# Patient Record
Sex: Male | Born: 1955
Health system: Southern US, Community
[De-identification: ages and names within clinical notes are randomized; demographics above are authoritative.]

## PROBLEM LIST (undated history)

## (undated) ENCOUNTER — Emergency Department (HOSPITAL_BASED_OUTPATIENT_CLINIC_OR_DEPARTMENT_OTHER): Admission: EM | Payer: HMO | Source: Home / Self Care

## (undated) DIAGNOSIS — F419 Anxiety disorder, unspecified: Secondary | ICD-10-CM

## (undated) DIAGNOSIS — I2510 Atherosclerotic heart disease of native coronary artery without angina pectoris: Secondary | ICD-10-CM

## (undated) DIAGNOSIS — E1110 Type 2 diabetes mellitus with ketoacidosis without coma: Secondary | ICD-10-CM

## (undated) DIAGNOSIS — K5900 Constipation, unspecified: Secondary | ICD-10-CM

## (undated) DIAGNOSIS — E119 Type 2 diabetes mellitus without complications: Secondary | ICD-10-CM

## (undated) DIAGNOSIS — H35039 Hypertensive retinopathy, unspecified eye: Secondary | ICD-10-CM

## (undated) DIAGNOSIS — L409 Psoriasis, unspecified: Secondary | ICD-10-CM

## (undated) DIAGNOSIS — E785 Hyperlipidemia, unspecified: Secondary | ICD-10-CM

## (undated) DIAGNOSIS — M1990 Unspecified osteoarthritis, unspecified site: Secondary | ICD-10-CM

## (undated) DIAGNOSIS — F32A Depression, unspecified: Secondary | ICD-10-CM

## (undated) DIAGNOSIS — I509 Heart failure, unspecified: Secondary | ICD-10-CM

## (undated) DIAGNOSIS — E11319 Type 2 diabetes mellitus with unspecified diabetic retinopathy without macular edema: Secondary | ICD-10-CM

## (undated) DIAGNOSIS — H269 Unspecified cataract: Secondary | ICD-10-CM

## (undated) DIAGNOSIS — F329 Major depressive disorder, single episode, unspecified: Secondary | ICD-10-CM

## (undated) DIAGNOSIS — I10 Essential (primary) hypertension: Secondary | ICD-10-CM

## (undated) DIAGNOSIS — I7100 Dissection of unspecified site of aorta: Secondary | ICD-10-CM

## (undated) HISTORY — DX: Type 2 diabetes mellitus with unspecified diabetic retinopathy without macular edema: E11.319

## (undated) HISTORY — DX: Hyperlipidemia, unspecified: E78.5

## (undated) HISTORY — DX: Unspecified osteoarthritis, unspecified site: M19.90

## (undated) HISTORY — DX: Dissection of unspecified site of aorta: I71.00

## (undated) HISTORY — DX: Type 2 diabetes mellitus without complications: E11.9

## (undated) HISTORY — DX: Unspecified cataract: H26.9

## (undated) HISTORY — DX: Type 2 diabetes mellitus with ketoacidosis without coma: E11.10

## (undated) HISTORY — PX: COLONOSCOPY: SHX174

## (undated) HISTORY — DX: Constipation, unspecified: K59.00

## (undated) HISTORY — DX: Atherosclerotic heart disease of native coronary artery without angina pectoris: I25.10

## (undated) HISTORY — DX: Essential (primary) hypertension: I10

## (undated) HISTORY — DX: Hypertensive retinopathy, unspecified eye: H35.039

## (undated) HISTORY — DX: Psoriasis, unspecified: L40.9

## (undated) HISTORY — DX: Depression, unspecified: F32.A

## (undated) HISTORY — DX: Anxiety disorder, unspecified: F41.9

## (undated) HISTORY — PX: ANKLE SURGERY: SHX546

## (undated) HISTORY — DX: Heart failure, unspecified: I50.9

---
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
## 1997-08-12 ENCOUNTER — Other Ambulatory Visit: Admission: RE | Admit: 1997-08-12 | Discharge: 1997-08-12 | Payer: Self-pay | Admitting: Gastroenterology

## 1998-10-03 ENCOUNTER — Ambulatory Visit (HOSPITAL_COMMUNITY): Admission: RE | Admit: 1998-10-03 | Discharge: 1998-10-03 | Payer: Self-pay | Admitting: General Surgery

## 2001-09-25 ENCOUNTER — Inpatient Hospital Stay (HOSPITAL_COMMUNITY): Admission: RE | Admit: 2001-09-25 | Discharge: 2001-10-05 | Payer: Self-pay | Admitting: Cardiology

## 2001-09-25 HISTORY — PX: CARDIAC CATHETERIZATION: SHX172

## 2001-09-26 ENCOUNTER — Encounter: Payer: Self-pay | Admitting: Cardiothoracic Surgery

## 2001-09-30 ENCOUNTER — Encounter: Payer: Self-pay | Admitting: Cardiothoracic Surgery

## 2001-09-30 HISTORY — PX: CORONARY ARTERY BYPASS GRAFT: SHX141

## 2001-10-01 ENCOUNTER — Encounter: Payer: Self-pay | Admitting: Thoracic Surgery (Cardiothoracic Vascular Surgery)

## 2001-10-02 ENCOUNTER — Encounter: Payer: Self-pay | Admitting: Cardiothoracic Surgery

## 2001-10-03 ENCOUNTER — Encounter: Payer: Self-pay | Admitting: Cardiothoracic Surgery

## 2001-11-03 ENCOUNTER — Encounter (HOSPITAL_COMMUNITY): Admission: RE | Admit: 2001-11-03 | Discharge: 2001-12-13 | Payer: Self-pay | Admitting: Cardiology

## 2004-09-24 ENCOUNTER — Emergency Department (HOSPITAL_COMMUNITY): Admission: EM | Admit: 2004-09-24 | Discharge: 2004-09-24 | Payer: Self-pay | Admitting: *Deleted

## 2007-03-04 ENCOUNTER — Emergency Department (HOSPITAL_COMMUNITY): Admission: EM | Admit: 2007-03-04 | Discharge: 2007-03-04 | Payer: Self-pay | Admitting: Emergency Medicine

## 2007-03-05 ENCOUNTER — Emergency Department (HOSPITAL_COMMUNITY): Admission: EM | Admit: 2007-03-05 | Discharge: 2007-03-05 | Payer: Self-pay | Admitting: Emergency Medicine

## 2007-03-06 ENCOUNTER — Emergency Department (HOSPITAL_COMMUNITY): Admission: EM | Admit: 2007-03-06 | Discharge: 2007-03-06 | Payer: Self-pay | Admitting: Emergency Medicine

## 2010-02-01 ENCOUNTER — Encounter (HOSPITAL_COMMUNITY): Payer: Self-pay

## 2010-02-01 ENCOUNTER — Encounter (HOSPITAL_COMMUNITY): Payer: PRIVATE HEALTH INSURANCE

## 2010-02-01 ENCOUNTER — Ambulatory Visit (HOSPITAL_COMMUNITY)
Admission: RE | Admit: 2010-02-01 | Discharge: 2010-02-01 | Disposition: A | Discharge status: Home / Self Care | Payer: PRIVATE HEALTH INSURANCE | Source: Ambulatory Visit | Attending: Surgery | Admitting: Surgery

## 2010-02-01 DIAGNOSIS — K4090 Unilateral inguinal hernia, without obstruction or gangrene, not specified as recurrent: Secondary | ICD-10-CM | POA: Insufficient documentation

## 2010-02-01 LAB — COMPREHENSIVE METABOLIC PANEL WITH GFR
ALT: 14 U/L (ref 0–53)
AST: 16 U/L (ref 0–37)
Albumin: 4.3 g/dL (ref 3.5–5.2)
Alkaline Phosphatase: 50 U/L (ref 39–117)
BUN: 11 mg/dL (ref 6–23)
CO2: 28 meq/L (ref 19–32)
Calcium: 9.6 mg/dL (ref 8.4–10.5)
Chloride: 95 meq/L — ABNORMAL LOW (ref 96–112)
Creatinine, Ser: 0.72 mg/dL (ref 0.4–1.5)
GFR calc non Af Amer: >60 mL/min
Glucose, Bld: 325 mg/dL — ABNORMAL HIGH (ref 70–99)
Potassium: 4.6 meq/L (ref 3.5–5.1)
Sodium: 133 meq/L — ABNORMAL LOW (ref 135–145)
Total Bilirubin: 1.2 mg/dL (ref 0.3–1.2)
Total Protein: 7.3 g/dL (ref 6.0–8.3)

## 2010-02-01 LAB — SURGICAL PCR SCREEN

## 2010-02-03 LAB — MRSA CULTURE: Culture: NO GROWTH

## 2010-02-09 ENCOUNTER — Ambulatory Visit (HOSPITAL_COMMUNITY)
Admission: RE | Admit: 2010-02-09 | Discharge: 2010-02-09 | Disposition: A | Discharge status: Home / Self Care | Payer: PRIVATE HEALTH INSURANCE | Source: Ambulatory Visit | Attending: Surgery | Admitting: Surgery

## 2010-02-09 DIAGNOSIS — E119 Type 2 diabetes mellitus without complications: Secondary | ICD-10-CM | POA: Insufficient documentation

## 2010-02-09 DIAGNOSIS — K4020 Bilateral inguinal hernia, without obstruction or gangrene, not specified as recurrent: Secondary | ICD-10-CM | POA: Insufficient documentation

## 2010-02-09 DIAGNOSIS — I2510 Atherosclerotic heart disease of native coronary artery without angina pectoris: Secondary | ICD-10-CM | POA: Insufficient documentation

## 2010-02-09 DIAGNOSIS — Z951 Presence of aortocoronary bypass graft: Secondary | ICD-10-CM | POA: Insufficient documentation

## 2010-02-09 DIAGNOSIS — I10 Essential (primary) hypertension: Secondary | ICD-10-CM | POA: Insufficient documentation

## 2010-02-09 HISTORY — PX: LAPAROSCOPIC INGUINAL HERNIA REPAIR: SUR788

## 2010-02-15 NOTE — Op Note (Signed)
NAMEHIEU, HERMS NO.:  [id number]  MEDICAL RECORD NO.:  [id number]           PATIENT TYPE:  O  LOCATION:  DAYL                         FACILITY:  [facility]  PHYSICIAN:  Abigail Miyamoto, M.D. DATE OF BIRTH:  [birth year]  DATE OF PROCEDURE:  02/09/2010 DATE OF DISCHARGE:                              OPERATIVE REPORT   PREOPERATIVE DIAGNOSIS:  Right inguinal hernia.  POSTOPERATIVE DIAGNOSIS:  Bilateral inguinal hernias.  PROCEDURE:  Laparoscopic bilateral inguinal hernia repair with mesh.  SURGEON:  Abigail Miyamoto, MD  ANESTHESIA:  General and 0.5% Marcaine.  ESTIMATED BLOOD LOSS:  Minimal.  FINDINGS:  The patient found to have bilateral direct inguinal hernias.  PROCEDURE IN DETAIL:  The patient brought to the operative room, identified as Allayne Butcher.  He was placed on the operating table and general anesthesia was induced.  His abdomen was prepped and draped in usual sterile fashion.  Using a #15 blade, a small vertical incision was made below the umbilicus.  This was carried down through the fascia. I then opened the fascia just to the right of midline with a scalpel. The rectus muscle was identified and elevated.  The dissecting balloon was then passed underneath the rectus muscle made towards the pelvis.  I then insufflated dissecting balloon, dissecting out the preperitoneal space under direct vision.  Good dissection with the dissecting balloon appeared to be achieved.  The dissecting balloon was then removed and insufflation with carbon dioxide was begun.  I then placed two 5 mm ports in the patient's lower midline under direct vision.  I first turned my attention towards the right inguinal floor.  The patient was found to have a small direct hernia defect.  There was minimal peritoneum going into the testicular cord.  I was able to reduce all the peritoneum and identify the cord well.  I was also able to easily identify Cooper ligament.   Before placing the mesh inside to evaluate his left inguinal floor and after dissecting out the area, I was able to identify a direct left inguinal area as well.  I evaluated cord structures and found no evidence of indirect hernia.  I could identify the peritoneum which I made sure was reduced from the cord.  Also I was able to identify Cooper ligament as well.  Next, I brought 2 separate 6 x 6-inch pieces of Ultrapro Prolene mesh on the field.  I placed the first piece through the umbilical port and then placed in onlay fashion on the left inguinal floor.  I then tacked it with the 3 Securestrap absorbable tacker to Cooper ligament up the medial abdominal wall and out laterally.  I then brought the second piece of mesh on to the field and cut it appropriate size.  I placed it through the umbilical port and then opened in onlay fashion on the right inguinal floor.  I then tacked it to Cooper's ligament with Securestrap tacker beneath abdominal wall and out laterally as well.  Good coverage of both inguinal areas and hernia defects appeared to be achieved.  Hemostasis also appeared to be achieved.  I removed the ports at the  midline and watched the preperitoneal space collapse appropriately.  I then removed the port of the umbilicus.  The fascia of umbilicus was then closed with figure-of- eight 0 Vicryl suture.  All incisions were then anesthetized with 0.5% Marcaine.  I performed ilioinguinal nerve blocks bilaterally with Marcaine as well.  All skin incisions were closed with 4-0 Monocryl sutures.  Steri-Strips and Band-Aids were applied.  The patient tolerated the procedure well.  All counts were correct at the end of procedure.  The patient was then extubated in operating room and taken in stable condition to recovery room.     Abigail Miyamoto, M.D.     DB/MEDQ  D:  02/09/2010  T:  02/09/2010  Job:  [id number]  Electronically Signed by Abigail Miyamoto M.D. on 02/15/2010  07:29:31 PM

## 2010-05-19 NOTE — Discharge Summary (Signed)
NAME:  [name] [name], [name] [name] NO.:  [id number]   MEDICAL RECORD NO.:  [id number]                   PATIENT TYPE:  INP   LOCATION:  2008                                 FACILITY:  [facility]   PHYSICIAN:  Gwenith Daily. Tyrone Sage, M.D.            DATE OF BIRTH:  [birth year]   DATE OF ADMISSION:  09/25/2001  DATE OF DISCHARGE:  10/05/2001                                 DISCHARGE SUMMARY   ADMISSION DIAGNOSIS:  Unstable angina.   PAST MEDICAL HISTORY:  1. Poorly controlled diabetes mellitus type 2.  2. Hyperlipidemia.   PAST SURGICAL HISTORY:  Ankle surgery x 2.   ALLERGIES:  No known drug allergies.   DISCHARGE DIAGNOSES:  Unstable angina with three-vessel coronary artery  disease, status post coronary artery bypass graft.   HISTORY OF PRESENT ILLNESS:  The patient is a 55 year old Caucasian  man. He  was  referred to Dr. Swaziland for a several week history of symptoms of  shortness of breath with exertion and chest tightness. He was evaluated in  Dr. Elvis Coil office on September 10, 2001, and his recommendation was to  proceed with a Cardiolite stress test. This was performed on September 25, 2001. He developed chest pain, ST changes and hypokinesia of the anterior  septum was evident. Dr. Elvis Coil recommendation was to admit to [facility] for cardiac catheterization.   HOSPITAL COURSE:  On September 25, 2001, the patient was admitted to  [facility] hospital under the care of Dr. Peter Swaziland. He underwent a  cardiac catheterization with the following results:  1. Severe complex three-vessel coronary artery disease.  2. Normal LV function.  As his lesions were not amenable to PTCA, a CVTS consult was obtained. He  was  evaluated later in that day by Dr. Sheliah Plane. After examining  this patient and review of the records including catheterization films, Dr.  Tyrone Sage recommended coronary artery bypass graft for treatment for this  gentleman. The procedure risks and benefits were discussed with the patient  and he agreed to proceed. Of  note prior to Dr. Dennie Maizes evaluation, the  patient had received three doses of Plavix.   On September 28, 2001, Doppler studies were performed which revealed no  significant carotid artery disease. His upper extremity  arterial evaluation  was within normal limits and his ABIs were noted to be greater than 1.0  bilaterally.   On September 30, 2001, the patient underwent an uncomplicated coronary  artery bypass grafting x 4 with Dr. Sheliah Plane. The grafts placed  during the procedure were  left internal mammary artery to the left anterior  descending artery, the right internal mammary artery to the right coronary  artery, saphenous vein graft to the diagonal artery, the radial artery was  grafted to the circumflex artery. Conduit bypasses included the left radial  artery and the  right lower extremity saphenous  vein.   The patient tolerated this procedure reasonably well and was transferred in  stable condition to the SICU. He remained hemodynamically stable from  surgery and his postoperative course has been uneventful.   Postoperatively his diabetes mellitus, his admitting hemoglobin A1C 10.7. A  Glucomander protocol was used in the immediate postoperative period. Sliding  scale insulin and Amaryl were restarted after postoperative day #1. His  postoperative CBGs were in the 150s to 100 range. Metformin was restarted on  October 03, 2001, postoperative day #3.  His volume overload responded to  diuretics.   On the morning of October 04, 2001, postoperative day #4, the patient reports  feeling very well. His vital signs were stable. His heart is in normal sinus  rhythm. His lungs are clear. He is tolerating a diabetic diet. His bowel and  bladder functions were within normal limits for him. His incisions are  healing well. He is ambulating in the hall with minimal  assistance. His pain  control was adequate. It is anticipated that the patient will be ready for  discharge home tomorrow, October 05, 2001.   LABORATORY DATA:  Recent laboratory studies on October 04, 2001:  WBCs 5.1,  hemoglobin 8.3, hematocrit 24.5, platelets 174. On October 03, 2001:  Sodium  134, potassium 3.7, chloride 94, CO2 34, BUN 8, creatinine 0.7, glucose 160.   CONDITION ON DISCHARGE:  Improved.   DISCHARGE MEDICATIONS:  1. Tylox 1 to  2 tablets q.4-6h. p.r.n. pain.  2. Toprol XL 25 mg p.o. q.d.  3. Lasix 40 mg p.o. q.d.  x 5 days.  4. Potassium chloride  20 mEq p.o. q.d. x 5 days.  5. Metformin 500 mg p.o. b.i.d.  6. Folic acid 1 mg p.o. q.d.  He has been instructed to resume the following home medications:  1. Aspirin 325 mg p.o. q.d.  2. Lipitor 20 mg p.o. q.d.  3. Amaryl 4 mg p.o. q.d.   DISCHARGE INSTRUCTIONS:  Activity, he has been asked to refrain from any  driving or any heavy lifting, pushing or pulling. He has been instructed to  continue his breathing exercises and daily walking. His diet should be a  diabetic diet. Wound care, he may shower with mild soap and water. If the  incisions become red, hot, swollen, draining or if  he has a fever greater  than 101 degrees  Farenheit, he is to call Dr. Dennie Maizes office.  Special  instructions, he is reminded to continue to check his sugars daily and  record.   FOLLOW UP:  1. Dr. Swaziland would like to see him in his office in approximately two weeks     and have a chest x-ray at that time. He has been asked to call     [facility] at [phone] to arrange this appointment.  2. He will have and appointment to see Dr. Tyrone Sage at the CVTS office in     approximately three weeks. The office will call with a date and time for     that appointment. He has been asked to bring his chest x-ray with him to     that     appointment. 3. He has also been recommended to follow up with Dr. Almedia Balls within one      month, specifically for followup of his diabetes.     Toribio Harbour, R.N.                  Gwenith Daily. Tyrone Sage,  M.D.    CTK/MEDQ  D:  10/04/2001  T:  10/08/2001  Job:  [id number]   cc:   Sam Kelly   Peter M. Swaziland, M.D.  [address]  [place], [place] [address]  Fax: [phone]

## 2010-05-19 NOTE — Op Note (Signed)
NAME:  WELCOME, FULTS NO.:  [id number]   MEDICAL RECORD NO.:  [id number]                   PATIENT TYPE:  INP   LOCATION:  2310                                 FACILITY:  [facility]   PHYSICIAN:  Gwenith Daily. Tyrone Sage, M.D.            DATE OF BIRTH:  02/10/1955   DATE OF PROCEDURE:  09/30/2001  DATE OF DISCHARGE:                                 OPERATIVE REPORT   PREOPERATIVE DIAGNOSIS:  Unstable angina with three-vessel coronary artery  disease.   POSTOPERATIVE DIAGNOSIS:  Unstable angina with three-vessel coronary artery  disease.   PROCEDURE:  Coronary artery bypass grafting x4 with the left internal  mammary to the left anterior descending coronary artery, right internal  mammary as a pedicle graft to the distal right coronary artery, left radial  artery graft to the circumflex coronary artery, reversed saphenous vein  graft to the first diagonal coronary artery.   SURGEON:  Gwenith Daily. Tyrone Sage, M.D.   ASSISTANT:  Levin Erp. Steward, P.A.   BRIEF HISTORY:  The patient is a 55 year old male with poorly-controlled  diabetes, who presented with unstable anginal symptoms.  He underwent  cardiac catheterization by Peter M. Swaziland, M.D., which demonstrated  significant three-vessel coronary artery disease including total occlusion  of the LAD, diffusely diseased right coronary artery with collateral filling  to the LAD, a high-grade stenosis of the first diagonal, a small second  diagonal with diffuse disease, and a circumflex with 60% proximal disease.  Overall ventricular function was preserved.  Because of the patient's  significant three-vessel coronary artery disease, coronary artery bypass  grafting was recommended.   DESCRIPTION OF PROCEDURE:  With Swan-Ganz and arterial line monitors in  place, the patient underwent general endotracheal anesthesia.  Preoperatively Allen's test had been checked bilaterally.  I decided to use  the patient's left  radial artery.  This was harvested, taking care to avoid  any neural injury.  The radial artery was of excellent quality and caliber.  The small perforating branches were clipped and divided.  The vessel was  hydrostatically dilated with heparinized saline and removed.  The incision  was closed with several interrupted sutures in the fascia, running 2-0  Vicryl on the subcutaneous tissue, and a 4-0 subcuticular stitch in the skin  edges.  A segment of vein was harvested from the right lower extremity and  was of good quality and caliber.  A median sternotomy was preformed.  Both  the left and the right internal mammary arteries were dissected down as  pedicle grafts and had excellent free flow.  The pericardium was opened.  The patient was systemically heparinized.  The ascending aorta and the right  atrium were cannulated and the aortic root vent cardioplegia needle was  introduced into the ascending aorta.  The patient was placed on  cardiopulmonary bypass 2.4 L/min. per sq. m, sites of anastomosis were  selected and dissected  out of the epicardium.  The patient's body  temperature was cooled to 30 degrees, the aortic crossclamp was applied, and  500 cc of cold blood potassium cardioplegia was administered with rapid  diastolic arrest of the heart.  Myocardial septal temperature was monitored  throughout the crossclamp period.  Attention was turned first to the  circumflex coronary artery, which was opened and was at least 1.5 mm in  size.  Using a running 8-0 Prolene, the radial artery was anastomosed to the  circumflex coronary artery.  Attention was then turned to the first diagonal  coronary artery, which was opened and was 1.2-1.3 mm in size.  Using a  running 7-0 Prolene, distal anastomosis was performed.  Additional cold  blood cardioplegia was administered down the vein graft.  Attention was then  turned to the distal right coronary artery, which was slightly thickened  just at  the takeoff of the posterior descending.  The coronary artery was  opened.  Using a running 8-0 Prolene suture, the right internal mammary as a  pedicle graft was anastomosed to the distal right coronary artery.  Fascia  was tacked to the epicardium.  Attention was then turned to the left  anterior descending coronary artery, which was opened in the midportion.  Using a running 8-0 Prolene, the left internal mammary artery was  anastomosed to the left anterior descending coronary artery.  With release  of the Edwards bulldog on the mammary artery, there was appropriate rise in  myocardial septal temperature and the bulldog was removed from the right  mammary artery with prompt filling of the inferior surface of the heart.  The aortic crossclamp was removed.  Total crossclamp time was 62 minutes.  The patient required electrical defibrillation and returned to a sinus  rhythm.  The partial occlusion clamp was placed on the ascending aorta.  Two  punch aortotomies were performed and each of the two vein grafts anastomosed  to the ascending aorta.  Air was evacuated from the grafts.  The partial  occlusion clamp was removed.  Sites of anastomosis were inspected and were  free of bleeding.  The patient was then ventilated, weaned from  cardiopulmonary bypass without difficulty, and remained hemodynamically  stable.  He was decannulated in the usual fashion.  Protamine sulfate was  administered with the operative field hemostatic.  Two atrial and two  ventricular pacing wires were applied, and graft markers were applied.  A  left pleural tube and two mediastinal tubes left in place.  Sternum was  closed with #6 stainless steel wire.  The fascia closed with interrupted 0  Vicryl, running 3-0 Vicryl in the subcutaneous tissue, 4-0 subcuticular  stitch in the skin edges.  Dry dressings were applied, sponge and needle count was reported as correct at the completion of the procedure.  The  patient  tolerated the procedure without obvious complication and was  transferred to the surgical intensive care unit for further postoperative  care.                                               Gwenith Daily Tyrone Sage, M.D.    Tyson Babinski  D:  09/30/2001  T:  10/01/2001  Job:  [id number]   cc:   Peter M. Swaziland, M.D.  [address]  [place], [place] [address]  Fax: [phone]  Dr. Tresa Endo, [facility]

## 2010-05-19 NOTE — Cardiovascular Report (Signed)
NAME:  [name] [name], [name] [name] NO.:  [id number]   MEDICAL RECORD NO.:  [id number]                   PATIENT TYPE:  INP   LOCATION:  2034                                 FACILITY:  [facility]   PHYSICIAN:  Peter M. Swaziland, M.D.               DATE OF BIRTH:  1955/01/22   DATE OF PROCEDURE:  DATE OF DISCHARGE:                              CARDIAC CATHETERIZATION   INDICATIONS FOR PROCEDURE:  The patient is a 55 year old white male with  history of diabetes mellitus, hypercholesterolemia, persistent symptoms of  angina pectoris.  A stress echocardiography evaluation is positive for  anterior septal ischemia.   ACCESS:  Access will be the right femoral artery using standard Seldinger  technique.   EQUIPMENT:  6 [place], 4 cm Triton left Judkins'  catheter.  6 French pigtail  catheter, 6 French arterial sheath.   MEDICATIONS:  Local anesthesia, 1% Xylocaine, Versed 2 mg intravenously.   CONTRAST:  150 cc Omnipaque.   HEMODYNAMIC DATA:  Aortic pressure is 121/82 with mean of 101.  Left  ventricular pressure was 125 with STP at 15 mmHg.   ANGIOGRAPHIC DATA:  The left coronary artery rises and distributes normally.  The left main coronary artery has minor irregularities distally,  approximately 10%.   The left anterior descending artery rises and distributes normally.  There  is moderate calcification proximally.  There is a 70% ostial left anterior  descending stenosis followed by aneurysmal segment.  This is then followed  by 99% subtotal occlusion in the left anterior descending past the first  diagonal.  There is a 50% stenosis in the mid left anterior descending at  the takeoff of the second diagonal.  The distal left anterior descending  does not fill well with TIMI grade I flow.  The first diagonal has a 70%  stenosis at its takeoff.  The second diagonal has a 50 to 60% stenosis at  the takeoff.   The left circumflex coronary artery gives rise to a single  large marginal  branch.  There is a 50% stenosis in the mid left circumflex coronary artery.   The right coronary artery rises and distributes normally.  It is a dominant  vessel.  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  There is a focal 90% stenosis in the mid vessel followed by long  segmental 80% stenosis in the mid vessel.  The right coronary does supply  excellent collateral flow to the left anterior descending.   A left ventricular angiography was performed in the RAO view.  This  demonstrates normal left ventricular size and contractility with normal  systolic function.  The ejection fraction is estimated at 70%.  There is no  mitral regurgitation or prolapse.    FINAL INTERPRETATION:  1. Severe three vessel obstructive atherosclerotic coronary artery disease.  2. Normal left ventricular function.   PLAN:  Would recommend coronary artery bypass grafting.                                               Peter M. Swaziland, M.D.    PMJ/MEDQ  D:  09/25/2001  T:  09/29/2001  Job:  [id number]   cc:   Rolanda Jay Tyrone Sage, M.D.  [address]  [place]  [place] [address]  Fax: [phone]

## 2010-05-19 NOTE — H&P (Signed)
NAME:  [name] [name], [name] [name] NO.:  [id number]   MEDICAL RECORD NO.:  [id number]                   PATIENT TYPE:  OIB   LOCATION:  2770                                 FACILITY:  [facility]   PHYSICIAN:  Peter M. Swaziland, M.D.               DATE OF BIRTH:  09-03-1955   DATE OF ADMISSION:  10/03/1998  DATE OF DISCHARGE:  10/03/1998                                HISTORY & PHYSICAL   HISTORY OF PRESENT ILLNESS:  The patient is a 55 year old white male seen  for evaluation of chest discomfort.  He noticed over the last 4 weeks that  he has had symptoms of shortness of breath on exertion and sometimes with  fairly minimal exertion, also associated with symptoms of chest tightness.  He has noticed this intermittently in the past when he was on a back packing  trip and carrying a lot of weight.  He had some chest discomfort.  He denied  any radiation of symptoms, it is relieved with rest.  He has had no symptoms  at rest.  He denies diaphoresis, nausea or vomiting.  He does have a history  of hypercholesterolemia, diabetes mellitus type II and a family history of  heart disease.  The patient was seen and evaluated with a stress  echocardiogram study.  He was able to walk 8 minutes and 30 seconds under  Bruce protocol, but did develop typical angina symptoms.  He had ECG changes  of inferolateral ischemia with 1.5 mm of ST segment depression.  His  echocardiographic images demonstrated development of hypokinesis of the  anterior septum consistent with ischemia.  For this reason the patient was  recommended to under cardiac catheterization.   PAST MEDICAL HISTORY:  Significant for severe hypercholesterolemia.  He also  has type II diabetes mellitus that has been poorly controlled.  He has had  previous ankle surgery times 2.   ALLERGIES:  NO KNOWN DRUG ALLERGIES.   CURRENT MEDICATIONS:  Lipitor 20 mg per day, Lexapro 10 mg q.d., Aspirin 81  mg q.d., Metformin  apparently 1500 mg b.i.d.  The patient is unclear about  his dose.   SOCIAL HISTORY:  The patient works as an Art gallery manager for [employer], English as a foreign language.  He quit smoking 16 years ago.  He drinks 2-3 glasses of wine per  week.  He is divorced and has 4 children.   FAMILY HISTORY:  Father is age 53 and has had previous angioplasty at age  86, also has a history of hypertension, mother is age 70 in good health.  He  has 4 siblings in good health.   REVIEW OF SYSTEMS:  Otherwise unremarkable.  The patient does complain of  feeling fatigued a lot.  All other review of systems are negative.   PHYSICAL EXAMINATION:  GENERAL:  The patient is a young white male in no  apparent distress.  VITAL SIGNS:  Weight 220, blood pressure 124/88, pulse 76 and regular.  HEENT:  Pupils are equal, round, reactive to light and accommodation.  Extraocular movements are intact.  Oropharynx is clear.  NECK:  Supple without jugular venous distention, adenopathy, thyromegaly or  bruits.  LUNGS:  Clear to auscultation and percussion.  CARDIAC:  Reveals regular rate and rhythm.  Normal S1 and S2 without  gallops, murmurs, rubs or clicks.  ABDOMEN:  Soft and nontender.  He has no hepatosplenography masses or  bruits.  Femoral and pedal pulses are 2+ and symmetric.  He has no edema.  NEUROLOGIC:  Nonfocal.   LABORATORY DATA:  Resting ECG shows normal sinus rhythm with normal ECG.  Chest x-ray showed no active disease.  Recent lipid panel showed a  cholesterol of 165, triglycerides of 180, HDL 40, LDL 89, thyroid functions  were normal.  Hemoglobin A1C was 10.7%.   IMPRESSION:  1. Angina pectoris with abnormal stress echocardiogram study suggesting     anteroseptal ischemia.  2. Diabetes mellitus type II.  3. Hypercholesterolemia.  4. Family history of coronary disease.   PLAN:  The patient will be admitted for cardiac catheterization with further  therapy pending these results.                                                Peter M. Swaziland, M.D.    PMJ/MEDQ  D:  09/23/2001  T:  09/25/2001  Job:  [id number]   cc:   Almedia Balls

## 2010-09-25 LAB — BASIC METABOLIC PANEL WITH GFR
Chloride: 102
GFR calc non Af Amer: >60
Glucose, Bld: 218 — ABNORMAL HIGH
Potassium: 3.5
Sodium: 136

## 2010-09-25 LAB — BASIC METABOLIC PANEL
BUN: 4 — ABNORMAL LOW
CO2: 26
Calcium: 8.9
Creatinine, Ser: 0.7
GFR calc Af Amer: >60

## 2010-09-25 LAB — DIFFERENTIAL
Basophils Absolute: 0
Basophils Relative: 0
Eosinophils Absolute: 0
Eosinophils Relative: 1
Lymphocytes Relative: 12
Lymphs Abs: 1
Monocytes Absolute: 0.8
Monocytes Relative: 10
Neutro Abs: 6.5
Neutrophils Relative %: 77

## 2010-09-25 LAB — CBC
HCT: 39.6
Hemoglobin: 13.7
MCHC: 34.7
MCV: 95.6
Platelets: 275
RBC: 4.14 — ABNORMAL LOW
RDW: 13.8
WBC: 8.4

## 2010-11-01 DIAGNOSIS — M720 Palmar fascial fibromatosis [Dupuytren]: Secondary | ICD-10-CM | POA: Insufficient documentation

## 2010-11-01 DIAGNOSIS — M24549 Contracture, unspecified hand: Secondary | ICD-10-CM | POA: Insufficient documentation

## 2011-02-27 DIAGNOSIS — E113299 Type 2 diabetes mellitus with mild nonproliferative diabetic retinopathy without macular edema, unspecified eye: Secondary | ICD-10-CM | POA: Insufficient documentation

## 2011-02-27 DIAGNOSIS — E11319 Type 2 diabetes mellitus with unspecified diabetic retinopathy without macular edema: Secondary | ICD-10-CM | POA: Insufficient documentation

## 2012-05-19 DIAGNOSIS — Z8679 Personal history of other diseases of the circulatory system: Secondary | ICD-10-CM | POA: Insufficient documentation

## 2012-05-19 DIAGNOSIS — Z951 Presence of aortocoronary bypass graft: Secondary | ICD-10-CM | POA: Insufficient documentation

## 2013-01-16 ENCOUNTER — Encounter (INDEPENDENT_AMBULATORY_CARE_PROVIDER_SITE_OTHER): Payer: Self-pay

## 2013-01-20 ENCOUNTER — Ambulatory Visit (INDEPENDENT_AMBULATORY_CARE_PROVIDER_SITE_OTHER): Payer: 59 | Admitting: General Surgery

## 2013-01-20 ENCOUNTER — Encounter (INDEPENDENT_AMBULATORY_CARE_PROVIDER_SITE_OTHER): Payer: Self-pay

## 2013-01-20 ENCOUNTER — Encounter (INDEPENDENT_AMBULATORY_CARE_PROVIDER_SITE_OTHER): Payer: Self-pay | Admitting: General Surgery

## 2013-01-20 VITALS — BP 118/76 | HR 71 | Temp 98.6°F | Resp 16 | Ht 74.0 in | Wt 202.4 lb

## 2013-01-20 DIAGNOSIS — R1031 Right lower quadrant pain: Secondary | ICD-10-CM

## 2013-01-20 NOTE — Progress Notes (Signed)
The patient comes in today complaining of right groin pain that seems to get worse as the day goes on. It also seems to get worse when he exerts himself or does any significant lifting.  The patient is status post bilateral laparoscopic inguinal hernia repairs by quantity of the surgeons in our group. The patient was not adverse to seeing a surgeon however an appointment was not given for that surgeon.  On examination today the patient does not have a recurrent hernia. He has some discomfort in the right lower quadrant in that area but no palpable hernia. I believe this represents likely musculoskeletal strain and can be treated mostly with rest and over-the-counter pain medication.  If her return visit as necessary he would most likely be important for him to see his primary surgeon.

## 2013-01-23 ENCOUNTER — Other Ambulatory Visit: Payer: Self-pay | Admitting: Orthopedic Surgery

## 2013-03-06 ENCOUNTER — Encounter (HOSPITAL_BASED_OUTPATIENT_CLINIC_OR_DEPARTMENT_OTHER): Payer: Self-pay | Admitting: *Deleted

## 2013-03-09 ENCOUNTER — Encounter (HOSPITAL_BASED_OUTPATIENT_CLINIC_OR_DEPARTMENT_OTHER): Payer: Self-pay | Admitting: *Deleted

## 2013-03-09 NOTE — Progress Notes (Signed)
To come in for bmet-will ck on ekg-had cabg 2003-released dr jordon several yr ago-no heart problems since

## 2013-03-11 ENCOUNTER — Encounter (HOSPITAL_BASED_OUTPATIENT_CLINIC_OR_DEPARTMENT_OTHER)
Admission: RE | Admit: 2013-03-11 | Discharge: 2013-03-11 | Disposition: A | Discharge status: Home / Self Care | Payer: 59 | Source: Ambulatory Visit | Attending: Orthopedic Surgery | Admitting: Orthopedic Surgery

## 2013-03-11 LAB — BASIC METABOLIC PANEL
BUN: 13 mg/dL (ref 6–23)
CO2: 22 mEq/L (ref 19–32)
Calcium: 9.2 mg/dL (ref 8.4–10.5)
Chloride: 94 mEq/L — ABNORMAL LOW (ref 96–112)
Creatinine, Ser: 0.55 mg/dL (ref 0.50–1.35)
Glucose, Bld: 493 mg/dL — ABNORMAL HIGH (ref 70–99)
POTASSIUM: 4.9 meq/L (ref 3.7–5.3)
Sodium: 132 mEq/L — ABNORMAL LOW (ref 137–147)

## 2013-03-12 ENCOUNTER — Encounter (HOSPITAL_BASED_OUTPATIENT_CLINIC_OR_DEPARTMENT_OTHER): Payer: Self-pay | Admitting: Anesthesiology

## 2013-03-12 ENCOUNTER — Ambulatory Visit (HOSPITAL_BASED_OUTPATIENT_CLINIC_OR_DEPARTMENT_OTHER): Payer: 59 | Admitting: Anesthesiology

## 2013-03-12 ENCOUNTER — Ambulatory Visit (HOSPITAL_BASED_OUTPATIENT_CLINIC_OR_DEPARTMENT_OTHER)
Admission: RE | Admit: 2013-03-12 | Discharge: 2013-03-12 | Disposition: A | Discharge status: Home / Self Care | Payer: 59 | Source: Ambulatory Visit | Attending: Orthopedic Surgery | Admitting: Orthopedic Surgery

## 2013-03-12 ENCOUNTER — Encounter (HOSPITAL_BASED_OUTPATIENT_CLINIC_OR_DEPARTMENT_OTHER)
Admission: RE | Disposition: A | Discharge status: Home / Self Care | Payer: Self-pay | Source: Ambulatory Visit | Attending: Orthopedic Surgery

## 2013-03-12 ENCOUNTER — Encounter (HOSPITAL_BASED_OUTPATIENT_CLINIC_OR_DEPARTMENT_OTHER): Payer: 59 | Admitting: Anesthesiology

## 2013-03-12 DIAGNOSIS — I10 Essential (primary) hypertension: Secondary | ICD-10-CM | POA: Insufficient documentation

## 2013-03-12 DIAGNOSIS — M6740 Ganglion, unspecified site: Secondary | ICD-10-CM | POA: Insufficient documentation

## 2013-03-12 DIAGNOSIS — E785 Hyperlipidemia, unspecified: Secondary | ICD-10-CM | POA: Insufficient documentation

## 2013-03-12 DIAGNOSIS — I2510 Atherosclerotic heart disease of native coronary artery without angina pectoris: Secondary | ICD-10-CM | POA: Insufficient documentation

## 2013-03-12 DIAGNOSIS — E119 Type 2 diabetes mellitus without complications: Secondary | ICD-10-CM | POA: Insufficient documentation

## 2013-03-12 DIAGNOSIS — Z951 Presence of aortocoronary bypass graft: Secondary | ICD-10-CM | POA: Insufficient documentation

## 2013-03-12 DIAGNOSIS — Z794 Long term (current) use of insulin: Secondary | ICD-10-CM | POA: Insufficient documentation

## 2013-03-12 DIAGNOSIS — Z7982 Long term (current) use of aspirin: Secondary | ICD-10-CM | POA: Insufficient documentation

## 2013-03-12 HISTORY — PX: MASS EXCISION: SHX2000

## 2013-03-12 LAB — GLUCOSE, CAPILLARY
GLUCOSE-CAPILLARY: 359 mg/dL — AB (ref 70–99)
GLUCOSE-CAPILLARY: 123 mg/dL — AB (ref 70–99)
Glucose-Capillary: 309 mg/dL — ABNORMAL HIGH (ref 70–99)
Glucose-Capillary: 299 mg/dL — ABNORMAL HIGH (ref 70–99)

## 2013-03-12 LAB — POCT HEMOGLOBIN-HEMACUE: HEMOGLOBIN: 17.8 g/dL — AB (ref 13.0–17.0)

## 2013-03-12 SURGERY — EXCISION MASS
Anesthesia: Monitor Anesthesia Care | Site: Finger | Laterality: Left

## 2013-03-12 MED ORDER — CEFAZOLIN SODIUM-DEXTROSE 2-3 GM-% IV SOLR: 2.0000 g | INTRAVENOUS | Status: DC

## 2013-03-12 MED ORDER — INSULIN ASPART 100 UNIT/ML ~~LOC~~ SOLN
SUBCUTANEOUS | Status: AC
  Filled 2013-03-12: qty 1

## 2013-03-12 MED ORDER — PROMETHAZINE HCL 25 MG/ML IJ SOLN: 6.2500 mg | INTRAMUSCULAR | Status: DC | PRN

## 2013-03-12 MED ORDER — INSULIN ASPART 100 UNIT/ML ~~LOC~~ SOLN
26.0000 [IU] | Freq: Once | SUBCUTANEOUS | Status: AC
  Administered 2013-03-12: 26 [IU] via SUBCUTANEOUS

## 2013-03-12 MED ORDER — CEFAZOLIN SODIUM-DEXTROSE 2-3 GM-% IV SOLR
INTRAVENOUS | Status: AC
  Filled 2013-03-12: qty 50

## 2013-03-12 MED ORDER — FENTANYL CITRATE 0.05 MG/ML IJ SOLN: 50.0000 ug | INTRAMUSCULAR | Status: DC | PRN

## 2013-03-12 MED ORDER — CHLORHEXIDINE GLUCONATE 4 % EX LIQD: 60.0000 mL | Freq: Once | CUTANEOUS | Status: DC

## 2013-03-12 MED ORDER — MIDAZOLAM HCL 5 MG/5ML IJ SOLN
INTRAMUSCULAR | Status: DC | PRN
  Administered 2013-03-12: 2 mg via INTRAVENOUS

## 2013-03-12 MED ORDER — OXYCODONE HCL 5 MG/5ML PO SOLN: 5.0000 mg | Freq: Once | ORAL | Status: DC | PRN

## 2013-03-12 MED ORDER — LACTATED RINGERS IV SOLN
INTRAVENOUS | Status: DC
  Administered 2013-03-12 (×2): via INTRAVENOUS

## 2013-03-12 MED ORDER — CEFAZOLIN SODIUM-DEXTROSE 2-3 GM-% IV SOLR
INTRAVENOUS | Status: DC | PRN
  Administered 2013-03-12: 2 g via INTRAVENOUS

## 2013-03-12 MED ORDER — HYDROMORPHONE HCL PF 1 MG/ML IJ SOLN: 0.2500 mg | INTRAMUSCULAR | Status: DC | PRN

## 2013-03-12 MED ORDER — OXYCODONE HCL 5 MG PO TABS: 5.0000 mg | ORAL_TABLET | Freq: Once | ORAL | Status: DC | PRN

## 2013-03-12 MED ORDER — FENTANYL CITRATE 0.05 MG/ML IJ SOLN
INTRAMUSCULAR | Status: DC | PRN
  Administered 2013-03-12 (×2): 25 ug via INTRAVENOUS

## 2013-03-12 MED ORDER — BUPIVACAINE HCL (PF) 0.25 % IJ SOLN
INTRAMUSCULAR | Status: AC
  Filled 2013-03-12: qty 30

## 2013-03-12 MED ORDER — FENTANYL CITRATE 0.05 MG/ML IJ SOLN
INTRAMUSCULAR | Status: AC
Start: 2013-03-12 — End: 2013-03-12
  Filled 2013-03-12: qty 4

## 2013-03-12 MED ORDER — HYDROCODONE-ACETAMINOPHEN 5-325 MG PO TABS: ORAL_TABLET | ORAL | Status: DC

## 2013-03-12 MED ORDER — MIDAZOLAM HCL 2 MG/2ML IJ SOLN
INTRAMUSCULAR | Status: AC
  Filled 2013-03-12: qty 2

## 2013-03-12 MED ORDER — MIDAZOLAM HCL 2 MG/2ML IJ SOLN: 1.0000 mg | INTRAMUSCULAR | Status: DC | PRN

## 2013-03-12 MED ORDER — ONDANSETRON HCL 4 MG/2ML IJ SOLN
INTRAMUSCULAR | Status: DC | PRN
  Administered 2013-03-12: 4 mg via INTRAVENOUS

## 2013-03-12 MED ORDER — SULFAMETHOXAZOLE-TRIMETHOPRIM 800-160 MG PO TABS: 1.0000 | ORAL_TABLET | Freq: Two times a day (BID) | ORAL | Status: DC

## 2013-03-12 MED ORDER — BUPIVACAINE HCL (PF) 0.25 % IJ SOLN
INTRAMUSCULAR | Status: DC | PRN
  Administered 2013-03-12: 10 mL

## 2013-03-12 MED ORDER — PROPOFOL INFUSION 10 MG/ML OPTIME
INTRAVENOUS | Status: DC | PRN
  Administered 2013-03-12: 25 ug/kg/min via INTRAVENOUS

## 2013-03-12 SURGICAL SUPPLY — 56 items
APL SKNCLS STERI-STRIP NONHPOA (GAUZE/BANDAGES/DRESSINGS)
BANDAGE COBAN STERILE 2 (GAUZE/BANDAGES/DRESSINGS) IMPLANT
BANDAGE ELASTIC 3 VELCRO ST LF (GAUZE/BANDAGES/DRESSINGS) IMPLANT
BANDAGE GAUZE STRT 1 STR LF (GAUZE/BANDAGES/DRESSINGS) IMPLANT
BENZOIN TINCTURE PRP APPL 2/3 (GAUZE/BANDAGES/DRESSINGS) IMPLANT
BLADE MINI RND TIP GREEN BEAV (BLADE) IMPLANT
BLADE SURG 15 STRL LF DISP TIS (BLADE) ×2 IMPLANT
BLADE SURG 15 STRL SS (BLADE) ×4
BNDG CMPR 9X4 STRL LF SNTH (GAUZE/BANDAGES/DRESSINGS)
BNDG CMPR MD 5X2 ELC HKLP STRL (GAUZE/BANDAGES/DRESSINGS)
BNDG COHESIVE 1X5 TAN STRL LF (GAUZE/BANDAGES/DRESSINGS) ×1 IMPLANT
BNDG CONFORM 2 STRL LF (GAUZE/BANDAGES/DRESSINGS) IMPLANT
BNDG ELASTIC 2 VLCR STRL LF (GAUZE/BANDAGES/DRESSINGS) IMPLANT
BNDG ESMARK 4X9 LF (GAUZE/BANDAGES/DRESSINGS) IMPLANT
BNDG GAUZE ELAST 4 BULKY (GAUZE/BANDAGES/DRESSINGS) IMPLANT
BNDG PLASTER X FAST 3X3 WHT LF (CAST SUPPLIES) IMPLANT
BNDG PLSTR 9X3 FST ST WHT (CAST SUPPLIES)
CHLORAPREP W/TINT 26ML (MISCELLANEOUS) ×2 IMPLANT
CORDS BIPOLAR (ELECTRODE) ×2 IMPLANT
COVER MAYO STAND STRL (DRAPES) ×2 IMPLANT
COVER TABLE BACK 60X90 (DRAPES) ×2 IMPLANT
CUFF TOURNIQUET SINGLE 18IN (TOURNIQUET CUFF) ×2 IMPLANT
DRAPE EXTREMITY T 121X128X90 (DRAPE) ×2 IMPLANT
DRAPE SURG 17X23 STRL (DRAPES) ×2 IMPLANT
GAUZE XEROFORM 1X8 LF (GAUZE/BANDAGES/DRESSINGS) ×2 IMPLANT
GLOVE BIO SURGEON STRL SZ7.5 (GLOVE) ×2 IMPLANT
GLOVE BIOGEL PI IND STRL 7.0 (GLOVE) IMPLANT
GLOVE BIOGEL PI IND STRL 8 (GLOVE) ×1 IMPLANT
GLOVE BIOGEL PI INDICATOR 7.0 (GLOVE) ×1
GLOVE BIOGEL PI INDICATOR 8 (GLOVE) ×1
GLOVE ECLIPSE 7.0 STRL STRAW (GLOVE) ×1 IMPLANT
GOWN STRL REUS W/ TWL LRG LVL3 (GOWN DISPOSABLE) ×1 IMPLANT
GOWN STRL REUS W/TWL LRG LVL3 (GOWN DISPOSABLE) ×2
GOWN STRL REUS W/TWL XL LVL3 (GOWN DISPOSABLE) ×2 IMPLANT
NDL HYPO 25X1 1.5 SAFETY (NEEDLE) ×1 IMPLANT
NEEDLE HYPO 25X1 1.5 SAFETY (NEEDLE) ×2 IMPLANT
NS IRRIG 1000ML POUR BTL (IV SOLUTION) ×2 IMPLANT
PACK BASIN DAY SURGERY FS (CUSTOM PROCEDURE TRAY) ×2 IMPLANT
PAD CAST 3X4 CTTN HI CHSV (CAST SUPPLIES) IMPLANT
PAD CAST 4YDX4 CTTN HI CHSV (CAST SUPPLIES) IMPLANT
PADDING CAST ABS 4INX4YD NS (CAST SUPPLIES) ×1
PADDING CAST ABS COTTON 4X4 ST (CAST SUPPLIES) ×1 IMPLANT
PADDING CAST COTTON 3X4 STRL (CAST SUPPLIES)
PADDING CAST COTTON 4X4 STRL (CAST SUPPLIES)
SPLINT FINGER 5/8X3.25 (SOFTGOODS) IMPLANT
SPLINT FINGER FOAM 3 9119 05 (SOFTGOODS) ×2
SPONGE GAUZE 4X4 12PLY (GAUZE/BANDAGES/DRESSINGS) ×2 IMPLANT
STOCKINETTE 4X48 STRL (DRAPES) ×2 IMPLANT
SUT ETHILON 3 0 PS 1 (SUTURE) IMPLANT
SUT ETHILON 4 0 PS 2 18 (SUTURE) ×2 IMPLANT
SUT ETHILON 5 0 P 3 18 (SUTURE)
SUT NYLON ETHILON 5-0 P-3 1X18 (SUTURE) IMPLANT
SYR BULB 3OZ (MISCELLANEOUS) ×2 IMPLANT
SYR CONTROL 10ML LL (SYRINGE) ×2 IMPLANT
TOWEL OR 17X24 6PK STRL BLUE (TOWEL DISPOSABLE) ×4 IMPLANT
UNDERPAD 30X30 INCONTINENT (UNDERPADS AND DIAPERS) ×2 IMPLANT

## 2013-03-12 NOTE — Brief Op Note (Signed)
03/12/2013  9:31 AM  PATIENT:  [name] [name]  58 y.o. male  PRE-OPERATIVE DIAGNOSIS:  LEFT INDEX MUCOID CYST AND DISTAL INTERPHALANGEAL ARTHROSIS  POST-OPERATIVE DIAGNOSIS:  LEFT INDEX MUCOID CYST AND DISTAL INTERPHALANGEAL ARTHROSIS  PROCEDURE:  Procedure(s): LEFT INDEX EXCISION MASS AND DEBRIDMENT DISTAL INTERPHALANGEAL JOINT (Left)  SURGEON:  Surgeon(s) and Role:    * Tennis Must, MD - Primary  PHYSICIAN ASSISTANT:   ASSISTANTS: none   ANESTHESIA:   Bier block  EBL:  Total I/O In: 1000 [I.V.:1000] Out: -   BLOOD ADMINISTERED:none  DRAINS: none   LOCAL MEDICATIONS USED:  MARCAINE     SPECIMEN:  Source of Specimen:  left index finger  DISPOSITION OF SPECIMEN:  PATHOLOGY  COUNTS:  YES  TOURNIQUET:   Total Tourniquet Time Documented: Forearm (Left) - 32 minutes Total: Forearm (Left) - 32 minutes   DICTATION: .Other Dictation: Dictation Number [id number]  PLAN OF CARE: Discharge to home after PACU  PATIENT DISPOSITION:  PACU - hemodynamically stable.

## 2013-03-12 NOTE — H&P (Signed)
[name] [name] is an 58 y.o. male.   Chief Complaint: left index finger mucoid cyst HPI: 58 yo rhd male with cyst on dorsum of left index finger.  He has drained this multiple times on his own with continued recurrence.  He wishes to have it removed.  Past Medical History  Diagnosis Date  . Diabetes mellitus without complication   . Hyperlipidemia   . Coronary artery disease   . Psoriasis   . Aortic dissection   . Hypertension     off meds    Past Surgical History  Procedure Laterality Date  . Ankle surgery      right  . Coronary artery bypass graft  09/30/2001    CABG x 4  . Cardiac catheterization  09/25/2001  . Laparoscopic inguinal hernia repair Bilateral 02/09/2010  . Colonoscopy      Family History  Problem Relation Age of Onset  . Cancer Paternal Grandmother     Colon Cancer   Social History:  reports that he has never smoked. He does not have any smokeless tobacco history on file. He reports that he drinks about 1.0 ounces of alcohol per week. He reports that he does not use illicit drugs.  Allergies: No Known Allergies  Medications Prior to Admission  Medication Sig Dispense Refill  . aspirin 325 MG EC tablet Take 325 mg by mouth daily.      Marland Kitchen atorvastatin (LIPITOR) 40 MG tablet Take 40 mg by mouth daily.      . insulin aspart (NOVOLOG) 100 UNIT/ML injection Inject into the skin 3 (three) times daily before meals.      . insulin glargine (LANTUS) 100 UNIT/ML injection Inject 64 Units into the skin at bedtime.       . metFORMIN (GLUCOPHAGE) 1000 MG tablet Take 1,000 mg by mouth 2 (two) times daily with a meal.      . glucose blood test strip 1 each by Other route as needed for other. Use as instructed      . Insulin Pen Needle (BD PEN NEEDLE NANO U/F) 32G X 4 MM MISC by Does not apply route.        Results for orders placed during the hospital encounter of 03/12/13 (from the past 48 hour(s))  BASIC METABOLIC PANEL     Status: Abnormal   Collection Time   03/11/13  1:30 PM      Result Value Ref Range   Sodium 132 (*) 137 - 147 mEq/L   Potassium 4.9  3.7 - 5.3 mEq/L   Chloride 94 (*) 96 - 112 mEq/L   CO2 22  19 - 32 mEq/L   Glucose, Bld 493 (*) 70 - 99 mg/dL   BUN 13  6 - 23 mg/dL   Creatinine, Ser 0.55  0.50 - 1.35 mg/dL   Calcium 9.2  8.4 - 10.5 mg/dL   GFR calc non Af Amer >90  >90 mL/min   GFR calc Af Amer >90  >90 mL/min   Comment: (NOTE)     The eGFR has been calculated using the CKD EPI equation.     This calculation has not been validated in all clinical situations.     eGFR's persistently <90 mL/min signify possible Chronic Kidney     Disease.  GLUCOSE, CAPILLARY     Status: Abnormal   Collection Time    03/12/13  7:13 AM      Result Value Ref Range   Glucose-Capillary 359 (*) 70 - 99 mg/dL  POCT HEMOGLOBIN-HEMACUE     Status: Abnormal   Collection Time    03/12/13  7:48 AM      Result Value Ref Range   Hemoglobin 17.8 (*) 13.0 - 17.0 g/dL    No results found.   A comprehensive review of systems was negative.  Blood pressure 120/83, pulse 79, temperature 98.3 F (36.8 C), temperature source Oral, resp. rate 20, height 6' 2"  (1.88 m), weight 199 lb (90.266 kg), SpO2 97.00%.  General appearance: alert, cooperative and appears stated age Head: Normocephalic, without obvious abnormality, atraumatic Neck: supple, symmetrical, trachea midline Resp: clear to auscultation bilaterally Cardio: regular rate and rhythm GI: non tender Extremities: intact sensation and capillary refill all digits.  +epl/fpl/io.  cyst on dorsum left index finger.   Pulses: 2+ and symmetric Skin: Skin color, texture, turgor normal. No rashes or lesions Neurologic: Grossly normal Incision/Wound: none  Assessment/Plan Left index finger mucoid cyst.  Non operative and operative treatment options were discussed with the patient and patient wishes to proceed with operative treatment. Risks, benefits, and alternatives of surgery were discussed and  the patient agrees with the plan of care.   Brianca Fortenberry R 03/12/2013, 8:28 AM

## 2013-03-12 NOTE — Op Note (Signed)
[name], [name] NO.:  [id number]  MEDICAL RECORD NO.:  [id number]  LOCATION:                                 FACILITY:  PHYSICIAN:  Leanora Cover, MD             DATE OF BIRTH:  DATE OF PROCEDURE:  03/12/2013 DATE OF DISCHARGE:                              OPERATIVE REPORT   PREOPERATIVE DIAGNOSIS:  Left index finger mucoid cyst and distal interphalangeal joint arthrosis.  POSTOPERATIVE DIAGNOSIS:  Left index finger mucoid cyst and distal interphalangeal joint arthrosis.  PROCEDURE:  Left index finger excision of mass and debridement distal interphalangeal joint.  SURGEON:  Leanora Cover, MD  ASSISTANT:  None.  ANESTHESIA:  Bier block with sedation.  IV FLUIDS:  Per anesthesia flow sheet.  ESTIMATED BLOOD LOSS:  Minimal.  COMPLICATIONS:  None.  SPECIMENS:  Left index finger mucoid cyst to Pathology.  TOURNIQUET TIME:  32 minutes.  DISPOSITION:  Stable to PACU.  INDICATIONS:  [name] [name] is a 58 year old, right-hand dominant male, who has had a cyst on the dorsum of his left index finger.  That was bothersome to him.  He had popped it multiple times with his insulin needles.  He states most recently he had popped last week when a box fell on it.  Risks, benefits, and alternatives of the surgery were discussed including the risk of blood loss, infection, damage to nerves, vessels, tendons, ligaments, bone; failure of surgery; need for additional surgery, complications with wound healing, continued pain, and recurrence of the cyst.  He voiced understanding of these risks and elected to proceed.  OPERATIVE COURSE:  After being identified preoperatively by myself, the patient and I agreed upon procedure and site of procedure.  Surgical site was marked.  Risks, benefits, and alternatives of surgery were reviewed and he wished to proceed.  Surgical consent had been signed. He was given IV Ancef as preoperative antibiotic prophylaxis.  He  was transferred to the operating room, placed on the operating table in supine position with left upper extremity on arm board.  Bier block anesthesia was induced by anesthesiologist.  Left upper extremity was prepped and draped in normal sterile orthopedic fashion.  A surgical pause was performed between surgeons, anesthesia, operating staff, and all were in agreement as to the patient, procedure, and site of procedure.  The tourniquet had been inflated for the Bier block.  A hockey stick shaped incision was made at the DIP joint of the left index finger.  It was carried into subcutaneous tissues by spreading technique.  Bipolar electrocautery was used to obtain hemostasis.  The cyst was easily identified.  It had eroded through the skin.  The tissue was friable.  It was excised in its entirety and sent to Pathology for examination.  There appeared to be some inflammation and thickening of the skin in the area as well.  The wound was irrigated.  The extensor tendon was elevated and the DIP joint accessed.  The synovectomy rongeurs were used to debride the DIP joint.  It did not feel any significant osteophyte formation.  The wounds were copiously irrigated with sterile saline.  They were then closed  with 4-0 nylon in a horizontal mattress fashion.  The defect from the cyst was able to be closed primarily as well.  The wound was dressed with sterile Xeroform, 4x4s, and wrapped with a Coban dressing lightly including an AlumaFoam splint.  Digital block was performed with 10 mL of 0.25% plain Marcaine to aid in postoperative analgesia.  The operative drapes were broken down.  The tourniquet was deflated at 32 minutes.  Fingertips were pink with brisk capillary refill after deflation of tourniquet.  The patient was transferred back to stretcher and taken to PACU in stable condition. I will see him back in the office in 1 week for postoperative followup. I will give him Norco 5/325, 1 to 2  p.o. q.6 hours p.r.n. pain, dispensed #30; and Bactrim DS 1 p.o. b.i.d. x7 days as antibiotic coverage due to the recent drainage of the cyst.     Leanora Cover, MD     KK/MEDQ  D:  03/12/2013  T:  03/12/2013  Job:  [id number]

## 2013-03-12 NOTE — Anesthesia Procedure Notes (Signed)
Anesthesia Regional Block:  Bier block (IV Regional)  Pre-Anesthetic Checklist: ,, timeout performed, Correct Patient, Correct Site, Correct Procedure,, site marked,,, surgical consent,, at surgeon's request  Laterality: Left  Prep: alcohol swabs       Needles:  Injection technique: Single-shot      Needle Gauge: 20 and 20 G    Additional Needles: Bier block (IV Regional) Narrative:  Resident/CRNA: Toula Moos, CRNA  Additional Notes: 0.5% Lidocaine 31ml  Infused slowly without complication.  Patient tolerated procedure well.

## 2013-03-12 NOTE — Op Note (Signed)
[id number] 

## 2013-03-12 NOTE — Discharge Instructions (Addendum)
[facility] Instructions °Hand Surgery ° °Wound Care: °Keep your hand elevated above the level of your heart.  Do not allow it to dangle by your side.  Keep the dressing dry and do not remove it unless your doctor advises you to do so.  He will usually change it at the time of your post-op visit.  Moving your fingers is advised to stimulate circulation but will depend on the site of your surgery.  If you have a splint applied, your doctor will advise you regarding movement. ° °Activity: °Do not drive or operate machinery today.  Rest today and then you may return to your normal activity and work as indicated by your physician. ° °Diet:  °Drink liquids today or eat a light diet.  You may resume a regular diet tomorrow.   ° °General expectations: °Pain for two to three days. °Fingers may become slightly swollen. ° °Call your doctor if any of the following occur: °Severe pain not relieved by pain medication. °Elevated temperature. °Dressing soaked with blood. °Inability to move fingers. °White or bluish color to fingers. ° ° °Post Anesthesia Home Care Instructions ° °Activity: °Get plenty of rest for the remainder of the day. A responsible adult should stay with you for 24 hours following the procedure.  °For the next 24 hours, DO NOT: °-Drive a car °-Operate machinery °-Drink alcoholic beverages °-Take any medication unless instructed by your physician °-Make any legal decisions or sign important papers. ° °Meals: °Start with liquid foods such as gelatin or soup. Progress to regular foods as tolerated. Avoid greasy, spicy, heavy foods. If nausea and/or vomiting occur, drink only clear liquids until the nausea and/or vomiting subsides. Call your physician if vomiting continues. ° °Special Instructions/Symptoms: °Your throat may feel dry or sore from the anesthesia or the breathing tube placed in your throat during surgery. If this causes discomfort, gargle with warm salt water. The discomfort should disappear within 24  hours. ° °

## 2013-03-12 NOTE — Transfer of Care (Signed)
Immediate Anesthesia Transfer of Care Note  Patient: [name] [name]  Procedure(s) Performed: Procedure(s): LEFT INDEX EXCISION MASS AND DEBRIDMENT DISTAL INTERPHALANGEAL JOINT (Left)  Patient Location: PACU  Anesthesia Type:Regional  Level of Consciousness: awake, alert , oriented and patient cooperative  Airway & Oxygen Therapy: Patient Spontanous Breathing and Patient connected to face mask oxygen  Post-op Assessment: Report given to PACU RN  Post vital signs: Reviewed and stable  Complications: No apparent anesthesia complications

## 2013-03-12 NOTE — Anesthesia Preprocedure Evaluation (Addendum)
Anesthesia Evaluation  Patient identified by MRN, date of birth, ID band Patient awake    Reviewed: Allergy & Precautions, H&P , NPO status , Patient's Chart, lab work & pertinent test results  History of Anesthesia Complications Negative for: history of anesthetic complications  Airway Mallampati: II TM Distance: >3 FB Neck ROM: Full    Dental  (+) Teeth Intact, Dental Advisory Given   Pulmonary neg pulmonary ROS,    Pulmonary exam normal       Cardiovascular hypertension, + CAD and + Peripheral Vascular Disease     Neuro/Psych negative neurological ROS  negative psych ROS   GI/Hepatic negative GI ROS, Neg liver ROS,   Endo/Other  diabetes  Renal/GU negative Renal ROS     Musculoskeletal   Abdominal   Peds  Hematology   Anesthesia Other Findings   Reproductive/Obstetrics                          Anesthesia Physical Anesthesia Plan  ASA: III  Anesthesia Plan: MAC and Bier Block   Post-op Pain Management:    Induction: Intravenous  Airway Management Planned: Simple Face Mask  Additional Equipment:   Intra-op Plan:   Post-operative Plan:   Informed Consent: I have reviewed the patients History and Physical, chart, labs and discussed the procedure including the risks, benefits and alternatives for the proposed anesthesia with the patient or authorized representative who has indicated his/her understanding and acceptance.   Dental advisory given and Consent reviewed with POA  Plan Discussed with: CRNA, Anesthesiologist and Surgeon  Anesthesia Plan Comments:        Anesthesia Quick Evaluation

## 2013-03-12 NOTE — Anesthesia Postprocedure Evaluation (Signed)
Anesthesia Post Note  Patient: [name] [name]  Procedure(s) Performed: Procedure(s) (LRB): LEFT INDEX EXCISION MASS AND DEBRIDMENT DISTAL INTERPHALANGEAL JOINT (Left)  Anesthesia type: MAC  Patient location: PACU  Post pain: Pain level controlled  Post assessment: Patient's Cardiovascular Status Stable  Last Vitals:  Filed Vitals:   03/12/13 1015  BP: 105/68  Pulse: 68  Temp:   Resp: 16    Post vital signs: Reviewed and stable  Level of consciousness: sedated  Complications: No apparent anesthesia complications

## 2013-03-16 ENCOUNTER — Encounter (HOSPITAL_BASED_OUTPATIENT_CLINIC_OR_DEPARTMENT_OTHER): Payer: Self-pay | Admitting: Orthopedic Surgery

## 2015-03-15 DIAGNOSIS — N529 Male erectile dysfunction, unspecified: Secondary | ICD-10-CM | POA: Insufficient documentation

## 2015-06-09 DIAGNOSIS — N486 Induration penis plastica: Secondary | ICD-10-CM | POA: Insufficient documentation

## 2016-03-07 DIAGNOSIS — R42 Dizziness and giddiness: Secondary | ICD-10-CM | POA: Insufficient documentation

## 2017-07-12 DIAGNOSIS — M79645 Pain in left finger(s): Secondary | ICD-10-CM | POA: Insufficient documentation

## 2017-07-12 DIAGNOSIS — G5622 Lesion of ulnar nerve, left upper limb: Secondary | ICD-10-CM | POA: Insufficient documentation

## 2017-07-12 HISTORY — DX: Lesion of ulnar nerve, left upper limb: G56.22

## 2018-03-10 ENCOUNTER — Encounter: Payer: Self-pay | Admitting: Internal Medicine

## 2018-04-24 DIAGNOSIS — M7651 Patellar tendinitis, right knee: Secondary | ICD-10-CM | POA: Insufficient documentation

## 2018-05-15 DIAGNOSIS — G8929 Other chronic pain: Secondary | ICD-10-CM | POA: Insufficient documentation

## 2018-06-09 ENCOUNTER — Other Ambulatory Visit: Payer: Self-pay

## 2018-06-09 ENCOUNTER — Encounter (HOSPITAL_COMMUNITY): Payer: Self-pay | Admitting: Emergency Medicine

## 2018-06-09 ENCOUNTER — Emergency Department (HOSPITAL_COMMUNITY)
Admission: EM | Admit: 2018-06-09 | Discharge: 2018-06-09 | Disposition: A | Discharge status: Home / Self Care | Payer: Medicare HMO | Attending: Emergency Medicine | Admitting: Emergency Medicine

## 2018-06-09 DIAGNOSIS — Z5321 Procedure and treatment not carried out due to patient leaving prior to being seen by health care provider: Secondary | ICD-10-CM | POA: Diagnosis not present

## 2018-06-09 DIAGNOSIS — M25561 Pain in right knee: Secondary | ICD-10-CM | POA: Diagnosis present

## 2018-06-09 DIAGNOSIS — R112 Nausea with vomiting, unspecified: Secondary | ICD-10-CM | POA: Diagnosis not present

## 2018-06-09 LAB — COMPREHENSIVE METABOLIC PANEL
ALT: 14 U/L (ref 0–44)
AST: 13 U/L — ABNORMAL LOW (ref 15–41)
Albumin: 3.6 g/dL (ref 3.5–5.0)
Alkaline Phosphatase: 54 U/L (ref 38–126)
Anion gap: 14 (ref 5–15)
BUN: 14 mg/dL (ref 8–23)
CO2: 19 mmol/L — ABNORMAL LOW (ref 22–32)
Calcium: 8.6 mg/dL — ABNORMAL LOW (ref 8.9–10.3)
Chloride: 101 mmol/L (ref 98–111)
Creatinine, Ser: 1.25 mg/dL — ABNORMAL HIGH (ref 0.61–1.24)
GFR calc Af Amer: >60 mL/min (ref >60)
GFR calc non Af Amer: >60 mL/min (ref >60)
Glucose, Bld: 460 mg/dL — ABNORMAL HIGH (ref 70–99)
Potassium: 4.3 mmol/L (ref 3.5–5.1)
Sodium: 134 mmol/L — ABNORMAL LOW (ref 135–145)
Total Bilirubin: 1.6 mg/dL — ABNORMAL HIGH (ref 0.3–1.2)
Total Protein: 6 g/dL — ABNORMAL LOW (ref 6.5–8.1)

## 2018-06-09 LAB — CBC WITH DIFFERENTIAL/PLATELET
Abs Immature Granulocytes: 0.02 10*3/uL (ref 0.00–0.07)
Basophils Absolute: 0 10*3/uL (ref 0.0–0.1)
Basophils Relative: 1 %
Eosinophils Absolute: 0 10*3/uL (ref 0.0–0.5)
Eosinophils Relative: 1 %
HCT: 40.5 % (ref 39.0–52.0)
Hemoglobin: 13.8 g/dL (ref 13.0–17.0)
Immature Granulocytes: 0 %
Lymphocytes Relative: 26 %
Lymphs Abs: 1.3 10*3/uL (ref 0.7–4.0)
MCH: 32.4 pg (ref 26.0–34.0)
MCHC: 34.1 g/dL (ref 30.0–36.0)
MCV: 95.1 fL (ref 80.0–100.0)
Monocytes Absolute: 0.5 10*3/uL (ref 0.1–1.0)
Monocytes Relative: 10 %
Neutro Abs: 2.9 10*3/uL (ref 1.7–7.7)
Neutrophils Relative %: 62 %
Platelets: 173 10*3/uL (ref 150–400)
RBC: 4.26 MIL/uL (ref 4.22–5.81)
RDW: 12.5 % (ref 11.5–15.5)
WBC: 4.7 10*3/uL (ref 4.0–10.5)
nRBC: 0 % (ref 0.0–0.2)

## 2018-06-09 LAB — LIPASE, BLOOD: Lipase: 20 U/L (ref 11–51)

## 2018-06-09 NOTE — ED Notes (Addendum)
Pt seems to have left without being taken to the back, called name multiple times with no reply

## 2018-06-09 NOTE — ED Triage Notes (Signed)
Pt in with R knee pain x few days, states pain has gotten worse recently. Denies any injury to area, has some swelling but no redness

## 2018-06-09 NOTE — ED Provider Notes (Signed)
63 year old male seen in triage with complaints of knee pain nausea and vomiting.  Patient notes he has had weight loss secondary to inability to tolerate p.o.  He denies any abdominal pain.  Sent by his primary care for further work-up.   Patient will need further work-up beyond [facility] care.   Okey Regal, PA-C 06/09/18 1631    Julianne Rice, MD 06/24/18 [phone]

## 2018-06-10 DIAGNOSIS — F32A Depression, unspecified: Secondary | ICD-10-CM | POA: Insufficient documentation

## 2018-06-10 DIAGNOSIS — R1111 Vomiting without nausea: Secondary | ICD-10-CM

## 2018-06-10 DIAGNOSIS — R1110 Vomiting, unspecified: Secondary | ICD-10-CM | POA: Insufficient documentation

## 2018-06-10 DIAGNOSIS — E1110 Type 2 diabetes mellitus with ketoacidosis without coma: Secondary | ICD-10-CM | POA: Insufficient documentation

## 2018-06-10 HISTORY — DX: Vomiting without nausea: R11.11

## 2018-08-11 ENCOUNTER — Other Ambulatory Visit: Payer: Self-pay

## 2018-08-13 ENCOUNTER — Ambulatory Visit: Payer: Medicare HMO | Admitting: Internal Medicine

## 2018-08-13 NOTE — Progress Notes (Deleted)
Name: [name] [name]  MRN/ DOB: [id number], 1955/12/24   Age/ Sex: 63 y.o., male    PCP: Chesley Noon, MD   Reason for Endocrinology Evaluation: Type 2 Diabetes Mellitus     Date of Initial Endocrinology Visit: 08/13/2018     PATIENT IDENTIFIER: [name] [name] is a 63 y.o. male with a past medical history of Psoriasis, S/P CABG and Aortic aneurysm repair, and T2DM. The patient presented for initial endocrinology clinic visit on 08/13/2018 for consultative assistance with his diabetes management.    HPI: Mr. Begay was    Diagnosed with T2DM *** Prior Medications tried/Intolerance: *** Currently checking blood sugars *** x / day,  before breakfast and ***.  Hypoglycemia episodes : ***               Symptoms: ***                 Frequency: ***/  Hemoglobin A1c has ranged from *** in ***, peaking at *** in ***. Patient required assistance for hypoglycemia:  Patient has required hospitalization within the last 1 year from hyper or hypoglycemia:   In terms of diet, the patient ***   HOME DIABETES REGIMEN: Lantus  Novolog  Metformin 1000 mg BID    Statin: yes ACE-I/ARB: no Prior Diabetic Education: {Yes/No:11203}   METER DOWNLOAD SUMMARY: Date range evaluated: *** Fingerstick Blood Glucose Tests = *** Average Number Tests/Day = *** Overall Mean FS Glucose = *** Standard Deviation = ***  BG Ranges: Low = *** High = ***   Hypoglycemic Events/30 Days: BG < 50 = *** Episodes of symptomatic severe hypoglycemia = ***   DIABETIC COMPLICATIONS: Microvascular complications:   Non-proliferative diabetic retinopathy  Denies: ***  Last eye exam: Completed   Macrovascular complications:   CAD  Denies: PVD, CVA   PAST HISTORY: Past Medical History:  Past Medical History:  Diagnosis Date  . Aortic dissection ([place])   . Coronary artery disease   . Diabetes mellitus without complication ([place])   . Hyperlipidemia   . Hypertension    off meds  .  Psoriasis     Past Surgical History:  Past Surgical History:  Procedure Laterality Date  . ANKLE SURGERY     right  . CARDIAC CATHETERIZATION  09/25/2001  . COLONOSCOPY    . CORONARY ARTERY BYPASS GRAFT  09/30/2001   CABG x 4  . LAPAROSCOPIC INGUINAL HERNIA REPAIR Bilateral 02/09/2010  . MASS EXCISION Left 03/12/2013   Procedure: LEFT INDEX EXCISION MASS AND DEBRIDMENT DISTAL INTERPHALANGEAL JOINT;  Surgeon: Tennis Must, MD;  Location: [facility];  Service: Orthopedics;  Laterality: Left;      Social History:  reports that he has never smoked. He has never used smokeless tobacco. He reports current alcohol use of about 2.0 - 3.0 standard drinks of alcohol per week. He reports that he does not use drugs. Family History:  Family History  Problem Relation Age of Onset  . Cancer Paternal Grandmother        Colon Cancer      HOME MEDICATIONS: Allergies as of 08/13/2018   No Known Allergies     Medication List       Accurate as of August 13, 2018  7:13 AM. If you have any questions, ask your nurse or doctor.        aspirin 325 MG EC tablet Take 325 mg by mouth daily.   atorvastatin 40 MG tablet Commonly known as: LIPITOR  Take 40 mg by mouth daily.   BD Pen Needle Nano U/F 32G X 4 MM Misc Generic drug: Insulin Pen Needle by Does not apply route.   glucose blood test strip 1 each by Other route as needed for other. Use as instructed   HYDROcodone-acetaminophen 5-325 MG tablet Commonly known as: Norco 1-2 tabs po q6 hours prn pain   Lantus 100 UNIT/ML injection Generic drug: insulin glargine Inject 64 Units into the skin at bedtime.   metFORMIN 1000 MG tablet Commonly known as: GLUCOPHAGE Take 1,000 mg by mouth 2 (two) times daily with a meal.   NovoLOG 100 UNIT/ML injection Generic drug: insulin aspart Inject into the skin 3 (three) times daily before meals.   sulfamethoxazole-trimethoprim 800-160 MG tablet Commonly known as: Bactrim DS Take  1 tablet by mouth 2 (two) times daily.        ALLERGIES: No Known Allergies   REVIEW OF SYSTEMS: A comprehensive ROS was conducted with the patient and is negative except as per HPI and below:  ROS    OBJECTIVE:   VITAL SIGNS: There were no vitals taken for this visit.   PHYSICAL EXAM:  General: Pt appears well and is in NAD  Hydration: Well-hydrated with moist mucous membranes and good skin turgor  HEENT: Head: Unremarkable with good dentition. Oropharynx clear without exudate.  Eyes: External eye exam normal without stare, lid lag or exophthalmos.  EOM intact.  PERRL.  Neck: General: Supple without adenopathy or carotid bruits. Thyroid: Thyroid size normal.  No goiter or nodules appreciated. No thyroid bruit.  Lungs: Clear with good BS bilat with no rales, rhonchi, or wheezes  Heart: RRR with normal S1 and S2 and no gallops; no murmurs; no rub  Abdomen: Normoactive bowel sounds, soft, nontender, without masses or organomegaly palpable  Extremities:  Lower extremities - No pretibial edema. No lesions.  Skin: Normal texture and temperature to palpation. No rash noted. No Acanthosis nigricans/skin tags. No lipohypertrophy.  Neuro: MS is good with appropriate affect, pt is alert and Ox3    DM foot exam:    DATA REVIEWED:  No results found for: HGBA1C Lab Results  Component Value Date   CREATININE 1.25 (H) 06/09/2018     ASSESSMENT / PLAN / RECOMMENDATIONS:   1) Type 2 Diabetes Mellitus, ***controlled, With Retinopathic and Macrovascular complications - Most recent A1c of *** %. Goal A1c < 7.0 %.    Plan: GENERAL:  ***  MEDICATIONS:  ***  EDUCATION / INSTRUCTIONS:  BG monitoring instructions: Patient is instructed to check his blood sugars *** times a day, ***.  Call [facility] if: BG persistently < 70 or > 300. . I reviewed the Rule of 15 for the treatment of hypoglycemia in detail with the patient. Literature supplied.   2) Diabetic  complications:   Eye: Does *** have known diabetic retinopathy.   Neuro/ Feet: Does *** have known diabetic peripheral neuropathy.  Renal: Patient does *** have known baseline CKD. He is *** on an ACEI/ARB at present.Check urine albumin/creatinine ratio yearly starting at time of diagnosis. If albuminuria is positive, treatment is geared toward better glucose, blood pressure control and use of ACE inhibitors or ARBs. Monitor electrolytes and creatinine once to twice yearly.   3) Lipids: Patient is *** on a statin.    4) Hypertension: ***  at goal of < 140/90 mmHg.       Signed electronically by: Mack Guise, MD  [facility]  [facility] [address], [place], [place] [address] Phone: [phone] FAX: [phone]   CC: Chesley Noon, MD [place] [address] Phone: [phone]  Fax: [phone]    Return to Endocrinology clinic as below: Future Appointments  Date Time Provider [place]  08/13/2018  9:30 AM Vedika Dumlao, Melanie Crazier, MD [facility] None

## 2018-08-19 ENCOUNTER — Other Ambulatory Visit: Payer: Self-pay

## 2018-08-20 ENCOUNTER — Ambulatory Visit: Payer: Medicare HMO | Admitting: Internal Medicine

## 2018-08-20 NOTE — Progress Notes (Deleted)
Name: [name] [name]  MRN/ DOB: [id number], November 26, 1955   Age/ Sex: 63 y.o., male    PCP: Chesley Noon, MD   Reason for Endocrinology Evaluation: Type 2 Diabetes Mellitus     Date of Initial Endocrinology Visit: 08/20/2018     PATIENT IDENTIFIER: [name] is a 63 y.o. male with a past medical history of Psoriasis, S/P CABG and Aortic aneurysm repair, and T2DM. The patient presented for initial endocrinology clinic visit on 08/20/2018 for consultative assistance with his diabetes management.    HPI: Mr. Eisenhardt was    Diagnosed with T2DM in 2009 Prior Medications tried/Intolerance: *** Currently checking blood sugars *** x / day,  before breakfast and ***.  Hypoglycemia episodes : ***               Symptoms: ***                 Frequency: ***/  Hemoglobin A1c has ranged from *** in ***, peaking at *** in ***. Patient required assistance for hypoglycemia:  Patient has required hospitalization within the last 1 year from hyper or hypoglycemia:   In terms of diet, the patient ***   HOME DIABETES REGIMEN: Lantus  Novolog  Metformin 1000 mg BID    Statin: yes ACE-I/ARB: no Prior Diabetic Education: {Yes/No:11203}   METER DOWNLOAD SUMMARY: Date range evaluated: *** Fingerstick Blood Glucose Tests = *** Average Number Tests/Day = *** Overall Mean FS Glucose = *** Standard Deviation = ***  BG Ranges: Low = *** High = ***   Hypoglycemic Events/30 Days: BG < 50 = *** Episodes of symptomatic severe hypoglycemia = ***   DIABETIC COMPLICATIONS: Microvascular complications:   Non-proliferative diabetic retinopathy  Denies: ***  Last eye exam: Completed   Macrovascular complications:   CAD  Denies: PVD, CVA   PAST HISTORY: Past Medical History:  Past Medical History:  Diagnosis Date  . Aortic dissection ([place])   . Coronary artery disease   . Diabetes mellitus without complication ([place])   . Hyperlipidemia   . Hypertension    off meds   . Psoriasis    Past Surgical History:  Past Surgical History:  Procedure Laterality Date  . ANKLE SURGERY     right  . CARDIAC CATHETERIZATION  09/25/2001  . COLONOSCOPY    . CORONARY ARTERY BYPASS GRAFT  09/30/2001   CABG x 4  . LAPAROSCOPIC INGUINAL HERNIA REPAIR Bilateral 02/09/2010  . MASS EXCISION Left 03/12/2013   Procedure: LEFT INDEX EXCISION MASS AND DEBRIDMENT DISTAL INTERPHALANGEAL JOINT;  Surgeon: Tennis Must, MD;  Location: [facility];  Service: Orthopedics;  Laterality: Left;      Social History:  reports that he has never smoked. He has never used smokeless tobacco. He reports current alcohol use of about 2.0 - 3.0 standard drinks of alcohol per week. He reports that he does not use drugs. Family History:  Family History  Problem Relation Age of Onset  . Cancer Paternal Grandmother        Colon Cancer     HOME MEDICATIONS: Allergies as of 08/20/2018   No Known Allergies     Medication List       Accurate as of August 20, 2018 11:47 AM. If you have any questions, ask your nurse or doctor.        aspirin 325 MG EC tablet Take 325 mg by mouth daily.   atorvastatin 40 MG tablet Commonly known as: LIPITOR Take 40  mg by mouth daily.   BD Pen Needle Nano U/F 32G X 4 MM Misc Generic drug: Insulin Pen Needle by Does not apply route.   FLUoxetine 20 MG tablet Commonly known as: PROZAC Take by mouth.   gabapentin 100 MG capsule Commonly known as: NEURONTIN Take by mouth.   glucose blood test strip 1 each by Other route as needed for other. Use as instructed   HYDROcodone-acetaminophen 5-325 MG tablet Commonly known as: Norco 1-2 tabs po q6 hours prn pain   Lantus 100 UNIT/ML injection Generic drug: insulin glargine Inject 64 Units into the skin at bedtime.   meclizine 25 MG tablet Commonly known as: ANTIVERT Take by mouth.   metFORMIN 1000 MG tablet Commonly known as: GLUCOPHAGE Take 1,000 mg by mouth 2 (two) times daily with a  meal.   NovoLOG 100 UNIT/ML injection Generic drug: insulin aspart Inject into the skin 3 (three) times daily before meals.   sulfamethoxazole-trimethoprim 800-160 MG tablet Commonly known as: Bactrim DS Take 1 tablet by mouth 2 (two) times daily.        ALLERGIES: No Known Allergies   REVIEW OF SYSTEMS: A comprehensive ROS was conducted with the patient and is negative except as per HPI and below:  ROS    OBJECTIVE:   VITAL SIGNS: There were no vitals taken for this visit.   PHYSICAL EXAM:  General: Pt appears well and is in NAD  Hydration: Well-hydrated with moist mucous membranes and good skin turgor  HEENT: Head: Unremarkable with good dentition. Oropharynx clear without exudate.  Eyes: External eye exam normal without stare, lid lag or exophthalmos.  EOM intact.  PERRL.  Neck: General: Supple without adenopathy or carotid bruits. Thyroid: Thyroid size normal.  No goiter or nodules appreciated. No thyroid bruit.  Lungs: Clear with good BS bilat with no rales, rhonchi, or wheezes  Heart: RRR with normal S1 and S2 and no gallops; no murmurs; no rub  Abdomen: Normoactive bowel sounds, soft, nontender, without masses or organomegaly palpable  Extremities:  Lower extremities - No pretibial edema. No lesions.  Skin: Normal texture and temperature to palpation. No rash noted. No Acanthosis nigricans/skin tags. No lipohypertrophy.  Neuro: MS is good with appropriate affect, pt is alert and Ox3    DM foot exam:    DATA REVIEWED:  No results found for: HGBA1C Lab Results  Component Value Date   CREATININE 1.25 (H) 06/09/2018     ASSESSMENT / PLAN / RECOMMENDATIONS:   1) Type 2 Diabetes Mellitus, ***controlled, With Retinopathic and Macrovascular complications - Most recent A1c of *** %. Goal A1c < 7.0 %.    Plan: GENERAL:  ***  MEDICATIONS:  ***  EDUCATION / INSTRUCTIONS:  BG monitoring instructions: Patient is instructed to check his blood sugars ***  times a day, ***.  Call [facility] if: BG persistently < 70 or > 300. . I reviewed the Rule of 15 for the treatment of hypoglycemia in detail with the patient. Literature supplied.   2) Diabetic complications:   Eye: Does *** have known diabetic retinopathy.   Neuro/ Feet: Does *** have known diabetic peripheral neuropathy.  Renal: Patient does *** have known baseline CKD. He is *** on an ACEI/ARB at present.Check urine albumin/creatinine ratio yearly starting at time of diagnosis. If albuminuria is positive, treatment is geared toward better glucose, blood pressure control and use of ACE inhibitors or ARBs. Monitor electrolytes and creatinine once to twice yearly.   3) Lipids: Patient is ***  on a statin.    4) Hypertension: ***  at goal of < 140/90 mmHg.       Signed electronically by: Mack Guise, MD  [facility]  [facility] [address] [place], [place] [address] Phone: [phone] FAX: [phone]   CC: Chesley Noon, MD [place] [address] Phone: [phone]  Fax: [phone]    Return to Endocrinology clinic as below: Future Appointments  Date Time Provider [place]  08/20/2018  1:00 PM Vearl Allbaugh, Melanie Crazier, MD [id number] None

## 2019-01-05 ENCOUNTER — Other Ambulatory Visit: Payer: Self-pay

## 2019-01-06 ENCOUNTER — Ambulatory Visit (INDEPENDENT_AMBULATORY_CARE_PROVIDER_SITE_OTHER): Payer: HMO | Admitting: Internal Medicine

## 2019-01-06 ENCOUNTER — Encounter: Payer: Self-pay | Admitting: Internal Medicine

## 2019-01-06 VITALS — BP 130/78 | HR 79 | Temp 97.6°F | Ht 74.0 in | Wt 162.4 lb

## 2019-01-06 DIAGNOSIS — E1169 Type 2 diabetes mellitus with other specified complication: Secondary | ICD-10-CM | POA: Diagnosis not present

## 2019-01-06 DIAGNOSIS — E785 Hyperlipidemia, unspecified: Secondary | ICD-10-CM | POA: Diagnosis not present

## 2019-01-06 DIAGNOSIS — Z1211 Encounter for screening for malignant neoplasm of colon: Secondary | ICD-10-CM

## 2019-01-06 DIAGNOSIS — F339 Major depressive disorder, recurrent, unspecified: Secondary | ICD-10-CM | POA: Diagnosis not present

## 2019-01-06 DIAGNOSIS — E119 Type 2 diabetes mellitus without complications: Secondary | ICD-10-CM

## 2019-01-06 DIAGNOSIS — E1165 Type 2 diabetes mellitus with hyperglycemia: Secondary | ICD-10-CM | POA: Diagnosis not present

## 2019-01-06 LAB — HEMOGLOBIN A1C: Hgb A1c MFr Bld: 15.1 % — ABNORMAL HIGH (ref 4.6–6.5)

## 2019-01-06 LAB — CBC WITH DIFFERENTIAL/PLATELET
Basophils Absolute: 0 10*3/uL (ref 0.0–0.1)
Basophils Relative: 0.6 % (ref 0.0–3.0)
Eosinophils Absolute: 0.1 10*3/uL (ref 0.0–0.7)
Eosinophils Relative: 1.1 % (ref 0.0–5.0)
HCT: 42.6 % (ref 39.0–52.0)
Hemoglobin: 14.1 g/dL (ref 13.0–17.0)
Lymphocytes Relative: 25 % (ref 12.0–46.0)
Lymphs Abs: 1.4 10*3/uL (ref 0.7–4.0)
MCHC: 33.2 g/dL (ref 30.0–36.0)
MCV: 97.6 fl (ref 78.0–100.0)
Monocytes Absolute: 0.5 10*3/uL (ref 0.1–1.0)
Monocytes Relative: 8.5 % (ref 3.0–12.0)
Neutro Abs: 3.6 10*3/uL (ref 1.4–7.7)
Neutrophils Relative %: 64.8 % (ref 43.0–77.0)
Platelets: 166 10*3/uL (ref 150.0–400.0)
RBC: 4.37 Mil/uL (ref 4.22–5.81)
RDW: 13.7 % (ref 11.5–15.5)
WBC: 5.5 10*3/uL (ref 4.0–10.5)

## 2019-01-06 LAB — COMPREHENSIVE METABOLIC PANEL
ALT: 14 U/L (ref 0–53)
AST: 12 U/L (ref 0–37)
Albumin: 4.3 g/dL (ref 3.5–5.2)
Alkaline Phosphatase: 52 U/L (ref 39–117)
BUN: 10 mg/dL (ref 6–23)
CO2: 23 mEq/L (ref 19–32)
Calcium: 9.2 mg/dL (ref 8.4–10.5)
Chloride: 97 mEq/L (ref 96–112)
Creatinine, Ser: 0.64 mg/dL (ref 0.40–1.50)
GFR: 125.15 mL/min (ref >60.00)
Glucose, Bld: 317 mg/dL — ABNORMAL HIGH (ref 70–99)
Potassium: 4.4 mEq/L (ref 3.5–5.1)
Sodium: 134 mEq/L — ABNORMAL LOW (ref 135–145)
Total Bilirubin: 0.9 mg/dL (ref 0.2–1.2)
Total Protein: 6.3 g/dL (ref 6.0–8.3)

## 2019-01-06 LAB — LIPID PANEL
Cholesterol: 129 mg/dL (ref 0–200)
HDL: 43.1 mg/dL (ref >39.00)
LDL Cholesterol: 53 mg/dL (ref 0–99)
NonHDL: 85.72
Total CHOL/HDL Ratio: 3
Triglycerides: 166 mg/dL — ABNORMAL HIGH (ref 0.0–149.0)
VLDL: 33.2 mg/dL (ref 0.0–40.0)

## 2019-01-06 LAB — MICROALBUMIN / CREATININE URINE RATIO
Creatinine,U: 44.5 mg/dL
Microalb Creat Ratio: 77.2 mg/g — ABNORMAL HIGH (ref 0.0–30.0)
Microalb, Ur: 34.4 mg/dL — ABNORMAL HIGH (ref 0.0–1.9)

## 2019-01-06 LAB — VITAMIN D 25 HYDROXY (VIT D DEFICIENCY, FRACTURES): VITD: 21.71 ng/mL — ABNORMAL LOW (ref 30.00–100.00)

## 2019-01-06 LAB — TSH: TSH: 1.7 u[IU]/mL (ref 0.35–4.50)

## 2019-01-06 LAB — VITAMIN B12: Vitamin B-12: 507 pg/mL (ref 211–911)

## 2019-01-06 NOTE — Progress Notes (Signed)
New Patient Office Visit     This visit occurred during the SARS-CoV-2 public health emergency.  Safety protocols were in place, including screening questions prior to the visit, additional usage of staff PPE, and extensive cleaning of exam room while observing appropriate contact time as indicated for disinfecting solutions.    CC/Reason for Visit: Establish care, discuss chronic medical conditions Previous PCP: Dr. Anastasia Pall Last Visit: Approximately 6 months ago  HPI: [name] [name] is a 64 y.o. male who is coming in today for the above mentioned reasons. Past Medical History is significant for: Uncontrolled type 2 diabetes who stopped taking all medications in August due to financial reasons.  He was on Lantus, Metformin and NovoLog.  He has a history of coronary artery disease he had a CABG from his description what sounds like a thoracic aortic aneurysm repair same time.  He has not had follow-up with thoracic surgery since.  He has not seen his cardiologist either.  His surgeon was Dr. Servando Snare and his cardiologist was Dr. Martinique.  He is here to establish care as he has recently changed insurances.  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  His sons live with him.  He smokes cigars once to twice a week, drinks alcohol occasionally about every week will have a glass of whiskey, has no allergies, his family history is significant for maternal and paternal grandmother with cancer of unknown origin.  He was hospitalized in June with DKA.  He has been having decreased appetite, decreased sleep, has been urinating quite frequently and has lost a lot of weight.  The only medication that he is currently taking is THC Gummies.  He is showing interest in an insulin pump.   Past Medical/Surgical History: Past Medical History:  Diagnosis Date  . Aortic dissection ([place])   . Coronary artery disease   . Diabetes mellitus without complication ([place])   .  Hyperlipidemia   . Hypertension    off meds  . Psoriasis     Past Surgical History:  Procedure Laterality Date  . ANKLE SURGERY     right  . CARDIAC CATHETERIZATION  09/25/2001  . COLONOSCOPY    . CORONARY ARTERY BYPASS GRAFT  09/30/2001   CABG x 4  . LAPAROSCOPIC INGUINAL HERNIA REPAIR Bilateral 02/09/2010  . MASS EXCISION Left 03/12/2013   Procedure: LEFT INDEX EXCISION MASS AND DEBRIDMENT DISTAL INTERPHALANGEAL JOINT;  Surgeon: Tennis Must, MD;  Location: [facility];  Service: Orthopedics;  Laterality: Left;    Social History:  reports that he has never smoked. He has never used smokeless tobacco. He reports current alcohol use of about 2.0 - 3.0 standard drinks of alcohol per week. He reports that he does not use drugs.  Allergies: No Known Allergies  Family History:  Family History  Problem Relation Age of Onset  . Cancer Paternal Grandmother        Colon Cancer     Current Outpatient Medications:  .  Ascorbic Acid (VITAMIN C) 100 MG tablet, Take 100 mg by mouth daily., Disp: , Rfl:  .  aspirin 325 MG EC tablet, Take 325 mg by mouth daily., Disp: , Rfl:  .  atorvastatin (LIPITOR) 40 MG tablet, Take 40 mg by mouth daily., Disp: , Rfl:  .  cholecalciferol (VITAMIN D3) 25 MCG (1000 UT) tablet, Take 1,000 Units by mouth daily., Disp: , Rfl:  .  FLUoxetine (PROZAC) 20 MG  tablet, Take by mouth., Disp: , Rfl:  .  glucose blood test strip, 1 each by Other route as needed for other. Use as instructed, Disp: , Rfl:  .  HYDROcodone-acetaminophen (NORCO) 5-325 MG per tablet, 1-2 tabs po q6 hours prn pain, Disp: 30 tablet, Rfl: 0 .  Insulin Pen Needle (BD PEN NEEDLE NANO U/F) 32G X 4 MM MISC, by Does not apply route., Disp: , Rfl:  .  glipiZIDE (GLUCOTROL) 10 MG tablet, Take 10 mg by mouth daily., Disp: , Rfl:  .  insulin aspart (NOVOLOG) 100 UNIT/ML injection, Inject into the skin 3 (three) times daily before meals., Disp: , Rfl:  .  insulin glargine (LANTUS) 100  UNIT/ML injection, Inject 64 Units into the skin at bedtime. , Disp: , Rfl:  .  metFORMIN (GLUCOPHAGE) 1000 MG tablet, Take 1,000 mg by mouth 2 (two) times daily with a meal., Disp: , Rfl:   Review of Systems:  Constitutional: Positive for diaphoresis, appetite change and fatigue.  HEENT: Denies photophobia, eye pain, redness, hearing loss, ear pain, congestion, sore throat, rhinorrhea, sneezing, mouth sores, trouble swallowing, neck pain, neck stiffness and tinnitus.   Respiratory: Denies SOB, DOE, cough, chest tightness,  and wheezing.   Cardiovascular: Denies chest pain, palpitations and leg swelling.  Gastrointestinal: Denies nausea, vomiting, abdominal pain, diarrhea, constipation, blood in stool and abdominal distention.  Genitourinary: Denies dysuria, urgency, frequency, hematuria, flank pain and difficulty urinating.  Endocrine: Denies: hot or cold intolerance, sweats, changes in hair or nails, polyuria, polydipsia. Musculoskeletal: Denies back pain, joint swelling, arthralgias and gait problem.  Skin: Denies pallor, rash and wound.  Neurological: Denies dizziness, seizures, syncope, weakness, light-headedness, numbness and headaches.  Hematological: Denies adenopathy. Easy bruising, personal or family bleeding history  Psychiatric/Behavioral: Denies suicidal ideation, mood changes, confusion, nervousness, sleep disturbance and agitation    Physical Exam: Vitals:   01/06/19 1338  BP: 130/78  Pulse: 79  Temp: 97.6 F (36.4 C)  TempSrc: Temporal  SpO2: 97%  Weight: 162 lb 6.4 oz (73.7 kg)  Height: 6\' 2"  (1.88 m)   Body mass index is 20.85 kg/m.  Constitutional: NAD, calm, comfortable, pale Eyes: PERRL, lids and conjunctivae normal, wears corrective lenses ENMT: Mucous membranes are moist.  Respiratory: clear to auscultation bilaterally, no wheezing, no crackles. Normal respiratory effort. No accessory muscle use.  Cardiovascular: Regular rate and rhythm, no murmurs / rubs  / gallops. No extremity edema. Skin: no rashes, lesions, ulcers. No induration, poor skin turgor Neurologic: Grossly intact and nonfocal Psychiatric: Normal judgment and insight. Alert and oriented x 3. Normal mood.    Impression and Plan:  Uncontrolled type 2 diabetes mellitus with hyperglycemia ([place])  -Fingerstick A1c did not read on machine today, leading me to believe it is very high. -Will order stat CBC, CMP, TSH, vitamin B12, A1c. -Have provided samples of Basaglar and Humalog, he will do 10 units of Basaglar for now and Humalog on a prescribed sliding scale. -He will follow up with me in 6 weeks with thrice daily CBGs to further adjust insulin. -We will also place endocrinology referral as he is showing interest in insulin pump, once he has an appointment with them I will defer further diabetic management to endocrine.  Encounter for screening colonoscopy  - Plan: Ambulatory referral to Gastroenterology  Depression, recurrent ([place])  - Plan: TSH, Vitamin B12, VITAMIN D 25 Hydroxy (Vit-D Deficiency, Fractures) -   Office Visit from 01/06/2019 in [place] at [facility] Total Score  16     -  Very high PHQ-9 score today.  Check TSH, vitamin D, vitamin B12 to rule out further organic disease. -Suspect very uncontrolled diabetes may also be playing a role. -Follow-up in 6 weeks if PHQ-9 remains elevated, consider treating for depression with medications and CBT sessions.  Hyperlipidemia associated with type 2 diabetes mellitus ([place])  - Plan: Lipid panel -Goal LDL is less than 70.    Patient Instructions  -Nice seeing you today!!  -Lab work today; will notify you once results are available.  -Check CBG fasting in am, before lunch and before dinner and bring numbers into your next visit.  -Start Basaglar 10 units at bedtime and humalog with your sliding scale (please bring your sliding scale into next visit).  -Referral to endocrinology has been  placed.  -Schedule follow up in 6 weeks.   Diabetes Mellitus and Nutrition, Adult When you have diabetes (diabetes mellitus), it is very important to have healthy eating habits because your blood sugar (glucose) levels are greatly affected by what you eat and drink. Eating healthy foods in the appropriate amounts, at about the same times every day, can help you:  Control your blood glucose.  Lower your risk of heart disease.  Improve your blood pressure.  Reach or maintain a healthy weight. Every person with diabetes is different, and each person has different needs for a meal plan. Your health care provider may recommend that you work with a diet and nutrition specialist (dietitian) to make a meal plan that is best for you. Your meal plan may vary depending on factors such as:  The calories you need.  The medicines you take.  Your weight.  Your blood glucose, blood pressure, and cholesterol levels.  Your activity level.  Other health conditions you have, such as heart or kidney disease. How do carbohydrates affect me? Carbohydrates, also called carbs, affect your blood glucose level more than any other type of food. Eating carbs naturally raises the amount of glucose in your blood. Carb counting is a method for keeping track of how many carbs you eat. Counting carbs is important to keep your blood glucose at a healthy level, especially if you use insulin or take certain oral diabetes medicines. It is important to know how many carbs you can safely have in each meal. This is different for every person. Your dietitian can help you calculate how many carbs you should have at each meal and for each snack. Foods that contain carbs include:  Bread, cereal, rice, pasta, and crackers.  Potatoes and corn.  Peas, beans, and lentils.  Milk and yogurt.  Fruit and juice.  Desserts, such as cakes, cookies, ice cream, and candy. How does alcohol affect me? Alcohol can cause a sudden  decrease in blood glucose (hypoglycemia), especially if you use insulin or take certain oral diabetes medicines. Hypoglycemia can be a life-threatening condition. Symptoms of hypoglycemia (sleepiness, dizziness, and confusion) are similar to symptoms of having too much alcohol. If your health care provider says that alcohol is safe for you, follow these guidelines:  Limit alcohol intake to no more than 1 drink per day for nonpregnant women and 2 drinks per day for men. One drink equals 12 oz of beer, 5 oz of wine, or 1 oz of hard liquor.  Do not drink on an empty stomach.  Keep yourself hydrated with water, diet soda, or unsweetened iced tea.  Keep in mind that regular soda, juice, and other mixers may contain a lot of sugar and must be  counted as carbs. What are tips for following this plan?  Reading food labels  Start by checking the serving size on the "Nutrition Facts" label of packaged foods and drinks. The amount of calories, carbs, fats, and other nutrients listed on the label is based on one serving of the item. Many items contain more than one serving per package.  Check the total grams (g) of carbs in one serving. You can calculate the number of servings of carbs in one serving by dividing the total carbs by 15. For example, if a food has 30 g of total carbs, it would be equal to 2 servings of carbs.  Check the number of grams (g) of saturated and trans fats in one serving. Choose foods that have low or no amount of these fats.  Check the number of milligrams (mg) of salt (sodium) in one serving. Most people should limit total sodium intake to less than 2,300 mg per day.  Always check the nutrition information of foods labeled as "low-fat" or "nonfat". These foods may be higher in added sugar or refined carbs and should be avoided.  Talk to your dietitian to identify your daily goals for nutrients listed on the label. Shopping  Avoid buying canned, premade, or processed foods.  These foods tend to be high in fat, sodium, and added sugar.  Shop around the outside edge of the grocery store. This includes fresh fruits and vegetables, bulk grains, fresh meats, and fresh dairy. Cooking  Use low-heat cooking methods, such as baking, instead of high-heat cooking methods like deep frying.  Cook using healthy oils, such as olive, canola, or sunflower oil.  Avoid cooking with butter, cream, or high-fat meats. Meal planning  Eat meals and snacks regularly, preferably at the same times every day. Avoid going long periods of time without eating.  Eat foods high in fiber, such as fresh fruits, vegetables, beans, and whole grains. Talk to your dietitian about how many servings of carbs you can eat at each meal.  Eat 4-6 ounces (oz) of lean protein each day, such as lean meat, chicken, fish, eggs, or tofu. One oz of lean protein is equal to: ? 1 oz of meat, chicken, or fish. ? 1 egg. ?  cup of tofu.  Eat some foods each day that contain healthy fats, such as avocado, nuts, seeds, and fish. Lifestyle  Check your blood glucose regularly.  Exercise regularly as told by your health care provider. This may include: ? 150 minutes of moderate-intensity or vigorous-intensity exercise each week. This could be brisk walking, biking, or water aerobics. ? Stretching and doing strength exercises, such as yoga or weightlifting, at least 2 times a week.  Take medicines as told by your health care provider.  Do not use any products that contain nicotine or tobacco, such as cigarettes and e-cigarettes. If you need help quitting, ask your health care provider.  Work with a Social worker or diabetes educator to identify strategies to manage stress and any emotional and social challenges. Questions to ask a health care provider  Do I need to meet with a diabetes educator?  Do I need to meet with a dietitian?  What number can I call if I have questions?  When are the best times to check  my blood glucose? Where to find more information:  American Diabetes Association: diabetes.org  Academy of Nutrition and Dietetics: www.eatright.[employer] of Diabetes and Digestive and Kidney Diseases (NIH): [employer] Summary  A healthy meal plan  will help you control your blood glucose and maintain a healthy lifestyle.  Working with a diet and nutrition specialist (dietitian) can help you make a meal plan that is best for you.  Keep in mind that carbohydrates (carbs) and alcohol have immediate effects on your blood glucose levels. It is important to count carbs and to use alcohol carefully. This information is not intended to replace advice given to you by your health care provider. Make sure you discuss any questions you have with your health care provider. Document Revised: 11/30/2016 Document Reviewed: 01/23/2016 Elsevier Patient Education  2020 [place], MD [facility]

## 2019-01-06 NOTE — Patient Instructions (Signed)
-Nice seeing you today!!  -Lab work today; will notify you once results are available.  -Check CBG fasting in am, before lunch and before dinner and bring numbers into your next visit.  -Start Basaglar 10 units at bedtime and humalog with your sliding scale (please bring your sliding scale into next visit).  -Referral to endocrinology has been placed.  -Schedule follow up in 6 weeks.   Diabetes Mellitus and Nutrition, Adult When you have diabetes (diabetes mellitus), it is very important to have healthy eating habits because your blood sugar (glucose) levels are greatly affected by what you eat and drink. Eating healthy foods in the appropriate amounts, at about the same times every day, can help you:  Control your blood glucose.  Lower your risk of heart disease.  Improve your blood pressure.  Reach or maintain a healthy weight. Every person with diabetes is different, and each person has different needs for a meal plan. Your health care provider may recommend that you work with a diet and nutrition specialist (dietitian) to make a meal plan that is best for you. Your meal plan may vary depending on factors such as:  The calories you need.  The medicines you take.  Your weight.  Your blood glucose, blood pressure, and cholesterol levels.  Your activity level.  Other health conditions you have, such as heart or kidney disease. How do carbohydrates affect me? Carbohydrates, also called carbs, affect your blood glucose level more than any other type of food. Eating carbs naturally raises the amount of glucose in your blood. Carb counting is a method for keeping track of how many carbs you eat. Counting carbs is important to keep your blood glucose at a healthy level, especially if you use insulin or take certain oral diabetes medicines. It is important to know how many carbs you can safely have in each meal. This is different for every person. Your dietitian can help you calculate  how many carbs you should have at each meal and for each snack. Foods that contain carbs include:  Bread, cereal, rice, pasta, and crackers.  Potatoes and corn.  Peas, beans, and lentils.  Milk and yogurt.  Fruit and juice.  Desserts, such as cakes, cookies, ice cream, and candy. How does alcohol affect me? Alcohol can cause a sudden decrease in blood glucose (hypoglycemia), especially if you use insulin or take certain oral diabetes medicines. Hypoglycemia can be a life-threatening condition. Symptoms of hypoglycemia (sleepiness, dizziness, and confusion) are similar to symptoms of having too much alcohol. If your health care provider says that alcohol is safe for you, follow these guidelines:  Limit alcohol intake to no more than 1 drink per day for nonpregnant women and 2 drinks per day for men. One drink equals 12 oz of beer, 5 oz of wine, or 1 oz of hard liquor.  Do not drink on an empty stomach.  Keep yourself hydrated with water, diet soda, or unsweetened iced tea.  Keep in mind that regular soda, juice, and other mixers may contain a lot of sugar and must be counted as carbs. What are tips for following this plan?  Reading food labels  Start by checking the serving size on the "Nutrition Facts" label of packaged foods and drinks. The amount of calories, carbs, fats, and other nutrients listed on the label is based on one serving of the item. Many items contain more than one serving per package.  Check the total grams (g) of carbs in one serving.  You can calculate the number of servings of carbs in one serving by dividing the total carbs by 15. For example, if a food has 30 g of total carbs, it would be equal to 2 servings of carbs.  Check the number of grams (g) of saturated and trans fats in one serving. Choose foods that have low or no amount of these fats.  Check the number of milligrams (mg) of salt (sodium) in one serving. Most people should limit total sodium intake  to less than 2,300 mg per day.  Always check the nutrition information of foods labeled as "low-fat" or "nonfat". These foods may be higher in added sugar or refined carbs and should be avoided.  Talk to your dietitian to identify your daily goals for nutrients listed on the label. Shopping  Avoid buying canned, premade, or processed foods. These foods tend to be high in fat, sodium, and added sugar.  Shop around the outside edge of the grocery store. This includes fresh fruits and vegetables, bulk grains, fresh meats, and fresh dairy. Cooking  Use low-heat cooking methods, such as baking, instead of high-heat cooking methods like deep frying.  Cook using healthy oils, such as olive, canola, or sunflower oil.  Avoid cooking with butter, cream, or high-fat meats. Meal planning  Eat meals and snacks regularly, preferably at the same times every day. Avoid going long periods of time without eating.  Eat foods high in fiber, such as fresh fruits, vegetables, beans, and whole grains. Talk to your dietitian about how many servings of carbs you can eat at each meal.  Eat 4-6 ounces (oz) of lean protein each day, such as lean meat, chicken, fish, eggs, or tofu. One oz of lean protein is equal to: ? 1 oz of meat, chicken, or fish. ? 1 egg. ?  cup of tofu.  Eat some foods each day that contain healthy fats, such as avocado, nuts, seeds, and fish. Lifestyle  Check your blood glucose regularly.  Exercise regularly as told by your health care provider. This may include: ? 150 minutes of moderate-intensity or vigorous-intensity exercise each week. This could be brisk walking, biking, or water aerobics. ? Stretching and doing strength exercises, such as yoga or weightlifting, at least 2 times a week.  Take medicines as told by your health care provider.  Do not use any products that contain nicotine or tobacco, such as cigarettes and e-cigarettes. If you need help quitting, ask your health  care provider.  Work with a Social worker or diabetes educator to identify strategies to manage stress and any emotional and social challenges. Questions to ask a health care provider  Do I need to meet with a diabetes educator?  Do I need to meet with a dietitian?  What number can I call if I have questions?  When are the best times to check my blood glucose? Where to find more information:  American Diabetes Association: diabetes.org  Academy of Nutrition and Dietetics: www.eatright.[employer] of Diabetes and Digestive and Kidney Diseases (NIH): [employer] Summary  A healthy meal plan will help you control your blood glucose and maintain a healthy lifestyle.  Working with a diet and nutrition specialist (dietitian) can help you make a meal plan that is best for you.  Keep in mind that carbohydrates (carbs) and alcohol have immediate effects on your blood glucose levels. It is important to count carbs and to use alcohol carefully. This information is not intended to replace advice given to you  by your health care provider. Make sure you discuss any questions you have with your health care provider. Document Revised: 11/30/2016 Document Reviewed: 01/23/2016 Elsevier Patient Education  2020 [employer].

## 2019-01-07 ENCOUNTER — Encounter: Payer: Self-pay | Admitting: Internal Medicine

## 2019-01-07 ENCOUNTER — Other Ambulatory Visit: Payer: Self-pay | Admitting: Internal Medicine

## 2019-01-07 DIAGNOSIS — R809 Proteinuria, unspecified: Secondary | ICD-10-CM | POA: Insufficient documentation

## 2019-01-07 DIAGNOSIS — E559 Vitamin D deficiency, unspecified: Secondary | ICD-10-CM

## 2019-01-07 DIAGNOSIS — E1165 Type 2 diabetes mellitus with hyperglycemia: Secondary | ICD-10-CM | POA: Insufficient documentation

## 2019-01-07 MED ORDER — LISINOPRIL 5 MG PO TABS
5.0000 mg | ORAL_TABLET | Freq: Every day | ORAL | 1 refills | Status: DC
Start: 2019-01-07 — End: 2019-08-04

## 2019-01-07 MED ORDER — VITAMIN D (ERGOCALCIFEROL) 1.25 MG (50000 UNIT) PO CAPS: 50000.0000 [IU] | ORAL_CAPSULE | ORAL | 0 refills | Status: DC

## 2019-01-14 ENCOUNTER — Telehealth (INDEPENDENT_AMBULATORY_CARE_PROVIDER_SITE_OTHER): Payer: HMO | Admitting: Internal Medicine

## 2019-01-14 ENCOUNTER — Telehealth: Payer: Self-pay | Admitting: *Deleted

## 2019-01-14 ENCOUNTER — Other Ambulatory Visit: Payer: Self-pay

## 2019-01-14 DIAGNOSIS — E1165 Type 2 diabetes mellitus with hyperglycemia: Secondary | ICD-10-CM

## 2019-01-14 DIAGNOSIS — E1040 Type 1 diabetes mellitus with diabetic neuropathy, unspecified: Secondary | ICD-10-CM

## 2019-01-14 DIAGNOSIS — E559 Vitamin D deficiency, unspecified: Secondary | ICD-10-CM

## 2019-01-14 DIAGNOSIS — R809 Proteinuria, unspecified: Secondary | ICD-10-CM

## 2019-01-14 DIAGNOSIS — G8929 Other chronic pain: Secondary | ICD-10-CM | POA: Diagnosis not present

## 2019-01-14 DIAGNOSIS — M25561 Pain in right knee: Secondary | ICD-10-CM | POA: Diagnosis not present

## 2019-01-14 MED ORDER — DROPLET PEN NEEDLES 32G X 4 MM MISC: 5 refills | Status: DC

## 2019-01-14 MED ORDER — INSULIN LISPRO (1 UNIT DIAL) 100 UNIT/ML (KWIKPEN): 16.0000 [IU] | PEN_INJECTOR | Freq: Three times a day (TID) | SUBCUTANEOUS | 5 refills | Status: DC

## 2019-01-14 MED ORDER — INSULIN GLARGINE 100 UNIT/ML SOLOSTAR PEN: 10.0000 [IU] | PEN_INJECTOR | Freq: Every day | SUBCUTANEOUS | 5 refills | Status: DC

## 2019-01-14 MED ORDER — FREESTYLE LIBRE 14 DAY SENSOR MISC: 1.0000 | Freq: Every day | 5 refills | Status: DC

## 2019-01-14 MED ORDER — GABAPENTIN 300 MG PO CAPS: 300.0000 mg | ORAL_CAPSULE | Freq: Every day | ORAL | 1 refills | Status: DC

## 2019-01-14 NOTE — Telephone Encounter (Signed)
Spoke with patient and scheduled appointment. Copied from [place] [phone]. Topic: Appointment Scheduling - Scheduling Inquiry for Clinic >> Jan 13, 2019  6:05 PM Alease Frame wrote: Reason for CRM: patient is trying to sch an appt for [id number]. Please advise

## 2019-01-14 NOTE — Progress Notes (Signed)
Virtual Visit via Telephone Note  I connected with [name] [name] on 01/14/19 at  2:30 PM EST by telephone and verified that I am speaking with the correct person using two identifiers.   I discussed the limitations, risks, security and privacy concerns of performing an evaluation and management service by telephone and the availability of in person appointments. I also discussed with the patient that there may be a patient responsible charge related to this service. The patient expressed understanding and agreed to proceed.  Location patient: home Location provider: work office Participants present for the call: patient, provider Patient did not have a visit in the prior 7 days to address this/these issue(s).   History of Present Illness:  This visit has been scheduled for several reasons:  1.  We have attempted to reach him several times regarding the results of his recent lab work.  He was found to have microalbuminuria with uncontrolled diabetes, significant vitamin D deficiency.  2.  He needs "sensor patches" for his freestyle libre prescription.  3.  He would like to discuss medication management of his right knee pain.  He has had this thoroughly worked up in the past year through the [facility].  He had an MRI in May 2020 that showed "degenerative changes to the posterior horn of the lateral meniscus without visible tear.  Mild quadriceps tendinopathy with recurrent impingement of intra-articular fat, tricompartmental chondromalacia.  He visited an orthopedics out of town and it was their opinion that there was nothing to do.  He states he received a cortisone injection which was of no help.  He has been unable to sleep or walk because of the pain.   Observations/Objective: Patient sounds cheerful and well on the phone. I do not appreciate any increased work of breathing. Speech and thought processing are grossly intact. Patient reported vitals: None  reported   Current Outpatient Medications:  .  Ascorbic Acid (VITAMIN C) 100 MG tablet, Take 100 mg by mouth daily., Disp: , Rfl:  .  aspirin 325 MG EC tablet, Take 325 mg by mouth daily., Disp: , Rfl:  .  atorvastatin (LIPITOR) 40 MG tablet, Take 40 mg by mouth daily., Disp: , Rfl:  .  cholecalciferol (VITAMIN D3) 25 MCG (1000 UT) tablet, Take 1,000 Units by mouth daily., Disp: , Rfl:  .  Continuous Blood Gluc Sensor (FREESTYLE LIBRE 14 DAY SENSOR) MISC, 1 each by Does not apply route daily., Disp: 2 each, Rfl: 5 .  FLUoxetine (PROZAC) 20 MG tablet, Take by mouth., Disp: , Rfl:  .  gabapentin (NEURONTIN) 300 MG capsule, Take 1 capsule (300 mg total) by mouth at bedtime., Disp: 90 capsule, Rfl: 1 .  glipiZIDE (GLUCOTROL) 10 MG tablet, Take 10 mg by mouth daily., Disp: , Rfl:  .  glucose blood test strip, 1 each by Other route as needed for other. Use as instructed, Disp: , Rfl:  .  HYDROcodone-acetaminophen (NORCO) 5-325 MG per tablet, 1-2 tabs po q6 hours prn pain, Disp: 30 tablet, Rfl: 0 .  insulin aspart (NOVOLOG) 100 UNIT/ML injection, Inject into the skin 3 (three) times daily before meals., Disp: , Rfl:  .  Insulin Glargine (LANTUS) 100 UNIT/ML Solostar Pen, Inject 10 Units into the skin daily., Disp: 15 mL, Rfl: 5 .  insulin lispro (HUMALOG) 100 UNIT/ML KwikPen, Inject 0.16 mLs (16 Units total) into the skin 3 (three) times daily., Disp: 15 mL, Rfl: 5 .  Insulin Pen Needle (DROPLET PEN NEEDLES)  32G X 4 MM MISC, Use daily for glucose control, Disp: 100 each, Rfl: 5 .  lisinopril (ZESTRIL) 5 MG tablet, Take 1 tablet (5 mg total) by mouth daily., Disp: 90 tablet, Rfl: 1 .  metFORMIN (GLUCOPHAGE) 1000 MG tablet, Take 1,000 mg by mouth 2 (two) times daily with a meal., Disp: , Rfl:  .  Vitamin D, Ergocalciferol, (DRISDOL) 1.25 MG (50000 UT) CAPS capsule, Take 1 capsule (50,000 Units total) by mouth every 7 (seven) days for 12 doses., Disp: 12 capsule, Rfl: 0  Review of  Systems:  Constitutional: Denies fever, chills, diaphoresis, appetite change and fatigue.  HEENT: Denies photophobia, eye pain, redness, hearing loss, ear pain, congestion, sore throat, rhinorrhea, sneezing, mouth sores, trouble swallowing, neck pain, neck stiffness and tinnitus.   Respiratory: Denies SOB, DOE, cough, chest tightness,  and wheezing.   Cardiovascular: Denies chest pain, palpitations and leg swelling.  Gastrointestinal: Denies nausea, vomiting, abdominal pain, diarrhea, constipation, blood in stool and abdominal distention.  Genitourinary: Denies dysuria, urgency, frequency, hematuria, flank pain and difficulty urinating.  Endocrine: Denies: hot or cold intolerance, sweats, changes in hair or nails, polyuria, polydipsia. Musculoskeletal: Denies myalgias, back pain. Skin: Denies pallor, rash and wound.  Neurological: Denies dizziness, seizures, syncope, weakness, light-headedness, numbness and headaches.  Hematological: Denies adenopathy. Easy bruising, personal or family bleeding history  Psychiatric/Behavioral: Denies suicidal ideation, mood changes, confusion, nervousness, sleep disturbance and agitation   Assessment and Plan:  Uncontrolled type 2 diabetes mellitus with hyperglycemia ([name]) -Send prescriptions for Lantus and Humalog. -Referral to endocrinology was placed on prior visit.  Microalbuminuria -Start lisinopril 5 mg for renal protection.  Vitamin D deficiency -Has been started on weekly high-dose supplementation.  Chronic pain of right knee  - Plan: AMB referral to orthopedics for second opinion. -Try gabapentin as part of his issue may be neuropathy, this may also help with insomnia.  Type 1 diabetes mellitus with diabetic neuropathy, unspecified ([name]) - Plan: gabapentin (NEURONTIN) 300 MG capsule    I discussed the assessment and treatment plan with the patient. The patient was provided an opportunity to ask questions and all were answered. The patient  agreed with the plan and demonstrated an understanding of the instructions.   The patient was advised to call back or seek an in-person evaluation if the symptoms worsen or if the condition fails to improve as anticipated.  I provided 28 minutes of non-face-to-face time during this encounter.   Lelon Frohlich, MD [facility]

## 2019-01-16 ENCOUNTER — Telehealth: Payer: Self-pay | Admitting: Internal Medicine

## 2019-01-16 MED ORDER — NOVOLOG FLEXPEN 100 UNIT/ML ~~LOC~~ SOPN: 16.0000 [IU] | PEN_INJECTOR | Freq: Three times a day (TID) | SUBCUTANEOUS | 3 refills | Status: DC

## 2019-01-16 NOTE — Telephone Encounter (Signed)
Ok to switch 

## 2019-01-16 NOTE — Telephone Encounter (Signed)
Insurance called.  insulin lispro (HUMALOG) 100 UNIT/ML KwikPen  Is not covered under the formulary and is more expensive.  Pt is asking for something else to be called in.  Per insurance Novalog is covered, if that is an equal substitute.   Pt asking for call back at [phone]  He is out of medication.

## 2019-01-16 NOTE — Telephone Encounter (Signed)
Spoke to pt and advised that the message will be routed. Pt also advised that it may be Monday before we could get this filled. Pt verbalized understanding.

## 2019-01-20 NOTE — Telephone Encounter (Signed)
Done

## 2019-01-21 ENCOUNTER — Encounter: Payer: Self-pay | Admitting: Internal Medicine

## 2019-01-27 ENCOUNTER — Other Ambulatory Visit: Payer: Self-pay | Admitting: *Deleted

## 2019-01-27 NOTE — Patient Outreach (Signed)
  [facility]) Care Management Chronic Special Needs Program    01/27/2019  Name: [name] [name], DOB: July 07, 1955  MRN: [id number]   [name] is enrolled in a chronic special needs plan for Diabetes. Reached [name] via mobile number, explained purpose of call, to complete initial telephone assessment. He requests the assessment be completed at another time.  Plan: With client's agreement, initial telephone assessment scheduled for 01/29/19 at 9:30 am.  Kelli Churn RN, CCM, [place] Management Coordinator [place] Management [phone]

## 2019-01-29 ENCOUNTER — Other Ambulatory Visit: Payer: Self-pay | Admitting: *Deleted

## 2019-01-29 ENCOUNTER — Encounter: Payer: Self-pay | Admitting: *Deleted

## 2019-01-29 DIAGNOSIS — E1165 Type 2 diabetes mellitus with hyperglycemia: Secondary | ICD-10-CM

## 2019-01-29 DIAGNOSIS — R809 Proteinuria, unspecified: Secondary | ICD-10-CM

## 2019-01-29 NOTE — Patient Outreach (Signed)
[facility]) Care Management Chronic Special Needs Program  01/29/2019  Name: [name] [name] DOB: Apr 22, 1955  MRN: [id number]  [name] [name] is enrolled in a chronic special needs plan for Diabetes. Chronic Care Management Coordinator telephoned client to review health risk assessment and to develop individualized care plan.  Introduced the chronic care management program, importance of client participation, and taking their care plan to all provider appointments and inpatient facilities.  Reviewed the transition of care process and possible referral to community care management.  Subjective: [name] [name] states he is depressed and discouraged with the poor state of his health, and feels overwhelmed with management of his chronic health issues, specifically his diabetes and his chronic right knee pain. He says he has much difficulty sleeping primarily due to his knee pain and reports the knee pain sometimes interferes with his ability to drive. He says he is taking his Gabapentin as directed. He says he was able to sleep well last night because he took a Mirtazapine that was prescribed by his former primary care provider and he is asking if this is OK to continue to do.  He says he has the [employer] flash glucose monitoring system but says he does not know how to use it.  He says he does not have a glucometer so he says he "just guesses" at how much mealtime insulin to give himself. He says he carries glucose tablets with him at all times. While completing the medication review, he indicated he did not know which pen was his long acting vs the mealtime insulin. After teaching him the difference, he stated he had been using the Basaglar at mealtime and the novolog at nighttime. He is expressing the wish "to meet with someone in person that can help me with my diabetes".  He also says he has never seen an endocrinologist but was told by his provider that a referral was  made.He says he has not been contacted about the initial endocrinology appointment.   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 who  is a court reporter. He says they have not seen each other in person in a long while but they talk on the phone daily.  Goals Addressed            This Visit's Progress     Patient Stated   . "I'm fearful of dying and depressed because of my poor health" (pt-stated)       Discussed patient's concerns. Reviewed health plans benefits that include this RNCM to provider health education and support with chronic disease self management , licensed clinical social worker (LCSW) for counseling and with client's agreement placed referral to LCSW, pharmacist to help with medication costs/concerns when they arise- referral placed to pharmacist for client's questions about resuming Mirtazapine and suggested gabapentin dose to treat knee pain Discussed strategies to address chronic knee pain (discuss increasing dosage of Gabapentin with provider) and glucose management       Other   . " I can't sleep , I took a mirtazapine last night and it really helped, is that OK to keep doing?"       Discussed purpose of Mirtazapine and asked him to consult with his provider about resuming the medication.    . Client understands the importance of follow-up with providers by attending scheduled visits      . Client will  report improved coping with in the next 3 months       Provided client with opportunity to discuss feelings and concerns and emotional support provided Discussed coping strategies he has used in the past and depression causes and treatment  Referred client to [place] Management licensed clinical social worker for depression assessment and counseling    . Client will report no worsening of symptoms related to heart disease within the next 3 months       Assessed current cardiac  status Mailed Emmi education "Heart Disease In Diabetes" to client's home address    . Client will verbalize knowledge of diabetes self-management as evidenced by Hgb A1C <7 or as defined by provider.   Not on track    Most recent Hgb A1C = 15.1% on 01/06/19 Discussed diabetes self management actions:  Reviewed the difference between basal and rapid acting insulin pens and requested client write the type of insulin on the pen  Glucose monitoring per provider recommendations via [place]- with client's permission emailed client video and written instructions on how to use the [employer] flash glucose monitoring system  Stressed importance of monitoring glucose especially when taking insulin  Assessed how client treats hypoglycemia  Consider SGLT2 in light of client's CAD and albuminuria  Discussed health plan's diabetes self management education benefit with client; with client's agreement will ask  provider for referral for diabetes self management education  Contact provider about status of endocrinology referral    . HEMOGLOBIN A1C < 7.0       Hgb A1C= 15.1 on 01/06/19    . Maintain timely refills of diabetic medication as prescribed within the year .       Client states he takes medications as prescribed and review of medication dispense report indicates all prescribed medications were recently filled    . COMPLETED: Obtain annual  Lipid Profile, LDL-C   On track    Lipid Profile completed on 01/06/19, LDL= 53    . Obtain Annual Eye (retinal)  Exam       . Obtain Annual Foot Exam      . Obtain annual screen for micro albuminuria (urine) , nephropathy (kidney problems)   On track    Urine microalbumin was completed on 01/06/19, positive for protein so Lisinopril started on 01/06/19 Discussed test results and mailed Emmi education "Urine Albumin Test" and "Diabetic Nephropathy" to client's home address    . Obtain Hemoglobin A1C at least 2 times per year   On track    11/12/18  Hgb A1C= 14.8% 01/06/19  Hgb A1C= 15.1%    . Visit Primary Care Provider or Endocrinologist at least 2 times per year    On track    Client has completed appointment with primary care provider on 01/06/19 and 11/12/18      Assessment: Client is not meeting diabetes self-management goal of hemoglobin A1C of <7% with most recent reading of 15.1% on 01/06/19. Client has good understanding of:  COVID-19 cause, symptoms, precautions (social distancing, stay at home order, hand washing, wear face covering when unable to maintain or ensure 6 foot social distancing), and symptoms requiring provider notification.  Plan:   Send successful outreach letter with a copy of their individualized care plan to client Send individual care plan to provider  Send educational material on heart disease and diabetes, urine albumin test and nephropathy Chronic care management coordination will outreach in:  1 week to assess client's comfort with using [employer]  refer client to:  Social Work  for depression assessment and counseling                                   Pharmacy for medication recommendations.  Kelli Churn RN, CCM, [place] Management Coordinator [facility] Care Management [phone]

## 2019-01-30 ENCOUNTER — Telehealth: Payer: Self-pay | Admitting: Internal Medicine

## 2019-01-30 ENCOUNTER — Other Ambulatory Visit: Payer: Self-pay | Admitting: *Deleted

## 2019-01-30 ENCOUNTER — Telehealth: Payer: Self-pay | Admitting: Pharmacist

## 2019-01-30 DIAGNOSIS — E1165 Type 2 diabetes mellitus with hyperglycemia: Secondary | ICD-10-CM

## 2019-01-30 NOTE — Telephone Encounter (Signed)
Yes

## 2019-01-30 NOTE — Telephone Encounter (Signed)
Referral placed.

## 2019-01-30 NOTE — Patient Outreach (Addendum)
[facility]) Care Management  01/30/2019  [name] [name] August 15, 1955 [id number]   Patient was called regarding medication assistance and management. Unfortunately, he did not answer the phone. HIPAA compliant message was left on his voicemail.  Referral also mentioned Mirtazapine at bedtime for insomnia. Some increased serotonergic activity may be seen with Mirtazapine and Fluoxetine in combination but it is unclear if this would be clinically significant.   Plan: Send patient an unsuccessful outreach letter. Call patient back in 7-10 business days.  Elayne Guerin, PharmD, [place] Clinical Pharmacist [phone]

## 2019-01-30 NOTE — Telephone Encounter (Signed)
Okay to refer? 

## 2019-01-30 NOTE — Patient Outreach (Signed)
  [facility]) Care Management Chronic Special Needs Program    01/30/2019  Name: [name] [name], DOB: [birth year]  MRN: [id number]   [name] is enrolled in a chronic special needs plan for Diabetes. Received call from Dr. Jerilee Hoh' office advising this RNCM a referral has been made for client to receive diabetes self management education at [facility]. Asked caller to thank Dr. Jerilee Hoh for the referral.  Kelli Churn RN, CCM, [place] Management Coordinator [facility] Management [phone]

## 2019-01-30 NOTE — Addendum Note (Signed)
Addended by: Elayne Guerin on: 01/30/2019 03:41 PM   Modules accepted: Orders

## 2019-01-30 NOTE — Patient Outreach (Signed)
  [place] [facility]) Care Management Chronic Special Needs Program    01/30/2019  Name: [name] [name], DOB: July 03, 1955  MRN: [id number]   Mr. Emmanuel Busler is enrolled in a chronic special needs plan for Diabetes. Left message with Dr. Jerilee Hoh ' office requesting referral for client to [place] [facility] for diabetes self management education.  Kelli Churn RN, CCM, [place] Management Coordinator [facility] Care Management [phone]

## 2019-01-30 NOTE — Telephone Encounter (Signed)
Ralph Dowdy is a case worker with [facility] and is also a diabetes educator. Marcie Bal is requesting a referral for diabetes care.  Patient is agreeing to this and insurance will cover referral. Thanks   Janet's # [phone]

## 2019-02-02 ENCOUNTER — Telehealth: Payer: Self-pay | Admitting: Internal Medicine

## 2019-02-02 NOTE — Telephone Encounter (Signed)
Pt would like for Dr. Jerilee Hoh call in a prescription for the 14 day Libre Meter for his Blood Gluc. [facility]: [facility] [address] Pt can be reached at [phone] if needed

## 2019-02-03 ENCOUNTER — Other Ambulatory Visit: Payer: Self-pay | Admitting: Pharmacist

## 2019-02-03 MED ORDER — FREESTYLE LIBRE 14 DAY SENSOR MISC: 1.0000 | Freq: Every day | 5 refills | Status: DC

## 2019-02-03 MED ORDER — FREESTYLE LIBRE 14 DAY READER DEVI: 1.0000 | Freq: Every day | 0 refills | Status: DC

## 2019-02-03 NOTE — Patient Outreach (Signed)
. [name] [name]) Care Management  [name] [name]   02/03/2019  [name] [name] [birth year] [id number]  Reason for referral: Medication Review  Referral source: [employer] Care Manager with [name] Current insurance: [employer]  PMHx includes but not limited to:   Type 2 diabetes, vitamin D deficiency, depression and CAD.   Outreach:  Successful telephone call with patient.  HIPAA identifiers verified.   Subjective:  Patient left a message on [facility] CNSP Nurse's voicemail stating that he had a question about a medication interaction. Since I had reached out to him last week, she requested that I call him back today.  Objective: The ASCVD Risk score [name] [name]., et al., 2013) failed to calculate for the following reasons:   The valid total cholesterol range is 130 to 320 mg/dL  Lab Results  Component Value Date   CREATININE 0.64 01/06/2019   CREATININE 1.25 (H) 06/09/2018   CREATININE 0.55 03/11/2013    Lab Results  Component Value Date   HGBA1C 15.1 (H) 01/06/2019    Lipid Panel     Component Value Date/Time   CHOL 129 01/06/2019 1446   TRIG 166.0 (H) 01/06/2019 1446   HDL 43.10 01/06/2019 1446   CHOLHDL 3 01/06/2019 1446   VLDL 33.2 01/06/2019 1446   LDLCALC 53 01/06/2019 1446    BP Readings from Last 3 Encounters:  01/06/19 130/78  03/12/13 115/76  01/20/13 118/76    No Known Allergies  Medications Reviewed Today    Reviewed by [name] [name], [place] (Pharmacist) on 02/03/19 at 1544  Med List Status: <None>  Medication Order Taking? Sig Documenting Provider Last Dose Status Informant  Ascorbic Acid (VITAMIN C) 100 MG tablet [id number] Yes Take 100 mg by mouth daily. [provider] Taking Active   aspirin 325 MG EC tablet [id number] Yes Take 325 mg by mouth daily. [provider] Taking Active   atorvastatin (LIPITOR) 40 MG tablet [id number] Yes Take 40 mg by mouth daily. [provider]  Taking Active   cholecalciferol (VITAMIN D3) 25 MCG (1000 UT) tablet [id number] Yes Take 1,000 Units by mouth daily. [provider] Taking Active            Med Note [name] [name], [place] Jan 29, 2019  1:46 PM) Currently taking 5000 IU dose- see other Vit D med order  Continuous Blood Gluc Receiver (FREESTYLE LIBRE 14 DAY READER) [place] [id number] Yes 1 each by Does not apply route daily. [name] [name], [name] Halsted, MD Taking Active   Continuous Blood Gluc Sensor (FREESTYLE LIBRE [place]) [place] [id number] Yes 1 each by Does not apply route daily. [name] [name], [name] Halsted, MD Taking Active   gabapentin (NEURONTIN) 300 MG capsule [id number] Yes Take 1 capsule (300 mg total) by mouth at bedtime. [name] [name], [name] Halsted, MD Taking Active   insulin aspart (NOVOLOG FLEXPEN) 100 UNIT/ML FlexPen [id number] Yes Inject 16 Units into the skin 3 (three) times daily with meals.  Patient taking differently: Inject 10 Units into the skin 3 (three) times daily with meals. With sliding scale: CBG<200 no additional novolog CBG 201-250 4 units CBG 251-300 6 units CBG 300-350 8 units CBG 151-400 10 units   [name] [name], [name] Halsted, MD Taking Active Self  insulin glargine (LANTUS) 100 UNIT/ML injection [id number] Yes Inject 10 Units into the skin daily. [provider] Taking Active   Insulin Pen Needle (DROPLET PEN NEEDLES) 32G X 4 MM MISC  [id number] Yes Use daily for glucose control [name] [name], [name] Halsted, MD Taking Active   lisinopril (ZESTRIL) 5 MG tablet [id number] Yes Take 1 tablet (5 mg total) by mouth daily. [name] [name], [name] Halsted, MD Taking Active   metFORMIN (GLUCOPHAGE) 1000 MG tablet [id number] Yes Take 1,000 mg by mouth 2 (two) times daily with a meal. [provider] Taking Active   PARoxetine (PAXIL) 20 MG tablet [id number] Yes Take 20 mg by mouth daily. [provider] Taking Active           Assessment: Drugs sorted by  system:  Neurologic/Psychologic: Mirtazapine, Paroxetine, Gabapentin,   Cardiovascular:  Aspirin, Atorvastatin  Endocrine: Novolog, Lantus, Metformin,   Vitamins/Minerals/Supplements: Cholecalciferol,   Medication Review Findings:   Patient said he wanted to know if he could take both paroxetine and mirtazapine at the same time. He said he spoke to his local Retail Pharmacist who told him he could take them both at bedtime.  When taken together, mirtazapine and paroxetine can cause increased serotonergic effects like nausea, vomiting, headache, dry mouth, nervousness, agitation, and altered sexual experience. Patient has been taking them together without issue so he was told to watch out for any new symptoms and report them to his physician.  Patient said Mirtazapine 30mg  was prescribed by [name] [name] over the summer and he stopped taking it because he was taking Oxycodone at the time and did not like the way he felt when he was taking both.   Now that he is not taking any pain relievers, the mirtazapine helps him sleep.  HgA1c- >15% Patient admitted to mixing up Basaglar and Humalog previously but reported totally understanding his current therapy since he is back on what he was on before, Lantus and Novolog.      Medication Assistance Findings:  No medication assistance needs identified  Patient said he did not want to explore medication assistance at this time. He was agreeable to looking at things in a few months.   Plan: . Will follow-up in 2 months.  [name] [name] Nurse is working with patient on his diabetes.  [name] [name], PharmD, [place] Clinical Pharmacist [phone]

## 2019-02-03 NOTE — Telephone Encounter (Signed)
Refill sent.

## 2019-02-05 ENCOUNTER — Other Ambulatory Visit: Payer: Self-pay | Admitting: *Deleted

## 2019-02-05 ENCOUNTER — Ambulatory Visit: Payer: Self-pay | Admitting: *Deleted

## 2019-02-05 DIAGNOSIS — I2510 Atherosclerotic heart disease of native coronary artery without angina pectoris: Secondary | ICD-10-CM | POA: Insufficient documentation

## 2019-02-05 DIAGNOSIS — I10 Essential (primary) hypertension: Secondary | ICD-10-CM | POA: Insufficient documentation

## 2019-02-05 DIAGNOSIS — E785 Hyperlipidemia, unspecified: Secondary | ICD-10-CM | POA: Insufficient documentation

## 2019-02-05 DIAGNOSIS — I7100 Dissection of unspecified site of aorta: Secondary | ICD-10-CM | POA: Insufficient documentation

## 2019-02-05 DIAGNOSIS — L409 Psoriasis, unspecified: Secondary | ICD-10-CM | POA: Insufficient documentation

## 2019-02-05 NOTE — Patient Outreach (Signed)
  [place] [facility]) Care Management Chronic Special Needs Program    02/05/2019  Name: [name] [name], DOB: 29-Nov-1955  MRN: [id number]   [name] [name] is enrolled in a chronic special needs plan for Diabetes. Attempted to reach client via contact number for scheduled follow up telephone assessment, no answer. HIPAA compliant voice message left requesting return call.  Review of electronic medical record indicates [name] [name] is scheduled for diabetes self management education on 02/23/19 and that a referral was made by primary care provider on 01/14/19 to [facility] related to client's chronic right knee pain. Also noted in record review that at this RNCM's request, after client left a message for this [facility] requesting return call,  [place] Management pharmacist Denyse Amass spoke with client on 01/30/19 to address his questions/concerns regarding medication interactions.  Plan: If no return call from client, will attempt another outreach within one week.  Kelli Churn RN, CCM, [place] Management Coordinator [facility] Care Management [phone]

## 2019-02-06 ENCOUNTER — Other Ambulatory Visit: Payer: Self-pay | Admitting: *Deleted

## 2019-02-06 NOTE — Patient Outreach (Addendum)
[place] [facility]) Care Management Chronic Special Needs Program  02/06/2019  Name: [name] [name] DOB: [birth year]  MRN: [id number]  [name] [name] is enrolled in a chronic special needs plan for Diabetes. Reviewed and updated care plan. Successful 1 week follow up outreach to assess client's competence and confidence in using the Freestyle Libre 14 day flash glucose monitoring system.   Subjective: [name] [name] says he watched the instructional video this RNCM sent him last week and he is now using the [facility] and really likes it. He says he wants to use his iPhone 10 as the reader but has been unsuccessful getting the app to work properly. He denies any episode of hypoglycemia. He says he has an appointment for diabetes self management education on 2/22. He says he has been referred to a gastroenterologist for a colonocopy but doesn't know the date of the first one as it must have been at least 10 years ago in order for his insurance to cover it.   He reports he will receive his first Covid 19 vaccination on 02/08/19.   Goals Addressed            This Visit's Progress   . " I need to know the date of my last colonoscopy because it has to have been at least 10 years ago or my insurance won't pay for another one"       Reviewed Care Everywhere and advised client he had his colonoscopy in 2010; advised him to call Dr Fayrene Fearing (previous primary care provider) office to get the exact date.     . Client will verbalize knowledge of diabetes self-management as evidenced by Hgb A1C <7 or as defined by provider.       Most recent Hgb A1C = 15.1% on 01/06/19 Discussed the following diabetes self management actions:  Eat Healthy  Check feet daily  Visit provider every 3-6 months as directed  Hbg A1C level every 3-6 months.-Hgb A1C= 15.1 on 01/06/19  Eye Exam yearly Secured referral from primary care provider for diabetes self management education and support- his  appointment is 02/23/19 at 11:00 am Assessed client's competence and confidence in using the [facility] 14 day flash glucose monitoring system after watching the instructional video e-mailed to him on 01/29/19 Provided him with [employer] phone number so he can successfully download the [place] 14 Day app to his iPhone 10 so that he is able to use his smart phone as his reader     . HEMOGLOBIN A1C < 7.0       Discussed the following diabetes self management actions:  Eat Healthy  Check feet daily  Visit provider every 3-6 months as directed  Hbg A1C level every 3-6 months.-Hgb A1C= 15.1 on 01/06/19  Eye Exam yearly Secured referral from primary care provider for diabetes self management education and support- his appointment is 02/23/19 at 11:00 am Assessed client's competence and confidence in using the [facility] 14 day flash glucose monitoring system after watching the instructional video e-mailed to him on 01/29/19 Provided him with [employer] phone number so he can successfully download the [place] 14 Day app to his iPhone 10 so that he is able to use his smart phone as his reader    . Obtain Annual Eye (retinal)  Exam    On track    Diabetic eye exam was completed on 03/10/18- client states he was referred to retinal specialist due to diabetic retinopathy but he  was never able to follow up due to Covid pandemic    . Obtain Annual Foot Exam   On track    Diabetic foot exam was completed on 02/17/18    . Visit Primary Care Provider or Endocrinologist at least 2 times per year        Client has completed appointments with primary care provider on 01/06/19 and 11/12/18       Assessment: Client using Freestyle Libre to assist with diabetes self management and will receive in person education later this month.  Plan:   Chronic care management coordinator will outreach in:  3 Months   Kelli Churn RN, CCM, [place] Management Coordinator [place] Management [phone]      .

## 2019-02-08 ENCOUNTER — Ambulatory Visit: Payer: HMO | Attending: Internal Medicine

## 2019-02-08 DIAGNOSIS — Z23 Encounter for immunization: Secondary | ICD-10-CM | POA: Insufficient documentation

## 2019-02-08 NOTE — Progress Notes (Signed)
   Covid-19 Vaccination Clinic  Name:  [name] [name]    MRN: [id number] DOB: 01/21/1955  02/08/2019  [name] was observed post Covid-19 immunization for 15 minutes without incidence. He was provided with Vaccine Information Sheet and instruction to access the V-Safe system.   [name] was instructed to call 911 with any severe reactions post vaccine: Marland Kitchen Difficulty breathing  . Swelling of your face and throat  . A fast heartbeat  . A bad rash all over your body  . Dizziness and weakness    Immunizations Administered    Name Date Dose VIS Date Route   Pfizer COVID-19 Vaccine 02/08/2019  3:50 PM 0.3 mL 12/12/2018 Intramuscular   Manufacturer: [place]   Lot: [id number]   [place]: [id number]

## 2019-02-10 ENCOUNTER — Encounter: Payer: Self-pay | Admitting: Gastroenterology

## 2019-02-10 ENCOUNTER — Ambulatory Visit: Payer: Self-pay | Admitting: Pharmacist

## 2019-02-11 ENCOUNTER — Other Ambulatory Visit: Payer: Self-pay | Admitting: *Deleted

## 2019-02-11 NOTE — Patient Outreach (Addendum)
  [place] [facility]) Care Management Chronic Special Needs Program   02/11/2019  Name: Anay Loporto, DOB: Apr 26, 1955  MRN: [id number]  The client was discussed in today's interdisciplinary care team meeting.  The following issues were discussed:  Client's needs, Changes in health status, Key risk triggers/risk stratification, Care Plan, Coordination of care and Issues/barriers to care  Participants present:  Thea Silversmith, MSN, RN, CCM  Melissa Sandlin RN,BSN,CCM, CDCES  Kelli Churn, RN, CCM, CDCES Quinn Plowman RN, BSN, CCM  Maryella Shivers, MD  Bary Castilla, RN, BSN, MS, CCM Coralie Carpen, MD [place] Team: Babs Bertin, RN; Teresa Pelton, RN, BSN  Recommendations: Consider referral to endocrinologist, and adding SGLT2 inhibitor to diabetes treatment regimen.   Plan: In basket message sent to primary care provider regarding interdisciplinary care team meeting recommendations.  Follow-up: RNCM will follow up with client per [place] tier level.  Kelli Churn RN, CCM, [place] Management Coordinator [facility] Care Management [phone]

## 2019-02-18 ENCOUNTER — Telehealth: Payer: Self-pay | Admitting: *Deleted

## 2019-02-18 ENCOUNTER — Other Ambulatory Visit: Payer: Self-pay | Admitting: *Deleted

## 2019-02-18 NOTE — Patient Outreach (Signed)
[place] [facility]) Care Management  02/18/2019  Temitope Fekete 04/07/1955 [id number]   CSW received call back from patient. CSW reviewed referral and completed depression screening (scored 2 on PHQ2 & 3 on PHQ9). Patient declined counseling at this time as he states that he "currently has too many doctors appointments to keep up with" but "might would be open to it later down the road." CSW provided supportive listening as patient spoke about the pain in his knee and how he has started to take Mirtazepine 30mg  at night to help him sleep and found that to be very helpful. CSW informed patient that not only does it help with sleep but it is also an antidepressant. Patient states that may be why he has been feeling better lately. Patient states that he is also taking Paxil 20mg  daily and states that he plans to ask Dr. Jerilee Hoh (PCP) to see if she would be able to call in a refill prescription for the Mirtazepine/Remeron to his pharmacy ([facility] on [address]). CSW  close case as patient has declined need for follow-up and states that he will reach back out if he decides to pursue counseling.    Raynaldo Opitz, LCSW [facility]  Clinical Social Worker cell #: [phone]

## 2019-02-18 NOTE — Patient Outreach (Signed)
[place] [facility]) Care Management  02/18/2019  Alvi [place] 1955-03-17 [id number]   CSW made an initial attempt to try and contact patient today to perform phone assessment, as well as assess and assist with social needs and services, without success. A HIPPA compliant message was left for patient on voicemail (ph#: [phone]). CSW is currently awaiting a return call & will make a second outreach attempt within the next 3-4 days, if CSW does not receive a return call from patient in the meantime.    Raynaldo Opitz, LCSW [facility]  Clinical Social Worker cell #: [phone]

## 2019-02-23 ENCOUNTER — Ambulatory Visit: Payer: Self-pay | Admitting: *Deleted

## 2019-02-23 ENCOUNTER — Ambulatory Visit: Payer: HMO | Admitting: Registered"

## 2019-02-24 ENCOUNTER — Ambulatory Visit: Payer: HMO

## 2019-02-24 ENCOUNTER — Telehealth: Payer: Self-pay | Admitting: Internal Medicine

## 2019-02-24 MED ORDER — MIRTAZAPINE 30 MG PO TBDP: 30.0000 mg | ORAL_TABLET | Freq: Every day | ORAL | 3 refills | Status: DC

## 2019-02-24 NOTE — Telephone Encounter (Signed)
Historical medication  Last filled by Elayne Guerin, [facility] Last office visit with Dr Jerilee Hoh 01-14-19  Okay to fill?

## 2019-02-24 NOTE — Telephone Encounter (Signed)
pt  requesting a refill  mirtazapine (REMERON SOL-TAB) 30 MG disintegrating tablet [facility] [address] [place], [place]  Phone: [phone] Fax: [phone] pt contact number  [phone]

## 2019-02-25 MED ORDER — MIRTAZAPINE 30 MG PO TBDP: 30.0000 mg | ORAL_TABLET | Freq: Every day | ORAL | 1 refills | Status: DC

## 2019-02-25 NOTE — Telephone Encounter (Signed)
Spoke with patient and refill sent.  Patient is aware that he will need an appointment for further refills.  Patient will call back to schedule.

## 2019-03-02 DIAGNOSIS — D126 Benign neoplasm of colon, unspecified: Secondary | ICD-10-CM

## 2019-03-02 HISTORY — DX: Benign neoplasm of colon, unspecified: D12.6

## 2019-03-04 ENCOUNTER — Ambulatory Visit: Payer: HMO | Attending: Internal Medicine

## 2019-03-04 ENCOUNTER — Ambulatory Visit: Payer: HMO

## 2019-03-04 DIAGNOSIS — Z23 Encounter for immunization: Secondary | ICD-10-CM

## 2019-03-04 NOTE — Progress Notes (Signed)
   Covid-19 Vaccination Clinic  Name:  [name] [name]    MRN: [id number] DOB: 23-Feb-1955  03/04/2019  [name] was observed post Covid-19 immunization for 15 minutes without incident. He was provided with Vaccine Information Sheet and instruction to access the V-Safe system.   [name] was instructed to call 911 with any severe reactions post vaccine: Marland Kitchen Difficulty breathing  . Swelling of face and throat  . A fast heartbeat  . A bad rash all over body  . Dizziness and weakness   Immunizations Administered    Name Date Dose VIS Date Route   Pfizer COVID-19 Vaccine 03/04/2019  3:15 PM 0.3 mL 12/12/2018 Intramuscular   Manufacturer: [place]   Lot: [id number]   [place]: [id number]

## 2019-03-16 ENCOUNTER — Other Ambulatory Visit: Payer: Self-pay

## 2019-03-16 ENCOUNTER — Ambulatory Visit (AMBULATORY_SURGERY_CENTER): Payer: Self-pay | Admitting: *Deleted

## 2019-03-16 ENCOUNTER — Telehealth: Payer: Self-pay | Admitting: Internal Medicine

## 2019-03-16 VITALS — Temp 97.1°F | Ht 74.0 in | Wt 162.0 lb

## 2019-03-16 DIAGNOSIS — Z1211 Encounter for screening for malignant neoplasm of colon: Secondary | ICD-10-CM

## 2019-03-16 MED ORDER — PLENVU 140 G PO SOLR: 1.0000 | ORAL | 0 refills | Status: DC

## 2019-03-16 NOTE — Telephone Encounter (Signed)
Mary from house call stated that the pt's diabetes is not well controled, he has plus four glucose in his urine and blood sugar is high. Pt states he is depressed but his PHQ9 is 13. He has sucidal thoughts at times. She did put in a referral for behavioral health but that is more of a case worker.   [name] can be reached at [phone]  Pt can be reached at [phone]

## 2019-03-16 NOTE — Progress Notes (Signed)
Pt has completed COVID vaccines 03-02-2019 per pt- no pre screen covid test needed      No egg or soy allergy known to patient  No issues with past sedation with any surgeries  or procedures, no intubation problems  No diet pills per patient No home 02 use per patient  No blood thinners per patient  Pt states issues with constipation - he has a BM EOD and are hard- he eats 3 prunes a day - not helping - 2 day Plenvu  No A fib or A flutter  EMMI video sent to pt's e mail   Due to the COVID-19 pandemic we are asking patients to follow these guidelines. Please only bring one care partner. Please be aware that your care partner may wait in the car in the parking lot or if they feel like they will be too hot to wait in the car, they may wait in the lobby on the 4th floor. All care partners are required to wear a mask the entire time (we do not have any that we can provide them), they need to practice social distancing, and we will do a Covid check for all patient's and care partners when you arrive. Also we will check their temperature and your temperature. If the care partner waits in their car they need to stay in the parking lot the entire time and we will call them on their cell phone when the patient is ready for discharge so they can bring the car to the front of the building. Also all patient's will need to wear a mask into building.  Plenvu sample [id number] exp 04/2019 as pt has HTA

## 2019-03-17 NOTE — Telephone Encounter (Signed)
FYI  Spoke with patient and he explained that a medicare nurse came to his home.  He is having a lot of knee pain.  He does not have any plans to hurt or harm himself.  He "promised his son that he would not do that".  Information given to the patient about  Behavior health, [facility], and [facility].  Patient was interested in [facility] at  [address] [place], [place] [address].  Patient agreed to give them a call.  Patient wants to keep his appointment with Dr Jerilee Hoh, but will call back to cancel if instructed to do otherwise by behavioral health.

## 2019-03-18 ENCOUNTER — Other Ambulatory Visit: Payer: Self-pay

## 2019-03-18 ENCOUNTER — Encounter: Payer: Self-pay | Admitting: Registered"

## 2019-03-18 ENCOUNTER — Encounter: Payer: HMO | Attending: Internal Medicine | Admitting: Registered"

## 2019-03-18 DIAGNOSIS — E1165 Type 2 diabetes mellitus with hyperglycemia: Secondary | ICD-10-CM | POA: Diagnosis not present
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
## 2019-03-18 NOTE — Patient Instructions (Signed)
Emergen-C Electro Mix for sugar free hydration.  Add heavy whipping cream to berries.  Set an appointment with endocrinologist to assess insulin dosage.  Add some more calories with full fat dressing, butter, oils, full fat dairy.  Visit with your mental health professional  Look into SNAP food assistance program, and the food delivery service with the [facility] program.  Keep up the great work! You are already well on track!

## 2019-03-19 ENCOUNTER — Other Ambulatory Visit: Payer: Self-pay

## 2019-03-19 NOTE — Patient Outreach (Signed)
  [facility]) Care Management Chronic Special Needs Program    03/19/2019  Name: [name], DOB: [birth year]  MRN: [id number]   [name] is enrolled in a chronic special needs plan for Diabetes. Telephone call to client regarding HTA referral. Unable to reach. HIPAA compliant voice message left with call back phone number and return call request.   PLAN: RNCM will attempt 2nd telephone call to patient within 3 business days.   Quinn Plowman RN,BSN,CCM [place] [facility] Management [phone]

## 2019-03-20 ENCOUNTER — Encounter: Payer: Self-pay | Admitting: Internal Medicine

## 2019-03-20 ENCOUNTER — Ambulatory Visit: Payer: HMO | Admitting: Internal Medicine

## 2019-03-20 ENCOUNTER — Ambulatory Visit (INDEPENDENT_AMBULATORY_CARE_PROVIDER_SITE_OTHER): Payer: HMO | Admitting: Internal Medicine

## 2019-03-20 VITALS — BP 130/80 | HR 74 | Temp 98.1°F | Wt 165.1 lb

## 2019-03-20 DIAGNOSIS — M7651 Patellar tendinitis, right knee: Secondary | ICD-10-CM | POA: Diagnosis not present

## 2019-03-20 DIAGNOSIS — E1165 Type 2 diabetes mellitus with hyperglycemia: Secondary | ICD-10-CM

## 2019-03-20 NOTE — Progress Notes (Signed)
Established Patient Office Visit     This visit occurred during the SARS-CoV-2 public health emergency.  Safety protocols were in place, including screening questions prior to the visit, additional usage of staff PPE, and extensive cleaning of exam room while observing appropriate contact time as indicated for disinfecting solutions.    CC/Reason for Visit: Continued right knee pain  HPI: Yandriel Behm is a 64 y.o. male who is coming in today for the above mentioned reasons.  He continues to have pain of his right knee.  He had an MRI in May 2020 that showed degenerative changes to the posterior horn of the lateral meniscus without visible tear, mild quadriceps tendinopathy with recurrent impingement of intra-articular fat, tricompartmental chondromalacia.  He says he visited an orthopedist in [place] who did a cortisone shot that did provide some relief but were short lasting, he also recommended "gel injections" to the knee but his insurance did not cover that.  He states this is significantly impacts his quality of life.  We did send an orthopedist referral for him back in December that he apparently voluntarily canceled.  He is not taking short and long-acting insulin for his diabetes.  He saw a nutritionist and got some good information.  He is concerned as he is not gaining weight.  He states his sugars have mostly been in the low 200s.  Past Medical/Surgical History: Past Medical History:  Diagnosis Date  . Anxiety   . Aortic dissection ([place])   . Arthritis   . Constipation    pt states goes EOD- hard stools   . Coronary artery disease   . Depression   . Diabetes mellitus without complication ([place])   . DKA (diabetic ketoacidoses) ([place])   . Hyperlipidemia   . Hypertension    off meds  . Psoriasis     Past Surgical History:  Procedure Laterality Date  . ANKLE SURGERY     right  . CARDIAC CATHETERIZATION  09/25/2001  . COLONOSCOPY     ~10 yr ago- normal  per  pt   . CORONARY ARTERY BYPASS GRAFT  09/30/2001   CABG x 4  . LAPAROSCOPIC INGUINAL HERNIA REPAIR Bilateral 02/09/2010  . MASS EXCISION Left 03/12/2013   Procedure: LEFT INDEX EXCISION MASS AND DEBRIDMENT DISTAL INTERPHALANGEAL JOINT;  Surgeon: Tennis Must, MD;  Location: [facility];  Service: Orthopedics;  Laterality: Left;    Social History:  reports that he has been smoking cigars. He has never used smokeless tobacco. He reports current alcohol use. He reports that he does not use drugs.  Allergies: No Known Allergies  Family History:  Family History  Problem Relation Age of Onset  . Lymphoma Father   . Cancer Paternal Grandmother        Colon Cancer  . Colon cancer Paternal Grandmother   . Colon polyps Neg Hx   . Esophageal cancer Neg Hx   . Rectal cancer Neg Hx   . Stomach cancer Neg Hx      Current Outpatient Medications:  .  Ascorbic Acid (VITAMIN C) 100 MG tablet, Take 100 mg by mouth daily., Disp: , Rfl:  .  aspirin 325 MG EC tablet, Take 325 mg by mouth daily., Disp: , Rfl:  .  atorvastatin (LIPITOR) 40 MG tablet, Take 40 mg by mouth daily., Disp: , Rfl:  .  B Complex Vitamins (B COMPLEX-B12 PO), Take by mouth daily., Disp: , Rfl:  .  cholecalciferol (VITAMIN D3) 25  MCG (1000 UT) tablet, Take 1,000 Units by mouth daily., Disp: , Rfl:  .  Continuous Blood Gluc Receiver (FREESTYLE LIBRE 14 DAY READER) DEVI, 1 each by Does not apply route daily., Disp: 1 each, Rfl: 0 .  Continuous Blood Gluc Sensor (FREESTYLE LIBRE 14 DAY SENSOR) MISC, 1 each by Does not apply route daily., Disp: 2 each, Rfl: 5 .  ELDERBERRY PO, Take 1,250 mg by mouth daily., Disp: , Rfl:  .  gabapentin (NEURONTIN) 300 MG capsule, Take 1 capsule (300 mg total) by mouth at bedtime., Disp: 90 capsule, Rfl: 1 .  insulin aspart (NOVOLOG FLEXPEN) 100 UNIT/ML FlexPen, Inject 16 Units into the skin 3 (three) times daily with meals. (Patient taking differently: Inject 10 Units into the skin 3 (three)  times daily with meals. With sliding scale: CBG<200 no additional novolog CBG 201-250 4 units CBG 251-300 6 units CBG 300-350 8 units CBG 151-400 10 units), Disp: 15 mL, Rfl: 3 .  insulin glargine (LANTUS) 100 UNIT/ML injection, Inject 12 Units into the skin daily. Pt uses at bedtime, Disp: , Rfl:  .  Insulin Pen Needle (DROPLET PEN NEEDLES) 32G X 4 MM MISC, Use daily for glucose control, Disp: 100 each, Rfl: 5 .  lisinopril (ZESTRIL) 5 MG tablet, Take 1 tablet (5 mg total) by mouth daily., Disp: 90 tablet, Rfl: 1 .  metFORMIN (GLUCOPHAGE) 1000 MG tablet, Take 1,000 mg by mouth 2 (two) times daily with a meal., Disp: , Rfl:  .  mirtazapine (REMERON SOL-TAB) 30 MG disintegrating tablet, Take 1 tablet (30 mg total) by mouth at bedtime., Disp: 30 tablet, Rfl: 1 .  Multiple Vitamin (MULTIVITAMIN ADULT PO), Take by mouth daily., Disp: , Rfl:  .  Omega 3-6-9 Fatty Acids (OMEGA 3-6-9 PO), Take by mouth daily., Disp: , Rfl:  .  PARoxetine (PAXIL) 20 MG tablet, Take 20 mg by mouth daily., Disp: , Rfl:   Review of Systems:  Constitutional: Denies fever, chills, diaphoresis, appetite change and fatigue.  HEENT: Denies photophobia, eye pain, redness, hearing loss, ear pain, congestion, sore throat, rhinorrhea, sneezing, mouth sores, trouble swallowing, neck pain, neck stiffness and tinnitus.   Respiratory: Denies SOB, DOE, cough, chest tightness,  and wheezing.   Cardiovascular: Denies chest pain, palpitations and leg swelling.  Gastrointestinal: Denies nausea, vomiting, abdominal pain, diarrhea, constipation, blood in stool and abdominal distention.  Genitourinary: Denies dysuria, urgency, frequency, hematuria, flank pain and difficulty urinating.  Endocrine: Denies: hot or cold intolerance, sweats, changes in hair or nails, polyuria, polydipsia. Musculoskeletal: Denies myalgias, back pain. Skin: Denies pallor, rash and wound.  Neurological: Denies dizziness, seizures, syncope, weakness, light-headedness,  numbness and headaches.  Hematological: Denies adenopathy. Easy bruising, personal or family bleeding history  Psychiatric/Behavioral: Denies suicidal ideation, mood changes, confusion, nervousness, sleep disturbance and agitation    Physical Exam: Vitals:   03/20/19 1346  BP: 130/80  Pulse: 74  Temp: 98.1 F (36.7 C)  TempSrc: Temporal  SpO2: 96%  Weight: 165 lb 1.6 oz (74.9 kg)    Body mass index is 21.2 kg/m.   Constitutional: NAD, calm, comfortable Eyes: PERRL, lids and conjunctivae normal ENMT: Mucous membranes are moist.  Respiratory: clear to auscultation bilaterally, no wheezing, no crackles. Normal respiratory effort. No accessory muscle use.  Cardiovascular: Regular rate and rhythm, no murmurs / rubs / gallops. No extremity edema.  Impression and Plan:  Uncontrolled type 2 diabetes mellitus with hyperglycemia ([place])  - Plan: Ambulatory referral to Endocrinology -A1c in January was 15.1. -He was  started on short and long-acting insulin. -He prefers to be managed by endocrinologist, referral placed today.  Patellar tendonitis of right knee  - Plan: Ambulatory referral to Orthopedic Surgery for second opinion. -NSAIDs and icing advised in the interim.    Patient Instructions  -Nice seeing you today!!  -Referrals to orthopedics and endocrinology have been sent today.  -Schedule a 3 month follow up.     Lelon Frohlich, MD [facility]

## 2019-03-20 NOTE — Patient Instructions (Signed)
-  Nice seeing you today!!  -Referrals to orthopedics and endocrinology have been sent today.  -Schedule a 3 month follow up.

## 2019-03-21 ENCOUNTER — Other Ambulatory Visit: Payer: Self-pay | Admitting: Internal Medicine

## 2019-03-21 DIAGNOSIS — E559 Vitamin D deficiency, unspecified: Secondary | ICD-10-CM

## 2019-03-23 ENCOUNTER — Other Ambulatory Visit: Payer: Self-pay | Admitting: *Deleted

## 2019-03-23 ENCOUNTER — Other Ambulatory Visit: Payer: Self-pay

## 2019-03-23 NOTE — Patient Outreach (Signed)
[facility]) Care Management  03/23/2019  [name] [name] 05-11-1955 [id number]   CSW had received a referral from [place], [place] for community resources (lack of adequate food and safe drinking water, unable to pay for utilities, and unable to pay for prescriptions). CSW made an initial attempt to try and contact patient today to perform phone assessment, as well as assess and assist with social needs and services, without success. A HIPPA compliant message was left for patient on voicemail (ph#: [phone]). CSW is currently awaiting a return call. CSW will have CMA mail unsuccessful outreach letter & make a second outreach attempt within the next 3-4 days, if CSW does not receive a return call from patient in the meantime.    Raynaldo Opitz, LCSW [facility]  Clinical Social Worker cell #: [phone]

## 2019-03-23 NOTE — Patient Outreach (Signed)
  [facility]) Care Management Chronic Special Needs Program    03/23/2019  Name: [name] [name], DOB: [birth year]  MRN: [id number]   [name] is enrolled in a chronic special needs plan for Diabetes.  Telephone call to client regarding HTA referral. Unable to reach. HIPAA compliant voice message left with call back phone number and return call request.   PLAN: RNCM will attempt 2nd telephone call to patient within 3 business days.   Quinn Plowman RN,BSN,CCM [facility] Care Management [phone]

## 2019-03-25 ENCOUNTER — Encounter: Payer: Self-pay | Admitting: Gastroenterology

## 2019-03-26 ENCOUNTER — Ambulatory Visit (INDEPENDENT_AMBULATORY_CARE_PROVIDER_SITE_OTHER): Payer: HMO | Admitting: Orthopaedic Surgery

## 2019-03-26 ENCOUNTER — Ambulatory Visit (INDEPENDENT_AMBULATORY_CARE_PROVIDER_SITE_OTHER): Payer: HMO

## 2019-03-26 ENCOUNTER — Telehealth: Payer: Self-pay

## 2019-03-26 ENCOUNTER — Other Ambulatory Visit: Payer: Self-pay

## 2019-03-26 ENCOUNTER — Encounter: Payer: Self-pay | Admitting: Orthopaedic Surgery

## 2019-03-26 ENCOUNTER — Ambulatory Visit: Payer: Self-pay

## 2019-03-26 VITALS — Ht 74.0 in | Wt 156.0 lb

## 2019-03-26 DIAGNOSIS — M25561 Pain in right knee: Secondary | ICD-10-CM

## 2019-03-26 DIAGNOSIS — G8929 Other chronic pain: Secondary | ICD-10-CM | POA: Diagnosis not present

## 2019-03-26 NOTE — Telephone Encounter (Signed)
Submitted for VOB for Monovisc-Right knee

## 2019-03-26 NOTE — Telephone Encounter (Signed)
Approved for Monovisc-Right knee Dr. Frederik Pear and Bill 20% copay/coinsurance-Once OOP has been met it is covered @ 100% No prior auth required

## 2019-03-26 NOTE — Telephone Encounter (Signed)
Pt called back and has an appt on 4/6

## 2019-03-26 NOTE — Progress Notes (Addendum)
Office Visit Note   Patient: [name] [name]           Date of Birth: 1955/03/09           MRN: [id number] Visit Date: 03/26/2019              Requested by: [name] [name], [name] Halsted, MD [place],  [place] [address] PCP: [name] [name], [name] Halsted, MD   Assessment & Plan: Visit Diagnoses:  1. Chronic pain of right knee     Plan: Impression is chronic right knee pain.  He had an MRI last year and tried imaging which showed chondromalacia and distal quadriceps tendinopathy.  I believe that the same processes are still present.  He does not have an effusion.  He did not have any prolonged relief from prior cortisone injection.  Physical therapy has not given him much relief.  We will submit his insurance for viscosupplementation injection.  I recommend over-the-counter knee brace as well as Voltaren gel to try.  We will see him back to administer Synvisc injection. This patient is diagnosed with osteoarthritis of the knee(s).    Radiographs show evidence of joint space narrowing, osteophytes, subchondral sclerosis and/or subchondral cysts.  This patient has knee pain which interferes with functional and activities of daily living.    This patient has experienced inadequate response, adverse effects and/or intolerance with conservative treatments such as acetaminophen, NSAIDS, topical creams, physical therapy or regular exercise, knee bracing and/or weight loss.   This patient has experienced inadequate response or has a contraindication to intra articular steroid injections for at least 3 months.   This patient is not scheduled to have a total knee replacement within 6 months of starting treatment with viscosupplementation.  Follow-Up Instructions: Return if symptoms worsen or fail to improve.   Orders:  Orders Placed This Encounter  Procedures  . XR KNEE 3 VIEW RIGHT   No orders of the defined types were placed in this encounter.     Procedures: No  procedures performed   Clinical Data: No additional findings.   Subjective: Chief Complaint  Patient presents with  . Right Knee - Pain    [name] [name] is a pleasant 64 year old retired [employer] research officer, government who comes in for evaluation of chronic right knee pain for 1 year.  He denies any injuries around the time.  He has a constant 8 out of 10 pain that is worse with walking.  He denies any mechanical symptoms.  He is not taking any pain medications.  Denies any numbness or tingling.  He is a diabetic with his most recent A1c of greater than 15.  Denies any instability.  He received a cortisone injection last year which helped for about a day.   Review of Systems  Constitutional: Negative.   All other systems reviewed and are negative.    Objective: Vital Signs: Ht 6\' 2"  (1.88 m)   Wt 156 lb (70.8 kg)   BMI 20.03 kg/m   Physical Exam Vitals and nursing note reviewed.  Constitutional:      Appearance: He is well-developed.  HENT:     Head: Normocephalic and atraumatic.  Eyes:     Pupils: Pupils are equal, round, and reactive to light.  Pulmonary:     Effort: Pulmonary effort is normal.  Abdominal:     Palpations: Abdomen is soft.  Musculoskeletal:        General: Normal range of motion.     Cervical back: Neck supple.  Skin:    General: Skin is warm.  Neurological:     Mental Status: He is alert and oriented to person, place, and time.  Psychiatric:        Behavior: Behavior normal.        Thought Content: Thought content normal.        Judgment: Judgment normal.     Ortho Exam Right knee shows no joint effusion.  Full range of motion without any apparent pain.  Collaterals and cruciates are stable.  No patellofemoral crepitus. Specialty Comments:  No specialty comments available.  Imaging: XR KNEE 3 VIEW RIGHT  Result Date: 03/26/2019 Mild chondromalacia.  Small ossification in distal quadriceps tendon.    PMFS History: Patient Active Problem List    Diagnosis Date Noted  . Aortic dissection ([place]) 02/05/2019  . Coronary artery disease 02/05/2019  . Hyperlipidemia 02/05/2019  . Hypertension 02/05/2019  . Psoriasis 02/05/2019  . Vitamin D deficiency 01/07/2019  . Uncontrolled type 2 diabetes mellitus with hyperglycemia ([place]) 01/07/2019  . Microalbuminuria 01/07/2019  . Depression 06/10/2018  . DKA (diabetic ketoacidoses) ([place]) 06/10/2018  . Non-intractable vomiting 06/10/2018  . Chronic pain of right knee 05/15/2018  . Patellar tendonitis of right knee 04/24/2018  . Ulnar neuropathy of left upper extremity 07/12/2017  . Pain in finger of left hand 07/12/2017  . Vertigo 03/07/2016  . Peyronie's disease 06/09/2015  . Erectile dysfunction 03/15/2015  . Right groin pain 01/20/2013  . S/P aortic aneurysm repair 05/19/2012  . S/P CABG (coronary artery bypass graft) 05/19/2012  . Non-proliferative diabetic retinopathy ([place]) 02/27/2011  . Contracture of hand joint 11/01/2010  . Dupuytren's contracture of both hands 11/01/2010   Past Medical History:  Diagnosis Date  . Anxiety   . Aortic dissection ([place])   . Arthritis   . Constipation    pt states goes EOD- hard stools   . Coronary artery disease   . Depression   . Diabetes mellitus without complication ([place])   . DKA (diabetic ketoacidoses) ([place])   . Hyperlipidemia   . Hypertension    off meds  . Psoriasis     Family History  Problem Relation Age of Onset  . Lymphoma Father   . Cancer Paternal Grandmother        Colon Cancer  . Colon cancer Paternal Grandmother   . Colon polyps Neg Hx   . Esophageal cancer Neg Hx   . Rectal cancer Neg Hx   . Stomach cancer Neg Hx     Past Surgical History:  Procedure Laterality Date  . ANKLE SURGERY     right  . CARDIAC CATHETERIZATION  09/25/2001  . COLONOSCOPY     ~10 yr ago- normal  per pt   . CORONARY ARTERY BYPASS GRAFT  09/30/2001   CABG x 4  . LAPAROSCOPIC INGUINAL HERNIA REPAIR Bilateral 02/09/2010  . MASS EXCISION Left  03/12/2013   Procedure: LEFT INDEX EXCISION MASS AND DEBRIDMENT DISTAL INTERPHALANGEAL JOINT;  Surgeon: Tennis Must, MD;  Location: [facility];  Service: Orthopedics;  Laterality: Left;   Social History   Occupational History  . Not on file  Tobacco Use  . Smoking status: Current Some Day Smoker    Types: Cigars  . Smokeless tobacco: Never Used  Substance and Sexual Activity  . Alcohol use: Yes    Comment: daily beer or scotch   . Drug use: No  . Sexual activity: Not on file

## 2019-03-26 NOTE — Telephone Encounter (Signed)
LVM for pt to call back.

## 2019-03-26 NOTE — Telephone Encounter (Signed)
Please submit for right knee synvisc injection. Dr. Erlinda Hong.

## 2019-03-26 NOTE — Telephone Encounter (Signed)
New start, ok to schedule at next available

## 2019-03-27 ENCOUNTER — Ambulatory Visit: Payer: Self-pay | Admitting: *Deleted

## 2019-03-30 ENCOUNTER — Other Ambulatory Visit: Payer: Self-pay

## 2019-03-30 ENCOUNTER — Ambulatory Visit (AMBULATORY_SURGERY_CENTER): Payer: HMO | Admitting: Gastroenterology

## 2019-03-30 ENCOUNTER — Encounter: Payer: Self-pay | Admitting: Gastroenterology

## 2019-03-30 VITALS — BP 99/65 | HR 63 | Temp 96.0°F | Resp 12 | Ht 74.0 in | Wt 162.0 lb

## 2019-03-30 DIAGNOSIS — D122 Benign neoplasm of ascending colon: Secondary | ICD-10-CM | POA: Diagnosis not present

## 2019-03-30 DIAGNOSIS — D124 Benign neoplasm of descending colon: Secondary | ICD-10-CM | POA: Diagnosis not present

## 2019-03-30 DIAGNOSIS — Z800 Family history of malignant neoplasm of digestive organs: Secondary | ICD-10-CM | POA: Diagnosis not present

## 2019-03-30 DIAGNOSIS — D123 Benign neoplasm of transverse colon: Secondary | ICD-10-CM

## 2019-03-30 DIAGNOSIS — Z1211 Encounter for screening for malignant neoplasm of colon: Secondary | ICD-10-CM | POA: Diagnosis not present

## 2019-03-30 DIAGNOSIS — D125 Benign neoplasm of sigmoid colon: Secondary | ICD-10-CM | POA: Diagnosis not present

## 2019-03-30 MED ORDER — INSULIN REGULAR HUMAN 100 UNIT/ML IJ SOLN: 5.0000 [IU] | Freq: Once | INTRAMUSCULAR | 0 refills | Status: DC

## 2019-03-30 MED ORDER — SODIUM CHLORIDE 0.9 % IV SOLN: 500.0000 mL | INTRAVENOUS | Status: DC

## 2019-03-30 MED ORDER — INSULIN REGULAR HUMAN 100 UNIT/ML IJ SOLN
5.0000 [IU] | Freq: Once | INTRAMUSCULAR | Status: AC
  Administered 2019-03-30: 5 [IU] via SUBCUTANEOUS

## 2019-03-30 NOTE — Progress Notes (Signed)
Called to room to assist during endoscopic procedure.  Patient ID and intended procedure confirmed with present staff. Received instructions for my participation in the procedure from the performing physician.  

## 2019-03-30 NOTE — Patient Instructions (Addendum)
No NSAIDS (Non-Steroidal anti-inflammatory drugs) for 3 weeks.  (These include, aspirin, aspirin-containing products, ibuprofen, advil, motrin, naproxen, aleve, goody powders, etc), Tylenol is ok to use for mild pain or fevers.  Handouts given for polyps and hemorrhoids.  YOU HAD AN ENDOSCOPIC PROCEDURE TODAY AT [facility]:   Refer to the procedure report that was given to you for any specific questions about what was found during the examination.  If the procedure report does not answer your questions, please call your gastroenterologist to clarify.  If you requested that your care partner not be given the details of your procedure findings, then the procedure report has been included in a sealed envelope for you to review at your convenience later.  YOU SHOULD EXPECT: Some feelings of bloating in the abdomen. Passage of more gas than usual.  Walking can help get rid of the air that was put into your GI tract during the procedure and reduce the bloating. If you had a lower endoscopy (such as a colonoscopy or flexible sigmoidoscopy) you may notice spotting of blood in your stool or on the toilet paper. If you underwent a bowel prep for your procedure, you may not have a normal bowel movement for a few days.  Please Note:  You might notice some irritation and congestion in your nose or some drainage.  This is from the oxygen used during your procedure.  There is no need for concern and it should clear up in a day or so.  SYMPTOMS TO REPORT IMMEDIATELY:   Following lower endoscopy (colonoscopy or flexible sigmoidoscopy):  Excessive amounts of blood in the stool  Significant tenderness or worsening of abdominal pains  Swelling of the abdomen that is new, acute  Fever of 100F or higher  For urgent or emergent issues, a gastroenterologist can be reached at any hour by calling [phone]. Do not use MyChart messaging for urgent concerns.   DIET:  We do recommend a small meal at  first, but then you may proceed to your regular diet.  Drink plenty of fluids but you should avoid alcoholic beverages for 24 hours.  ACTIVITY:  You should plan to take it easy for the rest of today and you should NOT DRIVE or use heavy machinery until tomorrow (because of the sedation medicines used during the test).    FOLLOW UP: Our staff will call the number listed on your records 48-72 hours following your procedure to check on you and address any questions or concerns that you may have regarding the information given to you following your procedure. If we do not reach you, we will leave a message.  We will attempt to reach you two times.  During this call, we will ask if you have developed any symptoms of COVID 19. If you develop any symptoms (ie: fever, flu-like symptoms, shortness of breath, cough etc.) before then, please call [phone].  If you test positive for Covid 19 in the 2 weeks post procedure, please call and report this information to [place].    If any biopsies were taken you will be contacted by phone or by letter within the next 1-3 weeks.  Please call us at [phone] if you have not heard about the biopsies in 3 weeks.    SIGNATURES/CONFIDENTIALITY: You and/or your care partner have signed paperwork which will be entered into your electronic medical record.  These signatures attest to the fact that that the information above on your After Visit Summary has been  reviewed and is understood.  Full responsibility of the confidentiality of this discharge information lies with you and/or your care-partner. 

## 2019-03-30 NOTE — Progress Notes (Signed)
To PACU, VSS. Report to Rn.tb 

## 2019-03-30 NOTE — Progress Notes (Signed)
5units of novolog given IV for [employer].tb

## 2019-03-30 NOTE — Op Note (Signed)
[facility] Patient Name: [name] [name] Procedure Date: 03/30/2019 9:03 AM MRN: [id number] Endoscopist: Ladene Artist , MD Age: 64 Referring MD:  Date of Birth: Apr 26, 1955 Gender: Male Account #: [id number] Procedure:                Colonoscopy Indications:              Screening for colorectal malignant neoplasm Medicines:                Monitored Anesthesia Care Procedure:                Pre-Anesthesia Assessment:                           - Prior to the procedure, a History and Physical                            was performed, and patient medications and                            allergies were reviewed. The patient's tolerance of                            previous anesthesia was also reviewed. The risks                            and benefits of the procedure and the sedation                            options and risks were discussed with the patient.                            All questions were answered, and informed consent                            was obtained. Prior Anticoagulants: The patient has                            taken no previous anticoagulant or antiplatelet                            agents. ASA Grade Assessment: II - A patient with                            mild systemic disease. After reviewing the risks                            and benefits, the patient was deemed in                            satisfactory condition to undergo the procedure.                           After obtaining informed consent, the colonoscope  was passed under direct vision. Throughout the                            procedure, the patient's blood pressure, pulse, and                            oxygen saturations were monitored continuously. The                            Colonoscope was introduced through the anus and                            advanced to the the cecum, identified by                            appendiceal orifice and  ileocecal valve. The                            ileocecal valve, appendiceal orifice, and rectum                            were photographed. The quality of the bowel                            preparation was good. The patient tolerated the                            procedure well. The colonoscopy was somewhat                            difficult due to limited endoscopic visualization                            due to diffuse, recurrent spasm Scope In: 9:09:13 AM Scope Out: 9:38:49 AM Scope Withdrawal Time: 0 hours 26 minutes 7 seconds  Total Procedure Duration: 0 hours 29 minutes 36 seconds  Findings:                 The perianal and digital rectal examinations were                            normal.                           Four sessile polyps were found in the descending                            colon (1) and transverse colon (3). The polyps were                            12 to 17 mm in size. These polyps were removed with                            a hot snare. Resection and retrieval were complete.  Four sessile polyps were found in the sigmoid colon                            (2), transverse colon (1) and ascending colon (1).                            The polyps were 7 to 8 mm in size. These polyps                            were removed with a cold snare. Resection and                            retrieval were complete.                           A 18 mm polyp was found in the sigmoid colon. The                            polyp was pedunculated. The polyp was removed with                            a hot snare. Resection and retrieval were complete.                           Internal hemorrhoids were found during                            retroflexion. The hemorrhoids were medium-sized and                            Grade I (internal hemorrhoids that do not prolapse).                           The exam was otherwise without abnormality on                             direct and retroflexion views. Complications:            No immediate complications. Estimated blood loss:                            None. Estimated Blood Loss:     Estimated blood loss: none. Impression:               - Four 12 to 17 mm polyps in the descending colon                            and in the transverse colon, removed with a hot                            snare. Resected and retrieved.                           - Four 7 to 8 mm  polyps in the sigmoid colon, in                            the transverse colon and in the ascending colon,                            removed with a cold snare. Resected and retrieved.                           - One 18 mm polyp in the sigmoid colon, removed                            with a hot snare. Resected and retrieved.                           - Internal hemorrhoids.                           - The examination was otherwise normal on direct                            and retroflexion views. Recommendation:           - Repeat colonoscopy, likely in 2 years, for                            surveillance based on pathology results.                           - Patient has a contact number available for                            emergencies. The signs and symptoms of potential                            delayed complications were discussed with the                            patient. Return to normal activities tomorrow.                            Written discharge instructions were provided to the                            patient.                           - Resume previous diet.                           - Continue present medications.                           - Await pathology results.                           -  No aspirin, ibuprofen, naproxen, or other                            non-steroidal anti-inflammatory drugs for 3 weeks                            after polyp removal. Ladene Artist, MD 03/30/2019 9:47:46 AM This  report has been signed electronically.

## 2019-03-30 NOTE — Progress Notes (Signed)
I have reviewed the patient's medical history in detail and updated the computerized patient record. Temp LC V/s CW

## 2019-04-01 ENCOUNTER — Telehealth: Payer: Self-pay | Admitting: *Deleted

## 2019-04-01 ENCOUNTER — Encounter: Payer: Self-pay | Admitting: Gastroenterology

## 2019-04-01 NOTE — Telephone Encounter (Signed)
Message left

## 2019-04-07 ENCOUNTER — Ambulatory Visit (INDEPENDENT_AMBULATORY_CARE_PROVIDER_SITE_OTHER): Payer: HMO | Admitting: Orthopaedic Surgery

## 2019-04-07 ENCOUNTER — Other Ambulatory Visit: Payer: Self-pay

## 2019-04-07 ENCOUNTER — Encounter: Payer: Self-pay | Admitting: Orthopaedic Surgery

## 2019-04-07 ENCOUNTER — Other Ambulatory Visit: Payer: Self-pay | Admitting: Pharmacist

## 2019-04-07 ENCOUNTER — Ambulatory Visit: Payer: Self-pay | Admitting: Pharmacist

## 2019-04-07 DIAGNOSIS — M1711 Unilateral primary osteoarthritis, right knee: Secondary | ICD-10-CM | POA: Diagnosis not present

## 2019-04-07 MED ORDER — LIDOCAINE HCL 1 % IJ SOLN
2.0000 mL | INTRAMUSCULAR | Status: AC | PRN
  Administered 2019-04-07: 2 mL

## 2019-04-07 MED ORDER — HYALURONAN 88 MG/4ML IX SOSY
88.0000 mg | PREFILLED_SYRINGE | INTRA_ARTICULAR | Status: AC | PRN
  Administered 2019-04-07: 88 mg via INTRA_ARTICULAR

## 2019-04-07 MED ORDER — BUPIVACAINE HCL 0.25 % IJ SOLN
2.0000 mL | INTRAMUSCULAR | Status: AC | PRN
  Administered 2019-04-07: 2 mL via INTRA_ARTICULAR

## 2019-04-07 NOTE — Progress Notes (Signed)
   Procedure Note  Patient: [name] [name]             Date of Birth: 1955-10-11           MRN: [id number]             Visit Date: 04/07/2019  Procedures: Visit Diagnoses:  1. Unilateral primary osteoarthritis, right knee     Large Joint Inj: R knee on 04/07/2019 2:38 PM Indications: pain Details: 22 G needle, anterolateral approach Medications: 2 mL lidocaine 1 %; 2 mL bupivacaine 0.25 %; 88 mg Hyaluronan 88 MG/4ML

## 2019-04-07 NOTE — Patient Outreach (Signed)
[facility])  [place]    [facility] pharmacy case will be closed as our team is transitioning from the [place] Management Department into the [facility] Quality Department and will no longer be using [facility] for documentation purposes.     Patient was called today for follow up. He did not answer the phone. HIPAA compliant message was left on his voicemail.  He did not have any medication needs. Today's call was just a follow up.    Case will be closed. Will gladly speak with the patient when he calls.  Elayne Guerin, PharmD, [place] Clinical Pharmacist [phone]

## 2019-04-08 ENCOUNTER — Telehealth: Payer: Self-pay | Admitting: Radiology

## 2019-04-08 ENCOUNTER — Other Ambulatory Visit: Payer: Self-pay

## 2019-04-08 ENCOUNTER — Other Ambulatory Visit: Payer: Self-pay | Admitting: Physician Assistant

## 2019-04-08 MED ORDER — TRAMADOL HCL 50 MG PO TABS: 50.0000 mg | ORAL_TABLET | Freq: Two times a day (BID) | ORAL | 0 refills | Status: DC | PRN

## 2019-04-08 NOTE — Telephone Encounter (Signed)
Sent in tramadol

## 2019-04-08 NOTE — Patient Outreach (Signed)
[facility]) Care Management Chronic Special Needs Program  04/08/2019  Name: Ezikiel Outerbridge DOB: December 23, 1955  MRN: [id number]  Mr. Shlomy Palacio is enrolled in a chronic special needs plan for Diabetes. Reviewed and updated care plan.  Subjective: Telephone call to client for assessment follow up. HIPAA verified. Client states, " my knee is hurting worse than ever."  States he is not taking any prescribed or over the counter pain medications.Client reports he is quite depressed often because of the ongoing knee pain he has. Client states, " it is effecting my quality of life." Provided client opportunity to discuss feelings and concerns and provided emotional support. Client denies having previous follow up with behavioral health.   Advised client to return call to assigned [facility] care management social worker, Raynaldo Opitz who can assist with depression counseling. Contact phone number provided to client.   Client reports he has recently started seeing an orthopedic doctor for his knee pain and had an injection done 2 days ago. He states he was not given anything for pain. RNCM advised client to call orthopedic office to discuss ongoing pain symptoms and option for pain relief.  Advised client to continue using ice and elevation as home management options.  Client verbalized understanding. Client states he continues to use the Free style libre to check his blood sugars. He states he did not check his blood sugar this morning.  He reports his fasting blood sugars normaly range from 220 - 230.  Client reports seeing a nutritionist recently. He states, "this was a waste of my time. The nutritionist had an intern with her which wasn't very helpful to me." Client states he wants to learn how to eat healthier regarding his diabetes and also gain some of his weight back.  He reports his current height and weight is 6 ft 2 in and 156 lbs.   RNCM discussed and offered referral to [employer] health coach for additional nutritional counseling and support. Client verbally agreed.Client reports he has difficulty at times obtaining food. He denies receiving any food resources. RNCM will request assigned social worker attempt call to client to assist with food resources. Contact phone number for social worker provided to client.  Client states he is not sleeping much. He reports the medication he takes, mirtazapine helps him fall asleep but not stay asleep. RNCM advised client to contact his provider regarding his ongoing sleep concerns.  Client reports he can afford his medications at this time. Advised client to contact this assigned RNCM if he has difficulty obtaining his medications.    Goals Addressed            This Visit's Progress   . " I can't sleep , I took a mirtazapine last night and it really helped, is that OK to keep doing?"   On track    Advised client to follow up with his doctor for ongoing sleep disturbance.     . COMPLETED: " I need to know the date of my last colonoscopy because it has to have been at least 10 years ago or my insurance won't pay for another one"       Client had colonoscopy on 03/30/19    . "I'm fearful of dying and depressed because of my poor health" (pt-stated)   On track    Patients concerns discussed.   Provided client contact information for assigned social worker Raynaldo Opitz, LCSW cell #: [phone].  Client advised to return call  from Education officer, museum.  Advised client to contact orthopedic specialist regarding ongoing knee pain.  Advised client to apply ice and use elevation for home management of knee pain.  RN case manager will send client education article:  Coping with a health condition: What you can do.      . Client understands the importance of follow-up with providers by attending scheduled visits   On track    Last primary care provider visit 03/20/19, 01/14/19 Continue to maintain and keep follow up visits with  your providers.     . Client will report improved coping with in the next 3 months   No change    Advised client to return call to [facility] social worker Claiborne Billings [employer] cell #: [phone] for follow up regarding depression symptoms/ concerns.  RN case manager will send client education article: Coping with a health condition: What you can do    . Client will report no worsening of symptoms related to heart disease within the next 3 months   On track    Continue to take your medications as prescribed.  Continue to follow up with your doctor as recommended.  Contact your doctor if you have questions or concerns.  RN case manager will send client education article: Heart disease in diabetics.     . Client will verbalize knowledge of diabetes self-management as evidenced by Hgb A1C <7 or as defined by provider.       Most recent Hgb  Client advised to continue to monitor his blood sugars daily utilizing his Free style libre device.  Reinforced diabetes self management actions:  Glucose monitoring per provider recommendation  Visit provider every 3-6 months as directed  Hbg A1C level every 3-6 months.  Carbohydrate controlled meal planning  Taking diabetes medication as prescribed by provider  Physical activity      . HEMOGLOBIN A1C < 7.0       Re-discussed the following diabetes self management actions:  Eat Healthy  Check feet daily  Visit provider every 3-6 months as directed  Hbg A1C level every 3-6 months.-Hgb A1C= 15.1 on 01/06/19  Eye Exam yearly Referred client to [employer] health coach for diabetic nutrition education and support.      . Maintain timely refills of diabetic medication as prescribed within the year .   On track    Review of medical record indicates client maintains timely refills of diabetic medications.  Continue to take medications as prescribed.  Contact your doctor if you have questions. Call your assigned RN case manager [phone]  If you have difficulty obtaining medications.     . Obtain annual  Lipid Profile, LDL-C   On track    Lipid Profile completed on 01/06/19, LDL= 53    . Obtain Annual Eye (retinal)  Exam    On track    Plan to schedule your eye exam yearly    . Obtain Annual Foot Exam   On track    Diabetic foot exam was completed on Diabetes foot care - Check feet daily at home (look for skin color changes, cuts, sores or cracks in the skin, swelling of feet or ankles, ingrown or fungal toenails, corn or calluses). Report these findings to your doctor - Wash feet with soap and water, dry feet well especially between toes - Moisturize your feet but not between the toes - Always wear shoes that protect your whole feet.      . Obtain annual screen for micro albuminuria (  urine) , nephropathy (kidney problems)   On track    Urine microalbumin was completed on 01/06/19, Continue to obtain yearly physicals/ follow up visits and lab checks as recommended by your doctor.      . Obtain Hemoglobin A1C at least 2 times per year       01/06/19  Hgb A1C= 15.1% Continue to to keep your follow up appointments with your provider and have lab work completed as recommended.     . Visit Primary Care Provider or Endocrinologist at least 2 times per year        Client has completed appointments with primary care provider on 01/06/19 03/20/19 Continue to see your primary care provider as recommended.        Plan:  Send successful outreach letter with a copy of their individualized care plan, Send individual care plan to provider and Send educational material  Chronic care management coordinator will outreach in:  3 Months  RNCM will refer client to [employer] health coach.    Quinn Plowman RN,BSN,CCM Chronic Care Management Coordinator [facility] [phone]    .

## 2019-04-08 NOTE — Telephone Encounter (Signed)
Patient called states that he is having a lot of pain since his gel injection yesterday.  He states that he would like something for the pain so he can eat and sleep. Uses [place]

## 2019-04-09 NOTE — Telephone Encounter (Signed)
Called patient no answer LMOM with details. Rx sent into pharm.

## 2019-04-10 ENCOUNTER — Telehealth: Payer: Self-pay | Admitting: Internal Medicine

## 2019-04-10 NOTE — Telephone Encounter (Signed)
[name] [name] from [facility] is requesting pt most recent A1C, last physical, and microalbumin.  Phone: [phone]

## 2019-04-17 ENCOUNTER — Telehealth: Payer: Self-pay | Admitting: *Deleted

## 2019-04-17 NOTE — Patient Outreach (Signed)
[place]) Care Management  04/17/2019  [name] [name] [birth year] [id number]   CSW made a second attempt reach patient to follow-up on referral made by [facility] RNCM, Davina for food assistance & counseling resources but patient did not answer. Patient's phone went straight to voicemail but CSW was able to leave a HIPPA compliant voicemail (ph#: [phone]) and had CMA mail out unsuccessful outreach letter along with [place] [facility] [place] list of counseling options. CSW will make 3rd attempt to reach patient in 4 days. CSW will make a third outreach attempt in 10 days, if CSW does not receive a return call from patient in the meantime.      Raynaldo Opitz, LCSW [facility]  Clinical Social Worker cell #: [phone]

## 2019-04-20 ENCOUNTER — Ambulatory Visit: Payer: HMO | Admitting: Registered"

## 2019-04-22 ENCOUNTER — Other Ambulatory Visit: Payer: Self-pay | Admitting: *Deleted

## 2019-04-23 NOTE — Patient Outreach (Addendum)
[place] [facility]) Care Management  04/23/2019  [name] [name] [birth year] [id number]   CSW made a third & final attempt reach patient to follow-up on referral made by [facility] RNCM, Davina for food resources & counseling resources but patient did not answer. CSW left HIPPA compliant voicemail and previously had CMA mail out unsuccessful outreach letter along with resources for [name], [place]. CSW will close case if no response in 10 days.    Raynaldo Opitz, LCSW [facility]  Clinical Social Worker cell #: [phone]

## 2019-05-03 ENCOUNTER — Other Ambulatory Visit: Payer: Self-pay | Admitting: Physician Assistant

## 2019-05-04 ENCOUNTER — Other Ambulatory Visit: Payer: Self-pay | Admitting: *Deleted

## 2019-05-11 ENCOUNTER — Encounter: Payer: Self-pay | Admitting: *Deleted

## 2019-05-11 ENCOUNTER — Other Ambulatory Visit: Payer: Self-pay | Admitting: *Deleted

## 2019-05-11 NOTE — Patient Outreach (Signed)
[facility]) Care Management  05/11/2019  [name] [name] 14-Jun-1955 [id number]   CSW made an initial attempt to try and contact patient today to perform the initial phone assessment, as well as assess and assist with social work needs and services, without success.  A HIPAA compliant message was left for patient on voicemail.  CSW is currently awaiting a return call.  CSW will make a second outreach attempt within the next 3-4 business days, if a return call is not received from patient in the meantime.  CSW will also mail an Outreach Letter to patient's home requesting that patient contact CSW if patient is interested in receiving social work services through [place] with Scientist, clinical (histocompatibility and immunogenetics).  Referral received to provide counseling and supportive services for symptoms of depression, a list of community agencies and resources that assist with finances and bill payment and a list of community agencies and resources providing food assistance.    [name], BSW, MSW, LCSW  Licensed Education officer, environmental Health System  Mailing [place] [address] [place], [place] [address] Physical Address[address], [place], [place] [address] Toll Free Main # [phone] Fax # [phone] Cell # [phone]  Office # [phone] [email or web address]

## 2019-05-14 ENCOUNTER — Other Ambulatory Visit: Payer: Self-pay | Admitting: *Deleted

## 2019-05-14 NOTE — Patient Outreach (Signed)
[facility] [facility]) Care Management  05/14/2019  [name] [name] 1955/11/18 [id number]   CSW made a second attempt to try and contact patient today to perform phone assessment, as well as assess and assist with social work needs and services, without success.  A HIPAA compliant message was left for patient on voicemail.  CSW continues to await a return call.  CSW will make a third and final outreach attempt within the next 3-4 business days, if a return call is not received from patient in the meantime.  CSW will then proceed with case closure if a return call is not received from patient with a total of 10 business days, as required number of phone attempts will have been made and outreach letter mailed.   [name], BSW, MSW, LCSW  Licensed Education officer, environmental Health System  Mailing [place] [address] [place], [place] [address] Physical Address-[address], [place], [place] [address] Toll Free Main # [phone] Fax # [phone] Cell # [phone]  Office # [phone] [email or web address]

## 2019-05-20 ENCOUNTER — Ambulatory Visit: Payer: HMO

## 2019-05-20 ENCOUNTER — Other Ambulatory Visit: Payer: Self-pay | Admitting: *Deleted

## 2019-05-20 NOTE — Patient Outreach (Signed)
[facility]) Care Management  05/20/2019  [name] [name] [birth year] [id number]    CSW made a third and final attempt to try and contact patient today to perform the initial phone assessment, as well as assess and assist with social work needs and services, without success.  A HIPAA compliant message was left for patient on voicemail.  CSW continues to await a return call.  CSW will proceed with case closure in three business days, if a return call is not received in the meantime, as required number of phone attempts have been made and an outreach letter was mailed to patient's home allowing 10 business days for a response.  [name], BSW, MSW, LCSW  Licensed Education officer, environmental Health System  Mailing [place] [address] [place], [place] [address] Physical Address[address], [place],  [address] Toll Free Main # [phone] Fax # [phone] Cell # [phone]  Office # [phone] [email or web address]

## 2019-06-04 ENCOUNTER — Ambulatory Visit: Payer: HMO | Admitting: *Deleted

## 2019-06-17 ENCOUNTER — Encounter: Payer: Self-pay | Admitting: *Deleted

## 2019-06-17 ENCOUNTER — Other Ambulatory Visit: Payer: Self-pay | Admitting: *Deleted

## 2019-06-17 NOTE — Patient Outreach (Signed)
[name] [name] [facility]) Care Management  06/17/2019  [name] [name] 05-07-1955 [id number]   CSW made a fourth and final attempt to try and contact patient today to perform the initial phone assessment, as well as assess and assist with social work needs and services; however, patient was always unavailable at the time of CSW's calls.  CSW has left HIPAA compliant messages on voicemail for patient, while awaiting a return call.  CSW will proceed with case closure, as required number of phone attempts were made and Unsuccessful Outreach Letter mailed to patient's home, allowing a total of 4 weeks for patient to respond if patient was interested in receiving social work services and resources through [place] with Scientist, clinical (histocompatibility and immunogenetics).  CSW will notify patient's Primary Care Physician, Dr. Domingo Mend of CSW's plans to close patient's case, as well as send a Physician Case Closure Letter.  CSW will also mail a Case Closure Letter to patient's home.  Last, CSW will route this note to Quinn Plowman, [facility] and individual placing the referral.  [name], BSW, MSW, [place]  Licensed Clinical Social Worker  [place]  Mailing [place] [place], [place] [address] Physical Address-[address], [place],  [address] Toll Free Main # [phone] Fax # [phone] Cell # [phone]  Office # [phone] [email or web address]

## 2019-06-24 ENCOUNTER — Other Ambulatory Visit: Payer: Self-pay

## 2019-06-25 ENCOUNTER — Ambulatory Visit: Payer: HMO | Admitting: Internal Medicine

## 2019-07-02 ENCOUNTER — Other Ambulatory Visit: Payer: Self-pay

## 2019-07-02 NOTE — Patient Outreach (Signed)
  [facility]) Care Management Chronic Special Needs Program    07/02/2019  Name: [name] [name], DOB: September 23, 1955  MRN: [id number]   [name] is enrolled in a chronic special needs plan for Diabetes. Telephone call to client for CSNP assessment follow up. Unable to reach client. HIPAA compliant voice message left with call back phone number.   PLAN;  RNCM will attempt 2nd telephone call to client in 1 week.   Quinn Plowman RN,BSN,CCM [facility] [phone]

## 2019-07-03 ENCOUNTER — Other Ambulatory Visit: Payer: Self-pay

## 2019-07-03 NOTE — Patient Outreach (Signed)
  [facility]) Care Management Chronic Special Needs Program    07/03/2019  Name: [name] [name], DOB: [birth year]  MRN: [id number]   [name] is enrolled in a chronic special needs plan for Diabetes. Second telephone call to client for CSNP assessment follow up. Unable to reach. HIPAA compliant voice message left with call back phone number.   PLAN; RNCM will attempt 3rd telephone call to client in 1 week.   Quinn Plowman RN,BSN,CCM [facility] [phone]

## 2019-07-07 ENCOUNTER — Other Ambulatory Visit: Payer: Self-pay

## 2019-07-07 NOTE — Patient Outreach (Signed)
[place] [facility]) Care Management Chronic Special Needs Program  07/07/2019  Name: [name] [name] DOB: 1955-03-07  MRN: [id number]  Mr. Mcclain Shall is enrolled in a chronic special needs plan for Diabetes. Telephone call to client regarding CSNP assessment follow up. Client report he is doing about the same. He states his number 1 issues is his knee pain. Client states he saw the orthopedic doctor and had an injection in his right knee. He reports the injection has not helped. RN case manager advised client to contact the orthopedic office to schedule a follow up appointment to discuss ongoing symptoms. Client reports he is using a cane now when ambulating. Client reports he is aware of his previous Hgb A1c of 15.1.  He states he is currently on a 65 gram carb diet at each meal as advised by his doctor to gain weight. He reports being on a sliding scale with his insulin to adjust for the carbs.   .    Goals Addressed              This Visit's Progress   .  COMPLETED: " I can't sleep , I took a mirtazapine last night and it really helped, is that OK to keep doing?"        Client reports he is sleeping much better with his current medication mirtazapine.     Marland Kitchen  "I'm fearful of dying and depressed because of my poor health" (pt-stated)   Not on track     Patient allowed to discuss his concerns of knee pain and wanting to gain weight.  Continue to adhere to your doctor advised diet for increase weight gain.  Contact your doctor for ongoing questions/ concerns.  Follow up with your doctor as recommended.  Contact your assigned RN case manager if you would like to have follow up with social worker in the future.  Continue to take your prescribed medication as recommended.  Contact your orthopedic doctor to schedule a return visit to discuss ongoing knee pain after receiving injection.         .  Client understands the importance of follow-up with providers by attending  scheduled visits   On track     Last primary care provider visit  03/20/19, 06/02/19 Continue to maintain and keep follow up visits with your providers.     .  Client will report having no falls within 3 months.   On track     Continue to use cane assistive device as recommended by your doctor.  In home safety: Use plenty of lighting, keep walkways clear of clutter and remove tripping hazards such as cords or throw rugs.   Outside safety:  Slow down and look ahead, avoid problem areas that have uneven or slippery pavement, watch out for slippery flooring, especially polished surfaces, wear proper non rubber soled shoes.  Always carry a cell phone or medical alert device with your.      .  Client will report improved coping with in the next 3 months   No change     Client declined need for social work follow up at this time.  Client reports knee pain is major concern. Contact your orthopedic doctor to schedule a return follow up visit for reassessment after recent injection.  Discuss ongoing knee pain concerns at your next primary care provider visit.     .  Client will report increase weight gain in 3 months.   On track  Client reports his current weight is 158 lbs up from 145 lbs. Continue to adhere to carbohydrate diet as recommended by your doctor.  Contact your RN case manager if referral to health coach for dietary follow up is needed in the future.     .  COMPLETED: Client will report no worsening of symptoms related to heart disease within the next 3 months      .  Client will verbalize knowledge of diabetes self-management as evidenced by Hgb A1C <7 or as defined by provider.        Most recent Hgb A1c on 01/06/19 was 15.1% Continue to adhere to diabetes self management actions:  Glucose monitoring per provider recommendation ( using free style libre)  Visit provider every 3-6 months as directed  Hbg A1C level every 3-6 months.  Carbohydrate controlled meal planning  Taking  diabetes medication as prescribed by provider  Physical activity  Contact your doctor if you have questions      .  HEMOGLOBIN A1C < 7        Continue to adhere to diabetes self management actions:  Glucose monitoring per provider recommendation ( using free style libre)  Visit provider every 3-6 months as directed  Hbg A1C level every 3-6 months.  Carbohydrate controlled meal planning  Taking diabetes medication as prescribed by provider (adhere to sliding scale as prescribed by your doctor )  Physical activity  Contact your doctor if you have questions Discuss with your doctor possible need for referral to endocrinologist.       .  Maintain timely refills of diabetic medication as prescribed within the year .   On track     Client reports he maintains timely refills of his diabetic medication Call your assigned RN case manager [phone] If you have difficulty obtaining medications.  Continue to take your medications as prescribed and contact your doctor if you have questions or concerns.     .  COMPLETED: Obtain annual  Lipid Profile, LDL-C        Annual Lipid Profile completed on 01/06/19, LDL= 53    .  Obtain Annual Eye (retinal)  Exam    Not on track     Unable to determine last documented eye exam Contact your [employer] concierge to request a list of participating plan providers and to discuss information regarding your coverage benefit for an annual eye exam.     .  Obtain Annual Foot Exam   Not on track     Unable to determine your last annual foot exam.  Your doctor should check your bare feet at each visit.  It is important that your doctor check your feet regularly. Discuss this with your doctor at your next visit.   Diabetes foot care - Check feet daily at home (look for skin color changes, cuts, sores or cracks in the skin, swelling of feet or ankles, ingrown or fungal toenails, corn or calluses). Report these findings to your doctor - Wash feet  with soap and water, dry feet well especially between toes - Moisturize your feet but not between the toes - Always wear shoes that protect your whole feet.      .  COMPLETED: Obtain annual screen for micro albuminuria (urine) , nephropathy (kidney problems)        Your last documented Micro Albuminuria was completed on 01/06/19     .  Obtain Hemoglobin A1C at least 2 times per year   On track  01/06/19  Hgb A1C= 15.1% Have your Hgb A1c checked every 6 months if you are at goal or every 3 months if you are not at your Hgb A1c goal.       .  Visit Primary Care Provider or Endocrinologist at least 2 times per year    On track     Last primary care provider visit  03/20/19, 06/02/19 Continue to maintain and keep follow up visits with your providers.        Plan: RNCM will send client and primary care provider updated individualized care plan  Chronic care management coordinator will outreach in:  3 Months    Quinn Plowman RN,BSN,CCM Chronic Care Management Coordinator [facility] [phone]    .

## 2019-07-08 ENCOUNTER — Ambulatory Visit: Payer: Self-pay

## 2019-08-03 ENCOUNTER — Other Ambulatory Visit: Payer: Self-pay | Admitting: Internal Medicine

## 2019-08-03 DIAGNOSIS — R809 Proteinuria, unspecified: Secondary | ICD-10-CM

## 2019-08-03 DIAGNOSIS — E1165 Type 2 diabetes mellitus with hyperglycemia: Secondary | ICD-10-CM

## 2019-08-04 DIAGNOSIS — M25561 Pain in right knee: Secondary | ICD-10-CM | POA: Diagnosis not present

## 2019-08-05 ENCOUNTER — Other Ambulatory Visit: Payer: Self-pay

## 2019-08-05 ENCOUNTER — Telehealth (INDEPENDENT_AMBULATORY_CARE_PROVIDER_SITE_OTHER): Payer: HMO | Admitting: Internal Medicine

## 2019-08-05 DIAGNOSIS — I10 Essential (primary) hypertension: Secondary | ICD-10-CM | POA: Diagnosis not present

## 2019-08-05 DIAGNOSIS — E1165 Type 2 diabetes mellitus with hyperglycemia: Secondary | ICD-10-CM

## 2019-08-05 DIAGNOSIS — E785 Hyperlipidemia, unspecified: Secondary | ICD-10-CM | POA: Diagnosis not present

## 2019-08-05 DIAGNOSIS — M25561 Pain in right knee: Secondary | ICD-10-CM | POA: Diagnosis not present

## 2019-08-05 DIAGNOSIS — G8929 Other chronic pain: Secondary | ICD-10-CM | POA: Diagnosis not present

## 2019-08-05 NOTE — Progress Notes (Signed)
Virtual Visit via Telephone Note  I connected with [name] [name] on 08/05/19 at  3:30 PM EDT by telephone and verified that I am speaking with the correct person using two identifiers.   I discussed the limitations, risks, security and privacy concerns of performing an evaluation and management service by telephone and the availability of in person appointments. I also discussed with the patient that there may be a patient responsible charge related to this service. The patient expressed understanding and agreed to proceed.  Location patient: home Location provider: work office Participants present for the call: patient, provider Patient did not have a visit in the prior 7 days to address this/these issue(s).   History of Present Illness:  He has scheduled this visit to update me on his recent medical treatment for his knee.  His right knee continues to be a big issue for him.  Impedes his sleep, he is not able to exercise, and has decreased his appetite.  He has seen several orthopedics this past year.  He has an MRI coming up soon.  He failed cortisone and viscous knee injections.  He had an A1c of 15.1 back in January.  Have not seen him in office since.  He is on an insulin regimen of long-acting and short-acting.  He still complains of not being able to gain weight.  He states his CBGs are improved, they are around 180 on average.  He wanted my opinion on whether it was okay to hold his medical care until he gets his knee taken care of.  "I want to cancel everything for now that does not pertain to my knee until I can get that under control".   Observations/Objective: Patient sounds cheerful and well on the phone. I do not appreciate any increased work of breathing. Speech and thought processing are grossly intact. Patient reported vitals: none reported   Current Outpatient Medications:  .  Ascorbic Acid (VITAMIN C) 100 MG tablet, Take 100 mg by mouth daily., Disp: , Rfl:    .  aspirin 325 MG EC tablet, Take 325 mg by mouth daily., Disp: , Rfl:  .  atorvastatin (LIPITOR) 40 MG tablet, Take 40 mg by mouth daily., Disp: , Rfl:  .  B Complex Vitamins (B COMPLEX-B12 PO), Take by mouth daily., Disp: , Rfl:  .  cholecalciferol (VITAMIN D3) 25 MCG (1000 UT) tablet, Take 1,000 Units by mouth daily., Disp: , Rfl:  .  Continuous Blood Gluc Receiver (FREESTYLE LIBRE 14 DAY READER) DEVI, 1 each by Does not apply route daily., Disp: 1 each, Rfl: 0 .  Continuous Blood Gluc Sensor (FREESTYLE LIBRE 14 DAY SENSOR) MISC, 1 each by Does not apply route daily., Disp: 2 each, Rfl: 5 .  ELDERBERRY PO, Take 1,250 mg by mouth daily., Disp: , Rfl:  .  gabapentin (NEURONTIN) 300 MG capsule, Take 1 capsule (300 mg total) by mouth at bedtime., Disp: 90 capsule, Rfl: 1 .  insulin aspart (NOVOLOG FLEXPEN) 100 UNIT/ML FlexPen, Inject 16 Units into the skin 3 (three) times daily with meals. (Patient taking differently: Inject 10 Units into the skin 3 (three) times daily with meals. With sliding scale: CBG<200 no additional novolog CBG 201-250 4 units CBG 251-300 6 units CBG 300-350 8 units CBG 151-400 10 units), Disp: 15 mL, Rfl: 3 .  insulin glargine (LANTUS) 100 UNIT/ML injection, Inject 12 Units into the skin daily. Pt uses at bedtime, Disp: , Rfl:  .  Insulin Pen Needle (DROPLET  PEN NEEDLES) 32G X 4 MM MISC, Use daily for glucose control, Disp: 100 each, Rfl: 5 .  lisinopril (ZESTRIL) 5 MG tablet, TAKE 1 TABLET BY MOUTH DAILY, Disp: 90 tablet, Rfl: 1 .  metFORMIN (GLUCOPHAGE) 1000 MG tablet, Take 1,000 mg by mouth 2 (two) times daily with a meal., Disp: , Rfl:  .  mirtazapine (REMERON SOL-TAB) 30 MG disintegrating tablet, Take 1 tablet (30 mg total) by mouth at bedtime., Disp: 30 tablet, Rfl: 1 .  Multiple Vitamin (MULTIVITAMIN ADULT PO), Take by mouth daily., Disp: , Rfl:  .  Omega 3-6-9 Fatty Acids (OMEGA 3-6-9 PO), Take by mouth daily., Disp: , Rfl:  .  PARoxetine (PAXIL) 20 MG tablet, Take 20  mg by mouth daily., Disp: , Rfl:  .  traMADol (ULTRAM) 50 MG tablet, Take 1 tablet (50 mg total) by mouth 2 (two) times daily as needed., Disp: 30 tablet, Rfl: 0  Review of Systems:  Constitutional: Denies fever, chills, diaphoresis.Marland Kitchen  HEENT: Denies photophobia, eye pain, redness, hearing loss, ear pain, congestion, sore throat, rhinorrhea, sneezing, mouth sores, trouble swallowing, neck pain, neck stiffness and tinnitus.   Respiratory: Denies SOB, DOE, cough, chest tightness,  and wheezing.   Cardiovascular: Denies chest pain, palpitations and leg swelling.  Gastrointestinal: Denies nausea, vomiting, abdominal pain, diarrhea, constipation, blood in stool and abdominal distention.  Genitourinary: Denies dysuria, urgency, frequency, hematuria, flank pain and difficulty urinating.  Endocrine: Denies: hot or cold intolerance, sweats, changes in hair or nails. Musculoskeletal: Denies myalgias, back pain, joint swelling, arthralgias and gait problem.  Skin: Denies pallor, rash and wound.  Neurological: Denies dizziness, seizures, syncope, weakness, light-headedness, numbness and headaches.  Hematological: Denies adenopathy. Easy bruising, personal or family bleeding history  Psychiatric/Behavioral: Denies suicidal ideation, mood changes, confusion, nervousness, sleep disturbance and agitation   Assessment and Plan:  Hyperlipidemia, unspecified hyperlipidemia type  Uncontrolled type 2 diabetes mellitus with hyperglycemia ([place])  Essential hypertension  Chronic pain of right knee  -I have told him that I do not necessarily agree with his decision to not manage his diabetes for now.  I am concerned about short and long-term complications of very uncontrolled diabetes. -Nonetheless, I will remain available to him as needed.    I discussed the assessment and treatment plan with the patient. The patient was provided an opportunity to ask questions and all were answered. The patient agreed with  the plan and demonstrated an understanding of the instructions.   The patient was advised to call back or seek an in-person evaluation if the symptoms worsen or if the condition fails to improve as anticipated.  I provided 22 minutes of non-face-to-face time during this encounter.   Lelon Frohlich, MD [facility]

## 2019-08-27 DIAGNOSIS — M25561 Pain in right knee: Secondary | ICD-10-CM | POA: Diagnosis not present

## 2019-09-04 DIAGNOSIS — M25561 Pain in right knee: Secondary | ICD-10-CM | POA: Diagnosis not present

## 2019-09-28 ENCOUNTER — Other Ambulatory Visit: Payer: Self-pay

## 2019-09-28 NOTE — Patient Outreach (Signed)
  [place] [facility]) Care Management Chronic Special Needs Program    09/28/2019  Name: Christon Parada, DOB: 1955-12-18  MRN: [id number]   Mr. Lucas Winograd is enrolled in a chronic special needs plan for Diabetes .  Telephone call to client for CSNP assessment follow up. Unable to reach. HIPAA compliant voice message left with call back phone number and return call request.   PLAN; RNCM will attempt 2nd telephone call to client in 2 weeks.   Quinn Plowman RN,BSN,CCM Chronic Care Management Coordinator [place] [facility] [phone]                     .

## 2019-09-30 ENCOUNTER — Other Ambulatory Visit: Payer: Self-pay

## 2019-09-30 NOTE — Patient Outreach (Signed)
°  [place] [facility]) Care Management Chronic Special Needs Program    09/30/2019  Name: [name] [name], DOB: 06-03-1955  MRN: [id number]   [name] is enrolled in a chronic special needs plan for Diabetes. Second telephone call to client for CSNP assessment follow up. Unable to reach. HIPAA compliant voice message left with call back phone number and return call request.   PLAN;RNCM will attempt 3rd telephone call to client in 2 weeks. Bonne Dolores RN,BSN,CCM [place] [facility] [phone]

## 2019-10-01 ENCOUNTER — Ambulatory Visit: Payer: Self-pay

## 2019-10-02 ENCOUNTER — Other Ambulatory Visit: Payer: Self-pay

## 2019-10-02 NOTE — Patient Outreach (Signed)
[place] [facility]) Care Management Chronic Special Needs Program  10/02/2019  Name: [name] [name] DOB: April 17, 1955  MRN: [id number]  [name] is enrolled in a chronic special needs plan for Diabetes.  Telephone call to client for CSNP assessment follow up. Unable to reach. HIPAA compliant voice message left with call back phone number and return call request.  Reviewed and updated care plan based on available data.    Goals Addressed              This Visit's Progress   .  "I'm fearful of dying and depressed because of my poor health" (pt-stated)   On track     Continue to follow recommendations below:  Patient allowed to discuss his concerns of knee pain and wanting to gain weight.  Continue to adhere to your doctor advised diet for increase weight gain.  Contact your doctor for ongoing questions/ concerns.  Follow up with your doctor as recommended.  Contact your assigned RN case manager if you would like to have follow up with social worker in the future.  Continue to take your prescribed medication as recommended.  Contact your orthopedic doctor to schedule a return visit to discuss ongoing knee pain after receiving injection.         .  COMPLETED: Client understands the importance of follow-up with providers by attending scheduled visits   On track     Last primary care provider visit  03/20/19, 06/02/19, and 08/05/19 (video visit)      .  Client will report having no falls within 3 months.   On track     Continue to use cane assistive device as recommended by your doctor.  Adhere to In home safety guidelines: Use plenty of lighting, keep walkways clear of clutter and remove tripping hazards such as cords or throw rugs.   Adhere to outside safety guidelines:  Slow down and look ahead, avoid problem areas that have uneven or slippery pavement, watch out for slippery flooring, especially polished surfaces, wear proper non rubber soled shoes.  Always  carry a cell phone or medical alert device with your.      .  Client will report improved coping with in the next 3 months   On track     Unable to discuss with client.  RN case manager will send client education article: Helping you cope.  Follow up with your orthopedic doctor as recommended for ongoing knee pain     .  Client will report increase weight gain in 3 months.   On track     Unable to discuss with client.  Eat more frequently.  Choose nutrient -rich foods RN case manager will send client education article: Minimize weight loss. Contact your RN case manager if referral to health coach for dietary follow up is needed in the future.     .  Client will verbalize knowledge of diabetes self-management as evidenced by Hgb A1C <7 or as defined by provider.   On track     Your last documented Hgb A1c was 15.1%  Review [employer] calendar sent in the mail for Diabetes Action plan.  Continue to adhere to diabetes self management actions:  Glucose monitoring per provider recommendation ( using free style libre)  Visit provider every 3-6 months as directed  Hbg A1C level every 3-6 months.  Carbohydrate controlled meal planning  Taking diabetes medication as prescribed by provider  Physical activity  Contact your doctor if  you have questions      .  HEMOGLOBIN A1C < 7        Your last documented Hgb A1c was 15.1 on 01/06/19. Have your Hgb A1c checked every 6 months if you are at goal or every 3 months if you are not at goal.  Plan to eat low carbohydrated and low salt meals, watch portion sizes and avoid sugar sweetened drinks.      .  Maintain timely refills of diabetic medication as prescribed within the year .   On track     Review of electronic medical record medication dispense report indicates client maintains timely refills of diabetic medication.  Take your medication as prescribed.  Follow up with your doctor as recommended.  Call your assigned RN case  manager [phone] If you have difficulty obtaining medications.       .  Obtain Annual Eye (retinal)  Exam    On track     Unable to determine your last documented eye exam Regular eye exams can help catch eye damage that can happen with diabetes. RN case manager will send client education article: Diabetic retinopathy Plan to schedule and/ or have a dilated eye exam every year.     .  Obtain Annual Foot Exam   On track     Unable to determine your last annual foot exam.  An Annual foot examination is important to identify high risk foot conditions.  When foot problems are found and treated early it can prevent serious complications. Discuss having an annual foot exam with your doctor.  Continue Diabetes foot care - Check feet daily at home (look for skin color changes, cuts, sores or cracks in the skin, swelling of feet or ankles, ingrown or fungal toenails, corn or calluses). Report these findings to your doctor - Wash feet with soap and water, dry feet well especially between toes - Moisturize your feet but not between the toes - Always wear shoes that protect your whole feet.       .  Obtain Hemoglobin A1C at least 2 times per year   On track     01/06/19  Hgb A1C= 15.1% Hgb A1c is an important tool for managing your diabetes. It is important to have your Hgb A1c checked regularly. Please discuss this with your doctor at your next visit.       .  COMPLETED: Visit Primary Care Provider or Endocrinologist at least 2 times per year    On track     Last primary care provider visit  03/20/19, 06/02/19, 08/05/19        Plan:  Send successful outreach letter with a copy of their individualized care plan, Send individual care plan to provider and Send educational material  Chronic care management coordinator will outreach in:  3 Months    Quinn Plowman RN,BSN,CCM [place] [facility] [phone]    .

## 2019-10-05 ENCOUNTER — Ambulatory Visit: Payer: Self-pay

## 2019-10-05 DIAGNOSIS — S83289A Other tear of lateral meniscus, current injury, unspecified knee, initial encounter: Secondary | ICD-10-CM | POA: Insufficient documentation

## 2019-10-07 ENCOUNTER — Other Ambulatory Visit: Payer: Self-pay

## 2019-10-07 ENCOUNTER — Other Ambulatory Visit: Payer: Self-pay | Admitting: Internal Medicine

## 2019-10-07 NOTE — Patient Outreach (Signed)
[place] [facility]) Care Management Chronic Special Needs Program  10/07/2019  Name: [name] [name] DOB: 18-Oct-1955  MRN: [id number]  [name] is enrolled in a chronic special needs plan for Diabetes. Telephone call to client for CSNP assessment follow up. HIPAA verified.  Client reports he continues to have right knee pain that effects his sleep and overall quality of life. Client states he was scheduled to see a new orthopedic doctor but was unable to keep two of the appointments because the pain of his knee impacted his ability to drive on the scheduled appointment days. Client states his son lives with him and can assist as needed.   Client reports having a MRI but unable to obtain results.  RNCM advised client to follow up with his primary care provider regarding his MRI results. Client verbalized understanding. Client reports having 2 falls without severe injury in the last 3 months. Client reports having anxiety/ depression symptoms. RNCM discussed and offered referral to social worker for follow up. Client verbally agreed. Client reports he runs of of his insulin monthly and states he does not take his blood pressure medication insulin because he is unable to open the bottle. RNCM discussed and offered referral to pharmacist for medication management. Client verbally agreed. RNCM discussed option of using pill box or medication bubble packets that can be arranged by his pharmacist. Advised client to discuss this further with the pharmacist once contacted. Client verbalized understanding.    Goals Addressed              This Visit's Progress   .  "I'm fearful of dying and depressed because of my poor health" (pt-stated)   Not on track     RN case manager will refer client to social worker. Discuss anxiety/ depression symptoms with your provider.  RN case manager will forward client's depression screening results to provider.  Continue to take prescribed  medication. Discuss medication effectiveness with your provider. .         .  Client will report having no falls within 3 months.   Not on track     Client reports 2 falls within the past 3 months.  RN advised client to access assistance from family member when showering Consider installing grab bars in bathroom in shower and toilet area Continue to use cane assistive device as recommended by your doctor.  Continue to adhere to below safety guidelines:  Use plenty of lighting, keep walkways clear of clutter and remove tripping hazards such as cords or throw rugs.   Slow down and look ahead, avoid problem areas that have uneven or slippery pavement, watch out for slippery flooring, especially polished surfaces, wear proper non rubber soled shoes.  Always carry a cell phone or medical alert device with your.      .  Client will report improved coping with in the next 3 months   No change     Client reports being overwhelmed with health condition (Knee pain) Client advised to reschedule appointment with new orthopedic doctor Client advised to contact his primary care provider regarding MRI results  RN case manager will refer client to social worker for anxiety/depression/ sleep  management.      .  Client will report increase weight gain in 3 months.   Not on track     Client reports ongoing weight loss. Eat more frequently.  Choose nutrient -rich foods Contact your RN case manager if referral to health coach for dietary  follow up is needed in the future.  RN case manager will refer client to social worker  Discuss concerns regarding weight lost with your provider.     .  Client will verbalize knowledge of diabetes self-management as evidenced by Hgb A1C <7 or as defined by provider.   Not on track     Your last documented Hgb A1c was 15.1%  Client reports having difficulty obtaining his insulin. RN case manager will refer client to pharmacist for medication assistance.  Discussed  diabetes self management actions:  Glucose monitoring per provider recommendation ( using free style libre)  Visit provider every 3-6 months as directed  Hbg A1C level every 3-6 months.  Carbohydrate controlled meal planning  Taking diabetes medication as prescribed by provider  Physical activity  Contact your doctor if you have questions      .  HEMOGLOBIN A1C < 7        Your last documented Hgb A1c was 15.1 on 01/06/19.   Continue to have your Hgb A1c checked every 6 months if you are at goal or every 3 months if you are not at goal. Continue to check blood sugars daily before eating with a goal of 80-130.  You can also check 1 1/2 hours after eating with goal of 180 or less.  Plan to eat low carbohydrate and low salt meals, watch portion sizes and avoid sugar sweetened drinks      .  Maintain timely refills of diabetic medication as prescribed within the year .   Not on track     RN case manager will refer client to pharmacist for medication assistance/ management Contact your primary care provider to discuss concerns/ questions about your medications.     .  Obtain Annual Eye (retinal)  Exam    Not on track     Unable to determine your last documented eye exam Diabetes can affect your vision.  Plan to have a dilated eye exam every year.       .  Obtain Annual Foot Exam   Not on track     Unable to determine your last annual foot exam.  It is important that your doctor check your feet regularly. Discuss this with your doctor at your next visit.  RN case manager will send client education article: Foot care for Diabetes       .  Obtain Hemoglobin A1C at least 2 times per year (pt-stated)   Not on track     Your last documented Hgb A1c was 15.1 on 01/06/19. Have your Hgb A1c checked every 6 months if you are at goal or every 3 months if you are not at goal. Check blood sugars daily before eating with a goal of 80-130.  You can also check 1 1/2 hours after eating with goal  of 180 or less.  Plan to eat low carbohydrate and low salt meals, watch portion sizes and avoid sugar sweetened drinks.          .  patient describes a reduction in the level of anxiety/ depression symptoms experienced within 3 months.        RN case manager encouraged client to talk about his feelings RN case manager will send client education article: Tips for how the help your mood and When you have depression and another health problem RN case manager will refer client to social worker.  RN case manager will send completed anxiety/ depression screening to client's primary care provider.  Plan:  Send letter with individualized care plan to primary care provider and client.   Chronic care management coordinator will outreach in:  3 Months  Will refer to:  Social Work and Pharmacy   Quinn Plowman RN,BSN,CCM [place] [facility] [phone]     .

## 2019-10-08 ENCOUNTER — Other Ambulatory Visit: Payer: Self-pay | Admitting: Internal Medicine

## 2019-10-09 NOTE — Patient Outreach (Signed)
Received a Pharmacy referral for a [employer] patient. The Primary Care Physician is using [facility] services.   I have sent an email referral to: Anne Ng DeSantiago  with [facility].

## 2019-10-14 ENCOUNTER — Telehealth: Payer: HMO | Admitting: Internal Medicine

## 2019-10-14 ENCOUNTER — Other Ambulatory Visit: Payer: Self-pay | Admitting: *Deleted

## 2019-10-14 ENCOUNTER — Encounter: Payer: Self-pay | Admitting: *Deleted

## 2019-10-14 NOTE — Patient Outreach (Signed)
[facility] [facility]) Care Management  10/14/2019  [name] [name] [birth year] [id number]   CSW made an initial attempt to try and contact patient today to perform the initial phone assessment, as well as assess and assist with social work needs and services, without success.  A HIPAA compliant message was left for patient on voicemail.  CSW is currently awaiting a return call.  CSW will make a second outreach attempt within the next 3-4 business days, if a return call is not received from patient in the meantime.  CSW will also mail a Patient Unsuccessful Outreach Letter to patient's home, requesting that he contact CSW directly if he is interested in receiving social work services through [place] with [facility].  [name], BSW, MSW, LCSW  Licensed Education officer, environmental Health System  Mailing [place] [address] [place], [place] [address] Physical Address[address], [place], [place] [address] Toll Free Main # [phone] Fax # [phone] Cell # [phone]  [name]

## 2019-10-19 ENCOUNTER — Other Ambulatory Visit: Payer: Self-pay | Admitting: *Deleted

## 2019-10-19 NOTE — Patient Outreach (Signed)
[place] [facility]) Care Management  10/19/2019  [name] [name] 12-01-1955 [id number]   CSW made a second attempt to try and contact patient today to perform the initial phone assessment, as well as assess and assist with social work needs and services, without success.  A HIPAA compliant message was left for patient on voicemail.  CSW continues to await a return call.  CSW will make a third outreach attempt within the next 3-4 business days, if a return call is not received from patient in the meantime.    [name], BSW, MSW, LCSW  Licensed Education officer, environmental Health System  Mailing [place] [address], [place], [place] [address] Physical Address[address] [place], [place] [address] Toll Free Main # [phone] Fax # [phone] Cell # [phone]  [email or web address]

## 2019-10-23 ENCOUNTER — Other Ambulatory Visit: Payer: Self-pay | Admitting: *Deleted

## 2019-10-23 ENCOUNTER — Encounter: Payer: Self-pay | Admitting: *Deleted

## 2019-10-23 DIAGNOSIS — E0865 Diabetes mellitus due to underlying condition with hyperglycemia: Secondary | ICD-10-CM

## 2019-10-23 DIAGNOSIS — E1165 Type 2 diabetes mellitus with hyperglycemia: Secondary | ICD-10-CM

## 2019-10-23 NOTE — Patient Outreach (Signed)
[facility]) Care Management  10/23/2019  Sandro Burgo [birth year] [id number]  CSW was able to make initial contact with patient today to perform phone assessment, as well as assess and assist with social work needs and services.  CSW introduced self, explained role and types of services provided through [facility] ([facility]).  CSW further explained to patient that CSW works with patient's Chronic Special Needs Plan Coordinator, also with [facility], Quinn Plowman.  CSW then explained the reason for the call, indicating that Mrs. Nyoka Cowden thought that patient would benefit from social work services and resources to assist with providing counseling and supportive services for symptoms of Anxiety, Depression and Suicidal Thoughts.  CSW obtained two HIPAA compliant identifiers from patient, which included patient's name and date of birth.  CSW first explained to patient that he scored a 6 on his PHQ-2 Depression Screening, and a 27 on his PHQ-9 Depression Screening, and that the results were sent to his Primary Care Physician, Dr. Lelon Frohlich, as these numbers were very concerning to [place].  Patient stated, "I don't doubt that, I have been having thoughts that I would be better off dead, because no one can to tell me why I am experiencing such excruciating pain in my right knee and leg".  Patient went on to explain that his chronic knee and leg pain began roughly a year and a half ago, effecting his overall quality of life, "no longer wanting to live if it means living in pain".  Patient further reported that the pain is effecting his sleep pattern, ability to perform activities of daily living, exercise routine, diet, relationships, etc.   Patient indicated that he has experienced 2 falls, with injury, within the last 3 months, because when he stands up he becomes dizzy and disoriented and sometimes his knee just "gives out".   Patient stated that the pain is debilitating, but that he is unable to take the pain medication prescribed to him, Tramadol 50 MG, PO, Twice Daily, PRN, because it makes him feel bad.  CSW inquired as to whether or not patient has reported the side effects to Dr. Deniece Ree so that she can recommend a different type of Opioid to try and relieve the pain, for which patient denied.  Patient then stated, "I have been trying to get an appointment with Dr. Deniece Ree, but her first available appointment is not until mid-January, I just can't wait that long".  Patient admitted that he needs to have labs drawn, prescriptions renewed, and get his A1C under control.    Patient further stated, "I contacted [facility] services to try to get established with a new primary care physician, but the number they provided is either incorrect or is no longer in service".  CSW inquired about the new referral, requesting that patient provide CSW with the name of the new PCP, wanting to verify the contact information, but patient could not recall the name of the physician or the practice.  Patient stated, "I have tried calling the number, several times a day, for the past three weeks, but the phone just continues to ring busy, with no answer".  Patient indicated that he is "extremely frustrated, unable to maneuver or navigate through the [place] or receive adequate care".  Patient verbalized that he has been to 2 sports medicine physicians and 3 orthopedic specialists, but that no one can locate the source of his pain.  Patient stated that he  has tried Cortizone shots, gel caps under his knee, physical therapy, etc., but that "nothing even remotely touches the pain".  Patient indicated that he was last seen by an orthopedic specialist in August of 2021, "walking out of the appointment, never to return", because he did not feel that his condition was being taken seriously or that he was being treated to his standard of  care/satisfaction.  Patient reported that he has been "dealing with a lot of inconsistencies", especially when it comes to his prescriptions for long-acting and short-acting insulin.  Patient verbalized that he is having to go two weeks without one or the other, receiving his prescription for short-acting insulin at the beginning of the month, but not receiving his long-acting insulin until the end of the month.  As a result, patient admitted that he is unable to get his Diabetes under control, having an A1C of 15.1, just 2 weeks ago.    Patient reported being overwhelmed with all of his current health conditions, "feeling like no one cares enough to take the time to find out what is wrong, or help resolve medical concerns".  Patient verbalized that he does not wish to die, but feels as though death is immanently inevitable.  Patient admitted that his 4 children are the only thing deterring him from ending his life.  CSW is aware that patient has a history of Depression and Anxiety, and has been prescribed Remeron 30 MG, PO, Daily (for which he is not taking), and Paxil 20 MG, PO, Daily.  Patient stated, "I would not be experiencing symptoms of Anxiety and Depression, or have thoughts of suicide, if I could just get my pain under control".  Patient denied having homicidal or suicidal thoughts at present, nor is patient a victim of domestic violence.  Patient indicated that he is not able to perform activities of daily living independently, or drive himself to and from his physician appointments, due to the excruciating pain in his right knee and leg.  Patient reported that his girlfriend has been assisting him around the house, and that this elderly mother has been transporting him to all of his physician appointments.  CSW reminded patient that he has a transportation benefit through [employer], providing him with the contact information, encouraging him to go ahead and contact them to get established  in their system.  CSW agreed to arrange a month's supply of Mom's Meals for patient, courtesy of [facility], as patient has received the prepared meals in the past and finds them to be very beneficial, especially while he is unable to bare weight on his right knee and leg.  CSW agreed to refer patient to a pharmacist with [facility], as well as converse with Mrs. Green about helping patient better navigate through the [place], with regards to establishing care with new providers.  Patient stated, "I need a new primary care physician, a retina specialist, an endocrinologist and an orthopedic specialist, for starters".  CSW voiced understanding, agreeing to forward this information to Dr. Deniece Ree and Mrs. Green, as well as place an order for a pharmacist.  Patient was agreeable to this plan, and most appreciative of CSW taking the time to provide attentive listening, empathy, counseling, supportive services and referrals to various specialists.  CSW will follow-up with patient again next week, on Friday, October 30, 2019, around 9:00 AM, to ensure that all of the above named services are in place.   [name], BSW, MSW, [employer]  Licensed Clinical Social Worker  [place]  Mailing [place] [address] [place], [place] [address] Physical Address-[address] [place], [place] [address] Toll Free Main # [phone] Fax # [phone] Cell # [phone]  [email or web address]

## 2019-10-30 ENCOUNTER — Other Ambulatory Visit: Payer: Self-pay | Admitting: *Deleted

## 2019-10-30 ENCOUNTER — Telehealth: Payer: HMO | Admitting: Internal Medicine

## 2019-10-30 NOTE — Patient Outreach (Signed)
[facility]) Care Management  10/30/2019  [name] [name] [birth year] [id number]   CSW made an attempt to try and contact patient today to follow-up regarding social work services and resources; however, patient was unavailable at the time of CSW's call.  CSW left a HIPAA compliant message on voicemail for patient, as is currently awaiting a return call.  CSW will make a second outreach attempt within the next 3-4 business days, if a return call is not received from patient in the meantime.  [name], BSW, MSW, LCSW  Licensed Education officer, [facility]  Mailing [place] [address] [place], [place] [address] Physical Address[address], [place], [place] [address] Toll Free Main # [phone] Fax # [phone] Cell # [phone]  [email or web address]

## 2019-11-04 ENCOUNTER — Encounter: Payer: Self-pay | Admitting: *Deleted

## 2019-11-04 ENCOUNTER — Other Ambulatory Visit: Payer: Self-pay | Admitting: *Deleted

## 2019-11-04 NOTE — Patient Outreach (Signed)
[name]) Care Management  11/04/2019  [name] [name] 05-05-1955 [id number]  CSW was able to make contact with patient today to follow-up regarding social work services and resources, as well as to confirm that patient has a follow-up appointment with his Primary Care Physician, Dr. Lelon Frohlich, scheduled for next week, on Wednesday, November 11, 2019, at 2:30pm.  Patient confirmed the appointment date, indicating that he must arrive to the appointment at 2:15pm.  During this appointment, CSW encouraged patient to address the following concerns with Dr. Deniece Ree, providing him with the following bullet points:  (1)  Daily Constipation - only able to move bowels once or twice per week, despite taking over-the-counter stool softeners. (2)  Prescription Refill - long acting insulin and short-acting insulin. (3)  Revisit Prescription Pain Medications - chronic knee and leg pain, effecting sleep pattern, ability to perform activities of daily living, exercise routine, diet     and po intake and relationships. (4)  Referral to Orthopedic Specialist and/or Sports Medicine Physician - identify source of knee and leg pain, recommend physical and/or occupational        therapies, and prescription medications to address pain management.   (5)  Referral or Recommendations for Retina Specialist. (6)  Referral or Recommendations for Endocrinologist.  Patient agreed to take down all of the above information, hoping to be able to address all of his concerns with Dr. Deniece Ree during next weeks appointment.  CSW explained to patient that CSW will also be forwarding this note, as well as today's conversation with patient, to Dr. Deniece Ree for her information.  CSW further explained to patient that CSW placed a referral to pharmacy on Friday, October 23, 2019; however, it does not appear that a pharmacist has made contact with patient, as of yet.  CSW agreed to follow-up on this  request.  CSW was able to confirm that patient received his two weeks supply of Mom's Meals, courtesy of [facility], in addition to [place] placing a referral for patient to Meals on Wheels, through [employer] [place].  Patient continues to deny homicidal and/or suicidal ideations, admitting that he said that "in hopes of finally being able to get someone's attention".  Patient also denies the need for counseling and supportive services, through [place], or a provider within the community.  CSW explained to patient that Quinn Plowman, [place] Coordinator, also with [facility], will continue to follow him to provide disease management services, and is scheduled to follow-up with him again on Monday, December 21, 2019, around 10:00am.  CSW will perform a case closure on patient, as all goals of treatment have been met from social work standpoint and no additional social work needs have been identified at this time.  CSW will notify Mrs. Green of CSW's plans to close patient's case.    CSW will fax an update to Dr. Deniece Ree, to ensure that she is aware of CSW's involvement with patient's plan of care, as well as route a Physician Case Closure Letter.  CSW was able to confirm that patient has the correct contact information for CSW, encouraging patient to contact CSW directly if he changes his mind about wanting to receive counseling services, or if additional social work needs arise in the near future.  Patient voiced understanding and was agreeable to this plan, appreciative of CSW's assistance.  [name], BSW, MSW, [employer]  Licensed Pensions consultant  Care Management [facility]  Mailing Address-[address], [place], [place] [address] Physical Address-[address] [place], [place] [address] Toll Free Main # [phone] Fax # [phone] Cell # [phone]   [email or web address]

## 2019-11-05 ENCOUNTER — Telehealth: Payer: Self-pay | Admitting: *Deleted

## 2019-11-05 ENCOUNTER — Ambulatory Visit: Payer: Self-pay | Admitting: *Deleted

## 2019-11-05 ENCOUNTER — Telehealth: Payer: Self-pay | Admitting: Internal Medicine

## 2019-11-05 DIAGNOSIS — N529 Male erectile dysfunction, unspecified: Secondary | ICD-10-CM

## 2019-11-05 DIAGNOSIS — N486 Induration penis plastica: Secondary | ICD-10-CM

## 2019-11-05 DIAGNOSIS — I10 Essential (primary) hypertension: Secondary | ICD-10-CM

## 2019-11-05 DIAGNOSIS — E785 Hyperlipidemia, unspecified: Secondary | ICD-10-CM

## 2019-11-05 DIAGNOSIS — E1165 Type 2 diabetes mellitus with hyperglycemia: Secondary | ICD-10-CM

## 2019-11-05 NOTE — Progress Notes (Signed)
°  Chronic Care Management   Outreach Note  11/05/2019 Name: [name] [name] MRN: [id number] DOB: Apr 27, 1955  Referred by: Isaac Bliss, Rayford Halsted, MD Reason for referral : No chief complaint on file.   An unsuccessful telephone outreach was attempted today. The patient was referred to the pharmacist for assistance with care management and care coordination.   Follow Up Plan:   Carley Perdue [employer] Scheduler

## 2019-11-05 NOTE — Telephone Encounter (Signed)
-----   Message from Viona Gilmore, [facility] sent at 11/05/2019 10:32 AM EDT ----- Regarding: CCM referral Good morning!  So sorry for all of the requests but I was contacted by social work today to see if I could get this patient scheduled with me. Would you be able to put in a CCM referral for him?  Thank you so much, I really appreciate it!  Best, Maddie

## 2019-11-06 ENCOUNTER — Telehealth: Payer: Self-pay | Admitting: Internal Medicine

## 2019-11-06 NOTE — Progress Notes (Signed)
  Chronic Care Management   Note  11/06/2019 Name: [name] [name] MRN: [id number] DOB: 05-Sep-1955  [name] [name] is a 64 y.o. year old male who is a primary care patient of Isaac Bliss, Rayford Halsted, MD. I reached out to Brooke Pace by phone today in response to a referral sent by [name] [name], Isaac Bliss, Rayford Halsted, MD.   [name] [name] was given information about Chronic Care Management services today including:  1. CCM service includes personalized support from designated clinical staff supervised by his physician, including individualized plan of care and coordination with other care providers 2. 24/7 contact phone numbers for assistance for urgent and routine care needs. 3. Service will only be billed when office clinical staff spend 20 minutes or more in a month to coordinate care. 4. Only one practitioner may furnish and bill the service in a calendar month. 5. The patient may stop CCM services at any time (effective at the end of the month) by phone call to the office staff.   Patient agreed to services and verbal consent obtained.   Follow up plan:   Carley Perdue [employer] Scheduler

## 2019-11-11 ENCOUNTER — Other Ambulatory Visit: Payer: Self-pay

## 2019-11-11 ENCOUNTER — Encounter: Payer: Self-pay | Admitting: Internal Medicine

## 2019-11-11 ENCOUNTER — Ambulatory Visit (INDEPENDENT_AMBULATORY_CARE_PROVIDER_SITE_OTHER): Payer: HMO | Admitting: Internal Medicine

## 2019-11-11 VITALS — BP 120/70 | HR 84 | Temp 98.8°F | Wt 150.9 lb

## 2019-11-11 DIAGNOSIS — I2510 Atherosclerotic heart disease of native coronary artery without angina pectoris: Secondary | ICD-10-CM

## 2019-11-11 DIAGNOSIS — G8929 Other chronic pain: Secondary | ICD-10-CM | POA: Diagnosis not present

## 2019-11-11 DIAGNOSIS — M25561 Pain in right knee: Secondary | ICD-10-CM

## 2019-11-11 DIAGNOSIS — E1165 Type 2 diabetes mellitus with hyperglycemia: Secondary | ICD-10-CM | POA: Diagnosis not present

## 2019-11-11 DIAGNOSIS — I10 Essential (primary) hypertension: Secondary | ICD-10-CM

## 2019-11-11 DIAGNOSIS — Z9889 Other specified postprocedural states: Secondary | ICD-10-CM | POA: Diagnosis not present

## 2019-11-11 DIAGNOSIS — R296 Repeated falls: Secondary | ICD-10-CM | POA: Diagnosis not present

## 2019-11-11 DIAGNOSIS — Z8679 Personal history of other diseases of the circulatory system: Secondary | ICD-10-CM | POA: Diagnosis not present

## 2019-11-11 LAB — POCT GLYCOSYLATED HEMOGLOBIN (HGB A1C): Hemoglobin A1C: 14.3 % — AB (ref 4.0–5.6)

## 2019-11-11 NOTE — Patient Instructions (Signed)
-  Nice seeing you today!!  -Schedule follow up for your physical in 3 months. Please come in fasting that day.  -Referral to endocrinology has been placed today.

## 2019-11-11 NOTE — Progress Notes (Signed)
Established Patient Office Visit     This visit occurred during the SARS-CoV-2 public health emergency.  Safety protocols were in place, including screening questions prior to the visit, additional usage of staff PPE, and extensive cleaning of exam room while observing appropriate contact time as indicated for disinfecting solutions.    CC/Reason for Visit: Discuss chronic conditions  HPI: [name] [name] is a 64 y.o. male who is coming in today for the above mentioned reasons. Past Medical History is significant for: I have not seen him in over a year.  He has a complex medical history significant for very much uncontrolled diabetes, known medication noncompliance, hypertension, hyperlipidemia, aortic dissection status post repair, major depressive disorder, chronic pain syndrome particularly with issues in the right knee.  He has continued to lose weight and this bothers him.  He has been unhappy with his orthopedic care.  He says he has been seen orthopedics and had an MRI over the summer however none of these records are apparent to me today.  I question his diabetic compliance.  His A1c today is again elevated at 14.3.  He has had 5 falls in the last year.  He has not had routine medical care in years.  I have had a long and frank discussion with him today.  Mainly discussing his uncontrolled diabetes, his lack of follow-up and lack of medication compliance.  He seems more willing to resume routine care.  He seems to have an excuse for everything.  Example: The dietitian he was referred to did not see him and instead sent a "intern", the orthopedist thought that he was shopping for opioids and did not pay attention to his knee pain, he states that he had an MRI of his right knee and received a bill for it but when the orthopedist went to review it with him he was told that he never had the MRI.   Past Medical/Surgical History: Past Medical History:  Diagnosis Date   Anxiety     Aortic dissection (HCC)    Arthritis    Constipation    pt states goes EOD- hard stools    Coronary artery disease    Depression    Diabetes mellitus without complication ([place])    DKA (diabetic ketoacidoses)    Hyperlipidemia    Hypertension    off meds   Psoriasis     Past Surgical History:  Procedure Laterality Date   ANKLE SURGERY     right   CARDIAC CATHETERIZATION  09/25/2001   COLONOSCOPY     ~10 yr ago- normal  per pt    CORONARY ARTERY BYPASS GRAFT  09/30/2001   CABG x 4   LAPAROSCOPIC INGUINAL HERNIA REPAIR Bilateral 02/09/2010   MASS EXCISION Left 03/12/2013   Procedure: LEFT INDEX EXCISION MASS AND DEBRIDMENT DISTAL INTERPHALANGEAL JOINT;  Surgeon: Tennis Must, MD;  Location: [facility];  Service: Orthopedics;  Laterality: Left;    Social History:  reports that he has been smoking cigars. He has never used smokeless tobacco. He reports current alcohol use. He reports that he does not use drugs.  Allergies: No Known Allergies  Family History:  Family History  Problem Relation Age of Onset   Lymphoma Father    Cancer Paternal Grandmother        Colon Cancer   Colon cancer Paternal Grandmother    Colon polyps Neg Hx    Esophageal cancer Neg Hx    Rectal cancer Neg  Hx    Stomach cancer Neg Hx      Current Outpatient Medications:    aspirin 325 MG EC tablet, Take 325 mg by mouth daily., Disp: , Rfl:    atorvastatin (LIPITOR) 40 MG tablet, Take 40 mg by mouth daily., Disp: , Rfl:    B Complex Vitamins (B COMPLEX-B12 PO), Take by mouth daily., Disp: , Rfl:    Continuous Blood Gluc Receiver (FREESTYLE LIBRE 14 DAY READER) DEVI, 1 each by Does not apply route daily., Disp: 1 each, Rfl: 0   Continuous Blood Gluc Sensor (FREESTYLE LIBRE 14 DAY SENSOR) MISC, 1 each by Does not apply route daily., Disp: 2 each, Rfl: 5   Insulin Aspart FlexPen 100 UNIT/ML SOPN, INJECT 16 UNITS INTO THE SKIN THREE TIMES A DAY WITH MEALS, Disp:  15 mL, Rfl: 0   insulin glargine (LANTUS) 100 UNIT/ML injection, Inject 12 Units into the skin daily. Pt uses at bedtime, Disp: , Rfl:    Insulin Pen Needle (DROPLET PEN NEEDLES) 32G X 4 MM MISC, Use daily for glucose control, Disp: 100 each, Rfl: 5   lisinopril (ZESTRIL) 5 MG tablet, TAKE 1 TABLET BY MOUTH DAILY, Disp: 90 tablet, Rfl: 1   PARoxetine (PAXIL) 20 MG tablet, Take 20 mg by mouth daily., Disp: , Rfl:    traMADol (ULTRAM) 50 MG tablet, Take 1 tablet (50 mg total) by mouth 2 (two) times daily as needed., Disp: 30 tablet, Rfl: 0   cholecalciferol (VITAMIN D3) 25 MCG (1000 UT) tablet, Take 1,000 Units by mouth daily. (Patient not taking: Reported on 10/07/2019), Disp: , Rfl:    ELDERBERRY PO, Take 1,250 mg by mouth daily. (Patient not taking: Reported on 10/07/2019), Disp: , Rfl:    gabapentin (NEURONTIN) 300 MG capsule, Take 1 capsule (300 mg total) by mouth at bedtime. (Patient not taking: Reported on 10/07/2019), Disp: 90 capsule, Rfl: 1   metFORMIN (GLUCOPHAGE) 1000 MG tablet, Take 1,000 mg by mouth 2 (two) times daily with a meal. (Patient not taking: Reported on 10/07/2019), Disp: , Rfl:    mirtazapine (REMERON SOL-TAB) 30 MG disintegrating tablet, Take 1 tablet (30 mg total) by mouth at bedtime. (Patient not taking: Reported on 10/07/2019), Disp: 30 tablet, Rfl: 1   Omega 3-6-9 Fatty Acids (OMEGA 3-6-9 PO), Take by mouth daily. (Patient not taking: Reported on 10/07/2019), Disp: , Rfl:   Review of Systems:  Constitutional: Denies fever, chills, diaphoresis, appetite change. HEENT: Denies photophobia, eye pain, redness, hearing loss, ear pain, congestion, sore throat, rhinorrhea, sneezing, mouth sores, trouble swallowing, neck pain, neck stiffness and tinnitus.   Respiratory: Denies SOB, DOE, cough, chest tightness,  and wheezing.   Cardiovascular: Denies chest pain, palpitations and leg swelling.  Gastrointestinal: Denies nausea, vomiting, abdominal pain, diarrhea, constipation,  blood in stool and abdominal distention.  Genitourinary: Denies dysuria, urgency, frequency, hematuria, flank pain and difficulty urinating.  Endocrine: Denies: hot or cold intolerance, sweats, changes in hair or nails, polyuria, polydipsia. Musculoskeletal: Denies myalgias, back pain, joint swelling, arthralgias and gait problem.  Skin: Denies pallor, rash and wound.  Neurological: Denies dizziness, seizures, syncope, weakness, light-headedness and headaches.  Hematological: Denies adenopathy. Easy bruising, personal or family bleeding history  Psychiatric/Behavioral: Denies suicidal ideation, mood changes, confusion, nervousness, sleep disturbance and agitation    Physical Exam: Vitals:   11/11/19 1434  BP: 120/70  Pulse: 84  Temp: 98.8 F (37.1 C)  TempSrc: Oral  SpO2: 97%  Weight: 150 lb 14.4 oz (68.4 kg)    Body  mass index is 19.37 kg/m.   Constitutional: NAD, calm, comfortable Eyes: PERRL, lids and conjunctivae normal, wears corrective lenses ENMT: Mucous membranes are dry.  Respiratory: clear to auscultation bilaterally, no wheezing, no crackles. Normal respiratory effort. No accessory muscle use.  Cardiovascular: Regular rate and rhythm, no murmurs / rubs / gallops. No extremity edema.   Neurologic: Grossly intact and nonfocal.  Psychiatric: Normal judgment and insight. Alert and oriented x 3. Normal mood.    Impression and Plan:  Uncontrolled type 2 diabetes mellitus with hyperglycemia ([place])  -Very uncontrolled with an A1c of 14.3 today. -I believe it is time to refer him to endocrinology.  He has been advised to at least take Lantus 13 units at bedtime plus NovoLog and sliding scale.  He states he has been taking this but his A1c would say differently.  Frequent falls -Strongly suspect diabetic neuropathy contributing to falls, also need to check his thyroid, vitamin B12 levels. -He will return for physical for labs.  Can also consider referral to physical  therapy.  Coronary artery disease involving native coronary artery of native heart without angina pectoris -Should resume cardiology care.  Primary hypertension -Blood pressure is well controlled.  S/P aortic aneurysm repair -Need to check follow-up scheduled with vascular surgery.  Chronic pain of right knee -Nothing for me to do here, he will need follow-up with Ortho.   Patient Instructions  -Nice seeing you today!!  -Schedule follow up for your physical in 3 months. Please come in fasting that day.  -Referral to endocrinology has been placed today.     Lelon Frohlich, MD [facility]

## 2019-11-19 ENCOUNTER — Telehealth: Payer: Self-pay | Admitting: Internal Medicine

## 2019-11-19 NOTE — Telephone Encounter (Signed)
Noted.  Dr Jerilee Hoh is aware.

## 2019-11-19 NOTE — Telephone Encounter (Signed)
[name] [name] from [facility] Team Advance call and stated Pt P H Q-9 result is 5 and he is not suicide and is taken his Paxil ask prescribe, maybe related to his chronic pain . She is requesting a referral to a pain clinic. This is [name] [name] phone # [phone] if you need to call her.

## 2019-11-28 ENCOUNTER — Encounter (HOSPITAL_COMMUNITY): Payer: Self-pay | Admitting: Emergency Medicine

## 2019-11-28 ENCOUNTER — Ambulatory Visit (HOSPITAL_COMMUNITY)
Admission: EM | Admit: 2019-11-28 | Discharge: 2019-11-28 | Disposition: A | Discharge status: Home / Self Care | Payer: HMO | Attending: Psychiatry | Admitting: Psychiatry

## 2019-11-28 ENCOUNTER — Other Ambulatory Visit: Payer: Self-pay

## 2019-11-28 DIAGNOSIS — Z79899 Other long term (current) drug therapy: Secondary | ICD-10-CM | POA: Diagnosis not present

## 2019-11-28 DIAGNOSIS — F322 Major depressive disorder, single episode, severe without psychotic features: Secondary | ICD-10-CM | POA: Diagnosis not present

## 2019-11-28 DIAGNOSIS — F332 Major depressive disorder, recurrent severe without psychotic features: Secondary | ICD-10-CM

## 2019-11-28 DIAGNOSIS — M25561 Pain in right knee: Secondary | ICD-10-CM | POA: Insufficient documentation

## 2019-11-28 DIAGNOSIS — R45851 Suicidal ideations: Secondary | ICD-10-CM | POA: Insufficient documentation

## 2019-11-28 LAB — GLUCOSE, CAPILLARY: Glucose-Capillary: 386 mg/dL — ABNORMAL HIGH (ref 70–99)

## 2019-11-28 NOTE — BH Assessment (Addendum)
Comprehensive Clinical Assessment (CCA) Note  11/28/2019 [name] [name] [id number]  [name] [name] is a 64 year old male who presents to [facility] with thoughts of suicide and a plan to shoot himself. Pt states he told his nurse aide he was having suicidal thoughts and she called the police for a wellness check. Pt was then transported to [facility] by [employer]. Pt reports having access to 3 shotguns that are currently located in his home ("upstairs in the closet"). Pt reports these thoughts were triggered due to losing his retirement accounts to his Music therapist, he lost his fiance because "she didn't want to be with anyone who didn't have money", and chronic knee (right) pain. After further exploration, pt reports his fiance left him "a year ago" and he lost his retirement account "two and a half years ago".   Pt reports he was told he would be getting a new "knee doctor" to help with his chronic knee pain. Pt reports this plan did not follow through as he had hoped and this caused him to be extremely "disappointed in the healthcare system". Pt states he lives in constant pain and suffering.  Pt currently shares a residence with his son [name] [name]: [phone]) who pt reports "he avoids me like the plague". Pt reports poor sleep patterns (2-3 hours of sleep) and poor appetite (pt unable to prepare his own meals). He told this Probation officer that he has lost 45lbs over the last month. Pt shared that he has a nurse aide that comes to his home to assist him with his ADLs. Pt denies HI/AVH. He also denies past/current alcohol/drug use. Pt did not appear to be responding to internal stimuli while speaking with this Probation officer.  Collateral information was obtained from pt's son [name] [name]: [phone]) and pt's sister [name] [name]: [phone]). Pt's son is currently out of the state; however, plans to get an early flight to return back home tonight (11/28/19). He reported that 6 months ago pt told him if the knee surgery  didn't take away the pain he would consider harming himself. Pt's sister reports she will go to pt's home and remove the weapons.   Chief Complaint:  Chief Complaint  Patient presents with  . Suicidal   Visit Diagnosis: Major Depressive Disorder, recurrent, severe    CCA Screening, Triage and Referral (STR)  Patient Reported Information How did you hear about [place]? Other (Comment) ([employer])  Referral name: No data recorded Referral phone number: No data recorded  Whom do you see for routine medical problems? No data recorded Practice/Facility Name: No data recorded Practice/Facility Phone Number: No data recorded Name of Contact: No data recorded Contact Number: No data recorded Contact Fax Number: No data recorded Prescriber Name: No data recorded Prescriber Address (if known): No data recorded  What Is the Reason for Your Visit/Call Today? No data recorded How Long Has This Been Causing You Problems? > than 6 months  What Do You Feel Would Help You the Most Today? Assessment Only   Have You Recently Been in Any Inpatient Treatment (Hospital/Detox/Crisis Center/28-Day Program)? No  Name/Location of Program/Hospital:No data recorded How Long Were You There? No data recorded When Were You Discharged? No data recorded  Have You Ever Received Services From [facility] Before? No  Who Do You See at [facility]? No data recorded  Have You Recently Had Any Thoughts About Hurting Yourself? Yes  Are You Planning to Commit Suicide/Harm Yourself At This time? Yes   Have you Recently Had Thoughts About Hurting  Someone [name] [name]? No  Explanation: No data recorded  Have You Used Any Alcohol or Drugs in the Past 24 Hours? No  How Long Ago Did You Use Drugs or Alcohol? No data recorded What Did You Use and How Much? No data recorded  Do You Currently Have a Therapist/Psychiatrist? No  Name of Therapist/Psychiatrist: No data recorded  Have You Been Recently Discharged From Any  Office Practice or Programs? No  Explanation of Discharge From Practice/Program: No data recorded    CCA Screening Triage Referral Assessment Type of Contact: Face-to-Face  Is this Initial or Reassessment? No data recorded Date Telepsych consult ordered in [facility]:  No data recorded Time Telepsych consult ordered in [facility]:  No data recorded  Patient Reported Information Reviewed? Yes  Patient Left Without Being Seen? No data recorded Reason for Not Completing Assessment: No data recorded  Collateral Involvement: No data recorded  Does Patient Have a [place]? No data recorded Name and Contact of Legal Guardian: No data recorded If Minor and Not Living with Parent(s), Who has Custody? No data recorded Is CPS involved or ever been involved? Never  Is APS involved or ever been involved? Never   Patient Determined To Be At Risk for Harm To Self or Others Based on Review of Patient Reported Information or Presenting Complaint? No  Method: No data recorded Availability of Means: No data recorded Intent: No data recorded Notification Required: No data recorded Additional Information for Danger to Others Potential: No data recorded Additional Comments for Danger to Others Potential: No data recorded Are There Guns or Other Weapons in Your Home? No data recorded Types of Guns/Weapons: No data recorded Are These Weapons Safely Secured?                            No data recorded Who Could Verify You Are Able To Have These Secured: No data recorded Do You Have any Outstanding Charges, Pending Court Dates, Parole/Probation? No data recorded Contacted To Inform of Risk of Harm To Self or Others: No data recorded  Location of Assessment: [facility]   Does Patient Present under Involuntary Commitment? No  IVC Papers Initial File Date: No data recorded  [place] of Residence: [place]   Patient Currently Receiving the Following Services: Not Receiving  Services   Determination of Need: Emergent (2 hours)   Options For Referral: Outpatient Therapy     CCA Biopsychosocial Intake/Chief Complaint:  Suicidal  Current Symptoms/Problems: Suicidal thoughts   Patient Reported Schizophrenia/Schizoaffective Diagnosis in Past: No   Strengths: No data recorded Preferences: No data recorded Abilities: No data recorded  Type of Services Patient Feels are Needed: No data recorded  Initial Clinical Notes/Concerns: No data recorded  Mental Health Symptoms Depression:  Change in energy/activity;Irritability;Sleep (too much or little);Weight gain/loss;Hopelessness;Worthlessness;Increase/decrease in appetite;Difficulty Concentrating;Fatigue   Duration of Depressive symptoms: Greater than two weeks   Mania:  N/A   Anxiety:   N/A   Psychosis:  None   Duration of Psychotic symptoms: No data recorded  Trauma:  N/A   Obsessions:  N/A   Compulsions:  N/A   Inattention:  N/A   Hyperactivity/Impulsivity:  N/A   Oppositional/Defiant Behaviors:  N/A   Emotional Irregularity:  N/A   Other Mood/Personality Symptoms:  None    Mental Status Exam Appearance and self-care  Stature:  Tall   Weight:  Thin   Clothing:  Age-appropriate   Grooming:  Normal  Cosmetic use:  None   Posture/gait:  Stooped;Slumped   Motor activity:  Slowed (Pt walks with a walker)   Sensorium  Attention:  Normal   Concentration:  Normal   Orientation:  X5   Recall/memory:  Normal   Affect and Mood  Affect:  Flat   Mood:  Depressed   Relating  Eye contact:  Fleeting   Facial expression:  Depressed   Attitude toward examiner:  Cooperative   Thought and Language  Speech flow: Clear and Coherent   Thought content:  Appropriate to Mood and Circumstances   Preoccupation:  None   Hallucinations:  None   Organization:  No data recorded  [employer] of Knowledge:  Fair   Intelligence:  Average   Abstraction:   Normal   Judgement:  Poor   Reality Testing:  Adequate   Insight:  Poor   Decision Making:  Vacilates   Social Functioning  Social Maturity:  Isolates   Social Judgement:  No data recorded  Stress  Stressors:  Family conflict;Financial   Coping Ability:  Exhausted   Skill Deficits:  None   Supports:  Family     Religion:    Leisure/Recreation:    Exercise/Diet: Exercise/Diet Have You Gained or Lost A Significant Amount of Weight in the Past Six Months?: Yes-Lost Number of Pounds Lost?: 45 (loss 45lbs in 1 month) Do You Have Any Trouble Sleeping?: Yes Explanation of Sleeping Difficulties: Getting 2-3 hrs of sleep   CCA Employment/Education Employment/Work Situation:    Education:     CCA Family/Childhood History Family and Relationship History: Family history Marital status: Single Does patient have children?: Yes How many children?: 4 How is patient's relationship with their children?: 2 sons & 2 daughters - Pt reports having a strained relationship with his 2 sons and his 2 daughters he talks to regularly.  Childhood History:     Child/Adolescent Assessment: N/A     CCA Substance Use Alcohol/Drug Use: Alcohol / Drug Use Pain Medications: See MAR Prescriptions: See MAR Over the Counter: See MAR History of alcohol / drug use?: No history of alcohol / drug abuse     Recommendations for Services/Supports/Treatments: Recommendations for Services/Supports/Treatments Recommendations For Services/Supports/Treatments: Individual Therapy  DSM5 Diagnoses: Patient Active Problem List   Diagnosis Date Noted  . Aortic dissection ([place]) 02/05/2019  . Coronary artery disease 02/05/2019  . Hyperlipidemia 02/05/2019  . Hypertension 02/05/2019  . Psoriasis 02/05/2019  . Vitamin D deficiency 01/07/2019  . Uncontrolled type 2 diabetes mellitus with hyperglycemia ([place]) 01/07/2019  . Microalbuminuria 01/07/2019  . Depression 06/10/2018  . DKA (diabetic  ketoacidoses) 06/10/2018  . Non-intractable vomiting 06/10/2018  . Chronic pain of right knee 05/15/2018  . Patellar tendonitis of right knee 04/24/2018  . Ulnar neuropathy of left upper extremity 07/12/2017  . Pain in finger of left hand 07/12/2017  . Vertigo 03/07/2016  . Peyronie's disease 06/09/2015  . Erectile dysfunction 03/15/2015  . Right groin pain 01/20/2013  . S/P aortic aneurysm repair 05/19/2012  . S/P CABG (coronary artery bypass graft) 05/19/2012  . Non-proliferative diabetic retinopathy ([place]) 02/27/2011  . Contracture of hand joint 11/01/2010  . Dupuytren's contracture of both hands 11/01/2010    Allyne Gee, Counselor
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
## 2019-11-28 NOTE — Discharge Instructions (Addendum)

## 2019-11-28 NOTE — ED Provider Notes (Signed)
Behavioral Health Urgent Care Medical Screening Exam  Patient Name: [name] [name] MRN: [id number] Date of Evaluation: 11/28/19 Chief Complaint: Chief Complaint/Presenting Problem: Suicidal Diagnosis:  Final diagnoses:  Current severe episode of major depressive disorder without psychotic features without prior episode ([place])    History of Present illness: [name] [name] is a 64 y.o. male.  Patient presents voluntarily to [facility] behavioral health urgent care for walk-in assessment.  Patient reports he telephoned his nurse concierge today who telephoned police to bring him into urgent care for assessment.  Patient endorses recent stressors include right knee pain x2 years, patient reports right knee pain times approximately 2 years, patient reports loss of 45 pounds over 2 years.  Patient reports decreased appetite.  X2 years.  Patient reports this weekend he has been alone in home as his son is out of town currently.  Patient reports he also has no confidence in his primary care physician as they "do not follow through with suggestions."  Patient reports he is currently awaiting primary care to assist him with delivery of meals as well as with alternative pain clinic.  Patient reports frustration with failure to  return phone calls to primary care provider's office.  Patient assessed by nurse practitioner.  Patient alert and oriented, answers appropriately.  Patient pleasant cooperative during assessment.  Patient endorses suicidal thoughts "off and on for a while."  Patient denies suicidal thoughts currently.  Patient denies any plan or intent to harm self.  Patient denies any history of suicide attempts, denies any history of self-harm behaviors.  Patient contracts verbally for safety with this Probation officer.  Patient denies homicidal ideations.  Patient denies both auditory and visual hallucinations.  There is no evidence of delusional thought content and no indication that the  patient is responding to internal stimuli.  Patient denies symptoms of paranoia.  Patient reports he does not currently have outpatient psychiatric providers.  Patient reports he did see outpatient counseling, briefly, after a divorce 21 years ago.  Patient agrees with plan to follow-up with outpatient psychiatry and counseling.  Patient is currently prescribed Paxil 20 mg daily by primary care provider, patient reports compliance with this medication.  Patient has been diagnosed with depression in the past.  Patient resides in [place] with his adult son.  Patient agrees to allow sister to remove weapons from the home.  Patient is retired x 1.5 years.  Patient denies alcohol and substance use.  Patient gives verbal consent to speak with sister, [name] phone number [phone].  Spoke with patient's sister who has removed all weapons from patient's home today.  Patient sister reports she lives 1 mile from patient and speaks to him approximately twice per week.  Patient sister reports patient's adult son, of the home, is currently out of town and was due back Monday but has updated his travel arrangements to return home by 11 PM tonight.  Per patient's sister patient also speaks with his mother every day typically but she is currently out of town as well.   Safety planning completed with patient's sister.  All weapons removed from patient's home. Patient sister reports that she will pick up patient and have him remain with her in her home until patient's son can pick him up from her home later.  Psychiatric Specialty Exam  Presentation  General Appearance:Appropriate for Environment;Casual  Eye Contact:Fair  Speech:Clear and Coherent;Normal Rate  Speech Volume:Normal  Handedness:Right   Mood and Affect  Mood:Depressed  Affect:Appropriate;Congruent  Thought Process  Thought Processes:Coherent;Goal Directed  Descriptions of Associations:Intact  Orientation:Full (Time, Place and  Person)  Thought Content:Logical;WDL  Hallucinations:None  Ideas of Reference:None  Suicidal Thoughts:Yes, Passive Without Intent;Without Plan  Homicidal Thoughts:No   Sensorium  Memory:Immediate Good;Recent Good;Remote Good  Judgment:Good  Insight:Good   Executive Functions  Concentration:Good  Attention Span:Good  [place]  Language:Good   Psychomotor Activity  Psychomotor Activity:Normal   Assets  Assets:Communication Skills;Desire for Improvement;Financial Resources/Insurance;Housing;Intimacy;Leisure Time;Resilience;Social Support   Sleep  Sleep:Fair  Number of hours: No data recorded  Physical Exam: Physical Exam Vitals and nursing note reviewed.  Constitutional:      Appearance: He is well-developed.  HENT:     Head: Normocephalic.     Nose: Nose normal.  Cardiovascular:     Rate and Rhythm: Normal rate.  Pulmonary:     Effort: Pulmonary effort is normal.  Neurological:     Mental Status: He is alert and oriented to person, place, and time.  Psychiatric:        Attention and Perception: Attention and perception normal.        Mood and Affect: Affect normal. Mood is depressed.        Speech: Speech normal.        Behavior: Behavior normal. Behavior is cooperative.        Thought Content: Thought content normal.        Cognition and Memory: Cognition and memory normal.        Judgment: Judgment normal.    Review of Systems  Constitutional: Negative.   HENT: Negative.   Eyes: Negative.   Respiratory: Negative.   Cardiovascular: Negative.   Gastrointestinal: Negative.   Genitourinary: Negative.   Musculoskeletal: Negative.   Skin: Negative.   Neurological: Negative.   Endo/Heme/Allergies: Negative.   Psychiatric/Behavioral: Positive for depression.   Blood pressure 111/63, pulse 74, temperature 98 F (36.7 C), temperature source Oral, resp. rate 18, height 6\' 2"  (1.88 m), weight 147 lb (66.7 kg), SpO2  99 %. Body mass index is 18.87 kg/m.  Musculoskeletal: Strength & Muscle Tone: decreased Gait & Station: unsteady Patient leans: N/A   [place] MSE Discharge Disposition for Follow up and Recommendations: Based on my evaluation the patient does not appear to have an emergency medical condition and can be discharged with resources and follow up care in outpatient services for Medication Management and Individual Therapy  Patient reviewed with Dr. Hampton Abbot.   Emmaline Kluver, FNP 11/28/2019, 4:44 PM

## 2019-11-28 NOTE — ED Notes (Signed)
Pt belongings in locker #29. 

## 2019-11-29 ENCOUNTER — Other Ambulatory Visit: Payer: Self-pay

## 2019-11-29 ENCOUNTER — Emergency Department (HOSPITAL_COMMUNITY)
Admission: EM | Admit: 2019-11-29 | Discharge: 2019-11-29 | Disposition: A | Discharge status: Home / Self Care | Payer: HMO | Attending: Emergency Medicine | Admitting: Emergency Medicine

## 2019-11-29 ENCOUNTER — Encounter (HOSPITAL_COMMUNITY): Payer: Self-pay | Admitting: Emergency Medicine

## 2019-11-29 DIAGNOSIS — Z951 Presence of aortocoronary bypass graft: Secondary | ICD-10-CM | POA: Diagnosis not present

## 2019-11-29 DIAGNOSIS — R1310 Dysphagia, unspecified: Secondary | ICD-10-CM | POA: Diagnosis present

## 2019-11-29 DIAGNOSIS — Z79899 Other long term (current) drug therapy: Secondary | ICD-10-CM | POA: Insufficient documentation

## 2019-11-29 DIAGNOSIS — F17290 Nicotine dependence, other tobacco product, uncomplicated: Secondary | ICD-10-CM | POA: Diagnosis not present

## 2019-11-29 DIAGNOSIS — I2510 Atherosclerotic heart disease of native coronary artery without angina pectoris: Secondary | ICD-10-CM | POA: Insufficient documentation

## 2019-11-29 DIAGNOSIS — I119 Hypertensive heart disease without heart failure: Secondary | ICD-10-CM | POA: Insufficient documentation

## 2019-11-29 DIAGNOSIS — J029 Acute pharyngitis, unspecified: Secondary | ICD-10-CM | POA: Insufficient documentation

## 2019-11-29 DIAGNOSIS — R1312 Dysphagia, oropharyngeal phase: Secondary | ICD-10-CM | POA: Diagnosis not present

## 2019-11-29 DIAGNOSIS — H9202 Otalgia, left ear: Secondary | ICD-10-CM | POA: Diagnosis not present

## 2019-11-29 DIAGNOSIS — Z794 Long term (current) use of insulin: Secondary | ICD-10-CM | POA: Insufficient documentation

## 2019-11-29 DIAGNOSIS — E119 Type 2 diabetes mellitus without complications: Secondary | ICD-10-CM | POA: Diagnosis not present

## 2019-11-29 MED ORDER — DEXAMETHASONE 4 MG PO TABS
4.0000 mg | ORAL_TABLET | Freq: Once | ORAL | Status: AC
  Administered 2019-11-29: 4 mg via ORAL
  Filled 2019-11-29: qty 1

## 2019-11-29 NOTE — ED Triage Notes (Signed)
C/o L ear pain and painful to swallow since yesterday.  Taking ibuprofen.

## 2019-11-29 NOTE — Discharge Instructions (Signed)
Please follow-up with your primary care provider for ongoing evaluation and management of your sore throat and left-sided ear discomfort.  Your physical exam today was reassuring.  Do not feel as though imaging or lab work is warranted.  Please continue take your temperature regularly and take Tylenol or ibuprofen as needed for fever control.  You were treated with a single dose of Decadron here in the ED.  I would continue with your NSAIDs as that has had improvement of your symptoms at home.  Return to the ED or seek immediate medical attention should you experience any new or worsening symptoms.

## 2019-11-29 NOTE — ED Provider Notes (Signed)
[facility] EMERGENCY DEPARTMENT Provider Note   CSN: [id number] Arrival date & time: 11/29/19  1455     History Chief Complaint  Patient presents with   Otalgia   Sore Throat    [name] [name] is a 64 y.o. male with PMH of IDT2DM, HLD, and HTN who presents the ED with complaints of left ear pain and dysphagia.  On my examination, patient reports that for the past 48 hours she has had mild left-sided neck swelling and pain with swallowing.  He states that eating tolerating secretions is mildly difficult for him.  Patient has been managing his symptoms with 100 mg ibuprofen with some relief.  However, this morning he felt as though the ibuprofen did not improve his discomfort and dysphagia.  He denies any drooling or trismus.  No fevers or chills.  No obvious dental source.  He also is endorsing left-sided ear discomfort.  He states that it feels to be in the middle ear and denies any tenderness on external ear.  Patient denies any meningismus, cough, headache, dizziness, hearing change, ear discharge, facial swelling, or other symptoms.  HPI     Past Medical History:  Diagnosis Date   Anxiety    Aortic dissection (HCC)    Arthritis    Constipation    pt states goes EOD- hard stools    Coronary artery disease    Depression    Diabetes mellitus without complication ([place])    DKA (diabetic ketoacidoses)    Hyperlipidemia    Hypertension    off meds   Psoriasis     Patient Active Problem List   Diagnosis Date Noted   Aortic dissection ([place]) 02/05/2019   Coronary artery disease 02/05/2019   Hyperlipidemia 02/05/2019   Hypertension 02/05/2019   Psoriasis 02/05/2019   Vitamin D deficiency 01/07/2019   Uncontrolled type 2 diabetes mellitus with hyperglycemia ([place]) 01/07/2019   Microalbuminuria 01/07/2019   Depression 06/10/2018   DKA (diabetic ketoacidoses) 06/10/2018   Non-intractable vomiting 06/10/2018   Chronic pain of  right knee 05/15/2018   Patellar tendonitis of right knee 04/24/2018   Ulnar neuropathy of left upper extremity 07/12/2017   Pain in finger of left hand 07/12/2017   Vertigo 03/07/2016   Peyronie's disease 06/09/2015   Erectile dysfunction 03/15/2015   Right groin pain 01/20/2013   S/P aortic aneurysm repair 05/19/2012   S/P CABG (coronary artery bypass graft) 05/19/2012   Non-proliferative diabetic retinopathy ([place]) 02/27/2011   Contracture of hand joint 11/01/2010   Dupuytren's contracture of both hands 11/01/2010    Past Surgical History:  Procedure Laterality Date   ANKLE SURGERY     right   CARDIAC CATHETERIZATION  09/25/2001   COLONOSCOPY     ~10 yr ago- normal  per pt    CORONARY ARTERY BYPASS GRAFT  09/30/2001   CABG x 4   LAPAROSCOPIC INGUINAL HERNIA REPAIR Bilateral 02/09/2010   MASS EXCISION Left 03/12/2013   Procedure: LEFT INDEX EXCISION MASS AND DEBRIDMENT DISTAL INTERPHALANGEAL JOINT;  Surgeon: Tennis Must, MD;  Location: [place];  Service: Orthopedics;  Laterality: Left;       Family History  Problem Relation Age of Onset   Lymphoma Father    Cancer Paternal Grandmother        Colon Cancer   Colon cancer Paternal Grandmother    Colon polyps Neg Hx    Esophageal cancer Neg Hx    Rectal cancer Neg Hx    Stomach cancer  Neg Hx     Social History   Tobacco Use   Smoking status: Current Some Day Smoker    Types: Cigars   Smokeless tobacco: Never Used  Vaping Use   Vaping Use: Never used  Substance Use Topics   Alcohol use: Yes    Comment: daily beer or scotch    Drug use: No    Home Medications Prior to Admission medications   Medication Sig Start Date End Date Taking? Authorizing Provider  aspirin 325 MG EC tablet Take 325 mg by mouth daily.    [provider]  atorvastatin (LIPITOR) 40 MG tablet Take 40 mg by mouth daily.    [provider]  B Complex Vitamins (B COMPLEX-B12 PO)  Take by mouth daily.    [provider]  cholecalciferol (VITAMIN D3) 25 MCG (1000 UT) tablet Take 1,000 Units by mouth daily. Patient not taking: Reported on 10/07/2019    [provider]  Continuous Blood Gluc Receiver (FREESTYLE LIBRE 14 DAY READER) DEVI 1 each by Does not apply route daily. 02/03/19   Isaac Bliss, Rayford Halsted, MD  Continuous Blood Gluc Sensor (FREESTYLE LIBRE 14 DAY SENSOR) MISC 1 each by Does not apply route daily. 02/03/19   Isaac Bliss, Rayford Halsted, MD  gabapentin (NEURONTIN) 300 MG capsule Take 1 capsule (300 mg total) by mouth at bedtime. Patient not taking: Reported on 10/07/2019 01/14/19   Isaac Bliss, Rayford Halsted, MD  Insulin Aspart FlexPen 100 UNIT/ML SOPN INJECT 16 UNITS INTO THE SKIN THREE TIMES A DAY WITH MEALS 10/08/19   Isaac Bliss, Rayford Halsted, MD  insulin glargine (LANTUS) 100 UNIT/ML injection Inject 12 Units into the skin daily. Pt uses at bedtime    [provider]  Insulin Pen Needle (DROPLET PEN NEEDLES) 32G X 4 MM MISC Use daily for glucose control 01/14/19   Isaac Bliss, Rayford Halsted, MD  lisinopril (ZESTRIL) 5 MG tablet TAKE 1 TABLET BY MOUTH DAILY 08/04/19   Isaac Bliss, Rayford Halsted, MD  metFORMIN (GLUCOPHAGE) 1000 MG tablet Take 1,000 mg by mouth 2 (two) times daily with a meal. Patient not taking: Reported on 10/07/2019    [provider]  PARoxetine (PAXIL) 20 MG tablet Take 20 mg by mouth daily.    [provider]  traMADol (ULTRAM) 50 MG tablet Take 1 tablet (50 mg total) by mouth 2 (two) times daily as needed. 04/08/19   Aundra Dubin, PA-C    Allergies    Patient has no known allergies.  Review of Systems   Review of Systems  All other systems reviewed and are negative.   Physical Exam Updated Vital Signs BP 96/66 (BP Location: Left Arm)    Pulse 76    Temp 98.1 F (36.7 C) (Oral)    Resp 18    SpO2 98%   Physical Exam Vitals and nursing note reviewed. Exam conducted with a chaperone  present.  Constitutional:      General: He is not in acute distress.    Appearance: Normal appearance. He is not toxic-appearing.  HENT:     Head: Normocephalic and atraumatic.     Comments: No facial swelling.    Right Ear: Tympanic membrane, ear canal and external ear normal. There is no impacted cerumen.     Left Ear: Tympanic membrane and ear canal normal. There is no impacted cerumen.     Ears:     Comments: Left ear appears normal.  TM visualized.  No bulging or erythema.  Mouth/Throat:     Comments: Patent oropharynx.  Tolerating secretions.  No trismus.  No uvular deviation.  No exudates noted.  Moderate dental disease diffusely.  No floor of mouth swelling, soft.  No obvious masses or asymmetries noted. Eyes:     General: No scleral icterus.    Conjunctiva/sclera: Conjunctivae normal.  Neck:     Comments: No meningismus.  Adenopathy noted. Cardiovascular:     Rate and Rhythm: Normal rate and regular rhythm.     Pulses: Normal pulses.     Heart sounds: Normal heart sounds.  Pulmonary:     Effort: Pulmonary effort is normal. No respiratory distress.     Breath sounds: Normal breath sounds.  Musculoskeletal:     Cervical back: Normal range of motion and neck supple. No rigidity.  Lymphadenopathy:     Cervical: Cervical adenopathy present.  Skin:    General: Skin is dry.  Neurological:     Mental Status: He is alert.     GCS: GCS eye subscore is 4. GCS verbal subscore is 5. GCS motor subscore is 6.  Psychiatric:        Mood and Affect: Mood normal.        Behavior: Behavior normal.        Thought Content: Thought content normal.     ED Results / Procedures / Treatments   Labs (all labs ordered are listed, but only abnormal results are displayed) Labs Reviewed - No data to display  EKG None  Radiology No results found.  Procedures Procedures (including critical care time)  Medications Ordered in ED Medications  dexamethasone (DECADRON) tablet 4 mg (4 mg  Oral Given 11/29/19 1809)    ED Course  I have reviewed the triage vital signs and the nursing notes.  Pertinent labs & imaging results that were available during my care of the patient were reviewed by me and considered in my medical decision making (see chart for details).    MDM Rules/Calculators/A&P                          I suspect patient will benefit from a single dose of Decadron here in the ED to help with his sore throat and inflammation symptoms.  Patient was super eager to leave to go home and eat.  He denies any inability to eat or drink.  No history of liver disease.  Lower suspicion for esophageal varices.  He also denies any significant history of reflux disease otherwise concerning for esophageal strictures.  He was able to take the oral medication here in the ED.  Patient able to speak in complete sentences without difficulty swallowing or trouble handling secretions.  No stridor, wheezing, tripoding, grey pseudomembrane, trismus, uvular deviation, sublingual/submandibular/submental swelling or induration, nuchal rigidity, or neck pain.  Low suspicion for deep tissue infection such as PTA or RPA, epiglottitis, vertebral dissection, meningitis, or other emergent pathology.    Discussed conservative therapy such as warm tea with honey, Chloraseptic spray, throat lozenges, and salt-water gargles.  Encouraged NSAIDs and emphasized importance of oral hydration and antipyretics as needed for fever control.    Patient will follow-up with primary care provider for ongoing evaluation and management.  Strict ED return precautions discussed.  Patient voices understanding and is agreeable to the plan.   Final Clinical Impression(s) / ED Diagnoses Final diagnoses:  Oropharyngeal dysphagia  Discomfort of left ear    Rx / DC Orders ED Discharge Orders  None       Reita Chard 11/29/19 1813    Noemi Chapel, MD 11/30/19 [phone]

## 2019-11-29 NOTE — ED Provider Notes (Signed)
  Face-to-face evaluation   History: He presents for evaluation of painful left ear and difficulty swallowing since yesterday.  He denies fever, chills or vomiting.  Physical exam: Alert elderly male with normal phonation.  No trismus.  Oropharynx appears normal.  No tonsillar hypertrophy or exudate.  Left pinna and tragus appear normal.  No pain with traction on the left pinna.  No respiratory distress.  Medical screening examination/treatment/procedure(s) were conducted as a shared visit with non-physician practitioner(s) and myself.  I personally evaluated the patient during the encounter   Daleen Bo, MD 11/30/19 1032

## 2019-11-30 ENCOUNTER — Telehealth: Payer: Self-pay | Admitting: Internal Medicine

## 2019-11-30 NOTE — Telephone Encounter (Signed)
Pt is calling in stating that he went to the ER over the weekend and they gave him a pill that helped him swallow and he was told if he call his PCP they would be able to give him that pill.  Pt was very upset that he was not able to get the pill today due to Dr. Jerilee Hoh not being in the office and no available appointments today.  We got disconnected so I gave the pt a call back he he stated that he hung up because he is not able to get that pill called in to the pharmacy without being seen.

## 2019-12-01 ENCOUNTER — Telehealth: Payer: HMO | Admitting: Family Medicine

## 2019-12-01 DIAGNOSIS — J029 Acute pharyngitis, unspecified: Secondary | ICD-10-CM

## 2019-12-01 DIAGNOSIS — H9209 Otalgia, unspecified ear: Secondary | ICD-10-CM

## 2019-12-01 DIAGNOSIS — R1310 Dysphagia, unspecified: Secondary | ICD-10-CM

## 2019-12-01 NOTE — Telephone Encounter (Signed)
Patient has scheduled a virtual visit with Dr Maudie Mercury 12/01/19.

## 2019-12-01 NOTE — Telephone Encounter (Signed)
I do not see documentation of any meds given in ED. But he would need an OV if he would like to discuss a new prescription.

## 2019-12-01 NOTE — Progress Notes (Signed)
Virtual Visit via Telephone Note  I connected with Brooke Pace on 12/01/19 at  5:20 PM EST by telephone and verified that I am speaking with the correct person using two identifiers.   I discussed the limitations, risks, security and privacy concerns of performing an evaluation and management service by telephone and the availability of in person appointments. I also discussed with the patient that there may be a patient responsible charge related to this service. The patient expressed understanding and agreed to proceed.  Location patient: home, [place] Location provider: work or home office Participants present for the call: patient, provider, son Patient did not have a visit with me in the prior 7 days to address this/these issue(s).   History of Present Illness:  Acute telemedicine visit for : -Onset: 2 days ago  -Symptoms include: sore throat, L ear pain, "throat is swollen closed", inability to eat - reports is unable to swallow -son reports has not eaten anything in 2 days and is having difficulty swallowing his saliva -they went to the ER the day this started, but report it was a very minimal exam, no testing was done and he was given a steroid. Reports felt immediate relief with steroid, but then symptoms rapidly returned -Denies: fevers, cough, CP, SOB -Pertinent past medical history:depression - recently off suicide watch per son    Observations/Objective: Patient sounds frustrated on the phone I do not appreciate any SOB. Speech and thought processing are grossly intact. Patient reported vitals:  Assessment and Plan:  Dysphagia, unspecified type  Sore throat  Otalgia, unspecified laterality  -we discussed possible serious and likely etiologies, options for evaluation and workup, limitations of telemedicine visit vs in person visit, treatment, treatment risks and precautions. Pt prefers to treat via telemedicine empirically rather than in person at this moment.   Concerning symptoms of dysphagia to the point that he has not been able to eat in 2 days and son reports difficulty swallowing even his own saliva.  Advised he definitely needs another more thorough in person evaluation and discussed options and advised to see evaluation today.  He likely needs at minimum Covid, strep testing, thorough exam and possibly imaging, possible transport for admission pending findings.  They are very frustrated that he was not able to get in with his PCP office.  Suggested possibly [facility] ER versus an urgent care away from the hospital vs other that may be able to spend more time doing work-up and exam.  They refuse to go back to the ER at [facility]. They were not able to connect over video.  Recommended that they seek in person care tonight.  Son was in agreement and is considering options.  We will not charge for this visit.  I discussed the assessment and treatment plan with the patient. The patient was provided an opportunity to ask questions and all were answered. The patient agreed with the plan and demonstrated an understanding of the instructions.   I spent 18 minutes on this encounter.   Lucretia Kern, DO

## 2019-12-02 ENCOUNTER — Other Ambulatory Visit: Payer: Self-pay

## 2019-12-02 DIAGNOSIS — Z03818 Encounter for observation for suspected exposure to other biological agents ruled out: Secondary | ICD-10-CM | POA: Diagnosis not present

## 2019-12-02 DIAGNOSIS — Z20822 Contact with and (suspected) exposure to covid-19: Secondary | ICD-10-CM | POA: Diagnosis not present

## 2019-12-03 ENCOUNTER — Encounter (HOSPITAL_COMMUNITY): Payer: Self-pay | Admitting: Emergency Medicine

## 2019-12-03 ENCOUNTER — Telehealth: Payer: Self-pay | Admitting: Internal Medicine

## 2019-12-03 ENCOUNTER — Emergency Department (HOSPITAL_COMMUNITY): Payer: HMO

## 2019-12-03 ENCOUNTER — Emergency Department (HOSPITAL_COMMUNITY)
Admission: EM | Admit: 2019-12-03 | Discharge: 2019-12-09 | Disposition: A | Discharge status: Home / Self Care | Payer: HMO | Attending: Emergency Medicine | Admitting: Emergency Medicine

## 2019-12-03 ENCOUNTER — Telehealth: Payer: Self-pay

## 2019-12-03 ENCOUNTER — Other Ambulatory Visit: Payer: Self-pay

## 2019-12-03 ENCOUNTER — Ambulatory Visit (INDEPENDENT_AMBULATORY_CARE_PROVIDER_SITE_OTHER): Payer: HMO | Admitting: Internal Medicine

## 2019-12-03 ENCOUNTER — Encounter: Payer: Self-pay | Admitting: Internal Medicine

## 2019-12-03 VITALS — BP 112/60 | HR 74 | Temp 98.2°F | Ht 74.0 in | Wt 146.7 lb

## 2019-12-03 DIAGNOSIS — Z79899 Other long term (current) drug therapy: Secondary | ICD-10-CM | POA: Diagnosis not present

## 2019-12-03 DIAGNOSIS — J029 Acute pharyngitis, unspecified: Secondary | ICD-10-CM

## 2019-12-03 DIAGNOSIS — I2510 Atherosclerotic heart disease of native coronary artery without angina pectoris: Secondary | ICD-10-CM | POA: Diagnosis not present

## 2019-12-03 DIAGNOSIS — Z794 Long term (current) use of insulin: Secondary | ICD-10-CM | POA: Diagnosis not present

## 2019-12-03 DIAGNOSIS — B9789 Other viral agents as the cause of diseases classified elsewhere: Secondary | ICD-10-CM | POA: Insufficient documentation

## 2019-12-03 DIAGNOSIS — T1491XA Suicide attempt, initial encounter: Secondary | ICD-10-CM

## 2019-12-03 DIAGNOSIS — Z951 Presence of aortocoronary bypass graft: Secondary | ICD-10-CM | POA: Insufficient documentation

## 2019-12-03 DIAGNOSIS — R45851 Suicidal ideations: Secondary | ICD-10-CM | POA: Diagnosis not present

## 2019-12-03 DIAGNOSIS — F329 Major depressive disorder, single episode, unspecified: Secondary | ICD-10-CM | POA: Insufficient documentation

## 2019-12-03 DIAGNOSIS — F17290 Nicotine dependence, other tobacco product, uncomplicated: Secondary | ICD-10-CM | POA: Diagnosis not present

## 2019-12-03 DIAGNOSIS — J028 Acute pharyngitis due to other specified organisms: Secondary | ICD-10-CM | POA: Insufficient documentation

## 2019-12-03 DIAGNOSIS — F332 Major depressive disorder, recurrent severe without psychotic features: Secondary | ICD-10-CM | POA: Diagnosis not present

## 2019-12-03 DIAGNOSIS — H66002 Acute suppurative otitis media without spontaneous rupture of ear drum, left ear: Secondary | ICD-10-CM

## 2019-12-03 DIAGNOSIS — I10 Essential (primary) hypertension: Secondary | ICD-10-CM | POA: Diagnosis not present

## 2019-12-03 DIAGNOSIS — E1110 Type 2 diabetes mellitus with ketoacidosis without coma: Secondary | ICD-10-CM | POA: Diagnosis not present

## 2019-12-03 DIAGNOSIS — E1165 Type 2 diabetes mellitus with hyperglycemia: Secondary | ICD-10-CM

## 2019-12-03 DIAGNOSIS — I509 Heart failure, unspecified: Secondary | ICD-10-CM | POA: Diagnosis not present

## 2019-12-03 DIAGNOSIS — Z20822 Contact with and (suspected) exposure to covid-19: Secondary | ICD-10-CM | POA: Diagnosis not present

## 2019-12-03 DIAGNOSIS — R1310 Dysphagia, unspecified: Secondary | ICD-10-CM | POA: Diagnosis not present

## 2019-12-03 DIAGNOSIS — I4892 Unspecified atrial flutter: Secondary | ICD-10-CM | POA: Diagnosis not present

## 2019-12-03 DIAGNOSIS — Z7982 Long term (current) use of aspirin: Secondary | ICD-10-CM | POA: Diagnosis not present

## 2019-12-03 DIAGNOSIS — H9202 Otalgia, left ear: Secondary | ICD-10-CM | POA: Diagnosis present

## 2019-12-03 LAB — CBC WITH DIFFERENTIAL/PLATELET
Abs Immature Granulocytes: 0.01 10*3/uL (ref 0.00–0.07)
Basophils Absolute: 0 10*3/uL (ref 0.0–0.1)
Basophils Relative: 0 %
Eosinophils Absolute: 0 10*3/uL (ref 0.0–0.5)
Eosinophils Relative: 1 %
HCT: 40.6 % (ref 39.0–52.0)
Hemoglobin: 13.7 g/dL (ref 13.0–17.0)
Immature Granulocytes: 0 %
Lymphocytes Relative: 23 %
Lymphs Abs: 1.1 10*3/uL (ref 0.7–4.0)
MCH: 32.1 pg (ref 26.0–34.0)
MCHC: 33.7 g/dL (ref 30.0–36.0)
MCV: 95.1 fL (ref 80.0–100.0)
Monocytes Absolute: 0.6 10*3/uL (ref 0.1–1.0)
Monocytes Relative: 12 %
Neutro Abs: 3 10*3/uL (ref 1.7–7.7)
Neutrophils Relative %: 64 %
Platelets: 202 10*3/uL (ref 150–400)
RBC: 4.27 MIL/uL (ref 4.22–5.81)
RDW: 13.1 % (ref 11.5–15.5)
WBC: 4.8 10*3/uL (ref 4.0–10.5)
nRBC: 0 % (ref 0.0–0.2)

## 2019-12-03 LAB — COMPREHENSIVE METABOLIC PANEL
ALT: 10 U/L (ref 0–44)
AST: 14 U/L — ABNORMAL LOW (ref 15–41)
Albumin: 3.7 g/dL (ref 3.5–5.0)
Alkaline Phosphatase: 78 U/L (ref 38–126)
Anion gap: 14 (ref 5–15)
BUN: 15 mg/dL (ref 8–23)
CO2: 27 mmol/L (ref 22–32)
Calcium: 9.5 mg/dL (ref 8.9–10.3)
Chloride: 89 mmol/L — ABNORMAL LOW (ref 98–111)
Creatinine, Ser: 0.74 mg/dL (ref 0.61–1.24)
GFR, Estimated: >60 mL/min (ref >60)
Glucose, Bld: 503 mg/dL (ref 70–99)
Potassium: 4.9 mmol/L (ref 3.5–5.1)
Sodium: 130 mmol/L — ABNORMAL LOW (ref 135–145)
Total Bilirubin: 1.1 mg/dL (ref 0.3–1.2)
Total Protein: 6.4 g/dL — ABNORMAL LOW (ref 6.5–8.1)

## 2019-12-03 LAB — I-STAT VENOUS BLOOD GAS, ED
Acid-Base Excess: 3 mmol/L — ABNORMAL HIGH (ref 0.0–2.0)
Bicarbonate: 28.7 mmol/L — ABNORMAL HIGH (ref 20.0–28.0)
Calcium, Ion: 1.19 mmol/L (ref 1.15–1.40)
HCT: 42 % (ref 39.0–52.0)
Hemoglobin: 14.3 g/dL (ref 13.0–17.0)
O2 Saturation: 31 %
Potassium: 4.9 mmol/L (ref 3.5–5.1)
Sodium: 130 mmol/L — ABNORMAL LOW (ref 135–145)
TCO2: 30 mmol/L (ref 22–32)
pCO2, Ven: 46 mmHg (ref 44.0–60.0)
pH, Ven: 7.403 (ref 7.250–7.430)
pO2, Ven: 20 mmHg — CL (ref 32.0–45.0)

## 2019-12-03 LAB — RESP PANEL BY RT-PCR (FLU A&B, COVID) ARPGX2
Influenza A by PCR: NEGATIVE
Influenza B by PCR: NEGATIVE
SARS Coronavirus 2 by RT PCR: NEGATIVE

## 2019-12-03 LAB — RAPID URINE DRUG SCREEN, HOSP PERFORMED
Amphetamines: NOT DETECTED
Barbiturates: NOT DETECTED
Benzodiazepines: NOT DETECTED
Cocaine: NOT DETECTED
Opiates: NOT DETECTED
Tetrahydrocannabinol: NOT DETECTED

## 2019-12-03 LAB — ACETAMINOPHEN LEVEL: Acetaminophen (Tylenol), Serum: <10 ug/mL — ABNORMAL LOW (ref 10–30)

## 2019-12-03 LAB — CBG MONITORING, ED
Glucose-Capillary: 400 mg/dL — ABNORMAL HIGH (ref 70–99)
Glucose-Capillary: 352 mg/dL — ABNORMAL HIGH (ref 70–99)
Glucose-Capillary: 313 mg/dL — ABNORMAL HIGH (ref 70–99)

## 2019-12-03 LAB — GROUP A STREP BY PCR: Group A Strep by PCR: NOT DETECTED

## 2019-12-03 LAB — ETHANOL: Alcohol, Ethyl (B): <10 mg/dL (ref <10)

## 2019-12-03 LAB — SALICYLATE LEVEL: Salicylate Lvl: <7 mg/dL — ABNORMAL LOW (ref 7.0–30.0)

## 2019-12-03 MED ORDER — VITAMIN D 25 MCG (1000 UNIT) PO TABS
5000.0000 [IU] | ORAL_TABLET | Freq: Every day | ORAL | Status: DC
  Administered 2019-12-04 – 2019-12-09 (×6): 5000 [IU] via ORAL
  Filled 2019-12-03 (×6): qty 5

## 2019-12-03 MED ORDER — SODIUM CHLORIDE 0.9 % IV BOLUS
1000.0000 mL | Freq: Once | INTRAVENOUS | Status: AC
  Administered 2019-12-03: 1000 mL via INTRAVENOUS

## 2019-12-03 MED ORDER — APIXABAN 5 MG PO TABS
5.0000 mg | ORAL_TABLET | Freq: Two times a day (BID) | ORAL | Status: DC
  Administered 2019-12-04 – 2019-12-06 (×6): 5 mg via ORAL
  Filled 2019-12-03 (×6): qty 1

## 2019-12-03 MED ORDER — IOHEXOL 300 MG/ML  SOLN
75.0000 mL | Freq: Once | INTRAMUSCULAR | Status: AC | PRN
  Administered 2019-12-03: 75 mL via INTRAVENOUS

## 2019-12-03 MED ORDER — INSULIN ASPART FLEXPEN 100 UNIT/ML ~~LOC~~ SOPN: 10.0000 [IU] | PEN_INJECTOR | SUBCUTANEOUS | Status: DC

## 2019-12-03 MED ORDER — INSULIN ASPART 100 UNIT/ML ~~LOC~~ SOLN
5.0000 [IU] | Freq: Once | SUBCUTANEOUS | Status: AC
  Administered 2019-12-03: 5 [IU] via INTRAVENOUS

## 2019-12-03 MED ORDER — LISINOPRIL 2.5 MG PO TABS
5.0000 mg | ORAL_TABLET | Freq: Every day | ORAL | Status: DC
  Administered 2019-12-04 – 2019-12-09 (×6): 5 mg via ORAL
  Filled 2019-12-03 (×3): qty 2
  Filled 2019-12-03: qty 1
  Filled 2019-12-03 (×2): qty 2

## 2019-12-03 MED ORDER — INSULIN ASPART 100 UNIT/ML ~~LOC~~ SOLN
0.0000 [IU] | Freq: Every day | SUBCUTANEOUS | Status: DC
  Administered 2019-12-04 – 2019-12-06 (×3): 3 [IU] via SUBCUTANEOUS
  Administered 2019-12-08: 2 [IU] via SUBCUTANEOUS

## 2019-12-03 MED ORDER — ATORVASTATIN CALCIUM 40 MG PO TABS
40.0000 mg | ORAL_TABLET | Freq: Every day | ORAL | Status: DC
  Administered 2019-12-04 – 2019-12-09 (×5): 40 mg via ORAL
  Filled 2019-12-03 (×5): qty 1

## 2019-12-03 MED ORDER — PAROXETINE HCL 20 MG PO TABS
20.0000 mg | ORAL_TABLET | Freq: Every day | ORAL | Status: DC
  Administered 2019-12-04 – 2019-12-09 (×6): 20 mg via ORAL
  Filled 2019-12-03 (×7): qty 1

## 2019-12-03 MED ORDER — AMOXICILLIN-POT CLAVULANATE 875-125 MG PO TABS
1.0000 | ORAL_TABLET | Freq: Two times a day (BID) | ORAL | Status: DC
  Administered 2019-12-03 – 2019-12-09 (×12): 1 via ORAL
  Filled 2019-12-03 (×12): qty 1

## 2019-12-03 MED ORDER — APIXABAN 5 MG PO TABS: 5.0000 mg | ORAL_TABLET | Freq: Two times a day (BID) | ORAL | 0 refills | Status: DC

## 2019-12-03 MED ORDER — INSULIN ASPART 100 UNIT/ML ~~LOC~~ SOLN
0.0000 [IU] | Freq: Three times a day (TID) | SUBCUTANEOUS | Status: DC
  Administered 2019-12-04: 11 [IU] via SUBCUTANEOUS
  Administered 2019-12-04: 15 [IU] via SUBCUTANEOUS
  Administered 2019-12-05 (×2): 20 [IU] via SUBCUTANEOUS
  Administered 2019-12-06 (×2): 7 [IU] via SUBCUTANEOUS
  Administered 2019-12-07: 11 [IU] via SUBCUTANEOUS
  Administered 2019-12-07: 20 [IU] via SUBCUTANEOUS
  Administered 2019-12-08: 7 [IU] via SUBCUTANEOUS
  Administered 2019-12-08: 4 [IU] via SUBCUTANEOUS
  Administered 2019-12-08: 7 [IU] via SUBCUTANEOUS
  Administered 2019-12-09: 3 [IU] via SUBCUTANEOUS
  Administered 2019-12-09: 4 [IU] via SUBCUTANEOUS
  Administered 2019-12-09: 7 [IU] via SUBCUTANEOUS

## 2019-12-03 MED ORDER — ASPIRIN EC 325 MG PO TBEC
325.0000 mg | DELAYED_RELEASE_TABLET | Freq: Every day | ORAL | Status: DC
  Filled 2019-12-03: qty 1

## 2019-12-03 MED ORDER — INSULIN ASPART 100 UNIT/ML IV SOLN
10.0000 [IU] | Freq: Once | INTRAVENOUS | Status: AC
  Administered 2019-12-03: 10 [IU] via INTRAVENOUS

## 2019-12-03 MED ORDER — INSULIN GLARGINE 100 UNIT/ML ~~LOC~~ SOLN
13.0000 [IU] | Freq: Every day | SUBCUTANEOUS | Status: DC
  Filled 2019-12-03 (×2): qty 0.13

## 2019-12-03 MED ORDER — SODIUM CHLORIDE 0.9 % IV BOLUS
500.0000 mL | Freq: Once | INTRAVENOUS | Status: AC
  Administered 2019-12-03: 500 mL via INTRAVENOUS

## 2019-12-03 NOTE — Telephone Encounter (Signed)
Patient son called and stated that pt said he was willing to go to the hospital only if his son drops him. Son stated that he will be taking the patient shortly and is requesting a call back from the nurse, please advise. CB is [phone]

## 2019-12-03 NOTE — Telephone Encounter (Signed)
Patient has arrived at the ED.

## 2019-12-03 NOTE — Progress Notes (Signed)
ANTICOAGULATION CONSULT NOTE - Initial Consult  Pharmacy Consult for Apixaban Indication: atrial fibrillation  No Known Allergies  Patient Measurements: Height: 6\' 2"  (188 cm) Weight: 63.5 kg (140 lb) IBW/kg (Calculated) : 82.2 Heparin Dosing Weight:   Vital Signs: Temp: 98 F (36.7 C) (12/02 2040) Temp Source: Oral (12/02 2040) BP: 143/73 (12/02 2040) Pulse Rate: 70 (12/02 2040)  Labs: Recent Labs    12/03/19 1620 12/03/19 1626  HGB 13.7 14.3  HCT 40.6 42.0  PLT 202  --   CREATININE 0.74  --     Estimated Creatinine Clearance: 81.6 mL/min (by C-G formula based on SCr of 0.74 mg/dL).   Medical History: Past Medical History:  Diagnosis Date  . Anxiety   . Aortic dissection ([place])   . Arthritis   . Constipation    pt states goes EOD- hard stools   . Coronary artery disease   . Depression   . Diabetes mellitus without complication ([place])   . DKA (diabetic ketoacidoses)   . Hyperlipidemia   . Hypertension    off meds  . Psoriasis     Medications:  Scheduled:  . amoxicillin-clavulanate  1 tablet Oral Q12H  . apixaban  5 mg Oral BID    Assessment: Patient is a 67 yom that presents to the ED with complaints of SI and pharyngitis. The patient was also found to be in Afib when evaluated. Pharmacy has been asked to dose Apixaban in this patient.   Goal of Therapy:  Monitor platelets by anticoagulation protocol: Yes   Plan:  - Start Apixaban 5mg  PO BID  - Monitor patient for s/s of bleeding - Patient educated   Duanne Limerick PharmD. BCPS  12/03/2019,9:53 PM

## 2019-12-03 NOTE — ED Provider Notes (Signed)
[name] Provider Note   CSN: [id number] Arrival date & time: 12/03/19  1148     History Chief Complaint  Patient presents with  . Otalgia  . Sore Throat    [name] is a 64 y.o. male who presents to the ED with CC of otalgia and sore throat x 1.5 weeks however per triage report "Went to PCP today and was dx with otitis media and pharyngitis. Pt sent here by PCP, has not taken any medications today. Pt states SI over the weekend but no SI or HI over the last couple of days."  States he is here for "IV antibiotics" for his ear and throat infection.  He states that his PCP called the police because his infection was so severe and that it was life-threatening and that he needed to come to the ED right away.  He does mention he has been disabled for about 2 years and has gone through a lot and "anyone would want to end their life with all of this going on."  Denies any specific plan to myself, does not mention having a gun loaded to his head which he told his PCP earlier today.  Additional information obtained by patient's PCP [name] who reports that patient was there today for a follow-up visit from an ED visit on Sunday.  It does appear that patient had initially called the suicide hotline the day before his ED visit and was sent to behavioral health urgent care however he was discharged ultimately.  He then woke up Sunday with an acutely sore throat, left ear pain, inability to swallow.  He was there today to follow-up with this however did mention to PCP that yesterday and this morning he had a "loaded and cocked gun" to his head however could not pull the trigger.  Was also concern for patient's physical health as it appears that he has lost 8 pounds since November 19, is a known noncompliant diabetic.  PCP wanted patient to come to the ED for further evaluation including concern for DKA and deep space infection of his neck as he  is having to swallow his own saliva.  She was also concerned about him mentally and therefore placed IVC paperwork that was sent to the magistrate.    The history is provided by the patient and medical records (PCP).       Past Medical History:  Diagnosis Date  . Anxiety   . Aortic dissection ([place])   . Arthritis   . Constipation    pt states goes EOD- hard stools   . Coronary artery disease   . Depression   . Diabetes mellitus without complication ([place])   . DKA (diabetic ketoacidoses)   . Hyperlipidemia   . Hypertension    off meds  . Psoriasis     Patient Active Problem List   Diagnosis Date Noted  . Aortic dissection ([place]) 02/05/2019  . Coronary artery disease 02/05/2019  . Hyperlipidemia 02/05/2019  . Hypertension 02/05/2019  . Psoriasis 02/05/2019  . Vitamin D deficiency 01/07/2019  . Uncontrolled type 2 diabetes mellitus with hyperglycemia ([place]) 01/07/2019  . Microalbuminuria 01/07/2019  . Depression 06/10/2018  . DKA (diabetic ketoacidoses) 06/10/2018  . Non-intractable vomiting 06/10/2018  . Chronic pain of right knee 05/15/2018  . Patellar tendonitis of right knee 04/24/2018  . Ulnar neuropathy of left upper extremity 07/12/2017  . Pain in finger of left hand 07/12/2017  . Vertigo 03/07/2016  .  Peyronie's disease 06/09/2015  . Erectile dysfunction 03/15/2015  . Right groin pain 01/20/2013  . S/P aortic aneurysm repair 05/19/2012  . S/P CABG (coronary artery bypass graft) 05/19/2012  . Non-proliferative diabetic retinopathy ([place]) 02/27/2011  . Contracture of hand joint 11/01/2010  . Dupuytren's contracture of both hands 11/01/2010    Past Surgical History:  Procedure Laterality Date  . ANKLE SURGERY     right  . CARDIAC CATHETERIZATION  09/25/2001  . COLONOSCOPY     ~10 yr ago- normal  per pt   . CORONARY ARTERY BYPASS GRAFT  09/30/2001   CABG x 4  . LAPAROSCOPIC INGUINAL HERNIA REPAIR Bilateral 02/09/2010  . MASS EXCISION Left 03/12/2013   Procedure:  LEFT INDEX EXCISION MASS AND DEBRIDMENT DISTAL INTERPHALANGEAL JOINT;  Surgeon: Tennis Must, MD;  Location: [facility];  Service: Orthopedics;  Laterality: Left;       Family History  Problem Relation Age of Onset  . Lymphoma Father   . Cancer Paternal Grandmother        Colon Cancer  . Colon cancer Paternal Grandmother   . Colon polyps Neg Hx   . Esophageal cancer Neg Hx   . Rectal cancer Neg Hx   . Stomach cancer Neg Hx     Social History   Tobacco Use  . Smoking status: Current Some Day Smoker    Types: Cigars  . Smokeless tobacco: Never Used  Vaping Use  . Vaping Use: Never used  Substance Use Topics  . Alcohol use: Yes    Comment: daily beer or scotch   . Drug use: No    Home Medications Prior to Admission medications   Medication Sig Start Date End Date Taking? Authorizing Provider  aspirin 325 MG EC tablet Take 325 mg by mouth daily.   Yes [provider]  atorvastatin (LIPITOR) 40 MG tablet Take 40 mg by mouth daily.   Yes [provider]  B Complex Vitamins (B COMPLEX-B12 PO) Take 1 tablet by mouth daily.    Yes [provider]  Cholecalciferol (VITAMIN D) 125 MCG (5000 UT) CAPS Take 5,000 Units by mouth daily.    Yes [provider]  Insulin Aspart FlexPen 100 UNIT/ML SOPN INJECT 16 UNITS INTO THE SKIN THREE TIMES A DAY WITH MEALS Patient taking differently: Inject 10-20 Units into the skin See admin instructions. Injects10-20 units into the skin three times daily per sliding scale. 10/08/19  Yes [name], Rayford Halsted, MD  insulin glargine (LANTUS) 100 UNIT/ML injection Inject 13 Units into the skin at bedtime.    Yes [provider]  lisinopril (ZESTRIL) 5 MG tablet TAKE 1 TABLET BY MOUTH DAILY Patient taking differently: Take 5 mg by mouth daily.  08/04/19  Yes [name], Rayford Halsted, MD  PARoxetine (PAXIL) 20 MG tablet Take 20 mg by mouth daily.   Yes [provider]  Continuous Blood  Gluc Receiver (FREESTYLE LIBRE 14 DAY READER) DEVI 1 each by Does not apply route daily. 02/03/19   [name], Rayford Halsted, MD  Continuous Blood Gluc Sensor (FREESTYLE LIBRE 14 DAY SENSOR) MISC 1 each by Does not apply route daily. 02/03/19   [name], Rayford Halsted, MD  gabapentin (NEURONTIN) 300 MG capsule Take 1 capsule (300 mg total) by mouth at bedtime. Patient not taking: Reported on 12/03/2019 01/14/19   [name], Rayford Halsted, MD  Insulin Pen Needle (DROPLET PEN NEEDLES) 32G X 4 MM MISC Use daily for glucose control 01/14/19  [name], Rayford Halsted, MD  traMADol (ULTRAM) 50 MG tablet Take 1 tablet (50 mg total) by mouth 2 (two) times daily as needed. Patient not taking: Reported on 12/03/2019 04/08/19   Aundra Dubin, PA-C    Allergies    Patient has no known allergies.  Review of Systems   Review of Systems  Constitutional: Negative for chills and fever.  HENT: Positive for ear pain, sore throat and trouble swallowing. Negative for voice change.   Respiratory: Negative for cough.   Psychiatric/Behavioral: Positive for suicidal ideas. Negative for hallucinations.  All other systems reviewed and are negative.   Physical Exam Updated Vital Signs BP 125/83   Pulse 74   Temp 97.9 F (36.6 C) (Oral)   Resp 16   Ht 6\' 2"  (1.88 m)   Wt 63.5 kg   SpO2 100%   BMI 17.97 kg/m   Physical Exam Vitals and nursing note reviewed.  Constitutional:      Appearance: He is not ill-appearing.     Comments: Cachectic appearing male  HENT:     Head: Normocephalic and atraumatic.     Right Ear: Tympanic membrane normal.     Left Ear: Tympanic membrane is erythematous.     Mouth/Throat:     Mouth: Mucous membranes are dry.     Pharynx: Uvula midline. Pharyngeal swelling and posterior oropharyngeal erythema present.     Tonsils: No tonsillar exudate.  Eyes:     Conjunctiva/sclera: Conjunctivae normal.  Cardiovascular:     Rate and Rhythm: Normal rate and regular rhythm.      Heart sounds: Normal heart sounds.  Pulmonary:     Effort: Pulmonary effort is normal.     Breath sounds: Normal breath sounds. No wheezing, rhonchi or rales.  Abdominal:     Palpations: Abdomen is soft.     Tenderness: There is no abdominal tenderness. There is no guarding or rebound.  Musculoskeletal:     Cervical back: Normal range of motion and neck supple.  Lymphadenopathy:     Cervical: Cervical adenopathy present.  Skin:    General: Skin is warm and dry.  Neurological:     Mental Status: He is alert.     ED Results / Procedures / Treatments   Labs (all labs ordered are listed, but only abnormal results are displayed) Labs Reviewed  COMPREHENSIVE METABOLIC PANEL - Abnormal; Notable for the following components:      Result Value   Sodium 130 (*)    Chloride 89 (*)    Glucose, Bld 503 (*)    Total Protein 6.4 (*)    AST 14 (*)    All other components within normal limits  SALICYLATE LEVEL - Abnormal; Notable for the following components:   Salicylate Lvl <1.0 (*)    All other components within normal limits  ACETAMINOPHEN LEVEL - Abnormal; Notable for the following components:   Acetaminophen (Tylenol), Serum <10 (*)    All other components within normal limits  CBG MONITORING, ED - Abnormal; Notable for the following components:   Glucose-Capillary 400 (*)    All other components within normal limits  I-STAT VENOUS BLOOD GAS, ED - Abnormal; Notable for the following components:   pO2, Ven 20.0 (*)    Bicarbonate 28.7 (*)    Acid-Base Excess 3.0 (*)    Sodium 130 (*)    All other components within normal limits  CBG MONITORING, ED - Abnormal; Notable for the following components:   Glucose-Capillary 352 (*)  All other components within normal limits  CBG MONITORING, ED - Abnormal; Notable for the following components:   Glucose-Capillary 313 (*)    All other components within normal limits  RESP PANEL BY RT-PCR (FLU A&B, COVID) ARPGX2  GROUP A STREP BY PCR    ETHANOL  RAPID URINE DRUG SCREEN, HOSP PERFORMED  CBC WITH DIFFERENTIAL/PLATELET    EKG EKG Interpretation  Date/Time:  Thursday December 03 2019 16:28:49 EST Ventricular Rate:  60 PR Interval:    QRS Duration: 114 QT Interval:  494 QTC Calculation: 494 R Axis:   61 Text Interpretation: Atrial fibrillation Prolonged QT Abnormal ECG Confirmed by Davonna Belling [phone]) on 12/03/2019 4:36:53 PM   Radiology CT Soft Tissue Neck W Contrast  Result Date: 12/03/2019 CLINICAL DATA:  Sore throat difficulty swallowing. Rule out deep space infection. EXAM: CT NECK WITH CONTRAST TECHNIQUE: Multidetector CT imaging of the neck was performed using the standard protocol following the bolus administration of intravenous contrast. CONTRAST:  33mL OMNIPAQUE IOHEXOL 300 MG/ML  SOLN COMPARISON:  None. FINDINGS: Pharynx and larynx: Normal. No mass or swelling. Salivary glands: No inflammation, mass, or stone. Thyroid: Negative Lymph nodes: No enlarged lymph nodes in the neck. Vascular: Normal vascular enhancement. Atherosclerotic disease of the carotid bifurcation bilaterally. Atherosclerotic calcification aortic arch. Limited intracranial: Negative Visualized orbits: Not included Mastoids and visualized paranasal sinuses: Paranasal sinuses clear. Mastoid clear. Skeleton: Mild cervical spondylosis. Foraminal narrowing bilaterally C5-6 and C6-7 due to spurring. Median sternotomy wires. Upper chest: Lung apices clear bilaterally. Other: None IMPRESSION: No pharyngeal edema. Negative for pharyngeal abscess. Normal airway. Negative for mass or adenopathy in the neck. Electronically Signed   By: Franchot Gallo M.D.   On: 12/03/2019 19:23    Procedures Procedures (including critical care time)  Medications Ordered in ED Medications  amoxicillin-clavulanate (AUGMENTIN) 875-125 MG per tablet 1 tablet (1 tablet Oral Given 12/03/19 2105)  apixaban (ELIQUIS) tablet 5 mg (has no administration in time range)  sodium  chloride 0.9 % bolus 1,000 mL (0 mLs Intravenous Stopped 12/03/19 1800)  insulin aspart (novoLOG) injection 10 Units (10 Units Intravenous Given 12/03/19 1825)  iohexol (OMNIPAQUE) 300 MG/ML solution 75 mL (75 mLs Intravenous Contrast Given 12/03/19 1838)  sodium chloride 0.9 % bolus 500 mL (0 mLs Intravenous Stopped 12/03/19 2106)  insulin aspart (novoLOG) injection 5 Units (5 Units Intravenous Given 12/03/19 2002)    ED Course  I have reviewed the triage vital signs and the nursing notes.  Pertinent labs & imaging results that were available during my care of the patient were reviewed by me and considered in my medical decision making (see chart for details).  Clinical Course as of Dec 02 2149  Thu Dec 03, 2019  1708 Glucose(!!): 503 [MV]  1713 WBC: 4.8 [MV]  1751 Group A Strep by PCR: NOT DETECTED [MV]    Clinical Course User Index [MV] Eustaquio Maize, PA-C   MDM Rules/Calculators/A&P     CHA2DS2-VASc Score: 65                     64 year old male presents to the ED today with chief complaint of otalgia and sore throat however does appear that he was seen at PCPs office today and admitted to having a loaded and cocked gun to his forehead last night and earlier this morning and was therefore IVCd by PCP.  Patient believes that he is here for IV antibiotics for his ear infection and sore throat.  I had spoken to  his PCP directly who did send him to the ED for both medical and psychiatric purposes.  She was firstly concerned that appears the patient has lost 8 pounds in approximately 2 to 3 weeks.  She reports that he is a known noncompliant diabetic and there is concern for DKA.  She also reports that he reported inability to swallow his own saliva and she was worried about a deep space infection of his neck.  While patient was at PCPs today he did admit to being suicidal with a plan and therefore IVC paperwork was taken out.  We have called over to the magistrate's office who are unable to see  prior IVC is filled out today, they have recommended we fill out another IVC in the ED today.  We will plan to do this.  On [place] to the ED vitals are stable.  Patient is afebrile, nontachycardic nontachypneic.  Blood pressure normotensive at 125/83 and satting 100% on room air.  On exam patient does have an erythematous TM on the left, no perforation appreciated.  He also has posterior oropharyngeal erythema and edema, no exudate.  Uvula is midline.  Patient states he has severe difficulty with swallowing his own saliva however he is able to do this, no drooling today, no hot potato voice.  Does appear patient was seen in the ED last week for a sore throat however I do not see that he was tested for strep, will plan to do this at this time.  Given concern for inability to swallow his own spit we will also plan for CT of the soft tissue of his neck.  We will also provide fluids at this time as patient does appear dry, check CBG, check BG to assess for DKA.  Once he is medically cleared he will be evaluated by TTS.   CBC without leukocytosis. Hgb stable at 13.7 CMP with critical value of glucose 503. Bicarb normal at 27. Sodium 130 however with glucose correction within normal limits. Chloride 87. No gap.  VBG with normal pH and no signs concerning for DKA Strep negative COVID and flu negative  EKG obtained to assess for qtc prolongation incase pt needed to receive benzos; does appear like he may be in a fib or aflutter. Will obtain repeat EKG.   CT scan negative for any abnormality. Suspect viral pharyngitis and AOM. Will start on Augmentin.   Repeat EKG with questionable a flutter; attending physician Dr. Alvino Chapel evaluated EKG and agrees. CHADS 2 VASC of 4. Will start on oral anticoagulation. Pharmacy to dose.   Pt is medically cleared at this time. Will place in purple pending TTS consult. He is IVC'd.   Final Clinical Impression(s) / ED Diagnoses Final diagnoses:  Non-recurrent acute  suppurative otitis media of left ear without spontaneous rupture of tympanic membrane  Viral pharyngitis  Suicidal ideation  Atrial flutter, unspecified type ([place])  Type 2 diabetes mellitus with hyperglycemia, with long-term current use of insulin [facility])    Rx / DC Orders ED Discharge Orders    None       Eustaquio Maize, PA-C 12/03/19 2154    Davonna Belling, MD 12/04/19 [phone]

## 2019-12-03 NOTE — Telephone Encounter (Signed)
Pt.s IVC paperwork completed by physician and faxed to magistrate [phone] for completion.

## 2019-12-03 NOTE — Progress Notes (Addendum)
Established Patient Office Visit     This visit occurred during the SARS-CoV-2 public health emergency.  Safety protocols were in place, including screening questions prior to the visit, additional usage of staff PPE, and extensive cleaning of exam room while observing appropriate contact time as indicated for disinfecting solutions.    CC/Reason for Visit: Severe weakness, continued falling, inability to swallow, suicidal  HPI: [name] [name] is a 64 y.o. male who is coming in today for the above mentioned reasons. Past Medical History is significant for:  very much uncontrolled diabetes, known medication noncompliance, hypertension, hyperlipidemia, aortic dissection status post repair, major depressive disorder, chronic pain syndrome particularly with issues in the right knee.   On Saturday he spent the entire day at behavioral health Hospital after he called the suicide line with active suicidal thoughts.  The Police Department came and transported him to the hospital.  It was ultimately decided to discharge him into the care of his sister who lived less than a mile away from his house after she had agreed to remove his guns.  He tells me that his sister never came and picked him up.  On Sunday morning he woke up with severe left ear pain and difficulty swallowing.  He ended up in the emergency department.  He tells me that they did nothing other than have a look in his ear.  They gave him a Decadron injection which helped and discharged him home.  His symptoms have persisted.  He has lost an additional 6 pounds since his visit on November 10.  He is unable to swallow anything.  He has been drinking 1 boost a day.  He continues to have active suicidal thoughts.  He tells me that yesterday and this morning he held a loaded and cocked gun to his head but was unable to pull the trigger.  He looks a lot weaker, paler than he did even 2 weeks ago.  He is almost falling walking down the  hallway.  Just going from the chair to the exam table in his room was with extreme difficulty and almost total assistance.  Past Medical/Surgical History: Past Medical History:  Diagnosis Date  . Anxiety   . Aortic dissection ([place])   . Arthritis   . Constipation    pt states goes EOD- hard stools   . Coronary artery disease   . Depression   . Diabetes mellitus without complication ([place])   . DKA (diabetic ketoacidoses)   . Hyperlipidemia   . Hypertension    off meds  . Psoriasis     Past Surgical History:  Procedure Laterality Date  . ANKLE SURGERY     right  . CARDIAC CATHETERIZATION  09/25/2001  . COLONOSCOPY     ~10 yr ago- normal  per pt   . CORONARY ARTERY BYPASS GRAFT  09/30/2001   CABG x 4  . LAPAROSCOPIC INGUINAL HERNIA REPAIR Bilateral 02/09/2010  . MASS EXCISION Left 03/12/2013   Procedure: LEFT INDEX EXCISION MASS AND DEBRIDMENT DISTAL INTERPHALANGEAL JOINT;  Surgeon: Tennis Must, MD;  Location: [facility];  Service: Orthopedics;  Laterality: Left;    Social History:  reports that he has been smoking cigars. He has never used smokeless tobacco. He reports current alcohol use. He reports that he does not use drugs.  Allergies: No Known Allergies  Family History:  Family History  Problem Relation Age of Onset  . Lymphoma Father   . Cancer Paternal Grandmother  Colon Cancer  . Colon cancer Paternal Grandmother   . Colon polyps Neg Hx   . Esophageal cancer Neg Hx   . Rectal cancer Neg Hx   . Stomach cancer Neg Hx      Current Outpatient Medications:  .  aspirin 325 MG EC tablet, Take 325 mg by mouth daily., Disp: , Rfl:  .  atorvastatin (LIPITOR) 40 MG tablet, Take 40 mg by mouth daily., Disp: , Rfl:  .  B Complex Vitamins (B COMPLEX-B12 PO), Take by mouth daily., Disp: , Rfl:  .  cholecalciferol (VITAMIN D3) 25 MCG (1000 UT) tablet, Take 1,000 Units by mouth daily. , Disp: , Rfl:  .  Continuous Blood Gluc Receiver (FREESTYLE LIBRE 14  DAY READER) DEVI, 1 each by Does not apply route daily., Disp: 1 each, Rfl: 0 .  Continuous Blood Gluc Sensor (FREESTYLE LIBRE 14 DAY SENSOR) MISC, 1 each by Does not apply route daily., Disp: 2 each, Rfl: 5 .  Insulin Aspart FlexPen 100 UNIT/ML SOPN, INJECT 16 UNITS INTO THE SKIN THREE TIMES A DAY WITH MEALS, Disp: 15 mL, Rfl: 0 .  insulin glargine (LANTUS) 100 UNIT/ML injection, Inject 12 Units into the skin daily. Pt uses at bedtime, Disp: , Rfl:  .  Insulin Pen Needle (DROPLET PEN NEEDLES) 32G X 4 MM MISC, Use daily for glucose control, Disp: 100 each, Rfl: 5 .  lisinopril (ZESTRIL) 5 MG tablet, TAKE 1 TABLET BY MOUTH DAILY, Disp: 90 tablet, Rfl: 1 .  metFORMIN (GLUCOPHAGE) 1000 MG tablet, Take 1,000 mg by mouth 2 (two) times daily with a meal. , Disp: , Rfl:  .  PARoxetine (PAXIL) 20 MG tablet, Take 20 mg by mouth daily., Disp: , Rfl:  .  traMADol (ULTRAM) 50 MG tablet, Take 1 tablet (50 mg total) by mouth 2 (two) times daily as needed., Disp: 30 tablet, Rfl: 0 .  gabapentin (NEURONTIN) 300 MG capsule, Take 1 capsule (300 mg total) by mouth at bedtime. (Patient not taking: Reported on 10/07/2019), Disp: 90 capsule, Rfl: 1  Review of Systems:  Constitutional: Denies fever, chills, diaphoresis. HEENT: Denies photophobia, eye pain, redness, hearing loss,  rhinorrhea, sneezing, mouth sores, neck stiffness and tinnitus.   Respiratory: Denies SOB, DOE, cough, chest tightness,  and wheezing.   Cardiovascular: Denies chest pain, palpitations and leg swelling.  Gastrointestinal: Denies nausea, vomiting, abdominal pain, diarrhea, constipation, blood in stool and abdominal distention.  Genitourinary: Denies dysuria, urgency, frequency, hematuria, flank pain and difficulty urinating.  Endocrine: Denies: hot or cold intolerance, sweats, changes in hair or nails, polyuria, polydipsia. Musculoskeletal: Denies myalgias, back pain, joint swelling. Skin: Denies, rash and wound.  Neurological: Denies  dizziness, seizures, syncope and headaches.  Hematological: Denies adenopathy. Easy bruising, personal or family bleeding history  Psychiatric/Behavioral: Positive for suicidal ideation, mood changes, confusion, nervousness, sleep disturbance and agitation    Physical Exam: Vitals:   12/03/19 0810  BP: 112/60  Pulse: 74  Temp: 98.2 F (36.8 C)  TempSrc: Oral  SpO2: 98%  Weight: 146 lb 11.2 oz (66.5 kg)  Height: 6\' 2"  (1.88 m)    Body mass index is 18.84 kg/m.   Constitutional: Pale, gait and balance observed and unsteady, unkempt Eyes: PERRL, lids and conjunctivae are pale ENMT: Mucous membranes are dry. Posterior pharynx clear of any exudate but very erythematous.  He appears to have soft palate and uvular swelling.  Tympanic membrane is pearly white, no erythema or bulging on the right, left is significantly erythematous with an  air-fluid level, I cannot exclude a tympanic membrane perforation. Respiratory: clear to auscultation bilaterally, no wheezing, no crackles. Normal respiratory effort. No accessory muscle use.  Cardiovascular: Regular rate and rhythm, no murmurs / rubs / gallops. No extremity edema.  Psychiatric: Normal judgment and insight. Alert and oriented x 3. Normal mood.  Active suicidal ideation   Impression and Plan:  Acute suppurative otitis media of left ear without spontaneous rupture of tympanic membrane, recurrence not specified  Acute pharyngitis, unspecified etiology  Suicidal behavior with attempted self-injury ([place])  Uncontrolled type 2 diabetes mellitus with hyperglycemia [facility])  -Mr. Reser is in need of acute medical and psychiatric care beyond what can be provided in the outpatient setting. -He appears to have on exam an acute left suppurative otitis media and possibly an acute pharyngitis.   He appears extremely dehydrated with acute failure to thrive.  He is a known uncontrolled, noncompliant diabetic. -I believe he needs a soft tissue x-ray  of his neck given his severe difficulty swallowing. -He will also need fluids and antibiotic therapy. -I am extremely concerned about his active suicidal ideation.  I can see an acute behavioral health assessment last Saturday where supposedly guns were supposed to be removed from his house, however he tells me that he held a loaded gun to his head this morning and yesterday.  He will also need psychiatric assessment. -EMS services have been contacted for transport to the hospital. -He is adamant about leaving home today.  We have had to have a clinical staff member in the room with him at all times to prevent this from happening. We have deployed the Police Department. -IVC paperwork has been filled and faxed to the [facility].  Time spent: 55 minutes with patient for exam, counseling, contacting emergent services and discussing case with his son [name] [name] with patient's consent.     Lelon Frohlich, MD [facility] at [facility]

## 2019-12-03 NOTE — Patient Outreach (Signed)
°  [facility]) Care Management Chronic Special Needs Program    12/03/2019  Name: Cletis Clack, DOB: July 26, 1955  MRN: [id number]   [facility] care management team has assumed care and services for this member. Case Closed by [facility] care management.   Quinn Plowman RN,BSN,CCM [facility] [phone]

## 2019-12-03 NOTE — ED Triage Notes (Signed)
Patient arrives to ED with c/o left ear pain and throat pain x1 week. Went to PCP today and was dx with otitis media and pharyngitis. Pt sent here by PCP, has not taken any medications today. Pt states SI over the weekend but no SI or HI over the last couple of days.

## 2019-12-04 LAB — CBG MONITORING, ED
Glucose-Capillary: 335 mg/dL — ABNORMAL HIGH (ref 70–99)
Glucose-Capillary: 266 mg/dL — ABNORMAL HIGH (ref 70–99)
Glucose-Capillary: 333 mg/dL — ABNORMAL HIGH (ref 70–99)
Glucose-Capillary: 320 mg/dL — ABNORMAL HIGH (ref 70–99)

## 2019-12-04 MED ORDER — ASPIRIN EC 81 MG PO TBEC
81.0000 mg | DELAYED_RELEASE_TABLET | Freq: Every day | ORAL | Status: DC
  Administered 2019-12-04 – 2019-12-09 (×6): 81 mg via ORAL
  Filled 2019-12-04 (×6): qty 1

## 2019-12-04 MED ORDER — INSULIN GLARGINE 100 UNIT/ML ~~LOC~~ SOLN
13.0000 [IU] | Freq: Every day | SUBCUTANEOUS | Status: DC
  Administered 2019-12-04 – 2019-12-09 (×6): 13 [IU] via SUBCUTANEOUS
  Filled 2019-12-04 (×6): qty 0.13

## 2019-12-04 NOTE — ED Notes (Signed)
TTS in progress 

## 2019-12-04 NOTE — BH Assessment (Signed)
Comprehensive Clinical Assessment (CCA) Screening, Triage and Referral Note  12/04/2019 [name] [name] [id number]    [name] [name] is a 64 year old male who presents to [facility] via [employer] currently under IVC by doctor. Patient states that a few days ago he made comments about being suicidal and wanting to end his own life after becoming frustrated and not receiving proper help from his health care providers in regards to his concerns about his mental health, states he feels he needs a counselor because he feels he has no control over his life. Pt states his oldest son and him stay together but son has control over finances as well as other things. Pt currently denies SI, HI, AVH and SIB but admits to the SI thoughts a few days ago.Per pt chart pt also has muliple health issues and also came to the ED for, "States he is here for "IV antibiotics" for his ear and throat infection and  deep space infection of his neck as he is having to swallow his own saliva.  And per MD patient, "It does appear that patient had initially called the suicide hotline the day before his ED visit and was sent to behavioral health urgent care however he was discharged ultimately.  He then woke up Sunday with an acutely sore throat, left ear pain, inability to swallow.  He was there today to follow-up with this however did mention to PCP that yesterday and this morning he had a "loaded and cocked gun" to his head however could not pull the trigger" . Per pt chart he was seem at [facility] 3 days ago by TTS and provider for SI thoughts and had possession of guns in his home. Per TTS note 11/28/19, "Pt states he told his nurse aide he was having suicidal thoughts and she called the police for a wellness check. Pt was then transported to [facility] by [employer]. Pt reports having access to 3 shotguns that are currently located in his home ("upstairs in the closet"). Pt reports these thoughts were triggered due to losing his retirement accounts to  his Music therapist, he lost his fiance because "she didn't want to be with anyone who didn't have money", and chronic knee (right) pain. After further exploration, pt reports his fiance left him "a year ago" and he lost his retirement account "two and a half years ago".  During current assessment pt does not mention any issues with finances but states he does struggle with caring for self. Pt currently shares a residence with his son [name] [name]: [phone]) who pt reports "he avoids me like the plague". Pt reports poor sleep patterns (2-3 hours of sleep) and poor appetite (pt unable to prepare his own meals). Patient states that he would like therapy at this time and open to additional treatment    Per IVC: Pt was seen at PCP's office this morning and admitted to having a loaded and cocked gun to his head yesterday and this morning howevere was unable to pull the trigger. Pt attempted to leave the PCP's office however [employer] was called as patient was deemed a threat to himself with need for immediate psychiatric  Evaluation. It does appear he was seen at [facility] last week where there mentions of guns and patient was instructed to remove all guns from his home however does not seem to have done this.      Diagnosis:  Major Depressive Disorder, recurrent, severe  Disposition: Lindon Romp, PMHNP, recommends pt for  inpatient treatment, social work to seek gero-psych placement. Chief Complaint:  Chief Complaint  Patient presents with  . Otalgia  . Sore Throat   Visit Diagnosis: under IVC  Patient Reported Information How did you hear about [place]? Other (Comment) ([employer])   Referral name: [employer]  Referral phone number: No data recorded Whom do you see for routine medical problems? No data recorded  Practice/Facility Name: No data recorded  Practice/Facility Phone Number: No data recorded  Name of Contact: No data recorded  Contact Number: No data recorded  Contact Fax Number: No  data recorded  Prescriber Name: No data recorded  Prescriber Address (if known): No data recorded What Is the Reason for Your Visit/Call Today? No data recorded How Long Has This Been Causing You Problems? > than 6 months  Have You Recently Been in Any Inpatient Treatment (Hospital/Detox/Crisis Center/28-Day Program)? No   Name/Location of Program/Hospital:No data recorded  How Long Were You There? No data recorded  When Were You Discharged? No data recorded Have You Ever Received Services From [facility] Before? No   Who Do You See at [facility]? No data recorded Have You Recently Had Any Thoughts About Hurting Yourself? Yes   Are You Planning to Commit Suicide/Harm Yourself At This time?  Yes  Have you Recently Had Thoughts About Hurting Someone [place]? No   Explanation: No data recorded Have You Used Any Alcohol or Drugs in the Past 24 Hours? No   How Long Ago Did You Use Drugs or Alcohol?  No data recorded  What Did You Use and How Much? No data recorded What Do You Feel Would Help You the Most Today? Assessment Only  Do You Currently Have a Therapist/Psychiatrist? No   Name of Therapist/Psychiatrist: No data recorded  Have You Been Recently Discharged From Any Office Practice or Programs? No   Explanation of Discharge From Practice/Program:  No data recorded    CCA Screening Triage Referral Assessment Type of Contact: Tele-Assessment  Is this Initial or Reassessment? Initial  Date Telepsych consult ordered in [facility]:  12/04/19  Time Telepsych consult ordered in [facility]:  No data recorded Patient Reported Information Reviewed? Yes   Patient Left Without Being Seen? No data recorded  Reason for Not Completing Assessment: No data recorded Collateral Involvement: No data recorded Does Patient Have a Roby? No data recorded  Name and Contact of Legal Guardian:  No data recorded If Minor and Not Living with Parent(s), Who has Custody? No data  recorded Is CPS involved or ever been involved? Never  Is APS involved or ever been involved? Never  Patient Determined To Be At Risk for Harm To Self or Others Based on Review of Patient Reported Information or Presenting Complaint? No   Method: No data recorded  Availability of Means: No data recorded  Intent: No data recorded  Notification Required: No data recorded  Additional Information for Danger to Others Potential:  No data recorded  Additional Comments for Danger to Others Potential:  No data recorded  Are There Guns or Other Weapons in Your Home?  No data recorded   Types of Guns/Weapons: No data recorded   Are These Weapons Safely Secured?                              No data recorded   Who Could Verify You Are Able To Have These Secured:    No data recorded  Do You Have any Outstanding Charges, Pending Court Dates, Parole/Probation? No data recorded Contacted To Inform of Risk of Harm To Self or Others: No data recorded Location of Assessment: [facility]  Does Patient Present under Involuntary Commitment? No   IVC Papers Initial File Date: Yes  [place] of Residence: [place]  Patient Currently Receiving the Following Services: Not Receiving Services   Determination of Need: Emergent (2 hours)   Options For Referral: Inpatient Treatment  Donato Heinz, [place]

## 2019-12-04 NOTE — ED Notes (Signed)
CBG 333.

## 2019-12-04 NOTE — ED Notes (Signed)
Pt awake and pleasant at this time.  

## 2019-12-04 NOTE — ED Provider Notes (Signed)
Emergency Medicine Observation Re-evaluation Note  [name] [name] is a 64 y.o. male, seen on rounds today.  Pt initially presented to the ED for complaints of Otalgia and Sore Throat Currently, the patient is eating lunch.  Physical Exam  BP 139/83   Pulse 83   Temp 98.1 F (36.7 C) (Oral)   Resp 18   Ht 6\' 2"  (1.88 m)   Wt 63.5 kg   SpO2 97%   BMI 17.97 kg/m  Physical Exam General: alert, non toxic, thin Lungs: respirations even and unlaboted Psych: clam, appropriate   ED Course / MDM  EKG:EKG Interpretation  Date/Time:  Thursday December 03 2019 20:47:21 EST Ventricular Rate:  61 PR Interval:    QRS Duration: 92 QT Interval:  488 QTC Calculation: 491 R Axis:   65 Text Interpretation: Atrial fibrillation Nonspecific T wave abnormality Prolonged QT Abnormal ECG When compared with ECG of EARLIER SAME DATE No significant change was found Confirmed by Delora Fuel ([id number]) on 12/04/2019 12:15:22 AM  Clinical Course as of Dec 04 1255  Thu Dec 03, 2019  1708 Glucose(!!): 503 [MV]  1713 WBC: 4.8 [MV]  1751 Group A Strep by PCR: NOT DETECTED [MV]    Clinical Course User Index [MV] Eustaquio Maize, PA-C   I have reviewed the labs performed to date as well as medications administered while in observation.  Recent changes in the last 24 hours include intermittent compliance with medications/insulin.  Plan  Current plan is for admit to geriatric psych. Continue on antibiotics. Reports improvement in ear and throat pain.  Patient is under full IVC at this time. Discussed starting Eliquis for a-flutter, states he has never been diagnosed with a-flutter to his knowledge. Review of medications, currently has ASA and Eliquis ordered. RN has discussed with pharmacy who recommends decreasing to 81mg  ASA.    Tacy Learn, PA-C 12/04/19 1257    Carmin Muskrat, MD 12/04/19 [phone]

## 2019-12-04 NOTE — ED Notes (Signed)
Pt notified son would like to speak to him. Pt states he will call son later. Pt currently resting.

## 2019-12-04 NOTE — ED Notes (Signed)
Meal tray delivered, pt eating 

## 2019-12-04 NOTE — Progress Notes (Signed)
Per Lindon Romp, PMHNP patient meets inpatient gero/psych criteria.  Referrals sent to the following facilities.  [facility]  [facility]  [facility]  [facility]  [facility]    CSW to follow up.  [place] Disposition [phone] (cell)

## 2019-12-05 LAB — CBG MONITORING, ED
Glucose-Capillary: 247 mg/dL — ABNORMAL HIGH (ref 70–99)
Glucose-Capillary: 402 mg/dL — ABNORMAL HIGH (ref 70–99)
Glucose-Capillary: 389 mg/dL — ABNORMAL HIGH (ref 70–99)
Glucose-Capillary: 265 mg/dL — ABNORMAL HIGH (ref 70–99)

## 2019-12-05 MED ORDER — ACETAMINOPHEN 325 MG PO TABS
650.0000 mg | ORAL_TABLET | Freq: Once | ORAL | Status: AC | PRN
  Administered 2019-12-05: 650 mg via ORAL
  Filled 2019-12-05: qty 2

## 2019-12-05 NOTE — ED Provider Notes (Signed)
Emergency Medicine Observation Re-evaluation Note  Shine Norfleet Capers is a 64 y.o. male, seen on rounds today.  Pt initially presented to the ED for complaints of Otalgia and Sore Throat Currently, the patient is awaiting inpatient psych treatment.  Physical Exam  BP 119/78   Pulse 70   Temp 98.6 F (37 C) (Oral)   Resp 16   Ht 1.88 m (6\' 2" )   Wt 63.5 kg   SpO2 99%   BMI 17.97 kg/m  Physical Exam General: Alert and oriented x4. Cardiac: Regular Lungs: Clear Psych: Cooperative  ED Course / MDM  EKG:EKG Interpretation  Date/Time:  Thursday December 03 2019 20:47:21 EST Ventricular Rate:  61 PR Interval:    QRS Duration: 92 QT Interval:  488 QTC Calculation: 491 R Axis:   65 Text Interpretation: Atrial fibrillation Nonspecific T wave abnormality Prolonged QT Abnormal ECG When compared with ECG of EARLIER SAME DATE No significant change was found Confirmed by Delora Fuel ([id number]) on 12/04/2019 12:15:22 AM  Clinical Course as of Dec 05 1226  Thu Dec 03, 2019  1708 Glucose(!!): 503 [MV]  1713 WBC: 4.8 [MV]  1751 Group A Strep by PCR: NOT DETECTED [MV]    Clinical Course User Index [MV] Eustaquio Maize, PA-C   I have reviewed the labs performed to date as well as medications administered while in observation.  Recent changes in the last 24 hours include patient requesting to use his home insulin sliding scale.  This will be ordered..  Plan  Current plan is for placement by behavioral health. Patient is under full IVC at this time.   Lacretia Leigh, MD 12/05/19 1229

## 2019-12-05 NOTE — ED Notes (Signed)
Son, [name] updated, would like an update once placement is established.

## 2019-12-05 NOTE — ED Notes (Signed)
Dr. Antony Haste notified of 402 CBG. Pt verbalized understanding of importance of insulin coverage. Meal coverage administered.

## 2019-12-05 NOTE — ED Notes (Signed)
This RN attempted to give pt meal coverage dose of insulin prior to pt eating breakfast. Pt states, " you all have not been controlling my diabetes as well as I would at home and I'm afraid that if you give me the insulin it will drop my sugar and you wont do anything". This RN explained that the amount of insulin is warranted for your last sugar check and is important to control your sugar. Pt still adamantly refused.

## 2019-12-06 LAB — CBC WITH DIFFERENTIAL/PLATELET
Abs Immature Granulocytes: 0.03 10*3/uL (ref 0.00–0.07)
Basophils Absolute: 0 10*3/uL (ref 0.0–0.1)
Basophils Relative: 1 %
Eosinophils Absolute: 0.1 10*3/uL (ref 0.0–0.5)
Eosinophils Relative: 3 %
HCT: 36 % — ABNORMAL LOW (ref 39.0–52.0)
Hemoglobin: 12.2 g/dL — ABNORMAL LOW (ref 13.0–17.0)
Immature Granulocytes: 1 %
Lymphocytes Relative: 34 %
Lymphs Abs: 1.7 10*3/uL (ref 0.7–4.0)
MCH: 32.6 pg (ref 26.0–34.0)
MCHC: 33.9 g/dL (ref 30.0–36.0)
MCV: 96.3 fL (ref 80.0–100.0)
Monocytes Absolute: 0.5 10*3/uL (ref 0.1–1.0)
Monocytes Relative: 9 %
Neutro Abs: 2.6 10*3/uL (ref 1.7–7.7)
Neutrophils Relative %: 52 %
Platelets: 183 10*3/uL (ref 150–400)
RBC: 3.74 MIL/uL — ABNORMAL LOW (ref 4.22–5.81)
RDW: 13.3 % (ref 11.5–15.5)
WBC: 4.9 10*3/uL (ref 4.0–10.5)
nRBC: 0 % (ref 0.0–0.2)

## 2019-12-06 LAB — COMPREHENSIVE METABOLIC PANEL
ALT: 10 U/L (ref 0–44)
AST: 17 U/L (ref 15–41)
Albumin: 2.8 g/dL — ABNORMAL LOW (ref 3.5–5.0)
Alkaline Phosphatase: 43 U/L (ref 38–126)
Anion gap: 7 (ref 5–15)
BUN: 16 mg/dL (ref 8–23)
CO2: 28 mmol/L (ref 22–32)
Calcium: 9 mg/dL (ref 8.9–10.3)
Chloride: 98 mmol/L (ref 98–111)
Creatinine, Ser: 0.6 mg/dL — ABNORMAL LOW (ref 0.61–1.24)
GFR, Estimated: >60 mL/min (ref >60)
Glucose, Bld: 232 mg/dL — ABNORMAL HIGH (ref 70–99)
Potassium: 4 mmol/L (ref 3.5–5.1)
Sodium: 133 mmol/L — ABNORMAL LOW (ref 135–145)
Total Bilirubin: 0.6 mg/dL (ref 0.3–1.2)
Total Protein: 5.3 g/dL — ABNORMAL LOW (ref 6.5–8.1)

## 2019-12-06 LAB — CBG MONITORING, ED
Glucose-Capillary: 241 mg/dL — ABNORMAL HIGH (ref 70–99)
Glucose-Capillary: 213 mg/dL — ABNORMAL HIGH (ref 70–99)
Glucose-Capillary: 119 mg/dL — ABNORMAL HIGH (ref 70–99)
Glucose-Capillary: 264 mg/dL — ABNORMAL HIGH (ref 70–99)

## 2019-12-06 MED ORDER — ACETAMINOPHEN 325 MG PO TABS
650.0000 mg | ORAL_TABLET | Freq: Once | ORAL | Status: AC
  Administered 2019-12-06: 650 mg via ORAL
  Filled 2019-12-06: qty 2

## 2019-12-06 NOTE — ED Notes (Signed)
Pt ambulated in purple zone, no assist needed, no difficulty walking.

## 2019-12-06 NOTE — ED Notes (Signed)
Breakfast Ordered 

## 2019-12-06 NOTE — ED Notes (Addendum)
Pt finished with TTS. Pt to nursing station to call son.

## 2019-12-06 NOTE — BH Assessment (Signed)
Patient was seen for re-assessment.  Patient denies SI/HI/Psychosis and is requesting discharge.  Patient states that he has been in a hospital bed for a week now and he states that he feels like it is causing more harm for him than good.  Patient states that he would like to be at home with his family preparing for the holidays.  He states, "I know I am depressed and I want to get some help, but this is making things worse for me.  Patient states, "I was depressed and called for help because I was alone and my son was gout of town. "I felt like I could kill myself because I was having a hard time trying to get the help I needed, but I never told anyone I put a loaded gun to my head.  What I said was that I could see why people would kill themselves trying to get help for themselves because of trying to navigate the system."  TTS asked for permission to contact patient's son for his thought on patient being discharged.  Patient allowed TTS to contact his son Khairi Garman [phone].  Son states that patient has experienced a rough two years and he has been increasingly depressed.  He states that his father told him 2 months ago that he was not able to get his knee fix that he would just kill himself.  Son states that he is not sure what to think about this situation.  He states that his father has never attempted suicide in the past and he states that he has removed all the guns from his home.  However, he states that there would be nothing to stop his father from going to buy another gun if he was intent on killing himself.He states that he wants to believe that his father would never do that and he does believe that being around his family would help his father and that him not having contact with them is probably making things worse.  However, he states that he does not feel secure in saying that it would be better for his father to come home without getting help for him first.  TTS staffed case with Oneida Alar, NP, who feels like patient still needs geriatric psych placement.

## 2019-12-06 NOTE — ED Notes (Signed)
Patient refusing cbg and insulin.

## 2019-12-06 NOTE — ED Notes (Signed)
Pt denies SI/HI, denies hallucinations. Pt eating breakfast. States that he normally wears hearing aids but doesn't have them with him. Pt calm and cooperative.

## 2019-12-06 NOTE — ED Notes (Signed)
Pt given clean clothes and in shower. Bed linens changed.

## 2019-12-07 ENCOUNTER — Emergency Department (HOSPITAL_COMMUNITY): Payer: HMO

## 2019-12-07 DIAGNOSIS — J029 Acute pharyngitis, unspecified: Secondary | ICD-10-CM | POA: Diagnosis not present

## 2019-12-07 DIAGNOSIS — I509 Heart failure, unspecified: Secondary | ICD-10-CM | POA: Diagnosis not present

## 2019-12-07 LAB — URINALYSIS, ROUTINE W REFLEX MICROSCOPIC
Bilirubin Urine: NEGATIVE
Glucose, UA: >=500 mg/dL — AB
Hgb urine dipstick: NEGATIVE
Ketones, ur: NEGATIVE mg/dL
Leukocytes,Ua: NEGATIVE
Nitrite: NEGATIVE
Protein, ur: 30 mg/dL — AB
Specific Gravity, Urine: 1.014 (ref 1.005–1.030)
pH: 5 (ref 5.0–8.0)

## 2019-12-07 LAB — CBG MONITORING, ED
Glucose-Capillary: 253 mg/dL — ABNORMAL HIGH (ref 70–99)
Glucose-Capillary: 402 mg/dL — ABNORMAL HIGH (ref 70–99)
Glucose-Capillary: 337 mg/dL — ABNORMAL HIGH (ref 70–99)
Glucose-Capillary: 274 mg/dL — ABNORMAL HIGH (ref 70–99)
Glucose-Capillary: 156 mg/dL — ABNORMAL HIGH (ref 70–99)

## 2019-12-07 LAB — RESP PANEL BY RT-PCR (FLU A&B, COVID) ARPGX2
Influenza A by PCR: NEGATIVE
Influenza B by PCR: NEGATIVE
SARS Coronavirus 2 by RT PCR: NEGATIVE

## 2019-12-07 MED ORDER — APIXABAN 5 MG PO TABS
5.0000 mg | ORAL_TABLET | Freq: Two times a day (BID) | ORAL | Status: DC
  Administered 2019-12-07 – 2019-12-09 (×4): 5 mg via ORAL
  Filled 2019-12-07 (×4): qty 1

## 2019-12-07 MED ORDER — TRAZODONE HCL 100 MG PO TABS
100.0000 mg | ORAL_TABLET | Freq: Every day | ORAL | Status: DC
  Administered 2019-12-07 – 2019-12-08 (×3): 100 mg via ORAL
  Filled 2019-12-07 (×2): qty 1
  Filled 2019-12-07: qty 2

## 2019-12-07 NOTE — ED Notes (Signed)
Pt speaking with his son via telephone.  Pt's son Merrily Pew) updated on pt's status.

## 2019-12-07 NOTE — ED Notes (Signed)
Lunch Tray Ordered @ 1039. 

## 2019-12-07 NOTE — ED Notes (Signed)
Patient became agitated with nurse after he was told that he was not getting placed tonight at [facility]. Verbal  deescalation was used. Patient is now in bed and trying to sleep.

## 2019-12-07 NOTE — Discharge Planning (Signed)
RNCM received call from pt son regarding disposition plan for his dad.  Son states he was told pt was being transferred to [place] on yesterday and have not heard back regarding alternate plan.  RNCM advised to call back after AM rounds as new plan may be in place and we will find out more at that time.

## 2019-12-07 NOTE — Progress Notes (Signed)
Inpatient Diabetes Program Recommendations  AACE/ADA: New Consensus Statement on Inpatient Glycemic Control   Target Ranges:  Prepandial:   less than 140 mg/dL      Peak postprandial:   less than 180 mg/dL (1-2 hours)      Critically ill patients:  140 - 180 mg/dL   Results for [name] [name], [name] [name] (MRN [id number]) as of 12/07/2019 14:02  Ref. Range 12/06/2019 07:59 12/06/2019 12:10 12/06/2019 16:33 12/06/2019 22:32 12/07/2019 08:56 12/07/2019 12:40  Glucose-Capillary Latest Ref Range: 70 - 99 mg/dL 241 (H) 213 (H) 119 (H) 264 (H) 253 (H) 402 (H)   Review of Glycemic Control  Diabetes history: DM2 Outpatient Diabetes medications: Lantus 13 units QHS Current orders for Inpatient glycemic control: Lantus 13 units daily, Novolog 0-20 units TID with meals, Novolog 0-5 units QHS  Inpatient Diabetes Program Recommendations:    Insulin: Please consider increasing Lantus to 16 units daily.  NOTE: Glucose 253 mg/dl this morning at 8:56 am and no Novolog or Lantus given this morning. Glucose up to 402 mg/dl at 12:40 today and Novolog 20 units given at 12:52. Sent communication to K. Cobb, RN to ask that RN be sure patient receives Lantus today (not charted against on MAR).   Thanks, Barnie Alderman, RN, MSN, CDE Diabetes Coordinator Inpatient Diabetes Program [phone] (Team Pager from 8am to 5pm)

## 2019-12-07 NOTE — ED Notes (Signed)
Dinner Trays Ordered @ [phone].

## 2019-12-07 NOTE — Progress Notes (Signed)
CSW left a voice message with pt's son, Kable Haywood ([phone]) requesting a return call. Pt's RN informed this Probation officer that pt's son would like to speak with a Education officer, museum.   Audree Camel, LCSW, [place] Social Worker II Disposition CSW [phone]

## 2019-12-07 NOTE — Progress Notes (Signed)
CSW received a call from patients son, [name] [name], [phone], inquiring about updates.  CSW advised son that patient will be accepted to [facility]) once his sugar levels drop into the 200's.  [facility] has received all the additional information requested. CSW advised son he would follow up with him tonight if he was officially accepted.  If not someone would follow up in the A.M. or he could call for updates.  [place] Disposition [phone] (cell)

## 2019-12-07 NOTE — Progress Notes (Signed)
CSW received a call from [facility] stating they are considering this patient for placement.  To accept him, his Blood Sugars will have to be in the 200's.  Notifying ED nurse.  [place] Disposition [phone] (cell)

## 2019-12-07 NOTE — ED Notes (Signed)
Pt ate 100% of his dinner tray.  Pt resting at this time watching television

## 2019-12-07 NOTE — Progress Notes (Signed)
CSW received phone call from [place] in admissions at [facility]. They are reviewing pt and request a Covid test, chest x-ray, uranalysis, and EKG. Santiago Glad at [facility] ED notified. Pt's IVC paperwork (front and back) should be faxed to [place] at [phone].   Audree Camel, LCSW, [place] Social Worker II Disposition CSW [phone]

## 2019-12-07 NOTE — ED Notes (Signed)
Spoke to behavioral health and they are still looking for placement for patient.

## 2019-12-07 NOTE — Progress Notes (Signed)
CSW faxed updated glucose levels to [facility] admissions for review.  Csw to follow up with admission on acceptance.  [place] Disposition [phone] (cell)

## 2019-12-08 LAB — CBG MONITORING, ED
Glucose-Capillary: 222 mg/dL — ABNORMAL HIGH (ref 70–99)
Glucose-Capillary: 201 mg/dL — ABNORMAL HIGH (ref 70–99)
Glucose-Capillary: 170 mg/dL — ABNORMAL HIGH (ref 70–99)
Glucose-Capillary: 212 mg/dL — ABNORMAL HIGH (ref 70–99)

## 2019-12-08 MED ORDER — POLYETHYLENE GLYCOL 3350 17 G PO PACK
17.0000 g | PACK | Freq: Every day | ORAL | Status: DC
  Administered 2019-12-08 – 2019-12-09 (×2): 17 g via ORAL
  Filled 2019-12-08 (×2): qty 1

## 2019-12-08 NOTE — ED Provider Notes (Signed)
Emergency Medicine Observation Re-evaluation Note  [name] [name] is a 64 y.o. male, seen on rounds today.  Pt initially presented to the ED for complaints of Otalgia and Sore Throat Currently, the patient is awaiting Psych placement.  Pt upset about lack of bed placement   Physical Exam  BP 111/73 (BP Location: Right Arm)   Pulse (!) 53   Temp 97.6 F (36.4 C) (Oral)   Resp 16   Ht 6\' 2"  (1.88 m)   Wt 63.5 kg   SpO2 99%   BMI 17.97 kg/m  Physical Exam General: wdwn Cardiac:  Lungs:  Psych:   ED Course / MDM  EKG:EKG Interpretation  Date/Time:  Thursday December 03 2019 20:47:21 EST Ventricular Rate:  61 PR Interval:    QRS Duration: 92 QT Interval:  488 QTC Calculation: 491 R Axis:   65 Text Interpretation: Atrial fibrillation Nonspecific T wave abnormality Prolonged QT Abnormal ECG When compared with ECG of EARLIER SAME DATE No significant change was found Confirmed by Delora Fuel ([id number]) on 12/04/2019 12:15:22 AM  Clinical Course as of Dec 08 1407  Thu Dec 03, 2019  1708 Glucose(!!): 503 [MV]  1713 WBC: 4.8 [MV]  1751 Group A Strep by PCR: NOT DETECTED [MV]    Clinical Course User Index [MV] Eustaquio Maize, PA-C   I have reviewed the labs performed to date as well as medications administered while in observation.  Recent changes in the last 24 hours include   Plan  Current plan is for geriatric psych  Patient  under full IVC at this time.   Fransico Meadow, [place] 12/08/19 1412    Noemi Chapel, MD 12/09/19 [phone]

## 2019-12-08 NOTE — ED Notes (Signed)
Lunch Tray Ordered @ 1036. 

## 2019-12-08 NOTE — Progress Notes (Addendum)
CSW spoke with pt's son [name] [name] [phone]). He is concerned that his father has been in the ED for several days and has not been accepted at a gero-psych hospital. He states that he has talked to a lawyer about his concerns. CSW shared that [facility] is continuing to review pt and will likely accept pt today if his glucose levels stay in the 200s.   Mary in admissions at [facility] stated that they are reviewing pt's glucose levels for a 24 hour period and that this period will end "around lunch time today".   Pt's son was notified.   Disposition will continue to follow.   Audree Camel, MSW, LCSW, LCAS Clinical Social Worker II Disposition CSW [phone]    UPDATE 105pm: CSW called [name] in admissions at [facility] to verify that she had received the fax sent with pt's updated blood glucose levels. [name] stated that, due to an unforseen issue, there would be no discharges today and thus, no male beds available. She assured this Probation officer that [facility] will continue to review pt and requested that this writer call back tomorrow.

## 2019-12-08 NOTE — Progress Notes (Signed)
CSW received phone call from [facility] in admissions at [place]. She requests that pt's blood glucose be checked at dinner and before pt goes to sleep. She is hopeful that pt will be accepted to [facility] tomorrow. [name] also stated that she will know about tomorrow's discharges at 430pm today. 2nd shift disposition CSW will be asked to call at 5pm, if his scheduled allows to inquire about tomorrow's bed availability.   CSW has re-faxed pt's referral information to the following hospitals to review: [place], [place], [place] Worker II Disposition CSW [phone]

## 2019-12-08 NOTE — Progress Notes (Signed)
CSW attempted to call [facility] Admissions, [phone] and received no answer.  After multiple attempts CSW spoke with someone who stated [name] had left for the evening and that there was no one working admissions for the rest of the evening.  CSW to follow up in the morning (12/08/2019).  [place] Disposition [phone] (cell)

## 2019-12-09 DIAGNOSIS — F339 Major depressive disorder, recurrent, unspecified: Secondary | ICD-10-CM | POA: Insufficient documentation

## 2019-12-09 DIAGNOSIS — F331 Major depressive disorder, recurrent, moderate: Secondary | ICD-10-CM | POA: Insufficient documentation

## 2019-12-09 LAB — CBG MONITORING, ED
Glucose-Capillary: 147 mg/dL — ABNORMAL HIGH (ref 70–99)
Glucose-Capillary: 202 mg/dL — ABNORMAL HIGH (ref 70–99)
Glucose-Capillary: 167 mg/dL — ABNORMAL HIGH (ref 70–99)

## 2019-12-09 NOTE — ED Notes (Signed)
Attempted to call [employer] for transport. [place] communications stated that I needed to call [facility] at [phone]. Called [facility] and got recording for [facility] transport but not a love person. Left message.

## 2019-12-09 NOTE — Progress Notes (Signed)
Pt accepted to [facility], bed 414     Dr. Jerene Dilling is the accepting/attending provider.    Call report to [phone]   Joe @ [facility] ED notified.     Pt is under IVC and will be transported by law enforcement  Pt is scheduled to arrive at [facility] as soon as transportation can be arranged.    Audree Camel, MSW, LCSW, [place] Clinical Social Worker II Disposition CSW [phone]

## 2019-12-09 NOTE — Progress Notes (Signed)
CSW spoke with [name] [name] at [place]. Pt is accepted to their facility pending a discharge today. She states that acceptance information will likely be given before 2pm today.   Pt's son [name] updated.    Audree Camel, MSW, LCSW, [place] Clinical Social Worker II Disposition CSW [phone]

## 2019-12-09 NOTE — ED Provider Notes (Signed)
This is a 64 year old male currently under IVC with current plan for geriatric psych admission.  Patient was seen on rounds this morning.  At this time he is resting comfortably sleeping with the lights off.  Nurse aide mentioned that he was calm and cooperative overnight.  BP 101/68 (BP Location: Right Arm)   Pulse (!) 58   Temp 98.8 F (37.1 C) (Oral)   Resp 16   Ht 6\' 2"  (1.88 m)   Wt 63.5 kg   SpO2 96%   BMI 17.97 kg/m   Results for orders placed or performed during the hospital encounter of 12/03/19  Resp Panel by RT-PCR (Flu A&B, Covid) Nasopharyngeal Swab   Specimen: Nasopharyngeal Swab; Nasopharyngeal(NP) swabs in vial transport medium  Result Value Ref Range   SARS Coronavirus 2 by RT PCR NEGATIVE NEGATIVE   Influenza A by PCR NEGATIVE NEGATIVE   Influenza B by PCR NEGATIVE NEGATIVE  Group A Strep by PCR   Specimen: Nasopharyngeal Swab; Sterile Swab  Result Value Ref Range   Group A Strep by PCR NOT DETECTED NOT DETECTED  Resp Panel by RT-PCR (Flu A&B, Covid) Nasopharyngeal Swab   Specimen: Nasopharyngeal Swab; Nasopharyngeal(NP) swabs in vial transport medium  Result Value Ref Range   SARS Coronavirus 2 by RT PCR NEGATIVE NEGATIVE   Influenza A by PCR NEGATIVE NEGATIVE   Influenza B by PCR NEGATIVE NEGATIVE  Comprehensive metabolic panel  Result Value Ref Range   Sodium 130 (L) 135 - 145 mmol/L   Potassium 4.9 3.5 - 5.1 mmol/L   Chloride 89 (L) 98 - 111 mmol/L   CO2 27 22 - 32 mmol/L   Glucose, Bld 503 (HH) 70 - 99 mg/dL   BUN 15 8 - 23 mg/dL   Creatinine, Ser 0.74 0.61 - 1.24 mg/dL   Calcium 9.5 8.9 - 10.3 mg/dL   Total Protein 6.4 (L) 6.5 - 8.1 g/dL   Albumin 3.7 3.5 - 5.0 g/dL   AST 14 (L) 15 - 41 U/L   ALT 10 0 - 44 U/L   Alkaline Phosphatase 78 38 - 126 U/L   Total Bilirubin 1.1 0.3 - 1.2 mg/dL   GFR, Estimated >60 >60 mL/min   Anion gap 14 5 - 15  Ethanol  Result Value Ref Range   Alcohol, Ethyl (B) <10 <10 mg/dL  Urine rapid drug screen (hosp  performed)  Result Value Ref Range   Opiates NONE DETECTED NONE DETECTED   Cocaine NONE DETECTED NONE DETECTED   Benzodiazepines NONE DETECTED NONE DETECTED   Amphetamines NONE DETECTED NONE DETECTED   Tetrahydrocannabinol NONE DETECTED NONE DETECTED   Barbiturates NONE DETECTED NONE DETECTED  CBC with Diff  Result Value Ref Range   WBC 4.8 4.0 - 10.5 K/uL   RBC 4.27 4.22 - 5.81 MIL/uL   Hemoglobin 13.7 13.0 - 17.0 g/dL   HCT 40.6 39 - 52 %   MCV 95.1 80.0 - 100.0 fL   MCH 32.1 26.0 - 34.0 pg   MCHC 33.7 30.0 - 36.0 g/dL   RDW 13.1 11.5 - 15.5 %   Platelets 202 150 - 400 K/uL   nRBC 0.0 0.0 - 0.2 %   Neutrophils Relative % 64 %   Neutro Abs 3.0 1.7 - 7.7 K/uL   Lymphocytes Relative 23 %   Lymphs Abs 1.1 0.7 - 4.0 K/uL   Monocytes Relative 12 %   Monocytes Absolute 0.6 0.1 - 1.0 K/uL   Eosinophils Relative 1 %   Eosinophils Absolute  0.0 0.0 - 0.5 K/uL   Basophils Relative 0 %   Basophils Absolute 0.0 0.0 - 0.1 K/uL   Immature Granulocytes 0 %   Abs Immature Granulocytes 0.01 1.60 - 7.37 K/uL  Salicylate level  Result Value Ref Range   Salicylate Lvl <1.0 (L) 7.0 - 30.0 mg/dL  Acetaminophen level  Result Value Ref Range   Acetaminophen (Tylenol), Serum <10 (L) 10 - 30 ug/mL  CBC with Differential  Result Value Ref Range   WBC 4.9 4.0 - 10.5 K/uL   RBC 3.74 (L) 4.22 - 5.81 MIL/uL   Hemoglobin 12.2 (L) 13.0 - 17.0 g/dL   HCT 36.0 (L) 39 - 52 %   MCV 96.3 80.0 - 100.0 fL   MCH 32.6 26.0 - 34.0 pg   MCHC 33.9 30.0 - 36.0 g/dL   RDW 13.3 11.5 - 15.5 %   Platelets 183 150 - 400 K/uL   nRBC 0.0 0.0 - 0.2 %   Neutrophils Relative % 52 %   Neutro Abs 2.6 1.7 - 7.7 K/uL   Lymphocytes Relative 34 %   Lymphs Abs 1.7 0.7 - 4.0 K/uL   Monocytes Relative 9 %   Monocytes Absolute 0.5 0.1 - 1.0 K/uL   Eosinophils Relative 3 %   Eosinophils Absolute 0.1 0.0 - 0.5 K/uL   Basophils Relative 1 %   Basophils Absolute 0.0 0.0 - 0.1 K/uL   Immature Granulocytes 1 %   Abs Immature  Granulocytes 0.03 0.00 - 0.07 K/uL  Comprehensive metabolic panel  Result Value Ref Range   Sodium 133 (L) 135 - 145 mmol/L   Potassium 4.0 3.5 - 5.1 mmol/L   Chloride 98 98 - 111 mmol/L   CO2 28 22 - 32 mmol/L   Glucose, Bld 232 (H) 70 - 99 mg/dL   BUN 16 8 - 23 mg/dL   Creatinine, Ser 0.60 (L) 0.61 - 1.24 mg/dL   Calcium 9.0 8.9 - 10.3 mg/dL   Total Protein 5.3 (L) 6.5 - 8.1 g/dL   Albumin 2.8 (L) 3.5 - 5.0 g/dL   AST 17 15 - 41 U/L   ALT 10 0 - 44 U/L   Alkaline Phosphatase 43 38 - 126 U/L   Total Bilirubin 0.6 0.3 - 1.2 mg/dL   GFR, Estimated >60 >60 mL/min   Anion gap 7 5 - 15  Urinalysis, Routine w reflex microscopic Nasopharyngeal Swab  Result Value Ref Range   Color, Urine YELLOW YELLOW   APPearance CLEAR CLEAR   Specific Gravity, Urine 1.014 1.005 - 1.030   pH 5.0 5.0 - 8.0   Glucose, UA >=500 (A) NEGATIVE mg/dL   Hgb urine dipstick NEGATIVE NEGATIVE   Bilirubin Urine NEGATIVE NEGATIVE   Ketones, ur NEGATIVE NEGATIVE mg/dL   Protein, ur 30 (A) NEGATIVE mg/dL   Nitrite NEGATIVE NEGATIVE   Leukocytes,Ua NEGATIVE NEGATIVE   RBC / HPF 0-5 0 - 5 RBC/hpf   WBC, UA 0-5 0 - 5 WBC/hpf   Bacteria, UA RARE (A) NONE SEEN   Mucus PRESENT    Hyaline Casts, UA PRESENT   CBG monitoring, ED  Result Value Ref Range   Glucose-Capillary 400 (H) 70 - 99 mg/dL  I-Stat venous blood gas, [facility] ED)  Result Value Ref Range   pH, Ven 7.403 7.25 - 7.43   pCO2, Ven 46.0 44 - 60 mmHg   pO2, Ven 20.0 (LL) 32 - 45 mmHg   Bicarbonate 28.7 (H) 20.0 - 28.0 mmol/L   TCO2 30 22 -  32 mmol/L   O2 Saturation 31.0 %   Acid-Base Excess 3.0 (H) 0.0 - 2.0 mmol/L   Sodium 130 (L) 135 - 145 mmol/L   Potassium 4.9 3.5 - 5.1 mmol/L   Calcium, Ion 1.19 1.15 - 1.40 mmol/L   HCT 42.0 39 - 52 %   Hemoglobin 14.3 13.0 - 17.0 g/dL   Sample type VENOUS    Comment NOTIFIED PHYSICIAN   POC CBG, ED  Result Value Ref Range   Glucose-Capillary 352 (H) 70 - 99 mg/dL  CBG monitoring, ED  Result Value Ref Range    Glucose-Capillary 313 (H) 70 - 99 mg/dL  CBG monitoring, ED  Result Value Ref Range   Glucose-Capillary 335 (H) 70 - 99 mg/dL  CBG monitoring, ED  Result Value Ref Range   Glucose-Capillary 266 (H) 70 - 99 mg/dL  CBG monitoring, ED  Result Value Ref Range   Glucose-Capillary 333 (H) 70 - 99 mg/dL  CBG monitoring, ED  Result Value Ref Range   Glucose-Capillary 320 (H) 70 - 99 mg/dL  CBG monitoring, ED  Result Value Ref Range   Glucose-Capillary 247 (H) 70 - 99 mg/dL  CBG monitoring, ED  Result Value Ref Range   Glucose-Capillary 402 (H) 70 - 99 mg/dL  CBG monitoring, ED  Result Value Ref Range   Glucose-Capillary 389 (H) 70 - 99 mg/dL  CBG monitoring, ED  Result Value Ref Range   Glucose-Capillary 265 (H) 70 - 99 mg/dL  CBG monitoring, ED  Result Value Ref Range   Glucose-Capillary 241 (H) 70 - 99 mg/dL  CBG monitoring, ED  Result Value Ref Range   Glucose-Capillary 213 (H) 70 - 99 mg/dL  CBG monitoring, ED  Result Value Ref Range   Glucose-Capillary 119 (H) 70 - 99 mg/dL  CBG monitoring, ED  Result Value Ref Range   Glucose-Capillary 264 (H) 70 - 99 mg/dL   Comment 1 Notify RN   CBG monitoring, ED  Result Value Ref Range   Glucose-Capillary 253 (H) 70 - 99 mg/dL  CBG monitoring, ED  Result Value Ref Range   Glucose-Capillary 402 (H) 70 - 99 mg/dL  CBG monitoring, ED  Result Value Ref Range   Glucose-Capillary 337 (H) 70 - 99 mg/dL  CBG monitoring, ED  Result Value Ref Range   Glucose-Capillary 274 (H) 70 - 99 mg/dL  CBG monitoring, ED  Result Value Ref Range   Glucose-Capillary 156 (H) 70 - 99 mg/dL  CBG monitoring, ED  Result Value Ref Range   Glucose-Capillary 222 (H) 70 - 99 mg/dL  CBG monitoring, ED  Result Value Ref Range   Glucose-Capillary 201 (H) 70 - 99 mg/dL  CBG monitoring, ED  Result Value Ref Range   Glucose-Capillary 170 (H) 70 - 99 mg/dL  CBG monitoring, ED  Result Value Ref Range   Glucose-Capillary 212 (H) 70 - 99 mg/dL  CBG  monitoring, ED  Result Value Ref Range   Glucose-Capillary 147 (H) 70 - 99 mg/dL   DG Chest 2 View  Result Date: 12/07/2019 CLINICAL DATA:  Sore throat EXAM: CHEST - 2 VIEW COMPARISON:  02/01/2010 FINDINGS: Postop CABG. Heart size normal. Vascularity normal. Negative for heart failure. Lungs well aerated and clear.  No infiltrate or effusion. IMPRESSION: No active cardiopulmonary disease. Electronically Signed   By: Franchot Gallo M.D.   On: 12/07/2019 10:15   CT Soft Tissue Neck W Contrast  Result Date: 12/03/2019 CLINICAL DATA:  Sore throat difficulty swallowing. Rule out deep space  infection. EXAM: CT NECK WITH CONTRAST TECHNIQUE: Multidetector CT imaging of the neck was performed using the standard protocol following the bolus administration of intravenous contrast. CONTRAST:  52mL OMNIPAQUE IOHEXOL 300 MG/ML  SOLN COMPARISON:  None. FINDINGS: Pharynx and larynx: Normal. No mass or swelling. Salivary glands: No inflammation, mass, or stone. Thyroid: Negative Lymph nodes: No enlarged lymph nodes in the neck. Vascular: Normal vascular enhancement. Atherosclerotic disease of the carotid bifurcation bilaterally. Atherosclerotic calcification aortic arch. Limited intracranial: Negative Visualized orbits: Not included Mastoids and visualized paranasal sinuses: Paranasal sinuses clear. Mastoid clear. Skeleton: Mild cervical spondylosis. Foraminal narrowing bilaterally C5-6 and C6-7 due to spurring. Median sternotomy wires. Upper chest: Lung apices clear bilaterally. Other: None IMPRESSION: No pharyngeal edema. Negative for pharyngeal abscess. Normal airway. Negative for mass or adenopathy in the neck. Electronically Signed   By: Franchot Gallo M.D.   On: 12/03/2019 19:23      Domenic Moras, PA-C 12/09/19 0940    Maudie Flakes, MD 12/09/19 [phone]

## 2019-12-09 NOTE — ED Notes (Signed)
Pt was eating a hot meal when [facility] arrived for transport. Pt and [facility]  Were ready to go. No temp taken. Pt was A&O x 3, ambulatory and felt normal to touch ion last set of vitals. Pt's hearing aids were also located in pants pockets and placed back in Pt's hearing aid case. All was done in front of Pt who also signed for his property.

## 2019-12-09 NOTE — ED Notes (Signed)
When Pt is awaoken she comes to the doorway, shouts cries and then goes back to bed. Presently she is resting

## 2019-12-09 NOTE — ED Notes (Signed)
Breakfast ordered 

## 2019-12-10 ENCOUNTER — Telehealth (HOSPITAL_COMMUNITY): Payer: Self-pay

## 2019-12-10 DIAGNOSIS — F332 Major depressive disorder, recurrent severe without psychotic features: Secondary | ICD-10-CM | POA: Diagnosis not present

## 2019-12-10 DIAGNOSIS — E1165 Type 2 diabetes mellitus with hyperglycemia: Secondary | ICD-10-CM | POA: Diagnosis not present

## 2019-12-10 DIAGNOSIS — I10 Essential (primary) hypertension: Secondary | ICD-10-CM | POA: Diagnosis not present

## 2019-12-10 DIAGNOSIS — I25810 Atherosclerosis of coronary artery bypass graft(s) without angina pectoris: Secondary | ICD-10-CM | POA: Diagnosis not present

## 2019-12-10 DIAGNOSIS — E782 Mixed hyperlipidemia: Secondary | ICD-10-CM | POA: Diagnosis not present

## 2019-12-10 NOTE — Telephone Encounter (Signed)
Reached out to patient to see if he would like to schedule ED f/u. No answer left voicemail.

## 2019-12-11 DIAGNOSIS — F332 Major depressive disorder, recurrent severe without psychotic features: Secondary | ICD-10-CM | POA: Diagnosis not present

## 2019-12-12 DIAGNOSIS — I25810 Atherosclerosis of coronary artery bypass graft(s) without angina pectoris: Secondary | ICD-10-CM | POA: Diagnosis not present

## 2019-12-12 DIAGNOSIS — F332 Major depressive disorder, recurrent severe without psychotic features: Secondary | ICD-10-CM | POA: Diagnosis not present

## 2019-12-12 DIAGNOSIS — I10 Essential (primary) hypertension: Secondary | ICD-10-CM | POA: Diagnosis not present

## 2019-12-12 DIAGNOSIS — E1165 Type 2 diabetes mellitus with hyperglycemia: Secondary | ICD-10-CM | POA: Diagnosis not present

## 2019-12-12 DIAGNOSIS — E782 Mixed hyperlipidemia: Secondary | ICD-10-CM | POA: Diagnosis not present

## 2019-12-13 DIAGNOSIS — F332 Major depressive disorder, recurrent severe without psychotic features: Secondary | ICD-10-CM | POA: Diagnosis not present

## 2019-12-14 DIAGNOSIS — M25561 Pain in right knee: Secondary | ICD-10-CM | POA: Diagnosis not present

## 2019-12-14 DIAGNOSIS — E1169 Type 2 diabetes mellitus with other specified complication: Secondary | ICD-10-CM | POA: Diagnosis not present

## 2019-12-14 DIAGNOSIS — Z9889 Other specified postprocedural states: Secondary | ICD-10-CM | POA: Diagnosis not present

## 2019-12-14 DIAGNOSIS — F332 Major depressive disorder, recurrent severe without psychotic features: Secondary | ICD-10-CM | POA: Diagnosis not present

## 2019-12-14 DIAGNOSIS — I25810 Atherosclerosis of coronary artery bypass graft(s) without angina pectoris: Secondary | ICD-10-CM | POA: Diagnosis not present

## 2019-12-15 DIAGNOSIS — F332 Major depressive disorder, recurrent severe without psychotic features: Secondary | ICD-10-CM | POA: Diagnosis not present

## 2019-12-17 ENCOUNTER — Ambulatory Visit: Payer: HMO | Admitting: Endocrinology

## 2019-12-21 ENCOUNTER — Ambulatory Visit: Payer: Self-pay

## 2019-12-21 ENCOUNTER — Telehealth: Payer: Self-pay | Admitting: Pharmacist

## 2019-12-21 NOTE — Chronic Care Management (AMB) (Signed)
Appointment was canceled.

## 2019-12-22 ENCOUNTER — Telehealth: Payer: HMO

## 2019-12-22 ENCOUNTER — Telehealth: Payer: Self-pay | Admitting: Internal Medicine

## 2019-12-22 NOTE — Progress Notes (Signed)
°  Chronic Care Management   Outreach Note  12/22/2019 Name: Avion Kutzer MRN: [id number] DOB: [birth year]  Referred by: Isaac Bliss, Rayford Halsted, MD Reason for referral : No chief complaint on file.   An unsuccessful telephone outreach was attempted today. The patient was referred to the pharmacist for assistance with care management and care coordination.   Follow Up Plan:   Carley Perdue [employer] Scheduler

## 2019-12-28 ENCOUNTER — Telehealth: Payer: Self-pay | Admitting: Internal Medicine

## 2019-12-28 NOTE — Progress Notes (Signed)
  Chronic Care Management   Note  12/28/2019 Name: [name] [name] MRN: [id number] DOB: 05-31-1955  [name] [name] is a 64 y.o. year old male who is a primary care patient of Philip Aspen, Limmie Patricia, MD. I reached out to [name] by phone today in response to a referral sent by [name]'s PCP, Philip Aspen, Limmie Patricia, MD.   [name] was given information about Chronic Care Management services today including:  1. CCM service includes personalized support from designated clinical staff supervised by his physician, including individualized plan of care and coordination with other care providers 2. 24/7 contact phone numbers for assistance for urgent and routine care needs. 3. Service will only be billed when office clinical staff spend 20 minutes or more in a month to coordinate care. 4. Only one practitioner may furnish and bill the service in a calendar month. 5. The patient may stop CCM services at any time (effective at the end of the month) by phone call to the office staff.   Patient agreed to services and verbal consent obtained.   Follow up plan:   Carley Perdue [employer] Scheduler

## 2020-01-05 ENCOUNTER — Telehealth: Payer: Self-pay | Admitting: Internal Medicine

## 2020-01-05 NOTE — Telephone Encounter (Signed)
Yes, I will see him.

## 2020-01-05 NOTE — Telephone Encounter (Signed)
Left message for patient to call for appointment

## 2020-01-05 NOTE — Telephone Encounter (Signed)
Ok with me 

## 2020-01-05 NOTE — Telephone Encounter (Signed)
   Patient requesting TOC appointment, patient feels he would better suited with male provider

## 2020-01-13 ENCOUNTER — Encounter: Payer: Self-pay | Admitting: Internal Medicine

## 2020-01-13 ENCOUNTER — Other Ambulatory Visit: Payer: Self-pay

## 2020-01-13 ENCOUNTER — Ambulatory Visit (INDEPENDENT_AMBULATORY_CARE_PROVIDER_SITE_OTHER): Payer: HMO | Admitting: Internal Medicine

## 2020-01-13 VITALS — BP 118/76 | HR 76 | Temp 98.3°F | Resp 16 | Ht 74.0 in | Wt 162.0 lb

## 2020-01-13 DIAGNOSIS — E113299 Type 2 diabetes mellitus with mild nonproliferative diabetic retinopathy without macular edema, unspecified eye: Secondary | ICD-10-CM | POA: Diagnosis not present

## 2020-01-13 DIAGNOSIS — I25812 Atherosclerosis of bypass graft of coronary artery of transplanted heart without angina pectoris: Secondary | ICD-10-CM | POA: Insufficient documentation

## 2020-01-13 DIAGNOSIS — Z23 Encounter for immunization: Secondary | ICD-10-CM

## 2020-01-13 DIAGNOSIS — I480 Paroxysmal atrial fibrillation: Secondary | ICD-10-CM | POA: Diagnosis not present

## 2020-01-13 DIAGNOSIS — I739 Peripheral vascular disease, unspecified: Secondary | ICD-10-CM | POA: Diagnosis not present

## 2020-01-13 DIAGNOSIS — N400 Enlarged prostate without lower urinary tract symptoms: Secondary | ICD-10-CM | POA: Insufficient documentation

## 2020-01-13 DIAGNOSIS — F3341 Major depressive disorder, recurrent, in partial remission: Secondary | ICD-10-CM | POA: Diagnosis not present

## 2020-01-13 DIAGNOSIS — E1165 Type 2 diabetes mellitus with hyperglycemia: Secondary | ICD-10-CM

## 2020-01-13 DIAGNOSIS — E559 Vitamin D deficiency, unspecified: Secondary | ICD-10-CM | POA: Diagnosis not present

## 2020-01-13 DIAGNOSIS — E785 Hyperlipidemia, unspecified: Secondary | ICD-10-CM | POA: Diagnosis not present

## 2020-01-13 DIAGNOSIS — D539 Nutritional anemia, unspecified: Secondary | ICD-10-CM

## 2020-01-13 DIAGNOSIS — I10 Essential (primary) hypertension: Secondary | ICD-10-CM | POA: Diagnosis not present

## 2020-01-13 DIAGNOSIS — Z0001 Encounter for general adult medical examination with abnormal findings: Secondary | ICD-10-CM | POA: Insufficient documentation

## 2020-01-13 DIAGNOSIS — D649 Anemia, unspecified: Secondary | ICD-10-CM | POA: Insufficient documentation

## 2020-01-13 MED ORDER — MIRTAZAPINE 15 MG PO TABS: 15.0000 mg | ORAL_TABLET | Freq: Every day | ORAL | 1 refills | Status: DC

## 2020-01-13 MED ORDER — PAROXETINE HCL 20 MG PO TABS: 20.0000 mg | ORAL_TABLET | Freq: Every day | ORAL | 1 refills | Status: DC

## 2020-01-13 MED ORDER — TRAZODONE HCL 50 MG PO TABS: 50.0000 mg | ORAL_TABLET | Freq: Every day | ORAL | 1 refills | Status: DC

## 2020-01-13 MED ORDER — ATORVASTATIN CALCIUM 40 MG PO TABS: 40.0000 mg | ORAL_TABLET | Freq: Every day | ORAL | 1 refills | Status: DC

## 2020-01-13 MED ORDER — APIXABAN 5 MG PO TABS
5.0000 mg | ORAL_TABLET | Freq: Two times a day (BID) | ORAL | 0 refills | Status: DC
Start: 2020-01-13 — End: 2020-03-16

## 2020-01-13 NOTE — Patient Instructions (Signed)

## 2020-01-13 NOTE — Progress Notes (Signed)
Subjective:  Patient ID: [name] [name], male    DOB: [birth year]  Age: 65 y.o. MRN: [id number]  CC: Annual Exam, Anemia, Diabetes, Hyperlipidemia, Hypertension, Depression, and Atrial Fibrillation  This visit occurred during the SARS-CoV-2 public health emergency.  Safety protocols were in place, including screening questions prior to the visit, additional usage of staff PPE, and extensive cleaning of exam room while observing appropriate contact time as indicated for disinfecting solutions.   NEW TO ME  HPI [name] [name] presents for a CPX.  He was recently admitted and treated for DKA.  He feels like he intermittently gets dehydrated.  He was also found to have an episode of atrial fibrillation and was anemic.  He complains of weight loss and polys.  He denies any recent episodes of chest pain, palpitations, near-syncope, syncope, dizziness, or lightheadedness.  He is being treated for psychotic depression and tells me he is doing well on the current doses of paroxetine, mirtazapine, and trazodone.  He request refills on these.  He has a history of coronary artery disease but does not recall the last time he has seen a cardiologist.  He denies diaphoresis, chest pain, or dyspnea on exertion.  Outpatient Medications Prior to Visit  Medication Sig Dispense Refill  . B Complex Vitamins (B COMPLEX-B12 PO) Take 1 tablet by mouth daily.     . Cholecalciferol (VITAMIN D) 125 MCG (5000 UT) CAPS Take 5,000 Units by mouth daily.     . Continuous Blood Gluc Receiver (FREESTYLE LIBRE 14 DAY READER) DEVI 1 each by Does not apply route daily. 1 each 0  . Continuous Blood Gluc Sensor (FREESTYLE LIBRE 14 DAY SENSOR) MISC 1 each by Does not apply route daily. 2 each 5  . Insulin Aspart FlexPen 100 UNIT/ML SOPN INJECT 16 UNITS INTO THE SKIN THREE TIMES A DAY WITH MEALS (Patient taking differently: Inject 10-20 Units into the skin See admin instructions. Injects10-20 units into the skin three times  daily per sliding scale.) 15 mL 0  . insulin glargine (LANTUS) 100 UNIT/ML injection Inject 13 Units into the skin at bedtime.     . Insulin Pen Needle (DROPLET PEN NEEDLES) 32G X 4 MM MISC Use daily for glucose control 100 each 5  . lisinopril (ZESTRIL) 5 MG tablet TAKE 1 TABLET BY MOUTH DAILY (Patient taking differently: Take 5 mg by mouth daily.) 90 tablet 1  . aspirin 325 MG EC tablet Take 325 mg by mouth daily.    Marland Kitchen atorvastatin (LIPITOR) 40 MG tablet Take 40 mg by mouth daily.    Marland Kitchen gabapentin (NEURONTIN) 300 MG capsule Take 1 capsule (300 mg total) by mouth at bedtime. 90 capsule 1  . PARoxetine (PAXIL) 20 MG tablet Take 20 mg by mouth daily.    . traMADol (ULTRAM) 50 MG tablet Take 1 tablet (50 mg total) by mouth 2 (two) times daily as needed. 30 tablet 0  . apixaban (ELIQUIS) 5 MG TABS tablet Take 1 tablet (5 mg total) by mouth 2 (two) times daily. 60 tablet 0   No facility-administered medications prior to visit.    ROS Review of Systems  Constitutional: Positive for appetite change, fatigue and unexpected weight change (wt loss). Negative for chills, diaphoresis and fever.  HENT: Negative.   Eyes: Negative.   Respiratory: Negative.  Negative for cough, chest tightness, shortness of breath and wheezing.   Cardiovascular: Positive for leg swelling. Negative for chest pain and palpitations.  Gastrointestinal: Negative for abdominal pain, constipation, diarrhea,  nausea and vomiting.  Endocrine: Positive for polyuria. Negative for cold intolerance, heat intolerance, polydipsia and polyphagia.  Genitourinary: Negative.  Negative for difficulty urinating, hematuria, testicular pain and urgency.  Musculoskeletal: Negative for arthralgias, myalgias and neck pain.  Skin: Negative.  Negative for color change and pallor.  Neurological: Negative.  Negative for dizziness, weakness and light-headedness.  Hematological: Negative for adenopathy. Does not bruise/bleed easily.   Psychiatric/Behavioral: Positive for confusion, decreased concentration and dysphoric mood. Negative for agitation, behavioral problems, self-injury, sleep disturbance and suicidal ideas. The patient is not nervous/anxious and is not hyperactive.     Objective:  BP 118/76   Pulse 76   Temp 98.3 F (36.8 C) (Oral)   Resp 16   Ht 6\' 2"  (1.88 m)   Wt 162 lb (73.5 kg)   SpO2 96%   BMI 20.80 kg/m   BP Readings from Last 3 Encounters:  01/13/20 118/76  12/09/19 111/71  12/03/19 112/60    Wt Readings from Last 3 Encounters:  01/13/20 162 lb (73.5 kg)  12/03/19 140 lb (63.5 kg)  12/03/19 146 lb 11.2 oz (66.5 kg)    Physical Exam Vitals reviewed.  Constitutional:      Appearance: He is ill-appearing.  HENT:     Nose: Nose normal.     Mouth/Throat:     Mouth: Mucous membranes are moist.  Eyes:     General: No scleral icterus.    Conjunctiva/sclera: Conjunctivae normal.  Cardiovascular:     Rate and Rhythm: Normal rate and regular rhythm.     Pulses: Normal pulses.     Heart sounds: No murmur heard.   Pulmonary:     Effort: Pulmonary effort is normal.     Breath sounds: No stridor. No wheezing, rhonchi or rales.  Abdominal:     General: Abdomen is flat. Bowel sounds are normal. There is no distension.     Palpations: Abdomen is soft. There is no hepatomegaly, splenomegaly or mass.     Tenderness: There is no abdominal tenderness.  Genitourinary:    Pubic Area: No rash.      Penis: Normal.      Testes: Normal.     Epididymis:     Right: Normal.     Left: Normal.     Prostate: Enlarged (1+ smooth symm BPH). Not tender and no nodules present.     Rectum: Normal. Guaiac result negative. No mass, tenderness, anal fissure, external hemorrhoid or internal hemorrhoid. Normal anal tone.  Musculoskeletal:        General: No swelling, tenderness or deformity. Normal range of motion.     Cervical back: Neck supple.     Right lower leg: Edema (trace pitting) present.      Left lower leg: Edema (trace pitting) present.  Skin:    General: Skin is warm and dry.     Findings: No rash.  Neurological:     General: No focal deficit present.     Mental Status: He is alert and oriented to person, place, and time. Mental status is at baseline.  Psychiatric:        Attention and Perception: He is inattentive.        Mood and Affect: Mood is depressed. Mood is not anxious or elated. Affect is flat.        Speech: Speech is delayed and tangential. Speech is not rapid and pressured or slurred.        Behavior: Behavior is slowed and withdrawn.  Thought Content: Thought content normal. Thought content is not paranoid or delusional. Thought content does not include homicidal or suicidal ideation.     Lab Results  Component Value Date   WBC 4.9 12/06/2019   HGB 12.2 (L) 12/06/2019   HCT 36.0 (L) 12/06/2019   PLT 183 12/06/2019   GLUCOSE 232 (H) 12/06/2019   CHOL 129 01/06/2019   TRIG 166.0 (H) 01/06/2019   HDL 43.10 01/06/2019   LDLCALC 53 01/06/2019   ALT 10 12/06/2019   AST 17 12/06/2019   NA 133 (L) 12/06/2019   K 4.0 12/06/2019   CL 98 12/06/2019   CREATININE 0.60 (L) 12/06/2019   BUN 16 12/06/2019   CO2 28 12/06/2019   TSH 1.70 01/06/2019   HGBA1C 14.3 (A) 11/11/2019   MICROALBUR 34.4 (H) 01/06/2019    CT Soft Tissue Neck W Contrast  Result Date: 12/03/2019 CLINICAL DATA:  Sore throat difficulty swallowing. Rule out deep space infection. EXAM: CT NECK WITH CONTRAST TECHNIQUE: Multidetector CT imaging of the neck was performed using the standard protocol following the bolus administration of intravenous contrast. CONTRAST:  50mL OMNIPAQUE IOHEXOL 300 MG/ML  SOLN COMPARISON:  None. FINDINGS: Pharynx and larynx: Normal. No mass or swelling. Salivary glands: No inflammation, mass, or stone. Thyroid: Negative Lymph nodes: No enlarged lymph nodes in the neck. Vascular: Normal vascular enhancement. Atherosclerotic disease of the carotid bifurcation  bilaterally. Atherosclerotic calcification aortic arch. Limited intracranial: Negative Visualized orbits: Not included Mastoids and visualized paranasal sinuses: Paranasal sinuses clear. Mastoid clear. Skeleton: Mild cervical spondylosis. Foraminal narrowing bilaterally C5-6 and C6-7 due to spurring. Median sternotomy wires. Upper chest: Lung apices clear bilaterally. Other: None IMPRESSION: No pharyngeal edema. Negative for pharyngeal abscess. Normal airway. Negative for mass or adenopathy in the neck. Electronically Signed   By: Franchot Gallo M.D.   On: 12/03/2019 19:23    Assessment & Plan:   Javeyon was seen today for annual exam, anemia, diabetes, hyperlipidemia, hypertension, depression and atrial fibrillation.  Diagnoses and all orders for this visit:  Deficiency anemia- I will recheck his H&H and will monitor him for vitamin deficiencies. -     CBC with Differential/Platelet; Future -     Vitamin B12; Future -     Iron; Future -     Folate; Future -     Ferritin; Future -     Vitamin B1; Future -     Reticulocytes; Future -     Reticulocytes -     Vitamin B1 -     Ferritin -     Folate -     Iron -     Vitamin B12 -     CBC with Differential/Platelet  Primary hypertension- His blood pressure is adequately well controlled.  I will monitor for secondary causes and endorgan damage. -     CBC with Differential/Platelet; Future -     Basic metabolic panel; Future -     Urinalysis, Routine w reflex microscopic; Future -     TSH; Future -     TSH -     Urinalysis, Routine w reflex microscopic -     Basic metabolic panel -     CBC with Differential/Platelet  Non-proliferative diabetic retinopathy ([place])- He is due for an eye exam.  Uncontrolled type 2 diabetes mellitus with hyperglycemia ([place])- His recent A1c was 14.3%.  I recommended that he improve his compliance with basal/bolus insulin.  I have asked him to follow-up with endocrinology and diabetic education. -  Microalbumin / creatinine urine ratio; Future -     Urinalysis, Routine w reflex microscopic; Future -     Ambulatory referral to Ophthalmology -     Ambulatory referral to Endocrinology -     Amb Referral to Nutrition and Diabetic E -     Consult to [facility] [place] Care Management -     Urinalysis, Routine w reflex microscopic -     Microalbumin / creatinine urine ratio -     HM Diabetes Foot Exam  Vitamin D deficiency -     VITAMIN D 25 Hydroxy (Vit-D Deficiency, Fractures); Future -     VITAMIN D 25 Hydroxy (Vit-D Deficiency, Fractures)  Encounter for general adult medical examination with abnormal findings- Exam completed, labs reviewed, vaccines reviewed and updated, cancer screenings addressed, patient education was given.  Hyperlipidemia LDL goal <70- He has achieved his LDL goal is doing well on the statin. -     Cancel: Lipid panel; Future -     Cancel: Hepatic function panel; Future -     TSH; Future -     TSH -     atorvastatin (LIPITOR) 40 MG tablet; Take 1 tablet (40 mg total) by mouth daily.  Benign prostatic hyperplasia without lower urinary tract symptoms- He has no symptoms of obstructive uropathy.  I will screen him for prostate cancer. -     Urinalysis, Routine w reflex microscopic; Future -     PSA; Future -     PSA -     Urinalysis, Routine w reflex microscopic  Claudication of both lower extremities ([place])- I have asked him to undergo ABIs as soon as possible. -     VAS [place] ABI WITH/WO TBI; Future  Coronary artery disease involving bypass graft of transplanted heart without angina pectoris- He is due for a follow-up with cardiology. -     Ambulatory referral to Cardiology  Paroxysmal atrial fibrillation (HCC)-he is maintaining sinus rhythm.  Will continue anticoagulation with the DOAC. -     apixaban (ELIQUIS) 5 MG TABS tablet; Take 1 tablet (5 mg total) by mouth 2 (two) times daily.  Recurrent major depressive disorder, in partial remission ([place])- He tells me he is  doing well on the current regimen.  At his request I will continue the current doses. -     PARoxetine (PAXIL) 20 MG tablet; Take 1 tablet (20 mg total) by mouth daily. -     traZODone (DESYREL) 50 MG tablet; Take 1 tablet (50 mg total) by mouth at bedtime. -     mirtazapine (REMERON) 15 MG tablet; Take 1 tablet (15 mg total) by mouth at bedtime.  Other orders -     Pneumococcal polysaccharide vaccine 23-valent greater than or equal to 2yo subcutaneous/IM   I have discontinued Cobie L. Ritter's aspirin, gabapentin, and traMADol. I have also changed his atorvastatin and PARoxetine. Additionally, I am having him start on traZODone and mirtazapine. Lastly, I am having him maintain his Vitamin D, Droplet Pen Needles, insulin glargine, FreeStyle Libre 14 Day Reader, FreeStyle Libre 14 Day Sensor, B Complex Vitamins (B COMPLEX-B12 PO), lisinopril, Insulin Aspart FlexPen, and apixaban.  Meds ordered this encounter  Medications  . atorvastatin (LIPITOR) 40 MG tablet    Sig: Take 1 tablet (40 mg total) by mouth daily.    Dispense:  90 tablet    Refill:  1  . PARoxetine (PAXIL) 20 MG tablet    Sig: Take 1 tablet (20 mg total) by mouth daily.  Dispense:  90 tablet    Refill:  1  . traZODone (DESYREL) 50 MG tablet    Sig: Take 1 tablet (50 mg total) by mouth at bedtime.    Dispense:  90 tablet    Refill:  1  . apixaban (ELIQUIS) 5 MG TABS tablet    Sig: Take 1 tablet (5 mg total) by mouth 2 (two) times daily.    Dispense:  180 tablet    Refill:  0  . mirtazapine (REMERON) 15 MG tablet    Sig: Take 1 tablet (15 mg total) by mouth at bedtime.    Dispense:  90 tablet    Refill:  1   In addition to time spent on CPE, I spent 50 minutes in preparing to see the patient by review of recent labs, imaging and procedures, obtaining and reviewing separately obtained history, communicating with the patient and family or caregiver, ordering medications, tests or procedures, and documenting clinical  information in the EHR including the differential Dx, treatment, and any further evaluation and other management of 1. Deficiency anemia 2. Primary hypertension 3. Non-proliferative diabetic retinopathy ([place]) 4. Uncontrolled type 2 diabetes mellitus with hyperglycemia ([place]) 5. Vitamin D deficiency 6. Hyperlipidemia LDL goal <70 7. Benign prostatic hyperplasia without lower urinary tract symptoms 8. Claudication of both lower extremities ([place]) 9. Coronary artery disease involving bypass graft of transplanted heart without angina pectoris 10. Paroxysmal atrial fibrillation (HCC) 11. Recurrent major depressive disorder, in partial remission ([place])     Follow-up: Return in about 3 months (around 04/12/2020).  [name], MD

## 2020-01-14 ENCOUNTER — Telehealth: Payer: Self-pay | Admitting: Internal Medicine

## 2020-01-14 NOTE — Telephone Encounter (Signed)
Estill Bamberg at [facility] calling, states they got a new order for case management for the patient and he is already enrolled. They have been trying to contact him but he is hard to reach and when they do talk to him he does not give them much information. They are going to continue to try to reach out, and were just wondering if there was anything else specific they needed to be doing.  [phone]

## 2020-01-14 NOTE — Telephone Encounter (Signed)
Noted  

## 2020-01-14 NOTE — Telephone Encounter (Signed)
Patient was at the office yesterday and was sent to the lab. He left without getting his labs drawn because his daughter was in a car wreck and he left. Can you please reorder the labs. He is coming in on 01/15/20 to have them drawn.

## 2020-01-15 ENCOUNTER — Other Ambulatory Visit: Payer: HMO

## 2020-01-19 NOTE — Telephone Encounter (Signed)
Patient said he would go over to the [place] location once the weather clears up. He was also wondering if something could be placed to check his liver. He said his old PCP told him he was in liver failure.

## 2020-01-19 NOTE — Telephone Encounter (Signed)
Ask him to come back to get the labs done

## 2020-01-20 ENCOUNTER — Encounter: Payer: Self-pay | Admitting: Internal Medicine

## 2020-01-27 ENCOUNTER — Other Ambulatory Visit: Payer: Self-pay | Admitting: Internal Medicine

## 2020-01-27 ENCOUNTER — Telehealth: Payer: Self-pay | Admitting: Internal Medicine

## 2020-01-27 DIAGNOSIS — L089 Local infection of the skin and subcutaneous tissue, unspecified: Secondary | ICD-10-CM | POA: Insufficient documentation

## 2020-01-27 DIAGNOSIS — E10628 Type 1 diabetes mellitus with other skin complications: Secondary | ICD-10-CM | POA: Insufficient documentation

## 2020-01-27 DIAGNOSIS — E1042 Type 1 diabetes mellitus with diabetic polyneuropathy: Secondary | ICD-10-CM

## 2020-01-27 MED ORDER — FREESTYLE LIBRE 14 DAY SENSOR MISC: 1.0000 | Freq: Every day | 5 refills | Status: DC

## 2020-01-27 MED ORDER — INSULIN ASPART FLEXPEN 100 UNIT/ML ~~LOC~~ SOPN: 10.0000 [IU] | PEN_INJECTOR | SUBCUTANEOUS | 1 refills | Status: DC

## 2020-01-27 MED ORDER — DROPLET PEN NEEDLES 32G X 4 MM MISC: 5 refills | Status: DC

## 2020-01-27 MED ORDER — FREESTYLE LIBRE 14 DAY READER DEVI: 1.0000 | Freq: Every day | 5 refills | Status: DC

## 2020-01-27 MED ORDER — INSULIN GLARGINE 100 UNIT/ML ~~LOC~~ SOLN: 50.0000 [IU] | Freq: Every day | SUBCUTANEOUS | 3 refills | Status: DC

## 2020-01-27 NOTE — Telephone Encounter (Signed)
    Patient requesting refill for Continuous Blood Gluc Sensor (FREESTYLE LIBRE 14 DAY SENSOR) MISC  Insulin Aspart FlexPen 100 UNIT/ML SOPN  insulin glargine (LANTUS) 100 UNIT/ML injection  Insulin Pen Needle (DROPLET PEN NEEDLES) 32G X 4 MM MISC

## 2020-01-28 ENCOUNTER — Encounter: Payer: Self-pay | Admitting: Internal Medicine

## 2020-01-29 ENCOUNTER — Ambulatory Visit: Payer: HMO | Admitting: Registered"

## 2020-02-08 ENCOUNTER — Encounter: Payer: Self-pay | Admitting: Internal Medicine

## 2020-02-15 ENCOUNTER — Encounter: Payer: Self-pay | Admitting: Pharmacist

## 2020-02-15 NOTE — Chronic Care Management (AMB) (Signed)
ERROR

## 2020-02-16 ENCOUNTER — Telehealth: Payer: HMO

## 2020-02-24 ENCOUNTER — Telehealth: Payer: Self-pay | Admitting: Internal Medicine

## 2020-02-24 NOTE — Telephone Encounter (Signed)
Patient called and said that he did an EKG this morning and it said that he was in A fib and then he did a few more throughout the day and said that they were normal. Please advise

## 2020-02-25 ENCOUNTER — Encounter (HOSPITAL_COMMUNITY): Payer: HMO

## 2020-02-26 NOTE — Telephone Encounter (Signed)
Pt has been informed and made a lab appt to have labs drawn.

## 2020-02-26 NOTE — Telephone Encounter (Signed)
This is not new for him Ask him to come in and do the labs that were ordered last Dec

## 2020-02-29 ENCOUNTER — Other Ambulatory Visit: Payer: HMO

## 2020-03-10 ENCOUNTER — Other Ambulatory Visit: Payer: Self-pay

## 2020-03-14 ENCOUNTER — Ambulatory Visit: Payer: HMO | Admitting: Endocrinology

## 2020-03-16 ENCOUNTER — Ambulatory Visit (INDEPENDENT_AMBULATORY_CARE_PROVIDER_SITE_OTHER): Payer: HMO | Admitting: Internal Medicine

## 2020-03-16 ENCOUNTER — Encounter: Payer: Self-pay | Admitting: Internal Medicine

## 2020-03-16 ENCOUNTER — Other Ambulatory Visit: Payer: Self-pay

## 2020-03-16 VITALS — BP 108/70 | HR 80 | Temp 98.5°F | Ht 74.0 in | Wt 155.0 lb

## 2020-03-16 DIAGNOSIS — D539 Nutritional anemia, unspecified: Secondary | ICD-10-CM

## 2020-03-16 DIAGNOSIS — B079 Viral wart, unspecified: Secondary | ICD-10-CM | POA: Diagnosis not present

## 2020-03-16 DIAGNOSIS — I480 Paroxysmal atrial fibrillation: Secondary | ICD-10-CM | POA: Insufficient documentation

## 2020-03-16 DIAGNOSIS — E785 Hyperlipidemia, unspecified: Secondary | ICD-10-CM | POA: Diagnosis not present

## 2020-03-16 DIAGNOSIS — E1165 Type 2 diabetes mellitus with hyperglycemia: Secondary | ICD-10-CM | POA: Diagnosis not present

## 2020-03-16 DIAGNOSIS — E1042 Type 1 diabetes mellitus with diabetic polyneuropathy: Secondary | ICD-10-CM

## 2020-03-16 DIAGNOSIS — N401 Enlarged prostate with lower urinary tract symptoms: Secondary | ICD-10-CM

## 2020-03-16 DIAGNOSIS — N138 Other obstructive and reflux uropathy: Secondary | ICD-10-CM

## 2020-03-16 DIAGNOSIS — R809 Proteinuria, unspecified: Secondary | ICD-10-CM | POA: Diagnosis not present

## 2020-03-16 LAB — HEPATIC FUNCTION PANEL
ALT: 26 U/L (ref 0–53)
AST: 22 U/L (ref 0–37)
Albumin: 3.9 g/dL (ref 3.5–5.2)
Alkaline Phosphatase: 57 U/L (ref 39–117)
Bilirubin, Direct: 0.1 mg/dL (ref 0.0–0.3)
Total Bilirubin: 0.6 mg/dL (ref 0.2–1.2)
Total Protein: 6.4 g/dL (ref 6.0–8.3)

## 2020-03-16 LAB — CBC WITH DIFFERENTIAL/PLATELET
Basophils Absolute: 0 10*3/uL (ref 0.0–0.1)
Basophils Relative: 0.8 % (ref 0.0–3.0)
Eosinophils Absolute: 0.1 10*3/uL (ref 0.0–0.7)
Eosinophils Relative: 1.6 % (ref 0.0–5.0)
HCT: 39.9 % (ref 39.0–52.0)
Hemoglobin: 13.6 g/dL (ref 13.0–17.0)
Lymphocytes Relative: 27.8 % (ref 12.0–46.0)
Lymphs Abs: 1.3 10*3/uL (ref 0.7–4.0)
MCHC: 34.2 g/dL (ref 30.0–36.0)
MCV: 94.7 fl (ref 78.0–100.0)
Monocytes Absolute: 0.5 10*3/uL (ref 0.1–1.0)
Monocytes Relative: 10.7 % (ref 3.0–12.0)
Neutro Abs: 2.8 10*3/uL (ref 1.4–7.7)
Neutrophils Relative %: 59.1 % (ref 43.0–77.0)
Platelets: 167 10*3/uL (ref 150.0–400.0)
RBC: 4.21 Mil/uL — ABNORMAL LOW (ref 4.22–5.81)
RDW: 13.5 % (ref 11.5–15.5)
WBC: 4.7 10*3/uL (ref 4.0–10.5)

## 2020-03-16 LAB — BASIC METABOLIC PANEL
BUN: 17 mg/dL (ref 6–23)
CO2: 30 mEq/L (ref 19–32)
Calcium: 9.1 mg/dL (ref 8.4–10.5)
Chloride: 96 mEq/L (ref 96–112)
Creatinine, Ser: 0.71 mg/dL (ref 0.40–1.50)
GFR: 95.2 mL/min (ref >60.00)
Glucose, Bld: 391 mg/dL — ABNORMAL HIGH (ref 70–99)
Potassium: 4.2 mEq/L (ref 3.5–5.1)
Sodium: 132 mEq/L — ABNORMAL LOW (ref 135–145)

## 2020-03-16 LAB — MICROALBUMIN / CREATININE URINE RATIO
Creatinine,U: 38.9 mg/dL
Microalb Creat Ratio: 104.8 mg/g — ABNORMAL HIGH (ref 0.0–30.0)
Microalb, Ur: 40.8 mg/dL — ABNORMAL HIGH (ref 0.0–1.9)

## 2020-03-16 LAB — LIPID PANEL
Cholesterol: 123 mg/dL (ref 0–200)
HDL: 38.6 mg/dL — ABNORMAL LOW (ref >39.00)
NonHDL: 84.37
Total CHOL/HDL Ratio: 3
Triglycerides: 222 mg/dL — ABNORMAL HIGH (ref 0.0–149.0)
VLDL: 44.4 mg/dL — ABNORMAL HIGH (ref 0.0–40.0)

## 2020-03-16 LAB — FERRITIN: Ferritin: 498.4 ng/mL — ABNORMAL HIGH (ref 22.0–322.0)

## 2020-03-16 LAB — URINALYSIS, ROUTINE W REFLEX MICROSCOPIC
Bilirubin Urine: NEGATIVE
Hgb urine dipstick: NEGATIVE
Ketones, ur: 15 — AB
Leukocytes,Ua: NEGATIVE
Nitrite: NEGATIVE
Specific Gravity, Urine: 1.01 (ref 1.000–1.030)
Total Protein, Urine: 30 — AB
Urine Glucose: >=1000 — AB
Urobilinogen, UA: 0.2 (ref 0.0–1.0)
pH: 5.5 (ref 5.0–8.0)

## 2020-03-16 LAB — VITAMIN B12: Vitamin B-12: 576 pg/mL (ref 211–911)

## 2020-03-16 LAB — FOLATE: Folate: >23.6 ng/mL (ref >5.9)

## 2020-03-16 LAB — IRON: Iron: 83 ug/dL (ref 42–165)

## 2020-03-16 LAB — HEMOGLOBIN A1C: Hgb A1c MFr Bld: 14.3 % — ABNORMAL HIGH (ref 4.6–6.5)

## 2020-03-16 LAB — LDL CHOLESTEROL, DIRECT: Direct LDL: 37 mg/dL

## 2020-03-16 LAB — TSH: TSH: 2.8 u[IU]/mL (ref 0.35–4.50)

## 2020-03-16 MED ORDER — APIXABAN 5 MG PO TABS: 5.0000 mg | ORAL_TABLET | Freq: Two times a day (BID) | ORAL | 0 refills | Status: DC

## 2020-03-16 NOTE — Progress Notes (Signed)
Subjective:  Patient ID: [name] [name], male    DOB: [birth year]  Age: 65 y.o. MRN: [id number]  CC: Anemia and Diabetes  This visit occurred during the SARS-CoV-2 public health emergency.  Safety protocols were in place, including screening questions prior to the visit, additional usage of staff PPE, and extensive cleaning of exam room while observing appropriate contact time as indicated for disinfecting solutions.    HPI [name] presents for f/up - He tells me that his heart monitor watch is telling him that he is in and out of Afib. He has rare episodes of dizziness and orthostatic lighteheadedness. He denies palpitations or near syncope. He has a wart on his RIF that wants to have treated.  Outpatient Medications Prior to Visit  Medication Sig Dispense Refill  . atorvastatin (LIPITOR) 40 MG tablet Take 1 tablet (40 mg total) by mouth daily. 90 tablet 1  . B Complex Vitamins (B COMPLEX-B12 PO) Take 1 tablet by mouth daily.     . Cholecalciferol (VITAMIN D) 125 MCG (5000 UT) CAPS Take 5,000 Units by mouth daily.     . Continuous Blood Gluc Receiver (FREESTYLE LIBRE 14 DAY READER) DEVI 1 each by Does not apply route daily. 2 each 5  . Continuous Blood Gluc Sensor (FREESTYLE LIBRE 14 DAY SENSOR) MISC 1 each by Does not apply route daily. 2 each 5  . Insulin Aspart FlexPen 100 UNIT/ML SOPN Inject 10-20 Units into the skin See admin instructions. Injects10-20 units into the skin three times daily per sliding scale. 9 mL 1  . insulin glargine (LANTUS) 100 UNIT/ML injection Inject 0.5 mLs (50 Units total) into the skin at bedtime. 10 mL 3  . Insulin Pen Needle (DROPLET PEN NEEDLES) 32G X 4 MM MISC Use daily for glucose control 100 each 5  . lisinopril (ZESTRIL) 5 MG tablet TAKE 1 TABLET BY MOUTH DAILY (Patient taking differently: Take 5 mg by mouth daily.) 90 tablet 1  . mirtazapine (REMERON) 15 MG tablet Take 1 tablet (15 mg total) by mouth at bedtime. 90 tablet 1  .  PARoxetine (PAXIL) 20 MG tablet Take 1 tablet (20 mg total) by mouth daily. 90 tablet 1  . traZODone (DESYREL) 50 MG tablet Take 1 tablet (50 mg total) by mouth at bedtime. 90 tablet 1  . apixaban (ELIQUIS) 5 MG TABS tablet Take 1 tablet (5 mg total) by mouth 2 (two) times daily. 180 tablet 0   No facility-administered medications prior to visit.    ROS Review of Systems  Constitutional: Positive for unexpected weight change (wt loss). Negative for appetite change, chills, diaphoresis and fatigue.  HENT: Negative.  Negative for sore throat and trouble swallowing.   Eyes: Negative.  Negative for visual disturbance.  Respiratory: Negative for cough, chest tightness, shortness of breath and wheezing.   Cardiovascular: Negative for chest pain, palpitations and leg swelling.  Gastrointestinal: Positive for constipation. Negative for abdominal pain, blood in stool, diarrhea, nausea and vomiting.  Endocrine: Positive for polydipsia, polyphagia and polyuria.  Genitourinary: Negative.  Negative for difficulty urinating, hematuria, scrotal swelling, testicular pain and urgency.  Musculoskeletal: Positive for gait problem. Negative for arthralgias and myalgias.  Skin: Negative.  Negative for color change and pallor.  Neurological: Positive for dizziness and light-headedness. Negative for weakness and numbness.  Hematological: Negative for adenopathy. Does not bruise/bleed easily.  Psychiatric/Behavioral: Positive for confusion. Negative for sleep disturbance. The patient is not nervous/anxious.     Objective:  BP 108/70  Pulse 80   Temp 98.5 F (36.9 C) (Oral)   Ht 6\' 2"  (1.88 m)   Wt 155 lb (70.3 kg)   SpO2 93%   BMI 19.90 kg/m   BP Readings from Last 3 Encounters:  03/16/20 108/70  01/13/20 118/76  12/09/19 111/71    Wt Readings from Last 3 Encounters:  03/16/20 155 lb (70.3 kg)  01/13/20 162 lb (73.5 kg)  12/03/19 140 lb (63.5 kg)    Physical Exam Constitutional:       Appearance: He is cachectic. He is ill-appearing (disheveled).  HENT:     Nose: Nose normal.     Mouth/Throat:     Mouth: Mucous membranes are moist.  Eyes:     General: No scleral icterus.    Conjunctiva/sclera: Conjunctivae normal.  Cardiovascular:     Rate and Rhythm: Normal rate and regular rhythm.     Heart sounds: No murmur heard.   Pulmonary:     Effort: Pulmonary effort is normal.     Breath sounds: No stridor. No wheezing, rhonchi or rales.  Abdominal:     General: Abdomen is flat.     Palpations: There is no mass.     Tenderness: There is no abdominal tenderness. There is no guarding.     Hernia: There is no hernia in the left inguinal area or right inguinal area.  Genitourinary:    Pubic Area: No rash.      Penis: Normal and circumcised.      Testes: Normal.     Epididymis:     Right: Normal. Not inflamed or enlarged.     Left: Not inflamed or enlarged.     Prostate: Enlarged. Not tender and no nodules present.     Rectum: Normal. Guaiac result negative. No mass, tenderness, anal fissure, external hemorrhoid or internal hemorrhoid. Normal anal tone.  Musculoskeletal:        General: Normal range of motion.     Cervical back: Neck supple.     Right lower leg: No edema.     Left lower leg: No edema.     Comments: 3 mm verrucous lesion on the dorsum of RIF over DIP joint  Lymphadenopathy:     Cervical: No cervical adenopathy.     Lower Body: No right inguinal adenopathy. No left inguinal adenopathy.  Skin:    General: Skin is warm and dry.     Coloration: Skin is not pale.  Neurological:     General: No focal deficit present.     Mental Status: He is alert.  Psychiatric:        Mood and Affect: Mood normal.        Behavior: Behavior normal. Behavior is cooperative.     Lab Results  Component Value Date   WBC 4.7 03/16/2020   HGB 13.6 03/16/2020   HCT 39.9 03/16/2020   PLT 167.0 03/16/2020   GLUCOSE 391 (H) 03/16/2020   CHOL 123 03/16/2020   TRIG  222.0 (H) 03/16/2020   HDL 38.60 (L) 03/16/2020   LDLDIRECT 37.0 03/16/2020   LDLCALC 53 01/06/2019   ALT 26 03/16/2020   AST 22 03/16/2020   NA 132 (L) 03/16/2020   K 4.2 03/16/2020   CL 96 03/16/2020   CREATININE 0.71 03/16/2020   BUN 17 03/16/2020   CO2 30 03/16/2020   TSH 2.80 03/16/2020   HGBA1C 14.3 (H) 03/16/2020   MICROALBUR 40.8 (H) 03/16/2020    CT Soft Tissue Neck W Contrast  Result Date:  12/03/2019 CLINICAL DATA:  Sore throat difficulty swallowing. Rule out deep space infection. EXAM: CT NECK WITH CONTRAST TECHNIQUE: Multidetector CT imaging of the neck was performed using the standard protocol following the bolus administration of intravenous contrast. CONTRAST:  58mL OMNIPAQUE IOHEXOL 300 MG/ML  SOLN COMPARISON:  None. FINDINGS: Pharynx and larynx: Normal. No mass or swelling. Salivary glands: No inflammation, mass, or stone. Thyroid: Negative Lymph nodes: No enlarged lymph nodes in the neck. Vascular: Normal vascular enhancement. Atherosclerotic disease of the carotid bifurcation bilaterally. Atherosclerotic calcification aortic arch. Limited intracranial: Negative Visualized orbits: Not included Mastoids and visualized paranasal sinuses: Paranasal sinuses clear. Mastoid clear. Skeleton: Mild cervical spondylosis. Foraminal narrowing bilaterally C5-6 and C6-7 due to spurring. Median sternotomy wires. Upper chest: Lung apices clear bilaterally. Other: None IMPRESSION: No pharyngeal edema. Negative for pharyngeal abscess. Normal airway. Negative for mass or adenopathy in the neck. Electronically Signed   By: Franchot Gallo M.D.   On: 12/03/2019 19:23   Verbal informed consent obtained. Liquid nitrogen was applied for 10-12 seconds to the skin lesion and the expected blistering or scabbing reaction explained. Patient reminded to expect hypopigmented scars from the procedure. He tolerated this well. Return if lesions fail to fully resolve.  Assessment & Plan:   Buryl was seen  today for anemia and diabetes.  Diagnoses and all orders for this visit:  Type 1 diabetes mellitus with diabetic polyneuropathy ([place]) -     Basic metabolic panel; Future -     Urinalysis, Routine w reflex microscopic; Future -     Hemoglobin A1c; Future -     Microalbumin / creatinine urine ratio; Future -     Microalbumin / creatinine urine ratio -     Hemoglobin A1c -     Urinalysis, Routine w reflex microscopic -     Basic metabolic panel -     Ambulatory referral to Ophthalmology -     Ambulatory referral to Endocrinology  Uncontrolled type 2 diabetes mellitus with hyperglycemia ([place])- His blood sugar is not well controlled. He agreed to be more compliant with the insulin regimen. -     Basic metabolic panel; Future -     Hemoglobin A1c; Future -     Microalbumin / creatinine urine ratio; Future -     Microalbumin / creatinine urine ratio -     Hemoglobin A1c -     Basic metabolic panel -     Ambulatory referral to Ophthalmology -     Ambulatory referral to Endocrinology  Deficiency anemia- His H/H are normal now. -     CBC with Differential/Platelet; Future -     Iron; Future -     Vitamin B12; Future -     Folate; Future -     Ferritin; Future -     Reticulocytes; Future -     Vitamin B1; Future -     Zinc; Future -     Zinc -     Vitamin B1 -     Reticulocytes -     Ferritin -     Folate -     Vitamin B12 -     Iron -     CBC with Differential/Platelet  Microalbuminuria- Will cont the ACEI. -     Urinalysis, Routine w reflex microscopic; Future -     Microalbumin / creatinine urine ratio; Future -     Microalbumin / creatinine urine ratio -     Urinalysis, Routine w reflex microscopic  Hyperlipidemia LDL goal <70- LDL goal achieved. Doing well on the statin -     Lipid panel; Future -     TSH; Future -     Hepatic function panel; Future -     Hepatic function panel -     TSH -     Lipid panel  BPH with obstruction/lower urinary tract symptoms- I will  check a PSA to screen for ca. -     Cancel: PSA; Future -     PSA; Future -     PSA  PAF (paroxysmal atrial fibrillation) ([place]) -     Ambulatory referral to Cardiology -     apixaban (ELIQUIS) 5 MG TABS tablet; Take 1 tablet (5 mg total) by mouth 2 (two) times daily.  Viral wart on finger  Paroxysmal atrial fibrillation ([facility])  Other orders -     LDL cholesterol, direct   I am having Holton L. Rayo maintain his Vitamin D, B Complex Vitamins (B COMPLEX-B12 PO), lisinopril, atorvastatin, PARoxetine, traZODone, mirtazapine, FreeStyle Libre 14 Day Reader, FreeStyle Libre 14 Day Sensor, Insulin Aspart FlexPen, Droplet Pen Needles, insulin glargine, and apixaban.  Meds ordered this encounter  Medications  . apixaban (ELIQUIS) 5 MG TABS tablet    Sig: Take 1 tablet (5 mg total) by mouth 2 (two) times daily.    Dispense:  180 tablet    Refill:  0     Follow-up: Return in about 4 weeks (around 04/13/2020).  [name], MD

## 2020-03-16 NOTE — Patient Instructions (Signed)
[employer] medicine (25th ed., pp. [phone]). [place], [place]: Elsevier.">  Anemia  Anemia is a condition in which there is not enough red blood cells or hemoglobin in the blood. Hemoglobin is a substance in red blood cells that carries oxygen. When you do not have enough red blood cells or hemoglobin (are anemic), your body cannot get enough oxygen and your organs may not work properly. As a result, you may feel very tired or have other problems. What are the causes? Common causes of anemia include:  Excessive bleeding. Anemia can be caused by excessive bleeding inside or outside the body, including bleeding from the intestines or from heavy menstrual periods in females.  Poor nutrition.  Long-lasting (chronic) kidney, thyroid, and liver disease.  Bone marrow disorders, spleen problems, and blood disorders.  Cancer and treatments for cancer.  HIV (human immunodeficiency virus) and AIDS (acquired immunodeficiency syndrome).  Infections, medicines, and autoimmune disorders that destroy red blood cells. What are the signs or symptoms? Symptoms of this condition include:  Minor weakness.  Dizziness.  Headache, or difficulties concentrating and sleeping.  Heartbeats that feel irregular or faster than normal (palpitations).  Shortness of breath, especially with exercise.  Pale skin, lips, and nails, or cold hands and feet.  Indigestion and nausea. Symptoms may occur suddenly or develop slowly. If your anemia is mild, you may not have symptoms. How is this diagnosed? This condition is diagnosed based on blood tests, your medical history, and a physical exam. In some cases, a test may be needed in which cells are removed from the soft tissue inside of a bone and looked at under a microscope (bone marrow biopsy). Your health care provider may also check your stool (feces) for blood and may do additional testing to look for the cause of your bleeding. Other tests may  include:  Imaging tests, such as a CT scan or MRI.  A procedure to see inside your esophagus and stomach (endoscopy).  A procedure to see inside your colon and rectum (colonoscopy). How is this treated? Treatment for this condition depends on the cause. If you continue to lose a lot of blood, you may need to be treated at a hospital. Treatment may include:  Taking supplements of iron, vitamin Q68, or folic acid.  Taking a hormone medicine (erythropoietin) that can help to stimulate red blood cell growth.  Having a blood transfusion. This may be needed if you lose a lot of blood.  Making changes to your diet.  Having surgery to remove your spleen. Follow these instructions at home:  Take over-the-counter and prescription medicines only as told by your health care provider.  Take supplements only as told by your health care provider.  Follow any diet instructions that you were given by your health care provider.  Keep all follow-up visits as told by your health care provider. This is important. Contact a health care provider if:  You develop new bleeding anywhere in the body. Get help right away if:  You are very weak.  You are short of breath.  You have pain in your abdomen or chest.  You are dizzy or feel faint.  You have trouble concentrating.  You have bloody stools, black stools, or tarry stools.  You vomit repeatedly or you vomit up blood. These symptoms may represent a serious problem that is an emergency. Do not wait to see if the symptoms will go away. Get medical help right away. Call your local emergency services (911 in the [place] Do not  drive yourself to the hospital. Summary  Anemia is a condition in which you do not have enough red blood cells or enough of a substance in your red blood cells that carries oxygen (hemoglobin).  Symptoms may occur suddenly or develop slowly.  If your anemia is mild, you may not have symptoms.  This condition is  diagnosed with blood tests, a medical history, and a physical exam. Other tests may be needed.  Treatment for this condition depends on the cause of the anemia. This information is not intended to replace advice given to you by your health care provider. Make sure you discuss any questions you have with your health care provider. Document Revised: 11/25/2018 Document Reviewed: 11/25/2018 Elsevier Patient Education  2021 Elsevier Inc.  

## 2020-03-18 ENCOUNTER — Encounter: Payer: Self-pay | Admitting: Internal Medicine

## 2020-03-18 LAB — PSA: PSA: 0.24 ng/mL (ref 0.10–4.00)

## 2020-03-20 NOTE — Progress Notes (Deleted)
Cardiology Office Note:   Date:  03/20/2020  NAME:  [name] [name]    MRN: [id number] DOB:  1955-05-31   PCP:  Janith Lima, MD  Cardiologist:  No primary care provider on file.  Electrophysiologist:  None   Referring MD: Janith Lima, MD   No chief complaint on file. ***  History of Present Illness:   [name] [name] is a 65 y.o. male with a hx of CAD s/p CABG, HTN, HLD, DM who is being seen today for the evaluation of tachycardia at the request of Janith Lima, MD.  Problem List 1. CAD  -CABG x 3 (09/30/2001 [facility]) -LIMA-LAD, RIMA-DCA, L radial-LCX, SVG-D1 2. HLD -T chol 123, HDL 39, LDL 37, TG 222 3. Type 1 DM 4. HTN  Past Medical History: Past Medical History:  Diagnosis Date  . Anxiety   . Aortic dissection ([place])   . Arthritis   . Constipation    pt states goes EOD- hard stools   . Coronary artery disease   . Depression   . Diabetes mellitus without complication ([place])   . DKA (diabetic ketoacidoses)   . Hyperlipidemia   . Hypertension    off meds  . Psoriasis     Past Surgical History: Past Surgical History:  Procedure Laterality Date  . ANKLE SURGERY     right  . CARDIAC CATHETERIZATION  09/25/2001  . COLONOSCOPY     ~10 yr ago- normal  per pt   . CORONARY ARTERY BYPASS GRAFT  09/30/2001   CABG x 4  . LAPAROSCOPIC INGUINAL HERNIA REPAIR Bilateral 02/09/2010  . MASS EXCISION Left 03/12/2013   Procedure: LEFT INDEX EXCISION MASS AND DEBRIDMENT DISTAL INTERPHALANGEAL JOINT;  Surgeon: Tennis Must, MD;  Location: [facility];  Service: Orthopedics;  Laterality: Left;    Current Medications: No outpatient medications have been marked as taking for the 03/21/20 encounter (Appointment) with O'Neal, Cassie Freer, MD.     Allergies:    Patient has no known allergies.   Social History: Social History   Socioeconomic History  . Marital status: Divorced    Spouse name: Not on file  . Number of children: 4  . Years of  education: 50  . Highest education level: 12th grade  Occupational History  . Occupation: Retired  Tobacco Use  . Smoking status: Current Some Day Smoker    Types: Cigars  . Smokeless tobacco: Never Used  Vaping Use  . Vaping Use: Never used  Substance and Sexual Activity  . Alcohol use: Yes    Comment: daily beer or scotch   . Drug use: No  . Sexual activity: Yes  Other Topics Concern  . Not on file  Social History Narrative   Lives with his adult son [name].   Has 4 children, 2 sons- both live in [place], 2 daughters one lives in [place], [place] and youngest daughter attends [employer].    Fiance lives in  [place], [place]   Social Determinants of Health   Financial Resource Strain: Unknown  . Difficulty of Paying Living Expenses: Patient refused  Food Insecurity: No Food Insecurity  . Worried About Charity fundraiser in the Last Year: Never true  . Ran Out of Food in the Last Year: Never true  Transportation Needs: No Transportation Needs  . Lack of Transportation (Medical): No  . Lack of Transportation (Non-Medical): No  Physical Activity: Inactive  . Days of Exercise per Week: 0 days  .  Minutes of Exercise per Session: 0 min  Stress: Stress Concern Present  . Feeling of Stress : Very much  Social Connections: Socially Isolated  . Frequency of Communication with Friends and Family: More than three times a week  . Frequency of Social Gatherings with Friends and Family: More than three times a week  . Attends Religious Services: Never  . Active Member of Clubs or Organizations: No  . Attends Archivist Meetings: Never  . Marital Status: Divorced     Family History: The patient's ***family history includes Cancer in his paternal grandmother; Colon cancer in his paternal grandmother; Lymphoma in his father. There is no history of Colon polyps, Esophageal cancer, Rectal cancer, or Stomach cancer.  ROS:   All other ROS reviewed and negative.  Pertinent positives noted in the HPI.     EKGs/Labs/Other Studies Reviewed:   The following studies were personally reviewed by me today:  EKG:  EKG is *** ordered today.  The ekg ordered today demonstrates ***, and was personally reviewed by me.   Recent Labs: 03/16/2020: ALT 26; BUN 17; Creatinine, Ser 0.71; Hemoglobin 13.6; Platelets 167.0; Potassium 4.2; Sodium 132; TSH 2.80   Recent Lipid Panel    Component Value Date/Time   CHOL 123 03/16/2020 0958   TRIG 222.0 (H) 03/16/2020 0958   HDL 38.60 (L) 03/16/2020 0958   CHOLHDL 3 03/16/2020 0958   VLDL 44.4 (H) 03/16/2020 0958   LDLCALC 53 01/06/2019 1446   LDLDIRECT 37.0 03/16/2020 0958    Physical Exam:   VS:  There were no vitals taken for this visit.   Wt Readings from Last 3 Encounters:  03/16/20 155 lb (70.3 kg)  01/13/20 162 lb (73.5 kg)  12/03/19 140 lb (63.5 kg)    General: Well nourished, well developed, in no acute distress Head: Atraumatic, normal size  Eyes: PEERLA, EOMI  Neck: Supple, no JVD Endocrine: No thryomegaly Cardiac: Normal S1, S2; RRR; no murmurs, rubs, or gallops Lungs: Clear to auscultation bilaterally, no wheezing, rhonchi or rales  Abd: Soft, nontender, no hepatomegaly  Ext: No edema, pulses 2+ Musculoskeletal: No deformities, BUE and BLE strength normal and equal Skin: Warm and dry, no rashes   Neuro: Alert and oriented to person, place, time, and situation, CNII-XII grossly intact, no focal deficits  Psych: Normal mood and affect   ASSESSMENT:   [name] [name] is a 65 y.o. male who presents for the following: No diagnosis found.  PLAN:   There are no diagnoses linked to this encounter.  Disposition: No follow-ups on file.  Medication Adjustments/Labs and Tests Ordered: Current medicines are reviewed at length with the patient today.  Concerns regarding medicines are outlined above.  No orders of the defined types were placed in this encounter.  No orders of the defined types  were placed in this encounter.   There are no Patient Instructions on file for this visit.   Time Spent with Patient: I have spent a total of *** minutes with patient reviewing hospital notes, telemetry, EKGs, labs and examining the patient as well as establishing an assessment and plan that was discussed with the patient.  > 50% of time was spent in direct patient care.  Signed, Addison Naegeli. Audie Box, MD, [facility]  [address], [place], [place] [address] [phone]  03/20/2020 3:17 PM

## 2020-03-21 ENCOUNTER — Ambulatory Visit: Payer: HMO | Admitting: Cardiovascular Disease

## 2020-03-21 DIAGNOSIS — R000 Tachycardia, unspecified: Secondary | ICD-10-CM

## 2020-03-21 DIAGNOSIS — I10 Essential (primary) hypertension: Secondary | ICD-10-CM

## 2020-03-21 DIAGNOSIS — I25810 Atherosclerosis of coronary artery bypass graft(s) without angina pectoris: Secondary | ICD-10-CM

## 2020-03-21 DIAGNOSIS — R002 Palpitations: Secondary | ICD-10-CM

## 2020-03-21 DIAGNOSIS — E782 Mixed hyperlipidemia: Secondary | ICD-10-CM

## 2020-03-23 LAB — RETICULOCYTES
ABS Retic: 71400 cells/uL (ref 25000–9000)
Retic Ct Pct: 1.7 %

## 2020-03-23 LAB — ZINC: Zinc: 91 ug/dL (ref 60–130)

## 2020-03-23 LAB — VITAMIN B1: Vitamin B1 (Thiamine): 50 nmol/L — ABNORMAL HIGH (ref 8–30)

## 2020-03-31 ENCOUNTER — Telehealth: Payer: Self-pay | Admitting: Internal Medicine

## 2020-03-31 NOTE — Telephone Encounter (Signed)
He should see any eye doc asap

## 2020-03-31 NOTE — Telephone Encounter (Signed)
Patient requesting referral to Ophthalmology for weeping eye and eye pain for several weeks

## 2020-03-31 NOTE — Telephone Encounter (Signed)
That referral is for a diabetic eye exam. If patient is having a problem will need new referral with that diagnosis.

## 2020-03-31 NOTE — Telephone Encounter (Signed)
Dr. Ronnald Ramp please advise. Is an OV needed to discuss?

## 2020-04-01 NOTE — Telephone Encounter (Signed)
Is another referral needed or does he need to reach out the the specialist he recently seen for DM exam?

## 2020-04-08 ENCOUNTER — Ambulatory Visit (HOSPITAL_COMMUNITY): Payer: HMO | Attending: Internal Medicine

## 2020-04-12 ENCOUNTER — Other Ambulatory Visit: Payer: Self-pay | Admitting: Internal Medicine

## 2020-04-12 DIAGNOSIS — E1042 Type 1 diabetes mellitus with diabetic polyneuropathy: Secondary | ICD-10-CM

## 2020-04-12 MED ORDER — INSULIN ASPART FLEXPEN 100 UNIT/ML ~~LOC~~ SOPN: 10.0000 [IU] | PEN_INJECTOR | SUBCUTANEOUS | 1 refills | Status: DC

## 2020-04-18 DIAGNOSIS — H903 Sensorineural hearing loss, bilateral: Secondary | ICD-10-CM | POA: Diagnosis not present

## 2020-04-22 ENCOUNTER — Inpatient Hospital Stay (HOSPITAL_COMMUNITY): Admission: RE | Admit: 2020-04-22 | Payer: HMO | Source: Ambulatory Visit

## 2020-04-27 ENCOUNTER — Ambulatory Visit (HOSPITAL_COMMUNITY): Payer: HMO

## 2020-05-05 ENCOUNTER — Other Ambulatory Visit: Payer: Self-pay

## 2020-05-05 ENCOUNTER — Ambulatory Visit (HOSPITAL_COMMUNITY)
Admission: RE | Admit: 2020-05-05 | Discharge: 2020-05-05 | Disposition: A | Discharge status: Home / Self Care | Payer: HMO | Source: Ambulatory Visit | Attending: Internal Medicine | Admitting: Internal Medicine

## 2020-05-05 DIAGNOSIS — I739 Peripheral vascular disease, unspecified: Secondary | ICD-10-CM | POA: Diagnosis not present

## 2020-05-09 ENCOUNTER — Ambulatory Visit: Payer: HMO | Admitting: Endocrinology

## 2020-05-16 ENCOUNTER — Encounter (HOSPITAL_BASED_OUTPATIENT_CLINIC_OR_DEPARTMENT_OTHER): Payer: Self-pay | Admitting: Emergency Medicine

## 2020-05-16 ENCOUNTER — Emergency Department (HOSPITAL_BASED_OUTPATIENT_CLINIC_OR_DEPARTMENT_OTHER): Payer: HMO | Admitting: Radiology

## 2020-05-16 ENCOUNTER — Inpatient Hospital Stay (HOSPITAL_BASED_OUTPATIENT_CLINIC_OR_DEPARTMENT_OTHER)
Admission: EM | Admit: 2020-05-16 | Discharge: 2020-05-24 | DRG: 205 | Disposition: A | Discharge status: Skilled Nursing Facility | Payer: HMO | Attending: Internal Medicine | Admitting: Internal Medicine

## 2020-05-16 ENCOUNTER — Ambulatory Visit: Payer: HMO | Admitting: Internal Medicine

## 2020-05-16 ENCOUNTER — Other Ambulatory Visit: Payer: Self-pay

## 2020-05-16 ENCOUNTER — Emergency Department (HOSPITAL_BASED_OUTPATIENT_CLINIC_OR_DEPARTMENT_OTHER): Payer: HMO

## 2020-05-16 DIAGNOSIS — Z043 Encounter for examination and observation following other accident: Secondary | ICD-10-CM | POA: Diagnosis not present

## 2020-05-16 DIAGNOSIS — M6281 Muscle weakness (generalized): Secondary | ICD-10-CM | POA: Diagnosis not present

## 2020-05-16 DIAGNOSIS — S42102D Fracture of unspecified part of scapula, left shoulder, subsequent encounter for fracture with routine healing: Secondary | ICD-10-CM | POA: Diagnosis not present

## 2020-05-16 DIAGNOSIS — R509 Fever, unspecified: Secondary | ICD-10-CM | POA: Diagnosis not present

## 2020-05-16 DIAGNOSIS — R0902 Hypoxemia: Principal | ICD-10-CM | POA: Diagnosis present

## 2020-05-16 DIAGNOSIS — D649 Anemia, unspecified: Secondary | ICD-10-CM | POA: Diagnosis not present

## 2020-05-16 DIAGNOSIS — Z794 Long term (current) use of insulin: Secondary | ICD-10-CM

## 2020-05-16 DIAGNOSIS — I10 Essential (primary) hypertension: Secondary | ICD-10-CM | POA: Diagnosis not present

## 2020-05-16 DIAGNOSIS — E785 Hyperlipidemia, unspecified: Secondary | ICD-10-CM | POA: Diagnosis not present

## 2020-05-16 DIAGNOSIS — J90 Pleural effusion, not elsewhere classified: Secondary | ICD-10-CM | POA: Diagnosis not present

## 2020-05-16 DIAGNOSIS — Z7901 Long term (current) use of anticoagulants: Secondary | ICD-10-CM | POA: Diagnosis not present

## 2020-05-16 DIAGNOSIS — S42102A Fracture of unspecified part of scapula, left shoulder, initial encounter for closed fracture: Secondary | ICD-10-CM | POA: Diagnosis present

## 2020-05-16 DIAGNOSIS — I480 Paroxysmal atrial fibrillation: Secondary | ICD-10-CM | POA: Diagnosis present

## 2020-05-16 DIAGNOSIS — S42132A Displaced fracture of coracoid process, left shoulder, initial encounter for closed fracture: Secondary | ICD-10-CM | POA: Diagnosis not present

## 2020-05-16 DIAGNOSIS — R739 Hyperglycemia, unspecified: Secondary | ICD-10-CM | POA: Diagnosis not present

## 2020-05-16 DIAGNOSIS — R609 Edema, unspecified: Secondary | ICD-10-CM | POA: Diagnosis not present

## 2020-05-16 DIAGNOSIS — M7989 Other specified soft tissue disorders: Secondary | ICD-10-CM | POA: Diagnosis not present

## 2020-05-16 DIAGNOSIS — S42122A Displaced fracture of acromial process, left shoulder, initial encounter for closed fracture: Secondary | ICD-10-CM | POA: Diagnosis present

## 2020-05-16 DIAGNOSIS — I428 Other cardiomyopathies: Secondary | ICD-10-CM | POA: Diagnosis present

## 2020-05-16 DIAGNOSIS — M47814 Spondylosis without myelopathy or radiculopathy, thoracic region: Secondary | ICD-10-CM | POA: Diagnosis not present

## 2020-05-16 DIAGNOSIS — I2510 Atherosclerotic heart disease of native coronary artery without angina pectoris: Secondary | ICD-10-CM | POA: Diagnosis present

## 2020-05-16 DIAGNOSIS — E559 Vitamin D deficiency, unspecified: Secondary | ICD-10-CM | POA: Diagnosis not present

## 2020-05-16 DIAGNOSIS — J690 Pneumonitis due to inhalation of food and vomit: Secondary | ICD-10-CM | POA: Diagnosis not present

## 2020-05-16 DIAGNOSIS — Z743 Need for continuous supervision: Secondary | ICD-10-CM | POA: Diagnosis not present

## 2020-05-16 DIAGNOSIS — T1490XA Injury, unspecified, initial encounter: Secondary | ICD-10-CM

## 2020-05-16 DIAGNOSIS — Z951 Presence of aortocoronary bypass graft: Secondary | ICD-10-CM

## 2020-05-16 DIAGNOSIS — R0602 Shortness of breath: Secondary | ICD-10-CM | POA: Diagnosis not present

## 2020-05-16 DIAGNOSIS — Z79899 Other long term (current) drug therapy: Secondary | ICD-10-CM

## 2020-05-16 DIAGNOSIS — W109XXA Fall (on) (from) unspecified stairs and steps, initial encounter: Secondary | ICD-10-CM | POA: Diagnosis present

## 2020-05-16 DIAGNOSIS — S42112A Displaced fracture of body of scapula, left shoulder, initial encounter for closed fracture: Secondary | ICD-10-CM | POA: Diagnosis not present

## 2020-05-16 DIAGNOSIS — Z9889 Other specified postprocedural states: Secondary | ICD-10-CM

## 2020-05-16 DIAGNOSIS — F17290 Nicotine dependence, other tobacco product, uncomplicated: Secondary | ICD-10-CM | POA: Diagnosis present

## 2020-05-16 DIAGNOSIS — L89152 Pressure ulcer of sacral region, stage 2: Secondary | ICD-10-CM | POA: Diagnosis not present

## 2020-05-16 DIAGNOSIS — F419 Anxiety disorder, unspecified: Secondary | ICD-10-CM | POA: Diagnosis present

## 2020-05-16 DIAGNOSIS — Y92009 Unspecified place in unspecified non-institutional (private) residence as the place of occurrence of the external cause: Secondary | ICD-10-CM

## 2020-05-16 DIAGNOSIS — J918 Pleural effusion in other conditions classified elsewhere: Secondary | ICD-10-CM | POA: Diagnosis not present

## 2020-05-16 DIAGNOSIS — R41841 Cognitive communication deficit: Secondary | ICD-10-CM | POA: Diagnosis not present

## 2020-05-16 DIAGNOSIS — R531 Weakness: Secondary | ICD-10-CM | POA: Diagnosis not present

## 2020-05-16 DIAGNOSIS — W108XXA Fall (on) (from) other stairs and steps, initial encounter: Secondary | ICD-10-CM | POA: Diagnosis present

## 2020-05-16 DIAGNOSIS — F32A Depression, unspecified: Secondary | ICD-10-CM | POA: Diagnosis present

## 2020-05-16 DIAGNOSIS — M25552 Pain in left hip: Secondary | ICD-10-CM | POA: Diagnosis not present

## 2020-05-16 DIAGNOSIS — R2689 Other abnormalities of gait and mobility: Secondary | ICD-10-CM | POA: Diagnosis not present

## 2020-05-16 DIAGNOSIS — R5381 Other malaise: Secondary | ICD-10-CM | POA: Diagnosis not present

## 2020-05-16 DIAGNOSIS — L8990 Pressure ulcer of unspecified site, unspecified stage: Secondary | ICD-10-CM | POA: Insufficient documentation

## 2020-05-16 DIAGNOSIS — R2681 Unsteadiness on feet: Secondary | ICD-10-CM | POA: Diagnosis not present

## 2020-05-16 DIAGNOSIS — M47812 Spondylosis without myelopathy or radiculopathy, cervical region: Secondary | ICD-10-CM | POA: Diagnosis not present

## 2020-05-16 DIAGNOSIS — I499 Cardiac arrhythmia, unspecified: Secondary | ICD-10-CM | POA: Diagnosis not present

## 2020-05-16 DIAGNOSIS — Z20822 Contact with and (suspected) exposure to covid-19: Secondary | ICD-10-CM | POA: Diagnosis present

## 2020-05-16 DIAGNOSIS — I4891 Unspecified atrial fibrillation: Secondary | ICD-10-CM | POA: Diagnosis not present

## 2020-05-16 DIAGNOSIS — E1165 Type 2 diabetes mellitus with hyperglycemia: Secondary | ICD-10-CM | POA: Diagnosis present

## 2020-05-16 DIAGNOSIS — S42136A Nondisplaced fracture of coracoid process, unspecified shoulder, initial encounter for closed fracture: Secondary | ICD-10-CM | POA: Diagnosis not present

## 2020-05-16 LAB — RESP PANEL BY RT-PCR (FLU A&B, COVID) ARPGX2
Influenza A by PCR: NEGATIVE
Influenza B by PCR: NEGATIVE
SARS Coronavirus 2 by RT PCR: NEGATIVE

## 2020-05-16 LAB — TYPE AND SCREEN
ABO/RH(D): O POS
Antibody Screen: NEGATIVE

## 2020-05-16 LAB — RETICULOCYTES
Immature Retic Fract: 12.7 % (ref 2.3–15.9)
RBC.: 2.97 MIL/uL — ABNORMAL LOW (ref 4.22–5.81)
Retic Count, Absolute: 82 10*3/uL (ref 19.0–186.0)
Retic Ct Pct: 2.8 % (ref 0.4–3.1)

## 2020-05-16 LAB — BASIC METABOLIC PANEL
Anion gap: 6 (ref 5–15)
BUN: 12 mg/dL (ref 8–23)
CO2: 27 mmol/L (ref 22–32)
Calcium: 7.9 mg/dL — ABNORMAL LOW (ref 8.9–10.3)
Chloride: 101 mmol/L (ref 98–111)
Creatinine, Ser: 0.55 mg/dL — ABNORMAL LOW (ref 0.61–1.24)
GFR, Estimated: >60 mL/min (ref >60)
Glucose, Bld: 407 mg/dL — ABNORMAL HIGH (ref 70–99)
Potassium: 4.2 mmol/L (ref 3.5–5.1)
Sodium: 134 mmol/L — ABNORMAL LOW (ref 135–145)

## 2020-05-16 LAB — IRON AND TIBC
Iron: 33 ug/dL — ABNORMAL LOW (ref 45–182)
Saturation Ratios: 12 % — ABNORMAL LOW (ref 17.9–39.5)
TIBC: 273 ug/dL (ref 250–450)
UIBC: 240 ug/dL

## 2020-05-16 LAB — HEMOGLOBIN A1C
Hgb A1c MFr Bld: 12.7 % — ABNORMAL HIGH (ref 4.8–5.6)
Mean Plasma Glucose: 317.79 mg/dL

## 2020-05-16 LAB — FERRITIN: Ferritin: 418 ng/mL — ABNORMAL HIGH (ref 24–336)

## 2020-05-16 LAB — CBC
HCT: 31.7 % — ABNORMAL LOW (ref 39.0–52.0)
Hemoglobin: 10.3 g/dL — ABNORMAL LOW (ref 13.0–17.0)
MCH: 32.2 pg (ref 26.0–34.0)
MCHC: 32.5 g/dL (ref 30.0–36.0)
MCV: 99.1 fL (ref 80.0–100.0)
Platelets: 152 10*3/uL (ref 150–400)
RBC: 3.2 MIL/uL — ABNORMAL LOW (ref 4.22–5.81)
RDW: 13.2 % (ref 11.5–15.5)
WBC: 7.3 10*3/uL (ref 4.0–10.5)
nRBC: 0 % (ref 0.0–0.2)

## 2020-05-16 LAB — GLUCOSE, CAPILLARY: Glucose-Capillary: 369 mg/dL — ABNORMAL HIGH (ref 70–99)

## 2020-05-16 LAB — VITAMIN B12: Vitamin B-12: 337 pg/mL (ref 180–914)

## 2020-05-16 LAB — HIV ANTIBODY (ROUTINE TESTING W REFLEX): HIV Screen 4th Generation wRfx: NONREACTIVE

## 2020-05-16 LAB — TSH: TSH: 2.845 u[IU]/mL (ref 0.350–4.500)

## 2020-05-16 LAB — FOLATE: Folate: 26 ng/mL (ref >5.9)

## 2020-05-16 LAB — T4, FREE: Free T4: 1.23 ng/dL — ABNORMAL HIGH (ref 0.61–1.12)

## 2020-05-16 MED ORDER — MORPHINE SULFATE (PF) 2 MG/ML IV SOLN
2.0000 mg | INTRAVENOUS | Status: DC | PRN
  Administered 2020-05-16 – 2020-05-17 (×4): 2 mg via INTRAVENOUS
  Filled 2020-05-16 (×4): qty 1

## 2020-05-16 MED ORDER — MORPHINE SULFATE (PF) 4 MG/ML IV SOLN
4.0000 mg | Freq: Once | INTRAVENOUS | Status: AC
  Administered 2020-05-16: 4 mg via INTRAVENOUS
  Filled 2020-05-16: qty 1

## 2020-05-16 MED ORDER — ACETAMINOPHEN 500 MG PO TABS
1000.0000 mg | ORAL_TABLET | Freq: Once | ORAL | Status: AC
  Administered 2020-05-16: 1000 mg via ORAL
  Filled 2020-05-16: qty 2

## 2020-05-16 MED ORDER — INSULIN ASPART 100 UNIT/ML IJ SOLN
0.0000 [IU] | Freq: Every day | INTRAMUSCULAR | Status: DC
  Administered 2020-05-16: 5 [IU] via SUBCUTANEOUS
  Administered 2020-05-20: 2 [IU] via SUBCUTANEOUS

## 2020-05-16 MED ORDER — PAROXETINE HCL 20 MG PO TABS
20.0000 mg | ORAL_TABLET | Freq: Every day | ORAL | Status: DC
  Administered 2020-05-17 – 2020-05-24 (×8): 20 mg via ORAL
  Filled 2020-05-16 (×9): qty 1

## 2020-05-16 MED ORDER — SODIUM CHLORIDE 0.9% FLUSH
3.0000 mL | INTRAVENOUS | Status: DC | PRN
  Administered 2020-05-16: 3 mL via INTRAVENOUS

## 2020-05-16 MED ORDER — MIRTAZAPINE 15 MG PO TABS
15.0000 mg | ORAL_TABLET | Freq: Every day | ORAL | Status: DC
  Administered 2020-05-16 – 2020-05-23 (×8): 15 mg via ORAL
  Filled 2020-05-16 (×9): qty 1

## 2020-05-16 MED ORDER — PANTOPRAZOLE SODIUM 40 MG IV SOLR
40.0000 mg | INTRAVENOUS | Status: DC
  Administered 2020-05-16 – 2020-05-17 (×2): 40 mg via INTRAVENOUS
  Filled 2020-05-16 (×3): qty 40

## 2020-05-16 MED ORDER — INSULIN ASPART 100 UNIT/ML IJ SOLN
0.0000 [IU] | Freq: Three times a day (TID) | INTRAMUSCULAR | Status: DC
  Administered 2020-05-17 (×2): 5 [IU] via SUBCUTANEOUS
  Administered 2020-05-17: 11 [IU] via SUBCUTANEOUS
  Administered 2020-05-18 (×2): 2 [IU] via SUBCUTANEOUS
  Administered 2020-05-18: 3 [IU] via SUBCUTANEOUS
  Administered 2020-05-19: 2 [IU] via SUBCUTANEOUS
  Administered 2020-05-19 – 2020-05-21 (×4): 3 [IU] via SUBCUTANEOUS
  Administered 2020-05-21: 5 [IU] via SUBCUTANEOUS
  Administered 2020-05-22: 2 [IU] via SUBCUTANEOUS
  Administered 2020-05-23 – 2020-05-24 (×4): 3 [IU] via SUBCUTANEOUS

## 2020-05-16 MED ORDER — MORPHINE SULFATE (PF) 2 MG/ML IV SOLN
2.0000 mg | Freq: Once | INTRAVENOUS | Status: AC
Start: 2020-05-16 — End: 2020-05-16
  Administered 2020-05-16: 2 mg via INTRAVENOUS
  Filled 2020-05-16: qty 1

## 2020-05-16 MED ORDER — HYDRALAZINE HCL 20 MG/ML IJ SOLN: 10.0000 mg | Freq: Four times a day (QID) | INTRAMUSCULAR | Status: DC | PRN

## 2020-05-16 MED ORDER — ATORVASTATIN CALCIUM 40 MG PO TABS
40.0000 mg | ORAL_TABLET | Freq: Every day | ORAL | Status: DC
  Administered 2020-05-17 – 2020-05-24 (×8): 40 mg via ORAL
  Filled 2020-05-16 (×9): qty 1

## 2020-05-16 MED ORDER — TRAZODONE HCL 50 MG PO TABS
25.0000 mg | ORAL_TABLET | Freq: Every day | ORAL | Status: DC
  Administered 2020-05-16 – 2020-05-23 (×8): 25 mg via ORAL
  Filled 2020-05-16 (×4): qty 1
  Filled 2020-05-16: qty 0.5
  Filled 2020-05-16 (×4): qty 1

## 2020-05-16 MED ORDER — SODIUM CHLORIDE 0.9% FLUSH
3.0000 mL | Freq: Two times a day (BID) | INTRAVENOUS | Status: DC
  Administered 2020-05-16 – 2020-05-21 (×8): 3 mL via INTRAVENOUS

## 2020-05-16 MED ORDER — SODIUM CHLORIDE 0.9 % IV SOLN
250.0000 mL | INTRAVENOUS | Status: DC | PRN
  Administered 2020-05-24: 250 mL via INTRAVENOUS

## 2020-05-16 NOTE — ED Notes (Signed)
Pt transferred to [facility] via [facility] at this time

## 2020-05-16 NOTE — Progress Notes (Signed)
[facility] to [facility] transfer:  Patient with h/o HTN; HLD; DM; CAD; and afib on Eliquis presenting after a fall.  He appears to need medical admission for borderline pleural effusion with borderline hypoxia.  He had a fall down the stairs at home - missed his footing at the top of the stairs.  Came in complaining of shoulder pain, also hit posterior head.  Has a scapular fracture, likely non-surgical.  Also with B pleural effusions, unaware of prior h/o.  Spoke with trauma service - Dr. Grandville Silos thinks these are unrelated to the trauma and not blood.  Recommend medical admission for evaluation of pleural effusions.  Dr. Lorin Mercy will consult upon arrival, recommends arm sling.  Patient is not hypoxic and without respiratory complaints.  Will accept in observation on telemetry for now, can go to either [facility] or [facility] at this time.     Carlyon Shadow, M.D.

## 2020-05-16 NOTE — ED Triage Notes (Addendum)
Patient arrives to ED with c/o of fall this morning. Pt states he fell down 17 wooden stairs inside his home. Pt had a mechanical fall by missing a step causing him to fall head first. Pt has pain to left shoulder, neck, and head. Pt states he had LOC for a brief period after falling. Pt is taking Eliquis for Afib.

## 2020-05-16 NOTE — Progress Notes (Addendum)
Reviewed xrays and CT chest after call about his left closed scapula fracture. Should do well with non op management. Will see pt tomorrow once he is transferred to [facility] Trauma Service. Sling for left arm.  My cell [phone]

## 2020-05-16 NOTE — ED Provider Notes (Signed)
[facility] EMERGENCY DEPT Provider Note   CSN: [id number] Arrival date & time: 05/16/20  5638     History Chief Complaint  Patient presents with  . Fall    [name] [name] is a 65 y.o. male w/ hx of diabetes, A Fib on Eliquis, presenting to ED with a fall down stairs at home.  Occurred this morning.  He reports he missed his footing and fell down approximately 17 wooden steps.  He cried out for help when he reached a landing, and there were painters available on scene were able to come in and immediately call 911.  He denies loss of consciousness.  He thinks maybe he struck his head.  He reports he is having pain mostly in his left shoulder, as well as left knee.  Denies HA, chest pain, numbness or weakness of extremities.  HPI     Past Medical History:  Diagnosis Date  . Anxiety   . Aortic dissection ([place])   . Arthritis   . Constipation    pt states goes EOD- hard stools   . Coronary artery disease   . Depression   . Diabetes mellitus without complication ([place])   . DKA (diabetic ketoacidoses)   . Hyperlipidemia   . Hypertension    off meds  . Psoriasis     Patient Active Problem List   Diagnosis Date Noted  . Fall (on) (from) other stairs and steps, initial encounter 05/16/2020  . BPH with obstruction/lower urinary tract symptoms 03/16/2020  . PAF (paroxysmal atrial fibrillation) ([place]) 03/16/2020  . Viral wart on finger 03/16/2020  . Type 1 diabetes mellitus with diabetic polyneuropathy ([place]) 01/27/2020  . Deficiency anemia 01/13/2020  . Encounter for general adult medical examination with abnormal findings 01/13/2020  . Hyperlipidemia LDL goal <70 01/13/2020  . Benign prostatic hyperplasia without lower urinary tract symptoms 01/13/2020  . Claudication of both lower extremities ([place]) 01/13/2020  . Coronary artery disease involving bypass graft of transplanted heart without angina pectoris 01/13/2020  . Paroxysmal atrial fibrillation ([place])  01/13/2020  . Recurrent major depressive disorder, in partial remission ([place]) 01/13/2020  . Aortic dissection ([place]) 02/05/2019  . Coronary artery disease 02/05/2019  . Hyperlipidemia 02/05/2019  . Hypertension 02/05/2019  . Psoriasis 02/05/2019  . Vitamin D deficiency 01/07/2019  . Uncontrolled type 2 diabetes mellitus with hyperglycemia ([place]) 01/07/2019  . Microalbuminuria 01/07/2019  . Depression 06/10/2018  . DKA (diabetic ketoacidoses) 06/10/2018  . Non-intractable vomiting 06/10/2018  . Patellar tendonitis of right knee 04/24/2018  . Ulnar neuropathy of left upper extremity 07/12/2017  . Peyronie's disease 06/09/2015  . Erectile dysfunction 03/15/2015  . S/P aortic aneurysm repair 05/19/2012  . S/P CABG (coronary artery bypass graft) 05/19/2012  . Non-proliferative diabetic retinopathy ([place]) 02/27/2011  . Contracture of hand joint 11/01/2010  . Dupuytren's contracture of both hands 11/01/2010    Past Surgical History:  Procedure Laterality Date  . ANKLE SURGERY     right  . CARDIAC CATHETERIZATION  09/25/2001  . COLONOSCOPY     ~10 yr ago- normal  per pt   . CORONARY ARTERY BYPASS GRAFT  09/30/2001   CABG x 4  . LAPAROSCOPIC INGUINAL HERNIA REPAIR Bilateral 02/09/2010  . MASS EXCISION Left 03/12/2013   Procedure: LEFT INDEX EXCISION MASS AND DEBRIDMENT DISTAL INTERPHALANGEAL JOINT;  Surgeon: Tennis Must, MD;  Location: [facility];  Service: Orthopedics;  Laterality: Left;       Family History  Problem Relation Age of Onset  .  Lymphoma Father   . Cancer Paternal Grandmother        Colon Cancer  . Colon cancer Paternal Grandmother   . Colon polyps Neg Hx   . Esophageal cancer Neg Hx   . Rectal cancer Neg Hx   . Stomach cancer Neg Hx     Social History   Tobacco Use  . Smoking status: Current Some Day Smoker    Types: Cigars  . Smokeless tobacco: Never Used  Vaping Use  . Vaping Use: Never used  Substance Use Topics  . Alcohol use: Yes     Comment: daily beer or scotch   . Drug use: No    Home Medications Prior to Admission medications   Medication Sig Start Date End Date Taking? Authorizing Provider  apixaban (ELIQUIS) 5 MG TABS tablet Take 1 tablet (5 mg total) by mouth 2 (two) times daily. 03/16/20   Janith Lima, MD  atorvastatin (LIPITOR) 40 MG tablet Take 1 tablet (40 mg total) by mouth daily. 01/13/20   Janith Lima, MD  B Complex Vitamins (B COMPLEX-B12 PO) Take 1 tablet by mouth daily.     [provider]  Cholecalciferol (VITAMIN D) 125 MCG (5000 UT) CAPS Take 5,000 Units by mouth daily.     [provider]  Continuous Blood Gluc Receiver (FREESTYLE LIBRE 14 DAY READER) DEVI 1 each by Does not apply route daily. 01/27/20   Janith Lima, MD  Continuous Blood Gluc Sensor (FREESTYLE LIBRE 14 DAY SENSOR) MISC 1 each by Does not apply route daily. 01/27/20   Janith Lima, MD  Insulin Aspart FlexPen 100 UNIT/ML SOPN Inject 10-20 Units into the skin See admin instructions. Injects10-20 units into the skin three times daily per sliding scale. 04/12/20   Janith Lima, MD  insulin glargine (LANTUS) 100 UNIT/ML injection Inject 0.5 mLs (50 Units total) into the skin at bedtime. 01/27/20   Janith Lima, MD  Insulin Pen Needle (DROPLET PEN NEEDLES) 32G X 4 MM MISC Use daily for glucose control 01/27/20   Janith Lima, MD  lisinopril (ZESTRIL) 5 MG tablet TAKE 1 TABLET BY MOUTH DAILY Patient taking differently: Take 5 mg by mouth daily. 08/04/19   Isaac Bliss, Rayford Halsted, MD  mirtazapine (REMERON) 15 MG tablet Take 1 tablet (15 mg total) by mouth at bedtime. 01/13/20   Janith Lima, MD  PARoxetine (PAXIL) 20 MG tablet Take 1 tablet (20 mg total) by mouth daily. 01/13/20   Janith Lima, MD  traZODone (DESYREL) 50 MG tablet Take 1 tablet (50 mg total) by mouth at bedtime. 01/13/20   Janith Lima, MD    Allergies    Patient has no known allergies.  Review of Systems   Review of Systems   Constitutional: Negative for chills and fever.  HENT: Negative for ear pain and sore throat.   Eyes: Negative for pain and visual disturbance.  Respiratory: Negative for cough and shortness of breath.   Cardiovascular: Negative for chest pain and palpitations.  Gastrointestinal: Negative for abdominal pain and vomiting.  Genitourinary: Negative for dysuria and hematuria.  Musculoskeletal: Positive for arthralgias, myalgias and neck pain.  Skin: Negative for color change and rash.  Neurological: Negative for syncope, light-headedness, numbness and headaches.  All other systems reviewed and are negative.   Physical Exam Updated Vital Signs BP 126/73   Pulse (!) 59   Temp 98.3 F (36.8 C) (Oral)   Resp 20   Ht 6\' 2"  (  1.88 m)   Wt 79.4 kg   SpO2 95%   BMI 22.47 kg/m   Physical Exam Constitutional:      General: He is not in acute distress. HENT:     Head: Normocephalic.     Comments: Small 1 cm laceration to occiput, bleeding controlled Eyes:     Conjunctiva/sclera: Conjunctivae normal.     Pupils: Pupils are equal, round, and reactive to light.  Neck:     Comments: Chronic kyphosis of the spine.  No isolated cervical spinal midline tenderness.  Mild thoracic spinal and paraspinal tenderness with an abrasion near left paraspinal region near scapula Cardiovascular:     Rate and Rhythm: Normal rate. Rhythm irregular.     Pulses: Normal pulses.     Comments: HR normal Pulmonary:     Effort: Pulmonary effort is normal. No respiratory distress.     Comments: 90% at rest on room air, diminished BS in lung bases Abdominal:     General: There is no distension.     Tenderness: There is no abdominal tenderness.  Musculoskeletal:     Comments: Left posterior rib tenderness left scapular tenderness. No clavicle tenderness or deformities. Tenderness of the left humeral head. Isolated ttp of left fibula head, on visible deformity of left knee Full range of motion at the bilateral  hips with minimal pain or tenderness, no pelvic instability.Bilateral ankles with full range of motion, no isolated tenderness of the lateral or medial posterior malleoli.  No isolated tenderness of the midfoot or at the base the fifth metatarsal. No other bony deformities or tenderness noted including the bilateral hands, wrists, forearms, elbows, right shoulder.   Skin:    General: Skin is warm and dry.  Neurological:     General: No focal deficit present.     Mental Status: He is alert. Mental status is at baseline.     ED Results / Procedures / Treatments   Labs (all labs ordered are listed, but only abnormal results are displayed) Labs Reviewed  BASIC METABOLIC PANEL - Abnormal; Notable for the following components:      Result Value   Sodium 134 (*)    Glucose, Bld 407 (*)    Creatinine, Ser 0.55 (*)    Calcium 7.9 (*)    All other components within normal limits  CBC - Abnormal; Notable for the following components:   RBC 3.20 (*)    Hemoglobin 10.3 (*)    HCT 31.7 (*)    All other components within normal limits  RESP PANEL BY RT-PCR (FLU A&B, COVID) [id number]    EKG EKG Interpretation  Date/Time:  Monday May 16 2020 10:03:52 EDT Ventricular Rate:  68 PR Interval:    QRS Duration: 89 QT Interval:  459 QTC Calculation: 489 R Axis:   53 Text Interpretation: Atrial fibrillation  No STEMI Reconfirmed by Octaviano Glow [id number]) on 05/16/2020 10:22:33 AM   Radiology DG Pelvis 1-2 Views  Result Date: 05/16/2020 CLINICAL DATA:  Left hip pain after fall today. EXAM: PELVIS - 1-2 VIEW COMPARISON:  None. FINDINGS: There is no evidence of pelvic fracture or diastasis. No pelvic bone lesions are seen. IMPRESSION: Negative. Electronically Signed   By: Marijo Conception M.D.   On: 05/16/2020 11:30   CT Head Wo Contrast  Result Date: 05/16/2020 CLINICAL DATA:  Fall down stairs EXAM: CT HEAD WITHOUT CONTRAST TECHNIQUE: Contiguous axial images were obtained from the base of the  skull through the vertex without intravenous contrast. COMPARISON:  None. FINDINGS: Brain: No acute intracranial abnormality. Specifically, no hemorrhage, hydrocephalus, mass lesion, acute infarction, or significant intracranial injury. Vascular: No hyperdense vessel or unexpected calcification. Skull: No acute calvarial abnormality. Sinuses/Orbits: No acute findings Other: None IMPRESSION: No acute intracranial abnormality. Electronically Signed   By: Rolm Baptise M.D.   On: 05/16/2020 11:26   CT Chest Wo Contrast  Result Date: 05/16/2020 CLINICAL DATA:  Left chest and scapular pain after fall down stairs EXAM: CT CHEST WITHOUT CONTRAST TECHNIQUE: Multidetector CT imaging of the chest was performed following the standard protocol without IV contrast. COMPARISON:  Chest x-ray 12/07/2019 FINDINGS: Cardiovascular: Heart size is normal. No pericardial effusion. Thoracic aorta is nonaneurysmal. There are atherosclerotic calcifications of the aorta and coronary arteries. Patient is status post CABG. Mediastinum/Nodes: No axillary, mediastinal, or hilar lymphadenopathy evident on non contrasted exam. Thyroid, trachea, and esophagus appear within normal limits. Lungs/Pleura: Moderate bilateral pleural effusions with associated compressive atelectasis. No pneumothorax. Upper Abdomen: No acute abnormality. Musculoskeletal: Acute mildly comminuted and relatively nondisplaced fracture of the left scapular spine. Acute fracture of the superior border of the left coracoid process which is superiorly displaced by approximately 11 mm (series 6, image 168). Acromioclavicular and glenohumeral joint alignment appears maintained without dislocation. No acute rib fracture. Thoracic vertebral body heights and alignment are maintained without evidence of fracture. Soft tissue edema and small amount of hemorrhage at the fracture sites. No well-defined hematoma. IMPRESSION: 1. Acute fractures of the left scapula including a mildly  comminuted and relatively nondisplaced fracture of the left scapular spine. Acute fracture of the superior border of the coracoid process which is superiorly displaced by approximately 11 mm. 2. Negative for rib fracture. 3. Moderate bilateral pleural effusions with associated compressive atelectasis. No pneumothorax. Aortic Atherosclerosis (ICD10-I70.0). Electronically Signed   By: Davina Poke D.O.   On: 05/16/2020 11:59   CT Cervical Spine Wo Contrast  Result Date: 05/16/2020 CLINICAL DATA:  Fall down stairs. EXAM: CT CERVICAL SPINE WITHOUT CONTRAST TECHNIQUE: Multidetector CT imaging of the cervical spine was performed without intravenous contrast. Multiplanar CT image reconstructions were also generated. COMPARISON:  None. FINDINGS: Alignment: Normal Skull base and vertebrae: No acute fracture. No primary bone lesion or focal pathologic process. Soft tissues and spinal canal: No prevertebral fluid or swelling. No visible canal hematoma. Disc levels: Degenerative disc disease at C5-6 and C6-7 with disc space narrowing and spurring. Bilateral degenerative facet disease, left greater than right. Upper chest: Large bilateral pleural effusions partially imaged. Other: None IMPRESSION: Cervical spondylosis.  No acute bony abnormality. Large bilateral pleural effusions. Electronically Signed   By: Rolm Baptise M.D.   On: 05/16/2020 11:27   CT T-SPINE NO CHARGE  Result Date: 05/16/2020 CLINICAL DATA:  Fall down stairs. EXAM: CT THORACIC SPINE WITHOUT CONTRAST TECHNIQUE: Multidetector CT images of the thoracic were obtained using the standard protocol without intravenous contrast. COMPARISON:  None. FINDINGS: Alignment: Normal. Vertebrae: No definite acute fracture is identified. Curvilinear lucency in the left T3 transverse process appears partially corticated and is favored to reflect a vascular channel rather than nondisplaced fracture with less conspicuous suspected vascular channels and/or artifact seen  in other transverse processes including on the right at T3. Paraspinal and other soft tissues: No acute abnormality identified in the paraspinal soft tissues. Intrathoracic contents reported separately. Disc levels: Mild thoracic spondylosis without evidence of a high-grade stenosis. IMPRESSION: No acute osseous abnormality identified in the thoracic spine. Electronically Signed   By: Logan Bores M.D.   On: 05/16/2020  11:40   DG Shoulder Left  Result Date: 05/16/2020 CLINICAL DATA:  Fall down stairs.  Left shoulder pain. EXAM: LEFT SHOULDER - 2+ VIEW COMPARISON:  None. FINDINGS: There is a fracture through the left scapula at the base of the acromion, minimal displacement. Degenerative changes in the [facility] joint. Visualized humerus unremarkable. No dislocation. IMPRESSION: Scapular fracture at the base of the left acromion. Electronically Signed   By: Rolm Baptise M.D.   On: 05/16/2020 11:25   DG Knee Complete 4 Views Left  Result Date: 05/16/2020 CLINICAL DATA:  Left knee swelling after fall today. EXAM: LEFT KNEE - COMPLETE 4+ VIEW COMPARISON:  None. FINDINGS: No evidence of fracture, dislocation, or joint effusion. No evidence of arthropathy or other focal bone abnormality. Soft tissues are unremarkable. IMPRESSION: Negative. Electronically Signed   By: Marijo Conception M.D.   On: 05/16/2020 11:29   DG Humerus Left  Result Date: 05/16/2020 CLINICAL DATA:  Left shoulder pain after fall down stairs today. EXAM: LEFT HUMERUS - 2+ VIEW COMPARISON:  None. FINDINGS: Mildly displaced fracture is seen involving the left acromion. Visualized humerus is unremarkable. IMPRESSION: Mildly displaced left acromial fracture. Electronically Signed   By: Marijo Conception M.D.   On: 05/16/2020 11:28    Procedures Procedures   Medications Ordered in ED Medications  acetaminophen (TYLENOL) tablet 1,000 mg (1,000 mg Oral Given 05/16/20 1138)  morphine 2 MG/ML injection 2 mg (2 mg Intravenous Given 05/16/20 1136)   morphine 4 MG/ML injection 4 mg (4 mg Intravenous Given 05/16/20 1529)    ED Course  I have reviewed the triage vital signs and the nursing notes.  Pertinent labs & imaging results that were available during my care of the patient were reviewed by me and considered in my medical decision making (see chart for details).  65 year old on Eliquis presenting to emergency department with a mechanical fall down multiple stairs at home.  This was a significant traumatic mechanism.  He has very small laceration back of the head is bleeding is controlled, which will be too small for sutures or staples.  He has no headache.  Based on his age and his physical exam, I have ordered a CT scan of the head, cervical spine, chest, and thoracic spine.  I have also ordered x-rays of the left humerus and shoulder, left knee, and pelvis for trauma evaluation.  Tylenol ordered for pain.  Although he has no neck tenderness or midline cervical tenderness, he does have a distracting injury with left shoulder pain.  I've instructed his nurse to apply cervical collar pending imaging.  Low suspicion overall for pelvic or open book fracture. EKG reviewed showing the patient is in chronic A. fib, rate controlled.  Prior medical records reviewed -dg chest from Dec 2021 in the ED showing no pleural effusion.  This appears to be a new process.  Admission labs ordered and reviewed, notable for hyperglycemia BS 407 with normal anion gap, doubt DKA or HHS.  Hgb 10.3.  Ca mildly low at 7.9.  Covid/flu screening negative.  IV morphine and tylenol ordered for pain  CT scans reviewed.  No ICH or acute spinal fx noted.  CT chest demonstrating moderate bilateral pleural effusions, no evident rib fx, and left corocoid process fx with minimally displaced L scapular spine fx.  Patient is neurovascularly intact on exam.  Case discussed with Dr Lorin Mercy from orthopedics by phone, who will review imaging, but has no immediate recommendations  aside from shoulder mobilization.  Non-operative management recommended.  He is okay with patient transfer to [facility] or [facility] hospital.   Clinical Course as of 05/16/20 1737  Mon May 16, 2020  1022 Please note, EKG initially erroneously dictated and signed as "V6 STEMI," but was meant to read "No STEMI."  I have submitted a change request for ECG. [MT]  57 I spoke to [place] ([place]), with Dr Grandville Silos from trauma surgery present, regarding the pleural effusions. They do not feel his effusions are likely to be related to the patient's isolated traumatic injury of the scapula, and they have no further treatment recommendations regarding the patient's noted injuries.  I am concerned that the patient continues to demonstrate some borderline hypoxia with an O2 saturation of 90% on room air, and that these effusions appear to be new and sizable from prior x-ray in December 2021.  It is not clear whether this may be a new malignancy or another process.  I therefore discussed with the patient and medical admission and have spoken to Dr Lorin Mercy, the hospitalist, who accepted pt for observation admission to [facility] or [facility], whenever a bed becomes available. [MT]    Clinical Course User Index [MT] Wyvonnia Dusky, MD    Final Clinical Impression(s) / ED Diagnoses Final diagnoses:  Trauma  Closed fracture of left scapula, unspecified part of scapula, initial encounter  Closed displaced fracture of coracoid process of left shoulder, initial encounter  Hyperglycemia    Rx / DC Orders ED Discharge Orders    None       Wyvonnia Dusky, MD 05/16/20 [phone]

## 2020-05-16 NOTE — Progress Notes (Signed)
Patient arrived on the unit from [place]; [facility] admitting notified.

## 2020-05-16 NOTE — H&P (Signed)
History and Physical    [name] [name] [id number] DOB: 1955/10/30 DOA: 05/16/2020  PCP: Janith Lima, MD    Patient coming from:  [facility]   Chief Complaint:  Hypoxia    HPI: [name] [name] is a 65 y.o. male seen in [facility] as direct admit from [place], pleural effusion with hypoxia.  Patient had a fall down the stairs this morning and comes in today with shoulder pain with a scapular fracture, patient also hit his head but no loss of consciousness.  Case was discussed with Dr. Rodell Perna, orthopedic MD who recommended medical admission, nonoperative management of the left closed scapular fracture, with a sling.  Orthopedics is planning to see patient tomorrow. Pt states that he was on his way to open the door and he missed the first step. No LOC, no dizziness, no palpitation. He does not have cardiologist and does not know about his pleural effusion.   Pt has past medical history of A. fib on Eliquis, hypertension, diabetes mellitus type 2, coronary artery disease, hyperlipidemia, tobacco abuse.  ED Course:  Vitals:   05/16/20 1700 05/16/20 1730 05/16/20 1745 05/16/20 1841  BP: 129/72 116/68  118/71  Pulse: 62 (!) 59 62 62  Resp: 14  20 17   Temp:    98.8 F (37.1 C)  TempSrc:    Oral  SpO2: (!) 87%  91% 94%  Weight:      Height:      ED course [facility], patient received morphine for pain control and Tylenol.  CMP today shows mild hyponatremia with a sodium of 134, glucose of 4 7, creatinine of 0.55, CBC showed a hemoglobin of 10.3, negative COVID.  CT chest shows the nondisplaced left scapular fracture please see full report below, head CT negative for any acute findings.  Review of Systems:  Review of Systems  Musculoskeletal: Positive for falls and joint pain.  All other systems reviewed and are negative.    Past Medical History:  Diagnosis Date  . Anxiety   . Aortic dissection ([place])   . Arthritis   . Constipation    pt states  goes EOD- hard stools   . Coronary artery disease   . Depression   . Diabetes mellitus without complication ([place])   . DKA (diabetic ketoacidoses)   . Hyperlipidemia   . Hypertension    off meds  . Psoriasis     Past Surgical History:  Procedure Laterality Date  . ANKLE SURGERY     right  . CARDIAC CATHETERIZATION  09/25/2001  . COLONOSCOPY     ~10 yr ago- normal  per pt   . CORONARY ARTERY BYPASS GRAFT  09/30/2001   CABG x 4  . LAPAROSCOPIC INGUINAL HERNIA REPAIR Bilateral 02/09/2010  . MASS EXCISION Left 03/12/2013   Procedure: LEFT INDEX EXCISION MASS AND DEBRIDMENT DISTAL INTERPHALANGEAL JOINT;  Surgeon: Tennis Must, MD;  Location: [facility];  Service: Orthopedics;  Laterality: Left;     reports that he has been smoking cigars. He has never used smokeless tobacco. He reports current alcohol use. He reports that he does not use drugs.  No Known Allergies  Family History  Problem Relation Age of Onset  . Lymphoma Father   . Cancer Paternal Grandmother        Colon Cancer  . Colon cancer Paternal Grandmother   . Colon polyps Neg Hx   . Esophageal cancer Neg Hx   . Rectal cancer Neg  Hx   . Stomach cancer Neg Hx     Prior to Admission medications   Medication Sig Start Date End Date Taking? Authorizing Provider  apixaban (ELIQUIS) 5 MG TABS tablet Take 1 tablet (5 mg total) by mouth 2 (two) times daily. 03/16/20   Janith Lima, MD  atorvastatin (LIPITOR) 40 MG tablet Take 1 tablet (40 mg total) by mouth daily. 01/13/20   Janith Lima, MD  B Complex Vitamins (B COMPLEX-B12 PO) Take 1 tablet by mouth daily.     [provider]  Cholecalciferol (VITAMIN D) 125 MCG (5000 UT) CAPS Take 5,000 Units by mouth daily.     [provider]  Continuous Blood Gluc Receiver (FREESTYLE LIBRE 14 DAY READER) DEVI 1 each by Does not apply route daily. 01/27/20   Janith Lima, MD  Continuous Blood Gluc Sensor (FREESTYLE LIBRE 14 DAY SENSOR) MISC 1 each  by Does not apply route daily. 01/27/20   Janith Lima, MD  Insulin Aspart FlexPen 100 UNIT/ML SOPN Inject 10-20 Units into the skin See admin instructions. Injects10-20 units into the skin three times daily per sliding scale. 04/12/20   Janith Lima, MD  insulin glargine (LANTUS) 100 UNIT/ML injection Inject 0.5 mLs (50 Units total) into the skin at bedtime. 01/27/20   Janith Lima, MD  Insulin Pen Needle (DROPLET PEN NEEDLES) 32G X 4 MM MISC Use daily for glucose control 01/27/20   Janith Lima, MD  lisinopril (ZESTRIL) 5 MG tablet TAKE 1 TABLET BY MOUTH DAILY Patient taking differently: Take 5 mg by mouth daily. 08/04/19   Isaac Bliss, Rayford Halsted, MD  mirtazapine (REMERON) 15 MG tablet Take 1 tablet (15 mg total) by mouth at bedtime. 01/13/20   Janith Lima, MD  PARoxetine (PAXIL) 20 MG tablet Take 1 tablet (20 mg total) by mouth daily. 01/13/20   Janith Lima, MD  traZODone (DESYREL) 50 MG tablet Take 1 tablet (50 mg total) by mouth at bedtime. 01/13/20   Janith Lima, MD    Physical Exam: Vitals:   05/16/20 1700 05/16/20 1730 05/16/20 1745 05/16/20 1841  BP: 129/72 116/68  118/71  Pulse: 62 (!) 59 62 62  Resp: 14  20 17   Temp:    98.8 F (37.1 C)  TempSrc:    Oral  SpO2: (!) 87%  91% 94%  Weight:      Height:       Physical Exam Vitals and nursing note reviewed.  Constitutional:      General: He is not in acute distress.    Appearance: Normal appearance. He is not ill-appearing, toxic-appearing or diaphoretic.  HENT:     Head: Normocephalic and atraumatic.     Right Ear: External ear normal.     Left Ear: External ear normal.     Nose: Nose normal.     Mouth/Throat:     Mouth: Mucous membranes are moist.  Eyes:     Extraocular Movements: Extraocular movements intact.     Pupils: Pupils are equal, round, and reactive to light.  Neck:     Vascular: No carotid bruit.  Cardiovascular:     Rate and Rhythm: Normal rate and regular rhythm.     Pulses: Normal  pulses.     Heart sounds: Normal heart sounds.  Pulmonary:     Effort: Pulmonary effort is normal.     Breath sounds: Normal breath sounds.  Abdominal:     General: Bowel sounds are normal.  There is no distension.     Palpations: Abdomen is soft.     Tenderness: There is no abdominal tenderness. There is no guarding.  Musculoskeletal:     Right lower leg: Edema present.     Left lower leg: Edema present.  Skin:    General: Skin is warm.  Neurological:     General: No focal deficit present.     Mental Status: He is alert and oriented to person, place, and time.  Psychiatric:        Mood and Affect: Mood normal.        Behavior: Behavior normal.      Labs on Admission: I have personally reviewed following labs and imaging studies No results for input(s): CKTOTAL, CKMB, TROPONINI in the last 72 hours. Lab Results  Component Value Date   WBC 7.3 05/16/2020   HGB 10.3 (L) 05/16/2020   HCT 31.7 (L) 05/16/2020   MCV 99.1 05/16/2020   PLT 152 05/16/2020    Recent Labs  Lab 05/16/20 1256  NA 134*  K 4.2  CL 101  CO2 27  BUN 12  CREATININE 0.55*  CALCIUM 7.9*  GLUCOSE 407*   Lab Results  Component Value Date   CHOL 123 03/16/2020   HDL 38.60 (L) 03/16/2020   LDLCALC 53 01/06/2019   TRIG 222.0 (H) 03/16/2020   No results found for: DDIMER Invalid input(s): POCBNP  Urinalysis    Component Value Date/Time   COLORURINE YELLOW 03/16/2020 [place] 03/16/2020 0958   LABSPEC 1.010 03/16/2020 0958   PHURINE 5.5 03/16/2020 0958   GLUCOSEU >=1000 (A) 03/16/2020 0958   HGBUR NEGATIVE 03/16/2020 0958   BILIRUBINUR NEGATIVE 03/16/2020 0958   KETONESUR 15 (A) 03/16/2020 0958   PROTEINUR 30 (A) 12/07/2019 0945   UROBILINOGEN 0.2 03/16/2020 0958   NITRITE NEGATIVE 03/16/2020 0958   LEUKOCYTESUR NEGATIVE 03/16/2020 0958    COVID-19 Labs No results for input(s): DDIMER, FERRITIN, LDH, CRP in the last 72 hours. Lab Results  Component Value Date    SARSCOV2NAA NEGATIVE 05/16/2020   [place] NEGATIVE 12/07/2019   [place] NEGATIVE 12/03/2019    Radiological Exams on Admission: DG Pelvis 1-2 Views  Result Date: 05/16/2020 CLINICAL DATA:  Left hip pain after fall today. EXAM: PELVIS - 1-2 VIEW COMPARISON:  None. FINDINGS: There is no evidence of pelvic fracture or diastasis. No pelvic bone lesions are seen. IMPRESSION: Negative. Electronically Signed   By: Marijo Conception M.D.   On: 05/16/2020 11:30   CT Head Wo Contrast  Result Date: 05/16/2020 CLINICAL DATA:  Fall down stairs EXAM: CT HEAD WITHOUT CONTRAST TECHNIQUE: Contiguous axial images were obtained from the base of the skull through the vertex without intravenous contrast. COMPARISON:  None. FINDINGS: Brain: No acute intracranial abnormality. Specifically, no hemorrhage, hydrocephalus, mass lesion, acute infarction, or significant intracranial injury. Vascular: No hyperdense vessel or unexpected calcification. Skull: No acute calvarial abnormality. Sinuses/Orbits: No acute findings Other: None IMPRESSION: No acute intracranial abnormality. Electronically Signed   By: Rolm Baptise M.D.   On: 05/16/2020 11:26   CT Chest Wo Contrast  Result Date: 05/16/2020 CLINICAL DATA:  Left chest and scapular pain after fall down stairs EXAM: CT CHEST WITHOUT CONTRAST TECHNIQUE: Multidetector CT imaging of the chest was performed following the standard protocol without IV contrast. COMPARISON:  Chest x-ray 12/07/2019 FINDINGS: Cardiovascular: Heart size is normal. No pericardial effusion. Thoracic aorta is nonaneurysmal. There are atherosclerotic calcifications of the aorta and coronary arteries. Patient is status  post CABG. Mediastinum/Nodes: No axillary, mediastinal, or hilar lymphadenopathy evident on non contrasted exam. Thyroid, trachea, and esophagus appear within normal limits. Lungs/Pleura: Moderate bilateral pleural effusions with associated compressive atelectasis. No pneumothorax. Upper  Abdomen: No acute abnormality. Musculoskeletal: Acute mildly comminuted and relatively nondisplaced fracture of the left scapular spine. Acute fracture of the superior border of the left coracoid process which is superiorly displaced by approximately 11 mm (series 6, image 168). Acromioclavicular and glenohumeral joint alignment appears maintained without dislocation. No acute rib fracture. Thoracic vertebral body heights and alignment are maintained without evidence of fracture. Soft tissue edema and small amount of hemorrhage at the fracture sites. No well-defined hematoma. IMPRESSION: 1. Acute fractures of the left scapula including a mildly comminuted and relatively nondisplaced fracture of the left scapular spine. Acute fracture of the superior border of the coracoid process which is superiorly displaced by approximately 11 mm. 2. Negative for rib fracture. 3. Moderate bilateral pleural effusions with associated compressive atelectasis. No pneumothorax. Aortic Atherosclerosis (ICD10-I70.0). Electronically Signed   By: Davina Poke D.O.   On: 05/16/2020 11:59   CT Cervical Spine Wo Contrast  Result Date: 05/16/2020 CLINICAL DATA:  Fall down stairs. EXAM: CT CERVICAL SPINE WITHOUT CONTRAST TECHNIQUE: Multidetector CT imaging of the cervical spine was performed without intravenous contrast. Multiplanar CT image reconstructions were also generated. COMPARISON:  None. FINDINGS: Alignment: Normal Skull base and vertebrae: No acute fracture. No primary bone lesion or focal pathologic process. Soft tissues and spinal canal: No prevertebral fluid or swelling. No visible canal hematoma. Disc levels: Degenerative disc disease at C5-6 and C6-7 with disc space narrowing and spurring. Bilateral degenerative facet disease, left greater than right. Upper chest: Large bilateral pleural effusions partially imaged. Other: None IMPRESSION: Cervical spondylosis.  No acute bony abnormality. Large bilateral pleural effusions.  Electronically Signed   By: Rolm Baptise M.D.   On: 05/16/2020 11:27   CT T-SPINE NO CHARGE  Result Date: 05/16/2020 CLINICAL DATA:  Fall down stairs. EXAM: CT THORACIC SPINE WITHOUT CONTRAST TECHNIQUE: Multidetector CT images of the thoracic were obtained using the standard protocol without intravenous contrast. COMPARISON:  None. FINDINGS: Alignment: Normal. Vertebrae: No definite acute fracture is identified. Curvilinear lucency in the left T3 transverse process appears partially corticated and is favored to reflect a vascular channel rather than nondisplaced fracture with less conspicuous suspected vascular channels and/or artifact seen in other transverse processes including on the right at T3. Paraspinal and other soft tissues: No acute abnormality identified in the paraspinal soft tissues. Intrathoracic contents reported separately. Disc levels: Mild thoracic spondylosis without evidence of a high-grade stenosis. IMPRESSION: No acute osseous abnormality identified in the thoracic spine. Electronically Signed   By: Logan Bores M.D.   On: 05/16/2020 11:40   DG Shoulder Left  Result Date: 05/16/2020 CLINICAL DATA:  Fall down stairs.  Left shoulder pain. EXAM: LEFT SHOULDER - 2+ VIEW COMPARISON:  None. FINDINGS: There is a fracture through the left scapula at the base of the acromion, minimal displacement. Degenerative changes in the [facility] joint. Visualized humerus unremarkable. No dislocation. IMPRESSION: Scapular fracture at the base of the left acromion. Electronically Signed   By: Rolm Baptise M.D.   On: 05/16/2020 11:25   DG Knee Complete 4 Views Left  Result Date: 05/16/2020 CLINICAL DATA:  Left knee swelling after fall today. EXAM: LEFT KNEE - COMPLETE 4+ VIEW COMPARISON:  None. FINDINGS: No evidence of fracture, dislocation, or joint effusion. No evidence of arthropathy or other focal bone abnormality.  Soft tissues are unremarkable. IMPRESSION: Negative. Electronically Signed   By: Marijo Conception M.D.   On: 05/16/2020 11:29   DG Humerus Left  Result Date: 05/16/2020 CLINICAL DATA:  Left shoulder pain after fall down stairs today. EXAM: LEFT HUMERUS - 2+ VIEW COMPARISON:  None. FINDINGS: Mildly displaced fracture is seen involving the left acromion. Visualized humerus is unremarkable. IMPRESSION: Mildly displaced left acromial fracture. Electronically Signed   By: Marijo Conception M.D.   On: 05/16/2020 11:28    EKG: Independently reviewed.  Atrial fibrillation at the rate of 68, QTC of 49.  Echocardiogram: None   Assessment/Plan Principal Problem:   Hypoxia Active Problems:   Uncontrolled type 2 diabetes mellitus with hyperglycemia (HCC)   Coronary artery disease   Hypertension   Anemia   PAF (paroxysmal atrial fibrillation) ([place])   Fall (on) (from) other stairs and steps, initial encounter   Closed left scapular fracture   Hypoxia: Supplemental oxygen. 2 d echo. PRN lasix if BP allows. No h/o chf we will obtain echo and le venous doppler.  DM II: SSI and glycemic protocol. Home regimen of insulin is held.  CAD: Pleural effusion/ le edema/ we will obtain 2 d echo and we will continue asa and hold eliquis.   HTN; Blood pressure 118/71, pulse 62, temperature 98.8 F (37.1 C), temperature source Oral, resp. rate 17, height 6\' 2"  (1.88 m), weight 79.4 kg, SpO2 94 %. bp is low  we will hold and monitor for orthostatic hypotension.    Anemia: Anemia panel/ stool card/ iv ppi.  PAF: We will hold eliquis if pt needs thoracentesis. Ct chest to quantify pleural effusion.   Fall: PT and fal/ aspiration precaution.  Closed left scapular fracture: Sling and prn pain meds.  Pleural effusion: Pt has pleural effusion with hypoxia and we will determine plan with repeat imaging if not thoracentesis then we will resume the eliquis tomorrow.  DVT prophylaxis:  SCDs  Code Status:  Full code  Family Communication:  NICHOLES, HIBLER (Son)  [phone]  [facility])  Disposition Plan:  To be determined  Consults called:  Orthopedic: Dr. Rodell Perna  Admission status: Inpatient   Para Skeans MD [facility] [phone] How to contact the [facility] Attending or Consulting provider [place] or covering provider during after hours [place], for this patient.    1. Check the care team in [facility] and look for a) attending/consulting [place] provider listed and b) the [facility] team listed 2. Log into [email or web address] and use [place]'s universal password to access. If you do not have the password, please contact the hospital operator. 3. Locate the [facility] provider you are looking for under [facility] and page to a number that you can be directly reached. 4. If you still have difficulty reaching the provider, please page the [facility] (Director on Call) for the Hospitalists listed on amion for assistance. [email or web address] Password [email or web address] 05/16/2020, 7:00 PM

## 2020-05-17 ENCOUNTER — Inpatient Hospital Stay (HOSPITAL_COMMUNITY): Payer: HMO

## 2020-05-17 DIAGNOSIS — S42102A Fracture of unspecified part of scapula, left shoulder, initial encounter for closed fracture: Secondary | ICD-10-CM

## 2020-05-17 DIAGNOSIS — R0602 Shortness of breath: Secondary | ICD-10-CM | POA: Diagnosis not present

## 2020-05-17 DIAGNOSIS — I480 Paroxysmal atrial fibrillation: Secondary | ICD-10-CM

## 2020-05-17 DIAGNOSIS — R609 Edema, unspecified: Secondary | ICD-10-CM | POA: Diagnosis not present

## 2020-05-17 DIAGNOSIS — E1165 Type 2 diabetes mellitus with hyperglycemia: Secondary | ICD-10-CM

## 2020-05-17 DIAGNOSIS — I10 Essential (primary) hypertension: Secondary | ICD-10-CM

## 2020-05-17 DIAGNOSIS — I2510 Atherosclerotic heart disease of native coronary artery without angina pectoris: Secondary | ICD-10-CM

## 2020-05-17 DIAGNOSIS — W108XXA Fall (on) (from) other stairs and steps, initial encounter: Secondary | ICD-10-CM

## 2020-05-17 DIAGNOSIS — S42132A Displaced fracture of coracoid process, left shoulder, initial encounter for closed fracture: Secondary | ICD-10-CM

## 2020-05-17 LAB — CBC
HCT: 29 % — ABNORMAL LOW (ref 39.0–52.0)
Hemoglobin: 9.4 g/dL — ABNORMAL LOW (ref 13.0–17.0)
MCH: 32.4 pg (ref 26.0–34.0)
MCHC: 32.4 g/dL (ref 30.0–36.0)
MCV: 100 fL (ref 80.0–100.0)
Platelets: 156 10*3/uL (ref 150–400)
RBC: 2.9 MIL/uL — ABNORMAL LOW (ref 4.22–5.81)
RDW: 13.1 % (ref 11.5–15.5)
WBC: 5.1 10*3/uL (ref 4.0–10.5)
nRBC: 0 % (ref 0.0–0.2)

## 2020-05-17 LAB — COMPREHENSIVE METABOLIC PANEL
ALT: 12 U/L (ref 0–44)
AST: 16 U/L (ref 15–41)
Albumin: 2.7 g/dL — ABNORMAL LOW (ref 3.5–5.0)
Alkaline Phosphatase: 45 U/L (ref 38–126)
Anion gap: 9 (ref 5–15)
BUN: 13 mg/dL (ref 8–23)
CO2: 25 mmol/L (ref 22–32)
Calcium: 8.4 mg/dL — ABNORMAL LOW (ref 8.9–10.3)
Chloride: 100 mmol/L (ref 98–111)
Creatinine, Ser: 0.71 mg/dL (ref 0.61–1.24)
GFR, Estimated: >60 mL/min (ref >60)
Glucose, Bld: 267 mg/dL — ABNORMAL HIGH (ref 70–99)
Potassium: 4.7 mmol/L (ref 3.5–5.1)
Sodium: 134 mmol/L — ABNORMAL LOW (ref 135–145)
Total Bilirubin: 1.1 mg/dL (ref 0.3–1.2)
Total Protein: 4.6 g/dL — ABNORMAL LOW (ref 6.5–8.1)

## 2020-05-17 LAB — GLUCOSE, CAPILLARY
Glucose-Capillary: 312 mg/dL — ABNORMAL HIGH (ref 70–99)
Glucose-Capillary: 240 mg/dL — ABNORMAL HIGH (ref 70–99)
Glucose-Capillary: 231 mg/dL — ABNORMAL HIGH (ref 70–99)
Glucose-Capillary: 189 mg/dL — ABNORMAL HIGH (ref 70–99)

## 2020-05-17 LAB — ECHOCARDIOGRAM COMPLETE BUBBLE STUDY: S' Lateral: 2.4 cm

## 2020-05-17 LAB — ABO/RH: ABO/RH(D): O POS

## 2020-05-17 MED ORDER — INSULIN ASPART 100 UNIT/ML IJ SOLN
5.0000 [IU] | Freq: Three times a day (TID) | INTRAMUSCULAR | Status: DC
  Administered 2020-05-17 – 2020-05-24 (×16): 5 [IU] via SUBCUTANEOUS

## 2020-05-17 MED ORDER — LIVING WELL WITH DIABETES BOOK
Freq: Once | Status: AC
  Filled 2020-05-17: qty 1

## 2020-05-17 MED ORDER — FUROSEMIDE 10 MG/ML IJ SOLN
40.0000 mg | Freq: Once | INTRAMUSCULAR | Status: AC
  Administered 2020-05-17: 40 mg via INTRAVENOUS
  Filled 2020-05-17: qty 4

## 2020-05-17 MED ORDER — INSULIN GLARGINE 100 UNIT/ML ~~LOC~~ SOLN
20.0000 [IU] | Freq: Every day | SUBCUTANEOUS | Status: DC
  Administered 2020-05-17 – 2020-05-24 (×8): 20 [IU] via SUBCUTANEOUS
  Filled 2020-05-17 (×8): qty 0.2

## 2020-05-17 NOTE — Progress Notes (Signed)
Inpatient Diabetes Program Recommendations  AACE/ADA: New Consensus Statement on Inpatient Glycemic Control (2015)  Target Ranges:  Prepandial:   less than 140 mg/dL      Peak postprandial:   less than 180 mg/dL (1-2 hours)      Critically ill patients:  140 - 180 mg/dL   Lab Results  Component Value Date   GLUCAP 312 (H) 05/17/2020   HGBA1C 12.7 (H) 05/16/2020    Review of Glycemic Control Results for [name] [name], [name] [name] (MRN [id number]) as of 05/17/2020 09:27  Ref. Range 05/16/2020 21:48 05/17/2020 08:55  Glucose-Capillary Latest Ref Range: 70 - 99 mg/dL 369 (H) 312 (H)   Diabetes history: DM2 Outpatient Diabetes medications: Lantus 50 units + Novolog 10-20 units tid meal coverage Current orders for Inpatient glycemic control: Novolog 0-15 units tid correction + 0-5 units hs  Inpatient Diabetes Program Recommendations:   -Add Lantus 25 units daily (50% home basal insulin dose) -Add Novolog 5 units tid meal coverage if eats 50% Secure chat sent to Dr. Bonner Puna.  A1c on 03/16/20 @ PCP office was 14.3 and currently 12.7 so has improved significantly over the past 2 months.  Thank you, Nani Gasser. Haig Gerardo, RN, MSN, CDE  Diabetes Coordinator Inpatient Glycemic Control Team Team Pager [phone] (8am-5pm) 05/17/2020 9:34 AM

## 2020-05-17 NOTE — Progress Notes (Signed)
PROGRESS NOTE  Tyvan Hockley  [id number] DOB: [birth year] DOA: 05/16/2020 PCP: Janith Lima, MD  Outpatient Specialists: Awaiting appointments to establish care with cardiology (Dr. Audie Box) and endocrinology, (Dr. Loanne Drilling)   Brief Narrative: Dionysus Vanderhoek is a 66 y.o. male with a history of atrial fibrillation on eliquis, uncontrolled IDT2DM, CAD s/p CABG x4 in 2003 who presented to the [facility] ED 5/16 after falling down approximately 17 stairs at home. After extensive radiological evaluation, he was diagnosed with left scapular fracture and coracoid fracture as well as newly discovered bilateral pleural effusions. He was admitted to [facility] after phone consultation with trauma service, and orthopedics (Dr. Lorin Mercy) has recommended nonoperative management. Work up is underway for bilateral pleural effusions. Eliquis is held due to acutely decreasing hemoglobin and effusion.   Assessment & Plan: Principal Problem:   Hypoxia Active Problems:   Uncontrolled type 2 diabetes mellitus with hyperglycemia (HCC)   Coronary artery disease   Hypertension   Anemia   PAF (paroxysmal atrial fibrillation) ([place])   Fall (on) (from) other stairs and steps, initial encounter   Closed left scapular fracture  Bilateral pleural effusions: Symptomatic with dyspnea, though not persistently hypoxic.  - Check echocardiogram (at risk for ICM due to CAD s/p CABG and continued poor glycemic control as well as due to NICM w/AFib) - Hold anticoagulation - Depending on work up, would consider diuresis and/or diagnostic and therapeutic thoracentesis   Left scapular and coracoid fractures:  - Sling recommended by Dr. Lorin Mercy who requests follow up in 2 weeks.  - PT consult pending.  CAD s/p 4v CABG 2003, HTN: No anginal symptoms.  - Continue statin, holding lisinopril, hold ASA/eliquis. - Has cardiology appointment scheduled 5/26 with Dr. Audie Box.   PAF: Currently in atrial fibrillation with controlled  ventricular rate. TSH wnl. - Hold eliquis - Not on rate control agents at this time  Lower extremity swelling:  - LE venous U/S - Echo as above  Acute anemia: Hgb down to 9.4 from prior putative baseline of 13. No external bleeding noted.  - Anemia panel, hold eliquis. On IV PPI   Poorly controlled IDT2DM with hyperglycemia: HbA1c 12.7% as pt reports stopping lantus due to nighttime low blood sugars.  - Restart lantus at modest dose (20u) to improve control, continue SSI  Moderate LLE PAD: Left ABI 0.88, right 1.09 earlier this month.  - Statin, anticoagulation after discharge.  Depression:  - Continue SSRI, trazodone, remeron  DVT prophylaxis: SCDs Code Status: Full Family Communication: None at bedside Disposition Plan:  Status is: Inpatient  Remains inpatient appropriate because:Ongoing diagnostic testing needed not appropriate for outpatient work up   Dispo: The patient is from: Home              Anticipated d/c is to: Home - I let PCP know patient would miss appointment with him tomorrow morning.              Patient currently is not medically stable to d/c.   Difficult to place patient No  Consultants:   None  Procedures:   None  Antimicrobials:  None   Subjective: Feels intermittently short of breath. No chest pain, Mild orthopnea, leg swelling bilaterally. Notes wiping red after constipated BMs but no other bleeding. Intermittent lightheadedness and palpitations also noted.   Objective: Vitals:   05/16/20 1700 05/16/20 1730 05/16/20 1745 05/16/20 1841  BP: 129/72 116/68  118/71  Pulse: 62 (!) 59 62 62  Resp: 14  20 17  Temp:    98.8 F (37.1 C)  TempSrc:    Oral  SpO2: (!) 87%  91% 94%  Weight:      Height:        Intake/Output Summary (Last 24 hours) at 05/17/2020 0908 Last data filed at 05/16/2020 2200 Gross per 24 hour  Intake 240 ml  Output --  Net 240 ml   Filed Weights   05/16/20 1003  Weight: 79.4 kg   Gen: 65 y.o. male in no  distress Pulm: Non-labored breathing room air, diminished bilaterally  CV: Irreg with rate in 70's. No murmur, rub, or gallop. No JVD, 1+ symmetric LE edema. GI: Abdomen soft, non-tender, non-distended, with normoactive bowel sounds. No organomegaly or masses felt. Ext: Warm. LUE with restricted ROM due to pain. Distally neurovascularly intact. Skin: No rashes, lesions or ulcers Neuro: Alert and oriented. No focal neurological deficits. Psych: Judgement and insight appear normal. Mood & affect appropriate.   Data Reviewed: I have personally reviewed following labs and imaging studies  CBC: Recent Labs  Lab 05/16/20 1256 05/17/20 0457  WBC 7.3 5.1  HGB 10.3* 9.4*  HCT 31.7* 29.0*  MCV 99.1 100.0  PLT 152 814   Basic Metabolic Panel: Recent Labs  Lab 05/16/20 1256 05/17/20 0457  NA 134* 134*  K 4.2 4.7  CL 101 100  CO2 27 25  GLUCOSE 407* 267*  BUN 12 13  CREATININE 0.55* 0.71  CALCIUM 7.9* 8.4*   GFR: Estimated Creatinine Clearance: 100.6 mL/min (by C-G formula based on SCr of 0.71 mg/dL). Liver Function Tests: Recent Labs  Lab 05/17/20 0457  AST 16  ALT 12  ALKPHOS 45  BILITOT 1.1  PROT 4.6*  ALBUMIN 2.7*   No results for input(s): LIPASE, AMYLASE in the last 168 hours. No results for input(s): AMMONIA in the last 168 hours. Coagulation Profile: No results for input(s): INR, PROTIME in the last 168 hours. Cardiac Enzymes: No results for input(s): CKTOTAL, CKMB, CKMBINDEX, TROPONINI in the last 168 hours. BNP (last 3 results) No results for input(s): PROBNP in the last 8760 hours. HbA1C: Recent Labs    05/16/20 2028  HGBA1C 12.7*   CBG: Recent Labs  Lab 05/16/20 2148 05/17/20 0855  GLUCAP 369* 312*   Lipid Profile: No results for input(s): CHOL, HDL, LDLCALC, TRIG, CHOLHDL, LDLDIRECT in the last 72 hours. Thyroid Function Tests: Recent Labs    05/16/20 2028  TSH 2.845  FREET4 1.23*   Anemia Panel: Recent Labs    05/16/20 2028   VITAMINB12 337  FOLATE 26.0  FERRITIN 418*  TIBC 273  IRON 33*  RETICCTPCT 2.8   Urine analysis:    Component Value Date/Time   COLORURINE YELLOW 03/16/2020 0958   APPEARANCEUR CLEAR 03/16/2020 0958   LABSPEC 1.010 03/16/2020 0958   PHURINE 5.5 03/16/2020 0958   GLUCOSEU >=1000 (A) 03/16/2020 0958   HGBUR NEGATIVE 03/16/2020 0958   BILIRUBINUR NEGATIVE 03/16/2020 0958   KETONESUR 15 (A) 03/16/2020 0958   PROTEINUR 30 (A) 12/07/2019 0945   UROBILINOGEN 0.2 03/16/2020 0958   NITRITE NEGATIVE 03/16/2020 0958   LEUKOCYTESUR NEGATIVE 03/16/2020 0958   Recent Results (from the past 240 hour(s))  Resp Panel by RT-PCR (Flu A&B, Covid) Nasopharyngeal Swab     Status: None   Collection Time: 05/16/20  1:06 PM   Specimen: Nasopharyngeal Swab; Nasopharyngeal(NP) swabs in vial transport medium  Result Value Ref Range Status   SARS Coronavirus 2 by RT PCR NEGATIVE NEGATIVE Final    Comment: (  NOTE) SARS-CoV-2 target nucleic acids are NOT DETECTED.  The SARS-CoV-2 RNA is generally detectable in upper respiratory specimens during the acute phase of infection. The lowest concentration of SARS-CoV-2 viral copies this assay can detect is 138 copies/mL. A negative result does not preclude SARS-Cov-2 infection and should not be used as the sole basis for treatment or other patient management decisions. A negative result may occur with  improper specimen collection/handling, submission of specimen other than nasopharyngeal swab, presence of viral mutation(s) within the areas targeted by this assay, and inadequate number of viral copies(<138 copies/mL). A negative result must be combined with clinical observations, patient history, and epidemiological information. The expected result is Negative.  Fact Sheet for Patients:  EntrepreneurPulse.com.au  Fact Sheet for Healthcare Providers:  IncredibleEmployment.be  This test is no t yet approved or cleared  by the [place] FDA and  has been authorized for detection and/or diagnosis of SARS-CoV-2 by FDA under an Emergency Use Authorization (EUA). This EUA will remain  in effect (meaning this test can be used) for the duration of the COVID-19 declaration under Section 564(b)(1) of the Act, 21 U.S.C.section 360bbb-3(b)(1), unless the authorization is terminated  or revoked sooner.       Influenza A by PCR NEGATIVE NEGATIVE Final   Influenza B by PCR NEGATIVE NEGATIVE Final    Comment: (NOTE) The Xpert Xpress SARS-CoV-2/FLU/RSV plus assay is intended as an aid in the diagnosis of influenza from Nasopharyngeal swab specimens and should not be used as a sole basis for treatment. Nasal washings and aspirates are unacceptable for Xpert Xpress SARS-CoV-2/FLU/RSV testing.  Fact Sheet for Patients: EntrepreneurPulse.com.au  Fact Sheet for Healthcare Providers: IncredibleEmployment.be  This test is not yet approved or cleared by the [place] FDA and has been authorized for detection and/or diagnosis of SARS-CoV-2 by FDA under an Emergency Use Authorization (EUA). This EUA will remain in effect (meaning this test can be used) for the duration of the COVID-19 declaration under Section 564(b)(1) of the Act, 21 U.S.C. section 360bbb-3(b)(1), unless the authorization is terminated or revoked.  Performed at [facility], [address], [place], [place] [address]       Radiology Studies: DG Pelvis 1-2 Views  Result Date: 05/16/2020 CLINICAL DATA:  Left hip pain after fall today. EXAM: PELVIS - 1-2 VIEW COMPARISON:  None. FINDINGS: There is no evidence of pelvic fracture or diastasis. No pelvic bone lesions are seen. IMPRESSION: Negative. Electronically Signed   By: Marijo Conception M.D.   On: 05/16/2020 11:30   CT Head Wo Contrast  Result Date: 05/16/2020 CLINICAL DATA:  Fall down stairs EXAM: CT HEAD WITHOUT CONTRAST TECHNIQUE:  Contiguous axial images were obtained from the base of the skull through the vertex without intravenous contrast. COMPARISON:  None. FINDINGS: Brain: No acute intracranial abnormality. Specifically, no hemorrhage, hydrocephalus, mass lesion, acute infarction, or significant intracranial injury. Vascular: No hyperdense vessel or unexpected calcification. Skull: No acute calvarial abnormality. Sinuses/Orbits: No acute findings Other: None IMPRESSION: No acute intracranial abnormality. Electronically Signed   By: Rolm Baptise M.D.   On: 05/16/2020 11:26   CT Chest Wo Contrast  Result Date: 05/16/2020 CLINICAL DATA:  Left chest and scapular pain after fall down stairs EXAM: CT CHEST WITHOUT CONTRAST TECHNIQUE: Multidetector CT imaging of the chest was performed following the standard protocol without IV contrast. COMPARISON:  Chest x-ray 12/07/2019 FINDINGS: Cardiovascular: Heart size is normal. No pericardial effusion. Thoracic aorta is nonaneurysmal. There are atherosclerotic calcifications of the aorta  and coronary arteries. Patient is status post CABG. Mediastinum/Nodes: No axillary, mediastinal, or hilar lymphadenopathy evident on non contrasted exam. Thyroid, trachea, and esophagus appear within normal limits. Lungs/Pleura: Moderate bilateral pleural effusions with associated compressive atelectasis. No pneumothorax. Upper Abdomen: No acute abnormality. Musculoskeletal: Acute mildly comminuted and relatively nondisplaced fracture of the left scapular spine. Acute fracture of the superior border of the left coracoid process which is superiorly displaced by approximately 11 mm (series 6, image 168). Acromioclavicular and glenohumeral joint alignment appears maintained without dislocation. No acute rib fracture. Thoracic vertebral body heights and alignment are maintained without evidence of fracture. Soft tissue edema and small amount of hemorrhage at the fracture sites. No well-defined hematoma. IMPRESSION: 1.  Acute fractures of the left scapula including a mildly comminuted and relatively nondisplaced fracture of the left scapular spine. Acute fracture of the superior border of the coracoid process which is superiorly displaced by approximately 11 mm. 2. Negative for rib fracture. 3. Moderate bilateral pleural effusions with associated compressive atelectasis. No pneumothorax. Aortic Atherosclerosis (ICD10-I70.0). Electronically Signed   By: Davina Poke D.O.   On: 05/16/2020 11:59   CT Cervical Spine Wo Contrast  Result Date: 05/16/2020 CLINICAL DATA:  Fall down stairs. EXAM: CT CERVICAL SPINE WITHOUT CONTRAST TECHNIQUE: Multidetector CT imaging of the cervical spine was performed without intravenous contrast. Multiplanar CT image reconstructions were also generated. COMPARISON:  None. FINDINGS: Alignment: Normal Skull base and vertebrae: No acute fracture. No primary bone lesion or focal pathologic process. Soft tissues and spinal canal: No prevertebral fluid or swelling. No visible canal hematoma. Disc levels: Degenerative disc disease at C5-6 and C6-7 with disc space narrowing and spurring. Bilateral degenerative facet disease, left greater than right. Upper chest: Large bilateral pleural effusions partially imaged. Other: None IMPRESSION: Cervical spondylosis.  No acute bony abnormality. Large bilateral pleural effusions. Electronically Signed   By: Rolm Baptise M.D.   On: 05/16/2020 11:27   CT T-SPINE NO CHARGE  Result Date: 05/16/2020 CLINICAL DATA:  Fall down stairs. EXAM: CT THORACIC SPINE WITHOUT CONTRAST TECHNIQUE: Multidetector CT images of the thoracic were obtained using the standard protocol without intravenous contrast. COMPARISON:  None. FINDINGS: Alignment: Normal. Vertebrae: No definite acute fracture is identified. Curvilinear lucency in the left T3 transverse process appears partially corticated and is favored to reflect a vascular channel rather than nondisplaced fracture with less  conspicuous suspected vascular channels and/or artifact seen in other transverse processes including on the right at T3. Paraspinal and other soft tissues: No acute abnormality identified in the paraspinal soft tissues. Intrathoracic contents reported separately. Disc levels: Mild thoracic spondylosis without evidence of a high-grade stenosis. IMPRESSION: No acute osseous abnormality identified in the thoracic spine. Electronically Signed   By: Logan Bores M.D.   On: 05/16/2020 11:40   DG Shoulder Left  Result Date: 05/16/2020 CLINICAL DATA:  Fall down stairs.  Left shoulder pain. EXAM: LEFT SHOULDER - 2+ VIEW COMPARISON:  None. FINDINGS: There is a fracture through the left scapula at the base of the acromion, minimal displacement. Degenerative changes in the [facility] joint. Visualized humerus unremarkable. No dislocation. IMPRESSION: Scapular fracture at the base of the left acromion. Electronically Signed   By: Rolm Baptise M.D.   On: 05/16/2020 11:25   DG Knee Complete 4 Views Left  Result Date: 05/16/2020 CLINICAL DATA:  Left knee swelling after fall today. EXAM: LEFT KNEE - COMPLETE 4+ VIEW COMPARISON:  None. FINDINGS: No evidence of fracture, dislocation, or joint effusion. No evidence of  arthropathy or other focal bone abnormality. Soft tissues are unremarkable. IMPRESSION: Negative. Electronically Signed   By: Marijo Conception M.D.   On: 05/16/2020 11:29   DG Humerus Left  Result Date: 05/16/2020 CLINICAL DATA:  Left shoulder pain after fall down stairs today. EXAM: LEFT HUMERUS - 2+ VIEW COMPARISON:  None. FINDINGS: Mildly displaced fracture is seen involving the left acromion. Visualized humerus is unremarkable. IMPRESSION: Mildly displaced left acromial fracture. Electronically Signed   By: Marijo Conception M.D.   On: 05/16/2020 11:28    Scheduled Meds: . atorvastatin  40 mg Oral Daily  . insulin aspart  0-15 Units Subcutaneous TID WC  . insulin aspart  0-5 Units Subcutaneous QHS  .  mirtazapine  15 mg Oral QHS  . pantoprazole (PROTONIX) IV  40 mg Intravenous Q24H  . PARoxetine  20 mg Oral Daily  . sodium chloride flush  3 mL Intravenous Q12H  . traZODone  25 mg Oral QHS   Continuous Infusions: . sodium chloride       LOS: 1 day   Time spent: 35 minutes.  Patrecia Pour, MD [facility] [email or web address] 05/17/2020, 9:08 AM

## 2020-05-17 NOTE — Progress Notes (Signed)
Lower extremity venous bilateral study completed.   Please see CV Proc for preliminary results.   Jadira Nierman, RDMS, RVT  

## 2020-05-17 NOTE — Evaluation (Signed)
Physical Therapy Evaluation Patient Details Name: [name] [name] MRN: [id number] DOB: 07-06-1955 Today's Date: 05/17/2020   History of Present Illness  [name] [name] is a 65 y.o. male seen for pleural effusion with hypoxia, as well as a fall resulting in L closed scapular fx, to be managed with a sling, per Ortho;  has a past medical history of Anxiety, Aortic dissection (HCC), Arthritis, Constipation, Coronary artery disease, Depression, Diabetes mellitus without complication ([place]), DKA (diabetic ketoacidoses), Hyperlipidemia, Hypertension, and Psoriasis.   Clinical Impression  Pt admitted with above diagnosis. Lives at home with adult son, who helps him out with medications; Home is a two story house; pt has stayed on main level for a few years due to knee pain making stairs difficult (1/2 bth downstairs); Typically at least  houshold ambulator without the need for an assistive device; Presents to PT with bil knee pain and L shoulder pain making bed mobility and transfers very painful, decr functional mobility, decr ADLs; Ordered OT per protocol for addressing ADLs;  Pt currently with functional limitations due to the deficits listed below (see PT Problem List). Pt will benefit from skilled PT to increase their independence and safety with mobility to allow discharge to the venue listed below.    Lots of difficulty moving today; He will need to make considerable gains in mobility and ADLs to be able to dc home; He indicated he would very much prefer going home to going to SNF for post-acute rehab; If he does not progress well, will need to consider     Follow Up Recommendations Home health PT;Other (comment) (if slow progress, we must consider SNF)    Equipment Recommendations  Cane;Wheelchair (measurements PT)    Recommendations for Other Services OT consult (ordered per protocol)     Precautions / Restrictions Precautions Precautions: Fall Required Braces or Orthoses: Sling  (Sling LUE ordered, however pt very anxious about the sling being painful, and refused to try it) Restrictions Other Position/Activity Restrictions: No weight bearing status in order set; Will proceed as NWB LUE unless otherwise ordered      Mobility  Bed Mobility Overal bed mobility: Needs Assistance Bed Mobility: Supine to Sit;Sit to Supine     Supine to sit: Mod assist Sit to supine: Min assist   General bed mobility comments: Opted to get up on R side in the hopes that it would elicit less pain; Mod assist with support given at R side to elevate trunk to sit; min assist to help LEs into bed    Transfers Overall transfer level: Needs assistance Equipment used: 2 person hand held assist Transfers: Sit to/from Stand Sit to Stand: Mod assist         General transfer comment: Heavy mod assist to power up, with assistance given at gait belt and RUE; elevated bed  Ambulation/Gait Ambulation/Gait assistance: Min assist;+2 physical assistance Gait Distance (Feet):  (sidesteps toward [facility]) Assistive device: 2 person hand held assist Gait Pattern/deviations: Patent attorney    Modified Rankin (Stroke Patients Only)       Balance Overall balance assessment: Needs assistance   Sitting balance-Leahy Scale: Poor Sitting balance - Comments: Tended to drift into posterior lean; gave downward pressure at knees as a coutner balance, and to encourage anterior weight shift Postural control: Posterior lean   Standing balance-Leahy Scale: Poor  Pertinent Vitals/Pain Pain Assessment: 0-10 Pain Score: 9  Pain Location: L shoulder, both anterior and posterior aspects, with any movement Pain Descriptors / Indicators: Aching;Grimacing;Guarding Pain Intervention(s): Monitored during session;RN gave pain meds during session    Home Living Family/patient expects to be discharged to:: Private  residence Living Arrangements: Children;Other (Comment) (Tells me his adult son lives with him) Available Help at Discharge: Family;Available PRN/intermittently;Other (Comment) (tells me his son assists him with managing meds) Type of Home: House Home Access: Stairs to enter Entrance Stairs-Rails: Right;Left Entrance Stairs-Number of Steps: 4-5 Home Layout: Two level;Able to live on main level with bedroom/bathroom;1/2 bath on main level;Other (Comment) (Has not gone upstairs in 3 years due to knee pain) Home Equipment: Cane - single point      Prior Function Level of Independence: Needs assistance   Gait / Transfers Assistance Needed: Typically without an assistive device; household ambulator; R knee pain has precluded stairs for 3 years  ADL's / Homemaking Assistance Needed: Has not accessed full bathroom in 3 years; sponge bathes on main level of house  Comments: Reports his son organizes his pills/meds     Hand Dominance   Dominant Hand: Right    Extremity/Trunk Assessment   Upper Extremity Assessment Upper Extremity Assessment: Defer to OT evaluation;RUE deficits/detail;LUE deficits/detail LUE Deficits / Details: Significant shoulder pain with any motion LUE; Hesitates to flex/extend elbow; noted he wore a gardening-type glove L hand, stating taht his hands are often "very cold"; Declined trying the sling when offered LUE: Unable to fully assess due to pain    Lower Extremity Assessment Lower Extremity Assessment: Generalized weakness;RLE deficits/detail;LLE deficits/detail RLE Deficits / Details: Chronic knee pain which has limited pt's ability to go up and down stairs for the past 3 years; decr strength, with need for assist to push up to fully standing LLE Deficits / Details: L knee had been his "good knee" until the fall leading to admission       Communication   Communication: No difficulties  Cognition Arousal/Alertness: Awake/alert Behavior During Therapy: Flat  affect Overall Cognitive Status: No family/caregiver present to determine baseline cognitive functioning                                 General Comments: Not fully forthcoming with information, but doesn't seem to get frustrated with detailed questions      General Comments General comments (skin integrity, edema, etc.): O2 sats 90-92% on room air    Exercises     Assessment/Plan    PT Assessment Patient needs continued PT services  PT Problem List Decreased strength;Decreased range of motion;Decreased activity tolerance;Decreased balance;Decreased mobility;Decreased coordination;Decreased cognition;Decreased knowledge of use of DME;Decreased safety awareness;Decreased knowledge of precautions       PT Treatment Interventions DME instruction;Gait training;Stair training;Functional mobility training;Therapeutic activities;Therapeutic exercise;Balance training;Patient/family education    PT Goals (Current goals can be found in the Care Plan section)  Acute Rehab PT Goals Patient Stated Goal: wants to get hone PT Goal Formulation: With patient Time For Goal Achievement: 05/31/20 Potential to Achieve Goals: Good    Frequency Min 3X/week   Barriers to discharge Decreased caregiver support Will need to be modified independent to be able to go home    Co-evaluation               AM-PAC PT "6 Clicks" Mobility  Outcome Measure Help needed turning from your back to your side while in  a flat bed without using bedrails?: A Little Help needed moving from lying on your back to sitting on the side of a flat bed without using bedrails?: A Little Help needed moving to and from a bed to a chair (including a wheelchair)?: A Lot Help needed standing up from a chair using your arms (e.g., wheelchair or bedside chair)?: A Lot Help needed to walk in hospital room?: A Lot Help needed climbing 3-5 steps with a railing? : A Lot 6 Click Score: 14    End of Session Equipment  Utilized During Treatment: Gait belt;Other (comment) (declined sling) Activity Tolerance: Patient limited by pain;Patient limited by fatigue Patient left: in bed;with call bell/phone within reach;with bed alarm set Nurse Communication: Mobility status PT Visit Diagnosis: Unsteadiness on feet (R26.81);Other abnormalities of gait and mobility (R26.89);History of falling (Z91.81);Pain Pain - Right/Left: Left Pain - part of body: Shoulder    Time: 2694-8546 PT Time Calculation (min) (ACUTE ONLY): 40 min   Charges:   PT Evaluation $PT Eval Moderate Complexity: 1 Mod PT Treatments $Therapeutic Activity: 23-37 mins        Roney Marion, PT  Acute Rehabilitation Services Pager [phone] Office [phone]   [place] 05/17/2020, 4:11 PM

## 2020-05-17 NOTE — Consult Note (Signed)
Reason for Consult: Left closed scapular fracture Referring Physician: Bonner Puna MD  [name] [name] is an 65 y.o. male.  HPI: 65 year old male with atrial fibrillation on chronic Eliquis also diabetes on insulin fell down the stairs at his house approximately 17 steps.  Patient was seen at [facility] emergency room where CT scan showed pleural effusion as well as left scapular fracture.  He denies numbness or tingling in his hand he states he has problems with his hands being cold at night and wear gloves at night when he sleeps due to hand coldness.  He denies any change in sensation the upper extremity since the fall and scapular fracture.  Patient's had previous harvesting of the radial artery for problems with aortic dissection with healed volar scar from wrist to elbow.  Past problems with depression, coronary artery disease, aortic dissection requiring surgery and repair, hypertension.  Past Medical History:  Diagnosis Date  . Anxiety   . Aortic dissection ([place])   . Arthritis   . Constipation    pt states goes EOD- hard stools   . Coronary artery disease   . Depression   . Diabetes mellitus without complication ([place])   . DKA (diabetic ketoacidoses)   . Hyperlipidemia   . Hypertension    off meds  . Psoriasis     Past Surgical History:  Procedure Laterality Date  . ANKLE SURGERY     right  . CARDIAC CATHETERIZATION  09/25/2001  . COLONOSCOPY     ~10 yr ago- normal  per pt   . CORONARY ARTERY BYPASS GRAFT  09/30/2001   CABG x 4  . LAPAROSCOPIC INGUINAL HERNIA REPAIR Bilateral 02/09/2010  . MASS EXCISION Left 03/12/2013   Procedure: LEFT INDEX EXCISION MASS AND DEBRIDMENT DISTAL INTERPHALANGEAL JOINT;  Surgeon: Tennis Must, MD;  Location: [facility];  Service: Orthopedics;  Laterality: Left;    Family History  Problem Relation Age of Onset  . Lymphoma Father   . Cancer Paternal Grandmother        Colon Cancer  . Colon cancer Paternal Grandmother   .  Colon polyps Neg Hx   . Esophageal cancer Neg Hx   . Rectal cancer Neg Hx   . Stomach cancer Neg Hx     Social History:  reports that he has been smoking cigars. He has never used smokeless tobacco. He reports current alcohol use. He reports that he does not use drugs.  Allergies: No Known Allergies  Medications: I have reviewed the patient's current medications.  Results for orders placed or performed during the hospital encounter of 05/16/20 (from the past 48 hour(s))  Basic metabolic panel     Status: Abnormal   Collection Time: 05/16/20 12:56 PM  Result Value Ref Range   Sodium 134 (L) 135 - 145 mmol/L   Potassium 4.2 3.5 - 5.1 mmol/L   Chloride 101 98 - 111 mmol/L   CO2 27 22 - 32 mmol/L   Glucose, Bld 407 (H) 70 - 99 mg/dL    Comment: Glucose reference range applies only to samples taken after fasting for at least 8 hours.   BUN 12 8 - 23 mg/dL   Creatinine, Ser 0.55 (L) 0.61 - 1.24 mg/dL   Calcium 7.9 (L) 8.9 - 10.3 mg/dL   GFR, Estimated >60 >60 mL/min    Comment: (NOTE) Calculated using the CKD-EPI Creatinine Equation (2021)    Anion gap 6 5 - 15    Comment: Performed at [facility], [place], [place] [address]  CBC     Status: Abnormal   Collection Time: 05/16/20 12:56 PM  Result Value Ref Range   WBC 7.3 4.0 - 10.5 K/uL   RBC 3.20 (L) 4.22 - 5.81 MIL/uL   Hemoglobin 10.3 (L) 13.0 - 17.0 g/dL   HCT 31.7 (L) 39.0 - 52.0 %   MCV 99.1 80.0 - 100.0 fL   MCH 32.2 26.0 - 34.0 pg   MCHC 32.5 30.0 - 36.0 g/dL   RDW 13.2 11.5 - 15.5 %   Platelets 152 150 - 400 K/uL   nRBC 0.0 0.0 - 0.2 %    Comment: Performed at [facility], [address] [place], [place] [address]  Resp Panel by RT-PCR (Flu A&B, Covid) Nasopharyngeal Swab     Status: None   Collection Time: 05/16/20  1:06 PM   Specimen: Nasopharyngeal Swab; Nasopharyngeal(NP) swabs in vial transport medium  Result Value Ref Range   SARS Coronavirus 2 by RT  PCR NEGATIVE NEGATIVE    Comment: (NOTE) SARS-CoV-2 target nucleic acids are NOT DETECTED.  The SARS-CoV-2 RNA is generally detectable in upper respiratory specimens during the acute phase of infection. The lowest concentration of SARS-CoV-2 viral copies this assay can detect is 138 copies/mL. A negative result does not preclude SARS-Cov-2 infection and should not be used as the sole basis for treatment or other patient management decisions. A negative result may occur with  improper specimen collection/handling, submission of specimen other than nasopharyngeal swab, presence of viral mutation(s) within the areas targeted by this assay, and inadequate number of viral copies(<138 copies/mL). A negative result must be combined with clinical observations, patient history, and epidemiological information. The expected result is Negative.  Fact Sheet for Patients:  EntrepreneurPulse.com.au  Fact Sheet for Healthcare Providers:  IncredibleEmployment.be  This test is no t yet approved or cleared by the [place] FDA and  has been authorized for detection and/or diagnosis of SARS-CoV-2 by FDA under an Emergency Use Authorization (EUA). This EUA will remain  in effect (meaning this test can be used) for the duration of the COVID-19 declaration under Section 564(b)(1) of the Act, 21 U.S.C.section 360bbb-3(b)(1), unless the authorization is terminated  or revoked sooner.       Influenza A by PCR NEGATIVE NEGATIVE   Influenza B by PCR NEGATIVE NEGATIVE    Comment: (NOTE) The Xpert Xpress SARS-CoV-2/FLU/RSV plus assay is intended as an aid in the diagnosis of influenza from Nasopharyngeal swab specimens and should not be used as a sole basis for treatment. Nasal washings and aspirates are unacceptable for Xpert Xpress SARS-CoV-2/FLU/RSV testing.  Fact Sheet for Patients: EntrepreneurPulse.com.au  Fact Sheet for Healthcare  Providers: IncredibleEmployment.be  This test is not yet approved or cleared by the [place] FDA and has been authorized for detection and/or diagnosis of SARS-CoV-2 by FDA under an Emergency Use Authorization (EUA). This EUA will remain in effect (meaning this test can be used) for the duration of the COVID-19 declaration under Section 564(b)(1) of the Act, 21 U.S.C. section 360bbb-3(b)(1), unless the authorization is terminated or revoked.  Performed at [facility], [address] [place], [place] [address]   HIV Antibody (routine testing w rflx)     Status: None   Collection Time: 05/16/20  8:28 PM  Result Value Ref Range   HIV Screen 4th Generation wRfx Non Reactive Non Reactive    Comment: Performed at [facility], [address],  [place] [address]  Hemoglobin A1c     Status: Abnormal   Collection Time: 05/16/20  8:28 PM  Result Value Ref Range   Hgb A1c MFr Bld 12.7 (H) 4.8 - 5.6 %    Comment: (NOTE) Pre diabetes:          5.7%-6.4%  Diabetes:              >6.4%  Glycemic control for   <7.0% adults with diabetes    Mean Plasma Glucose 317.79 mg/dL    Comment: Performed at [facility] [address], [place], [place] [address]  Vitamin B12     Status: None   Collection Time: 05/16/20  8:28 PM  Result Value Ref Range   Vitamin B-12 337 180 - 914 pg/mL    Comment: (NOTE) This assay is not validated for testing neonatal or myeloproliferative syndrome specimens for Vitamin B12 levels. Performed at [facility], [place] [address] [place], [place] [address]   Folate     Status: None   Collection Time: 05/16/20  8:28 PM  Result Value Ref Range   Folate 26.0 >5.9 ng/mL    Comment: Performed at [facility], [place] [address], [place], [place] [address]  Iron and TIBC     Status: Abnormal   Collection Time: 05/16/20  8:28 PM  Result Value Ref Range   Iron 33 (L) 45 - 182 ug/dL   TIBC 273 250 -  450 ug/dL   Saturation Ratios 12 (L) 17.9 - 39.5 %   UIBC 240 ug/dL    Comment: Performed at [facility] [address], [place], [place] [address]  Ferritin     Status: Abnormal   Collection Time: 05/16/20  8:28 PM  Result Value Ref Range   Ferritin 418 (H) 24 - 336 ng/mL    Comment: Performed at [facility], [place] [address] [place], [place] [address]  Reticulocytes     Status: Abnormal   Collection Time: 05/16/20  8:28 PM  Result Value Ref Range   Retic Ct Pct 2.8 0.4 - 3.1 %   RBC. 2.97 (L) 4.22 - 5.81 MIL/uL   Retic Count, Absolute 82.0 19.0 - 186.0 K/uL   Immature Retic Fract 12.7 2.3 - 15.9 %    Comment: Performed at [facility] [address] [place], [place] [address]  TSH     Status: None   Collection Time: 05/16/20  8:28 PM  Result Value Ref Range   TSH 2.845 0.350 - 4.500 uIU/mL    Comment: Performed by a 3rd Generation assay with a functional sensitivity of <=0.01 uIU/mL. Performed at [facility], [place] [address] [place], [place] [address]   T4, free     Status: Abnormal   Collection Time: 05/16/20  8:28 PM  Result Value Ref Range   Free T4 1.23 (H) 0.61 - 1.12 ng/dL    Comment: (NOTE) Biotin ingestion may interfere with free T4 tests. If the results are inconsistent with the TSH level, previous test results, or the clinical presentation, then consider biotin interference. If needed, order repeat testing after stopping biotin. Performed at [facility], [place] [address] [place], [place] [address]   Type and screen     Status: None   Collection Time: 05/16/20  8:28 PM  Result Value Ref Range   ABO/RH(D) O POS    Antibody Screen NEG    Sample Expiration  05/19/2020,2359 Performed at [facility] [address], [place], [place] [address]   Glucose, capillary     Status: Abnormal   Collection Time: 05/16/20  9:48 PM  Result Value Ref Range   Glucose-Capillary 369 (H) 70 - 99 mg/dL    Comment: Glucose reference  range applies only to samples taken after fasting for at least 8 hours.  Comprehensive metabolic panel     Status: Abnormal   Collection Time: 05/17/20  4:57 AM  Result Value Ref Range   Sodium 134 (L) 135 - 145 mmol/L   Potassium 4.7 3.5 - 5.1 mmol/L   Chloride 100 98 - 111 mmol/L   CO2 25 22 - 32 mmol/L   Glucose, Bld 267 (H) 70 - 99 mg/dL    Comment: Glucose reference range applies only to samples taken after fasting for at least 8 hours.   BUN 13 8 - 23 mg/dL   Creatinine, Ser 0.71 0.61 - 1.24 mg/dL   Calcium 8.4 (L) 8.9 - 10.3 mg/dL   Total Protein 4.6 (L) 6.5 - 8.1 g/dL   Albumin 2.7 (L) 3.5 - 5.0 g/dL   AST 16 15 - 41 U/L   ALT 12 0 - 44 U/L   Alkaline Phosphatase 45 38 - 126 U/L   Total Bilirubin 1.1 0.3 - 1.2 mg/dL   GFR, Estimated >60 >60 mL/min    Comment: (NOTE) Calculated using the CKD-EPI Creatinine Equation (2021)    Anion gap 9 5 - 15    Comment: Performed at [facility], [place] [address] [place], [place] [address]  CBC     Status: Abnormal   Collection Time: 05/17/20  4:57 AM  Result Value Ref Range   WBC 5.1 4.0 - 10.5 K/uL   RBC 2.90 (L) 4.22 - 5.81 MIL/uL   Hemoglobin 9.4 (L) 13.0 - 17.0 g/dL   HCT 29.0 (L) 39.0 - 52.0 %   MCV 100.0 80.0 - 100.0 fL   MCH 32.4 26.0 - 34.0 pg   MCHC 32.4 30.0 - 36.0 g/dL   RDW 13.1 11.5 - 15.5 %   Platelets 156 150 - 400 K/uL   nRBC 0.0 0.0 - 0.2 %    Comment: Performed at [facility], [place] [address], [place], [place] [address]  Glucose, capillary     Status: Abnormal   Collection Time: 05/17/20  8:55 AM  Result Value Ref Range   Glucose-Capillary 312 (H) 70 - 99 mg/dL    Comment: Glucose reference range applies only to samples taken after fasting for at least 8 hours.    DG Pelvis 1-2 Views  Result Date: 05/16/2020 CLINICAL DATA:  Left hip pain after fall today. EXAM: PELVIS - 1-2 VIEW COMPARISON:  None. FINDINGS: There is no evidence of pelvic fracture or diastasis. No pelvic bone lesions are seen.  IMPRESSION: Negative. Electronically Signed   By: Marijo Conception M.D.   On: 05/16/2020 11:30   CT Head Wo Contrast  Result Date: 05/16/2020 CLINICAL DATA:  Fall down stairs EXAM: CT HEAD WITHOUT CONTRAST TECHNIQUE: Contiguous axial images were obtained from the base of the skull through the vertex without intravenous contrast. COMPARISON:  None. FINDINGS: Brain: No acute intracranial abnormality. Specifically, no hemorrhage, hydrocephalus, mass lesion, acute infarction, or significant intracranial injury. Vascular: No hyperdense vessel or unexpected calcification. Skull: No acute calvarial abnormality. Sinuses/Orbits: No acute findings Other: None IMPRESSION: No acute intracranial abnormality. Electronically Signed   By: Rolm Baptise M.D.  On: 05/16/2020 11:26   CT Chest Wo Contrast  Result Date: 05/16/2020 CLINICAL DATA:  Left chest and scapular pain after fall down stairs EXAM: CT CHEST WITHOUT CONTRAST TECHNIQUE: Multidetector CT imaging of the chest was performed following the standard protocol without IV contrast. COMPARISON:  Chest x-ray 12/07/2019 FINDINGS: Cardiovascular: Heart size is normal. No pericardial effusion. Thoracic aorta is nonaneurysmal. There are atherosclerotic calcifications of the aorta and coronary arteries. Patient is status post CABG. Mediastinum/Nodes: No axillary, mediastinal, or hilar lymphadenopathy evident on non contrasted exam. Thyroid, trachea, and esophagus appear within normal limits. Lungs/Pleura: Moderate bilateral pleural effusions with associated compressive atelectasis. No pneumothorax. Upper Abdomen: No acute abnormality. Musculoskeletal: Acute mildly comminuted and relatively nondisplaced fracture of the left scapular spine. Acute fracture of the superior border of the left coracoid process which is superiorly displaced by approximately 11 mm (series 6, image 168). Acromioclavicular and glenohumeral joint alignment appears maintained without dislocation. No  acute rib fracture. Thoracic vertebral body heights and alignment are maintained without evidence of fracture. Soft tissue edema and small amount of hemorrhage at the fracture sites. No well-defined hematoma. IMPRESSION: 1. Acute fractures of the left scapula including a mildly comminuted and relatively nondisplaced fracture of the left scapular spine. Acute fracture of the superior border of the coracoid process which is superiorly displaced by approximately 11 mm. 2. Negative for rib fracture. 3. Moderate bilateral pleural effusions with associated compressive atelectasis. No pneumothorax. Aortic Atherosclerosis (ICD10-I70.0). Electronically Signed   By: Duanne Guess D.O.   On: 05/16/2020 11:59   CT Cervical Spine Wo Contrast  Result Date: 05/16/2020 CLINICAL DATA:  Fall down stairs. EXAM: CT CERVICAL SPINE WITHOUT CONTRAST TECHNIQUE: Multidetector CT imaging of the cervical spine was performed without intravenous contrast. Multiplanar CT image reconstructions were also generated. COMPARISON:  None. FINDINGS: Alignment: Normal Skull base and vertebrae: No acute fracture. No primary bone lesion or focal pathologic process. Soft tissues and spinal canal: No prevertebral fluid or swelling. No visible canal hematoma. Disc levels: Degenerative disc disease at C5-6 and C6-7 with disc space narrowing and spurring. Bilateral degenerative facet disease, left greater than right. Upper chest: Large bilateral pleural effusions partially imaged. Other: None IMPRESSION: Cervical spondylosis.  No acute bony abnormality. Large bilateral pleural effusions. Electronically Signed   By: Charlett Nose M.D.   On: 05/16/2020 11:27   CT T-SPINE NO CHARGE  Result Date: 05/16/2020 CLINICAL DATA:  Fall down stairs. EXAM: CT THORACIC SPINE WITHOUT CONTRAST TECHNIQUE: Multidetector CT images of the thoracic were obtained using the standard protocol without intravenous contrast. COMPARISON:  None. FINDINGS: Alignment: Normal.  Vertebrae: No definite acute fracture is identified. Curvilinear lucency in the left T3 transverse process appears partially corticated and is favored to reflect a vascular channel rather than nondisplaced fracture with less conspicuous suspected vascular channels and/or artifact seen in other transverse processes including on the right at T3. Paraspinal and other soft tissues: No acute abnormality identified in the paraspinal soft tissues. Intrathoracic contents reported separately. Disc levels: Mild thoracic spondylosis without evidence of a high-grade stenosis. IMPRESSION: No acute osseous abnormality identified in the thoracic spine. Electronically Signed   By: [facility] Ache M.D.   On: 05/16/2020 11:40   DG Shoulder Left  Result Date: 05/16/2020 CLINICAL DATA:  Fall down stairs.  Left shoulder pain. EXAM: LEFT SHOULDER - 2+ VIEW COMPARISON:  None. FINDINGS: There is a fracture through the left scapula at the base of the acromion, minimal displacement. Degenerative changes in the [facility] joint. Visualized  humerus unremarkable. No dislocation. IMPRESSION: Scapular fracture at the base of the left acromion. Electronically Signed   By: Rolm Baptise M.D.   On: 05/16/2020 11:25   DG Knee Complete 4 Views Left  Result Date: 05/16/2020 CLINICAL DATA:  Left knee swelling after fall today. EXAM: LEFT KNEE - COMPLETE 4+ VIEW COMPARISON:  None. FINDINGS: No evidence of fracture, dislocation, or joint effusion. No evidence of arthropathy or other focal bone abnormality. Soft tissues are unremarkable. IMPRESSION: Negative. Electronically Signed   By: Marijo Conception M.D.   On: 05/16/2020 11:29   DG Humerus Left  Result Date: 05/16/2020 CLINICAL DATA:  Left shoulder pain after fall down stairs today. EXAM: LEFT HUMERUS - 2+ VIEW COMPARISON:  None. FINDINGS: Mildly displaced fracture is seen involving the left acromion. Visualized humerus is unremarkable. IMPRESSION: Mildly displaced left acromial fracture. Electronically  Signed   By: Marijo Conception M.D.   On: 05/16/2020 11:28    ROS all other systems noncontributory to HPI. Blood pressure 118/71, pulse 62, temperature 98.8 F (37.1 C), temperature source Oral, resp. rate 17, height 6\' 2"  (1.88 m), weight 79.4 kg, SpO2 94 %. Physical Exam Constitutional:      Appearance: Normal appearance.  HENT:     Head: Normocephalic.     Right Ear: External ear normal.     Left Ear: External ear normal.     Nose: Nose normal.  Eyes:     Extraocular Movements: Extraocular movements intact.  Cardiovascular:     Comments: irreg Pulmonary:     Effort: Pulmonary effort is normal. No respiratory distress.     Breath sounds: No stridor.  Musculoskeletal:     Cervical back: Normal range of motion.  Neurological:     Mental Status: He is alert.   Sensation of the left forearm and hand is intact he can flex and extend his elbow.  Well-healed scar following the radial artery from wrist almost to the elbow.  Assessment/Plan: Left closed scapular fracture base of the acromion also coracoid fracture with some displacement.  We discussed use of the sling.  Patient will not require surgery and should get satisfactory healing with conservative treatment.  He can follow-up with me in 2 weeks in the office.  Marybelle Killings 05/17/2020, 8:58 AM

## 2020-05-17 NOTE — Discharge Instructions (Signed)
Use sling left arm when up . May remove when sitting or not active. See Dr. Lorin Mercy in about 2 wks

## 2020-05-17 NOTE — Progress Notes (Signed)
  Echocardiogram 2D Echocardiogram has been performed.  [name] [name] 05/17/2020, 3:46 PM

## 2020-05-18 ENCOUNTER — Ambulatory Visit: Payer: HMO | Admitting: Internal Medicine

## 2020-05-18 DIAGNOSIS — R739 Hyperglycemia, unspecified: Secondary | ICD-10-CM

## 2020-05-18 LAB — CBC
HCT: 25.8 % — ABNORMAL LOW (ref 39.0–52.0)
Hemoglobin: 8.9 g/dL — ABNORMAL LOW (ref 13.0–17.0)
MCH: 33 pg (ref 26.0–34.0)
MCHC: 34.5 g/dL (ref 30.0–36.0)
MCV: 95.6 fL (ref 80.0–100.0)
Platelets: 150 10*3/uL (ref 150–400)
RBC: 2.7 MIL/uL — ABNORMAL LOW (ref 4.22–5.81)
RDW: 13.1 % (ref 11.5–15.5)
WBC: 5.8 10*3/uL (ref 4.0–10.5)
nRBC: 0 % (ref 0.0–0.2)

## 2020-05-18 LAB — BASIC METABOLIC PANEL
Anion gap: 5 (ref 5–15)
BUN: 20 mg/dL (ref 8–23)
CO2: 28 mmol/L (ref 22–32)
Calcium: 8.2 mg/dL — ABNORMAL LOW (ref 8.9–10.3)
Chloride: 100 mmol/L (ref 98–111)
Creatinine, Ser: 0.77 mg/dL (ref 0.61–1.24)
GFR, Estimated: >60 mL/min (ref >60)
Glucose, Bld: 117 mg/dL — ABNORMAL HIGH (ref 70–99)
Potassium: 4 mmol/L (ref 3.5–5.1)
Sodium: 133 mmol/L — ABNORMAL LOW (ref 135–145)

## 2020-05-18 LAB — GLUCOSE, CAPILLARY
Glucose-Capillary: 155 mg/dL — ABNORMAL HIGH (ref 70–99)
Glucose-Capillary: 123 mg/dL — ABNORMAL HIGH (ref 70–99)
Glucose-Capillary: 125 mg/dL — ABNORMAL HIGH (ref 70–99)
Glucose-Capillary: 137 mg/dL — ABNORMAL HIGH (ref 70–99)

## 2020-05-18 LAB — HEPATIC FUNCTION PANEL
ALT: 12 U/L (ref 0–44)
AST: 14 U/L — ABNORMAL LOW (ref 15–41)
Albumin: 2.6 g/dL — ABNORMAL LOW (ref 3.5–5.0)
Alkaline Phosphatase: 42 U/L (ref 38–126)
Bilirubin, Direct: 0.2 mg/dL (ref 0.0–0.2)
Indirect Bilirubin: 0.5 mg/dL (ref 0.3–0.9)
Total Bilirubin: 0.7 mg/dL (ref 0.3–1.2)
Total Protein: 4.7 g/dL — ABNORMAL LOW (ref 6.5–8.1)

## 2020-05-18 MED ORDER — POLYETHYLENE GLYCOL 3350 17 G PO PACK: 17.0000 g | PACK | Freq: Two times a day (BID) | ORAL | Status: DC

## 2020-05-18 MED ORDER — POLYETHYLENE GLYCOL 3350 17 G PO PACK
17.0000 g | PACK | Freq: Two times a day (BID) | ORAL | Status: AC
  Administered 2020-05-19: 17 g via ORAL
  Filled 2020-05-18 (×2): qty 1

## 2020-05-18 MED ORDER — HYDROCODONE-ACETAMINOPHEN 5-325 MG PO TABS
1.0000 | ORAL_TABLET | Freq: Four times a day (QID) | ORAL | Status: DC | PRN
Start: 2020-05-18 — End: 2020-05-25
  Administered 2020-05-18 – 2020-05-23 (×8): 2 via ORAL
  Filled 2020-05-18 (×8): qty 2

## 2020-05-18 MED ORDER — PANTOPRAZOLE SODIUM 40 MG PO TBEC
40.0000 mg | DELAYED_RELEASE_TABLET | Freq: Every day | ORAL | Status: DC
  Administered 2020-05-18 – 2020-05-24 (×7): 40 mg via ORAL
  Filled 2020-05-18 (×7): qty 1

## 2020-05-18 NOTE — Plan of Care (Signed)
  Problem: Education: Goal: Knowledge of General Education information will improve Description: Including pain rating scale, medication(s)/side effects and non-pharmacologic comfort measures Outcome: Progressing   Problem: Activity: Goal: Risk for activity intolerance will decrease Outcome: Progressing   Problem: Nutrition: Goal: Adequate nutrition will be maintained Outcome: Progressing   

## 2020-05-18 NOTE — Progress Notes (Signed)
OT Cancellation Note  Patient Details Name: [name] [name] MRN: [id number] DOB: [birth year]   Cancelled Treatment:    Reason Eval/Treat Not Completed: Pain limiting ability to participate. Spoke with nursing, plan to reattempt, time with pain meds.  Tyrone Schimke, OT Acute Rehabilitation Services Pager: [phone] Office: [phone]  05/18/2020, 9:10 AM

## 2020-05-18 NOTE — Plan of Care (Signed)
  Problem: Education: Goal: Knowledge of General Education information will improve Description: Including pain rating scale, medication(s)/side effects and non-pharmacologic comfort measures Outcome: Progressing   Problem: Health Behavior/Discharge Planning: Goal: Ability to manage health-related needs will improve Outcome: Progressing   Problem: Clinical Measurements: Goal: Ability to maintain clinical measurements within normal limits will improve Outcome: Progressing Goal: Will remain free from infection Outcome: Progressing Goal: Diagnostic test results will improve Outcome: Progressing Goal: Respiratory complications will improve Outcome: Progressing Goal: Cardiovascular complication will be avoided Outcome: Progressing   Problem: Elimination: Goal: Will not experience complications related to bowel motility Outcome: Progressing Goal: Will not experience complications related to urinary retention Outcome: Progressing   Problem: Pain Managment: Goal: General experience of comfort will improve Outcome: Progressing   Problem: Safety: Goal: Ability to remain free from injury will improve Outcome: Progressing   Problem: Skin Integrity: Goal: Risk for impaired skin integrity will decrease Outcome: Progressing   

## 2020-05-18 NOTE — Progress Notes (Signed)
PT Cancellation Note  Patient Details Name: [name] [name] MRN: [id number] DOB: 03-27-1955   Cancelled Treatment:      Pt's dinner just arrived.  He did work with OT earlier today.  Will f/u as able. Abran Richard, PT Acute Rehab Services Pager [phone] [facility] 05/18/2020, 5:14 PM

## 2020-05-18 NOTE — Progress Notes (Signed)
[facility] PROGRESS NOTE    Progress Note  [name] [name]  MBW:[id number] DOB: 11-Feb-1955 DOA: 05/16/2020 PCP: Etta Grandchild, MD     Brief Narrative:   [name] [name] is an 65 y.o. male past medical history of atrial fibrillation on Eliquis, uncontrolled insulin-dependent diabetes mellitus type 2, CAD status post CABG times 01/2001 came into the ED after falling approximately 17 stairs after extensive radiologic evaluation he was diagnosed with a left scapular fracture and coracoid fracture.  He was initially admitted to the trauma service and Dr. Ophelia Charter recommended nonoperative management.  Work-up is underway for bilateral pleural effusion Eliquis was held due to decreasing hemoglobin and effusions.  Assessment/Plan:   Bilateral pleural effusion: Symptomatic but no hypoxia. Holding anticoagulation. 2D echo was done that showed an EF of 55% no wall motion abnormalities diastolic parameters are indeterminate.  Moderate pleural effusion on the left no significant valvular abnormality. He satting greater 94% on room air. Will perform thoracocentesis as there has been a significant drop in hemoglobin from 13-9 concerned about hemothorax. He relates his pain is controlled. Will start on MiraLAX p.o. twice daily.  Left scapular and coracoid fracture: Orthopedic surgery recommended conservative management with a sling follow-up with him in 2 weeks. Therapy evaluated the patient and recommended home health PT.  CAD with a history of CABG in 2003: No anginal symptoms continue statins holding ACE inhibitor aspirin and Eliquis. Cardiology appointment has been scheduled for 05/26/2020.  Paroxysmal atrial fibrillation: Rate control holding Eliquis.  Lower extremity swelling: DVT.  Normocytic anemia: There was a drop in hemoglobin from 13-9.5 in the setting of Eliquis and trauma. Eliquis has been on hold. Currently on Protonix.  Poorly controlled diabetes mellitus type  2: With an A1c of 12 he reported he stopped his Lantus due to nighttime low blood glucose. He was restarted on lower dose of Lantus with sliding scale continue current regimen.   DVT prophylaxis: scd Family Communication:mother Status is: Inpatient  Remains inpatient appropriate because:Hemodynamically unstable   Dispo: The patient is from: Home              Anticipated d/c is to: Home              Patient currently is not medically stable to d/c.   Difficult to place patient No        Code Status:     Code Status Orders  (From admission, onward)         Start     Ordered   05/16/20 1931  Full code  Continuous        05/16/20 1933        Code Status History    This patient has a current code status but no historical code status.   Advance Care Planning Activity        IV Access:    Peripheral IV   Procedures and diagnostic studies:   ECHOCARDIOGRAM COMPLETE BUBBLE STUDY  Result Date: 05/17/2020    ECHOCARDIOGRAM REPORT   Patient Name:   [name] [name] Date of Exam: 05/17/2020 Medical Rec #:  [id number]            Height:       74.0 in Accession #:    [id number]           Weight:       175.0 lb Date of Birth:  [birth year]             BSA:  2.054 m Patient Age:    1 years             BP:           107/72 mmHg Patient Gender: M                    HR:           72 bpm. Exam Location:  Inpatient Procedure: 2D Echo, Cardiac Doppler, Color Doppler and Saline Contrast Bubble            Study Indications:    R06.02 SOB  History:        Patient no has prior history of Echocardiogram examinations.                 CAD, Signs/Symptoms:Shortness of Breath; Risk                 Factors:Hypertension, Dyslipidemia and Diabetes.  Sonographer:    Bernadene Person RDCS Referring Phys: [place]  Sonographer Comments: Pt unable to turn on left side. IMPRESSIONS  1. Left ventricular ejection fraction, by estimation, is 55 to 60%. The left ventricle has normal  function. The left ventricle has no regional wall motion abnormalities. Left ventricular diastolic parameters are indeterminate.  2. Right ventricular systolic function is low normal. The right ventricular size is normal. There is mildly elevated pulmonary artery systolic pressure.  3. Moderate pleural effusion in the left lateral region.  4. The mitral valve is grossly normal. No evidence of mitral valve regurgitation.  5. The aortic valve is tricuspid. There is moderate thickening of the aortic valve. Aortic valve regurgitation is not visualized.  6. There is borderline dilatation of the aortic root, measuring 38 mm. There is borderline dilatation of the ascending aorta, measuring 39 mm. These are within normal limits for age and BSA.  7. Agitated saline contrast bubble study was negative, with no evidence of any interatrial shunt. Comparison(s): No prior Echocardiogram. FINDINGS  Left Ventricle: Left ventricular ejection fraction, by estimation, is 55 to 60%. The left ventricle has normal function. The left ventricle has no regional wall motion abnormalities. The left ventricular internal cavity size was normal in size. There is  no concentric left ventricular hypertrophy of the basal-septal segment. Left ventricular diastolic parameters are indeterminate. Right Ventricle: The right ventricular size is normal. No increase in right ventricular wall thickness. Right ventricular systolic function is low normal. There is mildly elevated pulmonary artery systolic pressure. The tricuspid regurgitant velocity is 3.13 m/s, and with an assumed right atrial pressure of 3 mmHg, the estimated right ventricular systolic pressure is 86.7 mmHg. Left Atrium: Left atrial size was normal in size. Right Atrium: Right atrial size was normal in size. Pericardium: There is no evidence of pericardial effusion. Mitral Valve: The mitral valve is grossly normal. No evidence of mitral valve regurgitation. Tricuspid Valve: The tricuspid  valve is not well visualized. Tricuspid valve regurgitation is not demonstrated. Aortic Valve: The aortic valve is tricuspid. There is moderate thickening of the aortic valve. Aortic valve regurgitation is not visualized. Pulmonic Valve: Discrepancy betweeen color and Spectral Doppler. The pulmonic valve was normal in structure. Pulmonic valve regurgitation is not visualized. No evidence of pulmonic stenosis. Aorta: Aortic dilatation noted. There is borderline dilatation of the aortic root, measuring 38 mm. There is borderline dilatation of the ascending aorta, measuring 39 mm. IAS/Shunts: The atrial septum is grossly normal. Agitated saline contrast was given intravenously to evaluate for intracardiac shunting.  Agitated saline contrast bubble study was negative, with no evidence of any interatrial shunt. Additional Comments: There is a moderate pleural effusion in the left lateral region.  LEFT VENTRICLE PLAX 2D LVIDd:         4.20 cm LVIDs:         2.40 cm LV PW:         2.50 cm LV IVS:        1.10 cm LVOT diam:     2.10 cm LV SV:         56 LV SV Index:   27 LVOT Area:     3.46 cm  RIGHT VENTRICLE TAPSE (M-mode): 1.0 cm LEFT ATRIUM           Index       RIGHT ATRIUM           Index LA diam:      4.00 cm 1.95 cm/m  RA Area:     13.20 cm LA Vol (A2C): 35.5 ml 17.29 ml/m RA Volume:   36.20 ml  17.63 ml/m LA Vol (A4C): 39.8 ml 19.38 ml/m  AORTIC VALVE LVOT Vmax:   67.20 cm/s LVOT Vmean:  55.700 cm/s LVOT VTI:    0.163 m  AORTA Ao Root diam: 3.80 cm Ao Asc diam:  3.90 cm TRICUSPID VALVE TR Peak grad:   39.2 mmHg TR Vmax:        313.00 cm/s  SHUNTS Systemic VTI:  0.16 m Systemic Diam: 2.10 cm Rudean Haskell MD Electronically signed by Rudean Haskell MD Signature Date/Time: 05/17/2020/4:21:06 PM    Final    VAS [place] LOWER EXTREMITY VENOUS (DVT)  Result Date: 05/17/2020  Lower Venous DVT Study Patient Name:  MARTAVION BEECK  Date of Exam:   05/17/2020 Medical Rec #: [id number]             Accession  #:    [id number] Date of Birth: 1955-01-17              Patient Gender: M Patient Age:   65Y Exam Location:  [facility] Procedure:      VAS [place] LOWER EXTREMITY VENOUS (DVT) Referring Phys: [id number] EKTA V PATEL --------------------------------------------------------------------------------  Indications: Edema.  Anticoagulation: Recently d/c Eliquis. Comparison Study: No prior venous studies. Performing Technologist: Darlin Coco RDMS,RVT  Examination Guidelines: A complete evaluation includes B-mode imaging, spectral Doppler, color Doppler, and power Doppler as needed of all accessible portions of each vessel. Bilateral testing is considered an integral part of a complete examination. Limited examinations for reoccurring indications may be performed as noted. The reflux portion of the exam is performed with the patient in reverse Trendelenburg.  +---------+---------------+---------+-----------+----------+--------------+ RIGHT    CompressibilityPhasicitySpontaneityPropertiesThrombus Aging +---------+---------------+---------+-----------+----------+--------------+ CFV      Full           Yes      Yes                                 +---------+---------------+---------+-----------+----------+--------------+ SFJ      Full                                                        +---------+---------------+---------+-----------+----------+--------------+ FV Prox  Full                                                        +---------+---------------+---------+-----------+----------+--------------+  FV Mid   Full                                                        +---------+---------------+---------+-----------+----------+--------------+ FV DistalFull                                                        +---------+---------------+---------+-----------+----------+--------------+ PFV      Full                                                         +---------+---------------+---------+-----------+----------+--------------+ POP      Full           Yes      Yes                                 +---------+---------------+---------+-----------+----------+--------------+ PTV      Full                                                        +---------+---------------+---------+-----------+----------+--------------+ PERO     Full                                                        +---------+---------------+---------+-----------+----------+--------------+   +---------+---------------+---------+-----------+----------+--------------+ LEFT     CompressibilityPhasicitySpontaneityPropertiesThrombus Aging +---------+---------------+---------+-----------+----------+--------------+ CFV      Full           Yes      Yes                                 +---------+---------------+---------+-----------+----------+--------------+ SFJ      Full                                                        +---------+---------------+---------+-----------+----------+--------------+ FV Prox  Full                                                        +---------+---------------+---------+-----------+----------+--------------+ FV Mid   Full                                                        +---------+---------------+---------+-----------+----------+--------------+  FV DistalFull                                                        +---------+---------------+---------+-----------+----------+--------------+ PFV      Full                                                        +---------+---------------+---------+-----------+----------+--------------+ POP      Full           Yes      Yes                                 +---------+---------------+---------+-----------+----------+--------------+ PTV      Full                                                         +---------+---------------+---------+-----------+----------+--------------+ PERO     Full                                                        +---------+---------------+---------+-----------+----------+--------------+     Summary: RIGHT: - There is no evidence of deep vein thrombosis in the lower extremity.  - No cystic structure found in the popliteal fossa.  LEFT: - There is no evidence of deep vein thrombosis in the lower extremity.  - No cystic structure found in the popliteal fossa.  *See table(s) above for measurements and observations. Electronically signed by Deitra Mayo MD on 05/17/2020 at 1:07:23 PM.    Final      Medical Consultants:    None.   Subjective:    Brooke Pace no complaints he relates his pain is under control with current medications.  Objective:    Vitals:   05/17/20 0900 05/17/20 1645 05/17/20 2028 05/18/20 0502  BP: 107/72 116/65 114/60 104/65  Pulse: 66 60 (!) 59 (!) 59  Resp: 18 18 18 18   Temp: 99.8 F (37.7 C) (!) 100.8 F (38.2 C) (!) 101.1 F (38.4 C) 99.8 F (37.7 C)  TempSrc: Oral Oral Oral Oral  SpO2: 96% 92% 92% 94%  Weight:      Height:       SpO2: 94 %   Intake/Output Summary (Last 24 hours) at 05/18/2020 1202 Last data filed at 05/17/2020 2200 Gross per 24 hour  Intake 360 ml  Output 350 ml  Net 10 ml   Filed Weights   05/16/20 1003  Weight: 79.4 kg    Exam: General exam: In no acute distress. Respiratory system: Good air movement and clear to auscultation. Cardiovascular system: S1 & S2 heard, RRR. No JVD. Gastrointestinal system: Abdomen is nondistended, soft and nontender.  Extremities: No pedal edema. Skin: No rashes, lesions or ulcers Psychiatry: Judgement and insight appear normal. Mood & affect appropriate.  Data Reviewed:    Labs: Basic Metabolic Panel: Recent Labs  Lab 05/16/20 1256 05/17/20 0457 05/18/20 0405  NA 134* 134* 133*  K 4.2 4.7 4.0  CL 101 100 100  CO2 27 25 28    GLUCOSE 407* 267* 117*  BUN 12 13 20   CREATININE 0.55* 0.71 0.77  CALCIUM 7.9* 8.4* 8.2*   GFR Estimated Creatinine Clearance: 100.6 mL/min (by C-G formula based on SCr of 0.77 mg/dL). Liver Function Tests: Recent Labs  Lab 05/17/20 0457  AST 16  ALT 12  ALKPHOS 45  BILITOT 1.1  PROT 4.6*  ALBUMIN 2.7*   No results for input(s): LIPASE, AMYLASE in the last 168 hours. No results for input(s): AMMONIA in the last 168 hours. Coagulation profile No results for input(s): INR, PROTIME in the last 168 hours. COVID-19 Labs  Recent Labs    05/16/20 04-26-2026  FERRITIN 418*    Lab Results  Component Value Date   SARSCOV2NAA NEGATIVE 05/16/2020   SARSCOV2NAA NEGATIVE 12/07/2019   Muse NEGATIVE 12/03/2019    CBC: Recent Labs  Lab 05/16/20 1256 05/17/20 0457 05/18/20 0405  WBC 7.3 5.1 5.8  HGB 10.3* 9.4* 8.9*  HCT 31.7* 29.0* 25.8*  MCV 99.1 100.0 95.6  PLT 152 156 150   Cardiac Enzymes: No results for input(s): CKTOTAL, CKMB, CKMBINDEX, TROPONINI in the last 168 hours. BNP (last 3 results) No results for input(s): PROBNP in the last 8760 hours. CBG: Recent Labs  Lab 05/17/20 0855 05/17/20 1129 05/17/20 1643 05/17/20 2025 05/18/20 0653  GLUCAP 312* 240* 231* 189* 155*   D-Dimer: No results for input(s): DDIMER in the last 72 hours. Hgb A1c: Recent Labs    05/16/20 April 26, 2026  HGBA1C 12.7*   Lipid Profile: No results for input(s): CHOL, HDL, LDLCALC, TRIG, CHOLHDL, LDLDIRECT in the last 72 hours. Thyroid function studies: Recent Labs    05/16/20 Apr 26, 2026  TSH 2.845   Anemia work up: Recent Labs    05/16/20 26-Apr-2026  VITAMINB12 337  FOLATE 26.0  FERRITIN 418*  TIBC 273  IRON 33*  RETICCTPCT 2.8   Sepsis Labs: Recent Labs  Lab 05/16/20 1256 05/17/20 0457 05/18/20 0405  WBC 7.3 5.1 5.8   Microbiology Recent Results (from the past 240 hour(s))  Resp Panel by RT-PCR (Flu A&B, Covid) Nasopharyngeal Swab     Status: None   Collection Time:  05/16/20  1:06 PM   Specimen: Nasopharyngeal Swab; Nasopharyngeal(NP) swabs in vial transport medium  Result Value Ref Range Status   SARS Coronavirus 2 by RT PCR NEGATIVE NEGATIVE Final    Comment: (NOTE) SARS-CoV-2 target nucleic acids are NOT DETECTED.  The SARS-CoV-2 RNA is generally detectable in upper respiratory specimens during the acute phase of infection. The lowest concentration of SARS-CoV-2 viral copies this assay can detect is 138 copies/mL. A negative result does not preclude SARS-Cov-2 infection and should not be used as the sole basis for treatment or other patient management decisions. A negative result may occur with  improper specimen collection/handling, submission of specimen other than nasopharyngeal swab, presence of viral mutation(s) within the areas targeted by this assay, and inadequate number of viral copies(<138 copies/mL). A negative result must be combined with clinical observations, patient history, and epidemiological information. The expected result is Negative.  Fact Sheet for Patients:  EntrepreneurPulse.com.au  Fact Sheet for Healthcare Providers:  IncredibleEmployment.be  This test is no t yet approved or cleared by the [place] FDA and  has been authorized for detection and/or diagnosis of SARS-CoV-2  by FDA under an Emergency Use Authorization (EUA). This EUA will remain  in effect (meaning this test can be used) for the duration of the COVID-19 declaration under Section 564(b)(1) of the Act, 21 U.S.C.section 360bbb-3(b)(1), unless the authorization is terminated  or revoked sooner.       Influenza A by PCR NEGATIVE NEGATIVE Final   Influenza B by PCR NEGATIVE NEGATIVE Final    Comment: (NOTE) The Xpert Xpress SARS-CoV-2/FLU/RSV plus assay is intended as an aid in the diagnosis of influenza from Nasopharyngeal swab specimens and should not be used as a sole basis for treatment. Nasal washings  and aspirates are unacceptable for Xpert Xpress SARS-CoV-2/FLU/RSV testing.  Fact Sheet for Patients: EntrepreneurPulse.com.au  Fact Sheet for Healthcare Providers: IncredibleEmployment.be  This test is not yet approved or cleared by the [place] FDA and has been authorized for detection and/or diagnosis of SARS-CoV-2 by FDA under an Emergency Use Authorization (EUA). This EUA will remain in effect (meaning this test can be used) for the duration of the COVID-19 declaration under Section 564(b)(1) of the Act, 21 U.S.C. section 360bbb-3(b)(1), unless the authorization is terminated or revoked.  Performed at [facility], [address] [place], [place] [address]      Medications:   . atorvastatin  40 mg Oral Daily  . insulin aspart  0-15 Units Subcutaneous TID WC  . insulin aspart  0-5 Units Subcutaneous QHS  . insulin aspart  5 Units Subcutaneous TID WC  . insulin glargine  20 Units Subcutaneous Daily  . mirtazapine  15 mg Oral QHS  . pantoprazole  40 mg Oral Daily  . PARoxetine  20 mg Oral Daily  . polyethylene glycol  17 g Oral BID  . sodium chloride flush  3 mL Intravenous Q12H  . traZODone  25 mg Oral QHS   Continuous Infusions: . sodium chloride        LOS: 2 days   [facility]  05/18/2020, 12:02 PM

## 2020-05-18 NOTE — Evaluation (Addendum)
Occupational Therapy Evaluation Patient Details Name: [name] [name] MRN: [id number] DOB: [birth year] Today's Date: 05/18/2020    History of Present Illness [name] [name] is a 65 y.o. male seen for pleural effusion with hypoxia, as well as a fall resulting in L closed scapular fx, to be managed with a sling, per Ortho   Clinical Impression   Pt admitted with the above diagnoses and presents with below problem list. Pt will benefit from continued acute OT to address the below listed deficits and maximize independence with basic ADLs prior to d/c to venue below. Unclear PLOF but it sounds like he was struggling with ADLs at home, mother reports he uses a cane, sleeps in recliner and sponge bathes on ground floor, upstairs to shower 1-2x a week. Of his having to go up and down a full flight of stairs to access shower his mother reports "I think that's how he fell." Pt currently needs +2 assist for bed mobility and to pivot to/from recliner. He adamantly refuses SNF for rehab "I'll get better faster going home." After asking how he plans to toilet at home he quickly states "I'll just get up and go." Advised pt that he is at a high risks for a fall at home. He will have no physical assist at home and has decreased insight into deficits. Plan to try w/c level transfers next session to try to come up with a safer plan for functional transfers at home.      Follow Up Recommendations  SNF;Supervision/Assistance - 24 hour;Other (comment) (pt adamantly refusing SNF for rehab. recommend maximizing [facility] services)    Equipment Recommendations  Drop arm 3 in 1 bedside commode;Wheelchair (measurements OT);Wheelchair cushion (measurements OT)    Recommendations for Other Services       Precautions / Restrictions Precautions Precautions: Fall Required Braces or Orthoses: Sling (pt did better with sling loosened a bit and wearing waist strap. Immediately asking to take off sling once in the  recliner) Restrictions Other Position/Activity Restrictions: No weight bearing status in order set; Will proceed as NWB LUE unless otherwise ordered      Mobility Bed Mobility Overal bed mobility: Needs Assistance Bed Mobility: Supine to Sit     Supine to sit: Mod assist;+2 for safety/equipment;Max assist;HOB elevated     General bed mobility comments: Multimodal cueing to sequence and initiate. Physical assist to advance L foot off EOB. Assist to power up trunk and pivot hips to full EOB position    Transfers Overall transfer level: Needs assistance Equipment used: 2 person hand held assist Transfers: Sit to/from [facility] Sit to Stand: Mod assist;+2 safety/equipment Stand pivot transfers: Mod assist;+2 physical assistance;+2 safety/equipment       General transfer comment: assist given utilizing gait belt and second person on pt's right side. From elevated EOB to recliner boosted up with one pillow. Assist to steady while taking pivotal steps. Pt prematurely initiating sitting down "I gotta sit" with physical assist needed to advance hips and control descent into recliner.    Balance Overall balance assessment: Needs assistance   Sitting balance-Leahy Scale: Poor Sitting balance - Comments: posterior lean. min A for static sitting balance Postural control: Posterior lean Standing balance support: Single extremity supported Standing balance-Leahy Scale: Poor Standing balance comment: mod A +2 for safety in static standing                           ADL either performed or  assessed with clinical judgement   ADL Overall ADL's : Needs assistance/impaired Eating/Feeding: Set up;Sitting Eating/Feeding Details (indicate cue type and reason): able to sit with no back support in recliner and feed self with set up provided Grooming: Sitting;Maximal assistance   Upper Body Bathing: Maximal assistance;Sitting   Lower Body Bathing: Moderate  assistance;+2 for physical assistance;+2 for safety/equipment;Sit to/from stand   Upper Body Dressing : Maximal assistance;Sitting   Lower Body Dressing: Moderate assistance;+2 for physical assistance;+2 for safety/equipment;Sit to/from stand   Toilet Transfer: Minimal assistance;Moderate assistance;+2 for physical assistance;+2 for safety/equipment;Stand-pivot;BSC   Toileting- Clothing Manipulation and Hygiene: Maximal assistance;Sitting/lateral lean;Sit to/from stand         General ADL Comments: Pt currently needs +2 for bed mobility, min A for static sitting EOB, +2 for SPT. Cognition, weakness, and pain are some of his limiting factors.     Vision         Perception     Praxis      Pertinent Vitals/Pain Pain Assessment: Faces Faces Pain Scale: Hurts whole lot Pain Location: L shoulder, both anterior and posterior aspects, with any movement Pain Descriptors / Indicators: Aching;Grimacing;Guarding;Moaning Pain Intervention(s): Premedicated before session;Monitored during session;Limited activity within patient's tolerance;Repositioned     Hand Dominance Right   Extremity/Trunk Assessment Upper Extremity Assessment Upper Extremity Assessment: Generalized weakness;LUE deficits/detail LUE Deficits / Details: grimaces/moans with any movement especially in unsupported sitting. Pt wearing a leather glove on left hand because his hands get cold per his report "I don't have the other glove with me". Loosened sling shoulder strap and utilized waist strap this session in order for pt to agree to wear sling. LUE: Unable to fully assess due to pain LUE Sensation:  (pt r/o cold hands at baseline, wears leather gloves)   Lower Extremity Assessment Lower Extremity Assessment: Defer to PT evaluation       Communication Communication Communication: HOH;Other (comment) (often needs infomation/questions repeated)   Cognition Arousal/Alertness: Awake/alert Behavior During Therapy:  Flat affect (annoyed/mildly agitated) Overall Cognitive Status: Impaired/Different from baseline Area of Impairment: Attention;Memory;Following commands;Safety/judgement;Awareness;Problem solving                   Current Attention Level: Focused;Sustained Memory: Decreased short-term memory;Decreased recall of precautions Following Commands: Follows one step commands with increased time Safety/Judgement: Decreased awareness of safety;Decreased awareness of deficits Awareness: Intellectual Problem Solving: Slow processing;Decreased initiation;Difficulty sequencing;Requires verbal cues;Requires tactile cues General Comments: multi modal cueing needed at times. Pt reports he feels he will be fine to go home even with no  one present to physically assist. Unclear baseline cognition.   General Comments  Pt's mother present throughout session. Discussed d/c plan, potential options to support his plan to d/c home. Mother is unable to physically assist due to cardiac issues.    Exercises     Shoulder Instructions      Home Living Family/patient expects to be discharged to:: Private residence Living Arrangements: Children;Other (Comment) Available Help at Discharge: Other (Comment) (per pt's mother pt's son is not there very often) Type of Home: House Home Access: Stairs to enter [employer] of Steps: 4-5 Entrance Stairs-Rails: Right;Left Home Layout: Two level;1/2 bath on main level;Bed/bath upstairs;Other (Comment) (shower and clothes are upstairs)     Bathroom Shower/Tub: Other (comment) (showers upstairrs 1-2x per week otherwise sponge bathes)         Home Equipment: Cane - single point          Prior Functioning/Environment Level of Independence: Needs assistance  Gait / Transfers Assistance Needed: uses cane per mother's report; struggles with stairs and balance at home. I suspect he has been struggling with basic ADLs as well. Pt sleeps in a recliner on the  main floor. ADL's / Homemaking Assistance Needed: Per mother's report his shower is on the second level he goes up there to shower 1-2x week otherwise sponge bathes. Mother: "I think that's how he fell."            OT Problem List:        OT Treatment/Interventions: Self-care/ADL training;DME and/or AE instruction;Therapeutic activities;Cognitive remediation/compensation;Patient/family education;Balance training    OT Goals(Current goals can be found in the care plan section) Acute Rehab OT Goals Patient Stated Goal: home! OT Goal Formulation: With patient/family Time For Goal Achievement: 06/01/20 Potential to Achieve Goals: Good ADL Goals Pt Will Perform Upper Body Dressing: sitting;with min guard assist;with set-up Pt Will Perform Lower Body Dressing: sitting/lateral leans;with min assist Pt Will Transfer to Toilet: with min guard assist;squat pivot transfer;with transfer board;bedside commode Pt Will Perform Toileting - Clothing Manipulation and hygiene: with set-up;with min guard assist;sitting/lateral leans Additional ADL Goal #1: Pt and caregiver will be independent with donning/doffing LUE sling and sling wearing schedule.  OT Frequency: Min 3X/week   Barriers to D/C: Decreased caregiver support;Inaccessible home environment  no physical assist available at d/c. It will mostly be his mom coming in help, son is not around much.       Co-evaluation              AM-PAC OT "6 Clicks" Daily Activity     Outcome Measure Help from another person eating meals?: A Little Help from another person taking care of personal grooming?: A Lot Help from another person toileting, which includes using toliet, bedpan, or urinal?: Total Help from another person bathing (including washing, rinsing, drying)?: Total Help from another person to put on and taking off regular upper body clothing?: A Lot Help from another person to put on and taking off regular lower body clothing?: Total 6  Click Score: 10   End of Session Equipment Utilized During Treatment: Other (comment);Gait belt (sling) Nurse Communication: Other (comment);Mobility status;Precautions (RN nd NT in room for bed mobility and trasnfer to recliner)  Activity Tolerance: Patient limited by fatigue;Patient limited by pain Patient left: in chair;with call bell/phone within reach;with chair alarm set  OT Visit Diagnosis: Unsteadiness on feet (R26.81);Other abnormalities of gait and mobility (R26.89);Muscle weakness (generalized) (M62.81);History of falling (Z91.81);Other symptoms and signs involving cognitive function;Cognitive communication deficit (R41.841);Pain Symptoms and signs involving cognitive functions:  (hypoxia? unclear cognitive baseline)                Time: 1937-9024 OT Time Calculation (min): 46 min Charges:  OT General Charges $OT Visit: 1 Visit OT Evaluation $OT Eval Moderate Complexity: 1 Mod OT Treatments $Self Care/Home Management : 23-37 mins  Tyrone Schimke, OT Acute Rehabilitation Services Pager: [phone] Office: [place], [place] 05/18/2020, 1:54 PM

## 2020-05-19 ENCOUNTER — Inpatient Hospital Stay (HOSPITAL_COMMUNITY): Payer: HMO

## 2020-05-19 HISTORY — PX: IR THORACENTESIS ASP PLEURAL SPACE W/IMG GUIDE: IMG5380

## 2020-05-19 LAB — URINALYSIS, ROUTINE W REFLEX MICROSCOPIC
Bilirubin Urine: NEGATIVE
Glucose, UA: 50 mg/dL — AB
Hgb urine dipstick: NEGATIVE
Ketones, ur: 5 mg/dL — AB
Leukocytes,Ua: NEGATIVE
Nitrite: NEGATIVE
Protein, ur: NEGATIVE mg/dL
Specific Gravity, Urine: 1.015 (ref 1.005–1.030)
pH: 5 (ref 5.0–8.0)

## 2020-05-19 LAB — GLUCOSE, PLEURAL OR PERITONEAL FLUID: Glucose, Fluid: 112 mg/dL

## 2020-05-19 LAB — GRAM STAIN

## 2020-05-19 LAB — BODY FLUID CELL COUNT WITH DIFFERENTIAL
Eos, Fluid: 0 %
Lymphs, Fluid: 66 %
Monocyte-Macrophage-Serous Fluid: 33 % — ABNORMAL LOW (ref 50–90)
Neutrophil Count, Fluid: 1 % (ref 0–25)
Total Nucleated Cell Count, Fluid: 482 cu mm (ref 0–1000)

## 2020-05-19 LAB — CBC
HCT: 25.6 % — ABNORMAL LOW (ref 39.0–52.0)
Hemoglobin: 8.7 g/dL — ABNORMAL LOW (ref 13.0–17.0)
MCH: 32.7 pg (ref 26.0–34.0)
MCHC: 34 g/dL (ref 30.0–36.0)
MCV: 96.2 fL (ref 80.0–100.0)
Platelets: 168 10*3/uL (ref 150–400)
RBC: 2.66 MIL/uL — ABNORMAL LOW (ref 4.22–5.81)
RDW: 12.9 % (ref 11.5–15.5)
WBC: 5.9 10*3/uL (ref 4.0–10.5)
nRBC: 0 % (ref 0.0–0.2)

## 2020-05-19 LAB — GLUCOSE, CAPILLARY
Glucose-Capillary: 120 mg/dL — ABNORMAL HIGH (ref 70–99)
Glucose-Capillary: 144 mg/dL — ABNORMAL HIGH (ref 70–99)
Glucose-Capillary: 199 mg/dL — ABNORMAL HIGH (ref 70–99)
Glucose-Capillary: 183 mg/dL — ABNORMAL HIGH (ref 70–99)

## 2020-05-19 LAB — PROTEIN, PLEURAL OR PERITONEAL FLUID: Total protein, fluid: <3 g/dL

## 2020-05-19 MED ORDER — LIDOCAINE HCL (PF) 1 % IJ SOLN
INTRAMUSCULAR | Status: AC
  Filled 2020-05-19: qty 30

## 2020-05-19 MED ORDER — LIDOCAINE HCL (PF) 1 % IJ SOLN
INTRAMUSCULAR | Status: DC | PRN
  Administered 2020-05-19: 30 mL

## 2020-05-19 MED ORDER — ACETAMINOPHEN 325 MG PO TABS
650.0000 mg | ORAL_TABLET | Freq: Four times a day (QID) | ORAL | Status: DC | PRN
  Administered 2020-05-19 – 2020-05-21 (×2): 650 mg via ORAL
  Filled 2020-05-19 (×2): qty 2

## 2020-05-19 NOTE — Plan of Care (Signed)
  Problem: Health Behavior/Discharge Planning: Goal: Ability to manage health-related needs will improve Outcome: Progressing   Problem: Clinical Measurements: Goal: Respiratory complications will improve Outcome: Progressing   Problem: Clinical Measurements: Goal: Cardiovascular complication will be avoided Outcome: Progressing   Problem: Activity: Goal: Risk for activity intolerance will decrease Outcome: Progressing   Problem: Elimination: Goal: Will not experience complications related to bowel motility Outcome: Progressing Goal: Will not experience complications related to urinary retention Outcome: Progressing   Problem: Pain Managment: Goal: General experience of comfort will improve Outcome: Progressing   Problem: Safety: Goal: Ability to remain free from injury will improve Outcome: Progressing   Problem: Skin Integrity: Goal: Risk for impaired skin integrity will decrease Outcome: Progressing

## 2020-05-19 NOTE — Plan of Care (Signed)
  Problem: Clinical Measurements: Goal: Respiratory complications will improve Outcome: Progressing   

## 2020-05-19 NOTE — TOC CAGE-AID Note (Signed)
Transition of Care [facility]) - CAGE-AID Screening   Patient Details  Name: [name] [name] MRN: [id number] Date of Birth: 1955-12-10  Clinical Narrative:  Patient denies current alcohol or drug use, no need for resources at this time.  CAGE-AID Screening:    Have You Ever Felt You Ought to Cut Down on Your Drinking or Drug Use?: No Have People Annoyed You By Critizing Your Drinking Or Drug Use?: No Have You Felt Bad Or Guilty About Your Drinking Or Drug Use?: No Have You Ever Had a Drink or Used Drugs First Thing In The Morning to Steady Your Nerves or to Get Rid of a Hangover?: No CAGE-AID Score: 0  Substance Abuse Education Offered: No

## 2020-05-19 NOTE — Progress Notes (Addendum)
PT Cancellation Note  Patient Details Name: [name] [name] MRN: [id number] DOB: [birth year]   Cancelled Treatment:    Reason Eval/Treat Not Completed: Patient at procedure or test/unavailable. Will try in PM time permitting  Attempted to see. Pt refused stating tired and in lots of pain following procedure  Lyanne Co, DPT Acute Rehabilitation Services [phone] 12:48 PM  Lyanne Co, DPT Acute Rehabilitation Services [phone]   [name] [name] 05/19/2020, 10:07 AM

## 2020-05-19 NOTE — Procedures (Signed)
Ultrasound-guided diagnostic and therapeutic left thoracentesis performed yielding 1.3 liters of yellow fluid. No immediate complications. Follow-up chest x-ray pending.The fluid was sent to the lab for preordered studies. EBL none.

## 2020-05-19 NOTE — Progress Notes (Signed)
[facility] PROGRESS NOTE    Progress Note  Crusoe Oatley  [id number] DOB: [birth year] DOA: 05/16/2020 PCP: Janith Lima, MD     Brief Narrative:   Sophat Dubow is an 65 y.o. male past medical history of atrial fibrillation on Eliquis, uncontrolled insulin-dependent diabetes mellitus type 2, CAD status post CABG times 01/2001 came into the ED after falling approximately 17 stairs after extensive radiologic evaluation he was diagnosed with a left scapular fracture and coracoid fracture.  He was initially admitted to the trauma service and Dr. Lorin Mercy recommended nonoperative management.  Work-up is underway for bilateral pleural effusion Eliquis was held due to decreasing hemoglobin and effusions.  Assessment/Plan:   Bilateral pleural effusion: Continue to hold anticoagulation 2D echo showed EF of EF of 55%. Due to his dropping hemoglobin and being on anticoagulation thoracocentesis were performed the yield 1.3 L of fluid bites culture has been sent. Post procedure chest x-ray shows no pneumothorax.  Left scapular and coracoid fracture: Orthopedic surgery recommended conservative management with a sling follow-up with him in 2 weeks. Therapy evaluated the patient and recommended home health PT. Multiple bruises in his back.  CAD with a history of CABG in 2003: No anginal symptoms continue statins holding ACE inhibitor aspirin and Eliquis. Cardiology appointment has been scheduled for 05/26/2020.  Paroxysmal atrial fibrillation: Rate control holding Eliquis.  Lower extremity swelling: DVT.  Normocytic anemia: There was a drop in hemoglobin from 13-9.5 in the setting of Eliquis and trauma. Eliquis has been on hold. He has not had a bowel movement also on MiraLAX p.o. twice daily he relates no signs of overt bleeding. His back is bruised, his thoracocentesis did not show any blood. Currently on Protonix.  Poorly controlled diabetes mellitus type 2: With an  A1c of 12 he reported he stopped his Lantus due to nighttime low blood glucose. He was restarted on lower dose of Lantus with sliding scale continue current regimen.   DVT prophylaxis: scd Family Communication:mother Status is: Inpatient  Remains inpatient appropriate because:Hemodynamically unstable   Dispo: The patient is from: Home              Anticipated d/c is to: Home              Patient currently is not medically stable to d/c.   Difficult to place patient No        Code Status:     Code Status Orders  (From admission, onward)         Start     Ordered   05/16/20 1931  Full code  Continuous        05/16/20 1933        Code Status History    This patient has a current code status but no historical code status.   Advance Care Planning Activity        IV Access:    Peripheral IV   Procedures and diagnostic studies:   DG Chest 1 View  Result Date: 05/19/2020 CLINICAL DATA:  Status post left-sided thoracentesis. EXAM: CHEST  1 VIEW COMPARISON:  Chest CT 05/16/2020 FINDINGS: Interval evacuation of the left pleural effusion. No postprocedural pneumothorax. Suspect persistent layering right pleural effusion with hazy opacity of the right hemithorax. IMPRESSION: Interval evacuation of left pleural effusion without postprocedural pneumothorax. Electronically Signed   By: Marijo Sanes M.D.   On: 05/19/2020 10:40   ECHOCARDIOGRAM COMPLETE BUBBLE STUDY  Result Date: 05/17/2020    ECHOCARDIOGRAM REPORT  Patient Name:   KHALI ALBANESE Date of Exam: 05/17/2020 Medical Rec #:  [id number]            Height:       74.0 in Accession #:    [id number]           Weight:       175.0 lb Date of Birth:  Feb 06, 1955             BSA:          2.054 m Patient Age:    36 years             BP:           107/72 mmHg Patient Gender: M                    HR:           72 bpm. Exam Location:  Inpatient Procedure: 2D Echo, Cardiac Doppler, Color Doppler and Saline Contrast Bubble             Study Indications:    R06.02 SOB  History:        Patient no has prior history of Echocardiogram examinations.                 CAD, Signs/Symptoms:Shortness of Breath; Risk                 Factors:Hypertension, Dyslipidemia and Diabetes.  Sonographer:    Bernadene Person RDCS Referring Phys: [place]  Sonographer Comments: Pt unable to turn on left side. IMPRESSIONS  1. Left ventricular ejection fraction, by estimation, is 55 to 60%. The left ventricle has normal function. The left ventricle has no regional wall motion abnormalities. Left ventricular diastolic parameters are indeterminate.  2. Right ventricular systolic function is low normal. The right ventricular size is normal. There is mildly elevated pulmonary artery systolic pressure.  3. Moderate pleural effusion in the left lateral region.  4. The mitral valve is grossly normal. No evidence of mitral valve regurgitation.  5. The aortic valve is tricuspid. There is moderate thickening of the aortic valve. Aortic valve regurgitation is not visualized.  6. There is borderline dilatation of the aortic root, measuring 38 mm. There is borderline dilatation of the ascending aorta, measuring 39 mm. These are within normal limits for age and BSA.  7. Agitated saline contrast bubble study was negative, with no evidence of any interatrial shunt. Comparison(s): No prior Echocardiogram. FINDINGS  Left Ventricle: Left ventricular ejection fraction, by estimation, is 55 to 60%. The left ventricle has normal function. The left ventricle has no regional wall motion abnormalities. The left ventricular internal cavity size was normal in size. There is  no concentric left ventricular hypertrophy of the basal-septal segment. Left ventricular diastolic parameters are indeterminate. Right Ventricle: The right ventricular size is normal. No increase in right ventricular wall thickness. Right ventricular systolic function is low normal. There is mildly elevated  pulmonary artery systolic pressure. The tricuspid regurgitant velocity is 3.13 m/s, and with an assumed right atrial pressure of 3 mmHg, the estimated right ventricular systolic pressure is 35.5 mmHg. Left Atrium: Left atrial size was normal in size. Right Atrium: Right atrial size was normal in size. Pericardium: There is no evidence of pericardial effusion. Mitral Valve: The mitral valve is grossly normal. No evidence of mitral valve regurgitation. Tricuspid Valve: The tricuspid valve is not well visualized. Tricuspid valve regurgitation is not demonstrated. Aortic Valve: The  aortic valve is tricuspid. There is moderate thickening of the aortic valve. Aortic valve regurgitation is not visualized. Pulmonic Valve: Discrepancy betweeen color and Spectral Doppler. The pulmonic valve was normal in structure. Pulmonic valve regurgitation is not visualized. No evidence of pulmonic stenosis. Aorta: Aortic dilatation noted. There is borderline dilatation of the aortic root, measuring 38 mm. There is borderline dilatation of the ascending aorta, measuring 39 mm. IAS/Shunts: The atrial septum is grossly normal. Agitated saline contrast was given intravenously to evaluate for intracardiac shunting. Agitated saline contrast bubble study was negative, with no evidence of any interatrial shunt. Additional Comments: There is a moderate pleural effusion in the left lateral region.  LEFT VENTRICLE PLAX 2D LVIDd:         4.20 cm LVIDs:         2.40 cm LV PW:         2.50 cm LV IVS:        1.10 cm LVOT diam:     2.10 cm LV SV:         56 LV SV Index:   27 LVOT Area:     3.46 cm  RIGHT VENTRICLE TAPSE (M-mode): 1.0 cm LEFT ATRIUM           Index       RIGHT ATRIUM           Index LA diam:      4.00 cm 1.95 cm/m  RA Area:     13.20 cm LA Vol (A2C): 35.5 ml 17.29 ml/m RA Volume:   36.20 ml  17.63 ml/m LA Vol (A4C): 39.8 ml 19.38 ml/m  AORTIC VALVE LVOT Vmax:   67.20 cm/s LVOT Vmean:  55.700 cm/s LVOT VTI:    0.163 m  AORTA Ao Root  diam: 3.80 cm Ao Asc diam:  3.90 cm TRICUSPID VALVE TR Peak grad:   39.2 mmHg TR Vmax:        313.00 cm/s  SHUNTS Systemic VTI:  0.16 m Systemic Diam: 2.10 cm Rudean Haskell MD Electronically signed by Rudean Haskell MD Signature Date/Time: 05/17/2020/4:21:06 PM    Final      Medical Consultants:    None.   Subjective:    Laithan Loeffel relates his pain is controlled.  Objective:    Vitals:   05/18/20 1645 05/18/20 2130 05/19/20 0419 05/19/20 0420  BP: 115/64 127/68 121/73   Pulse: 60 61 66   Resp: 17 20 19    Temp: (!) 100.4 F (38 C) 99.8 F (37.7 C) 99.1 F (37.3 C)   TempSrc: Oral Oral Oral   SpO2: 93% 90% (!) 89% 95%  Weight:      Height:       SpO2: 95 % O2 Flow Rate (L/min): 2 L/min   Intake/Output Summary (Last 24 hours) at 05/19/2020 1051 Last data filed at 05/19/2020 0900 Gross per 24 hour  Intake 1191 ml  Output 2100 ml  Net -909 ml   Filed Weights   05/16/20 1003  Weight: 79.4 kg    Exam: General exam: In no acute distress. Respiratory system: Good air movement and clear to auscultation. Cardiovascular system: S1 & S2 heard, RRR. No JVD. Gastrointestinal system: Abdomen is nondistended, soft and nontender.  Extremities: No pedal edema. Skin: Bruises throughout his back. Psychiatry: Judgement and insight appear normal. Mood & affect appropriate.  Data Reviewed:    Labs: Basic Metabolic Panel: Recent Labs  Lab 05/16/20 1256 05/17/20 0457 05/18/20 0405  NA 134* 134* 133*  K 4.2 4.7 4.0  CL 101 100 100  CO2 27 25 28   GLUCOSE 407* 267* 117*  BUN 12 13 20   CREATININE 0.55* 0.71 0.77  CALCIUM 7.9* 8.4* 8.2*   GFR Estimated Creatinine Clearance: 100.6 mL/min (by C-G formula based on SCr of 0.77 mg/dL). Liver Function Tests: Recent Labs  Lab 05/17/20 0457 05/18/20 1405  AST 16 14*  ALT 12 12  ALKPHOS 45 42  BILITOT 1.1 0.7  PROT 4.6* 4.7*  ALBUMIN 2.7* 2.6*   No results for input(s): LIPASE, AMYLASE in the last  168 hours. No results for input(s): AMMONIA in the last 168 hours. Coagulation profile No results for input(s): INR, PROTIME in the last 168 hours. COVID-19 Labs  Recent Labs    05/16/20 04/10/26  FERRITIN 418*    Lab Results  Component Value Date   SARSCOV2NAA NEGATIVE 05/16/2020   SARSCOV2NAA NEGATIVE 12/07/2019   Shorter NEGATIVE 12/03/2019    CBC: Recent Labs  Lab 05/16/20 1256 05/17/20 0457 05/18/20 0405  WBC 7.3 5.1 5.8  HGB 10.3* 9.4* 8.9*  HCT 31.7* 29.0* 25.8*  MCV 99.1 100.0 95.6  PLT 152 156 150   Cardiac Enzymes: No results for input(s): CKTOTAL, CKMB, CKMBINDEX, TROPONINI in the last 168 hours. BNP (last 3 results) No results for input(s): PROBNP in the last 8760 hours. CBG: Recent Labs  Lab 05/18/20 0653 05/18/20 1215 05/18/20 1634 05/18/20 04/09/2117 05/19/20 0632  GLUCAP 155* 123* 125* 137* 120*   D-Dimer: No results for input(s): DDIMER in the last 72 hours. Hgb A1c: Recent Labs    05/16/20 04-10-2026  HGBA1C 12.7*   Lipid Profile: No results for input(s): CHOL, HDL, LDLCALC, TRIG, CHOLHDL, LDLDIRECT in the last 72 hours. Thyroid function studies: Recent Labs    05/16/20 04-10-2026  TSH 2.845   Anemia work up: Recent Labs    05/16/20 2026/04/10  VITAMINB12 337  FOLATE 26.0  FERRITIN 418*  TIBC 273  IRON 33*  RETICCTPCT 2.8   Sepsis Labs: Recent Labs  Lab 05/16/20 1256 05/17/20 0457 05/18/20 0405  WBC 7.3 5.1 5.8   Microbiology Recent Results (from the past 240 hour(s))  Resp Panel by RT-PCR (Flu A&B, Covid) Nasopharyngeal Swab     Status: None   Collection Time: 05/16/20  1:06 PM   Specimen: Nasopharyngeal Swab; Nasopharyngeal(NP) swabs in vial transport medium  Result Value Ref Range Status   SARS Coronavirus 2 by RT PCR NEGATIVE NEGATIVE Final    Comment: (NOTE) SARS-CoV-2 target nucleic acids are NOT DETECTED.  The SARS-CoV-2 RNA is generally detectable in upper respiratory specimens during the acute phase of infection. The  lowest concentration of SARS-CoV-2 viral copies this assay can detect is 138 copies/mL. A negative result does not preclude SARS-Cov-2 infection and should not be used as the sole basis for treatment or other patient management decisions. A negative result may occur with  improper specimen collection/handling, submission of specimen other than nasopharyngeal swab, presence of viral mutation(s) within the areas targeted by this assay, and inadequate number of viral copies(<138 copies/mL). A negative result must be combined with clinical observations, patient history, and epidemiological information. The expected result is Negative.  Fact Sheet for Patients:  EntrepreneurPulse.com.au  Fact Sheet for Healthcare Providers:  IncredibleEmployment.be  This test is no t yet approved or cleared by the [place] FDA and  has been authorized for detection and/or diagnosis of SARS-CoV-2 by FDA under an Emergency Use Authorization (EUA). This EUA will remain  in effect (meaning this  test can be used) for the duration of the COVID-19 declaration under Section 564(b)(1) of the Act, 21 U.S.C.section 360bbb-3(b)(1), unless the authorization is terminated  or revoked sooner.       Influenza A by PCR NEGATIVE NEGATIVE Final   Influenza B by PCR NEGATIVE NEGATIVE Final    Comment: (NOTE) The Xpert Xpress SARS-CoV-2/FLU/RSV plus assay is intended as an aid in the diagnosis of influenza from Nasopharyngeal swab specimens and should not be used as a sole basis for treatment. Nasal washings and aspirates are unacceptable for Xpert Xpress SARS-CoV-2/FLU/RSV testing.  Fact Sheet for Patients: EntrepreneurPulse.com.au  Fact Sheet for Healthcare Providers: IncredibleEmployment.be  This test is not yet approved or cleared by the [place] FDA and has been authorized for detection and/or diagnosis of SARS-CoV-2 by FDA under  an Emergency Use Authorization (EUA). This EUA will remain in effect (meaning this test can be used) for the duration of the COVID-19 declaration under Section 564(b)(1) of the Act, 21 U.S.C. section 360bbb-3(b)(1), unless the authorization is terminated or revoked.  Performed at [facility], [address] [place], [place] [address]      Medications:   . atorvastatin  40 mg Oral Daily  . insulin aspart  0-15 Units Subcutaneous TID WC  . insulin aspart  0-5 Units Subcutaneous QHS  . insulin aspart  5 Units Subcutaneous TID WC  . insulin glargine  20 Units Subcutaneous Daily  . lidocaine (PF)      . mirtazapine  15 mg Oral QHS  . pantoprazole  40 mg Oral Daily  . PARoxetine  20 mg Oral Daily  . polyethylene glycol  17 g Oral BID  . sodium chloride flush  3 mL Intravenous Q12H  . traZODone  25 mg Oral QHS   Continuous Infusions: . sodium chloride        LOS: 3 days   Charlynne Cousins  [facility]  05/19/2020, 10:51 AM

## 2020-05-19 NOTE — Progress Notes (Signed)
Successfully wean off from oxygen.No sob noted,roo air saturation above 90%.

## 2020-05-20 ENCOUNTER — Inpatient Hospital Stay (HOSPITAL_COMMUNITY): Payer: HMO

## 2020-05-20 LAB — PATHOLOGIST SMEAR REVIEW

## 2020-05-20 LAB — GLUCOSE, CAPILLARY
Glucose-Capillary: 96 mg/dL (ref 70–99)
Glucose-Capillary: 192 mg/dL — ABNORMAL HIGH (ref 70–99)
Glucose-Capillary: 174 mg/dL — ABNORMAL HIGH (ref 70–99)
Glucose-Capillary: 233 mg/dL — ABNORMAL HIGH (ref 70–99)

## 2020-05-20 MED ORDER — SODIUM CHLORIDE 0.9 % IV SOLN
1.5000 g | Freq: Four times a day (QID) | INTRAVENOUS | Status: DC
  Administered 2020-05-20 – 2020-05-24 (×18): 1.5 g via INTRAVENOUS
  Filled 2020-05-20 (×8): qty 4
  Filled 2020-05-20: qty 1.5
  Filled 2020-05-20 (×10): qty 4

## 2020-05-20 MED ORDER — OXYCODONE HCL 5 MG PO TABS
10.0000 mg | ORAL_TABLET | ORAL | Status: AC | PRN
  Administered 2020-05-20 – 2020-05-22 (×4): 10 mg via ORAL
  Filled 2020-05-20 (×4): qty 2

## 2020-05-20 NOTE — Progress Notes (Signed)
Occupational Therapy Treatment Patient Details Name: [name] [name] MRN: [id number] DOB: [birth year] Today's Date: 05/20/2020    History of present illness [name] [name] is a 65 y.o. male seen for pleural effusion with hypoxia, as well as a fall resulting in L closed scapular fx, to be managed with a sling, per Ortho   OT comments  Pt making slow progress towards acute OT goals. Requires +2 physical assist to stand and take pivotal steps. Pt found to be incontinent of urine, gown soaked. Pt unaware he was wet and was perplexed but unconcerned "I can just leave my briefs on. It's fine." Lengthy discussion with pt while sitting EOB attempting transfers to help him realize he does not have a viable plan for d/c home. Spoke with son(POA)  on the phone who confirms the pt will have little to no help at home and also recognizes going home is not a safe plan for pt. At this point, question pt's capacity to make an informed decision. Encouraged pt to talk to his father about ST SNF.    Follow Up Recommendations  SNF    Equipment Recommendations  Other (comment) (defer to next venue.)    Recommendations for Other Services      Precautions / Restrictions Precautions Precautions: Fall Required Braces or Orthoses: Sling Restrictions Weight Bearing Restrictions: Yes LUE Weight Bearing: Non weight bearing       Mobility Bed Mobility Overal bed mobility: Needs Assistance Bed Mobility: Supine to Sit     Supine to sit: Mod assist;+2 for safety/equipment;Max assist;HOB elevated     General bed mobility comments: Multimodal cues for initiation and sequencing movements. Physicall assist to pwer up trunk and pivot hips to full EOB position.    Transfers Overall transfer level: Needs assistance Equipment used: 2 person hand held assist Transfers: Sit to/from [facility] Sit to Stand: Mod assist;+2 safety/equipment Stand pivot transfers: Mod assist;+2 physical  assistance;+2 safety/equipment       General transfer comment: Cues for intitation and sequencing. Required significantly elevated bed height and +2 assist. Attempted to sit prematurely and max cueing to wait until safe to sit.    Balance Overall balance assessment: Needs assistance   Sitting balance-Leahy Scale: Poor Sitting balance - Comments: RUE used to support self in sitting                                   ADL either performed or assessed with clinical judgement   ADL Overall ADL's : Needs assistance/impaired                         Toilet Transfer: Minimal assistance;Moderate assistance;+2 for physical assistance;+2 for safety/equipment;BSC Toilet Transfer Details (indicate cue type and reason): pivotal steps from very elevated EOB to recliner boosted with pillow. Pt initating sitting prematurely Toileting- Clothing Manipulation and Hygiene: Maximal assistance;Sitting/lateral lean;Sit to/from stand;+2 for safety/equipment         General ADL Comments: Impaired cognition, decreased awareness, problem solving,insight into current deficits, weakness, decreased activity tolerance continue to impact safety and ability to complete ADLs.     Vision       Perception     Praxis      Cognition Arousal/Alertness: Awake/alert Behavior During Therapy: Flat affect Overall Cognitive Status: Impaired/Different from baseline Area of Impairment: Attention;Memory;Following commands;Safety/judgement;Awareness;Problem solving  Current Attention Level: Sustained Memory: Decreased short-term memory;Decreased recall of precautions Following Commands: Follows one step commands with increased time Safety/Judgement: Decreased awareness of safety;Decreased awareness of deficits Awareness: Intellectual Problem Solving: Slow processing;Decreased initiation;Difficulty sequencing;Requires verbal cues;Requires tactile cues General Comments: Pt  found to be soaked in urine at start of session but completely unaware. Pt seemed perplexed but unconcerned. He stated he could just keep wearing his briefs, discusses with pt why that was not a viable option. Pt continues to insist he can walk once he gets home in direct contrast to the reality of his current situation. Attempted to talk this through while trying sit<>stand transfer. Pt stating multiple times, "But I could walk on the day I was injured, why can't I walk now?" Each time pt reoriented to situation and acute deficts including onset of pnemonia per chart review.        Exercises     Shoulder Instructions       General Comments spoke with son, [name] [facility]) after session and encouraged his to discuss rehab at SNF with pt. Son is  very concerned about pt going home as the pt will have no assist.    Pertinent Vitals/ Pain       Pain Assessment: Faces Faces Pain Scale: Hurts little more Pain Location: L shoulder/scapula, L knee Pain Descriptors / Indicators: Grimacing;Guarding;Constant Pain Intervention(s): Premedicated before session;Monitored during session;Limited activity within patient's tolerance;Repositioned  Home Living Family/patient expects to be discharged to:: Private residence Living Arrangements: Children;Other (Comment) Available Help at Discharge: Other (Comment) (no assist for basic ADLs available at home)                                    Prior Functioning/Environment              Frequency  Min 3X/week        Progress Toward Goals  OT Goals(current goals can now be found in the care plan section)  Progress towards OT goals: Progressing toward goals (slow progression)  Acute Rehab OT Goals Patient Stated Goal: pt: home! son agreeable to SNF OT Goal Formulation: With patient/family Time For Goal Achievement: 06/01/20 Potential to Achieve Goals: Good ADL Goals Pt Will Perform Upper Body Dressing: sitting;with min guard  assist;with set-up Pt Will Perform Lower Body Dressing: sitting/lateral leans;with min assist Pt Will Transfer to Toilet: with min guard assist;squat pivot transfer;with transfer board;bedside commode Pt Will Perform Toileting - Clothing Manipulation and hygiene: with set-up;with min guard assist;sitting/lateral leans Additional ADL Goal #1: Pt and caregiver will be independent with donning/doffing LUE sling and sling wearing schedule.  Plan Discharge plan remains appropriate    Co-evaluation                 AM-PAC OT "6 Clicks" Daily Activity     Outcome Measure   Help from another person eating meals?: A Little Help from another person taking care of personal grooming?: A Lot Help from another person toileting, which includes using toliet, bedpan, or urinal?: Total Help from another person bathing (including washing, rinsing, drying)?: Total Help from another person to put on and taking off regular upper body clothing?: A Lot Help from another person to put on and taking off regular lower body clothing?: Total 6 Click Score: 10    End of Session Equipment Utilized During Treatment: Gait belt;Other (comment) (sling)  OT Visit Diagnosis: Unsteadiness on feet (R26.81);Other abnormalities  of gait and mobility (R26.89);Muscle weakness (generalized) (M62.81);History of falling (Z91.81);Other symptoms and signs involving cognitive function;Cognitive communication deficit (R41.841);Pain   Activity Tolerance Patient limited by fatigue   Patient Left in chair;with call bell/phone within reach;with chair alarm set   Nurse Communication Other (comment) (timed therapy with pain med)        Time: 0347-4259 OT Time Calculation (min): 54 min  Charges: OT General Charges $OT Visit: 1 Visit OT Treatments $Self Care/Home Management : 8-22 mins $Therapeutic Activity: 8-22 mins  Tyrone Schimke, OT Acute Rehabilitation Services Pager: [phone] Office:  [place], [place] 05/20/2020, 11:17 AM

## 2020-05-20 NOTE — Care Management Important Message (Signed)
Important Message  Patient Details  Name: [name] [name] MRN: [id number] Date of Birth: 12/31/1955   Medicare Important Message Given:  Yes - Important Message mailed due to current National Emergency   Verbal consent obtained due to current National Emergency  Relationship to patient: Mother Contact Name: Mickel Baas Call Date: 05/20/20  Time: 1046 Phone: [phone] Outcome: Spoke with contact Important Message mailed to: Patient address on file    [name] 05/20/2020, 10:48 AM

## 2020-05-20 NOTE — Progress Notes (Signed)
Discussed non-op management of left scapula fx's with pt again. He can follow up with me in 1 to 2 wks in my office.

## 2020-05-20 NOTE — Progress Notes (Signed)
Physical Therapy Treatment Patient Details Name: [name] [name] MRN: [id number] DOB: [birth year] Today's Date: 05/20/2020    History of Present Illness [name] [name] is a 65 y.o. male seen for pleural effusion with hypoxia, as well as a fall resulting in L closed scapular fx, to be managed with a sling, per Ortho    PT Comments    Pt premedicated as requested, however reluctant to work with therapy. With encouragement and education on PNA improvement with upright posture and activity. Pt ultimately agrees to work with therapy as he is hopeful for discharge home tomorrow. When covers removed pt found to be saturated in urine and reports he is unaware. With getting ready to clean up pt reports this is his last pair of underware so he will have to leave them on. Pt then requires modAx2 for coming to the Eo[facility]. PT/OT provided multiple attempts at standing from bed positioned at chair height, initially with no assist and progressing to total A and still unable to achieve upright. Pt requires maximal elevation of bed and modAx2. Pt able to take pivotal steps to chair and march in place but unsafe to move away from recliner. Educated pt on need for PTAR transfer home if he is going home as he is not able to mobilize enough for car transfer home. OT has spoken with son and son will come and speak to pt about need for rehab at [facility] before coming home. Although PT will continue to follow pt acutely, he will not be able to be rehabbed in Acute Care setting to level for safe return home in the short term given increased pain and decreased strength.       Follow Up Recommendations  SNF;Supervision for mobility/OOB     Equipment Recommendations  Wheelchair (measurements PT);Wheelchair cushion (measurements PT)       Precautions / Restrictions Precautions Precautions: Fall Required Braces or Orthoses: Sling Restrictions Weight Bearing Restrictions: Yes LUE Weight Bearing: Non weight bearing     Mobility  Bed Mobility Overal bed mobility: Needs Assistance Bed Mobility: Supine to Sit     Supine to sit: Mod assist;+2 for safety/equipment;Max assist;HOB elevated     General bed mobility comments: Multimodal cues for initiation and sequencing movements. Physicall assist to pwer up trunk and pivot hips to full EOB position.    Transfers Overall transfer level: Needs assistance Equipment used: 2 person hand held assist Transfers: Sit to/from [facility] Sit to Stand: Mod assist;+2 safety/equipment Stand pivot transfers: Mod assist;+2 physical assistance;+2 safety/equipment       General transfer comment: Cues for intitation and sequencing. Required significantly elevated bed height and +2 assist. Attempted to sit prematurely and max cueing to wait until safe to sit.  Ambulation/Gait             General Gait Details: only able to march in place safely       Balance Overall balance assessment: Needs assistance   Sitting balance-Leahy Scale: Poor Sitting balance - Comments: RUE used to support self in sitting   Standing balance support: Bilateral upper extremity supported Standing balance-Leahy Scale: Poor Standing balance comment: modAx2 for safety                            Cognition Arousal/Alertness: Awake/alert Behavior During Therapy: Flat affect Overall Cognitive Status: Impaired/Different from baseline Area of Impairment: Attention;Memory;Following commands;Safety/judgement;Awareness;Problem solving  Current Attention Level: Sustained Memory: Decreased short-term memory;Decreased recall of precautions Following Commands: Follows one step commands with increased time Safety/Judgement: Decreased awareness of safety;Decreased awareness of deficits Awareness: Intellectual Problem Solving: Slow processing;Decreased initiation;Difficulty sequencing;Requires verbal cues;Requires tactile cues General  Comments: Pt found to be soaked in urine at start of session but completely unaware. Pt seemed perplexed but unconcerned. He stated he could just keep wearing his briefs, discusses with pt why that was not a viable option. Pt continues to insist he can walk once he gets home in direct contrast to the reality of his current situation. Attempted to talk this through while trying sit<>stand transfer. Pt stating multiple times, "But I could walk on the day I was injured, why can't I walk now?" Each time pt reoriented to situation and acute deficts including onset of pnemonia per chart review.      Exercises General Exercises - Lower Extremity Hip Flexion/Marching: AROM;Both;10 reps;Standing    General Comments General comments (skin integrity, edema, etc.): Pt with very poor insight into his current deficits and safety.      Pertinent Vitals/Pain Pain Assessment: Faces Faces Pain Scale: Hurts little more Pain Location: L shoulder/scapula, L knee Pain Descriptors / Indicators: Grimacing;Guarding;Constant Pain Intervention(s): Limited activity within patient's tolerance;Monitored during session;Repositioned;Premedicated before session    Home Living Family/patient expects to be discharged to:: Private residence Living Arrangements: Children;Other (Comment) Available Help at Discharge: Other (Comment) (no assist for basic ADLs available at home)                Prior Function            PT Goals (current goals can now be found in the care plan section) Acute Rehab PT Goals Patient Stated Goal: pt: home! son agreeable to SNF PT Goal Formulation: With patient Time For Goal Achievement: 05/31/20 Potential to Achieve Goals: Fair Progress towards PT goals: Not progressing toward goals - comment (pt strength has decreased over weak)    Frequency    Min 2X/week      PT Plan Discharge plan needs to be updated;Frequency needs to be updated    Co-evaluation PT/OT/SLP  Co-Evaluation/Treatment: Yes Reason for Co-Treatment: For patient/therapist safety PT goals addressed during session: Mobility/safety with mobility;Balance;Strengthening/ROM        AM-PAC PT "6 Clicks" Mobility   Outcome Measure  Help needed turning from your back to your side while in a flat bed without using bedrails?: A Little Help needed moving from lying on your back to sitting on the side of a flat bed without using bedrails?: A Lot Help needed moving to and from a bed to a chair (including a wheelchair)?: A Lot Help needed standing up from a chair using your arms (e.g., wheelchair or bedside chair)?: A Lot Help needed to walk in hospital room?: A Lot Help needed climbing 3-5 steps with a railing? : Total 6 Click Score: 12    End of Session Equipment Utilized During Treatment: Gait belt;Other (comment) (utilized sling for transfer request removal in sitting) Activity Tolerance: Patient limited by pain;Patient limited by fatigue Patient left: in bed;with call bell/phone within reach;with bed alarm set Nurse Communication: Mobility status;Need for lift equipment;Other (comment) (need for lift pad) PT Visit Diagnosis: Unsteadiness on feet (R26.81);Other abnormalities of gait and mobility (R26.89);Repeated falls (R29.6);Muscle weakness (generalized) (M62.81);History of falling (Z91.81);Difficulty in walking, not elsewhere classified (R26.2);Pain Pain - Right/Left: Left Pain - part of body: Shoulder     Time: 0240-9735 PT Time Calculation (min) (ACUTE  ONLY): 47 min  Charges:  $Therapeutic Activity: 23-37 mins                     Analese Sovine B. Migdalia Dk PT, DPT Acute Rehabilitation Services Pager [phone] Office [phone]    [place] 05/20/2020, 11:59 AM

## 2020-05-20 NOTE — TOC Initial Note (Addendum)
Transition of Care [facility]) - Initial/Assessment Note    Patient Details  Name: [name] [name] MRN: [id number] Date of Birth: 24-Dec-1955  [facility] CM/SW Contact:    Verdell Carmine, RN Phone Number: 05/20/2020, 11:15 AM  Clinical Narrative:                 Patient in with fall on blood thinners scalpular fracture. He lives with his 38 ear old mother. Has a ground floor bedroom but has to climb steps to get to bathroom. PT recommending SNF, patient refusing.  Will need DME and Full HH services.  [place] to son Merrily Pew. He is unable to be with hte patient 75 /7. His grandmother is unable to help the patient. The patient is requiring two people to assist him getting up at this time. Went over scenarios of SNF, [place] services, if patient changes his mind, how PCP is involved and social work. SO [name] will come in to day and talk to his father about snf, as he feels it will be unsafe without supervision at home.  1300: received a call from [place], patient son that he has agreed to SNF placement. [name] would like to make sure he is in the decision making process, and to be called if his dad is changing his mind or disputing SNF, but he states he does agree to SNF placement. Notified team so the process can move forward. He also stated he wants [place] to reiterate it is only for a short stay as a bridge back home. Patient is vaccinated for COVID and boosted with Pfizer vaccine Expected Discharge Plan: Skilled Nursing Facility Barriers to Discharge: Continued Medical Work up   Patient Goals and CMS Choice        Expected Discharge Plan and Services Expected Discharge Plan: [place] In-house Referral: Clinical Social Work Discharge Planning Services: CM Consult   Living arrangements for the past 2 months: [place]                                      Prior Living Arrangements/Services Living arrangements for the past 2 months: Single Family  Home Lives with:: Parents Patient language and need for interpreter reviewed:: Yes        Need for Family Participation in Patient Care: Yes (Comment) Care giver support system in place?: Yes (comment)   Criminal Activity/Legal Involvement Pertinent to Current Situation/Hospitalization: No - Comment as needed  Activities of Daily Living Home Assistive Devices/Equipment: Cane (specify quad or straight) ADL Screening (condition at time of admission) Patient's cognitive ability adequate to safely complete daily activities?: Yes Is the patient deaf or have difficulty hearing?: Yes Does the patient have difficulty seeing, even when wearing glasses/contacts?: No Does the patient have difficulty concentrating, remembering, or making decisions?: No Patient able to express need for assistance with ADLs?: Yes Does the patient have difficulty dressing or bathing?: Yes Independently performs ADLs?: Yes (appropriate for developmental age) Does the patient have difficulty walking or climbing stairs?: No Weakness of Legs: None Weakness of Arms/Hands: Left  Permission Sought/Granted                  Emotional Assessment       Orientation: : Oriented to Place,Oriented to Self Alcohol / Substance Use: Not Applicable Psych Involvement: No (comment)  Admission diagnosis:  Trauma [T14.90XA] Hyperglycemia [R73.9] Closed displaced fracture of  coracoid process of left shoulder, initial encounter [S42.132A] Fall (on) (from) other stairs and steps, initial encounter [W10.8XXA] Closed fracture of left scapula, unspecified part of scapula, initial encounter [S42.102A] Hypoxia [R09.02] Patient Active Problem List   Diagnosis Date Noted  . Fall (on) (from) other stairs and steps, initial encounter 05/16/2020  . Closed left scapular fracture 05/16/2020  . Hypoxia 05/16/2020  . BPH with obstruction/lower urinary tract symptoms 03/16/2020  . PAF (paroxysmal atrial fibrillation) ([place]) 03/16/2020  .  Viral wart on finger 03/16/2020  . Type 1 diabetes mellitus with diabetic polyneuropathy ([place]) 01/27/2020  . Anemia 01/13/2020  . Encounter for general adult medical examination with abnormal findings 01/13/2020  . Hyperlipidemia LDL goal <70 01/13/2020  . Benign prostatic hyperplasia without lower urinary tract symptoms 01/13/2020  . Claudication of both lower extremities ([place]) 01/13/2020  . Coronary artery disease involving bypass graft of transplanted heart without angina pectoris 01/13/2020  . Paroxysmal atrial fibrillation ([place]) 01/13/2020  . Recurrent major depressive disorder, in partial remission ([place]) 01/13/2020  . Aortic dissection ([place]) 02/05/2019  . Coronary artery disease 02/05/2019  . Hyperlipidemia 02/05/2019  . Hypertension 02/05/2019  . Psoriasis 02/05/2019  . Vitamin D deficiency 01/07/2019  . Uncontrolled type 2 diabetes mellitus with hyperglycemia ([place]) 01/07/2019  . Microalbuminuria 01/07/2019  . Depression 06/10/2018  . DKA (diabetic ketoacidoses) 06/10/2018  . Non-intractable vomiting 06/10/2018  . Patellar tendonitis of right knee 04/24/2018  . Ulnar neuropathy of left upper extremity 07/12/2017  . Peyronie's disease 06/09/2015  . Erectile dysfunction 03/15/2015  . S/P aortic aneurysm repair 05/19/2012  . S/P CABG (coronary artery bypass graft) 05/19/2012  . Non-proliferative diabetic retinopathy ([place]) 02/27/2011  . Contracture of hand joint 11/01/2010  . Dupuytren's contracture of both hands 11/01/2010   PCP:  Janith Lima, MD Pharmacy:   [facility] [address], [place] [address] Phone: [phone] Fax: [phone]     Social Determinants of Health (SDOH) Interventions    Readmission Risk Interventions No flowsheet data found.

## 2020-05-20 NOTE — Progress Notes (Signed)
[facility] PROGRESS NOTE    Progress Note  [name] [name]  [id number] DOB: Aug 25, 1955 DOA: 05/16/2020 PCP: Janith Lima, MD     Brief Narrative:   [name] [name] is an 65 y.o. male past medical history of atrial fibrillation on Eliquis, uncontrolled insulin-dependent diabetes mellitus type 2, CAD status post CABG times 01/2001 came into the ED after falling approximately 17 stairs after extensive radiologic evaluation he was diagnosed with a left scapular fracture and coracoid fracture.  He was initially admitted to the trauma service and Dr. Lorin Mercy recommended nonoperative management.  Work-up is underway for bilateral pleural effusion Eliquis was held due to decreasing hemoglobin and effusions.  Assessment/Plan:   Bilateral pleural effusion: Continue to hold anticoagulation 2D echo showed EF of EF of 55%. Due to his dropping hemoglobin and being on anticoagulation thoracocentesis were performed the yield 1.3 L that showed no signs of bleeding or infection.  Possible aspiration pneumonia: He started developing fever chest x-ray was done that showed new lower lobe bilateral infiltrates he was started on IV Unasyn continue to monitor fever curve. Monitor for an additional 24 hours if remains stable can be changed to Augmentin and discharged home.  Left scapular and coracoid fracture: Orthopedic surgery recommended conservative management with a sling follow-up with him in 2 weeks. Therapy evaluated the patient and recommended home health PT. Multiple bruises in his back.  CAD with a history of CABG in 2003: No anginal symptoms continue statins holding ACE inhibitor aspirin and Eliquis. Cardiology appointment has been scheduled for 05/26/2020.  Paroxysmal atrial fibrillation: Rate control holding Eliquis.  Lower extremity swelling: DVT.  Normocytic anemia: There was a drop in hemoglobin from 13-9.5 in the setting of Eliquis and trauma. Eliquis has been on  hold. He has not had any bloody bowel movement.  His back is all bruised. Thoracocentesis did not yield a hemothorax, CT of the chest on admission did not show any rib fractures. Continue Protonix his hemoglobin has remained stable for the last 48 hours  Poorly controlled diabetes mellitus type 2: With an A1c of 12 he reported he stopped his Lantus due to nighttime low blood glucose. He was restarted on lower dose of Lantus with sliding scale continue current regimen.   DVT prophylaxis: scd Family Communication:mother Status is: Inpatient  Remains inpatient appropriate because:Hemodynamically unstable   Dispo: The patient is from: Home              Anticipated d/c is to: Home              Patient currently is not medically stable to d/c.   Difficult to place patient No        Code Status:     Code Status Orders  (From admission, onward)         Start     Ordered   05/16/20 1931  Full code  Continuous        05/16/20 1933        Code Status History    This patient has a current code status but no historical code status.   Advance Care Planning Activity        IV Access:    Peripheral IV   Procedures and diagnostic studies:   DG Chest 1 View  Result Date: 05/19/2020 CLINICAL DATA:  Status post left-sided thoracentesis. EXAM: CHEST  1 VIEW COMPARISON:  Chest CT 05/16/2020 FINDINGS: Interval evacuation of the left pleural effusion. No postprocedural pneumothorax. Suspect persistent layering  right pleural effusion with hazy opacity of the right hemithorax. IMPRESSION: Interval evacuation of left pleural effusion without postprocedural pneumothorax. Electronically Signed   By: Marijo Sanes M.D.   On: 05/19/2020 10:40   DG Chest Port 1 View  Result Date: 05/20/2020 CLINICAL DATA:  Fever, fall EXAM: PORTABLE CHEST 1 VIEW COMPARISON:  05/19/2020 FINDINGS: Pulmonary insufflation is stable. Moderate bibasilar pulmonary infiltrate has developed, infection versus  aspiration. Small right pleural effusion is suspected. No pneumothorax. Coronary artery bypass grafting has been performed. Cardiac size within normal limits. Pulmonary vascularity is normal. No acute bone abnormality. IMPRESSION: Interval development of moderate bibasilar pulmonary infiltrate, infection versus aspiration. Associated small right parapneumonic effusion. Electronically Signed   By: Fidela Salisbury MD   On: 05/20/2020 06:58   IR THORACENTESIS ASP PLEURAL SPACE W/IMG GUIDE  Result Date: 05/19/2020 INDICATION: Patient with history of atrial fibrillation on Eliquis, diabetes, coronary artery disease with prior CABG, recent fall at home with left scapular fracture and coracoid fracture, bilateral pleural effusions; request received for diagnostic and therapeutic left thoracentesis. EXAM: ULTRASOUND GUIDED DIAGNOSTIC AND THERAPEUTIC LEFT  THORACENTESIS MEDICATIONS: 1% lidocaine to skin/SQ tissue COMPLICATIONS: None immediate. PROCEDURE: An ultrasound guided thoracentesis was thoroughly discussed with the patient and questions answered. The benefits, risks, alternatives and complications were also discussed. The patient understands and wishes to proceed with the procedure. Written consent was obtained. Ultrasound was performed to localize and mark an adequate pocket of fluid in the left chest. The area was then prepped and draped in the normal sterile fashion. 1% Lidocaine was used for local anesthesia. Under ultrasound guidance a 6 Fr Safe-T-Centesis catheter was introduced. Thoracentesis was performed. The catheter was removed and a dressing applied. FINDINGS: A total of approximately 1.3 liters of yellow fluid was removed. Samples were sent to the laboratory as requested by the clinical team. IMPRESSION: Successful ultrasound guided diagnostic and therapeutic left thoracentesis yielding 1.3 liters of pleural fluid. Read by: Rowe Robert, PA-C Electronically Signed   By: Corrie Mckusick D.O.   On:  05/19/2020 10:05     Medical Consultants:    None.   Subjective:    [name] [name] relates his pain is controlled has not had a bowel movement.  Objective:    Vitals:   05/19/20 2128 05/20/20 0000 05/20/20 0458 05/20/20 0900  BP: 129/74  125/75 120/71  Pulse: 66  60 (!) 53  Resp: 18  17 18   Temp: (!) 101.2 F (38.4 C) 99.5 F (37.5 C) 99 F (37.2 C) 98.9 F (37.2 C)  TempSrc: Oral Oral Oral Oral  SpO2: 93%  100% 95%  Weight: 79.4 kg     Height:       SpO2: 95 % O2 Flow Rate (L/min): 2 L/min   Intake/Output Summary (Last 24 hours) at 05/20/2020 0913 Last data filed at 05/20/2020 0902 Gross per 24 hour  Intake 720 ml  Output 2050 ml  Net -1330 ml   Filed Weights   05/16/20 1003 05/19/20 2128  Weight: 79.4 kg 79.4 kg    Exam: General exam: In no acute distress. Respiratory system: Good air movement and clear to auscultation. Cardiovascular system: S1 & S2 heard, RRR. No JVD. Gastrointestinal system: Abdomen is nondistended, soft and nontender.  Extremities: No pedal edema. Skin: No rashes, lesions or ulcers Psychiatry: Judgement and insight appear normal. Mood & affect appropriate.  Data Reviewed:    Labs: Basic Metabolic Panel: Recent Labs  Lab 05/16/20 1256 05/17/20 0457 05/18/20 0405  NA 134*  134* 133*  K 4.2 4.7 4.0  CL 101 100 100  CO2 27 25 28   GLUCOSE 407* 267* 117*  BUN 12 13 20   CREATININE 0.55* 0.71 0.77  CALCIUM 7.9* 8.4* 8.2*   GFR Estimated Creatinine Clearance: 100.6 mL/min (by C-G formula based on SCr of 0.77 mg/dL). Liver Function Tests: Recent Labs  Lab 05/17/20 0457 05/18/20 1405  AST 16 14*  ALT 12 12  ALKPHOS 45 42  BILITOT 1.1 0.7  PROT 4.6* 4.7*  ALBUMIN 2.7* 2.6*   No results for input(s): LIPASE, AMYLASE in the last 168 hours. No results for input(s): AMMONIA in the last 168 hours. Coagulation profile No results for input(s): INR, PROTIME in the last 168 hours. COVID-19 Labs  No results for  input(s): DDIMER, FERRITIN, LDH, CRP in the last 72 hours.  Lab Results  Component Value Date   SARSCOV2NAA NEGATIVE 05/16/2020   SARSCOV2NAA NEGATIVE 12/07/2019   [place] NEGATIVE 12/03/2019    CBC: Recent Labs  Lab 05/16/20 1256 05/17/20 0457 05/18/20 0405 05/19/20 1108  WBC 7.3 5.1 5.8 5.9  HGB 10.3* 9.4* 8.9* 8.7*  HCT 31.7* 29.0* 25.8* 25.6*  MCV 99.1 100.0 95.6 96.2  PLT 152 156 150 168   Cardiac Enzymes: No results for input(s): CKTOTAL, CKMB, CKMBINDEX, TROPONINI in the last 168 hours. BNP (last 3 results) No results for input(s): PROBNP in the last 8760 hours. CBG: Recent Labs  Lab 05/19/20 0632 05/19/20 1154 05/19/20 1649 05/19/20 2129 05/20/20 0639  GLUCAP 120* 144* 199* 183* 96   D-Dimer: No results for input(s): DDIMER in the last 72 hours. Hgb A1c: No results for input(s): HGBA1C in the last 72 hours. Lipid Profile: No results for input(s): CHOL, HDL, LDLCALC, TRIG, CHOLHDL, LDLDIRECT in the last 72 hours. Thyroid function studies: No results for input(s): TSH, T4TOTAL, T3FREE, THYROIDAB in the last 72 hours.  Invalid input(s): FREET3 Anemia work up: No results for input(s): VITAMINB12, FOLATE, FERRITIN, TIBC, IRON, RETICCTPCT in the last 72 hours. Sepsis Labs: Recent Labs  Lab 05/16/20 1256 05/17/20 0457 05/18/20 0405 05/19/20 1108  WBC 7.3 5.1 5.8 5.9   Microbiology Recent Results (from the past 240 hour(s))  Resp Panel by RT-PCR (Flu A&B, Covid) Nasopharyngeal Swab     Status: None   Collection Time: 05/16/20  1:06 PM   Specimen: Nasopharyngeal Swab; Nasopharyngeal(NP) swabs in vial transport medium  Result Value Ref Range Status   SARS Coronavirus 2 by RT PCR NEGATIVE NEGATIVE Final    Comment: (NOTE) SARS-CoV-2 target nucleic acids are NOT DETECTED.  The SARS-CoV-2 RNA is generally detectable in upper respiratory specimens during the acute phase of infection. The lowest concentration of SARS-CoV-2 viral copies this assay can  detect is 138 copies/mL. A negative result does not preclude SARS-Cov-2 infection and should not be used as the sole basis for treatment or other patient management decisions. A negative result may occur with  improper specimen collection/handling, submission of specimen other than nasopharyngeal swab, presence of viral mutation(s) within the areas targeted by this assay, and inadequate number of viral copies(<138 copies/mL). A negative result must be combined with clinical observations, patient history, and epidemiological information. The expected result is Negative.  Fact Sheet for Patients:  EntrepreneurPulse.com.au  Fact Sheet for Healthcare Providers:  IncredibleEmployment.be  This test is no t yet approved or cleared by the [place] FDA and  has been authorized for detection and/or diagnosis of SARS-CoV-2 by FDA under an Emergency Use Authorization (EUA). This EUA will remain  in effect (meaning this test can be used) for the duration of the COVID-19 declaration under Section 564(b)(1) of the Act, 21 U.S.C.section 360bbb-3(b)(1), unless the authorization is terminated  or revoked sooner.       Influenza A by PCR NEGATIVE NEGATIVE Final   Influenza B by PCR NEGATIVE NEGATIVE Final    Comment: (NOTE) The Xpert Xpress SARS-CoV-2/FLU/RSV plus assay is intended as an aid in the diagnosis of influenza from Nasopharyngeal swab specimens and should not be used as a sole basis for treatment. Nasal washings and aspirates are unacceptable for Xpert Xpress SARS-CoV-2/FLU/RSV testing.  Fact Sheet for Patients: EntrepreneurPulse.com.au  Fact Sheet for Healthcare Providers: IncredibleEmployment.be  This test is not yet approved or cleared by the [place] FDA and has been authorized for detection and/or diagnosis of SARS-CoV-2 by FDA under an Emergency Use Authorization (EUA). This EUA will remain in  effect (meaning this test can be used) for the duration of the COVID-19 declaration under Section 564(b)(1) of the Act, 21 U.S.C. section 360bbb-3(b)(1), unless the authorization is terminated or revoked.  Performed at [facility], [address] [place], [place] [address]   Culture, body fluid w Gram Stain-bottle     Status: None (Preliminary result)   Collection Time: 05/19/20  9:38 AM   Specimen: Pleura  Result Value Ref Range Status   Specimen Description PLEURAL FLUID  Final   Special Requests LEFT LUNG  Final   Culture   Final    NO GROWTH < 24 HOURS Performed at [facility], [place] [address] [place], [place] [address]    Report Status PENDING  Incomplete  Gram stain     Status: None   Collection Time: 05/19/20  9:38 AM   Specimen: Pleura  Result Value Ref Range Status   Specimen Description PLEURAL FLUID  Final   Special Requests LEFT LUNG  Final   Gram Stain   Final    WBC PRESENT, PREDOMINANTLY MONONUCLEAR NO ORGANISMS SEEN CYTOSPIN SMEAR Performed at [facility], [place] [address] [place], [place] [address]    Report Status 05/19/2020 FINAL  Final  Culture, blood (Routine X 2) w Reflex to ID Panel     Status: None (Preliminary result)   Collection Time: 05/19/20  4:49 PM   Specimen: BLOOD RIGHT ARM  Result Value Ref Range Status   Specimen Description BLOOD RIGHT ARM  Final   Special Requests   Final    BOTTLES DRAWN AEROBIC ONLY Blood Culture adequate volume   Culture   Final    NO GROWTH < 12 HOURS Performed at [facility], [place] [address] [place], [place] [address]    Report Status PENDING  Incomplete  Culture, blood (Routine X 2) w Reflex to ID Panel     Status: None (Preliminary result)   Collection Time: 05/19/20  4:50 PM   Specimen: BLOOD  Result Value Ref Range Status   Specimen Description BLOOD SITE NOT SPECIFIED  Final   Special Requests   Final    BOTTLES DRAWN AEROBIC ONLY Blood Culture adequate volume    Culture   Final    NO GROWTH < 12 HOURS Performed at [facility], [place] [address], [place], [place] [address]    Report Status PENDING  Incomplete     Medications:   . atorvastatin  40 mg Oral Daily  . insulin aspart  0-15 Units Subcutaneous TID WC  . insulin aspart  0-5 Units Subcutaneous  QHS  . insulin aspart  5 Units Subcutaneous TID WC  . insulin glargine  20 Units Subcutaneous Daily  . mirtazapine  15 mg Oral QHS  . pantoprazole  40 mg Oral Daily  . PARoxetine  20 mg Oral Daily  . polyethylene glycol  17 g Oral BID  . sodium chloride flush  3 mL Intravenous Q12H  . traZODone  25 mg Oral QHS   Continuous Infusions: . sodium chloride    . ampicillin-sulbactam (UNASYN) IV        LOS: 4 days   [facility]  05/20/2020, 9:13 AM

## 2020-05-21 LAB — GLUCOSE, CAPILLARY
Glucose-Capillary: 113 mg/dL — ABNORMAL HIGH (ref 70–99)
Glucose-Capillary: 151 mg/dL — ABNORMAL HIGH (ref 70–99)
Glucose-Capillary: 220 mg/dL — ABNORMAL HIGH (ref 70–99)
Glucose-Capillary: 150 mg/dL — ABNORMAL HIGH (ref 70–99)

## 2020-05-21 NOTE — Plan of Care (Signed)
  Problem: Activity: Goal: Risk for activity intolerance will decrease Outcome: Not Progressing   

## 2020-05-21 NOTE — Progress Notes (Signed)
[facility] PROGRESS NOTE    Progress Note  [name] [name]  [id number] DOB: Mar 13, 1955 DOA: 05/16/2020 PCP: Janith Lima, MD     Brief Narrative:   [name] [name] is an 65 y.o. male past medical history of atrial fibrillation on Eliquis, uncontrolled insulin-dependent diabetes mellitus type 2, CAD status post CABG times 01/2001 came into the ED after falling approximately 17 stairs after extensive radiologic evaluation he was diagnosed with a left scapular fracture and coracoid fracture.  He was initially admitted to the trauma service and Dr. Lorin Mercy recommended nonoperative management.  Work-up is underway for bilateral pleural effusion Eliquis was held due to decreasing hemoglobin and effusions.  Assessment/Plan:   Bilateral pleural effusion: Continue to hold anticoagulation 2D echo showed EF of EF of 55%. Due to his dropping hemoglobin and being on anticoagulation thoracocentesis were performed the yield 1.3 L that showed no signs of bleeding or infection.  Possible aspiration pneumonia: Continue IV Unasyn he has defervesced. Physical therapy evaluated the patient recommended skilled nursing facility.  Left scapular and coracoid fracture: Orthopedic surgery recommended conservative management with a sling follow-up with him in 2 weeks. Therapy evaluated the patient and recommended home health PT. Multiple bruises in his back.  CAD with a history of CABG in 2003: No anginal symptoms continue statins holding ACE inhibitor aspirin and Eliquis. Cardiology appointment has been scheduled for 05/26/2020.  Paroxysmal atrial fibrillation: Rate control holding Eliquis.  Lower extremity swelling: DVT.  Normocytic anemia: There was a drop in hemoglobin from 13-9.5 in the setting of Eliquis and trauma. Eliquis has been on hold. He has not had any bloody bowel movement.  His back is all bruised. Thoracocentesis did not yield a hemothorax, CT of the chest on  admission did not show any rib fractures. Continue Protonix his hemoglobin has remained stable for the last 72 hours  Poorly controlled diabetes mellitus type 2: With an A1c of 12 he reported he stopped his Lantus due to nighttime low blood glucose. He was restarted on lower dose of Lantus with sliding scale continue current regimen.   DVT prophylaxis: scd Family Communication:mother Status is: Inpatient  Remains inpatient appropriate because:Hemodynamically unstable   Dispo: The patient is from: Home              Anticipated d/c is to: Home              Patient currently is not medically stable to d/c.   Difficult to place patient No        Code Status:     Code Status Orders  (From admission, onward)         Start     Ordered   05/16/20 1931  Full code  Continuous        05/16/20 1933        Code Status History    This patient has a current code status but no historical code status.   Advance Care Planning Activity        IV Access:    Peripheral IV   Procedures and diagnostic studies:   DG Chest 1 View  Result Date: 05/19/2020 CLINICAL DATA:  Status post left-sided thoracentesis. EXAM: CHEST  1 VIEW COMPARISON:  Chest CT 05/16/2020 FINDINGS: Interval evacuation of the left pleural effusion. No postprocedural pneumothorax. Suspect persistent layering right pleural effusion with hazy opacity of the right hemithorax. IMPRESSION: Interval evacuation of left pleural effusion without postprocedural pneumothorax. Electronically Signed   By: Marijo Sanes  M.D.   On: 05/19/2020 10:40   DG Chest Port 1 View  Result Date: 05/20/2020 CLINICAL DATA:  Fever, fall EXAM: PORTABLE CHEST 1 VIEW COMPARISON:  05/19/2020 FINDINGS: Pulmonary insufflation is stable. Moderate bibasilar pulmonary infiltrate has developed, infection versus aspiration. Small right pleural effusion is suspected. No pneumothorax. Coronary artery bypass grafting has been performed. Cardiac size  within normal limits. Pulmonary vascularity is normal. No acute bone abnormality. IMPRESSION: Interval development of moderate bibasilar pulmonary infiltrate, infection versus aspiration. Associated small right parapneumonic effusion. Electronically Signed   By: Fidela Salisbury MD   On: 05/20/2020 06:58   IR THORACENTESIS ASP PLEURAL SPACE W/IMG GUIDE  Result Date: 05/19/2020 INDICATION: Patient with history of atrial fibrillation on Eliquis, diabetes, coronary artery disease with prior CABG, recent fall at home with left scapular fracture and coracoid fracture, bilateral pleural effusions; request received for diagnostic and therapeutic left thoracentesis. EXAM: ULTRASOUND GUIDED DIAGNOSTIC AND THERAPEUTIC LEFT  THORACENTESIS MEDICATIONS: 1% lidocaine to skin/SQ tissue COMPLICATIONS: None immediate. PROCEDURE: An ultrasound guided thoracentesis was thoroughly discussed with the patient and questions answered. The benefits, risks, alternatives and complications were also discussed. The patient understands and wishes to proceed with the procedure. Written consent was obtained. Ultrasound was performed to localize and mark an adequate pocket of fluid in the left chest. The area was then prepped and draped in the normal sterile fashion. 1% Lidocaine was used for local anesthesia. Under ultrasound guidance a 6 Fr Safe-T-Centesis catheter was introduced. Thoracentesis was performed. The catheter was removed and a dressing applied. FINDINGS: A total of approximately 1.3 liters of yellow fluid was removed. Samples were sent to the laboratory as requested by the clinical team. IMPRESSION: Successful ultrasound guided diagnostic and therapeutic left thoracentesis yielding 1.3 liters of pleural fluid. Read by: Rowe Robert, PA-C Electronically Signed   By: Corrie Mckusick D.O.   On: 05/19/2020 10:05     Medical Consultants:    None.   Subjective:    Tedrick Raina relates his breathing is better than  yesterday.  Objective:    Vitals:   05/20/20 0900 05/20/20 1633 05/20/20 2144 05/21/20 0430  BP: 120/71 115/74 112/68 108/68  Pulse: (!) 53 60 60 61  Resp: 18 18 14 14   Temp: 98.9 F (37.2 C) 98.4 F (36.9 C) 98.5 F (36.9 C) 99.6 F (37.6 C)  TempSrc: Oral Oral Oral Oral  SpO2: 95% 96% 95% 94%  Weight:      Height:       SpO2: 94 % O2 Flow Rate (L/min): 2 L/min   Intake/Output Summary (Last 24 hours) at 05/21/2020 0926 Last data filed at 05/20/2020 2200 Gross per 24 hour  Intake 318 ml  Output 200 ml  Net 118 ml   Filed Weights   05/16/20 1003 05/19/20 2128  Weight: 79.4 kg 79.4 kg    Exam: General exam: In no acute distress. Respiratory system: Good air movement and clear to auscultation. Cardiovascular system: S1 & S2 heard, RRR. No JVD. Gastrointestinal system: Abdomen is nondistended, soft and nontender.  Extremities: No pedal edema. Skin: No rashes, lesions or ulcers Psychiatry: Judgement and insight appear normal. Mood & affect appropriate.  Data Reviewed:    Labs: Basic Metabolic Panel: Recent Labs  Lab 05/16/20 1256 05/17/20 0457 05/18/20 0405  NA 134* 134* 133*  K 4.2 4.7 4.0  CL 101 100 100  CO2 27 25 28   GLUCOSE 407* 267* 117*  BUN 12 13 20   CREATININE 0.55* 0.71 0.77  CALCIUM  7.9* 8.4* 8.2*   GFR Estimated Creatinine Clearance: 100.6 mL/min (by C-G formula based on SCr of 0.77 mg/dL). Liver Function Tests: Recent Labs  Lab 05/17/20 0457 05/18/20 1405  AST 16 14*  ALT 12 12  ALKPHOS 45 42  BILITOT 1.1 0.7  PROT 4.6* 4.7*  ALBUMIN 2.7* 2.6*   No results for input(s): LIPASE, AMYLASE in the last 168 hours. No results for input(s): AMMONIA in the last 168 hours. Coagulation profile No results for input(s): INR, PROTIME in the last 168 hours. COVID-19 Labs  No results for input(s): DDIMER, FERRITIN, LDH, CRP in the last 72 hours.  Lab Results  Component Value Date   SARSCOV2NAA NEGATIVE 05/16/2020   SARSCOV2NAA NEGATIVE  12/07/2019   [place] NEGATIVE 12/03/2019    CBC: Recent Labs  Lab 05/16/20 1256 05/17/20 0457 05/18/20 0405 05/19/20 1108  WBC 7.3 5.1 5.8 5.9  HGB 10.3* 9.4* 8.9* 8.7*  HCT 31.7* 29.0* 25.8* 25.6*  MCV 99.1 100.0 95.6 96.2  PLT 152 156 150 168   Cardiac Enzymes: No results for input(s): CKTOTAL, CKMB, CKMBINDEX, TROPONINI in the last 168 hours. BNP (last 3 results) No results for input(s): PROBNP in the last 8760 hours. CBG: Recent Labs  Lab 05/20/20 0639 05/20/20 1145 05/20/20 1632 05/20/20 2233 05/21/20 0705  GLUCAP 96 192* 174* 233* 113*   D-Dimer: No results for input(s): DDIMER in the last 72 hours. Hgb A1c: No results for input(s): HGBA1C in the last 72 hours. Lipid Profile: No results for input(s): CHOL, HDL, LDLCALC, TRIG, CHOLHDL, LDLDIRECT in the last 72 hours. Thyroid function studies: No results for input(s): TSH, T4TOTAL, T3FREE, THYROIDAB in the last 72 hours.  Invalid input(s): FREET3 Anemia work up: No results for input(s): VITAMINB12, FOLATE, FERRITIN, TIBC, IRON, RETICCTPCT in the last 72 hours. Sepsis Labs: Recent Labs  Lab 05/16/20 1256 05/17/20 0457 05/18/20 0405 05/19/20 1108  WBC 7.3 5.1 5.8 5.9   Microbiology Recent Results (from the past 240 hour(s))  Resp Panel by RT-PCR (Flu A&B, Covid) Nasopharyngeal Swab     Status: None   Collection Time: 05/16/20  1:06 PM   Specimen: Nasopharyngeal Swab; Nasopharyngeal(NP) swabs in vial transport medium  Result Value Ref Range Status   SARS Coronavirus 2 by RT PCR NEGATIVE NEGATIVE Final    Comment: (NOTE) SARS-CoV-2 target nucleic acids are NOT DETECTED.  The SARS-CoV-2 RNA is generally detectable in upper respiratory specimens during the acute phase of infection. The lowest concentration of SARS-CoV-2 viral copies this assay can detect is 138 copies/mL. A negative result does not preclude SARS-Cov-2 infection and should not be used as the sole basis for treatment or other patient  management decisions. A negative result may occur with  improper specimen collection/handling, submission of specimen other than nasopharyngeal swab, presence of viral mutation(s) within the areas targeted by this assay, and inadequate number of viral copies(<138 copies/mL). A negative result must be combined with clinical observations, patient history, and epidemiological information. The expected result is Negative.  Fact Sheet for Patients:  EntrepreneurPulse.com.au  Fact Sheet for Healthcare Providers:  IncredibleEmployment.be  This test is no t yet approved or cleared by the [place] FDA and  has been authorized for detection and/or diagnosis of SARS-CoV-2 by FDA under an Emergency Use Authorization (EUA). This EUA will remain  in effect (meaning this test can be used) for the duration of the COVID-19 declaration under Section 564(b)(1) of the Act, 21 U.S.C.section 360bbb-3(b)(1), unless the authorization is terminated  or revoked sooner.  Influenza A by PCR NEGATIVE NEGATIVE Final   Influenza B by PCR NEGATIVE NEGATIVE Final    Comment: (NOTE) The Xpert Xpress SARS-CoV-2/FLU/RSV plus assay is intended as an aid in the diagnosis of influenza from Nasopharyngeal swab specimens and should not be used as a sole basis for treatment. Nasal washings and aspirates are unacceptable for Xpert Xpress SARS-CoV-2/FLU/RSV testing.  Fact Sheet for Patients: EntrepreneurPulse.com.au  Fact Sheet for Healthcare Providers: IncredibleEmployment.be  This test is not yet approved or cleared by the [place] FDA and has been authorized for detection and/or diagnosis of SARS-CoV-2 by FDA under an Emergency Use Authorization (EUA). This EUA will remain in effect (meaning this test can be used) for the duration of the COVID-19 declaration under Section 564(b)(1) of the Act, 21 U.S.C. section 360bbb-3(b)(1),  unless the authorization is terminated or revoked.  Performed at [facility], [address] [place], [place] [address]   Culture, body fluid w Gram Stain-bottle     Status: None (Preliminary result)   Collection Time: 05/19/20  9:38 AM   Specimen: Pleura  Result Value Ref Range Status   Specimen Description PLEURAL FLUID  Final   Special Requests LEFT LUNG  Final   Culture   Final    NO GROWTH < 24 HOURS Performed at [facility], [place] [address] [place], [place] [address]    Report Status PENDING  Incomplete  Gram stain     Status: None   Collection Time: 05/19/20  9:38 AM   Specimen: Pleura  Result Value Ref Range Status   Specimen Description PLEURAL FLUID  Final   Special Requests LEFT LUNG  Final   Gram Stain   Final    WBC PRESENT, PREDOMINANTLY MONONUCLEAR NO ORGANISMS SEEN CYTOSPIN SMEAR Performed at [facility], [place] [address] [place], [place] [address]    Report Status 05/19/2020 FINAL  Final  Culture, blood (Routine X 2) w Reflex to ID Panel     Status: None (Preliminary result)   Collection Time: 05/19/20  4:49 PM   Specimen: BLOOD RIGHT ARM  Result Value Ref Range Status   Specimen Description BLOOD RIGHT ARM  Final   Special Requests   Final    BOTTLES DRAWN AEROBIC ONLY Blood Culture adequate volume   Culture   Final    NO GROWTH < 12 HOURS Performed at [facility], [place] [address], [place], [place] [address]    Report Status PENDING  Incomplete  Culture, blood (Routine X 2) w Reflex to ID Panel     Status: None (Preliminary result)   Collection Time: 05/19/20  4:50 PM   Specimen: BLOOD  Result Value Ref Range Status   Specimen Description BLOOD SITE NOT SPECIFIED  Final   Special Requests   Final    BOTTLES DRAWN AEROBIC ONLY Blood Culture adequate volume   Culture   Final    NO GROWTH < 12 HOURS Performed at [facility], [place] [address] [place], [place] [address]    Report Status PENDING   Incomplete     Medications:   . atorvastatin  40 mg Oral Daily  . insulin aspart  0-15 Units Subcutaneous TID WC  . insulin aspart  0-5 Units Subcutaneous QHS  . insulin aspart  5 Units Subcutaneous TID WC  . insulin glargine  20 Units Subcutaneous Daily  . mirtazapine  15 mg Oral QHS  . pantoprazole  40 mg Oral Daily  . PARoxetine  20 mg Oral Daily  . sodium chloride flush  3 mL Intravenous Q12H  . traZODone  25 mg Oral QHS   Continuous Infusions: . sodium chloride    . ampicillin-sulbactam (UNASYN) IV 1.5 g (05/21/20 0431)      LOS: 5 days   Charlynne Cousins  [facility]  05/21/2020, 9:26 AM

## 2020-05-22 LAB — GLUCOSE, CAPILLARY
Glucose-Capillary: 104 mg/dL — ABNORMAL HIGH (ref 70–99)
Glucose-Capillary: 149 mg/dL — ABNORMAL HIGH (ref 70–99)
Glucose-Capillary: 91 mg/dL (ref 70–99)
Glucose-Capillary: 77 mg/dL (ref 70–99)
Glucose-Capillary: 92 mg/dL (ref 70–99)

## 2020-05-22 NOTE — Progress Notes (Signed)
Called into his room by lab tech,he has been refusing lab works for several days now.Explained the needs to have blood drawn from him,patient said'' no one will stick to me anymore ,I am going to be discharged tomorrow''right R.N. explained the importance of his lab work especially on his INR level but patient still refusing.

## 2020-05-22 NOTE — TOC Progression Note (Signed)
Transition of Care [facility]) - Progression Note    Patient Details  Name: [name] [name] MRN: [id number] Date of Birth: 10-Dec-1955  Transition of Care [facility]) CM/SW [place], [place] Phone Number: 05/22/2020, 11:07 AM  Clinical Narrative:   CSW completed referral and faxed out for bed offers for patient. CSW to follow.    Expected Discharge Plan: [place] Barriers to Discharge: Continued Medical Work up  Expected Discharge Plan and Services Expected Discharge Plan: [place] In-house Referral: Clinical Social Work Discharge Planning Services: CM Consult   Living arrangements for the past 2 months: Single Family Home                                       Social Determinants of Health (SDOH) Interventions    Readmission Risk Interventions No flowsheet data found.

## 2020-05-22 NOTE — NC FL2 (Signed)
[place] MEDICAID FL2 LEVEL OF CARE SCREENING TOOL     IDENTIFICATION  Patient Name: [name] [name] Birthdate: 07/05/1955 Sex: male Admission Date (Current Location): 05/16/2020  [facility] and [place] Number:  Herbalist and Address:  [facility], [place] [address] [place], [place] [address]      Provider Number: [id number]  Attending Physician Name and Address:  Charlynne Cousins, MD  Relative Name and Phone Number:       Current Level of Care: Hospital Recommended Level of Care: [place] Prior Approval Number:    Date Approved/Denied:   PASRR Number: [id number]  Discharge Plan: SNF    Current Diagnoses: Patient Active Problem List   Diagnosis Date Noted  . Fall (on) (from) other stairs and steps, initial encounter 05/16/2020  . Closed left scapular fracture 05/16/2020  . Hypoxia 05/16/2020  . BPH with obstruction/lower urinary tract symptoms 03/16/2020  . PAF (paroxysmal atrial fibrillation) ([place]) 03/16/2020  . Viral wart on finger 03/16/2020  . Type 1 diabetes mellitus with diabetic polyneuropathy ([place]) 01/27/2020  . Anemia 01/13/2020  . Encounter for general adult medical examination with abnormal findings 01/13/2020  . Hyperlipidemia LDL goal <70 01/13/2020  . Benign prostatic hyperplasia without lower urinary tract symptoms 01/13/2020  . Claudication of both lower extremities ([place]) 01/13/2020  . Coronary artery disease involving bypass graft of transplanted heart without angina pectoris 01/13/2020  . Paroxysmal atrial fibrillation ([place]) 01/13/2020  . Recurrent major depressive disorder, in partial remission ([place]) 01/13/2020  . Aortic dissection ([place]) 02/05/2019  . Coronary artery disease 02/05/2019  . Hyperlipidemia 02/05/2019  . Hypertension 02/05/2019  . Psoriasis 02/05/2019  . Vitamin D deficiency 01/07/2019  . Uncontrolled type 2 diabetes mellitus with hyperglycemia ([place]) 01/07/2019  .  Microalbuminuria 01/07/2019  . Depression 06/10/2018  . DKA (diabetic ketoacidoses) 06/10/2018  . Non-intractable vomiting 06/10/2018  . Patellar tendonitis of right knee 04/24/2018  . Ulnar neuropathy of left upper extremity 07/12/2017  . Peyronie's disease 06/09/2015  . Erectile dysfunction 03/15/2015  . S/P aortic aneurysm repair 05/19/2012  . S/P CABG (coronary artery bypass graft) 05/19/2012  . Non-proliferative diabetic retinopathy ([place]) 02/27/2011  . Contracture of hand joint 11/01/2010  . Dupuytren's contracture of both hands 11/01/2010    Orientation RESPIRATION BLADDER Height & Weight     Self,Time,Situation,Place  Normal Incontinent Weight: 175 lb 0.1 oz (79.4 kg) Height:  6\' 2"  (188 cm)  BEHAVIORAL SYMPTOMS/MOOD NEUROLOGICAL BOWEL NUTRITION STATUS      Incontinent Diet (carb modified, heart healthy)  AMBULATORY STATUS COMMUNICATION OF NEEDS Skin   Extensive Assist Verbally Normal                       Personal Care Assistance Level of Assistance  Bathing,Feeding,Dressing Bathing Assistance: Maximum assistance Feeding assistance: Limited assistance Dressing Assistance: Maximum assistance     Functional Limitations Info  Sight,Hearing Sight Info: Impaired Hearing Info: Impaired      SPECIAL CARE FACTORS FREQUENCY  PT (By licensed PT),OT (By licensed OT)     PT Frequency: 5x/wk OT Frequency: 5x/wk            Contractures Contractures Info: Not present    Additional Factors Info  Code Status,Allergies,Psychotropic,Insulin Sliding Scale Code Status Info: Full Allergies Info: NKA Psychotropic Info: Remeron 15mg  daily at bed Insulin Sliding Scale Info: see DC summary       Current Medications (05/22/2020):  This is the current hospital active medication list  Current Facility-Administered Medications  Medication Dose Route Frequency Provider Last Rate Last Admin  . 0.9 %  sodium chloride infusion  250 mL Intravenous PRN Para Skeans, MD      .  acetaminophen (TYLENOL) tablet 650 mg  650 mg Oral Q6H PRN Chotiner, Yevonne Aline, MD   650 mg at 05/21/20 2219  . ampicillin-sulbactam (UNASYN) 1.5 g in sodium chloride 0.9 % 100 mL IVPB  1.5 g Intravenous Q6H Charlynne Cousins, MD 200 mL/hr at 05/22/20 1106 1.5 g at 05/22/20 1106  . atorvastatin (LIPITOR) tablet 40 mg  40 mg Oral Daily Para Skeans, MD   40 mg at 05/22/20 1104  . hydrALAZINE (APRESOLINE) injection 10 mg  10 mg Intravenous Q6H PRN Para Skeans, MD      . HYDROcodone-acetaminophen (NORCO/VICODIN) 5-325 MG per tablet 1-2 tablet  1-2 tablet Oral Q6H PRN Charlynne Cousins, MD   2 tablet at 05/22/20 0700  . insulin aspart (novoLOG) injection 0-15 Units  0-15 Units Subcutaneous TID WC Para Skeans, MD   5 Units at 05/21/20 1724  . insulin aspart (novoLOG) injection 0-5 Units  0-5 Units Subcutaneous QHS Para Skeans, MD   2 Units at 05/20/20 2327  . insulin aspart (novoLOG) injection 5 Units  5 Units Subcutaneous TID WC Patrecia Pour, MD   5 Units at 05/21/20 1724  . insulin glargine (LANTUS) injection 20 Units  20 Units Subcutaneous Daily Patrecia Pour, MD   20 Units at 05/22/20 1104  . lidocaine (PF) (XYLOCAINE) 1 % injection    PRN Allred, Darrell K, PA-C   30 mL at 05/19/20 0936  . mirtazapine (REMERON) tablet 15 mg  15 mg Oral QHS Para Skeans, MD   15 mg at 05/21/20 2225  . oxyCODONE (Oxy IR/ROXICODONE) immediate release tablet 10 mg  10 mg Oral Q4H PRN Charlynne Cousins, MD   10 mg at 05/22/20 0527  . pantoprazole (PROTONIX) EC tablet 40 mg  40 mg Oral Daily Pham, Minh Q, RPH-CPP   40 mg at 05/22/20 1104  . PARoxetine (PAXIL) tablet 20 mg  20 mg Oral Daily Para Skeans, MD   20 mg at 05/22/20 1104  . sodium chloride flush (NS) 0.9 % injection 3 mL  3 mL Intravenous Q12H Para Skeans, MD   3 mL at 05/21/20 2229  . sodium chloride flush (NS) 0.9 % injection 3 mL  3 mL Intravenous PRN Para Skeans, MD   3 mL at 05/16/20 2141  . traZODone (DESYREL) tablet 25 mg  25 mg Oral  QHS Para Skeans, MD   25 mg at 05/21/20 2225     Discharge Medications: Please see discharge summary for a list of discharge medications.  Relevant Imaging Results:  Relevant Lab Results:   Additional Information SS#: [id number]  Geralynn Ochs, LCSW

## 2020-05-22 NOTE — Progress Notes (Signed)
Patient refused a.m lab works.

## 2020-05-22 NOTE — Plan of Care (Signed)
  Problem: Clinical Measurements: Goal: Respiratory complications will improve Outcome: Adequate for Discharge   

## 2020-05-22 NOTE — Progress Notes (Signed)
Patient refused  To checked his bottom and refused getting out from the bed.

## 2020-05-22 NOTE — Progress Notes (Signed)
[facility] PROGRESS NOTE    Progress Note  Jalik Gellatly  WJX:[id number] DOB: 1955/06/05 DOA: 05/16/2020 PCP: Janith Lima, MD     Brief Narrative:   Tarl Cephas is an 65 y.o. male past medical history of atrial fibrillation on Eliquis, uncontrolled insulin-dependent diabetes mellitus type 2, CAD status post CABG times 01/2001 came into the ED after falling approximately 17 stairs after extensive radiologic evaluation he was diagnosed with a left scapular fracture and coracoid fracture.  He was initially admitted to the trauma service and Dr. Lorin Mercy recommended nonoperative management.  Work-up is underway for bilateral pleural effusion Eliquis was held due to decreasing hemoglobin and effusions.  Assessment/Plan:   Bilateral pleural effusion: Continue to hold anticoagulation 2D echo showed EF of EF of 55%. Due to his dropping hemoglobin and being on anticoagulation thoracocentesis were performed the yield 1.3 L that showed no signs of bleeding or infection. He has refused labs for the last 2 days have explained to him the risk and benefits of doing this as we do not know where his hemoglobin is. He relates that he is not having any more blood drawn's while he is in the hospital.  I have told him we cannot resume Eliquis.  He relates that he understands the the risk and he will have his PCP check a CBC, so we will start Eliquis at that time once his PCP has a hemoglobin.  Possible aspiration pneumonia: Continue IV Unasyn he has defervesced. Physical therapy evaluated the patient recommended skilled nursing facility.  Left scapular and coracoid fracture: Orthopedic surgery recommended conservative management with a sling follow-up with him in 2 weeks. Therapy evaluated the patient and recommended home health PT. Multiple bruises in his back.  CAD with a history of CABG in 2003: No anginal symptoms continue statins holding ACE inhibitor aspirin and  Eliquis. Cardiology appointment has been scheduled for 05/26/2020.  Paroxysmal atrial fibrillation: Rate controlled, will continue to hold Eliquis.  To follow-up with PCP as an outpatient check a CBC if hemoglobin is stable can resume Eliquis. He understands the risk and benefits of refusing blood draws and not being on Eliquis.  Lower extremity swelling: DVT.  Normocytic anemia: There was a drop in hemoglobin from 13-9.5 in the setting of Eliquis and trauma. Eliquis has been on hold. He has not had any bloody bowel movement.  His back is all bruised. Thoracocentesis did not yield a hemothorax, CT of the chest on admission did not show any rib fractures. Continue Protonix his hemoglobin has remained stable for the last 72 hours  Poorly controlled diabetes mellitus type 2: With an A1c of 12 he reported he stopped his Lantus due to nighttime low blood glucose. He was restarted on lower dose of Lantus with sliding scale continue current regimen.   DVT prophylaxis: scd Family Communication:mother Status is: Inpatient  Remains inpatient appropriate because:Hemodynamically unstable   Dispo: The patient is from: Home              Anticipated d/c is to: Home              Patient currently is not medically stable to d/c.   Difficult to place patient No        Code Status:     Code Status Orders  (From admission, onward)         Start     Ordered   05/16/20 1931  Full code  Continuous  05/16/20 1933        Code Status History    This patient has a current code status but no historical code status.   Advance Care Planning Activity        IV Access:    Peripheral IV   Procedures and diagnostic studies:   No results found.   Medical Consultants:    None.   Subjective:    Brooke Pace he relates he is not having his blood drawn again.  We will have it as an outpatient when he sees his PCP.  Objective:    Vitals:   05/21/20 1624  05/21/20 2052 05/22/20 0520 05/22/20 0855  BP: 127/74 (!) 125/50 119/72 108/66  Pulse: 61 67 61 60  Resp: 18 16 19 18   Temp: 99.7 F (37.6 C) 100.2 F (37.9 C) 99 F (37.2 C) 99.1 F (37.3 C)  TempSrc: Oral Oral Oral Oral  SpO2: 94% 95% 93% 93%  Weight:      Height:       SpO2: 93 % O2 Flow Rate (L/min): 2 L/min   Intake/Output Summary (Last 24 hours) at 05/22/2020 0941 Last data filed at 05/22/2020 0600 Gross per 24 hour  Intake 879.86 ml  Output 2150 ml  Net -1270.14 ml   Filed Weights   05/16/20 1003 05/19/20 2128  Weight: 79.4 kg 79.4 kg    Exam: General exam: In no acute distress. Respiratory system: Good air movement and clear to auscultation. Cardiovascular system: S1 & S2 heard, RRR. No JVD. Gastrointestinal system: Abdomen is nondistended, soft and nontender.  Extremities: No pedal edema. Skin: No rashes, lesions or ulcers Psychiatry: Judgement and insight appear normal. Mood & affect appropriate.  Data Reviewed:    Labs: Basic Metabolic Panel: Recent Labs  Lab 05/16/20 1256 05/17/20 0457 05/18/20 0405  NA 134* 134* 133*  K 4.2 4.7 4.0  CL 101 100 100  CO2 27 25 28   GLUCOSE 407* 267* 117*  BUN 12 13 20   CREATININE 0.55* 0.71 0.77  CALCIUM 7.9* 8.4* 8.2*   GFR Estimated Creatinine Clearance: 100.6 mL/min (by C-G formula based on SCr of 0.77 mg/dL). Liver Function Tests: Recent Labs  Lab 05/17/20 0457 05/18/20 1405  AST 16 14*  ALT 12 12  ALKPHOS 45 42  BILITOT 1.1 0.7  PROT 4.6* 4.7*  ALBUMIN 2.7* 2.6*   No results for input(s): LIPASE, AMYLASE in the last 168 hours. No results for input(s): AMMONIA in the last 168 hours. Coagulation profile No results for input(s): INR, PROTIME in the last 168 hours. COVID-19 Labs  No results for input(s): DDIMER, FERRITIN, LDH, CRP in the last 72 hours.  Lab Results  Component Value Date   SARSCOV2NAA NEGATIVE 05/16/2020   SARSCOV2NAA NEGATIVE 12/07/2019   [place] NEGATIVE 12/03/2019     CBC: Recent Labs  Lab 05/16/20 1256 05/17/20 0457 05/18/20 0405 05/19/20 1108  WBC 7.3 5.1 5.8 5.9  HGB 10.3* 9.4* 8.9* 8.7*  HCT 31.7* 29.0* 25.8* 25.6*  MCV 99.1 100.0 95.6 96.2  PLT 152 156 150 168   Cardiac Enzymes: No results for input(s): CKTOTAL, CKMB, CKMBINDEX, TROPONINI in the last 168 hours. BNP (last 3 results) No results for input(s): PROBNP in the last 8760 hours. CBG: Recent Labs  Lab 05/21/20 0705 05/21/20 1138 05/21/20 1625 05/21/20 2053 05/22/20 0654  GLUCAP 113* 151* 220* 150* 104*   D-Dimer: No results for input(s): DDIMER in the last 72 hours. Hgb A1c: No results for input(s): HGBA1C in the  last 72 hours. Lipid Profile: No results for input(s): CHOL, HDL, LDLCALC, TRIG, CHOLHDL, LDLDIRECT in the last 72 hours. Thyroid function studies: No results for input(s): TSH, T4TOTAL, T3FREE, THYROIDAB in the last 72 hours.  Invalid input(s): FREET3 Anemia work up: No results for input(s): VITAMINB12, FOLATE, FERRITIN, TIBC, IRON, RETICCTPCT in the last 72 hours. Sepsis Labs: Recent Labs  Lab 05/16/20 1256 05/17/20 0457 05/18/20 0405 05/19/20 1108  WBC 7.3 5.1 5.8 5.9   Microbiology Recent Results (from the past 240 hour(s))  Resp Panel by RT-PCR (Flu A&B, Covid) Nasopharyngeal Swab     Status: None   Collection Time: 05/16/20  1:06 PM   Specimen: Nasopharyngeal Swab; Nasopharyngeal(NP) swabs in vial transport medium  Result Value Ref Range Status   SARS Coronavirus 2 by RT PCR NEGATIVE NEGATIVE Final    Comment: (NOTE) SARS-CoV-2 target nucleic acids are NOT DETECTED.  The SARS-CoV-2 RNA is generally detectable in upper respiratory specimens during the acute phase of infection. The lowest concentration of SARS-CoV-2 viral copies this assay can detect is 138 copies/mL. A negative result does not preclude SARS-Cov-2 infection and should not be used as the sole basis for treatment or other patient management decisions. A negative result may  occur with  improper specimen collection/handling, submission of specimen other than nasopharyngeal swab, presence of viral mutation(s) within the areas targeted by this assay, and inadequate number of viral copies(<138 copies/mL). A negative result must be combined with clinical observations, patient history, and epidemiological information. The expected result is Negative.  Fact Sheet for Patients:  EntrepreneurPulse.com.au  Fact Sheet for Healthcare Providers:  IncredibleEmployment.be  This test is no t yet approved or cleared by the [place] FDA and  has been authorized for detection and/or diagnosis of SARS-CoV-2 by FDA under an Emergency Use Authorization (EUA). This EUA will remain  in effect (meaning this test can be used) for the duration of the COVID-19 declaration under Section 564(b)(1) of the Act, 21 U.S.C.section 360bbb-3(b)(1), unless the authorization is terminated  or revoked sooner.       Influenza A by PCR NEGATIVE NEGATIVE Final   Influenza B by PCR NEGATIVE NEGATIVE Final    Comment: (NOTE) The Xpert Xpress SARS-CoV-2/FLU/RSV plus assay is intended as an aid in the diagnosis of influenza from Nasopharyngeal swab specimens and should not be used as a sole basis for treatment. Nasal washings and aspirates are unacceptable for Xpert Xpress SARS-CoV-2/FLU/RSV testing.  Fact Sheet for Patients: EntrepreneurPulse.com.au  Fact Sheet for Healthcare Providers: IncredibleEmployment.be  This test is not yet approved or cleared by the [place] FDA and has been authorized for detection and/or diagnosis of SARS-CoV-2 by FDA under an Emergency Use Authorization (EUA). This EUA will remain in effect (meaning this test can be used) for the duration of the COVID-19 declaration under Section 564(b)(1) of the Act, 21 U.S.C. section 360bbb-3(b)(1), unless the authorization is terminated  or revoked.  Performed at [facility], [address] [place], [place] [address]   Culture, body fluid w Gram Stain-bottle     Status: None (Preliminary result)   Collection Time: 05/19/20  9:38 AM   Specimen: Pleura  Result Value Ref Range Status   Specimen Description PLEURAL FLUID  Final   Special Requests LEFT LUNG  Final   Culture   Final    NO GROWTH 3 DAYS Performed at [facility], [address] [place], [place] [address]    Report Status PENDING  Incomplete  Gram stain  Status: None   Collection Time: 05/19/20  9:38 AM   Specimen: Pleura  Result Value Ref Range Status   Specimen Description PLEURAL FLUID  Final   Special Requests LEFT LUNG  Final   Gram Stain   Final    WBC PRESENT, PREDOMINANTLY MONONUCLEAR NO ORGANISMS SEEN CYTOSPIN SMEAR Performed at [facility], [address] [place], [place] [address]    Report Status 05/19/2020 FINAL  Final  Culture, blood (Routine X 2) w Reflex to ID Panel     Status: None (Preliminary result)   Collection Time: 05/19/20  4:49 PM   Specimen: BLOOD RIGHT ARM  Result Value Ref Range Status   Specimen Description BLOOD RIGHT ARM  Final   Special Requests   Final    BOTTLES DRAWN AEROBIC ONLY Blood Culture adequate volume   Culture   Final    NO GROWTH 3 DAYS Performed at [facility], [place] [address] [place], [place] [address]    Report Status PENDING  Incomplete  Culture, blood (Routine X 2) w Reflex to ID Panel     Status: None (Preliminary result)   Collection Time: 05/19/20  4:50 PM   Specimen: BLOOD  Result Value Ref Range Status   Specimen Description BLOOD SITE NOT SPECIFIED  Final   Special Requests   Final    BOTTLES DRAWN AEROBIC ONLY Blood Culture adequate volume   Culture   Final    NO GROWTH 3 DAYS Performed at [name], [address] [place], [place] [address]    Report Status PENDING  Incomplete     Medications:   . atorvastatin  40 mg  Oral Daily  . insulin aspart  0-15 Units Subcutaneous TID WC  . insulin aspart  0-5 Units Subcutaneous QHS  . insulin aspart  5 Units Subcutaneous TID WC  . insulin glargine  20 Units Subcutaneous Daily  . mirtazapine  15 mg Oral QHS  . pantoprazole  40 mg Oral Daily  . PARoxetine  20 mg Oral Daily  . sodium chloride flush  3 mL Intravenous Q12H  . traZODone  25 mg Oral QHS   Continuous Infusions: . sodium chloride    . ampicillin-sulbactam (UNASYN) IV 1.5 g (05/22/20 0530)      LOS: 6 days   Charlynne Cousins  [facility]  05/22/2020, 9:41 AM

## 2020-05-23 DIAGNOSIS — J90 Pleural effusion, not elsewhere classified: Secondary | ICD-10-CM

## 2020-05-23 LAB — GLUCOSE, CAPILLARY
Glucose-Capillary: 187 mg/dL — ABNORMAL HIGH (ref 70–99)
Glucose-Capillary: 168 mg/dL — ABNORMAL HIGH (ref 70–99)
Glucose-Capillary: 106 mg/dL — ABNORMAL HIGH (ref 70–99)
Glucose-Capillary: 109 mg/dL — ABNORMAL HIGH (ref 70–99)

## 2020-05-23 MED ORDER — AMOXICILLIN-POT CLAVULANATE 875-125 MG PO TABS: 1.0000 | ORAL_TABLET | Freq: Two times a day (BID) | ORAL | 0 refills | Status: AC

## 2020-05-23 MED ORDER — HYDROCODONE-ACETAMINOPHEN 5-325 MG PO TABS: 1.0000 | ORAL_TABLET | Freq: Four times a day (QID) | ORAL | 0 refills | Status: AC | PRN

## 2020-05-23 NOTE — Progress Notes (Signed)
Occupational Therapy Treatment Patient Details Name: [name] [name] MRN: [id number] DOB: 27-Feb-1955 Today's Date: 05/23/2020    History of present illness [name] [name] is a 65 y.o. male seen for pleural effusion with hypoxia, as well as a fall resulting in L closed scapular fx, to be managed with a sling, per Ortho   OT comments  Pt making steady progress towards OT goals this session. Session conducted in conjunction with PT to maximize pts activity tolerance. Pt continues to be most limited by pain and generalized deconditioning but able to progress OOB to recliner with less assist in comparison to previous session. Pt continues to require total A +2 for bed mobility to exit to EOB to pts R side. MOD A +2 to pivot to recliner with hand held assist. Donned sling with MAX A from EOB and reinforced WB restrictions. Pt would continue to benefit from skilled occupational therapy while admitted and after d/c to address the below listed limitations in order to improve overall functional mobility and facilitate independence with BADL participation. DC plan remains appropriate, will follow acutely per POC.    Follow Up Recommendations  SNF    Equipment Recommendations  Other (comment) (defer to next venue of care)    Recommendations for Other Services      Precautions / Restrictions Precautions Precautions: Fall Required Braces or Orthoses: Sling Restrictions Weight Bearing Restrictions: Yes LUE Weight Bearing: Non weight bearing Other Position/Activity Restrictions: No weight bearing status in order set; Will proceed as NWB LUE unless otherwise ordered       Mobility Bed Mobility Overal bed mobility: Needs Assistance Bed Mobility: Supine to Sit     Supine to sit: HOB elevated;Total assist;+2 for physical assistance     General bed mobility comments: increased cuing to initiate movement of LE off bed, increased hip pain necessitated movement of LE together, decreased  ability to use L back/shoulder for support due to pain necessitated 3 people to come to EoB safely    Transfers Overall transfer level: Needs assistance Equipment used: 2 person hand held assist Transfers: Sit to/from [facility] Sit to Stand: Mod assist;+2 safety/equipment Stand pivot transfers: Mod assist;+2 physical assistance;+2 safety/equipment       General transfer comment: modAx2 and power up from elevated bed position, pt able to perform 2x sit<>stand on second attempt requests to get to chair    Balance Overall balance assessment: Needs assistance Sitting-balance support: Single extremity supported Sitting balance-Leahy Scale: Poor Sitting balance - Comments: RUE used to support self in sitting   Standing balance support: Bilateral upper extremity supported Standing balance-Leahy Scale: Poor Standing balance comment: modAx2 for safety                           ADL either performed or assessed with clinical judgement   ADL Overall ADL's : Needs assistance/impaired                 Upper Body Dressing : Maximal assistance;Sitting Upper Body Dressing Details (indicate cue type and reason): to don sling     Toilet Transfer: Moderate assistance;+2 for physical assistance;+2 for safety/equipment Toilet Transfer Details (indicate cue type and reason): simualted via functional mobility from EOB >recliner with MOD A +2         Functional mobility during ADLs: Moderate assistance;+2 for physical assistance (stand pivot only) General ADL Comments: pt continues to present with increased pain, decreased strength, and decreased activity tolerance. pt  was able to progress OOB to recliner this session with less physical assist     Vision Baseline Vision/History: Wears glasses Wears Glasses: At all times Additional Comments: unsure of baseline visual deficits but pt reports" I just can't see very well"   Perception     Praxis       Cognition Arousal/Alertness: Awake/alert Behavior During Therapy: Flat affect Overall Cognitive Status: Impaired/Different from baseline Area of Impairment: Attention;Memory;Following commands;Safety/judgement;Awareness;Problem solving                   Current Attention Level: Sustained Memory: Decreased short-term memory;Decreased recall of precautions Following Commands: Follows one step commands with increased time Safety/Judgement: Decreased awareness of safety;Decreased awareness of deficits Awareness: Intellectual Problem Solving: Slow processing;Decreased initiation;Difficulty sequencing;Requires verbal cues;Requires tactile cues General Comments: continues to be slow in response and processing commands, requiring maximal cuing        Exercises Other Exercises Other Exercises: pt pulling >750 mL on IS during session   Shoulder Instructions       General Comments donned LUE in sling once pt sitting EOB, elevated LUE on pillow at end of session, pts fmaily member enter at end of session    Pertinent Vitals/ Pain       Pain Assessment: Faces Faces Pain Scale: Hurts whole lot Pain Location: L shoulder/scapula, L knee Pain Descriptors / Indicators: Grimacing;Guarding;Constant Pain Intervention(s): Limited activity within patient's tolerance;Monitored during session;Repositioned  Home Living                                          Prior Functioning/Environment              Frequency  Min 3X/week        Progress Toward Goals  OT Goals(current goals can now be found in the care plan section)  Progress towards OT goals: Progressing toward goals  Acute Rehab OT Goals Patient Stated Goal: to go to rehab OT Goal Formulation: With patient Time For Goal Achievement: 06/01/20 Potential to Achieve Goals: Good  Plan Discharge plan remains appropriate;Frequency remains appropriate    Co-evaluation    PT/OT/SLP Co-Evaluation/Treatment:  Yes Reason for Co-Treatment: For patient/therapist safety;To address functional/ADL transfers PT goals addressed during session: Mobility/safety with mobility OT goals addressed during session: ADL's and self-care      AM-PAC OT "6 Clicks" Daily Activity     Outcome Measure   Help from another person eating meals?: A Little Help from another person taking care of personal grooming?: A Little Help from another person toileting, which includes using toliet, bedpan, or urinal?: A Lot Help from another person bathing (including washing, rinsing, drying)?: A Lot Help from another person to put on and taking off regular upper body clothing?: Total Help from another person to put on and taking off regular lower body clothing?: Total 6 Click Score: 12    End of Session Equipment Utilized During Treatment: Gait belt;Other (comment) (sling LUE)  OT Visit Diagnosis: Unsteadiness on feet (R26.81);Other abnormalities of gait and mobility (R26.89);Muscle weakness (generalized) (M62.81);History of falling (Z91.81);Other symptoms and signs involving cognitive function;Cognitive communication deficit (R41.841);Pain Pain - Right/Left: Left Pain - part of body: Arm;Knee   Activity Tolerance Patient tolerated treatment well   Patient Left in chair;with call bell/phone within reach;with chair alarm set   Nurse Communication Mobility status;Other (comment) (+2 back to bed)  Time: 9450-3888 OT Time Calculation (min): 25 min  Charges: OT General Charges $OT Visit: 1 Visit OT Treatments $Self Care/Home Management : 8-22 mins  Harley Alto., COTA/L Acute Rehabilitation Services [place] 05/23/2020, 11:12 AM

## 2020-05-23 NOTE — Progress Notes (Signed)
Physical Therapy Treatment Patient Details Name: [name] [name] MRN: [id number] DOB: [birth year] Today's Date: 05/23/2020    History of Present Illness [name] [name] is a 65 y.o. male seen for pleural effusion with hypoxia, as well as a fall resulting in L closed scapular fx, to be managed with a sling, per Ortho    PT Comments    Pt much more participatory today, despite reporting 8/10 pain in his shoulder and L hip. Pt continues to require +2 assist due to this pain. Pt is total Ax3 to get to EoB for safety and modAx2 for sit<>stand and stepping to recliner. D/c plans remain appropriate at this time. PT will continue to follow acutely.    Follow Up Recommendations  SNF;Supervision for mobility/OOB     Equipment Recommendations  Wheelchair (measurements PT);Wheelchair cushion (measurements PT)       Precautions / Restrictions Precautions Precautions: Fall Required Braces or Orthoses: Sling Restrictions Weight Bearing Restrictions: Yes LUE Weight Bearing: Non weight bearing Other Position/Activity Restrictions: No weight bearing status in order set; Will proceed as NWB LUE unless otherwise ordered    Mobility  Bed Mobility Overal bed mobility: Needs Assistance Bed Mobility: Supine to Sit     Supine to sit: HOB elevated;Total assist;+2 for physical assistance     General bed mobility comments: increased cuing to initiate movement of LE off bed, increased hip pain necessitated movement of LE together, decreased ability to use L back/shoulder for support due to pain necessitated 3 people to come to EoB safely    Transfers Overall transfer level: Needs assistance Equipment used: 2 person hand held assist Transfers: Sit to/from [facility] Sit to Stand: Mod assist;+2 safety/equipment         General transfer comment: modAx2 and power up from elevated bed position, pt able to perform 2x sit<>stand on second attempt requests to get to  chair  Ambulation/Gait Ambulation/Gait assistance: Mod assist;+2 physical assistance Gait Distance (Feet): 2 Feet Assistive device: 2 person hand held assist Gait Pattern/deviations: Shuffle     General Gait Details: pt able to take pivotal steps 2 feet from bed to recliner         Balance Overall balance assessment: Needs assistance   Sitting balance-Leahy Scale: Poor Sitting balance - Comments: RUE used to support self in sitting   Standing balance support: Bilateral upper extremity supported Standing balance-Leahy Scale: Poor Standing balance comment: modAx2 for safety                            Cognition Arousal/Alertness: Awake/alert Behavior During Therapy: Flat affect Overall Cognitive Status: Impaired/Different from baseline Area of Impairment: Attention;Memory;Following commands;Safety/judgement;Awareness;Problem solving                   Current Attention Level: Sustained Memory: Decreased short-term memory;Decreased recall of precautions Following Commands: Follows one step commands with increased time Safety/Judgement: Decreased awareness of safety;Decreased awareness of deficits Awareness: Intellectual Problem Solving: Slow processing;Decreased initiation;Difficulty sequencing;Requires verbal cues;Requires tactile cues General Comments: continues to be slow in response and processing commands, requiring maximal cuing      Exercises  sit<>standx2    General Comments General comments (skin integrity, edema, etc.): Pt continues to have poor understanding of his medical dx when instructed that sitting up in chair would be beneficial in helping to clear his PNA      Pertinent Vitals/Pain Pain Assessment: 0-10 Faces Pain Scale: Hurts whole lot Pain Location: L  shoulder/scapula, L knee Pain Descriptors / Indicators: Grimacing;Guarding;Constant Pain Intervention(s): Limited activity within patient's tolerance;Monitored during  session;Repositioned           PT Goals (current goals can now be found in the care plan section) Acute Rehab PT Goals PT Goal Formulation: With patient Time For Goal Achievement: 05/31/20 Potential to Achieve Goals: Fair Progress towards PT goals: Progressing toward goals    Frequency    Min 2X/week      PT Plan Current plan remains appropriate    Co-evaluation PT/OT/SLP Co-Evaluation/Treatment: Yes Reason for Co-Treatment: For patient/therapist safety;To address functional/ADL transfers PT goals addressed during session: Mobility/safety with mobility        AM-PAC PT "6 Clicks" Mobility   Outcome Measure  Help needed turning from your back to your side while in a flat bed without using bedrails?: A Little Help needed moving from lying on your back to sitting on the side of a flat bed without using bedrails?: A Lot Help needed moving to and from a bed to a chair (including a wheelchair)?: A Lot Help needed standing up from a chair using your arms (e.g., wheelchair or bedside chair)?: A Lot Help needed to walk in hospital room?: A Lot Help needed climbing 3-5 steps with a railing? : Total 6 Click Score: 12    End of Session Equipment Utilized During Treatment: Gait belt;Other (comment) (utilized sling for transfer) Activity Tolerance: Patient limited by pain;Patient limited by fatigue Patient left: in bed;with call bell/phone within reach;with bed alarm set Nurse Communication: Mobility status;Need for lift equipment;Other (comment) (need for lift pad) PT Visit Diagnosis: Unsteadiness on feet (R26.81);Other abnormalities of gait and mobility (R26.89);Repeated falls (R29.6);Muscle weakness (generalized) (M62.81);History of falling (Z91.81);Difficulty in walking, not elsewhere classified (R26.2);Pain Pain - Right/Left: Left Pain - part of body: Shoulder     Time: 3903-0092 PT Time Calculation (min) (ACUTE ONLY): 26 min  Charges:  $Therapeutic Activity: 8-22  mins                     Taran Hable B. Migdalia Dk PT, DPT Acute Rehabilitation Services Pager [phone] Office [phone]    [place] 05/23/2020, 10:47 AM

## 2020-05-23 NOTE — TOC Progression Note (Signed)
Transition of Care [facility]) - Progression Note    Patient Details  Name: [name] [name] MRN: [id number] Date of Birth: Jan 16, 1955  [facility]) CM/SW Contact  [name], LCSW Phone Number: 05/23/2020, 5:57 PM  Clinical Narrative:  CSW talked at the bedside with patient and his mother,[name] regarding SNF choice. SNF choice [facility] Call made to [facility] regarding patient and per Star, admissions director, they can take patient. Call made to [employer] and insurance auth request made. CSW will continue to follow and provide SW intervention services as needed through discharge.    Expected Discharge Plan: [place] Barriers to Discharge: Continued Medical Work up  Expected Discharge Plan and Services Expected Discharge Plan: [place] In-house Referral: Clinical Social Work Discharge Planning Services: CM Consult   Living arrangements for the past 2 months: Single Family Home Expected Discharge Date: 05/23/20                                   Social Determinants of Health (SDOH) Interventions  No SDOH interventions requested or needed at this time.  Readmission Risk Interventions No flowsheet data found.

## 2020-05-24 DIAGNOSIS — D649 Anemia, unspecified: Secondary | ICD-10-CM | POA: Diagnosis not present

## 2020-05-24 DIAGNOSIS — R5381 Other malaise: Secondary | ICD-10-CM | POA: Diagnosis not present

## 2020-05-24 DIAGNOSIS — F329 Major depressive disorder, single episode, unspecified: Secondary | ICD-10-CM | POA: Diagnosis not present

## 2020-05-24 DIAGNOSIS — E118 Type 2 diabetes mellitus with unspecified complications: Secondary | ICD-10-CM | POA: Diagnosis not present

## 2020-05-24 DIAGNOSIS — I10 Essential (primary) hypertension: Secondary | ICD-10-CM | POA: Diagnosis not present

## 2020-05-24 DIAGNOSIS — K5909 Other constipation: Secondary | ICD-10-CM | POA: Diagnosis not present

## 2020-05-24 DIAGNOSIS — S42109A Fracture of unspecified part of scapula, unspecified shoulder, initial encounter for closed fracture: Secondary | ICD-10-CM | POA: Diagnosis not present

## 2020-05-24 DIAGNOSIS — F411 Generalized anxiety disorder: Secondary | ICD-10-CM | POA: Diagnosis not present

## 2020-05-24 DIAGNOSIS — L8990 Pressure ulcer of unspecified site, unspecified stage: Secondary | ICD-10-CM | POA: Insufficient documentation

## 2020-05-24 DIAGNOSIS — E1165 Type 2 diabetes mellitus with hyperglycemia: Secondary | ICD-10-CM | POA: Diagnosis not present

## 2020-05-24 DIAGNOSIS — D6489 Other specified anemias: Secondary | ICD-10-CM | POA: Diagnosis not present

## 2020-05-24 DIAGNOSIS — E871 Hypo-osmolality and hyponatremia: Secondary | ICD-10-CM | POA: Diagnosis not present

## 2020-05-24 DIAGNOSIS — Z794 Long term (current) use of insulin: Secondary | ICD-10-CM | POA: Diagnosis not present

## 2020-05-24 DIAGNOSIS — J690 Pneumonitis due to inhalation of food and vomit: Secondary | ICD-10-CM | POA: Diagnosis not present

## 2020-05-24 DIAGNOSIS — R531 Weakness: Secondary | ICD-10-CM | POA: Diagnosis not present

## 2020-05-24 DIAGNOSIS — R2681 Unsteadiness on feet: Secondary | ICD-10-CM | POA: Diagnosis not present

## 2020-05-24 DIAGNOSIS — I4891 Unspecified atrial fibrillation: Secondary | ICD-10-CM | POA: Diagnosis not present

## 2020-05-24 DIAGNOSIS — S42132A Displaced fracture of coracoid process, left shoulder, initial encounter for closed fracture: Secondary | ICD-10-CM | POA: Diagnosis not present

## 2020-05-24 DIAGNOSIS — F5102 Adjustment insomnia: Secondary | ICD-10-CM | POA: Diagnosis not present

## 2020-05-24 DIAGNOSIS — S42102D Fracture of unspecified part of scapula, left shoulder, subsequent encounter for fracture with routine healing: Secondary | ICD-10-CM | POA: Diagnosis not present

## 2020-05-24 DIAGNOSIS — Z9181 History of falling: Secondary | ICD-10-CM | POA: Diagnosis not present

## 2020-05-24 DIAGNOSIS — E785 Hyperlipidemia, unspecified: Secondary | ICD-10-CM | POA: Diagnosis not present

## 2020-05-24 DIAGNOSIS — M25522 Pain in left elbow: Secondary | ICD-10-CM | POA: Diagnosis not present

## 2020-05-24 DIAGNOSIS — F17200 Nicotine dependence, unspecified, uncomplicated: Secondary | ICD-10-CM | POA: Diagnosis not present

## 2020-05-24 DIAGNOSIS — S42102A Fracture of unspecified part of scapula, left shoulder, initial encounter for closed fracture: Secondary | ICD-10-CM | POA: Diagnosis not present

## 2020-05-24 DIAGNOSIS — I480 Paroxysmal atrial fibrillation: Secondary | ICD-10-CM | POA: Diagnosis not present

## 2020-05-24 DIAGNOSIS — M6281 Muscle weakness (generalized): Secondary | ICD-10-CM | POA: Diagnosis not present

## 2020-05-24 DIAGNOSIS — Z743 Need for continuous supervision: Secondary | ICD-10-CM | POA: Diagnosis not present

## 2020-05-24 DIAGNOSIS — M1990 Unspecified osteoarthritis, unspecified site: Secondary | ICD-10-CM | POA: Diagnosis not present

## 2020-05-24 DIAGNOSIS — R2689 Other abnormalities of gait and mobility: Secondary | ICD-10-CM | POA: Diagnosis not present

## 2020-05-24 DIAGNOSIS — J90 Pleural effusion, not elsewhere classified: Secondary | ICD-10-CM | POA: Diagnosis not present

## 2020-05-24 DIAGNOSIS — R41841 Cognitive communication deficit: Secondary | ICD-10-CM | POA: Diagnosis not present

## 2020-05-24 DIAGNOSIS — R739 Hyperglycemia, unspecified: Secondary | ICD-10-CM | POA: Diagnosis not present

## 2020-05-24 DIAGNOSIS — L89150 Pressure ulcer of sacral region, unstageable: Secondary | ICD-10-CM | POA: Diagnosis not present

## 2020-05-24 DIAGNOSIS — R0902 Hypoxemia: Secondary | ICD-10-CM | POA: Diagnosis not present

## 2020-05-24 DIAGNOSIS — N401 Enlarged prostate with lower urinary tract symptoms: Secondary | ICD-10-CM | POA: Diagnosis not present

## 2020-05-24 DIAGNOSIS — I499 Cardiac arrhythmia, unspecified: Secondary | ICD-10-CM | POA: Diagnosis not present

## 2020-05-24 DIAGNOSIS — M898X1 Other specified disorders of bone, shoulder: Secondary | ICD-10-CM | POA: Diagnosis not present

## 2020-05-24 DIAGNOSIS — I2510 Atherosclerotic heart disease of native coronary artery without angina pectoris: Secondary | ICD-10-CM | POA: Diagnosis not present

## 2020-05-24 DIAGNOSIS — M76891 Other specified enthesopathies of right lower limb, excluding foot: Secondary | ICD-10-CM | POA: Diagnosis not present

## 2020-05-24 DIAGNOSIS — R262 Difficulty in walking, not elsewhere classified: Secondary | ICD-10-CM | POA: Diagnosis not present

## 2020-05-24 DIAGNOSIS — E1042 Type 1 diabetes mellitus with diabetic polyneuropathy: Secondary | ICD-10-CM | POA: Diagnosis not present

## 2020-05-24 DIAGNOSIS — F330 Major depressive disorder, recurrent, mild: Secondary | ICD-10-CM | POA: Diagnosis not present

## 2020-05-24 DIAGNOSIS — S42109D Fracture of unspecified part of scapula, unspecified shoulder, subsequent encounter for fracture with routine healing: Secondary | ICD-10-CM | POA: Diagnosis not present

## 2020-05-24 DIAGNOSIS — L89152 Pressure ulcer of sacral region, stage 2: Secondary | ICD-10-CM | POA: Diagnosis not present

## 2020-05-24 DIAGNOSIS — F338 Other recurrent depressive disorders: Secondary | ICD-10-CM | POA: Diagnosis not present

## 2020-05-24 LAB — CULTURE, BODY FLUID W GRAM STAIN -BOTTLE: Culture: NO GROWTH

## 2020-05-24 LAB — RESP PANEL BY RT-PCR (FLU A&B, COVID) ARPGX2
Influenza A by PCR: NEGATIVE
Influenza B by PCR: NEGATIVE
SARS Coronavirus 2 by RT PCR: NEGATIVE

## 2020-05-24 LAB — CULTURE, BLOOD (ROUTINE X 2)
Culture: NO GROWTH
Culture: NO GROWTH
Special Requests: ADEQUATE
Special Requests: ADEQUATE

## 2020-05-24 LAB — GLUCOSE, CAPILLARY
Glucose-Capillary: 92 mg/dL (ref 70–99)
Glucose-Capillary: 165 mg/dL — ABNORMAL HIGH (ref 70–99)
Glucose-Capillary: 187 mg/dL — ABNORMAL HIGH (ref 70–99)

## 2020-05-24 NOTE — TOC Transition Note (Addendum)
Transition of Care [facility]) - CM/SW Discharge Note *Discharged to [facility] and [place] *Number for Report: [phone]   Patient Details  Name: Kenya Shiraishi MRN: [id number] Date of Birth: July 05, 1955  Transition of Care [facility]) CM/SW Contact:  Sable Feil, LCSW Phone Number: 05/24/2020, 3:08 PM   Clinical: Patient medically stable for discharge and going to [facility] [place] rehab. Patient's mother-Jewel Sheppard Coil [phone]) contacted and updated. CSW also asked admissions director Star to contact Ms. Alexander, as she had questions. Discharge clinicals transmitted to facility and nurse provided with information to call report.     6:30 pm: Patient's mother contacted and informed that transport was here at the hospital to transport her son to [place] [facility]     Final next level of care: [facility] - Room [phone]) Barriers to Discharge: Barriers Resolved   Patient Goals and CMS Choice Patient states their goals for this hospitalization and ongoing recovery are:: Patient agreeable to ST rehab before returning home CMS Medicare.gov Compare Post Acute Care list provided to:: Patient Represenative (must comment) Choice offered to / list presented to : Patient  Discharge Placement PASRR number recieved: 05/22/20            Patient chooses bed at: [facility] Patient to be transferred to facility by: Non-emergency ambulance transport Name of family member notified: Mother - Fay Records - [phone] Patient and family notified of of transfer: 05/24/20  Discharge Plan and Services In-house Referral: Clinical Social Work Discharge Planning Services: CM Consult                                Social Determinants of Health (SDOH) Interventions  No SDOH interventions requested or needed at discharge.   Readmission Risk Interventions No flowsheet data found.

## 2020-05-24 NOTE — Progress Notes (Signed)
Report given to Tyler Pita LPN at [facility]

## 2020-05-24 NOTE — Care Management Important Message (Signed)
Important Message  Patient Details  Name: [name] [name] MRN: [id number] Date of Birth: 1955-04-14   Medicare Important Message Given:  Yes     [name] 05/24/2020, 1:50 PM

## 2020-05-24 NOTE — Discharge Summary (Addendum)
Physician Discharge Summary  [name] [name] YJE:[id number] DOB: 1955-03-19 DOA: 05/16/2020  PCP: Janith Lima, MD  Admit date: 05/16/2020 Discharge date: 05/24/2020  Admitted From: Home Disposition:  SNF  Recommendations for Outpatient Follow-up:  1. Follow up with PCP in 1-2 weeks 2. Please obtain BMP/CBC in one week 3. Follow-up with cardiology on 05/26/2020 check a hemoglobin if his hemoglobin is stable can resume Eliquis patient had been refusing labs in house.   Home Health:No Equipment/Devices:None  Discharge Condition:Stable CODE STATUS:Full Diet recommendation: Heart Healthy   Brief/Interim Summary: 65 y.o. male past medical history of atrial fibrillation on Eliquis, uncontrolled insulin-dependent diabetes mellitus type 2, CAD status post CABG times 01/2001 came into the ED after falling approximately 17 stairs after extensive radiologic evaluation he was diagnosed with a left scapular fracture and coracoid fracture. He was initially admitted to the trauma service and Dr. Lorin Mercy recommended nonoperative management. Work-up is underway for bilateral pleural effusion Eliquis was held due to decreasing hemoglobin and effusions.stay  Discharge Diagnoses:  Principal Problem:   Hypoxia Active Problems:   Uncontrolled type 2 diabetes mellitus with hyperglycemia (HCC)   Coronary artery disease   Hypertension   Anemia   PAF (paroxysmal atrial fibrillation) ([place])   Fall (on) (from) other stairs and steps, initial encounter   Closed left scapular fracture   Pressure injury of skin  Left scapular and coracoid fracture: Secondary to mechanical fall orthopedic surgery was consulted recommended conservative management with a sling. Physical therapy evaluated the patient and recommended skilled nursing facility.  Bilateral pleural effusion: Anticoagulation was held, 2D echo was done that showed an EF of 55%. There was mild drop in his hemoglobin thoracentesis was performed as  it was a concern of hemothorax and thoracocentesis you have 1.3 L of yellow fluid no signs of bleeding. Over the last several days here in the hospital he refused certain medications especially his Eliquis, he also refused labs we did explain to him the risk and benefits in doing that and he understands. Has capacity to make his own medical decisions. He still refusing labs as he does not want them drawn anymore and here in house, he relates he will have them drawn with his PCP as an outpatient. His Eliquis was discontinued until can be rechecked this could be done as an outpatient.  Possible aspiration pneumonia: Chest x-ray was done that shows an infiltrate he was coughing with eating he was started on IV Unasyn and completed 5-day in-house he will continue Augmentin as an outpatient.  CAD with a history of CABG in 2003: No anginal symptoms, initially statin and ACE inhibitor were held, but they will be resumed as an outpatient. He was continue with aspirin. He has an appointment with cardiology as an outpatient scheduled on 05/26/2020. It is to note that his Eliquis was held as the patient was refusing labs in house and we did not know where his hemoglobin was either trending up or down, the risk of refusing labs to the patient were explained and he seems to understand the risk and benefits of doing so. His Eliquis was held and can be resumed as an outpatient once his hemoglobin is rechecked.  Paroxysmal atrial fibrillation: Rate controlled follow-up with cardiology as an outpatient.  Lower extremity swelling: Lower extremity Doppler was negative for DVT.  Normocytic anemia: There was a drop in hemoglobin from 13-9 in the setting of Eliquis and trauma. His Eliquis was held on admission he had no bloody bowel movements or  signs of overt bleeding. A chest x-ray showed pleural effusion thoracocentesis was done the yield 1.3 L of yellow fluid no microscopic blood on Lasix. CT of the chest on  admission showed no acute fractures. We will follow-up as an outpatient with PCP.  Poorly controlled diabetes mellitus type 2 : with an A1c of 12, he reports he stopped his Lantus at night due to low blood glucose, in the hospital he was On long-acting insulin plus sliding scale and his blood glucose remained relatively stable.  Discharge Instructions  Discharge Instructions    Diet - low sodium heart healthy   Complete by: As directed    Increase activity slowly   Complete by: As directed      Allergies as of 05/24/2020   No Known Allergies     Medication List    STOP taking these medications   apixaban 5 MG Tabs tablet Commonly known as: ELIQUIS     TAKE these medications   amoxicillin-clavulanate 875-125 MG tablet Commonly known as: Augmentin Take 1 tablet by mouth 2 (two) times daily for 2 days.   aspirin EC 325 MG tablet Take 325 mg by mouth daily.   atorvastatin 40 MG tablet Commonly known as: LIPITOR Take 1 tablet (40 mg total) by mouth daily.   B COMPLEX-B12 PO Take 1 tablet by mouth daily.   Droplet Pen Needles 32G X 4 MM Misc Generic drug: Insulin Pen Needle Use daily for glucose control   FreeStyle Libre 14 Day Reader Kerrin Mo 1 each by Does not apply route daily.   FreeStyle Libre 14 Day Sensor Misc 1 each by Does not apply route daily.   HYDROcodone-acetaminophen 5-325 MG tablet Commonly known as: NORCO/VICODIN Take 1-2 tablets by mouth every 6 (six) hours as needed for up to 3 days for moderate pain.   Insulin Aspart FlexPen 100 UNIT/ML Sopn Inject 10-20 Units into the skin See admin instructions. Injects10-20 units into the skin three times daily per sliding scale. What changed:   how much to take  additional instructions   insulin glargine 100 UNIT/ML injection Commonly known as: LANTUS Inject 0.5 mLs (50 Units total) into the skin at bedtime.   lisinopril 5 MG tablet Commonly known as: ZESTRIL TAKE 1 TABLET BY MOUTH DAILY    mirtazapine 15 MG tablet Commonly known as: Remeron Take 1 tablet (15 mg total) by mouth at bedtime.   PARoxetine 20 MG tablet Commonly known as: PAXIL Take 1 tablet (20 mg total) by mouth daily.   traZODone 50 MG tablet Commonly known as: DESYREL Take 1 tablet (50 mg total) by mouth at bedtime.   Vitamin D 125 MCG (5000 UT) Caps Take 5,000 Units by mouth daily.            Durable Medical Equipment  (From admission, onward)         Start     Ordered   05/20/20 0944  For home use only DME 3 n 1  Once        05/20/20 0944          Follow-up Information    Marybelle Killings, MD Follow up in 1 week(s).   Specialty: Orthopedic Surgery Contact information: [address] [place] [address] [phone]              No Known Allergies  Consultations:  Orthopedic surgery   Procedures/Studies: DG Chest 1 View  Result Date: 05/19/2020 CLINICAL DATA:  Status post left-sided thoracentesis. EXAM: CHEST  1  VIEW COMPARISON:  Chest CT 05/16/2020 FINDINGS: Interval evacuation of the left pleural effusion. No postprocedural pneumothorax. Suspect persistent layering right pleural effusion with hazy opacity of the right hemithorax. IMPRESSION: Interval evacuation of left pleural effusion without postprocedural pneumothorax. Electronically Signed   By: Marijo Sanes M.D.   On: 05/19/2020 10:40   DG Pelvis 1-2 Views  Result Date: 05/16/2020 CLINICAL DATA:  Left hip pain after fall today. EXAM: PELVIS - 1-2 VIEW COMPARISON:  None. FINDINGS: There is no evidence of pelvic fracture or diastasis. No pelvic bone lesions are seen. IMPRESSION: Negative. Electronically Signed   By: Marijo Conception M.D.   On: 05/16/2020 11:30   CT Head Wo Contrast  Result Date: 05/16/2020 CLINICAL DATA:  Fall down stairs EXAM: CT HEAD WITHOUT CONTRAST TECHNIQUE: Contiguous axial images were obtained from the base of the skull through the vertex without intravenous contrast. COMPARISON:  None.  FINDINGS: Brain: No acute intracranial abnormality. Specifically, no hemorrhage, hydrocephalus, mass lesion, acute infarction, or significant intracranial injury. Vascular: No hyperdense vessel or unexpected calcification. Skull: No acute calvarial abnormality. Sinuses/Orbits: No acute findings Other: None IMPRESSION: No acute intracranial abnormality. Electronically Signed   By: Rolm Baptise M.D.   On: 05/16/2020 11:26   CT Chest Wo Contrast  Result Date: 05/16/2020 CLINICAL DATA:  Left chest and scapular pain after fall down stairs EXAM: CT CHEST WITHOUT CONTRAST TECHNIQUE: Multidetector CT imaging of the chest was performed following the standard protocol without IV contrast. COMPARISON:  Chest x-ray 12/07/2019 FINDINGS: Cardiovascular: Heart size is normal. No pericardial effusion. Thoracic aorta is nonaneurysmal. There are atherosclerotic calcifications of the aorta and coronary arteries. Patient is status post CABG. Mediastinum/Nodes: No axillary, mediastinal, or hilar lymphadenopathy evident on non contrasted exam. Thyroid, trachea, and esophagus appear within normal limits. Lungs/Pleura: Moderate bilateral pleural effusions with associated compressive atelectasis. No pneumothorax. Upper Abdomen: No acute abnormality. Musculoskeletal: Acute mildly comminuted and relatively nondisplaced fracture of the left scapular spine. Acute fracture of the superior border of the left coracoid process which is superiorly displaced by approximately 11 mm (series 6, image 168). Acromioclavicular and glenohumeral joint alignment appears maintained without dislocation. No acute rib fracture. Thoracic vertebral body heights and alignment are maintained without evidence of fracture. Soft tissue edema and small amount of hemorrhage at the fracture sites. No well-defined hematoma. IMPRESSION: 1. Acute fractures of the left scapula including a mildly comminuted and relatively nondisplaced fracture of the left scapular spine.  Acute fracture of the superior border of the coracoid process which is superiorly displaced by approximately 11 mm. 2. Negative for rib fracture. 3. Moderate bilateral pleural effusions with associated compressive atelectasis. No pneumothorax. Aortic Atherosclerosis (ICD10-I70.0). Electronically Signed   By: Davina Poke D.O.   On: 05/16/2020 11:59   CT Cervical Spine Wo Contrast  Result Date: 05/16/2020 CLINICAL DATA:  Fall down stairs. EXAM: CT CERVICAL SPINE WITHOUT CONTRAST TECHNIQUE: Multidetector CT imaging of the cervical spine was performed without intravenous contrast. Multiplanar CT image reconstructions were also generated. COMPARISON:  None. FINDINGS: Alignment: Normal Skull base and vertebrae: No acute fracture. No primary bone lesion or focal pathologic process. Soft tissues and spinal canal: No prevertebral fluid or swelling. No visible canal hematoma. Disc levels: Degenerative disc disease at C5-6 and C6-7 with disc space narrowing and spurring. Bilateral degenerative facet disease, left greater than right. Upper chest: Large bilateral pleural effusions partially imaged. Other: None IMPRESSION: Cervical spondylosis.  No acute bony abnormality. Large bilateral pleural effusions. Electronically Signed  By: Rolm Baptise M.D.   On: 05/16/2020 11:27   CT T-SPINE NO CHARGE  Result Date: 05/16/2020 CLINICAL DATA:  Fall down stairs. EXAM: CT THORACIC SPINE WITHOUT CONTRAST TECHNIQUE: Multidetector CT images of the thoracic were obtained using the standard protocol without intravenous contrast. COMPARISON:  None. FINDINGS: Alignment: Normal. Vertebrae: No definite acute fracture is identified. Curvilinear lucency in the left T3 transverse process appears partially corticated and is favored to reflect a vascular channel rather than nondisplaced fracture with less conspicuous suspected vascular channels and/or artifact seen in other transverse processes including on the right at T3. Paraspinal and  other soft tissues: No acute abnormality identified in the paraspinal soft tissues. Intrathoracic contents reported separately. Disc levels: Mild thoracic spondylosis without evidence of a high-grade stenosis. IMPRESSION: No acute osseous abnormality identified in the thoracic spine. Electronically Signed   By: Logan Bores M.D.   On: 05/16/2020 11:40   DG Chest Port 1 View  Result Date: 05/20/2020 CLINICAL DATA:  Fever, fall EXAM: PORTABLE CHEST 1 VIEW COMPARISON:  05/19/2020 FINDINGS: Pulmonary insufflation is stable. Moderate bibasilar pulmonary infiltrate has developed, infection versus aspiration. Small right pleural effusion is suspected. No pneumothorax. Coronary artery bypass grafting has been performed. Cardiac size within normal limits. Pulmonary vascularity is normal. No acute bone abnormality. IMPRESSION: Interval development of moderate bibasilar pulmonary infiltrate, infection versus aspiration. Associated small right parapneumonic effusion. Electronically Signed   By: Fidela Salisbury MD   On: 05/20/2020 06:58   DG Shoulder Left  Result Date: 05/16/2020 CLINICAL DATA:  Fall down stairs.  Left shoulder pain. EXAM: LEFT SHOULDER - 2+ VIEW COMPARISON:  None. FINDINGS: There is a fracture through the left scapula at the base of the acromion, minimal displacement. Degenerative changes in the [facility] joint. Visualized humerus unremarkable. No dislocation. IMPRESSION: Scapular fracture at the base of the left acromion. Electronically Signed   By: Rolm Baptise M.D.   On: 05/16/2020 11:25   DG Knee Complete 4 Views Left  Result Date: 05/16/2020 CLINICAL DATA:  Left knee swelling after fall today. EXAM: LEFT KNEE - COMPLETE 4+ VIEW COMPARISON:  None. FINDINGS: No evidence of fracture, dislocation, or joint effusion. No evidence of arthropathy or other focal bone abnormality. Soft tissues are unremarkable. IMPRESSION: Negative. Electronically Signed   By: Marijo Conception M.D.   On: 05/16/2020 11:29   DG  Humerus Left  Result Date: 05/16/2020 CLINICAL DATA:  Left shoulder pain after fall down stairs today. EXAM: LEFT HUMERUS - 2+ VIEW COMPARISON:  None. FINDINGS: Mildly displaced fracture is seen involving the left acromion. Visualized humerus is unremarkable. IMPRESSION: Mildly displaced left acromial fracture. Electronically Signed   By: Marijo Conception M.D.   On: 05/16/2020 11:28   VAS [place] ABI WITH/WO TBI  Result Date: 05/05/2020  LOWER EXTREMITY DOPPLER STUDY Patient Name:  [name] [name]  Date of Exam:   05/05/2020 Medical Rec #: [id number]             Accession #:    [id number] Date of Birth: [birth year]              Patient Gender: M Patient Age:   65Y Exam Location:  [facility] Procedure:      VAS [place] ABI WITH/WO TBI Referring Phys: [place] --------------------------------------------------------------------------------  Indications: Claudication. High Risk Factors: Hypertension, hyperlipidemia, Diabetes, current smoker,                    coronary  artery disease.  Vascular Interventions: History of CABG (with saphenous vein and left radial                         artery). Performing Technologist: Delorise Shiner RVT  Examination Guidelines: A complete evaluation includes at minimum, Doppler waveform signals and systolic blood pressure reading at the level of bilateral brachial, anterior tibial, and posterior tibial arteries, when vessel segments are accessible. Bilateral testing is considered an integral part of a complete examination. Photoelectric Plethysmograph (PPG) waveforms and toe systolic pressure readings are included as required and additional duplex testing as needed. Limited examinations for reoccurring indications may be performed as noted.  ABI Findings: +---------+------------------+-----+----------+--------+ Right    Rt Pressure (mmHg)IndexWaveform  Comment  +---------+------------------+-----+----------+--------+ Brachial 149                                        +---------+------------------+-----+----------+--------+ ATA      139               0.93                    +---------+------------------+-----+----------+--------+ PTA      162               1.09 triphasic          +---------+------------------+-----+----------+--------+ DP                              monophasic         +---------+------------------+-----+----------+--------+ Great Toe97                0.65                    +---------+------------------+-----+----------+--------+ +---------+------------------+-----+----------+-------+ Left     Lt Pressure (mmHg)IndexWaveform  Comment +---------+------------------+-----+----------+-------+ Brachial 143                                      +---------+------------------+-----+----------+-------+ ATA      110               0.74                   +---------+------------------+-----+----------+-------+ PTA      131               0.88 monophasic        +---------+------------------+-----+----------+-------+ DP                              monophasic        +---------+------------------+-----+----------+-------+ Great Toe66                0.44                   +---------+------------------+-----+----------+-------+ +-------+-----------+-----------+------------+------------+ ABI/TBIToday's ABIToday's TBIPrevious ABIPrevious TBI +-------+-----------+-----------+------------+------------+ Right  1.09       0.65                                +-------+-----------+-----------+------------+------------+ Left   0.88       0.44                                +-------+-----------+-----------+------------+------------+  Summary: Right: Resting right ankle-brachial index is within normal range. No evidence of significant right lower extremity arterial disease. The right toe-brachial index is abnormal. RT great toe pressure = 97 mmHg. Left: Resting left ankle-brachial index indicates  moderate left lower extremity arterial disease. The left toe-brachial index is abnormal. LT Great toe pressure = 66 mmHg.  *See table(s) above for measurements and observations.  Electronically signed by Ruta Hinds MD on 05/05/2020 at 11:40:21 AM.    Final    ECHOCARDIOGRAM COMPLETE BUBBLE STUDY  Result Date: 05/17/2020    ECHOCARDIOGRAM REPORT   Patient Name:   [name] [name] Date of Exam: 05/17/2020 Medical Rec #:  [id number]            Height:       74.0 in Accession #:    [id number]           Weight:       175.0 lb Date of Birth:  25-Jun-1955             BSA:          2.054 m Patient Age:    63 years             BP:           107/72 mmHg Patient Gender: M                    HR:           72 bpm. Exam Location:  Inpatient Procedure: 2D Echo, Cardiac Doppler, Color Doppler and Saline Contrast Bubble            Study Indications:    R06.02 SOB  History:        Patient no has prior history of Echocardiogram examinations.                 CAD, Signs/Symptoms:Shortness of Breath; Risk                 Factors:Hypertension, Dyslipidemia and Diabetes.  Sonographer:    Bernadene Person RDCS Referring Phys: [place]  Sonographer Comments: Pt unable to turn on left side. IMPRESSIONS  1. Left ventricular ejection fraction, by estimation, is 55 to 60%. The left ventricle has normal function. The left ventricle has no regional wall motion abnormalities. Left ventricular diastolic parameters are indeterminate.  2. Right ventricular systolic function is low normal. The right ventricular size is normal. There is mildly elevated pulmonary artery systolic pressure.  3. Moderate pleural effusion in the left lateral region.  4. The mitral valve is grossly normal. No evidence of mitral valve regurgitation.  5. The aortic valve is tricuspid. There is moderate thickening of the aortic valve. Aortic valve regurgitation is not visualized.  6. There is borderline dilatation of the aortic root, measuring 38 mm. There is  borderline dilatation of the ascending aorta, measuring 39 mm. These are within normal limits for age and BSA.  7. Agitated saline contrast bubble study was negative, with no evidence of any interatrial shunt. Comparison(s): No prior Echocardiogram. FINDINGS  Left Ventricle: Left ventricular ejection fraction, by estimation, is 55 to 60%. The left ventricle has normal function. The left ventricle has no regional wall motion abnormalities. The left ventricular internal cavity size was normal in size. There is  no concentric left ventricular hypertrophy of the basal-septal segment. Left ventricular diastolic parameters are indeterminate. Right Ventricle: The right ventricular size is normal. No increase in right ventricular wall thickness.  Right ventricular systolic function is low normal. There is mildly elevated pulmonary artery systolic pressure. The tricuspid regurgitant velocity is 3.13 m/s, and with an assumed right atrial pressure of 3 mmHg, the estimated right ventricular systolic pressure is 87.5 mmHg. Left Atrium: Left atrial size was normal in size. Right Atrium: Right atrial size was normal in size. Pericardium: There is no evidence of pericardial effusion. Mitral Valve: The mitral valve is grossly normal. No evidence of mitral valve regurgitation. Tricuspid Valve: The tricuspid valve is not well visualized. Tricuspid valve regurgitation is not demonstrated. Aortic Valve: The aortic valve is tricuspid. There is moderate thickening of the aortic valve. Aortic valve regurgitation is not visualized. Pulmonic Valve: Discrepancy betweeen color and Spectral Doppler. The pulmonic valve was normal in structure. Pulmonic valve regurgitation is not visualized. No evidence of pulmonic stenosis. Aorta: Aortic dilatation noted. There is borderline dilatation of the aortic root, measuring 38 mm. There is borderline dilatation of the ascending aorta, measuring 39 mm. IAS/Shunts: The atrial septum is grossly normal.  Agitated saline contrast was given intravenously to evaluate for intracardiac shunting. Agitated saline contrast bubble study was negative, with no evidence of any interatrial shunt. Additional Comments: There is a moderate pleural effusion in the left lateral region.  LEFT VENTRICLE PLAX 2D LVIDd:         4.20 cm LVIDs:         2.40 cm LV PW:         2.50 cm LV IVS:        1.10 cm LVOT diam:     2.10 cm LV SV:         56 LV SV Index:   27 LVOT Area:     3.46 cm  RIGHT VENTRICLE TAPSE (M-mode): 1.0 cm LEFT ATRIUM           Index       RIGHT ATRIUM           Index LA diam:      4.00 cm 1.95 cm/m  RA Area:     13.20 cm LA Vol (A2C): 35.5 ml 17.29 ml/m RA Volume:   36.20 ml  17.63 ml/m LA Vol (A4C): 39.8 ml 19.38 ml/m  AORTIC VALVE LVOT Vmax:   67.20 cm/s LVOT Vmean:  55.700 cm/s LVOT VTI:    0.163 m  AORTA Ao Root diam: 3.80 cm Ao Asc diam:  3.90 cm TRICUSPID VALVE TR Peak grad:   39.2 mmHg TR Vmax:        313.00 cm/s  SHUNTS Systemic VTI:  0.16 m Systemic Diam: 2.10 cm Rudean Haskell MD Electronically signed by Rudean Haskell MD Signature Date/Time: 05/17/2020/4:21:06 PM    Final    VAS [place] LOWER EXTREMITY VENOUS (DVT)  Result Date: 05/17/2020  Lower Venous DVT Study Patient Name:  [name] [name]  Date of Exam:   05/17/2020 Medical Rec #: [id number]             Accession #:    [id number] Date of Birth: 1955-05-12              Patient Gender: M Patient Age:   65Y Exam Location:  [facility] Procedure:      VAS [place] LOWER EXTREMITY VENOUS (DVT) Referring Phys: [id number] EKTA V PATEL --------------------------------------------------------------------------------  Indications: Edema.  Anticoagulation: Recently d/c Eliquis. Comparison Study: No prior venous studies. Performing Technologist: Darlin Coco RDMS,RVT  Examination Guidelines: A complete evaluation includes B-mode imaging, spectral Doppler, color Doppler, and power Doppler  as needed of all accessible portions of each vessel. Bilateral  testing is considered an integral part of a complete examination. Limited examinations for reoccurring indications may be performed as noted. The reflux portion of the exam is performed with the patient in reverse Trendelenburg.  +---------+---------------+---------+-----------+----------+--------------+ RIGHT    CompressibilityPhasicitySpontaneityPropertiesThrombus Aging +---------+---------------+---------+-----------+----------+--------------+ CFV      Full           Yes      Yes                                 +---------+---------------+---------+-----------+----------+--------------+ SFJ      Full                                                        +---------+---------------+---------+-----------+----------+--------------+ FV Prox  Full                                                        +---------+---------------+---------+-----------+----------+--------------+ FV Mid   Full                                                        +---------+---------------+---------+-----------+----------+--------------+ FV DistalFull                                                        +---------+---------------+---------+-----------+----------+--------------+ PFV      Full                                                        +---------+---------------+---------+-----------+----------+--------------+ POP      Full           Yes      Yes                                 +---------+---------------+---------+-----------+----------+--------------+ PTV      Full                                                        +---------+---------------+---------+-----------+----------+--------------+ PERO     Full                                                        +---------+---------------+---------+-----------+----------+--------------+   +---------+---------------+---------+-----------+----------+--------------+  LEFT      CompressibilityPhasicitySpontaneityPropertiesThrombus Aging +---------+---------------+---------+-----------+----------+--------------+ CFV      Full           Yes      Yes                                 +---------+---------------+---------+-----------+----------+--------------+ SFJ      Full                                                        +---------+---------------+---------+-----------+----------+--------------+ FV Prox  Full                                                        +---------+---------------+---------+-----------+----------+--------------+ FV Mid   Full                                                        +---------+---------------+---------+-----------+----------+--------------+ FV DistalFull                                                        +---------+---------------+---------+-----------+----------+--------------+ PFV      Full                                                        +---------+---------------+---------+-----------+----------+--------------+ POP      Full           Yes      Yes                                 +---------+---------------+---------+-----------+----------+--------------+ PTV      Full                                                        +---------+---------------+---------+-----------+----------+--------------+ PERO     Full                                                        +---------+---------------+---------+-----------+----------+--------------+     Summary: RIGHT: - There is no evidence of deep vein thrombosis in the lower extremity.  - No cystic structure found in the popliteal fossa.  LEFT: - There is no evidence of deep vein thrombosis in the lower extremity.  - No  cystic structure found in the popliteal fossa.  *See table(s) above for measurements and observations. Electronically signed by Deitra Mayo MD on 05/17/2020 at 1:07:23 PM.    Final    IR THORACENTESIS  ASP PLEURAL SPACE W/IMG GUIDE  Result Date: 05/19/2020 INDICATION: Patient with history of atrial fibrillation on Eliquis, diabetes, coronary artery disease with prior CABG, recent fall at home with left scapular fracture and coracoid fracture, bilateral pleural effusions; request received for diagnostic and therapeutic left thoracentesis. EXAM: ULTRASOUND GUIDED DIAGNOSTIC AND THERAPEUTIC LEFT  THORACENTESIS MEDICATIONS: 1% lidocaine to skin/SQ tissue COMPLICATIONS: None immediate. PROCEDURE: An ultrasound guided thoracentesis was thoroughly discussed with the patient and questions answered. The benefits, risks, alternatives and complications were also discussed. The patient understands and wishes to proceed with the procedure. Written consent was obtained. Ultrasound was performed to localize and mark an adequate pocket of fluid in the left chest. The area was then prepped and draped in the normal sterile fashion. 1% Lidocaine was used for local anesthesia. Under ultrasound guidance a 6 Fr Safe-T-Centesis catheter was introduced. Thoracentesis was performed. The catheter was removed and a dressing applied. FINDINGS: A total of approximately 1.3 liters of yellow fluid was removed. Samples were sent to the laboratory as requested by the clinical team. IMPRESSION: Successful ultrasound guided diagnostic and therapeutic left thoracentesis yielding 1.3 liters of pleural fluid. Read by: Rowe Robert, PA-C Electronically Signed   By: Corrie Mckusick D.O.   On: 05/19/2020 10:05      Subjective: No complaints  Discharge Exam: Vitals:   05/24/20 0421 05/24/20 0921  BP: 121/73 118/66  Pulse: (!) 59 61  Resp: 19 20  Temp: 99 F (37.2 C) 98.8 F (37.1 C)  SpO2: 93% 96%   Vitals:   05/23/20 1729 05/23/20 2058 05/24/20 0421 05/24/20 0921  BP: 136/78 111/73 121/73 118/66  Pulse: 64 61 (!) 59 61  Resp: 17 17 19 20   Temp: 98.8 F (37.1 C) 99.5 F (37.5 C) 99 F (37.2 C) 98.8 F (37.1 C)  TempSrc: Oral  Oral Oral Oral  SpO2: 95% 95% 93% 96%  Weight:  79.4 kg    Height:        General: Pt is alert, awake, not in acute distress Cardiovascular: RRR, S1/S2 +, no rubs, no gallops Respiratory: CTA bilaterally, no wheezing, no rhonchi Abdominal: Soft, NT, ND, bowel sounds + Extremities: no edema, no cyanosis    The results of significant diagnostics from this hospitalization (including imaging, microbiology, ancillary and laboratory) are listed below for reference.     Microbiology: Recent Results (from the past 240 hour(s))  Resp Panel by RT-PCR (Flu A&B, Covid) Nasopharyngeal Swab     Status: None   Collection Time: 05/16/20  1:06 PM   Specimen: Nasopharyngeal Swab; Nasopharyngeal(NP) swabs in vial transport medium  Result Value Ref Range Status   SARS Coronavirus 2 by RT PCR NEGATIVE NEGATIVE Final    Comment: (NOTE) SARS-CoV-2 target nucleic acids are NOT DETECTED.  The SARS-CoV-2 RNA is generally detectable in upper respiratory specimens during the acute phase of infection. The lowest concentration of SARS-CoV-2 viral copies this assay can detect is 138 copies/mL. A negative result does not preclude SARS-Cov-2 infection and should not be used as the sole basis for treatment or other patient management decisions. A negative result may occur with  improper specimen collection/handling, submission of specimen other than nasopharyngeal swab, presence of viral mutation(s) within the areas targeted by this assay, and inadequate number of viral copies(<138 copies/mL).  A negative result must be combined with clinical observations, patient history, and epidemiological information. The expected result is Negative.  Fact Sheet for Patients:  EntrepreneurPulse.com.au  Fact Sheet for Healthcare Providers:  IncredibleEmployment.be  This test is no t yet approved or cleared by the [place] FDA and  has been authorized for detection and/or  diagnosis of SARS-CoV-2 by FDA under an Emergency Use Authorization (EUA). This EUA will remain  in effect (meaning this test can be used) for the duration of the COVID-19 declaration under Section 564(b)(1) of the Act, 21 U.S.C.section 360bbb-3(b)(1), unless the authorization is terminated  or revoked sooner.       Influenza A by PCR NEGATIVE NEGATIVE Final   Influenza B by PCR NEGATIVE NEGATIVE Final    Comment: (NOTE) The Xpert Xpress SARS-CoV-2/FLU/RSV plus assay is intended as an aid in the diagnosis of influenza from Nasopharyngeal swab specimens and should not be used as a sole basis for treatment. Nasal washings and aspirates are unacceptable for Xpert Xpress SARS-CoV-2/FLU/RSV testing.  Fact Sheet for Patients: EntrepreneurPulse.com.au  Fact Sheet for Healthcare Providers: IncredibleEmployment.be  This test is not yet approved or cleared by the [place] FDA and has been authorized for detection and/or diagnosis of SARS-CoV-2 by FDA under an Emergency Use Authorization (EUA). This EUA will remain in effect (meaning this test can be used) for the duration of the COVID-19 declaration under Section 564(b)(1) of the Act, 21 U.S.C. section 360bbb-3(b)(1), unless the authorization is terminated or revoked.  Performed at [facility], [address] [place], [place] [address]   Culture, body fluid w Gram Stain-bottle     Status: None   Collection Time: 05/19/20  9:38 AM   Specimen: Pleura  Result Value Ref Range Status   Specimen Description PLEURAL FLUID  Final   Special Requests LEFT LUNG  Final   Culture   Final    NO GROWTH 5 DAYS Performed at [facility], [address], [place], [place] [address]    Report Status 05/24/2020 FINAL  Final  Gram stain     Status: None   Collection Time: 05/19/20  9:38 AM   Specimen: Pleura  Result Value Ref Range Status   Specimen Description PLEURAL FLUID  Final    Special Requests LEFT LUNG  Final   Gram Stain   Final    WBC PRESENT, PREDOMINANTLY MONONUCLEAR NO ORGANISMS SEEN CYTOSPIN SMEAR Performed at [facility], [place] [address], [place], [place] [address]    Report Status 05/19/2020 FINAL  Final  Culture, blood (Routine X 2) w Reflex to ID Panel     Status: None   Collection Time: 05/19/20  4:49 PM   Specimen: BLOOD RIGHT ARM  Result Value Ref Range Status   Specimen Description BLOOD RIGHT ARM  Final   Special Requests   Final    BOTTLES DRAWN AEROBIC ONLY Blood Culture adequate volume   Culture   Final    NO GROWTH 5 DAYS Performed at [facility], [place] [address] [place], [place] [address]    Report Status 05/24/2020 FINAL  Final  Culture, blood (Routine X 2) w Reflex to ID Panel     Status: None   Collection Time: 05/19/20  4:50 PM   Specimen: BLOOD  Result Value Ref Range Status   Specimen Description BLOOD SITE NOT SPECIFIED  Final   Special Requests   Final    BOTTLES DRAWN AEROBIC ONLY Blood Culture adequate volume  Culture   Final    NO GROWTH 5 DAYS Performed at [facility], [place] [address], [place], [place] [address]    Report Status 05/24/2020 FINAL  Final     Labs: BNP (last 3 results) No results for input(s): BNP in the last 8760 hours. Basic Metabolic Panel: Recent Labs  Lab 05/18/20 0405  NA 133*  K 4.0  CL 100  CO2 28  GLUCOSE 117*  BUN 20  CREATININE 0.77  CALCIUM 8.2*   Liver Function Tests: Recent Labs  Lab 05/18/20 1405  AST 14*  ALT 12  ALKPHOS 42  BILITOT 0.7  PROT 4.7*  ALBUMIN 2.6*   No results for input(s): LIPASE, AMYLASE in the last 168 hours. No results for input(s): AMMONIA in the last 168 hours. CBC: Recent Labs  Lab 05/18/20 0405 05/19/20 1108  WBC 5.8 5.9  HGB 8.9* 8.7*  HCT 25.8* 25.6*  MCV 95.6 96.2  PLT 150 168   Cardiac Enzymes: No results for input(s): CKTOTAL, CKMB, CKMBINDEX, TROPONINI in the last 168 hours. BNP: Invalid input(s):  POCBNP CBG: Recent Labs  Lab 05/23/20 0700 05/23/20 1137 05/23/20 1729 05/23/20 2059 05/24/20 0636  GLUCAP 187* 168* 106* 109* 92   D-Dimer No results for input(s): DDIMER in the last 72 hours. Hgb A1c No results for input(s): HGBA1C in the last 72 hours. Lipid Profile No results for input(s): CHOL, HDL, LDLCALC, TRIG, CHOLHDL, LDLDIRECT in the last 72 hours. Thyroid function studies No results for input(s): TSH, T4TOTAL, T3FREE, THYROIDAB in the last 72 hours.  Invalid input(s): FREET3 Anemia work up No results for input(s): VITAMINB12, FOLATE, FERRITIN, TIBC, IRON, RETICCTPCT in the last 72 hours. Urinalysis    Component Value Date/Time   COLORURINE YELLOW 05/19/2020 1957   APPEARANCEUR CLEAR 05/19/2020 1957   LABSPEC 1.015 05/19/2020 1957   PHURINE 5.0 05/19/2020 1957   GLUCOSEU 50 (A) 05/19/2020 1957   GLUCOSEU >=1000 (A) 03/16/2020 0958   HGBUR NEGATIVE 05/19/2020 1957   BILIRUBINUR NEGATIVE 05/19/2020 1957   KETONESUR 5 (A) 05/19/2020 1957   PROTEINUR NEGATIVE 05/19/2020 1957   UROBILINOGEN 0.2 03/16/2020 0958   NITRITE NEGATIVE 05/19/2020 1957   LEUKOCYTESUR NEGATIVE 05/19/2020 1957   Sepsis Labs Invalid input(s): PROCALCITONIN,  WBC,  LACTICIDVEN Microbiology Recent Results (from the past 240 hour(s))  Resp Panel by RT-PCR (Flu A&B, Covid) Nasopharyngeal Swab     Status: None   Collection Time: 05/16/20  1:06 PM   Specimen: Nasopharyngeal Swab; Nasopharyngeal(NP) swabs in vial transport medium  Result Value Ref Range Status   SARS Coronavirus 2 by RT PCR NEGATIVE NEGATIVE Final    Comment: (NOTE) SARS-CoV-2 target nucleic acids are NOT DETECTED.  The SARS-CoV-2 RNA is generally detectable in upper respiratory specimens during the acute phase of infection. The lowest concentration of SARS-CoV-2 viral copies this assay can detect is 138 copies/mL. A negative result does not preclude SARS-Cov-2 infection and should not be used as the sole basis for  treatment or other patient management decisions. A negative result may occur with  improper specimen collection/handling, submission of specimen other than nasopharyngeal swab, presence of viral mutation(s) within the areas targeted by this assay, and inadequate number of viral copies(<138 copies/mL). A negative result must be combined with clinical observations, patient history, and epidemiological information. The expected result is Negative.  Fact Sheet for Patients:  EntrepreneurPulse.com.au  Fact Sheet for Healthcare Providers:  IncredibleEmployment.be  This test is no t yet approved or cleared by the [place]  FDA and  has been authorized for detection and/or diagnosis of SARS-CoV-2 by FDA under an Emergency Use Authorization (EUA). This EUA will remain  in effect (meaning this test can be used) for the duration of the COVID-19 declaration under Section 564(b)(1) of the Act, 21 U.S.C.section 360bbb-3(b)(1), unless the authorization is terminated  or revoked sooner.       Influenza A by PCR NEGATIVE NEGATIVE Final   Influenza B by PCR NEGATIVE NEGATIVE Final    Comment: (NOTE) The Xpert Xpress SARS-CoV-2/FLU/RSV plus assay is intended as an aid in the diagnosis of influenza from Nasopharyngeal swab specimens and should not be used as a sole basis for treatment. Nasal washings and aspirates are unacceptable for Xpert Xpress SARS-CoV-2/FLU/RSV testing.  Fact Sheet for Patients: EntrepreneurPulse.com.au  Fact Sheet for Healthcare Providers: IncredibleEmployment.be  This test is not yet approved or cleared by the [place] FDA and has been authorized for detection and/or diagnosis of SARS-CoV-2 by FDA under an Emergency Use Authorization (EUA). This EUA will remain in effect (meaning this test can be used) for the duration of the COVID-19 declaration under Section 564(b)(1) of the Act, 21  U.S.C. section 360bbb-3(b)(1), unless the authorization is terminated or revoked.  Performed at [facility], [address], [place], [place] [address]   Culture, body fluid w Gram Stain-bottle     Status: None   Collection Time: 05/19/20  9:38 AM   Specimen: Pleura  Result Value Ref Range Status   Specimen Description PLEURAL FLUID  Final   Special Requests LEFT LUNG  Final   Culture   Final    NO GROWTH 5 DAYS Performed at [facility], [address] [place], [place] [address]    Report Status 05/24/2020 FINAL  Final  Gram stain     Status: None   Collection Time: 05/19/20  9:38 AM   Specimen: Pleura  Result Value Ref Range Status   Specimen Description PLEURAL FLUID  Final   Special Requests LEFT LUNG  Final   Gram Stain   Final    WBC PRESENT, PREDOMINANTLY MONONUCLEAR NO ORGANISMS SEEN CYTOSPIN SMEAR Performed at [facility], [place] [address] [place], [place] [address]    Report Status 05/19/2020 FINAL  Final  Culture, blood (Routine X 2) w Reflex to ID Panel     Status: None   Collection Time: 05/19/20  4:49 PM   Specimen: BLOOD RIGHT ARM  Result Value Ref Range Status   Specimen Description BLOOD RIGHT ARM  Final   Special Requests   Final    BOTTLES DRAWN AEROBIC ONLY Blood Culture adequate volume   Culture   Final    NO GROWTH 5 DAYS Performed at [facility], [place] [address], [place], [place] [address]    Report Status 05/24/2020 FINAL  Final  Culture, blood (Routine X 2) w Reflex to ID Panel     Status: None   Collection Time: 05/19/20  4:50 PM   Specimen: BLOOD  Result Value Ref Range Status   Specimen Description BLOOD SITE NOT SPECIFIED  Final   Special Requests   Final    BOTTLES DRAWN AEROBIC ONLY Blood Culture adequate volume   Culture   Final    NO GROWTH 5 DAYS Performed at [facility], [place] [address] [place], [place] [address]    Report Status 05/24/2020 FINAL  Final     Time coordinating  discharge: Over 30 minutes  SIGNED:  Charlynne Cousins, MD  [facility] 05/24/2020, 10:37 AM Pager   If 7PM-7AM, please contact night-coverage [email or web address] Password [email or web address]

## 2020-05-24 NOTE — Progress Notes (Signed)
[facility] PROGRESS NOTE    Progress Note  [name] [name]  DVV:[id number] DOB: 10/19/1955 DOA: 05/16/2020 PCP: Janith Lima, MD     Brief Narrative:   [name] [name] is an 65 y.o. male past medical history of atrial fibrillation on Eliquis, uncontrolled insulin-dependent diabetes mellitus type 2, CAD status post CABG times 01/2001 came into the ED after falling approximately 17 stairs after extensive radiologic evaluation he was diagnosed with a left scapular fracture and coracoid fracture.  He was initially admitted to the trauma service and Dr. Lorin Mercy recommended nonoperative management.  Work-up is underway for bilateral pleural effusion Eliquis was held due to decreasing hemoglobin and effusions.  Assessment/Plan:   Bilateral pleural effusion: His anticoagulation was held 2D echo was done that showed an EF of 55%. Due to his drop in his hemoglobin thoracocentesis was performed as there was concern of hemothorax, thoracocentesis yielded 1.3 L of yellow fluid. Has refused labs in the last days risks and benefits have been explained to him and we have explained that we do not know whether his hemoglobin is dropping or getting better he still refused labs.  He He relates he does not want labs anymore. His Eliquis was discontinued.  Possible aspiration pneumonia: He was started on IV Unasyn, will complete a total of 5 days he has defervesced. Physical therapy evaluated the patient and recommended skilled nursing facility.  Left scapular and coracoid fracture: Orthopedic surgery was consulted recommended conservative management with a sling. Physical therapy evaluated the patient and recommended skilled nursing facility.  CAD with a history of CABG in 2003: No anginal symptoms initially statins and ACE inhibitor were held these were resumed. He was continued on aspirin. He has a cardiology appointment scheduled for 05/26/2020. His Eliquis was held as he is refusing  lab draws and we do not know where his hemoglobin is at risk and benefits were explained to the patient.  Paroxysmal atrial fibrillation: Rate controlled follow-up with cardiology as an outpatient. He relates he is not going to have his labs drawn anymore he will have these as an outpatient the risk and benefits were explained. We will hold his Eliquis and restart Eliquis as an outpatient with cardiology once his hemoglobin has been checked.  Lower extremity swelling: Lower extremity Doppler was negative for DVT.  Normocytic anemia: There was a drop in his hemoglobin from 13-9 in the setting of Eliquis and trauma. His Eliquis was held, there were no bloody bowel movements or signs of overt bleeding in house. He is back is significantly bruised. He had a pleural effusion on chest x-ray thoracocentesis was done that yield no frank hemothorax. CT scan of the chest on admission did not show any fractures. He was started on Protonix.  Poorly controlled diabetes mellitus type 2: With an A1c of 12, he reports he stopped his Lantus to his at night due to his low blood glucose. In the hospital he was started on low-dose long-acting insulin plus sliding scale and his blood glucoses remain fairly controlled. RN Pressure Injury Documentation: Pressure Injury 05/23/20 Sacrum Stage 2 -  Partial thickness loss of dermis presenting as a shallow open injury with a red, pink wound bed without slough. (Active)  05/23/20 1000  Location: Sacrum  Location Orientation:   Staging: Stage 2 -  Partial thickness loss of dermis presenting as a shallow open injury with a red, pink wound bed without slough.  Wound Description (Comments):   Present on Admission:  DVT prophylaxis: lovenox Family Communication: Mother Status is: Inpatient  Remains inpatient appropriate because:Hemodynamically unstable   Dispo: The patient is from: Home              Anticipated d/c is to: Home              Patient  currently is not medically stable to d/c.   Difficult to place patient No  Code Status:     Code Status Orders  (From admission, onward)         Start     Ordered   05/16/20 1931  Full code  Continuous        05/16/20 1933        Code Status History    This patient has a current code status but no historical code status.   Advance Care Planning Activity        IV Access:    Peripheral IV   Procedures and diagnostic studies:   No results found.   Medical Consultants:    None.   Subjective:    [name] [name] relates no new complaints.  Objective:    Vitals:   05/23/20 1729 05/23/20 2058 05/24/20 0421 05/24/20 0921  BP: 136/78 111/73 121/73 118/66  Pulse: 64 61 (!) 59 61  Resp: 17 17 19 20   Temp: 98.8 F (37.1 C) 99.5 F (37.5 C) 99 F (37.2 C) 98.8 F (37.1 C)  TempSrc: Oral Oral Oral Oral  SpO2: 95% 95% 93% 96%  Weight:  79.4 kg    Height:       SpO2: 96 % O2 Flow Rate (L/min): 2 L/min   Intake/Output Summary (Last 24 hours) at 05/24/2020 1020 Last data filed at 05/24/2020 0900 Gross per 24 hour  Intake 1420 ml  Output 600 ml  Net 820 ml   Filed Weights   05/16/20 1003 05/19/20 2128 05/23/20 2058  Weight: 79.4 kg 79.4 kg 79.4 kg    Exam: General exam: In no acute distress. Respiratory system: Good air movement and clear to auscultation. Cardiovascular system: S1 & S2 heard, RRR. No JVD. Gastrointestinal system: Abdomen is nondistended, soft and nontender.  Extremities: No pedal edema. Skin: No rashes, lesions or ulcers   Data Reviewed:    Labs: Basic Metabolic Panel: Recent Labs  Lab 05/18/20 0405  NA 133*  K 4.0  CL 100  CO2 28  GLUCOSE 117*  BUN 20  CREATININE 0.77  CALCIUM 8.2*   GFR Estimated Creatinine Clearance: 100.6 mL/min (by C-G formula based on SCr of 0.77 mg/dL). Liver Function Tests: Recent Labs  Lab 05/18/20 1405  AST 14*  ALT 12  ALKPHOS 42  BILITOT 0.7  PROT 4.7*  ALBUMIN 2.6*    No results for input(s): LIPASE, AMYLASE in the last 168 hours. No results for input(s): AMMONIA in the last 168 hours. Coagulation profile No results for input(s): INR, PROTIME in the last 168 hours. COVID-19 Labs  No results for input(s): DDIMER, FERRITIN, LDH, CRP in the last 72 hours.  Lab Results  Component Value Date   SARSCOV2NAA NEGATIVE 05/16/2020   SARSCOV2NAA NEGATIVE 12/07/2019   [place] NEGATIVE 12/03/2019    CBC: Recent Labs  Lab 05/18/20 0405 05/19/20 1108  WBC 5.8 5.9  HGB 8.9* 8.7*  HCT 25.8* 25.6*  MCV 95.6 96.2  PLT 150 168   Cardiac Enzymes: No results for input(s): CKTOTAL, CKMB, CKMBINDEX, TROPONINI in the last 168 hours. BNP (last 3 results) No results for input(s): PROBNP in  the last 8760 hours. CBG: Recent Labs  Lab 05/23/20 0700 05/23/20 1137 05/23/20 1729 05/23/20 2059 05/24/20 0636  GLUCAP 187* 168* 106* 109* 92   D-Dimer: No results for input(s): DDIMER in the last 72 hours. Hgb A1c: No results for input(s): HGBA1C in the last 72 hours. Lipid Profile: No results for input(s): CHOL, HDL, LDLCALC, TRIG, CHOLHDL, LDLDIRECT in the last 72 hours. Thyroid function studies: No results for input(s): TSH, T4TOTAL, T3FREE, THYROIDAB in the last 72 hours.  Invalid input(s): FREET3 Anemia work up: No results for input(s): VITAMINB12, FOLATE, FERRITIN, TIBC, IRON, RETICCTPCT in the last 72 hours. Sepsis Labs: Recent Labs  Lab 05/18/20 0405 05/19/20 1108  WBC 5.8 5.9   Microbiology Recent Results (from the past 240 hour(s))  Resp Panel by RT-PCR (Flu A&B, Covid) Nasopharyngeal Swab     Status: None   Collection Time: 05/16/20  1:06 PM   Specimen: Nasopharyngeal Swab; Nasopharyngeal(NP) swabs in vial transport medium  Result Value Ref Range Status   SARS Coronavirus 2 by RT PCR NEGATIVE NEGATIVE Final    Comment: (NOTE) SARS-CoV-2 target nucleic acids are NOT DETECTED.  The SARS-CoV-2 RNA is generally detectable in upper  respiratory specimens during the acute phase of infection. The lowest concentration of SARS-CoV-2 viral copies this assay can detect is 138 copies/mL. A negative result does not preclude SARS-Cov-2 infection and should not be used as the sole basis for treatment or other patient management decisions. A negative result may occur with  improper specimen collection/handling, submission of specimen other than nasopharyngeal swab, presence of viral mutation(s) within the areas targeted by this assay, and inadequate number of viral copies(<138 copies/mL). A negative result must be combined with clinical observations, patient history, and epidemiological information. The expected result is Negative.  Fact Sheet for Patients:  EntrepreneurPulse.com.au  Fact Sheet for Healthcare Providers:  IncredibleEmployment.be  This test is no t yet approved or cleared by the [place] FDA and  has been authorized for detection and/or diagnosis of SARS-CoV-2 by FDA under an Emergency Use Authorization (EUA). This EUA will remain  in effect (meaning this test can be used) for the duration of the COVID-19 declaration under Section 564(b)(1) of the Act, 21 U.S.C.section 360bbb-3(b)(1), unless the authorization is terminated  or revoked sooner.       Influenza A by PCR NEGATIVE NEGATIVE Final   Influenza B by PCR NEGATIVE NEGATIVE Final    Comment: (NOTE) The Xpert Xpress SARS-CoV-2/FLU/RSV plus assay is intended as an aid in the diagnosis of influenza from Nasopharyngeal swab specimens and should not be used as a sole basis for treatment. Nasal washings and aspirates are unacceptable for Xpert Xpress SARS-CoV-2/FLU/RSV testing.  Fact Sheet for Patients: EntrepreneurPulse.com.au  Fact Sheet for Healthcare Providers: IncredibleEmployment.be  This test is not yet approved or cleared by the [place] FDA and has been  authorized for detection and/or diagnosis of SARS-CoV-2 by FDA under an Emergency Use Authorization (EUA). This EUA will remain in effect (meaning this test can be used) for the duration of the COVID-19 declaration under Section 564(b)(1) of the Act, 21 U.S.C. section 360bbb-3(b)(1), unless the authorization is terminated or revoked.  Performed at [facility], [address] [place], [place] [address]   Culture, body fluid w Gram Stain-bottle     Status: None   Collection Time: 05/19/20  9:38 AM   Specimen: Pleura  Result Value Ref Range Status   Specimen Description PLEURAL FLUID  Final   Special Requests LEFT  LUNG  Final   Culture   Final    NO GROWTH 5 DAYS Performed at [facility], [place] [address] [place], [place] [address]    Report Status 05/24/2020 FINAL  Final  Gram stain     Status: None   Collection Time: 05/19/20  9:38 AM   Specimen: Pleura  Result Value Ref Range Status   Specimen Description PLEURAL FLUID  Final   Special Requests LEFT LUNG  Final   Gram Stain   Final    WBC PRESENT, PREDOMINANTLY MONONUCLEAR NO ORGANISMS SEEN CYTOSPIN SMEAR Performed at [facility], [place] [address], [place], [place] [address]    Report Status 05/19/2020 FINAL  Final  Culture, blood (Routine X 2) w Reflex to ID Panel     Status: None   Collection Time: 05/19/20  4:49 PM   Specimen: BLOOD RIGHT ARM  Result Value Ref Range Status   Specimen Description BLOOD RIGHT ARM  Final   Special Requests   Final    BOTTLES DRAWN AEROBIC ONLY Blood Culture adequate volume   Culture   Final    NO GROWTH 5 DAYS Performed at [facility], [place] [address] [place], [place] [address]    Report Status 05/24/2020 FINAL  Final  Culture, blood (Routine X 2) w Reflex to ID Panel     Status: None   Collection Time: 05/19/20  4:50 PM   Specimen: BLOOD  Result Value Ref Range Status   Specimen Description BLOOD SITE NOT SPECIFIED  Final   Special Requests    Final    BOTTLES DRAWN AEROBIC ONLY Blood Culture adequate volume   Culture   Final    NO GROWTH 5 DAYS Performed at [facility], [place] [address] [place], [place] [address]    Report Status 05/24/2020 FINAL  Final     Medications:   . atorvastatin  40 mg Oral Daily  . insulin aspart  0-15 Units Subcutaneous TID WC  . insulin aspart  0-5 Units Subcutaneous QHS  . insulin aspart  5 Units Subcutaneous TID WC  . insulin glargine  20 Units Subcutaneous Daily  . mirtazapine  15 mg Oral QHS  . pantoprazole  40 mg Oral Daily  . PARoxetine  20 mg Oral Daily  . sodium chloride flush  3 mL Intravenous Q12H  . traZODone  25 mg Oral QHS   Continuous Infusions: . sodium chloride 250 mL (05/24/20 0416)  . ampicillin-sulbactam (UNASYN) IV 1.5 g (05/24/20 1006)      LOS: 8 days   Charlynne Cousins  [facility]  05/24/2020, 10:20 AM

## 2020-05-24 NOTE — Progress Notes (Signed)
Ok to add stop date of 5d to Unasyn per Dr. Aileen Fass.  Onnie Boer, PharmD, BCIDP, AAHIVP, CPP Infectious Disease Pharmacist 05/24/2020 9:57 AM

## 2020-05-25 ENCOUNTER — Telehealth: Payer: Self-pay

## 2020-05-25 ENCOUNTER — Ambulatory Visit: Payer: HMO | Admitting: Endocrinology

## 2020-05-25 ENCOUNTER — Ambulatory Visit: Payer: HMO | Admitting: Cardiovascular Disease

## 2020-05-25 ENCOUNTER — Encounter: Payer: Self-pay | Admitting: Cardiovascular Disease

## 2020-05-25 NOTE — Progress Notes (Deleted)
Cardiology Office Note:   Date:  05/25/2020  NAME:  [name] [name]    MRN: [id number] DOB:  1955/09/27   PCP:  [name] Lima, MD  Cardiologist:  None  Electrophysiologist:  None   Referring MD: [name] Lima, MD   No chief complaint on file. ***  History of Present Illness:   [name] [name] is a 65 y.o. male with a hx of CAD s/p CABG, DM, HLD, persistent Afib who is being seen today for the evaluation of persistent Afib at the request of [name] Lima, MD. Recent admission 5/16-5/20 for fall suffering L scapular fracture and coracoid fracture. During that stay he had an aspiration PNA and thoracentesis for a pleural effusion. HGB dropped after the procedure and he refused further testing. His eliquis was stopped.   Problem List 1. CAD s/p CABG -CABG 09/30/2001 -[name]-LAD, RIMA-RCA, Radial graft-LCX, SVG-D1 2. DM -A1c 14.3 3. HLD -T chol 123, HDL 39, LDL 37, TG 222 4. HTN 5. Persistent Atrial fibrillation  Past Medical History: Past Medical History:  Diagnosis Date  . Anxiety   . Aortic dissection ([facility])   . Arthritis   . Constipation    pt states goes EOD- hard stools   . Coronary artery disease   . Depression   . Diabetes mellitus without complication ([facility])   . DKA (diabetic ketoacidoses)   . Hyperlipidemia   . Hypertension    off meds  . Psoriasis     Past Surgical History: Past Surgical History:  Procedure Laterality Date  . ANKLE SURGERY     right  . CARDIAC CATHETERIZATION  09/25/2001  . COLONOSCOPY     ~10 yr ago- normal  per pt   . CORONARY ARTERY BYPASS GRAFT  09/30/2001   CABG x 4  . IR THORACENTESIS ASP PLEURAL SPACE W/IMG GUIDE  05/19/2020  . LAPAROSCOPIC INGUINAL HERNIA REPAIR Bilateral 02/09/2010  . MASS EXCISION Left 03/12/2013   Procedure: LEFT INDEX EXCISION MASS AND DEBRIDMENT DISTAL INTERPHALANGEAL JOINT;  Surgeon: Tennis Must, MD;  Location: [facility];  Service: Orthopedics;  Laterality: Left;    Current  Medications: No outpatient medications have been marked as taking for the 05/26/20 encounter (Appointment) with O'Neal, [name] Freer, MD.     Allergies:    Patient has no known allergies.   Social History: Social History   Socioeconomic History  . Marital status: Divorced    Spouse name: Not on file  . Number of children: 4  . Years of education: 15  . Highest education level: 12th grade  Occupational History  . Occupation: Retired  Tobacco Use  . Smoking status: Current Some Day Smoker    Types: Cigars  . Smokeless tobacco: Never Used  Vaping Use  . Vaping Use: Never used  Substance and Sexual Activity  . Alcohol use: Yes    Comment: daily beer or scotch   . Drug use: No  . Sexual activity: Yes  Other Topics Concern  . Not on file  Social History Narrative   Lives with his adult son [name] [name].   Has 4 children, 2 sons- both live in [place], 2 daughters one lives in [place], [place] and youngest daughter attends [employer].    Fiance lives in  [place], [place]   Social Determinants of Health   Financial Resource Strain: Unknown  . Difficulty of Paying Living Expenses: Patient refused  Food Insecurity: No Food Insecurity  . Worried About Charity fundraiser in  the Last Year: Never true  . Ran Out of Food in the Last Year: Never true  Transportation Needs: No Transportation Needs  . Lack of Transportation (Medical): No  . Lack of Transportation (Non-Medical): No  Physical Activity: Inactive  . Days of Exercise per Week: 0 days  . Minutes of Exercise per Session: 0 min  Stress: Stress Concern Present  . Feeling of Stress : Very much  Social Connections: Socially Isolated  . Frequency of Communication with Friends and Family: More than three times a week  . Frequency of Social Gatherings with Friends and Family: More than three times a week  . Attends Religious Services: Never  . Active Member of Clubs or Organizations: No  . Attends Theatre manager Meetings: Never  . Marital Status: Divorced     Family History: The patient's ***family history includes Cancer in his paternal grandmother; Colon cancer in his paternal grandmother; Lymphoma in his father. There is no history of Colon polyps, Esophageal cancer, Rectal cancer, or Stomach cancer.  ROS:   All other ROS reviewed and negative. Pertinent positives noted in the HPI.     EKGs/Labs/Other Studies Reviewed:   The following studies were personally reviewed by me today:  EKG:  EKG is *** ordered today.  The ekg ordered today demonstrates ***, and was personally reviewed by me.   TTE 05/17/2020 1. Left ventricular ejection fraction, by estimation, is 55 to 60%. The  left ventricle has normal function. The left ventricle has no regional  wall motion abnormalities. Left ventricular diastolic parameters are  indeterminate.  2. Right ventricular systolic function is low normal. The right  ventricular size is normal. There is mildly elevated pulmonary artery  systolic pressure.  3. Moderate pleural effusion in the left lateral region.  4. The mitral valve is grossly normal. No evidence of mitral valve  regurgitation.  5. The aortic valve is tricuspid. There is moderate thickening of the  aortic valve. Aortic valve regurgitation is not visualized.  6. There is borderline dilatation of the aortic root, measuring 38 mm.  There is borderline dilatation of the ascending aorta, measuring 39 mm.  These are within normal limits for age and BSA.  7. Agitated saline contrast bubble study was negative, with no evidence  of any interatrial shunt.   Recent Labs: 05/16/2020: TSH 2.845 05/18/2020: ALT 12; BUN 20; Creatinine, Ser 0.77; Potassium 4.0; Sodium 133 05/19/2020: Hemoglobin 8.7; Platelets 168   Recent Lipid Panel    Component Value Date/Time   CHOL 123 03/16/2020 0958   TRIG 222.0 (H) 03/16/2020 0958   HDL 38.60 (L) 03/16/2020 0958   CHOLHDL 3 03/16/2020 0958    VLDL 44.4 (H) 03/16/2020 0958   LDLCALC 53 01/06/2019 1446   LDLDIRECT 37.0 03/16/2020 0958    Physical Exam:   VS:  There were no vitals taken for this visit.   Wt Readings from Last 3 Encounters:  05/23/20 175 lb 0.7 oz (79.4 kg)  03/16/20 155 lb (70.3 kg)  01/13/20 162 lb (73.5 kg)    General: Well nourished, well developed, in no acute distress Head: Atraumatic, normal size  Eyes: PEERLA, EOMI  Neck: Supple, no JVD Endocrine: No thryomegaly Cardiac: Normal S1, S2; RRR; no murmurs, rubs, or gallops Lungs: Clear to auscultation bilaterally, no wheezing, rhonchi or rales  Abd: Soft, nontender, no hepatomegaly  Ext: No edema, pulses 2+ Musculoskeletal: No deformities, BUE and BLE strength normal and equal Skin: Warm and dry, no rashes   Neuro:  Alert and oriented to person, place, time, and situation, CNII-XII grossly intact, no focal deficits  Psych: Normal mood and affect   ASSESSMENT:   [name] [name] is a 64 y.o. male who presents for the following: No diagnosis found.  PLAN:   There are no diagnoses linked to this encounter.  Disposition: No follow-ups on file.  Medication Adjustments/Labs and Tests Ordered: Current medicines are reviewed at length with the patient today.  Concerns regarding medicines are outlined above.  No orders of the defined types were placed in this encounter.  No orders of the defined types were placed in this encounter.   There are no Patient Instructions on file for this visit.   Time Spent with Patient: I have spent a total of *** minutes with patient reviewing hospital notes, telemetry, EKGs, labs and examining the patient as well as establishing an assessment and plan that was discussed with the patient.  > 50% of time was spent in direct patient care.  Signed, Addison Naegeli. Audie Box, MD, [facility]  [address] [place], [place] [address] [phone]  05/25/2020 7:50 PM

## 2020-05-25 NOTE — Telephone Encounter (Signed)
Transition Care Management Unsuccessful Follow-up Telephone Call  Date of discharge and from where:  05/24/2020 from [facility]  Attempts:  1st Attempt  Reason for unsuccessful TCM follow-up call:  Unable to leave message  Nurse Note: Attempted to contact patient to discuss transition of care from inpatient admission.  Patient did not answer the phone.  Unable to leave voicemail as it was not set up.  Will attempt to call again or follow up.

## 2020-05-26 ENCOUNTER — Ambulatory Visit: Payer: HMO | Admitting: Cardiovascular Disease

## 2020-05-26 DIAGNOSIS — S42109A Fracture of unspecified part of scapula, unspecified shoulder, initial encounter for closed fracture: Secondary | ICD-10-CM | POA: Diagnosis not present

## 2020-05-26 DIAGNOSIS — R262 Difficulty in walking, not elsewhere classified: Secondary | ICD-10-CM | POA: Diagnosis not present

## 2020-05-26 DIAGNOSIS — F329 Major depressive disorder, single episode, unspecified: Secondary | ICD-10-CM | POA: Diagnosis not present

## 2020-05-26 DIAGNOSIS — F338 Other recurrent depressive disorders: Secondary | ICD-10-CM | POA: Diagnosis not present

## 2020-05-26 DIAGNOSIS — F17200 Nicotine dependence, unspecified, uncomplicated: Secondary | ICD-10-CM | POA: Diagnosis not present

## 2020-05-26 DIAGNOSIS — R2681 Unsteadiness on feet: Secondary | ICD-10-CM | POA: Diagnosis not present

## 2020-05-26 DIAGNOSIS — Z9181 History of falling: Secondary | ICD-10-CM | POA: Diagnosis not present

## 2020-05-26 DIAGNOSIS — M6281 Muscle weakness (generalized): Secondary | ICD-10-CM | POA: Diagnosis not present

## 2020-05-26 DIAGNOSIS — J690 Pneumonitis due to inhalation of food and vomit: Secondary | ICD-10-CM | POA: Diagnosis not present

## 2020-05-26 DIAGNOSIS — I2510 Atherosclerotic heart disease of native coronary artery without angina pectoris: Secondary | ICD-10-CM | POA: Diagnosis not present

## 2020-05-26 DIAGNOSIS — M1990 Unspecified osteoarthritis, unspecified site: Secondary | ICD-10-CM | POA: Diagnosis not present

## 2020-05-26 DIAGNOSIS — N401 Enlarged prostate with lower urinary tract symptoms: Secondary | ICD-10-CM | POA: Diagnosis not present

## 2020-05-26 DIAGNOSIS — Z794 Long term (current) use of insulin: Secondary | ICD-10-CM | POA: Diagnosis not present

## 2020-05-26 DIAGNOSIS — E871 Hypo-osmolality and hyponatremia: Secondary | ICD-10-CM | POA: Diagnosis not present

## 2020-05-26 DIAGNOSIS — I4891 Unspecified atrial fibrillation: Secondary | ICD-10-CM | POA: Diagnosis not present

## 2020-05-26 DIAGNOSIS — I480 Paroxysmal atrial fibrillation: Secondary | ICD-10-CM | POA: Diagnosis not present

## 2020-05-26 DIAGNOSIS — D6489 Other specified anemias: Secondary | ICD-10-CM | POA: Diagnosis not present

## 2020-05-26 DIAGNOSIS — F411 Generalized anxiety disorder: Secondary | ICD-10-CM | POA: Diagnosis not present

## 2020-05-26 DIAGNOSIS — D649 Anemia, unspecified: Secondary | ICD-10-CM | POA: Diagnosis not present

## 2020-05-26 DIAGNOSIS — E1042 Type 1 diabetes mellitus with diabetic polyneuropathy: Secondary | ICD-10-CM | POA: Diagnosis not present

## 2020-05-26 DIAGNOSIS — S42102D Fracture of unspecified part of scapula, left shoulder, subsequent encounter for fracture with routine healing: Secondary | ICD-10-CM | POA: Diagnosis not present

## 2020-05-26 DIAGNOSIS — K5909 Other constipation: Secondary | ICD-10-CM | POA: Diagnosis not present

## 2020-05-26 DIAGNOSIS — E118 Type 2 diabetes mellitus with unspecified complications: Secondary | ICD-10-CM | POA: Diagnosis not present

## 2020-05-26 DIAGNOSIS — M76891 Other specified enthesopathies of right lower limb, excluding foot: Secondary | ICD-10-CM | POA: Diagnosis not present

## 2020-05-27 ENCOUNTER — Telehealth: Payer: Self-pay | Admitting: Cardiovascular Disease

## 2020-05-27 NOTE — Telephone Encounter (Signed)
Returned call to [facility] she states that she was unable to provide transportation to his recent appt. I have scheduled an appt 06-01-20.

## 2020-05-27 NOTE — Telephone Encounter (Signed)
New message:     Irene Shipper calling from [place] this patient he had a pt with Dr. Audie Box yesterday the rehab center could not get this patient to the apt. However the patient has a urgent referral and also need a refill as well. Please advise . Please call back [phone] ext [phone].

## 2020-05-30 DIAGNOSIS — Z9181 History of falling: Secondary | ICD-10-CM | POA: Diagnosis not present

## 2020-05-30 DIAGNOSIS — E1042 Type 1 diabetes mellitus with diabetic polyneuropathy: Secondary | ICD-10-CM | POA: Diagnosis not present

## 2020-05-30 DIAGNOSIS — M1990 Unspecified osteoarthritis, unspecified site: Secondary | ICD-10-CM | POA: Diagnosis not present

## 2020-05-30 DIAGNOSIS — F329 Major depressive disorder, single episode, unspecified: Secondary | ICD-10-CM | POA: Diagnosis not present

## 2020-05-30 DIAGNOSIS — N401 Enlarged prostate with lower urinary tract symptoms: Secondary | ICD-10-CM | POA: Diagnosis not present

## 2020-05-30 DIAGNOSIS — I4891 Unspecified atrial fibrillation: Secondary | ICD-10-CM | POA: Diagnosis not present

## 2020-05-30 DIAGNOSIS — R2681 Unsteadiness on feet: Secondary | ICD-10-CM | POA: Diagnosis not present

## 2020-05-30 DIAGNOSIS — F411 Generalized anxiety disorder: Secondary | ICD-10-CM | POA: Diagnosis not present

## 2020-05-30 DIAGNOSIS — S42102D Fracture of unspecified part of scapula, left shoulder, subsequent encounter for fracture with routine healing: Secondary | ICD-10-CM | POA: Diagnosis not present

## 2020-05-30 DIAGNOSIS — I2510 Atherosclerotic heart disease of native coronary artery without angina pectoris: Secondary | ICD-10-CM | POA: Diagnosis not present

## 2020-05-30 DIAGNOSIS — D649 Anemia, unspecified: Secondary | ICD-10-CM | POA: Diagnosis not present

## 2020-05-30 DIAGNOSIS — M6281 Muscle weakness (generalized): Secondary | ICD-10-CM | POA: Diagnosis not present

## 2020-05-30 NOTE — Progress Notes (Deleted)
Cardiology Clinic Note   Patient Name: [name] [name] Date of Encounter: 05/31/2020  Primary Care Provider:  Janith Lima, MD Primary Cardiologist:  Evalina Field, MD  Patient Profile    [name] [name] 65 year old male who presents to the clinic today for follow-up evaluation of his hypertension and paroxysmal atrial fibrillation  Past Medical History    Past Medical History:  Diagnosis Date  . Anxiety   . Arthritis   . Constipation    pt states goes EOD- hard stools   . Coronary artery disease   . Depression   . Diabetes mellitus without complication ([place])   . DKA (diabetic ketoacidoses)   . Hyperlipidemia   . Hypertension    off meds  . Psoriasis    Past Surgical History:  Procedure Laterality Date  . ANKLE SURGERY     right  . CARDIAC CATHETERIZATION  09/25/2001  . COLONOSCOPY     ~10 yr ago- normal  per pt   . CORONARY ARTERY BYPASS GRAFT  09/30/2001   CABG x 4  . IR THORACENTESIS ASP PLEURAL SPACE W/IMG GUIDE  05/19/2020  . LAPAROSCOPIC INGUINAL HERNIA REPAIR Bilateral 02/09/2010  . MASS EXCISION Left 03/12/2013   Procedure: LEFT INDEX EXCISION MASS AND DEBRIDMENT DISTAL INTERPHALANGEAL JOINT;  Surgeon: Tennis Must, MD;  Location: [facility];  Service: Orthopedics;  Laterality: Left;    Allergies  No Known Allergies  History of Present Illness    [name] [name] is a PMH of coronary artery disease status post CABG x1 in 2003, hypertension, anemia, paroxysmal atrial fibrillation on apixaban, type 2 diabetes, left scapular fracture, and hypoxemia.  He was recently admitted to the hospital on 05/16/2020 and discharged on 05/24/2020.  He presented to the emergency department after falling down approximately 17 stairs.  His x-rays diagnosed left scapular fracture and coracoid fracture.  Nonoperative management was recommended.  His Eliquis was held due to decreasing hemoglobin and effusions.  Echocardiogram showed an EF of 55%.  He  underwent thoracentesis due to concern for hemothorax.  1.3 L of yellow fluid were removed with no signs of bleeding.  Of note during his hospitalization he refused medications such as Eliquis.  He reported he was able to make his own decisions.  He also refused blood draws.  He reported he would have his blood drawn at his PCP office.  He received IV antibiotics for possible aspiration pneumonia.  He was noted to have lower extremity swelling and his lower extremity Dopplers were negative for DVT.  He presents to the clinic today for follow-up evaluation states***  Today he denies chest pain, shortness of breath, lower extremity edema, fatigue, palpitations, melena, hematuria, hemoptysis, diaphoresis, weakness, presyncope, syncope, orthopnea, and PND.   Home Medications    Prior to Admission medications   Medication Sig Start Date End Date Taking? Authorizing Provider  aspirin EC 325 MG tablet Take 325 mg by mouth daily.    [provider]  atorvastatin (LIPITOR) 40 MG tablet Take 1 tablet (40 mg total) by mouth daily. 01/13/20   Janith Lima, MD  B Complex Vitamins (B COMPLEX-B12 PO) Take 1 tablet by mouth daily.     [provider]  Cholecalciferol (VITAMIN D) 125 MCG (5000 UT) CAPS Take 5,000 Units by mouth daily.     [provider]  Continuous Blood Gluc Receiver (FREESTYLE LIBRE 14 DAY READER) DEVI 1 each by Does not apply route daily. 01/27/20   Ronnald Ramp,  Arvid Right, MD  Continuous Blood Gluc Sensor (FREESTYLE LIBRE 14 DAY SENSOR) MISC 1 each by Does not apply route daily. 01/27/20   Janith Lima, MD  Insulin Aspart FlexPen 100 UNIT/ML SOPN Inject 10-20 Units into the skin See admin instructions. Injects10-20 units into the skin three times daily per sliding scale. Patient taking differently: Inject 0-10 Units into the skin See admin instructions. Injects 0-10 units into the skin three times daily per sliding scale. 04/12/20   Janith Lima, MD  insulin glargine  (LANTUS) 100 UNIT/ML injection Inject 0.5 mLs (50 Units total) into the skin at bedtime. Patient not taking: Reported on 05/17/2020 01/27/20   Janith Lima, MD  Insulin Pen Needle (DROPLET PEN NEEDLES) 32G X 4 MM MISC Use daily for glucose control 01/27/20   Janith Lima, MD  lisinopril (ZESTRIL) 5 MG tablet TAKE 1 TABLET BY MOUTH DAILY Patient taking differently: Take 5 mg by mouth daily. 08/04/19   Isaac Bliss, Rayford Halsted, MD  mirtazapine (REMERON) 15 MG tablet Take 1 tablet (15 mg total) by mouth at bedtime. 01/13/20   Janith Lima, MD  PARoxetine (PAXIL) 20 MG tablet Take 1 tablet (20 mg total) by mouth daily. 01/13/20   Janith Lima, MD  traZODone (DESYREL) 50 MG tablet Take 1 tablet (50 mg total) by mouth at bedtime. 01/13/20   Janith Lima, MD    Family History    Family History  Problem Relation Age of Onset  . Lymphoma Father   . Cancer Paternal Grandmother        Colon Cancer  . Colon cancer Paternal Grandmother   . Colon polyps Neg Hx   . Esophageal cancer Neg Hx   . Rectal cancer Neg Hx   . Stomach cancer Neg Hx    He indicated that his mother is alive. He indicated that his father is alive. He indicated that the status of his paternal grandmother is unknown. He indicated that the status of his neg hx is unknown.  Social History    Social History   Socioeconomic History  . Marital status: Divorced    Spouse name: Not on file  . Number of children: 4  . Years of education: 60  . Highest education level: 12th grade  Occupational History  . Occupation: Retired  Tobacco Use  . Smoking status: Current Some Day Smoker    Types: Cigars  . Smokeless tobacco: Never Used  Vaping Use  . Vaping Use: Never used  Substance and Sexual Activity  . Alcohol use: Yes    Comment: daily beer or scotch   . Drug use: No  . Sexual activity: Yes  Other Topics Concern  . Not on file  Social History Narrative   Lives with his adult son [name].   Has 4 children, 2 sons-  both live in [place], 2 daughters one lives in [place], [place] and youngest daughter attends [employer].    Fiance lives in  [place], [place]   Social Determinants of Health   Financial Resource Strain: Unknown  . Difficulty of Paying Living Expenses: Patient refused  Food Insecurity: No Food Insecurity  . Worried About Charity fundraiser in the Last Year: Never true  . Ran Out of Food in the Last Year: Never true  Transportation Needs: No Transportation Needs  . Lack of Transportation (Medical): No  . Lack of Transportation (Non-Medical): No  Physical Activity: Inactive  . Days of Exercise per Week: 0  days  . Minutes of Exercise per Session: 0 min  Stress: Stress Concern Present  . Feeling of Stress : Very much  Social Connections: Socially Isolated  . Frequency of Communication with Friends and Family: More than three times a week  . Frequency of Social Gatherings with Friends and Family: More than three times a week  . Attends Religious Services: Never  . Active Member of Clubs or Organizations: No  . Attends Archivist Meetings: Never  . Marital Status: Divorced  Human resources officer Violence: Not At Risk  . Fear of Current or Ex-Partner: No  . Emotionally Abused: No  . Physically Abused: No  . Sexually Abused: No     Review of Systems    General:  No chills, fever, night sweats or weight changes.  Cardiovascular:  No chest pain, dyspnea on exertion, edema, orthopnea, palpitations, paroxysmal nocturnal dyspnea. Dermatological: No rash, lesions/masses Respiratory: No cough, dyspnea Urologic: No hematuria, dysuria Abdominal:   No nausea, vomiting, diarrhea, bright red blood per rectum, melena, or hematemesis Neurologic:  No visual changes, wkns, changes in mental status. All other systems reviewed and are otherwise negative except as noted above.  Physical Exam    VS:  There were no vitals taken for this visit. , BMI There is no height or weight  on file to calculate BMI. GEN: Well nourished, well developed, in no acute distress. HEENT: normal. Neck: Supple, no JVD, carotid bruits, or masses. Cardiac: RRR, no murmurs, rubs, or gallops. No clubbing, cyanosis, edema.  Radials/DP/PT 2+ and equal bilaterally.  Respiratory:  Respirations regular and unlabored, clear to auscultation bilaterally. GI: Soft, nontender, nondistended, BS + x 4. MS: no deformity or atrophy. Skin: warm and dry, no rash. Neuro:  Strength and sensation are intact. Psych: Normal affect.  Accessory Clinical Findings    Recent Labs: 05/16/2020: TSH 2.845 05/18/2020: ALT 12; BUN 20; Creatinine, Ser 0.77; Potassium 4.0; Sodium 133 05/19/2020: Hemoglobin 8.7; Platelets 168   Recent Lipid Panel    Component Value Date/Time   CHOL 123 03/16/2020 0958   TRIG 222.0 (H) 03/16/2020 0958   HDL 38.60 (L) 03/16/2020 0958   CHOLHDL 3 03/16/2020 0958   VLDL 44.4 (H) 03/16/2020 0958   LDLCALC 53 01/06/2019 1446   LDLDIRECT 37.0 03/16/2020 0958    ECG personally reviewed by me today- *** - No acute changes  Echocardiogram 05/17/2020 IMPRESSIONS    1. Left ventricular ejection fraction, by estimation, is 55 to 60%. The  left ventricle has normal function. The left ventricle has no regional  wall motion abnormalities. Left ventricular diastolic parameters are  indeterminate.  2. Right ventricular systolic function is low normal. The right  ventricular size is normal. There is mildly elevated pulmonary artery  systolic pressure.  3. Moderate pleural effusion in the left lateral region.  4. The mitral valve is grossly normal. No evidence of mitral valve  regurgitation.  5. The aortic valve is tricuspid. There is moderate thickening of the  aortic valve. Aortic valve regurgitation is not visualized.  6. There is borderline dilatation of the aortic root, measuring 38 mm.  There is borderline dilatation of the ascending aorta, measuring 39 mm.  These are within  normal limits for age and BSA.  7. Agitated saline contrast bubble study was negative, with no evidence  of any interatrial shunt.   Comparison(s): No prior Echocardiogram.  Assessment & Plan   1.  Coronary artery disease-no chest pain today.  No recent episodes of chest arm  or neck discomfort.  Underwent CABG x1 in 2003. Continue aspirin, Heart healthy low-sodium diet-salty 6 given Increase physical activity as tolerated  Essential hypertension-BP today***.  Well-controlled at home. Continue lisinopril Heart healthy low-sodium diet-salty 6 given Increase physical activity as tolerated  Paroxysmal atrial fibrillation- heart rate today***.  No recent episodes of accelerated regular heartbeat.  Refused apixaban in the hospital after traumatic fall.  Not felt to be a good candidate for anticoagulation. Continue aspirin Heart healthy low-sodium diet-salty 6 given Increase physical activity as tolerated  Hyperlipidemia-on statin therapy Continue atorvastatin Follows with PCP  Disposition: Follow-up with Dr. Audie Box in 3-4 months.   Jossie Ng. Pamla Pangle NP-C    05/31/2020, 8:00 AM [facility] [address] Office [phone] Fax [phone]  Notice: This dictation was prepared with Dragon dictation along with smaller phrase technology. Any transcriptional errors that result from this process are unintentional and may not be corrected upon review.  I spent***minutes examining this patient, reviewing medications, and using patient centered shared decision making involving her cardiac care.  Prior to her visit I spent greater than 20 minutes reviewing her past medical history,  medications, and prior cardiac tests.

## 2020-05-31 DIAGNOSIS — S42109D Fracture of unspecified part of scapula, unspecified shoulder, subsequent encounter for fracture with routine healing: Secondary | ICD-10-CM | POA: Diagnosis not present

## 2020-05-31 DIAGNOSIS — D6489 Other specified anemias: Secondary | ICD-10-CM | POA: Diagnosis not present

## 2020-05-31 DIAGNOSIS — E871 Hypo-osmolality and hyponatremia: Secondary | ICD-10-CM | POA: Diagnosis not present

## 2020-05-31 DIAGNOSIS — E118 Type 2 diabetes mellitus with unspecified complications: Secondary | ICD-10-CM | POA: Diagnosis not present

## 2020-05-31 DIAGNOSIS — I480 Paroxysmal atrial fibrillation: Secondary | ICD-10-CM | POA: Diagnosis not present

## 2020-05-31 DIAGNOSIS — I10 Essential (primary) hypertension: Secondary | ICD-10-CM | POA: Diagnosis not present

## 2020-06-01 ENCOUNTER — Ambulatory Visit: Payer: HMO | Admitting: General Practice

## 2020-06-02 DIAGNOSIS — N401 Enlarged prostate with lower urinary tract symptoms: Secondary | ICD-10-CM | POA: Diagnosis not present

## 2020-06-02 DIAGNOSIS — R2681 Unsteadiness on feet: Secondary | ICD-10-CM | POA: Diagnosis not present

## 2020-06-02 DIAGNOSIS — M1990 Unspecified osteoarthritis, unspecified site: Secondary | ICD-10-CM | POA: Diagnosis not present

## 2020-06-02 DIAGNOSIS — D649 Anemia, unspecified: Secondary | ICD-10-CM | POA: Diagnosis not present

## 2020-06-02 DIAGNOSIS — F411 Generalized anxiety disorder: Secondary | ICD-10-CM | POA: Diagnosis not present

## 2020-06-02 DIAGNOSIS — I4891 Unspecified atrial fibrillation: Secondary | ICD-10-CM | POA: Diagnosis not present

## 2020-06-02 DIAGNOSIS — Z9181 History of falling: Secondary | ICD-10-CM | POA: Diagnosis not present

## 2020-06-02 DIAGNOSIS — M6281 Muscle weakness (generalized): Secondary | ICD-10-CM | POA: Diagnosis not present

## 2020-06-02 DIAGNOSIS — F329 Major depressive disorder, single episode, unspecified: Secondary | ICD-10-CM | POA: Diagnosis not present

## 2020-06-02 DIAGNOSIS — S42102D Fracture of unspecified part of scapula, left shoulder, subsequent encounter for fracture with routine healing: Secondary | ICD-10-CM | POA: Diagnosis not present

## 2020-06-02 DIAGNOSIS — E1042 Type 1 diabetes mellitus with diabetic polyneuropathy: Secondary | ICD-10-CM | POA: Diagnosis not present

## 2020-06-02 DIAGNOSIS — I2510 Atherosclerotic heart disease of native coronary artery without angina pectoris: Secondary | ICD-10-CM | POA: Diagnosis not present

## 2020-06-06 DIAGNOSIS — M1990 Unspecified osteoarthritis, unspecified site: Secondary | ICD-10-CM | POA: Diagnosis not present

## 2020-06-06 DIAGNOSIS — S42102D Fracture of unspecified part of scapula, left shoulder, subsequent encounter for fracture with routine healing: Secondary | ICD-10-CM | POA: Diagnosis not present

## 2020-06-06 DIAGNOSIS — R2681 Unsteadiness on feet: Secondary | ICD-10-CM | POA: Diagnosis not present

## 2020-06-06 DIAGNOSIS — I4891 Unspecified atrial fibrillation: Secondary | ICD-10-CM | POA: Diagnosis not present

## 2020-06-06 DIAGNOSIS — F411 Generalized anxiety disorder: Secondary | ICD-10-CM | POA: Diagnosis not present

## 2020-06-06 DIAGNOSIS — I2510 Atherosclerotic heart disease of native coronary artery without angina pectoris: Secondary | ICD-10-CM | POA: Diagnosis not present

## 2020-06-06 DIAGNOSIS — Z9181 History of falling: Secondary | ICD-10-CM | POA: Diagnosis not present

## 2020-06-06 DIAGNOSIS — D649 Anemia, unspecified: Secondary | ICD-10-CM | POA: Diagnosis not present

## 2020-06-06 DIAGNOSIS — M6281 Muscle weakness (generalized): Secondary | ICD-10-CM | POA: Diagnosis not present

## 2020-06-06 DIAGNOSIS — E1042 Type 1 diabetes mellitus with diabetic polyneuropathy: Secondary | ICD-10-CM | POA: Diagnosis not present

## 2020-06-06 DIAGNOSIS — N401 Enlarged prostate with lower urinary tract symptoms: Secondary | ICD-10-CM | POA: Diagnosis not present

## 2020-06-06 DIAGNOSIS — F329 Major depressive disorder, single episode, unspecified: Secondary | ICD-10-CM | POA: Diagnosis not present

## 2020-06-07 ENCOUNTER — Ambulatory Visit: Payer: HMO | Admitting: General Practice

## 2020-06-07 DIAGNOSIS — L89150 Pressure ulcer of sacral region, unstageable: Secondary | ICD-10-CM | POA: Diagnosis not present

## 2020-06-08 ENCOUNTER — Ambulatory Visit (INDEPENDENT_AMBULATORY_CARE_PROVIDER_SITE_OTHER): Payer: HMO | Admitting: Orthopaedic Surgery

## 2020-06-08 ENCOUNTER — Encounter: Payer: Self-pay | Admitting: Orthopaedic Surgery

## 2020-06-08 ENCOUNTER — Ambulatory Visit: Payer: Self-pay

## 2020-06-08 VITALS — BP 128/67 | HR 70 | Ht 74.0 in | Wt 175.0 lb

## 2020-06-08 DIAGNOSIS — M898X1 Other specified disorders of bone, shoulder: Secondary | ICD-10-CM

## 2020-06-08 DIAGNOSIS — S42112D Displaced fracture of body of scapula, left shoulder, subsequent encounter for fracture with routine healing: Secondary | ICD-10-CM

## 2020-06-08 DIAGNOSIS — M25522 Pain in left elbow: Secondary | ICD-10-CM

## 2020-06-08 NOTE — Progress Notes (Signed)
Office Visit Note   Patient: [name] [name]           Date of Birth: 01/28/1955           MRN: [id number] Visit Date: 06/08/2020              Requested by: Janith Lima, MD [address] [place],  [place] [address] PCP: Janith Lima, MD   Assessment & Plan: Visit Diagnoses:  1. Pain of left scapula   2. Pain in left elbow     Plan: Continue sling he can remove it to work gentle range of motion exercises and stop at the age of pain.  We discussed she should have progressive healing and will recheck him in 1 month at that point we may consider formal physical therapy.  Follow-Up Instructions: No follow-ups on file.   Orders:  Orders Placed This Encounter  Procedures   XR Scapula Left   XR Elbow 2 Views Left   No orders of the defined types were placed in this encounter.     Procedures: No procedures performed   Clinical Data: No additional findings.   Subjective: Chief Complaint  Patient presents with   left scapula fracture    DOI 05/16/2020    HPI patient injured himself 3 to 4 weeks ago when he fell down the stairs on 05/16/2020 with left shoulder pain.  He has had pain if he puts weight on his left upper extremity some pain that radiates to the elbow with particular motion.  He states his entire left arm is better as long as he rested on a pillow.  Review of Systems positive for type 2 diabetes with hyperglycemia ulnar neuropathy.  Positive history of depression and PAT.   Objective: Vital Signs: BP 128/67   Pulse 70   Ht _0  (1.88 m)   Wt 175 lb (79.4 kg)   BMI 22.47 kg/m   Physical Exam Constitutional:      Appearance: He is well-developed.  HENT:     Head: Normocephalic and atraumatic.     Right Ear: External ear normal.     Left Ear: External ear normal.  Eyes:     Pupils: Pupils are equal, round, and reactive to light.  Neck:     Thyroid: No thyromegaly.     Trachea: No tracheal deviation.  Cardiovascular:     Rate and  Rhythm: Normal rate.  Pulmonary:     Effort: Pulmonary effort is normal.     Breath sounds: No wheezing.  Abdominal:     General: Bowel sounds are normal.     Palpations: Abdomen is soft.  Musculoskeletal:     Cervical back: Neck supple.  Skin:    General: Skin is warm and dry.     Capillary Refill: Capillary refill takes less than 2 seconds.  Neurological:     Mental Status: He is alert and oriented to person, place, and time.  Psychiatric:        Behavior: Behavior normal.        Thought Content: Thought content normal.        Judgment: Judgment normal.    Ortho Exam patient has for percent range of motion of his shoulder with minimal discomfort after those are met he has increased pain anterior posterior in the shoulder.  Specialty Comments:  No specialty comments available.  Imaging: No results found.   PMFS History: Patient Active Problem List   Diagnosis Date Noted  Pressure injury of skin 05/24/2020   Fall (on) (from) other stairs and steps, initial encounter 05/16/2020   Closed left scapular fracture 05/16/2020   Hypoxia 05/16/2020   BPH with obstruction/lower urinary tract symptoms 03/16/2020   PAF (paroxysmal atrial fibrillation) ([place]) 03/16/2020   Viral wart on finger 03/16/2020   Type 1 diabetes mellitus with diabetic polyneuropathy ([place]) 01/27/2020   Anemia 01/13/2020   Encounter for general adult medical examination with abnormal findings 01/13/2020   Hyperlipidemia LDL goal <70 01/13/2020   Benign prostatic hyperplasia without lower urinary tract symptoms 01/13/2020   Claudication of both lower extremities ([place]) 01/13/2020   Coronary artery disease involving bypass graft of transplanted heart without angina pectoris 01/13/2020   Paroxysmal atrial fibrillation ([place]) 01/13/2020   Recurrent major depressive disorder, in partial remission ([place]) 01/13/2020   Aortic dissection ([place]) 02/05/2019   Coronary artery disease 02/05/2019   Hyperlipidemia 02/05/2019    Hypertension 02/05/2019   Psoriasis 02/05/2019   Vitamin D deficiency 01/07/2019   Uncontrolled type 2 diabetes mellitus with hyperglycemia ([place]) 01/07/2019   Microalbuminuria 01/07/2019   Depression 06/10/2018   DKA (diabetic ketoacidoses) 06/10/2018   Non-intractable vomiting 06/10/2018   Patellar tendonitis of right knee 04/24/2018   Ulnar neuropathy of left upper extremity 07/12/2017   Peyronie's disease 06/09/2015   Erectile dysfunction 03/15/2015   S/P aortic aneurysm repair 05/19/2012   S/P CABG (coronary artery bypass graft) 05/19/2012   Non-proliferative diabetic retinopathy ([place]) 02/27/2011   Contracture of hand joint 11/01/2010   Dupuytren's contracture of both hands 11/01/2010   Past Medical History:  Diagnosis Date   Anxiety    Arthritis    Constipation    pt states goes EOD- hard stools    Coronary artery disease    Depression    Diabetes mellitus without complication ([place])    DKA (diabetic ketoacidoses)    Hyperlipidemia    Hypertension    off meds   Psoriasis     Family History  Problem Relation Age of Onset   Lymphoma Father    Cancer Paternal Grandmother        Colon Cancer   Colon cancer Paternal Grandmother    Colon polyps Neg Hx    Esophageal cancer Neg Hx    Rectal cancer Neg Hx    Stomach cancer Neg Hx     Past Surgical History:  Procedure Laterality Date   ANKLE SURGERY     right   CARDIAC CATHETERIZATION  09/25/2001   COLONOSCOPY     ~10 yr ago- normal  per pt    CORONARY ARTERY BYPASS GRAFT  09/30/2001   CABG x 4   IR THORACENTESIS ASP PLEURAL SPACE W/IMG GUIDE  05/19/2020   LAPAROSCOPIC INGUINAL HERNIA REPAIR Bilateral 02/09/2010   MASS EXCISION Left 03/12/2013   Procedure: LEFT INDEX EXCISION MASS AND DEBRIDMENT DISTAL INTERPHALANGEAL JOINT;  Surgeon: Tennis Must, MD;  Location: [facility];  Service: Orthopedics;  Laterality: Left;   Social History   Occupational History   Occupation: Retired  Tobacco Use    Smoking status: Current Some Day Smoker    Types: Cigars   Smokeless tobacco: Never Used  Scientific laboratory technician Use: Never used  Substance and Sexual Activity   Alcohol use: Yes    Comment: daily beer or scotch    Drug use: No   Sexual activity: Yes

## 2020-06-09 DIAGNOSIS — I10 Essential (primary) hypertension: Secondary | ICD-10-CM | POA: Diagnosis not present

## 2020-06-09 DIAGNOSIS — E871 Hypo-osmolality and hyponatremia: Secondary | ICD-10-CM | POA: Diagnosis not present

## 2020-06-09 DIAGNOSIS — J690 Pneumonitis due to inhalation of food and vomit: Secondary | ICD-10-CM | POA: Diagnosis not present

## 2020-06-09 DIAGNOSIS — E118 Type 2 diabetes mellitus with unspecified complications: Secondary | ICD-10-CM | POA: Diagnosis not present

## 2020-06-09 DIAGNOSIS — F338 Other recurrent depressive disorders: Secondary | ICD-10-CM | POA: Diagnosis not present

## 2020-06-09 DIAGNOSIS — S42109D Fracture of unspecified part of scapula, unspecified shoulder, subsequent encounter for fracture with routine healing: Secondary | ICD-10-CM | POA: Diagnosis not present

## 2020-06-09 DIAGNOSIS — I480 Paroxysmal atrial fibrillation: Secondary | ICD-10-CM | POA: Diagnosis not present

## 2020-06-09 DIAGNOSIS — L89152 Pressure ulcer of sacral region, stage 2: Secondary | ICD-10-CM | POA: Diagnosis not present

## 2020-06-09 DIAGNOSIS — D6489 Other specified anemias: Secondary | ICD-10-CM | POA: Diagnosis not present

## 2020-06-11 DIAGNOSIS — K5904 Chronic idiopathic constipation: Secondary | ICD-10-CM | POA: Diagnosis not present

## 2020-06-11 DIAGNOSIS — L89150 Pressure ulcer of sacral region, unstageable: Secondary | ICD-10-CM | POA: Diagnosis not present

## 2020-06-11 DIAGNOSIS — D649 Anemia, unspecified: Secondary | ICD-10-CM | POA: Diagnosis not present

## 2020-06-11 DIAGNOSIS — I10 Essential (primary) hypertension: Secondary | ICD-10-CM | POA: Diagnosis not present

## 2020-06-11 DIAGNOSIS — L89154 Pressure ulcer of sacral region, stage 4: Secondary | ICD-10-CM | POA: Diagnosis not present

## 2020-06-11 DIAGNOSIS — L409 Psoriasis, unspecified: Secondary | ICD-10-CM | POA: Diagnosis not present

## 2020-06-11 DIAGNOSIS — N401 Enlarged prostate with lower urinary tract symptoms: Secondary | ICD-10-CM | POA: Diagnosis not present

## 2020-06-11 DIAGNOSIS — E1142 Type 2 diabetes mellitus with diabetic polyneuropathy: Secondary | ICD-10-CM | POA: Diagnosis not present

## 2020-06-11 DIAGNOSIS — F3341 Major depressive disorder, recurrent, in partial remission: Secondary | ICD-10-CM | POA: Diagnosis not present

## 2020-06-11 DIAGNOSIS — G8929 Other chronic pain: Secondary | ICD-10-CM | POA: Diagnosis not present

## 2020-06-11 DIAGNOSIS — S42102D Fracture of unspecified part of scapula, left shoulder, subsequent encounter for fracture with routine healing: Secondary | ICD-10-CM | POA: Diagnosis not present

## 2020-06-11 DIAGNOSIS — E785 Hyperlipidemia, unspecified: Secondary | ICD-10-CM | POA: Diagnosis not present

## 2020-06-11 DIAGNOSIS — N138 Other obstructive and reflux uropathy: Secondary | ICD-10-CM | POA: Diagnosis not present

## 2020-06-11 DIAGNOSIS — Z79891 Long term (current) use of opiate analgesic: Secondary | ICD-10-CM | POA: Diagnosis not present

## 2020-06-11 DIAGNOSIS — Z7982 Long term (current) use of aspirin: Secondary | ICD-10-CM | POA: Diagnosis not present

## 2020-06-11 DIAGNOSIS — F419 Anxiety disorder, unspecified: Secondary | ICD-10-CM | POA: Diagnosis not present

## 2020-06-11 DIAGNOSIS — Z794 Long term (current) use of insulin: Secondary | ICD-10-CM | POA: Diagnosis not present

## 2020-06-11 DIAGNOSIS — M1990 Unspecified osteoarthritis, unspecified site: Secondary | ICD-10-CM | POA: Diagnosis not present

## 2020-06-11 DIAGNOSIS — I480 Paroxysmal atrial fibrillation: Secondary | ICD-10-CM | POA: Diagnosis not present

## 2020-06-13 DIAGNOSIS — S42109A Fracture of unspecified part of scapula, unspecified shoulder, initial encounter for closed fracture: Secondary | ICD-10-CM | POA: Insufficient documentation

## 2020-06-13 DIAGNOSIS — S42102D Fracture of unspecified part of scapula, left shoulder, subsequent encounter for fracture with routine healing: Secondary | ICD-10-CM | POA: Diagnosis not present

## 2020-06-13 DIAGNOSIS — L89150 Pressure ulcer of sacral region, unstageable: Secondary | ICD-10-CM | POA: Diagnosis not present

## 2020-06-16 DIAGNOSIS — L409 Psoriasis, unspecified: Secondary | ICD-10-CM | POA: Diagnosis not present

## 2020-06-16 DIAGNOSIS — I10 Essential (primary) hypertension: Secondary | ICD-10-CM | POA: Diagnosis not present

## 2020-06-16 DIAGNOSIS — I480 Paroxysmal atrial fibrillation: Secondary | ICD-10-CM | POA: Diagnosis not present

## 2020-06-16 DIAGNOSIS — S42102D Fracture of unspecified part of scapula, left shoulder, subsequent encounter for fracture with routine healing: Secondary | ICD-10-CM | POA: Diagnosis not present

## 2020-06-16 DIAGNOSIS — M1990 Unspecified osteoarthritis, unspecified site: Secondary | ICD-10-CM | POA: Diagnosis not present

## 2020-06-16 DIAGNOSIS — L89154 Pressure ulcer of sacral region, stage 4: Secondary | ICD-10-CM | POA: Diagnosis not present

## 2020-06-16 DIAGNOSIS — Z794 Long term (current) use of insulin: Secondary | ICD-10-CM | POA: Diagnosis not present

## 2020-06-16 DIAGNOSIS — Z79891 Long term (current) use of opiate analgesic: Secondary | ICD-10-CM | POA: Diagnosis not present

## 2020-06-16 DIAGNOSIS — K5904 Chronic idiopathic constipation: Secondary | ICD-10-CM | POA: Diagnosis not present

## 2020-06-16 DIAGNOSIS — L89150 Pressure ulcer of sacral region, unstageable: Secondary | ICD-10-CM | POA: Diagnosis not present

## 2020-06-16 DIAGNOSIS — F3341 Major depressive disorder, recurrent, in partial remission: Secondary | ICD-10-CM | POA: Diagnosis not present

## 2020-06-16 DIAGNOSIS — D649 Anemia, unspecified: Secondary | ICD-10-CM | POA: Diagnosis not present

## 2020-06-16 DIAGNOSIS — N138 Other obstructive and reflux uropathy: Secondary | ICD-10-CM | POA: Diagnosis not present

## 2020-06-16 DIAGNOSIS — Z7982 Long term (current) use of aspirin: Secondary | ICD-10-CM | POA: Diagnosis not present

## 2020-06-16 DIAGNOSIS — E1142 Type 2 diabetes mellitus with diabetic polyneuropathy: Secondary | ICD-10-CM | POA: Diagnosis not present

## 2020-06-16 DIAGNOSIS — F419 Anxiety disorder, unspecified: Secondary | ICD-10-CM | POA: Diagnosis not present

## 2020-06-16 DIAGNOSIS — G8929 Other chronic pain: Secondary | ICD-10-CM | POA: Diagnosis not present

## 2020-06-16 DIAGNOSIS — E785 Hyperlipidemia, unspecified: Secondary | ICD-10-CM | POA: Diagnosis not present

## 2020-06-16 DIAGNOSIS — N401 Enlarged prostate with lower urinary tract symptoms: Secondary | ICD-10-CM | POA: Diagnosis not present

## 2020-06-17 ENCOUNTER — Telehealth: Payer: Self-pay | Admitting: Internal Medicine

## 2020-06-17 NOTE — Telephone Encounter (Signed)
Florentina Jenny- [facility] NP has called stating the pt has been requesting medication to help with his insomnia, pease advise.  Florentina Jenny: [phone]

## 2020-06-17 NOTE — Telephone Encounter (Signed)
Noted  

## 2020-06-17 NOTE — Telephone Encounter (Signed)
I have had a conversation with PCP in regard.  PCP stated that [facility] can prescribe if they deem in necessary. However if pt would like for PCP to prescribe the medication the pt would need an OV to discuss.  I have attempted to contact Florentina Jenny with [facility] but she does not have a VM set up at the number listed below.

## 2020-06-17 NOTE — Telephone Encounter (Signed)
Kayla called back from [facility]  Relayed message from Dr. Jacqualine Mau stated that they would prescribe the patient something for insomnia

## 2020-06-18 DIAGNOSIS — Z79891 Long term (current) use of opiate analgesic: Secondary | ICD-10-CM | POA: Diagnosis not present

## 2020-06-18 DIAGNOSIS — L89150 Pressure ulcer of sacral region, unstageable: Secondary | ICD-10-CM | POA: Diagnosis not present

## 2020-06-18 DIAGNOSIS — I480 Paroxysmal atrial fibrillation: Secondary | ICD-10-CM | POA: Diagnosis not present

## 2020-06-18 DIAGNOSIS — K5904 Chronic idiopathic constipation: Secondary | ICD-10-CM | POA: Diagnosis not present

## 2020-06-18 DIAGNOSIS — F419 Anxiety disorder, unspecified: Secondary | ICD-10-CM | POA: Diagnosis not present

## 2020-06-18 DIAGNOSIS — L89154 Pressure ulcer of sacral region, stage 4: Secondary | ICD-10-CM | POA: Diagnosis not present

## 2020-06-18 DIAGNOSIS — Z794 Long term (current) use of insulin: Secondary | ICD-10-CM | POA: Diagnosis not present

## 2020-06-18 DIAGNOSIS — L409 Psoriasis, unspecified: Secondary | ICD-10-CM | POA: Diagnosis not present

## 2020-06-18 DIAGNOSIS — E1142 Type 2 diabetes mellitus with diabetic polyneuropathy: Secondary | ICD-10-CM | POA: Diagnosis not present

## 2020-06-18 DIAGNOSIS — N138 Other obstructive and reflux uropathy: Secondary | ICD-10-CM | POA: Diagnosis not present

## 2020-06-18 DIAGNOSIS — E785 Hyperlipidemia, unspecified: Secondary | ICD-10-CM | POA: Diagnosis not present

## 2020-06-18 DIAGNOSIS — S42102D Fracture of unspecified part of scapula, left shoulder, subsequent encounter for fracture with routine healing: Secondary | ICD-10-CM | POA: Diagnosis not present

## 2020-06-18 DIAGNOSIS — G8929 Other chronic pain: Secondary | ICD-10-CM | POA: Diagnosis not present

## 2020-06-18 DIAGNOSIS — N401 Enlarged prostate with lower urinary tract symptoms: Secondary | ICD-10-CM | POA: Diagnosis not present

## 2020-06-18 DIAGNOSIS — M1990 Unspecified osteoarthritis, unspecified site: Secondary | ICD-10-CM | POA: Diagnosis not present

## 2020-06-18 DIAGNOSIS — Z7982 Long term (current) use of aspirin: Secondary | ICD-10-CM | POA: Diagnosis not present

## 2020-06-18 DIAGNOSIS — F3341 Major depressive disorder, recurrent, in partial remission: Secondary | ICD-10-CM | POA: Diagnosis not present

## 2020-06-18 DIAGNOSIS — D649 Anemia, unspecified: Secondary | ICD-10-CM | POA: Diagnosis not present

## 2020-06-18 DIAGNOSIS — I10 Essential (primary) hypertension: Secondary | ICD-10-CM | POA: Diagnosis not present

## 2020-06-20 ENCOUNTER — Telehealth: Payer: Self-pay | Admitting: Internal Medicine

## 2020-06-20 NOTE — Telephone Encounter (Signed)
Heather with [facility] called and said that the patient is wanting something to help with sleep. She also said that the patient told her that he is depressed. Please advise   Phone: [phone]

## 2020-06-20 NOTE — Telephone Encounter (Signed)
Error

## 2020-06-21 NOTE — Telephone Encounter (Signed)
Please return call to patient. Patient advised appointment needed in order to possibly be prescribed sleep and pain medication

## 2020-06-21 NOTE — Telephone Encounter (Addendum)
Pt has been advised that he needed an OV. He has been informed that [facility] has been ok'd by PCP to prescribe something to him. He expressed understanding. I have provided him the CB# for Florentina Jenny at [facility] via [place].

## 2020-06-21 NOTE — Telephone Encounter (Signed)
Called Heather informing her that pt would need an OV with PCP via VM.

## 2020-06-22 DIAGNOSIS — L89150 Pressure ulcer of sacral region, unstageable: Secondary | ICD-10-CM | POA: Diagnosis not present

## 2020-06-22 DIAGNOSIS — S42102D Fracture of unspecified part of scapula, left shoulder, subsequent encounter for fracture with routine healing: Secondary | ICD-10-CM | POA: Diagnosis not present

## 2020-06-30 ENCOUNTER — Other Ambulatory Visit: Payer: Self-pay | Admitting: Internal Medicine

## 2020-06-30 ENCOUNTER — Telehealth: Payer: Self-pay | Admitting: Internal Medicine

## 2020-06-30 DIAGNOSIS — E10621 Type 1 diabetes mellitus with foot ulcer: Secondary | ICD-10-CM | POA: Insufficient documentation

## 2020-06-30 NOTE — Telephone Encounter (Signed)
Florentina Jenny a NP with [facility] wants to report a Wound found on the left heel of the foot, wound care nurse was not there today but they have been coming out as well.  Pt Mother wants to know if we can put in a referral for podiatry and for pain management.  Please advise.    Contact- [phone], mother Fay Records

## 2020-06-30 NOTE — Progress Notes (Unsigned)
Diabetic foot

## 2020-07-04 DIAGNOSIS — G8929 Other chronic pain: Secondary | ICD-10-CM | POA: Diagnosis not present

## 2020-07-04 DIAGNOSIS — K5904 Chronic idiopathic constipation: Secondary | ICD-10-CM | POA: Diagnosis not present

## 2020-07-04 DIAGNOSIS — I10 Essential (primary) hypertension: Secondary | ICD-10-CM | POA: Diagnosis not present

## 2020-07-04 DIAGNOSIS — L89154 Pressure ulcer of sacral region, stage 4: Secondary | ICD-10-CM | POA: Diagnosis not present

## 2020-07-04 DIAGNOSIS — F3341 Major depressive disorder, recurrent, in partial remission: Secondary | ICD-10-CM | POA: Diagnosis not present

## 2020-07-04 DIAGNOSIS — E1142 Type 2 diabetes mellitus with diabetic polyneuropathy: Secondary | ICD-10-CM | POA: Diagnosis not present

## 2020-07-04 DIAGNOSIS — L409 Psoriasis, unspecified: Secondary | ICD-10-CM | POA: Diagnosis not present

## 2020-07-04 DIAGNOSIS — I480 Paroxysmal atrial fibrillation: Secondary | ICD-10-CM | POA: Diagnosis not present

## 2020-07-04 DIAGNOSIS — D649 Anemia, unspecified: Secondary | ICD-10-CM | POA: Diagnosis not present

## 2020-07-04 DIAGNOSIS — F419 Anxiety disorder, unspecified: Secondary | ICD-10-CM | POA: Diagnosis not present

## 2020-07-04 DIAGNOSIS — M1990 Unspecified osteoarthritis, unspecified site: Secondary | ICD-10-CM | POA: Diagnosis not present

## 2020-07-04 DIAGNOSIS — N138 Other obstructive and reflux uropathy: Secondary | ICD-10-CM | POA: Diagnosis not present

## 2020-07-04 DIAGNOSIS — S42102D Fracture of unspecified part of scapula, left shoulder, subsequent encounter for fracture with routine healing: Secondary | ICD-10-CM | POA: Diagnosis not present

## 2020-07-04 DIAGNOSIS — Z7982 Long term (current) use of aspirin: Secondary | ICD-10-CM | POA: Diagnosis not present

## 2020-07-04 DIAGNOSIS — E785 Hyperlipidemia, unspecified: Secondary | ICD-10-CM | POA: Diagnosis not present

## 2020-07-04 DIAGNOSIS — Z79891 Long term (current) use of opiate analgesic: Secondary | ICD-10-CM | POA: Diagnosis not present

## 2020-07-04 DIAGNOSIS — N401 Enlarged prostate with lower urinary tract symptoms: Secondary | ICD-10-CM | POA: Diagnosis not present

## 2020-07-04 DIAGNOSIS — Z794 Long term (current) use of insulin: Secondary | ICD-10-CM | POA: Diagnosis not present

## 2020-07-04 DIAGNOSIS — L89150 Pressure ulcer of sacral region, unstageable: Secondary | ICD-10-CM | POA: Diagnosis not present

## 2020-07-05 ENCOUNTER — Ambulatory Visit (INDEPENDENT_AMBULATORY_CARE_PROVIDER_SITE_OTHER): Payer: HMO | Admitting: Internal Medicine

## 2020-07-05 ENCOUNTER — Encounter: Payer: Self-pay | Admitting: Internal Medicine

## 2020-07-05 ENCOUNTER — Other Ambulatory Visit: Payer: Self-pay

## 2020-07-05 VITALS — BP 132/90 | HR 68 | Temp 98.3°F | Resp 18 | Ht 74.0 in | Wt 150.8 lb

## 2020-07-05 DIAGNOSIS — L089 Local infection of the skin and subcutaneous tissue, unspecified: Secondary | ICD-10-CM | POA: Diagnosis not present

## 2020-07-05 DIAGNOSIS — I739 Peripheral vascular disease, unspecified: Secondary | ICD-10-CM | POA: Diagnosis not present

## 2020-07-05 DIAGNOSIS — E10621 Type 1 diabetes mellitus with foot ulcer: Secondary | ICD-10-CM

## 2020-07-05 DIAGNOSIS — L03119 Cellulitis of unspecified part of limb: Secondary | ICD-10-CM

## 2020-07-05 DIAGNOSIS — L97529 Non-pressure chronic ulcer of other part of left foot with unspecified severity: Secondary | ICD-10-CM

## 2020-07-05 DIAGNOSIS — E10628 Type 1 diabetes mellitus with other skin complications: Secondary | ICD-10-CM

## 2020-07-05 MED ORDER — CEPHALEXIN 500 MG PO CAPS: 500.0000 mg | ORAL_CAPSULE | Freq: Three times a day (TID) | ORAL | 0 refills | Status: AC

## 2020-07-05 NOTE — Progress Notes (Signed)
   Subjective:   Patient ID: [name] [name], male    DOB: 1955-01-05, 65 y.o.   MRN: [id number]  HPI The patient is a 65 YO man coming in for several foot wounds with surrounding redness. Is a type 1 diabetic and monitoring sugars and taking mostly mealtime insulin lately. Not always taking lantus at night time sometimes taking 4 to 6 units when he does take it. Some sugars close to 350 but also having some low sugars. They are up and down lately and harder for him to predict. He has wounds on the feet some of which are new and some have been there. He is seeing wound clinic and they felt he had new redness and would need antibiotics. Mother is present and helps to provide history.   Review of Systems  Constitutional: Negative.   HENT: Negative.    Eyes: Negative.   Respiratory:  Negative for cough, chest tightness and shortness of breath.   Cardiovascular:  Negative for chest pain, palpitations and leg swelling.  Gastrointestinal:  Negative for abdominal distention, abdominal pain, constipation, diarrhea, nausea and vomiting.  Musculoskeletal: Negative.   Skin:  Positive for color change, rash and wound. Negative for pallor.  Neurological: Negative.   Psychiatric/Behavioral: Negative.     Objective:  Physical Exam Constitutional:      Appearance: He is well-developed.  HENT:     Head: Normocephalic and atraumatic.  Cardiovascular:     Rate and Rhythm: Normal rate and regular rhythm.  Pulmonary:     Effort: Pulmonary effort is normal. No respiratory distress.     Breath sounds: Normal breath sounds. No wheezing or rales.  Abdominal:     General: Bowel sounds are normal. There is no distension.     Palpations: Abdomen is soft.     Tenderness: There is no abdominal tenderness. There is no rebound.  Musculoskeletal:     Cervical back: Normal range of motion.  Skin:    General: Skin is warm and dry.     Comments: Wounds on the left heel (4-5 cm circular skin and fat breakdown  not to layer of muscle or tendon) with surrounding erythema, wound on the left midfoot with surrounding erythema mild oval about 3-4 cm by 1-2 cm) only skin breakdown). Right foot with wound on the foot without much surrounding erythema and skin breakdown. Limited sensation on the feet, poor pedal pulses but good capillary refill bilaterally, toes are cooler compared to midfoot  Neurological:     Mental Status: He is alert and oriented to person, place, and time.     Coordination: Coordination normal.    Vitals:   07/05/20 0834  BP: 132/90  Pulse: 68  Resp: 18  Temp: 98.3 F (36.8 C)  TempSrc: Oral  SpO2: 97%  Weight: 150 lb 12.8 oz (68.4 kg)  Height: 6\' 2"  (1.88 m)    This visit occurred during the SARS-CoV-2 public health emergency.  Safety protocols were in place, including screening questions prior to the visit, additional usage of staff PPE, and extensive cleaning of exam room while observing appropriate contact time as indicated for disinfecting solutions.   Assessment & Plan:

## 2020-07-05 NOTE — Patient Instructions (Signed)
We have sent in keflex for the wounds. Take 1 pill 3 times a day for 10 days.  Come back in about a week so we can check this.  We will get you in with the foot doctor.

## 2020-07-06 ENCOUNTER — Encounter: Payer: Self-pay | Admitting: Orthopaedic Surgery

## 2020-07-06 ENCOUNTER — Ambulatory Visit: Payer: Self-pay

## 2020-07-06 ENCOUNTER — Ambulatory Visit: Payer: HMO | Admitting: Orthopaedic Surgery

## 2020-07-06 VITALS — Ht 74.0 in | Wt 150.0 lb

## 2020-07-06 DIAGNOSIS — M898X1 Other specified disorders of bone, shoulder: Secondary | ICD-10-CM

## 2020-07-06 NOTE — Progress Notes (Signed)
Office Visit Note   Patient: [name] [name]           Date of Birth: [birth year]           MRN: [id number] Visit Date: 07/06/2020              Requested by: Janith Lima, MD [address] [place],  [place] [address] PCP: Janith Lima, MD   Assessment & Plan: Visit Diagnoses:  1. Pain of left scapula     Plan: X-rays show left scapular fracture with callus formation.  Coracoid process remains displaced 1 to 2 cm.  Callus formation is noted in scapular spine.  Interval healing noted.  We will make an appoint with Dr. Sharol Given in a few weeks for his chronic foot ulcers.  He can follow-up with me for his scapular fracture in 2 months.  Follow-Up Instructions: Return in about 2 months (around 09/06/2020).   Orders:  Orders Placed This Encounter  Procedures   XR Scapula Left   No orders of the defined types were placed in this encounter.     Procedures: No procedures performed   Clinical Data: No additional findings.   Subjective: Chief Complaint  Patient presents with   Left Shoulder - Fracture, Follow-up    Fall 05/16/2020    HPI 65 year old male follow-up comminuted left scapular fracture.  He is out of his sling states he can get his arm up over his head is able to demonstrate it.  He states he has pain that continues but not as severe.  He has some discomfort in the neck when he sits for long period time points over the scapular region.  No numbness or tingling in his fingers.  Patient is a type 1 diabetic and has not been as active walking less and use the walker and has bilateral diabetic ulcers on his feet.  He has been gradually walking a little bit more.  And states every week his shoulder is moving a little bit better.  Review of Systems updated unchanged.   Objective: Vital Signs: Ht 6\' 2"  (1.88 m)   Wt 150 lb (68 kg)   BMI 19.26 kg/m   Physical Exam Constitutional:      Appearance: He is well-developed.  HENT:     Head: Normocephalic and  atraumatic.     Right Ear: External ear normal.     Left Ear: External ear normal.  Eyes:     Pupils: Pupils are equal, round, and reactive to light.  Neck:     Thyroid: No thyromegaly.     Trachea: No tracheal deviation.  Cardiovascular:     Rate and Rhythm: Normal rate.  Pulmonary:     Effort: Pulmonary effort is normal.     Breath sounds: No wheezing.  Abdominal:     General: Bowel sounds are normal.     Palpations: Abdomen is soft.  Musculoskeletal:     Cervical back: Neck supple.  Skin:    General: Skin is warm and dry.     Capillary Refill: Capillary refill takes less than 2 seconds.  Neurological:     Mental Status: He is alert and oriented to person, place, and time.  Psychiatric:        Behavior: Behavior normal.        Thought Content: Thought content normal.        Judgment: Judgment normal.    Ortho Exam patient has improved ability to reach his arm up over his  head.  Still has some tenderness over the coracoid.  Wrist pulses normal sensation hand is normal.  Specialty Comments:  No specialty comments available.  Imaging: No results found.   PMFS History: Patient Active Problem List   Diagnosis Date Noted   Cellulitis 07/07/2020   Diabetic ulcer of left foot associated with type 1 diabetes mellitus ([place]) 06/30/2020   Scapula fracture 06/13/2020   Pressure injury of skin 05/24/2020   Fall (on) (from) other stairs and steps, initial encounter 05/16/2020   Closed left scapular fracture 05/16/2020   Hypoxia 05/16/2020   BPH with obstruction/lower urinary tract symptoms 03/16/2020   PAF (paroxysmal atrial fibrillation) ([place]) 03/16/2020   Viral wart on finger 03/16/2020   Type 1 diabetes mellitus with diabetic foot infection ([place]) 01/27/2020   Anemia 01/13/2020   Encounter for general adult medical examination with abnormal findings 01/13/2020   Hyperlipidemia LDL goal <70 01/13/2020   Benign prostatic hyperplasia without lower urinary tract symptoms  01/13/2020   PVD (peripheral vascular disease) ([place] [facility] [place]) 01/13/2020   Coronary artery disease involving bypass graft of transplanted heart without angina pectoris 01/13/2020   Paroxysmal atrial fibrillation ([place]) 01/13/2020   Recurrent major depressive disorder, in partial remission ([place]) 01/13/2020   Aortic dissection ([place]) 02/05/2019   Coronary artery disease 02/05/2019   Hyperlipidemia 02/05/2019   Hypertension 02/05/2019   Psoriasis 02/05/2019   Vitamin D deficiency 01/07/2019   Uncontrolled type 2 diabetes mellitus with hyperglycemia ([place]) 01/07/2019   Microalbuminuria 01/07/2019   Depression 06/10/2018   DKA (diabetic ketoacidoses) 06/10/2018   Non-intractable vomiting 06/10/2018   Patellar tendonitis of right knee 04/24/2018   Ulnar neuropathy of left upper extremity 07/12/2017   Peyronie's disease 06/09/2015   Erectile dysfunction 03/15/2015   S/P aortic aneurysm repair 05/19/2012   S/P CABG (coronary artery bypass graft) 05/19/2012   Non-proliferative diabetic retinopathy ([place]) 02/27/2011   Contracture of hand joint 11/01/2010   Dupuytren's contracture of both hands 11/01/2010   Past Medical History:  Diagnosis Date   Anxiety    Arthritis    Constipation    pt states goes EOD- hard stools    Coronary artery disease    Depression    Diabetes mellitus without complication ([place])    DKA (diabetic ketoacidoses)    Hyperlipidemia    Hypertension    off meds   Psoriasis     Family History  Problem Relation Age of Onset   Lymphoma Father    Cancer Paternal Grandmother        Colon Cancer   Colon cancer Paternal Grandmother    Colon polyps Neg Hx    Esophageal cancer Neg Hx    Rectal cancer Neg Hx    Stomach cancer Neg Hx     Past Surgical History:  Procedure Laterality Date   ANKLE SURGERY     right   CARDIAC CATHETERIZATION  09/25/2001   COLONOSCOPY     ~10 yr ago- normal  per pt    CORONARY ARTERY BYPASS GRAFT  09/30/2001   CABG x 4   IR THORACENTESIS ASP  PLEURAL SPACE W/IMG GUIDE  05/19/2020   LAPAROSCOPIC INGUINAL HERNIA REPAIR Bilateral 02/09/2010   MASS EXCISION Left 03/12/2013   Procedure: LEFT INDEX EXCISION MASS AND DEBRIDMENT DISTAL INTERPHALANGEAL JOINT;  Surgeon: Tennis Must, MD;  Location: [facility];  Service: Orthopedics;  Laterality: Left;   Social History   Occupational History   Occupation: Retired  Tobacco Use   Smoking status: Some Days  Pack years: 0.00    Types: Cigars   Smokeless tobacco: Never  Vaping Use   Vaping Use: Never used  Substance and Sexual Activity   Alcohol use: Yes    Comment: daily beer or scotch    Drug use: No   Sexual activity: Yes

## 2020-07-07 ENCOUNTER — Other Ambulatory Visit: Payer: Self-pay

## 2020-07-07 ENCOUNTER — Encounter (HOSPITAL_BASED_OUTPATIENT_CLINIC_OR_DEPARTMENT_OTHER): Payer: HMO | Attending: Internal Medicine | Admitting: Internal Medicine

## 2020-07-07 DIAGNOSIS — E1051 Type 1 diabetes mellitus with diabetic peripheral angiopathy without gangrene: Secondary | ICD-10-CM | POA: Diagnosis not present

## 2020-07-07 DIAGNOSIS — E1042 Type 1 diabetes mellitus with diabetic polyneuropathy: Secondary | ICD-10-CM | POA: Insufficient documentation

## 2020-07-07 DIAGNOSIS — I10 Essential (primary) hypertension: Secondary | ICD-10-CM | POA: Diagnosis not present

## 2020-07-07 DIAGNOSIS — L0390 Cellulitis, unspecified: Secondary | ICD-10-CM | POA: Insufficient documentation

## 2020-07-07 DIAGNOSIS — L89622 Pressure ulcer of left heel, stage 2: Secondary | ICD-10-CM | POA: Insufficient documentation

## 2020-07-07 DIAGNOSIS — L97522 Non-pressure chronic ulcer of other part of left foot with fat layer exposed: Secondary | ICD-10-CM | POA: Diagnosis not present

## 2020-07-07 DIAGNOSIS — L89623 Pressure ulcer of left heel, stage 3: Secondary | ICD-10-CM | POA: Diagnosis not present

## 2020-07-07 DIAGNOSIS — R531 Weakness: Secondary | ICD-10-CM | POA: Diagnosis not present

## 2020-07-07 DIAGNOSIS — L97529 Non-pressure chronic ulcer of other part of left foot with unspecified severity: Secondary | ICD-10-CM | POA: Diagnosis present

## 2020-07-07 DIAGNOSIS — L97511 Non-pressure chronic ulcer of other part of right foot limited to breakdown of skin: Secondary | ICD-10-CM | POA: Insufficient documentation

## 2020-07-07 DIAGNOSIS — L97528 Non-pressure chronic ulcer of other part of left foot with other specified severity: Secondary | ICD-10-CM | POA: Insufficient documentation

## 2020-07-07 DIAGNOSIS — Z7901 Long term (current) use of anticoagulants: Secondary | ICD-10-CM | POA: Diagnosis not present

## 2020-07-07 DIAGNOSIS — E10621 Type 1 diabetes mellitus with foot ulcer: Secondary | ICD-10-CM | POA: Diagnosis not present

## 2020-07-07 DIAGNOSIS — E11621 Type 2 diabetes mellitus with foot ulcer: Secondary | ICD-10-CM | POA: Diagnosis not present

## 2020-07-07 DIAGNOSIS — I4891 Unspecified atrial fibrillation: Secondary | ICD-10-CM | POA: Insufficient documentation

## 2020-07-07 DIAGNOSIS — L97512 Non-pressure chronic ulcer of other part of right foot with fat layer exposed: Secondary | ICD-10-CM | POA: Diagnosis not present

## 2020-07-07 DIAGNOSIS — Z951 Presence of aortocoronary bypass graft: Secondary | ICD-10-CM | POA: Diagnosis not present

## 2020-07-07 DIAGNOSIS — I2510 Atherosclerotic heart disease of native coronary artery without angina pectoris: Secondary | ICD-10-CM | POA: Diagnosis not present

## 2020-07-07 DIAGNOSIS — E785 Hyperlipidemia, unspecified: Secondary | ICD-10-CM | POA: Insufficient documentation

## 2020-07-07 NOTE — Assessment & Plan Note (Signed)
Review of recent blood flow study with moderate limitation of blood flow left foot at the level of the ankle and with both feet toe index was abnormal. I do have concerns about healing of these wounds on his feet and offered dedicated arterial blood flow assessment but patient and mother decline today.

## 2020-07-07 NOTE — Progress Notes (Signed)
NISSIM, FLEISCHER ([id number]) Visit Report for 07/07/2020 Chief Complaint Document Details Patient Name: Date of Service: [name] [name] [facility] THA N L. 07/07/2020 7:30 A M Medical Record Number: [id number] Patient Account Number: [id number] Date of Birth/Sex: Treating RN: [birth year] (65 y.o. Male) [name] Primary Care Provider: [name] Other Clinician: Referring Provider: Treating Provider/Extender: [name] in Treatment: 0 Information Obtained from: Patient Chief Complaint 07/07/2020; patient is in clinic for today for review of pressure ulcers on his coccyx and left heel and diabetic foot ulcers bilaterally. Electronic Signature(s) Signed: 07/07/2020 8:35:52 PM By: Linton Ham MD Entered By: Linton Ham on 07/07/2020 43:15:40 -------------------------------------------------------------------------------- Debridement Details Patient Name: Date of Service: [name] [name] [facility] THA N L. 07/07/2020 7:30 A M Medical Record Number: [id number] Patient Account Number: [id number] Date of Birth/Sex: Treating RN: 03-24-1955 (65 y.o. Male) [name] Primary Care Provider: [name] Other Clinician: Referring Provider: Treating Provider/Extender: [name] in Treatment: 0 Debridement Performed for Assessment: Wound #1 Left Calcaneus Performed By: Physician Ricard Dillon., MD Debridement Type: Debridement Level of Consciousness (Pre-procedure): Awake and Alert Pre-procedure Verification/Time Out Yes - 09:10 Taken: Start Time: 09:10 Pain Control: Lidocaine 4% T opical Solution T Area Debrided (L x W): otal 4 (cm) x 4 (cm) = 16 (cm) Tissue and other material debrided: Viable, Non-Viable, Callus, Slough, Subcutaneous, Skin: Dermis , Skin: Epidermis, Fibrin/Exudate, Slough Level: Skin/Subcutaneous Tissue Debridement Description: Excisional Instrument: Curette Bleeding: Minimum Hemostasis Achieved: Pressure End Time:  09:13 Procedural Pain: 0 Post Procedural Pain: 0 Response to Treatment: Procedure was tolerated well Level of Consciousness (Post- Awake and Alert procedure): Post Debridement Measurements of Total Wound Length: (cm) 4 Stage: Category/Stage III Width: (cm) 4 Depth: (cm) 0.2 Volume: (cm) 2.513 Character of Wound/Ulcer Post Debridement: Improved Post Procedure Diagnosis Same as Pre-procedure Electronic Signature(s) Signed: 07/07/2020 6:24:11 PM By: [name] Signed: 07/07/2020 8:35:52 PM By: Linton Ham MD Entered By: Linton Ham on 07/07/2020 09:28:52 -------------------------------------------------------------------------------- Debridement Details Patient Name: Date of Service: [name] [name] [facility] THA N L. 07/07/2020 7:30 A M Medical Record Number: [id number] Patient Account Number: [id number] Date of Birth/Sex: Treating RN: [birth year] (65 y.o. Male) [name] Primary Care Provider: [name] Other Clinician: Referring Provider: Treating Provider/Extender: [name] in Treatment: 0 Debridement Performed for Assessment: Wound #2 Left,Lateral Foot Performed By: Physician Ricard Dillon., MD Debridement Type: Debridement Severity of Tissue Pre Debridement: Fat layer exposed Level of Consciousness (Pre-procedure): Awake and Alert Pre-procedure Verification/Time Out Yes - 09:10 Taken: Start Time: 09:10 Pain Control: Lidocaine 4% T opical Solution T Area Debrided (L x W): otal 2 (cm) x 1 (cm) = 2 (cm) Tissue and other material debrided: Viable, Non-Viable, Callus, Slough, Subcutaneous, Skin: Dermis , Skin: Epidermis, Fibrin/Exudate, Slough Level: Skin/Subcutaneous Tissue Debridement Description: Excisional Instrument: Curette Bleeding: Minimum Hemostasis Achieved: Pressure End Time: 09:13 Procedural Pain: 0 Post Procedural Pain: 0 Response to Treatment: Procedure was tolerated well Level of Consciousness (Post- Awake and  Alert procedure): Post Debridement Measurements of Total Wound Length: (cm) 1.5 Width: (cm) 0.6 Depth: (cm) 0.2 Volume: (cm) 0.141 Character of Wound/Ulcer Post Debridement: Improved Severity of Tissue Post Debridement: Fat layer exposed Post Procedure Diagnosis Same as Pre-procedure Electronic Signature(s) Signed: 07/07/2020 6:24:11 PM By: [name] Signed: 07/07/2020 8:35:52 PM By: Linton Ham MD Entered By: Linton Ham on 07/07/2020 09:29:01 -------------------------------------------------------------------------------- HPI Details Patient Name: Date of Service: [name] [name] [facility] THA N L. 07/07/2020 7:30 A M Medical Record  Number: [id number] Patient Account Number: [id number] Date of Birth/Sex: Treating RN: 1955/06/16 (65 y.o. Male) [name] Primary Care Provider: [name] Other Clinician: Referring Provider: Treating Provider/Extender: [name] in Treatment: 0 History of Present Illness HPI Description: ADMISSION 07/07/2020 This is a 65 year old man who is a type II diabetic. I have reviewed this in epic. It appeared that his diabetes was diagnosed in 2009 at which time he was on metformin. He was followed for a period of time by an endocrinologist with [facility] to always labeled him as a type II diabetic. Recently in epic there is been a switch to type 1 diabetes. If there were defining antibody test done I do not see them and I think the totality of the evidence would suggest that he is a type II diabetic. His recent problem started in May when he fell down 17 stairs. He suffered a scapular fracture he was hospitalized at [facility] for 9 days and then discharged to [facility]. His mother who is present said that most of these wounds seem to develop surrounding the discharge from hospital or the stay at [facility]. He has an area on his lower sacrum which appears currently to be a stage II but per their description this is actually improved  quite a bit. He has an area on the left lateral foot at roughly the base of the fifth metatarsal, the left Achilles heel and the right fifth metatarsal head laterally they have been using topical antibiotics. Seen by primary care yesterday and placed on Keflex The patient had arterial studies on 05/05/2020. I wondered whether this had something to do with wounds on his feet at that time but I have not been able to determine this. At that time his ABI on the right was 1.09 with a TBI of 0.65 on the left 0.88 with a TBI of 0.44. Waveforms were triphasic on the right monophasic on the left. He likely has significant PAD on the left Past medical history includes type 2 diabetes poorly controlled last hemoglobin A1c at 12.7 in May, hypertension, hyperlipidemia, atrial fib on Eliquis, coronary artery disease status post CABG, left scapular fracture in May of this year, AAA repair. Bilateral leg weakness ABIs were not done in our clinic as they were just done in [facility] this [facility] Electronic Signature(s) Signed: 07/07/2020 8:35:52 PM By: Linton Ham MD Entered By: Linton Ham on 07/07/2020 09:36:00 -------------------------------------------------------------------------------- Physical Exam Details Patient Name: Date of Service: [name] [name] [facility] THA N L. 07/07/2020 7:30 A M Medical Record Number: [id number] Patient Account Number: [id number] Date of Birth/Sex: Treating RN: October 20, 1955 (65 y.o. Male) [name] Primary Care Provider: [name] Other Clinician: Referring Provider: Treating Provider/Extender: [name] in Treatment: 0 Constitutional Sitting or standing Blood Pressure is within target range for patient.. Pulse regular and within target range for patient.Marland Kitchen Respirations regular, non-labored and within target range.. Temperature is normal and within the target range for the patient.Marland Kitchen Appears in no distress. Respiratory work of breathing  is normal. Cardiovascular Pedal pulses palpable at the dorsalis pedis and less so at the posterior tibial although they are certainly not vibrant.. Neurological He did well with the monofilament but very poorly with vibration sense. Psychiatric Patient appears depressed today.. Notes Wound exam; On the left foot he has an area at the base of the fifth metatarsal which required debridement with a #5 curette fairly brisk bleeding controlled with direct pressure. Fairly large  area on the Achilles left heel also required debridement subcutaneous tissue to remove a completely nonviable surface. Neither 1 of these areas goes to bone and they do not appear to be infected On the right foot at the lateral part of the fifth metatarsal he has a dime size shaped wound. There is some debris on the surface but not as bad as on the left. I did not debride this. Sacral area stage I wound surrounded by erythema suggesting nonblanchable pressure stage I Electronic Signature(s) Signed: 07/07/2020 8:35:52 PM By: Linton Ham MD Entered By: Linton Ham on 07/07/2020 09:35:29 -------------------------------------------------------------------------------- Physician Orders Details Patient Name: Date of Service: [name] [name] [facility] THA N L. 07/07/2020 7:30 A M Medical Record Number: [id number] Patient Account Number: [id number] Date of Birth/Sex: Treating RN: 1955/01/14 (65 y.o. Male) [name] Primary Care Provider: [name] Other Clinician: Referring Provider: Treating Provider/Extender: [name] in Treatment: 0 Verbal / Phone Orders: No Diagnosis Coding Follow-up Appointments ppointment in 1 week. - Dr. Dellia Nims Return A Bathing/ Shower/ Hygiene May shower and wash wound with soap and water. - with dressing changes only. Off-Loading Wedge shoe to: - wound center to provide heel sandals glpohed for both feet. wear while walking. Turn and reposition every 2  hours Other: - float heels while resting in bed or chair. Use a pillow or rolled sheet under legs to offload the heels. Home Health New wound care orders this week; continue Home Health for wound care. May utilize formulary equivalent dressing for wound treatment orders unless otherwise specified. - [facility] home health twice a week. Wound Treatment Wound #1 - Calcaneus Wound Laterality: Left Cleanser: Soap and Water (Home Health) 3 x Per Week/30 Days Discharge Instructions: May shower and wash wound with dial antibacterial soap and water prior to dressing change. Cleanser: Wound Cleanser (Home Health) 3 x Per Week/30 Days Discharge Instructions: Cleanse the wound with wound cleanser prior to applying a clean dressing using gauze sponges, not tissue or cotton balls. Prim Dressing: Iodosorb Gel 10 (gm) Tube (Home Health) 3 x Per Week/30 Days ary Discharge Instructions: ****IODOFLEX OR IODOSORB****Apply to wound bed as instructed Secondary Dressing: Woven Gauze Sponge, Non-Sterile 4x4 in (Home Health) 3 x Per Week/30 Days Discharge Instructions: Apply over primary dressing as directed. Secondary Dressing: ALLEVYN Heel 4 1/2in x 5 1/2in / 10.5cm x 13.5cm (Home Health) 3 x Per Week/30 Days Discharge Instructions: Apply over primary dressing as directed. Secured With: [employer], 4.5x3.1 (in/yd) (Home Health) 3 x Per Week/30 Days Discharge Instructions: Secure with Kerlix as directed. Secured With: 4M Medipore H Soft Cloth Surgical T 4 x 2 (in/yd) (Home Health) 3 x Per Week/30 Days ape Discharge Instructions: Secure dressing with tape as directed. Wound #2 - Foot Wound Laterality: Left, Lateral Cleanser: Soap and Water (Home Health) 3 x Per Week/30 Days Discharge Instructions: May shower and wash wound with dial antibacterial soap and water prior to dressing change. Cleanser: Wound Cleanser (Home Health) 3 x Per Week/30 Days Discharge Instructions: Cleanse the wound with wound cleanser  prior to applying a clean dressing using gauze sponges, not tissue or cotton balls. Prim Dressing: Iodosorb Gel 10 (gm) Tube (Home Health) 3 x Per Week/30 Days ary Discharge Instructions: ****IODOFLEX OR IODOSORB****Apply to wound bed as instructed Secondary Dressing: Woven Gauze Sponge, Non-Sterile 4x4 in (Home Health) 3 x Per Week/30 Days Discharge Instructions: Apply over primary dressing as directed. Secured With: [employer], 4.5x3.1 (in/yd) (Home Health) 3 x Per Week/30  Days Discharge Instructions: Secure with Kerlix as directed. Secured With: 105M Medipore H Soft Cloth Surgical T 4 x 2 (in/yd) (Home Health) 3 x Per Week/30 Days ape Discharge Instructions: Secure dressing with tape as directed. Wound #3 - Foot Wound Laterality: Right, Lateral Cleanser: Soap and Water (Home Health) 3 x Per Week/30 Days Discharge Instructions: May shower and wash wound with dial antibacterial soap and water prior to dressing change. Cleanser: Wound Cleanser (Home Health) 3 x Per Week/30 Days Discharge Instructions: Cleanse the wound with wound cleanser prior to applying a clean dressing using gauze sponges, not tissue or cotton balls. Prim Dressing: Iodosorb Gel 10 (gm) Tube (Home Health) 3 x Per Week/30 Days ary Discharge Instructions: ****IODOFLEX OR IODOSORB****Apply to wound bed as instructed Secondary Dressing: Woven Gauze Sponge, Non-Sterile 4x4 in (Home Health) 3 x Per Week/30 Days Discharge Instructions: Apply over primary dressing as directed. Secured With: [employer], 4.5x3.1 (in/yd) (Home Health) 3 x Per Week/30 Days Discharge Instructions: Secure with Kerlix as directed. Secured With: 105M Medipore H Soft Cloth Surgical T 4 x 2 (in/yd) (Home Health) 3 x Per Week/30 Days ape Discharge Instructions: Secure dressing with tape as directed. Wound #4 - Sacrum Cleanser: Soap and Water (Home Health) 3 x Per Week/30 Days Discharge Instructions: May shower and wash wound with dial  antibacterial soap and water prior to dressing change. Peri-Wound Care: Skin Prep (Home Health) 3 x Per Week/30 Days Discharge Instructions: Use skin prep as directed Prim Dressing: KerraCel Ag Gelling Fiber Dressing, 2x2 in (silver alginate) (Home Health) 3 x Per Week/30 Days ary Discharge Instructions: Apply silver alginate to wound bed as instructed Secondary Dressing: Zetuvit Plus Silicone Border Dressing 4x4 (in/in) ([place]) 3 x Per Week/30 Days Discharge Instructions: Apply silicone border over primary dressing as directed. Electronic Signature(s) Signed: 07/07/2020 6:24:11 PM By: [name] Signed: 07/07/2020 8:35:52 PM By: Linton Ham MD Entered By: [name] on 07/07/2020 09:20:38 -------------------------------------------------------------------------------- Problem List Details Patient Name: Date of Service: [name] [name] [facility] THA N L. 07/07/2020 7:30 A M Medical Record Number: [id number] Patient Account Number: [id number] Date of Birth/Sex: Treating RN: 1955-04-05 (65 y.o. Male) Rolin Barry, [place] Primary Care Provider: [name] Other Clinician: Referring Provider: Treating Provider/Extender: [name] in Treatment: 0 Active Problems ICD-10 Encounter Code Description Active Date MDM Diagnosis E11.621 Type 2 diabetes mellitus with foot ulcer 07/07/2020 No Yes L97.528 Non-pressure chronic ulcer of other part of left foot with other specified 07/07/2020 No Yes severity L97.511 Non-pressure chronic ulcer of other part of right foot limited to breakdown of 07/07/2020 No Yes skin L89.622 Pressure ulcer of left heel, stage 2 07/07/2020 No Yes L89.152 Pressure ulcer of sacral region, stage 2 07/07/2020 No Yes E11.42 Type 2 diabetes mellitus with diabetic polyneuropathy 07/07/2020 No Yes Inactive Problems Resolved Problems Electronic Signature(s) Signed: 07/07/2020 8:35:52 PM By: Linton Ham MD Entered By: Linton Ham on 07/07/2020  09:28:33 -------------------------------------------------------------------------------- Progress Note Details Patient Name: Date of Service: [name] [name] [facility] THA N L. 07/07/2020 7:30 A M Medical Record Number: [id number] Patient Account Number: [id number] Date of Birth/Sex: Treating RN: 1955/10/31 (65 y.o. Male) [name] Primary Care Provider: [name] Other Clinician: Referring Provider: Treating Provider/Extender: [name] in Treatment: 0 Subjective Chief Complaint Information obtained from Patient 07/07/2020; patient is in clinic for today for review of pressure ulcers on his coccyx and left heel and diabetic foot ulcers bilaterally. History of Present Illness (HPI) ADMISSION 07/07/2020 This is a  65 year old man who is a type II diabetic. I have reviewed this in epic. It appeared that his diabetes was diagnosed in 2009 at which time he was on metformin. He was followed for a period of time by an endocrinologist with [facility] to always labeled him as a type II diabetic. Recently in epic there is been a switch to type 1 diabetes. If there were defining antibody test done I do not see them and I think the totality of the evidence would suggest that he is a type II diabetic. His recent problem started in May when he fell down 17 stairs. He suffered a scapular fracture he was hospitalized at [facility] for 9 days and then discharged to [facility]. His mother who is present said that most of these wounds seem to develop surrounding the discharge from hospital or the stay at [facility]. He has an area on his lower sacrum which appears currently to be a stage II but per their description this is actually improved quite a bit. He has an area on the left lateral foot at roughly the base of the fifth metatarsal, the left Achilles heel and the right fifth metatarsal head laterally they have been using topical antibiotics. Seen by primary care yesterday and placed on  Keflex The patient had arterial studies on 05/05/2020. I wondered whether this had something to do with wounds on his feet at that time but I have not been able to determine this. At that time his ABI on the right was 1.09 with a TBI of 0.65 on the left 0.88 with a TBI of 0.44. Waveforms were triphasic on the right monophasic on the left. He likely has significant PAD on the left Past medical history includes type 2 diabetes poorly controlled last hemoglobin A1c at 12.7 in May, hypertension, hyperlipidemia, atrial fib on Eliquis, coronary artery disease status post CABG, left scapular fracture in May of this year, AAA repair. Bilateral leg weakness ABIs were not done in our clinic as they were just done in May of this [facility] at [facility] Patient History Information obtained from Patient. Allergies No Known Allergies Family History Cancer - Paternal Grandparents,Father, Heart Disease - Father, Hypertension - Father, No family history of Diabetes, Hereditary Spherocytosis, Kidney Disease, Lung Disease, Seizures, Stroke, Thyroid Problems, Tuberculosis. Social History Current some day smoker - Landscape architect, Marital Status - Divorced, Alcohol Use - Rarely, Drug Use - No History, Caffeine Use - Daily. Medical History Cardiovascular Patient has history of Coronary Artery Disease, Hypertension Endocrine Patient has history of Type II Diabetes Musculoskeletal Patient has history of Osteoarthritis Neurologic Patient has history of Neuropathy Patient is treated with Insulin. Blood sugar is tested. Review of Systems (ROS) Eyes Complains or has symptoms of Glasses / Contacts. Ear/Nose/Mouth/Throat Denies complaints or symptoms of Chronic sinus problems or rhinitis. Respiratory Denies complaints or symptoms of Chronic or frequent coughs, Shortness of Breath. Gastrointestinal Denies complaints or symptoms of Frequent diarrhea, Nausea, Vomiting. Genitourinary Denies complaints or symptoms of  Frequent urination. Integumentary (Skin) Complains or has symptoms of Wounds. Psychiatric Denies complaints or symptoms of Claustrophobia, Suicidal. Objective Constitutional Sitting or standing Blood Pressure is within target range for patient.. Pulse regular and within target range for patient.Marland Kitchen Respirations regular, non-labored and within target range.. Temperature is normal and within the target range for the patient.Marland Kitchen Appears in no distress. Vitals Time Taken: 8:00 AM, Height: 74 in, Source: Stated, Weight: 151 lbs, Source: Stated, BMI: 19.4, Temperature: 98.4 F, Pulse: 71 bpm, Respiratory Rate:  18 breaths/min, Blood Pressure: 126/81 mmHg. Respiratory work of breathing is normal. Cardiovascular Pedal pulses palpable at the dorsalis pedis and less so at the posterior tibial although they are certainly not vibrant.. Neurological He did well with the monofilament but very poorly with vibration sense. Psychiatric Patient appears depressed today.. General Notes: Wound exam; oo On the left foot he has an area at the base of the fifth metatarsal which required debridement with a #5 curette fairly brisk bleeding controlled with direct pressure. Fairly large area on the Achilles left heel also required debridement subcutaneous tissue to remove a completely nonviable surface. Neither 1 of these areas goes to bone and they do not appear to be infected oo On the right foot at the lateral part of the fifth metatarsal he has a dime size shaped wound. There is some debris on the surface but not as bad as on the left. I did not debride this. oo Sacral area stage I wound surrounded by erythema suggesting nonblanchable pressure stage I Integumentary (Hair, Skin) Wound #1 status is Open. Original cause of wound was Pressure Injury. The date acquired was: 05/23/2020. The wound is located on the Left Calcaneus. The wound measures 4cm length x 4cm width x 0.2cm depth; 12.566cm^2 area and 2.513cm^3  volume. There is Fat Layer (Subcutaneous Tissue) exposed. There is no tunneling or undermining noted. There is a medium amount of serosanguineous drainage noted. The wound margin is distinct with the outline attached to the wound base. There is medium (34-66%) red granulation within the wound bed. There is a medium (34-66%) amount of necrotic tissue within the wound bed including Eschar and Adherent Slough. Wound #2 status is Open. Original cause of wound was Gradually Appeared. The date acquired was: 06/21/2020. The wound is located on the Left,Lateral Foot. The wound measures 1.5cm length x 0.6cm width x 0.1cm depth; 0.707cm^2 area and 0.071cm^3 volume. There is Fat Layer (Subcutaneous Tissue) exposed. There is no tunneling or undermining noted. There is a medium amount of serosanguineous drainage noted. The wound margin is distinct with the outline attached to the wound base. There is medium (34-66%) red granulation within the wound bed. There is a medium (34-66%) amount of necrotic tissue within the wound bed including Eschar. Wound #3 status is Open. Original cause of wound was Gradually Appeared. The date acquired was: 06/21/2020. The wound is located on the Right,Lateral Foot. The wound measures 1.5cm length x 1.7cm width x 0.2cm depth; 2.003cm^2 area and 0.401cm^3 volume. There is Fat Layer (Subcutaneous Tissue) exposed. There is no tunneling or undermining noted. There is a medium amount of serosanguineous drainage noted. The wound margin is distinct with the outline attached to the wound base. There is large (67-100%) red granulation within the wound bed. There is a small (1-33%) amount of necrotic tissue within the wound bed. General Notes: Calloused Periwound Wound #4 status is Open. Original cause of wound was Pressure Injury. The date acquired was: 05/23/2020. The wound is located on the Sacrum. The wound measures 0.3cm length x 0.3cm width x 0.4cm depth; 0.071cm^2 area and 0.028cm^3  volume. There is Fat Layer (Subcutaneous Tissue) exposed. There is no tunneling or undermining noted. There is a medium amount of serosanguineous drainage noted. The wound margin is distinct with the outline attached to the wound base. There is large (67-100%) red granulation within the wound bed. There is no necrotic tissue within the wound bed. Assessment Active Problems ICD-10 Type 2 diabetes mellitus with foot ulcer Non-pressure chronic ulcer of  other part of left foot with other specified severity Non-pressure chronic ulcer of other part of right foot limited to breakdown of skin Pressure ulcer of left heel, stage 2 Pressure ulcer of sacral region, stage 2 Type 2 diabetes mellitus with diabetic polyneuropathy Procedures Wound #1 Pre-procedure diagnosis of Wound #1 is a Pressure Ulcer located on the Left Calcaneus . There was a Excisional Skin/Subcutaneous Tissue Debridement with a total area of 16 sq cm performed by Ricard Dillon., MD. With the following instrument(s): Curette to remove Viable and Non-Viable tissue/material. Material removed includes Callus, Subcutaneous Tissue, Slough, Skin: Dermis, Skin: Epidermis, and Fibrin/Exudate after achieving pain control using Lidocaine 4% T opical Solution. A time out was conducted at 09:10, prior to the start of the procedure. A Minimum amount of bleeding was controlled with Pressure. The procedure was tolerated well with a pain level of 0 throughout and a pain level of 0 following the procedure. Post Debridement Measurements: 4cm length x 4cm width x 0.2cm depth; 2.513cm^3 volume. Post debridement Stage noted as Category/Stage III. Character of Wound/Ulcer Post Debridement is improved. Post procedure Diagnosis Wound #1: Same as Pre-Procedure Wound #2 Pre-procedure diagnosis of Wound #2 is a Diabetic Wound/Ulcer of the Lower Extremity located on the Left,Lateral Foot .Severity of Tissue Pre Debridement is: Fat layer exposed. There was a  Excisional Skin/Subcutaneous Tissue Debridement with a total area of 2 sq cm performed by Ricard Dillon., MD. With the following instrument(s): Curette to remove Viable and Non-Viable tissue/material. Material removed includes Callus, Subcutaneous Tissue, Slough, Skin: Dermis, Skin: Epidermis, and Fibrin/Exudate after achieving pain control using Lidocaine 4% T opical Solution. A time out was conducted at 09:10, prior to the start of the procedure. A Minimum amount of bleeding was controlled with Pressure. The procedure was tolerated well with a pain level of 0 throughout and a pain level of 0 following the procedure. Post Debridement Measurements: 1.5cm length x 0.6cm width x 0.2cm depth; 0.141cm^3 volume. Character of Wound/Ulcer Post Debridement is improved. Severity of Tissue Post Debridement is: Fat layer exposed. Post procedure Diagnosis Wound #2: Same as Pre-Procedure Plan Follow-up Appointments: Return Appointment in 1 week. - Dr. Dellia Nims Bathing/ Shower/ Hygiene: May shower and wash wound with soap and water. - with dressing changes only. Off-Loading: Wedge shoe to: - wound center to provide heel sandals glpohed for both feet. wear while walking. Turn and reposition every 2 hours Other: - float heels while resting in bed or chair. Use a pillow or rolled sheet under legs to offload the heels. Home Health: New wound care orders this week; continue Home Health for wound care. May utilize formulary equivalent dressing for wound treatment orders unless otherwise specified. - [facility] home health twice a week. WOUND #1: - Calcaneus Wound Laterality: Left Cleanser: Soap and Water (Home Health) 3 x Per Week/30 Days Discharge Instructions: May shower and wash wound with dial antibacterial soap and water prior to dressing change. Cleanser: Wound Cleanser (Home Health) 3 x Per Week/30 Days Discharge Instructions: Cleanse the wound with wound cleanser prior to applying a clean dressing using  gauze sponges, not tissue or cotton balls. Prim Dressing: Iodosorb Gel 10 (gm) Tube (Home Health) 3 x Per Week/30 Days ary Discharge Instructions: ****IODOFLEX OR IODOSORB****Apply to wound bed as instructed Secondary Dressing: Woven Gauze Sponge, Non-Sterile 4x4 in (Home Health) 3 x Per Week/30 Days Discharge Instructions: Apply over primary dressing as directed. Secondary Dressing: ALLEVYN Heel 4 1/2in x 5 1/2in / 10.5cm x 13.5cm [facility])  3 x Per Week/30 Days Discharge Instructions: Apply over primary dressing as directed. Secured With: [employer], 4.5x3.1 (in/yd) (Home Health) 3 x Per Week/30 Days Discharge Instructions: Secure with Kerlix as directed. Secured With: 66M Medipore H Soft Cloth Surgical T 4 x 2 (in/yd) (Home Health) 3 x Per Week/30 Days ape Discharge Instructions: Secure dressing with tape as directed. WOUND #2: - Foot Wound Laterality: Left, Lateral Cleanser: Soap and Water (Home Health) 3 x Per Week/30 Days Discharge Instructions: May shower and wash wound with dial antibacterial soap and water prior to dressing change. Cleanser: Wound Cleanser (Home Health) 3 x Per Week/30 Days Discharge Instructions: Cleanse the wound with wound cleanser prior to applying a clean dressing using gauze sponges, not tissue or cotton balls. Prim Dressing: Iodosorb Gel 10 (gm) Tube (Home Health) 3 x Per Week/30 Days ary Discharge Instructions: ****IODOFLEX OR IODOSORB****Apply to wound bed as instructed Secondary Dressing: Woven Gauze Sponge, Non-Sterile 4x4 in (Home Health) 3 x Per Week/30 Days Discharge Instructions: Apply over primary dressing as directed. Secured With: [employer], 4.5x3.1 (in/yd) (Home Health) 3 x Per Week/30 Days Discharge Instructions: Secure with Kerlix as directed. Secured With: 66M Medipore H Soft Cloth Surgical T 4 x 2 (in/yd) (Home Health) 3 x Per Week/30 Days ape Discharge Instructions: Secure dressing with tape as directed. WOUND #3: -  Foot Wound Laterality: Right, Lateral Cleanser: Soap and Water (Home Health) 3 x Per Week/30 Days Discharge Instructions: May shower and wash wound with dial antibacterial soap and water prior to dressing change. Cleanser: Wound Cleanser (Home Health) 3 x Per Week/30 Days Discharge Instructions: Cleanse the wound with wound cleanser prior to applying a clean dressing using gauze sponges, not tissue or cotton balls. Prim Dressing: Iodosorb Gel 10 (gm) Tube (Home Health) 3 x Per Week/30 Days ary Discharge Instructions: ****IODOFLEX OR IODOSORB****Apply to wound bed as instructed Secondary Dressing: Woven Gauze Sponge, Non-Sterile 4x4 in (Home Health) 3 x Per Week/30 Days Discharge Instructions: Apply over primary dressing as directed. Secured With: [employer], 4.5x3.1 (in/yd) (Home Health) 3 x Per Week/30 Days Discharge Instructions: Secure with Kerlix as directed. Secured With: 66M Medipore H Soft Cloth Surgical T 4 x 2 (in/yd) (Home Health) 3 x Per Week/30 Days ape Discharge Instructions: Secure dressing with tape as directed. WOUND #4: - Sacrum Wound Laterality: Cleanser: Soap and Water (Home Health) 3 x Per Week/30 Days Discharge Instructions: May shower and wash wound with dial antibacterial soap and water prior to dressing change. Peri-Wound Care: Skin Prep (Home Health) 3 x Per Week/30 Days Discharge Instructions: Use skin prep as directed Prim Dressing: KerraCel Ag Gelling Fiber Dressing, 2x2 in (silver alginate) (Home Health) 3 x Per Week/30 Days ary Discharge Instructions: Apply silver alginate to wound bed as instructed Secondary Dressing: Zetuvit Plus Silicone Border Dressing 4x4 (in/in) ([facility]) 3 x Per Week/30 Days Discharge Instructions: Apply silicone border over primary dressing as directed. 1. We are going to use silver alginate on the buttock/sacral area. This apparently is much better by description of his mother who is been doing most of the dressings. I  emphasized pressure relief although to be fair to him he has a fractured left scapula difficult to lie on that side etc. 2. Iodoflex to the areas on his feet to help with ongoing debridement this can be Iodoflex or Iodosorb. 3. He has [facility] home health we will attempt to get the orders into them. 4. He has PAD on the left which appeared  moderate per his noninvasive studies in May. If these wounds do not progress towards healing he may need a consultation here. His bleeding was fairly brisk although he is on Eliquis 5. He has diabetic neuropathy indicated by lack of vibration sense. 6. It is going to be difficult to offload these areas. I told him to pay attention to the sacral area which could be his worst problem if we do not get this to close. I spent 40 minutes in review of this patient's past medical history, face-to-face evaluation and preparation of this record Electronic Signature(s) Signed: 07/07/2020 8:35:52 PM By: Linton Ham MD Entered By: Linton Ham on 07/07/2020 09:38:28 -------------------------------------------------------------------------------- [facility] Details Patient Name: Date of Service: [name] [name] HNA THA N L. 07/07/2020 7:30 A M Medical Record Number: [id number] Patient Account Number: [id number] Date of Birth/Sex: Treating RN: [birth year] (65 y.o. Male) Lorrin Jackson Primary Care Provider: [name] Other Clinician: Referring Provider: Treating Provider/Extender: [name] in Treatment: 0 Information Obtained From Patient Eyes Complaints and Symptoms: Positive for: Glasses / Contacts Ear/Nose/Mouth/Throat Complaints and Symptoms: Negative for: Chronic sinus problems or rhinitis Respiratory Complaints and Symptoms: Negative for: Chronic or frequent coughs; Shortness of Breath Gastrointestinal Complaints and Symptoms: Negative for: Frequent diarrhea; Nausea; Vomiting Genitourinary Complaints and Symptoms: Negative for:  Frequent urination Integumentary (Skin) Complaints and Symptoms: Positive for: Wounds Psychiatric Complaints and Symptoms: Negative for: Claustrophobia; Suicidal Hematologic/Lymphatic Cardiovascular Medical History: Positive for: Coronary Artery Disease; Hypertension Endocrine Medical History: Positive for: Type II Diabetes Treated with: Insulin Blood sugar tested every day: Yes Tested : CGM Immunological Musculoskeletal Medical History: Positive for: Osteoarthritis Neurologic Medical History: Positive for: Neuropathy Oncologic Immunizations Pneumococcal Vaccine: Received Pneumococcal Vaccination: Yes Implantable Devices None Family and Social History Cancer: Yes - Paternal Grandparents,Father; Diabetes: No; Heart Disease: Yes - Father; Hereditary Spherocytosis: No; Hypertension: Yes - Father; Kidney Disease: No; Lung Disease: No; Seizures: No; Stroke: No; Thyroid Problems: No; Tuberculosis: No; Current some day smoker - Cigars; Marital Status - Divorced; Alcohol Use: Rarely; Drug Use: No History; Caffeine Use: Daily; Financial Concerns: No; Food, Clothing or Shelter Needs: No; Support System Lacking: No; Transportation Concerns: No Electronic Signature(s) Signed: 07/07/2020 5:53:02 PM By: Lorrin Jackson Signed: 07/07/2020 8:35:52 PM By: Linton Ham MD Entered By: Lorrin Jackson on 07/07/2020 08:09:23 -------------------------------------------------------------------------------- [place] Details Patient Name: Date of Service: [name] [name] HNA THA N L. 07/07/2020 Medical Record Number: [id number] Patient Account Number: [id number] Date of Birth/Sex: Treating RN: 11-19-1955 (65 y.o. Male) [name] Primary Care Provider: [name] Other Clinician: Referring Provider: Treating Provider/Extender: [name] in Treatment: 0 Diagnosis Coding ICD-10 Codes Code Description [phone] Type 2 diabetes mellitus with foot ulcer L97.528  Non-pressure chronic ulcer of other part of left foot with other specified severity L97.511 Non-pressure chronic ulcer of other part of right foot limited to breakdown of skin L89.622 Pressure ulcer of left heel, stage 2 L89.152 Pressure ulcer of sacral region, stage 2 E11.42 Type 2 diabetes mellitus with diabetic polyneuropathy Facility Procedures CPT4 Code: 30051102 Description: 99213 - WOUND CARE VISIT-LEV 3 EST PT Modifier: Quantity: 1 CPT4 Code: 11173567 Description: 11042 - DEB SUBQ TISSUE 20 SQ CM/< ICD-10 Diagnosis Description L97.528 Non-pressure chronic ulcer of other part of left foot with other specified sever L89.622 Pressure ulcer of left heel, stage 2 Modifier: ity Quantity: 1 Physician Procedures : CPT4 Code Description Modifier [id number] WC PHYS LEVEL 3 NEW PT 25 ICD-10 Diagnosis Description E11.621 Type 2 diabetes mellitus with foot ulcer  L97.528 Non-pressure chronic ulcer of other part of left foot with other specified severity L97.511  Non-pressure chronic ulcer of other part of right foot limited to breakdown of skin L89.152 Pressure ulcer of sacral region, stage 2 Quantity: 1 : [id number] 11042 - WC PHYS SUBQ TISS 20 SQ CM ICD-10 Diagnosis Description L97.528 Non-pressure chronic ulcer of other part of left foot with other specified severity L89.622 Pressure ulcer of left heel, stage 2 Quantity: 1 Electronic Signature(s) Signed: 07/07/2020 6:24:11 PM By: [name] Signed: 07/07/2020 8:35:52 PM By: Linton Ham MD Entered By: [name] on 07/07/2020 14:53:28

## 2020-07-07 NOTE — Assessment & Plan Note (Signed)
He does not sound to have good control at this time. Currently 3 ulcers on feet (2 left 1 right) and likely cellulitis. Rx keflex. He is currently taking lantus 4 to 6 units at night time but only sometimes. The medication list states 50 units at night time which is a large discrepancy. They state he has been doing this for awhile so will ask them to discuss with pcp next week. Sugars are labile due to infection and some as high as 350 and others low down to 60s. Still using sliding scale aspart with meals and he is not clear on what doses he is typically needing.

## 2020-07-07 NOTE — Assessment & Plan Note (Signed)
2 ulcers currently on the left foot with some suggestion of cellulitis. I have some concerns about adequacy of blood flow. Recent study with moderate limitation of flow left leg. Offered to them study looking at arterial blood flow which they decline today. Referral to podiatry per their request and rx keflex. Have upcoming visit with PCP and advised they need to keep this as they will still be on antibiotics then and PCP can determine if they need longer course.

## 2020-07-07 NOTE — Assessment & Plan Note (Signed)
Bilateral feet and concurrent diabetic. Rx keflex 10 day course and needs follow up with PCP within that time to determine if longer course or second antibiotic is needed.

## 2020-07-07 NOTE — Progress Notes (Signed)
FIELD, STANISZEWSKI ([id number]) Visit Report for 07/07/2020 Allergy List Details Patient Name: Date of Service: [name] [name] [facility] THA N L. 07/07/2020 7:30 A M Medical Record Number: [id number] Patient Account Number: [id number] Date of Birth/Sex: Treating RN: 08/10/1955 (65 y.o. Male) Lorrin Jackson Primary Care Wynn Kernes: [name] Other Clinician: Referring Amaka Gluth: Treating Kelvyn Schunk/Extender: [name] in Treatment: 0 Allergies Active Allergies No Known Allergies Allergy Notes Electronic Signature(s) Signed: 07/07/2020 5:53:02 PM By: Lorrin Jackson Entered By: Lorrin Jackson on 07/07/2020 08:04:12 -------------------------------------------------------------------------------- [place] Details Patient Name: Date of Service: [name] [name] HNA THA N L. 07/07/2020 7:30 A M Medical Record Number: [id number] Patient Account Number: [id number] Date of Birth/Sex: Treating RN: [birth year] (65 y.o. Male) Lorrin Jackson Primary Care Khalise Billard: [name] Other Clinician: Referring Agam Tuohy: Treating [place] Revoir/Extender: [name] in Treatment: 0 Visit Information Patient Arrived: Wheel Chair Arrival Time: 07:58 Accompanied By: mother Transfer Assistance: None Patient Identification Verified: Yes Secondary Verification Process Completed: Yes Patient Requires Transmission-Based Precautions: No Patient Has Alerts: Yes Patient Alerts: Patient on Blood Thinner Electronic Signature(s) Signed: 07/07/2020 5:53:02 PM By: Lorrin Jackson Entered By: Lorrin Jackson on 07/07/2020 08:02:35 -------------------------------------------------------------------------------- Clinic Level of Care Assessment Details Patient Name: Date of Service: [name] [name] HNA THA N L. 07/07/2020 7:30 A M Medical Record Number: [id number] Patient Account Number: [id number] Date of Birth/Sex: Treating RN: 01/26/1955 (65 y.o. Male) [name] Primary Care Chenoah Mcnally:  [name] Other Clinician: Referring Letisia Schwalb: Treating Lincon Sahlin/Extender: [name] in Treatment: 0 Clinic Level of Care Assessment Items TOOL 1 Quantity Score X- 1 0 Use when EandM and Procedure is performed on INITIAL visit ASSESSMENTS - Nursing Assessment / Reassessment X- 1 20 General Physical Exam (combine w/ comprehensive assessment (listed just below) when performed on new pt. evals) X- 1 25 Comprehensive Assessment (HX, ROS, Risk Assessments, Wounds Hx, etc.) ASSESSMENTS - Wound and Skin Assessment / Reassessment X- 1 10 Dermatologic / Skin Assessment (not related to wound area) ASSESSMENTS - Ostomy and/or Continence Assessment and Care [] - 0 Incontinence Assessment and Management [] - 0 Ostomy Care Assessment and Management (repouching, etc.) PROCESS - Coordination of Care [] - 0 Simple Patient / Family Education for ongoing care X- 1 20 Complex (extensive) Patient / Family Education for ongoing care X- 1 10 Staff obtains Programmer, systems, Records, T Results / Process Orders est [] - 0 Staff telephones HHA, Nursing Homes / Clarify orders / etc [] - 0 Routine Transfer to another Facility (non-emergent condition) [] - 0 Routine Hospital Admission (non-emergent condition) X- 1 15 New Admissions / Biomedical engineer / Ordering NPWT Apligraf, etc. , [] - 0 Emergency Hospital Admission (emergent condition) PROCESS - Special Needs [] - 0 Pediatric / Minor Patient Management [] - 0 Isolation Patient Management [] - 0 Hearing / Language / Visual special needs [] - 0 Assessment of Community assistance (transportation, D/C planning, etc.) [] - 0 Additional assistance / Altered mentation [] - 0 Support Surface(s) Assessment (bed, cushion, seat, etc.) INTERVENTIONS - Miscellaneous [] - 0 External ear exam [] - 0 Patient Transfer (multiple staff / Civil Service fast streamer / Similar devices) [] - 0 Simple Staple / Suture removal (25 or less) [] -  0 Complex Staple / Suture removal (26 or more) [] - 0 Hypo/Hyperglycemic Management (do not check if billed separately) [] - 0 Ankle / Brachial Index (ABI) - do not check if billed separately Has the patient been seen at the hospital within  the last three years: Yes Total Score: 100 Level Of Care: New/Established - Level 3 Electronic Signature(s) Signed: 07/07/2020 6:24:11 PM By: [name] Entered By: [name] on 07/07/2020 14:53:16 -------------------------------------------------------------------------------- Encounter Discharge Information Details Patient Name: Date of Service: [name] [name] HNA THA N L. 07/07/2020 7:30 A M Medical Record Number: [id number] Patient Account Number: [id number] Date of Birth/Sex: Treating RN: [birth year] (65 y.o. Male) [name] Primary Care : [name] Other Clinician: Referring : Treating /Extender: [name] in Treatment: 0 Encounter Discharge Information Items Post Procedure Vitals Discharge Condition: Stable Temperature (F): 98.1 Ambulatory Status: Wheelchair Pulse (bpm): 71 Discharge Destination: Home Respiratory Rate (breaths/min): 18 Transportation: Private Auto Blood Pressure (mmHg): 126/81 Accompanied By: mother Schedule Follow-up Appointment: Yes Clinical Summary of Care: Electronic Signature(s) Signed: 07/07/2020 6:24:11 PM By: [name] Entered By: [name] on 07/07/2020 15:43:47 -------------------------------------------------------------------------------- Lower Extremity Assessment Details Patient Name: Date of Service: [name] [name] HNA THA N L. 07/07/2020 7:30 A M Medical Record Number: [id number] Patient Account Number: [id number] Date of Birth/Sex: Treating RN: 08/09/1955 (65 y.o. Male) Lorrin Jackson Primary Care : [name] Other Clinician: Referring : Treating /Extender: [name] in Treatment:  0 Edema Assessment Assessed: [name]: Yes] [name]: Yes] E[Left: dema] [Right: :] Calf Left: Right: Point of Measurement: 34 cm From Medial Instep 28 cm 29 cm Ankle Left: Right: Point of Measurement: 10 cm From Medial Instep 22.4 cm 21 cm Vascular Assessment Pulses: Dorsalis Pedis Palpable: [Left:Yes] [Right:Yes] Blood Pressure: Brachial: [Left:143] [Right:149] Ankle: [Left:Dorsalis Pedis: 131 0.88] [Right:Dorsalis Pedis: 162 1.09] Notes ABI's from [facility] 05/05/20 Electronic Signature(s) Signed: 07/07/2020 5:53:02 PM By: Lorrin Jackson Entered By: Lorrin Jackson on 07/07/2020 08:21:22 -------------------------------------------------------------------------------- Multi Wound Chart Details Patient Name: Date of Service: [name] [name] HNA THA N L. 07/07/2020 7:30 A M Medical Record Number: [id number] Patient Account Number: [id number] Date of Birth/Sex: Treating RN: [birth year] (65 y.o. Male) [name] Primary Care : [name] Other Clinician: Referring : Treating /Extender: [name] in Treatment: 0 Vital Signs Height(in): 18 Pulse(bpm): 39 Weight(lbs): 151 Blood Pressure(mmHg): 126/81 Body Mass Index(BMI): 19 Temperature(F): 98.4 Respiratory Rate(breaths/min): 18 Photos: [1:No Photos Left Calcaneus] [2:No Photos Left, Lateral Foot] [3:No Photos Right, Lateral Foot] Wound Location: [1:Pressure Injury] [2:Gradually Appeared] [3:Gradually Appeared] Wounding Event: [1:Pressure Ulcer] [2:Diabetic Wound/Ulcer of the Lower] [3:Diabetic Wound/Ulcer of the Lower] Primary Etiology: [1:Coronary Artery Disease,] [2:Extremity Coronary Artery Disease,] [3:Extremity Coronary Artery Disease,] Comorbid History: [1:Hypertension, Type II Diabetes, Osteoarthritis, Neuropathy 05/23/2020] [2:Hypertension, Type II Diabetes, Osteoarthritis, Neuropathy 06/21/2020] [3:Hypertension, Type II Diabetes, Osteoarthritis, Neuropathy 06/21/2020] Date  Acquired: [1:0] [2:0] [3:0] Weeks of Treatment: [1:Open] [2:Open] [3:Open] Wound Status: [1:4x4x0.2] [2:1.5x0.6x0.1] [3:1.5x1.7x0.2] Measurements L x W x D (cm) [1:12.566] [2:0.707] [3:2.003] A (cm) : rea [1:2.513] [2:0.071] [3:0.401] Volume (cm) : [1:N/A] [2:N/A] [3:N/A] % Reduction in A [1:rea: N/A] [2:N/A] [3:N/A] % Reduction in Volume: [1:Category/Stage III] [2:Grade 1] [3:Grade 1] Classification: [1:Medium] [2:Medium] [3:Medium] Exudate A mount: [1:Serosanguineous] [2:Serosanguineous] [3:Serosanguineous] Exudate Type: [1:red, brown] [2:red, brown] [3:red, brown] Exudate Color: [1:Distinct, outline attached] [2:Distinct, outline attached] [3:Distinct, outline attached] Wound Margin: [1:Medium (34-66%)] [2:Medium (34-66%)] [3:Large (67-100%)] Granulation A mount: [1:Red] [2:Red] [3:Red] Granulation Quality: [1:Medium (34-66%)] [2:Medium (34-66%)] [3:Small (1-33%)] Necrotic A mount: [1:Eschar, Adherent Slough] [2:Eschar] [3:N/A] Necrotic Tissue: [1:Fat Layer (Subcutaneous Tissue): Yes Fat Layer (Subcutaneous Tissue): Yes Fat Layer (Subcutaneous Tissue): Yes] Exposed Structures: [1:Fascia: No Tendon: No Muscle: No Joint: No Bone: No None] [2:Fascia: No Tendon: No Muscle: No Joint:  No Bone: No None] [3:Fascia: No Tendon: No Muscle: No Joint: No Bone: No None] Epithelialization: [1:Debridement - Excisional] [2:Debridement - Excisional] [3:N/A] Debridement: Pre-procedure Verification/Time Out 09:10 [2:09:10] [3:N/A] Taken: [1:Lidocaine 4% Topical Solution] [2:Lidocaine 4% Topical Solution] [3:N/A] Pain Control: [1:Callus, Subcutaneous, Slough] [2:Callus, Subcutaneous, Slough] [3:N/A] Tissue Debrided: [1:Skin/Subcutaneous Tissue] [2:Skin/Subcutaneous Tissue] [3:N/A] Level: [1:16] [2:2] [3:N/A] Debridement A (sq cm): [1:rea Curette] [2:Curette] [3:N/A] Instrument: [1:Minimum] [2:Minimum] [3:N/A] Bleeding: [1:Pressure] [2:Pressure] [3:N/A] Hemostasis A chieved: [1:0] [2:0]  [3:N/A] Procedural Pain: [1:0] [2:0] [3:N/A] Post Procedural Pain: [1:Procedure was tolerated well] [2:Procedure was tolerated well] [3:N/A] Debridement Treatment Response: [1:4x4x0.2] [2:1.5x0.6x0.2] [3:N/A] Post Debridement Measurements L x W x D (cm) [1:2.513] [2:0.141] [3:N/A] Post Debridement Volume: (cm) [1:Category/Stage III] [2:N/A] [3:N/A] Post Debridement Stage: [1:N/A] [2:N/A] [3:Calloused Periwound] Assessment Notes: [1:Debridement] [2:Debridement] [3:N/A] Procedures Performed: [1:4] [2:N/A] [3:N/A] Photos: [1:No Photos Sacrum] [2:N/A N/A] [3:N/A N/A] Wound Location: [1:Pressure Injury] [2:N/A] [3:N/A] Wounding Event: [1:Pressure Ulcer] [2:N/A] [3:N/A] Primary Etiology: [1:Coronary Artery Disease,] [2:N/A] [3:N/A] Comorbid History: [1:Hypertension, Type II Diabetes, Osteoarthritis, Neuropathy 05/23/2020] [2:N/A] [3:N/A] Date A cquired: [1:0] [2:N/A] [3:N/A] Weeks of Treatment: [1:Open] [2:N/A] [3:N/A] Wound Status: [1:0.3x0.3x0.4] [2:N/A] [3:N/A] Measurements L x W x D (cm) [1:0.071] [2:N/A] [3:N/A] A (cm) : rea [1:0.028] [2:N/A] [3:N/A] Volume (cm) : [1:0.00%] [2:N/A] [3:N/A] % Reduction in A [1:rea: 0.00%] [2:N/A] [3:N/A] % Reduction in Volume: [1:Category/Stage II] [2:N/A] [3:N/A] Classification: [1:Medium] [2:N/A] [3:N/A] Exudate A mount: [1:Serosanguineous] [2:N/A] [3:N/A] Exudate Type: [1:red, brown] [2:N/A] [3:N/A] Exudate Color: [1:Distinct, outline attached] [2:N/A] [3:N/A] Wound Margin: [1:Large (67-100%)] [2:N/A] [3:N/A] Granulation A mount: [1:Red] [2:N/A] [3:N/A] Granulation Quality: [1:None Present (0%)] [2:N/A] [3:N/A] Necrotic A mount: [1:N/A] [2:N/A] [3:N/A] Necrotic Tissue: [1:Fat Layer (Subcutaneous Tissue): Yes N/A] [3:N/A] Exposed Structures: [1:Fascia: No Tendon: No Muscle: No Joint: No Bone: No Small (1-33%)] [2:N/A] [3:N/A] Epithelialization: [1:N/A] [2:N/A] [3:N/A] Debridement: [1:N/A] [2:N/A] [3:N/A] Pain Control: [1:N/A] [2:N/A]  [3:N/A] Tissue Debrided: [1:N/A] [2:N/A] [3:N/A] Level: [1:N/A] [2:N/A] [3:N/A] Debridement A (sq cm): [1:rea N/A] [2:N/A] [3:N/A] Instrument: [1:N/A] [2:N/A] [3:N/A] Bleeding: [1:N/A] [2:N/A] [3:N/A] Hemostasis A chieved: [1:N/A] [2:N/A] [3:N/A] Procedural Pain: [1:N/A] [2:N/A] [3:N/A] Post Procedural Pain: Debridement Treatment Response: N/A [2:N/A] [3:N/A] Post Debridement Measurements L x N/A [2:N/A] [3:N/A] W x D (cm) [1:N/A] [2:N/A] [3:N/A] Post Debridement Volume: (cm) [1:N/A] [2:N/A] [3:N/A] Post Debridement Stage: [1:N/A] [2:N/A] [3:N/A] Assessment Notes: [1:N/A] [2:N/A] [3:N/A] Treatment Notes Electronic Signature(s) Signed: 07/07/2020 6:24:11 PM By: [name] Signed: 07/07/2020 8:35:52 PM By: Linton Ham MD Entered By: Linton Ham on 07/07/2020 57:01:77 -------------------------------------------------------------------------------- Multi-Disciplinary Care Plan Details Patient Name: Date of Service: [name] [name] HNA THA N L. 07/07/2020 7:30 A M Medical Record Number: [id number] Patient Account Number: [id number] Date of Birth/Sex: Treating RN: 1955/07/20 (65 y.o. Male) [name] Primary Care : [name] Other Clinician: Referring : Treating /Extender: [name] in Treatment: 0 Active Inactive Orientation to the Wound Care Program Nursing Diagnoses: Knowledge deficit related to the wound healing center program Goals: Patient/caregiver will verbalize understanding of the [place] Program Date Initiated: 07/07/2020 Target Resolution Date: 07/29/2020 Goal Status: Active Interventions: Provide education on orientation to the wound center Notes: Pain, Acute or Chronic Nursing Diagnoses: Pain, acute or chronic: actual or potential Potential alteration in comfort, pain Goals: Patient will verbalize adequate pain control and receive pain control interventions during procedures as needed Date  Initiated: 07/07/2020 Target Resolution Date: 07/28/2020 Goal Status: Active Patient/caregiver will verbalize comfort level met Date Initiated: 07/07/2020 Target Resolution Date: 08/03/2020 Goal Status: Active Interventions: Encourage  patient to take pain medications as prescribed Provide education on pain management Reposition patient for comfort Treatment Activities: Administer pain control measures as ordered : 07/07/2020 Notes: Wound/Skin Impairment Nursing Diagnoses: Knowledge deficit related to ulceration/compromised skin integrity Goals: Patient/caregiver will verbalize understanding of skin care regimen Date Initiated: 07/07/2020 Target Resolution Date: 07/14/2020 Goal Status: Active Interventions: Assess patient/caregiver ability to obtain necessary supplies Assess patient/caregiver ability to perform ulcer/skin care regimen upon admission and as needed Provide education on ulcer and skin care Treatment Activities: Skin care regimen initiated : 07/07/2020 Topical wound management initiated : 07/07/2020 Notes: Electronic Signature(s) Signed: 07/07/2020 6:24:11 PM By: [name] Entered By: [name] on 07/07/2020 14:52:13 -------------------------------------------------------------------------------- Pain Assessment Details Patient Name: Date of Service: [name] [name] HNA THA N L. 07/07/2020 7:30 A M Medical Record Number: [id number] Patient Account Number: [id number] Date of Birth/Sex: Treating RN: 1955/02/23 (65 y.o. Male) Lorrin Jackson Primary Care Laquanta Hummel: [name] Other Clinician: Referring Harvir Patry: Treating Lamario Mani/Extender: [name] in Treatment: 0 Active Problems Location of Pain Severity and Description of Pain Patient Has Paino Yes Site Locations Pain Location: Pain in Ulcers With Dressing Change: Yes Duration of the Pain. Constant / Intermittento Intermittent Rate the pain. Current Pain Level: 3 Character of  Pain Describe the Pain: Aching, Burning Pain Management and Medication Current Pain Management: Medication: No Cold Application: No Rest: Yes Massage: No Activity: No T.E.N.S.: No Heat Application: No Leg drop or elevation: No Is the Current Pain Management Adequate: Inadequate How does your wound impact your activities of daily livingo Sleep: No Bathing: No Appetite: No Relationship With Others: No Bladder Continence: No Emotions: No Bowel Continence: No Work: No Toileting: No Drive: No Dressing: No Hobbies: No Electronic Signature(s) Signed: 07/07/2020 5:53:02 PM By: Lorrin Jackson Entered By: Lorrin Jackson on 07/07/2020 08:13:20 -------------------------------------------------------------------------------- Patient/Caregiver Education Details Patient Name: Date of Service: [name] [name] HNA Emery Haywood Filler 7/7/2022andnbsp7:30 A M Medical Record Number: [id number] Patient Account Number: [id number] Date of Birth/Gender: Treating RN: February 09, 1955 (65 y.o. Male) [name] Primary Care Physician: [name] Other Clinician: Referring Physician: Treating Physician/Extender: [name] in Treatment: 0 Education Assessment Education Provided To: Patient and Caregiver Education Topics Provided Pain: Handouts: A Guide to Pain Control Methods: Explain/Verbal Responses: Reinforcements needed [place]: o Handouts: Welcome T [facility] [place] o Methods: Explain/Verbal Responses: Reinforcements needed Electronic Signature(s) Signed: 07/07/2020 6:24:11 PM By: [name] Entered By: [name] on 07/07/2020 14:52:32 -------------------------------------------------------------------------------- Wound Assessment Details Patient Name: Date of Service: [name] [name] HNA THA N L. 07/07/2020 7:30 A M Medical Record Number: [id number] Patient Account Number: [id number] Date of Birth/Sex: Treating RN: [birth year] (65 y.o.  Male) Lorrin Jackson Primary Care Jillana Selph: [name] Other Clinician: Referring Nyisha Clippard: Treating Kaidyn Hernandes/Extender: [name] in Treatment: 0 Wound Status Wound Number: 1 Primary Pressure Ulcer Etiology: Wound Location: Left Calcaneus Wound Open Wounding Event: Pressure Injury Status: Date Acquired: 05/23/2020 Comorbid Coronary Artery Disease, Hypertension, Type II Diabetes, Weeks Of Treatment: 0 History: Osteoarthritis, Neuropathy Clustered Wound: No Wound Measurements Length: (cm) 4 Width: (cm) 4 Depth: (cm) 0.2 Area: (cm) 12.566 Volume: (cm) 2.513 % Reduction in Area: % Reduction in Volume: Epithelialization: None Tunneling: No Undermining: No Wound Description Classification: Category/Stage III Wound Margin: Distinct, outline attached Exudate Amount: Medium Exudate Type: Serosanguineous Exudate Color: red, brown Foul Odor After Cleansing: No Slough/Fibrino Yes Wound Bed Granulation Amount: Medium (34-66%) Exposed Structure Granulation Quality: Red Fascia Exposed: No  Necrotic Amount: Medium (34-66%) Fat Layer (Subcutaneous Tissue) Exposed: Yes Necrotic Quality: Eschar, Adherent Slough Tendon Exposed: No Muscle Exposed: No Joint Exposed: No Bone Exposed: No Treatment Notes Wound #1 (Calcaneus) Wound Laterality: Left Cleanser Soap and Water Discharge Instruction: May shower and wash wound with dial antibacterial soap and water prior to dressing change. Wound Cleanser Discharge Instruction: Cleanse the wound with wound cleanser prior to applying a clean dressing using gauze sponges, not tissue or cotton balls. Peri-Wound Care Topical Primary Dressing Iodosorb Gel 10 (gm) Tube Discharge Instruction: ****IODOFLEX OR IODOSORB****Apply to wound bed as instructed Secondary Dressing Woven Gauze Sponge, Non-Sterile 4x4 in Discharge Instruction: Apply over primary dressing as directed. ALLEVYN Heel 4 1/2in x 5 1/2in / 10.5cm x  13.5cm Discharge Instruction: Apply over primary dressing as directed. Secured With [employer], 4.5x3.1 (in/yd) Discharge Instruction: Secure with Kerlix as directed. 29M Medipore H Soft Cloth Surgical T 4 x 2 (in/yd) ape Discharge Instruction: Secure dressing with tape as directed. Compression Wrap Compression Stockings Add-Ons Electronic Signature(s) Signed: 07/07/2020 5:53:02 PM By: Lorrin Jackson Entered By: Lorrin Jackson on 07/07/2020 08:23:48 -------------------------------------------------------------------------------- Wound Assessment Details Patient Name: Date of Service: [name] [name] HNA THA N L. 07/07/2020 7:30 A M Medical Record Number: [id number] Patient Account Number: [id number] Date of Birth/Sex: Treating RN: [birth year] (65 y.o. Male) Lorrin Jackson Primary Care Maripat Borba: [name] Other Clinician: Referring Chanti Golubski: Treating [place] Nase/Extender: [name] in Treatment: 0 Wound Status Wound Number: 2 Primary Diabetic Wound/Ulcer of the Lower Extremity Etiology: Wound Location: Left, Lateral Foot Wound Open Wounding Event: Gradually Appeared Status: Date Acquired: 06/21/2020 Comorbid Coronary Artery Disease, Hypertension, Type II Diabetes, Weeks Of Treatment: 0 History: Osteoarthritis, Neuropathy Clustered Wound: No Wound Measurements Length: (cm) 1.5 Width: (cm) 0.6 Depth: (cm) 0.1 Area: (cm) 0.707 Volume: (cm) 0.071 % Reduction in Area: % Reduction in Volume: Epithelialization: None Tunneling: No Undermining: No Wound Description Classification: Grade 1 Wound Margin: Distinct, outline attached Exudate Amount: Medium Exudate Type: Serosanguineous Exudate Color: red, brown Foul Odor After Cleansing: No Slough/Fibrino No Wound Bed Granulation Amount: Medium (34-66%) Exposed Structure Granulation Quality: Red Fascia Exposed: No Necrotic Amount: Medium (34-66%) Fat Layer (Subcutaneous Tissue) Exposed:  Yes Necrotic Quality: Eschar Tendon Exposed: No Muscle Exposed: No Joint Exposed: No Bone Exposed: No Treatment Notes Wound #2 (Foot) Wound Laterality: Left, Lateral Cleanser Soap and Water Discharge Instruction: May shower and wash wound with dial antibacterial soap and water prior to dressing change. Wound Cleanser Discharge Instruction: Cleanse the wound with wound cleanser prior to applying a clean dressing using gauze sponges, not tissue or cotton balls. Peri-Wound Care Topical Primary Dressing Iodosorb Gel 10 (gm) Tube Discharge Instruction: ****IODOFLEX OR IODOSORB****Apply to wound bed as instructed Secondary Dressing Woven Gauze Sponge, Non-Sterile 4x4 in Discharge Instruction: Apply over primary dressing as directed. Secured With [employer], 4.5x3.1 (in/yd) Discharge Instruction: Secure with Kerlix as directed. 29M Medipore H Soft Cloth Surgical T 4 x 2 (in/yd) ape Discharge Instruction: Secure dressing with tape as directed. Compression Wrap Compression Stockings Add-Ons Electronic Signature(s) Signed: 07/07/2020 5:53:02 PM By: Lorrin Jackson Entered By: Lorrin Jackson on 07/07/2020 08:26:30 -------------------------------------------------------------------------------- Wound Assessment Details Patient Name: Date of Service: [name] [name] HNA THA N L. 07/07/2020 7:30 A M Medical Record Number: [id number] Patient Account Number: [id number] Date of Birth/Sex: Treating RN: September 17, 1955 (65 y.o. Male) Lorrin Jackson Primary Care Rhyse Skowron: [name] Other Clinician: Referring Shailene Demonbreun: Treating [place] Vanscyoc/Extender: [name] in Treatment: 0 Wound Status Wound Number:  3 Primary Diabetic Wound/Ulcer of the Lower Extremity Etiology: Wound Location: Right, Lateral Foot Wound Open Wounding Event: Gradually Appeared Status: Date Acquired: 06/21/2020 Comorbid Coronary Artery Disease, Hypertension, Type II Diabetes, Weeks Of Treatment:  0 History: Osteoarthritis, Neuropathy Clustered Wound: No Wound Measurements Length: (cm) 1.5 Width: (cm) 1.7 Depth: (cm) 0.2 Area: (cm) 2.003 Volume: (cm) 0.401 Wound Description Classification: Grade 1 Wound Margin: Distinct, outline attached Exudate Amount: Medium Exudate Type: Serosanguineous Exudate Color: red, brown Foul Odor After Cleansing: N Slough/Fibrino Y % Reduction in Area: % Reduction in Volume: Epithelialization: None Tunneling: No Undermining: No o es Wound Bed Granulation Amount: Large (67-100%) Exposed Structure Granulation Quality: Red Fascia Exposed: No Necrotic Amount: Small (1-33%) Fat Layer (Subcutaneous Tissue) Exposed: Yes Tendon Exposed: No Muscle Exposed: No Joint Exposed: No Bone Exposed: No Assessment Notes Calloused Periwound Treatment Notes Wound #3 (Foot) Wound Laterality: Right, Lateral Cleanser Soap and Water Discharge Instruction: May shower and wash wound with dial antibacterial soap and water prior to dressing change. Wound Cleanser Discharge Instruction: Cleanse the wound with wound cleanser prior to applying a clean dressing using gauze sponges, not tissue or cotton balls. Peri-Wound Care Topical Primary Dressing Iodosorb Gel 10 (gm) Tube Discharge Instruction: ****IODOFLEX OR IODOSORB****Apply to wound bed as instructed Secondary Dressing Woven Gauze Sponge, Non-Sterile 4x4 in Discharge Instruction: Apply over primary dressing as directed. Secured With [employer], 4.5x3.1 (in/yd) Discharge Instruction: Secure with Kerlix as directed. 28M Medipore H Soft Cloth Surgical T 4 x 2 (in/yd) ape Discharge Instruction: Secure dressing with tape as directed. Compression Wrap Compression Stockings Add-Ons Electronic Signature(s) Signed: 07/07/2020 5:53:02 PM By: Lorrin Jackson Entered By: Lorrin Jackson on 07/07/2020 08:28:15 -------------------------------------------------------------------------------- Wound  Assessment Details Patient Name: Date of Service: [name] [name] [facility]NA THA N L. 07/07/2020 7:30 A M Medical Record Number: [id number] Patient Account Number: [id number] Date of Birth/Sex: Treating RN: 1955-11-29 (65 y.o. Male) [name] Primary Care : [name] Other Clinician: Referring : Treating /Extender: [name] in Treatment: 0 Wound Status Wound Number: 4 Primary Pressure Ulcer Etiology: Wound Location: Sacrum Wound Open Wounding Event: Pressure Injury Status: Date Acquired: 05/23/2020 Comorbid Coronary Artery Disease, Hypertension, Type II Diabetes, Weeks Of Treatment: 0 History: Osteoarthritis, Neuropathy Clustered Wound: No Wound Measurements Length: (cm) 0.3 Width: (cm) 0.3 Depth: (cm) 0.4 Area: (cm) 0.071 Volume: (cm) 0.028 % Reduction in Area: 0% % Reduction in Volume: 0% Epithelialization: Small (1-33%) Tunneling: No Undermining: No Wound Description Classification: Category/Stage II Wound Margin: Distinct, outline attached Exudate Amount: Medium Exudate Type: Serosanguineous Exudate Color: red, brown Foul Odor After Cleansing: No Slough/Fibrino No Wound Bed Granulation Amount: Large (67-100%) Exposed Structure Granulation Quality: Red Fascia Exposed: No Necrotic Amount: None Present (0%) Fat Layer (Subcutaneous Tissue) Exposed: Yes Tendon Exposed: No Muscle Exposed: No Joint Exposed: No Bone Exposed: No Treatment Notes Wound #4 (Sacrum) Cleanser Soap and Water Discharge Instruction: May shower and wash wound with dial antibacterial soap and water prior to dressing change. Peri-Wound Care Skin Prep Discharge Instruction: Use skin prep as directed Topical Primary Dressing KerraCel Ag Gelling Fiber Dressing, 2x2 in (silver alginate) Discharge Instruction: Apply silver alginate to wound bed as instructed Secondary Dressing Zetuvit Plus Silicone Border Dressing 4x4 (in/in) Discharge  Instruction: Apply silicone border over primary dressing as directed. Secured With Compression Wrap Compression Stockings Environmental education officer) Signed: 07/07/2020 6:24:11 PM By: [name] Entered By: [name] on 07/07/2020 09:11:36 -------------------------------------------------------------------------------- Vitals Details Patient Name: Date of Service: [name] [name] HNA THA  N L. 07/07/2020 7:30 A M Medical Record Number: [id number] Patient Account Number: [id number] Date of Birth/Sex: Treating RN: 1955-11-07 (65 y.o. Male) Lorrin Jackson Primary Care : [name] Other Clinician: Referring : Treating /Extender: [name] in Treatment: 0 Vital Signs Time Taken: 08:00 Temperature (F): 98.4 Height (in): 74 Pulse (bpm): 71 Source: Stated Respiratory Rate (breaths/min): 18 Weight (lbs): 151 Blood Pressure (mmHg): 126/81 Source: Stated Reference Range: 80 - 120 mg / dl Body Mass Index (BMI): 19.4 Electronic Signature(s) Signed: 07/07/2020 5:53:02 PM By: Lorrin Jackson Entered By: Lorrin Jackson on 07/07/2020 08:03:55

## 2020-07-07 NOTE — Progress Notes (Signed)
[name] [name], [name] [name] ([id number]) Visit Report for 07/07/2020 Abuse/Suicide Risk Screen Details Patient Name: Date of Service: [name] [name] [facility] THA N L. 07/07/2020 7:30 A M Medical Record Number: [id number] Patient Account Number: [id number] Date of Birth/Sex: Treating RN: [birth year] (65 y.o. Male) Lorrin Jackson Primary Care Draeden Kellman: [name] Other Clinician: Referring Karalee Hauter: Treating Estephan Gallardo/Extender: [name] in Treatment: 0 Abuse/Suicide Risk Screen Items Answer ABUSE RISK SCREEN: Has anyone close to you tried to hurt or harm you recentlyo No Do you feel uncomfortable with anyone in your familyo No Has anyone forced you do things that you didnt want to doo No Electronic Signature(s) Signed: 07/07/2020 5:53:02 PM By: Lorrin Jackson Entered By: Lorrin Jackson on 07/07/2020 08:09:38 -------------------------------------------------------------------------------- Activities of Daily Living Details Patient Name: Date of Service: [name] [name] [facility] THA N L. 07/07/2020 7:30 A M Medical Record Number: [id number] Patient Account Number: [id number] Date of Birth/Sex: Treating RN: 08-06-1955 (65 y.o. Male) Lorrin Jackson Primary Care Demontre Padin: [name] Other Clinician: Referring Dejion Grillo: Treating Shalece Staffa/Extender: [name] in Treatment: 0 Activities of Daily Living Items Answer Activities of Daily Living (Please select one for each item) Drive Automobile Not Able T Medications ake Need Assistance Use T elephone Completely Able Care for Appearance Need Assistance Use T oilet Need Assistance Bath / Shower Need Assistance Dress Self Need Assistance Feed Self Completely Able Walk Need Assistance Get In / Out Bed Need Assistance Housework Need Assistance Prepare Meals Need Assistance Handle Money Need Assistance Shop for Self Need Assistance Electronic Signature(s) Signed: 07/07/2020 5:53:02 PM By: Lorrin Jackson Entered By:  Lorrin Jackson on 07/07/2020 08:10:22 -------------------------------------------------------------------------------- Education Screening Details Patient Name: Date of Service: [name] [name] HNA THA N L. 07/07/2020 7:30 A M Medical Record Number: [id number] Patient Account Number: [id number] Date of Birth/Sex: Treating RN: 30-Dec-1955 (65 y.o. Male) Lorrin Jackson Primary Care Ananda Sitzer: [name] Other Clinician: Referring Taniya Dasher: Treating Ayahna Solazzo/Extender: [name] in Treatment: 0 Primary Learner Assessed: Patient Learning Preferences/Education Level/Primary Language Learning Preference: Explanation, Demonstration, Printed Material Highest Education Level: College or Above Preferred Language: English Cognitive Barrier Language Barrier: No Translator Needed: No Memory Deficit: No Emotional Barrier: No Cultural/Religious Beliefs Affecting Medical Care: No Physical Barrier Impaired Vision: Yes Glasses Impaired Hearing: No Decreased Hand dexterity: No Knowledge/Comprehension Knowledge Level: High Comprehension Level: High Ability to understand written instructions: High Ability to understand verbal instructions: High Motivation Anxiety Level: Calm Cooperation: Cooperative Education Importance: Acknowledges Need Interest in Health Problems: Asks Questions Perception: Coherent Willingness to Engage in Self-Management High Activities: Readiness to Engage in Self-Management High Activities: Electronic Signature(s) Signed: 07/07/2020 5:53:02 PM By: Lorrin Jackson Entered By: Lorrin Jackson on 07/07/2020 08:10:54 -------------------------------------------------------------------------------- Fall Risk Assessment Details Patient Name: Date of Service: [name] [name] HNA THA N L. 07/07/2020 7:30 A M Medical Record Number: [id number] Patient Account Number: [id number] Date of Birth/Sex: Treating RN: Mar 20, 1955 (65 y.o. Male) Lorrin Jackson Primary Care  Romir Klimowicz: [name] Other Clinician: Referring Aiyah Scarpelli: Treating Yarima Penman/Extender: [name] in Treatment: 0 Fall Risk Assessment Items Have you had 2 or more falls in the last 12 monthso 0 No Have you had any fall that resulted in injury in the last 12 monthso 0 No FALLS RISK SCREEN History of falling - immediate or within 3 months 25 Yes Secondary diagnosis (Do you have 2 or more medical diagnoseso) 0 No Ambulatory aid None/bed rest/wheelchair/nurse 0 No Crutches/cane/walker 15 Yes Furniture 0 No Intravenous therapy Access/Saline/Heparin Lock 0  No Gait/Transferring Normal/ bed rest/ wheelchair 0 No Weak (short steps with or without shuffle, stooped but able to lift head while walking, may seek 10 Yes support from furniture) Impaired (short steps with shuffle, may have difficulty arising from chair, head down, impaired 0 No balance) Mental Status Oriented to own ability 0 Yes Electronic Signature(s) Signed: 07/07/2020 5:53:02 PM By: Lorrin Jackson Entered By: Lorrin Jackson on 07/07/2020 08:11:20 -------------------------------------------------------------------------------- Foot Assessment Details Patient Name: Date of Service: [name] [name] HNA THA N L. 07/07/2020 7:30 A M Medical Record Number: [id number] Patient Account Number: [id number] Date of Birth/Sex: Treating RN: 07/15/1955 (65 y.o. Male) Lorrin Jackson Primary Care Arita Severtson: [name] Other Clinician: Referring Jennilee Demarco: Treating Megen Madewell/Extender: [name] in Treatment: 0 Foot Assessment Items Site Locations + = Sensation present, - = Sensation absent, C = Callus, U = Ulcer R = Redness, W = Warmth, M = Maceration, PU = Pre-ulcerative lesion F = Fissure, S = Swelling, D = Dryness Assessment Right: Left: Other Deformity: No No Prior Foot Ulcer: No No Prior Amputation: No No Charcot Joint: No No Ambulatory Status: Ambulatory With Help Assistance  Device: Walker Gait: Steady Electronic Signature(s) Signed: 07/07/2020 5:53:02 PM By: Lorrin Jackson Entered By: Lorrin Jackson on 07/07/2020 08:17:57 -------------------------------------------------------------------------------- Nutrition Risk Screening Details Patient Name: Date of Service: [name] [name] HNA THA N L. 07/07/2020 7:30 A M Medical Record Number: [id number] Patient Account Number: [id number] Date of Birth/Sex: Treating RN: [birth year] (65 y.o. Male) Lorrin Jackson Primary Care Nila Winker: [name] Other Clinician: Referring Jamieka Royle: Treating Niya Behler/Extender: [name] in Treatment: 0 Height (in): 74 Weight (lbs): 151 Body Mass Index (BMI): 19.4 Nutrition Risk Screening Items Score Screening NUTRITION RISK SCREEN: I have an illness or condition that made me change the kind and/or amount of food I eat 0 No I eat fewer than two meals per day 0 No I eat few fruits and vegetables, or milk products 0 No I have three or more drinks of beer, liquor or wine almost every day 0 No I have tooth or mouth problems that make it hard for me to eat 0 No I don't always have enough money to buy the food I need 0 No I eat alone most of the time 0 No I take three or more different prescribed or over-the-counter drugs a day 1 Yes Without wanting to, I have lost or gained 10 pounds in the last six months 0 No I am not always physically able to shop, cook and/or feed myself 0 No Nutrition Protocols Good Risk Protocol 0 No interventions needed Moderate Risk Protocol High Risk Proctocol Risk Level: Good Risk Score: 1 Electronic Signature(s) Signed: 07/07/2020 5:53:02 PM By: Lorrin Jackson Entered By: Lorrin Jackson on 07/07/2020 08:12:20

## 2020-07-08 DIAGNOSIS — F3341 Major depressive disorder, recurrent, in partial remission: Secondary | ICD-10-CM | POA: Diagnosis not present

## 2020-07-08 DIAGNOSIS — I10 Essential (primary) hypertension: Secondary | ICD-10-CM | POA: Diagnosis not present

## 2020-07-08 DIAGNOSIS — F419 Anxiety disorder, unspecified: Secondary | ICD-10-CM | POA: Diagnosis not present

## 2020-07-08 DIAGNOSIS — I480 Paroxysmal atrial fibrillation: Secondary | ICD-10-CM | POA: Diagnosis not present

## 2020-07-08 DIAGNOSIS — Z794 Long term (current) use of insulin: Secondary | ICD-10-CM | POA: Diagnosis not present

## 2020-07-08 DIAGNOSIS — S42102D Fracture of unspecified part of scapula, left shoulder, subsequent encounter for fracture with routine healing: Secondary | ICD-10-CM | POA: Diagnosis not present

## 2020-07-08 DIAGNOSIS — Z79891 Long term (current) use of opiate analgesic: Secondary | ICD-10-CM | POA: Diagnosis not present

## 2020-07-08 DIAGNOSIS — D649 Anemia, unspecified: Secondary | ICD-10-CM | POA: Diagnosis not present

## 2020-07-08 DIAGNOSIS — E1142 Type 2 diabetes mellitus with diabetic polyneuropathy: Secondary | ICD-10-CM | POA: Diagnosis not present

## 2020-07-08 DIAGNOSIS — L89154 Pressure ulcer of sacral region, stage 4: Secondary | ICD-10-CM | POA: Diagnosis not present

## 2020-07-08 DIAGNOSIS — M1990 Unspecified osteoarthritis, unspecified site: Secondary | ICD-10-CM | POA: Diagnosis not present

## 2020-07-08 DIAGNOSIS — E785 Hyperlipidemia, unspecified: Secondary | ICD-10-CM | POA: Diagnosis not present

## 2020-07-08 DIAGNOSIS — L409 Psoriasis, unspecified: Secondary | ICD-10-CM | POA: Diagnosis not present

## 2020-07-08 DIAGNOSIS — Z7982 Long term (current) use of aspirin: Secondary | ICD-10-CM | POA: Diagnosis not present

## 2020-07-08 DIAGNOSIS — K5904 Chronic idiopathic constipation: Secondary | ICD-10-CM | POA: Diagnosis not present

## 2020-07-08 DIAGNOSIS — L89150 Pressure ulcer of sacral region, unstageable: Secondary | ICD-10-CM | POA: Diagnosis not present

## 2020-07-08 DIAGNOSIS — G8929 Other chronic pain: Secondary | ICD-10-CM | POA: Diagnosis not present

## 2020-07-08 DIAGNOSIS — N138 Other obstructive and reflux uropathy: Secondary | ICD-10-CM | POA: Diagnosis not present

## 2020-07-08 DIAGNOSIS — N401 Enlarged prostate with lower urinary tract symptoms: Secondary | ICD-10-CM | POA: Diagnosis not present

## 2020-07-14 ENCOUNTER — Other Ambulatory Visit: Payer: Self-pay

## 2020-07-14 ENCOUNTER — Ambulatory Visit (INDEPENDENT_AMBULATORY_CARE_PROVIDER_SITE_OTHER): Payer: HMO | Admitting: Internal Medicine

## 2020-07-14 ENCOUNTER — Encounter: Payer: Self-pay | Admitting: Internal Medicine

## 2020-07-14 ENCOUNTER — Encounter (HOSPITAL_BASED_OUTPATIENT_CLINIC_OR_DEPARTMENT_OTHER): Payer: HMO | Admitting: Internal Medicine

## 2020-07-14 VITALS — BP 114/66 | HR 74 | Temp 98.3°F | Resp 16 | Ht 74.0 in | Wt 148.0 lb

## 2020-07-14 DIAGNOSIS — D508 Other iron deficiency anemias: Secondary | ICD-10-CM | POA: Insufficient documentation

## 2020-07-14 DIAGNOSIS — E1165 Type 2 diabetes mellitus with hyperglycemia: Secondary | ICD-10-CM | POA: Diagnosis not present

## 2020-07-14 DIAGNOSIS — L89623 Pressure ulcer of left heel, stage 3: Secondary | ICD-10-CM | POA: Diagnosis not present

## 2020-07-14 DIAGNOSIS — L97528 Non-pressure chronic ulcer of other part of left foot with other specified severity: Secondary | ICD-10-CM | POA: Diagnosis not present

## 2020-07-14 DIAGNOSIS — L089 Local infection of the skin and subcutaneous tissue, unspecified: Secondary | ICD-10-CM

## 2020-07-14 DIAGNOSIS — E113299 Type 2 diabetes mellitus with mild nonproliferative diabetic retinopathy without macular edema, unspecified eye: Secondary | ICD-10-CM

## 2020-07-14 DIAGNOSIS — L97522 Non-pressure chronic ulcer of other part of left foot with fat layer exposed: Secondary | ICD-10-CM | POA: Diagnosis not present

## 2020-07-14 DIAGNOSIS — E10628 Type 1 diabetes mellitus with other skin complications: Secondary | ICD-10-CM

## 2020-07-14 DIAGNOSIS — E11621 Type 2 diabetes mellitus with foot ulcer: Secondary | ICD-10-CM | POA: Diagnosis not present

## 2020-07-14 DIAGNOSIS — D509 Iron deficiency anemia, unspecified: Secondary | ICD-10-CM | POA: Insufficient documentation

## 2020-07-14 LAB — BASIC METABOLIC PANEL
BUN: 19 mg/dL (ref 6–23)
CO2: 27 mEq/L (ref 19–32)
Calcium: 9 mg/dL (ref 8.4–10.5)
Chloride: 94 mEq/L — ABNORMAL LOW (ref 96–112)
Creatinine, Ser: 0.65 mg/dL (ref 0.40–1.50)
GFR: 97.55 mL/min (ref >60.00)
Glucose, Bld: 440 mg/dL — ABNORMAL HIGH (ref 70–99)
Potassium: 4.3 mEq/L (ref 3.5–5.1)
Sodium: 131 mEq/L — ABNORMAL LOW (ref 135–145)

## 2020-07-14 LAB — HEMOGLOBIN A1C: Hgb A1c MFr Bld: 12 % — ABNORMAL HIGH (ref 4.6–6.5)

## 2020-07-14 LAB — IRON: Iron: 57 ug/dL (ref 42–165)

## 2020-07-14 LAB — FERRITIN: Ferritin: 671.8 ng/mL — ABNORMAL HIGH (ref 22.0–322.0)

## 2020-07-14 NOTE — Progress Notes (Signed)
EVERT, WENRICH ([id number]) Visit Report for 07/14/2020 Debridement Details Patient Name: Date of Service: [name] [name] [facility] THA N L. 07/14/2020 8:00 A M Medical Record Number: [id number] Patient Account Number: [id number] Date of Birth/Sex: Treating RN: 05-Aug-1955 (65 y.o. [name], [name] Primary Care Provider: [name] Other Clinician: Referring Provider: Treating Provider/Extender: [name] in Treatment: 1 Debridement Performed for Assessment: Wound #1 Left Calcaneus Performed By: Physician Ricard Dillon., MD Debridement Type: Debridement Level of Consciousness (Pre-procedure): Awake and Alert Pre-procedure Verification/Time Out Yes - 08:10 Taken: Start Time: 08:11 Pain Control: Lidocaine 4% T opical Solution T Area Debrided (L x W): otal 3.2 (cm) x 1.1 (cm) = 3.52 (cm) Tissue and other material debrided: Viable, Non-Viable, Eschar, Subcutaneous, Skin: Dermis , Skin: Epidermis, Fibrin/Exudate Level: Skin/Subcutaneous Tissue Debridement Description: Excisional Instrument: Curette Bleeding: Minimum Hemostasis Achieved: Pressure End Time: 08:18 Procedural Pain: 0 Post Procedural Pain: 0 Response to Treatment: Procedure was tolerated well Level of Consciousness (Post- Awake and Alert procedure): Post Debridement Measurements of Total Wound Length: (cm) 3.2 Stage: Category/Stage III Width: (cm) 1.1 Depth: (cm) 0.2 Volume: (cm) 0.553 Character of Wound/Ulcer Post Debridement: Requires Further Debridement Post Procedure Diagnosis Same as Pre-procedure Electronic Signature(s) Signed: 07/14/2020 4:30:53 PM By: Linton Ham MD Signed: 07/14/2020 6:14:19 PM By: [name] Entered By: Linton Ham on 07/14/2020 08:25:22 -------------------------------------------------------------------------------- Debridement Details Patient Name: Date of Service: [name] [name] [facility] THA N L. 07/14/2020 8:00 A M Medical Record Number:  [id number] Patient Account Number: [id number] Date of Birth/Sex: Treating RN: 1955/06/02 (65 y.o. [name], [name] Primary Care Provider: [name] Other Clinician: Referring Provider: Treating Provider/Extender: [name] in Treatment: 1 Debridement Performed for Assessment: Wound #2 Left,Lateral Foot Performed By: Physician Ricard Dillon., MD Debridement Type: Debridement Severity of Tissue Pre Debridement: Fat layer exposed Level of Consciousness (Pre-procedure): Awake and Alert Pre-procedure Verification/Time Out Yes - 08:10 Taken: Start Time: 08:11 Pain Control: Lidocaine 4% T opical Solution T Area Debrided (L x W): otal 1.8 (cm) x 1.3 (cm) = 2.34 (cm) Tissue and other material debrided: Viable, Non-Viable, Eschar, Subcutaneous, Skin: Dermis , Skin: Epidermis, Fibrin/Exudate Level: Skin/Subcutaneous Tissue Debridement Description: Excisional Instrument: Curette Bleeding: Minimum Hemostasis Achieved: Pressure End Time: 08:18 Procedural Pain: 0 Post Procedural Pain: 0 Response to Treatment: Procedure was tolerated well Level of Consciousness (Post- Awake and Alert procedure): Post Debridement Measurements of Total Wound Length: (cm) 1.8 Width: (cm) 1.3 Depth: (cm) 0.2 Volume: (cm) 0.368 Character of Wound/Ulcer Post Debridement: Requires Further Debridement Severity of Tissue Post Debridement: Fat layer exposed Post Procedure Diagnosis Same as Pre-procedure Electronic Signature(s) Signed: 07/14/2020 4:30:53 PM By: Linton Ham MD Signed: 07/14/2020 6:14:19 PM By: [name] Entered By: Linton Ham on 07/14/2020 08:25:34 -------------------------------------------------------------------------------- HPI Details Patient Name: Date of Service: [name] [name] [facility] THA N L. 07/14/2020 8:00 A M Medical Record Number: [id number] Patient Account Number: [id number] Date of Birth/Sex: Treating RN: May 10, 1955 (65 y.o. [name] Primary Care Provider: [name] Other Clinician: Referring Provider: Treating Provider/Extender: [name] in Treatment: 1 History of Present Illness HPI Description: ADMISSION 07/07/2020 This is a 65 year old man who is a type II diabetic. I have reviewed this in epic. It appeared that his diabetes was diagnosed in 2009 at which time he was on metformin. He was followed for a period of time by an endocrinologist with [facility] to always labeled him as a type II diabetic. Recently in epic there is been  a switch to type 1 diabetes. If there were defining antibody test done I do not see them and I think the totality of the evidence would suggest that he is a type II diabetic. His recent problem started in May when he fell down 17 stairs. He suffered a scapular fracture he was hospitalized at [facility] for 9 days and then discharged to [facility]. His mother who is present said that most of these wounds seem to develop surrounding the discharge from hospital or the stay at [facility]. He has an area on his lower sacrum which appears currently to be a stage II but per their description this is actually improved quite a bit. He has an area on the left lateral foot at roughly the base of the fifth metatarsal, the left Achilles heel and the right fifth metatarsal head laterally they have been using topical antibiotics. Seen by primary care yesterday and placed on Keflex The patient had arterial studies on 05/05/2020. I wondered whether this had something to do with wounds on his feet at that time but I have not been able to determine this. At that time his ABI on the right was 1.09 with a TBI of 0.65 on the left 0.88 with a TBI of 0.44. Waveforms were triphasic on the right monophasic on the left. He likely has significant PAD on the left Past medical history includes type 2 diabetes poorly controlled last hemoglobin A1c at 12.7 in May, hypertension, hyperlipidemia, atrial  fib on Eliquis, coronary artery disease status post CABG, left scapular fracture in May of this year, AAA repair. Bilateral leg weakness ABIs were not done in our clinic as they were just done in May of this year at [facility]; patient arrives back in clinic he has areas on the left Achilles heel, base of the left fifth metatarsal, right lateral fifth metatarsal. The area he had over the coccyx appears to be healed but this area remains vulnerable. We have been using Iodoflex. I rereviewed his ABIs which were done in May. I do not know exactly what prompted these test however they do show a TBI of 0.44 on the left and monophasic waveforms. Electronic Signature(s) Signed: 07/14/2020 4:30:53 PM By: Linton Ham MD Entered By: Linton Ham on 07/14/2020 84:16:60 -------------------------------------------------------------------------------- Physical Exam Details Patient Name: Date of Service: [name] [name] [facility] THA N L. 07/14/2020 8:00 A M Medical Record Number: [id number] Patient Account Number: [id number] Date of Birth/Sex: Treating RN: 1955-11-15 (65 y.o. [name] Primary Care Provider: [name] Other Clinician: Referring Provider: Treating Provider/Extender: [name] in Treatment: 1 Constitutional Patient is hypotensive. However appears well. Pulse regular and within target range for patient.Marland Kitchen Respirations regular, non-labored and within target range.. Temperature is normal and within the target range for the patient.Marland Kitchen Appears in no distress. Cardiovascular dorsalis pedis pulses palpable but reduced. Notes Wound exam On the left foot he has an area at the base of the fifth metatarsal which was debrided with a #5 curette as well as the Achilles left heel. Both of these have eschar around the wound and on top of the wound surface. Subcutaneous tissue was removed hemostasis with silver nitrate On the right foot lateral part of the fifth  metatarsal this also required debridement. This appears to be doing better. Electronic Signature(s) Signed: 07/14/2020 4:30:53 PM By: Linton Ham MD Entered By: Linton Ham on 07/14/2020 08:29:57 -------------------------------------------------------------------------------- Physician Orders Details Patient Name: Date of Service: [name] [name] [facility]  THA N L. 07/14/2020 8:00 A M Medical Record Number: [id number] Patient Account Number: [id number] Date of Birth/Sex: Treating RN: Jan 14, 1955 (65 y.o. [name], [name] Primary Care Provider: [name] Other Clinician: Referring Provider: Treating Provider/Extender: [name] in Treatment: 1 Verbal / Phone Orders: No Diagnosis Coding ICD-10 Coding Code Description E11.621 Type 2 diabetes mellitus with foot ulcer L97.528 Non-pressure chronic ulcer of other part of left foot with other specified severity L97.511 Non-pressure chronic ulcer of other part of right foot limited to breakdown of skin L89.622 Pressure ulcer of left heel, stage 2 L89.152 Pressure ulcer of sacral region, stage 2 E11.42 Type 2 diabetes mellitus with diabetic polyneuropathy Follow-up Appointments ppointment in 1 week. - Dr. Dellia Nims Return A Bathing/ Shower/ Hygiene May shower and wash wound with soap and water. - with dressing changes only. Off-Loading Turn and reposition every 2 hours Other: - float heels while resting in bed or chair. Use a pillow or rolled sheet under legs to offload the heels. Home Health No change in wound care orders this week; continue Home Health for wound care. May utilize formulary equivalent dressing for wound treatment orders unless otherwise specified. - [facility] home health once a week and family weekly. wound center weekly. Wound Treatment Wound #1 - Calcaneus Wound Laterality: Left Cleanser: Soap and Water (Home Health) 3 x Per Week/30 Days Discharge Instructions: May shower and wash wound with dial  antibacterial soap and water prior to dressing change. Cleanser: Wound Cleanser (Home Health) 3 x Per Week/30 Days Discharge Instructions: Cleanse the wound with wound cleanser prior to applying a clean dressing using gauze sponges, not tissue or cotton balls. Prim Dressing: Iodosorb Gel 10 (gm) Tube (Home Health) 3 x Per Week/30 Days ary Discharge Instructions: ****IODOFLEX OR IODOSORB****Apply to wound bed as instructed Secondary Dressing: Woven Gauze Sponge, Non-Sterile 4x4 in (Home Health) 3 x Per Week/30 Days Discharge Instructions: Apply over primary dressing as directed. Secondary Dressing: ALLEVYN Heel 4 1/2in x 5 1/2in / 10.5cm x 13.5cm (Home Health) 3 x Per Week/30 Days Discharge Instructions: Apply over primary dressing as directed. Secured With: [employer], 4.5x3.1 (in/yd) (Home Health) 3 x Per Week/30 Days Discharge Instructions: Secure with Kerlix as directed. Secured With: 21M Medipore H Soft Cloth Surgical T 4 x 2 (in/yd) (Home Health) 3 x Per Week/30 Days ape Discharge Instructions: Secure dressing with tape as directed. Wound #2 - Foot Wound Laterality: Left, Lateral Cleanser: Soap and Water (Home Health) 3 x Per Week/30 Days Discharge Instructions: May shower and wash wound with dial antibacterial soap and water prior to dressing change. Cleanser: Wound Cleanser (Home Health) 3 x Per Week/30 Days Discharge Instructions: Cleanse the wound with wound cleanser prior to applying a clean dressing using gauze sponges, not tissue or cotton balls. Prim Dressing: Iodosorb Gel 10 (gm) Tube (Home Health) 3 x Per Week/30 Days ary Discharge Instructions: ****IODOFLEX OR IODOSORB****Apply to wound bed as instructed Secondary Dressing: Woven Gauze Sponge, Non-Sterile 4x4 in (Home Health) 3 x Per Week/30 Days Discharge Instructions: Apply over primary dressing as directed. Secured With: [employer], 4.5x3.1 (in/yd) (Home Health) 3 x Per Week/30 Days Discharge  Instructions: Secure with Kerlix as directed. Secured With: 21M Medipore H Soft Cloth Surgical T 4 x 2 (in/yd) (Home Health) 3 x Per Week/30 Days ape Discharge Instructions: Secure dressing with tape as directed. Wound #3 - Foot Wound Laterality: Right, Lateral Cleanser: Soap and Water (Home Health) 3 x Per Week/30 Days Discharge Instructions: May  shower and wash wound with dial antibacterial soap and water prior to dressing change. Cleanser: Wound Cleanser (Home Health) 3 x Per Week/30 Days Discharge Instructions: Cleanse the wound with wound cleanser prior to applying a clean dressing using gauze sponges, not tissue or cotton balls. Prim Dressing: Iodosorb Gel 10 (gm) Tube (Home Health) 3 x Per Week/30 Days ary Discharge Instructions: ****IODOFLEX OR IODOSORB****Apply to wound bed as instructed Secondary Dressing: Woven Gauze Sponge, Non-Sterile 4x4 in (Home Health) 3 x Per Week/30 Days Discharge Instructions: Apply over primary dressing as directed. Secured With: [employer], 4.5x3.1 (in/yd) (Home Health) 3 x Per Week/30 Days Discharge Instructions: Secure with Kerlix as directed. Secured With: 89M Medipore H Soft Cloth Surgical T 4 x 2 (in/yd) (Home Health) 3 x Per Week/30 Days ape Discharge Instructions: Secure dressing with tape as directed. Consults Vascular - Vascular consult to [facility]in and Vascular related to abnormal ABIs. Electronic Signature(s) Signed: 07/14/2020 4:30:53 PM By: Linton Ham MD Signed: 07/14/2020 6:14:19 PM By: [name] Entered By: [name] on 07/14/2020 08:20:23 Prescription 07/14/2020 -------------------------------------------------------------------------------- Jasper Riling MD Patient Name: Provider: May 10, 1955 [id number] Date of Birth: NPI#: Jerilynn Mages [id number] Sex: DEA #: [id number] Phone #: License #: [place] Patient Address: [address], [address] [place], [place] [address] [place],  [address] [phone] Allergies No Known Allergies Provider's Orders Vascular - Vascular consult to Vein and Vascular related to abnormal ABIs. Hand Signature: Date(s): Electronic Signature(s) Signed: 07/14/2020 4:30:53 PM By: Linton Ham MD Signed: 07/14/2020 6:14:19 PM By: [name] Entered By: [name] on 07/14/2020 08:20:23 -------------------------------------------------------------------------------- Problem List Details Patient Name: Date of Service: [name] [name] [facility] THA N L. 07/14/2020 8:00 A M Medical Record Number: [id number] Patient Account Number: [id number] Date of Birth/Sex: Treating RN: 11-May-1955 (65 y.o. [name], [name] Primary Care Provider: [name] Other Clinician: Referring Provider: Treating Provider/Extender: [name] in Treatment: 1 Active Problems ICD-10 Encounter Code Description Active Date MDM Diagnosis E11.621 Type 2 diabetes mellitus with foot ulcer 07/07/2020 No Yes L97.528 Non-pressure chronic ulcer of other part of left foot with other specified 07/07/2020 No Yes severity L97.511 Non-pressure chronic ulcer of other part of right foot limited to breakdown of 07/07/2020 No Yes skin L89.622 Pressure ulcer of left heel, stage 2 07/07/2020 No Yes E11.42 Type 2 diabetes mellitus with diabetic polyneuropathy 07/07/2020 No Yes Inactive Problems ICD-10 Code Description Active Date Inactive Date L89.152 Pressure ulcer of sacral region, stage 2 07/07/2020 07/07/2020 Resolved Problems Electronic Signature(s) Signed: 07/14/2020 4:30:53 PM By: Linton Ham MD Entered By: Linton Ham on 07/14/2020 08:25:02 -------------------------------------------------------------------------------- Progress Note Details Patient Name: Date of Service: [name] [name] [facility] THA N L. 07/14/2020 8:00 A M Medical Record Number: [id number] Patient Account Number: [id number] Date  of Birth/Sex: Treating RN: [birth year] (65 y.o. [name] Primary Care Provider: [name] Other Clinician: Referring Provider: Treating Provider/Extender: [name] in Treatment: 1 Subjective History of Present Illness (HPI) ADMISSION 07/07/2020 This is a 65 year old man who is a type II diabetic. I have reviewed this in epic. It appeared that his diabetes was diagnosed in 2009 at which time he was on metformin. He was followed for a period of time by an endocrinologist with [facility] to always labeled him as a type II diabetic. Recently in epic there is been a switch to type 1 diabetes. If there were defining antibody test  done I do not see them and I think the totality of the evidence would suggest that he is a type II diabetic. His recent problem started in May when he fell down 17 stairs. He suffered a scapular fracture he was hospitalized at [facility] for 9 days and then discharged to [facility]. His mother who is present said that most of these wounds seem to develop surrounding the discharge from hospital or the stay at [facility]. He has an area on his lower sacrum which appears currently to be a stage II but per their description this is actually improved quite a bit. He has an area on the left lateral foot at roughly the base of the fifth metatarsal, the left Achilles heel and the right fifth metatarsal head laterally they have been using topical antibiotics. Seen by primary care yesterday and placed on Keflex The patient had arterial studies on 05/05/2020. I wondered whether this had something to do with wounds on his feet at that time but I have not been able to determine this. At that time his ABI on the right was 1.09 with a TBI of 0.65 on the left 0.88 with a TBI of 0.44. Waveforms were triphasic on the right monophasic on the left. He likely has significant PAD on the left Past medical history includes type 2 diabetes poorly controlled last hemoglobin  A1c at 12.7 in May, hypertension, hyperlipidemia, atrial fib on Eliquis, coronary artery disease status post CABG, left scapular fracture in May of this year, AAA repair. Bilateral leg weakness ABIs were not done in our clinic as they were just done in May of this year at vein and vascular 7/14; patient arrives back in clinic he has areas on the left Achilles heel, base of the left fifth metatarsal, right lateral fifth metatarsal. The area he had over the coccyx appears to be healed but this area remains vulnerable. We have been using Iodoflex. I rereviewed his ABIs which were done in May. I do not know exactly what prompted these test however they do show a TBI of 0.44 on the left and monophasic waveforms. Objective Constitutional Patient is hypotensive. However appears well. Pulse regular and within target range for patient.Marland Kitchen Respirations regular, non-labored and within target range.. Temperature is normal and within the target range for the patient.Marland Kitchen Appears in no distress. Vitals Time Taken: 7:54 AM, Height: 74 in, Weight: 151 lbs, BMI: 19.4, Temperature: 98.4 F, Pulse: 68 bpm, Respiratory Rate: 16 breaths/min, Blood Pressure: 92/61 mmHg, Capillary Blood Glucose: 206 mg/dl. General Notes: glucose per pt report Cardiovascular dorsalis pedis pulses palpable but reduced. General Notes: Wound exam oo On the left foot he has an area at the base of the fifth metatarsal which was debrided with a #5 curette as well as the Achilles left heel. Both of these have eschar around the wound and on top of the wound surface. Subcutaneous tissue was removed hemostasis with silver nitrate oo On the right foot lateral part of the fifth metatarsal this also required debridement. This appears to be doing better. Integumentary (Hair, Skin) Wound #1 status is Open. Original cause of wound was Pressure Injury. The date acquired was: 05/23/2020. The wound has been in treatment 1 weeks. The wound is located on the  Left Calcaneus. The wound measures 3.2cm length x 1.1cm width x 0.2cm depth; 2.765cm^2 area and 0.553cm^3 volume. There is Fat Layer (Subcutaneous Tissue) exposed. There is no tunneling noted, however, there is undermining starting at 12:00 and ending at 2:00  with a maximum distance of 0.3cm. There is a small amount of serosanguineous drainage noted. The wound margin is distinct with the outline attached to the wound base. There is small (1-33%) red granulation within the wound bed. There is a large (67-100%) amount of necrotic tissue within the wound bed including Eschar and Adherent Slough. Wound #2 status is Open. Original cause of wound was Gradually Appeared. The date acquired was: 06/21/2020. The wound has been in treatment 1 weeks. The wound is located on the Left,Lateral Foot. The wound measures 1.8cm length x 1.3cm width x 0.2cm depth; 1.838cm^2 area and 0.368cm^3 volume. There is Fat Layer (Subcutaneous Tissue) exposed. There is no tunneling or undermining noted. There is a small amount of serosanguineous drainage noted. The wound margin is distinct with the outline attached to the wound base. There is no granulation within the wound bed. There is a large (67-100%) amount of necrotic tissue within the wound bed including Eschar. Wound #3 status is Open. Original cause of wound was Gradually Appeared. The date acquired was: 06/21/2020. The wound has been in treatment 1 weeks. The wound is located on the Right,Lateral Foot. The wound measures 1.4cm length x 1.1cm width x 0.2cm depth; 1.21cm^2 area and 0.242cm^3 volume. There is Fat Layer (Subcutaneous Tissue) exposed. There is no tunneling noted, however, there is undermining starting at 6:00 and ending at 12:00 with a maximum distance of 0.4cm. There is a medium amount of purulent drainage noted. The wound margin is distinct with the outline attached to the wound base. There is large (67-100%) red granulation within the wound bed. There is a  small (1-33%) amount of necrotic tissue within the wound bed. Wound #4 status is Healed - Epithelialized. Original cause of wound was Pressure Injury. The date acquired was: 05/23/2020. The wound has been in treatment 1 weeks. The wound is located on the Sacrum. The wound measures 0cm length x 0cm width x 0cm depth; 0cm^2 area and 0cm^3 volume. 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 There was a Excisional Skin/Subcutaneous Tissue Debridement with a total area of 3.52 sq cm performed by Ricard Dillon., MD. With the following instrument(s): Curette to remove Viable and Non-Viable tissue/material. Material removed includes Eschar, Subcutaneous Tissue, Skin: Dermis, Skin: Epidermis, and Fibrin/Exudate after achieving pain control using Lidocaine 4% Topical Solution. A time out was conducted at 08:10, prior to the start of the procedure. A Minimum amount of bleeding was controlled with Pressure. The procedure was tolerated well with a pain level of 0 throughout and a pain level of 0 following the procedure. Post Debridement Measurements: 3.2cm length x 1.1cm width x 0.2cm depth; 0.553cm^3 volume. Post debridement Stage noted as Category/Stage III. Character of Wound/Ulcer Post Debridement requires further debridement. Post procedure Diagnosis Wound #1: Same as Pre-Procedure Wound #2 Pre-procedure diagnosis of Wound #2 is a Diabetic Wound/Ulcer of the Lower Extremity located on the Left,Lateral Foot .Severity of Tissue Pre Debridement is: Fat layer exposed. There was a Excisional Skin/Subcutaneous  Tissue Debridement with a total area of 2.34 sq cm performed by Ricard Dillon., MD. With the following instrument(s): Curette to remove Viable  and Non-Viable tissue/material. Material removed includes Eschar, Subcutaneous Tissue, Skin: Dermis, Skin: Epidermis, and Fibrin/Exudate after achieving pain control using Lidocaine 4% Topical Solution. A time out was conducted at 08:10, prior to the start of the procedure. A Minimum amount of bleeding was controlled with Pressure. The procedure was tolerated well with a pain level of 0 throughout and a pain level of 0 following the procedure. Post Debridement Measurements: 1.8cm length x 1.3cm width x 0.2cm depth; 0.368cm^3 volume. Character of Wound/Ulcer Post Debridement requires further debridement. Severity of Tissue Post Debridement is: Fat layer exposed. Post procedure Diagnosis Wound #2: Same as Pre-Procedure Plan Follow-up Appointments: Return Appointment in 1 week. - Dr. Dellia Nims Bathing/ Shower/ Hygiene: May shower and wash wound with soap and water. - with dressing changes only. Off-Loading: Turn and reposition every 2 hours Other: - float heels while resting in bed or chair. Use a pillow or rolled sheet under legs to offload the heels. Home Health: No change in wound care orders this week; continue Home Health for wound care. May utilize formulary equivalent dressing for wound treatment orders unless otherwise specified. - [facility] home health once a week and family weekly. wound center weekly. Consults ordered were: Vascular - Vascular consult to Vein and Vascular related to abnormal ABIs. WOUND #1: - Calcaneus Wound Laterality: Left Cleanser: Soap and Water (Home Health) 3 x Per Week/30 Days Discharge Instructions: May shower and wash wound with dial antibacterial soap and water prior to dressing change. Cleanser: Wound Cleanser (Home Health) 3 x Per Week/30 Days Discharge Instructions: Cleanse the wound with wound cleanser prior to  applying a clean dressing using gauze sponges, not tissue or cotton balls. Prim Dressing: Iodosorb Gel 10 (gm) Tube (Home Health) 3 x Per Week/30 Days ary Discharge Instructions: ****IODOFLEX OR IODOSORB****Apply to wound bed as instructed Secondary Dressing: Woven Gauze Sponge, Non-Sterile 4x4 in (Home Health) 3 x Per Week/30 Days Discharge Instructions: Apply over primary dressing as directed. Secondary Dressing: ALLEVYN Heel 4 1/2in x 5 1/2in / 10.5cm x 13.5cm (Home Health) 3 x Per Week/30 Days Discharge Instructions: Apply over primary dressing as directed. Secured With: [employer], 4.5x3.1 (in/yd) (Home Health) 3 x Per Week/30 Days Discharge Instructions: Secure with Kerlix as directed. Secured With: 54M Medipore H Soft Cloth Surgical T 4 x 2 (in/yd) (Home Health) 3 x Per Week/30 Days ape Discharge Instructions: Secure dressing with tape as directed. WOUND #2: - Foot Wound Laterality: Left, Lateral Cleanser: Soap and Water (Home Health) 3 x Per Week/30 Days Discharge Instructions: May shower and wash wound with dial antibacterial soap and water prior to dressing change. Cleanser: Wound Cleanser (Home Health) 3 x Per Week/30 Days Discharge Instructions: Cleanse the wound with wound cleanser prior to applying a clean dressing using gauze sponges, not tissue or cotton balls. Prim Dressing: Iodosorb Gel 10 (gm) Tube (Home Health) 3 x Per Week/30 Days ary Discharge Instructions: ****IODOFLEX OR IODOSORB****Apply to wound bed as instructed Secondary Dressing: Woven Gauze Sponge, Non-Sterile 4x4 in (Home Health) 3 x Per Week/30 Days Discharge Instructions: Apply over primary dressing as directed. Secured With: [employer], 4.5x3.1 (in/yd) (Home Health) 3 x Per Week/30 Days Discharge Instructions: Secure with Kerlix as directed. Secured With: 54M Medipore H Soft Cloth Surgical T 4 x 2 (in/yd) (Home Health) 3 x Per Week/30 Days ape Discharge Instructions: Secure dressing with  tape as directed. WOUND #3: - Foot Wound Laterality: Right, Lateral Cleanser: Soap and Water (Home Health) 3 x Per Week/30 Days Discharge  Instructions: May shower and wash wound with dial antibacterial soap and water prior to dressing change. Cleanser: Wound Cleanser (Home Health) 3 x Per Week/30 Days Discharge Instructions: Cleanse the wound with wound cleanser prior to applying a clean dressing using gauze sponges, not tissue or cotton balls. Prim Dressing: Iodosorb Gel 10 (gm) Tube (Home Health) 3 x Per Week/30 Days ary Discharge Instructions: ****IODOFLEX OR IODOSORB****Apply to wound bed as instructed Secondary Dressing: Woven Gauze Sponge, Non-Sterile 4x4 in (Home Health) 3 x Per Week/30 Days Discharge Instructions: Apply over primary dressing as directed. Secured With: [employer], 4.5x3.1 (in/yd) (Home Health) 3 x Per Week/30 Days Discharge Instructions: Secure with Kerlix as directed. Secured With: 59M Medipore H Soft Cloth Surgical T 4 x 2 (in/yd) (Home Health) 3 x Per Week/30 Days ape Discharge Instructions: Secure dressing with tape as directed. 1. Continue with Iodoflex which can be changed every 72 hours. 2. He has home health coming once ideally his family will change this once 3. I think we need to have vein and vascular look at the left leg I think he may need an angiogram here 4. No evidence of infection Electronic Signature(s) Signed: 07/14/2020 4:30:53 PM By: Linton Ham MD Entered By: Linton Ham on 07/14/2020 08:30:43 -------------------------------------------------------------------------------- SuperBill Details Patient Name: Date of Service: [name] [name] HNA THA N L. 07/14/2020 Medical Record Number: [id number] Patient Account Number: [id number] Date of Birth/Sex: Treating RN: 18-Sep-1955 (65 y.o. [name], [name] Primary Care Provider: [name] Other Clinician: Referring Provider: Treating Provider/Extender: [name] in Treatment: 1 Diagnosis Coding ICD-10 Codes Code Description [phone] Type 2 diabetes mellitus with foot ulcer L97.528 Non-pressure chronic ulcer of other part of left foot with other specified severity L97.511 Non-pressure chronic ulcer of other part of right foot limited to breakdown of skin L89.622 Pressure ulcer of left heel, stage 2 E11.42 Type 2 diabetes mellitus with diabetic polyneuropathy Facility Procedures CPT4 Code: [id number] Description: 12878 - DEB SUBQ TISSUE 20 SQ CM/< ICD-10 Diagnosis Description L89.622 Pressure ulcer of left heel, stage 2 L97.528 Non-pressure chronic ulcer of other part of left foot with other specified sev L97.511 Non-pressure chronic ulcer of other  part of right foot limited to breakdown of Modifier: erity skin Quantity: 1 Physician Procedures : CPT4 Code Description Modifier [id number] 11042 - WC PHYS SUBQ TISS 20 SQ CM ICD-10 Diagnosis Description L89.622 Pressure ulcer of left heel, stage 2 L97.528 Non-pressure chronic ulcer of other part of left foot with other specified severity L97.511  Non-pressure chronic ulcer of other part of right foot limited to breakdown of skin Quantity: 1 Electronic Signature(s) Signed: 07/14/2020 4:30:53 PM By: Linton Ham MD Entered By: Linton Ham on 07/14/2020 08:31:03

## 2020-07-14 NOTE — Progress Notes (Addendum)
[name], [name] ([id number]) Visit Report for 07/14/2020 Arrival Information Details Patient Name: Date of Service: [name] [name] [facility] TH[facility] N L. 07/14/2020 8:00 A M Medical Record Number: [id number] Patient Account Number: [id number] Date of Birth/Sex: Treating RN: 01-06-1955 (65 y.o. [name] Primary Care Rolla Kedzierski: [name] Other Clinician: Referring Talitha Dicarlo: Treating Steele Ledonne/Extender: [name] in Treatment: 1 Visit Information History Since Last Visit Added or deleted any medications: No Patient Arrived: Wheel Chair Any new allergies or adverse reactions: No Arrival Time: 07:53 Had a fall or experienced change in No Accompanied By: family member activities of daily living that may affect Transfer Assistance: None risk of falls: Patient Identification Verified: Yes Signs or symptoms of abuse/neglect since last visito No Secondary Verification Process Completed: Yes Hospitalized since last visit: No Patient Requires Transmission-Based Precautions: No Implantable device outside of the clinic excluding No Patient Has Alerts: Yes cellular tissue based products placed in the center Patient Alerts: Patient on Blood Thinner since last visit: Has Dressing in Place as Prescribed: Yes Has Footwear/Offloading in Place as Prescribed: No Left: Wedge Shoe Right: Wedge Shoe Pain Present Now: No Notes Unable to wear offloading shoes trips over the shoes. Electronic Signature(s) Signed: 07/14/2020 6:01:10 PM By: Levan Hurst RN, BSN Entered By: Levan Hurst on 07/14/2020 08:01:55 -------------------------------------------------------------------------------- Encounter Discharge Information Details Patient Name: Date of Service: [name] [name] [facility] THA N L. 07/14/2020 8:00 A M Medical Record Number: [id number] Patient Account Number: [id number] Date of Birth/Sex: Treating RN: 1955-05-17 (65 y.o. [name] Primary Care Loma [place]: [name]  Other Clinician: Referring Zaineb Nowaczyk: Treating Ginevra Tacker/Extender: [name] in Treatment: 1 Encounter Discharge Information Items Post Procedure Vitals Discharge Condition: Stable Temperature (F): 98.4 Ambulatory Status: Ambulatory Pulse (bpm): 68 Discharge Destination: Home Respiratory Rate (breaths/min): 16 Transportation: Private Auto Blood Pressure (mmHg): 92/61 Accompanied By: alone Schedule Follow-up Appointment: Yes Clinical Summary of Care: Patient Declined Electronic Signature(s) Signed: 07/14/2020 6:01:10 PM By: Levan Hurst RN, BSN Entered By: Levan Hurst on 07/14/2020 09:49:40 -------------------------------------------------------------------------------- Lower Extremity Assessment Details Patient Name: Date of Service: [name] [name] [facility] N L. 07/14/2020 8:00 A M Medical Record Number: [id number] Patient Account Number: [id number] Date of Birth/Sex: Treating RN: [birth year] (65 y.o. [name] Primary Care Barry Faircloth: [name] Other Clinician: Referring Jefferey Lippmann: Treating Katrinna Travieso/Extender: [name] in Treatment: 1 Edema Assessment Assessed: [name]: Yes] [name]: Yes] Edema: [Left: No] [Right: No] Calf Left: Right: Point of Measurement: 34 cm From Medial Instep 29.5 cm 29.5 cm Ankle Left: Right: Point of Measurement: 10 cm From Medial Instep 21 cm 21 cm Vascular Assessment Pulses: Dorsalis Pedis Palpable: [Left:Yes] [Right:Yes] Electronic Signature(s) Signed: 07/14/2020 6:01:10 PM By: Levan Hurst RN, BSN Entered By: Levan Hurst on 07/14/2020 08:02:23 -------------------------------------------------------------------------------- Multi Wound Chart Details Patient Name: Date of Service: [name] [name] [facility] N L. 07/14/2020 8:00 A M Medical Record Number: [id number] Patient Account Number: [id number] Date of Birth/Sex: Treating RN: [birth year] (65 y.o. [name] Primary Care  Janelys Glassner: [name] Other Clinician: Referring Aishah Teffeteller: Treating Jori Frerichs/Extender: [name] in Treatment: 1 Vital Signs Height(in): 74 Capillary Blood Glucose(mg/dl): 206 Weight(lbs): 151 Pulse(bpm): 95 Body Mass Index(BMI): 19 Blood Pressure(mmHg): 92/61 Temperature(F): 98.4 Respiratory Rate(breaths/min): 16 Photos: [1:No Photos Left Calcaneus] [2:No Photos Left, Lateral Foot] [3:No Photos Right, Lateral Foot] Wound Location: [1:Pressure Injury] [2:Gradually Appeared] [3:Gradually Appeared] Wounding Event: [1:Pressure Ulcer] [2:Diabetic Wound/Ulcer of the Lower] [3:Diabetic Wound/Ulcer of the Lower] Primary Etiology: [1:Coronary  Artery Disease,] [2:Extremity Coronary Artery Disease,] [3:Extremity Coronary Artery Disease,] Comorbid History: [1:Hypertension, Type II Diabetes, Osteoarthritis, Neuropathy 05/23/2020] [2:Hypertension, Type II Diabetes, Osteoarthritis, Neuropathy 06/21/2020] [3:Hypertension, Type II Diabetes, Osteoarthritis, Neuropathy 06/21/2020] Date Acquired: [1:1] [2:1] [3:1] Weeks of Treatment: [1:Open] [2:Open] [3:Open] Wound Status: [1:3.2x1.1x0.2] [2:1.8x1.3x0.2] [3:1.4x1.1x0.2] Measurements L x W x D (cm) [1:2.765] [2:1.838] [3:1.21] A (cm) : rea [1:0.553] [2:0.368] [3:0.242] Volume (cm) : [1:78.00%] [2:-160.00%] [3:39.60%] % Reduction in A rea: [1:78.00%] [2:-418.30%] [3:39.70%] % Reduction in Volume: [1:12] [3:6] Starting Position 1 (o'clock): [1:2] [3:12] Ending Position 1 (o'clock): [1:0.3] [3:0.4] Maximum Distance 1 (cm): [1:Yes] [2:No] [3:Yes] Undermining: [1:Category/Stage III] [2:Grade 1] [3:Grade 1] Classification: [1:Small] [2:Small] [3:Medium] Exudate A mount: [1:Serosanguineous] [2:Serosanguineous] [3:Purulent] Exudate Type: [1:red, brown] [2:red, brown] [3:yellow, brown, green] Exudate Color: [1:Distinct, outline attached] [2:Distinct, outline attached] [3:Distinct, outline attached] Wound Margin: [1:Small  (1-33%)] [2:None Present (0%)] [3:Large (67-100%)] Granulation A mount: [1:Red] [2:N/A] [3:Red] Granulation Quality: [1:Large (67-100%)] [2:Large (67-100%)] [3:Small (1-33%)] Necrotic A mount: [1:Eschar, Adherent Slough] [2:Eschar] [3:N/A] Necrotic Tissue: [1:Fat Layer (Subcutaneous Tissue): Yes Fat Layer (Subcutaneous Tissue): Yes Fat Layer (Subcutaneous Tissue): Yes] Exposed Structures: [1:Fascia: No Tendon: No Muscle: No Joint: No Bone: No None] [2:Fascia: No Tendon: No Muscle: No Joint: No Bone: No None] [3:Fascia: No Tendon: No Muscle: No Joint: No Bone: No None] Epithelialization: [1:Debridement - Excisional] [2:Debridement - Excisional] [3:N/A] Debridement: Pre-procedure Verification/Time Out 08:10 [2:08:10] [3:N/A] Taken: [1:Lidocaine 4% Topical Solution] [2:Lidocaine 4% Topical Solution] [3:N/A] Pain Control: [1:Necrotic/Eschar, Subcutaneous] [2:Necrotic/Eschar, Subcutaneous] [3:N/A] Tissue Debrided: [1:Skin/Subcutaneous Tissue] [2:Skin/Subcutaneous Tissue] [3:N/A] Level: [1:3.52] [2:2.34] [3:N/A] Debridement A (sq cm): [1:rea Curette] [2:Curette] [3:N/A] Instrument: [1:Minimum] [2:Minimum] [3:N/A] Bleeding: [1:Pressure] [2:Pressure] [3:N/A] Hemostasis A chieved: [1:0] [2:0] [3:N/A] Procedural Pain: [1:0] [2:0] [3:N/A] Post Procedural Pain: [1:Procedure was tolerated well] [2:Procedure was tolerated well] [3:N/A] Debridement Treatment Response: [1:3.2x1.1x0.2] [2:1.8x1.3x0.2] [3:N/A] Post Debridement Measurements L x W x D (cm) [1:0.553] [2:0.368] [3:N/A] Post Debridement Volume: (cm) [1:Category/Stage III] [2:N/A] [3:N/A] Post Debridement Stage: [1:Debridement] [2:Debridement] [3:N/A] Wound Number: 4 N/A N/A Photos: No Photos N/A N/A Sacrum N/A N/A Wound Location: Pressure Injury N/A N/A Wounding Event: Pressure Ulcer N/A N/A Primary Etiology: N/A N/A N/A Comorbid History: 05/23/2020 N/A N/A Date A cquired: 1 N/A N/A Weeks of Treatment: Healed - Epithelialized N/A  N/A Wound Status: 0x0x0 N/A N/A Measurements L x W x D (cm) 0 N/A N/A A (cm) : rea 0 N/A N/A Volume (cm) : 100.00% N/A N/A % Reduction in A rea: 100.00% N/A N/A % Reduction in Volume: N/A N/A N/A Undermining: Category/Stage II N/A N/A Classification: N/A N/A N/A Exudate A mount: N/A N/A N/A Exudate Type: N/A N/A N/A Exudate Color: N/A N/A N/A Wound Margin: N/A N/A N/A Granulation A mount: N/A N/A N/A Granulation Quality: N/A N/A N/A Necrotic A mount: N/A N/A N/A Necrotic Tissue: N/A N/A N/A Exposed Structures: N/A N/A N/A Epithelialization: N/A N/A N/A Debridement: N/A N/A N/A Pain Control: N/A N/A N/A Tissue Debrided: N/A N/A N/A Level: N/A N/A N/A Debridement A (sq cm): rea N/A N/A N/A Instrument: N/A N/A N/A Bleeding: N/A N/A N/A Hemostasis A chieved: N/A N/A N/A Procedural Pain: N/A N/A N/A Post Procedural Pain: N/A N/A N/A Debridement Treatment Response: N/A N/A N/A Post Debridement Measurements L x W x D (cm) N/A N/A N/A Post Debridement Volume: (cm) N/A N/A N/A Post Debridement Stage: N/A N/A N/A Procedures Performed: Treatment Notes Electronic Signature(s) Signed: 07/14/2020 4:30:53 PM By: Linton Ham MD Signed: 07/14/2020 6:14:19 PM By: [name] Entered By: Dellia Nims,  Michael on 07/14/2020 08:25:12 -------------------------------------------------------------------------------- Multi-Disciplinary Care Plan Details Patient Name: Date of Service: [name] [name] [facility] THA N L. 07/14/2020 8:00 A M Medical Record Number: [id number] Patient Account Number: [id number] Date of Birth/Sex: Treating RN: Jan 04, 1955 (65 y.o. [name], [name] Primary Care Zimir Kittleson: [name] Other Clinician: Referring Janara Klett: Treating Antonina Deziel/Extender: [name] in Treatment: 1 Active Inactive Pain, Acute or Chronic Nursing Diagnoses: Pain, acute or chronic: actual or potential Potential alteration in comfort,  pain Goals: Patient will verbalize adequate pain control and receive pain control interventions during procedures as needed Date Initiated: 07/07/2020 Target Resolution Date: 07/28/2020 Goal Status: Active Patient/caregiver will verbalize comfort level met Date Initiated: 07/07/2020 Target Resolution Date: 08/03/2020 Goal Status: Active Interventions: Encourage patient to take pain medications as prescribed Provide education on pain management Reposition patient for comfort Treatment Activities: Administer pain control measures as ordered : 07/07/2020 Notes: Wound/Skin Impairment Nursing Diagnoses: Knowledge deficit related to ulceration/compromised skin integrity Goals: Patient/caregiver will verbalize understanding of skin care regimen Date Initiated: 07/07/2020 Target Resolution Date: 07/14/2020 Goal Status: Active Interventions: Assess patient/caregiver ability to obtain necessary supplies Assess patient/caregiver ability to perform ulcer/skin care regimen upon admission and as needed Provide education on ulcer and skin care Treatment Activities: Skin care regimen initiated : 07/07/2020 Topical wound management initiated : 07/07/2020 Notes: Electronic Signature(s) Signed: 07/14/2020 6:14:19 PM By: [name] Entered By: [name] on 07/14/2020 08:08:09 -------------------------------------------------------------------------------- Pain Assessment Details Patient Name: Date of Service: [name] [name] [facility] N L. 07/14/2020 8:00 A M Medical Record Number: [id number] Patient Account Number: [id number] Date of Birth/Sex: Treating RN: [birth year] (65 y.o. [name] Primary Care Havannah Streat: [name] Other Clinician: Referring Maybelle Depaoli: Treating Infiniti Hoefling/Extender: [name] in Treatment: 1 Active Problems Location of Pain Severity and Description of Pain Patient Has Paino No Site Locations Rate the pain. Current Pain Level: 0 Pain Management  and Medication Current Pain Management: Medication: No Cold Application: No Rest: No Massage: No Activity: No T.E.N.S.: No Heat Application: No Leg drop or elevation: No Is the Current Pain Management Adequate: Adequate How does your wound impact your activities of daily livingo Sleep: No Bathing: No Appetite: No Relationship With Others: No Bladder Continence: No Emotions: No Bowel Continence: No Work: No Toileting: No Drive: No Dressing: No Hobbies: No Electronic Signature(s) Signed: 07/14/2020 6:01:10 PM By: Levan Hurst RN, BSN Entered By: Levan Hurst on 07/14/2020 08:01:24 -------------------------------------------------------------------------------- Patient/Caregiver Education Details Patient Name: Date of Service: [name] [name] [facility] THA N L. 7/14/2022andnbsp8:00 A M Medical Record Number: [id number] Patient Account Number: [id number] Date of Birth/Gender: Treating RN: 1955/12/11 (65 y.o. [name] Primary Care Physician: [name] Other Clinician: Referring Physician: Treating Physician/Extender: [name] in Treatment: 1 Education Assessment Education Provided To: Patient and Caregiver Education Topics Provided Wound/Skin Impairment: Handouts: Skin Care Do's and Dont's Methods: Explain/Verbal Responses: Reinforcements needed Electronic Signature(s) Signed: 07/14/2020 6:14:19 PM By: [name] Entered By: [name] on 07/14/2020 08:08:19 -------------------------------------------------------------------------------- Wound Assessment Details Patient Name: Date of Service: [name] [name] [facility] TH[facility] N L. 07/14/2020 8:00 A M Medical Record Number: [id number] Patient Account Number: [id number] Date of Birth/Sex: Treating RN: Dec 03, 1955 (65 y.o. [name] Primary Care Trueman Worlds: [name] Other Clinician: Referring Giann Obara: Treating Quantae Martel/Extender: [name] in Treatment:  1 Wound Status Wound Number: 1 Primary Pressure Ulcer Etiology: Wound Location: Left Calcaneus Wound Open Wounding Event: Pressure Injury Status: Date Acquired: 05/23/2020 Comorbid Coronary Artery Disease,  Hypertension, Type II Diabetes, Weeks Of Treatment: 1 History: Osteoarthritis, Neuropathy Clustered Wound: No Photos Photo Uploaded By: Sandre Kitty on 07/15/2020 11:57:09 Wound Measurements Length: (cm) 3.2 Width: (cm) 1.1 Depth: (cm) 0.2 Area: (cm) 2.765 Volume: (cm) 0.553 % Reduction in Area: 78% % Reduction in Volume: 78% Epithelialization: None Tunneling: No Undermining: Yes Starting Position (o'clock): 12 Ending Position (o'clock): 2 Maximum Distance: (cm) 0.3 Wound Description Classification: Category/Stage III Wound Margin: Distinct, outline attached Exudate Amount: Small Exudate Type: Serosanguineous Exudate Color: red, brown Foul Odor After Cleansing: No Slough/Fibrino Yes Wound Bed Granulation Amount: Small (1-33%) Exposed Structure Granulation Quality: Red Fascia Exposed: No Necrotic Amount: Large (67-100%) Fat Layer (Subcutaneous Tissue) Exposed: Yes Necrotic Quality: Eschar, Adherent Slough Tendon Exposed: No Muscle Exposed: No Joint Exposed: No Bone Exposed: No Treatment Notes Wound #1 (Calcaneus) Wound Laterality: Left Cleanser Soap and Water Discharge Instruction: May shower and wash wound with dial antibacterial soap and water prior to dressing change. Wound Cleanser Discharge Instruction: Cleanse the wound with wound cleanser prior to applying a clean dressing using gauze sponges, not tissue or cotton balls. Peri-Wound Care Topical Primary Dressing Iodosorb Gel 10 (gm) Tube Discharge Instruction: ****IODOFLEX OR IODOSORB****Apply to wound bed as instructed Secondary Dressing Woven Gauze Sponge, Non-Sterile 4x4 in Discharge Instruction: Apply over primary dressing as directed. ALLEVYN Heel 4 1/2in x 5 1/2in / 10.5cm x  13.5cm Discharge Instruction: Apply over primary dressing as directed. Secured With [employer], 4.5x3.1 (in/yd) Discharge Instruction: Secure with Kerlix as directed. 25M Medipore H Soft Cloth Surgical T 4 x 2 (in/yd) ape Discharge Instruction: Secure dressing with tape as directed. Compression Wrap Compression Stockings Add-Ons Electronic Signature(s) Signed: 07/14/2020 6:01:10 PM By: Levan Hurst RN, BSN Entered By: Levan Hurst on 07/14/2020 08:04:51 -------------------------------------------------------------------------------- Wound Assessment Details Patient Name: Date of Service: [name] [name] [facility] N L. 07/14/2020 8:00 A M Medical Record Number: [id number] Patient Account Number: [id number] Date of Birth/Sex: Treating RN: 1955/03/14 (65 y.o. [name] Primary Care Daionna Crossland: [name] Other Clinician: Referring Johonna Binette: Treating Carrigan Delafuente/Extender: [name] in Treatment: 1 Wound Status Wound Number: 2 Primary Diabetic Wound/Ulcer of the Lower Extremity Etiology: Wound Location: Left, Lateral Foot Wound Open Wounding Event: Gradually Appeared Status: Date Acquired: 06/21/2020 Comorbid Coronary Artery Disease, Hypertension, Type II Diabetes, Weeks Of Treatment: 1 History: Osteoarthritis, Neuropathy Clustered Wound: No Photos Photo Uploaded By: Sandre Kitty on 07/15/2020 11:57:10 Wound Measurements Length: (cm) 1.8 Width: (cm) 1.3 Depth: (cm) 0.2 Area: (cm) 1.838 Volume: (cm) 0.368 % Reduction in Area: -160% % Reduction in Volume: -418.3% Epithelialization: None Tunneling: No Undermining: No Wound Description Classification: Grade 1 Wound Margin: Distinct, outline attached Exudate Amount: Small Exudate Type: Serosanguineous Exudate Color: red, brown Foul Odor After Cleansing: No Slough/Fibrino No Wound Bed Granulation Amount: None Present (0%) Exposed Structure Necrotic Amount: Large  (67-100%) Fascia Exposed: No Necrotic Quality: Eschar Fat Layer (Subcutaneous Tissue) Exposed: Yes Tendon Exposed: No Muscle Exposed: No Joint Exposed: No Bone Exposed: No Treatment Notes Wound #2 (Foot) Wound Laterality: Left, Lateral Cleanser Soap and Water Discharge Instruction: May shower and wash wound with dial antibacterial soap and water prior to dressing change. Wound Cleanser Discharge Instruction: Cleanse the wound with wound cleanser prior to applying a clean dressing using gauze sponges, not tissue or cotton balls. Peri-Wound Care Topical Primary Dressing Iodosorb Gel 10 (gm) Tube Discharge Instruction: ****IODOFLEX OR IODOSORB****Apply to wound bed as instructed Secondary Dressing Woven Gauze Sponge, Non-Sterile 4x4 in Discharge Instruction: Apply over primary  dressing as directed. Secured With [employer], 4.5x3.1 (in/yd) Discharge Instruction: Secure with Kerlix as directed. 39M Medipore H Soft Cloth Surgical T 4 x 2 (in/yd) ape Discharge Instruction: Secure dressing with tape as directed. Compression Wrap Compression Stockings Add-Ons Electronic Signature(s) Signed: 07/14/2020 6:01:10 PM By: Levan Hurst RN, BSN Entered By: Levan Hurst on 07/14/2020 08:05:29 -------------------------------------------------------------------------------- Wound Assessment Details Patient Name: Date of Service: [name] [name] [facility] TH[facility] N L. 07/14/2020 8:00 A M Medical Record Number: [id number] Patient Account Number: [id number] Date of Birth/Sex: Treating RN: 1955/12/08 (65 y.o. [name] Primary Care Patrick Salemi: [name] Other Clinician: Referring Ankit Degregorio: Treating Julane Crock/Extender: [name] in Treatment: 1 Wound Status Wound Number: 3 Primary Diabetic Wound/Ulcer of the Lower Extremity Etiology: Wound Location: Right, Lateral Foot Wound Open Wounding Event: Gradually Appeared Status: Date Acquired: 06/21/2020 Comorbid  Coronary Artery Disease, Hypertension, Type II Diabetes, Weeks Of Treatment: 1 History: Osteoarthritis, Neuropathy Clustered Wound: No Photos Wound Measurements Length: (cm) 1.4 Width: (cm) 1.1 Depth: (cm) 0.2 Area: (cm) 1.21 Volume: (cm) 0.242 % Reduction in Area: 39.6% % Reduction in Volume: 39.7% Epithelialization: None Tunneling: No Undermining: Yes Starting Position (o'clock): 6 Ending Position (o'clock): 12 Maximum Distance: (cm) 0.4 Wound Description Classification: Grade 1 Wound Margin: Distinct, outline attached Exudate Amount: Medium Exudate Type: Purulent Exudate Color: yellow, brown, green Foul Odor After Cleansing: No Slough/Fibrino Yes Wound Bed Granulation Amount: Large (67-100%) Exposed Structure Granulation Quality: Red Fascia Exposed: No Necrotic Amount: Small (1-33%) Fat Layer (Subcutaneous Tissue) Exposed: Yes Tendon Exposed: No Muscle Exposed: No Joint Exposed: No Bone Exposed: No Treatment Notes Wound #3 (Foot) Wound Laterality: Right, Lateral Cleanser Soap and Water Discharge Instruction: May shower and wash wound with dial antibacterial soap and water prior to dressing change. Wound Cleanser Discharge Instruction: Cleanse the wound with wound cleanser prior to applying a clean dressing using gauze sponges, not tissue or cotton balls. Peri-Wound Care Topical Primary Dressing Iodosorb Gel 10 (gm) Tube Discharge Instruction: ****IODOFLEX OR IODOSORB****Apply to wound bed as instructed Secondary Dressing Woven Gauze Sponge, Non-Sterile 4x4 in Discharge Instruction: Apply over primary dressing as directed. Secured With [employer], 4.5x3.1 (in/yd) Discharge Instruction: Secure with Kerlix as directed. 39M Medipore H Soft Cloth Surgical T 4 x 2 (in/yd) ape Discharge Instruction: Secure dressing with tape as directed. Compression Wrap Compression Stockings Add-Ons Electronic Signature(s) Signed: 07/18/2020 3:49:49 PM By: Sandre Kitty Signed: 07/18/2020 5:04:52 PM By: Levan Hurst RN, BSN Previous Signature: 07/14/2020 6:01:10 PM Version By: Levan Hurst RN, BSN Entered By: Sandre Kitty on 07/15/2020 11:59:34 -------------------------------------------------------------------------------- Wound Assessment Details Patient Name: Date of Service: [name] [name] [facility] TH[facility] N L. 07/14/2020 8:00 A M Medical Record Number: [id number] Patient Account Number: [id number] Date of Birth/Sex: Treating RN: 1955-09-03 (65 y.o. [name] Primary Care Betul Brisky: [name] Other Clinician: Referring Francois Elk: Treating Khalila Buechner/Extender: [name] in Treatment: 1 Wound Status Wound Number: 4 Primary Etiology: Pressure Ulcer Wound Location: Sacrum Wound Status: Healed - Epithelialized Wounding Event: Pressure Injury Date Acquired: 05/23/2020 Weeks Of Treatment: 1 Clustered Wound: No Photos Photo Uploaded By: Sandre Kitty on 07/15/2020 12:04:18 Wound Measurements Length: (cm) Width: (cm) Depth: (cm) Area: (cm) Volume: (cm) 0 % Reduction in Area: 100% 0 % Reduction in Volume: 100% 0 0 0 Wound Description Classification: Category/Stage II Treatment Notes Wound #4 (Sacrum) Cleanser Peri-Wound Care Topical Primary Dressing Secondary Dressing Secured With Compression Wrap Compression Stockings Add-Ons Electronic Signature(s) Signed: 07/14/2020 6:01:10 PM By: Levan Hurst  RN, BSN Entered By: Levan Hurst on 07/14/2020 08:05:42 -------------------------------------------------------------------------------- [place] Details Patient Name: Date of Service: [name] [name] [facility] TH[facility] N L. 07/14/2020 8:00 A M Medical Record Number: [id number] Patient Account Number: [id number] Date of Birth/Sex: Treating RN: 07/25/1955 (65 y.o. [name] Primary Care Palak Tercero: [name] Other Clinician: Referring Libbey Duce: Treating Mahamud Metts/Extender: [name] in Treatment: 1 Vital Signs Time Taken: 07:54 Temperature (F): 98.4 Height (in): 74 Pulse (bpm): 68 Weight (lbs): 151 Respiratory Rate (breaths/min): 16 Body Mass Index (BMI): 19.4 Blood Pressure (mmHg): 92/61 Capillary Blood Glucose (mg/dl): 206 Reference Range: 80 - 120 mg / dl Notes glucose per pt report Electronic Signature(s) Signed: 07/14/2020 6:01:10 PM By: Levan Hurst RN, BSN Entered By: Levan Hurst on 07/14/2020 07:54:49

## 2020-07-14 NOTE — Patient Instructions (Signed)
Iron Deficiency Anemia, Adult Iron deficiency anemia is a condition in which the concentration of red blood cells or hemoglobin in the blood is below normal because of too little iron. Hemoglobin is a substance in red blood cells that carries oxygen to the body's tissues. When the concentration of red blood cells or hemoglobin is too low, not enough oxygen reaches these tissues. Iron deficiency anemia is usually long-lasting, and it develops over time. It may or may not cause symptoms. It is a common type of anemia. What are the causes? This condition may be caused by: Not enough iron in the diet. Abnormal absorption in the gut. Increased need for iron because of pregnancy or heavy menstrual periods, for females. Cancers of the gastrointestinal system, such as colon cancer. Blood loss caused by bleeding in the intestine. This may be from a gastrointestinal condition like Crohn's disease. Frequent blood draws, such as from blood donation. What increases the risk? The following factors may make you more likely to develop this condition: Being pregnant. Being a teenage girl going through a growth spurt. What are the signs or symptoms? Symptoms of this condition may include: Pale skin, lips, and nail beds. Weakness, dizziness, and getting tired easily. Headache. Shortness of breath when moving or exercising. Cold hands and feet. Fast or irregular heartbeat. Irritability or rapid breathing. These are more common in severe anemia. Mild anemia may not cause any symptoms. How is this diagnosed? This condition is diagnosed based on: Your medical history. A physical exam. Blood tests. You may have additional tests to find the underlying cause of your anemia, such as: Testing for blood in the stool (fecal occult blood test). A procedure to see inside your colon and rectum (colonoscopy). A procedure to see inside your esophagus and stomach (endoscopy). A test in which cells are removed from  bone marrow (bone marrow aspiration) or fluid is removed from the bone marrow to be examined. This is rarely needed. How is this treated? This condition is treated by correcting the cause of your iron deficiency. Treatment may involve: Adding iron-rich foods to your diet. Taking iron supplements. If you are pregnant or breastfeeding, you may need to take extra iron because your normal diet usually does not provide the amount of iron that you need. Increasing vitamin C intake. Vitamin C helps your body absorb iron. Your health care provider may recommend that you take iron supplements along with a glass of orange juice or a vitamin C supplement. Medicines to make heavy menstrual flow lighter. Surgery. You may need repeat blood tests to determine whether treatment is working. If the treatment does not seem to be working, you may need more tests. Follow these instructions at home: Medicines Take over-the-counter and prescription medicines only as told by your health care provider. This includes iron supplements and vitamins. For the best iron absorption, you should take iron supplements when your stomach is empty. If you cannot tolerate them on an empty stomach, you may need to take them with food. Do not drink milk or take antacids at the same time as your iron supplements. Milk and antacids may interfere with iron absorption. Iron supplements may turn stool (feces) a darker color and it may appear black. If you cannot tolerate taking iron supplements by mouth, talk with your health care provider about taking them through an IV or through an injection into a muscle. Eating and drinking  Talk with your health care provider before changing your diet. He or she may recommend   that you eat foods that contain a lot of iron, such as: Liver. Low-fat (lean) beef. Breads and cereals that have iron added to them (are fortified). Eggs. Dried fruit. Dark green, leafy vegetables. To help your body use the  iron from iron-rich foods, eat those foods at the same time as fresh fruits and vegetables that are high in vitamin C. Foods that are high in vitamin C include: Oranges. Peppers. Tomatoes. Mangoes. Drink enough fluid to keep your urine pale yellow. Managing constipation If you are taking an iron supplement, it may cause constipation. To prevent or treat constipation, you may need to: Take over-the-counter or prescription medicines. Eat foods that are high in fiber, such as beans, whole grains, and fresh fruits and vegetables. Limit foods that are high in fat and processed sugars, such as fried or sweet foods. General instructions Return to your normal activities as told by your health care provider. Ask your health care provider what activities are safe for you. Practice good hygiene. Anemia can make you more prone to illness and infection. Keep all follow-up visits as told by your health care provider. This is important. Contact a health care provider if you: Feel nauseous or you vomit. Feel weak. Have unexplained sweating. Develop symptoms of constipation, such as: Having fewer than three bowel movements a week. Straining to have a bowel movement. Having stools that are hard, dry, or larger than normal. Feeling full or bloated. Pain in the lower abdomen. Not feeling relief after having a bowel movement. Get help right away if you: Faint. If this happens, do not drive yourself to the hospital. Have chest pain. Have shortness of breath that: Is severe. Gets worse with physical activity. Have an irregular or rapid heartbeat. Become light-headed when getting up from a sitting or lying down position. These symptoms may represent a serious problem that is an emergency. Do not wait to see if the symptoms will go away. Get medical help right away. Call your local emergency services (911 in the [place] Do not drive yourself to the hospital. Summary Iron deficiency anemia is a condition in  which the concentration of red blood cells or hemoglobin in the blood is below normal because of too little iron. This condition is treated by correcting the cause of your iron deficiency. Take over-the-counter and prescription medicines only as told by your health care provider. This includes iron supplements and vitamins. To help your body use the iron from iron-rich foods, eat those foods at the same time as fresh fruits and vegetables that are high in vitamin C. Get help right away if you have shortness of breath that gets worse with physical activity. This information is not intended to replace advice given to you by your health care provider. Make sure you discuss any questions you have with your health care provider. Document Revised: 08/26/2018 Document Reviewed: 08/26/2018 [employer] Patient Education  2022 [employer]  

## 2020-07-14 NOTE — Progress Notes (Signed)
Subjective:  Patient ID: [name] [name], male    DOB: 1955/10/02  Age: 65 y.o. MRN: [id number]  CC: Anemia and Diabetes  This visit occurred during the SARS-CoV-2 public health emergency.  Safety protocols were in place, including screening questions prior to the visit, additional usage of staff PPE, and extensive cleaning of exam room while observing appropriate contact time as indicated for disinfecting solutions.    HPI [name] [name] presents for f/up -   He has not been monitoring his blood sugar or using his insulin very consistently.  He is with his sister today.  She says he lives alone.  He is being treated for ulcers on his upper and lower extremity.  Outpatient Medications Prior to Visit  Medication Sig Dispense Refill   aspirin EC 325 MG tablet Take 325 mg by mouth daily.     atorvastatin (LIPITOR) 40 MG tablet Take 1 tablet (40 mg total) by mouth daily. 90 tablet 1   B Complex Vitamins (B COMPLEX-B12 PO) Take 1 tablet by mouth daily.      cephALEXin (KEFLEX) 500 MG capsule Take 1 capsule (500 mg total) by mouth 3 (three) times daily for 10 days. 30 capsule 0   Cholecalciferol (VITAMIN D) 125 MCG (5000 UT) CAPS Take 5,000 Units by mouth daily.      Continuous Blood Gluc Receiver (FREESTYLE LIBRE 14 DAY READER) DEVI 1 each by Does not apply route daily. 2 each 5   Continuous Blood Gluc Sensor (FREESTYLE LIBRE 14 DAY SENSOR) MISC 1 each by Does not apply route daily. 2 each 5   Insulin Aspart FlexPen 100 UNIT/ML SOPN Inject 10-20 Units into the skin See admin instructions. Injects10-20 units into the skin three times daily per sliding scale. (Patient taking differently: Inject 0-10 Units into the skin See admin instructions. Injects 0-10 units into the skin three times daily per sliding scale.) 9 mL 1   insulin glargine (LANTUS) 100 UNIT/ML injection Inject 0.5 mLs (50 Units total) into the skin at bedtime. 10 mL 3   Insulin Pen Needle (DROPLET PEN NEEDLES) 32G X 4 MM  MISC Use daily for glucose control 100 each 5   lisinopril (ZESTRIL) 5 MG tablet TAKE 1 TABLET BY MOUTH DAILY (Patient taking differently: Take 5 mg by mouth daily.) 90 tablet 1   mirtazapine (REMERON) 15 MG tablet Take 1 tablet (15 mg total) by mouth at bedtime. 90 tablet 1   PARoxetine (PAXIL) 20 MG tablet Take 1 tablet (20 mg total) by mouth daily. 90 tablet 1   SANTYL ointment Apply 1 application topically daily.     traZODone (DESYREL) 50 MG tablet Take 1 tablet (50 mg total) by mouth at bedtime. 90 tablet 1   No facility-administered medications prior to visit.    ROS Review of Systems  Constitutional:  Positive for unexpected weight change (wt loss).  HENT: Negative.    Respiratory:  Negative for cough, shortness of breath and wheezing.   Cardiovascular:  Negative for chest pain, palpitations and leg swelling.  Gastrointestinal:  Negative for abdominal pain, blood in stool, diarrhea, nausea and vomiting.  Endocrine: Positive for polydipsia and polyuria. Negative for polyphagia.  Genitourinary:  Positive for frequency. Negative for difficulty urinating and dysuria.  Musculoskeletal:  Negative for back pain and myalgias.  Skin:  Positive for pallor and wound.  Neurological: Negative.  Negative for dizziness.  Hematological:  Negative for adenopathy. Does not bruise/bleed easily.  Psychiatric/Behavioral: Negative.     Objective:  BP 114/66 (BP Location: Right Arm, Patient Position: Sitting, Cuff Size: Large)   Pulse 74   Temp 98.3 F (36.8 C) (Oral)   Resp 16   Ht 6\' 2"  (1.88 m)   Wt 148 lb (67.1 kg)   SpO2 96%   BMI 19.00 kg/m   BP Readings from Last 3 Encounters:  07/14/20 114/66  07/05/20 132/90  06/08/20 128/67    Wt Readings from Last 3 Encounters:  07/14/20 148 lb (67.1 kg)  07/06/20 150 lb (68 kg)  07/05/20 150 lb 12.8 oz (68.4 kg)    Physical Exam Vitals reviewed.  Constitutional:      Appearance: He is ill-appearing (thin, frail, pale, in a  wheelchair).  HENT:     Mouth/Throat:     Mouth: Mucous membranes are moist.  Eyes:     Conjunctiva/sclera: Conjunctivae normal.  Cardiovascular:     Rate and Rhythm: Normal rate and regular rhythm.     Heart sounds: No murmur heard. Pulmonary:     Effort: Pulmonary effort is normal.     Breath sounds: No stridor. No wheezing, rhonchi or rales.  Abdominal:     General: Abdomen is flat.     Palpations: There is no mass.     Tenderness: There is no abdominal tenderness.  Musculoskeletal:        General: Normal range of motion.     Cervical back: Neck supple.     Right lower leg: No edema.     Left lower leg: No edema.  Skin:    General: Skin is warm.     Coloration: Skin is pale.  Neurological:     General: No focal deficit present.     Mental Status: He is alert.  Psychiatric:        Mood and Affect: Mood normal.        Behavior: Behavior normal.    Lab Results  Component Value Date   WBC 6.7 07/14/2020   HGB 12.7 (L) 07/14/2020   HCT 37.7 (L) 07/14/2020   PLT 244.0 07/14/2020   GLUCOSE 440 (H) 07/14/2020   CHOL 123 03/16/2020   TRIG 222.0 (H) 03/16/2020   HDL 38.60 (L) 03/16/2020   LDLDIRECT 37.0 03/16/2020   LDLCALC 53 01/06/2019   ALT 12 05/18/2020   AST 14 (L) 05/18/2020   NA 131 (L) 07/14/2020   K 4.3 07/14/2020   CL 94 (L) 07/14/2020   CREATININE 0.65 07/14/2020   BUN 19 07/14/2020   CO2 27 07/14/2020   TSH 2.845 05/16/2020   PSA 0.24 03/16/2020   HGBA1C 12.0 (H) 07/14/2020   MICROALBUR 40.8 (H) 03/16/2020    DG Pelvis 1-2 Views  Result Date: 05/16/2020 CLINICAL DATA:  Left hip pain after fall today. EXAM: PELVIS - 1-2 VIEW COMPARISON:  None. FINDINGS: There is no evidence of pelvic fracture or diastasis. No pelvic bone lesions are seen. IMPRESSION: Negative. Electronically Signed   By: Marijo Conception M.D.   On: 05/16/2020 11:30   CT Head Wo Contrast  Result Date: 05/16/2020 CLINICAL DATA:  Fall down stairs EXAM: CT HEAD WITHOUT CONTRAST  TECHNIQUE: Contiguous axial images were obtained from the base of the skull through the vertex without intravenous contrast. COMPARISON:  None. FINDINGS: Brain: No acute intracranial abnormality. Specifically, no hemorrhage, hydrocephalus, mass lesion, acute infarction, or significant intracranial injury. Vascular: No hyperdense vessel or unexpected calcification. Skull: No acute calvarial abnormality. Sinuses/Orbits: No acute findings Other: None IMPRESSION: No acute intracranial abnormality. Electronically Signed  By: Rolm Baptise M.D.   On: 05/16/2020 11:26   CT Chest Wo Contrast  Result Date: 05/16/2020 CLINICAL DATA:  Left chest and scapular pain after fall down stairs EXAM: CT CHEST WITHOUT CONTRAST TECHNIQUE: Multidetector CT imaging of the chest was performed following the standard protocol without IV contrast. COMPARISON:  Chest x-ray 12/07/2019 FINDINGS: Cardiovascular: Heart size is normal. No pericardial effusion. Thoracic aorta is nonaneurysmal. There are atherosclerotic calcifications of the aorta and coronary arteries. Patient is status post CABG. Mediastinum/Nodes: No axillary, mediastinal, or hilar lymphadenopathy evident on non contrasted exam. Thyroid, trachea, and esophagus appear within normal limits. Lungs/Pleura: Moderate bilateral pleural effusions with associated compressive atelectasis. No pneumothorax. Upper Abdomen: No acute abnormality. Musculoskeletal: Acute mildly comminuted and relatively nondisplaced fracture of the left scapular spine. Acute fracture of the superior border of the left coracoid process which is superiorly displaced by approximately 11 mm (series 6, image 168). Acromioclavicular and glenohumeral joint alignment appears maintained without dislocation. No acute rib fracture. Thoracic vertebral body heights and alignment are maintained without evidence of fracture. Soft tissue edema and small amount of hemorrhage at the fracture sites. No well-defined hematoma.  IMPRESSION: 1. Acute fractures of the left scapula including a mildly comminuted and relatively nondisplaced fracture of the left scapular spine. Acute fracture of the superior border of the coracoid process which is superiorly displaced by approximately 11 mm. 2. Negative for rib fracture. 3. Moderate bilateral pleural effusions with associated compressive atelectasis. No pneumothorax. Aortic Atherosclerosis (ICD10-I70.0). Electronically Signed   By: Davina Poke D.O.   On: 05/16/2020 11:59   CT Cervical Spine Wo Contrast  Result Date: 05/16/2020 CLINICAL DATA:  Fall down stairs. EXAM: CT CERVICAL SPINE WITHOUT CONTRAST TECHNIQUE: Multidetector CT imaging of the cervical spine was performed without intravenous contrast. Multiplanar CT image reconstructions were also generated. COMPARISON:  None. FINDINGS: Alignment: Normal Skull base and vertebrae: No acute fracture. No primary bone lesion or focal pathologic process. Soft tissues and spinal canal: No prevertebral fluid or swelling. No visible canal hematoma. Disc levels: Degenerative disc disease at C5-6 and C6-7 with disc space narrowing and spurring. Bilateral degenerative facet disease, left greater than right. Upper chest: Large bilateral pleural effusions partially imaged. Other: None IMPRESSION: Cervical spondylosis.  No acute bony abnormality. Large bilateral pleural effusions. Electronically Signed   By: Rolm Baptise M.D.   On: 05/16/2020 11:27   CT T-SPINE NO CHARGE  Result Date: 05/16/2020 CLINICAL DATA:  Fall down stairs. EXAM: CT THORACIC SPINE WITHOUT CONTRAST TECHNIQUE: Multidetector CT images of the thoracic were obtained using the standard protocol without intravenous contrast. COMPARISON:  None. FINDINGS: Alignment: Normal. Vertebrae: No definite acute fracture is identified. Curvilinear lucency in the left T3 transverse process appears partially corticated and is favored to reflect a vascular channel rather than nondisplaced fracture  with less conspicuous suspected vascular channels and/or artifact seen in other transverse processes including on the right at T3. Paraspinal and other soft tissues: No acute abnormality identified in the paraspinal soft tissues. Intrathoracic contents reported separately. Disc levels: Mild thoracic spondylosis without evidence of a high-grade stenosis. IMPRESSION: No acute osseous abnormality identified in the thoracic spine. Electronically Signed   By: Logan Bores M.D.   On: 05/16/2020 11:40   DG Shoulder Left  Result Date: 05/16/2020 CLINICAL DATA:  Fall down stairs.  Left shoulder pain. EXAM: LEFT SHOULDER - 2+ VIEW COMPARISON:  None. FINDINGS: There is a fracture through the left scapula at the base of the acromion, minimal displacement.  Degenerative changes in the [facility] joint. Visualized humerus unremarkable. No dislocation. IMPRESSION: Scapular fracture at the base of the left acromion. Electronically Signed   By: Rolm Baptise M.D.   On: 05/16/2020 11:25   DG Knee Complete 4 Views Left  Result Date: 05/16/2020 CLINICAL DATA:  Left knee swelling after fall today. EXAM: LEFT KNEE - COMPLETE 4+ VIEW COMPARISON:  None. FINDINGS: No evidence of fracture, dislocation, or joint effusion. No evidence of arthropathy or other focal bone abnormality. Soft tissues are unremarkable. IMPRESSION: Negative. Electronically Signed   By: Marijo Conception M.D.   On: 05/16/2020 11:29   DG Humerus Left  Result Date: 05/16/2020 CLINICAL DATA:  Left shoulder pain after fall down stairs today. EXAM: LEFT HUMERUS - 2+ VIEW COMPARISON:  None. FINDINGS: Mildly displaced fracture is seen involving the left acromion. Visualized humerus is unremarkable. IMPRESSION: Mildly displaced left acromial fracture. Electronically Signed   By: Marijo Conception M.D.   On: 05/16/2020 11:28   ECHOCARDIOGRAM COMPLETE BUBBLE STUDY  Result Date: 05/17/2020    ECHOCARDIOGRAM REPORT   Patient Name:   BENNEY SOMMERVILLE Date of Exam: 05/17/2020  Medical Rec #:  [id number]            Height:       74.0 in Accession #:    [id number]           Weight:       175.0 lb Date of Birth:  21-Mar-1955             BSA:          2.054 m Patient Age:    68 years             BP:           107/72 mmHg Patient Gender: M                    HR:           72 bpm. Exam Location:  Inpatient Procedure: 2D Echo, Cardiac Doppler, Color Doppler and Saline Contrast Bubble            Study Indications:    R06.02 SOB  History:        Patient no has prior history of Echocardiogram examinations.                 CAD, Signs/Symptoms:Shortness of Breath; Risk                 Factors:Hypertension, Dyslipidemia and Diabetes.  Sonographer:    Bernadene Person RDCS Referring Phys: [place]  Sonographer Comments: Pt unable to turn on left side. IMPRESSIONS  1. Left ventricular ejection fraction, by estimation, is 55 to 60%. The left ventricle has normal function. The left ventricle has no regional wall motion abnormalities. Left ventricular diastolic parameters are indeterminate.  2. Right ventricular systolic function is low normal. The right ventricular size is normal. There is mildly elevated pulmonary artery systolic pressure.  3. Moderate pleural effusion in the left lateral region.  4. The mitral valve is grossly normal. No evidence of mitral valve regurgitation.  5. The aortic valve is tricuspid. There is moderate thickening of the aortic valve. Aortic valve regurgitation is not visualized.  6. There is borderline dilatation of the aortic root, measuring 38 mm. There is borderline dilatation of the ascending aorta, measuring 39 mm. These are within normal limits for age and BSA.  7. Agitated saline contrast bubble  study was negative, with no evidence of any interatrial shunt. Comparison(s): No prior Echocardiogram. FINDINGS  Left Ventricle: Left ventricular ejection fraction, by estimation, is 55 to 60%. The left ventricle has normal function. The left ventricle has no regional wall  motion abnormalities. The left ventricular internal cavity size was normal in size. There is  no concentric left ventricular hypertrophy of the basal-septal segment. Left ventricular diastolic parameters are indeterminate. Right Ventricle: The right ventricular size is normal. No increase in right ventricular wall thickness. Right ventricular systolic function is low normal. There is mildly elevated pulmonary artery systolic pressure. The tricuspid regurgitant velocity is 3.13 m/s, and with an assumed right atrial pressure of 3 mmHg, the estimated right ventricular systolic pressure is 93.8 mmHg. Left Atrium: Left atrial size was normal in size. Right Atrium: Right atrial size was normal in size. Pericardium: There is no evidence of pericardial effusion. Mitral Valve: The mitral valve is grossly normal. No evidence of mitral valve regurgitation. Tricuspid Valve: The tricuspid valve is not well visualized. Tricuspid valve regurgitation is not demonstrated. Aortic Valve: The aortic valve is tricuspid. There is moderate thickening of the aortic valve. Aortic valve regurgitation is not visualized. Pulmonic Valve: Discrepancy betweeen color and Spectral Doppler. The pulmonic valve was normal in structure. Pulmonic valve regurgitation is not visualized. No evidence of pulmonic stenosis. Aorta: Aortic dilatation noted. There is borderline dilatation of the aortic root, measuring 38 mm. There is borderline dilatation of the ascending aorta, measuring 39 mm. IAS/Shunts: The atrial septum is grossly normal. Agitated saline contrast was given intravenously to evaluate for intracardiac shunting. Agitated saline contrast bubble study was negative, with no evidence of any interatrial shunt. Additional Comments: There is a moderate pleural effusion in the left lateral region.  LEFT VENTRICLE PLAX 2D LVIDd:         4.20 cm LVIDs:         2.40 cm LV PW:         2.50 cm LV IVS:        1.10 cm LVOT diam:     2.10 cm LV SV:         56  LV SV Index:   27 LVOT Area:     3.46 cm  RIGHT VENTRICLE TAPSE (M-mode): 1.0 cm LEFT ATRIUM           Index       RIGHT ATRIUM           Index LA diam:      4.00 cm 1.95 cm/m  RA Area:     13.20 cm LA Vol (A2C): 35.5 ml 17.29 ml/m RA Volume:   36.20 ml  17.63 ml/m LA Vol (A4C): 39.8 ml 19.38 ml/m  AORTIC VALVE LVOT Vmax:   67.20 cm/s LVOT Vmean:  55.700 cm/s LVOT VTI:    0.163 m  AORTA Ao Root diam: 3.80 cm Ao Asc diam:  3.90 cm TRICUSPID VALVE TR Peak grad:   39.2 mmHg TR Vmax:        313.00 cm/s  SHUNTS Systemic VTI:  0.16 m Systemic Diam: 2.10 cm Rudean Haskell MD Electronically signed by Rudean Haskell MD Signature Date/Time: 05/17/2020/4:21:06 PM    Final    VAS [place] LOWER EXTREMITY VENOUS (DVT)  Result Date: 05/17/2020  Lower Venous DVT Study Patient Name:  JERAULD BOSTWICK  Date of Exam:   05/17/2020 Medical Rec #: [id number]             Accession #:  [id number] Date of Birth: October 10, 1955              Patient Gender: M Patient Age:   65Y Exam Location:  [facility] Procedure:      VAS [place] LOWER EXTREMITY VENOUS (DVT) Referring Phys: [id number] EKTA V PATEL --------------------------------------------------------------------------------  Indications: Edema.  Anticoagulation: Recently d/c Eliquis. Comparison Study: No prior venous studies. Performing Technologist: Darlin Coco RDMS,RVT  Examination Guidelines: A complete evaluation includes B-mode imaging, spectral Doppler, color Doppler, and power Doppler as needed of all accessible portions of each vessel. Bilateral testing is considered an integral part of a complete examination. Limited examinations for reoccurring indications may be performed as noted. The reflux portion of the exam is performed with the patient in reverse Trendelenburg.  +---------+---------------+---------+-----------+----------+--------------+ RIGHT    CompressibilityPhasicitySpontaneityPropertiesThrombus Aging  +---------+---------------+---------+-----------+----------+--------------+ CFV      Full           Yes      Yes                                 +---------+---------------+---------+-----------+----------+--------------+ SFJ      Full                                                        +---------+---------------+---------+-----------+----------+--------------+ FV Prox  Full                                                        +---------+---------------+---------+-----------+----------+--------------+ FV Mid   Full                                                        +---------+---------------+---------+-----------+----------+--------------+ FV DistalFull                                                        +---------+---------------+---------+-----------+----------+--------------+ PFV      Full                                                        +---------+---------------+---------+-----------+----------+--------------+ POP      Full           Yes      Yes                                 +---------+---------------+---------+-----------+----------+--------------+ PTV      Full                                                        +---------+---------------+---------+-----------+----------+--------------+  PERO     Full                                                        +---------+---------------+---------+-----------+----------+--------------+   +---------+---------------+---------+-----------+----------+--------------+ LEFT     CompressibilityPhasicitySpontaneityPropertiesThrombus Aging +---------+---------------+---------+-----------+----------+--------------+ CFV      Full           Yes      Yes                                 +---------+---------------+---------+-----------+----------+--------------+ SFJ      Full                                                         +---------+---------------+---------+-----------+----------+--------------+ FV Prox  Full                                                        +---------+---------------+---------+-----------+----------+--------------+ FV Mid   Full                                                        +---------+---------------+---------+-----------+----------+--------------+ FV DistalFull                                                        +---------+---------------+---------+-----------+----------+--------------+ PFV      Full                                                        +---------+---------------+---------+-----------+----------+--------------+ POP      Full           Yes      Yes                                 +---------+---------------+---------+-----------+----------+--------------+ PTV      Full                                                        +---------+---------------+---------+-----------+----------+--------------+ PERO     Full                                                        +---------+---------------+---------+-----------+----------+--------------+  Summary: RIGHT: - There is no evidence of deep vein thrombosis in the lower extremity.  - No cystic structure found in the popliteal fossa.  LEFT: - There is no evidence of deep vein thrombosis in the lower extremity.  - No cystic structure found in the popliteal fossa.  *See table(s) above for measurements and observations. Electronically signed by Deitra Mayo MD on 05/17/2020 at 1:07:23 PM.    Final     Assessment & Plan:   [name] [name] was seen today for anemia and diabetes.  Diagnoses and all orders for this visit:  Iron deficiency anemia secondary to inadequate dietary iron intake- His H&H have improved but he still mildly anemic.  His iron level is normal.  His ferritin level is high so he likely has the anemia of chronic disease. -     CBC with Differential/Platelet;  Future -     Iron; Future -     Ferritin; Future -     Ferritin -     Iron -     CBC with Differential/Platelet  Type 1 diabetes mellitus with diabetic foot infection ([place]) -     Basic metabolic panel; Future -     Hemoglobin A1c; Future -     Ambulatory referral to Ophthalmology -     Hemoglobin A1c -     Basic metabolic panel  Non-proliferative diabetic retinopathy (HCC) -     Basic metabolic panel; Future -     Hemoglobin A1c; Future -     Ambulatory referral to Ophthalmology -     Hemoglobin A1c -     Basic metabolic panel  Uncontrolled type 2 diabetes mellitus with hyperglycemia ([place])- His A1c remains too high.  I recommended that he be more compliant with his insulin regimen. -     Basic metabolic panel; Future -     Hemoglobin A1c; Future -     Ambulatory referral to Ophthalmology -     Hemoglobin A1c -     Basic metabolic panel  I am having [name] [name] maintain his Vitamin D, B Complex Vitamins (B COMPLEX-B12 PO), lisinopril, atorvastatin, PARoxetine, traZODone, mirtazapine, FreeStyle Libre 14 Day Reader, FreeStyle Libre 14 Day Sensor, Droplet Pen Needles, insulin glargine, Insulin Aspart FlexPen, aspirin EC, Santyl, and cephALEXin.  No orders of the defined types were placed in this encounter.    Follow-up: Return in about 3 months (around 10/14/2020).  [name], MD

## 2020-07-15 ENCOUNTER — Ambulatory Visit (INDEPENDENT_AMBULATORY_CARE_PROVIDER_SITE_OTHER): Payer: HMO | Admitting: Endocrinology

## 2020-07-15 ENCOUNTER — Encounter: Payer: Self-pay | Admitting: Endocrinology

## 2020-07-15 VITALS — BP 112/78 | HR 83 | Ht 74.0 in | Wt 148.0 lb

## 2020-07-15 DIAGNOSIS — L089 Local infection of the skin and subcutaneous tissue, unspecified: Secondary | ICD-10-CM

## 2020-07-15 DIAGNOSIS — E10628 Type 1 diabetes mellitus with other skin complications: Secondary | ICD-10-CM

## 2020-07-15 DIAGNOSIS — E1042 Type 1 diabetes mellitus with diabetic polyneuropathy: Secondary | ICD-10-CM

## 2020-07-15 LAB — CBC WITH DIFFERENTIAL/PLATELET
Basophils Absolute: 0 10*3/uL (ref 0.0–0.1)
Basophils Relative: 0.7 % (ref 0.0–3.0)
Eosinophils Absolute: 0 10*3/uL (ref 0.0–0.7)
Eosinophils Relative: 0.7 % (ref 0.0–5.0)
HCT: 37.7 % — ABNORMAL LOW (ref 39.0–52.0)
Hemoglobin: 12.7 g/dL — ABNORMAL LOW (ref 13.0–17.0)
Lymphocytes Relative: 19.7 % (ref 12.0–46.0)
Lymphs Abs: 1.3 10*3/uL (ref 0.7–4.0)
MCHC: 33.7 g/dL (ref 30.0–36.0)
MCV: 92 fl (ref 78.0–100.0)
Monocytes Absolute: 0.4 10*3/uL (ref 0.1–1.0)
Monocytes Relative: 6.1 % (ref 3.0–12.0)
Neutro Abs: 4.9 10*3/uL (ref 1.4–7.7)
Neutrophils Relative %: 72.8 % (ref 43.0–77.0)
Platelets: 244 10*3/uL (ref 150.0–400.0)
RBC: 4.09 Mil/uL — ABNORMAL LOW (ref 4.22–5.81)
RDW: 15.8 % — ABNORMAL HIGH (ref 11.5–15.5)
WBC: 6.7 10*3/uL (ref 4.0–10.5)

## 2020-07-15 MED ORDER — FREESTYLE LIBRE 14 DAY SENSOR MISC: 1.0000 | 3 refills | Status: DC

## 2020-07-15 MED ORDER — LANTUS SOLOSTAR 100 UNIT/ML ~~LOC~~ SOPN: 15.0000 [IU] | PEN_INJECTOR | SUBCUTANEOUS | 99 refills | Status: DC

## 2020-07-15 MED ORDER — INSULIN ASPART FLEXPEN 100 UNIT/ML ~~LOC~~ SOPN: 8.0000 [IU] | PEN_INJECTOR | Freq: Two times a day (BID) | SUBCUTANEOUS | 3 refills | Status: DC

## 2020-07-15 NOTE — Patient Instructions (Addendum)
good diet and exercise significantly improve the control of your diabetes.  please let me know if you wish to be referred to a dietician.  high blood sugar is very risky to your health.  you should see an eye doctor and dentist every year.  It is very important to get all recommended vaccinations.  Controlling your blood pressure and cholesterol drastically reduces the damage diabetes does to your body.  Those who smoke should quit.  Please discuss these with your doctor.  check your blood sugar twice a day.  vary the time of day when you check, between before the 3 meals, and at bedtime.  also check if you have symptoms of your blood sugar being too high or too low.  please keep a record of the readings and bring it to your next appointment here (or you can bring the meter itself).  You can write it on any piece of paper.  please call us sooner if your blood sugar goes below 70, or if most of your readings are over 200. We will need to take this complex situation in stages I have sent a prescription to your pharmacy, for the continuous glucose monitor sensors. For now, please change the 2 insulins to the numbers listed below. Please come back for a follow-up appointment in 2 weeks.  Please see the educator the same day, to consider a pump.

## 2020-07-15 NOTE — Progress Notes (Signed)
Subjective:    Patient ID: [name] [name], male    DOB: [birth year], 65 y.o.   MRN: [id number]  HPI Sister provides some hx, due to pt's poor health; pt is referred by Dr Ronnald Ramp, for diabetes.  Pt states DM was dx'ed in 5701; it is complicated by CAD, PN, PAD, foot ulcer, and DR; he has been on insulin since 2013; pt says his diet and exercise are limited by health problems; he has never had pancreatitis, pancreatic surgery, severe hypoglycemia or DKA.  He has lost a few lbs--unintentional.  He says Lantus is 10 units QHS, and Novolog (he takes PRN SS--averages 6-8 units 3 times a day (just before each meal)).  He does not use continuous glucose monitor.  He has hypoglycemia once every few mos.  He says cbg varies from 90-300.  It is in general higher as the day goes on. Pt says he misses the lunch Novolog approx QOD.  Past Medical History:  Diagnosis Date   Anxiety    Arthritis    Constipation    pt states goes EOD- hard stools    Coronary artery disease    Depression    Diabetes mellitus without complication ([place])    DKA (diabetic ketoacidoses)    Hyperlipidemia    Hypertension    off meds   Psoriasis     Past Surgical History:  Procedure Laterality Date   ANKLE SURGERY     right   CARDIAC CATHETERIZATION  09/25/2001   COLONOSCOPY     ~10 yr ago- normal  per pt    CORONARY ARTERY BYPASS GRAFT  09/30/2001   CABG x 4   IR THORACENTESIS ASP PLEURAL SPACE W/IMG GUIDE  05/19/2020   LAPAROSCOPIC INGUINAL HERNIA REPAIR Bilateral 02/09/2010   MASS EXCISION Left 03/12/2013   Procedure: LEFT INDEX EXCISION MASS AND DEBRIDMENT DISTAL INTERPHALANGEAL JOINT;  Surgeon: Tennis Must, MD;  Location: [facility];  Service: Orthopedics;  Laterality: Left;    Social History   Socioeconomic History   Marital status: Divorced    Spouse name: Not on file   Number of children: 4   Years of education: 12   Highest education level: 12th grade  Occupational History   Occupation:  Retired  Tobacco Use   Smoking status: Some Days    Types: Cigars   Smokeless tobacco: Never  Vaping Use   Vaping Use: Never used  Substance and Sexual Activity   Alcohol use: Yes    Comment: daily beer or scotch    Drug use: No   Sexual activity: Yes  Other Topics Concern   Not on file  Social History Narrative   Lives with his adult son [name].   Has 4 children, 2 sons- both live in [place], 2 daughters one lives in [place], [place] and youngest daughter attends [employer].    Fiance lives in  [place], [place]   Social Determinants of Health   Financial Resource Strain: Unknown   Difficulty of Paying Living Expenses: Patient refused  Food Insecurity: No Food Insecurity   Worried About Charity fundraiser in the Last Year: Never true   Ran Out of Food in the Last Year: Never true  Transportation Needs: No Transportation Needs   Lack of Transportation (Medical): No   Lack of Transportation (Non-Medical): No  Physical Activity: Inactive   Days of Exercise per Week: 0 days   Minutes of Exercise per Session: 0 min  Stress: Stress Concern Present  Feeling of Stress : Very much  Social Connections: Socially Isolated   Frequency of Communication with Friends and Family: More than three times a week   Frequency of Social Gatherings with Friends and Family: More than three times a week   Attends Religious Services: Never   Marine scientist or Organizations: No   Attends Music therapist: Never   Marital Status: Divorced  Human resources officer Violence: Not At Risk   Fear of Current or Ex-Partner: No   Emotionally Abused: No   Physically Abused: No   Sexually Abused: No    Current Outpatient Medications on File Prior to Visit  Medication Sig Dispense Refill   aspirin EC 325 MG tablet Take 325 mg by mouth daily.     atorvastatin (LIPITOR) 40 MG tablet Take 1 tablet (40 mg total) by mouth daily. 90 tablet 1   B Complex Vitamins (B COMPLEX-B12 PO)  Take 1 tablet by mouth daily.      Cholecalciferol (VITAMIN D) 125 MCG (5000 UT) CAPS Take 5,000 Units by mouth daily.      Continuous Blood Gluc Receiver (FREESTYLE LIBRE 14 DAY READER) DEVI 1 each by Does not apply route daily. 2 each 5   Insulin Pen Needle (DROPLET PEN NEEDLES) 32G X 4 MM MISC Use daily for glucose control 100 each 5   lisinopril (ZESTRIL) 5 MG tablet TAKE 1 TABLET BY MOUTH DAILY (Patient taking differently: Take 5 mg by mouth daily.) 90 tablet 1   mirtazapine (REMERON) 15 MG tablet Take 1 tablet (15 mg total) by mouth at bedtime. 90 tablet 1   PARoxetine (PAXIL) 20 MG tablet Take 1 tablet (20 mg total) by mouth daily. 90 tablet 1   SANTYL ointment Apply 1 application topically daily.     traZODone (DESYREL) 50 MG tablet Take 1 tablet (50 mg total) by mouth at bedtime. 90 tablet 1   No current facility-administered medications on file prior to visit.    No Known Allergies  Family History  Problem Relation Age of Onset   Lymphoma Father    Cancer Paternal Grandmother        Colon Cancer   Colon cancer Paternal Grandmother    Colon polyps Neg Hx    Esophageal cancer Neg Hx    Rectal cancer Neg Hx    Stomach cancer Neg Hx    Diabetes Mellitus I Neg Hx     BP 112/78 (BP Location: Right Arm, Patient Position: Sitting, Cuff Size: Normal)   Pulse 83   Ht 6\' 2"  (1.88 m)   Wt 148 lb (67.1 kg)   SpO2 95%   BMI 19.00 kg/m    Review of Systems denies sob, n/v, memory loss.      Objective:   Physical Exam VITAL SIGNS:  See vs page GENERAL: no distress Feet: both are bandaged  Lab Results  Component Value Date   CREATININE 0.65 07/14/2020   BUN 19 07/14/2020   NA 131 (L) 07/14/2020   K 4.3 07/14/2020   CL 94 (L) 07/14/2020   CO2 27 07/14/2020   Lab Results  Component Value Date   HGBA1C 12.0 (H) 07/14/2020       Assessment & Plan:  Insulin-requiring type 2 DM: uncontrolled.  As he is missing doses, we'll reduce frequency of insulin injections.    Lean body habitus: he is at risk for evolving type 1.  Patient Instructions  good diet and exercise significantly improve the control of your diabetes.  please let me know if you wish to be referred to a dietician.  high blood sugar is very risky to your health.  you should see an eye doctor and dentist every year.  It is very important to get all recommended vaccinations.  Controlling your blood pressure and cholesterol drastically reduces the damage diabetes does to your body.  Those who smoke should quit.  Please discuss these with your doctor.  check your blood sugar twice a day.  vary the time of day when you check, between before the 3 meals, and at bedtime.  also check if you have symptoms of your blood sugar being too high or too low.  please keep a record of the readings and bring it to your next appointment here (or you can bring the meter itself).  You can write it on any piece of paper.  please call us sooner if your blood sugar goes below 70, or if most of your readings are over 200. We will need to take this complex situation in stages I have sent a prescription to your pharmacy, for the continuous glucose monitor sensors. For now, please change the 2 insulins to the numbers listed below. Please come back for a follow-up appointment in 2 weeks.  Please see the educator the same day, to consider a pump.

## 2020-07-18 ENCOUNTER — Ambulatory Visit: Payer: HMO | Admitting: Orthopedic Surgery

## 2020-07-21 ENCOUNTER — Encounter (HOSPITAL_BASED_OUTPATIENT_CLINIC_OR_DEPARTMENT_OTHER): Payer: HMO | Admitting: Internal Medicine

## 2020-07-21 ENCOUNTER — Other Ambulatory Visit: Payer: Self-pay

## 2020-07-21 DIAGNOSIS — L97528 Non-pressure chronic ulcer of other part of left foot with other specified severity: Secondary | ICD-10-CM | POA: Diagnosis not present

## 2020-07-21 DIAGNOSIS — L97422 Non-pressure chronic ulcer of left heel and midfoot with fat layer exposed: Secondary | ICD-10-CM | POA: Diagnosis not present

## 2020-07-21 DIAGNOSIS — L97512 Non-pressure chronic ulcer of other part of right foot with fat layer exposed: Secondary | ICD-10-CM | POA: Diagnosis not present

## 2020-07-21 NOTE — Progress Notes (Signed)
[name] [name] ([id number]) Visit Report for 07/21/2020 HPI Details Patient Name: Date of Service: [name] [name] [facility] THA N L. 07/21/2020 8:00 A M Medical Record Number: [id number] Patient Account Number: [id number] Date of Birth/Sex: Treating RN: 1955-03-08 (65 y.o. [name] [name] Primary Care Provider: Scarlette [name] Other Clinician: Referring Provider: Treating Provider/Extender: [name] in Treatment: 2 History of Present Illness HPI Description: ADMISSION 07/07/2020 This is a 65 year old man who is a type II diabetic. I have reviewed this in epic. It appeared that his diabetes was diagnosed in 2009 at which time he was on metformin. He was followed for a period of time by an endocrinologist with [facility] to always labeled him as a type II diabetic. Recently in epic there is been a switch to type 1 diabetes. If there were defining antibody test done I do not see them and I think the totality of the evidence would suggest that he is a type II diabetic. His recent problem started in May when he fell down 17 stairs. He suffered a scapular fracture he was hospitalized at [facility] for 9 days and then discharged to [facility]. His mother who is present said that most of these wounds seem to develop surrounding the discharge from hospital or the stay at [facility]. He has an area on his lower sacrum which appears currently to be a stage II but per their description this is actually improved quite a bit. He has an area on the left lateral foot at roughly the base of the fifth metatarsal, the left Achilles heel and the right fifth metatarsal head laterally they have been using topical antibiotics. Seen by primary care yesterday and placed on Keflex The patient had arterial studies on 05/05/2020. I wondered whether this had something to do with wounds on his feet at that time but I have not been able to determine this. At that time his ABI on the right was 1.09 with a TBI of  0.65 on the left 0.88 with a TBI of 0.44. Waveforms were triphasic on the right monophasic on the left. He likely has significant PAD on the left Past medical history includes type 2 diabetes poorly controlled last hemoglobin A1c at 12.7 in May, hypertension, hyperlipidemia, atrial fib on Eliquis, coronary artery disease status post CABG, left scapular fracture in May of this year, AAA repair. Bilateral leg weakness ABIs were not done in our clinic as they were just done in May of this year at [facility]; patient arrives back in clinic he has areas on the left Achilles heel, base of the left fifth metatarsal, right lateral fifth metatarsal. The area he had over the coccyx appears to be healed but this area remains vulnerable. We have been using Iodoflex. I rereviewed his ABIs which were done in May. I do not know exactly what prompted these test however they do show a TBI of 0.44 on the left and monophasic waveforms. 7/21; the patient's wounds are about the same. Certainly no healing and the nurses noted some green drainage on the right fifth metatarsal head, left Achilles a little larger. We have been using silver alginate really without any improvement. We did get an appointment with vascular surgery however its that it did the end of August and they seem to want to repeat the noninvasive test that they have already done in May. I am not sure what the issue here is. We advised the patient's sister to call and point to  the idea that they have already had noninvasive studies. The real question I have is does this patient need an angiogram. He is a diabetic and it is possible that the ABIs are falsely elevated because of medial calcification. At least on the left his TBI was abnormal Electronic Signature(s) Signed: 07/21/2020 5:24:41 PM By: Linton Ham MD Entered By: Linton Ham on 07/21/2020  09:09:50 -------------------------------------------------------------------------------- Physical Exam Details Patient Name: Date of Service: [name] [name] [facility] THA N L. 07/21/2020 8:00 A M Medical Record Number: [id number] Patient Account Number: [id number] Date of Birth/Sex: Treating RN: 08/19/1955 (65 y.o. [name] [name] Primary Care Provider: Scarlette [name] Other Clinician: Referring Provider: Treating Provider/Extender: [name] in Treatment: 2 Constitutional Sitting or standing Blood Pressure is within target range for patient.. Pulse regular and within target range for patient.Marland Kitchen Respirations regular, non-labored and within target range.. Temperature is normal and within the target range for the patient.Marland Kitchen Appears in no distress. Notes Wound exam; on the left at the base of the fifth metatarsal head and necrotic circumference some debris on the surface no evidence of infection He also has the area on the Achilles which is more superficial some debris on the surface that I did not remove today On the right lateral foot at the lateral aspect of the fifth MTP this is clean I saw no evidence of infection in spite of the green drainage Electronic Signature(s) Signed: 07/21/2020 5:24:41 PM By: Linton Ham MD Entered By: Linton Ham on 07/21/2020 09:11:20 -------------------------------------------------------------------------------- Physician Orders Details Patient Name: Date of Service: [name] [name] [facility] THA N L. 07/21/2020 8:00 A M Medical Record Number: [id number] Patient Account Number: [id number] Date of Birth/Sex: Treating RN: January 07, 1955 (65 y.o. [name] [name], [name] [name] Primary Care Provider: Scarlette [name] Other Clinician: Referring Provider: Treating Provider/Extender: [name] in Treatment: 2 Verbal / Phone Orders: No Diagnosis Coding ICD-10 Coding Code Description E11.621 Type 2 diabetes mellitus with foot ulcer L97.528  Non-pressure chronic ulcer of other part of left foot with other specified severity L97.511 Non-pressure chronic ulcer of other part of right foot limited to breakdown of skin L89.622 Pressure ulcer of left heel, stage 2 E11.42 Type 2 diabetes mellitus with diabetic polyneuropathy Follow-up Appointments Return appointment in 1 month. - Dr. Dellia Nims 08/18/2020 ppointment in: - See Margarita Grizzle 08/08/2020 Return A Other: - [facility] V[facility] appt 08/29/2020. Bathing/ Shower/ Hygiene May shower and wash wound with soap and water. - with dressing changes only. Off-Loading Turn and reposition every 2 hours Other: - float heels while resting in bed or chair. Use a pillow or rolled sheet under legs to offload the heels. Home Health No change in wound care orders this week; continue Home Health for wound care. May utilize formulary equivalent dressing for wound treatment orders unless otherwise specified. - [facility] home health twice a week and family once a week. Wound Treatment Wound #1 - Calcaneus Wound Laterality: Left Cleanser: Soap and Water (Home Health) 3 x Per Week/30 Days Discharge Instructions: May shower and wash wound with dial antibacterial soap and water prior to dressing change. Cleanser: Wound Cleanser (Home Health) 3 x Per Week/30 Days Discharge Instructions: Cleanse the wound with wound cleanser prior to applying a clean dressing using gauze sponges, not tissue or cotton balls. Prim Dressing: Hydrofera Blue Classic Foam, 4x4 in (Home Health) 3 x Per Week/30 Days ary Discharge Instructions: Moisten with saline prior to applying to wound bed Secondary Dressing: Woven Gauze Sponge, Non-Sterile 4x4 in (Home  Health) 3 x Per Week/30 Days Discharge Instructions: Apply over primary dressing as directed. Secondary Dressing: ALLEVYN Heel 4 1/2in x 5 1/2in / 10.5cm x 13.5cm (Home Health) 3 x Per Week/30 Days Discharge Instructions: Apply over primary dressing as directed. Secured With: [employer], 4.5x3.1 (in/yd) (Home Health) 3 x Per Week/30 Days Discharge Instructions: Secure with Kerlix as directed. Secured With: 45M Medipore H Soft Cloth Surgical T 4 x 2 (in/yd) (Home Health) 3 x Per Week/30 Days ape Discharge Instructions: Secure dressing with tape as directed. Wound #2 - Foot Wound Laterality: Left, Lateral Cleanser: Soap and Water (Home Health) 3 x Per Week/30 Days Discharge Instructions: May shower and wash wound with dial antibacterial soap and water prior to dressing change. Cleanser: Wound Cleanser (Home Health) 3 x Per Week/30 Days Discharge Instructions: Cleanse the wound with wound cleanser prior to applying a clean dressing using gauze sponges, not tissue or cotton balls. Prim Dressing: Hydrofera Blue Classic Foam, 4x4 in (Home Health) 3 x Per Week/30 Days ary Discharge Instructions: Moisten with saline prior to applying to wound bed Secondary Dressing: Woven Gauze Sponge, Non-Sterile 4x4 in (Home Health) 3 x Per Week/30 Days Discharge Instructions: Apply over primary dressing as directed. Secured With: [employer], 4.5x3.1 (in/yd) (Home Health) 3 x Per Week/30 Days Discharge Instructions: Secure with Kerlix as directed. Secured With: 45M Medipore H Soft Cloth Surgical T 4 x 2 (in/yd) (Home Health) 3 x Per Week/30 Days ape Discharge Instructions: Secure dressing with tape as directed. Wound #3 - Foot Wound Laterality: Right, Lateral Cleanser: Soap and Water (Home Health) 3 x Per Week/30 Days Discharge Instructions: May shower and wash wound with dial antibacterial soap and water prior to dressing change. Cleanser: Wound Cleanser (Home Health) 3 x Per Week/30 Days Discharge Instructions: Cleanse the wound with wound cleanser prior to applying a clean dressing using gauze sponges, not tissue or cotton balls. Prim Dressing: Hydrofera Blue Classic Foam, 4x4 in (Home Health) 3 x Per Week/30 Days ary Discharge Instructions: Moisten with saline prior to  applying to wound bed Secondary Dressing: Woven Gauze Sponge, Non-Sterile 4x4 in (Home Health) 3 x Per Week/30 Days Discharge Instructions: Apply over primary dressing as directed. Secured With: [employer], 4.5x3.1 (in/yd) (Home Health) 3 x Per Week/30 Days Discharge Instructions: Secure with Kerlix as directed. Secured With: 45M Medipore H Soft Cloth Surgical T 4 x 2 (in/yd) (Home Health) 3 x Per Week/30 Days ape Discharge Instructions: Secure dressing with tape as directed. Electronic Signature(s) Signed: 07/21/2020 5:24:41 PM By: Linton Ham MD Signed: 07/21/2020 6:31:47 PM By: [name] Entered By: [name] on 07/21/2020 09:06:15 -------------------------------------------------------------------------------- Problem List Details Patient Name: Date of Service: [name] [name] [facility] THA N L. 07/21/2020 8:00 A M Medical Record Number: [id number] Patient Account Number: [id number] Date of Birth/Sex: Treating RN: 1955/09/29 (65 y.o. [name] [name], [name] Primary Care Provider: Scarlette [name] Other Clinician: Referring Provider: Treating Provider/Extender: [name] in Treatment: 2 Active Problems ICD-10 Encounter Code Description Active Date MDM Diagnosis E11.621 Type 2 diabetes mellitus with foot ulcer 07/07/2020 No Yes L97.528 Non-pressure chronic ulcer of other part of left foot with other specified 07/07/2020 No Yes severity L97.511 Non-pressure chronic ulcer of other part of right foot limited to breakdown of 07/07/2020 No Yes skin L89.622 Pressure ulcer of left heel, stage 2 07/07/2020 No Yes E11.42 Type 2 diabetes mellitus with diabetic polyneuropathy 07/07/2020 No Yes Inactive Problems ICD-10 Code Description Active Date Inactive Date L89.152  Pressure ulcer of sacral region, stage 2 07/07/2020 07/07/2020 Resolved Problems Electronic Signature(s) Signed: 07/21/2020 5:24:41 PM By: Linton Ham MD Entered By: Linton Ham on 07/21/2020  09:06:01 -------------------------------------------------------------------------------- Progress Note Details Patient Name: Date of Service: [name] [name] [facility]NA THA N L. 07/21/2020 8:00 A M Medical Record Number: [id number] Patient Account Number: [id number] Date of Birth/Sex: Treating RN: 17-Oct-1955 (65 y.o. [name] [name] Primary Care Provider: Scarlette [name] Other Clinician: Referring Provider: Treating Provider/Extender: [name] in Treatment: 2 Subjective History of Present Illness (HPI) ADMISSION 07/07/2020 This is a 65 year old man who is a type II diabetic. I have reviewed this in epic. It appeared that his diabetes was diagnosed in 2009 at which time he was on metformin. He was followed for a period of time by an endocrinologist with [facility] to always labeled him as a type II diabetic. Recently in epic there is been a switch to type 1 diabetes. If there were defining antibody test done I do not see them and I think the totality of the evidence would suggest that he is a type II diabetic. His recent problem started in May when he fell down 17 stairs. He suffered a scapular fracture he was hospitalized at [facility] for 9 days and then discharged to [facility]. His mother who is present said that most of these wounds seem to develop surrounding the discharge from hospital or the stay at [facility]. He has an area on his lower sacrum which appears currently to be a stage II but per their description this is actually improved quite a bit. He has an area on the left lateral foot at roughly the base of the fifth metatarsal, the left Achilles heel and the right fifth metatarsal head laterally they have been using topical antibiotics. Seen by primary care yesterday and placed on Keflex The patient had arterial studies on 05/05/2020. I wondered whether this had something to do with wounds on his feet at that time but I have not been able to determine this. At that time his  ABI on the right was 1.09 with a TBI of 0.65 on the left 0.88 with a TBI of 0.44. Waveforms were triphasic on the right monophasic on the left. He likely has significant PAD on the left Past medical history includes type 2 diabetes poorly controlled last hemoglobin A1c at 12.7 in May, hypertension, hyperlipidemia, atrial fib on Eliquis, coronary artery disease status post CABG, left scapular fracture in May of this year, AAA repair. Bilateral leg weakness ABIs were not done in our clinic as they were just done in May of this year at vein and vascular 7/14; patient arrives back in clinic he has areas on the left Achilles heel, base of the left fifth metatarsal, right lateral fifth metatarsal. The area he had over the coccyx appears to be healed but this area remains vulnerable. We have been using Iodoflex. I rereviewed his ABIs which were done in May. I do not know exactly what prompted these test however they do show a TBI of 0.44 on the left and monophasic waveforms. 7/21; the patient's wounds are about the same. Certainly no healing and the nurses noted some green drainage on the right fifth metatarsal head, left Achilles a little larger. We have been using silver alginate really without any improvement. We did get an appointment with vascular surgery however its that it did the end of August and they seem to want to repeat the noninvasive test that they have  already done in May. I am not sure what the issue here is. We advised the patient's sister to call and point to the idea that they have already had noninvasive studies. The real question I have is does this patient need an angiogram. He is a diabetic and it is possible that the ABIs are falsely elevated because of medial calcification. At least on the left his TBI was abnormal Objective Constitutional Sitting or standing Blood Pressure is within target range for patient.. Pulse regular and within target range for patient.Marland Kitchen Respirations  regular, non-labored and within target range.. Temperature is normal and within the target range for the patient.Marland Kitchen Appears in no distress. Vitals Time Taken: 8:17 AM, Height: 74 in, Source: Stated, Weight: 151 lbs, Source: Stated, BMI: 19.4, Temperature: 98.4 F, Pulse: 62 bpm, Respiratory Rate: 18 breaths/min, Blood Pressure: 135/81 mmHg, Capillary Blood Glucose: 351 mg/dl. General Notes: glucose per pt report this am General Notes: Wound exam; on the left at the base of the fifth metatarsal head and necrotic circumference some debris on the surface no evidence of infection He also has the area on the Achilles which is more superficial some debris on the surface that I did not remove today oo On the right lateral foot at the lateral aspect of the fifth MTP this is clean I saw no evidence of infection in spite of the green drainage Integumentary (Hair, Skin) Wound #1 status is Open. Original cause of wound was Pressure Injury. The date acquired was: 05/23/2020. The wound has been in treatment 2 weeks. The wound is located on the Left Calcaneus. The wound measures 3.1cm length x 2cm width x 0.1cm depth; 4.869cm^2 area and 0.487cm^3 volume. There is Fat Layer (Subcutaneous Tissue) exposed. There is no tunneling or undermining noted. There is a medium amount of serosanguineous drainage noted. The wound margin is flat and intact. There is medium (34-66%) red, pink granulation within the wound bed. There is a medium (34-66%) amount of necrotic tissue within the wound bed including Adherent Slough. Wound #2 status is Open. Original cause of wound was Gradually Appeared. The date acquired was: 06/21/2020. The wound has been in treatment 2 weeks. The wound is located on the Left,Lateral Foot. The wound measures 1.4cm length x 1.3cm width x 0.1cm depth; 1.429cm^2 area and 0.143cm^3 volume. There is Fat Layer (Subcutaneous Tissue) exposed. There is no tunneling or undermining noted. There is a small amount of  serosanguineous drainage noted. The wound margin is distinct with the outline attached to the wound base. There is small (1-33%) red granulation within the wound bed. There is a large (67-100%) amount of necrotic tissue within the wound bed including Eschar. Wound #3 status is Open. Original cause of wound was Gradually Appeared. The date acquired was: 06/21/2020. The wound has been in treatment 2 weeks. The wound is located on the Right,Lateral Foot. The wound measures 1.2cm length x 1cm width x 0.1cm depth; 0.942cm^2 area and 0.094cm^3 volume. There is Fat Layer (Subcutaneous Tissue) exposed. There is no tunneling or undermining noted. There is a medium amount of purulent drainage noted. The wound margin is distinct with the outline attached to the wound base. There is large (67-100%) red granulation within the wound bed. There is a small (1-33%) amount of necrotic tissue within the wound bed including Adherent Slough. General Notes: bright green drainage Assessment Active Problems ICD-10 Type 2 diabetes mellitus with foot ulcer Non-pressure chronic ulcer of other part of left foot with other specified severity Non-pressure chronic  ulcer of other part of right foot limited to breakdown of skin Pressure ulcer of left heel, stage 2 Type 2 diabetes mellitus with diabetic polyneuropathy Plan Follow-up Appointments: Return appointment in 1 month. - Dr. Dellia Nims 08/18/2020 Return Appointment in: - See Margarita Grizzle 08/08/2020 Other: - [facility] and Vascular appt 08/29/2020. Bathing/ Shower/ Hygiene: May shower and wash wound with soap and water. - with dressing changes only. Off-Loading: Turn and reposition every 2 hours Other: - float heels while resting in bed or chair. Use a pillow or rolled sheet under legs to offload the heels. Home Health: No change in wound care orders this week; continue Home Health for wound care. May utilize formulary equivalent dressing for wound treatment orders unless otherwise  specified. - [facility] home health twice a week and family once a week. WOUND #1: - Calcaneus Wound Laterality: Left Cleanser: Soap and Water (Home Health) 3 x Per Week/30 Days Discharge Instructions: May shower and wash wound with dial antibacterial soap and water prior to dressing change. Cleanser: Wound Cleanser (Home Health) 3 x Per Week/30 Days Discharge Instructions: Cleanse the wound with wound cleanser prior to applying a clean dressing using gauze sponges, not tissue or cotton balls. Prim Dressing: Hydrofera Blue Classic Foam, 4x4 in (Home Health) 3 x Per Week/30 Days ary Discharge Instructions: Moisten with saline prior to applying to wound bed Secondary Dressing: Woven Gauze Sponge, Non-Sterile 4x4 in (Home Health) 3 x Per Week/30 Days Discharge Instructions: Apply over primary dressing as directed. Secondary Dressing: ALLEVYN Heel 4 1/2in x 5 1/2in / 10.5cm x 13.5cm (Home Health) 3 x Per Week/30 Days Discharge Instructions: Apply over primary dressing as directed. Secured With: [employer], 4.5x3.1 (in/yd) (Home Health) 3 x Per Week/30 Days Discharge Instructions: Secure with Kerlix as directed. Secured With: 41M Medipore H Soft Cloth Surgical T 4 x 2 (in/yd) (Home Health) 3 x Per Week/30 Days ape Discharge Instructions: Secure dressing with tape as directed. WOUND #2: - Foot Wound Laterality: Left, Lateral Cleanser: Soap and Water (Home Health) 3 x Per Week/30 Days Discharge Instructions: May shower and wash wound with dial antibacterial soap and water prior to dressing change. Cleanser: Wound Cleanser (Home Health) 3 x Per Week/30 Days Discharge Instructions: Cleanse the wound with wound cleanser prior to applying a clean dressing using gauze sponges, not tissue or cotton balls. Prim Dressing: Hydrofera Blue Classic Foam, 4x4 in (Home Health) 3 x Per Week/30 Days ary Discharge Instructions: Moisten with saline prior to applying to wound bed Secondary Dressing: Woven  Gauze Sponge, Non-Sterile 4x4 in (Home Health) 3 x Per Week/30 Days Discharge Instructions: Apply over primary dressing as directed. Secured With: [employer], 4.5x3.1 (in/yd) (Home Health) 3 x Per Week/30 Days Discharge Instructions: Secure with Kerlix as directed. Secured With: 41M Medipore H Soft Cloth Surgical T 4 x 2 (in/yd) (Home Health) 3 x Per Week/30 Days ape Discharge Instructions: Secure dressing with tape as directed. WOUND #3: - Foot Wound Laterality: Right, Lateral Cleanser: Soap and Water (Home Health) 3 x Per Week/30 Days Discharge Instructions: May shower and wash wound with dial antibacterial soap and water prior to dressing change. Cleanser: Wound Cleanser (Home Health) 3 x Per Week/30 Days Discharge Instructions: Cleanse the wound with wound cleanser prior to applying a clean dressing using gauze sponges, not tissue or cotton balls. Prim Dressing: Hydrofera Blue Classic Foam, 4x4 in (Home Health) 3 x Per Week/30 Days ary Discharge Instructions: Moisten with saline prior to applying to wound bed Secondary Dressing:  Woven Gauze Sponge, Non-Sterile 4x4 in (Home Health) 3 x Per Week/30 Days Discharge Instructions: Apply over primary dressing as directed. Secured With: [employer], 4.5x3.1 (in/yd) (Home Health) 3 x Per Week/30 Days Discharge Instructions: Secure with Kerlix as directed. Secured With: 55M Medipore H Soft Cloth Surgical T 4 x 2 (in/yd) (Home Health) 3 x Per Week/30 Days ape Discharge Instructions: Secure dressing with tape as directed. 1. I change the dressing to Hydrofera Blue 2. Asked his sister to call [facility] and make certain that he needs more noninvasive studies. What I really asking is does he need an angiogram 3. He has had 2 falls. He is now using his walker. I emphasized the importance of this I am not exactly sure why Electronic Signature(s) Signed: 07/21/2020 5:24:41 PM By: Linton Ham MD Entered By: Linton Ham on 07/21/2020  09:12:17 -------------------------------------------------------------------------------- SuperBill Details Patient Name: Date of Service: [name] [name] HNA THA N L. 07/21/2020 Medical Record Number: [id number] Patient Account Number: [id number] Date of Birth/Sex: Treating RN: Jun 04, 1955 (65 y.o. [name] [name], [name] Primary Care Provider: Scarlette [name] Other Clinician: Referring Provider: Treating Provider/Extender: [name] in Treatment: 2 Diagnosis Coding ICD-10 Codes Code Description [phone] Type 2 diabetes mellitus with foot ulcer L97.528 Non-pressure chronic ulcer of other part of left foot with other specified severity L97.511 Non-pressure chronic ulcer of other part of right foot limited to breakdown of skin L89.622 Pressure ulcer of left heel, stage 2 E11.42 Type 2 diabetes mellitus with diabetic polyneuropathy Facility Procedures CPT4 Code: [id number] Description: [phone] - WOUND CARE VISIT-LEV 5 EST PT Modifier: Quantity: 1 Physician Procedures : CPT4 Code Description Modifier [id number] - WC PHYS LEVEL 3 - EST PT ICD-10 Diagnosis Description E11.621 Type 2 diabetes mellitus with foot ulcer L97.528 Non-pressure chronic ulcer of other part of left foot with other specified severity L97.511  Non-pressure chronic ulcer of other part of right foot limited to breakdown of skin L89.622 Pressure ulcer of left heel, stage 2 Quantity: 1 Electronic Signature(s) Signed: 07/21/2020 5:24:41 PM By: Linton Ham MD Signed: 07/21/2020 6:31:47 PM By: [name] Entered By: [name] on 07/21/2020 09:24:18

## 2020-07-21 NOTE — Progress Notes (Signed)
[name] [name], [place] ([id number]) Visit Report for 07/21/2020 Fall Risk Assessment Details Patient Name: Date of Service: [name] [facility] THA N L. 07/21/2020 8:00 A M Medical Record Number: [id number] Patient Account Number: [id number] Date of Birth/Sex: Treating RN: [birth year] (65 y.o. [name], [name] Primary Care Penni Penado: [name] Other Clinician: Referring Shanai Lartigue: Treating Mandel Seiden/Extender: [name] in Treatment: 2 Fall Risk Assessment Items Have you had 2 or more falls in the last 12 monthso 0 Yes Have you had any fall that resulted in injury in the last 12 monthso 0 No FALLS RISK SCREEN History of falling - immediate or within 3 months 25 Yes Secondary diagnosis (Do you have 2 or more medical diagnoseso) 0 No Ambulatory aid None/bed rest/wheelchair/nurse 0 Yes Crutches/cane/walker 15 Yes Furniture 0 No Intravenous therapy Access/Saline/Heparin Lock 0 No Gait/Transferring Normal/ bed rest/ wheelchair 0 No Weak (short steps with or without shuffle, stooped but able to lift head while walking, may seek 10 Yes support from furniture) Impaired (short steps with shuffle, may have difficulty arising from chair, head down, impaired 0 No balance) Mental Status Oriented to own ability 0 Yes Electronic Signature(s) Signed: 07/21/2020 6:31:47 PM By: [name] Entered By: [name] on 07/21/2020 08:55:21

## 2020-07-22 DIAGNOSIS — S42102D Fracture of unspecified part of scapula, left shoulder, subsequent encounter for fracture with routine healing: Secondary | ICD-10-CM | POA: Diagnosis not present

## 2020-07-22 DIAGNOSIS — L89150 Pressure ulcer of sacral region, unstageable: Secondary | ICD-10-CM | POA: Diagnosis not present

## 2020-07-23 DIAGNOSIS — L89154 Pressure ulcer of sacral region, stage 4: Secondary | ICD-10-CM | POA: Diagnosis not present

## 2020-07-23 DIAGNOSIS — E11621 Type 2 diabetes mellitus with foot ulcer: Secondary | ICD-10-CM | POA: Diagnosis not present

## 2020-07-23 DIAGNOSIS — L97519 Non-pressure chronic ulcer of other part of right foot with unspecified severity: Secondary | ICD-10-CM | POA: Diagnosis not present

## 2020-07-23 DIAGNOSIS — L97529 Non-pressure chronic ulcer of other part of left foot with unspecified severity: Secondary | ICD-10-CM | POA: Diagnosis not present

## 2020-07-23 DIAGNOSIS — L97429 Non-pressure chronic ulcer of left heel and midfoot with unspecified severity: Secondary | ICD-10-CM | POA: Diagnosis not present

## 2020-07-25 NOTE — Progress Notes (Signed)
SELIG, WAMPOLE ([id number]) Visit Report for 07/21/2020 Arrival Information Details Patient Name: Date of Service: [name] [name] [facility] THA N L. 07/21/2020 8:00 A M Medical Record Number: [id number] Patient Account Number: [id number] Date of Birth/Sex: Treating RN: Sep 10, 1955 (65 y.o. [name] Primary Care Maraya Gwilliam: [name] Other Clinician: Referring Denney Shein: Treating Nethan Caudillo/Extender: [name] in Treatment: 2 Visit Information History Since Last Visit Added or deleted any medications: No Patient Arrived: Wheel Chair Any new allergies or adverse reactions: No Arrival Time: 08:16 Had a fall or experienced change in Yes Accompanied By: sister activities of daily living that may affect Transfer Assistance: None risk of falls: Patient Identification Verified: Yes Signs or symptoms of abuse/neglect since last visito No Secondary Verification Process Completed: Yes Hospitalized since last visit: No Patient Requires Transmission-Based Precautions: No Implantable device outside of the clinic excluding No Patient Has Alerts: Yes cellular tissue based products placed in the center Patient Alerts: Patient on Blood Thinner since last visit: Has Dressing in Place as Prescribed: Yes Pain Present Now: Yes Electronic Signature(s) Signed: 07/21/2020 6:07:52 PM By: Baruch Gouty RN, BSN Entered By: Baruch Gouty on 07/21/2020 08:17:16 -------------------------------------------------------------------------------- Clinic Level of Care Assessment Details Patient Name: Date of Service: [name] [name] THA N L. 07/21/2020 8:00 A M Medical Record Number: [id number] Patient Account Number: [id number] Date of Birth/Sex: Treating RN: 01-11-1955 (65 y.o. [name], [name] Primary Care Lundyn Coste: [name] Other Clinician: Referring Jerl Munyan: Treating Statia Burdick/Extender: [name] in Treatment: 2 Clinic Level of Care Assessment  Items TOOL 4 Quantity Score X- 1 0 Use when only an EandM is performed on FOLLOW-UP visit ASSESSMENTS - Nursing Assessment / Reassessment X- 1 10 Reassessment of Co-morbidities (includes updates in patient status) X- 1 5 Reassessment of Adherence to Treatment Plan ASSESSMENTS - Wound and Skin A ssessment / Reassessment _0  - 0 Simple Wound Assessment / Reassessment - one wound X- 3 5 Complex Wound Assessment / Reassessment - multiple wounds X- 1 10 Dermatologic / Skin Assessment (not related to wound area) ASSESSMENTS - Focused Assessment X- 2 5 Circumferential Edema Measurements - multi extremities X- 1 10 Nutritional Assessment / Counseling / Intervention _1  - 0 Lower Extremity Assessment (monofilament, tuning fork, pulses) _2  - 0 Peripheral Arterial Disease Assessment (using hand held doppler) ASSESSMENTS - Ostomy and/or Continence Assessment and Care _3  - 0 Incontinence Assessment and Management _4  - 0 Ostomy Care Assessment and Management (repouching, etc.) PROCESS - Coordination of Care _5  - 0 Simple Patient / Family Education for ongoing care X- 1 20 Complex (extensive) Patient / Family Education for ongoing care X- 1 10 Staff obtains Programmer, systems, Records, T Results / Process Orders est X- 1 10 Staff telephones HHA, Nursing Homes / Clarify orders / etc _6  - 0 Routine Transfer to another Facility (non-emergent condition) _7  - 0 Routine Hospital Admission (non-emergent condition) _8  - 0 New Admissions / Biomedical engineer / Ordering NPWT Apligraf, etc. , _9  - 0 Emergency Hospital Admission (emergent condition) _10  - 0 Simple Discharge Coordination X- 1 15 Complex (extensive) Discharge Coordination PROCESS - Special Needs _11  - 0 Pediatric / Minor Patient Management _12  - 0 Isolation Patient Management _13  - 0 Hearing / Language / Visual special needs _14  - 0 Assessment of Community assistance (transportation, D/C planning, etc.) _15  - 0 Additional  assistance / Altered mentation _16  - 0 Support Surface(s) Assessment (bed, cushion, seat, etc.) INTERVENTIONS - Wound Cleansing / Measurement _17  - 0 Simple Wound Cleansing -  one wound X- 3 5 Complex Wound Cleansing - multiple wounds X- 1 5 Wound Imaging (photographs - any number of wounds) _0  - 0 Wound Tracing (instead of photographs) _1  - 0 Simple Wound Measurement - one wound X- 3 5 Complex Wound Measurement - multiple wounds INTERVENTIONS - Wound Dressings _2  - 0 Small Wound Dressing one or multiple wounds X- 2 15 Medium Wound Dressing one or multiple wounds _3  - 0 Large Wound Dressing one or multiple wounds <ATFTDDUKGURKYHCW>_2<\/BJSEGBTDVVOHYWVP>_7  - 0 Application of Medications - topical <TGGYIRSWNIOEVOJJ>_0<\/KXFGHWEXHBZJIRCV>_8  - 0 Application of Medications - injection INTERVENTIONS - Miscellaneous _6  - 0 External ear exam _7  - 0 Specimen Collection (cultures, biopsies, blood, body fluids, etc.) _8  - 0 Specimen(s) / Culture(s) sent or taken to Lab for analysis _9  - 0 Patient Transfer (multiple staff / Civil Service fast streamer / Similar devices) _10  - 0 Simple Staple / Suture removal (25 or less) _11  - 0 Complex Staple / Suture removal (26 or more) _12  - 0 Hypo / Hyperglycemic Management (close monitor of Blood Glucose) _13  - 0 Ankle / Brachial Index (ABI) - do not check if billed separately X- 1 5 Vital Signs Has the patient been seen at the hospital within the last three years: Yes Total Score: 185 Level Of Care: New/Established - Level 5 Electronic Signature(s) Signed: 07/21/2020 6:31:47 PM By: [name] Entered By: [name] on 07/21/2020 09:24:12 -------------------------------------------------------------------------------- Encounter Discharge Information Details Patient Name: Date of Service: [name] THA N L. 07/21/2020 8:00 A M Medical Record Number: [id number] Patient Account Number: [id number] Date of Birth/Sex: Treating RN: 11-Aug-1955 (65 y.o. [name] Primary Care Brevan Luberto: [name] Other Clinician: Referring  Chaos Carlile: Treating Ladona Rosten/Extender: [name] in Treatment: 2 Encounter Discharge Information Items Discharge Condition: Stable Ambulatory Status: Wheelchair Discharge Destination: Home Transportation: Private Auto Accompanied By: sister Schedule Follow-up Appointment: Yes Clinical Summary of Care: Patient Declined Electronic Signature(s) Signed: 07/25/2020 4:44:07 PM By: Levan Hurst RN, BSN Entered By: Levan Hurst on 07/21/2020 13:24:15 -------------------------------------------------------------------------------- Lower Extremity Assessment Details Patient Name: Date of Service: [name] THA N L. 07/21/2020 8:00 A M Medical Record Number: [id number] Patient Account Number: [id number] Date of Birth/Sex: Treating RN: June 22, 1955 (65 y.o. [name] Primary Care Treasure Ochs: [name] Other Clinician: Referring Collier Monica: Treating Nikka Hakimian/Extender: [name] in Treatment: 2 Edema Assessment Assessed: [name]: No] [name]: No] Edema: [Left: No] [Right: No] Calf Left: Right: Point of Measurement: 34 cm From Medial Instep 31 cm 30 cm Ankle Left: Right: Point of Measurement: 10 cm From Medial Instep 23.1 cm 22.5 cm Vascular Assessment Pulses: Dorsalis Pedis Palpable: [Left:No] [Right:No] Electronic Signature(s) Signed: 07/21/2020 6:07:52 PM By: Baruch Gouty RN, BSN Entered By: Baruch Gouty on 07/21/2020 08:28:03 -------------------------------------------------------------------------------- Multi Wound Chart Details Patient Name: Date of Service: [name] THA N L. 07/21/2020 8:00 A M Medical Record Number: [id number] Patient Account Number: [id number] Date of Birth/Sex: Treating RN: Sep 03, 1955 (65 y.o. [name] Primary Care Daziah Hesler: [name] Other Clinician: Referring Skyeler Scalese: Treating Yomaira Solar/Extender: [name] in Treatment: 2 Vital  Signs Height(in): 74 Capillary Blood Glucose(mg/dl): 351 Weight(lbs): 151 Pulse(bpm): 44 Body Mass Index(BMI): 61 Blood Pressure(mmHg): 135/81 Temperature(F): 98.4 Respiratory Rate(breaths/min): 18 Photos: [1:No Photos Left Calcaneus] [2:No Photos Left, Lateral Foot] [3:No Photos Right, Lateral Foot] Wound Location: [1:Pressure Injury] [2:Gradually Appeared] [3:Gradually Appeared] Wounding Event: [1:Pressure Ulcer] [2:Diabetic Wound/Ulcer of the Lower] [3:Diabetic Wound/Ulcer of the Lower] Primary Etiology: [1:Coronary Artery Disease,] [2:Extremity Coronary  Artery Disease,] [3:Extremity Coronary Artery Disease,] Comorbid History: [1:Hypertension, Type II Diabetes, Osteoarthritis, Neuropathy 05/23/2020] [2:Hypertension, Type II Diabetes, Osteoarthritis, Neuropathy 06/21/2020] [3:Hypertension, Type II Diabetes, Osteoarthritis, Neuropathy 06/21/2020] Date Acquired: [1:2] [2:2] [3:2] Weeks of Treatment: [1:Open] [2:Open] [3:Open] Wound Status: [1:3.1x2x0.1] [2:1.4x1.3x0.1] [3:1.2x1x0.1] Measurements L x W x D (cm) [1:4.869] [2:1.429] [3:0.942] A (cm) : rea [1:0.487] [2:0.143] [3:0.094] Volume (cm) : [1:61.30%] [2:-102.10%] [3:53.00%] % Reduction in A rea: [1:80.60%] [2:-101.40%] [3:76.60%] % Reduction in Volume: [1:Category/Stage III] [2:Grade 1] [3:Grade 1] Classification: [1:Medium] [2:Small] [3:Medium] Exudate A mount: [1:Serosanguineous] [2:Serosanguineous] [3:Purulent] Exudate Type: [1:red, brown] [2:red, brown] [3:yellow, brown, green] Exudate Color: [1:Flat and Intact] [2:Distinct, outline attached] [3:Distinct, outline attached] Wound Margin: [1:Medium (34-66%)] [2:Small (1-33%)] [3:Large (67-100%)] Granulation A mount: [1:Red, Pink] [2:Red] [3:Red] Granulation Quality: [1:Medium (34-66%)] [2:Large (67-100%)] [3:Small (1-33%)] Necrotic A mount: [1:Adherent Slough] [2:Eschar] [3:Adherent Slough] Necrotic Tissue: [1:Fat Layer (Subcutaneous Tissue): Yes Fat Layer (Subcutaneous  Tissue): Yes Fat Layer (Subcutaneous Tissue): Yes] Exposed Structures: [1:Fascia: No Tendon: No Muscle: No Joint: No Bone: No None] [2:Fascia: No Tendon: No Muscle: No Joint: No Bone: No None] [3:Fascia: No Tendon: No Muscle: No Joint: No Bone: No None] Epithelialization: [1:N/A] [2:N/A] [3:bright green drainage] Treatment Notes Electronic Signature(s) Signed: 07/21/2020 5:24:41 PM By: Linton Ham MD Signed: 07/21/2020 6:31:47 PM By: [name] Entered By: Linton Ham on 07/21/2020 09:06:13 -------------------------------------------------------------------------------- Multi-Disciplinary Care Plan Details Patient Name: Date of Service: [name] THA N L. 07/21/2020 8:00 A M Medical Record Number: [id number] Patient Account Number: [id number] Date of Birth/Sex: Treating RN: 30-Dec-1955 (65 y.o. [name], [name] Primary Care Aireonna Bauer: [name] Other Clinician: Referring Maurene Hollin: Treating Tyshan Enderle/Extender: [name] in Treatment: 2 Active Inactive Pain, Acute or Chronic Nursing Diagnoses: Pain, acute or chronic: actual or potential Potential alteration in comfort, pain Goals: Patient will verbalize adequate pain control and receive pain control interventions during procedures as needed Date Initiated: 07/07/2020 Target Resolution Date: 08/25/2020 Goal Status: Active Patient/caregiver will verbalize comfort level met Date Initiated: 07/07/2020 Target Resolution Date: 08/18/2020 Goal Status: Active Interventions: Encourage patient to take pain medications as prescribed Provide education on pain management Reposition patient for comfort Treatment Activities: Administer pain control measures as ordered : 07/07/2020 Notes: Wound/Skin Impairment Nursing Diagnoses: Knowledge deficit related to ulceration/compromised skin integrity Goals: Patient/caregiver will verbalize understanding of skin care regimen Date Initiated: 07/07/2020 Target  Resolution Date: 08/18/2020 Goal Status: Active Interventions: Assess patient/caregiver ability to obtain necessary supplies Assess patient/caregiver ability to perform ulcer/skin care regimen upon admission and as needed Provide education on ulcer and skin care Treatment Activities: Skin care regimen initiated : 07/07/2020 Topical wound management initiated : 07/07/2020 Notes: Electronic Signature(s) Signed: 07/21/2020 6:31:47 PM By: [name] Entered By: [name] on 07/21/2020 08:06:13 -------------------------------------------------------------------------------- Pain Assessment Details Patient Name: Date of Service: [name] [name] THA N L. 07/21/2020 8:00 A M Medical Record Number: [id number] Patient Account Number: [id number] Date of Birth/Sex: Treating RN: Apr 11, 1955 (65 y.o. [name] Primary Care Jahmier Willadsen: [name] Other Clinician: Referring Rasool Rommel: Treating Gareld Obrecht/Extender: [name] in Treatment: 2 Active Problems Location of Pain Severity and Description of Pain Patient Has Paino Yes Site Locations Pain Location: Generalized Pain, Pain in Ulcers With Dressing Change: No Duration of the Pain. Constant / Intermittento Constant Rate the pain. Current Pain Level: 6 Least Pain Level: 4 Character of Pain Describe the Pain: Aching Pain Management and Medication Current Pain Management: Medication: Yes Other: reposition Is the Current Pain Management Adequate:  Adequate How does your wound impact your activities of daily livingo Sleep: Yes Bathing: No Appetite: No Relationship With Others: No Bladder Continence: No Emotions: Yes Bowel Continence: No Work: No Toileting: No Drive: No Dressing: No Hobbies: No Electronic Signature(s) Signed: 07/21/2020 6:07:52 PM By: Baruch Gouty RN, BSN Entered By: Baruch Gouty on 07/21/2020  08:21:04 -------------------------------------------------------------------------------- Patient/Caregiver Education Details Patient Name: Date of Service: [name] [name] THA N L. 7/21/2022andnbsp8:00 A M Medical Record Number: [id number] Patient Account Number: [id number] Date of Birth/Gender: Treating RN: Sep 20, 1955 (65 y.o. [name] Primary Care Physician: [name] Other Clinician: Referring Physician: Treating Physician/Extender: [name] in Treatment: 2 Education Assessment Education Provided To: Patient Education Topics Provided Wound/Skin Impairment: Handouts: Skin Care Do's and Dont's Methods: Explain/Verbal Responses: Reinforcements needed Electronic Signature(s) Signed: 07/21/2020 6:31:47 PM By: [name] Entered By: [name] on 07/21/2020 08:06:23 -------------------------------------------------------------------------------- Wound Assessment Details Patient Name: Date of Service: [name] [name] THA N L. 07/21/2020 8:00 A M Medical Record Number: [id number] Patient Account Number: [id number] Date of Birth/Sex: Treating RN: 22-Dec-1955 (65 y.o. [name] Primary Care Jarquavious Fentress: [name] Other Clinician: Referring Asha Grumbine: Treating Teagan Ozawa/Extender: [name] in Treatment: 2 Wound Status Wound Number: 1 Primary Pressure Ulcer Etiology: Wound Location: Left Calcaneus Wound Open Wounding Event: Pressure Injury Status: Date Acquired: 05/23/2020 Comorbid Coronary Artery Disease, Hypertension, Type II Diabetes, Weeks Of Treatment: 2 History: Osteoarthritis, Neuropathy Clustered Wound: No Photos Photo Uploaded By: Donavan Burnet on 07/22/2020 08:57:50 Wound Measurements Length: (cm) 3.1 Width: (cm) 2 Depth: (cm) 0.1 Area: (cm) 4.869 Volume: (cm) 0.487 % Reduction in Area: 61.3% % Reduction in Volume: 80.6% Epithelialization: None Tunneling: No Undermining:  No Wound Description Classification: Category/Stage III Wound Margin: Flat and Intact Exudate Amount: Medium Exudate Type: Serosanguineous Exudate Color: red, brown Foul Odor After Cleansing: No Slough/Fibrino Yes Wound Bed Granulation Amount: Medium (34-66%) Exposed Structure Granulation Quality: Red, Pink Fascia Exposed: No Necrotic Amount: Medium (34-66%) Fat Layer (Subcutaneous Tissue) Exposed: Yes Necrotic Quality: Adherent Slough Tendon Exposed: No Muscle Exposed: No Joint Exposed: No Bone Exposed: No Treatment Notes Wound #1 (Calcaneus) Wound Laterality: Left Cleanser Soap and Water Discharge Instruction: May shower and wash wound with dial antibacterial soap and water prior to dressing change. Wound Cleanser Discharge Instruction: Cleanse the wound with wound cleanser prior to applying a clean dressing using gauze sponges, not tissue or cotton balls. Peri-Wound Care Topical Primary Dressing Hydrofera Blue Classic Foam, 4x4 in Discharge Instruction: Moisten with saline prior to applying to wound bed Secondary Dressing Woven Gauze Sponge, Non-Sterile 4x4 in Discharge Instruction: Apply over primary dressing as directed. ALLEVYN Heel 4 1/2in x 5 1/2in / 10.5cm x 13.5cm Discharge Instruction: Apply over primary dressing as directed. Secured With [employer], 4.5x3.1 (in/yd) Discharge Instruction: Secure with Kerlix as directed. 33M Medipore H Soft Cloth Surgical T 4 x 2 (in/yd) ape Discharge Instruction: Secure dressing with tape as directed. Compression Wrap Compression Stockings Add-Ons Electronic Signature(s) Signed: 07/21/2020 6:07:52 PM By: Baruch Gouty RN, BSN Entered By: Baruch Gouty on 07/21/2020 08:34:03 -------------------------------------------------------------------------------- Wound Assessment Details Patient Name: Date of Service: [name] [name] THA N L. 07/21/2020 8:00 A M Medical Record Number: [id number] Patient Account Number:  [id number] Date of Birth/Sex: Treating RN: [birth year] (65 y.o. [name] Primary Care Nelvin Tomb: [name] Other Clinician: Referring Epimenio Schetter: Treating Jencarlo Bonadonna/Extender: [name] in Treatment: 2 Wound Status Wound Number: 2 Primary Diabetic Wound/Ulcer of the Lower  Extremity Etiology: Wound Location: Left, Lateral Foot Wound Open Wounding Event: Gradually Appeared Status: Date Acquired: 06/21/2020 Comorbid Coronary Artery Disease, Hypertension, Type II Diabetes, Weeks Of Treatment: 2 History: Osteoarthritis, Neuropathy Clustered Wound: No Photos Photo Uploaded By: Donavan Burnet on 07/22/2020 08:57:51 Wound Measurements Length: (cm) 1.4 Width: (cm) 1.3 Depth: (cm) 0.1 Area: (cm) 1.429 Volume: (cm) 0.143 % Reduction in Area: -102.1% % Reduction in Volume: -101.4% Epithelialization: None Tunneling: No Undermining: No Wound Description Classification: Grade 1 Wound Margin: Distinct, outline attached Exudate Amount: Small Exudate Type: Serosanguineous Exudate Color: red, brown Foul Odor After Cleansing: No Slough/Fibrino No Wound Bed Granulation Amount: Small (1-33%) Exposed Structure Granulation Quality: Red Fascia Exposed: No Necrotic Amount: Large (67-100%) Fat Layer (Subcutaneous Tissue) Exposed: Yes Necrotic Quality: Eschar Tendon Exposed: No Muscle Exposed: No Joint Exposed: No Bone Exposed: No Treatment Notes Wound #2 (Foot) Wound Laterality: Left, Lateral Cleanser Soap and Water Discharge Instruction: May shower and wash wound with dial antibacterial soap and water prior to dressing change. Wound Cleanser Discharge Instruction: Cleanse the wound with wound cleanser prior to applying a clean dressing using gauze sponges, not tissue or cotton balls. Peri-Wound Care Topical Primary Dressing Hydrofera Blue Classic Foam, 4x4 in Discharge Instruction: Moisten with saline prior to applying to wound bed Secondary  Dressing Woven Gauze Sponge, Non-Sterile 4x4 in Discharge Instruction: Apply over primary dressing as directed. Secured With [employer], 4.5x3.1 (in/yd) Discharge Instruction: Secure with Kerlix as directed. 78M Medipore H Soft Cloth Surgical T 4 x 2 (in/yd) ape Discharge Instruction: Secure dressing with tape as directed. Compression Wrap Compression Stockings Add-Ons Electronic Signature(s) Signed: 07/21/2020 6:07:52 PM By: Baruch Gouty RN, BSN Entered By: Baruch Gouty on 07/21/2020 08:34:20 -------------------------------------------------------------------------------- Wound Assessment Details Patient Name: Date of Service: [name] [name] THA N L. 07/21/2020 8:00 A M Medical Record Number: [id number] Patient Account Number: [id number] Date of Birth/Sex: Treating RN: [birth year] (65 y.o. [name] Primary Care Brentlee Sciara: [name] Other Clinician: Referring Dakiyah Heinke: Treating Ayra Hodgdon/Extender: [name] in Treatment: 2 Wound Status Wound Number: 3 Primary Diabetic Wound/Ulcer of the Lower Extremity Etiology: Wound Location: Right, Lateral Foot Wound Open Wounding Event: Gradually Appeared Status: Date Acquired: 06/21/2020 Comorbid Coronary Artery Disease, Hypertension, Type II Diabetes, Weeks Of Treatment: 2 History: Osteoarthritis, Neuropathy Clustered Wound: No Photos Photo Uploaded By: Donavan Burnet on 07/22/2020 08:56:34 Wound Measurements Length: (cm) 1.2 Width: (cm) 1 Depth: (cm) 0.1 Area: (cm) 0.942 Volume: (cm) 0.094 % Reduction in Area: 53% % Reduction in Volume: 76.6% Epithelialization: None Tunneling: No Undermining: No Wound Description Classification: Grade 1 Wound Margin: Distinct, outline attached Exudate Amount: Medium Exudate Type: Purulent Exudate Color: yellow, brown, green Foul Odor After Cleansing: No Slough/Fibrino Yes Wound Bed Granulation Amount: Large (67-100%) Exposed  Structure Granulation Quality: Red Fascia Exposed: No Necrotic Amount: Small (1-33%) Fat Layer (Subcutaneous Tissue) Exposed: Yes Necrotic Quality: Adherent Slough Tendon Exposed: No Muscle Exposed: No Joint Exposed: No Bone Exposed: No Assessment Notes bright green drainage Treatment Notes Wound #3 (Foot) Wound Laterality: Right, Lateral Cleanser Soap and Water Discharge Instruction: May shower and wash wound with dial antibacterial soap and water prior to dressing change. Wound Cleanser Discharge Instruction: Cleanse the wound with wound cleanser prior to applying a clean dressing using gauze sponges, not tissue or cotton balls. Peri-Wound Care Topical Primary Dressing Hydrofera Blue Classic Foam, 4x4 in Discharge Instruction: Moisten with saline prior to applying to wound bed Secondary Dressing Woven Gauze Sponge, Non-Sterile 4x4 in Discharge Instruction: Apply  over primary dressing as directed. Secured With [employer], 4.5x3.1 (in/yd) Discharge Instruction: Secure with Kerlix as directed. 62M Medipore H Soft Cloth Surgical T 4 x 2 (in/yd) ape Discharge Instruction: Secure dressing with tape as directed. Compression Wrap Compression Stockings Add-Ons Electronic Signature(s) Signed: 07/21/2020 6:07:52 PM By: Baruch Gouty RN, BSN Entered By: Baruch Gouty on 07/21/2020 08:35:11 -------------------------------------------------------------------------------- Vitals Details Patient Name: Date of Service: [name] [name] [facility] THA N L. 07/21/2020 8:00 A M Medical Record Number: [id number] Patient Account Number: [id number] Date of Birth/Sex: Treating RN: [birth year] (65 y.o. [name] Primary Care Josely Moffat: [name] Other Clinician: Referring Charleigh Correnti: Treating Artisha Capri/Extender: [name] in Treatment: 2 Vital Signs Time Taken: 08:17 Temperature (F): 98.4 Height (in): 74 Pulse (bpm): 62 Source: Stated Respiratory  Rate (breaths/min): 18 Weight (lbs): 151 Blood Pressure (mmHg): 135/81 Source: Stated Capillary Blood Glucose (mg/dl): 351 Body Mass Index (BMI): 19.4 Reference Range: 80 - 120 mg / dl Notes glucose per pt report this am Electronic Signature(s) Signed: 07/21/2020 6:07:52 PM By: Baruch Gouty RN, BSN Entered By: Baruch Gouty on 07/21/2020 09:64:38

## 2020-08-01 DIAGNOSIS — E785 Hyperlipidemia, unspecified: Secondary | ICD-10-CM | POA: Diagnosis not present

## 2020-08-01 DIAGNOSIS — Z7982 Long term (current) use of aspirin: Secondary | ICD-10-CM | POA: Diagnosis not present

## 2020-08-01 DIAGNOSIS — F3341 Major depressive disorder, recurrent, in partial remission: Secondary | ICD-10-CM | POA: Diagnosis not present

## 2020-08-01 DIAGNOSIS — Z794 Long term (current) use of insulin: Secondary | ICD-10-CM | POA: Diagnosis not present

## 2020-08-01 DIAGNOSIS — L89154 Pressure ulcer of sacral region, stage 4: Secondary | ICD-10-CM | POA: Diagnosis not present

## 2020-08-01 DIAGNOSIS — L409 Psoriasis, unspecified: Secondary | ICD-10-CM | POA: Diagnosis not present

## 2020-08-01 DIAGNOSIS — N138 Other obstructive and reflux uropathy: Secondary | ICD-10-CM | POA: Diagnosis not present

## 2020-08-01 DIAGNOSIS — F419 Anxiety disorder, unspecified: Secondary | ICD-10-CM | POA: Diagnosis not present

## 2020-08-01 DIAGNOSIS — N401 Enlarged prostate with lower urinary tract symptoms: Secondary | ICD-10-CM | POA: Diagnosis not present

## 2020-08-01 DIAGNOSIS — I10 Essential (primary) hypertension: Secondary | ICD-10-CM | POA: Diagnosis not present

## 2020-08-01 DIAGNOSIS — M1990 Unspecified osteoarthritis, unspecified site: Secondary | ICD-10-CM | POA: Diagnosis not present

## 2020-08-01 DIAGNOSIS — E1142 Type 2 diabetes mellitus with diabetic polyneuropathy: Secondary | ICD-10-CM | POA: Diagnosis not present

## 2020-08-01 DIAGNOSIS — Z79891 Long term (current) use of opiate analgesic: Secondary | ICD-10-CM | POA: Diagnosis not present

## 2020-08-01 DIAGNOSIS — D649 Anemia, unspecified: Secondary | ICD-10-CM | POA: Diagnosis not present

## 2020-08-01 DIAGNOSIS — I480 Paroxysmal atrial fibrillation: Secondary | ICD-10-CM | POA: Diagnosis not present

## 2020-08-01 DIAGNOSIS — K5904 Chronic idiopathic constipation: Secondary | ICD-10-CM | POA: Diagnosis not present

## 2020-08-01 DIAGNOSIS — G8929 Other chronic pain: Secondary | ICD-10-CM | POA: Diagnosis not present

## 2020-08-01 DIAGNOSIS — L89150 Pressure ulcer of sacral region, unstageable: Secondary | ICD-10-CM | POA: Diagnosis not present

## 2020-08-01 DIAGNOSIS — S42102D Fracture of unspecified part of scapula, left shoulder, subsequent encounter for fracture with routine healing: Secondary | ICD-10-CM | POA: Diagnosis not present

## 2020-08-05 DIAGNOSIS — I480 Paroxysmal atrial fibrillation: Secondary | ICD-10-CM | POA: Diagnosis not present

## 2020-08-05 DIAGNOSIS — F3341 Major depressive disorder, recurrent, in partial remission: Secondary | ICD-10-CM | POA: Diagnosis not present

## 2020-08-05 DIAGNOSIS — G8929 Other chronic pain: Secondary | ICD-10-CM | POA: Diagnosis not present

## 2020-08-05 DIAGNOSIS — L89150 Pressure ulcer of sacral region, unstageable: Secondary | ICD-10-CM | POA: Diagnosis not present

## 2020-08-05 DIAGNOSIS — L89154 Pressure ulcer of sacral region, stage 4: Secondary | ICD-10-CM | POA: Diagnosis not present

## 2020-08-05 DIAGNOSIS — K5904 Chronic idiopathic constipation: Secondary | ICD-10-CM | POA: Diagnosis not present

## 2020-08-05 DIAGNOSIS — L409 Psoriasis, unspecified: Secondary | ICD-10-CM | POA: Diagnosis not present

## 2020-08-05 DIAGNOSIS — N401 Enlarged prostate with lower urinary tract symptoms: Secondary | ICD-10-CM | POA: Diagnosis not present

## 2020-08-05 DIAGNOSIS — F419 Anxiety disorder, unspecified: Secondary | ICD-10-CM | POA: Diagnosis not present

## 2020-08-05 DIAGNOSIS — Z794 Long term (current) use of insulin: Secondary | ICD-10-CM | POA: Diagnosis not present

## 2020-08-05 DIAGNOSIS — I10 Essential (primary) hypertension: Secondary | ICD-10-CM | POA: Diagnosis not present

## 2020-08-05 DIAGNOSIS — N138 Other obstructive and reflux uropathy: Secondary | ICD-10-CM | POA: Diagnosis not present

## 2020-08-05 DIAGNOSIS — E1142 Type 2 diabetes mellitus with diabetic polyneuropathy: Secondary | ICD-10-CM | POA: Diagnosis not present

## 2020-08-05 DIAGNOSIS — D649 Anemia, unspecified: Secondary | ICD-10-CM | POA: Diagnosis not present

## 2020-08-05 DIAGNOSIS — M1990 Unspecified osteoarthritis, unspecified site: Secondary | ICD-10-CM | POA: Diagnosis not present

## 2020-08-05 DIAGNOSIS — Z7982 Long term (current) use of aspirin: Secondary | ICD-10-CM | POA: Diagnosis not present

## 2020-08-05 DIAGNOSIS — E785 Hyperlipidemia, unspecified: Secondary | ICD-10-CM | POA: Diagnosis not present

## 2020-08-05 DIAGNOSIS — S42102D Fracture of unspecified part of scapula, left shoulder, subsequent encounter for fracture with routine healing: Secondary | ICD-10-CM | POA: Diagnosis not present

## 2020-08-05 DIAGNOSIS — Z79891 Long term (current) use of opiate analgesic: Secondary | ICD-10-CM | POA: Diagnosis not present

## 2020-08-08 ENCOUNTER — Encounter (HOSPITAL_BASED_OUTPATIENT_CLINIC_OR_DEPARTMENT_OTHER): Payer: HMO | Attending: Physician Assistant | Admitting: Physician Assistant

## 2020-08-08 ENCOUNTER — Other Ambulatory Visit: Payer: Self-pay

## 2020-08-08 DIAGNOSIS — L97511 Non-pressure chronic ulcer of other part of right foot limited to breakdown of skin: Secondary | ICD-10-CM | POA: Diagnosis not present

## 2020-08-08 DIAGNOSIS — L97521 Non-pressure chronic ulcer of other part of left foot limited to breakdown of skin: Secondary | ICD-10-CM | POA: Diagnosis not present

## 2020-08-08 DIAGNOSIS — L97528 Non-pressure chronic ulcer of other part of left foot with other specified severity: Secondary | ICD-10-CM | POA: Insufficient documentation

## 2020-08-08 DIAGNOSIS — E11621 Type 2 diabetes mellitus with foot ulcer: Secondary | ICD-10-CM | POA: Insufficient documentation

## 2020-08-08 DIAGNOSIS — L89623 Pressure ulcer of left heel, stage 3: Secondary | ICD-10-CM | POA: Diagnosis not present

## 2020-08-08 DIAGNOSIS — L97512 Non-pressure chronic ulcer of other part of right foot with fat layer exposed: Secondary | ICD-10-CM | POA: Diagnosis not present

## 2020-08-08 DIAGNOSIS — L89622 Pressure ulcer of left heel, stage 2: Secondary | ICD-10-CM | POA: Insufficient documentation

## 2020-08-08 DIAGNOSIS — E1142 Type 2 diabetes mellitus with diabetic polyneuropathy: Secondary | ICD-10-CM | POA: Diagnosis not present

## 2020-08-08 LAB — GLUCOSE, CAPILLARY: Glucose-Capillary: 459 mg/dL — ABNORMAL HIGH (ref 70–99)

## 2020-08-08 NOTE — Progress Notes (Addendum)
KIEGAN, MANGINO ([id number]) Visit Report for 08/08/2020 Chief Complaint Document Details Patient Name: Date of Service: [name] [name] [facility] THA N L. 08/08/2020 8:00 A M Medical Record Number: [id number] Patient Account Number: [id number] Date of Birth/Sex: Treating RN: [birth year] (65 y.o. Marcheta Grammes Primary Care Provider: [name] Other Clinician: Referring Provider: Treating Provider/Extender: Gigi Gin in Treatment: 4 Information Obtained from: Patient Chief Complaint 07/07/2020; patient is in clinic for today for review of pressure ulcers on his coccyx and left heel and diabetic foot ulcers bilaterally. Electronic Signature(s) Signed: 08/08/2020 8:38:44 AM By: Worthy Keeler PA-C Entered By: Worthy Keeler on 08/08/2020 08:38:43 -------------------------------------------------------------------------------- Debridement Details Patient Name: Date of Service: [name] [name] [facility] THA N L. 08/08/2020 8:00 A M Medical Record Number: [id number] Patient Account Number: [id number] Date of Birth/Sex: Treating RN: July 24, 1955 (65 y.o. Marcheta Grammes Primary Care Provider: [name] Other Clinician: Referring Provider: Treating Provider/Extender: Gigi Gin in Treatment: 4 Debridement Performed for Assessment: Wound #1 Left Calcaneus Performed By: Physician Worthy Keeler, PA Debridement Type: Chemical/Enzymatic/Mechanical Agent Used: Santyl Level of Consciousness (Pre-procedure): Awake and Alert Pre-procedure Verification/Time Out Yes - 08:57 Taken: Start Time: 08:58 Bleeding: None End Time: 09:00 Response to Treatment: Procedure was tolerated well Level of Consciousness (Post- Awake and Alert procedure): Post Debridement Measurements of Total Wound Length: (cm) 3.4 Stage: Category/Stage III Width: (cm) 2.2 Depth: (cm) 0.1 Volume: (cm) 0.587 Character of Wound/Ulcer Post Debridement: Stable Post Procedure  Diagnosis Same as Pre-procedure Electronic Signature(s) Signed: 08/08/2020 5:53:47 PM By: Lorrin Jackson Signed: 08/08/2020 6:00:42 PM By: Worthy Keeler PA-C Entered By: Lorrin Jackson on 08/08/2020 09:03:01 -------------------------------------------------------------------------------- Debridement Details Patient Name: Date of Service: [name] [name] [facility] THA N L. 08/08/2020 8:00 A M Medical Record Number: [id number] Patient Account Number: [id number] Date of Birth/Sex: Treating RN: [birth year] (65 y.o. Marcheta Grammes Primary Care Provider: [name] Other Clinician: Referring Provider: Treating Provider/Extender: Gigi Gin in Treatment: 4 Debridement Performed for Assessment: Wound #2 Left,Lateral Foot Performed By: Physician Worthy Keeler, PA Debridement Type: Chemical/Enzymatic/Mechanical Agent Used: Santyl Severity of Tissue Pre Debridement: Fat layer exposed Level of Consciousness (Pre-procedure): Awake and Alert Pre-procedure Verification/Time Out Yes - 08:57 Taken: Start Time: 09:00 Bleeding: None End Time: 09:02 Response to Treatment: Procedure was tolerated well Level of Consciousness (Post- Awake and Alert procedure): Post Debridement Measurements of Total Wound Length: (cm) 1.3 Width: (cm) 1.2 Depth: (cm) 0.1 Volume: (cm) 0.123 Character of Wound/Ulcer Post Debridement: Stable Severity of Tissue Post Debridement: Limited to breakdown of skin Post Procedure Diagnosis Same as Pre-procedure Electronic Signature(s) Signed: 08/08/2020 5:53:47 PM By: Lorrin Jackson Signed: 08/08/2020 6:00:42 PM By: Worthy Keeler PA-C Entered By: Lorrin Jackson on 08/08/2020 09:03:41 -------------------------------------------------------------------------------- Debridement Details Patient Name: Date of Service: [name] [name] [facility] THA N L. 08/08/2020 8:00 A M Medical Record Number: [id number] Patient Account Number: [id number] Date of Birth/Sex: Treating  RN: [birth year] (65 y.o. Marcheta Grammes Primary Care Provider: [name] Other Clinician: Referring Provider: Treating Provider/Extender: Gigi Gin in Treatment: 4 Debridement Performed for Assessment: Wound #3 Right,Lateral Foot Performed By: Physician Worthy Keeler, PA Debridement Type: Chemical/Enzymatic/Mechanical Agent Used: Santyl Severity of Tissue Pre Debridement: Fat layer exposed Level of Consciousness (Pre-procedure): Awake and Alert Pre-procedure Verification/Time Out Yes - 08:57 Taken: Start Time: 09:02 Bleeding: None End Time: 09:04 Response to Treatment: Procedure was tolerated well Level of Consciousness (Post-  Awake and Alert procedure): Post Debridement Measurements of Total Wound Length: (cm) 1.3 Width: (cm) 1.2 Depth: (cm) 0.1 Volume: (cm) 0.123 Character of Wound/Ulcer Post Debridement: Stable Severity of Tissue Post Debridement: Fat layer exposed Post Procedure Diagnosis Same as Pre-procedure Electronic Signature(s) Signed: 08/08/2020 5:53:47 PM By: Lorrin Jackson Signed: 08/08/2020 6:00:42 PM By: Worthy Keeler PA-C Entered By: Lorrin Jackson on 08/08/2020 09:04:14 -------------------------------------------------------------------------------- HPI Details Patient Name: Date of Service: [name] [name] [facility] THA N L. 08/08/2020 8:00 A M Medical Record Number: [id number] Patient Account Number: [id number] Date of Birth/Sex: Treating RN: 10-22-1955 (65 y.o. Marcheta Grammes Primary Care Provider: [name] Other Clinician: Referring Provider: Treating Provider/Extender: Gigi Gin in Treatment: 4 History of Present Illness HPI Description: ADMISSION 07/07/2020 This is a 65 year old man who is a type II diabetic. I have reviewed this in epic. It appeared that his diabetes was diagnosed in 2009 at which time he was on metformin. He was followed for a period of time by an endocrinologist with [facility] to  always labeled him as a type II diabetic. Recently in epic there is been a switch to type 1 diabetes. If there were defining antibody test done I do not see them and I think the totality of the evidence would suggest that he is a type II diabetic. His recent problem started in May when he fell down 17 stairs. He suffered a scapular fracture he was hospitalized at [facility] for 9 days and then discharged to [facility]. His mother who is present said that most of these wounds seem to develop surrounding the discharge from hospital or the stay at [facility]. He has an area on his lower sacrum which appears currently to be a stage II but per their description this is actually improved quite a bit. He has an area on the left lateral foot at roughly the base of the fifth metatarsal, the left Achilles heel and the right fifth metatarsal head laterally they have been using topical antibiotics. Seen by primary care yesterday and placed on Keflex The patient had arterial studies on 05/05/2020. I wondered whether this had something to do with wounds on his feet at that time but I have not been able to determine this. At that time his ABI on the right was 1.09 with a TBI of 0.65 on the left 0.88 with a TBI of 0.44. Waveforms were triphasic on the right monophasic on the left. He likely has significant PAD on the left Past medical history includes type 2 diabetes poorly controlled last hemoglobin A1c at 12.7 in May, hypertension, hyperlipidemia, atrial fib on Eliquis, coronary artery disease status post CABG, left scapular fracture in May of this year, AAA repair. Bilateral leg weakness ABIs were not done in our clinic as they were just done in May of this year at [facility]14; patient arrives back in clinic he has areas on the left Achilles heel, base of the left fifth metatarsal, right lateral fifth metatarsal. The area he had over the coccyx appears to be healed but this area remains vulnerable. We have  been using Iodoflex. I rereviewed his ABIs which were done in May. I do not know exactly what prompted these test however they do show a TBI of 0.44 on the left and monophasic waveforms. 7/21; the patient's wounds are about the same. Certainly no healing and the nurses noted some green drainage on the right fifth metatarsal head, left Achilles a little larger. We  have been using silver alginate really without any improvement. We did get an appointment with vascular surgery however its that it did the end of August and they seem to want to repeat the noninvasive test that they have already done in May. I am not sure what the issue here is. We advised the patient's sister to call and point to the idea that they have already had noninvasive studies. The real question I have is does this patient need an angiogram. He is a diabetic and it is possible that the ABIs are falsely elevated because of medial calcification. At least on the left his TBI was abnormal 08/08/2020 upon evaluation today patient appears to be doing about the same in regard to his wounds. He still waiting the appointment with vascular. This is something that hopefully should be undertaken shortly. The 29th of this month is when he is actually scheduled although they have put in a call to move this up if at all possible. Fortunately there does not appear to be any signs of active infection at this time. No fevers, chills, nausea, vomiting, or diarrhea. Electronic Signature(s) Signed: 08/08/2020 9:02:12 AM By: Worthy Keeler PA-C Entered By: Worthy Keeler on 08/08/2020 09:02:12 -------------------------------------------------------------------------------- Physical Exam Details Patient Name: Date of Service: [name] [name] [facility] THA N L. 08/08/2020 8:00 A M Medical Record Number: [id number] Patient Account Number: [id number] Date of Birth/Sex: Treating RN: [birth year] (65 y.o. Marcheta Grammes Primary Care Provider: [name] Other  Clinician: Referring Provider: Treating Provider/Extender: Gigi Gin in Treatment: 4 Constitutional Well-nourished and well-hydrated in no acute distress. Respiratory normal breathing without difficulty. Psychiatric this patient is able to make decisions and demonstrates good insight into disease process. Alert and Oriented x 3. pleasant and cooperative. Notes Upon inspection patient's wound bed actually showed signs of good granulation epithelization at this point. There does not appear to be any evidence of active infection which is great news. With that being said there is a significant amount of necrotic tissue I do think Santyl could be helpful in helping to loosen up some of this dead tissue. I Am going to send in a prescription today. Electronic Signature(s) Signed: 08/08/2020 9:02:43 AM By: Worthy Keeler PA-C Entered By: Worthy Keeler on 08/08/2020 09:02:43 -------------------------------------------------------------------------------- Physician Orders Details Patient Name: Date of Service: [name] [name] [facility] THA N L. 08/08/2020 8:00 A M Medical Record Number: [id number] Patient Account Number: [id number] Date of Birth/Sex: Treating RN: [birth year] (65 y.o. Marcheta Grammes Primary Care Provider: [name] Other Clinician: Referring Provider: Treating Provider/Extender: Gigi Gin in Treatment: 4 Verbal / Phone Orders: No Diagnosis Coding ICD-10 Coding Code Description [id number] Type 2 diabetes mellitus with foot ulcer L97.528 Non-pressure chronic ulcer of other part of left foot with other specified severity L97.511 Non-pressure chronic ulcer of other part of right foot limited to breakdown of skin L89.622 Pressure ulcer of left heel, stage 2 E11.42 Type 2 diabetes mellitus with diabetic polyneuropathy Follow-up Appointments Return appointment in 1 month. - Dr. Dellia Nims Other: - [facility] appt 08/29/2020. Bathing/  Shower/ Hygiene May shower and wash wound with soap and water. - with dressing changes only. Off-Loading Turn and reposition every 2 hours Other: - float heels while resting in bed or chair. Use a pillow or rolled sheet under legs to offload the heels. Home Health New wound care orders this week; continue Home Health for wound care. May utilize formulary equivalent dressing  for wound treatment orders unless otherwise specified. - Santyl Ointment added Other Home Health Orders/Instructions: - [facility] home health twice a week and family once a week. Wound Treatment Wound #1 - Calcaneus Wound Laterality: Left Cleanser: Soap and Water (Home Health) 3 x Per Week/30 Days Discharge Instructions: May shower and wash wound with dial antibacterial soap and water prior to dressing change. Cleanser: Wound Cleanser (Home Health) 3 x Per Week/30 Days Discharge Instructions: Cleanse the wound with wound cleanser prior to applying a clean dressing using gauze sponges, not tissue or cotton balls. Prim Dressing: Hydrofera Blue Classic Foam, 4x4 in (Home Health) 3 x Per Week/30 Days ary Discharge Instructions: Moisten with saline prior to applying to wound bed Prim Dressing: Santyl Ointment ([place]) 3 x Per Week/30 Days ary Discharge Instructions: Apply nickel thick amount to wound bed as instructed Secondary Dressing: Woven Gauze Sponge, Non-Sterile 4x4 in (Home Health) 3 x Per Week/30 Days Discharge Instructions: Apply over primary dressing as directed. Secondary Dressing: ALLEVYN Heel 4 1/2in x 5 1/2in / 10.5cm x 13.5cm (Home Health) 3 x Per Week/30 Days Discharge Instructions: Apply over primary dressing as directed. Secured With: [employer], 4.5x3.1 (in/yd) (Home Health) 3 x Per Week/30 Days Discharge Instructions: Secure with Kerlix as directed. Secured With: 28M Medipore H Soft Cloth Surgical T 4 x 2 (in/yd) (Home Health) 3 x Per Week/30 Days ape Discharge Instructions: Secure dressing  with tape as directed. Wound #2 - Foot Wound Laterality: Left, Lateral Cleanser: Soap and Water (Home Health) 3 x Per Week/30 Days Discharge Instructions: May shower and wash wound with dial antibacterial soap and water prior to dressing change. Cleanser: Wound Cleanser (Home Health) 3 x Per Week/30 Days Discharge Instructions: Cleanse the wound with wound cleanser prior to applying a clean dressing using gauze sponges, not tissue or cotton balls. Prim Dressing: Hydrofera Blue Classic Foam, 4x4 in (Home Health) 3 x Per Week/30 Days ary Discharge Instructions: Moisten with saline prior to applying to wound bed Prim Dressing: Santyl Ointment ([place]) 3 x Per Week/30 Days ary Discharge Instructions: Apply nickel thick amount to wound bed as instructed Secondary Dressing: Woven Gauze Sponge, Non-Sterile 4x4 in (Home Health) 3 x Per Week/30 Days Discharge Instructions: Apply over primary dressing as directed. Secured With: [employer], 4.5x3.1 (in/yd) (Home Health) 3 x Per Week/30 Days Discharge Instructions: Secure with Kerlix as directed. Secured With: 28M Medipore H Soft Cloth Surgical T 4 x 2 (in/yd) (Home Health) 3 x Per Week/30 Days ape Discharge Instructions: Secure dressing with tape as directed. Wound #3 - Foot Wound Laterality: Right, Lateral Cleanser: Soap and Water (Home Health) 3 x Per Week/30 Days Discharge Instructions: May shower and wash wound with dial antibacterial soap and water prior to dressing change. Cleanser: Wound Cleanser (Home Health) 3 x Per Week/30 Days Discharge Instructions: Cleanse the wound with wound cleanser prior to applying a clean dressing using gauze sponges, not tissue or cotton balls. Prim Dressing: Hydrofera Blue Classic Foam, 4x4 in (Home Health) 3 x Per Week/30 Days ary Discharge Instructions: Moisten with saline prior to applying to wound bed Prim Dressing: Santyl Ointment ([place]) 3 x Per Week/30 Days ary Discharge Instructions:  Apply nickel thick amount to wound bed as instructed Secondary Dressing: Woven Gauze Sponge, Non-Sterile 4x4 in (Home Health) 3 x Per Week/30 Days Discharge Instructions: Apply over primary dressing as directed. Secured With: [employer], 4.5x3.1 (in/yd) (Home Health) 3 x Per Week/30 Days Discharge Instructions: Secure with Kerlix  as directed. Secured With: 62M Medipore H Soft Cloth Surgical T 4 x 2 (in/yd) (Home Health) 3 x Per Week/30 Days ape Discharge Instructions: Secure dressing with tape as directed. Patient Medications llergies: No Known Allergies A Notifications Medication Indication Start End 08/08/2020 Santyl DOSE topical 250 unit/gram ointment - ointment topical Apply nickel thick daily to the wound bed and then cover with a dressing as directed in clinic x 30 days Electronic Signature(s) Signed: 08/08/2020 9:05:34 AM By: Worthy Keeler PA-C Entered By: Worthy Keeler on 08/08/2020 09:05:33 -------------------------------------------------------------------------------- Problem List Details Patient Name: Date of Service: [name] [name] [facility] THA N L. 08/08/2020 8:00 A M Medical Record Number: [id number] Patient Account Number: [id number] Date of Birth/Sex: Treating RN: Oct 07, 1955 (65 y.o. Marcheta Grammes Primary Care Provider: [name] Other Clinician: Referring Provider: Treating Provider/Extender: Gigi Gin in Treatment: 4 Active Problems ICD-10 Encounter Code Description Active Date MDM Diagnosis E11.621 Type 2 diabetes mellitus with foot ulcer 07/07/2020 No Yes L97.528 Non-pressure chronic ulcer of other part of left foot with other specified 07/07/2020 No Yes severity L97.511 Non-pressure chronic ulcer of other part of right foot limited to breakdown of 07/07/2020 No Yes skin L89.622 Pressure ulcer of left heel, stage 2 07/07/2020 No Yes E11.42 Type 2 diabetes mellitus with diabetic polyneuropathy 07/07/2020 No Yes Inactive  Problems ICD-10 Code Description Active Date Inactive Date L89.152 Pressure ulcer of sacral region, stage 2 07/07/2020 07/07/2020 Resolved Problems Electronic Signature(s) Signed: 08/08/2020 8:38:38 AM By: Worthy Keeler PA-C Previous Signature: 08/08/2020 8:01:49 AM Version By: Lorrin Jackson Entered By: Worthy Keeler on 08/08/2020 08:38:38 -------------------------------------------------------------------------------- Progress Note Details Patient Name: Date of Service: [name] [name] [facility] THA N L. 08/08/2020 8:00 A M Medical Record Number: [id number] Patient Account Number: [id number] Date of Birth/Sex: Treating RN: Jun 22, 1955 (65 y.o. Marcheta Grammes Primary Care Provider: [name] Other Clinician: Referring Provider: Treating Provider/Extender: Gigi Gin in Treatment: 4 Subjective Chief Complaint Information obtained from Patient 07/07/2020; patient is in clinic for today for review of pressure ulcers on his coccyx and left heel and diabetic foot ulcers bilaterally. History of Present Illness (HPI) ADMISSION 07/07/2020 This is a 65 year old man who is a type II diabetic. I have reviewed this in epic. It appeared that his diabetes was diagnosed in 2009 at which time he was on metformin. He was followed for a period of time by an endocrinologist with [facility] to always labeled him as a type II diabetic. Recently in epic there is been a switch to type 1 diabetes. If there were defining antibody test done I do not see them and I think the totality of the evidence would suggest that he is a type II diabetic. His recent problem started in May when he fell down 17 stairs. He suffered a scapular fracture he was hospitalized at [facility] for 9 days and then discharged to [facility]. His mother who is present said that most of these wounds seem to develop surrounding the discharge from hospital or the stay at [facility]. He has an area on his lower sacrum which appears  currently to be a stage II but per their description this is actually improved quite a bit. He has an area on the left lateral foot at roughly the base of the fifth metatarsal, the left Achilles heel and the right fifth metatarsal head laterally they have been using topical antibiotics. Seen by primary care yesterday and placed on Keflex The  patient had arterial studies on 05/05/2020. I wondered whether this had something to do with wounds on his feet at that time but I have not been able to determine this. At that time his ABI on the right was 1.09 with a TBI of 0.65 on the left 0.88 with a TBI of 0.44. Waveforms were triphasic on the right monophasic on the left. He likely has significant PAD on the left Past medical history includes type 2 diabetes poorly controlled last hemoglobin A1c at 12.7 in May, hypertension, hyperlipidemia, atrial fib on Eliquis, coronary artery disease status post CABG, left scapular fracture in May of this year, AAA repair. Bilateral leg weakness ABIs were not done in our clinic as they were just done in May of this year at vein and vascular 7/14; patient arrives back in clinic he has areas on the left Achilles heel, base of the left fifth metatarsal, right lateral fifth metatarsal. The area he had over the coccyx appears to be healed but this area remains vulnerable. We have been using Iodoflex. I rereviewed his ABIs which were done in May. I do not know exactly what prompted these test however they do show a TBI of 0.44 on the left and monophasic waveforms. 7/21; the patient's wounds are about the same. Certainly no healing and the nurses noted some green drainage on the right fifth metatarsal head, left Achilles a little larger. We have been using silver alginate really without any improvement. We did get an appointment with vascular surgery however its that it did the end of August and they seem to want to repeat the noninvasive test that they have already done in May.  I am not sure what the issue here is. We advised the patient's sister to call and point to the idea that they have already had noninvasive studies. The real question I have is does this patient need an angiogram. He is a diabetic and it is possible that the ABIs are falsely elevated because of medial calcification. At least on the left his TBI was abnormal 08/08/2020 upon evaluation today patient appears to be doing about the same in regard to his wounds. He still waiting the appointment with vascular. This is something that hopefully should be undertaken shortly. The 29th of this month is when he is actually scheduled although they have put in a call to move this up if at all possible. Fortunately there does not appear to be any signs of active infection at this time. No fevers, chills, nausea, vomiting, or diarrhea. Objective Constitutional Well-nourished and well-hydrated in no acute distress. Vitals Time Taken: 8:08 AM, Height: 74 in, Source: Stated, Weight: 151 lbs, Source: Stated, BMI: 19.4, Temperature: 98.4 F, Pulse: 60 bpm, Respiratory Rate: 18 breaths/min, Blood Pressure: 80/50 mmHg, Capillary Blood Glucose: 459 mg/dl. General Notes: has not checked blood sugar in 2 days, BP left arm 89/60. Pt states he feels very weak this morning. glucose checked in clinic Respiratory normal breathing without difficulty. Psychiatric this patient is able to make decisions and demonstrates good insight into disease process. Alert and Oriented x 3. pleasant and cooperative. General Notes: Upon inspection patient's wound bed actually showed signs of good granulation epithelization at this point. There does not appear to be any evidence of active infection which is great news. With that being said there is a significant amount of necrotic tissue I do think Santyl could be helpful in helping to loosen up some of this dead tissue. I Am going to send in  a prescription today. Integumentary (Hair, Skin) Wound  #1 status is Open. Original cause of wound was Pressure Injury. The date acquired was: 05/23/2020. The wound has been in treatment 4 weeks. The wound is located on the Left Calcaneus. The wound measures 3.4cm length x 2.2cm width x 0.1cm depth; 5.875cm^2 area and 0.587cm^3 volume. There is Fat Layer (Subcutaneous Tissue) exposed. There is no tunneling or undermining noted. There is a medium amount of serosanguineous drainage noted. The wound margin is flat and intact. There is small (1-33%) pink granulation within the wound bed. There is a large (67-100%) amount of necrotic tissue within the wound bed including Adherent Slough. Wound #2 status is Open. Original cause of wound was Gradually Appeared. The date acquired was: 06/21/2020. The wound has been in treatment 4 weeks. The wound is located on the Left,Lateral Foot. The wound measures 1.3cm length x 1.2cm width x 0.1cm depth; 1.225cm^2 area and 0.123cm^3 volume. There is Fat Layer (Subcutaneous Tissue) exposed. There is no tunneling or undermining noted. There is a medium amount of purulent drainage noted. The wound margin is distinct with the outline attached to the wound base. There is small (1-33%) red granulation within the wound bed. There is a large (67-100%) amount of necrotic tissue within the wound bed including Adherent Slough. Wound #3 status is Open. Original cause of wound was Gradually Appeared. The date acquired was: 06/21/2020. The wound has been in treatment 4 weeks. The wound is located on the Right,Lateral Foot. The wound measures 1.3cm length x 1.2cm width x 0.1cm depth; 1.225cm^2 area and 0.123cm^3 volume. There is Fat Layer (Subcutaneous Tissue) exposed. There is no tunneling or undermining noted. There is a medium amount of purulent drainage noted. The wound margin is distinct with the outline attached to the wound base. There is small (1-33%) red granulation within the wound bed. There is a large (67-100%) amount of necrotic  tissue within the wound bed including Adherent Slough. 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 There was a Chemical/Enzymatic/Mechanical debridement performed by Worthy Keeler, PA.Marland Kitchen Agent used was [employer]. A time out was conducted at 08:57, prior to the start of the procedure. There was no bleeding. The procedure was tolerated well. Post Debridement Measurements: 3.4cm length x 2.2cm width x 0.1cm depth; 0.587cm^3 volume. Post debridement Stage noted as Category/Stage III. Character of Wound/Ulcer Post Debridement is stable. Post procedure Diagnosis Wound #1: Same as Pre-Procedure Wound #2 Pre-procedure diagnosis of Wound #2 is a Diabetic Wound/Ulcer of the Lower Extremity located on the Left,Lateral Foot .Severity of Tissue Pre Debridement is: Fat layer exposed. There was a Chemical/Enzymatic/Mechanical debridement performed by Worthy Keeler, PA.Marland Kitchen Agent used was [employer]. A time out was conducted at 08:57, prior to the start of the procedure. There was no bleeding. The procedure was tolerated well. Post Debridement Measurements: 1.3cm length x 1.2cm width x 0.1cm depth; 0.123cm^3 volume. Character of Wound/Ulcer Post Debridement is stable. Severity of Tissue Post Debridement is: Limited to breakdown of skin. Post procedure Diagnosis Wound #2: Same as Pre-Procedure Wound #3 Pre-procedure diagnosis of Wound #3 is a Diabetic Wound/Ulcer of the Lower Extremity located  on the Right,Lateral Foot .Severity of Tissue Pre Debridement is: Fat layer exposed. There was a Chemical/Enzymatic/Mechanical  debridement performed by Worthy Keeler, PA.Marland Kitchen Agent used was [employer]. A time out was conducted at 08:57, prior to the start of the procedure. There was no bleeding. The procedure was tolerated well. Post Debridement Measurements: 1.3cm length x 1.2cm width x 0.1cm depth; 0.123cm^3 volume. Character of Wound/Ulcer Post Debridement is stable. Severity of Tissue Post Debridement is: Fat layer exposed. Post procedure Diagnosis Wound #3: Same as Pre-Procedure Plan Follow-up Appointments: Return appointment in 1 month. - Dr. Dellia Nims Other: - [facility] appt 08/29/2020. Bathing/ Shower/ Hygiene: May shower and wash wound with soap and water. - with dressing changes only. Off-Loading: Turn and reposition every 2 hours Other: - float heels while resting in bed or chair. Use a pillow or rolled sheet under legs to offload the heels. Home Health: New wound care orders this week; continue Home Health for wound care. May utilize formulary equivalent dressing for wound treatment orders unless otherwise specified. - Santyl Ointment added Other Home Health Orders/Instructions: - [facility] home health twice a week and family once a week. The following medication(s) was prescribed: Santyl topical 250 unit/gram ointment ointment topical Apply nickel thick daily to the wound bed and then cover with a dressing as directed in clinic x 30 days starting 08/08/2020 WOUND #1: - Calcaneus Wound Laterality: Left Cleanser: Soap and Water ([place]) 3 x Per Week/30 Days Discharge Instructions: May shower and wash wound with dial antibacterial soap and water prior to dressing change. Cleanser: Wound Cleanser (Home Health) 3 x Per Week/30 Days Discharge Instructions: Cleanse the wound with wound cleanser prior to applying a clean dressing using gauze sponges, not tissue or cotton balls. Prim Dressing: Hydrofera Blue Classic Foam, 4x4 in (Home Health) 3 x Per Week/30 Days ary Discharge Instructions: Moisten with  saline prior to applying to wound bed Prim Dressing: Santyl Ointment ([place]) 3 x Per Week/30 Days ary Discharge Instructions: Apply nickel thick amount to wound bed as instructed Secondary Dressing: Woven Gauze Sponge, Non-Sterile 4x4 in (Home Health) 3 x Per Week/30 Days Discharge Instructions: Apply over primary dressing as directed. Secondary Dressing: ALLEVYN Heel 4 1/2in x 5 1/2in / 10.5cm x 13.5cm (Home Health) 3 x Per Week/30 Days Discharge Instructions: Apply over primary dressing as directed. Secured With: [employer], 4.5x3.1 (in/yd) (Home Health) 3 x Per Week/30 Days Discharge Instructions: Secure with Kerlix as directed. Secured With: 61M Medipore H Soft Cloth Surgical T 4 x 2 (in/yd) (Home Health) 3 x Per Week/30 Days ape Discharge Instructions: Secure dressing with tape as directed. WOUND #2: - Foot Wound Laterality: Left, Lateral Cleanser: Soap and Water (Home Health) 3 x Per Week/30 Days Discharge Instructions: May shower and wash wound with dial antibacterial soap and water prior to dressing change. Cleanser: Wound Cleanser (Home Health) 3 x Per Week/30 Days Discharge Instructions: Cleanse the wound with wound cleanser prior to applying a clean dressing using gauze sponges, not tissue or cotton balls. Prim Dressing: Hydrofera Blue Classic Foam, 4x4 in (Home Health) 3 x Per Week/30 Days ary Discharge Instructions: Moisten with saline prior to applying to wound bed Prim Dressing: Santyl Ointment ([place]) 3 x Per Week/30 Days ary Discharge Instructions: Apply nickel thick amount to wound bed as instructed Secondary Dressing: Woven Gauze Sponge, Non-Sterile 4x4 in (Home Health) 3 x Per Week/30 Days Discharge Instructions: Apply over primary dressing as directed. Secured With: [employer], 4.5x3.1 (  in/yd) ([place]) 3 x Per Week/30 Days Discharge Instructions: Secure with Kerlix as directed. Secured With: 30M Medipore H Soft Cloth Surgical T 4 x 2  (in/yd) (Home Health) 3 x Per Week/30 Days ape Discharge Instructions: Secure dressing with tape as directed. WOUND #3: - Foot Wound Laterality: Right, Lateral Cleanser: Soap and Water (Home Health) 3 x Per Week/30 Days Discharge Instructions: May shower and wash wound with dial antibacterial soap and water prior to dressing change. Cleanser: Wound Cleanser (Home Health) 3 x Per Week/30 Days Discharge Instructions: Cleanse the wound with wound cleanser prior to applying a clean dressing using gauze sponges, not tissue or cotton balls. Prim Dressing: Hydrofera Blue Classic Foam, 4x4 in (Home Health) 3 x Per Week/30 Days ary Discharge Instructions: Moisten with saline prior to applying to wound bed Prim Dressing: Santyl Ointment ([place]) 3 x Per Week/30 Days ary Discharge Instructions: Apply nickel thick amount to wound bed as instructed Secondary Dressing: Woven Gauze Sponge, Non-Sterile 4x4 in (Home Health) 3 x Per Week/30 Days Discharge Instructions: Apply over primary dressing as directed. Secured With: [employer], 4.5x3.1 (in/yd) (Home Health) 3 x Per Week/30 Days Discharge Instructions: Secure with Kerlix as directed. Secured With: 30M Medipore H Soft Cloth Surgical T 4 x 2 (in/yd) (Home Health) 3 x Per Week/30 Days ape Discharge Instructions: Secure dressing with tape as directed. 1. Based on what I am seeing currently I would recommend that we continue with the [facility] I think this is probably still the best way to go. The patient and his sister are in agreement with the plan. 2. I am also can recommend that we have the patient go ahead and continue to monitor for any signs of worsening or infection. Obviously right now the biggest issue I see is that the patient is going to require some debridement but again we need to ensure that his arterial flow is sufficient to be able to support this. I do not want to prematurely proceed with aggressive debridement only to  find out that his arterial function is not sufficient to support this. Patient and his sister voiced understanding. 3. I will however go ahead and send in a prescription for Santyl for the patient today to help out. We will see patient back for reevaluation in 1 week here in the clinic. If anything worsens or changes patient will contact our office for additional recommendations. Electronic Signature(s) Signed: 08/08/2020 9:06:13 AM By: Worthy Keeler PA-C Entered By: Worthy Keeler on 08/08/2020 09:06:13 -------------------------------------------------------------------------------- SuperBill Details Patient Name: Date of Service: [name] [name] HNA THA N L. 08/08/2020 Medical Record Number: [id number] Patient Account Number: [id number] Date of Birth/Sex: Treating RN: Mar 08, 1955 (65 y.o. Marcheta Grammes Primary Care Provider: [name] Other Clinician: Referring Provider: Treating Provider/Extender: Gigi Gin in Treatment: 4 Diagnosis Coding ICD-10 Codes Code Description [phone] Type 2 diabetes mellitus with foot ulcer L97.528 Non-pressure chronic ulcer of other part of left foot with other specified severity L97.511 Non-pressure chronic ulcer of other part of right foot limited to breakdown of skin L89.622 Pressure ulcer of left heel, stage 2 E11.42 Type 2 diabetes mellitus with diabetic polyneuropathy Facility Procedures CPT4 Code: [id number] Description: [phone] - DEBRIDE W/O ANES NON SELECT ICD-10 Diagnosis Description L97.528 Non-pressure chronic ulcer of other part of left foot with other specified seve L97.511 Non-pressure chronic ulcer of other part of right foot limited to breakdown of Modifier: rity skin Quantity: 1 CPT4 Code: [id number] Description:  [id number] - DEBRIDE W/O ANES NON SELECT ICD-10 Diagnosis Description L97.528 Non-pressure chronic ulcer of other part of left foot with other specified seve L97.511 Non-pressure chronic ulcer of other part  of right foot limited to breakdown of Modifier: rity skin Quantity: 1 CPT4 Code: [id number] Description: [id number] - DEBRIDE W/O ANES NON SELECT ICD-10 Diagnosis Description L97.528 Non-pressure chronic ulcer of other part of left foot with other specified seve L97.511 Non-pressure chronic ulcer of other part of right foot limited to breakdown of Modifier: rity skin Quantity: 1 Physician Procedures : CPT4 Code Description Modifier [id number] - WC PHYS LEVEL 4 - EST PT 25 ICD-10 Diagnosis Description E11.621 Type 2 diabetes mellitus with foot ulcer L97.528 Non-pressure chronic ulcer of other part of left foot with other specified severity L97.511  Non-pressure chronic ulcer of other part of right foot limited to breakdown of skin L89.622 Pressure ulcer of left heel, stage 2 Quantity: 1 Electronic Signature(s) Signed: 08/08/2020 9:06:29 AM By: Worthy Keeler PA-C Entered By: Worthy Keeler on 08/08/2020 09:06:29

## 2020-08-08 NOTE — Progress Notes (Addendum)
[name] [name], [name] [name] ([id number]) Visit Report for 08/08/2020 Arrival Information Details Patient Name: Date of Service: [name] [name] [name] [name] THA N L. 08/08/2020 8:00 A M Medical Record Number: [id number] Patient Account Number: [id number] Date of Birth/Sex: Treating RN: 1955/10/03 (65 y.o. [name] [name] Primary Care [name] [name]: [name] [name] Other Clinician: Referring Etheleen Valtierra: Treating Evey Mcmahan/Extender: [name] [name] in Treatment: 4 Visit Information History Since Last Visit Added or deleted any medications: No Patient Arrived: Wheel Chair Any new allergies or adverse reactions: No Arrival Time: 08:06 Had a fall or experienced change in No Accompanied By: mother activities of daily living that may affect Transfer Assistance: None risk of falls: Patient Identification Verified: Yes Signs or symptoms of abuse/neglect since last visito No Secondary Verification Process Completed: Yes Hospitalized since last visit: No Patient Requires Transmission-Based Precautions: No Implantable device outside of the clinic excluding No Patient Has Alerts: Yes cellular tissue based products placed in the [name] Patient Alerts: Patient on Blood Thinner since last visit: Has Dressing in Place as Prescribed: Yes Pain Present Now: No Electronic Signature(s) Signed: 08/09/2020 5:39:27 PM By: Baruch Gouty RN, BSN Entered By: Baruch Gouty on 08/08/2020 08:07:59 -------------------------------------------------------------------------------- Encounter Discharge Information Details Patient Name: Date of Service: [name] [name] HNA THA N L. 08/08/2020 8:00 A M Medical Record Number: [id number] Patient Account Number: [id number] Date of Birth/Sex: Treating RN: [birth year] (65 y.o. [name] [name] Primary Care Nekhi Liwanag: [name] [name] Other Clinician: Referring Paityn Balsam: Treating Kayln Garceau/Extender: [name] [name] in Treatment: 4 Encounter Discharge Information Items  Post Procedure Vitals Discharge Condition: Stable Temperature (F): 98.4 Ambulatory Status: Wheelchair Pulse (bpm): 60 Discharge Destination: Home Respiratory Rate (breaths/min): 18 Transportation: Private Auto Blood Pressure (mmHg): 89/60 Accompanied By: mother Schedule Follow-up Appointment: Yes Clinical Summary of Care: Patient Declined Electronic Signature(s) Signed: 08/09/2020 5:39:27 PM By: Baruch Gouty RN, BSN Entered By: Baruch Gouty on 08/08/2020 09:21:46 -------------------------------------------------------------------------------- Lower Extremity Assessment Details Patient Name: Date of Service: [name] [name] HNA THA N L. 08/08/2020 8:00 A M Medical Record Number: [id number] Patient Account Number: [id number] Date of Birth/Sex: Treating RN: 04-20-1955 (65 y.o. [name] [name] Primary Care Emmagrace Runkel: [name] [name] Other Clinician: Referring Pennie Vanblarcom: Treating [place] Hanlon/Extender: [name] [name] in Treatment: 4 Edema Assessment Assessed: [name]: No] [name]: No] Edema: [Left: No] [Right: No] Calf Left: Right: Point of Measurement: 34 cm From Medial Instep 31 cm 30 cm Ankle Left: Right: Point of Measurement: 10 cm From Medial Instep 23.1 cm 22.5 cm Vascular Assessment Pulses: Dorsalis Pedis Palpable: [Left:Yes] [Right:Yes] Electronic Signature(s) Signed: 08/09/2020 5:39:27 PM By: Baruch Gouty RN, BSN Entered By: Baruch Gouty on 08/08/2020 08:21:02 -------------------------------------------------------------------------------- Multi-Disciplinary Care Plan Details Patient Name: Date of Service: [name] [name] HNA THA N L. 08/08/2020 8:00 A M Medical Record Number: [id number] Patient Account Number: [id number] Date of Birth/Sex: Treating RN: 1955-02-08 (65 y.o. Marcheta Grammes Primary Care Laykin Rainone: [name] [name] Other Clinician: Referring Haru Anspaugh: Treating Trystin Hargrove/Extender: [name] [name] in Treatment: 4 Active  Inactive Pain, Acute or Chronic Nursing Diagnoses: Pain, acute or chronic: actual or potential Potential alteration in comfort, pain Goals: Patient will verbalize adequate pain control and receive pain control interventions during procedures as needed Date Initiated: 07/07/2020 Target Resolution Date: 08/25/2020 Goal Status: Active Patient/caregiver will verbalize comfort level met Date Initiated: 07/07/2020 Target Resolution Date: 08/18/2020 Goal Status: Active Interventions: Encourage patient to take pain medications as prescribed Provide education on pain management Reposition patient for comfort Treatment Activities: Administer pain  control measures as ordered : 07/07/2020 Notes: Wound/Skin Impairment Nursing Diagnoses: Knowledge deficit related to ulceration/compromised skin integrity Goals: Patient/caregiver will verbalize understanding of skin care regimen Date Initiated: 07/07/2020 Target Resolution Date: 08/18/2020 Goal Status: Active Interventions: Assess patient/caregiver ability to obtain necessary supplies Assess patient/caregiver ability to perform ulcer/skin care regimen upon admission and as needed Provide education on ulcer and skin care Treatment Activities: Skin care regimen initiated : 07/07/2020 Topical wound management initiated : 07/07/2020 Notes: Electronic Signature(s) Signed: 08/08/2020 8:02:04 AM By: Lorrin [place] Entered By: Lorrin [place] on 08/08/2020 08:02:04 -------------------------------------------------------------------------------- Pain Assessment Details Patient Name: Date of Service: [name] [name] HNA THA N L. 08/08/2020 8:00 A M Medical Record Number: [id number] Patient Account Number: [id number] Date of Birth/Sex: Treating RN: 03/19/1955 (65 y.o. [name] [name] Primary Care Theodoro Koval: [name] [name] Other Clinician: Referring Lashika Erker: Treating Mohan Erven/Extender: [name] [name] in Treatment: 4 Active Problems Location  of Pain Severity and Description of Pain Patient Has Paino No Site Locations Rate the pain. Current Pain Level: 0 Pain Management and Medication Current Pain Management: Electronic Signature(s) Signed: 08/09/2020 5:39:27 PM By: Baruch Gouty RN, BSN Entered By: Baruch Gouty on 08/08/2020 08:20:31 -------------------------------------------------------------------------------- Patient/Caregiver Education Details Patient Name: Date of Service: [name] [name] HNA THA N L. 8/8/2022andnbsp8:00 A M Medical Record Number: [id number] Patient Account Number: [id number] Date of Birth/Gender: Treating RN: [birth year] (65 y.o. Marcheta Grammes Primary Care Physician: [name] [name] Other Clinician: Referring Physician: Treating Physician/Extender: [name] [name] in Treatment: 4 Education Assessment Education Provided To: Patient Education Topics Provided Wound/Skin Impairment: Methods: Explain/Verbal, Printed Responses: State content correctly Electronic Signature(s) Signed: 08/08/2020 5:53:47 PM By: Lorrin [place] Entered By: Lorrin [place] on 08/08/2020 08:03:15 -------------------------------------------------------------------------------- Wound Assessment Details Patient Name: Date of Service: [name] [name] HNA THA N L. 08/08/2020 8:00 A M Medical Record Number: [id number] Patient Account Number: [id number] Date of Birth/Sex: Treating RN: 1955/06/22 (65 y.o. [name], [name] Primary Care Wilburta Milbourn: [name] [name] Other Clinician: Referring Yee Joss: Treating Alexiah Koroma/Extender: [name] [name] in Treatment: 4 Wound Status Wound Number: 1 Primary Pressure Ulcer Etiology: Wound Location: Left Calcaneus Wound Open Wounding Event: Pressure Injury Status: Date Acquired: 05/23/2020 Comorbid Coronary Artery Disease, Hypertension, Type II Diabetes, Weeks Of Treatment: 4 History: Osteoarthritis, Neuropathy Clustered Wound: No Photos Wound  Measurements Length: (cm) 3.4 Width: (cm) 2.2 Depth: (cm) 0.1 Area: (cm) 5.875 Volume: (cm) 0.587 % Reduction in Area: 53.2% % Reduction in Volume: 76.6% Epithelialization: Small (1-33%) Tunneling: No Undermining: No Wound Description Classification: Category/Stage III Wound Margin: Flat and Intact Exudate Amount: Medium Exudate Type: Serosanguineous Exudate Color: red, brown Foul Odor After Cleansing: No Slough/Fibrino Yes Wound Bed Granulation Amount: Small (1-33%) Exposed Structure Granulation Quality: Pink Fascia Exposed: No Necrotic Amount: Large (67-100%) Fat Layer (Subcutaneous Tissue) Exposed: Yes Necrotic Quality: Adherent Slough Tendon Exposed: No Muscle Exposed: No Joint Exposed: No Bone Exposed: No Treatment Notes Wound #1 (Calcaneus) Wound Laterality: Left Cleanser Soap and Water Discharge Instruction: May shower and wash wound with dial antibacterial soap and water prior to dressing change. Wound Cleanser Discharge Instruction: Cleanse the wound with wound cleanser prior to applying a clean dressing using gauze sponges, not tissue or cotton balls. Peri-Wound Care Topical Primary Dressing Hydrofera Blue Classic Foam, 4x4 in Discharge Instruction: Moisten with saline prior to applying to wound bed Santyl Ointment Discharge Instruction: Apply nickel thick amount to wound bed as instructed Secondary Dressing Woven Gauze Sponge, Non-Sterile 4x4 in Discharge Instruction: Apply over primary  dressing as directed. ALLEVYN Heel 4 1/2in x 5 1/2in / 10.5cm x 13.5cm Discharge Instruction: Apply over primary dressing as directed. Secured With [employer], 4.5x3.1 (in/yd) Discharge Instruction: Secure with Kerlix as directed. 16M Medipore H Soft Cloth Surgical T 4 x 2 (in/yd) ape Discharge Instruction: Secure dressing with tape as directed. Compression Wrap Compression Stockings Add-Ons Electronic Signature(s) Signed: 08/08/2020 5:35:12 PM By: [name] Signed: 08/09/2020 5:39:27 PM By: Baruch Gouty RN, BSN Entered By: Baruch Gouty on 08/08/2020 25:42:70 -------------------------------------------------------------------------------- Wound Assessment Details Patient Name: Date of Service: [name] [name] HNA THA N L. 08/08/2020 8:00 A M Medical Record Number: [id number] Patient Account Number: [id number] Date of Birth/Sex: Treating RN: 04/28/1955 (66 y.o. [name], [name] Primary Care Mindie Rawdon: [name] [name] Other Clinician: Referring Tobey Schmelzle: Treating Athol Bolds/Extender: [name] [name] in Treatment: 4 Wound Status Wound Number: 2 Primary Diabetic Wound/Ulcer of the Lower Extremity Etiology: Wound Location: Left, Lateral Foot Wound Open Wounding Event: Gradually Appeared Status: Date Acquired: 06/21/2020 Comorbid Coronary Artery Disease, Hypertension, Type II Diabetes, Weeks Of Treatment: 4 History: Osteoarthritis, Neuropathy Clustered Wound: No Photos Wound Measurements Length: (cm) 1.3 Width: (cm) 1.2 Depth: (cm) 0.1 Area: (cm) 1.225 Volume: (cm) 0.123 % Reduction in Area: -73.3% % Reduction in Volume: -73.2% Epithelialization: None Tunneling: No Undermining: No Wound Description Classification: Grade 1 Wound Margin: Distinct, outline attached Exudate Amount: Medium Exudate Type: Purulent Exudate Color: yellow, brown, green Foul Odor After Cleansing: No Slough/Fibrino No Wound Bed Granulation Amount: Small (1-33%) Exposed Structure Granulation Quality: Red Fascia Exposed: No Necrotic Amount: Large (67-100%) Fat Layer (Subcutaneous Tissue) Exposed: Yes Necrotic Quality: Adherent Slough Tendon Exposed: No Muscle Exposed: No Joint Exposed: No Bone Exposed: No Treatment Notes Wound #2 (Foot) Wound Laterality: Left, Lateral Cleanser Soap and Water Discharge Instruction: May shower and wash wound with dial antibacterial soap and water prior to dressing change. Wound  Cleanser Discharge Instruction: Cleanse the wound with wound cleanser prior to applying a clean dressing using gauze sponges, not tissue or cotton balls. Peri-Wound Care Topical Primary Dressing Hydrofera Blue Classic Foam, 4x4 in Discharge Instruction: Moisten with saline prior to applying to wound bed Santyl Ointment Discharge Instruction: Apply nickel thick amount to wound bed as instructed Secondary Dressing Woven Gauze Sponge, Non-Sterile 4x4 in Discharge Instruction: Apply over primary dressing as directed. Secured With [employer], 4.5x3.1 (in/yd) Discharge Instruction: Secure with Kerlix as directed. 16M Medipore H Soft Cloth Surgical T 4 x 2 (in/yd) ape Discharge Instruction: Secure dressing with tape as directed. Compression Wrap Compression Stockings Add-Ons Electronic Signature(s) Signed: 08/08/2020 5:35:12 PM By: [name] Signed: 08/09/2020 5:39:27 PM By: Baruch Gouty RN, BSN Entered By: Baruch Gouty on 08/08/2020 08:21:55 -------------------------------------------------------------------------------- Wound Assessment Details Patient Name: Date of Service: [name] [name] HNA THA N L. 08/08/2020 8:00 A M Medical Record Number: [id number] Patient Account Number: [id number] Date of Birth/Sex: Treating RN: August 17, 1955 (65 y.o. [name], [name] Primary Care Ameka Krigbaum: [name] [name] Other Clinician: Referring Domnick Chervenak: Treating Torryn Fiske/Extender: [name] [name] in Treatment: 4 Wound Status Wound Number: 3 Primary Diabetic Wound/Ulcer of the Lower Extremity Etiology: Wound Location: Right, Lateral Foot Wound Open Wounding Event: Gradually Appeared Status: Date Acquired: 06/21/2020 Comorbid Coronary Artery Disease, Hypertension, Type II Diabetes, Weeks Of Treatment: 4 History: Osteoarthritis, Neuropathy Clustered Wound: No Photos Wound Measurements Length: (cm) 1.3 Width: (cm) 1.2 Depth: (cm) 0.1 Area: (cm) 1.225 Volume: (cm)  0.123 % Reduction in Area: 38.8% % Reduction in Volume: 69.3% Epithelialization: Small (  1-33%) Tunneling: No Undermining: No Wound Description Classification: Grade 2 Wound Margin: Distinct, outline attached Exudate Amount: Medium Exudate Type: Purulent Exudate Color: yellow, brown, green Foul Odor After Cleansing: No Slough/Fibrino Yes Wound Bed Granulation Amount: Small (1-33%) Exposed Structure Granulation Quality: Red Fascia Exposed: No Necrotic Amount: Large (67-100%) Fat Layer (Subcutaneous Tissue) Exposed: Yes Necrotic Quality: Adherent Slough Tendon Exposed: No Muscle Exposed: No Joint Exposed: No Bone Exposed: No Treatment Notes Wound #3 (Foot) Wound Laterality: Right, Lateral Cleanser Soap and Water Discharge Instruction: May shower and wash wound with dial antibacterial soap and water prior to dressing change. Wound Cleanser Discharge Instruction: Cleanse the wound with wound cleanser prior to applying a clean dressing using gauze sponges, not tissue or cotton balls. Peri-Wound Care Topical Primary Dressing Hydrofera Blue Classic Foam, 4x4 in Discharge Instruction: Moisten with saline prior to applying to wound bed Santyl Ointment Discharge Instruction: Apply nickel thick amount to wound bed as instructed Secondary Dressing Woven Gauze Sponge, Non-Sterile 4x4 in Discharge Instruction: Apply over primary dressing as directed. Secured With [employer], 4.5x3.1 (in/yd) Discharge Instruction: Secure with Kerlix as directed. 32M Medipore H Soft Cloth Surgical T 4 x 2 (in/yd) ape Discharge Instruction: Secure dressing with tape as directed. Compression Wrap Compression Stockings Add-Ons Electronic Signature(s) Signed: 08/08/2020 5:35:12 PM By: [name] Signed: 08/09/2020 5:39:27 PM By: Baruch Gouty RN, BSN Entered By: Baruch Gouty on 08/08/2020 08:22:20 -------------------------------------------------------------------------------- Vitals  Details Patient Name: Date of Service: [name] [name] HNA THA N L. 08/08/2020 8:00 A M Medical Record Number: [id number] Patient Account Number: [id number] Date of Birth/Sex: Treating RN: [birth year] (65 y.o. [name] [name] Primary Care Darcy Cordner: [name] [name] Other Clinician: Referring Jaydah Stahle: Treating Martino Tompson/Extender: [name] [name] in Treatment: 4 Vital Signs Time Taken: 08:08 Temperature (F): 98.4 Height (in): 74 Pulse (bpm): 60 Source: Stated Respiratory Rate (breaths/min): 18 Weight (lbs): 151 Blood Pressure (mmHg): 80/50 Source: Stated Capillary Blood Glucose (mg/dl): 459 Body Mass Index (BMI): 19.4 Reference Range: 80 - 120 mg / dl Notes has not checked blood sugar in 2 days, BP left arm 89/60. Pt states he feels very weak this morning. glucose checked in clinic Electronic Signature(s) Signed: 08/09/2020 5:39:27 PM By: Baruch Gouty RN, BSN Entered By: Baruch Gouty on 08/08/2020 75:10:25

## 2020-08-09 ENCOUNTER — Telehealth: Payer: Self-pay | Admitting: Internal Medicine

## 2020-08-09 DIAGNOSIS — I10 Essential (primary) hypertension: Secondary | ICD-10-CM | POA: Diagnosis not present

## 2020-08-09 DIAGNOSIS — F3341 Major depressive disorder, recurrent, in partial remission: Secondary | ICD-10-CM | POA: Diagnosis not present

## 2020-08-09 DIAGNOSIS — L89150 Pressure ulcer of sacral region, unstageable: Secondary | ICD-10-CM | POA: Diagnosis not present

## 2020-08-09 DIAGNOSIS — Z79891 Long term (current) use of opiate analgesic: Secondary | ICD-10-CM | POA: Diagnosis not present

## 2020-08-09 DIAGNOSIS — L97429 Non-pressure chronic ulcer of left heel and midfoot with unspecified severity: Secondary | ICD-10-CM | POA: Diagnosis not present

## 2020-08-09 DIAGNOSIS — N401 Enlarged prostate with lower urinary tract symptoms: Secondary | ICD-10-CM | POA: Diagnosis not present

## 2020-08-09 DIAGNOSIS — F419 Anxiety disorder, unspecified: Secondary | ICD-10-CM | POA: Diagnosis not present

## 2020-08-09 DIAGNOSIS — L97529 Non-pressure chronic ulcer of other part of left foot with unspecified severity: Secondary | ICD-10-CM | POA: Diagnosis not present

## 2020-08-09 DIAGNOSIS — G8929 Other chronic pain: Secondary | ICD-10-CM | POA: Diagnosis not present

## 2020-08-09 DIAGNOSIS — L97519 Non-pressure chronic ulcer of other part of right foot with unspecified severity: Secondary | ICD-10-CM | POA: Diagnosis not present

## 2020-08-09 DIAGNOSIS — K5904 Chronic idiopathic constipation: Secondary | ICD-10-CM | POA: Diagnosis not present

## 2020-08-09 DIAGNOSIS — E1142 Type 2 diabetes mellitus with diabetic polyneuropathy: Secondary | ICD-10-CM | POA: Diagnosis not present

## 2020-08-09 DIAGNOSIS — I480 Paroxysmal atrial fibrillation: Secondary | ICD-10-CM | POA: Diagnosis not present

## 2020-08-09 DIAGNOSIS — L409 Psoriasis, unspecified: Secondary | ICD-10-CM | POA: Diagnosis not present

## 2020-08-09 DIAGNOSIS — L89154 Pressure ulcer of sacral region, stage 4: Secondary | ICD-10-CM | POA: Diagnosis not present

## 2020-08-09 DIAGNOSIS — M1990 Unspecified osteoarthritis, unspecified site: Secondary | ICD-10-CM | POA: Diagnosis not present

## 2020-08-09 DIAGNOSIS — D649 Anemia, unspecified: Secondary | ICD-10-CM | POA: Diagnosis not present

## 2020-08-09 DIAGNOSIS — S42102D Fracture of unspecified part of scapula, left shoulder, subsequent encounter for fracture with routine healing: Secondary | ICD-10-CM | POA: Diagnosis not present

## 2020-08-09 DIAGNOSIS — Z7982 Long term (current) use of aspirin: Secondary | ICD-10-CM | POA: Diagnosis not present

## 2020-08-09 DIAGNOSIS — Z794 Long term (current) use of insulin: Secondary | ICD-10-CM | POA: Diagnosis not present

## 2020-08-09 DIAGNOSIS — E785 Hyperlipidemia, unspecified: Secondary | ICD-10-CM | POA: Diagnosis not present

## 2020-08-09 DIAGNOSIS — N138 Other obstructive and reflux uropathy: Secondary | ICD-10-CM | POA: Diagnosis not present

## 2020-08-09 NOTE — Chronic Care Management (AMB) (Signed)
  Chronic Care Management   Outreach Note  08/09/2020 Name: [name] [name] MRN: [id number] DOB: 09-13-1955  Referred by: Janith Lima, MD Reason for referral : No chief complaint on file.   An unsuccessful telephone outreach was attempted today. The patient was referred to the pharmacist for assistance with care management and care coordination.   Follow Up Plan:   Lauretta Grill Upstream Scheduler

## 2020-08-10 DIAGNOSIS — I480 Paroxysmal atrial fibrillation: Secondary | ICD-10-CM | POA: Diagnosis not present

## 2020-08-10 DIAGNOSIS — D649 Anemia, unspecified: Secondary | ICD-10-CM | POA: Diagnosis not present

## 2020-08-10 DIAGNOSIS — M1990 Unspecified osteoarthritis, unspecified site: Secondary | ICD-10-CM | POA: Diagnosis not present

## 2020-08-10 DIAGNOSIS — F419 Anxiety disorder, unspecified: Secondary | ICD-10-CM | POA: Diagnosis not present

## 2020-08-10 DIAGNOSIS — Z7901 Long term (current) use of anticoagulants: Secondary | ICD-10-CM | POA: Diagnosis not present

## 2020-08-10 DIAGNOSIS — E1142 Type 2 diabetes mellitus with diabetic polyneuropathy: Secondary | ICD-10-CM | POA: Diagnosis not present

## 2020-08-10 DIAGNOSIS — N401 Enlarged prostate with lower urinary tract symptoms: Secondary | ICD-10-CM | POA: Diagnosis not present

## 2020-08-10 DIAGNOSIS — N138 Other obstructive and reflux uropathy: Secondary | ICD-10-CM | POA: Diagnosis not present

## 2020-08-10 DIAGNOSIS — E11621 Type 2 diabetes mellitus with foot ulcer: Secondary | ICD-10-CM | POA: Diagnosis not present

## 2020-08-10 DIAGNOSIS — Z7982 Long term (current) use of aspirin: Secondary | ICD-10-CM | POA: Diagnosis not present

## 2020-08-10 DIAGNOSIS — S42132D Displaced fracture of coracoid process, left shoulder, subsequent encounter for fracture with routine healing: Secondary | ICD-10-CM | POA: Diagnosis not present

## 2020-08-10 DIAGNOSIS — E785 Hyperlipidemia, unspecified: Secondary | ICD-10-CM | POA: Diagnosis not present

## 2020-08-10 DIAGNOSIS — G8929 Other chronic pain: Secondary | ICD-10-CM | POA: Diagnosis not present

## 2020-08-10 DIAGNOSIS — L97523 Non-pressure chronic ulcer of other part of left foot with necrosis of muscle: Secondary | ICD-10-CM | POA: Diagnosis not present

## 2020-08-10 DIAGNOSIS — M720 Palmar fascial fibromatosis [Dupuytren]: Secondary | ICD-10-CM | POA: Diagnosis not present

## 2020-08-10 DIAGNOSIS — I10 Essential (primary) hypertension: Secondary | ICD-10-CM | POA: Diagnosis not present

## 2020-08-10 DIAGNOSIS — L97511 Non-pressure chronic ulcer of other part of right foot limited to breakdown of skin: Secondary | ICD-10-CM | POA: Diagnosis not present

## 2020-08-10 DIAGNOSIS — E1151 Type 2 diabetes mellitus with diabetic peripheral angiopathy without gangrene: Secondary | ICD-10-CM | POA: Diagnosis not present

## 2020-08-10 DIAGNOSIS — F3341 Major depressive disorder, recurrent, in partial remission: Secondary | ICD-10-CM | POA: Diagnosis not present

## 2020-08-10 DIAGNOSIS — L97423 Non-pressure chronic ulcer of left heel and midfoot with necrosis of muscle: Secondary | ICD-10-CM | POA: Diagnosis not present

## 2020-08-10 DIAGNOSIS — I2510 Atherosclerotic heart disease of native coronary artery without angina pectoris: Secondary | ICD-10-CM | POA: Diagnosis not present

## 2020-08-10 DIAGNOSIS — L409 Psoriasis, unspecified: Secondary | ICD-10-CM | POA: Diagnosis not present

## 2020-08-10 DIAGNOSIS — Z794 Long term (current) use of insulin: Secondary | ICD-10-CM | POA: Diagnosis not present

## 2020-08-10 DIAGNOSIS — K5904 Chronic idiopathic constipation: Secondary | ICD-10-CM | POA: Diagnosis not present

## 2020-08-10 DIAGNOSIS — M47812 Spondylosis without myelopathy or radiculopathy, cervical region: Secondary | ICD-10-CM | POA: Diagnosis not present

## 2020-08-16 ENCOUNTER — Ambulatory Visit (INDEPENDENT_AMBULATORY_CARE_PROVIDER_SITE_OTHER): Payer: HMO | Admitting: Endocrinology

## 2020-08-16 ENCOUNTER — Encounter: Payer: HMO | Attending: Endocrinology | Admitting: Nutrition

## 2020-08-16 ENCOUNTER — Other Ambulatory Visit: Payer: Self-pay

## 2020-08-16 VITALS — BP 100/50 | HR 60 | Ht 74.0 in | Wt 155.8 lb

## 2020-08-16 DIAGNOSIS — E1142 Type 2 diabetes mellitus with diabetic polyneuropathy: Secondary | ICD-10-CM | POA: Diagnosis not present

## 2020-08-16 DIAGNOSIS — E1165 Type 2 diabetes mellitus with hyperglycemia: Secondary | ICD-10-CM

## 2020-08-16 DIAGNOSIS — E1042 Type 1 diabetes mellitus with diabetic polyneuropathy: Secondary | ICD-10-CM

## 2020-08-16 DIAGNOSIS — E10628 Type 1 diabetes mellitus with other skin complications: Secondary | ICD-10-CM

## 2020-08-16 DIAGNOSIS — L089 Local infection of the skin and subcutaneous tissue, unspecified: Secondary | ICD-10-CM | POA: Insufficient documentation

## 2020-08-16 LAB — POCT GLYCOSYLATED HEMOGLOBIN (HGB A1C): Hemoglobin A1C: 14.9 % — AB (ref 4.0–5.6)

## 2020-08-16 MED ORDER — LANTUS SOLOSTAR 100 UNIT/ML ~~LOC~~ SOPN: 20.0000 [IU] | PEN_INJECTOR | SUBCUTANEOUS | 99 refills | Status: DC

## 2020-08-16 MED ORDER — DEXCOM G6 RECEIVER DEVI: 1.0000 | Freq: Once | 1 refills | Status: AC

## 2020-08-16 MED ORDER — DEXCOM G6 TRANSMITTER MISC: 1.0000 | Freq: Once | 1 refills | Status: DC

## 2020-08-16 MED ORDER — OMNIPOD 5 DEXG7G6 INTRO GEN 5 KIT: 1.0000 | PACK | 3 refills | Status: DC

## 2020-08-16 MED ORDER — DEXCOM G6 TRANSMITTER MISC: 1.0000 | Freq: Once | 1 refills | Status: AC

## 2020-08-16 MED ORDER — DEXCOM G6 RECEIVER DEVI: 1.0000 | Freq: Once | 1 refills | Status: DC

## 2020-08-16 MED ORDER — DEXCOM G6 SENSOR MISC: 1.0000 | 3 refills | Status: DC

## 2020-08-16 NOTE — Patient Instructions (Addendum)
check your blood sugar twice a day.  vary the time of day when you check, between before the 3 meals, and at bedtime.  also check if you have symptoms of your blood sugar being too high or too low.  please keep a record of the readings and bring it to your next appointment here (or you can bring the meter itself).  You can write it on any piece of paper.  please call us sooner if your blood sugar goes below 70, or if most of your readings are over 200. We will need to take this complex situation in stages.   I have sent a prescription to your pharmacy, for the pumps and different continuous glucose monitor.   Fpor now, please increase the Lantus to 20 units each morning Please continue the same Novolog.  Please come back for a follow-up appointment in 6 weeks.

## 2020-08-16 NOTE — Progress Notes (Signed)
Subjective:    Patient ID: [name] [name], male    DOB: 1955/02/27, 65 y.o.   MRN: [id number]  HPI Pt returns for f/u of diabetes mellitus: DM type: Insulin-requiring type 2 (due to lean body habitus: he is at risk for evolving type 1) Dx'ed: [id number] Complications: GP, CAD, PN, PAD, foot ulcer, and DR Therapy: insulin since 2013 DKA: never Severe hypoglycemia: never Pancreatitis: never Pancreatic imaging: normal on 2020 CT SDOH: Sister provides some hx, due to pt's poor health; Other: He does not use continuous glucose monitor; he stopped multiple daily injections, due to missing doses.   Interval history:  Pt says he occasionally misses the insulin shots. He broke CGM reader.  no cbg record, but states cbg varies from 280-400.  There is no trend throughout the day.  He will start pump rx soon.   Past Medical History:  Diagnosis Date   Anxiety    Arthritis    Constipation    pt states goes EOD- hard stools    Coronary artery disease    Depression    Diabetes mellitus without complication ([place])    DKA (diabetic ketoacidoses)    Hyperlipidemia    Hypertension    off meds   Psoriasis     Past Surgical History:  Procedure Laterality Date   ANKLE SURGERY     right   CARDIAC CATHETERIZATION  09/25/2001   COLONOSCOPY     ~10 yr ago- normal  per pt    CORONARY ARTERY BYPASS GRAFT  09/30/2001   CABG x 4   IR THORACENTESIS ASP PLEURAL SPACE W/IMG GUIDE  05/19/2020   LAPAROSCOPIC INGUINAL HERNIA REPAIR Bilateral 02/09/2010   MASS EXCISION Left 03/12/2013   Procedure: LEFT INDEX EXCISION MASS AND DEBRIDMENT DISTAL INTERPHALANGEAL JOINT;  Surgeon: Tennis Must, MD;  Location: [facility];  Service: Orthopedics;  Laterality: Left;    Social History   Socioeconomic History   Marital status: Divorced    Spouse name: Not on file   Number of children: 4   Years of education: 12   Highest education level: 12th grade  Occupational History   Occupation: Retired   Tobacco Use   Smoking status: Some Days    Types: Cigars   Smokeless tobacco: Never  Vaping Use   Vaping Use: Never used  Substance and Sexual Activity   Alcohol use: Yes    Comment: daily beer or scotch    Drug use: No   Sexual activity: Yes  Other Topics Concern   Not on file  Social History Narrative   Lives with his adult son [name].   Has 4 children, 2 sons- both live in [place], 2 daughters one lives in [place], [place] and youngest daughter attends [employer].    Fiance lives in  [place], [place]   Social Determinants of Health   Financial Resource Strain: Unknown   Difficulty of Paying Living Expenses: Patient refused  Food Insecurity: No Food Insecurity   Worried About Charity fundraiser in the Last Year: Never true   Ran Out of Food in the Last Year: Never true  Transportation Needs: No Transportation Needs   Lack of Transportation (Medical): No   Lack of Transportation (Non-Medical): No  Physical Activity: Inactive   Days of Exercise per Week: 0 days   Minutes of Exercise per Session: 0 min  Stress: Stress Concern Present   Feeling of Stress : Very much  Social Connections: Socially Isolated   Frequency  of Communication with Friends and Family: More than three times a week   Frequency of Social Gatherings with Friends and Family: More than three times a week   Attends Religious Services: Never   Marine scientist or Organizations: No   Attends Music therapist: Never   Marital Status: Divorced  Human resources officer Violence: Not At Risk   Fear of Current or Ex-Partner: No   Emotionally Abused: No   Physically Abused: No   Sexually Abused: No    Current Outpatient Medications on File Prior to Visit  Medication Sig Dispense Refill   aspirin EC 325 MG tablet Take 325 mg by mouth daily.     atorvastatin (LIPITOR) 40 MG tablet Take 1 tablet (40 mg total) by mouth daily. 90 tablet 1   B Complex Vitamins (B COMPLEX-B12 PO) Take 1  tablet by mouth daily.      Cholecalciferol (VITAMIN D) 125 MCG (5000 UT) CAPS Take 5,000 Units by mouth daily.      Insulin Aspart FlexPen 100 UNIT/ML SOPN Inject 8 Units into the skin 2 (two) times daily with a meal. And pen needles 3/day 15 mL 3   Insulin Pen Needle (DROPLET PEN NEEDLES) 32G X 4 MM MISC Use daily for glucose control 100 each 5   lisinopril (ZESTRIL) 5 MG tablet TAKE 1 TABLET BY MOUTH DAILY (Patient taking differently: Take 5 mg by mouth daily.) 90 tablet 1   mirtazapine (REMERON) 15 MG tablet Take 1 tablet (15 mg total) by mouth at bedtime. 90 tablet 1   PARoxetine (PAXIL) 20 MG tablet Take 1 tablet (20 mg total) by mouth daily. 90 tablet 1   SANTYL ointment Apply 1 application topically daily.     traZODone (DESYREL) 50 MG tablet Take 1 tablet (50 mg total) by mouth at bedtime. 90 tablet 1   No current facility-administered medications on file prior to visit.    No Known Allergies  Family History  Problem Relation Age of Onset   Lymphoma Father    Cancer Paternal Grandmother        Colon Cancer   Colon cancer Paternal Grandmother    Colon polyps Neg Hx    Esophageal cancer Neg Hx    Rectal cancer Neg Hx    Stomach cancer Neg Hx    Diabetes Mellitus I Neg Hx     BP (!) 100/50 (BP Location: Right Arm, Patient Position: Sitting, Cuff Size: Normal)   Pulse 60   Ht '6\' 2"'$  (1.88 m)   Wt 155 lb 12.8 oz (70.7 kg)   SpO2 97%   BMI 20.00 kg/m   Review of Systems He denies hypoglycemia    Objective:   Physical Exam VITAL SIGNS:  See vs page GENERAL: no distress Feet: both are bandaged.     Lab Results  Component Value Date   HGBA1C 14.9 (A) 08/16/2020      Assessment & Plan:  Insulin-requiring type 2 DM: uncontrolled.  Pending starting pump rx, we'll continue multiple daily injections.    Patient Instructions  check your blood sugar twice a day.  vary the time of day when you check, between before the 3 meals, and at bedtime.  also check if you have  symptoms of your blood sugar being too high or too low.  please keep a record of the readings and bring it to your next appointment here (or you can bring the meter itself).  You can write it on any piece of paper.  please call us sooner if your blood sugar goes below 70, or if most of your readings are over 200. We will need to take this complex situation in stages.   I have sent a prescription to your pharmacy, for the pumps and different continuous glucose monitor.   Fpor now, please increase the Lantus to 20 units each morning Please continue the same Novolog.  Please come back for a follow-up appointment in 6 weeks.

## 2020-08-17 ENCOUNTER — Other Ambulatory Visit: Payer: Self-pay | Admitting: *Deleted

## 2020-08-17 ENCOUNTER — Telehealth: Payer: Self-pay | Admitting: Pharmacy Technician

## 2020-08-17 DIAGNOSIS — I10 Essential (primary) hypertension: Secondary | ICD-10-CM | POA: Diagnosis not present

## 2020-08-17 DIAGNOSIS — M47812 Spondylosis without myelopathy or radiculopathy, cervical region: Secondary | ICD-10-CM | POA: Diagnosis not present

## 2020-08-17 DIAGNOSIS — M1990 Unspecified osteoarthritis, unspecified site: Secondary | ICD-10-CM | POA: Diagnosis not present

## 2020-08-17 DIAGNOSIS — L97423 Non-pressure chronic ulcer of left heel and midfoot with necrosis of muscle: Secondary | ICD-10-CM | POA: Diagnosis not present

## 2020-08-17 DIAGNOSIS — E1151 Type 2 diabetes mellitus with diabetic peripheral angiopathy without gangrene: Secondary | ICD-10-CM | POA: Diagnosis not present

## 2020-08-17 DIAGNOSIS — F3341 Major depressive disorder, recurrent, in partial remission: Secondary | ICD-10-CM | POA: Diagnosis not present

## 2020-08-17 DIAGNOSIS — I480 Paroxysmal atrial fibrillation: Secondary | ICD-10-CM | POA: Diagnosis not present

## 2020-08-17 DIAGNOSIS — E785 Hyperlipidemia, unspecified: Secondary | ICD-10-CM | POA: Diagnosis not present

## 2020-08-17 DIAGNOSIS — Z7982 Long term (current) use of aspirin: Secondary | ICD-10-CM | POA: Diagnosis not present

## 2020-08-17 DIAGNOSIS — M720 Palmar fascial fibromatosis [Dupuytren]: Secondary | ICD-10-CM | POA: Diagnosis not present

## 2020-08-17 DIAGNOSIS — L409 Psoriasis, unspecified: Secondary | ICD-10-CM | POA: Diagnosis not present

## 2020-08-17 DIAGNOSIS — Z7901 Long term (current) use of anticoagulants: Secondary | ICD-10-CM | POA: Diagnosis not present

## 2020-08-17 DIAGNOSIS — K5904 Chronic idiopathic constipation: Secondary | ICD-10-CM | POA: Diagnosis not present

## 2020-08-17 DIAGNOSIS — Z794 Long term (current) use of insulin: Secondary | ICD-10-CM | POA: Diagnosis not present

## 2020-08-17 DIAGNOSIS — L97511 Non-pressure chronic ulcer of other part of right foot limited to breakdown of skin: Secondary | ICD-10-CM | POA: Diagnosis not present

## 2020-08-17 DIAGNOSIS — N401 Enlarged prostate with lower urinary tract symptoms: Secondary | ICD-10-CM | POA: Diagnosis not present

## 2020-08-17 DIAGNOSIS — I2510 Atherosclerotic heart disease of native coronary artery without angina pectoris: Secondary | ICD-10-CM | POA: Diagnosis not present

## 2020-08-17 DIAGNOSIS — S42132D Displaced fracture of coracoid process, left shoulder, subsequent encounter for fracture with routine healing: Secondary | ICD-10-CM | POA: Diagnosis not present

## 2020-08-17 DIAGNOSIS — E11621 Type 2 diabetes mellitus with foot ulcer: Secondary | ICD-10-CM | POA: Diagnosis not present

## 2020-08-17 DIAGNOSIS — N138 Other obstructive and reflux uropathy: Secondary | ICD-10-CM | POA: Diagnosis not present

## 2020-08-17 DIAGNOSIS — G8929 Other chronic pain: Secondary | ICD-10-CM | POA: Diagnosis not present

## 2020-08-17 DIAGNOSIS — D649 Anemia, unspecified: Secondary | ICD-10-CM | POA: Diagnosis not present

## 2020-08-17 DIAGNOSIS — F419 Anxiety disorder, unspecified: Secondary | ICD-10-CM | POA: Diagnosis not present

## 2020-08-17 DIAGNOSIS — L97523 Non-pressure chronic ulcer of other part of left foot with necrosis of muscle: Secondary | ICD-10-CM | POA: Diagnosis not present

## 2020-08-17 DIAGNOSIS — I739 Peripheral vascular disease, unspecified: Secondary | ICD-10-CM

## 2020-08-17 DIAGNOSIS — E1142 Type 2 diabetes mellitus with diabetic polyneuropathy: Secondary | ICD-10-CM | POA: Diagnosis not present

## 2020-08-17 NOTE — Patient Instructions (Addendum)
Go to web site and learn about the OmniPOd insulin pump works Call if questions Call when insulin pump and CGM come in.

## 2020-08-17 NOTE — Telephone Encounter (Addendum)
Patient Advocate Encounter   Received notification from [facility] that prior authorization for [place] is required.   PA submitted on 08/17/2020 [place] [id number] Status is DENIED under PART D  AVAILABLE WITHOUT PA UNDER PART [facility] will continue to follow   Ronney Asters, CPhT Patient Advocate [facility] Phone: [phone] Fax:  [phone]

## 2020-08-17 NOTE — Progress Notes (Signed)
Patient is here with his wife to learn about insulin pumps.  We discussed how insulin pumps work and he was shown the 3 models.  He chose the OmniPod 5 system.  He reported that he wants to start this as soon as possible.   Also discussed CGM and how this works with his pump, and the need to start this before beginning the pump.  He does not adjust his meal time insulin dose for meal size and high blood sugars. He does not know how to carb count, and we discussed what carbs are, and he was shown 15 gram portions sizes of what carbs he typically eats.  We also discussed how to read a label,  Handouts were given on 15 gram carb portions, how to read a label, and fast food menues with carb portions.  Stressed the need to learn this, as it will make for a more accurate bolus dose amount.

## 2020-08-18 ENCOUNTER — Other Ambulatory Visit: Payer: Self-pay

## 2020-08-18 ENCOUNTER — Encounter (HOSPITAL_BASED_OUTPATIENT_CLINIC_OR_DEPARTMENT_OTHER): Payer: HMO | Admitting: Internal Medicine

## 2020-08-18 DIAGNOSIS — L97512 Non-pressure chronic ulcer of other part of right foot with fat layer exposed: Secondary | ICD-10-CM | POA: Diagnosis not present

## 2020-08-18 DIAGNOSIS — E11621 Type 2 diabetes mellitus with foot ulcer: Secondary | ICD-10-CM | POA: Diagnosis not present

## 2020-08-18 DIAGNOSIS — L89622 Pressure ulcer of left heel, stage 2: Secondary | ICD-10-CM | POA: Diagnosis not present

## 2020-08-18 DIAGNOSIS — L97422 Non-pressure chronic ulcer of left heel and midfoot with fat layer exposed: Secondary | ICD-10-CM | POA: Diagnosis not present

## 2020-08-18 NOTE — Progress Notes (Signed)
PURNELL, DAIGLE ([id number]) Visit Report for 08/18/2020 Arrival Information Details Patient Name: Date of Service: [name] [name] [facility] THA N L. 08/18/2020 8:00 A M Medical Record Number: [id number] Patient Account Number: [id number] Date of Birth/Sex: Treating RN: 12/11/1955 (65 y.o. [name] Primary Care Oneil Behney: [name] Other Clinician: Referring Cove Haydon: Treating Pa Tennant/Extender: [name] in Treatment: 6 Visit Information History Since Last Visit Added or deleted any medications: No Patient Arrived: Ambulatory Any new allergies or adverse reactions: No Arrival Time: 07:53 Had a fall or experienced change in No Accompanied By: family member activities of daily living that may affect Transfer Assistance: None risk of falls: Patient Identification Verified: Yes Signs or symptoms of abuse/neglect since last visito No Secondary Verification Process Completed: Yes Hospitalized since last visit: No Patient Requires Transmission-Based Precautions: No Implantable device outside of the clinic excluding No Patient Has Alerts: Yes cellular tissue based products placed in the center Patient Alerts: Patient on Blood Thinner since last visit: Has Dressing in Place as Prescribed: Yes Pain Present Now: No Electronic Signature(s) Signed: 08/18/2020 6:20:17 PM By: Levan Hurst RN, BSN Entered By: Levan Hurst on 08/18/2020 07:54:13 -------------------------------------------------------------------------------- Encounter Discharge Information Details Patient Name: Date of Service: [name] [name] [facility] N L. 08/18/2020 8:00 A M Medical Record Number: [id number] Patient Account Number: [id number] Date of Birth/Sex: Treating RN: October 12, 1955 (65 y.o. [name] Primary Care Jesselle Laflamme: [name] Other Clinician: Referring Nikiah Goin: Treating Nariyah Osias/Extender: [name] in Treatment: 6 Encounter Discharge Information  Items Post Procedure Vitals Discharge Condition: Stable Temperature (F): 98.3 Ambulatory Status: Wheelchair Pulse (bpm): 69 Discharge Destination: Home Respiratory Rate (breaths/min): 16 Transportation: Private Auto Blood Pressure (mmHg): 151/78 Accompanied By: family member Schedule Follow-up Appointment: Yes Clinical Summary of Care: Patient Declined Electronic Signature(s) Signed: 08/18/2020 6:20:17 PM By: Levan Hurst RN, BSN Entered By: Levan Hurst on 08/18/2020 08:40:53 -------------------------------------------------------------------------------- Lower Extremity Assessment Details Patient Name: Date of Service: [name] [name] [facility] L. 08/18/2020 8:00 A M Medical Record Number: [id number] Patient Account Number: [id number] Date of Birth/Sex: Treating RN: Apr 10, 1955 (65 y.o. [name] Primary Care Teion Ballin: [name] Other Clinician: Referring Jakai Onofre: Treating Jung Ingerson/Extender: [name] in Treatment: 6 Edema Assessment Assessed: [name]: No] [name]: No] Edema: [Left: No] [Right: No] Calf Left: Right: Point of Measurement: 34 cm From Medial Instep 33 cm 31.5 cm Ankle Left: Right: Point of Measurement: 10 cm From Medial Instep 23 cm 22 cm Vascular Assessment Pulses: Dorsalis Pedis Palpable: [Left:Yes] [Right:Yes] Electronic Signature(s) Signed: 08/18/2020 6:20:17 PM By: Levan Hurst RN, BSN Entered By: Levan Hurst on 08/18/2020 08:02:25 -------------------------------------------------------------------------------- Multi Wound Chart Details Patient Name: Date of Service: [name] [name] [facility] L. 08/18/2020 8:00 A M Medical Record Number: [id number] Patient Account Number: [id number] Date of Birth/Sex: Treating RN: 08-10-1955 (65 y.o. [name] Primary Care Jamye Balicki: [name] Other Clinician: Referring Ericson Nafziger: Treating Sharlett Lienemann/Extender: [name] in Treatment: 6 Vital  Signs Height(in): 25 Capillary Blood Glucose(mg/dl): 316 Weight(lbs): 151 Pulse(bpm): 73 Body Mass Index(BMI): 25 Blood Pressure(mmHg): 151/78 Temperature(F): 98.3 Respiratory Rate(breaths/min): 16 Photos: [1:No Photos Left Calcaneus] [2:No Photos Left, Lateral Foot] [3:No Photos Right, Lateral Foot] Wound Location: [1:Pressure Injury] [2:Gradually Appeared] [3:Gradually Appeared] Wounding Event: [1:Pressure Ulcer] [2:Diabetic Wound/Ulcer of the Lower] [3:Diabetic Wound/Ulcer of the Lower] Primary Etiology: [1:Coronary Artery Disease,] [2:Extremity Coronary Artery Disease,] [3:Extremity Coronary Artery Disease,] Comorbid History: [1:Hypertension, Type II Diabetes, Osteoarthritis, Neuropathy 05/23/2020] [2:Hypertension, Type II Diabetes,  Osteoarthritis, Neuropathy 06/21/2020] [3:Hypertension, Type II Diabetes, Osteoarthritis, Neuropathy 06/21/2020] Date Acquired: [1:6] [2:6] [3:6] Weeks of Treatment: [1:Open] [2:Open] [3:Open] Wound Status: [1:3.7x2.3x0.1] [2:1.1x1x0.1] [3:1.1x1.2x0.1] Measurements L x W x D (cm) [1:6.684] [2:0.864] [3:1.037] A (cm) : rea [1:0.668] [2:0.086] [3:0.104] Volume (cm) : [1:46.80%] [2:-22.20%] [3:48.20%] % Reduction in A rea: [1:73.40%] [2:-21.10%] [3:74.10%] % Reduction in Volume: [1:Category/Stage III] [2:Grade 1] [3:Grade 2] Classification: [1:Medium] [2:Medium] [3:Medium] Exudate A mount: [1:Serosanguineous] [2:Serosanguineous] [3:Serosanguineous] Exudate Type: [1:red, brown] [2:red, brown] [3:red, brown] Exudate Color: [1:Flat and Intact] [2:Distinct, outline attached] [3:Distinct, outline attached] Wound Margin: [1:Small (1-33%)] [2:Medium (34-66%)] [3:Small (1-33%)] Granulation A mount: [1:Pink] [2:Red] [3:Red, Pink] Granulation Quality: [1:Large (67-100%)] [2:Medium (34-66%)] [3:Large (67-100%)] Necrotic A mount: [1:Fat Layer (Subcutaneous Tissue): Yes Fat Layer (Subcutaneous Tissue): Yes Fat Layer (Subcutaneous Tissue): Yes] Exposed  Structures: [1:Fascia: No Tendon: No Muscle: No Joint: No Bone: No Small (1-33%)] [2:Fascia: No Tendon: No Muscle: No Joint: No Bone: No None] [3:Fascia: No Tendon: No Muscle: No Joint: No Bone: No Small (1-33%)] Epithelialization: [1:Debridement - Excisional] [2:N/A] [3:Debridement - Excisional] Debridement: Pre-procedure Verification/Time Out 08:15 [2:N/A] [3:08:15] Taken: [1:Subcutaneous, Slough] [2:N/A] [3:Subcutaneous, Slough] Tissue Debrided: [1:Skin/Subcutaneous Tissue] [2:N/A] [3:Skin/Subcutaneous Tissue] Level: [1:8.51] [2:N/A] [3:1.32] Debridement A (sq cm): [1:rea Curette] [2:N/A] [3:Curette] Instrument: [1:Minimum] [2:N/A] [3:Minimum] Bleeding: [1:Pressure] [2:N/A] [3:Pressure] Hemostasis A chieved: [1:0] [2:N/A] [3:0] Procedural Pain: [1:0] [2:N/A] [3:0] Post Procedural Pain: [1:Procedure was tolerated well] [2:N/A] [3:Procedure was tolerated well] Debridement Treatment Response: [1:3.7x2.3x0.1] [2:N/A] [3:1.1x1.2x0.1] Post Debridement Measurements L x W x D (cm) [1:0.668] [2:N/A] [3:0.104] Post Debridement Volume: (cm) [1:Category/Stage III] [2:N/A] [3:N/A] Post Debridement Stage: [1:Debridement] [2:N/A] [3:Debridement] Treatment Notes Electronic Signature(s) Signed: 08/18/2020 4:52:40 PM By: Linton Ham MD Signed: 08/18/2020 6:11:30 PM By: [name] Entered By: Linton Ham on 08/18/2020 08:34:54 -------------------------------------------------------------------------------- Multi-Disciplinary Care Plan Details Patient Name: Date of Service: [name] [name] [facility]. 08/18/2020 8:00 A M Medical Record Number: [id number] Patient Account Number: [id number] Date of Birth/Sex: Treating RN: 1955/04/24 (65 y.o. [name] Primary Care Denzil Mceachron: [name] Other Clinician: Referring Deborh Pense: Treating Ziyad Dyar/Extender: [name] in Treatment: 6 Active Inactive Pain, Acute or Chronic Nursing Diagnoses: Pain, acute or chronic:  actual or potential Potential alteration in comfort, pain Goals: Patient will verbalize adequate pain control and receive pain control interventions during procedures as needed Date Initiated: 07/07/2020 Target Resolution Date: 08/25/2020 Goal Status: Active Patient/caregiver will verbalize comfort level met Date Initiated: 07/07/2020 Target Resolution Date: 08/18/2020 Goal Status: Active Interventions: Encourage patient to take pain medications as prescribed Provide education on pain management Reposition patient for comfort Treatment Activities: Administer pain control measures as ordered : 07/07/2020 Notes: Wound/Skin Impairment Nursing Diagnoses: Knowledge deficit related to ulceration/compromised skin integrity Goals: Patient/caregiver will verbalize understanding of skin care regimen Date Initiated: 07/07/2020 Target Resolution Date: 08/18/2020 Goal Status: Active Interventions: Assess patient/caregiver ability to obtain necessary supplies Assess patient/caregiver ability to perform ulcer/skin care regimen upon admission and as needed Provide education on ulcer and skin care Treatment Activities: Skin care regimen initiated : 07/07/2020 Topical wound management initiated : 07/07/2020 Notes: Electronic Signature(s) Signed: 08/18/2020 6:20:17 PM By: Levan Hurst RN, BSN Entered By: Levan Hurst on 08/18/2020 08:04:47 -------------------------------------------------------------------------------- Pain Assessment Details Patient Name: Date of Service: [name] [name] [facility] L. 08/18/2020 8:00 A M Medical Record Number: [id number] Patient Account Number: [id number] Date of Birth/Sex: Treating RN: 03-Aug-1955 (65 y.o. [name] Primary Care Aloni Chuang: [name] Other Clinician: Referring Jiyah Torpey: Treating Ayham Word/Extender: Westly Pam  Weeks in Treatment: 6 Active Problems Location of Pain Severity and Description of Pain Patient Has Paino No Site  Locations Pain Management and Medication Current Pain Management: Electronic Signature(s) Signed: 08/18/2020 6:20:17 PM By: Levan Hurst RN, BSN Entered By: Levan Hurst on 08/18/2020 07:54:37 -------------------------------------------------------------------------------- Patient/Caregiver Education Details Patient Name: Date of Service: [name] [name] [facility] TH[facility] N L. 8/18/2022andnbsp8:00 A M Medical Record Number: [id number] Patient Account Number: [id number] Date of Birth/Gender: Treating RN: 1955/02/19 (65 y.o. [name] Primary Care Physician: [name] Other Clinician: Referring Physician: Treating Physician/Extender: [name] in Treatment: 6 Education Assessment Education Provided To: Patient Education Topics Provided Wound/Skin Impairment: Methods: Explain/Verbal Responses: State content correctly Electronic Signature(s) Signed: 08/18/2020 6:20:17 PM By: Levan Hurst RN, BSN Entered By: Levan Hurst on 08/18/2020 08:05:00 -------------------------------------------------------------------------------- Wound Assessment Details Patient Name: Date of Service: [name] [name] [facility] L. 08/18/2020 8:00 A M Medical Record Number: [id number] Patient Account Number: [id number] Date of Birth/Sex: Treating RN: 1955/05/07 (65 y.o. [name] Primary Care Zayde Stroupe: [name] Other Clinician: Referring Reid Nawrot: Treating Jahari Billy/Extender: [name] in Treatment: 6 Wound Status Wound Number: 1 Primary Pressure Ulcer Etiology: Wound Location: Left Calcaneus Wound Open Wounding Event: Pressure Injury Status: Date Acquired: 05/23/2020 Comorbid Coronary Artery Disease, Hypertension, Type II Diabetes, Weeks Of Treatment: 6 History: Osteoarthritis, Neuropathy Clustered Wound: No Wound Measurements Length: (cm) 3.7 Width: (cm) 2.3 Depth: (cm) 0.1 Area: (cm) 6.684 Volume: (cm) 0.668 % Reduction in  Area: 46.8% % Reduction in Volume: 73.4% Epithelialization: Small (1-33%) Tunneling: No Undermining: No Wound Description Classification: Category/Stage III Wound Margin: Flat and Intact Exudate Amount: Medium Exudate Type: Serosanguineous Exudate Color: red, brown Foul Odor After Cleansing: No Slough/Fibrino Yes Wound Bed Granulation Amount: Small (1-33%) Exposed Structure Granulation Quality: Pink Fascia Exposed: No Necrotic Amount: Large (67-100%) Fat Layer (Subcutaneous Tissue) Exposed: Yes Necrotic Quality: Adherent Slough Tendon Exposed: No Muscle Exposed: No Joint Exposed: No Bone Exposed: No Treatment Notes Wound #1 (Calcaneus) Wound Laterality: Left Cleanser Soap and Water Discharge Instruction: May shower and wash wound with dial antibacterial soap and water prior to dressing change. Wound Cleanser Discharge Instruction: Cleanse the wound with wound cleanser prior to applying a clean dressing using gauze sponges, not tissue or cotton balls. Peri-Wound Care Topical Primary Dressing Hydrofera Blue Classic Foam, 4x4 in Discharge Instruction: Moisten with saline prior to applying to wound bed Santyl Ointment Discharge Instruction: Apply nickel thick amount to wound bed as instructed Secondary Dressing Woven Gauze Sponge, Non-Sterile 4x4 in Discharge Instruction: Apply over primary dressing as directed. ALLEVYN Heel 4 1/2in x 5 1/2in / 10.5cm x 13.5cm Discharge Instruction: Apply over primary dressing as directed. Secured With [employer], 4.5x3.1 (in/yd) Discharge Instruction: Secure with Kerlix as directed. 39M Medipore H Soft Cloth Surgical T 4 x 2 (in/yd) ape Discharge Instruction: Secure dressing with tape as directed. Compression Wrap Compression Stockings Add-Ons Electronic Signature(s) Signed: 08/18/2020 6:20:17 PM By: Levan Hurst RN, BSN Signed: 08/18/2020 6:20:17 PM By: Levan Hurst RN, BSN Entered By: Levan Hurst on 08/18/2020  08:03:08 -------------------------------------------------------------------------------- Wound Assessment Details Patient Name: Date of Service: [name] [name] [facility] N L. 08/18/2020 8:00 A M Medical Record Number: [id number] Patient Account Number: [id number] Date of Birth/Sex: Treating RN: May 26, 1955 (65 y.o. [name] Primary Care Ernie Kasler: [name] Other Clinician: Referring Dakota Vanwart: Treating Jeanelle Dake/Extender: [name] in Treatment: 6 Wound Status Wound Number: 2 Primary Diabetic Wound/Ulcer of the Lower Extremity Etiology:  Wound Location: Left, Lateral Foot Wound Open Wounding Event: Gradually Appeared Status: Date Acquired: 06/21/2020 Comorbid Coronary Artery Disease, Hypertension, Type II Diabetes, Weeks Of Treatment: 6 History: Osteoarthritis, Neuropathy Clustered Wound: No Wound Measurements Length: (cm) 1.1 Width: (cm) 1 Depth: (cm) 0.1 Area: (cm) 0.864 Volume: (cm) 0.086 % Reduction in Area: -22.2% % Reduction in Volume: -21.1% Epithelialization: None Tunneling: No Undermining: No Wound Description Classification: Grade 1 Wound Margin: Distinct, outline attached Exudate Amount: Medium Exudate Type: Serosanguineous Exudate Color: red, brown Foul Odor After Cleansing: No Slough/Fibrino Yes Wound Bed Granulation Amount: Medium (34-66%) Exposed Structure Granulation Quality: Red Fascia Exposed: No Necrotic Amount: Medium (34-66%) Fat Layer (Subcutaneous Tissue) Exposed: Yes Necrotic Quality: Adherent Slough Tendon Exposed: No Muscle Exposed: No Joint Exposed: No Bone Exposed: No Treatment Notes Wound #2 (Foot) Wound Laterality: Left, Lateral Cleanser Soap and Water Discharge Instruction: May shower and wash wound with dial antibacterial soap and water prior to dressing change. Wound Cleanser Discharge Instruction: Cleanse the wound with wound cleanser prior to applying a clean dressing using gauze sponges,  not tissue or cotton balls. Peri-Wound Care Topical Primary Dressing Hydrofera Blue Classic Foam, 4x4 in Discharge Instruction: Moisten with saline prior to applying to wound bed Santyl Ointment Discharge Instruction: Apply nickel thick amount to wound bed as instructed Secondary Dressing Woven Gauze Sponge, Non-Sterile 4x4 in Discharge Instruction: Apply over primary dressing as directed. Secured With [employer], 4.5x3.1 (in/yd) Discharge Instruction: Secure with Kerlix as directed. 45M Medipore H Soft Cloth Surgical T 4 x 2 (in/yd) ape Discharge Instruction: Secure dressing with tape as directed. Compression Wrap Compression Stockings Add-Ons Electronic Signature(s) Signed: 08/18/2020 6:20:17 PM By: Levan Hurst RN, BSN Entered By: Levan Hurst on 08/18/2020 08:03:35 -------------------------------------------------------------------------------- Wound Assessment Details Patient Name: Date of Service: [name] [name] [facility] N L. 08/18/2020 8:00 A M Medical Record Number: [id number] Patient Account Number: [id number] Date of Birth/Sex: Treating RN: [birth year] (65 y.o. [name] Primary Care Miquel Lamson: [name] Other Clinician: Referring Susan Bleich: Treating Keriana Sarsfield/Extender: [name] in Treatment: 6 Wound Status Wound Number: 3 Primary Diabetic Wound/Ulcer of the Lower Extremity Etiology: Wound Location: Right, Lateral Foot Wound Open Wounding Event: Gradually Appeared Status: Date Acquired: 06/21/2020 Comorbid Coronary Artery Disease, Hypertension, Type II Diabetes, Weeks Of Treatment: 6 History: Osteoarthritis, Neuropathy Clustered Wound: No Wound Measurements Length: (cm) 1.1 Width: (cm) 1.2 Depth: (cm) 0.1 Area: (cm) 1.037 Volume: (cm) 0.104 % Reduction in Area: 48.2% % Reduction in Volume: 74.1% Epithelialization: Small (1-33%) Tunneling: No Undermining: No Wound Description Classification: Grade 2 Wound  Margin: Distinct, outline attached Exudate Amount: Medium Exudate Type: Serosanguineous Exudate Color: red, brown Foul Odor After Cleansing: No Slough/Fibrino Yes Wound Bed Granulation Amount: Small (1-33%) Exposed Structure Granulation Quality: Red, Pink Fascia Exposed: No Necrotic Amount: Large (67-100%) Fat Layer (Subcutaneous Tissue) Exposed: Yes Necrotic Quality: Adherent Slough Tendon Exposed: No Muscle Exposed: No Joint Exposed: No Bone Exposed: No Treatment Notes Wound #3 (Foot) Wound Laterality: Right, Lateral Cleanser Soap and Water Discharge Instruction: May shower and wash wound with dial antibacterial soap and water prior to dressing change. Wound Cleanser Discharge Instruction: Cleanse the wound with wound cleanser prior to applying a clean dressing using gauze sponges, not tissue or cotton balls. Peri-Wound Care Topical Primary Dressing Hydrofera Blue Classic Foam, 4x4 in Discharge Instruction: Moisten with saline prior to applying to wound bed Santyl Ointment Discharge Instruction: Apply nickel thick amount to wound bed as instructed Secondary Dressing Woven Gauze Sponge, Non-Sterile 4x4 in  Discharge Instruction: Apply over primary dressing as directed. Secured With [employer], 4.5x3.1 (in/yd) Discharge Instruction: Secure with Kerlix as directed. 39M Medipore H Soft Cloth Surgical T 4 x 2 (in/yd) ape Discharge Instruction: Secure dressing with tape as directed. Compression Wrap Compression Stockings Add-Ons Electronic Signature(s) Signed: 08/18/2020 6:20:17 PM By: Levan Hurst RN, BSN Entered By: Levan Hurst on 08/18/2020 08:04:08 -------------------------------------------------------------------------------- [place] Details Patient Name: Date of Service: [name] [name] [facility] TH[facility] N L. 08/18/2020 8:00 A M Medical Record Number: [id number] Patient Account Number: [id number] Date of Birth/Sex: Treating RN: 1955/07/25 (65 y.o. [name] Primary Care Laurene Melendrez: [name] Other Clinician: Referring Kenidee Cregan: Treating Shilpa Bushee/Extender: [name] in Treatment: 6 Vital Signs Time Taken: 07:54 Temperature (F): 98.3 Height (in): 74 Pulse (bpm): 69 Weight (lbs): 151 Respiratory Rate (breaths/min): 16 Body Mass Index (BMI): 19.4 Blood Pressure (mmHg): 151/78 Capillary Blood Glucose (mg/dl): 316 Reference Range: 80 - 120 mg / dl Notes glucose per pt report Electronic Signature(s) Signed: 08/18/2020 6:20:17 PM By: Levan Hurst RN, BSN Entered By: Levan Hurst on 08/18/2020 07:54:32

## 2020-08-18 NOTE — Progress Notes (Signed)
BRITON, TIFFIN ([id number]) Visit Report for 08/18/2020 Debridement Details Patient Name: Date of Service: [name] [facility] N L. 08/18/2020 8:00 A M Medical Record Number: [id number] Patient Account Number: [id number] Date of Birth/Sex: Treating RN: 1955/04/07 (65 y.o. [name] Primary Care Provider: [name] Other Clinician: Referring Provider: Treating Provider/Extender: [name] in Treatment: 6 Debridement Performed for Assessment: Wound #1 Left Calcaneus Performed By: Physician Ricard Dillon., MD Debridement Type: Debridement Level of Consciousness (Pre-procedure): Awake and Alert Pre-procedure Verification/Time Out Yes - 08:15 Taken: Start Time: 08:15 T Area Debrided (L x W): otal 3.7 (cm) x 2.3 (cm) = 8.51 (cm) Tissue and other material debrided: Viable, Non-Viable, Slough, Subcutaneous, Slough Level: Skin/Subcutaneous Tissue Debridement Description: Excisional Instrument: Curette Bleeding: Minimum Hemostasis Achieved: Pressure End Time: 08:16 Procedural Pain: 0 Post Procedural Pain: 0 Response to Treatment: Procedure was tolerated well Level of Consciousness (Post- Awake and Alert procedure): Post Debridement Measurements of Total Wound Length: (cm) 3.7 Stage: Category/Stage III Width: (cm) 2.3 Depth: (cm) 0.1 Volume: (cm) 0.668 Character of Wound/Ulcer Post Debridement: Requires Further Debridement Post Procedure Diagnosis Same as Pre-procedure Electronic Signature(s) Signed: 08/18/2020 4:52:40 PM By: Linton Ham MD Signed: 08/18/2020 6:11:30 PM By: [name] Entered By: Linton Ham on 08/18/2020 08:35:02 -------------------------------------------------------------------------------- Debridement Details Patient Name: Date of Service: [name] [facility] N L. 08/18/2020 8:00 A M Medical Record Number: [id number] Patient Account Number: [id number] Date of Birth/Sex: Treating RN: 1955-11-29 (65 y.o. [name], [name] Primary Care Provider: [name] Other Clinician: Referring Provider: Treating Provider/Extender: [name] in Treatment: 6 Debridement Performed for Assessment: Wound #3 Right,Lateral Foot Performed By: Physician Ricard Dillon., MD Debridement Type: Debridement Severity of Tissue Pre Debridement: Fat layer exposed Level of Consciousness (Pre-procedure): Awake and Alert Pre-procedure Verification/Time Out Yes - 08:15 Taken: Start Time: 08:15 T Area Debrided (L x W): otal 1.1 (cm) x 1.2 (cm) = 1.32 (cm) Tissue and other material debrided: Viable, Non-Viable, Slough, Subcutaneous, Slough Level: Skin/Subcutaneous Tissue Debridement Description: Excisional Instrument: Curette Bleeding: Minimum Hemostasis Achieved: Pressure End Time: 08:16 Procedural Pain: 0 Post Procedural Pain: 0 Response to Treatment: Procedure was tolerated well Level of Consciousness (Post- Awake and Alert procedure): Post Debridement Measurements of Total Wound Length: (cm) 1.1 Width: (cm) 1.2 Depth: (cm) 0.1 Volume: (cm) 0.104 Character of Wound/Ulcer Post Debridement: Requires Further Debridement Severity of Tissue Post Debridement: Fat layer exposed Post Procedure Diagnosis Same as Pre-procedure Electronic Signature(s) Signed: 08/18/2020 4:52:40 PM By: Linton Ham MD Signed: 08/18/2020 6:11:30 PM By: [name] Entered By: Linton Ham on 08/18/2020 08:35:11 -------------------------------------------------------------------------------- HPI Details Patient Name: Date of Service: [name] [facility]. 08/18/2020 8:00 A M Medical Record Number: [id number] Patient Account Number: [id number] Date of Birth/Sex: Treating RN: 1955-01-17 (65 y.o. [name] Primary Care Provider: [name] Other Clinician: Referring Provider: Treating Provider/Extender: [name] in Treatment: 6 History of Present  Illness HPI Description: ADMISSION 07/07/2020 This is a 65 year old man who is a type II diabetic. I have reviewed this in epic. It appeared that his diabetes was diagnosed in 2009 at which time he was on metformin. He was followed for a period of time by an endocrinologist with [facility] to always labeled him as a type II diabetic. Recently in epic there is been a switch to type 1 diabetes. If there were defining antibody test done I do not see them and I think the totality of  the evidence would suggest that he is a type II diabetic. His recent problem started in May when he fell down 17 stairs. He suffered a scapular fracture he was hospitalized at [facility] for 9 days and then discharged to [facility]. His mother who is present said that most of these wounds seem to develop surrounding the discharge from hospital or the stay at [facility]. He has an area on his lower sacrum which appears currently to be a stage II but per their description this is actually improved quite a bit. He has an area on the left lateral foot at roughly the base of the fifth metatarsal, the left Achilles heel and the right fifth metatarsal head laterally they have been using topical antibiotics. Seen by primary care yesterday and placed on Keflex The patient had arterial studies on 05/05/2020. I wondered whether this had something to do with wounds on his feet at that time but I have not been able to determine this. At that time his ABI on the right was 1.09 with a TBI of 0.65 on the left 0.88 with a TBI of 0.44. Waveforms were triphasic on the right monophasic on the left. He likely has significant PAD on the left Past medical history includes type 2 diabetes poorly controlled last hemoglobin A1c at 12.7 in May, hypertension, hyperlipidemia, atrial fib on Eliquis, coronary artery disease status post CABG, left scapular fracture in May of this year, AAA repair. Bilateral leg weakness ABIs were not done in our clinic as they were  just done in May of this [facility]; patient arrives back in clinic he has areas on the left Achilles heel, base of the left fifth metatarsal, right lateral fifth metatarsal. The area he had over the coccyx appears to be healed but this area remains vulnerable. We have been using Iodoflex. I rereviewed his ABIs which were done in May. I do not know exactly what prompted these test however they do show a TBI of 0.44 on the left and monophasic waveforms. 7/21; the patient's wounds are about the same. Certainly no healing and the nurses noted some green drainage on the right fifth metatarsal head, left Achilles a little larger. We have been using silver alginate really without any improvement. We did get an appointment with vascular surgery however its that it did the end of August and they seem to want to repeat the noninvasive test that they have already done in May. I am not sure what the issue here is. We advised the patient's sister to call and point to the idea that they have already had noninvasive studies. The real question I have is does this patient need an angiogram. He is a diabetic and it is possible that the ABIs are falsely elevated because of medial calcification. At least on the left his TBI was abnormal 08/08/2020 upon evaluation today patient appears to be doing about the same in regard to his wounds. He still waiting the appointment with vascular. This is something that hopefully should be undertaken shortly. The 29th of this month is when he is actually scheduled although they have put in a call to move this up if at all possible. Fortunately there does not appear to be any signs of active infection at this time. No fevers, chills, nausea, vomiting, or diarrhea. 8/18; appointment with Dr. Trula Slade on 8/29. The patient has wounds on the left Achilles heel, left lateral foot and the right lateral fifth metatarsal head. We have  been using Santyl under Hydrofera Blue.  The surface of especially the left heel looks better to me than what I remember Electronic Signature(s) Signed: 08/18/2020 4:52:40 PM By: Linton Ham MD Entered By: Linton Ham on 08/18/2020 08:36:14 -------------------------------------------------------------------------------- Physical Exam Details Patient Name: Date of Service: [name] [facility] N L. 08/18/2020 8:00 A M Medical Record Number: [id number] Patient Account Number: [id number] Date of Birth/Sex: Treating RN: 08-05-1955 (65 y.o. [name] Primary Care Provider: [name] Other Clinician: Referring Provider: Treating Provider/Extender: [name] in Treatment: 6 Constitutional Patient is hypertensive.. Pulse regular and within target range for patient.Marland Kitchen Respirations regular, non-labored and within target range.. Temperature is normal and within the target range for the patient.Marland Kitchen Appears in no distress. Notes Wound exam; the left Achilles heel has 100% covered with tightly adherent necrotic material although there is certainly more visible granulation than what I remember. I used a #5 curette to remove this this was a subcutaneous debridement hemostasis with a pressure dressing The left lateral foot does not require debridement it seems smaller and more epithelialized On the right lateral fifth metatarsal head again 100% covered in a very tight adherent surface. I used a #5 curette to remove this hemostasis with silver nitrate and pressure Electronic Signature(s) Signed: 08/18/2020 4:52:40 PM By: Linton Ham MD Entered By: Linton Ham on 08/18/2020 08:38:59 -------------------------------------------------------------------------------- Physician Orders Details Patient Name: Date of Service: [name] [facility] N L. 08/18/2020 8:00 A M Medical Record Number: [id number] Patient Account Number: [id number] Date of Birth/Sex: Treating RN: [birth year] (65 y.o. [name] Primary Care Provider: [name] Other Clinician: Referring Provider: Treating Provider/Extender: [name] in Treatment: 6 Verbal / Phone Orders: No Diagnosis Coding ICD-10 Coding Code Description E11.621 Type 2 diabetes mellitus with foot ulcer L97.528 Non-pressure chronic ulcer of other part of left foot with other specified severity L97.511 Non-pressure chronic ulcer of other part of right foot limited to breakdown of skin L89.622 Pressure ulcer of left heel, stage 2 E11.42 Type 2 diabetes mellitus with diabetic polyneuropathy Follow-up Appointments ppointment in 2 weeks. - with Dr. Dellia Nims Return A Bathing/ Shower/ Hygiene May shower and wash wound with soap and water. - with dressing changes only. Off-Loading Turn and reposition every 2 hours Other: - float heels while resting in bed or chair. Use a pillow or rolled sheet under legs to offload the heels. Home Health No change in wound care orders this week; continue Home Health for wound care. May utilize formulary equivalent dressing for wound treatment orders unless otherwise specified. Other Home Health Orders/Instructions: - [facility] home health twice a week and family once a week. Wound Treatment Wound #1 - Calcaneus Wound Laterality: Left Cleanser: Soap and Water (Home Health) 3 x Per Week/30 Days Discharge Instructions: May shower and wash wound with dial antibacterial soap and water prior to dressing change. Cleanser: Wound Cleanser (Home Health) 3 x Per Week/30 Days Discharge Instructions: Cleanse the wound with wound cleanser prior to applying a clean dressing using gauze sponges, not tissue or cotton balls. Prim Dressing: Hydrofera Blue Classic Foam, 4x4 in (Home Health) 3 x Per Week/30 Days ary Discharge Instructions: Moisten with saline prior to applying to wound bed Prim Dressing: Santyl Ointment ([place]) 3 x Per Week/30 Days ary Discharge Instructions: Apply nickel  thick amount to wound bed as instructed Secondary Dressing: Woven Gauze Sponge, Non-Sterile 4x4 in (Home Health) 3 x Per Week/30 Days Discharge Instructions: Apply over  primary dressing as directed. Secondary Dressing: ALLEVYN Heel 4 1/2in x 5 1/2in / 10.5cm x 13.5cm (Home Health) 3 x Per Week/30 Days Discharge Instructions: Apply over primary dressing as directed. Secured With: [employer], 4.5x3.1 (in/yd) (Home Health) 3 x Per Week/30 Days Discharge Instructions: Secure with Kerlix as directed. Secured With: 40M Medipore H Soft Cloth Surgical T 4 x 2 (in/yd) (Home Health) 3 x Per Week/30 Days ape Discharge Instructions: Secure dressing with tape as directed. Wound #2 - Foot Wound Laterality: Left, Lateral Cleanser: Soap and Water (Home Health) 3 x Per Week/30 Days Discharge Instructions: May shower and wash wound with dial antibacterial soap and water prior to dressing change. Cleanser: Wound Cleanser (Home Health) 3 x Per Week/30 Days Discharge Instructions: Cleanse the wound with wound cleanser prior to applying a clean dressing using gauze sponges, not tissue or cotton balls. Prim Dressing: Hydrofera Blue Classic Foam, 4x4 in (Home Health) 3 x Per Week/30 Days ary Discharge Instructions: Moisten with saline prior to applying to wound bed Prim Dressing: Santyl Ointment ([place]) 3 x Per Week/30 Days ary Discharge Instructions: Apply nickel thick amount to wound bed as instructed Secondary Dressing: Woven Gauze Sponge, Non-Sterile 4x4 in (Home Health) 3 x Per Week/30 Days Discharge Instructions: Apply over primary dressing as directed. Secured With: [employer], 4.5x3.1 (in/yd) (Home Health) 3 x Per Week/30 Days Discharge Instructions: Secure with Kerlix as directed. Secured With: 40M Medipore H Soft Cloth Surgical T 4 x 2 (in/yd) (Home Health) 3 x Per Week/30 Days ape Discharge Instructions: Secure dressing with tape as directed. Wound #3 - Foot Wound Laterality:  Right, Lateral Cleanser: Soap and Water (Home Health) 3 x Per Week/30 Days Discharge Instructions: May shower and wash wound with dial antibacterial soap and water prior to dressing change. Cleanser: Wound Cleanser (Home Health) 3 x Per Week/30 Days Discharge Instructions: Cleanse the wound with wound cleanser prior to applying a clean dressing using gauze sponges, not tissue or cotton balls. Prim Dressing: Hydrofera Blue Classic Foam, 4x4 in (Home Health) 3 x Per Week/30 Days ary Discharge Instructions: Moisten with saline prior to applying to wound bed Prim Dressing: Santyl Ointment ([place]) 3 x Per Week/30 Days ary Discharge Instructions: Apply nickel thick amount to wound bed as instructed Secondary Dressing: Woven Gauze Sponge, Non-Sterile 4x4 in (Home Health) 3 x Per Week/30 Days Discharge Instructions: Apply over primary dressing as directed. Secured With: [employer], 4.5x3.1 (in/yd) (Home Health) 3 x Per Week/30 Days Discharge Instructions: Secure with Kerlix as directed. Secured With: 40M Medipore H Soft Cloth Surgical T 4 x 2 (in/yd) (Home Health) 3 x Per Week/30 Days ape Discharge Instructions: Secure dressing with tape as directed. Electronic Signature(s) Signed: 08/18/2020 4:52:40 PM By: Linton Ham MD Signed: 08/18/2020 6:20:17 PM By: Levan Hurst RN, BSN Entered By: Levan Hurst on 08/18/2020 08:22:20 -------------------------------------------------------------------------------- Problem List Details Patient Name: Date of Service: [name] [facility] N L. 08/18/2020 8:00 A M Medical Record Number: [id number] Patient Account Number: [id number] Date of Birth/Sex: Treating RN: 1955/02/15 (65 y.o. [name] Primary Care Provider: [name] Other Clinician: Referring Provider: Treating Provider/Extender: [name] in Treatment: 6 Active Problems ICD-10 Encounter Code Description Active Date MDM Diagnosis E11.621  Type 2 diabetes mellitus with foot ulcer 07/07/2020 No Yes L97.528 Non-pressure chronic ulcer of other part of left foot with other specified 07/07/2020 No Yes severity L97.511 Non-pressure chronic ulcer of other part of right foot limited to  breakdown of 07/07/2020 No Yes skin L89.622 Pressure ulcer of left heel, stage 2 07/07/2020 No Yes E11.42 Type 2 diabetes mellitus with diabetic polyneuropathy 07/07/2020 No Yes Inactive Problems ICD-10 Code Description Active Date Inactive Date L89.152 Pressure ulcer of sacral region, stage 2 07/07/2020 07/07/2020 Resolved Problems Electronic Signature(s) Signed: 08/18/2020 4:52:40 PM By: Linton Ham MD Entered By: Linton Ham on 08/18/2020 08:34:30 -------------------------------------------------------------------------------- Progress Note Details Patient Name: Date of Service: [name] [facility] N L. 08/18/2020 8:00 A M Medical Record Number: [id number] Patient Account Number: [id number] Date of Birth/Sex: Treating RN: 18-Aug-1955 (65 y.o. [name] Primary Care Provider: [name] Other Clinician: Referring Provider: Treating Provider/Extender: [name] in Treatment: 6 Subjective History of Present Illness (HPI) ADMISSION 07/07/2020 This is a 65 year old man who is a type II diabetic. I have reviewed this in epic. It appeared that his diabetes was diagnosed in 2009 at which time he was on metformin. He was followed for a period of time by an endocrinologist with [facility] to always labeled him as a type II diabetic. Recently in epic there is been a switch to type 1 diabetes. If there were defining antibody test done I do not see them and I think the totality of the evidence would suggest that he is a type II diabetic. His recent problem started in May when he fell down 17 stairs. He suffered a scapular fracture he was hospitalized at [facility] for 9 days and then discharged to [facility] His mother who is present  said that most of these wounds seem to develop surrounding the discharge from hospital or the stay at [facility]. He has an area on his lower sacrum which appears currently to be a stage II but per their description this is actually improved quite a bit. He has an area on the left lateral foot at roughly the base of the fifth metatarsal, the left Achilles heel and the right fifth metatarsal head laterally they have been using topical antibiotics. Seen by primary care yesterday and placed on Keflex The patient had arterial studies on 05/05/2020. I wondered whether this had something to do with wounds on his feet at that time but I have not been able to determine this. At that time his ABI on the right was 1.09 with a TBI of 0.65 on the left 0.88 with a TBI of 0.44. Waveforms were triphasic on the right monophasic on the left. He likely has significant PAD on the left Past medical history includes type 2 diabetes poorly controlled last hemoglobin A1c at 12.7 in May, hypertension, hyperlipidemia, atrial fib on Eliquis, coronary artery disease status post CABG, left scapular fracture in May of this year, AAA repair. Bilateral leg weakness ABIs were not done in our clinic as they were just done in May of this year at [facility] 7/14; patient arrives back in clinic he has areas on the left Achilles heel, base of the left fifth metatarsal, right lateral fifth metatarsal. The area he had over the coccyx appears to be healed but this area remains vulnerable. We have been using Iodoflex. I rereviewed his ABIs which were done in May. I do not know exactly what prompted these test however they do show a TBI of 0.44 on the left and monophasic waveforms. 7/21; the patient's wounds are about the same. Certainly no healing and the nurses noted some green drainage on the right fifth metatarsal head, left Achilles a little larger. We have been  using silver alginate really without any improvement. We did get an  appointment with vascular surgery however its that it did the end of August and they seem to want to repeat the noninvasive test that they have already done in May. I am not sure what the issue here is. We advised the patient's sister to call and point to the idea that they have already had noninvasive studies. The real question I have is does this patient need an angiogram. He is a diabetic and it is possible that the ABIs are falsely elevated because of medial calcification. At least on the left his TBI was abnormal 08/08/2020 upon evaluation today patient appears to be doing about the same in regard to his wounds. He still waiting the appointment with vascular. This is something that hopefully should be undertaken shortly. The 29th of this month is when he is actually scheduled although they have put in a call to move this up if at all possible. Fortunately there does not appear to be any signs of active infection at this time. No fevers, chills, nausea, vomiting, or diarrhea. 8/18; appointment with Dr. Trula Slade on 8/29. The patient has wounds on the left Achilles heel, left lateral foot and the right lateral fifth metatarsal head. We have been using Santyl under Hydrofera Blue. The surface of especially the left heel looks better to me than what I remember Objective Constitutional Patient is hypertensive.. Pulse regular and within target range for patient.Marland Kitchen Respirations regular, non-labored and within target range.. Temperature is normal and within the target range for the patient.Marland Kitchen Appears in no distress. Vitals Time Taken: 7:54 AM, Height: 74 in, Weight: 151 lbs, BMI: 19.4, Temperature: 98.3 F, Pulse: 69 bpm, Respiratory Rate: 16 breaths/min, Blood Pressure: 151/78 mmHg, Capillary Blood Glucose: 316 mg/dl. General Notes: glucose per pt report General Notes: Wound exam; the left Achilles heel has 100% covered with tightly adherent necrotic material although there is certainly more visible  granulation than what I remember. I used a #5 curette to remove this this was a subcutaneous debridement hemostasis with a pressure dressing oo The left lateral foot does not require debridement it seems smaller and more epithelialized oo On the right lateral fifth metatarsal head again 100% covered in a very tight adherent surface. I used a #5 curette to remove this hemostasis with silver nitrate and pressure Integumentary (Hair, Skin) Wound #1 status is Open. Original cause of wound was Pressure Injury. The date acquired was: 05/23/2020. The wound has been in treatment 6 weeks. The wound is located on the Left Calcaneus. The wound measures 3.7cm length x 2.3cm width x 0.1cm depth; 6.684cm^2 area and 0.668cm^3 volume. There is Fat Layer (Subcutaneous Tissue) exposed. There is no tunneling or undermining noted. There is a medium amount of serosanguineous drainage noted. The wound margin is flat and intact. There is small (1-33%) pink granulation within the wound bed. There is a large (67-100%) amount of necrotic tissue within the wound bed including Adherent Slough. Wound #2 status is Open. Original cause of wound was Gradually Appeared. The date acquired was: 06/21/2020. The wound has been in treatment 6 weeks. The wound is located on the Left,Lateral Foot. The wound measures 1.1cm length x 1cm width x 0.1cm depth; 0.864cm^2 area and 0.086cm^3 volume. There is Fat Layer (Subcutaneous Tissue) exposed. There is no tunneling or undermining noted. There is a medium amount of serosanguineous drainage noted. The wound margin is distinct with the outline attached to the wound base. There is medium (  34-66%) red granulation within the wound bed. There is a medium (34-66%) amount of necrotic tissue within the wound bed including Adherent Slough. Wound #3 status is Open. Original cause of wound was Gradually Appeared. The date acquired was: 06/21/2020. The wound has been in treatment 6 weeks. The wound is  located on the Right,Lateral Foot. The wound measures 1.1cm length x 1.2cm width x 0.1cm depth; 1.037cm^2 area and 0.104cm^3 volume. There is Fat Layer (Subcutaneous Tissue) exposed. There is no tunneling or undermining noted. There is a medium amount of serosanguineous drainage noted. The wound margin is distinct with the outline attached to the wound base. There is small (1-33%) red, pink granulation within the wound bed. There is a large (67- 100%) amount of necrotic tissue within the wound bed including Adherent Slough. 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 There was a Excisional Skin/Subcutaneous Tissue Debridement with a total area of 8.51 sq cm performed by Ricard Dillon., MD. With the following instrument(s): Curette to remove Viable and Non-Viable tissue/material. Material removed includes Subcutaneous Tissue and Slough and. No specimens were taken. A time out was conducted at 08:15, prior to the start of the procedure. A Minimum amount of bleeding was controlled with Pressure. The procedure was tolerated well with a pain level of 0 throughout and a pain level of 0 following the procedure. Post Debridement Measurements: 3.7cm length x 2.3cm width x 0.1cm depth; 0.668cm^3 volume. Post debridement Stage noted as Category/Stage III. Character of Wound/Ulcer Post Debridement requires further debridement. Post procedure Diagnosis Wound #1: Same as Pre-Procedure Wound #3 Pre-procedure diagnosis of Wound #3 is a Diabetic Wound/Ulcer of the Lower Extremity located on the Right,Lateral Foot  .Severity of Tissue Pre Debridement is: Fat layer exposed. There was a Excisional Skin/Subcutaneous Tissue Debridement with a total area of 1.32 sq cm performed by Ricard Dillon., MD. With the following instrument(s): Curette to remove Viable and Non-Viable tissue/material. Material removed includes Subcutaneous Tissue and Slough and. No specimens were taken. A time out was conducted at 08:15, prior to the start of the procedure. A Minimum amount of bleeding was controlled with Pressure. The procedure was tolerated well with a pain level of 0 throughout and a pain level of 0 following the procedure. Post Debridement Measurements: 1.1cm length x 1.2cm width x 0.1cm depth; 0.104cm^3 volume. Character of Wound/Ulcer Post Debridement requires further debridement. Severity of Tissue Post Debridement is: Fat layer exposed. Post procedure Diagnosis Wound #3: Same as Pre-Procedure Plan Follow-up Appointments: Return Appointment in 2 weeks. - with Dr. Dellia Nims Bathing/ Shower/ Hygiene: May shower and wash wound with soap and water. - with dressing changes only. Off-Loading: Turn and reposition every 2 hours Other: - float heels while resting in bed or chair. Use a pillow or rolled sheet under legs to offload the heels. Home Health: No change in wound care orders this week; continue Home Health for wound care. May utilize formulary equivalent dressing for wound treatment orders unless otherwise specified. Other Home Health Orders/Instructions: - [facility] home health twice a week and family once a week. WOUND #1: - Calcaneus Wound Laterality: Left Cleanser: Soap and Water (  Home Health) 3 x Per Week/30 Days Discharge Instructions: May shower and wash wound with dial antibacterial soap and water prior to dressing change. Cleanser: Wound Cleanser (Home Health) 3 x Per Week/30 Days Discharge Instructions: Cleanse the wound with wound cleanser prior to applying a clean dressing using gauze sponges, not  tissue or cotton balls. Prim Dressing: Hydrofera Blue Classic Foam, 4x4 in (Home Health) 3 x Per Week/30 Days ary Discharge Instructions: Moisten with saline prior to applying to wound bed Prim Dressing: Santyl Ointment ([place]) 3 x Per Week/30 Days ary Discharge Instructions: Apply nickel thick amount to wound bed as instructed Secondary Dressing: Woven Gauze Sponge, Non-Sterile 4x4 in (Home Health) 3 x Per Week/30 Days Discharge Instructions: Apply over primary dressing as directed. Secondary Dressing: ALLEVYN Heel 4 1/2in x 5 1/2in / 10.5cm x 13.5cm (Home Health) 3 x Per Week/30 Days Discharge Instructions: Apply over primary dressing as directed. Secured With: [employer], 4.5x3.1 (in/yd) (Home Health) 3 x Per Week/30 Days Discharge Instructions: Secure with Kerlix as directed. Secured With: 46M Medipore H Soft Cloth Surgical T 4 x 2 (in/yd) (Home Health) 3 x Per Week/30 Days ape Discharge Instructions: Secure dressing with tape as directed. WOUND #2: - Foot Wound Laterality: Left, Lateral Cleanser: Soap and Water (Home Health) 3 x Per Week/30 Days Discharge Instructions: May shower and wash wound with dial antibacterial soap and water prior to dressing change. Cleanser: Wound Cleanser (Home Health) 3 x Per Week/30 Days Discharge Instructions: Cleanse the wound with wound cleanser prior to applying a clean dressing using gauze sponges, not tissue or cotton balls. Prim Dressing: Hydrofera Blue Classic Foam, 4x4 in (Home Health) 3 x Per Week/30 Days ary Discharge Instructions: Moisten with saline prior to applying to wound bed Prim Dressing: Santyl Ointment ([place]) 3 x Per Week/30 Days ary Discharge Instructions: Apply nickel thick amount to wound bed as instructed Secondary Dressing: Woven Gauze Sponge, Non-Sterile 4x4 in (Home Health) 3 x Per Week/30 Days Discharge Instructions: Apply over primary dressing as directed. Secured With: [employer], 4.5x3.1  (in/yd) (Home Health) 3 x Per Week/30 Days Discharge Instructions: Secure with Kerlix as directed. Secured With: 46M Medipore H Soft Cloth Surgical T 4 x 2 (in/yd) (Home Health) 3 x Per Week/30 Days ape Discharge Instructions: Secure dressing with tape as directed. WOUND #3: - Foot Wound Laterality: Right, Lateral Cleanser: Soap and Water (Home Health) 3 x Per Week/30 Days Discharge Instructions: May shower and wash wound with dial antibacterial soap and water prior to dressing change. Cleanser: Wound Cleanser (Home Health) 3 x Per Week/30 Days Discharge Instructions: Cleanse the wound with wound cleanser prior to applying a clean dressing using gauze sponges, not tissue or cotton balls. Prim Dressing: Hydrofera Blue Classic Foam, 4x4 in (Home Health) 3 x Per Week/30 Days ary Discharge Instructions: Moisten with saline prior to applying to wound bed Prim Dressing: Santyl Ointment ([place]) 3 x Per Week/30 Days ary Discharge Instructions: Apply nickel thick amount to wound bed as instructed Secondary Dressing: Woven Gauze Sponge, Non-Sterile 4x4 in (Home Health) 3 x Per Week/30 Days Discharge Instructions: Apply over primary dressing as directed. Secured With: [employer], 4.5x3.1 (in/yd) (Home Health) 3 x Per Week/30 Days Discharge Instructions: Secure with Kerlix as directed. Secured With: 46M Medipore H Soft Cloth Surgical T 4 x 2 (in/yd) (Home Health) 3 x Per Week/30 Days ape Discharge Instructions: Secure dressing with tape as directed. 1. I am continuing with the [facility]  dressing to all wounds 2. His wife is going to change these daily. Santyl is really a daily application. I am really most concerned with getting the surface of this wound down to a viable surface for epithelialization 3. He certainly has brisk bleeding. I have sent him for vascular to see if he needs an angiogram Electronic Signature(s) Signed: 08/18/2020 4:52:40 PM By: Linton Ham  MD Entered By: Linton Ham on 08/18/2020 08:40:32 -------------------------------------------------------------------------------- SuperBill Details Patient Name: Date of Service: [name] HNA THA N L. 08/18/2020 Medical Record Number: [id number] Patient Account Number: [id number] Date of Birth/Sex: Treating RN: November 11, 1955 (65 y.o. [name], [name] Primary Care Provider: [name] Other Clinician: Referring Provider: Treating Provider/Extender: [name] in Treatment: 6 Diagnosis Coding ICD-10 Codes Code Description [phone] Type 2 diabetes mellitus with foot ulcer L97.528 Non-pressure chronic ulcer of other part of left foot with other specified severity L97.511 Non-pressure chronic ulcer of other part of right foot limited to breakdown of skin L89.622 Pressure ulcer of left heel, stage 2 E11.42 Type 2 diabetes mellitus with diabetic polyneuropathy Facility Procedures CPT4 Code: [id number] Description: [id number] - DEB SUBQ TISSUE 20 SQ CM/< ICD-10 Diagnosis Description L97.528 Non-pressure chronic ulcer of other part of left foot with other specified sev L97.511 Non-pressure chronic ulcer of other part of right foot limited to breakdown of Modifier: erity skin Quantity: 1 Physician Procedures : CPT4 Code Description Modifier [id number] - WC PHYS SUBQ TISS 20 SQ CM ICD-10 Diagnosis Description L97.528 Non-pressure chronic ulcer of other part of left foot with other specified severity L97.511 Non-pressure chronic ulcer of other part of right  foot limited to breakdown of skin Quantity: 1 Electronic Signature(s) Signed: 08/18/2020 4:52:40 PM By: Linton Ham MD Entered By: Linton Ham on 08/18/2020 08:41:07

## 2020-08-22 DIAGNOSIS — S42102D Fracture of unspecified part of scapula, left shoulder, subsequent encounter for fracture with routine healing: Secondary | ICD-10-CM | POA: Diagnosis not present

## 2020-08-22 DIAGNOSIS — L89150 Pressure ulcer of sacral region, unstageable: Secondary | ICD-10-CM | POA: Diagnosis not present

## 2020-08-23 ENCOUNTER — Telehealth: Payer: Self-pay | Admitting: Lab

## 2020-08-23 NOTE — Chronic Care Management (AMB) (Signed)
  Chronic Care Management   Note  08/23/2020 Name: Loren Sawaya Handcock MRN: [id number] DOB: 1955/11/11  Cindy Austgen is a 65 y.o. year old male who is a primary care patient of Janith Lima, MD. I reached out to Brooke Pace by phone today in response to a referral sent by Mr. Vincen Penta Lorino's PCP, Janith Lima, MD.   Mr. Simcik was given information about Chronic Care Management services today including:  CCM service includes personalized support from designated clinical staff supervised by his physician, including individualized plan of care and coordination with other care providers 24/7 contact phone numbers for assistance for urgent and routine care needs. Service will only be billed when office clinical staff spend 20 minutes or more in a month to coordinate care. Only one practitioner may furnish and bill the service in a calendar month. The patient may stop CCM services at any time (effective at the end of the month) by phone call to the office staff.   Patient agreed to services and verbal consent obtained.   Follow up plan:   [place]

## 2020-08-25 DIAGNOSIS — L89154 Pressure ulcer of sacral region, stage 4: Secondary | ICD-10-CM | POA: Diagnosis not present

## 2020-08-25 DIAGNOSIS — L97519 Non-pressure chronic ulcer of other part of right foot with unspecified severity: Secondary | ICD-10-CM | POA: Diagnosis not present

## 2020-08-25 DIAGNOSIS — L97529 Non-pressure chronic ulcer of other part of left foot with unspecified severity: Secondary | ICD-10-CM | POA: Diagnosis not present

## 2020-08-25 DIAGNOSIS — L97429 Non-pressure chronic ulcer of left heel and midfoot with unspecified severity: Secondary | ICD-10-CM | POA: Diagnosis not present

## 2020-08-29 ENCOUNTER — Other Ambulatory Visit: Payer: Self-pay

## 2020-08-29 ENCOUNTER — Ambulatory Visit (HOSPITAL_COMMUNITY)
Admission: RE | Admit: 2020-08-29 | Discharge: 2020-08-29 | Disposition: A | Discharge status: Home / Self Care | Payer: HMO | Source: Ambulatory Visit | Attending: Surgery | Admitting: Surgery

## 2020-08-29 ENCOUNTER — Encounter: Payer: Self-pay | Admitting: Surgery

## 2020-08-29 ENCOUNTER — Ambulatory Visit: Payer: HMO | Admitting: Surgery

## 2020-08-29 VITALS — BP 105/72 | HR 76 | Temp 98.1°F | Resp 16 | Ht 74.0 in | Wt 158.0 lb

## 2020-08-29 DIAGNOSIS — I7025 Atherosclerosis of native arteries of other extremities with ulceration: Secondary | ICD-10-CM | POA: Diagnosis not present

## 2020-08-29 DIAGNOSIS — I739 Peripheral vascular disease, unspecified: Secondary | ICD-10-CM | POA: Diagnosis not present

## 2020-08-29 NOTE — Progress Notes (Signed)
[facility] [place]  Patient name: [name] [name] MRN: [id number] DOB: 06/21/1955 Sex: male   REQUESTING PROVIDER:    Dr. Dellia Nims   REASON FOR CONSULT:    wound  HISTORY OF PRESENT ILLNESS:   Jameire Kouba is a 65 y.o. male, who is referred for evaluation of a left heel wound which has been present for approximately 2 months.  Prior to that, he did not really complain of any claudication symptoms although he was having some leg pain.  He is getting treated at the wound center.  The patient suffers from diabetes.  He is scheduled to get a insulin monitor in the near future.  He has a history of coronary artery disease, status post CABG.  Takes a statin for hypercholesterolemia and is medically managed for hypertension.  He is an occasional smoker.  PAST MEDICAL HISTORY    Past Medical History:  Diagnosis Date   Anxiety    Arthritis    Constipation    pt states goes EOD- hard stools    Coronary artery disease    Depression    Diabetes mellitus without complication (HCC)    DKA (diabetic ketoacidoses)    Hyperlipidemia    Hypertension    off meds   Psoriasis      FAMILY HISTORY   Family History  Problem Relation Age of Onset   Lymphoma Father    Cancer Paternal Grandmother        Colon Cancer   Colon cancer Paternal Grandmother    Colon polyps Neg Hx    Esophageal cancer Neg Hx    Rectal cancer Neg Hx    Stomach cancer Neg Hx    Diabetes Mellitus I Neg Hx     SOCIAL HISTORY:   Social History   Socioeconomic History   Marital status: Divorced    Spouse name: Not on file   Number of children: 4   Years of education: 12   Highest education level: 12th grade  Occupational History   Occupation: Retired  Tobacco Use   Smoking status: Some Days    Types: Cigars   Smokeless tobacco: Never  Vaping Use   Vaping Use: Never used  Substance and Sexual Activity   Alcohol use: Yes    Comment:  daily beer or scotch    Drug use: No   Sexual activity: Yes  Other Topics Concern   Not on file  Social History Narrative   Lives with his adult son [name].   Has 4 children, 2 sons- both live in [place], 2 daughters one lives in [place], [place] and youngest daughter attends [employer].    Fiance lives in  [place], [place]   Social Determinants of Health   Financial Resource Strain: Unknown   Difficulty of Paying Living Expenses: Patient refused  Food Insecurity: No Food Insecurity   Worried About Charity fundraiser in the Last Year: Never true   Ran Out of Food in the Last Year: Never true  Transportation Needs: No Transportation Needs   Lack of Transportation (Medical): No   Lack of Transportation (Non-Medical): No  Physical Activity: Inactive   Days of Exercise per Week: 0 days   Minutes of Exercise per Session: 0 min  Stress: Stress Concern Present   Feeling of Stress : Very much  Social Connections: Socially Isolated   Frequency of Communication with Friends and Family: More than three times a week   Frequency of Social Gatherings with  Friends and Family: More than three times a week   Attends Religious Services: Never   Marine scientist or Organizations: No   Attends Music therapist: Never   Marital Status: Divorced  Human resources officer Violence: Not At Risk   Fear of Current or Ex-Partner: No   Emotionally Abused: No   Physically Abused: No   Sexually Abused: No    ALLERGIES:    No Known Allergies  CURRENT MEDICATIONS:    Current Outpatient Medications  Medication Sig Dispense Refill   aspirin EC 325 MG tablet Take 325 mg by mouth daily.     atorvastatin (LIPITOR) 40 MG tablet Take 1 tablet (40 mg total) by mouth daily. 90 tablet 1   B Complex Vitamins (B COMPLEX-B12 PO) Take 1 tablet by mouth daily.      Cholecalciferol (VITAMIN D) 125 MCG (5000 UT) CAPS Take 5,000 Units by mouth daily.      Continuous Blood Gluc Sensor  (DEXCOM G6 SENSOR) MISC 1 Device by Does not apply route See admin instructions. Change every 10 days 9 each 3   Insulin Aspart FlexPen 100 UNIT/ML SOPN Inject 8 Units into the skin 2 (two) times daily with a meal. And pen needles 3/day 15 mL 3   Insulin Disposable Pump (OMNIPOD 5 G6 INTRO, GEN 5,) KIT 1 Device by Does not apply route every 3 (three) days. 30 kit 3   insulin glargine (LANTUS SOLOSTAR) 100 UNIT/ML Solostar Pen Inject 20 Units into the skin every morning. 30 mL PRN   Insulin Pen Needle (DROPLET PEN NEEDLES) 32G X 4 MM MISC Use daily for glucose control 100 each 5   mirtazapine (REMERON) 15 MG tablet Take 1 tablet (15 mg total) by mouth at bedtime. 90 tablet 1   PARoxetine (PAXIL) 20 MG tablet Take 1 tablet (20 mg total) by mouth daily. 90 tablet 1   SANTYL ointment Apply 1 application topically daily.     traZODone (DESYREL) 50 MG tablet Take 1 tablet (50 mg total) by mouth at bedtime. 90 tablet 1   lisinopril (ZESTRIL) 5 MG tablet TAKE 1 TABLET BY MOUTH DAILY (Patient not taking: Reported on 08/29/2020) 90 tablet 1   No current facility-administered medications for this visit.    REVIEW OF SYSTEMS:   [X]  denotes positive finding, [ ]  denotes negative finding Cardiac  Comments:  Chest pain or chest pressure:    Shortness of breath upon exertion:    Short of breath when lying flat:    Irregular heart rhythm:        Vascular    Pain in calf, thigh, or hip brought on by ambulation: x   Pain in feet at night that wakes you up from your sleep:  x   Blood clot in your veins:    Leg swelling:  x       Pulmonary    Oxygen at home:    Productive cough:     Wheezing:         Neurologic    Sudden weakness in arms or legs:  x   Sudden numbness in arms or legs:     Sudden onset of difficulty speaking or slurred speech:    Temporary loss of vision in one eye:     Problems with dizziness:  x       Gastrointestinal    Blood in stool:      Vomited blood:          Genitourinary  Burning when urinating:     Blood in urine:        Psychiatric    Major depression:         Hematologic    Bleeding problems:    Problems with blood clotting too easily:        Skin    Rashes or ulcers:        Constitutional    Fever or chills:     PHYSICAL EXAM:   Vitals:   08/29/20 1133  BP: 105/72  Pulse: 76  Resp: 16  Temp: 98.1 F (36.7 C)  TempSrc: Temporal  SpO2: 97%  Weight: 158 lb (71.7 kg)  Height: 6' 2"  (1.88 m)    GENERAL: The patient is a well-nourished male, in no acute distress. The vital signs are documented above. CARDIAC: There is a regular rate and rhythm.  VASCULAR: Palpable femoral pulses, nonpalpable pedal pulses PULMONARY: Nonlabored respirations ABDOMEN: Soft and non-tender with normal pitched bowel sounds.  MUSCULOSKELETAL: There are no major deformities or cyanosis. NEUROLOGIC: No focal weakness or paresthesias are detected. SKIN: See photo below  PSYCHIATRIC: The patient has a normal affect.   STUDIES:   I have reviewed the following vascular lab studies: +-------+-----------+-----------+------------+------------+  ABI/TBIToday's ABIToday's TBIPrevious ABIPrevious TBI  +-------+-----------+-----------+------------+------------+  Right  0.9        0.75       1.09        0.65          +-------+-----------+-----------+------------+------------+  Left   0.64       0.35       0.88        0.44          +-------+-----------+-----------+------------+------------+  Right toe pressure is 100 Left toe pressure is 46 ASSESSMENT and PLAN   Left heel ulcer in setting of diabetes: I discussed with the patient that this is a limb threatening situation.  We discussed proceeding with angiography to evaluate his blood flow and intervene as indicated.  This will be from a right femoral approach.  We discussed the risk of bleeding and distal embolization.  All questions were answered.  I am scheduling this for  tomorrow Tuesday, August 30   Leia Alf, MD, [facility] [phone] Pager [phone]

## 2020-08-29 NOTE — H&P (View-Only) (Signed)
[facility]  Patient name: [name] [name] MRN: [id number] DOB: 1955-07-01 Sex: male   REQUESTING PROVIDER:    Dr. Dellia Nims   REASON FOR CONSULT:    wound  HISTORY OF PRESENT ILLNESS:   [name] [name] is a 65 y.o. male, who is referred for evaluation of a left heel wound which has been present for approximately 2 months.  Prior to that, he did not really complain of any claudication symptoms although he was having some leg pain.  He is getting treated at the wound center.  The patient suffers from diabetes.  He is scheduled to get a insulin monitor in the near future.  He has a history of coronary artery disease, status post CABG.  Takes a statin for hypercholesterolemia and is medically managed for hypertension.  He is an occasional smoker.  PAST MEDICAL HISTORY    Past Medical History:  Diagnosis Date   Anxiety    Arthritis    Constipation    pt states goes EOD- hard stools    Coronary artery disease    Depression    Diabetes mellitus without complication (HCC)    DKA (diabetic ketoacidoses)    Hyperlipidemia    Hypertension    off meds   Psoriasis      FAMILY HISTORY   Family History  Problem Relation Age of Onset   Lymphoma Father    Cancer Paternal Grandmother        Colon Cancer   Colon cancer Paternal Grandmother    Colon polyps Neg Hx    Esophageal cancer Neg Hx    Rectal cancer Neg Hx    Stomach cancer Neg Hx    Diabetes Mellitus I Neg Hx     SOCIAL HISTORY:   Social History   Socioeconomic History   Marital status: Divorced    Spouse name: Not on file   Number of children: 4   Years of education: 12   Highest education level: 12th grade  Occupational History   Occupation: Retired  Tobacco Use   Smoking status: Some Days    Types: Cigars   Smokeless tobacco: Never  Vaping Use   Vaping Use: Never used  Substance and Sexual Activity   Alcohol use: Yes    Comment:  daily beer or scotch    Drug use: No   Sexual activity: Yes  Other Topics Concern   Not on file  Social History Narrative   Lives with his adult son [name] [name].   Has 4 children, 2 sons- both live in [place], 2 daughters one lives in [place], [place] and youngest daughter attends [employer].    Fiance lives in  [place], [place]   Social Determinants of Health   Financial Resource Strain: Unknown   Difficulty of Paying Living Expenses: Patient refused  Food Insecurity: No Food Insecurity   Worried About Charity fundraiser in the Last Year: Never true   Ran Out of Food in the Last Year: Never true  Transportation Needs: No Transportation Needs   Lack of Transportation (Medical): No   Lack of Transportation (Non-Medical): No  Physical Activity: Inactive   Days of Exercise per Week: 0 days   Minutes of Exercise per Session: 0 min  Stress: Stress Concern Present   Feeling of Stress : Very much  Social Connections: Socially Isolated   Frequency of Communication with Friends and Family: More than three times a week   Frequency of Social Gatherings with  Friends and Family: More than three times a week   Attends Religious Services: Never   Marine scientist or Organizations: No   Attends Music therapist: Never   Marital Status: Divorced  Human resources officer Violence: Not At Risk   Fear of Current or Ex-Partner: No   Emotionally Abused: No   Physically Abused: No   Sexually Abused: No    ALLERGIES:    No Known Allergies  CURRENT MEDICATIONS:    Current Outpatient Medications  Medication Sig Dispense Refill   aspirin EC 325 MG tablet Take 325 mg by mouth daily.     atorvastatin (LIPITOR) 40 MG tablet Take 1 tablet (40 mg total) by mouth daily. 90 tablet 1   B Complex Vitamins (B COMPLEX-B12 PO) Take 1 tablet by mouth daily.      Cholecalciferol (VITAMIN D) 125 MCG (5000 UT) CAPS Take 5,000 Units by mouth daily.      Continuous Blood Gluc Sensor  (DEXCOM G6 SENSOR) MISC 1 Device by Does not apply route See admin instructions. Change every 10 days 9 each 3   Insulin Aspart FlexPen 100 UNIT/ML SOPN Inject 8 Units into the skin 2 (two) times daily with a meal. And pen needles 3/day 15 mL 3   Insulin Disposable Pump (OMNIPOD 5 G6 INTRO, GEN 5,) KIT 1 Device by Does not apply route every 3 (three) days. 30 kit 3   insulin glargine (LANTUS SOLOSTAR) 100 UNIT/ML Solostar Pen Inject 20 Units into the skin every morning. 30 mL PRN   Insulin Pen Needle (DROPLET PEN NEEDLES) 32G X 4 MM MISC Use daily for glucose control 100 each 5   mirtazapine (REMERON) 15 MG tablet Take 1 tablet (15 mg total) by mouth at bedtime. 90 tablet 1   PARoxetine (PAXIL) 20 MG tablet Take 1 tablet (20 mg total) by mouth daily. 90 tablet 1   SANTYL ointment Apply 1 application topically daily.     traZODone (DESYREL) 50 MG tablet Take 1 tablet (50 mg total) by mouth at bedtime. 90 tablet 1   lisinopril (ZESTRIL) 5 MG tablet TAKE 1 TABLET BY MOUTH DAILY (Patient not taking: Reported on 08/29/2020) 90 tablet 1   No current facility-administered medications for this visit.    REVIEW OF SYSTEMS:   [X]  denotes positive finding, [ ]  denotes negative finding Cardiac  Comments:  Chest pain or chest pressure:    Shortness of breath upon exertion:    Short of breath when lying flat:    Irregular heart rhythm:        Vascular    Pain in calf, thigh, or hip brought on by ambulation: x   Pain in feet at night that wakes you up from your sleep:  x   Blood clot in your veins:    Leg swelling:  x       Pulmonary    Oxygen at home:    Productive cough:     Wheezing:         Neurologic    Sudden weakness in arms or legs:  x   Sudden numbness in arms or legs:     Sudden onset of difficulty speaking or slurred speech:    Temporary loss of vision in one eye:     Problems with dizziness:  x       Gastrointestinal    Blood in stool:      Vomited blood:          Genitourinary  Burning when urinating:     Blood in urine:        Psychiatric    Major depression:         Hematologic    Bleeding problems:    Problems with blood clotting too easily:        Skin    Rashes or ulcers:        Constitutional    Fever or chills:     PHYSICAL EXAM:   Vitals:   08/29/20 1133  BP: 105/72  Pulse: 76  Resp: 16  Temp: 98.1 F (36.7 C)  TempSrc: Temporal  SpO2: 97%  Weight: 158 lb (71.7 kg)  Height: 6' 2"  (1.88 m)    GENERAL: The patient is a well-nourished male, in no acute distress. The vital signs are documented above. CARDIAC: There is a regular rate and rhythm.  VASCULAR: Palpable femoral pulses, nonpalpable pedal pulses PULMONARY: Nonlabored respirations ABDOMEN: Soft and non-tender with normal pitched bowel sounds.  MUSCULOSKELETAL: There are no major deformities or cyanosis. NEUROLOGIC: No focal weakness or paresthesias are detected. SKIN: See photo below  PSYCHIATRIC: The patient has a normal affect.   STUDIES:   I have reviewed the following vascular lab studies: +-------+-----------+-----------+------------+------------+  ABI/TBIToday's ABIToday's TBIPrevious ABIPrevious TBI  +-------+-----------+-----------+------------+------------+  Right  0.9        0.75       1.09        0.65          +-------+-----------+-----------+------------+------------+  Left   0.64       0.35       0.88        0.44          +-------+-----------+-----------+------------+------------+  Right toe pressure is 100 Left toe pressure is 46 ASSESSMENT and PLAN   Left heel ulcer in setting of diabetes: I discussed with the patient that this is a limb threatening situation.  We discussed proceeding with angiography to evaluate his blood flow and intervene as indicated.  This will be from a right femoral approach.  We discussed the risk of bleeding and distal embolization.  All questions were answered.  I am scheduling this for  tomorrow Tuesday, August 30   Leia Alf, MD, [facility] [phone] Pager [phone]

## 2020-08-30 ENCOUNTER — Encounter (HOSPITAL_COMMUNITY)
Admission: RE | Disposition: A | Discharge status: Home / Self Care | Payer: Self-pay | Source: Home / Self Care | Attending: Surgery

## 2020-08-30 ENCOUNTER — Other Ambulatory Visit: Payer: Self-pay

## 2020-08-30 ENCOUNTER — Encounter (HOSPITAL_COMMUNITY): Payer: Self-pay | Admitting: Surgery

## 2020-08-30 ENCOUNTER — Ambulatory Visit (HOSPITAL_COMMUNITY)
Admission: RE | Admit: 2020-08-30 | Discharge: 2020-08-30 | Disposition: A | Discharge status: Home / Self Care | Payer: HMO | Attending: Surgery | Admitting: Surgery

## 2020-08-30 DIAGNOSIS — E11621 Type 2 diabetes mellitus with foot ulcer: Secondary | ICD-10-CM | POA: Diagnosis not present

## 2020-08-30 DIAGNOSIS — Z79899 Other long term (current) drug therapy: Secondary | ICD-10-CM | POA: Diagnosis not present

## 2020-08-30 DIAGNOSIS — E7800 Pure hypercholesterolemia, unspecified: Secondary | ICD-10-CM | POA: Diagnosis not present

## 2020-08-30 DIAGNOSIS — F17290 Nicotine dependence, other tobacco product, uncomplicated: Secondary | ICD-10-CM | POA: Diagnosis not present

## 2020-08-30 DIAGNOSIS — Z794 Long term (current) use of insulin: Secondary | ICD-10-CM | POA: Diagnosis not present

## 2020-08-30 DIAGNOSIS — I10 Essential (primary) hypertension: Secondary | ICD-10-CM | POA: Insufficient documentation

## 2020-08-30 DIAGNOSIS — I70244 Atherosclerosis of native arteries of left leg with ulceration of heel and midfoot: Secondary | ICD-10-CM | POA: Diagnosis not present

## 2020-08-30 DIAGNOSIS — E1151 Type 2 diabetes mellitus with diabetic peripheral angiopathy without gangrene: Secondary | ICD-10-CM | POA: Insufficient documentation

## 2020-08-30 DIAGNOSIS — I2510 Atherosclerotic heart disease of native coronary artery without angina pectoris: Secondary | ICD-10-CM | POA: Diagnosis not present

## 2020-08-30 DIAGNOSIS — Z951 Presence of aortocoronary bypass graft: Secondary | ICD-10-CM | POA: Diagnosis not present

## 2020-08-30 DIAGNOSIS — L97429 Non-pressure chronic ulcer of left heel and midfoot with unspecified severity: Secondary | ICD-10-CM | POA: Insufficient documentation

## 2020-08-30 DIAGNOSIS — Z7982 Long term (current) use of aspirin: Secondary | ICD-10-CM | POA: Insufficient documentation

## 2020-08-30 HISTORY — PX: ABDOMINAL AORTOGRAM W/LOWER EXTREMITY: CATH118223

## 2020-08-30 HISTORY — PX: PERIPHERAL VASCULAR BALLOON ANGIOPLASTY: CATH118281

## 2020-08-30 LAB — POCT I-STAT, CHEM 8
BUN: 19 mg/dL (ref 8–23)
Calcium, Ion: 1.21 mmol/L (ref 1.15–1.40)
Chloride: 94 mmol/L — ABNORMAL LOW (ref 98–111)
Creatinine, Ser: 0.5 mg/dL — ABNORMAL LOW (ref 0.61–1.24)
Glucose, Bld: 437 mg/dL — ABNORMAL HIGH (ref 70–99)
HCT: 39 % (ref 39.0–52.0)
Hemoglobin: 13.3 g/dL (ref 13.0–17.0)
Potassium: 4.4 mmol/L (ref 3.5–5.1)
Sodium: 133 mmol/L — ABNORMAL LOW (ref 135–145)
TCO2: 30 mmol/L (ref 22–32)

## 2020-08-30 LAB — POCT ACTIVATED CLOTTING TIME: Activated Clotting Time: 237 seconds

## 2020-08-30 SURGERY — ABDOMINAL AORTOGRAM W/LOWER EXTREMITY
Anesthesia: LOCAL

## 2020-08-30 MED ORDER — HYDRALAZINE HCL 20 MG/ML IJ SOLN
5.0000 mg | INTRAMUSCULAR | Status: DC | PRN
Start: 2020-08-30 — End: 2020-08-30

## 2020-08-30 MED ORDER — FENTANYL CITRATE (PF) 100 MCG/2ML IJ SOLN
INTRAMUSCULAR | Status: AC
  Filled 2020-08-30: qty 2

## 2020-08-30 MED ORDER — HEPARIN SODIUM (PORCINE) 1000 UNIT/ML IJ SOLN
INTRAMUSCULAR | Status: AC
  Filled 2020-08-30: qty 1

## 2020-08-30 MED ORDER — ASPIRIN EC 81 MG PO TBEC: 81.0000 mg | DELAYED_RELEASE_TABLET | Freq: Every day | ORAL | Status: DC

## 2020-08-30 MED ORDER — MIDAZOLAM HCL 2 MG/2ML IJ SOLN
INTRAMUSCULAR | Status: AC
  Filled 2020-08-30: qty 2

## 2020-08-30 MED ORDER — LIDOCAINE HCL (PF) 1 % IJ SOLN
INTRAMUSCULAR | Status: DC | PRN
  Administered 2020-08-30: 15 mL via INTRADERMAL

## 2020-08-30 MED ORDER — SODIUM CHLORIDE 0.9 % IV SOLN: INTRAVENOUS | Status: DC

## 2020-08-30 MED ORDER — MIDAZOLAM HCL 2 MG/2ML IJ SOLN
INTRAMUSCULAR | Status: DC | PRN
  Administered 2020-08-30: 2 mg via INTRAVENOUS
  Administered 2020-08-30 (×2): 1 mg via INTRAVENOUS

## 2020-08-30 MED ORDER — ONDANSETRON HCL 4 MG/2ML IJ SOLN: 4.0000 mg | Freq: Four times a day (QID) | INTRAMUSCULAR | Status: DC | PRN

## 2020-08-30 MED ORDER — SULFAMETHOXAZOLE-TRIMETHOPRIM 800-160 MG PO TABS: 1.0000 | ORAL_TABLET | Freq: Two times a day (BID) | ORAL | 0 refills | Status: DC

## 2020-08-30 MED ORDER — CLOPIDOGREL BISULFATE 75 MG PO TABS: 75.0000 mg | ORAL_TABLET | Freq: Every day | ORAL | 11 refills | Status: DC

## 2020-08-30 MED ORDER — ACETAMINOPHEN 325 MG PO TABS: 650.0000 mg | ORAL_TABLET | ORAL | Status: DC | PRN

## 2020-08-30 MED ORDER — SODIUM CHLORIDE 0.9 % WEIGHT BASED INFUSION: 1.0000 mL/kg/h | INTRAVENOUS | Status: DC

## 2020-08-30 MED ORDER — FENTANYL CITRATE (PF) 100 MCG/2ML IJ SOLN
INTRAMUSCULAR | Status: DC | PRN
  Administered 2020-08-30: 50 ug via INTRAVENOUS
  Administered 2020-08-30 (×2): 25 ug via INTRAVENOUS

## 2020-08-30 MED ORDER — HEPARIN (PORCINE) IN NACL 1000-0.9 UT/500ML-% IV SOLN
INTRAVENOUS | Status: DC | PRN
  Administered 2020-08-30 (×2): 500 mL

## 2020-08-30 MED ORDER — MORPHINE SULFATE (PF) 2 MG/ML IV SOLN
2.0000 mg | INTRAVENOUS | Status: DC | PRN
Start: 2020-08-30 — End: 2020-08-30

## 2020-08-30 MED ORDER — OXYCODONE HCL 5 MG PO TABS: 5.0000 mg | ORAL_TABLET | ORAL | Status: DC | PRN

## 2020-08-30 MED ORDER — IODIXANOL 320 MG/ML IV SOLN
INTRAVENOUS | Status: DC | PRN
  Administered 2020-08-30: 130 mL via INTRA_ARTERIAL

## 2020-08-30 MED ORDER — HEPARIN SODIUM (PORCINE) 1000 UNIT/ML IJ SOLN
INTRAMUSCULAR | Status: DC | PRN
  Administered 2020-08-30: 7000 [IU] via INTRAVENOUS

## 2020-08-30 MED ORDER — SODIUM CHLORIDE 0.9% FLUSH: 3.0000 mL | INTRAVENOUS | Status: DC | PRN

## 2020-08-30 MED ORDER — LABETALOL HCL 5 MG/ML IV SOLN
10.0000 mg | INTRAVENOUS | Status: DC | PRN
Start: 2020-08-30 — End: 2020-08-30

## 2020-08-30 MED ORDER — INSULIN ASPART 100 UNIT/ML IJ SOLN
10.0000 [IU] | Freq: Once | INTRAMUSCULAR | Status: AC
  Administered 2020-08-30: 10 [IU] via SUBCUTANEOUS
  Filled 2020-08-30: qty 1

## 2020-08-30 MED ORDER — SODIUM CHLORIDE 0.9 % IV SOLN: 250.0000 mL | INTRAVENOUS | Status: DC | PRN

## 2020-08-30 MED ORDER — LIDOCAINE HCL (PF) 1 % IJ SOLN
INTRAMUSCULAR | Status: AC
  Filled 2020-08-30: qty 30

## 2020-08-30 MED ORDER — SODIUM CHLORIDE 0.9% FLUSH: 3.0000 mL | Freq: Two times a day (BID) | INTRAVENOUS | Status: DC

## 2020-08-30 MED ORDER — HEPARIN (PORCINE) IN NACL 1000-0.9 UT/500ML-% IV SOLN
INTRAVENOUS | Status: AC
  Filled 2020-08-30: qty 1000

## 2020-08-30 MED ORDER — CLOPIDOGREL BISULFATE 75 MG PO TABS: 75.0000 mg | ORAL_TABLET | Freq: Every day | ORAL | Status: DC

## 2020-08-30 SURGICAL SUPPLY — 17 items
BALLN STERLING OTW 3X100X150 (BALLOONS) ×3
BALLOON STERLING OTW 3X100X150 (BALLOONS) IMPLANT
CATH OMNI FLUSH 5F 65CM (CATHETERS) ×1 IMPLANT
DEVICE VASC CLSR CELT ART 6 (Vascular Products) ×1 IMPLANT
DRAPE C-ARM 35X43 STRL (DRAPES) ×1 IMPLANT
KIT ENCORE 26 ADVANTAGE (KITS) ×1 IMPLANT
KIT MICROPUNCTURE NIT STIFF (SHEATH) ×1 IMPLANT
KIT PV (KITS) ×3 IMPLANT
SHEATH PINNACLE 5F 10CM (SHEATH) ×1 IMPLANT
SHEATH PINNACLE 6F 10CM (SHEATH) ×1 IMPLANT
SHEATH PINNACLE ST 6F 65CM (SHEATH) ×1 IMPLANT
SHEATH PROBE COVER 6X72 (BAG) ×1 IMPLANT
SYR MEDRAD MARK V 150ML (SYRINGE) ×1 IMPLANT
TRANSDUCER W/STOPCOCK (MISCELLANEOUS) ×3 IMPLANT
TRAY PV CATH (CUSTOM PROCEDURE TRAY) ×3 IMPLANT
WIRE G V18X300CM (WIRE) ×1 IMPLANT
WIRE STARTER BENTSON 035X150 (WIRE) ×1 IMPLANT

## 2020-08-30 NOTE — Interval H&P Note (Signed)
History and Physical Interval Note:  08/30/2020 8:02 AM  [name] [name]  has presented today for surgery, with the diagnosis of pvd.  The various methods of treatment have been discussed with the patient and family. After consideration of risks, benefits and other options for treatment, the patient has consented to  Procedure(s): ABDOMINAL AORTOGRAM W/LOWER EXTREMITY (N/A) as a surgical intervention.  The patient's history has been reviewed, patient examined, no change in status, stable for surgery.  I have reviewed the patient's chart and labs.  Questions were answered to the patient's satisfaction.     Annamarie Major

## 2020-08-30 NOTE — Progress Notes (Signed)
Patient and family was given discharge instructions. Both verbalized understanding.

## 2020-08-30 NOTE — Op Note (Signed)
Patient name: [name] [name] MRN: [id number] DOB: 07/09/1955 Sex: male  08/30/2020 Pre-operative Diagnosis: Heel ulcer Post-operative diagnosis:  Same Surgeon:  Annamarie Major Procedure Performed:  1.  Ultrasound-guided access, right femoral artery  2.  Abdominal aortogram  3.  Left lower extremity runoff  4.  Angioplasty, left peroneal artery  5.  Failed attempted angioplasty left anterior tibial artery  6.  Closure device, Celt  7.  Conscious sedation, 70 minutes   Indications: This is a 65 year old diabetic gentleman who presented to the office yesterday with a left heel ulcer.  Vascular lab studies showed an adequate blood flow to the left leg.  He comes in today for arteriogram.  Procedure:  The patient was identified in the holding area and taken to room 8.  The patient was then placed supine on the table and prepped and draped in the usual sterile fashion.  A time out was called.  Conscious sedation was administered with the use of IV fentanyl and Versed under continuous physician and nurse monitoring.  Heart rate, blood pressure, and oxygen saturation were continuously monitored.  Total sedation time was 70 minutes.  Ultrasound was used to evaluate the right common femoral artery.  It was patent .  A digital ultrasound image was acquired.  A micropuncture needle was used to access the right common femoral artery under ultrasound guidance.  An 018 wire was advanced without resistance and a micropuncture sheath was placed.  The 018 wire was removed and a benson wire was placed.  The micropuncture sheath was exchanged for a 5 french sheath.  An omniflush catheter was advanced over the wire to the level of L-1.  An abdominal angiogram was obtained.  Next, using the omniflush catheter and a benson wire, the aortic bifurcation was crossed and the catheter was placed into theleft external iliac artery and left runoff was obtained.    Findings:   Aortogram: No significant renal artery  stenosis was identified.  The infrarenal abdominal aorta is widely patent.  Ectasia is noted in bilateral common iliac arteries.  There is no significant iliac stenosis.  Right Lower Extremity: Not evaluated secondary to contrast utilization for intervention  Left Lower Extremity: Left common femoral and profundofemoral artery are widely patent.  The superficial femoral and popliteal artery are widely patent.  There is diffuse three-vessel tibial disease.  The posterior tibial artery is occluded.  The anterior tibial artery is occluded.  The peroneal artery is the dominant runoff however there are multiple areas of greater than 90% stenosis.  The peroneal reconstitutes the posterior tibial artery and the anterior tibial artery creating the plantar arch.  Intervention: After the above images were acquired the decision was made to proceed with intervention.  Over a 035 wire, a 6 French 65 cm sheath was inserted into the left superficial femoral artery.  The patient was fully heparinized.  Heparin levels were monitored with ACT measurements.  I used a V-18 wire with the support of a 3 x 100 Sterling balloon and navigated the wire and balloon into the distal peroneal artery past the lesion.  I performed 2 separate inflations of a 3 x 100 balloon at 8 atm for 2 minutes.  Completion imaging revealed resolution of the stenosis within the peroneal artery.  Next attention was turned towards the anterior tibial artery.  This was selected with the V 18 wire and the balloon was advanced over the wire into the anterior tibial artery.  I attempted subintimal recanalization, however  could not identify the appropriate path to get back into the artery.  Ultimately this was aborted.  Catheters and wires were removed.  The sheath was exchanged out for a short 6 French sheath and the Celt device was used for closure.  There were no immediate complications.  Impression:  #1  Ectatic bilateral iliac arteries which need to be  better evaluated with ultrasound.  #2  Multiple greater than 80% peroneal artery stenoses which were successfully treated with 3 mm balloon with no residual stenosis.  The peroneal artery is the dominant runoff.  There is reconstitution of the posterior tibial and anterior tibial at the ankle.  #3  Attempt was made to recanalize anterior tibial artery however this was unsuccessful.  If the patient has additional needs for revascularization, pedal access could be considered,    V. Annamarie Major, M.D., [facility] [place] Office: [phone] Pager:  [phone]

## 2020-08-31 ENCOUNTER — Other Ambulatory Visit: Payer: Self-pay | Admitting: Internal Medicine

## 2020-08-31 DIAGNOSIS — E785 Hyperlipidemia, unspecified: Secondary | ICD-10-CM

## 2020-08-31 DIAGNOSIS — I480 Paroxysmal atrial fibrillation: Secondary | ICD-10-CM

## 2020-08-31 DIAGNOSIS — F3341 Major depressive disorder, recurrent, in partial remission: Secondary | ICD-10-CM

## 2020-08-31 MED ORDER — MIRTAZAPINE 15 MG PO TABS: 15.0000 mg | ORAL_TABLET | Freq: Every day | ORAL | 1 refills | Status: DC

## 2020-08-31 MED ORDER — ATORVASTATIN CALCIUM 40 MG PO TABS: 40.0000 mg | ORAL_TABLET | Freq: Every day | ORAL | 1 refills | Status: DC

## 2020-08-31 MED ORDER — PAROXETINE HCL 20 MG PO TABS: 20.0000 mg | ORAL_TABLET | Freq: Every day | ORAL | 1 refills | Status: DC

## 2020-09-01 ENCOUNTER — Telehealth: Payer: Self-pay | Admitting: Internal Medicine

## 2020-09-01 ENCOUNTER — Other Ambulatory Visit: Payer: Self-pay

## 2020-09-01 ENCOUNTER — Encounter (HOSPITAL_BASED_OUTPATIENT_CLINIC_OR_DEPARTMENT_OTHER): Payer: HMO | Attending: Internal Medicine | Admitting: Internal Medicine

## 2020-09-01 DIAGNOSIS — D649 Anemia, unspecified: Secondary | ICD-10-CM | POA: Diagnosis not present

## 2020-09-01 DIAGNOSIS — L97528 Non-pressure chronic ulcer of other part of left foot with other specified severity: Secondary | ICD-10-CM | POA: Diagnosis not present

## 2020-09-01 DIAGNOSIS — L97512 Non-pressure chronic ulcer of other part of right foot with fat layer exposed: Secondary | ICD-10-CM | POA: Diagnosis not present

## 2020-09-01 DIAGNOSIS — E11621 Type 2 diabetes mellitus with foot ulcer: Secondary | ICD-10-CM | POA: Diagnosis not present

## 2020-09-01 DIAGNOSIS — L89622 Pressure ulcer of left heel, stage 2: Secondary | ICD-10-CM | POA: Insufficient documentation

## 2020-09-01 DIAGNOSIS — I2510 Atherosclerotic heart disease of native coronary artery without angina pectoris: Secondary | ICD-10-CM | POA: Diagnosis not present

## 2020-09-01 DIAGNOSIS — M1990 Unspecified osteoarthritis, unspecified site: Secondary | ICD-10-CM | POA: Insufficient documentation

## 2020-09-01 DIAGNOSIS — L89623 Pressure ulcer of left heel, stage 3: Secondary | ICD-10-CM | POA: Diagnosis not present

## 2020-09-01 DIAGNOSIS — N138 Other obstructive and reflux uropathy: Secondary | ICD-10-CM | POA: Diagnosis not present

## 2020-09-01 DIAGNOSIS — F3341 Major depressive disorder, recurrent, in partial remission: Secondary | ICD-10-CM | POA: Diagnosis not present

## 2020-09-01 DIAGNOSIS — Z7901 Long term (current) use of anticoagulants: Secondary | ICD-10-CM | POA: Insufficient documentation

## 2020-09-01 DIAGNOSIS — M720 Palmar fascial fibromatosis [Dupuytren]: Secondary | ICD-10-CM | POA: Diagnosis not present

## 2020-09-01 DIAGNOSIS — Z7902 Long term (current) use of antithrombotics/antiplatelets: Secondary | ICD-10-CM | POA: Insufficient documentation

## 2020-09-01 DIAGNOSIS — L97511 Non-pressure chronic ulcer of other part of right foot limited to breakdown of skin: Secondary | ICD-10-CM | POA: Diagnosis not present

## 2020-09-01 DIAGNOSIS — E785 Hyperlipidemia, unspecified: Secondary | ICD-10-CM | POA: Insufficient documentation

## 2020-09-01 DIAGNOSIS — I480 Paroxysmal atrial fibrillation: Secondary | ICD-10-CM | POA: Diagnosis not present

## 2020-09-01 DIAGNOSIS — E1142 Type 2 diabetes mellitus with diabetic polyneuropathy: Secondary | ICD-10-CM | POA: Insufficient documentation

## 2020-09-01 DIAGNOSIS — N401 Enlarged prostate with lower urinary tract symptoms: Secondary | ICD-10-CM | POA: Diagnosis not present

## 2020-09-01 DIAGNOSIS — I10 Essential (primary) hypertension: Secondary | ICD-10-CM | POA: Insufficient documentation

## 2020-09-01 DIAGNOSIS — F419 Anxiety disorder, unspecified: Secondary | ICD-10-CM | POA: Diagnosis not present

## 2020-09-01 DIAGNOSIS — L409 Psoriasis, unspecified: Secondary | ICD-10-CM | POA: Diagnosis not present

## 2020-09-01 DIAGNOSIS — Z951 Presence of aortocoronary bypass graft: Secondary | ICD-10-CM | POA: Diagnosis not present

## 2020-09-01 DIAGNOSIS — M47812 Spondylosis without myelopathy or radiculopathy, cervical region: Secondary | ICD-10-CM | POA: Diagnosis not present

## 2020-09-01 DIAGNOSIS — L97423 Non-pressure chronic ulcer of left heel and midfoot with necrosis of muscle: Secondary | ICD-10-CM | POA: Diagnosis not present

## 2020-09-01 DIAGNOSIS — Z794 Long term (current) use of insulin: Secondary | ICD-10-CM | POA: Diagnosis not present

## 2020-09-01 DIAGNOSIS — S42132D Displaced fracture of coracoid process, left shoulder, subsequent encounter for fracture with routine healing: Secondary | ICD-10-CM | POA: Diagnosis not present

## 2020-09-01 DIAGNOSIS — L97523 Non-pressure chronic ulcer of other part of left foot with necrosis of muscle: Secondary | ICD-10-CM | POA: Diagnosis not present

## 2020-09-01 DIAGNOSIS — I4891 Unspecified atrial fibrillation: Secondary | ICD-10-CM | POA: Diagnosis not present

## 2020-09-01 DIAGNOSIS — E1151 Type 2 diabetes mellitus with diabetic peripheral angiopathy without gangrene: Secondary | ICD-10-CM | POA: Insufficient documentation

## 2020-09-01 DIAGNOSIS — L97522 Non-pressure chronic ulcer of other part of left foot with fat layer exposed: Secondary | ICD-10-CM | POA: Diagnosis not present

## 2020-09-01 DIAGNOSIS — Z7982 Long term (current) use of aspirin: Secondary | ICD-10-CM | POA: Diagnosis not present

## 2020-09-01 DIAGNOSIS — G8929 Other chronic pain: Secondary | ICD-10-CM | POA: Diagnosis not present

## 2020-09-01 DIAGNOSIS — K5904 Chronic idiopathic constipation: Secondary | ICD-10-CM | POA: Diagnosis not present

## 2020-09-01 NOTE — Telephone Encounter (Signed)
Pharmacy says all refill request were approved & sent back except the one for apixaban (ELIQUIS) 5 MG TABS tablet   Please send to pharmacy: [facility] [id number] - [place] [address], [place]  Phone:  [phone] Fax:  [phone]

## 2020-09-01 NOTE — Progress Notes (Signed)
[name] [name] ([id number]) Visit Report for 09/01/2020 Debridement Details Patient Name: Date of Service: [name] [name] [facility] TH[facility] N L. 09/01/2020 8:00 A M Medical Record Number: [id number] Patient Account Number: [id number] Date of Birth/Sex: Treating RN: [birth year] (65 y.o. [name] [name] Primary Care Provider: [name] Other Clinician: Referring Provider: Treating Provider/Extender: [name] in Treatment: 8 Debridement Performed for Assessment: Wound #1 Left Calcaneus Performed By: Physician Ricard Dillon., MD Debridement Type: Debridement Level of Consciousness (Pre-procedure): Awake and Alert Pre-procedure Verification/Time Out Yes - 09:11 Taken: Start Time: 09:11 T Area Debrided (L x W): otal 2 (cm) x 2 (cm) = 4 (cm) Tissue and other material debrided: Viable, Non-Viable, Slough, Subcutaneous, Biofilm, Slough Level: Skin/Subcutaneous Tissue Debridement Description: Excisional Instrument: Curette Bleeding: Moderate Hemostasis Achieved: Silver Nitrate End Time: 09:13 Procedural Pain: 3 Post Procedural Pain: 1 Response to Treatment: Procedure was tolerated well Level of Consciousness (Post- Awake and Alert procedure): Post Debridement Measurements of Total Wound Length: (cm) 4 Stage: Category/Stage III Width: (cm) 2.6 Depth: (cm) 0.1 Volume: (cm) 0.817 Character of Wound/Ulcer Post Debridement: Requires Further Debridement Post Procedure Diagnosis Same as Pre-procedure Electronic Signature(s) Signed: 09/01/2020 5:31:22 PM By: Linton Ham MD Signed: 09/01/2020 5:36:38 PM By: [name] Entered By: Linton Ham on 09/01/2020 09:32:48 -------------------------------------------------------------------------------- Debridement Details Patient Name: Date of Service: [name] [name] [facility] TH[facility] N L. 09/01/2020 8:00 A M Medical Record Number: [id number] Patient Account Number: [id number] Date of Birth/Sex: Treating RN: 1955/10/13 (65 y.o. [name], [name] Primary Care Provider: [name] Other Clinician: Referring Provider: Treating Provider/Extender: [name] in Treatment: 8 Debridement Performed for Assessment: Wound #2 Left,Lateral Foot Performed By: Physician Ricard Dillon., MD Debridement Type: Debridement Severity of Tissue Pre Debridement: Fat layer exposed Level of Consciousness (Pre-procedure): Awake and Alert Pre-procedure Verification/Time Out Yes - 09:11 Taken: Start Time: 09:11 T Area Debrided (L x W): otal 0.7 (cm) x 0.6 (cm) = 0.42 (cm) Tissue and other material debrided: Viable, Non-Viable, Slough, Subcutaneous, Biofilm, Slough Level: Skin/Subcutaneous Tissue Debridement Description: Excisional Instrument: Curette Bleeding: Minimum Hemostasis Achieved: Pressure End Time: 09:13 Procedural Pain: 3 Post Procedural Pain: 1 Response to Treatment: Procedure was tolerated well Level of Consciousness (Post- Awake and Alert procedure): Post Debridement Measurements of Total Wound Length: (cm) 0.7 Width: (cm) 0.6 Depth: (cm) 0.1 Volume: (cm) 0.033 Character of Wound/Ulcer Post Debridement: Requires Further Debridement Severity of Tissue Post Debridement: Fat layer exposed Post Procedure Diagnosis Same as Pre-procedure Electronic Signature(s) Signed: 09/01/2020 5:31:22 PM By: Linton Ham MD Signed: 09/01/2020 5:36:38 PM By: [name] Entered By: Linton Ham on 09/01/2020 09:32:58 -------------------------------------------------------------------------------- Debridement Details Patient Name: Date of Service: [name] [name] [facility] N L. 09/01/2020 8:00 A M Medical Record Number: [id number] Patient Account Number: [id number] Date of Birth/Sex: Treating RN: December 16, 1955 (65 y.o. [name] [name] Primary Care Provider: [name] Other Clinician: Referring Provider: Treating Provider/Extender: [name] in Treatment: 8 Debridement  Performed for Assessment: Wound #3 Right,Lateral Foot Performed By: Physician Ricard Dillon., MD Debridement Type: Debridement Severity of Tissue Pre Debridement: Fat layer exposed Level of Consciousness (Pre-procedure): Awake and Alert Pre-procedure Verification/Time Out Yes - 09:11 Taken: Start Time: 09:11 T Area Debrided (L x W): otal 0.8 (cm) x 1 (cm) = 0.8 (cm) Tissue and other material debrided: Viable, Non-Viable, Slough, Subcutaneous, Biofilm, Slough Level: Skin/Subcutaneous Tissue Debridement Description: Excisional Instrument: Curette Bleeding: Moderate Hemostasis Achieved: Silver Nitrate End Time: 09:13 Procedural Pain: 3 Post  Procedural Pain: 1 Response to Treatment: Procedure was tolerated well Level of Consciousness (Post- Awake and Alert procedure): Post Debridement Measurements of Total Wound Length: (cm) 0.8 Width: (cm) 1 Depth: (cm) 0.1 Volume: (cm) 0.063 Character of Wound/Ulcer Post Debridement: Requires Further Debridement Severity of Tissue Post Debridement: Fat layer exposed Post Procedure Diagnosis Same as Pre-procedure Electronic Signature(s) Signed: 09/01/2020 5:31:22 PM By: Linton Ham MD Signed: 09/01/2020 5:36:38 PM By: [name] Entered By: Linton Ham on 09/01/2020 09:33:08 -------------------------------------------------------------------------------- HPI Details Patient Name: Date of Service: [name] [name] [facility] L. 09/01/2020 8:00 A M Medical Record Number: [id number] Patient Account Number: [id number] Date of Birth/Sex: Treating RN: 1955-07-27 (65 y.o. [name] [name] Primary Care Provider: [name] Other Clinician: Referring Provider: Treating Provider/Extender: [name] in Treatment: 8 History of Present Illness HPI Description: ADMISSION 07/07/2020 This is a 65 year old man who is a type II diabetic. I have reviewed this in epic. It appeared that his diabetes was diagnosed in 2009 at  which time he was on metformin. He was followed for a period of time by an endocrinologist with [facility] to always labeled him as a type II diabetic. Recently in epic there is been a switch to type 1 diabetes. If there were defining antibody test done I do not see them and I think the totality of the evidence would suggest that he is a type II diabetic. His recent problem started in May when he fell down 17 stairs. He suffered a scapular fracture he was hospitalized at [facility] for 9 days and then discharged to [facility]. His mother who is present said that most of these wounds seem to develop surrounding the discharge from hospital or the stay at [facility]. He has an area on his lower sacrum which appears currently to be a stage II but per their description this is actually improved quite a bit. He has an area on the left lateral foot at roughly the base of the fifth metatarsal, the left Achilles heel and the right fifth metatarsal head laterally they have been using topical antibiotics. Seen by primary care yesterday and placed on Keflex The patient had arterial studies on 05/05/2020. I wondered whether this had something to do with wounds on his feet at that time but I have not been able to determine this. At that time his ABI on the right was 1.09 with a TBI of 0.65 on the left 0.88 with a TBI of 0.44. Waveforms were triphasic on the right monophasic on the left. He likely has significant PAD on the left Past medical history includes type 2 diabetes poorly controlled last hemoglobin A1c at 12.7 in May, hypertension, hyperlipidemia, atrial fib on Eliquis, coronary artery disease status post CABG, left scapular fracture in May of this year, AAA repair. Bilateral leg weakness ABIs were not done in our clinic as they were just done in [facility] this [facility]; patient arrives back in clinic he has areas on the left Achilles heel, base of the left fifth metatarsal, right lateral fifth  metatarsal. The area he had over the coccyx appears to be healed but this area remains vulnerable. We have been using Iodoflex. I rereviewed his ABIs which were done in May. I do not know exactly what prompted these test however they do show a TBI of 0.44 on the left and monophasic waveforms. 7/21; the patient's wounds are about the same. Certainly no healing and the nurses noted some  green drainage on the right fifth metatarsal head, left Achilles a little larger. We have been using silver alginate really without any improvement. We did get an appointment with vascular surgery however its that it did the end of August and they seem to want to repeat the noninvasive test that they have already done in May. I am not sure what the issue here is. We advised the patient's sister to call and point to the idea that they have already had noninvasive studies. The real question I have is does this patient need an angiogram. He is a diabetic and it is possible that the ABIs are falsely elevated because of medial calcification. At least on the left his TBI was abnormal 08/08/2020 upon evaluation today patient appears to be doing about the same in regard to his wounds. He still waiting the appointment with vascular. This is something that hopefully should be undertaken shortly. The 29th of this month is when he is actually scheduled although they have put in a call to move this up if at all possible. Fortunately there does not appear to be any signs of active infection at this time. No fevers, chills, nausea, vomiting, or diarrhea. 8/18; appointment with Dr. Trula Slade on 8/29. The patient has wounds on the left Achilles heel, left lateral foot and the right lateral fifth metatarsal head. We have been using Santyl under Hydrofera Blue. The surface of especially the left heel looks better to me than what I remember. 9/1; since the patient was last here he underwent angiography by Dr. Trula Slade he also underwent  noninvasive studies first showing a ABI of of 0.64 with monophasic waveforms. His TBI was 0.35 with a left great toe pressure of 46 this is about the same pattern as we saw previously. The left was a lot worse than the right. He underwent an angiogram on 08/29/2020. He had multiple 80% peroneal artery stenosis which was his dominant vessel which was treated with angioplasty with no residual stenosis. 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 The anterior tibial artery was also occluded. An attempt was made at recanalization of the anterior tibial artery however this was unsuccessful. Noted that the peroneal artery reconstitutes the posterior tibial artery and the anterior tibial artery creating the plantar arch. The patient has areas on the Achilles left heel left lateral foot and right lateral fifth metatarsal head we have been using Santyl and Hydrofera Blue Electronic Signature(s) Signed: 09/01/2020 5:31:22 PM By: Linton Ham MD Entered By: Linton Ham on 09/01/2020 09:37:29 -------------------------------------------------------------------------------- Physical Exam Details Patient Name: Date of Service: [name] [name] [facility] THA N L. 09/01/2020 8:00 A M Medical Record Number: [id number] Patient Account Number: [id number] Date of Birth/Sex: Treating RN: 1955-05-02 (65 y.o. [name] [name] Primary Care Provider: [name] Other Clinician: Referring Provider: Treating Provider/Extender: [name] in Treatment: 8 Constitutional Sitting or standing Blood Pressure is within target range for patient.. Pulse regular and within target range for patient.Marland Kitchen Respirations regular, non-labored and within target range.. Temperature is normal and within the target range for the patient.Marland Kitchen Appears in no distress. Notes Wound exam; I debrided about 50% of the area of the left Achilles heel with type black adherent eschar and subcutaneous tissue. Hemostasis with  silver nitrate and a pressure dressing. Also the left lateral foot and the right fifth metatarsal head although these debridements were not as significant. Hemostasis was with direct pressure. Electronic Signature(s) Signed: 09/01/2020 5:31:22 PM By: Linton Ham MD Entered By: Dellia Nims,  Legrand Como on 09/01/2020 09:38:22 -------------------------------------------------------------------------------- Physician Orders Details Patient Name: Date of Service: [name] [name] [facility] THA N L. 09/01/2020 8:00 A M Medical Record Number: [id number] Patient Account Number: [id number] Date of Birth/Sex: Treating RN: October 27, 1955 (65 y.o. [name] Primary Care Provider: [name] Other Clinician: Referring Provider: Treating Provider/Extender: [name] in Treatment: 8 Verbal / Phone Orders: No Diagnosis Coding ICD-10 Coding Code Description E11.621 Type 2 diabetes mellitus with foot ulcer L97.528 Non-pressure chronic ulcer of other part of left foot with other specified severity L97.511 Non-pressure chronic ulcer of other part of right foot limited to breakdown of skin L89.622 Pressure ulcer of left heel, stage 2 E11.42 Type 2 diabetes mellitus with diabetic polyneuropathy Follow-up Appointments Return appointment in 3 weeks. - with Dr. Dellia Nims Bathing/ Shower/ Hygiene May shower and wash wound with soap and water. - with dressing changes only. Off-Loading Turn and reposition every 2 hours Other: - float heels while resting in bed or chair. Use a pillow or rolled sheet under legs to offload the heels. Home Health No change in wound care orders this week; continue Home Health for wound care. May utilize formulary equivalent dressing for wound treatment orders unless otherwise specified. Other Home Health Orders/Instructions: - [facility] home health twice a week and family once a week. Wound Treatment Wound #1 - Calcaneus Wound Laterality: Left Cleanser: Soap and Water  (Home Health) 3 x Per Week/30 Days Discharge Instructions: May shower and wash wound with dial antibacterial soap and water prior to dressing change. Cleanser: Wound Cleanser (Home Health) 3 x Per Week/30 Days Discharge Instructions: Cleanse the wound with wound cleanser prior to applying a clean dressing using gauze sponges, not tissue or cotton balls. Prim Dressing: Hydrofera Blue Classic Foam, 4x4 in (Home Health) 3 x Per Week/30 Days ary Discharge Instructions: Moisten with saline prior to applying to wound bed Prim Dressing: Santyl Ointment ([place]) 3 x Per Week/30 Days ary Discharge Instructions: Apply nickel thick amount to wound bed as instructed Secondary Dressing: Woven Gauze Sponge, Non-Sterile 4x4 in (Home Health) 3 x Per Week/30 Days Discharge Instructions: Apply over primary dressing as directed. Secondary Dressing: ALLEVYN Heel 4 1/2in x 5 1/2in / 10.5cm x 13.5cm (Home Health) 3 x Per Week/30 Days Discharge Instructions: Apply over primary dressing as directed. Secured With: [employer], 4.5x3.1 (in/yd) (Home Health) 3 x Per Week/30 Days Discharge Instructions: Secure with Kerlix as directed. Secured With: 30M Medipore H Soft Cloth Surgical T 4 x 2 (in/yd) (Home Health) 3 x Per Week/30 Days ape Discharge Instructions: Secure dressing with tape as directed. Wound #2 - Foot Wound Laterality: Left, Lateral Cleanser: Soap and Water (Home Health) 3 x Per Week/30 Days Discharge Instructions: May shower and wash wound with dial antibacterial soap and water prior to dressing change. Cleanser: Wound Cleanser (Home Health) 3 x Per Week/30 Days Discharge Instructions: Cleanse the wound with wound cleanser prior to applying a clean dressing using gauze sponges, not tissue or cotton balls. Prim Dressing: Hydrofera Blue Classic Foam, 4x4 in (Home Health) 3 x Per Week/30 Days ary Discharge Instructions: Moisten with saline prior to applying to wound bed Prim Dressing: Santyl  Ointment ([place]) 3 x Per Week/30 Days ary Discharge Instructions: Apply nickel thick amount to wound bed as instructed Secondary Dressing: Woven Gauze Sponge, Non-Sterile 4x4 in (Home Health) 3 x Per Week/30 Days Discharge Instructions: Apply over primary dressing as directed. Secured With: [employer], 4.5x3.1 (in/yd) (Home Health) 3  x Per Week/30 Days Discharge Instructions: Secure with Kerlix as directed. Secured With: 42M Medipore H Soft Cloth Surgical T 4 x 2 (in/yd) (Home Health) 3 x Per Week/30 Days ape Discharge Instructions: Secure dressing with tape as directed. Wound #3 - Foot Wound Laterality: Right, Lateral Cleanser: Soap and Water (Home Health) 3 x Per Week/30 Days Discharge Instructions: May shower and wash wound with dial antibacterial soap and water prior to dressing change. Cleanser: Wound Cleanser (Home Health) 3 x Per Week/30 Days Discharge Instructions: Cleanse the wound with wound cleanser prior to applying a clean dressing using gauze sponges, not tissue or cotton balls. Prim Dressing: Hydrofera Blue Classic Foam, 4x4 in (Home Health) 3 x Per Week/30 Days ary Discharge Instructions: Moisten with saline prior to applying to wound bed Prim Dressing: Santyl Ointment ([place]) 3 x Per Week/30 Days ary Discharge Instructions: Apply nickel thick amount to wound bed as instructed Secondary Dressing: Woven Gauze Sponge, Non-Sterile 4x4 in (Home Health) 3 x Per Week/30 Days Discharge Instructions: Apply over primary dressing as directed. Secured With: [employer], 4.5x3.1 (in/yd) (Home Health) 3 x Per Week/30 Days Discharge Instructions: Secure with Kerlix as directed. Secured With: 42M Medipore H Soft Cloth Surgical T 4 x 2 (in/yd) (Home Health) 3 x Per Week/30 Days ape Discharge Instructions: Secure dressing with tape as directed. Electronic Signature(s) Signed: 09/01/2020 5:19:32 PM By: Levan Hurst RN, BSN Signed: 09/01/2020 5:31:22 PM By: Linton Ham MD Entered By: Levan Hurst on 09/01/2020 09:30:28 -------------------------------------------------------------------------------- Problem List Details Patient Name: Date of Service: [name] [name] [facility] THA N L. 09/01/2020 8:00 A M Medical Record Number: [id number] Patient Account Number: [id number] Date of Birth/Sex: Treating RN: 1955/03/27 (65 y.o. [name], [name] Primary Care Provider: [name] Other Clinician: Referring Provider: Treating Provider/Extender: [name] in Treatment: 8 Active Problems ICD-10 Encounter Code Description Active Date MDM Diagnosis E11.621 Type 2 diabetes mellitus with foot ulcer 07/07/2020 No Yes L97.528 Non-pressure chronic ulcer of other part of left foot with other specified 07/07/2020 No Yes severity L97.511 Non-pressure chronic ulcer of other part of right foot limited to breakdown of 07/07/2020 No Yes skin L89.622 Pressure ulcer of left heel, stage 2 07/07/2020 No Yes E11.42 Type 2 diabetes mellitus with diabetic polyneuropathy 07/07/2020 No Yes E11.51 Type 2 diabetes mellitus with diabetic peripheral angiopathy without gangrene 09/01/2020 No Yes Inactive Problems ICD-10 Code Description Active Date Inactive Date L89.152 Pressure ulcer of sacral region, stage 2 07/07/2020 07/07/2020 Resolved Problems Electronic Signature(s) Signed: 09/01/2020 5:31:22 PM By: Linton Ham MD Entered By: Linton Ham on 09/01/2020 09:41:19 -------------------------------------------------------------------------------- Progress Note Details Patient Name: Date of Service: [name] [name] [facility] THA N L. 09/01/2020 8:00 A M Medical Record Number: [id number] Patient Account Number: [id number] Date of Birth/Sex: Treating RN: [birth year] (65 y.o. [name] [name] Primary Care Provider: [name] Other Clinician: Referring Provider: Treating Provider/Extender: [name] in Treatment: 8 Subjective History of Present  Illness (HPI) ADMISSION 07/07/2020 This is a 65 year old man who is a type II diabetic. I have reviewed this in epic. It appeared that his diabetes was diagnosed in 2009 at which time he was on metformin. He was followed for a period of time by an endocrinologist with [facility] to always labeled him as a type II diabetic. Recently in epic there is been a switch to type 1 diabetes. If there were defining antibody test done I do not see them and I think the totality of the evidence  would suggest that he is a type II diabetic. His recent problem started in May when he fell down 17 stairs. He suffered a scapular fracture he was hospitalized at [facility] for 9 days and then discharged to [facility]. His mother who is present said that most of these wounds seem to develop surrounding the discharge from hospital or the stay at [facility]. He has an area on his lower sacrum which appears currently to be a stage II but per their description this is actually improved quite a bit. He has an area on the left lateral foot at roughly the base of the fifth metatarsal, the left Achilles heel and the right fifth metatarsal head laterally they have been using topical antibiotics. Seen by primary care yesterday and placed on Keflex The patient had arterial studies on 05/05/2020. I wondered whether this had something to do with wounds on his feet at that time but I have not been able to determine this. At that time his ABI on the right was 1.09 with a TBI of 0.65 on the left 0.88 with a TBI of 0.44. Waveforms were triphasic on the right monophasic on the left. He likely has significant PAD on the left Past medical history includes type 2 diabetes poorly controlled last hemoglobin A1c at 12.7 in May, hypertension, hyperlipidemia, atrial fib on Eliquis, coronary artery disease status post CABG, left scapular fracture in May of this year, AAA repair. Bilateral leg weakness ABIs were not done in our clinic as they were just done  in May [facility] this [facility]; patient arrives back in clinic he has areas on the left Achilles heel, base of the left fifth metatarsal, right lateral fifth metatarsal. The area he had over the coccyx appears to be healed but this area remains vulnerable. We have been using Iodoflex. I rereviewed his ABIs which were done in May. I do not know exactly what prompted these test however they do show a TBI of 0.44 on the left and monophasic waveforms. 7/21; the patient's wounds are about the same. Certainly no healing and the nurses noted some green drainage on the right fifth metatarsal head, left Achilles a little larger. We have been using silver alginate really without any improvement. We did get an appointment with vascular surgery however its that it did the end of August and they seem to want to repeat the noninvasive test that they have already done in May. I am not sure what the issue here is. We advised the patient's sister to call and point to the idea that they have already had noninvasive studies. The real question I have is does this patient need an angiogram. He is a diabetic and it is possible that the ABIs are falsely elevated because of medial calcification. At least on the left his TBI was abnormal 08/08/2020 upon evaluation today patient appears to be doing about the same in regard to his wounds. He still waiting the appointment with vascular. This is something that hopefully should be undertaken shortly. The 29th of this month is when he is actually scheduled although they have put in a call to move this up if at all possible. Fortunately there does not appear to be any signs of active infection at this time. No fevers, chills, nausea, vomiting, or diarrhea. 8/18; appointment with Dr. Trula Slade on 8/29. The patient has wounds on the left Achilles heel, left lateral foot and the right lateral fifth metatarsal head. We have been using  Santyl under Hydrofera Blue. The surface  of especially the left heel looks better to me than what I remember. 9/1; since the patient was last here he underwent angiography by Dr. Trula Slade he also underwent noninvasive studies first showing a ABI of of 0.64 with monophasic waveforms. His TBI was 0.35 with a left great toe pressure of 46 this is about the same pattern as we saw previously. The left was a lot worse than the right. He underwent an angiogram on 08/29/2020. He had multiple 80% peroneal artery stenosis which was his dominant vessel which was treated with angioplasty with no residual stenosis. 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 The anterior tibial artery was also occluded. An attempt was made at recanalization of the anterior tibial artery however this was unsuccessful. Noted that the peroneal artery reconstitutes the posterior tibial artery and the anterior tibial artery creating the plantar arch. The patient has areas on the Achilles left heel left lateral foot and right lateral fifth metatarsal head we have been using Santyl and Hydrofera Blue Objective Constitutional Sitting or standing Blood Pressure is within target range for patient.. Pulse regular and within target range for patient.Marland Kitchen Respirations regular, non-labored and within target range.. Temperature is normal and within the target range for the patient.Marland Kitchen Appears in no distress. Vitals Time Taken: 8:14 AM, Height: 74 in, Weight: 151 lbs, BMI: 19.4, Temperature: 98.7 F, Pulse: 78 bpm, Respiratory Rate: 16 breaths/min, Blood Pressure: 107/68 mmHg. General Notes: Wound exam; I debrided about 50% of the area of the left Achilles heel with type black adherent eschar and subcutaneous tissue. Hemostasis with silver nitrate and a pressure dressing. Also the left lateral foot and the right fifth metatarsal head although these debridements were not as significant. Hemostasis was with direct pressure. Integumentary (Hair, Skin) Wound #1 status is Open. Original  cause of wound was Pressure Injury. The date acquired was: 05/23/2020. The wound has been in treatment 8 weeks. The wound is located on the Left Calcaneus. The wound measures 4cm length x 2.6cm width x 0.1cm depth; 8.168cm^2 area and 0.817cm^3 volume. There is Fat Layer (Subcutaneous Tissue) exposed. There is no tunneling or undermining noted. There is a medium amount of serosanguineous drainage noted. The wound margin is flat and intact. There is small (1-33%) pink granulation within the wound bed. There is a large (67-100%) amount of necrotic tissue within the wound bed including Eschar and Adherent Slough. Wound #2 status is Open. Original cause of wound was Gradually Appeared. The date acquired was: 06/21/2020. The wound has been in treatment 8 weeks. The wound is located on the Left,Lateral Foot. The wound measures 0.7cm length x 0.6cm width x 0.1cm depth; 0.33cm^2 area and 0.033cm^3 volume. There is Fat Layer (Subcutaneous Tissue) exposed. There is no tunneling or undermining noted. There is a medium amount of serosanguineous drainage noted. The wound margin is distinct with the outline attached to the wound base. There is large (67-100%) red, friable granulation within the wound bed. There is a small (1-33%) amount of necrotic tissue within the wound bed including Adherent Slough. Wound #3 status is Open. Original cause of wound was Gradually Appeared. The date acquired was: 06/21/2020. The wound has been in treatment 8 weeks. The wound is located on the Right,Lateral Foot. The wound measures 0.8cm length x 1cm width x 0.1cm depth; 0.628cm^2 area and 0.063cm^3 volume. There is Fat Layer (Subcutaneous Tissue) exposed. There is no tunneling or undermining noted. There is a medium amount of serosanguineous drainage noted.  The wound margin is distinct with the outline attached to the wound base. There is medium (34-66%) red, pink, friable granulation within the wound bed. There is a medium (34-66%)  amount of necrotic tissue within the wound bed including Adherent Slough. 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 There was a Excisional Skin/Subcutaneous Tissue Debridement with a total area of 4 sq cm performed by Ricard Dillon., MD. With the following instrument(s): Curette to remove Viable and Non-Viable tissue/material. Material removed includes Subcutaneous Tissue, Slough, and Biofilm. No specimens were taken. A time out was conducted at 09:11, prior to the start of the procedure. A Moderate amount of bleeding was controlled with Silver Nitrate. The procedure was tolerated well with a pain level of 3 throughout and a pain level of 1 following the procedure. Post Debridement Measurements: 4cm length x 2.6cm width x 0.1cm depth; 0.817cm^3 volume. Post debridement Stage noted as Category/Stage III. Character of Wound/Ulcer Post Debridement requires further debridement. Post procedure Diagnosis Wound #1: Same as Pre-Procedure Wound #2 Pre-procedure diagnosis of Wound #2 is a Diabetic Wound/Ulcer of the Lower Extremity located on the Left,Lateral Foot .Severity of Tissue Pre Debridement is: Fat layer exposed. There was a Excisional Skin/Subcutaneous Tissue Debridement with a total area of 0.42 sq cm performed by Ricard Dillon., MD. With the following instrument(s): Curette to remove Viable and Non-Viable tissue/material. Material removed includes Subcutaneous Tissue, Slough, and Biofilm. No specimens were taken. A time out was conducted at 09:11, prior to the start of the procedure. A  Minimum amount of bleeding was controlled with Pressure. The procedure was tolerated well with a pain level of 3 throughout and a pain level of 1 following the procedure. Post Debridement Measurements: 0.7cm length x 0.6cm width x 0.1cm depth; 0.033cm^3 volume. Character of Wound/Ulcer Post Debridement requires further debridement. Severity of Tissue Post Debridement is: Fat layer exposed. Post procedure Diagnosis Wound #2: Same as Pre-Procedure Wound #3 Pre-procedure diagnosis of Wound #3 is a Diabetic Wound/Ulcer of the Lower Extremity located on the Right,Lateral Foot .Severity of Tissue Pre Debridement is: Fat layer exposed. There was a Excisional Skin/Subcutaneous Tissue Debridement with a total area of 0.8 sq cm performed by Ricard Dillon., MD. With the following instrument(s): Curette to remove Viable and Non-Viable tissue/material. Material removed includes Subcutaneous Tissue, Slough, and Biofilm. No specimens were taken. A time out was conducted at 09:11, prior to the start of the procedure. A Moderate amount of bleeding was controlled with Silver Nitrate. The procedure was tolerated well with a pain level of 3 throughout and a pain level of 1 following the procedure. Post Debridement Measurements: 0.8cm length x 1cm width x 0.1cm depth; 0.063cm^3 volume. Character of Wound/Ulcer Post Debridement requires further debridement. Severity of Tissue Post Debridement is: Fat layer exposed. Post procedure Diagnosis Wound #3: Same as Pre-Procedure Plan Follow-up Appointments: Return appointment in 3 weeks. - with Dr. Dellia Nims Bathing/ Shower/ Hygiene: May shower and wash wound with soap and water. - with dressing changes only. Off-Loading: Turn and reposition every 2 hours Other: - float heels while resting in bed or chair. Use a pillow or rolled  sheet under legs to offload the heels. Home Health: No change in wound care orders this week; continue Home Health for wound care. May utilize  formulary equivalent dressing for wound treatment orders unless otherwise specified. Other Home Health Orders/Instructions: - [facility] home health twice a week and family once a week. WOUND #1: - Calcaneus Wound Laterality: Left Cleanser: Soap and Water (Home Health) 3 x Per Week/30 Days Discharge Instructions: May shower and wash wound with dial antibacterial soap and water prior to dressing change. Cleanser: Wound Cleanser (Home Health) 3 x Per Week/30 Days Discharge Instructions: Cleanse the wound with wound cleanser prior to applying a clean dressing using gauze sponges, not tissue or cotton balls. Prim Dressing: Hydrofera Blue Classic Foam, 4x4 in (Home Health) 3 x Per Week/30 Days ary Discharge Instructions: Moisten with saline prior to applying to wound bed Prim Dressing: Santyl Ointment ([place]) 3 x Per Week/30 Days ary Discharge Instructions: Apply nickel thick amount to wound bed as instructed Secondary Dressing: Woven Gauze Sponge, Non-Sterile 4x4 in (Home Health) 3 x Per Week/30 Days Discharge Instructions: Apply over primary dressing as directed. Secondary Dressing: ALLEVYN Heel 4 1/2in x 5 1/2in / 10.5cm x 13.5cm (Home Health) 3 x Per Week/30 Days Discharge Instructions: Apply over primary dressing as directed. Secured With: [employer], 4.5x3.1 (in/yd) (Home Health) 3 x Per Week/30 Days Discharge Instructions: Secure with Kerlix as directed. Secured With: 96M Medipore H Soft Cloth Surgical T 4 x 2 (in/yd) (Home Health) 3 x Per Week/30 Days ape Discharge Instructions: Secure dressing with tape as directed. WOUND #2: - Foot Wound Laterality: Left, Lateral Cleanser: Soap and Water (Home Health) 3 x Per Week/30 Days Discharge Instructions: May shower and wash wound with dial antibacterial soap and water prior to dressing change. Cleanser: Wound Cleanser (Home Health) 3 x Per Week/30 Days Discharge Instructions: Cleanse the wound with wound cleanser prior to applying  a clean dressing using gauze sponges, not tissue or cotton balls. Prim Dressing: Hydrofera Blue Classic Foam, 4x4 in (Home Health) 3 x Per Week/30 Days ary Discharge Instructions: Moisten with saline prior to applying to wound bed Prim Dressing: Santyl Ointment ([place]) 3 x Per Week/30 Days ary Discharge Instructions: Apply nickel thick amount to wound bed as instructed Secondary Dressing: Woven Gauze Sponge, Non-Sterile 4x4 in (Home Health) 3 x Per Week/30 Days Discharge Instructions: Apply over primary dressing as directed. Secured With: [employer], 4.5x3.1 (in/yd) (Home Health) 3 x Per Week/30 Days Discharge Instructions: Secure with Kerlix as directed. Secured With: 96M Medipore H Soft Cloth Surgical T 4 x 2 (in/yd) (Home Health) 3 x Per Week/30 Days ape Discharge Instructions: Secure dressing with tape as directed. WOUND #3: - Foot Wound Laterality: Right, Lateral Cleanser: Soap and Water (Home Health) 3 x Per Week/30 Days Discharge Instructions: May shower and wash wound with dial antibacterial soap and water prior to dressing change. Cleanser: Wound Cleanser (Home Health) 3 x Per Week/30 Days Discharge Instructions: Cleanse the wound with wound cleanser prior to applying a clean dressing using gauze sponges, not tissue or cotton balls. Prim Dressing: Hydrofera Blue Classic Foam, 4x4 in (Home Health) 3 x Per Week/30 Days ary Discharge Instructions: Moisten with saline prior to applying to wound bed Prim Dressing: Santyl Ointment ([place]) 3 x Per Week/30 Days ary Discharge Instructions: Apply nickel thick amount to wound bed as instructed Secondary Dressing: Woven Gauze Sponge, Non-Sterile 4x4 in (Home Health) 3 x Per Week/30 Days Discharge Instructions: Apply over primary  dressing as directed. Secured With: [employer], 4.5x3.1 (in/yd) (Home Health) 3 x Per Week/30 Days Discharge Instructions: Secure with Kerlix as directed. Secured With: 72M Medipore H  Soft Cloth Surgical T 4 x 2 (in/yd) (Home Health) 3 x Per Week/30 Days ape Discharge Instructions: Secure dressing with tape as directed. 1. I am continuing with the [facility] and the [place] for now. 2. Now that he has been revascularized on the left we went in ahead with an attempt at debridement. On the left Achilles heel which is his dominant wound I really was not able to get to a completely viable surface. The left lateral foot and the right lateral foot both look better postdebridement I am not really expecting problems here. 3. Dr. Trula Slade mention the possibility of an attempt at recanalizing anterior tibial artery via pedal access if he has nonhealing Electronic Signature(s) Signed: 09/01/2020 5:31:22 PM By: Linton Ham MD Entered By: Linton Ham on 09/01/2020 09:40:25 -------------------------------------------------------------------------------- SuperBill Details Patient Name: Date of Service: [name] [name] HNA THA N L. 09/01/2020 Medical Record Number: [id number] Patient Account Number: [id number] Date of Birth/Sex: Treating RN: 1955/08/28 (65 y.o. [name], [name] Primary Care Provider: [name] Other Clinician: Referring Provider: Treating Provider/Extender: [name] in Treatment: 8 Diagnosis Coding ICD-10 Codes Code Description [phone] Type 2 diabetes mellitus with foot ulcer L97.528 Non-pressure chronic ulcer of other part of left foot with other specified severity L97.511 Non-pressure chronic ulcer of other part of right foot limited to breakdown of skin L89.622 Pressure ulcer of left heel, stage 2 E11.42 Type 2 diabetes mellitus with diabetic polyneuropathy Facility Procedures CPT4 Code: [id number] Description: [id number] - DEB SUBQ TISSUE 20 SQ CM/< ICD-10 Diagnosis Description L97.528 Non-pressure chronic ulcer of other part of left foot with other specified sev L97.511 Non-pressure chronic ulcer of other part of right foot limited to  breakdown of  L89.622 Pressure ulcer of left heel, stage 2 Modifier: erity skin Quantity: 1 Physician Procedures : CPT4 Code Description Modifier [id number] - WC PHYS SUBQ TISS 20 SQ CM ICD-10 Diagnosis Description L97.528 Non-pressure chronic ulcer of other part of left foot with other specified severity L97.511 Non-pressure chronic ulcer of other part of right  foot limited to breakdown of skin L89.622 Pressure ulcer of left heel, stage 2 Quantity: 1 Electronic Signature(s) Signed: 09/01/2020 5:31:22 PM By: Linton Ham MD Entered By: Linton Ham on 09/01/2020 09:40:47

## 2020-09-01 NOTE — Telephone Encounter (Signed)
  Patients mother calling regarding Eliquis  Please call patient to provide reason for denial of Eliquis

## 2020-09-01 NOTE — Progress Notes (Signed)
GARVIS, DOWNUM ([id number]) Visit Report for 09/01/2020 Arrival Information Details Patient Name: Date of Service: [name] [name] [facility] THA N L. 09/01/2020 8:00 A M Medical Record Number: [id number] Patient Account Number: [id number] Date of Birth/Sex: Treating RN: 02/26/1955 (65 y.o. [name] [name], [name] [name] Primary Care [name] [name]: [name] [name] Other Clinician: Referring [name] [name]: Treating [name] [name]/Extender: [name] [name] in Treatment: 8 Visit Information History Since Last Visit All ordered tests and consults were completed: Yes Patient Arrived: Wheel Chair Added or deleted any medications: No Arrival Time: 07:57 Any new allergies or adverse reactions: No Accompanied By: mother Had a fall or experienced change in No Transfer Assistance: None activities of daily living that may affect Patient Identification Verified: Yes risk of falls: Secondary Verification Process Completed: Yes Signs or symptoms of abuse/neglect since last visito No Patient Requires Transmission-Based Precautions: No Hospitalized since last visit: No Patient Has Alerts: Yes Implantable device outside of the clinic excluding No Patient Alerts: Patient on Blood Thinner cellular tissue based products placed in the center since last visit: Has Dressing in Place as Prescribed: Yes Pain Present Now: No Notes Angiogram 08/30/2020 with [facility]. Electronic Signature(s) Signed: 09/01/2020 5:36:38 PM By: [name] [name] Entered By: [name] [name] on 09/01/2020 07:58:17 -------------------------------------------------------------------------------- Encounter Discharge Information Details Patient Name: Date of Service: [name] [name] [facility] THA N L. 09/01/2020 8:00 A M Medical Record Number: [id number] Patient Account Number: [id number] Date of Birth/Sex: Treating RN: 1955-07-29 (65 y.o. [name] [name] Primary Care Mae Denunzio: [name] [name] Other Clinician: Referring [name] [name]: Treating  [name] [name]/Extender: [name] [name] in Treatment: 8 Encounter Discharge Information Items Post Procedure Vitals Discharge Condition: Stable Temperature (F): 98.7 Ambulatory Status: Wheelchair Pulse (bpm): 78 Discharge Destination: Home Respiratory Rate (breaths/min): 16 Transportation: Private Auto Blood Pressure (mmHg): 107/68 Accompanied By: mother Schedule Follow-up Appointment: Yes Clinical Summary of Care: Patient Declined Electronic Signature(s) Signed: 09/01/2020 5:19:32 PM By: [name] Hurst RN, BSN Entered By: [name] [name] on 09/01/2020 11:45:46 -------------------------------------------------------------------------------- Lower Extremity Assessment Details Patient Name: Date of Service: [name] [name] [facility] THA N L. 09/01/2020 8:00 A M Medical Record Number: [id number] Patient Account Number: [id number] Date of Birth/Sex: Treating RN: 02-24-1955 (65 y.o. [name] [name] Primary Care [name] [name]: [name] [name] Other Clinician: Referring [name] [name]: Treating [name] [name]/Extender: [name] [name] in Treatment: 8 Edema Assessment Assessed: [name] [name]: No] [name] [name]: No] Edema: [Left: No] [Right: No] Calf Left: Right: Point of Measurement: 34 cm From Medial Instep 31 cm 31.5 cm Ankle Left: Right: Point of Measurement: 10 cm From Medial Instep 23.5 cm 22 cm Vascular Assessment Pulses: Dorsalis Pedis Palpable: [Left:Yes] [Right:No] Electronic Signature(s) Signed: 09/01/2020 5:16:40 PM By: [name] Gouty RN, BSN Entered By: [name] [name] on 09/01/2020 08:21:47 -------------------------------------------------------------------------------- Multi Wound Chart Details Patient Name: Date of Service: [name] [name] [facility] THA N L. 09/01/2020 8:00 A M Medical Record Number: [id number] Patient Account Number: [id number] Date of Birth/Sex: Treating RN: 19-Nov-1955 (65 y.o. [name] [name] Primary Care [name] [name]: [name] [name] Other Clinician: Referring  [name] [name]: Treating [name] [name]/Extender: [name] [name] in Treatment: 8 Vital Signs Height(in): 46 Pulse(bpm): 53 Weight(lbs): 151 Blood Pressure(mmHg): 107/68 Body Mass Index(BMI): 19 Temperature(F): 98.7 Respiratory Rate(breaths/min): 16 Photos: [1:Left Calcaneus] [2:Left, Lateral Foot] [3:Right, Lateral Foot] Wound Location: [1:Pressure Injury] [2:Gradually Appeared] [3:Gradually Appeared] Wounding Event: [1:Pressure Ulcer] [2:Diabetic Wound/Ulcer of the Lower] [3:Diabetic Wound/Ulcer of the Lower] Primary Etiology: [1:Coronary Artery Disease,] [2:Extremity Coronary Artery Disease,] [3:Extremity Coronary Artery Disease,] Comorbid History: [1:Hypertension, Type II Diabetes, Osteoarthritis, Neuropathy 05/23/2020] [  2:Hypertension, Type II Diabetes, Osteoarthritis, Neuropathy 06/21/2020] [3:Hypertension, Type II Diabetes, Osteoarthritis, Neuropathy 06/21/2020] Date Acquired: [1:8] [2:8] [3:8] Weeks of Treatment: [1:Open] [2:Open] [3:Open] Wound Status: [1:4x2.6x0.1] [2:0.7x0.6x0.1] [3:0.8x1x0.1] Measurements L x W x D (cm) [1:8.168] [2:0.33] [3:0.628] A (cm) : rea [1:0.817] [2:0.033] [3:0.063] Volume (cm) : [1:35.00%] [2:53.30%] [3:68.60%] % Reduction in A [1:rea: 67.50%] [2:53.50%] [3:84.30%] % Reduction in Volume: [1:Category/Stage III] [2:Grade 1] [3:Grade 2] Classification: [1:Medium] [2:Medium] [3:Medium] Exudate A mount: [1:Serosanguineous] [2:Serosanguineous] [3:Serosanguineous] Exudate Type: [1:red, brown] [2:red, brown] [3:red, brown] Exudate Color: [1:Flat and Intact] [2:Distinct, outline attached] [3:Distinct, outline attached] Wound Margin: [1:Small (1-33%)] [2:Large (67-100%)] [3:Medium (34-66%)] Granulation A mount: [1:Pink] [2:Red, Friable] [3:Red, Pink, Friable] Granulation Quality: [1:Large (67-100%)] [2:Small (1-33%)] [3:Medium (34-66%)] Necrotic A mount: [1:Eschar, Adherent Slough] [2:Adherent Slough] [3:Adherent Slough] Necrotic Tissue: [1:Fat  Layer (Subcutaneous Tissue): Yes Fat Layer (Subcutaneous Tissue): Yes Fat Layer (Subcutaneous Tissue): Yes] Exposed Structures: [1:Fascia: No Tendon: No Muscle: No Joint: No Bone: No Small (1-33%)] [2:Fascia: No Tendon: No Muscle: No Joint: No Bone: No None] [3:Fascia: No Tendon: No Muscle: No Joint: No Bone: No None] Epithelialization: [1:Debridement - Excisional] [2:Debridement - Excisional] [3:Debridement - Excisional] Debridement: Pre-procedure Verification/Time Out 09:11 [2:09:11] [3:09:11] Taken: [1:Subcutaneous, Slough] [2:Subcutaneous, Slough] [3:Subcutaneous, Slough] Tissue Debrided: [1:Skin/Subcutaneous Tissue] [2:Skin/Subcutaneous Tissue] [3:Skin/Subcutaneous Tissue] Level: [1:4] [2:0.42] [3:0.8] Debridement A (sq cm): [1:rea Curette] [2:Curette] [3:Curette] Instrument: [1:Moderate] [2:Minimum] [3:Moderate] Bleeding: [1:Silver Nitrate] [2:Pressure] [3:Silver Nitrate] Hemostasis A chieved: [1:3] [2:3] [3:3] Procedural Pain: [1:1] [2:1] [3:1] Post Procedural Pain: [1:Procedure was tolerated well] [2:Procedure was tolerated well] [3:Procedure was tolerated well] Debridement Treatment Response: [1:4x2.6x0.1] [2:0.7x0.6x0.1] [3:0.8x1x0.1] Post Debridement Measurements L x W x D (cm) [1:0.817] [2:0.033] [3:0.063] Post Debridement Volume: (cm) [1:Category/Stage III] [2:N/A] [3:N/A] Post Debridement Stage: [1:Debridement] [2:Debridement] [3:Debridement] Treatment Notes Electronic Signature(s) Signed: 09/01/2020 5:31:22 PM By: Linton Ham MD Signed: 09/01/2020 5:36:38 PM By: [name] [name] Entered By: Linton Ham on 09/01/2020 09:32:36 -------------------------------------------------------------------------------- Multi-Disciplinary Care Plan Details Patient Name: Date of Service: [name] [name] [facility] THA N L. 09/01/2020 8:00 A M Medical Record Number: [id number] Patient Account Number: [id number] Date of Birth/Sex: Treating RN: [birth year] (65 y.o. [name] [name], [name] Primary Care  Jeren Dufrane: [name] [name] Other Clinician: Referring Deepa Barthel: Treating Wendy Mikles/Extender: [name] [name] in Treatment: 8 Active Inactive Pain, Acute or Chronic Nursing Diagnoses: Pain, acute or chronic: actual or potential Potential alteration in comfort, pain Goals: Patient will verbalize adequate pain control and receive pain control interventions during procedures as needed Date Initiated: 07/07/2020 Target Resolution Date: 09/30/2020 Goal Status: Active Patient/caregiver will verbalize comfort level met Date Initiated: 07/07/2020 Target Resolution Date: 09/30/2020 Goal Status: Active Interventions: Encourage patient to take pain medications as prescribed Provide education on pain management Reposition patient for comfort Treatment Activities: Administer pain control measures as ordered : 07/07/2020 Notes: Tissue Oxygenation Nursing Diagnoses: Actual ineffective tissue perfusion; peripheral (select once diagnosis is confirmed) Knowledge deficit related to disease process and management Goals: Non-invasive arterial studies are completed as ordered Date Initiated: 09/01/2020 Target Resolution Date: 09/01/2020 Goal Status: Active Revascularization procedures completed as ordered Date Initiated: 09/01/2020 Target Resolution Date: 08/30/2020 Goal Status: Active Interventions: Assess patient understanding of disease process and management upon diagnosis and as needed Assess peripheral arterial status upon admission and as needed Provide education on tissue oxygenation and ischemia Treatment Activities: Ankle Brachial Index (ABI) : 05/02/2020 Non-invasive vascular studies : 09/01/2020 Revascularization procedures : 09/01/2020 Notes: Wound/Skin Impairment Nursing Diagnoses: Knowledge deficit related to ulceration/compromised skin integrity Goals: Patient/caregiver will verbalize understanding of skin care  regimen Date Initiated: 07/07/2020 Target Resolution Date:  09/30/2020 Goal Status: Active Interventions: Assess patient/caregiver ability to obtain necessary supplies Assess patient/caregiver ability to perform ulcer/skin care regimen upon admission and as needed Provide education on ulcer and skin care Treatment Activities: Skin care regimen initiated : 07/07/2020 Topical wound management initiated : 07/07/2020 Notes: Electronic Signature(s) Signed: 09/01/2020 5:36:38 PM By: [name] [name] Entered By: [name] [name] on 09/01/2020 08:24:44 -------------------------------------------------------------------------------- Pain Assessment Details Patient Name: Date of Service: [name] [name] [facility] THA N L. 09/01/2020 8:00 A M Medical Record Number: [id number] Patient Account Number: [id number] Date of Birth/Sex: Treating RN: 09-06-1955 (65 y.o. [name] [name] Primary Care Maheen Cwikla: [name] [name] Other Clinician: Referring Reganne Messerschmidt: Treating Jazel Nimmons/Extender: [name] [name] in Treatment: 8 Active Problems Location of Pain Severity and Description of Pain Patient Has Paino No Site Locations Pain Management and Medication Current Pain Management: Electronic Signature(s) Signed: 09/01/2020 9:06:55 AM By: Sandre Kitty Signed: 09/01/2020 5:36:38 PM By: [name] [name] Entered By: Sandre Kitty on 09/01/2020 08:14:59 -------------------------------------------------------------------------------- Patient/Caregiver Education Details Patient Name: Date of Service: [name] [name] [facility] THA N L. 9/1/2022andnbsp8:00 A M Medical Record Number: [id number] Patient Account Number: [id number] Date of Birth/Gender: Treating RN: 1955-07-17 (65 y.o. [name] [name] Primary Care Physician: [name] [name] Other Clinician: Referring Physician: Treating Physician/Extender: [name] [name] in Treatment: 8 Education Assessment Education Provided To: Patient Education Topics Provided Tissue Oxygenation: Handouts: Peripheral  Arterial Disease and Related Ulcers, Skin Perfusion Tests Methods: Explain/Verbal Responses: Reinforcements needed Electronic Signature(s) Signed: 09/01/2020 5:36:38 PM By: [name] [name] Entered By: [name] [name] on 09/01/2020 08:24:56 -------------------------------------------------------------------------------- Wound Assessment Details Patient Name: Date of Service: [name] [name] [facility] THA N L. 09/01/2020 8:00 A M Medical Record Number: [id number] Patient Account Number: [id number] Date of Birth/Sex: Treating RN: [birth year] (65 y.o. [name] [name], [name] [name] Primary Care Delcie Ruppert: [name] [name] Other Clinician: Referring Kamori Barbier: Treating Malessa Zartman/Extender: [name] [name] in Treatment: 8 Wound Status Wound Number: 1 Primary Pressure Ulcer Etiology: Wound Location: Left Calcaneus Wound Open Wounding Event: Pressure Injury Status: Date Acquired: 05/23/2020 Comorbid Coronary Artery Disease, Hypertension, Type II Diabetes, Weeks Of Treatment: 8 History: Osteoarthritis, Neuropathy Clustered Wound: No Photos Wound Measurements Length: (cm) 4 Width: (cm) 2.6 Depth: (cm) 0.1 Area: (cm) 8.168 Volume: (cm) 0.817 % Reduction in Area: 35% % Reduction in Volume: 67.5% Epithelialization: Small (1-33%) Tunneling: No Undermining: No Wound Description Classification: Category/Stage III Wound Margin: Flat and Intact Exudate Amount: Medium Exudate Type: Serosanguineous Exudate Color: red, brown Foul Odor After Cleansing: No Slough/Fibrino Yes Wound Bed Granulation Amount: Small (1-33%) Exposed Structure Granulation Quality: Pink Fascia Exposed: No Necrotic Amount: Large (67-100%) Fat Layer (Subcutaneous Tissue) Exposed: Yes Necrotic Quality: Eschar, Adherent Slough Tendon Exposed: No Muscle Exposed: No Joint Exposed: No Bone Exposed: No Treatment Notes Wound #1 (Calcaneus) Wound Laterality: Left Cleanser Soap and Water Discharge Instruction: May shower and wash  wound with dial antibacterial soap and water prior to dressing change. Wound Cleanser Discharge Instruction: Cleanse the wound with wound cleanser prior to applying a clean dressing using gauze sponges, not tissue or cotton balls. Peri-Wound Care Topical Primary Dressing Hydrofera Blue Classic Foam, 4x4 in Discharge Instruction: Moisten with saline prior to applying to wound bed Santyl Ointment Discharge Instruction: Apply nickel thick amount to wound bed as instructed Secondary Dressing Woven Gauze Sponge, Non-Sterile 4x4 in Discharge Instruction: Apply over primary dressing as directed. ALLEVYN Heel 4 1/2in x 5 1/2in / 10.5cm x 13.5cm Discharge Instruction: Apply over primary dressing as  directed. Secured With [employer], 4.5x3.1 (in/yd) Discharge Instruction: Secure with Kerlix as directed. 59M Medipore H Soft Cloth Surgical T 4 x 2 (in/yd) ape Discharge Instruction: Secure dressing with tape as directed. Compression Wrap Compression Stockings Add-Ons Electronic Signature(s) Signed: 09/01/2020 5:16:40 PM By: [name] Gouty RN, BSN Signed: 09/01/2020 5:36:38 PM By: [name] [name] Entered By: [name] [name] on 09/01/2020 08:22:24 -------------------------------------------------------------------------------- Wound Assessment Details Patient Name: Date of Service: [name] [name] [facility] THA N L. 09/01/2020 8:00 A M Medical Record Number: [id number] Patient Account Number: [id number] Date of Birth/Sex: Treating RN: Jun 05, 1955 (65 y.o. [name] [name], [name] [name] Primary Care Maison Kestenbaum: [name] [name] Other Clinician: Referring Salome Cozby: Treating Keily Lepp/Extender: [name] [name] in Treatment: 8 Wound Status Wound Number: 2 Primary Diabetic Wound/Ulcer of the Lower Extremity Etiology: Wound Location: Left, Lateral Foot Wound Open Wounding Event: Gradually Appeared Status: Date Acquired: 06/21/2020 Comorbid Coronary Artery Disease, Hypertension, Type II  Diabetes, Weeks Of Treatment: 8 History: Osteoarthritis, Neuropathy Clustered Wound: No Photos Wound Measurements Length: (cm) 0.7 Width: (cm) 0.6 Depth: (cm) 0.1 Area: (cm) 0.33 Volume: (cm) 0.033 % Reduction in Area: 53.3% % Reduction in Volume: 53.5% Epithelialization: None Tunneling: No Undermining: No Wound Description Classification: Grade 1 Wound Margin: Distinct, outline attached Exudate Amount: Medium Exudate Type: Serosanguineous Exudate Color: red, brown Foul Odor After Cleansing: No Slough/Fibrino Yes Wound Bed Granulation Amount: Large (67-100%) Exposed Structure Granulation Quality: Red, Friable Fascia Exposed: No Necrotic Amount: Small (1-33%) Fat Layer (Subcutaneous Tissue) Exposed: Yes Necrotic Quality: Adherent Slough Tendon Exposed: No Muscle Exposed: No Joint Exposed: No Bone Exposed: No Treatment Notes Wound #2 (Foot) Wound Laterality: Left, Lateral Cleanser Soap and Water Discharge Instruction: May shower and wash wound with dial antibacterial soap and water prior to dressing change. Wound Cleanser Discharge Instruction: Cleanse the wound with wound cleanser prior to applying a clean dressing using gauze sponges, not tissue or cotton balls. Peri-Wound Care Topical Primary Dressing Hydrofera Blue Classic Foam, 4x4 in Discharge Instruction: Moisten with saline prior to applying to wound bed Santyl Ointment Discharge Instruction: Apply nickel thick amount to wound bed as instructed Secondary Dressing Woven Gauze Sponge, Non-Sterile 4x4 in Discharge Instruction: Apply over primary dressing as directed. Secured With [employer], 4.5x3.1 (in/yd) Discharge Instruction: Secure with Kerlix as directed. 59M Medipore H Soft Cloth Surgical T 4 x 2 (in/yd) ape Discharge Instruction: Secure dressing with tape as directed. Compression Wrap Compression Stockings Add-Ons Electronic Signature(s) Signed: 09/01/2020 5:16:40 PM By: [name] Gouty  RN, BSN Signed: 09/01/2020 5:36:38 PM By: [name] [name] Entered By: [name] [name] on 09/01/2020 08:22:56 -------------------------------------------------------------------------------- Wound Assessment Details Patient Name: Date of Service: [name] [name] [facility] THA N L. 09/01/2020 8:00 A M Medical Record Number: [id number] Patient Account Number: [id number] Date of Birth/Sex: Treating RN: Nov 10, 1955 (64 y.o. [name] [name], [name] [name] Primary Care Stevan Eberwein: [name] [name] Other Clinician: Referring Vestal Crandall: Treating Arionna Hoggard/Extender: [name] [name] in Treatment: 8 Wound Status Wound Number: 3 Primary Diabetic Wound/Ulcer of the Lower Extremity Etiology: Wound Location: Right, Lateral Foot Wound Open Wounding Event: Gradually Appeared Status: Date Acquired: 06/21/2020 Comorbid Coronary Artery Disease, Hypertension, Type II Diabetes, Weeks Of Treatment: 8 History: Osteoarthritis, Neuropathy Clustered Wound: No Photos Wound Measurements Length: (cm) 0.8 Width: (cm) 1 Depth: (cm) 0.1 Area: (cm) 0.628 Volume: (cm) 0.063 % Reduction in Area: 68.6% % Reduction in Volume: 84.3% Epithelialization: None Tunneling: No Undermining: No Wound Description Classification: Grade 2 Wound Margin: Distinct, outline attached Exudate Amount: Medium Exudate Type: Serosanguineous Exudate Color: red, brown  Foul Odor After Cleansing: No Slough/Fibrino Yes Wound Bed Granulation Amount: Medium (34-66%) Exposed Structure Granulation Quality: Red, Pink, Friable Fascia Exposed: No Necrotic Amount: Medium (34-66%) Fat Layer (Subcutaneous Tissue) Exposed: Yes Necrotic Quality: Adherent Slough Tendon Exposed: No Muscle Exposed: No Joint Exposed: No Bone Exposed: No Treatment Notes Wound #3 (Foot) Wound Laterality: Right, Lateral Cleanser Soap and Water Discharge Instruction: May shower and wash wound with dial antibacterial soap and water prior to dressing change. Wound  Cleanser Discharge Instruction: Cleanse the wound with wound cleanser prior to applying a clean dressing using gauze sponges, not tissue or cotton balls. Peri-Wound Care Topical Primary Dressing Hydrofera Blue Classic Foam, 4x4 in Discharge Instruction: Moisten with saline prior to applying to wound bed Santyl Ointment Discharge Instruction: Apply nickel thick amount to wound bed as instructed Secondary Dressing Woven Gauze Sponge, Non-Sterile 4x4 in Discharge Instruction: Apply over primary dressing as directed. Secured With [employer], 4.5x3.1 (in/yd) Discharge Instruction: Secure with Kerlix as directed. 79M Medipore H Soft Cloth Surgical T 4 x 2 (in/yd) ape Discharge Instruction: Secure dressing with tape as directed. Compression Wrap Compression Stockings Add-Ons Electronic Signature(s) Signed: 09/01/2020 5:16:40 PM By: [name] Gouty RN, BSN Signed: 09/01/2020 5:36:38 PM By: [name] [name] Entered By: [name] [name] on 09/01/2020 08:23:20 -------------------------------------------------------------------------------- Vitals Details Patient Name: Date of Service: [name] [name] [facility]NA THA N L. 09/01/2020 8:00 A M Medical Record Number: [id number] Patient Account Number: [id number] Date of Birth/Sex: Treating RN: 12/04/1955 (65 y.o. [name] [name], [name] Primary Care Lilas Diefendorf: [name] [name] Other Clinician: Referring Ilene Witcher: Treating Norvel Wenker/Extender: [name] [name] in Treatment: 8 Vital Signs Time Taken: 08:14 Temperature (F): 98.7 Height (in): 74 Pulse (bpm): 78 Weight (lbs): 151 Respiratory Rate (breaths/min): 16 Body Mass Index (BMI): 19.4 Blood Pressure (mmHg): 107/68 Reference Range: 80 - 120 mg / dl Electronic Signature(s) Signed: 09/01/2020 9:06:55 AM By: Sandre Kitty Entered By: Sandre Kitty on 09/01/2020 08:14:49

## 2020-09-01 NOTE — Telephone Encounter (Signed)
Can you please provide your rationale in why the medication refill is not appropriate and I will relay the information to the patient. Thank you.

## 2020-09-02 DIAGNOSIS — D649 Anemia, unspecified: Secondary | ICD-10-CM | POA: Diagnosis not present

## 2020-09-02 DIAGNOSIS — M47812 Spondylosis without myelopathy or radiculopathy, cervical region: Secondary | ICD-10-CM | POA: Diagnosis not present

## 2020-09-02 DIAGNOSIS — E1142 Type 2 diabetes mellitus with diabetic polyneuropathy: Secondary | ICD-10-CM | POA: Diagnosis not present

## 2020-09-02 DIAGNOSIS — L97423 Non-pressure chronic ulcer of left heel and midfoot with necrosis of muscle: Secondary | ICD-10-CM | POA: Diagnosis not present

## 2020-09-02 DIAGNOSIS — E11621 Type 2 diabetes mellitus with foot ulcer: Secondary | ICD-10-CM | POA: Diagnosis not present

## 2020-09-02 DIAGNOSIS — L97511 Non-pressure chronic ulcer of other part of right foot limited to breakdown of skin: Secondary | ICD-10-CM | POA: Diagnosis not present

## 2020-09-02 DIAGNOSIS — K5904 Chronic idiopathic constipation: Secondary | ICD-10-CM | POA: Diagnosis not present

## 2020-09-02 DIAGNOSIS — N401 Enlarged prostate with lower urinary tract symptoms: Secondary | ICD-10-CM | POA: Diagnosis not present

## 2020-09-02 DIAGNOSIS — F419 Anxiety disorder, unspecified: Secondary | ICD-10-CM | POA: Diagnosis not present

## 2020-09-02 DIAGNOSIS — Z7901 Long term (current) use of anticoagulants: Secondary | ICD-10-CM | POA: Diagnosis not present

## 2020-09-02 DIAGNOSIS — I2510 Atherosclerotic heart disease of native coronary artery without angina pectoris: Secondary | ICD-10-CM | POA: Diagnosis not present

## 2020-09-02 DIAGNOSIS — Z7982 Long term (current) use of aspirin: Secondary | ICD-10-CM | POA: Diagnosis not present

## 2020-09-02 DIAGNOSIS — F3341 Major depressive disorder, recurrent, in partial remission: Secondary | ICD-10-CM | POA: Diagnosis not present

## 2020-09-02 DIAGNOSIS — E785 Hyperlipidemia, unspecified: Secondary | ICD-10-CM | POA: Diagnosis not present

## 2020-09-02 DIAGNOSIS — M720 Palmar fascial fibromatosis [Dupuytren]: Secondary | ICD-10-CM | POA: Diagnosis not present

## 2020-09-02 DIAGNOSIS — E1151 Type 2 diabetes mellitus with diabetic peripheral angiopathy without gangrene: Secondary | ICD-10-CM | POA: Diagnosis not present

## 2020-09-02 DIAGNOSIS — I480 Paroxysmal atrial fibrillation: Secondary | ICD-10-CM | POA: Diagnosis not present

## 2020-09-02 DIAGNOSIS — I10 Essential (primary) hypertension: Secondary | ICD-10-CM | POA: Diagnosis not present

## 2020-09-02 DIAGNOSIS — Z794 Long term (current) use of insulin: Secondary | ICD-10-CM | POA: Diagnosis not present

## 2020-09-02 DIAGNOSIS — S42132D Displaced fracture of coracoid process, left shoulder, subsequent encounter for fracture with routine healing: Secondary | ICD-10-CM | POA: Diagnosis not present

## 2020-09-02 DIAGNOSIS — G8929 Other chronic pain: Secondary | ICD-10-CM | POA: Diagnosis not present

## 2020-09-02 DIAGNOSIS — L409 Psoriasis, unspecified: Secondary | ICD-10-CM | POA: Diagnosis not present

## 2020-09-02 DIAGNOSIS — L97523 Non-pressure chronic ulcer of other part of left foot with necrosis of muscle: Secondary | ICD-10-CM | POA: Diagnosis not present

## 2020-09-02 DIAGNOSIS — N138 Other obstructive and reflux uropathy: Secondary | ICD-10-CM | POA: Diagnosis not present

## 2020-09-02 DIAGNOSIS — M1990 Unspecified osteoarthritis, unspecified site: Secondary | ICD-10-CM | POA: Diagnosis not present

## 2020-09-02 NOTE — Telephone Encounter (Signed)
Called pt, LVM to discuss.   Rx was denied due to pt taking Plavix per Epic.

## 2020-09-04 ENCOUNTER — Telehealth: Payer: Self-pay

## 2020-09-06 ENCOUNTER — Other Ambulatory Visit: Payer: Self-pay

## 2020-09-06 DIAGNOSIS — E1042 Type 1 diabetes mellitus with diabetic polyneuropathy: Secondary | ICD-10-CM

## 2020-09-06 NOTE — Telephone Encounter (Signed)
Denied. Pt is no longer taking this med per Epic.

## 2020-09-06 NOTE — Telephone Encounter (Signed)
Patient calling in about rx refill denial  Says Dr. Trula Slade (vascular & vein surgeon) wants patient taking both plavix (30 days only) & eliquis   Patient gave Dr. Trula Slade # in case we needed to follow up [phone]

## 2020-09-06 NOTE — Telephone Encounter (Signed)
Vascular and Vein Specialist called   Requesting clarification on eliquis. Pt was seen for a intervention today and they want to do 3 rounds of the Plavix and Eliquis. After finishing the rounds the provider would like to d/c Plavix and keep him on Eliquis. They are sending over progress notes via fax for Dr.Jones to review.

## 2020-09-07 ENCOUNTER — Telehealth: Payer: Self-pay | Admitting: Nutrition

## 2020-09-07 NOTE — Telephone Encounter (Signed)
Noted  

## 2020-09-07 NOTE — Telephone Encounter (Signed)
Sister says she has contacted [facility], saying they sent paperwork to the office for pump.  Please return asap.

## 2020-09-11 ENCOUNTER — Other Ambulatory Visit: Payer: Self-pay | Admitting: Internal Medicine

## 2020-09-11 DIAGNOSIS — I480 Paroxysmal atrial fibrillation: Secondary | ICD-10-CM

## 2020-09-11 MED ORDER — APIXABAN 5 MG PO TABS: 5.0000 mg | ORAL_TABLET | Freq: Two times a day (BID) | ORAL | 2 refills | Status: DC

## 2020-09-12 ENCOUNTER — Other Ambulatory Visit: Payer: Self-pay

## 2020-09-12 DIAGNOSIS — I7025 Atherosclerosis of native arteries of other extremities with ulceration: Secondary | ICD-10-CM

## 2020-09-12 NOTE — Addendum Note (Signed)
Addended byDoylene Bode on: 09/12/2020 08:56 AM   Modules accepted: Orders

## 2020-09-13 ENCOUNTER — Telehealth: Payer: Self-pay | Admitting: Nutrition

## 2020-09-13 ENCOUNTER — Other Ambulatory Visit: Payer: Self-pay | Admitting: Endocrinology

## 2020-09-13 ENCOUNTER — Ambulatory Visit: Payer: HMO | Admitting: Orthopaedic Surgery

## 2020-09-13 MED ORDER — OMNIPOD 5 DEXG7G6 INTRO GEN 5 KIT: 1.0000 | PACK | 3 refills | Status: DC

## 2020-09-13 NOTE — Telephone Encounter (Signed)
Mother says [name] [name] is denying the dexcom Please send script for OmniPod 5 with pods to [name] with patient getting 1 pod every 3 days.  Make sure diagnosis is for type 1 diabetes.  Thank you Mother was told that he will expect a text from [facility] and must respond to the test before pump will ship.

## 2020-09-13 NOTE — Telephone Encounter (Signed)
Papers were faxed on 08/23/2020

## 2020-09-14 DIAGNOSIS — L97429 Non-pressure chronic ulcer of left heel and midfoot with unspecified severity: Secondary | ICD-10-CM | POA: Diagnosis not present

## 2020-09-14 DIAGNOSIS — L97529 Non-pressure chronic ulcer of other part of left foot with unspecified severity: Secondary | ICD-10-CM | POA: Diagnosis not present

## 2020-09-14 DIAGNOSIS — L97519 Non-pressure chronic ulcer of other part of right foot with unspecified severity: Secondary | ICD-10-CM | POA: Diagnosis not present

## 2020-09-14 DIAGNOSIS — L89154 Pressure ulcer of sacral region, stage 4: Secondary | ICD-10-CM | POA: Diagnosis not present

## 2020-09-15 ENCOUNTER — Other Ambulatory Visit (HOSPITAL_COMMUNITY): Payer: Self-pay | Admitting: Surgery

## 2020-09-15 DIAGNOSIS — I739 Peripheral vascular disease, unspecified: Secondary | ICD-10-CM

## 2020-09-16 DIAGNOSIS — L97519 Non-pressure chronic ulcer of other part of right foot with unspecified severity: Secondary | ICD-10-CM | POA: Diagnosis not present

## 2020-09-16 DIAGNOSIS — L89154 Pressure ulcer of sacral region, stage 4: Secondary | ICD-10-CM | POA: Diagnosis not present

## 2020-09-16 DIAGNOSIS — L97429 Non-pressure chronic ulcer of left heel and midfoot with unspecified severity: Secondary | ICD-10-CM | POA: Diagnosis not present

## 2020-09-16 DIAGNOSIS — L97529 Non-pressure chronic ulcer of other part of left foot with unspecified severity: Secondary | ICD-10-CM | POA: Diagnosis not present

## 2020-09-19 ENCOUNTER — Ambulatory Visit (INDEPENDENT_AMBULATORY_CARE_PROVIDER_SITE_OTHER)
Admission: RE | Admit: 2020-09-19 | Discharge: 2020-09-19 | Disposition: A | Discharge status: Home / Self Care | Payer: HMO | Source: Ambulatory Visit | Attending: Surgery | Admitting: Surgery

## 2020-09-19 ENCOUNTER — Ambulatory Visit (HOSPITAL_COMMUNITY)
Admission: RE | Admit: 2020-09-19 | Discharge: 2020-09-19 | Disposition: A | Discharge status: Home / Self Care | Payer: HMO | Source: Ambulatory Visit | Attending: Surgery | Admitting: Surgery

## 2020-09-19 ENCOUNTER — Other Ambulatory Visit: Payer: Self-pay

## 2020-09-19 DIAGNOSIS — D649 Anemia, unspecified: Secondary | ICD-10-CM | POA: Diagnosis not present

## 2020-09-19 DIAGNOSIS — E1142 Type 2 diabetes mellitus with diabetic polyneuropathy: Secondary | ICD-10-CM | POA: Diagnosis not present

## 2020-09-19 DIAGNOSIS — M720 Palmar fascial fibromatosis [Dupuytren]: Secondary | ICD-10-CM | POA: Diagnosis not present

## 2020-09-19 DIAGNOSIS — Z794 Long term (current) use of insulin: Secondary | ICD-10-CM | POA: Diagnosis not present

## 2020-09-19 DIAGNOSIS — Z7982 Long term (current) use of aspirin: Secondary | ICD-10-CM | POA: Diagnosis not present

## 2020-09-19 DIAGNOSIS — M1990 Unspecified osteoarthritis, unspecified site: Secondary | ICD-10-CM | POA: Diagnosis not present

## 2020-09-19 DIAGNOSIS — E1151 Type 2 diabetes mellitus with diabetic peripheral angiopathy without gangrene: Secondary | ICD-10-CM | POA: Diagnosis not present

## 2020-09-19 DIAGNOSIS — M47812 Spondylosis without myelopathy or radiculopathy, cervical region: Secondary | ICD-10-CM | POA: Diagnosis not present

## 2020-09-19 DIAGNOSIS — N401 Enlarged prostate with lower urinary tract symptoms: Secondary | ICD-10-CM | POA: Diagnosis not present

## 2020-09-19 DIAGNOSIS — I480 Paroxysmal atrial fibrillation: Secondary | ICD-10-CM | POA: Diagnosis not present

## 2020-09-19 DIAGNOSIS — I2510 Atherosclerotic heart disease of native coronary artery without angina pectoris: Secondary | ICD-10-CM | POA: Diagnosis not present

## 2020-09-19 DIAGNOSIS — L97523 Non-pressure chronic ulcer of other part of left foot with necrosis of muscle: Secondary | ICD-10-CM | POA: Diagnosis not present

## 2020-09-19 DIAGNOSIS — Z7901 Long term (current) use of anticoagulants: Secondary | ICD-10-CM | POA: Diagnosis not present

## 2020-09-19 DIAGNOSIS — L409 Psoriasis, unspecified: Secondary | ICD-10-CM | POA: Diagnosis not present

## 2020-09-19 DIAGNOSIS — I739 Peripheral vascular disease, unspecified: Secondary | ICD-10-CM | POA: Insufficient documentation

## 2020-09-19 DIAGNOSIS — I10 Essential (primary) hypertension: Secondary | ICD-10-CM | POA: Diagnosis not present

## 2020-09-19 DIAGNOSIS — I7025 Atherosclerosis of native arteries of other extremities with ulceration: Secondary | ICD-10-CM

## 2020-09-19 DIAGNOSIS — S42132D Displaced fracture of coracoid process, left shoulder, subsequent encounter for fracture with routine healing: Secondary | ICD-10-CM | POA: Diagnosis not present

## 2020-09-19 DIAGNOSIS — E11621 Type 2 diabetes mellitus with foot ulcer: Secondary | ICD-10-CM | POA: Diagnosis not present

## 2020-09-19 DIAGNOSIS — E785 Hyperlipidemia, unspecified: Secondary | ICD-10-CM | POA: Diagnosis not present

## 2020-09-19 DIAGNOSIS — F3341 Major depressive disorder, recurrent, in partial remission: Secondary | ICD-10-CM | POA: Diagnosis not present

## 2020-09-19 DIAGNOSIS — F419 Anxiety disorder, unspecified: Secondary | ICD-10-CM | POA: Diagnosis not present

## 2020-09-19 DIAGNOSIS — L97511 Non-pressure chronic ulcer of other part of right foot limited to breakdown of skin: Secondary | ICD-10-CM | POA: Diagnosis not present

## 2020-09-19 DIAGNOSIS — L97423 Non-pressure chronic ulcer of left heel and midfoot with necrosis of muscle: Secondary | ICD-10-CM | POA: Diagnosis not present

## 2020-09-19 DIAGNOSIS — K5904 Chronic idiopathic constipation: Secondary | ICD-10-CM | POA: Diagnosis not present

## 2020-09-19 DIAGNOSIS — G8929 Other chronic pain: Secondary | ICD-10-CM | POA: Diagnosis not present

## 2020-09-19 DIAGNOSIS — N138 Other obstructive and reflux uropathy: Secondary | ICD-10-CM | POA: Diagnosis not present

## 2020-09-22 ENCOUNTER — Other Ambulatory Visit: Payer: Self-pay

## 2020-09-22 ENCOUNTER — Encounter (HOSPITAL_BASED_OUTPATIENT_CLINIC_OR_DEPARTMENT_OTHER): Payer: HMO | Admitting: Internal Medicine

## 2020-09-22 DIAGNOSIS — L89624 Pressure ulcer of left heel, stage 4: Secondary | ICD-10-CM | POA: Diagnosis not present

## 2020-09-22 DIAGNOSIS — E11621 Type 2 diabetes mellitus with foot ulcer: Secondary | ICD-10-CM | POA: Diagnosis not present

## 2020-09-22 DIAGNOSIS — A499 Bacterial infection, unspecified: Secondary | ICD-10-CM | POA: Diagnosis not present

## 2020-09-22 NOTE — Progress Notes (Signed)
[name] [name] ([id number]) Visit Report for 09/22/2020 Arrival Information Details Patient Name: Date of Service: [name] [name] [facility] THA N L. 09/22/2020 8:00 A M Medical Record Number: [id number] Patient Account Number: [id number] Date of Birth/Sex: Treating RN: [birth year] (65 y.o. [name] [name] Primary Care [name] [name]: [name] [name] Other Clinician: Referring [name] [name]: Treating [name] [name]/Extender: [name] [name] in Treatment: 11 Visit Information History Since Last Visit All ordered tests and consults were completed: Yes Patient Arrived: Wheel Chair Added or deleted any medications: No Arrival Time: 07:50 Any new allergies or adverse reactions: No Accompanied By: mother Had a fall or experienced change in No Transfer Assistance: None activities of daily living that may affect Patient Identification Verified: Yes risk of falls: Secondary Verification Process Completed: Yes Signs or symptoms of abuse/neglect since last visito No Patient Requires Transmission-Based Precautions: No Hospitalized since last visit: No Patient Has Alerts: Yes Implantable device outside of the clinic excluding No Patient Alerts: Patient on Blood Thinner cellular tissue based products placed in the center since last visit: Has Dressing in Place as Prescribed: Yes Pain Present Now: Yes Electronic Signature(s) Signed: 09/22/2020 5:22:33 PM By: [name] Pilling RN, BSN Entered By: [name] [name] on 09/22/2020 07:51:46 -------------------------------------------------------------------------------- Encounter Discharge Information Details Patient Name: Date of Service: [name] [name] [name] THA N L. 09/22/2020 8:00 A M Medical Record Number: [id number] Patient Account Number: [id number] Date of Birth/Sex: Treating RN: [birth year] (65 y.o. [name] [name] Primary Care [name] [name]: [name] [name] Other Clinician: Referring Aldona Bryner: Treating Yelina Sarratt/Extender: [name] [name] in  Treatment: 11 Encounter Discharge Information Items Post Procedure Vitals Discharge Condition: Stable Temperature (F): 98.3 Ambulatory Status: Wheelchair Pulse (bpm): 64 Discharge Destination: Home Respiratory Rate (breaths/min): 16 Transportation: Private Auto Blood Pressure (mmHg): 97/60 Accompanied By: mother Schedule Follow-up Appointment: Yes Clinical Summary of Care: Electronic Signature(s) Signed: 09/22/2020 5:22:33 PM By: [name] Pilling RN, BSN Entered By: [name] [name] on 09/22/2020 08:37:04 -------------------------------------------------------------------------------- Lower Extremity Assessment Details Patient Name: Date of Service: [name] [name] [name] THA N L. 09/22/2020 8:00 A M Medical Record Number: [id number] Patient Account Number: [id number] Date of Birth/Sex: Treating RN: 1955/10/17 (65 y.o. [name] [name] Primary Care [name] [name]: [name] [name] Other Clinician: Referring [name] [name]: Treating [name] [name]/Extender: [name] [name] in Treatment: 11 Edema Assessment Assessed: [name] [name]: Yes] [name] [name]: Yes] Edema: [Left: No] [Right: No] Calf Left: Right: Point of Measurement: 34 cm From Medial Instep 30 cm 29.5 cm Ankle Left: Right: Point of Measurement: 10 cm From Medial Instep 23 cm 22 cm Vascular Assessment Pulses: Dorsalis Pedis Palpable: [Left:Yes] [Right:Yes] Electronic Signature(s) Signed: 09/22/2020 5:22:33 PM By: [name] Pilling RN, BSN Entered By: [name] [name] on 09/22/2020 07:55:56 -------------------------------------------------------------------------------- Multi Wound Chart Details Patient Name: Date of Service: [name] [name] [name] THA N L. 09/22/2020 8:00 A M Medical Record Number: [id number] Patient Account Number: [id number] Date of Birth/Sex: Treating RN: 12/20/1955 (65 y.o. [name] [name] Primary Care [name] [name]: [name] [name] Other Clinician: Referring [name] [name]: Treating [name] [name] in  Treatment: 11 Vital Signs Height(in): 44 Pulse(bpm): 65 Weight(lbs): 151 Blood Pressure(mmHg): 10/60 Body Mass Index(BMI): 19 Temperature(F): 98.3 Respiratory Rate(breaths/min): 16 Photos: [1:Left Calcaneus] [2:Left, Lateral Foot] [3:Right, Lateral Foot] Wound Location: [1:Pressure Injury] [2:Gradually Appeared] [3:Gradually Appeared] Wounding Event: [1:Pressure Ulcer] [2:Diabetic Wound/Ulcer of the Lower] [3:Diabetic Wound/Ulcer of the Lower] Primary Etiology: [1:Coronary Artery Disease,] [2:Extremity Coronary Artery Disease,] [3:Extremity Coronary Artery Disease,] Comorbid History: [1:Hypertension, Type II Diabetes, Osteoarthritis, Neuropathy 05/23/2020] [2:Hypertension, Type II Diabetes, Osteoarthritis, Neuropathy 06/21/2020] [3:Hypertension, Type  II Diabetes, Osteoarthritis, Neuropathy 06/21/2020] Date Acquired: [1:11] [2:11] [3:11] Weeks of Treatment: [1:Open] [2:Healed - Epithelialized] [3:Open] Wound Status: [1:3.4x2.9x1] [2:0x0x0] [3:0.7x1x0.1] Measurements L x W x D (cm) [1:7.744] [2:0] [3:0.55] A (cm) : rea [1:7.744] [2:0] [3:0.055] Volume (cm) : [1:38.40%] [2:100.00%] [3:72.50%] % Reduction in A [1:rea: -208.20%] [2:100.00%] [3:86.30%] % Reduction in Volume: [1:Category/Stage IV] [2:Grade 1] [3:Grade 2] Classification: [1:Medium] [2:None Present] [3:Medium] Exudate A mount: [1:Serosanguineous] [2:N/A] [3:Serosanguineous] Exudate Type: [1:red, brown] [2:N/A] [3:red, brown] Exudate Color: [1:Flat and Intact] [2:Distinct, outline attached] [3:Distinct, outline attached] Wound Margin: [1:Medium (34-66%)] [2:Large (67-100%)] [3:Large (67-100%)] Granulation A mount: [1:Pink] [2:Red, Friable] [3:Red, Pink, Friable] Granulation Quality: [1:Medium (34-66%)] [2:None Present (0%)] [3:Small (1-33%)] Necrotic A mount: [1:Fat Layer (Subcutaneous Tissue): Yes Fascia: No] [3:Fat Layer (Subcutaneous Tissue): Yes] Exposed Structures: [1:Bone: Yes Fascia: No Tendon: No Muscle: No Joint: No  Small (1-33%)] [2:Fat Layer (Subcutaneous Tissue): No Tendon: No Muscle: No Joint: No Bone: No Large (67-100%)] [3:Fascia: No Tendon: No Muscle: No Joint: No Bone: No Small (1-33%)] Epithelialization: [1:Debridement - Excisional] [2:N/A] [3:N/A] Debridement: Pre-procedure Verification/Time Out 08:20 [2:N/A] [3:N/A] Taken: [1:Other] [2:N/A] [3:N/A] Pain Control: [1:Subcutaneous, Slough] [2:N/A] [3:N/A] Tissue Debrided: [1:Skin/Subcutaneous Tissue] [2:N/A] [3:N/A] Level: [1:9.86] [2:N/A] [3:N/A] Debridement A (sq cm): [1:rea Curette] [2:N/A] [3:N/A] Instrument: [1:Moderate] [2:N/A] [3:N/A] Bleeding: [1:Pressure] [2:N/A] [3:N/A] Hemostasis A chieved: [1:0] [2:N/A] [3:N/A] Procedural Pain: [1:0] [2:N/A] [3:N/A] Post Procedural Pain: [1:Procedure was tolerated well] [2:N/A] [3:N/A] Debridement Treatment Response: [1:3.4x2.9x1] [2:N/A] [3:N/A] Post Debridement Measurements L x W x D (cm) [1:7.744] [2:N/A] [3:N/A] Post Debridement Volume: (cm) [1:Category/Stage IV] [2:N/A] [3:N/A] Post Debridement Stage: [1:Debridement] [2:N/A] [3:N/A] Treatment Notes Wound #1 (Calcaneus) Wound Laterality: Left Cleanser Soap and Water Discharge Instruction: May shower and wash wound with dial antibacterial soap and water prior to dressing change. Wound Cleanser Discharge Instruction: Cleanse the wound with wound cleanser prior to applying a clean dressing using gauze sponges, not tissue or cotton balls. Peri-Wound Care Topical Primary Dressing Hydrofera Blue Classic Foam, 4x4 in Discharge Instruction: Moisten with saline prior to applying to wound bed Santyl Ointment Discharge Instruction: Apply nickel thick amount to wound bed as instructed Secondary Dressing Woven Gauze Sponge, Non-Sterile 4x4 in Discharge Instruction: Apply over primary dressing as directed. ALLEVYN Heel 4 1/2in x 5 1/2in / 10.5cm x 13.5cm Discharge Instruction: Apply over primary dressing as directed. Secured With [employer], 4.5x3.1 (in/yd) Discharge Instruction: Secure with Kerlix as directed. 39M Medipore H Soft Cloth Surgical T 4 x 2 (in/yd) ape Discharge Instruction: Secure dressing with tape as directed. Compression Wrap Compression Stockings Add-Ons Wound #3 (Foot) Wound Laterality: Right, Lateral Cleanser Soap and Water Discharge Instruction: May shower and wash wound with dial antibacterial soap and water prior to dressing change. Wound Cleanser Discharge Instruction: Cleanse the wound with wound cleanser prior to applying a clean dressing using gauze sponges, not tissue or cotton balls. Peri-Wound Care Topical Primary Dressing Hydrofera Blue Classic Foam, 4x4 in Discharge Instruction: Moisten with saline prior to applying to wound bed Secondary Dressing Woven Gauze Sponge, Non-Sterile 4x4 in Discharge Instruction: Apply over primary dressing as directed. Secured With [employer], 4.5x3.1 (in/yd) Discharge Instruction: Secure with Kerlix as directed. 39M Medipore H Soft Cloth Surgical T 4 x 2 (in/yd) ape Discharge Instruction: Secure dressing with tape as directed. Compression Wrap Compression Stockings Add-Ons Electronic Signature(s) Signed: 09/22/2020 4:58:28 PM By: Linton Ham MD Signed: 09/22/2020 5:22:33 PM By: [name] Pilling RN, BSN Entered By: Linton Ham on 09/22/2020 08:45:25 -------------------------------------------------------------------------------- Multi-Disciplinary Care Plan Details  Patient Name: Date of Service: [name] [name] [facility] THA N L. 09/22/2020 8:00 A M Medical Record Number: [id number] Patient Account Number: [id number] Date of Birth/Sex: Treating RN: 22-Jul-1955 (65 y.o. [name] [name] Primary Care Aden Sek: [name] [name] Other Clinician: Referring Tekoa Amon: Treating Lysandra Loughmiller/Extender: [name] [name] in Treatment: 11 Active Inactive Necrotic Tissue Nursing Diagnoses: Impaired tissue integrity related to  necrotic/devitalized tissue Knowledge deficit related to management of necrotic/devitalized tissue Goals: Necrotic/devitalized tissue will be minimized in the wound bed Date Initiated: 09/22/2020 Target Resolution Date: 10/07/2020 Goal Status: Active Patient/caregiver will verbalize understanding of reason and process for debridement of necrotic tissue Date Initiated: 09/22/2020 Target Resolution Date: 10/28/2020 Goal Status: Active Interventions: Provide education on necrotic tissue and debridement process Treatment Activities: Enzymatic debridement : 09/22/2020 Excisional debridement : 09/22/2020 Notes: Pain, Acute or Chronic Nursing Diagnoses: Pain, acute or chronic: actual or potential Potential alteration in comfort, pain Goals: Patient will verbalize adequate pain control and receive pain control interventions during procedures as needed Date Initiated: 07/07/2020 Target Resolution Date: 09/30/2020 Goal Status: Active Patient/caregiver will verbalize comfort level met Date Initiated: 07/07/2020 Date Inactivated: 09/22/2020 Target Resolution Date: 09/30/2020 Goal Status: Met Interventions: Encourage patient to take pain medications as prescribed Provide education on pain management Reposition patient for comfort Treatment Activities: Administer pain control measures as ordered : 07/07/2020 Notes: Wound/Skin Impairment Nursing Diagnoses: Knowledge deficit related to ulceration/compromised skin integrity Goals: Patient/caregiver will verbalize understanding of skin care regimen Date Initiated: 07/07/2020 Target Resolution Date: 10/21/2020 Goal Status: Active Interventions: Assess patient/caregiver ability to obtain necessary supplies Assess patient/caregiver ability to perform ulcer/skin care regimen upon admission and as needed Provide education on ulcer and skin care Treatment Activities: Skin care regimen initiated : 07/07/2020 Topical wound management initiated :  07/07/2020 Notes: Electronic Signature(s) Signed: 09/22/2020 5:22:33 PM By: [name] Pilling RN, BSN Signed: 09/22/2020 5:22:33 PM By: [name] Pilling RN, BSN Entered By: [name] [name] on 09/22/2020 08:12:14 -------------------------------------------------------------------------------- Pain Assessment Details Patient Name: Date of Service: [name] [name] [name] THA N L. 09/22/2020 8:00 A M Medical Record Number: [id number] Patient Account Number: [id number] Date of Birth/Sex: Treating RN: 21-Apr-1955 (65 y.o. [name] [name] Primary Care Tiaira Arambula: [name] [name] Other Clinician: Referring Kortne All: Treating Shavaughn Seidl/Extender: [name] [name] in Treatment: 11 Active Problems Location of Pain Severity and Description of Pain Patient Has Paino Yes Site Locations Pain Location: Generalized Pain, Pain in Ulcers Rate the pain. Current Pain Level: 7 Worst Pain Level: 10 Least Pain Level: 0 Tolerable Pain Level: 8 Character of Pain Describe the Pain: Aching, Heavy Pain Management and Medication Current Pain Management: Medication: No Cold Application: No Rest: No Massage: No Activity: No T.E.N.S.: No Heat Application: No Leg drop or elevation: No Is the Current Pain Management Adequate: Adequate How does your wound impact your activities of daily livingo Sleep: No Bathing: No Appetite: No Relationship With Others: No Bladder Continence: No Emotions: No Bowel Continence: No Work: No Toileting: No Drive: No Dressing: No Hobbies: No Engineer, maintenance) Signed: 09/22/2020 5:22:33 PM By: [name] Pilling RN, BSN Entered By: [name] [name] on 09/22/2020 07:52:04 -------------------------------------------------------------------------------- Patient/Caregiver Education Details Patient Name: Date of Service: [name] [name] [name] THA N L. 9/22/2022andnbsp8:00 A M Medical Record Number: [id number] Patient Account Number: [id number] Date of Birth/Gender: Treating  RN: 1955-08-31 (65 y.o. [name] [name] Primary Care Physician: [name] [name] Other Clinician: Referring Physician: Treating Physician/Extender: [name] [name] in Treatment: 11 Education Assessment Education Provided To: Patient and Caregiver Education Topics Provided  Pressure: Handouts: Pressure Ulcers: Care and Offloading, Pressure Ulcers: Care and Offloading 2, Preventing Pressure Ulcers Methods: Explain/Verbal, Printed Responses: Reinforcements needed Electronic Signature(s) Signed: 09/22/2020 5:22:33 PM By: [name] Pilling RN, BSN Entered By: [name] [name] on 09/22/2020 08:12:40 -------------------------------------------------------------------------------- Wound Assessment Details Patient Name: Date of Service: [name] [name] [name] THA N L. 09/22/2020 8:00 A M Medical Record Number: [id number] Patient Account Number: [id number] Date of Birth/Sex: Treating RN: [birth year] (65 y.o. [name] [name], [name] Primary Care Shamia Uppal: [name] [name] Other Clinician: Referring Rebecah Dangerfield: Treating [place] Oborn/Extender: [name] [name] in Treatment: 11 Wound Status Wound Number: 1 Primary Pressure Ulcer Etiology: Wound Location: Left Calcaneus Wound Open Wounding Event: Pressure Injury Status: Date Acquired: 05/23/2020 Comorbid Coronary Artery Disease, Hypertension, Type II Diabetes, Weeks Of Treatment: 11 History: Osteoarthritis, Neuropathy Clustered Wound: No Photos Wound Measurements Length: (cm) 3.4 Width: (cm) 2.9 Depth: (cm) 1 Area: (cm) 7.744 Volume: (cm) 7.744 % Reduction in Area: 38.4% % Reduction in Volume: -208.2% Epithelialization: Small (1-33%) Tunneling: No Undermining: No Wound Description Classification: Category/Stage IV Wound Margin: Flat and Intact Exudate Amount: Medium Exudate Type: Serosanguineous Exudate Color: red, brown Foul Odor After Cleansing: No Slough/Fibrino Yes Wound Bed Granulation Amount: Medium (34-66%)  Exposed Structure Granulation Quality: Pink Fascia Exposed: No Necrotic Amount: Medium (34-66%) Fat Layer (Subcutaneous Tissue) Exposed: Yes Necrotic Quality: Adherent Slough Tendon Exposed: No Muscle Exposed: No Joint Exposed: No Bone Exposed: Yes Treatment Notes Wound #1 (Calcaneus) Wound Laterality: Left Cleanser Soap and Water Discharge Instruction: May shower and wash wound with dial antibacterial soap and water prior to dressing change. Wound Cleanser Discharge Instruction: Cleanse the wound with wound cleanser prior to applying a clean dressing using gauze sponges, not tissue or cotton balls. Peri-Wound Care Topical Primary Dressing Hydrofera Blue Classic Foam, 4x4 in Discharge Instruction: Moisten with saline prior to applying to wound bed Santyl Ointment Discharge Instruction: Apply nickel thick amount to wound bed as instructed Secondary Dressing Woven Gauze Sponge, Non-Sterile 4x4 in Discharge Instruction: Apply over primary dressing as directed. ALLEVYN Heel 4 1/2in x 5 1/2in / 10.5cm x 13.5cm Discharge Instruction: Apply over primary dressing as directed. Secured With [employer], 4.5x3.1 (in/yd) Discharge Instruction: Secure with Kerlix as directed. 39M Medipore H Soft Cloth Surgical T 4 x 2 (in/yd) ape Discharge Instruction: Secure dressing with tape as directed. Compression Wrap Compression Stockings Add-Ons Electronic Signature(s) Signed: 09/22/2020 5:22:33 PM By: [name] Pilling RN, BSN Entered By: [name] [name] on 09/22/2020 08:39:21 -------------------------------------------------------------------------------- Wound Assessment Details Patient Name: Date of Service: [name] [name] [name] THA N L. 09/22/2020 8:00 A M Medical Record Number: [id number] Patient Account Number: [id number] Date of Birth/Sex: Treating RN: 12-13-1955 (65 y.o. [name] [name], [name] Primary Care Bensyn Bornemann: [name] [name] Other Clinician: Referring Carisma Troupe: Treating  Latanga Nedrow/Extender: [name] [name] in Treatment: 11 Wound Status Wound Number: 2 Primary Diabetic Wound/Ulcer of the Lower Extremity Etiology: Wound Location: Left, Lateral Foot Wound Healed - Epithelialized Wounding Event: Gradually Appeared Status: Date Acquired: 06/21/2020 Comorbid Coronary Artery Disease, Hypertension, Type II Diabetes, Weeks Of Treatment: 11 History: Osteoarthritis, Neuropathy Clustered Wound: No Photos Wound Measurements Length: (cm) Width: (cm) Depth: (cm) Area: (cm) Volume: (cm) 0 % Reduction in Area: 100% 0 % Reduction in Volume: 100% 0 Epithelialization: Large (67-100%) 0 0 Wound Description Classification: Grade 1 Wound Margin: Distinct, outline attached Exudate Amount: None Present Foul Odor After Cleansing: No Slough/Fibrino Yes Wound Bed Granulation Amount: Large (67-100%) Exposed Structure Granulation Quality: Red, Friable Fascia Exposed: No Necrotic Amount: None Present (  0%) Fat Layer (Subcutaneous Tissue) Exposed: No Tendon Exposed: No Muscle Exposed: No Joint Exposed: No Bone Exposed: No Electronic Signature(s) Signed: 09/22/2020 5:22:33 PM By: [name] Pilling RN, BSN Entered By: [name] [name] on 09/22/2020 08:07:21 -------------------------------------------------------------------------------- Wound Assessment Details Patient Name: Date of Service: [name] [name] [name] THA N L. 09/22/2020 8:00 A M Medical Record Number: [id number] Patient Account Number: [id number] Date of Birth/Sex: Treating RN: 1955/01/10 (66 y.o. [name] [name], [name] Primary Care Glenroy Crossen: [name] [name] Other Clinician: Referring Demontae Antunes: Treating Diem Pagnotta/Extender: [name] [name] in Treatment: 11 Wound Status Wound Number: 3 Primary Diabetic Wound/Ulcer of the Lower Extremity Etiology: Wound Location: Right, Lateral Foot Wound Open Wounding Event: Gradually Appeared Status: Date Acquired: 06/21/2020 Comorbid  Coronary Artery Disease, Hypertension, Type II Diabetes, Weeks Of Treatment: 11 History: Osteoarthritis, Neuropathy Clustered Wound: No Photos Wound Measurements Length: (cm) 0.7 Width: (cm) 1 Depth: (cm) 0.1 Area: (cm) 0.55 Volume: (cm) 0.055 % Reduction in Area: 72.5% % Reduction in Volume: 86.3% Epithelialization: Small (1-33%) Tunneling: No Undermining: No Wound Description Classification: Grade 2 Wound Margin: Distinct, outline attached Exudate Amount: Medium Exudate Type: Serosanguineous Exudate Color: red, brown Foul Odor After Cleansing: No Slough/Fibrino Yes Wound Bed Granulation Amount: Large (67-100%) Exposed Structure Granulation Quality: Red, Pink, Friable Fascia Exposed: No Necrotic Amount: Small (1-33%) Fat Layer (Subcutaneous Tissue) Exposed: Yes Necrotic Quality: Adherent Slough Tendon Exposed: No Muscle Exposed: No Joint Exposed: No Bone Exposed: No Treatment Notes Wound #3 (Foot) Wound Laterality: Right, Lateral Cleanser Soap and Water Discharge Instruction: May shower and wash wound with dial antibacterial soap and water prior to dressing change. Wound Cleanser Discharge Instruction: Cleanse the wound with wound cleanser prior to applying a clean dressing using gauze sponges, not tissue or cotton balls. Peri-Wound Care Topical Primary Dressing Hydrofera Blue Classic Foam, 4x4 in Discharge Instruction: Moisten with saline prior to applying to wound bed Secondary Dressing Woven Gauze Sponge, Non-Sterile 4x4 in Discharge Instruction: Apply over primary dressing as directed. Secured With [employer], 4.5x3.1 (in/yd) Discharge Instruction: Secure with Kerlix as directed. 90M Medipore H Soft Cloth Surgical T 4 x 2 (in/yd) ape Discharge Instruction: Secure dressing with tape as directed. Compression Wrap Compression Stockings Add-Ons Electronic Signature(s) Signed: 09/22/2020 5:22:33 PM By: [name] Pilling RN, BSN Entered By: [name] [name] on 09/22/2020 08:07:45 -------------------------------------------------------------------------------- Vitals Details Patient Name: Date of Service: [name] [name] [name] THA N L. 09/22/2020 8:00 A M Medical Record Number: [id number] Patient Account Number: [id number] Date of Birth/Sex: Treating RN: [birth year] (65 y.o. [name] [name] Primary Care Rhenda [place]: [name] [name] Other Clinician: Referring Jasmain Ahlberg: Treating Brnadon Eoff/Extender: [name] [name] in Treatment: 11 Vital Signs Time Taken: 07:50 Temperature (F): 98.3 Height (in): 74 Pulse (bpm): 64 Weight (lbs): 151 Respiratory Rate (breaths/min): 16 Body Mass Index (BMI): 19.4 Blood Pressure (mmHg): 97/60 Reference Range: 80 - 120 mg / dl Electronic Signature(s) Signed: 09/22/2020 5:22:33 PM By: [name] Pilling RN, BSN Entered By: [name] [name] on 09/22/2020 07:53:11

## 2020-09-22 NOTE — Progress Notes (Signed)
[name], [name] ([id number]) Visit Report for 09/22/2020 Debridement Details Patient Name: Date of Service: [name] [name] [facility] THA N L. 09/22/2020 8:00 A M Medical Record Number: [id number] Patient Account Number: [id number] Date of Birth/Sex: Treating RN: [birth year] (65 y.o. [name] Primary Care Provider: [name] Other Clinician: Referring Provider: Treating Provider/Extender: [name] in Treatment: 11 Debridement Performed for Assessment: Wound #1 Left Calcaneus Performed By: Physician Ricard Dillon., MD Debridement Type: Debridement Level of Consciousness (Pre-procedure): Awake and Alert Pre-procedure Verification/Time Out Yes - 08:20 Taken: Start Time: 08:21 Pain Control: Other : Benzocaine 20% T Area Debrided (L x W): otal 3.4 (cm) x 2.9 (cm) = 9.86 (cm) Tissue and other material debrided: Viable, Non-Viable, Slough, Subcutaneous, Skin: Dermis , Skin: Epidermis, Fibrin/Exudate, Slough Level: Skin/Subcutaneous Tissue Debridement Description: Excisional Instrument: Curette Bleeding: Moderate Hemostasis Achieved: Pressure End Time: 08:26 Procedural Pain: 0 Post Procedural Pain: 0 Response to Treatment: Procedure was tolerated well Level of Consciousness (Post- Awake and Alert procedure): Post Debridement Measurements of Total Wound Length: (cm) 3.4 Stage: Category/Stage IV Width: (cm) 2.9 Depth: (cm) 1 Volume: (cm) 7.744 Character of Wound/Ulcer Post Debridement: Improved Post Procedure Diagnosis Same as Pre-procedure Electronic Signature(s) Signed: 09/22/2020 4:58:28 PM By: Linton Ham MD Signed: 09/22/2020 5:22:33 PM By: [name] RN, BSN Entered By: [name] on 09/22/2020 08:29:43 -------------------------------------------------------------------------------- HPI Details Patient Name: Date of Service: [name] [name] [facility] THA N L. 09/22/2020 8:00 A M Medical Record Number: [id number] Patient Account Number:  [id number] Date of Birth/Sex: Treating RN: September 22, 1955 (65 y.o. [name] Primary Care Provider: [name] Other Clinician: Referring Provider: Treating Provider/Extender: [name] in Treatment: 11 History of Present Illness HPI Description: ADMISSION 07/07/2020 This is a 65 year old man who is a type II diabetic. I have reviewed this in epic. It appeared that his diabetes was diagnosed in 2009 at which time he was on metformin. He was followed for a period of time by an endocrinologist with [facility] to always labeled him as a type II diabetic. Recently in epic there is been a switch to type 1 diabetes. If there were defining antibody test done I do not see them and I think the totality of the evidence would suggest that he is a type II diabetic. His recent problem started in May when he fell down 17 stairs. He suffered a scapular fracture he was hospitalized at [facility] for 9 days and then discharged to [facility]. His mother who is present said that most of these wounds seem to develop surrounding the discharge from hospital or the stay at [facility]. He has an area on his lower sacrum which appears currently to be a stage II but per their description this is actually improved quite a bit. He has an area on the left lateral foot at roughly the base of the fifth metatarsal, the left Achilles heel and the right fifth metatarsal head laterally they have been using topical antibiotics. Seen by primary care yesterday and placed on Keflex The patient had arterial studies on 05/05/2020. I wondered whether this had something to do with wounds on his feet at that time but I have not been able to determine this. At that time his ABI on the right was 1.09 with a TBI of 0.65 on the left 0.88 with a TBI of 0.44. Waveforms were triphasic on the right monophasic on the left. He likely has significant PAD on the left Past medical history includes type  2 diabetes poorly controlled  last hemoglobin A1c at 12.7 in May, hypertension, hyperlipidemia, atrial fib on Eliquis, coronary artery disease status post CABG, left scapular fracture in May of this year, AAA repair. Bilateral leg weakness ABIs were not done in our clinic as they were just done in May of this year at [facility]; patient arrives back in clinic he has areas on the left Achilles heel, base of the left fifth metatarsal, right lateral fifth metatarsal. The area he had over the coccyx appears to be healed but this area remains vulnerable. We have been using Iodoflex. I rereviewed his ABIs which were done in May. I do not know exactly what prompted these test however they do show a TBI of 0.44 on the left and monophasic waveforms. 7/21; the patient's wounds are about the same. Certainly no healing and the nurses noted some green drainage on the right fifth metatarsal head, left Achilles a little larger. We have been using silver alginate really without any improvement. We did get an appointment with vascular surgery however its that it did the end of August and they seem to want to repeat the noninvasive test that they have already done in May. I am not sure what the issue here is. We advised the patient's sister to call and point to the idea that they have already had noninvasive studies. The real question I have is does this patient need an angiogram. He is a diabetic and it is possible that the ABIs are falsely elevated because of medial calcification. At least on the left his TBI was abnormal 08/08/2020 upon evaluation today patient appears to be doing about the same in regard to his wounds. He still waiting the appointment with vascular. This is something that hopefully should be undertaken shortly. The 29th of this month is when he is actually scheduled although they have put in a call to move this up if at all possible. Fortunately there does not appear to be any signs of active infection at this time.  No fevers, chills, nausea, vomiting, or diarrhea. 8/18; appointment with Dr. Trula Slade on 8/29. The patient has wounds on the left Achilles heel, left lateral foot and the right lateral fifth metatarsal head. We have been using Santyl under Hydrofera Blue. The surface of especially the left heel looks better to me than what I remember. 9/1; since the patient was last here he underwent angiography by Dr. Trula Slade he also underwent noninvasive studies first showing a ABI of of 0.64 with monophasic waveforms. His TBI was 0.35 with a left great toe pressure of 46 this is about the same pattern as we saw previously. The left was a lot worse than the right. He underwent an angiogram on 08/29/2020. He had multiple 80% peroneal artery stenosis which was his dominant vessel which was treated with angioplasty with no residual stenosis. 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 The anterior tibial artery was also occluded. An attempt was made at recanalization of the anterior tibial artery however this was unsuccessful. Noted that the peroneal artery reconstitutes the posterior tibial artery and the anterior tibial artery creating the plantar arch. The patient has areas on the Achilles left heel left lateral foot and right lateral fifth metatarsal head we have been using Santyl and Hydrofera Blue 9/22; since the patient was last here he went for repeat vascular studies. On the right his ABI was 0.94 with biphasic waveforms TBI of 0.73 with a great toe pressure of 75. On  the left which is the problematic wound his ABI was 1.08 but with biphasic waveforms at the PTA. Biphasic waveforms at the dorsalis pedis his TBI was 0.63 with a great toe pressure of 65. On the left however this does represent a fairly marked improvement. We have been using Santyl and [employer]) Signed: 09/22/2020 4:58:28 PM By: Linton Ham MD Entered By: Linton Ham on 09/22/2020  08:47:22 -------------------------------------------------------------------------------- Physical Exam Details Patient Name: Date of Service: [name] [name] [facility] THA N L. 09/22/2020 8:00 A M Medical Record Number: [id number] Patient Account Number: [id number] Date of Birth/Sex: Treating RN: March 15, 1955 (65 y.o. [name] Primary Care Provider: [name] Other Clinician: Referring Provider: Treating Provider/Extender: [name] in Treatment: 11 Constitutional Patient is hypotensive.. Pulse regular and within target range for patient.Marland Kitchen Respirations regular, non-labored and within target range.. Temperature is normal and within the target range for the patient.Marland Kitchen Appears in no distress. Cardiovascular Faint dorsalis pedis and posterior tibial pulse on the left faint dorsalis pedis pulse on the right I could not feel his posterior tibial. Notes Wound exam; the real problem is the left Achilles heel. Again 50% of this area is completely necrotic. I used a #5 curette again to try and remove this. Hemostasis with direct pressure. The left lateral foot is healed. The right lateral fifth metatarsal head appears very healthy but not much smaller. Electronic Signature(s) Signed: 09/22/2020 4:58:28 PM By: Linton Ham MD Entered By: Linton Ham on 09/22/2020 08:49:48 -------------------------------------------------------------------------------- Physician Orders Details Patient Name: Date of Service: [name] [name] [facility] THA N L. 09/22/2020 8:00 A M Medical Record Number: [id number] Patient Account Number: [id number] Date of Birth/Sex: Treating RN: 06-25-1955 (65 y.o. [name], [name] Primary Care Provider: [name] Other Clinician: Referring Provider: Treating Provider/Extender: [name] in Treatment: 11 Verbal / Phone Orders: No Diagnosis Coding ICD-10 Coding Code Description E11.621 Type 2 diabetes mellitus with foot  ulcer L97.528 Non-pressure chronic ulcer of other part of left foot with other specified severity L97.511 Non-pressure chronic ulcer of other part of right foot limited to breakdown of skin L89.622 Pressure ulcer of left heel, stage 2 E11.42 Type 2 diabetes mellitus with diabetic polyneuropathy E11.51 Type 2 diabetes mellitus with diabetic peripheral angiopathy without gangrene Follow-up Appointments ppointment in 1 week. - Dr. Dellia Nims Return A Patient to go any [facility] outpatient facility for the x-ray of left heel. Bathing/ Shower/ Hygiene May shower and wash wound with soap and water. - with dressing changes only. Edema Control - Lymphedema / SCD / Other Elevate legs to the level of the heart or above for 30 minutes daily and/or when sitting, a frequency of: - 3-4 times a day throughout the day. Off-Loading Turn and reposition every 2 hours Other: - float heels while resting in bed or chair. Use a pillow or rolled sheet under legs to offload the heels. Home Health New wound care orders this week; continue Home Health for wound care. May utilize formulary equivalent dressing for wound treatment orders unless otherwise specified. Other Home Health Orders/Instructions: - [facility] home health three times a week and family to change all other days to left heel. right lateral foot change three times a week. right foot discontinue using santyl. Wound Treatment Wound #1 - Calcaneus Wound Laterality: Left Cleanser: Soap and Water (Home Health) 1 x Per Day/30 Days Discharge Instructions: May shower and wash wound with dial antibacterial soap and water prior to dressing change. Cleanser: Wound  Cleanser (Home Health) 1 x Per Day/30 Days Discharge Instructions: Cleanse the wound with wound cleanser prior to applying a clean dressing using gauze sponges, not tissue or cotton balls. Prim Dressing: Hydrofera Blue Classic Foam, 4x4 in (Home Health) 1 x Per Day/30 Days ary Discharge Instructions:  Moisten with saline prior to applying to wound bed Prim Dressing: Santyl Ointment ([place]) 1 x Per Day/30 Days ary Discharge Instructions: Apply nickel thick amount to wound bed as instructed Secondary Dressing: Woven Gauze Sponge, Non-Sterile 4x4 in (Home Health) 1 x Per Day/30 Days Discharge Instructions: Apply over primary dressing as directed. Secondary Dressing: ALLEVYN Heel 4 1/2in x 5 1/2in / 10.5cm x 13.5cm (Home Health) 1 x Per Day/30 Days Discharge Instructions: Apply over primary dressing as directed. Secured With: [employer], 4.5x3.1 (in/yd) (Home Health) 1 x Per Day/30 Days Discharge Instructions: Secure with Kerlix as directed. Secured With: 41M Medipore H Soft Cloth Surgical T 4 x 2 (in/yd) (Home Health) 1 x Per Day/30 Days ape Discharge Instructions: Secure dressing with tape as directed. Wound #3 - Foot Wound Laterality: Right, Lateral Cleanser: Soap and Water (Home Health) 3 x Per Week/30 Days Discharge Instructions: May shower and wash wound with dial antibacterial soap and water prior to dressing change. Cleanser: Wound Cleanser (Home Health) 3 x Per Week/30 Days Discharge Instructions: Cleanse the wound with wound cleanser prior to applying a clean dressing using gauze sponges, not tissue or cotton balls. Prim Dressing: Hydrofera Blue Classic Foam, 4x4 in (Home Health) 3 x Per Week/30 Days ary Discharge Instructions: Moisten with saline prior to applying to wound bed Secondary Dressing: Woven Gauze Sponge, Non-Sterile 4x4 in (Home Health) 3 x Per Week/30 Days Discharge Instructions: Apply over primary dressing as directed. Secured With: [employer], 4.5x3.1 (in/yd) (Home Health) 3 x Per Week/30 Days Discharge Instructions: Secure with Kerlix as directed. Secured With: 41M Medipore H Soft Cloth Surgical T 4 x 2 (in/yd) (Home Health) 3 x Per Week/30 Days ape Discharge Instructions: Secure dressing with tape as directed. Radiology X-ray, left heel -  x-ray of left heel related no non-healing wound looking for infection. CPT code - (ICD10 E11.621 - Type 2 diabetes mellitus with foot ulcer) Electronic Signature(s) Signed: 09/22/2020 4:58:28 PM By: Linton Ham MD Signed: 09/22/2020 5:22:33 PM By: [name] RN, BSN Entered By: [name] on 09/22/2020 08:35:46 Prescription 09/22/2020 -------------------------------------------------------------------------------- Jasper Riling MD Patient Name: Provider: [birth year] [id number] Date of Birth: NPI#: Jerilynn Mages [id number] Sex: DEA #: [phone] [id number] Phone #: License #: [place] Patient Address: [address] [place], [place] [address] [place], [place] [address] [phone] Allergies No Known Allergies Provider's Orders X-ray, left heel - ICD10: E11.621 - x-ray of left heel related no non-healing wound looking for infection. CPT code Hand Signature: Date(s): Electronic Signature(s) Signed: 09/22/2020 4:58:28 PM By: Linton Ham MD Signed: 09/22/2020 5:22:33 PM By: [name] RN, BSN Entered By: [name] on 09/22/2020 08:35:47 -------------------------------------------------------------------------------- Problem List Details Patient Name: Date of Service: [name] [name] [facility] THA N L. 09/22/2020 8:00 A M Medical Record Number: [id number] Patient Account Number: [id number] Date of Birth/Sex: Treating RN: 08/03/1955 (65 y.o. [name] Primary Care Provider: [name] Other Clinician: Referring Provider: Treating Provider/Extender: [name] in Treatment: 11 Active Problems ICD-10 Encounter Code Description Active Date MDM Diagnosis E11.621 Type 2 diabetes mellitus with foot ulcer 07/07/2020 No Yes L97.528  Non-pressure chronic ulcer of other part of left foot with other specified 07/07/2020 No Yes severity L97.511 Non-pressure  chronic ulcer of other part of right foot limited to breakdown of 07/07/2020 No Yes skin L89.622 Pressure ulcer of left heel, stage 2 07/07/2020 No Yes E11.51 Type 2 diabetes mellitus with diabetic peripheral angiopathy without gangrene 09/01/2020 No Yes E11.42 Type 2 diabetes mellitus with diabetic polyneuropathy 07/07/2020 No Yes Inactive Problems ICD-10 Code Description Active Date Inactive Date L89.152 Pressure ulcer of sacral region, stage 2 07/07/2020 07/07/2020 Resolved Problems Electronic Signature(s) Signed: 09/22/2020 4:58:28 PM By: Linton Ham MD Entered By: Linton Ham on 09/22/2020 08:45:12 -------------------------------------------------------------------------------- Progress Note Details Patient Name: Date of Service: [name] [name] [facility] THA N L. 09/22/2020 8:00 A M Medical Record Number: [id number] Patient Account Number: [id number] Date of Birth/Sex: Treating RN: [birth year] (65 y.o. [name] Primary Care Provider: [name] Other Clinician: Referring Provider: Treating Provider/Extender: [name] in Treatment: 11 Subjective History of Present Illness (HPI) ADMISSION 07/07/2020 This is a 65 year old man who is a type II diabetic. I have reviewed this in epic. It appeared that his diabetes was diagnosed in 2009 at which time he was on metformin. He was followed for a period of time by an endocrinologist with [facility] to always labeled him as a type II diabetic. Recently in epic there is been a switch to type 1 diabetes. If there were defining antibody test done I do not see them and I think the totality of the evidence would suggest that he is a type II diabetic. His recent problem started in May when he fell down 17 stairs. He suffered a scapular fracture he was hospitalized at [facility] for 9 days and then discharged to [facility]. His mother who is present said that most of these wounds seem to develop surrounding the discharge from hospital  or the stay at [facility]. He has an area on his lower sacrum which appears currently to be a stage II but per their description this is actually improved quite a bit. He has an area on the left lateral foot at roughly the base of the fifth metatarsal, the left Achilles heel and the right fifth metatarsal head laterally they have been using topical antibiotics. Seen by primary care yesterday and placed on Keflex The patient had arterial studies on 05/05/2020. I wondered whether this had something to do with wounds on his feet at that time but I have not been able to determine this. At that time his ABI on the right was 1.09 with a TBI of 0.65 on the left 0.88 with a TBI of 0.44. Waveforms were triphasic on the right monophasic on the left. He likely has significant PAD on the left Past medical history includes type 2 diabetes poorly controlled last hemoglobin A1c at 12.7 in May, hypertension, hyperlipidemia, atrial fib on Eliquis, coronary artery disease status post CABG, left scapular fracture in May of this year, AAA repair. Bilateral leg weakness ABIs were not done in our clinic as they were just done in May of this year at [facility] 7/14; patient arrives back in clinic he has areas on the left Achilles heel, base of the left fifth metatarsal, right lateral fifth metatarsal. The area he had over the coccyx appears to be healed but this area remains vulnerable. We have been using Iodoflex. I rereviewed his ABIs which were done in May. I do not know exactly what prompted these test however they do  show a TBI of 0.44 on the left and monophasic waveforms. 7/21; the patient's wounds are about the same. Certainly no healing and the nurses noted some green drainage on the right fifth metatarsal head, left Achilles a little larger. We have been using silver alginate really without any improvement. We did get an appointment with vascular surgery however its that it did the end of August and they  seem to want to repeat the noninvasive test that they have already done in May. I am not sure what the issue here is. We advised the patient's sister to call and point to the idea that they have already had noninvasive studies. The real question I have is does this patient need an angiogram. He is a diabetic and it is possible that the ABIs are falsely elevated because of medial calcification. At least on the left his TBI was abnormal 08/08/2020 upon evaluation today patient appears to be doing about the same in regard to his wounds. He still waiting the appointment with vascular. This is something that hopefully should be undertaken shortly. The 29th of this month is when he is actually scheduled although they have put in a call to move this up if at all possible. Fortunately there does not appear to be any signs of active infection at this time. No fevers, chills, nausea, vomiting, or diarrhea. 8/18; appointment with Dr. Trula Slade on 8/29. The patient has wounds on the left Achilles heel, left lateral foot and the right lateral fifth metatarsal head. We have been using Santyl under Hydrofera Blue. The surface of especially the left heel looks better to me than what I remember. 9/1; since the patient was last here he underwent angiography by Dr. Trula Slade he also underwent noninvasive studies first showing a ABI of of 0.64 with monophasic waveforms. His TBI was 0.35 with a left great toe pressure of 46 this is about the same pattern as we saw previously. The left was a lot worse than the right. He underwent an angiogram on 08/29/2020. He had multiple 80% peroneal artery stenosis which was his dominant vessel which was treated with angioplasty with no residual stenosis. 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 The anterior tibial artery was also occluded. An attempt was made at recanalization of the anterior tibial artery however this was unsuccessful. Noted that the peroneal artery reconstitutes the  posterior tibial artery and the anterior tibial artery creating the plantar arch. The patient has areas on the Achilles left heel left lateral foot and right lateral fifth metatarsal head we have been using Santyl and Hydrofera Blue 9/22; since the patient was last here he went for repeat vascular studies. On the right his ABI was 0.94 with biphasic waveforms TBI of 0.73 with a great toe pressure of 75. On the left which is the problematic wound his ABI was 1.08 but with biphasic waveforms at the PTA. Biphasic waveforms at the dorsalis pedis his TBI was 0.63 with a great toe pressure of 65. On the left however this does represent a fairly marked improvement. We have been using Santyl and Hydrofera Blue Objective Constitutional Patient is hypotensive.. Pulse regular and within target range for patient.Marland Kitchen Respirations regular, non-labored and within target range.. Temperature is normal and within the target range for the patient.Marland Kitchen Appears in no distress. Vitals Time Taken: 7:50 AM, Height: 74 in, Weight: 151 lbs, BMI: 19.4, Temperature: 98.3 F, Pulse: 64 bpm, Respiratory Rate: 16 breaths/min, Blood Pressure: 97/60 mmHg. Cardiovascular Faint dorsalis pedis and posterior tibial  pulse on the left faint dorsalis pedis pulse on the right I could not feel his posterior tibial. General Notes: Wound exam; the real problem is the left Achilles heel. Again 50% of this area is completely necrotic. I used a #5 curette again to try and remove this. Hemostasis with direct pressure. The left lateral foot is healed. The right lateral fifth metatarsal head appears very healthy but not much smaller. Integumentary (Hair, Skin) Wound #1 status is Open. Original cause of wound was Pressure Injury. The date acquired was: 05/23/2020. The wound has been in treatment 11 weeks. The wound is located on the Left Calcaneus. The wound measures 3.4cm length x 2.9cm width x 1cm depth; 7.744cm^2 area and 7.744cm^3 volume. There is  bone and Fat Layer (Subcutaneous Tissue) exposed. There is no tunneling or undermining noted. There is a medium amount of serosanguineous drainage noted. The wound margin is flat and intact. There is medium (34-66%) pink granulation within the wound bed. There is a medium (34-66%) amount of necrotic tissue within the wound bed including Adherent Slough. Wound #2 status is Healed - Epithelialized. Original cause of wound was Gradually Appeared. The date acquired was: 06/21/2020. The wound has been in treatment 11 weeks. The wound is located on the Left,Lateral Foot. The wound measures 0cm length x 0cm width x 0cm depth; 0cm^2 area and 0cm^3 volume. There is a none present amount of drainage noted. The wound margin is distinct with the outline attached to the wound base. There is large (67-100%) red, friable granulation within the wound bed. There is no necrotic tissue within the wound bed. Wound #3 status is Open. Original cause of wound was Gradually Appeared. The date acquired was: 06/21/2020. The wound has been in treatment 11 weeks. The wound is located on the Right,Lateral Foot. The wound measures 0.7cm length x 1cm width x 0.1cm depth; 0.55cm^2 area and 0.055cm^3 volume. There is Fat Layer (Subcutaneous Tissue) exposed. There is no tunneling or undermining noted. There is a medium amount of serosanguineous drainage noted. The wound margin is distinct with the outline attached to the wound base. There is large (67-100%) red, pink, friable granulation within the wound bed. There is a small (1- 33%) amount of necrotic tissue within the wound bed including Adherent Slough. Assessment Active Problems ICD-10 Type 2 diabetes mellitus with foot ulcer Non-pressure chronic ulcer of other part of left foot with other specified severity Non-pressure chronic ulcer of other part of right foot limited to breakdown of skin Pressure ulcer of left heel, stage 2 Type 2 diabetes mellitus with diabetic peripheral  angiopathy without gangrene Type 2 diabetes mellitus with diabetic polyneuropathy Procedures Wound #1 Pre-procedure diagnosis of Wound #1 is a Pressure Ulcer located on the Left Calcaneus . There was a Excisional Skin/Subcutaneous Tissue Debridement with a total area of 9.86 sq cm performed by Ricard Dillon., MD. With the following instrument(s): Curette to remove Viable and Non-Viable tissue/material. Material removed includes Subcutaneous Tissue, Slough, Skin: Dermis, Skin: Epidermis, and Fibrin/Exudate after achieving pain control using Other (Benzocaine 20%). A time out was conducted at 08:20, prior to the start of the procedure. A Moderate amount of bleeding was controlled with Pressure. The procedure was tolerated well with a pain level of 0 throughout and a pain level of 0 following the procedure. Post Debridement Measurements: 3.4cm length x 2.9cm width x 1cm depth; 7.744cm^3 volume. Post debridement Stage noted as Category/Stage IV. Character of Wound/Ulcer Post Debridement is improved. Post procedure Diagnosis Wound #1: Same  as Pre-Procedure Plan Follow-up Appointments: Return Appointment in 1 week. - Dr. Dellia Nims Patient to go any [facility] outpatient facility for the x-ray of left heel. Bathing/ Shower/ Hygiene: May shower and wash wound with soap and water. - with dressing changes only. Edema Control - Lymphedema / SCD / Other: Elevate legs to the level of the heart or above for 30 minutes daily and/or when sitting, a frequency of: - 3-4 times a day throughout the day. Off-Loading: Turn and reposition every 2 hours Other: - float heels while resting in bed or chair. Use a pillow or rolled sheet under legs to offload the heels. Home Health: New wound care orders this week; continue Home Health for wound care. May utilize formulary equivalent dressing for wound treatment orders unless otherwise specified. Other Home Health Orders/Instructions: - [facility] home health three  times a week and family to change all other days to left heel. right lateral foot change three times a week. right foot discontinue using santyl. Radiology ordered were: X-ray, left heel - x-ray of left heel related no non-healing wound looking for infection. CPT code WOUND #1: - Calcaneus Wound Laterality: Left Cleanser: Soap and Water (Home Health) 1 x Per Day/30 Days Discharge Instructions: May shower and wash wound with dial antibacterial soap and water prior to dressing change. Cleanser: Wound Cleanser (Home Health) 1 x Per Day/30 Days Discharge Instructions: Cleanse the wound with wound cleanser prior to applying a clean dressing using gauze sponges, not tissue or cotton balls. Prim Dressing: Hydrofera Blue Classic Foam, 4x4 in (Home Health) 1 x Per Day/30 Days ary Discharge Instructions: Moisten with saline prior to applying to wound bed Prim Dressing: Santyl Ointment ([place]) 1 x Per Day/30 Days ary Discharge Instructions: Apply nickel thick amount to wound bed as instructed Secondary Dressing: Woven Gauze Sponge, Non-Sterile 4x4 in (Home Health) 1 x Per Day/30 Days Discharge Instructions: Apply over primary dressing as directed. Secondary Dressing: ALLEVYN Heel 4 1/2in x 5 1/2in / 10.5cm x 13.5cm (Home Health) 1 x Per Day/30 Days Discharge Instructions: Apply over primary dressing as directed. Secured With: [employer], 4.5x3.1 (in/yd) (Home Health) 1 x Per Day/30 Days Discharge Instructions: Secure with Kerlix as directed. Secured With: 54M Medipore H Soft Cloth Surgical T 4 x 2 (in/yd) (Home Health) 1 x Per Day/30 Days ape Discharge Instructions: Secure dressing with tape as directed. WOUND #3: - Foot Wound Laterality: Right, Lateral Cleanser: Soap and Water (Home Health) 3 x Per Week/30 Days Discharge Instructions: May shower and wash wound with dial antibacterial soap and water prior to dressing change. Cleanser: Wound Cleanser (Home Health) 3 x Per Week/30  Days Discharge Instructions: Cleanse the wound with wound cleanser prior to applying a clean dressing using gauze sponges, not tissue or cotton balls. Prim Dressing: Hydrofera Blue Classic Foam, 4x4 in (Home Health) 3 x Per Week/30 Days ary Discharge Instructions: Moisten with saline prior to applying to wound bed Secondary Dressing: Woven Gauze Sponge, Non-Sterile 4x4 in (Home Health) 3 x Per Week/30 Days Discharge Instructions: Apply over primary dressing as directed. Secured With: [employer], 4.5x3.1 (in/yd) (Home Health) 3 x Per Week/30 Days Discharge Instructions: Secure with Kerlix as directed. Secured With: 54M Medipore H Soft Cloth Surgical T 4 x 2 (in/yd) (Home Health) 3 x Per Week/30 Days ape Discharge Instructions: Secure dressing with tape as directed. 1. I continue with the Santyl and Hydrofera Blue on the left heel. There is a divot in this that goes precariously  close to bone. I have therefore ordered an x-ray 2. MolecuLight soda slight amount of blush fluorescence anteriorly. I included this area and my debridement 3. X-ray of the left heel 4. On the right I think your Hydrofera Blue is in order. I would like to see a better surface area next week or I will likely change the dressing. 5. His improvement in vascular markers on the left there is encouraging 6. Follow-up next week MolecuLight DX: 1st Scanned Wound Fluorescence bacterial imaging was medically necessary today due to Initial Evaluation of the wound with MolecuLightDX to determine baseline (Indication): bacterial bioburden level MolecuLight Results Red Colors The indicated colors were noted in the following area(s). In the periphery of the wound As a result of todays scan, the following treatment plans were put in place. Debridement MolecuLight Procedure The MolecularLight DX device was cleaned with a disinfectant wipe prior to use., The correct patient profile was confirmed and correct wound  was verified., Range finder sensor used to ensure appropriate distance selected The following was completed: between imaging unit and wound bed, Room lights were turned off and the ambient light sensor was checked., Blue circle appeared around the lightbulb., The fluorescence icon was selected. Screen was tapped to enhance focus and the image was captured. Additional drapes were used to ensure adequate darknesso No Potential ICD-10 Codes ICD-10 A49.9 Bacterial infection unspecified (Red/Blush/Yellow Color) Additional Scanned Wounds Did you scan any additional Woundso No Electronic Signature(s) Signed: 09/22/2020 4:58:28 PM By: Linton Ham MD Entered By: Linton Ham on 09/22/2020 08:51:57 -------------------------------------------------------------------------------- SuperBill Details Patient Name: Date of Service: [name] [name] HNA THA N L. 09/22/2020 Medical Record Number: [id number] Patient Account Number: [id number] Date of Birth/Sex: Treating RN: 08/16/1955 (65 y.o. [name], [name] Primary Care Provider: [name] Other Clinician: Referring Provider: Treating Provider/Extender: [name] in Treatment: 11 Diagnosis Coding ICD-10 Codes Code Description E11.621 Type 2 diabetes mellitus with foot ulcer L97.528 Non-pressure chronic ulcer of other part of left foot with other specified severity L97.511 Non-pressure chronic ulcer of other part of right foot limited to breakdown of skin L89.622 Pressure ulcer of left heel, stage 2 E11.42 Type 2 diabetes mellitus with diabetic polyneuropathy E11.51 Type 2 diabetes mellitus with diabetic peripheral angiopathy without gangrene Facility Procedures CPT4 Code: 16073710 Description: [place] - DEB SUBQ TISSUE 20 SQ CM/< ICD-10 Diagnosis Description L89.622 Pressure ulcer of left heel, stage 2 Modifier: Quantity: 1 CPT4 Code: [id number] Description: 6270J NONCNTACT RT FLORO WND 1ST STE ICD-10 Diagnosis  Description L89.622 Pressure ulcer of left heel, stage 2 Modifier: 59 Quantity: 1 Physician Procedures : CPT4 Code Description Modifier [id number] - WC PHYS SUBQ TISS 20 SQ CM ICD-10 Diagnosis Description L89.622 Pressure ulcer of left heel, stage 2 Quantity: 1 : [id number] NONCNTACT RT [place]O WND 1ST STE 59 ICD-10 Diagnosis Description L89.622 Pressure ulcer of left heel, stage 2 Quantity: 1 Electronic Signature(s) Signed: 09/22/2020 4:58:28 PM By: Linton Ham MD Entered By: Linton Ham on 09/22/2020 08:52:25

## 2020-09-26 ENCOUNTER — Encounter: Payer: HMO | Admitting: Surgery

## 2020-09-27 ENCOUNTER — Other Ambulatory Visit (HOSPITAL_BASED_OUTPATIENT_CLINIC_OR_DEPARTMENT_OTHER): Payer: Self-pay | Admitting: Internal Medicine

## 2020-09-27 ENCOUNTER — Other Ambulatory Visit: Payer: Self-pay

## 2020-09-27 ENCOUNTER — Ambulatory Visit (HOSPITAL_BASED_OUTPATIENT_CLINIC_OR_DEPARTMENT_OTHER)
Admission: RE | Admit: 2020-09-27 | Discharge: 2020-09-27 | Disposition: A | Discharge status: Home / Self Care | Payer: HMO | Source: Ambulatory Visit | Attending: Internal Medicine | Admitting: Internal Medicine

## 2020-09-27 DIAGNOSIS — S91302A Unspecified open wound, left foot, initial encounter: Secondary | ICD-10-CM

## 2020-09-27 DIAGNOSIS — L97519 Non-pressure chronic ulcer of other part of right foot with unspecified severity: Secondary | ICD-10-CM | POA: Diagnosis not present

## 2020-09-27 DIAGNOSIS — L97529 Non-pressure chronic ulcer of other part of left foot with unspecified severity: Secondary | ICD-10-CM | POA: Diagnosis not present

## 2020-09-27 DIAGNOSIS — L89154 Pressure ulcer of sacral region, stage 4: Secondary | ICD-10-CM | POA: Diagnosis not present

## 2020-09-27 DIAGNOSIS — L97429 Non-pressure chronic ulcer of left heel and midfoot with unspecified severity: Secondary | ICD-10-CM | POA: Diagnosis not present

## 2020-09-28 ENCOUNTER — Encounter: Payer: Self-pay | Admitting: Podiatrist

## 2020-09-28 ENCOUNTER — Ambulatory Visit: Payer: HMO | Admitting: Podiatrist

## 2020-09-28 DIAGNOSIS — M79675 Pain in left toe(s): Secondary | ICD-10-CM | POA: Diagnosis not present

## 2020-09-28 DIAGNOSIS — M79674 Pain in right toe(s): Secondary | ICD-10-CM | POA: Diagnosis not present

## 2020-09-28 DIAGNOSIS — B351 Tinea unguium: Secondary | ICD-10-CM | POA: Diagnosis not present

## 2020-09-28 NOTE — Progress Notes (Signed)
Chief Complaint  Patient presents with   Nail Problem    Routine foot care Nail trim 1-5 bilateral    Dressing Change    Pt report wound on left heel, states he is getting it treated by wound center, aide come out 2 times a week, and they go once a week to the wound center.      HPI: Patient is 65 y.o. male with diabetes who presents today for elongated and thickened toenails 1 through 5 bilateral feet.  The patient is unable to trim them himself due to the thickness.  They are painful with shoes and with pressure.  He has a wound on the left heel and a wound on the right foot which have dressings on them.  He is being seen at the wound care center for these areas.  He denies any systemic signs of infection.  Denies any problems or concerns other than those listed.  Patient Active Problem List   Diagnosis Date Noted   Iron deficiency anemia secondary to inadequate dietary iron intake 07/14/2020   Diabetic ulcer of left foot associated with type 1 diabetes mellitus ([place]) 06/30/2020   Pressure injury of skin 05/24/2020   BPH with obstruction/lower urinary tract symptoms 03/16/2020   PAF (paroxysmal atrial fibrillation) ([place]) 03/16/2020   Type 1 diabetes mellitus with diabetic foot infection ([place]) 01/27/2020   Encounter for general adult medical examination with abnormal findings 01/13/2020   Hyperlipidemia LDL goal <70 01/13/2020   Benign prostatic hyperplasia without lower urinary tract symptoms 01/13/2020   PVD (peripheral vascular disease) ([place]) 01/13/2020   Coronary artery disease involving bypass graft of transplanted heart without angina pectoris 01/13/2020   Paroxysmal atrial fibrillation ([place]) 01/13/2020   Recurrent major depressive disorder, in partial remission ([place]) 01/13/2020   Aortic dissection ([place]) 02/05/2019   Coronary artery disease 02/05/2019   Hypertension 02/05/2019   Psoriasis 02/05/2019   Vitamin D deficiency 01/07/2019   Uncontrolled type 2 diabetes mellitus with  hyperglycemia ([place]) 01/07/2019   Microalbuminuria 01/07/2019   Depression 06/10/2018   Patellar tendonitis of right knee 04/24/2018   Peyronie's disease 06/09/2015   Erectile dysfunction 03/15/2015   S/P aortic aneurysm repair 05/19/2012   S/P CABG (coronary artery bypass graft) 05/19/2012   Non-proliferative diabetic retinopathy ([place]) 02/27/2011   Dupuytren's contracture of both hands 11/01/2010    Current Outpatient Medications on File Prior to Visit  Medication Sig Dispense Refill   apixaban (ELIQUIS) 5 MG TABS tablet Take 1 tablet (5 mg total) by mouth 2 (two) times daily. 60 tablet 2   aspirin EC 325 MG tablet Take 325 mg by mouth daily.     atorvastatin (LIPITOR) 40 MG tablet Take 1 tablet (40 mg total) by mouth daily. 90 tablet 1   B Complex Vitamins (B COMPLEX-B12 PO) Take 1 tablet by mouth daily.      Cholecalciferol (VITAMIN D) 125 MCG (5000 UT) CAPS Take 5,000 Units by mouth daily.      clopidogrel (PLAVIX) 75 MG tablet Take 1 tablet (75 mg total) by mouth daily. 30 tablet 11   Continuous Blood Gluc Sensor (DEXCOM G6 SENSOR) MISC 1 Device by Does not apply route See admin instructions. Change every 10 days 9 each 3   Insulin Aspart FlexPen 100 UNIT/ML SOPN Inject 8 Units into the skin 2 (two) times daily with a meal. And pen needles 3/day 15 mL 3   Insulin Disposable Pump (OMNIPOD 5 G6 INTRO, GEN 5,) KIT 1 Device by Does not apply  route every 3 (three) days. 30 kit 3   insulin glargine (LANTUS SOLOSTAR) 100 UNIT/ML Solostar Pen Inject 20 Units into the skin every morning. 30 mL PRN   Insulin Pen Needle (DROPLET PEN NEEDLES) 32G X 4 MM MISC Use daily for glucose control 100 each 5   lisinopril (ZESTRIL) 5 MG tablet TAKE 1 TABLET BY MOUTH DAILY (Patient not taking: No sig reported) 90 tablet 1   mirtazapine (REMERON) 15 MG tablet Take 1 tablet (15 mg total) by mouth at bedtime. 90 tablet 1   PARoxetine (PAXIL) 20 MG tablet Take 1 tablet (20 mg total) by mouth daily. 90 tablet 1    SANTYL ointment Apply 1 application topically daily.     sulfamethoxazole-trimethoprim (BACTRIM DS) 800-160 MG tablet Take 1 tablet by mouth 2 (two) times daily. 28 tablet 0   traZODone (DESYREL) 50 MG tablet Take 1 tablet (50 mg total) by mouth at bedtime. 90 tablet 1   No current facility-administered medications on file prior to visit.    No Known Allergies  Review of Systems No fevers, chills, nausea, muscle aches, no difficulty breathing, no calf pain, no chest pain or shortness of breath.   Physical Exam  GENERAL APPEARANCE: Alert, conversant. Appropriately groomed. No acute distress.   VASCULAR: Pedal pulses palpable DP and unable to palpate left due to the bandage and faintly palpable right PT pulse.  Capillary refill time is immediate to all digits,  Proximal to distal cooling it warm to warm.  Digital perfusion adequate.   NEUROLOGIC: sensation is decreased to 5.07 monofilament at 3/5 sites bilateral.  Light touch is intact bilateral, vibratory sensation absent bilateral  MUSCULOSKELETAL: acceptable muscle strength, tone and stability bilateral.  No gross boney pedal deformities noted.    DERMATOLOGIC: skin is warm, supple, and dry.  Bandage present to the left ankle and heel and right submetatarsal 5.  Patient's digital nails are elongated, thickened, discolored, dystrophic and clinically mycotic they are painful with direct dorsal plantar pressure.  No area of ingrowing nail noted.  Assessment   1. Pain due to onychomycosis of toenails of both feet      Plan  Debridement of toenails was recommended.  Onychoreduction of symptomatic toenails was performed via nail nipper and power burr without iatrogenic incident.  Patient was instructed on signs and symptoms of infection and was told to call immediately should any of these arise.  Recommended follow up in 3 months or instructed to call sooner if any pedal concerns arise.  Dressings applied by the wound care center were  left untouched at this visit.  He will follow up with the wound care center for his regularly scheduled appointments.

## 2020-09-28 NOTE — Patient Instructions (Signed)
Diabetes Mellitus and Foot Care Foot care is an important part of your health, especially when you have diabetes. Diabetes may cause you to have problems because of poor blood flow (circulation) to your feet and legs, which can cause your skin to: Become thinner and drier. Break more easily. Heal more slowly. Peel and crack. You may also have nerve damage (neuropathy) in your legs and feet, causing decreased feeling in them. This means that you may not notice minor injuries to your feet that could lead to more serious problems. Noticing and addressing any potential problems early is the best way to prevent future foot problems. How to care for your feet Foot hygiene Wash your feet daily with warm water and mild soap. Do not use hot water. Then, pat your feet and the areas between your toes until they are completely dry. Do not soak your feet as this can dry your skin. Apply a moisturizing lotion or petroleum jelly to the skin on your feet and to dry, brittle toenails. Use lotion that does not contain alcohol and is unscented. Do not apply lotion between your toes. Shoes and socks Wear clean socks or stockings every day. Make sure they are not too tight. Do not wear knee-high stockings since they may decrease blood flow to your legs. Wear shoes that fit properly and have enough cushioning. Always look in your shoes before you put them on to be sure there are no objects inside. To break in new shoes, wear them for just a few hours a day. This prevents injuries on your feet. Wounds, scrapes, corns, and calluses  Check your feet daily for blisters, cuts, bruises, sores, and redness. If you cannot see the bottom of your feet, use a mirror or ask someone for help. Do not cut corns or calluses or try to remove them with medicine. If you find a minor scrape, cut, or break in the skin on your feet, keep it and the skin around it clean and dry. You may clean these areas with mild soap and water. Do not  clean the area with peroxide, alcohol, or iodine. If you have a wound, scrape, corn, or callus on your foot, look at it several times a day to make sure it is healing and not infected. Check for: Redness, swelling, or pain. Fluid or blood. Warmth. Pus or a bad smell. General tips Do not cross your legs. This may decrease blood flow to your feet. Do not use heating pads or hot water bottles on your feet. They may burn your skin. If you have lost feeling in your feet or legs, you may not know this is happening until it is too late. Protect your feet from hot and cold by wearing shoes, such as at the beach or on hot pavement. Schedule a complete foot exam at least once a year (annually) or more often if you have foot problems. Report any cuts, sores, or bruises to your health care provider immediately. Where to find more information American Diabetes Association: www.diabetes.org Association of Diabetes Care & Education Specialists: www.diabeteseducator.org Contact a health care provider if: You have a medical condition that increases your risk of infection and you have any cuts, sores, or bruises on your feet. You have an injury that is not healing. You have redness on your legs or feet. You feel burning or tingling in your legs or feet. You have pain or cramps in your legs and feet. Your legs or feet are numb. Your feet always   feel cold. You have pain around any toenails. Get help right away if: You have a wound, scrape, corn, or callus on your foot and: You have pain, swelling, or redness that gets worse. You have fluid or blood coming from the wound, scrape, corn, or callus. Your wound, scrape, corn, or callus feels warm to the touch. You have pus or a bad smell coming from the wound, scrape, corn, or callus. You have a fever. You have a red line going up your leg. Summary Check your feet every day for blisters, cuts, bruises, sores, and redness. Apply a moisturizing lotion or  petroleum jelly to the skin on your feet and to dry, brittle toenails. Wear shoes that fit properly and have enough cushioning. If you have foot problems, report any cuts, sores, or bruises to your health care provider immediately. Schedule a complete foot exam at least once a year (annually) or more often if you have foot problems. This information is not intended to replace advice given to you by your health care provider. Make sure you discuss any questions you have with your health care provider. Document Revised: 07/09/2019 Document Reviewed: 07/09/2019 Elsevier Patient Education  2022 [employer]  

## 2020-09-29 ENCOUNTER — Other Ambulatory Visit: Payer: Self-pay

## 2020-09-29 ENCOUNTER — Encounter (HOSPITAL_BASED_OUTPATIENT_CLINIC_OR_DEPARTMENT_OTHER): Payer: HMO | Admitting: Internal Medicine

## 2020-09-29 DIAGNOSIS — E11621 Type 2 diabetes mellitus with foot ulcer: Secondary | ICD-10-CM | POA: Diagnosis not present

## 2020-09-29 DIAGNOSIS — L97512 Non-pressure chronic ulcer of other part of right foot with fat layer exposed: Secondary | ICD-10-CM | POA: Diagnosis not present

## 2020-09-29 DIAGNOSIS — L89620 Pressure ulcer of left heel, unstageable: Secondary | ICD-10-CM | POA: Diagnosis not present

## 2020-09-30 NOTE — Progress Notes (Signed)
DAXTER, PAULE ([id number]) Visit Report for 09/29/2020 HPI Details Patient Name: Date of Service: [name] [name] [facility] THA N L. 09/29/2020 8:00 A M Medical Record Number: [id number] Patient Account Number: [id number] Date of Birth/Sex: Treating RN: March 27, 1955 (65 y.o. [name] Primary Care Provider: [name] Other Clinician: Referring Provider: Treating Provider/Extender: [name] in Treatment: 12 History of Present Illness HPI Description: ADMISSION 07/07/2020 This is a 65 year old man who is a type II diabetic. I have reviewed this in epic. It appeared that his diabetes was diagnosed in 2009 at which time he was on metformin. He was followed for a period of time by an endocrinologist with [facility] to always labeled him as a type II diabetic. Recently in epic there is been a switch to type 1 diabetes. If there were defining antibody test done I do not see them and I think the totality of the evidence would suggest that he is a type II diabetic. His recent problem started in May when he fell down 17 stairs. He suffered a scapular fracture he was hospitalized at [facility] for 9 days and then discharged to [facility]. His mother who is present said that most of these wounds seem to develop surrounding the discharge from hospital or the stay at [facility]. He has an area on his lower sacrum which appears currently to be a stage II but per their description this is actually improved quite a bit. He has an area on the left lateral foot at roughly the base of the fifth metatarsal, the left Achilles heel and the right fifth metatarsal head laterally they have been using topical antibiotics. Seen by primary care yesterday and placed on Keflex The patient had arterial studies on 05/05/2020. I wondered whether this had something to do with wounds on his feet at that time but I have not been able to determine this. At that time his ABI on the right was 1.09 with a TBI of  0.65 on the left 0.88 with a TBI of 0.44. Waveforms were triphasic on the right monophasic on the left. He likely has significant PAD on the left Past medical history includes type 2 diabetes poorly controlled last hemoglobin A1c at 12.7 in May, hypertension, hyperlipidemia, atrial fib on Eliquis, coronary artery disease status post CABG, left scapular fracture in May of this year, AAA repair. Bilateral leg weakness ABIs were not done in our clinic as they were just done in May of this year at [facility] vascular 7/14; patient arrives back in clinic he has areas on the left Achilles heel, base of the left fifth metatarsal, right lateral fifth metatarsal. The area he had over the coccyx appears to be healed but this area remains vulnerable. We have been using Iodoflex. I rereviewed his ABIs which were done in May. I do not know exactly what prompted these test however they do show a TBI of 0.44 on the left and monophasic waveforms. 7/21; the patient's wounds are about the same. Certainly no healing and the nurses noted some green drainage on the right fifth metatarsal head, left Achilles a little larger. We have been using silver alginate really without any improvement. We did get an appointment with vascular surgery however its that it did the end of August and they seem to want to repeat the noninvasive test that they have already done in May. I am not sure what the issue here is. We advised the patient's sister to call and point to  the idea that they have already had noninvasive studies. The real question I have is does this patient need an angiogram. He is a diabetic and it is possible that the ABIs are falsely elevated because of medial calcification. At least on the left his TBI was abnormal 08/08/2020 upon evaluation today patient appears to be doing about the same in regard to his wounds. He still waiting the appointment with vascular. This is something that hopefully should be undertaken shortly.  The 29th of this month is when he is actually scheduled although they have put in a call to move this up if at all possible. Fortunately there does not appear to be any signs of active infection at this time. No fevers, chills, nausea, vomiting, or diarrhea. 8/18; appointment with Dr. Trula Slade on 8/29. The patient has wounds on the left Achilles heel, left lateral foot and the right lateral fifth metatarsal head. We have been using Santyl under Hydrofera Blue. The surface of especially the left heel looks better to me than what I remember. 9/1; since the patient was last here he underwent angiography by Dr. Trula Slade he also underwent noninvasive studies first showing a ABI of of 0.64 with monophasic waveforms. His TBI was 0.35 with a left great toe pressure of 46 this is about the same pattern as we saw previously. The left was a lot worse than the right. He underwent an angiogram on 08/29/2020. He had multiple 80% peroneal artery stenosis which was his dominant vessel which was treated with angioplasty with no residual stenosis. 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 The anterior tibial artery was also occluded. An attempt was made at recanalization of the anterior tibial artery however this was unsuccessful. Noted that the peroneal artery reconstitutes the posterior tibial artery and the anterior tibial artery creating the plantar arch. The patient has areas on the Achilles left heel left lateral foot and right lateral fifth metatarsal head we have been using Santyl and Hydrofera Blue 9/22; since the patient was last here he went for repeat vascular studies. On the right his ABI was 0.94 with biphasic waveforms TBI of 0.73 with a great toe pressure of 75. On the left which is the problematic wound his ABI was 1.08 but with biphasic waveforms at the PTA. Biphasic waveforms at the dorsalis pedis his TBI was 0.63 with a great toe pressure of 65. On the left however this does represent a fairly  marked improvement. We have been using Santyl and Hydrofera Blue 9/29 left Achilles area. Still is having necrotic surface. Bleeds very easily as the patient is on a combination of Eliquis aspirin and Plavix just stopped yesterday. I am not going to do any debridement until next week. We are using Santyl and Hydrofera Blue. His arterial status on the right was actually quite good. The area on the fifth metatarsal head needs additional debridement. A total contact cast in this area is not out of the question. He does describe claudication I believe with minimal activity. He is not very active X-ray we ordered last week is still pending in terms of the report Electronic Signature(s) Signed: 09/30/2020 3:46:44 PM By: Linton Ham MD Entered By: Linton Ham on 09/29/2020 09:18:11 -------------------------------------------------------------------------------- Physical Exam Details Patient Name: Date of Service: [name] [name] [facility] THA N L. 09/29/2020 8:00 A M Medical Record Number: [id number] Patient Account Number: [id number] Date of Birth/Sex: Treating RN: [birth year] (65 y.o. [name] Primary Care Provider: [name] Other Clinician: Referring Provider: Treating Provider/Extender:  [name] in Treatment: 12 Constitutional Patient is hypotensive. However he appears well. Somewhat bradycardic. Respirations regular, non-labored and within target range.. Temperature is normal and within the target range for the patient.Marland Kitchen Appears in no distress. No systemic distress. Psychiatric appears at normal baseline. Notes Wound exam; left Achilles heel. 50% of this is necrotic but looks better than last time. This is bleeding very easily I think a combination of triple anticoagulant therapy. The left fifth metatarsal head has a less than viable surface. This probably needs ongoing debridement. Wound is not much measuring much better Electronic Signature(s) Signed:  09/30/2020 3:46:44 PM By: Linton Ham MD Entered By: Linton Ham on 09/29/2020 08:32:19 -------------------------------------------------------------------------------- Physician Orders Details Patient Name: Date of Service: [name] [name] [facility] THA N L. 09/29/2020 8:00 A M Medical Record Number: [id number] Patient Account Number: [id number] Date of Birth/Sex: Treating RN: 1955-02-23 (65 y.o. Marcheta Grammes Primary Care Provider: [name] Other Clinician: Referring Provider: Treating Provider/Extender: [name] in Treatment: 12 Verbal / Phone Orders: No Diagnosis Coding ICD-10 Coding Code Description E11.621 Type 2 diabetes mellitus with foot ulcer L97.528 Non-pressure chronic ulcer of other part of left foot with other specified severity L97.511 Non-pressure chronic ulcer of other part of right foot limited to breakdown of skin L89.622 Pressure ulcer of left heel, stage 2 E11.51 Type 2 diabetes mellitus with diabetic peripheral angiopathy without gangrene E11.42 Type 2 diabetes mellitus with diabetic polyneuropathy Follow-up Appointments ppointment in 1 week. - Dr. Dellia Nims Return A Bathing/ Shower/ Hygiene May shower and wash wound with soap and water. - with dressing changes only. Edema Control - Lymphedema / SCD / Other Elevate legs to the level of the heart or above for 30 minutes daily and/or when sitting, a frequency of: - 3-4 times a day throughout the day. Off-Loading Turn and reposition every 2 hours Other: - float heels while resting in bed or chair. Use a pillow or rolled sheet under legs to offload the heels. Home Health New wound care orders this week; continue Home Health for wound care. May utilize formulary equivalent dressing for wound treatment orders unless otherwise specified. - Add Santyl to right lateral foot dressing. Other Home Health Orders/Instructions: - [facility] home health three times a week and family to change all  other days. Wound Treatment Wound #1 - Calcaneus Wound Laterality: Left Cleanser: Soap and Water (Home Health) 1 x Per Day/30 Days Discharge Instructions: May shower and wash wound with dial antibacterial soap and water prior to dressing change. Cleanser: Wound Cleanser (Home Health) 1 x Per Day/30 Days Discharge Instructions: Cleanse the wound with wound cleanser prior to applying a clean dressing using gauze sponges, not tissue or cotton balls. Prim Dressing: Hydrofera Blue Classic Foam, 4x4 in (Home Health) 1 x Per Day/30 Days ary Discharge Instructions: Moisten with saline prior to applying to wound bed Prim Dressing: Santyl Ointment ([place]) 1 x Per Day/30 Days ary Discharge Instructions: Apply nickel thick amount to wound bed as instructed Secondary Dressing: Woven Gauze Sponge, Non-Sterile 4x4 in (Home Health) 1 x Per Day/30 Days Discharge Instructions: Apply over primary dressing as directed. Secondary Dressing: ALLEVYN Heel 4 1/2in x 5 1/2in / 10.5cm x 13.5cm (Home Health) 1 x Per Day/30 Days Discharge Instructions: Apply over primary dressing as directed. Secured With: [employer], 4.5x3.1 (in/yd) (Home Health) 1 x Per Day/30 Days Discharge Instructions: Secure with Kerlix as directed. Secured With: 13M Medipore H Public affairs consultant Surgical T 4  x 2 (in/yd) (Home Health) 1 x Per Day/30 Days ape Discharge Instructions: Secure dressing with tape as directed. Wound #3 - Foot Wound Laterality: Right, Lateral Cleanser: Soap and Water (Home Health) 1 x Per Day/30 Days Discharge Instructions: May shower and wash wound with dial antibacterial soap and water prior to dressing change. Cleanser: Wound Cleanser (Home Health) 1 x Per Day/30 Days Discharge Instructions: Cleanse the wound with wound cleanser prior to applying a clean dressing using gauze sponges, not tissue or cotton balls. Prim Dressing: Hydrofera Blue Classic Foam, 4x4 in (Home Health) 1 x Per Day/30 Days ary Discharge  Instructions: Moisten with saline prior to applying to wound bed Prim Dressing: Santyl Ointment ([place]) 1 x Per Day/30 Days ary Discharge Instructions: Apply nickel thick amount to wound bed as instructed Secondary Dressing: Woven Gauze Sponge, Non-Sterile 4x4 in (Home Health) 1 x Per Day/30 Days Discharge Instructions: Apply over primary dressing as directed. Secured With: [employer], 4.5x3.1 (in/yd) (Home Health) 1 x Per Day/30 Days Discharge Instructions: Secure with Kerlix as directed. Secured With: 66M Medipore H Soft Cloth Surgical T 4 x 2 (in/yd) (Home Health) 1 x Per Day/30 Days ape Discharge Instructions: Secure dressing with tape as directed. Electronic Signature(s) Signed: 09/29/2020 6:20:31 PM By: Lorrin Jackson Signed: 09/30/2020 3:46:44 PM By: Linton Ham MD Entered By: Lorrin Jackson on 09/29/2020 08:43:36 -------------------------------------------------------------------------------- Problem List Details Patient Name: Date of Service: [name] [name] [facility] THA N L. 09/29/2020 8:00 A M Medical Record Number: [id number] Patient Account Number: [id number] Date of Birth/Sex: Treating RN: [birth year] (65 y.o. Marcheta Grammes Primary Care Provider: Other Clinician: [name] Referring Provider: Treating Provider/Extender: [name] in Treatment: 12 Active Problems ICD-10 Encounter Code Description Active Date MDM Diagnosis E11.621 Type 2 diabetes mellitus with foot ulcer 07/07/2020 No Yes E11.51 Type 2 diabetes mellitus with diabetic peripheral angiopathy without gangrene 09/01/2020 No Yes L97.528 Non-pressure chronic ulcer of other part of left foot with other specified 07/07/2020 No Yes severity L97.511 Non-pressure chronic ulcer of other part of right foot limited to breakdown of 07/07/2020 No Yes skin E11.42 Type 2 diabetes mellitus with diabetic polyneuropathy 07/07/2020 No Yes Inactive Problems ICD-10 Code Description Active  Date Inactive Date L89.152 Pressure ulcer of sacral region, stage 2 07/07/2020 07/07/2020 I71.245 Pressure ulcer of left heel, stage 2 07/07/2020 07/07/2020 Resolved Problems Electronic Signature(s) Signed: 09/30/2020 3:46:44 PM By: Linton Ham MD Entered By: Linton Ham on 09/29/2020 09:16:52 -------------------------------------------------------------------------------- Progress Note Details Patient Name: Date of Service: [name] [name] [facility] THA N L. 09/29/2020 8:00 A M Medical Record Number: [id number] Patient Account Number: [id number] Date of Birth/Sex: Treating RN: [birth year] (65 y.o. [name] Primary Care Provider: [name] Other Clinician: Referring Provider: Treating Provider/Extender: [name] in Treatment: 12 Subjective History of Present Illness (HPI) ADMISSION 07/07/2020 This is a 65 year old man who is a type II diabetic. I have reviewed this in epic. It appeared that his diabetes was diagnosed in 2009 at which time he was on metformin. He was followed for a period of time by an endocrinologist with [facility] to always labeled him as a type II diabetic. Recently in epic there is been a switch to type 1 diabetes. If there were defining antibody test done I do not see them and I think the totality of the evidence would suggest that he is a type II diabetic. His recent problem started in May when he fell down 17 stairs. He suffered a  scapular fracture he was hospitalized at [facility] for 9 days and then discharged to [facility]. His mother who is present said that most of these wounds seem to develop surrounding the discharge from hospital or the stay at [facility]. He has an area on his lower sacrum which appears currently to be a stage II but per their description this is actually improved quite a bit. He has an area on the left lateral foot at roughly the base of the fifth metatarsal, the left Achilles heel and the right fifth metatarsal head  laterally they have been using topical antibiotics. Seen by primary care yesterday and placed on Keflex The patient had arterial studies on 05/05/2020. I wondered whether this had something to do with wounds on his feet at that time but I have not been able to determine this. At that time his ABI on the right was 1.09 with a TBI of 0.65 on the left 0.88 with a TBI of 0.44. Waveforms were triphasic on the right monophasic on the left. He likely has significant PAD on the left Past medical history includes type 2 diabetes poorly controlled last hemoglobin A1c at 12.7 in May, hypertension, hyperlipidemia, atrial fib on Eliquis, coronary artery disease status post CABG, left scapular fracture in May of this year, AAA repair. Bilateral leg weakness ABIs were not done in our clinic as they were just done in May of this year at [facility] 7/14; patient arrives back in clinic he has areas on the left Achilles heel, base of the left fifth metatarsal, right lateral fifth metatarsal. The area he had over the coccyx appears to be healed but this area remains vulnerable. We have been using Iodoflex. I rereviewed his ABIs which were done in May. I do not know exactly what prompted these test however they do show a TBI of 0.44 on the left and monophasic waveforms. 7/21; the patient's wounds are about the same. Certainly no healing and the nurses noted some green drainage on the right fifth metatarsal head, left Achilles a little larger. We have been using silver alginate really without any improvement. We did get an appointment with vascular surgery however its that it did the end of August and they seem to want to repeat the noninvasive test that they have already done in May. I am not sure what the issue here is. We advised the patient's sister to call and point to the idea that they have already had noninvasive studies. The real question I have is does this patient need an angiogram. He is a diabetic and it  is possible that the ABIs are falsely elevated because of medial calcification. At least on the left his TBI was abnormal 08/08/2020 upon evaluation today patient appears to be doing about the same in regard to his wounds. He still waiting the appointment with vascular. This is something that hopefully should be undertaken shortly. The 29th of this month is when he is actually scheduled although they have put in a call to move this up if at all possible. Fortunately there does not appear to be any signs of active infection at this time. No fevers, chills, nausea, vomiting, or diarrhea. 8/18; appointment with Dr. Trula Slade on 8/29. The patient has wounds on the left Achilles heel, left lateral foot and the right lateral fifth metatarsal head. We have been using Santyl under Hydrofera Blue. The surface of especially the left heel looks better to me than what I remember. 9/1; since the patient  was last here he underwent angiography by Dr. Trula Slade he also underwent noninvasive studies first showing a ABI of of 0.64 with monophasic waveforms. His TBI was 0.35 with a left great toe pressure of 46 this is about the same pattern as we saw previously. The left was a lot worse than the right. He underwent an angiogram on 08/29/2020. He had multiple 80% peroneal artery stenosis which was his dominant vessel which was treated with angioplasty with no residual stenosis. 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 The anterior tibial artery was also occluded. An attempt was made at recanalization of the anterior tibial artery however this was unsuccessful. Noted that the peroneal artery reconstitutes the posterior tibial artery and the anterior tibial artery creating the plantar arch. The patient has areas on the Achilles left heel left lateral foot and right lateral fifth metatarsal head we have been using Santyl and Hydrofera Blue 9/22; since the patient was last here he went for repeat vascular studies. On the  right his ABI was 0.94 with biphasic waveforms TBI of 0.73 with a great toe pressure of 75. On the left which is the problematic wound his ABI was 1.08 but with biphasic waveforms at the PTA. Biphasic waveforms at the dorsalis pedis his TBI was 0.63 with a great toe pressure of 65. On the left however this does represent a fairly marked improvement. We have been using Santyl and Hydrofera Blue 9/29 left Achilles area. Still is having necrotic surface. Bleeds very easily as the patient is on a combination of Eliquis aspirin and Plavix just stopped yesterday. I am not going to do any debridement until next week. We are using Santyl and Hydrofera Blue. His arterial status on the right was actually quite good. The area on the fifth metatarsal head needs additional debridement. A total contact cast in this area is not out of the question. He does describe claudication I believe with minimal activity. He is not very active Objective Constitutional Patient is hypotensive. However he appears well. Somewhat bradycardic. Respirations regular, non-labored and within target range.. Temperature is normal and within the target range for the patient.Marland Kitchen Appears in no distress. No systemic distress. Vitals Time Taken: 7:54 AM, Height: 74 in, Weight: 151 lbs, BMI: 19.4, Temperature: 98.7 F, Pulse: 53 bpm, Respiratory Rate: 16 breaths/min, Blood Pressure: 92/62 mmHg. Psychiatric appears at normal baseline. General Notes: Wound exam; left Achilles heel. 50% of this is necrotic but looks better than last time. This is bleeding very easily I think a combination of triple anticoagulant therapy. oo The left fifth metatarsal head has a less than viable surface. This probably needs ongoing debridement. Wound is not much measuring much better Integumentary (Hair, Skin) Wound #1 status is Open. Original cause of wound was Pressure Injury. The date acquired was: 05/23/2020. The wound has been in treatment 12 weeks.  The wound is located on the Left Calcaneus. The wound measures 3.5cm length x 2.5cm width x 1cm depth; 6.872cm^2 area and 6.872cm^3 volume. There is bone and Fat Layer (Subcutaneous Tissue) exposed. There is no tunneling or undermining noted. There is a medium amount of serosanguineous drainage noted. The wound margin is distinct with the outline attached to the wound base. There is medium (34-66%) pink granulation within the wound bed. There is a medium (34- 66%) amount of necrotic tissue within the wound bed including Eschar and Adherent Slough. Wound #3 status is Open. Original cause of wound was Gradually Appeared. The date acquired was: 06/21/2020. The wound has  been in treatment 12 weeks. The wound is located on the Right,Lateral Foot. The wound measures 0.7cm length x 1.4cm width x 0.2cm depth; 0.77cm^2 area and 0.154cm^3 volume. There is Fat Layer (Subcutaneous Tissue) exposed. There is no tunneling or undermining noted. There is a medium amount of serosanguineous drainage noted. The wound margin is distinct with the outline attached to the wound base. There is large (67-100%) pink, pale granulation within the wound bed. There is no necrotic tissue within the wound bed. General Notes: maceration noted Assessment Active Problems ICD-10 Type 2 diabetes mellitus with foot ulcer Type 2 diabetes mellitus with diabetic peripheral angiopathy without gangrene Non-pressure chronic ulcer of other part of left foot with other specified severity Non-pressure chronic ulcer of other part of right foot limited to breakdown of skin Type 2 diabetes mellitus with diabetic polyneuropathy Plan Follow-up Appointments: Return Appointment in 1 week. - Dr. Dellia Nims Bathing/ Shower/ Hygiene: May shower and wash wound with soap and water. - with dressing changes only. Edema Control - Lymphedema / SCD / Other: Elevate legs to the level of the heart or above for 30 minutes daily and/or when sitting, a frequency  of: - 3-4 times a day throughout the day. Off-Loading: Turn and reposition every 2 hours Other: - float heels while resting in bed or chair. Use a pillow or rolled sheet under legs to offload the heels. Home Health: New wound care orders this week; continue Home Health for wound care. May utilize formulary equivalent dressing for wound treatment orders unless otherwise specified. - Add Santyl to right lateral foot dressing. Other Home Health Orders/Instructions: - [facility] home health three times a week and family to change all other days to left heel. WOUND #1: - Calcaneus Wound Laterality: Left Cleanser: Soap and Water (Home Health) 1 x Per Day/30 Days Discharge Instructions: May shower and wash wound with dial antibacterial soap and water prior to dressing change. Cleanser: Wound Cleanser (Home Health) 1 x Per Day/30 Days Discharge Instructions: Cleanse the wound with wound cleanser prior to applying a clean dressing using gauze sponges, not tissue or cotton balls. Prim Dressing: Hydrofera Blue Classic Foam, 4x4 in (Home Health) 1 x Per Day/30 Days ary Discharge Instructions: Moisten with saline prior to applying to wound bed Prim Dressing: Santyl Ointment ([place]) 1 x Per Day/30 Days ary Discharge Instructions: Apply nickel thick amount to wound bed as instructed Secondary Dressing: Woven Gauze Sponge, Non-Sterile 4x4 in (Home Health) 1 x Per Day/30 Days Discharge Instructions: Apply over primary dressing as directed. Secondary Dressing: ALLEVYN Heel 4 1/2in x 5 1/2in / 10.5cm x 13.5cm (Home Health) 1 x Per Day/30 Days Discharge Instructions: Apply over primary dressing as directed. Secured With: [employer], 4.5x3.1 (in/yd) (Home Health) 1 x Per Day/30 Days Discharge Instructions: Secure with Kerlix as directed. Secured With: 77M Medipore H Soft Cloth Surgical T 4 x 2 (in/yd) (Home Health) 1 x Per Day/30 Days ape Discharge Instructions: Secure dressing with tape as  directed. WOUND #3: - Foot Wound Laterality: Right, Lateral Cleanser: Soap and Water (Home Health) 1 x Per Day/30 Days Discharge Instructions: May shower and wash wound with dial antibacterial soap and water prior to dressing change. Cleanser: Wound Cleanser (Home Health) 1 x Per Day/30 Days Discharge Instructions: Cleanse the wound with wound cleanser prior to applying a clean dressing using gauze sponges, not tissue or cotton balls. Prim Dressing: Hydrofera Blue Classic Foam, 4x4 in (Home Health) 1 x Per Day/30 Days ary Discharge Instructions: Moisten  with saline prior to applying to wound bed Prim Dressing: Santyl Ointment ([place]) 1 x Per Day/30 Days ary Discharge Instructions: Apply nickel thick amount to wound bed as instructed Secondary Dressing: Woven Gauze Sponge, Non-Sterile 4x4 in (Home Health) 1 x Per Day/30 Days Discharge Instructions: Apply over primary dressing as directed. Secured With: [employer], 4.5x3.1 (in/yd) (Home Health) 1 x Per Day/30 Days Discharge Instructions: Secure with Kerlix as directed. Secured With: 30M Medipore H Soft Cloth Surgical T 4 x 2 (in/yd) (Home Health) 1 x Per Day/30 Days ape Discharge Instructions: Secure dressing with tape as directed. 1. Both wounds require additional debridement I am continuing the Santyl. Mechanical debridement may be attempted but not today while he is coming off the Plavix [on Eliquis and aspirin] 2. He might benefit from a total contact cast on the right here his blood supply seems adequate 3. Revascularized on the left with probably marginal flow although he certainly bleeds well from the Achilles wound. 4. Also wondering about advanced treatment options Electronic Signature(s) Signed: 09/30/2020 3:46:44 PM By: Linton Ham MD Entered By: Linton Ham on 09/29/2020 08:33:42 -------------------------------------------------------------------------------- SuperBill Details Patient Name: Date of  Service: [name] [name] HNA THA N L. 09/29/2020 Medical Record Number: [id number] Patient Account Number: [id number] Date of Birth/Sex: Treating RN: April 20, 1955 (65 y.o. 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 to breakdown of skin L89.622 Pressure ulcer of left heel, stage 2 E11.51 Type 2 diabetes mellitus with diabetic peripheral angiopathy without gangrene E11.42 Type 2 diabetes mellitus with diabetic polyneuropathy Facility Procedures CPT4 Code: [id number] Description: 99213 - WOUND CARE VISIT-LEV 3 EST PT Modifier: Quantity: 1 Physician Procedures : CPT4 Code Description Modifier [id number] - WC PHYS LEVEL 3 - EST PT ICD-10 Diagnosis Description L97.528 Non-pressure chronic ulcer of other part of left foot with other specified severity L97.511 Non-pressure chronic ulcer of other part of right  foot limited to breakdown of skin E11.51 Type 2 diabetes mellitus with diabetic peripheral angiopathy without gangrene Quantity: 1 Electronic Signature(s) Signed: 09/30/2020 3:46:44 PM By: Linton Ham MD Entered By: Linton Ham on 09/29/2020 08:34:03

## 2020-09-30 NOTE — Progress Notes (Signed)
[name], [name] ([id number]) Visit Report for 09/29/2020 Arrival Information Details Patient Name: Date of Service: [name] [name] [facility] THA N L. 09/29/2020 8:00 A M Medical Record Number: [id number] Patient Account Number: [id number] Date of Birth/Sex: Treating RN: [birth year] (65 y.o. [name] [name] Primary Care Demri Poulton: [name] Other Clinician: Referring Raimi Guillermo: Treating Banner Huckaba/Extender: [name] in Treatment: 12 Visit Information History Since Last Visit All ordered tests and consults were completed: Yes Patient Arrived: Wheel Chair Added or deleted any medications: No Arrival Time: 07:48 Any new allergies or adverse reactions: No Accompanied By: mother Had a fall or experienced change in No Transfer Assistance: Manual activities of daily living that may affect Patient Identification Verified: Yes risk of falls: Secondary Verification Process Completed: Yes Signs or symptoms of abuse/neglect since last visito No Patient Requires Transmission-Based Precautions: No Hospitalized since last visit: No Patient Has Alerts: Yes Implantable device outside of the clinic excluding No Patient Alerts: Patient on Blood Thinner cellular tissue based products placed in the center since last visit: Has Dressing in Place as Prescribed: Yes Pain Present Now: No Electronic Signature(s) Signed: 09/29/2020 6:20:31 PM By: Lorrin Jackson Entered By: Lorrin Jackson on 09/29/2020 07:54:45 -------------------------------------------------------------------------------- Clinic Level of Care Assessment Details Patient Name: Date of Service: [name] [name] [facility] THA N L. 09/29/2020 8:00 A M Medical Record Number: [id number] Patient Account Number: [id number] Date of Birth/Sex: Treating RN: Feb 25, 1955 (65 y.o. [name] [name] Primary Care Verne Cove: [name] Other Clinician: Referring Lochlyn Zullo: Treating Orella Cushman/Extender: [name] in  Treatment: 12 Clinic Level of Care Assessment Items TOOL 4 Quantity Score X- 1 0 Use when only an EandM is performed on FOLLOW-UP visit ASSESSMENTS - Nursing Assessment / Reassessment X- 1 10 Reassessment of Co-morbidities (includes updates in patient status) X- 1 5 Reassessment of Adherence to Treatment Plan ASSESSMENTS - Wound and Skin A ssessment / Reassessment []  - 0 Simple Wound Assessment / Reassessment - one wound X- 2 5 Complex Wound Assessment / Reassessment - multiple wounds []  - 0 Dermatologic / Skin Assessment (not related to wound area) ASSESSMENTS - Focused Assessment X- 1 5 Circumferential Edema Measurements - multi extremities []  - 0 Nutritional Assessment / Counseling / Intervention []  - 0 Lower Extremity Assessment (monofilament, tuning fork, pulses) []  - 0 Peripheral Arterial Disease Assessment (using hand held doppler) ASSESSMENTS - Ostomy and/or Continence Assessment and Care []  - 0 Incontinence Assessment and Management []  - 0 Ostomy Care Assessment and Management (repouching, etc.) PROCESS - Coordination of Care []  - 0 Simple Patient / Family Education for ongoing care X- 1 20 Complex (extensive) Patient / Family Education for ongoing care []  - 0 Staff obtains Programmer, systems, Records, T Results / Process Orders est X- 1 10 Staff telephones HHA, Nursing Homes / Clarify orders / etc []  - 0 Routine Transfer to another Facility (non-emergent condition) []  - 0 Routine Hospital Admission (non-emergent condition) []  - 0 New Admissions / Biomedical engineer / Ordering NPWT Apligraf, etc. , []  - 0 Emergency Hospital Admission (emergent condition) []  - 0 Simple Discharge Coordination []  - 0 Complex (extensive) Discharge Coordination PROCESS - Special Needs []  - 0 Pediatric / Minor Patient Management []  - 0 Isolation Patient Management []  - 0 Hearing / Language / Visual special needs []  - 0 Assessment of Community assistance (transportation,  D/C planning, etc.) []  - 0 Additional assistance / Altered mentation []  - 0 Support Surface(s) Assessment (bed, cushion, seat, etc.) INTERVENTIONS - Wound Cleansing / Measurement []  -  0 Simple Wound Cleansing - one wound X- 2 5 Complex Wound Cleansing - multiple wounds X- 1 5 Wound Imaging (photographs - any number of wounds) []  - 0 Wound Tracing (instead of photographs) []  - 0 Simple Wound Measurement - one wound X- 2 5 Complex Wound Measurement - multiple wounds INTERVENTIONS - Wound Dressings X - Small Wound Dressing one or multiple wounds 1 10 X- 1 15 Medium Wound Dressing one or multiple wounds []  - 0 Large Wound Dressing one or multiple wounds []  - 0 Application of Medications - topical []  - 0 Application of Medications - injection INTERVENTIONS - Miscellaneous []  - 0 External ear exam []  - 0 Specimen Collection (cultures, biopsies, blood, body fluids, etc.) []  - 0 Specimen(s) / Culture(s) sent or taken to Lab for analysis []  - 0 Patient Transfer (multiple staff / Civil Service fast streamer / Similar devices) []  - 0 Simple Staple / Suture removal (25 or less) []  - 0 Complex Staple / Suture removal (26 or more) []  - 0 Hypo / Hyperglycemic Management (close monitor of Blood Glucose) []  - 0 Ankle / Brachial Index (ABI) - do not check if billed separately X- 1 5 Vital Signs Has the patient been seen at the hospital within the last three years: Yes Total Score: 115 Level Of Care: New/Established - Level 3 Electronic Signature(s) Signed: 09/29/2020 6:20:31 PM By: Lorrin Jackson Entered By: Lorrin Jackson on 09/29/2020 08:22:18 -------------------------------------------------------------------------------- Encounter Discharge Information Details Patient Name: Date of Service: [name] [name] [facility] THA N L. 09/29/2020 8:00 A M Medical Record Number: [id number] Patient Account Number: [id number] Date of Birth/Sex: Treating RN: 07/27/1955 (65 y.o. [name] [name] Primary Care  Ciaira Natividad: [name] Other Clinician: Referring Valoree Agent: Treating Haydan Wedig/Extender: [name] in Treatment: 12 Encounter Discharge Information Items Discharge Condition: Stable Ambulatory Status: Wheelchair Discharge Destination: Home Transportation: Private Auto Accompanied By: mother Schedule Follow-up Appointment: Yes Clinical Summary of Care: Provided on 09/29/2020 Form Type Recipient Paper Patient Patient Electronic Signature(s) Signed: 09/29/2020 6:20:31 PM By: Lorrin Jackson Entered By: Lorrin Jackson on 09/29/2020 08:37:05 -------------------------------------------------------------------------------- Lower Extremity Assessment Details Patient Name: Date of Service: [name] [name] [facility] THA N L. 09/29/2020 8:00 A M Medical Record Number: [id number] Patient Account Number: [id number] Date of Birth/Sex: Treating RN: [birth year] (65 y.o. [name] [name] Primary Care Demi Trieu: [name] Other Clinician: Referring Emmette Katt: Treating Deysy Schabel/Extender: [name] in Treatment: 12 Edema Assessment Assessed: [name]: Yes] [name]: Yes] Edema: [Left: No] [Right: No] Calf Left: Right: Point of Measurement: 34 cm From Medial Instep 29 cm 29.5 cm Ankle Left: Right: Point of Measurement: 10 cm From Medial Instep 22 cm 23 cm Vascular Assessment Pulses: Dorsalis Pedis Palpable: [Left:Yes] [Right:Yes] Electronic Signature(s) Signed: 09/29/2020 6:20:31 PM By: Lorrin Jackson Entered By: Lorrin Jackson on 09/29/2020 08:07:27 -------------------------------------------------------------------------------- Multi Wound Chart Details Patient Name: Date of Service: [name] [name] [facility] THA N L. 09/29/2020 8:00 A M Medical Record Number: [id number] Patient Account Number: [id number] Date of Birth/Sex: Treating RN: 1955-07-31 (65 y.o. [name] Primary Care Sonnet Rizor: [name] Other Clinician: Referring Glennette Galster: Treating  Berenise Hunton/Extender: [name] in Treatment: 12 Vital Signs Height(in): 39 Pulse(bpm): 22 Weight(lbs): 151 Blood Pressure(mmHg): 92/62 Body Mass Index(BMI): 19 Temperature(F): 98.7 Respiratory Rate(breaths/min): 16 Photos: [N/A:N/A] Left Calcaneus Right, Lateral Foot N/A Wound Location: Pressure Injury Gradually Appeared N/A Wounding Event: Pressure Ulcer Diabetic Wound/Ulcer of the Lower N/A Primary Etiology: Extremity Coronary Artery Disease, Coronary Artery Disease, N/A Comorbid History: Hypertension, Type  II Diabetes, Hypertension, Type II Diabetes, Osteoarthritis, Neuropathy Osteoarthritis, Neuropathy 05/23/2020 06/21/2020 N/A Date Acquired: 12 12 N/A Weeks of Treatment: Open Open N/A Wound Status: 3.5x2.5x1 0.7x1.4x0.2 N/A Measurements L x W x D (cm) 6.872 0.77 N/A A (cm) : rea 6.872 0.154 N/A Volume (cm) : 45.30% 61.60% N/A % Reduction in A rea: -173.50% 61.60% N/A % Reduction in Volume: Category/Stage IV Grade 2 N/A Classification: Medium Medium N/A Exudate A mount: Serosanguineous Serosanguineous N/A Exudate Type: red, brown red, brown N/A Exudate Color: Distinct, outline attached Distinct, outline attached N/A Wound Margin: Medium (34-66%) Large (67-100%) N/A Granulation A mount: Pink Pink, Pale N/A Granulation Quality: Medium (34-66%) None Present (0%) N/A Necrotic A mount: Eschar, Adherent Slough N/A N/A Necrotic Tissue: Fat Layer (Subcutaneous Tissue): Yes Fat Layer (Subcutaneous Tissue): Yes N/A Exposed Structures: Bone: Yes Fascia: No Fascia: No Tendon: No Tendon: No Muscle: No Muscle: No Joint: No Joint: No Bone: No Small (1-33%) Medium (34-66%) N/A Epithelialization: N/A maceration noted N/A Assessment Notes: Treatment Notes Wound #1 (Calcaneus) Wound Laterality: Left Cleanser Soap and Water Discharge Instruction: May shower and wash wound with dial antibacterial soap and water prior to dressing  change. Wound Cleanser Discharge Instruction: Cleanse the wound with wound cleanser prior to applying a clean dressing using gauze sponges, not tissue or cotton balls. Peri-Wound Care Topical Primary Dressing Hydrofera Blue Classic Foam, 4x4 in Discharge Instruction: Moisten with saline prior to applying to wound bed Santyl Ointment Discharge Instruction: Apply nickel thick amount to wound bed as instructed Secondary Dressing Woven Gauze Sponge, Non-Sterile 4x4 in Discharge Instruction: Apply over primary dressing as directed. ALLEVYN Heel 4 1/2in x 5 1/2in / 10.5cm x 13.5cm Discharge Instruction: Apply over primary dressing as directed. Secured With [employer], 4.5x3.1 (in/yd) Discharge Instruction: Secure with Kerlix as directed. 17M Medipore H Soft Cloth Surgical T 4 x 2 (in/yd) ape Discharge Instruction: Secure dressing with tape as directed. Compression Wrap Compression Stockings Add-Ons Wound #3 (Foot) Wound Laterality: Right, Lateral Cleanser Soap and Water Discharge Instruction: May shower and wash wound with dial antibacterial soap and water prior to dressing change. Wound Cleanser Discharge Instruction: Cleanse the wound with wound cleanser prior to applying a clean dressing using gauze sponges, not tissue or cotton balls. Peri-Wound Care Topical Primary Dressing Hydrofera Blue Classic Foam, 4x4 in Discharge Instruction: Moisten with saline prior to applying to wound bed Santyl Ointment Discharge Instruction: Apply nickel thick amount to wound bed as instructed Secondary Dressing Woven Gauze Sponge, Non-Sterile 4x4 in Discharge Instruction: Apply over primary dressing as directed. Secured With [employer], 4.5x3.1 (in/yd) Discharge Instruction: Secure with Kerlix as directed. 17M Medipore H Soft Cloth Surgical T 4 x 2 (in/yd) ape Discharge Instruction: Secure dressing with tape as directed. Compression Wrap Compression  Stockings Add-Ons Electronic Signature(s) Signed: 09/29/2020 2:50:47 PM By: [name] RN, BSN Signed: 09/30/2020 3:46:44 PM By: Linton Ham MD Entered By: Linton Ham on 09/29/2020 09:17:05 -------------------------------------------------------------------------------- Multi-Disciplinary Care Plan Details Patient Name: Date of Service: [name] [name] [facility] THA N L. 09/29/2020 8:00 A M Medical Record Number: [id number] Patient Account Number: [id number] Date of Birth/Sex: Treating RN: Jun 23, 1955 (65 y.o. [name] [name] Primary Care Teal Bontrager: [name] Other Clinician: Referring Jep Dyas: Treating Alice Burnside/Extender: [name] in Treatment: 12 Active Inactive Necrotic Tissue Nursing Diagnoses: Impaired tissue integrity related to necrotic/devitalized tissue Knowledge deficit related to management of necrotic/devitalized tissue Goals: Necrotic/devitalized tissue will be minimized in the wound bed Date Initiated: 09/22/2020 Target Resolution Date:  10/07/2020 Goal Status: Active Patient/caregiver will verbalize understanding of reason and process for debridement of necrotic tissue Date Initiated: 09/22/2020 Target Resolution Date: 10/28/2020 Goal Status: Active Interventions: Provide education on necrotic tissue and debridement process Treatment Activities: Enzymatic debridement : 09/22/2020 Excisional debridement : 09/22/2020 Notes: Pain, Acute or Chronic Nursing Diagnoses: Pain, acute or chronic: actual or potential Potential alteration in comfort, pain Goals: Patient will verbalize adequate pain control and receive pain control interventions during procedures as needed Date Initiated: 07/07/2020 Target Resolution Date: 10/27/2020 Goal Status: Active Patient/caregiver will verbalize comfort level met Date Initiated: 07/07/2020 Date Inactivated: 09/22/2020 Target Resolution Date: 09/30/2020 Goal Status: Met Interventions: Encourage patient to  take pain medications as prescribed Provide education on pain management Reposition patient for comfort Treatment Activities: Administer pain control measures as ordered : 07/07/2020 Notes: Wound/Skin Impairment Nursing Diagnoses: Knowledge deficit related to ulceration/compromised skin integrity Goals: Patient/caregiver will verbalize understanding of skin care regimen Date Initiated: 07/07/2020 Target Resolution Date: 10/21/2020 Goal Status: Active Interventions: Assess patient/caregiver ability to obtain necessary supplies Assess patient/caregiver ability to perform ulcer/skin care regimen upon admission and as needed Provide education on ulcer and skin care Treatment Activities: Skin care regimen initiated : 07/07/2020 Topical wound management initiated : 07/07/2020 Notes: Electronic Signature(s) Signed: 09/29/2020 6:20:31 PM By: Lorrin Jackson Entered By: Lorrin Jackson on 09/29/2020 08:07:56 -------------------------------------------------------------------------------- Pain Assessment Details Patient Name: Date of Service: [name] [name] [facility] THA N L. 09/29/2020 8:00 A M Medical Record Number: [id number] Patient Account Number: [id number] Date of Birth/Sex: Treating RN: 30-Nov-1955 (65 y.o. [name] [name] Primary Care Glennie Bose: [name] Other Clinician: Referring Rosmary Dionisio: Treating Joanie Duprey/Extender: [name] in Treatment: 12 Active Problems Location of Pain Severity and Description of Pain Patient Has Paino No Site Locations Pain Management and Medication Current Pain Management: Electronic Signature(s) Signed: 09/29/2020 6:20:31 PM By: Lorrin Jackson Entered By: Lorrin Jackson on 09/29/2020 07:56:54 -------------------------------------------------------------------------------- Patient/Caregiver Education Details Patient Name: Date of Service: [name] [name] [facility] THA N L. 9/29/2022andnbsp8:00 A M Medical Record Number: [id number] Patient  Account Number: [id number] Date of Birth/Gender: Treating RN: Oct 20, 1955 (65 y.o. [name] [name] Primary Care Physician: [name] Other Clinician: Referring Physician: Treating Physician/Extender: [name] in Treatment: 12 Education Assessment Education Provided To: Patient and Caregiver Education Topics Provided Offloading: Methods: Explain/Verbal, Printed Responses: State content correctly Wound/Skin Impairment: Methods: Explain/Verbal, Printed Responses: State content correctly Electronic Signature(s) Signed: 09/29/2020 6:20:31 PM By: Lorrin Jackson Entered By: Lorrin Jackson on 09/29/2020 08:08:18 -------------------------------------------------------------------------------- Wound Assessment Details Patient Name: Date of Service: [name] [name] [facility] THA N L. 09/29/2020 8:00 A M Medical Record Number: [id number] Patient Account Number: [id number] Date of Birth/Sex: Treating RN: 03-05-1955 (65 y.o. [name] [name] Primary Care Georgeana Oertel: [name] Other Clinician: Referring Brande Uncapher: Treating Audric Venn/Extender: [name] in Treatment: 12 Wound Status Wound Number: 1 Primary Pressure Ulcer Etiology: Wound Location: Left Calcaneus Wound Open Wounding Event: Pressure Injury Status: Date Acquired: 05/23/2020 Comorbid Coronary Artery Disease, Hypertension, Type II Diabetes, Weeks Of Treatment: 12 History: Osteoarthritis, Neuropathy Clustered Wound: No Photos Wound Measurements Length: (cm) 3.5 Width: (cm) 2.5 Depth: (cm) 1 Area: (cm) 6.872 Volume: (cm) 6.872 % Reduction in Area: 45.3% % Reduction in Volume: -173.5% Epithelialization: Small (1-33%) Tunneling: No Undermining: No Wound Description Classification: Category/Stage IV Wound Margin: Distinct, outline attached Exudate Amount: Medium Exudate Type: Serosanguineous Exudate Color: red, brown Foul Odor After Cleansing: No Slough/Fibrino  Yes Wound Bed Granulation Amount: Medium (34-66%) Exposed Structure Granulation Quality:  Pink Fascia Exposed: No Necrotic Amount: Medium (34-66%) Fat Layer (Subcutaneous Tissue) Exposed: Yes Necrotic Quality: Eschar, Adherent Slough Tendon Exposed: No Muscle Exposed: No Joint Exposed: No Bone Exposed: Yes Treatment Notes Wound #1 (Calcaneus) Wound Laterality: Left Cleanser Soap and Water Discharge Instruction: May shower and wash wound with dial antibacterial soap and water prior to dressing change. Wound Cleanser Discharge Instruction: Cleanse the wound with wound cleanser prior to applying a clean dressing using gauze sponges, not tissue or cotton balls. Peri-Wound Care Topical Primary Dressing Hydrofera Blue Classic Foam, 4x4 in Discharge Instruction: Moisten with saline prior to applying to wound bed Santyl Ointment Discharge Instruction: Apply nickel thick amount to wound bed as instructed Secondary Dressing Woven Gauze Sponge, Non-Sterile 4x4 in Discharge Instruction: Apply over primary dressing as directed. ALLEVYN Heel 4 1/2in x 5 1/2in / 10.5cm x 13.5cm Discharge Instruction: Apply over primary dressing as directed. Secured With [employer], 4.5x3.1 (in/yd) Discharge Instruction: Secure with Kerlix as directed. 73M Medipore H Soft Cloth Surgical T 4 x 2 (in/yd) ape Discharge Instruction: Secure dressing with tape as directed. Compression Wrap Compression Stockings Add-Ons Electronic Signature(s) Signed: 09/29/2020 6:20:31 PM By: Lorrin Jackson Entered By: Lorrin Jackson on 09/29/2020 08:05:04 -------------------------------------------------------------------------------- Wound Assessment Details Patient Name: Date of Service: [name] [name] [facility] N L. 09/29/2020 8:00 A M Medical Record Number: [id number] Patient Account Number: [id number] Date of Birth/Sex: Treating RN: [birth year] (65 y.o. [name] [name] Primary Care Onell Mcmath: [name] Other  Clinician: Referring Yaiza Palazzola: Treating Anias Bartol/Extender: [name] in Treatment: 12 Wound Status Wound Number: 3 Primary Diabetic Wound/Ulcer of the Lower Extremity Etiology: Wound Location: Right, Lateral Foot Wound Open Wounding Event: Gradually Appeared Status: Date Acquired: 06/21/2020 Comorbid Coronary Artery Disease, Hypertension, Type II Diabetes, Weeks Of Treatment: 12 History: Osteoarthritis, Neuropathy Clustered Wound: No Photos Wound Measurements Length: (cm) 0.7 Width: (cm) 1.4 Depth: (cm) 0.2 Area: (cm) 0.77 Volume: (cm) 0.154 % Reduction in Area: 61.6% % Reduction in Volume: 61.6% Epithelialization: Medium (34-66%) Tunneling: No Undermining: No Wound Description Classification: Grade 2 Wound Margin: Distinct, outline attached Exudate Amount: Medium Exudate Type: Serosanguineous Exudate Color: red, brown Foul Odor After Cleansing: No Slough/Fibrino No Wound Bed Granulation Amount: Large (67-100%) Exposed Structure Granulation Quality: Pink, Pale Fascia Exposed: No Necrotic Amount: None Present (0%) Fat Layer (Subcutaneous Tissue) Exposed: Yes Tendon Exposed: No Muscle Exposed: No Joint Exposed: No Bone Exposed: No Assessment Notes maceration noted Treatment Notes Wound #3 (Foot) Wound Laterality: Right, Lateral Cleanser Soap and Water Discharge Instruction: May shower and wash wound with dial antibacterial soap and water prior to dressing change. Wound Cleanser Discharge Instruction: Cleanse the wound with wound cleanser prior to applying a clean dressing using gauze sponges, not tissue or cotton balls. Peri-Wound Care Topical Primary Dressing Hydrofera Blue Classic Foam, 4x4 in Discharge Instruction: Moisten with saline prior to applying to wound bed Santyl Ointment Discharge Instruction: Apply nickel thick amount to wound bed as instructed Secondary Dressing Woven Gauze Sponge, Non-Sterile 4x4 in Discharge  Instruction: Apply over primary dressing as directed. Secured With [employer], 4.5x3.1 (in/yd) Discharge Instruction: Secure with Kerlix as directed. 73M Medipore H Soft Cloth Surgical T 4 x 2 (in/yd) ape Discharge Instruction: Secure dressing with tape as directed. Compression Wrap Compression Stockings Add-Ons Electronic Signature(s) Signed: 09/29/2020 6:20:31 PM By: Lorrin Jackson Entered By: Lorrin Jackson on 09/29/2020 08:05:36 -------------------------------------------------------------------------------- Vitals Details Patient Name: Date of Service: [name] [name] [facility] TH[facility] N L. 09/29/2020 8:00 A M Medical Record  Number: [id number] Patient Account Number: [id number] Date of Birth/Sex: Treating RN: 05/05/1955 (65 y.o. [name] [name] Primary Care Deontra Pereyra: [name] Other Clinician: Referring Larya Charpentier: Treating Mckenzi Buonomo/Extender: [name] in Treatment: 12 Vital Signs Time Taken: 07:54 Temperature (F): 98.7 Height (in): 74 Pulse (bpm): 53 Weight (lbs): 151 Respiratory Rate (breaths/min): 16 Body Mass Index (BMI): 19.4 Blood Pressure (mmHg): 92/62 Reference Range: 80 - 120 mg / dl Electronic Signature(s) Signed: 09/29/2020 6:20:31 PM By: Lorrin Jackson Entered By: Lorrin Jackson on 09/29/2020 07:55:40

## 2020-10-03 ENCOUNTER — Encounter: Payer: HMO | Admitting: Surgery

## 2020-10-03 ENCOUNTER — Ambulatory Visit: Payer: HMO

## 2020-10-03 ENCOUNTER — Encounter (HOSPITAL_COMMUNITY): Payer: HMO

## 2020-10-03 DIAGNOSIS — L97511 Non-pressure chronic ulcer of other part of right foot limited to breakdown of skin: Secondary | ICD-10-CM | POA: Diagnosis not present

## 2020-10-03 DIAGNOSIS — S42132D Displaced fracture of coracoid process, left shoulder, subsequent encounter for fracture with routine healing: Secondary | ICD-10-CM | POA: Diagnosis not present

## 2020-10-03 DIAGNOSIS — E1142 Type 2 diabetes mellitus with diabetic polyneuropathy: Secondary | ICD-10-CM | POA: Diagnosis not present

## 2020-10-03 DIAGNOSIS — E11621 Type 2 diabetes mellitus with foot ulcer: Secondary | ICD-10-CM | POA: Diagnosis not present

## 2020-10-03 DIAGNOSIS — N138 Other obstructive and reflux uropathy: Secondary | ICD-10-CM | POA: Diagnosis not present

## 2020-10-03 DIAGNOSIS — L97523 Non-pressure chronic ulcer of other part of left foot with necrosis of muscle: Secondary | ICD-10-CM | POA: Diagnosis not present

## 2020-10-03 DIAGNOSIS — F419 Anxiety disorder, unspecified: Secondary | ICD-10-CM | POA: Diagnosis not present

## 2020-10-03 DIAGNOSIS — I480 Paroxysmal atrial fibrillation: Secondary | ICD-10-CM | POA: Diagnosis not present

## 2020-10-03 DIAGNOSIS — Z794 Long term (current) use of insulin: Secondary | ICD-10-CM | POA: Diagnosis not present

## 2020-10-03 DIAGNOSIS — E785 Hyperlipidemia, unspecified: Secondary | ICD-10-CM | POA: Diagnosis not present

## 2020-10-03 DIAGNOSIS — K5904 Chronic idiopathic constipation: Secondary | ICD-10-CM | POA: Diagnosis not present

## 2020-10-03 DIAGNOSIS — L409 Psoriasis, unspecified: Secondary | ICD-10-CM | POA: Diagnosis not present

## 2020-10-03 DIAGNOSIS — G8929 Other chronic pain: Secondary | ICD-10-CM | POA: Diagnosis not present

## 2020-10-03 DIAGNOSIS — N401 Enlarged prostate with lower urinary tract symptoms: Secondary | ICD-10-CM | POA: Diagnosis not present

## 2020-10-03 DIAGNOSIS — M47812 Spondylosis without myelopathy or radiculopathy, cervical region: Secondary | ICD-10-CM | POA: Diagnosis not present

## 2020-10-03 DIAGNOSIS — F3341 Major depressive disorder, recurrent, in partial remission: Secondary | ICD-10-CM | POA: Diagnosis not present

## 2020-10-03 DIAGNOSIS — Z7982 Long term (current) use of aspirin: Secondary | ICD-10-CM | POA: Diagnosis not present

## 2020-10-03 DIAGNOSIS — Z7901 Long term (current) use of anticoagulants: Secondary | ICD-10-CM | POA: Diagnosis not present

## 2020-10-03 DIAGNOSIS — D649 Anemia, unspecified: Secondary | ICD-10-CM | POA: Diagnosis not present

## 2020-10-03 DIAGNOSIS — M720 Palmar fascial fibromatosis [Dupuytren]: Secondary | ICD-10-CM | POA: Diagnosis not present

## 2020-10-03 DIAGNOSIS — I10 Essential (primary) hypertension: Secondary | ICD-10-CM | POA: Diagnosis not present

## 2020-10-03 DIAGNOSIS — E1151 Type 2 diabetes mellitus with diabetic peripheral angiopathy without gangrene: Secondary | ICD-10-CM | POA: Diagnosis not present

## 2020-10-03 DIAGNOSIS — I2510 Atherosclerotic heart disease of native coronary artery without angina pectoris: Secondary | ICD-10-CM | POA: Diagnosis not present

## 2020-10-03 DIAGNOSIS — L97423 Non-pressure chronic ulcer of left heel and midfoot with necrosis of muscle: Secondary | ICD-10-CM | POA: Diagnosis not present

## 2020-10-03 DIAGNOSIS — M1990 Unspecified osteoarthritis, unspecified site: Secondary | ICD-10-CM | POA: Diagnosis not present

## 2020-10-03 NOTE — Progress Notes (Deleted)
Chronic Care Management Pharmacy Note  10/03/2020 Name:  [name] [name] MRN:  [id number] DOB:  08/28/1955  Summary: ***  Recommendations/Changes made from today's visit: ***  Plan: ***  Subjective: [name] [name] is an 65 y.o. year old male who is a primary patient of Janith Lima, MD.  The CCM team was consulted for assistance with disease management and care coordination needs.    Engaged with patient face to face for initial visit in response to provider referral for pharmacy case management and/or care coordination services.   Consent to Services:  The patient was given the following information about Chronic Care Management services today, agreed to services, and gave verbal consent: 1. CCM service includes personalized support from designated clinical staff supervised by the primary care provider, including individualized plan of care and coordination with other care providers 2. 24/7 contact phone numbers for assistance for urgent and routine care needs. 3. Service will only be billed when office clinical staff spend 20 minutes or more in a month to coordinate care. 4. Only one practitioner may furnish and bill the service in a calendar month. 5.The patient may stop CCM services at any time (effective at the end of the month) by phone call to the office staff. 6. The patient will be responsible for cost sharing (co-pay) of up to 20% of the service fee (after annual deductible is met). Patient agreed to services and consent obtained.  Patient Care Team: Janith Lima, MD as PCP - General (Internal Medicine) O'Neal, Cassie Freer, MD as PCP - Cardiology (Cardiology) Viona Gilmore, [facility] as Pharmacist (Pharmacist) Tomasa Blase, [facility] as Pharmacist (Pharmacist)  Recent office visits: 07/14/2020 - [name] MD - PCP - follow up  - no changes to medications   Recent consult visits: 09/29/2020 - Linton Ham MD - follow up for non-healing wounds - no  medication changes - awaiting appointment with vascular  09/28/2020 Eulis Canner DPM  - debridement of toenails performed 09/22/2020 - Linton Ham MD- follow up for non-healing wounds - x-rays of left heel ordered - no acute findings  09/01/2020 - Linton Ham MD - non healing wound - continuing with hydrofera blue and santyl for now  08/29/2020 - Harold Barban MD -Vascular Surgery - scheduled for angiography to evaluate blood flow  08/18/2020- Linton Ham MD - evaluation of non healing wound - no changes to medications at this time  08/17/2020- Leonia Reader RN  - Nutrition - discussion of insulin pump/CGMs and carb counting - patient decided to proceed to with omnipod insulin pump 08/16/2020 - Renato Shin MD - Endocrinology - planning for insulin pump, medications continued at this time  08/08/2020 - Jeri Cos  - follow up for non-healing wound - no medication changes at this time  07/15/2020 - Renato Shin MD - Endocrinology - novolog reduce to 8 units twice daily and glargine reduced to 15 units daily  06/08/2020 - Rodell Perna MD - [place] Hospital visits: 05/16/2020 - Admitted to [facility] - Discharged 05/24/2020 Admitted to hospital after a fall down 17 stairs - diagnosed with left scapular and coracoid fracture  - discharged to SNF - finish augmentin 875-110m twice daily course  - eliquis held at discharge   Objective:  Lab Results  Component Value Date   CREATININE 0.50 (L) 08/30/2020   BUN 19 08/30/2020   GFR 97.55 07/14/2020   GFRNONAA >60 05/18/2020   GFRAA >60 06/09/2018   NA 133 (L)  08/30/2020   K 4.4 08/30/2020   CALCIUM 9.0 07/14/2020   CO2 27 07/14/2020   GLUCOSE 437 (H) 08/30/2020    Lab Results  Component Value Date/Time   HGBA1C 14.9 (A) 08/16/2020 02:41 PM   HGBA1C 12.0 (H) 07/14/2020 02:35 PM   HGBA1C 12.7 (H) 05/16/2020 08:28 PM   GFR 97.55 07/14/2020 02:35 PM   GFR 95.20 03/16/2020 09:58 AM   MICROALBUR 40.8 (H) 03/16/2020 09:58 AM    MICROALBUR 34.4 (H) 01/06/2019 02:46 PM    Last diabetic Eye exam:  No results found for: HMDIABEYEEXA  Last diabetic Foot exam:  No results found for: HMDIABFOOTEX   Lab Results  Component Value Date   CHOL 123 03/16/2020   HDL 38.60 (L) 03/16/2020   LDLCALC 53 01/06/2019   LDLDIRECT 37.0 03/16/2020   TRIG 222.0 (H) 03/16/2020   CHOLHDL 3 03/16/2020    Hepatic Function Latest Ref Rng & Units 05/18/2020 05/17/2020 03/16/2020  Total Protein 6.5 - 8.1 g/dL 4.7(L) 4.6(L) 6.4  Albumin 3.5 - 5.0 g/dL 2.6(L) 2.7(L) 3.9  AST 15 - 41 U/L 14(L) 16 22  ALT 0 - 44 U/L 12 12 26   Alk Phosphatase 38 - 126 U/L 42 45 57  Total Bilirubin 0.3 - 1.2 mg/dL 0.7 1.1 0.6  Bilirubin, Direct 0.0 - 0.2 mg/dL 0.2 - 0.1    Lab Results  Component Value Date/Time   TSH 2.845 05/16/2020 08:28 PM   TSH 2.80 03/16/2020 09:58 AM   TSH 1.70 01/06/2019 02:46 PM   FREET4 1.23 (H) 05/16/2020 08:28 PM    CBC Latest Ref Rng & Units 08/30/2020 07/14/2020 05/19/2020  WBC 4.0 - 10.5 K/uL - 6.7 5.9  Hemoglobin 13.0 - 17.0 g/dL 13.3 12.7(L) 8.7(L)  Hematocrit 39.0 - 52.0 % 39.0 37.7(L) 25.6(L)  Platelets 150.0 - 400.0 K/uL - 244.0 168    Lab Results  Component Value Date/Time   VD25OH 21.71 (L) 01/06/2019 02:46 PM    Clinical ASCVD: {YES/NO:21197} The ASCVD Risk score (Arnett DK, et al., 2019) failed to calculate for the following reasons:   The valid total cholesterol range is 130 to 320 mg/dL    Depression screen [facility] 2/9 07/14/2020 03/16/2020 10/23/2019  Decreased Interest 0 0 3  Down, Depressed, Hopeless 0 0 3  PHQ - 2 Score 0 0 6  Altered sleeping 0 0 3  Tired, decreased energy 0 0 3  Change in appetite 0 0 3  Feeling bad or failure about yourself  0 0 3  Trouble concentrating 0 0 3  Moving slowly or fidgety/restless 0 0 3  Suicidal thoughts 0 0 3  PHQ-9 Score 0 0 27  Difficult doing work/chores - - Extremely dIfficult  Some recent data might be hidden     ***Other: (CHADS2VASc if Afib, MMRC or CAT  for COPD, ACT, DEXA)  Social History   Tobacco Use  Smoking Status Some Days   Types: Cigars  Smokeless Tobacco Never   BP Readings from Last 3 Encounters:  08/30/20 126/73  08/29/20 105/72  08/16/20 (!) 100/50   Pulse Readings from Last 3 Encounters:  08/30/20 (!) 54  08/29/20 76  08/16/20 60   Wt Readings from Last 3 Encounters:  08/30/20 158 lb (71.7 kg)  08/29/20 158 lb (71.7 kg)  08/16/20 155 lb 12.8 oz (70.7 kg)   BMI Readings from Last 3 Encounters:  08/30/20 20.29 kg/m  08/29/20 20.29 kg/m  08/16/20 20.00 kg/m    Assessment/Interventions: Review of patient past medical history,  allergies, medications, health status, including review of consultants reports, laboratory and other test data, was performed as part of comprehensive evaluation and provision of chronic care management services.   SDOH:  (Social Determinants of Health) assessments and interventions performed: {yes/no:20286}  SDOH Screenings   Alcohol Screen: Low Risk    Last Alcohol Screening Score (AUDIT): 1  Depression (PHQ2-9): Low Risk    PHQ-2 Score: 0  Financial Resource Strain: Unknown   Difficulty of Paying Living Expenses: Patient refused  Food Insecurity: No Food Insecurity   Worried About Charity fundraiser in the Last Year: Never true   Ran Out of Food in the Last Year: Never true  Housing: [place] Risk Score: 0  Physical Activity: Inactive   Days of Exercise per Week: 0 days   Minutes of Exercise per Session: 0 min  Social Connections: Socially Isolated   Frequency of Communication with Friends and Family: More than three times a week   Frequency of Social Gatherings with Friends and Family: More than three times a week   Attends Religious Services: Never   Marine scientist or Organizations: No   Attends Music therapist: Never   Marital Status: Divorced  Stress: Stress Concern Present   Feeling of Stress : Very much  Tobacco Use: High Risk    Smoking Tobacco Use: Some Days   Smokeless Tobacco Use: Never  Transportation Needs: No Transportation Needs   Lack of Transportation (Medical): No   Lack of Transportation (Non-Medical): No    CCM Care Plan  No Known Allergies  Medications Reviewed Today     Reviewed by Bronson Ing, DPM (Physician) on 09/28/20 at [phone]  Med List Status: <None>   Medication Order Taking? Sig Documenting Provider Last Dose Status Informant  apixaban (ELIQUIS) 5 MG TABS tablet [id number]  Take 1 tablet (5 mg total) by mouth 2 (two) times daily. Janith Lima, MD  Active   aspirin EC 325 MG tablet [id number] No Take 325 mg by mouth daily. [provider] 08/28/2020 Active Multiple Informants  atorvastatin (LIPITOR) 40 MG tablet [id number]  Take 1 tablet (40 mg total) by mouth daily. Janith Lima, MD  Active   B Complex Vitamins (B COMPLEX-B12 PO) [id number] No Take 1 tablet by mouth daily.  [provider] 08/29/2020 Active Multiple Informants           Med Note Thomes Cake   Tue Jul 05, 2020  8:41 AM)    Cholecalciferol (VITAMIN D) 125 MCG (5000 UT) CAPS [id number] No Take 5,000 Units by mouth daily.  [provider] 08/29/2020 Active Multiple Informants           Med Note Thomes Cake   Tue Jul 05, 2020  8:41 AM)    clopidogrel (PLAVIX) 75 MG tablet [id number]  Take 1 tablet (75 mg total) by mouth daily. Serafina Mitchell, MD  Active   Continuous Blood Gluc Sensor (DEXCOM G6 SENSOR) MISC [id number] No 1 Device by Does not apply route See admin instructions. Change every 10 days Renato Shin, MD Taking Active   Insulin Aspart FlexPen 100 UNIT/ML SOPN [id number] No Inject 8 Units into the skin 2 (two) times daily with a meal. And pen needles 3/day Renato Shin, MD 08/28/2020 Active   Insulin Disposable Pump (OMNIPOD 5 G6 INTRO, GEN 5,) KIT [id number]  1 Device by Does not apply route every 3 (three) days. Renato Shin, MD  Active   insulin glargine (LANTUS SOLOSTAR)  100 UNIT/ML Solostar Pen [id number] No Inject 20 Units into the skin every morning. Renato Shin, MD 08/28/2020 Active   Insulin Pen Needle (DROPLET PEN NEEDLES) 32G X 4 MM MISC [id number] No Use daily for glucose control Janith Lima, MD Taking Active Multiple Informants  lisinopril (ZESTRIL) 5 MG tablet [id number] No TAKE 1 TABLET BY MOUTH DAILY  Patient not taking: No sig reported   Isaac Bliss, Rayford Halsted, MD not  taking Active            Med Note Marcell Barlow   Tue May 17, 2020  8:47 AM) Non-compliant  mirtazapine (REMERON) 15 MG tablet [id number]  Take 1 tablet (15 mg total) by mouth at bedtime. Janith Lima, MD  Active   PARoxetine (PAXIL) 20 MG tablet [id number]  Take 1 tablet (20 mg total) by mouth daily. Janith Lima, MD  Active   SANTYL ointment [id number] No Apply 1 application topically daily. [provider] Unknown Active   sulfamethoxazole-trimethoprim (BACTRIM DS) 800-160 MG tablet [id number]  Take 1 tablet by mouth 2 (two) times daily. Serafina Mitchell, MD  Active   traZODone (DESYREL) 50 MG tablet [id number] No Take 1 tablet (50 mg total) by mouth at bedtime. Janith Lima, MD More than a month Active Multiple Informants            Patient Active Problem List   Diagnosis Date Noted   Iron deficiency anemia secondary to inadequate dietary iron intake 07/14/2020   Diabetic ulcer of left foot associated with type 1 diabetes mellitus ([place]) 06/30/2020   Pressure injury of skin 05/24/2020   BPH with obstruction/lower urinary tract symptoms 03/16/2020   PAF (paroxysmal atrial fibrillation) ([place]) 03/16/2020   Type 1 diabetes mellitus with diabetic foot infection ([place]) 01/27/2020   Encounter for general adult medical examination with abnormal findings 01/13/2020   Hyperlipidemia LDL goal <70 01/13/2020   Benign prostatic hyperplasia without lower urinary tract symptoms 01/13/2020   PVD (peripheral vascular disease) ([place]) 01/13/2020   Coronary artery  disease involving bypass graft of transplanted heart without angina pectoris 01/13/2020   Paroxysmal atrial fibrillation ([place]) 01/13/2020   Recurrent major depressive disorder, in partial remission ([place]) 01/13/2020   Aortic dissection ([place]) 02/05/2019   Coronary artery disease 02/05/2019   Hypertension 02/05/2019   Psoriasis 02/05/2019   Vitamin D deficiency 01/07/2019   Uncontrolled type 2 diabetes mellitus with hyperglycemia ([place]) 01/07/2019   Microalbuminuria 01/07/2019   Depression 06/10/2018   Patellar tendonitis of right knee 04/24/2018   Peyronie's disease 06/09/2015   Erectile dysfunction 03/15/2015   S/P aortic aneurysm repair 05/19/2012   S/P CABG (coronary artery bypass graft) 05/19/2012   Non-proliferative diabetic retinopathy ([place]) 02/27/2011   Dupuytren's contracture of both hands 11/01/2010    Immunization History  Administered Date(s) Administered   Influenza Split 11/22/2010   Influenza-Unspecified 01/17/2018, 10/31/2019   PFIZER(Purple Top)SARS-COV-2 Vaccination 02/08/2019, 03/04/2019, 10/31/2019   Pneumococcal Conjugate-13 02/17/2018   Pneumococcal Polysaccharide-23 02/27/2011, 01/13/2020   Tdap 02/27/2011, 07/21/2014   Zoster, Live 03/18/2014    Conditions to be addressed/monitored:  {USCCMDZASSESSMENTOPTIONS:23563}  There are no care plans that you recently modified to display for this patient.     Medication Assistance: {MEDASSISTANCEINFO:25044}  Compliance/Adherence/Medication fill history: Care Gaps: ***  Patient's preferred pharmacy is:  [facility] [id number] - [address], [address] [place] [address] Phone: [phone] Fax: [phone]  [facility] (New Address) -  [place], [place] AT Previously: [address], [address] [place] [address] Phone: [phone] Fax:  [phone]   Uses pill box? {Yes or If no, why not?:20788} Pt endorses ***% compliance  Care Plan and Follow Up Patient Decision:  {FOLLOWUP:24991}  Plan: {CM FOLLOW UP JPET:62446}  ***  Current Barriers:  {pharmacybarriers:24917}  Pharmacist Clinical Goal(s):  Patient will {PHARMACYGOALCHOICES:24921} through collaboration with PharmD and provider.   Interventions: 1:1 collaboration with Janith Lima, MD regarding development and update of comprehensive plan of care as evidenced by provider attestation and co-signature Inter-disciplinary care team collaboration (see longitudinal plan of care) Comprehensive medication review performed; medication list updated in electronic medical record  Hypertension (BP goal <140/90) -{US controlled/uncontrolled:25276} -Current treatment: Lisinopril 69m - 1 tablet daily  -Medications previously tried: benazepril, metoprolol succinate   -Current home readings: *** -Current dietary habits: *** -Current exercise habits: *** -{ACTIONS;DENIES/REPORTS:21021675::"Denies"} hypotensive/hypertensive symptoms -Educated on {CCM BP Counseling:25124} -Counseled to monitor BP at home ***, document, and provide log at future appointments -{CCMPHARMDINTERVENTION:25122}  Hyperlipidemia/ Coronary Artery Disease: (LDL goal < 70) -Controlled Lab Results  Component Value Date   LDLCALC 53 01/06/2019  -Current treatment: Atorvastatin 461m- 1 tablet daily  Clopidogrel 7537m 1 tablet daily  Aspirin 325m47m1 tablet daily  -Medications previously tried: ***  -Current dietary patterns: *** -Current exercise habits: *** -Educated on {CCM HLD Counseling:25126} -{CCMPHARMDINTERVENTION:25122}  Atrial Fibrillation (Goal: prevent stroke and major bleeding) -{US controlled/uncontrolled:25276} -CHADSVASC: *** -Current treatment: Rate control: *** Anticoagulation: Eliquis 5mg 12m tablet twice daily  -Medications previously tried: metoprolol succinate   -Home BP and HR readings: ***  -Counseled on {CCMAFIBCOUNSELING:25120} -{CCMPHARMDINTERVENTION:25122}   Diabetes (A1c goal <7%) - follow with Endo - recently started Omnipod pump -Uncontrolled Lab Results  Component Value Date   HGBA1C 14.9 (A) 08/16/2020  -Current medications: Omnipod pump - novolog as directed  -Medications previously tried: glipizide, basaglar, lantus, humulin R, metformin,   -Current home glucose readings fasting glucose: *** post prandial glucose: *** -{ACTIONS;DENIES/REPORTS:21021675::"Denies"} hypoglycemic/hyperglycemic symptoms -Current meal patterns:  breakfast: ***  lunch: ***  dinner: *** snacks: *** drinks: *** -Current exercise: *** -Educated on {CCM DM COUNSELING:25123} -Counseled to check feet daily and get yearly eye exams -{CCMPHARMDINTERVENTION:25122}  Depression/Anxiety (Goal: ***) -{US controlled/uncontrolled:25276} -Current treatment: Mirtazepine 15mg 91mtablet daily at bedtime  Paroxetine 20mg -74mablet daily  Trazodone 50mg - 2mblet at bedtime  -Medications previously tried/failed: fluoxetine -PHQ9: *** -GAD7: *** -Connected with *** for mental health support -Educated on {CCM mental health counseling:25127} -{CCMPHARMDINTERVENTION:25122}  *** (Goal: ***) -{US controlled/uncontrolled:25276} -Current treatment  *** -Medications previously tried: ***  -{CCMPHARMDINTERVENTION:25122}  Health Maintenance -Vaccine gaps: *** -Current therapy:  Vitamin B complex - 1 tablet daily  Vitamin D 5000 units-  1 tablet daily  -Educated on {ccm supplement counseling:25128} -{CCM Patient satisfied:25129} -{CCMPHARMDINTERVENTION:25122}  Patient Goals/Self-Care Activities Patient will:  - {pharmacypatientgoals:24919}  Follow Up Plan: {CM FOLLOW UP PLAN:222XFQH:22575}

## 2020-10-05 ENCOUNTER — Other Ambulatory Visit: Payer: Self-pay | Admitting: Internal Medicine

## 2020-10-05 DIAGNOSIS — E1042 Type 1 diabetes mellitus with diabetic polyneuropathy: Secondary | ICD-10-CM

## 2020-10-06 ENCOUNTER — Other Ambulatory Visit: Payer: Self-pay

## 2020-10-06 ENCOUNTER — Encounter (HOSPITAL_BASED_OUTPATIENT_CLINIC_OR_DEPARTMENT_OTHER): Payer: HMO | Attending: Internal Medicine | Admitting: Internal Medicine

## 2020-10-06 DIAGNOSIS — E1142 Type 2 diabetes mellitus with diabetic polyneuropathy: Secondary | ICD-10-CM | POA: Insufficient documentation

## 2020-10-06 DIAGNOSIS — L97512 Non-pressure chronic ulcer of other part of right foot with fat layer exposed: Secondary | ICD-10-CM | POA: Diagnosis not present

## 2020-10-06 DIAGNOSIS — E1151 Type 2 diabetes mellitus with diabetic peripheral angiopathy without gangrene: Secondary | ICD-10-CM | POA: Insufficient documentation

## 2020-10-06 DIAGNOSIS — E11621 Type 2 diabetes mellitus with foot ulcer: Secondary | ICD-10-CM | POA: Insufficient documentation

## 2020-10-06 DIAGNOSIS — L97511 Non-pressure chronic ulcer of other part of right foot limited to breakdown of skin: Secondary | ICD-10-CM | POA: Insufficient documentation

## 2020-10-06 DIAGNOSIS — L97528 Non-pressure chronic ulcer of other part of left foot with other specified severity: Secondary | ICD-10-CM | POA: Diagnosis not present

## 2020-10-06 NOTE — Progress Notes (Signed)
DEZI, SCHANER ([id number]) Visit Report for 10/06/2020 Arrival Information Details Patient Name: Date of Service: [name] [facility] THA N L. 10/06/2020 8:45 A M Medical Record Number: [id number] Patient Account Number: [id number] Date of Birth/Sex: Treating RN: January 22, 1955 (65 y.o. [name] Primary Care Li Fragoso: [name] Other Clinician: Referring Ceasar Decandia: Treating Lamichael Youkhana/Extender: [name] in Treatment: 3 Visit Information History Since Last Visit Added or deleted any medications: No Patient Arrived: [name] Any new allergies or adverse reactions: No Arrival Time: 08:44 Had a fall or experienced change in No Accompanied By: mother activities of daily living that may affect Transfer Assistance: None risk of falls: Patient Identification Verified: Yes Signs or symptoms of abuse/neglect since last visito No Secondary Verification Process Completed: Yes Hospitalized since last visit: No Patient Requires Transmission-Based Precautions: No Implantable device outside of the clinic excluding No Patient Has Alerts: Yes cellular tissue based products placed in the center Patient Alerts: Patient on Blood Thinner since last visit: Has Dressing in Place as Prescribed: Yes Pain Present Now: No Electronic Signature(s) Signed: 10/06/2020 6:21:03 PM By: Levan Hurst RN, BSN Entered By: Levan Hurst on 10/06/2020 08:54:50 -------------------------------------------------------------------------------- Encounter Discharge Information Details Patient Name: Date of Service: [name] [facility] THA N L. 10/06/2020 8:45 A M Medical Record Number: [id number] Patient Account Number: [id number] Date of Birth/Sex: Treating RN: June 24, 1955 (65 y.o. [name] Primary Care Tinslee Klare: [name] Other Clinician: Referring Jayona Mccaig: Treating Tosha Belgarde/Extender: [name] in Treatment: 13 Encounter Discharge Information Items  Post Procedure Vitals Discharge Condition: Stable Temperature (F): 98.6 Ambulatory Status: Walker Pulse (bpm): 86 Discharge Destination: Home Respiratory Rate (breaths/min): 16 Transportation: Private Auto Blood Pressure (mmHg): 115/74 Accompanied By: mother Schedule Follow-up Appointment: Yes Clinical Summary of Care: Patient Declined Electronic Signature(s) Signed: 10/06/2020 6:21:03 PM By: Levan Hurst RN, BSN Entered By: Levan Hurst on 10/06/2020 09:15:45 -------------------------------------------------------------------------------- Lower Extremity Assessment Details Patient Name: Date of Service: [name] [facility] TH[facility] N L. 10/06/2020 8:45 A M Medical Record Number: [id number] Patient Account Number: [id number] Date of Birth/Sex: Treating RN: May 08, 1955 (65 y.o. [name] Primary Care Donovan Gatchel: [name] Other Clinician: Referring Quadir Muns: Treating Ardean Simonich/Extender: [name] in Treatment: 13 Edema Assessment Assessed: [name]: No] [name]: No] Edema: [Left: No] [Right: No] Calf Left: Right: Point of Measurement: 34 cm From Medial Instep 30.5 cm 30 cm Ankle Left: Right: Point of Measurement: 10 cm From Medial Instep 23.5 cm 22.5 cm Vascular Assessment Pulses: Dorsalis Pedis Palpable: [Left:Yes] [Right:Yes] Electronic Signature(s) Signed: 10/06/2020 6:21:03 PM By: Levan Hurst RN, BSN Entered By: Levan Hurst on 10/06/2020 08:55:25 -------------------------------------------------------------------------------- Multi Wound Chart Details Patient Name: Date of Service: [name] [facility] THA N L. 10/06/2020 8:45 A M Medical Record Number: [id number] Patient Account Number: [id number] Date of Birth/Sex: Treating RN: [birth year] (65 y.o. [name] Primary Care Azam Gervasi: [name] Other Clinician: Referring Dermot Gremillion: Treating Legrande Hao/Extender: [name] in Treatment: 13 Vital  Signs Height(in): 73 Pulse(bpm): 59 Weight(lbs): 151 Blood Pressure(mmHg): 115/74 Body Mass Index(BMI): 19 Temperature(F): 98.6 Respiratory Rate(breaths/min): 16 Photos: [1:Left Calcaneus] [3:Right, Lateral Foot] [N/A:N/A N/A] Wound Location: [1:Pressure Injury] [3:Gradually Appeared] [N/A:N/A] Wounding Event: [1:Pressure Ulcer] [3:Diabetic Wound/Ulcer of the Lower] [N/A:N/A] Primary Etiology: [1:Coronary Artery Disease,] [3:Extremity Coronary Artery Disease,] [N/A:N/A] Comorbid History: [1:Hypertension, Type II Diabetes, Osteoarthritis, Neuropathy 05/23/2020] [3:Hypertension, Type II Diabetes, Osteoarthritis, Neuropathy 06/21/2020] [N/A:N/A] Date Acquired: [1:13] [3:13] [N/A:N/A] Weeks of Treatment: [1:Open] [3:Open] [N/A:N/A] Wound Status: [1:3x2.2x1] [3:1.1x1.2x0.2] [N/A:N/A] Measurements  L x W x D (cm) [1:5.184] [3:1.037] [N/A:N/A] A (cm) : rea [1:5.184] [3:0.207] [N/A:N/A] Volume (cm) : [1:58.70%] [3:48.20%] [N/A:N/A] % Reduction in A [1:rea: -106.30%] [3:48.40%] [N/A:N/A] % Reduction in Volume: [1:4] [3:6] Starting Position 1 (o'clock): [1:5] [3:11] Ending Position 1 (o'clock): [1:0.6] Maximum Distance 1 (cm): [1:Yes] [3:Yes] [N/A:N/A] Undermining: [1:Category/Stage IV] [3:Grade 2] [N/A:N/A] Classification: [1:Medium] [3:Medium] [N/A:N/A] Exudate A mount: [1:Serosanguineous] [3:Serosanguineous] [N/A:N/A] Exudate Type: [1:red, brown] [3:red, brown] [N/A:N/A] Exudate Color: [1:Distinct, outline attached] [3:Well defined, not attached] [N/A:N/A] Wound Margin: [1:Medium (34-66%)] [3:Medium (34-66%)] [N/A:N/A] Granulation A mount: [1:Pink] [3:Pink, Pale] [N/A:N/A] Granulation Quality: [1:Medium (34-66%)] [3:Medium (34-66%)] [N/A:N/A] Necrotic A mount: [1:Eschar, Adherent Slough] [3:Adherent Slough] [N/A:N/A] Necrotic Tissue: [1:Fat Layer (Subcutaneous Tissue): Yes Fat Layer (Subcutaneous Tissue): Yes N/A] Exposed Structures: [1:Bone: Yes Fascia: No Tendon: No Muscle: No Joint:  No Small (1-33%)] [3:Fascia: No Tendon: No Muscle: No Joint: No Bone: No Medium (34-66%)] [N/A:N/A] Epithelialization: [1:Chemical/Enzymatic/Mechanical] [3:Debridement - Excisional] [N/A:N/A] Debridement: Pre-procedure Verification/Time Out 09:15 [3:09:10] [N/A:N/A] Taken: [1:N/A] [3:Callus, Subcutaneous] [N/A:N/A] Tissue Debrided: [1:N/A] [3:Skin/Subcutaneous Tissue] [N/A:N/A] Level: [1:N/A] [3:1.32] [N/A:N/A] Debridement A (sq cm): [1:rea N/A] [3:Curette] [N/A:N/A] Instrument: [1:None] [3:Moderate] [N/A:N/A] Bleeding: [1:N/A] [3:Silver Nitrate] [N/A:N/A] Hemostasis A chieved: [1:0] [3:0] [N/A:N/A] Procedural Pain: [1:0] [3:0] [N/A:N/A] Post Procedural Pain: [1:Procedure was tolerated well] [3:Procedure was tolerated well] [N/A:N/A] Debridement Treatment Response: [1:3x2.2x1] [3:1.1x1.2x0.2] [N/A:N/A] Post Debridement Measurements L x W x D (cm) [1:5.184] [3:0.207] [N/A:N/A] Post Debridement Volume: (cm) [1:Category/Stage IV] [3:N/A] [N/A:N/A] Post Debridement Stage: [1:Debridement] [3:Debridement] [N/A:N/A] Treatment Notes Wound #1 (Calcaneus) Wound Laterality: Left Cleanser Soap and Water Discharge Instruction: May shower and wash wound with dial antibacterial soap and water prior to dressing change. Wound Cleanser Discharge Instruction: Cleanse the wound with wound cleanser prior to applying a clean dressing using gauze sponges, not tissue or cotton balls. Peri-Wound Care Topical Primary Dressing Hydrofera Blue Classic Foam, 4x4 in Discharge Instruction: Moisten with saline prior to applying to wound bed Santyl Ointment Discharge Instruction: Apply nickel thick amount to wound bed as instructed Secondary Dressing Woven Gauze Sponge, Non-Sterile 4x4 in Discharge Instruction: Apply over primary dressing as directed. ALLEVYN Heel 4 1/2in x 5 1/2in / 10.5cm x 13.5cm Discharge Instruction: Apply over primary dressing as directed. Secured With [employer], 4.5x3.1  (in/yd) Discharge Instruction: Secure with Kerlix as directed. 31M Medipore H Soft Cloth Surgical T 4 x 2 (in/yd) ape Discharge Instruction: Secure dressing with tape as directed. Compression Wrap Compression Stockings Add-Ons Wound #3 (Foot) Wound Laterality: Right, Lateral Cleanser Soap and Water Discharge Instruction: May shower and wash wound with dial antibacterial soap and water prior to dressing change. Wound Cleanser Discharge Instruction: Cleanse the wound with wound cleanser prior to applying a clean dressing using gauze sponges, not tissue or cotton balls. Peri-Wound Care Topical Primary Dressing Hydrofera Blue Classic Foam, 4x4 in Discharge Instruction: Moisten with saline prior to applying to wound bed Santyl Ointment Discharge Instruction: Apply nickel thick amount to wound bed as instructed Secondary Dressing Woven Gauze Sponge, Non-Sterile 4x4 in Discharge Instruction: Apply over primary dressing as directed. Secured With [employer], 4.5x3.1 (in/yd) Discharge Instruction: Secure with Kerlix as directed. 31M Medipore H Soft Cloth Surgical T 4 x 2 (in/yd) ape Discharge Instruction: Secure dressing with tape as directed. Compression Wrap Compression Stockings Add-Ons Electronic Signature(s) Signed: 10/06/2020 5:45:05 PM By: Linton Ham MD Signed: 10/06/2020 6:25:33 PM By: [name] RN, BSN Entered By: Linton Ham on 10/06/2020 09:24:43 -------------------------------------------------------------------------------- Multi-Disciplinary Care Plan Details Patient Name: Date of Service: FO  Coral Else [facility] THA N L. 10/06/2020 8:45 A M Medical Record Number: [id number] Patient Account Number: [id number] Date of Birth/Sex: Treating RN: Jan 08, 1955 (65 y.o. [name] Primary Care Cindee Mclester: [name] Other Clinician: Referring Dioselina Brumbaugh: Treating Riannah Stagner/Extender: [name] in Treatment: 13 Active Inactive Necrotic  Tissue Nursing Diagnoses: Impaired tissue integrity related to necrotic/devitalized tissue Knowledge deficit related to management of necrotic/devitalized tissue Goals: Necrotic/devitalized tissue will be minimized in the wound bed Date Initiated: 09/22/2020 Target Resolution Date: 11/04/2020 Goal Status: Active Patient/caregiver will verbalize understanding of reason and process for debridement of necrotic tissue Date Initiated: 09/22/2020 Date Inactivated: 10/06/2020 Target Resolution Date: 10/28/2020 Goal Status: Met Interventions: Provide education on necrotic tissue and debridement process Treatment Activities: Enzymatic debridement : 09/22/2020 Excisional debridement : 09/22/2020 Notes: Pain, Acute or Chronic Nursing Diagnoses: Pain, acute or chronic: actual or potential Potential alteration in comfort, pain Goals: Patient will verbalize adequate pain control and receive pain control interventions during procedures as needed Date Initiated: 07/07/2020 Target Resolution Date: 10/27/2020 Goal Status: Active Patient/caregiver will verbalize comfort level met Date Initiated: 07/07/2020 Date Inactivated: 09/22/2020 Target Resolution Date: 09/30/2020 Goal Status: Met Interventions: Encourage patient to take pain medications as prescribed Provide education on pain management Reposition patient for comfort Treatment Activities: Administer pain control measures as ordered : 07/07/2020 Notes: Wound/Skin Impairment Nursing Diagnoses: Knowledge deficit related to ulceration/compromised skin integrity Goals: Patient/caregiver will verbalize understanding of skin care regimen Date Initiated: 07/07/2020 Target Resolution Date: 10/21/2020 Goal Status: Active Interventions: Assess patient/caregiver ability to obtain necessary supplies Assess patient/caregiver ability to perform ulcer/skin care regimen upon admission and as needed Provide education on ulcer and skin care Treatment  Activities: Skin care regimen initiated : 07/07/2020 Topical wound management initiated : 07/07/2020 Notes: Electronic Signature(s) Signed: 10/06/2020 6:21:03 PM By: Levan Hurst RN, BSN Entered By: Levan Hurst on 10/06/2020 09:08:46 -------------------------------------------------------------------------------- Pain Assessment Details Patient Name: Date of Service: [name] [facility] THA N L. 10/06/2020 8:45 A M Medical Record Number: [id number] Patient Account Number: [id number] Date of Birth/Sex: Treating RN: 1955/11/13 (65 y.o. [name] Primary Care Mansoor Hillyard: [name] Other Clinician: Referring Brieanne Mignone: Treating Carneshia Raker/Extender: [name] in Treatment: 13 Active Problems Location of Pain Severity and Description of Pain Patient Has Paino No Site Locations Pain Management and Medication Current Pain Management: Electronic Signature(s) Signed: 10/06/2020 6:21:03 PM By: Levan Hurst RN, BSN Entered By: Levan Hurst on 10/06/2020 08:55:06 -------------------------------------------------------------------------------- Patient/Caregiver Education Details Patient Name: Date of Service: [name] [facility] THA N L. 10/6/2022andnbsp8:45 A M Medical Record Number: [id number] Patient Account Number: [id number] Date of Birth/Gender: Treating RN: December 16, 1955 (65 y.o. [name] Primary Care Physician: [name] Other Clinician: Referring Physician: Treating Physician/Extender: [name] in Treatment: 71 Education Assessment Education Provided To: Patient Education Topics Provided Wound/Skin Impairment: Methods: Explain/Verbal Responses: State content correctly [employer]) Signed: 10/06/2020 6:21:03 PM By: Levan Hurst RN, BSN Entered By: Levan Hurst on 10/06/2020 09:09:01 -------------------------------------------------------------------------------- Wound Assessment Details Patient  Name: Date of Service: [name] [facility] THA N L. 10/06/2020 8:45 A M Medical Record Number: [id number] Patient Account Number: [id number] Date of Birth/Sex: Treating RN: 01-14-1955 (65 y.o. [name] Primary Care Modesto Ganoe: [name] Other Clinician: Referring Tashia Leiterman: Treating Oaklan Persons/Extender: [name] in Treatment: 13 Wound Status Wound Number: 1 Primary Pressure Ulcer Etiology: Wound Location: Left Calcaneus Wound Open Wounding Event: Pressure Injury Status: Date Acquired: 05/23/2020 Comorbid Coronary Artery Disease, Hypertension, Type II  Diabetes, Weeks Of Treatment: 13 History: Osteoarthritis, Neuropathy Clustered Wound: No Photos Wound Measurements Length: (cm) 3 Width: (cm) 2.2 Depth: (cm) 1 Area: (cm) 5.184 Volume: (cm) 5.184 % Reduction in Area: 58.7% % Reduction in Volume: -106.3% Epithelialization: Small (1-33%) Tunneling: No Undermining: Yes Starting Position (o'clock): 4 Ending Position (o'clock): 5 Maximum Distance: (cm) 0.6 Wound Description Classification: Category/Stage IV Wound Margin: Distinct, outline attached Exudate Amount: Medium Exudate Type: Serosanguineous Exudate Color: red, brown Foul Odor After Cleansing: No Slough/Fibrino Yes Wound Bed Granulation Amount: Medium (34-66%) Exposed Structure Granulation Quality: Pink Fascia Exposed: No Necrotic Amount: Medium (34-66%) Fat Layer (Subcutaneous Tissue) Exposed: Yes Necrotic Quality: Eschar, Adherent Slough Tendon Exposed: No Muscle Exposed: No Joint Exposed: No Bone Exposed: Yes Treatment Notes Wound #1 (Calcaneus) Wound Laterality: Left Cleanser Soap and Water Discharge Instruction: May shower and wash wound with dial antibacterial soap and water prior to dressing change. Wound Cleanser Discharge Instruction: Cleanse the wound with wound cleanser prior to applying a clean dressing using gauze sponges, not tissue or cotton balls. Peri-Wound  Care Topical Primary Dressing Hydrofera Blue Classic Foam, 4x4 in Discharge Instruction: Moisten with saline prior to applying to wound bed Santyl Ointment Discharge Instruction: Apply nickel thick amount to wound bed as instructed Secondary Dressing Woven Gauze Sponge, Non-Sterile 4x4 in Discharge Instruction: Apply over primary dressing as directed. ALLEVYN Heel 4 1/2in x 5 1/2in / 10.5cm x 13.5cm Discharge Instruction: Apply over primary dressing as directed. Secured With [employer], 4.5x3.1 (in/yd) Discharge Instruction: Secure with Kerlix as directed. 62M Medipore H Soft Cloth Surgical T 4 x 2 (in/yd) ape Discharge Instruction: Secure dressing with tape as directed. Compression Wrap Compression Stockings Add-Ons Electronic Signature(s) Signed: 10/06/2020 6:21:03 PM By: Levan Hurst RN, BSN Entered By: Levan Hurst on 10/06/2020 08:52:05 -------------------------------------------------------------------------------- Wound Assessment Details Patient Name: Date of Service: [name] [facility] THA N L. 10/06/2020 8:45 A M Medical Record Number: [id number] Patient Account Number: [id number] Date of Birth/Sex: Treating RN: [birth year] (65 y.o. [name] Primary Care Azalie Harbeck: [name] Other Clinician: Referring Yanai Hobson: Treating Amauria Younts/Extender: [name] in Treatment: 13 Wound Status Wound Number: 3 Primary Diabetic Wound/Ulcer of the Lower Extremity Etiology: Wound Location: Right, Lateral Foot Wound Open Wounding Event: Gradually Appeared Status: Date Acquired: 06/21/2020 Comorbid Coronary Artery Disease, Hypertension, Type II Diabetes, Weeks Of Treatment: 13 History: Osteoarthritis, Neuropathy Clustered Wound: No Photos Wound Measurements Length: (cm) 1.1 Width: (cm) 1.2 Depth: (cm) 0.2 Area: (cm) 1.037 Volume: (cm) 0.207 % Reduction in Area: 48.2% % Reduction in Volume: 48.4% Epithelialization: Medium  (34-66%) Tunneling: No Undermining: Yes Starting Position (o'clock): 6 Ending Position (o'clock): 11 Wound Description Classification: Grade 2 Wound Margin: Well defined, not attached Exudate Amount: Medium Exudate Type: Serosanguineous Exudate Color: red, brown Foul Odor After Cleansing: No Slough/Fibrino Yes Wound Bed Granulation Amount: Medium (34-66%) Exposed Structure Granulation Quality: Pink, Pale Fascia Exposed: No Necrotic Amount: Medium (34-66%) Fat Layer (Subcutaneous Tissue) Exposed: Yes Necrotic Quality: Adherent Slough Tendon Exposed: No Muscle Exposed: No Joint Exposed: No Bone Exposed: No Treatment Notes Wound #3 (Foot) Wound Laterality: Right, Lateral Cleanser Soap and Water Discharge Instruction: May shower and wash wound with dial antibacterial soap and water prior to dressing change. Wound Cleanser Discharge Instruction: Cleanse the wound with wound cleanser prior to applying a clean dressing using gauze sponges, not tissue or cotton balls. Peri-Wound Care Topical Primary Dressing Hydrofera Blue Classic Foam, 4x4 in Discharge Instruction: Moisten with saline prior to applying to wound bed  Santyl Ointment Discharge Instruction: Apply nickel thick amount to wound bed as instructed Secondary Dressing Woven Gauze Sponge, Non-Sterile 4x4 in Discharge Instruction: Apply over primary dressing as directed. Secured With [employer], 4.5x3.1 (in/yd) Discharge Instruction: Secure with Kerlix as directed. 48M Medipore H Soft Cloth Surgical T 4 x 2 (in/yd) ape Discharge Instruction: Secure dressing with tape as directed. Compression Wrap Compression Stockings Add-Ons Electronic Signature(s) Signed: 10/06/2020 6:21:03 PM By: Levan Hurst RN, BSN Entered By: Levan Hurst on 10/06/2020 08:53:14 -------------------------------------------------------------------------------- Vitals Details Patient Name: Date of Service: [name] [facility] THA N L.  10/06/2020 8:45 A M Medical Record Number: [id number] Patient Account Number: [id number] Date of Birth/Sex: Treating RN: 07-26-1955 (65 y.o. [name] Primary Care : [name] Other Clinician: Referring : Treating /Extender: [name] in Treatment: 13 Vital Signs Time Taken: 08:44 Temperature (F): 98.6 Height (in): 74 Pulse (bpm): 86 Weight (lbs): 151 Respiratory Rate (breaths/min): 16 Body Mass Index (BMI): 19.4 Blood Pressure (mmHg): 115/74 Reference Range: 80 - 120 mg / dl Electronic Signature(s) Signed: 10/06/2020 6:21:03 PM By: Levan Hurst RN, BSN Entered By: Levan Hurst on 10/06/2020 08:55:01

## 2020-10-06 NOTE — Progress Notes (Signed)
DARROW, BARREIRO ([id number]) Visit Report for 10/06/2020 Debridement Details Patient Name: Date of Service: [name] [name] [facility] THA N L. 10/06/2020 8:45 A M Medical Record Number: [id number] Patient Account Number: [id number] Date of Birth/Sex: Treating RN: Sep 17, 1955 (65 y.o. [name] Primary Care Provider: [name] Other Clinician: Referring Provider: Treating Provider/Extender: [name] in Treatment: 13 Debridement Performed for Assessment: Wound #1 Left Calcaneus Performed By: Clinician Levan Hurst, RN Debridement Type: Chemical/Enzymatic/Mechanical Agent Used: Santyl Level of Consciousness (Pre-procedure): Awake and Alert Pre-procedure Verification/Time Out Yes - 09:15 Taken: Start Time: 09:15 Bleeding: None Procedural Pain: 0 Post Procedural Pain: 0 Response to Treatment: Procedure was tolerated well Level of Consciousness (Post- Awake and Alert procedure): Post Debridement Measurements of Total Wound Length: (cm) 3 Stage: Category/Stage IV Width: (cm) 2.2 Depth: (cm) 1 Volume: (cm) 5.184 Character of Wound/Ulcer Post Debridement: Requires Further Debridement Post Procedure Diagnosis Same as Pre-procedure Electronic Signature(s) Signed: 10/06/2020 5:45:05 PM By: Linton Ham MD Signed: 10/06/2020 6:21:03 PM By: Levan Hurst RN, BSN Entered By: Levan Hurst on 10/06/2020 09:13:26 -------------------------------------------------------------------------------- Debridement Details Patient Name: Date of Service: [name] [name] [facility] THA N L. 10/06/2020 8:45 A M Medical Record Number: [id number] Patient Account Number: [id number] Date of Birth/Sex: Treating RN: September 17, 1955 (65 y.o. [name] Primary Care Provider: [name] Other Clinician: Referring Provider: Treating Provider/Extender: [name] in Treatment: 13 Debridement Performed for Assessment: Wound #3 Right,Lateral Foot Performed By:  Physician Ricard Dillon., MD Debridement Type: Debridement Severity of Tissue Pre Debridement: Fat layer exposed Level of Consciousness (Pre-procedure): Awake and Alert Pre-procedure Verification/Time Out Yes - 09:10 Taken: Start Time: 09:10 T Area Debrided (L x W): otal 1.1 (cm) x 1.2 (cm) = 1.32 (cm) Tissue and other material debrided: Non-Viable, Callus, Subcutaneous Level: Skin/Subcutaneous Tissue Debridement Description: Excisional Instrument: Curette Bleeding: Moderate Hemostasis Achieved: Silver Nitrate End Time: 09:11 Procedural Pain: 0 Post Procedural Pain: 0 Response to Treatment: Procedure was tolerated well Level of Consciousness (Post- Awake and Alert procedure): Post Debridement Measurements of Total Wound Length: (cm) 1.1 Width: (cm) 1.2 Depth: (cm) 0.2 Volume: (cm) 0.207 Character of Wound/Ulcer Post Debridement: Requires Further Debridement Severity of Tissue Post Debridement: Fat layer exposed Post Procedure Diagnosis Same as Pre-procedure Electronic Signature(s) Signed: 10/06/2020 5:45:05 PM By: Linton Ham MD Signed: 10/06/2020 6:25:33 PM By: [name] RN, BSN Entered By: Linton Ham on 10/06/2020 09:25:08 -------------------------------------------------------------------------------- HPI Details Patient Name: Date of Service: [name] [name] [facility] THA N L. 10/06/2020 8:45 A M Medical Record Number: [id number] Patient Account Number: [id number] Date of Birth/Sex: Treating RN: 06-Nov-1955 (65 y.o. [name] Primary Care Provider: [name] Other Clinician: Referring Provider: Treating Provider/Extender: [name] in Treatment: 4 History of Present Illness HPI Description: ADMISSION 07/07/2020 This is a 65 year old man who is a type II diabetic. I have reviewed this in epic. It appeared that his diabetes was diagnosed in 2009 at which time he was on metformin. He was followed for a period of time by an  endocrinologist with [facility] to always labeled him as a type II diabetic. Recently in epic there is been a switch to type 1 diabetes. If there were defining antibody test done I do not see them and I think the totality of the evidence would suggest that he is a type II diabetic. His recent problem started in May when he fell down 17 stairs. He suffered a scapular fracture he was hospitalized at [facility] for  9 days and then discharged to [facility]. His mother who is present said that most of these wounds seem to develop surrounding the discharge from hospital or the stay at [facility]. He has an area on his lower sacrum which appears currently to be a stage II but per their description this is actually improved quite a bit. He has an area on the left lateral foot at roughly the base of the fifth metatarsal, the left Achilles heel and the right fifth metatarsal head laterally they have been using topical antibiotics. Seen by primary care yesterday and placed on Keflex The patient had arterial studies on 05/05/2020. I wondered whether this had something to do with wounds on his feet at that time but I have not been able to determine this. At that time his ABI on the right was 1.09 with a TBI of 0.65 on the left 0.88 with a TBI of 0.44. Waveforms were triphasic on the right monophasic on the left. He likely has significant PAD on the left Past medical history includes type 2 diabetes poorly controlled last hemoglobin A1c at 12.7 in May, hypertension, hyperlipidemia, atrial fib on Eliquis, coronary artery disease status post CABG, left scapular fracture in May of this year, AAA repair. Bilateral leg weakness ABIs were not done in our clinic as they were just done in May of this year at vein [facility] vascular 7/14; patient arrives back in clinic he has areas on the left Achilles heel, base of the left fifth metatarsal, right lateral fifth metatarsal. The area he had over the coccyx appears to be healed but this  area remains vulnerable. We have been using Iodoflex. I rereviewed his ABIs which were done in May. I do not know exactly what prompted these test however they do show a TBI of 0.44 on the left and monophasic waveforms. 7/21; the patient's wounds are about the same. Certainly no healing and the nurses noted some green drainage on the right fifth metatarsal head, left Achilles a little larger. We have been using silver alginate really without any improvement. We did get an appointment with vascular surgery however its that it did the end of August and they seem to want to repeat the noninvasive test that they have already done in May. I am not sure what the issue here is. We advised the patient's sister to call and point to the idea that they have already had noninvasive studies. The real question I have is does this patient need an angiogram. He is a diabetic and it is possible that the ABIs are falsely elevated because of medial calcification. At least on the left his TBI was abnormal 08/08/2020 upon evaluation today patient appears to be doing about the same in regard to his wounds. He still waiting the appointment with vascular. This is something that hopefully should be undertaken shortly. The 29th of this month is when he is actually scheduled although they have put in a call to move this up if at all possible. Fortunately there does not appear to be any signs of active infection at this time. No fevers, chills, nausea, vomiting, or diarrhea. 8/18; appointment with Dr. Trula Slade on 8/29. The patient has wounds on the left Achilles heel, left lateral foot and the right lateral fifth metatarsal head. We have been using Santyl under Hydrofera Blue. The surface of especially the left heel looks better to me than what I remember. 9/1; since the patient was last here he underwent angiography by Dr. Trula Slade  he also underwent noninvasive studies first showing a ABI of of 0.64 with monophasic waveforms. His  TBI was 0.35 with a left great toe pressure of 46 this is about the same pattern as we saw previously. The left was a lot worse than the right. He underwent an angiogram on 08/29/2020. He had multiple 80% peroneal artery stenosis which was his dominant vessel which was treated with angioplasty with no residual stenosis. 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 The anterior tibial artery was also occluded. An attempt was made at recanalization of the anterior tibial artery however this was unsuccessful. Noted that the peroneal artery reconstitutes the posterior tibial artery and the anterior tibial artery creating the plantar arch. The patient has areas on the Achilles left heel left lateral foot and right lateral fifth metatarsal head we have been using Santyl and Hydrofera Blue 9/22; since the patient was last here he went for repeat vascular studies. On the right his ABI was 0.94 with biphasic waveforms TBI of 0.73 with a great toe pressure of 75. On the left which is the problematic wound his ABI was 1.08 but with biphasic waveforms at the PTA. Biphasic waveforms at the dorsalis pedis his TBI was 0.63 with a great toe pressure of 65. On the left however this does represent a fairly marked improvement. We have been using Santyl and Hydrofera Blue 9/29 left Achilles area. Still is having necrotic surface. Bleeds very easily as the patient is on a combination of Eliquis aspirin and Plavix just stopped yesterday. I am not going to do any debridement until next week. We are using Santyl and Hydrofera Blue. His arterial status on the right was actually quite good. The area on the fifth metatarsal head needs additional debridement. A total contact cast in this area is not out of the question. He does describe claudication I believe with minimal activity. He is not very active X-ray we ordered last week is still pending in terms of the report 10/6 left Achilles area. In terms of the necrotic surface  this is quite a bit better. There is still some debris in the posterior part of the wound however this looks better. Anteriorly the wound appears to be epithelialized there is still a divot in the middle of this but this does not probe to deep tissue. The area on the right fifth metatarsal head laterally really is not making as much progress as I would have liked. Still not completely a viable surface. Both the patient and his wife agree that he is not an external rotator in terms of ambulation [an outie]. We have been using Santyl and Hydrofera Blue for a prolonged period. On the left Achilles which looked like it is worse wound this seems to be making some progress not so much on the right foot. The patient and his wife state that they are rigorous about offloading both of these areas He was treated with angioplasty as I believe on the right peroneal artery that had multiple stenosis. This is a dominant vessel the anterior tibial and posterior tibial arteries are occluded however the foot is fed via collaterals from the peroneal. From my notes I am not able to understand the vascular supply on the left I will have to review this. Electronic Signature(s) Signed: 10/06/2020 5:45:05 PM By: Linton Ham MD Entered By: Linton Ham on 10/06/2020 09:29:31 -------------------------------------------------------------------------------- Physical Exam Details Patient Name: Date of Service: [name] [name] [facility] THA N L. 10/06/2020 8:45 A M Medical Record  Number: [id number] Patient Account Number: [id number] Date of Birth/Sex: Treating RN: Oct 02, 1955 (65 y.o. [name] Primary Care Provider: [name] Other Clinician: Referring Provider: Treating Provider/Extender: [name] in Treatment: 13 Constitutional Sitting or standing Blood Pressure is within target range for patient.. Pulse regular and within target range for patient.Marland Kitchen Respirations regular, non-labored  and within target range.. Temperature is normal and within the target range for the patient.Marland Kitchen Appears in no distress. Notes Wound exam; left Achilles this actually looks improved. On the distal part of the wound there is still some debris but even this looks less ominous. Proximally better looking granulation. He still has a divot but this does not probe to deep structures On the right lateral a smaller wound. Callus around the wound surface and a gritty fibrinous debris on the surface I used a #3 curette to remove the callus and got into the subcutaneous tissue to try and remove the fibrinous debris however the bleeding is so excessive I halted the procedure. Hemostasis with silver nitrate and a pressure dressing Electronic Signature(s) Signed: 10/06/2020 5:45:05 PM By: Linton Ham MD Entered By: Linton Ham on 10/06/2020 09:31:00 -------------------------------------------------------------------------------- Physician Orders Details Patient Name: Date of Service: [name] [name] [facility] THA N L. 10/06/2020 8:45 A M Medical Record Number: [id number] Patient Account Number: [id number] Date of Birth/Sex: Treating RN: July 28, 1955 (65 y.o. [name] Primary Care Provider: [name] Other Clinician: Referring Provider: Treating Provider/Extender: [name] in Treatment: 60 Verbal / Phone Orders: No Diagnosis Coding ICD-10 Coding Code Description E11.621 Type 2 diabetes mellitus with foot ulcer E11.51 Type 2 diabetes mellitus with diabetic peripheral angiopathy without gangrene L97.528 Non-pressure chronic ulcer of other part of left foot with other specified severity L97.511 Non-pressure chronic ulcer of other part of right foot limited to breakdown of skin E11.42 Type 2 diabetes mellitus with diabetic polyneuropathy Follow-up Appointments ppointment in 1 week. - Dr. Dellia Nims Return A Bathing/ Shower/ Hygiene May shower and wash wound with soap and  water. - with dressing changes only. Edema Control - Lymphedema / SCD / Other Elevate legs to the level of the heart or above for 30 minutes daily and/or when sitting, a frequency of: - 3-4 times a day throughout the day. Off-Loading Turn and reposition every 2 hours Other: - float heels while resting in bed or chair. Use a pillow or rolled sheet under legs to offload the heels. Home Health No change in wound care orders this week; continue Home Health for wound care. May utilize formulary equivalent dressing for wound treatment orders unless otherwise specified. Other Home Health Orders/Instructions: - [facility] home health three times a week and family to change all other days. Wound Treatment Wound #1 - Calcaneus Wound Laterality: Left Cleanser: Soap and Water (Home Health) 1 x Per Day/30 Days Discharge Instructions: May shower and wash wound with dial antibacterial soap and water prior to dressing change. Cleanser: Wound Cleanser (Home Health) 1 x Per Day/30 Days Discharge Instructions: Cleanse the wound with wound cleanser prior to applying a clean dressing using gauze sponges, not tissue or cotton balls. Prim Dressing: Hydrofera Blue Classic Foam, 4x4 in (Home Health) 1 x Per Day/30 Days ary Discharge Instructions: Moisten with saline prior to applying to wound bed Prim Dressing: Santyl Ointment ([place]) 1 x Per Day/30 Days ary Discharge Instructions: Apply nickel thick amount to wound bed as instructed Secondary Dressing: Woven Gauze Sponge, Non-Sterile 4x4 in (Home Health) 1 x Per Day/30 Days  Discharge Instructions: Apply over primary dressing as directed. Secondary Dressing: ALLEVYN Heel 4 1/2in x 5 1/2in / 10.5cm x 13.5cm (Home Health) 1 x Per Day/30 Days Discharge Instructions: Apply over primary dressing as directed. Secured With: [employer], 4.5x3.1 (in/yd) (Home Health) 1 x Per Day/30 Days Discharge Instructions: Secure with Kerlix as directed. Secured With: 24M  Medipore H Soft Cloth Surgical T 4 x 2 (in/yd) (Home Health) 1 x Per Day/30 Days ape Discharge Instructions: Secure dressing with tape as directed. Wound #3 - Foot Wound Laterality: Right, Lateral Cleanser: Soap and Water (Home Health) 1 x Per Day/30 Days Discharge Instructions: May shower and wash wound with dial antibacterial soap and water prior to dressing change. Cleanser: Wound Cleanser (Home Health) 1 x Per Day/30 Days Discharge Instructions: Cleanse the wound with wound cleanser prior to applying a clean dressing using gauze sponges, not tissue or cotton balls. Prim Dressing: Hydrofera Blue Classic Foam, 4x4 in (Home Health) 1 x Per Day/30 Days ary Discharge Instructions: Moisten with saline prior to applying to wound bed Prim Dressing: Santyl Ointment ([place]) 1 x Per Day/30 Days ary Discharge Instructions: Apply nickel thick amount to wound bed as instructed Secondary Dressing: Woven Gauze Sponge, Non-Sterile 4x4 in (Home Health) 1 x Per Day/30 Days Discharge Instructions: Apply over primary dressing as directed. Secured With: [employer], 4.5x3.1 (in/yd) (Home Health) 1 x Per Day/30 Days Discharge Instructions: Secure with Kerlix as directed. Secured With: 24M Medipore H Soft Cloth Surgical T 4 x 2 (in/yd) (Home Health) 1 x Per Day/30 Days ape Discharge Instructions: Secure dressing with tape as directed. Electronic Signature(s) Signed: 10/06/2020 5:45:05 PM By: Linton Ham MD Signed: 10/06/2020 6:21:03 PM By: Levan Hurst RN, BSN Entered By: Levan Hurst on 10/06/2020 09:11:20 -------------------------------------------------------------------------------- Problem List Details Patient Name: Date of Service: [name] [name] [facility] THA N L. 10/06/2020 8:45 A M Medical Record Number: [id number] Patient Account Number: [id number] Date of Birth/Sex: Treating RN: 1955-10-10 (65 y.o. [name] Primary Care Provider: [name] Other Clinician: Referring  Provider: Treating Provider/Extender: [name] in Treatment: 13 Active Problems ICD-10 Encounter Code Description Active Date MDM Diagnosis E11.621 Type 2 diabetes mellitus with foot ulcer 07/07/2020 No Yes E11.51 Type 2 diabetes mellitus with diabetic peripheral angiopathy without gangrene 09/01/2020 No Yes L97.528 Non-pressure chronic ulcer of other part of left foot with other specified 07/07/2020 No Yes severity L97.511 Non-pressure chronic ulcer of other part of right foot limited to breakdown of 07/07/2020 No Yes skin E11.42 Type 2 diabetes mellitus with diabetic polyneuropathy 07/07/2020 No Yes Inactive Problems ICD-10 Code Description Active Date Inactive Date L89.152 Pressure ulcer of sacral region, stage 2 07/07/2020 07/07/2020 W09.811 Pressure ulcer of left heel, stage 2 07/07/2020 07/07/2020 Resolved Problems Electronic Signature(s) Signed: 10/06/2020 5:45:05 PM By: Linton Ham MD Entered By: Linton Ham on 10/06/2020 09:24:35 -------------------------------------------------------------------------------- Progress Note Details Patient Name: Date of Service: [name] [name] [facility] THA N L. 10/06/2020 8:45 A M Medical Record Number: [id number] Patient Account Number: [id number] Date of Birth/Sex: Treating RN: 16-Oct-1955 (65 y.o. [name] Primary Care Provider: [name] Other Clinician: Referring Provider: Treating Provider/Extender: [name] in Treatment: 13 Subjective History of Present Illness (HPI) ADMISSION 07/07/2020 This is a 65 year old man who is a type II diabetic. I have reviewed this in epic. It appeared that his diabetes was diagnosed in 2009 at which time he was on metformin. He was followed for a period of time by  an endocrinologist with [facility] to always labeled him as a type II diabetic. Recently in epic there is been a switch to type 1 diabetes. If there were defining antibody test done I do not see them  and I think the totality of the evidence would suggest that he is a type II diabetic. His recent problem started in May when he fell down 17 stairs. He suffered a scapular fracture he was hospitalized at [facility] for 9 days and then discharged to [facility]. His mother who is present said that most of these wounds seem to develop surrounding the discharge from hospital or the stay at [facility]. He has an area on his lower sacrum which appears currently to be a stage II but per their description this is actually improved quite a bit. He has an area on the left lateral foot at roughly the base of the fifth metatarsal, the left Achilles heel and the right fifth metatarsal head laterally they have been using topical antibiotics. Seen by primary care yesterday and placed on Keflex The patient had arterial studies on 05/05/2020. I wondered whether this had something to do with wounds on his feet at that time but I have not been able to determine this. At that time his ABI on the right was 1.09 with a TBI of 0.65 on the left 0.88 with a TBI of 0.44. Waveforms were triphasic on the right monophasic on the left. He likely has significant PAD on the left Past medical history includes type 2 diabetes poorly controlled last hemoglobin A1c at 12.7 in May, hypertension, hyperlipidemia, atrial fib on Eliquis, coronary artery disease status post CABG, left scapular fracture in May of this year, AAA repair. Bilateral leg weakness ABIs were not done in our clinic as they were just done in May of this year at vein and vascular 7/14; patient arrives back in clinic he has areas on the left Achilles heel, base of the left fifth metatarsal, right lateral fifth metatarsal. The area he had over the coccyx appears to be healed but this area remains vulnerable. We have been using Iodoflex. I rereviewed his ABIs which were done in May. I do not know exactly what prompted these test however they do show a TBI of 0.44 on the left  and monophasic waveforms. 7/21; the patient's wounds are about the same. Certainly no healing and the nurses noted some green drainage on the right fifth metatarsal head, left Achilles a little larger. We have been using silver alginate really without any improvement. We did get an appointment with vascular surgery however its that it did the end of August and they seem to want to repeat the noninvasive test that they have already done in May. I am not sure what the issue here is. We advised the patient's sister to call and point to the idea that they have already had noninvasive studies. The real question I have is does this patient need an angiogram. He is a diabetic and it is possible that the ABIs are falsely elevated because of medial calcification. At least on the left his TBI was abnormal 08/08/2020 upon evaluation today patient appears to be doing about the same in regard to his wounds. He still waiting the appointment with vascular. This is something that hopefully should be undertaken shortly. The 29th of this month is when he is actually scheduled although they have put in a call to move this up if at all possible. Fortunately there does not appear to  be any signs of active infection at this time. No fevers, chills, nausea, vomiting, or diarrhea. 8/18; appointment with Dr. Trula Slade on 8/29. The patient has wounds on the left Achilles heel, left lateral foot and the right lateral fifth metatarsal head. We have been using Santyl under Hydrofera Blue. The surface of especially the left heel looks better to me than what I remember. 9/1; since the patient was last here he underwent angiography by Dr. Trula Slade he also underwent noninvasive studies first showing a ABI of of 0.64 with monophasic waveforms. His TBI was 0.35 with a left great toe pressure of 46 this is about the same pattern as we saw previously. The left was a lot worse than the right. He underwent an angiogram on 08/29/2020. He had  multiple 80% peroneal artery stenosis which was his dominant vessel which was treated with angioplasty with no residual stenosis. 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 The anterior tibial artery was also occluded. An attempt was made at recanalization of the anterior tibial artery however this was unsuccessful. Noted that the peroneal artery reconstitutes the posterior tibial artery and the anterior tibial artery creating the plantar arch. The patient has areas on the Achilles left heel left lateral foot and right lateral fifth metatarsal head we have been using Santyl and Hydrofera Blue 9/22; since the patient was last here he went for repeat vascular studies. On the right his ABI was 0.94 with biphasic waveforms TBI of 0.73 with a great toe pressure of 75. On the left which is the problematic wound his ABI was 1.08 but with biphasic waveforms at the PTA. Biphasic waveforms at the dorsalis pedis his TBI was 0.63 with a great toe pressure of 65. On the left however this does represent a fairly marked improvement. We have been using Santyl and Hydrofera Blue 9/29 left Achilles area. Still is having necrotic surface. Bleeds very easily as the patient is on a combination of Eliquis aspirin and Plavix just stopped yesterday. I am not going to do any debridement until next week. We are using Santyl and Hydrofera Blue. His arterial status on the right was actually quite good. The area on the fifth metatarsal head needs additional debridement. A total contact cast in this area is not out of the question. He does describe claudication I believe with minimal activity. He is not very active X-ray we ordered last week is still pending in terms of the report 10/6 left Achilles area. In terms of the necrotic surface this is quite a bit better. There is still some debris in the posterior part of the wound however this looks better. Anteriorly the wound appears to be epithelialized there is still a divot  in the middle of this but this does not probe to deep tissue. The area on the right fifth metatarsal head laterally really is not making as much progress as I would have liked. Still not completely a viable surface. Both the patient and his wife agree that he is not an external rotator in terms of ambulation [an outie]. We have been using Santyl and Hydrofera Blue for a prolonged period. On the left Achilles which looked like it is worse wound this seems to be making some progress not so much on the right foot. The patient and his wife state that they are rigorous about offloading both of these areas He was treated with angioplasty as I believe on the right peroneal artery that had multiple stenosis. This is a dominant vessel the  anterior tibial and posterior tibial arteries are occluded however the foot is fed via collaterals from the peroneal. From my notes I am not able to understand the vascular supply on the left I will have to review this. Objective Constitutional Sitting or standing Blood Pressure is within target range for patient.. Pulse regular and within target range for patient.Marland Kitchen Respirations regular, non-labored and within target range.. Temperature is normal and within the target range for the patient.Marland Kitchen Appears in no distress. Vitals Time Taken: 8:44 AM, Height: 74 in, Weight: 151 lbs, BMI: 19.4, Temperature: 98.6 F, Pulse: 86 bpm, Respiratory Rate: 16 breaths/min, Blood Pressure: 115/74 mmHg. General Notes: Wound exam; left Achilles this actually looks improved. On the distal part of the wound there is still some debris but even this looks less ominous. Proximally better looking granulation. He still has a divot but this does not probe to deep structures oo On the right lateral a smaller wound. Callus around the wound surface and a gritty fibrinous debris on the surface I used a #3 curette to remove the callus and got into the subcutaneous tissue to try and remove the fibrinous  debris however the bleeding is so excessive I halted the procedure. Hemostasis with silver nitrate and a pressure dressing Integumentary (Hair, Skin) Wound #1 status is Open. Original cause of wound was Pressure Injury. The date acquired was: 05/23/2020. The wound has been in treatment 13 weeks. The wound is located on the Left Calcaneus. The wound measures 3cm length x 2.2cm width x 1cm depth; 5.184cm^2 area and 5.184cm^3 volume. There is bone and Fat Layer (Subcutaneous Tissue) exposed. There is no tunneling noted, however, there is undermining starting at 4:00 and ending at 5:00 with a maximum distance of 0.6cm. There is a medium amount of serosanguineous drainage noted. The wound margin is distinct with the outline attached to the wound base. There is medium (34-66%) pink granulation within the wound bed. There is a medium (34-66%) amount of necrotic tissue within the wound bed including Eschar and Adherent Slough. Wound #3 status is Open. Original cause of wound was Gradually Appeared. The date acquired was: 06/21/2020. The wound has been in treatment 13 weeks. The wound is located on the Right,Lateral Foot. The wound measures 1.1cm length x 1.2cm width x 0.2cm depth; 1.037cm^2 area and 0.207cm^3 volume. There is Fat Layer (Subcutaneous Tissue) exposed. There is no tunneling noted, however, there is undermining starting at 6:00 and ending at 11:00. There is a medium amount of serosanguineous drainage noted. The wound margin is well defined and not attached to the wound base. There is medium (34-66%) pink, pale granulation within the wound bed. There is a medium (34-66%) amount of necrotic tissue within the wound bed including Adherent Slough. 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 There was a Chemical/Enzymatic/Mechanical debridement performed by Levan Hurst, RN.Marland Kitchen Agent used was [employer]. A time out was conducted at 09:15, prior to the start of the procedure. There was no bleeding. The procedure was tolerated well with a pain level of 0 throughout and a pain level of 0 following the procedure. Post Debridement Measurements:  3cm length x 2.2cm width x 1cm depth; 5.184cm^3 volume. Post debridement Stage noted as Category/Stage IV. Character of Wound/Ulcer Post Debridement requires further debridement. Post procedure Diagnosis Wound #1: Same as Pre-Procedure Wound #3 Pre-procedure diagnosis of Wound #3 is a Diabetic Wound/Ulcer of the Lower Extremity located on the Right,Lateral Foot .Severity of Tissue Pre Debridement is: Fat layer exposed. There was a Excisional Skin/Subcutaneous Tissue Debridement with a total area of 1.32 sq cm performed by Ricard Dillon., MD. With the following instrument(s): Curette to remove Non-Viable tissue/material. Material removed includes Callus and Subcutaneous Tissue and. No specimens were taken. A time out was conducted at 09:10, prior to the start of the procedure. A Moderate amount of bleeding was controlled with Silver Nitrate. The procedure was tolerated well with a pain level of 0 throughout and a pain level of 0 following the procedure. Post Debridement Measurements: 1.1cm length x 1.2cm width x 0.2cm depth; 0.207cm^3 volume. Character of Wound/Ulcer Post Debridement requires further debridement. Severity of Tissue Post Debridement is: Fat layer exposed. Post procedure Diagnosis Wound #3: Same as Pre-Procedure Plan Follow-up Appointments: Return Appointment in 1 week. - Dr. Dellia Nims Bathing/ Shower/  Hygiene: May shower and wash wound with soap and water. - with dressing changes only. Edema Control - Lymphedema / SCD / Other: Elevate legs to the level of the heart or above for 30 minutes daily and/or when sitting, a frequency of: - 3-4 times a day throughout the day. Off-Loading: Turn and reposition every 2 hours Other: - float heels while resting in bed or chair. Use a pillow or rolled sheet under legs to offload the heels. Home Health: No change in wound care orders this week; continue Home Health for wound care. May utilize formulary equivalent dressing for wound treatment orders unless otherwise specified. Other Home Health Orders/Instructions: - [facility] home health three times a week and family to change all other days. WOUND #1: - Calcaneus Wound Laterality: Left Cleanser: Soap and Water (Home Health) 1 x Per Day/30 Days Discharge Instructions: May shower and wash wound with dial antibacterial soap and water prior to dressing change. Cleanser: Wound Cleanser (Home Health) 1 x Per Day/30 Days Discharge Instructions: Cleanse the wound with wound cleanser prior to applying a clean dressing using gauze sponges, not tissue or cotton balls. Prim Dressing: Hydrofera Blue Classic Foam, 4x4 in (Home Health) 1 x Per Day/30 Days ary Discharge Instructions: Moisten with saline prior to applying to wound bed Prim Dressing: Santyl Ointment ([place]) 1 x Per Day/30 Days ary Discharge Instructions: Apply nickel thick amount to wound bed as instructed Secondary Dressing: Woven Gauze Sponge, Non-Sterile 4x4 in (Home Health) 1 x Per Day/30 Days Discharge Instructions: Apply over primary dressing as directed. Secondary Dressing: ALLEVYN Heel 4 1/2in x 5 1/2in / 10.5cm x 13.5cm (Home Health) 1 x Per Day/30 Days Discharge Instructions: Apply over primary dressing as directed. Secured With: [employer], 4.5x3.1 (in/yd) (Home Health) 1 x Per Day/30 Days Discharge Instructions: Secure with  Kerlix as directed. Secured With: 18M Medipore H Soft Cloth Surgical T 4 x 2 (in/yd) (Home Health) 1 x Per Day/30 Days ape Discharge Instructions: Secure dressing with tape as directed. WOUND #3: - Foot Wound Laterality: Right, Lateral Cleanser: Soap and Water (Home Health) 1 x Per Day/30 Days Discharge Instructions: May shower and wash wound with dial antibacterial soap and water prior to dressing change. Cleanser: Wound Cleanser (Home Health) 1 x Per Day/30 Days Discharge Instructions: Cleanse the wound with wound  cleanser prior to applying a clean dressing using gauze sponges, not tissue or cotton balls. Prim Dressing: Hydrofera Blue Classic Foam, 4x4 in (Home Health) 1 x Per Day/30 Days ary Discharge Instructions: Moisten with saline prior to applying to wound bed Prim Dressing: Santyl Ointment ([place]) 1 x Per Day/30 Days ary Discharge Instructions: Apply nickel thick amount to wound bed as instructed Secondary Dressing: Woven Gauze Sponge, Non-Sterile 4x4 in (Home Health) 1 x Per Day/30 Days Discharge Instructions: Apply over primary dressing as directed. Secured With: [employer], 4.5x3.1 (in/yd) (Home Health) 1 x Per Day/30 Days Discharge Instructions: Secure with Kerlix as directed. Secured With: 22M Medipore H Soft Cloth Surgical T 4 x 2 (in/yd) (Home Health) 1 x Per Day/30 Days ape Discharge Instructions: Secure dressing with tape as directed. 1. I have continued with Hydrofera Blue and Santyl on both wounds. I am continuing this because of improvement in the left Achilles. 2. Not so much improvement on the right lateral fifth metatarsal head. 3. I may need to consider an alternative debriding agent such as Iodoflex or Sorbact. 4. I will also consider MolecuLight especially on the right fifth met head. Of course all of this could be ischemic. 5. I will need to update my notes about the blood flow on the left and whether or not there were procedures done by Dr.  Trula Slade Electronic Signature(s) Signed: 10/06/2020 5:45:05 PM By: Linton Ham MD Entered By: Linton Ham on 10/06/2020 09:32:21 -------------------------------------------------------------------------------- SuperBill Details Patient Name: Date of Service: [name] [name] HNA THA N L. 10/06/2020 Medical Record Number: [id number] Patient Account Number: [id number] Date of Birth/Sex: Treating RN: September 19, 1955 (65 y.o. [name], [name] Primary Care Provider: [name] Other Clinician: Referring Provider: Treating Provider/Extender: [name] in Treatment: 13 Diagnosis Coding ICD-10 Codes Code Description [phone] Type 2 diabetes mellitus with foot ulcer E11.51 Type 2 diabetes mellitus with diabetic peripheral angiopathy without gangrene L97.528 Non-pressure chronic ulcer of other part of left foot with other specified severity L97.511 Non-pressure chronic ulcer of other part of right foot limited to breakdown of skin E11.42 Type 2 diabetes mellitus with diabetic polyneuropathy Facility Procedures CPT4 Code: [id number] Description: [phone] - DEBRIDE W/O ANES NON SELECT Modifier: Quantity: 1 CPT4 Code: [id number] Description: [place] - DEB SUBQ TISSUE 20 SQ CM/< ICD-10 Diagnosis Description L97.511 Non-pressure chronic ulcer of other part of right foot limited to breakdown of E11.621 Type 2 diabetes mellitus with foot ulcer Modifier: skin Quantity: 1 Physician Procedures : CPT4 Code Description Modifier [id number] - WC PHYS SUBQ TISS 20 SQ CM ICD-10 Diagnosis Description L97.511 Non-pressure chronic ulcer of other part of right foot limited to breakdown of skin E11.621 Type 2 diabetes mellitus with foot ulcer Quantity: 1 Electronic Signature(s) Signed: 10/06/2020 5:45:05 PM By: Linton Ham MD Entered By: Linton Ham on 10/06/2020 09:32:39

## 2020-10-08 ENCOUNTER — Other Ambulatory Visit: Payer: Self-pay

## 2020-10-08 DIAGNOSIS — I7025 Atherosclerosis of native arteries of other extremities with ulceration: Secondary | ICD-10-CM

## 2020-10-11 ENCOUNTER — Other Ambulatory Visit (HOSPITAL_COMMUNITY): Payer: Self-pay

## 2020-10-11 ENCOUNTER — Ambulatory Visit (INDEPENDENT_AMBULATORY_CARE_PROVIDER_SITE_OTHER): Payer: HMO | Admitting: Endocrinology

## 2020-10-11 ENCOUNTER — Telehealth: Payer: Self-pay | Admitting: Pharmacy Technician

## 2020-10-11 ENCOUNTER — Other Ambulatory Visit: Payer: Self-pay

## 2020-10-11 VITALS — BP 160/80 | HR 72 | Ht 74.0 in | Wt 151.0 lb

## 2020-10-11 DIAGNOSIS — E1165 Type 2 diabetes mellitus with hyperglycemia: Secondary | ICD-10-CM | POA: Diagnosis not present

## 2020-10-11 DIAGNOSIS — E1042 Type 1 diabetes mellitus with diabetic polyneuropathy: Secondary | ICD-10-CM | POA: Diagnosis not present

## 2020-10-11 LAB — POCT GLYCOSYLATED HEMOGLOBIN (HGB A1C): HbA1c POC (<> result, manual entry): >15 % (ref 4.0–5.6)

## 2020-10-11 MED ORDER — LANTUS SOLOSTAR 100 UNIT/ML ~~LOC~~ SOPN: 30.0000 [IU] | PEN_INJECTOR | SUBCUTANEOUS | 99 refills | Status: DC

## 2020-10-11 MED ORDER — INSULIN ASPART FLEXPEN 100 UNIT/ML ~~LOC~~ SOPN: 4.0000 [IU] | PEN_INJECTOR | Freq: Two times a day (BID) | SUBCUTANEOUS | 3 refills | Status: DC

## 2020-10-11 NOTE — Patient Instructions (Addendum)
check your blood sugar twice a day.  vary the time of day when you check, between before the 3 meals, and at bedtime.  also check if you have symptoms of your blood sugar being too high or too low.  please keep a record of the readings and bring it to your next appointment here (or you can bring the meter itself).  You can write it on any piece of paper.  please call us sooner if your blood sugar goes below 70, or if most of your readings are over 200. We will need to take this complex situation in stages.   Fpor now, please increase the Lantus to 30 units each morning, and reduce the Novolog to 4 units twice a day, with meals.  I will do the form for the Omnipods when I receive it.   Please come back for a follow-up appointment in 1 month.

## 2020-10-11 NOTE — Progress Notes (Signed)
Subjective:    Patient ID: [name] [name], male    DOB: March 28, 1955, 65 y.o.   MRN: [id number]  HPI Pt returns for f/u of diabetes mellitus: DM type: Insulin-requiring type 2 (due to lean body habitus: he is at risk for evolving type 1) Dx'ed: 2197 Complications: GP, CAD, PN, PAD, foot ulcer, and DR Therapy: insulin since 2013 DKA: never Severe hypoglycemia: never Pancreatitis: never Pancreatic imaging: normal on 2020 CT SDOH: Sister provides some hx, due to pt's poor health; Other: He does not use continuous glucose monitor; he stopped multiple daily injections, due to missing doses.   Interval history:  Pt says he occasionally misses the insulin shots. He broke CGM reader.  no cbg record, but states cbg varies from 275-496.  There is no trend throughout the day.  He will start pump rx soon.  He takes lantus 15-20 units per day.  Pt says he sometimes misses the insulin, due to memory loss.  Sister wants [place] to send rx to CGM rx to [facility], [name], or [facility].  Past Medical History:  Diagnosis Date   Anxiety    Arthritis    Constipation    pt states goes EOD- hard stools    Coronary artery disease    Depression    Diabetes mellitus without complication (HCC)    DKA (diabetic ketoacidoses)    Hyperlipidemia    Hypertension    off meds   Psoriasis     Past Surgical History:  Procedure Laterality Date   ABDOMINAL AORTOGRAM W/LOWER EXTREMITY N/A 08/30/2020   Procedure: ABDOMINAL AORTOGRAM W/LOWER EXTREMITY;  Surgeon: Serafina Mitchell, MD;  Location: [facility];  Service: Cardiovascular;  Laterality: N/A;   ANKLE SURGERY     right   CARDIAC CATHETERIZATION  09/25/2001   COLONOSCOPY     ~10 yr ago- normal  per pt    CORONARY ARTERY BYPASS GRAFT  09/30/2001   CABG x 4   IR THORACENTESIS ASP PLEURAL SPACE W/IMG GUIDE  05/19/2020   LAPAROSCOPIC INGUINAL HERNIA REPAIR Bilateral 02/09/2010   MASS EXCISION Left 03/12/2013   Procedure: LEFT INDEX EXCISION MASS AND DEBRIDMENT  DISTAL INTERPHALANGEAL JOINT;  Surgeon: Tennis Must, MD;  Location: [facility];  Service: Orthopedics;  Laterality: Left;   PERIPHERAL VASCULAR BALLOON ANGIOPLASTY Left 08/30/2020   Procedure: PERIPHERAL VASCULAR BALLOON ANGIOPLASTY;  Surgeon: Serafina Mitchell, MD;  Location: [facility];  Service: Cardiovascular;  Laterality: Left;  Peroneal    Social History   Socioeconomic History   Marital status: Divorced    Spouse name: Not on file   Number of children: 4   Years of education: 12   Highest education level: 12th grade  Occupational History   Occupation: Retired  Tobacco Use   Smoking status: Some Days    Types: Cigars   Smokeless tobacco: Never  Vaping Use   Vaping Use: Never used  Substance and Sexual Activity   Alcohol use: Yes    Comment: daily beer or scotch    Drug use: No   Sexual activity: Yes  Other Topics Concern   Not on file  Social History Narrative   Lives with his adult son [name].   Has 4 children, 2 sons- both live in [place], 2 daughters one lives in [place], [place] and youngest daughter attends [employer].    Fiance lives in  [place], [place]   Social Determinants of Health   Financial Resource Strain: Unknown  Difficulty of Paying Living Expenses: Patient refused  Food Insecurity: No Food Insecurity   Worried About Running Out of Food in the Last Year: Never true   Ran Out of Food in the Last Year: Never true  Transportation Needs: No Transportation Needs   Lack of Transportation (Medical): No   Lack of Transportation (Non-Medical): No  Physical Activity: Inactive   Days of Exercise per Week: 0 days   Minutes of Exercise per Session: 0 min  Stress: Stress Concern Present   Feeling of Stress : Very much  Social Connections: Socially Isolated   Frequency of Communication with Friends and Family: More than three times a week   Frequency of Social Gatherings with Friends and Family: More than three times a  week   Attends Religious Services: Never   Marine scientist or Organizations: No   Attends Music therapist: Never   Marital Status: Divorced  Human resources officer Violence: Not At Risk   Fear of Current or Ex-Partner: No   Emotionally Abused: No   Physically Abused: No   Sexually Abused: No    Current Outpatient Medications on File Prior to Visit  Medication Sig Dispense Refill   apixaban (ELIQUIS) 5 MG TABS tablet Take 1 tablet (5 mg total) by mouth 2 (two) times daily. 60 tablet 2   aspirin EC 325 MG tablet Take 325 mg by mouth daily.     atorvastatin (LIPITOR) 40 MG tablet Take 1 tablet (40 mg total) by mouth daily. 90 tablet 1   B Complex Vitamins (B COMPLEX-B12 PO) Take 1 tablet by mouth daily.      BD PEN NEEDLE NANO 2ND GEN 32G X 4 MM MISC USE FOR INSULIN 300 each 1   Cholecalciferol (VITAMIN D) 125 MCG (5000 UT) CAPS Take 5,000 Units by mouth daily.      Continuous Blood Gluc Sensor (DEXCOM G6 SENSOR) MISC 1 Device by Does not apply route See admin instructions. Change every 10 days 9 each 3   Insulin Disposable Pump (OMNIPOD 5 G6 INTRO, GEN 5,) KIT 1 Device by Does not apply route every 3 (three) days. 30 kit 3   lisinopril (ZESTRIL) 5 MG tablet TAKE 1 TABLET BY MOUTH DAILY 90 tablet 1   mirtazapine (REMERON) 15 MG tablet Take 1 tablet (15 mg total) by mouth at bedtime. 90 tablet 1   PARoxetine (PAXIL) 20 MG tablet Take 1 tablet (20 mg total) by mouth daily. 90 tablet 1   SANTYL ointment Apply 1 application topically daily.     traZODone (DESYREL) 50 MG tablet Take 1 tablet (50 mg total) by mouth at bedtime. 90 tablet 1   No current facility-administered medications on file prior to visit.    No Known Allergies  Family History  Problem Relation Age of Onset   Lymphoma Father    Cancer Paternal Grandmother        Colon Cancer   Colon cancer Paternal Grandmother    Colon polyps Neg Hx    Esophageal cancer Neg Hx    Rectal cancer Neg Hx    Stomach  cancer Neg Hx    Diabetes Mellitus I Neg Hx     BP (!) 160/80 (BP Location: Right Arm, Patient Position: Sitting, Cuff Size: Normal)   Pulse 72   Ht 6' 2"  (1.88 m)   Wt 151 lb (68.5 kg)   SpO2 98%   BMI 19.39 kg/m    Review of Systems     Objective:  Physical Exam VITAL SIGNS:  See vs page GENERAL: no distress.  In [place].    Lab Results  Component Value Date   HGBA1C >15 10/11/2020      Assessment & Plan:  Insulin-requiring type 2 DM: severe exacerbation. He needs to carefully follow glucose, as changing to pump rx might suddenly decrease insulin--risk for hypoglycemia.   Patient Instructions  check your blood sugar twice a day.  vary the time of day when you check, between before the 3 meals, and at bedtime.  also check if you have symptoms of your blood sugar being too high or too low.  please keep a record of the readings and bring it to your next appointment here (or you can bring the meter itself).  You can write it on any piece of paper.  please call us sooner if your blood sugar goes below 70, or if most of your readings are over 200. We will need to take this complex situation in stages.   Fpor now, please increase the Lantus to 30 units each morning, and reduce the Novolog to 4 units twice a day, with meals.  I will do the form for the Omnipods when I receive it.   Please come back for a follow-up appointment in 1 month.

## 2020-10-11 NOTE — Telephone Encounter (Signed)
Patient Advocate Encounter   Received notification from [facility]  that prior authorization for OMNIPOD 5  is required.   PA submitted on ? Waiting on MD signature (faxed to clinic on 10.11.22)  Status is pending    [facility] will continue to follow   Burney Gauze CPhT Patient [facility] Phone: [phone] Fax:  [phone]

## 2020-10-12 DIAGNOSIS — M720 Palmar fascial fibromatosis [Dupuytren]: Secondary | ICD-10-CM | POA: Diagnosis not present

## 2020-10-12 DIAGNOSIS — K5904 Chronic idiopathic constipation: Secondary | ICD-10-CM | POA: Diagnosis not present

## 2020-10-12 DIAGNOSIS — S42102D Fracture of unspecified part of scapula, left shoulder, subsequent encounter for fracture with routine healing: Secondary | ICD-10-CM | POA: Diagnosis not present

## 2020-10-12 DIAGNOSIS — L97423 Non-pressure chronic ulcer of left heel and midfoot with necrosis of muscle: Secondary | ICD-10-CM | POA: Diagnosis not present

## 2020-10-12 DIAGNOSIS — G8929 Other chronic pain: Secondary | ICD-10-CM | POA: Diagnosis not present

## 2020-10-12 DIAGNOSIS — M1990 Unspecified osteoarthritis, unspecified site: Secondary | ICD-10-CM | POA: Diagnosis not present

## 2020-10-12 DIAGNOSIS — N138 Other obstructive and reflux uropathy: Secondary | ICD-10-CM | POA: Diagnosis not present

## 2020-10-12 DIAGNOSIS — L97511 Non-pressure chronic ulcer of other part of right foot limited to breakdown of skin: Secondary | ICD-10-CM | POA: Diagnosis not present

## 2020-10-12 DIAGNOSIS — F419 Anxiety disorder, unspecified: Secondary | ICD-10-CM | POA: Diagnosis not present

## 2020-10-12 DIAGNOSIS — E1142 Type 2 diabetes mellitus with diabetic polyneuropathy: Secondary | ICD-10-CM | POA: Diagnosis not present

## 2020-10-12 DIAGNOSIS — E1151 Type 2 diabetes mellitus with diabetic peripheral angiopathy without gangrene: Secondary | ICD-10-CM | POA: Diagnosis not present

## 2020-10-12 DIAGNOSIS — I480 Paroxysmal atrial fibrillation: Secondary | ICD-10-CM | POA: Diagnosis not present

## 2020-10-12 DIAGNOSIS — L409 Psoriasis, unspecified: Secondary | ICD-10-CM | POA: Diagnosis not present

## 2020-10-12 DIAGNOSIS — F3341 Major depressive disorder, recurrent, in partial remission: Secondary | ICD-10-CM | POA: Diagnosis not present

## 2020-10-12 DIAGNOSIS — Z7901 Long term (current) use of anticoagulants: Secondary | ICD-10-CM | POA: Diagnosis not present

## 2020-10-12 DIAGNOSIS — D649 Anemia, unspecified: Secondary | ICD-10-CM | POA: Diagnosis not present

## 2020-10-12 DIAGNOSIS — I2510 Atherosclerotic heart disease of native coronary artery without angina pectoris: Secondary | ICD-10-CM | POA: Diagnosis not present

## 2020-10-12 DIAGNOSIS — M47812 Spondylosis without myelopathy or radiculopathy, cervical region: Secondary | ICD-10-CM | POA: Diagnosis not present

## 2020-10-12 DIAGNOSIS — N401 Enlarged prostate with lower urinary tract symptoms: Secondary | ICD-10-CM | POA: Diagnosis not present

## 2020-10-12 DIAGNOSIS — I10 Essential (primary) hypertension: Secondary | ICD-10-CM | POA: Diagnosis not present

## 2020-10-12 DIAGNOSIS — Z794 Long term (current) use of insulin: Secondary | ICD-10-CM | POA: Diagnosis not present

## 2020-10-12 DIAGNOSIS — E785 Hyperlipidemia, unspecified: Secondary | ICD-10-CM | POA: Diagnosis not present

## 2020-10-12 DIAGNOSIS — Z7982 Long term (current) use of aspirin: Secondary | ICD-10-CM | POA: Diagnosis not present

## 2020-10-12 DIAGNOSIS — E11621 Type 2 diabetes mellitus with foot ulcer: Secondary | ICD-10-CM | POA: Diagnosis not present

## 2020-10-12 DIAGNOSIS — Z951 Presence of aortocoronary bypass graft: Secondary | ICD-10-CM | POA: Diagnosis not present

## 2020-10-13 ENCOUNTER — Other Ambulatory Visit: Payer: Self-pay

## 2020-10-13 ENCOUNTER — Encounter (HOSPITAL_BASED_OUTPATIENT_CLINIC_OR_DEPARTMENT_OTHER): Payer: HMO | Admitting: Internal Medicine

## 2020-10-13 DIAGNOSIS — L89624 Pressure ulcer of left heel, stage 4: Secondary | ICD-10-CM | POA: Diagnosis not present

## 2020-10-13 DIAGNOSIS — L97512 Non-pressure chronic ulcer of other part of right foot with fat layer exposed: Secondary | ICD-10-CM | POA: Diagnosis not present

## 2020-10-13 DIAGNOSIS — E11621 Type 2 diabetes mellitus with foot ulcer: Secondary | ICD-10-CM | POA: Diagnosis not present

## 2020-10-13 NOTE — Progress Notes (Signed)
[name] [name] ([id number]) Visit Report for 10/13/2020 Debridement Details Patient Name: Date of Service: [name] [name] [facility] THA N L. 10/13/2020 9:15 A M Medical Record Number: [id number] Patient Account Number: [id number] Date of Birth/Sex: Treating RN: [birth year] (65 y.o. [name] [name] Primary Care Provider: Scarlette [name] Other Clinician: Referring Provider: Treating Provider/Extender: [name] [name] in Treatment: 14 Debridement Performed for Assessment: Wound #1 Left Calcaneus Performed By: Physician [name] [name]., MD Debridement Type: Debridement Level of Consciousness (Pre-procedure): Awake and Alert Pre-procedure Verification/Time Out Yes - 10:25 Taken: Start Time: 10:26 Pain Control: Other : benzocaine 20% T Area Debrided (L x W): otal 2.7 (cm) x 2.3 (cm) = 6.21 (cm) Tissue and other material debrided: Viable, Non-Viable, Slough, Subcutaneous, Skin: Dermis , Skin: Epidermis, Fibrin/Exudate, Slough Level: Skin/Subcutaneous Tissue Debridement Description: Excisional Instrument: Curette Bleeding: Minimum Hemostasis Achieved: Pressure End Time: 10:31 Procedural Pain: 4 Post Procedural Pain: 4 Response to Treatment: Procedure was tolerated well Level of Consciousness (Post- Awake and Alert procedure): Post Debridement Measurements of Total Wound Length: (cm) 2.7 Stage: Category/Stage IV Width: (cm) 2.3 Depth: (cm) 1 Volume: (cm) 4.877 Character of Wound/Ulcer Post Debridement: Requires Further Debridement Post Procedure Diagnosis Same as Pre-procedure Electronic Signature(s) Signed: 10/13/2020 4:49:53 PM By: [name] Ham MD Signed: 10/13/2020 5:46:14 PM By: [name] Pilling RN, BSN Entered By: [name] [name] on 10/13/2020 10:54:04 -------------------------------------------------------------------------------- Debridement Details Patient Name: Date of Service: [name] [name] [name] THA N L. 10/13/2020 9:15 A M Medical Record Number:  [id number] Patient Account Number: [id number] Date of Birth/Sex: Treating RN: [birth year] (65 y.o. [name] [name], [name] [name] Primary Care Provider: Scarlette [name] Other Clinician: Referring Provider: Treating Provider/Extender: [name] [name] in Treatment: 14 Debridement Performed for Assessment: Wound #3 Right,Lateral Foot Performed By: Physician [name] [name]., MD Debridement Type: Debridement Severity of Tissue Pre Debridement: Fat layer exposed Level of Consciousness (Pre-procedure): Awake and Alert Pre-procedure Verification/Time Out Yes - 10:25 Taken: Start Time: 10:26 Pain Control: Other : benzocaine 20% T Area Debrided (L x W): otal 1.1 (cm) x 1.2 (cm) = 1.32 (cm) Tissue and other material debrided: Viable, Non-Viable, Callus, Slough, Subcutaneous, Skin: Dermis , Skin: Epidermis, Fibrin/Exudate, Slough Level: Skin/Subcutaneous Tissue Debridement Description: Excisional Instrument: Curette Bleeding: Minimum Hemostasis Achieved: Pressure End Time: 10:31 Procedural Pain: 4 Post Procedural Pain: 4 Response to Treatment: Procedure was tolerated well Level of Consciousness (Post- Awake and Alert procedure): Post Debridement Measurements of Total Wound Length: (cm) 1.1 Width: (cm) 1.2 Depth: (cm) 0.4 Volume: (cm) 0.415 Character of Wound/Ulcer Post Debridement: Requires Further Debridement Severity of Tissue Post Debridement: Fat layer exposed Post Procedure Diagnosis Same as Pre-procedure Electronic Signature(s) Signed: 10/13/2020 4:49:53 PM By: [name] Ham MD Signed: 10/13/2020 5:46:14 PM By: [name] Pilling RN, BSN Entered By: [name] [name] on 10/13/2020 13:34:49 -------------------------------------------------------------------------------- HPI Details Patient Name: Date of Service: [name] [name] [name] THA N L. 10/13/2020 9:15 A M Medical Record Number: [id number] Patient Account Number: [id number] Date of Birth/Sex: Treating RN: [birth year] (65 y.o.  [name] [name] Primary Care Provider: Scarlette [name] Other Clinician: Referring Provider: Treating Provider/Extender: [name] [name] in Treatment: 98 History of Present Illness HPI Description: ADMISSION 07/07/2020 This is a 65 year old man who is a type II diabetic. I have reviewed this in epic. It appeared that his diabetes was diagnosed in 2009 at which time he was on metformin. He was followed for a period of time by an endocrinologist with [facility] to always labeled him as a type II diabetic. Recently  in epic there is been a switch to type 1 diabetes. If there were defining antibody test done I do not see them and I think the totality of the evidence would suggest that he is a type II diabetic. His recent problem started in May when he fell down 17 stairs. He suffered a scapular fracture he was hospitalized at [facility] for 9 days and then discharged to [facility]. His mother who is present said that most of these wounds seem to develop surrounding the discharge from hospital or the stay at [facility]. He has an area on his lower sacrum which appears currently to be a stage II but per their description this is actually improved quite a bit. He has an area on the left lateral foot at roughly the base of the fifth metatarsal, the left Achilles heel and the right fifth metatarsal head laterally they have been using topical antibiotics. Seen by primary care yesterday and placed on Keflex The patient had arterial studies on 05/05/2020. I wondered whether this had something to do with wounds on his feet at that time but I have not been able to determine this. At that time his ABI on the right was 1.09 with a TBI of 0.65 on the left 0.88 with a TBI of 0.44. Waveforms were triphasic on the right monophasic on the left. He likely has significant PAD on the left Past medical history includes type 2 diabetes poorly controlled last hemoglobin A1c at 12.7 in May, hypertension,  hyperlipidemia, atrial fib on Eliquis, coronary artery disease status post CABG, left scapular fracture in May of this year, AAA repair. Bilateral leg weakness ABIs were not done in our clinic as they were just done in May of this year at [facility] 7/14; patient arrives back in clinic he has areas on the left Achilles heel, base of the left fifth metatarsal, right lateral fifth metatarsal. The area he had over the coccyx appears to be healed but this area remains vulnerable. We have been using Iodoflex. I rereviewed his ABIs which were done in May. I do not know exactly what prompted these test however they do show a TBI of 0.44 on the left and monophasic waveforms. 7/21; the patient's wounds are about the same. Certainly no healing and the nurses noted some green drainage on the right fifth metatarsal head, left Achilles a little larger. We have been using silver alginate really without any improvement. We did get an appointment with vascular surgery however its that it did the end of August and they seem to want to repeat the noninvasive test that they have already done in May. I am not sure what the issue here is. We advised the patient's sister to call and point to the idea that they have already had noninvasive studies. The real question I have is does this patient need an angiogram. He is a diabetic and it is possible that the ABIs are falsely elevated because of medial calcification. At least on the left his TBI was abnormal 08/08/2020 upon evaluation today patient appears to be doing about the same in regard to his wounds. He still waiting the appointment with vascular. This is something that hopefully should be undertaken shortly. The 29th of this month is when he is actually scheduled although they have put in a call to move this up if at all possible. Fortunately there does not appear to be any signs of active infection at this time. No fevers, chills, nausea, vomiting, or  diarrhea. 8/18; appointment with Dr. Trula Slade on 8/29. The patient has wounds on the left Achilles heel, left lateral foot and the right lateral fifth metatarsal head. We have been using Santyl under Hydrofera Blue. The surface of especially the left heel looks better to me than what I remember. 9/1; since the patient was last here he underwent angiography by Dr. Trula Slade he also underwent noninvasive studies first showing a ABI of of 0.64 with monophasic waveforms. His TBI was 0.35 with a left great toe pressure of 46 this is about the same pattern as we saw previously. The left was a lot worse than the right. He underwent an angiogram on 08/29/2020. He had multiple 80% peroneal artery stenosis which was his dominant vessel which was treated with angioplasty with no residual stenosis. 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 The anterior tibial artery was also occluded. An attempt was made at recanalization of the anterior tibial artery however this was unsuccessful. Noted that the peroneal artery reconstitutes the posterior tibial artery and the anterior tibial artery creating the plantar arch. The patient has areas on the Achilles left heel left lateral foot and right lateral fifth metatarsal head we have been using Santyl and Hydrofera Blue 9/22; since the patient was last here he went for repeat vascular studies. On the right his ABI was 0.94 with biphasic waveforms TBI of 0.73 with a great toe pressure of 75. On the left which is the problematic wound his ABI was 1.08 but with biphasic waveforms at the PTA. Biphasic waveforms at the dorsalis pedis his TBI was 0.63 with a great toe pressure of 65. On the left however this does represent a fairly marked improvement. We have been using Santyl and Hydrofera Blue 9/29 left Achilles area. Still is having necrotic surface. Bleeds very easily as the patient is on a combination of Eliquis aspirin and Plavix just stopped yesterday. I am not going to  do any debridement until next week. We are using Santyl and Hydrofera Blue. His arterial status on the right was actually quite good. The area on the fifth metatarsal head needs additional debridement. A total contact cast in this area is not out of the question. He does describe claudication I believe with minimal activity. He is not very active X-ray we ordered last week is still pending in terms of the report 10/6 left Achilles area. In terms of the necrotic surface this is quite a bit better. There is still some debris in the posterior part of the wound however this looks better. Anteriorly the wound appears to be epithelialized there is still a divot in the middle of this but this does not probe to deep tissue. The area on the right fifth metatarsal head laterally really is not making as much progress as I would have liked. Still not completely a viable surface. Both the patient and his wife agree that he is not an external rotator in terms of ambulation [an outie]. We have been using Santyl and Hydrofera Blue for a prolonged period. On the left Achilles which looked like it is worse wound this seems to be making some progress not so much on the right foot. The patient and his wife state that they are rigorous about offloading both of these areas He was treated with angioplasty as I believe on the right peroneal artery that had multiple stenosis. This is a dominant vessel the anterior tibial and posterior tibial arteries are occluded however the foot is fed via collaterals from  the peroneal. From my notes I am not able to understand the vascular supply on the left I will have to review this. 10/13; left Achilles top part of this looks healthy granulation in the bottom part of this wound again completely necrotic requiring debridement. The area on the right lateral fifth met head also requires debridement. The majority of this is epithelialized however he she there is a refractory part to  this. Electronic Signature(s) Signed: 10/13/2020 4:49:53 PM By: [name] Ham MD Entered By: [name] [name] on 10/13/2020 10:54:52 -------------------------------------------------------------------------------- Physical Exam Details Patient Name: Date of Service: [name] [name] [name] THA N L. 10/13/2020 9:15 A M Medical Record Number: [id number] Patient Account Number: [id number] Date of Birth/Sex: Treating RN: 12-09-1955 (65 y.o. [name] [name] Primary Care Provider: Scarlette [name] Other Clinician: Referring Provider: Treating Provider/Extender: [name] [name] in Treatment: 14 Constitutional Sitting or standing Blood Pressure is within target range for patient.. Pulse regular and within target range for patient.Marland Kitchen Respirations regular, non-labored and within target range.. Temperature is normal and within the target range for the patient.Marland Kitchen Appears in no distress. Notes Wound exam; the area on the left Achilles required debridement with a #5 curette. Necrotic tissue in the distal 50%. The proximal 50% of this did not look so bad. There is no surrounding infection the wound does not go to bone. I did a swab culture after debridement and the bottom part of this wound The right lateral fifth metatarsal head also required debridement. I am not able to get to a healthy looking surface in spite of the epithelialization superiorly Electronic Signature(s) Signed: 10/13/2020 4:49:53 PM By: [name] Ham MD Entered By: [name] [name] on 10/13/2020 10:57:07 -------------------------------------------------------------------------------- Physician Orders Details Patient Name: Date of Service: [name] [name] [name] THA N L. 10/13/2020 9:15 A M Medical Record Number: [id number] Patient Account Number: [id number] Date of Birth/Sex: Treating RN: [birth year] (65 y.o. [name] [name], [name] [name] Primary Care Provider: Scarlette [name] Other Clinician: Referring Provider: Treating Provider/Extender:  [name] [name] in Treatment: 66 Verbal / Phone Orders: No Diagnosis Coding ICD-10 Coding Code Description E11.621 Type 2 diabetes mellitus with foot ulcer E11.51 Type 2 diabetes mellitus with diabetic peripheral angiopathy without gangrene L97.528 Non-pressure chronic ulcer of other part of left foot with other specified severity L97.511 Non-pressure chronic ulcer of other part of right foot limited to breakdown of skin E11.42 Type 2 diabetes mellitus with diabetic polyneuropathy Follow-up Appointments ppointment in 1 week. - Dr. Dellia Nims Return A Bathing/ Shower/ Hygiene May shower and wash wound with soap and water. - with dressing changes only. Edema Control - Lymphedema / SCD / Other Elevate legs to the level of the heart or above for 30 minutes daily and/or when sitting, a frequency of: - 3-4 times a day throughout the day. Off-Loading Turn and reposition every 2 hours Other: - float heels while resting in bed or chair. Use a pillow or rolled sheet under legs to offload the heels. Home Health New wound care orders this week; continue Home Health for wound care. May utilize formulary equivalent dressing for wound treatment orders unless otherwise specified. - left heel wound change to calcium alginate Ag every other day dressing changes. continue same dressings daily changes to right foot santyl and hydrofera blue. home health to change three times a week. all other days family to change. Other Home Health Orders/Instructions: - [facility] home health three times a week and family to change all other days. Wound Treatment Wound #1 -  Calcaneus Wound Laterality: Left Cleanser: Soap and Water [facility]) Every Other Day/30 Days Discharge Instructions: May shower and wash wound with dial antibacterial soap and water prior to dressing change. Cleanser: Wound Cleanser [facility]) Every Other Day/30 Days Discharge Instructions: Cleanse the wound with wound cleanser  prior to applying a clean dressing using gauze sponges, not tissue or cotton balls. Prim Dressing: KerraCel Ag Gelling Fiber Dressing, 4x5 in (silver alginate) (Home Health) Every Other Day/30 Days ary Discharge Instructions: Apply silver alginate lightly pack into depth of wound. Secondary Dressing: Woven Gauze Sponge, Non-Sterile 4x4 in [facility]) Every Other Day/30 Days Discharge Instructions: Apply over primary dressing as directed. Secondary Dressing: ALLEVYN Heel 4 1/2in x 5 1/2in / 10.5cm x 13.5cm [facility]) Every Other Day/30 Days Discharge Instructions: Apply over primary dressing as directed. Secured With: [employer], 4.5x3.1 (in/yd) [facility]) Every Other Day/30 Days Discharge Instructions: Secure with Kerlix as directed. Secured With: 44M Medipore H Soft Cloth Surgical T 4 x 2 (in/yd) (Home Health) Every Other Day/30 Days ape Discharge Instructions: Secure dressing with tape as directed. Wound #3 - Foot Wound Laterality: Right, Lateral Cleanser: Soap and Water (Home Health) 1 x Per Day/30 Days Discharge Instructions: May shower and wash wound with dial antibacterial soap and water prior to dressing change. Cleanser: Wound Cleanser (Home Health) 1 x Per Day/30 Days Discharge Instructions: Cleanse the wound with wound cleanser prior to applying a clean dressing using gauze sponges, not tissue or cotton balls. Prim Dressing: Hydrofera Blue Classic Foam, 4x4 in (Home Health) 1 x Per Day/30 Days ary Discharge Instructions: Moisten with saline prior to applying to wound bed Prim Dressing: Santyl Ointment ([place]) 1 x Per Day/30 Days ary Discharge Instructions: Apply nickel thick amount to wound bed as instructed Secondary Dressing: Woven Gauze Sponge, Non-Sterile 4x4 in (Home Health) 1 x Per Day/30 Days Discharge Instructions: Apply over primary dressing as directed. Secured With: [employer], 4.5x3.1 (in/yd) (Home Health) 1 x Per Day/30 Days Discharge  Instructions: Secure with Kerlix as directed. Secured With: 44M Medipore H Soft Cloth Surgical T 4 x 2 (in/yd) (Home Health) 1 x Per Day/30 Days ape Discharge Instructions: Secure dressing with tape as directed. Laboratory erobe culture (MICRO) - culture of left heel. Someone will call you with results when the results Bacteria identified in Unspecified specimen by A return to clinic. - (ICD10 L97.528 - Non-pressure chronic ulcer of other part of left foot with other specified severity) LOINC Code: 272-5 Convenience Name: Areobic culture-specimen not specified Electronic Signature(s) Signed: 10/13/2020 4:49:53 PM By: [name] Ham MD Signed: 10/13/2020 5:46:14 PM By: [name] Pilling RN, BSN Entered By: [name] [name] on 10/13/2020 10:39:50 -------------------------------------------------------------------------------- Problem List Details Patient Name: Date of Service: [name] [name] [name] THA N L. 10/13/2020 9:15 A M Medical Record Number: [id number] Patient Account Number: [id number] Date of Birth/Sex: Treating RN: April 26, 1955 (65 y.o. [name] [name], [name] Primary Care Provider: Scarlette [name] Other Clinician: Referring Provider: Treating Provider/Extender: [name] [name] in Treatment: 14 Active Problems ICD-10 Encounter Code Description Active Date MDM Diagnosis E11.621 Type 2 diabetes mellitus with foot ulcer 07/07/2020 No Yes E11.51 Type 2 diabetes mellitus with diabetic peripheral angiopathy without gangrene 09/01/2020 No Yes L97.528 Non-pressure chronic ulcer of other part of left foot with other specified 07/07/2020 No Yes severity L97.511 Non-pressure chronic ulcer of other part of right foot limited to breakdown of 07/07/2020 No Yes skin E11.42 Type 2 diabetes mellitus with diabetic polyneuropathy 07/07/2020 No Yes  Inactive Problems ICD-10 Code Description Active Date Inactive Date L89.152 Pressure ulcer of sacral region, stage 2 07/07/2020 07/07/2020 I62.703 Pressure  ulcer of left heel, stage 2 07/07/2020 07/07/2020 Resolved Problems Electronic Signature(s) Signed: 10/13/2020 4:49:53 PM By: [name] Ham MD Entered By: [name] [name] on 10/13/2020 10:53:11 -------------------------------------------------------------------------------- Progress Note Details Patient Name: Date of Service: [name] [name] [name] THA N L. 10/13/2020 9:15 A M Medical Record Number: [id number] Patient Account Number: [id number] Date of Birth/Sex: Treating RN: 07-19-1955 (65 y.o. [name] [name] Primary Care Provider: Scarlette [name] Other Clinician: Referring Provider: Treating Provider/Extender: [name] [name] in Treatment: 14 Subjective History of Present Illness (HPI) ADMISSION 07/07/2020 This is a 65 year old man who is a type II diabetic. I have reviewed this in epic. It appeared that his diabetes was diagnosed in 2009 at which time he was on metformin. He was followed for a period of time by an endocrinologist with [facility] to always labeled him as a type II diabetic. Recently in epic there is been a switch to type 1 diabetes. If there were defining antibody test done I do not see them and I think the totality of the evidence would suggest that he is a type II diabetic. His recent problem started in May when he fell down 17 stairs. He suffered a scapular fracture he was hospitalized at [facility] for 9 days and then discharged to [facility]. His mother who is present said that most of these wounds seem to develop surrounding the discharge from hospital or the stay at [facility]. He has an area on his lower sacrum which appears currently to be a stage II but per their description this is actually improved quite a bit. He has an area on the left lateral foot at roughly the base of the fifth metatarsal, the left Achilles heel and the right fifth metatarsal head laterally they have been using topical antibiotics. Seen by primary care yesterday and placed on  Keflex The patient had arterial studies on 05/05/2020. I wondered whether this had something to do with wounds on his feet at that time but I have not been able to determine this. At that time his ABI on the right was 1.09 with a TBI of 0.65 on the left 0.88 with a TBI of 0.44. Waveforms were triphasic on the right monophasic on the left. He likely has significant PAD on the left Past medical history includes type 2 diabetes poorly controlled last hemoglobin A1c at 12.7 in May, hypertension, hyperlipidemia, atrial fib on Eliquis, coronary artery disease status post CABG, left scapular fracture in May of this year, AAA repair. Bilateral leg weakness ABIs were not done in our clinic as they were just done in May of this year at vein [facility] vascular 7/14; patient arrives back in clinic he has areas on the left Achilles heel, base of the left fifth metatarsal, right lateral fifth metatarsal. The area he had over the coccyx appears to be healed but this area remains vulnerable. We have been using Iodoflex. I rereviewed his ABIs which were done in May. I do not know exactly what prompted these test however they do show a TBI of 0.44 on the left and monophasic waveforms. 7/21; the patient's wounds are about the same. Certainly no healing and the nurses noted some green drainage on the right fifth metatarsal head, left Achilles a little larger. We have been using silver alginate really without any improvement. We did get an appointment with vascular surgery however its  that it did the end of August and they seem to want to repeat the noninvasive test that they have already done in May. I am not sure what the issue here is. We advised the patient's sister to call and point to the idea that they have already had noninvasive studies. The real question I have is does this patient need an angiogram. He is a diabetic and it is possible that the ABIs are falsely elevated because of medial calcification. At least on the  left his TBI was abnormal 08/08/2020 upon evaluation today patient appears to be doing about the same in regard to his wounds. He still waiting the appointment with vascular. This is something that hopefully should be undertaken shortly. The 29th of this month is when he is actually scheduled although they have put in a call to move this up if at all possible. Fortunately there does not appear to be any signs of active infection at this time. No fevers, chills, nausea, vomiting, or diarrhea. 8/18; appointment with Dr. Trula Slade on 8/29. The patient has wounds on the left Achilles heel, left lateral foot and the right lateral fifth metatarsal head. We have been using Santyl under Hydrofera Blue. The surface of especially the left heel looks better to me than what I remember. 9/1; since the patient was last here he underwent angiography by Dr. Trula Slade he also underwent noninvasive studies first showing a ABI of of 0.64 with monophasic waveforms. His TBI was 0.35 with a left great toe pressure of 46 this is about the same pattern as we saw previously. The left was a lot worse than the right. He underwent an angiogram on 08/29/2020. He had multiple 80% peroneal artery stenosis which was his dominant vessel which was treated with angioplasty with no residual stenosis. 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 The anterior tibial artery was also occluded. An attempt was made at recanalization of the anterior tibial artery however this was unsuccessful. Noted that the peroneal artery reconstitutes the posterior tibial artery and the anterior tibial artery creating the plantar arch. The patient has areas on the Achilles left heel left lateral foot and right lateral fifth metatarsal head we have been using Santyl and Hydrofera Blue 9/22; since the patient was last here he went for repeat vascular studies. On the right his ABI was 0.94 with biphasic waveforms TBI of 0.73 with a great toe pressure of 75. On the  left which is the problematic wound his ABI was 1.08 but with biphasic waveforms at the PTA. Biphasic waveforms at the dorsalis pedis his TBI was 0.63 with a great toe pressure of 65. On the left however this does represent a fairly marked improvement. We have been using Santyl and Hydrofera Blue 9/29 left Achilles area. Still is having necrotic surface. Bleeds very easily as the patient is on a combination of Eliquis aspirin and Plavix just stopped yesterday. I am not going to do any debridement until next week. We are using Santyl and Hydrofera Blue. His arterial status on the right was actually quite good. The area on the fifth metatarsal head needs additional debridement. A total contact cast in this area is not out of the question. He does describe claudication I believe with minimal activity. He is not very active X-ray we ordered last week is still pending in terms of the report 10/6 left Achilles area. In terms of the necrotic surface this is quite a bit better. There is still some debris in the posterior  part of the wound however this looks better. Anteriorly the wound appears to be epithelialized there is still a divot in the middle of this but this does not probe to deep tissue. The area on the right fifth metatarsal head laterally really is not making as much progress as I would have liked. Still not completely a viable surface. Both the patient and his wife agree that he is not an external rotator in terms of ambulation [an outie]. We have been using Santyl and Hydrofera Blue for a prolonged period. On the left Achilles which looked like it is worse wound this seems to be making some progress not so much on the right foot. The patient and his wife state that they are rigorous about offloading both of these areas He was treated with angioplasty as I believe on the right peroneal artery that had multiple stenosis. This is a dominant vessel the anterior tibial and posterior tibial arteries  are occluded however the foot is fed via collaterals from the peroneal. From my notes I am not able to understand the vascular supply on the left I will have to review this. 10/13; left Achilles top part of this looks healthy granulation in the bottom part of this wound again completely necrotic requiring debridement. oo The area on the right lateral fifth met head also requires debridement. The majority of this is epithelialized however he she there is a refractory part to this. Objective Constitutional Sitting or standing Blood Pressure is within target range for patient.. Pulse regular and within target range for patient.Marland Kitchen Respirations regular, non-labored and within target range.. Temperature is normal and within the target range for the patient.Marland Kitchen Appears in no distress. Vitals Time Taken: 9:21 AM, Height: 74 in, Weight: 151 lbs, BMI: 19.4, Temperature: 98.5 F, Pulse: 74 bpm, Respiratory Rate: 16 breaths/min, Blood Pressure: 103/65 mmHg. General Notes: Wound exam; the area on the left Achilles required debridement with a #5 curette. Necrotic tissue in the distal 50%. The proximal 50% of this did not look so bad. There is no surrounding infection the wound does not go to bone. I did a swab culture after debridement and the bottom part of this wound oo The right lateral fifth metatarsal head also required debridement. I am not able to get to a healthy looking surface in spite of the epithelialization superiorly Integumentary (Hair, Skin) Wound #1 status is Open. Original cause of wound was Pressure Injury. The date acquired was: 05/23/2020. The wound has been in treatment 14 weeks. The wound is located on the Left Calcaneus. The wound measures 2.7cm length x 2.3cm width x 1cm depth; 4.877cm^2 area and 4.877cm^3 volume. There is Fat Layer (Subcutaneous Tissue) exposed. There is no tunneling noted, however, there is undermining starting at 3:00 and ending at 9:00 with a maximum distance of 0.5cm.  There is a medium amount of serosanguineous drainage noted. The wound margin is distinct with the outline attached to the wound base. There is large (67-100%) pink granulation within the wound bed. There is a small (1-33%) amount of necrotic tissue within the wound bed including Adherent Slough. Wound #3 status is Open. Original cause of wound was Gradually Appeared. The date acquired was: 06/21/2020. The wound has been in treatment 14 weeks. The wound is located on the Right,Lateral Foot. The wound measures 1.1cm length x 1.2cm width x 0.4cm depth; 1.037cm^2 area and 0.415cm^3 volume. There is Fat Layer (Subcutaneous Tissue) exposed. There is no tunneling noted, however, there is undermining starting at 9:00  and ending at 12:00 with a maximum distance of 0.3cm. There is a medium amount of serosanguineous drainage noted. The wound margin is well defined and not attached to the wound base. There is small (1-33%) pink, pale granulation within the wound bed. There is a large (67-100%) amount of necrotic tissue within the wound bed including Adherent Slough. General Notes: maceration and callous present to periwound. 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 There was a Excisional Skin/Subcutaneous Tissue Debridement with a total area of 6.21 sq cm performed by [name] [name]., MD. With the following instrument(s): Curette to remove Viable and Non-Viable tissue/material. Material removed includes Subcutaneous Tissue, Slough, Skin: Dermis, Skin: Epidermis, and  Fibrin/Exudate after achieving pain control using Other (benzocaine 20%). A time out was conducted at 10:25, prior to the start of the procedure. A Minimum amount of bleeding was controlled with Pressure. The procedure was tolerated well with a pain level of 4 throughout and a pain level of 4 following the procedure. Post Debridement Measurements: 2.7cm length x 2.3cm width x 1cm depth; 4.877cm^3 volume. Post debridement Stage noted as Category/Stage IV. Character of Wound/Ulcer Post Debridement requires further debridement. Post procedure Diagnosis Wound #1: Same as Pre-Procedure Wound #3 Pre-procedure diagnosis of Wound #3 is a Diabetic Wound/Ulcer of the Lower Extremity located on the Right,Lateral Foot .Severity of Tissue Pre Debridement is: Fat layer exposed. There was a Excisional Skin/Subcutaneous Tissue Debridement with a total area of 1.32 sq cm performed by [name] [name]., MD. With the following instrument(s): Curette to remove Viable and Non-Viable tissue/material. Material removed includes Callus, Subcutaneous Tissue, Slough, Skin: Dermis, Skin: Epidermis, and Fibrin/Exudate after achieving pain control using Other (benzocaine 20%). A time out was conducted at 10:25, prior to the start of the procedure. A Minimum amount of bleeding was controlled with Pressure. The procedure was tolerated well with a pain level of 4 throughout and a pain level of 4 following the procedure. Post Debridement Measurements: 1.1cm length x 1.2cm width x 0.4cm depth; 0.415cm^3 volume. Character of Wound/Ulcer Post Debridement requires further debridement. Severity of Tissue Post Debridement is: Fat layer exposed. Post procedure Diagnosis Wound #3: Same as Pre-Procedure Plan Follow-up Appointments: Return Appointment in 1 week. - Dr. Dellia Nims Bathing/ Shower/ Hygiene: May shower and wash wound with soap and water. - with dressing changes only. Edema Control - Lymphedema / SCD / Other: Elevate legs to the  level of the heart or above for 30 minutes daily and/or when sitting, a frequency of: - 3-4 times a day throughout the day. Off-Loading: Turn and reposition every 2 hours Other: - float heels while resting in bed or chair. Use a pillow or rolled sheet under legs to offload the heels. Home Health: New wound care orders this week; continue Home Health for wound care. May utilize formulary equivalent dressing for wound treatment orders unless otherwise specified. - left heel wound change to calcium alginate Ag every other day dressing changes. continue same dressings daily changes to right foot santyl and hydrofera blue. home health to change three times a week. all other days family to change. Other Home Health  Orders/Instructions: - [facility] home health three times a week and family to change all other days. Laboratory ordered were: Areobic culture-specimen not specified - culture of left heel. Someone will call you with results when the results return to clinic. WOUND #1: - Calcaneus Wound Laterality: Left Cleanser: Soap and Water [facility]) Every Other Day/30 Days Discharge Instructions: May shower and wash wound with dial antibacterial soap and water prior to dressing change. Cleanser: Wound Cleanser [facility]) Every Other Day/30 Days Discharge Instructions: Cleanse the wound with wound cleanser prior to applying a clean dressing using gauze sponges, not tissue or cotton balls. Prim Dressing: KerraCel Ag Gelling Fiber Dressing, 4x5 in (silver alginate) (Home Health) Every Other Day/30 Days ary Discharge Instructions: Apply silver alginate lightly pack into depth of wound. Secondary Dressing: Woven Gauze Sponge, Non-Sterile 4x4 in [facility]) Every Other Day/30 Days Discharge Instructions: Apply over primary dressing as directed. Secondary Dressing: ALLEVYN Heel 4 1/2in x 5 1/2in / 10.5cm x 13.5cm [facility]) Every Other Day/30 Days Discharge Instructions: Apply over primary dressing  as directed. Secured With: [employer], 4.5x3.1 (in/yd) [facility]) Every Other Day/30 Days Discharge Instructions: Secure with Kerlix as directed. Secured With: 82M Medipore H Soft Cloth Surgical T 4 x 2 (in/yd) (Home Health) Every Other Day/30 Days ape Discharge Instructions: Secure dressing with tape as directed. WOUND #3: - Foot Wound Laterality: Right, Lateral Cleanser: Soap and Water (Home Health) 1 x Per Day/30 Days Discharge Instructions: May shower and wash wound with dial antibacterial soap and water prior to dressing change. Cleanser: Wound Cleanser (Home Health) 1 x Per Day/30 Days Discharge Instructions: Cleanse the wound with wound cleanser prior to applying a clean dressing using gauze sponges, not tissue or cotton balls. Prim Dressing: Hydrofera Blue Classic Foam, 4x4 in (Home Health) 1 x Per Day/30 Days ary Discharge Instructions: Moisten with saline prior to applying to wound bed Prim Dressing: Santyl Ointment ([facility]) 1 x Per Day/30 Days ary Discharge Instructions: Apply nickel thick amount to wound bed as instructed Secondary Dressing: Woven Gauze Sponge, Non-Sterile 4x4 in (Home Health) 1 x Per Day/30 Days Discharge Instructions: Apply over primary dressing as directed. Secured With: [employer], 4.5x3.1 (in/yd) (Home Health) 1 x Per Day/30 Days Discharge Instructions: Secure with Kerlix as directed. Secured With: 82M Medipore H Soft Cloth Surgical T 4 x 2 (in/yd) (Home Health) 1 x Per Day/30 Days ape Discharge Instructions: Secure dressing with tape as directed. 1. I change the primary dressing on the left Achilles to silver alginate. A swab culture of this area but no empiric antibiotics 2. Still using Hydrofera Blue and Santyl on the right lateral fifth metatarsal head 3. The patient has a follow-up with vein and vascular Dr. Trula Slade on October 17 Electronic Signature(s) Signed: 10/13/2020 4:49:53 PM By: [name] Ham MD Signed: 10/13/2020  5:46:14 PM By: [name] Pilling RN, BSN Entered By: [name] [name] on 10/13/2020 13:35:04 -------------------------------------------------------------------------------- SuperBill Details Patient Name: Date of Service: [name] [name] [name] THA N L. 10/13/2020 Medical Record Number: [id number] Patient Account Number: [id number] Date of Birth/Sex: Treating RN: 01-19-1955 (65 y.o. [name] [name], [name] [name] Primary Care Provider: Scarlette [name] Other Clinician: Referring Provider: Treating Provider/Extender: [name] [name] in Treatment: 14 Diagnosis Coding ICD-10 Codes Code Description [phone] Type 2 diabetes mellitus with foot ulcer E11.51 Type 2 diabetes mellitus with diabetic peripheral angiopathy without gangrene L97.528 Non-pressure chronic ulcer of other part of left foot with other specified severity L97.511 Non-pressure chronic ulcer of  other part of right foot limited to breakdown of skin E11.42 Type 2 diabetes mellitus with diabetic polyneuropathy Facility Procedures CPT4 Code: [id number] Description: 48185 - DEB SUBQ TISSUE 20 SQ CM/< ICD-10 Diagnosis Description L97.528 Non-pressure chronic ulcer of other part of left foot with other specified sev L97.511 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

## 2020-10-13 NOTE — Progress Notes (Signed)
[name] [name] ([id number]) Visit Report for 10/13/2020 Arrival Information Details Patient Name: Date of Service: [name] [name] [facility] THA N L. 10/13/2020 9:15 A M Medical Record Number: [id number] Patient Account Number: [id number] Date of Birth/Sex: Treating RN: Jul 02, 1955 (65 y.o. [name] [name], [name] [name] Primary Care [name] [name]: [name] [name] Other Clinician: Referring [name]: Treating [name] [name]/Extender: [name] [name]: 9 Visit Information History Since Last Visit Added or deleted any medications: No Patient Arrived: Wheel Chair Any new allergies or adverse reactions: No Arrival Time: 09:20 Had a fall or experienced change in No Accompanied By: mother activities of daily living that may affect Transfer Assistance: None risk of falls: Patient Identification Verified: Yes Signs or symptoms of abuse/neglect since last visito No Secondary Verification Process Completed: Yes Hospitalized since last visit: No Patient Requires Transmission-Based Precautions: No Implantable device outside of the clinic excluding No Patient Has Alerts: Yes cellular tissue based products placed in the center Patient Alerts: Patient on Blood Thinner since last visit: Has Dressing in Place as Prescribed: Yes Has Footwear/Offloading in Place as Prescribed: Yes Left: Other:bunny boot Pain Present Now: Yes Electronic Signature(s) Signed: 10/13/2020 5:46:14 PM By: [name] Pilling RN, BSN Entered By: [name] [name] on 10/13/2020 09:27:00 -------------------------------------------------------------------------------- Encounter Discharge Information Details Patient Name: Date of Service: [name] [name] [facility] THA N L. 10/13/2020 9:15 A M Medical Record Number: [id number] Patient Account Number: [id number] Date of Birth/Sex: Treating RN: [birth year] (65 y.o. [name] [name] Primary Care [name]: [name] [name] Other Clinician: Referring [name] [name]: Treating [name] [name]/Extender: [name] [name]: 14 Encounter Discharge Information Items Post Procedure Vitals Discharge Condition: Stable Temperature (F): 98.5 Ambulatory Status: Wheelchair Pulse (bpm): 74 Discharge Destination: Home Respiratory Rate (breaths/min): 20 Transportation: Private Auto Blood Pressure (mmHg): 103/65 Accompanied By: mother Schedule Follow-up Appointment: Yes Clinical Summary of Care: Electronic Signature(s) Signed: 10/13/2020 5:46:14 PM By: [name] Pilling RN, BSN Entered By: [name] [name] on 10/13/2020 10:42:22 -------------------------------------------------------------------------------- Lower Extremity Assessment Details Patient Name: Date of Service: [name] [name] [facility] THA N L. 10/13/2020 9:15 A M Medical Record Number: [id number] Patient Account Number: [id number] Date of Birth/Sex: Treating RN: 01-15-1955 (65 y.o. [name] [name] Primary Care Verba Ainley: [name] [name] Other Clinician: Referring [name] [name]: Treating [name] [name]/Extender: [name] [name]: 14 Edema Assessment Assessed: [name] [name]: Yes] [name] [name]: Yes] Edema: [Left: Yes] [Right: No] Calf Left: Right: Point of Measurement: 34 cm From Medial Instep 32 cm 30 cm Ankle Left: Right: Point of Measurement: 10 cm From Medial Instep 23 cm 23.5 cm Vascular Assessment Pulses: Dorsalis Pedis Palpable: [Left:Yes] [Right:Yes] Electronic Signature(s) Signed: 10/13/2020 5:46:14 PM By: [name] Pilling RN, BSN Entered By: [name] [name] on 10/13/2020 09:31:20 -------------------------------------------------------------------------------- Multi Wound Chart Details Patient Name: Date of Service: [name] [name] [facility] THA N L. 10/13/2020 9:15 A M Medical Record Number: [id number] Patient Account Number: [id number] Date of Birth/Sex: Treating RN: 29-May-1955 (65 y.o. [name] [name] Primary Care Zhi Geier: [name] [name] Other Clinician: Referring [name] [name]: Treating [name] [name]/Extender:  [name] [name]: 14 Vital Signs Height(in): 9 Pulse(bpm): 59 Weight(lbs): 151 Blood Pressure(mmHg): 103/65 Body Mass Index(BMI): 19 Temperature(F): 98.5 Respiratory Rate(breaths/min): 16 Photos: [1:Left Calcaneus] [3:Right, Lateral Foot] [N/A:N/A N/A] Wound Location: [1:Pressure Injury] [3:Gradually Appeared] [N/A:N/A] Wounding Event: [1:Pressure Ulcer] [3:Diabetic Wound/Ulcer of the Lower] [N/A:N/A] Primary Etiology: [1:Coronary Artery Disease,] [3:Extremity Coronary Artery Disease,] [N/A:N/A] Comorbid History: [1:Hypertension, Type II Diabetes, Osteoarthritis, Neuropathy 05/23/2020] [3:Hypertension, Type II Diabetes, Osteoarthritis, Neuropathy 06/21/2020] [N/A:N/A] Date Acquired: [1:14] [3:14] [N/A:N/A] Weeks of [name]: [  1:Open] [3:Open] [N/A:N/A] Wound Status: [1:2.7x2.3x1] [3:1.1x1.2x0.4] [N/A:N/A] Measurements L x W x D (cm) [1:4.877] [3:1.037] [N/A:N/A] A (cm) : rea [1:4.877] [3:0.415] [N/A:N/A] Volume (cm) : [1:61.20%] [3:48.20%] [N/A:N/A] % Reduction in A [1:rea: -94.10%] [3:-3.50%] [N/A:N/A] % Reduction in Volume: [1:3] [3:9] Starting Position 1 (o'clock): [1:9] [3:12] Ending Position 1 (o'clock): [1:0.5] [3:0.3] Maximum Distance 1 (cm): [1:Yes] [3:Yes] [N/A:N/A] Undermining: [1:Category/Stage IV] [3:Grade 2] [N/A:N/A] Classification: [1:Medium] [3:Medium] [N/A:N/A] Exudate A mount: [1:Serosanguineous] [3:Serosanguineous] [N/A:N/A] Exudate Type: [1:red, brown] [3:red, brown] [N/A:N/A] Exudate Color: [1:Distinct, outline attached] [3:Well defined, not attached] [N/A:N/A] Wound Margin: [1:Large (67-100%)] [3:Small (1-33%)] [N/A:N/A] Granulation A mount: [1:Pink] [3:Pink, Pale] [N/A:N/A] Granulation Quality: [1:Small (1-33%)] [3:Large (67-100%)] [N/A:N/A] Necrotic A mount: [1:Fat Layer (Subcutaneous Tissue): Yes Fat Layer (Subcutaneous Tissue): Yes N/A] Exposed Structures: [1:Fascia: No Tendon: No Muscle: No Joint: No Bone: No Small  (1-33%)] [3:Fascia: No Tendon: No Muscle: No Joint: No Bone: No Medium (34-66%)] [N/A:N/A] Epithelialization: [1:Debridement - Excisional] [3:Debridement - Excisional] [N/A:N/A] Debridement: Pre-procedure Verification/Time Out 10:25 [3:10:25] [N/A:N/A] Taken: [1:Other] [3:Other] [N/A:N/A] Pain Control: [1:Subcutaneous, Slough] [3:Callus, Subcutaneous, Slough] [N/A:N/A] Tissue Debrided: [1:Skin/Subcutaneous Tissue] [3:Skin/Subcutaneous Tissue] [N/A:N/A] Level: [1:6.21] [3:1.32] [N/A:N/A] Debridement A (sq cm): [1:rea Curette] [3:Curette] [N/A:N/A] Instrument: [1:Minimum] [3:Minimum] [N/A:N/A] Bleeding: [1:Pressure] [3:Pressure] [N/A:N/A] Hemostasis A chieved: [1:4] [3:4] [N/A:N/A] Procedural Pain: [1:4] [3:4] [N/A:N/A] Post Procedural Pain: [1:Procedure was tolerated well] [3:Procedure was tolerated well] [N/A:N/A] Debridement [name] Response: [1:2.7x2.3x1] [3:1.1x1.2x0.4] [N/A:N/A] Post Debridement Measurements L x W x D (cm) [1:4.877] [3:0.415] [N/A:N/A] Post Debridement Volume: (cm) [1:Category/Stage IV] [3:N/A] [N/A:N/A] Post Debridement Stage: [1:N/A] [3:maceration and callous present to] [N/A:N/A] Assessment Notes: [1:Debridement] [3:periwound. N/A] [N/A:N/A] [name] Notes Wound #1 (Calcaneus) Wound Laterality: Left Cleanser Soap and Water Discharge Instruction: May shower and wash wound with dial antibacterial soap and water prior to dressing change. Wound Cleanser Discharge Instruction: Cleanse the wound with wound cleanser prior to applying a clean dressing using gauze sponges, not tissue or cotton balls. Peri-Wound Care Topical Primary Dressing KerraCel Ag Gelling Fiber Dressing, 4x5 in (silver alginate) Discharge Instruction: Apply silver alginate lightly pack into depth of wound. Secondary Dressing Woven Gauze Sponge, Non-Sterile 4x4 in Discharge Instruction: Apply over primary dressing as directed. ALLEVYN Heel 4 1/2in x 5 1/2in / 10.5cm x 13.5cm Discharge  Instruction: Apply over primary dressing as directed. Secured With [employer], 4.5x3.1 (in/yd) Discharge Instruction: Secure with Kerlix as directed. 70M Medipore H Soft Cloth Surgical T 4 x 2 (in/yd) ape Discharge Instruction: Secure dressing with tape as directed. Compression Wrap Compression Stockings Add-Ons Wound #3 (Foot) Wound Laterality: Right, Lateral Cleanser Soap and Water Discharge Instruction: May shower and wash wound with dial antibacterial soap and water prior to dressing change. Wound Cleanser Discharge Instruction: Cleanse the wound with wound cleanser prior to applying a clean dressing using gauze sponges, not tissue or cotton balls. Peri-Wound Care Topical Primary Dressing Hydrofera Blue Classic Foam, 4x4 in Discharge Instruction: Moisten with saline prior to applying to wound bed Santyl Ointment Discharge Instruction: Apply nickel thick amount to wound bed as instructed Secondary Dressing Woven Gauze Sponge, Non-Sterile 4x4 in Discharge Instruction: Apply over primary dressing as directed. Secured With [employer], 4.5x3.1 (in/yd) Discharge Instruction: Secure with Kerlix as directed. 70M Medipore H Soft Cloth Surgical T 4 x 2 (in/yd) ape Discharge Instruction: Secure dressing with tape as directed. Compression Wrap Compression Stockings Add-Ons Electronic Signature(s) Signed: 10/13/2020 4:49:53 PM By: Linton Ham MD Signed: 10/13/2020 5:46:14 PM By: [name] Pilling RN, BSN Entered By: Linton Ham on 10/13/2020 10:53:20 --------------------------------------------------------------------------------  Multi-Disciplinary Care Plan Details Patient Name: Date of Service: [name] [name] [facility] THA N L. 10/13/2020 9:15 A M Medical Record Number: [id number] Patient Account Number: [id number] Date of Birth/Sex: Treating RN: 1955-10-29 (65 y.o. [name] [name], [name] [name] Primary Care Masiah Woody: [name] [name] Other Clinician: Referring Kynleigh Artz: Treating  Carlas Vandyne/Extender: [name] [name]: 14 Active Inactive Necrotic Tissue Nursing Diagnoses: Impaired tissue integrity related to necrotic/devitalized tissue Knowledge deficit related to management of necrotic/devitalized tissue Goals: Necrotic/devitalized tissue will be minimized in the wound bed Date Initiated: 09/22/2020 Target Resolution Date: 11/04/2020 Goal Status: Active Patient/caregiver will verbalize understanding of reason and process for debridement of necrotic tissue Date Initiated: 09/22/2020 Date Inactivated: 10/06/2020 Target Resolution Date: 10/28/2020 Goal Status: Met Interventions: Provide education on necrotic tissue and debridement process [name] Activities: Enzymatic debridement : 09/22/2020 Excisional debridement : 09/22/2020 Notes: Pain, Acute or Chronic Nursing Diagnoses: Pain, acute or chronic: actual or potential Potential alteration in comfort, pain Goals: Patient will verbalize adequate pain control and receive pain control interventions during procedures as needed Date Initiated: 07/07/2020 Target Resolution Date: 10/27/2020 Goal Status: Active Patient/caregiver will verbalize comfort level met Date Initiated: 07/07/2020 Date Inactivated: 09/22/2020 Target Resolution Date: 09/30/2020 Goal Status: Met Interventions: Encourage patient to take pain medications as prescribed Provide education on pain management Reposition patient for comfort [name] Activities: Administer pain control measures as ordered : 07/07/2020 Notes: Wound/Skin Impairment Nursing Diagnoses: Knowledge deficit related to ulceration/compromised skin integrity Goals: Patient/caregiver will verbalize understanding of skin care regimen Date Initiated: 07/07/2020 Target Resolution Date: 10/21/2020 Goal Status: Active Interventions: Assess patient/caregiver ability to obtain necessary supplies Assess patient/caregiver ability to perform ulcer/skin care  regimen upon admission and as needed Provide education on ulcer and skin care [name] Activities: Skin care regimen initiated : 07/07/2020 Topical wound management initiated : 07/07/2020 Notes: Electronic Signature(s) Signed: 10/13/2020 5:46:14 PM By: [name] Pilling RN, BSN Entered By: [name] [name] on 10/13/2020 09:40:53 -------------------------------------------------------------------------------- Pain Assessment Details Patient Name: Date of Service: [name] [name] [facility] THA N L. 10/13/2020 9:15 A M Medical Record Number: [id number] Patient Account Number: [id number] Date of Birth/Sex: Treating RN: 08/13/1955 (65 y.o. [name] [name] Primary Care Tamaka Sawin: [name] [name] Other Clinician: Referring Jori Frerichs: Treating Kinberly Perris/Extender: [name] [name]: 14 Active Problems Location of Pain Severity and Description of Pain Patient Has Paino Yes Site Locations Pain Location: Generalized Pain, Pain in Ulcers Rate the pain. Current Pain Level: 7 Worst Pain Level: 10 Least Pain Level: 0 Tolerable Pain Level: 9 Character of Pain Describe the Pain: Burning, Dull, Heavy Pain Management and Medication Current Pain Management: Medication: Yes Cold Application: No Rest: Yes Massage: No Activity: No T.E.N.S.: No Heat Application: No Leg drop or elevation: No Is the Current Pain Management Adequate: Adequate How does your wound impact your activities of daily livingo Sleep: No Bathing: No Appetite: No Relationship With Others: No Bladder Continence: No Emotions: No Bowel Continence: No Work: No Toileting: No Drive: No Dressing: No Hobbies: No Engineer, maintenance) Signed: 10/13/2020 5:46:14 PM By: [name] Pilling RN, BSN Entered By: [name] [name] on 10/13/2020 09:30:27 -------------------------------------------------------------------------------- Patient/Caregiver Education Details Patient Name: Date of Service: [name] [name] [facility] THA N L.  10/13/2022andnbsp9:15 A M Medical Record Number: [id number] Patient Account Number: [id number] Date of Birth/Gender: Treating RN: August 11, 1955 (65 y.o. [name] [name] Primary Care Physician: [name] [name] Other Clinician: Referring Physician: Treating Physician/Extender: [name] [name]: 14 Education Assessment Education Provided To: Patient Education Topics Provided Wound/Skin Impairment: Handouts:  Skin Care Do's and Dont's Methods: Explain/Verbal Responses: Reinforcements needed Electronic Signature(s) Signed: 10/13/2020 5:46:14 PM By: [name] Pilling RN, BSN Entered By: [name] [name] on 10/13/2020 09:41:04 -------------------------------------------------------------------------------- Wound Assessment Details Patient Name: Date of Service: [name] [name] [facility] THA N L. 10/13/2020 9:15 A M Medical Record Number: [id number] Patient Account Number: [id number] Date of Birth/Sex: Treating RN: 10-Dec-1955 (65 y.o. [name] [name], [name] Primary Care Zuriyah Shatz: [name] [name] Other Clinician: Referring Leeona Mccardle: Treating Dehlia Kilner/Extender: [name] [name]: 14 Wound Status Wound Number: 1 Primary Pressure Ulcer Etiology: Wound Location: Left Calcaneus Wound Open Wounding Event: Pressure Injury Status: Date Acquired: 05/23/2020 Comorbid Coronary Artery Disease, Hypertension, Type II Diabetes, Weeks Of [name]: 14 History: Osteoarthritis, Neuropathy Clustered Wound: No Photos Wound Measurements Length: (cm) 2.7 Width: (cm) 2.3 Depth: (cm) 1 Area: (cm) 4.877 Volume: (cm) 4.877 % Reduction in Area: 61.2% % Reduction in Volume: -94.1% Epithelialization: Small (1-33%) Tunneling: No Undermining: Yes Starting Position (o'clock): 3 Ending Position (o'clock): 9 Maximum Distance: (cm) 0.5 Wound Description Classification: Category/Stage IV Wound Margin: Distinct, outline attached Exudate Amount: Medium Exudate Type:  Serosanguineous Exudate Color: red, brown Wound Bed Granulation Amount: Large (67-100%) Granulation Quality: Pink Necrotic Amount: Small (1-33%) Necrotic Quality: Adherent Slough Foul Odor After Cleansing: No Slough/Fibrino Yes Exposed Structure Fascia Exposed: No Fat Layer (Subcutaneous Tissue) Exposed: Yes Tendon Exposed: No Muscle Exposed: No Joint Exposed: No Bone Exposed: No [name] Notes Wound #1 (Calcaneus) Wound Laterality: Left Cleanser Soap and Water Discharge Instruction: May shower and wash wound with dial antibacterial soap and water prior to dressing change. Wound Cleanser Discharge Instruction: Cleanse the wound with wound cleanser prior to applying a clean dressing using gauze sponges, not tissue or cotton balls. Peri-Wound Care Topical Primary Dressing KerraCel Ag Gelling Fiber Dressing, 4x5 in (silver alginate) Discharge Instruction: Apply silver alginate lightly pack into depth of wound. Secondary Dressing Woven Gauze Sponge, Non-Sterile 4x4 in Discharge Instruction: Apply over primary dressing as directed. ALLEVYN Heel 4 1/2in x 5 1/2in / 10.5cm x 13.5cm Discharge Instruction: Apply over primary dressing as directed. Secured With [employer], 4.5x3.1 (in/yd) Discharge Instruction: Secure with Kerlix as directed. 18M Medipore H Soft Cloth Surgical T 4 x 2 (in/yd) ape Discharge Instruction: Secure dressing with tape as directed. Compression Wrap Compression Stockings Add-Ons Electronic Signature(s) Signed: 10/13/2020 5:46:14 PM By: [name] Pilling RN, BSN Entered By: [name] [name] on 10/13/2020 09:38:18 -------------------------------------------------------------------------------- Wound Assessment Details Patient Name: Date of Service: [name] [name] [facility] THA N L. 10/13/2020 9:15 A M Medical Record Number: [id number] Patient Account Number: [id number] Date of Birth/Sex: Treating RN: [birth year] (65 y.o. [name] [name], [name] Primary Care Ruvi Fullenwider:  [name] [name] Other Clinician: Referring Joelle Flessner: Treating Rumi Taras/Extender: [name] [name]: 14 Wound Status Wound Number: 3 Primary Diabetic Wound/Ulcer of the Lower Extremity Etiology: Wound Location: Right, Lateral Foot Wound Open Wounding Event: Gradually Appeared Status: Date Acquired: 06/21/2020 Comorbid Coronary Artery Disease, Hypertension, Type II Diabetes, Weeks Of [name]: 14 History: Osteoarthritis, Neuropathy Clustered Wound: No Photos Wound Measurements Length: (cm) 1.1 Width: (cm) 1.2 Depth: (cm) 0.4 Area: (cm) 1.037 Volume: (cm) 0.415 % Reduction in Area: 48.2% % Reduction in Volume: -3.5% Epithelialization: Medium (34-66%) Tunneling: No Undermining: Yes Starting Position (o'clock): 9 Ending Position (o'clock): 12 Maximum Distance: (cm) 0.3 Wound Description Classification: Grade 2 Wound Margin: Well defined, not attached Exudate Amount: Medium Exudate Type: Serosanguineous Exudate Color: red, brown Foul Odor After Cleansing: No Slough/Fibrino Yes Wound Bed Granulation Amount: Small (1-33%) Exposed Structure  Granulation Quality: Pink, Pale Fascia Exposed: No Necrotic Amount: Large (67-100%) Fat Layer (Subcutaneous Tissue) Exposed: Yes Necrotic Quality: Adherent Slough Tendon Exposed: No Muscle Exposed: No Joint Exposed: No Bone Exposed: No Assessment Notes maceration and callous present to periwound. [name] Notes Wound #3 (Foot) Wound Laterality: Right, Lateral Cleanser Soap and Water Discharge Instruction: May shower and wash wound with dial antibacterial soap and water prior to dressing change. Wound Cleanser Discharge Instruction: Cleanse the wound with wound cleanser prior to applying a clean dressing using gauze sponges, not tissue or cotton balls. Peri-Wound Care Topical Primary Dressing Hydrofera Blue Classic Foam, 4x4 in Discharge Instruction: Moisten with saline prior to applying to  wound bed Santyl Ointment Discharge Instruction: Apply nickel thick amount to wound bed as instructed Secondary Dressing Woven Gauze Sponge, Non-Sterile 4x4 in Discharge Instruction: Apply over primary dressing as directed. Secured With [employer], 4.5x3.1 (in/yd) Discharge Instruction: Secure with Kerlix as directed. 40M Medipore H Soft Cloth Surgical T 4 x 2 (in/yd) ape Discharge Instruction: Secure dressing with tape as directed. Compression Wrap Compression Stockings Add-Ons Electronic Signature(s) Signed: 10/13/2020 5:46:14 PM By: [name] Pilling RN, BSN Entered By: [name] [name] on 10/13/2020 09:38:54 -------------------------------------------------------------------------------- Vitals Details Patient Name: Date of Service: [name] [name] [facility] THA N L. 10/13/2020 9:15 A M Medical Record Number: [id number] Patient Account Number: [id number] Date of Birth/Sex: Treating RN: Jan 24, 1955 (65 y.o. [name] [name], [name] [name] Primary Care Amberlie Gaillard: [name] [name] Other Clinician: Referring Kiyoshi Schaab: Treating Theresa Wedel/Extender: [name] [name]: 14 Vital Signs Time Taken: 09:21 Temperature (F): 98.5 Height (in): 74 Pulse (bpm): 74 Weight (lbs): 151 Respiratory Rate (breaths/min): 16 Body Mass Index (BMI): 19.4 Blood Pressure (mmHg): 103/65 Reference Range: 80 - 120 mg / dl Electronic Signature(s) Signed: 10/13/2020 5:46:14 PM By: [name] Pilling RN, BSN Entered By: [name] [name] on 10/13/2020 19:16:60

## 2020-10-17 ENCOUNTER — Encounter: Payer: HMO | Admitting: Surgery

## 2020-10-17 ENCOUNTER — Other Ambulatory Visit: Payer: Self-pay

## 2020-10-17 ENCOUNTER — Telehealth: Payer: Self-pay | Admitting: Endocrinology

## 2020-10-17 DIAGNOSIS — E1042 Type 1 diabetes mellitus with diabetic polyneuropathy: Secondary | ICD-10-CM

## 2020-10-17 NOTE — Telephone Encounter (Signed)
Pt has been approved for Omnipod and would like a prescription sent to [place], [place], [place] [address] Pt contact [phone]

## 2020-10-18 LAB — AEROBIC/ANAEROBIC CULTURE W GRAM STAIN (SURGICAL/DEEP WOUND): Gram Stain: NONE SEEN

## 2020-10-18 NOTE — Telephone Encounter (Signed)
Authorization has been APPROVED. PLEASE SEE NOTE FROM 10.17.22

## 2020-10-19 DIAGNOSIS — L97519 Non-pressure chronic ulcer of other part of right foot with unspecified severity: Secondary | ICD-10-CM | POA: Diagnosis not present

## 2020-10-19 DIAGNOSIS — L97529 Non-pressure chronic ulcer of other part of left foot with unspecified severity: Secondary | ICD-10-CM | POA: Diagnosis not present

## 2020-10-19 DIAGNOSIS — L89154 Pressure ulcer of sacral region, stage 4: Secondary | ICD-10-CM | POA: Diagnosis not present

## 2020-10-19 DIAGNOSIS — L97429 Non-pressure chronic ulcer of left heel and midfoot with unspecified severity: Secondary | ICD-10-CM | POA: Diagnosis not present

## 2020-10-20 ENCOUNTER — Other Ambulatory Visit: Payer: Self-pay

## 2020-10-20 ENCOUNTER — Encounter (HOSPITAL_BASED_OUTPATIENT_CLINIC_OR_DEPARTMENT_OTHER): Payer: HMO | Admitting: Internal Medicine

## 2020-10-20 DIAGNOSIS — E11621 Type 2 diabetes mellitus with foot ulcer: Secondary | ICD-10-CM | POA: Diagnosis not present

## 2020-10-20 DIAGNOSIS — L97512 Non-pressure chronic ulcer of other part of right foot with fat layer exposed: Secondary | ICD-10-CM | POA: Diagnosis not present

## 2020-10-20 NOTE — Progress Notes (Signed)
KAZUTO, SEVEY ([id number]) Visit Report for 10/20/2020 Debridement Details Patient Name: Date of Service: [name] [name] [facility] THA N L. 10/20/2020 8:00 A M Medical Record Number: [id number] Patient Account Number: [id number] Date of Birth/Sex: Treating RN: September 02, 1955 (65 y.o. [name] Primary Care Provider: [name] Other Clinician: Referring Provider: Treating Provider/Extender: [name] in Treatment: 15 Debridement Performed for Assessment: Wound #3 Right,Lateral Foot Performed By: Physician Ricard Dillon., MD Debridement Type: Debridement Severity of Tissue Pre Debridement: Fat layer exposed Level of Consciousness (Pre-procedure): Awake and Alert Pre-procedure Verification/Time Out Yes - 08:15 Taken: Start Time: 08:15 Pain Control: Lidocaine 4% T opical Solution T Area Debrided (L x W): otal 1.1 (cm) x 1.4 (cm) = 1.54 (cm) Tissue and other material debrided: Viable, Non-Viable, Slough, Subcutaneous, Slough Level: Skin/Subcutaneous Tissue Debridement Description: Excisional Instrument: Curette Bleeding: Minimum Hemostasis Achieved: Pressure End Time: 08:18 Procedural Pain: 0 Post Procedural Pain: 0 Response to Treatment: Procedure was tolerated well Level of Consciousness (Post- Awake and Alert procedure): Post Debridement Measurements of Total Wound Length: (cm) 1.1 Width: (cm) 1.4 Depth: (cm) 0.1 Volume: (cm) 0.121 Character of Wound/Ulcer Post Debridement: Improved Severity of Tissue Post Debridement: Fat layer exposed Post Procedure Diagnosis Same as Pre-procedure Electronic Signature(s) Signed: 10/20/2020 5:12:42 PM By: Linton Ham MD Signed: 10/20/2020 5:50:03 PM By: Baruch Gouty RN, BSN Entered By: Baruch Gouty on 10/20/2020 08:20:01 -------------------------------------------------------------------------------- HPI Details Patient Name: Date of Service: [name] [name] [facility] THA N L. 10/20/2020 8:00 A  M Medical Record Number: [id number] Patient Account Number: [id number] Date of Birth/Sex: Treating RN: June 30, 1955 (65 y.o. [name] Primary Care Provider: [name] Other Clinician: Referring Provider: Treating Provider/Extender: [name] in Treatment: 15 History of Present Illness HPI Description: ADMISSION 07/07/2020 This is a 65 year old man who is a type II diabetic. I have reviewed this in epic. It appeared that his diabetes was diagnosed in 2009 at which time he was on metformin. He was followed for a period of time by an endocrinologist with [facility] to always labeled him as a type II diabetic. Recently in epic there is been a switch to type 1 diabetes. If there were defining antibody test done I do not see them and I think the totality of the evidence would suggest that he is a type II diabetic. His recent problem started in May when he fell down 17 stairs. He suffered a scapular fracture he was hospitalized at [facility] for 9 days and then discharged to [facility]. His mother who is present said that most of these wounds seem to develop surrounding the discharge from hospital or the stay at [facility]. He has an area on his lower sacrum which appears currently to be a stage II but per their description this is actually improved quite a bit. He has an area on the left lateral foot at roughly the base of the fifth metatarsal, the left Achilles heel and the right fifth metatarsal head laterally they have been using topical antibiotics. Seen by primary care yesterday and placed on Keflex The patient had arterial studies on 05/05/2020. I wondered whether this had something to do with wounds on his feet at that time but I have not been able to determine this. At that time his ABI on the right was 1.09 with a TBI of 0.65 on the left 0.88 with a TBI of 0.44. Waveforms were triphasic on the right monophasic on the left. He likely has significant PAD  on the  left Past medical history includes type 2 diabetes poorly controlled last hemoglobin A1c at 12.7 in May, hypertension, hyperlipidemia, atrial fib on Eliquis, coronary artery disease status post CABG, left scapular fracture in May of this year, AAA repair. Bilateral leg weakness ABIs were not done in our clinic as they were just done in May of this year at [facility]; patient arrives back in clinic he has areas on the left Achilles heel, base of the left fifth metatarsal, right lateral fifth metatarsal. The area he had over the coccyx appears to be healed but this area remains vulnerable. We have been using Iodoflex. I rereviewed his ABIs which were done in May. I do not know exactly what prompted these test however they do show a TBI of 0.44 on the left and monophasic waveforms. 7/21; the patient's wounds are about the same. Certainly no healing and the nurses noted some green drainage on the right fifth metatarsal head, left Achilles a little larger. We have been using silver alginate really without any improvement. We did get an appointment with vascular surgery however its that it did the end of August and they seem to want to repeat the noninvasive test that they have already done in May. I am not sure what the issue here is. We advised the patient's sister to call and point to the idea that they have already had noninvasive studies. The real question I have is does this patient need an angiogram. He is a diabetic and it is possible that the ABIs are falsely elevated because of medial calcification. At least on the left his TBI was abnormal 08/08/2020 upon evaluation today patient appears to be doing about the same in regard to his wounds. He still waiting the appointment with vascular. This is something that hopefully should be undertaken shortly. The 29th of this month is when he is actually scheduled although they have put in a call to move this up if at all possible. Fortunately  there does not appear to be any signs of active infection at this time. No fevers, chills, nausea, vomiting, or diarrhea. 8/18; appointment with Dr. Trula Slade on 8/29. The patient has wounds on the left Achilles heel, left lateral foot and the right lateral fifth metatarsal head. We have been using Santyl under Hydrofera Blue. The surface of especially the left heel looks better to me than what I remember. 9/1; since the patient was last here he underwent angiography by Dr. Trula Slade he also underwent noninvasive studies first showing a ABI of of 0.64 with monophasic waveforms. His TBI was 0.35 with a left great toe pressure of 46 this is about the same pattern as we saw previously. The left was a lot worse than the right. He underwent an angiogram on 08/29/2020. He had multiple 80% peroneal artery stenosis which was his dominant vessel which was treated with angioplasty with no residual stenosis. 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 The anterior tibial artery was also occluded. An attempt was made at recanalization of the anterior tibial artery however this was unsuccessful. Noted that the peroneal artery reconstitutes the posterior tibial artery and the anterior tibial artery creating the plantar arch. The patient has areas on the Achilles left heel left lateral foot and right lateral fifth metatarsal head we have been using Santyl and Hydrofera Blue 9/22; since the patient was last here he went for repeat vascular studies. On the right his ABI was 0.94 with biphasic waveforms TBI of 0.73  with a great toe pressure of 75. On the left which is the problematic wound his ABI was 1.08 but with biphasic waveforms at the PTA. Biphasic waveforms at the dorsalis pedis his TBI was 0.63 with a great toe pressure of 65. On the left however this does represent a fairly marked improvement. We have been using Santyl and Hydrofera Blue 9/29 left Achilles area. Still is having necrotic surface. Bleeds very  easily as the patient is on a combination of Eliquis aspirin and Plavix just stopped yesterday. I am not going to do any debridement until next week. We are using Santyl and Hydrofera Blue. His arterial status on the right was actually quite good. The area on the fifth metatarsal head needs additional debridement. A total contact cast in this area is not out of the question. He does describe claudication I believe with minimal activity. He is not very active X-ray we ordered last week is still pending in terms of the report 10/6 left Achilles area. In terms of the necrotic surface this is quite a bit better. There is still some debris in the posterior part of the wound however this looks better. Anteriorly the wound appears to be epithelialized there is still a divot in the middle of this but this does not probe to deep tissue. The area on the right fifth metatarsal head laterally really is not making as much progress as I would have liked. Still not completely a viable surface. Both the patient and his wife agree that he is not an external rotator in terms of ambulation [an outie]. We have been using Santyl and Hydrofera Blue for a prolonged period. On the left Achilles which looked like it is worse wound this seems to be making some progress not so much on the right foot. The patient and his wife state that they are rigorous about offloading both of these areas He was treated with angioplasty as I believe on the right peroneal artery that had multiple stenosis. This is a dominant vessel the anterior tibial and posterior tibial arteries are occluded however the foot is fed via collaterals from the peroneal. From my notes I am not able to understand the vascular supply on the left I will have to review this. 10/13; left Achilles top part of this looks healthy granulation in the bottom part of this wound again completely necrotic requiring debridement. The area on the right lateral fifth met head also  requires debridement. The majority of this is epithelialized however he she there is a refractory part to this. 10/20; left Achilles 100% better looking wound surface. Necrotic upper 50% seems to have resolved at least by inspection under illumination. Culture I did of this area last week postdebridement showed Proteus and Citrobacter. Both sensitive to quinolones and I put him on ciprofloxacin. We have been using silver alginate on the wound on the heel and Santyl and Hydrofera Blue on the right fifth met head. Electronic Signature(s) Signed: 10/20/2020 5:12:42 PM By: Linton Ham MD Entered By: Linton Ham on 10/20/2020 08:20:51 -------------------------------------------------------------------------------- Physical Exam Details Patient Name: Date of Service: [name] [name] [facility] THA N L. 10/20/2020 8:00 A M Medical Record Number: [id number] Patient Account Number: [id number] Date of Birth/Sex: Treating RN: 1955-10-24 (65 y.o. [name] Primary Care Provider: [name] Other Clinician: Referring Provider: Treating Provider/Extender: [name] in Treatment: 15 Constitutional Sitting or standing Blood Pressure is within target range for patient.. Pulse regular and within target range for patient.Marland Kitchen  Respirations regular, non-labored and within target range.. Temperature is normal and within the target range for the patient.Marland Kitchen Appears in no distress. Cardiovascular Pedal pulses palpable. Notes Wound exam; the area on the left Achilles looked a lot better. No debridement The area on the right fifth met head the same very adherent slough requiring a subcutaneous debridement hemostasis with direct pressure Electronic Signature(s) Signed: 10/20/2020 5:12:42 PM By: Linton Ham MD Entered By: Linton Ham on 10/20/2020 08:22:27 -------------------------------------------------------------------------------- Physician Orders Details Patient Name:  Date of Service: [name] [name] [facility] THA N L. 10/20/2020 8:00 A M Medical Record Number: [id number] Patient Account Number: [id number] Date of Birth/Sex: Treating RN: 16-Dec-1955 (65 y.o. [name] Primary Care Provider: [name] Other Clinician: Referring Provider: Treating Provider/Extender: [name] in Treatment: 15 Verbal / Phone Orders: No Diagnosis Coding ICD-10 Coding Code Description E11.621 Type 2 diabetes mellitus with foot ulcer E11.51 Type 2 diabetes mellitus with diabetic peripheral angiopathy without gangrene L97.528 Non-pressure chronic ulcer of other part of left foot with other specified severity L97.511 Non-pressure chronic ulcer of other part of right foot limited to breakdown of skin E11.42 Type 2 diabetes mellitus with diabetic polyneuropathy Follow-up Appointments ppointment in 1 week. - Dr. Dellia Nims Return A Bathing/ Shower/ Hygiene May shower and wash wound with soap and water. - with dressing changes only. Edema Control - Lymphedema / SCD / Other Elevate legs to the level of the heart or above for 30 minutes daily and/or when sitting, a frequency of: - 3-4 times a day throughout the day. Off-Loading Turn and reposition every 2 hours Other: - float heels while resting in bed or chair. Use a pillow or rolled sheet under legs to offload the heels. Home Health New wound care orders this week; continue Home Health for wound care. May utilize formulary equivalent dressing for wound treatment orders unless otherwise specified. - left heel wound add gentamicin cream to calcium alginate Ag every other day dressing changes. Other Home Health Orders/Instructions: - [facility] home health three times a week and family to change all other days. Wound Treatment Wound #1 - Calcaneus Wound Laterality: Left Cleanser: Soap and Water [facility]) Every Other Day/30 Days Discharge Instructions: May shower and wash wound with dial antibacterial  soap and water prior to dressing change. Cleanser: Wound Cleanser [facility]) Every Other Day/30 Days Discharge Instructions: Cleanse the wound with wound cleanser prior to applying a clean dressing using gauze sponges, not tissue or cotton balls. Topical: Gentamicin Every Other Day/30 Days Discharge Instructions: thin layer to wound bed with dressing changes Prim Dressing: KerraCel Ag Gelling Fiber Dressing, 4x5 in (silver alginate) (Home Health) Every Other Day/30 Days ary Discharge Instructions: Apply silver alginate lightly pack into depth of wound. Secondary Dressing: Woven Gauze Sponge, Non-Sterile 4x4 in [facility]) Every Other Day/30 Days Discharge Instructions: Apply over primary dressing as directed. Secondary Dressing: ALLEVYN Heel 4 1/2in x 5 1/2in / 10.5cm x 13.5cm [facility]) Every Other Day/30 Days Discharge Instructions: Apply over primary dressing as directed. Secured With: [employer], 4.5x3.1 (in/yd) [facility]) Every Other Day/30 Days Discharge Instructions: Secure with Kerlix as directed. Secured With: 49M Medipore H Soft Cloth Surgical T 4 x 2 (in/yd) (Home Health) Every Other Day/30 Days ape Discharge Instructions: Secure dressing with tape as directed. Wound #3 - Foot Wound Laterality: Right, Lateral Cleanser: Soap and Water (Home Health) 1 x Per Day/30 Days Discharge Instructions: May shower and wash wound with dial antibacterial soap and  water prior to dressing change. Cleanser: Wound Cleanser (Home Health) 1 x Per Day/30 Days Discharge Instructions: Cleanse the wound with wound cleanser prior to applying a clean dressing using gauze sponges, not tissue or cotton balls. Prim Dressing: Hydrofera Blue Classic Foam, 4x4 in (Home Health) 1 x Per Day/30 Days ary Discharge Instructions: Moisten with saline prior to applying to wound bed Prim Dressing: Santyl Ointment ([place]) 1 x Per Day/30 Days ary Discharge Instructions: Apply nickel thick amount  to wound bed as instructed Secondary Dressing: Woven Gauze Sponge, Non-Sterile 4x4 in (Home Health) 1 x Per Day/30 Days Discharge Instructions: Apply over primary dressing as directed. Secured With: [employer], 4.5x3.1 (in/yd) (Home Health) 1 x Per Day/30 Days Discharge Instructions: Secure with Kerlix as directed. Secured With: 66M Medipore H Soft Cloth Surgical T 4 x 2 (in/yd) (Home Health) 1 x Per Day/30 Days ape Discharge Instructions: Secure dressing with tape as directed. Electronic Signature(s) Signed: 10/20/2020 5:12:42 PM By: Linton Ham MD Signed: 10/20/2020 5:50:03 PM By: Baruch Gouty RN, BSN Entered By: Baruch Gouty on 10/20/2020 08:33:55 -------------------------------------------------------------------------------- Problem List Details Patient Name: Date of Service: [name] [name] [facility] THA N L. 10/20/2020 8:00 A M Medical Record Number: [id number] Patient Account Number: [id number] Date of Birth/Sex: Treating RN: 02-17-1955 (65 y.o. [name] Primary Care Provider: [name] Other Clinician: Referring Provider: Treating Provider/Extender: [name] in Treatment: 15 Active Problems ICD-10 Encounter Code Description Active Date MDM Diagnosis E11.621 Type 2 diabetes mellitus with foot ulcer 07/07/2020 No Yes E11.51 Type 2 diabetes mellitus with diabetic peripheral angiopathy without gangrene 09/01/2020 No Yes L97.528 Non-pressure chronic ulcer of other part of left foot with other specified 07/07/2020 No Yes severity L97.511 Non-pressure chronic ulcer of other part of right foot limited to breakdown of 07/07/2020 No Yes skin E11.42 Type 2 diabetes mellitus with diabetic polyneuropathy 07/07/2020 No Yes Inactive Problems ICD-10 Code Description Active Date Inactive Date L89.152 Pressure ulcer of sacral region, stage 2 07/07/2020 07/07/2020 Z60.109 Pressure ulcer of left heel, stage 2 07/07/2020 07/07/2020 Resolved  Problems Electronic Signature(s) Signed: 10/20/2020 5:12:42 PM By: Linton Ham MD Entered By: Linton Ham on 10/20/2020 08:19:39 -------------------------------------------------------------------------------- Progress Note Details Patient Name: Date of Service: [name] [name] [facility] THA N L. 10/20/2020 8:00 A M Medical Record Number: [id number] Patient Account Number: [id number] Date of Birth/Sex: Treating RN: Apr 04, 1955 (65 y.o. [name] Primary Care Provider: [name] Other Clinician: Referring Provider: Treating Provider/Extender: [name] in Treatment: 15 Subjective History of Present Illness (HPI) ADMISSION 07/07/2020 This is a 65 year old man who is a type II diabetic. I have reviewed this in epic. It appeared that his diabetes was diagnosed in 2009 at which time he was on metformin. He was followed for a period of time by an endocrinologist with [facility] to always labeled him as a type II diabetic. Recently in epic there is been a switch to type 1 diabetes. If there were defining antibody test done I do not see them and I think the totality of the evidence would suggest that he is a type II diabetic. His recent problem started in May when he fell down 17 stairs. He suffered a scapular fracture he was hospitalized at [facility] for 9 days and then discharged to [facility]. His mother who is present said that most of these wounds seem to develop surrounding the discharge from hospital or the stay at [facility]. He has an area on his lower  sacrum which appears currently to be a stage II but per their description this is actually improved quite a bit. He has an area on the left lateral foot at roughly the base of the fifth metatarsal, the left Achilles heel and the right fifth metatarsal head laterally they have been using topical antibiotics. Seen by primary care yesterday and placed on Keflex The patient had arterial studies on 05/05/2020. I  wondered whether this had something to do with wounds on his feet at that time but I have not been able to determine this. At that time his ABI on the right was 1.09 with a TBI of 0.65 on the left 0.88 with a TBI of 0.44. Waveforms were triphasic on the right monophasic on the left. He likely has significant PAD on the left Past medical history includes type 2 diabetes poorly controlled last hemoglobin A1c at 12.7 in May, hypertension, hyperlipidemia, atrial fib on Eliquis, coronary artery disease status post CABG, left scapular fracture in May of this year, AAA repair. Bilateral leg weakness ABIs were not done in our clinic as they were just done in May of this year at [facility] 7/14; patient arrives back in clinic he has areas on the left Achilles heel, base of the left fifth metatarsal, right lateral fifth metatarsal. The area he had over the coccyx appears to be healed but this area remains vulnerable. We have been using Iodoflex. I rereviewed his ABIs which were done in May. I do not know exactly what prompted these test however they do show a TBI of 0.44 on the left and monophasic waveforms. 7/21; the patient's wounds are about the same. Certainly no healing and the nurses noted some green drainage on the right fifth metatarsal head, left Achilles a little larger. We have been using silver alginate really without any improvement. We did get an appointment with vascular surgery however its that it did the end of August and they seem to want to repeat the noninvasive test that they have already done in May. I am not sure what the issue here is. We advised the patient's sister to call and point to the idea that they have already had noninvasive studies. The real question I have is does this patient need an angiogram. He is a diabetic and it is possible that the ABIs are falsely elevated because of medial calcification. At least on the left his TBI was abnormal 08/08/2020 upon evaluation  today patient appears to be doing about the same in regard to his wounds. He still waiting the appointment with vascular. This is something that hopefully should be undertaken shortly. The 29th of this month is when he is actually scheduled although they have put in a call to move this up if at all possible. Fortunately there does not appear to be any signs of active infection at this time. No fevers, chills, nausea, vomiting, or diarrhea. 8/18; appointment with Dr. Trula Slade on 8/29. The patient has wounds on the left Achilles heel, left lateral foot and the right lateral fifth metatarsal head. We have been using Santyl under Hydrofera Blue. The surface of especially the left heel looks better to me than what I remember. 9/1; since the patient was last here he underwent angiography by Dr. Trula Slade he also underwent noninvasive studies first showing a ABI of of 0.64 with monophasic waveforms. His TBI was 0.35 with a left great toe pressure of 46 this is about the same pattern as we saw previously. The left  was a lot worse than the right. He underwent an angiogram on 08/29/2020. He had multiple 80% peroneal artery stenosis which was his dominant vessel which was treated with angioplasty with no residual stenosis. 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 The anterior tibial artery was also occluded. An attempt was made at recanalization of the anterior tibial artery however this was unsuccessful. Noted that the peroneal artery reconstitutes the posterior tibial artery and the anterior tibial artery creating the plantar arch. The patient has areas on the Achilles left heel left lateral foot and right lateral fifth metatarsal head we have been using Santyl and Hydrofera Blue 9/22; since the patient was last here he went for repeat vascular studies. On the right his ABI was 0.94 with biphasic waveforms TBI of 0.73 with a great toe pressure of 75. On the left which is the problematic wound his ABI was 1.08  but with biphasic waveforms at the PTA. Biphasic waveforms at the dorsalis pedis his TBI was 0.63 with a great toe pressure of 65. On the left however this does represent a fairly marked improvement. We have been using Santyl and Hydrofera Blue 9/29 left Achilles area. Still is having necrotic surface. Bleeds very easily as the patient is on a combination of Eliquis aspirin and Plavix just stopped yesterday. I am not going to do any debridement until next week. We are using Santyl and Hydrofera Blue. His arterial status on the right was actually quite good. The area on the fifth metatarsal head needs additional debridement. A total contact cast in this area is not out of the question. He does describe claudication I believe with minimal activity. He is not very active X-ray we ordered last week is still pending in terms of the report 10/6 left Achilles area. In terms of the necrotic surface this is quite a bit better. There is still some debris in the posterior part of the wound however this looks better. Anteriorly the wound appears to be epithelialized there is still a divot in the middle of this but this does not probe to deep tissue. The area on the right fifth metatarsal head laterally really is not making as much progress as I would have liked. Still not completely a viable surface. Both the patient and his wife agree that he is not an external rotator in terms of ambulation [an outie]. We have been using Santyl and Hydrofera Blue for a prolonged period. On the left Achilles which looked like it is worse wound this seems to be making some progress not so much on the right foot. The patient and his wife state that they are rigorous about offloading both of these areas He was treated with angioplasty as I believe on the right peroneal artery that had multiple stenosis. This is a dominant vessel the anterior tibial and posterior tibial arteries are occluded however the foot is fed via collaterals  from the peroneal. From my notes I am not able to understand the vascular supply on the left I will have to review this. 10/13; left Achilles top part of this looks healthy granulation in the bottom part of this wound again completely necrotic requiring debridement. oo The area on the right lateral fifth met head also requires debridement. The majority of this is epithelialized however he she there is a refractory part to this. 10/20; left Achilles 100% better looking wound surface. Necrotic upper 50% seems to have resolved at least by inspection under illumination. Culture I did of this area  last week postdebridement showed Proteus and Citrobacter. Both sensitive to quinolones and I put him on ciprofloxacin. We have been using silver alginate on the wound on the heel and Santyl and Hydrofera Blue on the right fifth met head. Objective Constitutional Sitting or standing Blood Pressure is within target range for patient.. Pulse regular and within target range for patient.Marland Kitchen Respirations regular, non-labored and within target range.. Temperature is normal and within the target range for the patient.Marland Kitchen Appears in no distress. Vitals Time Taken: 7:59 AM, Height: 74 in, Source: Stated, Weight: 151 lbs, Source: Stated, BMI: 19.4, Temperature: 98.5 F, Pulse: 75 bpm, Respiratory Rate: 18 breaths/min, Blood Pressure: 121/75 mmHg. General Notes: pt states that glucose >300 yesterday morning Cardiovascular Pedal pulses palpable. General Notes: Wound exam; the area on the left Achilles looked a lot better. No debridement oo The area on the right fifth met head the same very adherent slough requiring a subcutaneous debridement hemostasis with direct pressure Integumentary (Hair, Skin) Wound #1 status is Open. Original cause of wound was Pressure Injury. The date acquired was: 05/23/2020. The wound has been in treatment 15 weeks. The wound is located on the Left Calcaneus. The wound measures 2.4cm length x  1.8cm width x 1cm depth; 3.393cm^2 area and 3.393cm^3 volume. There is Fat Layer (Subcutaneous Tissue) exposed. There is no tunneling noted, however, there is undermining starting at 5:00 and ending at 8:00 with a maximum distance of 0.9cm. There is a medium amount of serosanguineous drainage noted. The wound margin is distinct with the outline attached to the wound base. There is large (67-100%) pink, pale granulation within the wound bed. There is a small (1-33%) amount of necrotic tissue within the wound bed including Adherent Slough. Wound #3 status is Open. Original cause of wound was Gradually Appeared. The date acquired was: 06/21/2020. The wound has been in treatment 15 weeks. The wound is located on the Right,Lateral Foot. The wound measures 1.1cm length x 1.4cm width x 0.1cm depth; 1.21cm^2 area and 0.121cm^3 volume. There is Fat Layer (Subcutaneous Tissue) exposed. There is no tunneling or undermining noted. There is a medium amount of serosanguineous drainage noted. The wound margin is well defined and not attached to the wound base. There is small (1-33%) pink, pale granulation within the wound bed. There is a large (67-100%) amount of necrotic tissue within the wound bed including Adherent Slough. 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 There was a Excisional Skin/Subcutaneous Tissue Debridement with a total area of 1.54 sq cm performed by Ricard Dillon.,  MD. With the following instrument(s): Curette to remove Viable and Non-Viable tissue/material. Material removed includes Subcutaneous Tissue and Slough and after achieving pain control using Lidocaine 4% Topical Solution. No specimens were taken. A time out was conducted at 08:15, prior to the start of the procedure. A Minimum amount of bleeding was controlled with Pressure. The procedure was tolerated well with a pain level of 0 throughout and a pain level of 0 following the procedure. Post Debridement Measurements: 1.1cm length x 1.4cm width x  0.1cm depth; 0.121cm^3 volume. Character of Wound/Ulcer Post Debridement is improved. Severity of Tissue Post Debridement is: Fat layer exposed. Post procedure Diagnosis Wound #3: Same as Pre-Procedure Plan Follow-up Appointments: Return Appointment in 1 week. - Dr. Dellia Nims Bathing/ Shower/ Hygiene: May shower and wash wound with soap and water. - with dressing changes only. Edema Control - Lymphedema / SCD / Other: Elevate legs to the level of the heart or above for 30 minutes daily and/or when sitting, a frequency of: - 3-4 times a day throughout the day. Off-Loading: Turn and reposition every 2 hours Other: - float heels while resting in bed or chair. Use a pillow or rolled sheet under legs to offload the heels. Home Health: New wound care orders this week; continue Home Health for wound care. May utilize formulary equivalent dressing for wound treatment orders unless otherwise specified. - left heel wound add gentamicin cream to calcium alginate Ag every other day dressing changes. Other Home Health Orders/Instructions: - [facility] home health three times a week and family to change all other days. WOUND #1: - Calcaneus Wound Laterality: Left Cleanser: Soap and Water [facility]) Every Other Day/30 Days Discharge Instructions: May shower and wash wound with dial antibacterial soap and water prior to dressing change. Cleanser: Wound Cleanser [employer]) Every Other Day/30 Days Discharge Instructions: Cleanse the wound with wound cleanser prior to applying a clean dressing using gauze sponges, not tissue or cotton balls. Topical: Gentamicin Every Other Day/30 Days Discharge Instructions: thin layer to wound bed with dressing changes Prim Dressing: KerraCel Ag Gelling Fiber Dressing, 4x5 in (silver alginate) (Home Health) Every Other Day/30 Days ary Discharge Instructions: Apply silver alginate lightly pack into depth of wound. Secondary Dressing: Woven Gauze Sponge, Non-Sterile 4x4 in [facility]) Every Other Day/30 Days Discharge Instructions: Apply over primary dressing as directed. Secondary Dressing: ALLEVYN Heel 4 1/2in x 5 1/2in / 10.5cm x 13.5cm [facility]) Every Other Day/30 Days Discharge Instructions: Apply over primary dressing as directed. Secured With: [employer], 4.5x3.1 (in/yd) [facility]) Every Other Day/30 Days Discharge Instructions: Secure with Kerlix as directed. Secured With: 60M Medipore H Soft Cloth Surgical T 4 x 2 (in/yd) (Home Health) Every Other Day/30 Days ape Discharge Instructions: Secure dressing with tape as directed. WOUND #3: - Foot Wound Laterality: Right, Lateral Cleanser: Soap and Water (Home Health) 1 x Per Day/30 Days Discharge Instructions: May shower and wash wound with dial antibacterial soap and water prior to dressing change. Cleanser: Wound Cleanser (Home Health) 1 x Per Day/30 Days Discharge Instructions: Cleanse the wound with wound cleanser prior to applying a clean dressing using gauze sponges, not tissue or cotton balls. Prim Dressing: Hydrofera Blue Classic Foam, 4x4 in (Home Health) 1 x Per Day/30 Days ary Discharge Instructions: Moisten with saline prior to applying to wound bed Prim Dressing: Santyl Ointment ([place]) 1 x Per Day/30 Days ary Discharge Instructions: Apply nickel thick amount to wound bed as instructed Secondary Dressing: Woven Gauze Sponge,  Non-Sterile 4x4 in (Home Health) 1 x Per Day/30 Days Discharge Instructions: Apply over primary dressing as directed. Secured With: [employer], 4.5x3.1 (in/yd) (Home Health) 1 x Per Day/30 Days Discharge Instructions: Secure with Kerlix as directed. Secured With: 60M Medipore H Soft Cloth Surgical T 4 x 2 (in/yd) (Home Health) 1 x Per Day/30 Days ape Discharge Instructions: Secure dressing with tape as directed. #1 I am applying topical gentamicin under the silver alginate on the left heel 2. Still Hydrofera Blue  and Santyl on the right fifth met head. This has a rim of epithelialization but the overall wound surface has not gotten any better. 3. He is on ciprofloxacin. He talks about chronic problems with diarrhea. I am not sure of the exact diagnosis here he says he had a colonoscopy a year ago showing multiple polyps. I cautioned him that ciprofloxacin can cause diarrhea I would be concerned about more than 3 liquid bowel movements per day for at least 2 days and I asked him to call if this happens 4. When we call the patient about the antibiotic and the culture results he became concerned that he was "septic". I took some time to talk about septic and sepsis explaining that this is not what I was concerned about. We then talked about the concept of biofilm and its impairment on wound healing 5 patient sees vascular surgery next week not earlier this week as I initially had a my note Electronic Signature(s) Signed: 10/20/2020 5:12:42 PM By: Linton Ham MD Signed: 10/20/2020 5:50:03 PM By: Baruch Gouty RN, BSN Entered By: Baruch Gouty on 10/20/2020 08:34:06 -------------------------------------------------------------------------------- SuperBill Details Patient Name: Date of Service: [name] [name] HNA THA N L. 10/20/2020 Medical Record Number: [id number] Patient Account Number: [id number] Date of Birth/Sex: Treating RN: [birth year] (65 y.o. [name] Primary Care  Provider: [name] Other Clinician: Referring Provider: Treating Provider/Extender: [name] in Treatment: 15 Diagnosis Coding ICD-10 Codes Code Description [id number] Type 2 diabetes mellitus with foot ulcer E11.51 Type 2 diabetes mellitus with diabetic peripheral angiopathy without gangrene L97.528 Non-pressure chronic ulcer of other part of left foot with other specified severity L97.511 Non-pressure chronic ulcer of other part of right foot limited to breakdown of skin E11.42 Type 2 diabetes mellitus with diabetic polyneuropathy Facility Procedures CPT4 Code: [id number] Description: 01561 - DEB SUBQ TISSUE 20 SQ CM/< ICD-10 Diagnosis Description L97.511 Non-pressure chronic ulcer of other part of right foot limited to breakdown of Modifier: skin Quantity: 1 Physician Procedures : CPT4 Code Description Modifier [id number] - WC PHYS SUBQ TISS 20 SQ CM ICD-10 Diagnosis Description L97.511 Non-pressure chronic ulcer of other part of right foot limited to breakdown of skin Quantity: 1 Electronic Signature(s) Signed: 10/20/2020 5:12:42 PM By: Linton Ham MD Entered By: Linton Ham on 10/20/2020 08:25:49

## 2020-10-24 ENCOUNTER — Ambulatory Visit: Payer: HMO | Admitting: Internal Medicine

## 2020-10-24 ENCOUNTER — Encounter (HOSPITAL_COMMUNITY): Payer: HMO

## 2020-10-24 ENCOUNTER — Encounter: Payer: HMO | Admitting: Surgery

## 2020-10-25 ENCOUNTER — Encounter: Payer: Self-pay | Admitting: Internal Medicine

## 2020-10-25 DIAGNOSIS — E113491 Type 2 diabetes mellitus with severe nonproliferative diabetic retinopathy without macular edema, right eye: Secondary | ICD-10-CM | POA: Diagnosis not present

## 2020-10-25 DIAGNOSIS — H2513 Age-related nuclear cataract, bilateral: Secondary | ICD-10-CM | POA: Diagnosis not present

## 2020-10-25 DIAGNOSIS — L89150 Pressure ulcer of sacral region, unstageable: Secondary | ICD-10-CM | POA: Diagnosis not present

## 2020-10-25 DIAGNOSIS — H524 Presbyopia: Secondary | ICD-10-CM | POA: Diagnosis not present

## 2020-10-25 DIAGNOSIS — H35033 Hypertensive retinopathy, bilateral: Secondary | ICD-10-CM | POA: Diagnosis not present

## 2020-10-25 DIAGNOSIS — E113592 Type 2 diabetes mellitus with proliferative diabetic retinopathy without macular edema, left eye: Secondary | ICD-10-CM | POA: Diagnosis not present

## 2020-10-25 DIAGNOSIS — S42102D Fracture of unspecified part of scapula, left shoulder, subsequent encounter for fracture with routine healing: Secondary | ICD-10-CM | POA: Diagnosis not present

## 2020-10-25 LAB — HM DIABETES EYE EXAM

## 2020-10-25 NOTE — Progress Notes (Signed)
[name] [name] ([id number]) Visit Report for 10/20/2020 Arrival Information Details Patient Name: Date of Service: [name] [name] [facility] THA N [name]. 10/20/2020 8:00 A M Medical Record Number: [id number] Patient Account Number: [id number] Date of Birth/Sex: Treating RN: [birth year] (65 y.o. [name] [name] Primary Care [name] [name]: [name] [name] Other Clinician: Referring [name] [name]: Treating [name] [name]/Extender: [name] [name] in Treatment: 15 Visit Information History Since Last Visit Added or deleted any medications: No Patient Arrived: Wheel Chair Any new allergies or adverse reactions: No Arrival Time: 07:55 Had a fall or experienced change in No Accompanied By: mother activities of daily living that may affect Transfer Assistance: Manual risk of falls: Patient Identification Verified: Yes Signs or symptoms of abuse/neglect since last visito No Secondary Verification Process Completed: Yes Hospitalized since last visit: No Patient Requires Transmission-Based Precautions: No Implantable device outside of the clinic excluding No Patient Has Alerts: Yes cellular tissue based products placed in the center Patient Alerts: Patient on Blood Thinner since last visit: Has Dressing in Place as Prescribed: Yes Pain Present Now: Yes Electronic Signature(s) Signed: 10/20/2020 5:50:03 PM By: [name] Gouty RN, BSN Entered By: [name] [name] on 10/20/2020 07:59:31 -------------------------------------------------------------------------------- Encounter Discharge Information Details Patient Name: Date of Service: [name] [name]. 10/20/2020 8:00 A M Medical Record Number: [id number] Patient Account Number: [id number] Date of Birth/Sex: Treating RN: [name] [name] (65 y.o. [name] [name] Primary Care [name] [name]: [name] [name] Other Clinician: Referring Baden Betsch: Treating Teniyah Seivert/Extender: [name] [name] in Treatment: 15 Encounter Discharge  Information Items Post Procedure Vitals Discharge Condition: Stable Temperature (F): 98.5 Ambulatory Status: Wheelchair Pulse (bpm): 75 Discharge Destination: Home Respiratory Rate (breaths/min): 18 Transportation: Private Auto Blood Pressure (mmHg): 121/75 Accompanied By: mother Schedule Follow-up Appointment: Yes Clinical Summary of Care: Patient Declined Electronic Signature(s) Signed: 10/20/2020 5:50:03 PM By: [name] Gouty RN, BSN Entered By: [name] [name] on 10/20/2020 08:36:11 -------------------------------------------------------------------------------- Lower Extremity Assessment Details Patient Name: Date of Service: [name] [name]. 10/20/2020 8:00 A M Medical Record Number: [id number] Patient Account Number: [id number] Date of Birth/Sex: Treating RN: [name] (65 y.o. [name] [name] Primary Care Elmar Antigua: [name] [name] Other Clinician: Referring Tyara Dassow: Treating Zachry Hopfensperger/Extender: [name] [name] in Treatment: 15 Edema Assessment Assessed: [name] [name]: No] [name] [name]: No] Edema: [Left: Yes] [Right: No] Calf Left: Right: Point of Measurement: 34 cm From Medial Instep 29.5 cm 29.5 cm Ankle Left: Right: Point of Measurement: 10 cm From Medial Instep 22.5 cm 21.6 cm Vascular Assessment Pulses: Dorsalis Pedis Palpable: [Left:Yes] [Right:No] Electronic Signature(s) Signed: 10/20/2020 5:50:03 PM By: [name] Gouty RN, BSN Entered By: [name] [name] on 10/20/2020 08:03:04 -------------------------------------------------------------------------------- Multi Wound Chart Details Patient Name: Date of Service: [name] [name]. 10/20/2020 8:00 A M Medical Record Number: [id number] Patient Account Number: [id number] Date of Birth/Sex: Treating RN: [name] [name] (65 y.o. [name] [name] Primary Care Adekunle Rohrbach: [name] [name] Other Clinician: Referring Manoj Enriquez: Treating Maylani Embree/Extender: [name] [name] in  Treatment: 15 Vital Signs Height(in): 31 Pulse(bpm): 61 Weight(lbs): 151 Blood Pressure(mmHg): 121/75 Body Mass Index(BMI): 19 Temperature(F): 98.5 Respiratory Rate(breaths/min): 18 Photos: [1:Left Calcaneus] [3:Right, Lateral Foot] [N/A:N/A N/A] Wound Location: [1:Pressure Injury] [3:Gradually Appeared] [N/A:N/A] Wounding Event: [1:Pressure Ulcer] [3:Diabetic Wound/Ulcer of the Lower] [N/A:N/A] Primary Etiology: [1:Coronary Artery Disease,] [3:Extremity Coronary Artery Disease,] [N/A:N/A] Comorbid History: [1:Hypertension, Type II Diabetes, Osteoarthritis, Neuropathy 05/23/2020] [3:Hypertension, Type II Diabetes, Osteoarthritis, Neuropathy 06/21/2020] [N/A:N/A] Date Acquired: [1:15] [3:15] [N/A:N/A] Weeks of Treatment: [1:Open] [3:Open] [N/A:N/A] Wound Status: [1:2.4x1.8x1] [3:1.1x1.4x0.1] [N/A:N/A]  Measurements [name] x W x D (cm) [1:3.393] [3:1.21] [N/A:N/A] A (cm) : rea [1:3.393] [3:0.121] [N/A:N/A] Volume (cm) : [1:73.00%] [3:39.60%] [N/A:N/A] % Reduction in A rea: [1:-35.00%] [3:69.80%] [N/A:N/A] % Reduction in Volume: [1:5] Starting Position 1 (o'clock): [1:8] Ending Position 1 (o'clock): [1:0.9] Maximum Distance 1 (cm): [1:Yes] [3:No] [N/A:N/A] Undermining: [1:Category/Stage IV] [3:Grade 2] [N/A:N/A] Classification: [1:Medium] [3:Medium] [N/A:N/A] Exudate A mount: [1:Serosanguineous] [3:Serosanguineous] [N/A:N/A] Exudate Type: [1:red, brown] [3:red, brown] [N/A:N/A] Exudate Color: [1:Distinct, outline attached] [3:Well defined, not attached] [N/A:N/A] Wound Margin: [1:Large (67-100%)] [3:Small (1-33%)] [N/A:N/A] Granulation A mount: [1:Pink, Pale] [3:Pink, Pale] [N/A:N/A] Granulation Quality: [1:Small (1-33%)] [3:Large (67-100%)] [N/A:N/A] Necrotic A mount: [1:Fat Layer (Subcutaneous Tissue): Yes Fat Layer (Subcutaneous Tissue): Yes N/A] Exposed Structures: [1:Fascia: No Tendon: No Muscle: No Joint: No Bone: No Small (1-33%)] [3:Fascia: No Tendon: No Muscle: No Joint: No  Bone: No Small (1-33%)] [N/A:N/A] Treatment Notes Electronic Signature(s) Signed: 10/20/2020 5:12:42 PM By: Linton Ham MD Signed: 10/20/2020 5:50:03 PM By: [name] Gouty RN, BSN Entered By: Linton Ham on 10/20/2020 08:19:49 -------------------------------------------------------------------------------- Multi-Disciplinary Care Plan Details Patient Name: Date of Service: [name] [name]. 10/20/2020 8:00 A M Medical Record Number: [id number] Patient Account Number: [id number] Date of Birth/Sex: Treating RN: [name] (65 y.o. [name] [name] Primary Care Erisha Paugh: [name] [name] Other Clinician: Referring Maleeka Sabatino: Treating Aziya Arena/Extender: [name] [name] in Treatment: 15 Active Inactive Necrotic Tissue Nursing Diagnoses: Impaired tissue integrity related to necrotic/devitalized tissue Knowledge deficit related to management of necrotic/devitalized tissue Goals: Necrotic/devitalized tissue will be minimized in the wound bed Date Initiated: 09/22/2020 Target Resolution Date: 11/04/2020 Goal Status: Active Patient/caregiver will verbalize understanding of reason and process for debridement of necrotic tissue Date Initiated: 09/22/2020 Date Inactivated: 10/06/2020 Target Resolution Date: 10/28/2020 Goal Status: Met Interventions: Provide education on necrotic tissue and debridement process Treatment Activities: Enzymatic debridement : 09/22/2020 Excisional debridement : 09/22/2020 Notes: Pain, Acute or Chronic Nursing Diagnoses: Pain, acute or chronic: actual or potential Potential alteration in comfort, pain Goals: Patient will verbalize adequate pain control and receive pain control interventions during procedures as needed Date Initiated: 07/07/2020 Target Resolution Date: 10/27/2020 Goal Status: Active Patient/caregiver will verbalize comfort level met Date Initiated: 07/07/2020 Date Inactivated: 09/22/2020 Target Resolution Date:  09/30/2020 Goal Status: Met Interventions: Encourage patient to take pain medications as prescribed Provide education on pain management Reposition patient for comfort Treatment Activities: Administer pain control measures as ordered : 07/07/2020 Notes: Wound/Skin Impairment Nursing Diagnoses: Knowledge deficit related to ulceration/compromised skin integrity Goals: Patient/caregiver will verbalize understanding of skin care regimen Date Initiated: 07/07/2020 Target Resolution Date: 11/25/2020 Goal Status: Active Interventions: Assess patient/caregiver ability to obtain necessary supplies Assess patient/caregiver ability to perform ulcer/skin care regimen upon admission and as needed Provide education on ulcer and skin care Treatment Activities: Skin care regimen initiated : 07/07/2020 Topical wound management initiated : 07/07/2020 Notes: Electronic Signature(s) Signed: 10/20/2020 5:50:03 PM By: [name] Gouty RN, BSN Entered By: [name] [name] on 10/20/2020 08:07:45 -------------------------------------------------------------------------------- Pain Assessment Details Patient Name: Date of Service: [name] [name]. 10/20/2020 8:00 A M Medical Record Number: [id number] Patient Account Number: [id number] Date of Birth/Sex: Treating RN: [birth year] [name] (65 y.o. [name] [name] Primary Care Melani Brisbane: [name] [name] Other Clinician: Referring Anvay Tennis: Treating Shalamar Crays/Extender: [name] [name] in Treatment: 15 Active Problems Location of Pain Severity and Description of Pain Patient Has Paino Yes Site Locations Pain Location: Generalized Pain, Pain in Ulcers With Dressing Change: Yes Duration of the Pain. Constant / Intermittento  Constant Rate the pain. Current Pain Level: 8 Character of Pain Describe the Pain: Aching Pain Management and Medication Current Pain Management: Medication: Yes Is the Current Pain Management Adequate: Adequate How  does your wound impact your activities of daily livingo Sleep: Yes Bathing: No Appetite: No Relationship With Others: No Bladder Continence: No Emotions: Yes Bowel Continence: No Work: No Toileting: No Drive: No Dressing: No Hobbies: No Electronic Signature(s) Signed: 10/20/2020 5:50:03 PM By: [name] Gouty RN, BSN Entered By: [name] [name] on 10/20/2020 08:01:58 -------------------------------------------------------------------------------- Patient/Caregiver Education Details Patient Name: Date of Service: [name] [name]. 10/20/2022andnbsp8:00 A M Medical Record Number: [id number] Patient Account Number: [id number] Date of Birth/Gender: Treating RN: [name] (65 y.o. [name] [name] Primary Care Physician: [name] [name] Other Clinician: Referring Physician: Treating Physician/Extender: [name] [name] in Treatment: 15 Education Assessment Education Provided To: Patient Education Topics Provided Infection: Methods: Explain/Verbal Responses: Reinforcements needed, State content correctly Pain: Methods: Explain/Verbal Responses: Reinforcements needed, State content correctly Wound Debridement: Methods: Explain/Verbal Responses: Reinforcements needed, State content correctly Wound/Skin Impairment: Methods: Explain/Verbal Responses: State content correctly Electronic Signature(s) Signed: 10/20/2020 5:50:03 PM By: [name] Gouty RN, BSN Entered By: [name] [name] on 10/20/2020 08:08:18 -------------------------------------------------------------------------------- Wound Assessment Details Patient Name: Date of Service: [name] [name]. 10/20/2020 8:00 A M Medical Record Number: [id number] Patient Account Number: [id number] Date of Birth/Sex: Treating RN: [name] (65 y.o. [name] [name] Primary Care Rayvin Abid: [name] [name] Other Clinician: Referring Tamula Morrical: Treating Jabrea Kallstrom/Extender: [name] [name] in Treatment: 15 Wound Status Wound Number: 1 Primary Pressure Ulcer Etiology: Wound Location: Left Calcaneus Wound Open Wounding Event: Pressure Injury Status: Date Acquired: 05/23/2020 Comorbid Coronary Artery Disease, Hypertension, Type II Diabetes, Weeks Of Treatment: 15 History: Osteoarthritis, Neuropathy Clustered Wound: No Photos Wound Measurements Length: (cm) 2.4 Width: (cm) 1.8 Depth: (cm) 1 Area: (cm) 3.393 Volume: (cm) 3.393 % Reduction in Area: 73% % Reduction in Volume: -35% Epithelialization: Small (1-33%) Tunneling: No Undermining: Yes Starting Position (o'clock): 5 Ending Position (o'clock): 8 Maximum Distance: (cm) 0.9 Wound Description Classification: Category/Stage IV Wound Margin: Distinct, outline attached Exudate Amount: Medium Exudate Type: Serosanguineous Exudate Color: red, brown Foul Odor After Cleansing: No Slough/Fibrino Yes Wound Bed Granulation Amount: Large (67-100%) Exposed Structure Granulation Quality: Pink, Pale Fascia Exposed: No Necrotic Amount: Small (1-33%) Fat Layer (Subcutaneous Tissue) Exposed: Yes Necrotic Quality: Adherent Slough Tendon Exposed: No Muscle Exposed: No Joint Exposed: No Bone Exposed: No Treatment Notes Wound #1 (Calcaneus) Wound Laterality: Left Cleanser Soap and Water Discharge Instruction: May shower and wash wound with dial antibacterial soap and water prior to dressing change. Wound Cleanser Discharge Instruction: Cleanse the wound with wound cleanser prior to applying a clean dressing using gauze sponges, not tissue or cotton balls. Peri-Wound Care Topical Gentamicin Discharge Instruction: thin layer to wound bed with dressing changes Primary Dressing KerraCel Ag Gelling Fiber Dressing, 4x5 in (silver alginate) Discharge Instruction: Apply silver alginate lightly pack into depth of wound. Secondary Dressing Woven Gauze Sponge, Non-Sterile 4x4 in Discharge Instruction: Apply  over primary dressing as directed. ALLEVYN Heel 4 1/2in x 5 1/2in / 10.5cm x 13.5cm Discharge Instruction: Apply over primary dressing as directed. Secured With [employer], 4.5x3.1 (in/yd) Discharge Instruction: Secure with Kerlix as directed. 15M Medipore H Soft Cloth Surgical T 4 x 2 (in/yd) ape Discharge Instruction: Secure dressing with tape as directed. Compression Wrap Compression Stockings Add-Ons Electronic Signature(s) Signed: 10/20/2020 5:50:03 PM By: [name] Gouty RN, BSN Signed: 10/25/2020  2:37:05 PM By: Sandre Kitty Entered By: Sandre Kitty on 10/20/2020 08:06:29 -------------------------------------------------------------------------------- Wound Assessment Details Patient Name: Date of Service: [name] [name]. 10/20/2020 8:00 A M Medical Record Number: [id number] Patient Account Number: [id number] Date of Birth/Sex: Treating RN: [birth year] [name] (65 y.o. [name] [name] Primary Care Carlise Stofer: [name] [name] Other Clinician: Referring Ciarah Peace: Treating Noah Pelaez/Extender: [name] [name] in Treatment: 15 Wound Status Wound Number: 3 Primary Diabetic Wound/Ulcer of the Lower Extremity Etiology: Wound Location: Right, Lateral Foot Wound Open Wounding Event: Gradually Appeared Status: Date Acquired: 06/21/2020 Comorbid Coronary Artery Disease, Hypertension, Type II Diabetes, Weeks Of Treatment: 15 History: Osteoarthritis, Neuropathy Clustered Wound: No Photos Wound Measurements Length: (cm) 1.1 Width: (cm) 1.4 Depth: (cm) 0.1 Area: (cm) 1.21 Volume: (cm) 0.121 % Reduction in Area: 39.6% % Reduction in Volume: 69.8% Epithelialization: Small (1-33%) Tunneling: No Undermining: No Wound Description Classification: Grade 2 Wound Margin: Well defined, not attached Exudate Amount: Medium Exudate Type: Serosanguineous Exudate Color: red, brown Foul Odor After Cleansing: No Slough/Fibrino Yes Wound  Bed Granulation Amount: Small (1-33%) Exposed Structure Granulation Quality: Pink, Pale Fascia Exposed: No Necrotic Amount: Large (67-100%) Fat Layer (Subcutaneous Tissue) Exposed: Yes Necrotic Quality: Adherent Slough Tendon Exposed: No Muscle Exposed: No Joint Exposed: No Bone Exposed: No Treatment Notes Wound #3 (Foot) Wound Laterality: Right, Lateral Cleanser Soap and Water Discharge Instruction: May shower and wash wound with dial antibacterial soap and water prior to dressing change. Wound Cleanser Discharge Instruction: Cleanse the wound with wound cleanser prior to applying a clean dressing using gauze sponges, not tissue or cotton balls. Peri-Wound Care Topical Primary Dressing Hydrofera Blue Classic Foam, 4x4 in Discharge Instruction: Moisten with saline prior to applying to wound bed Santyl Ointment Discharge Instruction: Apply nickel thick amount to wound bed as instructed Secondary Dressing Woven Gauze Sponge, Non-Sterile 4x4 in Discharge Instruction: Apply over primary dressing as directed. Secured With [employer], 4.5x3.1 (in/yd) Discharge Instruction: Secure with Kerlix as directed. 30M Medipore H Soft Cloth Surgical T 4 x 2 (in/yd) ape Discharge Instruction: Secure dressing with tape as directed. Compression Wrap Compression Stockings Add-Ons Electronic Signature(s) Signed: 10/20/2020 5:50:03 PM By: [name] Gouty RN, BSN Signed: 10/25/2020 2:37:05 PM By: Sandre Kitty Entered By: Sandre Kitty on 10/20/2020 08:07:16 -------------------------------------------------------------------------------- Vitals Details Patient Name: Date of Service: [name] [name]. 10/20/2020 8:00 A M Medical Record Number: [id number] Patient Account Number: [id number] Date of Birth/Sex: Treating RN: [birth year] [name] (65 y.o. [name] [name] Primary Care Braelee Herrle: [name] [name] Other Clinician: Referring Tyhir Schwan: Treating Julianne Chamberlin/Extender: [name] [name] in Treatment: 15 Vital Signs Time Taken: 07:59 Temperature (F): 98.5 Height (in): 74 Pulse (bpm): 75 Source: Stated Respiratory Rate (breaths/min): 18 Weight (lbs): 151 Blood Pressure (mmHg): 121/75 Source: Stated Reference Range: 80 - 120 mg / dl Body Mass Index (BMI): 19.4 Notes pt states that glucose >300 yesterday morning Electronic Signature(s) Signed: 10/20/2020 5:50:03 PM By: [name] Gouty RN, BSN Entered By: [name] [name] on 10/20/2020 08:00:57

## 2020-10-27 ENCOUNTER — Other Ambulatory Visit: Payer: Self-pay

## 2020-10-27 ENCOUNTER — Encounter (HOSPITAL_BASED_OUTPATIENT_CLINIC_OR_DEPARTMENT_OTHER): Payer: HMO | Admitting: Internal Medicine

## 2020-10-27 DIAGNOSIS — L97512 Non-pressure chronic ulcer of other part of right foot with fat layer exposed: Secondary | ICD-10-CM | POA: Diagnosis not present

## 2020-10-27 DIAGNOSIS — E11621 Type 2 diabetes mellitus with foot ulcer: Secondary | ICD-10-CM | POA: Diagnosis not present

## 2020-10-28 ENCOUNTER — Ambulatory Visit (INDEPENDENT_AMBULATORY_CARE_PROVIDER_SITE_OTHER)
Admission: RE | Admit: 2020-10-28 | Discharge: 2020-10-28 | Disposition: A | Discharge status: Home / Self Care | Payer: HMO | Source: Ambulatory Visit | Attending: Surgery | Admitting: Surgery

## 2020-10-28 ENCOUNTER — Other Ambulatory Visit: Payer: Self-pay | Admitting: Surgery

## 2020-10-28 ENCOUNTER — Ambulatory Visit (HOSPITAL_COMMUNITY)
Admission: RE | Admit: 2020-10-28 | Discharge: 2020-10-28 | Disposition: A | Discharge status: Home / Self Care | Payer: HMO | Source: Ambulatory Visit | Attending: Surgery | Admitting: Surgery

## 2020-10-28 DIAGNOSIS — I7025 Atherosclerosis of native arteries of other extremities with ulceration: Secondary | ICD-10-CM | POA: Insufficient documentation

## 2020-10-28 NOTE — Progress Notes (Signed)
Aorta/iliac duplex and right lower extremity arterial duplex completed.  10/28/2020 8:56 AM Kelby Aline., MHA, RVT, RDCS, RDMS

## 2020-10-31 DIAGNOSIS — L89154 Pressure ulcer of sacral region, stage 4: Secondary | ICD-10-CM | POA: Diagnosis not present

## 2020-10-31 DIAGNOSIS — L97529 Non-pressure chronic ulcer of other part of left foot with unspecified severity: Secondary | ICD-10-CM | POA: Diagnosis not present

## 2020-10-31 DIAGNOSIS — L97429 Non-pressure chronic ulcer of left heel and midfoot with unspecified severity: Secondary | ICD-10-CM | POA: Diagnosis not present

## 2020-10-31 DIAGNOSIS — L97519 Non-pressure chronic ulcer of other part of right foot with unspecified severity: Secondary | ICD-10-CM | POA: Diagnosis not present

## 2020-11-01 NOTE — Progress Notes (Signed)
[name], [name] ([id number]) Visit Report for 10/27/2020 Arrival Information Details Patient Name: Date of Service: [name] [name] [facility] THA N L. 10/27/2020 8:00 A M Medical Record Number: [id number] Patient Account Number: [id number] Date of Birth/Sex: Treating RN: [birth year] (65 y.o. [name] Primary Care Cyle Kenyon: [name] Other Clinician: Referring Korver Graybeal: Treating Mikhaila Roh/Extender: [name] in Treatment: 28 Visit Information History Since Last Visit Added or deleted any medications: No Patient Arrived: Wheel Chair Any new allergies or adverse reactions: No Arrival Time: 07:56 Had a fall or experienced change in No Accompanied By: mother activities of daily living that may affect Transfer Assistance: Manual risk of falls: Patient Identification Verified: Yes Signs or symptoms of abuse/neglect since last visito No Secondary Verification Process Completed: Yes Hospitalized since last visit: No Patient Requires Transmission-Based Precautions: No Implantable device outside of the clinic excluding No Patient Has Alerts: Yes cellular tissue based products placed in the center Patient Alerts: Patient on Blood Thinner since last visit: Has Dressing in Place as Prescribed: Yes Pain Present Now: No Electronic Signature(s) Signed: 10/27/2020 5:45:17 PM By: Levan Hurst RN, BSN Entered By: Levan Hurst on 10/27/2020 07:57:20 -------------------------------------------------------------------------------- Encounter Discharge Information Details Patient Name: Date of Service: [name] [name] [facility] THA N L. 10/27/2020 8:00 A M Medical Record Number: [id number] Patient Account Number: [id number] Date of Birth/Sex: Treating RN: 1955-06-07 (66 y.o. [name] Primary Care Talishia Betzler: [name] Other Clinician: Referring Zayne Draheim: Treating Deondre Marinaro/Extender: [name] in Treatment: 16 Encounter Discharge  Information Items Post Procedure Vitals Discharge Condition: Stable Temperature (F): 98.6 Ambulatory Status: Wheelchair Pulse (bpm): 76 Discharge Destination: Home Respiratory Rate (breaths/min): 18 Transportation: Private Auto Blood Pressure (mmHg): 105/70 Accompanied By: mother Schedule Follow-up Appointment: Yes Clinical Summary of Care: Patient Declined Electronic Signature(s) Signed: 10/27/2020 5:45:17 PM By: Levan Hurst RN, BSN Entered By: Levan Hurst on 10/27/2020 08:28:15 -------------------------------------------------------------------------------- Lower Extremity Assessment Details Patient Name: Date of Service: [name] [name] [facility] THA N L. 10/27/2020 8:00 A M Medical Record Number: [id number] Patient Account Number: [id number] Date of Birth/Sex: Treating RN: 1955-02-01 (65 y.o. [name] Primary Care Reannah Totten: [name] Other Clinician: Referring Merrick Feutz: Treating Winda Summerall/Extender: [name] in Treatment: 16 Edema Assessment Assessed: [name]: No] [name]: No] Edema: [Left: Yes] [Right: No] Calf Left: Right: Point of Measurement: 34 cm From Medial Instep 29 cm 29.5 cm Ankle Left: Right: Point of Measurement: 10 cm From Medial Instep 23.2 cm 21.6 cm Vascular Assessment Pulses: Dorsalis Pedis Palpable: [Left:No] Electronic Signature(s) Signed: 10/27/2020 5:45:17 PM By: Levan Hurst RN, BSN Entered By: Levan Hurst on 10/27/2020 07:59:08 -------------------------------------------------------------------------------- Multi Wound Chart Details Patient Name: Date of Service: [name] [name] [facility] THA N L. 10/27/2020 8:00 A M Medical Record Number: [id number] Patient Account Number: [id number] Date of Birth/Sex: Treating RN: 03-Apr-1955 (65 y.o. [name] Primary Care Rocky Gladden: [name] Other Clinician: Referring Raylon Lamson: Treating Ronika Kelson/Extender: [name] in Treatment: 16 Vital  Signs Height(in): 57 Pulse(bpm): 54 Weight(lbs): 151 Blood Pressure(mmHg): 105/70 Body Mass Index(BMI): 19 Temperature(F): 98.6 Respiratory Rate(breaths/min): 32 Photos: [1:Left Calcaneus] [3:Right, Lateral Foot] [N/A:N/A N/A] Wound Location: [1:Pressure Injury] [3:Gradually Appeared] [N/A:N/A] Wounding Event: [1:Pressure Ulcer] [3:Diabetic Wound/Ulcer of the Lower] [N/A:N/A] Primary Etiology: [1:Coronary Artery Disease,] [3:Extremity Coronary Artery Disease,] [N/A:N/A] Comorbid History: [1:Hypertension, Type II Diabetes, Osteoarthritis, Neuropathy 05/23/2020] [3:Hypertension, Type II Diabetes, Osteoarthritis, Neuropathy 06/21/2020] [N/A:N/A] Date Acquired: [1:16] [3:16] [N/A:N/A] Weeks of Treatment: [1:Open] [3:Open] [N/A:N/A] Wound Status: [1:1.8x1.4x0.8] [3:1.1x1.2x0.1] [N/A:N/A] Measurements  L x W x D (cm) [1:1.979] [3:1.037] [N/A:N/A] A (cm) : rea [1:1.583] [3:0.104] [N/A:N/A] Volume (cm) : [1:84.30%] [3:48.20%] [N/A:N/A] % Reduction in A [1:rea: 37.00%] [3:74.10%] [N/A:N/A] % Reduction in Volume: [1:6] Starting Position 1 (o'clock): [1:9] Ending Position 1 (o'clock): [1:0.5] Maximum Distance 1 (cm): [1:Yes] [3:No] [N/A:N/A] Undermining: [1:Category/Stage IV] [3:Grade 2] [N/A:N/A] Classification: [1:Medium] [3:Small] [N/A:N/A] Exudate A mount: [1:Serosanguineous] [3:Serosanguineous] [N/A:N/A] Exudate Type: [1:red, brown] [3:red, brown] [N/A:N/A] Exudate Color: [1:Distinct, outline attached] [3:Well defined, not attached] [N/A:N/A] Wound Margin: [1:Medium (34-66%)] [3:Medium (34-66%)] [N/A:N/A] Granulation A mount: [1:Pink] [3:Pink] [N/A:N/A] Granulation Quality: [1:Medium (34-66%)] [3:Medium (34-66%)] [N/A:N/A] Necrotic A mount: [1:Fat Layer (Subcutaneous Tissue): Yes Fat Layer (Subcutaneous Tissue): Yes N/A] Exposed Structures: [1:Fascia: No Tendon: No Muscle: No Joint: No Bone: No Small (1-33%)] [3:Fascia: No Tendon: No Muscle: No Joint: No Bone: No Small (1-33%)]  [N/A:N/A] Epithelialization: [1:N/A] [3:Debridement - Excisional] [N/A:N/A] Debridement: Pre-procedure Verification/Time Out N/A [3:08:14] [N/A:N/A] Taken: [1:N/A] [3:Subcutaneous, Slough] [N/A:N/A] Tissue Debrided: [1:N/A] [3:Skin/Subcutaneous Tissue] [N/A:N/A] Level: [1:N/A] [3:1.32] [N/A:N/A] Debridement A (sq cm): [1:rea N/A] [3:Curette] [N/A:N/A] Instrument: [1:N/A] [3:Minimum] [N/A:N/A] Bleeding: [1:N/A] [3:Pressure] [N/A:N/A] Hemostasis A chieved: [1:N/A] [3:0] [N/A:N/A] Procedural Pain: [1:N/A] [3:0] [N/A:N/A] Post Procedural Pain: [1:N/A] [3:Procedure was tolerated well] [N/A:N/A] Debridement Treatment Response: [1:N/A] [3:1.1x1.2x0.1] [N/A:N/A] Post Debridement Measurements L x W x D (cm) [1:N/A] [3:0.104] [N/A:N/A] Post Debridement Volume: (cm) [1:N/A] [3:Debridement] [N/A:N/A] Treatment Notes Electronic Signature(s) Signed: 10/27/2020 5:43:20 PM By: [name] RN, BSN Signed: 11/01/2020 4:29:54 PM By: Linton Ham MD Entered By: Linton Ham on 10/27/2020 08:18:47 -------------------------------------------------------------------------------- Multi-Disciplinary Care Plan Details Patient Name: Date of Service: [name] [name] [facility] THA N L. 10/27/2020 8:00 A M Medical Record Number: [id number] Patient Account Number: [id number] Date of Birth/Sex: Treating RN: 28-Jan-1955 (65 y.o. [name] Primary Care Cindy Brindisi: [name] Other Clinician: Referring Crisol Muecke: Treating Hashem Goynes/Extender: [name] in Treatment: 16 Active Inactive Necrotic Tissue Nursing Diagnoses: Impaired tissue integrity related to necrotic/devitalized tissue Knowledge deficit related to management of necrotic/devitalized tissue Goals: Necrotic/devitalized tissue will be minimized in the wound bed Date Initiated: 09/22/2020 Target Resolution Date: 11/04/2020 Goal Status: Active Patient/caregiver will verbalize understanding of reason and process for  debridement of necrotic tissue Date Initiated: 09/22/2020 Date Inactivated: 10/06/2020 Target Resolution Date: 10/28/2020 Goal Status: Met Interventions: Provide education on necrotic tissue and debridement process Treatment Activities: Enzymatic debridement : 09/22/2020 Excisional debridement : 09/22/2020 Notes: Wound/Skin Impairment Nursing Diagnoses: Knowledge deficit related to ulceration/compromised skin integrity Goals: Patient/caregiver will verbalize understanding of skin care regimen Date Initiated: 07/07/2020 Target Resolution Date: 11/25/2020 Goal Status: Active Interventions: Assess patient/caregiver ability to obtain necessary supplies Assess patient/caregiver ability to perform ulcer/skin care regimen upon admission and as needed Provide education on ulcer and skin care Treatment Activities: Skin care regimen initiated : 07/07/2020 Topical wound management initiated : 07/07/2020 Notes: Electronic Signature(s) Signed: 10/27/2020 5:45:17 PM By: Levan Hurst RN, BSN Entered By: Levan Hurst on 10/27/2020 08:03:31 -------------------------------------------------------------------------------- Pain Assessment Details Patient Name: Date of Service: [name] [name] [facility] THA N L. 10/27/2020 8:00 A M Medical Record Number: [id number] Patient Account Number: [id number] Date of Birth/Sex: Treating RN: 1955/04/21 (65 y.o. [name] Primary Care Lev Cervone: [name] Other Clinician: Referring Clarissa Laird: Treating Osborne Serio/Extender: [name] in Treatment: 16 Active Problems Location of Pain Severity and Description of Pain Patient Has Paino No Site Locations Pain Management and Medication Current Pain Management: Electronic Signature(s) Signed: 10/27/2020 5:45:17 PM By: Levan Hurst RN, BSN Entered By: Levan Hurst on 10/27/2020  07:57:58 -------------------------------------------------------------------------------- Patient/Caregiver  Education Details Patient Name: Date of Service: [name] [name] [employer] THA N L. 10/27/2022andnbsp8:00 A M Medical Record Number: [id number] Patient Account Number: [id number] Date of Birth/Gender: Treating RN: [birth year] (65 y.o. [name] Primary Care Physician: [name] Other Clinician: Referring Physician: Treating Physician/Extender: [name] in Treatment: 16 Education Assessment Education Provided To: Patient Education Topics Provided Wound/Skin Impairment: Methods: Explain/Verbal Responses: State content correctly [employer]) Signed: 10/27/2020 5:45:17 PM By: Levan Hurst RN, BSN Entered By: Levan Hurst on 10/27/2020 08:03:44 -------------------------------------------------------------------------------- Wound Assessment Details Patient Name: Date of Service: [name] [name] [facility] THA N L. 10/27/2020 8:00 A M Medical Record Number: [id number] Patient Account Number: [id number] Date of Birth/Sex: Treating RN: 1955-02-13 (65 y.o. [name] Primary Care Elier Zellars: [name] Other Clinician: Referring Doyle Tegethoff: Treating Muadh Creasy/Extender: [name] in Treatment: 16 Wound Status Wound Number: 1 Primary Pressure Ulcer Etiology: Wound Location: Left Calcaneus Wound Open Wounding Event: Pressure Injury Status: Date Acquired: 05/23/2020 Comorbid Coronary Artery Disease, Hypertension, Type II Diabetes, Weeks Of Treatment: 16 History: Osteoarthritis, Neuropathy Clustered Wound: No Photos Wound Measurements Length: (cm) 1.8 Width: (cm) 1.4 Depth: (cm) 0.8 Area: (cm) 1.979 Volume: (cm) 1.583 % Reduction in Area: 84.3% % Reduction in Volume: 37% Epithelialization: Small (1-33%) Tunneling: No Undermining: Yes Starting Position (o'clock): 6 Ending Position (o'clock): 9 Maximum Distance: (cm) 0.5 Wound Description Classification: Category/Stage IV Wound Margin: Distinct, outline  attached Exudate Amount: Medium Exudate Type: Serosanguineous Exudate Color: red, brown Foul Odor After Cleansing: No Slough/Fibrino Yes Wound Bed Granulation Amount: Medium (34-66%) Exposed Structure Granulation Quality: Pink Fascia Exposed: No Necrotic Amount: Medium (34-66%) Fat Layer (Subcutaneous Tissue) Exposed: Yes Necrotic Quality: Adherent Slough Tendon Exposed: No Muscle Exposed: No Joint Exposed: No Bone Exposed: No Treatment Notes Wound #1 (Calcaneus) Wound Laterality: Left Cleanser Soap and Water Discharge Instruction: May shower and wash wound with dial antibacterial soap and water prior to dressing change. Wound Cleanser Discharge Instruction: Cleanse the wound with wound cleanser prior to applying a clean dressing using gauze sponges, not tissue or cotton balls. Peri-Wound Care Topical Gentamicin Discharge Instruction: thin layer to wound bed with dressing changes Primary Dressing KerraCel Ag Gelling Fiber Dressing, 4x5 in (silver alginate) Discharge Instruction: Apply silver alginate lightly pack into depth of wound. Secondary Dressing Woven Gauze Sponge, Non-Sterile 4x4 in Discharge Instruction: Apply over primary dressing as directed. ALLEVYN Heel 4 1/2in x 5 1/2in / 10.5cm x 13.5cm Discharge Instruction: Apply over primary dressing as directed. Secured With [employer], 4.5x3.1 (in/yd) Discharge Instruction: Secure with Kerlix as directed. 41M Medipore H Soft Cloth Surgical T 4 x 2 (in/yd) ape Discharge Instruction: Secure dressing with tape as directed. Compression Wrap Compression Stockings Add-Ons Electronic Signature(s) Signed: 10/27/2020 5:29:08 PM By: Baruch Gouty RN, BSN Signed: 10/27/2020 5:45:17 PM By: Levan Hurst RN, BSN Entered By: Baruch Gouty on 10/27/2020 08:04:17 -------------------------------------------------------------------------------- Wound Assessment Details Patient Name: Date of Service: [name] [name] [facility]  THA N L. 10/27/2020 8:00 A M Medical Record Number: [id number] Patient Account Number: [id number] Date of Birth/Sex: Treating RN: 09/14/1955 (65 y.o. [name] Primary Care Chidera Thivierge: [name] Other Clinician: Referring Myan Suit: Treating Hikeem Andersson/Extender: [name] in Treatment: 16 Wound Status Wound Number: 3 Primary Diabetic Wound/Ulcer of the Lower Extremity Etiology: Wound Location: Right, Lateral Foot Wound Open Wounding Event: Gradually Appeared Status: Date Acquired: 06/21/2020 Comorbid Coronary Artery Disease, Hypertension, Type II Diabetes, Weeks Of Treatment: 16 History: Osteoarthritis, Neuropathy  Clustered Wound: No Photos Wound Measurements Length: (cm) 1.1 Width: (cm) 1.2 Depth: (cm) 0.1 Area: (cm) 1.037 Volume: (cm) 0.104 % Reduction in Area: 48.2% % Reduction in Volume: 74.1% Epithelialization: Small (1-33%) Tunneling: No Undermining: No Wound Description Classification: Grade 2 Wound Margin: Well defined, not attached Exudate Amount: Small Exudate Type: Serosanguineous Exudate Color: red, brown Wound Bed Granulation Amount: Medium (34-66%) Granulation Quality: Pink Necrotic Amount: Medium (34-66%) Necrotic Quality: Adherent Slough Foul Odor After Cleansing: No Slough/Fibrino Yes Exposed Structure Fascia Exposed: No Fat Layer (Subcutaneous Tissue) Exposed: Yes Tendon Exposed: No Muscle Exposed: No Joint Exposed: No Bone Exposed: No Treatment Notes Wound #3 (Foot) Wound Laterality: Right, Lateral Cleanser Soap and Water Discharge Instruction: May shower and wash wound with dial antibacterial soap and water prior to dressing change. Wound Cleanser Discharge Instruction: Cleanse the wound with wound cleanser prior to applying a clean dressing using gauze sponges, not tissue or cotton balls. Peri-Wound Care Topical Primary Dressing Hydrofera Blue Classic Foam, 4x4 in Discharge Instruction: Moisten with  saline prior to applying to wound bed Santyl Ointment Discharge Instruction: Apply nickel thick amount to wound bed as instructed Secondary Dressing Woven Gauze Sponge, Non-Sterile 4x4 in Discharge Instruction: Apply over primary dressing as directed. Secured With [employer], 4.5x3.1 (in/yd) Discharge Instruction: Secure with Kerlix as directed. 51M Medipore H Soft Cloth Surgical T 4 x 2 (in/yd) ape Discharge Instruction: Secure dressing with tape as directed. Compression Wrap Compression Stockings Add-Ons Electronic Signature(s) Signed: 10/27/2020 5:29:08 PM By: Baruch Gouty RN, BSN Signed: 10/27/2020 5:45:17 PM By: Levan Hurst RN, BSN Entered By: Baruch Gouty on 10/27/2020 08:04:58 -------------------------------------------------------------------------------- Vitals Details Patient Name: Date of Service: [name] [name] [facility] THA N L. 10/27/2020 8:00 A M Medical Record Number: [id number] Patient Account Number: [id number] Date of Birth/Sex: Treating RN: [birth year] (65 y.o. [name] Primary Care Catori Panozzo: [name] Other Clinician: Referring Donnavin Vandenbrink: Treating Tracker Mance/Extender: [name] in Treatment: 16 Vital Signs Time Taken: 07:56 Temperature (F): 98.6 Height (in): 74 Pulse (bpm): 76 Weight (lbs): 151 Respiratory Rate (breaths/min): 18 Body Mass Index (BMI): 19.4 Blood Pressure (mmHg): 105/70 Reference Range: 80 - 120 mg / dl Electronic Signature(s) Signed: 10/27/2020 5:45:17 PM By: Levan Hurst RN, BSN Entered By: Levan Hurst on 10/27/2020 07:56:58

## 2020-11-01 NOTE — Progress Notes (Signed)
CORDARO, MUKAI ([id number]) Visit Report for 10/27/2020 Debridement Details Patient Name: Date of Service: [name] [name] [facility] THA N L. 10/27/2020 8:00 A M Medical Record Number: [id number] Patient Account Number: [id number] Date of Birth/Sex: Treating RN: 1955/12/13 (65 y.o. [name], [name] Primary Care Provider: [name] Other Clinician: Referring Provider: Treating Provider/Extender: [name] in Treatment: 16 Debridement Performed for Assessment: Wound #3 Right,Lateral Foot Performed By: Physician Ricard Dillon., MD Debridement Type: Debridement Severity of Tissue Pre Debridement: Fat layer exposed Level of Consciousness (Pre-procedure): Awake and Alert Pre-procedure Verification/Time Out Yes - 08:14 Taken: Start Time: 08:14 T Area Debrided (L x W): otal 1.1 (cm) x 1.2 (cm) = 1.32 (cm) Tissue and other material debrided: Viable, Non-Viable, Slough, Subcutaneous, Slough Level: Skin/Subcutaneous Tissue Debridement Description: Excisional Instrument: Curette Bleeding: Minimum Hemostasis Achieved: Pressure End Time: 08:15 Procedural Pain: 0 Post Procedural Pain: 0 Response to Treatment: Procedure was tolerated well Level of Consciousness (Post- Awake and Alert procedure): Post Debridement Measurements of Total Wound Length: (cm) 1.1 Width: (cm) 1.2 Depth: (cm) 0.1 Volume: (cm) 0.104 Character of Wound/Ulcer Post Debridement: Requires Further Debridement Severity of Tissue Post Debridement: Fat layer exposed Post Procedure Diagnosis Same as Pre-procedure Electronic Signature(s) Signed: 10/27/2020 5:43:20 PM By: [name] RN, BSN Signed: 11/01/2020 4:29:54 PM By: Linton Ham MD Entered By: Linton Ham on 10/27/2020 08:19:03 -------------------------------------------------------------------------------- HPI Details Patient Name: Date of Service: [name] [name] [facility] THA N L. 10/27/2020 8:00 A M Medical Record Number:  [id number] Patient Account Number: [id number] Date of Birth/Sex: Treating RN: July 13, 1955 (65 y.o. [name] Primary Care Provider: [name] Other Clinician: Referring Provider: Treating Provider/Extender: [name] in Treatment: 93 History of Present Illness HPI Description: ADMISSION 07/07/2020 This is a 65 year old man who is a type II diabetic. I have reviewed this in epic. It appeared that his diabetes was diagnosed in 2009 at which time he was on metformin. He was followed for a period of time by an endocrinologist with [facility] to always labeled him as a type II diabetic. Recently in epic there is been a switch to type 1 diabetes. If there were defining antibody test done I do not see them and I think the totality of the evidence would suggest that he is a type II diabetic. His recent problem started in May when he fell down 17 stairs. He suffered a scapular fracture he was hospitalized at [facility] for 9 days and then discharged to [facility]. His mother who is present said that most of these wounds seem to develop surrounding the discharge from hospital or the stay at [facility]. He has an area on his lower sacrum which appears currently to be a stage II but per their description this is actually improved quite a bit. He has an area on the left lateral foot at roughly the base of the fifth metatarsal, the left Achilles heel and the right fifth metatarsal head laterally they have been using topical antibiotics. Seen by primary care yesterday and placed on Keflex The patient had arterial studies on 05/05/2020. I wondered whether this had something to do with wounds on his feet at that time but I have not been able to determine this. At that time his ABI on the right was 1.09 with a TBI of 0.65 on the left 0.88 with a TBI of 0.44. Waveforms were triphasic on the right monophasic on the left. He likely has significant PAD on the left Past medical  history  includes type 2 diabetes poorly controlled last hemoglobin A1c at 12.7 in May, hypertension, hyperlipidemia, atrial fib on Eliquis, coronary artery disease status post CABG, left scapular fracture in May of this year, AAA repair. Bilateral leg weakness ABIs were not done in our clinic as they were just done in May of this year at [facility] 7/14; patient arrives back in clinic he has areas on the left Achilles heel, base of the left fifth metatarsal, right lateral fifth metatarsal. The area he had over the coccyx appears to be healed but this area remains vulnerable. We have been using Iodoflex. I rereviewed his ABIs which were done in May. I do not know exactly what prompted these test however they do show a TBI of 0.44 on the left and monophasic waveforms. 7/21; the patient's wounds are about the same. Certainly no healing and the nurses noted some green drainage on the right fifth metatarsal head, left Achilles a little larger. We have been using silver alginate really without any improvement. We did get an appointment with vascular surgery however its that it did the end of August and they seem to want to repeat the noninvasive test that they have already done in May. I am not sure what the issue here is. We advised the patient's sister to call and point to the idea that they have already had noninvasive studies. The real question I have is does this patient need an angiogram. He is a diabetic and it is possible that the ABIs are falsely elevated because of medial calcification. At least on the left his TBI was abnormal 08/08/2020 upon evaluation today patient appears to be doing about the same in regard to his wounds. He still waiting the appointment with vascular. This is something that hopefully should be undertaken shortly. The 29th of this month is when he is actually scheduled although they have put in a call to move this up if at all possible. Fortunately there does not appear to be  any signs of active infection at this time. No fevers, chills, nausea, vomiting, or diarrhea. 8/18; appointment with Dr. Trula Slade on 8/29. The patient has wounds on the left Achilles heel, left lateral foot and the right lateral fifth metatarsal head. We have been using Santyl under Hydrofera Blue. The surface of especially the left heel looks better to me than what I remember. 9/1; since the patient was last here he underwent angiography by Dr. Trula Slade he also underwent noninvasive studies first showing a ABI of of 0.64 with monophasic waveforms. His TBI was 0.35 with a left great toe pressure of 46 this is about the same pattern as we saw previously. The left was a lot worse than the right. He underwent an angiogram on 08/29/2020. He had multiple 80% peroneal artery stenosis which was his dominant vessel which was treated with angioplasty with no residual stenosis. 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 The anterior tibial artery was also occluded. An attempt was made at recanalization of the anterior tibial artery however this was unsuccessful. Noted that the peroneal artery reconstitutes the posterior tibial artery and the anterior tibial artery creating the plantar arch. The patient has areas on the Achilles left heel left lateral foot and right lateral fifth metatarsal head we have been using Santyl and Hydrofera Blue 9/22; since the patient was last here he went for repeat vascular studies. On the right his ABI was 0.94 with biphasic waveforms TBI of 0.73 with a great toe pressure  of 75. On the left which is the problematic wound his ABI was 1.08 but with biphasic waveforms at the PTA. Biphasic waveforms at the dorsalis pedis his TBI was 0.63 with a great toe pressure of 65. On the left however this does represent a fairly marked improvement. We have been using Santyl and Hydrofera Blue 9/29 left Achilles area. Still is having necrotic surface. Bleeds very easily as the patient is on a  combination of Eliquis aspirin and Plavix just stopped yesterday. I am not going to do any debridement until next week. We are using Santyl and Hydrofera Blue. His arterial status on the right was actually quite good. The area on the fifth metatarsal head needs additional debridement. A total contact cast in this area is not out of the question. He does describe claudication I believe with minimal activity. He is not very active X-ray we ordered last week is still pending in terms of the report 10/6 left Achilles area. In terms of the necrotic surface this is quite a bit better. There is still some debris in the posterior part of the wound however this looks better. Anteriorly the wound appears to be epithelialized there is still a divot in the middle of this but this does not probe to deep tissue. The area on the right fifth metatarsal head laterally really is not making as much progress as I would have liked. Still not completely a viable surface. Both the patient and his wife agree that he is not an external rotator in terms of ambulation [an outie]. We have been using Santyl and Hydrofera Blue for a prolonged period. On the left Achilles which looked like it is worse wound this seems to be making some progress not so much on the right foot. The patient and his wife state that they are rigorous about offloading both of these areas He was treated with angioplasty as I believe on the right peroneal artery that had multiple stenosis. This is a dominant vessel the anterior tibial and posterior tibial arteries are occluded however the foot is fed via collaterals from the peroneal. From my notes I am not able to understand the vascular supply on the left I will have to review this. 10/13; left Achilles top part of this looks healthy granulation in the bottom part of this wound again completely necrotic requiring debridement. The area on the right lateral fifth met head also requires debridement. The  majority of this is epithelialized however he she there is a refractory part to this. 10/20; left Achilles 100% better looking wound surface. Necrotic upper 50% seems to have resolved at least by inspection under illumination. Culture I did of this area last week postdebridement showed Proteus and Citrobacter. Both sensitive to quinolones and I put him on ciprofloxacin. We have been using silver alginate on the wound on the heel and Santyl and Hydrofera Blue on the right fifth met head. 10/27; left Achilles is measuring much smaller we are using topical gentamicin under silver alginate. Right fifth met head still fibrinous debris on the surface we are using Santyl and Hydrofera Blue. Patient lost his brother suddenly last week he was unable to keep his vascular surgery appointment. Electronic Signature(s) Signed: 11/01/2020 4:29:54 PM By: Linton Ham MD Entered By: Linton Ham on 10/27/2020 08:19:48 -------------------------------------------------------------------------------- Physical Exam Details Patient Name: Date of Service: [name] [name] [facility] THA N L. 10/27/2020 8:00 A M Medical Record Number: [id number] Patient Account Number: [id number] Date of Birth/Sex: Treating RN: Jun 13, 1955 (65  y.o. [name] Primary Care Provider: [name] Other Clinician: Referring Provider: Treating Provider/Extender: [name] in Treatment: 16 Constitutional Sitting or standing Blood Pressure is within target range for patient.. Pulse regular and within target range for patient.Marland Kitchen Respirations regular, non-labored and within target range.. Temperature is normal and within the target range for the patient.Marland Kitchen Appears in no distress. Notes Wound exam; the area on the left Achilles looks a lot better/although there is still a slight divot from about 6-9 o'clock. No need for debridement tissue looks reasonably healthy. The area on the right fifth metatarsal head was an  area that I did not think would be so difficult to heal he has a nice rim of epithelialization but it is only come down about 2 mm today. No evidence of infection in either wound area Electronic Signature(s) Signed: 11/01/2020 4:29:54 PM By: Linton Ham MD Entered By: Linton Ham on 10/27/2020 28:00:34 -------------------------------------------------------------------------------- Physician Orders Details Patient Name: Date of Service: [name] [name] [facility] THA N L. 10/27/2020 8:00 A M Medical Record Number: [id number] Patient Account Number: [id number] Date of Birth/Sex: Treating RN: [birth year] (65 y.o. [name] Primary Care Provider: [name] Other Clinician: Referring Provider: Treating Provider/Extender: [name] in Treatment: 44 Verbal / Phone Orders: No Diagnosis Coding ICD-10 Coding Code Description E11.621 Type 2 diabetes mellitus with foot ulcer E11.51 Type 2 diabetes mellitus with diabetic peripheral angiopathy without gangrene L97.528 Non-pressure chronic ulcer of other part of left foot with other specified severity L97.511 Non-pressure chronic ulcer of other part of right foot limited to breakdown of skin E11.42 Type 2 diabetes mellitus with diabetic polyneuropathy Follow-up Appointments ppointment in 1 week. - Dr. Dellia Nims Return A Bathing/ Shower/ Hygiene May shower and wash wound with soap and water. - with dressing changes only. Edema Control - Lymphedema / SCD / Other Elevate legs to the level of the heart or above for 30 minutes daily and/or when sitting, a frequency of: - 3-4 times a day throughout the day. Off-Loading Turn and reposition every 2 hours Other: - float heels while resting in bed or chair. Use a pillow or rolled sheet under legs to offload the heels. Home Health No change in wound care orders this week; continue Home Health for wound care. May utilize formulary equivalent dressing for wound treatment orders  unless otherwise specified. Other Home Health Orders/Instructions: - [facility] home health three times a week and family to change all other days. Wound Treatment Wound #1 - Calcaneus Wound Laterality: Left Cleanser: Soap and Water [facility]) Every Other Day/30 Days Discharge Instructions: May shower and wash wound with dial antibacterial soap and water prior to dressing change. Cleanser: Wound Cleanser [facility]) Every Other Day/30 Days Discharge Instructions: Cleanse the wound with wound cleanser prior to applying a clean dressing using gauze sponges, not tissue or cotton balls. Topical: Gentamicin Every Other Day/30 Days Discharge Instructions: thin layer to wound bed with dressing changes Prim Dressing: KerraCel Ag Gelling Fiber Dressing, 4x5 in (silver alginate) (Home Health) Every Other Day/30 Days ary Discharge Instructions: Apply silver alginate lightly pack into depth of wound. Secondary Dressing: Woven Gauze Sponge, Non-Sterile 4x4 in [facility]) Every Other Day/30 Days Discharge Instructions: Apply over primary dressing as directed. Secondary Dressing: ALLEVYN Heel 4 1/2in x 5 1/2in / 10.5cm x 13.5cm [name]) Every Other Day/30 Days Discharge Instructions: Apply over primary dressing as directed. Secured With: [employer], 4.5x3.1 (in/yd) [facility]) Every Other Day/30  Days Discharge Instructions: Secure with Kerlix as directed. Secured With: 1057M Medipore H Soft Cloth Surgical T 4 x 2 (in/yd) (Home Health) Every Other Day/30 Days ape Discharge Instructions: Secure dressing with tape as directed. Wound #3 - Foot Wound Laterality: Right, Lateral Cleanser: Soap and Water (Home Health) 1 x Per Day/30 Days Discharge Instructions: May shower and wash wound with dial antibacterial soap and water prior to dressing change. Cleanser: Wound Cleanser (Home Health) 1 x Per Day/30 Days Discharge Instructions: Cleanse the wound with wound cleanser prior to applying a clean  dressing using gauze sponges, not tissue or cotton balls. Prim Dressing: Hydrofera Blue Classic Foam, 4x4 in (Home Health) 1 x Per Day/30 Days ary Discharge Instructions: Moisten with saline prior to applying to wound bed Prim Dressing: Santyl Ointment ([place]) 1 x Per Day/30 Days ary Discharge Instructions: Apply nickel thick amount to wound bed as instructed Secondary Dressing: Woven Gauze Sponge, Non-Sterile 4x4 in (Home Health) 1 x Per Day/30 Days Discharge Instructions: Apply over primary dressing as directed. Secured With: [employer], 4.5x3.1 (in/yd) (Home Health) 1 x Per Day/30 Days Discharge Instructions: Secure with Kerlix as directed. Secured With: 1057M Medipore H Soft Cloth Surgical T 4 x 2 (in/yd) (Home Health) 1 x Per Day/30 Days ape Discharge Instructions: Secure dressing with tape as directed. Electronic Signature(s) Signed: 10/27/2020 5:45:17 PM By: Levan Hurst RN, BSN Signed: 11/01/2020 4:29:54 PM By: Linton Ham MD Entered By: Levan Hurst on 10/27/2020 08:15:51 -------------------------------------------------------------------------------- Problem List Details Patient Name: Date of Service: [name] [name] [facility] THA N L. 10/27/2020 8:00 A M Medical Record Number: [id number] Patient Account Number: [id number] Date of Birth/Sex: Treating RN: 07-21-1955 (65 y.o. [name] Primary Care Provider: [name] Other Clinician: Referring Provider: Treating Provider/Extender: [name] in Treatment: 16 Active Problems ICD-10 Encounter Code Description Active Date MDM Diagnosis E11.621 Type 2 diabetes mellitus with foot ulcer 07/07/2020 No Yes E11.51 Type 2 diabetes mellitus with diabetic peripheral angiopathy without gangrene 09/01/2020 No Yes L97.528 Non-pressure chronic ulcer of other part of left foot with other specified 07/07/2020 No Yes severity L97.511 Non-pressure chronic ulcer of other part of right foot limited to  breakdown of 07/07/2020 No Yes skin E11.42 Type 2 diabetes mellitus with diabetic polyneuropathy 07/07/2020 No Yes Inactive Problems ICD-10 Code Description Active Date Inactive Date L89.152 Pressure ulcer of sacral region, stage 2 07/07/2020 07/07/2020 M62.863 Pressure ulcer of left heel, stage 2 07/07/2020 07/07/2020 Resolved Problems Electronic Signature(s) Signed: 11/01/2020 4:29:54 PM By: Linton Ham MD Entered By: Linton Ham on 10/27/2020 08:18:37 -------------------------------------------------------------------------------- Progress Note Details Patient Name: Date of Service: [name] [name] [facility] THA N L. 10/27/2020 8:00 A M Medical Record Number: [id number] Patient Account Number: [id number] Date of Birth/Sex: Treating RN: 1955/05/04 (65 y.o. [name] Primary Care Provider: [name] Other Clinician: Referring Provider: Treating Provider/Extender: [name] in Treatment: 16 Subjective History of Present Illness (HPI) ADMISSION 07/07/2020 This is a 65 year old man who is a type II diabetic. I have reviewed this in epic. It appeared that his diabetes was diagnosed in 2009 at which time he was on metformin. He was followed for a period of time by an endocrinologist with [facility] to always labeled him as a type II diabetic. Recently in epic there is been a switch to type 1 diabetes. If there were defining antibody test done I do not see them and I think the totality of the evidence would suggest that he is  a type II diabetic. His recent problem started in May when he fell down 17 stairs. He suffered a scapular fracture he was hospitalized at [facility] for 9 days and then discharged to [facility]. His mother who is present said that most of these wounds seem to develop surrounding the discharge from hospital or the stay at [facility]. He has an area on his lower sacrum which appears currently to be a stage II but per their description this is actually  improved quite a bit. He has an area on the left lateral foot at roughly the base of the fifth metatarsal, the left Achilles heel and the right fifth metatarsal head laterally they have been using topical antibiotics. Seen by primary care yesterday and placed on Keflex The patient had arterial studies on 05/05/2020. I wondered whether this had something to do with wounds on his feet at that time but I have not been able to determine this. At that time his ABI on the right was 1.09 with a TBI of 0.65 on the left 0.88 with a TBI of 0.44. Waveforms were triphasic on the right monophasic on the left. He likely has significant PAD on the left Past medical history includes type 2 diabetes poorly controlled last hemoglobin A1c at 12.7 in May, hypertension, hyperlipidemia, atrial fib on Eliquis, coronary artery disease status post CABG, left scapular fracture in May of this year, AAA repair. Bilateral leg weakness ABIs were not done in our clinic as they were just done in May of this year at [facility] 7/14; patient arrives back in clinic he has areas on the left Achilles heel, base of the left fifth metatarsal, right lateral fifth metatarsal. The area he had over the coccyx appears to be healed but this area remains vulnerable. We have been using Iodoflex. I rereviewed his ABIs which were done in May. I do not know exactly what prompted these test however they do show a TBI of 0.44 on the left and monophasic waveforms. 7/21; the patient's wounds are about the same. Certainly no healing and the nurses noted some green drainage on the right fifth metatarsal head, left Achilles a little larger. We have been using silver alginate really without any improvement. We did get an appointment with vascular surgery however its that it did the end of August and they seem to want to repeat the noninvasive test that they have already done in May. I am not sure what the issue here is. We advised the patient's sister  to call and point to the idea that they have already had noninvasive studies. The real question I have is does this patient need an angiogram. He is a diabetic and it is possible that the ABIs are falsely elevated because of medial calcification. At least on the left his TBI was abnormal 08/08/2020 upon evaluation today patient appears to be doing about the same in regard to his wounds. He still waiting the appointment with vascular. This is something that hopefully should be undertaken shortly. The 29th of this month is when he is actually scheduled although they have put in a call to move this up if at all possible. Fortunately there does not appear to be any signs of active infection at this time. No fevers, chills, nausea, vomiting, or diarrhea. 8/18; appointment with Dr. Trula Slade on 8/29. The patient has wounds on the left Achilles heel, left lateral foot and the right lateral fifth metatarsal head. We have been using Santyl under Hydrofera Blue.  The surface of especially the left heel looks better to me than what I remember. 9/1; since the patient was last here he underwent angiography by Dr. Trula Slade he also underwent noninvasive studies first showing a ABI of of 0.64 with monophasic waveforms. His TBI was 0.35 with a left great toe pressure of 46 this is about the same pattern as we saw previously. The left was a lot worse than the right. He underwent an angiogram on 08/29/2020. He had multiple 80% peroneal artery stenosis which was his dominant vessel which was treated with angioplasty with no residual stenosis. 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 The anterior tibial artery was also occluded. An attempt was made at recanalization of the anterior tibial artery however this was unsuccessful. Noted that the peroneal artery reconstitutes the posterior tibial artery and the anterior tibial artery creating the plantar arch. The patient has areas on the Achilles left heel left lateral foot and  right lateral fifth metatarsal head we have been using Santyl and Hydrofera Blue 9/22; since the patient was last here he went for repeat vascular studies. On the right his ABI was 0.94 with biphasic waveforms TBI of 0.73 with a great toe pressure of 75. On the left which is the problematic wound his ABI was 1.08 but with biphasic waveforms at the PTA. Biphasic waveforms at the dorsalis pedis his TBI was 0.63 with a great toe pressure of 65. On the left however this does represent a fairly marked improvement. We have been using Santyl and Hydrofera Blue 9/29 left Achilles area. Still is having necrotic surface. Bleeds very easily as the patient is on a combination of Eliquis aspirin and Plavix just stopped yesterday. I am not going to do any debridement until next week. We are using Santyl and Hydrofera Blue. His arterial status on the right was actually quite good. The area on the fifth metatarsal head needs additional debridement. A total contact cast in this area is not out of the question. He does describe claudication I believe with minimal activity. He is not very active X-ray we ordered last week is still pending in terms of the report 10/6 left Achilles area. In terms of the necrotic surface this is quite a bit better. There is still some debris in the posterior part of the wound however this looks better. Anteriorly the wound appears to be epithelialized there is still a divot in the middle of this but this does not probe to deep tissue. The area on the right fifth metatarsal head laterally really is not making as much progress as I would have liked. Still not completely a viable surface. Both the patient and his wife agree that he is not an external rotator in terms of ambulation [an outie]. We have been using Santyl and Hydrofera Blue for a prolonged period. On the left Achilles which looked like it is worse wound this seems to be making some progress not so much on the right foot. The  patient and his wife state that they are rigorous about offloading both of these areas He was treated with angioplasty as I believe on the right peroneal artery that had multiple stenosis. This is a dominant vessel the anterior tibial and posterior tibial arteries are occluded however the foot is fed via collaterals from the peroneal. From my notes I am not able to understand the vascular supply on the left I will have to review this. 10/13; left Achilles top part of this looks healthy granulation in  the bottom part of this wound again completely necrotic requiring debridement. oo The area on the right lateral fifth met head also requires debridement. The majority of this is epithelialized however he she there is a refractory part to this. 10/20; left Achilles 100% better looking wound surface. Necrotic upper 50% seems to have resolved at least by inspection under illumination. Culture I did of this area last week postdebridement showed Proteus and Citrobacter. Both sensitive to quinolones and I put him on ciprofloxacin. We have been using silver alginate on the wound on the heel and Santyl and Hydrofera Blue on the right fifth met head. 10/27; left Achilles is measuring much smaller we are using topical gentamicin under silver alginate. Right fifth met head still fibrinous debris on the surface we are using Santyl and Hydrofera Blue. Patient lost his brother suddenly last week he was unable to keep his vascular surgery appointment. Objective Constitutional Sitting or standing Blood Pressure is within target range for patient.. Pulse regular and within target range for patient.Marland Kitchen Respirations regular, non-labored and within target range.. Temperature is normal and within the target range for the patient.Marland Kitchen Appears in no distress. Vitals Time Taken: 7:56 AM, Height: 74 in, Weight: 151 lbs, BMI: 19.4, Temperature: 98.6 F, Pulse: 76 bpm, Respiratory Rate: 18 breaths/min, Blood Pressure: 105/70  mmHg. General Notes: Wound exam; the area on the left Achilles looks a lot better/although there is still a slight divot from about 6-9 o'clock. No need for debridement tissue looks reasonably healthy. oo The area on the right fifth metatarsal head was an area that I did not think would be so difficult to heal he has a nice rim of epithelialization but it is only come down about 2 mm today. oo No evidence of infection in either wound area Integumentary (Hair, Skin) Wound #1 status is Open. Original cause of wound was Pressure Injury. The date acquired was: 05/23/2020. The wound has been in treatment 16 weeks. The wound is located on the Left Calcaneus. The wound measures 1.8cm length x 1.4cm width x 0.8cm depth; 1.979cm^2 area and 1.583cm^3 volume. There is Fat Layer (Subcutaneous Tissue) exposed. There is no tunneling noted, however, there is undermining starting at 6:00 and ending at 9:00 with a maximum distance of 0.5cm. There is a medium amount of serosanguineous drainage noted. The wound margin is distinct with the outline attached to the wound base. There is medium (34-66%) pink granulation within the wound bed. There is a medium (34-66%) amount of necrotic tissue within the wound bed including Adherent Slough. Wound #3 status is Open. Original cause of wound was Gradually Appeared. The date acquired was: 06/21/2020. The wound has been in treatment 16 weeks. The wound is located on the Right,Lateral Foot. The wound measures 1.1cm length x 1.2cm width x 0.1cm depth; 1.037cm^2 area and 0.104cm^3 volume. There is Fat Layer (Subcutaneous Tissue) exposed. There is no tunneling or undermining noted. There is a small amount of serosanguineous drainage noted. The wound margin is well defined and not attached to the wound base. There is medium (34-66%) pink granulation within the wound bed. There is a medium (34-66%) amount of necrotic tissue within the wound bed including Adherent  Slough. 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 There was a Excisional Skin/Subcutaneous Tissue Debridement with a total area of 1.32 sq cm performed by Ricard Dillon., MD. With the following instrument(s): Curette to remove Viable and Non-Viable tissue/material. Material removed includes Subcutaneous Tissue and Slough and. No specimens were taken. A time out was conducted at 08:14, prior to the start of the procedure. A Minimum amount of bleeding was controlled with Pressure. The procedure was tolerated well with a pain level of 0 throughout and a pain level of 0 following the procedure. Post Debridement Measurements: 1.1cm length x 1.2cm width x 0.1cm depth; 0.104cm^3 volume. Character of Wound/Ulcer Post Debridement requires further debridement. Severity of Tissue Post Debridement is: Fat layer exposed. Post procedure Diagnosis Wound #3: Same as Pre-Procedure Plan Follow-up Appointments: Return Appointment in 1 week. - Dr. Dellia Nims Bathing/ Shower/ Hygiene: May shower and wash wound with soap and water. - with dressing changes only. Edema Control - Lymphedema / SCD / Other: Elevate legs to the level of the heart or above for 30 minutes daily and/or when sitting, a frequency of: - 3-4 times a day throughout the day. Off-Loading: Turn and reposition every 2 hours Other: - float heels while resting in bed or chair. Use a pillow or rolled sheet under legs to  offload the heels. Home Health: No change in wound care orders this week; continue Home Health for wound care. May utilize formulary equivalent dressing for wound treatment orders unless otherwise specified. Other Home Health Orders/Instructions: - [facility] home health three times a week and family to change all other days. WOUND #1: - Calcaneus Wound Laterality: Left Cleanser: Soap and Water [facility]) Every Other Day/30 Days Discharge Instructions: May shower and wash wound with dial antibacterial soap and water prior to dressing change. Cleanser: Wound Cleanser [facility]) Every Other Day/30 Days Discharge Instructions: Cleanse the wound with wound cleanser prior to applying a clean dressing using gauze sponges, not tissue or cotton balls. Topical: Gentamicin Every Other Day/30 Days Discharge Instructions: thin layer to wound bed with dressing changes Prim Dressing: KerraCel Ag Gelling Fiber Dressing, 4x5 in (silver alginate) (Home Health) Every Other Day/30 Days ary Discharge Instructions: Apply silver alginate lightly pack into depth of wound. Secondary Dressing: Woven Gauze Sponge, Non-Sterile 4x4 in [facility]) Every Other Day/30 Days Discharge Instructions: Apply over primary dressing as directed. Secondary Dressing: ALLEVYN Heel 4 1/2in x 5 1/2in / 10.5cm x 13.5cm [facility]) Every Other Day/30 Days Discharge Instructions: Apply over primary dressing as directed. Secured With: [employer], 4.5x3.1 (in/yd) [facility]) Every Other Day/30 Days Discharge Instructions: Secure with Kerlix as directed. Secured With: 31M Medipore H Soft Cloth Surgical T 4 x 2 (in/yd) (Home Health) Every Other Day/30 Days ape Discharge Instructions: Secure dressing with tape as directed. WOUND #3: - Foot Wound Laterality: Right, Lateral Cleanser: Soap and Water (Home Health) 1 x Per Day/30 Days Discharge Instructions: May shower and wash wound with dial antibacterial soap and water prior  to dressing change. Cleanser: Wound Cleanser (Home Health) 1 x Per Day/30 Days Discharge Instructions: Cleanse the wound with wound cleanser prior to applying a clean dressing using gauze sponges, not tissue or cotton balls. Prim Dressing: Hydrofera Blue Classic Foam, 4x4 in (Home Health) 1 x Per Day/30 Days ary Discharge Instructions: Moisten with saline prior to applying  to wound bed Prim Dressing: Santyl Ointment ([place]) 1 x Per Day/30 Days ary Discharge Instructions: Apply nickel thick amount to wound bed as instructed Secondary Dressing: Woven Gauze Sponge, Non-Sterile 4x4 in (Home Health) 1 x Per Day/30 Days Discharge Instructions: Apply over primary dressing as directed. Secured With: [employer], 4.5x3.1 (in/yd) (Home Health) 1 x Per Day/30 Days Discharge Instructions: Secure with Kerlix as directed. Secured With: 61M Medipore H Soft Cloth Surgical T 4 x 2 (in/yd) (Home Health) 1 x Per Day/30 Days ape Discharge Instructions: Secure dressing with tape as directed. 1. I have not changed the dressing in either area on the left heel a slight layer of topical gentamicin with silver alginate and on the right fifth metatarsal head still Santyl and Hydrofera Blue 2. I am a bit this opinion appointed about the continuous need for debridement on the right fifth metatarsal head and the very slow progress. We could consider [employer] next week. I am also not certain about the status of the blood flow on the left although we had an intervention on the right peroneal artery I believe. 3. For 1 reason or another his vascular surgery appointments appear to be getting delayed I think most recently because of the patient's family loss Electronic Signature(s) Signed: 11/01/2020 4:29:54 PM By: Linton Ham MD Entered By: Linton Ham on 10/27/2020 08:23:51 -------------------------------------------------------------------------------- SuperBill Details Patient Name: Date of  Service: [name] [name] HNA THA N L. 10/27/2020 Medical Record Number: [id number] Patient Account Number: [id number] Date of Birth/Sex: Treating RN: [birth year] (65 y.o. [name], [name] Primary Care Provider: [name] Other Clinician: Referring Provider: Treating Provider/Extender: [name] in Treatment: 16 Diagnosis Coding ICD-10 Codes Code Description [id number] Type 2 diabetes mellitus with foot ulcer E11.51 Type 2 diabetes mellitus with diabetic peripheral angiopathy without gangrene L97.528 Non-pressure chronic ulcer of other part of left foot with other specified severity L97.511 Non-pressure chronic ulcer of other part of right foot limited to breakdown of skin E11.42 Type 2 diabetes mellitus with diabetic polyneuropathy Facility Procedures CPT4 Code: [id number] Description: 39030 - DEB SUBQ TISSUE 20 SQ CM/< ICD-10 Diagnosis Description L97.511 Non-pressure chronic ulcer of other part of right foot limited to breakdown of Modifier: skin Quantity: 1 Physician Procedures : CPT4 Code Description Modifier [id number] - WC PHYS SUBQ TISS 20 SQ CM ICD-10 Diagnosis Description L97.511 Non-pressure chronic ulcer of other part of right foot limited to breakdown of skin Quantity: 1 Electronic Signature(s) Signed: 11/01/2020 4:29:54 PM By: Linton Ham MD Entered By: Linton Ham on 10/27/2020 08:24:08

## 2020-11-02 DIAGNOSIS — N138 Other obstructive and reflux uropathy: Secondary | ICD-10-CM | POA: Diagnosis not present

## 2020-11-02 DIAGNOSIS — I10 Essential (primary) hypertension: Secondary | ICD-10-CM | POA: Diagnosis not present

## 2020-11-02 DIAGNOSIS — N401 Enlarged prostate with lower urinary tract symptoms: Secondary | ICD-10-CM | POA: Diagnosis not present

## 2020-11-02 DIAGNOSIS — E1142 Type 2 diabetes mellitus with diabetic polyneuropathy: Secondary | ICD-10-CM | POA: Diagnosis not present

## 2020-11-02 DIAGNOSIS — Z7901 Long term (current) use of anticoagulants: Secondary | ICD-10-CM | POA: Diagnosis not present

## 2020-11-02 DIAGNOSIS — G8929 Other chronic pain: Secondary | ICD-10-CM | POA: Diagnosis not present

## 2020-11-02 DIAGNOSIS — E1151 Type 2 diabetes mellitus with diabetic peripheral angiopathy without gangrene: Secondary | ICD-10-CM | POA: Diagnosis not present

## 2020-11-02 DIAGNOSIS — S42102D Fracture of unspecified part of scapula, left shoulder, subsequent encounter for fracture with routine healing: Secondary | ICD-10-CM | POA: Diagnosis not present

## 2020-11-02 DIAGNOSIS — M1990 Unspecified osteoarthritis, unspecified site: Secondary | ICD-10-CM | POA: Diagnosis not present

## 2020-11-02 DIAGNOSIS — Z794 Long term (current) use of insulin: Secondary | ICD-10-CM | POA: Diagnosis not present

## 2020-11-02 DIAGNOSIS — Z7982 Long term (current) use of aspirin: Secondary | ICD-10-CM | POA: Diagnosis not present

## 2020-11-02 DIAGNOSIS — D649 Anemia, unspecified: Secondary | ICD-10-CM | POA: Diagnosis not present

## 2020-11-02 DIAGNOSIS — E11621 Type 2 diabetes mellitus with foot ulcer: Secondary | ICD-10-CM | POA: Diagnosis not present

## 2020-11-02 DIAGNOSIS — M47812 Spondylosis without myelopathy or radiculopathy, cervical region: Secondary | ICD-10-CM | POA: Diagnosis not present

## 2020-11-02 DIAGNOSIS — L97511 Non-pressure chronic ulcer of other part of right foot limited to breakdown of skin: Secondary | ICD-10-CM | POA: Diagnosis not present

## 2020-11-02 DIAGNOSIS — K5904 Chronic idiopathic constipation: Secondary | ICD-10-CM | POA: Diagnosis not present

## 2020-11-02 DIAGNOSIS — E785 Hyperlipidemia, unspecified: Secondary | ICD-10-CM | POA: Diagnosis not present

## 2020-11-02 DIAGNOSIS — I480 Paroxysmal atrial fibrillation: Secondary | ICD-10-CM | POA: Diagnosis not present

## 2020-11-02 DIAGNOSIS — L97423 Non-pressure chronic ulcer of left heel and midfoot with necrosis of muscle: Secondary | ICD-10-CM | POA: Diagnosis not present

## 2020-11-02 DIAGNOSIS — F3341 Major depressive disorder, recurrent, in partial remission: Secondary | ICD-10-CM | POA: Diagnosis not present

## 2020-11-02 DIAGNOSIS — I2510 Atherosclerotic heart disease of native coronary artery without angina pectoris: Secondary | ICD-10-CM | POA: Diagnosis not present

## 2020-11-02 DIAGNOSIS — M720 Palmar fascial fibromatosis [Dupuytren]: Secondary | ICD-10-CM | POA: Diagnosis not present

## 2020-11-02 DIAGNOSIS — F419 Anxiety disorder, unspecified: Secondary | ICD-10-CM | POA: Diagnosis not present

## 2020-11-02 DIAGNOSIS — L409 Psoriasis, unspecified: Secondary | ICD-10-CM | POA: Diagnosis not present

## 2020-11-03 ENCOUNTER — Encounter (HOSPITAL_BASED_OUTPATIENT_CLINIC_OR_DEPARTMENT_OTHER): Payer: HMO | Attending: Internal Medicine | Admitting: Internal Medicine

## 2020-11-03 ENCOUNTER — Other Ambulatory Visit: Payer: Self-pay

## 2020-11-03 DIAGNOSIS — E11621 Type 2 diabetes mellitus with foot ulcer: Secondary | ICD-10-CM | POA: Diagnosis not present

## 2020-11-03 DIAGNOSIS — L89624 Pressure ulcer of left heel, stage 4: Secondary | ICD-10-CM | POA: Diagnosis not present

## 2020-11-03 DIAGNOSIS — L97528 Non-pressure chronic ulcer of other part of left foot with other specified severity: Secondary | ICD-10-CM | POA: Diagnosis not present

## 2020-11-03 DIAGNOSIS — L97511 Non-pressure chronic ulcer of other part of right foot limited to breakdown of skin: Secondary | ICD-10-CM | POA: Insufficient documentation

## 2020-11-03 DIAGNOSIS — E1151 Type 2 diabetes mellitus with diabetic peripheral angiopathy without gangrene: Secondary | ICD-10-CM | POA: Diagnosis not present

## 2020-11-03 DIAGNOSIS — E1142 Type 2 diabetes mellitus with diabetic polyneuropathy: Secondary | ICD-10-CM | POA: Diagnosis not present

## 2020-11-03 DIAGNOSIS — L97512 Non-pressure chronic ulcer of other part of right foot with fat layer exposed: Secondary | ICD-10-CM | POA: Diagnosis not present

## 2020-11-03 NOTE — Progress Notes (Signed)
CHAZE, HRUSKA ([id number]) Visit Report for 11/03/2020 Debridement Details Patient Name: Date of Service: [name] THA N L. 11/03/2020 8:00 A M Medical Record Number: [id number] Patient Account Number: [id number] Date of Birth/Sex: Treating RN: 1955-12-27 (65 y.o. [name] Primary Care Provider: [name] Other Clinician: Referring Provider: Treating Provider/Extender: [name] in Treatment: 17 Debridement Performed for Assessment: Wound #1 Left Calcaneus Performed By: Physician Ricard Dillon., MD Debridement Type: Debridement Level of Consciousness (Pre-procedure): Awake and Alert Pre-procedure Verification/Time Out Yes - 08:45 Taken: Start Time: 08:45 Pain Control: Other : benzocaine 20% sprAY T Area Debrided (L x W): otal 1.9 (cm) x 1.3 (cm) = 2.47 (cm) Tissue and other material debrided: Viable, Non-Viable, Slough, Subcutaneous, Slough Level: Skin/Subcutaneous Tissue Debridement Description: Excisional Instrument: Curette Bleeding: Minimum Hemostasis Achieved: Silver Nitrate Procedural Pain: 0 Post Procedural Pain: 0 Response to Treatment: Procedure was tolerated well Level of Consciousness (Post- Awake and Alert procedure): Post Debridement Measurements of Total Wound Length: (cm) 1.9 Stage: Category/Stage IV Width: (cm) 1.3 Depth: (cm) 0.6 Volume: (cm) 1.164 Character of Wound/Ulcer Post Debridement: Improved Post Procedure Diagnosis Same as Pre-procedure Electronic Signature(s) Signed: 11/03/2020 5:02:06 PM By: Linton Ham MD Signed: 11/03/2020 6:11:26 PM By: [name] RN, BSN Entered By: Linton Ham on 11/03/2020 09:26:34 -------------------------------------------------------------------------------- Debridement Details Patient Name: Date of Service: [name] THA N L. 11/03/2020 8:00 A M Medical Record Number: [id number] Patient Account Number: [id number] Date of Birth/Sex: Treating  RN: 1955/03/30 (65 y.o. [name], [name] Primary Care Provider: [name] Other Clinician: Referring Provider: Treating Provider/Extender: [name] in Treatment: 17 Debridement Performed for Assessment: Wound #3 Right,Lateral Foot Performed By: Physician Ricard Dillon., MD Debridement Type: Debridement Severity of Tissue Pre Debridement: Fat layer exposed Level of Consciousness (Pre-procedure): Awake and Alert Pre-procedure Verification/Time Out Yes - 08:45 Taken: Start Time: 08:45 Pain Control: Other : benzocaine 20% sprAY T Area Debrided (L x W): otal 1.1 (cm) x 1.2 (cm) = 1.32 (cm) Tissue and other material debrided: Viable, Non-Viable, Slough, Subcutaneous, Slough Level: Skin/Subcutaneous Tissue Debridement Description: Excisional Instrument: Curette Bleeding: Minimum Hemostasis Achieved: Silver Nitrate Procedural Pain: 0 Post Procedural Pain: 0 Response to Treatment: Procedure was tolerated well Level of Consciousness (Post- Awake and Alert procedure): Post Debridement Measurements of Total Wound Length: (cm) 1.1 Width: (cm) 1.2 Depth: (cm) 0.1 Volume: (cm) 0.104 Character of Wound/Ulcer Post Debridement: Improved Severity of Tissue Post Debridement: Fat layer exposed Post Procedure Diagnosis Same as Pre-procedure Electronic Signature(s) Signed: 11/03/2020 5:02:06 PM By: Linton Ham MD Signed: 11/03/2020 6:11:26 PM By: [name] RN, BSN Entered By: Linton Ham on 11/03/2020 09:26:49 -------------------------------------------------------------------------------- HPI Details Patient Name: Date of Service: [name] THA N L. 11/03/2020 8:00 A M Medical Record Number: [id number] Patient Account Number: [id number] Date of Birth/Sex: Treating RN: 1955-07-09 (65 y.o. [name] Primary Care Provider: [name] Other Clinician: Referring Provider: Treating Provider/Extender: [name] in Treatment: 65 History of Present Illness HPI Description: ADMISSION 07/07/2020 This is a 65 year old man who is a type II diabetic. I have reviewed this in epic. It appeared that his diabetes was diagnosed in 2009 at which time he was on metformin. He was followed for a period of time by an endocrinologist with [facility] to always labeled him as a type II diabetic. Recently in epic there is been a switch to type 1 diabetes. If there were defining antibody test done I  do not see them and I think the totality of the evidence would suggest that he is a type II diabetic. His recent problem started in May when he fell down 17 stairs. He suffered a scapular fracture he was hospitalized at [facility] for 9 days and then discharged to [facility]. His mother who is present said that most of these wounds seem to develop surrounding the discharge from hospital or the stay at [facility]. He has an area on his lower sacrum which appears currently to be a stage II but per their description this is actually improved quite a bit. He has an area on the left lateral foot at roughly the base of the fifth metatarsal, the left Achilles heel and the right fifth metatarsal head laterally they have been using topical antibiotics. Seen by primary care yesterday and placed on Keflex The patient had arterial studies on 05/05/2020. I wondered whether this had something to do with wounds on his feet at that time but I have not been able to determine this. At that time his ABI on the right was 1.09 with a TBI of 0.65 on the left 0.88 with a TBI of 0.44. Waveforms were triphasic on the right monophasic on the left. He likely has significant PAD on the left Past medical history includes type 2 diabetes poorly controlled last hemoglobin A1c at 12.7 in May, hypertension, hyperlipidemia, atrial fib on Eliquis, coronary artery disease status post CABG, left scapular fracture in May of this year, AAA repair. Bilateral leg  weakness ABIs were not done in our clinic as they were just done in May of this year at [facility] 7/14; patient arrives back in clinic he has areas on the left Achilles heel, base of the left fifth metatarsal, right lateral fifth metatarsal. The area he had over the coccyx appears to be healed but this area remains vulnerable. We have been using Iodoflex. I rereviewed his ABIs which were done in May. I do not know exactly what prompted these test however they do show a TBI of 0.44 on the left and monophasic waveforms. 7/21; the patient's wounds are about the same. Certainly no healing and the nurses noted some green drainage on the right fifth metatarsal head, left Achilles a little larger. We have been using silver alginate really without any improvement. We did get an appointment with vascular surgery however its that it did the end of August and they seem to want to repeat the noninvasive test that they have already done in May. I am not sure what the issue here is. We advised the patient's sister to call and point to the idea that they have already had noninvasive studies. The real question I have is does this patient need an angiogram. He is a diabetic and it is possible that the ABIs are falsely elevated because of medial calcification. At least on the left his TBI was abnormal 08/08/2020 upon evaluation today patient appears to be doing about the same in regard to his wounds. He still waiting the appointment with vascular. This is something that hopefully should be undertaken shortly. The 29th of this month is when he is actually scheduled although they have put in a call to move this up if at all possible. Fortunately there does not appear to be any signs of active infection at this time. No fevers, chills, nausea, vomiting, or diarrhea. 8/18; appointment with Dr. Trula Slade on 8/29. The patient has wounds on the left Achilles heel, left lateral  foot and the right lateral fifth metatarsal  head. We have been using Santyl under Hydrofera Blue. The surface of especially the left heel looks better to me than what I remember. 9/1; since the patient was last here he underwent angiography by Dr. Trula Slade he also underwent noninvasive studies first showing a ABI of of 0.64 with monophasic waveforms. His TBI was 0.35 with a left great toe pressure of 46 this is about the same pattern as we saw previously. The left was a lot worse than the right. He underwent an angiogram on 08/29/2020. He had multiple 80% peroneal artery stenosis which was his dominant vessel which was treated with angioplasty with no residual stenosis. 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 The anterior tibial artery was also occluded. An attempt was made at recanalization of the anterior tibial artery however this was unsuccessful. Noted that the peroneal artery reconstitutes the posterior tibial artery and the anterior tibial artery creating the plantar arch. The patient has areas on the Achilles left heel left lateral foot and right lateral fifth metatarsal head we have been using Santyl and Hydrofera Blue 9/22; since the patient was last here he went for repeat vascular studies. On the right his ABI was 0.94 with biphasic waveforms TBI of 0.73 with a great toe pressure of 75. On the left which is the problematic wound his ABI was 1.08 but with biphasic waveforms at the PTA. Biphasic waveforms at the dorsalis pedis his TBI was 0.63 with a great toe pressure of 65. On the left however this does represent a fairly marked improvement. We have been using Santyl and Hydrofera Blue 9/29 left Achilles area. Still is having necrotic surface. Bleeds very easily as the patient is on a combination of Eliquis aspirin and Plavix just stopped yesterday. I am not going to do any debridement until next week. We are using Santyl and Hydrofera Blue. His arterial status on the right was actually quite good. The area on the fifth  metatarsal head needs additional debridement. A total contact cast in this area is not out of the question. He does describe claudication I believe with minimal activity. He is not very active X-ray we ordered last week is still pending in terms of the report 10/6 left Achilles area. In terms of the necrotic surface this is quite a bit better. There is still some debris in the posterior part of the wound however this looks better. Anteriorly the wound appears to be epithelialized there is still a divot in the middle of this but this does not probe to deep tissue. The area on the right fifth metatarsal head laterally really is not making as much progress as I would have liked. Still not completely a viable surface. Both the patient and his wife agree that he is not an external rotator in terms of ambulation [an outie]. We have been using Santyl and Hydrofera Blue for a prolonged period. On the left Achilles which looked like it is worse wound this seems to be making some progress not so much on the right foot. The patient and his wife state that they are rigorous about offloading both of these areas He was treated with angioplasty as I believe on the right peroneal artery that had multiple stenosis. This is a dominant vessel the anterior tibial and posterior tibial arteries are occluded however the foot is fed via collaterals from the peroneal. From my notes I am not able to understand the vascular supply on the left I  will have to review this. 10/13; left Achilles top part of this looks healthy granulation in the bottom part of this wound again completely necrotic requiring debridement. The area on the right lateral fifth met head also requires debridement. The majority of this is epithelialized however he she there is a refractory part to this. 10/20; left Achilles 100% better looking wound surface. Necrotic upper 50% seems to have resolved at least by inspection under illumination. Culture I did  of this area last week postdebridement showed Proteus and Citrobacter. Both sensitive to quinolones and I put him on ciprofloxacin. We have been using silver alginate on the wound on the heel and Santyl and Hydrofera Blue on the right fifth met head. 10/27; left Achilles is measuring much smaller we are using topical gentamicin under silver alginate. Right fifth met head still fibrinous debris on the surface we are using Santyl and Hydrofera Blue. Patient lost his brother suddenly last week he was unable to keep his vascular surgery appointment. 11/3; left Achilles not as viable surface as I might like. Some hypergranulation. Right fifth metatarsal head still completely nonviable surface. It turns out that she was not using topical gentamicin on the left heel [mother] they have been using Santyl and Hydrofera Blue on the right fifth metatarsal head. The patient did have noninvasive vascular studies on 10/ 28/22. He had triphasic waveforms up to the level of the proximal PTA and then a 50 to 74% stenosis in the mid PTA. Peroneal artery was triphasic as was the anterior tibial. He follows up with Dr. Trula Slade on 11/7. Electronic Signature(s) Signed: 11/03/2020 5:02:06 PM By: Linton Ham MD Entered By: Linton Ham on 11/03/2020 09:29:48 -------------------------------------------------------------------------------- Physical Exam Details Patient Name: Date of Service: [name] THA N L. 11/03/2020 8:00 A M Medical Record Number: [id number] Patient Account Number: [id number] Date of Birth/Sex: Treating RN: January 08, 1955 (65 y.o. [name] Primary Care Provider: [name] Other Clinician: Referring Provider: Treating Provider/Extender: [name] in Treatment: 17 Constitutional Sitting or standing Blood Pressure is within target range for patient.. Pulse regular and within target range for patient.Marland Kitchen Respirations regular, non-labored and within target  range.. Temperature is normal and within the target range for the patient.Marland Kitchen Appears in no distress. Cardiovascular On the left he has a palpable posterior tibial but not a palpable dorsalis pedis on the right palpable dorsalis pedis but not a posterior tibial. Notes Wound exam; left Achilles raised hypergranulation over about 50% of the wound this was new this week I remove this with a #3 curette. Significant bleeding on Eliquis. Hemostasis with a pressure dressing and silver nitrate The right fifth met head wound completely necrotic surface. I remove this with a #3 curette. Hemostasis again with silver nitrate. Electronic Signature(s) Signed: 11/03/2020 5:02:06 PM By: Linton Ham MD Entered By: Linton Ham on 11/03/2020 09:31:10 -------------------------------------------------------------------------------- Physician Orders Details Patient Name: Date of Service: [name] THA N L. 11/03/2020 8:00 A M Medical Record Number: [id number] Patient Account Number: [id number] Date of Birth/Sex: Treating RN: [birth year] (65 y.o. Ulyses Amor, Vaughan Basta Primary Care Provider: [name] Other Clinician: Referring Provider: Treating Provider/Extender: [name] in Treatment: [phone] Verbal / Phone Orders: No Diagnosis Coding ICD-10 Coding Code Description E11.621 Type 2 diabetes mellitus with foot ulcer E11.51 Type 2 diabetes mellitus with diabetic peripheral angiopathy without gangrene L97.528 Non-pressure chronic ulcer of other part of left foot with other specified severity L97.511 Non-pressure chronic ulcer of other part  of right foot limited to breakdown of skin E11.42 Type 2 diabetes mellitus with diabetic polyneuropathy Follow-up Appointments ppointment in 1 week. - Dr. Dellia Nims Return A Bathing/ Shower/ Hygiene May shower and wash wound with soap and water. - with dressing changes only. Edema Control - Lymphedema / SCD / Other Elevate legs to the level of  the heart or above for 30 minutes daily and/or when sitting, a frequency of: - 3-4 times a day throughout the day. Off-Loading Turn and reposition every 2 hours Other: - float heels while resting in bed or chair. Use a pillow or rolled sheet under legs to offload the heels. Home Health New wound care orders this week; continue Home Health for wound care. May utilize formulary equivalent dressing for wound treatment orders unless otherwise specified. - discontinue gentamicin cream Other Home Health Orders/Instructions: - [facility] home health three times a week and family to change all other days. Wound Treatment Wound #1 - Calcaneus Wound Laterality: Left Cleanser: Soap and Water (Home Health) 1 x Per Day/30 Days Discharge Instructions: May shower and wash wound with dial antibacterial soap and water prior to dressing change. Cleanser: Wound Cleanser (Home Health) 1 x Per Day/30 Days Discharge Instructions: Cleanse the wound with wound cleanser prior to applying a clean dressing using gauze sponges, not tissue or cotton balls. Prim Dressing: Hydrofera Blue Classic Foam, 2x2 in 1 x Per Day/30 Days ary Discharge Instructions: Moisten with saline prior to applying to wound bed Prim Dressing: Santyl Ointment 1 x Per Day/30 Days ary Discharge Instructions: Apply nickel thick amount to wound bed as instructed Secondary Dressing: Woven Gauze Sponge, Non-Sterile 4x4 in (Home Health) 1 x Per Day/30 Days Discharge Instructions: Apply over primary dressing as directed. Secondary Dressing: ALLEVYN Heel 4 1/2in x 5 1/2in / 10.5cm x 13.5cm (Home Health) 1 x Per Day/30 Days Discharge Instructions: Apply over primary dressing as directed. Secured With: [employer], 4.5x3.1 (in/yd) (Home Health) 1 x Per Day/30 Days Discharge Instructions: Secure with Kerlix as directed. Secured With: 69M Medipore H Soft Cloth Surgical T 4 x 2 (in/yd) (Home Health) 1 x Per Day/30 Days ape Discharge Instructions:  Secure dressing with tape as directed. Wound #3 - Foot Wound Laterality: Right, Lateral Cleanser: Soap and Water (Home Health) 1 x Per Day/30 Days Discharge Instructions: May shower and wash wound with dial antibacterial soap and water prior to dressing change. Cleanser: Wound Cleanser (Home Health) 1 x Per Day/30 Days Discharge Instructions: Cleanse the wound with wound cleanser prior to applying a clean dressing using gauze sponges, not tissue or cotton balls. Prim Dressing: Hydrofera Blue Classic Foam, 4x4 in (Home Health) 1 x Per Day/30 Days ary Discharge Instructions: Moisten with saline prior to applying to wound bed Prim Dressing: Santyl Ointment ([place]) 1 x Per Day/30 Days ary Discharge Instructions: Apply nickel thick amount to wound bed as instructed Secondary Dressing: Woven Gauze Sponge, Non-Sterile 4x4 in (Home Health) 1 x Per Day/30 Days Discharge Instructions: Apply over primary dressing as directed. Secured With: [employer], 4.5x3.1 (in/yd) (Home Health) 1 x Per Day/30 Days Discharge Instructions: Secure with Kerlix as directed. Secured With: 69M Medipore H Soft Cloth Surgical T 4 x 2 (in/yd) (Home Health) 1 x Per Day/30 Days ape Discharge Instructions: Secure dressing with tape as directed. Electronic Signature(s) Signed: 11/03/2020 5:02:06 PM By: Linton Ham MD Signed: 11/03/2020 6:04:58 PM By: Baruch Gouty RN, BSN Entered By: Baruch Gouty on 11/03/2020 08:52:48 -------------------------------------------------------------------------------- Problem List Details Patient Name: Date  of Service: [name] THA N L. 11/03/2020 8:00 A M Medical Record Number: [id number] Patient Account Number: [id number] Date of Birth/Sex: Treating RN: [birth year] (65 y.o. [name] Primary Care Provider: [name] Other Clinician: Referring Provider: Treating Provider/Extender: [name] in Treatment: 17 Active  Problems ICD-10 Encounter Code Description Active Date MDM Diagnosis E11.621 Type 2 diabetes mellitus with foot ulcer 07/07/2020 No Yes E11.51 Type 2 diabetes mellitus with diabetic peripheral angiopathy without gangrene 09/01/2020 No Yes L97.528 Non-pressure chronic ulcer of other part of left foot with other specified 07/07/2020 No Yes severity L97.511 Non-pressure chronic ulcer of other part of right foot limited to breakdown of 07/07/2020 No Yes skin E11.42 Type 2 diabetes mellitus with diabetic polyneuropathy 07/07/2020 No Yes Inactive Problems ICD-10 Code Description Active Date Inactive Date L89.152 Pressure ulcer of sacral region, stage 2 07/07/2020 07/07/2020 M21.115 Pressure ulcer of left heel, stage 2 07/07/2020 07/07/2020 Resolved Problems Electronic Signature(s) Signed: 11/03/2020 5:02:06 PM By: Linton Ham MD Entered By: Linton Ham on 11/03/2020 09:24:51 -------------------------------------------------------------------------------- Progress Note Details Patient Name: Date of Service: [name] THA N L. 11/03/2020 8:00 A M Medical Record Number: [id number] Patient Account Number: [id number] Date of Birth/Sex: Treating RN: 1955-10-01 (65 y.o. [name] Primary Care Provider: [name] Other Clinician: Referring Provider: Treating Provider/Extender: [name] in Treatment: 17 Subjective History of Present Illness (HPI) ADMISSION 07/07/2020 This is a 65 year old man who is a type II diabetic. I have reviewed this in epic. It appeared that his diabetes was diagnosed in 2009 at which time he was on metformin. He was followed for a period of time by an endocrinologist with [facility] to always labeled him as a type II diabetic. Recently in epic there is been a switch to type 1 diabetes. If there were defining antibody test done I do not see them and I think the totality of the evidence would suggest that he is a type II diabetic. His recent  problem started in May when he fell down 17 stairs. He suffered a scapular fracture he was hospitalized at [facility] for 9 days and then discharged to [facility]. His mother who is present said that most of these wounds seem to develop surrounding the discharge from hospital or the stay at [facility]. He has an area on his lower sacrum which appears currently to be a stage II but per their description this is actually improved quite a bit. He has an area on the left lateral foot at roughly the base of the fifth metatarsal, the left Achilles heel and the right fifth metatarsal head laterally they have been using topical antibiotics. Seen by primary care yesterday and placed on Keflex The patient had arterial studies on 05/05/2020. I wondered whether this had something to do with wounds on his feet at that time but I have not been able to determine this. At that time his ABI on the right was 1.09 with a TBI of 0.65 on the left 0.88 with a TBI of 0.44. Waveforms were triphasic on the right monophasic on the left. He likely has significant PAD on the left Past medical history includes type 2 diabetes poorly controlled last hemoglobin A1c at 12.7 in May, hypertension, hyperlipidemia, atrial fib on Eliquis, coronary artery disease status post CABG, left scapular fracture in May of this year, AAA repair. Bilateral leg weakness ABIs were not done in our clinic as they were just done in May of  this year at vein and vascular 7/14; patient arrives back in clinic he has areas on the left Achilles heel, base of the left fifth metatarsal, right lateral fifth metatarsal. The area he had over the coccyx appears to be healed but this area remains vulnerable. We have been using Iodoflex. I rereviewed his ABIs which were done in May. I do not know exactly what prompted these test however they do show a TBI of 0.44 on the left and monophasic waveforms. 7/21; the patient's wounds are about the same. Certainly no healing and  the nurses noted some green drainage on the right fifth metatarsal head, left Achilles a little larger. We have been using silver alginate really without any improvement. We did get an appointment with vascular surgery however its that it did the end of August and they seem to want to repeat the noninvasive test that they have already done in May. I am not sure what the issue here is. We advised the patient's sister to call and point to the idea that they have already had noninvasive studies. The real question I have is does this patient need an angiogram. He is a diabetic and it is possible that the ABIs are falsely elevated because of medial calcification. At least on the left his TBI was abnormal 08/08/2020 upon evaluation today patient appears to be doing about the same in regard to his wounds. He still waiting the appointment with vascular. This is something that hopefully should be undertaken shortly. The 29th of this month is when he is actually scheduled although they have put in a call to move this up if at all possible. Fortunately there does not appear to be any signs of active infection at this time. No fevers, chills, nausea, vomiting, or diarrhea. 8/18; appointment with Dr. Trula Slade on 8/29. The patient has wounds on the left Achilles heel, left lateral foot and the right lateral fifth metatarsal head. We have been using Santyl under Hydrofera Blue. The surface of especially the left heel looks better to me than what I remember. 9/1; since the patient was last here he underwent angiography by Dr. Trula Slade he also underwent noninvasive studies first showing a ABI of of 0.64 with monophasic waveforms. His TBI was 0.35 with a left great toe pressure of 46 this is about the same pattern as we saw previously. The left was a lot worse than the right. He underwent an angiogram on 08/29/2020. He had multiple 80% peroneal artery stenosis which was his dominant vessel which was treated with angioplasty  with no residual stenosis. 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 The anterior tibial artery was also occluded. An attempt was made at recanalization of the anterior tibial artery however this was unsuccessful. Noted that the peroneal artery reconstitutes the posterior tibial artery and the anterior tibial artery creating the plantar arch. The patient has areas on the Achilles left heel left lateral foot and right lateral fifth metatarsal head we have been using Santyl and Hydrofera Blue 9/22; since the patient was last here he went for repeat vascular studies. On the right his ABI was 0.94 with biphasic waveforms TBI of 0.73 with a great toe pressure of 75. On the left which is the problematic wound his ABI was 1.08 but with biphasic waveforms at the PTA. Biphasic waveforms at the dorsalis pedis his TBI was 0.63 with a great toe pressure of 65. On the left however this does represent a fairly marked improvement. We have been using Santyl  and Hydrofera Blue 9/29 left Achilles area. Still is having necrotic surface. Bleeds very easily as the patient is on a combination of Eliquis aspirin and Plavix just stopped yesterday. I am not going to do any debridement until next week. We are using Santyl and Hydrofera Blue. His arterial status on the right was actually quite good. The area on the fifth metatarsal head needs additional debridement. A total contact cast in this area is not out of the question. He does describe claudication I believe with minimal activity. He is not very active X-ray we ordered last week is still pending in terms of the report 10/6 left Achilles area. In terms of the necrotic surface this is quite a bit better. There is still some debris in the posterior part of the wound however this looks better. Anteriorly the wound appears to be epithelialized there is still a divot in the middle of this but this does not probe to deep tissue. The area on the right fifth metatarsal  head laterally really is not making as much progress as I would have liked. Still not completely a viable surface. Both the patient and his wife agree that he is not an external rotator in terms of ambulation [an outie]. We have been using Santyl and Hydrofera Blue for a prolonged period. On the left Achilles which looked like it is worse wound this seems to be making some progress not so much on the right foot. The patient and his wife state that they are rigorous about offloading both of these areas He was treated with angioplasty as I believe on the right peroneal artery that had multiple stenosis. This is a dominant vessel the anterior tibial and posterior tibial arteries are occluded however the foot is fed via collaterals from the peroneal. From my notes I am not able to understand the vascular supply on the left I will have to review this. 10/13; left Achilles top part of this looks healthy granulation in the bottom part of this wound again completely necrotic requiring debridement. oo The area on the right lateral fifth met head also requires debridement. The majority of this is epithelialized however he she there is a refractory part to this. 10/20; left Achilles 100% better looking wound surface. Necrotic upper 50% seems to have resolved at least by inspection under illumination. Culture I did of this area last week postdebridement showed Proteus and Citrobacter. Both sensitive to quinolones and I put him on ciprofloxacin. We have been using silver alginate on the wound on the heel and Santyl and Hydrofera Blue on the right fifth met head. 10/27; left Achilles is measuring much smaller we are using topical gentamicin under silver alginate. Right fifth met head still fibrinous debris on the surface we are using Santyl and Hydrofera Blue. Patient lost his brother suddenly last week he was unable to keep his vascular surgery appointment. 11/3; left Achilles not as viable surface as I might  like. Some hypergranulation. Right fifth metatarsal head still completely nonviable surface. It turns out that she was not using topical gentamicin on the left heel [mother] they have been using Santyl and Hydrofera Blue on the right fifth metatarsal head. The patient did have noninvasive vascular studies on 10/ 28/22. He had triphasic waveforms up to the level of the proximal PTA and then a 50 to 74% stenosis in the mid PTA. Peroneal artery was triphasic as was the anterior tibial. He follows up with Dr. Trula Slade on 11/7. Objective Constitutional Sitting or standing  Blood Pressure is within target range for patient.. Pulse regular and within target range for patient.Marland Kitchen Respirations regular, non-labored and within target range.. Temperature is normal and within the target range for the patient.Marland Kitchen Appears in no distress. Vitals Time Taken: 7:55 AM, Height: 74 in, Weight: 151 lbs, BMI: 19.4, Temperature: 98.2 F, Pulse: 73 bpm, Respiratory Rate: 18 breaths/min, Blood Pressure: 111/72 mmHg. Cardiovascular On the left he has a palpable posterior tibial but not a palpable dorsalis pedis on the right palpable dorsalis pedis but not a posterior tibial. General Notes: Wound exam; left Achilles raised hypergranulation over about 50% of the wound this was new this week I remove this with a #3 curette. Significant bleeding on Eliquis. Hemostasis with a pressure dressing and silver nitrate oo The right fifth met head wound completely necrotic surface. I remove this with a #3 curette. Hemostasis again with silver nitrate. Integumentary (Hair, Skin) Wound #1 status is Open. Original cause of wound was Pressure Injury. The date acquired was: 05/23/2020. The wound has been in treatment 17 weeks. The wound is located on the Left Calcaneus. The wound measures 1.9cm length x 1.3cm width x 0.6cm depth; 1.94cm^2 area and 1.164cm^3 volume. There is Fat Layer (Subcutaneous Tissue) exposed. There is no tunneling noted,  however, there is undermining starting at 12:00 and ending at 1:00 with a maximum distance of 0.6cm. There is a medium amount of sanguinous drainage noted. The wound margin is distinct with the outline attached to the wound base. There is medium (34-66%) pink, friable, hyper - granulation within the wound bed. There is a medium (34-66%) amount of necrotic tissue within the wound bed including Adherent Slough. Wound #3 status is Open. Original cause of wound was Gradually Appeared. The date acquired was: 06/21/2020. The wound has been in treatment 17 weeks. The wound is located on the Right,Lateral Foot. The wound measures 1.1cm length x 1.2cm width x 0.1cm depth; 1.037cm^2 area and 0.104cm^3 volume. There is Fat Layer (Subcutaneous Tissue) exposed. There is no tunneling noted, however, there is undermining starting at 6:00 and ending at 11:00 with a maximum distance of 0.2cm. There is a small amount of serosanguineous drainage noted. The wound margin is well defined and not attached to the wound base. There is medium (34-66%) pink granulation within the wound bed. There is a medium (34-66%) amount of necrotic tissue within the wound bed including Adherent Slough. 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 There was a Excisional Skin/Subcutaneous Tissue Debridement with a total area of 2.47 sq cm performed by Ricard Dillon., MD. With the following instrument(s): Curette to remove Viable and Non-Viable tissue/material. Material removed includes Subcutaneous  Tissue and Slough and after achieving pain control using Other (benzocaine 20% sprAY). No specimens were taken. A time out was conducted at 08:45, prior to the start of the procedure. A Minimum amount of bleeding was controlled with Silver Nitrate. The procedure was tolerated well with a pain level of 0 throughout and a pain level of 0 following the procedure. Post Debridement Measurements: 1.9cm length x 1.3cm width x 0.6cm depth; 1.164cm^3 volume. Post debridement Stage noted  as Category/Stage IV. Character of Wound/Ulcer Post Debridement is improved. Post procedure Diagnosis Wound #1: Same as Pre-Procedure Wound #3 Pre-procedure diagnosis of Wound #3 is a Diabetic Wound/Ulcer of the Lower Extremity located on the Right,Lateral Foot .Severity of Tissue Pre Debridement is: Fat layer exposed. There was a Excisional Skin/Subcutaneous Tissue Debridement with a total area of 1.32 sq cm performed by Ricard Dillon., MD. With the following instrument(s): Curette to remove Viable and Non-Viable tissue/material. Material removed includes Subcutaneous Tissue and Slough and after achieving pain control using Other (benzocaine 20% sprAY). No specimens were taken. A time out was conducted at 08:45, prior to the start of the procedure. A Minimum amount of bleeding was controlled with Silver Nitrate. The procedure was tolerated well with a pain level of 0 throughout and a pain level of 0 following the procedure. Post Debridement Measurements: 1.1cm length x 1.2cm width x 0.1cm depth; 0.104cm^3 volume. Character of Wound/Ulcer Post Debridement is improved. Severity of Tissue Post Debridement is: Fat layer exposed. Post procedure Diagnosis Wound #3: Same as Pre-Procedure Plan Follow-up Appointments: Return Appointment in 1 week. - Dr. Dellia Nims Bathing/ Shower/ Hygiene: May shower and wash wound with soap and water. - with dressing changes only. Edema Control - Lymphedema / SCD / Other: Elevate legs to the  level of the heart or above for 30 minutes daily and/or when sitting, a frequency of: - 3-4 times a day throughout the day. Off-Loading: Turn and reposition every 2 hours Other: - float heels while resting in bed or chair. Use a pillow or rolled sheet under legs to offload the heels. Home Health: New wound care orders this week; continue Home Health for wound care. May utilize formulary equivalent dressing for wound treatment orders unless otherwise specified. - discontinue gentamicin cream Other Home Health Orders/Instructions: - [facility] home health three times a week and family to change all other days. WOUND #1: - Calcaneus Wound Laterality: Left Cleanser: Soap and Water (Home Health) 1 x Per Day/30 Days Discharge Instructions: May shower and wash wound with dial antibacterial soap and water prior to dressing change. Cleanser: Wound Cleanser (Home Health) 1 x Per Day/30 Days Discharge Instructions: Cleanse the wound with wound cleanser prior to applying a clean dressing using gauze sponges, not tissue or cotton balls. Prim Dressing: Hydrofera Blue Classic Foam, 2x2 in 1 x Per Day/30 Days ary Discharge Instructions: Moisten with saline prior to applying to wound bed Prim Dressing: Santyl Ointment 1 x Per Day/30 Days ary Discharge Instructions: Apply nickel thick amount to wound bed as instructed Secondary Dressing: Woven Gauze Sponge, Non-Sterile 4x4 in (Home Health) 1 x Per Day/30 Days Discharge Instructions: Apply over primary dressing as directed. Secondary Dressing: ALLEVYN Heel 4 1/2in x 5 1/2in / 10.5cm x 13.5cm (Home Health) 1 x Per Day/30 Days Discharge Instructions: Apply over primary dressing as directed. Secured With: [employer], 4.5x3.1 (in/yd) (Home Health) 1 x Per Day/30 Days Discharge Instructions: Secure with Kerlix as directed. Secured With: 26M Medipore H Soft Cloth Surgical T 4 x 2 (in/yd) (Home Health) 1 x Per Day/30 Days ape Discharge Instructions: Secure  dressing with tape as directed. WOUND #3: - Foot Wound Laterality: Right, Lateral Cleanser: Soap and Water (Home Health) 1 x Per Day/30 Days Discharge Instructions: May shower and wash wound with dial antibacterial soap and water prior to dressing change. Cleanser: Wound Cleanser (Home Health) 1 x Per Day/30 Days Discharge Instructions: Cleanse the wound with wound cleanser prior to applying a clean dressing  using gauze sponges, not tissue or cotton balls. Prim Dressing: Hydrofera Blue Classic Foam, 4x4 in (Home Health) 1 x Per Day/30 Days ary Discharge Instructions: Moisten with saline prior to applying to wound bed Prim Dressing: Santyl Ointment ([place]) 1 x Per Day/30 Days ary Discharge Instructions: Apply nickel thick amount to wound bed as instructed Secondary Dressing: Woven Gauze Sponge, Non-Sterile 4x4 in (Home Health) 1 x Per Day/30 Days Discharge Instructions: Apply over primary dressing as directed. Secured With: [employer], 4.5x3.1 (in/yd) (Home Health) 1 x Per Day/30 Days Discharge Instructions: Secure with Kerlix as directed. Secured With: 40M Medipore H Soft Cloth Surgical T 4 x 2 (in/yd) (Home Health) 1 x Per Day/30 Days ape Discharge Instructions: Secure dressing with tape as directed. 1. Debridement of both locations 2. I change the primary dressing to Santyl under Hydrofera Blue to both locations. The area on the right fifth metatarsal head laterally was largely unchanged still with a nonviable surface. I was disappointed in the condition of the left Achilles wound last week this looked quite healthy 3. Consider [facility] next week although I will look forward to Dr. Trula Slade suggestions Electronic Signature(s) Signed: 11/03/2020 5:02:06 PM By: Linton Ham MD Entered By: Linton Ham on 11/03/2020 09:32:24 -------------------------------------------------------------------------------- SuperBill Details Patient Name: Date of Service: [name] [name]  [facility] THA N L. 11/03/2020 Medical Record Number: [id number] Patient Account Number: [id number] Date of Birth/Sex: Treating RN: [birth year] (65 y.o. [name] Primary Care Provider: [name] Other Clinician: Referring Provider: Treating Provider/Extender: [name] in Treatment: 17 Diagnosis Coding ICD-10 Codes Code Description [id number] Type 2 diabetes mellitus with foot ulcer E11.51 Type 2 diabetes mellitus with diabetic peripheral angiopathy without gangrene L97.528 Non-pressure chronic ulcer of other part of left foot with other specified severity L97.511 Non-pressure chronic ulcer of other part of right foot limited to breakdown of skin E11.42 Type 2 diabetes mellitus with diabetic polyneuropathy Facility Procedures CPT4 Code: [id number] Description: 14709 - DEB SUBQ TISSUE 20 SQ CM/< ICD-10 Diagnosis Description L97.528 Non-pressure chronic ulcer of other part of left foot with other specified sev L97.511 Non-pressure chronic ulcer of other part of right foot limited to breakdown of Modifier: erity skin Quantity: 1 Physician Procedures : CPT4 Code Description Modifier [id number] - WC PHYS SUBQ TISS 20 SQ CM ICD-10 Diagnosis Description L97.528 Non-pressure chronic ulcer of other part of left foot with other specified severity L97.511 Non-pressure chronic ulcer of other part of right  foot limited to breakdown of skin Quantity: 1 Electronic Signature(s) Signed: 11/03/2020 5:02:06 PM By: Linton Ham MD Entered By: Linton Ham on 11/03/2020 09:32:48

## 2020-11-03 NOTE — Progress Notes (Signed)
[name] [name], [name] [name] ([id number]) Visit Report for [name] Arrival Information Details Patient Name: Date of Service: [name] [name] [facility] [place] THA N L. [name] 8:00 A M Medical Record Number: [id number] Patient Account Number: [id number] Date of Birth/Sex: Treating RN: 08-06-1955 (65 y.o. [name] [name] Primary Care [name] [name]: [name] [name] Other Clinician: Referring [name] [name]: Treating [name] [name]/Extender: [name] [name] in Treatment: 27 Visit Information History Since Last Visit Added or deleted any medications: No Patient Arrived: Wheel Chair Any new allergies or adverse reactions: No Arrival Time: 07:55 Had a fall or experienced change in No Accompanied By: mother activities of daily living that may affect Transfer Assistance: None risk of falls: Patient Requires Transmission-Based Precautions: No Signs or symptoms of abuse/neglect since last visito No Patient Has Alerts: Yes Hospitalized since last visit: No Patient Alerts: Patient on Blood Thinner Implantable device outside of the clinic excluding No cellular tissue based products placed in the center since last visit: Has Dressing in Place as Prescribed: Yes Pain Present Now: No Electronic Signature(s) Signed: 11/03/2020 5:54:47 PM By: [name] Catholic RN Entered By: [name] [name] on 11/03/2020 07:56:38 -------------------------------------------------------------------------------- Encounter Discharge Information Details Patient Name: Date of Service: [name] [name] [name] [name] 8:00 A M Medical Record Number: [id number] Patient Account Number: [id number] Date of Birth/Sex: Treating RN: 1955/11/06 (65 y.o. [name] [name] Primary Care [name] [name]: [name] [name] Other Clinician: Referring [name]: Treating Lasharn Bufkin/Extender: [name] [name] in Treatment: 17 Encounter Discharge Information Items Post Procedure Vitals Discharge Condition: Stable Temperature (F):  98.2 Ambulatory Status: Wheelchair Pulse (bpm): 73 Discharge Destination: Home Respiratory Rate (breaths/min): 18 Transportation: Private Auto Blood Pressure (mmHg): 111/72 Accompanied By: mother Schedule Follow-up Appointment: Yes Clinical Summary of Care: Patient Declined Electronic Signature(s) Signed: 11/03/2020 6:04:58 PM By: [name] Gouty RN, BSN Entered By: [name] [name] on 11/03/2020 09:13:25 -------------------------------------------------------------------------------- Lower Extremity Assessment Details Patient Name: Date of Service: [name] [name] [name] [name] 8:00 A M Medical Record Number: [id number] Patient Account Number: [id number] Date of Birth/Sex: Treating RN: [birth year] (65 y.o. [name] [name] Primary Care [name] [name]: [name] [name] Other Clinician: Referring [name] [name]: Treating [name] [name]/Extender: [name] [name] in Treatment: 17 Edema Assessment Assessed: [name]: No] [name]: No] Edema: [Left: Yes] [Right: No] Calf Left: Right: Point of Measurement: 34 cm From Medial Instep 29 cm 30.9 cm Ankle Left: Right: Point of Measurement: 10 cm From Medial Instep 23.2 cm 22.7 cm Vascular Assessment Pulses: Dorsalis Pedis Palpable: [Right:Yes] Posterior Tibial Palpable: [Right:Yes] Electronic Signature(s) Signed: 11/03/2020 5:54:47 PM By: [name] Catholic RN Signed: 11/03/2020 6:11:26 PM By: [name] Pilling RN, BSN Entered By: [name] [name] on 11/03/2020 08:04:39 -------------------------------------------------------------------------------- Multi Wound Chart Details Patient Name: Date of Service: [name] [name] [name] [name] 8:00 A M Medical Record Number: [id number] Patient Account Number: [id number] Date of Birth/Sex: Treating RN: 08/01/1955 (65 y.o. [name] [name] Primary Care [name] [name]: [name] [name] Other Clinician: Referring [name] [name]: Treating [name] [name]/Extender: [name] [name] in Treatment:  17 Vital Signs Height(in): 39 Pulse(bpm): 47 Weight(lbs): 151 Blood Pressure(mmHg): 111/72 Body Mass Index(BMI): 19 Temperature(F): 98.2 Respiratory Rate(breaths/min): 18 Photos: [1:Left Calcaneus] [3:Right, Lateral Foot] [N/A:N/A N/A] Wound Location: [1:Pressure Injury] [3:Gradually Appeared] [N/A:N/A] Wounding Event: [1:Pressure Ulcer] [3:Diabetic Wound/Ulcer of the Lower] [N/A:N/A] Primary Etiology: [1:Coronary Artery Disease,] [3:Extremity Coronary Artery Disease,] [N/A:N/A] Comorbid History: [1:Hypertension, Type II Diabetes, Osteoarthritis, Neuropathy 05/23/2020] [3:Hypertension, Type II Diabetes, Osteoarthritis, Neuropathy 06/21/2020] [N/A:N/A] Date Acquired: [1:17] [3:17] [N/A:N/A] Weeks of Treatment: [1:Open] [3:Open] [N/A:N/A] Wound Status: [1:1.9x1.3x0.6] [3:1.1x1.2x0.1] [  N/A:N/A] Measurements L x W x D (cm) [1:1.94] [3:1.037] [N/A:N/A] A (cm) : rea [1:1.164] [3:0.104] [N/A:N/A] Volume (cm) : [1:84.60%] [3:48.20%] [N/A:N/A] % Reduction in A [1:rea: 53.70%] [3:74.10%] [N/A:N/A] % Reduction in Volume: [1:12] [3:6] Starting Position 1 (o'clock): [1:1] [3:11] Ending Position 1 (o'clock): [1:0.6] [3:0.2] Maximum Distance 1 (cm): [1:Yes] [3:Yes] [N/A:N/A] Undermining: [1:Category/Stage IV] [3:Grade 2] [N/A:N/A] Classification: [1:Medium] [3:Small] [N/A:N/A] Exudate A mount: [1:Sanguinous] [3:Serosanguineous] [N/A:N/A] Exudate Type: [1:red] [3:red, brown] [N/A:N/A] Exudate Color: [1:Distinct, outline attached] [3:Well defined, not attached] [N/A:N/A] Wound Margin: [1:Medium (34-66%)] [3:Medium (34-66%)] [N/A:N/A] Granulation A mount: [1:Pink, Hyper-granulation, Friable] [3:Pink] [N/A:N/A] Granulation Quality: [1:Medium (34-66%)] [3:Medium (34-66%)] [N/A:N/A] Necrotic A mount: [1:Fat Layer (Subcutaneous Tissue): Yes Fat Layer (Subcutaneous Tissue): Yes N/A] Exposed Structures: [1:Fascia: No Tendon: No Muscle: No Joint: No Bone: No Small (1-33%)] [3:Fascia: No Tendon: No  Muscle: No Joint: No Bone: No Small (1-33%)] [N/A:N/A] Epithelialization: [1:Debridement - Excisional] [3:Debridement - Excisional] [N/A:N/A] Debridement: Pre-procedure Verification/Time Out 08:45 [3:08:45] [N/A:N/A] Taken: [1:Other] [3:Other] [N/A:N/A] Pain Control: [1:Subcutaneous, Slough] [3:Subcutaneous, Slough] [N/A:N/A] Tissue Debrided: [1:Skin/Subcutaneous Tissue] [3:Skin/Subcutaneous Tissue] [N/A:N/A] Level: [1:2.47] [3:1.32] [N/A:N/A] Debridement A (sq cm): [1:rea Curette] [3:Curette] [N/A:N/A] Instrument: [1:Minimum] [3:Minimum] [N/A:N/A] Bleeding: [1:Silver Nitrate] [3:Silver Nitrate] [N/A:N/A] Hemostasis A chieved: [1:0] [3:0] [N/A:N/A] Procedural Pain: [1:0] [3:0] [N/A:N/A] Post Procedural Pain: [1:Procedure was tolerated well] [3:Procedure was tolerated well] [N/A:N/A] Debridement Treatment Response: [1:1.9x1.3x0.6] [3:1.1x1.2x0.1] [N/A:N/A] Post Debridement Measurements L x W x D (cm) [1:1.164] [3:0.104] [N/A:N/A] Post Debridement Volume: (cm) [1:Category/Stage IV] [3:N/A] [N/A:N/A] Post Debridement Stage: [1:Debridement] [3:Debridement] [N/A:N/A] Treatment Notes Wound #1 (Calcaneus) Wound Laterality: Left Cleanser Soap and Water Discharge Instruction: May shower and wash wound with dial antibacterial soap and water prior to dressing change. Wound Cleanser Discharge Instruction: Cleanse the wound with wound cleanser prior to applying a clean dressing using gauze sponges, not tissue or cotton balls. Peri-Wound Care Topical Primary Dressing Hydrofera Blue Classic Foam, 2x2 in Discharge Instruction: Moisten with saline prior to applying to wound bed Santyl Ointment Discharge Instruction: Apply nickel thick amount to wound bed as instructed Secondary Dressing Woven Gauze Sponge, Non-Sterile 4x4 in Discharge Instruction: Apply over primary dressing as directed. ALLEVYN Heel 4 1/2in x 5 1/2in / 10.5cm x 13.5cm Discharge Instruction: Apply over primary dressing as  directed. Secured With [employer], 4.5x3.1 (in/yd) Discharge Instruction: Secure with Kerlix as directed. 38M Medipore H Soft Cloth Surgical T 4 x 2 (in/yd) ape Discharge Instruction: Secure dressing with tape as directed. Compression Wrap Compression Stockings Add-Ons Wound #3 (Foot) Wound Laterality: Right, Lateral Cleanser Soap and Water Discharge Instruction: May shower and wash wound with dial antibacterial soap and water prior to dressing change. Wound Cleanser Discharge Instruction: Cleanse the wound with wound cleanser prior to applying a clean dressing using gauze sponges, not tissue or cotton balls. Peri-Wound Care Topical Primary Dressing Hydrofera Blue Classic Foam, 4x4 in Discharge Instruction: Moisten with saline prior to applying to wound bed Santyl Ointment Discharge Instruction: Apply nickel thick amount to wound bed as instructed Secondary Dressing Woven Gauze Sponge, Non-Sterile 4x4 in Discharge Instruction: Apply over primary dressing as directed. Secured With [employer], 4.5x3.1 (in/yd) Discharge Instruction: Secure with Kerlix as directed. 38M Medipore H Soft Cloth Surgical T 4 x 2 (in/yd) ape Discharge Instruction: Secure dressing with tape as directed. Compression Wrap Compression Stockings Add-Ons Electronic Signature(s) Signed: 11/03/2020 5:02:06 PM By: Linton Ham MD Signed: 11/03/2020 6:11:26 PM By: [name] Pilling RN, BSN Entered By: Linton Ham on 11/03/2020 09:26:19 -------------------------------------------------------------------------------- Multi-Disciplinary Care Plan Details Patient  Name: Date of Service: [name] [name] [facility] THA N L. [name] 8:00 A M Medical Record Number: [id number] Patient Account Number: [id number] Date of Birth/Sex: Treating RN: [birth year] (65 y.o. [name] [name] Primary Care Jader Desai: [name] [name] Other Clinician: Referring Ahja Martello: Treating Jorja Empie/Extender: [name] [name] in Treatment: [place] reviewed with physician Active Inactive Necrotic Tissue Nursing Diagnoses: Impaired tissue integrity related to necrotic/devitalized tissue Knowledge deficit related to management of necrotic/devitalized tissue Goals: Necrotic/devitalized tissue will be minimized in the wound bed Date Initiated: 09/22/2020 Target Resolution Date: 12/02/2020 Goal Status: Active Patient/caregiver will verbalize understanding of reason and process for debridement of necrotic tissue Date Initiated: 09/22/2020 Date Inactivated: 10/06/2020 Target Resolution Date: 10/28/2020 Goal Status: Met Interventions: Provide education on necrotic tissue and debridement process Treatment Activities: Enzymatic debridement : 09/22/2020 Excisional debridement : 09/22/2020 Notes: Wound/Skin Impairment Nursing Diagnoses: Knowledge deficit related to ulceration/compromised skin integrity Goals: Patient/caregiver will verbalize understanding of skin care regimen Date Initiated: 07/07/2020 Target Resolution Date: 11/25/2020 Goal Status: Active Interventions: Assess patient/caregiver ability to obtain necessary supplies Assess patient/caregiver ability to perform ulcer/skin care regimen upon admission and as needed Provide education on ulcer and skin care Treatment Activities: Skin care regimen initiated : 07/07/2020 Topical wound management initiated : 07/07/2020 Notes: Electronic Signature(s) Signed: 11/03/2020 6:04:58 PM By: [name] Gouty RN, BSN Entered By: [name] [name] on 11/03/2020 08:43:56 -------------------------------------------------------------------------------- Pain Assessment Details Patient Name: Date of Service: [name] [name] [name] [name] 8:00 A M Medical Record Number: [id number] Patient Account Number: [id number] Date of Birth/Sex: Treating RN: May 30, 1955 (65 y.o. [name] [name] Primary Care Channon Ambrosini: [name] [name] Other  Clinician: Referring Skyah Hannon: Treating Noelly Lasseigne/Extender: [name] [name] in Treatment: 17 Active Problems Location of Pain Severity and Description of Pain Patient Has Paino No Site Locations Rate the pain. Rate the pain. Current Pain Level: 0 Pain Management and Medication Current Pain Management: Medication: No Cold Application: No Rest: No Massage: No Activity: No T.E.N.S.: No Heat Application: No Leg drop or elevation: No Is the Current Pain Management Adequate: Adequate How does your wound impact your activities of daily livingo Sleep: No Bathing: No Appetite: No Relationship With Others: No Bladder Continence: No Emotions: No Bowel Continence: No Work: No Toileting: No Drive: No Dressing: No Hobbies: No Electronic Signature(s) Signed: 11/03/2020 5:54:47 PM By: [name] Catholic RN Signed: 11/03/2020 6:11:26 PM By: [name] Pilling RN, BSN Entered By: [name] [name] on 11/03/2020 07:57:31 -------------------------------------------------------------------------------- Patient/Caregiver Education Details Patient Name: Date of Service: [name] [name] [name] THA N L. 11/3/2022andnbsp8:00 A M Medical Record Number: [id number] Patient Account Number: [id number] Date of Birth/Gender: Treating RN: 05-22-1955 (65 y.o. [name] [name] Primary Care Physician: [name] [name] Other Clinician: Referring Physician: Treating Physician/Extender: [name] [name] in Treatment: 58 Education Assessment Education Provided To: Patient Education Topics Provided Infection: Methods: Explain/Verbal Responses: Reinforcements needed, State content correctly Tissue Oxygenation: Methods: Explain/Verbal Responses: Reinforcements needed, State content correctly Wound/Skin Impairment: Methods: Explain/Verbal Responses: Reinforcements needed, State content correctly Electronic Signature(s) Signed: 11/03/2020 6:04:58 PM By: [name] Gouty RN,  BSN Entered By: [name] [name] on 11/03/2020 08:44:32 -------------------------------------------------------------------------------- Wound Assessment Details Patient Name: Date of Service: [name] [name] [name] [name] 8:00 A M Medical Record Number: [id number] Patient Account Number: [id number] Date of Birth/Sex: Treating RN: [birth year] (65 y.o. [name] [name] Primary Care Loveta Dellis: [name] [name] Other Clinician: Referring Pepper Kerrick: Treating Thanh Pomerleau/Extender: [name] [name] in Treatment: 17 Wound Status Wound Number: 1 Primary  Pressure Ulcer Etiology: Wound Location: Left Calcaneus Wound Open Wounding Event: Pressure Injury Status: Date Acquired: 05/23/2020 Comorbid Coronary Artery Disease, Hypertension, Type II Diabetes, Weeks Of Treatment: 17 History: Osteoarthritis, Neuropathy Clustered Wound: No Photos Wound Measurements Length: (cm) 1.9 Width: (cm) 1.3 Depth: (cm) 0.6 Area: (cm) 1.94 Volume: (cm) 1.164 % Reduction in Area: 84.6% % Reduction in Volume: 53.7% Epithelialization: Small (1-33%) Tunneling: No Undermining: Yes Starting Position (o'clock): 12 Ending Position (o'clock): 1 Maximum Distance: (cm) 0.6 Wound Description Classification: Category/Stage IV Wound Margin: Distinct, outline attached Exudate Amount: Medium Exudate Type: Sanguinous Exudate Color: red Foul Odor After Cleansing: No Slough/Fibrino Yes Wound Bed Granulation Amount: Medium (34-66%) Exposed Structure Granulation Quality: Pink, Hyper-granulation, Friable Fascia Exposed: No Necrotic Amount: Medium (34-66%) Fat Layer (Subcutaneous Tissue) Exposed: Yes Necrotic Quality: Adherent Slough Tendon Exposed: No Muscle Exposed: No Joint Exposed: No Bone Exposed: No Treatment Notes Wound #1 (Calcaneus) Wound Laterality: Left Cleanser Soap and Water Discharge Instruction: May shower and wash wound with dial antibacterial soap and water prior to dressing  change. Wound Cleanser Discharge Instruction: Cleanse the wound with wound cleanser prior to applying a clean dressing using gauze sponges, not tissue or cotton balls. Peri-Wound Care Topical Primary Dressing Hydrofera Blue Classic Foam, 2x2 in Discharge Instruction: Moisten with saline prior to applying to wound bed Santyl Ointment Discharge Instruction: Apply nickel thick amount to wound bed as instructed Secondary Dressing Woven Gauze Sponge, Non-Sterile 4x4 in Discharge Instruction: Apply over primary dressing as directed. ALLEVYN Heel 4 1/2in x 5 1/2in / 10.5cm x 13.5cm Discharge Instruction: Apply over primary dressing as directed. Secured With [employer], 4.5x3.1 (in/yd) Discharge Instruction: Secure with Kerlix as directed. 84M Medipore H Soft Cloth Surgical T 4 x 2 (in/yd) ape Discharge Instruction: Secure dressing with tape as directed. Compression Wrap Compression Stockings Add-Ons Electronic Signature(s) Signed: 11/03/2020 6:04:58 PM By: [name] Gouty RN, BSN Signed: 11/03/2020 6:11:26 PM By: [name] Pilling RN, BSN Entered By: [name] [name] on 11/03/2020 08:45:09 -------------------------------------------------------------------------------- Wound Assessment Details Patient Name: Date of Service: [name] [name] [name] [name] 8:00 A M Medical Record Number: [id number] Patient Account Number: [id number] Date of Birth/Sex: Treating RN: 11/07/1955 (65 y.o. [name], [name] Primary Care Corbett Moulder: [name] [name] Other Clinician: Referring Leandra Vanderweele: Treating Racheal Mathurin/Extender: [name] [name] in Treatment: 17 Wound Status Wound Number: 3 Primary Diabetic Wound/Ulcer of the Lower Extremity Etiology: Wound Location: Right, Lateral Foot Wound Open Wounding Event: Gradually Appeared Status: Date Acquired: 06/21/2020 Comorbid Coronary Artery Disease, Hypertension, Type II Diabetes, Weeks Of Treatment: 17 History: Osteoarthritis,  Neuropathy Clustered Wound: No Photos Wound Measurements Length: (cm) 1.1 Width: (cm) 1.2 Depth: (cm) 0.1 Area: (cm) 1.037 Volume: (cm) 0.104 % Reduction in Area: 48.2% % Reduction in Volume: 74.1% Epithelialization: Small (1-33%) Tunneling: No Undermining: Yes Starting Position (o'clock): 6 Ending Position (o'clock): 11 Maximum Distance: (cm) 0.2 Wound Description Classification: Grade 2 Wound Margin: Well defined, not attached Exudate Amount: Small Exudate Type: Serosanguineous Exudate Color: red, brown Foul Odor After Cleansing: No Slough/Fibrino Yes Wound Bed Granulation Amount: Medium (34-66%) Exposed Structure Granulation Quality: Pink Fascia Exposed: No Necrotic Amount: Medium (34-66%) Fat Layer (Subcutaneous Tissue) Exposed: Yes Necrotic Quality: Adherent Slough Tendon Exposed: No Muscle Exposed: No Joint Exposed: No Bone Exposed: No Treatment Notes Wound #3 (Foot) Wound Laterality: Right, Lateral Cleanser Soap and Water Discharge Instruction: May shower and wash wound with dial antibacterial soap and water prior to dressing change. Wound Cleanser Discharge Instruction: Cleanse the wound with wound cleanser  prior to applying a clean dressing using gauze sponges, not tissue or cotton balls. Peri-Wound Care Topical Primary Dressing Hydrofera Blue Classic Foam, 4x4 in Discharge Instruction: Moisten with saline prior to applying to wound bed Santyl Ointment Discharge Instruction: Apply nickel thick amount to wound bed as instructed Secondary Dressing Woven Gauze Sponge, Non-Sterile 4x4 in Discharge Instruction: Apply over primary dressing as directed. Secured With [employer], 4.5x3.1 (in/yd) Discharge Instruction: Secure with Kerlix as directed. 84M Medipore H Soft Cloth Surgical T 4 x 2 (in/yd) ape Discharge Instruction: Secure dressing with tape as directed. Compression Wrap Compression Stockings Add-Ons Electronic Signature(s) Signed:  11/03/2020 5:54:47 PM By: [name] Catholic RN Signed: 11/03/2020 6:11:26 PM By: [name] Pilling RN, BSN Entered By: [name] [name] on 11/03/2020 15:86:82 -------------------------------------------------------------------------------- Vitals Details Patient Name: Date of Service: [name] [name] [name] [name] 8:00 A M Medical Record Number: [id number] Patient Account Number: [id number] Date of Birth/Sex: Treating RN: Jun 05, 1955 (65 y.o. [name], [name] Primary Care Sherrill Buikema: [name] [name] Other Clinician: Referring Tala Eber: Treating Tauno Falotico/Extender: [name] [name] in Treatment: 17 Vital Signs Time Taken: 07:55 Temperature (F): 98.2 Height (in): 74 Pulse (bpm): 73 Weight (lbs): 151 Respiratory Rate (breaths/min): 18 Body Mass Index (BMI): 19.4 Blood Pressure (mmHg): 111/72 Reference Range: 80 - 120 mg / dl Electronic Signature(s) Signed: 11/03/2020 5:54:47 PM By: [name] Catholic RN Entered By: [name] [name] on 11/03/2020 07:57:09

## 2020-11-04 DIAGNOSIS — L89154 Pressure ulcer of sacral region, stage 4: Secondary | ICD-10-CM | POA: Diagnosis not present

## 2020-11-04 DIAGNOSIS — L97429 Non-pressure chronic ulcer of left heel and midfoot with unspecified severity: Secondary | ICD-10-CM | POA: Diagnosis not present

## 2020-11-04 DIAGNOSIS — L97529 Non-pressure chronic ulcer of other part of left foot with unspecified severity: Secondary | ICD-10-CM | POA: Diagnosis not present

## 2020-11-04 DIAGNOSIS — L97519 Non-pressure chronic ulcer of other part of right foot with unspecified severity: Secondary | ICD-10-CM | POA: Diagnosis not present

## 2020-11-07 ENCOUNTER — Other Ambulatory Visit: Payer: Self-pay

## 2020-11-07 ENCOUNTER — Encounter: Payer: Self-pay | Admitting: Surgery

## 2020-11-07 ENCOUNTER — Ambulatory Visit (INDEPENDENT_AMBULATORY_CARE_PROVIDER_SITE_OTHER): Payer: HMO | Admitting: Surgery

## 2020-11-07 VITALS — BP 99/64 | HR 72 | Temp 97.9°F | Resp 20 | Ht 74.0 in | Wt 151.0 lb

## 2020-11-07 DIAGNOSIS — I7025 Atherosclerosis of native arteries of other extremities with ulceration: Secondary | ICD-10-CM

## 2020-11-07 NOTE — Progress Notes (Addendum)
[facility] [place]  Patient name: [name] [name] MRN: [id number] DOB: 14-Feb-1955 Sex: male   REASON FOR VISIT:    Follow up  [place]:   [name] [name] is a 65 y.o. male, who is referred for evaluation of a left heel wound which has been present for approximately 2 months.  Prior to that, he did not really complain of any claudication symptoms although he was having some leg pain.  He is getting treated at the wound center.  On 08/30/2020 he underwent angiography and had multiple high-grade lesions treated in his left peroneal artery.  This was the dominant runoff vessel.  I attempted to recanalize his anterior tibial artery, however this was unsuccessful.  He is back today for follow-up.  The left heel ulcer has slightly improved.  The right foot now has a wound on the lateral aspect.  The patient suffers from diabetes.  He is scheduled to get a insulin monitor in the near future.  He has a history of coronary artery disease, status post CABG.  Takes a statin for hypercholesterolemia and is medically managed for hypertension.  He is an occasional smoker.   PAST MEDICAL HISTORY:   Past Medical History:  Diagnosis Date   Anxiety    Arthritis    Constipation    pt states goes EOD- hard stools    Coronary artery disease    Depression    Diabetes mellitus without complication (HCC)    DKA (diabetic ketoacidoses)    Hyperlipidemia    Hypertension    off meds   Psoriasis      FAMILY HISTORY:   Family History  Problem Relation Age of Onset   Lymphoma Father    Cancer Paternal Grandmother        Colon Cancer   Colon cancer Paternal Grandmother    Colon polyps Neg Hx    Esophageal cancer Neg Hx    Rectal cancer Neg Hx    Stomach cancer Neg Hx    Diabetes Mellitus I Neg Hx     SOCIAL HISTORY:   Social History   Tobacco Use   Smoking status: Some Days    Types: Cigars   Smokeless  tobacco: Never  Substance Use Topics   Alcohol use: Yes    Comment: daily beer or scotch      ALLERGIES:   No Known Allergies   CURRENT MEDICATIONS:   Current Outpatient Medications  Medication Sig Dispense Refill   apixaban (ELIQUIS) 5 MG TABS tablet Take 1 tablet (5 mg total) by mouth 2 (two) times daily. 60 tablet 2   aspirin EC 325 MG tablet Take 325 mg by mouth daily.     atorvastatin (LIPITOR) 40 MG tablet Take 1 tablet (40 mg total) by mouth daily. 90 tablet 1   B Complex Vitamins (B COMPLEX-B12 PO) Take 1 tablet by mouth daily.      BD PEN NEEDLE NANO 2ND GEN 32G X 4 MM MISC USE FOR INSULIN 300 each 1   Cholecalciferol (VITAMIN D) 125 MCG (5000 UT) CAPS Take 5,000 Units by mouth daily.      Continuous Blood Gluc Sensor (DEXCOM G6 SENSOR) MISC 1 Device by Does not apply route See admin instructions. Change every 10 days 9 each 3   Insulin Aspart FlexPen (NOVOLOG) 100 UNIT/ML Inject 4 Units into the skin 2 (two) times daily with a meal. And pen needles 3/day 15 mL 3   Insulin Disposable Pump (  OMNIPOD 5 G6 INTRO, GEN 5,) KIT 1 Device by Does not apply route every 3 (three) days. 30 kit 3   insulin glargine (LANTUS SOLOSTAR) 100 UNIT/ML Solostar Pen Inject 30 Units into the skin every morning. 30 mL PRN   lisinopril (ZESTRIL) 5 MG tablet TAKE 1 TABLET BY MOUTH DAILY 90 tablet 1   mirtazapine (REMERON) 15 MG tablet Take 1 tablet (15 mg total) by mouth at bedtime. 90 tablet 1   PARoxetine (PAXIL) 20 MG tablet Take 1 tablet (20 mg total) by mouth daily. 90 tablet 1   SANTYL ointment Apply 1 application topically daily.     traZODone (DESYREL) 50 MG tablet Take 1 tablet (50 mg total) by mouth at bedtime. 90 tablet 1   No current facility-administered medications for this visit.    REVIEW OF SYSTEMS:   _0  denotes positive finding, _1  denotes negative finding Cardiac  Comments:  Chest pain or chest pressure:    Shortness of breath upon exertion:    Short of breath when lying  flat:    Irregular heart rhythm:        Vascular    Pain in calf, thigh, or hip brought on by ambulation:    Pain in feet at night that wakes you up from your sleep:     Blood clot in your veins:    Leg swelling:         Pulmonary    Oxygen at home:    Productive cough:     Wheezing:         Neurologic    Sudden weakness in arms or legs:     Sudden numbness in arms or legs:     Sudden onset of difficulty speaking or slurred speech:    Temporary loss of vision in one eye:     Problems with dizziness:         Gastrointestinal    Blood in stool:     Vomited blood:         Genitourinary    Burning when urinating:     Blood in urine:        Psychiatric    Major depression:         Hematologic    Bleeding problems:    Problems with blood clotting too easily:        Skin    Rashes or ulcers: x       Constitutional    Fever or chills:      PHYSICAL EXAM:   Vitals:   11/07/20 0846  BP: 99/64  Pulse: 72  Resp: 20  Temp: 97.9 F (36.6 C)  SpO2: 95%  Weight: 151 lb (68.5 kg)  Height: _2  (1.88 m)    GENERAL: The patient is a well-nourished male, in no acute distress. The vital signs are documented above. CARDIAC: There is a regular rate and rhythm.  VASCULAR: Nonpalpable pedal pulses PULMONARY: Non-labored respirations ABDOMEN: Soft and non-tender with normal pitched bowel sounds.  MUSCULOSKELETAL: There are no major deformities or cyanosis. NEUROLOGIC: No focal weakness or paresthesias are detected. SKIN: See photo below. PSYCHIATRIC: The patient has a normal affect. +-------+-----------+-----------+------------+------------+  ABI/TBIToday's ABIToday's TBIPrevious ABIPrevious TBI  +-------+-----------+-----------+------------+------------+  Right  1.03       0.73       0.90        0.75          +-------+-----------+-----------+------------+------------+  Left   1.08       0.63  0.64        0.35           +-------+-----------+-----------+------------+------------+       STUDIES:   I have reviewed his vascular studies with the following findings: Right: 50-74% stenosis (by ratio) noted in the mid posterior tibial  artery.   Aorta:: Abdominal Aorta Findings:  +-----------+-------+----------+----------+--------+--------+--------+  Location   AP (cm)Trans (cm)PSV (cm/s)WaveformThrombusComments  +-----------+-------+----------+----------+--------+--------+--------+  Supraceliac                 83                                  +-----------+-------+----------+----------+--------+--------+--------+  Proximal   2.50   2.30      116                                 +-----------+-------+----------+----------+--------+--------+--------+  Mid        3.00   3.43      51                        fusiform  +-----------+-------+----------+----------+--------+--------+--------+  Distal     2.60   2.60      32                                  +-----------+-------+----------+----------+--------+--------+--------+  RT CIA Prox1.9    1.9       35                                  +-----------+-------+----------+----------+--------+--------+--------+  RT EIA Prox                 69                                  +-----------+-------+----------+----------+--------+--------+--------+  LT CIA Prox1.6    1.7       43                                  +-----------+-------+----------+----------+--------+--------+--------+  +-------+-----------+-----------+------------+------------+  ABI/TBIToday's ABIToday's TBIPrevious ABIPrevious TBI  +-------+-----------+-----------+------------+------------+  Right  1.03       0.73       0.90        0.75          +-------+-----------+-----------+------------+------------+  Left   1.08       0.63       0.64        0.35          +-------+-----------+-----------+------------+------------+   Right toe pressure 75 Left toe pressure 65  MEDICAL ISSUES:   Left heel ulcer: The patient underwent angiography with intervention to his peroneal artery which collateralizes to the posterior tibial artery.  I tried open his anterior tibial artery but was unsuccessful.  His wound persists but has become more superficial.  He should continue with the wound center and pressure offloading.  I will evaluate this with angiography when I work on his right leg.  Right leg ulcer: This is a limb threatening situation.  I suspect he also has tibial disease on  the right.  I told him that we needed to proceed with angiography via a left femoral approach for intervention of the right leg which will likely be his tibial vessels.  This needs to be done in the near future.  He is going to work with his schedule to coordinate a time.  He will need to be off of his Eliquis.  My plan will be for left femoral approach with bilateral runoff to evaluate his prior left leg intervention and intervene on the right leg if appropriate.  AAA: Maximum aortic diameter is 3.4 cm.  He will need an ultrasound in 2 years.  Leia Alf, MD, FACS [facility] [phone] Pager [phone]

## 2020-11-07 NOTE — H&P (View-Only) (Signed)
[facility] [place]  Patient name: [name] [name] MRN: [id number] DOB: 14-Feb-1955 Sex: male   REASON FOR VISIT:    Follow up  [place]:   [name] [name] is a 65 y.o. male, who is referred for evaluation of a left heel wound which has been present for approximately 2 months.  Prior to that, he did not really complain of any claudication symptoms although he was having some leg pain.  He is getting treated at the wound center.  On 08/30/2020 he underwent angiography and had multiple high-grade lesions treated in his left peroneal artery.  This was the dominant runoff vessel.  I attempted to recanalize his anterior tibial artery, however this was unsuccessful.  He is back today for follow-up.  The left heel ulcer has slightly improved.  The right foot now has a wound on the lateral aspect.  The patient suffers from diabetes.  He is scheduled to get a insulin monitor in the near future.  He has a history of coronary artery disease, status post CABG.  Takes a statin for hypercholesterolemia and is medically managed for hypertension.  He is an occasional smoker.   PAST MEDICAL HISTORY:   Past Medical History:  Diagnosis Date   Anxiety    Arthritis    Constipation    pt states goes EOD- hard stools    Coronary artery disease    Depression    Diabetes mellitus without complication (HCC)    DKA (diabetic ketoacidoses)    Hyperlipidemia    Hypertension    off meds   Psoriasis      FAMILY HISTORY:   Family History  Problem Relation Age of Onset   Lymphoma Father    Cancer Paternal Grandmother        Colon Cancer   Colon cancer Paternal Grandmother    Colon polyps Neg Hx    Esophageal cancer Neg Hx    Rectal cancer Neg Hx    Stomach cancer Neg Hx    Diabetes Mellitus I Neg Hx     SOCIAL HISTORY:   Social History   Tobacco Use   Smoking status: Some Days    Types: Cigars   Smokeless  tobacco: Never  Substance Use Topics   Alcohol use: Yes    Comment: daily beer or scotch      ALLERGIES:   No Known Allergies   CURRENT MEDICATIONS:   Current Outpatient Medications  Medication Sig Dispense Refill   apixaban (ELIQUIS) 5 MG TABS tablet Take 1 tablet (5 mg total) by mouth 2 (two) times daily. 60 tablet 2   aspirin EC 325 MG tablet Take 325 mg by mouth daily.     atorvastatin (LIPITOR) 40 MG tablet Take 1 tablet (40 mg total) by mouth daily. 90 tablet 1   B Complex Vitamins (B COMPLEX-B12 PO) Take 1 tablet by mouth daily.      BD PEN NEEDLE NANO 2ND GEN 32G X 4 MM MISC USE FOR INSULIN 300 each 1   Cholecalciferol (VITAMIN D) 125 MCG (5000 UT) CAPS Take 5,000 Units by mouth daily.      Continuous Blood Gluc Sensor (DEXCOM G6 SENSOR) MISC 1 Device by Does not apply route See admin instructions. Change every 10 days 9 each 3   Insulin Aspart FlexPen (NOVOLOG) 100 UNIT/ML Inject 4 Units into the skin 2 (two) times daily with a meal. And pen needles 3/day 15 mL 3   Insulin Disposable Pump (  OMNIPOD 5 G6 INTRO, GEN 5,) KIT 1 Device by Does not apply route every 3 (three) days. 30 kit 3   insulin glargine (LANTUS SOLOSTAR) 100 UNIT/ML Solostar Pen Inject 30 Units into the skin every morning. 30 mL PRN   lisinopril (ZESTRIL) 5 MG tablet TAKE 1 TABLET BY MOUTH DAILY 90 tablet 1   mirtazapine (REMERON) 15 MG tablet Take 1 tablet (15 mg total) by mouth at bedtime. 90 tablet 1   PARoxetine (PAXIL) 20 MG tablet Take 1 tablet (20 mg total) by mouth daily. 90 tablet 1   SANTYL ointment Apply 1 application topically daily.     traZODone (DESYREL) 50 MG tablet Take 1 tablet (50 mg total) by mouth at bedtime. 90 tablet 1   No current facility-administered medications for this visit.    REVIEW OF SYSTEMS:   _0  denotes positive finding, _1  denotes negative finding Cardiac  Comments:  Chest pain or chest pressure:    Shortness of breath upon exertion:    Short of breath when lying  flat:    Irregular heart rhythm:        Vascular    Pain in calf, thigh, or hip brought on by ambulation:    Pain in feet at night that wakes you up from your sleep:     Blood clot in your veins:    Leg swelling:         Pulmonary    Oxygen at home:    Productive cough:     Wheezing:         Neurologic    Sudden weakness in arms or legs:     Sudden numbness in arms or legs:     Sudden onset of difficulty speaking or slurred speech:    Temporary loss of vision in one eye:     Problems with dizziness:         Gastrointestinal    Blood in stool:     Vomited blood:         Genitourinary    Burning when urinating:     Blood in urine:        Psychiatric    Major depression:         Hematologic    Bleeding problems:    Problems with blood clotting too easily:        Skin    Rashes or ulcers: x       Constitutional    Fever or chills:      PHYSICAL EXAM:   Vitals:   11/07/20 0846  BP: 99/64  Pulse: 72  Resp: 20  Temp: 97.9 F (36.6 C)  SpO2: 95%  Weight: 151 lb (68.5 kg)  Height: _2  (1.88 m)    GENERAL: The patient is a well-nourished male, in no acute distress. The vital signs are documented above. CARDIAC: There is a regular rate and rhythm.  VASCULAR: Nonpalpable pedal pulses PULMONARY: Non-labored respirations ABDOMEN: Soft and non-tender with normal pitched bowel sounds.  MUSCULOSKELETAL: There are no major deformities or cyanosis. NEUROLOGIC: No focal weakness or paresthesias are detected. SKIN: See photo below. PSYCHIATRIC: The patient has a normal affect. +-------+-----------+-----------+------------+------------+  ABI/TBIToday's ABIToday's TBIPrevious ABIPrevious TBI  +-------+-----------+-----------+------------+------------+  Right  1.03       0.73       0.90        0.75          +-------+-----------+-----------+------------+------------+  Left   1.08       0.63  0.64        0.35           +-------+-----------+-----------+------------+------------+       STUDIES:   I have reviewed his vascular studies with the following findings: Right: 50-74% stenosis (by ratio) noted in the mid posterior tibial  artery.   Aorta:: Abdominal Aorta Findings:  +-----------+-------+----------+----------+--------+--------+--------+  Location   AP (cm)Trans (cm)PSV (cm/s)WaveformThrombusComments  +-----------+-------+----------+----------+--------+--------+--------+  Supraceliac                 83                                  +-----------+-------+----------+----------+--------+--------+--------+  Proximal   2.50   2.30      116                                 +-----------+-------+----------+----------+--------+--------+--------+  Mid        3.00   3.43      51                        fusiform  +-----------+-------+----------+----------+--------+--------+--------+  Distal     2.60   2.60      32                                  +-----------+-------+----------+----------+--------+--------+--------+  RT CIA Prox1.9    1.9       35                                  +-----------+-------+----------+----------+--------+--------+--------+  RT EIA Prox                 69                                  +-----------+-------+----------+----------+--------+--------+--------+  LT CIA Prox1.6    1.7       43                                  +-----------+-------+----------+----------+--------+--------+--------+  +-------+-----------+-----------+------------+------------+  ABI/TBIToday's ABIToday's TBIPrevious ABIPrevious TBI  +-------+-----------+-----------+------------+------------+  Right  1.03       0.73       0.90        0.75          +-------+-----------+-----------+------------+------------+  Left   1.08       0.63       0.64        0.35          +-------+-----------+-----------+------------+------------+   Right toe pressure 75 Left toe pressure 65  MEDICAL ISSUES:   Left heel ulcer: The patient underwent angiography with intervention to his peroneal artery which collateralizes to the posterior tibial artery.  I tried open his anterior tibial artery but was unsuccessful.  His wound persists but has become more superficial.  He should continue with the wound center and pressure offloading.  I will evaluate this with angiography when I work on his right leg.  Right leg ulcer: This is a limb threatening situation.  I suspect he also has tibial disease on  the right.  I told him that we needed to proceed with angiography via a left femoral approach for intervention of the right leg which will likely be his tibial vessels.  This needs to be done in the near future.  He is going to work with his schedule to coordinate a time.  He will need to be off of his Eliquis.  My plan will be for left femoral approach with bilateral runoff to evaluate his prior left leg intervention and intervene on the right leg if appropriate.  AAA: Maximum aortic diameter is 3.4 cm.  He will need an ultrasound in 2 years.  Leia Alf, MD, FACS [facility] [phone] Pager [phone]

## 2020-11-07 NOTE — Progress Notes (Signed)
[name] Note  11/09/2020     CHIEF COMPLAINT Patient presents for Retina Evaluation   HISTORY OF PRESENT ILLNESS: [name] [name] is a 65 y.o. male who presents to the clinic today for:   HPI     Retina Evaluation   In both eyes.  This started 4 years ago.  Context:  distance vision, mid-range vision and near vision.  I, the attending physician,  performed the HPI with the patient and updated documentation appropriately.        Comments   Retina eval per Dr. Herbert Deaner for PDR OS, NPDR OD- patient states for the past 3-4 years he hasn't been able to see well OU.   BS 230 last night, IDDM x10 years   A1C unsure       Last edited by [name], MD on 11/09/2020 12:48 PM.    Pt is here on the referral of Dr. Herbert Deaner for concern of PDR OU, pt saw her about a week ago, pt states he has had blurry vision for "years", pt had been using reading glasses, but has gotten to the point where they do not working anymore, pt has been type 2 diabetic for many years, but became type 1 last year, pt is on insulin now, but states it is very hard to "dose", pt has been prescribed a dexcom monitor but has not received it yet, he states it has been 2 months that he has been fighting to get it, pt states he is not following his endocrinologists suggestions on how much insulin to use, bc he doesn't think it is enough, but he is too scared to use more, pt has several wounds on his left foot  Referring physician: [name], MD [place],  [place] [id number]  HISTORICAL INFORMATION:   Selected notes from the MEDICAL RECORD NUMBER Referred by Dr. [name] for concern of PDR OU LEE:  Ocular Hx- PMH-    CURRENT MEDICATIONS: No current outpatient medications on file. (Ophthalmic Drugs)   No current facility-administered medications for this visit. (Ophthalmic Drugs)   Current Outpatient Medications (Other)  Medication Sig   apixaban  (ELIQUIS) 5 MG TABS tablet Take 1 tablet (5 mg total) by mouth 2 (two) times daily.   aspirin EC 325 MG tablet Take 325 mg by mouth daily.   atorvastatin (LIPITOR) 40 MG tablet Take 1 tablet (40 mg total) by mouth daily.   B Complex Vitamins (B COMPLEX-B12 PO) Take 1 tablet by mouth daily.    Cholecalciferol (VITAMIN D) 125 MCG (5000 UT) CAPS Take 5,000 Units by mouth daily.    Insulin Aspart FlexPen (NOVOLOG) 100 UNIT/ML Inject 4 Units into the skin 2 (two) times daily with a meal. And pen needles 3/day   insulin glargine (LANTUS SOLOSTAR) 100 UNIT/ML Solostar Pen Inject 30 Units into the skin every morning.   lisinopril (ZESTRIL) 5 MG tablet TAKE 1 TABLET BY MOUTH DAILY   mirtazapine (REMERON) 15 MG tablet Take 1 tablet (15 mg total) by mouth at bedtime.   PARoxetine (PAXIL) 20 MG tablet Take 1 tablet (20 mg total) by mouth daily.   SANTYL ointment Apply 1 application topically daily.   traZODone (DESYREL) 50 MG tablet Take 1 tablet (50 mg total) by mouth at bedtime.   BD PEN NEEDLE NANO 2ND GEN 32G X 4 MM MISC USE FOR INSULIN   Continuous Blood Gluc Sensor (DEXCOM G6 SENSOR) MISC 1 Device by Does not  apply route See admin instructions. Change every 10 days   Insulin Disposable Pump (OMNIPOD 5 G6 INTRO, GEN 5,) KIT 1 Device by Does not apply route every 3 (three) days.   No current facility-administered medications for this visit. (Other)   REVIEW OF SYSTEMS: ROS   Positive for: Musculoskeletal, Endocrine, Cardiovascular, Eyes, Psychiatric Negative for: Constitutional, Gastrointestinal, Neurological, Skin, Genitourinary, HENT, Respiratory, Allergic/Imm, Heme/Lymph Last edited by Leonie Douglas, COA on 11/09/2020  8:24 AM.     ALLERGIES No Known Allergies  PAST MEDICAL HISTORY Past Medical History:  Diagnosis Date   Anxiety    Arthritis    Constipation    pt states goes EOD- hard stools    Coronary artery disease    Depression    Diabetes mellitus without complication (HCC)     DKA (diabetic ketoacidoses)    Hyperlipidemia    Hypertension    off meds   Psoriasis    Past Surgical History:  Procedure Laterality Date   ABDOMINAL AORTOGRAM W/LOWER EXTREMITY N/A 08/30/2020   Procedure: ABDOMINAL AORTOGRAM W/LOWER EXTREMITY;  Surgeon: Serafina Mitchell, MD;  Location: [facility];  Service: Cardiovascular;  Laterality: N/A;   ANKLE SURGERY     right   CARDIAC CATHETERIZATION  09/25/2001   COLONOSCOPY     ~10 yr ago- normal  per pt    CORONARY ARTERY BYPASS GRAFT  09/30/2001   CABG x 4   IR THORACENTESIS ASP PLEURAL SPACE W/IMG GUIDE  05/19/2020   LAPAROSCOPIC INGUINAL HERNIA REPAIR Bilateral 02/09/2010   MASS EXCISION Left 03/12/2013   Procedure: LEFT INDEX EXCISION MASS AND DEBRIDMENT DISTAL INTERPHALANGEAL JOINT;  Surgeon: Tennis Must, MD;  Location: [facility];  Service: Orthopedics;  Laterality: Left;   PERIPHERAL VASCULAR BALLOON ANGIOPLASTY Left 08/30/2020   Procedure: PERIPHERAL VASCULAR BALLOON ANGIOPLASTY;  Surgeon: Serafina Mitchell, MD;  Location: [facility];  Service: Cardiovascular;  Laterality: Left;  Peroneal    FAMILY HISTORY Family History  Problem Relation Age of Onset   Lymphoma Father    Cancer Paternal Grandmother        Colon Cancer   Colon cancer Paternal Grandmother    Colon polyps Neg Hx    Esophageal cancer Neg Hx    Rectal cancer Neg Hx    Stomach cancer Neg Hx    Diabetes Mellitus I Neg Hx     SOCIAL HISTORY Social History   Tobacco Use   Smoking status: Some Days    Types: Cigars   Smokeless tobacco: Never  Vaping Use   Vaping Use: Never used  Substance Use Topics   Alcohol use: Yes    Comment: daily beer or scotch    Drug use: No       OPHTHALMIC EXAM: Base Eye Exam     Visual Acuity (Snellen - Linear)       Right Left   Dist [place] 20/150 +2 CF 4'   Dist ph [place] 20/60 20/350         Tonometry (Tonopen, 8:38 AM)       Right Left   Pressure 11 12         Pupils       Dark  Light Shape React APD   Right 3 2 Round Minimal None   Left 3 2 Round Minimal None         Visual Fields (Counting fingers)       Left Right     Full  Restrictions Total superior nasal deficiency          Extraocular Movement       Right Left    Full Full         Neuro/Psych     Oriented x3: Yes   Mood/Affect: Normal         Dilation     Both eyes: 1.0% Mydriacyl, 2.5% Phenylephrine @ 8:39 AM           Slit Lamp and Fundus Exam     External Exam       Right Left   External Normal Normal         Slit Lamp Exam       Right Left   Lids/Lashes Dermatochalasis - upper lid, Meibomian gland dysfunction Dermatochalasis - upper lid, Meibomian gland dysfunction   Conjunctiva/Sclera White and quiet White and quiet   Cornea arcus, 1+ Punctate epithelial erosions arcus, 1+ Punctate epithelial erosions   Anterior Chamber Deep and quiet Deep and quiet   Iris Round and dilated, No NVI Round and dilated, No NVI   Lens 2-3+ Nuclear sclerosis with brunescence, 2+ Cortical cataract 2-3+ Nuclear sclerosis with brunescence, 2+ Cortical cataract   Vitreous Vitreous syneresis, blood stained vitreous condensations Vitreous syneresis, blood stained vitreous condensations, +fibrosis, boat shaped subhyaloid heme inferior to disc         Fundus Exam       Right Left   Disc Pink and Sharp, +NVD greatest ST quad Pink and Sharp, +florid NVD with fibrosis   C/D Ratio 0.2 0.2   Macula Flat, Blunted foveal reflex, scatterd MA/DBH Flat, scatterd MA, large pre-retinal hemes temporal macula and NVE temporal macula   Vessels attenuated, Tortuous, NVD; NVE along ST arcades attenuated, Tortuous, +NVD; NVE along ST arcades   Periphery Attached; 360 DBH Attached, 360 DBH              Refraction     Manifest Refraction       Sphere Cylinder Axis Dist VA   Right [place] +1.75 015 20/60   Left [place]   NI            IMAGING AND PROCEDURES  Imaging and Procedures for  11/09/2020  OCT, Retina - OU - Both Eyes       Right Eye Quality was good. Central Foveal Thickness: 270. Progression has no prior data. Findings include abnormal foveal contour, no SRF, intraretinal fluid, preretinal fibrosis (Partial PVD, +vitreous opacities).   Left Eye Quality was good. Central Foveal Thickness: 455. Progression has no prior data. Findings include abnormal foveal contour, no SRF, intraretinal fluid, preretinal fibrosis, macular pucker (Tractional fibrosis, vitreous opacities, pre-retinal subhyaloid hyper reflectivity / heme).   Notes *Images captured and stored on drive  Diagnosis / Impression:  PDR with DME OU OD: Partial PVD, +vitreous opacities OS: Tractional fibrosis, vitreous opacities, pre-retinal subhyaloid hyper reflectivity / heme  Clinical management:  See below  Abbreviations: NFP - Normal foveal profile. CME - cystoid macular edema. PED - pigment epithelial detachment. IRF - intraretinal fluid. SRF - subretinal fluid. EZ - ellipsoid zone. ERM - epiretinal membrane. ORA - outer retinal atrophy. ORT - outer retinal tubulation. SRHM - subretinal hyper-reflective material. IRHM - intraretinal hyper-reflective material      Fluorescein Angiography Optos (Transit OS)       Right Eye Early phase findings include neovascularization disc, retinal neovascularization, vascular perfusion defect, microaneurysm, blockage, leakage. Mid/Late phase findings include blockage, leakage, microaneurysm, neovascularization disc, retinal neovascularization,  vascular perfusion defect (Large areas of peripheral non-perfusion, +NVD, foval NVE superior arcades).   Left Eye Early phase findings include blockage, vascular perfusion defect, neovascularization disc, retinal neovascularization, leakage, microaneurysm. Mid/Late phase findings include blockage, leakage, neovascularization disc, retinal neovascularization, vascular perfusion defect, microaneurysm (Severe vascular  non-perfusion 360, severe NVD with leakage).   Notes **Images stored on drive**  Impression: PDR OU OD: Large areas of peripheral non-perfusion, +NVD, focal NVE superior arcades OS: Severe vascular non-perfusion 360, severe NVD with leakage, +NVE           ASSESSMENT/PLAN:    ICD-10-CM   1. Proliferative diabetic retinopathy of both eyes with macular edema associated with type 2 diabetes mellitus ([place])  B44.9675     2. Retinal edema  H35.81 OCT, Retina - OU - Both Eyes    3. Essential hypertension  I10     4. Hypertensive retinopathy of both eyes  H35.033 Fluorescein Angiography Optos (Transit OS)    5. Combined forms of age-related cataract of both eyes  H25.813       1,2. Severe proliferative diabetic retinopathy, both eyes  - A1c >15% on 10.11.22 - The incidence, risk factors for progression, natural history and treatment options for diabetic retinopathy were discussed with patient.   - The need for close monitoring of blood glucose, blood pressure, and serum lipids, avoiding cigarette or any type of tobacco, and the need for long term follow up was also discussed with patient. - exam shows extensive neovascularization, fibrosis and hemorrhage OU - FA (11.09.22) shows areas of vascular non-perfusion OU, +NVD OU and +NVE OU - OCT shows diabetic macular edema, both eyes  - The natural history, pathology, and characteristics of diabetic macular edema discussed with patient.  A generalized discussion of the major clinical trials concerning treatment of diabetic macular edema (ETDRS, DCT, SCORE, RISE / RIDE, and ongoing DRCR net studies) was completed.  This discussion included mention of the various approaches to treating diabetic macular edema (observation, laser photocoagulation, anti-VEGF injections with lucentis / Avastin / Eylea, steroid injections with Kenalog / Ozurdex, and intraocular surgery with vitrectomy).  The goal hemoglobin A1C of 6-7 was discussed, as well as  importance of smoking cessation and hypertension control.  Need for ongoing treatment and monitoring were specifically discussed with reference to chronic nature of diabetic macular edema. - discussed findings, severity of his disease, prognosis and likely need for extensive treatments -- PRP OU, multiple rounds of anti-VEGF therapy OU and possible surgery - recommend PRP OS today, 11.09.22 - pt wishes to come back tomorrow for laser - f/u tomorrow for PRP OS  3,4. Hypertensive retinopathy OU - discussed importance of tight BP control - monitor  5. Mixed Cataract OU - The symptoms of cataract, surgical options, and treatments and risks were discussed with patient. - discussed diagnosis and progression - not yet visually significant - monitor for now  Ophthalmic Meds Ordered this visit:  No orders of the defined types were placed in this encounter.    Return in about 1 day (around 11/10/2020) for f/u PDR OU, DFE, OCT, Laser.  There are no Patient Instructions on file for this visit.   Explained the diagnoses, plan, and follow up with the patient and they expressed understanding.  Patient expressed understanding of the importance of proper follow up care.   This document serves as a record of services personally performed by [name], MD, PhD. It was created on their behalf by Orvan Falconer, an ophthalmic technician. The  creation of this record is the provider's dictation and/or activities during the visit.    Electronically signed by: Orvan Falconer, OA, 11/09/20  12:55 PM  This document serves as a record of services personally performed by [name], MD, PhD. It was created on their behalf by San Jetty. Owens Shark, OA an ophthalmic technician. The creation of this record is the provider's dictation and/or activities during the visit.    Electronically signed by: San Jetty. Marguerita Merles 11.09.2022 12:55 PM  [name], M.D., Ph.D. Diseases & Surgery of the Retina and  Vitreous [facility]  I have reviewed the above documentation for accuracy and completeness, and I agree with the above. [name], M.D., Ph.D. 11/09/20 12:55 PM   Abbreviations: M myopia (nearsighted); A astigmatism; H hyperopia (farsighted); P presbyopia; Mrx spectacle prescription;  CTL contact lenses; OD right eye; OS left eye; OU both eyes  XT exotropia; ET esotropia; PEK punctate epithelial keratitis; PEE punctate epithelial erosions; DES dry eye syndrome; MGD meibomian gland dysfunction; ATs artificial tears; PFAT's preservative free artificial tears; [place] nuclear sclerotic cataract; PSC posterior subcapsular cataract; ERM epi-retinal membrane; PVD posterior vitreous detachment; RD retinal detachment; DM diabetes mellitus; DR diabetic retinopathy; NPDR non-proliferative diabetic retinopathy; PDR proliferative diabetic retinopathy; CSME clinically significant macular edema; DME diabetic macular edema; dbh dot blot hemorrhages; CWS cotton wool spot; POAG primary open angle glaucoma; C/D cup-to-disc ratio; HVF humphrey visual field; GVF goldmann visual field; OCT optical coherence tomography; IOP intraocular pressure; BRVO Branch retinal vein occlusion; CRVO central retinal vein occlusion; CRAO central retinal artery occlusion; BRAO branch retinal artery occlusion; RT retinal tear; SB scleral buckle; PPV pars plana vitrectomy; VH Vitreous hemorrhage; PRP panretinal laser photocoagulation; IVK intravitreal kenalog; VMT vitreomacular traction; MH Macular hole;  NVD neovascularization of the disc; NVE neovascularization elsewhere; AREDS age related eye disease study; ARMD age related macular degeneration; POAG primary open angle glaucoma; EBMD epithelial/anterior basement membrane dystrophy; ACIOL anterior chamber intraocular lens; IOL intraocular lens; PCIOL posterior chamber intraocular lens; Phaco/IOL phacoemulsification with intraocular lens placement; [place] photorefractive  keratectomy; LASIK laser assisted in situ keratomileusis; HTN hypertension; DM diabetes mellitus; COPD chronic obstructive pulmonary disease

## 2020-11-08 ENCOUNTER — Ambulatory Visit: Payer: HMO

## 2020-11-08 ENCOUNTER — Telehealth: Payer: Self-pay | Admitting: Endocrinology

## 2020-11-08 NOTE — Telephone Encounter (Signed)
Heather at [facility] called on PT needing more information on insulin management. Please call PT at [phone]

## 2020-11-09 ENCOUNTER — Encounter (INDEPENDENT_AMBULATORY_CARE_PROVIDER_SITE_OTHER): Payer: Self-pay | Admitting: Ophthalmology

## 2020-11-09 ENCOUNTER — Other Ambulatory Visit: Payer: Self-pay

## 2020-11-09 ENCOUNTER — Ambulatory Visit (INDEPENDENT_AMBULATORY_CARE_PROVIDER_SITE_OTHER): Payer: HMO | Admitting: Ophthalmology

## 2020-11-09 DIAGNOSIS — H35033 Hypertensive retinopathy, bilateral: Secondary | ICD-10-CM

## 2020-11-09 DIAGNOSIS — H3581 Retinal edema: Secondary | ICD-10-CM

## 2020-11-09 DIAGNOSIS — E1042 Type 1 diabetes mellitus with diabetic polyneuropathy: Secondary | ICD-10-CM

## 2020-11-09 DIAGNOSIS — E113513 Type 2 diabetes mellitus with proliferative diabetic retinopathy with macular edema, bilateral: Secondary | ICD-10-CM | POA: Diagnosis not present

## 2020-11-09 DIAGNOSIS — I10 Essential (primary) hypertension: Secondary | ICD-10-CM | POA: Diagnosis not present

## 2020-11-09 DIAGNOSIS — H25813 Combined forms of age-related cataract, bilateral: Secondary | ICD-10-CM

## 2020-11-09 MED ORDER — OMNIPOD 5 DEXG7G6 INTRO GEN 5 KIT: 1.0000 | PACK | 3 refills | Status: DC

## 2020-11-09 NOTE — Telephone Encounter (Signed)
Spoke with pt's mom and she stated that they needed the ominopd kit sent to [facility]. Rx was sent

## 2020-11-10 ENCOUNTER — Ambulatory Visit (INDEPENDENT_AMBULATORY_CARE_PROVIDER_SITE_OTHER): Payer: HMO | Admitting: Ophthalmology

## 2020-11-10 ENCOUNTER — Other Ambulatory Visit: Payer: Self-pay

## 2020-11-10 ENCOUNTER — Encounter (INDEPENDENT_AMBULATORY_CARE_PROVIDER_SITE_OTHER): Payer: Self-pay | Admitting: Ophthalmology

## 2020-11-10 ENCOUNTER — Encounter (HOSPITAL_BASED_OUTPATIENT_CLINIC_OR_DEPARTMENT_OTHER): Payer: HMO | Admitting: Internal Medicine

## 2020-11-10 DIAGNOSIS — H35033 Hypertensive retinopathy, bilateral: Secondary | ICD-10-CM | POA: Diagnosis not present

## 2020-11-10 DIAGNOSIS — E113513 Type 2 diabetes mellitus with proliferative diabetic retinopathy with macular edema, bilateral: Secondary | ICD-10-CM

## 2020-11-10 DIAGNOSIS — I10 Essential (primary) hypertension: Secondary | ICD-10-CM

## 2020-11-10 DIAGNOSIS — H3581 Retinal edema: Secondary | ICD-10-CM

## 2020-11-10 DIAGNOSIS — H25813 Combined forms of age-related cataract, bilateral: Secondary | ICD-10-CM

## 2020-11-10 MED ORDER — PREDNISOLONE ACETATE 1 % OP SUSP: 1.0000 [drp] | Freq: Four times a day (QID) | OPHTHALMIC | 0 refills | Status: AC

## 2020-11-10 NOTE — Progress Notes (Signed)
[facility] Note  11/10/2020     CHIEF COMPLAINT Patient presents for Retina Follow Up  HISTORY OF PRESENT ILLNESS: [name] [name] is a 65 y.o. male who presents to the clinic today for:   HPI     Retina Follow Up   Patient presents with  Diabetic Retinopathy.  In both eyes.  This started days ago.  Severity is moderate.  Duration of 1 day.  Since onset it is rapidly improving.  I, the attending physician,  performed the HPI with the patient and updated documentation appropriately.        Comments   65 y/o male pt here for 1 day f/u for severe NPDR OU.  Here for PRP OS today.  No change in [place] OU noticed, but pt feels like he got a better night's sleep last night.  Denies pain, FOL, but still has some spiderweb-like floaters OU.  No gtts.      Last edited by [name], MD on 11/10/2020 11:20 AM.    Pt here for PRP OS today  Referring physician: [name], MD [place],  [place] [address]  HISTORICAL INFORMATION:   Selected notes from the MEDICAL RECORD NUMBER Referred by Dr. [name] for concern of PDR OU   CURRENT MEDICATIONS: Current Outpatient Medications (Ophthalmic Drugs)  Medication Sig   prednisoLONE acetate (PRED FORTE) 1 % ophthalmic suspension Place 1 drop into the left eye 4 (four) times daily for 7 days.   No current facility-administered medications for this visit. (Ophthalmic Drugs)   Current Outpatient Medications (Other)  Medication Sig   apixaban (ELIQUIS) 5 MG TABS tablet Take 1 tablet (5 mg total) by mouth 2 (two) times daily.   aspirin EC 325 MG tablet Take 325 mg by mouth daily.   atorvastatin (LIPITOR) 40 MG tablet Take 1 tablet (40 mg total) by mouth daily.   B Complex Vitamins (B COMPLEX-B12 PO) Take 1 tablet by mouth daily.    BD PEN NEEDLE NANO 2ND GEN 32G X 4 MM MISC USE FOR INSULIN   Blood Glucose Monitoring Suppl (ONETOUCH VERIO FLEX SYSTEM) w/Device KIT    cephALEXin  (KEFLEX) 500 MG capsule    Cholecalciferol (VITAMIN D) 125 MCG (5000 UT) CAPS Take 5,000 Units by mouth daily.    ciprofloxacin (CIPRO) 500 MG tablet Take 500 mg by mouth 2 (two) times daily.   Continuous Blood Gluc Sensor (DEXCOM G6 SENSOR) MISC 1 Device by Does not apply route See admin instructions. Change every 10 days   cyclobenzaprine (FLEXERIL) 5 MG tablet    HYDROcodone-acetaminophen (NORCO/VICODIN) 5-325 MG tablet    hydrOXYzine (ATARAX/VISTARIL) 10 MG tablet    Insulin Aspart FlexPen (NOVOLOG) 100 UNIT/ML Inject 4 Units into the skin 2 (two) times daily with a meal. And pen needles 3/day   Insulin Disposable Pump (OMNIPOD 5 G6 INTRO, GEN 5,) KIT 1 Device by Does not apply route every 3 (three) days.   insulin glargine (LANTUS SOLOSTAR) 100 UNIT/ML Solostar Pen Inject 30 Units into the skin every morning.   Lancets (ONETOUCH DELICA PLUS [id number]) MISC Apply 1 each topically 4 (four) times daily.   lisinopril (ZESTRIL) 5 MG tablet TAKE 1 TABLET BY MOUTH DAILY   mirtazapine (REMERON) 15 MG tablet Take 1 tablet (15 mg total) by mouth at bedtime.   ONETOUCH VERIO test strip 1 each 4 (four) times daily.   PARoxetine (PAXIL) 20 MG tablet Take 1 tablet (20 mg total)  by mouth daily.   SANTYL ointment Apply 1 application topically daily.   traZODone (DESYREL) 50 MG tablet Take 1 tablet (50 mg total) by mouth at bedtime.   No current facility-administered medications for this visit. (Other)   REVIEW OF SYSTEMS: ROS   Positive for: Endocrine, Eyes Negative for: Constitutional, Gastrointestinal, Neurological, Skin, Genitourinary, Musculoskeletal, HENT, Cardiovascular, Respiratory, Psychiatric, Allergic/Imm, Heme/Lymph Last edited by Matthew Folks, COA on 11/10/2020 10:06 AM.     ALLERGIES No Known Allergies  PAST MEDICAL HISTORY Past Medical History:  Diagnosis Date   Anxiety    Arthritis    Cataract    Constipation    pt states goes EOD- hard stools    Coronary artery disease     Depression    Diabetes mellitus without complication (HCC)    Diabetic retinopathy ([place])    DKA (diabetic ketoacidoses)    Hyperlipidemia    Hypertension    off meds   Hypertensive retinopathy    Psoriasis    Past Surgical History:  Procedure Laterality Date   ABDOMINAL AORTOGRAM W/LOWER EXTREMITY N/A 08/30/2020   Procedure: ABDOMINAL AORTOGRAM W/LOWER EXTREMITY;  Surgeon: Serafina Mitchell, MD;  Location: [facility];  Service: Cardiovascular;  Laterality: N/A;   ANKLE SURGERY     right   CARDIAC CATHETERIZATION  09/25/2001   COLONOSCOPY     ~10 yr ago- normal  per pt    CORONARY ARTERY BYPASS GRAFT  09/30/2001   CABG x 4   IR THORACENTESIS ASP PLEURAL SPACE W/IMG GUIDE  05/19/2020   LAPAROSCOPIC INGUINAL HERNIA REPAIR Bilateral 02/09/2010   MASS EXCISION Left 03/12/2013   Procedure: LEFT INDEX EXCISION MASS AND DEBRIDMENT DISTAL INTERPHALANGEAL JOINT;  Surgeon: Tennis Must, MD;  Location: [facility];  Service: Orthopedics;  Laterality: Left;   PERIPHERAL VASCULAR BALLOON ANGIOPLASTY Left 08/30/2020   Procedure: PERIPHERAL VASCULAR BALLOON ANGIOPLASTY;  Surgeon: Serafina Mitchell, MD;  Location: [facility];  Service: Cardiovascular;  Laterality: Left;  Peroneal    FAMILY HISTORY Family History  Problem Relation Age of Onset   Lymphoma Father    Cancer Paternal Grandmother        Colon Cancer   Colon cancer Paternal Grandmother    Colon polyps Neg Hx    Esophageal cancer Neg Hx    Rectal cancer Neg Hx    Stomach cancer Neg Hx    Diabetes Mellitus I Neg Hx     SOCIAL HISTORY Social History   Tobacco Use   Smoking status: Some Days    Types: Cigars   Smokeless tobacco: Never  Vaping Use   Vaping Use: Never used  Substance Use Topics   Alcohol use: Yes    Comment: daily beer or scotch    Drug use: No       OPHTHALMIC EXAM: Base Eye Exam     Visual Acuity (Snellen - Linear)       Right Left   Dist [place] 20/60 20/100   Dist ph [place]  20/40 20/60 -2         Tonometry (Tonopen, 10:09 AM)       Right Left   Pressure 10 10         Pupils       Dark Light Shape React APD   Right 3 2 Round Minimal None   Left 3 2 Round Minimal None         Visual Fields (Counting fingers)  Left Right     Full   Restrictions Partial outer superior nasal deficiency          Extraocular Movement       Right Left    Full, Ortho Full, Ortho         Neuro/Psych     Oriented x3: Yes   Mood/Affect: Normal         Dilation     Left eye: 1.0% Mydriacyl, 2.5% Phenylephrine @ 10:09 AM           Slit Lamp and Fundus Exam     External Exam       Right Left   External Normal Normal         Slit Lamp Exam       Right Left   Lids/Lashes Dermatochalasis - upper lid, Meibomian gland dysfunction Dermatochalasis - upper lid, Meibomian gland dysfunction   Conjunctiva/Sclera White and quiet White and quiet   Cornea arcus, 1+ Punctate epithelial erosions arcus, 1+ Punctate epithelial erosions   Anterior Chamber Deep and quiet Deep and quiet   Iris Round and dilated, No NVI Round and dilated, No NVI   Lens 2-3+ Nuclear sclerosis with brunescence, 2+ Cortical cataract 2-3+ Nuclear sclerosis with brunescence, 2+ Cortical cataract   Vitreous Vitreous syneresis, blood stained vitreous condensations Vitreous syneresis, blood stained vitreous condensations, +fibrosis, boat shaped subhyaloid heme inferior to disc         Fundus Exam       Right Left   Disc Pink and Sharp, +NVD greatest ST quad Pink and Sharp, +florid NVD with fibrosis   C/D Ratio 0.2 0.2   Macula Flat, Blunted foveal reflex, scatterd MA/DBH Flat, scatterd MA, large pre-retinal hemes temporal macula and NVE temporal macula   Vessels attenuated, Tortuous, NVD; NVE along ST arcades attenuated, Tortuous, +NVD; NVE along ST arcades   Periphery Attached; 360 DBH Attached, 360 DBH               IMAGING AND PROCEDURES  Imaging and Procedures  for 11/10/2020  OCT, Retina - OU - Both Eyes       Right Eye Quality was good. Central Foveal Thickness: 278. Progression has been stable. Findings include abnormal foveal contour, no SRF, intraretinal fluid, preretinal fibrosis (Partial PVD, +vitreous opacities).   Left Eye Quality was good. Central Foveal Thickness: 496. Progression has been stable. Findings include abnormal foveal contour, no SRF, intraretinal fluid, preretinal fibrosis, macular pucker (Tractional fibrosis, vitreous opacities, pre-retinal subhyaloid hyper reflectivity / heme).   Notes *Images captured and stored on drive  Diagnosis / Impression:  PDR with DME OU OD: Partial PVD, +vitreous opacities OS: Tractional fibrosis, vitreous opacities, pre-retinal subhyaloid hyper reflectivity / heme  Clinical management:  See below  Abbreviations: NFP - Normal foveal profile. CME - cystoid macular edema. PED - pigment epithelial detachment. IRF - intraretinal fluid. SRF - subretinal fluid. EZ - ellipsoid zone. ERM - epiretinal membrane. ORA - outer retinal atrophy. ORT - outer retinal tubulation. SRHM - subretinal hyper-reflective material. IRHM - intraretinal hyper-reflective material      Panretinal Photocoagulation - OS - Left Eye       LASER PROCEDURE NOTE  Diagnosis:   Proliferative Diabetic Retinopathy, LEFT EYE  Procedure:  Pan-retinal photocoagulation using slit lamp laser, LEFT EYE  Anesthesia:  Topical  Surgeon: [name], MD, PhD   Informed consent obtained, operative eye marked, and time out performed prior to initiation of laser.   Lumenis Smart532 slit lamp laser  Pattern: 3x3 square Power: 340 mW Duration: 40 msec  Spot size: 200 microns  # spots: [id number] spots   Complications: None.  Notes: significant vitreous heme obscuring view and preventing laser up take inferiorly and scattered focal areas  RTC: Monday for PRP OD  Patient tolerated the procedure well and received written and  verbal post-procedure care information/education.            ASSESSMENT/PLAN:    ICD-10-CM   1. Proliferative diabetic retinopathy of both eyes with macular edema associated with type 2 diabetes mellitus ([place])  U27.2536 Panretinal Photocoagulation - OS - Left Eye    2. Retinal edema  H35.81 OCT, Retina - OU - Both Eyes    prednisoLONE acetate (PRED FORTE) 1 % ophthalmic suspension    3. Essential hypertension  I10     4. Hypertensive retinopathy of both eyes  H35.033     5. Combined forms of age-related cataract of both eyes  H25.813      1,2. Severe proliferative diabetic retinopathy, both eyes  - A1c >15% on 10.11.22 - The incidence, risk factors for progression, natural history and treatment options for diabetic retinopathy were discussed with patient.   - The need for close monitoring of blood glucose, blood pressure, and serum lipids, avoiding cigarette or any type of tobacco, and the need for long term follow up was also discussed with patient. - exam shows extensive neovascularization, fibrosis and hemorrhage OU - FA (11.09.22) shows areas of vascular non-perfusion OU, +NVD OU and +NVE OU - OCT shows diabetic macular edema, both eyes  - discussed findings, severity of his disease, prognosis and likely need for extensive treatments -- PRP OU, multiple rounds of anti-VEGF therapy OU and possible surgery - recommend PRP OS today, 11.10.22 - Risks and benefits of tx discussed w/pt - Consent form signed - See procedure note - start PF QID OS x7 days - F/u Monday for PRP OD  3,4. Hypertensive retinopathy OU - discussed importance of tight BP control - monitor  5. Mixed Cataract OU - The symptoms of cataract, surgical options, and treatments and risks were discussed with patient. - discussed diagnosis and progression - not yet visually significant - monitor for now  Ophthalmic Meds Ordered this visit:  Meds ordered this encounter  Medications   prednisoLONE acetate  (PRED FORTE) 1 % ophthalmic suspension    Sig: Place 1 drop into the left eye 4 (four) times daily for 7 days.    Dispense:  10 mL    Refill:  0      Return in 4 days (on 11/14/2020) for Laser PRP OD.  There are no Patient Instructions on file for this visit.  Explained the diagnoses, plan, and follow up with the patient and they expressed understanding.  Patient expressed understanding of the importance of proper follow up care.   This document serves as a record of services personally performed by [name], MD, PhD. It was created on their behalf by Estill Bakes, COT an ophthalmic technician. The creation of this record is the provider's dictation and/or activities during the visit.    Electronically signed by: Estill Bakes, COT 11.10.22 @ 11:36 AM   [name], M.D., Ph.D. Diseases & Surgery of the Retina and [place] 11.10.22  I have reviewed the above documentation for accuracy and completeness, and I agree with the above. [name], M.D., Ph.D. 11/10/20 11:36 AM  Abbreviations: M myopia (nearsighted); A astigmatism;  H hyperopia (farsighted); P presbyopia; Mrx spectacle prescription;  CTL contact lenses; OD right eye; OS left eye; OU both eyes  XT exotropia; ET esotropia; PEK punctate epithelial keratitis; PEE punctate epithelial erosions; DES dry eye syndrome; MGD meibomian gland dysfunction; ATs artificial tears; PFAT's preservative free artificial tears; [place] nuclear sclerotic cataract; PSC posterior subcapsular cataract; ERM epi-retinal membrane; PVD posterior vitreous detachment; RD retinal detachment; DM diabetes mellitus; DR diabetic retinopathy; NPDR non-proliferative diabetic retinopathy; PDR proliferative diabetic retinopathy; CSME clinically significant macular edema; DME diabetic macular edema; dbh dot blot hemorrhages; CWS cotton wool spot; POAG primary open angle glaucoma; C/D cup-to-disc ratio; HVF humphrey visual  field; GVF goldmann visual field; OCT optical coherence tomography; IOP intraocular pressure; BRVO Branch retinal vein occlusion; CRVO central retinal vein occlusion; CRAO central retinal artery occlusion; BRAO branch retinal artery occlusion; RT retinal tear; SB scleral buckle; PPV pars plana vitrectomy; VH Vitreous hemorrhage; PRP panretinal laser photocoagulation; IVK intravitreal kenalog; VMT vitreomacular traction; MH Macular hole;  NVD neovascularization of the disc; NVE neovascularization elsewhere; AREDS age related eye disease study; ARMD age related macular degeneration; POAG primary open angle glaucoma; EBMD epithelial/anterior basement membrane dystrophy; ACIOL anterior chamber intraocular lens; IOL intraocular lens; PCIOL posterior chamber intraocular lens; Phaco/IOL phacoemulsification with intraocular lens placement; [place] photorefractive keratectomy; LASIK laser assisted in situ keratomileusis; HTN hypertension; DM diabetes mellitus; COPD chronic obstructive pulmonary disease

## 2020-11-11 ENCOUNTER — Ambulatory Visit: Payer: HMO | Admitting: Endocrinology

## 2020-11-14 ENCOUNTER — Encounter (INDEPENDENT_AMBULATORY_CARE_PROVIDER_SITE_OTHER): Payer: Self-pay | Admitting: Ophthalmology

## 2020-11-14 ENCOUNTER — Encounter (HOSPITAL_COMMUNITY): Payer: HMO

## 2020-11-14 ENCOUNTER — Other Ambulatory Visit: Payer: Self-pay

## 2020-11-14 ENCOUNTER — Encounter (INDEPENDENT_AMBULATORY_CARE_PROVIDER_SITE_OTHER): Payer: HMO | Admitting: Ophthalmology

## 2020-11-14 ENCOUNTER — Encounter: Payer: HMO | Admitting: Surgery

## 2020-11-14 ENCOUNTER — Encounter (INDEPENDENT_AMBULATORY_CARE_PROVIDER_SITE_OTHER): Payer: Self-pay

## 2020-11-14 NOTE — Progress Notes (Signed)
This encounter was created in error - please disregard.

## 2020-11-14 NOTE — Progress Notes (Signed)
**Pt left before completing imaging and before being seen by MD -- pt was having musculoskeletal pain / discomfort and wanted to reschedule the appointment**   [facility] Note  11/14/2020     CHIEF COMPLAINT Patient presents for No chief complaint on file.  HISTORY OF PRESENT ILLNESS: [name] [name] is a 65 y.o. male who presents to the clinic today for:    Referring physician: Janith Lima, MD [place],  [place] [address]  HISTORICAL INFORMATION:   Selected notes from the MEDICAL RECORD NUMBER Referred by Dr. [name] for concern of PDR OU   CURRENT MEDICATIONS: Current Outpatient Medications (Ophthalmic Drugs)  Medication Sig   prednisoLONE acetate (PRED FORTE) 1 % ophthalmic suspension Place 1 drop into the left eye 4 (four) times daily for 7 days.   No current facility-administered medications for this visit. (Ophthalmic Drugs)   Current Outpatient Medications (Other)  Medication Sig   apixaban (ELIQUIS) 5 MG TABS tablet Take 1 tablet (5 mg total) by mouth 2 (two) times daily.   aspirin EC 325 MG tablet Take 325 mg by mouth daily.   atorvastatin (LIPITOR) 40 MG tablet Take 1 tablet (40 mg total) by mouth daily.   B Complex Vitamins (B COMPLEX-B12 PO) Take 1 tablet by mouth daily.    BD PEN NEEDLE NANO 2ND GEN 32G X 4 MM MISC USE FOR INSULIN   Blood Glucose Monitoring Suppl (ONETOUCH VERIO FLEX SYSTEM) w/Device KIT    cephALEXin (KEFLEX) 500 MG capsule    Cholecalciferol (VITAMIN D) 125 MCG (5000 UT) CAPS Take 5,000 Units by mouth daily.    ciprofloxacin (CIPRO) 500 MG tablet Take 500 mg by mouth 2 (two) times daily.   Continuous Blood Gluc Sensor (DEXCOM G6 SENSOR) MISC 1 Device by Does not apply route See admin instructions. Change every 10 days   cyclobenzaprine (FLEXERIL) 5 MG tablet    HYDROcodone-acetaminophen (NORCO/VICODIN) 5-325 MG tablet    hydrOXYzine (ATARAX/VISTARIL) 10 MG tablet    Insulin Aspart  FlexPen (NOVOLOG) 100 UNIT/ML Inject 4 Units into the skin 2 (two) times daily with a meal. And pen needles 3/day   Insulin Disposable Pump (OMNIPOD 5 G6 INTRO, GEN 5,) KIT 1 Device by Does not apply route every 3 (three) days.   insulin glargine (LANTUS SOLOSTAR) 100 UNIT/ML Solostar Pen Inject 30 Units into the skin every morning.   Lancets (ONETOUCH DELICA PLUS [id number]) MISC Apply 1 each topically 4 (four) times daily.   lisinopril (ZESTRIL) 5 MG tablet TAKE 1 TABLET BY MOUTH DAILY   mirtazapine (REMERON) 15 MG tablet Take 1 tablet (15 mg total) by mouth at bedtime.   ONETOUCH VERIO test strip 1 each 4 (four) times daily.   PARoxetine (PAXIL) 20 MG tablet Take 1 tablet (20 mg total) by mouth daily.   SANTYL ointment Apply 1 application topically daily.   traZODone (DESYREL) 50 MG tablet Take 1 tablet (50 mg total) by mouth at bedtime.   No current facility-administered medications for this visit. (Other)   REVIEW OF SYSTEMS:   ALLERGIES No Known Allergies  PAST MEDICAL HISTORY Past Medical History:  Diagnosis Date   Anxiety    Arthritis    Cataract    Constipation    pt states goes EOD- hard stools    Coronary artery disease    Depression    Diabetes mellitus without complication ([place])    Diabetic retinopathy ([place])    DKA (diabetic  ketoacidoses)    Hyperlipidemia    Hypertension    off meds   Hypertensive retinopathy    Psoriasis    Past Surgical History:  Procedure Laterality Date   ABDOMINAL AORTOGRAM W/LOWER EXTREMITY N/A 08/30/2020   Procedure: ABDOMINAL AORTOGRAM W/LOWER EXTREMITY;  Surgeon: Serafina Mitchell, MD;  Location: [facility];  Service: Cardiovascular;  Laterality: N/A;   ANKLE SURGERY     right   CARDIAC CATHETERIZATION  09/25/2001   COLONOSCOPY     ~10 yr ago- normal  per pt    CORONARY ARTERY BYPASS GRAFT  09/30/2001   CABG x 4   IR THORACENTESIS ASP PLEURAL SPACE W/IMG GUIDE  05/19/2020   LAPAROSCOPIC INGUINAL HERNIA REPAIR Bilateral  02/09/2010   MASS EXCISION Left 03/12/2013   Procedure: LEFT INDEX EXCISION MASS AND DEBRIDMENT DISTAL INTERPHALANGEAL JOINT;  Surgeon: Tennis Must, MD;  Location: [facility];  Service: Orthopedics;  Laterality: Left;   PERIPHERAL VASCULAR BALLOON ANGIOPLASTY Left 08/30/2020   Procedure: PERIPHERAL VASCULAR BALLOON ANGIOPLASTY;  Surgeon: Serafina Mitchell, MD;  Location: [facility];  Service: Cardiovascular;  Laterality: Left;  Peroneal    FAMILY HISTORY Family History  Problem Relation Age of Onset   Lymphoma Father    Cancer Paternal Grandmother        Colon Cancer   Colon cancer Paternal Grandmother    Colon polyps Neg Hx    Esophageal cancer Neg Hx    Rectal cancer Neg Hx    Stomach cancer Neg Hx    Diabetes Mellitus I Neg Hx     SOCIAL HISTORY Social History   Tobacco Use   Smoking status: Some Days    Types: Cigars   Smokeless tobacco: Never  Vaping Use   Vaping Use: Never used  Substance Use Topics   Alcohol use: Yes    Comment: daily beer or scotch    Drug use: No       OPHTHALMIC EXAM: Not recorded     IMAGING AND PROCEDURES  Imaging and Procedures for 11/14/2020          ASSESSMENT/PLAN:  No diagnosis found.  1,2. Severe proliferative diabetic retinopathy, both eyes  - A1c >15% on 10.11.22 - The incidence, risk factors for progression, natural history and treatment options for diabetic retinopathy were discussed with patient.   - The need for close monitoring of blood glucose, blood pressure, and serum lipids, avoiding cigarette or any type of tobacco, and the need for long term follow up was also discussed with patient. - exam shows extensive neovascularization, fibrosis and hemorrhage OU - FA (11.09.22) shows areas of vascular non-perfusion OU, +NVD OU and +NVE OU - OCT shows diabetic macular edema, both eyes  - discussed findings, severity of his disease, prognosis and likely need for extensive treatments -- PRP OU,  multiple rounds of anti-VEGF therapy OU and possible surgery - PRP OS 11.10.22 - Recommend PRP OD today, 11.14.22 - Risks and benefits of tx discussed w/pt - Consent form signed - See procedure note - continue PF QID OS x 2 more days - Begin PF QID OD x 7 days - F/u   3,4. Hypertensive retinopathy OU - discussed importance of tight BP control - monitor  5. Mixed Cataract OU - The symptoms of cataract, surgical options, and treatments and risks were discussed with patient. - discussed diagnosis and progression - not yet visually significant - monitor for now  Ophthalmic Meds Ordered this visit:  No  orders of the defined types were placed in this encounter.     No follow-ups on file.  There are no Patient Instructions on file for this visit.  Explained the diagnoses, plan, and follow up with the patient and they expressed understanding.  Patient expressed understanding of the importance of proper follow up care.   This document serves as a record of services personally performed by [name], MD, PhD. It was created on their behalf by Estill Bakes, COT an ophthalmic technician. The creation of this record is the provider's dictation and/or activities during the visit.    Electronically signed by: Estill Bakes, COT 11.14.22 @ 10:00 AM   [name], M.D., Ph.D. Diseases & Surgery of the Retina and [place] 11.14.22  Abbreviations: M myopia (nearsighted); A astigmatism; H hyperopia (farsighted); P presbyopia; Mrx spectacle prescription;  CTL contact lenses; OD right eye; OS left eye; OU both eyes  XT exotropia; ET esotropia; PEK punctate epithelial keratitis; PEE punctate epithelial erosions; DES dry eye syndrome; MGD meibomian gland dysfunction; ATs artificial tears; PFAT's preservative free artificial tears; [place] nuclear sclerotic cataract; PSC posterior subcapsular cataract; ERM epi-retinal membrane; PVD posterior vitreous  detachment; RD retinal detachment; DM diabetes mellitus; DR diabetic retinopathy; NPDR non-proliferative diabetic retinopathy; PDR proliferative diabetic retinopathy; CSME clinically significant macular edema; DME diabetic macular edema; dbh dot blot hemorrhages; CWS cotton wool spot; POAG primary open angle glaucoma; C/D cup-to-disc ratio; HVF humphrey visual field; GVF goldmann visual field; OCT optical coherence tomography; IOP intraocular pressure; BRVO Branch retinal vein occlusion; CRVO central retinal vein occlusion; CRAO central retinal artery occlusion; BRAO branch retinal artery occlusion; RT retinal tear; SB scleral buckle; PPV pars plana vitrectomy; VH Vitreous hemorrhage; PRP panretinal laser photocoagulation; IVK intravitreal kenalog; VMT vitreomacular traction; MH Macular hole;  NVD neovascularization of the disc; NVE neovascularization elsewhere; AREDS age related eye disease study; ARMD age related macular degeneration; POAG primary open angle glaucoma; EBMD epithelial/anterior basement membrane dystrophy; ACIOL anterior chamber intraocular lens; IOL intraocular lens; PCIOL posterior chamber intraocular lens; Phaco/IOL phacoemulsification with intraocular lens placement; [place] photorefractive keratectomy; LASIK laser assisted in situ keratomileusis; HTN hypertension; DM diabetes mellitus; COPD chronic obstructive pulmonary disease

## 2020-11-15 ENCOUNTER — Ambulatory Visit (HOSPITAL_COMMUNITY)
Admission: RE | Admit: 2020-11-15 | Discharge: 2020-11-15 | Disposition: A | Discharge status: Home / Self Care | Payer: HMO | Attending: Surgery | Admitting: Surgery

## 2020-11-15 ENCOUNTER — Telehealth: Payer: Self-pay | Admitting: Endocrinology

## 2020-11-15 ENCOUNTER — Encounter (HOSPITAL_COMMUNITY)
Admission: RE | Disposition: A | Discharge status: Home / Self Care | Payer: Self-pay | Source: Home / Self Care | Attending: Surgery

## 2020-11-15 ENCOUNTER — Other Ambulatory Visit: Payer: Self-pay

## 2020-11-15 DIAGNOSIS — I714 Abdominal aortic aneurysm, without rupture, unspecified: Secondary | ICD-10-CM | POA: Insufficient documentation

## 2020-11-15 DIAGNOSIS — L97819 Non-pressure chronic ulcer of other part of right lower leg with unspecified severity: Secondary | ICD-10-CM | POA: Insufficient documentation

## 2020-11-15 DIAGNOSIS — E1151 Type 2 diabetes mellitus with diabetic peripheral angiopathy without gangrene: Secondary | ICD-10-CM | POA: Insufficient documentation

## 2020-11-15 DIAGNOSIS — I70244 Atherosclerosis of native arteries of left leg with ulceration of heel and midfoot: Secondary | ICD-10-CM | POA: Diagnosis not present

## 2020-11-15 DIAGNOSIS — E11622 Type 2 diabetes mellitus with other skin ulcer: Secondary | ICD-10-CM | POA: Insufficient documentation

## 2020-11-15 DIAGNOSIS — I70238 Atherosclerosis of native arteries of right leg with ulceration of other part of lower right leg: Secondary | ICD-10-CM | POA: Insufficient documentation

## 2020-11-15 DIAGNOSIS — E11621 Type 2 diabetes mellitus with foot ulcer: Secondary | ICD-10-CM | POA: Diagnosis not present

## 2020-11-15 DIAGNOSIS — L97429 Non-pressure chronic ulcer of left heel and midfoot with unspecified severity: Secondary | ICD-10-CM | POA: Insufficient documentation

## 2020-11-15 DIAGNOSIS — I70235 Atherosclerosis of native arteries of right leg with ulceration of other part of foot: Secondary | ICD-10-CM | POA: Diagnosis not present

## 2020-11-15 HISTORY — PX: ABDOMINAL AORTOGRAM W/LOWER EXTREMITY: CATH118223

## 2020-11-15 LAB — POCT I-STAT, CHEM 8
BUN: 10 mg/dL (ref 8–23)
Calcium, Ion: 1.22 mmol/L (ref 1.15–1.40)
Chloride: 94 mmol/L — ABNORMAL LOW (ref 98–111)
Creatinine, Ser: 0.4 mg/dL — ABNORMAL LOW (ref 0.61–1.24)
Glucose, Bld: 329 mg/dL — ABNORMAL HIGH (ref 70–99)
HCT: 42 % (ref 39.0–52.0)
Hemoglobin: 14.3 g/dL (ref 13.0–17.0)
Potassium: 3.7 mmol/L (ref 3.5–5.1)
Sodium: 136 mmol/L (ref 135–145)
TCO2: 30 mmol/L (ref 22–32)

## 2020-11-15 LAB — GLUCOSE, CAPILLARY: Glucose-Capillary: 264 mg/dL — ABNORMAL HIGH (ref 70–99)

## 2020-11-15 SURGERY — ABDOMINAL AORTOGRAM W/LOWER EXTREMITY
Anesthesia: LOCAL

## 2020-11-15 MED ORDER — SODIUM CHLORIDE 0.9 % IV SOLN: INTRAVENOUS | Status: DC

## 2020-11-15 MED ORDER — HEPARIN (PORCINE) IN NACL 1000-0.9 UT/500ML-% IV SOLN
INTRAVENOUS | Status: AC
  Filled 2020-11-15: qty 500

## 2020-11-15 MED ORDER — SODIUM CHLORIDE 0.9% FLUSH: 3.0000 mL | INTRAVENOUS | Status: DC | PRN

## 2020-11-15 MED ORDER — SODIUM CHLORIDE 0.9 % WEIGHT BASED INFUSION: 1.0000 mL/kg/h | INTRAVENOUS | Status: DC

## 2020-11-15 MED ORDER — MIDAZOLAM HCL 2 MG/2ML IJ SOLN
INTRAMUSCULAR | Status: AC
  Filled 2020-11-15: qty 2

## 2020-11-15 MED ORDER — IODIXANOL 320 MG/ML IV SOLN
INTRAVENOUS | Status: DC | PRN
  Administered 2020-11-15: 150 mL via INTRA_ARTERIAL

## 2020-11-15 MED ORDER — FENTANYL CITRATE (PF) 100 MCG/2ML IJ SOLN
INTRAMUSCULAR | Status: DC | PRN
  Administered 2020-11-15: 25 ug via INTRAVENOUS
  Administered 2020-11-15: 50 ug via INTRAVENOUS

## 2020-11-15 MED ORDER — FENTANYL CITRATE (PF) 100 MCG/2ML IJ SOLN
INTRAMUSCULAR | Status: AC
  Filled 2020-11-15: qty 2

## 2020-11-15 MED ORDER — SODIUM CHLORIDE 0.9% FLUSH: 3.0000 mL | Freq: Two times a day (BID) | INTRAVENOUS | Status: DC

## 2020-11-15 MED ORDER — LIDOCAINE HCL (PF) 1 % IJ SOLN
INTRAMUSCULAR | Status: DC | PRN
  Administered 2020-11-15: 20 mL

## 2020-11-15 MED ORDER — LABETALOL HCL 5 MG/ML IV SOLN: 10.0000 mg | INTRAVENOUS | Status: DC | PRN

## 2020-11-15 MED ORDER — HYDRALAZINE HCL 20 MG/ML IJ SOLN: 5.0000 mg | INTRAMUSCULAR | Status: DC | PRN

## 2020-11-15 MED ORDER — ONDANSETRON HCL 4 MG/2ML IJ SOLN: 4.0000 mg | Freq: Four times a day (QID) | INTRAMUSCULAR | Status: DC | PRN

## 2020-11-15 MED ORDER — HEPARIN SODIUM (PORCINE) 1000 UNIT/ML IJ SOLN
INTRAMUSCULAR | Status: DC | PRN
  Administered 2020-11-15: 7000 [IU] via INTRAVENOUS

## 2020-11-15 MED ORDER — HEPARIN (PORCINE) IN NACL 1000-0.9 UT/500ML-% IV SOLN
INTRAVENOUS | Status: DC | PRN
  Administered 2020-11-15 (×2): 500 mL

## 2020-11-15 MED ORDER — MIDAZOLAM HCL 2 MG/2ML IJ SOLN
INTRAMUSCULAR | Status: DC | PRN
  Administered 2020-11-15: 1 mg via INTRAVENOUS
  Administered 2020-11-15: 2 mg via INTRAVENOUS

## 2020-11-15 MED ORDER — SODIUM CHLORIDE 0.9 % IV SOLN: 250.0000 mL | INTRAVENOUS | Status: DC | PRN

## 2020-11-15 MED ORDER — LIDOCAINE HCL (PF) 1 % IJ SOLN
INTRAMUSCULAR | Status: AC
  Filled 2020-11-15: qty 30

## 2020-11-15 MED ORDER — ACETAMINOPHEN 325 MG PO TABS: 650.0000 mg | ORAL_TABLET | ORAL | Status: DC | PRN

## 2020-11-15 SURGICAL SUPPLY — 17 items
BAG SNAP BAND KOVER 36X36 (MISCELLANEOUS) ×4 IMPLANT
BALLN STERLING OTW 3X220X150 (BALLOONS) ×3
BALLOON STERLING OTW 3X220X150 (BALLOONS) IMPLANT
CATH OMNI FLUSH 5F 65CM (CATHETERS) ×2 IMPLANT
DEVICE VASC CLSR CELT ART 5 (Vascular Products) ×2 IMPLANT
GUIDEWIRE ANGLED .035X150CM (WIRE) ×2 IMPLANT
KIT ENCORE 26 ADVANTAGE (KITS) ×2 IMPLANT
KIT MICROPUNCTURE NIT STIFF (SHEATH) ×4 IMPLANT
KIT PV (KITS) ×3 IMPLANT
SHEATH FLEX ANSEL ANG 5F 45CM (SHEATH) ×2 IMPLANT
SHEATH PINNACLE 5F 10CM (SHEATH) ×2 IMPLANT
SHEATH PROBE COVER 6X72 (BAG) ×2 IMPLANT
SYR MEDRAD MARK 7 150ML (SYRINGE) ×3 IMPLANT
TRANSDUCER W/STOPCOCK (MISCELLANEOUS) ×3 IMPLANT
TRAY PV CATH (CUSTOM PROCEDURE TRAY) ×3 IMPLANT
WIRE BENTSON .035X145CM (WIRE) ×2 IMPLANT
WIRE G V18X300CM (WIRE) ×2 IMPLANT

## 2020-11-15 NOTE — Interval H&P Note (Signed)
History and Physical Interval Note:  11/15/2020 11:44 AM  [name]  has presented today for surgery, with the diagnosis of foot ulcers.  The various methods of treatment have been discussed with the patient and family. After consideration of risks, benefits and other options for treatment, the patient has consented to  Procedure(s): ABDOMINAL AORTOGRAM W/LOWER EXTREMITY (N/A) as a surgical intervention.  The patient's history has been reviewed, patient examined, no change in status, stable for surgery.  I have reviewed the patient's chart and labs.  Questions were answered to the patient's satisfaction.     [name] [name]

## 2020-11-15 NOTE — Telephone Encounter (Signed)
[employer] needs a PA form faxed back for Intro Kit Omnipod 5  Can fax back to [employer] [phone]   Or to ins company

## 2020-11-15 NOTE — Discharge Instructions (Signed)
Restart Eliquis tomorrow, 11-16-2020

## 2020-11-15 NOTE — Op Note (Signed)
    Patient name: Triston Skare MRN: [id number] DOB: 18-Jan-1955 Sex: male  11/15/2020 Pre-operative Diagnosis: Right leg ulcer Post-operative diagnosis:  Same Surgeon:  Annamarie Major Procedure Performed:  1.  Ultrasound-guided access, left femoral artery  2.  Abdominal aortogram  3.  Bilateral lower extremity runoff  4.  Balloon angioplasty right posterior tibial artery  5.  Conscious sedation, 63 minutes  6.  Closure device, Celt    Indications: This is a 65 year old gentleman who was previously undergone angioplasty of his left peroneal artery for wound.  He has a new wound on his right foot he comes in today for further evaluation  Procedure:  The patient was identified in the holding area and taken to room 8.  The patient was then placed supine on the table and prepped and draped in the usual sterile fashion.  A time out was called.  Conscious sedation was administered with the use of IV fentanyl and Versed under continuous physician and nurse monitoring.  Heart rate, blood pressure, and oxygen saturation were continuously monitored.  Total sedation time was 63 minutes.  Ultrasound was used to evaluate the left common femoral artery.  It was patent .  A digital ultrasound image was acquired.  A micropuncture needle was used to access the left common femoral artery under ultrasound guidance.  An 018 wire was advanced without resistance and a micropuncture sheath was placed.  The 018 wire was removed and a benson wire was placed.  The micropuncture sheath was exchanged for a 5 french sheath.  An omniflush catheter was advanced over the wire to the level of L-1.  An abdominal angiogram was obtained.  Next, using the omniflush catheter and a benson wire, the aortic bifurcation was crossed and the catheter was placed into theright external iliac artery and right runoff was obtained.  left runoff was performed via retrograde sheath injections.  Findings:   Aortogram: No significant renal  artery stenosis was identified.  The infrarenal abdominal aorta is widely patent.  Bilateral common and external iliac arteries are widely patent.  Right Lower Extremity: Right common femoral profundofemoral and superficial femoral artery are widely patent.  The popliteal artery is widely patent.  The dominant runoff is the posterior tibial artery which has several areas of greater than 80% stenosis.  Left Lower Extremity: Left common femoral, profundofemoral, and superficial femoral artery are widely patent.  Popliteal artery is widely patent.  There is recurrent stenosis greater than 50% within the peroneal artery which is the single-vessel runoff  Intervention: After above images were acquired the decision made to proceed with intervention.  A 5 5 French 45 cm sheath was advanced into the right external iliac artery.  The patient was fully heparinized.  Next a V-18 wire was directed into the posterior tibial artery and primary balloon angioplasty was performed of the peroneal artery using a 3 x 220 Sterling balloon.  Completion imaging revealed resolution of the stenosis.  This point catheters and wires were removed.  The groin was closed with a Celt.  Impression:  #1  High-grade right posterior tibial artery stenosis successfully treated using a 3 mm balloon  #2  Recurrent stenosis within the left peroneal artery.  If the patient continues to have difficulty healing his wounds, we could consider repeat balloon angioplasty.   Theotis Burrow, M.D., [facility] [place] Office: [phone] Pager:  [phone]

## 2020-11-16 ENCOUNTER — Other Ambulatory Visit (HOSPITAL_COMMUNITY): Payer: Self-pay

## 2020-11-16 ENCOUNTER — Encounter (HOSPITAL_COMMUNITY): Payer: Self-pay | Admitting: Surgery

## 2020-11-17 ENCOUNTER — Encounter (HOSPITAL_BASED_OUTPATIENT_CLINIC_OR_DEPARTMENT_OTHER): Payer: HMO | Admitting: Internal Medicine

## 2020-11-17 ENCOUNTER — Other Ambulatory Visit: Payer: Self-pay

## 2020-11-17 DIAGNOSIS — L97511 Non-pressure chronic ulcer of other part of right foot limited to breakdown of skin: Secondary | ICD-10-CM | POA: Diagnosis not present

## 2020-11-17 DIAGNOSIS — E11621 Type 2 diabetes mellitus with foot ulcer: Secondary | ICD-10-CM | POA: Diagnosis not present

## 2020-11-17 DIAGNOSIS — L97512 Non-pressure chronic ulcer of other part of right foot with fat layer exposed: Secondary | ICD-10-CM | POA: Diagnosis not present

## 2020-11-17 NOTE — Progress Notes (Signed)
[name] [name], [name] [name] ([id number]) Visit Report for 11/17/2020 Arrival Information Details Patient Name: Date of Service: [name] [name] [facility] THA N L. 11/17/2020 8:00 A M Medical Record Number: [id number] Patient Account Number: [id number] Date of Birth/Sex: Treating RN: 10-May-1955 (65 y.o. [name] [name], [name] [name] Primary Care [name] [name]: [name] [name] Other Clinician: Referring Yalexa Blust: Treating [name] [name]/Extender: [name] [name] in Treatment: 91 Visit Information History Since Last Visit All ordered tests and consults were completed: Yes Patient Arrived: Wheel Chair Added or deleted any medications: No Arrival Time: 07:50 Any new allergies or adverse reactions: No Accompanied By: mother Had a fall or experienced change in No Transfer Assistance: Manual activities of daily living that may affect Patient Identification Verified: Yes risk of falls: Secondary Verification Process Completed: Yes Signs or symptoms of abuse/neglect since last visito No Patient Requires Transmission-Based Precautions: No Hospitalized since last visit: No Patient Has Alerts: Yes Implantable device outside of the clinic excluding No Patient Alerts: Patient on Blood Thinner cellular tissue based products placed in the center since last visit: Has Dressing in Place as Prescribed: Yes Pain Present Now: No Notes right leg Angiogram 11/15/2020 with [facility] Electronic Signature(s) Signed: 11/17/2020 5:18:51 PM By: [name] Pilling RN, BSN Entered By: [name] [name] on 11/17/2020 07:59:31 -------------------------------------------------------------------------------- Encounter Discharge Information Details Patient Name: Date of Service: [name] [name] [facility] THA N L. 11/17/2020 8:00 A M Medical Record Number: [id number] Patient Account Number: [id number] Date of Birth/Sex: Treating RN: 06/10/1955 (65 y.o. [name] [name] Primary Care [name] [name]: [name] [name] Other Clinician: Referring  [name] [name]: Treating [name] [name]: [name] [name] in Treatment: 19 Encounter Discharge Information Items Post Procedure Vitals Discharge Condition: Stable Temperature (F): 98.4 Ambulatory Status: Wheelchair Pulse (bpm): 74 Discharge Destination: Home Respiratory Rate (breaths/min): 16 Transportation: Private Auto Blood Pressure (mmHg): 127/81 Accompanied By: mother Schedule Follow-up Appointment: Yes Clinical Summary of Care: Electronic Signature(s) Signed: 11/17/2020 5:18:51 PM By: [name] Pilling RN, BSN Entered By: [name] [name] on 11/17/2020 08:17:19 -------------------------------------------------------------------------------- Lower Extremity Assessment Details Patient Name: Date of Service: [name] [name] [facility] THA N L. 11/17/2020 8:00 A M Medical Record Number: [id number] Patient Account Number: [id number] Date of Birth/Sex: Treating RN: 08/31/1955 (65 y.o. [name] [name] Primary Care [name] [name]: [name] [name] Other Clinician: Referring [name] [name]: Treating [name] [name]/Extender: [name] [name] in Treatment: 19 Edema Assessment Assessed: [name] [name]: Yes] [name] [name]: Yes] Edema: [Left: Yes] [Right: No] Calf Left: Right: Point of Measurement: 34 cm From Medial Instep 31 cm 30 cm Ankle Left: Right: Point of Measurement: 10 cm From Medial Instep 24 cm 22 cm Vascular Assessment Pulses: Dorsalis Pedis Palpable: [Left:Yes] [Right:Yes] Electronic Signature(s) Signed: 11/17/2020 5:18:51 PM By: [name] Pilling RN, BSN Entered By: [name] [name] on 11/17/2020 08:01:06 -------------------------------------------------------------------------------- Multi Wound Chart Details Patient Name: Date of Service: [name] [name] [facility] THA N L. 11/17/2020 8:00 A M Medical Record Number: [id number] Patient Account Number: [id number] Date of Birth/Sex: Treating RN: [birth year] (65 y.o. [name] [name] Primary Care Makynzi Eastland: [name] [name] Other  Clinician: Referring Ardit Danh: Treating [name] [name]/Extender: [name] [name] in Treatment: 19 Vital Signs Height(in): 47 Pulse(bpm): 28 Weight(lbs): 151 Blood Pressure(mmHg): 127/81 Body Mass Index(BMI): 19 Temperature(F): 98.4 Respiratory Rate(breaths/min): 16 Photos: [1:Left Calcaneus] [3:Right, Lateral Foot] [N/A:N/A N/A] Wound Location: [1:Pressure Injury] [3:Gradually Appeared] [N/A:N/A] Wounding Event: [1:Pressure Ulcer] [3:Diabetic Wound/Ulcer of the Lower] [N/A:N/A] Primary Etiology: [1:Coronary Artery Disease,] [3:Extremity Coronary Artery Disease,] [N/A:N/A] Comorbid History: [1:Hypertension, Type II Diabetes, Osteoarthritis, Neuropathy 05/23/2020] [3:Hypertension, Type II Diabetes, Osteoarthritis, Neuropathy 06/21/2020] [N/A:N/A] Date  Acquired: [1:19] [3:19] [N/A:N/A] Weeks of Treatment: [1:Open] [3:Open] [N/A:N/A] Wound Status: [1:0.7x1.3x0.2] [3:1.4x1.4x0.4] [N/A:N/A] Measurements L x W x D (cm) [1:0.715] [3:1.539] [N/A:N/A] A (cm) : rea [1:0.143] [3:0.616] [N/A:N/A] Volume (cm) : [1:94.30%] [3:23.20%] [N/A:N/A] % Reduction in A [1:rea: 94.30%] [3:-53.60%] [N/A:N/A] % Reduction in Volume: [1:4] Starting Position 1 (o'clock): [1:8] Ending Position 1 (o'clock): [1:0.3] Maximum Distance 1 (cm): [1:Yes] [3:No] [N/A:N/A] Undermining: [1:Category/Stage IV] [3:Grade 2] [N/A:N/A] Classification: [1:Medium] [3:Medium] [N/A:N/A] Exudate A mount: [1:Serosanguineous] [3:Serosanguineous] [N/A:N/A] Exudate Type: [1:red, brown] [3:red, brown] [N/A:N/A] Exudate Color: [1:Distinct, outline attached] [3:Well defined, not attached] [N/A:N/A] Wound Margin: [1:Medium (34-66%)] [3:Small (1-33%)] [N/A:N/A] Granulation A mount: [1:Pink, Hyper-granulation, Friable] [3:Pink] [N/A:N/A] Granulation Quality: [1:Medium (34-66%)] [3:Large (67-100%)] [N/A:N/A] Necrotic A mount: [1:Fat Layer (Subcutaneous Tissue): Yes Fat Layer (Subcutaneous Tissue): Yes N/A] Exposed  Structures: [1:Fascia: No Tendon: No Muscle: No Joint: No Bone: No Large (67-100%)] [3:Fascia: No Tendon: No Muscle: No Joint: No Bone: No Small (1-33%)] [N/A:N/A] Epithelialization: [1:N/A] [3:Debridement - Excisional] [N/A:N/A] Debridement: Pre-procedure Verification/Time Out N/A [3:08:10] [N/A:N/A] Taken: [1:N/A] [3:Other] [N/A:N/A] Pain Control: [1:N/A] [3:Necrotic/Eschar, Subcutaneous,] [N/A:N/A] Tissue Debrided: [1:N/A] [3:Slough Skin/Subcutaneous Tissue] [N/A:N/A] Level: [1:N/A] [3:2.25] [N/A:N/A] Debridement A (sq cm): [1:rea N/A] [3:Curette] [N/A:N/A] Instrument: [1:N/A] [3:Minimum] [N/A:N/A] Bleeding: [1:N/A] [3:Pressure] [N/A:N/A] Hemostasis Achieved: [1:N/A] [3:0] [N/A:N/A] Procedural Pain: [1:N/A] [3:0] [N/A:N/A] Post Procedural Pain: [1:N/A] [3:Procedure was tolerated well] [N/A:N/A] Debridement Treatment Response: [1:N/A] [3:1.4x1.4x0.4] [N/A:N/A] Post Debridement Measurements L x W x D (cm) [1:N/A] [3:0.616] [N/A:N/A] Post Debridement Volume: (cm) [1:N/A] [3:Debridement] [N/A:N/A] Treatment Notes Wound #1 (Calcaneus) Wound Laterality: Left Cleanser Soap and Water Discharge Instruction: May shower and wash wound with dial antibacterial soap and water prior to dressing change. Wound Cleanser Discharge Instruction: Cleanse the wound with wound cleanser prior to applying a clean dressing using gauze sponges, not tissue or cotton balls. Peri-Wound Care Topical Primary Dressing Hydrofera Blue Classic Foam, 2x2 in Discharge Instruction: Moisten with saline prior to applying to wound bed Santyl Ointment Discharge Instruction: Apply nickel thick amount to wound bed as instructed Secondary Dressing Woven Gauze Sponge, Non-Sterile 4x4 in Discharge Instruction: Apply over primary dressing as directed. ALLEVYN Heel 4 1/2in x 5 1/2in / 10.5cm x 13.5cm Discharge Instruction: Apply over primary dressing as directed. Secured With [employer], 4.5x3.1 (in/yd) Discharge  Instruction: Secure with Kerlix as directed. 75M Medipore H Soft Cloth Surgical T 4 x 2 (in/yd) ape Discharge Instruction: Secure dressing with tape as directed. Compression Wrap Compression Stockings Add-Ons Wound #3 (Foot) Wound Laterality: Right, Lateral Cleanser Soap and Water Discharge Instruction: May shower and wash wound with dial antibacterial soap and water prior to dressing change. Wound Cleanser Discharge Instruction: Cleanse the wound with wound cleanser prior to applying a clean dressing using gauze sponges, not tissue or cotton balls. Peri-Wound Care Topical Primary Dressing Hydrofera Blue Classic Foam, 4x4 in Discharge Instruction: Moisten with saline prior to applying to wound bed Santyl Ointment Discharge Instruction: Apply nickel thick amount to wound bed as instructed Secondary Dressing Woven Gauze Sponge, Non-Sterile 4x4 in Discharge Instruction: Apply over primary dressing as directed. Secured With [employer], 4.5x3.1 (in/yd) Discharge Instruction: Secure with Kerlix as directed. 75M Medipore H Soft Cloth Surgical T 4 x 2 (in/yd) ape Discharge Instruction: Secure dressing with tape as directed. Compression Wrap Compression Stockings Add-Ons Electronic Signature(s) Signed: 11/17/2020 5:16:34 PM By: Linton Ham MD Signed: 11/17/2020 5:18:51 PM By: [name] Pilling RN, BSN Entered By: Linton Ham on 11/17/2020 08:21:05 -------------------------------------------------------------------------------- Multi-Disciplinary Care Plan Details Patient Name: Date of Service:  [name] [name] [facility] THA N L. 11/17/2020 8:00 A M Medical Record Number: [id number] Patient Account Number: [id number] Date of Birth/Sex: Treating RN: 12-08-1955 (65 y.o. [name] [name], [name] Primary Care Amardeep Beckers: [name] [name] Other Clinician: Referring Ashwika Freels: Treating Emelin Dascenzo/Extender: [name] [name] in Treatment: [place] reviewed with  physician Active Inactive Necrotic Tissue Nursing Diagnoses: Impaired tissue integrity related to necrotic/devitalized tissue Knowledge deficit related to management of necrotic/devitalized tissue Goals: Necrotic/devitalized tissue will be minimized in the wound bed Date Initiated: 09/22/2020 Target Resolution Date: 12/02/2020 Goal Status: Active Patient/caregiver will verbalize understanding of reason and process for debridement of necrotic tissue Date Initiated: 09/22/2020 Date Inactivated: 10/06/2020 Target Resolution Date: 10/28/2020 Goal Status: Met Interventions: Provide education on necrotic tissue and debridement process Treatment Activities: Enzymatic debridement : 09/22/2020 Excisional debridement : 09/22/2020 Notes: Wound/Skin Impairment Nursing Diagnoses: Knowledge deficit related to ulceration/compromised skin integrity Goals: Patient/caregiver will verbalize understanding of skin care regimen Date Initiated: 07/07/2020 Target Resolution Date: 12/30/2020 Goal Status: Active Interventions: Assess patient/caregiver ability to obtain necessary supplies Assess patient/caregiver ability to perform ulcer/skin care regimen upon admission and as needed Provide education on ulcer and skin care Treatment Activities: Skin care regimen initiated : 07/07/2020 Topical wound management initiated : 07/07/2020 Notes: Electronic Signature(s) Signed: 11/17/2020 5:18:51 PM By: [name] Pilling RN, BSN Entered By: [name] [name] on 11/17/2020 08:12:16 -------------------------------------------------------------------------------- Pain Assessment Details Patient Name: Date of Service: [name] [name] [facility] THA N L. 11/17/2020 8:00 A M Medical Record Number: [id number] Patient Account Number: [id number] Date of Birth/Sex: Treating RN: 1955-06-13 (65 y.o. [name] [name] Primary Care Biance Moncrief: [name] [name] Other Clinician: Referring Kylena Mole: Treating Julieana Eshleman/Extender: [name] [name] in Treatment: 19 Active Problems Location of Pain Severity and Description of Pain Patient Has Paino No Site Locations Rate the pain. Rate the pain. Current Pain Level: 0 Pain Management and Medication Current Pain Management: Medication: No Cold Application: No Rest: No Massage: No Activity: No T.E.N.S.: No Heat Application: No Leg drop or elevation: No Is the Current Pain Management Adequate: Adequate How does your wound impact your activities of daily livingo Sleep: No Bathing: No Appetite: No Relationship With Others: No Bladder Continence: No Emotions: No Bowel Continence: No Work: No Toileting: No Drive: No Dressing: No Hobbies: No Engineer, maintenance) Signed: 11/17/2020 5:18:51 PM By: [name] Pilling RN, BSN Entered By: [name] [name] on 11/17/2020 08:00:00 -------------------------------------------------------------------------------- Patient/Caregiver Education Details Patient Name: Date of Service: [name] [name] [facility] THA N L. 11/17/2022andnbsp8:00 A M Medical Record Number: [id number] Patient Account Number: [id number] Date of Birth/Gender: Treating RN: April 21, 1955 (65 y.o. [name] [name] Primary Care Physician: [name] [name] Other Clinician: Referring Physician: Treating Physician/Extender: [name] [name] in Treatment: 36 Education Assessment Education Provided To: Patient and Caregiver Education Topics Provided Wound/Skin Impairment: Handouts: Skin Care Do's and Dont's Methods: Explain/Verbal Responses: Reinforcements needed Electronic Signature(s) Signed: 11/17/2020 5:18:51 PM By: [name] Pilling RN, BSN Entered By: [name] [name] on 11/17/2020 08:12:33 -------------------------------------------------------------------------------- Wound Assessment Details Patient Name: Date of Service: [name] [name] [facility] THA N L. 11/17/2020 8:00 A M Medical Record Number: [id number] Patient Account Number: [id number] Date of  Birth/Sex: Treating RN: 06-18-1955 (65 y.o. [name] [name] Primary Care Shigeru Lampert: [name] [name] Other Clinician: Referring Shanna Strength: Treating Antrice Pal/Extender: [name] [name] in Treatment: 19 Wound Status Wound Number: 1 Primary Pressure Ulcer Etiology: Wound Location: Left Calcaneus Wound Open Wounding Event: Pressure Injury Status: Date Acquired: 05/23/2020 Comorbid Coronary Artery Disease, Hypertension, Type II Diabetes, Weeks  Of Treatment: 19 History: Osteoarthritis, Neuropathy Clustered Wound: No Photos Wound Measurements Length: (cm) 0.7 Width: (cm) 1.3 Depth: (cm) 0.2 Area: (cm) 0.715 Volume: (cm) 0.143 % Reduction in Area: 94.3% % Reduction in Volume: 94.3% Epithelialization: Large (67-100%) Tunneling: No Undermining: Yes Starting Position (o'clock): 4 Ending Position (o'clock): 8 Maximum Distance: (cm) 0.3 Wound Description Classification: Category/Stage IV Wound Margin: Distinct, outline attached Exudate Amount: Medium Exudate Type: Serosanguineous Exudate Color: red, brown Foul Odor After Cleansing: No Slough/Fibrino Yes Wound Bed Granulation Amount: Medium (34-66%) Exposed Structure Granulation Quality: Pink, Hyper-granulation, Friable Fascia Exposed: No Necrotic Amount: Medium (34-66%) Fat Layer (Subcutaneous Tissue) Exposed: Yes Necrotic Quality: Adherent Slough Tendon Exposed: No Muscle Exposed: No Joint Exposed: No Bone Exposed: No Treatment Notes Wound #1 (Calcaneus) Wound Laterality: Left Cleanser Soap and Water Discharge Instruction: May shower and wash wound with dial antibacterial soap and water prior to dressing change. Wound Cleanser Discharge Instruction: Cleanse the wound with wound cleanser prior to applying a clean dressing using gauze sponges, not tissue or cotton balls. Peri-Wound Care Topical Primary Dressing Hydrofera Blue Classic Foam, 2x2 in Discharge Instruction: Moisten with saline prior to  applying to wound bed Santyl Ointment Discharge Instruction: Apply nickel thick amount to wound bed as instructed Secondary Dressing Woven Gauze Sponge, Non-Sterile 4x4 in Discharge Instruction: Apply over primary dressing as directed. ALLEVYN Heel 4 1/2in x 5 1/2in / 10.5cm x 13.5cm Discharge Instruction: Apply over primary dressing as directed. Secured With [employer], 4.5x3.1 (in/yd) Discharge Instruction: Secure with Kerlix as directed. 24M Medipore H Soft Cloth Surgical T 4 x 2 (in/yd) ape Discharge Instruction: Secure dressing with tape as directed. Compression Wrap Compression Stockings Add-Ons Electronic Signature(s) Signed: 11/17/2020 4:04:47 PM By: Sandre Kitty Signed: 11/17/2020 5:18:51 PM By: [name] Pilling RN, BSN Entered By: Sandre Kitty on 11/17/2020 08:05:37 -------------------------------------------------------------------------------- Wound Assessment Details Patient Name: Date of Service: [name] [name] [facility] THA N L. 11/17/2020 8:00 A M Medical Record Number: [id number] Patient Account Number: [id number] Date of Birth/Sex: Treating RN: 1955/08/19 (65 y.o. [name] [name], [name] [name] Primary Care Ernesteen Mihalic: [name] [name] Other Clinician: Referring Ruthel Martine: Treating Jovonte Commins/Extender: [name] [name] in Treatment: 19 Wound Status Wound Number: 3 Primary Diabetic Wound/Ulcer of the Lower Extremity Etiology: Wound Location: Right, Lateral Foot Wound Open Wounding Event: Gradually Appeared Status: Date Acquired: 06/21/2020 Comorbid Coronary Artery Disease, Hypertension, Type II Diabetes, Weeks Of Treatment: 19 History: Osteoarthritis, Neuropathy Clustered Wound: No Photos Wound Measurements Length: (cm) 1.4 Width: (cm) 1.4 Depth: (cm) 0.4 Area: (cm) 1.539 Volume: (cm) 0.616 % Reduction in Area: 23.2% % Reduction in Volume: -53.6% Epithelialization: Small (1-33%) Tunneling: No Undermining: No Wound  Description Classification: Grade 2 Wound Margin: Well defined, not attached Exudate Amount: Medium Exudate Type: Serosanguineous Exudate Color: red, brown Foul Odor After Cleansing: No Slough/Fibrino Yes Wound Bed Granulation Amount: Small (1-33%) Exposed Structure Granulation Quality: Pink Fascia Exposed: No Necrotic Amount: Large (67-100%) Fat Layer (Subcutaneous Tissue) Exposed: Yes Necrotic Quality: Adherent Slough Tendon Exposed: No Muscle Exposed: No Joint Exposed: No Bone Exposed: No Treatment Notes Wound #3 (Foot) Wound Laterality: Right, Lateral Cleanser Soap and Water Discharge Instruction: May shower and wash wound with dial antibacterial soap and water prior to dressing change. Wound Cleanser Discharge Instruction: Cleanse the wound with wound cleanser prior to applying a clean dressing using gauze sponges, not tissue or cotton balls. Peri-Wound Care Topical Primary Dressing Hydrofera Blue Classic Foam, 4x4 in Discharge Instruction: Moisten with saline prior to applying to wound bed Santyl Ointment Discharge  Instruction: Apply nickel thick amount to wound bed as instructed Secondary Dressing Woven Gauze Sponge, Non-Sterile 4x4 in Discharge Instruction: Apply over primary dressing as directed. Secured With [employer], 4.5x3.1 (in/yd) Discharge Instruction: Secure with Kerlix as directed. 25M Medipore H Soft Cloth Surgical T 4 x 2 (in/yd) ape Discharge Instruction: Secure dressing with tape as directed. Compression Wrap Compression Stockings Add-Ons Electronic Signature(s) Signed: 11/17/2020 4:04:47 PM By: Sandre Kitty Signed: 11/17/2020 5:18:51 PM By: [name] Pilling RN, BSN Entered By: Sandre Kitty on 11/17/2020 08:06:02 -------------------------------------------------------------------------------- Vitals Details Patient Name: Date of Service: [name] [name] [facility] THA N L. 11/17/2020 8:00 A M Medical Record Number: [id number] Patient  Account Number: [id number] Date of Birth/Sex: Treating RN: 1955-12-13 (65 y.o. [name] [name], [name] Primary Care Nicklos Gaxiola: [name] [name] Other Clinician: Referring Amanda Pote: Treating Karyss Frese/Extender: [name] [name] in Treatment: 19 Vital Signs Time Taken: 07:50 Temperature (F): 98.4 Height (in): 74 Pulse (bpm): 74 Weight (lbs): 151 Respiratory Rate (breaths/min): 16 Body Mass Index (BMI): 19.4 Blood Pressure (mmHg): 127/81 Reference Range: 80 - 120 mg / dl Electronic Signature(s) Signed: 11/17/2020 5:18:51 PM By: [name] Pilling RN, BSN Entered By: [name] [name] on 11/17/2020 07:59:50

## 2020-11-17 NOTE — Progress Notes (Signed)
[name] [name], [name] [name] ([id number]) Visit Report for 11/17/2020 Debridement Details Patient Name: Date of Service: [name] [name] [facility] THA N L. 11/17/2020 8:00 A M Medical Record Number: [id number] Patient Account Number: [id number] Date of Birth/Sex: Treating RN: 1955/02/15 (65 y.o. [name] [name] Primary Care Provider: Scarlette [name] Other Clinician: Referring Provider: Treating Provider/Extender: [name] [name] in Treatment: 19 Debridement Performed for Assessment: Wound #3 Right,Lateral Foot Performed By: Physician [name] [name]., MD Debridement Type: Debridement Severity of Tissue Pre Debridement: Fat layer exposed Level of Consciousness (Pre-procedure): Awake and Alert Pre-procedure Verification/Time Out Yes - 08:10 Taken: Start Time: 08:11 Pain Control: Other : Benzocaine 20% T Area Debrided (L x W): otal 1.5 (cm) x 1.5 (cm) = 2.25 (cm) Tissue and other material debrided: Viable, Non-Viable, Eschar, Slough, Subcutaneous, Skin: Dermis , Skin: Epidermis, Fibrin/Exudate, Slough Level: Skin/Subcutaneous Tissue Debridement Description: Excisional Instrument: Curette Bleeding: Minimum Hemostasis Achieved: Pressure End Time: 08:15 Procedural Pain: 0 Post Procedural Pain: 0 Response to Treatment: Procedure was tolerated well Level of Consciousness (Post- Awake and Alert procedure): Post Debridement Measurements of Total Wound Length: (cm) 1.4 Width: (cm) 1.4 Depth: (cm) 0.4 Volume: (cm) 0.616 Character of Wound/Ulcer Post Debridement: Requires Further Debridement Severity of Tissue Post Debridement: Fat layer exposed Post Procedure Diagnosis Same as Pre-procedure Electronic Signature(s) Signed: 11/17/2020 5:16:34 PM By: [name] Ham MD Signed: 11/17/2020 5:18:51 PM By: [name] Pilling RN, BSN Entered By: [name] [name] on 11/17/2020 08:21:21 -------------------------------------------------------------------------------- HPI Details Patient  Name: Date of Service: [name] [name] [facility] THA N L. 11/17/2020 8:00 A M Medical Record Number: [id number] Patient Account Number: [id number] Date of Birth/Sex: Treating RN: August 15, 1955 (65 y.o. [name] [name] Primary Care Provider: Scarlette [name] Other Clinician: Referring Provider: Treating Provider/Extender: [name] [name] in Treatment: 40 History of Present Illness HPI Description: ADMISSION 07/07/2020 This is a 65 year old man who is a type II diabetic. I have reviewed this in epic. It appeared that his diabetes was diagnosed in 2009 at which time he was on metformin. He was followed for a period of time by an endocrinologist with [facility] to always labeled him as a type II diabetic. Recently in epic there is been a switch to type 1 diabetes. If there were defining antibody test done I do not see them and I think the totality of the evidence would suggest that he is a type II diabetic. His recent problem started in May when he fell down 17 stairs. He suffered a scapular fracture he was hospitalized at [facility] for 9 days and then discharged to [facility] His mother who is present said that most of these wounds seem to develop surrounding the discharge from hospital or the stay at [facility]. He has an area on his lower sacrum which appears currently to be a stage II but per their description this is actually improved quite a bit. He has an area on the left lateral foot at roughly the base of the fifth metatarsal, the left Achilles heel and the right fifth metatarsal head laterally they have been using topical antibiotics. Seen by primary care yesterday and placed on Keflex The patient had arterial studies on 05/05/2020. I wondered whether this had something to do with wounds on his feet at that time but I have not been able to determine this. At that time his ABI on the right was 1.09 with a TBI of 0.65 on the left 0.88 with a TBI of 0.44. Waveforms were triphasic on the  right  monophasic on the left. He likely has significant PAD on the left Past medical history includes type 2 diabetes poorly controlled last hemoglobin A1c at 12.7 in May, hypertension, hyperlipidemia, atrial fib on Eliquis, coronary artery disease status post CABG, left scapular fracture in May of this year, AAA repair. Bilateral leg weakness ABIs were not done in our clinic as they were just done in May of this year at vein [facility] vascular 7/14; patient arrives back in clinic he has areas on the left Achilles heel, base of the left fifth metatarsal, right lateral fifth metatarsal. The area he had over the coccyx appears to be healed but this area remains vulnerable. We have been using Iodoflex. I rereviewed his ABIs which were done in May. I do not know exactly what prompted these test however they do show a TBI of 0.44 on the left and monophasic waveforms. 7/21; the patient's wounds are about the same. Certainly no healing and the nurses noted some green drainage on the right fifth metatarsal head, left Achilles a little larger. We have been using silver alginate really without any improvement. We did get an appointment with vascular surgery however its that it did the end of August and they seem to want to repeat the noninvasive test that they have already done in May. I am not sure what the issue here is. We advised the patient's sister to call and point to the idea that they have already had noninvasive studies. The real question I have is does this patient need an angiogram. He is a diabetic and it is possible that the ABIs are falsely elevated because of medial calcification. At least on the left his TBI was abnormal 08/08/2020 upon evaluation today patient appears to be doing about the same in regard to his wounds. He still waiting the appointment with vascular. This is something that hopefully should be undertaken shortly. The 29th of this month is when he is actually scheduled although they  have put in a call to move this up if at all possible. Fortunately there does not appear to be any signs of active infection at this time. No fevers, chills, nausea, vomiting, or diarrhea. 8/18; appointment with Dr. Trula Slade on 8/29. The patient has wounds on the left Achilles heel, left lateral foot and the right lateral fifth metatarsal head. We have been using Santyl under Hydrofera Blue. The surface of especially the left heel looks better to me than what I remember. 9/1; since the patient was last here he underwent angiography by Dr. Trula Slade he also underwent noninvasive studies first showing a ABI of of 0.64 with monophasic waveforms. His TBI was 0.35 with a left great toe pressure of 46 this is about the same pattern as we saw previously. The left was a lot worse than the right. He underwent an angiogram on 08/29/2020. He had multiple 80% peroneal artery stenosis which was his dominant vessel which was treated with angioplasty with no residual stenosis. 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 The anterior tibial artery was also occluded. An attempt was made at recanalization of the anterior tibial artery however this was unsuccessful. Noted that the peroneal artery reconstitutes the posterior tibial artery and the anterior tibial artery creating the plantar arch. The patient has areas on the Achilles left heel left lateral foot and right lateral fifth metatarsal head we have been using Santyl and Hydrofera Blue 9/22; since the patient was last here he went for repeat vascular studies. On the right his ABI  was 0.94 with biphasic waveforms TBI of 0.73 with a great toe pressure of 75. On the left which is the problematic wound his ABI was 1.08 but with biphasic waveforms at the PTA. Biphasic waveforms at the dorsalis pedis his TBI was 0.63 with a great toe pressure of 65. On the left however this does represent a fairly marked improvement. We have been using Santyl and Hydrofera Blue 9/29  left Achilles area. Still is having necrotic surface. Bleeds very easily as the patient is on a combination of Eliquis aspirin and Plavix just stopped yesterday. I am not going to do any debridement until next week. We are using Santyl and Hydrofera Blue. His arterial status on the right was actually quite good. The area on the fifth metatarsal head needs additional debridement. A total contact cast in this area is not out of the question. He does describe claudication I believe with minimal activity. He is not very active X-ray we ordered last week is still pending in terms of the report 10/6 left Achilles area. In terms of the necrotic surface this is quite a bit better. There is still some debris in the posterior part of the wound however this looks better. Anteriorly the wound appears to be epithelialized there is still a divot in the middle of this but this does not probe to deep tissue. The area on the right fifth metatarsal head laterally really is not making as much progress as I would have liked. Still not completely a viable surface. Both the patient and his wife agree that he is not an external rotator in terms of ambulation [an outie]. We have been using Santyl and Hydrofera Blue for a prolonged period. On the left Achilles which looked like it is worse wound this seems to be making some progress not so much on the right foot. The patient and his wife state that they are rigorous about offloading both of these areas He was treated with angioplasty as I believe on the right peroneal artery that had multiple stenosis. This is a dominant vessel the anterior tibial and posterior tibial arteries are occluded however the foot is fed via collaterals from the peroneal. From my notes I am not able to understand the vascular supply on the left I will have to review this. 10/13; left Achilles top part of this looks healthy granulation in the bottom part of this wound again completely necrotic  requiring debridement. The area on the right lateral fifth met head also requires debridement. The majority of this is epithelialized however he she there is a refractory part to this. 10/20; left Achilles 100% better looking wound surface. Necrotic upper 50% seems to have resolved at least by inspection under illumination. Culture I did of this area last week postdebridement showed Proteus and Citrobacter. Both sensitive to quinolones and I put him on ciprofloxacin. We have been using silver alginate on the wound on the heel and Santyl and Hydrofera Blue on the right fifth met head. 10/27; left Achilles is measuring much smaller we are using topical gentamicin under silver alginate. Right fifth met head still fibrinous debris on the surface we are using Santyl and Hydrofera Blue. Patient lost his brother suddenly last week he was unable to keep his vascular surgery appointment. 11/3; left Achilles not as viable surface as I might like. Some hypergranulation. Right fifth metatarsal head still completely nonviable surface. It turns out that she was not using topical gentamicin on the left heel [mother] they have been  using Santyl and Hydrofera Blue on the right fifth metatarsal head. The patient did have noninvasive vascular studies on 10/ 28/22. He had triphasic waveforms up to the level of the proximal PTA and then a 50 to 74% stenosis in the mid PTA. Peroneal artery was triphasic as was the anterior tibial. He follows up with Dr. Trula Slade on 11/7. 11/17; since the patient was last here he saw Dr. Trula Slade and went for ANGIOGRAPHY on 11/15/2020. He has wounds on the left Achilles heel and the right lateral fifth metatarsal head. He has known PAD and had previous revascularization in August. On this occasion on the right his right common femoral, profundofemoral and superficial femoral and popliteal arteries were widely patent the dominant vessel is the posterior tibial which had several areas of  stenosis greater than 80% he went underwent angioplasty in this area. He had no interventions on the left however he is left common femoral, profundofemoral and superficial femoral arteries were widely patent as well as the popliteal he has recurrent stenosis greater than 50% within the peroneal artery which is a single- vessel runoff. The thought was that he may require further angiography and reangioplasty on the left peroneal artery. Overall the patient has severe PAD he has undergone angioplasty of the right posterior tibial artery. He arrives in clinic today with the Achilles heel area on the left surprisingly better with marked improvement in the surface area on the right not nearly as good but stable Electronic Signature(s) Signed: 11/17/2020 5:16:34 PM By: [name] Ham MD Entered By: [name] [name] on 11/17/2020 08:24:53 -------------------------------------------------------------------------------- Physical Exam Details Patient Name: Date of Service: [name] [name] [facility] THA N L. 11/17/2020 8:00 A M Medical Record Number: [id number] Patient Account Number: [id number] Date of Birth/Sex: Treating RN: 1955/12/25 (65 y.o. [name] [name] Primary Care Provider: Scarlette [name] Other Clinician: Referring Provider: Treating Provider/Extender: [name] [name] in Treatment: 19 Constitutional Sitting or standing Blood Pressure is within target range for patient.. Pulse regular and within target range for patient.Marland Kitchen Respirations regular, non-labored and within target range.. Temperature is normal and within the target range for the patient.Marland Kitchen Appears in no distress. Notes Wound exam; left Achilles much better than last week at which time he underwent a significant debridement. Small open area remains much of this wound is now epithelialized. This is a pleasing but somewhat surprising development On the right lateral certainly no improvement in surface area. I used a #3 curette  to remove slough skin and some slight subcutaneous tissue and eschar. This is stable and the debridement was easier Electronic Signature(s) Signed: 11/17/2020 5:16:34 PM By: [name] Ham MD Entered By: [name] [name] on 11/17/2020 08:25:57 -------------------------------------------------------------------------------- Physician Orders Details Patient Name: Date of Service: [name] [name] [facility] THA N L. 11/17/2020 8:00 A M Medical Record Number: [id number] Patient Account Number: [id number] Date of Birth/Sex: Treating RN: 06-02-1955 (65 y.o. [name], [name] Primary Care Provider: Scarlette [name] Other Clinician: Referring Provider: Treating Provider/Extender: [name] [name] in Treatment: [phone] Verbal / Phone Orders: No Diagnosis Coding ICD-10 Coding Code Description E11.621 Type 2 diabetes mellitus with foot ulcer E11.51 Type 2 diabetes mellitus with diabetic peripheral angiopathy without gangrene L97.528 Non-pressure chronic ulcer of other part of left foot with other specified severity L97.511 Non-pressure chronic ulcer of other part of right foot limited to breakdown of skin E11.42 Type 2 diabetes mellitus with diabetic polyneuropathy Follow-up Appointments ppointment in 2 weeks. - Dr. Dellia Nims Return A Bathing/ Shower/ Hygiene May shower  and wash wound with soap and water. - with dressing changes only. Edema Control - Lymphedema / SCD / Other Elevate legs to the level of the heart or above for 30 minutes daily and/or when sitting, a frequency of: - 3-4 times a day throughout the day. Moisturize legs daily. - every night before bed. Off-Loading Turn and reposition every 2 hours Other: - float heels while resting in bed or chair. Use a pillow or rolled sheet under legs to offload the heels. Home Health No change in wound care orders this week; continue Home Health for wound care. May utilize formulary equivalent dressing for wound treatment orders unless otherwise  specified. - continue same dressing. Other Home Health Orders/Instructions: - [facility] home health three times a week and family to change all other days. Wound Treatment Wound #1 - Calcaneus Wound Laterality: Left Cleanser: Soap and Water (Home Health) 1 x Per Day/30 Days Discharge Instructions: May shower and wash wound with dial antibacterial soap and water prior to dressing change. Cleanser: Wound Cleanser (Home Health) 1 x Per Day/30 Days Discharge Instructions: Cleanse the wound with wound cleanser prior to applying a clean dressing using gauze sponges, not tissue or cotton balls. Prim Dressing: Hydrofera Blue Classic Foam, 2x2 in 1 x Per Day/30 Days ary Discharge Instructions: Moisten with saline prior to applying to wound bed Prim Dressing: Santyl Ointment 1 x Per Day/30 Days ary Discharge Instructions: Apply nickel thick amount to wound bed as instructed Secondary Dressing: Woven Gauze Sponge, Non-Sterile 4x4 in (Home Health) 1 x Per Day/30 Days Discharge Instructions: Apply over primary dressing as directed. Secondary Dressing: ALLEVYN Heel 4 1/2in x 5 1/2in / 10.5cm x 13.5cm (Home Health) 1 x Per Day/30 Days Discharge Instructions: Apply over primary dressing as directed. Secured With: [employer], 4.5x3.1 (in/yd) (Home Health) 1 x Per Day/30 Days Discharge Instructions: Secure with Kerlix as directed. Secured With: 31M Medipore H Soft Cloth Surgical T 4 x 2 (in/yd) (Home Health) 1 x Per Day/30 Days ape Discharge Instructions: Secure dressing with tape as directed. Wound #3 - Foot Wound Laterality: Right, Lateral Cleanser: Soap and Water (Home Health) 1 x Per Day/30 Days Discharge Instructions: May shower and wash wound with dial antibacterial soap and water prior to dressing change. Cleanser: Wound Cleanser (Home Health) 1 x Per Day/30 Days Discharge Instructions: Cleanse the wound with wound cleanser prior to applying a clean dressing using gauze sponges, not tissue or  cotton balls. Prim Dressing: Hydrofera Blue Classic Foam, 4x4 in (Home Health) 1 x Per Day/30 Days ary Discharge Instructions: Moisten with saline prior to applying to wound bed Prim Dressing: Santyl Ointment ([place]) 1 x Per Day/30 Days ary Discharge Instructions: Apply nickel thick amount to wound bed as instructed Secondary Dressing: Woven Gauze Sponge, Non-Sterile 4x4 in (Home Health) 1 x Per Day/30 Days Discharge Instructions: Apply over primary dressing as directed. Secured With: [employer], 4.5x3.1 (in/yd) (Home Health) 1 x Per Day/30 Days Discharge Instructions: Secure with Kerlix as directed. Secured With: 31M Medipore H Soft Cloth Surgical T 4 x 2 (in/yd) (Home Health) 1 x Per Day/30 Days ape Discharge Instructions: Secure dressing with tape as directed. Electronic Signature(s) Signed: 11/17/2020 5:16:34 PM By: [name] Ham MD Signed: 11/17/2020 5:18:51 PM By: [name] Pilling RN, BSN Entered By: [name] [name] on 11/17/2020 08:14:52 -------------------------------------------------------------------------------- Problem List Details Patient Name: Date of Service: [name] [name] [facility] THA N L. 11/17/2020 8:00 A M Medical Record Number: [id number] Patient Account Number: [id number] Date  of Birth/Sex: Treating RN: 08-04-1955 (65 y.o. [name], [name] Primary Care Provider: Scarlette [name] Other Clinician: Referring Provider: Treating Provider/Extender: [name] [name] in Treatment: 19 Active Problems ICD-10 Encounter Code Description Active Date MDM Diagnosis E11.621 Type 2 diabetes mellitus with foot ulcer 07/07/2020 No Yes E11.51 Type 2 diabetes mellitus with diabetic peripheral angiopathy without gangrene 09/01/2020 No Yes L97.528 Non-pressure chronic ulcer of other part of left foot with other specified 07/07/2020 No Yes severity L97.511 Non-pressure chronic ulcer of other part of right foot limited to breakdown of 07/07/2020 No Yes skin E11.42  Type 2 diabetes mellitus with diabetic polyneuropathy 07/07/2020 No Yes Inactive Problems ICD-10 Code Description Active Date Inactive Date L89.152 Pressure ulcer of sacral region, stage 2 07/07/2020 07/07/2020 Z32.992 Pressure ulcer of left heel, stage 2 07/07/2020 07/07/2020 Resolved Problems Electronic Signature(s) Signed: 11/17/2020 5:16:34 PM By: [name] Ham MD Entered By: [name] [name] on 11/17/2020 08:20:56 -------------------------------------------------------------------------------- Progress Note Details Patient Name: Date of Service: [name] [name] [facility] THA N L. 11/17/2020 8:00 A M Medical Record Number: [id number] Patient Account Number: [id number] Date of Birth/Sex: Treating RN: Mar 06, 1955 (65 y.o. [name] [name] Primary Care Provider: Scarlette [name] Other Clinician: Referring Provider: Treating Provider/Extender: [name] [name] in Treatment: 19 Subjective History of Present Illness (HPI) ADMISSION 07/07/2020 This is a 65 year old man who is a type II diabetic. I have reviewed this in epic. It appeared that his diabetes was diagnosed in 2009 at which time he was on metformin. He was followed for a period of time by an endocrinologist with [facility] to always labeled him as a type II diabetic. Recently in epic there is been a switch to type 1 diabetes. If there were defining antibody test done I do not see them and I think the totality of the evidence would suggest that he is a type II diabetic. His recent problem started in May when he fell down 17 stairs. He suffered a scapular fracture he was hospitalized at [facility] for 9 days and then discharged to [facility]. His mother who is present said that most of these wounds seem to develop surrounding the discharge from hospital or the stay at [facility] He has an area on his lower sacrum which appears currently to be a stage II but per their description this is actually improved quite a bit. He has an area on the  left lateral foot at roughly the base of the fifth metatarsal, the left Achilles heel and the right fifth metatarsal head laterally they have been using topical antibiotics. Seen by primary care yesterday and placed on Keflex The patient had arterial studies on 05/05/2020. I wondered whether this had something to do with wounds on his feet at that time but I have not been able to determine this. At that time his ABI on the right was 1.09 with a TBI of 0.65 on the left 0.88 with a TBI of 0.44. Waveforms were triphasic on the right monophasic on the left. He likely has significant PAD on the left Past medical history includes type 2 diabetes poorly controlled last hemoglobin A1c at 12.7 in May, hypertension, hyperlipidemia, atrial fib on Eliquis, coronary artery disease status post CABG, left scapular fracture in May of this year, AAA repair. Bilateral leg weakness ABIs were not done in our clinic as they were just done in May of this year at [facility] vascular 7/14; patient arrives back in clinic he has areas on the left Achilles heel, base of  the left fifth metatarsal, right lateral fifth metatarsal. The area he had over the coccyx appears to be healed but this area remains vulnerable. We have been using Iodoflex. I rereviewed his ABIs which were done in May. I do not know exactly what prompted these test however they do show a TBI of 0.44 on the left and monophasic waveforms. 7/21; the patient's wounds are about the same. Certainly no healing and the nurses noted some green drainage on the right fifth metatarsal head, left Achilles a little larger. We have been using silver alginate really without any improvement. We did get an appointment with vascular surgery however its that it did the end of August and they seem to want to repeat the noninvasive test that they have already done in May. I am not sure what the issue here is. We advised the patient's sister to call and point to the idea that they have  already had noninvasive studies. The real question I have is does this patient need an angiogram. He is a diabetic and it is possible that the ABIs are falsely elevated because of medial calcification. At least on the left his TBI was abnormal 08/08/2020 upon evaluation today patient appears to be doing about the same in regard to his wounds. He still waiting the appointment with vascular. This is something that hopefully should be undertaken shortly. The 29th of this month is when he is actually scheduled although they have put in a call to move this up if at all possible. Fortunately there does not appear to be any signs of active infection at this time. No fevers, chills, nausea, vomiting, or diarrhea. 8/18; appointment with Dr. Trula Slade on 8/29. The patient has wounds on the left Achilles heel, left lateral foot and the right lateral fifth metatarsal head. We have been using Santyl under Hydrofera Blue. The surface of especially the left heel looks better to me than what I remember. 9/1; since the patient was last here he underwent angiography by Dr. Trula Slade he also underwent noninvasive studies first showing a ABI of of 0.64 with monophasic waveforms. His TBI was 0.35 with a left great toe pressure of 46 this is about the same pattern as we saw previously. The left was a lot worse than the right. He underwent an angiogram on 08/29/2020. He had multiple 80% peroneal artery stenosis which was his dominant vessel which was treated with angioplasty with no residual stenosis. 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 The anterior tibial artery was also occluded. An attempt was made at recanalization of the anterior tibial artery however this was unsuccessful. Noted that the peroneal artery reconstitutes the posterior tibial artery and the anterior tibial artery creating the plantar arch. The patient has areas on the Achilles left heel left lateral foot and right lateral fifth metatarsal head we have  been using Santyl and Hydrofera Blue 9/22; since the patient was last here he went for repeat vascular studies. On the right his ABI was 0.94 with biphasic waveforms TBI of 0.73 with a great toe pressure of 75. On the left which is the problematic wound his ABI was 1.08 but with biphasic waveforms at the PTA. Biphasic waveforms at the dorsalis pedis his TBI was 0.63 with a great toe pressure of 65. On the left however this does represent a fairly marked improvement. We have been using Santyl and Hydrofera Blue 9/29 left Achilles area. Still is having necrotic surface. Bleeds very easily as the patient is on a combination  of Eliquis aspirin and Plavix just stopped yesterday. I am not going to do any debridement until next week. We are using Santyl and Hydrofera Blue. His arterial status on the right was actually quite good. The area on the fifth metatarsal head needs additional debridement. A total contact cast in this area is not out of the question. He does describe claudication I believe with minimal activity. He is not very active X-ray we ordered last week is still pending in terms of the report 10/6 left Achilles area. In terms of the necrotic surface this is quite a bit better. There is still some debris in the posterior part of the wound however this looks better. Anteriorly the wound appears to be epithelialized there is still a divot in the middle of this but this does not probe to deep tissue. The area on the right fifth metatarsal head laterally really is not making as much progress as I would have liked. Still not completely a viable surface. Both the patient and his wife agree that he is not an external rotator in terms of ambulation [an outie]. We have been using Santyl and Hydrofera Blue for a prolonged period. On the left Achilles which looked like it is worse wound this seems to be making some progress not so much on the right foot. The patient and his wife state that they  are rigorous about offloading both of these areas He was treated with angioplasty as I believe on the right peroneal artery that had multiple stenosis. This is a dominant vessel the anterior tibial and posterior tibial arteries are occluded however the foot is fed via collaterals from the peroneal. From my notes I am not able to understand the vascular supply on the left I will have to review this. 10/13; left Achilles top part of this looks healthy granulation in the bottom part of this wound again completely necrotic requiring debridement. oo The area on the right lateral fifth met head also requires debridement. The majority of this is epithelialized however he she there is a refractory part to this. 10/20; left Achilles 100% better looking wound surface. Necrotic upper 50% seems to have resolved at least by inspection under illumination. Culture I did of this area last week postdebridement showed Proteus and Citrobacter. Both sensitive to quinolones and I put him on ciprofloxacin. We have been using silver alginate on the wound on the heel and Santyl and Hydrofera Blue on the right fifth met head. 10/27; left Achilles is measuring much smaller we are using topical gentamicin under silver alginate. Right fifth met head still fibrinous debris on the surface we are using Santyl and Hydrofera Blue. Patient lost his brother suddenly last week he was unable to keep his vascular surgery appointment. 11/3; left Achilles not as viable surface as I might like. Some hypergranulation. Right fifth metatarsal head still completely nonviable surface. It turns out that she was not using topical gentamicin on the left heel [mother] they have been using Santyl and Hydrofera Blue on the right fifth metatarsal head. The patient did have noninvasive vascular studies on 10/ 28/22. He had triphasic waveforms up to the level of the proximal PTA and then a 50 to 74% stenosis in the mid PTA. Peroneal artery was triphasic  as was the anterior tibial. He follows up with Dr. Trula Slade on 11/7. 11/17; since the patient was last here he saw Dr. Trula Slade and went for ANGIOGRAPHY on 11/15/2020. He has wounds on the left Achilles heel and the  right lateral fifth metatarsal head. He has known PAD and had previous revascularization in August. On this occasion on the right his right common femoral, profundofemoral and superficial femoral and popliteal arteries were widely patent the dominant vessel is the posterior tibial which had several areas of stenosis greater than 80% he went underwent angioplasty in this area. He had no interventions on the left however he is left common femoral, profundofemoral and superficial femoral arteries were widely patent as well as the popliteal he has recurrent stenosis greater than 50% within the peroneal artery which is a single- vessel runoff. The thought was that he may require further angiography and reangioplasty on the left peroneal artery. Overall the patient has severe PAD he has undergone angioplasty of the right posterior tibial artery. He arrives in clinic today with the Achilles heel area on the left surprisingly better with marked improvement in the surface area on the right not nearly as good but stable Objective Constitutional Sitting or standing Blood Pressure is within target range for patient.. Pulse regular and within target range for patient.Marland Kitchen Respirations regular, non-labored and within target range.. Temperature is normal and within the target range for the patient.Marland Kitchen Appears in no distress. Vitals Time Taken: 7:50 AM, Height: 74 in, Weight: 151 lbs, BMI: 19.4, Temperature: 98.4 F, Pulse: 74 bpm, Respiratory Rate: 16 breaths/min, Blood Pressure: 127/81 mmHg. General Notes: Wound exam; left Achilles much better than last week at which time he underwent a significant debridement. Small open area remains much of this wound is now epithelialized. This is a pleasing but somewhat  surprising development oo On the right lateral certainly no improvement in surface area. I used a #3 curette to remove slough skin and some slight subcutaneous tissue and eschar. This is stable and the debridement was easier Integumentary (Hair, Skin) Wound #1 status is Open. Original cause of wound was Pressure Injury. The date acquired was: 05/23/2020. The wound has been in treatment 19 weeks. The wound is located on the Left Calcaneus. The wound measures 0.7cm length x 1.3cm width x 0.2cm depth; 0.715cm^2 area and 0.143cm^3 volume. There is Fat Layer (Subcutaneous Tissue) exposed. There is no tunneling noted, however, there is undermining starting at 4:00 and ending at 8:00 with a maximum distance of 0.3cm. There is a medium amount of serosanguineous drainage noted. The wound margin is distinct with the outline attached to the wound base. There is medium (34-66%) pink, friable, hyper - granulation within the wound bed. There is a medium (34-66%) amount of necrotic tissue within the wound bed including Adherent Slough. Wound #3 status is Open. Original cause of wound was Gradually Appeared. The date acquired was: 06/21/2020. The wound has been in treatment 19 weeks. The wound is located on the Right,Lateral Foot. The wound measures 1.4cm length x 1.4cm width x 0.4cm depth; 1.539cm^2 area and 0.616cm^3 volume. There is Fat Layer (Subcutaneous Tissue) exposed. There is no tunneling or undermining noted. There is a medium amount of serosanguineous drainage noted. The wound margin is well defined and not attached to the wound base. There is small (1-33%) pink granulation within the wound bed. There is a large (67-100%) amount of necrotic tissue within the wound bed including Adherent Slough. 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 There was a Excisional Skin/Subcutaneous Tissue Debridement with a total area of 2.25 sq cm performed by [name] [name]., MD. With the following instrument(s): Curette to remove Viable and Non-Viable tissue/material. Material removed includes Eschar, Subcutaneous Tissue, Slough, Skin: Dermis, Skin: Epidermis, and Fibrin/Exudate after achieving pain control using Other (Benzocaine 20%). A time out was conducted at 08:10, prior to the start of the procedure. A Minimum amount of bleeding was controlled with Pressure. The procedure was tolerated well with a pain level of 0 throughout and a pain level of 0 following the procedure. Post Debridement Measurements: 1.4cm length x 1.4cm width x 0.4cm depth; 0.616cm^3 volume. Character of Wound/Ulcer Post Debridement requires further debridement. Severity of Tissue Post Debridement is: Fat layer exposed. Post procedure Diagnosis Wound #3: Same as Pre-Procedure Plan Follow-up Appointments: Return Appointment in 2 weeks. - Dr. Dellia Nims Bathing/ Shower/ Hygiene: May shower and wash wound with soap and water. - with dressing changes only. Edema Control - Lymphedema / SCD / Other: Elevate legs to the level of the heart or above for 30 minutes daily and/or when sitting, a frequency of: - 3-4 times a day throughout the day. Moisturize legs daily. - every night before bed. Off-Loading: Turn and reposition every 2 hours Other: - float heels while resting in bed or chair. Use a pillow or rolled sheet under legs to offload the heels. Home Health: No change in wound care orders this week; continue Home Health  for wound care. May utilize formulary equivalent dressing for wound treatment orders unless otherwise specified. - continue same dressing. Other Home Health Orders/Instructions: - [facility] home health three times a week and family to change all other days. WOUND #1: - Calcaneus Wound Laterality: Left Cleanser: Soap and Water (Home Health) 1 x Per Day/30 Days Discharge Instructions: May shower and wash wound with dial antibacterial soap and water prior to dressing change. Cleanser: Wound Cleanser (Home Health) 1 x Per Day/30 Days Discharge Instructions: Cleanse the wound with wound cleanser prior to applying a clean dressing using gauze sponges, not tissue or cotton balls. Prim Dressing: Hydrofera Blue Classic Foam, 2x2 in 1 x Per Day/30 Days ary Discharge Instructions: Moisten with saline prior to applying to wound bed Prim Dressing: Santyl Ointment 1 x Per Day/30 Days ary Discharge Instructions: Apply nickel thick amount to wound bed as instructed Secondary Dressing: Woven Gauze Sponge, Non-Sterile 4x4 in (Home Health) 1 x Per Day/30 Days Discharge Instructions: Apply over primary dressing as directed. Secondary Dressing: ALLEVYN Heel 4 1/2in x 5 1/2in / 10.5cm x 13.5cm (Home Health) 1 x Per Day/30 Days Discharge Instructions: Apply over primary dressing as directed. Secured With: [employer], 4.5x3.1 (in/yd) (Home Health) 1 x Per Day/30 Days Discharge Instructions: Secure with Kerlix as directed. Secured With: 12M Medipore H Soft Cloth Surgical T 4 x 2 (in/yd) (Home Health) 1 x Per Day/30 Days ape Discharge Instructions: Secure dressing with tape as directed. WOUND #3: - Foot Wound Laterality: Right, Lateral Cleanser: Soap and Water (Home Health) 1 x Per Day/30 Days Discharge Instructions: May shower and wash wound with dial antibacterial soap and water prior to dressing change. Cleanser: Wound Cleanser (Home Health) 1 x Per Day/30 Days Discharge Instructions: Cleanse the wound  with wound cleanser prior to applying a clean dressing  using gauze sponges, not tissue or cotton balls. Prim Dressing: Hydrofera Blue Classic Foam, 4x4 in (Home Health) 1 x Per Day/30 Days ary Discharge Instructions: Moisten with saline prior to applying to wound bed Prim Dressing: Santyl Ointment ([place]) 1 x Per Day/30 Days ary Discharge Instructions: Apply nickel thick amount to wound bed as instructed Secondary Dressing: Woven Gauze Sponge, Non-Sterile 4x4 in (Home Health) 1 x Per Day/30 Days Discharge Instructions: Apply over primary dressing as directed. Secured With: [employer], 4.5x3.1 (in/yd) (Home Health) 1 x Per Day/30 Days Discharge Instructions: Secure with Kerlix as directed. Secured With: 29M Medipore H Soft Cloth Surgical T 4 x 2 (in/yd) (Home Health) 1 x Per Day/30 Days ape Discharge Instructions: Secure dressing with tape as directed. 1. We continued with Santyl and Hydrofera Blue to both areas 2. Both patient's wounds despite especially on the left which was the area that was not revascularized on this occasion is a lot better. I did a reasonably extensive debridement last week which seems to have helped. 3. The area on the right is actually on the lateral foot. This was stable. I did a debridement things look slightly better after debridement and I am used to seeing. 4. Follow-up in 2 weeks. Electronic Signature(s) Signed: 11/17/2020 5:16:34 PM By: [name] Ham MD Entered By: [name] [name] on 11/17/2020 08:27:33 -------------------------------------------------------------------------------- SuperBill Details Patient Name: Date of Service: [name] [name] HNA THA N L. 11/17/2020 Medical Record Number: [id number] Patient Account Number: [id number] Date of Birth/Sex: Treating RN: 1955/08/08 (65 y.o. [name], [name] Primary Care Provider: Scarlette [name] Other Clinician: Referring Provider: Treating Provider/Extender: [name] [name] in  Treatment: 19 Diagnosis Coding ICD-10 Codes Code Description E11.621 Type 2 diabetes mellitus with foot ulcer E11.51 Type 2 diabetes mellitus with diabetic peripheral angiopathy without gangrene L97.528 Non-pressure chronic ulcer of other part of left foot with other specified severity L97.511 Non-pressure chronic ulcer of other part of right foot limited to breakdown of skin E11.42 Type 2 diabetes mellitus with diabetic polyneuropathy Facility Procedures CPT4 Code: [id number] Description: 47829 - DEB SUBQ TISSUE 20 SQ CM/< ICD-10 Diagnosis Description L97.511 Non-pressure chronic ulcer of other part of right foot limited to breakdown of Modifier: skin Quantity: 1 Physician Procedures : CPT4 Code Description Modifier [id number] - WC PHYS SUBQ TISS 20 SQ CM ICD-10 Diagnosis Description L97.511 Non-pressure chronic ulcer of other part of right foot limited to breakdown of skin Quantity: 1 Electronic Signature(s) Signed: 11/17/2020 5:16:34 PM By: [name] Ham MD Entered By: [name] [name] on 11/17/2020 08:28:06

## 2020-11-20 ENCOUNTER — Other Ambulatory Visit: Payer: Self-pay

## 2020-11-20 DIAGNOSIS — I7025 Atherosclerosis of native arteries of other extremities with ulceration: Secondary | ICD-10-CM

## 2020-11-25 DIAGNOSIS — L89150 Pressure ulcer of sacral region, unstageable: Secondary | ICD-10-CM | POA: Diagnosis not present

## 2020-11-25 DIAGNOSIS — S42102D Fracture of unspecified part of scapula, left shoulder, subsequent encounter for fracture with routine healing: Secondary | ICD-10-CM | POA: Diagnosis not present

## 2020-11-29 NOTE — Progress Notes (Signed)
[facility] Note  12/02/2020     CHIEF COMPLAINT Patient presents for Retina Follow Up  HISTORY OF PRESENT ILLNESS: [name] [name] is a 65 y.o. male who presents to the clinic today for:   HPI     Retina Follow Up   Patient presents with  Diabetic Retinopathy.  In both eyes.  Severity is severe.  I, the attending physician,  performed the HPI with the patient and updated documentation appropriately.        Comments   Patient here for 3 weeks retina follow up for PDR OU. Patient states vision is terrible. Cant see anything. Everything is blurry OU. No eye pain.       Last edited by [name], MD on 12/02/2020 11:50 AM.     Patient states vision still blurry OU.  Referring physician: Janith Lima, MD [place],  [place] [address]  HISTORICAL INFORMATION:   Selected notes from the MEDICAL RECORD NUMBER Referred by Dr. [name] for concern of PDR OU   CURRENT MEDICATIONS: No current outpatient medications on file. (Ophthalmic Drugs)   No current facility-administered medications for this visit. (Ophthalmic Drugs)   Current Outpatient Medications (Other)  Medication Sig   apixaban (ELIQUIS) 5 MG TABS tablet Take 1 tablet (5 mg total) by mouth 2 (two) times daily.   aspirin EC 325 MG tablet Take 325 mg by mouth daily.   atorvastatin (LIPITOR) 40 MG tablet Take 1 tablet (40 mg total) by mouth daily.   B Complex Vitamins (B COMPLEX-B12 PO) Take 1 tablet by mouth daily.    BD PEN NEEDLE NANO 2ND GEN 32G X 4 MM MISC USE FOR INSULIN   Blood Glucose Monitoring Suppl (ONETOUCH VERIO FLEX SYSTEM) w/Device KIT    cephALEXin (KEFLEX) 500 MG capsule    Cholecalciferol (VITAMIN D) 125 MCG (5000 UT) CAPS Take 5,000 Units by mouth daily.    ciprofloxacin (CIPRO) 500 MG tablet Take 500 mg by mouth 2 (two) times daily.   Continuous Blood Gluc Sensor (DEXCOM G6 SENSOR) MISC 1 Device by Does not apply route See admin  instructions. Change every 10 days   cyclobenzaprine (FLEXERIL) 5 MG tablet    HYDROcodone-acetaminophen (NORCO/VICODIN) 5-325 MG tablet    hydrOXYzine (ATARAX/VISTARIL) 10 MG tablet    Insulin Aspart FlexPen (NOVOLOG) 100 UNIT/ML Inject 4 Units into the skin 2 (two) times daily with a meal. And pen needles 3/day   Insulin Disposable Pump (OMNIPOD 5 G6 INTRO, GEN 5,) KIT 1 Device by Does not apply route every 3 (three) days.   insulin glargine (LANTUS SOLOSTAR) 100 UNIT/ML Solostar Pen Inject 30 Units into the skin every morning.   Lancets (ONETOUCH DELICA PLUS KYHCWC37S) MISC Apply 1 each topically 4 (four) times daily.   lisinopril (ZESTRIL) 5 MG tablet TAKE 1 TABLET BY MOUTH DAILY   mirtazapine (REMERON) 15 MG tablet Take 1 tablet (15 mg total) by mouth at bedtime.   ONETOUCH VERIO test strip 1 each 4 (four) times daily.   PARoxetine (PAXIL) 20 MG tablet Take 1 tablet (20 mg total) by mouth daily.   SANTYL ointment Apply 1 application topically daily.   traZODone (DESYREL) 50 MG tablet Take 1 tablet (50 mg total) by mouth at bedtime.   No current facility-administered medications for this visit. (Other)   REVIEW OF SYSTEMS: ROS   Positive for: Neurological, Skin, Endocrine, Cardiovascular, Eyes Negative for: Constitutional, Gastrointestinal, Genitourinary, Musculoskeletal, HENT, Respiratory, Psychiatric, Allergic/Imm,  Heme/Lymph Last edited by Theodore Demark, COA on 12/02/2020  9:44 AM.      ALLERGIES No Known Allergies  PAST MEDICAL HISTORY Past Medical History:  Diagnosis Date   Anxiety    Arthritis    Cataract    Constipation    pt states goes EOD- hard stools    Coronary artery disease    Depression    Diabetes mellitus without complication (HCC)    Diabetic retinopathy ([place])    DKA (diabetic ketoacidoses)    Hyperlipidemia    Hypertension    off meds   Hypertensive retinopathy    Psoriasis    Past Surgical History:  Procedure Laterality Date   ABDOMINAL  AORTOGRAM W/LOWER EXTREMITY N/A 08/30/2020   Procedure: ABDOMINAL AORTOGRAM W/LOWER EXTREMITY;  Surgeon: Serafina Mitchell, MD;  Location: [facility];  Service: Cardiovascular;  Laterality: N/A;   ABDOMINAL AORTOGRAM W/LOWER EXTREMITY N/A 11/15/2020   Procedure: ABDOMINAL AORTOGRAM W/LOWER EXTREMITY;  Surgeon: Serafina Mitchell, MD;  Location: [facility];  Service: Cardiovascular;  Laterality: N/A;   ANKLE SURGERY     right   CARDIAC CATHETERIZATION  09/25/2001   COLONOSCOPY     ~10 yr ago- normal  per pt    CORONARY ARTERY BYPASS GRAFT  09/30/2001   CABG x 4   IR THORACENTESIS ASP PLEURAL SPACE W/IMG GUIDE  05/19/2020   LAPAROSCOPIC INGUINAL HERNIA REPAIR Bilateral 02/09/2010   MASS EXCISION Left 03/12/2013   Procedure: LEFT INDEX EXCISION MASS AND DEBRIDMENT DISTAL INTERPHALANGEAL JOINT;  Surgeon: Tennis Must, MD;  Location: [facility];  Service: Orthopedics;  Laterality: Left;   PERIPHERAL VASCULAR BALLOON ANGIOPLASTY Left 08/30/2020   Procedure: PERIPHERAL VASCULAR BALLOON ANGIOPLASTY;  Surgeon: Serafina Mitchell, MD;  Location: [facility];  Service: Cardiovascular;  Laterality: Left;  Peroneal    FAMILY HISTORY Family History  Problem Relation Age of Onset   Lymphoma Father    Cancer Paternal Grandmother        Colon Cancer   Colon cancer Paternal Grandmother    Colon polyps Neg Hx    Esophageal cancer Neg Hx    Rectal cancer Neg Hx    Stomach cancer Neg Hx    Diabetes Mellitus I Neg Hx     SOCIAL HISTORY Social History   Tobacco Use   Smoking status: Some Days    Types: Cigars   Smokeless tobacco: Never  Vaping Use   Vaping Use: Never used  Substance Use Topics   Alcohol use: Yes    Comment: daily beer or scotch    Drug use: No       OPHTHALMIC EXAM: Base Eye Exam     Visual Acuity (Snellen - Linear)       Right Left   Dist [place] 20/70 +2 20/80 -2   Dist ph [place] 20/50 +1 20/50         Tonometry (Tonopen, 9:41 AM)        Right Left   Pressure 09 07         Pupils       Dark Light Shape React APD   Right 3 2 Round Minimal None   Left 3 2 Round Minimal None         Visual Fields (Counting fingers)       Left Right     Full   Restrictions Partial outer superior nasal deficiency          Extraocular  Movement       Right Left    Full, Ortho Full, Ortho         Neuro/Psych     Oriented x3: Yes   Mood/Affect: Normal         Dilation     Both eyes: 1.0% Mydriacyl, 2.5% Phenylephrine @ 9:41 AM           Slit Lamp and Fundus Exam     External Exam       Right Left   External Normal Normal         Slit Lamp Exam       Right Left   Lids/Lashes Dermatochalasis - upper lid, Meibomian gland dysfunction Dermatochalasis - upper lid, Meibomian gland dysfunction   Conjunctiva/Sclera White and quiet White and quiet   Cornea arcus, 1+ Punctate epithelial erosions arcus, 1+ Punctate epithelial erosions   Anterior Chamber Deep and quiet Deep and quiet   Iris Round and dilated, No NVI Round and dilated, No NVI   Lens 2-3+ Nuclear sclerosis with brunescence, 2+ Cortical cataract 2-3+ Nuclear sclerosis with brunescence, 2+ Cortical cataract   Anterior Vitreous Vitreous syneresis, blood stained vitreous condensations improving Vitreous syneresis, blood stained vitreous condensations--improving, +fibrosis, boat shaped subhyaloid heme inferior to disc--persistent         Fundus Exam       Right Left   Disc Pink and Sharp, +NVD greatest ST quad--regressing Pink and Sharp, +florid NVD with fibrosis--regressing   C/D Ratio 0.2 0.2   Macula Flat, Blunted foveal reflex, scatterd MA/DBH, + edema--improved Flat, scatterd MA, large pre-retinal hemes temporal macula and NVE temporal macula--improving   Vessels attenuated, Tortuous, NVD; NVE along ST arcades--regressing attenuated, Tortuous, +NVD; NVE along ST arcades--regressing   Periphery Attached; 360 DBH Attached, 360 DBH, light PRP  changes 360 with room for fill in            IMAGING AND PROCEDURES  Imaging and Procedures for 12/02/2020  OCT, Retina - OU - Both Eyes       Right Eye Quality was good. Central Foveal Thickness: 270. Progression has improved. Findings include abnormal foveal contour, no SRF, intraretinal fluid, preretinal fibrosis (Partial PVD, mild interval improvement in IRF and vitreous opacities).   Left Eye Quality was good. Central Foveal Thickness: 314. Progression has improved. Findings include abnormal foveal contour, no SRF, intraretinal fluid, preretinal fibrosis, macular pucker (Mild interval improvement in IRF, persistent sub-hyaloid heme, and vitreous opacities).   Notes *Images captured and stored on drive  Diagnosis / Impression:  PDR with DME OU OD: Partial PVD, mild interval improvement in IRF and vitreous opacities OS: Mild interval improvement in IRF, persistent sub-hyaloid heme, and vitreous opacities  Clinical management:  See below  Abbreviations: NFP - Normal foveal profile. CME - cystoid macular edema. PED - pigment epithelial detachment. IRF - intraretinal fluid. SRF - subretinal fluid. EZ - ellipsoid zone. ERM - epiretinal membrane. ORA - outer retinal atrophy. ORT - outer retinal tubulation. SRHM - subretinal hyper-reflective material. IRHM - intraretinal hyper-reflective material      Panretinal Photocoagulation - OD - Right Eye       LASER PROCEDURE NOTE  Diagnosis:   Proliferative Diabetic Retinopathy, RIGHT EYE  Procedure:  Pan-retinal photocoagulation using slit lamp laser, RIGHT EYE  Anesthesia:  Topical  Surgeon: [name], MD, PhD   Informed consent obtained, operative eye marked, and time out performed prior to initiation of laser.   Lumenis Smart532 slit lamp laser Pattern: 3x3 square  Power: 380 mW Duration: 40 msec  Spot size: 200 microns  # spots: [phone] spots  Complications: None.  Notes: significant vitreous heme and cortical  cataract obscuring view and preventing laser up take inferiorly and scattered focal areas  RTC: 2 wks -- DFE/OCT  Patient tolerated the procedure well and received written and verbal post-procedure care information/education.             ASSESSMENT/PLAN:    ICD-10-CM   1. Proliferative diabetic retinopathy of both eyes with macular edema associated with type 2 diabetes mellitus ([place])  H41.9379 Panretinal Photocoagulation - OD - Right Eye    2. Retinal edema  H35.81 OCT, Retina - OU - Both Eyes    3. Essential hypertension  I10     4. Hypertensive retinopathy of both eyes  H35.033     5. Combined forms of age-related cataract of both eyes  H25.813       1,2. Severe proliferative diabetic retinopathy, both eyes  - s/p PRP OS (11.10.22) - A1c >15% on 10.11.22 - pt reports improved BG control with changes in insulin regimen, insulin pump being shipped - exam shows extensive neovascularization, fibrosis and hemorrhage OU -- slightly improved from prior - FA (11.09.22) shows areas of vascular non-perfusion OU, +NVD OU and +NVE OU - OCT shows OD: mild interval improvement in IRF and vitreous opacities, OS: mild interval improvement in IRF, persistent sub-hyaloid heme, and vitreous opacities - discussed findings, severity of his disease, prognosis and likely need for extensive treatments -- PRP OU, multiple rounds of anti-VEGF therapy OU and possible surgery - recommend PRP OD today, 12.02.22 - pt wishes to proceed with laser - RBA of procedure discussed, questions answered - informed consent obtained and signed - see procedure note - start PF QID OD x7 days - F/u 2 weeks DFE, OCT -- ?fill in PRP  3,4. Hypertensive retinopathy OU - discussed importance of tight BP control - monitor   5. Mixed Cataract OU - The symptoms of cataract, surgical options, and treatments and risks were discussed with patient. - discussed diagnosis and progression - not yet visually significant -  monitor for now  Ophthalmic Meds Ordered this visit:  No orders of the defined types were placed in this encounter.     Return in about 2 weeks (around 12/16/2020) for DFE, OCT.  There are no Patient Instructions on file for this visit.  Explained the diagnoses, plan, and follow up with the patient and they expressed understanding.  Patient expressed understanding of the importance of proper follow up care.   This document serves as a record of services personally performed by [name], MD, PhD. It was created on their behalf by Leonie Douglas, an ophthalmic technician. The creation of this record is the provider's dictation and/or activities during the visit.    Electronically signed by: Leonie Douglas COA, 12/02/20  11:50 AM  This document serves as a record of services personally performed by [name], MD, PhD. It was created on their behalf by Roselee Nova, COMT. The creation of this record is the provider's dictation and/or activities during the visit.  Electronically signed by: Roselee Nova, COMT 12/02/20 11:50 AM  [name], M.D., Ph.D. Diseases & Surgery of the Retina and [place] 12/02/2020  I have reviewed the above documentation for accuracy and completeness, and I agree with the above. [name], M.D., Ph.D. 12/02/20 11:53 AM   Abbreviations: M myopia (nearsighted); A  astigmatism; H hyperopia (farsighted); P presbyopia; Mrx spectacle prescription;  CTL contact lenses; OD right eye; OS left eye; OU both eyes  XT exotropia; ET esotropia; PEK punctate epithelial keratitis; PEE punctate epithelial erosions; DES dry eye syndrome; MGD meibomian gland dysfunction; ATs artificial tears; PFAT's preservative free artificial tears; [place] nuclear sclerotic cataract; PSC posterior subcapsular cataract; ERM epi-retinal membrane; PVD posterior vitreous detachment; RD retinal detachment; DM diabetes mellitus; DR diabetic retinopathy;  NPDR non-proliferative diabetic retinopathy; PDR proliferative diabetic retinopathy; CSME clinically significant macular edema; DME diabetic macular edema; dbh dot blot hemorrhages; CWS cotton wool spot; POAG primary open angle glaucoma; C/D cup-to-disc ratio; HVF humphrey visual field; GVF goldmann visual field; OCT optical coherence tomography; IOP intraocular pressure; BRVO Branch retinal vein occlusion; CRVO central retinal vein occlusion; CRAO central retinal artery occlusion; BRAO branch retinal artery occlusion; RT retinal tear; SB scleral buckle; PPV pars plana vitrectomy; VH Vitreous hemorrhage; PRP panretinal laser photocoagulation; IVK intravitreal kenalog; VMT vitreomacular traction; MH Macular hole;  NVD neovascularization of the disc; NVE neovascularization elsewhere; AREDS age related eye disease study; ARMD age related macular degeneration; POAG primary open angle glaucoma; EBMD epithelial/anterior basement membrane dystrophy; ACIOL anterior chamber intraocular lens; IOL intraocular lens; PCIOL posterior chamber intraocular lens; Phaco/IOL phacoemulsification with intraocular lens placement; [place] photorefractive keratectomy; LASIK laser assisted in situ keratomileusis; HTN hypertension; DM diabetes mellitus; COPD chronic obstructive pulmonary disease

## 2020-11-30 NOTE — Telephone Encounter (Signed)
Omnipod Kit has been approved through 12.31.23

## 2020-12-01 ENCOUNTER — Encounter (HOSPITAL_BASED_OUTPATIENT_CLINIC_OR_DEPARTMENT_OTHER): Payer: HMO | Admitting: Internal Medicine

## 2020-12-02 ENCOUNTER — Ambulatory Visit (INDEPENDENT_AMBULATORY_CARE_PROVIDER_SITE_OTHER): Payer: HMO | Admitting: Ophthalmology

## 2020-12-02 ENCOUNTER — Other Ambulatory Visit: Payer: Self-pay

## 2020-12-02 ENCOUNTER — Encounter (INDEPENDENT_AMBULATORY_CARE_PROVIDER_SITE_OTHER): Payer: Self-pay | Admitting: Ophthalmology

## 2020-12-02 DIAGNOSIS — L97519 Non-pressure chronic ulcer of other part of right foot with unspecified severity: Secondary | ICD-10-CM | POA: Diagnosis not present

## 2020-12-02 DIAGNOSIS — H35033 Hypertensive retinopathy, bilateral: Secondary | ICD-10-CM | POA: Diagnosis not present

## 2020-12-02 DIAGNOSIS — H25813 Combined forms of age-related cataract, bilateral: Secondary | ICD-10-CM | POA: Diagnosis not present

## 2020-12-02 DIAGNOSIS — I10 Essential (primary) hypertension: Secondary | ICD-10-CM | POA: Diagnosis not present

## 2020-12-02 DIAGNOSIS — E113513 Type 2 diabetes mellitus with proliferative diabetic retinopathy with macular edema, bilateral: Secondary | ICD-10-CM | POA: Diagnosis not present

## 2020-12-02 DIAGNOSIS — H3581 Retinal edema: Secondary | ICD-10-CM

## 2020-12-02 DIAGNOSIS — L89154 Pressure ulcer of sacral region, stage 4: Secondary | ICD-10-CM | POA: Diagnosis not present

## 2020-12-02 DIAGNOSIS — L97529 Non-pressure chronic ulcer of other part of left foot with unspecified severity: Secondary | ICD-10-CM | POA: Diagnosis not present

## 2020-12-02 DIAGNOSIS — L97429 Non-pressure chronic ulcer of left heel and midfoot with unspecified severity: Secondary | ICD-10-CM | POA: Diagnosis not present

## 2020-12-05 DIAGNOSIS — Z7901 Long term (current) use of anticoagulants: Secondary | ICD-10-CM | POA: Diagnosis not present

## 2020-12-05 DIAGNOSIS — E11621 Type 2 diabetes mellitus with foot ulcer: Secondary | ICD-10-CM | POA: Diagnosis not present

## 2020-12-05 DIAGNOSIS — L97511 Non-pressure chronic ulcer of other part of right foot limited to breakdown of skin: Secondary | ICD-10-CM | POA: Diagnosis not present

## 2020-12-05 DIAGNOSIS — L409 Psoriasis, unspecified: Secondary | ICD-10-CM | POA: Diagnosis not present

## 2020-12-05 DIAGNOSIS — N138 Other obstructive and reflux uropathy: Secondary | ICD-10-CM | POA: Diagnosis not present

## 2020-12-05 DIAGNOSIS — I480 Paroxysmal atrial fibrillation: Secondary | ICD-10-CM | POA: Diagnosis not present

## 2020-12-05 DIAGNOSIS — E1142 Type 2 diabetes mellitus with diabetic polyneuropathy: Secondary | ICD-10-CM | POA: Diagnosis not present

## 2020-12-05 DIAGNOSIS — M720 Palmar fascial fibromatosis [Dupuytren]: Secondary | ICD-10-CM | POA: Diagnosis not present

## 2020-12-05 DIAGNOSIS — F3341 Major depressive disorder, recurrent, in partial remission: Secondary | ICD-10-CM | POA: Diagnosis not present

## 2020-12-05 DIAGNOSIS — K5904 Chronic idiopathic constipation: Secondary | ICD-10-CM | POA: Diagnosis not present

## 2020-12-05 DIAGNOSIS — E1151 Type 2 diabetes mellitus with diabetic peripheral angiopathy without gangrene: Secondary | ICD-10-CM | POA: Diagnosis not present

## 2020-12-05 DIAGNOSIS — D649 Anemia, unspecified: Secondary | ICD-10-CM | POA: Diagnosis not present

## 2020-12-05 DIAGNOSIS — Z794 Long term (current) use of insulin: Secondary | ICD-10-CM | POA: Diagnosis not present

## 2020-12-05 DIAGNOSIS — M1990 Unspecified osteoarthritis, unspecified site: Secondary | ICD-10-CM | POA: Diagnosis not present

## 2020-12-05 DIAGNOSIS — M47812 Spondylosis without myelopathy or radiculopathy, cervical region: Secondary | ICD-10-CM | POA: Diagnosis not present

## 2020-12-05 DIAGNOSIS — S42102D Fracture of unspecified part of scapula, left shoulder, subsequent encounter for fracture with routine healing: Secondary | ICD-10-CM | POA: Diagnosis not present

## 2020-12-05 DIAGNOSIS — F419 Anxiety disorder, unspecified: Secondary | ICD-10-CM | POA: Diagnosis not present

## 2020-12-05 DIAGNOSIS — I2510 Atherosclerotic heart disease of native coronary artery without angina pectoris: Secondary | ICD-10-CM | POA: Diagnosis not present

## 2020-12-05 DIAGNOSIS — I10 Essential (primary) hypertension: Secondary | ICD-10-CM | POA: Diagnosis not present

## 2020-12-05 DIAGNOSIS — N401 Enlarged prostate with lower urinary tract symptoms: Secondary | ICD-10-CM | POA: Diagnosis not present

## 2020-12-05 DIAGNOSIS — L97423 Non-pressure chronic ulcer of left heel and midfoot with necrosis of muscle: Secondary | ICD-10-CM | POA: Diagnosis not present

## 2020-12-05 DIAGNOSIS — Z7982 Long term (current) use of aspirin: Secondary | ICD-10-CM | POA: Diagnosis not present

## 2020-12-05 DIAGNOSIS — G8929 Other chronic pain: Secondary | ICD-10-CM | POA: Diagnosis not present

## 2020-12-05 DIAGNOSIS — E785 Hyperlipidemia, unspecified: Secondary | ICD-10-CM | POA: Diagnosis not present

## 2020-12-08 ENCOUNTER — Encounter (HOSPITAL_BASED_OUTPATIENT_CLINIC_OR_DEPARTMENT_OTHER): Payer: HMO | Attending: Internal Medicine | Admitting: Internal Medicine

## 2020-12-08 ENCOUNTER — Other Ambulatory Visit: Payer: Self-pay

## 2020-12-08 DIAGNOSIS — Z951 Presence of aortocoronary bypass graft: Secondary | ICD-10-CM | POA: Diagnosis not present

## 2020-12-08 DIAGNOSIS — E1151 Type 2 diabetes mellitus with diabetic peripheral angiopathy without gangrene: Secondary | ICD-10-CM | POA: Diagnosis not present

## 2020-12-08 DIAGNOSIS — L89620 Pressure ulcer of left heel, unstageable: Secondary | ICD-10-CM | POA: Diagnosis not present

## 2020-12-08 DIAGNOSIS — E1142 Type 2 diabetes mellitus with diabetic polyneuropathy: Secondary | ICD-10-CM | POA: Insufficient documentation

## 2020-12-08 DIAGNOSIS — E11621 Type 2 diabetes mellitus with foot ulcer: Secondary | ICD-10-CM | POA: Diagnosis not present

## 2020-12-08 DIAGNOSIS — L97511 Non-pressure chronic ulcer of other part of right foot limited to breakdown of skin: Secondary | ICD-10-CM | POA: Diagnosis not present

## 2020-12-08 DIAGNOSIS — L97528 Non-pressure chronic ulcer of other part of left foot with other specified severity: Secondary | ICD-10-CM | POA: Diagnosis not present

## 2020-12-08 DIAGNOSIS — L97512 Non-pressure chronic ulcer of other part of right foot with fat layer exposed: Secondary | ICD-10-CM | POA: Diagnosis not present

## 2020-12-08 NOTE — Progress Notes (Signed)
THAINE, GARRIGA ([id number]) Visit Report for 12/08/2020 Debridement Details Patient Name: Date of Service: [name] [name] [facility] THA N L. 12/08/2020 8:45 A M Medical Record Number: [id number] Patient Account Number: [id number] Date of Birth/Sex: Treating RN: 12-Jun-1955 (65 y.o. [name] Primary Care Provider: [name] Other Clinician: Referring Provider: Treating Provider/Extender: [name] in Treatment: 22 Debridement Performed for Assessment: Wound #3 Right,Lateral Foot Performed By: Clinician [name], RN Debridement Type: Chemical/Enzymatic/Mechanical Agent Used: Santyl Severity of Tissue Pre Debridement: Fat layer exposed Level of Consciousness (Pre-procedure): Awake and Alert Pre-procedure Verification/Time Out No Taken: Bleeding: None Response to Treatment: Procedure was tolerated well Level of Consciousness (Post- Awake and Alert procedure): Post Debridement Measurements of Total Wound Length: (cm) 1.1 Width: (cm) 1.3 Depth: (cm) 0.3 Volume: (cm) 0.337 Character of Wound/Ulcer Post Debridement: Requires Further Debridement Severity of Tissue Post Debridement: Fat layer exposed Post Procedure Diagnosis Same as Pre-procedure Electronic Signature(s) Signed: 12/08/2020 3:55:54 PM By: [name] RN, BSN Signed: 12/08/2020 4:05:05 PM By: Linton Ham MD Entered By: [name] on 12/08/2020 08:41:48 -------------------------------------------------------------------------------- HPI Details Patient Name: Date of Service: [name] [name] [facility] THA N L. 12/08/2020 8:45 A M Medical Record Number: [id number] Patient Account Number: [id number] Date of Birth/Sex: Treating RN: [birth year] (65 y.o. M) Primary Care Provider: [name] Other Clinician: Referring Provider: Treating Provider/Extender: [name] in Treatment: 26 History of Present Illness HPI Description: ADMISSION 07/07/2020 This is a  65 year old man who is a type II diabetic. I have reviewed this in epic. It appeared that his diabetes was diagnosed in 2009 at which time he was on metformin. He was followed for a period of time by an endocrinologist with [facility] to always labeled him as a type II diabetic. Recently in epic there is been a switch to type 1 diabetes. If there were defining antibody test done I do not see them and I think the totality of the evidence would suggest that he is a type II diabetic. His recent problem started in May when he fell down 17 stairs. He suffered a scapular fracture he was hospitalized at [facility] for 9 days and then discharged to [facility]. His mother who is present said that most of these wounds seem to develop surrounding the discharge from hospital or the stay at [facility]. He has an area on his lower sacrum which appears currently to be a stage II but per their description this is actually improved quite a bit. He has an area on the left lateral foot at roughly the base of the fifth metatarsal, the left Achilles heel and the right fifth metatarsal head laterally they have been using topical antibiotics. Seen by primary care yesterday and placed on Keflex The patient had arterial studies on 05/05/2020. I wondered whether this had something to do with wounds on his feet at that time but I have not been able to determine this. At that time his ABI on the right was 1.09 with a TBI of 0.65 on the left 0.88 with a TBI of 0.44. Waveforms were triphasic on the right monophasic on the left. He likely has significant PAD on the left Past medical history includes type 2 diabetes poorly controlled last hemoglobin A1c at 12.7 in May, hypertension, hyperlipidemia, atrial fib on Eliquis, coronary artery disease status post CABG, left scapular fracture in May of this year, AAA repair. Bilateral leg weakness ABIs were not done in our clinic as they were just done  in May of this year at [facility] 7/14;  patient arrives back in clinic he has areas on the left Achilles heel, base of the left fifth metatarsal, right lateral fifth metatarsal. The area he had over the coccyx appears to be healed but this area remains vulnerable. We have been using Iodoflex. I rereviewed his ABIs which were done in May. I do not know exactly what prompted these test however they do show a TBI of 0.44 on the left and monophasic waveforms. 7/21; the patient's wounds are about the same. Certainly no healing and the nurses noted some green drainage on the right fifth metatarsal head, left Achilles a little larger. We have been using silver alginate really without any improvement. We did get an appointment with vascular surgery however its that it did the end of August and they seem to want to repeat the noninvasive test that they have already done in May. I am not sure what the issue here is. We advised the patient's sister to call and point to the idea that they have already had noninvasive studies. The real question I have is does this patient need an angiogram. He is a diabetic and it is possible that the ABIs are falsely elevated because of medial calcification. At least on the left his TBI was abnormal 08/08/2020 upon evaluation today patient appears to be doing about the same in regard to his wounds. He still waiting the appointment with vascular. This is something that hopefully should be undertaken shortly. The 29th of this month is when he is actually scheduled although they have put in a call to move this up if at all possible. Fortunately there does not appear to be any signs of active infection at this time. No fevers, chills, nausea, vomiting, or diarrhea. 8/18; appointment with Dr. Trula Slade on 8/29. The patient has wounds on the left Achilles heel, left lateral foot and the right lateral fifth metatarsal head. We have been using Santyl under Hydrofera Blue. The surface of especially the left heel looks better to me  than what I remember. 9/1; since the patient was last here he underwent angiography by Dr. Trula Slade he also underwent noninvasive studies first showing a ABI of of 0.64 with monophasic waveforms. His TBI was 0.35 with a left great toe pressure of 46 this is about the same pattern as we saw previously. The left was a lot worse than the right. He underwent an angiogram on 08/29/2020. He had multiple 80% peroneal artery stenosis which was his dominant vessel which was treated with angioplasty with no residual stenosis. 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 The anterior tibial artery was also occluded. An attempt was made at recanalization of the anterior tibial artery however this was unsuccessful. Noted that the peroneal artery reconstitutes the posterior tibial artery and the anterior tibial artery creating the plantar arch. The patient has areas on the Achilles left heel left lateral foot and right lateral fifth metatarsal head we have been using Santyl and Hydrofera Blue 9/22; since the patient was last here he went for repeat vascular studies. On the right his ABI was 0.94 with biphasic waveforms TBI of 0.73 with a great toe pressure of 75. On the left which is the problematic wound his ABI was 1.08 but with biphasic waveforms at the PTA. Biphasic waveforms at the dorsalis pedis his TBI was 0.63 with a great toe pressure of 65. On the left however this does represent a fairly marked improvement. We have  been using Santyl and Hydrofera Blue 9/29 left Achilles area. Still is having necrotic surface. Bleeds very easily as the patient is on a combination of Eliquis aspirin and Plavix just stopped yesterday. I am not going to do any debridement until next week. We are using Santyl and Hydrofera Blue. His arterial status on the right was actually quite good. The area on the fifth metatarsal head needs additional debridement. A total contact cast in this area is not out of the question. He does  describe claudication I believe with minimal activity. He is not very active X-ray we ordered last week is still pending in terms of the report 10/6 left Achilles area. In terms of the necrotic surface this is quite a bit better. There is still some debris in the posterior part of the wound however this looks better. Anteriorly the wound appears to be epithelialized there is still a divot in the middle of this but this does not probe to deep tissue. The area on the right fifth metatarsal head laterally really is not making as much progress as I would have liked. Still not completely a viable surface. Both the patient and his wife agree that he is not an external rotator in terms of ambulation [an outie]. We have been using Santyl and Hydrofera Blue for a prolonged period. On the left Achilles which looked like it is worse wound this seems to be making some progress not so much on the right foot. The patient and his wife state that they are rigorous about offloading both of these areas He was treated with angioplasty as I believe on the right peroneal artery that had multiple stenosis. This is a dominant vessel the anterior tibial and posterior tibial arteries are occluded however the foot is fed via collaterals from the peroneal. From my notes I am not able to understand the vascular supply on the left I will have to review this. 10/13; left Achilles top part of this looks healthy granulation in the bottom part of this wound again completely necrotic requiring debridement. The area on the right lateral fifth met head also requires debridement. The majority of this is epithelialized however he she there is a refractory part to this. 10/20; left Achilles 100% better looking wound surface. Necrotic upper 50% seems to have resolved at least by inspection under illumination. Culture I did of this area last week postdebridement showed Proteus and Citrobacter. Both sensitive to quinolones and I put him on  ciprofloxacin. We have been using silver alginate on the wound on the heel and Santyl and Hydrofera Blue on the right fifth met head. 10/27; left Achilles is measuring much smaller we are using topical gentamicin under silver alginate. Right fifth met head still fibrinous debris on the surface we are using Santyl and Hydrofera Blue. Patient lost his brother suddenly last week he was unable to keep his vascular surgery appointment. 11/3; left Achilles not as viable surface as I might like. Some hypergranulation. Right fifth metatarsal head still completely nonviable surface. It turns out that she was not using topical gentamicin on the left heel [mother] they have been using Santyl and Hydrofera Blue on the right fifth metatarsal head. The patient did have noninvasive vascular studies on 10/ 28/22. He had triphasic waveforms up to the level of the proximal PTA and then a 50 to 74% stenosis in the mid PTA. Peroneal artery was triphasic as was the anterior tibial. He follows up with Dr. Trula Slade on 11/7. 11/17; since the  patient was last here he saw Dr. Trula Slade and went for ANGIOGRAPHY on 11/15/2020. He has wounds on the left Achilles heel and the right lateral fifth metatarsal head. He has known PAD and had previous revascularization in August. On this occasion on the right his right common femoral, profundofemoral and superficial femoral and popliteal arteries were widely patent the dominant vessel is the posterior tibial which had several areas of stenosis greater than 80% he went underwent angioplasty in this area. He had no interventions on the left however he is left common femoral, profundofemoral and superficial femoral arteries were widely patent as well as the popliteal he has recurrent stenosis greater than 50% within the peroneal artery which is a single- vessel runoff. The thought was that he may require further angiography and reangioplasty on the left peroneal artery. Overall the patient  has severe PAD he has undergone angioplasty of the right posterior tibial artery. He arrives in clinic today with the Achilles heel area on the left surprisingly better with marked improvement in the surface area on the right not nearly as good but stable 12/8; patient has follow-up arterial Doppler studies on 12/[facility] at the [facility]. He is now being revascularized on both sides. Fortunately on the right Achilles area this is just about closed slight scabbing/eschar. I did not remove this today. The area on the right fifth met head looked better after last week's debridement we have been using Santyl and Hydrofera Blue Electronic Signature(s) Signed: 12/08/2020 4:05:05 PM By: Linton Ham MD Entered By: Linton Ham on 12/08/2020 08:53:07 -------------------------------------------------------------------------------- Physical Exam Details Patient Name: Date of Service: [name] [name] [facility] THA N L. 12/08/2020 8:45 A M Medical Record Number: [id number] Patient Account Number: [id number] Date of Birth/Sex: Treating RN: [birth year] (65 y.o. M) Primary Care Provider: [name] Other Clinician: Referring Provider: Treating Provider/Extender: [name] in Treatment: 22 Constitutional Sitting or standing Blood Pressure is within target range for patient.. Pulse regular and within target range for patient.Marland Kitchen Respirations regular, non-labored and within target range.. Temperature is normal and within the target range for the patient.Marland Kitchen Appears in no distress. Cardiovascular On the left is dorsalis pedis pulses palpable but reduced on the right I did not feel either the dorsalis pedis or the posterior tibial left posterior tibial is also not palpable the left forefoot is cool. Notes Wound exam; left Achilles slight scabbed area scant amount of eschar. I did not debride this today. No evidence of underlying infection. This may be closed On the right lateral fifth  metatarsal head better looking wound surface I did not debride this today there is some fibrinous debris on the surface however. Electronic Signature(s) Signed: 12/08/2020 4:05:05 PM By: Linton Ham MD Entered By: Linton Ham on 12/08/2020 08:54:28 -------------------------------------------------------------------------------- Physician Orders Details Patient Name: Date of Service: [name] [name] [facility] THA N L. 12/08/2020 8:45 A M Medical Record Number: [id number] Patient Account Number: [id number] Date of Birth/Sex: Treating RN: [birth year] (64 y.o. [name], [name] Primary Care Provider: [name] Other Clinician: Referring Provider: Treating Provider/Extender: [name] in Treatment: 25 Verbal / Phone Orders: No Diagnosis Coding ICD-10 Coding Code Description E11.621 Type 2 diabetes mellitus with foot ulcer E11.51 Type 2 diabetes mellitus with diabetic peripheral angiopathy without gangrene L97.528 Non-pressure chronic ulcer of other part of left foot with other specified severity L97.511 Non-pressure chronic ulcer of other part of right foot limited to breakdown of skin E11.42 Type 2 diabetes mellitus with diabetic  polyneuropathy Follow-up Appointments ppointment in 1 week. - Dr. Dellia Nims Return A Bathing/ Shower/ Hygiene May shower and wash wound with soap and water. - with dressing changes only. Edema Control - Lymphedema / SCD / Other Elevate legs to the level of the heart or above for 30 minutes daily and/or when sitting, a frequency of: - 3-4 times a day throughout the day. Moisturize legs daily. - every night before bed. Off-Loading Turn and reposition every 2 hours Other: - float heels while resting in bed or chair. Use a pillow or rolled sheet under legs to offload the heels. Home Health New wound care orders this week; continue Home Health for wound care. May utilize formulary equivalent dressing for wound treatment orders unless otherwise  specified. Other Home Health Orders/Instructions: - [facility] home health three times a week and family to change all other days. Wound Treatment Wound #1 - Calcaneus Wound Laterality: Left Cleanser: Soap and Water (Home Health) 2 x Per Week/30 Days Discharge Instructions: May shower and wash wound with dial antibacterial soap and water prior to dressing change. Cleanser: Wound Cleanser (Home Health) 2 x Per Week/30 Days Discharge Instructions: Cleanse the wound with wound cleanser prior to applying a clean dressing using gauze sponges, not tissue or cotton balls. Prim Dressing: PolyMem Silver Non-Adhesive Dressing, 4.25x4.25 in (Home Health) 2 x Per Week/30 Days ary Discharge Instructions: Apply to wound bed as instructed Secondary Dressing: Woven Gauze Sponge, Non-Sterile 4x4 in (Home Health) 2 x Per Week/30 Days Discharge Instructions: Apply over primary dressing as directed. Secondary Dressing: ALLEVYN Heel 4 1/2in x 5 1/2in / 10.5cm x 13.5cm (Home Health) 2 x Per Week/30 Days Discharge Instructions: Apply over primary dressing as directed. Secured With: [employer], 4.5x3.1 (in/yd) (Home Health) 2 x Per Week/30 Days Discharge Instructions: Secure with Kerlix as directed. Secured With: 38M Medipore H Soft Cloth Surgical T 4 x 2 (in/yd) (Home Health) 2 x Per Week/30 Days ape Discharge Instructions: Secure dressing with tape as directed. Wound #3 - Foot Wound Laterality: Right, Lateral Cleanser: Soap and Water (Home Health) 1 x Per Day/30 Days Discharge Instructions: May shower and wash wound with dial antibacterial soap and water prior to dressing change. Cleanser: Wound Cleanser (Home Health) 1 x Per Day/30 Days Discharge Instructions: Cleanse the wound with wound cleanser prior to applying a clean dressing using gauze sponges, not tissue or cotton balls. Prim Dressing: Hydrofera Blue Classic Foam, 4x4 in (Home Health) 1 x Per Day/30 Days ary Discharge Instructions: Moisten with  saline prior to applying to wound bed Prim Dressing: Santyl Ointment ([place]) 1 x Per Day/30 Days ary Discharge Instructions: Apply nickel thick amount to wound bed as instructed Secondary Dressing: Woven Gauze Sponge, Non-Sterile 4x4 in (Home Health) 1 x Per Day/30 Days Discharge Instructions: Apply over primary dressing as directed. Secured With: [employer], 4.5x3.1 (in/yd) (Home Health) 1 x Per Day/30 Days Discharge Instructions: Secure with Kerlix as directed. Secured With: 38M Medipore H Soft Cloth Surgical T 4 x 2 (in/yd) (Home Health) 1 x Per Day/30 Days ape Discharge Instructions: Secure dressing with tape as directed. Electronic Signature(s) Signed: 12/08/2020 3:55:54 PM By: [name] RN, BSN Signed: 12/08/2020 4:05:05 PM By: Linton Ham MD Entered By: [name] on 12/08/2020 08:39:49 -------------------------------------------------------------------------------- Problem List Details Patient Name: Date of Service: [name] [name] [facility] THA N L. 12/08/2020 8:45 A M Medical Record Number: [id number] Patient Account Number: [id number] Date of Birth/Sex: Treating RN: 07/15/1955 (65 y.o. [name], [name] Primary  Care Provider: Other Clinician: [name] Referring Provider: Treating Provider/Extender: [name] in Treatment: 66 Active Problems ICD-10 Encounter Code Description Active Date MDM Diagnosis E11.621 Type 2 diabetes mellitus with foot ulcer 07/07/2020 No Yes E11.51 Type 2 diabetes mellitus with diabetic peripheral angiopathy without gangrene 09/01/2020 No Yes L97.528 Non-pressure chronic ulcer of other part of left foot with other specified 07/07/2020 No Yes severity L97.511 Non-pressure chronic ulcer of other part of right foot limited to breakdown of 07/07/2020 No Yes skin E11.42 Type 2 diabetes mellitus with diabetic polyneuropathy 07/07/2020 No Yes Inactive Problems ICD-10 Code Description Active Date Inactive  Date L89.152 Pressure ulcer of sacral region, stage 2 07/07/2020 07/07/2020 W65.681 Pressure ulcer of left heel, stage 2 07/07/2020 07/07/2020 Resolved Problems Electronic Signature(s) Signed: 12/08/2020 4:05:05 PM By: Linton Ham MD Entered By: Linton Ham on 12/08/2020 08:51:07 -------------------------------------------------------------------------------- Progress Note Details Patient Name: Date of Service: [name] [name] [facility] THA N L. 12/08/2020 8:45 A M Medical Record Number: [id number] Patient Account Number: [id number] Date of Birth/Sex: Treating RN: [birth year] (65 y.o. M) Primary Care Provider: [name] Other Clinician: Referring Provider: Treating Provider/Extender: [name] in Treatment: 22 Subjective History of Present Illness (HPI) ADMISSION 07/07/2020 This is a 65 year old man who is a type II diabetic. I have reviewed this in epic. It appeared that his diabetes was diagnosed in 2009 at which time he was on metformin. He was followed for a period of time by an endocrinologist with [facility] to always labeled him as a type II diabetic. Recently in epic there is been a switch to type 1 diabetes. If there were defining antibody test done I do not see them and I think the totality of the evidence would suggest that he is a type II diabetic. His recent problem started in May when he fell down 17 stairs. He suffered a scapular fracture he was hospitalized at [facility] for 9 days and then discharged to [facility]. His mother who is present said that most of these wounds seem to develop surrounding the discharge from hospital or the stay at [facility]. He has an area on his lower sacrum which appears currently to be a stage II but per their description this is actually improved quite a bit. He has an area on the left lateral foot at roughly the base of the fifth metatarsal, the left Achilles heel and the right fifth metatarsal head laterally they have been  using topical antibiotics. Seen by primary care yesterday and placed on Keflex The patient had arterial studies on 05/05/2020. I wondered whether this had something to do with wounds on his feet at that time but I have not been able to determine this. At that time his ABI on the right was 1.09 with a TBI of 0.65 on the left 0.88 with a TBI of 0.44. Waveforms were triphasic on the right monophasic on the left. He likely has significant PAD on the left Past medical history includes type 2 diabetes poorly controlled last hemoglobin A1c at 12.7 in May, hypertension, hyperlipidemia, atrial fib on Eliquis, coronary artery disease status post CABG, left scapular fracture in May of this year, AAA repair. Bilateral leg weakness ABIs were not done in our clinic as they were just done in May of this year at vein and vascular 7/14; patient arrives back in clinic he has areas on the left Achilles heel, base of the left fifth metatarsal, right lateral fifth metatarsal. The area he had over  the coccyx appears to be healed but this area remains vulnerable. We have been using Iodoflex. I rereviewed his ABIs which were done in May. I do not know exactly what prompted these test however they do show a TBI of 0.44 on the left and monophasic waveforms. 7/21; the patient's wounds are about the same. Certainly no healing and the nurses noted some green drainage on the right fifth metatarsal head, left Achilles a little larger. We have been using silver alginate really without any improvement. We did get an appointment with vascular surgery however its that it did the end of August and they seem to want to repeat the noninvasive test that they have already done in May. I am not sure what the issue here is. We advised the patient's sister to call and point to the idea that they have already had noninvasive studies. The real question I have is does this patient need an angiogram. He is a diabetic and it is possible that the  ABIs are falsely elevated because of medial calcification. At least on the left his TBI was abnormal 08/08/2020 upon evaluation today patient appears to be doing about the same in regard to his wounds. He still waiting the appointment with vascular. This is something that hopefully should be undertaken shortly. The 29th of this month is when he is actually scheduled although they have put in a call to move this up if at all possible. Fortunately there does not appear to be any signs of active infection at this time. No fevers, chills, nausea, vomiting, or diarrhea. 8/18; appointment with Dr. Trula Slade on 8/29. The patient has wounds on the left Achilles heel, left lateral foot and the right lateral fifth metatarsal head. We have been using Santyl under Hydrofera Blue. The surface of especially the left heel looks better to me than what I remember. 9/1; since the patient was last here he underwent angiography by Dr. Trula Slade he also underwent noninvasive studies first showing a ABI of of 0.64 with monophasic waveforms. His TBI was 0.35 with a left great toe pressure of 46 this is about the same pattern as we saw previously. The left was a lot worse than the right. He underwent an angiogram on 08/29/2020. He had multiple 80% peroneal artery stenosis which was his dominant vessel which was treated with angioplasty with no residual stenosis. 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 The anterior tibial artery was also occluded. An attempt was made at recanalization of the anterior tibial artery however this was unsuccessful. Noted that the peroneal artery reconstitutes the posterior tibial artery and the anterior tibial artery creating the plantar arch. The patient has areas on the Achilles left heel left lateral foot and right lateral fifth metatarsal head we have been using Santyl and Hydrofera Blue 9/22; since the patient was last here he went for repeat vascular studies. On the right his ABI was 0.94  with biphasic waveforms TBI of 0.73 with a great toe pressure of 75. On the left which is the problematic wound his ABI was 1.08 but with biphasic waveforms at the PTA. Biphasic waveforms at the dorsalis pedis his TBI was 0.63 with a great toe pressure of 65. On the left however this does represent a fairly marked improvement. We have been using Santyl and Hydrofera Blue 9/29 left Achilles area. Still is having necrotic surface. Bleeds very easily as the patient is on a combination of Eliquis aspirin and Plavix just stopped yesterday. I am not going to  do any debridement until next week. We are using Santyl and Hydrofera Blue. His arterial status on the right was actually quite good. The area on the fifth metatarsal head needs additional debridement. A total contact cast in this area is not out of the question. He does describe claudication I believe with minimal activity. He is not very active X-ray we ordered last week is still pending in terms of the report 10/6 left Achilles area. In terms of the necrotic surface this is quite a bit better. There is still some debris in the posterior part of the wound however this looks better. Anteriorly the wound appears to be epithelialized there is still a divot in the middle of this but this does not probe to deep tissue. The area on the right fifth metatarsal head laterally really is not making as much progress as I would have liked. Still not completely a viable surface. Both the patient and his wife agree that he is not an external rotator in terms of ambulation [an outie]. We have been using Santyl and Hydrofera Blue for a prolonged period. On the left Achilles which looked like it is worse wound this seems to be making some progress not so much on the right foot. The patient and his wife state that they are rigorous about offloading both of these areas He was treated with angioplasty as I believe on the right peroneal artery that had multiple stenosis.  This is a dominant vessel the anterior tibial and posterior tibial arteries are occluded however the foot is fed via collaterals from the peroneal. From my notes I am not able to understand the vascular supply on the left I will have to review this. 10/13; left Achilles top part of this looks healthy granulation in the bottom part of this wound again completely necrotic requiring debridement. oo The area on the right lateral fifth met head also requires debridement. The majority of this is epithelialized however he she there is a refractory part to this. 10/20; left Achilles 100% better looking wound surface. Necrotic upper 50% seems to have resolved at least by inspection under illumination. Culture I did of this area last week postdebridement showed Proteus and Citrobacter. Both sensitive to quinolones and I put him on ciprofloxacin. We have been using silver alginate on the wound on the heel and Santyl and Hydrofera Blue on the right fifth met head. 10/27; left Achilles is measuring much smaller we are using topical gentamicin under silver alginate. Right fifth met head still fibrinous debris on the surface we are using Santyl and Hydrofera Blue. Patient lost his brother suddenly last week he was unable to keep his vascular surgery appointment. 11/3; left Achilles not as viable surface as I might like. Some hypergranulation. Right fifth metatarsal head still completely nonviable surface. It turns out that she was not using topical gentamicin on the left heel [mother] they have been using Santyl and Hydrofera Blue on the right fifth metatarsal head. The patient did have noninvasive vascular studies on 10/ 28/22. He had triphasic waveforms up to the level of the proximal PTA and then a 50 to 74% stenosis in the mid PTA. Peroneal artery was triphasic as was the anterior tibial. He follows up with Dr. Trula Slade on 11/7. 11/17; since the patient was last here he saw Dr. Trula Slade and went for ANGIOGRAPHY  on 11/15/2020. He has wounds on the left Achilles heel and the right lateral fifth metatarsal head. He has known PAD and had previous revascularization  in August. On this occasion on the right his right common femoral, profundofemoral and superficial femoral and popliteal arteries were widely patent the dominant vessel is the posterior tibial which had several areas of stenosis greater than 80% he went underwent angioplasty in this area. He had no interventions on the left however he is left common femoral, profundofemoral and superficial femoral arteries were widely patent as well as the popliteal he has recurrent stenosis greater than 50% within the peroneal artery which is a single- vessel runoff. The thought was that he may require further angiography and reangioplasty on the left peroneal artery. Overall the patient has severe PAD he has undergone angioplasty of the right posterior tibial artery. He arrives in clinic today with the Achilles heel area on the left surprisingly better with marked improvement in the surface area on the right not nearly as good but stable 12/8; patient has follow-up arterial Doppler studies on 12/19 at the [facility] vascular. He is now being revascularized on both sides. Fortunately on the right Achilles area this is just about closed slight scabbing/eschar. I did not remove this today. The area on the right fifth met head looked better after last week's debridement we have been using Santyl and Hydrofera Blue Objective Constitutional Sitting or standing Blood Pressure is within target range for patient.. Pulse regular and within target range for patient.Marland Kitchen Respirations regular, non-labored and within target range.. Temperature is normal and within the target range for the patient.Marland Kitchen Appears in no distress. Vitals Time Taken: 8:28 AM, Height: 74 in, Weight: 151 lbs, BMI: 19.4, Temperature: 98.5 F, Pulse: 73 bpm, Respiratory Rate: 17 breaths/min, Blood Pressure: 140/86  mmHg. Cardiovascular On the left is dorsalis pedis pulses palpable but reduced on the right I did not feel either the dorsalis pedis or the posterior tibial left posterior tibial is also not palpable the left forefoot is cool. General Notes: Wound exam; left Achilles slight scabbed area scant amount of eschar. I did not debride this today. No evidence of underlying infection. This may be closed oo On the right lateral fifth metatarsal head better looking wound surface I did not debride this today there is some fibrinous debris on the surface however. Integumentary (Hair, Skin) Wound #1 status is Open. Original cause of wound was Pressure Injury. The date acquired was: 05/23/2020. The wound has been in treatment 22 weeks. The wound is located on the Left Calcaneus. The wound measures 0.1cm length x 0.1cm width x 0.1cm depth; 0.008cm^2 area and 0.001cm^3 volume. There is Fat Layer (Subcutaneous Tissue) exposed. There is no tunneling or undermining noted. There is a medium amount of serosanguineous drainage noted. The wound margin is distinct with the outline attached to the wound base. There is medium (34-66%) pink, friable, hyper - granulation within the wound bed. There is a medium (34-66%) amount of necrotic tissue within the wound bed including Adherent Slough. Wound #3 status is Open. Original cause of wound was Gradually Appeared. The date acquired was: 06/21/2020. The wound has been in treatment 22 weeks. The wound is located on the Right,Lateral Foot. The wound measures 1.1cm length x 1.3cm width x 0.3cm depth; 1.123cm^2 area and 0.337cm^3 volume. There is Fat Layer (Subcutaneous Tissue) exposed. There is no tunneling or undermining noted. There is a medium amount of serosanguineous drainage noted. The wound margin is well defined and not attached to the wound base. There is small (1-33%) pink granulation within the wound bed. There is a large (67-100%) amount of necrotic tissue within  the  wound bed including Adherent Slough. 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 There was a Chemical/Enzymatic/Mechanical debridement performed by [name], RN.Marland Kitchen Agent used was [employer]. There was no bleeding. The procedure was tolerated well. Post Debridement Measurements: 1.1cm length x 1.3cm width x 0.3cm depth; 0.337cm^3 volume. Character of Wound/Ulcer Post Debridement requires further debridement. Severity of Tissue Post Debridement is: Fat layer exposed. Post procedure Diagnosis Wound #3: Same as Pre-Procedure Plan Follow-up Appointments: Return Appointment in 1 week. - Dr. Dellia Nims Bathing/ Shower/ Hygiene: May shower and wash wound with soap and water. - with dressing changes only. Edema Control - Lymphedema / SCD / Other: Elevate legs to the level of the heart or above for 30 minutes daily and/or when sitting, a frequency of: - 3-4 times a day throughout the day. Moisturize legs daily. - every night before bed. Off-Loading: Turn and reposition every 2 hours Other: - float heels while resting in bed or chair. Use a pillow or rolled sheet under legs to offload the heels. Home Health: New wound care orders this week; continue Home Health for wound care. May utilize formulary equivalent dressing for wound treatment orders unless otherwise specified. Other Home Health Orders/Instructions: - [facility] home health three times a week and family to change all other  days. WOUND #1: - Calcaneus Wound Laterality: Left Cleanser: Soap and Water (Home Health) 2 x Per Week/30 Days Discharge Instructions: May shower and wash wound with dial antibacterial soap and water prior to dressing change. Cleanser: Wound Cleanser (Home Health) 2 x Per Week/30 Days Discharge Instructions: Cleanse the wound with wound cleanser prior to applying a clean dressing using gauze sponges, not tissue or cotton balls. Prim Dressing: PolyMem Silver Non-Adhesive Dressing, 4.25x4.25 in (Home Health) 2 x Per Week/30 Days ary Discharge Instructions: Apply to wound bed as instructed Secondary Dressing: Woven Gauze Sponge, Non-Sterile 4x4 in (Home Health) 2 x Per Week/30 Days Discharge Instructions: Apply over primary dressing as directed. Secondary Dressing: ALLEVYN Heel 4 1/2in x 5 1/2in / 10.5cm x 13.5cm (Home Health) 2 x Per Week/30 Days Discharge Instructions: Apply over primary dressing as directed. Secured With: [employer], 4.5x3.1 (in/yd) (Home Health) 2 x Per Week/30 Days Discharge Instructions: Secure with Kerlix as directed. Secured With: 83M Medipore H Soft Cloth Surgical T 4 x 2 (in/yd) (Home Health) 2 x Per Week/30 Days ape Discharge Instructions: Secure dressing with tape as directed. WOUND #3: - Foot Wound Laterality: Right, Lateral Cleanser: Soap and Water (Home Health) 1 x Per Day/30 Days Discharge Instructions: May shower and wash wound with dial antibacterial soap and water prior to dressing change. Cleanser: Wound Cleanser (Home Health) 1 x Per Day/30 Days Discharge Instructions: Cleanse the wound with wound cleanser prior to applying a clean dressing using gauze sponges, not tissue or cotton balls. Prim Dressing: Hydrofera Blue Classic Foam, 4x4 in (Home Health) 1 x Per Day/30 Days ary Discharge Instructions: Moisten with saline prior to applying to wound bed Prim Dressing: Santyl Ointment ([place]) 1 x Per Day/30 Days ary Discharge  Instructions: Apply  nickel thick amount to wound bed as instructed Secondary Dressing: Woven Gauze Sponge, Non-Sterile 4x4 in (Home Health) 1 x Per Day/30 Days Discharge Instructions: Apply over primary dressing as directed. Secured With: [employer], 4.5x3.1 (in/yd) (Home Health) 1 x Per Day/30 Days Discharge Instructions: Secure with Kerlix as directed. Secured With: 61M Medipore H Soft Cloth Surgical T 4 x 2 (in/yd) (Home Health) 1 x Per Day/30 Days ape Discharge Instructions: Secure dressing with tape as directed. #1 we put polymen on the heel which may be closed. 2. Continue with Santyl and Hydrofera Blue in the right lateral fifth met head. This may require further gentle debridement 3. He has follow-up arterial Doppler status post his recent intervention on the right on 12/19 Electronic Signature(s) Signed: 12/08/2020 4:05:05 PM By: Linton Ham MD Entered By: Linton Ham on 12/08/2020 08:55:18 -------------------------------------------------------------------------------- SuperBill Details Patient Name: Date of Service: [name] [name] HNA THA N L. 12/08/2020 Medical Record Number: [id number] Patient Account Number: [id number] Date of Birth/Sex: Treating RN: 1955-05-18 (65 y.o. [name], [name] Primary Care Provider: [name] Other Clinician: Referring Provider: Treating Provider/Extender: [name] in Treatment: 22 Diagnosis Coding ICD-10 Codes Code Description [phone] Type 2 diabetes mellitus with foot ulcer E11.51 Type 2 diabetes mellitus with diabetic peripheral angiopathy without gangrene L97.528 Non-pressure chronic ulcer of other part of left foot with other specified severity L97.511 Non-pressure chronic ulcer of other part of right foot limited to breakdown of skin E11.42 Type 2 diabetes mellitus with diabetic polyneuropathy Facility Procedures CPT4 Code: [id number] Description: 2 - DEBRIDE W/O ANES NON SELECT Modifier: 1 Quantity: Physician  Procedures : CPT4 Code Description Modifier [id number] - WC PHYS LEVEL 3 - EST PT ICD-10 Diagnosis Description E11.621 Type 2 diabetes mellitus with foot ulcer L97.511 Non-pressure chronic ulcer of other part of right foot limited to breakdown of skin L97.528  Non-pressure chronic ulcer of other part of left foot with other specified severity Quantity: 1 Electronic Signature(s) Signed: 12/08/2020 4:05:05 PM By: Linton Ham MD Entered By: Linton Ham on 12/08/2020 08:55:53

## 2020-12-08 NOTE — Progress Notes (Signed)
Romey, Gabrial L. ([id number]) Visit Report for 12/08/2020 Arrival Information Details Patient Name: Date of Service: FO STER, JO HNA THA N L. 12/08/2020 8:45 A M Medical Record Number: [id number] Patient Account Number: [id number] Date of Birth/Sex: Treating RN: 03/09/1955 (65 y.o. M) Breedlove, Lauren Primary Care : Jones, Thomas Other Clinician: Referring : Treating /Extender: Robson, Michael Jones, Thomas Weeks in Treatment: 22 Visit Information History Since Last Visit Added or deleted any medications: No Patient Arrived: Ambulatory Any new allergies or adverse reactions: No Arrival Time: 08:27 Had a fall or experienced change in No Accompanied By: wife activities of daily living that may affect Transfer Assistance: None risk of falls: Patient Identification Verified: Yes Signs or symptoms of abuse/neglect since last visito No Secondary Verification Process Completed: Yes Hospitalized since last visit: No Patient Requires Transmission-Based Precautions: No Implantable device outside of the clinic excluding No Patient Has Alerts: Yes cellular tissue based products placed in the center Patient Alerts: Patient on Blood Thinner since last visit: Has Dressing in Place as Prescribed: Yes Pain Present Now: No Electronic Signature(s) Signed: 12/08/2020 4:21:00 PM By: Breedlove, Lauren RN Entered By: Breedlove, Lauren on 12/08/2020 08:28:25 -------------------------------------------------------------------------------- Encounter Discharge Information Details Patient Name: Date of Service: FO STER, JO HNA THA N L. 12/08/2020 8:45 A M Medical Record Number: [id number] Patient Account Number: [id number] Date of Birth/Sex: Treating RN: 03/10/1955 (65 y.o. M) Deaton, Bobbi Primary Care : Jones, Thomas Other Clinician: Referring : Treating /Extender: Robson, Michael Jones, Thomas Weeks in Treatment: 22 Encounter Discharge Information  Items Post Procedure Vitals Discharge Condition: Stable Temperature (F): 98.5 Ambulatory Status: Wheelchair Pulse (bpm): 73 Discharge Destination: Home Respiratory Rate (breaths/min): 17 Transportation: Private Auto Blood Pressure (mmHg): 140/86 Accompanied By: mother Schedule Follow-up Appointment: Yes Clinical Summary of Care: Electronic Signature(s) Signed: 12/08/2020 3:55:54 PM By: Deaton, Bobbi RN, BSN Entered By: Deaton, Bobbi on 12/08/2020 08:42:54 -------------------------------------------------------------------------------- Lower Extremity Assessment Details Patient Name: Date of Service: FO STER, JO HNA THA N L. 12/08/2020 8:45 A M Medical Record Number: [id number] Patient Account Number: [id number] Date of Birth/Sex: Treating RN: 08/31/1955 (65 y.o. M) Breedlove, Lauren Primary Care : Jones, Thomas Other Clinician: Referring : Treating /Extender: Robson, Michael Jones, Thomas Weeks in Treatment: 22 Edema Assessment Assessed: [Left: Yes] [Right: Yes] Edema: [Left: Yes] [Right: No] Calf Left: Right: Point of Measurement: 34 cm From Medial Instep 29 cm 29.5 cm Ankle Left: Right: Point of Measurement: 10 cm From Medial Instep 21 cm 22 cm Vascular Assessment Pulses: Dorsalis Pedis Palpable: [Left:Yes] [Right:Yes] Posterior Tibial Palpable: [Left:Yes] [Right:Yes] Electronic Signature(s) Signed: 12/08/2020 4:21:00 PM By: Breedlove, Lauren RN Entered By: Breedlove, Lauren on 12/08/2020 08:29:21 -------------------------------------------------------------------------------- Multi Wound Chart Details Patient Name: Date of Service: FO STER, JO HNA THA N L. 12/08/2020 8:45 A M Medical Record Number: [id number] Patient Account Number: [id number] Date of Birth/Sex: Treating RN: 04/13/1955 (65 y.o. M) Primary Care : Jones, Thomas Other Clinician: Referring : Treating /Extender: Robson, Michael Jones, Thomas Weeks in  Treatment: 22 Vital Signs Height(in): 74 Pulse(bpm): 73 Weight(lbs): 151 Blood Pressure(mmHg): 140/86 Body Mass Index(BMI): 19 Temperature(°F): 98.5 Respiratory Rate(breaths/min): 17 Photos: [1:No Photos Left Calcaneus] [3:No Photos Right, Lateral Foot] [N/A:N/A N/A] Wound Location: [1:Pressure Injury] [3:Gradually Appeared] [N/A:N/A] Wounding Event: [1:Pressure Ulcer] [3:Diabetic Wound/Ulcer of the Lower] [N/A:N/A] Primary Etiology: [1:Coronary Artery Disease,] [3:Extremity Coronary Artery Disease,] [N/A:N/A] Comorbid History: [1:Hypertension, Type II Diabetes, Osteoarthritis, Neuropathy 05/23/2020] [3:Hypertension, Type II Diabetes, Osteoarthritis, Neuropathy 06/21/2020] [N/A:N/A] Date Acquired: [1:22] [3:22] [N/A:N/A] Weeks of Treatment: [1:Open] [3:Open] [N/A:N/A] Wound Status: [1:0.1x0.1x0.1] [  3:1.1x1.3x0.3] [N/A:N/A] Measurements L x W x D (cm) [1:0.008] [3:1.123] [N/A:N/A] A (cm) : rea [1:0.001] [3:0.337] [N/A:N/A] Volume (cm) : [1:99.90%] [3:43.90%] [N/A:N/A] % Reduction in A rea: [1:100.00%] [3:16.00%] [N/A:N/A] % Reduction in Volume: [1:Category/Stage IV] [3:Grade 2] [N/A:N/A] Classification: [1:Medium] [3:Medium] [N/A:N/A] Exudate A mount: [1:Serosanguineous] [3:Serosanguineous] [N/A:N/A] Exudate Type: [1:red, brown] [3:red, brown] [N/A:N/A] Exudate Color: [1:Distinct, outline attached] [3:Well defined, not attached] [N/A:N/A] Wound Margin: [1:Medium (34-66%)] [3:Small (1-33%)] [N/A:N/A] Granulation A mount: [1:Pink, Hyper-granulation, Friable] [3:Pink] [N/A:N/A] Granulation Quality: [1:Medium (34-66%)] [3:Large (67-100%)] [N/A:N/A] Necrotic A mount: [1:Fat Layer (Subcutaneous Tissue): Yes Fat Layer (Subcutaneous Tissue): Yes N/A] Exposed Structures: [1:Fascia: No Tendon: No Muscle: No Joint: No Bone: No Large (67-100%)] [3:Fascia: No Tendon: No Muscle: No Joint: No Bone: No Small (1-33%)] [N/A:N/A] Epithelialization: [1:N/A] [3:Chemical/Enzymatic/Mechanical]  [N/A:N/A] Debridement: [1:N/A] [3:N/A] [N/A:N/A] Instrument: [1:N/A] [3:None] [N/A:N/A] Bleeding: Debridement Treatment Response: N/A [3:Procedure was tolerated well] [N/A:N/A] Post Debridement Measurements L x N/A [3:1.1x1.3x0.3] [N/A:N/A] W x D (cm) [1:N/A] [3:0.337] [N/A:N/A] Post Debridement Volume: (cm) [1:N/A] [3:Debridement] [N/A:N/A] Treatment Notes Wound #1 (Calcaneus) Wound Laterality: Left Cleanser Soap and Water Discharge Instruction: May shower and wash wound with dial antibacterial soap and water prior to dressing change. Wound Cleanser Discharge Instruction: Cleanse the wound with wound cleanser prior to applying a clean dressing using gauze sponges, not tissue or cotton balls. Peri-Wound Care Topical Primary Dressing PolyMem Silver Non-Adhesive Dressing, 4.25x4.25 in Discharge Instruction: Apply to wound bed as instructed Secondary Dressing Woven Gauze Sponge, Non-Sterile 4x4 in Discharge Instruction: Apply over primary dressing as directed. ALLEVYN Heel 4 1/2in x 5 1/2in / 10.5cm x 13.5cm Discharge Instruction: Apply over primary dressing as directed. Secured With Kerlix Roll Sterile, 4.5x3.1 (in/yd) Discharge Instruction: Secure with Kerlix as directed. 3M Medipore H Soft Cloth Surgical T 4 x 2 (in/yd) ape Discharge Instruction: Secure dressing with tape as directed. Compression Wrap Compression Stockings Add-Ons Wound #3 (Foot) Wound Laterality: Right, Lateral Cleanser Soap and Water Discharge Instruction: May shower and wash wound with dial antibacterial soap and water prior to dressing change. Wound Cleanser Discharge Instruction: Cleanse the wound with wound cleanser prior to applying a clean dressing using gauze sponges, not tissue or cotton balls. Peri-Wound Care Topical Primary Dressing Hydrofera Blue Classic Foam, 4x4 in Discharge Instruction: Moisten with saline prior to applying to wound bed Santyl Ointment Discharge Instruction: Apply nickel  thick amount to wound bed as instructed Secondary Dressing Woven Gauze Sponge, Non-Sterile 4x4 in Discharge Instruction: Apply over primary dressing as directed. Secured With Kerlix Roll Sterile, 4.5x3.1 (in/yd) Discharge Instruction: Secure with Kerlix as directed. 3M Medipore H Soft Cloth Surgical T 4 x 2 (in/yd) ape Discharge Instruction: Secure dressing with tape as directed. Compression Wrap Compression Stockings Add-Ons Electronic Signature(s) Signed: 12/08/2020 4:05:05 PM By: Robson, Michael MD Entered By: Robson, Michael on 12/08/2020 08:51:18 -------------------------------------------------------------------------------- Multi-Disciplinary Care Plan Details Patient Name: Date of Service: FO STER, JO HNA THA N L. 12/08/2020 8:45 A M Medical Record Number: [id number] Patient Account Number: [id number] Date of Birth/Sex: Treating RN: 07/16/1955 (65 y.o. M) Deaton, Bobbi Primary Care : Jones, Thomas Other Clinician: Referring : Treating /Extender: Robson, Michael Jones, Thomas Weeks in Treatment: 22 Multidisciplinary Care Plan reviewed with physician Active Inactive Necrotic Tissue Nursing Diagnoses: Impaired tissue integrity related to necrotic/devitalized tissue Knowledge deficit related to management of necrotic/devitalized tissue Goals: Necrotic/devitalized tissue will be minimized in the wound bed Date Initiated: 09/22/2020 Target Resolution Date: 12/30/2020 Goal Status: Active Patient/caregiver will verbalize understanding of reason and process for debridement of   necrotic tissue Date Initiated: 09/22/2020 Date Inactivated: 10/06/2020 Target Resolution Date: 10/28/2020 Goal Status: Met Interventions: Provide education on necrotic tissue and debridement process Treatment Activities: Enzymatic debridement : 09/22/2020 Excisional debridement : 09/22/2020 Notes: Wound/Skin Impairment Nursing Diagnoses: Knowledge deficit related to  ulceration/compromised skin integrity Goals: Patient/caregiver will verbalize understanding of skin care regimen Date Initiated: 07/07/2020 Target Resolution Date: 12/30/2020 Goal Status: Active Interventions: Assess patient/caregiver ability to obtain necessary supplies Assess patient/caregiver ability to perform ulcer/skin care regimen upon admission and as needed Provide education on ulcer and skin care Treatment Activities: Skin care regimen initiated : 07/07/2020 Topical wound management initiated : 07/07/2020 Notes: Electronic Signature(s) Signed: 12/08/2020 3:55:54 PM By: [name] RN, BSN Entered By: [name] on 12/08/2020 08:37:32 -------------------------------------------------------------------------------- Pain Assessment Details Patient Name: Date of Service: [name] HNA THA N L. 12/08/2020 8:45 A M Medical Record Number: [id number] Patient Account Number: [id number] Date of Birth/Sex: Treating RN: 26-May-1955 (65 y.o. Burnadette Pop, Lauren Primary Care Michal Strzelecki: [name] Other Clinician: Referring Chesney Klimaszewski: Treating [name] [name]/Extender: [name] in Treatment: 22 Active Problems Location of Pain Severity and Description of Pain Patient Has Paino No Site Locations Pain Management and Medication Current Pain Management: Electronic Signature(s) Signed: 12/08/2020 4:21:00 PM By: Rhae Hammock RN Entered By: Rhae Hammock on 12/08/2020 08:28:51 -------------------------------------------------------------------------------- Patient/Caregiver Education Details Patient Name: Date of Service: [name] HNA THA N L. 12/8/2022andnbsp8:45 A M Medical Record Number: [id number] Patient Account Number: [id number] Date of Birth/Gender: Treating RN: 1955/09/17 (65 y.o. [name] Primary Care Physician: [name] Other Clinician: Referring Physician: Treating Physician/Extender: [name] in  Treatment: 75 Education Assessment Education Provided To: Patient Education Topics Provided Wound/Skin Impairment: Handouts: Skin Care Do's and Dont's Methods: Explain/Verbal Responses: Reinforcements needed Electronic Signature(s) Signed: 12/08/2020 3:55:54 PM By: [name] RN, BSN Entered By: [name] on 12/08/2020 08:38:01 -------------------------------------------------------------------------------- Wound Assessment Details Patient Name: Date of Service: [name] HNA THA N L. 12/08/2020 8:45 A M Medical Record Number: [id number] Patient Account Number: [id number] Date of Birth/Sex: Treating RN: 11/15/1955 (65 y.o. Burnadette Pop, Lauren Primary Care Tersea Aulds: [name] Other Clinician: Referring Javione Gunawan: Treating Briona Korpela/Extender: [name] in Treatment: 22 Wound Status Wound Number: 1 Primary Pressure Ulcer Etiology: Wound Location: Left Calcaneus Wound Open Wounding Event: Pressure Injury Status: Date Acquired: 05/23/2020 Comorbid Coronary Artery Disease, Hypertension, Type II Diabetes, Weeks Of Treatment: 22 History: Osteoarthritis, Neuropathy Clustered Wound: No Photos Photo Uploaded By: Levan Hurst on 12/08/2020 10:21:58 Wound Measurements Length: (cm) 0.1 Width: (cm) 0.1 Depth: (cm) 0.1 Area: (cm) 0.008 Volume: (cm) 0.001 % Reduction in Area: 99.9% % Reduction in Volume: 100% Epithelialization: Large (67-100%) Tunneling: No Undermining: No Wound Description Classification: Category/Stage IV Wound Margin: Distinct, outline attached Exudate Amount: Medium Exudate Type: Serosanguineous Exudate Color: red, brown Foul Odor After Cleansing: No Slough/Fibrino Yes Wound Bed Granulation Amount: Medium (34-66%) Exposed Structure Granulation Quality: Pink, Hyper-granulation, Friable Fascia Exposed: No Necrotic Amount: Medium (34-66%) Fat Layer (Subcutaneous Tissue) Exposed: Yes Necrotic Quality: Adherent  Slough Tendon Exposed: No Muscle Exposed: No Joint Exposed: No Bone Exposed: No Treatment Notes Wound #1 (Calcaneus) Wound Laterality: Left Cleanser Soap and Water Discharge Instruction: May shower and wash wound with dial antibacterial soap and water prior to dressing change. Wound Cleanser Discharge Instruction: Cleanse the wound with wound cleanser prior to applying a clean dressing using gauze sponges, not tissue or cotton balls. Peri-Wound Care Topical Primary Dressing PolyMem Silver Non-Adhesive Dressing, 4.25x4.25 in Discharge  Instruction: Apply to wound bed as instructed Secondary Dressing Woven Gauze Sponge, Non-Sterile 4x4 in Discharge Instruction: Apply over primary dressing as directed. ALLEVYN Heel 4 1/2in x 5 1/2in / 10.5cm x 13.5cm Discharge Instruction: Apply over primary dressing as directed. Secured With [employer], 4.5x3.1 (in/yd) Discharge Instruction: Secure with Kerlix as directed. 918M Medipore H Soft Cloth Surgical T 4 x 2 (in/yd) ape Discharge Instruction: Secure dressing with tape as directed. Compression Wrap Compression Stockings Add-Ons Electronic Signature(s) Signed: 12/08/2020 4:21:00 PM By: Rhae Hammock RN Entered By: Rhae Hammock on 12/08/2020 08:31:04 -------------------------------------------------------------------------------- Wound Assessment Details Patient Name: Date of Service: [name] HNA THA N L. 12/08/2020 8:45 A M Medical Record Number: [id number] Patient Account Number: [id number] Date of Birth/Sex: Treating RN: 21-Apr-1955 (65 y.o. Burnadette Pop, Lauren Primary Care Wendelin Reader: Other Clinician: [name] Referring Raizy Auzenne: Treating Kailynn Satterly/Extender: [name] in Treatment: 22 Wound Status Wound Number: 3 Primary Diabetic Wound/Ulcer of the Lower Extremity Etiology: Wound Location: Right, Lateral Foot Wound Open Wounding Event: Gradually Appeared Status: Date Acquired:  06/21/2020 Comorbid Coronary Artery Disease, Hypertension, Type II Diabetes, Weeks Of Treatment: 22 History: Osteoarthritis, Neuropathy Clustered Wound: No Photos Photo Uploaded By: Levan Hurst on 12/08/2020 10:21:59 Wound Measurements Length: (cm) 1.1 Width: (cm) 1.3 Depth: (cm) 0.3 Area: (cm) 1.123 Volume: (cm) 0.337 % Reduction in Area: 43.9% % Reduction in Volume: 16% Epithelialization: Small (1-33%) Tunneling: No Undermining: No Wound Description Classification: Grade 2 Wound Margin: Well defined, not attached Exudate Amount: Medium Exudate Type: Serosanguineous Exudate Color: red, brown Foul Odor After Cleansing: No Slough/Fibrino Yes Wound Bed Granulation Amount: Small (1-33%) Exposed Structure Granulation Quality: Pink Fascia Exposed: No Necrotic Amount: Large (67-100%) Fat Layer (Subcutaneous Tissue) Exposed: Yes Necrotic Quality: Adherent Slough Tendon Exposed: No Muscle Exposed: No Joint Exposed: No Bone Exposed: No Treatment Notes Wound #3 (Foot) Wound Laterality: Right, Lateral Cleanser Soap and Water Discharge Instruction: May shower and wash wound with dial antibacterial soap and water prior to dressing change. Wound Cleanser Discharge Instruction: Cleanse the wound with wound cleanser prior to applying a clean dressing using gauze sponges, not tissue or cotton balls. Peri-Wound Care Topical Primary Dressing Hydrofera Blue Classic Foam, 4x4 in Discharge Instruction: Moisten with saline prior to applying to wound bed Santyl Ointment Discharge Instruction: Apply nickel thick amount to wound bed as instructed Secondary Dressing Woven Gauze Sponge, Non-Sterile 4x4 in Discharge Instruction: Apply over primary dressing as directed. Secured With [employer], 4.5x3.1 (in/yd) Discharge Instruction: Secure with Kerlix as directed. 918M Medipore H Soft Cloth Surgical T 4 x 2 (in/yd) ape Discharge Instruction: Secure dressing with tape as  directed. Compression Wrap Compression Stockings Add-Ons Electronic Signature(s) Signed: 12/08/2020 4:21:00 PM By: Rhae Hammock RN Entered By: Rhae Hammock on 12/08/2020 08:31:36 -------------------------------------------------------------------------------- Vitals Details Patient Name: Date of Service: [name] HNA THA N L. 12/08/2020 8:45 A M Medical Record Number: [id number] Patient Account Number: [id number] Date of Birth/Sex: Treating RN: [birth year] (65 y.o. Burnadette Pop, Lauren Primary Care Giovonnie Trettel: [name] Other Clinician: Referring Ebb Carelock: Treating [place] Sigmund/Extender: [name] in Treatment: 22 Vital Signs Time Taken: 08:28 Temperature (F): 98.5 Height (in): 74 Pulse (bpm): 73 Weight (lbs): 151 Respiratory Rate (breaths/min): 17 Body Mass Index (BMI): 19.4 Blood Pressure (mmHg): 140/86 Reference Range: 80 - 120 mg / dl Electronic Signature(s) Signed: 12/08/2020 4:21:00 PM By: Rhae Hammock RN Entered By: Rhae Hammock on 12/08/2020 43:32:95

## 2020-12-10 ENCOUNTER — Emergency Department (HOSPITAL_BASED_OUTPATIENT_CLINIC_OR_DEPARTMENT_OTHER): Payer: HMO

## 2020-12-10 ENCOUNTER — Emergency Department (HOSPITAL_BASED_OUTPATIENT_CLINIC_OR_DEPARTMENT_OTHER)
Admission: EM | Admit: 2020-12-10 | Discharge: 2020-12-10 | Disposition: A | Discharge status: Home / Self Care | Payer: HMO | Attending: Emergency Medicine | Admitting: Emergency Medicine

## 2020-12-10 ENCOUNTER — Emergency Department (HOSPITAL_BASED_OUTPATIENT_CLINIC_OR_DEPARTMENT_OTHER): Payer: HMO | Admitting: Radiology

## 2020-12-10 ENCOUNTER — Encounter (HOSPITAL_BASED_OUTPATIENT_CLINIC_OR_DEPARTMENT_OTHER): Payer: Self-pay

## 2020-12-10 ENCOUNTER — Other Ambulatory Visit: Payer: Self-pay

## 2020-12-10 DIAGNOSIS — E1165 Type 2 diabetes mellitus with hyperglycemia: Secondary | ICD-10-CM | POA: Diagnosis not present

## 2020-12-10 DIAGNOSIS — E10319 Type 1 diabetes mellitus with unspecified diabetic retinopathy without macular edema: Secondary | ICD-10-CM | POA: Insufficient documentation

## 2020-12-10 DIAGNOSIS — E1065 Type 1 diabetes mellitus with hyperglycemia: Secondary | ICD-10-CM | POA: Insufficient documentation

## 2020-12-10 DIAGNOSIS — Z794 Long term (current) use of insulin: Secondary | ICD-10-CM | POA: Diagnosis not present

## 2020-12-10 DIAGNOSIS — R0781 Pleurodynia: Secondary | ICD-10-CM | POA: Diagnosis not present

## 2020-12-10 DIAGNOSIS — I2510 Atherosclerotic heart disease of native coronary artery without angina pectoris: Secondary | ICD-10-CM | POA: Diagnosis not present

## 2020-12-10 DIAGNOSIS — F17290 Nicotine dependence, other tobacco product, uncomplicated: Secondary | ICD-10-CM | POA: Insufficient documentation

## 2020-12-10 DIAGNOSIS — R7989 Other specified abnormal findings of blood chemistry: Secondary | ICD-10-CM | POA: Diagnosis not present

## 2020-12-10 DIAGNOSIS — I10 Essential (primary) hypertension: Secondary | ICD-10-CM | POA: Diagnosis not present

## 2020-12-10 DIAGNOSIS — R0789 Other chest pain: Secondary | ICD-10-CM

## 2020-12-10 DIAGNOSIS — Z7982 Long term (current) use of aspirin: Secondary | ICD-10-CM | POA: Insufficient documentation

## 2020-12-10 DIAGNOSIS — Z7901 Long term (current) use of anticoagulants: Secondary | ICD-10-CM | POA: Insufficient documentation

## 2020-12-10 DIAGNOSIS — Z951 Presence of aortocoronary bypass graft: Secondary | ICD-10-CM | POA: Insufficient documentation

## 2020-12-10 DIAGNOSIS — R739 Hyperglycemia, unspecified: Secondary | ICD-10-CM

## 2020-12-10 LAB — COMPREHENSIVE METABOLIC PANEL
ALT: 10 U/L (ref 0–44)
AST: 11 U/L — ABNORMAL LOW (ref 15–41)
Albumin: 4 g/dL (ref 3.5–5.0)
Alkaline Phosphatase: 87 U/L (ref 38–126)
Anion gap: 11 (ref 5–15)
BUN: 13 mg/dL (ref 8–23)
CO2: 30 mmol/L (ref 22–32)
Calcium: 9.7 mg/dL (ref 8.9–10.3)
Chloride: 88 mmol/L — ABNORMAL LOW (ref 98–111)
Creatinine, Ser: 0.72 mg/dL (ref 0.61–1.24)
GFR, Estimated: >60 mL/min (ref >60)
Glucose, Bld: 574 mg/dL (ref 70–99)
Potassium: 3.6 mmol/L (ref 3.5–5.1)
Sodium: 129 mmol/L — ABNORMAL LOW (ref 135–145)
Total Bilirubin: 0.7 mg/dL (ref 0.3–1.2)
Total Protein: 6.6 g/dL (ref 6.5–8.1)

## 2020-12-10 LAB — CBC WITH DIFFERENTIAL/PLATELET
Abs Immature Granulocytes: 0.02 10*3/uL (ref 0.00–0.07)
Basophils Absolute: 0.1 10*3/uL (ref 0.0–0.1)
Basophils Relative: 1 %
Eosinophils Absolute: 0.1 10*3/uL (ref 0.0–0.5)
Eosinophils Relative: 2 %
HCT: 37.7 % — ABNORMAL LOW (ref 39.0–52.0)
Hemoglobin: 13 g/dL (ref 13.0–17.0)
Immature Granulocytes: 0 %
Lymphocytes Relative: 29 %
Lymphs Abs: 1.4 10*3/uL (ref 0.7–4.0)
MCH: 31 pg (ref 26.0–34.0)
MCHC: 34.5 g/dL (ref 30.0–36.0)
MCV: 89.8 fL (ref 80.0–100.0)
Monocytes Absolute: 0.6 10*3/uL (ref 0.1–1.0)
Monocytes Relative: 12 %
Neutro Abs: 2.7 10*3/uL (ref 1.7–7.7)
Neutrophils Relative %: 56 %
Platelets: 187 10*3/uL (ref 150–400)
RBC: 4.2 MIL/uL — ABNORMAL LOW (ref 4.22–5.81)
RDW: 13 % (ref 11.5–15.5)
WBC: 4.9 10*3/uL (ref 4.0–10.5)
nRBC: 0 % (ref 0.0–0.2)

## 2020-12-10 LAB — TROPONIN I (HIGH SENSITIVITY): Troponin I (High Sensitivity): 11 ng/L (ref <18)

## 2020-12-10 LAB — D-DIMER, QUANTITATIVE: D-Dimer, Quant: 0.81 ug/mL-FEU — ABNORMAL HIGH (ref 0.00–0.50)

## 2020-12-10 LAB — LIPASE, BLOOD: Lipase: 12 U/L (ref 11–51)

## 2020-12-10 MED ORDER — LIDOCAINE 5 % EX PTCH
1.0000 | MEDICATED_PATCH | Freq: Once | CUTANEOUS | Status: DC
  Administered 2020-12-10: 1 via TRANSDERMAL
  Filled 2020-12-10: qty 1

## 2020-12-10 MED ORDER — FENTANYL CITRATE PF 50 MCG/ML IJ SOSY
50.0000 ug | PREFILLED_SYRINGE | Freq: Once | INTRAMUSCULAR | Status: AC
  Administered 2020-12-10: 50 ug via INTRAVENOUS
  Filled 2020-12-10: qty 1

## 2020-12-10 MED ORDER — LIDOCAINE 5 % EX PTCH: 1.0000 | MEDICATED_PATCH | CUTANEOUS | 0 refills | Status: DC

## 2020-12-10 MED ORDER — IOHEXOL 350 MG/ML SOLN
100.0000 mL | Freq: Once | INTRAVENOUS | Status: AC | PRN
  Administered 2020-12-10: 100 mL via INTRAVENOUS

## 2020-12-10 MED ORDER — LACTATED RINGERS IV BOLUS
1000.0000 mL | Freq: Once | INTRAVENOUS | Status: AC
  Administered 2020-12-10: 1000 mL via INTRAVENOUS

## 2020-12-10 MED ORDER — HYDROMORPHONE HCL 1 MG/ML IJ SOLN
0.5000 mg | Freq: Once | INTRAMUSCULAR | Status: AC
  Administered 2020-12-10: 0.5 mg via INTRAVENOUS
  Filled 2020-12-10: qty 1

## 2020-12-10 MED ORDER — IOHEXOL 300 MG/ML  SOLN: 100.0000 mL | Freq: Once | INTRAMUSCULAR | Status: DC | PRN

## 2020-12-10 NOTE — ED Provider Notes (Signed)
[facility] EMERGENCY DEPT Provider Note   CSN: [id number] Arrival date & time: 12/10/20  5364     History Chief Complaint  Patient presents with   Chest Pain    Left side    [name] [name] is a 65 y.o. male.  HPI 65 year old male presents with left-sided lower chest pain.  Is been going on for couple weeks and pretty much is a constant sensation.  Feels like a knife inside of him.  However 2 days ago it was way worse and while he does not that bad today it is still a little worse than when he first started.  He does not remember any specific injury or fall.  No shortness of breath.  Certain movements like sitting up or laying flat make it worse.  No abdominal pain. Has not taken anything for it specifically. Rates the pain as moderate.  Past Medical History:  Diagnosis Date   Anxiety    Arthritis    Cataract    Constipation    pt states goes EOD- hard stools    Coronary artery disease    Depression    Diabetes mellitus without complication ([place])    Diabetic retinopathy ([place])    DKA (diabetic ketoacidoses)    Hyperlipidemia    Hypertension    off meds   Hypertensive retinopathy    Psoriasis     Patient Active Problem List   Diagnosis Date Noted   Iron deficiency anemia secondary to inadequate dietary iron intake 07/14/2020   Diabetic ulcer of left foot associated with type 1 diabetes mellitus ([place]) 06/30/2020   Pressure injury of skin 05/24/2020   BPH with obstruction/lower urinary tract symptoms 03/16/2020   PAF (paroxysmal atrial fibrillation) ([place]) 03/16/2020   Type 1 diabetes mellitus with diabetic foot infection ([place]) 01/27/2020   Encounter for general adult medical examination with abnormal findings 01/13/2020   Hyperlipidemia LDL goal <70 01/13/2020   Benign prostatic hyperplasia without lower urinary tract symptoms 01/13/2020   PVD (peripheral vascular disease) ([place]) 01/13/2020   Coronary artery disease involving bypass graft of  transplanted heart without angina pectoris 01/13/2020   Paroxysmal atrial fibrillation ([place]) 01/13/2020   Recurrent major depressive disorder, in partial remission ([place]) 01/13/2020   Major depression, recurrent ([place]) 12/09/2019   Tear of lateral meniscus of knee 10/05/2019   Aortic dissection ([place]) 02/05/2019   Coronary artery disease 02/05/2019   Hypertension 02/05/2019   Psoriasis 02/05/2019   Vitamin D deficiency 01/07/2019   Uncontrolled type 2 diabetes mellitus with hyperglycemia ([place]) 01/07/2019   Microalbuminuria 01/07/2019   Depression 06/10/2018   Patellar tendonitis of right knee 04/24/2018   Peyronie's disease 06/09/2015   Erectile dysfunction 03/15/2015   S/P aortic aneurysm repair 05/19/2012   S/P CABG (coronary artery bypass graft) 05/19/2012   Non-proliferative diabetic retinopathy ([place]) 02/27/2011   Dupuytren's contracture of both hands 11/01/2010    Past Surgical History:  Procedure Laterality Date   ABDOMINAL AORTOGRAM W/LOWER EXTREMITY N/A 08/30/2020   Procedure: ABDOMINAL AORTOGRAM W/LOWER EXTREMITY;  Surgeon: Serafina Mitchell, MD;  Location: [facility];  Service: Cardiovascular;  Laterality: N/A;   ABDOMINAL AORTOGRAM W/LOWER EXTREMITY N/A 11/15/2020   Procedure: ABDOMINAL AORTOGRAM W/LOWER EXTREMITY;  Surgeon: Serafina Mitchell, MD;  Location: [facility];  Service: Cardiovascular;  Laterality: N/A;   ANKLE SURGERY     right   CARDIAC CATHETERIZATION  09/25/2001   COLONOSCOPY     ~10 yr ago- normal  per pt  CORONARY ARTERY BYPASS GRAFT  09/30/2001   CABG x 4   IR THORACENTESIS ASP PLEURAL SPACE W/IMG GUIDE  05/19/2020   LAPAROSCOPIC INGUINAL HERNIA REPAIR Bilateral 02/09/2010   MASS EXCISION Left 03/12/2013   Procedure: LEFT INDEX EXCISION MASS AND DEBRIDMENT DISTAL INTERPHALANGEAL JOINT;  Surgeon: Tennis Must, MD;  Location: [facility];  Service: Orthopedics;  Laterality: Left;   PERIPHERAL VASCULAR BALLOON ANGIOPLASTY Left  08/30/2020   Procedure: PERIPHERAL VASCULAR BALLOON ANGIOPLASTY;  Surgeon: Serafina Mitchell, MD;  Location: [facility];  Service: Cardiovascular;  Laterality: Left;  Peroneal       Family History  Problem Relation Age of Onset   Lymphoma Father    Cancer Paternal Grandmother        Colon Cancer   Colon cancer Paternal Grandmother    Colon polyps Neg Hx    Esophageal cancer Neg Hx    Rectal cancer Neg Hx    Stomach cancer Neg Hx    Diabetes Mellitus I Neg Hx     Social History   Tobacco Use   Smoking status: Some Days    Types: Cigars   Smokeless tobacco: Never  Vaping Use   Vaping Use: Never used  Substance Use Topics   Alcohol use: Yes    Comment: daily beer or scotch    Drug use: No    Home Medications Prior to Admission medications   Medication Sig Start Date End Date Taking? Authorizing Provider  lidocaine (LIDODERM) 5 % Place 1 patch onto the skin daily. Remove & Discard patch within 12 hours or as directed by MD 12/10/20  Yes Sherwood Gambler, MD  apixaban (ELIQUIS) 5 MG TABS tablet Take 1 tablet (5 mg total) by mouth 2 (two) times daily. 09/11/20   Janith Lima, MD  aspirin EC 325 MG tablet Take 325 mg by mouth daily.    [provider]  atorvastatin (LIPITOR) 40 MG tablet Take 1 tablet (40 mg total) by mouth daily. 08/31/20   Janith Lima, MD  B Complex Vitamins (B COMPLEX-B12 PO) Take 1 tablet by mouth daily.     [provider]  BD PEN NEEDLE NANO 2ND GEN 32G X 4 MM MISC USE FOR INSULIN 10/05/20   Janith Lima, MD  Blood Glucose Monitoring Suppl ([place]) w/Device KIT  10/07/20   [provider]  cephALEXin (KEFLEX) 500 MG capsule  07/25/20   [provider]  Cholecalciferol (VITAMIN D) 125 MCG (5000 UT) CAPS Take 5,000 Units by mouth daily.     [provider]  ciprofloxacin (CIPRO) 500 MG tablet Take 500 mg by mouth 2 (two) times daily. 10/18/20   [provider]  Continuous  Blood Gluc Sensor (DEXCOM G6 SENSOR) MISC 1 Device by Does not apply route See admin instructions. Change every 10 days 08/16/20   Renato Shin, MD  cyclobenzaprine (FLEXERIL) 5 MG tablet  07/25/20   [provider]  HYDROcodone-acetaminophen (NORCO/VICODIN) 5-325 MG tablet  07/25/20   [provider]  hydrOXYzine (ATARAX/VISTARIL) 10 MG tablet  07/25/20   [provider]  Insulin Aspart FlexPen (NOVOLOG) 100 UNIT/ML Inject 4 Units into the skin 2 (two) times daily with a meal. And pen needles 3/day 10/11/20   Renato Shin, MD  Insulin Disposable Pump (OMNIPOD 5 G6 INTRO, GEN 5,) KIT 1 Device by Does not apply route every 3 (three) days. 11/09/20   Renato Shin, MD  insulin glargine (LANTUS SOLOSTAR) 100  UNIT/ML Solostar Pen Inject 30 Units into the skin every morning. 10/11/20   Renato Shin, MD  Lancets [facility] DELICA PLUS [id number]) MISC Apply 1 each topically 4 (four) times daily. 10/05/20   [provider]  lisinopril (ZESTRIL) 5 MG tablet TAKE 1 TABLET BY MOUTH DAILY 08/04/19   Isaac Bliss, Rayford Halsted, MD  mirtazapine (REMERON) 15 MG tablet Take 1 tablet (15 mg total) by mouth at bedtime. 08/31/20   Janith Lima, MD  [facility] VERIO test strip 1 each 4 (four) times daily. 10/05/20   [provider]  PARoxetine (PAXIL) 20 MG tablet Take 1 tablet (20 mg total) by mouth daily. 08/31/20   Janith Lima, MD  SANTYL ointment Apply 1 application topically daily. 06/02/20   [provider]  traZODone (DESYREL) 50 MG tablet Take 1 tablet (50 mg total) by mouth at bedtime. 01/13/20   Janith Lima, MD    Allergies    Patient has no known allergies.  Review of Systems   Review of Systems  Respiratory:  Negative for cough and shortness of breath.   Cardiovascular:  Positive for chest pain and leg swelling (chronic, no unilateral swelling.).  Gastrointestinal:  Negative for abdominal pain.  All other systems reviewed and are  negative.  Physical Exam Updated Vital Signs BP (!) 183/91   Pulse 71   Temp (!) 97.5 F (36.4 C) (Oral)   Resp 13   Ht 6' 3" (1.905 m)   Wt 65.8 kg   SpO2 100%   BMI 18.12 kg/m   Physical Exam Vitals and nursing note reviewed.  Constitutional:      Appearance: He is well-developed.  HENT:     Head: Normocephalic and atraumatic.     Right Ear: External ear normal.     Left Ear: External ear normal.     Nose: Nose normal.  Eyes:     General:        Right eye: No discharge.        Left eye: No discharge.  Cardiovascular:     Rate and Rhythm: Normal rate and regular rhythm.     Heart sounds: Normal heart sounds.  Pulmonary:     Effort: Pulmonary effort is normal.     Breath sounds: Normal breath sounds.  Chest:     Chest wall: Tenderness present.    Abdominal:     Palpations: Abdomen is soft.     Tenderness: There is no abdominal tenderness.  Musculoskeletal:     Cervical back: Neck supple.     Comments: Mild lower extremity swelling  Skin:    General: Skin is warm and dry.  Neurological:     Mental Status: He is alert.  Psychiatric:        Mood and Affect: Mood is not anxious.    ED Results / Procedures / Treatments   Labs (all labs ordered are listed, but only abnormal results are displayed) Labs Reviewed  COMPREHENSIVE METABOLIC PANEL - Abnormal; Notable for the following components:      Result Value   Sodium 129 (*)    Chloride 88 (*)    Glucose, Bld 574 (*)    AST 11 (*)    All other components within normal limits  CBC WITH DIFFERENTIAL/PLATELET - Abnormal; Notable for the following components:   RBC 4.20 (*)    HCT 37.7 (*)    All other components within normal limits  D-DIMER, QUANTITATIVE - Abnormal; Notable for the following components:  D-Dimer, Quant 0.81 (*)    All other components within normal limits  LIPASE, BLOOD  TROPONIN I (HIGH SENSITIVITY)    EKG EKG Interpretation  Date/Time:  Saturday December 10 2020 09:50:16  EST Ventricular Rate:  72 PR Interval:  193 QRS Duration: 130 QT Interval:  414 QTC Calculation: 881 R Axis:   74 Text Interpretation: Sinus or ectopic atrial rhythm Nonspecific intraventricular conduction delay Anteroseptal infarct, age indeterminate nonspecific ST/T changes similar to May 2022 Confirmed by Sherwood Gambler [phone]) on 12/10/2020 10:28:43 AM  Radiology DG Ribs Unilateral W/Chest Left  Result Date: 12/10/2020 CLINICAL DATA:  Left lower rib pain without known injury. EXAM: LEFT RIBS AND CHEST - 3+ VIEW COMPARISON:  May 20, 2020. FINDINGS: No fracture or other bone lesions are seen involving the ribs. There is no evidence of pneumothorax or pleural effusion. Both lungs are clear. Heart size and mediastinal contours are within normal limits. Status post coronary bypass graft. IMPRESSION: No acute cardiopulmonary abnormality seen. No rib abnormality is noted. Electronically Signed   By: Marijo Conception M.D.   On: 12/10/2020 10:36   CT Angio Chest PE W and/or Wo Contrast  Result Date: 12/10/2020 CLINICAL DATA:  Pulmonary embolism (PE) suspected, positive D-dimer; left upper quadrant pain EXAM: CT ANGIOGRAPHY CHEST WITH CONTRAST TECHNIQUE: Multidetector CT imaging of the chest was performed using the standard protocol during bolus administration of intravenous contrast. Multiplanar CT image reconstructions and MIPs were obtained to evaluate the vascular anatomy. CONTRAST:  170m OMNIPAQUE IOHEXOL 350 MG/ML SOLN COMPARISON:  CT chest May 2022 FINDINGS: Cardiovascular: Satisfactory opacification of the pulmonary arteries to the segmental level. No evidence of pulmonary embolism. Normal heart size. Extensive coronary artery calcification. Post CABG. Thoracic aorta is normal in caliber with mild calcified plaque. No pericardial effusion. Mediastinum/Nodes: No adenopathy. Esophagus and thyroid are unremarkable. Lungs/Pleura: No consolidation or mass. No pleural effusion or pneumothorax. Upper  Abdomen: Distended stomach with ingested contents and air. Musculoskeletal: No acute osseous abnormality. Review of the MIP images confirms the above findings. IMPRESSION: No evidence of acute pulmonary embolism or other acute abnormality in the chest. Distended stomach with ingested contents and air. Electronically Signed   By: PMacy MisM.D.   On: 12/10/2020 11:40    Procedures Procedures   Medications Ordered in ED Medications  iohexol (OMNIPAQUE) 300 MG/ML solution 100 mL (has no administration in time range)  lidocaine (LIDODERM) 5 % 1 patch (has no administration in time range)  fentaNYL (SUBLIMAZE) injection 50 mcg (50 mcg Intravenous Given 12/10/20 1004)  HYDROmorphone (DILAUDID) injection 0.5 mg (0.5 mg Intravenous Given 12/10/20 1129)  lactated ringers bolus 1,000 mL (1,000 mLs Intravenous New Bag/Given 12/10/20 1129)  iohexol (OMNIPAQUE) 350 MG/ML injection 100 mL (100 mLs Intravenous Contrast Given 12/10/20 1114)    ED Course  I have reviewed the triage vital signs and the nursing notes.  Pertinent labs & imaging results that were available during my care of the patient were reviewed by me and considered in my medical decision making (see chart for details).    MDM Rules/Calculators/A&P                           Patient's chest wall pain is probably just muscular.  Unclear cause.  Initial x-ray is unremarkable.  Given age and comorbidities labs and ECG were obtained which do not show an obvious signs of acute coronary syndrome/myocardial injury.  D-dimer is elevated and otherwise low risk  PE but with no other clear cause I think CT is beneficial.  Does not show PE but also does not show any other obvious findings.  We will treat with topical medicine such as Lidoderm and refer back to PCP.  Return if symptoms worsen. Final Clinical Impression(s) / ED Diagnoses Final diagnoses:  Left-sided chest wall pain  Hyperglycemia    Rx / DC Orders ED Discharge Orders           Ordered    lidocaine (LIDODERM) 5 %  Every 24 hours        12/10/20 1214             Sherwood Gambler, MD 12/10/20 1221

## 2020-12-10 NOTE — ED Triage Notes (Signed)
Pain to left rib boarder for about three weeks.  Pain became worse on Thursday.  States worse pain he had.  Worse with deep breathing but not short of breath.  Denies cough.

## 2020-12-10 NOTE — Discharge Instructions (Addendum)
If you develop recurrent, continued, or worsening chest pain, shortness of breath, fever, vomiting, abdominal or back pain, or any other new/concerning symptoms then return to the ER for evaluation.  

## 2020-12-12 ENCOUNTER — Telehealth: Payer: Self-pay | Admitting: Endocrinology

## 2020-12-12 NOTE — Telephone Encounter (Signed)
Please add on tomorrow 1:45 PM

## 2020-12-13 ENCOUNTER — Encounter: Payer: HMO | Attending: Endocrinology | Admitting: Nutrition

## 2020-12-13 ENCOUNTER — Other Ambulatory Visit: Payer: Self-pay

## 2020-12-13 DIAGNOSIS — E1165 Type 2 diabetes mellitus with hyperglycemia: Secondary | ICD-10-CM | POA: Diagnosis not present

## 2020-12-14 ENCOUNTER — Telehealth: Payer: Self-pay | Admitting: Nutrition

## 2020-12-14 ENCOUNTER — Encounter (INDEPENDENT_AMBULATORY_CARE_PROVIDER_SITE_OTHER): Payer: HMO | Admitting: Ophthalmology

## 2020-12-14 ENCOUNTER — Encounter: Payer: HMO | Admitting: Nutrition

## 2020-12-14 DIAGNOSIS — F419 Anxiety disorder, unspecified: Secondary | ICD-10-CM | POA: Diagnosis not present

## 2020-12-14 DIAGNOSIS — F3341 Major depressive disorder, recurrent, in partial remission: Secondary | ICD-10-CM | POA: Diagnosis not present

## 2020-12-14 DIAGNOSIS — M47812 Spondylosis without myelopathy or radiculopathy, cervical region: Secondary | ICD-10-CM | POA: Diagnosis not present

## 2020-12-14 DIAGNOSIS — N401 Enlarged prostate with lower urinary tract symptoms: Secondary | ICD-10-CM | POA: Diagnosis not present

## 2020-12-14 DIAGNOSIS — M720 Palmar fascial fibromatosis [Dupuytren]: Secondary | ICD-10-CM | POA: Diagnosis not present

## 2020-12-14 DIAGNOSIS — Z7902 Long term (current) use of antithrombotics/antiplatelets: Secondary | ICD-10-CM | POA: Diagnosis not present

## 2020-12-14 DIAGNOSIS — G8929 Other chronic pain: Secondary | ICD-10-CM | POA: Diagnosis not present

## 2020-12-14 DIAGNOSIS — L97511 Non-pressure chronic ulcer of other part of right foot limited to breakdown of skin: Secondary | ICD-10-CM | POA: Diagnosis not present

## 2020-12-14 DIAGNOSIS — D649 Anemia, unspecified: Secondary | ICD-10-CM | POA: Diagnosis not present

## 2020-12-14 DIAGNOSIS — K5904 Chronic idiopathic constipation: Secondary | ICD-10-CM | POA: Diagnosis not present

## 2020-12-14 DIAGNOSIS — I10 Essential (primary) hypertension: Secondary | ICD-10-CM | POA: Diagnosis not present

## 2020-12-14 DIAGNOSIS — N138 Other obstructive and reflux uropathy: Secondary | ICD-10-CM | POA: Diagnosis not present

## 2020-12-14 DIAGNOSIS — S42102D Fracture of unspecified part of scapula, left shoulder, subsequent encounter for fracture with routine healing: Secondary | ICD-10-CM | POA: Diagnosis not present

## 2020-12-14 DIAGNOSIS — L97423 Non-pressure chronic ulcer of left heel and midfoot with necrosis of muscle: Secondary | ICD-10-CM | POA: Diagnosis not present

## 2020-12-14 DIAGNOSIS — M1990 Unspecified osteoarthritis, unspecified site: Secondary | ICD-10-CM | POA: Diagnosis not present

## 2020-12-14 DIAGNOSIS — I480 Paroxysmal atrial fibrillation: Secondary | ICD-10-CM | POA: Diagnosis not present

## 2020-12-14 DIAGNOSIS — E785 Hyperlipidemia, unspecified: Secondary | ICD-10-CM | POA: Diagnosis not present

## 2020-12-14 DIAGNOSIS — Z7982 Long term (current) use of aspirin: Secondary | ICD-10-CM | POA: Diagnosis not present

## 2020-12-14 DIAGNOSIS — E11621 Type 2 diabetes mellitus with foot ulcer: Secondary | ICD-10-CM | POA: Diagnosis not present

## 2020-12-14 DIAGNOSIS — Z7901 Long term (current) use of anticoagulants: Secondary | ICD-10-CM | POA: Diagnosis not present

## 2020-12-14 DIAGNOSIS — I2510 Atherosclerotic heart disease of native coronary artery without angina pectoris: Secondary | ICD-10-CM | POA: Diagnosis not present

## 2020-12-14 DIAGNOSIS — L409 Psoriasis, unspecified: Secondary | ICD-10-CM | POA: Diagnosis not present

## 2020-12-14 DIAGNOSIS — E1142 Type 2 diabetes mellitus with diabetic polyneuropathy: Secondary | ICD-10-CM | POA: Diagnosis not present

## 2020-12-14 DIAGNOSIS — E1151 Type 2 diabetes mellitus with diabetic peripheral angiopathy without gangrene: Secondary | ICD-10-CM | POA: Diagnosis not present

## 2020-12-14 NOTE — Progress Notes (Signed)
Patient is here with his mother, to be trained on the [place].  He could not remember his Apple ID, so was unable to start the dexcom.  We did link his OmniPod 5 to his podder's central account and to glooko, which linked to [place].   He reported after this, that he remembered his password, but did not want to set up the Dexcom account at this time.  I explained that this was needed before he starts on his pump.  He is aware that he can not start his pump tomorrow, if he does not start the Dexcom today.  He will return tomorrow for the Dexcom start.

## 2020-12-14 NOTE — Telephone Encounter (Signed)
Mother called canceling his appointment, saying that patient was unable to get his apple password set up on his phone. I called her back and rescheduled the Dexcom and pump start for 3 weeks, because Dr. Loanne Drilling is on vacation.

## 2020-12-14 NOTE — Patient Instructions (Signed)
Make sure you know what Your apple ID is before coming in tomorrow.

## 2020-12-15 ENCOUNTER — Encounter (HOSPITAL_BASED_OUTPATIENT_CLINIC_OR_DEPARTMENT_OTHER): Payer: HMO | Admitting: Internal Medicine

## 2020-12-15 NOTE — Progress Notes (Shared)
[facility] Note  12/19/2020     CHIEF COMPLAINT Patient presents for No chief complaint on file.  HISTORY OF PRESENT ILLNESS: [name] [name] is a 65 y.o. male who presents to the clinic today for:     Patient states vision still blurry OU.  Referring physician: Janith Lima, MD [place],  [place] [id number]  HISTORICAL INFORMATION:   Selected notes from the MEDICAL RECORD NUMBER Referred by Dr. [name] for concern of PDR OU   CURRENT MEDICATIONS: No current outpatient medications on file. (Ophthalmic Drugs)   No current facility-administered medications for this visit. (Ophthalmic Drugs)   Current Outpatient Medications (Other)  Medication Sig   apixaban (ELIQUIS) 5 MG TABS tablet Take 1 tablet (5 mg total) by mouth 2 (two) times daily.   aspirin EC 325 MG tablet Take 325 mg by mouth daily.   atorvastatin (LIPITOR) 40 MG tablet Take 1 tablet (40 mg total) by mouth daily.   B Complex Vitamins (B COMPLEX-B12 PO) Take 1 tablet by mouth daily.    BD PEN NEEDLE NANO 2ND GEN 32G X 4 MM MISC USE FOR INSULIN   Blood Glucose Monitoring Suppl (ONETOUCH VERIO FLEX SYSTEM) w/Device KIT    cephALEXin (KEFLEX) 500 MG capsule    Cholecalciferol (VITAMIN D) 125 MCG (5000 UT) CAPS Take 5,000 Units by mouth daily.    ciprofloxacin (CIPRO) 500 MG tablet Take 500 mg by mouth 2 (two) times daily.   Continuous Blood Gluc Sensor (DEXCOM G6 SENSOR) MISC 1 Device by Does not apply route See admin instructions. Change every 10 days   cyclobenzaprine (FLEXERIL) 5 MG tablet    HYDROcodone-acetaminophen (NORCO/VICODIN) 5-325 MG tablet    hydrOXYzine (ATARAX/VISTARIL) 10 MG tablet    Insulin Aspart FlexPen (NOVOLOG) 100 UNIT/ML Inject 4 Units into the skin 2 (two) times daily with a meal. And pen needles 3/day   Insulin Disposable Pump (OMNIPOD 5 G6 INTRO, GEN 5,) KIT 1 Device by Does not apply route every 3 (three) days.   insulin glargine  (LANTUS SOLOSTAR) 100 UNIT/ML Solostar Pen Inject 30 Units into the skin every morning.   Lancets (ONETOUCH DELICA PLUS [id number]) MISC Apply 1 each topically 4 (four) times daily.   lidocaine (LIDODERM) 5 % Place 1 patch onto the skin daily. Remove & Discard patch within 12 hours or as directed by MD   lisinopril (ZESTRIL) 5 MG tablet TAKE 1 TABLET BY MOUTH DAILY   mirtazapine (REMERON) 15 MG tablet Take 1 tablet (15 mg total) by mouth at bedtime.   ONETOUCH VERIO test strip 1 each 4 (four) times daily.   PARoxetine (PAXIL) 20 MG tablet Take 1 tablet (20 mg total) by mouth daily.   SANTYL ointment Apply 1 application topically daily.   traZODone (DESYREL) 50 MG tablet Take 1 tablet (50 mg total) by mouth at bedtime.   No current facility-administered medications for this visit. (Other)   REVIEW OF SYSTEMS:    ALLERGIES No Known Allergies  PAST MEDICAL HISTORY Past Medical History:  Diagnosis Date   Anxiety    Arthritis    Cataract    Constipation    pt states goes EOD- hard stools    Coronary artery disease    Depression    Diabetes mellitus without complication (HCC)    Diabetic retinopathy ([place])    DKA (diabetic ketoacidoses)    Hyperlipidemia    Hypertension    off meds   Hypertensive  retinopathy    Psoriasis    Past Surgical History:  Procedure Laterality Date   ABDOMINAL AORTOGRAM W/LOWER EXTREMITY N/A 08/30/2020   Procedure: ABDOMINAL AORTOGRAM W/LOWER EXTREMITY;  Surgeon: Serafina Mitchell, MD;  Location: [facility];  Service: Cardiovascular;  Laterality: N/A;   ABDOMINAL AORTOGRAM W/LOWER EXTREMITY N/A 11/15/2020   Procedure: ABDOMINAL AORTOGRAM W/LOWER EXTREMITY;  Surgeon: Serafina Mitchell, MD;  Location: [facility];  Service: Cardiovascular;  Laterality: N/A;   ANKLE SURGERY     right   CARDIAC CATHETERIZATION  09/25/2001   COLONOSCOPY     ~10 yr ago- normal  per pt    CORONARY ARTERY BYPASS GRAFT  09/30/2001   CABG x 4   IR THORACENTESIS ASP  PLEURAL SPACE W/IMG GUIDE  05/19/2020   LAPAROSCOPIC INGUINAL HERNIA REPAIR Bilateral 02/09/2010   MASS EXCISION Left 03/12/2013   Procedure: LEFT INDEX EXCISION MASS AND DEBRIDMENT DISTAL INTERPHALANGEAL JOINT;  Surgeon: Tennis Must, MD;  Location: [facility];  Service: Orthopedics;  Laterality: Left;   PERIPHERAL VASCULAR BALLOON ANGIOPLASTY Left 08/30/2020   Procedure: PERIPHERAL VASCULAR BALLOON ANGIOPLASTY;  Surgeon: Serafina Mitchell, MD;  Location: [facility];  Service: Cardiovascular;  Laterality: Left;  Peroneal    FAMILY HISTORY Family History  Problem Relation Age of Onset   Lymphoma Father    Cancer Paternal Grandmother        Colon Cancer   Colon cancer Paternal Grandmother    Colon polyps Neg Hx    Esophageal cancer Neg Hx    Rectal cancer Neg Hx    Stomach cancer Neg Hx    Diabetes Mellitus I Neg Hx     SOCIAL HISTORY Social History   Tobacco Use   Smoking status: Some Days    Types: Cigars   Smokeless tobacco: Never  Vaping Use   Vaping Use: Never used  Substance Use Topics   Alcohol use: Yes    Comment: daily beer or scotch    Drug use: No       OPHTHALMIC EXAM: Not recorded     IMAGING AND PROCEDURES  Imaging and Procedures for 12/19/2020           ASSESSMENT/PLAN:    ICD-10-CM   1. Proliferative diabetic retinopathy of both eyes with macular edema associated with type 2 diabetes mellitus ([place])  M57.8469     2. Retinal edema  H35.81     3. Essential hypertension  I10     4. Hypertensive retinopathy of both eyes  H35.033     5. Combined forms of age-related cataract of both eyes  H25.813        1. Severe proliferative diabetic retinopathy, both eyes  - s/p PRP OS (11.10.22) - A1c >15% on 10.11.22 - pt reports improved BG control with changes in insulin regimen, insulin pump being shipped - exam shows extensive neovascularization, fibrosis and hemorrhage OU -- slightly improved from prior - FA (11.09.22) shows  areas of vascular non-perfusion OU, +NVD OU and +NVE OU - OCT shows OD: mild interval improvement in IRF and vitreous opacities, OS: mild interval improvement in IRF, persistent sub-hyaloid heme, and vitreous opacities - discussed findings, severity of his disease, prognosis and likely need for extensive treatments -- PRP OU, multiple rounds of anti-VEGF therapy OU and possible surgery - recommend PRP OD today, 12.02.22 - pt wishes to proceed with laser - RBA of procedure discussed, questions answered - informed consent obtained and signed - see  procedure note - start PF QID OD x7 days - F/u 2 weeks DFE, OCT -- ?fill in PRP  2,3. Hypertensive retinopathy OU - discussed importance of tight BP control - monitor  4. Mixed Cataract OU - The symptoms of cataract, surgical options, and treatments and risks were discussed with patient. - discussed diagnosis and progression - not yet visually significant - monitor for now  Ophthalmic Meds Ordered this visit:  No orders of the defined types were placed in this encounter.     No follow-ups on file.  There are no Patient Instructions on file for this visit.  Explained the diagnoses, plan, and follow up with the patient and they expressed understanding.  Patient expressed understanding of the importance of proper follow up care.    This document serves as a record of services personally performed by [name], MD, PhD. It was created on their behalf by Roselee Nova, COMT. The creation of this record is the provider's dictation and/or activities during the visit.  Electronically signed by: Roselee Nova, COMT 12/15/20 10:04 AM  [name], M.D., Ph.D. Diseases & Surgery of the Retina and Vitreous [facility] 12/02/2020     Abbreviations: M myopia (nearsighted); A astigmatism; H hyperopia (farsighted); P presbyopia; Mrx spectacle prescription;  CTL contact lenses; OD right eye; OS left eye; OU both  eyes  XT exotropia; ET esotropia; PEK punctate epithelial keratitis; PEE punctate epithelial erosions; DES dry eye syndrome; MGD meibomian gland dysfunction; ATs artificial tears; PFAT's preservative free artificial tears; [place] nuclear sclerotic cataract; PSC posterior subcapsular cataract; ERM epi-retinal membrane; PVD posterior vitreous detachment; RD retinal detachment; DM diabetes mellitus; DR diabetic retinopathy; NPDR non-proliferative diabetic retinopathy; PDR proliferative diabetic retinopathy; CSME clinically significant macular edema; DME diabetic macular edema; dbh dot blot hemorrhages; CWS cotton wool spot; POAG primary open angle glaucoma; C/D cup-to-disc ratio; HVF humphrey visual field; GVF goldmann visual field; OCT optical coherence tomography; IOP intraocular pressure; BRVO Branch retinal vein occlusion; CRVO central retinal vein occlusion; CRAO central retinal artery occlusion; BRAO branch retinal artery occlusion; RT retinal tear; SB scleral buckle; PPV pars plana vitrectomy; VH Vitreous hemorrhage; PRP panretinal laser photocoagulation; IVK intravitreal kenalog; VMT vitreomacular traction; MH Macular hole;  NVD neovascularization of the disc; NVE neovascularization elsewhere; AREDS age related eye disease study; ARMD age related macular degeneration; POAG primary open angle glaucoma; EBMD epithelial/anterior basement membrane dystrophy; ACIOL anterior chamber intraocular lens; IOL intraocular lens; PCIOL posterior chamber intraocular lens; Phaco/IOL phacoemulsification with intraocular lens placement; [place] photorefractive keratectomy; LASIK laser assisted in situ keratomileusis; HTN hypertension; DM diabetes mellitus; COPD chronic obstructive pulmonary disease

## 2020-12-16 ENCOUNTER — Encounter (INDEPENDENT_AMBULATORY_CARE_PROVIDER_SITE_OTHER): Payer: HMO | Admitting: Ophthalmology

## 2020-12-19 ENCOUNTER — Encounter (HOSPITAL_COMMUNITY): Payer: HMO

## 2020-12-19 ENCOUNTER — Encounter (INDEPENDENT_AMBULATORY_CARE_PROVIDER_SITE_OTHER): Payer: HMO | Admitting: Ophthalmology

## 2020-12-20 ENCOUNTER — Encounter: Payer: HMO | Admitting: Nutrition

## 2020-12-22 ENCOUNTER — Encounter (HOSPITAL_BASED_OUTPATIENT_CLINIC_OR_DEPARTMENT_OTHER): Payer: HMO | Admitting: Internal Medicine

## 2020-12-25 DIAGNOSIS — S42102D Fracture of unspecified part of scapula, left shoulder, subsequent encounter for fracture with routine healing: Secondary | ICD-10-CM | POA: Diagnosis not present

## 2020-12-25 DIAGNOSIS — L89150 Pressure ulcer of sacral region, unstageable: Secondary | ICD-10-CM | POA: Diagnosis not present

## 2020-12-27 ENCOUNTER — Telehealth: Payer: Self-pay | Admitting: Nutrition

## 2020-12-27 NOTE — Telephone Encounter (Signed)
Patient's mother called and asked if you can send Dexcom prescription to [facility] on [address].  I am starting him on his pump next week and he needs this ASAP.  Thank you

## 2021-01-02 DIAGNOSIS — F3341 Major depressive disorder, recurrent, in partial remission: Secondary | ICD-10-CM | POA: Diagnosis not present

## 2021-01-02 DIAGNOSIS — Z7902 Long term (current) use of antithrombotics/antiplatelets: Secondary | ICD-10-CM | POA: Diagnosis not present

## 2021-01-02 DIAGNOSIS — F419 Anxiety disorder, unspecified: Secondary | ICD-10-CM | POA: Diagnosis not present

## 2021-01-02 DIAGNOSIS — M47812 Spondylosis without myelopathy or radiculopathy, cervical region: Secondary | ICD-10-CM | POA: Diagnosis not present

## 2021-01-02 DIAGNOSIS — I2510 Atherosclerotic heart disease of native coronary artery without angina pectoris: Secondary | ICD-10-CM | POA: Diagnosis not present

## 2021-01-02 DIAGNOSIS — I480 Paroxysmal atrial fibrillation: Secondary | ICD-10-CM | POA: Diagnosis not present

## 2021-01-02 DIAGNOSIS — M1990 Unspecified osteoarthritis, unspecified site: Secondary | ICD-10-CM | POA: Diagnosis not present

## 2021-01-02 DIAGNOSIS — E1151 Type 2 diabetes mellitus with diabetic peripheral angiopathy without gangrene: Secondary | ICD-10-CM | POA: Diagnosis not present

## 2021-01-02 DIAGNOSIS — I10 Essential (primary) hypertension: Secondary | ICD-10-CM | POA: Diagnosis not present

## 2021-01-02 DIAGNOSIS — L97511 Non-pressure chronic ulcer of other part of right foot limited to breakdown of skin: Secondary | ICD-10-CM | POA: Diagnosis not present

## 2021-01-02 DIAGNOSIS — Z7982 Long term (current) use of aspirin: Secondary | ICD-10-CM | POA: Diagnosis not present

## 2021-01-02 DIAGNOSIS — G8929 Other chronic pain: Secondary | ICD-10-CM | POA: Diagnosis not present

## 2021-01-02 DIAGNOSIS — D649 Anemia, unspecified: Secondary | ICD-10-CM | POA: Diagnosis not present

## 2021-01-02 DIAGNOSIS — E1142 Type 2 diabetes mellitus with diabetic polyneuropathy: Secondary | ICD-10-CM | POA: Diagnosis not present

## 2021-01-02 DIAGNOSIS — K5904 Chronic idiopathic constipation: Secondary | ICD-10-CM | POA: Diagnosis not present

## 2021-01-02 DIAGNOSIS — L409 Psoriasis, unspecified: Secondary | ICD-10-CM | POA: Diagnosis not present

## 2021-01-02 DIAGNOSIS — N401 Enlarged prostate with lower urinary tract symptoms: Secondary | ICD-10-CM | POA: Diagnosis not present

## 2021-01-02 DIAGNOSIS — N138 Other obstructive and reflux uropathy: Secondary | ICD-10-CM | POA: Diagnosis not present

## 2021-01-02 DIAGNOSIS — M720 Palmar fascial fibromatosis [Dupuytren]: Secondary | ICD-10-CM | POA: Diagnosis not present

## 2021-01-02 DIAGNOSIS — S42102D Fracture of unspecified part of scapula, left shoulder, subsequent encounter for fracture with routine healing: Secondary | ICD-10-CM | POA: Diagnosis not present

## 2021-01-02 DIAGNOSIS — E785 Hyperlipidemia, unspecified: Secondary | ICD-10-CM | POA: Diagnosis not present

## 2021-01-02 DIAGNOSIS — E11621 Type 2 diabetes mellitus with foot ulcer: Secondary | ICD-10-CM | POA: Diagnosis not present

## 2021-01-02 DIAGNOSIS — L97423 Non-pressure chronic ulcer of left heel and midfoot with necrosis of muscle: Secondary | ICD-10-CM | POA: Diagnosis not present

## 2021-01-02 DIAGNOSIS — Z7901 Long term (current) use of anticoagulants: Secondary | ICD-10-CM | POA: Diagnosis not present

## 2021-01-03 ENCOUNTER — Telehealth: Payer: Self-pay | Admitting: Endocrinology

## 2021-01-03 ENCOUNTER — Encounter: Payer: HMO | Attending: Pediatric Endocrinology | Admitting: Nutrition

## 2021-01-03 DIAGNOSIS — L089 Local infection of the skin and subcutaneous tissue, unspecified: Secondary | ICD-10-CM | POA: Diagnosis not present

## 2021-01-03 DIAGNOSIS — E10628 Type 1 diabetes mellitus with other skin complications: Secondary | ICD-10-CM | POA: Diagnosis not present

## 2021-01-03 DIAGNOSIS — E1065 Type 1 diabetes mellitus with hyperglycemia: Secondary | ICD-10-CM | POA: Insufficient documentation

## 2021-01-03 DIAGNOSIS — E1165 Type 2 diabetes mellitus with hyperglycemia: Secondary | ICD-10-CM | POA: Diagnosis not present

## 2021-01-03 DIAGNOSIS — E1042 Type 1 diabetes mellitus with diabetic polyneuropathy: Secondary | ICD-10-CM

## 2021-01-03 MED ORDER — DEXCOM G6 TRANSMITTER MISC: 1 refills | Status: DC

## 2021-01-03 NOTE — Progress Notes (Signed)
Patient is here with his mother and care giver to learn how to use the Dexcom G6 sensor.  WE discussed the difference between sensor readings and blood sugar readings, and when it is necessary to do a blood sugar reading.  He reported good understanding of this.   He was trained on how to insert, add the transmitter, and how to link the readings to his phone.  The readings were also linked to [place].  A sensor was Inserted into his right upper outer arm and the transmitter was linked to his phone.  He was encouraged to read the manual and to call the 800 number if questions. He will start his OmniPOd 5 pump next week.  He was told to stop the Lantus Tuesday morning before coming and he reported good understanding of this with no final questions.

## 2021-01-03 NOTE — Patient Instructions (Signed)
Read over manual Call 800 help line if questions or problems. Take no Lantus the morning you are starting on your pump

## 2021-01-03 NOTE — Telephone Encounter (Signed)
1.  Please schedule f/u appt 2.  Then please refill x 1, pending that appt.  

## 2021-01-03 NOTE — Telephone Encounter (Signed)
Patient has now been scheduled for 1/11 @1 :30 and rx for Dexcom Transmitter has now been sent in.

## 2021-01-03 NOTE — Telephone Encounter (Signed)
-----   Message from Ocie Doyne, RN sent at 01/03/2021  9:57 AM EST ----- Regarding: Dexcom G6 transmitter Please send in a script for the transmitter to his [facility].

## 2021-01-04 ENCOUNTER — Other Ambulatory Visit (HOSPITAL_COMMUNITY): Payer: Self-pay

## 2021-01-04 ENCOUNTER — Telehealth: Payer: Self-pay

## 2021-01-04 NOTE — Telephone Encounter (Signed)
Patient Advocate Encounter   Received notification from [facility] that prior authorization for Dexcom G6 Transmitter is required by his/her insurance [employer].   PA submitted on 01/04/21  Key#:  [id number] Status is pending    [facility] will continue to follow:  Patient Advocate Fax:  [phone]

## 2021-01-05 ENCOUNTER — Encounter (HOSPITAL_BASED_OUTPATIENT_CLINIC_OR_DEPARTMENT_OTHER): Payer: HMO | Attending: Internal Medicine | Admitting: Internal Medicine

## 2021-01-05 ENCOUNTER — Other Ambulatory Visit: Payer: Self-pay

## 2021-01-05 DIAGNOSIS — M1990 Unspecified osteoarthritis, unspecified site: Secondary | ICD-10-CM | POA: Insufficient documentation

## 2021-01-05 DIAGNOSIS — E1042 Type 1 diabetes mellitus with diabetic polyneuropathy: Secondary | ICD-10-CM | POA: Diagnosis not present

## 2021-01-05 DIAGNOSIS — L97511 Non-pressure chronic ulcer of other part of right foot limited to breakdown of skin: Secondary | ICD-10-CM | POA: Insufficient documentation

## 2021-01-05 DIAGNOSIS — I2510 Atherosclerotic heart disease of native coronary artery without angina pectoris: Secondary | ICD-10-CM | POA: Insufficient documentation

## 2021-01-05 DIAGNOSIS — Z951 Presence of aortocoronary bypass graft: Secondary | ICD-10-CM | POA: Diagnosis not present

## 2021-01-05 DIAGNOSIS — Z794 Long term (current) use of insulin: Secondary | ICD-10-CM | POA: Insufficient documentation

## 2021-01-05 DIAGNOSIS — E11621 Type 2 diabetes mellitus with foot ulcer: Secondary | ICD-10-CM | POA: Diagnosis not present

## 2021-01-05 DIAGNOSIS — E10621 Type 1 diabetes mellitus with foot ulcer: Secondary | ICD-10-CM | POA: Diagnosis not present

## 2021-01-05 DIAGNOSIS — E1051 Type 1 diabetes mellitus with diabetic peripheral angiopathy without gangrene: Secondary | ICD-10-CM | POA: Diagnosis not present

## 2021-01-05 DIAGNOSIS — L97512 Non-pressure chronic ulcer of other part of right foot with fat layer exposed: Secondary | ICD-10-CM | POA: Diagnosis not present

## 2021-01-05 NOTE — Progress Notes (Signed)
[name] [name] ([id number]) Visit Report for 01/05/2021 Debridement Details Patient Name: Date of Service: [name] [name] [facility] THA N L. 01/05/2021 7:30 A M Medical Record Number: [id number] Patient Account Number: [id number] Date of Birth/Sex: Treating RN: 02-06-1955 (66 y.o. [name] [name] Primary Care Provider: [name] Other Clinician: Referring Provider: Treating Provider/Extender: [name] in Treatment: 26 Debridement Performed for Assessment: Wound #3 Right,Lateral Foot Performed By: Physician Ricard Dillon., MD Debridement Type: Debridement Severity of Tissue Pre Debridement: Fat layer exposed Level of Consciousness (Pre-procedure): Awake and Alert Pre-procedure Verification/Time Out Yes - 08:10 Taken: Start Time: 08:11 Pain Control: Other : Benzocaine 20% T Area Debrided (L x W): otal 1 (cm) x 1 (cm) = 1 (cm) Tissue and other material debrided: Viable, Non-Viable, Slough, Subcutaneous, Skin: Dermis , Skin: Epidermis, Fibrin/Exudate, Slough Level: Skin/Subcutaneous Tissue Debridement Description: Excisional Instrument: Curette Bleeding: Moderate Hemostasis Achieved: Silver Nitrate End Time: 08:16 Procedural Pain: 0 Post Procedural Pain: 0 Response to Treatment: Procedure was tolerated well Level of Consciousness (Post- Awake and Alert procedure): Post Debridement Measurements of Total Wound Length: (cm) 0.9 Width: (cm) 1 Depth: (cm) 0.2 Volume: (cm) 0.141 Character of Wound/Ulcer Post Debridement: Requires Further Debridement Severity of Tissue Post Debridement: Fat layer exposed Post Procedure Diagnosis Same as Pre-procedure Electronic Signature(s) Signed: 01/05/2021 4:38:12 PM By: Linton Ham MD Signed: 01/05/2021 5:33:22 PM By: [name] RN, BSN Entered By: Linton Ham on 01/05/2021 08:25:06 -------------------------------------------------------------------------------- HPI Details Patient Name: Date of  Service: [name] [name] [facility] THA N L. 01/05/2021 7:30 A M Medical Record Number: [id number] Patient Account Number: [id number] Date of Birth/Sex: Treating RN: 1955-06-16 (66 y.o. [name] [name] Primary Care Provider: [name] Other Clinician: Referring Provider: Treating Provider/Extender: [name] in Treatment: 29 History of Present Illness HPI Description: ADMISSION 07/07/2020 This is a 66 year old man who is a type II diabetic. I have reviewed this in epic. It appeared that his diabetes was diagnosed in 2009 at which time he was on metformin. He was followed for a period of time by an endocrinologist with [facility] to always labeled him as a type II diabetic. Recently in epic there is been a switch to type 1 diabetes. If there were defining antibody test done I do not see them and I think the totality of the evidence would suggest that he is a type II diabetic. His recent problem started in May when he fell down 17 stairs. He suffered a scapular fracture he was hospitalized at [facility] for 9 days and then discharged to [facility]. His mother who is present said that most of these wounds seem to develop surrounding the discharge from hospital or the stay at [facility]. He has an area on his lower sacrum which appears currently to be a stage II but per their description this is actually improved quite a bit. He has an area on the left lateral foot at roughly the base of the fifth metatarsal, the left Achilles heel and the right fifth metatarsal head laterally they have been using topical antibiotics. Seen by primary care yesterday and placed on Keflex The patient had arterial studies on 05/05/2020. I wondered whether this had something to do with wounds on his feet at that time but I have not been able to determine this. At that time his ABI on the right was 1.09 with a TBI of 0.65 on the left 0.88 with a TBI of 0.44. Waveforms were triphasic on the right monophasic  on the  left. He likely has significant PAD on the left Past medical history includes type 2 diabetes poorly controlled last hemoglobin A1c at 12.7 in May, hypertension, hyperlipidemia, atrial fib on Eliquis, coronary artery disease status post CABG, left scapular fracture in May of this year, AAA repair. Bilateral leg weakness ABIs were not done in our clinic as they were just done in May of this year at [facility] 7/14; patient arrives back in clinic he has areas on the left Achilles heel, base of the left fifth metatarsal, right lateral fifth metatarsal. The area he had over the coccyx appears to be healed but this area remains vulnerable. We have been using Iodoflex. I rereviewed his ABIs which were done in May. I do not know exactly what prompted these test however they do show a TBI of 0.44 on the left and monophasic waveforms. 7/21; the patient's wounds are about the same. Certainly no healing and the nurses noted some green drainage on the right fifth metatarsal head, left Achilles a little larger. We have been using silver alginate really without any improvement. We did get an appointment with vascular surgery however its that it did the end of August and they seem to want to repeat the noninvasive test that they have already done in May. I am not sure what the issue here is. We advised the patient's sister to call and point to the idea that they have already had noninvasive studies. The real question I have is does this patient need an angiogram. He is a diabetic and it is possible that the ABIs are falsely elevated because of medial calcification. At least on the left his TBI was abnormal 08/08/2020 upon evaluation today patient appears to be doing about the same in regard to his wounds. He still waiting the appointment with vascular. This is something that hopefully should be undertaken shortly. The 29th of this month is when he is actually scheduled although they have put in a call to move  this up if at all possible. Fortunately there does not appear to be any signs of active infection at this time. No fevers, chills, nausea, vomiting, or diarrhea. 8/18; appointment with Dr. Trula Slade on 8/29. The patient has wounds on the left Achilles heel, left lateral foot and the right lateral fifth metatarsal head. We have been using Santyl under Hydrofera Blue. The surface of especially the left heel looks better to me than what I remember. 9/1; since the patient was last here he underwent angiography by Dr. Trula Slade he also underwent noninvasive studies first showing a ABI of of 0.64 with monophasic waveforms. His TBI was 0.35 with a left great toe pressure of 46 this is about the same pattern as we saw previously. The left was a lot worse than the right. He underwent an angiogram on 08/29/2020. He had multiple 80% peroneal artery stenosis which was his dominant vessel which was treated with angioplasty with no residual stenosis. 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 The anterior tibial artery was also occluded. An attempt was made at recanalization of the anterior tibial artery however this was unsuccessful. Noted that the peroneal artery reconstitutes the posterior tibial artery and the anterior tibial artery creating the plantar arch. The patient has areas on the Achilles left heel left lateral foot and right lateral fifth metatarsal head we have been using Santyl and Hydrofera Blue 9/22; since the patient was last here he went for repeat vascular studies. On the right his ABI  was 0.94 with biphasic waveforms TBI of 0.73 with a great toe pressure of 75. On the left which is the problematic wound his ABI was 1.08 but with biphasic waveforms at the PTA. Biphasic waveforms at the dorsalis pedis his TBI was 0.63 with a great toe pressure of 65. On the left however this does represent a fairly marked improvement. We have been using Santyl and Hydrofera Blue 9/29 left Achilles area. Still is  having necrotic surface. Bleeds very easily as the patient is on a combination of Eliquis aspirin and Plavix just stopped yesterday. I am not going to do any debridement until next week. We are using Santyl and Hydrofera Blue. His arterial status on the right was actually quite good. The area on the fifth metatarsal head needs additional debridement. A total contact cast in this area is not out of the question. He does describe claudication I believe with minimal activity. He is not very active X-ray we ordered last week is still pending in terms of the report 10/6 left Achilles area. In terms of the necrotic surface this is quite a bit better. There is still some debris in the posterior part of the wound however this looks better. Anteriorly the wound appears to be epithelialized there is still a divot in the middle of this but this does not probe to deep tissue. The area on the right fifth metatarsal head laterally really is not making as much progress as I would have liked. Still not completely a viable surface. Both the patient and his wife agree that he is not an external rotator in terms of ambulation [an outie]. We have been using Santyl and Hydrofera Blue for a prolonged period. On the left Achilles which looked like it is worse wound this seems to be making some progress not so much on the right foot. The patient and his wife state that they are rigorous about offloading both of these areas He was treated with angioplasty as I believe on the right peroneal artery that had multiple stenosis. This is a dominant vessel the anterior tibial and posterior tibial arteries are occluded however the foot is fed via collaterals from the peroneal. From my notes I am not able to understand the vascular supply on the left I will have to review this. 10/13; left Achilles top part of this looks healthy granulation in the bottom part of this wound again completely necrotic requiring debridement. The area on  the right lateral fifth met head also requires debridement. The majority of this is epithelialized however he she there is a refractory part to this. 10/20; left Achilles 100% better looking wound surface. Necrotic upper 50% seems to have resolved at least by inspection under illumination. Culture I did of this area last week postdebridement showed Proteus and Citrobacter. Both sensitive to quinolones and I put him on ciprofloxacin. We have been using silver alginate on the wound on the heel and Santyl and Hydrofera Blue on the right fifth met head. 10/27; left Achilles is measuring much smaller we are using topical gentamicin under silver alginate. Right fifth met head still fibrinous debris on the surface we are using Santyl and Hydrofera Blue. Patient lost his brother suddenly last week he was unable to keep his vascular surgery appointment. 11/3; left Achilles not as viable surface as I might like. Some hypergranulation. Right fifth metatarsal head still completely nonviable surface. It turns out that she was not using topical gentamicin on the left heel [mother] they have been  using Santyl and Hydrofera Blue on the right fifth metatarsal head. The patient did have noninvasive vascular studies on 10/ 28/22. He had triphasic waveforms up to the level of the proximal PTA and then a 50 to 74% stenosis in the mid PTA. Peroneal artery was triphasic as was the anterior tibial. He follows up with Dr. Trula Slade on 11/7. 11/17; since the patient was last here he saw Dr. Trula Slade and went for ANGIOGRAPHY on 11/15/2020. He has wounds on the left Achilles heel and the right lateral fifth metatarsal head. He has known PAD and had previous revascularization in August. On this occasion on the right his right common femoral, profundofemoral and superficial femoral and popliteal arteries were widely patent the dominant vessel is the posterior tibial which had several areas of stenosis greater than 80% he went  underwent angioplasty in this area. He had no interventions on the left however he is left common femoral, profundofemoral and superficial femoral arteries were widely patent as well as the popliteal he has recurrent stenosis greater than 50% within the peroneal artery which is a single- vessel runoff. The thought was that he may require further angiography and reangioplasty on the left peroneal artery. Overall the patient has severe PAD he has undergone angioplasty of the right posterior tibial artery. He arrives in clinic today with the Achilles heel area on the left surprisingly better with marked improvement in the surface area on the right not nearly as good but stable 12/8; patient has follow-up arterial Doppler studies on 12/19 at the [facility]. He is now being revascularized on both sides. Fortunately on the right Achilles area this is just about closed slight scabbing/eschar. I did not remove this today. The area on the right fifth met head looked better after last week's debridement we have been using Santyl and Hydrofera Blue 01/05/21 the patient has not been here in almost a month. He was supposed to have follow-up Doppler studies on 12/19 look these up. He has been revascularized on both sides The left Achilles area is closed he does not need to dress this but will need to protect this area on an ongoing basis likely indefinitely. He still has the area on the right fifth plantar metatarsal head. The area is measuring smaller he has a rim of epithelialization. The granulation tissue still a bit gelatinous for my liking Electronic Signature(s) Signed: 01/05/2021 4:38:12 PM By: Linton Ham MD Entered By: Linton Ham on 01/05/2021 08:26:34 -------------------------------------------------------------------------------- Physical Exam Details Patient Name: Date of Service: [name] [name] HNA THA N L. 01/05/2021 7:30 A M Medical Record Number: [id number] Patient Account Number:  [id number] Date of Birth/Sex: Treating RN: 31-Mar-1955 (66 y.o. [name] [name] Primary Care Provider: [name] Other Clinician: Referring Provider: Treating Provider/Extender: [name] in Treatment: 26 Constitutional Sitting or standing Blood Pressure is within target range for patient.. Pulse regular and within target range for patient.Marland Kitchen Respirations regular, non-labored and within target range.. Temperature is normal and within the target range for the patient.Marland Kitchen Appears in no distress. Notes Wound exam; left Achilles there is no open area here and we have healed this out. Right fifth metatarsal head plantar aspect. This actually looks smaller. Rim of epithelialization. The surface of this still has adherent gelatinous slough. I had to go into the subcutaneous layer with a #3 curette to remove this and get a healthy looking surface. Hemostasis with silver nitrate and a pressure dressing Electronic Signature(s) Signed: 01/05/2021 4:38:12 PM By: Dellia Nims,  Legrand Como MD Entered By: Linton Ham on 01/05/2021 98:92:11 -------------------------------------------------------------------------------- Physician Orders Details Patient Name: Date of Service: [name] [name] HNA THA N L. 01/05/2021 7:30 A M Medical Record Number: [id number] Patient Account Number: [id number] Date of Birth/Sex: Treating RN: 11-Jul-1955 (66 y.o. [name], [name] Primary Care Provider: [name] Other Clinician: Referring Provider: Treating Provider/Extender: [name] in Treatment: 26 Verbal / Phone Orders: No Diagnosis Coding ICD-10 Coding Code Description E11.621 Type 2 diabetes mellitus with foot ulcer E11.51 Type 2 diabetes mellitus with diabetic peripheral angiopathy without gangrene L97.528 Non-pressure chronic ulcer of other part of left foot with other specified severity L97.511 Non-pressure chronic ulcer of other part of right foot limited to breakdown  of skin E11.42 Type 2 diabetes mellitus with diabetic polyneuropathy Follow-up Appointments ppointment in 2 weeks. - Dr. Dellia Nims Return A ***Patient to pad/ protect to left the heel with gauze or padding and closely monitor for life.*** Bathing/ Shower/ Hygiene May shower and wash wound with soap and water. - with dressing changes only. Edema Control - Lymphedema / SCD / Other Elevate legs to the level of the heart or above for 30 minutes daily and/or when sitting, a frequency of: - 3-4 times a day throughout the day. Moisturize legs daily. - every night before bed. Off-Loading Turn and reposition every 2 hours Other: - float heels while resting in bed or chair. Use a pillow or rolled sheet under legs to offload the heels. Home Health New wound care orders this week; continue Home Health for wound care. May utilize formulary equivalent dressing for wound treatment orders unless otherwise specified. Other Home Health Orders/Instructions: - [facility] home health weekly changes and all other days family to change. Wound Treatment Wound #3 - Foot Wound Laterality: Right, Lateral Cleanser: Soap and Water [facility]) Every Other Day/30 Days Discharge Instructions: May shower and wash wound with dial antibacterial soap and water prior to dressing change. Cleanser: Wound Cleanser [facility]) Every Other Day/30 Days Discharge Instructions: Cleanse the wound with wound cleanser prior to applying a clean dressing using gauze sponges, not tissue or cotton balls. Prim Dressing: PolyMem Silver Non-Adhesive Dressing, 4.25x4.25 in [facility]) Every Other Day/30 Days ary Discharge Instructions: Apply to wound bed as instructed Secondary Dressing: Woven Gauze Sponge, Non-Sterile 4x4 in [facility]) Every Other Day/30 Days Discharge Instructions: Apply over primary dressing as directed. Secured With: [employer], 4.5x3.1 (in/yd) [facility]) Every Other Day/30 Days Discharge Instructions:  Secure with Kerlix as directed. Secured With: 54M Medipore H Soft Cloth Surgical T 4 x 2 (in/yd) (Home Health) Every Other Day/30 Days ape Discharge Instructions: Secure dressing with tape as directed. Electronic Signature(s) Signed: 01/05/2021 4:38:12 PM By: Linton Ham MD Signed: 01/05/2021 5:33:22 PM By: [name] RN, BSN Entered By: [name] on 01/05/2021 08:18:21 -------------------------------------------------------------------------------- Problem List Details Patient Name: Date of Service: [name] [name] HNA THA N L. 01/05/2021 7:30 A M Medical Record Number: [id number] Patient Account Number: [id number] Date of Birth/Sex: Treating RN: 1955-07-12 (66 y.o. [name] [name] Primary Care Provider: [name] Other Clinician: Referring Provider: Treating Provider/Extender: [name] in Treatment: 26 Active Problems ICD-10 Encounter Code Description Active Date MDM Diagnosis E11.621 Type 2 diabetes mellitus with foot ulcer 07/07/2020 No Yes E11.51 Type 2 diabetes mellitus with diabetic peripheral angiopathy without gangrene 09/01/2020 No Yes L97.511 Non-pressure chronic ulcer of other part of right foot limited to breakdown of 07/07/2020 No Yes skin E11.42 Type 2 diabetes mellitus  with diabetic polyneuropathy 07/07/2020 No Yes Inactive Problems ICD-10 Code Description Active Date Inactive Date L89.152 Pressure ulcer of sacral region, stage 2 07/07/2020 07/07/2020 Q65.784 Pressure ulcer of left heel, stage 2 07/07/2020 07/07/2020 L97.528 Non-pressure chronic ulcer of other part of left foot with other specified severity 07/07/2020 07/07/2020 Resolved Problems Electronic Signature(s) Signed: 01/05/2021 4:38:12 PM By: Linton Ham MD Entered By: Linton Ham on 01/05/2021 08:23:46 -------------------------------------------------------------------------------- Progress Note Details Patient Name: Date of Service: [name] [name] HNA THA N L. 01/05/2021 7:30 A  M Medical Record Number: [id number] Patient Account Number: [id number] Date of Birth/Sex: Treating RN: January 17, 1955 (66 y.o. [name] [name] Primary Care Provider: [name] Other Clinician: Referring Provider: Treating Provider/Extender: [name] in Treatment: 26 Subjective History of Present Illness (HPI) ADMISSION 07/07/2020 This is a 66 year old man who is a type II diabetic. I have reviewed this in epic. It appeared that his diabetes was diagnosed in 2009 at which time he was on metformin. He was followed for a period of time by an endocrinologist with [facility] to always labeled him as a type II diabetic. Recently in epic there is been a switch to type 1 diabetes. If there were defining antibody test done I do not see them and I think the totality of the evidence would suggest that he is a type II diabetic. His recent problem started in May when he fell down 17 stairs. He suffered a scapular fracture he was hospitalized at [facility] for 9 days and then discharged to [facility]. His mother who is present said that most of these wounds seem to develop surrounding the discharge from hospital or the stay at [facility]. He has an area on his lower sacrum which appears currently to be a stage II but per their description this is actually improved quite a bit. He has an area on the left lateral foot at roughly the base of the fifth metatarsal, the left Achilles heel and the right fifth metatarsal head laterally they have been using topical antibiotics. Seen by primary care yesterday and placed on Keflex The patient had arterial studies on 05/05/2020. I wondered whether this had something to do with wounds on his feet at that time but I have not been able to determine this. At that time his ABI on the right was 1.09 with a TBI of 0.65 on the left 0.88 with a TBI of 0.44. Waveforms were triphasic on the right monophasic on the left. He likely has significant PAD on the  left Past medical history includes type 2 diabetes poorly controlled last hemoglobin A1c at 12.7 in May, hypertension, hyperlipidemia, atrial fib on Eliquis, coronary artery disease status post CABG, left scapular fracture in May of this year, AAA repair. Bilateral leg weakness ABIs were not done in our clinic as they were just done in May of this year at [facility] 7/14; patient arrives back in clinic he has areas on the left Achilles heel, base of the left fifth metatarsal, right lateral fifth metatarsal. The area he had over the coccyx appears to be healed but this area remains vulnerable. We have been using Iodoflex. I rereviewed his ABIs which were done in May. I do not know exactly what prompted these test however they do show a TBI of 0.44 on the left and monophasic waveforms. 7/21; the patient's wounds are about the same. Certainly no healing and the nurses noted some green drainage on the right fifth metatarsal head, left Achilles a  little larger. We have been using silver alginate really without any improvement. We did get an appointment with vascular surgery however its that it did the end of August and they seem to want to repeat the noninvasive test that they have already done in May. I am not sure what the issue here is. We advised the patient's sister to call and point to the idea that they have already had noninvasive studies. The real question I have is does this patient need an angiogram. He is a diabetic and it is possible that the ABIs are falsely elevated because of medial calcification. At least on the left his TBI was abnormal 08/08/2020 upon evaluation today patient appears to be doing about the same in regard to his wounds. He still waiting the appointment with vascular. This is something that hopefully should be undertaken shortly. The 29th of this month is when he is actually scheduled although they have put in a call to move this up if at all possible. Fortunately  there does not appear to be any signs of active infection at this time. No fevers, chills, nausea, vomiting, or diarrhea. 8/18; appointment with Dr. Trula Slade on 8/29. The patient has wounds on the left Achilles heel, left lateral foot and the right lateral fifth metatarsal head. We have been using Santyl under Hydrofera Blue. The surface of especially the left heel looks better to me than what I remember. 9/1; since the patient was last here he underwent angiography by Dr. Trula Slade he also underwent noninvasive studies first showing a ABI of of 0.64 with monophasic waveforms. His TBI was 0.35 with a left great toe pressure of 46 this is about the same pattern as we saw previously. The left was a lot worse than the right. He underwent an angiogram on 08/29/2020. He had multiple 80% peroneal artery stenosis which was his dominant vessel which was treated with angioplasty with no residual stenosis. 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 The anterior tibial artery was also occluded. An attempt was made at recanalization of the anterior tibial artery however this was unsuccessful. Noted that the peroneal artery reconstitutes the posterior tibial artery and the anterior tibial artery creating the plantar arch. The patient has areas on the Achilles left heel left lateral foot and right lateral fifth metatarsal head we have been using Santyl and Hydrofera Blue 9/22; since the patient was last here he went for repeat vascular studies. On the right his ABI was 0.94 with biphasic waveforms TBI of 0.73 with a great toe pressure of 75. On the left which is the problematic wound his ABI was 1.08 but with biphasic waveforms at the PTA. Biphasic waveforms at the dorsalis pedis his TBI was 0.63 with a great toe pressure of 65. On the left however this does represent a fairly marked improvement. We have been using Santyl and Hydrofera Blue 9/29 left Achilles area. Still is having necrotic surface. Bleeds very  easily as the patient is on a combination of Eliquis aspirin and Plavix just stopped yesterday. I am not going to do any debridement until next week. We are using Santyl and Hydrofera Blue. His arterial status on the right was actually quite good. The area on the fifth metatarsal head needs additional debridement. A total contact cast in this area is not out of the question. He does describe claudication I believe with minimal activity. He is not very active X-ray we ordered last week is still pending in terms of the report 10/6 left  Achilles area. In terms of the necrotic surface this is quite a bit better. There is still some debris in the posterior part of the wound however this looks better. Anteriorly the wound appears to be epithelialized there is still a divot in the middle of this but this does not probe to deep tissue. The area on the right fifth metatarsal head laterally really is not making as much progress as I would have liked. Still not completely a viable surface. Both the patient and his wife agree that he is not an external rotator in terms of ambulation [an outie]. We have been using Santyl and Hydrofera Blue for a prolonged period. On the left Achilles which looked like it is worse wound this seems to be making some progress not so much on the right foot. The patient and his wife state that they are rigorous about offloading both of these areas He was treated with angioplasty as I believe on the right peroneal artery that had multiple stenosis. This is a dominant vessel the anterior tibial and posterior tibial arteries are occluded however the foot is fed via collaterals from the peroneal. From my notes I am not able to understand the vascular supply on the left I will have to review this. 10/13; left Achilles top part of this looks healthy granulation in the bottom part of this wound again completely necrotic requiring debridement. oo The area on the right lateral fifth met head  also requires debridement. The majority of this is epithelialized however he she there is a refractory part to this. 10/20; left Achilles 100% better looking wound surface. Necrotic upper 50% seems to have resolved at least by inspection under illumination. Culture I did of this area last week postdebridement showed Proteus and Citrobacter. Both sensitive to quinolones and I put him on ciprofloxacin. We have been using silver alginate on the wound on the heel and Santyl and Hydrofera Blue on the right fifth met head. 10/27; left Achilles is measuring much smaller we are using topical gentamicin under silver alginate. Right fifth met head still fibrinous debris on the surface we are using Santyl and Hydrofera Blue. Patient lost his brother suddenly last week he was unable to keep his vascular surgery appointment. 11/3; left Achilles not as viable surface as I might like. Some hypergranulation. Right fifth metatarsal head still completely nonviable surface. It turns out that she was not using topical gentamicin on the left heel [mother] they have been using Santyl and Hydrofera Blue on the right fifth metatarsal head. The patient did have noninvasive vascular studies on 10/ 28/22. He had triphasic waveforms up to the level of the proximal PTA and then a 50 to 74% stenosis in the mid PTA. Peroneal artery was triphasic as was the anterior tibial. He follows up with Dr. Trula Slade on 11/7. 11/17; since the patient was last here he saw Dr. Trula Slade and went for ANGIOGRAPHY on 11/15/2020. He has wounds on the left Achilles heel and the right lateral fifth metatarsal head. He has known PAD and had previous revascularization in August. On this occasion on the right his right common femoral, profundofemoral and superficial femoral and popliteal arteries were widely patent the dominant vessel is the posterior tibial which had several areas of stenosis greater than 80% he went underwent angioplasty in this area. He  had no interventions on the left however he is left common femoral, profundofemoral and superficial femoral arteries were widely patent as well as the popliteal he has recurrent stenosis  greater than 50% within the peroneal artery which is a single- vessel runoff. The thought was that he may require further angiography and reangioplasty on the left peroneal artery. Overall the patient has severe PAD he has undergone angioplasty of the right posterior tibial artery. He arrives in clinic today with the Achilles heel area on the left surprisingly better with marked improvement in the surface area on the right not nearly as good but stable 12/8; patient has follow-up arterial Doppler studies on 12/19 at the [facility]. He is now being revascularized on both sides. Fortunately on the right Achilles area this is just about closed slight scabbing/eschar. I did not remove this today. The area on the right fifth met head looked better after last week's debridement we have been using Santyl and Hydrofera Blue 01/05/21 the patient has not been here in almost a month. He was supposed to have follow-up Doppler studies on 12/19 look these up. He has been revascularized on both sides The left Achilles area is closed he does not need to dress this but will need to protect this area on an ongoing basis likely indefinitely. He still has the area on the right fifth plantar metatarsal head. The area is measuring smaller he has a rim of epithelialization. The granulation tissue still a bit gelatinous for my liking Objective Constitutional Sitting or standing Blood Pressure is within target range for patient.. Pulse regular and within target range for patient.Marland Kitchen Respirations regular, non-labored and within target range.. Temperature is normal and within the target range for the patient.Marland Kitchen Appears in no distress. Vitals Time Taken: 7:50 AM, Height: 74 in, Weight: 151 lbs, BMI: 19.4, Temperature: 98.8 F, Pulse: 62 bpm,  Respiratory Rate: 20 breaths/min, Blood Pressure: 111/72 mmHg. General Notes: Wound exam; left Achilles there is no open area here and we have healed this out. oo Right fifth metatarsal head plantar aspect. This actually looks smaller. Rim of epithelialization. The surface of this still has adherent gelatinous slough. I had to go into the subcutaneous layer with a #3 curette to remove this and get a healthy looking surface. Hemostasis with silver nitrate and a pressure dressing Integumentary (Hair, Skin) Wound #1 status is Healed - Epithelialized. Original cause of wound was Pressure Injury. The date acquired was: 05/23/2020. The wound has been in treatment 26 weeks. The wound is located on the Left Calcaneus. The wound measures 0cm length x 0cm width x 0cm depth; 0cm^2 area and 0cm^3 volume. There is Fat Layer (Subcutaneous Tissue) exposed. There is no tunneling or undermining noted. There is a none present amount of drainage noted. The wound margin is distinct with the outline attached to the wound base. There is no granulation within the wound bed. There is no necrotic tissue within the wound bed. Wound #3 status is Open. Original cause of wound was Gradually Appeared. The date acquired was: 06/21/2020. The wound has been in treatment 26 weeks. The wound is located on the Right,Lateral Foot. The wound measures 0.9cm length x 1cm width x 0.2cm depth; 0.707cm^2 area and 0.141cm^3 volume. There is Fat Layer (Subcutaneous Tissue) exposed. There is no tunneling or undermining noted. There is a medium amount of serosanguineous drainage noted. The wound margin is well defined and not attached to the wound base. There is small (1-33%) pink granulation within the wound bed. There is a large (67-100%) amount of necrotic tissue within the wound bed including Adherent Slough. General Notes: periwound macerated. 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 There was a Excisional Skin/Subcutaneous Tissue Debridement with a total area of 1 sq cm performed by Ricard Dillon., MD. With the following instrument(s): Curette to remove Viable and Non-Viable tissue/material. Material removed includes Subcutaneous Tissue, Slough, Skin: Dermis, Skin: Epidermis, and Fibrin/Exudate after achieving pain control using Other (Benzocaine 20%). A time out was conducted at 08:10, prior to the start of the procedure. A Moderate amount of bleeding was controlled with Silver Nitrate. The procedure was tolerated well with a pain level of 0 throughout and a pain level of 0 following the procedure. Post Debridement Measurements: 0.9cm length x 1cm width x 0.2cm depth; 0.141cm^3 volume. Character of Wound/Ulcer Post Debridement requires further debridement. Severity of Tissue Post Debridement is: Fat layer exposed. Post procedure Diagnosis Wound #3: Same as Pre-Procedure Plan Follow-up Appointments: Return Appointment in 2 weeks. - Dr. Dellia Nims ***Patient to pad/ protect to left the heel with gauze or padding and closely monitor for life.*** Bathing/ Shower/ Hygiene: May shower and wash wound with soap and water. - with dressing changes only. Edema Control - Lymphedema / SCD / Other: Elevate legs to the level of the heart or above for 30 minutes daily and/or when sitting, a frequency of: - 3-4 times a day throughout the day. Moisturize legs daily. - every night before bed. Off-Loading: Turn and reposition every 2 hours Other: - float heels while resting in bed or chair. Use a  pillow or rolled sheet under legs to offload the heels. Home Health: New wound care orders this week; continue Home Health for wound care. May utilize formulary equivalent dressing for wound treatment orders unless otherwise specified. Other Home Health Orders/Instructions: - [facility] home health weekly changes and all other days family to change. WOUND #3: - Foot Wound Laterality: Right, Lateral Cleanser: Soap and Water [facility]) Every Other Day/30 Days Discharge Instructions: May shower and wash wound with dial antibacterial soap and water prior to dressing change. Cleanser: Wound Cleanser [facility]) Every Other Day/30 Days Discharge Instructions: Cleanse the wound with wound cleanser prior to applying a clean dressing using gauze sponges, not tissue or cotton balls. Prim Dressing: PolyMem Silver Non-Adhesive Dressing, 4.25x4.25 in [facility]) Every Other Day/30 Days ary Discharge Instructions: Apply to wound bed as instructed Secondary Dressing: Woven Gauze Sponge, Non-Sterile 4x4 in [facility]) Every Other Day/30 Days Discharge Instructions: Apply over primary dressing as directed. Secured With: [employer], 4.5x3.1 (in/yd) [facility]) Every Other Day/30 Days Discharge Instructions: Secure with Kerlix as directed. Secured With: 22M Medipore H Soft Cloth Surgical T 4 x 2 (in/yd) (Home Health) Every Other Day/30 Days ape Discharge Instructions: Secure dressing with tape as directed. 1. The left heel is closed however he will need to rigorously offload this probably with a foam heel cup and continue this indefinitely. I warned him to not sleep with this against the mattress although he does have a bunny boot be careful with heeled shoes etc. 2. Right fifth plantar metatarsal head. I used a #3 curette to debride this to get to a healthy surface. We have been using Santyl and Hydrofera Blue for a prolonged  period and although the wound is measuring smaller I elected to  change to polymen silver change every 2 D 3. He has home health coming once a week Electronic Signature(s) Signed: 01/05/2021 4:38:12 PM By: Linton Ham MD Entered By: Linton Ham on 01/05/2021 08:29:51 -------------------------------------------------------------------------------- SuperBill Details Patient Name: Date of Service: [name] [name] HNA THA N L. 01/05/2021 Medical Record Number: [id number] Patient Account Number: [id number] Date of Birth/Sex: Treating RN: [id number] (66 y.o. [name], [name] Primary Care Provider: [name] Other Clinician: Referring Provider: Treating Provider/Extender: [name] in Treatment: 26 Diagnosis Coding ICD-10 Codes Code Description E11.621 Type 2 diabetes mellitus with foot ulcer E11.51 Type 2 diabetes mellitus with diabetic peripheral angiopathy without gangrene L97.528 Non-pressure chronic ulcer of other part of left foot with other specified severity L97.511 Non-pressure chronic ulcer of other part of right foot limited to breakdown of skin E11.42 Type 2 diabetes mellitus with diabetic polyneuropathy Facility Procedures CPT4 Code: [id number] Description: 54008 - DEB SUBQ TISSUE 20 SQ CM/< ICD-10 Diagnosis Description L97.511 Non-pressure chronic ulcer of other part of right foot limited to breakdown of Modifier: skin Quantity: 1 Physician Procedures : CPT4 Code Description Modifier [id number] - WC PHYS SUBQ TISS 20 SQ CM ICD-10 Diagnosis Description L97.511 Non-pressure chronic ulcer of other part of right foot limited to breakdown of skin Quantity: 1 Electronic Signature(s) Signed: 01/05/2021 4:38:12 PM By: Linton Ham MD Entered By: Linton Ham on 01/05/2021 08:30:01

## 2021-01-05 NOTE — Progress Notes (Signed)
[name], [name] ([id number]) Visit Report for 01/05/2021 Arrival Information Details Patient Name: Date of Service: [name] [name] [facility] THA N L. 01/05/2021 7:30 A M Medical Record Number: [id number] Patient Account Number: [id number] Date of Birth/Sex: Treating RN: 1955/10/18 (66 y.o. [name], [name] Primary Care Zainab Crumrine: [name] Other Clinician: Referring Kimyah Frein: Treating Parris Signer/Extender: [name] in Treatment: 32 Visit Information History Since Last Visit Added or deleted any medications: No Patient Arrived: Wheel Chair Any new allergies or adverse reactions: No Arrival Time: 07:50 Had a fall or experienced change in No Accompanied By: mother activities of daily living that may affect Transfer Assistance: Manual risk of falls: Patient Identification Verified: Yes Signs or symptoms of abuse/neglect since last visito No Secondary Verification Process Completed: Yes Hospitalized since last visit: No Patient Requires Transmission-Based Precautions: No Implantable device outside of the clinic excluding No Patient Has Alerts: Yes cellular tissue based products placed in the center Patient Alerts: Patient on Blood Thinner since last visit: Has Dressing in Place as Prescribed: Yes Pain Present Now: Yes Electronic Signature(s) Signed: 01/05/2021 5:33:22 PM By: [name] RN, BSN Entered By: [name] on 01/05/2021 07:52:36 -------------------------------------------------------------------------------- Encounter Discharge Information Details Patient Name: Date of Service: [name] [name] [facility] THA N L. 01/05/2021 7:30 A M Medical Record Number: [id number] Patient Account Number: [id number] Date of Birth/Sex: Treating RN: [birth year] (66 y.o. [name] [name] Primary Care Suri Tafolla: [name] Other Clinician: Referring Kaily Wragg: Treating Graviel Payeur/Extender: [name] in Treatment: 26 Encounter Discharge Information Items  Post Procedure Vitals Discharge Condition: Stable Temperature (F): 98.8 Ambulatory Status: Wheelchair Pulse (bpm): 62 Discharge Destination: Home Respiratory Rate (breaths/min): 20 Transportation: Private Auto Blood Pressure (mmHg): 111/72 Accompanied By: mother Schedule Follow-up Appointment: Yes Clinical Summary of Care: Electronic Signature(s) Signed: 01/05/2021 5:33:22 PM By: [name] RN, BSN Entered By: [name] on 01/05/2021 08:21:16 -------------------------------------------------------------------------------- Lower Extremity Assessment Details Patient Name: Date of Service: [name] [name] [facility] THA N L. 01/05/2021 7:30 A M Medical Record Number: [id number] Patient Account Number: [id number] Date of Birth/Sex: Treating RN: 08-18-1955 (66 y.o. [name] [name] Primary Care Arvella Massingale: [name] Other Clinician: Referring Shizuye Rupert: Treating Betheny Suchecki/Extender: [name] in Treatment: 26 Edema Assessment Assessed: [name]: Yes] [name]: Yes] Edema: [Left: Yes] [Right: Yes] Calf Left: Right: Point of Measurement: 34 cm From Medial Instep 31 cm 31 cm Ankle Left: Right: Point of Measurement: 10 cm From Medial Instep 21 cm 23 cm Vascular Assessment Pulses: Dorsalis Pedis Palpable: [Left:Yes] [Right:Yes] Electronic Signature(s) Signed: 01/05/2021 5:33:22 PM By: [name] RN, BSN Entered By: [name] on 01/05/2021 07:55:04 -------------------------------------------------------------------------------- Multi Wound Chart Details Patient Name: Date of Service: [name] [name] [facility] TH[facility] N L. 01/05/2021 7:30 A M Medical Record Number: [id number] Patient Account Number: [id number] Date of Birth/Sex: Treating RN: [birth year] (66 y.o. [name] [name] Primary Care Kazim Corrales: [name] Other Clinician: Referring Aariona Momon: Treating Ryler Laskowski/Extender: [name] in Treatment: 26 Vital Signs Height(in): 4 Pulse(bpm):  91 Weight(lbs): 151 Blood Pressure(mmHg): 111/72 Body Mass Index(BMI): 19 Temperature(F): 98.8 Respiratory Rate(breaths/min): 53 Photos: [1:Left Calcaneus] [3:Right, Lateral Foot] [N/A:N/A N/A] Wound Location: [1:Pressure Injury] [3:Gradually Appeared] [N/A:N/A] Wounding Event: [1:Pressure Ulcer] [3:Diabetic Wound/Ulcer of the Lower] [N/A:N/A] Primary Etiology: [1:Coronary Artery Disease,] [3:Extremity Coronary Artery Disease,] [N/A:N/A] Comorbid History: [1:Hypertension, Type II Diabetes, Osteoarthritis, Neuropathy 05/23/2020] [3:Hypertension, Type II Diabetes, Osteoarthritis, Neuropathy 06/21/2020] [N/A:N/A] Date Acquired: [1:26] [3:26] [N/A:N/A] Weeks of Treatment: [1:Healed - Epithelialized] [3:Open] [N/A:N/A] Wound Status: [1:0x0x0] [3:0.9x1x0.2] [N/A:N/A]  Measurements L x W x D (cm) [1:0] [0:7.622] [N/A:N/A] A (cm) : rea [1:0] [3:0.141] [N/A:N/A] Volume (cm) : [1:100.00%] [3:64.70%] [N/A:N/A] % Reduction in A [1:rea: 100.00%] [3:64.80%] [N/A:N/A] % Reduction in Volume: [1:Category/Stage IV] [3:Grade 2] [N/A:N/A] Classification: [1:None Present] [3:Medium] [N/A:N/A] Exudate A mount: [1:N/A] [3:Serosanguineous] [N/A:N/A] Exudate Type: [1:N/A] [3:red, brown] [N/A:N/A] Exudate Color: [1:Distinct, outline attached] [3:Well defined, not attached] [N/A:N/A] Wound Margin: [1:None Present (0%)] [3:Small (1-33%)] [N/A:N/A] Granulation A mount: [1:N/A] [3:Pink] [N/A:N/A] Granulation Quality: [1:None Present (0%)] [3:Large (67-100%)] [N/A:N/A] Necrotic A mount: [1:Fat Layer (Subcutaneous Tissue): Yes Fat Layer (Subcutaneous Tissue): Yes N/A] Exposed Structures: [1:Fascia: No Tendon: No Muscle: No Joint: No Bone: No Large (67-100%)] [3:Fascia: No Tendon: No Muscle: No Joint: No Bone: No Medium (34-66%)] [N/A:N/A] Epithelialization: [1:N/A] [3:Debridement - Excisional] [N/A:N/A] Debridement: Pre-procedure Verification/Time Out N/A [3:08:10] [N/A:N/A] Taken: [1:N/A] [3:Other]  [N/A:N/A] Pain Control: [1:N/A] [3:Subcutaneous, Slough] [N/A:N/A] Tissue Debrided: [1:N/A] [3:Skin/Subcutaneous Tissue] [N/A:N/A] Level: [1:N/A] [3:1] [N/A:N/A] Debridement A (sq cm): [1:rea N/A] [3:Curette] [N/A:N/A] Instrument: [1:N/A] [3:Moderate] [N/A:N/A] Bleeding: [1:N/A] [3:Silver Nitrate] [N/A:N/A] Hemostasis A chieved: [1:N/A] [3:0] [N/A:N/A] Procedural Pain: [1:N/A] [3:0] [N/A:N/A] Post Procedural Pain: [1:N/A] [3:Procedure was tolerated well] [N/A:N/A] Debridement Treatment Response: [1:N/A] [3:0.9x1x0.2] [N/A:N/A] Post Debridement Measurements L x W x D (cm) [1:N/A] [3:0.141] [N/A:N/A] Post Debridement Volume: (cm) [1:N/A] [3:periwound macerated.] [N/A:N/A] Assessment Notes: [1:N/A] [3:Debridement] [N/A:N/A] Treatment Notes Wound #3 (Foot) Wound Laterality: Right, Lateral Cleanser Soap and Water Discharge Instruction: May shower and wash wound with dial antibacterial soap and water prior to dressing change. Wound Cleanser Discharge Instruction: Cleanse the wound with wound cleanser prior to applying a clean dressing using gauze sponges, not tissue or cotton balls. Peri-Wound Care Topical Primary Dressing PolyMem Silver Non-Adhesive Dressing, 4.25x4.25 in Discharge Instruction: Apply to wound bed as instructed Secondary Dressing Woven Gauze Sponge, Non-Sterile 4x4 in Discharge Instruction: Apply over primary dressing as directed. Secured With [employer], 4.5x3.1 (in/yd) Discharge Instruction: Secure with Kerlix as directed. 71M Medipore H Soft Cloth Surgical T 4 x 2 (in/yd) ape Discharge Instruction: Secure dressing with tape as directed. Compression Wrap Compression Stockings Add-Ons Electronic Signature(s) Signed: 01/05/2021 4:38:12 PM By: Linton Ham MD Signed: 01/05/2021 5:33:22 PM By: [name] RN, BSN Entered By: Linton Ham on 01/05/2021  08:23:58 -------------------------------------------------------------------------------- Multi-Disciplinary Care Plan Details Patient Name: Date of Service: [name] [name] [facility] THA N L. 01/05/2021 7:30 A M Medical Record Number: [id number] Patient Account Number: [id number] Date of Birth/Sex: Treating RN: 1955-04-18 (66 y.o. [name] [name] Primary Care Audriella Blakeley: [name] Other Clinician: Referring Mason Burleigh: Treating Kally Cadden/Extender: [name] in Treatment: 37 Multidisciplinary Care Plan reviewed with physician Active Inactive Necrotic Tissue Nursing Diagnoses: Impaired tissue integrity related to necrotic/devitalized tissue Knowledge deficit related to management of necrotic/devitalized tissue Goals: Necrotic/devitalized tissue will be minimized in the wound bed Date Initiated: 09/22/2020 Target Resolution Date: 01/27/2021 Goal Status: Active Patient/caregiver will verbalize understanding of reason and process for debridement of necrotic tissue Date Initiated: 09/22/2020 Date Inactivated: 10/06/2020 Target Resolution Date: 10/28/2020 Goal Status: Met Interventions: Provide education on necrotic tissue and debridement process Treatment Activities: Enzymatic debridement : 09/22/2020 Excisional debridement : 09/22/2020 Notes: Wound/Skin Impairment Nursing Diagnoses: Knowledge deficit related to ulceration/compromised skin integrity Goals: Patient/caregiver will verbalize understanding of skin care regimen Date Initiated: 07/07/2020 Target Resolution Date: 01/27/2021 Goal Status: Active Interventions: Assess patient/caregiver ability to obtain necessary supplies Assess patient/caregiver ability to perform ulcer/skin care regimen upon admission and as needed Provide education on ulcer and skin care Treatment Activities: Skin  care regimen initiated : 07/07/2020 Topical wound management initiated : 07/07/2020 Notes: Electronic Signature(s) Signed:  01/05/2021 5:33:22 PM By: [name] RN, BSN Entered By: [name] on 01/05/2021 08:02:58 -------------------------------------------------------------------------------- Pain Assessment Details Patient Name: Date of Service: [name] [name] [facility] THA N L. 01/05/2021 7:30 A M Medical Record Number: [id number] Patient Account Number: [id number] Date of Birth/Sex: Treating RN: [birth year] (66 y.o. [name] [name] Primary Care Levonte Molina: [name] Other Clinician: Referring Kinney Sackmann: Treating Cecil Bixby/Extender: [name] in Treatment: 26 Active Problems Location of Pain Severity and Description of Pain Patient Has Paino Yes Site Locations Pain Location: Generalized Pain Rate the pain. Current Pain Level: 6 Worst Pain Level: 10 Least Pain Level: 0 Tolerable Pain Level: 8 Pain Management and Medication Current Pain Management: Medication: No Cold Application: No Rest: No Massage: No Activity: No T.E.N.S.: No Heat Application: No Leg drop or elevation: No Is the Current Pain Management Adequate: Adequate How does your wound impact your activities of daily livingo Sleep: No Bathing: No Appetite: No Relationship With Others: No Bladder Continence: No Emotions: No Bowel Continence: No Work: No Toileting: No Drive: No Dressing: No Hobbies: No Notes per patient chronic pain in knees no change. Electronic Signature(s) Signed: 01/05/2021 5:33:22 PM By: [name] RN, BSN Entered By: [name] on 01/05/2021 07:53:20 -------------------------------------------------------------------------------- Patient/Caregiver Education Details Patient Name: Date of Service: [name] [name] [facility] THA Haywood Filler 1/5/2023andnbsp7:30 A M Medical Record Number: [id number] Patient Account Number: [id number] Date of Birth/Gender: Treating RN: 05-05-1955 (66 y.o. [name] [name] Primary Care Physician: [name] Other Clinician: Referring Physician: Treating  Physician/Extender: [name] in Treatment: 60 Education Assessment Education Provided To: Patient and Caregiver mother Education Topics Provided Wound/Skin Impairment: Handouts: Skin Care Do's and Dont's Methods: Explain/Verbal Responses: Reinforcements needed Electronic Signature(s) Signed: 01/05/2021 5:33:22 PM By: [name] RN, BSN Entered By: [name] on 01/05/2021 08:04:16 -------------------------------------------------------------------------------- Wound Assessment Details Patient Name: Date of Service: [name] [name] [facility] THA N L. 01/05/2021 7:30 A M Medical Record Number: [id number] Patient Account Number: [id number] Date of Birth/Sex: Treating RN: Mar 24, 1955 (66 y.o. [name], [name] Primary Care Sherri Mcarthy: [name] Other Clinician: Referring Antoin Dargis: Treating Drevion Offord/Extender: [name] in Treatment: 26 Wound Status Wound Number: 1 Primary Pressure Ulcer Etiology: Wound Location: Left Calcaneus Wound Healed - Epithelialized Wounding Event: Pressure Injury Status: Date Acquired: 05/23/2020 Comorbid Coronary Artery Disease, Hypertension, Type II Diabetes, Weeks Of Treatment: 26 History: Osteoarthritis, Neuropathy Clustered Wound: No Photos Wound Measurements Length: (cm) Width: (cm) Depth: (cm) Area: (cm) Volume: (cm) 0 % Reduction in Area: 100% 0 % Reduction in Volume: 100% 0 Epithelialization: Large (67-100%) 0 Tunneling: No 0 Undermining: No Wound Description Classification: Category/Stage IV Wound Margin: Distinct, outline attached Exudate Amount: None Present Foul Odor After Cleansing: No Slough/Fibrino Yes Wound Bed Granulation Amount: None Present (0%) Exposed Structure Necrotic Amount: None Present (0%) Fascia Exposed: No Fat Layer (Subcutaneous Tissue) Exposed: Yes Tendon Exposed: No Muscle Exposed: No Joint Exposed: No Bone Exposed: No Electronic Signature(s) Signed:  01/05/2021 5:33:22 PM By: [name] RN, BSN Signed: 01/05/2021 5:42:44 PM By: Baruch Gouty RN, BSN Entered By: Baruch Gouty on 01/05/2021 07:58:49 -------------------------------------------------------------------------------- Wound Assessment Details Patient Name: Date of Service: [name] [name] [facility] THA N L. 01/05/2021 7:30 A M Medical Record Number: [id number] Patient Account Number: [id number] Date of Birth/Sex: Treating RN: 22-Jun-1955 (66 y.o. [name] [name] Primary Care Honesty Menta: [name] Other Clinician: Referring Aziya Arena: Treating Laronica Bhagat/Extender: Dellia Nims  Kyla Balzarine in Treatment: 26 Wound Status Wound Number: 3 Primary Diabetic Wound/Ulcer of the Lower Extremity Etiology: Wound Location: Right, Lateral Foot Wound Open Wounding Event: Gradually Appeared Status: Date Acquired: 06/21/2020 Comorbid Coronary Artery Disease, Hypertension, Type II Diabetes, Weeks Of Treatment: 26 History: Osteoarthritis, Neuropathy Clustered Wound: No Photos Wound Measurements Length: (cm) 0.9 Width: (cm) 1 Depth: (cm) 0.2 Area: (cm) 0.707 Volume: (cm) 0.141 % Reduction in Area: 64.7% % Reduction in Volume: 64.8% Epithelialization: Medium (34-66%) Tunneling: No Undermining: No Wound Description Classification: Grade 2 Wound Margin: Well defined, not attached Exudate Amount: Medium Exudate Type: Serosanguineous Exudate Color: red, brown Foul Odor After Cleansing: No Slough/Fibrino Yes Wound Bed Granulation Amount: Small (1-33%) Exposed Structure Granulation Quality: Pink Fascia Exposed: No Necrotic Amount: Large (67-100%) Fat Layer (Subcutaneous Tissue) Exposed: Yes Necrotic Quality: Adherent Slough Tendon Exposed: No Muscle Exposed: No Joint Exposed: No Bone Exposed: No Assessment Notes periwound macerated. Treatment Notes Wound #3 (Foot) Wound Laterality: Right, Lateral Cleanser Soap and Water Discharge Instruction: May shower and wash  wound with dial antibacterial soap and water prior to dressing change. Wound Cleanser Discharge Instruction: Cleanse the wound with wound cleanser prior to applying a clean dressing using gauze sponges, not tissue or cotton balls. Peri-Wound Care Topical Primary Dressing PolyMem Silver Non-Adhesive Dressing, 4.25x4.25 in Discharge Instruction: Apply to wound bed as instructed Secondary Dressing Woven Gauze Sponge, Non-Sterile 4x4 in Discharge Instruction: Apply over primary dressing as directed. Secured With [employer], 4.5x3.1 (in/yd) Discharge Instruction: Secure with Kerlix as directed. 65M Medipore H Soft Cloth Surgical T 4 x 2 (in/yd) ape Discharge Instruction: Secure dressing with tape as directed. Compression Wrap Compression Stockings Add-Ons Electronic Signature(s) Signed: 01/05/2021 5:33:22 PM By: [name] RN, BSN Signed: 01/05/2021 5:42:44 PM By: Baruch Gouty RN, BSN Entered By: Baruch Gouty on 01/05/2021 07:59:25 -------------------------------------------------------------------------------- Vitals Details Patient Name: Date of Service: [name] [name] [facility] THA N L. 01/05/2021 7:30 A M Medical Record Number: [id number] Patient Account Number: [id number] Date of Birth/Sex: Treating RN: [birth year] (66 y.o. [name], [name] Primary Care Jazline Cumbee: [name] Other Clinician: Referring Desi Carby: Treating Hutton Pellicane/Extender: [name] in Treatment: 26 Vital Signs Time Taken: 07:50 Temperature (F): 98.8 Height (in): 74 Pulse (bpm): 62 Weight (lbs): 151 Respiratory Rate (breaths/min): 20 Body Mass Index (BMI): 19.4 Blood Pressure (mmHg): 111/72 Reference Range: 80 - 120 mg / dl Electronic Signature(s) Signed: 01/05/2021 5:33:22 PM By: [name] RN, BSN Entered By: [name] on 01/05/2021 07:52:52

## 2021-01-05 NOTE — Telephone Encounter (Signed)
Message sent thru MyChart to see what all the pt is needing for the [facility]

## 2021-01-10 ENCOUNTER — Other Ambulatory Visit (HOSPITAL_COMMUNITY): Payer: Self-pay

## 2021-01-10 ENCOUNTER — Other Ambulatory Visit: Payer: Self-pay

## 2021-01-10 ENCOUNTER — Telehealth: Payer: Self-pay

## 2021-01-10 ENCOUNTER — Encounter: Payer: HMO | Admitting: Nutrition

## 2021-01-10 DIAGNOSIS — E1165 Type 2 diabetes mellitus with hyperglycemia: Secondary | ICD-10-CM

## 2021-01-10 NOTE — Telephone Encounter (Signed)
Patient Advocate Encounter  Prior Authorization for [employer] has been approved.    PA# [id number]  Effective dates: 01/06/21 till ???  RTS  Spoke with Pharmacy to Process.  Patient Advocate Fax: [phone]

## 2021-01-10 NOTE — Telephone Encounter (Signed)
Patient came in today and had appointment with Vaughan Basta to start a new pump. Per Vaughan Basta patient needs to see you this coming Friday. You do not have any appts for the next couple of weeks. Please advise.

## 2021-01-11 ENCOUNTER — Ambulatory Visit: Payer: HMO | Admitting: Endocrinology

## 2021-01-12 ENCOUNTER — Telehealth: Payer: Self-pay | Admitting: Nutrition

## 2021-01-12 ENCOUNTER — Encounter: Payer: Self-pay | Admitting: Endocrinology

## 2021-01-12 ENCOUNTER — Other Ambulatory Visit: Payer: Self-pay

## 2021-01-12 ENCOUNTER — Telehealth: Payer: Self-pay | Admitting: Dietician

## 2021-01-12 ENCOUNTER — Ambulatory Visit (INDEPENDENT_AMBULATORY_CARE_PROVIDER_SITE_OTHER): Payer: HMO | Admitting: Endocrinology

## 2021-01-12 VITALS — BP 144/76 | HR 63 | Ht 75.0 in | Wt 156.2 lb

## 2021-01-12 DIAGNOSIS — E1142 Type 2 diabetes mellitus with diabetic polyneuropathy: Secondary | ICD-10-CM | POA: Diagnosis not present

## 2021-01-12 DIAGNOSIS — E1165 Type 2 diabetes mellitus with hyperglycemia: Secondary | ICD-10-CM | POA: Diagnosis not present

## 2021-01-12 DIAGNOSIS — E1042 Type 1 diabetes mellitus with diabetic polyneuropathy: Secondary | ICD-10-CM

## 2021-01-12 LAB — POCT GLYCOSYLATED HEMOGLOBIN (HGB A1C): Hemoglobin A1C: 7.1 % — AB (ref 4.0–5.6)

## 2021-01-12 NOTE — Progress Notes (Signed)
Subjective:    Patient ID: [name] [name], male    DOB: [birth year], 66 y.o.   MRN: [id number]  HPI Pt returns for f/u of diabetes mellitus: DM type: Insulin-requiring type 2 (due to lean body habitus: he is at risk for evolving type 1) Dx'ed: 2011 Complications: GP, CAD, PN, PAD, foot ulcer, and DR Therapy: insulin since 2013 DKA: never Severe hypoglycemia: never Pancreatitis: never Pancreatic imaging: normal on 2020 CT SDOH: Sister provides some hx, due to pt's poor health and memory loss; Other: He does not use continuous glucose monitor; he stopped multiple daily injections, due to missing doses.   Interval history:  I reviewed continuous glucose monitor data.  Glucose varies from 220-350  it increases 10AM-6PM.  There is no trend throughout the day.  He takes these pump settings: Basal rate: 0.5 u/hr, ISF: 100, I/C : 1 (patient is not counting carbs and does not want to learn this at this time).  TDD is 10 units.  (100% basal).  He has not started taking meal or correction boluses.   Past Medical History:  Diagnosis Date   Anxiety    Arthritis    Cataract    Constipation    pt states goes EOD- hard stools    Coronary artery disease    Depression    Diabetes mellitus without complication (HCC)    Diabetic retinopathy (HCC)    DKA (diabetic ketoacidoses)    Hyperlipidemia    Hypertension    off meds   Hypertensive retinopathy    Psoriasis     Past Surgical History:  Procedure Laterality Date   ABDOMINAL AORTOGRAM W/LOWER EXTREMITY N/A 08/30/2020   Procedure: ABDOMINAL AORTOGRAM W/LOWER EXTREMITY;  Surgeon: Nada Libman, MD;  Location: [facility];  Service: Cardiovascular;  Laterality: N/A;   ABDOMINAL AORTOGRAM W/LOWER EXTREMITY N/A 11/15/2020   Procedure: ABDOMINAL AORTOGRAM W/LOWER EXTREMITY;  Surgeon: Nada Libman, MD;  Location: [facility];  Service: Cardiovascular;  Laterality: N/A;   ANKLE SURGERY     right   CARDIAC CATHETERIZATION   09/25/2001   COLONOSCOPY     ~10 yr ago- normal  per pt    CORONARY ARTERY BYPASS GRAFT  09/30/2001   CABG x 4   IR THORACENTESIS ASP PLEURAL SPACE W/IMG GUIDE  05/19/2020   LAPAROSCOPIC INGUINAL HERNIA REPAIR Bilateral 02/09/2010   MASS EXCISION Left 03/12/2013   Procedure: LEFT INDEX EXCISION MASS AND DEBRIDMENT DISTAL INTERPHALANGEAL JOINT;  Surgeon: Tami Ribas, MD;  Location: [facility];  Service: Orthopedics;  Laterality: Left;   PERIPHERAL VASCULAR BALLOON ANGIOPLASTY Left 08/30/2020   Procedure: PERIPHERAL VASCULAR BALLOON ANGIOPLASTY;  Surgeon: Nada Libman, MD;  Location: [facility];  Service: Cardiovascular;  Laterality: Left;  Peroneal    Social History   Socioeconomic History   Marital status: Divorced    Spouse name: Not on file   Number of children: 4   Years of education: 12   Highest education level: 12th grade  Occupational History   Occupation: Retired  Tobacco Use   Smoking status: Some Days    Types: Cigars   Smokeless tobacco: Never  Vaping Use   Vaping Use: Never used  Substance and Sexual Activity   Alcohol use: Yes    Comment: daily beer or scotch    Drug use: No   Sexual activity: Yes  Other Topics Concern   Not on file  Social History Narrative   Lives with his  adult son [name].   Has 4 children, 2 sons- both live in [place], 2 daughters one lives in [place], [place] and youngest daughter attends [employer].    Fiance lives in  [place], [place]   Social Determinants of Health   Financial Resource Strain: Not on file  Food Insecurity: Not on file  Transportation Needs: Not on file  Physical Activity: Not on file  Stress: Not on file  Social Connections: Not on file  Intimate Partner Violence: Not on file    Current Outpatient Medications on File Prior to Visit  Medication Sig Dispense Refill   apixaban (ELIQUIS) 5 MG TABS tablet Take 1 tablet (5 mg total) by mouth 2 (two) times daily. 60 tablet 2    aspirin EC 325 MG tablet Take 325 mg by mouth daily.     atorvastatin (LIPITOR) 40 MG tablet Take 1 tablet (40 mg total) by mouth daily. 90 tablet 1   B Complex Vitamins (B COMPLEX-B12 PO) Take 1 tablet by mouth daily.      BD PEN NEEDLE NANO 2ND GEN 32G X 4 MM MISC USE FOR INSULIN 300 each 1   Blood Glucose Monitoring Suppl (ONETOUCH VERIO FLEX SYSTEM) w/Device KIT      cephALEXin (KEFLEX) 500 MG capsule      Cholecalciferol (VITAMIN D) 125 MCG (5000 UT) CAPS Take 5,000 Units by mouth daily.      ciprofloxacin (CIPRO) 500 MG tablet Take 500 mg by mouth 2 (two) times daily.     Continuous Blood Gluc Sensor (DEXCOM G6 SENSOR) MISC 1 Device by Does not apply route See admin instructions. Change every 10 days 9 each 3   Continuous Blood Gluc Transmit (DEXCOM G6 TRANSMITTER) MISC Use as instructed to check blood sugar 1 each 1   cyclobenzaprine (FLEXERIL) 5 MG tablet      HYDROcodone-acetaminophen (NORCO/VICODIN) 5-325 MG tablet      hydrOXYzine (ATARAX/VISTARIL) 10 MG tablet      Insulin Aspart FlexPen (NOVOLOG) 100 UNIT/ML Inject 4 Units into the skin 2 (two) times daily with a meal. And pen needles 3/day 15 mL 3   Insulin Disposable Pump (OMNIPOD 5 G6 INTRO, GEN 5,) KIT 1 Device by Does not apply route every 3 (three) days. 30 kit 3   insulin glargine (LANTUS SOLOSTAR) 100 UNIT/ML Solostar Pen Inject 30 Units into the skin every morning. 30 mL PRN   Lancets (ONETOUCH DELICA PLUS [id number]) MISC Apply 1 each topically 4 (four) times daily.     lidocaine (LIDODERM) 5 % Place 1 patch onto the skin daily. Remove & Discard patch within 12 hours or as directed by MD 6 patch 0   lisinopril (ZESTRIL) 5 MG tablet TAKE 1 TABLET BY MOUTH DAILY 90 tablet 1   mirtazapine (REMERON) 15 MG tablet Take 1 tablet (15 mg total) by mouth at bedtime. 90 tablet 1   ONETOUCH VERIO test strip 1 each 4 (four) times daily.     PARoxetine (PAXIL) 20 MG tablet Take 1 tablet (20 mg total) by mouth daily. 90 tablet 1   SANTYL  ointment Apply 1 application topically daily.     traZODone (DESYREL) 50 MG tablet Take 1 tablet (50 mg total) by mouth at bedtime. 90 tablet 1   No current facility-administered medications on file prior to visit.    No Known Allergies  Family History  Problem Relation Age of Onset   Lymphoma Father    Cancer Paternal Grandmother  Colon Cancer   Colon cancer Paternal Grandmother    Colon polyps Neg Hx    Esophageal cancer Neg Hx    Rectal cancer Neg Hx    Stomach cancer Neg Hx    Diabetes Mellitus I Neg Hx     BP (!) 144/76 (BP Location: Right Arm, Patient Position: Sitting, Cuff Size: Normal)    Pulse 63    Ht _0  (1.905 m)    Wt 156 lb 3.2 oz (70.9 kg)    SpO2 94%    BMI 19.52 kg/m   Review of Systems     Objective:   Physical Exam   Lab Results  Component Value Date   HGBA1C 7.1 (A) 01/12/2021       Assessment & Plan:  Insulin-requiring type 2 DM.  Uncontrolled.    Patient Instructions  check your blood sugar twice a day.  vary the time of day when you check, between before the 3 meals, and at bedtime.  also check if you have symptoms of your blood sugar being too high or too low.  please keep a record of the readings and bring it to your next appointment here (or you can bring the meter itself).  You can write it on any piece of paper.  please call us sooner if your blood sugar goes below 70, or if most of your readings are over 200.   We will need to take this complex situation in stages.   For now, please continue the basal of 0.5 units/day, and start a bolus of 4 units twice a day (just before 2 meals).   Please come back for a follow-up appointment on 01/17/21, 10AM.

## 2021-01-12 NOTE — Telephone Encounter (Signed)
Message left that he needs to call me to let me know how he is doing. No messages were left for me as of 5PM on 01/11/21.

## 2021-01-12 NOTE — Patient Instructions (Addendum)
check your blood sugar twice a day.  vary the time of day when you check, between before the 3 meals, and at bedtime.  also check if you have symptoms of your blood sugar being too high or too low.  please keep a record of the readings and bring it to your next appointment here (or you can bring the meter itself).  You can write it on any piece of paper.  please call us sooner if your blood sugar goes below 70, or if most of your readings are over 200.   We will need to take this complex situation in stages.   For now, please continue the basal of 0.5 units/day, and start a bolus of 4 units twice a day (just before 2 meals).   Please come back for a follow-up appointment on 01/17/21, 10AM.

## 2021-01-12 NOTE — Progress Notes (Addendum)
Patient is here with his mother and caregiver, to learn how to use the OmniPod 5 insulin pump.  Settings were put in per Dr. Cordelia Pen order by the patient and my self:  Basal rate: 0.5 u/hr, ISF: 100, I/C : 1 (patient is not counting carbs and does not want to learn this at this time).  Target: 110- with correction over 110.  Timing: 4 hours. Patient was trained on how to fill a pod, and he did this with little help from me.  He applied it to his upper left arm, and we discussed the importance of placement in reference to his position of the dexcom sensor, which he is now wearing. His pump and sensor are linked to [place].  He was shown how to give a bolus and re demonstrated this with little assistance from me.  His blood sugar is now 475, and did a correction dose at 3.95 units at 9:30AM.  We reviewed the need to put in blood sugar readings for all bolus and to bolus for all foods eaten, except when treating low blood sugars.  He reported good understanding of this.  We reviewed all topics on the checklist and he signed this, indication understanding of all topics with no final questions. I stressed need to return in one week to review topics as well as need to see Dr. Loanne Drilling this Friday.  They reported good understanding of this.  He was told that I will call him tonight to see how he is doing, and he gave me his phone number

## 2021-01-12 NOTE — Patient Instructions (Signed)
Read over manual and pump start booklet Call OmniPod help line if questions Call office if blood sugar remain over 250, or if blood sugars drop below 70

## 2021-01-12 NOTE — Telephone Encounter (Signed)
Phone went straight to voice mail.  Message left to call me to let me know how he is doing.  I got no return call.

## 2021-01-12 NOTE — Telephone Encounter (Signed)
Message left on my machine by mother that the front desk was not able to make an appointment for Friday to see Dr. Loanne Drilling.  She wished me to call her back.   Mother reports that they have an appointment today at [facility] her son is doing great with a FBS today of 140.  Told her that I need to talk with him and have left 2 messages, and she apologized for him.  Appointment scheduled to finish pump training next Tuesday.

## 2021-01-12 NOTE — Progress Notes (Signed)
[facility] Note  01/18/2021     CHIEF COMPLAINT Patient presents for Retina Follow Up  HISTORY OF PRESENT ILLNESS: [name] [name] is a 66 y.o. male who presents to the clinic today for:   HPI     Retina Follow Up   Patient presents with  Diabetic Retinopathy.  In both eyes.  Severity is moderate.  Duration of 6 weeks.  Since onset it is rapidly worsening.  I, the attending physician,  performed the HPI with the patient and updated documentation appropriately.        Comments   Pt here for 6 wk ret f/u, delayed 4 wks from 2wk f/u fpr PDR OU. Pt states he cannot see out of OS. He is only seeing colors/LP and some objects. This has worsened since his previous appointment. Last checked blood sugar was 108 last night. Pt reports shortly after his last visit OS vision started deteriorating. He did fill a spec rx.       Last edited by [name], MD on 01/18/2021  1:07 PM.    Pt is delayed to follow up from 2 weeks to 6 weeks, pt states left eye vision has decreased since last visit, but he has no idea when he lost vision, pt and his mom say they were concerned when he lost vision, but they thought it would get better, pt is on an insulin pump now, which he states is helping to keep his blood sugar under control, pts BP is 99/65 in office today  Referring physician: Janith Lima, MD [place],  [place] [address]  HISTORICAL INFORMATION:   Selected notes from the MEDICAL RECORD NUMBER Referred by Dr. [name] for concern of PDR OU   CURRENT MEDICATIONS: No current outpatient medications on file. (Ophthalmic Drugs)   No current facility-administered medications for this visit. (Ophthalmic Drugs)   Current Outpatient Medications (Other)  Medication Sig   apixaban (ELIQUIS) 5 MG TABS tablet Take 1 tablet (5 mg total) by mouth 2 (two) times daily.   aspirin EC 325 MG tablet Take 325 mg by mouth daily.   atorvastatin  (LIPITOR) 40 MG tablet Take 1 tablet (40 mg total) by mouth daily.   B Complex Vitamins (B COMPLEX-B12 PO) Take 1 tablet by mouth daily.    BD PEN NEEDLE NANO 2ND GEN 32G X 4 MM MISC USE FOR INSULIN   Blood Glucose Monitoring Suppl (ONETOUCH VERIO FLEX SYSTEM) w/Device KIT    cephALEXin (KEFLEX) 500 MG capsule    Cholecalciferol (VITAMIN D) 125 MCG (5000 UT) CAPS Take 5,000 Units by mouth daily.    ciprofloxacin (CIPRO) 500 MG tablet Take 500 mg by mouth 2 (two) times daily.   Continuous Blood Gluc Sensor (DEXCOM G6 SENSOR) MISC 1 Device by Does not apply route See admin instructions. Change every 10 days   Continuous Blood Gluc Transmit (DEXCOM G6 TRANSMITTER) MISC Use as instructed to check blood sugar   cyclobenzaprine (FLEXERIL) 5 MG tablet    HYDROcodone-acetaminophen (NORCO/VICODIN) 5-325 MG tablet    hydrOXYzine (ATARAX/VISTARIL) 10 MG tablet    Insulin Aspart FlexPen (NOVOLOG) 100 UNIT/ML Inject 4 Units into the skin 2 (two) times daily with a meal. And pen needles 3/day   Insulin Disposable Pump (OMNIPOD 5 G6 INTRO, GEN 5,) KIT 1 Device by Does not apply route every 3 (three) days.   insulin glargine (LANTUS SOLOSTAR) 100 UNIT/ML Solostar Pen Inject 30 Units into the  skin every morning.   Lancets (ONETOUCH DELICA PLUS [id number]) MISC Apply 1 each topically 4 (four) times daily.   lidocaine (LIDODERM) 5 % Place 1 patch onto the skin daily. Remove & Discard patch within 12 hours or as directed by MD   lisinopril (ZESTRIL) 5 MG tablet TAKE 1 TABLET BY MOUTH DAILY   mirtazapine (REMERON) 15 MG tablet Take 1 tablet (15 mg total) by mouth at bedtime.   ONETOUCH VERIO test strip 1 each 4 (four) times daily.   PARoxetine (PAXIL) 20 MG tablet Take 1 tablet (20 mg total) by mouth daily.   SANTYL ointment Apply 1 application topically daily.   traZODone (DESYREL) 50 MG tablet Take 1 tablet (50 mg total) by mouth at bedtime.   No current facility-administered medications for this visit. (Other)    REVIEW OF SYSTEMS: ROS   Positive for: Neurological, Skin, Endocrine, Cardiovascular, Eyes Negative for: Constitutional, Gastrointestinal, Genitourinary, Musculoskeletal, HENT, Respiratory, Psychiatric, Allergic/Imm, Heme/Lymph Last edited by Kingsley Spittle, COT on 01/18/2021  8:56 AM.     ALLERGIES No Known Allergies  PAST MEDICAL HISTORY Past Medical History:  Diagnosis Date   Anxiety    Arthritis    Cataract    Constipation    pt states goes EOD- hard stools    Coronary artery disease    Depression    Diabetes mellitus without complication (HCC)    Diabetic retinopathy ([place])    DKA (diabetic ketoacidoses)    Hyperlipidemia    Hypertension    off meds   Hypertensive retinopathy    Psoriasis    Past Surgical History:  Procedure Laterality Date   ABDOMINAL AORTOGRAM W/LOWER EXTREMITY N/A 08/30/2020   Procedure: ABDOMINAL AORTOGRAM W/LOWER EXTREMITY;  Surgeon: Serafina Mitchell, MD;  Location: [facility];  Service: Cardiovascular;  Laterality: N/A;   ABDOMINAL AORTOGRAM W/LOWER EXTREMITY N/A 11/15/2020   Procedure: ABDOMINAL AORTOGRAM W/LOWER EXTREMITY;  Surgeon: Serafina Mitchell, MD;  Location: [facility];  Service: Cardiovascular;  Laterality: N/A;   ANKLE SURGERY     right   CARDIAC CATHETERIZATION  09/25/2001   COLONOSCOPY     ~10 yr ago- normal  per pt    CORONARY ARTERY BYPASS GRAFT  09/30/2001   CABG x 4   IR THORACENTESIS ASP PLEURAL SPACE W/IMG GUIDE  05/19/2020   LAPAROSCOPIC INGUINAL HERNIA REPAIR Bilateral 02/09/2010   MASS EXCISION Left 03/12/2013   Procedure: LEFT INDEX EXCISION MASS AND DEBRIDMENT DISTAL INTERPHALANGEAL JOINT;  Surgeon: Tennis Must, MD;  Location: [facility];  Service: Orthopedics;  Laterality: Left;   PERIPHERAL VASCULAR BALLOON ANGIOPLASTY Left 08/30/2020   Procedure: PERIPHERAL VASCULAR BALLOON ANGIOPLASTY;  Surgeon: Serafina Mitchell, MD;  Location: [facility];  Service: Cardiovascular;   Laterality: Left;  Peroneal   FAMILY HISTORY Family History  Problem Relation Age of Onset   Lymphoma Father    Cancer Paternal Grandmother        Colon Cancer   Colon cancer Paternal Grandmother    Colon polyps Neg Hx    Esophageal cancer Neg Hx    Rectal cancer Neg Hx    Stomach cancer Neg Hx    Diabetes Mellitus I Neg Hx    SOCIAL HISTORY Social History   Tobacco Use   Smoking status: Some Days    Types: Cigars   Smokeless tobacco: Never  Vaping Use   Vaping Use: Never used  Substance Use Topics   Alcohol use: Yes  Comment: daily beer or scotch    Drug use: No       OPHTHALMIC EXAM: Base Eye Exam     Visual Acuity (Snellen - Linear)       Right Left   Dist cc 20/60 CF at 3'   Dist ph cc 20/50 NI    Correction: Glasses         Tonometry (Tonopen, 9:09 AM)       Right Left   Pressure 8 9         Pupils       Dark Light Shape React APD   Right 3 2 Round Brisk None   Left 3 2 Round Brisk None         Visual Fields (Counting fingers)       Left Right    Full Full         Extraocular Movement       Right Left    Full, Ortho Full, Ortho         Neuro/Psych     Oriented x3: Yes   Mood/Affect: Normal         Dilation     Both eyes: 1.0% Mydriacyl, 2.5% Phenylephrine @ 9:10 AM           Slit Lamp and Fundus Exam     External Exam       Right Left   External Normal Normal         Slit Lamp Exam       Right Left   Lids/Lashes Dermatochalasis - upper lid, Meibomian gland dysfunction Dermatochalasis - upper lid, Meibomian gland dysfunction   Conjunctiva/Sclera White and quiet White and quiet   Cornea arcus, 2-3+ Punctate epithelial erosions arcus, 2+ Punctate epithelial erosions   Anterior Chamber Deep and quiet Deep and quiet   Iris Round and dilated, No NVI Round and dilated, No NVI   Lens 2-3+ Nuclear sclerosis with brunescence, 2+ Cortical cataract 2-3+ Nuclear sclerosis with brunescence, 2-3+ Cortical  cataract   Anterior Vitreous Vitreous syneresis, blood stained vitreous condensations improving Vitreous syneresis, diffuse VH         Fundus Exam       Right Left   Disc Pink and Sharp, +fine NVD superiorly No view   C/D Ratio 0.2 0.2   Macula Flat, Blunted foveal reflex, scatterd MA, +cystic changes/edema temporal to fovea No view   Vessels attenuated, Tortuous, +NVE just inside ST arcades attenuated, Tortuous, +NVD; NVE along ST arcades   Periphery Attached; 360 DBH, 360 PRP with room for fill in Hazy view, periphery grossly attached           Refraction     Wearing Rx       Sphere Cylinder Axis Add   Right +0.25 +2.00 180 +2.50   Left +0.50 +1.75 009 +2.50         Manifest Refraction       Sphere Cylinder Axis Dist VA   Right       Left +8.75 +1.75 180 NI           IMAGING AND PROCEDURES  Imaging and Procedures for 01/18/2021  OCT, Retina - OU - Both Eyes       Right Eye Quality was good. Central Foveal Thickness: 324. Progression has worsened. Findings include abnormal foveal contour, no SRF, intraretinal fluid, preretinal fibrosis, intraretinal hyper-reflective material (Partial PVD, mild interval increase in IRF ).   Left Eye Quality was good. Progression has  worsened. Findings include abnormal foveal contour, no SRF, intraretinal fluid, preretinal fibrosis, macular pucker (interval increase in diffuse vitreous opacities, Retina grossly attached on scattered peripheral sweeps).   Notes *Images captured and stored on drive  Diagnosis / Impression:  PDR with DME OU OD: Partial PVD, Partial PVD, mild interval increase in IRF  OS: interval increase in diffuse vitreous opacities, retina grossly attached on scattered peripheral sweeps  Clinical management:  See below  Abbreviations: NFP - Normal foveal profile. CME - cystoid macular edema. PED - pigment epithelial detachment. IRF - intraretinal fluid. SRF - subretinal fluid. EZ - ellipsoid zone. ERM -  epiretinal membrane. ORA - outer retinal atrophy. ORT - outer retinal tubulation. SRHM - subretinal hyper-reflective material. IRHM - intraretinal hyper-reflective material      Intravitreal Injection, Pharmacologic Agent - OS - Left Eye       Time Out 01/18/2021. 10:33 AM. Confirmed correct patient, procedure, site, and patient consented.   Anesthesia Topical anesthesia was used. Anesthetic medications included Lidocaine 2%, Proparacaine 0.5%.   Procedure Preparation included 5% betadine to ocular surface, eyelid speculum. A (32 g) needle was used.   Injection: 1.25 mg Bevacizumab 1.11m/0.05ml   Route: Intravitreal, Site: Left Eye   NDC:: 33545-625-63 Lot:: [id number] Expiration date: 02/09/2021, Waste: 0.05 mL   Post-op Post injection exam found visual acuity of at least counting fingers. The patient tolerated the procedure well. There were no complications. The patient received written and verbal post procedure care education. Post injection medications were not given.            ASSESSMENT/PLAN:    ICD-10-CM   1. Proliferative diabetic retinopathy of both eyes with macular edema associated with type 2 diabetes mellitus (HCC)  E11.3513 OCT, Retina - OU - Both Eyes    Intravitreal Injection, Pharmacologic Agent - OS - Left Eye    Bevacizumab (AVASTIN) SOLN 1.25 mg    CANCELED: Intravitreal Injection, Pharmacologic Agent - OD - Right Eye    2. Vitreous hemorrhage of left eye (HCC)  H43.12     3. Essential hypertension  I10     4. Hypertensive retinopathy of both eyes  H35.033     5. Combined forms of age-related cataract of both eyes  H25.813      1. Severe proliferative diabetic retinopathy, both eyes  - s/p PRP OS (11.10.22)  - s/p PRP OD (12.02.22) - A1c >15% on 10.11.22 - pt reports improved BG control with changes in insulin regimen, insulin pump being shipped - exam shows extensive neovascularization, fibrosis and hemorrhage OU  - OS w/ increased VH --  diffuse - BCVA 20/50 OD, CF 3' OS - FA (11.09.22) shows areas of vascular non-perfusion OU, +NVD OU and +NVE OU - OCT shows OD: Partial PVD, Partial PVD, mild interval increase in IRF; OS: Retina grossly attached on scattered peripheral sweeps, interval increase in diffuse vitreous opacities - discussed findings, severity of his disease, prognosis and likely need for extensive treatments -- PRP OU, multiple rounds of anti-VEGF therapy OU and possible surgery - recommend IVA OS #1 today, 01.18.23 for diffuse VH - pt wishes to proceed with injection - RBA of procedure discussed, questions answered - informed consent obtained and signed, 01.18.23 - see procedure note - F/u 1 week DFE, OCT  2. Vitreous hemorrhage OS  - acute increase in VH OS ~3 wks ago (late Dec 2022) per pt report  - BP 99/65 in R arm today  - suspect VH related to DM2  -  recommend IVA OS #1 as above - VH precautions reviewed -- minimize activities, keep head elevated, avoid ASA/NSAIDs/blood thinners as able - f/u 1 week, DFE, OCT  3,4. Hypertensive retinopathy OU - discussed importance of tight BP control - monitor   5. Mixed Cataract OU - The symptoms of cataract, surgical options, and treatments and risks were discussed with patient - discussed diagnosis and progression - not yet visually significant - monitor for now   Ophthalmic Meds Ordered this visit:  Meds ordered this encounter  Medications   Bevacizumab (AVASTIN) SOLN 1.25 mg     Return in about 1 week (around 01/25/2021) for f/u VH OS, DFE, OCT.  There are no Patient Instructions on file for this visit.  Explained the diagnoses, plan, and follow up with the patient and they expressed understanding.  Patient expressed understanding of the importance of proper follow up care.   This document serves as a record of services personally performed by [name], MD, PhD. It was created on their behalf by Orvan Falconer, an ophthalmic technician. The  creation of this record is the provider's dictation and/or activities during the visit.    Electronically signed by: Orvan Falconer, OA, 01/18/21  1:30 PM  This document serves as a record of services personally performed by [name], MD, PhD. It was created on their behalf by San Jetty. Owens Shark, OA an ophthalmic technician. The creation of this record is the provider's dictation and/or activities during the visit.    Electronically signed by: San Jetty. Owens Shark, [place] 01.18.2023 1:30 PM  [name], M.D., Ph.D. Diseases & Surgery of the Retina and Vitreous [facility]  I have reviewed the above documentation for accuracy and completeness, and I agree with the above. [name], M.D., Ph.D. 01/18/21 1:30 PM   Abbreviations: M myopia (nearsighted); A astigmatism; H hyperopia (farsighted); P presbyopia; Mrx spectacle prescription;  CTL contact lenses; OD right eye; OS left eye; OU both eyes  XT exotropia; ET esotropia; PEK punctate epithelial keratitis; PEE punctate epithelial erosions; DES dry eye syndrome; MGD meibomian gland dysfunction; ATs artificial tears; PFAT's preservative free artificial tears; [place] nuclear sclerotic cataract; PSC posterior subcapsular cataract; ERM epi-retinal membrane; PVD posterior vitreous detachment; RD retinal detachment; DM diabetes mellitus; DR diabetic retinopathy; NPDR non-proliferative diabetic retinopathy; PDR proliferative diabetic retinopathy; CSME clinically significant macular edema; DME diabetic macular edema; dbh dot blot hemorrhages; CWS cotton wool spot; POAG primary open angle glaucoma; C/D cup-to-disc ratio; HVF humphrey visual field; GVF goldmann visual field; OCT optical coherence tomography; IOP intraocular pressure; BRVO Branch retinal vein occlusion; CRVO central retinal vein occlusion; CRAO central retinal artery occlusion; BRAO branch retinal artery occlusion; RT retinal tear; SB scleral buckle; PPV pars plana  vitrectomy; VH Vitreous hemorrhage; PRP panretinal laser photocoagulation; IVK intravitreal kenalog; VMT vitreomacular traction; MH Macular hole;  NVD neovascularization of the disc; NVE neovascularization elsewhere; AREDS age related eye disease study; ARMD age related macular degeneration; POAG primary open angle glaucoma; EBMD epithelial/anterior basement membrane dystrophy; ACIOL anterior chamber intraocular lens; IOL intraocular lens; PCIOL posterior chamber intraocular lens; Phaco/IOL phacoemulsification with intraocular lens placement; [place] photorefractive keratectomy; LASIK laser assisted in situ keratomileusis; HTN hypertension; DM diabetes mellitus; COPD chronic obstructive pulmonary disease

## 2021-01-12 NOTE — Telephone Encounter (Signed)
Patient is a new Omnipod 5 training on January 10.   Pump trainer has had problems reaching patient by phone.  Patient did return call this am and left a message on our answering machine.   Called patient back but it went to his voice mail.   Message left for him to return our call.  Antonieta Iba, RD, LDN, CDCES

## 2021-01-13 ENCOUNTER — Ambulatory Visit: Payer: HMO | Admitting: Endocrinology

## 2021-01-17 ENCOUNTER — Other Ambulatory Visit: Payer: Self-pay

## 2021-01-17 ENCOUNTER — Encounter: Payer: HMO | Admitting: Nutrition

## 2021-01-17 ENCOUNTER — Ambulatory Visit (INDEPENDENT_AMBULATORY_CARE_PROVIDER_SITE_OTHER): Payer: HMO | Admitting: Endocrinology

## 2021-01-17 DIAGNOSIS — E10628 Type 1 diabetes mellitus with other skin complications: Secondary | ICD-10-CM

## 2021-01-17 DIAGNOSIS — E1165 Type 2 diabetes mellitus with hyperglycemia: Secondary | ICD-10-CM

## 2021-01-17 DIAGNOSIS — L089 Local infection of the skin and subcutaneous tissue, unspecified: Secondary | ICD-10-CM

## 2021-01-17 NOTE — Progress Notes (Signed)
Subjective:    Patient ID: [name] [name], male    DOB: 08/10/1955, 66 y.o.   MRN: [id number]  HPI Pt returns for f/u of diabetes mellitus: DM type: Insulin-requiring type 2 (due to lean body habitus: he is at risk for evolving type 1) Dx'ed: 0037 Complications: GP, CAD, PN, PAD, foot ulcer, and DR Therapy: insulin since 2013 DKA: never Severe hypoglycemia: never Pancreatitis: never Pancreatic imaging: normal on 2020 CT SDOH: Sister provides some hx, due to pt's poor health and memory loss; Other: He does not use continuous glucose monitor; he stopped multiple daily injections, due to missing doses.   Interval history: Pt takes these settings: basal rate of 0.5 units/hr.  bolus of 4 units/meal correction bolus (which some people call "sensitivity," or "insulin sensitivity ratio," or just "isr") of 1 unit for each 100 by which your glucose exceeds 100 Pt says cbg varies from 128-400.  It is in general lowest after breakfast, when he takes 6 units then.  It is highest since Omnipod ran out yesterday.   Past Medical History:  Diagnosis Date   Anxiety    Arthritis    Cataract    Constipation    pt states goes EOD- hard stools    Coronary artery disease    Depression    Diabetes mellitus without complication (HCC)    Diabetic retinopathy ([place])    DKA (diabetic ketoacidoses)    Hyperlipidemia    Hypertension    off meds   Hypertensive retinopathy    Psoriasis     Past Surgical History:  Procedure Laterality Date   ABDOMINAL AORTOGRAM W/LOWER EXTREMITY N/A 08/30/2020   Procedure: ABDOMINAL AORTOGRAM W/LOWER EXTREMITY;  Surgeon: Serafina Mitchell, MD;  Location: [facility];  Service: Cardiovascular;  Laterality: N/A;   ABDOMINAL AORTOGRAM W/LOWER EXTREMITY N/A 11/15/2020   Procedure: ABDOMINAL AORTOGRAM W/LOWER EXTREMITY;  Surgeon: Serafina Mitchell, MD;  Location: [facility];  Service: Cardiovascular;  Laterality: N/A;   ANKLE SURGERY     right   CARDIAC  CATHETERIZATION  09/25/2001   COLONOSCOPY     ~10 yr ago- normal  per pt    CORONARY ARTERY BYPASS GRAFT  09/30/2001   CABG x 4   IR THORACENTESIS ASP PLEURAL SPACE W/IMG GUIDE  05/19/2020   LAPAROSCOPIC INGUINAL HERNIA REPAIR Bilateral 02/09/2010   MASS EXCISION Left 03/12/2013   Procedure: LEFT INDEX EXCISION MASS AND DEBRIDMENT DISTAL INTERPHALANGEAL JOINT;  Surgeon: Tennis Must, MD;  Location: [facility];  Service: Orthopedics;  Laterality: Left;   PERIPHERAL VASCULAR BALLOON ANGIOPLASTY Left 08/30/2020   Procedure: PERIPHERAL VASCULAR BALLOON ANGIOPLASTY;  Surgeon: Serafina Mitchell, MD;  Location: [facility];  Service: Cardiovascular;  Laterality: Left;  Peroneal    Social History   Socioeconomic History   Marital status: Divorced    Spouse name: Not on file   Number of children: 4   Years of education: 12   Highest education level: 12th grade  Occupational History   Occupation: Retired  Tobacco Use   Smoking status: Some Days    Types: Cigars   Smokeless tobacco: Never  Vaping Use   Vaping Use: Never used  Substance and Sexual Activity   Alcohol use: Yes    Comment: daily beer or scotch    Drug use: No   Sexual activity: Yes  Other Topics Concern   Not on file  Social History Narrative   Lives with his adult son [name].  Has 4 children, 2 sons- both live in [place], 2 daughters one lives in [place], [place] and youngest daughter attends [employer].    Fiance lives in  [place], [place]   Social Determinants of Health   Financial Resource Strain: Not on file  Food Insecurity: Not on file  Transportation Needs: Not on file  Physical Activity: Not on file  Stress: Not on file  Social Connections: Not on file  Intimate Partner Violence: Not on file    Current Outpatient Medications on File Prior to Visit  Medication Sig Dispense Refill   apixaban (ELIQUIS) 5 MG TABS tablet Take 1 tablet (5 mg total) by mouth 2 (two) times daily.  60 tablet 2   aspirin EC 325 MG tablet Take 325 mg by mouth daily.     atorvastatin (LIPITOR) 40 MG tablet Take 1 tablet (40 mg total) by mouth daily. 90 tablet 1   B Complex Vitamins (B COMPLEX-B12 PO) Take 1 tablet by mouth daily.      BD PEN NEEDLE NANO 2ND GEN 32G X 4 MM MISC USE FOR INSULIN 300 each 1   Blood Glucose Monitoring Suppl (ONETOUCH VERIO FLEX SYSTEM) w/Device KIT      cephALEXin (KEFLEX) 500 MG capsule      Cholecalciferol (VITAMIN D) 125 MCG (5000 UT) CAPS Take 5,000 Units by mouth daily.      ciprofloxacin (CIPRO) 500 MG tablet Take 500 mg by mouth 2 (two) times daily.     Continuous Blood Gluc Sensor (DEXCOM G6 SENSOR) MISC 1 Device by Does not apply route See admin instructions. Change every 10 days 9 each 3   Continuous Blood Gluc Transmit (DEXCOM G6 TRANSMITTER) MISC Use as instructed to check blood sugar 1 each 1   cyclobenzaprine (FLEXERIL) 5 MG tablet      HYDROcodone-acetaminophen (NORCO/VICODIN) 5-325 MG tablet      hydrOXYzine (ATARAX/VISTARIL) 10 MG tablet      Insulin Aspart FlexPen (NOVOLOG) 100 UNIT/ML Inject 4 Units into the skin 2 (two) times daily with a meal. And pen needles 3/day 15 mL 3   Insulin Disposable Pump (OMNIPOD 5 G6 INTRO, GEN 5,) KIT 1 Device by Does not apply route every 3 (three) days. 30 kit 3   insulin glargine (LANTUS SOLOSTAR) 100 UNIT/ML Solostar Pen Inject 30 Units into the skin every morning. 30 mL PRN   Lancets (ONETOUCH DELICA PLUS [id number]) MISC Apply 1 each topically 4 (four) times daily.     lidocaine (LIDODERM) 5 % Place 1 patch onto the skin daily. Remove & Discard patch within 12 hours or as directed by MD 6 patch 0   lisinopril (ZESTRIL) 5 MG tablet TAKE 1 TABLET BY MOUTH DAILY 90 tablet 1   mirtazapine (REMERON) 15 MG tablet Take 1 tablet (15 mg total) by mouth at bedtime. 90 tablet 1   ONETOUCH VERIO test strip 1 each 4 (four) times daily.     PARoxetine (PAXIL) 20 MG tablet Take 1 tablet (20 mg total) by mouth daily. 90  tablet 1   SANTYL ointment Apply 1 application topically daily.     traZODone (DESYREL) 50 MG tablet Take 1 tablet (50 mg total) by mouth at bedtime. 90 tablet 1   No current facility-administered medications on file prior to visit.    No Known Allergies  Family History  Problem Relation Age of Onset   Lymphoma Father    Cancer Paternal Grandmother        Colon Cancer  Colon cancer Paternal Grandmother    Colon polyps Neg Hx    Esophageal cancer Neg Hx    Rectal cancer Neg Hx    Stomach cancer Neg Hx    Diabetes Mellitus I Neg Hx     There were no vitals taken for this visit.  Review of Systems     Objective:   Physical Exam   Lab Results  Component Value Date   HGBA1C 7.1 (A) 01/12/2021      Assessment & Plan:  Insulin-requiring type 2 DM: uncontrolled.    Patient Instructions  check your blood sugar twice a day.  vary the time of day when you check, between before the 3 meals, and at bedtime.  also check if you have symptoms of your blood sugar being too high or too low.  please keep a record of the readings and bring it to your next appointment here (or you can bring the meter itself).  You can write it on any piece of paper.  please call us sooner if your blood sugar goes below 70, or if most of your readings are over 200.   We will need to take this complex situation in stages.   For now, please continue the basal of 0.7 units/day, and continue the bolus of 4 units twice a day (just before 2 meals).   Please come back for a follow-up appointment in 2 weeks.

## 2021-01-17 NOTE — Progress Notes (Signed)
Patient is here with his mother,to review how to bolus, change sensors and pods. He reports that his pod ended yesterday and he did not know what to do.  His son changed the one 4 days ago, but he is not available to help him with this. Blood sugar is 565 now.   We discussed the Dexcom sensors and when/how he needs to change this.  Booklet given with directions for this, as well as the 800 help line number.  He filled a pod using the Novolog pens, because he says he can not see the syringe and small needle to draw up the insulin and insert it into the pod.  He was shown how to use the pens, and says this would be better for him.   He filled a pod and he reports that he knows how to do this, and did not want any written steps for how to do this. We reviewed how to give a bolus, making sure he puts his blood sugar readings into the bolus calculation settings.  Written steps given to him with directions on what to do.   He gave himself a correction dose with some assistance from me.  They had no final questions.  He will see DR. Loanne Drilling after seeing me to review his blood sugar readings.   He signed the checklist as understanding basic topics of how to use this pump. I encourage him to return in 2-3 weeks, and they agreed to call at a later time.

## 2021-01-17 NOTE — Patient Instructions (Signed)
Read over manual on how to change sensor and pods.

## 2021-01-17 NOTE — Patient Instructions (Addendum)
check your blood sugar twice a day.  vary the time of day when you check, between before the 3 meals, and at bedtime.  also check if you have symptoms of your blood sugar being too high or too low.  please keep a record of the readings and bring it to your next appointment here (or you can bring the meter itself).  You can write it on any piece of paper.  please call us sooner if your blood sugar goes below 70, or if most of your readings are over 200.   We will need to take this complex situation in stages.   For now, please continue the basal of 0.7 units/day, and continue the bolus of 4 units twice a day (just before 2 meals).   Please come back for a follow-up appointment in 2 weeks.

## 2021-01-18 ENCOUNTER — Encounter (INDEPENDENT_AMBULATORY_CARE_PROVIDER_SITE_OTHER): Payer: Self-pay | Admitting: Ophthalmology

## 2021-01-18 ENCOUNTER — Ambulatory Visit (INDEPENDENT_AMBULATORY_CARE_PROVIDER_SITE_OTHER): Payer: HMO | Admitting: Ophthalmology

## 2021-01-18 VITALS — BP 99/65 | HR 73

## 2021-01-18 DIAGNOSIS — I10 Essential (primary) hypertension: Secondary | ICD-10-CM | POA: Diagnosis not present

## 2021-01-18 DIAGNOSIS — E113513 Type 2 diabetes mellitus with proliferative diabetic retinopathy with macular edema, bilateral: Secondary | ICD-10-CM

## 2021-01-18 DIAGNOSIS — H35033 Hypertensive retinopathy, bilateral: Secondary | ICD-10-CM

## 2021-01-18 DIAGNOSIS — H4312 Vitreous hemorrhage, left eye: Secondary | ICD-10-CM | POA: Diagnosis not present

## 2021-01-18 DIAGNOSIS — H25813 Combined forms of age-related cataract, bilateral: Secondary | ICD-10-CM

## 2021-01-18 MED ORDER — BEVACIZUMAB CHEMO INJECTION 1.25MG/0.05ML SYRINGE FOR KALEIDOSCOPE
1.2500 mg | INTRAVITREAL | Status: AC | PRN
  Administered 2021-01-18: 1.25 mg via INTRAVITREAL

## 2021-01-19 ENCOUNTER — Encounter (HOSPITAL_BASED_OUTPATIENT_CLINIC_OR_DEPARTMENT_OTHER): Payer: HMO | Admitting: Internal Medicine

## 2021-01-24 NOTE — Progress Notes (Incomplete)
[facility] Note  01/25/2021     CHIEF COMPLAINT Patient presents for No chief complaint on file.  HISTORY OF PRESENT ILLNESS: [name] [name] is a 66 y.o. male who presents to the clinic today for:    \ Referring physician: Janith Lima, MD [place],  [place] [id number]  HISTORICAL INFORMATION:   Selected notes from the MEDICAL RECORD NUMBER Referred by Dr. [name] for concern of PDR OU   CURRENT MEDICATIONS: No current outpatient medications on file. (Ophthalmic Drugs)   No current facility-administered medications for this visit. (Ophthalmic Drugs)   Current Outpatient Medications (Other)  Medication Sig   apixaban (ELIQUIS) 5 MG TABS tablet Take 1 tablet (5 mg total) by mouth 2 (two) times daily.   aspirin EC 325 MG tablet Take 325 mg by mouth daily.   atorvastatin (LIPITOR) 40 MG tablet Take 1 tablet (40 mg total) by mouth daily.   B Complex Vitamins (B COMPLEX-B12 PO) Take 1 tablet by mouth daily.    BD PEN NEEDLE NANO 2ND GEN 32G X 4 MM MISC USE FOR INSULIN   Blood Glucose Monitoring Suppl (ONETOUCH VERIO FLEX SYSTEM) w/Device KIT    cephALEXin (KEFLEX) 500 MG capsule    Cholecalciferol (VITAMIN D) 125 MCG (5000 UT) CAPS Take 5,000 Units by mouth daily.    ciprofloxacin (CIPRO) 500 MG tablet Take 500 mg by mouth 2 (two) times daily.   Continuous Blood Gluc Sensor (DEXCOM G6 SENSOR) MISC 1 Device by Does not apply route See admin instructions. Change every 10 days   Continuous Blood Gluc Transmit (DEXCOM G6 TRANSMITTER) MISC Use as instructed to check blood sugar   cyclobenzaprine (FLEXERIL) 5 MG tablet    HYDROcodone-acetaminophen (NORCO/VICODIN) 5-325 MG tablet    hydrOXYzine (ATARAX/VISTARIL) 10 MG tablet    Insulin Aspart FlexPen (NOVOLOG) 100 UNIT/ML Inject 4 Units into the skin 2 (two) times daily with a meal. And pen needles 3/day   Insulin Disposable Pump (OMNIPOD 5 G6 INTRO, GEN 5,) KIT 1 Device by  Does not apply route every 3 (three) days.   insulin glargine (LANTUS SOLOSTAR) 100 UNIT/ML Solostar Pen Inject 30 Units into the skin every morning.   Lancets (ONETOUCH DELICA PLUS [id number]) MISC Apply 1 each topically 4 (four) times daily.   lidocaine (LIDODERM) 5 % Place 1 patch onto the skin daily. Remove & Discard patch within 12 hours or as directed by MD   lisinopril (ZESTRIL) 5 MG tablet TAKE 1 TABLET BY MOUTH DAILY   mirtazapine (REMERON) 15 MG tablet Take 1 tablet (15 mg total) by mouth at bedtime.   ONETOUCH VERIO test strip 1 each 4 (four) times daily.   PARoxetine (PAXIL) 20 MG tablet Take 1 tablet (20 mg total) by mouth daily.   SANTYL ointment Apply 1 application topically daily.   traZODone (DESYREL) 50 MG tablet Take 1 tablet (50 mg total) by mouth at bedtime.   No current facility-administered medications for this visit. (Other)   REVIEW OF SYSTEMS:   ALLERGIES No Known Allergies  PAST MEDICAL HISTORY Past Medical History:  Diagnosis Date   Anxiety    Arthritis    Cataract    Constipation    pt states goes EOD- hard stools    Coronary artery disease    Depression    Diabetes mellitus without complication (HCC)    Diabetic retinopathy ([place])    DKA (diabetic ketoacidoses)    Hyperlipidemia  Hypertension    off meds   Hypertensive retinopathy    Psoriasis    Past Surgical History:  Procedure Laterality Date   ABDOMINAL AORTOGRAM W/LOWER EXTREMITY N/A 08/30/2020   Procedure: ABDOMINAL AORTOGRAM W/LOWER EXTREMITY;  Surgeon: Serafina Mitchell, MD;  Location: [facility];  Service: Cardiovascular;  Laterality: N/A;   ABDOMINAL AORTOGRAM W/LOWER EXTREMITY N/A 11/15/2020   Procedure: ABDOMINAL AORTOGRAM W/LOWER EXTREMITY;  Surgeon: Serafina Mitchell, MD;  Location: [facility];  Service: Cardiovascular;  Laterality: N/A;   ANKLE SURGERY     right   CARDIAC CATHETERIZATION  09/25/2001   COLONOSCOPY     ~10 yr ago- normal  per pt    CORONARY ARTERY  BYPASS GRAFT  09/30/2001   CABG x 4   IR THORACENTESIS ASP PLEURAL SPACE W/IMG GUIDE  05/19/2020   LAPAROSCOPIC INGUINAL HERNIA REPAIR Bilateral 02/09/2010   MASS EXCISION Left 03/12/2013   Procedure: LEFT INDEX EXCISION MASS AND DEBRIDMENT DISTAL INTERPHALANGEAL JOINT;  Surgeon: Tennis Must, MD;  Location: [facility];  Service: Orthopedics;  Laterality: Left;   PERIPHERAL VASCULAR BALLOON ANGIOPLASTY Left 08/30/2020   Procedure: PERIPHERAL VASCULAR BALLOON ANGIOPLASTY;  Surgeon: Serafina Mitchell, MD;  Location: [facility];  Service: Cardiovascular;  Laterality: Left;  Peroneal   FAMILY HISTORY Family History  Problem Relation Age of Onset   Lymphoma Father    Cancer Paternal Grandmother        Colon Cancer   Colon cancer Paternal Grandmother    Colon polyps Neg Hx    Esophageal cancer Neg Hx    Rectal cancer Neg Hx    Stomach cancer Neg Hx    Diabetes Mellitus I Neg Hx    SOCIAL HISTORY Social History   Tobacco Use   Smoking status: Some Days    Types: Cigars   Smokeless tobacco: Never  Vaping Use   Vaping Use: Never used  Substance Use Topics   Alcohol use: Yes    Comment: daily beer or scotch    Drug use: No       OPHTHALMIC EXAM: Not recorded    IMAGING AND PROCEDURES  Imaging and Procedures for 01/25/2021          ASSESSMENT/PLAN:  No diagnosis found.  1. Severe proliferative diabetic retinopathy, both eyes  - s/p PRP OS (11.10.22)  - s/p PRP OD (12.02.22) - A1c >15% on 10.11.22 - pt reports improved BG control with changes in insulin regimen, insulin pump being shipped - exam shows extensive neovascularization, fibrosis and hemorrhage OU  - OS w/ increased VH -- diffuse - BCVA 20/50 OD, CF 3' OS - FA (11.09.22) shows areas of vascular non-perfusion OU, +NVD OU and +NVE OU - OCT shows OD: Partial PVD, Partial PVD, mild interval increase in IRF; OS: Retina grossly attached on scattered peripheral sweeps, interval increase in diffuse  vitreous opacities - discussed findings, severity of his disease, prognosis and likely need for extensive treatments -- PRP OU, multiple rounds of anti-VEGF therapy OU and possible surgery - recommend IVA OS #1 today, 01.18.23 for diffuse VH - pt wishes to proceed with injection - RBA of procedure discussed, questions answered - informed consent obtained and signed, 01.18.23 - see procedure note - F/u 1 week DFE, OCT  2. Vitreous hemorrhage OS  - acute increase in VH OS ~3 wks ago (late Dec 2022) per pt report  - BP 99/65 in R arm today  - suspect VH related to  DM2  - recommend IVA OS #1 as above - VH precautions reviewed -- minimize activities, keep head elevated, avoid ASA/NSAIDs/blood thinners as able - f/u 1 week, DFE, OCT  3,4. Hypertensive retinopathy OU - discussed importance of tight BP control - monitor   5. Mixed Cataract OU - The symptoms of cataract, surgical options, and treatments and risks were discussed with patient - discussed diagnosis and progression - not yet visually significant - monitor for now   Ophthalmic Meds Ordered this visit:  No orders of the defined types were placed in this encounter.    No follow-ups on file.  There are no Patient Instructions on file for this visit.  Explained the diagnoses, plan, and follow up with the patient and they expressed understanding.  Patient expressed understanding of the importance of proper follow up care.   This document serves as a record of services personally performed by [name], MD, PhD. It was created on their behalf by Orvan Falconer, an ophthalmic technician. The creation of this record is the provider's dictation and/or activities during the visit.    Electronically signed by: Orvan Falconer, OA, 01/24/21  10:24 AM   [name], M.D., Ph.D. Diseases & Surgery of the Retina and Vitreous [facility]  I have reviewed the above documentation for accuracy and  completeness, and I agree with the above. [name], M.D., Ph.D. 01/18/21 10:24 AM   Abbreviations: M myopia (nearsighted); A astigmatism; H hyperopia (farsighted); P presbyopia; Mrx spectacle prescription;  CTL contact lenses; OD right eye; OS left eye; OU both eyes  XT exotropia; ET esotropia; PEK punctate epithelial keratitis; PEE punctate epithelial erosions; DES dry eye syndrome; MGD meibomian gland dysfunction; ATs artificial tears; PFAT's preservative free artificial tears; [place] nuclear sclerotic cataract; PSC posterior subcapsular cataract; ERM epi-retinal membrane; PVD posterior vitreous detachment; RD retinal detachment; DM diabetes mellitus; DR diabetic retinopathy; NPDR non-proliferative diabetic retinopathy; PDR proliferative diabetic retinopathy; CSME clinically significant macular edema; DME diabetic macular edema; dbh dot blot hemorrhages; CWS cotton wool spot; POAG primary open angle glaucoma; C/D cup-to-disc ratio; HVF humphrey visual field; GVF goldmann visual field; OCT optical coherence tomography; IOP intraocular pressure; BRVO Branch retinal vein occlusion; CRVO central retinal vein occlusion; CRAO central retinal artery occlusion; BRAO branch retinal artery occlusion; RT retinal tear; SB scleral buckle; PPV pars plana vitrectomy; VH Vitreous hemorrhage; PRP panretinal laser photocoagulation; IVK intravitreal kenalog; VMT vitreomacular traction; MH Macular hole;  NVD neovascularization of the disc; NVE neovascularization elsewhere; AREDS age related eye disease study; ARMD age related macular degeneration; POAG primary open angle glaucoma; EBMD epithelial/anterior basement membrane dystrophy; ACIOL anterior chamber intraocular lens; IOL intraocular lens; PCIOL posterior chamber intraocular lens; Phaco/IOL phacoemulsification with intraocular lens placement; [place] photorefractive keratectomy; LASIK laser assisted in situ keratomileusis; HTN hypertension; DM diabetes mellitus; COPD  chronic obstructive pulmonary disease

## 2021-01-25 ENCOUNTER — Encounter (INDEPENDENT_AMBULATORY_CARE_PROVIDER_SITE_OTHER): Payer: HMO | Admitting: Ophthalmology

## 2021-01-25 DIAGNOSIS — S42102D Fracture of unspecified part of scapula, left shoulder, subsequent encounter for fracture with routine healing: Secondary | ICD-10-CM | POA: Diagnosis not present

## 2021-01-25 DIAGNOSIS — L89150 Pressure ulcer of sacral region, unstageable: Secondary | ICD-10-CM | POA: Diagnosis not present

## 2021-01-26 ENCOUNTER — Other Ambulatory Visit: Payer: Self-pay

## 2021-01-26 ENCOUNTER — Encounter (HOSPITAL_BASED_OUTPATIENT_CLINIC_OR_DEPARTMENT_OTHER): Payer: HMO | Admitting: Internal Medicine

## 2021-01-26 DIAGNOSIS — E11621 Type 2 diabetes mellitus with foot ulcer: Secondary | ICD-10-CM | POA: Diagnosis not present

## 2021-01-26 DIAGNOSIS — E10621 Type 1 diabetes mellitus with foot ulcer: Secondary | ICD-10-CM | POA: Diagnosis not present

## 2021-01-26 DIAGNOSIS — E1142 Type 2 diabetes mellitus with diabetic polyneuropathy: Secondary | ICD-10-CM | POA: Diagnosis not present

## 2021-01-26 DIAGNOSIS — L97512 Non-pressure chronic ulcer of other part of right foot with fat layer exposed: Secondary | ICD-10-CM | POA: Diagnosis not present

## 2021-01-26 DIAGNOSIS — E1151 Type 2 diabetes mellitus with diabetic peripheral angiopathy without gangrene: Secondary | ICD-10-CM | POA: Diagnosis not present

## 2021-01-26 NOTE — Progress Notes (Signed)
ABBAS, BEYENE ([id number]) Visit Report for 01/26/2021 Arrival Information Details Patient Name: Date of Service: [name] THA N L. 01/26/2021 9:30 A M Medical Record Number: [id number] Patient Account Number: [id number] Date of Birth/Sex: Treating RN: [birth year] (66 y.o. [name], [name] Primary Care Kiam Bransfield: [name] Other Clinician: Referring Merida Alcantar: Treating Gianpaolo Mindel/Extender: [name] in Treatment: 29 Visit Information History Since Last Visit Added or deleted any medications: No Patient Arrived: Walker Any new allergies or adverse reactions: No Arrival Time: 09:20 Had a fall or experienced change in No Accompanied By: mother activities of daily living that may affect Transfer Assistance: None risk of falls: Secondary Verification Process Completed: Yes Signs or symptoms of abuse/neglect since last visito No Patient Requires Transmission-Based Precautions: No Hospitalized since last visit: No Patient Has Alerts: Yes Implantable device outside of the clinic excluding No Patient Alerts: Patient on Blood Thinner cellular tissue based products placed in the center since last visit: Has Dressing in Place as Prescribed: Yes Pain Present Now: Yes Electronic Signature(s) Signed: 01/26/2021 6:02:21 PM By: [name] RN, BSN Entered By: [name] on 01/26/2021 09:30:18 -------------------------------------------------------------------------------- Clinic Level of Care Assessment Details Patient Name: Date of Service: [name] THA N L. 01/26/2021 9:30 A M Medical Record Number: [id number] Patient Account Number: [id number] Date of Birth/Sex: Treating RN: [birth year] (66 y.o. [name], [name] Primary Care Almeta Geisel: [name] Other Clinician: Referring Cheyrl Buley: Treating Chaley Castellanos/Extender: [name] in Treatment: 29 Clinic Level of Care Assessment Items TOOL 4 Quantity Score X- 1 0 Use when  only an EandM is performed on FOLLOW-UP visit ASSESSMENTS - Nursing Assessment / Reassessment X- 1 10 Reassessment of Co-morbidities (includes updates in patient status) X- 1 5 Reassessment of Adherence to Treatment Plan ASSESSMENTS - Wound and Skin A ssessment / Reassessment X - Simple Wound Assessment / Reassessment - one wound 1 5 []  - 0 Complex Wound Assessment / Reassessment - multiple wounds X- 1 10 Dermatologic / Skin Assessment (not related to wound area) ASSESSMENTS - Focused Assessment X- 1 5 Circumferential Edema Measurements - multi extremities X- 1 10 Nutritional Assessment / Counseling / Intervention []  - 0 Lower Extremity Assessment (monofilament, tuning fork, pulses) []  - 0 Peripheral Arterial Disease Assessment (using hand held doppler) ASSESSMENTS - Ostomy and/or Continence Assessment and Care []  - 0 Incontinence Assessment and Management []  - 0 Ostomy Care Assessment and Management (repouching, etc.) PROCESS - Coordination of Care X - Simple Patient / Family Education for ongoing care 1 15 []  - 0 Complex (extensive) Patient / Family Education for ongoing care X- 1 10 Staff obtains Consents, Records, T Results / Process Orders est X- 1 10 Staff telephones HHA, Nursing Homes / Clarify orders / etc []  - 0 Routine Transfer to another Facility (non-emergent condition) []  - 0 Routine Hospital Admission (non-emergent condition) []  - 0 New Admissions / Biomedical engineer / Ordering NPWT Apligraf, etc. , []  - 0 Emergency Hospital Admission (emergent condition) X- 1 10 Simple Discharge Coordination []  - 0 Complex (extensive) Discharge Coordination PROCESS - Special Needs []  - 0 Pediatric / Minor Patient Management []  - 0 Isolation Patient Management []  - 0 Hearing / Language / Visual special needs []  - 0 Assessment of Community assistance (transportation, D/C planning, etc.) []  - 0 Additional assistance / Altered mentation []  - 0 Support  Surface(s) Assessment (bed, cushion, seat, etc.) INTERVENTIONS - Wound Cleansing / Measurement X - Simple Wound Cleansing - one wound 1  5 []  - 0 Complex Wound Cleansing - multiple wounds X- 1 5 Wound Imaging (photographs - any number of wounds) []  - 0 Wound Tracing (instead of photographs) X- 1 5 Simple Wound Measurement - one wound []  - 0 Complex Wound Measurement - multiple wounds INTERVENTIONS - Wound Dressings X - Small Wound Dressing one or multiple wounds 1 10 []  - 0 Medium Wound Dressing one or multiple wounds []  - 0 Large Wound Dressing one or multiple wounds []  - 0 Application of Medications - topical []  - 0 Application of Medications - injection INTERVENTIONS - Miscellaneous []  - 0 External ear exam []  - 0 Specimen Collection (cultures, biopsies, blood, body fluids, etc.) []  - 0 Specimen(s) / Culture(s) sent or taken to Lab for analysis []  - 0 Patient Transfer (multiple staff / Civil Service fast streamer / Similar devices) []  - 0 Simple Staple / Suture removal (25 or less) []  - 0 Complex Staple / Suture removal (26 or more) []  - 0 Hypo / Hyperglycemic Management (close monitor of Blood Glucose) []  - 0 Ankle / Brachial Index (ABI) - do not check if billed separately X- 1 5 Vital Signs Has the patient been seen at the hospital within the last three years: Yes Total Score: 120 Level Of Care: New/Established - Level 4 Electronic Signature(s) Signed: 01/26/2021 6:02:21 PM By: [name] RN, BSN Entered By: [name] on 01/26/2021 09:42:05 -------------------------------------------------------------------------------- Encounter Discharge Information Details Patient Name: Date of Service: [name] THA N L. 01/26/2021 9:30 A M Medical Record Number: [id number] Patient Account Number: [id number] Date of Birth/Sex: Treating RN: June 01, 1955 (66 y.o. [name] Primary Care Felicha Frayne: [name] Other Clinician: Referring Syrina Wake: Treating Shamariah Shewmake/Extender:  [name] in Treatment: 29 Encounter Discharge Information Items Discharge Condition: Stable Ambulatory Status: Walker Discharge Destination: Home Transportation: Private Auto Accompanied By: mother Schedule Follow-up Appointment: Yes Clinical Summary of Care: Electronic Signature(s) Signed: 01/26/2021 6:02:21 PM By: [name] RN, BSN Entered By: [name] on 01/26/2021 09:42:37 -------------------------------------------------------------------------------- Lower Extremity Assessment Details Patient Name: Date of Service: [name] THA N L. 01/26/2021 9:30 A M Medical Record Number: [id number] Patient Account Number: [id number] Date of Birth/Sex: Treating RN: 01-Feb-1955 (66 y.o. [name] Primary Care Geanine Vandekamp: [name] Other Clinician: Referring Sidharth Leverette: Treating Michele Judy/Extender: [name] in Treatment: 29 Edema Assessment Assessed: [name]: No] [name]: Yes] Edema: [Left: Yes] [Right: Yes] Calf Left: Right: Point of Measurement: 34 cm From Medial Instep 35 cm Ankle Left: Right: Point of Measurement: 10 cm From Medial Instep 25 cm Vascular Assessment Pulses: Dorsalis Pedis Palpable: [Right:Yes] Notes BLE rash like redness with dry skin noted. Electronic Signature(s) Signed: 01/26/2021 6:02:21 PM By: [name] RN, BSN Entered By: [name] on 01/26/2021 09:31:30 -------------------------------------------------------------------------------- Multi Wound Chart Details Patient Name: Date of Service: [name] TH[facility] N L. 01/26/2021 9:30 A M Medical Record Number: [id number] Patient Account Number: [id number] Date of Birth/Sex: Treating RN: Aug 24, 1955 (66 y.o. [name] Primary Care Zyen Triggs: [name] Other Clinician: Referring Camdon Saetern: Treating Wanetta Funderburke/Extender: [name] in Treatment: 29 Vital Signs Height(in): 5 Pulse(bpm):  52 Weight(lbs): 151 Blood Pressure(mmHg): 153/76 Body Mass Index(BMI): 19.4 Temperature(F): 98.8 Respiratory Rate(breaths/min): 18 Photos: [N/A:N/A] Right, Lateral Foot N/A N/A Wound Location: Gradually Appeared N/A N/A Wounding Event: Diabetic Wound/Ulcer of the Lower N/A N/A Primary Etiology: Extremity Coronary Artery Disease, N/A N/A Comorbid History: Hypertension, Type II Diabetes, Osteoarthritis, Neuropathy 06/21/2020 N/A N/A Date Acquired: 29  N/A N/A Weeks of Treatment: Open N/A N/A Wound Status: No N/A N/A Wound Recurrence: 0.7x0.6x0.1 N/A N/A Measurements L x W x D (cm) 0.33 N/A N/A A (cm) : rea 0.033 N/A N/A Volume (cm) : 83.50% N/A N/A % Reduction in A rea: 91.80% N/A N/A % Reduction in Volume: Grade 2 N/A N/A Classification: Medium N/A N/A Exudate A mount: Serosanguineous N/A N/A Exudate Type: red, brown N/A N/A Exudate Color: Well defined, not attached N/A N/A Wound Margin: Large (67-100%) N/A N/A Granulation A mount: Pink, Friable N/A N/A Granulation Quality: Small (1-33%) N/A N/A Necrotic A mount: Fat Layer (Subcutaneous Tissue): Yes N/A N/A Exposed Structures: Fascia: No Tendon: No Muscle: No Joint: No Bone: No Large (67-100%) N/A N/A Epithelialization: Treatment Notes Wound #3 (Foot) Wound Laterality: Right, Lateral Cleanser Soap and Water Discharge Instruction: May shower and wash wound with dial antibacterial soap and water prior to dressing change. Wound Cleanser Discharge Instruction: Cleanse the wound with wound cleanser prior to applying a clean dressing using gauze sponges, not tissue or cotton balls. Peri-Wound Care Topical Primary Dressing PolyMem Silver Non-Adhesive Dressing, 4.25x4.25 in Discharge Instruction: Apply to wound bed as instructed Secondary Dressing Woven Gauze Sponge, Non-Sterile 4x4 in Discharge Instruction: Apply over primary dressing as directed. Secured With [employer], 4.5x3.1  (in/yd) Discharge Instruction: Secure with Kerlix as directed. 37M Medipore H Soft Cloth Surgical T 4 x 2 (in/yd) ape Discharge Instruction: Secure dressing with tape as directed. Compression Wrap Compression Stockings Add-Ons Electronic Signature(s) Signed: 01/26/2021 6:02:21 PM By: [name] RN, BSN Signed: 01/26/2021 6:04:31 PM By: Linton Ham MD Entered By: Linton Ham on 01/26/2021 09:43:16 -------------------------------------------------------------------------------- Multi-Disciplinary Care Plan Details Patient Name: Date of Service: [name] TH[facility] N L. 01/26/2021 9:30 A M Medical Record Number: [id number] Patient Account Number: [id number] Date of Birth/Sex: Treating RN: 05-Nov-1955 (66 y.o. [name], [name] Primary Care Tamecia Mcdougald: [name] Other Clinician: Referring Aaliyah Gavel: Treating Zeniya Lapidus/Extender: [name] in Treatment: 29 Multidisciplinary Care Plan reviewed with physician Active Inactive Necrotic Tissue Nursing Diagnoses: Impaired tissue integrity related to necrotic/devitalized tissue Knowledge deficit related to management of necrotic/devitalized tissue Goals: Necrotic/devitalized tissue will be minimized in the wound bed Date Initiated: 09/22/2020 Target Resolution Date: 03/03/2021 Goal Status: Active Patient/caregiver will verbalize understanding of reason and process for debridement of necrotic tissue Date Initiated: 09/22/2020 Date Inactivated: 10/06/2020 Target Resolution Date: 10/28/2020 Goal Status: Met Interventions: Provide education on necrotic tissue and debridement process Treatment Activities: Enzymatic debridement : 09/22/2020 Excisional debridement : 09/22/2020 Notes: Wound/Skin Impairment Nursing Diagnoses: Knowledge deficit related to ulceration/compromised skin integrity Goals: Patient/caregiver will verbalize understanding of skin care regimen Date Initiated: 07/07/2020 Target Resolution Date:  03/08/2021 Goal Status: Active Interventions: Assess patient/caregiver ability to obtain necessary supplies Assess patient/caregiver ability to perform ulcer/skin care regimen upon admission and as needed Provide education on ulcer and skin care Treatment Activities: Skin care regimen initiated : 07/07/2020 Topical wound management initiated : 07/07/2020 Notes: Electronic Signature(s) Signed: 01/26/2021 6:02:21 PM By: [name] RN, BSN Entered By: [name] on 01/26/2021 09:32:24 -------------------------------------------------------------------------------- Pain Assessment Details Patient Name: Date of Service: [name] TH[facility] N L. 01/26/2021 9:30 A M Medical Record Number: [id number] Patient Account Number: [id number] Date of Birth/Sex: Treating RN: 06-Jul-1955 (66 y.o. [name] Primary Care Yaseen Gilberg: [name] Other Clinician: Referring Warren Lindahl: Treating Prayan Ulin/Extender: [name] in Treatment: 29 Active Problems Location of Pain Severity and Description of Pain Patient Has Paino Yes Site Locations  Pain Location: Generalized Pain Rate the pain. Current Pain Level: 6 Pain Management and Medication Current Pain Management: Medication: No Cold Application: No Rest: No Massage: No Activity: No T.E.N.S.: No Heat Application: No Leg drop or elevation: No Is the Current Pain Management Adequate: Inadequate How does your wound impact your activities of daily livingo Sleep: No Bathing: No Appetite: No Relationship With Others: No Bladder Continence: No Emotions: No Bowel Continence: No Work: No Toileting: No Drive: No Dressing: No Hobbies: No Notes Per patient Chronic pain. Electronic Signature(s) Signed: 01/26/2021 6:02:21 PM By: [name] RN, BSN Entered By: [name] on 01/26/2021 09:30:57 -------------------------------------------------------------------------------- Patient/Caregiver Education  Details Patient Name: Date of Service: [name] THA N Carlean Jews 1/26/2023andnbsp9:30 A M Medical Record Number: [id number] Patient Account Number: [id number] Date of Birth/Gender: Treating RN: 1955/04/23 (66 y.o. [name] Primary Care Physician: [name] Other Clinician: Referring Physician: Treating Physician/Extender: [name] in Treatment: 29 Education Assessment Education Provided To: Patient Education Topics Provided Wound/Skin Impairment: Handouts: Skin Care Do's and Dont's Methods: Explain/Verbal Responses: Reinforcements needed Electronic Signature(s) Signed: 01/26/2021 6:02:21 PM By: [name] RN, BSN Entered By: [name] on 01/26/2021 09:32:36 -------------------------------------------------------------------------------- Wound Assessment Details Patient Name: Date of Service: [name] THA N L. 01/26/2021 9:30 A M Medical Record Number: [id number] Patient Account Number: [id number] Date of Birth/Sex: Treating RN: Mar 01, 1955 (66 y.o. [name], [name] Primary Care Samel Bruna: [name] Other Clinician: Referring Angelli Baruch: Treating Ruel Dimmick/Extender: [name] in Treatment: 29 Wound Status Wound Number: 3 Primary Diabetic Wound/Ulcer of the Lower Extremity Etiology: Wound Location: Right, Lateral Foot Wound Open Wounding Event: Gradually Appeared Status: Date Acquired: 06/21/2020 Comorbid Coronary Artery Disease, Hypertension, Type II Diabetes, Weeks Of Treatment: 29 History: Osteoarthritis, Neuropathy Clustered Wound: No Photos Wound Measurements Length: (cm) 0.7 Width: (cm) 0.6 Depth: (cm) 0.1 Area: (cm) 0.33 Volume: (cm) 0.033 % Reduction in Area: 83.5% % Reduction in Volume: 91.8% Epithelialization: Large (67-100%) Tunneling: No Undermining: No Wound Description Classification: Grade 2 Wound Margin: Well defined, not attached Exudate Amount: Medium Exudate Type:  Serosanguineous Exudate Color: red, brown Foul Odor After Cleansing: No Slough/Fibrino Yes Wound Bed Granulation Amount: Large (67-100%) Exposed Structure Granulation Quality: Pink, Friable Fascia Exposed: No Necrotic Amount: Small (1-33%) Fat Layer (Subcutaneous Tissue) Exposed: Yes Necrotic Quality: Adherent Slough Tendon Exposed: No Muscle Exposed: No Joint Exposed: No Bone Exposed: No Treatment Notes Wound #3 (Foot) Wound Laterality: Right, Lateral Cleanser Soap and Water Discharge Instruction: May shower and wash wound with dial antibacterial soap and water prior to dressing change. Wound Cleanser Discharge Instruction: Cleanse the wound with wound cleanser prior to applying a clean dressing using gauze sponges, not tissue or cotton balls. Peri-Wound Care Topical Primary Dressing PolyMem Silver Non-Adhesive Dressing, 4.25x4.25 in Discharge Instruction: Apply to wound bed as instructed Secondary Dressing Woven Gauze Sponge, Non-Sterile 4x4 in Discharge Instruction: Apply over primary dressing as directed. Secured With [employer], 4.5x3.1 (in/yd) Discharge Instruction: Secure with Kerlix as directed. 28M Medipore H Soft Cloth Surgical T 4 x 2 (in/yd) ape Discharge Instruction: Secure dressing with tape as directed. Compression Wrap Compression Stockings Add-Ons Electronic Signature(s) Signed: 01/26/2021 6:02:21 PM By: [name] RN, BSN Entered By: [name] on 01/26/2021 09:32:03 -------------------------------------------------------------------------------- Vitals Details Patient Name: Date of Service: [name] THA N L. 01/26/2021 9:30 A M Medical Record Number: [id number] Patient Account Number: [id number] Date of Birth/Sex: Treating RN: [birth year] (66 y.o. [name] Primary Care  Antario Yasuda: [name] Other Clinician: Referring Waylen Depaolo: Treating Sangita Zani/Extender: [name] in Treatment: 29 Vital  Signs Time Taken: 09:20 Temperature (F): 98.8 Height (in): 74 Pulse (bpm): 73 Weight (lbs): 151 Respiratory Rate (breaths/min): 18 Body Mass Index (BMI): 19.4 Blood Pressure (mmHg): 153/76 Reference Range: 80 - 120 mg / dl Electronic Signature(s) Signed: 01/26/2021 6:02:21 PM By: [name] RN, BSN Entered By: [name] on 01/26/2021 09:30:36

## 2021-01-26 NOTE — Progress Notes (Signed)
[name] [name], [name] [name] ([id number]) Visit Report for 01/26/2021 HPI Details Patient Name: Date of Service: [name] [name] [facility] THA N L. 01/26/2021 9:30 A M Medical Record Number: [id number] Patient Account Number: [id number] Date of Birth/Sex: Treating RN: 06-14-1955 (66 y.o. [name] Primary Care Provider: [name] Other Clinician: Referring Provider: Treating Provider/Extender: [name] in Treatment: 29 History of Present Illness HPI Description: ADMISSION 07/07/2020 This is a 66 year old man who is a type II diabetic. I have reviewed this in epic. It appeared that his diabetes was diagnosed in 2009 at which time he was on metformin. He was followed for a period of time by an endocrinologist with [facility] to always labeled him as a type II diabetic. Recently in epic there is been a switch to type 1 diabetes. If there were defining antibody test done I do not see them and I think the totality of the evidence would suggest that he is a type II diabetic. His recent problem started in May when he fell down 17 stairs. He suffered a scapular fracture he was hospitalized at [facility] for 9 days and then discharged to [facility]. His mother who is present said that most of these wounds seem to develop surrounding the discharge from hospital or the stay at [facility]. He has an area on his lower sacrum which appears currently to be a stage II but per their description this is actually improved quite a bit. He has an area on the left lateral foot at roughly the base of the fifth metatarsal, the left Achilles heel and the right fifth metatarsal head laterally they have been using topical antibiotics. Seen by primary care yesterday and placed on Keflex The patient had arterial studies on 05/05/2020. I wondered whether this had something to do with wounds on his feet at that time but I have not been able to determine this. At that time his ABI on the right was 1.09 with a TBI of  0.65 on the left 0.88 with a TBI of 0.44. Waveforms were triphasic on the right monophasic on the left. He likely has significant PAD on the left Past medical history includes type 2 diabetes poorly controlled last hemoglobin A1c at 12.7 in May, hypertension, hyperlipidemia, atrial fib on Eliquis, coronary artery disease status post CABG, left scapular fracture in May of this year, AAA repair. Bilateral leg weakness ABIs were not done in our clinic as they were just done in May of this year at vein [facility] vascular 7/14; patient arrives back in clinic he has areas on the left Achilles heel, base of the left fifth metatarsal, right lateral fifth metatarsal. The area he had over the coccyx appears to be healed but this area remains vulnerable. We have been using Iodoflex. I rereviewed his ABIs which were done in May. I do not know exactly what prompted these test however they do show a TBI of 0.44 on the left and monophasic waveforms. 7/21; the patient's wounds are about the same. Certainly no healing and the nurses noted some green drainage on the right fifth metatarsal head, left Achilles a little larger. We have been using silver alginate really without any improvement. We did get an appointment with vascular surgery however its that it did the end of August and they seem to want to repeat the noninvasive test that they have already done in May. I am not sure what the issue here is. We advised the patient's sister to call and point to  the idea that they have already had noninvasive studies. The real question I have is does this patient need an angiogram. He is a diabetic and it is possible that the ABIs are falsely elevated because of medial calcification. At least on the left his TBI was abnormal 08/08/2020 upon evaluation today patient appears to be doing about the same in regard to his wounds. He still waiting the appointment with vascular. This is something that hopefully should be undertaken shortly.  The 29th of this month is when he is actually scheduled although they have put in a call to move this up if at all possible. Fortunately there does not appear to be any signs of active infection at this time. No fevers, chills, nausea, vomiting, or diarrhea. 8/18; appointment with Dr. Trula Slade on 8/29. The patient has wounds on the left Achilles heel, left lateral foot and the right lateral fifth metatarsal head. We have been using Santyl under Hydrofera Blue. The surface of especially the left heel looks better to me than what I remember. 9/1; since the patient was last here he underwent angiography by Dr. Trula Slade he also underwent noninvasive studies first showing a ABI of of 0.64 with monophasic waveforms. His TBI was 0.35 with a left great toe pressure of 46 this is about the same pattern as we saw previously. The left was a lot worse than the right. He underwent an angiogram on 08/29/2020. He had multiple 80% peroneal artery stenosis which was his dominant vessel which was treated with angioplasty with no residual stenosis. 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 The anterior tibial artery was also occluded. An attempt was made at recanalization of the anterior tibial artery however this was unsuccessful. Noted that the peroneal artery reconstitutes the posterior tibial artery and the anterior tibial artery creating the plantar arch. The patient has areas on the Achilles left heel left lateral foot and right lateral fifth metatarsal head we have been using Santyl and Hydrofera Blue 9/22; since the patient was last here he went for repeat vascular studies. On the right his ABI was 0.94 with biphasic waveforms TBI of 0.73 with a great toe pressure of 75. On the left which is the problematic wound his ABI was 1.08 but with biphasic waveforms at the PTA. Biphasic waveforms at the dorsalis pedis his TBI was 0.63 with a great toe pressure of 65. On the left however this does represent a fairly  marked improvement. We have been using Santyl and Hydrofera Blue 9/29 left Achilles area. Still is having necrotic surface. Bleeds very easily as the patient is on a combination of Eliquis aspirin and Plavix just stopped yesterday. I am not going to do any debridement until next week. We are using Santyl and Hydrofera Blue. His arterial status on the right was actually quite good. The area on the fifth metatarsal head needs additional debridement. A total contact cast in this area is not out of the question. He does describe claudication I believe with minimal activity. He is not very active X-ray we ordered last week is still pending in terms of the report 10/6 left Achilles area. In terms of the necrotic surface this is quite a bit better. There is still some debris in the posterior part of the wound however this looks better. Anteriorly the wound appears to be epithelialized there is still a divot in the middle of this but this does not probe to deep tissue. The area on the right fifth metatarsal head laterally really  is not making as much progress as I would have liked. Still not completely a viable surface. Both the patient and his wife agree that he is not an external rotator in terms of ambulation [an outie]. We have been using Santyl and Hydrofera Blue for a prolonged period. On the left Achilles which looked like it is worse wound this seems to be making some progress not so much on the right foot. The patient and his wife state that they are rigorous about offloading both of these areas He was treated with angioplasty as I believe on the right peroneal artery that had multiple stenosis. This is a dominant vessel the anterior tibial and posterior tibial arteries are occluded however the foot is fed via collaterals from the peroneal. From my notes I am not able to understand the vascular supply on the left I will have to review this. 10/13; left Achilles top part of this looks healthy  granulation in the bottom part of this wound again completely necrotic requiring debridement. The area on the right lateral fifth met head also requires debridement. The majority of this is epithelialized however he she there is a refractory part to this. 10/20; left Achilles 100% better looking wound surface. Necrotic upper 50% seems to have resolved at least by inspection under illumination. Culture I did of this area last week postdebridement showed Proteus and Citrobacter. Both sensitive to quinolones and I put him on ciprofloxacin. We have been using silver alginate on the wound on the heel and Santyl and Hydrofera Blue on the right fifth met head. 10/27; left Achilles is measuring much smaller we are using topical gentamicin under silver alginate. Right fifth met head still fibrinous debris on the surface we are using Santyl and Hydrofera Blue. Patient lost his brother suddenly last week he was unable to keep his vascular surgery appointment. 11/3; left Achilles not as viable surface as I might like. Some hypergranulation. Right fifth metatarsal head still completely nonviable surface. It turns out that she was not using topical gentamicin on the left heel [mother] they have been using Santyl and Hydrofera Blue on the right fifth metatarsal head. The patient did have noninvasive vascular studies on 10/ 28/22. He had triphasic waveforms up to the level of the proximal PTA and then a 50 to 74% stenosis in the mid PTA. Peroneal artery was triphasic as was the anterior tibial. He follows up with Dr. Trula Slade on 11/7. 11/17; since the patient was last here he saw Dr. Trula Slade and went for ANGIOGRAPHY on 11/15/2020. He has wounds on the left Achilles heel and the right lateral fifth metatarsal head. He has known PAD and had previous revascularization in August. On this occasion on the right his right common femoral, profundofemoral and superficial femoral and popliteal arteries were widely patent the  dominant vessel is the posterior tibial which had several areas of stenosis greater than 80% he went underwent angioplasty in this area. He had no interventions on the left however he is left common femoral, profundofemoral and superficial femoral arteries were widely patent as well as the popliteal he has recurrent stenosis greater than 50% within the peroneal artery which is a single- vessel runoff. The thought was that he may require further angiography and reangioplasty on the left peroneal artery. Overall the patient has severe PAD he has undergone angioplasty of the right posterior tibial artery. He arrives in clinic today with the Achilles heel area on the left surprisingly better with marked improvement in the surface  area on the right not nearly as good but stable 12/8; patient has follow-up arterial Doppler studies on 12/19 at the [facility] vascular. He is now being revascularized on both sides. Fortunately on the right Achilles area this is just about closed slight scabbing/eschar. I did not remove this today. The area on the right fifth met head looked better after last week's debridement we have been using Santyl and Hydrofera Blue 01/05/21 the patient has not been here in almost a month. He was supposed to have follow-up Doppler studies on 12/19 look these up. He has been revascularized on both sides The left Achilles area is closed he does not need to dress this but will need to protect this area on an ongoing basis likely indefinitely. He still has the area on the right fifth plantar metatarsal head. The area is measuring smaller he has a rim of epithelialization. The granulation tissue still a bit gelatinous for my liking 1/26; The left Achilles remains closed. Plantar right fifth metatarsal head is smaller and appears to be healthy. He has not followed up with vascular surgery and does not seem particularly interested. He did not get his follow-up arterial Dopplers. I explained to him  that angioplasty of the small arteries in his leg is a limb saving procedure but possibility of restenosis is reasonably high and he should follow-up with follow-up noninvasive studies. Frankly he did not seem interested today Electronic Signature(s) Signed: 01/26/2021 6:04:31 PM By: Linton Ham MD Entered By: Linton Ham on 01/26/2021 09:47:25 -------------------------------------------------------------------------------- Physical Exam Details Patient Name: Date of Service: [name] [name] [facility] THA N L. 01/26/2021 9:30 A M Medical Record Number: [id number] Patient Account Number: [id number] Date of Birth/Sex: Treating RN: [birth year] (66 y.o. [name] Primary Care Provider: [name] Other Clinician: Referring Provider: Treating Provider/Extender: [name] in Treatment: 29 Constitutional Patient is hypertensive.. Pulse regular and within target range for patient.Marland Kitchen Respirations regular, non-labored and within target range.. Temperature is normal and within the target range for the patient.Marland Kitchen Appears in no distress. Cardiovascular Needle pulses are not palpable in the right foot but the popliteal pulses palpable. Notes Wound exam; left Achilles is nicely epithelialized. We healed this out several weeks ago. I checked it today no problems Right fifth metatarsal head plantar aspect the wound continues to epithelialize and is smaller. I do not know about his offloading looking at his slippers however he continues to do fairly well. Electronic Signature(s) Signed: 01/26/2021 6:04:31 PM By: Linton Ham MD Entered By: Linton Ham on 01/26/2021 09:48:33 -------------------------------------------------------------------------------- Physician Orders Details Patient Name: Date of Service: [name] [name] [facility] THA N L. 01/26/2021 9:30 A M Medical Record Number: [id number] Patient Account Number: [id number] Date of Birth/Sex: Treating RN: 1955-05-06 (66  y.o. [name], [name] Primary Care Provider: [name] Other Clinician: Referring Provider: Treating Provider/Extender: [name] in Treatment: 29 Verbal / Phone Orders: No Diagnosis Coding ICD-10 Coding Code Description E11.621 Type 2 diabetes mellitus with foot ulcer E11.51 Type 2 diabetes mellitus with diabetic peripheral angiopathy without gangrene L97.511 Non-pressure chronic ulcer of other part of right foot limited to breakdown of skin E11.42 Type 2 diabetes mellitus with diabetic polyneuropathy Follow-up Appointments Return appointment in 3 weeks. - Dr. Dellia Nims Other: - Please call [facility] to reschedule your vascular tests the vascular office scheduled you. Call them at [phone]. Bathing/ Shower/ Hygiene May shower and wash wound with soap and water. - with dressing changes only. Edema Control -  Lymphedema / SCD / Other Elevate legs to the level of the heart or above for 30 minutes daily and/or when sitting, a frequency of: - 3-4 times a day throughout the day. Moisturize legs daily. - every night before bed. *** closely monitor feet every night before bed. Off-Loading Other: - float heels while resting in bed or chair. Use a pillow or rolled sheet under legs to offload the heels. Ensure no pressure to right lateral foot. Home Health No change in wound care orders this week; continue Home Health for wound care. May utilize formulary equivalent dressing for wound treatment orders unless otherwise specified. Other Home Health Orders/Instructions: - [facility] home health weekly changes and all other days family to change. Wound Treatment Wound #3 - Foot Wound Laterality: Right, Lateral Cleanser: Soap and Water [facility]) Every Other Day/30 Days Discharge Instructions: May shower and wash wound with dial antibacterial soap and water prior to dressing change. Cleanser: Wound Cleanser [facility]) Every Other Day/30 Days Discharge  Instructions: Cleanse the wound with wound cleanser prior to applying a clean dressing using gauze sponges, not tissue or cotton balls. Prim Dressing: PolyMem Silver Non-Adhesive Dressing, 4.25x4.25 in [facility]) Every Other Day/30 Days ary Discharge Instructions: Apply to wound bed as instructed Secondary Dressing: Woven Gauze Sponge, Non-Sterile 4x4 in [facility]) Every Other Day/30 Days Discharge Instructions: Apply over primary dressing as directed. Secured With: [employer], 4.5x3.1 (in/yd) [facility]) Every Other Day/30 Days Discharge Instructions: Secure with Kerlix as directed. Secured With: 30M Medipore H Soft Cloth Surgical T 4 x 2 (in/yd) (Home Health) Every Other Day/30 Days ape Discharge Instructions: Secure dressing with tape as directed. Electronic Signature(s) Signed: 01/26/2021 6:02:21 PM By: [name] RN, BSN Signed: 01/26/2021 6:04:31 PM By: Linton Ham MD Signed: 01/26/2021 6:04:31 PM By: Linton Ham MD Entered By: [name] on 01/26/2021 09:41:23 -------------------------------------------------------------------------------- Problem List Details Patient Name: Date of Service: [name] [name] [facility] THA N L. 01/26/2021 9:30 A M Medical Record Number: [id number] Patient Account Number: [id number] Date of Birth/Sex: Treating RN: [birth year] (66 y.o. [name] Primary Care Provider: [name] Other Clinician: Referring Provider: Treating Provider/Extender: [name] in Treatment: 29 Active Problems ICD-10 Encounter Code Description Active Date MDM Diagnosis E11.621 Type 2 diabetes mellitus with foot ulcer 07/07/2020 No Yes E11.51 Type 2 diabetes mellitus with diabetic peripheral angiopathy without gangrene 09/01/2020 No Yes L97.511 Non-pressure chronic ulcer of other part of right foot limited to breakdown of 07/07/2020 No Yes skin E11.42 Type 2 diabetes mellitus with diabetic polyneuropathy 07/07/2020 No  Yes Inactive Problems ICD-10 Code Description Active Date Inactive Date L97.528 Non-pressure chronic ulcer of other part of left foot with other specified severity 07/07/2020 07/07/2020 L89.152 Pressure ulcer of sacral region, stage 2 07/07/2020 07/07/2020 W26.378 Pressure ulcer of left heel, stage 2 07/07/2020 07/07/2020 Resolved Problems Electronic Signature(s) Signed: 01/26/2021 6:04:31 PM By: Linton Ham MD Entered By: Linton Ham on 01/26/2021 09:43:02 -------------------------------------------------------------------------------- Progress Note Details Patient Name: Date of Service: [name] [name] [facility] THA N L. 01/26/2021 9:30 A M Medical Record Number: [id number] Patient Account Number: [id number] Date of Birth/Sex: Treating RN: 01/11/1955 (66 y.o. [name] Primary Care Provider: Other Clinician: [name] Referring Provider: Treating Provider/Extender: [name] in Treatment: 29 Subjective History of Present Illness (HPI) ADMISSION 07/07/2020 This is a 66 year old man who is a type II diabetic. I have reviewed this in epic. It appeared that his diabetes was diagnosed in 2009 at  which time he was on metformin. He was followed for a period of time by an endocrinologist with [facility] to always labeled him as a type II diabetic. Recently in epic there is been a switch to type 1 diabetes. If there were defining antibody test done I do not see them and I think the totality of the evidence would suggest that he is a type II diabetic. His recent problem started in May when he fell down 17 stairs. He suffered a scapular fracture he was hospitalized at [facility] for 9 days and then discharged to [facility]. His mother who is present said that most of these wounds seem to develop surrounding the discharge from hospital or the stay at [facility]. He has an area on his lower sacrum which appears currently to be a stage II but per their description this is actually  improved quite a bit. He has an area on the left lateral foot at roughly the base of the fifth metatarsal, the left Achilles heel and the right fifth metatarsal head laterally they have been using topical antibiotics. Seen by primary care yesterday and placed on Keflex The patient had arterial studies on 05/05/2020. I wondered whether this had something to do with wounds on his feet at that time but I have not been able to determine this. At that time his ABI on the right was 1.09 with a TBI of 0.65 on the left 0.88 with a TBI of 0.44. Waveforms were triphasic on the right monophasic on the left. He likely has significant PAD on the left Past medical history includes type 2 diabetes poorly controlled last hemoglobin A1c at 12.7 in May, hypertension, hyperlipidemia, atrial fib on Eliquis, coronary artery disease status post CABG, left scapular fracture in May of this year, AAA repair. Bilateral leg weakness ABIs were not done in our clinic as they were just done in May of this year at vein and vascular 7/14; patient arrives back in clinic he has areas on the left Achilles heel, base of the left fifth metatarsal, right lateral fifth metatarsal. The area he had over the coccyx appears to be healed but this area remains vulnerable. We have been using Iodoflex. I rereviewed his ABIs which were done in May. I do not know exactly what prompted these test however they do show a TBI of 0.44 on the left and monophasic waveforms. 7/21; the patient's wounds are about the same. Certainly no healing and the nurses noted some green drainage on the right fifth metatarsal head, left Achilles a little larger. We have been using silver alginate really without any improvement. We did get an appointment with vascular surgery however its that it did the end of August and they seem to want to repeat the noninvasive test that they have already done in May. I am not sure what the issue here is. We advised the patient's sister  to call and point to the idea that they have already had noninvasive studies. The real question I have is does this patient need an angiogram. He is a diabetic and it is possible that the ABIs are falsely elevated because of medial calcification. At least on the left his TBI was abnormal 08/08/2020 upon evaluation today patient appears to be doing about the same in regard to his wounds. He still waiting the appointment with vascular. This is something that hopefully should be undertaken shortly. The 29th of this month is when he is actually scheduled although they have put in a  call to move this up if at all possible. Fortunately there does not appear to be any signs of active infection at this time. No fevers, chills, nausea, vomiting, or diarrhea. 8/18; appointment with Dr. Trula Slade on 8/29. The patient has wounds on the left Achilles heel, left lateral foot and the right lateral fifth metatarsal head. We have been using Santyl under Hydrofera Blue. The surface of especially the left heel looks better to me than what I remember. 9/1; since the patient was last here he underwent angiography by Dr. Trula Slade he also underwent noninvasive studies first showing a ABI of of 0.64 with monophasic waveforms. His TBI was 0.35 with a left great toe pressure of 46 this is about the same pattern as we saw previously. The left was a lot worse than the right. He underwent an angiogram on 08/29/2020. He had multiple 80% peroneal artery stenosis which was his dominant vessel which was treated with angioplasty with no residual stenosis. 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 The anterior tibial artery was also occluded. An attempt was made at recanalization of the anterior tibial artery however this was unsuccessful. Noted that the peroneal artery reconstitutes the posterior tibial artery and the anterior tibial artery creating the plantar arch. The patient has areas on the Achilles left heel left lateral foot and  right lateral fifth metatarsal head we have been using Santyl and Hydrofera Blue 9/22; since the patient was last here he went for repeat vascular studies. On the right his ABI was 0.94 with biphasic waveforms TBI of 0.73 with a great toe pressure of 75. On the left which is the problematic wound his ABI was 1.08 but with biphasic waveforms at the PTA. Biphasic waveforms at the dorsalis pedis his TBI was 0.63 with a great toe pressure of 65. On the left however this does represent a fairly marked improvement. We have been using Santyl and Hydrofera Blue 9/29 left Achilles area. Still is having necrotic surface. Bleeds very easily as the patient is on a combination of Eliquis aspirin and Plavix just stopped yesterday. I am not going to do any debridement until next week. We are using Santyl and Hydrofera Blue. His arterial status on the right was actually quite good. The area on the fifth metatarsal head needs additional debridement. A total contact cast in this area is not out of the question. He does describe claudication I believe with minimal activity. He is not very active X-ray we ordered last week is still pending in terms of the report 10/6 left Achilles area. In terms of the necrotic surface this is quite a bit better. There is still some debris in the posterior part of the wound however this looks better. Anteriorly the wound appears to be epithelialized there is still a divot in the middle of this but this does not probe to deep tissue. The area on the right fifth metatarsal head laterally really is not making as much progress as I would have liked. Still not completely a viable surface. Both the patient and his wife agree that he is not an external rotator in terms of ambulation [an outie]. We have been using Santyl and Hydrofera Blue for a prolonged period. On the left Achilles which looked like it is worse wound this seems to be making some progress not so much on the right foot. The  patient and his wife state that they are rigorous about offloading both of these areas He was treated with angioplasty as I believe  on the right peroneal artery that had multiple stenosis. This is a dominant vessel the anterior tibial and posterior tibial arteries are occluded however the foot is fed via collaterals from the peroneal. From my notes I am not able to understand the vascular supply on the left I will have to review this. 10/13; left Achilles top part of this looks healthy granulation in the bottom part of this wound again completely necrotic requiring debridement. oo The area on the right lateral fifth met head also requires debridement. The majority of this is epithelialized however he she there is a refractory part to this. 10/20; left Achilles 100% better looking wound surface. Necrotic upper 50% seems to have resolved at least by inspection under illumination. Culture I did of this area last week postdebridement showed Proteus and Citrobacter. Both sensitive to quinolones and I put him on ciprofloxacin. We have been using silver alginate on the wound on the heel and Santyl and Hydrofera Blue on the right fifth met head. 10/27; left Achilles is measuring much smaller we are using topical gentamicin under silver alginate. Right fifth met head still fibrinous debris on the surface we are using Santyl and Hydrofera Blue. Patient lost his brother suddenly last week he was unable to keep his vascular surgery appointment. 11/3; left Achilles not as viable surface as I might like. Some hypergranulation. Right fifth metatarsal head still completely nonviable surface. It turns out that she was not using topical gentamicin on the left heel [mother] they have been using Santyl and Hydrofera Blue on the right fifth metatarsal head. The patient did have noninvasive vascular studies on 10/ 28/22. He had triphasic waveforms up to the level of the proximal PTA and then a 50 to 74% stenosis in the  mid PTA. Peroneal artery was triphasic as was the anterior tibial. He follows up with Dr. Trula Slade on 11/7. 11/17; since the patient was last here he saw Dr. Trula Slade and went for ANGIOGRAPHY on 11/15/2020. He has wounds on the left Achilles heel and the right lateral fifth metatarsal head. He has known PAD and had previous revascularization in August. On this occasion on the right his right common femoral, profundofemoral and superficial femoral and popliteal arteries were widely patent the dominant vessel is the posterior tibial which had several areas of stenosis greater than 80% he went underwent angioplasty in this area. He had no interventions on the left however he is left common femoral, profundofemoral and superficial femoral arteries were widely patent as well as the popliteal he has recurrent stenosis greater than 50% within the peroneal artery which is a single- vessel runoff. The thought was that he may require further angiography and reangioplasty on the left peroneal artery. Overall the patient has severe PAD he has undergone angioplasty of the right posterior tibial artery. He arrives in clinic today with the Achilles heel area on the left surprisingly better with marked improvement in the surface area on the right not nearly as good but stable 12/8; patient has follow-up arterial Doppler studies on 12/19 at the [facility]. He is now being revascularized on both sides. Fortunately on the right Achilles area this is just about closed slight scabbing/eschar. I did not remove this today. The area on the right fifth met head looked better after last week's debridement we have been using Santyl and Hydrofera Blue 01/05/21 the patient has not been here in almost a month. He was supposed to have follow-up Doppler studies on 12/19 look these up. He  has been revascularized on both sides The left Achilles area is closed he does not need to dress this but will need to protect this area on an  ongoing basis likely indefinitely. He still has the area on the right fifth plantar metatarsal head. The area is measuring smaller he has a rim of epithelialization. The granulation tissue still a bit gelatinous for my liking 1/26; The left Achilles remains closed. Plantar right fifth metatarsal head is smaller and appears to be healthy. He has not followed up with vascular surgery and does not seem particularly interested. He did not get his follow-up arterial Dopplers. I explained to him that angioplasty of the small arteries in his leg is a limb saving procedure but possibility of restenosis is reasonably high and he should follow-up with follow-up noninvasive studies. Frankly he did not seem interested today Objective Constitutional Patient is hypertensive.. Pulse regular and within target range for patient.Marland Kitchen Respirations regular, non-labored and within target range.. Temperature is normal and within the target range for the patient.Marland Kitchen Appears in no distress. Vitals Time Taken: 9:20 AM, Height: 74 in, Weight: 151 lbs, BMI: 19.4, Temperature: 98.8 F, Pulse: 73 bpm, Respiratory Rate: 18 breaths/min, Blood Pressure: 153/76 mmHg. Cardiovascular Needle pulses are not palpable in the right foot but the popliteal pulses palpable. General Notes: Wound exam; left Achilles is nicely epithelialized. We healed this out several weeks ago. I checked it today no problems oo Right fifth metatarsal head plantar aspect the wound continues to epithelialize and is smaller. I do not know about his offloading looking at his slippers however he continues to do fairly well. Integumentary (Hair, Skin) Wound #3 status is Open. Original cause of wound was Gradually Appeared. The date acquired was: 06/21/2020. The wound has been in treatment 29 weeks. The wound is located on the Right,Lateral Foot. The wound measures 0.7cm length x 0.6cm width x 0.1cm depth; 0.33cm^2 area and 0.033cm^3 volume. There is Fat Layer  (Subcutaneous Tissue) exposed. There is no tunneling or undermining noted. There is a medium amount of serosanguineous drainage noted. The wound margin is well defined and not attached to the wound base. There is large (67-100%) pink, friable granulation within the wound bed. There is a small (1- 33%) amount of necrotic tissue within the wound bed including Adherent Slough. 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 - Dr. Dellia Nims Other: - Please call [facility] to reschedule your vascular tests the vascular office scheduled you. Call them at [phone]. Bathing/ Shower/ Hygiene: May shower and wash wound with soap and water. - with dressing changes only. Edema Control - Lymphedema / SCD / Other: Elevate legs to the level of the heart or above for 30 minutes daily and/or when sitting, a frequency of: - 3-4 times a day throughout the day. Moisturize legs daily. - every night before bed. *** closely monitor feet every night before bed. Off-Loading: Other: - float heels while resting in bed or chair. Use a pillow or rolled sheet under legs to offload the heels. Ensure no pressure to right lateral foot. Home Health: No change in wound care orders this week; continue Home Health for wound care. May utilize formulary equivalent dressing for wound treatment orders unless otherwise specified. Other Home Health Orders/Instructions: -  [facility] home health weekly changes and all other days family to change. WOUND #3: - Foot Wound Laterality: Right, Lateral Cleanser: Soap and Water [facility]) Every Other Day/30 Days Discharge Instructions: May shower and wash wound with dial antibacterial  soap and water prior to dressing change. Cleanser: Wound Cleanser [facility]) Every Other Day/30 Days Discharge Instructions: Cleanse the wound with wound cleanser prior to applying a clean dressing using gauze sponges, not tissue or cotton balls. Prim Dressing: PolyMem Silver Non-Adhesive Dressing, 4.25x4.25 in [facility]) Every Other Day/30 Days ary Discharge Instructions: Apply to wound bed as instructed Secondary Dressing: Woven Gauze Sponge, Non-Sterile 4x4 in [facility]) Every Other Day/30 Days Discharge Instructions: Apply over primary dressing as directed. Secured With: [employer], 4.5x3.1 (in/yd) [facility]) Every Other Day/30 Days Discharge Instructions: Secure with Kerlix as directed. Secured With: 67M Medipore H Soft Cloth Surgical T 4 x 2 (in/yd) (Home Health) Every Other Day/30 Days ape Discharge Instructions: Secure dressing with tape as directed. 1. I continued with polymen Ag over the wound surface. 2. I suspect he is not offloading this adequately although he continues to make progress 3. I did talk about the need to follow-up with vascular surgery especially for noninvasive vascular testing however he did not seem particularly interested Electronic Signature(s) Signed: 01/26/2021 6:04:31 PM By: Linton Ham MD Entered By: Linton Ham on 01/26/2021 09:49:18 -------------------------------------------------------------------------------- SuperBill Details Patient Name: Date of Service: [name] [name] HNA THA N L. 01/26/2021 Medical Record Number: [id number] Patient Account Number: [id number] Date of Birth/Sex: Treating RN: [birth year] (66 y.o. [name], [name] Primary Care Provider: [name] Other Clinician: Referring Provider: Treating Provider/Extender: [name] in Treatment: 29 Diagnosis Coding ICD-10 Codes Code Description E11.621 Type 2 diabetes mellitus with foot ulcer E11.51 Type 2 diabetes mellitus with diabetic  peripheral angiopathy without gangrene L97.511 Non-pressure chronic ulcer of other part of right foot limited to breakdown of skin E11.42 Type 2 diabetes mellitus with diabetic polyneuropathy Facility Procedures CPT4 Code: [id number] Description: 99214 - WOUND CARE VISIT-LEV 4 EST PT Modifier: Quantity: 1 Physician Procedures Electronic Signature(s) Signed: 01/26/2021 6:04:31 PM By: Linton Ham MD Entered By: Linton Ham on 01/26/2021 09:49:34

## 2021-01-27 ENCOUNTER — Ambulatory Visit (INDEPENDENT_AMBULATORY_CARE_PROVIDER_SITE_OTHER): Payer: HMO | Admitting: Ophthalmology

## 2021-01-27 ENCOUNTER — Encounter (INDEPENDENT_AMBULATORY_CARE_PROVIDER_SITE_OTHER): Payer: Self-pay | Admitting: Ophthalmology

## 2021-01-27 DIAGNOSIS — I10 Essential (primary) hypertension: Secondary | ICD-10-CM

## 2021-01-27 DIAGNOSIS — E113513 Type 2 diabetes mellitus with proliferative diabetic retinopathy with macular edema, bilateral: Secondary | ICD-10-CM | POA: Diagnosis not present

## 2021-01-27 DIAGNOSIS — H25813 Combined forms of age-related cataract, bilateral: Secondary | ICD-10-CM | POA: Diagnosis not present

## 2021-01-27 DIAGNOSIS — H4312 Vitreous hemorrhage, left eye: Secondary | ICD-10-CM | POA: Diagnosis not present

## 2021-01-27 DIAGNOSIS — H35033 Hypertensive retinopathy, bilateral: Secondary | ICD-10-CM | POA: Diagnosis not present

## 2021-01-27 MED ORDER — BEVACIZUMAB CHEMO INJECTION 1.25MG/0.05ML SYRINGE FOR KALEIDOSCOPE
1.2500 mg | INTRAVITREAL | Status: AC | PRN
  Administered 2021-01-27: 1.25 mg via INTRAVITREAL

## 2021-01-27 NOTE — Progress Notes (Signed)
[facility] Note  01/27/2021     CHIEF COMPLAINT Patient presents for Retina Follow Up  HISTORY OF PRESENT ILLNESS: [name] [name] is a 66 y.o. male who presents to the clinic today for:   HPI     Retina Follow Up   Patient presents with  Other.  In left eye.  This started 1 week ago.  I, the attending physician,  performed the HPI with the patient and updated documentation appropriately.        Comments   Patient here for 1 weeks retina follow up for VH OS. Patient states vision about the same. No eye pain.       Last edited by [name], MD on 01/27/2021  2:07 PM.    Pt states he can see "blobs" out of his left eye vision since he received an injection, he sleeps with his head elevated every night  Referring physician: Janith Lima, MD [place],  [place] [address]  HISTORICAL INFORMATION:   Selected notes from the MEDICAL RECORD NUMBER Referred by Dr. [name] for concern of PDR OU   CURRENT MEDICATIONS: No current outpatient medications on file. (Ophthalmic Drugs)   No current facility-administered medications for this visit. (Ophthalmic Drugs)   Current Outpatient Medications (Other)  Medication Sig   apixaban (ELIQUIS) 5 MG TABS tablet Take 1 tablet (5 mg total) by mouth 2 (two) times daily.   aspirin EC 325 MG tablet Take 325 mg by mouth daily.   atorvastatin (LIPITOR) 40 MG tablet Take 1 tablet (40 mg total) by mouth daily.   B Complex Vitamins (B COMPLEX-B12 PO) Take 1 tablet by mouth daily.    BD PEN NEEDLE NANO 2ND GEN 32G X 4 MM MISC USE FOR INSULIN   Blood Glucose Monitoring Suppl (ONETOUCH VERIO FLEX SYSTEM) w/Device KIT    cephALEXin (KEFLEX) 500 MG capsule    Cholecalciferol (VITAMIN D) 125 MCG (5000 UT) CAPS Take 5,000 Units by mouth daily.    ciprofloxacin (CIPRO) 500 MG tablet Take 500 mg by mouth 2 (two) times daily.   Continuous Blood Gluc Sensor (DEXCOM G6 SENSOR) MISC 1 Device by  Does not apply route See admin instructions. Change every 10 days   Continuous Blood Gluc Transmit (DEXCOM G6 TRANSMITTER) MISC Use as instructed to check blood sugar   cyclobenzaprine (FLEXERIL) 5 MG tablet    HYDROcodone-acetaminophen (NORCO/VICODIN) 5-325 MG tablet    hydrOXYzine (ATARAX/VISTARIL) 10 MG tablet    Insulin Aspart FlexPen (NOVOLOG) 100 UNIT/ML Inject 4 Units into the skin 2 (two) times daily with a meal. And pen needles 3/day   Insulin Disposable Pump (OMNIPOD 5 G6 INTRO, GEN 5,) KIT 1 Device by Does not apply route every 3 (three) days.   insulin glargine (LANTUS SOLOSTAR) 100 UNIT/ML Solostar Pen Inject 30 Units into the skin every morning.   Lancets (ONETOUCH DELICA PLUS [id number]) MISC Apply 1 each topically 4 (four) times daily.   lidocaine (LIDODERM) 5 % Place 1 patch onto the skin daily. Remove & Discard patch within 12 hours or as directed by MD   lisinopril (ZESTRIL) 5 MG tablet TAKE 1 TABLET BY MOUTH DAILY   mirtazapine (REMERON) 15 MG tablet Take 1 tablet (15 mg total) by mouth at bedtime.   ONETOUCH VERIO test strip 1 each 4 (four) times daily.   PARoxetine (PAXIL) 20 MG tablet Take 1 tablet (20 mg total) by mouth daily.   SANTYL ointment Apply  1 application topically daily.   traZODone (DESYREL) 50 MG tablet Take 1 tablet (50 mg total) by mouth at bedtime.   No current facility-administered medications for this visit. (Other)   REVIEW OF SYSTEMS: ROS   Positive for: Neurological, Skin, Endocrine, Cardiovascular, Eyes Negative for: Constitutional, Gastrointestinal, Genitourinary, Musculoskeletal, HENT, Respiratory, Psychiatric, Allergic/Imm, Heme/Lymph Last edited by Theodore Demark, COA on 01/27/2021  9:05 AM.     ALLERGIES No Known Allergies  PAST MEDICAL HISTORY Past Medical History:  Diagnosis Date   Anxiety    Arthritis    Cataract    Constipation    pt states goes EOD- hard stools    Coronary artery disease    Depression    Diabetes mellitus  without complication (HCC)    Diabetic retinopathy ([place])    DKA (diabetic ketoacidoses)    Hyperlipidemia    Hypertension    off meds   Hypertensive retinopathy    Psoriasis    Past Surgical History:  Procedure Laterality Date   ABDOMINAL AORTOGRAM W/LOWER EXTREMITY N/A 08/30/2020   Procedure: ABDOMINAL AORTOGRAM W/LOWER EXTREMITY;  Surgeon: Serafina Mitchell, MD;  Location: [facility];  Service: Cardiovascular;  Laterality: N/A;   ABDOMINAL AORTOGRAM W/LOWER EXTREMITY N/A 11/15/2020   Procedure: ABDOMINAL AORTOGRAM W/LOWER EXTREMITY;  Surgeon: Serafina Mitchell, MD;  Location: [facility];  Service: Cardiovascular;  Laterality: N/A;   ANKLE SURGERY     right   CARDIAC CATHETERIZATION  09/25/2001   COLONOSCOPY     ~10 yr ago- normal  per pt    CORONARY ARTERY BYPASS GRAFT  09/30/2001   CABG x 4   IR THORACENTESIS ASP PLEURAL SPACE W/IMG GUIDE  05/19/2020   LAPAROSCOPIC INGUINAL HERNIA REPAIR Bilateral 02/09/2010   MASS EXCISION Left 03/12/2013   Procedure: LEFT INDEX EXCISION MASS AND DEBRIDMENT DISTAL INTERPHALANGEAL JOINT;  Surgeon: Tennis Must, MD;  Location: [facility];  Service: Orthopedics;  Laterality: Left;   PERIPHERAL VASCULAR BALLOON ANGIOPLASTY Left 08/30/2020   Procedure: PERIPHERAL VASCULAR BALLOON ANGIOPLASTY;  Surgeon: Serafina Mitchell, MD;  Location: [facility];  Service: Cardiovascular;  Laterality: Left;  Peroneal   FAMILY HISTORY Family History  Problem Relation Age of Onset   Lymphoma Father    Cancer Paternal Grandmother        Colon Cancer   Colon cancer Paternal Grandmother    Colon polyps Neg Hx    Esophageal cancer Neg Hx    Rectal cancer Neg Hx    Stomach cancer Neg Hx    Diabetes Mellitus I Neg Hx    SOCIAL HISTORY Social History   Tobacco Use   Smoking status: Some Days    Types: Cigars   Smokeless tobacco: Never  Vaping Use   Vaping Use: Never used  Substance Use Topics   Alcohol use: Yes    Comment:  daily beer or scotch    Drug use: No       OPHTHALMIC EXAM: Base Eye Exam     Visual Acuity (Snellen - Linear)       Right Left   Dist cc 20/100 -2 CF at 2'   Dist ph cc NI          Tonometry (Tonopen, 9:02 AM)       Right Left   Pressure 05 12         Pupils       Dark Light Shape React APD   Right 3 2 Round  Brisk None   Left 3 2 Round Brisk None         Visual Fields (Counting fingers)       Left Right     Full   Restrictions Partial outer superior nasal deficiency          Extraocular Movement       Right Left    Full, Ortho Full, Ortho         Neuro/Psych     Oriented x3: Yes   Mood/Affect: Normal         Dilation     Both eyes: 1.0% Mydriacyl, 2.5% Phenylephrine @ 9:02 AM           Slit Lamp and Fundus Exam     External Exam       Right Left   External Normal Normal         Slit Lamp Exam       Right Left   Lids/Lashes Dermatochalasis - upper lid, Meibomian gland dysfunction Dermatochalasis - upper lid, Meibomian gland dysfunction   Conjunctiva/Sclera White and quiet White and quiet   Cornea arcus, 2-3+ Punctate epithelial erosions arcus, 2+ Punctate epithelial erosions   Anterior Chamber Deep and quiet Deep and quiet   Iris Round and dilated, No NVI Round and dilated, No NVI   Lens 2-3+ Nuclear sclerosis with brunescence, 2+ Cortical cataract 2-3+ Nuclear sclerosis with brunescence, 2-3+ Cortical cataract   Anterior Vitreous Vitreous syneresis, blood stained vitreous condensations persistent Vitreous syneresis, diffuse VH - improved, concentrated centrally         Fundus Exam       Right Left   Disc Pink and Sharp, +NVD - thickening obscured by heme   C/D Ratio 0.2 0.2   Macula Flat, Blunted foveal reflex, scatterd MA, +cystic changes/edema temporal fovea and macula -- increased partially obscured by heme, grossly flat   Vessels attenuated, Tortuous, +NVE just inside ST arcades attenuated, Tortuous, +NVD; NVE  along ST arcades   Periphery Attached; 360 DBH, 360 PRP with room for fill in Inferior hemisphere obscured by heme, superior retina attached with scattered DBH and +PRP changes           Refraction     Wearing Rx       Sphere Cylinder Axis Add   Right +0.25 +2.00 180 +2.50   Left +0.50 +1.75 009 +2.50           IMAGING AND PROCEDURES  Imaging and Procedures for 01/27/2021  OCT, Retina - OU - Both Eyes       Right Eye Quality was good. Central Foveal Thickness: 542. Progression has worsened. Findings include abnormal foveal contour, no SRF, intraretinal fluid, preretinal fibrosis, intraretinal hyper-reflective material (Partial PVD, interval increase in IRF greatest temporal macula nd fovea, interval worsening of NV on en face images).   Left Eye Quality was good. Progression has improved. Findings include abnormal foveal contour, no SRF, intraretinal fluid, preretinal fibrosis, macular pucker (Mild interval improvement in diffuse vitreous opacities, Retina grossly attached on scattered peripheral sweeps).   Notes *Images captured and stored on drive  Diagnosis / Impression:  PDR with DME OU OD: Partial PVD, interval increase in IRF greatest temporal macula and fovea, interval worsening of NV on en face images OS: Mild interval improvement in diffuse vitreous opacities, Retina grossly attached on scattered peripheral sweeps  Clinical management:  See below  Abbreviations: NFP - Normal foveal profile. CME - cystoid macular edema. PED - pigment epithelial detachment.  IRF - intraretinal fluid. SRF - subretinal fluid. EZ - ellipsoid zone. ERM - epiretinal membrane. ORA - outer retinal atrophy. ORT - outer retinal tubulation. SRHM - subretinal hyper-reflective material. IRHM - intraretinal hyper-reflective material      Intravitreal Injection, Pharmacologic Agent - OD - Right Eye       Time Out 01/27/2021. 10:05 AM. Confirmed correct patient, procedure, site, and patient  consented.   Anesthesia Topical anesthesia was used. Anesthetic medications included Lidocaine 2%, Proparacaine 0.5%.   Procedure Preparation included 5% betadine to ocular surface, eyelid speculum. A (32g) needle was used.   Injection: 1.25 mg Bevacizumab 1.62m/0.05ml   Route: Intravitreal, Site: Right Eye   NDC: 50242-060-01, Lot: [id number], Expiration date: 03/13/2021, Waste: 0.05 mL   Post-op Post injection exam found visual acuity of at least counting fingers. The patient tolerated the procedure well. There were no complications. The patient received written and verbal post procedure care education.             ASSESSMENT/PLAN:    ICD-10-CM   1. Proliferative diabetic retinopathy of both eyes with macular edema associated with type 2 diabetes mellitus (HCC)  E11.3513 OCT, Retina - OU - Both Eyes    Intravitreal Injection, Pharmacologic Agent - OD - Right Eye    Bevacizumab (AVASTIN) SOLN 1.25 mg    2. Vitreous hemorrhage of left eye (HCC)  H43.12     3. Essential hypertension  I10     4. Hypertensive retinopathy of both eyes  H35.033     5. Combined forms of age-related cataract of both eyes  H25.813      1. Severe proliferative diabetic retinopathy, both eyes  - s/p PRP OS (11.10.22)  - s/p PRP OD (12.02.22)  - s/p IVA OS #1 (01.18.23) for VH - A1c >15% on 10.11.22 - pt reports improved BG control with changes in insulin regimen, insulin pump being shipped - exam shows extensive neovascularization, fibrosis and hemorrhage OU  - OD with worse NVD - [facility] clearing superiorly and settling inferiorly - BCVA 20/100 OD - decreased from 20/50, CF 2' OS - FA (11.09.22) shows areas of vascular non-perfusion OU, +NVD OU and +NVE OU - OCT shows OD: Partial PVD, interval increase in IRF greatest temporal macula nd fovea, interval worsening of NV on en face images; OS: Mild interval improvement in diffuse vitreous opacities, Retina grossly attached  on scattered peripheral sweeps - discussed findings, severity of his disease, prognosis and likely need for extensive treatments -- PRP OU, multiple rounds of anti-VEGF therapy OU and possible surgery - recommend IVA OD #1 today, 01.27.23 for worsening macular edema and NVD - pt wishes to proceed with injection - RBA of procedure discussed, questions answered - informed consent obtained and signed, 01.18.23 - see procedure note - F/u week of January 30 DFE, OCT recheck OU, possible PRP fill in OD  2. Vitreous hemorrhage OS  - acute increase in VH OS (late Dec 2022) per pt report  - s/p IVA OS #1, 01.18.23 as above  - VH improving - VH precautions reviewed -- minimize activities, keep head elevated, avoid ASA/NSAIDs/blood thinners as able - f/u 1 week, DFE, OCT  3,4. Hypertensive retinopathy OU - discussed importance of tight BP control - monitor   5. Mixed Cataract OU - The symptoms of cataract, surgical options, and treatments and risks were discussed with patient - discussed diagnosis and progression - not yet visually significant - monitor for now  Ophthalmic Meds Ordered this visit:  Meds ordered this encounter  Medications   Bevacizumab (AVASTIN) SOLN 1.25 mg     Return for f/u week of January 30, PDR OU, DFE, OCT.  There are no Patient Instructions on file for this visit.  Explained the diagnoses, plan, and follow up with the patient and they expressed understanding.  Patient expressed understanding of the importance of proper follow up care.   This document serves as a record of services personally performed by [name], MD, PhD. It was created on their behalf by San Jetty. Owens Shark, OA an ophthalmic technician. The creation of this record is the provider's dictation and/or activities during the visit.    Electronically signed by: San Jetty. [place], [place] 01.27.2023 2:11 PM   [name], M.D., Ph.D. Diseases & Surgery of the Retina and Vitreous [facility]  I have reviewed the above documentation for accuracy and completeness, and I agree with the above. [name], M.D., Ph.D. 01/27/21 2:16 PM   Abbreviations: M myopia (nearsighted); A astigmatism; H hyperopia (farsighted); P presbyopia; Mrx spectacle prescription;  CTL contact lenses; OD right eye; OS left eye; OU both eyes  XT exotropia; ET esotropia; PEK punctate epithelial keratitis; PEE punctate epithelial erosions; DES dry eye syndrome; MGD meibomian gland dysfunction; ATs artificial tears; PFAT's preservative free artificial tears; [place] nuclear sclerotic cataract; PSC posterior subcapsular cataract; ERM epi-retinal membrane; PVD posterior vitreous detachment; RD retinal detachment; DM diabetes mellitus; DR diabetic retinopathy; NPDR non-proliferative diabetic retinopathy; PDR proliferative diabetic retinopathy; CSME clinically significant macular edema; DME diabetic macular edema; dbh dot blot hemorrhages; CWS cotton wool spot; POAG primary open angle glaucoma; C/D cup-to-disc ratio; HVF humphrey visual field; GVF goldmann visual field; OCT optical coherence tomography; IOP intraocular pressure; BRVO Branch retinal vein occlusion; CRVO central retinal vein occlusion; CRAO central retinal artery occlusion; BRAO branch retinal artery occlusion; RT retinal tear; SB scleral buckle; PPV pars plana vitrectomy; VH Vitreous hemorrhage; PRP panretinal laser photocoagulation; IVK intravitreal kenalog; VMT vitreomacular traction; MH Macular hole;  NVD neovascularization of the disc; NVE neovascularization elsewhere; AREDS age related eye disease study; ARMD age related macular degeneration; POAG primary open angle glaucoma; EBMD epithelial/anterior basement membrane dystrophy; ACIOL anterior chamber intraocular lens; IOL intraocular lens; PCIOL posterior chamber intraocular lens; Phaco/IOL phacoemulsification with intraocular lens placement; [place] photorefractive keratectomy; LASIK laser  assisted in situ keratomileusis; HTN hypertension; DM diabetes mellitus; COPD chronic obstructive pulmonary disease

## 2021-01-30 NOTE — Progress Notes (Incomplete)
[facility] Note  01/31/2021     CHIEF COMPLAINT Patient presents for No chief complaint on file.  HISTORY OF PRESENT ILLNESS: [name] [name] is a 66 y.o. male who presents to the clinic today for:    Referring physician: Janith Lima, MD [place],  [place] [id number]  HISTORICAL INFORMATION:   Selected notes from the MEDICAL RECORD NUMBER Referred by Dr. [name] for concern of PDR OU   CURRENT MEDICATIONS: No current outpatient medications on file. (Ophthalmic Drugs)   No current facility-administered medications for this visit. (Ophthalmic Drugs)   Current Outpatient Medications (Other)  Medication Sig   apixaban (ELIQUIS) 5 MG TABS tablet Take 1 tablet (5 mg total) by mouth 2 (two) times daily.   aspirin EC 325 MG tablet Take 325 mg by mouth daily.   atorvastatin (LIPITOR) 40 MG tablet Take 1 tablet (40 mg total) by mouth daily.   B Complex Vitamins (B COMPLEX-B12 PO) Take 1 tablet by mouth daily.    BD PEN NEEDLE NANO 2ND GEN 32G X 4 MM MISC USE FOR INSULIN   Blood Glucose Monitoring Suppl (ONETOUCH VERIO FLEX SYSTEM) w/Device KIT    cephALEXin (KEFLEX) 500 MG capsule    Cholecalciferol (VITAMIN D) 125 MCG (5000 UT) CAPS Take 5,000 Units by mouth daily.    ciprofloxacin (CIPRO) 500 MG tablet Take 500 mg by mouth 2 (two) times daily.   Continuous Blood Gluc Sensor (DEXCOM G6 SENSOR) MISC 1 Device by Does not apply route See admin instructions. Change every 10 days   Continuous Blood Gluc Transmit (DEXCOM G6 TRANSMITTER) MISC Use as instructed to check blood sugar   cyclobenzaprine (FLEXERIL) 5 MG tablet    HYDROcodone-acetaminophen (NORCO/VICODIN) 5-325 MG tablet    hydrOXYzine (ATARAX/VISTARIL) 10 MG tablet    Insulin Aspart FlexPen (NOVOLOG) 100 UNIT/ML Inject 4 Units into the skin 2 (two) times daily with a meal. And pen needles 3/day   Insulin Disposable Pump (OMNIPOD 5 G6 INTRO, GEN 5,) KIT 1 Device by Does  not apply route every 3 (three) days.   insulin glargine (LANTUS SOLOSTAR) 100 UNIT/ML Solostar Pen Inject 30 Units into the skin every morning.   Lancets (ONETOUCH DELICA PLUS FOYDXA12I) MISC Apply 1 each topically 4 (four) times daily.   lidocaine (LIDODERM) 5 % Place 1 patch onto the skin daily. Remove & Discard patch within 12 hours or as directed by MD   lisinopril (ZESTRIL) 5 MG tablet TAKE 1 TABLET BY MOUTH DAILY   mirtazapine (REMERON) 15 MG tablet Take 1 tablet (15 mg total) by mouth at bedtime.   ONETOUCH VERIO test strip 1 each 4 (four) times daily.   PARoxetine (PAXIL) 20 MG tablet Take 1 tablet (20 mg total) by mouth daily.   SANTYL ointment Apply 1 application topically daily.   traZODone (DESYREL) 50 MG tablet Take 1 tablet (50 mg total) by mouth at bedtime.   No current facility-administered medications for this visit. (Other)   REVIEW OF SYSTEMS:   ALLERGIES No Known Allergies  PAST MEDICAL HISTORY Past Medical History:  Diagnosis Date   Anxiety    Arthritis    Cataract    Constipation    pt states goes EOD- hard stools    Coronary artery disease    Depression    Diabetes mellitus without complication (HCC)    Diabetic retinopathy ([place])    DKA (diabetic ketoacidoses)    Hyperlipidemia    Hypertension  off meds   Hypertensive retinopathy    Psoriasis    Past Surgical History:  Procedure Laterality Date   ABDOMINAL AORTOGRAM W/LOWER EXTREMITY N/A 08/30/2020   Procedure: ABDOMINAL AORTOGRAM W/LOWER EXTREMITY;  Surgeon: Serafina Mitchell, MD;  Location: [facility];  Service: Cardiovascular;  Laterality: N/A;   ABDOMINAL AORTOGRAM W/LOWER EXTREMITY N/A 11/15/2020   Procedure: ABDOMINAL AORTOGRAM W/LOWER EXTREMITY;  Surgeon: Serafina Mitchell, MD;  Location: [facility];  Service: Cardiovascular;  Laterality: N/A;   ANKLE SURGERY     right   CARDIAC CATHETERIZATION  09/25/2001   COLONOSCOPY     ~10 yr ago- normal  per pt    CORONARY ARTERY  BYPASS GRAFT  09/30/2001   CABG x 4   IR THORACENTESIS ASP PLEURAL SPACE W/IMG GUIDE  05/19/2020   LAPAROSCOPIC INGUINAL HERNIA REPAIR Bilateral 02/09/2010   MASS EXCISION Left 03/12/2013   Procedure: LEFT INDEX EXCISION MASS AND DEBRIDMENT DISTAL INTERPHALANGEAL JOINT;  Surgeon: Tennis Must, MD;  Location: [facility];  Service: Orthopedics;  Laterality: Left;   PERIPHERAL VASCULAR BALLOON ANGIOPLASTY Left 08/30/2020   Procedure: PERIPHERAL VASCULAR BALLOON ANGIOPLASTY;  Surgeon: Serafina Mitchell, MD;  Location: [facility];  Service: Cardiovascular;  Laterality: Left;  Peroneal   FAMILY HISTORY Family History  Problem Relation Age of Onset   Lymphoma Father    Cancer Paternal Grandmother        Colon Cancer   Colon cancer Paternal Grandmother    Colon polyps Neg Hx    Esophageal cancer Neg Hx    Rectal cancer Neg Hx    Stomach cancer Neg Hx    Diabetes Mellitus I Neg Hx    SOCIAL HISTORY Social History   Tobacco Use   Smoking status: Some Days    Types: Cigars   Smokeless tobacco: Never  Vaping Use   Vaping Use: Never used  Substance Use Topics   Alcohol use: Yes    Comment: daily beer or scotch    Drug use: No       OPHTHALMIC EXAM: Not recorded    IMAGING AND PROCEDURES  Imaging and Procedures for 01/31/2021           ASSESSMENT/PLAN:  No diagnosis found.  1. Severe proliferative diabetic retinopathy, both eyes  - s/p PRP OS (11.10.22)  - s/p PRP OD (12.02.22)             - s/p IVA OD #1 (1.27.23)  - s/p IVA OS #1 (01.18.23) for VH - A1c >15% on 10.11.22 - pt reports improved BG control with changes in insulin regimen, insulin pump being shipped - exam shows extensive neovascularization, fibrosis and hemorrhage OU  - OD with worse NVD - OS w/ mild improvement in [facility] --- clearing superiorly and settling inferiorly - BCVA 20/100 OD - decreased from 20/50, CF 2' OS - FA (11.09.22) shows areas of vascular non-perfusion OU, +NVD OU and  +NVE OU - OCT shows OD: Partial PVD, interval increase in IRF greatest temporal macula nd fovea, interval worsening of NV on en face images; OS: Mild interval improvement in diffuse vitreous opacities, Retina grossly attached on scattered peripheral sweeps - discussed findings, severity of his disease, prognosis and likely need for extensive treatments -- PRP OU, multiple rounds of anti-VEGF therapy OU and possible surgery - pt wishes to proceed with injection - RBA of procedure discussed, questions answered - informed consent obtained and signed, 01.18.23 - see procedure note -  F/u week of January 30 DFE, OCT recheck OU, possible PRP fill in OD  2. Vitreous hemorrhage OS  - acute increase in VH OS (late Dec 2022) per pt report  - s/p IVA OS #1, 01.18.23 as above  - VH improving - VH precautions reviewed -- minimize activities, keep head elevated, avoid ASA/NSAIDs/blood thinners as able - f/u 1 week, DFE, OCT  3,4. Hypertensive retinopathy OU - discussed importance of tight BP control - monitor   5. Mixed Cataract OU - The symptoms of cataract, surgical options, and treatments and risks were discussed with patient - discussed diagnosis and progression - not yet visually significant - monitor for now   Ophthalmic Meds Ordered this visit:  No orders of the defined types were placed in this encounter.    No follow-ups on file.  There are no Patient Instructions on file for this visit.  Explained the diagnoses, plan, and follow up with the patient and they expressed understanding.  Patient expressed understanding of the importance of proper follow up care.   This document serves as a record of services personally performed by [name], MD, PhD. It was created on their behalf by Estill Bakes, COT an ophthalmic technician. The creation of this record is the provider's dictation and/or activities during the visit.    Electronically signed by: Estill Bakes, COT 1.30.23 @ 10:47  AM    [name], M.D., Ph.D. Diseases & Surgery of the Retina and Vitreous [facility]: M myopia (nearsighted); A astigmatism; H hyperopia (farsighted); P presbyopia; Mrx spectacle prescription;  CTL contact lenses; OD right eye; OS left eye; OU both eyes  XT exotropia; ET esotropia; PEK punctate epithelial keratitis; PEE punctate epithelial erosions; DES dry eye syndrome; MGD meibomian gland dysfunction; ATs artificial tears; PFAT's preservative free artificial tears; [place] nuclear sclerotic cataract; PSC posterior subcapsular cataract; ERM epi-retinal membrane; PVD posterior vitreous detachment; RD retinal detachment; DM diabetes mellitus; DR diabetic retinopathy; NPDR non-proliferative diabetic retinopathy; PDR proliferative diabetic retinopathy; CSME clinically significant macular edema; DME diabetic macular edema; dbh dot blot hemorrhages; CWS cotton wool spot; POAG primary open angle glaucoma; C/D cup-to-disc ratio; HVF humphrey visual field; GVF goldmann visual field; OCT optical coherence tomography; IOP intraocular pressure; BRVO Branch retinal vein occlusion; CRVO central retinal vein occlusion; CRAO central retinal artery occlusion; BRAO branch retinal artery occlusion; RT retinal tear; SB scleral buckle; PPV pars plana vitrectomy; VH Vitreous hemorrhage; PRP panretinal laser photocoagulation; IVK intravitreal kenalog; VMT vitreomacular traction; MH Macular hole;  NVD neovascularization of the disc; NVE neovascularization elsewhere; AREDS age related eye disease study; ARMD age related macular degeneration; POAG primary open angle glaucoma; EBMD epithelial/anterior basement membrane dystrophy; ACIOL anterior chamber intraocular lens; IOL intraocular lens; PCIOL posterior chamber intraocular lens; Phaco/IOL phacoemulsification with intraocular lens placement; [place] photorefractive keratectomy; LASIK laser assisted in situ keratomileusis; HTN  hypertension; DM diabetes mellitus; COPD chronic obstructive pulmonary disease

## 2021-01-31 ENCOUNTER — Telehealth: Payer: Self-pay | Admitting: Nutrition

## 2021-01-31 ENCOUNTER — Other Ambulatory Visit: Payer: Self-pay

## 2021-01-31 ENCOUNTER — Ambulatory Visit (INDEPENDENT_AMBULATORY_CARE_PROVIDER_SITE_OTHER): Payer: HMO | Admitting: Ophthalmology

## 2021-01-31 ENCOUNTER — Encounter (INDEPENDENT_AMBULATORY_CARE_PROVIDER_SITE_OTHER): Payer: Self-pay | Admitting: Ophthalmology

## 2021-01-31 DIAGNOSIS — E113513 Type 2 diabetes mellitus with proliferative diabetic retinopathy with macular edema, bilateral: Secondary | ICD-10-CM

## 2021-01-31 DIAGNOSIS — I10 Essential (primary) hypertension: Secondary | ICD-10-CM | POA: Diagnosis not present

## 2021-01-31 DIAGNOSIS — E1165 Type 2 diabetes mellitus with hyperglycemia: Secondary | ICD-10-CM

## 2021-01-31 DIAGNOSIS — H4312 Vitreous hemorrhage, left eye: Secondary | ICD-10-CM

## 2021-01-31 DIAGNOSIS — H35033 Hypertensive retinopathy, bilateral: Secondary | ICD-10-CM | POA: Diagnosis not present

## 2021-01-31 DIAGNOSIS — H25813 Combined forms of age-related cataract, bilateral: Secondary | ICD-10-CM | POA: Diagnosis not present

## 2021-01-31 MED ORDER — OMNIPOD 5 DEXG7G6 PODS GEN 5 MISC: 1.0000 "application " | 3 refills | Status: DC

## 2021-01-31 NOTE — Progress Notes (Signed)
[facility] Note  01/31/2021     CHIEF COMPLAINT Patient presents for Retina Follow Up  HISTORY OF PRESENT ILLNESS: [name] [name] is a 66 y.o. male who presents to the clinic today for:   HPI     Retina Follow Up   Patient presents with  Diabetic Retinopathy.  In both eyes.  This started weeks ago.  Severity is moderate.  Duration of 4 days.  Since onset it is stable.  I, the attending physician,  performed the HPI with the patient and updated documentation appropriately.        Comments   66 y/o male pt here for 4 day f/u for PDR w/DME OU.  Here for possible PRP fill-in OD today.  No change in [place] OU.  Denies pain, FOL, floaters.  No gtts.  BS 118 thi a.m.  A1C "high."      Last edited by [name], MD on 01/31/2021 11:46 AM.     Pt here for PRP  fill in OD today   Referring physician: [name], MD [place],  [place] [address]  HISTORICAL INFORMATION:   Selected notes from the MEDICAL RECORD NUMBER Referred by Dr. [name] for concern of PDR OU   CURRENT MEDICATIONS: No current outpatient medications on file. (Ophthalmic Drugs)   No current facility-administered medications for this visit. (Ophthalmic Drugs)   Current Outpatient Medications (Other)  Medication Sig   apixaban (ELIQUIS) 5 MG TABS tablet Take 1 tablet (5 mg total) by mouth 2 (two) times daily.   aspirin EC 325 MG tablet Take 325 mg by mouth daily.   atorvastatin (LIPITOR) 40 MG tablet Take 1 tablet (40 mg total) by mouth daily.   B Complex Vitamins (B COMPLEX-B12 PO) Take 1 tablet by mouth daily.    BD PEN NEEDLE NANO 2ND GEN 32G X 4 MM MISC USE FOR INSULIN   Blood Glucose Monitoring Suppl (ONETOUCH VERIO FLEX SYSTEM) w/Device KIT    cephALEXin (KEFLEX) 500 MG capsule    Cholecalciferol (VITAMIN D) 125 MCG (5000 UT) CAPS Take 5,000 Units by mouth daily.    ciprofloxacin (CIPRO) 500 MG tablet Take 500 mg by mouth 2 (two) times  daily.   Continuous Blood Gluc Sensor (DEXCOM G6 SENSOR) MISC 1 Device by Does not apply route See admin instructions. Change every 10 days   Continuous Blood Gluc Transmit (DEXCOM G6 TRANSMITTER) MISC Use as instructed to check blood sugar   cyclobenzaprine (FLEXERIL) 5 MG tablet    HYDROcodone-acetaminophen (NORCO/VICODIN) 5-325 MG tablet    hydrOXYzine (ATARAX/VISTARIL) 10 MG tablet    Insulin Aspart FlexPen (NOVOLOG) 100 UNIT/ML Inject 4 Units into the skin 2 (two) times daily with a meal. And pen needles 3/day   Insulin Disposable Pump (OMNIPOD 5 G6 INTRO, GEN 5,) KIT 1 Device by Does not apply route every 3 (three) days.   insulin glargine (LANTUS SOLOSTAR) 100 UNIT/ML Solostar Pen Inject 30 Units into the skin every morning.   Lancets (ONETOUCH DELICA PLUS [id number]) MISC Apply 1 each topically 4 (four) times daily.   lidocaine (LIDODERM) 5 % Place 1 patch onto the skin daily. Remove & Discard patch within 12 hours or as directed by MD   lisinopril (ZESTRIL) 5 MG tablet TAKE 1 TABLET BY MOUTH DAILY   mirtazapine (REMERON) 15 MG tablet Take 1 tablet (15 mg total) by mouth at bedtime.   ONETOUCH VERIO test strip 1 each 4 (four) times daily.  PARoxetine (PAXIL) 20 MG tablet Take 1 tablet (20 mg total) by mouth daily.   SANTYL ointment Apply 1 application topically daily.   traZODone (DESYREL) 50 MG tablet Take 1 tablet (50 mg total) by mouth at bedtime.   No current facility-administered medications for this visit. (Other)   REVIEW OF SYSTEMS: ROS   Positive for: Neurological, Skin, Musculoskeletal, Endocrine, Cardiovascular, Eyes Negative for: Constitutional, Gastrointestinal, Genitourinary, HENT, Respiratory, Psychiatric, Allergic/Imm, Heme/Lymph Last edited by Matthew Folks, COA on 01/31/2021  9:50 AM.     ALLERGIES No Known Allergies  PAST MEDICAL HISTORY Past Medical History:  Diagnosis Date   Anxiety    Arthritis    Cataract    Constipation    pt states goes EOD-  hard stools    Coronary artery disease    Depression    Diabetes mellitus without complication (HCC)    Diabetic retinopathy ([place])    DKA (diabetic ketoacidoses)    Hyperlipidemia    Hypertension    off meds   Hypertensive retinopathy    Psoriasis    Past Surgical History:  Procedure Laterality Date   ABDOMINAL AORTOGRAM W/LOWER EXTREMITY N/A 08/30/2020   Procedure: ABDOMINAL AORTOGRAM W/LOWER EXTREMITY;  Surgeon: Serafina Mitchell, MD;  Location: [facility];  Service: Cardiovascular;  Laterality: N/A;   ABDOMINAL AORTOGRAM W/LOWER EXTREMITY N/A 11/15/2020   Procedure: ABDOMINAL AORTOGRAM W/LOWER EXTREMITY;  Surgeon: Serafina Mitchell, MD;  Location: [facility];  Service: Cardiovascular;  Laterality: N/A;   ANKLE SURGERY     right   CARDIAC CATHETERIZATION  09/25/2001   COLONOSCOPY     ~10 yr ago- normal  per pt    CORONARY ARTERY BYPASS GRAFT  09/30/2001   CABG x 4   IR THORACENTESIS ASP PLEURAL SPACE W/IMG GUIDE  05/19/2020   LAPAROSCOPIC INGUINAL HERNIA REPAIR Bilateral 02/09/2010   MASS EXCISION Left 03/12/2013   Procedure: LEFT INDEX EXCISION MASS AND DEBRIDMENT DISTAL INTERPHALANGEAL JOINT;  Surgeon: Tennis Must, MD;  Location: [facility];  Service: Orthopedics;  Laterality: Left;   PERIPHERAL VASCULAR BALLOON ANGIOPLASTY Left 08/30/2020   Procedure: PERIPHERAL VASCULAR BALLOON ANGIOPLASTY;  Surgeon: Serafina Mitchell, MD;  Location: [facility];  Service: Cardiovascular;  Laterality: Left;  Peroneal   FAMILY HISTORY Family History  Problem Relation Age of Onset   Lymphoma Father    Cancer Paternal Grandmother        Colon Cancer   Colon cancer Paternal Grandmother    Colon polyps Neg Hx    Esophageal cancer Neg Hx    Rectal cancer Neg Hx    Stomach cancer Neg Hx    Diabetes Mellitus I Neg Hx    SOCIAL HISTORY Social History   Tobacco Use   Smoking status: Some Days    Types: Cigars   Smokeless tobacco: Never  Vaping Use   Vaping  Use: Never used  Substance Use Topics   Alcohol use: Yes    Comment: daily beer or scotch    Drug use: No       OPHTHALMIC EXAM: Base Eye Exam     Visual Acuity (Snellen - Linear)       Right Left   Dist [place] 20/80 -2 CF @ 2'   Dist ph [place] 20/30 -2 NI         Tonometry (Tonopen, 9:53 AM)       Right Left   Pressure 7 8  Pupils       Dark Light Shape React APD   Right 3 2 Round Brisk None   Left 3 2 Round Brisk None         Visual Fields (Counting fingers)       Left Right    Full Full         Extraocular Movement       Right Left    Full, Ortho Full, Ortho         Neuro/Psych     Oriented x3: Yes   Mood/Affect: Normal         Dilation     Right eye: 1.0% Mydriacyl, 2.5% Phenylephrine @ 9:53 AM           Slit Lamp and Fundus Exam     External Exam       Right Left   External Normal Normal         Slit Lamp Exam       Right Left   Lids/Lashes Dermatochalasis - upper lid, Meibomian gland dysfunction Dermatochalasis - upper lid, Meibomian gland dysfunction   Conjunctiva/Sclera White and quiet White and quiet   Cornea arcus, 2-3+ Punctate epithelial erosions arcus, 2+ Punctate epithelial erosions   Anterior Chamber Deep and quiet Deep and quiet   Iris Round and dilated, No NVI Round and dilated, No NVI   Lens 2-3+ Nuclear sclerosis with brunescence, 2+ Cortical cataract 2-3+ Nuclear sclerosis with brunescence, 2-3+ Cortical cataract   Anterior Vitreous Vitreous syneresis, blood stained vitreous condensations persistent Vitreous syneresis, diffuse VH - improved, concentrated centrally         Fundus Exam       Right Left   Disc Pink and Sharp, +NVD - thickening obscured by heme   C/D Ratio 0.2 0.2   Macula Flat, Blunted foveal reflex, scatterd MA, +cystic changes/edema temporal fovea and macula -- increased partially obscured by heme, grossly flat   Vessels attenuated, Tortuous, +NVE just inside ST arcades attenuated,  Tortuous, +NVD; NVE along ST arcades   Periphery Attached; 360 DBH, 360 PRP with room for fill in Inferior hemisphere obscured by heme, superior retina attached with scattered DBH and +PRP changes           IMAGING AND PROCEDURES  Imaging and Procedures for 01/31/2021  OCT, Retina - OU - Both Eyes       Right Eye Quality was good. Central Foveal Thickness: 286. Progression has improved. Findings include abnormal foveal contour, no SRF, intraretinal fluid, preretinal fibrosis, intraretinal hyper-reflective material (Partial PVD, interval improvement in IRF greatest temporal macula and fovea, interval improvement in NV on en face images).   Left Eye Findings include (No interpretable images obtained today).   Notes *Images captured and stored on drive  Diagnosis / Impression:  PDR with DME OU OD: Partial PVD, Partial PVD, interval improvement in IRF greatest temporal macula and fovea, interval improvement in NV on en face images OS: no images obtained today  Clinical management:  See below  Abbreviations: NFP - Normal foveal profile. CME - cystoid macular edema. PED - pigment epithelial detachment. IRF - intraretinal fluid. SRF - subretinal fluid. EZ - ellipsoid zone. ERM - epiretinal membrane. ORA - outer retinal atrophy. ORT - outer retinal tubulation. SRHM - subretinal hyper-reflective material. IRHM - intraretinal hyper-reflective material      Panretinal Photocoagulation - OD - Right Eye       LASER PROCEDURE NOTE  Diagnosis:   Proliferative Diabetic Retinopathy, RIGHT EYE  Procedure:  Pan-retinal photocoagulation using slit lamp laser, RIGHT EYE, fill in  Anesthesia:  Topical  Surgeon: [name], MD, PhD   Informed consent obtained, operative eye marked, and time out performed prior to initiation of laser.   Lumenis [id number] slit lamp laser Pattern: 2x2, 3x3 square Power: 400 mW Duration: 40 msec  Spot size: 200 microns  # spots: 970  spots  Complications: None.  Notes: vitreous heme obscuring view and preventing laser up take inferiorly and scattered focal areas  RTC: 2 wks -- DFE/OCT, possible injection  Patient tolerated the procedure well and received written and verbal post-procedure care information/education.            ASSESSMENT/PLAN:    ICD-10-CM   1. Proliferative diabetic retinopathy of both eyes with macular edema associated with type 2 diabetes mellitus (HCC)  E11.3513 OCT, Retina - OU - Both Eyes    Panretinal Photocoagulation - OD - Right Eye    2. Vitreous hemorrhage of left eye (HCC)  H43.12     3. Essential hypertension  I10     4. Hypertensive retinopathy of both eyes  H35.033     5. Combined forms of age-related cataract of both eyes  H25.813       1. Severe proliferative diabetic retinopathy, both eyes  - s/p PRP OS (11.10.22)  - s/p PRP OD (12.02.22)             - s/p IVA OD #1 (1.27.23)  - s/p IVA OS #1 (01.18.23) for VH - A1c 7.1% on 1.12.23; >15% on 10.11.22 - pt reports improved BG control with changes in insulin regimen, insulin pump being shipped - exam shows extensive neovascularization, fibrosis and hemorrhage OU  - OD with regressing NVD s/p IVA OD #1 - OS w/ mild improvement in [facility] --- clearing superiorly and settling inferiorly - BCVA 20/30 OD - improved from 20/100, CF 2' OS - FA (11.09.22) shows areas of vascular non-perfusion OU, +NVD OU and +NVE OU - OCT shows OD: Partial PVD, interval improvement in IRF greatest temporal macula and fovea, interval improvement in NV on en face images; OS: no interpretable images obtained today - discussed findings, severity of his disease, prognosis and likely need for extensive treatments -- PRP OU, multiple rounds of anti-VEGF therapy OU and possible surgery - recommend PRP fill in OD today, 01.31.23 - pt wishes to proceed with laser - RBA of procedure discussed, questions answered - informed consent obtained and signed,  01.18.23 - see procedure note - F/u February 15, DFE, OCT, possible injection(s)  2. Vitreous hemorrhage OS  - acute increase in [facility] OS (late Dec 2022) per pt report  - s/p IVA OS #1, 01.18.23 as above  - VH improving - VH precautions reviewed -- minimize activities, keep head elevated, avoid ASA/NSAIDs/blood thinners as able - f/u February 15 DFE, OCT  3,4. Hypertensive retinopathy OU - discussed importance of tight BP control - monitor   5. Mixed Cataract OU - The symptoms of cataract, surgical options, and treatments and risks were discussed with patient - discussed diagnosis and progression - not yet visually significant - monitor for now   Ophthalmic Meds Ordered this visit:  No orders of the defined types were placed in this encounter.    Return for f/u February 15, PDR OU, DFE, OCT.  There are no Patient Instructions on file for this visit.  Explained the diagnoses, plan, and follow up with the patient and they expressed understanding.  Patient expressed  understanding of the importance of proper follow up care.   This document serves as a record of services personally performed by [name], MD, PhD. It was created on their behalf by Estill Bakes, COT an ophthalmic technician. The creation of this record is the provider's dictation and/or activities during the visit.    Electronically signed by: Estill Bakes, COT 1.30.23 @ 11:54 AM    [name], M.D., Ph.D. Diseases & Surgery of the Retina and Vitreous [facility]  I have reviewed the above documentation for accuracy and completeness, and I agree with the above. [name], M.D., Ph.D. 01/31/21 11:56 AM   Abbreviations: M myopia (nearsighted); A astigmatism; H hyperopia (farsighted); P presbyopia; Mrx spectacle prescription;  CTL contact lenses; OD right eye; OS left eye; OU both eyes  XT exotropia; ET esotropia; PEK punctate epithelial keratitis; PEE punctate epithelial  erosions; DES dry eye syndrome; MGD meibomian gland dysfunction; ATs artificial tears; PFAT's preservative free artificial tears; [place] nuclear sclerotic cataract; PSC posterior subcapsular cataract; ERM epi-retinal membrane; PVD posterior vitreous detachment; RD retinal detachment; DM diabetes mellitus; DR diabetic retinopathy; NPDR non-proliferative diabetic retinopathy; PDR proliferative diabetic retinopathy; CSME clinically significant macular edema; DME diabetic macular edema; dbh dot blot hemorrhages; CWS cotton wool spot; POAG primary open angle glaucoma; C/D cup-to-disc ratio; HVF humphrey visual field; GVF goldmann visual field; OCT optical coherence tomography; IOP intraocular pressure; BRVO Branch retinal vein occlusion; CRVO central retinal vein occlusion; CRAO central retinal artery occlusion; BRAO branch retinal artery occlusion; RT retinal tear; SB scleral buckle; PPV pars plana vitrectomy; VH Vitreous hemorrhage; PRP panretinal laser photocoagulation; IVK intravitreal kenalog; VMT vitreomacular traction; MH Macular hole;  NVD neovascularization of the disc; NVE neovascularization elsewhere; AREDS age related eye disease study; ARMD age related macular degeneration; POAG primary open angle glaucoma; EBMD epithelial/anterior basement membrane dystrophy; ACIOL anterior chamber intraocular lens; IOL intraocular lens; PCIOL posterior chamber intraocular lens; Phaco/IOL phacoemulsification with intraocular lens placement; [place] photorefractive keratectomy; LASIK laser assisted in situ keratomileusis; HTN hypertension; DM diabetes mellitus; COPD chronic obstructive pulmonary disease

## 2021-01-31 NOTE — Telephone Encounter (Signed)
Rx sent to [facility] to [facility] on [address]

## 2021-01-31 NOTE — Telephone Encounter (Signed)
Mother says the pharmacy that the pods come from will not longer take her insurance.  Please send a script for the Om[facility]POd 5 pods to [facility] at [address]  HE is using 1 pod every 3 days.

## 2021-02-03 DIAGNOSIS — F419 Anxiety disorder, unspecified: Secondary | ICD-10-CM | POA: Diagnosis not present

## 2021-02-03 DIAGNOSIS — L97423 Non-pressure chronic ulcer of left heel and midfoot with necrosis of muscle: Secondary | ICD-10-CM | POA: Diagnosis not present

## 2021-02-03 DIAGNOSIS — I480 Paroxysmal atrial fibrillation: Secondary | ICD-10-CM | POA: Diagnosis not present

## 2021-02-03 DIAGNOSIS — L97511 Non-pressure chronic ulcer of other part of right foot limited to breakdown of skin: Secondary | ICD-10-CM | POA: Diagnosis not present

## 2021-02-03 DIAGNOSIS — Z7902 Long term (current) use of antithrombotics/antiplatelets: Secondary | ICD-10-CM | POA: Diagnosis not present

## 2021-02-03 DIAGNOSIS — F3341 Major depressive disorder, recurrent, in partial remission: Secondary | ICD-10-CM | POA: Diagnosis not present

## 2021-02-03 DIAGNOSIS — E1151 Type 2 diabetes mellitus with diabetic peripheral angiopathy without gangrene: Secondary | ICD-10-CM | POA: Diagnosis not present

## 2021-02-03 DIAGNOSIS — E1142 Type 2 diabetes mellitus with diabetic polyneuropathy: Secondary | ICD-10-CM | POA: Diagnosis not present

## 2021-02-03 DIAGNOSIS — D649 Anemia, unspecified: Secondary | ICD-10-CM | POA: Diagnosis not present

## 2021-02-03 DIAGNOSIS — M1990 Unspecified osteoarthritis, unspecified site: Secondary | ICD-10-CM | POA: Diagnosis not present

## 2021-02-03 DIAGNOSIS — N401 Enlarged prostate with lower urinary tract symptoms: Secondary | ICD-10-CM | POA: Diagnosis not present

## 2021-02-03 DIAGNOSIS — G8929 Other chronic pain: Secondary | ICD-10-CM | POA: Diagnosis not present

## 2021-02-03 DIAGNOSIS — M720 Palmar fascial fibromatosis [Dupuytren]: Secondary | ICD-10-CM | POA: Diagnosis not present

## 2021-02-03 DIAGNOSIS — N138 Other obstructive and reflux uropathy: Secondary | ICD-10-CM | POA: Diagnosis not present

## 2021-02-03 DIAGNOSIS — S42102D Fracture of unspecified part of scapula, left shoulder, subsequent encounter for fracture with routine healing: Secondary | ICD-10-CM | POA: Diagnosis not present

## 2021-02-03 DIAGNOSIS — M47812 Spondylosis without myelopathy or radiculopathy, cervical region: Secondary | ICD-10-CM | POA: Diagnosis not present

## 2021-02-03 DIAGNOSIS — K5904 Chronic idiopathic constipation: Secondary | ICD-10-CM | POA: Diagnosis not present

## 2021-02-03 DIAGNOSIS — E785 Hyperlipidemia, unspecified: Secondary | ICD-10-CM | POA: Diagnosis not present

## 2021-02-03 DIAGNOSIS — I10 Essential (primary) hypertension: Secondary | ICD-10-CM | POA: Diagnosis not present

## 2021-02-03 DIAGNOSIS — L409 Psoriasis, unspecified: Secondary | ICD-10-CM | POA: Diagnosis not present

## 2021-02-03 DIAGNOSIS — E11621 Type 2 diabetes mellitus with foot ulcer: Secondary | ICD-10-CM | POA: Diagnosis not present

## 2021-02-03 DIAGNOSIS — Z7982 Long term (current) use of aspirin: Secondary | ICD-10-CM | POA: Diagnosis not present

## 2021-02-03 DIAGNOSIS — I2510 Atherosclerotic heart disease of native coronary artery without angina pectoris: Secondary | ICD-10-CM | POA: Diagnosis not present

## 2021-02-03 DIAGNOSIS — Z7901 Long term (current) use of anticoagulants: Secondary | ICD-10-CM | POA: Diagnosis not present

## 2021-02-08 DIAGNOSIS — Z7901 Long term (current) use of anticoagulants: Secondary | ICD-10-CM | POA: Diagnosis not present

## 2021-02-08 DIAGNOSIS — E1151 Type 2 diabetes mellitus with diabetic peripheral angiopathy without gangrene: Secondary | ICD-10-CM | POA: Diagnosis not present

## 2021-02-08 DIAGNOSIS — L97512 Non-pressure chronic ulcer of other part of right foot with fat layer exposed: Secondary | ICD-10-CM | POA: Diagnosis not present

## 2021-02-08 DIAGNOSIS — E559 Vitamin D deficiency, unspecified: Secondary | ICD-10-CM | POA: Diagnosis not present

## 2021-02-08 DIAGNOSIS — M720 Palmar fascial fibromatosis [Dupuytren]: Secondary | ICD-10-CM | POA: Diagnosis not present

## 2021-02-08 DIAGNOSIS — E785 Hyperlipidemia, unspecified: Secondary | ICD-10-CM | POA: Diagnosis not present

## 2021-02-08 DIAGNOSIS — Z7982 Long term (current) use of aspirin: Secondary | ICD-10-CM | POA: Diagnosis not present

## 2021-02-08 DIAGNOSIS — F419 Anxiety disorder, unspecified: Secondary | ICD-10-CM | POA: Diagnosis not present

## 2021-02-08 DIAGNOSIS — N138 Other obstructive and reflux uropathy: Secondary | ICD-10-CM | POA: Diagnosis not present

## 2021-02-08 DIAGNOSIS — K5904 Chronic idiopathic constipation: Secondary | ICD-10-CM | POA: Diagnosis not present

## 2021-02-08 DIAGNOSIS — M1990 Unspecified osteoarthritis, unspecified site: Secondary | ICD-10-CM | POA: Diagnosis not present

## 2021-02-08 DIAGNOSIS — I25812 Atherosclerosis of bypass graft of coronary artery of transplanted heart without angina pectoris: Secondary | ICD-10-CM | POA: Diagnosis not present

## 2021-02-08 DIAGNOSIS — F17210 Nicotine dependence, cigarettes, uncomplicated: Secondary | ICD-10-CM | POA: Diagnosis not present

## 2021-02-08 DIAGNOSIS — F3341 Major depressive disorder, recurrent, in partial remission: Secondary | ICD-10-CM | POA: Diagnosis not present

## 2021-02-08 DIAGNOSIS — D649 Anemia, unspecified: Secondary | ICD-10-CM | POA: Diagnosis not present

## 2021-02-08 DIAGNOSIS — I480 Paroxysmal atrial fibrillation: Secondary | ICD-10-CM | POA: Diagnosis not present

## 2021-02-08 DIAGNOSIS — E1142 Type 2 diabetes mellitus with diabetic polyneuropathy: Secondary | ICD-10-CM | POA: Diagnosis not present

## 2021-02-08 DIAGNOSIS — L409 Psoriasis, unspecified: Secondary | ICD-10-CM | POA: Diagnosis not present

## 2021-02-08 DIAGNOSIS — E11621 Type 2 diabetes mellitus with foot ulcer: Secondary | ICD-10-CM | POA: Diagnosis not present

## 2021-02-08 DIAGNOSIS — E113299 Type 2 diabetes mellitus with mild nonproliferative diabetic retinopathy without macular edema, unspecified eye: Secondary | ICD-10-CM | POA: Diagnosis not present

## 2021-02-08 DIAGNOSIS — I10 Essential (primary) hypertension: Secondary | ICD-10-CM | POA: Diagnosis not present

## 2021-02-08 DIAGNOSIS — G8929 Other chronic pain: Secondary | ICD-10-CM | POA: Diagnosis not present

## 2021-02-08 DIAGNOSIS — N401 Enlarged prostate with lower urinary tract symptoms: Secondary | ICD-10-CM | POA: Diagnosis not present

## 2021-02-08 DIAGNOSIS — M47812 Spondylosis without myelopathy or radiculopathy, cervical region: Secondary | ICD-10-CM | POA: Diagnosis not present

## 2021-02-09 NOTE — Progress Notes (Shared)
[facility] Note  02/15/2021     CHIEF COMPLAINT Patient presents for No chief complaint on file.  HISTORY OF PRESENT ILLNESS: [name] [name] is a 66 y.o. male who presents to the clinic today for:     Referring physician: Janith Lima, MD [place],  [place] [id number]  HISTORICAL INFORMATION:   Selected notes from the MEDICAL RECORD NUMBER Referred by Dr. [name] for concern of PDR OU   CURRENT MEDICATIONS: No current outpatient medications on file. (Ophthalmic Drugs)   No current facility-administered medications for this visit. (Ophthalmic Drugs)   Current Outpatient Medications (Other)  Medication Sig   apixaban (ELIQUIS) 5 MG TABS tablet Take 1 tablet (5 mg total) by mouth 2 (two) times daily.   aspirin EC 325 MG tablet Take 325 mg by mouth daily.   atorvastatin (LIPITOR) 40 MG tablet Take 1 tablet (40 mg total) by mouth daily.   B Complex Vitamins (B COMPLEX-B12 PO) Take 1 tablet by mouth daily.    BD PEN NEEDLE NANO 2ND GEN 32G X 4 MM MISC USE FOR INSULIN   Blood Glucose Monitoring Suppl (ONETOUCH VERIO FLEX SYSTEM) w/Device KIT    cephALEXin (KEFLEX) 500 MG capsule    Cholecalciferol (VITAMIN D) 125 MCG (5000 UT) CAPS Take 5,000 Units by mouth daily.    ciprofloxacin (CIPRO) 500 MG tablet Take 500 mg by mouth 2 (two) times daily.   Continuous Blood Gluc Sensor (DEXCOM G6 SENSOR) MISC 1 Device by Does not apply route See admin instructions. Change every 10 days   Continuous Blood Gluc Transmit (DEXCOM G6 TRANSMITTER) MISC Use as instructed to check blood sugar   cyclobenzaprine (FLEXERIL) 5 MG tablet    HYDROcodone-acetaminophen (NORCO/VICODIN) 5-325 MG tablet    hydrOXYzine (ATARAX/VISTARIL) 10 MG tablet    Insulin Aspart FlexPen (NOVOLOG) 100 UNIT/ML Inject 4 Units into the skin 2 (two) times daily with a meal. And pen needles 3/day   Insulin Disposable Pump (OMNIPOD 5 G6 INTRO, GEN 5,) KIT 1 Device by Does  not apply route every 3 (three) days.   Insulin Disposable Pump (OMNIPOD 5 G6 POD, GEN 5,) MISC 1 application by Does not apply route every 3 (three) days.   insulin glargine (LANTUS SOLOSTAR) 100 UNIT/ML Solostar Pen Inject 30 Units into the skin every morning.   Lancets (ONETOUCH DELICA PLUS [id number]) MISC Apply 1 each topically 4 (four) times daily.   lidocaine (LIDODERM) 5 % Place 1 patch onto the skin daily. Remove & Discard patch within 12 hours or as directed by MD   lisinopril (ZESTRIL) 5 MG tablet TAKE 1 TABLET BY MOUTH DAILY   mirtazapine (REMERON) 15 MG tablet Take 1 tablet (15 mg total) by mouth at bedtime.   ONETOUCH VERIO test strip 1 each 4 (four) times daily.   PARoxetine (PAXIL) 20 MG tablet Take 1 tablet (20 mg total) by mouth daily.   SANTYL ointment Apply 1 application topically daily.   traZODone (DESYREL) 50 MG tablet Take 1 tablet (50 mg total) by mouth at bedtime.   No current facility-administered medications for this visit. (Other)   REVIEW OF SYSTEMS:   ALLERGIES No Known Allergies  PAST MEDICAL HISTORY Past Medical History:  Diagnosis Date   Anxiety    Arthritis    Cataract    Constipation    pt states goes EOD- hard stools    Coronary artery disease    Depression    Diabetes  mellitus without complication (HCC)    Diabetic retinopathy ([place])    DKA (diabetic ketoacidoses)    Hyperlipidemia    Hypertension    off meds   Hypertensive retinopathy    Psoriasis    Past Surgical History:  Procedure Laterality Date   ABDOMINAL AORTOGRAM W/LOWER EXTREMITY N/A 08/30/2020   Procedure: ABDOMINAL AORTOGRAM W/LOWER EXTREMITY;  Surgeon: Serafina Mitchell, MD;  Location: [facility];  Service: Cardiovascular;  Laterality: N/A;   ABDOMINAL AORTOGRAM W/LOWER EXTREMITY N/A 11/15/2020   Procedure: ABDOMINAL AORTOGRAM W/LOWER EXTREMITY;  Surgeon: Serafina Mitchell, MD;  Location: [facility];  Service: Cardiovascular;  Laterality: N/A;   ANKLE SURGERY      right   CARDIAC CATHETERIZATION  09/25/2001   COLONOSCOPY     ~10 yr ago- normal  per pt    CORONARY ARTERY BYPASS GRAFT  09/30/2001   CABG x 4   IR THORACENTESIS ASP PLEURAL SPACE W/IMG GUIDE  05/19/2020   LAPAROSCOPIC INGUINAL HERNIA REPAIR Bilateral 02/09/2010   MASS EXCISION Left 03/12/2013   Procedure: LEFT INDEX EXCISION MASS AND DEBRIDMENT DISTAL INTERPHALANGEAL JOINT;  Surgeon: Tennis Must, MD;  Location: [facility];  Service: Orthopedics;  Laterality: Left;   PERIPHERAL VASCULAR BALLOON ANGIOPLASTY Left 08/30/2020   Procedure: PERIPHERAL VASCULAR BALLOON ANGIOPLASTY;  Surgeon: Serafina Mitchell, MD;  Location: [facility];  Service: Cardiovascular;  Laterality: Left;  Peroneal   FAMILY HISTORY Family History  Problem Relation Age of Onset   Lymphoma Father    Cancer Paternal Grandmother        Colon Cancer   Colon cancer Paternal Grandmother    Colon polyps Neg Hx    Esophageal cancer Neg Hx    Rectal cancer Neg Hx    Stomach cancer Neg Hx    Diabetes Mellitus I Neg Hx    SOCIAL HISTORY Social History   Tobacco Use   Smoking status: Some Days    Types: Cigars   Smokeless tobacco: Never  Vaping Use   Vaping Use: Never used  Substance Use Topics   Alcohol use: Yes    Comment: daily beer or scotch    Drug use: No       OPHTHALMIC EXAM: Not recorded    IMAGING AND PROCEDURES  Imaging and Procedures for 02/15/2021          ASSESSMENT/PLAN:  No diagnosis found.   1. Severe proliferative diabetic retinopathy, both eyes  - s/p PRP OS (11.10.22)  - s/p PRP OD (12.02.22)             - s/p IVA OD #1 (1.27.23)  - s/p IVA OS #1 (01.18.23) for VH - A1c 7.1% on 1.12.23; >15% on 10.11.22 - pt reports improved BG control with changes in insulin regimen, insulin pump being shipped - exam shows extensive neovascularization, fibrosis and hemorrhage OU  - OD with regressing NVD s/p IVA OD #1 - OS w/ mild improvement in [facility] --- clearing  superiorly and settling inferiorly - BCVA 20/30 OD - improved from 20/100, CF 2' OS - FA (11.09.22) shows areas of vascular non-perfusion OU, +NVD OU and +NVE OU - OCT shows OD: Partial PVD, interval improvement in IRF greatest temporal macula and fovea, interval improvement in NV on en face images; OS: no interpretable images obtained today - discussed findings, severity of his disease, prognosis and likely need for extensive treatments -- PRP OU, multiple rounds of anti-VEGF therapy OU and possible surgery -  recommend PRP fill in OD today, 01.31.23 - pt wishes to proceed with laser - RBA of procedure discussed, questions answered - informed consent obtained and signed, 01.18.23 - see procedure note - F/u February 15, DFE, OCT, possible injection(s)  2. Vitreous hemorrhage OS  - acute increase in [facility] OS (late Dec 2022) per pt report  - s/p IVA OS #1, 01.18.23 as above  - VH improving - VH precautions reviewed -- minimize activities, keep head elevated, avoid ASA/NSAIDs/blood thinners as able - f/u February 15 DFE, OCT  3,4. Hypertensive retinopathy OU - discussed importance of tight BP control - monitor   5. Mixed Cataract OU - The symptoms of cataract, surgical options, and treatments and risks were discussed with patient - discussed diagnosis and progression - not yet visually significant - monitor for now   Ophthalmic Meds Ordered this visit:  No orders of the defined types were placed in this encounter.    No follow-ups on file.  There are no Patient Instructions on file for this visit.  Explained the diagnoses, plan, and follow up with the patient and they expressed understanding.  Patient expressed understanding of the importance of proper follow up care.   This document serves as a record of services personally performed by [name], MD, PhD. It was created on their behalf by Orvan Falconer, an ophthalmic technician. The creation of this record is the  provider's dictation and/or activities during the visit.    Electronically signed by: Orvan Falconer, OA, 02/09/21  3:22 PM    [name], M.D., Ph.D. Diseases & Surgery of the Retina and Vitreous [facility]  I have reviewed the above documentation for accuracy and completeness, and I agree with the above. [name], M.D., Ph.D. 01/31/21 3:22 PM   Abbreviations: M myopia (nearsighted); A astigmatism; H hyperopia (farsighted); P presbyopia; Mrx spectacle prescription;  CTL contact lenses; OD right eye; OS left eye; OU both eyes  XT exotropia; ET esotropia; PEK punctate epithelial keratitis; PEE punctate epithelial erosions; DES dry eye syndrome; MGD meibomian gland dysfunction; ATs artificial tears; PFAT's preservative free artificial tears; [place] nuclear sclerotic cataract; PSC posterior subcapsular cataract; ERM epi-retinal membrane; PVD posterior vitreous detachment; RD retinal detachment; DM diabetes mellitus; DR diabetic retinopathy; NPDR non-proliferative diabetic retinopathy; PDR proliferative diabetic retinopathy; CSME clinically significant macular edema; DME diabetic macular edema; dbh dot blot hemorrhages; CWS cotton wool spot; POAG primary open angle glaucoma; C/D cup-to-disc ratio; HVF humphrey visual field; GVF goldmann visual field; OCT optical coherence tomography; IOP intraocular pressure; BRVO Branch retinal vein occlusion; CRVO central retinal vein occlusion; CRAO central retinal artery occlusion; BRAO branch retinal artery occlusion; RT retinal tear; SB scleral buckle; PPV pars plana vitrectomy; VH Vitreous hemorrhage; PRP panretinal laser photocoagulation; IVK intravitreal kenalog; VMT vitreomacular traction; MH Macular hole;  NVD neovascularization of the disc; NVE neovascularization elsewhere; AREDS age related eye disease study; ARMD age related macular degeneration; POAG primary open angle glaucoma; EBMD epithelial/anterior basement membrane  dystrophy; ACIOL anterior chamber intraocular lens; IOL intraocular lens; PCIOL posterior chamber intraocular lens; Phaco/IOL phacoemulsification with intraocular lens placement; [place] photorefractive keratectomy; LASIK laser assisted in situ keratomileusis; HTN hypertension; DM diabetes mellitus; COPD chronic obstructive pulmonary disease

## 2021-02-13 ENCOUNTER — Ambulatory Visit (INDEPENDENT_AMBULATORY_CARE_PROVIDER_SITE_OTHER): Payer: HMO | Admitting: Physician Assistant

## 2021-02-13 ENCOUNTER — Encounter: Payer: Self-pay | Admitting: Physician Assistant

## 2021-02-13 ENCOUNTER — Other Ambulatory Visit: Payer: Self-pay

## 2021-02-13 VITALS — BP 153/81 | HR 67 | Temp 98.2°F | Ht 75.0 in | Wt 194.2 lb

## 2021-02-13 DIAGNOSIS — R600 Localized edema: Secondary | ICD-10-CM | POA: Diagnosis not present

## 2021-02-13 DIAGNOSIS — R21 Rash and other nonspecific skin eruption: Secondary | ICD-10-CM

## 2021-02-13 DIAGNOSIS — E1165 Type 2 diabetes mellitus with hyperglycemia: Secondary | ICD-10-CM | POA: Diagnosis not present

## 2021-02-13 DIAGNOSIS — I480 Paroxysmal atrial fibrillation: Secondary | ICD-10-CM

## 2021-02-13 DIAGNOSIS — R0609 Other forms of dyspnea: Secondary | ICD-10-CM

## 2021-02-13 MED ORDER — TRIAMCINOLONE ACETONIDE 0.1 % EX CREA: 1.0000 "application " | TOPICAL_CREAM | Freq: Two times a day (BID) | CUTANEOUS | 0 refills | Status: DC

## 2021-02-13 NOTE — Progress Notes (Signed)
Subjective:    Patient ID: [name] [name], male    DOB: [birth year], 66 y.o.   MRN: [id number]  Chief Complaint  Patient presents with   Rash    HPI 66 y.o. patient presents today for new patient establishment with me.  Patient was previously established with Dr. Ronnald Ramp.  Current Care Team: Dr. Loanne Drilling - endocrinology  Dr. Coralyn Pear - ophthalmology  Acute Concerns: -Rash on the back of his neck has been itching for a few weeks.   -Difficulty breathing - feels like he cannot get enough "air intake". Going on for two weeks now. No hx of lung issues. "Feels like I'm struggling to get air and it's worse at night." Does not have stairs at his apartment. Feeling out of breath with walking. Chest feels congested. Coughing up clear phlegm every 30 minutes or so.  No recent surgeries or long trips. Feels like he had a "flu-bug" a few weeks ago and had fatigue, fever, chills. COVID-19 test was negative. Smokes cigar occasionally. Cannot lay flat at night.  Chronic Concerns: See PMH listed below, as well as A/P for details on issues we specifically discussed during today's visit.      Past Medical History:  Diagnosis Date   Anxiety    Arthritis    Cataract    Constipation    pt states goes EOD- hard stools    Coronary artery disease    Depression    Diabetes mellitus without complication (HCC)    Diabetic retinopathy ([place])    DKA (diabetic ketoacidoses)    Hyperlipidemia    Hypertension    off meds   Hypertensive retinopathy    Psoriasis     Past Surgical History:  Procedure Laterality Date   ABDOMINAL AORTOGRAM W/LOWER EXTREMITY N/A 08/30/2020   Procedure: ABDOMINAL AORTOGRAM W/LOWER EXTREMITY;  Surgeon: Serafina Mitchell, MD;  Location: [facility];  Service: Cardiovascular;  Laterality: N/A;   ABDOMINAL AORTOGRAM W/LOWER EXTREMITY N/A 11/15/2020   Procedure: ABDOMINAL AORTOGRAM W/LOWER EXTREMITY;  Surgeon: Serafina Mitchell, MD;  Location: [facility];  Service:  Cardiovascular;  Laterality: N/A;   ANKLE SURGERY     right   CARDIAC CATHETERIZATION  09/25/2001   COLONOSCOPY     ~10 yr ago- normal  per pt    CORONARY ARTERY BYPASS GRAFT  09/30/2001   CABG x 4   IR THORACENTESIS ASP PLEURAL SPACE W/IMG GUIDE  05/19/2020   LAPAROSCOPIC INGUINAL HERNIA REPAIR Bilateral 02/09/2010   MASS EXCISION Left 03/12/2013   Procedure: LEFT INDEX EXCISION MASS AND DEBRIDMENT DISTAL INTERPHALANGEAL JOINT;  Surgeon: Tennis Must, MD;  Location: [facility];  Service: Orthopedics;  Laterality: Left;   PERIPHERAL VASCULAR BALLOON ANGIOPLASTY Left 08/30/2020   Procedure: PERIPHERAL VASCULAR BALLOON ANGIOPLASTY;  Surgeon: Serafina Mitchell, MD;  Location: [facility];  Service: Cardiovascular;  Laterality: Left;  Peroneal    Family History  Problem Relation Age of Onset   Lymphoma Father    Cancer Paternal Grandmother        Colon Cancer   Colon cancer Paternal Grandmother    Colon polyps Neg Hx    Esophageal cancer Neg Hx    Rectal cancer Neg Hx    Stomach cancer Neg Hx    Diabetes Mellitus I Neg Hx     Social History   Tobacco Use   Smoking status: Some Days    Types: Cigars   Smokeless tobacco: Never  Vaping Use  Vaping Use: Never used  Substance Use Topics   Alcohol use: Yes    Comment: daily beer or scotch    Drug use: No     No Known Allergies  Review of Systems NEGATIVE UNLESS OTHERWISE INDICATED IN HPI      Objective:     BP (!) 153/81    Pulse 67    Temp 98.2 F (36.8 C)    Ht 6\' 3"  (1.905 m)    Wt 194 lb 4 oz (88.1 kg)    SpO2 95%    BMI 24.28 kg/m   Wt Readings from Last 3 Encounters:  02/13/21 194 lb 4 oz (88.1 kg)  01/12/21 156 lb 3.2 oz (70.9 kg)  12/10/20 145 lb (65.8 kg)    BP Readings from Last 3 Encounters:  02/13/21 (!) 153/81  01/18/21 99/65  01/12/21 (!) 144/76     Physical Exam Vitals and nursing note reviewed.  Constitutional:      General: He is not in acute distress.    Appearance: He  is cachectic. He is not toxic-appearing.     Comments: Using a rolling walker  HENT:     Head: Normocephalic and atraumatic.     Right Ear: External ear normal.     Left Ear: External ear normal.     Nose: Nose normal.     Mouth/Throat:     Mouth: Mucous membranes are moist.     Pharynx: Oropharynx is clear.  Eyes:     Extraocular Movements: Extraocular movements intact.     Conjunctiva/sclera: Conjunctivae normal.     Pupils: Pupils are equal, round, and reactive to light.  Cardiovascular:     Rate and Rhythm: Normal rate and regular rhythm.     Pulses: Normal pulses.     Heart sounds: Normal heart sounds.  Pulmonary:     Effort: Pulmonary effort is normal.     Breath sounds: Examination of the left-lower field reveals decreased breath sounds. Decreased breath sounds present.  Musculoskeletal:        General: Normal range of motion.     Cervical back: Normal range of motion and neck supple.     Right lower leg: 2+ Pitting Edema present.     Left lower leg: 2+ Pitting Edema present.  Skin:    General: Skin is warm and dry.     Findings: Rash (signs of excoriation patch posterior neck) present.  Neurological:     General: No focal deficit present.     Mental Status: He is alert and oriented to person, place, and time.  Psychiatric:        Mood and Affect: Mood normal.        Behavior: Behavior normal.       Assessment & Plan:   Problem List Items Addressed This Visit       Cardiovascular and Mediastinum   Paroxysmal atrial fibrillation (HCC)     Endocrine   Uncontrolled type 2 diabetes mellitus with hyperglycemia ([place])   Other Visit Diagnoses     Dyspnea on exertion    -  Primary   Relevant Orders   EKG 12-Lead (Completed)   Lower leg edema       Relevant Orders   EKG 12-Lead (Completed)   Rash and nonspecific skin eruption            Meds ordered this encounter  Medications   triamcinolone cream (KENALOG) 0.1 %    Sig: Apply 1 application topically 2  (  two) times daily.    Dispense:  45 g    Refill:  0    Plan: -New pt establishment with me today. Hx positive for uncontrolled T2DM on insulin pump, CABG x4, HTN, paroxysmal A-fib. -Found to be in A-fib today on EKG and patient seemed unsure of his history about this, but says he has been taking his Eliquis. He does not have a cardiologist and tells me he was having a hard time keeping up with all of the different doctor's appointments. -With his 2-week progressing DOE, orthopnea, and questionable 38 lb weight gain in the last 4 weeks, I recommended patient go to [facility] ER today for stat labs and CXR at the very least. Pt tells me that he has an eye appointment later today and he would have to wait until tomorrow to go. -Pt aware of possible risks by delaying care.  -Will use triamcinolone cream as directed for rash on posterior neck, most likely a type of dermatitis.   In addition to time spent for ekg, I spent 50 minutes of total time on the date of the encounter performing the following actions: chart review prior to seeing the patient, obtaining history, performing a medically necessary exam, counseling on the treatment plan, placing orders, and documenting in our EHR.     Haden Cavenaugh M Kamen Hanken, PA-C

## 2021-02-13 NOTE — Patient Instructions (Signed)
Good to meet you today.  I do recommend going to [facility] ER at this time for acute work-up of your shortness of breath with exertion and A-fib. You may need to be admitted.  I have also sent a cream for your rash to the pharmacy.   I will follow up with you after this ER visit.

## 2021-02-14 ENCOUNTER — Other Ambulatory Visit: Payer: Self-pay

## 2021-02-14 ENCOUNTER — Encounter (HOSPITAL_BASED_OUTPATIENT_CLINIC_OR_DEPARTMENT_OTHER): Payer: Self-pay

## 2021-02-14 ENCOUNTER — Inpatient Hospital Stay (HOSPITAL_BASED_OUTPATIENT_CLINIC_OR_DEPARTMENT_OTHER)
Admission: EM | Admit: 2021-02-14 | Discharge: 2021-02-18 | DRG: 291 | Disposition: A | Discharge status: Home Health | Payer: HMO | Attending: Internal Medicine | Admitting: Internal Medicine

## 2021-02-14 ENCOUNTER — Emergency Department (HOSPITAL_BASED_OUTPATIENT_CLINIC_OR_DEPARTMENT_OTHER): Payer: HMO

## 2021-02-14 DIAGNOSIS — I480 Paroxysmal atrial fibrillation: Secondary | ICD-10-CM | POA: Diagnosis present

## 2021-02-14 DIAGNOSIS — Z7982 Long term (current) use of aspirin: Secondary | ICD-10-CM | POA: Diagnosis not present

## 2021-02-14 DIAGNOSIS — J811 Chronic pulmonary edema: Secondary | ICD-10-CM | POA: Diagnosis not present

## 2021-02-14 DIAGNOSIS — K529 Noninfective gastroenteritis and colitis, unspecified: Secondary | ICD-10-CM | POA: Diagnosis not present

## 2021-02-14 DIAGNOSIS — Z951 Presence of aortocoronary bypass graft: Secondary | ICD-10-CM | POA: Diagnosis not present

## 2021-02-14 DIAGNOSIS — Z807 Family history of other malignant neoplasms of lymphoid, hematopoietic and related tissues: Secondary | ICD-10-CM | POA: Diagnosis not present

## 2021-02-14 DIAGNOSIS — Z79899 Other long term (current) drug therapy: Secondary | ICD-10-CM | POA: Diagnosis not present

## 2021-02-14 DIAGNOSIS — N400 Enlarged prostate without lower urinary tract symptoms: Secondary | ICD-10-CM | POA: Diagnosis present

## 2021-02-14 DIAGNOSIS — F17290 Nicotine dependence, other tobacco product, uncomplicated: Secondary | ICD-10-CM | POA: Diagnosis not present

## 2021-02-14 DIAGNOSIS — I5033 Acute on chronic diastolic (congestive) heart failure: Secondary | ICD-10-CM | POA: Diagnosis not present

## 2021-02-14 DIAGNOSIS — R197 Diarrhea, unspecified: Secondary | ICD-10-CM

## 2021-02-14 DIAGNOSIS — Z800 Family history of malignant neoplasm of digestive organs: Secondary | ICD-10-CM | POA: Diagnosis not present

## 2021-02-14 DIAGNOSIS — E1051 Type 1 diabetes mellitus with diabetic peripheral angiopathy without gangrene: Secondary | ICD-10-CM | POA: Diagnosis present

## 2021-02-14 DIAGNOSIS — Z95828 Presence of other vascular implants and grafts: Secondary | ICD-10-CM | POA: Diagnosis not present

## 2021-02-14 DIAGNOSIS — I110 Hypertensive heart disease with heart failure: Principal | ICD-10-CM | POA: Diagnosis present

## 2021-02-14 DIAGNOSIS — Z794 Long term (current) use of insulin: Secondary | ICD-10-CM

## 2021-02-14 DIAGNOSIS — E1165 Type 2 diabetes mellitus with hyperglycemia: Secondary | ICD-10-CM | POA: Diagnosis not present

## 2021-02-14 DIAGNOSIS — I2510 Atherosclerotic heart disease of native coronary artery without angina pectoris: Secondary | ICD-10-CM | POA: Diagnosis present

## 2021-02-14 DIAGNOSIS — Z9641 Presence of insulin pump (external) (internal): Secondary | ICD-10-CM | POA: Diagnosis not present

## 2021-02-14 DIAGNOSIS — E109 Type 1 diabetes mellitus without complications: Secondary | ICD-10-CM

## 2021-02-14 DIAGNOSIS — Z20822 Contact with and (suspected) exposure to covid-19: Secondary | ICD-10-CM | POA: Diagnosis present

## 2021-02-14 DIAGNOSIS — J90 Pleural effusion, not elsewhere classified: Secondary | ICD-10-CM | POA: Diagnosis not present

## 2021-02-14 DIAGNOSIS — D509 Iron deficiency anemia, unspecified: Secondary | ICD-10-CM | POA: Diagnosis not present

## 2021-02-14 DIAGNOSIS — D649 Anemia, unspecified: Secondary | ICD-10-CM

## 2021-02-14 DIAGNOSIS — I10 Essential (primary) hypertension: Secondary | ICD-10-CM | POA: Diagnosis present

## 2021-02-14 DIAGNOSIS — E785 Hyperlipidemia, unspecified: Secondary | ICD-10-CM | POA: Diagnosis not present

## 2021-02-14 DIAGNOSIS — J9811 Atelectasis: Secondary | ICD-10-CM | POA: Diagnosis not present

## 2021-02-14 DIAGNOSIS — I4819 Other persistent atrial fibrillation: Secondary | ICD-10-CM | POA: Diagnosis not present

## 2021-02-14 DIAGNOSIS — Z7901 Long term (current) use of anticoagulants: Secondary | ICD-10-CM | POA: Diagnosis not present

## 2021-02-14 DIAGNOSIS — I509 Heart failure, unspecified: Secondary | ICD-10-CM

## 2021-02-14 DIAGNOSIS — I5021 Acute systolic (congestive) heart failure: Secondary | ICD-10-CM | POA: Diagnosis not present

## 2021-02-14 DIAGNOSIS — R001 Bradycardia, unspecified: Secondary | ICD-10-CM

## 2021-02-14 LAB — RESP PANEL BY RT-PCR (FLU A&B, COVID) ARPGX2
Influenza A by PCR: NEGATIVE
Influenza B by PCR: NEGATIVE
SARS Coronavirus 2 by RT PCR: NEGATIVE

## 2021-02-14 LAB — CBC
HCT: 34.1 % — ABNORMAL LOW (ref 39.0–52.0)
Hemoglobin: 10.9 g/dL — ABNORMAL LOW (ref 13.0–17.0)
MCH: 30.8 pg (ref 26.0–34.0)
MCHC: 32 g/dL (ref 30.0–36.0)
MCV: 96.3 fL (ref 80.0–100.0)
Platelets: 178 10*3/uL (ref 150–400)
RBC: 3.54 MIL/uL — ABNORMAL LOW (ref 4.22–5.81)
RDW: 14.3 % (ref 11.5–15.5)
WBC: 4.5 10*3/uL (ref 4.0–10.5)
nRBC: 0 % (ref 0.0–0.2)

## 2021-02-14 LAB — BRAIN NATRIURETIC PEPTIDE: B Natriuretic Peptide: 187.4 pg/mL — ABNORMAL HIGH (ref 0.0–100.0)

## 2021-02-14 LAB — TROPONIN I (HIGH SENSITIVITY)
Troponin I (High Sensitivity): 14 ng/L (ref <18)
Troponin I (High Sensitivity): 15 ng/L (ref <18)

## 2021-02-14 LAB — BASIC METABOLIC PANEL
Anion gap: 4 — ABNORMAL LOW (ref 5–15)
BUN: 15 mg/dL (ref 8–23)
CO2: 32 mmol/L (ref 22–32)
Calcium: 8.6 mg/dL — ABNORMAL LOW (ref 8.9–10.3)
Chloride: 104 mmol/L (ref 98–111)
Creatinine, Ser: 0.63 mg/dL (ref 0.61–1.24)
GFR, Estimated: >60 mL/min (ref >60)
Glucose, Bld: 132 mg/dL — ABNORMAL HIGH (ref 70–99)
Potassium: 4 mmol/L (ref 3.5–5.1)
Sodium: 140 mmol/L (ref 135–145)

## 2021-02-14 MED ORDER — FUROSEMIDE 10 MG/ML IJ SOLN
40.0000 mg | Freq: Once | INTRAMUSCULAR | Status: AC
  Administered 2021-02-14: 40 mg via INTRAVENOUS
  Filled 2021-02-14: qty 4

## 2021-02-14 MED ORDER — APIXABAN 5 MG PO TABS
5.0000 mg | ORAL_TABLET | Freq: Two times a day (BID) | ORAL | Status: DC
  Administered 2021-02-14 – 2021-02-18 (×9): 5 mg via ORAL
  Filled 2021-02-14 (×2): qty 1
  Filled 2021-02-14: qty 2
  Filled 2021-02-14 (×6): qty 1

## 2021-02-14 NOTE — ED Provider Notes (Signed)
[place] EMERGENCY DEPT Provider Note   CSN: [id number] Arrival date & time: 02/14/21  6644     History  Chief Complaint  Patient presents with   Follow-up    [name] [name] is a 66 y.o. male.  HPI  66 year old male with a medical history significant for DM 2, aortic dissection status post repair over 15 years ago, CAD status post CABG, HLD, PVD, BPH, paroxysmal atrial fibrillation on Eliquis who presents to the emergency department with shortness of breath.  This was read by the patient, the patient's wife and the electronic medical record.  Briefly, the patient was seen in telemedicine clinic yesterday due to shortness of breath for the past 2 weeks.  He endorses dyspnea on exertion, orthopnea with an inability to lie flat at night due to persistent dyspnea.  He denies any chest pain.  He was found to be in atrial fibrillation on EKG and has been compliant with his Eliquis.  He states that he has had a 38 lb pound weight gain over the past 4 weeks.  He denies any fevers or chills.  Home Medications Prior to Admission medications   Medication Sig Start Date End Date Taking? Authorizing Provider  apixaban (ELIQUIS) 5 MG TABS tablet Take 1 tablet (5 mg total) by mouth 2 (two) times daily. 09/11/20  Yes Janith Lima, MD  aspirin EC 325 MG tablet Take 325 mg by mouth daily.   Yes [provider]  atorvastatin (LIPITOR) 40 MG tablet Take 1 tablet (40 mg total) by mouth daily. 08/31/20  Yes Janith Lima, MD  B Complex Vitamins (B COMPLEX-B12 PO) Take 1 tablet by mouth daily.    Yes [provider]  Cholecalciferol (VITAMIN D) 125 MCG (5000 UT) CAPS Take 5,000 Units by mouth daily.    Yes [provider]  Insulin Disposable Pump (OMNIPOD 5 G6 INTRO, GEN 5,) KIT 1 Device by Does not apply route every 3 (three) days. 11/09/20  Yes Renato Shin, MD  PARoxetine (PAXIL) 20 MG tablet Take 1 tablet (20 mg total) by mouth daily. 08/31/20  Yes  Janith Lima, MD  BD PEN NEEDLE NANO 2ND GEN 32G X 4 MM MISC USE FOR INSULIN 10/05/20   Janith Lima, MD  Blood Glucose Monitoring Suppl ([place]) w/Device KIT  10/07/20   [provider]  Continuous Blood Gluc Sensor (DEXCOM G6 SENSOR) MISC 1 Device by Does not apply route See admin instructions. Change every 10 days 08/16/20   Renato Shin, MD  Continuous Blood Gluc Transmit (DEXCOM G6 TRANSMITTER) MISC Use as instructed to check blood sugar 01/03/21   Renato Shin, MD  Lancets [facility] DELICA PLUS [id number]) MISC Apply 1 each topically 4 (four) times daily. 10/05/20   [provider]  lidocaine (LIDODERM) 5 % Place 1 patch onto the skin daily. Remove & Discard patch within 12 hours or as directed by MD Patient not taking: Reported on 02/14/2021 12/10/20   Sherwood Gambler, MD  mirtazapine (REMERON) 15 MG tablet Take 1 tablet (15 mg total) by mouth at bedtime. Patient not taking: Reported on 02/14/2021 08/31/20   Janith Lima, MD  [facility] VERIO test strip 1 each 4 (four) times daily. 10/05/20   [provider]  traZODone (DESYREL) 50 MG tablet Take 1 tablet (50 mg total) by mouth at bedtime. Patient not taking: Reported on 02/14/2021 01/13/20   Janith Lima, MD  triamcinolone cream (KENALOG) 0.1 % Apply 1 application topically  2 (two) times daily. Patient not taking: Reported on 02/14/2021 02/13/21   [name], [name], PA-C      Allergies    Patient has no known allergies.    Review of Systems   Review of Systems  Respiratory:  Positive for cough and shortness of breath.   Cardiovascular:  Positive for leg swelling. Negative for chest pain.  All other systems reviewed and are negative.  Physical Exam Updated Vital Signs BP (!) 159/65 (BP Location: Right Arm)    Pulse (!) 59    Temp 97.8 F (36.6 C) (Oral)    Resp 20    Ht 6\' 2"  (1.88 m)    Wt 86.1 kg    SpO2 93%    BMI 24.37 kg/m  Physical Exam Vitals and nursing note reviewed.   Constitutional:      General: He is not in acute distress.    Appearance: He is well-developed.  HENT:     Head: Normocephalic and atraumatic.  Eyes:     Conjunctiva/sclera: Conjunctivae normal.     Pupils: Pupils are equal, round, and reactive to light.  Neck:     Vascular: JVD present.     Comments: Mild JVD Cardiovascular:     Rate and Rhythm: Normal rate and regular rhythm.     Heart sounds: No murmur heard. Pulmonary:     Effort: Pulmonary effort is normal. No respiratory distress.     Breath sounds: Normal breath sounds.  Abdominal:     General: There is no distension.     Palpations: Abdomen is soft.     Tenderness: There is no abdominal tenderness. There is no guarding.  Musculoskeletal:        General: No swelling, deformity or signs of injury.     Cervical back: Neck supple.     Right lower leg: Edema present.     Left lower leg: Edema present.     Comments: 2+ pitting edema bilaterally  Skin:    General: Skin is warm and dry.     Capillary Refill: Capillary refill takes less than 2 seconds.     Findings: No lesion or rash.  Neurological:     General: No focal deficit present.     Mental Status: He is alert. Mental status is at baseline.  Psychiatric:        Mood and Affect: Mood normal.    ED Results / Procedures / Treatments   Labs (all labs ordered are listed, but only abnormal results are displayed) Labs Reviewed  BASIC METABOLIC PANEL - Abnormal; Notable for the following components:      Result Value   Glucose, Bld 132 (*)    Calcium 8.6 (*)    Anion gap 4 (*)    All other components within normal limits  CBC - Abnormal; Notable for the following components:   RBC 3.54 (*)    Hemoglobin 10.9 (*)    HCT 34.1 (*)    All other components within normal limits  BRAIN NATRIURETIC PEPTIDE - Abnormal; Notable for the following components:   B Natriuretic Peptide 187.4 (*)    All other components within normal limits  RESP PANEL BY RT-PCR (FLU A&B,  COVID) [id number]  TROPONIN I (HIGH SENSITIVITY)  TROPONIN I (HIGH SENSITIVITY)    EKG EKG Interpretation  Date/Time:  Tuesday February 14 2021 08:38:26 EST Ventricular Rate:  66 PR Interval:    QRS Duration: 84 QT Interval:  446 QTC Calculation: 467 R Axis:  14 Text Interpretation: Atrial fibrillation Anteroseptal infarct , age undetermined Abnormal ECG When compared with ECG of 10-Dec-2020 09:50, PREVIOUS ECG IS PRESENT Confirmed by Regan Lemming ([id number]) on 02/14/2021 8:41:47 AM  Radiology DG Chest 2 View  Result Date: 02/14/2021 CLINICAL DATA:  Shortness of breath. EXAM: CHEST - 2 VIEW COMPARISON:  Chest x-ray dated December 10, 2020. FINDINGS: Heart is at the upper limits of normal in size status post CABG. New pulmonary vascular congestion and mild interstitial thickening. New moderate left and small right pleural effusions with left greater than right lower lobe atelectasis. No pneumothorax. No acute osseous abnormality. IMPRESSION: 1. New congestive heart failure. Electronically Signed   By: Titus Dubin M.D.   On: 02/14/2021 09:06    Procedures Procedures    Medications Ordered in ED Medications  apixaban (ELIQUIS) tablet 5 mg (5 mg Oral Given 02/14/21 1231)  furosemide (LASIX) injection 40 mg (40 mg Intravenous Given 02/14/21 0931)    ED Course/ Medical Decision Making/ A&P                           Medical Decision Making Amount and/or Complexity of Data Reviewed Labs: ordered. Radiology: ordered.  Risk Prescription drug management. Decision regarding hospitalization.   66 year old male with a medical history significant for DM 2, aortic dissection status post repair over 15 years ago, CAD status post CABG, HLD, PVD, BPH, paroxysmal atrial fibrillation on Eliquis who presents to the emergency department with shortness of breath.  This was read by the patient, the patient's wife and the electronic medical record.  Briefly, the patient was seen in telemedicine clinic  yesterday due to shortness of breath for the past 2 weeks.  He endorses dyspnea on exertion, orthopnea with an inability to lie flat at night due to persistent dyspnea.  He denies any chest pain.  He was found to be in atrial fibrillation on EKG and has been compliant with his Eliquis.  He states that he has had a 38 lb pound weight gain over the past 4 weeks.  He denies any fevers or chills.  On arrival, the patient was afebrile, hemodynamically stable, hypertensive BP 187/103, not tachypneic, saturating 98% on room air.  The patient presents with a close to 40 pound weight gain over 4 weeks with progressive symptoms of dyspnea and orthopnea and bilateral lower extremity edema.  On exam, the patient had signs of volume overload with JVD, 2+ pitting edema bilaterally.  An EKG was performed on arrival which revealed atrial fibrillation, no RVR, ventricular rate 66, chest x-ray revealed new congestive heart failure with pulmonary vascular congestion and interstitial thickening suggestive of pulmonary edema with a heart size close to the upper limits of normal.  Concern for new onset CHF.  COVID-19 and influenza testing was collected and resulted negative.  The patient had negative delta troponins.  He had a CBC without a leukocytosis, stable anemia to 10.9, BMP moderately and nonspecifically elevated to 187, BMP generally unremarkable.  Given the patient's findings, he was administered 40 mg of IV Lasix for diuresis.  His Eliquis was continued.  Hospitalist medicine was consulted for admission for further evaluation of new onset CHF and IV diuresis.   Final Clinical Impression(s) / ED Diagnoses Final diagnoses:  Acute congestive heart failure, unspecified heart failure type (Between)    Rx / DC Orders ED Discharge Orders     None         Regan Lemming, MD  02/14/21 2222 ° °

## 2021-02-14 NOTE — ED Notes (Signed)
Pt brought into room with wife, placed in gown and on cardiac monitor. Pt refusing any labs or IVs at this time. Denies CP but endorses SOB for a week.

## 2021-02-14 NOTE — ED Notes (Signed)
Report given to [facility]. 

## 2021-02-14 NOTE — ED Triage Notes (Signed)
Upon arrival pt refused an EKG or to be evaluated by our EDP. Pt and spouse thought he was just supposed to come here for a CXR. Pt seen yesterday by his PCP and was sent to our ED for a further work up, per provider note in Epic   Pt reports he was seen yesterday for a routine visit with a new provider.  Pt reports his only concern is "trouble breathing, unable to get in enough air".  EKG yesterday showed pt to be in A-Fib, pt denies a hx of A-fib, per spouse he takes Eliquis for A-fib  Pt denies CP.

## 2021-02-14 NOTE — ED Notes (Signed)
Report to the floor attempted but told the RN was busy and try back later.

## 2021-02-14 NOTE — ED Notes (Addendum)
The floor RN was told to call [facility] when they were ready for report.

## 2021-02-15 ENCOUNTER — Inpatient Hospital Stay (HOSPITAL_COMMUNITY): Payer: HMO

## 2021-02-15 ENCOUNTER — Encounter (INDEPENDENT_AMBULATORY_CARE_PROVIDER_SITE_OTHER): Payer: HMO | Admitting: Ophthalmology

## 2021-02-15 DIAGNOSIS — I10 Essential (primary) hypertension: Secondary | ICD-10-CM

## 2021-02-15 DIAGNOSIS — E113513 Type 2 diabetes mellitus with proliferative diabetic retinopathy with macular edema, bilateral: Secondary | ICD-10-CM

## 2021-02-15 DIAGNOSIS — I5033 Acute on chronic diastolic (congestive) heart failure: Secondary | ICD-10-CM

## 2021-02-15 DIAGNOSIS — I5021 Acute systolic (congestive) heart failure: Secondary | ICD-10-CM

## 2021-02-15 DIAGNOSIS — H4312 Vitreous hemorrhage, left eye: Secondary | ICD-10-CM

## 2021-02-15 DIAGNOSIS — H35033 Hypertensive retinopathy, bilateral: Secondary | ICD-10-CM

## 2021-02-15 DIAGNOSIS — R197 Diarrhea, unspecified: Secondary | ICD-10-CM

## 2021-02-15 DIAGNOSIS — R001 Bradycardia, unspecified: Secondary | ICD-10-CM

## 2021-02-15 DIAGNOSIS — H25813 Combined forms of age-related cataract, bilateral: Secondary | ICD-10-CM

## 2021-02-15 LAB — URINALYSIS, ROUTINE W REFLEX MICROSCOPIC
Bacteria, UA: NONE SEEN
Bilirubin Urine: NEGATIVE
Glucose, UA: NEGATIVE mg/dL
Ketones, ur: NEGATIVE mg/dL
Leukocytes,Ua: NEGATIVE
Nitrite: NEGATIVE
Protein, ur: 30 mg/dL — AB
Specific Gravity, Urine: 1.008 (ref 1.005–1.030)
pH: 5 (ref 5.0–8.0)

## 2021-02-15 LAB — CBC
HCT: 34.1 % — ABNORMAL LOW (ref 39.0–52.0)
Hemoglobin: 11.4 g/dL — ABNORMAL LOW (ref 13.0–17.0)
MCH: 31.4 pg (ref 26.0–34.0)
MCHC: 33.4 g/dL (ref 30.0–36.0)
MCV: 93.9 fL (ref 80.0–100.0)
Platelets: 164 10*3/uL (ref 150–400)
RBC: 3.63 MIL/uL — ABNORMAL LOW (ref 4.22–5.81)
RDW: 14.3 % (ref 11.5–15.5)
WBC: 5.1 10*3/uL (ref 4.0–10.5)
nRBC: 0 % (ref 0.0–0.2)

## 2021-02-15 LAB — GLUCOSE, CAPILLARY
Glucose-Capillary: 180 mg/dL — ABNORMAL HIGH (ref 70–99)
Glucose-Capillary: 258 mg/dL — ABNORMAL HIGH (ref 70–99)
Glucose-Capillary: 225 mg/dL — ABNORMAL HIGH (ref 70–99)
Glucose-Capillary: 80 mg/dL (ref 70–99)

## 2021-02-15 LAB — BASIC METABOLIC PANEL
Anion gap: 9 (ref 5–15)
BUN: 14 mg/dL (ref 8–23)
CO2: 27 mmol/L (ref 22–32)
Calcium: 8.5 mg/dL — ABNORMAL LOW (ref 8.9–10.3)
Chloride: 104 mmol/L (ref 98–111)
Creatinine, Ser: 0.65 mg/dL (ref 0.61–1.24)
GFR, Estimated: >60 mL/min (ref >60)
Glucose, Bld: 186 mg/dL — ABNORMAL HIGH (ref 70–99)
Potassium: 4.2 mmol/L (ref 3.5–5.1)
Sodium: 140 mmol/L (ref 135–145)

## 2021-02-15 LAB — ECHOCARDIOGRAM COMPLETE
AR max vel: 2.42 cm2
AV Area VTI: 2.07 cm2
AV Area mean vel: 2.15 cm2
AV Mean grad: 3 mmHg
AV Peak grad: 4.1 mmHg
Ao pk vel: 1.01 m/s
Area-P 1/2: 3.87 cm2
Calc EF: 56.7 %
Height: 74 in
MV VTI: 0.55 cm2
S' Lateral: 2.7 cm
Single Plane A2C EF: 55.4 %
Single Plane A4C EF: 58.6 %
Weight: 3035.2 oz

## 2021-02-15 MED ORDER — INSULIN GLARGINE-YFGN 100 UNIT/ML ~~LOC~~ SOLN: 10.0000 [IU] | Freq: Every day | SUBCUTANEOUS | Status: DC

## 2021-02-15 MED ORDER — FUROSEMIDE 10 MG/ML IJ SOLN: 40.0000 mg | Freq: Every day | INTRAMUSCULAR | Status: DC

## 2021-02-15 MED ORDER — PAROXETINE HCL 20 MG PO TABS
20.0000 mg | ORAL_TABLET | Freq: Every day | ORAL | Status: DC
  Administered 2021-02-15 – 2021-02-18 (×4): 20 mg via ORAL
  Filled 2021-02-15 (×4): qty 1

## 2021-02-15 MED ORDER — ATORVASTATIN CALCIUM 40 MG PO TABS
40.0000 mg | ORAL_TABLET | Freq: Every day | ORAL | Status: DC
Start: 2021-02-15 — End: 2021-02-18
  Administered 2021-02-15 – 2021-02-18 (×4): 40 mg via ORAL
  Filled 2021-02-15 (×4): qty 1

## 2021-02-15 MED ORDER — ASPIRIN EC 325 MG PO TBEC
325.0000 mg | DELAYED_RELEASE_TABLET | Freq: Every day | ORAL | Status: DC
  Administered 2021-02-15 – 2021-02-16 (×2): 325 mg via ORAL
  Filled 2021-02-15 (×2): qty 1

## 2021-02-15 MED ORDER — VITAMIN D 25 MCG (1000 UNIT) PO TABS
5000.0000 [IU] | ORAL_TABLET | Freq: Every day | ORAL | Status: DC
  Administered 2021-02-15 – 2021-02-18 (×4): 5000 [IU] via ORAL
  Filled 2021-02-15 (×4): qty 5

## 2021-02-15 MED ORDER — SPIRONOLACTONE 25 MG PO TABS
25.0000 mg | ORAL_TABLET | Freq: Every day | ORAL | Status: DC
  Administered 2021-02-15 – 2021-02-18 (×4): 25 mg via ORAL
  Filled 2021-02-15 (×4): qty 1

## 2021-02-15 MED ORDER — INSULIN ASPART 100 UNIT/ML IJ SOLN
0.0000 [IU] | Freq: Three times a day (TID) | INTRAMUSCULAR | Status: DC
  Administered 2021-02-15 (×2): 8 [IU] via SUBCUTANEOUS
  Administered 2021-02-15: 3 [IU] via SUBCUTANEOUS
  Administered 2021-02-16: 5 [IU] via SUBCUTANEOUS
  Administered 2021-02-16: 2 [IU] via SUBCUTANEOUS
  Administered 2021-02-17 (×2): 11 [IU] via SUBCUTANEOUS

## 2021-02-15 MED ORDER — HYDRALAZINE HCL 20 MG/ML IJ SOLN: 5.0000 mg | Freq: Four times a day (QID) | INTRAMUSCULAR | Status: DC | PRN

## 2021-02-15 MED ORDER — FUROSEMIDE 10 MG/ML IJ SOLN
40.0000 mg | Freq: Two times a day (BID) | INTRAMUSCULAR | Status: DC
  Administered 2021-02-15 – 2021-02-17 (×5): 40 mg via INTRAVENOUS
  Filled 2021-02-15 (×5): qty 4

## 2021-02-15 MED ORDER — INSULIN GLARGINE-YFGN 100 UNIT/ML ~~LOC~~ SOLN
10.0000 [IU] | Freq: Every day | SUBCUTANEOUS | Status: DC
  Administered 2021-02-15 – 2021-02-16 (×2): 10 [IU] via SUBCUTANEOUS
  Filled 2021-02-15 (×3): qty 0.1

## 2021-02-15 MED ORDER — LOSARTAN POTASSIUM 25 MG PO TABS
25.0000 mg | ORAL_TABLET | Freq: Every day | ORAL | Status: DC
  Administered 2021-02-15 – 2021-02-18 (×4): 25 mg via ORAL
  Filled 2021-02-15 (×4): qty 1

## 2021-02-15 MED ORDER — INSULIN ASPART 100 UNIT/ML IJ SOLN
3.0000 [IU] | Freq: Three times a day (TID) | INTRAMUSCULAR | Status: DC
  Administered 2021-02-15 – 2021-02-16 (×3): 3 [IU] via SUBCUTANEOUS

## 2021-02-15 MED ORDER — LOPERAMIDE HCL 2 MG PO CAPS: 2.0000 mg | ORAL_CAPSULE | Freq: Two times a day (BID) | ORAL | Status: DC | PRN

## 2021-02-15 NOTE — Progress Notes (Signed)
° °  Echocardiogram 2D Echocardiogram has been performed.  [name] [name] 02/15/2021, 9:09 AM

## 2021-02-15 NOTE — TOC Progression Note (Addendum)
Transition of Care [facility]) - Progression Note    Patient Details  Name: [name] [name] MRN: [id number] Date of Birth: 1955/04/30  Transition of Care [facility] CM/SW Contact  Zenon Mayo, RN Phone Number: 02/15/2021, 12:33 PM  Clinical Narrative:    from home, CHF, afib, conts on iv lasix, 2 liters, on eliquis. TOC will continue to follow for dc needs.        Expected Discharge Plan and Services                                                 Social Determinants of Health (SDOH) Interventions    Readmission Risk Interventions No flowsheet data found.

## 2021-02-15 NOTE — Significant Event (Signed)
During bed shift report patient experiencing SOB with 02 sat 87s with congested cough and crackles. Applied 2l of 02 sats now 96% on 2L. Paging MD to make aware.

## 2021-02-15 NOTE — H&P (Signed)
History and Physical    Patient: [name] [name] MSX:[id number] DOB: Sep 17, 1955 DOA: 02/14/2021 DOS: the patient was seen and examined on 02/15/2021 PCP: [name], [name], PA-C  Patient coming from: Home  Chief Complaint: shortness of breath Chief Complaint  Patient presents with   Follow-up    HPI: [name] [name] is a 66 y.o. male with medical history significant of CAD s/p CABG, s/p aortic dissection repair, PVD, paroxysmal atrial fibrillation on Eliquis, type 1 diabetes on insulin pump, hypertension who presents with increasing shortness of breath.  For the past 5 days he has noticed increasing shortness of breath both at rest and with exertion.  Also has orthopnea and has to sleep sitting up.  Has new lower extremity edema.  Also has cough productive of sputum but no fever.  Notes 2 weeks of diarrhea but no melena or bright red blood per rectum.  Has been feeling dizzy.  No chest pain or palpitations.  In the ED, he was afebrile, hypertensive up to BP of 180/103, bradycardic in the 50s on room air.  No leukocytosis, hemoglobin of 10.9 from a prior of 13.  BMP unremarkable. BNP of 187.  Troponin of 14 and 15.  Chest x-ray shows new moderate left and small right pleural effusion.  New pulmonary vascular congestion and interstitial thickening consistent with CHF.    Review of Systems: As mentioned in the history of present illness. All other systems reviewed and are negative. Past Medical History:  Diagnosis Date   Anxiety    Arthritis    Cataract    Constipation    pt states goes EOD- hard stools    Coronary artery disease    Depression    Diabetes mellitus without complication (HCC)    Diabetic retinopathy ([place])    DKA (diabetic ketoacidoses)    Hyperlipidemia    Hypertension    off meds   Hypertensive retinopathy    Psoriasis    Past Surgical History:  Procedure Laterality Date   ABDOMINAL AORTOGRAM W/LOWER EXTREMITY N/A 08/30/2020   Procedure: ABDOMINAL  AORTOGRAM W/LOWER EXTREMITY;  Surgeon: Serafina Mitchell, MD;  Location: [place] [facility];  Service: Cardiovascular;  Laterality: N/A;   ABDOMINAL AORTOGRAM W/LOWER EXTREMITY N/A 11/15/2020   Procedure: ABDOMINAL AORTOGRAM W/LOWER EXTREMITY;  Surgeon: Serafina Mitchell, MD;  Location: [place] [facility];  Service: Cardiovascular;  Laterality: N/A;   ANKLE SURGERY     right   CARDIAC CATHETERIZATION  09/25/2001   COLONOSCOPY     ~10 yr ago- normal  per pt    CORONARY ARTERY BYPASS GRAFT  09/30/2001   CABG x 4   IR THORACENTESIS ASP PLEURAL SPACE W/IMG GUIDE  05/19/2020   LAPAROSCOPIC INGUINAL HERNIA REPAIR Bilateral 02/09/2010   MASS EXCISION Left 03/12/2013   Procedure: LEFT INDEX EXCISION MASS AND DEBRIDMENT DISTAL INTERPHALANGEAL JOINT;  Surgeon: Tennis Must, MD;  Location: [place];  Service: Orthopedics;  Laterality: Left;   PERIPHERAL VASCULAR BALLOON ANGIOPLASTY Left 08/30/2020   Procedure: PERIPHERAL VASCULAR BALLOON ANGIOPLASTY;  Surgeon: Serafina Mitchell, MD;  Location: [place] [facility];  Service: Cardiovascular;  Laterality: Left;  Peroneal   Social History:  reports that he has been smoking cigars. He has never used smokeless tobacco. He reports current alcohol use. He reports that he does not use drugs.  No Known Allergies  Family History  Problem Relation Age of Onset   Lymphoma Father    Cancer Paternal Grandmother  Colon Cancer   Colon cancer Paternal Grandmother    Colon polyps Neg Hx    Esophageal cancer Neg Hx    Rectal cancer Neg Hx    Stomach cancer Neg Hx    Diabetes Mellitus I Neg Hx     Prior to Admission medications   Medication Sig Start Date End Date Taking? Authorizing Provider  apixaban (ELIQUIS) 5 MG TABS tablet Take 1 tablet (5 mg total) by mouth 2 (two) times daily. 09/11/20  Yes Janith Lima, MD  aspirin EC 325 MG tablet Take 325 mg by mouth daily.   Yes [provider]  atorvastatin (LIPITOR) 40 MG tablet Take 1  tablet (40 mg total) by mouth daily. 08/31/20  Yes Janith Lima, MD  B Complex Vitamins (B COMPLEX-B12 PO) Take 1 tablet by mouth daily.    Yes [provider]  Cholecalciferol (VITAMIN D) 125 MCG (5000 UT) CAPS Take 5,000 Units by mouth daily.    Yes [provider]  Insulin Disposable Pump (OMNIPOD 5 G6 INTRO, GEN 5,) KIT 1 Device by Does not apply route every 3 (three) days. 11/09/20  Yes Renato Shin, MD  PARoxetine (PAXIL) 20 MG tablet Take 1 tablet (20 mg total) by mouth daily. 08/31/20  Yes Janith Lima, MD  BD PEN NEEDLE NANO 2ND GEN 32G X 4 MM MISC USE FOR INSULIN 10/05/20   Janith Lima, MD  Blood Glucose Monitoring Suppl ([place]) w/Device KIT  10/07/20   [provider]  Continuous Blood Gluc Sensor (DEXCOM G6 SENSOR) MISC 1 Device by Does not apply route See admin instructions. Change every 10 days 08/16/20   Renato Shin, MD  Continuous Blood Gluc Transmit (DEXCOM G6 TRANSMITTER) MISC Use as instructed to check blood sugar 01/03/21   Renato Shin, MD  Lancets [facility] DELICA PLUS [id number]) MISC Apply 1 each topically 4 (four) times daily. 10/05/20   [provider]  lidocaine (LIDODERM) 5 % Place 1 patch onto the skin daily. Remove & Discard patch within 12 hours or as directed by MD Patient not taking: Reported on 02/14/2021 12/10/20   Sherwood Gambler, MD  mirtazapine (REMERON) 15 MG tablet Take 1 tablet (15 mg total) by mouth at bedtime. Patient not taking: Reported on 02/14/2021 08/31/20   Janith Lima, MD  [facility] VERIO test strip 1 each 4 (four) times daily. 10/05/20   [provider]  traZODone (DESYREL) 50 MG tablet Take 1 tablet (50 mg total) by mouth at bedtime. Patient not taking: Reported on 02/14/2021 01/13/20   Janith Lima, MD  triamcinolone cream (KENALOG) 0.1 % Apply 1 application topically 2 (two) times daily. Patient not taking: Reported on 02/14/2021 02/13/21   [name], [name], PA-C     Physical Exam: Vitals:   02/14/21 1600 02/14/21 1830 02/14/21 1912 02/14/21 2014  BP: (!) 146/84 (!) 154/78  (!) 159/65  Pulse: 70 (!) 58 72 (!) 59  Resp: 16 16 17 20   Temp:   98 F (36.7 C) 97.8 F (36.6 C)  TempSrc:   Oral Oral  SpO2:  92% 95% 93%  Weight:   86.1 kg   Height:   6' 2"  (1.88 m)    Constitutional: NAD, calm, comfortable, elderly gentleman sitting upright in bed  Eyes: lids and conjunctivae normal ENMT: Mucous membranes are moist.  Neck: normal, supple,  Respiratory: clear to auscultation bilaterally, no wheezing, no crackles. Normal respiratory effort on room air.  Cardiovascular: Regular rate and  rhythm, no murmurs / rubs / gallops. +2 pitting edema of distal pre-tibial region of bilateral LE.  Abdomen: no tenderness, Bowel sounds positive.  Musculoskeletal: no clubbing / cyanosis. No joint deformity upper and lower extremities. Good ROM, no contractures. Normal muscle tone.  Skin: no rashes, lesions, ulcers.  Neurologic: CN 2-12 grossly intact. Strength 5/5 in all 4 but required assistance to sit forward in bed.  Psychiatric: Normal judgment and insight. Alert and oriented x 3. Normal mood.  Data Reviewed:  EKG on my review with left axis deviation and atrial fibrillation and no significant ST or T wave abnormalities.  Assessment and Plan:  New onset CHF exacerbation - Presented with symptoms of dyspnea, orthopnea and lower extremity edema.  Troponin is reassuring at 14 and 5.  BMP of 187 but chest x-ray showing pulmonary vascular congestion and bilateral pleural effusion. -Continue IV 40 mg Lasix daily - Strict intake and output, daily weights -Obtain echocardiogram -Last echo on 05/2020 with EF of 55 to 60%, borderline aortic root and ascending aorta dilatation  Anemia - Hemoglobin of 10.9 from 13 just 2 months ago -Denies any melena.  Could possibly be dilution from hypervolemia.  Continue to follow with diuresis before further  work-up.  Bradycardia  Asymptomatic.  Follow echocardiogram as above. Continue to monitor on continuous telemetry  HTN  elevated  PRN hydralazine for systolic greater than 799/872  Persistent diarrhea reports for 2 weeks check GI panel  Paroxysmal atrial fibrillation Continue Eliquis  Type 2 diabetes - Patient followed by endocrinology on insulin pump.  Current regimen is 0.7 units/hr with 4 units bolus BID before 2 meals - will stopped pump while inpatient (Pt documented to have trouble with adjusting pump on his own) and start 10 units semglee and moderate SSI TID   Hx of CAB s/p CABG -continue aspirin, statin       Advance Care Planning:   Code Status: Full Code   Consults: none  Family Communication: No family at bedside   Severity of Illness: The appropriate patient status for this patient is INPATIENT. Inpatient status is judged to be reasonable and necessary in order to provide the required intensity of service to ensure the patient's safety. The patient's presenting symptoms, physical exam findings, and initial radiographic and laboratory data in the context of their chronic comorbidities is felt to place them at high risk for further clinical deterioration. Furthermore, it is not anticipated that the patient will be medically stable for discharge from the hospital within 2 midnights of admission.   * I certify that at the point of admission it is my clinical judgment that the patient will require inpatient hospital care spanning beyond 2 midnights from the point of admission due to high intensity of service, high risk for further deterioration and high frequency of surveillance required.*  Author: Orene Desanctis, DO 02/15/2021 12:01 AM  For on call review [email or web address].

## 2021-02-15 NOTE — Progress Notes (Signed)
Inpatient Diabetes Program Recommendations  AACE/ADA: New Consensus Statement on Inpatient Glycemic Control (2015)  Target Ranges:  Prepandial:   less than 140 mg/dL      Peak postprandial:   less than 180 mg/dL (1-2 hours)      Critically ill patients:  140 - 180 mg/dL   Lab Results  Component Value Date   GLUCAP 180 (H) 02/15/2021   HGBA1C 7.1 (A) 01/12/2021    Review of Glycemic Control  Latest Reference Range & Units 02/15/21 06:12  Glucose-Capillary 70 - 99 mg/dL 180 (H)   Diabetes history: DM 2 Outpatient Diabetes medications:  Omnipod- 0.7 units/hr (total basal=16.8 units/24 hours) with 4 units bid for meals Current orders for Inpatient glycemic control:  Novolog moderate tid with meals and HS Novolog 3 units tid with meals Semglee 10 units daily (starts on 02/16/21) Inpatient Diabetes Program Recommendations:    Note patient wears insulin pump.  Per patient insulin has run out and pump is off.  He wears CGM as well that is still working.  Explained that we would do basal/bolus insulin while in the hospital.   Need to start Semglee today since insulin pump is off. Discussed with RN.   Thanks,  Adah Perl, RN, BC-ADM Inpatient Diabetes Coordinator Pager [phone]  (8a-5p)

## 2021-02-15 NOTE — Progress Notes (Signed)
PROGRESS NOTE    [name] [name]  BJS:[id number] DOB: 22-Feb-1955 DOA: 02/14/2021 PCP: Fredirick Lathe, PA-C  Narrative: 66/M with history of CAD/CABG in 2003, aortic dissection repair, peripheral vascular disease, paroxysmal A-fib on Eliquis, type 1 diabetes on insulin pump, hypertension presented to the ED with progressive shortness of breath orthopnea and edema X few weeks. -Also ongoing diarrhea for several months -In the ED BP was 180/103, BNP 187, chest x-ray noted new moderate left and small right pleural effusion, pulmonary vascular congestion and interstitial thickening c/w CHF  Subjective: -Some improvement reported   Assessment and Plan:  New onset CHF -Continue IV Lasix, u/o: 1150 recorded -Follow-up echocardiogram -Strict I's/O, daily weights -Add Aldactone -Will consult cardiology, also check albumin and urinalysis  Normocytic anemia -Could be dilutional, check anemia panel, monitor with diuresis  Type 1 diabetes mellitus -Insulin pump on hold, continue Semglee, add meal coverage  CAD/CABG-2003 -Has not followed up with cardiology in years -Continue aspirin and statin, follow-up echo  Chronic diarrhea -Unclear if this could be malabsorption from CHF, GI pathogen panel ordered, will follow up, clinically do not suspect infectious diarrhea  Paroxysmal atrial fibrillation -Continue Eliquis, heart rate controlled  Hypertension -BP remains elevated, continue IV Lasix, add Aldactone -prn hydralazine  DVT prophylaxis: Apixaban Code Status: Full code Family Communication: Discussed with patient in detail, no family at bedside Disposition Plan:    Consultants:  Cardiology  Procedures:   Antimicrobials:    Objective: Vitals:   02/15/21 0002 02/15/21 0405 02/15/21 0732 02/15/21 1050  BP: (!) 178/87 (!) 162/75 136/71 (!) 174/93  Pulse: 60 72 66 62  Resp: 18 20 17 19   Temp: 99 F (37.2 C) 98.8 F (37.1 C) 98.7 F (37.1 C) 98.5 F (36.9 C)   TempSrc: Oral Oral Oral Oral  SpO2: 93% 92% 97% 97%  Weight:  86 kg    Height:        Intake/Output Summary (Last 24 hours) at 02/15/2021 1424 Last data filed at 02/15/2021 1252 Gross per 24 hour  Intake 840 ml  Output 700 ml  Net 140 ml   Filed Weights   02/14/21 1912 02/15/21 0405  Weight: 86.1 kg 86 kg    Examination:  General exam: Appears calm and comfortable  Respiratory system: Bilateral Rales Cardiovascular system: S1 & S2 heard, RRR.  Abd: nondistended, soft and nontender.Normal bowel sounds heard. Central nervous system: Alert and oriented. No focal neurological deficits. Extremities: 1-2+ edema Skin: No rashes Psychiatry: Judgement and insight appear normal. Mood & affect appropriate.     Data Reviewed:   CBC: Recent Labs  Lab 02/14/21 0926 02/15/21 0414  WBC 4.5 5.1  HGB 10.9* 11.4*  HCT 34.1* 34.1*  MCV 96.3 93.9  PLT 178 607   Basic Metabolic Panel: Recent Labs  Lab 02/14/21 0926 02/15/21 0414  NA 140 140  K 4.0 4.2  CL 104 104  CO2 32 27  GLUCOSE 132* 186*  BUN 15 14  CREATININE 0.63 0.65  CALCIUM 8.6* 8.5*   GFR: Estimated Creatinine Clearance: 102.8 mL/min (by C-G formula based on SCr of 0.65 mg/dL). Liver Function Tests: No results for input(s): AST, ALT, ALKPHOS, BILITOT, PROT, ALBUMIN in the last 168 hours. No results for input(s): LIPASE, AMYLASE in the last 168 hours. No results for input(s): AMMONIA in the last 168 hours. Coagulation Profile: No results for input(s): INR, PROTIME in the last 168 hours. Cardiac Enzymes: No results for input(s): CKTOTAL, CKMB, CKMBINDEX, TROPONINI in the last 168  hours. BNP (last 3 results) No results for input(s): PROBNP in the last 8760 hours. HbA1C: No results for input(s): HGBA1C in the last 72 hours. CBG: Recent Labs  Lab 02/15/21 0612 02/15/21 1127  GLUCAP 180* 258*   Lipid Profile: No results for input(s): CHOL, HDL, LDLCALC, TRIG, CHOLHDL, LDLDIRECT in the last 72  hours. Thyroid Function Tests: No results for input(s): TSH, T4TOTAL, FREET4, T3FREE, THYROIDAB in the last 72 hours. Anemia Panel: No results for input(s): VITAMINB12, FOLATE, FERRITIN, TIBC, IRON, RETICCTPCT in the last 72 hours. Urine analysis:    Component Value Date/Time   COLORURINE YELLOW 05/19/2020 1957   APPEARANCEUR CLEAR 05/19/2020 1957   LABSPEC 1.015 05/19/2020 1957   PHURINE 5.0 05/19/2020 1957   GLUCOSEU 50 (A) 05/19/2020 1957   GLUCOSEU >=1000 (A) 03/16/2020 0958   HGBUR NEGATIVE 05/19/2020 1957   BILIRUBINUR NEGATIVE 05/19/2020 1957   KETONESUR 5 (A) 05/19/2020 1957   PROTEINUR NEGATIVE 05/19/2020 1957   UROBILINOGEN 0.2 03/16/2020 0958   NITRITE NEGATIVE 05/19/2020 1957   LEUKOCYTESUR NEGATIVE 05/19/2020 1957   Sepsis Labs: @LABRCNTIP (procalcitonin:4,lacticidven:4)  ) Recent Results (from the past 240 hour(s))  Resp Panel by RT-PCR (Flu A&B, Covid) Nasopharyngeal Swab     Status: None   Collection Time: 02/14/21 11:15 AM   Specimen: Nasopharyngeal Swab; Nasopharyngeal(NP) swabs in vial transport medium  Result Value Ref Range Status   SARS Coronavirus 2 by RT PCR NEGATIVE NEGATIVE Final    Comment: (NOTE) SARS-CoV-2 target nucleic acids are NOT DETECTED.  The SARS-CoV-2 RNA is generally detectable in upper respiratory specimens during the acute phase of infection. The lowest concentration of SARS-CoV-2 viral copies this assay can detect is 138 copies/mL. A negative result does not preclude SARS-Cov-2 infection and should not be used as the sole basis for treatment or other patient management decisions. A negative result may occur with  improper specimen collection/handling, submission of specimen other than nasopharyngeal swab, presence of viral mutation(s) within the areas targeted by this assay, and inadequate number of viral copies(<138 copies/mL). A negative result must be combined with clinical observations, patient history, and  epidemiological information. The expected result is Negative.  Fact Sheet for Patients:  EntrepreneurPulse.com.au  Fact Sheet for Healthcare Providers:  IncredibleEmployment.be  This test is no t yet approved or cleared by the [place] FDA and  has been authorized for detection and/or diagnosis of SARS-CoV-2 by FDA under an Emergency Use Authorization (EUA). This EUA will remain  in effect (meaning this test can be used) for the duration of the COVID-19 declaration under Section 564(b)(1) of the Act, 21 U.S.C.section 360bbb-3(b)(1), unless the authorization is terminated  or revoked sooner.       Influenza A by PCR NEGATIVE NEGATIVE Final   Influenza B by PCR NEGATIVE NEGATIVE Final    Comment: (NOTE) The Xpert Xpress SARS-CoV-2/FLU/RSV plus assay is intended as an aid in the diagnosis of influenza from Nasopharyngeal swab specimens and should not be used as a sole basis for treatment. Nasal washings and aspirates are unacceptable for Xpert Xpress SARS-CoV-2/FLU/RSV testing.  Fact Sheet for Patients: EntrepreneurPulse.com.au  Fact Sheet for Healthcare Providers: IncredibleEmployment.be  This test is not yet approved or cleared by the [place] FDA and has been authorized for detection and/or diagnosis of SARS-CoV-2 by FDA under an Emergency Use Authorization (EUA). This EUA will remain in effect (meaning this test can be used) for the duration of the COVID-19 declaration under Section 564(b)(1) of the Act, 21 U.S.C. section 360bbb-3(b)(1), unless  the authorization is terminated or revoked.  Performed at [facility], [address] [place], [place] [address]      Radiology Studies: DG Chest 2 View  Result Date: 02/14/2021 CLINICAL DATA:  Shortness of breath. EXAM: CHEST - 2 VIEW COMPARISON:  Chest x-ray dated December 10, 2020. FINDINGS: Heart is at the upper limits of  normal in size status post CABG. New pulmonary vascular congestion and mild interstitial thickening. New moderate left and small right pleural effusions with left greater than right lower lobe atelectasis. No pneumothorax. No acute osseous abnormality. IMPRESSION: 1. New congestive heart failure. Electronically Signed   By: Titus Dubin M.D.   On: 02/14/2021 09:06     Scheduled Meds:  apixaban  5 mg Oral BID   aspirin EC  325 mg Oral Daily   atorvastatin  40 mg Oral Daily   cholecalciferol  5,000 Units Oral Daily   furosemide  40 mg Intravenous BID   insulin aspart  0-15 Units Subcutaneous TID AC & HS   insulin aspart  3 Units Subcutaneous TID WC   insulin glargine-yfgn  10 Units Subcutaneous Daily   PARoxetine  20 mg Oral Daily   Continuous Infusions:   LOS: 1 day    Time spent: 10min   Domenic Polite, MD [facility]   02/15/2021, 2:24 PM

## 2021-02-15 NOTE — Consult Note (Signed)
[facility] Nurse Consult Note: Reason for Consult: right foot wound; followed by the [facility]/Dr. Dellia Nims.  Right foot wound present since 06/21/20; related to DM and PAD/PVD.  Has been recommended for non invasive vascular  Wound type:neuropathic  Pressure Injury POA: NA Measurement:0.7cm length x 0.6cm width x 0.1cm depth per [facility] notes Wound bed:see nursing notes Drainage (amount, consistency, odor) see nursing notes Periwound: intact  Dressing procedure/placement/frequency: Cut to fit silver hydrofiber dressing and place over wound bed top with dry dressing, change every other day. Follow up with wound care center as scheduled.  Continue HHRN per Dr. Janalyn Rouse orders Call [facility] to re-schedule vascular work up appointment.   Discussed POC with patient and bedside nurse.  Re consult if needed, will not follow at this time. Thanks  Dejae Bernet [employer], RN,CWOCN, CNS, [place] [phone])

## 2021-02-15 NOTE — Significant Event (Signed)
Pt overall tired with low grade temp and [id number]. Would like to know results from ECHO.

## 2021-02-15 NOTE — Consult Note (Addendum)
Cardiology Consultation:   Patient ID: Michaiah Holsopple MRN: [id number]; DOB: 06/29/1955  Admit date: 02/14/2021 Date of Consult: 02/15/2021  PCP:  Fredirick Lathe, [facility] Cardiologist:  Evalina Field, MD     Patient Profile:   Damyn Weitzel is a 66 y.o. male with a hx of CAD s/p CABGx3 (2003), PVD, paroxysmal atrial fibrillation on Eliquis, type II diabetes on insulin pump, HTN, s/p aortic aneurysm repair (2003) who is being seen 02/15/2021 for the evaluation of CHF at the request of Dr. Broadus John.  History of Present Illness:   Mr. Stumpe is a 66 year old male with above medical history who has not seen a cardiologist since 2014. Patient was seen by his PCP on 02/13/21. At that visit, he was found to be in afib and reported that he did not have a cardiologist. He complained of SOB that had been ongoing for the past 2 weeks. Patient had also gained 38lbs in the past 4 weeks. Due to these findings, patient was sent to the ED for further workup.   Patient presented to the ED on 2/14 complaining of SOB. Patient initially refused EKG or evaluation because he thought he was just sent here for a chest x-ray. However, he eventually agreed to be evaluated and have labs drawn. Labs in the ED showed Na 140, K 4.0, BUN 15, creatinine 0.63, hemoglobin 10.9, WBC 4.5. BNP elevated to 287.4. HSTN 14>>15. EKG showed atrial fibrillation with a rate of 66, anteroseptal infarct (age undetermined). CXR showed new moderate left and small right pleural effusions, pulmonary vascular congestion consistent with new congestive heart failure. Patient was admitted to medicine and cardiology was asked to consult.   Echo this admission showed LVEF 55-60% (stable since 05/2020), no regional wall motion abnormalities, mild LVH, grade I diastolic dysfunction.   On interview, patient reports having increased SOB on exertion for the past 2 weeks. Denies any chest pain, palpitations.  Occasionally feels dizziness. No syncope. Does not notice any swelling in abdominal distention, but he has noticed some weight gain.    Past Medical History:  Diagnosis Date   Anxiety    Arthritis    Cataract    Constipation    pt states goes EOD- hard stools    Coronary artery disease    Depression    Diabetes mellitus without complication (HCC)    Diabetic retinopathy ([place])    DKA (diabetic ketoacidoses)    Hyperlipidemia    Hypertension    off meds   Hypertensive retinopathy    Psoriasis     Past Surgical History:  Procedure Laterality Date   ABDOMINAL AORTOGRAM W/LOWER EXTREMITY N/A 08/30/2020   Procedure: ABDOMINAL AORTOGRAM W/LOWER EXTREMITY;  Surgeon: Serafina Mitchell, MD;  Location: [facility];  Service: Cardiovascular;  Laterality: N/A;   ABDOMINAL AORTOGRAM W/LOWER EXTREMITY N/A 11/15/2020   Procedure: ABDOMINAL AORTOGRAM W/LOWER EXTREMITY;  Surgeon: Serafina Mitchell, MD;  Location: [facility];  Service: Cardiovascular;  Laterality: N/A;   ANKLE SURGERY     right   CARDIAC CATHETERIZATION  09/25/2001   COLONOSCOPY     ~10 yr ago- normal  per pt    CORONARY ARTERY BYPASS GRAFT  09/30/2001   CABG x 4   IR THORACENTESIS ASP PLEURAL SPACE W/IMG GUIDE  05/19/2020   LAPAROSCOPIC INGUINAL HERNIA REPAIR Bilateral 02/09/2010   MASS EXCISION Left 03/12/2013   Procedure: LEFT INDEX EXCISION MASS AND DEBRIDMENT DISTAL INTERPHALANGEAL JOINT;  Surgeon: Tennis Must, MD;  Location: [facility];  Service: Orthopedics;  Laterality: Left;   PERIPHERAL VASCULAR BALLOON ANGIOPLASTY Left 08/30/2020   Procedure: PERIPHERAL VASCULAR BALLOON ANGIOPLASTY;  Surgeon: Serafina Mitchell, MD;  Location: [facility];  Service: Cardiovascular;  Laterality: Left;  Peroneal     Home Medications:  Prior to Admission medications   Medication Sig Start Date End Date Taking? Authorizing Provider  apixaban (ELIQUIS) 5 MG TABS tablet Take 1 tablet (5 mg total) by mouth 2  (two) times daily. 09/11/20  Yes Janith Lima, MD  aspirin EC 325 MG tablet Take 325 mg by mouth daily.   Yes [provider]  atorvastatin (LIPITOR) 40 MG tablet Take 1 tablet (40 mg total) by mouth daily. 08/31/20  Yes Janith Lima, MD  B Complex Vitamins (B COMPLEX-B12 PO) Take 1 tablet by mouth daily.    Yes [provider]  Cholecalciferol (VITAMIN D) 125 MCG (5000 UT) CAPS Take 5,000 Units by mouth daily.    Yes [provider]  Insulin Disposable Pump (OMNIPOD 5 G6 INTRO, GEN 5,) KIT 1 Device by Does not apply route every 3 (three) days. 11/09/20  Yes Renato Shin, MD  PARoxetine (PAXIL) 20 MG tablet Take 1 tablet (20 mg total) by mouth daily. 08/31/20  Yes Janith Lima, MD  BD PEN NEEDLE NANO 2ND GEN 32G X 4 MM MISC USE FOR INSULIN 10/05/20   Janith Lima, MD  Blood Glucose Monitoring Suppl (Mutual) w/Device KIT  10/07/20   [provider]  Continuous Blood Gluc Sensor (DEXCOM G6 SENSOR) MISC 1 Device by Does not apply route See admin instructions. Change every 10 days 08/16/20   Renato Shin, MD  Continuous Blood Gluc Transmit (DEXCOM G6 TRANSMITTER) MISC Use as instructed to check blood sugar 01/03/21   Renato Shin, MD  Lancets [facility] DELICA PLUS [id number]) MISC Apply 1 each topically 4 (four) times daily. 10/05/20   [provider]  lidocaine (LIDODERM) 5 % Place 1 patch onto the skin daily. Remove & Discard patch within 12 hours or as directed by MD Patient not taking: Reported on 02/14/2021 12/10/20   Sherwood Gambler, MD  mirtazapine (REMERON) 15 MG tablet Take 1 tablet (15 mg total) by mouth at bedtime. Patient not taking: Reported on 02/14/2021 08/31/20   Janith Lima, MD  [facility] VERIO test strip 1 each 4 (four) times daily. 10/05/20   [provider]  traZODone (DESYREL) 50 MG tablet Take 1 tablet (50 mg total) by mouth at bedtime. Patient not taking: Reported on 02/14/2021 01/13/20   Janith Lima,  MD  triamcinolone cream (KENALOG) 0.1 % Apply 1 application topically 2 (two) times daily. Patient not taking: Reported on 02/14/2021 02/13/21   [name], [name], PA-C    Inpatient Medications: Scheduled Meds:  apixaban  5 mg Oral BID   aspirin EC  325 mg Oral Daily   atorvastatin  40 mg Oral Daily   cholecalciferol  5,000 Units Oral Daily   furosemide  40 mg Intravenous BID   insulin aspart  0-15 Units Subcutaneous TID AC & HS   insulin aspart  3 Units Subcutaneous TID WC   insulin glargine-yfgn  10 Units Subcutaneous Daily   PARoxetine  20 mg Oral Daily   spironolactone  25 mg Oral Daily   Continuous Infusions:  PRN Meds: hydrALAZINE, loperamide  Allergies:   No Known Allergies  Social History:   Social  History   Socioeconomic History   Marital status: Divorced    Spouse name: Not on file   Number of children: 4   Years of education: 38   Highest education level: 12th grade  Occupational History   Occupation: Retired  Tobacco Use   Smoking status: Some Days    Types: Cigars   Smokeless tobacco: Never  Vaping Use   Vaping Use: Never used  Substance and Sexual Activity   Alcohol use: Yes    Comment: daily beer or scotch    Drug use: No   Sexual activity: Yes  Other Topics Concern   Not on file  Social History Narrative   Lives with his adult son [name].   Has 4 children, 2 sons- both live in [place], 2 daughters one lives in [place], [place] and youngest daughter attends [employer].    Fiance lives in  [place], [place]   Social Determinants of Health   Financial Resource Strain: Not on file  Food Insecurity: Not on file  Transportation Needs: Not on file  Physical Activity: Not on file  Stress: Not on file  Social Connections: Not on file  Intimate Partner Violence: Not on file    Family History:    Family History  Problem Relation Age of Onset   Lymphoma Father    Cancer Paternal Grandmother        Colon Cancer   Colon cancer  Paternal Grandmother    Colon polyps Neg Hx    Esophageal cancer Neg Hx    Rectal cancer Neg Hx    Stomach cancer Neg Hx    Diabetes Mellitus I Neg Hx      ROS:  Please see the history of present illness.   All other ROS reviewed and negative.     Physical Exam/Data:   Vitals:   02/15/21 0732 02/15/21 1050 02/15/21 1610 02/15/21 1630  BP: 136/71 (!) 174/93 109/74 120/71  Pulse: 66 62 (!) 55 (!) 57  Resp: _0 Temp: 98.7 F (37.1 C) 98.5 F (36.9 C) 99.7 F (37.6 C) 99.1 F (37.3 C)  TempSrc: Oral Oral  Oral  SpO2: 97% 97% 97% 96%  Weight:      Height:        Intake/Output Summary (Last 24 hours) at 02/15/2021 1713 Last data filed at 02/15/2021 1637 Gross per 24 hour  Intake 1320 ml  Output 1700 ml  Net -380 ml   Last 3 Weights 02/15/2021 02/14/2021 02/13/2021  Weight (lbs) 189 lb 11.2 oz 189 lb 13.1 oz 194 lb 4 oz  Weight (kg) 86.047 kg 86.1 kg 88.111 kg  Some encounter information is confidential and restricted. Go to Review Flowsheets activity to see all data.     Body mass index is 24.36 kg/m.  General:  Well nourished, well developed, in no acute distress HEENT: normal Neck: no JVD Vascular: Radial pulses 2+ bilaterally Cardiac:  normal S1, S2; irregular rate and rhythm; no murmur  Lungs:  Decreased lung sounds at bilateral lung bases, bilateral rales Abd: soft, nontender, no hepatomegaly  Ext: 2+ pitting edema to the middle of the shin  Musculoskeletal:  No deformities, BUE and BLE strength normal and equal Skin: warm and dry  Neuro:  CNs 2-12 intact, no focal abnormalities noted Psych:  Normal affect   EKG:  The EKG was personally reviewed and demonstrates:  atrial fibrillation with a rate of 66, anteroseptal infarct (age undetermined) Telemetry:  Telemetry was personally reviewed and  demonstrates:  Atrial fibrillation, rates in the 40s-50s   Relevant CV Studies:  Echo 02/15/21   1. Left ventricular ejection fraction, by estimation, is 55 to 60%.  The  left ventricle has normal function. The left ventricle has no regional  wall motion abnormalities. There is mild left ventricular hypertrophy.  Left ventricular diastolic parameters  are consistent with Grade I diastolic dysfunction (impaired relaxation).   2. Right ventricular systolic function is mildly reduced. The right  ventricular size is normal.   3. Left atrial size was mildly dilated.   4. The mitral valve is normal in structure. Mild mitral valve  regurgitation. No evidence of mitral stenosis.   5. The aortic valve is tricuspid. Aortic valve regurgitation is not  visualized. Aortic valve is calcified. In the parasternal long axis,  cannot rule out some mobility with question of small vegetation. Not noted  in other views. Clinical correlation  suggested.   6. The inferior vena cava is dilated in size with >50% respiratory  variability, suggesting right atrial pressure of 8 mmHg.   7. Left pleural effusion noted.   Laboratory Data:  High Sensitivity Troponin:   Recent Labs  Lab 02/14/21 0926 02/14/21 1115  TROPONINIHS 14 15     Chemistry Recent Labs  Lab 02/14/21 0926 02/15/21 0414  NA 140 140  K 4.0 4.2  CL 104 104  CO2 32 27  GLUCOSE 132* 186*  BUN 15 14  CREATININE 0.63 0.65  CALCIUM 8.6* 8.5*  GFRNONAA >60 >60  ANIONGAP 4* 9    No results for input(s): PROT, ALBUMIN, AST, ALT, ALKPHOS, BILITOT in the last 168 hours. Lipids No results for input(s): CHOL, TRIG, HDL, LABVLDL, LDLCALC, CHOLHDL in the last 168 hours.  Hematology Recent Labs  Lab 02/14/21 0926 02/15/21 0414  WBC 4.5 5.1  RBC 3.54* 3.63*  HGB 10.9* 11.4*  HCT 34.1* 34.1*  MCV 96.3 93.9  MCH 30.8 31.4  MCHC 32.0 33.4  RDW 14.3 14.3  PLT 178 164   Thyroid No results for input(s): TSH, FREET4 in the last 168 hours.  BNP Recent Labs  Lab 02/14/21 0926  BNP 187.4*    DDimer No results for input(s): DDIMER in the last 168 hours.   Radiology/Studies:  DG Chest 2  View  Result Date: 02/14/2021 CLINICAL DATA:  Shortness of breath. EXAM: CHEST - 2 VIEW COMPARISON:  Chest x-ray dated December 10, 2020. FINDINGS: Heart is at the upper limits of normal in size status post CABG. New pulmonary vascular congestion and mild interstitial thickening. New moderate left and small right pleural effusions with left greater than right lower lobe atelectasis. No pneumothorax. No acute osseous abnormality. IMPRESSION: 1. New congestive heart failure. Electronically Signed   By: Titus Dubin M.D.   On: 02/14/2021 09:06   ECHOCARDIOGRAM COMPLETE  Result Date: 02/15/2021    ECHOCARDIOGRAM REPORT   Patient Name:   WOODARD PERRELL Date of Exam: 02/15/2021 Medical Rec #:  [id number]            Height:       74.0 in Accession #:    [id number]           Weight:       189.7 lb Date of Birth:  1955-02-23             BSA:          2.125 m Patient Age:    52 years  BP:           136/71 mmHg Patient Gender: M                    HR:           49 bpm. Exam Location:  Inpatient Procedure: 2D Echo, Cardiac Doppler and Color Doppler Indications:    I50.21 CHF  History:        Patient has prior history of Echocardiogram examinations, most                 recent 05/17/2020. Risk Factors:Diabetes, Dyslipidemia,                 Hypertension and Family History of Coronary Artery Disease. CAD.  Sonographer:    Beryle Beams Referring Phys: [id number] [place] T TU IMPRESSIONS  1. Left ventricular ejection fraction, by estimation, is 55 to 60%. The left ventricle has normal function. The left ventricle has no regional wall motion abnormalities. There is mild left ventricular hypertrophy. Left ventricular diastolic parameters are consistent with Grade I diastolic dysfunction (impaired relaxation).  2. Right ventricular systolic function is mildly reduced. The right ventricular size is normal.  3. Left atrial size was mildly dilated.  4. The mitral valve is normal in structure. Mild mitral valve  regurgitation. No evidence of mitral stenosis.  5. The aortic valve is tricuspid. Aortic valve regurgitation is not visualized. Aortic valve is calcified. In the parasternal long axis, cannot rule out some mobility with question of small vegetation. Not noted in other views. Clinical correlation suggested.  6. The inferior vena cava is dilated in size with >50% respiratory variability, suggesting right atrial pressure of 8 mmHg.  7. Left pleural effusion noted. FINDINGS  Left Ventricle: Left ventricular ejection fraction, by estimation, is 55 to 60%. The left ventricle has normal function. The left ventricle has no regional wall motion abnormalities. The left ventricular internal cavity size was normal in size. There is  mild left ventricular hypertrophy. Left ventricular diastolic parameters are consistent with Grade I diastolic dysfunction (impaired relaxation). Right Ventricle: The right ventricular size is normal. No increase in right ventricular wall thickness. Right ventricular systolic function is mildly reduced. Left Atrium: Left atrial size was mildly dilated. Right Atrium: Right atrial size was normal in size. Pericardium: Left pleural effusion noted. There is no evidence of pericardial effusion. Mitral Valve: The mitral valve is normal in structure. There is mild calcification of the mitral valve leaflet(s). Mild mitral valve regurgitation. No evidence of mitral valve stenosis. MV peak gradient, 56.9 mmHg. The mean mitral valve gradient is 40.0 mmHg. Tricuspid Valve: The tricuspid valve is normal in structure. Tricuspid valve regurgitation is not demonstrated. Aortic Valve: The aortic valve is tricuspid. Aortic valve regurgitation is not visualized. Aortic valve sclerosis/calcification is present, without any evidence of aortic stenosis. Aortic valve mean gradient measures 3.0 mmHg. Aortic valve peak gradient measures 4.1 mmHg. Aortic valve area, by VTI measures 2.07 cm. Pulmonic Valve: The pulmonic  valve was normal in structure. Pulmonic valve regurgitation is not visualized. Aorta: The aortic root is normal in size and structure. Venous: The inferior vena cava is dilated in size with greater than 50% respiratory variability, suggesting right atrial pressure of 8 mmHg. IAS/Shunts: No atrial level shunt detected by color flow Doppler.  LEFT VENTRICLE PLAX 2D LVIDd:         4.20 cm LVIDs:         2.70 cm LV PW:  1.20 cm LV IVS:        1.20 cm LVOT diam:     1.90 cm LV SV:         60 LV SV Index:   28 LVOT Area:     2.84 cm  LV Volumes (MOD) LV vol d, MOD A2C: 111.0 ml LV vol d, MOD A4C: 91.2 ml LV vol s, MOD A2C: 49.5 ml LV vol s, MOD A4C: 37.8 ml LV SV MOD A2C:     61.5 ml LV SV MOD A4C:     91.2 ml LV SV MOD BP:      57.3 ml RIGHT VENTRICLE            IVC RV S prime:     7.83 cm/s  IVC diam: 2.30 cm RVOT diam:      2.60 cm TAPSE (M-mode): 1.0 cm LEFT ATRIUM             Index        RIGHT ATRIUM          Index LA diam:        4.40 cm 2.07 cm/m   RA Area:     9.24 cm LA Vol (A2C):   84.6 ml 39.81 ml/m  RA Volume:   16.40 ml 7.72 ml/m LA Vol (A4C):   68.0 ml 32.00 ml/m LA Biplane Vol: 78.7 ml 37.03 ml/m  AORTIC VALVE                    PULMONIC VALVE AV Area (Vmax):    2.42 cm     PV Vmax:       0.57 m/s AV Area (Vmean):   2.15 cm     PV Vmean:      42.400 cm/s AV Area (VTI):     2.07 cm     PV VTI:        0.141 m AV Vmax:           101.00 cm/s  PV Peak grad:  1.3 mmHg AV Vmean:          76.300 cm/s  PV Mean grad:  1.0 mmHg AV VTI:            0.289 m AV Peak Grad:      4.1 mmHg AV Mean Grad:      3.0 mmHg LVOT Vmax:         86.20 cm/s LVOT Vmean:        57.900 cm/s LVOT VTI:          0.211 m LVOT/AV VTI ratio: 0.73  AORTA Ao Root diam: 3.40 cm Ao Asc diam:  3.50 cm MITRAL VALVE MV Area (PHT): 3.87 cm     SHUNTS MV Area VTI:   0.55 cm     Systemic VTI:  0.21 m MV Peak grad:  56.9 mmHg    Systemic Diam: 1.90 cm MV Mean grad:  40.0 mmHg    Pulmonic Diam: 2.60 cm MV Vmax:       3.77 m/s MV Vmean:       304.0 cm/s MV Decel Time: 196 msec MV E velocity: 107.00 cm/s MV A velocity: 113.00 cm/s MV E/A ratio:  0.95 Dalton McleanMD Electronically signed by Franki Monte Signature Date/Time: 02/15/2021/4:00:34 PM    Final      Assessment and Plan:   Acute on Chronic Diastolic Heart Failure  - Echo this admission showed LVEF 55-60% (stable since 05/2020), no regional wall motion  abnormalities, mild LVH, grade I diastolic dysfunction - BMP only mildly elevated (187), but CXR showed pulmonary vascular congestion and bilateral pleural effusion  - Has been diuresing on IV lasix 43m BID. Currently net -1.3 L fluids since admission. Continues to have signs volume overload on exam  - Weight 189.7 lbs, dry weight appears to be between 145-150 lbs  - Strict I's/Os, daily weights  - Spironolactone added by primary team  - With diabetes history, could possibly benefit from SGLT2i prior to discharge  - Will add low dose losartan   Bradycardia  - Documented vital signs showed HR in 50s-70s, telemetry shows mostly 50s with occasional drops to the 40s   - Asymptomatic, not on any AV nodal blocking medications. Will avoid any rate lowering medications     HTN  - BP has been elevated while inpatient  - Continue IV lasix, oral aldactone  - Add low dose losartan   Paroxysmal Atrial Fibrillation  - On eliquis 5 mg BID  - Heart rate is well controlled, not on any rate controlling medications   CAD s/p CABG  - Patient has not been seen by a cardiologist in years  - On ASA, statin  - Will update lipid panel this admission   Otherwise managed per primary  - Persistent diarrhea  - Anemia  - Type 2 DM     Risk Assessment/Risk Scores:   New York Heart Association (NYHA) Functional Class NYHA Class II  CHA2DS2-VASc Score = 5  This indicates a 7.2% annual risk of stroke. The patient's score is based upon: CHF History: 1 HTN History: 1 Diabetes History: 1 Stroke History: 0 Vascular Disease  History: 1 Age Score: 1 Gender Score: 0      For questions or updates, please contact [facility] Please consult [email or web address] for contact info under    Signed, KMargie Billet PA-C  02/15/2021 5:13 PM   Patient seen and examined, note reviewed with the signed Advanced Practice Provider. I personally reviewed laboratory data, imaging studies and relevant notes. I independently examined the patient and formulated the important aspects of the plan. I have personally discussed the plan with the patient and/or family. Comments or changes to the note/plan are indicated below.  Agree with diuretics keep Lasix 40 mg twice daily he appears to be responding well.  Continue strict I/O's. Start losartan 25 mg daily. We will benefit from optimization of cardiac mass especially in the setting of diabetes SGL 2 inhibitors will be beneficial. Avoid rate lowering agents at this time patient with atrial fibrillation with ventricular rate. Continue aspirin and statin.  Follow-up lipid profile. We will continue to follow with you   KBerniece SalinesDO, MS FEndoscopy Center Of DelawareAttending Cardiologist CKetchum [address] [place] [phone]: [email or web address]

## 2021-02-16 ENCOUNTER — Encounter (HOSPITAL_BASED_OUTPATIENT_CLINIC_OR_DEPARTMENT_OTHER): Payer: HMO | Admitting: Internal Medicine

## 2021-02-16 DIAGNOSIS — I2510 Atherosclerotic heart disease of native coronary artery without angina pectoris: Secondary | ICD-10-CM

## 2021-02-16 DIAGNOSIS — I5033 Acute on chronic diastolic (congestive) heart failure: Secondary | ICD-10-CM | POA: Diagnosis not present

## 2021-02-16 DIAGNOSIS — K529 Noninfective gastroenteritis and colitis, unspecified: Secondary | ICD-10-CM | POA: Diagnosis present

## 2021-02-16 DIAGNOSIS — E109 Type 1 diabetes mellitus without complications: Secondary | ICD-10-CM

## 2021-02-16 LAB — COMPREHENSIVE METABOLIC PANEL
ALT: 13 U/L (ref 0–44)
AST: 16 U/L (ref 15–41)
Albumin: 2.8 g/dL — ABNORMAL LOW (ref 3.5–5.0)
Alkaline Phosphatase: 55 U/L (ref 38–126)
Anion gap: 9 (ref 5–15)
BUN: 19 mg/dL (ref 8–23)
CO2: 29 mmol/L (ref 22–32)
Calcium: 8.7 mg/dL — ABNORMAL LOW (ref 8.9–10.3)
Chloride: 100 mmol/L (ref 98–111)
Creatinine, Ser: 0.77 mg/dL (ref 0.61–1.24)
GFR, Estimated: >60 mL/min (ref >60)
Glucose, Bld: 67 mg/dL — ABNORMAL LOW (ref 70–99)
Potassium: 4.1 mmol/L (ref 3.5–5.1)
Sodium: 138 mmol/L (ref 135–145)
Total Bilirubin: 0.8 mg/dL (ref 0.3–1.2)
Total Protein: 5.6 g/dL — ABNORMAL LOW (ref 6.5–8.1)

## 2021-02-16 LAB — LIPID PANEL
Cholesterol: 78 mg/dL (ref 0–200)
HDL: 39 mg/dL — ABNORMAL LOW (ref >40)
LDL Cholesterol: 34 mg/dL (ref 0–99)
Total CHOL/HDL Ratio: 2 RATIO
Triglycerides: 26 mg/dL (ref <150)
VLDL: 5 mg/dL (ref 0–40)

## 2021-02-16 LAB — GLUCOSE, CAPILLARY
Glucose-Capillary: 77 mg/dL (ref 70–99)
Glucose-Capillary: 145 mg/dL — ABNORMAL HIGH (ref 70–99)
Glucose-Capillary: 117 mg/dL — ABNORMAL HIGH (ref 70–99)
Glucose-Capillary: 211 mg/dL — ABNORMAL HIGH (ref 70–99)

## 2021-02-16 LAB — RETICULOCYTES
Immature Retic Fract: 10.5 % (ref 2.3–15.9)
RBC.: 3.49 MIL/uL — ABNORMAL LOW (ref 4.22–5.81)
Retic Count, Absolute: 76.4 10*3/uL (ref 19.0–186.0)
Retic Ct Pct: 2.2 % (ref 0.4–3.1)

## 2021-02-16 LAB — CBC
HCT: 33.5 % — ABNORMAL LOW (ref 39.0–52.0)
Hemoglobin: 10.8 g/dL — ABNORMAL LOW (ref 13.0–17.0)
MCH: 30.4 pg (ref 26.0–34.0)
MCHC: 32.2 g/dL (ref 30.0–36.0)
MCV: 94.4 fL (ref 80.0–100.0)
Platelets: 159 10*3/uL (ref 150–400)
RBC: 3.55 MIL/uL — ABNORMAL LOW (ref 4.22–5.81)
RDW: 14 % (ref 11.5–15.5)
WBC: 5.9 10*3/uL (ref 4.0–10.5)
nRBC: 0 % (ref 0.0–0.2)

## 2021-02-16 LAB — IRON AND TIBC
Iron: 25 ug/dL — ABNORMAL LOW (ref 45–182)
Saturation Ratios: 9 % — ABNORMAL LOW (ref 17.9–39.5)
TIBC: 294 ug/dL (ref 250–450)
UIBC: 269 ug/dL

## 2021-02-16 LAB — FOLATE: Folate: 19.3 ng/mL (ref >5.9)

## 2021-02-16 LAB — VITAMIN B12: Vitamin B-12: 310 pg/mL (ref 180–914)

## 2021-02-16 LAB — FERRITIN: Ferritin: 184 ng/mL (ref 24–336)

## 2021-02-16 MED ORDER — LORAZEPAM 0.5 MG PO TABS
0.5000 mg | ORAL_TABLET | Freq: Four times a day (QID) | ORAL | Status: DC | PRN
  Administered 2021-02-16: 0.5 mg via ORAL
  Filled 2021-02-16: qty 1

## 2021-02-16 MED ORDER — ASPIRIN EC 81 MG PO TBEC
81.0000 mg | DELAYED_RELEASE_TABLET | Freq: Every day | ORAL | Status: DC
  Administered 2021-02-17 – 2021-02-18 (×2): 81 mg via ORAL
  Filled 2021-02-16 (×2): qty 1

## 2021-02-16 NOTE — Plan of Care (Signed)
  Problem: Safety: Goal: Ability to remain free from injury will improve Outcome: Progressing   

## 2021-02-16 NOTE — Assessment & Plan Note (Addendum)
Could be related to malabsorption - improved with Imodium -Recommend gastroenterology follow-up especially in the setting of iron deficiency anemia, need to rule out celiac sprue

## 2021-02-16 NOTE — Progress Notes (Signed)
Progress Note  Patient Name: [name] [name] Date of Encounter: 02/16/2021  Primary Cardiologist: Evalina Field, MD   Subjective   Patient was seen examined his bedside.  He was awake in bed when I arrived.  No complaints.  Inpatient Medications    Scheduled Meds:  apixaban  5 mg Oral BID   [START ON 02/17/2021] aspirin EC  81 mg Oral Daily   atorvastatin  40 mg Oral Daily   cholecalciferol  5,000 Units Oral Daily   furosemide  40 mg Intravenous BID   insulin aspart  0-15 Units Subcutaneous TID AC & HS   insulin aspart  3 Units Subcutaneous TID WC   insulin glargine-yfgn  10 Units Subcutaneous Daily   losartan  25 mg Oral Daily   PARoxetine  20 mg Oral Daily   spironolactone  25 mg Oral Daily   Continuous Infusions:  PRN Meds: hydrALAZINE, loperamide, LORazepam   Vital Signs    Vitals:   02/15/21 2259 02/16/21 0421 02/16/21 1000 02/16/21 1005  BP: 111/78 119/70  (!) 148/87  Pulse: (!) 46   64  Resp: 17 18 14 20   Temp: 98.5 F (36.9 C) 98.5 F (36.9 C)  98.7 F (37.1 C)  TempSrc: Oral Oral    SpO2: 94% 99%  96%  Weight:  77.1 kg    Height:        Intake/Output Summary (Last 24 hours) at 02/16/2021 1120 Last data filed at 02/16/2021 0905 Gross per 24 hour  Intake 1260 ml  Output 2200 ml  Net -940 ml   Filed Weights   02/14/21 1912 02/15/21 0405 02/16/21 0421  Weight: 86.1 kg 86 kg 77.1 kg    Telemetry    Sinus rhythm- Personally Reviewed  ECG    None  - Personally Reviewed  Physical Exam     General: Comfortable Head: Atraumatic, normal size  Eyes: PEERLA, EOMI  Neck: Supple, normal JVD Cardiac: Normal S1, S2; RRR; no murmurs, rubs, or gallops Lungs: Clear to auscultation bilaterally Abd: Soft, nontender, no hepatomegaly  Ext: warm, 2+ bilateral leg edema Musculoskeletal: No deformities, BUE and BLE strength normal and equal Skin: Warm and dry, no rashes   Neuro: Alert and oriented to person, place, time, and situation, CNII-XII  grossly intact, no focal deficits  Psych: Normal mood and affect   Labs    Chemistry Recent Labs  Lab 02/14/21 0926 02/15/21 0414 02/16/21 0434  NA 140 140 138  K 4.0 4.2 4.1  CL 104 104 100  CO2 32 27 29  GLUCOSE 132* 186* 67*  BUN 15 14 19   CREATININE 0.63 0.65 0.77  CALCIUM 8.6* 8.5* 8.7*  PROT  --   --  5.6*  ALBUMIN  --   --  2.8*  AST  --   --  16  ALT  --   --  13  ALKPHOS  --   --  55  BILITOT  --   --  0.8  GFRNONAA >60 >60 >60  ANIONGAP 4* 9 9     Hematology Recent Labs  Lab 02/14/21 0926 02/15/21 0414 02/16/21 0434  WBC 4.5 5.1 5.9  RBC 3.54* 3.63* 3.55*   3.49*  HGB 10.9* 11.4* 10.8*  HCT 34.1* 34.1* 33.5*  MCV 96.3 93.9 94.4  MCH 30.8 31.4 30.4  MCHC 32.0 33.4 32.2  RDW 14.3 14.3 14.0  PLT 178 164 159    Cardiac EnzymesNo results for input(s): TROPONINI in the last 168 hours. No  results for input(s): TROPIPOC in the last 168 hours.   BNP Recent Labs  Lab 02/14/21 0926  BNP 187.4*     DDimer No results for input(s): DDIMER in the last 168 hours.   Radiology    ECHOCARDIOGRAM COMPLETE  Result Date: 02/15/2021    ECHOCARDIOGRAM REPORT   Patient Name:   [name] [name] Date of Exam: 02/15/2021 Medical Rec #:  [id number]            Height:       74.0 in Accession #:    [id number]           Weight:       189.7 lb Date of Birth:  13-Dec-1955             BSA:          2.125 m Patient Age:    66 years             BP:           136/71 mmHg Patient Gender: M                    HR:           49 bpm. Exam Location:  Inpatient Procedure: 2D Echo, Cardiac Doppler and Color Doppler Indications:    I50.21 CHF  History:        Patient has prior history of Echocardiogram examinations, most                 recent 05/17/2020. Risk Factors:Diabetes, Dyslipidemia,                 Hypertension and Family History of Coronary Artery Disease. CAD.  Sonographer:    Beryle Beams Referring Phys: [id number] [employer] T TU IMPRESSIONS  1. Left ventricular ejection fraction, by  estimation, is 55 to 60%. The left ventricle has normal function. The left ventricle has no regional wall motion abnormalities. There is mild left ventricular hypertrophy. Left ventricular diastolic parameters are consistent with Grade I diastolic dysfunction (impaired relaxation).  2. Right ventricular systolic function is mildly reduced. The right ventricular size is normal.  3. Left atrial size was mildly dilated.  4. The mitral valve is normal in structure. Mild mitral valve regurgitation. No evidence of mitral stenosis.  5. The aortic valve is tricuspid. Aortic valve regurgitation is not visualized. Aortic valve is calcified. In the parasternal long axis, cannot rule out some mobility with question of small vegetation. Not noted in other views. Clinical correlation suggested.  6. The inferior vena cava is dilated in size with >50% respiratory variability, suggesting right atrial pressure of 8 mmHg.  7. Left pleural effusion noted. FINDINGS  Left Ventricle: Left ventricular ejection fraction, by estimation, is 55 to 60%. The left ventricle has normal function. The left ventricle has no regional wall motion abnormalities. The left ventricular internal cavity size was normal in size. There is  mild left ventricular hypertrophy. Left ventricular diastolic parameters are consistent with Grade I diastolic dysfunction (impaired relaxation). Right Ventricle: The right ventricular size is normal. No increase in right ventricular wall thickness. Right ventricular systolic function is mildly reduced. Left Atrium: Left atrial size was mildly dilated. Right Atrium: Right atrial size was normal in size. Pericardium: Left pleural effusion noted. There is no evidence of pericardial effusion. Mitral Valve: The mitral valve is normal in structure. There is mild calcification of the mitral valve leaflet(s). Mild mitral valve regurgitation. No evidence of mitral valve stenosis.  MV peak gradient, 56.9 mmHg. The mean mitral valve  gradient is 40.0 mmHg. Tricuspid Valve: The tricuspid valve is normal in structure. Tricuspid valve regurgitation is not demonstrated. Aortic Valve: The aortic valve is tricuspid. Aortic valve regurgitation is not visualized. Aortic valve sclerosis/calcification is present, without any evidence of aortic stenosis. Aortic valve mean gradient measures 3.0 mmHg. Aortic valve peak gradient measures 4.1 mmHg. Aortic valve area, by VTI measures 2.07 cm. Pulmonic Valve: The pulmonic valve was normal in structure. Pulmonic valve regurgitation is not visualized. Aorta: The aortic root is normal in size and structure. Venous: The inferior vena cava is dilated in size with greater than 50% respiratory variability, suggesting right atrial pressure of 8 mmHg. IAS/Shunts: No atrial level shunt detected by color flow Doppler.  LEFT VENTRICLE PLAX 2D LVIDd:         4.20 cm LVIDs:         2.70 cm LV PW:         1.20 cm LV IVS:        1.20 cm LVOT diam:     1.90 cm LV SV:         60 LV SV Index:   28 LVOT Area:     2.84 cm  LV Volumes (MOD) LV vol d, MOD A2C: 111.0 ml LV vol d, MOD A4C: 91.2 ml LV vol s, MOD A2C: 49.5 ml LV vol s, MOD A4C: 37.8 ml LV SV MOD A2C:     61.5 ml LV SV MOD A4C:     91.2 ml LV SV MOD BP:      57.3 ml RIGHT VENTRICLE            IVC RV S prime:     7.83 cm/s  IVC diam: 2.30 cm RVOT diam:      2.60 cm TAPSE (M-mode): 1.0 cm LEFT ATRIUM             Index        RIGHT ATRIUM          Index LA diam:        4.40 cm 2.07 cm/m   RA Area:     9.24 cm LA Vol (A2C):   84.6 ml 39.81 ml/m  RA Volume:   16.40 ml 7.72 ml/m LA Vol (A4C):   68.0 ml 32.00 ml/m LA Biplane Vol: 78.7 ml 37.03 ml/m  AORTIC VALVE                    PULMONIC VALVE AV Area (Vmax):    2.42 cm     PV Vmax:       0.57 m/s AV Area (Vmean):   2.15 cm     PV Vmean:      42.400 cm/s AV Area (VTI):     2.07 cm     PV VTI:        0.141 m AV Vmax:           101.00 cm/s  PV Peak grad:  1.3 mmHg AV Vmean:          76.300 cm/s  PV Mean grad:  1.0 mmHg  AV VTI:            0.289 m AV Peak Grad:      4.1 mmHg AV Mean Grad:      3.0 mmHg LVOT Vmax:         86.20 cm/s LVOT Vmean:        57.900 cm/s  LVOT VTI:          0.211 m LVOT/AV VTI ratio: 0.73  AORTA Ao Root diam: 3.40 cm Ao Asc diam:  3.50 cm MITRAL VALVE MV Area (PHT): 3.87 cm     SHUNTS MV Area VTI:   0.55 cm     Systemic VTI:  0.21 m MV Peak grad:  56.9 mmHg    Systemic Diam: 1.90 cm MV Mean grad:  40.0 mmHg    Pulmonic Diam: 2.60 cm MV Vmax:       3.77 m/s MV Vmean:      304.0 cm/s MV Decel Time: 196 msec MV E velocity: 107.00 cm/s MV A velocity: 113.00 cm/s MV E/A ratio:  0.95 Dalton McleanMD Electronically signed by Franki Monte Signature Date/Time: 02/15/2021/4:00:34 PM    Final     Cardiac Studies   Echo 02/15/21   1. Left ventricular ejection fraction, by estimation, is 55 to 60%. The  left ventricle has normal function. The left ventricle has no regional  wall motion abnormalities. There is mild left ventricular hypertrophy.  Left ventricular diastolic parameters  are consistent with Grade I diastolic dysfunction (impaired relaxation).   2. Right ventricular systolic function is mildly reduced. The right ventricular size is normal.   3. Left atrial size was mildly dilated.   4. The mitral valve is normal in structure. Mild mitral valve  regurgitation. No evidence of mitral stenosis.   5. The aortic valve is tricuspid. Aortic valve regurgitation is not  visualized. Aortic valve is calcified. In the parasternal long axis,  cannot rule out some mobility with question of small vegetation. Not noted  in other views. Clinical correlation  suggested.   6. The inferior vena cava is dilated in size with >50% respiratory  variability, suggesting right atrial pressure of 8 mmHg.   7. Left pleural effusion noted.   Patient Profile     66 y.o. male with history of coronary artery disease status post CABG x3 back in 2003, PVD, paroxysmal atrial fibrillation on Eliquis, type 2 diabetes on  insulin pump and hypertension with history of aortic aneurysm repair in 2003 as well  Assessment & Plan    Acute on chronic diastolic heart failure Persistent atrial fibrillation-now in sinus rhythm Hypertension Coronary artery disease status post CABG  Overnight output 940 mL-not sure if this is accurate but will continue to maintain current doses of IV Lasix.  I think he could benefit for another 24 hours of IV Lasix prior to transitioning to p.o.  Thankfully he has converted back to sinus rhythm.  We will continue to monitor the patient. Continue his Eliquis for his A-fib. Coronary artery disease-no anginal symptoms. He was started yesterday on losartan we will continue to monitor his blood pressure.  Target be less than 130/80 mmHg may need to titrate up losartan if needed.  I discussed with the patient his bedside.  For questions or updates, please contact [place] Please consult [email or web address] for contact info under Cardiology/STEMI.      Signed, Mayerly Kaman, DO  02/16/2021, 11:20 AM

## 2021-02-16 NOTE — Assessment & Plan Note (Signed)
Anemia panel with iron deficiency, will add IV iron

## 2021-02-16 NOTE — Progress Notes (Signed)
°   02/16/21 1005  Assess: MEWS Score  Temp 98.7 F (37.1 C)  BP (!) 148/87  Pulse Rate 64  ECG Heart Rate 62  Resp 20  SpO2 96 %  Assess: MEWS Score  MEWS Temp 0  MEWS Systolic 0  MEWS Pulse 0  MEWS RR 0  MEWS LOC 0  MEWS Score 0  MEWS Score Color Green  Assess: if the MEWS score is Yellow or Red  Were vital signs taken at a resting state? Yes  Focused Assessment Change from prior assessment (see assessment flowsheet)  Early Detection of Sepsis Score *See Row Information* Low  MEWS guidelines implemented *See Row Information* No, vital signs rechecked  Treat  MEWS Interventions Administered scheduled meds/treatments  Pain Scale 0-10  Pain Score 0  Pain Location Leg  Pain Orientation Right;Left  Notify: Charge Nurse/RN  Name of Charge Nurse/RN Notified Bonney Roussel  Date Charge Nurse/RN Notified 02/16/21  Time Charge Nurse/RN Notified 0407  Document  Patient Outcome Stabilized after interventions  Progress note created (see row info) Yes     02/16/21 1005  Assess: MEWS Score  Temp 98.7 F (37.1 C)  BP (!) 148/87  Pulse Rate 64  ECG Heart Rate 62  Resp 20  SpO2 96 %  Assess: MEWS Score  MEWS Temp 0  MEWS Systolic 0  MEWS Pulse 0  MEWS RR 0  MEWS LOC 0  MEWS Score 0  MEWS Score Color Green  Assess: if the MEWS score is Yellow or Red  Were vital signs taken at a resting state? Yes  Focused Assessment Change from prior assessment (see assessment flowsheet)  Early Detection of Sepsis Score *See Row Information* Low  MEWS guidelines implemented *See Row Information* No, vital signs rechecked  Treat  MEWS Interventions Administered scheduled meds/treatments  Pain Scale 0-10  Pain Score 0  Pain Location Leg  Pain Orientation Right;Left  Notify: Charge Nurse/RN  Name of Charge Nurse/RN Notified Bonney Roussel  Date Charge Nurse/RN Notified 02/16/21  Time Charge Nurse/RN Notified 0407  Document  Patient Outcome Stabilized after interventions  Progress note created  (see row info) Yes   Patient HR better while awake.

## 2021-02-16 NOTE — Assessment & Plan Note (Signed)
CAD, CABG in 2003 -Has not followed up with cardiology in many years -Continue aspirin and statin, echo with preserved EF

## 2021-02-16 NOTE — Progress Notes (Signed)
Inpatient Diabetes Program Recommendations  AACE/ADA: New Consensus Statement on Inpatient Glycemic Control (2015)  Target Ranges:  Prepandial:   less than 140 mg/dL      Peak postprandial:   less than 180 mg/dL (1-2 hours)      Critically ill patients:  140 - 180 mg/dL   Lab Results  Component Value Date   GLUCAP 145 (H) 02/16/2021   HGBA1C 7.1 (A) 01/12/2021    Review of Glycemic Control  Latest Reference Range & Units 02/15/21 11:27 02/15/21 16:34 02/15/21 21:20 02/16/21 06:26 02/16/21 11:15  Glucose-Capillary 70 - 99 mg/dL 258 (H) 225 (H) 80 77 145 (H)   Diabetes history: DM 1 Outpatient Diabetes medications:  Omnipod- 0.7 units/hr (total basal=16.8 units/24 hours) with 4 units bid for meals Current orders for Inpatient glycemic control:  Novolog moderate tid with meals and HS Novolog 3 units tid with meals Semglee 10 units daily Inpatient Diabetes Program Recommendations:   Please consider reducing Novolog correction to sensitive tid with meals and HS scale.    Thanks,  Adah Perl, RN, BC-ADM Inpatient Diabetes Coordinator Pager [phone]  (8a-5p)

## 2021-02-16 NOTE — Progress Notes (Signed)
PROGRESS NOTE    [name] [name]  PYY:[id number] DOB: [birth year] DOA: 02/14/2021 PCP: Fredirick Lathe, PA-C  Brief Narrative:66/M with history of CAD/CABG in 2003, aortic dissection repair, peripheral vascular disease, paroxysmal A-fib on Eliquis, type 1 diabetes on insulin pump, hypertension presented to the ED with progressive shortness of breath orthopnea and edema X few weeks. -Also ongoing diarrhea for several months -In the ED BP was 180/103, BNP 187, chest x-ray noted new moderate left and small right pleural effusion, pulmonary vascular congestion and interstitial thickening c/w CHF   Subjective: -Feels better overall, breathing is improving, not back to baseline yet  Assessment & Plan: * Acute exacerbation of CHF (congestive heart failure) (HCC) Acute diastolic CHF -Echo with preserved EF, cannot rule out aortic valve vegetation, clinically do not suspect endocarditis -Cardiology following, continue IV Lasix today -He is -1.6 L -Started on Aldactone and ARB as well  Chronic diarrhea- (present on admission) Could be related to malabsorption -Monitor, improved with Imodium -Recommend gastroenterology follow-up especially in the setting of iron deficiency anemia, need to rule out celiac sprue  Type 1 diabetes mellitus (HCC) Insulin pump on hold, continue Semglee, meal coverage insulin, CBGs are stable  Paroxysmal atrial fibrillation (HCC)- (present on admission) Continue Eliquis, heart rate controlled  Normocytic anemia Anemia panel with iron deficiency, will add IV iron  S/P CABG (coronary artery bypass graft) CAD, CABG in 2003 -Has not followed up with cardiology in many years -Continue aspirin and statin, echo with preserved EF  Hypertension- (present on admission) BP stable, continue IV Lasix, now on Aldactone and losartan too   DVT prophylaxis: Apixaban Code Status: Full code Family Communication: Discussed with patient in detail, no family at  bedside Disposition Plan:   Consultants:  Cardiology  Procedures:   Antimicrobials:    Objective: Vitals:   02/15/21 2259 02/16/21 0421 02/16/21 1000 02/16/21 1005  BP: 111/78 119/70  (!) 148/87  Pulse: (!) 46   64  Resp: 17 18 14 20   Temp: 98.5 F (36.9 C) 98.5 F (36.9 C)  98.7 F (37.1 C)  TempSrc: Oral Oral    SpO2: 94% 99%  96%  Weight:  77.1 kg    Height:        Intake/Output Summary (Last 24 hours) at 02/16/2021 1421 Last data filed at 02/16/2021 3567 Gross per 24 hour  Intake 660 ml  Output 1700 ml  Net -1040 ml   Filed Weights   02/14/21 1912 02/15/21 0405 02/16/21 0421  Weight: 86.1 kg 86 kg 77.1 kg    Examination:  General exam: Pleasant male sitting up in bed, AAOx3, no distress HEENT: Positive JVD CVS: S1-S2, regular rate rhythm Lungs: Few basilar rales Abdomen: Soft, nontender, bowel sounds present Extremities: 1+ edema  Skin: No rashes Psychiatry:  Mood & affect appropriate.     Data Reviewed:   CBC: Recent Labs  Lab 02/14/21 0926 02/15/21 0414 02/16/21 0434  WBC 4.5 5.1 5.9  HGB 10.9* 11.4* 10.8*  HCT 34.1* 34.1* 33.5*  MCV 96.3 93.9 94.4  PLT 178 164 014   Basic Metabolic Panel: Recent Labs  Lab 02/14/21 0926 02/15/21 0414 02/16/21 0434  NA 140 140 138  K 4.0 4.2 4.1  CL 104 104 100  CO2 32 27 29  GLUCOSE 132* 186* 67*  BUN 15 14 19   CREATININE 0.63 0.65 0.77  CALCIUM 8.6* 8.5* 8.7*   GFR: Estimated Creatinine Clearance: 96.4 mL/min (by C-G formula based on SCr of 0.77 mg/dL). Liver Function  Tests: Recent Labs  Lab 02/16/21 0434  AST 16  ALT 13  ALKPHOS 55  BILITOT 0.8  PROT 5.6*  ALBUMIN 2.8*   No results for input(s): LIPASE, AMYLASE in the last 168 hours. No results for input(s): AMMONIA in the last 168 hours. Coagulation Profile: No results for input(s): INR, PROTIME in the last 168 hours. Cardiac Enzymes: No results for input(s): CKTOTAL, CKMB, CKMBINDEX, TROPONINI in the last 168 hours. BNP (last  3 results) No results for input(s): PROBNP in the last 8760 hours. HbA1C: No results for input(s): HGBA1C in the last 72 hours. CBG: Recent Labs  Lab 02/15/21 1127 02/15/21 1634 02/15/21 2120 02/16/21 0626 02/16/21 1115  GLUCAP 258* 225* 80 77 145*   Lipid Profile: Recent Labs    02/16/21 0434  CHOL 78  HDL 39*  LDLCALC 34  TRIG 26  CHOLHDL 2.0   Thyroid Function Tests: No results for input(s): TSH, T4TOTAL, FREET4, T3FREE, THYROIDAB in the last 72 hours. Anemia Panel: Recent Labs    02/16/21 0434  VITAMINB12 310  FOLATE 19.3  FERRITIN 184  TIBC 294  IRON 25*  RETICCTPCT 2.2   Urine analysis:    Component Value Date/Time   COLORURINE YELLOW 02/15/2021 1625   APPEARANCEUR CLEAR 02/15/2021 1625   LABSPEC 1.008 02/15/2021 1625   PHURINE 5.0 02/15/2021 1625   GLUCOSEU NEGATIVE 02/15/2021 1625   GLUCOSEU >=1000 (A) 03/16/2020 0958   HGBUR SMALL (A) 02/15/2021 1625   BILIRUBINUR NEGATIVE 02/15/2021 1625   KETONESUR NEGATIVE 02/15/2021 1625   PROTEINUR 30 (A) 02/15/2021 1625   UROBILINOGEN 0.2 03/16/2020 0958   NITRITE NEGATIVE 02/15/2021 1625   LEUKOCYTESUR NEGATIVE 02/15/2021 1625   Sepsis Labs: @LABRCNTIP (procalcitonin:4,lacticidven:4)  ) Recent Results (from the past 240 hour(s))  Resp Panel by RT-PCR (Flu A&B, Covid) Nasopharyngeal Swab     Status: None   Collection Time: 02/14/21 11:15 AM   Specimen: Nasopharyngeal Swab; Nasopharyngeal(NP) swabs in vial transport medium  Result Value Ref Range Status   SARS Coronavirus 2 by RT PCR NEGATIVE NEGATIVE Final    Comment: (NOTE) SARS-CoV-2 target nucleic acids are NOT DETECTED.  The SARS-CoV-2 RNA is generally detectable in upper respiratory specimens during the acute phase of infection. The lowest concentration of SARS-CoV-2 viral copies this assay can detect is 138 copies/mL. A negative result does not preclude SARS-Cov-2 infection and should not be used as the sole basis for treatment or other  patient management decisions. A negative result may occur with  improper specimen collection/handling, submission of specimen other than nasopharyngeal swab, presence of viral mutation(s) within the areas targeted by this assay, and inadequate number of viral copies(<138 copies/mL). A negative result must be combined with clinical observations, patient history, and epidemiological information. The expected result is Negative.  Fact Sheet for Patients:  EntrepreneurPulse.com.au  Fact Sheet for Healthcare Providers:  IncredibleEmployment.be  This test is no t yet approved or cleared by the [place] FDA and  has been authorized for detection and/or diagnosis of SARS-CoV-2 by FDA under an Emergency Use Authorization (EUA). This EUA will remain  in effect (meaning this test can be used) for the duration of the COVID-19 declaration under Section 564(b)(1) of the Act, 21 U.S.C.section 360bbb-3(b)(1), unless the authorization is terminated  or revoked sooner.       Influenza A by PCR NEGATIVE NEGATIVE Final   Influenza B by PCR NEGATIVE NEGATIVE Final    Comment: (NOTE) The Xpert Xpress SARS-CoV-2/FLU/RSV plus assay is intended as an aid in  the diagnosis of influenza from Nasopharyngeal swab specimens and should not be used as a sole basis for treatment. Nasal washings and aspirates are unacceptable for Xpert Xpress SARS-CoV-2/FLU/RSV testing.  Fact Sheet for Patients: EntrepreneurPulse.com.au  Fact Sheet for Healthcare Providers: IncredibleEmployment.be  This test is not yet approved or cleared by the [place] FDA and has been authorized for detection and/or diagnosis of SARS-CoV-2 by FDA under an Emergency Use Authorization (EUA). This EUA will remain in effect (meaning this test can be used) for the duration of the COVID-19 declaration under Section 564(b)(1) of the Act, 21 U.S.C. section  360bbb-3(b)(1), unless the authorization is terminated or revoked.  Performed at [facility], [address], [place], [place] [address]      Radiology Studies: ECHOCARDIOGRAM COMPLETE  Result Date: 02/15/2021    ECHOCARDIOGRAM REPORT   Patient Name:   [name] [name] Date of Exam: 02/15/2021 Medical Rec #:  [id number]            Height:       74.0 in Accession #:    [id number]           Weight:       189.7 lb Date of Birth:  [birth year]             BSA:          2.125 m Patient Age:    26 years             BP:           136/71 mmHg Patient Gender: M                    HR:           49 bpm. Exam Location:  Inpatient Procedure: 2D Echo, Cardiac Doppler and Color Doppler Indications:    I50.21 CHF  History:        Patient has prior history of Echocardiogram examinations, most                 recent 05/17/2020. Risk Factors:Diabetes, Dyslipidemia,                 Hypertension and Family History of Coronary Artery Disease. CAD.  Sonographer:    Beryle Beams Referring Phys: [id number] [place] T TU IMPRESSIONS  1. Left ventricular ejection fraction, by estimation, is 55 to 60%. The left ventricle has normal function. The left ventricle has no regional wall motion abnormalities. There is mild left ventricular hypertrophy. Left ventricular diastolic parameters are consistent with Grade I diastolic dysfunction (impaired relaxation).  2. Right ventricular systolic function is mildly reduced. The right ventricular size is normal.  3. Left atrial size was mildly dilated.  4. The mitral valve is normal in structure. Mild mitral valve regurgitation. No evidence of mitral stenosis.  5. The aortic valve is tricuspid. Aortic valve regurgitation is not visualized. Aortic valve is calcified. In the parasternal long axis, cannot rule out some mobility with question of small vegetation. Not noted in other views. Clinical correlation suggested.  6. The inferior vena cava is dilated in size with >50%  respiratory variability, suggesting right atrial pressure of 8 mmHg.  7. Left pleural effusion noted. FINDINGS  Left Ventricle: Left ventricular ejection fraction, by estimation, is 55 to 60%. The left ventricle has normal function. The left ventricle has no regional wall motion abnormalities. The left ventricular internal cavity size was normal in size. There is  mild left ventricular hypertrophy. Left ventricular  diastolic parameters are consistent with Grade I diastolic dysfunction (impaired relaxation). Right Ventricle: The right ventricular size is normal. No increase in right ventricular wall thickness. Right ventricular systolic function is mildly reduced. Left Atrium: Left atrial size was mildly dilated. Right Atrium: Right atrial size was normal in size. Pericardium: Left pleural effusion noted. There is no evidence of pericardial effusion. Mitral Valve: The mitral valve is normal in structure. There is mild calcification of the mitral valve leaflet(s). Mild mitral valve regurgitation. No evidence of mitral valve stenosis. MV peak gradient, 56.9 mmHg. The mean mitral valve gradient is 40.0 mmHg. Tricuspid Valve: The tricuspid valve is normal in structure. Tricuspid valve regurgitation is not demonstrated. Aortic Valve: The aortic valve is tricuspid. Aortic valve regurgitation is not visualized. Aortic valve sclerosis/calcification is present, without any evidence of aortic stenosis. Aortic valve mean gradient measures 3.0 mmHg. Aortic valve peak gradient measures 4.1 mmHg. Aortic valve area, by VTI measures 2.07 cm. Pulmonic Valve: The pulmonic valve was normal in structure. Pulmonic valve regurgitation is not visualized. Aorta: The aortic root is normal in size and structure. Venous: The inferior vena cava is dilated in size with greater than 50% respiratory variability, suggesting right atrial pressure of 8 mmHg. IAS/Shunts: No atrial level shunt detected by color flow Doppler.  LEFT VENTRICLE PLAX 2D  LVIDd:         4.20 cm LVIDs:         2.70 cm LV PW:         1.20 cm LV IVS:        1.20 cm LVOT diam:     1.90 cm LV SV:         60 LV SV Index:   28 LVOT Area:     2.84 cm  LV Volumes (MOD) LV vol d, MOD A2C: 111.0 ml LV vol d, MOD A4C: 91.2 ml LV vol s, MOD A2C: 49.5 ml LV vol s, MOD A4C: 37.8 ml LV SV MOD A2C:     61.5 ml LV SV MOD A4C:     91.2 ml LV SV MOD BP:      57.3 ml RIGHT VENTRICLE            IVC RV S prime:     7.83 cm/s  IVC diam: 2.30 cm RVOT diam:      2.60 cm TAPSE (M-mode): 1.0 cm LEFT ATRIUM             Index        RIGHT ATRIUM          Index LA diam:        4.40 cm 2.07 cm/m   RA Area:     9.24 cm LA Vol (A2C):   84.6 ml 39.81 ml/m  RA Volume:   16.40 ml 7.72 ml/m LA Vol (A4C):   68.0 ml 32.00 ml/m LA Biplane Vol: 78.7 ml 37.03 ml/m  AORTIC VALVE                    PULMONIC VALVE AV Area (Vmax):    2.42 cm     PV Vmax:       0.57 m/s AV Area (Vmean):   2.15 cm     PV Vmean:      42.400 cm/s AV Area (VTI):     2.07 cm     PV VTI:        0.141 m AV Vmax:  101.00 cm/s  PV Peak grad:  1.3 mmHg AV Vmean:          76.300 cm/s  PV Mean grad:  1.0 mmHg AV VTI:            0.289 m AV Peak Grad:      4.1 mmHg AV Mean Grad:      3.0 mmHg LVOT Vmax:         86.20 cm/s LVOT Vmean:        57.900 cm/s LVOT VTI:          0.211 m LVOT/AV VTI ratio: 0.73  AORTA Ao Root diam: 3.40 cm Ao Asc diam:  3.50 cm MITRAL VALVE MV Area (PHT): 3.87 cm     SHUNTS MV Area VTI:   0.55 cm     Systemic VTI:  0.21 m MV Peak grad:  56.9 mmHg    Systemic Diam: 1.90 cm MV Mean grad:  40.0 mmHg    Pulmonic Diam: 2.60 cm MV Vmax:       3.77 m/s MV Vmean:      304.0 cm/s MV Decel Time: 196 msec MV E velocity: 107.00 cm/s MV A velocity: 113.00 cm/s MV E/A ratio:  0.95 Dalton McleanMD Electronically signed by Franki Monte Signature Date/Time: 02/15/2021/4:00:34 PM    Final      Scheduled Meds:  apixaban  5 mg Oral BID   [START ON 02/17/2021] aspirin EC  81 mg Oral Daily   atorvastatin  40 mg Oral Daily    cholecalciferol  5,000 Units Oral Daily   furosemide  40 mg Intravenous BID   insulin aspart  0-15 Units Subcutaneous TID AC & HS   insulin glargine-yfgn  10 Units Subcutaneous Daily   losartan  25 mg Oral Daily   PARoxetine  20 mg Oral Daily   spironolactone  25 mg Oral Daily   Continuous Infusions:   LOS: 2 days    Time spent: 65min  Domenic Polite, MD [facility]   02/16/2021, 2:21 PM

## 2021-02-16 NOTE — Assessment & Plan Note (Signed)
Continue Eliquis, heart rate controlled

## 2021-02-16 NOTE — Assessment & Plan Note (Addendum)
Acute diastolic CHF -Echo with preserved EF, cannot rule out aortic valve vegetation, clinically do not suspect endocarditis -Cardiology following, urine output not accurate -Improving with diuresis, will transition to oral diuretics tomorrow -Started on Aldactone and ARB as well -Ambulate, discharge planning

## 2021-02-16 NOTE — Progress Notes (Signed)
Pt. Requesting medication for anxiety. On call for [facility] paged to make aware.

## 2021-02-16 NOTE — Assessment & Plan Note (Addendum)
BP stable, continue IV Lasix, now on Aldactone and losartan too

## 2021-02-16 NOTE — Assessment & Plan Note (Addendum)
Insulin pump on hold, continue Semglee, meal coverage insulin, CBGs are stable -Resume insulin pump at discharge

## 2021-02-17 DIAGNOSIS — I5033 Acute on chronic diastolic (congestive) heart failure: Secondary | ICD-10-CM | POA: Diagnosis not present

## 2021-02-17 LAB — BASIC METABOLIC PANEL
Anion gap: 8 (ref 5–15)
BUN: 22 mg/dL (ref 8–23)
CO2: 30 mmol/L (ref 22–32)
Calcium: 8.5 mg/dL — ABNORMAL LOW (ref 8.9–10.3)
Chloride: 100 mmol/L (ref 98–111)
Creatinine, Ser: 0.74 mg/dL (ref 0.61–1.24)
GFR, Estimated: >60 mL/min (ref >60)
Glucose, Bld: 76 mg/dL (ref 70–99)
Potassium: 4 mmol/L (ref 3.5–5.1)
Sodium: 138 mmol/L (ref 135–145)

## 2021-02-17 LAB — CBC
HCT: 34.1 % — ABNORMAL LOW (ref 39.0–52.0)
Hemoglobin: 10.9 g/dL — ABNORMAL LOW (ref 13.0–17.0)
MCH: 30.4 pg (ref 26.0–34.0)
MCHC: 32 g/dL (ref 30.0–36.0)
MCV: 95 fL (ref 80.0–100.0)
Platelets: 155 10*3/uL (ref 150–400)
RBC: 3.59 MIL/uL — ABNORMAL LOW (ref 4.22–5.81)
RDW: 13.8 % (ref 11.5–15.5)
WBC: 4.6 10*3/uL (ref 4.0–10.5)
nRBC: 0 % (ref 0.0–0.2)

## 2021-02-17 LAB — GLUCOSE, CAPILLARY
Glucose-Capillary: 59 mg/dL — ABNORMAL LOW (ref 70–99)
Glucose-Capillary: 102 mg/dL — ABNORMAL HIGH (ref 70–99)
Glucose-Capillary: 58 mg/dL — ABNORMAL LOW (ref 70–99)
Glucose-Capillary: 344 mg/dL — ABNORMAL HIGH (ref 70–99)
Glucose-Capillary: 317 mg/dL — ABNORMAL HIGH (ref 70–99)
Glucose-Capillary: 178 mg/dL — ABNORMAL HIGH (ref 70–99)

## 2021-02-17 MED ORDER — ACETAMINOPHEN 325 MG PO TABS
650.0000 mg | ORAL_TABLET | Freq: Four times a day (QID) | ORAL | Status: DC | PRN
  Administered 2021-02-17: 650 mg via ORAL
  Filled 2021-02-17: qty 2

## 2021-02-17 MED ORDER — FUROSEMIDE 10 MG/ML IJ SOLN
40.0000 mg | Freq: Two times a day (BID) | INTRAMUSCULAR | Status: AC
  Administered 2021-02-17: 40 mg via INTRAVENOUS
  Filled 2021-02-17: qty 4

## 2021-02-17 MED ORDER — INSULIN GLARGINE-YFGN 100 UNIT/ML ~~LOC~~ SOLN
5.0000 [IU] | Freq: Every day | SUBCUTANEOUS | Status: DC
  Filled 2021-02-17 (×3): qty 0.05

## 2021-02-17 MED ORDER — FUROSEMIDE 40 MG PO TABS
40.0000 mg | ORAL_TABLET | Freq: Every day | ORAL | Status: DC
  Administered 2021-02-18: 40 mg via ORAL
  Filled 2021-02-17: qty 1

## 2021-02-17 NOTE — Progress Notes (Signed)
Hypoglycemic Event  CBG: 58  Treatment: 8 oz juice/soda  Symptoms: None  Follow-up CBG: [id number] CBG Result:102  Possible Reasons for Event: Unknown  Comments/MD notified:MD not notified at this time hypoglycemia protocol followed. Patient denies complaint nursing staff to continue to monitor patient condition.    Felecia Jan

## 2021-02-17 NOTE — Progress Notes (Signed)
Progress Note  Patient Name: [name] [name] Date of Encounter: 02/17/2021  Primary Cardiologist: Evalina Field, MD   Subjective   Patient seen examined his bedside.  He was lying in bed when I arrived.  Breathing has improved significantly.  Inpatient Medications    Scheduled Meds:  apixaban  5 mg Oral BID   aspirin EC  81 mg Oral Daily   atorvastatin  40 mg Oral Daily   cholecalciferol  5,000 Units Oral Daily   furosemide  40 mg Intravenous BID   insulin aspart  0-15 Units Subcutaneous TID AC & HS   insulin glargine-yfgn  5 Units Subcutaneous Daily   losartan  25 mg Oral Daily   PARoxetine  20 mg Oral Daily   spironolactone  25 mg Oral Daily   Continuous Infusions:  PRN Meds: hydrALAZINE, loperamide, LORazepam   Vital Signs    Vitals:   02/16/21 1950 02/17/21 0412 02/17/21 0500 02/17/21 0749  BP: (!) 132/91 140/72  126/72  Pulse: (!) 53 (!) 45  60  Resp: 16 17  18   Temp: 98.8 F (37.1 C) 98.6 F (37 C)  98.8 F (37.1 C)  TempSrc: Oral Oral  Oral  SpO2: 98% 97%  97%  Weight:   76.9 kg   Height:        Intake/Output Summary (Last 24 hours) at 02/17/2021 1051 Last data filed at 02/17/2021 3086 Gross per 24 hour  Intake 1208 ml  Output --  Net 1208 ml   Filed Weights   02/15/21 0405 02/16/21 0421 02/17/21 0500  Weight: 86 kg 77.1 kg 76.9 kg    Telemetry    Sinus rhythm- Personally Reviewed  ECG    None today- Personally Reviewed  Physical Exam    General: Comfortable Head: Atraumatic, normal size  Eyes: PEERLA, EOMI  Neck: Supple, normal JVD Cardiac: Normal S1, S2; RRR; no murmurs, rubs, or gallops Lungs: Clear to auscultation bilaterally Abd: Soft, nontender, no hepatomegaly  Ext: warm, no edema Musculoskeletal: No deformities, BUE and BLE strength normal and equal Skin: Warm and dry, no rashes   Neuro: Alert and oriented to person, place, time, and situation, CNII-XII grossly intact, no focal deficits  Psych: Normal mood and  affect   Labs    Chemistry Recent Labs  Lab 02/15/21 0414 02/16/21 0434 02/17/21 0344  NA 140 138 138  K 4.2 4.1 4.0  CL 104 100 100  CO2 27 29 30   GLUCOSE 186* 67* 76  BUN 14 19 22   CREATININE 0.65 0.77 0.74  CALCIUM 8.5* 8.7* 8.5*  PROT  --  5.6*  --   ALBUMIN  --  2.8*  --   AST  --  16  --   ALT  --  13  --   ALKPHOS  --  55  --   BILITOT  --  0.8  --   GFRNONAA >60 >60 >60  ANIONGAP 9 9 8      Hematology Recent Labs  Lab 02/15/21 0414 02/16/21 0434 02/17/21 0344  WBC 5.1 5.9 4.6  RBC 3.63* 3.55*   3.49* 3.59*  HGB 11.4* 10.8* 10.9*  HCT 34.1* 33.5* 34.1*  MCV 93.9 94.4 95.0  MCH 31.4 30.4 30.4  MCHC 33.4 32.2 32.0  RDW 14.3 14.0 13.8  PLT 164 159 155    Cardiac EnzymesNo results for input(s): TROPONINI in the last 168 hours. No results for input(s): TROPIPOC in the last 168 hours.   BNP Recent Labs  Lab  02/14/21 0926  BNP 187.4*     DDimer No results for input(s): DDIMER in the last 168 hours.   Radiology    No results found.  Cardiac Studies   Echo 02/15/21   1. Left ventricular ejection fraction, by estimation, is 55 to 60%. The  left ventricle has normal function. The left ventricle has no regional  wall motion abnormalities. There is mild left ventricular hypertrophy.  Left ventricular diastolic parameters  are consistent with Grade I diastolic dysfunction (impaired relaxation).   2. Right ventricular systolic function is mildly reduced. The right ventricular size is normal.   3. Left atrial size was mildly dilated.   4. The mitral valve is normal in structure. Mild mitral valve  regurgitation. No evidence of mitral stenosis.   5. The aortic valve is tricuspid. Aortic valve regurgitation is not  visualized. Aortic valve is calcified. In the parasternal long axis,  cannot rule out some mobility with question of small vegetation. Not noted  in other views. Clinical correlation  suggested.   6. The inferior vena cava is dilated in size  with >50% respiratory  variability, suggesting right atrial pressure of 8 mmHg.   7. Left pleural effusion noted.   Patient Profile     66 y.o. male history of coronary artery disease status post CABG x3 back in 2003, PVD, paroxysmal atrial fibrillation on Eliquis, type 2 diabetes on insulin pump and hypertension with history of aortic aneurysm repair in 2003 as well    Assessment & Plan    Acute on chronic diastolic heart failure Persistent atrial fibrillation-now in sinus rhythm Hypertension Coronary artery disease status post CABG  Clinically he appears to be euvolemic we can transition the patient to Lasix 40 mg daily. Thankfully in terms of his atrial fibrillation he is back in sinus rhythm.  Continue his Eliquis for his A-fib as well. Coronary artery disease no anginal symptoms. He has tolerated the losartan, his blood pressure is at target. From a cardiovascular standpoint the patient can be discharged home.  We will sign off at this time  [facility] will sign off.   Medication Recommendations: Aspirin 81 mg daily, Eliquis 5 mg twice daily, losartan 25 mg daily, Aldactone 25 mg daily, atorvastatin 40 mg daily, Lasix 40 mg daily Other recommendations (labs, testing, etc): None Follow up as an outpatient: We will need scheduled follow-up in 2-4 weeks   For questions or updates, please contact [place] Please consult [email or web address] for contact info under Cardiology/STEMI.      Signed, Berniece Salines, DO  02/17/2021, 10:51 AM

## 2021-02-17 NOTE — Progress Notes (Signed)
Heart Failure Nurse Navigator Progress Note  Echo: 55-60%, G1DD  Diuresing well, no euvolemic. Converted back to sinus rhythm with meds.  Appt sch w/Heart & Vascular TOC 2/28   Kevan Rosebush, RN, BSN, [facility] Heart Failure Navigator Heart & Vascular Care Navigation Team

## 2021-02-17 NOTE — Evaluation (Signed)
Occupational Therapy Evaluation Patient Details Name: [name] [name] MRN: [id number] DOB: 05-02-1955 Today's Date: 02/17/2021   History of Present Illness 66 y/o male admitted secondary to worsening SOB. Thought to be secondary to CHF exacerbation. PMH includes CAD s/p CABG, HTN, a fib, and  type 1 diabetes on insulin pump.   Clinical Impression   PTA, pt was living with his son and reports he was performing ADLs, light IADLs, and functional mobility with rollator. Pt currently requiring Supervision for standing ADLs, Min A for LB ADLs, and Min Guard-Min A for functional mobility with RW. Pt presenting with decreased balance, cognition, and strength. However, unsure how far this is from baseline function.  Will continue to follow to facilitate safe dc and further assess cognition during ADLs/IADLs. Recommend dc to home once medically stable per physician.      Recommendations for follow up therapy are one component of a multi-disciplinary discharge planning process, led by the attending physician.  Recommendations may be updated based on patient status, additional functional criteria and insurance authorization.   Follow Up Recommendations  No OT follow up    Assistance Recommended at Discharge Frequent or constant Supervision/Assistance  Patient can return home with the following A little help with walking and/or transfers;A little help with bathing/dressing/bathroom;Assistance with cooking/housework;Assist for transportation;Direct supervision/assist for medications management;Direct supervision/assist for financial management    Functional Status Assessment  Patient has had a recent decline in their functional status and demonstrates the ability to make significant improvements in function in a reasonable and predictable amount of time.  Equipment Recommendations  None recommended by OT    Recommendations for Other Services PT consult     Precautions / Restrictions  Precautions Precautions: Fall Restrictions Weight Bearing Restrictions: No      Mobility Bed Mobility Overal bed mobility: Needs Assistance Bed Mobility: Supine to Sit, Sit to Supine     Supine to sit: Min assist Sit to supine: Supervision   General bed mobility comments: Min A for trunk elevation.    Transfers Overall transfer level: Needs assistance Equipment used: Rolling walker (2 wheels) Transfers: Sit to/from Stand Sit to Stand: Min assist           General transfer comment: Min A for lift assist and steadying.      Balance Overall balance assessment: Mild deficits observed, not formally tested                                         ADL either performed or assessed with clinical judgement   ADL Overall ADL's : Needs assistance/impaired Eating/Feeding: Set up;Sitting   Grooming: Supervision/safety;Oral care;Standing   Upper Body Bathing: Supervision/ safety;Set up;Sitting   Lower Body Bathing: Minimal assistance;Sit to/from stand   Upper Body Dressing : Supervision/safety;Set up;Sitting   Lower Body Dressing: Minimal assistance;Sit to/from stand   Toilet Transfer: Minimal assistance;Ambulation;Rolling walker (2 wheels)           Functional mobility during ADLs: Minimal assistance;Rolling walker (2 wheels) General ADL Comments: Pt presenting with decreased balance and activity tolerance. However, feel pt is close to baseline function.     Vision Baseline Vision/History: 1 Wears glasses       Perception     Praxis      Pertinent Vitals/Pain Pain Assessment Pain Assessment: Faces Faces Pain Scale: Hurts little more Pain Location: LLE from prior injury Pain Descriptors / Indicators: Discomfort,  Grimacing Pain Intervention(s): Monitored during session, Repositioned     Hand Dominance Right   Extremity/Trunk Assessment Upper Extremity Assessment Upper Extremity Assessment: Overall WFL for tasks assessed   Lower  Extremity Assessment Lower Extremity Assessment: Defer to PT evaluation   Cervical / Trunk Assessment Cervical / Trunk Assessment: Kyphotic   Communication Communication Communication: HOH   Cognition Arousal/Alertness: Awake/alert Behavior During Therapy: Flat affect Overall Cognitive Status: Impaired/Different from baseline Area of Impairment: Problem solving                             Problem Solving: Slow processing General Comments: Presenting with decreased problem sovling and requiringi ncreased time. However, feel this is baselien cognition.     General Comments  SpO2 94% on RA.    Exercises     Shoulder Instructions      Home Living Family/patient expects to be discharged to:: Private residence Living Arrangements: Children Available Help at Discharge: Family;Available PRN/intermittently Type of Home: House Home Access: Level entry     Home Layout: One level     Bathroom Shower/Tub: Teacher, early years/pre: Standard     Home Equipment: Public relations account executive (2 wheels);Rollator (4 wheels);Tub bench          Prior Functioning/Environment Prior Level of Function : Independent/Modified Independent             Mobility Comments: Uses rollator for functional mobility ADLs Comments: Reports independence with ADLs, IADLs, and driving.        OT Problem List: Decreased strength;Decreased range of motion;Decreased activity tolerance;Impaired balance (sitting and/or standing);Decreased knowledge of precautions;Decreased knowledge of use of DME or AE      OT Treatment/Interventions: Self-care/ADL training;Therapeutic exercise;Energy conservation;DME and/or AE instruction;Cognitive remediation/compensation    OT Goals(Current goals can be found in the care plan section) Acute Rehab OT Goals Patient Stated Goal: Go home today OT Goal Formulation: With patient Time For Goal Achievement: 03/03/21 Potential to Achieve Goals: Good   OT Frequency: Min 2X/week    Co-evaluation              AM-PAC OT "6 Clicks" Daily Activity     Outcome Measure Help from another person eating meals?: None Help from another person taking care of personal grooming?: A Little Help from another person toileting, which includes using toliet, bedpan, or urinal?: A Little Help from another person bathing (including washing, rinsing, drying)?: A Little Help from another person to put on and taking off regular upper body clothing?: None Help from another person to put on and taking off regular lower body clothing?: A Little 6 Click Score: 20   End of Session Equipment Utilized During Treatment: Rolling walker (2 wheels) Nurse Communication: Mobility status  Activity Tolerance: Patient tolerated treatment well Patient left: in bed;with call bell/phone within reach;with bed alarm set  OT Visit Diagnosis: Unsteadiness on feet (R26.81);Other abnormalities of gait and mobility (R26.89);Muscle weakness (generalized) (M62.81)                Time: 3428-7681 OT Time Calculation (min): 16 min Charges:  OT General Charges $OT Visit: 1 Visit OT Evaluation $OT Eval Low Complexity: 1 Low  Shaquanna Lycan MSOT, OTR/L Acute Rehab Pager: [phone] Office: [place] 02/17/2021, 3:55 PM

## 2021-02-17 NOTE — Progress Notes (Signed)
Hypoglycemic Event  CBG: 59  Treatment: 8 oz juice/soda  Symptoms: None  Follow-up CBG: [id number] CBG Result:58  Possible Reasons for Event: Unknown  Comments/MD notified: MD not notified at this time hypoglycemia protocol followed.    [name] [name]

## 2021-02-17 NOTE — Care Management Important Message (Signed)
Important Message  Patient Details  Name: [name] [name] MRN: [id number] Date of Birth: 1955/11/13   Medicare Important Message Given:  Yes     Shelda Altes 02/17/2021, 9:21 AM

## 2021-02-17 NOTE — Progress Notes (Signed)
Inpatient Diabetes Program Recommendations  AACE/ADA: New Consensus Statement on Inpatient Glycemic Control (2015)  Target Ranges:  Prepandial:   less than 140 mg/dL      Peak postprandial:   less than 180 mg/dL (1-2 hours)      Critically ill patients:  140 - 180 mg/dL   Lab Results  Component Value Date   GLUCAP 344 (H) 02/17/2021   HGBA1C 7.1 (A) 01/12/2021    Review of Glycemic Control  Latest Reference Range & Units 02/16/21 06:26 02/16/21 11:15 02/16/21 16:48 02/16/21 21:00 02/17/21 06:13 02/17/21 06:35 02/17/21 06:55 02/17/21 11:12  Glucose-Capillary 70 - 99 mg/dL 77 145 (H) 117 (H) 211 (H)  Novolog 5 units given from Moderate correction scale 59 (L) 58 (L) 102 (H) 344 (H)   Diabetes history: DM 1 Outpatient Diabetes medications:  Omnipod- 0.7 units/hr (total basal=16.8 units/24 hours) with 4 units bid for meals Current orders for Inpatient glycemic control:  Novolog moderate tid with meals and HS Novolog 3 units tid with meals Semglee 10 units daily  Note: hypoglycemia  Inpatient Diabetes Program Recommendations:    - reduce Novolog Correction to sensitive 0-9 units tid - add Novolog hs scale instead of 4x/day correction   Thanks,  Tama Headings RN, MSN, BC-ADM Inpatient Diabetes Coordinator Team Pager [phone] (8a-5p)

## 2021-02-17 NOTE — Progress Notes (Signed)
Mobility Specialist Progress Note:   02/17/21 1615  Mobility  Activity Ambulated with assistance in hallway  Level of Assistance Minimal assist, patient does 75% or more  Assistive Device Front wheel walker  Distance Ambulated (ft) 300 ft  Activity Response Tolerated well  $Mobility charge 1 Mobility   Pt agreeable to mobility session this afternoon. Required minA to stand from EOB, minG during ambulation. Pt back in bed with all needs met.   Nelta Numbers Acute Rehab Phone: [phone] Office Phone: [phone]

## 2021-02-17 NOTE — Progress Notes (Signed)
PROGRESS NOTE    [name] [name]  MWU:[id number] DOB: 1955-03-29 DOA: 02/14/2021 PCP: Fredirick Lathe, PA-C  Brief Narrative:66/M with history of CAD/CABG in 2003, aortic dissection repair, peripheral vascular disease, paroxysmal A-fib on Eliquis, type 1 diabetes on insulin pump, hypertension presented to the ED with progressive shortness of breath orthopnea and edema X few weeks. -Also ongoing diarrhea for several months -In the ED BP was 180/103, BNP 187, chest x-ray noted new moderate left and small right pleural effusion, pulmonary vascular congestion and interstitial thickening c/w CHF   Subjective: -Feels better overall, not back to baseline yet, still on oxygen  Assessment & Plan: * Acute exacerbation of CHF (congestive heart failure) (HCC) Acute diastolic CHF -Echo with preserved EF, cannot rule out aortic valve vegetation, clinically do not suspect endocarditis -Cardiology following, urine output not accurate -Improving with diuresis, will transition to oral diuretics tomorrow -Started on Aldactone and ARB as well -Ambulate, discharge planning  Chronic diarrhea- (present on admission) Could be related to malabsorption - improved with Imodium -Recommend gastroenterology follow-up especially in the setting of iron deficiency anemia, need to rule out celiac sprue  Type 1 diabetes mellitus (HCC) Insulin pump on hold, continue Semglee, meal coverage insulin, CBGs are stable -Resume insulin pump at discharge  Paroxysmal atrial fibrillation (HCC)- (present on admission) Continue Eliquis, heart rate controlled  Normocytic anemia Anemia panel with iron deficiency, will add IV iron  S/P CABG (coronary artery bypass graft) CAD, CABG in 2003 -Has not followed up with cardiology in many years -Continue aspirin and statin, echo with preserved EF  Hypertension- (present on admission) BP stable, continue IV Lasix, now on Aldactone and losartan too   DVT prophylaxis:  Apixaban Code Status: Full code Family Communication: Discussed with patient and mother at bedside Disposition Plan:   Consultants:  Cardiology  Procedures:   Antimicrobials:    Objective: Vitals:   02/17/21 0412 02/17/21 0500 02/17/21 0749 02/17/21 1116  BP: 140/72  126/72 (!) 154/79  Pulse: (!) 45  60 64  Resp: 17  18 18   Temp: 98.6 F (37 C)  98.8 F (37.1 C) 98.6 F (37 C)  TempSrc: Oral  Oral Oral  SpO2: 97%  97% 94%  Weight:  76.9 kg    Height:        Intake/Output Summary (Last 24 hours) at 02/17/2021 1242 Last data filed at 02/17/2021 1226 Gross per 24 hour  Intake 1145 ml  Output 1100 ml  Net 45 ml   Filed Weights   02/15/21 0405 02/16/21 0421 02/17/21 0500  Weight: 86 kg 77.1 kg 76.9 kg    Examination:  General exam: Pleasant male sitting up in bed, AAOx3, no distress HEENT: Positive JVD CVS: S1-S2, regular rate rhythm Lungs: Few basilar rales Abdomen: Soft, nontender, bowel sounds present Extremities: 1+ edema  Skin: No rashes Psychiatry:  Mood & affect appropriate.     Data Reviewed:   CBC: Recent Labs  Lab 02/14/21 0926 02/15/21 0414 02/16/21 0434 02/17/21 0344  WBC 4.5 5.1 5.9 4.6  HGB 10.9* 11.4* 10.8* 10.9*  HCT 34.1* 34.1* 33.5* 34.1*  MCV 96.3 93.9 94.4 95.0  PLT 178 164 159 725   Basic Metabolic Panel: Recent Labs  Lab 02/14/21 0926 02/15/21 0414 02/16/21 0434 02/17/21 0344  NA 140 140 138 138  K 4.0 4.2 4.1 4.0  CL 104 104 100 100  CO2 32 27 29 30   GLUCOSE 132* 186* 67* 76  BUN 15 14 19 22   CREATININE 0.63  0.65 0.77 0.74  CALCIUM 8.6* 8.5* 8.7* 8.5*   GFR: Estimated Creatinine Clearance: 96.1 mL/min (by C-G formula based on SCr of 0.74 mg/dL). Liver Function Tests: Recent Labs  Lab 02/16/21 0434  AST 16  ALT 13  ALKPHOS 55  BILITOT 0.8  PROT 5.6*  ALBUMIN 2.8*   No results for input(s): LIPASE, AMYLASE in the last 168 hours. No results for input(s): AMMONIA in the last 168 hours. Coagulation  Profile: No results for input(s): INR, PROTIME in the last 168 hours. Cardiac Enzymes: No results for input(s): CKTOTAL, CKMB, CKMBINDEX, TROPONINI in the last 168 hours. BNP (last 3 results) No results for input(s): PROBNP in the last 8760 hours. HbA1C: No results for input(s): HGBA1C in the last 72 hours. CBG: Recent Labs  Lab 02/16/21 2100 02/17/21 0613 02/17/21 0635 02/17/21 0655 02/17/21 1112  GLUCAP 211* 59* 58* 102* 344*   Lipid Profile: Recent Labs    02/16/21 0434  CHOL 78  HDL 39*  LDLCALC 34  TRIG 26  CHOLHDL 2.0   Thyroid Function Tests: No results for input(s): TSH, T4TOTAL, FREET4, T3FREE, THYROIDAB in the last 72 hours. Anemia Panel: Recent Labs    02/16/21 0434  VITAMINB12 310  FOLATE 19.3  FERRITIN 184  TIBC 294  IRON 25*  RETICCTPCT 2.2   Urine analysis:    Component Value Date/Time   COLORURINE YELLOW 02/15/2021 1625   APPEARANCEUR CLEAR 02/15/2021 1625   LABSPEC 1.008 02/15/2021 1625   PHURINE 5.0 02/15/2021 1625   GLUCOSEU NEGATIVE 02/15/2021 1625   GLUCOSEU >=1000 (A) 03/16/2020 0958   HGBUR SMALL (A) 02/15/2021 1625   BILIRUBINUR NEGATIVE 02/15/2021 1625   KETONESUR NEGATIVE 02/15/2021 1625   PROTEINUR 30 (A) 02/15/2021 1625   UROBILINOGEN 0.2 03/16/2020 0958   NITRITE NEGATIVE 02/15/2021 1625   LEUKOCYTESUR NEGATIVE 02/15/2021 1625   Sepsis Labs: @LABRCNTIP (procalcitonin:4,lacticidven:4)  ) Recent Results (from the past 240 hour(s))  Resp Panel by RT-PCR (Flu A&B, Covid) Nasopharyngeal Swab     Status: None   Collection Time: 02/14/21 11:15 AM   Specimen: Nasopharyngeal Swab; Nasopharyngeal(NP) swabs in vial transport medium  Result Value Ref Range Status   SARS Coronavirus 2 by RT PCR NEGATIVE NEGATIVE Final    Comment: (NOTE) SARS-CoV-2 target nucleic acids are NOT DETECTED.  The SARS-CoV-2 RNA is generally detectable in upper respiratory specimens during the acute phase of infection. The lowest concentration of  SARS-CoV-2 viral copies this assay can detect is 138 copies/mL. A negative result does not preclude SARS-Cov-2 infection and should not be used as the sole basis for treatment or other patient management decisions. A negative result may occur with  improper specimen collection/handling, submission of specimen other than nasopharyngeal swab, presence of viral mutation(s) within the areas targeted by this assay, and inadequate number of viral copies(<138 copies/mL). A negative result must be combined with clinical observations, patient history, and epidemiological information. The expected result is Negative.  Fact Sheet for Patients:  EntrepreneurPulse.com.au  Fact Sheet for Healthcare Providers:  IncredibleEmployment.be  This test is no t yet approved or cleared by the [place] FDA and  has been authorized for detection and/or diagnosis of SARS-CoV-2 by FDA under an Emergency Use Authorization (EUA). This EUA will remain  in effect (meaning this test can be used) for the duration of the COVID-19 declaration under Section 564(b)(1) of the Act, 21 U.S.C.section 360bbb-3(b)(1), unless the authorization is terminated  or revoked sooner.       Influenza A by PCR NEGATIVE  NEGATIVE Final   Influenza B by PCR NEGATIVE NEGATIVE Final    Comment: (NOTE) The Xpert Xpress SARS-CoV-2/FLU/RSV plus assay is intended as an aid in the diagnosis of influenza from Nasopharyngeal swab specimens and should not be used as a sole basis for treatment. Nasal washings and aspirates are unacceptable for Xpert Xpress SARS-CoV-2/FLU/RSV testing.  Fact Sheet for Patients: EntrepreneurPulse.com.au  Fact Sheet for Healthcare Providers: IncredibleEmployment.be  This test is not yet approved or cleared by the [place] FDA and has been authorized for detection and/or diagnosis of SARS-CoV-2 by FDA under an Emergency Use  Authorization (EUA). This EUA will remain in effect (meaning this test can be used) for the duration of the COVID-19 declaration under Section 564(b)(1) of the Act, 21 U.S.C. section 360bbb-3(b)(1), unless the authorization is terminated or revoked.  Performed at [facility], [address] [place], [place] [address]      Radiology Studies: No results found.   Scheduled Meds:  apixaban  5 mg Oral BID   aspirin EC  81 mg Oral Daily   atorvastatin  40 mg Oral Daily   cholecalciferol  5,000 Units Oral Daily   furosemide  40 mg Intravenous BID   insulin aspart  0-15 Units Subcutaneous TID AC & HS   insulin glargine-yfgn  5 Units Subcutaneous Daily   losartan  25 mg Oral Daily   PARoxetine  20 mg Oral Daily   spironolactone  25 mg Oral Daily   Continuous Infusions:   LOS: 3 days    Time spent: 25min  Domenic Polite, MD [facility]   02/17/2021, 12:42 PM

## 2021-02-17 NOTE — Evaluation (Signed)
Physical Therapy Evaluation Patient Details Name: [name] [name] MRN: [id number] DOB: 06/01/1955 Today's Date: 02/17/2021  History of Present Illness  Pt is a 66 y/o male admitted secondary to worsening SOB. Thought to be secondary to CHF exacerbation. PMH includes CAD s/p CABG, HTN, a fib, and DM.  Clinical Impression  Pt admitted secondary to problem above with deficits below. Pt requiring min A for bed mobility and transfers and min guard to supervision for gait. Overall tolerated well, but reports feeling weak. Recommending HHPT at d/c to address current deficits. Will continue to follow acutely.        Recommendations for follow up therapy are one component of a multi-disciplinary discharge planning process, led by the attending physician.  Recommendations may be updated based on patient status, additional functional criteria and insurance authorization.  Follow Up Recommendations Home health PT    Assistance Recommended at Discharge Intermittent Supervision/Assistance  Patient can return home with the following  A little help with walking and/or transfers;A little help with bathing/dressing/bathroom;Help with stairs or ramp for entrance;Assist for transportation    Equipment Recommendations None recommended by PT  Recommendations for Other Services       Functional Status Assessment Patient has had a recent decline in their functional status and demonstrates the ability to make significant improvements in function in a reasonable and predictable amount of time.     Precautions / Restrictions Precautions Precautions: Fall Restrictions Weight Bearing Restrictions: No      Mobility  Bed Mobility Overal bed mobility: Needs Assistance Bed Mobility: Supine to Sit, Sit to Supine     Supine to sit: Min assist Sit to supine: Supervision   General bed mobility comments: Min A for trunk elevation.    Transfers Overall transfer level: Needs assistance Equipment used:  Rolling walker (2 wheels) Transfers: Sit to/from Stand Sit to Stand: Min assist           General transfer comment: Min A for lift assist and steadying.    Ambulation/Gait Ambulation/Gait assistance: Min guard, Supervision Gait Distance (Feet): 150 Feet Assistive device: Rolling walker (2 wheels) Gait Pattern/deviations: Step-through pattern, Decreased stride length Gait velocity: Decreased     General Gait Details: Min guard to supervision for safety. No overt LOB noted with use of RW.  Stairs            Wheelchair Mobility    Modified Rankin (Stroke Patients Only)       Balance Overall balance assessment: Mild deficits observed, not formally tested                                           Pertinent Vitals/Pain Pain Assessment Pain Assessment: No/denies pain    Home Living Family/patient expects to be discharged to:: Private residence Living Arrangements: Children Available Help at Discharge: Family;Available PRN/intermittently Type of Home: House Home Access: Level entry       Home Layout: One level Home Equipment: BSC/3in1;Rolling Walker (2 wheels);Rollator (4 wheels);Tub bench      Prior Function Prior Level of Function : Independent/Modified Independent             Mobility Comments: Uses RW for ambulation ADLs Comments: Reports independence     Hand Dominance        Extremity/Trunk Assessment   Upper Extremity Assessment Upper Extremity Assessment: Defer to OT evaluation    Lower Extremity Assessment Lower  Extremity Assessment: Generalized weakness    Cervical / Trunk Assessment Cervical / Trunk Assessment: Kyphotic  Communication   Communication: HOH  Cognition Arousal/Alertness: Awake/alert Behavior During Therapy: Flat affect Overall Cognitive Status: Impaired/Different from baseline Area of Impairment: Problem solving                             Problem Solving: Slow  processing General Comments: Required increased time to process commands. Very flat throughout.        General Comments      Exercises     Assessment/Plan    PT Assessment Patient needs continued PT services  PT Problem List Decreased strength;Decreased activity tolerance;Decreased mobility;Decreased balance;Decreased cognition       PT Treatment Interventions DME instruction;Gait training;Stair training;Balance training;Therapeutic activities;Functional mobility training;Therapeutic exercise;Patient/family education    PT Goals (Current goals can be found in the Care Plan section)  Acute Rehab PT Goals Patient Stated Goal: to go home PT Goal Formulation: With patient Time For Goal Achievement: 03/03/21 Potential to Achieve Goals: Fair    Frequency Min 3X/week     Co-evaluation               AM-PAC PT "6 Clicks" Mobility  Outcome Measure Help needed turning from your back to your side while in a flat bed without using bedrails?: None Help needed moving from lying on your back to sitting on the side of a flat bed without using bedrails?: A Little Help needed moving to and from a bed to a chair (including a wheelchair)?: A Little Help needed standing up from a chair using your arms (e.g., wheelchair or bedside chair)?: A Little Help needed to walk in hospital room?: A Little Help needed climbing 3-5 steps with a railing? : A Lot 6 Click Score: 18    End of Session   Activity Tolerance: Patient tolerated treatment well Patient left: in bed;with call bell/phone within reach;with family/visitor present Nurse Communication: Mobility status PT Visit Diagnosis: Muscle weakness (generalized) (M62.81)    Time: 6644-0347 PT Time Calculation (min) (ACUTE ONLY): 15 min   Charges:   PT Evaluation $PT Eval Low Complexity: 1 Low          Lou Miner, DPT  Acute Rehabilitation Services  Pager: [phone] Office: [phone]   Rudean Hitt 02/17/2021, 10:16 AM

## 2021-02-18 LAB — GLUCOSE, CAPILLARY: Glucose-Capillary: 188 mg/dL — ABNORMAL HIGH (ref 70–99)

## 2021-02-18 MED ORDER — FUROSEMIDE 40 MG PO TABS: 40.0000 mg | ORAL_TABLET | Freq: Every day | ORAL | 0 refills | Status: DC

## 2021-02-18 MED ORDER — SPIRONOLACTONE 25 MG PO TABS: 25.0000 mg | ORAL_TABLET | Freq: Every day | ORAL | 0 refills | Status: DC

## 2021-02-18 MED ORDER — INSULIN ASPART 100 UNIT/ML IJ SOLN
0.0000 [IU] | Freq: Three times a day (TID) | INTRAMUSCULAR | Status: DC
  Administered 2021-02-18: 3 [IU] via SUBCUTANEOUS

## 2021-02-18 MED ORDER — LOSARTAN POTASSIUM 25 MG PO TABS: 25.0000 mg | ORAL_TABLET | Freq: Every day | ORAL | 0 refills | Status: DC

## 2021-02-18 MED ORDER — ASPIRIN 81 MG PO TBEC: 81.0000 mg | DELAYED_RELEASE_TABLET | Freq: Every day | ORAL | 0 refills | Status: DC

## 2021-02-18 NOTE — TOC Transition Note (Signed)
Transition of Care [facility]) - CM/SW Discharge Note   Patient Details  Name: Haeden Hudock MRN: [id number] Date of Birth: October 08, 1955  Transition of Care [facility]) CM/SW Contact:  Konrad Penta, RN Phone Number: [phone] 02/18/2021, 10:20 AM   Clinical Narrative:  Spoke with Mr. Eveleth prior transition home. Discussed home health PT recommendation. He has had [facility] in the past. Agreeable to [facility] again. Lives with son. Denies having any other TOC needs.   Delsa Sale with [facility] accepted referral for [facility] PT.   No further needs assessed.     Final next level of care: [place] Barriers to Discharge: No Barriers Identified   Patient Goals and CMS Choice Patient states their goals for this hospitalization and ongoing recovery are:: return home CMS Medicare.gov Compare Post Acute Care list provided to:: Patient Choice offered to / list presented to : Patient  Discharge Placement                       Discharge Plan and Services                          HH Arranged: PT [facility] Agency: [facility] Date [facility] Agency Contacted: 02/18/21 Time [place]: [id number] Representative spoke with at [place]: [place] Interventions     Readmission Risk Interventions No flowsheet data found.

## 2021-02-19 NOTE — Discharge Summary (Addendum)
Physician Discharge Summary  [name] [name] WJX:[id number] DOB: 05/07/1955 DOA: 02/14/2021  PCP: Fredirick Lathe, PA-C  Admit date: 02/14/2021 Discharge date: 2/18 /2023  Time spent: 35 minutes  Recommendations for Outpatient Follow-up:  Cardiology Dr. Audie Box in 2 to 3 weeks 2.  PCP in 1 week, please check BMP at follow-up 3. Gastroenterology referral for chronic diarrhea, iron deficiency anemia 4.  Please review all imaging studies from this admission for incidental findings that need follow-up   Discharge Diagnoses:    Acute Diastolic CHF   Coronary artery disease   Hypertension   S/P CABG (coronary artery bypass graft)   Normocytic anemia   Paroxysmal atrial fibrillation (HCC)   Diarrhea   Sinus bradycardia   Type 1 diabetes mellitus (HCC)   Chronic diarrhea   Iron defi anemia  Discharge Condition: Stable  Diet recommendation: Diabetic, heart healthy  Filed Weights   02/16/21 0421 02/17/21 0500 02/18/21 0500  Weight: 77.1 kg 76.9 kg 77.7 kg    History of present illness:  66/M with history of CAD/CABG in 2003, aortic dissection repair, peripheral vascular disease, paroxysmal A-fib on Eliquis, type 1 diabetes on insulin pump, hypertension presented to the ED with progressive shortness of breath orthopnea and edema X few weeks. -Also ongoing diarrhea for several months -In the ED BP was 180/103, BNP 187, chest x-ray noted new moderate left and small right pleural effusion, pulmonary vascular congestion and interstitial thickening c/w CHF  Hospital Course:   Acute Diastolic CHF -Echo with preserved EF, cannot rule out aortic valve vegetation, clinically do not suspect endocarditis -Cardiology consulted, diuresed with IV Lasix with good response  -Transitioned to oral Lasix 40 Mg daily, losartan, Aldactone  -Further titrate GDMT as tolerated at follow-up -Follow-up with Dr. Audie Box in 2 to 3 weeks   Chronic diarrhea- (present on admission) Could be related to  malabsorption - improved with Imodium -Recommend gastroenterology follow-up especially in the setting of iron deficiency anemia, need to rule out celiac sprue   Type 1 diabetes mellitus (HCC) Insulin pump on hold, continue Semglee, meal coverage insulin, CBGs are stable -Resume insulin pump at discharge   Paroxysmal atrial fibrillation ([place])- (present on admission) Continue Eliquis, heart rate controlled   Normocytic anemia Anemia panel with iron deficiency, will IV iron this admission   S/P CABG (coronary artery bypass graft) CAD, CABG in 2003 -Has not followed up with cardiology in many years -Continue aspirin and statin, echo with preserved EF   Hypertension- (present on admission) BP stable, continue IV Lasix, now on Aldactone and losartan too      Consultations: Cardiology  Discharge Exam: Vitals:   02/18/21 0500 02/18/21 0939  BP: (!) 167/80 (!) 149/88  Pulse:  63  Resp: 17 18  Temp: 98.6 F (37 C) 98.6 F (37 C)  SpO2: 92% 95%   General exam: Pleasant male sitting up in bed, AAOx3, no distress HEENT: Positive JVD CVS: S1-S2, regular rate rhythm Lungs: Few basilar rales Abdomen: Soft, nontender, bowel sounds present Extremities: 1+ edema  Skin: No rashes Psychiatry:  Mood & affect appropriate.    Discharge Instructions   Discharge Instructions     Ambulatory referral to Gastroenterology   Complete by: As directed    Chronic diarrhea and iron defi anemia   What is the reason for referral?: Colonoscopy   Diet - low sodium heart healthy   Complete by: As directed    Increase activity slowly   Complete by: As directed    No  wound care   Complete by: As directed       Allergies as of 02/18/2021   No Known Allergies      Medication List     STOP taking these medications    lidocaine 5 % Commonly known as: Lidoderm   traZODone 50 MG tablet Commonly known as: DESYREL   triamcinolone cream 0.1 % Commonly known as: KENALOG       TAKE  these medications    apixaban 5 MG Tabs tablet Commonly known as: ELIQUIS Take 1 tablet (5 mg total) by mouth 2 (two) times daily.   aspirin 81 MG EC tablet Take 1 tablet (81 mg total) by mouth daily. What changed:  medication strength how much to take   atorvastatin 40 MG tablet Commonly known as: LIPITOR Take 1 tablet (40 mg total) by mouth daily.   B COMPLEX-B12 PO Take 1 tablet by mouth daily.   BD Pen Needle Nano 2nd Gen 32G X 4 MM Misc Generic drug: Insulin Pen Needle USE FOR INSULIN   Dexcom G6 Sensor Misc 1 Device by Does not apply route See admin instructions. Change every 10 days   Dexcom G6 Transmitter Misc Use as instructed to check blood sugar   furosemide 40 MG tablet Commonly known as: LASIX Take 1 tablet (40 mg total) by mouth daily.   losartan 25 MG tablet Commonly known as: COZAAR Take 1 tablet (25 mg total) by mouth daily.   mirtazapine 15 MG tablet Commonly known as: Remeron Take 1 tablet (15 mg total) by mouth at bedtime.   Omnipod 5 G6 Intro (Gen 5) Kit 1 Device by Does not apply route every 3 (three) days.   OneTouch Delica Plus [id number] Misc Apply 1 each topically 4 (four) times daily.   OneTouch Verio Flex System w/Device Kit   OneTouch Verio test strip Generic drug: glucose blood 1 each 4 (four) times daily.   PARoxetine 20 MG tablet Commonly known as: PAXIL Take 1 tablet (20 mg total) by mouth daily.   spironolactone 25 MG tablet Commonly known as: ALDACTONE Take 1 tablet (25 mg total) by mouth daily.   Vitamin D 125 MCG (5000 UT) Caps Take 5,000 Units by mouth daily.       No Known Allergies  Follow-up Information     [name], [name], PA-C Follow up.   Specialty: Physician Assistant Contact information: [place] [address] [phone]         Geralynn Rile, MD .   Specialties: Cardiology, Internal Medicine, Radiology Contact information: [place]  [address] [phone]                  The results of significant diagnostics from this hospitalization (including imaging, microbiology, ancillary and laboratory) are listed below for reference.    Significant Diagnostic Studies: DG Chest 2 View  Result Date: 02/14/2021 CLINICAL DATA:  Shortness of breath. EXAM: CHEST - 2 VIEW COMPARISON:  Chest x-ray dated December 10, 2020. FINDINGS: Heart is at the upper limits of normal in size status post CABG. New pulmonary vascular congestion and mild interstitial thickening. New moderate left and small right pleural effusions with left greater than right lower lobe atelectasis. No pneumothorax. No acute osseous abnormality. IMPRESSION: 1. New congestive heart failure. Electronically Signed   By: Titus Dubin M.D.   On: 02/14/2021 09:06   Panretinal Photocoagulation - OD - Right Eye  Result Date: 01/31/2021 LASER PROCEDURE NOTE Diagnosis:  Proliferative Diabetic Retinopathy, RIGHT EYE Procedure:  Pan-retinal photocoagulation using slit lamp laser, RIGHT EYE, fill in Anesthesia:  Topical Surgeon: [name], MD, PhD Informed consent obtained, operative eye marked, and time out performed prior to initiation of laser. Lumenis [id number] slit lamp laser Pattern: 2x2, 3x3 square Power: 400 mW Duration: 40 msec Spot size: 200 microns # spots: 086 spots Complications: None. Notes: vitreous heme obscuring view and preventing laser up take inferiorly and scattered focal areas RTC: 2 wks -- DFE/OCT, possible injection Patient tolerated the procedure well and received written and verbal post-procedure care information/education.   ECHOCARDIOGRAM COMPLETE  Result Date: 02/15/2021    ECHOCARDIOGRAM REPORT   Patient Name:   [name] [name] Date of Exam: 02/15/2021 Medical Rec #:  [id number]            Height:       74.0 in Accession #:    [id number]           Weight:       189.7 lb Date of Birth:  [birth year]             BSA:          2.125 m Patient Age:     19 years             BP:           136/71 mmHg Patient Gender: M                    HR:           49 bpm. Exam Location:  Inpatient Procedure: 2D Echo, Cardiac Doppler and Color Doppler Indications:    I50.21 CHF  History:        Patient has prior history of Echocardiogram examinations, most                 recent 05/17/2020. Risk Factors:Diabetes, Dyslipidemia,                 Hypertension and Family History of Coronary Artery Disease. CAD.  Sonographer:    Beryle Beams Referring Phys: [id number] [place] T TU IMPRESSIONS  1. Left ventricular ejection fraction, by estimation, is 55 to 60%. The left ventricle has normal function. The left ventricle has no regional wall motion abnormalities. There is mild left ventricular hypertrophy. Left ventricular diastolic parameters are consistent with Grade I diastolic dysfunction (impaired relaxation).  2. Right ventricular systolic function is mildly reduced. The right ventricular size is normal.  3. Left atrial size was mildly dilated.  4. The mitral valve is normal in structure. Mild mitral valve regurgitation. No evidence of mitral stenosis.  5. The aortic valve is tricuspid. Aortic valve regurgitation is not visualized. Aortic valve is calcified. In the parasternal long axis, cannot rule out some mobility with question of small vegetation. Not noted in other views. Clinical correlation suggested.  6. The inferior vena cava is dilated in size with >50% respiratory variability, suggesting right atrial pressure of 8 mmHg.  7. Left pleural effusion noted. FINDINGS  Left Ventricle: Left ventricular ejection fraction, by estimation, is 55 to 60%. The left ventricle has normal function. The left ventricle has no regional wall motion abnormalities. The left ventricular internal cavity size was normal in size. There is  mild left ventricular hypertrophy. Left ventricular diastolic parameters are consistent with Grade I diastolic dysfunction (impaired relaxation). Right Ventricle: The  right ventricular size is normal. No increase in right ventricular wall thickness. Right ventricular systolic  function is mildly reduced. Left Atrium: Left atrial size was mildly dilated. Right Atrium: Right atrial size was normal in size. Pericardium: Left pleural effusion noted. There is no evidence of pericardial effusion. Mitral Valve: The mitral valve is normal in structure. There is mild calcification of the mitral valve leaflet(s). Mild mitral valve regurgitation. No evidence of mitral valve stenosis. MV peak gradient, 56.9 mmHg. The mean mitral valve gradient is 40.0 mmHg. Tricuspid Valve: The tricuspid valve is normal in structure. Tricuspid valve regurgitation is not demonstrated. Aortic Valve: The aortic valve is tricuspid. Aortic valve regurgitation is not visualized. Aortic valve sclerosis/calcification is present, without any evidence of aortic stenosis. Aortic valve mean gradient measures 3.0 mmHg. Aortic valve peak gradient measures 4.1 mmHg. Aortic valve area, by VTI measures 2.07 cm. Pulmonic Valve: The pulmonic valve was normal in structure. Pulmonic valve regurgitation is not visualized. Aorta: The aortic root is normal in size and structure. Venous: The inferior vena cava is dilated in size with greater than 50% respiratory variability, suggesting right atrial pressure of 8 mmHg. IAS/Shunts: No atrial level shunt detected by color flow Doppler.  LEFT VENTRICLE PLAX 2D LVIDd:         4.20 cm LVIDs:         2.70 cm LV PW:         1.20 cm LV IVS:        1.20 cm LVOT diam:     1.90 cm LV SV:         60 LV SV Index:   28 LVOT Area:     2.84 cm  LV Volumes (MOD) LV vol d, MOD A2C: 111.0 ml LV vol d, MOD A4C: 91.2 ml LV vol s, MOD A2C: 49.5 ml LV vol s, MOD A4C: 37.8 ml LV SV MOD A2C:     61.5 ml LV SV MOD A4C:     91.2 ml LV SV MOD BP:      57.3 ml RIGHT VENTRICLE            IVC RV S prime:     7.83 cm/s  IVC diam: 2.30 cm RVOT diam:      2.60 cm TAPSE (M-mode): 1.0 cm LEFT ATRIUM             Index         RIGHT ATRIUM          Index LA diam:        4.40 cm 2.07 cm/m   RA Area:     9.24 cm LA Vol (A2C):   84.6 ml 39.81 ml/m  RA Volume:   16.40 ml 7.72 ml/m LA Vol (A4C):   68.0 ml 32.00 ml/m LA Biplane Vol: 78.7 ml 37.03 ml/m  AORTIC VALVE                    PULMONIC VALVE AV Area (Vmax):    2.42 cm     PV Vmax:       0.57 m/s AV Area (Vmean):   2.15 cm     PV Vmean:      42.400 cm/s AV Area (VTI):     2.07 cm     PV VTI:        0.141 m AV Vmax:           101.00 cm/s  PV Peak grad:  1.3 mmHg AV Vmean:          76.300 cm/s  PV Mean grad:  1.0 mmHg AV VTI:            0.289 m AV Peak Grad:      4.1 mmHg AV Mean Grad:      3.0 mmHg LVOT Vmax:         86.20 cm/s LVOT Vmean:        57.900 cm/s LVOT VTI:          0.211 m LVOT/AV VTI ratio: 0.73  AORTA Ao Root diam: 3.40 cm Ao Asc diam:  3.50 cm MITRAL VALVE MV Area (PHT): 3.87 cm     SHUNTS MV Area VTI:   0.55 cm     Systemic VTI:  0.21 m MV Peak grad:  56.9 mmHg    Systemic Diam: 1.90 cm MV Mean grad:  40.0 mmHg    Pulmonic Diam: 2.60 cm MV Vmax:       3.77 m/s MV Vmean:      304.0 cm/s MV Decel Time: 196 msec MV E velocity: 107.00 cm/s MV A velocity: 113.00 cm/s MV E/A ratio:  0.95 Dalton McleanMD Electronically signed by Franki Monte Signature Date/Time: 02/15/2021/4:00:34 PM    Final    Intravitreal Injection, Pharmacologic Agent - OD - Right Eye  Result Date: 01/27/2021 Time Out 01/27/2021. 10:05 AM. Confirmed correct patient, procedure, site, and patient consented. Anesthesia Topical anesthesia was used. Anesthetic medications included Lidocaine 2%, Proparacaine 0.5%. Procedure Preparation included 5% betadine to ocular surface, eyelid speculum. A (32g) needle was used. Injection: 1.25 mg Bevacizumab 1.53m/0.05ml   Route: Intravitreal, Site: Right Eye   NDC: 50242-060-01, Lot: [id number], Expiration date: 03/13/2021, Waste: 0.05 mL Post-op Post injection exam found visual acuity of at least counting fingers. The patient tolerated the procedure well.  There were no complications. The patient received written and verbal post procedure care education.   OCT, Retina - OU - Both Eyes  Result Date: 01/31/2021 Right Eye Quality was good. Central Foveal Thickness: 286. Progression has improved. Findings include abnormal foveal contour, no SRF, intraretinal fluid, preretinal fibrosis, intraretinal hyper-reflective material (Partial PVD, interval improvement in IRF greatest temporal macula and fovea, interval improvement in NV on en face images). Left Eye Findings include (No interpretable images obtained today). Notes *Images captured and stored on drive Diagnosis / Impression: PDR with DME OU OD: Partial PVD, Partial PVD, interval improvement in IRF greatest temporal macula and fovea, interval improvement in NV on en face images OS: no images obtained today Clinical management: See below Abbreviations: NFP - Normal foveal profile. CME - cystoid macular edema. PED - pigment epithelial detachment. IRF - intraretinal fluid. SRF - subretinal fluid. EZ - ellipsoid zone. ERM - epiretinal membrane. ORA - outer retinal atrophy. ORT - outer retinal tubulation. SRHM - subretinal hyper-reflective material. IRHM - intraretinal hyper-reflective material   OCT, Retina - OU - Both Eyes  Result Date: 01/27/2021 Right Eye Quality was good. Central Foveal Thickness: 542. Progression has worsened. Findings include abnormal foveal contour, no SRF, intraretinal fluid, preretinal fibrosis, intraretinal hyper-reflective material (Partial PVD, interval increase in IRF greatest temporal macula nd fovea, interval worsening of NV on en face images). Left Eye Quality was good. Progression has improved. Findings include abnormal foveal contour, no SRF, intraretinal fluid, preretinal fibrosis, macular pucker (Mild interval improvement in diffuse vitreous opacities, Retina grossly attached on scattered peripheral sweeps). Notes *Images captured and stored on drive Diagnosis / Impression: PDR  with DME OU OD: Partial PVD, interval increase in IRF greatest temporal macula and fovea, interval worsening of NV on  en face images OS: Mild interval improvement in diffuse vitreous opacities, Retina grossly attached on scattered peripheral sweeps Clinical management: See below Abbreviations: NFP - Normal foveal profile. CME - cystoid macular edema. PED - pigment epithelial detachment. IRF - intraretinal fluid. SRF - subretinal fluid. EZ - ellipsoid zone. ERM - epiretinal membrane. ORA - outer retinal atrophy. ORT - outer retinal tubulation. SRHM - subretinal hyper-reflective material. IRHM - intraretinal hyper-reflective material    Microbiology: Recent Results (from the past 240 hour(s))  Resp Panel by RT-PCR (Flu A&B, Covid) Nasopharyngeal Swab     Status: None   Collection Time: 02/14/21 11:15 AM   Specimen: Nasopharyngeal Swab; Nasopharyngeal(NP) swabs in vial transport medium  Result Value Ref Range Status   SARS Coronavirus 2 by RT PCR NEGATIVE NEGATIVE Final    Comment: (NOTE) SARS-CoV-2 target nucleic acids are NOT DETECTED.  The SARS-CoV-2 RNA is generally detectable in upper respiratory specimens during the acute phase of infection. The lowest concentration of SARS-CoV-2 viral copies this assay can detect is 138 copies/mL. A negative result does not preclude SARS-Cov-2 infection and should not be used as the sole basis for treatment or other patient management decisions. A negative result may occur with  improper specimen collection/handling, submission of specimen other than nasopharyngeal swab, presence of viral mutation(s) within the areas targeted by this assay, and inadequate number of viral copies(<138 copies/mL). A negative result must be combined with clinical observations, patient history, and epidemiological information. The expected result is Negative.  Fact Sheet for Patients:  EntrepreneurPulse.com.au  Fact Sheet for Healthcare Providers:   IncredibleEmployment.be  This test is no t yet approved or cleared by the [place] FDA and  has been authorized for detection and/or diagnosis of SARS-CoV-2 by FDA under an Emergency Use Authorization (EUA). This EUA will remain  in effect (meaning this test can be used) for the duration of the COVID-19 declaration under Section 564(b)(1) of the Act, 21 U.S.C.section 360bbb-3(b)(1), unless the authorization is terminated  or revoked sooner.       Influenza A by PCR NEGATIVE NEGATIVE Final   Influenza B by PCR NEGATIVE NEGATIVE Final    Comment: (NOTE) The Xpert Xpress SARS-CoV-2/FLU/RSV plus assay is intended as an aid in the diagnosis of influenza from Nasopharyngeal swab specimens and should not be used as a sole basis for treatment. Nasal washings and aspirates are unacceptable for Xpert Xpress SARS-CoV-2/FLU/RSV testing.  Fact Sheet for Patients: EntrepreneurPulse.com.au  Fact Sheet for Healthcare Providers: IncredibleEmployment.be  This test is not yet approved or cleared by the [place] FDA and has been authorized for detection and/or diagnosis of SARS-CoV-2 by FDA under an Emergency Use Authorization (EUA). This EUA will remain in effect (meaning this test can be used) for the duration of the COVID-19 declaration under Section 564(b)(1) of the Act, 21 U.S.C. section 360bbb-3(b)(1), unless the authorization is terminated or revoked.  Performed at [facility], [address], [place], [place] [address]      Labs: Basic Metabolic Panel: Recent Labs  Lab 02/14/21 0926 02/15/21 0414 02/16/21 0434 02/17/21 0344  NA 140 140 138 138  K 4.0 4.2 4.1 4.0  CL 104 104 100 100  CO2 32 _0 GLUCOSE 132* 186* 67* 76  BUN _1 CREATININE 0.63 0.65 0.77 0.74  CALCIUM 8.6* 8.5* 8.7* 8.5*   Liver Function Tests: Recent Labs  Lab 02/16/21 0434  AST 16  ALT 13  ALKPHOS 55   BILITOT  0.8  PROT 5.6*  ALBUMIN 2.8*   No results for input(s): LIPASE, AMYLASE in the last 168 hours. No results for input(s): AMMONIA in the last 168 hours. CBC: Recent Labs  Lab 02/14/21 0926 02/15/21 0414 02/16/21 0434 02/17/21 0344  WBC 4.5 5.1 5.9 4.6  HGB 10.9* 11.4* 10.8* 10.9*  HCT 34.1* 34.1* 33.5* 34.1*  MCV 96.3 93.9 94.4 95.0  PLT 178 164 159 155   Cardiac Enzymes: No results for input(s): CKTOTAL, CKMB, CKMBINDEX, TROPONINI in the last 168 hours. BNP: BNP (last 3 results) Recent Labs    02/14/21 0926  BNP 187.4*    ProBNP (last 3 results) No results for input(s): PROBNP in the last 8760 hours.  CBG: Recent Labs  Lab 02/17/21 0655 02/17/21 1112 02/17/21 1621 02/17/21 2047 02/18/21 0616  GLUCAP 102* 344* 317* 178* 188*       Signed:  Domenic Polite MD.  [facility] 02/19/2021, 2:36 PM

## 2021-02-20 ENCOUNTER — Telehealth: Payer: Self-pay

## 2021-02-20 DIAGNOSIS — M1990 Unspecified osteoarthritis, unspecified site: Secondary | ICD-10-CM | POA: Diagnosis not present

## 2021-02-20 DIAGNOSIS — E785 Hyperlipidemia, unspecified: Secondary | ICD-10-CM | POA: Diagnosis not present

## 2021-02-20 DIAGNOSIS — M47812 Spondylosis without myelopathy or radiculopathy, cervical region: Secondary | ICD-10-CM | POA: Diagnosis not present

## 2021-02-20 DIAGNOSIS — E1142 Type 2 diabetes mellitus with diabetic polyneuropathy: Secondary | ICD-10-CM | POA: Diagnosis not present

## 2021-02-20 DIAGNOSIS — L409 Psoriasis, unspecified: Secondary | ICD-10-CM | POA: Diagnosis not present

## 2021-02-20 DIAGNOSIS — I480 Paroxysmal atrial fibrillation: Secondary | ICD-10-CM | POA: Diagnosis not present

## 2021-02-20 DIAGNOSIS — E113299 Type 2 diabetes mellitus with mild nonproliferative diabetic retinopathy without macular edema, unspecified eye: Secondary | ICD-10-CM | POA: Diagnosis not present

## 2021-02-20 DIAGNOSIS — F17210 Nicotine dependence, cigarettes, uncomplicated: Secondary | ICD-10-CM | POA: Diagnosis not present

## 2021-02-20 DIAGNOSIS — Z7982 Long term (current) use of aspirin: Secondary | ICD-10-CM | POA: Diagnosis not present

## 2021-02-20 DIAGNOSIS — F419 Anxiety disorder, unspecified: Secondary | ICD-10-CM | POA: Diagnosis not present

## 2021-02-20 DIAGNOSIS — N401 Enlarged prostate with lower urinary tract symptoms: Secondary | ICD-10-CM | POA: Diagnosis not present

## 2021-02-20 DIAGNOSIS — M720 Palmar fascial fibromatosis [Dupuytren]: Secondary | ICD-10-CM | POA: Diagnosis not present

## 2021-02-20 DIAGNOSIS — I5030 Unspecified diastolic (congestive) heart failure: Secondary | ICD-10-CM | POA: Diagnosis not present

## 2021-02-20 DIAGNOSIS — I25812 Atherosclerosis of bypass graft of coronary artery of transplanted heart without angina pectoris: Secondary | ICD-10-CM | POA: Diagnosis not present

## 2021-02-20 DIAGNOSIS — N138 Other obstructive and reflux uropathy: Secondary | ICD-10-CM | POA: Diagnosis not present

## 2021-02-20 DIAGNOSIS — I10 Essential (primary) hypertension: Secondary | ICD-10-CM | POA: Diagnosis not present

## 2021-02-20 DIAGNOSIS — K5904 Chronic idiopathic constipation: Secondary | ICD-10-CM | POA: Diagnosis not present

## 2021-02-20 DIAGNOSIS — F3341 Major depressive disorder, recurrent, in partial remission: Secondary | ICD-10-CM | POA: Diagnosis not present

## 2021-02-20 DIAGNOSIS — G8929 Other chronic pain: Secondary | ICD-10-CM | POA: Diagnosis not present

## 2021-02-20 DIAGNOSIS — E1151 Type 2 diabetes mellitus with diabetic peripheral angiopathy without gangrene: Secondary | ICD-10-CM | POA: Diagnosis not present

## 2021-02-20 DIAGNOSIS — E11621 Type 2 diabetes mellitus with foot ulcer: Secondary | ICD-10-CM | POA: Diagnosis not present

## 2021-02-20 DIAGNOSIS — Z7901 Long term (current) use of anticoagulants: Secondary | ICD-10-CM | POA: Diagnosis not present

## 2021-02-20 DIAGNOSIS — E559 Vitamin D deficiency, unspecified: Secondary | ICD-10-CM | POA: Diagnosis not present

## 2021-02-20 DIAGNOSIS — D649 Anemia, unspecified: Secondary | ICD-10-CM | POA: Diagnosis not present

## 2021-02-20 DIAGNOSIS — L97512 Non-pressure chronic ulcer of other part of right foot with fat layer exposed: Secondary | ICD-10-CM | POA: Diagnosis not present

## 2021-02-20 NOTE — Telephone Encounter (Signed)
Transition Care Management Follow-up Telephone Call Date of discharge and from where: [place] 02-18-21 Dx: acute diastolic heart failure How have you been since you were released from the hospital? Doing ok Any questions or concerns? No  Items Reviewed: Did the pt receive and understand the discharge instructions provided? Yes  Medications obtained and verified? Yes  Other? No  Any new allergies since your discharge? No  Dietary orders reviewed? Yes Do you have support at home? Yes   Home Care and Equipment/Supplies: Were home health services ordered? Yes PT If so, what is the name of the agency? [facility]  Has the agency set up a time to come to the patient's home? yes Were any new equipment or medical supplies ordered?  No What is the name of the medical supply agency? [facility] Were you able to get the supplies/equipment? not applicable Do you have any questions related to the use of the equipment or supplies? No  Functional Questionnaire: (I = Independent and D = Dependent) ADLs: D  Bathing/Dressing- D  Meal Prep- D  Eating- I  Maintaining continence- I  Transferring/Ambulation- I  Managing Meds- D  Follow up appointments reviewed:  PCP Hospital f/u appt confirmed? Yes  Scheduled to see Dr Brand Males  on 02-24-21 @ [facility] f/u appt confirmed? Yes  Scheduled to see Cardio on 02-28-21 @ noon. Are transportation arrangements needed? No  If their condition worsens, is the pt aware to call PCP or go to the Emergency Dept.? Yes Was the patient provided with contact information for the PCP's office or ED? Yes Was to pt encouraged to call back with questions or concerns? Yes

## 2021-02-23 DIAGNOSIS — E1142 Type 2 diabetes mellitus with diabetic polyneuropathy: Secondary | ICD-10-CM | POA: Diagnosis not present

## 2021-02-23 DIAGNOSIS — L97512 Non-pressure chronic ulcer of other part of right foot with fat layer exposed: Secondary | ICD-10-CM | POA: Diagnosis not present

## 2021-02-23 DIAGNOSIS — D649 Anemia, unspecified: Secondary | ICD-10-CM | POA: Diagnosis not present

## 2021-02-23 DIAGNOSIS — M720 Palmar fascial fibromatosis [Dupuytren]: Secondary | ICD-10-CM | POA: Diagnosis not present

## 2021-02-23 DIAGNOSIS — E11621 Type 2 diabetes mellitus with foot ulcer: Secondary | ICD-10-CM | POA: Diagnosis not present

## 2021-02-23 DIAGNOSIS — F17210 Nicotine dependence, cigarettes, uncomplicated: Secondary | ICD-10-CM | POA: Diagnosis not present

## 2021-02-23 DIAGNOSIS — K5904 Chronic idiopathic constipation: Secondary | ICD-10-CM | POA: Diagnosis not present

## 2021-02-23 DIAGNOSIS — Z7901 Long term (current) use of anticoagulants: Secondary | ICD-10-CM | POA: Diagnosis not present

## 2021-02-23 DIAGNOSIS — I480 Paroxysmal atrial fibrillation: Secondary | ICD-10-CM | POA: Diagnosis not present

## 2021-02-23 DIAGNOSIS — N401 Enlarged prostate with lower urinary tract symptoms: Secondary | ICD-10-CM | POA: Diagnosis not present

## 2021-02-23 DIAGNOSIS — E559 Vitamin D deficiency, unspecified: Secondary | ICD-10-CM | POA: Diagnosis not present

## 2021-02-23 DIAGNOSIS — E1151 Type 2 diabetes mellitus with diabetic peripheral angiopathy without gangrene: Secondary | ICD-10-CM | POA: Diagnosis not present

## 2021-02-23 DIAGNOSIS — E113299 Type 2 diabetes mellitus with mild nonproliferative diabetic retinopathy without macular edema, unspecified eye: Secondary | ICD-10-CM | POA: Diagnosis not present

## 2021-02-23 DIAGNOSIS — F3341 Major depressive disorder, recurrent, in partial remission: Secondary | ICD-10-CM | POA: Diagnosis not present

## 2021-02-23 DIAGNOSIS — I10 Essential (primary) hypertension: Secondary | ICD-10-CM | POA: Diagnosis not present

## 2021-02-23 DIAGNOSIS — L409 Psoriasis, unspecified: Secondary | ICD-10-CM | POA: Diagnosis not present

## 2021-02-23 DIAGNOSIS — N138 Other obstructive and reflux uropathy: Secondary | ICD-10-CM | POA: Diagnosis not present

## 2021-02-23 DIAGNOSIS — M1990 Unspecified osteoarthritis, unspecified site: Secondary | ICD-10-CM | POA: Diagnosis not present

## 2021-02-23 DIAGNOSIS — M47812 Spondylosis without myelopathy or radiculopathy, cervical region: Secondary | ICD-10-CM | POA: Diagnosis not present

## 2021-02-23 DIAGNOSIS — I25812 Atherosclerosis of bypass graft of coronary artery of transplanted heart without angina pectoris: Secondary | ICD-10-CM | POA: Diagnosis not present

## 2021-02-23 DIAGNOSIS — Z7982 Long term (current) use of aspirin: Secondary | ICD-10-CM | POA: Diagnosis not present

## 2021-02-23 DIAGNOSIS — E785 Hyperlipidemia, unspecified: Secondary | ICD-10-CM | POA: Diagnosis not present

## 2021-02-23 DIAGNOSIS — F419 Anxiety disorder, unspecified: Secondary | ICD-10-CM | POA: Diagnosis not present

## 2021-02-23 DIAGNOSIS — G8929 Other chronic pain: Secondary | ICD-10-CM | POA: Diagnosis not present

## 2021-02-23 NOTE — Progress Notes (Signed)
**Pt left appointment prior to imaging and prior to being seen by MD due to illness**    [facility] Note  03/01/2021     CHIEF COMPLAINT Patient presents for No chief complaint on file.  HISTORY OF PRESENT ILLNESS: [name] [name] is a 66 y.o. male who presents to the clinic today for:       Referring physician: Janith Lima, MD [place],  [place] [address]  HISTORICAL INFORMATION:   Selected notes from the MEDICAL RECORD NUMBER Referred by Dr. [name] for concern of PDR OU   CURRENT MEDICATIONS: No current outpatient medications on file. (Ophthalmic Drugs)   No current facility-administered medications for this visit. (Ophthalmic Drugs)   Current Outpatient Medications (Other)  Medication Sig   apixaban (ELIQUIS) 5 MG TABS tablet Take 1 tablet (5 mg total) by mouth 2 (two) times daily.   aspirin EC 81 MG EC tablet Take 1 tablet (81 mg total) by mouth daily.   atorvastatin (LIPITOR) 40 MG tablet Take 1 tablet (40 mg total) by mouth daily.   B Complex Vitamins (B COMPLEX-B12 PO) Take 1 tablet by mouth daily.    BD PEN NEEDLE NANO 2ND GEN 32G X 4 MM MISC USE FOR INSULIN   Blood Glucose Monitoring Suppl (ONETOUCH VERIO FLEX SYSTEM) w/Device KIT    Cholecalciferol (VITAMIN D) 125 MCG (5000 UT) CAPS Take 5,000 Units by mouth daily.    Continuous Blood Gluc Sensor (DEXCOM G6 SENSOR) MISC 1 Device by Does not apply route See admin instructions. Change every 10 days   Continuous Blood Gluc Transmit (DEXCOM G6 TRANSMITTER) MISC Use as instructed to check blood sugar   furosemide (LASIX) 40 MG tablet Take 1 tablet (40 mg total) by mouth daily.   Insulin Disposable Pump (OMNIPOD 5 G6 INTRO, GEN 5,) KIT 1 Device by Does not apply route every 3 (three) days.   Lancets (ONETOUCH DELICA PLUS [id number]) MISC Apply 1 each topically 4 (four) times daily.   losartan (COZAAR) 25 MG tablet Take 1 tablet (25 mg total) by mouth daily.    mirtazapine (REMERON) 15 MG tablet Take 1 tablet (15 mg total) by mouth at bedtime.   ONETOUCH VERIO test strip 1 each 4 (four) times daily.   PARoxetine (PAXIL) 20 MG tablet Take 1 tablet (20 mg total) by mouth daily.   spironolactone (ALDACTONE) 25 MG tablet Take 1 tablet (25 mg total) by mouth daily.   No current facility-administered medications for this visit. (Other)   REVIEW OF SYSTEMS:   ALLERGIES No Known Allergies  PAST MEDICAL HISTORY Past Medical History:  Diagnosis Date   Anxiety    Arthritis    Cataract    Constipation    pt states goes EOD- hard stools    Coronary artery disease    Depression    Diabetes mellitus without complication (HCC)    Diabetic retinopathy ([place])    DKA (diabetic ketoacidoses)    Hyperlipidemia    Hypertension    off meds   Hypertensive retinopathy    Psoriasis    Past Surgical History:  Procedure Laterality Date   ABDOMINAL AORTOGRAM W/LOWER EXTREMITY N/A 08/30/2020   Procedure: ABDOMINAL AORTOGRAM W/LOWER EXTREMITY;  Surgeon: Serafina Mitchell, MD;  Location: [facility];  Service: Cardiovascular;  Laterality: N/A;   ABDOMINAL AORTOGRAM W/LOWER EXTREMITY N/A 11/15/2020   Procedure: ABDOMINAL AORTOGRAM W/LOWER EXTREMITY;  Surgeon: Serafina Mitchell, MD;  Location: [facility];  Service: Cardiovascular;  Laterality: N/A;   ANKLE SURGERY     right   CARDIAC CATHETERIZATION  09/25/2001   COLONOSCOPY     ~10 yr ago- normal  per pt    CORONARY ARTERY BYPASS GRAFT  09/30/2001   CABG x 4   IR THORACENTESIS ASP PLEURAL SPACE W/IMG GUIDE  05/19/2020   LAPAROSCOPIC INGUINAL HERNIA REPAIR Bilateral 02/09/2010   MASS EXCISION Left 03/12/2013   Procedure: LEFT INDEX EXCISION MASS AND DEBRIDMENT DISTAL INTERPHALANGEAL JOINT;  Surgeon: Tennis Must, MD;  Location: [facility];  Service: Orthopedics;  Laterality: Left;   PERIPHERAL VASCULAR BALLOON ANGIOPLASTY Left 08/30/2020   Procedure: PERIPHERAL VASCULAR BALLOON  ANGIOPLASTY;  Surgeon: Serafina Mitchell, MD;  Location: [facility];  Service: Cardiovascular;  Laterality: Left;  Peroneal   FAMILY HISTORY Family History  Problem Relation Age of Onset   Lymphoma Father    Cancer Paternal Grandmother        Colon Cancer   Colon cancer Paternal Grandmother    Colon polyps Neg Hx    Esophageal cancer Neg Hx    Rectal cancer Neg Hx    Stomach cancer Neg Hx    Diabetes Mellitus I Neg Hx    SOCIAL HISTORY Social History   Tobacco Use   Smoking status: Some Days    Types: Cigars   Smokeless tobacco: Never  Vaping Use   Vaping Use: Never used  Substance Use Topics   Alcohol use: Yes    Comment: daily beer or scotch    Drug use: No       OPHTHALMIC EXAM: Not recorded    IMAGING AND PROCEDURES  Imaging and Procedures for 03/01/2021          ASSESSMENT/PLAN:  No diagnosis found.   1. Severe proliferative diabetic retinopathy, both eyes  - s/p PRP OS (11.10.22)  - s/p PRP OD (12.02.22), PRP fill in (01.31.23)             - s/p IVA OD #1 (1.27.23)  - s/p IVA OS #1 (01.18.23) for VH - A1c 7.1% on 1.12.23; >15% on 10.11.22 - pt reports improved BG control with changes in insulin regimen, insulin pump being shipped - exam shows extensive neovascularization, fibrosis and hemorrhage OU  - OD with regressing NVD s/p IVA OD #1 - OS w/ mild improvement in [facility] --- clearing superiorly and settling inferiorly - BCVA 20/30 OD - improved from 20/100, CF 2' OS - FA (11.09.22) shows areas of vascular non-perfusion OU, +NVD OU and +NVE OU - OCT shows OD: Partial PVD, interval improvement in IRF greatest temporal macula and fovea, interval improvement in NV on en face images; OS: no interpretable images obtained today - discussed findings, severity of his disease, prognosis and likely need for extensive treatments -- PRP OU, multiple rounds of anti-VEGF therapy OU and possible surgery - RBA of procedure discussed, questions answered - informed  consent obtained and signed, 01.18.23 - see procedure note - F/u February 15, DFE, OCT, possible injection(s)  2. Vitreous hemorrhage OS  - acute increase in [facility] OS (late Dec 2022) per pt report  - s/p IVA OS #1, 01.18.23 as above  - VH improving - VH precautions reviewed -- minimize activities, keep head elevated, avoid ASA/NSAIDs/blood thinners as able - f/u February 15 DFE, OCT  3,4. Hypertensive retinopathy OU - discussed importance of tight BP control - monitor   5. Mixed Cataract OU - The symptoms of  cataract, surgical options, and treatments and risks were discussed with patient - discussed diagnosis and progression - not yet visually significant - monitor for now   Ophthalmic Meds Ordered this visit:  No orders of the defined types were placed in this encounter.    No follow-ups on file.  There are no Patient Instructions on file for this visit.  Explained the diagnoses, plan, and follow up with the patient and they expressed understanding.  Patient expressed understanding of the importance of proper follow up care.   This document serves as a record of services personally performed by [name], MD, PhD. It was created on their behalf by Orvan Falconer, an ophthalmic technician. The creation of this record is the provider's dictation and/or activities during the visit.    Electronically signed by: Orvan Falconer, OA, 02/23/21  3:18 PM    [name], M.D., Ph.D. Diseases & Surgery of the Retina and Vitreous [facility]  I have reviewed the above documentation for accuracy and completeness, and I agree with the above. [name], M.D., Ph.D. 01/31/21 3:18 PM   Abbreviations: M myopia (nearsighted); A astigmatism; H hyperopia (farsighted); P presbyopia; Mrx spectacle prescription;  CTL contact lenses; OD right eye; OS left eye; OU both eyes  XT exotropia; ET esotropia; PEK punctate epithelial keratitis; PEE punctate  epithelial erosions; DES dry eye syndrome; MGD meibomian gland dysfunction; ATs artificial tears; PFAT's preservative free artificial tears; [place] nuclear sclerotic cataract; PSC posterior subcapsular cataract; ERM epi-retinal membrane; PVD posterior vitreous detachment; RD retinal detachment; DM diabetes mellitus; DR diabetic retinopathy; NPDR non-proliferative diabetic retinopathy; PDR proliferative diabetic retinopathy; CSME clinically significant macular edema; DME diabetic macular edema; dbh dot blot hemorrhages; CWS cotton wool spot; POAG primary open angle glaucoma; C/D cup-to-disc ratio; HVF humphrey visual field; GVF goldmann visual field; OCT optical coherence tomography; IOP intraocular pressure; BRVO Branch retinal vein occlusion; CRVO central retinal vein occlusion; CRAO central retinal artery occlusion; BRAO branch retinal artery occlusion; RT retinal tear; SB scleral buckle; PPV pars plana vitrectomy; VH Vitreous hemorrhage; PRP panretinal laser photocoagulation; IVK intravitreal kenalog; VMT vitreomacular traction; MH Macular hole;  NVD neovascularization of the disc; NVE neovascularization elsewhere; AREDS age related eye disease study; ARMD age related macular degeneration; POAG primary open angle glaucoma; EBMD epithelial/anterior basement membrane dystrophy; ACIOL anterior chamber intraocular lens; IOL intraocular lens; PCIOL posterior chamber intraocular lens; Phaco/IOL phacoemulsification with intraocular lens placement; [place] photorefractive keratectomy; LASIK laser assisted in situ keratomileusis; HTN hypertension; DM diabetes mellitus; COPD chronic obstructive pulmonary disease

## 2021-02-24 ENCOUNTER — Ambulatory Visit (INDEPENDENT_AMBULATORY_CARE_PROVIDER_SITE_OTHER): Payer: HMO | Admitting: Physician Assistant

## 2021-02-24 ENCOUNTER — Other Ambulatory Visit: Payer: Self-pay

## 2021-02-24 VITALS — BP 101/62 | HR 73 | Temp 97.8°F | Ht 74.0 in | Wt 171.2 lb

## 2021-02-24 DIAGNOSIS — E108 Type 1 diabetes mellitus with unspecified complications: Secondary | ICD-10-CM

## 2021-02-24 DIAGNOSIS — R159 Full incontinence of feces: Secondary | ICD-10-CM | POA: Diagnosis not present

## 2021-02-24 DIAGNOSIS — K529 Noninfective gastroenteritis and colitis, unspecified: Secondary | ICD-10-CM

## 2021-02-24 DIAGNOSIS — I480 Paroxysmal atrial fibrillation: Secondary | ICD-10-CM

## 2021-02-24 DIAGNOSIS — I5031 Acute diastolic (congestive) heart failure: Secondary | ICD-10-CM | POA: Diagnosis not present

## 2021-02-24 DIAGNOSIS — I25812 Atherosclerosis of bypass graft of coronary artery of transplanted heart without angina pectoris: Secondary | ICD-10-CM | POA: Diagnosis not present

## 2021-02-24 MED ORDER — DISPOSABLE BRIEF LARGE MISC: 1.0000 | 5 refills | Status: DC | PRN

## 2021-02-24 MED ORDER — LOPERAMIDE HCL 2 MG PO TABS: 2.0000 mg | ORAL_TABLET | Freq: Four times a day (QID) | ORAL | 2 refills | Status: DC | PRN

## 2021-02-24 NOTE — Progress Notes (Signed)
Subjective:    Patient ID: [name] [name], male    DOB: 10/17/1955, 66 y.o.   MRN: [id number]  Chief Complaint  Patient presents with   Hospitalization Follow-up    HPI Patient is in today for hospital follow-up.  The patient was admitted to [facility] from 02/14/2021 to 02/18/2021.  He was admitted for new diagnosis of acute exacerbation of congestive heart failure.  He was diuresed with Lasix and had good response.  He was transitioned to oral Lasix 40 mg daily, losartan, Aldactone.  He is scheduled to follow-up with Dr. Audie Box, cardiologist.  States that he is sleeping better at night now. No chest pain or palpitations. Feels like his breathing is a lot better now, no complaints.  During his hospital admission he also reported having chronic diarrhea.  He tells me today that he is completely incontinent of stool and sometimes does not even know when it is happening.  He is frustrated with a lot of cleaning up at home.  He was referred to gastroenterology, but says that he has not heard about a referral yet.     Past Medical History:  Diagnosis Date   Anxiety    Arthritis    Cataract    Constipation    pt states goes EOD- hard stools    Coronary artery disease    Depression    Diabetes mellitus without complication (HCC)    Diabetic retinopathy ([place])    DKA (diabetic ketoacidoses)    Hyperlipidemia    Hypertension    off meds   Hypertensive retinopathy    Psoriasis     Past Surgical History:  Procedure Laterality Date   ABDOMINAL AORTOGRAM W/LOWER EXTREMITY N/A 08/30/2020   Procedure: ABDOMINAL AORTOGRAM W/LOWER EXTREMITY;  Surgeon: Serafina Mitchell, MD;  Location: [facility];  Service: Cardiovascular;  Laterality: N/A;   ABDOMINAL AORTOGRAM W/LOWER EXTREMITY N/A 11/15/2020   Procedure: ABDOMINAL AORTOGRAM W/LOWER EXTREMITY;  Surgeon: Serafina Mitchell, MD;  Location: [facility];  Service: Cardiovascular;  Laterality: N/A;   ANKLE  SURGERY     right   CARDIAC CATHETERIZATION  09/25/2001   COLONOSCOPY     ~10 yr ago- normal  per pt    CORONARY ARTERY BYPASS GRAFT  09/30/2001   CABG x 4   IR THORACENTESIS ASP PLEURAL SPACE W/IMG GUIDE  05/19/2020   LAPAROSCOPIC INGUINAL HERNIA REPAIR Bilateral 02/09/2010   MASS EXCISION Left 03/12/2013   Procedure: LEFT INDEX EXCISION MASS AND DEBRIDMENT DISTAL INTERPHALANGEAL JOINT;  Surgeon: Tennis Must, MD;  Location: [facility];  Service: Orthopedics;  Laterality: Left;   PERIPHERAL VASCULAR BALLOON ANGIOPLASTY Left 08/30/2020   Procedure: PERIPHERAL VASCULAR BALLOON ANGIOPLASTY;  Surgeon: Serafina Mitchell, MD;  Location: [facility];  Service: Cardiovascular;  Laterality: Left;  Peroneal    Family History  Problem Relation Age of Onset   Lymphoma Father    Cancer Paternal Grandmother        Colon Cancer   Colon cancer Paternal Grandmother    Colon polyps Neg Hx    Esophageal cancer Neg Hx    Rectal cancer Neg Hx    Stomach cancer Neg Hx    Diabetes Mellitus I Neg Hx     Social History   Tobacco Use   Smoking status: Some Days    Types: Cigars   Smokeless tobacco: Never  Vaping Use   Vaping Use: Never used  Substance Use Topics  Alcohol use: Yes    Comment: daily beer or scotch    Drug use: No     No Known Allergies  Review of Systems NEGATIVE UNLESS OTHERWISE INDICATED IN HPI      Objective:     BP 101/62    Pulse 73    Temp 97.8 F (36.6 C)    Ht 6\' 2"  (1.88 m)    Wt 171 lb 4 oz (77.7 kg)    SpO2 96%    BMI 21.99 kg/m   Wt Readings from Last 3 Encounters:  02/24/21 171 lb 4 oz (77.7 kg)  02/18/21 171 lb 4.8 oz (77.7 kg)  02/13/21 194 lb 4 oz (88.1 kg)    BP Readings from Last 3 Encounters:  02/24/21 101/62  02/18/21 (!) 149/88  02/13/21 (!) 153/81     Physical Exam Vitals and nursing note reviewed.  Constitutional:      General: He is not in acute distress.    Appearance: He is cachectic. He is not toxic-appearing.      Comments: Using a rolling walker. He is wearing winter gloves and when I asked him about this, he says that his hands always stay cold.  He denies any history of Raynaud's.  HENT:     Head: Normocephalic and atraumatic.     Right Ear: External ear normal.     Left Ear: External ear normal.     Nose: Nose normal.     Mouth/Throat:     Mouth: Mucous membranes are moist.     Pharynx: Oropharynx is clear.  Eyes:     Extraocular Movements: Extraocular movements intact.     Conjunctiva/sclera: Conjunctivae normal.     Pupils: Pupils are equal, round, and reactive to light.  Cardiovascular:     Rate and Rhythm: Normal rate and regular rhythm.     Pulses: Normal pulses.     Heart sounds: Normal heart sounds.  Pulmonary:     Effort: Pulmonary effort is normal.  Musculoskeletal:        General: Normal range of motion.     Cervical back: Normal range of motion and neck supple.     Right lower leg: No edema.     Left lower leg: No edema.  Skin:    General: Skin is warm and dry.  Neurological:     General: No focal deficit present.     Mental Status: He is alert and oriented to person, place, and time.  Psychiatric:        Mood and Affect: Mood normal.        Behavior: Behavior normal.       Assessment & Plan:   Problem List Items Addressed This Visit       Cardiovascular and Mediastinum   Coronary artery disease involving bypass graft of transplanted heart without angina pectoris   Relevant Orders   Basic Metabolic Panel (BMET)   Paroxysmal atrial fibrillation (HCC)   Relevant Orders   Basic Metabolic Panel (BMET)     Digestive   Chronic diarrhea   Relevant Medications   Incontinence Supply Disposable (DISPOSABLE BRIEF LARGE) MISC   Other Relevant Orders   Basic Metabolic Panel (BMET)   Other Visit Diagnoses     Acute diastolic congestive heart failure (HCC)    -  Primary   Type 1 diabetes mellitus with complications ([place])       Relevant Orders   Basic Metabolic  Panel (BMET)   Incontinence of feces, unspecified fecal  incontinence type       Relevant Medications   Incontinence Supply Disposable (DISPOSABLE BRIEF LARGE) MISC        Meds ordered this encounter  Medications   Incontinence Supply Disposable (DISPOSABLE BRIEF LARGE) MISC    Sig: 1 each by Does not apply route as needed.    Dispense:  36 each    Refill:  5   loperamide (IMODIUM A-D) 2 MG tablet    Sig: Take 1 tablet (2 mg total) by mouth 4 (four) times daily as needed for diarrhea or loose stools.    Dispense:  30 tablet    Refill:  2    Plan: -BMP today -Medications reconciled with patient -I personally reviewed his hospital records, labs, and imaging -Rx for disposable underwear, Imodium prn; he will f/up with GI -F/up with cardiology as scheduled  F/up with me in 3 months or prn   Oaklyn Mans M Caylan Chenard, PA-C

## 2021-02-24 NOTE — Patient Instructions (Addendum)
Good to see you today. BMP in office. Continue current medications as directed.  Please proceed with follow up with cardiologist and gastroenterology.  I have sent Imodium to your pharmacy as well as disposable underwear.

## 2021-02-25 DIAGNOSIS — S42102D Fracture of unspecified part of scapula, left shoulder, subsequent encounter for fracture with routine healing: Secondary | ICD-10-CM | POA: Diagnosis not present

## 2021-02-25 DIAGNOSIS — L89150 Pressure ulcer of sacral region, unstageable: Secondary | ICD-10-CM | POA: Diagnosis not present

## 2021-02-27 ENCOUNTER — Encounter (HOSPITAL_COMMUNITY): Payer: HMO

## 2021-02-27 NOTE — Progress Notes (Incomplete)
HEART & VASCULAR TRANSITION OF CARE CONSULT NOTE     Referring Physician: Primary Care: Primary Cardiologist:  HPI: Referred to clinic by *** for heart failure consultation.   Cardiac Testing    Review of Systems: [y] = yes, [ ]  = no   General: Weight gain [ ] ; Weight loss [ ] ; Anorexia [ ] ; Fatigue [ ] ; Fever [ ] ; Chills [ ] ; Weakness [ ]   Cardiac: Chest pain/pressure [ ] ; Resting SOB [ ] ; Exertional SOB [ ] ; Orthopnea [ ] ; Pedal Edema [ ] ; Palpitations [ ] ; Syncope [ ] ; Presyncope [ ] ; Paroxysmal nocturnal dyspnea[ ]   Pulmonary: Cough [ ] ; Wheezing[ ] ; Hemoptysis[ ] ; Sputum [ ] ; Snoring [ ]   GI: Vomiting[ ] ; Dysphagia[ ] ; Melena[ ] ; Hematochezia [ ] ; Heartburn[ ] ; Abdominal pain [ ] ; Constipation [ ] ; Diarrhea [ ] ; BRBPR [ ]   GU: Hematuria[ ] ; Dysuria [ ] ; Nocturia[ ]   Vascular: Pain in legs with walking [ ] ; Pain in feet with lying flat [ ] ; Non-healing sores [ ] ; Stroke [ ] ; TIA [ ] ; Slurred speech [ ] ;  Neuro: Headaches[ ] ; Vertigo[ ] ; Seizures[ ] ; Paresthesias[ ] ;Blurred vision [ ] ; Diplopia [ ] ; Vision changes [ ]   Ortho/Skin: Arthritis [ ] ; Joint pain [ ] ; Muscle pain [ ] ; Joint swelling [ ] ; Back Pain [ ] ; Rash [ ]   Psych: Depression[ ] ; Anxiety[ ]   Heme: Bleeding problems [ ] ; Clotting disorders [ ] ; Anemia [ ]   Endocrine: Diabetes [ ] ; Thyroid dysfunction[ ]    Past Medical History:  Diagnosis Date   Anxiety    Arthritis    Cataract    Constipation    pt states goes EOD- hard stools    Coronary artery disease    Depression    Diabetes mellitus without complication (HCC)    Diabetic retinopathy (HCC)    DKA (diabetic ketoacidoses)    Hyperlipidemia    Hypertension    off meds   Hypertensive retinopathy    Psoriasis     Current Outpatient Medications  Medication Sig Dispense Refill   apixaban (ELIQUIS) 5 MG TABS tablet Take 1 tablet (5 mg total) by mouth 2 (two) times daily. 60 tablet 2   aspirin EC 81 MG EC tablet Take 1 tablet (81 mg total) by mouth  daily. 30 tablet 0   atorvastatin (LIPITOR) 40 MG tablet Take 1 tablet (40 mg total) by mouth daily. 90 tablet 1   B Complex Vitamins (B COMPLEX-B12 PO) Take 1 tablet by mouth daily.      BD PEN NEEDLE NANO 2ND GEN 32G X 4 MM MISC USE FOR INSULIN 300 each 1   Blood Glucose Monitoring Suppl (ONETOUCH VERIO FLEX SYSTEM) w/Device KIT      Cholecalciferol (VITAMIN D) 125 MCG (5000 UT) CAPS Take 5,000 Units by mouth daily.      Continuous Blood Gluc Sensor (DEXCOM G6 SENSOR) MISC 1 Device by Does not apply route See admin instructions. Change every 10 days 9 each 3   Continuous Blood Gluc Transmit (DEXCOM G6 TRANSMITTER) MISC Use as instructed to check blood sugar 1 each 1   furosemide (LASIX) 40 MG tablet Take 1 tablet (40 mg total) by mouth daily. 30 tablet 0   Incontinence Supply Disposable (DISPOSABLE BRIEF LARGE) MISC 1 each by Does not apply route as needed. 36 each 5   Insulin Disposable Pump (OMNIPOD 5 G6 INTRO, GEN 5,) KIT 1 Device by Does not apply route every 3 (three) days. 30 kit  3   Lancets (ONETOUCH DELICA PLUS [id number]) MISC Apply 1 each topically 4 (four) times daily.     loperamide (IMODIUM A-D) 2 MG tablet Take 1 tablet (2 mg total) by mouth 4 (four) times daily as needed for diarrhea or loose stools. 30 tablet 2   losartan (COZAAR) 25 MG tablet Take 1 tablet (25 mg total) by mouth daily. 30 tablet 0   mirtazapine (REMERON) 15 MG tablet Take 1 tablet (15 mg total) by mouth at bedtime. 90 tablet 1   ONETOUCH VERIO test strip 1 each 4 (four) times daily.     PARoxetine (PAXIL) 20 MG tablet Take 1 tablet (20 mg total) by mouth daily. 90 tablet 1   spironolactone (ALDACTONE) 25 MG tablet Take 1 tablet (25 mg total) by mouth daily. 30 tablet 0   No current facility-administered medications for this visit.    No Known Allergies    Social History   Socioeconomic History   Marital status: Divorced    Spouse name: Not on file   Number of children: 4   Years of education: 12    Highest education level: 12th grade  Occupational History   Occupation: Retired  Tobacco Use   Smoking status: Some Days    Types: Cigars   Smokeless tobacco: Never  Vaping Use   Vaping Use: Never used  Substance and Sexual Activity   Alcohol use: Yes    Comment: daily beer or scotch    Drug use: No   Sexual activity: Yes  Other Topics Concern   Not on file  Social History Narrative   Lives with his adult son [name].   Has 4 children, 2 sons- both live in [place], 2 daughters one lives in [place], [place] and youngest daughter attends [employer].    Fiance lives in  [place], [place]   Social Determinants of Health   Financial Resource Strain: Not on file  Food Insecurity: Not on file  Transportation Needs: Not on file  Physical Activity: Not on file  Stress: Not on file  Social Connections: Not on file  Intimate Partner Violence: Not on file      Family History  Problem Relation Age of Onset   Lymphoma Father    Cancer Paternal Grandmother        Colon Cancer   Colon cancer Paternal Grandmother    Colon polyps Neg Hx    Esophageal cancer Neg Hx    Rectal cancer Neg Hx    Stomach cancer Neg Hx    Diabetes Mellitus I Neg Hx     There were no vitals filed for this visit.  PHYSICAL EXAM: General:  Well appearing. No respiratory difficulty HEENT: normal Neck: supple. no JVD. Carotids 2+ bilat; no bruits. No lymphadenopathy or thryomegaly appreciated. Cor: PMI nondisplaced. Regular rate & rhythm. No rubs, gallops or murmurs. Lungs: clear Abdomen: soft, nontender, nondistended. No hepatosplenomegaly. No bruits or masses. Good bowel sounds. Extremities: no cyanosis, clubbing, rash, edema Neuro: alert & oriented x 3, cranial nerves grossly intact. moves all 4 extremities w/o difficulty. Affect pleasant.  ECG:   ASSESSMENT & PLAN:  NYHA *** GDMT  Diuretic- BB- Ace/ARB/ARNI MRA SGLT2i    Referred to HFSW (PCP, Medications, Transportation,  ETOH Abuse, Drug Abuse, Insurance, Financial ): Yes or No Refer to Pharmacy: Yes or No Refer to Home Health: Yes on No Refer to Advanced Heart Failure Clinic: Yes or no  Refer to General Cardiology: Yes or No  Follow up

## 2021-02-28 ENCOUNTER — Encounter (HOSPITAL_COMMUNITY): Payer: HMO

## 2021-02-28 ENCOUNTER — Telehealth (HOSPITAL_COMMUNITY): Payer: Self-pay

## 2021-02-28 NOTE — Telephone Encounter (Signed)
Call attempted to confirm HV TOC appt today, 2/28 @ 12pm. HIPPA appropriate VM left on mother cell per comment notes with callback number. Second Cellphone went straight to vm.  Pricilla Holm, MSN, RN Heart Failure Nurse Navigator [phone]

## 2021-03-01 ENCOUNTER — Telehealth: Payer: Self-pay

## 2021-03-01 ENCOUNTER — Encounter (INDEPENDENT_AMBULATORY_CARE_PROVIDER_SITE_OTHER): Payer: Self-pay

## 2021-03-01 ENCOUNTER — Encounter (INDEPENDENT_AMBULATORY_CARE_PROVIDER_SITE_OTHER): Payer: Self-pay | Admitting: Ophthalmology

## 2021-03-01 ENCOUNTER — Telehealth: Payer: Self-pay | Admitting: Physician Assistant

## 2021-03-01 ENCOUNTER — Encounter (INDEPENDENT_AMBULATORY_CARE_PROVIDER_SITE_OTHER): Payer: HMO | Admitting: Ophthalmology

## 2021-03-01 ENCOUNTER — Other Ambulatory Visit: Payer: Self-pay

## 2021-03-01 DIAGNOSIS — D649 Anemia, unspecified: Secondary | ICD-10-CM | POA: Diagnosis not present

## 2021-03-01 DIAGNOSIS — L409 Psoriasis, unspecified: Secondary | ICD-10-CM | POA: Diagnosis not present

## 2021-03-01 DIAGNOSIS — N401 Enlarged prostate with lower urinary tract symptoms: Secondary | ICD-10-CM | POA: Diagnosis not present

## 2021-03-01 DIAGNOSIS — E113299 Type 2 diabetes mellitus with mild nonproliferative diabetic retinopathy without macular edema, unspecified eye: Secondary | ICD-10-CM | POA: Diagnosis not present

## 2021-03-01 DIAGNOSIS — N138 Other obstructive and reflux uropathy: Secondary | ICD-10-CM | POA: Diagnosis not present

## 2021-03-01 DIAGNOSIS — F17210 Nicotine dependence, cigarettes, uncomplicated: Secondary | ICD-10-CM | POA: Diagnosis not present

## 2021-03-01 DIAGNOSIS — F3341 Major depressive disorder, recurrent, in partial remission: Secondary | ICD-10-CM | POA: Diagnosis not present

## 2021-03-01 DIAGNOSIS — L97512 Non-pressure chronic ulcer of other part of right foot with fat layer exposed: Secondary | ICD-10-CM | POA: Diagnosis not present

## 2021-03-01 DIAGNOSIS — E113513 Type 2 diabetes mellitus with proliferative diabetic retinopathy with macular edema, bilateral: Secondary | ICD-10-CM

## 2021-03-01 DIAGNOSIS — M1990 Unspecified osteoarthritis, unspecified site: Secondary | ICD-10-CM | POA: Diagnosis not present

## 2021-03-01 DIAGNOSIS — I25812 Atherosclerosis of bypass graft of coronary artery of transplanted heart without angina pectoris: Secondary | ICD-10-CM | POA: Diagnosis not present

## 2021-03-01 DIAGNOSIS — H25813 Combined forms of age-related cataract, bilateral: Secondary | ICD-10-CM

## 2021-03-01 DIAGNOSIS — M720 Palmar fascial fibromatosis [Dupuytren]: Secondary | ICD-10-CM | POA: Diagnosis not present

## 2021-03-01 DIAGNOSIS — H35033 Hypertensive retinopathy, bilateral: Secondary | ICD-10-CM

## 2021-03-01 DIAGNOSIS — F419 Anxiety disorder, unspecified: Secondary | ICD-10-CM | POA: Diagnosis not present

## 2021-03-01 DIAGNOSIS — I10 Essential (primary) hypertension: Secondary | ICD-10-CM | POA: Diagnosis not present

## 2021-03-01 DIAGNOSIS — Z7982 Long term (current) use of aspirin: Secondary | ICD-10-CM | POA: Diagnosis not present

## 2021-03-01 DIAGNOSIS — K5904 Chronic idiopathic constipation: Secondary | ICD-10-CM | POA: Diagnosis not present

## 2021-03-01 DIAGNOSIS — M47812 Spondylosis without myelopathy or radiculopathy, cervical region: Secondary | ICD-10-CM | POA: Diagnosis not present

## 2021-03-01 DIAGNOSIS — E785 Hyperlipidemia, unspecified: Secondary | ICD-10-CM | POA: Diagnosis not present

## 2021-03-01 DIAGNOSIS — E1142 Type 2 diabetes mellitus with diabetic polyneuropathy: Secondary | ICD-10-CM | POA: Diagnosis not present

## 2021-03-01 DIAGNOSIS — G8929 Other chronic pain: Secondary | ICD-10-CM | POA: Diagnosis not present

## 2021-03-01 DIAGNOSIS — E11621 Type 2 diabetes mellitus with foot ulcer: Secondary | ICD-10-CM | POA: Diagnosis not present

## 2021-03-01 DIAGNOSIS — E559 Vitamin D deficiency, unspecified: Secondary | ICD-10-CM | POA: Diagnosis not present

## 2021-03-01 DIAGNOSIS — H4312 Vitreous hemorrhage, left eye: Secondary | ICD-10-CM

## 2021-03-01 DIAGNOSIS — E1151 Type 2 diabetes mellitus with diabetic peripheral angiopathy without gangrene: Secondary | ICD-10-CM | POA: Diagnosis not present

## 2021-03-01 DIAGNOSIS — Z7901 Long term (current) use of anticoagulants: Secondary | ICD-10-CM | POA: Diagnosis not present

## 2021-03-01 DIAGNOSIS — I480 Paroxysmal atrial fibrillation: Secondary | ICD-10-CM | POA: Diagnosis not present

## 2021-03-01 NOTE — Progress Notes (Signed)
This encounter was created in error - please disregard.

## 2021-03-01 NOTE — Telephone Encounter (Signed)
Weight: ?2/27 173.6 ?2/28 170.6 ?3/1 165.2  ? ?BP today 90/52 ? ?C/O dizzy and vomiting  ? ?Spoke directly to [place] with home care on the phone who directed patient to go to ER. Home health nurse advised she will let patient know.  ?

## 2021-03-02 ENCOUNTER — Encounter (HOSPITAL_BASED_OUTPATIENT_CLINIC_OR_DEPARTMENT_OTHER): Payer: HMO | Attending: Internal Medicine | Admitting: General Surgery

## 2021-03-06 ENCOUNTER — Encounter: Payer: Self-pay | Admitting: Podiatry

## 2021-03-06 ENCOUNTER — Ambulatory Visit (INDEPENDENT_AMBULATORY_CARE_PROVIDER_SITE_OTHER): Payer: HMO | Admitting: Podiatry

## 2021-03-06 ENCOUNTER — Other Ambulatory Visit: Payer: Self-pay

## 2021-03-06 DIAGNOSIS — E109 Type 1 diabetes mellitus without complications: Secondary | ICD-10-CM | POA: Diagnosis not present

## 2021-03-06 DIAGNOSIS — I739 Peripheral vascular disease, unspecified: Secondary | ICD-10-CM | POA: Diagnosis not present

## 2021-03-06 DIAGNOSIS — M79675 Pain in left toe(s): Secondary | ICD-10-CM

## 2021-03-06 DIAGNOSIS — B351 Tinea unguium: Secondary | ICD-10-CM | POA: Diagnosis not present

## 2021-03-06 DIAGNOSIS — M79674 Pain in right toe(s): Secondary | ICD-10-CM | POA: Diagnosis not present

## 2021-03-06 NOTE — Progress Notes (Unsigned)
Office Visit    Patient Name: [name] [name] Date of Encounter: 03/06/2021  Primary Care Provider:  [name], [name], PA-C Primary Cardiologist:  Evalina Field, MD  Chief Complaint    66 year old male with a history of CAD s/p CABG x3 in 2003, aortic aneurysm s/p repair in 9244, chronic diastolic heart failure, paroxysmal atrial fibrillation on Eliquis, PVD, hypertension, hyperlipidemia, and type 2 diabetes on insulin pump presents for follow-up post hospitalization related to heart failure.  Past Medical History    Past Medical History:  Diagnosis Date   Anxiety    Arthritis    Cataract    CHF (congestive heart failure) (HCC)    Constipation    pt states goes EOD- hard stools    Coronary artery disease    Depression    Diabetes mellitus without complication (HCC)    Diabetic retinopathy ([place])    DKA (diabetic ketoacidoses)    Hyperlipidemia    Hypertension    off meds   Hypertensive retinopathy    Psoriasis    Past Surgical History:  Procedure Laterality Date   ABDOMINAL AORTOGRAM W/LOWER EXTREMITY N/A 08/30/2020   Procedure: ABDOMINAL AORTOGRAM W/LOWER EXTREMITY;  Surgeon: Serafina Mitchell, MD;  Location: [facility];  Service: Cardiovascular;  Laterality: N/A;   ABDOMINAL AORTOGRAM W/LOWER EXTREMITY N/A 11/15/2020   Procedure: ABDOMINAL AORTOGRAM W/LOWER EXTREMITY;  Surgeon: Serafina Mitchell, MD;  Location: [facility];  Service: Cardiovascular;  Laterality: N/A;   ANKLE SURGERY     right   CARDIAC CATHETERIZATION  09/25/2001   COLONOSCOPY     ~10 yr ago- normal  per pt    CORONARY ARTERY BYPASS GRAFT  09/30/2001   CABG x 4   IR THORACENTESIS ASP PLEURAL SPACE W/IMG GUIDE  05/19/2020   LAPAROSCOPIC INGUINAL HERNIA REPAIR Bilateral 02/09/2010   MASS EXCISION Left 03/12/2013   Procedure: LEFT INDEX EXCISION MASS AND DEBRIDMENT DISTAL INTERPHALANGEAL JOINT;  Surgeon: Tennis Must, MD;  Location: [facility];  Service: Orthopedics;   Laterality: Left;   PERIPHERAL VASCULAR BALLOON ANGIOPLASTY Left 08/30/2020   Procedure: PERIPHERAL VASCULAR BALLOON ANGIOPLASTY;  Surgeon: Serafina Mitchell, MD;  Location: [facility];  Service: Cardiovascular;  Laterality: Left;  Peroneal    Allergies  No Known Allergies  History of Present Illness    66 year old male with the above past medical history including CAD s/p CABG x3 in 2003, aortic aneurysm s/p repair in 6286, chronic diastolic heart failure, paroxysmal atrial fibrillation on Eliquis, PVD, hypertension, hyperlipidemia, and type 2 diabetes on insulin pump.   History of CAD s/p CABG x3 in 2003.  He has a history of aortic aneurysm repair in 2003 as well.  He last saw cardiologist in the outpatient setting in 2014.  History of PAD followed by vascular surgery, he underwent balloon angioplasty of right posterior tibial artery in November 2022.  Abdominal aortogram, with lower extremity duplex at the time showed recurrent stenosis within the left peroneal artery.  Recommended consideration for repeat balloon angioplasty if further difficulty with wound healing.  He saw his PCP on 02/13/2021 with complaints of chest pain, he was found to be in atrial fibrillation. He complained of shortness of breath, weight gain, and was advised to go to the ED for further evaluation. EKG showed rate controlled atrial fibrillation, anteroseptal infarct, age undetermined.  Chest x-ray showed new moderate left and small right pleural effusions, pulmonary vascular congestion consistent with new congestive heart  failure.  He was hospitalized from 02/14/2021 to 02/18/2021.  Cardiology was consulted.  Echocardiogram showed EF 55 to 60%, no RWMA, mild LVH, DD.  BNP was mildly elevated at 187.  He was diuresed with IV Lasix.  He was started on Eliquis for paroxysmal atrial fibrillation.  He did have some bradycardia on telemetry while in the hospital, it was recommended he avoid any rate lowering/AV nodal blocking  medications. He was discharged home in stable condition on 02/18/2021 on Lasix, losartan, spironolactone.  He presents today for follow-up.  Since his hospitalization  Chronic diastolic heart failure: On Lasix, losartan, prolactin.  Consider addition of SGLT2 inhibitor. Paroxysmal atrial fibrillation: On Eliquis. CHA2DS2-VASc Score = 5.  CAD: S/p CABG x3 in 2003. Hypertension: Hyperlipidemia: LDL 34 on 02/16/2021. Iron deficiency anemia: Hemoglobin 10.9.  Follow-up recommended with GI. Disposition:  Additionally, has chronic diarrhea, iron deficiency anemia.  Outpatient follow-up with GI recommended. Home Medications    Current Outpatient Medications  Medication Sig Dispense Refill   apixaban (ELIQUIS) 5 MG TABS tablet Take 1 tablet (5 mg total) by mouth 2 (two) times daily. 60 tablet 2   aspirin EC 81 MG EC tablet Take 1 tablet (81 mg total) by mouth daily. 30 tablet 0   atorvastatin (LIPITOR) 40 MG tablet Take 1 tablet (40 mg total) by mouth daily. 90 tablet 1   B Complex Vitamins (B COMPLEX-B12 PO) Take 1 tablet by mouth daily.      BD PEN NEEDLE NANO 2ND GEN 32G X 4 MM MISC USE FOR INSULIN 300 each 1   Blood Glucose Monitoring Suppl (ONETOUCH VERIO FLEX SYSTEM) w/Device KIT      Cholecalciferol (VITAMIN D) 125 MCG (5000 UT) CAPS Take 5,000 Units by mouth daily.      Continuous Blood Gluc Sensor (DEXCOM G6 SENSOR) MISC 1 Device by Does not apply route See admin instructions. Change every 10 days 9 each 3   Continuous Blood Gluc Transmit (DEXCOM G6 TRANSMITTER) MISC Use as instructed to check blood sugar 1 each 1   furosemide (LASIX) 40 MG tablet Take 1 tablet (40 mg total) by mouth daily. 30 tablet 0   Incontinence Supply Disposable (DISPOSABLE BRIEF LARGE) MISC 1 each by Does not apply route as needed. 36 each 5   Insulin Disposable Pump (OMNIPOD 5 G6 INTRO, GEN 5,) KIT 1 Device by Does not apply route every 3 (three) days. 30 kit 3   Lancets (ONETOUCH DELICA PLUS [id number]) MISC  Apply 1 each topically 4 (four) times daily.     loperamide (IMODIUM A-D) 2 MG tablet Take 1 tablet (2 mg total) by mouth 4 (four) times daily as needed for diarrhea or loose stools. 30 tablet 2   losartan (COZAAR) 25 MG tablet Take 1 tablet (25 mg total) by mouth daily. 30 tablet 0   mirtazapine (REMERON) 15 MG tablet Take 1 tablet (15 mg total) by mouth at bedtime. 90 tablet 1   ONETOUCH VERIO test strip 1 each 4 (four) times daily.     PARoxetine (PAXIL) 20 MG tablet Take 1 tablet (20 mg total) by mouth daily. 90 tablet 1   spironolactone (ALDACTONE) 25 MG tablet Take 1 tablet (25 mg total) by mouth daily. 30 tablet 0   No current facility-administered medications for this visit.     Review of Systems    ***.  All other systems reviewed and are otherwise negative except as noted above.    Physical Exam    VS:  There  were no vitals taken for this visit. , BMI There is no height or weight on file to calculate BMI.     GEN: Well nourished, well developed, in no acute distress. HEENT: normal. Neck: Supple, no JVD, carotid bruits, or masses. Cardiac: RRR, no murmurs, rubs, or gallops. No clubbing, cyanosis, edema.  Radials/DP/PT 2+ and equal bilaterally.  Respiratory:  Respirations regular and unlabored, clear to auscultation bilaterally. GI: Soft, nontender, nondistended, BS + x 4. MS: no deformity or atrophy. Skin: warm and dry, no rash. Neuro:  Strength and sensation are intact. Psych: Normal affect.  Accessory Clinical Findings    ECG personally reviewed by me today - *** - no acute changes.  Lab Results  Component Value Date   WBC 4.6 02/17/2021   HGB 10.9 (L) 02/17/2021   HCT 34.1 (L) 02/17/2021   MCV 95.0 02/17/2021   PLT 155 02/17/2021   Lab Results  Component Value Date   CREATININE 0.74 02/17/2021   BUN 22 02/17/2021   NA 138 02/17/2021   K 4.0 02/17/2021   CL 100 02/17/2021   CO2 30 02/17/2021   Lab Results  Component Value Date   ALT 13 02/16/2021    AST 16 02/16/2021   ALKPHOS 55 02/16/2021   BILITOT 0.8 02/16/2021   Lab Results  Component Value Date   CHOL 78 02/16/2021   HDL 39 (L) 02/16/2021   LDLCALC 34 02/16/2021   LDLDIRECT 37.0 03/16/2020   TRIG 26 02/16/2021   CHOLHDL 2.0 02/16/2021    Lab Results  Component Value Date   HGBA1C 7.1 (A) 01/12/2021    Assessment & Plan    1.  ***   Lenna Sciara, NP 03/06/2021, 1:42 PM

## 2021-03-06 NOTE — Progress Notes (Signed)
This patient returns to my office for at risk foot care.  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  This patient is unable to cut nails himself since the patient cannot reach his nails.These nails are painful walking and wearing shoes. Patient has ulcer treated right foot by wound center. This patient presents for at risk foot care today. ? ?General Appearance  Alert, conversant and in no acute stress. ? ?Vascular  Dorsalis pedis and posterior tibial  pulses are palpable  bilaterally.  Capillary return is within normal limits  bilaterally. Temperature is within normal limits  bilaterally. ? ?Neurologic  Senn-Weinstein monofilament wire test within normal limits  bilaterally. Muscle power within normal limits bilaterally. ? ?Nails Thick disfigured discolored nails with subungual debris  from hallux to fifth toes bilaterally. No evidence of bacterial infection or drainage bilaterally. ? ?Orthopedic  No limitations of motion  feet .  No crepitus or effusions noted.  No bony pathology or digital deformities noted. ? ?Skin  normotropic skin with no porokeratosis noted bilaterally.  No signs of infections or ulcers noted.    ? ?Onychomycosis  Pain in right toes  Pain in left toes ? ?Consent was obtained for treatment procedures.   Mechanical debridement of nails 1-5  bilaterally performed with a nail nipper.  Filed with dremel without incident.  ? ? ?Return office visit   4 months                   Told patient to return for periodic foot care and evaluation due to potential at risk complications. ? ? ?Gardiner Barefoot DPM   ?

## 2021-03-07 ENCOUNTER — Ambulatory Visit: Payer: HMO | Admitting: Nurse Practitioner

## 2021-03-13 ENCOUNTER — Other Ambulatory Visit: Payer: Self-pay

## 2021-03-13 ENCOUNTER — Ambulatory Visit (HOSPITAL_COMMUNITY)
Admission: RE | Admit: 2021-03-13 | Discharge: 2021-03-13 | Disposition: A | Discharge status: Home / Self Care | Payer: HMO | Source: Ambulatory Visit | Attending: Cardiology | Admitting: Cardiology

## 2021-03-13 VITALS — BP 106/54 | HR 72 | Ht 74.0 in | Wt 171.8 lb

## 2021-03-13 DIAGNOSIS — K529 Noninfective gastroenteritis and colitis, unspecified: Secondary | ICD-10-CM | POA: Insufficient documentation

## 2021-03-13 DIAGNOSIS — R001 Bradycardia, unspecified: Secondary | ICD-10-CM | POA: Diagnosis not present

## 2021-03-13 DIAGNOSIS — I5032 Chronic diastolic (congestive) heart failure: Secondary | ICD-10-CM | POA: Diagnosis not present

## 2021-03-13 DIAGNOSIS — Z794 Long term (current) use of insulin: Secondary | ICD-10-CM | POA: Diagnosis not present

## 2021-03-13 DIAGNOSIS — Z951 Presence of aortocoronary bypass graft: Secondary | ICD-10-CM | POA: Diagnosis not present

## 2021-03-13 DIAGNOSIS — I110 Hypertensive heart disease with heart failure: Secondary | ICD-10-CM | POA: Insufficient documentation

## 2021-03-13 DIAGNOSIS — J90 Pleural effusion, not elsewhere classified: Secondary | ICD-10-CM | POA: Diagnosis not present

## 2021-03-13 DIAGNOSIS — I459 Conduction disorder, unspecified: Secondary | ICD-10-CM | POA: Diagnosis not present

## 2021-03-13 DIAGNOSIS — Z79899 Other long term (current) drug therapy: Secondary | ICD-10-CM | POA: Insufficient documentation

## 2021-03-13 DIAGNOSIS — I4819 Other persistent atrial fibrillation: Secondary | ICD-10-CM

## 2021-03-13 DIAGNOSIS — Z8679 Personal history of other diseases of the circulatory system: Secondary | ICD-10-CM | POA: Insufficient documentation

## 2021-03-13 DIAGNOSIS — Z9641 Presence of insulin pump (external) (internal): Secondary | ICD-10-CM | POA: Diagnosis not present

## 2021-03-13 DIAGNOSIS — I480 Paroxysmal atrial fibrillation: Secondary | ICD-10-CM

## 2021-03-13 DIAGNOSIS — E1036 Type 1 diabetes mellitus with diabetic cataract: Secondary | ICD-10-CM | POA: Diagnosis not present

## 2021-03-13 DIAGNOSIS — R0683 Snoring: Secondary | ICD-10-CM | POA: Diagnosis not present

## 2021-03-13 DIAGNOSIS — I2510 Atherosclerotic heart disease of native coronary artery without angina pectoris: Secondary | ICD-10-CM | POA: Insufficient documentation

## 2021-03-13 DIAGNOSIS — R9431 Abnormal electrocardiogram [ECG] [EKG]: Secondary | ICD-10-CM | POA: Insufficient documentation

## 2021-03-13 DIAGNOSIS — Z7901 Long term (current) use of anticoagulants: Secondary | ICD-10-CM | POA: Diagnosis not present

## 2021-03-13 NOTE — Progress Notes (Signed)
HEART & VASCULAR TRANSITION OF CARE CONSULT NOTE    Referring Physician: Dr. Harriet Masson  Primary Care: [name], [name], PA-C  Primary Cardiologist: Dr. Harriet Masson   HPI: Referred to clinic by Dr. Harriet Masson for heart failure consultation.   66 y/o male w/ CAD + h/o aortic dissection s/p CABGx 3 + Aortic Aneurysm repair in 2003, PAF on Eliquis, h/o bradycardia, Type 1DM on insulin pump and HTN. Has not seen a cardiologist in several years. He reports his PCP diagnosed him w/ Afib several months ago and placed him on Eliquis. No attempted cardioversion. He also has a h/o pleural effusions, required thoracentesis 05/2020.   Recently admitted 2/23 for dyspnea. Found to be in acute CHF and in atrial fibrillation. CXR showed pleural effusions. He was diuresed w/ IV Lasix. Cardiology notes says that he reverted back to NSR. He was not placed on AV nodal blocking agents given noted bradycardia, w/ rates as low as the 40s. Echo showed normal LVEF 55-60%, no regional wall motion abnormalities, mild LVH, grade I diastolic dysfunction, RV mildly reduced. Of note, he was also mentioned to have chronic diarrhea. Placed on imodium. Referred to outpatient GI.   From cardiac standpoint, he was placed on lasix, losartan, spiro, ASA, Lipitor and Eliquis. Referred to TOC at d/c. D/c wt 170 lb   Presents today for f/u. Here w/ his mother. He is back in Afib w/ CVR in the 60s. Asymptomatic. Wt stable since d/c at 171 lb. Not very active at baseline, ambulates w/ walker but no exertional dyspnea w/ ADLs. Denies orthopnea/PND. No CP. Continues w/ chronic diarrhea. Has appt w/ GI later this week. Denies neuropathy symptoms. Reports h/o snoring. Has never had a sleep study.   Today, ReDs Clip 30%.    Cardiac Testing   Echo 02/15/21   1. Left ventricular ejection fraction, by estimation, is 55 to 60%. The  left ventricle has normal function. The left ventricle has no regional  wall motion abnormalities. There is mild left  ventricular hypertrophy.  Left ventricular diastolic parameters  are consistent with Grade I diastolic dysfunction (impaired relaxation).   2. Right ventricular systolic function is mildly reduced. The right ventricular size is normal.   3. Left atrial size was mildly dilated.   4. The mitral valve is normal in structure. Mild mitral valve  regurgitation. No evidence of mitral stenosis.   5. The aortic valve is tricuspid. Aortic valve regurgitation is not  visualized. Aortic valve is calcified. In the parasternal long axis,  cannot rule out some mobility with question of small vegetation. Not noted  in other views. Clinical correlation  suggested.   6. The inferior vena cava is dilated in size with >50% respiratory  variability, suggesting right atrial pressure of 8 mmHg.   7. Left pleural effusion noted.     Review of Systems: [y] = yes, _0  = no   General: Weight gain _1 ; Weight loss _2 ; Anorexia _3 ; Fatigue _4 ; Fever _5 ; Chills [Y ]; Weakness _6   Cardiac: Chest pain/pressure _7 ; Resting SOB _8 ; Exertional SOB _9 ; Orthopnea _10 ; Pedal Edema _11 ; Palpitations _12 ; Syncope _13 ; Presyncope _14 ; Paroxysmal nocturnal dyspnea_15   Pulmonary: Cough _16 ; Wheezing_17 ; Hemoptysis_18 ; Sputum _19 ; Snoring _20   GI: Vomiting_21 ; Dysphagia_22 ; Melena_23 ; Hematochezia _24 ; Heartburn_25 ; Abdominal pain _26 ; Constipation _27 ; Diarrhea [Y ]; BRBPR _28   GU:  Hematuria_0 ; Dysuria _1 ; Nocturia_2   Vascular: Pain in legs with walking _3 ; Pain in feet with lying flat _4 ; Non-healing sores _5 ; Stroke _6 ; TIA _7 ; Slurred speech _8 ;  Neuro: Headaches_9 ; Vertigo_10 ; Seizures_11 ; Paresthesias_12 ;Blurred vision _13 ; Diplopia _14 ; Vision changes _15   Ortho/Skin: Arthritis _16 ; Joint pain _17 ; Muscle pain _18 ; Joint swelling _19 ; Back Pain _20 ; Rash _21   Psych: Depression_22 ; Anxiety_23   Heme: Bleeding problems _24 ; Clotting disorders _25 ; Anemia _26   Endocrine: Diabetes [ Y]; Thyroid dysfunction[  ]   Past Medical History:  Diagnosis Date   Anxiety    Arthritis    Cataract    CHF (congestive heart failure) ([place])    Constipation    pt states goes EOD- hard stools    Coronary artery disease    Depression    Diabetes mellitus without complication (HCC)    Diabetic retinopathy ([place])    DKA (diabetic ketoacidoses)    Hyperlipidemia    Hypertension    off meds   Hypertensive retinopathy    Psoriasis    Tubular adenoma of colon 03/2019    Current Outpatient Medications  Medication Sig Dispense Refill   apixaban (ELIQUIS) 5 MG TABS tablet Take 1 tablet (5 mg total) by mouth 2 (two) times daily. 60 tablet 2   aspirin EC 81 MG EC tablet Take 1 tablet (81 mg total) by mouth daily. 30 tablet 0   atorvastatin (LIPITOR) 40 MG tablet Take 1 tablet (40 mg total) by mouth daily. 90 tablet 1   B Complex Vitamins (B COMPLEX-B12 PO) Take 1 tablet by mouth daily.      BD PEN NEEDLE NANO 2ND GEN 32G X 4 MM MISC USE FOR INSULIN 300 each 1   Blood Glucose Monitoring Suppl (ONETOUCH VERIO FLEX SYSTEM) w/Device KIT      Cholecalciferol (VITAMIN D) 125 MCG (5000 UT) CAPS Take 5,000 Units by mouth daily.      Continuous Blood Gluc Sensor (DEXCOM G6 SENSOR) MISC 1 Device by Does not apply route See admin instructions. Change every 10 days 9 each 3   Continuous Blood Gluc Transmit (DEXCOM G6 TRANSMITTER) MISC Use as instructed to check blood sugar 1 each 1   furosemide (LASIX) 40 MG tablet Take 1 tablet (40 mg total) by mouth daily. 30 tablet 0   Incontinence Supply Disposable (DISPOSABLE BRIEF LARGE) MISC 1 each by Does not apply route as needed. 36 each 5   Insulin Disposable Pump (OMNIPOD 5 G6 INTRO, GEN 5,) KIT 1 Device by Does not apply route every 3 (three) days. 30 kit 3   Lancets (ONETOUCH DELICA PLUS [id number]) MISC Apply 1 each topically 4 (four) times daily.     loperamide (IMODIUM A-D) 2 MG tablet Take 1 tablet (2 mg total) by mouth 4 (four) times daily as needed for diarrhea or loose  stools. 30 tablet 2   losartan (COZAAR) 25 MG tablet Take 1 tablet (25 mg total) by mouth daily. 30 tablet 0   mirtazapine (REMERON) 15 MG tablet Take 1 tablet (15 mg total) by mouth at bedtime. 90 tablet 1   ONETOUCH VERIO test strip 1 each 4 (four) times daily.     PARoxetine (PAXIL) 20 MG tablet Take 1 tablet (20 mg total) by mouth daily. 90 tablet 1   spironolactone (ALDACTONE) 25 MG tablet Take 1 tablet (25 mg total) by  mouth daily. 30 tablet 0   No current facility-administered medications for this encounter.    No Known Allergies    Social History   Socioeconomic History   Marital status: Divorced    Spouse name: Not on file   Number of children: 4   Years of education: 12   Highest education level: 12th grade  Occupational History   Occupation: Retired  Tobacco Use   Smoking status: Some Days    Types: Cigars   Smokeless tobacco: Never  Vaping Use   Vaping Use: Never used  Substance and Sexual Activity   Alcohol use: Yes    Comment: daily beer or scotch    Drug use: No   Sexual activity: Yes  Other Topics Concern   Not on file  Social History Narrative   Lives with his adult son [name].   Has 4 children, 2 sons- both live in [place], 2 daughters one lives in [place], [place] and youngest daughter attends [employer].    Fiance lives in  [place], [place]   Social Determinants of Health   Financial Resource Strain: Not on file  Food Insecurity: Not on file  Transportation Needs: Not on file  Physical Activity: Not on file  Stress: Not on file  Social Connections: Not on file  Intimate Partner Violence: Not on file      Family History  Problem Relation Age of Onset   Lymphoma Father    Cancer Paternal Grandmother        Colon Cancer   Colon cancer Paternal Grandmother    Colon polyps Neg Hx    Esophageal cancer Neg Hx    Rectal cancer Neg Hx    Stomach cancer Neg Hx    Diabetes Mellitus I Neg Hx     Vitals:   03/13/21 1009  BP:  (!) 106/54  Pulse: 72  SpO2: 99%  Weight: 77.9 kg  Height: _0  (1.88 m)    PHYSICAL EXAM: ReDS Clip 30%  General:  Well appearing. No respiratory difficulty HEENT: normal Neck: supple. no JVD. Carotids 2+ bilat; no bruits. No lymphadenopathy or thryomegaly appreciated. Cor: PMI nondisplaced. Irregularly irregular rhythm. No rubs, gallops or murmurs. Lungs: clear Abdomen: soft, nontender, nondistended. No hepatosplenomegaly. No bruits or masses. Good bowel sounds. Extremities: no cyanosis, clubbing, rash, trace b/l ankle edema Neuro: alert & oriented x 3, cranial nerves grossly intact. moves all 4 extremities w/o difficulty. Affect pleasant.  ECG: Atrial Fibrillation 68 bpm, personally reviewed    ASSESSMENT & PLAN:  1. Chronic Diastolic Heart Failure - Echo 2/23: EF 50-60%, G1DD, RV mildly reduced  - NYHA Class II. Euvolemic on exam and by ReDs Clip, 30%  - Continue Lasix 40 mg daily  - Continue Losartan 25 mg daily  - Continue Spiro 25 mg daily  - No ? blocker w/ bradycardia - No SGLT2i w/ Type 1DM - Recommended BMP to check renal fx/K given meds. Pt adamantly refused  - Given diastolic dysfunction, conduction disease w/ Afib and bradycardia, h/o pleural effusions and chronic diarrhea, consider PYP scan to assess for TTR amyloid. Morphology on echo not c/w AL Amyloid.   2. Persistent Atrial Fibrillation  - rate controlled, not on AV nodal blocking agents given h/o bradycardia - on Eliquis 5 mg bid - he thinks he has missed a dose or 2 in the last month. Advised strict compliance - refer to cardiology. Recommend attempt at DCCV in 4 weeks  - he should also be referred for  sleep study given h/o snoring   3. CAD - s/p CABG x 3 in 2003 - denies ischemic CP - will need to re-establish care w/ a cardiologist. Will refer to [facility]   4. H/o Aortic Dissection - s/p repair w/ CABG in 2003  - followed by Dr. Trula Slade  - on statin   5. Type 1DM - insulin pump - per  PCP  6. Chronic Diarrhea - has consultation w/ GI this week    NYHA II GDMT  Diuretic- Lasix 40 mg daily  BB- N/A bradycardia  Ace/ARB/ARNI Losartan 25 mg  MRA Spiro 25 mg daily  SGLT2i No Type 1DM     Referred to HFSW (PCP, Medications, Transportation, ETOH Abuse, Drug Abuse, Insurance, Museum/gallery curator ): No Refer to Pharmacy:  No Refer to Home Health:  No Refer to Advanced Heart Failure Clinic: No  Refer to General Cardiology: Yes   Follow up w/ Cardiology. Recommend DCCV, PYP scan and Sleep study.

## 2021-03-13 NOTE — Progress Notes (Signed)
Patient left TOC clinic prior to CSW completing [facility] screen. No concerns raised during visit. Raquel Sarna, [place], [place] ? ?

## 2021-03-13 NOTE — Progress Notes (Signed)
ReDS Vest / Clip - 03/13/21 1009   ? ?  ? ReDS Vest / Clip  ? Station Marker C   ? Ruler Value 28   ? ReDS Value Range Low volume   ? ReDS Actual Value 30   ? ?  ?  ? ?  ? ? ?

## 2021-03-13 NOTE — Patient Instructions (Addendum)
It was great to see you today! ?No medication changes are needed at this time. ? ? ?You have been referred to [facility] ?-they will be in contact with an appointment ? ? ? ? ? ? ? ? ?

## 2021-03-13 NOTE — Addendum Note (Signed)
Encounter addended by: Consuelo Pandy, PA-C on: 03/13/2021 2:15 PM ? Actions taken: Level of Service modified

## 2021-03-15 ENCOUNTER — Ambulatory Visit: Payer: HMO | Admitting: Gastroenterology

## 2021-03-15 NOTE — Progress Notes (Signed)
?[facility] Note ? ?03/17/2021 ? ?  ? ?CHIEF COMPLAINT ?Patient presents for Retina Follow Up ? ?HISTORY OF PRESENT ILLNESS: ?[name] [name] is a 66 y.o. male who presents to the clinic today for:  ? ?HPI   ? ? Retina Follow Up   ?Patient presents with  Diabetic Retinopathy.  In both eyes.  This started 6 weeks ago.  I, the attending physician,  performed the HPI with the patient and updated documentation appropriately. ? ?  ?  ? ? Comments   ?Patient here for 6 weeks retina follow up for PDR OU. Patient states vision dong terrible. Can't see out of OS. Can see color and blobs. That is it. OD much better but could be improved. No eye pain.  ? ?  ?  ?Last edited by [name], MD on 03/18/2021 11:36 AM.  ?  ?Pt states right eye vision is "pretty good, distance wise", left eye he can only see "color and blobs" ? ?Referring physician: ?Janith Lima, MD ?[place],  [place] [address] ? ?HISTORICAL INFORMATION:  ? ?Selected notes from the [facility] ?Referred by Dr. [name] for concern of PDR OU  ? ?CURRENT MEDICATIONS: ?No current outpatient medications on file. (Ophthalmic Drugs)  ? ?No current facility-administered medications for this visit. (Ophthalmic Drugs)  ? ?Current Outpatient Medications (Other)  ?Medication Sig  ? apixaban (ELIQUIS) 5 MG TABS tablet Take 1 tablet (5 mg total) by mouth 2 (two) times daily.  ? aspirin EC 81 MG EC tablet Take 1 tablet (81 mg total) by mouth daily.  ? atorvastatin (LIPITOR) 40 MG tablet Take 1 tablet (40 mg total) by mouth daily.  ? B Complex Vitamins (B COMPLEX-B12 PO) Take 1 tablet by mouth daily.   ? BD PEN NEEDLE NANO 2ND GEN 32G X 4 MM MISC USE FOR INSULIN  ? Blood Glucose Monitoring Suppl ([place]) w/Device KIT   ? Cholecalciferol (VITAMIN D) 125 MCG (5000 UT) CAPS Take 5,000 Units by mouth daily.   ? Continuous Blood Gluc Sensor (DEXCOM G6 SENSOR) MISC 1 Device by Does not apply route  See admin instructions. Change every 10 days  ? Continuous Blood Gluc Transmit (DEXCOM G6 TRANSMITTER) MISC Use as instructed to check blood sugar  ? furosemide (LASIX) 40 MG tablet Take 1 tablet (40 mg total) by mouth daily.  ? Incontinence Supply Disposable (DISPOSABLE BRIEF LARGE) MISC 1 each by Does not apply route as needed.  ? Insulin Disposable Pump (OMNIPOD 5 G6 INTRO, GEN 5,) KIT 1 Device by Does not apply route every 3 (three) days.  ? Lancets (ONETOUCH DELICA PLUS [id number]) MISC Apply 1 each topically 4 (four) times daily.  ? loperamide (IMODIUM A-D) 2 MG tablet Take 1 tablet (2 mg total) by mouth 4 (four) times daily as needed for diarrhea or loose stools.  ? losartan (COZAAR) 25 MG tablet Take 1 tablet (25 mg total) by mouth daily.  ? mirtazapine (REMERON) 15 MG tablet Take 1 tablet (15 mg total) by mouth at bedtime.  ? ONETOUCH VERIO test strip 1 each 4 (four) times daily.  ? PARoxetine (PAXIL) 20 MG tablet Take 1 tablet (20 mg total) by mouth daily.  ? spironolactone (ALDACTONE) 25 MG tablet Take 1 tablet (25 mg total) by mouth daily.  ? ?No current facility-administered medications for this visit. (Other)  ? ?REVIEW OF SYSTEMS: ?ROS   ?Positive for: Neurological, Skin, Musculoskeletal, Endocrine,  Cardiovascular, Eyes ?Negative for: Constitutional, Gastrointestinal, Genitourinary, HENT, Respiratory, Psychiatric, Allergic/Imm, Heme/Lymph ?Last edited by Theodore Demark, COA on 03/17/2021  8:48 AM.  ?  ? ?ALLERGIES ?No Known Allergies ? ?PAST MEDICAL HISTORY ?Past Medical History:  ?Diagnosis Date  ? Anxiety   ? Arthritis   ? Cataract   ? CHF (congestive heart failure) ([place])   ? Constipation   ? pt states goes EOD- hard stools   ? Coronary artery disease   ? Depression   ? Diabetes mellitus without complication ([place])   ? Diabetic retinopathy ([place])   ? DKA (diabetic ketoacidoses)   ? Hyperlipidemia   ? Hypertension   ? off meds  ? Hypertensive retinopathy   ? Psoriasis   ? Tubular adenoma of colon  03/2019  ? ?Past Surgical History:  ?Procedure Laterality Date  ? ABDOMINAL AORTOGRAM W/LOWER EXTREMITY N/A 08/30/2020  ? Procedure: ABDOMINAL AORTOGRAM W/LOWER EXTREMITY;  Surgeon: Serafina Mitchell, MD;  Location: [facility];  Service: Cardiovascular;  Laterality: N/A;  ? ABDOMINAL AORTOGRAM W/LOWER EXTREMITY N/A 11/15/2020  ? Procedure: ABDOMINAL AORTOGRAM W/LOWER EXTREMITY;  Surgeon: Serafina Mitchell, MD;  Location: [facility];  Service: Cardiovascular;  Laterality: N/A;  ? ANKLE SURGERY    ? right  ? CARDIAC CATHETERIZATION  09/25/2001  ? COLONOSCOPY    ? ~10 yr ago- normal  per pt   ? CORONARY ARTERY BYPASS GRAFT  09/30/2001  ? CABG x 4  ? IR THORACENTESIS ASP PLEURAL SPACE W/IMG GUIDE  05/19/2020  ? LAPAROSCOPIC INGUINAL HERNIA REPAIR Bilateral 02/09/2010  ? MASS EXCISION Left 03/12/2013  ? Procedure: LEFT INDEX EXCISION MASS AND DEBRIDMENT DISTAL INTERPHALANGEAL JOINT;  Surgeon: Tennis Must, MD;  Location: [facility];  Service: Orthopedics;  Laterality: Left;  ? PERIPHERAL VASCULAR BALLOON ANGIOPLASTY Left 08/30/2020  ? Procedure: PERIPHERAL VASCULAR BALLOON ANGIOPLASTY;  Surgeon: Serafina Mitchell, MD;  Location: [facility];  Service: Cardiovascular;  Laterality: Left;  Peroneal  ? ?FAMILY HISTORY ?Family History  ?Problem Relation Age of Onset  ? Lymphoma Father   ? Cancer Paternal Grandmother   ?     Colon Cancer  ? Colon cancer Paternal Grandmother   ? Colon polyps Neg Hx   ? Esophageal cancer Neg Hx   ? Rectal cancer Neg Hx   ? Stomach cancer Neg Hx   ? Diabetes Mellitus I Neg Hx   ? ?SOCIAL HISTORY ?Social History  ? ?Tobacco Use  ? Smoking status: Some Days  ?  Types: Cigars  ? Smokeless tobacco: Never  ?Vaping Use  ? Vaping Use: Never used  ?Substance Use Topics  ? Alcohol use: Yes  ?  Comment: daily beer or scotch   ? Drug use: No  ?  ? ?  ?OPHTHALMIC EXAM: ?Base Eye Exam   ? ? Visual Acuity (Snellen - Linear)   ? ?   Right Left  ? Dist cc 20/50 CF at 1'  ? Dist ph cc  20/30 -1   ? ? Correction: Glasses  ? ?  ?  ? ? Tonometry (Tonopen, 8:44 AM)   ? ?   Right Left  ? Pressure 10 09  ? ?  ?  ? ? Pupils   ? ?   Dark Light Shape React APD  ? Right 3 2 Round Brisk None  ? Left 3 2 Round Brisk None  ? ?  ?  ? ? Visual Fields (Counting fingers)   ? ?  Left Right  ?  Full Full  ? ?  ?  ? ? Extraocular Movement   ? ?   Right Left  ?  Full, Ortho Full, Ortho  ? ?  ?  ? ? Neuro/Psych   ? ? Oriented x3: Yes  ? Mood/Affect: Normal  ? ?  ?  ? ? Dilation   ? ? Both eyes: 1.0% Mydriacyl, 2.5% Phenylephrine @ 8:44 AM  ? ?  ?  ? ?  ? ?Slit Lamp and Fundus Exam   ? ? External Exam   ? ?   Right Left  ? External Normal Normal  ? ?  ?  ? ? Slit Lamp Exam   ? ?   Right Left  ? Lids/Lashes Dermatochalasis - upper lid, Meibomian gland dysfunction Dermatochalasis - upper lid, Meibomian gland dysfunction  ? Conjunctiva/Sclera White and quiet White and quiet  ? Cornea arcus, 2-3+ Punctate epithelial erosions arcus, 2+ Punctate epithelial erosions  ? Anterior Chamber Deep and quiet Deep and quiet  ? Iris Round and dilated, No NVI Round and dilated, No NVI  ? Lens 2-3+ Nuclear sclerosis with brunescence, 2+ Cortical cataract 2-3+ Nuclear sclerosis with brunescence, 2-3+ Cortical cataract  ? Anterior Vitreous Vitreous syneresis, blood stained vitreous condensations persistent Vitreous syneresis, diffuse VH - concentrated centrally, scattered blood stained vitreous condensations  ? ?  ?  ? ? Fundus Exam   ? ?   Right Left  ? Disc Pink and Sharp, +NVD - regressed obscured by heme  ? C/D Ratio 0.2 0.2  ? Macula Flat, Blunted foveal reflex, scatterd MA, +cystic changes/edema temporal fovea and macula -- slightly improved hazy view, grossly attached, scattered blot hemes  ? Vessels attenuated, Tortuous, +NVE just inside ST arcades - improved attenuated, Tortuous, +NVD; NVE along ST arcades  ? Periphery Attached; 360 DBH, 360 PRP with room for fill in Grossly attached, scattered PRP laser visible  ? ?  ?  ? ?   ? ?Refraction   ? ? Wearing Rx   ? ?   Sphere Cylinder Axis Add  ? Right +0.25 +2.00 180 +2.50  ? Left +0.50 +1.75 009 +2.50  ? ?  ?  ? ?  ? ?IMAGING AND PROCEDURES  ?Imaging and Procedures for 03/17/2021 ? ?O

## 2021-03-17 ENCOUNTER — Encounter (INDEPENDENT_AMBULATORY_CARE_PROVIDER_SITE_OTHER): Payer: Self-pay | Admitting: Ophthalmology

## 2021-03-17 ENCOUNTER — Other Ambulatory Visit: Payer: Self-pay

## 2021-03-17 ENCOUNTER — Ambulatory Visit (INDEPENDENT_AMBULATORY_CARE_PROVIDER_SITE_OTHER): Payer: HMO | Admitting: Ophthalmology

## 2021-03-17 DIAGNOSIS — H35033 Hypertensive retinopathy, bilateral: Secondary | ICD-10-CM | POA: Diagnosis not present

## 2021-03-17 DIAGNOSIS — H25813 Combined forms of age-related cataract, bilateral: Secondary | ICD-10-CM | POA: Diagnosis not present

## 2021-03-17 DIAGNOSIS — H4312 Vitreous hemorrhage, left eye: Secondary | ICD-10-CM

## 2021-03-17 DIAGNOSIS — I10 Essential (primary) hypertension: Secondary | ICD-10-CM

## 2021-03-17 DIAGNOSIS — E113513 Type 2 diabetes mellitus with proliferative diabetic retinopathy with macular edema, bilateral: Secondary | ICD-10-CM | POA: Diagnosis not present

## 2021-03-18 ENCOUNTER — Encounter (INDEPENDENT_AMBULATORY_CARE_PROVIDER_SITE_OTHER): Payer: Self-pay | Admitting: Ophthalmology

## 2021-03-18 MED ORDER — BEVACIZUMAB CHEMO INJECTION 1.25MG/0.05ML SYRINGE FOR KALEIDOSCOPE
1.2500 mg | INTRAVITREAL | Status: AC | PRN
  Administered 2021-03-18: 1.25 mg via INTRAVITREAL

## 2021-03-25 DIAGNOSIS — S42102D Fracture of unspecified part of scapula, left shoulder, subsequent encounter for fracture with routine healing: Secondary | ICD-10-CM | POA: Diagnosis not present

## 2021-03-25 DIAGNOSIS — L89150 Pressure ulcer of sacral region, unstageable: Secondary | ICD-10-CM | POA: Diagnosis not present

## 2021-03-27 ENCOUNTER — Telehealth: Payer: Self-pay | Admitting: Physician Assistant

## 2021-03-27 NOTE — Telephone Encounter (Signed)
.. ?  Encourage patient to contact the pharmacy for refills or they can request refills through [facility] ? ?LAST APPOINTMENT DATE:  02/24/21 ? ?NEXT APPOINTMENT DATE: N/A ? ?MEDICATION:apixaban (ELIQUIS) 5 MG TABS tablet ? ?losartan (COZAAR) 25 MG tablet ? ?spironolactone (ALDACTONE) 25 MG tablet ? ?furosemide (LASIX) 40 MG tablet ? ?Is the patient out of medication? no ? ?PHARMACY: ?[id number] - [place], [place] Phone:  [phone]  ?Fax:  [phone]  ?  ? ? ?Let patient know to contact pharmacy at the end of the day to make sure medication is ready. ? ?Please notify patient to allow 48-72 hours to process  ?

## 2021-03-27 NOTE — Telephone Encounter (Signed)
Pt's mother states these prescriptions were prescribed while pt was in the hospital not by [name].  ?

## 2021-03-28 ENCOUNTER — Telehealth: Payer: Self-pay | Admitting: Physician Assistant

## 2021-03-28 NOTE — Telephone Encounter (Signed)
Left voice message for patient to call clinic.  

## 2021-03-28 NOTE — Telephone Encounter (Signed)
[name] [name] says she was told the form needs to be signed [name] and wants to verify the dosage written on the form. ? ?[place] putting in [place] folder. ? ?Please call when finished ?[phone] ?

## 2021-03-29 ENCOUNTER — Other Ambulatory Visit: Payer: Self-pay

## 2021-03-29 DIAGNOSIS — E785 Hyperlipidemia, unspecified: Secondary | ICD-10-CM

## 2021-03-29 DIAGNOSIS — I480 Paroxysmal atrial fibrillation: Secondary | ICD-10-CM

## 2021-03-29 MED ORDER — SPIRONOLACTONE 25 MG PO TABS: 25.0000 mg | ORAL_TABLET | Freq: Every day | ORAL | 0 refills | Status: DC

## 2021-03-29 MED ORDER — APIXABAN 5 MG PO TABS: 5.0000 mg | ORAL_TABLET | Freq: Two times a day (BID) | ORAL | 2 refills | Status: DC

## 2021-03-29 MED ORDER — ATORVASTATIN CALCIUM 40 MG PO TABS: 40.0000 mg | ORAL_TABLET | Freq: Every day | ORAL | 1 refills | Status: DC

## 2021-03-30 ENCOUNTER — Other Ambulatory Visit: Payer: Self-pay | Admitting: Internal Medicine

## 2021-03-30 ENCOUNTER — Encounter (HOSPITAL_BASED_OUTPATIENT_CLINIC_OR_DEPARTMENT_OTHER): Payer: HMO | Attending: General Surgery | Admitting: General Surgery

## 2021-03-30 DIAGNOSIS — F17290 Nicotine dependence, other tobacco product, uncomplicated: Secondary | ICD-10-CM | POA: Insufficient documentation

## 2021-03-30 DIAGNOSIS — L97511 Non-pressure chronic ulcer of other part of right foot limited to breakdown of skin: Secondary | ICD-10-CM | POA: Insufficient documentation

## 2021-03-30 DIAGNOSIS — E11621 Type 2 diabetes mellitus with foot ulcer: Secondary | ICD-10-CM | POA: Diagnosis not present

## 2021-03-30 DIAGNOSIS — I10 Essential (primary) hypertension: Secondary | ICD-10-CM | POA: Diagnosis not present

## 2021-03-30 DIAGNOSIS — Z8679 Personal history of other diseases of the circulatory system: Secondary | ICD-10-CM | POA: Insufficient documentation

## 2021-03-30 DIAGNOSIS — E1142 Type 2 diabetes mellitus with diabetic polyneuropathy: Secondary | ICD-10-CM | POA: Insufficient documentation

## 2021-03-30 DIAGNOSIS — I2510 Atherosclerotic heart disease of native coronary artery without angina pectoris: Secondary | ICD-10-CM | POA: Diagnosis not present

## 2021-03-30 DIAGNOSIS — E1151 Type 2 diabetes mellitus with diabetic peripheral angiopathy without gangrene: Secondary | ICD-10-CM | POA: Insufficient documentation

## 2021-03-30 DIAGNOSIS — Z951 Presence of aortocoronary bypass graft: Secondary | ICD-10-CM | POA: Insufficient documentation

## 2021-03-30 DIAGNOSIS — Z7901 Long term (current) use of anticoagulants: Secondary | ICD-10-CM | POA: Diagnosis not present

## 2021-03-30 DIAGNOSIS — E785 Hyperlipidemia, unspecified: Secondary | ICD-10-CM | POA: Insufficient documentation

## 2021-03-30 DIAGNOSIS — I480 Paroxysmal atrial fibrillation: Secondary | ICD-10-CM

## 2021-03-30 DIAGNOSIS — L97512 Non-pressure chronic ulcer of other part of right foot with fat layer exposed: Secondary | ICD-10-CM | POA: Diagnosis not present

## 2021-03-30 NOTE — Telephone Encounter (Signed)
Form completed,picked up by patient mother ?

## 2021-03-30 NOTE — Telephone Encounter (Signed)
Rx sent in

## 2021-03-30 NOTE — Progress Notes (Signed)
JAVAUGHN, OPDAHL ([id number]) ?Visit Report for 03/30/2021 ?Arrival Information Details ?Patient Name: Date of Service: ?[name] [name] HNA THA N L. 03/30/2021 8:15 A M ?Medical Record Number: [id number] ?Patient Account Number: [id number] ?Date of Birth/Sex: Treating RN: ?1955-07-22 (66 y.o. [name] [name] ?Primary Care Ottis Sarnowski: [name], Alyssa Other Clinician: ?Referring [place]: ?Treating Zaiya Annunziato/Extender: Fredirick Maudlin ?[name], Alyssa ?Weeks in Treatment: 38 ?Visit Information History Since Last Visit ?Added or deleted any medications: Yes ?Patient Arrived: [name] [name] ?Had a fall or experienced change in No ?Arrival Time: 08:19 ?activities of daily living that may affect ?Accompanied By: mother ?risk of falls: ?Transfer Assistance: None ?Signs or symptoms of abuse/neglect since last visito No ?Patient Requires Transmission-Based Precautions: No ?Hospitalized since last visit: Yes ?Patient Has Alerts: Yes ?Implantable device outside of the clinic excluding No ?Patient Alerts: Patient on Blood Thinner ?cellular tissue based products placed in the center ?since last visit: ?Has Dressing in Place as Prescribed: Yes ?Pain Present Now: Yes ?Electronic Signature(s) ?Signed: 03/30/2021 5:51:22 PM By: Baruch Gouty RN, BSN ?Entered By: Baruch Gouty on 03/30/2021 08:20:04 ?-------------------------------------------------------------------------------- ?Encounter Discharge Information Details ?Patient Name: Date of Service: ?[name] [name] HNA THA N L. 03/30/2021 8:15 A M ?Medical Record Number: [id number] ?Patient Account Number: [id number] ?Date of Birth/Sex: Treating RN: ?03-23-1955 (66 y.o. [name] [name] ?Primary Care Tildon Silveria: [name], Alyssa Other Clinician: ?Referring Carliss Quast: ?Treating Lajoy Vanamburg/Extender: Fredirick Maudlin ?[name], Alyssa ?Weeks in Treatment: 38 ?Encounter Discharge Information Items Post Procedure Vitals ?Discharge Condition: Stable ?Temperature (F): 98.2 ?Ambulatory Status: [name] [name] ?Pulse  (bpm): 59 ?Discharge Destination: Home ?Respiratory Rate (breaths/min): 18 ?Transportation: Private Auto ?Blood Pressure (mmHg): 112/68 ?Accompanied By: mother ?Schedule Follow-up Appointment: Yes ?Clinical Summary of Care: Patient Declined ?Electronic Signature(s) ?Signed: 03/30/2021 5:51:22 PM By: Baruch Gouty RN, BSN ?Entered By: Baruch Gouty on 03/30/2021 09:00:02 ?-------------------------------------------------------------------------------- ?Lower Extremity Assessment Details ?Patient Name: ?Date of Service: ?[name] [name] HNA THA N L. 03/30/2021 8:15 A M ?Medical Record Number: [id number] ?Patient Account Number: [id number] ?Date of Birth/Sex: ?Treating RN: ?1955/10/23 (66 y.o. [name] [name] ?Primary Care Neomi Laidler: [name], Alyssa ?Other Clinician: ?Referring Talor Desrosiers: ?Treating Chakia Counts/Extender: Fredirick Maudlin ?[name], Alyssa ?Weeks in Treatment: 38 ?Edema Assessment ?Assessed: [Left: No] [Right: No] ?Edema: [Left: Ye] [Right: s] ?Calf ?Left: Right: ?Point of Measurement: 34 cm From Medial Instep 35 cm ?Ankle ?Left: Right: ?Point of Measurement: 10 cm From Medial Instep 25 cm ?Vascular Assessment ?Pulses: ?Dorsalis Pedis ?Palpable: [Right:Yes] ?Electronic Signature(s) ?Signed: 03/30/2021 5:51:22 PM By: Baruch Gouty RN, BSN ?Entered By: Baruch Gouty on 03/30/2021 08:30:03 ?-------------------------------------------------------------------------------- ?Multi Wound Chart Details ?Patient Name: ?Date of Service: ?[name] [name] HNA THA N L. 03/30/2021 8:15 A M ?Medical Record Number: [id number] ?Patient Account Number: [id number] ?Date of Birth/Sex: ?Treating RN: ?01-Sep-1955 (66 y.o. [name] [name] ?Primary Care Darshana Curnutt: [name], Alyssa ?Other Clinician: ?Referring Kendyn Zaman: ?Treating Bellanie Matthew/Extender: Fredirick Maudlin ?[name], Alyssa ?Weeks in Treatment: 38 ?Vital Signs ?Height(in): 74 ?Capillary Blood Glucose(mg/dl): 213 ?Weight(lbs): 151 ?Pulse(bpm): 59 ?Body Mass Index(BMI): 19.4 ?Blood  Pressure(mmHg): 112/68 ?Temperature(??F): 98.2 ?Respiratory Rate(breaths/min): 18 ?Photos: [3:Right, Lateral Foot] [N/A:N/A N/A] ?Wound Location: [3:Gradually Appeared] [N/A:N/A] ?Wounding Event: [3:Diabetic Wound/Ulcer of the Lower] [N/A:N/A] ?Primary Etiology: [3:Extremity Coronary Artery Disease,] [N/A:N/A] ?Comorbid History: [3:Hypertension, Type II Diabetes, Osteoarthritis, Neuropathy 06/21/2020] [N/A:N/A] ?Date Acquired: [3:38] [N/A:N/A] ?Weeks of Treatment: [3:Open] [N/A:N/A] ?Wound Status: [3:No] [N/A:N/A] ?Wound Recurrence: [3:1.5x1.4x0.1] [N/A:N/A] ?Measurements L x W x D (cm) [3:1.649] [N/A:N/A] ?A (cm?) : ?rea [3:0.165] [N/A:N/A] ?Volume (cm?) : [3:17.70%] [N/A:N/A] ?% Reduction in A [3:rea: 58.90%] [N/A:N/A] ?% Reduction in Volume: [3:Grade 2] [  N/A:N/A] ?Classification: [3:Medium] [N/A:N/A] ?Exudate A mount: [3:Serosanguineous] [N/A:N/A] ?Exudate Type: [3:red, brown] [N/A:N/A] ?Exudate Color: [3:Flat and Intact] [N/A:N/A] ?Wound Margin: [3:Medium (34-66%)] [N/A:N/A] ?Granulation A mount: [3:Pink, Friable] [N/A:N/A] ?Granulation Quality: [3:Medium (34-66%)] [N/A:N/A] ?Necrotic A mount: ?[3:Fat Layer (Subcutaneous Tissue): Yes N/A] ?Exposed Structures: ?[3:Fascia: No Tendon: No Muscle: No Joint: No Bone: No Small (1-33%)] [N/A:N/A] ?Epithelialization: [3:Debridement - Excisional] [N/A:N/A] ?Debridement: ?Pre-procedure Verification/Time Out 08:40 [N/A:N/A] ?Taken: [3:Lidocaine 4% T opical Solution] [N/A:N/A] ?Pain Control: [3:Subcutaneous, Slough] [N/A:N/A] ?Tissue Debrided: [3:Skin/Subcutaneous Tissue] [N/A:N/A] ?Level: [3:2.1] [N/A:N/A] ?Debridement A (sq cm): [3:rea Curette] [N/A:N/A] ?Instrument: [3:Minimum] [N/A:N/A] ?Bleeding: [3:Silver Nitrate] [N/A:N/A] ?Hemostasis A chieved: [3:0] [N/A:N/A] ?Procedural Pain: [3:0] [N/A:N/A] ?Post Procedural Pain: [3:Procedure was tolerated well] [N/A:N/A] ?Debridement Treatment Response: [3:1.5x1.4x0.1] [N/A:N/A] ?Post Debridement Measurements L x ?W x D (cm)  [3:0.165] [N/A:N/A] ?Post Debridement Volume: (cm?) [3:Debridement] [N/A:N/A] ?Treatment Notes ?Electronic Signature(s) ?Signed: 03/30/2021 8:52:08 AM By: Fredirick Maudlin MD FACS ?Signed: 03/30/2021 5:51:22 PM By: Baruch Gouty RN, BSN ?Entered By: Fredirick Maudlin on 03/30/2021 08:52:08 ?-------------------------------------------------------------------------------- ?Multi-Disciplinary Care Plan Details ?Patient Name: ?Date of Service: ?[name] [name] HNA THA N L. 03/30/2021 8:15 A M ?Medical Record Number: [id number] ?Patient Account Number: [id number] ?Date of Birth/Sex: ?Treating RN: ?[birth year] (66 y.o. [name] [name] ?Primary Care Brendon Christoffel: [name], Alyssa ?Other Clinician: ?Referring Jomar Denz: ?Treating Salley Boxley/Extender: Fredirick Maudlin ?[name], Alyssa ?Weeks in Treatment: 38 ?Multidisciplinary Care Plan reviewed with physician ?Active Inactive ?Necrotic Tissue ?Nursing Diagnoses: ?Impaired tissue integrity related to necrotic/devitalized tissue ?Knowledge deficit related to management of necrotic/devitalized tissue ?Goals: ?Necrotic/devitalized tissue will be minimized in the wound bed ?Date Initiated: 09/22/2020 ?Target Resolution Date: 04/27/2021 ?Goal Status: Active ?Patient/caregiver will verbalize understanding of reason and process for debridement of necrotic tissue ?Date Initiated: 09/22/2020 ?Date Inactivated: 10/06/2020 ?Target Resolution Date: 10/28/2020 ?Goal Status: Met ?Interventions: ?Provide education on necrotic tissue and debridement process ?Treatment Activities: ?Enzymatic debridement : 09/22/2020 ?Excisional debridement : 09/22/2020 ?Notes: ?Wound/Skin Impairment ?Nursing Diagnoses: ?Knowledge deficit related to ulceration/compromised skin integrity ?Goals: ?Patient/caregiver will verbalize understanding of skin care regimen ?Date Initiated: 07/07/2020 ?Target Resolution Date: 04/27/2021 ?Goal Status: Active ?Interventions: ?Assess patient/caregiver ability to obtain necessary supplies ?Assess  patient/caregiver ability to perform ulcer/skin care regimen upon admission and as needed ?Provide education on ulcer and skin care ?Treatment Activities: ?Skin care regimen initiated : 07/07/2020 ?Topical woun

## 2021-03-30 NOTE — Progress Notes (Signed)
CHRISTOPHE, RISING ([id number]) ?Visit Report for 03/30/2021 ?Chief Complaint Document Details ?Patient Name: Date of Service: ?[name] HNA THA N L. 03/30/2021 8:15 A M ?Medical Record Number: [id number] ?Patient Account Number: [id number] ?Date of Birth/Sex: Treating RN: ?March 26, 1955 (66 y.o. [name] ?Primary Care Provider: [name], Alyssa Other Clinician: ?Referring Provider: ?Treating Provider/Extender: Fredirick Maudlin ?[name], Alyssa ?Weeks in Treatment: 38 ?Information Obtained from: Patient ?Chief Complaint ?07/07/2020; patient is in clinic for today for review of pressure ulcers on his coccyx and left heel and diabetic foot ulcers bilaterally. ?Electronic Signature(s) ?Signed: 03/30/2021 8:52:20 AM By: Fredirick Maudlin MD FACS ?Entered By: Fredirick Maudlin on 03/30/2021 08:52:20 ?-------------------------------------------------------------------------------- ?Debridement Details ?Patient Name: Date of Service: ?[name] HNA THA N L. 03/30/2021 8:15 A M ?Medical Record Number: [id number] ?Patient Account Number: [id number] ?Date of Birth/Sex: Treating RN: ?[birth year] (66 y.o. [name] ?Primary Care Provider: [name], Alyssa Other Clinician: ?Referring Provider: ?Treating Provider/Extender: Fredirick Maudlin ?[name], Alyssa ?Weeks in Treatment: 38 ?Debridement Performed for Assessment: Wound #3 Right,Lateral Foot ?Performed By: Physician Fredirick Maudlin, MD ?Debridement Type: Debridement ?Severity of Tissue Pre Debridement: Fat layer exposed ?Level of Consciousness (Pre-procedure): Awake and Alert ?Pre-procedure Verification/Time Out Yes - 08:40 ?Taken: ?Start Time: 08:41 ?Pain Control: Lidocaine 4% T opical Solution ?T Area Debrided (L x W): ?otal 1.5 (cm) x 1.4 (cm) = 2.1 (cm?) ?Tissue and other material debrided: Viable, Non-Viable, Slough, Subcutaneous, Slough ?Level: Skin/Subcutaneous Tissue ?Debridement Description: Excisional ?Instrument: Curette ?Bleeding: Minimum ?Hemostasis  Achieved: Silver Nitrate ?Procedural Pain: 0 ?Post Procedural Pain: 0 ?Response to Treatment: Procedure was tolerated well ?Level of Consciousness (Post- Awake and Alert ?procedure): ?Post Debridement Measurements of Total Wound ?Length: (cm) 1.5 ?Width: (cm) 1.4 ?Depth: (cm) 0.1 ?Volume: (cm?) 0.165 ?Character of Wound/Ulcer Post Debridement: Improved ?Severity of Tissue Post Debridement: Fat layer exposed ?Post Procedure Diagnosis ?Same as Pre-procedure ?Electronic Signature(s) ?Signed: 03/30/2021 5:31:11 PM By: Fredirick Maudlin MD FACS ?Signed: 03/30/2021 5:51:22 PM By: Baruch Gouty RN, BSN ?Entered By: Baruch Gouty on 03/30/2021 08:44:14 ?-------------------------------------------------------------------------------- ?HPI Details ?Patient Name: Date of Service: ?[name] HNA THA N L. 03/30/2021 8:15 A M ?Medical Record Number: [id number] ?Patient Account Number: [id number] ?Date of Birth/Sex: Treating RN: ?[birth year] (66 y.o. [name] ?Primary Care Provider: [name], Alyssa Other Clinician: ?Referring Provider: ?Treating Provider/Extender: Fredirick Maudlin ?[name], Alyssa ?Weeks in Treatment: 38 ?History of Present Illness ?HPI Description: ADMISSION ?07/07/2020 ?This is a 66 year old man who is a type II diabetic. I have reviewed this in epic. It appeared that his diabetes was diagnosed in 2009 at which time he was on ?metformin. He was followed for a period of time by an endocrinologist with [facility] to always labeled him as a type II diabetic. Recently in epic there is been a ?switch to type 1 diabetes. If there were defining antibody test done I do not see them and I think the totality of the evidence would suggest that he is a type II ?diabetic. ?His recent problem started in May when he fell down 17 stairs. He suffered a scapular fracture he was hospitalized at [facility] for 9 days and then discharged to ?[place]. His mother who is present said that most of these wounds seem to develop  surrounding the discharge from hospital or the stay at [facility]. He has an area on his lower sacrum which appears currently to be a stage II but per their description this is actually improved quite a bit. He has an ?area on the left lateral foot  at roughly the base of the fifth metatarsal, the left Achilles heel and the right fifth metatarsal head laterally they have been using ?topical antibiotics. Seen by primary care yesterday and placed on Keflex ?The patient had arterial studies on 05/05/2020. I wondered whether this had something to do with wounds on his feet at that time but I have not been able to ?determine this. At that time his ABI on the right was 1.09 with a TBI of 0.65 on the left 0.88 with a TBI of 0.44. Waveforms were triphasic on the right ?monophasic on the left. He likely has significant PAD on the left ?Past medical history includes type 2 diabetes poorly controlled last hemoglobin A1c at 12.7 in May, hypertension, hyperlipidemia, atrial fib on Eliquis, coronary ?artery disease status post CABG, left scapular fracture in May of this year, AAA repair. Bilateral leg weakness ?ABIs were not done in our clinic as they were just done in May of this year at [facility] ?7/14; patient arrives back in clinic he has areas on the left Achilles heel, base of the left fifth metatarsal, right lateral fifth metatarsal. The area he had over ?the coccyx appears to be healed but this area remains vulnerable. We have been using Iodoflex. ?I rereviewed his ABIs which were done in May. I do not know exactly what prompted these test however they do show a TBI of 0.44 on the left and monophasic ?waveforms. ?7/21; the patient's wounds are about the same. Certainly no healing and the nurses noted some green drainage on the right fifth metatarsal head, left Achilles a ?little larger. We have been using silver alginate really without any improvement. ?We did get an appointment with vascular surgery however its  that it did the end of August and they seem to want to repeat the noninvasive test that they ?have already done in May. I am not sure what the issue here is. We advised the patient's sister to call and point to the idea that they have already had ?noninvasive studies. The real question I have is does this patient need an angiogram. He is a diabetic and it is possible that the ABIs are falsely elevated ?because of medial calcification. At least on the left his TBI was abnormal ?08/08/2020 upon evaluation today patient appears to be doing about the same in regard to his wounds. He still waiting the appointment with vascular. This is ?something that hopefully should be undertaken shortly. The 29th of this month is when he is actually scheduled although they have put in a call to move this ?up if at all possible. Fortunately there does not appear to be any signs of active infection at this time. No fevers, chills, nausea, vomiting, or diarrhea. ?8/18; appointment with Dr. Trula Slade on 8/29. The patient has wounds on the left Achilles heel, left lateral foot and the right lateral fifth metatarsal head. We ?have been using Santyl under Hydrofera Blue. The surface of especially the left heel looks better to me than what I remember. ?9/1; since the patient was last here he underwent angiography by Dr. Trula Slade he also underwent noninvasive studies first showing a ABI of of 0.64 with ?monophasic waveforms. His TBI was 0.35 with a left great toe pressure of 46 this is about the same pattern as we saw previously. The left was a lot worse ?than the right. He underwent an angiogram on 08/29/2020. He had multiple 80% peroneal artery stenosis which was his dominant vessel which was treated with ?angioplasty  with no residual stenosis. 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 The anterior tibial artery was also occluded. An attempt was made at ?recanalization of the anterior tibial artery however this was unsuccessful. Noted that  the peroneal artery reconstitutes the posterior tibial artery and the anterior ?tibial artery creating the plantar arch. ?The patient has areas on the Achilles left heel left lateral foot and right lateral fifth met

## 2021-04-03 ENCOUNTER — Telehealth: Payer: Self-pay

## 2021-04-03 ENCOUNTER — Encounter: Payer: Self-pay | Admitting: Gastroenterology

## 2021-04-03 ENCOUNTER — Ambulatory Visit: Payer: HMO | Admitting: Gastroenterology

## 2021-04-03 VITALS — BP 118/56 | HR 60 | Ht 74.0 in | Wt 171.2 lb

## 2021-04-03 DIAGNOSIS — K529 Noninfective gastroenteritis and colitis, unspecified: Secondary | ICD-10-CM | POA: Diagnosis not present

## 2021-04-03 DIAGNOSIS — D509 Iron deficiency anemia, unspecified: Secondary | ICD-10-CM

## 2021-04-03 DIAGNOSIS — Z7901 Long term (current) use of anticoagulants: Secondary | ICD-10-CM

## 2021-04-03 DIAGNOSIS — Z86010 Personal history of colonic polyps: Secondary | ICD-10-CM | POA: Diagnosis not present

## 2021-04-03 MED ORDER — NA SULFATE-K SULFATE-MG SULF 17.5-3.13-1.6 GM/177ML PO SOLN: 1.0000 | Freq: Once | ORAL | 0 refills | Status: AC

## 2021-04-03 NOTE — Patient Instructions (Addendum)
Your provider has requested that you go to the basement level for lab work. Press "B" on the elevator. The lab is located at the first door on the left as you exit the elevator. ? ?You have been scheduled for an endoscopy and colonoscopy. Please follow the written instructions given to you at your visit today. ?Please pick up your prep supplies at the pharmacy within the next 1-3 days. ?If you use inhalers (even only as needed), please bring them with you on the day of your procedure. ? ?The [facility] providers would like to encourage you to use [facility] to communicate with providers for non-urgent requests or questions.  Due to long hold times on the telephone, sending your provider a message by [facility] may be a faster and more efficient way to get a response.  Please allow 48 business hours for a response.  Please remember that this is for non-urgent requests.  ? ?Due to recent changes in healthcare laws, you may see the results of your imaging and laboratory studies on MyChart before your provider has had a chance to review them.  We understand that in some cases there may be results that are confusing or concerning to you. Not all laboratory results come back in the same time frame and the provider may be waiting for multiple results in order to interpret others.  Please give [place] 48 hours in order for your provider to thoroughly review all the results before contacting the office for clarification of your results.  ? ?Thank you for choosing me and [facility]. ? ?Malcolm T. Dagoberto Ligas., MD., [facility] ? ?

## 2021-04-03 NOTE — Telephone Encounter (Signed)
Patient has been schd for 04/05/2021 ?

## 2021-04-03 NOTE — Telephone Encounter (Signed)
[name] [name] ?1955/01/20 ?[id number] ? ? ?Dear Dr. Loanne Drilling,  ? ? ?Dr. Fuller Plan has scheduled the above individual for a(n) EGD/Colon at 2:00pm on 05/11/21.  Our records show that this patient is on insulin therapy via an insulin pump. ? ?Our colonoscopy prep protocol requires that: ? ?? the patient must be on a clear liquid diet the entire day prior to the procedure date as well as the morning of the procedure ?? the patient must be NPO for 3 to 4 hours prior to the procedure  ?? the patient must consume a PEG 3350 solution to prepare for the procedure. ? ?Please advise [place] of any adjustments that need to be made to the patient?s insulin pump therapy prior to the above procedure date.   ? ?Please route your response to [employer], CMA. ? ?Thank you for your help with this matter. ? ?

## 2021-04-03 NOTE — Telephone Encounter (Signed)
Clinical pharmacist to review Eliquis 

## 2021-04-03 NOTE — Progress Notes (Signed)
? ? ?  History of Present Illness: This is a 66 year old male with iron deficiency anemia, personal history of adenomatous colon polyps and alternating bowel habits with occasional incontinence.  He is accompanied by his mother.  He was hospitalized in February for acute diastolic heart failure with a preserved EF.  Iron deficiency anemia was noted and he received intravenous iron.  He is maintained on Eliquis for paroxysmal atrial fibrillation.  He relates a bowel pattern of constipation for 2 to 3 days and then looser stools with occasional incontinence to follow.  He has poorly controlled diabetes mellitus.  He has recently started a low sodium diabetic diet and states his bowel habits have improved. ? ?Colonoscopy 03/2019 ?- Four 12 to 17 mm polyps in the descending colon and in the transverse colon, removed with a hot snare. Resected and retrieved.  ?- Four 7 to 8 mm polyps in the sigmoid colon, in the transverse colon and in the ascending ?colon, removed with a cold snare. Resected and retrieved. ?- One 18 mm polyp in the sigmoid colon, removed with a hot snare. Resected and retrieved. ?- Internal hemorrhoids. ?- The examination was otherwise normal on direct and retroflexion views. ?Path: tubular adenomas ? ?Current Medications, Allergies, Past Medical History, Past Surgical History, Family History and Social History were reviewed in [employer] record. ? ? ? ?Physical Exam: ?General: Well developed, well nourished, uses a walker, no acute distress ?Head: Normocephalic and atraumatic ?Eyes: Sclerae anicteric, EOMI ?Ears: Normal auditory acuity ?Mouth: Not examined, mask on during Covid-19 pandemic ?Lungs: Clear throughout to auscultation ?Heart: Regular rate and rhythm; no murmurs, rubs or bruits ?Abdomen: Soft, non tender and non distended. No masses, hepatosplenomegaly or hernias noted. Normal Bowel sounds ?Rectal: Deferred to colonoscopy  ?Musculoskeletal: Symmetrical with no gross  deformities  ?Pulses:  Normal pulses noted ?Extremities: No clubbing, cyanosis, edema or deformities noted ?Neurological: Alert oriented x 4, slow ambulation ?Psychological:  Alert and cooperative. Normal mood and affect ? ? ?Assessment and Recommendations: ? ?Iron deficiency anemia, alternating constipation and diarrhea, personal history of multiple adenomatous colon polyps.  Rule out AVMs, gastritis, neoplasms, celiac disease and other sources of IDA. CBC, Fe, TIBC, ferritin, tTG, IgA. Schedule colonoscopy and EGD. The risks (including bleeding, perforation, infection, missed lesions, medication reactions and possible hospitalization or surgery if complications occur), benefits, and alternatives to endoscopy with possible biopsy and possible dilation were discussed with the patient and they consent to proceed.  The risks (including bleeding, perforation, infection, missed lesions, medication reactions and possible hospitalization or surgery if complications occur), benefits, and alternatives to colonoscopy with possible biopsy and possible polypectomy were discussed with the patient and they consent to proceed.   ?PAF. Hold Eliquis 2 days before procedure - will instruct when and how to resume after procedure. Low but real risk of cardiovascular event such as heart attack, stroke, embolism, thrombosis or ischemia/infarct of other organs off Eliquis explained and need to seek urgent help if this occurs. The patient consents to proceed. Will communicate by phone or EMR with patient's prescribing provider to confirm that holding Eliquis is reasonable in this case.    ?CAD, S/P CABG ?CHF ?PVD ?DM, poorly controlled ?

## 2021-04-03 NOTE — Telephone Encounter (Signed)
[facility] Pre-operative Risk Assessment  ?   ?Request for surgical clearance:     Endoscopy Procedure ? ?What type of surgery is being performed?     EGD/Colon ? ?When is this surgery scheduled?     05/11/21 ? ?What type of clearance is required ?   Pharmacy ? ?Are there any medications that need to be held prior to surgery and how long? Eliquis x 2 days ? ?Practice name and name of physician performing surgery?      [facility] ? ?What is your office phone and fax number?      Phone- [phone]  Fax- [phone] ? ?Anesthesia type (None, local, MAC, general) ?       MAC ? ?

## 2021-04-03 NOTE — Telephone Encounter (Signed)
Left message for patient to return my call.

## 2021-04-03 NOTE — Telephone Encounter (Signed)
Patient with diagnosis of atrial fibrillation on Eliquis for anticoagulation.   ? ?Procedure: EGD/colon ?Date of procedure: 05/11/21 ? ? ?CHA2DS2-VASc Score = 5  ? This indicates a 7.2% annual risk of stroke. ?The patient's score is based upon: ?CHF History: 1 ?HTN History: 1 ?Diabetes History: 1 ?Stroke History: 0 ?Vascular Disease History: 1 ?Age Score: 1 ?Gender Score: 0 ?  ? ?CrCl 105 ?Platelet count 155 ? ?Per office protocol, patient can hold Elquis for 2 days prior to procedure.   ?Patient will not need bridging with Lovenox (enoxaparin) around procedure. ? ? ? ?

## 2021-04-04 DIAGNOSIS — E1151 Type 2 diabetes mellitus with diabetic peripheral angiopathy without gangrene: Secondary | ICD-10-CM | POA: Diagnosis not present

## 2021-04-04 DIAGNOSIS — I480 Paroxysmal atrial fibrillation: Secondary | ICD-10-CM | POA: Diagnosis not present

## 2021-04-04 DIAGNOSIS — I10 Essential (primary) hypertension: Secondary | ICD-10-CM | POA: Diagnosis not present

## 2021-04-04 DIAGNOSIS — E559 Vitamin D deficiency, unspecified: Secondary | ICD-10-CM | POA: Diagnosis not present

## 2021-04-04 DIAGNOSIS — L409 Psoriasis, unspecified: Secondary | ICD-10-CM | POA: Diagnosis not present

## 2021-04-04 DIAGNOSIS — E1142 Type 2 diabetes mellitus with diabetic polyneuropathy: Secondary | ICD-10-CM | POA: Diagnosis not present

## 2021-04-04 DIAGNOSIS — D649 Anemia, unspecified: Secondary | ICD-10-CM | POA: Diagnosis not present

## 2021-04-04 DIAGNOSIS — M720 Palmar fascial fibromatosis [Dupuytren]: Secondary | ICD-10-CM | POA: Diagnosis not present

## 2021-04-04 DIAGNOSIS — E11621 Type 2 diabetes mellitus with foot ulcer: Secondary | ICD-10-CM | POA: Diagnosis not present

## 2021-04-04 DIAGNOSIS — Z7901 Long term (current) use of anticoagulants: Secondary | ICD-10-CM | POA: Diagnosis not present

## 2021-04-04 DIAGNOSIS — F3341 Major depressive disorder, recurrent, in partial remission: Secondary | ICD-10-CM | POA: Diagnosis not present

## 2021-04-04 DIAGNOSIS — N138 Other obstructive and reflux uropathy: Secondary | ICD-10-CM | POA: Diagnosis not present

## 2021-04-04 DIAGNOSIS — K5904 Chronic idiopathic constipation: Secondary | ICD-10-CM | POA: Diagnosis not present

## 2021-04-04 DIAGNOSIS — N401 Enlarged prostate with lower urinary tract symptoms: Secondary | ICD-10-CM | POA: Diagnosis not present

## 2021-04-04 DIAGNOSIS — M1990 Unspecified osteoarthritis, unspecified site: Secondary | ICD-10-CM | POA: Diagnosis not present

## 2021-04-04 DIAGNOSIS — G8929 Other chronic pain: Secondary | ICD-10-CM | POA: Diagnosis not present

## 2021-04-04 DIAGNOSIS — Z7982 Long term (current) use of aspirin: Secondary | ICD-10-CM | POA: Diagnosis not present

## 2021-04-04 DIAGNOSIS — F17210 Nicotine dependence, cigarettes, uncomplicated: Secondary | ICD-10-CM | POA: Diagnosis not present

## 2021-04-04 DIAGNOSIS — M47812 Spondylosis without myelopathy or radiculopathy, cervical region: Secondary | ICD-10-CM | POA: Diagnosis not present

## 2021-04-04 DIAGNOSIS — L97512 Non-pressure chronic ulcer of other part of right foot with fat layer exposed: Secondary | ICD-10-CM | POA: Diagnosis not present

## 2021-04-04 DIAGNOSIS — I25812 Atherosclerosis of bypass graft of coronary artery of transplanted heart without angina pectoris: Secondary | ICD-10-CM | POA: Diagnosis not present

## 2021-04-04 DIAGNOSIS — E113299 Type 2 diabetes mellitus with mild nonproliferative diabetic retinopathy without macular edema, unspecified eye: Secondary | ICD-10-CM | POA: Diagnosis not present

## 2021-04-04 DIAGNOSIS — E785 Hyperlipidemia, unspecified: Secondary | ICD-10-CM | POA: Diagnosis not present

## 2021-04-04 DIAGNOSIS — F419 Anxiety disorder, unspecified: Secondary | ICD-10-CM | POA: Diagnosis not present

## 2021-04-04 NOTE — Telephone Encounter (Signed)
Informed patient's mother that patient can hold his Eliquis 2 days prior to his procedure per Cardiology. Patient's mother verbalized understanding. ?

## 2021-04-05 ENCOUNTER — Other Ambulatory Visit: Payer: Self-pay | Admitting: Internal Medicine

## 2021-04-05 ENCOUNTER — Telehealth: Payer: Self-pay

## 2021-04-05 ENCOUNTER — Ambulatory Visit: Payer: HMO | Admitting: Endocrinology

## 2021-04-05 DIAGNOSIS — F419 Anxiety disorder, unspecified: Secondary | ICD-10-CM | POA: Diagnosis not present

## 2021-04-05 DIAGNOSIS — E1151 Type 2 diabetes mellitus with diabetic peripheral angiopathy without gangrene: Secondary | ICD-10-CM | POA: Diagnosis not present

## 2021-04-05 DIAGNOSIS — N138 Other obstructive and reflux uropathy: Secondary | ICD-10-CM | POA: Diagnosis not present

## 2021-04-05 DIAGNOSIS — E559 Vitamin D deficiency, unspecified: Secondary | ICD-10-CM | POA: Diagnosis not present

## 2021-04-05 DIAGNOSIS — Z7982 Long term (current) use of aspirin: Secondary | ICD-10-CM | POA: Diagnosis not present

## 2021-04-05 DIAGNOSIS — I480 Paroxysmal atrial fibrillation: Secondary | ICD-10-CM

## 2021-04-05 DIAGNOSIS — G8929 Other chronic pain: Secondary | ICD-10-CM | POA: Diagnosis not present

## 2021-04-05 DIAGNOSIS — K5904 Chronic idiopathic constipation: Secondary | ICD-10-CM | POA: Diagnosis not present

## 2021-04-05 DIAGNOSIS — F3341 Major depressive disorder, recurrent, in partial remission: Secondary | ICD-10-CM | POA: Diagnosis not present

## 2021-04-05 DIAGNOSIS — D649 Anemia, unspecified: Secondary | ICD-10-CM | POA: Diagnosis not present

## 2021-04-05 DIAGNOSIS — E11621 Type 2 diabetes mellitus with foot ulcer: Secondary | ICD-10-CM | POA: Diagnosis not present

## 2021-04-05 DIAGNOSIS — E1142 Type 2 diabetes mellitus with diabetic polyneuropathy: Secondary | ICD-10-CM | POA: Diagnosis not present

## 2021-04-05 DIAGNOSIS — L409 Psoriasis, unspecified: Secondary | ICD-10-CM | POA: Diagnosis not present

## 2021-04-05 DIAGNOSIS — Z7901 Long term (current) use of anticoagulants: Secondary | ICD-10-CM | POA: Diagnosis not present

## 2021-04-05 DIAGNOSIS — F17210 Nicotine dependence, cigarettes, uncomplicated: Secondary | ICD-10-CM | POA: Diagnosis not present

## 2021-04-05 DIAGNOSIS — E785 Hyperlipidemia, unspecified: Secondary | ICD-10-CM | POA: Diagnosis not present

## 2021-04-05 DIAGNOSIS — E113299 Type 2 diabetes mellitus with mild nonproliferative diabetic retinopathy without macular edema, unspecified eye: Secondary | ICD-10-CM | POA: Diagnosis not present

## 2021-04-05 DIAGNOSIS — M47812 Spondylosis without myelopathy or radiculopathy, cervical region: Secondary | ICD-10-CM | POA: Diagnosis not present

## 2021-04-05 DIAGNOSIS — L97512 Non-pressure chronic ulcer of other part of right foot with fat layer exposed: Secondary | ICD-10-CM | POA: Diagnosis not present

## 2021-04-05 DIAGNOSIS — I25812 Atherosclerosis of bypass graft of coronary artery of transplanted heart without angina pectoris: Secondary | ICD-10-CM | POA: Diagnosis not present

## 2021-04-05 DIAGNOSIS — M1990 Unspecified osteoarthritis, unspecified site: Secondary | ICD-10-CM | POA: Diagnosis not present

## 2021-04-05 DIAGNOSIS — I10 Essential (primary) hypertension: Secondary | ICD-10-CM | POA: Diagnosis not present

## 2021-04-05 DIAGNOSIS — M720 Palmar fascial fibromatosis [Dupuytren]: Secondary | ICD-10-CM | POA: Diagnosis not present

## 2021-04-05 DIAGNOSIS — N401 Enlarged prostate with lower urinary tract symptoms: Secondary | ICD-10-CM | POA: Diagnosis not present

## 2021-04-05 NOTE — Telephone Encounter (Signed)
..   Encourage patient to contact the pharmacy for refills or they can request refills through [facility]:  02/24/21  NEXT APPOINTMENT DATE: na  MEDICATION: losartan, eliquis, furosemide  Is the patient out of medication?   PHARMACY: [facility] @ [phone]  Let patient know to contact pharmacy at the end of the day to make sure medication is ready.  Please notify patient to allow 48-72 hours to process  CLINICAL FILLS OUT ALL BELOW:   LAST REFILL:  QTY:  REFILL DATE:    OTHER COMMENTS:    Okay for refill?  Please advise

## 2021-04-06 ENCOUNTER — Encounter (HOSPITAL_BASED_OUTPATIENT_CLINIC_OR_DEPARTMENT_OTHER): Payer: HMO | Admitting: General Surgery

## 2021-04-06 ENCOUNTER — Ambulatory Visit: Payer: HMO | Admitting: Endocrinology

## 2021-04-06 ENCOUNTER — Other Ambulatory Visit: Payer: Self-pay

## 2021-04-06 DIAGNOSIS — I480 Paroxysmal atrial fibrillation: Secondary | ICD-10-CM

## 2021-04-06 MED ORDER — LOSARTAN POTASSIUM 25 MG PO TABS: 25.0000 mg | ORAL_TABLET | Freq: Every day | ORAL | 1 refills | Status: DC

## 2021-04-06 MED ORDER — APIXABAN 5 MG PO TABS: 5.0000 mg | ORAL_TABLET | Freq: Two times a day (BID) | ORAL | 2 refills | Status: DC

## 2021-04-06 MED ORDER — FUROSEMIDE 40 MG PO TABS: 40.0000 mg | ORAL_TABLET | Freq: Every day | ORAL | 1 refills | Status: DC

## 2021-04-06 NOTE — Telephone Encounter (Signed)
Patient cancelled appt with Dr. Loanne Drilling today due to lack of transportation. Would you still give recommendations even with patient cancelling appt? ?

## 2021-04-06 NOTE — Telephone Encounter (Signed)
Rx sent in

## 2021-04-10 DIAGNOSIS — F330 Major depressive disorder, recurrent, mild: Secondary | ICD-10-CM | POA: Diagnosis not present

## 2021-04-10 DIAGNOSIS — I5030 Unspecified diastolic (congestive) heart failure: Secondary | ICD-10-CM | POA: Diagnosis not present

## 2021-04-10 DIAGNOSIS — I110 Hypertensive heart disease with heart failure: Secondary | ICD-10-CM | POA: Diagnosis not present

## 2021-04-10 NOTE — Telephone Encounter (Signed)
Left message for patient to return my call.

## 2021-04-10 NOTE — Progress Notes (Signed)
?[facility] Note ? ?04/14/2021 ? ?  ? ?CHIEF COMPLAINT ?Patient presents for Retina Follow Up ? ?HISTORY OF PRESENT ILLNESS: ?[name] [name] is a 66 y.o. male who presents to the clinic today for:  ? ?HPI   ? ? Retina Follow Up   ?Patient presents with  Diabetic Retinopathy (Last IVA OU 03/17/21).  In both eyes.  This started 4 weeks ago.  I, the attending physician,  performed the HPI with the patient and updated documentation appropriately. ? ?  ?  ? ? Comments   ?Patient feels that the vision in the right eye has improved. The left eye he feels that he can see colors, shapes, and people. He doesn't feel that he would be able to read with the left eye. Patients blood sugar at last check yesterday was 166. He is unsure of his A1C and blood work is pending. He refuses to get the blood work because he doesn't like getting stuck with needles. ? ?  ?  ?Last edited by [name], MD on 04/14/2021 12:48 PM.  ?  ? ?Referring physician: ?[name], [name], PA-C ?[place],  [place] [address] ? ?HISTORICAL INFORMATION:  ? ?Selected notes from the [place] ?Referred by Dr. [name] for PDR OU  ? ?CURRENT MEDICATIONS: ?No current outpatient medications on file. (Ophthalmic Drugs)  ? ?No current facility-administered medications for this visit. (Ophthalmic Drugs)  ? ?Current Outpatient Medications (Other)  ?Medication Sig  ? apixaban (ELIQUIS) 5 MG TABS tablet Take 1 tablet (5 mg total) by mouth 2 (two) times daily.  ? aspirin EC 81 MG EC tablet Take 1 tablet (81 mg total) by mouth daily.  ? atorvastatin (LIPITOR) 40 MG tablet Take 1 tablet (40 mg total) by mouth daily.  ? B Complex Vitamins (B COMPLEX-B12 PO) Take 1 tablet by mouth daily.   ? BD PEN NEEDLE NANO 2ND GEN 32G X 4 MM MISC USE FOR INSULIN  ? Blood Glucose Monitoring Suppl ([place]) w/Device KIT   ? Cholecalciferol (VITAMIN D) 125 MCG (5000 UT) CAPS Take 5,000 Units by mouth  daily.   ? Continuous Blood Gluc Sensor (DEXCOM G6 SENSOR) MISC 1 Device by Does not apply route See admin instructions. Change every 10 days  ? Continuous Blood Gluc Transmit (DEXCOM G6 TRANSMITTER) MISC Use as instructed to check blood sugar  ? furosemide (LASIX) 40 MG tablet Take 1 tablet (40 mg total) by mouth daily.  ? Incontinence Supply Disposable (DISPOSABLE BRIEF LARGE) MISC 1 each by Does not apply route as needed.  ? Insulin Disposable Pump (OMNIPOD 5 G6 INTRO, GEN 5,) KIT 1 Device by Does not apply route every 3 (three) days.  ? Lancets (ONETOUCH DELICA PLUS [id number]) MISC Apply 1 each topically 4 (four) times daily.  ? loperamide (IMODIUM A-D) 2 MG tablet Take 1 tablet (2 mg total) by mouth 4 (four) times daily as needed for diarrhea or loose stools.  ? losartan (COZAAR) 25 MG tablet Take 1 tablet (25 mg total) by mouth daily.  ? mirtazapine (REMERON) 15 MG tablet Take 1 tablet (15 mg total) by mouth at bedtime.  ? ONETOUCH VERIO test strip 1 each 4 (four) times daily.  ? PARoxetine (PAXIL) 20 MG tablet Take 1 tablet (20 mg total) by mouth daily.  ? spironolactone (ALDACTONE) 25 MG tablet Take 1 tablet (25 mg total) by mouth daily.  ? ?No current facility-administered medications for this  visit. (Other)  ? ?REVIEW OF SYSTEMS: ?ROS   ?Positive for: Neurological, Skin, Musculoskeletal, Endocrine, Cardiovascular, Eyes ?Negative for: Constitutional, Gastrointestinal, Genitourinary, HENT, Respiratory, Psychiatric, Allergic/Imm, Heme/Lymph ?Last edited by Annie Paras, COT on 04/14/2021  8:39 AM.  ?  ? ?ALLERGIES ?No Known Allergies ? ?PAST MEDICAL HISTORY ?Past Medical History:  ?Diagnosis Date  ? Anxiety   ? Arthritis   ? Cataract   ? CHF (congestive heart failure) ([place])   ? Constipation   ? pt states goes EOD- hard stools   ? Coronary artery disease   ? Depression   ? Diabetes mellitus without complication ([place])   ? Diabetic retinopathy ([place])   ? DKA (diabetic ketoacidoses)   ? Hyperlipidemia   ?  Hypertension   ? off meds  ? Hypertensive retinopathy   ? Psoriasis   ? Tubular adenoma of colon 03/2019  ? ?Past Surgical History:  ?Procedure Laterality Date  ? ABDOMINAL AORTOGRAM W/LOWER EXTREMITY N/A 08/30/2020  ? Procedure: ABDOMINAL AORTOGRAM W/LOWER EXTREMITY;  Surgeon: Serafina Mitchell, MD;  Location: [facility];  Service: Cardiovascular;  Laterality: N/A;  ? ABDOMINAL AORTOGRAM W/LOWER EXTREMITY N/A 11/15/2020  ? Procedure: ABDOMINAL AORTOGRAM W/LOWER EXTREMITY;  Surgeon: Serafina Mitchell, MD;  Location: [facility];  Service: Cardiovascular;  Laterality: N/A;  ? ANKLE SURGERY    ? right  ? CARDIAC CATHETERIZATION  09/25/2001  ? COLONOSCOPY    ? ~10 yr ago- normal  per pt   ? CORONARY ARTERY BYPASS GRAFT  09/30/2001  ? CABG x 4  ? IR THORACENTESIS ASP PLEURAL SPACE W/IMG GUIDE  05/19/2020  ? LAPAROSCOPIC INGUINAL HERNIA REPAIR Bilateral 02/09/2010  ? MASS EXCISION Left 03/12/2013  ? Procedure: LEFT INDEX EXCISION MASS AND DEBRIDMENT DISTAL INTERPHALANGEAL JOINT;  Surgeon: Tennis Must, MD;  Location: [facility];  Service: Orthopedics;  Laterality: Left;  ? PERIPHERAL VASCULAR BALLOON ANGIOPLASTY Left 08/30/2020  ? Procedure: PERIPHERAL VASCULAR BALLOON ANGIOPLASTY;  Surgeon: Serafina Mitchell, MD;  Location: [facility];  Service: Cardiovascular;  Laterality: Left;  Peroneal  ? ?FAMILY HISTORY ?Family History  ?Problem Relation Age of Onset  ? Lymphoma Father   ? Cancer Paternal Grandmother   ?     Colon Cancer  ? Colon cancer Paternal Grandmother   ? Colon polyps Neg Hx   ? Esophageal cancer Neg Hx   ? Rectal cancer Neg Hx   ? Stomach cancer Neg Hx   ? Diabetes Mellitus I Neg Hx   ? Pancreatic cancer Neg Hx   ? ?SOCIAL HISTORY ?Social History  ? ?Tobacco Use  ? Smoking status: Some Days  ?  Types: Cigars  ? Smokeless tobacco: Never  ?Vaping Use  ? Vaping Use: Never used  ?Substance Use Topics  ? Alcohol use: Yes  ?  Comment: daily beer or scotch   ? Drug use: No  ?  ? ?   ?OPHTHALMIC EXAM: ?Base Eye Exam   ? ? Visual Acuity (Snellen - Linear)   ? ?   Right Left  ? Dist [place] 20/60 CF at face  ? Dist cc 20/80   ? Dist ph [place] 20/40   ? ? Correction: Glasses  ? ?  ?  ? ? Tonometry (Tonopen, 8:54 AM)   ? ?   Right Left  ? Pressure 9 9  ? ?  ?  ? ? Pupils   ? ?   Pupils Dark Light Shape React APD  ?  Right PERRL 3 2 Round Brisk None  ? Left PERRL 3 2 Round Brisk None  ? ?  ?  ? ? Visual Fields   ? ?   Left Right  ?   Full  ? Restrictions Partial outer superior temporal, inferior temporal, superior nasal deficiencies   ? ?  ?  ? ? Extraocular Movement   ? ?   Right Left  ?  Full, Ortho Full, Ortho  ? ?  ?  ? ? Neuro/Psych   ? ? Oriented x3: Yes  ? Mood/Affect: Normal  ? ?  ?  ? ? Dilation   ? ? Both eyes: 2.5% Phenylephrine, 1.0% Mydriacyl @ 8:45 AM  ? ?  ?  ? ?  ? ?Slit Lamp and Fundus Exam   ? ? External Exam   ? ?   Right Left  ? External Normal Normal  ? ?  ?  ? ? Slit Lamp Exam   ? ?   Right Left  ? Lids/Lashes Dermatochalasis - upper lid, Meibomian gland dysfunction Dermatochalasis - upper lid, Meibomian gland dysfunction  ? Conjunctiva/Sclera White and quiet White and quiet  ? Cornea arcus, 2-3+ Punctate epithelial erosions arcus, 2+ Punctate epithelial erosions  ? Anterior Chamber Deep and quiet Deep and quiet  ? Iris Round and dilated, No NVI Round and dilated, No NVI  ? Lens 2-3+ Nuclear sclerosis with brunescence, 2+ Cortical cataract 2-3+ Nuclear sclerosis with brunescence, 2-3+ Cortical cataract  ? Anterior Vitreous Vitreous syneresis, blood stained vitreous condensations persistent Vitreous syneresis, diffuse VH - concentrated centrally, blood stained vitreous condensations slightly improved from prior, pre-retinal heme collecting inferiorly  ? ?  ?  ? ? Fundus Exam   ? ?   Right Left  ? Disc Pink and Sharp, NVD - regressed obscured by heme and fibrosis  ? C/D Ratio 0.2 0.2  ? Macula Flat, good foveal reflex, scatterd MA, +cystic changes/edema temporal fovea and macula --  slightly improved hazy view, grossly attached, +DBH, fibrosis  ? Vessels attenuated, mild tortuosity, +NVE just inside ST arcades - improved attenuated, Tortuous, +NVD; NVE along ST arcades  ? Periphery Attached; 360

## 2021-04-11 NOTE — Telephone Encounter (Signed)
Patients mother returned your call to patient, please advise.  ?

## 2021-04-11 NOTE — Telephone Encounter (Signed)
Spoke with patient's mother and informed her that the patient has to be seen by Dr. Loanne Drilling prior to his procedure before he will give recommendations regarding his insulin pump. Patient's mother states he had an appt but had to cancel that day because the patient was vomiting. Patient's mother states she has been trying to get in touch with someone at Dr. Cordelia Pen office to reschedule the appt but has unsuccessful. She will keep trying. Informed patient's mother to try sending a MyChart message because it could be quicker. She verbalized understanding. ?

## 2021-04-14 ENCOUNTER — Encounter (INDEPENDENT_AMBULATORY_CARE_PROVIDER_SITE_OTHER): Payer: Self-pay | Admitting: Ophthalmology

## 2021-04-14 ENCOUNTER — Ambulatory Visit (INDEPENDENT_AMBULATORY_CARE_PROVIDER_SITE_OTHER): Payer: HMO | Admitting: Ophthalmology

## 2021-04-14 DIAGNOSIS — H35033 Hypertensive retinopathy, bilateral: Secondary | ICD-10-CM

## 2021-04-14 DIAGNOSIS — H4312 Vitreous hemorrhage, left eye: Secondary | ICD-10-CM | POA: Diagnosis not present

## 2021-04-14 DIAGNOSIS — E113513 Type 2 diabetes mellitus with proliferative diabetic retinopathy with macular edema, bilateral: Secondary | ICD-10-CM | POA: Diagnosis not present

## 2021-04-14 DIAGNOSIS — I10 Essential (primary) hypertension: Secondary | ICD-10-CM | POA: Diagnosis not present

## 2021-04-14 DIAGNOSIS — H25813 Combined forms of age-related cataract, bilateral: Secondary | ICD-10-CM

## 2021-04-14 MED ORDER — BEVACIZUMAB CHEMO INJECTION 1.25MG/0.05ML SYRINGE FOR KALEIDOSCOPE
1.2500 mg | INTRAVITREAL | Status: AC | PRN
  Administered 2021-04-14: 1.25 mg via INTRAVITREAL

## 2021-04-17 DIAGNOSIS — E113299 Type 2 diabetes mellitus with mild nonproliferative diabetic retinopathy without macular edema, unspecified eye: Secondary | ICD-10-CM | POA: Diagnosis not present

## 2021-04-17 DIAGNOSIS — L409 Psoriasis, unspecified: Secondary | ICD-10-CM | POA: Diagnosis not present

## 2021-04-17 DIAGNOSIS — E559 Vitamin D deficiency, unspecified: Secondary | ICD-10-CM | POA: Diagnosis not present

## 2021-04-17 DIAGNOSIS — F419 Anxiety disorder, unspecified: Secondary | ICD-10-CM | POA: Diagnosis not present

## 2021-04-17 DIAGNOSIS — L97511 Non-pressure chronic ulcer of other part of right foot limited to breakdown of skin: Secondary | ICD-10-CM | POA: Diagnosis not present

## 2021-04-17 DIAGNOSIS — G8929 Other chronic pain: Secondary | ICD-10-CM | POA: Diagnosis not present

## 2021-04-17 DIAGNOSIS — I509 Heart failure, unspecified: Secondary | ICD-10-CM | POA: Diagnosis not present

## 2021-04-17 DIAGNOSIS — E1151 Type 2 diabetes mellitus with diabetic peripheral angiopathy without gangrene: Secondary | ICD-10-CM | POA: Diagnosis not present

## 2021-04-17 DIAGNOSIS — M47812 Spondylosis without myelopathy or radiculopathy, cervical region: Secondary | ICD-10-CM | POA: Diagnosis not present

## 2021-04-17 DIAGNOSIS — F17210 Nicotine dependence, cigarettes, uncomplicated: Secondary | ICD-10-CM | POA: Diagnosis not present

## 2021-04-17 DIAGNOSIS — F3341 Major depressive disorder, recurrent, in partial remission: Secondary | ICD-10-CM | POA: Diagnosis not present

## 2021-04-17 DIAGNOSIS — E785 Hyperlipidemia, unspecified: Secondary | ICD-10-CM | POA: Diagnosis not present

## 2021-04-17 DIAGNOSIS — M720 Palmar fascial fibromatosis [Dupuytren]: Secondary | ICD-10-CM | POA: Diagnosis not present

## 2021-04-17 DIAGNOSIS — K5904 Chronic idiopathic constipation: Secondary | ICD-10-CM | POA: Diagnosis not present

## 2021-04-17 DIAGNOSIS — M1990 Unspecified osteoarthritis, unspecified site: Secondary | ICD-10-CM | POA: Diagnosis not present

## 2021-04-17 DIAGNOSIS — N138 Other obstructive and reflux uropathy: Secondary | ICD-10-CM | POA: Diagnosis not present

## 2021-04-17 DIAGNOSIS — N401 Enlarged prostate with lower urinary tract symptoms: Secondary | ICD-10-CM | POA: Diagnosis not present

## 2021-04-17 DIAGNOSIS — E1142 Type 2 diabetes mellitus with diabetic polyneuropathy: Secondary | ICD-10-CM | POA: Diagnosis not present

## 2021-04-17 DIAGNOSIS — I2510 Atherosclerotic heart disease of native coronary artery without angina pectoris: Secondary | ICD-10-CM | POA: Diagnosis not present

## 2021-04-17 DIAGNOSIS — I480 Paroxysmal atrial fibrillation: Secondary | ICD-10-CM | POA: Diagnosis not present

## 2021-04-17 DIAGNOSIS — I110 Hypertensive heart disease with heart failure: Secondary | ICD-10-CM | POA: Diagnosis not present

## 2021-04-17 DIAGNOSIS — D649 Anemia, unspecified: Secondary | ICD-10-CM | POA: Diagnosis not present

## 2021-04-17 DIAGNOSIS — E11621 Type 2 diabetes mellitus with foot ulcer: Secondary | ICD-10-CM | POA: Diagnosis not present

## 2021-04-17 DIAGNOSIS — Z794 Long term (current) use of insulin: Secondary | ICD-10-CM | POA: Diagnosis not present

## 2021-04-18 ENCOUNTER — Encounter: Payer: HMO | Attending: Endocrinology | Admitting: Nutrition

## 2021-04-18 ENCOUNTER — Other Ambulatory Visit: Payer: Self-pay | Admitting: Endocrinology

## 2021-04-18 DIAGNOSIS — E1042 Type 1 diabetes mellitus with diabetic polyneuropathy: Secondary | ICD-10-CM | POA: Insufficient documentation

## 2021-04-18 DIAGNOSIS — E10628 Type 1 diabetes mellitus with other skin complications: Secondary | ICD-10-CM

## 2021-04-18 DIAGNOSIS — L089 Local infection of the skin and subcutaneous tissue, unspecified: Secondary | ICD-10-CM

## 2021-04-18 DIAGNOSIS — Z713 Dietary counseling and surveillance: Secondary | ICD-10-CM | POA: Diagnosis not present

## 2021-04-18 MED ORDER — INSULIN ASPART 100 UNIT/ML IJ SOLN: INTRAMUSCULAR | 0 refills | Status: DC

## 2021-04-18 NOTE — Progress Notes (Signed)
Patient is here with his mother.  He reports that he can not get his Dexcoms to work.  It turns out that his transmitter has expired and he is in need of a new one.  He was reminded that the transmitter is only good for 3 months.  He was given a new transmitter and  and new sensor.  This was started and linked to his Pod, PDM, and Dexcom app.   ?We reveiwed again the steps for giving a bolus and a correction dose, and what IOB means.  He had no final questions.   ?

## 2021-04-18 NOTE — Patient Instructions (Signed)
Change sensor every 10 days ?Change transmitter every 3 months ?

## 2021-04-19 ENCOUNTER — Other Ambulatory Visit: Payer: Self-pay

## 2021-04-19 DIAGNOSIS — E1042 Type 1 diabetes mellitus with diabetic polyneuropathy: Secondary | ICD-10-CM

## 2021-04-19 MED ORDER — DEXCOM G6 TRANSMITTER MISC: 1 refills | Status: DC

## 2021-04-20 ENCOUNTER — Encounter: Payer: Self-pay | Admitting: Endocrinology

## 2021-04-20 ENCOUNTER — Ambulatory Visit (INDEPENDENT_AMBULATORY_CARE_PROVIDER_SITE_OTHER): Payer: HMO | Admitting: Endocrinology

## 2021-04-20 VITALS — BP 100/64 | HR 71 | Ht 74.0 in | Wt 175.0 lb

## 2021-04-20 DIAGNOSIS — E1142 Type 2 diabetes mellitus with diabetic polyneuropathy: Secondary | ICD-10-CM | POA: Diagnosis not present

## 2021-04-20 DIAGNOSIS — E1042 Type 1 diabetes mellitus with diabetic polyneuropathy: Secondary | ICD-10-CM

## 2021-04-20 DIAGNOSIS — E1165 Type 2 diabetes mellitus with hyperglycemia: Secondary | ICD-10-CM | POA: Diagnosis not present

## 2021-04-20 LAB — POCT GLYCOSYLATED HEMOGLOBIN (HGB A1C): Hemoglobin A1C: 9.2 % — AB (ref 4.0–5.6)

## 2021-04-20 NOTE — Telephone Encounter (Signed)
Patient had office appt today with Dr. Loanne Drilling. Dr. Loanne Drilling, please give recommendations for patients insulin pump prior to procedure. Thanks. ?

## 2021-04-20 NOTE — Patient Instructions (Addendum)
check your blood sugar twice a day.  vary the time of day when you check, between before the 3 meals, and at bedtime.  also check if you have symptoms of your blood sugar being too high or too low.  please keep a record of the readings and bring it to your next appointment here (or you can bring the meter itself).  You can write it on any piece of paper.  please call us sooner if your blood sugar goes below 70, or if most of your readings are over 200.   ?Please take these pump settings: ?basal rate of 0.5 units/hr.  ?bolus of 4-7 units 3 times a day (just before each meal).  ?correction bolus (which some people call "sensitivity," or "insulin sensitivity ratio," or just "isr") of 1 unit for each 100 by which your glucose exceeds 100.   ?You should have an endocrinology follow-up appointment in 3 months.   ?

## 2021-04-20 NOTE — Telephone Encounter (Signed)
Informed patient's mother [facility]) of Dr. Cordelia Pen recommendations regarding insulin pump. Patient's mother verbalized understanding. ?

## 2021-04-20 NOTE — Progress Notes (Signed)
? ?Subjective:  ? ? Patient ID: [name] [name], male    DOB: 1955-07-25, 66 y.o.   MRN: [id number] ? ?HPI ?Pt returns for f/u of diabetes mellitus: ?DM type: Insulin-requiring type 2 (due to lean body habitus: he is at risk for evolving type 1) ?Dx'ed: 2011 ?Complications: GP, CAD, PN, PAD, foot ulcer, and DR ?Therapy: insulin since 2013 ?DKA: never ?Severe hypoglycemia: never ?Pancreatitis: never ?Pancreatic imaging: normal on 2020 CT ?SDOH: Sister provides some hx, due to pt's poor health and memory loss.   ?Other: He eats meals at 9AM, 1PM, and 6PM.     ?Interval history: Pt takes these settings: ?basal rate of 0.5 units/hr.  ?bolus of 4-7 units/meal.  ?correction bolus (which some people call "sensitivity," or "insulin sensitivity ratio," or just "isr") of 1 unit for each 100 by which your glucose exceeds 100.   ?TDD is 22 units (52% basal).  He takes 2.4 boluses/day ?I reviewed continuous glucose monitor data.  It ends 04/02/21.  Glucose varies from 110-370.  It is in general highest at [facility], and lowest at [place].  It decreases overnight, but it increases 1PM-3PM   ?Pt says cbg went as low as 30, after he accidentally gave himself 2 boluses.  This happened at HS.  However, he seldom has hypoglycemia.  Pt says he often misses lunch bolus.   ?Past Medical History:  ?Diagnosis Date  ? Anxiety   ? Arthritis   ? Cataract   ? CHF (congestive heart failure) ([place])   ? Constipation   ? pt states goes EOD- hard stools   ? Coronary artery disease   ? Depression   ? Diabetes mellitus without complication ([place])   ? Diabetic retinopathy ([place])   ? DKA (diabetic ketoacidoses)   ? Hyperlipidemia   ? Hypertension   ? off meds  ? Hypertensive retinopathy   ? Psoriasis   ? Tubular adenoma of colon 03/2019  ? ? ?Past Surgical History:  ?Procedure Laterality Date  ? ABDOMINAL AORTOGRAM W/LOWER EXTREMITY N/A 08/30/2020  ? Procedure: ABDOMINAL AORTOGRAM W/LOWER EXTREMITY;  Surgeon: Serafina Mitchell, MD;  Location: [facility];   Service: Cardiovascular;  Laterality: N/A;  ? ABDOMINAL AORTOGRAM W/LOWER EXTREMITY N/A 11/15/2020  ? Procedure: ABDOMINAL AORTOGRAM W/LOWER EXTREMITY;  Surgeon: Serafina Mitchell, MD;  Location: [facility];  Service: Cardiovascular;  Laterality: N/A;  ? ANKLE SURGERY    ? right  ? CARDIAC CATHETERIZATION  09/25/2001  ? COLONOSCOPY    ? ~10 yr ago- normal  per pt   ? CORONARY ARTERY BYPASS GRAFT  09/30/2001  ? CABG x 4  ? IR THORACENTESIS ASP PLEURAL SPACE W/IMG GUIDE  05/19/2020  ? LAPAROSCOPIC INGUINAL HERNIA REPAIR Bilateral 02/09/2010  ? MASS EXCISION Left 03/12/2013  ? Procedure: LEFT INDEX EXCISION MASS AND DEBRIDMENT DISTAL INTERPHALANGEAL JOINT;  Surgeon: Tennis Must, MD;  Location: [facility];  Service: Orthopedics;  Laterality: Left;  ? PERIPHERAL VASCULAR BALLOON ANGIOPLASTY Left 08/30/2020  ? Procedure: PERIPHERAL VASCULAR BALLOON ANGIOPLASTY;  Surgeon: Serafina Mitchell, MD;  Location: [facility];  Service: Cardiovascular;  Laterality: Left;  Peroneal  ? ? ?Social History  ? ?Socioeconomic History  ? Marital status: Divorced  ?  Spouse name: Not on file  ? Number of children: 4  ? Years of education: 76  ? Highest education level: 12th grade  ?Occupational History  ? Occupation: Retired  ?Tobacco Use  ? Smoking status: Some Days  ?  Types: Cigars  ? Smokeless tobacco: Never  ?Vaping Use  ? Vaping Use: Never used  ?Substance and Sexual Activity  ? Alcohol use: Yes  ?  Comment: daily beer or scotch   ? Drug use: No  ? Sexual activity: Yes  ?Other Topics Concern  ? Not on file  ?Social History Narrative  ? Lives with his adult son [name] [name].  ? Has 4 children, 2 sons- both live in [place], 2 daughters one lives in [place], [place] and youngest daughter attends [employer].   ? Fiance lives in  [place], [place]  ? ?Social Determinants of Health  ? ?Financial Resource Strain: Not on file  ?Food Insecurity: Not on file  ?Transportation Needs: Not on file  ?Physical Activity:  Not on file  ?Stress: Not on file  ?Social Connections: Not on file  ?Intimate Partner Violence: Not on file  ? ? ?Current Outpatient Medications on File Prior to Visit  ?Medication Sig Dispense Refill  ? apixaban (ELIQUIS) 5 MG TABS tablet Take 1 tablet (5 mg total) by mouth 2 (two) times daily. 60 tablet 2  ? aspirin EC 81 MG EC tablet Take 1 tablet (81 mg total) by mouth daily. 30 tablet 0  ? atorvastatin (LIPITOR) 40 MG tablet Take 1 tablet (40 mg total) by mouth daily. 90 tablet 1  ? B Complex Vitamins (B COMPLEX-B12 PO) Take 1 tablet by mouth daily.     ? BD PEN NEEDLE NANO 2ND GEN 32G X 4 MM MISC USE FOR INSULIN 300 each 1  ? Blood Glucose Monitoring Suppl ([place]) w/Device KIT     ? Cholecalciferol (VITAMIN D) 125 MCG (5000 UT) CAPS Take 5,000 Units by mouth daily.     ? Continuous Blood Gluc Sensor (DEXCOM G6 SENSOR) MISC 1 Device by Does not apply route See admin instructions. Change every 10 days 9 each 3  ? Continuous Blood Gluc Transmit (DEXCOM G6 TRANSMITTER) MISC Use as instructed to check blood sugar 1 each 1  ? furosemide (LASIX) 40 MG tablet Take 1 tablet (40 mg total) by mouth daily. 90 tablet 1  ? Incontinence Supply Disposable (DISPOSABLE BRIEF LARGE) MISC 1 each by Does not apply route as needed. 36 each 5  ? insulin aspart (NOVOLOG) 100 UNIT/ML injection For use in pump, total of 40 units per day 50 mL 0  ? Insulin Disposable Pump (OMNIPOD 5 G6 INTRO, GEN 5,) KIT 1 Device by Does not apply route every 3 (three) days. 30 kit 3  ? Lancets (ONETOUCH DELICA PLUS [id number]) MISC Apply 1 each topically 4 (four) times daily.    ? loperamide (IMODIUM A-D) 2 MG tablet Take 1 tablet (2 mg total) by mouth 4 (four) times daily as needed for diarrhea or loose stools. 30 tablet 2  ? losartan (COZAAR) 25 MG tablet Take 1 tablet (25 mg total) by mouth daily. 90 tablet 1  ? mirtazapine (REMERON) 15 MG tablet Take 1 tablet (15 mg total) by mouth at bedtime. 90 tablet 1  ? ONETOUCH VERIO test  strip 1 each 4 (four) times daily.    ? PARoxetine (PAXIL) 20 MG tablet Take 1 tablet (20 mg total) by mouth daily. 90 tablet 1  ? spironolactone (ALDACTONE) 25 MG tablet Take 1 tablet (25 mg total) by mouth daily. 30 tablet 0  ? ?No current facility-administered medications on file prior to visit.  ? ? ?No Known Allergies ? ?Family History  ?Problem Relation Age of Onset  ?  Lymphoma Father   ? Cancer Paternal Grandmother   ?     Colon Cancer  ? Colon cancer Paternal Grandmother   ? Colon polyps Neg Hx   ? Esophageal cancer Neg Hx   ? Rectal cancer Neg Hx   ? Stomach cancer Neg Hx   ? Diabetes Mellitus I Neg Hx   ? Pancreatic cancer Neg Hx   ? ? ?BP 100/64 (BP Location: Left Arm, Patient Position: Sitting, Cuff Size: Normal)   Pulse 71   Ht _0  (1.88 m)   Wt 175 lb (79.4 kg)   SpO2 98%   BMI 22.47 kg/m?  ? ? ?Review of Systems ? ?   ?Objective:  ? Physical Exam ?VITAL SIGNS:  See vs page ?GENERAL: no distress ? ? ? ?A1c=9.2% ?   ?Assessment & Plan:  ?Insulin-requiring type 2 DM: uncontrolled.  Total daily bolus amount is less than that reported by pt.   ? ?Patient Instructions  ?check your blood sugar twice a day.  vary the time of day when you check, between before the 3 meals, and at bedtime.  also check if you have symptoms of your blood sugar being too high or too low.  please keep a record of the readings and bring it to your next appointment here (or you can bring the meter itself).  You can write it on any piece of paper.  please call us sooner if your blood sugar goes below 70, or if most of your readings are over 200.   ?Please take these pump settings: ?basal rate of 0.5 units/hr.  ?bolus of 4-7 units 3 times a day (just before each meal).  ?correction bolus (which some people call "sensitivity," or "insulin sensitivity ratio," or just "isr") of 1 unit for each 100 by which your glucose exceeds 100.   ?You should have an endocrinology follow-up appointment in 3 months.   ? ? ?

## 2021-04-25 ENCOUNTER — Other Ambulatory Visit: Payer: Self-pay | Admitting: Physician Assistant

## 2021-04-25 DIAGNOSIS — L89150 Pressure ulcer of sacral region, unstageable: Secondary | ICD-10-CM | POA: Diagnosis not present

## 2021-04-25 DIAGNOSIS — S42102D Fracture of unspecified part of scapula, left shoulder, subsequent encounter for fracture with routine healing: Secondary | ICD-10-CM | POA: Diagnosis not present

## 2021-04-26 ENCOUNTER — Encounter (HOSPITAL_BASED_OUTPATIENT_CLINIC_OR_DEPARTMENT_OTHER): Payer: HMO | Attending: General Surgery | Admitting: General Surgery

## 2021-04-26 DIAGNOSIS — L97512 Non-pressure chronic ulcer of other part of right foot with fat layer exposed: Secondary | ICD-10-CM | POA: Diagnosis not present

## 2021-04-26 DIAGNOSIS — L89152 Pressure ulcer of sacral region, stage 2: Secondary | ICD-10-CM | POA: Diagnosis not present

## 2021-04-26 DIAGNOSIS — E785 Hyperlipidemia, unspecified: Secondary | ICD-10-CM | POA: Diagnosis not present

## 2021-04-26 DIAGNOSIS — I10 Essential (primary) hypertension: Secondary | ICD-10-CM | POA: Diagnosis not present

## 2021-04-26 DIAGNOSIS — L97528 Non-pressure chronic ulcer of other part of left foot with other specified severity: Secondary | ICD-10-CM | POA: Diagnosis not present

## 2021-04-26 DIAGNOSIS — Z7901 Long term (current) use of anticoagulants: Secondary | ICD-10-CM | POA: Diagnosis not present

## 2021-04-26 DIAGNOSIS — Z951 Presence of aortocoronary bypass graft: Secondary | ICD-10-CM | POA: Insufficient documentation

## 2021-04-26 DIAGNOSIS — L97511 Non-pressure chronic ulcer of other part of right foot limited to breakdown of skin: Secondary | ICD-10-CM | POA: Insufficient documentation

## 2021-04-26 DIAGNOSIS — L89622 Pressure ulcer of left heel, stage 2: Secondary | ICD-10-CM | POA: Diagnosis not present

## 2021-04-26 DIAGNOSIS — E1142 Type 2 diabetes mellitus with diabetic polyneuropathy: Secondary | ICD-10-CM | POA: Diagnosis not present

## 2021-04-26 DIAGNOSIS — E1151 Type 2 diabetes mellitus with diabetic peripheral angiopathy without gangrene: Secondary | ICD-10-CM | POA: Diagnosis not present

## 2021-04-26 DIAGNOSIS — E11621 Type 2 diabetes mellitus with foot ulcer: Secondary | ICD-10-CM | POA: Diagnosis not present

## 2021-04-26 DIAGNOSIS — I2510 Atherosclerotic heart disease of native coronary artery without angina pectoris: Secondary | ICD-10-CM | POA: Insufficient documentation

## 2021-04-26 NOTE — Progress Notes (Signed)
LUCAH, [name] ([id number]) ?Visit Report for 04/26/2021 ?Arrival Information Details ?Patient Name: Date of Service: ?[name] [name] HNA THA N L. 04/26/2021 9:15 A M ?Medical Record Number: [id number] ?Patient Account Number: [id number] ?Date of Birth/Sex: Treating RN: ?05-08-1955 (66 y.o. [name] [name] ?Primary Care Ambyr Qadri: [name], Alyssa Other Clinician: ?Referring Zayvian Mcmurtry: ?Treating Tziporah Knoke/Extender: Fredirick Maudlin ?[name], Alyssa ?Weeks in Treatment: 41 ?Visit Information History Since Last Visit ?Added or deleted any medications: No ?Patient Arrived: [name] [name] ?Any new allergies or adverse reactions: No ?Arrival Time: 09:12 ?Had a fall or experienced change in No ?Accompanied By: mother ?activities of daily living that may affect ?Transfer Assistance: None ?risk of falls: ?Patient Identification Verified: Yes ?Signs or symptoms of abuse/neglect since last visito No ?Secondary Verification Process Completed: Yes ?Hospitalized since last visit: No ?Patient Requires Transmission-Based Precautions: No ?Implantable device outside of the clinic excluding No ?Patient Has Alerts: Yes ?cellular tissue based products placed in the center ?Patient Alerts: Patient on Blood Thinner since last visit: ?Has Dressing in Place as Prescribed: Yes ?Pain Present Now: No ?Electronic Signature(s) ?Signed: 04/26/2021 5:38:47 PM By: Levan Hurst RN, BSN ?Entered By: Levan Hurst on 04/26/2021 09:12:42 ?-------------------------------------------------------------------------------- ?Clinic Level of Care Assessment Details ?Patient Name: Date of Service: ?[name] [name] HNA THA N L. 04/26/2021 9:15 A M ?Medical Record Number: [id number] ?Patient Account Number: [id number] ?Date of Birth/Sex: Treating RN: ?Nov 27, 1955 (66 y.o. [name] [name] ?Primary Care Hanako Tipping: [name], Alyssa Other Clinician: ?Referring Keagan Brislin: ?Treating Syaire Saber/Extender: Fredirick Maudlin ?[name], Alyssa ?Weeks in Treatment: 41 ?Clinic Level of Care  Assessment Items ?TOOL 4 Quantity Score ?X- 1 0 ?Use when only an EandM is performed on FOLLOW-UP visit ?ASSESSMENTS - Nursing Assessment / Reassessment ?X- 1 10 ?Reassessment of Co-morbidities (includes updates in patient status) ?X- 1 5 ?Reassessment of Adherence to Treatment Plan ?ASSESSMENTS - Wound and Skin A ssessment / Reassessment ?X - Simple Wound Assessment / Reassessment - one wound 1 5 ?'[]'$  - 0 ?Complex Wound Assessment / Reassessment - multiple wounds ?'[]'$  - 0 ?Dermatologic / Skin Assessment (not related to wound area) ?ASSESSMENTS - Focused Assessment ?'[]'$  - 0 ?Circumferential Edema Measurements - multi extremities ?'[]'$  - 0 ?Nutritional Assessment / Counseling / Intervention ?X- 1 5 ?Lower Extremity Assessment (monofilament, tuning fork, pulses) ?'[]'$  - 0 ?Peripheral Arterial Disease Assessment (using hand held doppler) ?ASSESSMENTS - Ostomy and/or Continence Assessment and Care ?'[]'$  - 0 ?Incontinence Assessment and Management ?'[]'$  - 0 ?Ostomy Care Assessment and Management (repouching, etc.) ?PROCESS - Coordination of Care ?X - Simple Patient / Family Education for ongoing care 1 15 ?'[]'$  - 0 ?Complex (extensive) Patient / Family Education for ongoing care ?X- 1 10 ?Staff obtains Consents, Records, T Results / Process Orders ?est ?X- 1 10 ?Staff telephones HHA, Nursing Homes / Clarify orders / etc ?'[]'$  - 0 ?Routine Transfer to another Facility (non-emergent condition) ?'[]'$  - 0 ?Routine Hospital Admission (non-emergent condition) ?'[]'$  - 0 ?New Admissions / Biomedical engineer / Ordering NPWT Apligraf, etc. ?, ?'[]'$  - 0 ?Emergency Hospital Admission (emergent condition) ?X- 1 10 ?Simple Discharge Coordination ?'[]'$  - 0 ?Complex (extensive) Discharge Coordination ?PROCESS - Special Needs ?'[]'$  - 0 ?Pediatric / Minor Patient Management ?'[]'$  - 0 ?Isolation Patient Management ?'[]'$  - 0 ?Hearing / Language / Visual special needs ?'[]'$  - 0 ?Assessment of Community assistance (transportation, D/C planning, etc.) ?'[]'$  -  0 ?Additional assistance / Altered mentation ?'[]'$  - 0 ?Support Surface(s) Assessment (bed, cushion, seat, etc.) ?INTERVENTIONS - Wound Cleansing / Measurement ?X - Simple Wound Cleansing -  one wound 1 5 ?'[]'$  - 0 ?Complex Wound Cleansing - multiple wounds ?X- 1 5 ?Wound Imaging (photographs - any number of wounds) ?'[]'$  - 0 ?Wound Tracing (instead of photographs) ?X- 1 5 ?Simple Wound Measurement - one wound ?'[]'$  - 0 ?Complex Wound Measurement - multiple wounds ?INTERVENTIONS - Wound Dressings ?X - Small Wound Dressing one or multiple wounds 1 10 ?'[]'$  - 0 ?Medium Wound Dressing one or multiple wounds ?'[]'$  - 0 ?Large Wound Dressing one or multiple wounds ?'[]'$  - 0 ?Application of Medications - topical ?'[]'$  - 0 ?Application of Medications - injection ?INTERVENTIONS - Miscellaneous ?'[]'$  - 0 ?External ear exam ?'[]'$  - 0 ?Specimen Collection (cultures, biopsies, blood, body fluids, etc.) ?'[]'$  - 0 ?Specimen(s) / Culture(s) sent or taken to Lab for analysis ?'[]'$  - 0 ?Patient Transfer (multiple staff / Civil Service fast streamer / Similar devices) ?'[]'$  - 0 ?Simple Staple / Suture removal (25 or less) ?'[]'$  - 0 ?Complex Staple / Suture removal (26 or more) ?'[]'$  - 0 ?Hypo / Hyperglycemic Management (close monitor of Blood Glucose) ?'[]'$  - 0 ?Ankle / Brachial Index (ABI) - do not check if billed separately ?X- 1 5 ?Vital Signs ?Has the patient been seen at the hospital within the last three years: Yes ?Total Score: 100 ?Level Of Care: New/Established - Level 3 ?Electronic Signature(s) ?Signed: 04/26/2021 5:38:47 PM By: Levan Hurst RN, BSN ?Entered By: Levan Hurst on 04/26/2021 11:56:16 ?-------------------------------------------------------------------------------- ?Encounter Discharge Information Details ?Patient Name: Date of Service: ?[name] [name] HNA THA N L. 04/26/2021 9:15 A M ?Medical Record Number: [id number] ?Patient Account Number: [id number] ?Date of Birth/Sex: Treating RN: ?[birth year] (66 y.o. [name] [name] ?Primary Care Jinan Biggins: [name], Alyssa  Other Clinician: ?Referring Pepe Mineau: ?Treating Bartlett Enke/Extender: Fredirick Maudlin ?[name], Alyssa ?Weeks in Treatment: 41 ?Encounter Discharge Information Items ?Discharge Condition: Stable ?Ambulatory Status: [name] [name] ?Discharge Destination: Home ?Transportation: Private Auto ?Accompanied By: mother ?Schedule Follow-up Appointment: Yes ?Clinical Summary of Care: Patient Declined ?Electronic Signature(s) ?Signed: 04/26/2021 5:38:47 PM By: Levan Hurst RN, BSN ?Entered By: Levan Hurst on 04/26/2021 11:57:38 ?-------------------------------------------------------------------------------- ?Lower Extremity Assessment Details ?Patient Name: Date of Service: ?[name] [name] HNA THA N L. 04/26/2021 9:15 A M ?Medical Record Number: [id number] ?Patient Account Number: [id number] ?Date of Birth/Sex: Treating RN: ?April 27, 1955 (66 y.o. [name] [name] ?Primary Care Abrahm Mancia: [name], Alyssa Other Clinician: ?Referring Alixandrea Milleson: ?Treating Abid Bolla/Extender: Fredirick Maudlin ?[name], Alyssa ?Weeks in Treatment: 41 ?Edema Assessment ?Assessed: [Left: No] [Right: No] ?Edema: [Left: Ye] [Right: s] ?Calf ?Left: Right: ?Point of Measurement: 34 cm From Medial Instep 35 cm ?Ankle ?Left: Right: ?Point of Measurement: 10 cm From Medial Instep 25 cm ?Vascular Assessment ?Pulses: ?Dorsalis Pedis ?Palpable: [Right:Yes] ?Electronic Signature(s) ?Signed: 04/26/2021 5:38:47 PM By: Levan Hurst RN, BSN ?Entered By: Levan Hurst on 04/26/2021 09:15:57 ?-------------------------------------------------------------------------------- ?Multi Wound Chart Details ?Patient Name: ?Date of Service: ?[name] [name] HNA THA N L. 04/26/2021 9:15 A M ?Medical Record Number: [id number] ?Patient Account Number: [id number] ?Date of Birth/Sex: ?Treating RN: ?[birth year] (66 y.o. [name] [name] ?Primary Care Dareen Gutzwiller: [name], Alyssa ?Other Clinician: ?Referring Chalese Peach: ?Treating Biruk Troia/Extender: Fredirick Maudlin ?[name], Alyssa ?Weeks in Treatment:  41 ?Vital Signs ?Height(in): 74 ?Capillary Blood Glucose(mg/dl): 128 ?Weight(lbs): 151 ?Pulse(bpm): 67 ?Body Mass Index(BMI): 19.4 ?Blood Pressure(mmHg): 98/62 ?Temperature(??F): 98.7 ?Respiratory Rate(breaths/min): 18 ?

## 2021-04-26 NOTE — Progress Notes (Addendum)
IRVIN, BASTIN ([id number]) ?Visit Report for 04/26/2021 ?Chief Complaint Document Details ?Patient Name: Date of Service: ?[name] HNA THA N L. 04/26/2021 9:15 A M ?Medical Record Number: [id number] ?Patient Account Number: [id number] ?Date of Birth/Sex: Treating RN: ?[birth year] (66 y.o. [name] ?Primary Care Provider: [name], Alyssa Other Clinician: ?Referring Provider: ?Treating Provider/Extender: Fredirick Maudlin ?[name], Alyssa ?Weeks in Treatment: 41 ?Information Obtained from: Patient ?Chief Complaint ?07/07/2020; patient is in clinic for today for review of pressure ulcers on his coccyx and left heel and diabetic foot ulcers bilaterally. ?Electronic Signature(s) ?Signed: 04/26/2021 9:35:54 AM By: Fredirick Maudlin MD FACS ?Entered By: Fredirick Maudlin on 04/26/2021 09:35:54 ?-------------------------------------------------------------------------------- ?HPI Details ?Patient Name: Date of Service: ?[name] HNA THA N L. 04/26/2021 9:15 A M ?Medical Record Number: [id number] ?Patient Account Number: [id number] ?Date of Birth/Sex: Treating RN: ?[birth year] (66 y.o. [name] ?Primary Care Provider: [name], Alyssa Other Clinician: ?Referring Provider: ?Treating Provider/Extender: Fredirick Maudlin ?[name], Alyssa ?Weeks in Treatment: 41 ?History of Present Illness ?HPI Description: ADMISSION ?07/07/2020 ?This is a 66 year old man who is a type II diabetic. I have reviewed this in epic. It appeared that his diabetes was diagnosed in 2009 at which time he was on ?metformin. He was followed for a period of time by an endocrinologist with [facility] to always labeled him as a type II diabetic. Recently in epic there is been a ?switch to type 1 diabetes. If there were defining antibody test done I do not see them and I think the totality of the evidence would suggest that he is a type II ?diabetic. ?His recent problem started in May when he fell down 17 stairs. He suffered a scapular fracture he was  hospitalized at [facility] for 9 days and then discharged to ?[facility]. His mother who is present said that most of these wounds seem to develop surrounding the discharge from hospital or the stay at [facility]. He has an area on his lower sacrum which appears currently to be a stage II but per their description this is actually improved quite a bit. He has an ?area on the left lateral foot at roughly the base of the fifth metatarsal, the left Achilles heel and the right fifth metatarsal head laterally they have been using ?topical antibiotics. Seen by primary care yesterday and placed on Keflex ?The patient had arterial studies on 05/05/2020. I wondered whether this had something to do with wounds on his feet at that time but I have not been able to ?determine this. At that time his ABI on the right was 1.09 with a TBI of 0.65 on the left 0.88 with a TBI of 0.44. Waveforms were triphasic on the right ?monophasic on the left. He likely has significant PAD on the left ?Past medical history includes type 2 diabetes poorly controlled last hemoglobin A1c at 12.7 in May, hypertension, hyperlipidemia, atrial fib on Eliquis, coronary ?artery disease status post CABG, left scapular fracture in May of this year, AAA repair. Bilateral leg weakness ?ABIs were not done in our clinic as they were just done in May of this [facility] ?7/14; patient arrives back in clinic he has areas on the left Achilles heel, base of the left fifth metatarsal, right lateral fifth metatarsal. The area he had over ?the coccyx appears to be healed but this area remains vulnerable. We have been using Iodoflex. ?I rereviewed his ABIs which were done in May. I do not know exactly what prompted these test  however they do show a TBI of 0.44 on the left and monophasic ?waveforms. ?7/21; the patient's wounds are about the same. Certainly no healing and the nurses noted some green drainage on the right fifth metatarsal head, left Achilles  a ?little larger. We have been using silver alginate really without any improvement. ?We did get an appointment with vascular surgery however its that it did the end of August and they seem to want to repeat the noninvasive test that they ?have already done in May. I am not sure what the issue here is. We advised the patient's sister to call and point to the idea that they have already had ?noninvasive studies. The real question I have is does this patient need an angiogram. He is a diabetic and it is possible that the ABIs are falsely elevated ?because of medial calcification. At least on the left his TBI was abnormal ?08/08/2020 upon evaluation today patient appears to be doing about the same in regard to his wounds. He still waiting the appointment with vascular. This is ?something that hopefully should be undertaken shortly. The 29th of this month is when he is actually scheduled although they have put in a call to move this ?up if at all possible. Fortunately there does not appear to be any signs of active infection at this time. No fevers, chills, nausea, vomiting, or diarrhea. ?8/18; appointment with Dr. Trula Slade on 8/29. The patient has wounds on the left Achilles heel, left lateral foot and the right lateral fifth metatarsal head. We ?have been using Santyl under Hydrofera Blue. The surface of especially the left heel looks better to me than what I remember. ?9/1; since the patient was last here he underwent angiography by Dr. Trula Slade he also underwent noninvasive studies first showing a ABI of of 0.64 with ?monophasic waveforms. His TBI was 0.35 with a left great toe pressure of 46 this is about the same pattern as we saw previously. The left was a lot worse ?than the right. He underwent an angiogram on 08/29/2020. 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 An attempt was made at ?recanalization of the anterior tibial artery however this was unsuccessful. Noted that the peroneal artery reconstitutes the posterior tibial artery and the anterior ?tibial artery creating the plantar arch. ?The patient has areas on the Achilles left heel left lateral foot and right lateral fifth metatarsal head we have been using Santyl and Hydrofera Blue ?9/22; since the patient was last here he went for repeat vascular studies. On the right his ABI was 0.94 with biphasic waveforms TBI of 0.73 with a great toe ?pressure of 75. On the left which is the problematic wound his ABI was 1.08 but with biphasic waveforms at the PTA. Biphasic waveforms at the dorsalis pedis ?his TBI was 0.63 with a great toe pressure of 65. On the left however this does represent a fairly marked improvement. We have been using Santyl and ?Hydrofera Blue ?9/29 left Achilles area. Still is having necrotic surface. Bleeds very easily as the patient is on a combination of Eliquis aspirin and Plavix just stopped ?yesterday. I am not going to do any debridement until next week. We are using Santyl and Hydrofera Blue. ?His arterial status on the right was actually quite good. The area on the fifth metatarsal head needs additional debridement. A  total contact cast in this area is ?not out of the question. He does describe claudication I believe with minimal activity. He is not very active ?X-ray we ordered last week is still pending in terms of the report ?10/6 left Achilles area. In terms of the necrotic surface this is quite a bit better. There is still some debris in the posterior part of the wound however this looks ?better. Anteriorly the wound appears to be epithelialized there is still a divot in the middle of this but this does not probe to deep tissue. The area on the right ?fifth metatarsal head laterally really is not making as much progress as I would have liked. Still not  completely a viable surface. Both the patient and his wife ?agree that he is not an external rotator in terms of ambulation [an outie]. We have been using Santyl and Hydrofera Blue for a prolonged period. On

## 2021-04-27 ENCOUNTER — Encounter: Payer: Self-pay | Admitting: Cardiovascular Disease

## 2021-04-27 ENCOUNTER — Ambulatory Visit: Payer: HMO | Admitting: Cardiovascular Disease

## 2021-04-27 VITALS — BP 126/84 | HR 67 | Ht 74.0 in | Wt 173.0 lb

## 2021-04-27 DIAGNOSIS — I739 Peripheral vascular disease, unspecified: Secondary | ICD-10-CM

## 2021-04-27 NOTE — Progress Notes (Signed)
Patient was apparently referred to me by Fredirick Maudlin at the wound care center.  He had previously been taken care of by Dr. Trula Slade for PAD.  He was unaware of why he was here.  He thought he was here for preoperative clearance before some surgery because of congestive heart failure.  His primary cardiologist is Dr. Darvin Neighbours although he was unaware of this.  He decided to leave without being evaluated. ? ?Lorretta Harp, M.D., [place], [facility], [place], [place] ?[address] ?[place], [place]  [address]  ?[phone] ?04/27/2021 ?10:02 AM . ?

## 2021-05-02 NOTE — Progress Notes (Signed)
?[facility] Note ? ?05/12/2021 ? ?  ? ?CHIEF COMPLAINT ?Patient presents for Retina Follow Up ? ?HISTORY OF PRESENT ILLNESS: ?[name] [name] is a 66 y.o. male who presents to the clinic today for:  ? ?HPI   ? ? Retina Follow Up   ?Patient presents with  Diabetic Retinopathy.  In both eyes.  This started 4 weeks ago.  I, the attending physician,  performed the HPI with the patient and updated documentation appropriately. ? ?  ?  ? ? Comments   ?Patient here for 4 weeks retina follow up for PDR OU. Patient states vision awful like it always is. OD pretty good. OS still foggy. No eye pain.  ? ?  ?  ?Last edited by [name], MD on 05/12/2021 11:47 AM.  ?  ?Pt states right eye vision is okay, left eye is still blurry, no new health concerns, he has an appt with cardiology on Monday ? ?Referring physician: ?[name], [name], PA-C ?[place],  [place] [address] ? ?HISTORICAL INFORMATION:  ? ?Selected notes from the [facility] ?Referred by Dr. [name] for PDR OU  ? ?CURRENT MEDICATIONS: ?No current outpatient medications on file. (Ophthalmic Drugs)  ? ?No current facility-administered medications for this visit. (Ophthalmic Drugs)  ? ?Current Outpatient Medications (Other)  ?Medication Sig  ? apixaban (ELIQUIS) 5 MG TABS tablet Take 1 tablet (5 mg total) by mouth 2 (two) times daily.  ? aspirin EC 81 MG EC tablet Take 1 tablet (81 mg total) by mouth daily.  ? atorvastatin (LIPITOR) 40 MG tablet Take 1 tablet (40 mg total) by mouth daily.  ? B Complex Vitamins (B COMPLEX-B12 PO) Take 1 tablet by mouth daily.   ? Cholecalciferol (VITAMIN D) 125 MCG (5000 UT) CAPS Take 5,000 Units by mouth daily.   ? Continuous Blood Gluc Sensor (DEXCOM G6 SENSOR) MISC 1 Device by Does not apply route See admin instructions. Change every 10 days  ? Continuous Blood Gluc Transmit (DEXCOM G6 TRANSMITTER) MISC Use as instructed to check blood sugar  ? furosemide (LASIX) 40 MG  tablet Take 1 tablet (40 mg total) by mouth daily.  ? Incontinence Supply Disposable (DISPOSABLE BRIEF LARGE) MISC 1 each by Does not apply route as needed.  ? insulin aspart (NOVOLOG) 100 UNIT/ML injection For use in pump, total of 40 units per day  ? Insulin Disposable Pump (OMNIPOD 5 G6 INTRO, GEN 5,) KIT 1 Device by Does not apply route every 3 (three) days.  ? loperamide (IMODIUM A-D) 2 MG tablet Take 1 tablet (2 mg total) by mouth 4 (four) times daily as needed for diarrhea or loose stools.  ? losartan (COZAAR) 25 MG tablet Take 1 tablet (25 mg total) by mouth daily.  ? mirtazapine (REMERON) 15 MG tablet Take 1 tablet (15 mg total) by mouth at bedtime.  ? PARoxetine (PAXIL) 20 MG tablet Take 1 tablet (20 mg total) by mouth daily.  ? spironolactone (ALDACTONE) 25 MG tablet TAKE ONE TABLET BY MOUTH DAILY  ? BD PEN NEEDLE NANO 2ND GEN 32G X 4 MM MISC USE FOR INSULIN (Patient not taking: Reported on 04/27/2021)  ? Blood Glucose Monitoring Suppl ([place]) w/Device KIT  (Patient not taking: Reported on 04/27/2021)  ? Lancets (ONETOUCH DELICA PLUS [id number]) MISC Apply 1 each topically 4 (four) times daily. (Patient not taking: Reported on 04/27/2021)  ? ONETOUCH VERIO test strip 1 each 4 (four) times  daily. (Patient not taking: Reported on 04/27/2021)  ? ?No current facility-administered medications for this visit. (Other)  ? ?REVIEW OF SYSTEMS: ?ROS   ?Positive for: Neurological, Skin, Musculoskeletal, Endocrine, Cardiovascular, Eyes ?Negative for: Constitutional, Gastrointestinal, Genitourinary, HENT, Respiratory, Psychiatric, Allergic/Imm, Heme/Lymph ?Last edited by Theodore Demark, COA on 05/12/2021  9:13 AM.  ?  ? ?ALLERGIES ?No Known Allergies ? ?PAST MEDICAL HISTORY ?Past Medical History:  ?Diagnosis Date  ? Anxiety   ? Arthritis   ? Cataract   ? CHF (congestive heart failure) ([place])   ? Constipation   ? pt states goes EOD- hard stools   ? Coronary artery disease   ? Depression   ? Diabetes  mellitus without complication ([place])   ? Diabetic retinopathy ([place])   ? DKA (diabetic ketoacidoses)   ? Hyperlipidemia   ? Hypertension   ? off meds  ? Hypertensive retinopathy   ? Psoriasis   ? Tubular adenoma of colon 03/2019  ? ?Past Surgical History:  ?Procedure Laterality Date  ? ABDOMINAL AORTOGRAM W/LOWER EXTREMITY N/A 08/30/2020  ? Procedure: ABDOMINAL AORTOGRAM W/LOWER EXTREMITY;  Surgeon: Serafina Mitchell, MD;  Location: [facility];  Service: Cardiovascular;  Laterality: N/A;  ? ABDOMINAL AORTOGRAM W/LOWER EXTREMITY N/A 11/15/2020  ? Procedure: ABDOMINAL AORTOGRAM W/LOWER EXTREMITY;  Surgeon: Serafina Mitchell, MD;  Location: [facility];  Service: Cardiovascular;  Laterality: N/A;  ? ANKLE SURGERY    ? right  ? CARDIAC CATHETERIZATION  09/25/2001  ? COLONOSCOPY    ? ~10 yr ago- normal  per pt   ? CORONARY ARTERY BYPASS GRAFT  09/30/2001  ? CABG x 4  ? IR THORACENTESIS ASP PLEURAL SPACE W/IMG GUIDE  05/19/2020  ? LAPAROSCOPIC INGUINAL HERNIA REPAIR Bilateral 02/09/2010  ? MASS EXCISION Left 03/12/2013  ? Procedure: LEFT INDEX EXCISION MASS AND DEBRIDMENT DISTAL INTERPHALANGEAL JOINT;  Surgeon: Tennis Must, MD;  Location: [facility];  Service: Orthopedics;  Laterality: Left;  ? PERIPHERAL VASCULAR BALLOON ANGIOPLASTY Left 08/30/2020  ? Procedure: PERIPHERAL VASCULAR BALLOON ANGIOPLASTY;  Surgeon: Serafina Mitchell, MD;  Location: [facility];  Service: Cardiovascular;  Laterality: Left;  Peroneal  ? ?FAMILY HISTORY ?Family History  ?Problem Relation Age of Onset  ? Lymphoma Father   ? Cancer Paternal Grandmother   ?     Colon Cancer  ? Colon cancer Paternal Grandmother   ? Colon polyps Neg Hx   ? Esophageal cancer Neg Hx   ? Rectal cancer Neg Hx   ? Stomach cancer Neg Hx   ? Diabetes Mellitus I Neg Hx   ? Pancreatic cancer Neg Hx   ? ?SOCIAL HISTORY ?Social History  ? ?Tobacco Use  ? Smoking status: Some Days  ?  Types: Cigars  ? Smokeless tobacco: Never  ?Vaping Use  ? Vaping  Use: Never used  ?Substance Use Topics  ? Alcohol use: Yes  ?  Comment: daily beer or scotch   ? Drug use: No  ?  ? ?  ?OPHTHALMIC EXAM: ?Base Eye Exam   ? ? Visual Acuity (Snellen - Linear)   ? ?   Right Left  ? Dist [place] 20/60 -2 CF at 2'  ? Dist ph [place] 20/50 +1 NI  ? ?  ?  ? ? Tonometry (Tonopen, 9:11 AM)   ? ?   Right Left  ? Pressure 09 06  ? ?  ?  ? ? Pupils   ? ?   Dark Light  Shape React APD  ? Right 3 2 Round Brisk None  ? Left 3 2 Round Brisk None  ? ?  ?  ? ? Visual Fields (Counting fingers)   ? ?   Left Right  ?  Full Full  ? ?  ?  ? ? Extraocular Movement   ? ?   Right Left  ?  Full, Ortho Full, Ortho  ? ?  ?  ? ? Neuro/Psych   ? ? Oriented x3: Yes  ? Mood/Affect: Normal  ? ?  ?  ? ? Dilation   ? ? Both eyes: 1.0% Mydriacyl, 2.5% Phenylephrine @ 9:11 AM  ? ?  ?  ? ?  ? ?Slit Lamp and Fundus Exam   ? ? External Exam   ? ?   Right Left  ? External Normal Normal  ? ?  ?  ? ? Slit Lamp Exam   ? ?   Right Left  ? Lids/Lashes Dermatochalasis - upper lid, Meibomian gland dysfunction Dermatochalasis - upper lid, Meibomian gland dysfunction  ? Conjunctiva/Sclera White and quiet White and quiet  ? Cornea arcus, 2-3+ Punctate epithelial erosions arcus, 2+ Punctate epithelial erosions  ? Anterior Chamber Deep and quiet Deep and quiet  ? Iris Round and dilated, No NVI Round and dilated, No NVI  ? Lens 2-3+ Nuclear sclerosis with brunescence, 2+ Cortical cataract 2-3+ Nuclear sclerosis with brunescence, 2-3+ Cortical cataract  ? Anterior Vitreous Vitreous syneresis, blood stained vitreous condensations persistent Vitreous syneresis, diffuse VH - concentrated centrally, blood stained vitreous condensations, pre-retinal heme collecting inferiorly  ? ?  ?  ? ? Fundus Exam   ? ?   Right Left  ? Disc Pink and Sharp, NVD - regressed No view  ? C/D Ratio 0.2 0.2  ? Macula Flat, good foveal reflex, scatterd MA, +cystic changes/edema temporal fovea and macula -- stably improved hazy view, grossly attached, +DBH, fibrosis  ?  Vessels attenuated, mild tortuosity, +NVE just inside ST arcades - improved, +fibrosis attenuated, Tortuous, +NVD; NVE along ST arcades  ? Periphery Attached; 360 DBH, good 360 PRP Hazy view, good visibility s

## 2021-05-04 DIAGNOSIS — F419 Anxiety disorder, unspecified: Secondary | ICD-10-CM | POA: Diagnosis not present

## 2021-05-04 DIAGNOSIS — M720 Palmar fascial fibromatosis [Dupuytren]: Secondary | ICD-10-CM | POA: Diagnosis not present

## 2021-05-04 DIAGNOSIS — N138 Other obstructive and reflux uropathy: Secondary | ICD-10-CM | POA: Diagnosis not present

## 2021-05-04 DIAGNOSIS — G8929 Other chronic pain: Secondary | ICD-10-CM | POA: Diagnosis not present

## 2021-05-04 DIAGNOSIS — E559 Vitamin D deficiency, unspecified: Secondary | ICD-10-CM | POA: Diagnosis not present

## 2021-05-04 DIAGNOSIS — K5904 Chronic idiopathic constipation: Secondary | ICD-10-CM | POA: Diagnosis not present

## 2021-05-04 DIAGNOSIS — I509 Heart failure, unspecified: Secondary | ICD-10-CM | POA: Diagnosis not present

## 2021-05-04 DIAGNOSIS — E11621 Type 2 diabetes mellitus with foot ulcer: Secondary | ICD-10-CM | POA: Diagnosis not present

## 2021-05-04 DIAGNOSIS — F17210 Nicotine dependence, cigarettes, uncomplicated: Secondary | ICD-10-CM | POA: Diagnosis not present

## 2021-05-04 DIAGNOSIS — M47812 Spondylosis without myelopathy or radiculopathy, cervical region: Secondary | ICD-10-CM | POA: Diagnosis not present

## 2021-05-04 DIAGNOSIS — M1990 Unspecified osteoarthritis, unspecified site: Secondary | ICD-10-CM | POA: Diagnosis not present

## 2021-05-04 DIAGNOSIS — E1151 Type 2 diabetes mellitus with diabetic peripheral angiopathy without gangrene: Secondary | ICD-10-CM | POA: Diagnosis not present

## 2021-05-04 DIAGNOSIS — N401 Enlarged prostate with lower urinary tract symptoms: Secondary | ICD-10-CM | POA: Diagnosis not present

## 2021-05-04 DIAGNOSIS — I480 Paroxysmal atrial fibrillation: Secondary | ICD-10-CM | POA: Diagnosis not present

## 2021-05-04 DIAGNOSIS — D649 Anemia, unspecified: Secondary | ICD-10-CM | POA: Diagnosis not present

## 2021-05-04 DIAGNOSIS — F3341 Major depressive disorder, recurrent, in partial remission: Secondary | ICD-10-CM | POA: Diagnosis not present

## 2021-05-04 DIAGNOSIS — E785 Hyperlipidemia, unspecified: Secondary | ICD-10-CM | POA: Diagnosis not present

## 2021-05-04 DIAGNOSIS — I2510 Atherosclerotic heart disease of native coronary artery without angina pectoris: Secondary | ICD-10-CM | POA: Diagnosis not present

## 2021-05-04 DIAGNOSIS — L97511 Non-pressure chronic ulcer of other part of right foot limited to breakdown of skin: Secondary | ICD-10-CM | POA: Diagnosis not present

## 2021-05-04 DIAGNOSIS — E113299 Type 2 diabetes mellitus with mild nonproliferative diabetic retinopathy without macular edema, unspecified eye: Secondary | ICD-10-CM | POA: Diagnosis not present

## 2021-05-04 DIAGNOSIS — Z794 Long term (current) use of insulin: Secondary | ICD-10-CM | POA: Diagnosis not present

## 2021-05-04 DIAGNOSIS — L409 Psoriasis, unspecified: Secondary | ICD-10-CM | POA: Diagnosis not present

## 2021-05-04 DIAGNOSIS — I110 Hypertensive heart disease with heart failure: Secondary | ICD-10-CM | POA: Diagnosis not present

## 2021-05-04 DIAGNOSIS — E1142 Type 2 diabetes mellitus with diabetic polyneuropathy: Secondary | ICD-10-CM | POA: Diagnosis not present

## 2021-05-05 ENCOUNTER — Telehealth: Payer: Self-pay | Admitting: Physician Assistant

## 2021-05-05 NOTE — Telephone Encounter (Signed)
Copied from [place] [id number]. Topic: Medicare AWV ?>> May 05, 2021  9:45 AM Harris-Coley, Hannah Beat wrote: ?Reason for CRM: Left message for patient to schedule Annual Wellness Visit.  Please schedule with Nurse Health Advisor Charlott Rakes, RN at [facility].  Please call [phone] ask for Juliann Pulse ?

## 2021-05-10 ENCOUNTER — Telehealth: Payer: Self-pay | Admitting: Nutrition

## 2021-05-10 DIAGNOSIS — L089 Local infection of the skin and subcutaneous tissue, unspecified: Secondary | ICD-10-CM

## 2021-05-10 MED ORDER — DEXCOM G6 SENSOR MISC: 1.0000 | 3 refills | Status: DC

## 2021-05-10 NOTE — Telephone Encounter (Signed)
Mother LVM that patient is out of Dexcom sensors and would like the prescription sent to a new Pharmacy:  [facility] on [address]. ?

## 2021-05-11 ENCOUNTER — Encounter: Payer: HMO | Admitting: Gastroenterology

## 2021-05-12 ENCOUNTER — Encounter (INDEPENDENT_AMBULATORY_CARE_PROVIDER_SITE_OTHER): Payer: Self-pay | Admitting: Ophthalmology

## 2021-05-12 ENCOUNTER — Ambulatory Visit (INDEPENDENT_AMBULATORY_CARE_PROVIDER_SITE_OTHER): Payer: HMO | Admitting: Ophthalmology

## 2021-05-12 DIAGNOSIS — H4312 Vitreous hemorrhage, left eye: Secondary | ICD-10-CM

## 2021-05-12 DIAGNOSIS — H35033 Hypertensive retinopathy, bilateral: Secondary | ICD-10-CM | POA: Diagnosis not present

## 2021-05-12 DIAGNOSIS — E113513 Type 2 diabetes mellitus with proliferative diabetic retinopathy with macular edema, bilateral: Secondary | ICD-10-CM | POA: Diagnosis not present

## 2021-05-12 DIAGNOSIS — H25813 Combined forms of age-related cataract, bilateral: Secondary | ICD-10-CM | POA: Diagnosis not present

## 2021-05-12 DIAGNOSIS — I10 Essential (primary) hypertension: Secondary | ICD-10-CM

## 2021-05-12 MED ORDER — BEVACIZUMAB CHEMO INJECTION 1.25MG/0.05ML SYRINGE FOR KALEIDOSCOPE
1.2500 mg | INTRAVITREAL | Status: AC | PRN
  Administered 2021-05-12: 1.25 mg via INTRAVITREAL

## 2021-05-14 NOTE — Progress Notes (Signed)
?Cardiology Office Note:   ?Date:  05/15/2021  ?NAME:  [name] [name]    ?MRN: [id number] ?DOB:  02/22/1955  ? ?PCP:  [name], [name], PA-C  ?Cardiologist:  Evalina Field, MD  ?Electrophysiologist:  None  ? ?Referring MD: Fredirick Lathe, PA-C  ? ?Chief Complaint  ?Patient presents with  ? Follow-up  ?   ?  ? ?History of Present Illness:   ?[name] [name] is a 66 y.o. male with a hx of CAD s/p CABG, PAD, DM, pAF, HFpEF, HTN who presents for evaluation of pAF at the request of [name], [name], PA-C.  He reports he is doing well.  Had bypass surgery in 2003.  Operative report was reviewed.  Underwent four-vessel CABG.  There is mention in the notes of aortic dissection repair at the time of CABG but I do not see that.  Reports no chest pain or trouble breathing.  Can walk up to 1/4 mile without limitations.  He does have lower extremity wounds.  Not healing.  These are diabetic foot ulcers.  He is undergone several peripheral interventions.  He does not want to follow with vein and vascular surgery.  I have recommended follow-up with Dr. Quay Burow.  He has had recurrent stenoses in the lower extremities.  Still having wounds.  Also has a history of heart failure with preserved ejection fraction.  Euvolemic on examination today.  Blood pressure is well controlled.  Diabetes uncontrolled with A1c 9.2.  His LDL cholesterol is at goal.  Most recent EKG demonstrates A-fib.  Examination today shows that he is in normal rhythm.  Denies any rapid heartbeat sensation.  Overall seems to be doing well. ? ?Problem List ?PAD ?-R PT stenosis -> balloon 11/2020 ?-L peroneal artery -> balloon/recurrent stenosis 11/2020 ?2. CAD s/p CABG ?-09/30/2001 CABG x 4 ?3. Paroxysmal Afib ?4. DM ?-A1c 9.2 ?5. HTN ?6. HFpEF ?7. HLD ?-T chol 78, HDL 39, LDL 34, TG 26 ? ?Past Medical History: ?Past Medical History:  ?Diagnosis Date  ? Anxiety   ? Arthritis   ? Cataract   ? CHF (congestive heart failure) ([place])   ?  Constipation   ? pt states goes EOD- hard stools   ? Coronary artery disease   ? Depression   ? Diabetes mellitus without complication ([place])   ? Diabetic retinopathy ([place])   ? DKA (diabetic ketoacidoses)   ? Hyperlipidemia   ? Hypertension   ? off meds  ? Hypertensive retinopathy   ? Psoriasis   ? Tubular adenoma of colon 03/2019  ? ? ?Past Surgical History: ?Past Surgical History:  ?Procedure Laterality Date  ? ABDOMINAL AORTOGRAM W/LOWER EXTREMITY N/A 08/30/2020  ? Procedure: ABDOMINAL AORTOGRAM W/LOWER EXTREMITY;  Surgeon: Serafina Mitchell, MD;  Location: [facility];  Service: Cardiovascular;  Laterality: N/A;  ? ABDOMINAL AORTOGRAM W/LOWER EXTREMITY N/A 11/15/2020  ? Procedure: ABDOMINAL AORTOGRAM W/LOWER EXTREMITY;  Surgeon: Serafina Mitchell, MD;  Location: [facility];  Service: Cardiovascular;  Laterality: N/A;  ? ANKLE SURGERY    ? right  ? CARDIAC CATHETERIZATION  09/25/2001  ? COLONOSCOPY    ? ~10 yr ago- normal  per pt   ? CORONARY ARTERY BYPASS GRAFT  09/30/2001  ? CABG x 4  ? IR THORACENTESIS ASP PLEURAL SPACE W/IMG GUIDE  05/19/2020  ? LAPAROSCOPIC INGUINAL HERNIA REPAIR Bilateral 02/09/2010  ? MASS EXCISION Left 03/12/2013  ? Procedure: LEFT INDEX EXCISION MASS AND DEBRIDMENT DISTAL INTERPHALANGEAL JOINT;  Surgeon: Tennis Must, MD;  Location: [facility];  Service: Orthopedics;  Laterality: Left;  ? PERIPHERAL VASCULAR BALLOON ANGIOPLASTY Left 08/30/2020  ? Procedure: PERIPHERAL VASCULAR BALLOON ANGIOPLASTY;  Surgeon: Serafina Mitchell, MD;  Location: [facility];  Service: Cardiovascular;  Laterality: Left;  Peroneal  ? ? ?Current Medications: ?Current Meds  ?Medication Sig  ? apixaban (ELIQUIS) 5 MG TABS tablet Take 1 tablet (5 mg total) by mouth 2 (two) times daily.  ? aspirin EC 81 MG EC tablet Take 1 tablet (81 mg total) by mouth daily.  ? atorvastatin (LIPITOR) 40 MG tablet Take 1 tablet (40 mg total) by mouth daily.  ? B Complex Vitamins (B COMPLEX-B12 PO) Take 1  tablet by mouth daily.   ? BD PEN NEEDLE NANO 2ND GEN 32G X 4 MM MISC USE FOR INSULIN  ? Blood Glucose Monitoring Suppl ([place]) w/Device KIT   ? Cholecalciferol (VITAMIN D) 125 MCG (5000 UT) CAPS Take 5,000 Units by mouth daily.   ? Continuous Blood Gluc Sensor (DEXCOM G6 SENSOR) MISC 1 Device by Does not apply route See admin instructions. Change every 10 days  ? Continuous Blood Gluc Transmit (DEXCOM G6 TRANSMITTER) MISC Use as instructed to check blood sugar  ? furosemide (LASIX) 40 MG tablet Take 1 tablet (40 mg total) by mouth daily.  ? Incontinence Supply Disposable (DISPOSABLE BRIEF LARGE) MISC 1 each by Does not apply route as needed.  ? insulin aspart (NOVOLOG) 100 UNIT/ML injection For use in pump, total of 40 units per day  ? Insulin Disposable Pump (OMNIPOD 5 G6 INTRO, GEN 5,) KIT 1 Device by Does not apply route every 3 (three) days.  ? Lancets (ONETOUCH DELICA PLUS [id number]) MISC Apply 1 each topically 4 (four) times daily.  ? loperamide (IMODIUM A-D) 2 MG tablet Take 1 tablet (2 mg total) by mouth 4 (four) times daily as needed for diarrhea or loose stools.  ? mirtazapine (REMERON) 15 MG tablet Take 1 tablet (15 mg total) by mouth at bedtime.  ? ONETOUCH VERIO test strip 1 each 4 (four) times daily.  ? PARoxetine (PAXIL) 20 MG tablet Take 1 tablet (20 mg total) by mouth daily.  ? spironolactone (ALDACTONE) 25 MG tablet TAKE ONE TABLET BY MOUTH DAILY  ?  ? ?Allergies:    ?Patient has no allergy information on record.  ? ?Social History: ?Social History  ? ?Socioeconomic History  ? Marital status: Divorced  ?  Spouse name: Not on file  ? Number of children: 4  ? Years of education: 32  ? Highest education level: 12th grade  ?Occupational History  ? Occupation: Retired  ?Tobacco Use  ? Smoking status: Some Days  ?  Types: Cigars  ? Smokeless tobacco: Never  ?Vaping Use  ? Vaping Use: Never used  ?Substance and Sexual Activity  ? Alcohol use: Yes  ?  Comment: daily beer or scotch   ?  Drug use: No  ? Sexual activity: Yes  ?Other Topics Concern  ? Not on file  ?Social History Narrative  ? Lives with his adult son [name] [name].  ? Has 4 children, 2 sons- both live in [place], 2 daughters one lives in [place], [place] and youngest daughter attends [employer].   ? Fiance lives in  [place], [place]  ? ?Social Determinants of Health  ? ?Financial Resource Strain: Not on file  ?Food Insecurity: Not on file  ?Transportation Needs: Not on file  ?Physical Activity:  Not on file  ?Stress: Not on file  ?Social Connections: Not on file  ?  ? ?Family History: ?The patient's family history includes Cancer in his paternal grandmother; Colon cancer in his paternal grandmother; Lymphoma in his father. There is no history of Colon polyps, Esophageal cancer, Rectal cancer, Stomach cancer, Diabetes Mellitus I, or Pancreatic cancer. ? ?ROS:   ?All other ROS reviewed and negative. Pertinent positives noted in the HPI.    ? ?EKGs/Labs/Other Studies Reviewed:   ?The following studies were personally reviewed by me today: ? ?EKG:  EKG dated 03/13/2021 was reviewed in office to demonstrate atrial fibrillation with heart rate 63, anteroseptal infarct. ? ?TTE 02/15/2021 ? 1. Left ventricular ejection fraction, by estimation, is 55 to 60%. The  ?left ventricle has normal function. The left ventricle has no regional  ?wall motion abnormalities. There is mild left ventricular hypertrophy.  ?Left ventricular diastolic parameters  ?are consistent with Grade I diastolic dysfunction (impaired relaxation).  ? 2. Right ventricular systolic function is mildly reduced. The right  ?ventricular size is normal.  ? 3. Left atrial size was mildly dilated.  ? 4. The mitral valve is normal in structure. Mild mitral valve  ?regurgitation. No evidence of mitral stenosis.  ? 5. The aortic valve is tricuspid. Aortic valve regurgitation is not  ?visualized. Aortic valve is calcified. In the parasternal long axis,  ?cannot rule out some  mobility with question of small vegetation. Not noted  ?in other views. Clinical correlation  ?suggested.  ? 6. The inferior vena cava is dilated in size with >50% respiratory  ?variability, suggesting right atrial pr

## 2021-05-15 ENCOUNTER — Encounter: Payer: Self-pay | Admitting: Cardiovascular Disease

## 2021-05-15 ENCOUNTER — Ambulatory Visit (INDEPENDENT_AMBULATORY_CARE_PROVIDER_SITE_OTHER): Payer: HMO | Admitting: Cardiovascular Disease

## 2021-05-15 VITALS — BP 110/61 | HR 54 | Ht 74.0 in | Wt 179.6 lb

## 2021-05-15 DIAGNOSIS — I25810 Atherosclerosis of coronary artery bypass graft(s) without angina pectoris: Secondary | ICD-10-CM | POA: Diagnosis not present

## 2021-05-15 DIAGNOSIS — E782 Mixed hyperlipidemia: Secondary | ICD-10-CM | POA: Diagnosis not present

## 2021-05-15 DIAGNOSIS — I5032 Chronic diastolic (congestive) heart failure: Secondary | ICD-10-CM

## 2021-05-15 DIAGNOSIS — I10 Essential (primary) hypertension: Secondary | ICD-10-CM

## 2021-05-15 DIAGNOSIS — I739 Peripheral vascular disease, unspecified: Secondary | ICD-10-CM | POA: Diagnosis not present

## 2021-05-15 DIAGNOSIS — I480 Paroxysmal atrial fibrillation: Secondary | ICD-10-CM

## 2021-05-15 NOTE — Patient Instructions (Signed)
Medication Instructions:  ?Your physician recommends that you continue on your current medications as directed. Please refer to the Current Medication list given to you today. ? ?*If you need a refill on your cardiac medications before your next appointment, please call your pharmacy* ? ? ?Lab Work: ?NONE ordered at this time of appointment  ? ?If you have labs (blood work) drawn today and your tests are completely normal, you will receive your results only by: ?MyChart Message (if you have MyChart) OR ?A paper copy in the mail ?If you have any lab test that is abnormal or we need to change your treatment, we will call you to review the results. ? ?Testing/Procedures: ?NONE ordered at this time of appointment  ? ?Follow-Up: ?At [facility], you and your health needs are our priority.  As part of our continuing mission to provide you with exceptional heart care, we have created designated Provider Care Teams.  These Care Teams include your primary Cardiologist (physician) and Advanced Practice Providers (APPs -  Physician Assistants and Nurse Practitioners) who all work together to provide you with the care you need, when you need it. ? ?Your next appointment:   ?1st available PV  ?6 month(s) ? ?The format for your next appointment:   ?In Person ?In Person ?Provider:   ? ?Lorretta Harp, MD ?Evalina Field, MD   ? ? ?Other Instructions ? ? ?Important Information About Sugar ? ? ? ? ? ? ?

## 2021-05-22 ENCOUNTER — Other Ambulatory Visit: Payer: Self-pay | Admitting: Internal Medicine

## 2021-05-22 ENCOUNTER — Other Ambulatory Visit: Payer: Self-pay | Admitting: Endocrinology

## 2021-05-22 DIAGNOSIS — E1165 Type 2 diabetes mellitus with hyperglycemia: Secondary | ICD-10-CM

## 2021-05-22 DIAGNOSIS — F3341 Major depressive disorder, recurrent, in partial remission: Secondary | ICD-10-CM

## 2021-05-23 ENCOUNTER — Telehealth: Payer: Self-pay

## 2021-05-23 NOTE — Telephone Encounter (Signed)
Spoke with pt's mother, Martin Majestic (ok per [facility]) regarding upcoming consult with Dr. Gwenlyn Found. Per chart pt has been seen by [facility] for these issues. Pt's mother states that they would like to change providers to Dr. Gwenlyn Found. Per pt's mother is planning to make his appointment tomorrow.

## 2021-05-24 ENCOUNTER — Ambulatory Visit: Payer: HMO | Admitting: Cardiovascular Disease

## 2021-05-24 ENCOUNTER — Encounter (HOSPITAL_BASED_OUTPATIENT_CLINIC_OR_DEPARTMENT_OTHER): Payer: HMO | Attending: General Surgery | Admitting: General Surgery

## 2021-05-24 DIAGNOSIS — E11621 Type 2 diabetes mellitus with foot ulcer: Secondary | ICD-10-CM | POA: Insufficient documentation

## 2021-05-24 DIAGNOSIS — I10 Essential (primary) hypertension: Secondary | ICD-10-CM | POA: Diagnosis not present

## 2021-05-24 DIAGNOSIS — E1142 Type 2 diabetes mellitus with diabetic polyneuropathy: Secondary | ICD-10-CM | POA: Insufficient documentation

## 2021-05-24 DIAGNOSIS — E1151 Type 2 diabetes mellitus with diabetic peripheral angiopathy without gangrene: Secondary | ICD-10-CM | POA: Diagnosis not present

## 2021-05-24 DIAGNOSIS — M1990 Unspecified osteoarthritis, unspecified site: Secondary | ICD-10-CM | POA: Diagnosis not present

## 2021-05-24 DIAGNOSIS — L97512 Non-pressure chronic ulcer of other part of right foot with fat layer exposed: Secondary | ICD-10-CM | POA: Diagnosis not present

## 2021-05-24 DIAGNOSIS — L97511 Non-pressure chronic ulcer of other part of right foot limited to breakdown of skin: Secondary | ICD-10-CM | POA: Insufficient documentation

## 2021-05-24 NOTE — Progress Notes (Signed)
[name], [name] ([id number]) Visit Report for 05/24/2021 Arrival Information Details Patient Name: Date of Service: [name] [name] [facility] THA N L. 05/24/2021 9:15 A M Medical Record Number: [id number] Patient Account Number: [id number] Date of Birth/Sex: Treating RN: 17-Jun-1955 (66 y.o. [name] [name] Primary Care Bayler Gehrig: [name], Alyssa Other Clinician: Referring Sena Clouatre: Treating Jerol Rufener/Extender: Orland Jarred in Treatment: 41 Visit Information History Since Last Visit Added or deleted any medications: No Patient Arrived: [name] Any new allergies or adverse reactions: No Arrival Time: 08:58 Had a fall or experienced change in No Accompanied By: mother activities of daily living that may affect Transfer Assistance: None risk of falls: Patient Identification Verified: Yes Signs or symptoms of abuse/neglect since last visito No Secondary Verification Process Completed: Yes Hospitalized since last visit: No Patient Requires Transmission-Based Precautions: No Implantable device outside of the clinic excluding No Patient Has Alerts: Yes cellular tissue based products placed in the center Patient Alerts: Patient on Blood Thinner since last visit: Has Dressing in Place as Prescribed: Yes Pain Present Now: Yes Electronic Signature(s) Signed: 05/24/2021 3:12:02 PM By: Sandre Kitty Entered By: Sandre Kitty on 05/24/2021 08:59:21 -------------------------------------------------------------------------------- Encounter Discharge Information Details Patient Name: Date of Service: [name] [name] [facility] THA N L. 05/24/2021 9:15 A M Medical Record Number: [id number] Patient Account Number: [id number] Date of Birth/Sex: Treating RN: 1955-11-22 (66 y.o. [name] [name] Primary Care [name] [name]: [name], Alyssa Other Clinician: Referring [name] [name]: Treating Denise Bramblett/Extender: Thana Ates, Alyssa Weeks in Treatment: 24 Encounter Discharge  Information Items Post Procedure Vitals Discharge Condition: Stable Temperature (F): 98.9 Ambulatory Status: [name] Pulse (bpm): 51 Discharge Destination: Home Respiratory Rate (breaths/min): 18 Transportation: Private Auto Blood Pressure (mmHg): 131/69 Accompanied By: mother Schedule Follow-up Appointment: Yes Clinical Summary of Care: Patient Declined Electronic Signature(s) Signed: 05/24/2021 5:55:49 PM By: Adline Peals Entered By: Adline Peals on 05/24/2021 09:37:14 -------------------------------------------------------------------------------- Lower Extremity Assessment Details Patient Name: Date of Service: [name] [name] [facility] THA N L. 05/24/2021 9:15 A M Medical Record Number: [id number] Patient Account Number: [id number] Date of Birth/Sex: Treating RN: April 12, 1955 (66 y.o. [name] [name] Primary Care [name]: [name], Alyssa Other Clinician: Referring Cloyce Paterson: Treating [name] [name]/Extender: Fredirick Maudlin [name], Alyssa Weeks in Treatment: 45 Edema Assessment Assessed: [Left: No] [Right: No] Edema: [Left: Ye] [Right: s] Calf Left: Right: Point of Measurement: 34 cm From Medial Instep 32.8 cm Ankle Left: Right: Point of Measurement: 10 cm From Medial Instep 25.2 cm Vascular Assessment Pulses: Dorsalis Pedis Palpable: [Right:Yes] Electronic Signature(s) Signed: 05/24/2021 5:55:49 PM By: Adline Peals Entered By: Adline Peals on 05/24/2021 09:09:54 -------------------------------------------------------------------------------- Multi Wound Chart Details Patient Name: Date of Service: [name] [name] [facility] THA N L. 05/24/2021 9:15 A M Medical Record Number: [id number] Patient Account Number: [id number] Date of Birth/Sex: Treating RN: [birth year] (66 y.o. [name] [name] Primary Care [name] [name]: [name], Alyssa Other Clinician: Referring Navarro Nine: Treating [name]/Extender: Fredirick Maudlin [name], Alyssa Weeks in Treatment: 45 Vital  Signs Height(in): 74 Capillary Blood Glucose(mg/dl): 280 Weight(lbs): 151 Pulse(bpm): 64 Body Mass Index(BMI): 19.4 Blood Pressure(mmHg): 131/69 Temperature(F): 98.9 Respiratory Rate(breaths/min): 18 Photos: [3:Right, Lateral Foot] [N/A:N/A N/A] Wound Location: [3:Gradually Appeared] [N/A:N/A] Wounding Event: [3:Diabetic Wound/Ulcer of the Lower] [N/A:N/A] Primary Etiology: [3:Extremity Coronary Artery Disease,] [N/A:N/A] Comorbid History: [3:Hypertension, Type II Diabetes, Osteoarthritis, Neuropathy 06/21/2020] [N/A:N/A] Date Acquired: [3:45] [N/A:N/A] Weeks of Treatment: [3:Open] [N/A:N/A] Wound Status: [3:No] [N/A:N/A] Wound Recurrence: [3:1.1x1x0.1] [N/A:N/A] Measurements L x W x D (cm) [3:0.864] [N/A:N/A] A (cm) : rea [3:0.086] [N/A:N/A] Volume (cm) : [3:56.90%] [N/A:N/A] % Reduction in  A [3:rea: 78.60%] [N/A:N/A] % Reduction in Volume: [3:Grade 2] [N/A:N/A] Classification: [3:Medium] [N/A:N/A] Exudate A mount: [3:Serosanguineous] [N/A:N/A] Exudate Type: [3:red, brown] [N/A:N/A] Exudate Color: [3:Flat and Intact] [N/A:N/A] Wound Margin: [3:Large (67-100%)] [N/A:N/A] Granulation A mount: [3:Pink, Hyper-granulation, Friable] [N/A:N/A] Granulation Quality: [3:Small (1-33%)] [N/A:N/A] Necrotic A mount: [3:Fat Layer (Subcutaneous Tissue): Yes N/A] Exposed Structures: [3:Fascia: No Tendon: No Muscle: No Joint: No Bone: No Medium (34-66%)] [N/A:N/A] Epithelialization: [3:Debridement - Excisional] [N/A:N/A] Debridement: Pre-procedure Verification/Time Out 09:19 [N/A:N/A] Taken: [3:Other] [N/A:N/A] Pain Control: [3:Callus, Subcutaneous] [N/A:N/A] Tissue Debrided: [3:Skin/Subcutaneous Tissue] [N/A:N/A] Level: [3:1.1] [N/A:N/A] Debridement A (sq cm): [3:rea Curette] [N/A:N/A] Instrument: [3:Minimum] [N/A:N/A] Bleeding: [3:Pressure] [N/A:N/A] Hemostasis A chieved: [3:0] [N/A:N/A] Procedural Pain: [3:0] [N/A:N/A] Post Procedural Pain: [3:Procedure was tolerated well]  [N/A:N/A] Debridement Treatment Response: [3:1.1x1x0.1] [N/A:N/A] Post Debridement Measurements L x W x D (cm) [3:0.086] [N/A:N/A] Post Debridement Volume: (cm) [3:Debridement] [N/A:N/A] Treatment Notes Electronic Signature(s) Signed: 05/24/2021 9:28:53 AM By: Fredirick Maudlin MD FACS Signed: 05/24/2021 5:57:27 PM By: Levan Hurst RN, BSN Entered By: Fredirick Maudlin on 05/24/2021 09:28:53 -------------------------------------------------------------------------------- Multi-Disciplinary Care Plan Details Patient Name: Date of Service: [name] [name] [facility] THA N L. 05/24/2021 9:15 A M Medical Record Number: [id number] Patient Account Number: [id number] Date of Birth/Sex: Treating RN: 08/07/1955 (66 y.o. [name] [name] Primary Care Laquitha Heslin: [name], Alyssa Other Clinician: Referring Arash Karstens: Treating Jaevian Shean/Extender: Orland Jarred in Treatment: [place] reviewed with physician Active Inactive Wound/Skin Impairment Nursing Diagnoses: Knowledge deficit related to ulceration/compromised skin integrity Goals: Patient/caregiver will verbalize understanding of skin care regimen Date Initiated: 07/07/2020 Target Resolution Date: 06/23/2021 Goal Status: Active Interventions: Assess patient/caregiver ability to obtain necessary supplies Assess patient/caregiver ability to perform ulcer/skin care regimen upon admission and as needed Provide education on ulcer and skin care Treatment Activities: Skin care regimen initiated : 07/07/2020 Topical wound management initiated : 07/07/2020 Notes: Electronic Signature(s) Signed: 05/24/2021 5:55:49 PM By: Adline Peals Entered By: Adline Peals on 05/24/2021 09:11:25 -------------------------------------------------------------------------------- Pain Assessment Details Patient Name: Date of Service: [name] [name] [facility] THA N L. 05/24/2021 9:15 A M Medical Record Number:  [id number] Patient Account Number: [id number] Date of Birth/Sex: Treating RN: 1955-08-06 (66 y.o. [name] [name] Primary Care Kayley Zeiders: [name], Alyssa Other Clinician: Referring Shikha Bibb: Treating Imogean Ciampa/Extender: Thana Ates, Alyssa Weeks in Treatment: 38 Active Problems Location of Pain Severity and Description of Pain Patient Has Paino Yes Site Locations Rate the pain. Current Pain Level: 10 Pain Management and Medication Current Pain Management: Electronic Signature(s) Signed: 05/24/2021 3:12:02 PM By: Sandre Kitty Signed: 05/24/2021 5:57:27 PM By: Levan Hurst RN, BSN Entered By: Sandre Kitty on 05/24/2021 09:00:30 -------------------------------------------------------------------------------- Patient/Caregiver Education Details Patient Name: Date of Service: [name] [name] [facility] THA N Carlean Jews 5/24/2023andnbsp9:15 A M Medical Record Number: [id number] Patient Account Number: [id number] Date of Birth/Gender: Treating RN: 01-31-1955 (66 y.o. [name] [name] Primary Care Physician: [name], Alyssa Other Clinician: Referring Physician: Treating Physician/Extender: Orland Jarred in Treatment: 64 Education Assessment Education Provided To: Patient Education Topics Provided Wound/Skin Impairment: Methods: Explain/Verbal Responses: Reinforcements needed, State content correctly Electronic Signature(s) Signed: 05/24/2021 5:55:49 PM By: Adline Peals Entered By: Adline Peals on 05/24/2021 09:11:14 -------------------------------------------------------------------------------- Wound Assessment Details Patient Name: Date of Service: [name] [name] [facility] THA N L. 05/24/2021 9:15 A M Medical Record Number: [id number] Patient Account Number: [id number] Date of Birth/Sex: Treating RN: 1955-11-08 (66 y.o. [name] [name] Primary Care Kerin Cecchi: [name], Alyssa Other Clinician: Referring Moraima Burd: Treating  Docia Klar/Extender: Fredirick Maudlin [name], Alyssa Weeks in Treatment:  45 Wound Status Wound Number: 3 Primary Diabetic Wound/Ulcer of the Lower Extremity Etiology: Wound Location: Right, Lateral Foot Wound Open Wounding Event: Gradually Appeared Status: Date Acquired: 06/21/2020 Comorbid Coronary Artery Disease, Hypertension, Type II Diabetes, Weeks Of Treatment: 45 History: Osteoarthritis, Neuropathy Clustered Wound: No Photos Wound Measurements Length: (cm) 1.1 Width: (cm) 1 Depth: (cm) 0.1 Area: (cm) 0.864 Volume: (cm) 0.086 % Reduction in Area: 56.9% % Reduction in Volume: 78.6% Epithelialization: Medium (34-66%) Tunneling: No Undermining: No Wound Description Classification: Grade 2 Wound Margin: Flat and Intact Exudate Amount: Medium Exudate Type: Serosanguineous Exudate Color: red, brown Foul Odor After Cleansing: No Slough/Fibrino Yes Wound Bed Granulation Amount: Large (67-100%) Exposed Structure Granulation Quality: Pink, Hyper-granulation, Friable Fascia Exposed: No Necrotic Amount: Small (1-33%) Fat Layer (Subcutaneous Tissue) Exposed: Yes Necrotic Quality: Adherent Slough Tendon Exposed: No Muscle Exposed: No Joint Exposed: No Bone Exposed: No Treatment Notes Wound #3 (Foot) Wound Laterality: Right, Lateral Cleanser Soap and Water Discharge Instruction: May shower and wash wound with dial antibacterial soap and water prior to dressing change. Wound Cleanser Discharge Instruction: Cleanse the wound with wound cleanser prior to applying a clean dressing using gauze sponges, not tissue or cotton balls. Peri-Wound Care Topical Primary Dressing Hydrofera Blue Classic Foam, 2x2 in Discharge Instruction: Moisten with saline prior to applying to wound bed Secondary Dressing Bordered Gauze, 4x4 in Discharge Instruction: Apply over primary dressing as directed. Secured With Compression Wrap Compression Stockings Sport and exercise psychologist) Signed: 05/24/2021 5:55:49 PM By: Adline Peals Signed: 05/24/2021 5:57:27 PM By: Levan Hurst RN, BSN Entered By: Adline Peals on 05/24/2021 09:10:42 -------------------------------------------------------------------------------- Vitals Details Patient Name: Date of Service: [name] [name] [facility] THA N L. 05/24/2021 9:15 A M Medical Record Number: [id number] Patient Account Number: [id number] Date of Birth/Sex: Treating RN: [birth year] (66 y.o. [name] [name] Primary Care Perris Conwell: [name], Alyssa Other Clinician: Referring Aubrielle Stroud: Treating Jilliana Burkes/Extender: Fredirick Maudlin [name], Alyssa Weeks in Treatment: 45 Vital Signs Time Taken: 08:59 Temperature (F): 98.9 Height (in): 74 Pulse (bpm): 51 Weight (lbs): 151 Respiratory Rate (breaths/min): 18 Body Mass Index (BMI): 19.4 Blood Pressure (mmHg): 131/69 Capillary Blood Glucose (mg/dl): 280 Reference Range: 80 - 120 mg / dl Electronic Signature(s) Signed: 05/24/2021 3:12:02 PM By: Sandre Kitty Entered By: Sandre Kitty on 05/24/2021 08:59:48

## 2021-05-24 NOTE — Progress Notes (Signed)
DAMONTAE, LOPPNOW ([id number]) Visit Report for 05/24/2021 Chief Complaint Document Details Patient Name: Date of Service: [name] [name] [facility] THA N L. 05/24/2021 9:15 A M Medical Record Number: [id number] Patient Account Number: [id number] Date of Birth/Sex: Treating RN: Feb 10, 1955 (66 y.o. [name] Primary Care Provider: [name], Alyssa Other Clinician: Referring Provider: Treating Provider/Extender: Orland Jarred in Treatment: 90 Information Obtained from: Patient Chief Complaint 07/07/2020; patient is in clinic for today for review of pressure ulcers on his coccyx and left heel and diabetic foot ulcers bilaterally. Electronic Signature(s) Signed: 05/24/2021 9:30:10 AM By: Fredirick Maudlin MD FACS Entered By: Fredirick Maudlin on 05/24/2021 09:30:10 -------------------------------------------------------------------------------- Debridement Details Patient Name: Date of Service: [name] [name] [facility] THA N L. 05/24/2021 9:15 A M Medical Record Number: [id number] Patient Account Number: [id number] Date of Birth/Sex: Treating RN: 1955-01-05 (66 y.o. [name] Primary Care Provider: [name], Alyssa Other Clinician: Referring Provider: Treating Provider/Extender: Orland Jarred in Treatment: 45 Debridement Performed for Assessment: Wound #3 Right,Lateral Foot Performed By: Physician Fredirick Maudlin, MD Debridement Type: Debridement Severity of Tissue Pre Debridement: Fat layer exposed Level of Consciousness (Pre-procedure): Awake and Alert Pre-procedure Verification/Time Out Yes - 09:19 Taken: Start Time: 09:19 Pain Control: Other : benzocaine 20 % T Area Debrided (L x W): otal 1.1 (cm) x 1 (cm) = 1.1 (cm) Tissue and other material debrided: Viable, Non-Viable, Callus, Subcutaneous Level: Skin/Subcutaneous Tissue Debridement Description: Excisional Instrument: Curette Bleeding: Minimum Hemostasis Achieved:  Pressure Procedural Pain: 0 Post Procedural Pain: 0 Response to Treatment: Procedure was tolerated well Level of Consciousness (Post- Awake and Alert procedure): Post Debridement Measurements of Total Wound Length: (cm) 1.1 Width: (cm) 1 Depth: (cm) 0.1 Volume: (cm) 0.086 Character of Wound/Ulcer Post Debridement: Improved Severity of Tissue Post Debridement: Fat layer exposed Post Procedure Diagnosis Same as Pre-procedure Electronic Signature(s) Signed: 05/24/2021 11:45:05 AM By: Fredirick Maudlin MD FACS Signed: 05/24/2021 5:55:49 PM By: Adline Peals Entered By: Adline Peals on 05/24/2021 09:20:37 -------------------------------------------------------------------------------- HPI Details Patient Name: Date of Service: [name] [name] [facility] THA N L. 05/24/2021 9:15 A M Medical Record Number: [id number] Patient Account Number: [id number] Date of Birth/Sex: Treating RN: 02-14-1955 (66 y.o. [name] Primary Care Provider: [name], Alyssa Other Clinician: Referring Provider: Treating Provider/Extender: Thana Ates, Alyssa Weeks in Treatment: 82 History of Present Illness HPI Description: ADMISSION 07/07/2020 This is a 66 year old man who is a type II diabetic. I have reviewed this in epic. It appeared that his diabetes was diagnosed in 2009 at which time he was on metformin. He was followed for a period of time by an endocrinologist with [facility] to always labeled him as a type II diabetic. Recently in epic there is been a switch to type 1 diabetes. If there were defining antibody test done I do not see them and I think the totality of the evidence would suggest that he is a type II diabetic. His recent problem started in May when he fell down 17 stairs. He suffered a scapular fracture he was hospitalized at [facility] for 9 days and then discharged to [facility]. His mother who is present said that most of these wounds seem to develop surrounding the discharge  from hospital or the stay at [facility]. He has an area on his lower sacrum which appears currently to be a stage II but per their description this is actually improved quite a bit. He has an area on the left lateral foot at roughly the base  of the fifth metatarsal, the left Achilles heel and the right fifth metatarsal head laterally they have been using topical antibiotics. Seen by primary care yesterday and placed on Keflex The patient had arterial studies on 05/05/2020. I wondered whether this had something to do with wounds on his feet at that time but I have not been able to determine this. At that time his ABI on the right was 1.09 with a TBI of 0.65 on the left 0.88 with a TBI of 0.44. Waveforms were triphasic on the right monophasic on the left. He likely has significant PAD on the left Past medical history includes type 2 diabetes poorly controlled last hemoglobin A1c at 12.7 in May, hypertension, hyperlipidemia, atrial fib on Eliquis, coronary artery disease status post CABG, left scapular fracture in May of this year, AAA repair. Bilateral leg weakness ABIs were not done in our clinic as they were just done in May of this year at [facility] 7/14; patient arrives back in clinic he has areas on the left Achilles heel, base of the left fifth metatarsal, right lateral fifth metatarsal. The area he had over the coccyx appears to be healed but this area remains vulnerable. We have been using Iodoflex. I rereviewed his ABIs which were done in May. I do not know exactly what prompted these test however they do show a TBI of 0.44 on the left and monophasic waveforms. 7/21; the patient's wounds are about the same. Certainly no healing and the nurses noted some green drainage on the right fifth metatarsal head, left Achilles a little larger. We have been using silver alginate really without any improvement. We did get an appointment with vascular surgery however its that it did the end of  August and they seem to want to repeat the noninvasive test that they have already done in May. I am not sure what the issue here is. We advised the patient's sister to call and point to the idea that they have already had noninvasive studies. The real question I have is does this patient need an angiogram. He is a diabetic and it is possible that the ABIs are falsely elevated because of medial calcification. At least on the left his TBI was abnormal 08/08/2020 upon evaluation today patient appears to be doing about the same in regard to his wounds. He still waiting the appointment with vascular. This is something that hopefully should be undertaken shortly. The 29th of this month is when he is actually scheduled although they have put in a call to move this up if at all possible. Fortunately there does not appear to be any signs of active infection at this time. No fevers, chills, nausea, vomiting, or diarrhea. 8/18; appointment with Dr. Trula Slade on 8/29. The patient has wounds on the left Achilles heel, left lateral foot and the right lateral fifth metatarsal head. We have been using Santyl under Hydrofera Blue. The surface of especially the left heel looks better to me than what I remember. 9/1; since the patient was last here he underwent angiography by Dr. Trula Slade he also underwent noninvasive studies first showing a ABI of of 0.64 with monophasic waveforms. His TBI was 0.35 with a left great toe pressure of 46 this is about the same pattern as we saw previously. The left was a lot worse than the right. He underwent an angiogram on 08/29/2020. He had multiple 80% peroneal artery stenosis which was his dominant vessel which was treated with angioplasty with no residual stenosis.  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 The anterior tibial artery was also occluded. An attempt was made at recanalization of the anterior tibial artery however this was unsuccessful. Noted that the peroneal artery  reconstitutes the posterior tibial artery and the anterior tibial artery creating the plantar arch. The patient has areas on the Achilles left heel left lateral foot and right lateral fifth metatarsal head we have been using Santyl and Hydrofera Blue 9/22; since the patient was last here he went for repeat vascular studies. On the right his ABI was 0.94 with biphasic waveforms TBI of 0.73 with a great toe pressure of 75. On the left which is the problematic wound his ABI was 1.08 but with biphasic waveforms at the PTA. Biphasic waveforms at the dorsalis pedis his TBI was 0.63 with a great toe pressure of 65. On the left however this does represent a fairly marked improvement. We have been using Santyl and Hydrofera Blue 9/29 left Achilles area. Still is having necrotic surface. Bleeds very easily as the patient is on a combination of Eliquis aspirin and Plavix just stopped yesterday. I am not going to do any debridement until next week. We are using Santyl and Hydrofera Blue. His arterial status on the right was actually quite good. The area on the fifth metatarsal head needs additional debridement. A total contact cast in this area is not out of the question. He does describe claudication I believe with minimal activity. He is not very active X-ray we ordered last week is still pending in terms of the report 10/6 left Achilles area. In terms of the necrotic surface this is quite a bit better. There is still some debris in the posterior part of the wound however this looks better. Anteriorly the wound appears to be epithelialized there is still a divot in the middle of this but this does not probe to deep tissue. The area on the right fifth metatarsal head laterally really is not making as much progress as I would have liked. Still not completely a viable surface. Both the patient and his wife agree that he is not an external rotator in terms of ambulation [an outie]. We have been using Santyl and  Hydrofera Blue for a prolonged period. On the left Achilles which looked like it is worse wound this seems to be making some progress not so much on the right foot. The patient and his wife state that they are rigorous about offloading both of these areas He was treated with angioplasty as I believe on the right peroneal artery that had multiple stenosis. This is a dominant vessel the anterior tibial and posterior tibial arteries are occluded however the foot is fed via collaterals from the peroneal. From my notes I am not able to understand the vascular supply on the left I will have to review this. 10/13; left Achilles top part of this looks healthy granulation in the bottom part of this wound again completely necrotic requiring debridement. The area on the right lateral fifth met head also requires debridement. The majority of this is epithelialized however he she there is a refractory part to this. 10/20; left Achilles 100% better looking wound surface. Necrotic upper 50% seems to have resolved at least by inspection under illumination. Culture I did of this area last week postdebridement showed Proteus and Citrobacter. Both sensitive to quinolones and I put him on ciprofloxacin. We have been using silver alginate on the wound on the heel and Santyl and Hydrofera Blue on  the right fifth met head. 10/27; left Achilles is measuring much smaller we are using topical gentamicin under silver alginate. Right fifth met head still fibrinous debris on the surface we are using Santyl and Hydrofera Blue. Patient lost his brother suddenly last week he was unable to keep his vascular surgery appointment. 11/3; left Achilles not as viable surface as I might like. Some hypergranulation. Right fifth metatarsal head still completely nonviable surface. It turns out that she was not using topical gentamicin on the left heel [mother] they have been using Santyl and Hydrofera Blue on the right fifth metatarsal  head. The patient did have noninvasive vascular studies on 10/ 28/22. He had triphasic waveforms up to the level of the proximal PTA and then a 50 to 74% stenosis in the mid PTA. Peroneal artery was triphasic as was the anterior tibial. He follows up with Dr. Trula Slade on 11/7. 11/17; since the patient was last here he saw Dr. Trula Slade and went for ANGIOGRAPHY on 11/15/2020. He has wounds on the left Achilles heel and the right lateral fifth metatarsal head. He has known PAD and had previous revascularization in August. On this occasion on the right his right common femoral, profundofemoral and superficial femoral and popliteal arteries were widely patent the dominant vessel is the posterior tibial which had several areas of stenosis greater than 80% he went underwent angioplasty in this area. He had no interventions on the left however he is left common femoral, profundofemoral and superficial femoral arteries were widely patent as well as the popliteal he has recurrent stenosis greater than 50% within the peroneal artery which is a single- vessel runoff. The thought was that he may require further angiography and reangioplasty on the left peroneal artery. Overall the patient has severe PAD he has undergone angioplasty of the right posterior tibial artery. He arrives in clinic today with the Achilles heel area on the left surprisingly better with marked improvement in the surface area on the right not nearly as good but stable 12/8; patient has follow-up arterial Doppler studies on 12/[facility] at the [facility]. He is now being revascularized on both sides. Fortunately on the right Achilles area this is just about closed slight scabbing/eschar. I did not remove this today. The area on the right fifth met head looked better after last week's debridement we have been using Santyl and Hydrofera Blue 01/05/21 the patient has not been here in almost a month. He was supposed to have follow-up Doppler studies on  12/19 look these up. He has been revascularized on both sides The left Achilles area is closed he does not need to dress this but will need to protect this area on an ongoing basis likely indefinitely. He still has the area on the right fifth plantar metatarsal head. The area is measuring smaller he has a rim of epithelialization. The granulation tissue still a bit gelatinous for my liking 1/26; The left Achilles remains closed. Plantar right fifth metatarsal head is smaller and appears to be healthy. He has not followed up with vascular surgery and does not seem particularly interested. He did not get his follow-up arterial Dopplers. I explained to him that angioplasty of the small arteries in his leg is a limb saving procedure but possibility of restenosis is reasonably high and he should follow-up with follow-up noninvasive studies. Frankly he did not seem interested today 03/30/2021: The patient has not been seen here since the end of January. He never followed up with vascular surgery and  today states that he refuses to be seen at [facility] due to his dissatisfaction with the fact that his arteries seem to restenose. The wound on his left Achilles is still closed. The plantar right fifth metatarsal head is quite a bit bigger than it was at his last visit. He is accompanied by his mother who reports that it had gotten down to a very small size but then she went out of town for a week and during that time it enlarged to its current dimensions. The patient is wearing the functional equivalent of house slippers for shoes and is not making any effort to offload. He refuses to wear a forefoot offloading sandal due to instability on his feet and says that when a total contact cast was attempted, it was "a total disaster" and he is unwilling to consider this again. He has been in PolyMem silver since at least the beginning of January. 04/26/2021: The patient is here today after about a month. He has been using  Hydrofera Blue to his wound and today it is markedly improved. It is small and clean. He is scheduled to see Dr. Gwenlyn Found in interventional cardiology tomorrow. 05/24/2021: Once again, the patient returns after about a month. I had referred him to Dr. Alvester Chou and the patient walked out from that appointment. Apparently there was some confusion over why he was seeing the cardiologist for his peripheral arterial disease. He saw his primary cardiologist, Dr. Davina Poke, last week and has been re-referred to see Dr. Gwenlyn Found, the appointment is tomorrow. Basically the wound is a little bit bigger today but clean without any slough accumulation. The periwound callus that has accumulated is completely macerated. I thought he had been in [facility], but the dressing that had been removed and discarded today looks more like PolyMem Ag. Electronic Signature(s) Signed: 05/24/2021 9:31:53 AM By: Fredirick Maudlin MD FACS Entered By: Fredirick Maudlin on 05/24/2021 09:31:53 -------------------------------------------------------------------------------- Physical Exam Details Patient Name: Date of Service: [name] [name] [facility] THA N L. 05/24/2021 9:15 A M Medical Record Number: [id number] Patient Account Number: [id number] Date of Birth/Sex: Treating RN: Sep 24, 1955 (66 y.o. [name] Primary Care Provider: [name], Alyssa Other Clinician: Referring Provider: Treating Provider/Extender: Fredirick Maudlin [name], Alyssa Weeks in Treatment: 45 Constitutional . Bradycardic, asymptomatic.. . . No acute distress. Respiratory Normal work of breathing on room air. Notes 05/24/2021: The wound on the right fifth metatarsal head is larger today. It is clean with some granulation tissue, but the periwound callus that has accumulated is completely macerated. Electronic Signature(s) Signed: 05/24/2021 9:33:34 AM By: Fredirick Maudlin MD FACS Entered By: Fredirick Maudlin on 05/24/2021  09:33:34 -------------------------------------------------------------------------------- Physician Orders Details Patient Name: Date of Service: [name] [name] [facility] THA N L. 05/24/2021 9:15 A M Medical Record Number: [id number] Patient Account Number: [id number] Date of Birth/Sex: Treating RN: 09/16/1955 (66 y.o. [name] Primary Care Provider: [name], Alyssa Other Clinician: Referring Provider: Treating Provider/Extender: Orland Jarred in Treatment: 26 Verbal / Phone Orders: No Diagnosis Coding ICD-10 Coding Code Description L97.511 Non-pressure chronic ulcer of other part of right foot limited to breakdown of skin E11.621 Type 2 diabetes mellitus with foot ulcer E11.51 Type 2 diabetes mellitus with diabetic peripheral angiopathy without gangrene E11.42 Type 2 diabetes mellitus with diabetic polyneuropathy Follow-up Appointments ppointment in 1 week. - Dr. Celine Ahr - Room 2 - Return A Bathing/ Shower/ Hygiene May shower and wash wound with soap and water. - with dressing changes only. Edema Control - Lymphedema /  SCD / Other Elevate legs to the level of the heart or above for 30 minutes daily and/or when sitting, a frequency of: - 3-4 times a day throughout the day. Moisturize legs daily. - every night before bed. *** closely monitor feet every night before bed. Off-Loading Other: - float heels while resting in bed or chair. Use a pillow or rolled sheet under legs to offload the heels. Ensure no pressure to right lateral foot. Home Health New wound care orders this week; continue Home Health for wound care. May utilize formulary equivalent dressing for wound treatment orders unless otherwise specified. Other Home Health Orders/Instructions: - [facility] home health weekly changes and all other days family to change. Wound Treatment Wound #3 - Foot Wound Laterality: Right, Lateral Cleanser: Soap and Water Every Other Day/30 Days Discharge  Instructions: May shower and wash wound with dial antibacterial soap and water prior to dressing change. Cleanser: Wound Cleanser Every Other Day/30 Days Discharge Instructions: Cleanse the wound with wound cleanser prior to applying a clean dressing using gauze sponges, not tissue or cotton balls. Cleanser: [employer]ram Ancillary Kit - 15 Day Supply (DME) (Generic) Every Other Day/30 Days Discharge Instructions: Use supplies as instructed; Kit contains: (15) Saline Bullets; (15) 3x3 Gauze; 15 pr Gloves Prim Dressing: Hydrofera Blue Classic Foam, 2x2 in (DME) (Generic) Every Other Day/30 Days ary Discharge Instructions: Moisten with saline prior to applying to wound bed Secondary Dressing: Bordered Gauze, 4x4 in (DME) (Generic) Every Other Day/30 Days Discharge Instructions: Apply over primary dressing as directed. Electronic Signature(s) Signed: 05/24/2021 11:45:05 AM By: Fredirick Maudlin MD FACS Signed: 05/24/2021 5:55:49 PM By: Sabas Sous By: Adline Peals on 05/24/2021 09:51:15 -------------------------------------------------------------------------------- Problem List Details Patient Name: Date of Service: [name] [name] [facility] THA N L. 05/24/2021 9:15 A M Medical Record Number: [id number] Patient Account Number: [id number] Date of Birth/Sex: Treating RN: October 09, 1955 (66 y.o. [name] Primary Care Provider: [name], Alyssa Other Clinician: Referring Provider: Treating Provider/Extender: Orland Jarred in Treatment: 52 Active Problems ICD-10 Encounter Code Description Active Date MDM Diagnosis L97.511 Non-pressure chronic ulcer of other part of right foot limited to breakdown of 07/07/2020 No Yes skin E11.621 Type 2 diabetes mellitus with foot ulcer 07/07/2020 No Yes E11.51 Type 2 diabetes mellitus with diabetic peripheral angiopathy without gangrene 09/01/2020 No Yes E11.42 Type 2 diabetes mellitus with diabetic polyneuropathy 07/07/2020 No  Yes Inactive Problems ICD-10 Code Description Active Date Inactive Date L97.528 Non-pressure chronic ulcer of other part of left foot with other specified severity 07/07/2020 07/07/2020 L89.152 Pressure ulcer of sacral region, stage 2 07/07/2020 07/07/2020 D42.876 Pressure ulcer of left heel, stage 2 07/07/2020 07/07/2020 Resolved Problems Electronic Signature(s) Signed: 05/24/2021 9:28:47 AM By: Fredirick Maudlin MD FACS Entered By: Fredirick Maudlin on 05/24/2021 09:28:47 -------------------------------------------------------------------------------- Progress Note Details Patient Name: Date of Service: [name] [name] [facility] THA N L. 05/24/2021 9:15 A M Medical Record Number: [id number] Patient Account Number: [id number] Date of Birth/Sex: Treating RN: March 24, 1955 (66 y.o. [name] Primary Care Provider: [name], Alyssa Other Clinician: Referring Provider: Treating Provider/Extender: Orland Jarred in Treatment: 90 Subjective Chief Complaint Information obtained from Patient 07/07/2020; patient is in clinic for today for review of pressure ulcers on his coccyx and left heel and diabetic foot ulcers bilaterally. History of Present Illness (HPI) ADMISSION 07/07/2020 This is a 66 year old man who is a type II diabetic. I have reviewed this in epic. It appeared that his diabetes was diagnosed in 2009 at which time he  was on metformin. He was followed for a period of time by an endocrinologist with [facility] to always labeled him as a type II diabetic. Recently in epic there is been a switch to type 1 diabetes. If there were defining antibody test done I do not see them and I think the totality of the evidence would suggest that he is a type II diabetic. His recent problem started in May when he fell down 17 stairs. He suffered a scapular fracture he was hospitalized at [facility] for 9 days and then discharged to [facility]. His mother who is present said that most of these wounds seem  to develop surrounding the discharge from hospital or the stay at [facility]. He has an area on his lower sacrum which appears currently to be a stage II but per their description this is actually improved quite a bit. He has an area on the left lateral foot at roughly the base of the fifth metatarsal, the left Achilles heel and the right fifth metatarsal head laterally they have been using topical antibiotics. Seen by primary care yesterday and placed on Keflex The patient had arterial studies on 05/05/2020. I wondered whether this had something to do with wounds on his feet at that time but I have not been able to determine this. At that time his ABI on the right was 1.09 with a TBI of 0.65 on the left 0.88 with a TBI of 0.44. Waveforms were triphasic on the right monophasic on the left. He likely has significant PAD on the left Past medical history includes type 2 diabetes poorly controlled last hemoglobin A1c at 12.7 in May, hypertension, hyperlipidemia, atrial fib on Eliquis, coronary artery disease status post CABG, left scapular fracture in May of this year, AAA repair. Bilateral leg weakness ABIs were not done in our clinic as they were just done in May [facility] this [facility] 7/14; patient arrives back in clinic he has areas on the left Achilles heel, base of the left fifth metatarsal, right lateral fifth metatarsal. The area he had over the coccyx appears to be healed but this area remains vulnerable. We have been using Iodoflex. I rereviewed his ABIs which were done in May. I do not know exactly what prompted these test however they do show a TBI of 0.44 on the left and monophasic waveforms. 7/21; the patient's wounds are about the same. Certainly no healing and the nurses noted some green drainage on the right fifth metatarsal head, left Achilles a little larger. We have been using silver alginate really without any improvement. We did get an appointment with vascular surgery  however its that it did the end of August and they seem to want to repeat the noninvasive test that they have already done in May. I am not sure what the issue here is. We advised the patient's sister to call and point to the idea that they have already had noninvasive studies. The real question I have is does this patient need an angiogram. He is a diabetic and it is possible that the ABIs are falsely elevated because of medial calcification. At least on the left his TBI was abnormal 08/08/2020 upon evaluation today patient appears to be doing about the same in regard to his wounds. He still waiting the appointment with vascular. This is something that hopefully should be undertaken shortly. The 29th of this month is when he is actually scheduled although they have put in a call to move  this up if at all possible. Fortunately there does not appear to be any signs of active infection at this time. No fevers, chills, nausea, vomiting, or diarrhea. 8/18; appointment with Dr. Trula Slade on 8/29. The patient has wounds on the left Achilles heel, left lateral foot and the right lateral fifth metatarsal head. We have been using Santyl under Hydrofera Blue. The surface of especially the left heel looks better to me than what I remember. 9/1; since the patient was last here he underwent angiography by Dr. Trula Slade he also underwent noninvasive studies first showing a ABI of of 0.64 with monophasic waveforms. His TBI was 0.35 with a left great toe pressure of 46 this is about the same pattern as we saw previously. The left was a lot worse than the right. He underwent an angiogram on 08/29/2020. He had multiple 80% peroneal artery stenosis which was his dominant vessel which was treated with angioplasty with no residual stenosis. 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 The anterior tibial artery was also occluded. An attempt was made at recanalization of the anterior tibial artery however this was unsuccessful.  Noted that the peroneal artery reconstitutes the posterior tibial artery and the anterior tibial artery creating the plantar arch. The patient has areas on the Achilles left heel left lateral foot and right lateral fifth metatarsal head we have been using Santyl and Hydrofera Blue 9/22; since the patient was last here he went for repeat vascular studies. On the right his ABI was 0.94 with biphasic waveforms TBI of 0.73 with a great toe pressure of 75. On the left which is the problematic wound his ABI was 1.08 but with biphasic waveforms at the PTA. Biphasic waveforms at the dorsalis pedis his TBI was 0.63 with a great toe pressure of 65. On the left however this does represent a fairly marked improvement. We have been using Santyl and Hydrofera Blue 9/29 left Achilles area. Still is having necrotic surface. Bleeds very easily as the patient is on a combination of Eliquis aspirin and Plavix just stopped yesterday. I am not going to do any debridement until next week. We are using Santyl and Hydrofera Blue. His arterial status on the right was actually quite good. The area on the fifth metatarsal head needs additional debridement. A total contact cast in this area is not out of the question. He does describe claudication I believe with minimal activity. He is not very active X-ray we ordered last week is still pending in terms of the report 10/6 left Achilles area. In terms of the necrotic surface this is quite a bit better. There is still some debris in the posterior part of the wound however this looks better. Anteriorly the wound appears to be epithelialized there is still a divot in the middle of this but this does not probe to deep tissue. The area on the right fifth metatarsal head laterally really is not making as much progress as I would have liked. Still not completely a viable surface. Both the patient and his wife agree that he is not an external rotator in terms of ambulation [an outie]. We  have been using Santyl and Hydrofera Blue for a prolonged period. On the left Achilles which looked like it is worse wound this seems to be making some progress not so much on the right foot. The patient and his wife state that they are rigorous about offloading both of these areas He was treated with angioplasty as I believe on the right  peroneal artery that had multiple stenosis. This is a dominant vessel the anterior tibial and posterior tibial arteries are occluded however the foot is fed via collaterals from the peroneal. From my notes I am not able to understand the vascular supply on the left I will have to review this. 10/13; left Achilles top part of this looks healthy granulation in the bottom part of this wound again completely necrotic requiring debridement. oo The area on the right lateral fifth met head also requires debridement. The majority of this is epithelialized however he she there is a refractory part to this. 10/20; left Achilles 100% better looking wound surface. Necrotic upper 50% seems to have resolved at least by inspection under illumination. Culture I did of this area last week postdebridement showed Proteus and Citrobacter. Both sensitive to quinolones and I put him on ciprofloxacin. We have been using silver alginate on the wound on the heel and Santyl and Hydrofera Blue on the right fifth met head. 10/27; left Achilles is measuring much smaller we are using topical gentamicin under silver alginate. Right fifth met head still fibrinous debris on the surface we are using Santyl and Hydrofera Blue. Patient lost his brother suddenly last week he was unable to keep his vascular surgery appointment. 11/3; left Achilles not as viable surface as I might like. Some hypergranulation. Right fifth metatarsal head still completely nonviable surface. It turns out that she was not using topical gentamicin on the left heel [mother] they have been using Santyl and Hydrofera Blue on the  right fifth metatarsal head. The patient did have noninvasive vascular studies on 10/ 28/22. He had triphasic waveforms up to the level of the proximal PTA and then a 50 to 74% stenosis in the mid PTA. Peroneal artery was triphasic as was the anterior tibial. He follows up with Dr. Trula Slade on 11/7. 11/17; since the patient was last here he saw Dr. Trula Slade and went for ANGIOGRAPHY on 11/15/2020. He has wounds on the left Achilles heel and the right lateral fifth metatarsal head. He has known PAD and had previous revascularization in August. On this occasion on the right his right common femoral, profundofemoral and superficial femoral and popliteal arteries were widely patent the dominant vessel is the posterior tibial which had several areas of stenosis greater than 80% he went underwent angioplasty in this area. He had no interventions on the left however he is left common femoral, profundofemoral and superficial femoral arteries were widely patent as well as the popliteal he has recurrent stenosis greater than 50% within the peroneal artery which is a single- vessel runoff. The thought was that he may require further angiography and reangioplasty on the left peroneal artery. Overall the patient has severe PAD he has undergone angioplasty of the right posterior tibial artery. He arrives in clinic today with the Achilles heel area on the left surprisingly better with marked improvement in the surface area on the right not nearly as good but stable 12/8; patient has follow-up arterial Doppler studies on 12[facility] He is now being revascularized on both sides. Fortunately on the right Achilles area this is just about closed slight scabbing/eschar. I did not remove this today. The area on the right fifth met head looked better after last week's debridement we have been using Santyl and Hydrofera Blue 01/05/21 the patient has not been here in almost a month. He was supposed to have  follow-up Doppler studies on 12/19 look these up. He has been  revascularized on both sides The left Achilles area is closed he does not need to dress this but will need to protect this area on an ongoing basis likely indefinitely. He still has the area on the right fifth plantar metatarsal head. The area is measuring smaller he has a rim of epithelialization. The granulation tissue still a bit gelatinous for my liking 1/26; The left Achilles remains closed. Plantar right fifth metatarsal head is smaller and appears to be healthy. He has not followed up with vascular surgery and does not seem particularly interested. He did not get his follow-up arterial Dopplers. I explained to him that angioplasty of the small arteries in his leg is a limb saving procedure but possibility of restenosis is reasonably high and he should follow-up with follow-up noninvasive studies. Frankly he did not seem interested today 03/30/2021: The patient has not been seen here since the end of January. He never followed up with vascular surgery and today states that he refuses to be seen at [facility] due to his dissatisfaction with the fact that his arteries seem to restenose. The wound on his left Achilles is still closed. The plantar right fifth metatarsal head is quite a bit bigger than it was at his last visit. He is accompanied by his mother who reports that it had gotten down to a very small size but then she went out of town for a week and during that time it enlarged to its current dimensions. The patient is wearing the functional equivalent of house slippers for shoes and is not making any effort to offload. He refuses to wear a forefoot offloading sandal due to instability on his feet and says that when a total contact cast was attempted, it was "a total disaster" and he is unwilling to consider this again. He has been in PolyMem silver since at least the beginning of January. 04/26/2021: The patient is here today after about  a month. He has been using Hydrofera Blue to his wound and today it is markedly improved. It is small and clean. He is scheduled to see Dr. Gwenlyn Found in interventional cardiology tomorrow. 05/24/2021: Once again, the patient returns after about a month. I had referred him to Dr. Alvester Chou and the patient walked out from that appointment. Apparently there was some confusion over why he was seeing the cardiologist for his peripheral arterial disease. He saw his primary cardiologist, Dr. Davina Poke, last week and has been re-referred to see Dr. Gwenlyn Found, the appointment is tomorrow. Basically the wound is a little bit bigger today but clean without any slough accumulation. The periwound callus that has accumulated is completely macerated. I thought he had been in [facility], but the dressing that had been removed and discarded today looks more like PolyMem Ag. Patient History Information obtained from Patient. Family History Cancer - Paternal Grandparents,Father, Heart Disease - Father, Hypertension - Father, No family history of Diabetes, Hereditary Spherocytosis, Kidney Disease, Lung Disease, Seizures, Stroke, Thyroid Problems, Tuberculosis. Social History Current some day smoker - Landscape architect, Marital Status - Divorced, Alcohol Use - Rarely, Drug Use - No History, Caffeine Use - Daily. Medical History Cardiovascular Patient has history of Coronary Artery Disease, Hypertension Endocrine Patient has history of Type II Diabetes Musculoskeletal Patient has history of Osteoarthritis Neurologic Patient has history of Neuropathy Objective Constitutional Bradycardic, asymptomatic.Marland Kitchen No acute distress. Vitals Time Taken: 8:59 AM, Height: 74 in, Weight: 151 lbs, BMI: 19.4, Temperature: 98.9 F, Pulse: 51 bpm, Respiratory Rate: 18 breaths/min, Blood Pressure: 131/69 mmHg, Capillary  Blood Glucose: 280 mg/dl. Respiratory Normal work of breathing on room air. General Notes: 05/24/2021: The wound on the right fifth  metatarsal head is larger today. It is clean with some granulation tissue, but the periwound callus that has accumulated is completely macerated. Integumentary (Hair, Skin) Wound #3 status is Open. Original cause of wound was Gradually Appeared. The date acquired was: 06/21/2020. The wound has been in treatment 45 weeks. The wound is located on the Right,Lateral Foot. The wound measures 1.1cm length x 1cm width x 0.1cm depth; 0.864cm^2 area and 0.086cm^3 volume. There is Fat Layer (Subcutaneous Tissue) exposed. There is no tunneling or undermining noted. There is a medium amount of serosanguineous drainage noted. The wound margin is flat and intact. There is large (67-100%) pink, friable, hyper - granulation within the wound bed. There is a small (1-33%) amount of necrotic tissue within the wound bed including Adherent Slough. Assessment Active Problems ICD-10 Non-pressure chronic ulcer of other part of right foot limited to breakdown of skin Type 2 diabetes mellitus with foot ulcer Type 2 diabetes mellitus with diabetic peripheral angiopathy without gangrene Type 2 diabetes mellitus with diabetic polyneuropathy Procedures Wound #3 Pre-procedure diagnosis of Wound #3 is a Diabetic Wound/Ulcer of the Lower Extremity located on the Right,Lateral Foot .Severity of Tissue Pre Debridement is: Fat layer exposed. There was a Excisional Skin/Subcutaneous Tissue Debridement with a total area of 1.1 sq cm performed by Fredirick Maudlin, MD. With the following instrument(s): Curette to remove Viable and Non-Viable tissue/material. Material removed includes Callus and Subcutaneous Tissue and after achieving pain control using Other (benzocaine 20 %). No specimens were taken. A time out was conducted at 09:19, prior to the start of the procedure. A Minimum amount of bleeding was controlled with Pressure. The procedure was tolerated well with a pain level of 0 throughout and a pain level of 0 following  the procedure. Post Debridement Measurements: 1.1cm length x 1cm width x 0.1cm depth; 0.086cm^3 volume. Character of Wound/Ulcer Post Debridement is improved. Severity of Tissue Post Debridement is: Fat layer exposed. Post procedure Diagnosis Wound #3: Same as Pre-Procedure Plan 05/24/2021: The wound on the right fifth metatarsal head is larger today. It is clean with some granulation tissue, but the periwound callus that has accumulated is completely macerated. I used a curette to debride the macerated tissue and pare back the callus as well as remove biofilm from the surface. The patient's mother had many questions today as to why he is not healing. I discussed the nature of atherosclerotic vascular disease and that even though he has had a number of procedures on his legs, it is not uncommon for the disease process to cause reocclusion's or stenoses of the vessels that were intervened upon. He also continues to have poor diabetic control with his last hemoglobin A1c being 9.2. I let them know that this was also a contributing factor to why he is not making better progress with his wounds. He is not wearing appropriate diabetic shoes, potentially subjecting the wound to further trauma. He also only comes in once a month, which is yet another factor that may be contributing to his lack of progress. We will use Hydrofera Blue and a foam bordered dressing to the wound as he is unwilling to consider the possibility of an offloading shoe or a total contact cast. He was encouraged to keep his appointment with interventional cardiology tomorrow. I was able to convince them that he needed to come in on a weekly basis so  that intervention can be swift, if things deteriorate. I will see him in 1 week's time. Electronic Signature(s) Signed: 05/24/2021 9:36:43 AM By: Fredirick Maudlin MD FACS Entered By: Fredirick Maudlin on 05/24/2021  09:36:42 -------------------------------------------------------------------------------- HxROS Details Patient Name: Date of Service: [name] [name] HNA THA N L. 05/24/2021 9:15 A M Medical Record Number: [id number] Patient Account Number: [id number] Date of Birth/Sex: Treating RN: 11-17-1955 (66 y.o. [name] Primary Care Provider: [name], Alyssa Other Clinician: Referring Provider: Treating Provider/Extender: Thana Ates, Alyssa Weeks in Treatment: 2 Information Obtained From Patient Cardiovascular Medical History: Positive for: Coronary Artery Disease; Hypertension Endocrine Medical History: Positive for: Type II Diabetes Treated with: Insulin Blood sugar tested every day: Yes Tested : CGM Musculoskeletal Medical History: Positive for: Osteoarthritis Neurologic Medical History: Positive for: Neuropathy Immunizations Pneumococcal Vaccine: Received Pneumococcal Vaccination: Yes Received Pneumococcal Vaccination On or After 60th Birthday: Yes Implantable Devices None Family and Social History Cancer: Yes - Paternal Grandparents,Father; Diabetes: No; Heart Disease: Yes - Father; Hereditary Spherocytosis: No; Hypertension: Yes - Father; Kidney Disease: No; Lung Disease: No; Seizures: No; Stroke: No; Thyroid Problems: No; Tuberculosis: No; Current some day smoker - Cigars; Marital Status - Divorced; Alcohol Use: Rarely; Drug Use: No History; Caffeine Use: Daily; Financial Concerns: No; Food, Clothing or Shelter Needs: No; Support System Lacking: No; Transportation Concerns: No Electronic Signature(s) Signed: 05/24/2021 11:45:05 AM By: Fredirick Maudlin MD FACS Signed: 05/24/2021 5:57:27 PM By: Levan Hurst RN, BSN Entered By: Fredirick Maudlin on 05/24/2021 09:32:21 -------------------------------------------------------------------------------- SuperBill Details Patient Name: Date of Service: [name] [name] HNA THA N L. 05/24/2021 Medical Record Number:  [id number] Patient Account Number: [id number] Date of Birth/Sex: Treating RN: June 04, 1955 (66 y.o. [name] Primary Care Provider: [name], Alyssa Other Clinician: Referring Provider: Treating Provider/Extender: Thana Ates, Alyssa Weeks in Treatment: 45 Diagnosis Coding ICD-10 Codes Code Description L97.511 Non-pressure chronic ulcer of other part of right foot limited to breakdown of skin E11.621 Type 2 diabetes mellitus with foot ulcer E11.51 Type 2 diabetes mellitus with diabetic peripheral angiopathy without gangrene E11.42 Type 2 diabetes mellitus with diabetic polyneuropathy Facility Procedures CPT4 Code: [id number] Description: 66440 - DEB SUBQ TISSUE 20 SQ CM/< ICD-10 Diagnosis Description L97.511 Non-pressure chronic ulcer of other part of right foot limited to breakdown of Modifier: skin Quantity: 1 Physician Procedures : CPT4 Code Description Modifier [id number] - WC PHYS LEVEL 4 - EST PT 25 ICD-10 Diagnosis Description L97.511 Non-pressure chronic ulcer of other part of right foot limited to breakdown of skin E11.621 Type 2 diabetes mellitus with foot ulcer E11.42  Type 2 diabetes mellitus with diabetic polyneuropathy E11.51 Type 2 diabetes mellitus with diabetic peripheral angiopathy without gangrene Quantity: 1 : [id number] 11042 - WC PHYS SUBQ TISS 20 SQ CM ICD-10 Diagnosis Description L97.511 Non-pressure chronic ulcer of other part of right foot limited to breakdown of skin Quantity: 1 Electronic Signature(s) Signed: 05/24/2021 9:37:00 AM By: Fredirick Maudlin MD FACS Entered By: Fredirick Maudlin on 05/24/2021 09:36:59

## 2021-05-25 DIAGNOSIS — L89150 Pressure ulcer of sacral region, unstageable: Secondary | ICD-10-CM | POA: Diagnosis not present

## 2021-05-25 DIAGNOSIS — L97511 Non-pressure chronic ulcer of other part of right foot limited to breakdown of skin: Secondary | ICD-10-CM | POA: Diagnosis not present

## 2021-05-25 DIAGNOSIS — S42102D Fracture of unspecified part of scapula, left shoulder, subsequent encounter for fracture with routine healing: Secondary | ICD-10-CM | POA: Diagnosis not present

## 2021-05-31 ENCOUNTER — Encounter (HOSPITAL_BASED_OUTPATIENT_CLINIC_OR_DEPARTMENT_OTHER): Payer: HMO | Admitting: General Surgery

## 2021-05-31 ENCOUNTER — Other Ambulatory Visit: Payer: Self-pay

## 2021-05-31 DIAGNOSIS — L97512 Non-pressure chronic ulcer of other part of right foot with fat layer exposed: Secondary | ICD-10-CM | POA: Diagnosis not present

## 2021-05-31 DIAGNOSIS — L97511 Non-pressure chronic ulcer of other part of right foot limited to breakdown of skin: Secondary | ICD-10-CM | POA: Diagnosis not present

## 2021-05-31 DIAGNOSIS — E11621 Type 2 diabetes mellitus with foot ulcer: Secondary | ICD-10-CM | POA: Diagnosis not present

## 2021-05-31 NOTE — Progress Notes (Signed)
POSEIDON, PAM ([id number]) Visit Report for 05/31/2021 Chief Complaint Document Details Patient Name: Date of Service: [name] [name] [facility] N L. 05/31/2021 8:30 A M Medical Record Number: [id number] Patient Account Number: [id number] Date of Birth/Sex: Treating RN: 11/10/1955 (66 y.o. M) Primary Care Provider: [name], Alyssa Other Clinician: Referring Provider: Treating Provider/Extender: Orland Jarred in Treatment: 61 Information Obtained from: Patient Chief Complaint 07/07/2020; patient is in clinic for today for review of pressure ulcers on his coccyx and left heel and diabetic foot ulcers bilaterally. Electronic Signature(s) Signed: 05/31/2021 9:12:03 AM By: Fredirick Maudlin MD FACS Entered By: Fredirick Maudlin on 05/31/2021 09:12:02 -------------------------------------------------------------------------------- HPI Details Patient Name: Date of Service: [name] [name] [facility] THA N L. 05/31/2021 8:30 A M Medical Record Number: [id number] Patient Account Number: [id number] Date of Birth/Sex: Treating RN: 05/29/1955 (66 y.o. M) Primary Care Provider: [name], Alyssa Other Clinician: Referring Provider: Treating Provider/Extender: Orland Jarred in Treatment: 50 History of Present Illness HPI Description: ADMISSION 07/07/2020 This is a 66 year old man who is a type II diabetic. I have reviewed this in epic. It appeared that his diabetes was diagnosed in 2009 at which time he was on metformin. He was followed for a period of time by an endocrinologist with [facility] to always labeled him as a type II diabetic. Recently in epic there is been a switch to type 1 diabetes. If there were defining antibody test done I do not see them and I think the totality of the evidence would suggest that he is a type II diabetic. His recent problem started in May when he fell down 17 stairs. He suffered a scapular fracture he was hospitalized at [facility] for 9  days and then discharged to [facility]. His mother who is present said that most of these wounds seem to develop surrounding the discharge from hospital or the stay at [facility]. He has an area on his lower sacrum which appears currently to be a stage II but per their description this is actually improved quite a bit. He has an area on the left lateral foot at roughly the base of the fifth metatarsal, the left Achilles heel and the right fifth metatarsal head laterally they have been using topical antibiotics. Seen by primary care yesterday and placed on Keflex The patient had arterial studies on 05/05/2020. I wondered whether this had something to do with wounds on his feet at that time but I have not been able to determine this. At that time his ABI on the right was 1.09 with a TBI of 0.65 on the left 0.88 with a TBI of 0.44. Waveforms were triphasic on the right monophasic on the left. He likely has significant PAD on the left Past medical history includes type 2 diabetes poorly controlled last hemoglobin A1c at 12.7 in May, hypertension, hyperlipidemia, atrial fib on Eliquis, coronary artery disease status post CABG, left scapular fracture in May of this year, AAA repair. Bilateral leg weakness ABIs were not done in our clinic as they were just done in [facility]; patient arrives back in clinic he has areas on the left Achilles heel, base of the left fifth metatarsal, right lateral fifth metatarsal. The area he had over the coccyx appears to be healed but this area remains vulnerable. We have been using Iodoflex. I rereviewed his ABIs which were done in May. I do not know exactly what prompted these test however they do show  a TBI of 0.44 on the left and monophasic waveforms. 7/21; the patient's wounds are about the same. Certainly no healing and the nurses noted some green drainage on the right fifth metatarsal head, left Achilles a little larger. We have been  using silver alginate really without any improvement. We did get an appointment with vascular surgery however its that it did the end of August and they seem to want to repeat the noninvasive test that they have already done in May. I am not sure what the issue here is. We advised the patient's sister to call and point to the idea that they have already had noninvasive studies. The real question I have is does this patient need an angiogram. He is a diabetic and it is possible that the ABIs are falsely elevated because of medial calcification. At least on the left his TBI was abnormal 08/08/2020 upon evaluation today patient appears to be doing about the same in regard to his wounds. He still waiting the appointment with vascular. This is something that hopefully should be undertaken shortly. The 29th of this month is when he is actually scheduled although they have put in a call to move this up if at all possible. Fortunately there does not appear to be any signs of active infection at this time. No fevers, chills, nausea, vomiting, or diarrhea. 8/18; appointment with Dr. Trula Slade on 8/29. The patient has wounds on the left Achilles heel, left lateral foot and the right lateral fifth metatarsal head. We have been using Santyl under Hydrofera Blue. The surface of especially the left heel looks better to me than what I remember. 9/1; since the patient was last here he underwent angiography by Dr. Trula Slade he also underwent noninvasive studies first showing a ABI of of 0.64 with monophasic waveforms. His TBI was 0.35 with a left great toe pressure of 46 this is about the same pattern as we saw previously. The left was a lot worse than the right. He underwent an angiogram on 08/29/2020. He had multiple 80% peroneal artery stenosis which was his dominant vessel which was treated with angioplasty with no residual stenosis. 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 The anterior tibial artery was also  occluded. An attempt was made at recanalization of the anterior tibial artery however this was unsuccessful. Noted that the peroneal artery reconstitutes the posterior tibial artery and the anterior tibial artery creating the plantar arch. The patient has areas on the Achilles left heel left lateral foot and right lateral fifth metatarsal head we have been using Santyl and Hydrofera Blue 9/22; since the patient was last here he went for repeat vascular studies. On the right his ABI was 0.94 with biphasic waveforms TBI of 0.73 with a great toe pressure of 75. On the left which is the problematic wound his ABI was 1.08 but with biphasic waveforms at the PTA. Biphasic waveforms at the dorsalis pedis his TBI was 0.63 with a great toe pressure of 65. On the left however this does represent a fairly marked improvement. We have been using Santyl and Hydrofera Blue 9/29 left Achilles area. Still is having necrotic surface. Bleeds very easily as the patient is on a combination of Eliquis aspirin and Plavix just stopped yesterday. I am not going to do any debridement until next week. We are using Santyl and Hydrofera Blue. His arterial status on the right was actually quite good. The area on the fifth metatarsal head needs additional debridement. A total contact cast in  this area is not out of the question. He does describe claudication I believe with minimal activity. He is not very active X-ray we ordered last week is still pending in terms of the report 10/6 left Achilles area. In terms of the necrotic surface this is quite a bit better. There is still some debris in the posterior part of the wound however this looks better. Anteriorly the wound appears to be epithelialized there is still a divot in the middle of this but this does not probe to deep tissue. The area on the right fifth metatarsal head laterally really is not making as much progress as I would have liked. Still not completely a viable surface.  Both the patient and his wife agree that he is not an external rotator in terms of ambulation [an outie]. We have been using Santyl and Hydrofera Blue for a prolonged period. On the left Achilles which looked like it is worse wound this seems to be making some progress not so much on the right foot. The patient and his wife state that they are rigorous about offloading both of these areas He was treated with angioplasty as I believe on the right peroneal artery that had multiple stenosis. This is a dominant vessel the anterior tibial and posterior tibial arteries are occluded however the foot is fed via collaterals from the peroneal. From my notes I am not able to understand the vascular supply on the left I will have to review this. 10/13; left Achilles top part of this looks healthy granulation in the bottom part of this wound again completely necrotic requiring debridement. The area on the right lateral fifth met head also requires debridement. The majority of this is epithelialized however he she there is a refractory part to this. 10/20; left Achilles 100% better looking wound surface. Necrotic upper 50% seems to have resolved at least by inspection under illumination. Culture I did of this area last week postdebridement showed Proteus and Citrobacter. Both sensitive to quinolones and I put him on ciprofloxacin. We have been using silver alginate on the wound on the heel and Santyl and Hydrofera Blue on the right fifth met head. 10/27; left Achilles is measuring much smaller we are using topical gentamicin under silver alginate. Right fifth met head still fibrinous debris on the surface we are using Santyl and Hydrofera Blue. Patient lost his brother suddenly last week he was unable to keep his vascular surgery appointment. 11/3; left Achilles not as viable surface as I might like. Some hypergranulation. Right fifth metatarsal head still completely nonviable surface. It turns out that she was  not using topical gentamicin on the left heel [mother] they have been using Santyl and Hydrofera Blue on the right fifth metatarsal head. The patient did have noninvasive vascular studies on 10/ 28/22. He had triphasic waveforms up to the level of the proximal PTA and then a 50 to 74% stenosis in the mid PTA. Peroneal artery was triphasic as was the anterior tibial. He follows up with Dr. Trula Slade on 11/7. 11/17; since the patient was last here he saw Dr. Trula Slade and went for ANGIOGRAPHY on 11/15/2020. He has wounds on the left Achilles heel and the right lateral fifth metatarsal head. He has known PAD and had previous revascularization in August. On this occasion on the right his right common femoral, profundofemoral and superficial femoral and popliteal arteries were widely patent the dominant vessel is the posterior tibial which had several areas of stenosis greater than 80% he  went underwent angioplasty in this area. He had no interventions on the left however he is left common femoral, profundofemoral and superficial femoral arteries were widely patent as well as the popliteal he has recurrent stenosis greater than 50% within the peroneal artery which is a single- vessel runoff. The thought was that he may require further angiography and reangioplasty on the left peroneal artery. Overall the patient has severe PAD he has undergone angioplasty of the right posterior tibial artery. He arrives in clinic today with the Achilles heel area on the left surprisingly better with marked improvement in the surface area on the right not nearly as good but stable 12/8; patient has follow-up arterial Doppler studies on 12[facility] He is now being revascularized on both sides. Fortunately on the right Achilles area this is just about closed slight scabbing/eschar. I did not remove this today. The area on the right fifth met head looked better after last week's debridement we have been using  Santyl and Hydrofera Blue 01/05/21 the patient has not been here in almost a month. He was supposed to have follow-up Doppler studies on 12/19 look these up. He has been revascularized on both sides The left Achilles area is closed he does not need to dress this but will need to protect this area on an ongoing basis likely indefinitely. He still has the area on the right fifth plantar metatarsal head. The area is measuring smaller he has a rim of epithelialization. The granulation tissue still a bit gelatinous for my liking 1/26; The left Achilles remains closed. Plantar right fifth metatarsal head is smaller and appears to be healthy. He has not followed up with vascular surgery and does not seem particularly interested. He did not get his follow-up arterial Dopplers. I explained to him that angioplasty of the small arteries in his leg is a limb saving procedure but possibility of restenosis is reasonably high and he should follow-up with follow-up noninvasive studies. Frankly he did not seem interested today 03/30/2021: The patient has not been seen here since the end of January. He never followed up with vascular surgery and today states that he refuses to be seen at [facility] due to his dissatisfaction with the fact that his arteries seem to restenose. The wound on his left Achilles is still closed. The plantar right fifth metatarsal head is quite a bit bigger than it was at his last visit. He is accompanied by his mother who reports that it had gotten down to a very small size but then she went out of town for a week and during that time it enlarged to its current dimensions. The patient is wearing the functional equivalent of house slippers for shoes and is not making any effort to offload. He refuses to wear a forefoot offloading sandal due to instability on his feet and says that when a total contact cast was attempted, it was "a total disaster" and he is unwilling to consider this again. He has been in  PolyMem silver since at least the beginning of January. 04/26/2021: The patient is here today after about a month. He has been using Hydrofera Blue to his wound and today it is markedly improved. It is small and clean. He is scheduled to see Dr. Gwenlyn Found in interventional cardiology tomorrow. 05/24/2021: Once again, the patient returns after about a month. I had referred him to Dr. Gwenlyn Found and the patient walked out from that appointment. Apparently there was some confusion over why he  was seeing the cardiologist for his peripheral arterial disease. He saw his primary cardiologist, Dr. Davina Poke, last week and has been re-referred to see Dr. Gwenlyn Found, the appointment is tomorrow. Basically the wound is a little bit bigger today but clean without any slough accumulation. The periwound callus that has accumulated is completely macerated. I thought he had been in [facility], but the dressing that had been removed and discarded today looks more like PolyMem Ag. 05/31/2021: The patient decided not to see Dr. Gwenlyn Found because he feels like anytime he has a vascular intervention, the arteries just closed up again and he does not want to do this anymore. He does not really grasp the nature of the disease process. The wound as a bit smaller today and very clean. No accumulation of periwound callus. We have been using Hydrofera Blue. Electronic Signature(s) Signed: 05/31/2021 9:13:18 AM By: Fredirick Maudlin MD FACS Entered By: Fredirick Maudlin on 05/31/2021 09:13:18 -------------------------------------------------------------------------------- Physical Exam Details Patient Name: Date of Service: [name] [name] [facility] THA N L. 05/31/2021 8:30 A M Medical Record Number: [id number] Patient Account Number: [id number] Date of Birth/Sex: Treating RN: 1955/06/01 (66 y.o. M) Primary Care Provider: [name], Alyssa Other Clinician: Referring Provider: Treating Provider/Extender: Fredirick Maudlin [name], Alyssa Weeks in  Treatment: 34 Constitutional He is hypertensive, but asymptomatic.. Bradycardic, asymptomatic.. . . No acute distress. Respiratory Normal work of breathing on room air. Notes 05/31/2021: The wound on the right fifth metatarsal head is smaller today. It is clean without any slough accumulation. No reaccumulation of periwound callus. Electronic Signature(s) Signed: 05/31/2021 9:14:33 AM By: Fredirick Maudlin MD FACS Entered By: Fredirick Maudlin on 05/31/2021 09:14:33 -------------------------------------------------------------------------------- Physician Orders Details Patient Name: Date of Service: [name] [name] [facility] THA N L. 05/31/2021 8:30 A M Medical Record Number: [id number] Patient Account Number: [id number] Date of Birth/Sex: Treating RN: Dec 11, 1955 (66 y.o. [name] Primary Care Provider: [name], Alyssa Other Clinician: Referring Provider: Treating Provider/Extender: Orland Jarred in Treatment: 91 Verbal / Phone Orders: No Diagnosis Coding ICD-10 Coding Code Description L97.511 Non-pressure chronic ulcer of other part of right foot limited to breakdown of skin E11.621 Type 2 diabetes mellitus with foot ulcer E11.51 Type 2 diabetes mellitus with diabetic peripheral angiopathy without gangrene E11.42 Type 2 diabetes mellitus with diabetic polyneuropathy Follow-up Appointments ppointment in 1 week. - Dr. Celine Ahr - Room 2 - 6/7 at 9:15 AM Return A Bathing/ Shower/ Hygiene May shower and wash wound with soap and water. - with dressing changes only. Edema Control - Lymphedema / SCD / Other Elevate legs to the level of the heart or above for 30 minutes daily and/or when sitting, a frequency of: - 3-4 times a day throughout the day. Moisturize legs daily. - every night before bed. *** closely monitor feet every night before bed. Off-Loading Other: - float heels while resting in bed or chair. Use a pillow or rolled sheet under legs to offload the  heels. Ensure no pressure to right lateral foot. Home Health New wound care orders this week; continue Home Health for wound care. May utilize formulary equivalent dressing for wound treatment orders unless otherwise specified. Other Home Health Orders/Instructions: - [facility] home health weekly changes and all other days family to change. Wound Treatment Wound #3 - Foot Wound Laterality: Right, Lateral Cleanser: Soap and Water Every Other Day/30 Days Discharge Instructions: May shower and wash wound with dial antibacterial soap and water prior to dressing change. Cleanser: Wound Cleanser Every Other Day/30 Days Discharge Instructions: Cleanse  the wound with wound cleanser prior to applying a clean dressing using gauze sponges, not tissue or cotton balls. Cleanser: [place]ram Ancillary Kit - 15 Day Supply (Generic) Every Other Day/30 Days Discharge Instructions: Use supplies as instructed; Kit contains: (15) Saline Bullets; (15) 3x3 Gauze; 15 pr Gloves Prim Dressing: Hydrofera Blue Classic Foam, 2x2 in (Generic) Every Other Day/30 Days ary Discharge Instructions: Moisten with saline prior to applying to wound bed Secondary Dressing: Bordered Gauze, 4x4 in (Generic) Every Other Day/30 Days Discharge Instructions: Apply over primary dressing as directed. Electronic Signature(s) Signed: 05/31/2021 12:04:28 PM By: Fredirick Maudlin MD FACS Entered By: Fredirick Maudlin on 05/31/2021 09:14:51 -------------------------------------------------------------------------------- Problem List Details Patient Name: Date of Service: [name] [name] [facility] THA N L. 05/31/2021 8:30 A M Medical Record Number: [id number] Patient Account Number: [id number] Date of Birth/Sex: Treating RN: June 15, 1955 (66 y.o. M) Primary Care Provider: [name], Alyssa Other Clinician: Referring Provider: Treating Provider/Extender: Orland Jarred in Treatment: 56 Active Problems ICD-10 Encounter Code  Description Active Date MDM Diagnosis L97.511 Non-pressure chronic ulcer of other part of right foot limited to breakdown of 07/07/2020 No Yes skin E11.621 Type 2 diabetes mellitus with foot ulcer 07/07/2020 No Yes E11.51 Type 2 diabetes mellitus with diabetic peripheral angiopathy without gangrene 09/01/2020 No Yes E11.42 Type 2 diabetes mellitus with diabetic polyneuropathy 07/07/2020 No Yes Inactive Problems ICD-10 Code Description Active Date Inactive Date L97.528 Non-pressure chronic ulcer of other part of left foot with other specified severity 07/07/2020 07/07/2020 L89.152 Pressure ulcer of sacral region, stage 2 07/07/2020 07/07/2020 E36.629 Pressure ulcer of left heel, stage 2 07/07/2020 07/07/2020 Resolved Problems Electronic Signature(s) Signed: 05/31/2021 9:11:49 AM By: Fredirick Maudlin MD FACS Entered By: Fredirick Maudlin on 05/31/2021 09:11:49 -------------------------------------------------------------------------------- Progress Note Details Patient Name: Date of Service: [name] [name] [facility] THA N L. 05/31/2021 8:30 A M Medical Record Number: [id number] Patient Account Number: [id number] Date of Birth/Sex: Treating RN: [birth year] (66 y.o. M) Primary Care Provider: [name], Alyssa Other Clinician: Referring Provider: Treating Provider/Extender: Orland Jarred in Treatment: 49 Subjective Chief Complaint Information obtained from Patient 07/07/2020; patient is in clinic for today for review of pressure ulcers on his coccyx and left heel and diabetic foot ulcers bilaterally. History of Present Illness (HPI) ADMISSION 07/07/2020 This is a 65 year old man who is a type II diabetic. I have reviewed this in epic. It appeared that his diabetes was diagnosed in 2009 at which time he was on metformin. He was followed for a period of time by an endocrinologist with [facility] to always labeled him as a type II diabetic. Recently in epic there is been a switch to type 1 diabetes. If  there were defining antibody test done I do not see them and I think the totality of the evidence would suggest that he is a type II diabetic. His recent problem started in May when he fell down 17 stairs. He suffered a scapular fracture he was hospitalized at [facility] for 9 days and then discharged to [facility]. His mother who is present said that most of these wounds seem to develop surrounding the discharge from hospital or the stay at [facility]. He has an area on his lower sacrum which appears currently to be a stage II but per their description this is actually improved quite a bit. He has an area on the left lateral foot at roughly the base of the fifth metatarsal, the left Achilles heel and the right fifth metatarsal head laterally they have  been using topical antibiotics. Seen by primary care yesterday and placed on Keflex The patient had arterial studies on 05/05/2020. I wondered whether this had something to do with wounds on his feet at that time but I have not been able to determine this. At that time his ABI on the right was 1.09 with a TBI of 0.65 on the left 0.88 with a TBI of 0.44. Waveforms were triphasic on the right monophasic on the left. He likely has significant PAD on the left Past medical history includes type 2 diabetes poorly controlled last hemoglobin A1c at 12.7 in May, hypertension, hyperlipidemia, atrial fib on Eliquis, coronary artery disease status post CABG, left scapular fracture in May of this year, AAA repair. Bilateral leg weakness ABIs were not done in our clinic as they were just done in [facility]; patient arrives back in clinic he has areas on the left Achilles heel, base of the left fifth metatarsal, right lateral fifth metatarsal. The area he had over the coccyx appears to be healed but this area remains vulnerable. We have been using Iodoflex. I rereviewed his ABIs which were done in May. I do not know exactly what prompted  these test however they do show a TBI of 0.44 on the left and monophasic waveforms. 7/21; the patient's wounds are about the same. Certainly no healing and the nurses noted some green drainage on the right fifth metatarsal head, left Achilles a little larger. We have been using silver alginate really without any improvement. We did get an appointment with vascular surgery however its that it did the end of August and they seem to want to repeat the noninvasive test that they have already done in May. I am not sure what the issue here is. We advised the patient's sister to call and point to the idea that they have already had noninvasive studies. The real question I have is does this patient need an angiogram. He is a diabetic and it is possible that the ABIs are falsely elevated because of medial calcification. At least on the left his TBI was abnormal 08/08/2020 upon evaluation today patient appears to be doing about the same in regard to his wounds. He still waiting the appointment with vascular. This is something that hopefully should be undertaken shortly. The 29th of this month is when he is actually scheduled although they have put in a call to move this up if at all possible. Fortunately there does not appear to be any signs of active infection at this time. No fevers, chills, nausea, vomiting, or diarrhea. 8/18; appointment with Dr. Trula Slade on 8/29. The patient has wounds on the left Achilles heel, left lateral foot and the right lateral fifth metatarsal head. We have been using Santyl under Hydrofera Blue. The surface of especially the left heel looks better to me than what I remember. 9/1; since the patient was last here he underwent angiography by Dr. Trula Slade he also underwent noninvasive studies first showing a ABI of of 0.64 with monophasic waveforms. His TBI was 0.35 with a left great toe pressure of 46 this is about the same pattern as we saw previously. The left was a lot worse than the  right. He underwent an angiogram on 08/29/2020. He had multiple 80% peroneal artery stenosis which was his dominant vessel which was treated with angioplasty with no residual stenosis. 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 The anterior tibial artery was also occluded. An attempt  was made at recanalization of the anterior tibial artery however this was unsuccessful. Noted that the peroneal artery reconstitutes the posterior tibial artery and the anterior tibial artery creating the plantar arch. The patient has areas on the Achilles left heel left lateral foot and right lateral fifth metatarsal head we have been using Santyl and Hydrofera Blue 9/22; since the patient was last here he went for repeat vascular studies. On the right his ABI was 0.94 with biphasic waveforms TBI of 0.73 with a great toe pressure of 75. On the left which is the problematic wound his ABI was 1.08 but with biphasic waveforms at the PTA. Biphasic waveforms at the dorsalis pedis his TBI was 0.63 with a great toe pressure of 65. On the left however this does represent a fairly marked improvement. We have been using Santyl and Hydrofera Blue 9/29 left Achilles area. Still is having necrotic surface. Bleeds very easily as the patient is on a combination of Eliquis aspirin and Plavix just stopped yesterday. I am not going to do any debridement until next week. We are using Santyl and Hydrofera Blue. His arterial status on the right was actually quite good. The area on the fifth metatarsal head needs additional debridement. A total contact cast in this area is not out of the question. He does describe claudication I believe with minimal activity. He is not very active X-ray we ordered last week is still pending in terms of the report 10/6 left Achilles area. In terms of the necrotic surface this is quite a bit better. There is still some debris in the posterior part of the wound however this looks better. Anteriorly the wound  appears to be epithelialized there is still a divot in the middle of this but this does not probe to deep tissue. The area on the right fifth metatarsal head laterally really is not making as much progress as I would have liked. Still not completely a viable surface. Both the patient and his wife agree that he is not an external rotator in terms of ambulation [an outie]. We have been using Santyl and Hydrofera Blue for a prolonged period. On the left Achilles which looked like it is worse wound this seems to be making some progress not so much on the right foot. The patient and his wife state that they are rigorous about offloading both of these areas He was treated with angioplasty as I believe on the right peroneal artery that had multiple stenosis. This is a dominant vessel the anterior tibial and posterior tibial arteries are occluded however the foot is fed via collaterals from the peroneal. From my notes I am not able to understand the vascular supply on the left I will have to review this. 10/13; left Achilles top part of this looks healthy granulation in the bottom part of this wound again completely necrotic requiring debridement. oo The area on the right lateral fifth met head also requires debridement. The majority of this is epithelialized however he she there is a refractory part to this. 10/20; left Achilles 100% better looking wound surface. Necrotic upper 50% seems to have resolved at least by inspection under illumination. Culture I did of this area last week postdebridement showed Proteus and Citrobacter. Both sensitive to quinolones and I put him on ciprofloxacin. We have been using silver alginate on the wound on the heel and Santyl and Hydrofera Blue on the right fifth met head. 10/27; left Achilles is measuring much smaller we are using topical  gentamicin under silver alginate. Right fifth met head still fibrinous debris on the surface we are using Santyl and Hydrofera  Blue. Patient lost his brother suddenly last week he was unable to keep his vascular surgery appointment. 11/3; left Achilles not as viable surface as I might like. Some hypergranulation. Right fifth metatarsal head still completely nonviable surface. It turns out that she was not using topical gentamicin on the left heel [mother] they have been using Santyl and Hydrofera Blue on the right fifth metatarsal head. The patient did have noninvasive vascular studies on 10/ 28/22. He had triphasic waveforms up to the level of the proximal PTA and then a 50 to 74% stenosis in the mid PTA. Peroneal artery was triphasic as was the anterior tibial. He follows up with Dr. Trula Slade on 11/7. 11/17; since the patient was last here he saw Dr. Trula Slade and went for ANGIOGRAPHY on 11/15/2020. He has wounds on the left Achilles heel and the right lateral fifth metatarsal head. He has known PAD and had previous revascularization in August. On this occasion on the right his right common femoral, profundofemoral and superficial femoral and popliteal arteries were widely patent the dominant vessel is the posterior tibial which had several areas of stenosis greater than 80% he went underwent angioplasty in this area. He had no interventions on the left however he is left common femoral, profundofemoral and superficial femoral arteries were widely patent as well as the popliteal he has recurrent stenosis greater than 50% within the peroneal artery which is a single- vessel runoff. The thought was that he may require further angiography and reangioplasty on the left peroneal artery. Overall the patient has severe PAD he has undergone angioplasty of the right posterior tibial artery. He arrives in clinic today with the Achilles heel area on the left surprisingly better with marked improvement in the surface area on the right not nearly as good but stable 12/8; patient has follow-up arterial Doppler studies on 12/19 at the vein  and vascular. He is now being revascularized on both sides. Fortunately on the right Achilles area this is just about closed slight scabbing/eschar. I did not remove this today. The area on the right fifth met head looked better after last week's debridement we have been using Santyl and Hydrofera Blue 01/05/21 the patient has not been here in almost a month. He was supposed to have follow-up Doppler studies on 12/19 look these up. He has been revascularized on both sides The left Achilles area is closed he does not need to dress this but will need to protect this area on an ongoing basis likely indefinitely. He still has the area on the right fifth plantar metatarsal head. The area is measuring smaller he has a rim of epithelialization. The granulation tissue still a bit gelatinous for my liking 1/26; The left Achilles remains closed. Plantar right fifth metatarsal head is smaller and appears to be healthy. He has not followed up with vascular surgery and does not seem particularly interested. He did not get his follow-up arterial Dopplers. I explained to him that angioplasty of the small arteries in his leg is a limb saving procedure but possibility of restenosis is reasonably high and he should follow-up with follow-up noninvasive studies. Frankly he did not seem interested today 03/30/2021: The patient has not been seen here since the end of January. He never followed up with vascular surgery and today states that he refuses to be seen at [facility] due to his dissatisfaction with the  fact that his arteries seem to restenose. The wound on his left Achilles is still closed. The plantar right fifth metatarsal head is quite a bit bigger than it was at his last visit. He is accompanied by his mother who reports that it had gotten down to a very small size but then she went out of town for a week and during that time it enlarged to its current dimensions. The patient is wearing the functional equivalent of  house slippers for shoes and is not making any effort to offload. He refuses to wear a forefoot offloading sandal due to instability on his feet and says that when a total contact cast was attempted, it was "a total disaster" and he is unwilling to consider this again. He has been in PolyMem silver since at least the beginning of January. 04/26/2021: The patient is here today after about a month. He has been using Hydrofera Blue to his wound and today it is markedly improved. It is small and clean. He is scheduled to see Dr. Gwenlyn Found in interventional cardiology tomorrow. 05/24/2021: Once again, the patient returns after about a month. I had referred him to Dr. Gwenlyn Found and the patient walked out from that appointment. Apparently there was some confusion over why he was seeing the cardiologist for his peripheral arterial disease. He saw his primary cardiologist, Dr. Davina Poke, last week and has been re-referred to see Dr. Gwenlyn Found, the appointment is tomorrow. Basically the wound is a little bit bigger today but clean without any slough accumulation. The periwound callus that has accumulated is completely macerated. I thought he had been in [facility], but the dressing that had been removed and discarded today looks more like PolyMem Ag. 05/31/2021: The patient decided not to see Dr. Gwenlyn Found because he feels like anytime he has a vascular intervention, the arteries just closed up again and he does not want to do this anymore. He does not really grasp the nature of the disease process. The wound as a bit smaller today and very clean. No accumulation of periwound callus. We have been using Hydrofera Blue. Patient History Information obtained from Patient. Family History Cancer - Paternal Grandparents,Father, Heart Disease - Father, Hypertension - Father, No family history of Diabetes, Hereditary Spherocytosis, Kidney Disease, Lung Disease, Seizures, Stroke, Thyroid Problems, Tuberculosis. Social  History Current some day smoker - Landscape architect, Marital Status - Divorced, Alcohol Use - Rarely, Drug Use - No History, Caffeine Use - Daily. Medical History Cardiovascular Patient has history of Coronary Artery Disease, Hypertension Endocrine Patient has history of Type II Diabetes Musculoskeletal Patient has history of Osteoarthritis Neurologic Patient has history of Neuropathy Objective Constitutional He is hypertensive, but asymptomatic.. Bradycardic, asymptomatic.Marland Kitchen No acute distress. Vitals Time Taken: 8:36 AM, Height: 74 in, Weight: 151 lbs, BMI: 19.4, Temperature: 98.6 F, Pulse: 55 bpm, Respiratory Rate: 18 breaths/min, Blood Pressure: 151/77 mmHg, Capillary Blood Glucose: 388 mg/dl. Respiratory Normal work of breathing on room air. General Notes: 05/31/2021: The wound on the right fifth metatarsal head is smaller today. It is clean without any slough accumulation. No reaccumulation of periwound callus. Integumentary (Hair, Skin) Wound #3 status is Open. Original cause of wound was Gradually Appeared. The date acquired was: 06/21/2020. The wound has been in treatment 46 weeks. The wound is located on the Right,Lateral Foot. The wound measures 1cm length x 0.8cm width x 0.1cm depth; 0.628cm^2 area and 0.063cm^3 volume. There is Fat Layer (Subcutaneous Tissue) exposed. There is no tunneling or undermining noted. There is a medium  amount of serosanguineous drainage noted. The wound margin is flat and intact. There is large (67-100%) red, pink granulation within the wound bed. There is no necrotic tissue within the wound bed. Assessment Active Problems ICD-10 Non-pressure chronic ulcer of other part of right foot limited to breakdown of skin Type 2 diabetes mellitus with foot ulcer Type 2 diabetes mellitus with diabetic peripheral angiopathy without gangrene Type 2 diabetes mellitus with diabetic polyneuropathy Plan Follow-up Appointments: Return Appointment in 1 week. - Dr. Celine Ahr -  Room 2 - 6/7 at 9:15 AM Bathing/ Shower/ Hygiene: May shower and wash wound with soap and water. - with dressing changes only. Edema Control - Lymphedema / SCD / Other: Elevate legs to the level of the heart or above for 30 minutes daily and/or when sitting, a frequency of: - 3-4 times a day throughout the day. Moisturize legs daily. - every night before bed. *** closely monitor feet every night before bed. Off-Loading: Other: - float heels while resting in bed or chair. Use a pillow or rolled sheet under legs to offload the heels. Ensure no pressure to right lateral foot. Home Health: New wound care orders this week; continue Home Health for wound care. May utilize formulary equivalent dressing for wound treatment orders unless otherwise specified. Other Home Health Orders/Instructions: - [facility] home health weekly changes and all other days family to change. WOUND #3: - Foot Wound Laterality: Right, Lateral Cleanser: Soap and Water Every Other Day/30 Days Discharge Instructions: May shower and wash wound with dial antibacterial soap and water prior to dressing change. Cleanser: Wound Cleanser Every Other Day/30 Days Discharge Instructions: Cleanse the wound with wound cleanser prior to applying a clean dressing using gauze sponges, not tissue or cotton balls. Cleanser: [place] Ancillary Kit - 15 Day Supply (Generic) Every Other Day/30 Days Discharge Instructions: Use supplies as instructed; Kit contains: (15) Saline Bullets; (15) 3x3 Gauze; 15 pr Gloves Prim Dressing: Hydrofera Blue Classic Foam, 2x2 in (Generic) Every Other Day/30 Days ary Discharge Instructions: Moisten with saline prior to applying to wound bed Secondary Dressing: Bordered Gauze, 4x4 in (Generic) Every Other Day/30 Days Discharge Instructions: Apply over primary dressing as directed. 05/31/2021: The wound on the right fifth metatarsal head is smaller today. It is clean without any slough accumulation. No reaccumulation of  periwound callus. No debridement was necessary today we will continue using the [facility]. Of note, the patient reports that he has run out of insulin and his glucose is very high. We are unable to treat that here in our office and he was advised to proceed to the emergency department for treatment. Follow-up in 1 week. Electronic Signature(s) Signed: 05/31/2021 9:15:34 AM By: Fredirick Maudlin MD FACS Entered By: Fredirick Maudlin on 05/31/2021 09:15:33 -------------------------------------------------------------------------------- HxROS Details Patient Name: Date of Service: [name] [name] HNA THA N L. 05/31/2021 8:30 A M Medical Record Number: [id number] Patient Account Number: [id number] Date of Birth/Sex: Treating RN: 11-21-1955 (66 y.o. M) Primary Care Provider: [name], Alyssa Other Clinician: Referring Provider: Treating Provider/Extender: Orland Jarred in Treatment: 36 Information Obtained From Patient Cardiovascular Medical History: Positive for: Coronary Artery Disease; Hypertension Endocrine Medical History: Positive for: Type II Diabetes Treated with: Insulin Blood sugar tested every day: Yes Tested : CGM Musculoskeletal Medical History: Positive for: Osteoarthritis Neurologic Medical History: Positive for: Neuropathy Immunizations Pneumococcal Vaccine: Received Pneumococcal Vaccination: Yes Received Pneumococcal Vaccination On or After 60th Birthday: Yes Implantable Devices None Family and Social History Cancer: Yes - Paternal Grandparents,Father; Diabetes:  No; Heart Disease: Yes - Father; Hereditary Spherocytosis: No; Hypertension: Yes - Father; Kidney Disease: No; Lung Disease: No; Seizures: No; Stroke: No; Thyroid Problems: No; Tuberculosis: No; Current some day smoker - Cigars; Marital Status - Divorced; Alcohol Use: Rarely; Drug Use: No History; Caffeine Use: Daily; Financial Concerns: No; Food, Clothing or Shelter Needs: No;  Support System Lacking: No; Transportation Concerns: No Electronic Signature(s) Signed: 05/31/2021 12:04:28 PM By: Fredirick Maudlin MD FACS Entered By: Fredirick Maudlin on 05/31/2021 09:13:30 -------------------------------------------------------------------------------- SuperBill Details Patient Name: Date of Service: [name] [name] HNA THA N L. 05/31/2021 Medical Record Number: [id number] Patient Account Number: [id number] Date of Birth/Sex: Treating RN: 1955/07/18 (66 y.o. [name] Primary Care Provider: [name], Alyssa Other Clinician: Referring Provider: Treating Provider/Extender: Thana Ates, Alyssa Weeks in Treatment: 46 Diagnosis Coding ICD-10 Codes Code Description L97.511 Non-pressure chronic ulcer of other part of right foot limited to breakdown of skin E11.621 Type 2 diabetes mellitus with foot ulcer E11.51 Type 2 diabetes mellitus with diabetic peripheral angiopathy without gangrene E11.42 Type 2 diabetes mellitus with diabetic polyneuropathy Facility Procedures CPT4 Code: [id number] Description: 99213 - WOUND CARE VISIT-LEV 3 EST PT Modifier: Quantity: 1 Physician Procedures : CPT4 Code Description Modifier [id number] - WC PHYS LEVEL 3 - EST PT ICD-10 Diagnosis Description L97.511 Non-pressure chronic ulcer of other part of right foot limited to breakdown of skin E11.621 Type 2 diabetes mellitus with foot ulcer E11.42  Type 2 diabetes mellitus with diabetic polyneuropathy E11.51 Type 2 diabetes mellitus with diabetic peripheral angiopathy without gangrene Quantity: 1 Electronic Signature(s) Signed: 05/31/2021 9:15:50 AM By: Fredirick Maudlin MD FACS Entered By: Fredirick Maudlin on 05/31/2021 09:15:50

## 2021-05-31 NOTE — Progress Notes (Signed)
HARSHITH, PURSELL ([id number]) Visit Report for 05/31/2021 Arrival Information Details Patient Name: Date of Service: [name] [facility] THA N L. 05/31/2021 8:30 A M Medical Record Number: [id number] Patient Account Number: [id number] Date of Birth/Sex: Treating RN: 1955/01/11 (66 y.o. [name] Primary Care Chanese Hartsough: [name], Alyssa Other Clinician: Referring Yazlyn Wentzel: Treating Lariza Cothron/Extender: Orland Jarred in Treatment: 87 Visit Information History Since Last Visit Added or deleted any medications: No Patient Arrived: Walker Any new allergies or adverse reactions: No Arrival Time: 08:30 Had a fall or experienced change in No Accompanied By: mother activities of daily living that may affect Transfer Assistance: None risk of falls: Patient Identification Verified: Yes Signs or symptoms of abuse/neglect since last visito No Secondary Verification Process Completed: Yes Hospitalized since last visit: No Patient Requires Transmission-Based Precautions: No Implantable device outside of the clinic excluding No Patient Has Alerts: Yes cellular tissue based products placed in the center Patient Alerts: Patient on Blood Thinner since last visit: Has Dressing in Place as Prescribed: Yes Pain Present Now: Yes Electronic Signature(s) Signed: 05/31/2021 5:12:22 PM By: Adline Peals Entered By: Adline Peals on 05/31/2021 08:35:42 -------------------------------------------------------------------------------- Clinic Level of Care Assessment Details Patient Name: Date of Service: [name] [facility] THA N L. 05/31/2021 8:30 A M Medical Record Number: [id number] Patient Account Number: [id number] Date of Birth/Sex: Treating RN: [birth year] (66 y.o. [name] Primary Care Judieth Mckown: [name], Alyssa Other Clinician: Referring Dreana Britz: Treating Tashe Purdon/Extender: Thana Ates, Alyssa Weeks in Treatment: 67 Clinic Level of  Care Assessment Items TOOL 4 Quantity Score X- 1 0 Use when only an EandM is performed on FOLLOW-UP visit ASSESSMENTS - Nursing Assessment / Reassessment X- 1 10 Reassessment of Co-morbidities (includes updates in patient status) X- 1 5 Reassessment of Adherence to Treatment Plan ASSESSMENTS - Wound and Skin A ssessment / Reassessment X - Simple Wound Assessment / Reassessment - one wound 1 5 []  - 0 Complex Wound Assessment / Reassessment - multiple wounds []  - 0 Dermatologic / Skin Assessment (not related to wound area) ASSESSMENTS - Focused Assessment X- 1 5 Circumferential Edema Measurements - multi extremities []  - 0 Nutritional Assessment / Counseling / Intervention X- 1 5 Lower Extremity Assessment (monofilament, tuning fork, pulses) []  - 0 Peripheral Arterial Disease Assessment (using hand held doppler) ASSESSMENTS - Ostomy and/or Continence Assessment and Care []  - 0 Incontinence Assessment and Management []  - 0 Ostomy Care Assessment and Management (repouching, etc.) PROCESS - Coordination of Care X - Simple Patient / Family Education for ongoing care 1 15 []  - 0 Complex (extensive) Patient / Family Education for ongoing care X- 1 10 Staff obtains Programmer, systems, Records, T Results / Process Orders est []  - 0 Staff telephones HHA, Nursing Homes / Clarify orders / etc []  - 0 Routine Transfer to another Facility (non-emergent condition) []  - 0 Routine Hospital Admission (non-emergent condition) []  - 0 New Admissions / Biomedical engineer / Ordering NPWT Apligraf, etc. , []  - 0 Emergency Hospital Admission (emergent condition) X- 1 10 Simple Discharge Coordination []  - 0 Complex (extensive) Discharge Coordination PROCESS - Special Needs []  - 0 Pediatric / Minor Patient Management []  - 0 Isolation Patient Management []  - 0 Hearing / Language / Visual special needs []  - 0 Assessment of Community assistance (transportation, D/C planning, etc.) []  -  0 Additional assistance / Altered mentation []  - 0 Support Surface(s) Assessment (bed, cushion, seat, etc.) INTERVENTIONS - Wound Cleansing / Measurement X - Simple Wound Cleansing - one  wound 1 5 []  - 0 Complex Wound Cleansing - multiple wounds X- 1 5 Wound Imaging (photographs - any number of wounds) []  - 0 Wound Tracing (instead of photographs) X- 1 5 Simple Wound Measurement - one wound []  - 0 Complex Wound Measurement - multiple wounds INTERVENTIONS - Wound Dressings X - Small Wound Dressing one or multiple wounds 1 10 []  - 0 Medium Wound Dressing one or multiple wounds []  - 0 Large Wound Dressing one or multiple wounds []  - 0 Application of Medications - topical []  - 0 Application of Medications - injection INTERVENTIONS - Miscellaneous []  - 0 External ear exam []  - 0 Specimen Collection (cultures, biopsies, blood, body fluids, etc.) []  - 0 Specimen(s) / Culture(s) sent or taken to Lab for analysis []  - 0 Patient Transfer (multiple staff / Civil Service fast streamer / Similar devices) []  - 0 Simple Staple / Suture removal (25 or less) []  - 0 Complex Staple / Suture removal (26 or more) []  - 0 Hypo / Hyperglycemic Management (close monitor of Blood Glucose) []  - 0 Ankle / Brachial Index (ABI) - do not check if billed separately X- 1 5 Vital Signs Has the patient been seen at the hospital within the last three years: Yes Total Score: 95 Level Of Care: New/Established - Level 3 Electronic Signature(s) Signed: 05/31/2021 5:12:22 PM By: Adline Peals Entered By: Adline Peals on 05/31/2021 08:59:03 -------------------------------------------------------------------------------- Encounter Discharge Information Details Patient Name: Date of Service: [name] [facility] THA N L. 05/31/2021 8:30 A M Medical Record Number: [id number] Patient Account Number: [id number] Date of Birth/Sex: Treating RN: June 05, 1955 (66 y.o. [name] Primary Care Tedi Hughson: [name],  Alyssa Other Clinician: Referring Samyukta Cura: Treating Madelena Maturin/Extender: Orland Jarred in Treatment: 28 Encounter Discharge Information Items Discharge Condition: Stable Ambulatory Status: Walker Discharge Destination: Home Transportation: Private Auto Accompanied By: mother Schedule Follow-up Appointment: Yes Clinical Summary of Care: Patient Declined Electronic Signature(s) Signed: 05/31/2021 5:12:22 PM By: Adline Peals Entered By: Adline Peals on 05/31/2021 08:59:40 -------------------------------------------------------------------------------- Lower Extremity Assessment Details Patient Name: Date of Service: [name] [facility] THA N L. 05/31/2021 8:30 A M Medical Record Number: [id number] Patient Account Number: [id number] Date of Birth/Sex: Treating RN: 26-Jan-1955 (66 y.o. [name] Primary Care Bertine Schlottman: [name], Alyssa Other Clinician: Referring Wendle Kina: Treating Dorlisa Savino/Extender: Fredirick Maudlin [name], Alyssa Weeks in Treatment: 46 Edema Assessment Assessed: [Left: No] [Right: No] Edema: [Left: Ye] [Right: s] Calf Left: Right: Point of Measurement: 34 cm From Medial Instep 34.8 cm Ankle Left: Right: Point of Measurement: 10 cm From Medial Instep 29 cm Vascular Assessment Pulses: Dorsalis Pedis Palpable: [Right:Yes] Electronic Signature(s) Signed: 05/31/2021 5:12:22 PM By: Adline Peals Entered By: Adline Peals on 05/31/2021 08:38:39 -------------------------------------------------------------------------------- Multi Wound Chart Details Patient Name: Date of Service: [name] [facility] THA N L. 05/31/2021 8:30 A M Medical Record Number: [id number] Patient Account Number: [id number] Date of Birth/Sex: Treating RN: [birth year] (66 y.o. M) Primary Care Shaheer Bonfield: [name], Alyssa Other Clinician: Referring Dezirae Service: Treating Kamaree Berkel/Extender: Fredirick Maudlin [name], Alyssa Weeks in Treatment:  5 Vital Signs Height(in): 74 Capillary Blood Glucose(mg/dl): 388 Weight(lbs): 151 Pulse(bpm): 37 Body Mass Index(BMI): 19.4 Blood Pressure(mmHg): 151/77 Temperature(F): 98.6 Respiratory Rate(breaths/min): 18 Photos: [N/A:N/A] Right, Lateral Foot N/A N/A Wound Location: Gradually Appeared N/A N/A Wounding Event: Diabetic Wound/Ulcer of the Lower N/A N/A Primary Etiology: Extremity Coronary Artery Disease, N/A N/A Comorbid History: Hypertension, Type II Diabetes, Osteoarthritis, Neuropathy 06/21/2020 N/A N/A Date Acquired: 45 N/A N/A Weeks of Treatment: Open N/A N/A Wound  Status: No N/A N/A Wound Recurrence: 1x0.8x0.1 N/A N/A Measurements L x W x D (cm) 0.628 N/A N/A A (cm) : rea 0.063 N/A N/A Volume (cm) : 68.60% N/A N/A % Reduction in A rea: 84.30% N/A N/A % Reduction in Volume: Grade 2 N/A N/A Classification: Medium N/A N/A Exudate A mount: Serosanguineous N/A N/A Exudate Type: red, brown N/A N/A Exudate Color: Flat and Intact N/A N/A Wound Margin: Large (67-100%) N/A N/A Granulation A mount: Red, Pink N/A N/A Granulation Quality: None Present (0%) N/A N/A Necrotic A mount: Fat Layer (Subcutaneous Tissue): Yes N/A N/A Exposed Structures: Fascia: No Tendon: No Muscle: No Joint: No Bone: No Medium (34-66%) N/A N/A Epithelialization: Treatment Notes Wound #3 (Foot) Wound Laterality: Right, Lateral Cleanser Soap and Water Discharge Instruction: May shower and wash wound with dial antibacterial soap and water prior to dressing change. Wound Cleanser Discharge Instruction: Cleanse the wound with wound cleanser prior to applying a clean dressing using gauze sponges, not tissue or cotton balls. Byram Ancillary Kit - 15 Day Supply Discharge Instruction: Use supplies as instructed; Kit contains: (15) Saline Bullets; (15) 3x3 Gauze; 15 pr Gloves Peri-Wound Care Topical Primary Dressing Hydrofera Blue Classic Foam, 2x2 in Discharge Instruction:  Moisten with saline prior to applying to wound bed Secondary Dressing Bordered Gauze, 4x4 in Discharge Instruction: Apply over primary dressing as directed. Secured With Compression Wrap Compression Stockings Environmental education officer) Signed: 05/31/2021 9:11:56 AM By: Fredirick Maudlin MD FACS Entered By: Fredirick Maudlin on 05/31/2021 09:11:56 -------------------------------------------------------------------------------- Multi-Disciplinary Care Plan Details Patient Name: Date of Service: [name] [facility] THA N L. 05/31/2021 8:30 A M Medical Record Number: [id number] Patient Account Number: [id number] Date of Birth/Sex: Treating RN: [birth year] (66 y.o. [name] Primary Care Ioana Louks: [name], Alyssa Other Clinician: Referring Riah Kehoe: Treating Deshay Kirstein/Extender: Orland Jarred in Treatment: [place] reviewed with physician Active Inactive Wound/Skin Impairment Nursing Diagnoses: Knowledge deficit related to ulceration/compromised skin integrity Goals: Patient/caregiver will verbalize understanding of skin care regimen Date Initiated: 07/07/2020 Target Resolution Date: 06/23/2021 Goal Status: Active Interventions: Assess patient/caregiver ability to obtain necessary supplies Assess patient/caregiver ability to perform ulcer/skin care regimen upon admission and as needed Provide education on ulcer and skin care Treatment Activities: Skin care regimen initiated : 07/07/2020 Topical wound management initiated : 07/07/2020 Notes: Electronic Signature(s) Signed: 05/31/2021 5:12:22 PM By: Adline Peals Entered By: Adline Peals on 05/31/2021 08:47:50 -------------------------------------------------------------------------------- Pain Assessment Details Patient Name: Date of Service: [name] [facility] THA N L. 05/31/2021 8:30 A M Medical Record Number: [id number] Patient Account Number: [id number] Date of  Birth/Sex: Treating RN: 31-Jan-1955 (66 y.o. [name] Primary Care Rumaysa Sabatino: [name], Alyssa Other Clinician: Referring Maleki Hippe: Treating Major Santerre/Extender: Thana Ates, Alyssa Weeks in Treatment: 27 Active Problems Location of Pain Severity and Description of Pain Patient Has Paino Yes Site Locations Pain Location: Generalized Pain Duration of the Pain. Constant / Intermittento Intermittent Rate the pain. Current Pain Level: 8 Character of Pain Describe the Pain: Heavy Pain Management and Medication Current Pain Management: Electronic Signature(s) Signed: 05/31/2021 5:12:22 PM By: Adline Peals Entered By: Adline Peals on 05/31/2021 08:36:12 -------------------------------------------------------------------------------- Patient/Caregiver Education Details Patient Name: Date of Service: [name] [facility] THA [place]wood Filler 5/31/2023andnbsp8:30 A M Medical Record Number: [id number] Patient Account Number: [id number] Date of Birth/Gender: Treating RN: 1955-03-24 (66 y.o. [name] Primary Care Physician: [name], Alyssa Other Clinician: Referring Physician: Treating Physician/Extender: Orland Jarred in Treatment: 52 Education Assessment Education Provided To:  Patient Education Topics Provided Wound/Skin Impairment: Methods: Explain/Verbal Responses: Reinforcements needed, State content correctly Electronic Signature(s) Signed: 05/31/2021 5:12:22 PM By: Adline Peals Entered By: Adline Peals on 05/31/2021 08:48:02 -------------------------------------------------------------------------------- Wound Assessment Details Patient Name: Date of Service: [name] [facility] THA N L. 05/31/2021 8:30 A M Medical Record Number: [id number] Patient Account Number: [id number] Date of Birth/Sex: Treating RN: [birth year] (66 y.o. [name] Primary Care Velta Rockholt: [name], Alyssa Other  Clinician: Referring Chondra Boyde: Treating Viaan Knippenberg/Extender: Fredirick Maudlin [name], Alyssa Weeks in Treatment: 46 Wound Status Wound Number: 3 Primary Diabetic Wound/Ulcer of the Lower Extremity Etiology: Wound Location: Right, Lateral Foot Wound Open Wounding Event: Gradually Appeared Status: Date Acquired: 06/21/2020 Comorbid Coronary Artery Disease, Hypertension, Type II Diabetes, Weeks Of Treatment: 46 History: Osteoarthritis, Neuropathy Clustered Wound: No Photos Wound Measurements Length: (cm) 1 Width: (cm) 0.8 Depth: (cm) 0.1 Area: (cm) 0.628 Volume: (cm) 0.063 % Reduction in Area: 68.6% % Reduction in Volume: 84.3% Epithelialization: Medium (34-66%) Tunneling: No Undermining: No Wound Description Classification: Grade 2 Wound Margin: Flat and Intact Exudate Amount: Medium Exudate Type: Serosanguineous Exudate Color: red, brown Foul Odor After Cleansing: No Slough/Fibrino No Wound Bed Granulation Amount: Large (67-100%) Exposed Structure Granulation Quality: Red, Pink Fascia Exposed: No Necrotic Amount: None Present (0%) Fat Layer (Subcutaneous Tissue) Exposed: Yes Tendon Exposed: No Muscle Exposed: No Joint Exposed: No Bone Exposed: No Treatment Notes Wound #3 (Foot) Wound Laterality: Right, Lateral Cleanser Soap and Water Discharge Instruction: May shower and wash wound with dial antibacterial soap and water prior to dressing change. Wound Cleanser Discharge Instruction: Cleanse the wound with wound cleanser prior to applying a clean dressing using gauze sponges, not tissue or cotton balls. Byram Ancillary Kit - 15 Day Supply Discharge Instruction: Use supplies as instructed; Kit contains: (15) Saline Bullets; (15) 3x3 Gauze; 15 pr Gloves Peri-Wound Care Topical Primary Dressing Hydrofera Blue Classic Foam, 2x2 in Discharge Instruction: Moisten with saline prior to applying to wound bed Secondary Dressing Bordered Gauze, 4x4 in Discharge  Instruction: Apply over primary dressing as directed. Secured With Compression Wrap Compression Stockings Add-Ons Electronic Signature(s) Signed: 05/31/2021 5:12:22 PM By: Adline Peals Entered By: Adline Peals on 05/31/2021 08:53:21 -------------------------------------------------------------------------------- Vitals Details Patient Name: Date of Service: [name] [facility] THA N L. 05/31/2021 8:30 A M Medical Record Number: [id number] Patient Account Number: [id number] Date of Birth/Sex: Treating RN: October 31, 1955 (66 y.o. [name] Primary Care Rafi Kenneth: [name], Alyssa Other Clinician: Referring [place]ing Sigmon: Treating Akacia Boltz/Extender: Thana Ates, Alyssa Weeks in Treatment: 19 Vital Signs Time Taken: 08:36 Temperature (F): 98.6 Height (in): 74 Pulse (bpm): 55 Weight (lbs): 151 Respiratory Rate (breaths/min): 18 Body Mass Index (BMI): 19.4 Blood Pressure (mmHg): 151/77 Capillary Blood Glucose (mg/dl): 388 Reference Range: 80 - 120 mg / dl Electronic Signature(s) Signed: 05/31/2021 5:12:22 PM By: Adline Peals Entered By: Adline Peals on 05/31/2021 08:36:40

## 2021-06-01 DIAGNOSIS — M720 Palmar fascial fibromatosis [Dupuytren]: Secondary | ICD-10-CM | POA: Diagnosis not present

## 2021-06-01 DIAGNOSIS — I110 Hypertensive heart disease with heart failure: Secondary | ICD-10-CM | POA: Diagnosis not present

## 2021-06-01 DIAGNOSIS — D649 Anemia, unspecified: Secondary | ICD-10-CM | POA: Diagnosis not present

## 2021-06-01 DIAGNOSIS — L409 Psoriasis, unspecified: Secondary | ICD-10-CM | POA: Diagnosis not present

## 2021-06-01 DIAGNOSIS — F419 Anxiety disorder, unspecified: Secondary | ICD-10-CM | POA: Diagnosis not present

## 2021-06-01 DIAGNOSIS — G8929 Other chronic pain: Secondary | ICD-10-CM | POA: Diagnosis not present

## 2021-06-01 DIAGNOSIS — I480 Paroxysmal atrial fibrillation: Secondary | ICD-10-CM | POA: Diagnosis not present

## 2021-06-01 DIAGNOSIS — N138 Other obstructive and reflux uropathy: Secondary | ICD-10-CM | POA: Diagnosis not present

## 2021-06-01 DIAGNOSIS — M1990 Unspecified osteoarthritis, unspecified site: Secondary | ICD-10-CM | POA: Diagnosis not present

## 2021-06-01 DIAGNOSIS — F3341 Major depressive disorder, recurrent, in partial remission: Secondary | ICD-10-CM | POA: Diagnosis not present

## 2021-06-01 DIAGNOSIS — E1151 Type 2 diabetes mellitus with diabetic peripheral angiopathy without gangrene: Secondary | ICD-10-CM | POA: Diagnosis not present

## 2021-06-01 DIAGNOSIS — I509 Heart failure, unspecified: Secondary | ICD-10-CM | POA: Diagnosis not present

## 2021-06-01 DIAGNOSIS — E1142 Type 2 diabetes mellitus with diabetic polyneuropathy: Secondary | ICD-10-CM | POA: Diagnosis not present

## 2021-06-01 DIAGNOSIS — K5904 Chronic idiopathic constipation: Secondary | ICD-10-CM | POA: Diagnosis not present

## 2021-06-01 DIAGNOSIS — L97511 Non-pressure chronic ulcer of other part of right foot limited to breakdown of skin: Secondary | ICD-10-CM | POA: Diagnosis not present

## 2021-06-01 DIAGNOSIS — E11621 Type 2 diabetes mellitus with foot ulcer: Secondary | ICD-10-CM | POA: Diagnosis not present

## 2021-06-01 DIAGNOSIS — M47812 Spondylosis without myelopathy or radiculopathy, cervical region: Secondary | ICD-10-CM | POA: Diagnosis not present

## 2021-06-01 DIAGNOSIS — F17210 Nicotine dependence, cigarettes, uncomplicated: Secondary | ICD-10-CM | POA: Diagnosis not present

## 2021-06-01 DIAGNOSIS — E559 Vitamin D deficiency, unspecified: Secondary | ICD-10-CM | POA: Diagnosis not present

## 2021-06-01 DIAGNOSIS — E113299 Type 2 diabetes mellitus with mild nonproliferative diabetic retinopathy without macular edema, unspecified eye: Secondary | ICD-10-CM | POA: Diagnosis not present

## 2021-06-01 DIAGNOSIS — I2510 Atherosclerotic heart disease of native coronary artery without angina pectoris: Secondary | ICD-10-CM | POA: Diagnosis not present

## 2021-06-01 DIAGNOSIS — N401 Enlarged prostate with lower urinary tract symptoms: Secondary | ICD-10-CM | POA: Diagnosis not present

## 2021-06-01 DIAGNOSIS — Z794 Long term (current) use of insulin: Secondary | ICD-10-CM | POA: Diagnosis not present

## 2021-06-01 DIAGNOSIS — E785 Hyperlipidemia, unspecified: Secondary | ICD-10-CM | POA: Diagnosis not present

## 2021-06-06 NOTE — Progress Notes (Signed)
[facility] Note  06/09/2021     CHIEF COMPLAINT Patient presents for Retina Follow Up  HISTORY OF PRESENT ILLNESS: [name] [name] is a 66 y.o. male who presents to the clinic today for:   HPI     Retina Follow Up   Patient presents with  Diabetic Retinopathy.  In both eyes.  Severity is moderate.  Duration of 4 weeks.  Since onset it is stable.  I, the attending physician,  performed the HPI with the patient and updated documentation appropriately.        Comments   Patient states vision the same OU.      Last edited by [name], MD on 06/09/2021  1:13 PM.    Pt states vision is stable, his cardiologist said he can have surgery.  Referring physician: [name], [name], PA-C [address] [place],   [address]  HISTORICAL INFORMATION:   Selected notes from the MEDICAL RECORD NUMBER Referred by Dr. [name] for PDR OU   CURRENT MEDICATIONS: No current outpatient medications on file. (Ophthalmic Drugs)   No current facility-administered medications for this visit. (Ophthalmic Drugs)   Current Outpatient Medications (Other)  Medication Sig   apixaban (ELIQUIS) 5 MG TABS tablet Take 1 tablet (5 mg total) by mouth 2 (two) times daily.   aspirin EC 81 MG EC tablet Take 1 tablet (81 mg total) by mouth daily.   atorvastatin (LIPITOR) 40 MG tablet Take 1 tablet (40 mg total) by mouth daily.   B Complex Vitamins (B COMPLEX-B12 PO) Take 1 tablet by mouth daily.    BD PEN NEEDLE NANO 2ND GEN 32G X 4 MM MISC USE FOR INSULIN   Blood Glucose Monitoring Suppl (ONETOUCH VERIO FLEX SYSTEM) w/Device KIT    Cholecalciferol (VITAMIN D) 125 MCG (5000 UT) CAPS Take 5,000 Units by mouth daily.    Continuous Blood Gluc Sensor (DEXCOM G6 SENSOR) MISC 1 Device by Does not apply route See admin instructions. Change every 10 days   Continuous Blood Gluc Transmit (DEXCOM G6 TRANSMITTER) MISC Use as instructed to check blood sugar   furosemide  (LASIX) 40 MG tablet Take 1 tablet (40 mg total) by mouth daily.   Incontinence Supply Disposable (DISPOSABLE BRIEF LARGE) MISC 1 each by Does not apply route as needed.   insulin aspart (NOVOLOG) 100 UNIT/ML injection For use in pump, total of 40 units per day   Insulin Disposable Pump (OMNIPOD 5 G6 INTRO, GEN 5,) KIT 1 Device by Does not apply route every 3 (three) days.   Insulin Disposable Pump (OMNIPOD 5 G6 POD, GEN 5,) MISC APPLY EVERY 3 (THREE) DAYS.   Lancets (ONETOUCH DELICA PLUS [id number]) MISC Apply 1 each topically 4 (four) times daily.   loperamide (IMODIUM A-D) 2 MG tablet Take 1 tablet (2 mg total) by mouth 4 (four) times daily as needed for diarrhea or loose stools.   mirtazapine (REMERON) 15 MG tablet Take 1 tablet (15 mg total) by mouth at bedtime.   ONETOUCH VERIO test strip 1 each 4 (four) times daily.   PARoxetine (PAXIL) 20 MG tablet Take 1 tablet (20 mg total) by mouth daily.   spironolactone (ALDACTONE) 25 MG tablet TAKE ONE TABLET BY MOUTH DAILY   losartan (COZAAR) 25 MG tablet Take 1 tablet (25 mg total) by mouth daily. (Patient not taking: Reported on 05/15/2021)   No current facility-administered medications for this visit. (Other)   REVIEW OF SYSTEMS: ROS   Positive  for: Neurological, Skin, Musculoskeletal, Endocrine, Cardiovascular, Eyes Negative for: Constitutional, Gastrointestinal, Genitourinary, HENT, Respiratory, Psychiatric, Allergic/Imm, Heme/Lymph Last edited by Jobe Marker, COT on 06/09/2021  8:43 AM.     ALLERGIES Not on File  PAST MEDICAL HISTORY Past Medical History:  Diagnosis Date   Anxiety    Arthritis    Cataract    CHF (congestive heart failure) ([place])    Constipation    pt states goes EOD- hard stools    Coronary artery disease    Depression    Diabetes mellitus without complication (HCC)    Diabetic retinopathy ([place])    DKA (diabetic ketoacidoses)    Hyperlipidemia    Hypertension    off meds   Hypertensive retinopathy     Psoriasis    Tubular adenoma of colon 03/2019   Past Surgical History:  Procedure Laterality Date   ABDOMINAL AORTOGRAM W/LOWER EXTREMITY N/A 08/30/2020   Procedure: ABDOMINAL AORTOGRAM W/LOWER EXTREMITY;  Surgeon: Serafina Mitchell, MD;  Location: [facility];  Service: Cardiovascular;  Laterality: N/A;   ABDOMINAL AORTOGRAM W/LOWER EXTREMITY N/A 11/15/2020   Procedure: ABDOMINAL AORTOGRAM W/LOWER EXTREMITY;  Surgeon: Serafina Mitchell, MD;  Location: [facility];  Service: Cardiovascular;  Laterality: N/A;   ANKLE SURGERY     right   CARDIAC CATHETERIZATION  09/25/2001   COLONOSCOPY     ~10 yr ago- normal  per pt    CORONARY ARTERY BYPASS GRAFT  09/30/2001   CABG x 4   IR THORACENTESIS ASP PLEURAL SPACE W/IMG GUIDE  05/19/2020   LAPAROSCOPIC INGUINAL HERNIA REPAIR Bilateral 02/09/2010   MASS EXCISION Left 03/12/2013   Procedure: LEFT INDEX EXCISION MASS AND DEBRIDMENT DISTAL INTERPHALANGEAL JOINT;  Surgeon: Tennis Must, MD;  Location: [facility];  Service: Orthopedics;  Laterality: Left;   PERIPHERAL VASCULAR BALLOON ANGIOPLASTY Left 08/30/2020   Procedure: PERIPHERAL VASCULAR BALLOON ANGIOPLASTY;  Surgeon: Serafina Mitchell, MD;  Location: [facility];  Service: Cardiovascular;  Laterality: Left;  Peroneal   FAMILY HISTORY Family History  Problem Relation Age of Onset   Lymphoma Father    Cancer Paternal Grandmother        Colon Cancer   Colon cancer Paternal Grandmother    Colon polyps Neg Hx    Esophageal cancer Neg Hx    Rectal cancer Neg Hx    Stomach cancer Neg Hx    Diabetes Mellitus I Neg Hx    Pancreatic cancer Neg Hx    SOCIAL HISTORY Social History   Tobacco Use   Smoking status: Some Days    Types: Cigars   Smokeless tobacco: Never  Vaping Use   Vaping Use: Never used  Substance Use Topics   Alcohol use: Yes    Comment: daily beer or scotch    Drug use: No       OPHTHALMIC EXAM: Base Eye Exam     Visual Acuity (Snellen -  Linear)       Right Left   Dist [place] 20/70 -2 CF @1    Dist ph [place] 20/40 NI    Correction: Glasses         Tonometry (Tonopen, 8:53 AM)       Right Left   Pressure 08 06         Pupils       Dark Light Shape React APD   Right 3 2 Round Brisk None   Left 3 2 Round Brisk None  Visual Fields (Counting fingers)       Left Right     Full   Restrictions Partial outer superior temporal, inferior temporal, superior nasal deficiencies          Extraocular Movement       Right Left    Full, Ortho Full, Ortho         Neuro/Psych     Oriented x3: Yes   Mood/Affect: Normal         Dilation     Both eyes: 1.0% Mydriacyl, 2.5% Phenylephrine @ 8:53 AM           Slit Lamp and Fundus Exam     External Exam       Right Left   External Normal Normal         Slit Lamp Exam       Right Left   Lids/Lashes Dermatochalasis - upper lid, Meibomian gland dysfunction Dermatochalasis - upper lid, Meibomian gland dysfunction   Conjunctiva/Sclera White and quiet White and quiet   Cornea arcus, 2-3+ Punctate epithelial erosions arcus, 2+ Punctate epithelial erosions   Anterior Chamber Deep and quiet Deep and quiet   Iris Round and dilated, No NVI Round and dilated, No NVI   Lens 2-3+ Nuclear sclerosis with brunescence, 2+ Cortical cataract 2-3+ Nuclear sclerosis with brunescence, 2-3+ Cortical cataract   Anterior Vitreous Vitreous syneresis, blood stained vitreous condensations persistent Vitreous syneresis, diffuse VH - concentrated centrally, blood stained vitreous condensations, pre-retinal heme collecting inferiorly -- all improving         Fundus Exam       Right Left   Disc Pink and Sharp, NVD - regressed hazy view, Compact, fibrosis   C/D Ratio 0.2 not visible   Macula Flat, good foveal reflex, scatterd MA, +cystic changes/edema temporal fovea and macula -- stably improved hazy view, obscured by VH, grossly attached, +fibrosis   Vessels +NVE just  inside ST arcades - improved, +fibrosis, attenuated, Tortuous attenuated, Tortuous, +NVD; NVE along ST arcades   Periphery Attached; scattered DBH, good 360 PRP with room for fill in Hazy view, Grossly attached with good PRP visible           IMAGING AND PROCEDURES  Imaging and Procedures for 06/09/2021  OCT, Retina - OU - Both Eyes       Right Eye Quality was good. Central Foveal Thickness: 244. Progression has been stable. Findings include no SRF, abnormal foveal contour, intraretinal hyper-reflective material, intraretinal fluid, preretinal fibrosis (Partial PVD, persistent cystic changes temporal macula and fovea, persistent PRF).   Left Eye Quality was poor. Progression has improved. Findings include no SRF, intraretinal hyper-reflective material, intraretinal fluid, vitreous traction (Interval improvement in vitreous opacities, extensive tractional edema and subhyaloid hyper reflective material).   Notes *Images captured and stored on drive  Diagnosis / Impression:  PDR with DME OU OD: Partial PVD, Partial PVD, persistent cystic changes temporal macula and fovea OS: Interval improvement in vitreous opacities, extensive tractional edema and subhyaloid hyper reflective material  Clinical management:  See below  Abbreviations: NFP - Normal foveal profile. CME - cystoid macular edema. PED - pigment epithelial detachment. IRF - intraretinal fluid. SRF - subretinal fluid. EZ - ellipsoid zone. ERM - epiretinal membrane. ORA - outer retinal atrophy. ORT - outer retinal tubulation. SRHM - subretinal hyper-reflective material. IRHM - intraretinal hyper-reflective material      Intravitreal Injection, Pharmacologic Agent - OD - Right Eye       Time Out 06/09/2021. 9:31 AM.  Confirmed correct patient, procedure, site, and patient consented.   Anesthesia Topical anesthesia was used. Anesthetic medications included Lidocaine 2%, Proparacaine 0.5%.   Procedure Preparation included 5%  betadine to ocular surface, eyelid speculum. A supplied (32g) needle was used.   Injection: 1.25 mg Bevacizumab 1.72m/0.05ml   Route: Intravitreal, Site: Right Eye   NDC: 50242-060-01, Lot:: [id number] Expiration date: 07/29/2021   Post-op Post injection exam found visual acuity of at least counting fingers. The patient tolerated the procedure well. There were no complications. The patient received written and verbal post procedure care education. Post injection medications were not given.      Intravitreal Injection, Pharmacologic Agent - OS - Left Eye       Time Out 06/09/2021. 9:31 AM. Confirmed correct patient, procedure, site, and patient consented.   Anesthesia Topical anesthesia was used. Anesthetic medications included Lidocaine 2%, Proparacaine 0.5%.   Procedure Preparation included 5% betadine to ocular surface, eyelid speculum. A (32 g) needle was used.   Injection: 1.25 mg Bevacizumab 1.284m0.05ml   Route: Intravitreal, Site: Left Eye   NDC: : 26378-588-50Lot: : [id number] date: 07/16/2021   Post-op Post injection exam found visual acuity of at least counting fingers. The patient tolerated the procedure well. There were no complications. The patient received written and verbal post procedure care education. Post injection medications were not given.            ASSESSMENT/PLAN:    ICD-10-CM   1. Proliferative diabetic retinopathy of both eyes with macular edema associated with type 2 diabetes mellitus (HCC)  E11.3513 OCT, Retina - OU - Both Eyes    Intravitreal Injection, Pharmacologic Agent - OD - Right Eye    Intravitreal Injection, Pharmacologic Agent - OS - Left Eye    Bevacizumab (AVASTIN) SOLN 1.25 mg    Bevacizumab (AVASTIN) SOLN 1.25 mg    2. Vitreous hemorrhage of left eye (HCC)  H43.12     3. Essential hypertension  I10     4. Hypertensive retinopathy of both eyes  H35.033     5. Combined forms of age-related cataract of both eyes  H25.813       1. Severe proliferative diabetic retinopathy, both eyes  - last A1c 9.2 on 4.20.23; A1c >15% on 10.11.22 - s/p PRP OS (11.10.22)  - s/p PRP OD (12.02.22)  - s/p IVA OS #1 (01.18.23) for VH, #2 (03.17.23), #3 (04.14.23), #4 (05.12.23)  - s/p IVA OD #1 (01.27.23) for macular edema/NVD, #2 (03.17.23), #3 (04.14.23) - pt reports improved BG control - exam shows extensive neovascularization, fibrosis and hemorrhage OU  - OD with improved NVD / VH - OS with persistent, diffuse VH -- slightly improved - BCVA 20/30 OD - improved from 20/40, CF 1' OS - FA (11.09.22) shows areas of vascular non-perfusion OU, +NVD OU and +NVE OU - OCT shows OD: Partial PVD, Partial PVD, persistent cystic changes temporal macula and fovea; OS: Interval improvement in vitreous opacities, extensive tractional edema and subhyaloid hyper reflective material - discussed findings, severity of his disease, prognosis and likely need for extensive treatments -- PRP OU, multiple rounds of anti-VEGF therapy OU and possible surgery - recommend IVA OU #5 today, 06.09.23 for PDR OU - pt wishes to proceed with injections - RBA of procedure discussed, questions answered - informed consent obtained and signed, 01.18.23 - see procedure note - F/u 4 weeks, DFE, OCT  2. Vitreous hemorrhage OS  - [facility] (late Dec 2022[facility] per pt report  -  s/p IVA OS as above  - VH remains diffuse but it is improving  - discussed possible need for PPV if VH fails to clear  - pt has been has been cleared by cardiology to undergo surgery if needed - VH precautions reviewed -- minimize activities, keep head elevated, avoid ASA/NSAIDs/blood thinners as above - f/u 4 weeks, DFE, OCT  3,4. Hypertensive retinopathy OU - discussed importance of tight BP control - monitor   5. Mixed Cataract OU - The symptoms of cataract, surgical options, and treatments and risks were discussed with patient - discussed diagnosis and progression -  monitor   Ophthalmic Meds Ordered this visit:  Meds ordered this encounter  Medications   Bevacizumab (AVASTIN) SOLN 1.25 mg   Bevacizumab (AVASTIN) SOLN 1.25 mg     Return in about 4 weeks (around 07/07/2021) for f/u PDR OU, DFE, OCT.  There are no Patient Instructions on file for this visit.  Explained the diagnoses, plan, and follow up with the patient and they expressed understanding.  Patient expressed understanding of the importance of proper follow up care.   This document serves as a record of services personally performed by [name], MD, PhD. It was created on their behalf by Leonie Douglas, an ophthalmic technician. The creation of this record is the provider's dictation and/or activities during the visit.    Electronically signed by: Leonie Douglas COA, 06/09/21  1:16 PM  This document serves as a record of services personally performed by [name], MD, PhD. It was created on their behalf by San Jetty. Owens Shark, OA an ophthalmic technician. The creation of this record is the provider's dictation and/or activities during the visit.    Electronically signed by: San Jetty. Owens Shark, [place] 06.09.2023 1:16 PM   [name], M.D., Ph.D. Diseases & Surgery of the Retina and Vitreous [facility]  I have reviewed the above documentation for accuracy and completeness, and I agree with the above. [name], M.D., Ph.D. 06/09/21 1:24 PM   Abbreviations: M myopia (nearsighted); A astigmatism; H hyperopia (farsighted); P presbyopia; Mrx spectacle prescription;  CTL contact lenses; OD right eye; OS left eye; OU both eyes  XT exotropia; ET esotropia; PEK punctate epithelial keratitis; PEE punctate epithelial erosions; DES dry eye syndrome; MGD meibomian gland dysfunction; ATs artificial tears; PFAT's preservative free artificial tears; Gideon nuclear sclerotic cataract; PSC posterior subcapsular cataract; ERM epi-retinal membrane; PVD posterior vitreous  detachment; RD retinal detachment; DM diabetes mellitus; DR diabetic retinopathy; NPDR non-proliferative diabetic retinopathy; PDR proliferative diabetic retinopathy; CSME clinically significant macular edema; DME diabetic macular edema; dbh dot blot hemorrhages; CWS cotton wool spot; POAG primary open angle glaucoma; C/D cup-to-disc ratio; HVF humphrey visual field; GVF goldmann visual field; OCT optical coherence tomography; IOP intraocular pressure; BRVO Branch retinal vein occlusion; CRVO central retinal vein occlusion; CRAO central retinal artery occlusion; BRAO branch retinal artery occlusion; RT retinal tear; SB scleral buckle; PPV pars plana vitrectomy; VH Vitreous hemorrhage; PRP panretinal laser photocoagulation; IVK intravitreal kenalog; VMT vitreomacular traction; MH Macular hole;  NVD neovascularization of the disc; NVE neovascularization elsewhere; AREDS age related eye disease study; ARMD age related macular degeneration; POAG primary open angle glaucoma; EBMD epithelial/anterior basement membrane dystrophy; ACIOL anterior chamber intraocular lens; IOL intraocular lens; PCIOL posterior chamber intraocular lens; Phaco/IOL phacoemulsification with intraocular lens placement; [place] photorefractive keratectomy; LASIK laser assisted in situ keratomileusis; HTN hypertension; DM diabetes mellitus; COPD chronic obstructive pulmonary disease

## 2021-06-07 ENCOUNTER — Encounter (HOSPITAL_BASED_OUTPATIENT_CLINIC_OR_DEPARTMENT_OTHER): Payer: HMO | Attending: General Surgery | Admitting: General Surgery

## 2021-06-07 ENCOUNTER — Encounter: Payer: HMO | Attending: Endocrinology | Admitting: Nutrition

## 2021-06-07 ENCOUNTER — Ambulatory Visit: Payer: HMO | Admitting: Nutrition

## 2021-06-07 DIAGNOSIS — E11621 Type 2 diabetes mellitus with foot ulcer: Secondary | ICD-10-CM | POA: Insufficient documentation

## 2021-06-07 DIAGNOSIS — E1142 Type 2 diabetes mellitus with diabetic polyneuropathy: Secondary | ICD-10-CM | POA: Diagnosis not present

## 2021-06-07 DIAGNOSIS — E1151 Type 2 diabetes mellitus with diabetic peripheral angiopathy without gangrene: Secondary | ICD-10-CM | POA: Diagnosis not present

## 2021-06-07 DIAGNOSIS — L97511 Non-pressure chronic ulcer of other part of right foot limited to breakdown of skin: Secondary | ICD-10-CM | POA: Diagnosis not present

## 2021-06-07 DIAGNOSIS — E1065 Type 1 diabetes mellitus with hyperglycemia: Secondary | ICD-10-CM | POA: Insufficient documentation

## 2021-06-07 DIAGNOSIS — L97512 Non-pressure chronic ulcer of other part of right foot with fat layer exposed: Secondary | ICD-10-CM | POA: Diagnosis not present

## 2021-06-07 NOTE — Progress Notes (Signed)
MONTERIO, [name] [name] ([id number]) Visit Report for 06/07/2021 Chief Complaint Document Details Patient Name: Date of Service: [name] [name] [facility] THA N L. 06/07/2021 9:15 A M Medical Record Number: [id number] Patient Account Number: [id number] Date of Birth/Sex: Treating RN: 1955/04/29 (66 y.o. M) Primary Care Provider: [name], Alyssa Other Clinician: Referring Provider: Treating Provider/Extender: Orland Jarred in Treatment: 39 Information Obtained from: Patient Chief Complaint 07/07/2020; patient is in clinic for today for review of pressure ulcers on his coccyx and left heel and diabetic foot ulcers bilaterally. Electronic Signature(s) Signed: 06/07/2021 9:56:31 AM By: Fredirick Maudlin MD FACS Entered By: Fredirick Maudlin on 06/07/2021 09:56:31 -------------------------------------------------------------------------------- Debridement Details Patient Name: Date of Service: [name] [name] [facility] THA N L. 06/07/2021 9:15 A M Medical Record Number: [id number] Patient Account Number: [id number] Date of Birth/Sex: Treating RN: 1955/08/27 (66 y.o. [name] Primary Care Provider: [name], Alyssa Other Clinician: Referring Provider: Treating Provider/Extender: Orland Jarred in Treatment: 47 Debridement Performed for Assessment: Wound #3 Right,Lateral Foot Performed By: Physician Fredirick Maudlin, MD Debridement Type: Debridement Severity of Tissue Pre Debridement: Fat layer exposed Level of Consciousness (Pre-procedure): Awake and Alert Pre-procedure Verification/Time Out Yes - 09:35 Taken: Start Time: 09:35 Pain Control: Other : benzocaine 20% T Area Debrided (L x W): otal 0.8 (cm) x 0.6 (cm) = 0.48 (cm) Tissue and other material debrided: Viable, Non-Viable, Slough, Slough Level: Non-Viable Tissue Debridement Description: Selective/Open Wound Instrument: Curette Bleeding: Minimum Hemostasis Achieved: Pressure Procedural Pain:  0 Post Procedural Pain: 0 Response to Treatment: Procedure was tolerated well Level of Consciousness (Post- Awake and Alert procedure): Post Debridement Measurements of Total Wound Length: (cm) 0.8 Width: (cm) 0.6 Depth: (cm) 0.1 Volume: (cm) 0.038 Character of Wound/Ulcer Post Debridement: Improved Severity of Tissue Post Debridement: Fat layer exposed Post Procedure Diagnosis Same as Pre-procedure Electronic Signature(s) Signed: 06/07/2021 2:37:03 PM By: Fredirick Maudlin MD FACS Signed: 06/07/2021 4:55:42 PM By: Adline Peals Entered By: Adline Peals on 06/07/2021 09:36:31 -------------------------------------------------------------------------------- HPI Details Patient Name: Date of Service: [name] [name] [facility] THA N L. 06/07/2021 9:15 A M Medical Record Number: [id number] Patient Account Number: [id number] Date of Birth/Sex: Treating RN: Mar 08, 1955 (66 y.o. M) Primary Care Provider: [name], Alyssa Other Clinician: Referring Provider: Treating Provider/Extender: Orland Jarred in Treatment: 84 History of Present Illness HPI Description: ADMISSION 07/07/2020 This is a 66 year old man who is a type II diabetic. I have reviewed this in epic. It appeared that his diabetes was diagnosed in 2009 at which time he was on metformin. He was followed for a period of time by an endocrinologist with [facility] to always labeled him as a type II diabetic. Recently in epic there is been a switch to type 1 diabetes. If there were defining antibody test done I do not see them and I think the totality of the evidence would suggest that he is a type II diabetic. His recent problem started in May when he fell down 17 stairs. He suffered a scapular fracture he was hospitalized at [facility] for 9 days and then discharged to [facility]. His mother who is present said that most of these wounds seem to develop surrounding the discharge from hospital or the stay at [facility]. He  has an area on his lower sacrum which appears currently to be a stage II but per their description this is actually improved quite a bit. He has an area on the left lateral foot at roughly the base of the fifth metatarsal,  the left Achilles heel and the right fifth metatarsal head laterally they have been using topical antibiotics. Seen by primary care yesterday and placed on Keflex The patient had arterial studies on 05/05/2020. I wondered whether this had something to do with wounds on his feet at that time but I have not been able to determine this. At that time his ABI on the right was 1.09 with a TBI of 0.65 on the left 0.88 with a TBI of 0.44. Waveforms were triphasic on the right monophasic on the left. He likely has significant PAD on the left Past medical history includes type 2 diabetes poorly controlled last hemoglobin A1c at 12.7 in May, hypertension, hyperlipidemia, atrial fib on Eliquis, coronary artery disease status post CABG, left scapular fracture in May of this year, AAA repair. Bilateral leg weakness ABIs were not done in our clinic as they were just done in May of this year at [facility] 7/14; patient arrives back in clinic he has areas on the left Achilles heel, base of the left fifth metatarsal, right lateral fifth metatarsal. The area he had over the coccyx appears to be healed but this area remains vulnerable. We have been using Iodoflex. I rereviewed his ABIs which were done in May. I do not know exactly what prompted these test however they do show a TBI of 0.44 on the left and monophasic waveforms. 7/21; the patient's wounds are about the same. Certainly no healing and the nurses noted some green drainage on the right fifth metatarsal head, left Achilles a little larger. We have been using silver alginate really without any improvement. We did get an appointment with vascular surgery however its that it did the end of August and they seem to want to repeat the  noninvasive test that they have already done in May. I am not sure what the issue here is. We advised the patient's sister to call and point to the idea that they have already had noninvasive studies. The real question I have is does this patient need an angiogram. He is a diabetic and it is possible that the ABIs are falsely elevated because of medial calcification. At least on the left his TBI was abnormal 08/08/2020 upon evaluation today patient appears to be doing about the same in regard to his wounds. He still waiting the appointment with vascular. This is something that hopefully should be undertaken shortly. The 29th of this month is when he is actually scheduled although they have put in a call to move this up if at all possible. Fortunately there does not appear to be any signs of active infection at this time. No fevers, chills, nausea, vomiting, or diarrhea. 8/18; appointment with Dr. Trula Slade on 8/29. The patient has wounds on the left Achilles heel, left lateral foot and the right lateral fifth metatarsal head. We have been using Santyl under Hydrofera Blue. The surface of especially the left heel looks better to me than what I remember. 9/1; since the patient was last here he underwent angiography by Dr. Trula Slade he also underwent noninvasive studies first showing a ABI of of 0.64 with monophasic waveforms. His TBI was 0.35 with a left great toe pressure of 46 this is about the same pattern as we saw previously. The left was a lot worse than the right. He underwent an angiogram on 08/29/2020. He had multiple 80% peroneal artery stenosis which was his dominant vessel which was treated with angioplasty with no residual stenosis. His right posterior tibial  artery was occluded. The anterior tibial artery was also occluded. An attempt was made at recanalization of the anterior tibial artery however this was unsuccessful. Noted that the peroneal artery reconstitutes the posterior tibial artery and  the anterior tibial artery creating the plantar arch. The patient has areas on the Achilles left heel left lateral foot and right lateral fifth metatarsal head we have been using Santyl and Hydrofera Blue 9/22; since the patient was last here he went for repeat vascular studies. On the right his ABI was 0.94 with biphasic waveforms TBI of 0.73 with a great toe pressure of 75. On the left which is the problematic wound his ABI was 1.08 but with biphasic waveforms at the PTA. Biphasic waveforms at the dorsalis pedis his TBI was 0.63 with a great toe pressure of 65. On the left however this does represent a fairly marked improvement. We have been using Santyl and Hydrofera Blue 9/29 left Achilles area. Still is having necrotic surface. Bleeds very easily as the patient is on a combination of Eliquis aspirin and Plavix just stopped yesterday. I am not going to do any debridement until next week. We are using Santyl and Hydrofera Blue. His arterial status on the right was actually quite good. The area on the fifth metatarsal head needs additional debridement. A total contact cast in this area is not out of the question. He does describe claudication I believe with minimal activity. He is not very active X-ray we ordered last week is still pending in terms of the report 10/6 left Achilles area. In terms of the necrotic surface this is quite a bit better. There is still some debris in the posterior part of the wound however this looks better. Anteriorly the wound appears to be epithelialized there is still a divot in the middle of this but this does not probe to deep tissue. The area on the right fifth metatarsal head laterally really is not making as much progress as I would have liked. Still not completely a viable surface. Both the patient and his wife agree that he is not an external rotator in terms of ambulation [an outie]. We have been using Santyl and Hydrofera Blue for a prolonged period. On the  left Achilles which looked like it is worse wound this seems to be making some progress not so much on the right foot. The patient and his wife state that they are rigorous about offloading both of these areas He was treated with angioplasty as I believe on the right peroneal artery that had multiple stenosis. This is a dominant vessel the anterior tibial and posterior tibial arteries are occluded however the foot is fed via collaterals from the peroneal. From my notes I am not able to understand the vascular supply on the left I will have to review this. 10/13; left Achilles top part of this looks healthy granulation in the bottom part of this wound again completely necrotic requiring debridement. The area on the right lateral fifth met head also requires debridement. The majority of this is epithelialized however he she there is a refractory part to this. 10/20; left Achilles 100% better looking wound surface. Necrotic upper 50% seems to have resolved at least by inspection under illumination. Culture I did of this area last week postdebridement showed Proteus and Citrobacter. Both sensitive to quinolones and I put him on ciprofloxacin. We have been using silver alginate on the wound on the heel and Santyl and Hydrofera Blue on the right fifth met  head. 10/27; left Achilles is measuring much smaller we are using topical gentamicin under silver alginate. Right fifth met head still fibrinous debris on the surface we are using Santyl and Hydrofera Blue. Patient lost his brother suddenly last week he was unable to keep his vascular surgery appointment. 11/3; left Achilles not as viable surface as I might like. Some hypergranulation. Right fifth metatarsal head still completely nonviable surface. It turns out that she was not using topical gentamicin on the left heel [mother] they have been using Santyl and Hydrofera Blue on the right fifth metatarsal head. The patient did have noninvasive vascular  studies on 10/ 28/22. He had triphasic waveforms up to the level of the proximal PTA and then a 50 to 74% stenosis in the mid PTA. Peroneal artery was triphasic as was the anterior tibial. He follows up with Dr. Trula Slade on 11/7. 11/17; since the patient was last here he saw Dr. Trula Slade and went for ANGIOGRAPHY on 11/15/2020. He has wounds on the left Achilles heel and the right lateral fifth metatarsal head. He has known PAD and had previous revascularization in August. On this occasion on the right his right common femoral, profundofemoral and superficial femoral and popliteal arteries were widely patent the dominant vessel is the posterior tibial which had several areas of stenosis greater than 80% he went underwent angioplasty in this area. He had no interventions on the left however he is left common femoral, profundofemoral and superficial femoral arteries were widely patent as well as the popliteal he has recurrent stenosis greater than 50% within the peroneal artery which is a single- vessel runoff. The thought was that he may require further angiography and reangioplasty on the left peroneal artery. Overall the patient has severe PAD he has undergone angioplasty of the right posterior tibial artery. He arrives in clinic today with the Achilles heel area on the left surprisingly better with marked improvement in the surface area on the right not nearly as good but stable 12/8; patient has follow-up arterial Doppler studies on 12/[facility] at the [facility]. He is now being revascularized on both sides. Fortunately on the right Achilles area this is just about closed slight scabbing/eschar. I did not remove this today. The area on the right fifth met head looked better after last week's debridement we have been using Santyl and Hydrofera Blue 01/05/21 the patient has not been here in almost a month. He was supposed to have follow-up Doppler studies on 12/19 look these up. He has been revascularized  on both sides The left Achilles area is closed he does not need to dress this but will need to protect this area on an ongoing basis likely indefinitely. He still has the area on the right fifth plantar metatarsal head. The area is measuring smaller he has a rim of epithelialization. The granulation tissue still a bit gelatinous for my liking 1/26; The left Achilles remains closed. Plantar right fifth metatarsal head is smaller and appears to be healthy. He has not followed up with vascular surgery and does not seem particularly interested. He did not get his follow-up arterial Dopplers. I explained to him that angioplasty of the small arteries in his leg is a limb saving procedure but possibility of restenosis is reasonably high and he should follow-up with follow-up noninvasive studies. Frankly he did not seem interested today 03/30/2021: The patient has not been seen here since the end of January. He never followed up with vascular surgery and today states that he  refuses to be seen at [facility] due to his dissatisfaction with the fact that his arteries seem to restenose. The wound on his left Achilles is still closed. The plantar right fifth metatarsal head is quite a bit bigger than it was at his last visit. He is accompanied by his mother who reports that it had gotten down to a very small size but then she went out of town for a week and during that time it enlarged to its current dimensions. The patient is wearing the functional equivalent of house slippers for shoes and is not making any effort to offload. He refuses to wear a forefoot offloading sandal due to instability on his feet and says that when a total contact cast was attempted, it was "a total disaster" and he is unwilling to consider this again. He has been in PolyMem silver since at least the beginning of January. 04/26/2021: The patient is here today after about a month. He has been using Hydrofera Blue to his wound and today it is  markedly improved. It is small and clean. He is scheduled to see Dr. Gwenlyn Found in interventional cardiology tomorrow. 05/24/2021: Once again, the patient returns after about a month. I had referred him to Dr. Gwenlyn Found and the patient walked out from that appointment. Apparently there was some confusion over why he was seeing the cardiologist for his peripheral arterial disease. He saw his primary cardiologist, Dr. Davina Poke, last week and has been re-referred to see Dr. Gwenlyn Found, the appointment is tomorrow. Basically the wound is a little bit bigger today but clean without any slough accumulation. The periwound callus that has accumulated is completely macerated. I thought he had been in [facility], but the dressing that had been removed and discarded today looks more like PolyMem Ag. 05/31/2021: The patient decided not to see Dr. Gwenlyn Found because he feels like anytime he has a vascular intervention, the arteries just closed up again and he does not want to do this anymore. He does not really grasp the nature of the disease process. The wound as a bit smaller today and very clean. No accumulation of periwound callus. We have been using Hydrofera Blue. 06/07/2021: The wound looks good today. It is a little bit smaller without any significant periwound callus accumulation. The surface is clean with good granulation tissue. Electronic Signature(s) Signed: 06/07/2021 9:57:08 AM By: Fredirick Maudlin MD FACS Entered By: Fredirick Maudlin on 06/07/2021 09:57:08 -------------------------------------------------------------------------------- Physical Exam Details Patient Name: Date of Service: [name] [name] [facility]NA THA N L. 06/07/2021 9:15 A M Medical Record Number: [id number] Patient Account Number: [id number] Date of Birth/Sex: Treating RN: [birth year] (66 y.o. M) Primary Care Provider: [name], Alyssa Other Clinician: Referring Provider: Treating Provider/Extender: Fredirick Maudlin [name], Alyssa Weeks in Treatment:  60 Constitutional . Slightly bradycardic, asymptomatic.. . . No acute distress.Marland Kitchen Respiratory . Notes 06/07/2021: The wound looks good today. It is a little bit smaller without any significant periwound callus accumulation. The surface is clean with good granulation tissue. Electronic Signature(s) Signed: 06/07/2021 9:57:45 AM By: Fredirick Maudlin MD FACS Entered By: Fredirick Maudlin on 06/07/2021 09:57:45 -------------------------------------------------------------------------------- Physician Orders Details Patient Name: Date of Service: [name] [name] HNA THA N L. 06/07/2021 9:15 A M Medical Record Number: [id number] Patient Account Number: [id number] Date of Birth/Sex: Treating RN: [birth year] (66 y.o. [name] Primary Care Provider: [name], Alyssa Other Clinician: Referring Provider: Treating Provider/Extender: Orland Jarred in Treatment: 16 Verbal / Phone Orders: No Diagnosis Coding ICD-10 Coding Code Description L97.511  Non-pressure chronic ulcer of other part of right foot limited to breakdown of skin E11.621 Type 2 diabetes mellitus with foot ulcer E11.51 Type 2 diabetes mellitus with diabetic peripheral angiopathy without gangrene E11.42 Type 2 diabetes mellitus with diabetic polyneuropathy Follow-up Appointments ppointment in 2 weeks. - Dr. Celine Ahr - Room 2 - 6/20 at 10:00 AM Return A Bathing/ Shower/ Hygiene May shower and wash wound with soap and water. - with dressing changes only. Edema Control - Lymphedema / SCD / Other Elevate legs to the level of the heart or above for 30 minutes daily and/or when sitting, a frequency of: - 3-4 times a day throughout the day. Moisturize legs daily. - every night before bed. *** closely monitor feet every night before bed. Off-Loading Other: - float heels while resting in bed or chair. Use a pillow or rolled sheet under legs to offload the heels. Ensure no pressure to right lateral foot. Home  Health No change in wound care orders this week; continue Home Health for wound care. May utilize formulary equivalent dressing for wound treatment orders unless otherwise specified. Other Home Health Orders/Instructions: - [facility] home health weekly changes and all other days family to change. Wound Treatment Wound #3 - Foot Wound Laterality: Right, Lateral Cleanser: Soap and Water Every Other Day/30 Days Discharge Instructions: May shower and wash wound with dial antibacterial soap and water prior to dressing change. Cleanser: Wound Cleanser Every Other Day/30 Days Discharge Instructions: Cleanse the wound with wound cleanser prior to applying a clean dressing using gauze sponges, not tissue or cotton balls. Cleanser: [place]ram Ancillary Kit - 15 Day Supply (Generic) Every Other Day/30 Days Discharge Instructions: Use supplies as instructed; Kit contains: (15) Saline Bullets; (15) 3x3 Gauze; 15 pr Gloves Prim Dressing: Hydrofera Blue Classic Foam, 2x2 in (Generic) Every Other Day/30 Days ary Discharge Instructions: Moisten with saline prior to applying to wound bed Secondary Dressing: Bordered Gauze, 4x4 in (Generic) Every Other Day/30 Days Discharge Instructions: Apply over primary dressing as directed. Electronic Signature(s) Signed: 06/07/2021 2:37:03 PM By: Fredirick Maudlin MD FACS Signed: 06/07/2021 4:55:42 PM By: Sabas Sous By: Adline Peals on 06/07/2021 11:09:09 -------------------------------------------------------------------------------- Problem List Details Patient Name: Date of Service: [name] [name] HNA THA N L. 06/07/2021 9:15 A M Medical Record Number: [id number] Patient Account Number: [id number] Date of Birth/Sex: Treating RN: [birth year] (66 y.o. M) Primary Care Provider: [name], Alyssa Other Clinician: Referring Provider: Treating Provider/Extender: Orland Jarred in Treatment: 34 Active Problems ICD-10 Encounter Code  Description Active Date MDM Diagnosis L97.511 Non-pressure chronic ulcer of other part of right foot limited to breakdown of 07/07/2020 No Yes skin E11.621 Type 2 diabetes mellitus with foot ulcer 07/07/2020 No Yes E11.51 Type 2 diabetes mellitus with diabetic peripheral angiopathy without gangrene 09/01/2020 No Yes E11.42 Type 2 diabetes mellitus with diabetic polyneuropathy 07/07/2020 No Yes Inactive Problems ICD-10 Code Description Active Date Inactive Date L97.528 Non-pressure chronic ulcer of other part of left foot with other specified severity 07/07/2020 07/07/2020 L89.152 Pressure ulcer of sacral region, stage 2 07/07/2020 07/07/2020 W40.973 Pressure ulcer of left heel, stage 2 07/07/2020 07/07/2020 Resolved Problems Electronic Signature(s) Signed: 06/07/2021 9:56:16 AM By: Fredirick Maudlin MD FACS Entered By: Fredirick Maudlin on 06/07/2021 09:56:16 -------------------------------------------------------------------------------- Progress Note Details Patient Name: Date of Service: [name] [name] HNA THA N L. 06/07/2021 9:15 A M Medical Record Number: [id number] Patient Account Number: [id number] Date of Birth/Sex: Treating RN: 1955/10/25 (66 y.o. M) Primary Care Provider: [name], Alyssa Other Clinician: Referring Provider:  Treating Provider/Extender: Fredirick Maudlin [name], Alyssa Weeks in Treatment: 21 Subjective Chief Complaint Information obtained from Patient 07/07/2020; patient is in clinic for today for review of pressure ulcers on his coccyx and left heel and diabetic foot ulcers bilaterally. History of Present Illness (HPI) ADMISSION 07/07/2020 This is a 66 year old man who is a type II diabetic. I have reviewed this in epic. It appeared that his diabetes was diagnosed in 2009 at which time he was on metformin. He was followed for a period of time by an endocrinologist with [facility] to always labeled him as a type II diabetic. Recently in epic there is been a switch to type 1 diabetes. If  there were defining antibody test done I do not see them and I think the totality of the evidence would suggest that he is a type II diabetic. His recent problem started in May when he fell down 17 stairs. He suffered a scapular fracture he was hospitalized at [facility] for 9 days and then discharged to [name]. His mother who is present said that most of these wounds seem to develop surrounding the discharge from hospital or the stay at [facility]. He has an area on his lower sacrum which appears currently to be a stage II but per their description this is actually improved quite a bit. He has an area on the left lateral foot at roughly the base of the fifth metatarsal, the left Achilles heel and the right fifth metatarsal head laterally they have been using topical antibiotics. Seen by primary care yesterday and placed on Keflex The patient had arterial studies on 05/05/2020. I wondered whether this had something to do with wounds on his feet at that time but I have not been able to determine this. At that time his ABI on the right was 1.09 with a TBI of 0.65 on the left 0.88 with a TBI of 0.44. Waveforms were triphasic on the right monophasic on the left. He likely has significant PAD on the left Past medical history includes type 2 diabetes poorly controlled last hemoglobin A1c at 12.7 in May, hypertension, hyperlipidemia, atrial fib on Eliquis, coronary artery disease status post CABG, left scapular fracture in May of this year, AAA repair. Bilateral leg weakness ABIs were not done in our clinic as they were just done in May of this year at [facility] 7/14; patient arrives back in clinic he has areas on the left Achilles heel, base of the left fifth metatarsal, right lateral fifth metatarsal. The area he had over the coccyx appears to be healed but this area remains vulnerable. We have been using Iodoflex. I rereviewed his ABIs which were done in May. I do not know exactly what prompted  these test however they do show a TBI of 0.44 on the left and monophasic waveforms. 7/21; the patient's wounds are about the same. Certainly no healing and the nurses noted some green drainage on the right fifth metatarsal head, left Achilles a little larger. We have been using silver alginate really without any improvement. We did get an appointment with vascular surgery however its that it did the end of August and they seem to want to repeat the noninvasive test that they have already done in May. I am not sure what the issue here is. We advised the patient's sister to call and point to the idea that they have already had noninvasive studies. The real question I have is does this patient need an angiogram. He is a  diabetic and it is possible that the ABIs are falsely elevated because of medial calcification. At least on the left his TBI was abnormal 08/08/2020 upon evaluation today patient appears to be doing about the same in regard to his wounds. He still waiting the appointment with vascular. This is something that hopefully should be undertaken shortly. The 29th of this month is when he is actually scheduled although they have put in a call to move this up if at all possible. Fortunately there does not appear to be any signs of active infection at this time. No fevers, chills, nausea, vomiting, or diarrhea. 8/18; appointment with Dr. Trula Slade on 8/29. The patient has wounds on the left Achilles heel, left lateral foot and the right lateral fifth metatarsal head. We have been using Santyl under Hydrofera Blue. The surface of especially the left heel looks better to me than what I remember. 9/1; since the patient was last here he underwent angiography by Dr. Trula Slade he also underwent noninvasive studies first showing a ABI of of 0.64 with monophasic waveforms. His TBI was 0.35 with a left great toe pressure of 46 this is about the same pattern as we saw previously. The left was a lot worse than the  right. He underwent an angiogram on 08/29/2020. He had multiple 80% peroneal artery stenosis which was his dominant vessel which was treated with angioplasty with no residual stenosis. 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 The anterior tibial artery was also occluded. An attempt was made at recanalization of the anterior tibial artery however this was unsuccessful. Noted that the peroneal artery reconstitutes the posterior tibial artery and the anterior tibial artery creating the plantar arch. The patient has areas on the Achilles left heel left lateral foot and right lateral fifth metatarsal head we have been using Santyl and Hydrofera Blue 9/22; since the patient was last here he went for repeat vascular studies. On the right his ABI was 0.94 with biphasic waveforms TBI of 0.73 with a great toe pressure of 75. On the left which is the problematic wound his ABI was 1.08 but with biphasic waveforms at the PTA. Biphasic waveforms at the dorsalis pedis his TBI was 0.63 with a great toe pressure of 65. On the left however this does represent a fairly marked improvement. We have been using Santyl and Hydrofera Blue 9/29 left Achilles area. Still is having necrotic surface. Bleeds very easily as the patient is on a combination of Eliquis aspirin and Plavix just stopped yesterday. I am not going to do any debridement until next week. We are using Santyl and Hydrofera Blue. His arterial status on the right was actually quite good. The area on the fifth metatarsal head needs additional debridement. A total contact cast in this area is not out of the question. He does describe claudication I believe with minimal activity. He is not very active X-ray we ordered last week is still pending in terms of the report 10/6 left Achilles area. In terms of the necrotic surface this is quite a bit better. There is still some debris in the posterior part of the wound however this looks better. Anteriorly the wound  appears to be epithelialized there is still a divot in the middle of this but this does not probe to deep tissue. The area on the right fifth metatarsal head laterally really is not making as much progress as I would have liked. Still not completely a viable surface. Both the patient and his wife agree  that he is not an external rotator in terms of ambulation [an outie]. We have been using Santyl and Hydrofera Blue for a prolonged period. On the left Achilles which looked like it is worse wound this seems to be making some progress not so much on the right foot. The patient and his wife state that they are rigorous about offloading both of these areas He was treated with angioplasty as I believe on the right peroneal artery that had multiple stenosis. This is a dominant vessel the anterior tibial and posterior tibial arteries are occluded however the foot is fed via collaterals from the peroneal. From my notes I am not able to understand the vascular supply on the left I will have to review this. 10/13; left Achilles top part of this looks healthy granulation in the bottom part of this wound again completely necrotic requiring debridement. oo The area on the right lateral fifth met head also requires debridement. The majority of this is epithelialized however he she there is a refractory part to this. 10/20; left Achilles 100% better looking wound surface. Necrotic upper 50% seems to have resolved at least by inspection under illumination. Culture I did of this area last week postdebridement showed Proteus and Citrobacter. Both sensitive to quinolones and I put him on ciprofloxacin. We have been using silver alginate on the wound on the heel and Santyl and Hydrofera Blue on the right fifth met head. 10/27; left Achilles is measuring much smaller we are using topical gentamicin under silver alginate. Right fifth met head still fibrinous debris on the surface we are using Santyl and Hydrofera  Blue. Patient lost his brother suddenly last week he was unable to keep his vascular surgery appointment. 11/3; left Achilles not as viable surface as I might like. Some hypergranulation. Right fifth metatarsal head still completely nonviable surface. It turns out that she was not using topical gentamicin on the left heel [mother] they have been using Santyl and Hydrofera Blue on the right fifth metatarsal head. The patient did have noninvasive vascular studies on 10/ 28/22. He had triphasic waveforms up to the level of the proximal PTA and then a 50 to 74% stenosis in the mid PTA. Peroneal artery was triphasic as was the anterior tibial. He follows up with Dr. Trula Slade on 11/7. 11/17; since the patient was last here he saw Dr. Trula Slade and went for ANGIOGRAPHY on 11/15/2020. He has wounds on the left Achilles heel and the right lateral fifth metatarsal head. He has known PAD and had previous revascularization in August. On this occasion on the right his right common femoral, profundofemoral and superficial femoral and popliteal arteries were widely patent the dominant vessel is the posterior tibial which had several areas of stenosis greater than 80% he went underwent angioplasty in this area. He had no interventions on the left however he is left common femoral, profundofemoral and superficial femoral arteries were widely patent as well as the popliteal he has recurrent stenosis greater than 50% within the peroneal artery which is a single- vessel runoff. The thought was that he may require further angiography and reangioplasty on the left peroneal artery. Overall the patient has severe PAD he has undergone angioplasty of the right posterior tibial artery. He arrives in clinic today with the Achilles heel area on the left surprisingly better with marked improvement in the surface area on the right not nearly as good but stable 12/8; patient has follow-up arterial Doppler studies on 12/19 at the [facility]. He is now being revascularized on both sides. Fortunately on the right Achilles area this is just about closed slight scabbing/eschar. I did not remove this today. The area on the right fifth met head looked better after last week's debridement we have been using Santyl and Hydrofera Blue 01/05/21 the patient has not been here in almost a month. He was supposed to have follow-up Doppler studies on 12/19 look these up. He has been revascularized on both sides The left Achilles area is closed he does not need to dress this but will need to protect this area on an ongoing basis likely indefinitely. He still has the area on the right fifth plantar metatarsal head. The area is measuring smaller he has a rim of epithelialization. The granulation tissue still a bit gelatinous for my liking 1/26; The left Achilles remains closed. Plantar right fifth metatarsal head is smaller and appears to be healthy. He has not followed up with vascular surgery and does not seem particularly interested. He did not get his follow-up arterial Dopplers. I explained to him that angioplasty of the small arteries in his leg is a limb saving procedure but possibility of restenosis is reasonably high and he should follow-up with follow-up noninvasive studies. Frankly he did not seem interested today 03/30/2021: The patient has not been seen here since the end of January. He never followed up with vascular surgery and today states that he refuses to be seen at [facility] due to his dissatisfaction with the fact that his arteries seem to restenose. The wound on his left Achilles is still closed. The plantar right fifth metatarsal head is quite a bit bigger than it was at his last visit. He is accompanied by his mother who reports that it had gotten down to a very small size but then she went out of town for a week and during that time it enlarged to its current dimensions. The patient is wearing the functional equivalent of  house slippers for shoes and is not making any effort to offload. He refuses to wear a forefoot offloading sandal due to instability on his feet and says that when a total contact cast was attempted, it was "a total disaster" and he is unwilling to consider this again. He has been in PolyMem silver since at least the beginning of January. 04/26/2021: The patient is here today after about a month. He has been using Hydrofera Blue to his wound and today it is markedly improved. It is small and clean. He is scheduled to see Dr. Gwenlyn Found in interventional cardiology tomorrow. 05/24/2021: Once again, the patient returns after about a month. I had referred him to Dr. Gwenlyn Found and the patient walked out from that appointment. Apparently there was some confusion over why he was seeing the cardiologist for his peripheral arterial disease. He saw his primary cardiologist, Dr. Davina Poke, last week and has been re-referred to see Dr. Gwenlyn Found, the appointment is tomorrow. Basically the wound is a little bit bigger today but clean without any slough accumulation. The periwound callus that has accumulated is completely macerated. I thought he had been in [facility], but the dressing that had been removed and discarded today looks more like PolyMem Ag. 05/31/2021: The patient decided not to see Dr. Gwenlyn Found because he feels like anytime he has a vascular intervention, the arteries just closed up again and he does not want to do this anymore. He does not really grasp the nature of the disease process. The wound as a  bit smaller today and very clean. No accumulation of periwound callus. We have been using Hydrofera Blue. 06/07/2021: The wound looks good today. It is a little bit smaller without any significant periwound callus accumulation. The surface is clean with good granulation tissue. Patient History Information obtained from Patient. Family History Cancer - Paternal Grandparents,Father, Heart Disease - Father,  Hypertension - Father, No family history of Diabetes, Hereditary Spherocytosis, Kidney Disease, Lung Disease, Seizures, Stroke, Thyroid Problems, Tuberculosis. Social History Current some day smoker - Landscape architect, Marital Status - Divorced, Alcohol Use - Rarely, Drug Use - No History, Caffeine Use - Daily. Medical History Cardiovascular Patient has history of Coronary Artery Disease, Hypertension Endocrine Patient has history of Type II Diabetes Musculoskeletal Patient has history of Osteoarthritis Neurologic Patient has history of Neuropathy Objective Constitutional Slightly bradycardic, asymptomatic.Marland Kitchen No acute distress.. Vitals Time Taken: 9:22 AM, Height: 74 in, Weight: 151 lbs, BMI: 19.4, Temperature: 98.4 F, Pulse: 59 bpm, Respiratory Rate: 18 breaths/min, Blood Pressure: 126/69 mmHg, Capillary Blood Glucose: 138 mg/dl. General Notes: 06/07/2021: The wound looks good today. It is a little bit smaller without any significant periwound callus accumulation. The surface is clean with good granulation tissue. Integumentary (Hair, Skin) Wound #3 status is Open. Original cause of wound was Gradually Appeared. The date acquired was: 06/21/2020. The wound has been in treatment 47 weeks. The wound is located on the Right,Lateral Foot. The wound measures 0.8cm length x 0.6cm width x 0.1cm depth; 0.377cm^2 area and 0.038cm^3 volume. There is Fat Layer (Subcutaneous Tissue) exposed. There is no tunneling or undermining noted. There is a medium amount of serosanguineous drainage noted. The wound margin is flat and intact. There is large (67-100%) red, pink granulation within the wound bed. There is no necrotic tissue within the wound bed. Assessment Active Problems ICD-10 Non-pressure chronic ulcer of other part of right foot limited to breakdown of skin Type 2 diabetes mellitus with foot ulcer Type 2 diabetes mellitus with diabetic peripheral angiopathy without gangrene Type 2 diabetes mellitus with  diabetic polyneuropathy Procedures Wound #3 Pre-procedure diagnosis of Wound #3 is a Diabetic Wound/Ulcer of the Lower Extremity located on the Right,Lateral Foot .Severity of Tissue Pre Debridement is: Fat layer exposed. There was a Selective/Open Wound Non-Viable Tissue Debridement with a total area of 0.48 sq cm performed by Fredirick Maudlin, MD. With the following instrument(s): Curette to remove Viable and Non-Viable tissue/material. Material removed includes [facility] after achieving pain control using Other (benzocaine 20%). No specimens were taken. A time out was conducted at 09:35, prior to the start of the procedure. A Minimum amount of bleeding was controlled with Pressure. The procedure was tolerated well with a pain level of 0 throughout and a pain level of 0 following the procedure. Post Debridement Measurements: 0.8cm length x 0.6cm width x 0.1cm depth; 0.038cm^3 volume. Character of Wound/Ulcer Post Debridement is improved. Severity of Tissue Post Debridement is: Fat layer exposed. Post procedure Diagnosis Wound #3: Same as Pre-Procedure Plan Follow-up Appointments: Return Appointment in 2 weeks. - Dr. Celine Ahr - Room 2 - 6/20 at 10:00 AM Bathing/ Shower/ Hygiene: May shower and wash wound with soap and water. - with dressing changes only. Edema Control - Lymphedema / SCD / Other: Elevate legs to the level of the heart or above for 30 minutes daily and/or when sitting, a frequency of: - 3-4 times a day throughout the day. Moisturize legs daily. - every night before bed. *** closely monitor feet every night before bed. Off-Loading: Other: - float heels  while resting in bed or chair. Use a pillow or rolled sheet under legs to offload the heels. Ensure no pressure to right lateral foot. Home Health: New wound care orders this week; continue Home Health for wound care. May utilize formulary equivalent dressing for wound treatment orders unless otherwise specified. Other Home Health  Orders/Instructions: - [facility] home health weekly changes and all other days family to change. WOUND #3: - Foot Wound Laterality: Right, Lateral Cleanser: Soap and Water Every Other Day/30 Days Discharge Instructions: May shower and wash wound with dial antibacterial soap and water prior to dressing change. Cleanser: Wound Cleanser Every Other Day/30 Days Discharge Instructions: Cleanse the wound with wound cleanser prior to applying a clean dressing using gauze sponges, not tissue or cotton balls. Cleanser: [place] Ancillary Kit - 15 Day Supply (Generic) Every Other Day/30 Days Discharge Instructions: Use supplies as instructed; Kit contains: (15) Saline Bullets; (15) 3x3 Gauze; 15 pr Gloves Prim Dressing: Hydrofera Blue Classic Foam, 2x2 in (Generic) Every Other Day/30 Days ary Discharge Instructions: Moisten with saline prior to applying to wound bed Secondary Dressing: Bordered Gauze, 4x4 in (Generic) Every Other Day/30 Days Discharge Instructions: Apply over primary dressing as directed. 06/07/2021: The wound looks good today. It is a little bit smaller without any significant periwound callus accumulation. The surface is clean with good granulation tissue. I used a curette to debride biofilm and slough from the wound surface. We will continue using Hydrofera Blue. He will not have transportation next week so I will see him in 2 weeks' time. Electronic Signature(s) Signed: 06/07/2021 9:58:22 AM By: Fredirick Maudlin MD FACS Entered By: Fredirick Maudlin on 06/07/2021 09:58:21 -------------------------------------------------------------------------------- HxROS Details Patient Name: Date of Service: [name] [name] HNA THA N L. 06/07/2021 9:15 A M Medical Record Number: [id number] Patient Account Number: [id number] Date of Birth/Sex: Treating RN: 1955-06-16 (66 y.o. M) Primary Care Provider: [name], Alyssa Other Clinician: Referring Provider: Treating Provider/Extender: Orland Jarred in Treatment: 86 Information Obtained From Patient Cardiovascular Medical History: Positive for: Coronary Artery Disease; Hypertension Endocrine Medical History: Positive for: Type II Diabetes Treated with: Insulin Blood sugar tested every day: Yes Tested : CGM Musculoskeletal Medical History: Positive for: Osteoarthritis Neurologic Medical History: Positive for: Neuropathy Immunizations Pneumococcal Vaccine: Received Pneumococcal Vaccination: Yes Received Pneumococcal Vaccination On or After 60th Birthday: Yes Implantable Devices None Family and Social History Cancer: Yes - Paternal Grandparents,Father; Diabetes: No; Heart Disease: Yes - Father; Hereditary Spherocytosis: No; Hypertension: Yes - Father; Kidney Disease: No; Lung Disease: No; Seizures: No; Stroke: No; Thyroid Problems: No; Tuberculosis: No; Current some day smoker - Cigars; Marital Status - Divorced; Alcohol Use: Rarely; Drug Use: No History; Caffeine Use: Daily; Financial Concerns: No; Food, Clothing or Shelter Needs: No; Support System Lacking: No; Transportation Concerns: No Electronic Signature(s) Signed: 06/07/2021 2:37:03 PM By: Fredirick Maudlin MD FACS Entered By: Fredirick Maudlin on 06/07/2021 09:57:16 -------------------------------------------------------------------------------- SuperBill Details Patient Name: Date of Service: [name] [name] HNA THA N L. 06/07/2021 Medical Record Number: [id number] Patient Account Number: [id number] Date of Birth/Sex: Treating RN: Apr 03, 1955 (66 y.o. M) Primary Care Provider: [name], Alyssa Other Clinician: Referring Provider: Treating Provider/Extender: Thana Ates, Alyssa Weeks in Treatment: 47 Diagnosis Coding ICD-10 Codes Code Description L97.511 Non-pressure chronic ulcer of other part of right foot limited to breakdown of skin E11.621 Type 2 diabetes mellitus with foot ulcer E11.51 Type 2 diabetes mellitus with  diabetic peripheral angiopathy without gangrene E11.42 Type 2 diabetes mellitus with diabetic polyneuropathy Facility Procedures CPT4  Code: 32346887 Description: 37308 - DEBRIDE WOUND 1ST 20 SQ CM OR < ICD-10 Diagnosis Description L97.511 Non-pressure chronic ulcer of other part of right foot limited to breakdown of s Modifier: kin Quantity: 1 Physician Procedures : CPT4 Code Description Modifier [id number] - WC PHYS LEVEL 3 - EST PT 25 ICD-10 Diagnosis Description L97.511 Non-pressure chronic ulcer of other part of right foot limited to breakdown of skin E11.621 Type 2 diabetes mellitus with foot ulcer E11.42  Type 2 diabetes mellitus with diabetic polyneuropathy E11.51 Type 2 diabetes mellitus with diabetic peripheral angiopathy without gangrene Quantity: 1 : [id number] - WC PHYS DEBR WO ANESTH 20 SQ CM ICD-10 Diagnosis Description L97.511 Non-pressure chronic ulcer of other part of right foot limited to breakdown of skin Quantity: 1 Electronic Signature(s) Signed: 06/07/2021 9:58:39 AM By: Fredirick Maudlin MD FACS Entered By: Fredirick Maudlin on 06/07/2021 09:58:38

## 2021-06-07 NOTE — Progress Notes (Signed)
MANUEL, DALL ([id number]) Visit Report for 06/07/2021 Arrival Information Details Patient Name: Date of Service: [name] [name] [facility] THA N L. 06/07/2021 9:15 A M Medical Record Number: [id number] Patient Account Number: [id number] Date of Birth/Sex: Treating RN: May 28, 1955 (66 y.o. [name] Primary Care Joshalyn Ancheta: [name], Alyssa Other Clinician: Referring Maye Parkinson: Treating Ayomikun Starling/Extender: Orland Jarred in Treatment: 70 Visit Information History Since Last Visit Added or deleted any medications: No Patient Arrived: Walker Any new allergies or adverse reactions: No Arrival Time: 09:17 Had a fall or experienced change in No Accompanied By: mother activities of daily living that may affect Transfer Assistance: None risk of falls: Patient Identification Verified: Yes Signs or symptoms of abuse/neglect since last visito No Secondary Verification Process Completed: Yes Hospitalized since last visit: No Patient Requires Transmission-Based Precautions: No Implantable device outside of the clinic excluding No Patient Has Alerts: Yes cellular tissue based products placed in the center Patient Alerts: Patient on Blood Thinner since last visit: Has Dressing in Place as Prescribed: Yes Pain Present Now: Yes Electronic Signature(s) Signed: 06/07/2021 4:55:42 PM By: Adline Peals Entered By: Adline Peals on 06/07/2021 09:21:53 -------------------------------------------------------------------------------- Encounter Discharge Information Details Patient Name: Date of Service: [name] [name] [facility] THA N L. 06/07/2021 9:15 A M Medical Record Number: [id number] Patient Account Number: [id number] Date of Birth/Sex: Treating RN: 11-20-1955 (66 y.o. [name] Primary Care Radonna Bracher: [name], Alyssa Other Clinician: Referring Lewanna Petrak: Treating Keelan Pomerleau/Extender: Orland Jarred in Treatment: 72 Encounter Discharge  Information Items Post Procedure Vitals Discharge Condition: Stable Temperature (F): 98.4 Ambulatory Status: Walker Pulse (bpm): 59 Discharge Destination: Home Respiratory Rate (breaths/min): 18 Transportation: Private Auto Blood Pressure (mmHg): 126/69 Accompanied By: mother Schedule Follow-up Appointment: Yes Clinical Summary of Care: Patient Declined Electronic Signature(s) Signed: 06/07/2021 4:55:42 PM By: Adline Peals Entered By: Adline Peals on 06/07/2021 09:45:27 -------------------------------------------------------------------------------- Lower Extremity Assessment Details Patient Name: Date of Service: [name] [name] [facility] THA N L. 06/07/2021 9:15 A M Medical Record Number: [id number] Patient Account Number: [id number] Date of Birth/Sex: Treating RN: 07-27-1955 (66 y.o. [name] Primary Care Navya Timmons: [name], Alyssa Other Clinician: Referring Rowan Blaker: Treating Cambri Plourde/Extender: Fredirick Maudlin [name], Alyssa Weeks in Treatment: 47 Edema Assessment Assessed: [Left: No] [Right: No] Edema: [Left: Ye] [Right: s] Calf Left: Right: Point of Measurement: 34 cm From Medial Instep 33.1 cm Ankle Left: Right: Point of Measurement: 10 cm From Medial Instep 25.7 cm Vascular Assessment Pulses: Dorsalis Pedis Palpable: [Right:Yes] Electronic Signature(s) Signed: 06/07/2021 4:55:42 PM By: Adline Peals Entered By: Adline Peals on 06/07/2021 09:24:23 -------------------------------------------------------------------------------- Multi Wound Chart Details Patient Name: Date of Service: [name] [name] [facility] THA N L. 06/07/2021 9:15 A M Medical Record Number: [id number] Patient Account Number: [id number] Date of Birth/Sex: Treating RN: 03/04/1955 (66 y.o. M) Primary Care Elivia Robotham: [name], Alyssa Other Clinician: Referring Dema Timmons: Treating Carissa Musick/Extender: Thana Ates, Alyssa Weeks in Treatment: 52 Vital Signs Height(in):  74 Capillary Blood Glucose(mg/dl): 138 Weight(lbs): 151 Pulse(bpm): 28 Body Mass Index(BMI): 19.4 Blood Pressure(mmHg): 126/69 Temperature(F): 98.4 Respiratory Rate(breaths/min): 18 Photos: [3:Right, Lateral Foot] [N/A:N/A N/A] Wound Location: [3:Gradually Appeared] [N/A:N/A] Wounding Event: [3:Diabetic Wound/Ulcer of the Lower] [N/A:N/A] Primary Etiology: [3:Extremity Coronary Artery Disease,] [N/A:N/A] Comorbid History: [3:Hypertension, Type II Diabetes, Osteoarthritis, Neuropathy 06/21/2020] [N/A:N/A] Date Acquired: [3:47] [N/A:N/A] Weeks of Treatment: [3:Open] [N/A:N/A] Wound Status: [3:No] [N/A:N/A] Wound Recurrence: [3:0.8x0.6x0.1] [N/A:N/A] Measurements L x W x D (cm) [3:0.377] [N/A:N/A] A (cm) : rea [3:0.038] [N/A:N/A] Volume (cm) : [3:81.20%] [N/A:N/A] % Reduction in A [3:rea:  90.50%] [N/A:N/A] % Reduction in Volume: [3:Grade 2] [N/A:N/A] Classification: [3:Medium] [N/A:N/A] Exudate A mount: [3:Serosanguineous] [N/A:N/A] Exudate Type: [3:red, brown] [N/A:N/A] Exudate Color: [3:Flat and Intact] [N/A:N/A] Wound Margin: [3:Large (67-100%)] [N/A:N/A] Granulation A mount: [3:Red, Pink] [N/A:N/A] Granulation Quality: [3:None Present (0%)] [N/A:N/A] Necrotic A mount: [3:Fat Layer (Subcutaneous Tissue): Yes N/A] Exposed Structures: [3:Fascia: No Tendon: No Muscle: No Joint: No Bone: No Medium (34-66%)] [N/A:N/A] Epithelialization: [3:Debridement - Selective/Open Wound N/A] Debridement: Pre-procedure Verification/Time Out 09:35 [N/A:N/A] Taken: [3:Other] [N/A:N/A] Pain Control: [3:Slough] [N/A:N/A] Tissue Debrided: [3:Non-Viable Tissue] [N/A:N/A] Level: [3:0.48] [N/A:N/A] Debridement A (sq cm): [3:rea Curette] [N/A:N/A] Instrument: [3:Minimum] [N/A:N/A] Bleeding: [3:Pressure] [N/A:N/A] Hemostasis A chieved: [3:0] [N/A:N/A] Procedural Pain: [3:0] [N/A:N/A] Post Procedural Pain: [3:Procedure was tolerated well] [N/A:N/A] Debridement Treatment Response: [3:0.8x0.6x0.1]  [N/A:N/A] Post Debridement Measurements L x W x D (cm) [3:0.038] [N/A:N/A] Post Debridement Volume: (cm) [3:Debridement] [N/A:N/A] Treatment Notes Wound #3 (Foot) Wound Laterality: Right, Lateral Cleanser Soap and Water Discharge Instruction: May shower and wash wound with dial antibacterial soap and water prior to dressing change. Wound Cleanser Discharge Instruction: Cleanse the wound with wound cleanser prior to applying a clean dressing using gauze sponges, not tissue or cotton balls. Byram Ancillary Kit - 15 Day Supply Discharge Instruction: Use supplies as instructed; Kit contains: (15) Saline Bullets; (15) 3x3 Gauze; 15 pr Gloves Peri-Wound Care Topical Primary Dressing Hydrofera Blue Classic Foam, 2x2 in Discharge Instruction: Moisten with saline prior to applying to wound bed Secondary Dressing Bordered Gauze, 4x4 in Discharge Instruction: Apply over primary dressing as directed. Secured With Compression Wrap Compression Stockings Environmental education officer) Signed: 06/07/2021 9:56:24 AM By: Fredirick Maudlin MD FACS Entered By: Fredirick Maudlin on 06/07/2021 09:56:23 -------------------------------------------------------------------------------- Multi-Disciplinary Care Plan Details Patient Name: Date of Service: [name] [name] [facility] THA N L. 06/07/2021 9:15 A M Medical Record Number: [id number] Patient Account Number: [id number] Date of Birth/Sex: Treating RN: 1955/12/22 (66 y.o. [name] Primary Care Maher Shon: [name], Alyssa Other Clinician: Referring Jilliana Burkes: Treating Claritza July/Extender: Orland Jarred in Treatment: [place] reviewed with physician Active Inactive Wound/Skin Impairment Nursing Diagnoses: Knowledge deficit related to ulceration/compromised skin integrity Goals: Patient/caregiver will verbalize understanding of skin care regimen Date Initiated: 07/07/2020 Target Resolution Date:  06/23/2021 Goal Status: Active Interventions: Assess patient/caregiver ability to obtain necessary supplies Assess patient/caregiver ability to perform ulcer/skin care regimen upon admission and as needed Provide education on ulcer and skin care Treatment Activities: Skin care regimen initiated : 07/07/2020 Topical wound management initiated : 07/07/2020 Notes: Electronic Signature(s) Signed: 06/07/2021 4:55:42 PM By: Adline Peals Entered By: Adline Peals on 06/07/2021 09:28:52 -------------------------------------------------------------------------------- Pain Assessment Details Patient Name: Date of Service: [name] [name] [facility] THA N L. 06/07/2021 9:15 A M Medical Record Number: [id number] Patient Account Number: [id number] Date of Birth/Sex: Treating RN: April 25, 1955 (66 y.o. [name] Primary Care Cristen Bredeson: [name], Alyssa Other Clinician: Referring Nickoles Gregori: Treating Elwood Bazinet/Extender: Thana Ates, Alyssa Weeks in Treatment: 70 Active Problems Location of Pain Severity and Description of Pain Patient Has Paino Yes Site Locations Pain Location: Pain Location: Generalized Pain Duration of the Pain. Constant / Intermittento Intermittent Rate the pain. Current Pain Level: 5 Pain Management and Medication Current Pain Management: Electronic Signature(s) Signed: 06/07/2021 4:55:42 PM By: Adline Peals Entered By: Adline Peals on 06/07/2021 09:22:08 -------------------------------------------------------------------------------- Patient/Caregiver Education Details Patient Name: Date of Service: [name] [name] [facility] THA N L. 6/7/2023andnbsp9:15 A M Medical Record Number: [id number] Patient Account Number: [id number] Date of Birth/Gender: Treating RN: 1955-04-01 (66 y.o. Flossie Buffy,  [facility] Primary Care Physician: [name], Alyssa Other Clinician: Referring Physician: Treating Physician/Extender: Orland Jarred in  Treatment: 55 Education Assessment Education Provided To: Patient Education Topics Provided Wound/Skin Impairment: Methods: Explain/Verbal Responses: Reinforcements needed, State content correctly Electronic Signature(s) Signed: 06/07/2021 4:55:42 PM By: Adline Peals Entered By: Adline Peals on 06/07/2021 09:29:03 -------------------------------------------------------------------------------- Wound Assessment Details Patient Name: Date of Service: [name] [name] [facility] THA N L. 06/07/2021 9:15 A M Medical Record Number: [id number] Patient Account Number: [id number] Date of Birth/Sex: Treating RN: Dec 07, 1955 (66 y.o. [name] Primary Care Ivon Roedel: [name], Alyssa Other Clinician: Referring Finis Hendricksen: Treating Terecia Plaut/Extender: Fredirick Maudlin [name], Alyssa Weeks in Treatment: 47 Wound Status Wound Number: 3 Primary Diabetic Wound/Ulcer of the Lower Extremity Etiology: Wound Location: Right, Lateral Foot Wound Open Wounding Event: Gradually Appeared Status: Date Acquired: 06/21/2020 Comorbid Coronary Artery Disease, Hypertension, Type II Diabetes, Weeks Of Treatment: 47 History: Osteoarthritis, Neuropathy Clustered Wound: No Photos Wound Measurements Length: (cm) 0.8 Width: (cm) 0.6 Depth: (cm) 0.1 Area: (cm) 0.377 Volume: (cm) 0.038 % Reduction in Area: 81.2% % Reduction in Volume: 90.5% Epithelialization: Medium (34-66%) Tunneling: No Undermining: No Wound Description Classification: Grade 2 Wound Margin: Flat and Intact Exudate Amount: Medium Exudate Type: Serosanguineous Exudate Color: red, brown Foul Odor After Cleansing: No Slough/Fibrino No Wound Bed Granulation Amount: Large (67-100%) Exposed Structure Granulation Quality: Red, Pink Fascia Exposed: No Necrotic Amount: None Present (0%) Fat Layer (Subcutaneous Tissue) Exposed: Yes Tendon Exposed: No Muscle Exposed: No Joint Exposed: No Bone Exposed: No Treatment Notes Wound  #3 (Foot) Wound Laterality: Right, Lateral Cleanser Soap and Water Discharge Instruction: May shower and wash wound with dial antibacterial soap and water prior to dressing change. Wound Cleanser Discharge Instruction: Cleanse the wound with wound cleanser prior to applying a clean dressing using gauze sponges, not tissue or cotton balls. Byram Ancillary Kit - 15 Day Supply Discharge Instruction: Use supplies as instructed; Kit contains: (15) Saline Bullets; (15) 3x3 Gauze; 15 pr Gloves Peri-Wound Care Topical Primary Dressing Hydrofera Blue Classic Foam, 2x2 in Discharge Instruction: Moisten with saline prior to applying to wound bed Secondary Dressing Bordered Gauze, 4x4 in Discharge Instruction: Apply over primary dressing as directed. Secured With Compression Wrap Compression Stockings Add-Ons Electronic Signature(s) Signed: 06/07/2021 9:56:41 AM By: Dellie Catholic RN Signed: 06/07/2021 4:55:42 PM By: Adline Peals Entered By: Dellie Catholic on 06/07/2021 09:26:32 -------------------------------------------------------------------------------- Vitals Details Patient Name: Date of Service: [name] [name] [facility] THA N L. 06/07/2021 9:15 A M Medical Record Number: [id number] Patient Account Number: [id number] Date of Birth/Sex: Treating RN: 1955/06/20 (66 y.o. [name] Primary Care Sylis Ketchum: [name], Alyssa Other Clinician: Referring Lab[place]ford Schnitker: Treating Junaid Wurzer/Extender: Thana Ates, Alyssa Weeks in Treatment: 64 Vital Signs Time Taken: 09:22 Temperature (F): 98.4 Height (in): 74 Pulse (bpm): 59 Weight (lbs): 151 Respiratory Rate (breaths/min): 18 Body Mass Index (BMI): 19.4 Blood Pressure (mmHg): 126/69 Capillary Blood Glucose (mg/dl): 138 Reference Range: 80 - 120 mg / dl Electronic Signature(s) Signed: 06/07/2021 4:55:42 PM By: Adline Peals Entered By: Adline Peals on 06/07/2021 09:23:15

## 2021-06-09 ENCOUNTER — Encounter (INDEPENDENT_AMBULATORY_CARE_PROVIDER_SITE_OTHER): Payer: Self-pay | Admitting: Ophthalmology

## 2021-06-09 ENCOUNTER — Ambulatory Visit (INDEPENDENT_AMBULATORY_CARE_PROVIDER_SITE_OTHER): Payer: HMO | Admitting: Ophthalmology

## 2021-06-09 DIAGNOSIS — H25813 Combined forms of age-related cataract, bilateral: Secondary | ICD-10-CM | POA: Diagnosis not present

## 2021-06-09 DIAGNOSIS — I10 Essential (primary) hypertension: Secondary | ICD-10-CM | POA: Diagnosis not present

## 2021-06-09 DIAGNOSIS — E113513 Type 2 diabetes mellitus with proliferative diabetic retinopathy with macular edema, bilateral: Secondary | ICD-10-CM

## 2021-06-09 DIAGNOSIS — H35033 Hypertensive retinopathy, bilateral: Secondary | ICD-10-CM

## 2021-06-09 DIAGNOSIS — H4312 Vitreous hemorrhage, left eye: Secondary | ICD-10-CM

## 2021-06-09 MED ORDER — BEVACIZUMAB CHEMO INJECTION 1.25MG/0.05ML SYRINGE FOR KALEIDOSCOPE
1.2500 mg | INTRAVITREAL | Status: AC | PRN
  Administered 2021-06-09: 1.25 mg via INTRAVITREAL

## 2021-06-09 NOTE — Patient Instructions (Signed)
When giving a bolus, always put in insulin dose, and blood sugar readings.   Give a correction bolus when blood sugars go over 250, and every evening before sleep.

## 2021-06-09 NOTE — Progress Notes (Signed)
He is here today because patient's mother called saying he is having trouble with his pump and needs to be seen ASAP,  He was worked in.  He reports that his pump keeps going out of automated mode.  When downloaded it was seen that his blood sugars were running high and pump was giving him the alarm that he needs to do a correction dose, but he was not doing this. After reviewing with patient this alarm sequence, I discovered that he is not adding a blood sugar to boluses.  We discussed the need to do this so that the bolus calculator can add more insulin to this bolus to bring down high blood sugars or to reduce the bolus to prevent lows.   Patient was encouraged to re read his starter booklet, and reviewed this bolus procedure again. Also reminded him that if his blood sugars do go high, for whatever reason, the pump will increase his basal insulin and max out for 3 hours.  If longer, the pump will send a message to patient to do a correction dose, or it will knock him out of the automated mode.  We reviewed how to do a correction dose, and it was recommended that he do this ever time he sees his blood sugar go over 250, especially at HS.  He reported good understanding of this. Reviewed how to do a correction dose, and he reported that he understood how and why he needed to do this.

## 2021-06-12 DIAGNOSIS — F17210 Nicotine dependence, cigarettes, uncomplicated: Secondary | ICD-10-CM | POA: Diagnosis not present

## 2021-06-12 DIAGNOSIS — L409 Psoriasis, unspecified: Secondary | ICD-10-CM | POA: Diagnosis not present

## 2021-06-12 DIAGNOSIS — E113299 Type 2 diabetes mellitus with mild nonproliferative diabetic retinopathy without macular edema, unspecified eye: Secondary | ICD-10-CM | POA: Diagnosis not present

## 2021-06-12 DIAGNOSIS — K5904 Chronic idiopathic constipation: Secondary | ICD-10-CM | POA: Diagnosis not present

## 2021-06-12 DIAGNOSIS — Z794 Long term (current) use of insulin: Secondary | ICD-10-CM | POA: Diagnosis not present

## 2021-06-12 DIAGNOSIS — N401 Enlarged prostate with lower urinary tract symptoms: Secondary | ICD-10-CM | POA: Diagnosis not present

## 2021-06-12 DIAGNOSIS — E11621 Type 2 diabetes mellitus with foot ulcer: Secondary | ICD-10-CM | POA: Diagnosis not present

## 2021-06-12 DIAGNOSIS — Z7901 Long term (current) use of anticoagulants: Secondary | ICD-10-CM | POA: Diagnosis not present

## 2021-06-12 DIAGNOSIS — E1151 Type 2 diabetes mellitus with diabetic peripheral angiopathy without gangrene: Secondary | ICD-10-CM | POA: Diagnosis not present

## 2021-06-12 DIAGNOSIS — I2510 Atherosclerotic heart disease of native coronary artery without angina pectoris: Secondary | ICD-10-CM | POA: Diagnosis not present

## 2021-06-12 DIAGNOSIS — D649 Anemia, unspecified: Secondary | ICD-10-CM | POA: Diagnosis not present

## 2021-06-12 DIAGNOSIS — E1142 Type 2 diabetes mellitus with diabetic polyneuropathy: Secondary | ICD-10-CM | POA: Diagnosis not present

## 2021-06-12 DIAGNOSIS — G8929 Other chronic pain: Secondary | ICD-10-CM | POA: Diagnosis not present

## 2021-06-12 DIAGNOSIS — N138 Other obstructive and reflux uropathy: Secondary | ICD-10-CM | POA: Diagnosis not present

## 2021-06-12 DIAGNOSIS — F419 Anxiety disorder, unspecified: Secondary | ICD-10-CM | POA: Diagnosis not present

## 2021-06-12 DIAGNOSIS — E559 Vitamin D deficiency, unspecified: Secondary | ICD-10-CM | POA: Diagnosis not present

## 2021-06-12 DIAGNOSIS — M720 Palmar fascial fibromatosis [Dupuytren]: Secondary | ICD-10-CM | POA: Diagnosis not present

## 2021-06-12 DIAGNOSIS — I509 Heart failure, unspecified: Secondary | ICD-10-CM | POA: Diagnosis not present

## 2021-06-12 DIAGNOSIS — I480 Paroxysmal atrial fibrillation: Secondary | ICD-10-CM | POA: Diagnosis not present

## 2021-06-12 DIAGNOSIS — E785 Hyperlipidemia, unspecified: Secondary | ICD-10-CM | POA: Diagnosis not present

## 2021-06-12 DIAGNOSIS — I110 Hypertensive heart disease with heart failure: Secondary | ICD-10-CM | POA: Diagnosis not present

## 2021-06-12 DIAGNOSIS — L97511 Non-pressure chronic ulcer of other part of right foot limited to breakdown of skin: Secondary | ICD-10-CM | POA: Diagnosis not present

## 2021-06-12 DIAGNOSIS — M47812 Spondylosis without myelopathy or radiculopathy, cervical region: Secondary | ICD-10-CM | POA: Diagnosis not present

## 2021-06-12 DIAGNOSIS — F3341 Major depressive disorder, recurrent, in partial remission: Secondary | ICD-10-CM | POA: Diagnosis not present

## 2021-06-20 ENCOUNTER — Encounter (HOSPITAL_BASED_OUTPATIENT_CLINIC_OR_DEPARTMENT_OTHER): Payer: HMO | Admitting: General Surgery

## 2021-06-20 DIAGNOSIS — E11621 Type 2 diabetes mellitus with foot ulcer: Secondary | ICD-10-CM | POA: Diagnosis not present

## 2021-06-20 DIAGNOSIS — L97512 Non-pressure chronic ulcer of other part of right foot with fat layer exposed: Secondary | ICD-10-CM | POA: Diagnosis not present

## 2021-06-20 NOTE — Progress Notes (Signed)
SALOME, COZBY ([id number]) Visit Report for 06/20/2021 Chief Complaint Document Details Patient Name: Date of Service: [name] [name] [facility] THA N L. 06/20/2021 10:00 A M Medical Record Number: [id number] Patient Account Number: [id number] Date of Birth/Sex: Treating RN: [birth year] (66 y.o. [name] Primary Care Provider: [name], Alyssa Other Clinician: Referring Provider: Treating Provider/Extender: Orland Jarred in Treatment: 53 Information Obtained from: Patient Chief Complaint 07/07/2020; patient is in clinic for today for review of pressure ulcers on his coccyx and left heel and diabetic foot ulcers bilaterally. Electronic Signature(s) Signed: 06/20/2021 10:14:44 AM By: Fredirick Maudlin MD FACS Entered By: Fredirick Maudlin on 06/20/2021 10:14:43 -------------------------------------------------------------------------------- Debridement Details Patient Name: Date of Service: [name] [name] [facility] THA N L. 06/20/2021 10:00 A M Medical Record Number: [id number] Patient Account Number: [id number] Date of Birth/Sex: Treating RN: 20-Mar-1955 (66 y.o. [name] Primary Care Provider: [name], Alyssa Other Clinician: Referring Provider: Treating Provider/Extender: Orland Jarred in Treatment: 49 Debridement Performed for Assessment: Wound #3 Right,Lateral Foot Performed By: Physician Fredirick Maudlin, MD Debridement Type: Debridement Severity of Tissue Pre Debridement: Fat layer exposed Level of Consciousness (Pre-procedure): Awake and Alert Pre-procedure Verification/Time Out Yes - 10:11 Taken: Start Time: 10:11 Pain Control: Lidocaine 4% T opical Solution T Area Debrided (L x W): otal 0.4 (cm) x 0.4 (cm) = 0.16 (cm) Tissue and other material debrided: Viable, Non-Viable, Slough, Subcutaneous, Slough Level: Skin/Subcutaneous Tissue Debridement Description: Excisional Instrument: Curette Bleeding:  Minimum Hemostasis Achieved: Pressure Procedural Pain: 0 Post Procedural Pain: 0 Response to Treatment: Procedure was tolerated well Level of Consciousness (Post- Awake and Alert procedure): Post Debridement Measurements of Total Wound Length: (cm) 0.4 Width: (cm) 0.4 Depth: (cm) 0.1 Volume: (cm) 0.013 Character of Wound/Ulcer Post Debridement: Improved Severity of Tissue Post Debridement: Fat layer exposed Post Procedure Diagnosis Same as Pre-procedure Electronic Signature(s) Signed: 06/20/2021 11:12:49 AM By: Fredirick Maudlin MD FACS Signed: 06/20/2021 4:52:42 PM By: Adline Peals Entered By: Adline Peals on 06/20/2021 10:13:00 -------------------------------------------------------------------------------- HPI Details Patient Name: Date of Service: [name] [name] [facility] THA N L. 06/20/2021 10:00 A M Medical Record Number: [id number] Patient Account Number: [id number] Date of Birth/Sex: Treating RN: [birth year] (66 y.o. [name] Primary Care Provider: [name], Alyssa Other Clinician: Referring Provider: Treating Provider/Extender: Thana Ates, Alyssa Weeks in Treatment: 60 History of Present Illness HPI Description: ADMISSION 07/07/2020 This is a 66 year old man who is a type II diabetic. I have reviewed this in epic. It appeared that his diabetes was diagnosed in 2009 at which time he was on metformin. He was followed for a period of time by an endocrinologist with [facility] to always labeled him as a type II diabetic. Recently in epic there is been a switch to type 1 diabetes. If there were defining antibody test done I do not see them and I think the totality of the evidence would suggest that he is a type II diabetic. His recent problem started in May when he fell down 17 stairs. He suffered a scapular fracture he was hospitalized at [facility] for 9 days and then discharged to [facility] His mother who is present said that most of these wounds seem to  develop surrounding the discharge from hospital or the stay at [facility]. He has an area on his lower sacrum which appears currently to be a stage II but per their description this is actually improved quite a bit. He has an area on the left lateral foot at roughly the  base of the fifth metatarsal, the left Achilles heel and the right fifth metatarsal head laterally they have been using topical antibiotics. Seen by primary care yesterday and placed on Keflex The patient had arterial studies on 05/05/2020. I wondered whether this had something to do with wounds on his feet at that time but I have not been able to determine this. At that time his ABI on the right was 1.09 with a TBI of 0.65 on the left 0.88 with a TBI of 0.44. Waveforms were triphasic on the right monophasic on the left. He likely has significant PAD on the left Past medical history includes type 2 diabetes poorly controlled last hemoglobin A1c at 12.7 in May, hypertension, hyperlipidemia, atrial fib on Eliquis, coronary artery disease status post CABG, left scapular fracture in May of this year, AAA repair. Bilateral leg weakness ABIs were not done in our clinic as they were just done in May of this year at [facility] 7/14; patient arrives back in clinic he has areas on the left Achilles heel, base of the left fifth metatarsal, right lateral fifth metatarsal. The area he had over the coccyx appears to be healed but this area remains vulnerable. We have been using Iodoflex. I rereviewed his ABIs which were done in May. I do not know exactly what prompted these test however they do show a TBI of 0.44 on the left and monophasic waveforms. 7/21; the patient's wounds are about the same. Certainly no healing and the nurses noted some green drainage on the right fifth metatarsal head, left Achilles a little larger. We have been using silver alginate really without any improvement. We did get an appointment with vascular surgery  however its that it did the end of August and they seem to want to repeat the noninvasive test that they have already done in May. I am not sure what the issue here is. We advised the patient's sister to call and point to the idea that they have already had noninvasive studies. The real question I have is does this patient need an angiogram. He is a diabetic and it is possible that the ABIs are falsely elevated because of medial calcification. At least on the left his TBI was abnormal 08/08/2020 upon evaluation today patient appears to be doing about the same in regard to his wounds. He still waiting the appointment with vascular. This is something that hopefully should be undertaken shortly. The 29th of this month is when he is actually scheduled although they have put in a call to move this up if at all possible. Fortunately there does not appear to be any signs of active infection at this time. No fevers, chills, nausea, vomiting, or diarrhea. 8/18; appointment with Dr. Trula Slade on 8/29. The patient has wounds on the left Achilles heel, left lateral foot and the right lateral fifth metatarsal head. We have been using Santyl under Hydrofera Blue. The surface of especially the left heel looks better to me than what I remember. 9/1; since the patient was last here he underwent angiography by Dr. Trula Slade he also underwent noninvasive studies first showing a ABI of of 0.64 with monophasic waveforms. His TBI was 0.35 with a left great toe pressure of 46 this is about the same pattern as we saw previously. The left was a lot worse than the right. He underwent an angiogram on 08/29/2020. He had multiple 80% peroneal artery stenosis which was his dominant vessel which was treated with angioplasty with no residual  stenosis. 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 The anterior tibial artery was also occluded. An attempt was made at recanalization of the anterior tibial artery however this was unsuccessful.  Noted that the peroneal artery reconstitutes the posterior tibial artery and the anterior tibial artery creating the plantar arch. The patient has areas on the Achilles left heel left lateral foot and right lateral fifth metatarsal head we have been using Santyl and Hydrofera Blue 9/22; since the patient was last here he went for repeat vascular studies. On the right his ABI was 0.94 with biphasic waveforms TBI of 0.73 with a great toe pressure of 75. On the left which is the problematic wound his ABI was 1.08 but with biphasic waveforms at the PTA. Biphasic waveforms at the dorsalis pedis his TBI was 0.63 with a great toe pressure of 65. On the left however this does represent a fairly marked improvement. We have been using Santyl and Hydrofera Blue 9/29 left Achilles area. Still is having necrotic surface. Bleeds very easily as the patient is on a combination of Eliquis aspirin and Plavix just stopped yesterday. I am not going to do any debridement until next week. We are using Santyl and Hydrofera Blue. His arterial status on the right was actually quite good. The area on the fifth metatarsal head needs additional debridement. A total contact cast in this area is not out of the question. He does describe claudication I believe with minimal activity. He is not very active X-ray we ordered last week is still pending in terms of the report 10/6 left Achilles area. In terms of the necrotic surface this is quite a bit better. There is still some debris in the posterior part of the wound however this looks better. Anteriorly the wound appears to be epithelialized there is still a divot in the middle of this but this does not probe to deep tissue. The area on the right fifth metatarsal head laterally really is not making as much progress as I would have liked. Still not completely a viable surface. Both the patient and his wife agree that he is not an external rotator in terms of ambulation [an outie]. We  have been using Santyl and Hydrofera Blue for a prolonged period. On the left Achilles which looked like it is worse wound this seems to be making some progress not so much on the right foot. The patient and his wife state that they are rigorous about offloading both of these areas He was treated with angioplasty as I believe on the right peroneal artery that had multiple stenosis. This is a dominant vessel the anterior tibial and posterior tibial arteries are occluded however the foot is fed via collaterals from the peroneal. From my notes I am not able to understand the vascular supply on the left I will have to review this. 10/13; left Achilles top part of this looks healthy granulation in the bottom part of this wound again completely necrotic requiring debridement. The area on the right lateral fifth met head also requires debridement. The majority of this is epithelialized however he she there is a refractory part to this. 10/20; left Achilles 100% better looking wound surface. Necrotic upper 50% seems to have resolved at least by inspection under illumination. Culture I did of this area last week postdebridement showed Proteus and Citrobacter. Both sensitive to quinolones and I put him on ciprofloxacin. We have been using silver alginate on the wound on the heel and Santyl and Hydrofera Blue  on the right fifth met head. 10/27; left Achilles is measuring much smaller we are using topical gentamicin under silver alginate. Right fifth met head still fibrinous debris on the surface we are using Santyl and Hydrofera Blue. Patient lost his brother suddenly last week he was unable to keep his vascular surgery appointment. 11/3; left Achilles not as viable surface as I might like. Some hypergranulation. Right fifth metatarsal head still completely nonviable surface. It turns out that she was not using topical gentamicin on the left heel [mother] they have been using Santyl and Hydrofera Blue on the  right fifth metatarsal head. The patient did have noninvasive vascular studies on 10/ 28/22. He had triphasic waveforms up to the level of the proximal PTA and then a 50 to 74% stenosis in the mid PTA. Peroneal artery was triphasic as was the anterior tibial. He follows up with Dr. Trula Slade on 11/7. 11/17; since the patient was last here he saw Dr. Trula Slade and went for ANGIOGRAPHY on 11/15/2020. He has wounds on the left Achilles heel and the right lateral fifth metatarsal head. He has known PAD and had previous revascularization in August. On this occasion on the right his right common femoral, profundofemoral and superficial femoral and popliteal arteries were widely patent the dominant vessel is the posterior tibial which had several areas of stenosis greater than 80% he went underwent angioplasty in this area. He had no interventions on the left however he is left common femoral, profundofemoral and superficial femoral arteries were widely patent as well as the popliteal he has recurrent stenosis greater than 50% within the peroneal artery which is a single- vessel runoff. The thought was that he may require further angiography and reangioplasty on the left peroneal artery. Overall the patient has severe PAD he has undergone angioplasty of the right posterior tibial artery. He arrives in clinic today with the Achilles heel area on the left surprisingly better with marked improvement in the surface area on the right not nearly as good but stable 12/8; patient has follow-up arterial Doppler studies on 12/19 at the [facility]. He is now being revascularized on both sides. Fortunately on the right Achilles area this is just about closed slight scabbing/eschar. I did not remove this today. The area on the right fifth met head looked better after last week's debridement we have been using Santyl and Hydrofera Blue 01/05/21 the patient has not been here in almost a month. He was supposed to have  follow-up Doppler studies on 12/19 look these up. He has been revascularized on both sides The left Achilles area is closed he does not need to dress this but will need to protect this area on an ongoing basis likely indefinitely. He still has the area on the right fifth plantar metatarsal head. The area is measuring smaller he has a rim of epithelialization. The granulation tissue still a bit gelatinous for my liking 1/26; The left Achilles remains closed. Plantar right fifth metatarsal head is smaller and appears to be healthy. He has not followed up with vascular surgery and does not seem particularly interested. He did not get his follow-up arterial Dopplers. I explained to him that angioplasty of the small arteries in his leg is a limb saving procedure but possibility of restenosis is reasonably high and he should follow-up with follow-up noninvasive studies. Frankly he did not seem interested today 03/30/2021: The patient has not been seen here since the end of January. He never followed up with vascular surgery  and today states that he refuses to be seen at [facility] due to his dissatisfaction with the fact that his arteries seem to restenose. The wound on his left Achilles is still closed. The plantar right fifth metatarsal head is quite a bit bigger than it was at his last visit. He is accompanied by his mother who reports that it had gotten down to a very small size but then she went out of town for a week and during that time it enlarged to its current dimensions. The patient is wearing the functional equivalent of house slippers for shoes and is not making any effort to offload. He refuses to wear a forefoot offloading sandal due to instability on his feet and says that when a total contact cast was attempted, it was "a total disaster" and he is unwilling to consider this again. He has been in PolyMem silver since at least the beginning of January. 04/26/2021: The patient is here today after about  a month. He has been using Hydrofera Blue to his wound and today it is markedly improved. It is small and clean. He is scheduled to see Dr. Gwenlyn Found in interventional cardiology tomorrow. 05/24/2021: Once again, the patient returns after about a month. I had referred him to Dr. Gwenlyn Found and the patient walked out from that appointment. Apparently there was some confusion over why he was seeing the cardiologist for his peripheral arterial disease. He saw his primary cardiologist, Dr. Davina Poke, last week and has been re-referred to see Dr. Gwenlyn Found, the appointment is tomorrow. Basically the wound is a little bit bigger today but clean without any slough accumulation. The periwound callus that has accumulated is completely macerated. I thought he had been in [facility], but the dressing that had been removed and discarded today looks more like PolyMem Ag. 05/31/2021: The patient decided not to see Dr. Gwenlyn Found because he feels like anytime he has a vascular intervention, the arteries just closed up again and he does not want to do this anymore. He does not really grasp the nature of the disease process. The wound as a bit smaller today and very clean. No accumulation of periwound callus. We have been using Hydrofera Blue. 06/07/2021: The wound looks good today. It is a little bit smaller without any significant periwound callus accumulation. The surface is clean with good granulation tissue. 06/20/2021: The patient's mother was out of town last week and so he could not come to clinic. He did have help with his wound care during her absence. Today, the wound looks very good. It is a little bit smaller. There is some slough accumulation on the surface. Electronic Signature(s) Signed: 06/20/2021 10:15:39 AM By: Fredirick Maudlin MD FACS Entered By: Fredirick Maudlin on 06/20/2021 10:15:39 -------------------------------------------------------------------------------- Physical Exam Details Patient Name: Date of  Service: [name] [name] [facility] THA N L. 06/20/2021 10:00 A M Medical Record Number: [id number] Patient Account Number: [id number] Date of Birth/Sex: Treating RN: 19-Oct-1955 (66 y.o. [name] Primary Care Provider: [name], Alyssa Other Clinician: Referring Provider: Treating Provider/Extender: Fredirick Maudlin [name], Alyssa Weeks in Treatment: 42 Constitutional . Bradycardic, asymptomatic.. . . No acute distress.Marland Kitchen Respiratory Normal work of breathing on room air.. Notes 06/20/2021: Today, the wound looks very good. It is a little bit smaller. There is some slough accumulation on the surface. Electronic Signature(s) Signed: 06/20/2021 10:27:21 AM By: Fredirick Maudlin MD FACS Previous Signature: 06/20/2021 10:17:22 AM Version By: Fredirick Maudlin MD FACS Entered By: Fredirick Maudlin on 06/20/2021 10:27:21 -------------------------------------------------------------------------------- Physician Orders  Details Patient Name: Date of Service: [name] [name] [facility] THA N L. 06/20/2021 10:00 A M Medical Record Number: [id number] Patient Account Number: [id number] Date of Birth/Sex: Treating RN: [birth year] (66 y.o. [name] Primary Care Provider: [name], Alyssa Other Clinician: Referring Provider: Treating Provider/Extender: Orland Jarred in Treatment: 17 Verbal / Phone Orders: No Diagnosis Coding ICD-10 Coding Code Description L97.511 Non-pressure chronic ulcer of other part of right foot limited to breakdown of skin E11.621 Type 2 diabetes mellitus with foot ulcer E11.51 Type 2 diabetes mellitus with diabetic peripheral angiopathy without gangrene E11.42 Type 2 diabetes mellitus with diabetic polyneuropathy Follow-up Appointments ppointment in 1 week. - Dr. Celine Ahr - Room 2 - 6/27 at 8:30 AM Return A Bathing/ Shower/ Hygiene May shower and wash wound with soap and water. - with dressing changes only. Edema Control - Lymphedema / SCD /  Other Elevate legs to the level of the heart or above for 30 minutes daily and/or when sitting, a frequency of: - 3-4 times a day throughout the day. Moisturize legs daily. - every night before bed. *** closely monitor feet every night before bed. Off-Loading Other: - float heels while resting in bed or chair. Use a pillow or rolled sheet under legs to offload the heels. Ensure no pressure to right lateral foot. Home Health No change in wound care orders this week; continue Home Health for wound care. May utilize formulary equivalent dressing for wound treatment orders unless otherwise specified. Other Home Health Orders/Instructions: - [facility] home health weekly changes and all other days family to change. Wound Treatment Wound #3 - Foot Wound Laterality: Right, Lateral Cleanser: Soap and Water Every Other Day/10 Days Discharge Instructions: May shower and wash wound with dial antibacterial soap and water prior to dressing change. Cleanser: Wound Cleanser Every Other Day/10 Days Discharge Instructions: Cleanse the wound with wound cleanser prior to applying a clean dressing using gauze sponges, not tissue or cotton balls. Prim Dressing: Hydrofera Blue Classic Foam, 2x2 in (Generic) Every Other Day/10 Days ary Discharge Instructions: Moisten with saline prior to applying to wound bed Secondary Dressing: Bordered Gauze, 4x4 in (DME) (Generic) Every Other Day/10 Days Discharge Instructions: Apply over primary dressing as directed. Electronic Signature(s) Signed: 06/20/2021 12:03:02 PM By: Fredirick Maudlin MD FACS Signed: 06/20/2021 4:52:42 PM By: Adline Peals Previous Signature: 06/20/2021 11:12:49 AM Version By: Fredirick Maudlin MD FACS Entered By: Adline Peals on 06/20/2021 12:00:18 -------------------------------------------------------------------------------- Problem List Details Patient Name: Date of Service: [name] [name] [facility] THA N L. 06/20/2021 10:00 A M Medical Record  Number: [id number] Patient Account Number: [id number] Date of Birth/Sex: Treating RN: 29-Jan-1955 (66 y.o. [name] Primary Care Provider: [name], Alyssa Other Clinician: Referring Provider: Treating Provider/Extender: Orland Jarred in Treatment: 39 Active Problems ICD-10 Encounter Code Description Active Date MDM Diagnosis L97.511 Non-pressure chronic ulcer of other part of right foot limited to breakdown of 07/07/2020 No Yes skin E11.621 Type 2 diabetes mellitus with foot ulcer 07/07/2020 No Yes E11.51 Type 2 diabetes mellitus with diabetic peripheral angiopathy without gangrene 09/01/2020 No Yes E11.42 Type 2 diabetes mellitus with diabetic polyneuropathy 07/07/2020 No Yes Inactive Problems ICD-10 Code Description Active Date Inactive Date L97.528 Non-pressure chronic ulcer of other part of left foot with other specified severity 07/07/2020 07/07/2020 L89.152 Pressure ulcer of sacral region, stage 2 07/07/2020 07/07/2020 F16.384 Pressure ulcer of left heel, stage 2 07/07/2020 07/07/2020 Resolved Problems Electronic Signature(s) Signed: 06/20/2021 10:14:13 AM By: Fredirick Maudlin MD FACS Entered By:  Fredirick Maudlin on 06/20/2021 10:14:13 -------------------------------------------------------------------------------- Progress Note Details Patient Name: Date of Service: [name] [name] [facility] THA N L. 06/20/2021 10:00 A M Medical Record Number: [id number] Patient Account Number: [id number] Date of Birth/Sex: Treating RN: May 05, 1955 (66 y.o. [name] Primary Care Provider: [name], Alyssa Other Clinician: Referring Provider: Treating Provider/Extender: Orland Jarred in Treatment: 20 Subjective Chief Complaint Information obtained from Patient 07/07/2020; patient is in clinic for today for review of pressure ulcers on his coccyx and left heel and diabetic foot ulcers bilaterally. History of Present Illness  (HPI) ADMISSION 07/07/2020 This is a 67 year old man who is a type II diabetic. I have reviewed this in epic. It appeared that his diabetes was diagnosed in 2009 at which time he was on metformin. He was followed for a period of time by an endocrinologist with [facility] to always labeled him as a type II diabetic. Recently in epic there is been a switch to type 1 diabetes. If there were defining antibody test done I do not see them and I think the totality of the evidence would suggest that he is a type II diabetic. His recent problem started in May when he fell down 17 stairs. He suffered a scapular fracture he was hospitalized at [facility] for 9 days and then discharged to [facility]. His mother who is present said that most of these wounds seem to develop surrounding the discharge from hospital or the stay at [facility] He has an area on his lower sacrum which appears currently to be a stage II but per their description this is actually improved quite a bit. He has an area on the left lateral foot at roughly the base of the fifth metatarsal, the left Achilles heel and the right fifth metatarsal head laterally they have been using topical antibiotics. Seen by primary care yesterday and placed on Keflex The patient had arterial studies on 05/05/2020. I wondered whether this had something to do with wounds on his feet at that time but I have not been able to determine this. At that time his ABI on the right was 1.09 with a TBI of 0.65 on the left 0.88 with a TBI of 0.44. Waveforms were triphasic on the right monophasic on the left. He likely has significant PAD on the left Past medical history includes type 2 diabetes poorly controlled last hemoglobin A1c at 12.7 in May, hypertension, hyperlipidemia, atrial fib on Eliquis, coronary artery disease status post CABG, left scapular fracture in May of this year, AAA repair. Bilateral leg weakness ABIs were not done in our clinic as they were just done in May of  this year at [facility] 7/14; patient arrives back in clinic he has areas on the left Achilles heel, base of the left fifth metatarsal, right lateral fifth metatarsal. The area he had over the coccyx appears to be healed but this area remains vulnerable. We have been using Iodoflex. I rereviewed his ABIs which were done in May. I do not know exactly what prompted these test however they do show a TBI of 0.44 on the left and monophasic waveforms. 7/21; the patient's wounds are about the same. Certainly no healing and the nurses noted some green drainage on the right fifth metatarsal head, left Achilles a little larger. We have been using silver alginate really without any improvement. We did get an appointment with vascular surgery however its that it did the end of August and they seem to want to repeat  the noninvasive test that they have already done in May. I am not sure what the issue here is. We advised the patient's sister to call and point to the idea that they have already had noninvasive studies. The real question I have is does this patient need an angiogram. He is a diabetic and it is possible that the ABIs are falsely elevated because of medial calcification. At least on the left his TBI was abnormal 08/08/2020 upon evaluation today patient appears to be doing about the same in regard to his wounds. He still waiting the appointment with vascular. This is something that hopefully should be undertaken shortly. The 29th of this month is when he is actually scheduled although they have put in a call to move this up if at all possible. Fortunately there does not appear to be any signs of active infection at this time. No fevers, chills, nausea, vomiting, or diarrhea. 8/18; appointment with Dr. Trula Slade on 8/29. The patient has wounds on the left Achilles heel, left lateral foot and the right lateral fifth metatarsal head. We have been using Santyl under Hydrofera Blue. The surface of  especially the left heel looks better to me than what I remember. 9/1; since the patient was last here he underwent angiography by Dr. Trula Slade he also underwent noninvasive studies first showing a ABI of of 0.64 with monophasic waveforms. His TBI was 0.35 with a left great toe pressure of 46 this is about the same pattern as we saw previously. The left was a lot worse than the right. He underwent an angiogram on 08/29/2020. He had multiple 80% peroneal artery stenosis which was his dominant vessel which was treated with angioplasty with no residual stenosis. 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 The anterior tibial artery was also occluded. An attempt was made at recanalization of the anterior tibial artery however this was unsuccessful. Noted that the peroneal artery reconstitutes the posterior tibial artery and the anterior tibial artery creating the plantar arch. The patient has areas on the Achilles left heel left lateral foot and right lateral fifth metatarsal head we have been using Santyl and Hydrofera Blue 9/22; since the patient was last here he went for repeat vascular studies. On the right his ABI was 0.94 with biphasic waveforms TBI of 0.73 with a great toe pressure of 75. On the left which is the problematic wound his ABI was 1.08 but with biphasic waveforms at the PTA. Biphasic waveforms at the dorsalis pedis his TBI was 0.63 with a great toe pressure of 65. On the left however this does represent a fairly marked improvement. We have been using Santyl and Hydrofera Blue 9/29 left Achilles area. Still is having necrotic surface. Bleeds very easily as the patient is on a combination of Eliquis aspirin and Plavix just stopped yesterday. I am not going to do any debridement until next week. We are using Santyl and Hydrofera Blue. His arterial status on the right was actually quite good. The area on the fifth metatarsal head needs additional debridement. A total contact cast in this  area is not out of the question. He does describe claudication I believe with minimal activity. He is not very active X-ray we ordered last week is still pending in terms of the report 10/6 left Achilles area. In terms of the necrotic surface this is quite a bit better. There is still some debris in the posterior part of the wound however this looks better. Anteriorly the wound appears to be  epithelialized there is still a divot in the middle of this but this does not probe to deep tissue. The area on the right fifth metatarsal head laterally really is not making as much progress as I would have liked. Still not completely a viable surface. Both the patient and his wife agree that he is not an external rotator in terms of ambulation [an outie]. We have been using Santyl and Hydrofera Blue for a prolonged period. On the left Achilles which looked like it is worse wound this seems to be making some progress not so much on the right foot. The patient and his wife state that they are rigorous about offloading both of these areas He was treated with angioplasty as I believe on the right peroneal artery that had multiple stenosis. This is a dominant vessel the anterior tibial and posterior tibial arteries are occluded however the foot is fed via collaterals from the peroneal. From my notes I am not able to understand the vascular supply on the left I will have to review this. 10/13; left Achilles top part of this looks healthy granulation in the bottom part of this wound again completely necrotic requiring debridement. oo The area on the right lateral fifth met head also requires debridement. The majority of this is epithelialized however he she there is a refractory part to this. 10/20; left Achilles 100% better looking wound surface. Necrotic upper 50% seems to have resolved at least by inspection under illumination. Culture I did of this area last week postdebridement showed Proteus and Citrobacter.  Both sensitive to quinolones and I put him on ciprofloxacin. We have been using silver alginate on the wound on the heel and Santyl and Hydrofera Blue on the right fifth met head. 10/27; left Achilles is measuring much smaller we are using topical gentamicin under silver alginate. Right fifth met head still fibrinous debris on the surface we are using Santyl and Hydrofera Blue. Patient lost his brother suddenly last week he was unable to keep his vascular surgery appointment. 11/3; left Achilles not as viable surface as I might like. Some hypergranulation. Right fifth metatarsal head still completely nonviable surface. It turns out that she was not using topical gentamicin on the left heel [mother] they have been using Santyl and Hydrofera Blue on the right fifth metatarsal head. The patient did have noninvasive vascular studies on 10/ 28/22. He had triphasic waveforms up to the level of the proximal PTA and then a 50 to 74% stenosis in the mid PTA. Peroneal artery was triphasic as was the anterior tibial. He follows up with Dr. Trula Slade on 11/7. 11/17; since the patient was last here he saw Dr. Trula Slade and went for ANGIOGRAPHY on 11/15/2020. He has wounds on the left Achilles heel and the right lateral fifth metatarsal head. He has known PAD and had previous revascularization in August. On this occasion on the right his right common femoral, profundofemoral and superficial femoral and popliteal arteries were widely patent the dominant vessel is the posterior tibial which had several areas of stenosis greater than 80% he went underwent angioplasty in this area. He had no interventions on the left however he is left common femoral, profundofemoral and superficial femoral arteries were widely patent as well as the popliteal he has recurrent stenosis greater than 50% within the peroneal artery which is a single- vessel runoff. The thought was that he may require further angiography and reangioplasty on the  left peroneal artery. Overall the patient has severe PAD he  has undergone angioplasty of the right posterior tibial artery. He arrives in clinic today with the Achilles heel area on the left surprisingly better with marked improvement in the surface area on the right not nearly as good but stable 12/8; patient has follow-up arterial Doppler studies on 12/[facility] at the [facility]. He is now being revascularized on both sides. Fortunately on the right Achilles area this is just about closed slight scabbing/eschar. I did not remove this today. The area on the right fifth met head looked better after last week's debridement we have been using Santyl and Hydrofera Blue 01/05/21 the patient has not been here in almost a month. He was supposed to have follow-up Doppler studies on 12/19 look these up. He has been revascularized on both sides The left Achilles area is closed he does not need to dress this but will need to protect this area on an ongoing basis likely indefinitely. He still has the area on the right fifth plantar metatarsal head. The area is measuring smaller he has a rim of epithelialization. The granulation tissue still a bit gelatinous for my liking 1/26; The left Achilles remains closed. Plantar right fifth metatarsal head is smaller and appears to be healthy. He has not followed up with vascular surgery and does not seem particularly interested. He did not get his follow-up arterial Dopplers. I explained to him that angioplasty of the small arteries in his leg is a limb saving procedure but possibility of restenosis is reasonably high and he should follow-up with follow-up noninvasive studies. Frankly he did not seem interested today 03/30/2021: The patient has not been seen here since the end of January. He never followed up with vascular surgery and today states that he refuses to be seen at [facility] due to his dissatisfaction with the fact that his arteries seem to restenose. The wound on his  left Achilles is still closed. The plantar right fifth metatarsal head is quite a bit bigger than it was at his last visit. He is accompanied by his mother who reports that it had gotten down to a very small size but then she went out of town for a week and during that time it enlarged to its current dimensions. The patient is wearing the functional equivalent of house slippers for shoes and is not making any effort to offload. He refuses to wear a forefoot offloading sandal due to instability on his feet and says that when a total contact cast was attempted, it was "a total disaster" and he is unwilling to consider this again. He has been in PolyMem silver since at least the beginning of January. 04/26/2021: The patient is here today after about a month. He has been using Hydrofera Blue to his wound and today it is markedly improved. It is small and clean. He is scheduled to see Dr. Gwenlyn Found in interventional cardiology tomorrow. 05/24/2021: Once again, the patient returns after about a month. I had referred him to Dr. Gwenlyn Found and the patient walked out from that appointment. Apparently there was some confusion over why he was seeing the cardiologist for his peripheral arterial disease. He saw his primary cardiologist, Dr. Davina Poke, last week and has been re-referred to see Dr. Gwenlyn Found, the appointment is tomorrow. Basically the wound is a little bit bigger today but clean without any slough accumulation. The periwound callus that has accumulated is completely macerated. I thought he had been in [facility], but the dressing that had been removed and discarded today looks  more like PolyMem Ag. 05/31/2021: The patient decided not to see Dr. Gwenlyn Found because he feels like anytime he has a vascular intervention, the arteries just closed up again and he does not want to do this anymore. He does not really grasp the nature of the disease process. The wound as a bit smaller today and very clean. No accumulation  of periwound callus. We have been using Hydrofera Blue. 06/07/2021: The wound looks good today. It is a little bit smaller without any significant periwound callus accumulation. The surface is clean with good granulation tissue. 06/20/2021: The patient's mother was out of town last week and so he could not come to clinic. He did have help with his wound care during her absence. Today, the wound looks very good. It is a little bit smaller. There is some slough accumulation on the surface. Patient History Information obtained from Patient. Family History Cancer - Paternal Grandparents,Father, Heart Disease - Father, Hypertension - Father, No family history of Diabetes, Hereditary Spherocytosis, Kidney Disease, Lung Disease, Seizures, Stroke, Thyroid Problems, Tuberculosis. Social History Current some day smoker - Landscape architect, Marital Status - Divorced, Alcohol Use - Rarely, Drug Use - No History, Caffeine Use - Daily. Medical History Cardiovascular Patient has history of Coronary Artery Disease, Hypertension Endocrine Patient has history of Type II Diabetes Musculoskeletal Patient has history of Osteoarthritis Neurologic Patient has history of Neuropathy Objective Constitutional Bradycardic, asymptomatic.Marland Kitchen No acute distress.. Vitals Time Taken: 10:05 AM, Height: 74 in, Weight: 151 lbs, BMI: 19.4, Temperature: 98.6 F, Pulse: 50 bpm, Respiratory Rate: 18 breaths/min, Blood Pressure: 124/69 mmHg, Capillary Blood Glucose: 189 mg/dl. Respiratory Normal work of breathing on room air.. General Notes: 06/20/2021: Today, the wound looks very good. It is a little bit smaller. There is some slough accumulation on the surface. Integumentary (Hair, Skin) Wound #3 status is Open. Original cause of wound was Gradually Appeared. The date acquired was: 06/21/2020. The wound has been in treatment 49 weeks. The wound is located on the Right,Lateral Foot. The wound measures 0.4cm length x 0.4cm width x 0.1cm  depth; 0.126cm^2 area and 0.013cm^3 volume. There is Fat Layer (Subcutaneous Tissue) exposed. There is no tunneling or undermining noted. There is a medium amount of serosanguineous drainage noted. The wound margin is flat and intact. There is small (1-33%) red, pink granulation within the wound bed. There is a large (67-100%) amount of necrotic tissue within the wound bed including Adherent Slough. Assessment Active Problems ICD-10 Non-pressure chronic ulcer of other part of right foot limited to breakdown of skin Type 2 diabetes mellitus with foot ulcer Type 2 diabetes mellitus with diabetic peripheral angiopathy without gangrene Type 2 diabetes mellitus with diabetic polyneuropathy Procedures Wound #3 Pre-procedure diagnosis of Wound #3 is a Diabetic Wound/Ulcer of the Lower Extremity located on the Right,Lateral Foot .Severity of Tissue Pre Debridement is: Fat layer exposed. There was a Excisional Skin/Subcutaneous Tissue Debridement with a total area of 0.16 sq cm performed by Fredirick Maudlin, MD. With the following instrument(s): Curette to remove Viable and Non-Viable tissue/material. Material removed includes Subcutaneous Tissue and Slough and after achieving pain control using Lidocaine 4% Topical Solution. No specimens were taken. A time out was conducted at 10:11, prior to the start of the procedure. A Minimum amount of bleeding was controlled with Pressure. The procedure was tolerated well with a pain level of 0 throughout and a pain level of 0 following the procedure. Post Debridement Measurements: 0.4cm length x 0.4cm width x 0.1cm depth; 0.013cm^3 volume. Character of  Wound/Ulcer Post Debridement is improved. Severity of Tissue Post Debridement is: Fat layer exposed. Post procedure Diagnosis Wound #3: Same as Pre-Procedure Plan Follow-up Appointments: Return Appointment in 1 week. - Dr. Celine Ahr - Room 2 - 6/27 at 8:30 AM Bathing/ Shower/ Hygiene: May shower and wash wound with  soap and water. - with dressing changes only. Edema Control - Lymphedema / SCD / Other: Elevate legs to the level of the heart or above for 30 minutes daily and/or when sitting, a frequency of: - 3-4 times a day throughout the day. Moisturize legs daily. - every night before bed. *** closely monitor feet every night before bed. Off-Loading: Other: - float heels while resting in bed or chair. Use a pillow or rolled sheet under legs to offload the heels. Ensure no pressure to right lateral foot. Home Health: No change in wound care orders this week; continue Home Health for wound care. May utilize formulary equivalent dressing for wound treatment orders unless otherwise specified. Other Home Health Orders/Instructions: - [facility] home health weekly changes and all other days family to change. WOUND #3: - Foot Wound Laterality: Right, Lateral Cleanser: Soap and Water Every Other Day/30 Days Discharge Instructions: May shower and wash wound with dial antibacterial soap and water prior to dressing change. Cleanser: Wound Cleanser Every Other Day/30 Days Discharge Instructions: Cleanse the wound with wound cleanser prior to applying a clean dressing using gauze sponges, not tissue or cotton balls. Prim Dressing: Hydrofera Blue Classic Foam, 2x2 in (Generic) Every Other Day/30 Days ary Discharge Instructions: Moisten with saline prior to applying to wound bed Secondary Dressing: Bordered Gauze, 4x4 in (Generic) Every Other Day/30 Days Discharge Instructions: Apply over primary dressing as directed. 06/20/2021: Today, the wound looks very good. It is a little bit smaller. There is some slough accumulation on the surface. I used a curette to debride the slough from the wound surface. We will continue using Hydrofera Blue. Follow-up in 1 week. Electronic Signature(s) Signed: 06/20/2021 10:27:55 AM By: Fredirick Maudlin MD FACS Entered By: Fredirick Maudlin on 06/20/2021  10:27:55 -------------------------------------------------------------------------------- HxROS Details Patient Name: Date of Service: [name] [name] HNA THA N L. 06/20/2021 10:00 A M Medical Record Number: [id number] Patient Account Number: [id number] Date of Birth/Sex: Treating RN: 12-16-1955 (66 y.o. [name] Primary Care Provider: [name], Alyssa Other Clinician: Referring Provider: Treating Provider/Extender: Thana Ates, Alyssa Weeks in Treatment: 56 Information Obtained From Patient Cardiovascular Medical History: Positive for: Coronary Artery Disease; Hypertension Endocrine Medical History: Positive for: Type II Diabetes Treated with: Insulin Blood sugar tested every day: Yes Tested : CGM Musculoskeletal Medical History: Positive for: Osteoarthritis Neurologic Medical History: Positive for: Neuropathy Immunizations Pneumococcal Vaccine: Received Pneumococcal Vaccination: Yes Received Pneumococcal Vaccination On or After 60th Birthday: Yes Implantable Devices None Family and Social History Cancer: Yes - Paternal Grandparents,Father; Diabetes: No; Heart Disease: Yes - Father; Hereditary Spherocytosis: No; Hypertension: Yes - Father; Kidney Disease: No; Lung Disease: No; Seizures: No; Stroke: No; Thyroid Problems: No; Tuberculosis: No; Current some day smoker - Cigars; Marital Status - Divorced; Alcohol Use: Rarely; Drug Use: No History; Caffeine Use: Daily; Financial Concerns: No; Food, Clothing or Shelter Needs: No; Support System Lacking: No; Transportation Concerns: No Electronic Signature(s) Signed: 06/20/2021 11:12:49 AM By: Fredirick Maudlin MD FACS Signed: 06/20/2021 4:52:42 PM By: Adline Peals Entered By: Fredirick Maudlin on 06/20/2021 10:16:12 -------------------------------------------------------------------------------- SuperBill Details Patient Name: Date of Service: [name] [name] HNA THA N L. 06/20/2021 Medical Record Number:  [id number] Patient Account Number: [id number] Date  of Birth/Sex: Treating RN: November 01, 1955 (66 y.o. [name] Primary Care Provider: [name], Alyssa Other Clinician: Referring Provider: Treating Provider/Extender: Thana Ates, Alyssa Weeks in Treatment: 86 Diagnosis Coding ICD-10 Codes Code Description L97.511 Non-pressure chronic ulcer of other part of right foot limited to breakdown of skin E11.621 Type 2 diabetes mellitus with foot ulcer E11.51 Type 2 diabetes mellitus with diabetic peripheral angiopathy without gangrene E11.42 Type 2 diabetes mellitus with diabetic polyneuropathy Facility Procedures CPT4 Code: [id number] Description: 78004 - DEB SUBQ TISSUE 20 SQ CM/< ICD-10 Diagnosis Description L97.511 Non-pressure chronic ulcer of other part of right foot limited to breakdown of Modifier: skin Quantity: 1 Physician Procedures : CPT4 Code Description Modifier [id number] - WC PHYS LEVEL 3 - EST PT 25 ICD-10 Diagnosis Description L97.511 Non-pressure chronic ulcer of other part of right foot limited to breakdown of skin E11.621 Type 2 diabetes mellitus with foot ulcer E11.42  Type 2 diabetes mellitus with diabetic polyneuropathy E11.51 Type 2 diabetes mellitus with diabetic peripheral angiopathy without gangrene Quantity: 1 : [id number] 11042 - WC PHYS SUBQ TISS 20 SQ CM ICD-10 Diagnosis Description L97.511 Non-pressure chronic ulcer of other part of right foot limited to breakdown of skin Quantity: 1 Electronic Signature(s) Signed: 06/20/2021 10:28:14 AM By: Fredirick Maudlin MD FACS Entered By: Fredirick Maudlin on 06/20/2021 10:28:14

## 2021-06-20 NOTE — Progress Notes (Signed)
[name] [name], [name] [name] ([id number]) Visit Report for 06/20/2021 Arrival Information Details Patient Name: Date of Service: [name] [name] [facility] THA N L. 06/20/2021 10:00 A M Medical Record Number: [id number] Patient Account Number: [id number] Date of Birth/Sex: Treating RN: [birth year] (66 y.o. [name] [name] Primary Care [name] [name]: [name], [name] Other Clinician: Referring [name] [name]: Treating [name] [name]/Extender: [name] [name] in Treatment: 34 Visit Information History Since Last Visit Added or deleted any medications: No Patient Arrived: Walker Any new allergies or adverse reactions: No Arrival Time: 10:01 Had a fall or experienced change in No Accompanied By: mother activities of daily living that may affect Transfer Assistance: None risk of falls: Patient Identification Verified: Yes Signs or symptoms of abuse/neglect since last visito No Secondary Verification Process Completed: Yes Hospitalized since last visit: No Patient Requires Transmission-Based Precautions: No Implantable device outside of the clinic excluding No Patient Has Alerts: Yes cellular tissue based products placed in the center Patient Alerts: Patient on Blood Thinner since last visit: Has Dressing in Place as Prescribed: Yes Pain Present Now: Yes Electronic Signature(s) Signed: 06/20/2021 4:52:42 PM By: [name] [name] Entered By: [name] [name] on 06/20/2021 10:04:10 -------------------------------------------------------------------------------- Encounter Discharge Information Details Patient Name: Date of Service: [name] [name] [facility] THA N L. 06/20/2021 10:00 A M Medical Record Number: [id number] Patient Account Number: [id number] Date of Birth/Sex: Treating RN: 1955-02-17 (66 y.o. [name] [name] Primary Care Sherre Wooton: [name], [name] Other Clinician: Referring [name] [name]: Treating [name] [name]/Extender: [name] [name], [name] [name] in Treatment: 41 Encounter  Discharge Information Items Post Procedure Vitals Discharge Condition: Stable Temperature (F): 98.6 Ambulatory Status: Walker Pulse (bpm): 50 Discharge Destination: Home Respiratory Rate (breaths/min): 18 Transportation: Private Auto Blood Pressure (mmHg): 124/69 Accompanied By: mother Schedule Follow-up Appointment: Yes Clinical Summary of Care: Patient Declined Electronic Signature(s) Signed: 06/20/2021 4:52:42 PM By: [name] [name] Entered By: [name] [name] on 06/20/2021 11:42:35 -------------------------------------------------------------------------------- Lower Extremity Assessment Details Patient Name: Date of Service: [name] [name] [facility] THA N L. 06/20/2021 10:00 A M Medical Record Number: [id number] Patient Account Number: [id number] Date of Birth/Sex: Treating RN: Sep 25, 1955 (66 y.o. [name] [name] Primary Care Clodagh Odenthal: [name], [name] Other Clinician: Referring [name] [name]: Treating Toneshia Coello/Extender: [name] [name] [name], [name] [name] in Treatment: 49 Edema Assessment Assessed: [Left: No] [Right: No] Edema: [Left: Ye] [Right: s] Calf Left: Right: Point of Measurement: 34 cm From Medial Instep 31.2 cm Ankle Left: Right: Point of Measurement: 10 cm From Medial Instep 22.3 cm Vascular Assessment Pulses: Dorsalis Pedis Palpable: [Right:Yes] Electronic Signature(s) Signed: 06/20/2021 4:52:42 PM By: [name] [name] Entered By: [name] [name] on 06/20/2021 10:06:54 -------------------------------------------------------------------------------- Multi Wound Chart Details Patient Name: Date of Service: [name] [name] [facility] THA N L. 06/20/2021 10:00 A M Medical Record Number: [id number] Patient Account Number: [id number] Date of Birth/Sex: Treating RN: 1955/03/10 (66 y.o. [name] [name] Primary Care [name] [name]: [name], [name] Other Clinician: Referring Valaria Kohut: Treating [name] [name]/Extender: [name] [name] [name], [name] [name] in  Treatment: 46 Vital Signs Height(in): 74 Capillary Blood Glucose(mg/dl): 189 Weight(lbs): 151 Pulse(bpm): 50 Body Mass Index(BMI): 19.4 Blood Pressure(mmHg): 124/69 Temperature(F): 98.6 Respiratory Rate(breaths/min): 18 Photos: [3:Right, Lateral Foot] [N/A:N/A N/A] Wound Location: [3:Gradually Appeared] [N/A:N/A] Wounding Event: [3:Diabetic Wound/Ulcer of the Lower] [N/A:N/A] Primary Etiology: [3:Extremity Coronary Artery Disease,] [N/A:N/A] Comorbid History: [3:Hypertension, Type II Diabetes, Osteoarthritis, Neuropathy 06/21/2020] [N/A:N/A] Date Acquired: [3:49] [N/A:N/A] [name] of Treatment: [3:Open] [N/A:N/A] Wound Status: [3:No] [N/A:N/A] Wound Recurrence: [3:0.4x0.4x0.1] [N/A:N/A] Measurements L x W x D (cm) [3:0.126] [N/A:N/A] A (cm) : rea [3:0.013] [N/A:N/A] Volume (cm) : [3:93.70%] [N/A:N/A] % Reduction in  A [3:rea: 96.80%] [N/A:N/A] % Reduction in Volume: [3:Grade 2] [N/A:N/A] Classification: [3:Medium] [N/A:N/A] Exudate A mount: [3:Serosanguineous] [N/A:N/A] Exudate Type: [3:red, brown] [N/A:N/A] Exudate Color: [3:Flat and Intact] [N/A:N/A] Wound Margin: [3:Small (1-33%)] [N/A:N/A] Granulation A mount: [3:Red, Pink] [N/A:N/A] Granulation Quality: [3:Large (67-100%)] [N/A:N/A] Necrotic A mount: [3:Fat Layer (Subcutaneous Tissue): Yes N/A] Exposed Structures: [3:Fascia: No Tendon: No Muscle: No Joint: No Bone: No Medium (34-66%)] [N/A:N/A] Epithelialization: [3:Debridement - Excisional] [N/A:N/A] Debridement: Pre-procedure Verification/Time Out 10:11 [N/A:N/A] Taken: [3:Lidocaine 4% Topical Solution] [N/A:N/A] Pain Control: [3:Subcutaneous, Slough] [N/A:N/A] Tissue Debrided: [3:Skin/Subcutaneous Tissue] [N/A:N/A] Level: [3:0.16] [N/A:N/A] Debridement A (sq cm): [3:rea Curette] [N/A:N/A] Instrument: [3:Minimum] [N/A:N/A] Bleeding: [3:Pressure] [N/A:N/A] Hemostasis A chieved: [3:0] [N/A:N/A] Procedural Pain: [3:0] [N/A:N/A] Post Procedural Pain: [3:Procedure  was tolerated well] [N/A:N/A] Debridement Treatment Response: [3:0.4x0.4x0.1] [N/A:N/A] Post Debridement Measurements L x W x D (cm) [3:0.013] [N/A:N/A] Post Debridement Volume: (cm) [3:Debridement] [N/A:N/A] Treatment Notes Electronic Signature(s) Signed: 06/20/2021 10:14:38 AM By: [name] Maudlin MD FACS Signed: 06/20/2021 4:52:42 PM By: [name] [name] Entered By: [name] [name] on 06/20/2021 10:14:38 -------------------------------------------------------------------------------- Multi-Disciplinary Care Plan Details Patient Name: Date of Service: [name] [name] [facility] THA N L. 06/20/2021 10:00 A M Medical Record Number: [id number] Patient Account Number: [id number] Date of Birth/Sex: Treating RN: October 14, 1955 (66 y.o. [name] [name] Primary Care Reniyah Gootee: [name], [name] Other Clinician: Referring Eljay Lave: Treating Darshan Solanki/Extender: [name] [name] in Treatment: [place] reviewed with physician Active Inactive Wound/Skin Impairment Nursing Diagnoses: Knowledge deficit related to ulceration/compromised skin integrity Goals: Patient/caregiver will verbalize understanding of skin care regimen Date Initiated: 07/07/2020 Target Resolution Date: 06/23/2021 Goal Status: Active Interventions: Assess patient/caregiver ability to obtain necessary supplies Assess patient/caregiver ability to perform ulcer/skin care regimen upon admission and as needed Provide education on ulcer and skin care Treatment Activities: Skin care regimen initiated : 07/07/2020 Topical wound management initiated : 07/07/2020 Notes: Electronic Signature(s) Signed: 06/20/2021 4:52:42 PM By: [name] [name] Entered By: [name] [name] on 06/20/2021 10:09:02 -------------------------------------------------------------------------------- Pain Assessment Details Patient Name: Date of Service: [name] [name] [facility] THA N L. 06/20/2021 10:00 A M Medical Record  Number: [id number] Patient Account Number: [id number] Date of Birth/Sex: Treating RN: 1955-05-06 (66 y.o. [name] [name] Primary Care Shilo Pauwels: [name], [name] Other Clinician: Referring Georgia Delsignore: Treating Dauntae Derusha/Extender: [name] [name], [name] [name] in Treatment: 58 Active Problems Location of Pain Severity and Description of Pain Patient Has Paino Yes Site Locations Rate the pain. Current Pain Level: 8 Character of Pain Describe the Pain: Burning Pain Management and Medication Current Pain Management: Medication: No Electronic Signature(s) Signed: 06/20/2021 4:52:42 PM By: [name] [name] Entered By: [name] [name] on 06/20/2021 10:04:28 -------------------------------------------------------------------------------- Patient/Caregiver Education Details Patient Name: Date of Service: [name] [name] [facility] THA N L. 6/20/2023andnbsp10:00 A M Medical Record Number: [id number] Patient Account Number: [id number] Date of Birth/Gender: Treating RN: 1955-04-01 (66 y.o. [name] [name] Primary Care Physician: [name], [name] Other Clinician: Referring Physician: Treating Physician/Extender: [name] [name] in Treatment: 15 Education Assessment Education Provided To: Patient Education Topics Provided Wound/Skin Impairment: Methods: Explain/Verbal Responses: Reinforcements needed, State content correctly Electronic Signature(s) Signed: 06/20/2021 4:52:42 PM By: [name] [name] Entered By: [name] [name] on 06/20/2021 10:09:19 -------------------------------------------------------------------------------- Wound Assessment Details Patient Name: Date of Service: [name] [name] [facility] THA N L. 06/20/2021 10:00 A M Medical Record Number: [id number] Patient Account Number: [id number] Date of Birth/Sex: Treating RN: 02-05-1955 (66 y.o. [name] [name] Primary Care Rollins Wrightson: [name], [name] Other Clinician: Referring  Cleta Heatley: Treating Jakori Burkett/Extender: [name] [name] [name], [name] [name] in Treatment:  49 Wound Status Wound Number: 3 Primary Diabetic Wound/Ulcer of the Lower Extremity Etiology: Wound Location: Right, Lateral Foot Wound Open Wounding Event: Gradually Appeared Status: Date Acquired: 06/21/2020 Comorbid Coronary Artery Disease, Hypertension, Type II Diabetes, [name] Of Treatment: 49 History: Osteoarthritis, Neuropathy Clustered Wound: No Photos Wound Measurements Length: (cm) 0.4 Width: (cm) 0.4 Depth: (cm) 0.1 Area: (cm) 0.126 Volume: (cm) 0.013 % Reduction in Area: 93.7% % Reduction in Volume: 96.8% Epithelialization: Medium (34-66%) Tunneling: No Undermining: No Wound Description Classification: Grade 2 Wound Margin: Flat and Intact Exudate Amount: Medium Exudate Type: Serosanguineous Exudate Color: red, brown Foul Odor After Cleansing: No Slough/Fibrino Yes Wound Bed Granulation Amount: Small (1-33%) Exposed Structure Granulation Quality: Red, Pink Fascia Exposed: No Necrotic Amount: Large (67-100%) Fat Layer (Subcutaneous Tissue) Exposed: Yes Necrotic Quality: Adherent Slough Tendon Exposed: No Muscle Exposed: No Joint Exposed: No Bone Exposed: No Treatment Notes Wound #3 (Foot) Wound Laterality: Right, Lateral Cleanser Soap and Water Discharge Instruction: May shower and wash wound with dial antibacterial soap and water prior to dressing change. Wound Cleanser Discharge Instruction: Cleanse the wound with wound cleanser prior to applying a clean dressing using gauze sponges, not tissue or cotton balls. Peri-Wound Care Topical Primary Dressing Hydrofera Blue Classic Foam, 2x2 in Discharge Instruction: Moisten with saline prior to applying to wound bed Secondary Dressing Bordered Gauze, 4x4 in Discharge Instruction: Apply over primary dressing as directed. Secured With Compression Wrap Compression Stockings Sport and exercise psychologist) Signed: 06/20/2021 4:52:42 PM By: [name] [name] Entered By: [name] [name] on 06/20/2021 10:08:16 -------------------------------------------------------------------------------- Vitals Details Patient Name: Date of Service: [name] [name] [facility] THA N L. 06/20/2021 10:00 A M Medical Record Number: [id number] Patient Account Number: [id number] Date of Birth/Sex: Treating RN: 02/19/1955 (66 y.o. [name] [name] Primary Care Jolynn Bajorek: [name], [name] Other Clinician: Referring Amri Lien: Treating Elchonon Maxson/Extender: [name] [name] [name], [name] [name] in Treatment: 25 Vital Signs Time Taken: 10:05 Temperature (F): 98.6 Height (in): 74 Pulse (bpm): 50 Weight (lbs): 151 Respiratory Rate (breaths/min): 18 Body Mass Index (BMI): 19.4 Blood Pressure (mmHg): 124/69 Capillary Blood Glucose (mg/dl): 189 Reference Range: 80 - 120 mg / dl Electronic Signature(s) Signed: 06/20/2021 4:52:42 PM By: [name] [name] Entered By: [name] [name] on 06/20/2021 10:05:36

## 2021-06-22 DIAGNOSIS — E785 Hyperlipidemia, unspecified: Secondary | ICD-10-CM | POA: Diagnosis not present

## 2021-06-22 DIAGNOSIS — K5904 Chronic idiopathic constipation: Secondary | ICD-10-CM | POA: Diagnosis not present

## 2021-06-22 DIAGNOSIS — E1151 Type 2 diabetes mellitus with diabetic peripheral angiopathy without gangrene: Secondary | ICD-10-CM | POA: Diagnosis not present

## 2021-06-22 DIAGNOSIS — L409 Psoriasis, unspecified: Secondary | ICD-10-CM | POA: Diagnosis not present

## 2021-06-22 DIAGNOSIS — D649 Anemia, unspecified: Secondary | ICD-10-CM | POA: Diagnosis not present

## 2021-06-22 DIAGNOSIS — E1142 Type 2 diabetes mellitus with diabetic polyneuropathy: Secondary | ICD-10-CM | POA: Diagnosis not present

## 2021-06-22 DIAGNOSIS — E113299 Type 2 diabetes mellitus with mild nonproliferative diabetic retinopathy without macular edema, unspecified eye: Secondary | ICD-10-CM | POA: Diagnosis not present

## 2021-06-22 DIAGNOSIS — G8929 Other chronic pain: Secondary | ICD-10-CM | POA: Diagnosis not present

## 2021-06-22 DIAGNOSIS — F17210 Nicotine dependence, cigarettes, uncomplicated: Secondary | ICD-10-CM | POA: Diagnosis not present

## 2021-06-22 DIAGNOSIS — I110 Hypertensive heart disease with heart failure: Secondary | ICD-10-CM | POA: Diagnosis not present

## 2021-06-22 DIAGNOSIS — L97511 Non-pressure chronic ulcer of other part of right foot limited to breakdown of skin: Secondary | ICD-10-CM | POA: Diagnosis not present

## 2021-06-22 DIAGNOSIS — E11621 Type 2 diabetes mellitus with foot ulcer: Secondary | ICD-10-CM | POA: Diagnosis not present

## 2021-06-22 DIAGNOSIS — I480 Paroxysmal atrial fibrillation: Secondary | ICD-10-CM | POA: Diagnosis not present

## 2021-06-22 DIAGNOSIS — F419 Anxiety disorder, unspecified: Secondary | ICD-10-CM | POA: Diagnosis not present

## 2021-06-22 DIAGNOSIS — N401 Enlarged prostate with lower urinary tract symptoms: Secondary | ICD-10-CM | POA: Diagnosis not present

## 2021-06-22 DIAGNOSIS — N138 Other obstructive and reflux uropathy: Secondary | ICD-10-CM | POA: Diagnosis not present

## 2021-06-22 DIAGNOSIS — I509 Heart failure, unspecified: Secondary | ICD-10-CM | POA: Diagnosis not present

## 2021-06-22 DIAGNOSIS — Z794 Long term (current) use of insulin: Secondary | ICD-10-CM | POA: Diagnosis not present

## 2021-06-22 DIAGNOSIS — E559 Vitamin D deficiency, unspecified: Secondary | ICD-10-CM | POA: Diagnosis not present

## 2021-06-22 DIAGNOSIS — I2510 Atherosclerotic heart disease of native coronary artery without angina pectoris: Secondary | ICD-10-CM | POA: Diagnosis not present

## 2021-06-22 DIAGNOSIS — F3341 Major depressive disorder, recurrent, in partial remission: Secondary | ICD-10-CM | POA: Diagnosis not present

## 2021-06-22 DIAGNOSIS — M47812 Spondylosis without myelopathy or radiculopathy, cervical region: Secondary | ICD-10-CM | POA: Diagnosis not present

## 2021-06-22 DIAGNOSIS — M720 Palmar fascial fibromatosis [Dupuytren]: Secondary | ICD-10-CM | POA: Diagnosis not present

## 2021-06-22 DIAGNOSIS — Z7901 Long term (current) use of anticoagulants: Secondary | ICD-10-CM | POA: Diagnosis not present

## 2021-06-25 DIAGNOSIS — L89150 Pressure ulcer of sacral region, unstageable: Secondary | ICD-10-CM | POA: Diagnosis not present

## 2021-06-25 DIAGNOSIS — S42102D Fracture of unspecified part of scapula, left shoulder, subsequent encounter for fracture with routine healing: Secondary | ICD-10-CM | POA: Diagnosis not present

## 2021-06-27 ENCOUNTER — Encounter (HOSPITAL_BASED_OUTPATIENT_CLINIC_OR_DEPARTMENT_OTHER): Payer: HMO | Admitting: General Surgery

## 2021-06-27 DIAGNOSIS — E11621 Type 2 diabetes mellitus with foot ulcer: Secondary | ICD-10-CM | POA: Diagnosis not present

## 2021-06-27 DIAGNOSIS — L97511 Non-pressure chronic ulcer of other part of right foot limited to breakdown of skin: Secondary | ICD-10-CM | POA: Diagnosis not present

## 2021-06-27 DIAGNOSIS — L97512 Non-pressure chronic ulcer of other part of right foot with fat layer exposed: Secondary | ICD-10-CM | POA: Diagnosis not present

## 2021-06-30 ENCOUNTER — Telehealth: Payer: Self-pay

## 2021-06-30 NOTE — Telephone Encounter (Signed)
Spoke with patient's mother and patient [name] [name], which is on [place]) and informed both of them that patient needs to reschedule his procedure and come into the office to get his labs done that he was suppose to have in April. Patients mother states he does not want to have any testing at this time. She states she has encouraged him to have the procedures but patient has denied and states he is doing well at this time. Informed patient's mother that we were also doing the procedure for anemia and it is very important they be done to find out where the blood loss is coming from. Patient's mother states patient does not want to have anything else done at this time. Informed patients mother to have patient call our office if he changes his mind and I will inform Dr. Fuller Plan of his decision.

## 2021-06-30 NOTE — Telephone Encounter (Signed)
-----   Message from Ladene Artist, MD sent at 06/28/2021  3:04 PM EDT ----- Please contact this patient and advise him to complete his blood work from April 2023 and to schedule his colonoscopy and EGD. Thanks.

## 2021-06-30 NOTE — Telephone Encounter (Signed)
Adding his PCP to this message so she is aware. Understood he declines blood work, colonoscopy and EGD at this time.

## 2021-07-03 DIAGNOSIS — I509 Heart failure, unspecified: Secondary | ICD-10-CM | POA: Diagnosis not present

## 2021-07-03 DIAGNOSIS — D649 Anemia, unspecified: Secondary | ICD-10-CM | POA: Diagnosis not present

## 2021-07-03 DIAGNOSIS — M720 Palmar fascial fibromatosis [Dupuytren]: Secondary | ICD-10-CM | POA: Diagnosis not present

## 2021-07-03 DIAGNOSIS — F17210 Nicotine dependence, cigarettes, uncomplicated: Secondary | ICD-10-CM | POA: Diagnosis not present

## 2021-07-03 DIAGNOSIS — E11621 Type 2 diabetes mellitus with foot ulcer: Secondary | ICD-10-CM | POA: Diagnosis not present

## 2021-07-03 DIAGNOSIS — E785 Hyperlipidemia, unspecified: Secondary | ICD-10-CM | POA: Diagnosis not present

## 2021-07-03 DIAGNOSIS — K5904 Chronic idiopathic constipation: Secondary | ICD-10-CM | POA: Diagnosis not present

## 2021-07-03 DIAGNOSIS — F419 Anxiety disorder, unspecified: Secondary | ICD-10-CM | POA: Diagnosis not present

## 2021-07-03 DIAGNOSIS — E1142 Type 2 diabetes mellitus with diabetic polyneuropathy: Secondary | ICD-10-CM | POA: Diagnosis not present

## 2021-07-03 DIAGNOSIS — E113299 Type 2 diabetes mellitus with mild nonproliferative diabetic retinopathy without macular edema, unspecified eye: Secondary | ICD-10-CM | POA: Diagnosis not present

## 2021-07-03 DIAGNOSIS — I2510 Atherosclerotic heart disease of native coronary artery without angina pectoris: Secondary | ICD-10-CM | POA: Diagnosis not present

## 2021-07-03 DIAGNOSIS — M47812 Spondylosis without myelopathy or radiculopathy, cervical region: Secondary | ICD-10-CM | POA: Diagnosis not present

## 2021-07-03 DIAGNOSIS — G8929 Other chronic pain: Secondary | ICD-10-CM | POA: Diagnosis not present

## 2021-07-03 DIAGNOSIS — F3341 Major depressive disorder, recurrent, in partial remission: Secondary | ICD-10-CM | POA: Diagnosis not present

## 2021-07-03 DIAGNOSIS — E559 Vitamin D deficiency, unspecified: Secondary | ICD-10-CM | POA: Diagnosis not present

## 2021-07-03 DIAGNOSIS — I480 Paroxysmal atrial fibrillation: Secondary | ICD-10-CM | POA: Diagnosis not present

## 2021-07-03 DIAGNOSIS — L409 Psoriasis, unspecified: Secondary | ICD-10-CM | POA: Diagnosis not present

## 2021-07-03 DIAGNOSIS — E1151 Type 2 diabetes mellitus with diabetic peripheral angiopathy without gangrene: Secondary | ICD-10-CM | POA: Diagnosis not present

## 2021-07-03 DIAGNOSIS — Z7901 Long term (current) use of anticoagulants: Secondary | ICD-10-CM | POA: Diagnosis not present

## 2021-07-03 DIAGNOSIS — L97511 Non-pressure chronic ulcer of other part of right foot limited to breakdown of skin: Secondary | ICD-10-CM | POA: Diagnosis not present

## 2021-07-03 DIAGNOSIS — N401 Enlarged prostate with lower urinary tract symptoms: Secondary | ICD-10-CM | POA: Diagnosis not present

## 2021-07-03 DIAGNOSIS — I110 Hypertensive heart disease with heart failure: Secondary | ICD-10-CM | POA: Diagnosis not present

## 2021-07-03 DIAGNOSIS — N138 Other obstructive and reflux uropathy: Secondary | ICD-10-CM | POA: Diagnosis not present

## 2021-07-03 DIAGNOSIS — Z794 Long term (current) use of insulin: Secondary | ICD-10-CM | POA: Diagnosis not present

## 2021-07-04 NOTE — Telephone Encounter (Signed)
Noted  

## 2021-07-05 ENCOUNTER — Other Ambulatory Visit: Payer: Self-pay | Admitting: Physician Assistant

## 2021-07-05 ENCOUNTER — Encounter (HOSPITAL_BASED_OUTPATIENT_CLINIC_OR_DEPARTMENT_OTHER): Payer: HMO | Admitting: General Surgery

## 2021-07-05 DIAGNOSIS — I480 Paroxysmal atrial fibrillation: Secondary | ICD-10-CM

## 2021-07-05 NOTE — Progress Notes (Signed)
[facility] Note  07/07/2021     CHIEF COMPLAINT Patient presents for Retina Follow Up  HISTORY OF PRESENT ILLNESS: [name] [name] is a 66 y.o. male who presents to the clinic today for:   HPI     Retina Follow Up   Patient presents with  Diabetic Retinopathy.  In both eyes.  Severity is moderate.  Duration of 4 weeks.  Since onset it is gradually improving.  I, the attending physician,  performed the HPI with the patient and updated documentation appropriately.        Comments   Pt here for 4 wk ret f/u PDR OU. Pt states he is now seeing out of the top of OS, improvement. No other changes reported. Last A1C was taken 9.02 April 2021.       Last edited by [name], MD on 07/07/2021 10:08 AM.    Pt states right eye is doing well, he feels like left eye may have improved after the last injection  Referring physician: [name], [name], PA-C [address] [place],  [place] [address]  HISTORICAL INFORMATION:   Selected notes from the MEDICAL RECORD NUMBER Referred by Dr. [name] for PDR OU   CURRENT MEDICATIONS: No current outpatient medications on file. (Ophthalmic Drugs)   No current facility-administered medications for this visit. (Ophthalmic Drugs)   Current Outpatient Medications (Other)  Medication Sig   apixaban (ELIQUIS) 5 MG TABS tablet Take 1 tablet (5 mg total) by mouth 2 (two) times daily. PLEASE SCHEDULE APPT WITH ALYSSA FOR FURTHER REFILLS [phone]   aspirin EC 81 MG EC tablet Take 1 tablet (81 mg total) by mouth daily.   atorvastatin (LIPITOR) 40 MG tablet Take 1 tablet (40 mg total) by mouth daily.   B Complex Vitamins (B COMPLEX-B12 PO) Take 1 tablet by mouth daily.    BD PEN NEEDLE NANO 2ND GEN 32G X 4 MM MISC USE FOR INSULIN   Blood Glucose Monitoring Suppl (ONETOUCH VERIO FLEX SYSTEM) w/Device KIT    Cholecalciferol (VITAMIN D) 125 MCG (5000 UT) CAPS Take 5,000 Units by mouth daily.    Continuous  Blood Gluc Sensor (DEXCOM G6 SENSOR) MISC 1 Device by Does not apply route See admin instructions. Change every 10 days   Continuous Blood Gluc Transmit (DEXCOM G6 TRANSMITTER) MISC Use as instructed to check blood sugar   furosemide (LASIX) 40 MG tablet Take 1 tablet (40 mg total) by mouth daily.   Incontinence Supply Disposable (DISPOSABLE BRIEF LARGE) MISC 1 each by Does not apply route as needed.   insulin aspart (NOVOLOG) 100 UNIT/ML injection For use in pump, total of 40 units per day   Insulin Disposable Pump (OMNIPOD 5 G6 INTRO, GEN 5,) KIT 1 Device by Does not apply route every 3 (three) days.   Insulin Disposable Pump (OMNIPOD 5 G6 POD, GEN 5,) MISC APPLY EVERY 3 (THREE) DAYS.   Lancets (ONETOUCH DELICA PLUS [id number]) MISC Apply 1 each topically 4 (four) times daily.   loperamide (IMODIUM A-D) 2 MG tablet Take 1 tablet (2 mg total) by mouth 4 (four) times daily as needed for diarrhea or loose stools.   losartan (COZAAR) 25 MG tablet Take 1 tablet (25 mg total) by mouth daily.   mirtazapine (REMERON) 15 MG tablet Take 1 tablet (15 mg total) by mouth at bedtime.   ONETOUCH VERIO test strip 1 each 4 (four) times daily.   PARoxetine (PAXIL) 20 MG tablet Take 1 tablet (  20 mg total) by mouth daily.   spironolactone (ALDACTONE) 25 MG tablet TAKE ONE TABLET BY MOUTH DAILY   No current facility-administered medications for this visit. (Other)   REVIEW OF SYSTEMS: ROS   Positive for: Neurological, Skin, Musculoskeletal, Endocrine, Cardiovascular, Eyes Negative for: Constitutional, Gastrointestinal, Genitourinary, HENT, Respiratory, Psychiatric, Allergic/Imm, Heme/Lymph Last edited by Kingsley Spittle, COT on 07/07/2021  8:45 AM.     ALLERGIES Not on File  PAST MEDICAL HISTORY Past Medical History:  Diagnosis Date   Anxiety    Arthritis    Cataract    CHF (congestive heart failure) ([place])    Constipation    pt states goes EOD- hard stools    Coronary artery disease    Depression     Diabetes mellitus without complication (HCC)    Diabetic retinopathy ([place])    DKA (diabetic ketoacidoses)    Hyperlipidemia    Hypertension    off meds   Hypertensive retinopathy    Psoriasis    Tubular adenoma of colon 03/2019   Past Surgical History:  Procedure Laterality Date   ABDOMINAL AORTOGRAM W/LOWER EXTREMITY N/A 08/30/2020   Procedure: ABDOMINAL AORTOGRAM W/LOWER EXTREMITY;  Surgeon: Serafina Mitchell, MD;  Location: [facility];  Service: Cardiovascular;  Laterality: N/A;   ABDOMINAL AORTOGRAM W/LOWER EXTREMITY N/A 11/15/2020   Procedure: ABDOMINAL AORTOGRAM W/LOWER EXTREMITY;  Surgeon: Serafina Mitchell, MD;  Location: [facility];  Service: Cardiovascular;  Laterality: N/A;   ANKLE SURGERY     right   CARDIAC CATHETERIZATION  09/25/2001   COLONOSCOPY     ~10 yr ago- normal  per pt    CORONARY ARTERY BYPASS GRAFT  09/30/2001   CABG x 4   IR THORACENTESIS ASP PLEURAL SPACE W/IMG GUIDE  05/19/2020   LAPAROSCOPIC INGUINAL HERNIA REPAIR Bilateral 02/09/2010   MASS EXCISION Left 03/12/2013   Procedure: LEFT INDEX EXCISION MASS AND DEBRIDMENT DISTAL INTERPHALANGEAL JOINT;  Surgeon: Tennis Must, MD;  Location: [facility];  Service: Orthopedics;  Laterality: Left;   PERIPHERAL VASCULAR BALLOON ANGIOPLASTY Left 08/30/2020   Procedure: PERIPHERAL VASCULAR BALLOON ANGIOPLASTY;  Surgeon: Serafina Mitchell, MD;  Location: [facility];  Service: Cardiovascular;  Laterality: Left;  Peroneal   FAMILY HISTORY Family History  Problem Relation Age of Onset   Lymphoma Father    Cancer Paternal Grandmother        Colon Cancer   Colon cancer Paternal Grandmother    Colon polyps Neg Hx    Esophageal cancer Neg Hx    Rectal cancer Neg Hx    Stomach cancer Neg Hx    Diabetes Mellitus I Neg Hx    Pancreatic cancer Neg Hx    SOCIAL HISTORY Social History   Tobacco Use   Smoking status: Some Days    Types: Cigars   Smokeless tobacco: Never  Vaping Use    Vaping Use: Never used  Substance Use Topics   Alcohol use: Yes    Comment: daily beer or scotch    Drug use: No       OPHTHALMIC EXAM: Base Eye Exam     Visual Acuity (Snellen - Linear)       Right Left   Dist [place] 20/60 -1 CF at 1'   Dist ph [place] 20/40 NI         Tonometry (Tonopen, 8:54 AM)       Right Left   Pressure 13 14  Pupils       Dark Light Shape React APD   Right 4 3 Round Minimal None   Left 4 3 Round Minimal None         Visual Fields       Left Right   Restrictions Partial outer superior nasal, inferior nasal deficiencies          Extraocular Movement       Right Left    Full, Ortho Full, Ortho         Neuro/Psych     Oriented x3: Yes   Mood/Affect: Normal         Dilation     Both eyes: 1.0% Mydriacyl, 2.5% Phenylephrine @ 8:54 AM           Slit Lamp and Fundus Exam     External Exam       Right Left   External Normal Normal         Slit Lamp Exam       Right Left   Lids/Lashes Dermatochalasis - upper lid, Meibomian gland dysfunction Dermatochalasis - upper lid, Meibomian gland dysfunction   Conjunctiva/Sclera White and quiet White and quiet   Cornea arcus, 2-3+ Punctate epithelial erosions arcus, 2+ Punctate epithelial erosions   Anterior Chamber Deep and quiet Deep and quiet   Iris Round and dilated, No NVI Round and dilated, No NVI   Lens 2-3+ Nuclear sclerosis with brunescence, 2+ Cortical cataract 2-3+ Nuclear sclerosis with brunescence, 2-3+ Cortical cataract   Anterior Vitreous Vitreous syneresis, blood stained vitreous condensations persistent Vitreous syneresis, diffuse VH - concentrated centrally, blood stained vitreous condensations, pre-retinal heme collecting inferiorly -- all improving         Fundus Exam       Right Left   Disc Pink and Sharp, NVD - regressed hazy view, Compact, fibrosis, mild Pallor   C/D Ratio 0.2 not visible   Macula Flat, good foveal reflex, scatterd MA, +cystic  changes/edema temporal fovea and macula -- stably improved hazy view, obscured by VH, grossly attached, +fibrosis   Vessels +NVE just inside ST arcades - improved, +fibrosis, attenuated, Tortuous Severe attenuation, Tortuous, +NVD; NVE along ST arcades   Periphery Attached; scattered DBH, good 360 PRP with room for fill in Hazy view, Grossly attached with good PRP visible           IMAGING AND PROCEDURES  Imaging and Procedures for 07/07/2021  OCT, Retina - OU - Both Eyes       Right Eye Quality was good. Central Foveal Thickness: 242. Progression has been stable. Findings include no SRF, abnormal foveal contour, intraretinal hyper-reflective material, intraretinal fluid, vitreous traction, preretinal fibrosis (Partial PVD, persistent cystic changes temporal macula and fovea, persistent PRF with traction).   Left Eye Quality was poor. Progression has worsened. Findings include no SRF, intraretinal hyper-reflective material, intraretinal fluid, vitreous traction (Persistent severe vitreous opacities, interval progression of vitreous traction).   Notes *Images captured and stored on drive  Diagnosis / Impression:  PDR with DME OU OD: Partial PVD, Partial PVD, persistent cystic changes temporal macula and fovea, persistent PRF with traction OS: Persistent severe vitreous opacities, interval progression of vitreous traction  Clinical management:  See below  Abbreviations: NFP - Normal foveal profile. CME - cystoid macular edema. PED - pigment epithelial detachment. IRF - intraretinal fluid. SRF - subretinal fluid. EZ - ellipsoid zone. ERM - epiretinal membrane. ORA - outer retinal atrophy. ORT - outer retinal tubulation. SRHM - subretinal hyper-reflective material.  IRHM - intraretinal hyper-reflective material      Intravitreal Injection, Pharmacologic Agent - OD - Right Eye       Time Out 07/07/2021. 9:13 AM. Confirmed correct patient, procedure, site, and patient consented.    Anesthesia Topical anesthesia was used. Anesthetic medications included Lidocaine 2%, Proparacaine 0.5%.   Procedure Preparation included 5% betadine to ocular surface, eyelid speculum. A supplied (32g) needle was used.   Injection: 1.25 mg Bevacizumab 1.4m/0.05ml   Route: Intravitreal, Site: Right Eye   NDC: [id number] Lot:: [id number] Expiration date: 09/19/2021   Post-op Post injection exam found visual acuity of at least counting fingers. The patient tolerated the procedure well. There were no complications. The patient received written and verbal post procedure care education. Post injection medications were not given.      Intravitreal Injection, Pharmacologic Agent - OS - Left Eye       Time Out 07/07/2021. 9:13 AM. Confirmed correct patient, procedure, site, and patient consented.   Anesthesia Topical anesthesia was used. Anesthetic medications included Lidocaine 2%, Proparacaine 0.5%.   Procedure Preparation included 5% betadine to ocular surface, eyelid speculum. A (32 g) needle was used.   Injection: 1.25 mg Bevacizumab 1.253m0.05ml   Route: Intravitreal, Site: Left Eye   NDC: : 58099-833-82Lot: : [id number] date: 08/28/2021   Post-op Post injection exam found visual acuity of at least counting fingers. The patient tolerated the procedure well. There were no complications. The patient received written and verbal post procedure care education. Post injection medications were not given.            ASSESSMENT/PLAN:    ICD-10-CM   1. Proliferative diabetic retinopathy of both eyes with macular edema associated with type 2 diabetes mellitus (HCC)  E11.3513 OCT, Retina - OU - Both Eyes    Intravitreal Injection, Pharmacologic Agent - OD - Right Eye    Intravitreal Injection, Pharmacologic Agent - OS - Left Eye    Bevacizumab (AVASTIN) SOLN 1.25 mg    Bevacizumab (AVASTIN) SOLN 1.25 mg    2. Vitreous hemorrhage of left eye (HCC)  H43.12     3. Essential  hypertension  I10     4. Hypertensive retinopathy of both eyes  H35.033     5. Combined forms of age-related cataract of both eyes  H25.813      1. Severe proliferative diabetic retinopathy, both eyes  - last A1c 9.2 on 4.20.23; A1c >15% on 10.11.22 - s/p PRP OS (11.10.22)  - s/p PRP OD (12.02.22)  - s/p IVA OS #1 (01.18.23) for VH, #2 (03.17.23), #3 (04.14.23), #4 (05.12.23), #5 (06.09.23)  - s/p IVA OD #1 (01.27.23) for macular edema/NVD, #2 (03.17.23), #3 (04.14.23), #4 (05.12.23), #5 (06.09.23) - pt reports improved BG control - exam shows extensive neovascularization, fibrosis and hemorrhage OU (OS >>> OD)  - OD with improved NVD / VH - OS with persistent, diffuse VH -- minimal improvement - BCVA 20/40 OD - stable, CF 1' OS - FA (11.09.22) shows areas of vascular non-perfusion OU, +NVD OU and +NVE OU - OCT shows OD: Partial PVD, persistent cystic changes temporal macula and fovea, persistent PRF with traction; OS: Persistent severe vitreous opacities, interval progression of vitreous traction - discussed findings, severity of his disease, prognosis and likely need for extensive treatments -- PRP OU, multiple rounds of anti-VEGF therapy OU and possible surgery - recommend IVA OU #6 today, 07.07.23 for PDR OU - pt wishes to proceed with injections - RBA of procedure discussed, questions answered -  informed consent obtained and signed, 01.18.23 - see procedure note - F/u July 21  2. Vitreous hemorrhage OS  - acute increase in VH OS (late Dec 2022) per pt report  - s/p IVA OS as above  - VH remains diffuse and now with increasing vitreous traction  - recommend PPV w/ membrane peel OS under general anesthesia - RBA of procedure discussed, questions answered - informed consent obtained and signed  - surgery scheduled for July 20 at 11:30am. [facility] OR 8  - pt has been has been cleared by cardiology to undergo surgery -- will send surgical clearance request to PCP and cardiologist  -  will schedule with Anesthesia for pre-op eval - VH precautions reviewed -- minimize activities, keep head elevated, avoid ASA/NSAIDs/blood thinners as above - f/u July 21 -- POV  3,4. Hypertensive retinopathy OU - discussed importance of tight BP control - monitor   5. Mixed Cataract OU - The symptoms of cataract, surgical options, and treatments and risks were discussed with patient - discussed diagnosis and progression - monitor   Ophthalmic Meds Ordered this visit:  Meds ordered this encounter  Medications   Bevacizumab (AVASTIN) SOLN 1.25 mg   Bevacizumab (AVASTIN) SOLN 1.25 mg     Return in about 2 weeks (around 07/21/2021) for f/u NPDR OU, DFE, OCT.  There are no Patient Instructions on file for this visit.  Explained the diagnoses, plan, and follow up with the patient and they expressed understanding.  Patient expressed understanding of the importance of proper follow up care.   This document serves as a record of services personally performed by [name], MD, PhD. It was created on their behalf by Leonie Douglas, an ophthalmic technician. The creation of this record is the provider's dictation and/or activities during the visit.    Electronically signed by: Leonie Douglas COA, 07/07/21  3:13 PM  This document serves as a record of services personally performed by [name], MD, PhD. It was created on their behalf by San Jetty. Owens Shark, OA an ophthalmic technician. The creation of this record is the provider's dictation and/or activities during the visit.    Electronically signed by: San Jetty. Owens Shark, [place] 07.07.2023 3:13 PM  [name], M.D., Ph.D. Diseases & Surgery of the Retina and Vitreous [facility]  I have reviewed the above documentation for accuracy and completeness, and I agree with the above. [name], M.D., Ph.D. 07/07/21 3:13 PM   Abbreviations: M myopia (nearsighted); A astigmatism; H hyperopia (farsighted); P  presbyopia; Mrx spectacle prescription;  CTL contact lenses; OD right eye; OS left eye; OU both eyes  XT exotropia; ET esotropia; PEK punctate epithelial keratitis; PEE punctate epithelial erosions; DES dry eye syndrome; MGD meibomian gland dysfunction; ATs artificial tears; PFAT's preservative free artificial tears; [place] nuclear sclerotic cataract; PSC posterior subcapsular cataract; ERM epi-retinal membrane; PVD posterior vitreous detachment; RD retinal detachment; DM diabetes mellitus; DR diabetic retinopathy; NPDR non-proliferative diabetic retinopathy; PDR proliferative diabetic retinopathy; CSME clinically significant macular edema; DME diabetic macular edema; dbh dot blot hemorrhages; CWS cotton wool spot; POAG primary open angle glaucoma; C/D cup-to-disc ratio; HVF humphrey visual field; GVF goldmann visual field; OCT optical coherence tomography; IOP intraocular pressure; BRVO Branch retinal vein occlusion; CRVO central retinal vein occlusion; CRAO central retinal artery occlusion; BRAO branch retinal artery occlusion; RT retinal tear; SB scleral buckle; PPV pars plana vitrectomy; VH Vitreous hemorrhage; PRP panretinal laser photocoagulation; IVK intravitreal kenalog; VMT vitreomacular traction; MH  Macular hole;  NVD neovascularization of the disc; NVE neovascularization elsewhere; AREDS age related eye disease study; ARMD age related macular degeneration; POAG primary open angle glaucoma; EBMD epithelial/anterior basement membrane dystrophy; ACIOL anterior chamber intraocular lens; IOL intraocular lens; PCIOL posterior chamber intraocular lens; Phaco/IOL phacoemulsification with intraocular lens placement; [place] photorefractive keratectomy; LASIK laser assisted in situ keratomileusis; HTN hypertension; DM diabetes mellitus; COPD chronic obstructive pulmonary disease

## 2021-07-07 ENCOUNTER — Encounter (INDEPENDENT_AMBULATORY_CARE_PROVIDER_SITE_OTHER): Payer: Self-pay | Admitting: Ophthalmology

## 2021-07-07 ENCOUNTER — Ambulatory Visit (INDEPENDENT_AMBULATORY_CARE_PROVIDER_SITE_OTHER): Payer: HMO | Admitting: Ophthalmology

## 2021-07-07 DIAGNOSIS — E109 Type 1 diabetes mellitus without complications: Secondary | ICD-10-CM | POA: Diagnosis not present

## 2021-07-07 DIAGNOSIS — I5030 Unspecified diastolic (congestive) heart failure: Secondary | ICD-10-CM | POA: Diagnosis not present

## 2021-07-07 DIAGNOSIS — H35033 Hypertensive retinopathy, bilateral: Secondary | ICD-10-CM

## 2021-07-07 DIAGNOSIS — E113513 Type 2 diabetes mellitus with proliferative diabetic retinopathy with macular edema, bilateral: Secondary | ICD-10-CM

## 2021-07-07 DIAGNOSIS — H4312 Vitreous hemorrhage, left eye: Secondary | ICD-10-CM

## 2021-07-07 DIAGNOSIS — K5900 Constipation, unspecified: Secondary | ICD-10-CM | POA: Diagnosis not present

## 2021-07-07 DIAGNOSIS — Z9641 Presence of insulin pump (external) (internal): Secondary | ICD-10-CM | POA: Diagnosis not present

## 2021-07-07 DIAGNOSIS — H25813 Combined forms of age-related cataract, bilateral: Secondary | ICD-10-CM | POA: Diagnosis not present

## 2021-07-07 DIAGNOSIS — I10 Essential (primary) hypertension: Secondary | ICD-10-CM | POA: Diagnosis not present

## 2021-07-07 DIAGNOSIS — Z6824 Body mass index (BMI) 24.0-24.9, adult: Secondary | ICD-10-CM | POA: Diagnosis not present

## 2021-07-07 MED ORDER — BEVACIZUMAB CHEMO INJECTION 1.25MG/0.05ML SYRINGE FOR KALEIDOSCOPE
1.2500 mg | INTRAVITREAL | Status: AC | PRN
  Administered 2021-07-07: 1.25 mg via INTRAVITREAL

## 2021-07-08 ENCOUNTER — Other Ambulatory Visit: Payer: Self-pay | Admitting: Internal Medicine

## 2021-07-08 DIAGNOSIS — F3341 Major depressive disorder, recurrent, in partial remission: Secondary | ICD-10-CM

## 2021-07-12 ENCOUNTER — Ambulatory Visit (INDEPENDENT_AMBULATORY_CARE_PROVIDER_SITE_OTHER): Payer: HMO | Admitting: Podiatry

## 2021-07-12 ENCOUNTER — Encounter: Payer: Self-pay | Admitting: Podiatry

## 2021-07-12 ENCOUNTER — Other Ambulatory Visit: Payer: Self-pay

## 2021-07-12 ENCOUNTER — Telehealth: Payer: Self-pay | Admitting: Physician Assistant

## 2021-07-12 ENCOUNTER — Encounter (HOSPITAL_BASED_OUTPATIENT_CLINIC_OR_DEPARTMENT_OTHER): Payer: HMO | Attending: General Surgery | Admitting: General Surgery

## 2021-07-12 DIAGNOSIS — L97511 Non-pressure chronic ulcer of other part of right foot limited to breakdown of skin: Secondary | ICD-10-CM | POA: Insufficient documentation

## 2021-07-12 DIAGNOSIS — D689 Coagulation defect, unspecified: Secondary | ICD-10-CM

## 2021-07-12 DIAGNOSIS — L97512 Non-pressure chronic ulcer of other part of right foot with fat layer exposed: Secondary | ICD-10-CM | POA: Diagnosis not present

## 2021-07-12 DIAGNOSIS — E1151 Type 2 diabetes mellitus with diabetic peripheral angiopathy without gangrene: Secondary | ICD-10-CM | POA: Insufficient documentation

## 2021-07-12 DIAGNOSIS — E109 Type 1 diabetes mellitus without complications: Secondary | ICD-10-CM

## 2021-07-12 DIAGNOSIS — E11621 Type 2 diabetes mellitus with foot ulcer: Secondary | ICD-10-CM | POA: Diagnosis not present

## 2021-07-12 DIAGNOSIS — E11622 Type 2 diabetes mellitus with other skin ulcer: Secondary | ICD-10-CM | POA: Diagnosis not present

## 2021-07-12 DIAGNOSIS — M79675 Pain in left toe(s): Secondary | ICD-10-CM

## 2021-07-12 DIAGNOSIS — M79674 Pain in right toe(s): Secondary | ICD-10-CM | POA: Diagnosis not present

## 2021-07-12 DIAGNOSIS — F3341 Major depressive disorder, recurrent, in partial remission: Secondary | ICD-10-CM

## 2021-07-12 DIAGNOSIS — E1142 Type 2 diabetes mellitus with diabetic polyneuropathy: Secondary | ICD-10-CM | POA: Insufficient documentation

## 2021-07-12 DIAGNOSIS — B351 Tinea unguium: Secondary | ICD-10-CM

## 2021-07-12 DIAGNOSIS — I739 Peripheral vascular disease, unspecified: Secondary | ICD-10-CM

## 2021-07-12 MED ORDER — MIRTAZAPINE 15 MG PO TABS: 15.0000 mg | ORAL_TABLET | Freq: Every day | ORAL | 1 refills | Status: DC

## 2021-07-12 MED ORDER — PAROXETINE HCL 20 MG PO TABS: 20.0000 mg | ORAL_TABLET | Freq: Every day | ORAL | 1 refills | Status: DC

## 2021-07-12 NOTE — Progress Notes (Signed)
[name] [name], [name] [name] ([id number]) Visit Report for 07/12/2021 Arrival Information Details Patient Name: Date of Service: [name] [name] [facility] THA N L. 07/12/2021 8:30 A M Medical Record Number: [id number] Patient Account Number: [id number] Date of Birth/Sex: Treating RN: 10/03/1955 (66 y.o. [name] Primary Care Ronnita Paz: [name], Alyssa Other Clinician: Referring Janyra Barillas: Treating Richar Dunklee/Extender: Orland Jarred in Treatment: 18 Visit Information History Since Last Visit Added or deleted any medications: No Patient Arrived: Walker Any new allergies or adverse reactions: No Arrival Time: 08:31 Had a fall or experienced change in No Accompanied By: mother activities of daily living that may affect Transfer Assistance: None risk of falls: Patient Requires Transmission-Based Precautions: No Signs or symptoms of abuse/neglect since last visito No Patient Has Alerts: Yes Hospitalized since last visit: No Patient Alerts: Patient on Blood Thinner Implantable device outside of the clinic excluding No cellular tissue based products placed in the center since last visit: Has Dressing in Place as Prescribed: Yes Pain Present Now: Yes Electronic Signature(s) Signed: 07/12/2021 5:44:21 PM By: Adline Peals Entered By: Adline Peals on 07/12/2021 08:32:08 -------------------------------------------------------------------------------- Encounter Discharge Information Details Patient Name: Date of Service: [name] [name] [facility] THA N L. 07/12/2021 8:30 A M Medical Record Number: [id number] Patient Account Number: [id number] Date of Birth/Sex: Treating RN: 06-07-1955 (66 y.o. [name] Primary Care Aerie Donica: [name], Alyssa Other Clinician: Referring Mattea Seger: Treating Harriette Tovey/Extender: Thana Ates, Alyssa Weeks in Treatment: 43 Encounter Discharge Information Items Post Procedure Vitals Discharge Condition: Stable Temperature  (F): 98.4 Ambulatory Status: Walker Pulse (bpm): 58 Discharge Destination: Home Respiratory Rate (breaths/min): 18 Transportation: Private Auto Blood Pressure (mmHg): 92/53 Accompanied By: mother Schedule Follow-up Appointment: Yes Clinical Summary of Care: Patient Declined Electronic Signature(s) Signed: 07/12/2021 5:44:21 PM By: Adline Peals Entered By: Adline Peals on 07/12/2021 08:49:11 -------------------------------------------------------------------------------- Lower Extremity Assessment Details Patient Name: Date of Service: [name] [name] [facility] N [facility]. 07/12/2021 8:30 A M Medical Record Number: [id number] Patient Account Number: [id number] Date of Birth/Sex: Treating RN: July 29, 1955 (66 y.o. [name] Primary Care Niti Leisure: [name], Alyssa Other Clinician: Referring Taron Conrey: Treating Lerone Onder/Extender: Fredirick Maudlin [name], Alyssa Weeks in Treatment: 52 Edema Assessment Assessed: [Left: No] [Right: No] Edema: [Left: Ye] [Right: s] Calf Left: Right: Point of Measurement: 34 cm From Medial Instep 30.8 cm Ankle Left: Right: Point of Measurement: 10 cm From Medial Instep 23.4 cm Vascular Assessment Pulses: Dorsalis Pedis Palpable: [Right:Yes] Electronic Signature(s) Signed: 07/12/2021 5:44:21 PM By: Adline Peals Entered By: Adline Peals on 07/12/2021 08:35:38 -------------------------------------------------------------------------------- Multi Wound Chart Details Patient Name: Date of Service: [name] [name] [facility] N [facility]. 07/12/2021 8:30 A M Medical Record Number: [id number] Patient Account Number: [id number] Date of Birth/Sex: Treating RN: 01/08/1955 (66 y.o. [name] Primary Care Rasheida Broden: [name], Alyssa Other Clinician: Referring Carrine Kroboth: Treating Mychael Smock/Extender: Fredirick Maudlin [name], Alyssa Weeks in Treatment: 27 Vital Signs Height(in): 74 Pulse(bpm): 50 Weight(lbs): 151 Blood Pressure(mmHg):  92/53 Body Mass Index(BMI): 19.4 Temperature(F): 98.4 Respiratory Rate(breaths/min): 16 Photos: [3:Right, Lateral Foot] [N/A:N/A N/A] Wound Location: [3:Gradually Appeared] [N/A:N/A] Wounding Event: [3:Diabetic Wound/Ulcer of the Lower] [N/A:N/A] Primary Etiology: [3:Extremity Coronary Artery Disease,] [N/A:N/A] Comorbid History: [3:Hypertension, Type II Diabetes, Osteoarthritis, Neuropathy 06/21/2020] [N/A:N/A] Date Acquired: [3:52] [N/A:N/A] Weeks of Treatment: [3:Open] [N/A:N/A] Wound Status: [3:No] [N/A:N/A] Wound Recurrence: [3:0.5x0.4x0.1] [N/A:N/A] Measurements L x W x D (cm) [3:0.157] [N/A:N/A] A (cm) : rea [3:0.016] [N/A:N/A] Volume (cm) : [3:92.20%] [N/A:N/A] % Reduction in A [3:rea: 96.00%] [N/A:N/A] % Reduction in Volume: [3:Grade 2] [N/A:N/A] Classification: [3:Medium] [  N/A:N/A] Exudate A mount: [3:Serosanguineous] [N/A:N/A] Exudate Type: [3:red, brown] [N/A:N/A] Exudate Color: [3:Flat and Intact] [N/A:N/A] Wound Margin: [3:Large (67-100%)] [N/A:N/A] Granulation A mount: [3:Red, Pink] [N/A:N/A] Granulation Quality: [3:Small (1-33%)] [N/A:N/A] Necrotic A mount: [3:Fat Layer (Subcutaneous Tissue): Yes N/A] Exposed Structures: [3:Fascia: No Tendon: No Muscle: No Joint: No Bone: No Medium (34-66%)] [N/A:N/A] Epithelialization: [3:Debridement - Excisional] [N/A:N/A] Debridement: Pre-procedure Verification/Time Out 08:39 [N/A:N/A] Taken: [3:Lidocaine 4% Topical Solution] [N/A:N/A] Pain Control: [3:Subcutaneous, Slough] [N/A:N/A] Tissue Debrided: [3:Skin/Subcutaneous Tissue] [N/A:N/A] Level: [3:0.2] [N/A:N/A] Debridement A (sq cm): [3:rea Curette] [N/A:N/A] Instrument: [3:Minimum] [N/A:N/A] Bleeding: [3:Pressure] [N/A:N/A] Hemostasis A chieved: [3:0] [N/A:N/A] Procedural Pain: [3:0] [N/A:N/A] Post Procedural Pain: [3:Procedure was tolerated well] [N/A:N/A] Debridement Treatment Response: [3:0.5x0.4x0.1] [N/A:N/A] Post Debridement Measurements L x W x D (cm)  [3:0.016] [N/A:N/A] Post Debridement Volume: (cm) [3:Debridement] [N/A:N/A] Treatment Notes Wound #3 (Foot) Wound Laterality: Right, Lateral Cleanser Soap and Water Discharge Instruction: May shower and wash wound with dial antibacterial soap and water prior to dressing change. Wound Cleanser Discharge Instruction: Cleanse the wound with wound cleanser prior to applying a clean dressing using gauze sponges, not tissue or cotton balls. Peri-Wound Care Topical Primary Dressing Promogran Prisma Matrix, 4.34 (sq in) (silver collagen) Discharge Instruction: Moisten collagen with saline or hydrogel Secondary Dressing Bordered Gauze, 2x2 in Discharge Instruction: Apply over primary dressing as directed. Secured With Compression Wrap Compression Stockings Environmental education officer) Signed: 07/12/2021 9:08:50 AM By: Fredirick Maudlin MD FACS Signed: 07/12/2021 5:44:21 PM By: Sabas Sous By: Fredirick Maudlin on 07/12/2021 09:08:50 -------------------------------------------------------------------------------- Multi-Disciplinary Care Plan Details Patient Name: Date of Service: [name] [name] [facility] N [facility]. 07/12/2021 8:30 A M Medical Record Number: [id number] Patient Account Number: [id number] Date of Birth/Sex: Treating RN: 06/23/1955 (66 y.o. [name] Primary Care Adaira Centola: [name], Alyssa Other Clinician: Referring Saryna Kneeland: Treating Sai Moura/Extender: Orland Jarred in Treatment: [place] reviewed with physician Active Inactive Wound/Skin Impairment Nursing Diagnoses: Knowledge deficit related to ulceration/compromised skin integrity Goals: Patient/caregiver will verbalize understanding of skin care regimen Date Initiated: 07/07/2020 Target Resolution Date: 07/21/2021 Goal Status: Active Interventions: Assess patient/caregiver ability to obtain necessary supplies Assess patient/caregiver ability to  perform ulcer/skin care regimen upon admission and as needed Provide education on ulcer and skin care Treatment Activities: Skin care regimen initiated : 07/07/2020 Topical wound management initiated : 07/07/2020 Notes: Electronic Signature(s) Signed: 07/12/2021 5:44:21 PM By: Adline Peals Entered By: Adline Peals on 07/12/2021 08:38:48 -------------------------------------------------------------------------------- Pain Assessment Details Patient Name: Date of Service: [name] [name] [facility]. 07/12/2021 8:30 A M Medical Record Number: [id number] Patient Account Number: [id number] Date of Birth/Sex: Treating RN: 05-31-1955 (66 y.o. [name] Primary Care Maite Burlison: [name], Alyssa Other Clinician: Referring Mishal Probert: Treating Kaliana Albino/Extender: Thana Ates, Alyssa Weeks in Treatment: 76 Active Problems Location of Pain Severity and Description of Pain Patient Has Paino Yes Site Locations Duration of the Pain. Duration of the Pain. Constant / Intermittento Constant Rate the pain. Current Pain Level: 7 Character of Pain Describe the Pain: Aching Pain Management and Medication Current Pain Management: Medication: Yes Electronic Signature(s) Signed: 07/12/2021 5:44:21 PM By: Adline Peals Entered By: Adline Peals on 07/12/2021 08:32:24 -------------------------------------------------------------------------------- Patient/Caregiver Education Details Patient Name: Date of Service: [name] [name] [facility] TH[facility] N L. 7/12/2023andnbsp8:30 A M Medical Record Number: [id number] Patient Account Number: [id number] Date of Birth/Gender: Treating RN: 20-Jan-1955 (66 y.o. [name] Primary Care Physician: [name], Alyssa Other Clinician: Referring Physician: Treating Physician/Extender: Fredirick Maudlin [name], Alyssa Weeks in Treatment: 37  Education Assessment Education Provided To: Patient Education Topics Provided Wound/Skin  Impairment: Methods: Explain/Verbal Responses: Reinforcements needed, State content correctly Electronic Signature(s) Signed: 07/12/2021 5:44:21 PM By: Adline Peals Entered By: Adline Peals on 07/12/2021 08:38:40 -------------------------------------------------------------------------------- Wound Assessment Details Patient Name: Date of Service: [name] [name] [facility]. 07/12/2021 8:30 A M Medical Record Number: [id number] Patient Account Number: [id number] Date of Birth/Sex: Treating RN: Feb 26, 1955 (66 y.o. [name] Primary Care Vannia Pola: [name], Alyssa Other Clinician: Referring Kyrian Stage: Treating Athene Schuhmacher/Extender: Fredirick Maudlin [name], Alyssa Weeks in Treatment: 12 Wound Status Wound Number: 3 Primary Diabetic Wound/Ulcer of the Lower Extremity Etiology: Wound Location: Right, Lateral Foot Wound Open Wounding Event: Gradually Appeared Status: Date Acquired: 06/21/2020 Comorbid Coronary Artery Disease, Hypertension, Type II Diabetes, Weeks Of Treatment: 52 History: Osteoarthritis, Neuropathy Clustered Wound: No Photos Wound Measurements Length: (cm) 0.5 Width: (cm) 0.4 Depth: (cm) 0.1 Area: (cm) 0.157 Volume: (cm) 0.016 % Reduction in Area: 92.2% % Reduction in Volume: 96% Epithelialization: Medium (34-66%) Tunneling: No Undermining: No Wound Description Classification: Grade 2 Wound Margin: Flat and Intact Exudate Amount: Medium Exudate Type: Serosanguineous Exudate Color: red, brown Foul Odor After Cleansing: No Slough/Fibrino Yes Wound Bed Granulation Amount: Large (67-100%) Exposed Structure Granulation Quality: Red, Pink Fascia Exposed: No Necrotic Amount: Small (1-33%) Fat Layer (Subcutaneous Tissue) Exposed: Yes Necrotic Quality: Adherent Slough Tendon Exposed: No Muscle Exposed: No Joint Exposed: No Bone Exposed: No Treatment Notes Wound #3 (Foot) Wound Laterality: Right, Lateral Cleanser Soap and  Water Discharge Instruction: May shower and wash wound with dial antibacterial soap and water prior to dressing change. Wound Cleanser Discharge Instruction: Cleanse the wound with wound cleanser prior to applying a clean dressing using gauze sponges, not tissue or cotton balls. Peri-Wound Care Topical Primary Dressing Promogran Prisma Matrix, 4.34 (sq in) (silver collagen) Discharge Instruction: Moisten collagen with saline or hydrogel Secondary Dressing Bordered Gauze, 2x2 in Discharge Instruction: Apply over primary dressing as directed. Secured With Compression Wrap Compression Stockings Environmental education officer) Signed: 07/12/2021 5:41:05 PM By: Dellie Catholic RN Signed: 07/12/2021 5:44:21 PM By: Adline Peals Entered By: Dellie Catholic on 07/12/2021 08:36:41 -------------------------------------------------------------------------------- Vitals Details Patient Name: Date of Service: [name] [name] [facility] N L. 07/12/2021 8:30 A M Medical Record Number: [id number] Patient Account Number: [id number] Date of Birth/Sex: Treating RN: 02-04-1955 (66 y.o. [name] Primary Care Hipolito Martinezlopez: [name], Alyssa Other Clinician: Referring Karoline Fleer: Treating Kelsen Celona/Extender: Fredirick Maudlin [name], Alyssa Weeks in Treatment: 92 Vital Signs Time Taken: 08:32 Temperature (F): 98.4 Height (in): 74 Pulse (bpm): 58 Weight (lbs): 151 Respiratory Rate (breaths/min): 16 Body Mass Index (BMI): 19.4 Blood Pressure (mmHg): 92/53 Reference Range: 80 - 120 mg / dl Electronic Signature(s) Signed: 07/12/2021 5:44:21 PM By: Adline Peals Entered By: Adline Peals on 07/12/2021 08:32:56

## 2021-07-12 NOTE — Telephone Encounter (Signed)
..   Encourage patient to contact the pharmacy for refills or they can request refills through [facility]:  Please schedule appointment if longer than 1 year 02/24/21  NEXT APPOINTMENT DATE: 08/08/21  MEDICATION:  PARoxetine (PAXIL) 20 MG tablet  AND  mirtazapine (REMERON) 15 MG tablet  Is the patient out of medication? Yes  [facility]  [id number]  [phone] Fax:  [phone]   States no longer uses [facility] [place] Let patient know to contact pharmacy at the end of the day to make sure medication is ready.  Please notify patient to allow 48-72 hours to process

## 2021-07-12 NOTE — Progress Notes (Signed)
MATTHE, SLOANE ([id number]) Visit Report for 07/12/2021 Chief Complaint Document Details Patient Name: Date of Service: [name] [name] [facility] THA N L. 07/12/2021 8:30 A M Medical Record Number: [id number] Patient Account Number: [id number] Date of Birth/Sex: Treating RN: May 27, 1955 (66 y.o. [name] Primary Care Provider: [name], Alyssa Other Clinician: Referring Provider: Treating Provider/Extender: Orland Jarred in Treatment: 93 Information Obtained from: Patient Chief Complaint 07/07/2020; patient is in clinic for today for review of pressure ulcers on his coccyx and left heel and diabetic foot ulcers bilaterally. Electronic Signature(s) Signed: 07/12/2021 9:08:56 AM By: Fredirick Maudlin MD FACS Entered By: Fredirick Maudlin on 07/12/2021 09:08:56 -------------------------------------------------------------------------------- Debridement Details Patient Name: Date of Service: [name] [name] [facility] THA N L. 07/12/2021 8:30 A M Medical Record Number: [id number] Patient Account Number: [id number] Date of Birth/Sex: Treating RN: 1955/08/24 (66 y.o. [name] Primary Care Provider: [name], Alyssa Other Clinician: Referring Provider: Treating Provider/Extender: Orland Jarred in Treatment: 52 Debridement Performed for Assessment: Wound #3 Right,Lateral Foot Performed By: Physician Fredirick Maudlin, MD Debridement Type: Debridement Severity of Tissue Pre Debridement: Fat layer exposed Level of Consciousness (Pre-procedure): Awake and Alert Pre-procedure Verification/Time Out Yes - 08:39 Taken: Start Time: 08:39 Pain Control: Lidocaine 4% T opical Solution T Area Debrided (L x W): otal 0.5 (cm) x 0.4 (cm) = 0.2 (cm) Tissue and other material debrided: Non-Viable, Slough, Subcutaneous, Slough Level: Skin/Subcutaneous Tissue Debridement Description: Excisional Instrument: Curette Bleeding: Minimum Hemostasis  Achieved: Pressure Procedural Pain: 0 Post Procedural Pain: 0 Response to Treatment: Procedure was tolerated well Level of Consciousness (Post- Awake and Alert procedure): Post Debridement Measurements of Total Wound Length: (cm) 0.5 Width: (cm) 0.4 Depth: (cm) 0.1 Volume: (cm) 0.016 Character of Wound/Ulcer Post Debridement: Improved Severity of Tissue Post Debridement: Fat layer exposed Post Procedure Diagnosis Same as Pre-procedure Electronic Signature(s) Signed: 07/12/2021 9:14:37 AM By: Fredirick Maudlin MD FACS Signed: 07/12/2021 5:44:21 PM By: Adline Peals Entered By: Adline Peals on 07/12/2021 08:40:49 -------------------------------------------------------------------------------- HPI Details Patient Name: Date of Service: [name] [name] [facility] THA N L. 07/12/2021 8:30 A M Medical Record Number: [id number] Patient Account Number: [id number] Date of Birth/Sex: Treating RN: 09-06-1955 (66 y.o. [name] Primary Care Provider: [name], Alyssa Other Clinician: Referring Provider: Treating Provider/Extender: Thana Ates, Alyssa Weeks in Treatment: 34 History of Present Illness HPI Description: ADMISSION 07/07/2020 This is a 66 year old man who is a type II diabetic. I have reviewed this in epic. It appeared that his diabetes was diagnosed in 2009 at which time he was on metformin. He was followed for a period of time by an endocrinologist with [facility] to always labeled him as a type II diabetic. Recently in epic there is been a switch to type 1 diabetes. If there were defining antibody test done I do not see them and I think the totality of the evidence would suggest that he is a type II diabetic. His recent problem started in May when he fell down 17 stairs. He suffered a scapular fracture he was hospitalized at [facility] for 9 days and then discharged to [facility]. His mother who is present said that most of these wounds seem to develop surrounding  the discharge from hospital or the stay at [facility]. He has an area on his lower sacrum which appears currently to be a stage II but per their description this is actually improved quite a bit. He has an area on the left lateral foot at roughly the base  of the fifth metatarsal, the left Achilles heel and the right fifth metatarsal head laterally they have been using topical antibiotics. Seen by primary care yesterday and placed on Keflex The patient had arterial studies on 05/05/2020. I wondered whether this had something to do with wounds on his feet at that time but I have not been able to determine this. At that time his ABI on the right was 1.09 with a TBI of 0.65 on the left 0.88 with a TBI of 0.44. Waveforms were triphasic on the right monophasic on the left. He likely has significant PAD on the left Past medical history includes type 2 diabetes poorly controlled last hemoglobin A1c at 12.7 in May, hypertension, hyperlipidemia, atrial fib on Eliquis, coronary artery disease status post CABG, left scapular fracture in May of this year, AAA repair. Bilateral leg weakness ABIs were not done in our clinic as they were just done in May of this year at [facility] 7/14; patient arrives back in clinic he has areas on the left Achilles heel, base of the left fifth metatarsal, right lateral fifth metatarsal. The area he had over the coccyx appears to be healed but this area remains vulnerable. We have been using Iodoflex. I rereviewed his ABIs which were done in May. I do not know exactly what prompted these test however they do show a TBI of 0.44 on the left and monophasic waveforms. 7/21; the patient's wounds are about the same. Certainly no healing and the nurses noted some green drainage on the right fifth metatarsal head, left Achilles a little larger. We have been using silver alginate really without any improvement. We did get an appointment with vascular surgery however its that it did  the end of August and they seem to want to repeat the noninvasive test that they have already done in May. I am not sure what the issue here is. We advised the patient's sister to call and point to the idea that they have already had noninvasive studies. The real question I have is does this patient need an angiogram. He is a diabetic and it is possible that the ABIs are falsely elevated because of medial calcification. At least on the left his TBI was abnormal 08/08/2020 upon evaluation today patient appears to be doing about the same in regard to his wounds. He still waiting the appointment with vascular. This is something that hopefully should be undertaken shortly. The 29th of this month is when he is actually scheduled although they have put in a call to move this up if at all possible. Fortunately there does not appear to be any signs of active infection at this time. No fevers, chills, nausea, vomiting, or diarrhea. 8/18; appointment with Dr. Trula Slade on 8/29. The patient has wounds on the left Achilles heel, left lateral foot and the right lateral fifth metatarsal head. We have been using Santyl under Hydrofera Blue. The surface of especially the left heel looks better to me than what I remember. 9/1; since the patient was last here he underwent angiography by Dr. Trula Slade he also underwent noninvasive studies first showing a ABI of of 0.64 with monophasic waveforms. His TBI was 0.35 with a left great toe pressure of 46 this is about the same pattern as we saw previously. The left was a lot worse than the right. He underwent an angiogram on 08/29/2020. He had multiple 80% peroneal artery stenosis which was his dominant vessel which was treated with angioplasty with no residual stenosis.  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 The anterior tibial artery was also occluded. An attempt was made at recanalization of the anterior tibial artery however this was unsuccessful. Noted that the peroneal  artery reconstitutes the posterior tibial artery and the anterior tibial artery creating the plantar arch. The patient has areas on the Achilles left heel left lateral foot and right lateral fifth metatarsal head we have been using Santyl and Hydrofera Blue 9/22; since the patient was last here he went for repeat vascular studies. On the right his ABI was 0.94 with biphasic waveforms TBI of 0.73 with a great toe pressure of 75. On the left which is the problematic wound his ABI was 1.08 but with biphasic waveforms at the PTA. Biphasic waveforms at the dorsalis pedis his TBI was 0.63 with a great toe pressure of 65. On the left however this does represent a fairly marked improvement. We have been using Santyl and Hydrofera Blue 9/29 left Achilles area. Still is having necrotic surface. Bleeds very easily as the patient is on a combination of Eliquis aspirin and Plavix just stopped yesterday. I am not going to do any debridement until next week. We are using Santyl and Hydrofera Blue. His arterial status on the right was actually quite good. The area on the fifth metatarsal head needs additional debridement. A total contact cast in this area is not out of the question. He does describe claudication I believe with minimal activity. He is not very active X-ray we ordered last week is still pending in terms of the report 10/6 left Achilles area. In terms of the necrotic surface this is quite a bit better. There is still some debris in the posterior part of the wound however this looks better. Anteriorly the wound appears to be epithelialized there is still a divot in the middle of this but this does not probe to deep tissue. The area on the right fifth metatarsal head laterally really is not making as much progress as I would have liked. Still not completely a viable surface. Both the patient and his wife agree that he is not an external rotator in terms of ambulation [an outie]. We have been using Santyl  and Hydrofera Blue for a prolonged period. On the left Achilles which looked like it is worse wound this seems to be making some progress not so much on the right foot. The patient and his wife state that they are rigorous about offloading both of these areas He was treated with angioplasty as I believe on the right peroneal artery that had multiple stenosis. This is a dominant vessel the anterior tibial and posterior tibial arteries are occluded however the foot is fed via collaterals from the peroneal. From my notes I am not able to understand the vascular supply on the left I will have to review this. 10/13; left Achilles top part of this looks healthy granulation in the bottom part of this wound again completely necrotic requiring debridement. The area on the right lateral fifth met head also requires debridement. The majority of this is epithelialized however he she there is a refractory part to this. 10/20; left Achilles 100% better looking wound surface. Necrotic upper 50% seems to have resolved at least by inspection under illumination. Culture I did of this area last week postdebridement showed Proteus and Citrobacter. Both sensitive to quinolones and I put him on ciprofloxacin. We have been using silver alginate on the wound on the heel and Santyl and Hydrofera Blue on  the right fifth met head. 10/27; left Achilles is measuring much smaller we are using topical gentamicin under silver alginate. Right fifth met head still fibrinous debris on the surface we are using Santyl and Hydrofera Blue. Patient lost his brother suddenly last week he was unable to keep his vascular surgery appointment. 11/3; left Achilles not as viable surface as I might like. Some hypergranulation. Right fifth metatarsal head still completely nonviable surface. It turns out that she was not using topical gentamicin on the left heel [mother] they have been using Santyl and Hydrofera Blue on the right fifth metatarsal  head. The patient did have noninvasive vascular studies on 10/ 28/22. He had triphasic waveforms up to the level of the proximal PTA and then a 50 to 74% stenosis in the mid PTA. Peroneal artery was triphasic as was the anterior tibial. He follows up with Dr. Trula Slade on 11/7. 11/17; since the patient was last here he saw Dr. Trula Slade and went for ANGIOGRAPHY on 11/15/2020. He has wounds on the left Achilles heel and the right lateral fifth metatarsal head. He has known PAD and had previous revascularization in August. On this occasion on the right his right common femoral, profundofemoral and superficial femoral and popliteal arteries were widely patent the dominant vessel is the posterior tibial which had several areas of stenosis greater than 80% he went underwent angioplasty in this area. He had no interventions on the left however he is left common femoral, profundofemoral and superficial femoral arteries were widely patent as well as the popliteal he has recurrent stenosis greater than 50% within the peroneal artery which is a single- vessel runoff. The thought was that he may require further angiography and reangioplasty on the left peroneal artery. Overall the patient has severe PAD he has undergone angioplasty of the right posterior tibial artery. He arrives in clinic today with the Achilles heel area on the left surprisingly better with marked improvement in the surface area on the right not nearly as good but stable 12/8; patient has follow-up arterial Doppler studies on 12/[facility] at the [facility]. He is now being revascularized on both sides. Fortunately on the right Achilles area this is just about closed slight scabbing/eschar. I did not remove this today. The area on the right fifth met head looked better after last week's debridement we have been using Santyl and Hydrofera Blue 01/05/21 the patient has not been here in almost a month. He was supposed to have follow-up Doppler studies on  12/19 look these up. He has been revascularized on both sides The left Achilles area is closed he does not need to dress this but will need to protect this area on an ongoing basis likely indefinitely. He still has the area on the right fifth plantar metatarsal head. The area is measuring smaller he has a rim of epithelialization. The granulation tissue still a bit gelatinous for my liking 1/26; The left Achilles remains closed. Plantar right fifth metatarsal head is smaller and appears to be healthy. He has not followed up with vascular surgery and does not seem particularly interested. He did not get his follow-up arterial Dopplers. I explained to him that angioplasty of the small arteries in his leg is a limb saving procedure but possibility of restenosis is reasonably high and he should follow-up with follow-up noninvasive studies. Frankly he did not seem interested today 03/30/2021: The patient has not been seen here since the end of January. He never followed up with vascular surgery and  today states that he refuses to be seen at [facility] due to his dissatisfaction with the fact that his arteries seem to restenose. The wound on his left Achilles is still closed. The plantar right fifth metatarsal head is quite a bit bigger than it was at his last visit. He is accompanied by his mother who reports that it had gotten down to a very small size but then she went out of town for a week and during that time it enlarged to its current dimensions. The patient is wearing the functional equivalent of house slippers for shoes and is not making any effort to offload. He refuses to wear a forefoot offloading sandal due to instability on his feet and says that when a total contact cast was attempted, it was "a total disaster" and he is unwilling to consider this again. He has been in PolyMem silver since at least the beginning of January. 04/26/2021: The patient is here today after about a month. He has been using  Hydrofera Blue to his wound and today it is markedly improved. It is small and clean. He is scheduled to see Dr. Gwenlyn Found in interventional cardiology tomorrow. 05/24/2021: Once again, the patient returns after about a month. I had referred him to Dr. Gwenlyn Found and the patient walked out from that appointment. Apparently there was some confusion over why he was seeing the cardiologist for his peripheral arterial disease. He saw his primary cardiologist, Dr. Davina Poke, last week and has been re-referred to see Dr. Gwenlyn Found, the appointment is tomorrow. Basically the wound is a little bit bigger today but clean without any slough accumulation. The periwound callus that has accumulated is completely macerated. I thought he had been in [facility], but the dressing that had been removed and discarded today looks more like PolyMem Ag. 05/31/2021: The patient decided not to see Dr. Gwenlyn Found because he feels like anytime he has a vascular intervention, the arteries just closed up again and he does not want to do this anymore. He does not really grasp the nature of the disease process. The wound as a bit smaller today and very clean. No accumulation of periwound callus. We have been using Hydrofera Blue. 06/07/2021: The wound looks good today. It is a little bit smaller without any significant periwound callus accumulation. The surface is clean with good granulation tissue. 06/20/2021: The patient's mother was out of town last week and so he could not come to clinic. He did have help with his wound care during her absence. Today, the wound looks very good. It is a little bit smaller. There is some slough accumulation on the surface. 06/27/2021: The wound is about the same size today, but the surface is fairly clean. There is some dead skin around the perimeter, but no real callus accumulation. Thin layer of slough. 07/11/2021: The wound is now flush with the surrounding skin. It has contracted considerably. There is just a  light layer of slough and biofilm on the surface. Electronic Signature(s) Signed: 07/12/2021 9:09:40 AM By: Fredirick Maudlin MD FACS Entered By: Fredirick Maudlin on 07/12/2021 09:09:40 -------------------------------------------------------------------------------- Physical Exam Details Patient Name: Date of Service: [name] [name] [facility] THA N L. 07/12/2021 8:30 A M Medical Record Number: [id number] Patient Account Number: [id number] Date of Birth/Sex: Treating RN: [birth year] (66 y.o. [name] Primary Care Provider: [name], Alyssa Other Clinician: Referring Provider: Treating Provider/Extender: Fredirick Maudlin [name], Alyssa Weeks in Treatment: 44 Constitutional Within normal range for this patient. Slightly bradycardic, asymptomatic.. . . No  acute distress.Marland Kitchen Respiratory Normal work of breathing on room air.. Notes 07/11/2021: The wound is now flush with the surrounding skin. It has contracted considerably. There is just a light layer of slough and biofilm on the surface. Electronic Signature(s) Signed: 07/12/2021 9:10:13 AM By: Fredirick Maudlin MD FACS Entered By: Fredirick Maudlin on 07/12/2021 09:10:13 -------------------------------------------------------------------------------- Physician Orders Details Patient Name: Date of Service: [name] [name] [facility] THA N L. 07/12/2021 8:30 A M Medical Record Number: [id number] Patient Account Number: [id number] Date of Birth/Sex: Treating RN: 08-09-1955 (66 y.o. [name] Primary Care Provider: [name], Alyssa Other Clinician: Referring Provider: Treating Provider/Extender: Orland Jarred in Treatment: 64 Verbal / Phone Orders: No Diagnosis Coding ICD-10 Coding Code Description L97.511 Non-pressure chronic ulcer of other part of right foot limited to breakdown of skin E11.621 Type 2 diabetes mellitus with foot ulcer E11.51 Type 2 diabetes mellitus with diabetic peripheral angiopathy without  gangrene E11.42 Type 2 diabetes mellitus with diabetic polyneuropathy Follow-up Appointments ppointment in 1 week. - Dr. Celine Ahr - Room 2 - 7/19 at 8:30 AM Return A Bathing/ Shower/ Hygiene May shower and wash wound with soap and water. - with dressing changes only. Edema Control - Lymphedema / SCD / Other Elevate legs to the level of the heart or above for 30 minutes daily and/or when sitting, a frequency of: - 3-4 times a day throughout the day. Moisturize legs daily. - every night before bed. *** closely monitor feet every night before bed. Off-Loading Other: - float heels while resting in bed or chair. Use a pillow or rolled sheet under legs to offload the heels. Ensure no pressure to right lateral foot. Home Health No change in wound care orders this week; continue Home Health for wound care. May utilize formulary equivalent dressing for wound treatment orders unless otherwise specified. Other Home Health Orders/Instructions: - [facility] home health weekly changes and all other days family to change. Wound Treatment Wound #3 - Foot Wound Laterality: Right, Lateral Cleanser: Soap and Water Every Other Day/10 Days Discharge Instructions: May shower and wash wound with dial antibacterial soap and water prior to dressing change. Cleanser: Wound Cleanser Every Other Day/10 Days Discharge Instructions: Cleanse the wound with wound cleanser prior to applying a clean dressing using gauze sponges, not tissue or cotton balls. Prim Dressing: Promogran Prisma Matrix, 4.34 (sq in) (silver collagen) (Generic) Every Other Day/10 Days ary Discharge Instructions: Moisten collagen with saline or hydrogel Secondary Dressing: ALLEVYN Gentle Border, 3x3 (in/in) (DME) (Generic) Every Other Day/10 Days Discharge Instructions: Apply over primary dressing as directed. Electronic Signature(s) Signed: 07/12/2021 9:14:37 AM By: Fredirick Maudlin MD FACS Entered By: Fredirick Maudlin on 07/12/2021  09:10:27 -------------------------------------------------------------------------------- Problem List Details Patient Name: Date of Service: [name] [name] [facility] THA N L. 07/12/2021 8:30 A M Medical Record Number: [id number] Patient Account Number: [id number] Date of Birth/Sex: Treating RN: [birth year] (66 y.o. [name] Primary Care Provider: [name], Alyssa Other Clinician: Referring Provider: Treating Provider/Extender: Orland Jarred in Treatment: 48 Active Problems ICD-10 Encounter Code Description Active Date MDM Diagnosis L97.511 Non-pressure chronic ulcer of other part of right foot limited to breakdown of 07/07/2020 No Yes skin E11.621 Type 2 diabetes mellitus with foot ulcer 07/07/2020 No Yes E11.51 Type 2 diabetes mellitus with diabetic peripheral angiopathy without gangrene 09/01/2020 No Yes E11.42 Type 2 diabetes mellitus with diabetic polyneuropathy 07/07/2020 No Yes Inactive Problems ICD-10 Code Description Active Date Inactive Date L97.528 Non-pressure chronic ulcer of other part of left foot  with other specified severity 07/07/2020 07/07/2020 L89.152 Pressure ulcer of sacral region, stage 2 07/07/2020 07/07/2020 J03.009 Pressure ulcer of left heel, stage 2 07/07/2020 07/07/2020 Resolved Problems Electronic Signature(s) Signed: 07/12/2021 9:06:24 AM By: Fredirick Maudlin MD FACS Entered By: Fredirick Maudlin on 07/12/2021 09:06:23 -------------------------------------------------------------------------------- Progress Note Details Patient Name: Date of Service: [name] [name] [facility] THA N L. 07/12/2021 8:30 A M Medical Record Number: [id number] Patient Account Number: [id number] Date of Birth/Sex: Treating RN: 1955/05/31 (66 y.o. [name] Primary Care Provider: [name], Alyssa Other Clinician: Referring Provider: Treating Provider/Extender: Orland Jarred in Treatment: 90 Subjective Chief Complaint Information  obtained from Patient 07/07/2020; patient is in clinic for today for review of pressure ulcers on his coccyx and left heel and diabetic foot ulcers bilaterally. History of Present Illness (HPI) ADMISSION 07/07/2020 This is a 66 year old man who is a type II diabetic. I have reviewed this in epic. It appeared that his diabetes was diagnosed in 2009 at which time he was on metformin. He was followed for a period of time by an endocrinologist with [facility] to always labeled him as a type II diabetic. Recently in epic there is been a switch to type 1 diabetes. If there were defining antibody test done I do not see them and I think the totality of the evidence would suggest that he is a type II diabetic. His recent problem started in May when he fell down 17 stairs. He suffered a scapular fracture he was hospitalized at [facility] for 9 days and then discharged to [facility]. His mother who is present said that most of these wounds seem to develop surrounding the discharge from hospital or the stay at [facility]. He has an area on his lower sacrum which appears currently to be a stage II but per their description this is actually improved quite a bit. He has an area on the left lateral foot at roughly the base of the fifth metatarsal, the left Achilles heel and the right fifth metatarsal head laterally they have been using topical antibiotics. Seen by primary care yesterday and placed on Keflex The patient had arterial studies on 05/05/2020. I wondered whether this had something to do with wounds on his feet at that time but I have not been able to determine this. At that time his ABI on the right was 1.09 with a TBI of 0.65 on the left 0.88 with a TBI of 0.44. Waveforms were triphasic on the right monophasic on the left. He likely has significant PAD on the left Past medical history includes type 2 diabetes poorly controlled last hemoglobin A1c at 12.7 in May, hypertension, hyperlipidemia, atrial fib on Eliquis,  coronary artery disease status post CABG, left scapular fracture in May of this year, AAA repair. Bilateral leg weakness ABIs were not done in our clinic as they were just done in May of this year at [facility] 7/14; patient arrives back in clinic he has areas on the left Achilles heel, base of the left fifth metatarsal, right lateral fifth metatarsal. The area he had over the coccyx appears to be healed but this area remains vulnerable. We have been using Iodoflex. I rereviewed his ABIs which were done in May. I do not know exactly what prompted these test however they do show a TBI of 0.44 on the left and monophasic waveforms. 7/21; the patient's wounds are about the same. Certainly no healing and the nurses noted some green drainage on the right fifth  metatarsal head, left Achilles a little larger. We have been using silver alginate really without any improvement. We did get an appointment with vascular surgery however its that it did the end of August and they seem to want to repeat the noninvasive test that they have already done in May. I am not sure what the issue here is. We advised the patient's sister to call and point to the idea that they have already had noninvasive studies. The real question I have is does this patient need an angiogram. He is a diabetic and it is possible that the ABIs are falsely elevated because of medial calcification. At least on the left his TBI was abnormal 08/08/2020 upon evaluation today patient appears to be doing about the same in regard to his wounds. He still waiting the appointment with vascular. This is something that hopefully should be undertaken shortly. The 29th of this month is when he is actually scheduled although they have put in a call to move this up if at all possible. Fortunately there does not appear to be any signs of active infection at this time. No fevers, chills, nausea, vomiting, or diarrhea. 8/18; appointment with Dr. Trula Slade on  8/29. The patient has wounds on the left Achilles heel, left lateral foot and the right lateral fifth metatarsal head. We have been using Santyl under Hydrofera Blue. The surface of especially the left heel looks better to me than what I remember. 9/1; since the patient was last here he underwent angiography by Dr. Trula Slade he also underwent noninvasive studies first showing a ABI of of 0.64 with monophasic waveforms. His TBI was 0.35 with a left great toe pressure of 46 this is about the same pattern as we saw previously. The left was a lot worse than the right. He underwent an angiogram on 08/29/2020. He had multiple 80% peroneal artery stenosis which was his dominant vessel which was treated with angioplasty with no residual stenosis. 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 The anterior tibial artery was also occluded. An attempt was made at recanalization of the anterior tibial artery however this was unsuccessful. Noted that the peroneal artery reconstitutes the posterior tibial artery and the anterior tibial artery creating the plantar arch. The patient has areas on the Achilles left heel left lateral foot and right lateral fifth metatarsal head we have been using Santyl and Hydrofera Blue 9/22; since the patient was last here he went for repeat vascular studies. On the right his ABI was 0.94 with biphasic waveforms TBI of 0.73 with a great toe pressure of 75. On the left which is the problematic wound his ABI was 1.08 but with biphasic waveforms at the PTA. Biphasic waveforms at the dorsalis pedis his TBI was 0.63 with a great toe pressure of 65. On the left however this does represent a fairly marked improvement. We have been using Santyl and Hydrofera Blue 9/29 left Achilles area. Still is having necrotic surface. Bleeds very easily as the patient is on a combination of Eliquis aspirin and Plavix just stopped yesterday. I am not going to do any debridement until next week. We are using  Santyl and Hydrofera Blue. His arterial status on the right was actually quite good. The area on the fifth metatarsal head needs additional debridement. A total contact cast in this area is not out of the question. He does describe claudication I believe with minimal activity. He is not very active X-ray we ordered last week is still pending in terms  of the report 10/6 left Achilles area. In terms of the necrotic surface this is quite a bit better. There is still some debris in the posterior part of the wound however this looks better. Anteriorly the wound appears to be epithelialized there is still a divot in the middle of this but this does not probe to deep tissue. The area on the right fifth metatarsal head laterally really is not making as much progress as I would have liked. Still not completely a viable surface. Both the patient and his wife agree that he is not an external rotator in terms of ambulation [an outie]. We have been using Santyl and Hydrofera Blue for a prolonged period. On the left Achilles which looked like it is worse wound this seems to be making some progress not so much on the right foot. The patient and his wife state that they are rigorous about offloading both of these areas He was treated with angioplasty as I believe on the right peroneal artery that had multiple stenosis. This is a dominant vessel the anterior tibial and posterior tibial arteries are occluded however the foot is fed via collaterals from the peroneal. From my notes I am not able to understand the vascular supply on the left I will have to review this. 10/13; left Achilles top part of this looks healthy granulation in the bottom part of this wound again completely necrotic requiring debridement. oo The area on the right lateral fifth met head also requires debridement. The majority of this is epithelialized however he she there is a refractory part to this. 10/20; left Achilles 100% better looking wound  surface. Necrotic upper 50% seems to have resolved at least by inspection under illumination. Culture I did of this area last week postdebridement showed Proteus and Citrobacter. Both sensitive to quinolones and I put him on ciprofloxacin. We have been using silver alginate on the wound on the heel and Santyl and Hydrofera Blue on the right fifth met head. 10/27; left Achilles is measuring much smaller we are using topical gentamicin under silver alginate. Right fifth met head still fibrinous debris on the surface we are using Santyl and Hydrofera Blue. Patient lost his brother suddenly last week he was unable to keep his vascular surgery appointment. 11/3; left Achilles not as viable surface as I might like. Some hypergranulation. Right fifth metatarsal head still completely nonviable surface. It turns out that she was not using topical gentamicin on the left heel [mother] they have been using Santyl and Hydrofera Blue on the right fifth metatarsal head. The patient did have noninvasive vascular studies on 10/ 28/22. He had triphasic waveforms up to the level of the proximal PTA and then a 50 to 74% stenosis in the mid PTA. Peroneal artery was triphasic as was the anterior tibial. He follows up with Dr. Trula Slade on 11/7. 11/17; since the patient was last here he saw Dr. Trula Slade and went for ANGIOGRAPHY on 11/15/2020. He has wounds on the left Achilles heel and the right lateral fifth metatarsal head. He has known PAD and had previous revascularization in August. On this occasion on the right his right common femoral, profundofemoral and superficial femoral and popliteal arteries were widely patent the dominant vessel is the posterior tibial which had several areas of stenosis greater than 80% he went underwent angioplasty in this area. He had no interventions on the left however he is left common femoral, profundofemoral and superficial femoral arteries were widely patent as well as the popliteal  he  has recurrent stenosis greater than 50% within the peroneal artery which is a single- vessel runoff. The thought was that he may require further angiography and reangioplasty on the left peroneal artery. Overall the patient has severe PAD he has undergone angioplasty of the right posterior tibial artery. He arrives in clinic today with the Achilles heel area on the left surprisingly better with marked improvement in the surface area on the right not nearly as good but stable 12/8; patient has follow-up arterial Doppler studies on 12/19 at the [facility]. He is now being revascularized on both sides. Fortunately on the right Achilles area this is just about closed slight scabbing/eschar. I did not remove this today. The area on the right fifth met head looked better after last week's debridement we have been using Santyl and Hydrofera Blue 01/05/21 the patient has not been here in almost a month. He was supposed to have follow-up Doppler studies on 12/19 look these up. He has been revascularized on both sides The left Achilles area is closed he does not need to dress this but will need to protect this area on an ongoing basis likely indefinitely. He still has the area on the right fifth plantar metatarsal head. The area is measuring smaller he has a rim of epithelialization. The granulation tissue still a bit gelatinous for my liking 1/26; The left Achilles remains closed. Plantar right fifth metatarsal head is smaller and appears to be healthy. He has not followed up with vascular surgery and does not seem particularly interested. He did not get his follow-up arterial Dopplers. I explained to him that angioplasty of the small arteries in his leg is a limb saving procedure but possibility of restenosis is reasonably high and he should follow-up with follow-up noninvasive studies. Frankly he did not seem interested today 03/30/2021: The patient has not been seen here since the end of January. He  never followed up with vascular surgery and today states that he refuses to be seen at [facility] due to his dissatisfaction with the fact that his arteries seem to restenose. The wound on his left Achilles is still closed. The plantar right fifth metatarsal head is quite a bit bigger than it was at his last visit. He is accompanied by his mother who reports that it had gotten down to a very small size but then she went out of town for a week and during that time it enlarged to its current dimensions. The patient is wearing the functional equivalent of house slippers for shoes and is not making any effort to offload. He refuses to wear a forefoot offloading sandal due to instability on his feet and says that when a total contact cast was attempted, it was "a total disaster" and he is unwilling to consider this again. He has been in PolyMem silver since at least the beginning of January. 04/26/2021: The patient is here today after about a month. He has been using Hydrofera Blue to his wound and today it is markedly improved. It is small and clean. He is scheduled to see Dr. Gwenlyn Found in interventional cardiology tomorrow. 05/24/2021: Once again, the patient returns after about a month. I had referred him to Dr. Gwenlyn Found and the patient walked out from that appointment. Apparently there was some confusion over why he was seeing the cardiologist for his peripheral arterial disease. He saw his primary cardiologist, Dr. Davina Poke, last week and has been re-referred to see Dr. Gwenlyn Found, the appointment is tomorrow. Basically the  wound is a little bit bigger today but clean without any slough accumulation. The periwound callus that has accumulated is completely macerated. I thought he had been in [facility], but the dressing that had been removed and discarded today looks more like PolyMem Ag. 05/31/2021: The patient decided not to see Dr. Gwenlyn Found because he feels like anytime he has a vascular intervention, the arteries just  closed up again and he does not want to do this anymore. He does not really grasp the nature of the disease process. The wound as a bit smaller today and very clean. No accumulation of periwound callus. We have been using Hydrofera Blue. 06/07/2021: The wound looks good today. It is a little bit smaller without any significant periwound callus accumulation. The surface is clean with good granulation tissue. 06/20/2021: The patient's mother was out of town last week and so he could not come to clinic. He did have help with his wound care during her absence. Today, the wound looks very good. It is a little bit smaller. There is some slough accumulation on the surface. 06/27/2021: The wound is about the same size today, but the surface is fairly clean. There is some dead skin around the perimeter, but no real callus accumulation. Thin layer of slough. 07/11/2021: The wound is now flush with the surrounding skin. It has contracted considerably. There is just a light layer of slough and biofilm on the surface. Patient History Information obtained from Patient. Family History Cancer - Paternal Grandparents,Father, Heart Disease - Father, Hypertension - Father, No family history of Diabetes, Hereditary Spherocytosis, Kidney Disease, Lung Disease, Seizures, Stroke, Thyroid Problems, Tuberculosis. Social History Current some day smoker - Landscape architect, Marital Status - Divorced, Alcohol Use - Rarely, Drug Use - No History, Caffeine Use - Daily. Medical History Cardiovascular Patient has history of Coronary Artery Disease, Hypertension Endocrine Patient has history of Type II Diabetes Musculoskeletal Patient has history of Osteoarthritis Neurologic Patient has history of Neuropathy Objective Constitutional Within normal range for this patient. Slightly bradycardic, asymptomatic.Marland Kitchen No acute distress.. Vitals Time Taken: 8:32 AM, Height: 74 in, Weight: 151 lbs, BMI: 19.4, Temperature: 98.4 F, Pulse: 58 bpm,  Respiratory Rate: 16 breaths/min, Blood Pressure: 92/53 mmHg. Respiratory Normal work of breathing on room air.. General Notes: 07/11/2021: The wound is now flush with the surrounding skin. It has contracted considerably. There is just a light layer of slough and biofilm on the surface. Integumentary (Hair, Skin) Wound #3 status is Open. Original cause of wound was Gradually Appeared. The date acquired was: 06/21/2020. The wound has been in treatment 52 weeks. The wound is located on the Right,Lateral Foot. The wound measures 0.5cm length x 0.4cm width x 0.1cm depth; 0.157cm^2 area and 0.016cm^3 volume. There is Fat Layer (Subcutaneous Tissue) exposed. There is no tunneling or undermining noted. There is a medium amount of serosanguineous drainage noted. The wound margin is flat and intact. There is large (67-100%) red, pink granulation within the wound bed. There is a small (1-33%) amount of necrotic tissue within the wound bed including Adherent Slough. Assessment Active Problems ICD-10 Non-pressure chronic ulcer of other part of right foot limited to breakdown of skin Type 2 diabetes mellitus with foot ulcer Type 2 diabetes mellitus with diabetic peripheral angiopathy without gangrene Type 2 diabetes mellitus with diabetic polyneuropathy Procedures Wound #3 Pre-procedure diagnosis of Wound #3 is a Diabetic Wound/Ulcer of the Lower Extremity located on the Right,Lateral Foot .Severity of Tissue Pre Debridement is: Fat layer exposed. There was a  Excisional Skin/Subcutaneous Tissue Debridement with a total area of 0.2 sq cm performed by Fredirick Maudlin, MD. With the following instrument(s): Curette to remove Non-Viable tissue/material. Material removed includes Subcutaneous Tissue and Slough and after achieving pain control using Lidocaine 4% T opical Solution. No specimens were taken. A time out was conducted at 08:39, prior to the start of the procedure. A Minimum amount of bleeding was  controlled with Pressure. The procedure was tolerated well with a pain level of 0 throughout and a pain level of 0 following the procedure. Post Debridement Measurements: 0.5cm length x 0.4cm width x 0.1cm depth; 0.016cm^3 volume. Character of Wound/Ulcer Post Debridement is improved. Severity of Tissue Post Debridement is: Fat layer exposed. Post procedure Diagnosis Wound #3: Same as Pre-Procedure Plan Follow-up Appointments: Return Appointment in 1 week. - Dr. Celine Ahr - Room 2 - 7/19 at 8:30 AM Bathing/ Shower/ Hygiene: May shower and wash wound with soap and water. - with dressing changes only. Edema Control - Lymphedema / SCD / Other: Elevate legs to the level of the heart or above for 30 minutes daily and/or when sitting, a frequency of: - 3-4 times a day throughout the day. Moisturize legs daily. - every night before bed. *** closely monitor feet every night before bed. Off-Loading: Other: - float heels while resting in bed or chair. Use a pillow or rolled sheet under legs to offload the heels. Ensure no pressure to right lateral foot. Home Health: No change in wound care orders this week; continue Home Health for wound care. May utilize formulary equivalent dressing for wound treatment orders unless otherwise specified. Other Home Health Orders/Instructions: - [facility] home health weekly changes and all other days family to change. WOUND #3: - Foot Wound Laterality: Right, Lateral Cleanser: Soap and Water Every Other Day/10 Days Discharge Instructions: May shower and wash wound with dial antibacterial soap and water prior to dressing change. Cleanser: Wound Cleanser Every Other Day/10 Days Discharge Instructions: Cleanse the wound with wound cleanser prior to applying a clean dressing using gauze sponges, not tissue or cotton balls. Prim Dressing: Promogran Prisma Matrix, 4.34 (sq in) (silver collagen) (Generic) Every Other Day/10 Days ary Discharge Instructions: Moisten collagen with  saline or hydrogel Secondary Dressing: ALLEVYN Gentle Border, 3x3 (in/in) (DME) (Generic) Every Other Day/10 Days Discharge Instructions: Apply over primary dressing as directed. 07/11/2021: The wound is now flush with the surrounding skin. It has contracted considerably. There is just a light layer of slough and biofilm on the surface. I used a curette to debride slough, biofilm, and a small amount of nonviable subcutaneous tissue from the wound. We will continue using Prisma silver collagen. Follow-up in 1 week. Electronic Signature(s) Signed: 07/12/2021 9:10:52 AM By: Fredirick Maudlin MD FACS Entered By: Fredirick Maudlin on 07/12/2021 09:10:52 -------------------------------------------------------------------------------- HxROS Details Patient Name: Date of Service: [name] [name] HNA THA N L. 07/12/2021 8:30 A M Medical Record Number: [id number] Patient Account Number: [id number] Date of Birth/Sex: Treating RN: 03/28/1955 (66 y.o. [name] Primary Care Provider: [name], Alyssa Other Clinician: Referring Provider: Treating Provider/Extender: Orland Jarred in Treatment: 65 Information Obtained From Patient Cardiovascular Medical History: Positive for: Coronary Artery Disease; Hypertension Endocrine Medical History: Positive for: Type II Diabetes Treated with: Insulin Blood sugar tested every day: Yes Tested : CGM Musculoskeletal Medical History: Positive for: Osteoarthritis Neurologic Medical History: Positive for: Neuropathy Immunizations Pneumococcal Vaccine: Received Pneumococcal Vaccination: Yes Received Pneumococcal Vaccination On or After 60th Birthday: Yes Implantable Devices None Family and Social  History Cancer: Yes - Paternal Grandparents,Father; Diabetes: No; Heart Disease: Yes - Father; Hereditary Spherocytosis: No; Hypertension: Yes - Father; Kidney Disease: No; Lung Disease: No; Seizures: No; Stroke: No; Thyroid Problems:  No; Tuberculosis: No; Current some day smoker - Cigars; Marital Status - Divorced; Alcohol Use: Rarely; Drug Use: No History; Caffeine Use: Daily; Financial Concerns: No; Food, Clothing or Shelter Needs: No; Support System Lacking: No; Transportation Concerns: No Electronic Signature(s) Signed: 07/12/2021 9:14:37 AM By: Fredirick Maudlin MD FACS Signed: 07/12/2021 5:44:21 PM By: Adline Peals Entered By: Fredirick Maudlin on 07/12/2021 09:09:46 -------------------------------------------------------------------------------- SuperBill Details Patient Name: Date of Service: [name] [name] HNA THA N L. 07/12/2021 Medical Record Number: [id number] Patient Account Number: [id number] Date of Birth/Sex: Treating RN: 01-07-1955 (66 y.o. [name] Primary Care Provider: [name], Alyssa Other Clinician: Referring Provider: Treating Provider/Extender: Thana Ates, Alyssa Weeks in Treatment: 52 Diagnosis Coding ICD-10 Codes Code Description L97.511 Non-pressure chronic ulcer of other part of right foot limited to breakdown of skin E11.621 Type 2 diabetes mellitus with foot ulcer E11.51 Type 2 diabetes mellitus with diabetic peripheral angiopathy without gangrene E11.42 Type 2 diabetes mellitus with diabetic polyneuropathy Facility Procedures CPT4 Code: [id number] Description: 61915 - DEB SUBQ TISSUE 20 SQ CM/< ICD-10 Diagnosis Description L97.511 Non-pressure chronic ulcer of other part of right foot limited to breakdown of Modifier: skin Quantity: 1 Physician Procedures : CPT4 Code Description Modifier [id number] - WC PHYS LEVEL 3 - EST PT 25 ICD-10 Diagnosis Description L97.511 Non-pressure chronic ulcer of other part of right foot limited to breakdown of skin E11.621 Type 2 diabetes mellitus with foot ulcer E11.51  Type 2 diabetes mellitus with diabetic peripheral angiopathy without gangrene E11.42 Type 2 diabetes mellitus with diabetic polyneuropathy Quantity:  1 Electronic Signature(s) Signed: 07/12/2021 9:11:09 AM By: Fredirick Maudlin MD FACS Entered By: Fredirick Maudlin on 07/12/2021 09:11:08

## 2021-07-12 NOTE — Telephone Encounter (Signed)
Rx sent to pharmacy   

## 2021-07-12 NOTE — Progress Notes (Signed)
This patient returns to my office for at risk foot care.  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  This patient is unable to cut nails himself since the patient cannot reach his nails.These nails are painful walking and wearing shoes. Patient has ulcer treated right foot by wound center. He presents to the office with his mother.This patient presents for at risk foot care today.  General Appearance  Alert, conversant and in no acute stress.  Vascular  Dorsalis pedis and posterior tibial  pulses are palpable  bilaterally.  Capillary return is within normal limits  bilaterally. Temperature is within normal limits  bilaterally.  Neurologic  Senn-Weinstein monofilament wire test within normal limits  bilaterally. Muscle power within normal limits bilaterally.  Nails Thick disfigured discolored nails with subungual debris  from hallux to fifth toes bilaterally. No evidence of bacterial infection or drainage bilaterally.  Orthopedic  No limitations of motion  feet .  No crepitus or effusions noted.  No bony pathology or digital deformities noted.  Skin  normotropic skin with no porokeratosis noted bilaterally.  No signs of infections or ulcers noted.     Onychomycosis  Pain in right toes  Pain in left toes  Consent was obtained for treatment procedures.   Mechanical debridement of nails 1-5  bilaterally performed with a nail nipper.  Filed with dremel without incident.    Return office visit   3  months                   Told patient to return for periodic foot care and evaluation due to potential at risk complications.   Gardiner Barefoot DPM

## 2021-07-12 NOTE — Telephone Encounter (Signed)
Last filled 08/31/20  Filled by Historical Provider

## 2021-07-13 DIAGNOSIS — L97511 Non-pressure chronic ulcer of other part of right foot limited to breakdown of skin: Secondary | ICD-10-CM | POA: Diagnosis not present

## 2021-07-14 NOTE — Progress Notes (Signed)
Surgical Instructions    Your procedure is scheduled on Thursday, July 20th, 2023.   Report to [facility] Main Entrance "A" at 09:30 A.M., then check in with the Admitting office.  Call this number if you have problems the morning of surgery:  [phone]   If you have any questions prior to your surgery date call [phone]: Open Monday-Friday 8am-4pm    Remember:  Do not eat after midnight the night before your surgery  You may drink clear liquids until 08:30 the morning of your surgery.   Clear liquids allowed are: Water, Non-Citrus Juices (without pulp), Carbonated Beverages, Clear Tea, Black Coffee ONLY (NO MILK, CREAM OR POWDERED CREAMER of any kind), and Gatorade    Take these medicines the morning of surgery with A SIP OF WATER:   atorvastatin (LIPITOR) PARoxetine (PAXIL)  If needed:  loperamide (IMODIUM A-D)  Follow your surgeon's instructions on when to stop Aspirin and Eliquis.  If no instructions were given by your surgeon then you will need to call the office to get those instructions.     As of today, STOP taking any Aspirin (unless otherwise instructed by your surgeon) Aleve, Naproxen, Ibuprofen, Motrin, Advil, Goody's, BC's, all herbal medications, fish oil, and all vitamins.   WHAT DO I DO ABOUT MY DIABETES MEDICATION:   For patients with Insulin Pumps: Contact your diabetes doctor for specific instructions before surgery. Decrease basal insulin rates by 20% at midnight the night before surgery. Do not remove your insulin pump prior to arrival to short stay the morning of surgery.  Anesthesia will instruct you on when to remove your pump. Note that if your surgery is planned to be longer than 2 hours, your insulin pump will be removed and intravenous (IV) insulin will be started and managed by the nurses and anesthesiologist. You will be able to restart your insulin pump once you are awake and able to manage it. Make sure to bring insulin pump supplies to  the hospital with you in case your site needs to be changed.     HOW TO MANAGE YOUR DIABETES BEFORE AND AFTER SURGERY  Why is it important to control my blood sugar before and after surgery? Improving blood sugar levels before and after surgery helps healing and can limit problems. A way of improving blood sugar control is eating a healthy diet by:  Eating less sugar and carbohydrates  Increasing activity/exercise  Talking with your doctor about reaching your blood sugar goals High blood sugars (greater than 180 mg/dL) can raise your risk of infections and slow your recovery, so you will need to focus on controlling your diabetes during the weeks before surgery. Make sure that the doctor who takes care of your diabetes knows about your planned surgery including the date and location.  How do I manage my blood sugar before surgery? Check your blood sugar at least 4 times a day, starting 2 days before surgery, to make sure that the level is not too high or low.  Check your blood sugar the morning of your surgery when you wake up and every 2 hours until you get to the Short Stay unit.  If your blood sugar is less than 70 mg/dL, you will need to treat for low blood sugar: Do not take insulin. Treat a low blood sugar (less than 70 mg/dL) with  cup of clear juice (cranberry or apple), 4 glucose tablets, OR glucose gel. Recheck blood sugar in 15 minutes after treatment (to make sure it  is greater than 70 mg/dL). If your blood sugar is not greater than 70 mg/dL on recheck, call [phone] for further instructions. Report your blood sugar to the short stay nurse when you get to Short Stay.  If you are admitted to the hospital after surgery: Your blood sugar will be checked by the staff and you will probably be given insulin after surgery (instead of oral diabetes medicines) to make sure you have good blood sugar levels. The goal for blood sugar control after surgery is 80-180 mg/dL.     The  day of surgery:        Do not wear jewelry  Do not wear lotions, powders, colognes, or deodorant. Men may shave face and neck. Do not bring valuables to the hospital.  [facility] is not responsible for any belongings or valuables. .   Do NOT Smoke (Tobacco/Vaping)  24 hours prior to your procedure  If you use a CPAP at night, you may bring your mask for your overnight stay.   Contacts, glasses, hearing aids, dentures or partials may not be worn into surgery, please bring cases for these belongings   For patients admitted to the hospital, discharge time will be determined by your treatment team.   Patients discharged the day of surgery will not be allowed to drive home, and someone needs to stay with them for 24 hours.   SURGICAL WAITING ROOM VISITATION Patients having surgery or a procedure may have no more than 2 support people in the waiting area - these visitors may rotate.   Children under the age of 14 must have an adult with them who is not the patient. If the patient needs to stay at the hospital during part of their recovery, the visitor guidelines for inpatient rooms apply. Pre-op nurse will coordinate an appropriate time for 1 support person to accompany patient in pre-op.  This support person may not rotate.   Please refer to the [facility] website for the visitor guidelines for Inpatients (after your surgery is over and you are in a regular room).    Special instructions:    Oral Hygiene is also important to reduce your risk of infection.  Remember - BRUSH YOUR TEETH THE MORNING OF SURGERY WITH YOUR REGULAR TOOTHPASTE   [facility]- Preparing For Surgery  Before surgery, you can play an important role. Because skin is not sterile, your skin needs to be as free of germs as possible. You can reduce the number of germs on your skin by washing with CHG (chlorahexidine gluconate) Soap before surgery.  CHG is an antiseptic cleaner which kills germs and bonds with the skin  to continue killing germs even after washing.     Please do not use if you have an allergy to CHG or antibacterial soaps. If your skin becomes reddened/irritated stop using the CHG.  Do not shave (including legs and underarms) for at least 48 hours prior to first CHG shower. It is OK to shave your face.  Please follow these instructions carefully.     Shower the NIGHT BEFORE SURGERY and the MORNING OF SURGERY with CHG Soap.   If you chose to wash your hair, wash your hair first as usual with your normal shampoo. After you shampoo, rinse your hair and body thoroughly to remove the shampoo.  Then [employer] and genitals (private parts) with your normal soap and rinse thoroughly to remove soap.  After that Use CHG Soap as you would any other liquid soap. You  can apply CHG directly to the skin and wash gently with a scrungie or a clean washcloth.   Apply the CHG Soap to your body ONLY FROM THE NECK DOWN.  Do not use on open wounds or open sores. Avoid contact with your eyes, ears, mouth and genitals (private parts). Wash Face and genitals (private parts)  with your normal soap.   Wash thoroughly, paying special attention to the area where your surgery will be performed.  Thoroughly rinse your body with warm water from the neck down.  DO NOT shower/wash with your normal soap after using and rinsing off the CHG Soap.  Pat yourself dry with a CLEAN TOWEL.  Wear CLEAN PAJAMAS to bed the night before surgery  Place CLEAN SHEETS on your bed the night before your surgery  DO NOT SLEEP WITH PETS.   Day of Surgery:  Take a shower with CHG soap. Wear Clean/Comfortable clothing the morning of surgery Do not apply any deodorants/lotions.   Remember to brush your teeth WITH YOUR REGULAR TOOTHPASTE.    If you received a COVID test during your pre-op visit, it is requested that you wear a mask when out in public, stay away from anyone that may not be feeling well, and notify your surgeon if you  develop symptoms. If you have been in contact with anyone that has tested positive in the last 10 days, please notify your surgeon.    Please read over the following fact sheets that you were given.

## 2021-07-17 ENCOUNTER — Other Ambulatory Visit: Payer: Self-pay

## 2021-07-17 ENCOUNTER — Emergency Department (HOSPITAL_BASED_OUTPATIENT_CLINIC_OR_DEPARTMENT_OTHER): Payer: HMO

## 2021-07-17 ENCOUNTER — Emergency Department (HOSPITAL_BASED_OUTPATIENT_CLINIC_OR_DEPARTMENT_OTHER)
Admission: EM | Admit: 2021-07-17 | Discharge: 2021-07-18 | Disposition: A | Discharge status: Home / Self Care | Payer: HMO | Attending: Emergency Medicine | Admitting: Emergency Medicine

## 2021-07-17 ENCOUNTER — Encounter (HOSPITAL_BASED_OUTPATIENT_CLINIC_OR_DEPARTMENT_OTHER): Payer: Self-pay

## 2021-07-17 ENCOUNTER — Inpatient Hospital Stay (HOSPITAL_COMMUNITY)
Admission: RE | Admit: 2021-07-17 | Discharge: 2021-07-17 | Disposition: A | Discharge status: Home / Self Care | Payer: HMO | Source: Ambulatory Visit

## 2021-07-17 DIAGNOSIS — Z951 Presence of aortocoronary bypass graft: Secondary | ICD-10-CM | POA: Diagnosis not present

## 2021-07-17 DIAGNOSIS — R531 Weakness: Secondary | ICD-10-CM | POA: Diagnosis not present

## 2021-07-17 DIAGNOSIS — R778 Other specified abnormalities of plasma proteins: Secondary | ICD-10-CM | POA: Insufficient documentation

## 2021-07-17 DIAGNOSIS — Z7982 Long term (current) use of aspirin: Secondary | ICD-10-CM | POA: Insufficient documentation

## 2021-07-17 DIAGNOSIS — I4891 Unspecified atrial fibrillation: Secondary | ICD-10-CM | POA: Diagnosis not present

## 2021-07-17 DIAGNOSIS — Z794 Long term (current) use of insulin: Secondary | ICD-10-CM | POA: Insufficient documentation

## 2021-07-17 DIAGNOSIS — U071 COVID-19: Secondary | ICD-10-CM

## 2021-07-17 DIAGNOSIS — I509 Heart failure, unspecified: Secondary | ICD-10-CM | POA: Insufficient documentation

## 2021-07-17 DIAGNOSIS — R509 Fever, unspecified: Secondary | ICD-10-CM | POA: Diagnosis not present

## 2021-07-17 DIAGNOSIS — I2510 Atherosclerotic heart disease of native coronary artery without angina pectoris: Secondary | ICD-10-CM | POA: Diagnosis not present

## 2021-07-17 DIAGNOSIS — Z7901 Long term (current) use of anticoagulants: Secondary | ICD-10-CM | POA: Insufficient documentation

## 2021-07-17 DIAGNOSIS — E109 Type 1 diabetes mellitus without complications: Secondary | ICD-10-CM | POA: Diagnosis not present

## 2021-07-17 DIAGNOSIS — J90 Pleural effusion, not elsewhere classified: Secondary | ICD-10-CM | POA: Diagnosis not present

## 2021-07-17 LAB — COMPREHENSIVE METABOLIC PANEL
ALT: 16 U/L (ref 0–44)
AST: 45 U/L — ABNORMAL HIGH (ref 15–41)
Albumin: 4.3 g/dL (ref 3.5–5.0)
Alkaline Phosphatase: 48 U/L (ref 38–126)
Anion gap: 14 (ref 5–15)
BUN: 23 mg/dL (ref 8–23)
CO2: 26 mmol/L (ref 22–32)
Calcium: 9.6 mg/dL (ref 8.9–10.3)
Chloride: 92 mmol/L — ABNORMAL LOW (ref 98–111)
Creatinine, Ser: 1.16 mg/dL (ref 0.61–1.24)
GFR, Estimated: >60 mL/min (ref >60)
Glucose, Bld: 142 mg/dL — ABNORMAL HIGH (ref 70–99)
Potassium: 3.6 mmol/L (ref 3.5–5.1)
Sodium: 132 mmol/L — ABNORMAL LOW (ref 135–145)
Total Bilirubin: 1 mg/dL (ref 0.3–1.2)
Total Protein: 7.5 g/dL (ref 6.5–8.1)

## 2021-07-17 LAB — CBC WITH DIFFERENTIAL/PLATELET
Abs Immature Granulocytes: 0.02 10*3/uL (ref 0.00–0.07)
Basophils Absolute: 0 10*3/uL (ref 0.0–0.1)
Basophils Relative: 0 %
Eosinophils Absolute: 0 10*3/uL (ref 0.0–0.5)
Eosinophils Relative: 0 %
HCT: 42 % (ref 39.0–52.0)
Hemoglobin: 14.2 g/dL (ref 13.0–17.0)
Immature Granulocytes: 0 %
Lymphocytes Relative: 13 %
Lymphs Abs: 0.9 10*3/uL (ref 0.7–4.0)
MCH: 30.4 pg (ref 26.0–34.0)
MCHC: 33.8 g/dL (ref 30.0–36.0)
MCV: 89.9 fL (ref 80.0–100.0)
Monocytes Absolute: 0.8 10*3/uL (ref 0.1–1.0)
Monocytes Relative: 12 %
Neutro Abs: 5 10*3/uL (ref 1.7–7.7)
Neutrophils Relative %: 75 %
Platelets: 128 10*3/uL — ABNORMAL LOW (ref 150–400)
RBC: 4.67 MIL/uL (ref 4.22–5.81)
RDW: 14.2 % (ref 11.5–15.5)
WBC: 6.6 10*3/uL (ref 4.0–10.5)
nRBC: 0 % (ref 0.0–0.2)

## 2021-07-17 LAB — CBG MONITORING, ED: Glucose-Capillary: 138 mg/dL — ABNORMAL HIGH (ref 70–99)

## 2021-07-17 LAB — TROPONIN I (HIGH SENSITIVITY): Troponin I (High Sensitivity): 58 ng/L — ABNORMAL HIGH (ref <18)

## 2021-07-17 LAB — BRAIN NATRIURETIC PEPTIDE: B Natriuretic Peptide: 146.2 pg/mL — ABNORMAL HIGH (ref 0.0–100.0)

## 2021-07-17 LAB — LACTIC ACID, PLASMA: Lactic Acid, Venous: 2.4 mmol/L (ref 0.5–1.9)

## 2021-07-17 LAB — GROUP A STREP BY PCR: Group A Strep by PCR: NOT DETECTED

## 2021-07-17 MED ORDER — LACTATED RINGERS IV BOLUS: 1000.0000 mL | Freq: Once | INTRAVENOUS | Status: DC

## 2021-07-17 MED ORDER — LACTATED RINGERS IV BOLUS
500.0000 mL | Freq: Once | INTRAVENOUS | Status: AC
  Administered 2021-07-17: 500 mL via INTRAVENOUS

## 2021-07-17 NOTE — ED Triage Notes (Signed)
Patient here POV from Home.  Patient Family endorses patient has been having more recently Fever, General Malaise, Loss of Bowels, Weakness, Nausea, and Emesis. Present since Friday and worsening Since.   Originally Scheduled for Eye Surgery Thursday which was cancelled due to Patient Symptoms.   Fevers Have Been Subjective and No Diarrhea.   NAD Noted during Triage. A&Ox4. GCS 15. BIB Wheelchair.

## 2021-07-17 NOTE — ED Provider Notes (Signed)
[facility] EMERGENCY DEPT Provider Note   CSN: [id number] Arrival date & time: 07/17/21  2124     History {Add pertinent medical, surgical, social history, OB history to HPI:1} Chief Complaint  Patient presents with   Fever    [name] [name] is a 66 y.o. male.   Fever      Home Medications Prior to Admission medications   Medication Sig Start Date End Date Taking? Authorizing Provider  apixaban (ELIQUIS) 5 MG TABS tablet Take 1 tablet (5 mg total) by mouth 2 (two) times daily. PLEASE SCHEDULE APPT WITH ALYSSA FOR FURTHER REFILLS [phone] 07/05/21   [name], [name], PA-C  aspirin EC 81 MG EC tablet Take 1 tablet (81 mg total) by mouth daily. 02/18/21   Domenic Polite, MD  atorvastatin (LIPITOR) 40 MG tablet Take 1 tablet (40 mg total) by mouth daily. 03/29/21   [name], [name], PA-C  B Complex Vitamins (B COMPLEX-B12 PO) Take 1 tablet by mouth daily.     [provider]  BD PEN NEEDLE NANO 2ND GEN 32G X 4 MM MISC USE FOR INSULIN 10/05/20   Janith Lima, MD  Blood Glucose Monitoring Suppl ([place]) w/Device KIT  10/07/20   [provider]  Cholecalciferol (VITAMIN D) 125 MCG (5000 UT) CAPS Take 5,000 Units by mouth daily.     [provider]  Continuous Blood Gluc Sensor (DEXCOM G6 SENSOR) MISC 1 Device by Does not apply route See admin instructions. Change every 10 days 05/10/21   Elayne Snare, MD  Continuous Blood Gluc Transmit (DEXCOM G6 TRANSMITTER) MISC Use as instructed to check blood sugar 04/19/21   Renato Shin, MD  furosemide (LASIX) 40 MG tablet Take 1 tablet (40 mg total) by mouth daily. 04/06/21   [name], [name], PA-C  Incontinence Supply Disposable (DISPOSABLE BRIEF LARGE) MISC 1 each by Does not apply route as needed. 02/24/21   [name], [name], PA-C  insulin aspart (NOVOLOG) 100 UNIT/ML injection For use in pump, total of 40 units per day 04/18/21   Renato Shin, MD  Insulin Disposable  Pump (OMNIPOD 5 G6 INTRO, GEN 5,) KIT 1 Device by Does not apply route every 3 (three) days. 11/09/20   Renato Shin, MD  Insulin Disposable Pump (OMNIPOD 5 G6 POD, GEN 5,) MISC APPLY EVERY 3 (THREE) DAYS. 05/22/21   Elayne Snare, MD  Lancets [facility] DELICA PLUS [id number]) MISC Apply 1 each topically 4 (four) times daily. 10/05/20   [provider]  loperamide (IMODIUM A-D) 2 MG tablet Take 1 tablet (2 mg total) by mouth 4 (four) times daily as needed for diarrhea or loose stools. 02/24/21   [name], [name], PA-C  losartan (COZAAR) 25 MG tablet Take 1 tablet (25 mg total) by mouth daily. 04/06/21   [name], [name], PA-C  mirtazapine (REMERON) 15 MG tablet Take 1 tablet (15 mg total) by mouth at bedtime. 07/12/21   [name], [name], PA-C  ONETOUCH VERIO test strip 1 each 4 (four) times daily. 10/05/20   [provider]  PARoxetine (PAXIL) 20 MG tablet Take 1 tablet (20 mg total) by mouth daily. 07/12/21   [name], [name], PA-C  spironolactone (ALDACTONE) 25 MG tablet TAKE ONE TABLET BY MOUTH DAILY 04/25/21   [name], [name], PA-C      Allergies    Patient has no known allergies.    Review of Systems   Review of Systems  Constitutional:  Positive for fever.  Physical Exam Updated Vital Signs BP (!) 78/60 (BP Location: Right Arm)   Pulse 74   Temp 98.4 F (36.9 C) (Oral)   Resp 20   Ht 6' 2"  (1.88 m)   Wt 81.5 kg   SpO2 97%   BMI 23.07 kg/m  Physical Exam  ED Results / Procedures / Treatments   Labs (all labs ordered are listed, but only abnormal results are displayed) Labs Reviewed  CBG MONITORING, ED - Abnormal; Notable for the following components:      Result Value   Glucose-Capillary 138 (*)    All other components within normal limits  RESP PANEL BY RT-PCR (FLU A&B, COVID) ARPGX2  LACTIC ACID, PLASMA  LACTIC ACID, PLASMA  COMPREHENSIVE METABOLIC PANEL  CBC WITH DIFFERENTIAL/PLATELET  URINALYSIS, ROUTINE W REFLEX MICROSCOPIC     EKG None  Radiology No results found.  Procedures Procedures  {Document cardiac monitor, telemetry assessment procedure when appropriate:1}  Medications Ordered in ED Medications - No data to display  ED Course/ Medical Decision Making/ A&P                           Medical Decision Making Amount and/or Complexity of Data Reviewed Labs: ordered. Radiology: ordered.   ***  {Document critical care time when appropriate:1} {Document review of labs and clinical decision tools ie heart score, Chads2Vasc2 etc:1}  {Document your independent review of radiology images, and any outside records:1} {Document your discussion with family members, caretakers, and with consultants:1} {Document social determinants of health affecting pt's care:1} {Document your decision making why or why not admission, treatments were needed:1} Final Clinical Impression(s) / ED Diagnoses Final diagnoses:  None    Rx / DC Orders ED Discharge Orders     None

## 2021-07-18 ENCOUNTER — Telehealth: Payer: Self-pay | Admitting: Nutrition

## 2021-07-18 ENCOUNTER — Emergency Department (HOSPITAL_BASED_OUTPATIENT_CLINIC_OR_DEPARTMENT_OTHER): Payer: HMO

## 2021-07-18 DIAGNOSIS — I7133 Infrarenal abdominal aortic aneurysm, ruptured: Secondary | ICD-10-CM | POA: Diagnosis not present

## 2021-07-18 DIAGNOSIS — I700 Atherosclerosis of aorta: Secondary | ICD-10-CM | POA: Diagnosis not present

## 2021-07-18 DIAGNOSIS — R601 Generalized edema: Secondary | ICD-10-CM | POA: Diagnosis not present

## 2021-07-18 LAB — RESP PANEL BY RT-PCR (FLU A&B, COVID) ARPGX2
Influenza A by PCR: NEGATIVE
Influenza B by PCR: NEGATIVE
SARS Coronavirus 2 by RT PCR: POSITIVE — AB

## 2021-07-18 LAB — LACTIC ACID, PLASMA: Lactic Acid, Venous: 2.2 mmol/L (ref 0.5–1.9)

## 2021-07-18 LAB — TROPONIN I (HIGH SENSITIVITY): Troponin I (High Sensitivity): 71 ng/L — ABNORMAL HIGH (ref <18)

## 2021-07-18 LAB — T4, FREE: Free T4: 1.02 ng/dL (ref 0.61–1.12)

## 2021-07-18 LAB — TSH: TSH: 1.973 u[IU]/mL (ref 0.350–4.500)

## 2021-07-18 MED ORDER — MOLNUPIRAVIR EUA 200MG CAPSULE: 4.0000 | ORAL_CAPSULE | Freq: Two times a day (BID) | ORAL | 0 refills | Status: AC

## 2021-07-18 MED ORDER — IOHEXOL 350 MG/ML SOLN
100.0000 mL | Freq: Once | INTRAVENOUS | Status: AC | PRN
Start: 2021-07-18 — End: 2021-07-18
  Administered 2021-07-18: 100 mL via INTRAVENOUS

## 2021-07-18 NOTE — ED Provider Notes (Signed)
  Physical Exam  BP 137/82   Pulse 70   Temp 99.1 F (37.3 C) (Rectal)   Resp 18   Ht '6\' 2"'$  (1.88 m)   Wt 81.5 kg   SpO2 98%   BMI 23.07 kg/m   Physical Exam Vitals and nursing note reviewed.  Constitutional:      General: He is not in acute distress.    Appearance: He is well-developed. He is not diaphoretic.  HENT:     Head: Normocephalic and atraumatic.  Cardiovascular:     Rate and Rhythm: Normal rate and regular rhythm.     Heart sounds: No murmur heard.    No friction rub.  Pulmonary:     Effort: Pulmonary effort is normal. No respiratory distress.     Breath sounds: Rales present. No wheezing.     Comments: Slight rales in the bases bilaterally Abdominal:     General: Bowel sounds are normal. There is no distension.     Palpations: Abdomen is soft.     Tenderness: There is no abdominal tenderness.  Musculoskeletal:        General: Normal range of motion.     Cervical back: Normal range of motion and neck supple.  Skin:    General: Skin is warm and dry.  Neurological:     Mental Status: He is alert and oriented to person, place, and time.     Coordination: Coordination normal.     Procedures  Procedures  ED Course / MDM  Care assumed from Dr. Armandina Gemma at shift change.  Patient presenting here with complaints of fever, generalized malaise, body aches, sore throat, and cough.  This has been worsening over the past 3 days.  He reports his mother tested positive for COVID last week, however patient has tested negative at home on 3 occasions.  Care signed out to me awaiting results of COVID testing and CT scanning of the chest, abdomen, pelvis, and soft tissues of the neck.  The studies have been performed and show edema circumferentially at the larynx as well as groundglass appearance involving the right lower lobe consistent with atypical pneumonia.  Patient's COVID test has returned positive and I suspect this is the etiology of the above CT scan findings and the  cause of his illness.  Patient's vital signs are stable.  He was initially somewhat hypotensive, but is now normotensive after receiving fluids.  Lactate mildly elevated, but improving.  At this point, I feel as though patient can be discharged as there is no hypoxia and he is in no distress.  I will prescribe molnupiravir and have him return as needed if symptoms worsen.       Veryl Speak, MD 07/18/21 [phone]

## 2021-07-18 NOTE — ED Notes (Signed)
Pt verbalizes understanding of discharge instructions. Opportunity for questioning and answers were provided. Pt discharged from ED to home with son.    

## 2021-07-18 NOTE — Telephone Encounter (Signed)
LVM on 07/11/21 that patient had "lost the dexcom app on his phone, and blood sugars are not going to his pod.  I tried calling patient several times and for several days, but no answer.  Mother called and reports that she hand her son have covid.  Told her to have him call me to see what we can do over the phone.  She said she would give him the message.

## 2021-07-18 NOTE — Discharge Instructions (Signed)
Begin taking molnupiravir as prescribed.  Take Tylenol 1000 mg rotated with ibuprofen 600 mg every 4 hours as needed for fever.  Return to the emergency department if you develop severe chest pain, difficulty breathing, or for other new and concerning symptoms.  Isolate at home for the next 5 days.  Your CT scan has the incidental findings of a small abdominal aortic aneurysm for which radiology is recommending repeat imaging in 3 years.

## 2021-07-19 ENCOUNTER — Ambulatory Visit: Payer: HMO | Admitting: Endocrinology

## 2021-07-19 ENCOUNTER — Ambulatory Visit (HOSPITAL_BASED_OUTPATIENT_CLINIC_OR_DEPARTMENT_OTHER): Payer: HMO | Admitting: General Surgery

## 2021-07-21 ENCOUNTER — Encounter (INDEPENDENT_AMBULATORY_CARE_PROVIDER_SITE_OTHER): Payer: HMO | Admitting: Ophthalmology

## 2021-07-25 DIAGNOSIS — L89150 Pressure ulcer of sacral region, unstageable: Secondary | ICD-10-CM | POA: Diagnosis not present

## 2021-07-25 DIAGNOSIS — S42102D Fracture of unspecified part of scapula, left shoulder, subsequent encounter for fracture with routine healing: Secondary | ICD-10-CM | POA: Diagnosis not present

## 2021-07-26 ENCOUNTER — Encounter (HOSPITAL_BASED_OUTPATIENT_CLINIC_OR_DEPARTMENT_OTHER): Payer: HMO | Admitting: General Surgery

## 2021-07-26 DIAGNOSIS — L97511 Non-pressure chronic ulcer of other part of right foot limited to breakdown of skin: Secondary | ICD-10-CM | POA: Diagnosis not present

## 2021-07-26 DIAGNOSIS — E11621 Type 2 diabetes mellitus with foot ulcer: Secondary | ICD-10-CM | POA: Diagnosis not present

## 2021-07-26 DIAGNOSIS — L97512 Non-pressure chronic ulcer of other part of right foot with fat layer exposed: Secondary | ICD-10-CM | POA: Diagnosis not present

## 2021-07-26 NOTE — Progress Notes (Signed)
ALYUS, MOFIELD ([id number]) Visit Report for 07/26/2021 Chief Complaint Document Details Patient Name: Date of Service: [name] [name] [facility] THA N L. 07/26/2021 7:45 A M Medical Record Number: [id number] Patient Account Number: [id number] Date of Birth/Sex: Treating RN: [birth year] (66 y.o. [name] Primary Care Provider: [name], Alyssa Other Clinician: Referring Provider: Treating Provider/Extender: Orland Jarred in Treatment: 81 Information Obtained from: Patient Chief Complaint 07/07/2020; patient is in clinic for today for review of pressure ulcers on his coccyx and left heel and diabetic foot ulcers bilaterally. Electronic Signature(s) Signed: 07/26/2021 8:08:30 AM By: Fredirick Maudlin MD FACS Entered By: Fredirick Maudlin on 07/26/2021 08:08:30 -------------------------------------------------------------------------------- Debridement Details Patient Name: Date of Service: [name] [name] [facility] THA N L. 07/26/2021 7:45 A M Medical Record Number: [id number] Patient Account Number: [id number] Date of Birth/Sex: Treating RN: 05/29/1955 (66 y.o. [name] Primary Care Provider: [name], Alyssa Other Clinician: Referring Provider: Treating Provider/Extender: Orland Jarred in Treatment: 54 Debridement Performed for Assessment: Wound #3 Right,Lateral Foot Performed By: Physician Fredirick Maudlin, MD Debridement Type: Debridement Severity of Tissue Pre Debridement: Fat layer exposed Level of Consciousness (Pre-procedure): Awake and Alert Pre-procedure Verification/Time Out Yes - 07:57 Taken: Start Time: 07:57 Pain Control: Lidocaine 4% T opical Solution T Area Debrided (L x W): otal 1 (cm) x 1.5 (cm) = 1.5 (cm) Tissue and other material debrided: Non-Viable, Slough, Skin: Epidermis, Slough Level: Skin/Epidermis Debridement Description: Selective/Open Wound Instrument: Curette Bleeding: Minimum Hemostasis  Achieved: Pressure Procedural Pain: 0 Post Procedural Pain: 0 Response to Treatment: Procedure was tolerated well Level of Consciousness (Post- Awake and Alert procedure): Post Debridement Measurements of Total Wound Length: (cm) 1 Width: (cm) 1.5 Depth: (cm) 0.1 Volume: (cm) 0.118 Character of Wound/Ulcer Post Debridement: Improved Severity of Tissue Post Debridement: Fat layer exposed Post Procedure Diagnosis Same as Pre-procedure Electronic Signature(s) Signed: 07/26/2021 8:50:00 AM By: Fredirick Maudlin MD FACS Signed: 07/26/2021 4:59:32 PM By: Adline Peals Entered By: Adline Peals on 07/26/2021 08:00:14 -------------------------------------------------------------------------------- HPI Details Patient Name: Date of Service: [name] [name] [facility] THA N L. 07/26/2021 7:45 A M Medical Record Number: [id number] Patient Account Number: [id number] Date of Birth/Sex: Treating RN: [birth year] (66 y.o. [name] Primary Care Provider: [name], Alyssa Other Clinician: Referring Provider: Treating Provider/Extender: Thana Ates, Alyssa Weeks in Treatment: 38 History of Present Illness HPI Description: ADMISSION 07/07/2020 This is a 66 year old man who is a type II diabetic. I have reviewed this in epic. It appeared that his diabetes was diagnosed in 2009 at which time he was on metformin. He was followed for a period of time by an endocrinologist with [facility] to always labeled him as a type II diabetic. Recently in epic there is been a switch to type 1 diabetes. If there were defining antibody test done I do not see them and I think the totality of the evidence would suggest that he is a type II diabetic. His recent problem started in May when he fell down 17 stairs. He suffered a scapular fracture he was hospitalized at [facility] for 9 days and then discharged to [facility]. His mother who is present said that most of these wounds seem to develop surrounding the  discharge from hospital or the stay at [facility]. He has an area on his lower sacrum which appears currently to be a stage II but per their description this is actually improved quite a bit. He has an area on the left lateral foot at roughly the  base of the fifth metatarsal, the left Achilles heel and the right fifth metatarsal head laterally they have been using topical antibiotics. Seen by primary care yesterday and placed on Keflex The patient had arterial studies on 05/05/2020. I wondered whether this had something to do with wounds on his feet at that time but I have not been able to determine this. At that time his ABI on the right was 1.09 with a TBI of 0.65 on the left 0.88 with a TBI of 0.44. Waveforms were triphasic on the right monophasic on the left. He likely has significant PAD on the left Past medical history includes type 2 diabetes poorly controlled last hemoglobin A1c at 12.7 in May, hypertension, hyperlipidemia, atrial fib on Eliquis, coronary artery disease status post CABG, left scapular fracture in May of this year, AAA repair. Bilateral leg weakness ABIs were not done in our clinic as they were just done in May of this year at [facility] 7/14; patient arrives back in clinic he has areas on the left Achilles heel, base of the left fifth metatarsal, right lateral fifth metatarsal. The area he had over the coccyx appears to be healed but this area remains vulnerable. We have been using Iodoflex. I rereviewed his ABIs which were done in May. I do not know exactly what prompted these test however they do show a TBI of 0.44 on the left and monophasic waveforms. 7/21; the patient's wounds are about the same. Certainly no healing and the nurses noted some green drainage on the right fifth metatarsal head, left Achilles a little larger. We have been using silver alginate really without any improvement. We did get an appointment with vascular surgery however its that it did the  end of August and they seem to want to repeat the noninvasive test that they have already done in May. I am not sure what the issue here is. We advised the patient's sister to call and point to the idea that they have already had noninvasive studies. The real question I have is does this patient need an angiogram. He is a diabetic and it is possible that the ABIs are falsely elevated because of medial calcification. At least on the left his TBI was abnormal 08/08/2020 upon evaluation today patient appears to be doing about the same in regard to his wounds. He still waiting the appointment with vascular. This is something that hopefully should be undertaken shortly. The 29th of this month is when he is actually scheduled although they have put in a call to move this up if at all possible. Fortunately there does not appear to be any signs of active infection at this time. No fevers, chills, nausea, vomiting, or diarrhea. 8/18; appointment with Dr. Trula Slade on 8/29. The patient has wounds on the left Achilles heel, left lateral foot and the right lateral fifth metatarsal head. We have been using Santyl under Hydrofera Blue. The surface of especially the left heel looks better to me than what I remember. 9/1; since the patient was last here he underwent angiography by Dr. Trula Slade he also underwent noninvasive studies first showing a ABI of of 0.64 with monophasic waveforms. His TBI was 0.35 with a left great toe pressure of 46 this is about the same pattern as we saw previously. The left was a lot worse than the right. He underwent an angiogram on 08/29/2020. He had multiple 80% peroneal artery stenosis which was his dominant vessel which was treated with angioplasty with no residual  stenosis. 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 The anterior tibial artery was also occluded. An attempt was made at recanalization of the anterior tibial artery however this was unsuccessful. Noted that the peroneal  artery reconstitutes the posterior tibial artery and the anterior tibial artery creating the plantar arch. The patient has areas on the Achilles left heel left lateral foot and right lateral fifth metatarsal head we have been using Santyl and Hydrofera Blue 9/22; since the patient was last here he went for repeat vascular studies. On the right his ABI was 0.94 with biphasic waveforms TBI of 0.73 with a great toe pressure of 75. On the left which is the problematic wound his ABI was 1.08 but with biphasic waveforms at the PTA. Biphasic waveforms at the dorsalis pedis his TBI was 0.63 with a great toe pressure of 65. On the left however this does represent a fairly marked improvement. We have been using Santyl and Hydrofera Blue 9/29 left Achilles area. Still is having necrotic surface. Bleeds very easily as the patient is on a combination of Eliquis aspirin and Plavix just stopped yesterday. I am not going to do any debridement until next week. We are using Santyl and Hydrofera Blue. His arterial status on the right was actually quite good. The area on the fifth metatarsal head needs additional debridement. A total contact cast in this area is not out of the question. He does describe claudication I believe with minimal activity. He is not very active X-ray we ordered last week is still pending in terms of the report 10/6 left Achilles area. In terms of the necrotic surface this is quite a bit better. There is still some debris in the posterior part of the wound however this looks better. Anteriorly the wound appears to be epithelialized there is still a divot in the middle of this but this does not probe to deep tissue. The area on the right fifth metatarsal head laterally really is not making as much progress as I would have liked. Still not completely a viable surface. Both the patient and his wife agree that he is not an external rotator in terms of ambulation [an outie]. We have been using Santyl  and Hydrofera Blue for a prolonged period. On the left Achilles which looked like it is worse wound this seems to be making some progress not so much on the right foot. The patient and his wife state that they are rigorous about offloading both of these areas He was treated with angioplasty as I believe on the right peroneal artery that had multiple stenosis. This is a dominant vessel the anterior tibial and posterior tibial arteries are occluded however the foot is fed via collaterals from the peroneal. From my notes I am not able to understand the vascular supply on the left I will have to review this. 10/13; left Achilles top part of this looks healthy granulation in the bottom part of this wound again completely necrotic requiring debridement. The area on the right lateral fifth met head also requires debridement. The majority of this is epithelialized however he she there is a refractory part to this. 10/20; left Achilles 100% better looking wound surface. Necrotic upper 50% seems to have resolved at least by inspection under illumination. Culture I did of this area last week postdebridement showed Proteus and Citrobacter. Both sensitive to quinolones and I put him on ciprofloxacin. We have been using silver alginate on the wound on the heel and Santyl and Hydrofera Blue  on the right fifth met head. 10/27; left Achilles is measuring much smaller we are using topical gentamicin under silver alginate. Right fifth met head still fibrinous debris on the surface we are using Santyl and Hydrofera Blue. Patient lost his brother suddenly last week he was unable to keep his vascular surgery appointment. 11/3; left Achilles not as viable surface as I might like. Some hypergranulation. Right fifth metatarsal head still completely nonviable surface. It turns out that she was not using topical gentamicin on the left heel [mother] they have been using Santyl and Hydrofera Blue on the right fifth metatarsal  head. The patient did have noninvasive vascular studies on 10/ 28/22. He had triphasic waveforms up to the level of the proximal PTA and then a 50 to 74% stenosis in the mid PTA. Peroneal artery was triphasic as was the anterior tibial. He follows up with Dr. Trula Slade on 11/7. 11/17; since the patient was last here he saw Dr. Trula Slade and went for ANGIOGRAPHY on 11/15/2020. He has wounds on the left Achilles heel and the right lateral fifth metatarsal head. He has known PAD and had previous revascularization in August. On this occasion on the right his right common femoral, profundofemoral and superficial femoral and popliteal arteries were widely patent the dominant vessel is the posterior tibial which had several areas of stenosis greater than 80% he went underwent angioplasty in this area. He had no interventions on the left however he is left common femoral, profundofemoral and superficial femoral arteries were widely patent as well as the popliteal he has recurrent stenosis greater than 50% within the peroneal artery which is a single- vessel runoff. The thought was that he may require further angiography and reangioplasty on the left peroneal artery. Overall the patient has severe PAD he has undergone angioplasty of the right posterior tibial artery. He arrives in clinic today with the Achilles heel area on the left surprisingly better with marked improvement in the surface area on the right not nearly as good but stable 12/8; patient has follow-up arterial Doppler studies on 12/19 at the [facility]. He is now being revascularized on both sides. Fortunately on the right Achilles area this is just about closed slight scabbing/eschar. I did not remove this today. The area on the right fifth met head looked better after last week's debridement we have been using Santyl and Hydrofera Blue 01/05/21 the patient has not been here in almost a month. He was supposed to have follow-up Doppler studies on  12/19 look these up. He has been revascularized on both sides The left Achilles area is closed he does not need to dress this but will need to protect this area on an ongoing basis likely indefinitely. He still has the area on the right fifth plantar metatarsal head. The area is measuring smaller he has a rim of epithelialization. The granulation tissue still a bit gelatinous for my liking 1/26; The left Achilles remains closed. Plantar right fifth metatarsal head is smaller and appears to be healthy. He has not followed up with vascular surgery and does not seem particularly interested. He did not get his follow-up arterial Dopplers. I explained to him that angioplasty of the small arteries in his leg is a limb saving procedure but possibility of restenosis is reasonably high and he should follow-up with follow-up noninvasive studies. Frankly he did not seem interested today 03/30/2021: The patient has not been seen here since the end of January. He never followed up with vascular surgery  and today states that he refuses to be seen at [facility] due to his dissatisfaction with the fact that his arteries seem to restenose. The wound on his left Achilles is still closed. The plantar right fifth metatarsal head is quite a bit bigger than it was at his last visit. He is accompanied by his mother who reports that it had gotten down to a very small size but then she went out of town for a week and during that time it enlarged to its current dimensions. The patient is wearing the functional equivalent of house slippers for shoes and is not making any effort to offload. He refuses to wear a forefoot offloading sandal due to instability on his feet and says that when a total contact cast was attempted, it was "a total disaster" and he is unwilling to consider this again. He has been in PolyMem silver since at least the beginning of January. 04/26/2021: The patient is here today after about a month. He has been using  Hydrofera Blue to his wound and today it is markedly improved. It is small and clean. He is scheduled to see Dr. Gwenlyn Found in interventional cardiology tomorrow. 05/24/2021: Once again, the patient returns after about a month. I had referred him to Dr. Gwenlyn Found and the patient walked out from that appointment. Apparently there was some confusion over why he was seeing the cardiologist for his peripheral arterial disease. He saw his primary cardiologist, Dr. Davina Poke, last week and has been re-referred to see Dr. Gwenlyn Found, the appointment is tomorrow. Basically the wound is a little bit bigger today but clean without any slough accumulation. The periwound callus that has accumulated is completely macerated. I thought he had been in [facility], but the dressing that had been removed and discarded today looks more like PolyMem Ag. 05/31/2021: The patient decided not to see Dr. Gwenlyn Found because he feels like anytime he has a vascular intervention, the arteries just closed up again and he does not want to do this anymore. He does not really grasp the nature of the disease process. The wound as a bit smaller today and very clean. No accumulation of periwound callus. We have been using Hydrofera Blue. 06/07/2021: The wound looks good today. It is a little bit smaller without any significant periwound callus accumulation. The surface is clean with good granulation tissue. 06/20/2021: The patient's mother was out of town last week and so he could not come to clinic. He did have help with his wound care during her absence. Today, the wound looks very good. It is a little bit smaller. There is some slough accumulation on the surface. 06/27/2021: The wound is about the same size today, but the surface is fairly clean. There is some dead skin around the perimeter, but no real callus accumulation. Thin layer of slough. 07/11/2021: The wound is now flush with the surrounding skin. It has contracted considerably. There is just a  light layer of slough and biofilm on the surface. 07/26/2021: The patient was absent from clinic last week due to illness. Today, the skin around the wound appears to have gotten macerated and the wound is larger, as a result. It remains flush with the surrounding skin and has a nice beefy surface. Electronic Signature(s) Signed: 07/26/2021 8:09:20 AM By: Fredirick Maudlin MD FACS Entered By: Fredirick Maudlin on 07/26/2021 08:09:19 -------------------------------------------------------------------------------- Physical Exam Details Patient Name: Date of Service: [name] [name] [facility] THA N L. 07/26/2021 7:45 A M Medical Record Number: [id number] Patient Account  Number: [id number] Date of Birth/Sex: Treating RN: 11/20/1955 (66 y.o. [name] Primary Care Provider: [name], Alyssa Other Clinician: Referring Provider: Treating Provider/Extender: Fredirick Maudlin [name], Alyssa Weeks in Treatment: 47 Constitutional . Slightly bradycardic, asymptomatic.. . . No acute distress.Marland Kitchen Respiratory Normal work of breathing on room air.. Notes 07/26/2021: Today, the skin around the wound appears to have gotten macerated and the wound is larger, as a result. It remains flush with the surrounding skin and has a nice beefy surface. Electronic Signature(s) Signed: 07/26/2021 8:10:02 AM By: Fredirick Maudlin MD FACS Entered By: Fredirick Maudlin on 07/26/2021 08:10:02 -------------------------------------------------------------------------------- Physician Orders Details Patient Name: Date of Service: [name] [name] [facility] THA N L. 07/26/2021 7:45 A M Medical Record Number: [id number] Patient Account Number: [id number] Date of Birth/Sex: Treating RN: 31-Oct-1955 (66 y.o. [name] Primary Care Provider: [name], Alyssa Other Clinician: Referring Provider: Treating Provider/Extender: Orland Jarred in Treatment: 59 Verbal / Phone Orders: No Diagnosis Coding ICD-10  Coding Code Description L97.511 Non-pressure chronic ulcer of other part of right foot limited to breakdown of skin E11.621 Type 2 diabetes mellitus with foot ulcer E11.51 Type 2 diabetes mellitus with diabetic peripheral angiopathy without gangrene E11.42 Type 2 diabetes mellitus with diabetic polyneuropathy Follow-up Appointments ppointment in 2 weeks. - Dr. Celine Ahr - Room 2 - 8/9 at 7:45 AM Return A Bathing/ Shower/ Hygiene May shower and wash wound with soap and water. - with dressing changes only. Edema Control - Lymphedema / SCD / Other Elevate legs to the level of the heart or above for 30 minutes daily and/or when sitting, a frequency of: - 3-4 times a day throughout the day. Moisturize legs daily. - every night before bed. *** closely monitor feet every night before bed. Off-Loading Other: - float heels while resting in bed or chair. Use a pillow or rolled sheet under legs to offload the heels. Ensure no pressure to right lateral foot. Home Health No change in wound care orders this week; continue Home Health for wound care. May utilize formulary equivalent dressing for wound treatment orders unless otherwise specified. Other Home Health Orders/Instructions: - [facility] home health weekly changes and all other days family to change. Wound Treatment Wound #3 - Foot Wound Laterality: Right, Lateral Cleanser: Soap and Water Every Other Day/7 Days Discharge Instructions: May shower and wash wound with dial antibacterial soap and water prior to dressing change. Cleanser: Wound Cleanser Every Other Day/7 Days Discharge Instructions: Cleanse the wound with wound cleanser prior to applying a clean dressing using gauze sponges, not tissue or cotton balls. Peri-Wound Care: Zinc Oxide Ointment 30g tube Every Other Day/7 Days Discharge Instructions: Apply Zinc Oxide to periwound with each dressing change Prim Dressing: Promogran Prisma Matrix, 4.34 (sq in) (silver collagen) (Generic) Every  Other Day/7 Days ary Discharge Instructions: Moisten collagen with saline or hydrogel Secondary Dressing: ALLEVYN Gentle Border, 3x3 (in/in) Every Other Day/7 Days Discharge Instructions: Apply over primary dressing as directed. Electronic Signature(s) Signed: 07/26/2021 8:50:00 AM By: Fredirick Maudlin MD FACS Entered By: Fredirick Maudlin on 07/26/2021 08:10:13 -------------------------------------------------------------------------------- Problem List Details Patient Name: Date of Service: [name] [name] [facility] THA N L. 07/26/2021 7:45 A M Medical Record Number: [id number] Patient Account Number: [id number] Date of Birth/Sex: Treating RN: Jan 02, 1955 (66 y.o. [name] Primary Care Provider: [name], Alyssa Other Clinician: Referring Provider: Treating Provider/Extender: Orland Jarred in Treatment: 33 Active Problems ICD-10 Encounter Code Description Active Date MDM Diagnosis L97.511 Non-pressure chronic ulcer of other part  of right foot limited to breakdown of 07/07/2020 No Yes skin E11.621 Type 2 diabetes mellitus with foot ulcer 07/07/2020 No Yes E11.51 Type 2 diabetes mellitus with diabetic peripheral angiopathy without gangrene 09/01/2020 No Yes E11.42 Type 2 diabetes mellitus with diabetic polyneuropathy 07/07/2020 No Yes Inactive Problems ICD-10 Code Description Active Date Inactive Date L97.528 Non-pressure chronic ulcer of other part of left foot with other specified severity 07/07/2020 07/07/2020 L89.152 Pressure ulcer of sacral region, stage 2 07/07/2020 07/07/2020 Y19.509 Pressure ulcer of left heel, stage 2 07/07/2020 07/07/2020 Resolved Problems Electronic Signature(s) Signed: 07/26/2021 8:08:17 AM By: Fredirick Maudlin MD FACS Entered By: Fredirick Maudlin on 07/26/2021 08:08:17 -------------------------------------------------------------------------------- Progress Note Details Patient Name: Date of Service: [name] [name] [facility] THA N L. 07/26/2021 7:45  A M Medical Record Number: [id number] Patient Account Number: [id number] Date of Birth/Sex: Treating RN: [birth year] (66 y.o. [name] Primary Care Provider: [name], Alyssa Other Clinician: Referring Provider: Treating Provider/Extender: Orland Jarred in Treatment: 44 Subjective Chief Complaint Information obtained from Patient 07/07/2020; patient is in clinic for today for review of pressure ulcers on his coccyx and left heel and diabetic foot ulcers bilaterally. History of Present Illness (HPI) ADMISSION 07/07/2020 This is a 66 year old man who is a type II diabetic. I have reviewed this in epic. It appeared that his diabetes was diagnosed in 2009 at which time he was on metformin. He was followed for a period of time by an endocrinologist with [facility] to always labeled him as a type II diabetic. Recently in epic there is been a switch to type 1 diabetes. If there were defining antibody test done I do not see them and I think the totality of the evidence would suggest that he is a type II diabetic. His recent problem started in May when he fell down 17 stairs. He suffered a scapular fracture he was hospitalized at [facility] for 9 days and then discharged to [facility]. His mother who is present said that most of these wounds seem to develop surrounding the discharge from hospital or the stay at [facility]. He has an area on his lower sacrum which appears currently to be a stage II but per their description this is actually improved quite a bit. He has an area on the left lateral foot at roughly the base of the fifth metatarsal, the left Achilles heel and the right fifth metatarsal head laterally they have been using topical antibiotics. Seen by primary care yesterday and placed on Keflex The patient had arterial studies on 05/05/2020. I wondered whether this had something to do with wounds on his feet at that time but I have not been able to determine this. At  that time his ABI on the right was 1.09 with a TBI of 0.65 on the left 0.88 with a TBI of 0.44. Waveforms were triphasic on the right monophasic on the left. He likely has significant PAD on the left Past medical history includes type 2 diabetes poorly controlled last hemoglobin A1c at 12.7 in May, hypertension, hyperlipidemia, atrial fib on Eliquis, coronary artery disease status post CABG, left scapular fracture in May of this year, AAA repair. Bilateral leg weakness ABIs were not done in our clinic as they were just done in May of this year at [facility] 7/14; patient arrives back in clinic he has areas on the left Achilles heel, base of the left fifth metatarsal, right lateral fifth metatarsal. The area he had over the coccyx appears to  be healed but this area remains vulnerable. We have been using Iodoflex. I rereviewed his ABIs which were done in May. I do not know exactly what prompted these test however they do show a TBI of 0.44 on the left and monophasic waveforms. 7/21; the patient's wounds are about the same. Certainly no healing and the nurses noted some green drainage on the right fifth metatarsal head, left Achilles a little larger. We have been using silver alginate really without any improvement. We did get an appointment with vascular surgery however its that it did the end of August and they seem to want to repeat the noninvasive test that they have already done in May. I am not sure what the issue here is. We advised the patient's sister to call and point to the idea that they have already had noninvasive studies. The real question I have is does this patient need an angiogram. He is a diabetic and it is possible that the ABIs are falsely elevated because of medial calcification. At least on the left his TBI was abnormal 08/08/2020 upon evaluation today patient appears to be doing about the same in regard to his wounds. He still waiting the appointment with vascular. This  is something that hopefully should be undertaken shortly. The 29th of this month is when he is actually scheduled although they have put in a call to move this up if at all possible. Fortunately there does not appear to be any signs of active infection at this time. No fevers, chills, nausea, vomiting, or diarrhea. 8/18; appointment with Dr. Trula Slade on 8/29. The patient has wounds on the left Achilles heel, left lateral foot and the right lateral fifth metatarsal head. We have been using Santyl under Hydrofera Blue. The surface of especially the left heel looks better to me than what I remember. 9/1; since the patient was last here he underwent angiography by Dr. Trula Slade he also underwent noninvasive studies first showing a ABI of of 0.64 with monophasic waveforms. His TBI was 0.35 with a left great toe pressure of 46 this is about the same pattern as we saw previously. The left was a lot worse than the right. He underwent an angiogram on 08/29/2020. He had multiple 80% peroneal artery stenosis which was his dominant vessel which was treated with angioplasty with no residual stenosis. 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 The anterior tibial artery was also occluded. An attempt was made at recanalization of the anterior tibial artery however this was unsuccessful. Noted that the peroneal artery reconstitutes the posterior tibial artery and the anterior tibial artery creating the plantar arch. The patient has areas on the Achilles left heel left lateral foot and right lateral fifth metatarsal head we have been using Santyl and Hydrofera Blue 9/22; since the patient was last here he went for repeat vascular studies. On the right his ABI was 0.94 with biphasic waveforms TBI of 0.73 with a great toe pressure of 75. On the left which is the problematic wound his ABI was 1.08 but with biphasic waveforms at the PTA. Biphasic waveforms at the dorsalis pedis his TBI was 0.63 with a great toe pressure  of 65. On the left however this does represent a fairly marked improvement. We have been using Santyl and Hydrofera Blue 9/29 left Achilles area. Still is having necrotic surface. Bleeds very easily as the patient is on a combination of Eliquis aspirin and Plavix just stopped yesterday. I am not going to do any debridement until  next week. We are using Santyl and Hydrofera Blue. His arterial status on the right was actually quite good. The area on the fifth metatarsal head needs additional debridement. A total contact cast in this area is not out of the question. He does describe claudication I believe with minimal activity. He is not very active X-ray we ordered last week is still pending in terms of the report 10/6 left Achilles area. In terms of the necrotic surface this is quite a bit better. There is still some debris in the posterior part of the wound however this looks better. Anteriorly the wound appears to be epithelialized there is still a divot in the middle of this but this does not probe to deep tissue. The area on the right fifth metatarsal head laterally really is not making as much progress as I would have liked. Still not completely a viable surface. Both the patient and his wife agree that he is not an external rotator in terms of ambulation [an outie]. We have been using Santyl and Hydrofera Blue for a prolonged period. On the left Achilles which looked like it is worse wound this seems to be making some progress not so much on the right foot. The patient and his wife state that they are rigorous about offloading both of these areas He was treated with angioplasty as I believe on the right peroneal artery that had multiple stenosis. This is a dominant vessel the anterior tibial and posterior tibial arteries are occluded however the foot is fed via collaterals from the peroneal. From my notes I am not able to understand the vascular supply on the left I will have to review  this. 10/13; left Achilles top part of this looks healthy granulation in the bottom part of this wound again completely necrotic requiring debridement. oo The area on the right lateral fifth met head also requires debridement. The majority of this is epithelialized however he she there is a refractory part to this. 10/20; left Achilles 100% better looking wound surface. Necrotic upper 50% seems to have resolved at least by inspection under illumination. Culture I did of this area last week postdebridement showed Proteus and Citrobacter. Both sensitive to quinolones and I put him on ciprofloxacin. We have been using silver alginate on the wound on the heel and Santyl and Hydrofera Blue on the right fifth met head. 10/27; left Achilles is measuring much smaller we are using topical gentamicin under silver alginate. Right fifth met head still fibrinous debris on the surface we are using Santyl and Hydrofera Blue. Patient lost his brother suddenly last week he was unable to keep his vascular surgery appointment. 11/3; left Achilles not as viable surface as I might like. Some hypergranulation. Right fifth metatarsal head still completely nonviable surface. It turns out that she was not using topical gentamicin on the left heel [mother] they have been using Santyl and Hydrofera Blue on the right fifth metatarsal head. The patient did have noninvasive vascular studies on 10/ 28/22. He had triphasic waveforms up to the level of the proximal PTA and then a 50 to 74% stenosis in the mid PTA. Peroneal artery was triphasic as was the anterior tibial. He follows up with Dr. Trula Slade on 11/7. 11/17; since the patient was last here he saw Dr. Trula Slade and went for ANGIOGRAPHY on 11/15/2020. He has wounds on the left Achilles heel and the right lateral fifth metatarsal head. He has known PAD and had previous revascularization in August. On this occasion  on the right his right common femoral, profundofemoral and  superficial femoral and popliteal arteries were widely patent the dominant vessel is the posterior tibial which had several areas of stenosis greater than 80% he went underwent angioplasty in this area. He had no interventions on the left however he is left common femoral, profundofemoral and superficial femoral arteries were widely patent as well as the popliteal he has recurrent stenosis greater than 50% within the peroneal artery which is a single- vessel runoff. The thought was that he may require further angiography and reangioplasty on the left peroneal artery. Overall the patient has severe PAD he has undergone angioplasty of the right posterior tibial artery. He arrives in clinic today with the Achilles heel area on the left surprisingly better with marked improvement in the surface area on the right not nearly as good but stable 12/8; patient has follow-up arterial Doppler studies on 12/[facility] He is now being revascularized on both sides. Fortunately on the right Achilles area this is just about closed slight scabbing/eschar. I did not remove this today. The area on the right fifth met head looked better after last week's debridement we have been using Santyl and Hydrofera Blue 01/05/21 the patient has not been here in almost a month. He was supposed to have follow-up Doppler studies on 12/19 look these up. He has been revascularized on both sides The left Achilles area is closed he does not need to dress this but will need to protect this area on an ongoing basis likely indefinitely. He still has the area on the right fifth plantar metatarsal head. The area is measuring smaller he has a rim of epithelialization. The granulation tissue still a bit gelatinous for my liking 1/26; The left Achilles remains closed. Plantar right fifth metatarsal head is smaller and appears to be healthy. He has not followed up with vascular surgery and does not seem particularly interested. He  did not get his follow-up arterial Dopplers. I explained to him that angioplasty of the small arteries in his leg is a limb saving procedure but possibility of restenosis is reasonably high and he should follow-up with follow-up noninvasive studies. Frankly he did not seem interested today 03/30/2021: The patient has not been seen here since the end of January. He never followed up with vascular surgery and today states that he refuses to be seen at [facility] due to his dissatisfaction with the fact that his arteries seem to restenose. The wound on his left Achilles is still closed. The plantar right fifth metatarsal head is quite a bit bigger than it was at his last visit. He is accompanied by his mother who reports that it had gotten down to a very small size but then she went out of town for a week and during that time it enlarged to its current dimensions. The patient is wearing the functional equivalent of house slippers for shoes and is not making any effort to offload. He refuses to wear a forefoot offloading sandal due to instability on his feet and says that when a total contact cast was attempted, it was "a total disaster" and he is unwilling to consider this again. He has been in PolyMem silver since at least the beginning of January. 04/26/2021: The patient is here today after about a month. He has been using Hydrofera Blue to his wound and today it is markedly improved. It is small and clean. He is scheduled to see Dr. Gwenlyn Found in interventional cardiology  tomorrow. 05/24/2021: Once again, the patient returns after about a month. I had referred him to Dr. Gwenlyn Found and the patient walked out from that appointment. Apparently there was some confusion over why he was seeing the cardiologist for his peripheral arterial disease. He saw his primary cardiologist, Dr. Davina Poke, last week and has been re-referred to see Dr. Gwenlyn Found, the appointment is tomorrow. Basically the wound is a little bit bigger today but  clean without any slough accumulation. The periwound callus that has accumulated is completely macerated. I thought he had been in [facility], but the dressing that had been removed and discarded today looks more like PolyMem Ag. 05/31/2021: The patient decided not to see Dr. Gwenlyn Found because he feels like anytime he has a vascular intervention, the arteries just closed up again and he does not want to do this anymore. He does not really grasp the nature of the disease process. The wound as a bit smaller today and very clean. No accumulation of periwound callus. We have been using Hydrofera Blue. 06/07/2021: The wound looks good today. It is a little bit smaller without any significant periwound callus accumulation. The surface is clean with good granulation tissue. 06/20/2021: The patient's mother was out of town last week and so he could not come to clinic. He did have help with his wound care during her absence. Today, the wound looks very good. It is a little bit smaller. There is some slough accumulation on the surface. 06/27/2021: The wound is about the same size today, but the surface is fairly clean. There is some dead skin around the perimeter, but no real callus accumulation. Thin layer of slough. 07/11/2021: The wound is now flush with the surrounding skin. It has contracted considerably. There is just a light layer of slough and biofilm on the surface. 07/26/2021: The patient was absent from clinic last week due to illness. T oday, the skin around the wound appears to have gotten macerated and the wound is larger, as a result. It remains flush with the surrounding skin and has a nice beefy surface. Patient History Information obtained from Patient. Family History Cancer - Paternal Grandparents,Father, Heart Disease - Father, Hypertension - Father, No family history of Diabetes, Hereditary Spherocytosis, Kidney Disease, Lung Disease, Seizures, Stroke, Thyroid Problems, Tuberculosis. Social  History Current some day smoker - Landscape architect, Marital Status - Divorced, Alcohol Use - Rarely, Drug Use - No History, Caffeine Use - Daily. Medical History Cardiovascular Patient has history of Coronary Artery Disease, Hypertension Endocrine Patient has history of Type II Diabetes Musculoskeletal Patient has history of Osteoarthritis Neurologic Patient has history of Neuropathy Objective Constitutional Slightly bradycardic, asymptomatic.Marland Kitchen No acute distress.. Vitals Time Taken: 7:47 AM, Height: 74 in, Weight: 151 lbs, BMI: 19.4, Temperature: 98.9 F, Pulse: 55 bpm, Respiratory Rate: 16 breaths/min, Blood Pressure: 124/66 mmHg. Respiratory Normal work of breathing on room air.. General Notes: 07/26/2021: Today, the skin around the wound appears to have gotten macerated and the wound is larger, as a result. It remains flush with the surrounding skin and has a nice beefy surface. Integumentary (Hair, Skin) Wound #3 status is Open. Original cause of wound was Gradually Appeared. The date acquired was: 06/21/2020. The wound has been in treatment 54 weeks. The wound is located on the Right,Lateral Foot. The wound measures 1cm length x 1.5cm width x 0.1cm depth; 1.178cm^2 area and 0.118cm^3 volume. There is Fat Layer (Subcutaneous Tissue) exposed. There is no tunneling or undermining noted. There is a medium amount of serosanguineous  drainage noted. The wound margin is flat and intact. There is large (67-100%) red, pink granulation within the wound bed. There is a small (1-33%) amount of necrotic tissue within the wound bed including Adherent Slough. Assessment Active Problems ICD-10 Non-pressure chronic ulcer of other part of right foot limited to breakdown of skin Type 2 diabetes mellitus with foot ulcer Type 2 diabetes mellitus with diabetic peripheral angiopathy without gangrene Type 2 diabetes mellitus with diabetic polyneuropathy Procedures Wound #3 Pre-procedure diagnosis of Wound #3 is  a Diabetic Wound/Ulcer of the Lower Extremity located on the Right,Lateral Foot .Severity of Tissue Pre Debridement is: Fat layer exposed. There was a Selective/Open Wound Skin/Epidermis Debridement with a total area of 1.5 sq cm performed by Fredirick Maudlin, MD. With the following instrument(s): Curette to remove Non-Viable tissue/material. Material removed includes [facility] and Skin: Epidermis and after achieving pain control using Lidocaine 4% Topical Solution. No specimens were taken. A time out was conducted at 07:57, prior to the start of the procedure. A Minimum amount of bleeding was controlled with Pressure. The procedure was tolerated well with a pain level of 0 throughout and a pain level of 0 following the procedure. Post Debridement Measurements: 1cm length x 1.5cm width x 0.1cm depth; 0.118cm^3 volume. Character of Wound/Ulcer Post Debridement is improved. Severity of Tissue Post Debridement is: Fat layer exposed. Post procedure Diagnosis Wound #3: Same as Pre-Procedure Plan Follow-up Appointments: Return Appointment in 2 weeks. - Dr. Celine Ahr - Room 2 - 8/9 at 7:45 AM Bathing/ Shower/ Hygiene: May shower and wash wound with soap and water. - with dressing changes only. Edema Control - Lymphedema / SCD / Other: Elevate legs to the level of the heart or above for 30 minutes daily and/or when sitting, a frequency of: - 3-4 times a day throughout the day. Moisturize legs daily. - every night before bed. *** closely monitor feet every night before bed. Off-Loading: Other: - float heels while resting in bed or chair. Use a pillow or rolled sheet under legs to offload the heels. Ensure no pressure to right lateral foot. Home Health: No change in wound care orders this week; continue Home Health for wound care. May utilize formulary equivalent dressing for wound treatment orders unless otherwise specified. Other Home Health Orders/Instructions: - [facility] home health weekly changes and all  other days family to change. WOUND #3: - Foot Wound Laterality: Right, Lateral Cleanser: Soap and Water Every Other Day/7 Days Discharge Instructions: May shower and wash wound with dial antibacterial soap and water prior to dressing change. Cleanser: Wound Cleanser Every Other Day/7 Days Discharge Instructions: Cleanse the wound with wound cleanser prior to applying a clean dressing using gauze sponges, not tissue or cotton balls. Peri-Wound Care: Zinc Oxide Ointment 30g tube Every Other Day/7 Days Discharge Instructions: Apply Zinc Oxide to periwound with each dressing change Prim Dressing: Promogran Prisma Matrix, 4.34 (sq in) (silver collagen) (Generic) Every Other Day/7 Days ary Discharge Instructions: Moisten collagen with saline or hydrogel Secondary Dressing: ALLEVYN Gentle Border, 3x3 (in/in) Every Other Day/7 Days Discharge Instructions: Apply over primary dressing as directed. 07/26/2021: Today, the skin around the wound appears to have gotten macerated and the wound is larger, as a result. It remains flush with the surrounding skin and has a nice beefy surface. I used a curette to debride slough and the hanging pieces of macerated skin from around the wound. I discussed with the patient's mother, who does his dressings, that it is imperative that she keep the  moistened Prisma collagen off of the surrounding skin. I have also recommended that they apply zinc oxide to protect the periwound skin from additional moisture. They will follow-up in 2 weeks. Electronic Signature(s) Signed: 07/26/2021 8:10:57 AM By: Fredirick Maudlin MD FACS Entered By: Fredirick Maudlin on 07/26/2021 08:10:57 -------------------------------------------------------------------------------- HxROS Details Patient Name: Date of Service: [name] [name] HNA THA N L. 07/26/2021 7:45 A M Medical Record Number: [id number] Patient Account Number: [id number] Date of Birth/Sex: Treating RN: 11/22/1955 (66 y.o. [name] Primary Care Provider: [name], Alyssa Other Clinician: Referring Provider: Treating Provider/Extender: Orland Jarred in Treatment: 74 Information Obtained From Patient Cardiovascular Medical History: Positive for: Coronary Artery Disease; Hypertension Endocrine Medical History: Positive for: Type II Diabetes Treated with: Insulin Blood sugar tested every day: Yes Tested : CGM Musculoskeletal Medical History: Positive for: Osteoarthritis Neurologic Medical History: Positive for: Neuropathy Immunizations Pneumococcal Vaccine: Received Pneumococcal Vaccination: Yes Received Pneumococcal Vaccination On or After 60th Birthday: Yes Implantable Devices None Family and Social History Cancer: Yes - Paternal Grandparents,Father; Diabetes: No; Heart Disease: Yes - Father; Hereditary Spherocytosis: No; Hypertension: Yes - Father; Kidney Disease: No; Lung Disease: No; Seizures: No; Stroke: No; Thyroid Problems: No; Tuberculosis: No; Current some day smoker - Cigars; Marital Status - Divorced; Alcohol Use: Rarely; Drug Use: No History; Caffeine Use: Daily; Financial Concerns: No; Food, Clothing or Shelter Needs: No; Support System Lacking: No; Transportation Concerns: No Electronic Signature(s) Signed: 07/26/2021 8:50:00 AM By: Fredirick Maudlin MD FACS Signed: 07/26/2021 4:59:32 PM By: Sabas Sous By: Fredirick Maudlin on 07/26/2021 08:09:25 -------------------------------------------------------------------------------- SuperBill Details Patient Name: Date of Service: [name] [name] HNA THA N L. 07/26/2021 Medical Record Number: [id number] Patient Account Number: [id number] Date of Birth/Sex: Treating RN: [birth year] (66 y.o. [name] Primary Care Provider: [name], Alyssa Other Clinician: Referring Provider: Treating Provider/Extender: Thana Ates, Alyssa Weeks in Treatment: 69 Diagnosis Coding ICD-10  Codes Code Description L97.511 Non-pressure chronic ulcer of other part of right foot limited to breakdown of skin E11.621 Type 2 diabetes mellitus with foot ulcer E11.51 Type 2 diabetes mellitus with diabetic peripheral angiopathy without gangrene E11.42 Type 2 diabetes mellitus with diabetic polyneuropathy Facility Procedures Physician Procedures : CPT4 Code Description Modifier [id number] - WC PHYS LEVEL 3 - EST PT 25 ICD-10 Diagnosis Description L97.511 Non-pressure chronic ulcer of other part of right foot limited to breakdown of skin E11.621 Type 2 diabetes mellitus with foot ulcer E11.51  Type 2 diabetes mellitus with diabetic peripheral angiopathy without gangrene E11.42 Type 2 diabetes mellitus with diabetic polyneuropathy Quantity: 1 : [id number] - WC PHYS DEBR WO ANESTH 20 SQ CM ICD-10 Diagnosis Description L97.511 Non-pressure chronic ulcer of other part of right foot limited to breakdown of skin Quantity: 1 Electronic Signature(s) Signed: 07/26/2021 8:12:36 AM By: Fredirick Maudlin MD FACS Entered By: Fredirick Maudlin on 07/26/2021 08:12:36

## 2021-07-26 NOTE — Progress Notes (Signed)
ACXEL, DINGEE ([id number]) Visit Report for 07/26/2021 Arrival Information Details Patient Name: Date of Service: [name] [name] [facility] THA N L. 07/26/2021 7:45 A M Medical Record Number: [id number] Patient Account Number: [id number] Date of Birth/Sex: Treating RN: 01/14/1955 (66 y.o. [name] Primary Care Avett Reineck: [name], Alyssa Other Clinician: Referring Laiklyn Pilkenton: Treating Evans Levee/Extender: Orland Jarred in Treatment: 14 Visit Information History Since Last Visit Added or deleted any medications: No Patient Arrived: Walker Any new allergies or adverse reactions: No Arrival Time: 07:44 Had a fall or experienced change in No Accompanied By: mother activities of daily living that may affect Transfer Assistance: None risk of falls: Patient Identification Verified: Yes Signs or symptoms of abuse/neglect since last visito No Secondary Verification Process Completed: Yes Hospitalized since last visit: No Patient Requires Transmission-Based Precautions: No Implantable device outside of the clinic excluding No Patient Has Alerts: Yes cellular tissue based products placed in the center Patient Alerts: Patient on Blood Thinner since last visit: Has Dressing in Place as Prescribed: Yes Pain Present Now: Yes Electronic Signature(s) Signed: 07/26/2021 4:59:32 PM By: Adline Peals Entered By: Adline Peals on 07/26/2021 07:47:27 -------------------------------------------------------------------------------- Encounter Discharge Information Details Patient Name: Date of Service: [name] [name] [facility] TH[facility] N L. 07/26/2021 7:45 A M Medical Record Number: [id number] Patient Account Number: [id number] Date of Birth/Sex: Treating RN: November 10, 1955 (66 y.o. [name] Primary Care Ziyon Cedotal: [name], Alyssa Other Clinician: Referring Yanique Mulvihill: Treating Angelina Neece/Extender: Thana Ates, Alyssa Weeks in Treatment: 56 Encounter  Discharge Information Items Post Procedure Vitals Discharge Condition: Stable Temperature (F): 98.9 Ambulatory Status: Walker Pulse (bpm): 55 Discharge Destination: Home Respiratory Rate (breaths/min): 16 Transportation: Private Auto Blood Pressure (mmHg): 124/66 Accompanied By: mother Schedule Follow-up Appointment: Yes Clinical Summary of Care: Patient Declined Electronic Signature(s) Signed: 07/26/2021 4:59:32 PM By: Adline Peals Entered By: Adline Peals on 07/26/2021 08:22:43 -------------------------------------------------------------------------------- Lower Extremity Assessment Details Patient Name: Date of Service: [name] [name] [facility] N L. 07/26/2021 7:45 A M Medical Record Number: [id number] Patient Account Number: [id number] Date of Birth/Sex: Treating RN: Apr 10, 1955 (66 y.o. [name] Primary Care Santosha Jividen: [name], Alyssa Other Clinician: Referring Jurni Cesaro: Treating Daryel Kenneth/Extender: Fredirick Maudlin [name], Alyssa Weeks in Treatment: 54 Edema Assessment Assessed: [Left: No] [Right: No] Edema: [Left: Ye] [Right: s] Calf Left: Right: Point of Measurement: 34 cm From Medial Instep 30.8 cm Ankle Left: Right: Point of Measurement: 10 cm From Medial Instep 23.4 cm Vascular Assessment Pulses: Dorsalis Pedis Palpable: [Right:Yes] Electronic Signature(s) Signed: 07/26/2021 4:59:32 PM By: Adline Peals Entered By: Adline Peals on 07/26/2021 07:50:31 -------------------------------------------------------------------------------- Multi Wound Chart Details Patient Name: Date of Service: [name] [name] [facility] TH[facility] N L. 07/26/2021 7:45 A M Medical Record Number: [id number] Patient Account Number: [id number] Date of Birth/Sex: Treating RN: 1955/03/14 (66 y.o. [name] Primary Care Timira Bieda: [name], Alyssa Other Clinician: Referring Emmitt Matthews: Treating Kimila Papaleo/Extender: Fredirick Maudlin [name], Alyssa Weeks in  Treatment: 41 Vital Signs Height(in): 74 Pulse(bpm): 55 Weight(lbs): 151 Blood Pressure(mmHg): 124/66 Body Mass Index(BMI): 19.4 Temperature(F): 98.9 Respiratory Rate(breaths/min): 16 Photos: [3:Right, Lateral Foot] [N/A:N/A N/A] Wound Location: [3:Gradually Appeared] [N/A:N/A] Wounding Event: [3:Diabetic Wound/Ulcer of the Lower] [N/A:N/A] Primary Etiology: [3:Extremity Coronary Artery Disease,] [N/A:N/A] Comorbid History: [3:Hypertension, Type II Diabetes, Osteoarthritis, Neuropathy 06/21/2020] [N/A:N/A] Date Acquired: [3:54] [N/A:N/A] Weeks of Treatment: [3:Open] [N/A:N/A] Wound Status: [3:No] [N/A:N/A] Wound Recurrence: [3:1x1.5x0.1] [N/A:N/A] Measurements L x W x D (cm) [3:1.178] [N/A:N/A] A (cm) : rea [3:0.118] [N/A:N/A] Volume (cm) : [3:41.20%] [N/A:N/A] % Reduction in A [3:rea: 70.60%] [N/A:N/A] %  Reduction in Volume: [3:Grade 2] [N/A:N/A] Classification: [3:Medium] [N/A:N/A] Exudate A mount: [3:Serosanguineous] [N/A:N/A] Exudate Type: [3:red, brown] [N/A:N/A] Exudate Color: [3:Flat and Intact] [N/A:N/A] Wound Margin: [3:Large (67-100%)] [N/A:N/A] Granulation A mount: [3:Red, Pink] [N/A:N/A] Granulation Quality: [3:Small (1-33%)] [N/A:N/A] Necrotic A mount: [3:Fat Layer (Subcutaneous Tissue): Yes N/A] Exposed Structures: [3:Fascia: No Tendon: No Muscle: No Joint: No Bone: No Medium (34-66%)] [N/A:N/A] Epithelialization: [3:Debridement - Selective/Open Wound N/A] Debridement: Pre-procedure Verification/Time Out 07:57 [N/A:N/A] Taken: [3:Lidocaine 4% Topical Solution] [N/A:N/A] Pain Control: [3:Slough] [N/A:N/A] Tissue Debrided: [3:Skin/Epidermis] [N/A:N/A] Level: [3:1.5] [N/A:N/A] Debridement A (sq cm): [3:rea Curette] [N/A:N/A] Instrument: [3:Minimum] [N/A:N/A] Bleeding: [3:Pressure] [N/A:N/A] Hemostasis A chieved: [3:0] [N/A:N/A] Procedural Pain: [3:0] [N/A:N/A] Post Procedural Pain: [3:Procedure was tolerated well] [N/A:N/A] Debridement Treatment Response:  [3:1x1.5x0.1] [N/A:N/A] Post Debridement Measurements L x W x D (cm) [3:0.118] [N/A:N/A] Post Debridement Volume: (cm) [3:Debridement] [N/A:N/A] Treatment Notes Electronic Signature(s) Signed: 07/26/2021 8:08:23 AM By: Fredirick Maudlin MD FACS Signed: 07/26/2021 4:59:32 PM By: Adline Peals Entered By: Fredirick Maudlin on 07/26/2021 08:08:23 -------------------------------------------------------------------------------- Multi-Disciplinary Care Plan Details Patient Name: Date of Service: [name] [name] [facility] TH[facility] N L. 07/26/2021 7:45 A M Medical Record Number: [id number] Patient Account Number: [id number] Date of Birth/Sex: Treating RN: 1955-10-08 (66 y.o. [name] Primary Care Julianne Chamberlin: [name], Alyssa Other Clinician: Referring Deshan Hemmelgarn: Treating Johnika Escareno/Extender: Orland Jarred in Treatment: 39 Tovey reviewed with physician Active Inactive Wound/Skin Impairment Nursing Diagnoses: Knowledge deficit related to ulceration/compromised skin integrity Goals: Patient/caregiver will verbalize understanding of skin care regimen Date Initiated: 07/07/2020 Target Resolution Date: 09/01/2021 Goal Status: Active Interventions: Assess patient/caregiver ability to obtain necessary supplies Assess patient/caregiver ability to perform ulcer/skin care regimen upon admission and as needed Provide education on ulcer and skin care Treatment Activities: Skin care regimen initiated : 07/07/2020 Topical wound management initiated : 07/07/2020 Notes: Electronic Signature(s) Signed: 07/26/2021 4:59:32 PM By: Adline Peals Entered By: Adline Peals on 07/26/2021 07:55:00 -------------------------------------------------------------------------------- Pain Assessment Details Patient Name: Date of Service: [name] [name] [facility] TH[facility] N L. 07/26/2021 7:45 A M Medical Record Number: [id number] Patient Account Number: [id number] Date of  Birth/Sex: Treating RN: Jun 05, 1955 (66 y.o. [name] Primary Care Janet Decesare: [name], Alyssa Other Clinician: Referring Teara Duerksen: Treating Jamaul Heist/Extender: Thana Ates, Alyssa Weeks in Treatment: 52 Active Problems Location of Pain Severity and Description of Pain Patient Has Paino Yes Site Locations Rate the pain. Current Pain Level: 8 Pain Management and Medication Current Pain Management: Electronic Signature(s) Signed: 07/26/2021 4:59:32 PM By: Adline Peals Entered By: Adline Peals on 07/26/2021 07:47:56 -------------------------------------------------------------------------------- Patient/Caregiver Education Details Patient Name: Date of Service: [name] [name] [facility] THA N L. 7/26/2023andnbsp7:45 A M Medical Record Number: [id number] Patient Account Number: [id number] Date of Birth/Gender: Treating RN: [birth year] (66 y.o. [name] Primary Care Physician: [name], Alyssa Other Clinician: Referring Physician: Treating Physician/Extender: Orland Jarred in Treatment: 74 Education Assessment Education Provided To: Patient Education Topics Provided Wound/Skin Impairment: Methods: Explain/Verbal Responses: Reinforcements needed, State content correctly Electronic Signature(s) Signed: 07/26/2021 4:59:32 PM By: Adline Peals Entered By: Adline Peals on 07/26/2021 07:55:11 -------------------------------------------------------------------------------- Wound Assessment Details Patient Name: Date of Service: [name] [name] [facility] TH[facility] N L. 07/26/2021 7:45 A M Medical Record Number: [id number] Patient Account Number: [id number] Date of Birth/Sex: Treating RN: 12-08-1955 (66 y.o. [name] Primary Care Michal Callicott: [name], Alyssa Other Clinician: Referring Annalaura Sauseda: Treating Rod Majerus/Extender: Fredirick Maudlin [name], Alyssa Weeks in Treatment: 59 Wound Status Wound Number: 3  Primary Diabetic Wound/Ulcer of the Lower Extremity Etiology: Wound  Location: Right, Lateral Foot Wound Open Wounding Event: Gradually Appeared Status: Date Acquired: 06/21/2020 Comorbid Coronary Artery Disease, Hypertension, Type II Diabetes, Weeks Of Treatment: 54 History: Osteoarthritis, Neuropathy Clustered Wound: No Photos Wound Measurements Length: (cm) 1 Width: (cm) 1.5 Depth: (cm) 0.1 Area: (cm) 1.178 Volume: (cm) 0.118 % Reduction in Area: 41.2% % Reduction in Volume: 70.6% Epithelialization: Medium (34-66%) Tunneling: No Undermining: No Wound Description Classification: Grade 2 Wound Margin: Flat and Intact Exudate Amount: Medium Exudate Type: Serosanguineous Exudate Color: red, brown Foul Odor After Cleansing: No Slough/Fibrino Yes Wound Bed Granulation Amount: Large (67-100%) Exposed Structure Granulation Quality: Red, Pink Fascia Exposed: No Necrotic Amount: Small (1-33%) Fat Layer (Subcutaneous Tissue) Exposed: Yes Necrotic Quality: Adherent Slough Tendon Exposed: No Muscle Exposed: No Joint Exposed: No Bone Exposed: No Treatment Notes Wound #3 (Foot) Wound Laterality: Right, Lateral Cleanser Soap and Water Discharge Instruction: May shower and wash wound with dial antibacterial soap and water prior to dressing change. Wound Cleanser Discharge Instruction: Cleanse the wound with wound cleanser prior to applying a clean dressing using gauze sponges, not tissue or cotton balls. Peri-Wound Care Zinc Oxide Ointment 30g tube Discharge Instruction: Apply Zinc Oxide to periwound with each dressing change Topical Primary Dressing Promogran Prisma Matrix, 4.34 (sq in) (silver collagen) Discharge Instruction: Moisten collagen with saline or hydrogel Secondary Dressing ALLEVYN Gentle Border, 3x3 (in/in) Discharge Instruction: Apply over primary dressing as directed. Secured With Compression Wrap Compression Stockings Sport and exercise psychologist) Signed: 07/26/2021 2:57:53 PM By: Baruch Gouty RN, BSN Signed: 07/26/2021 4:59:32 PM By: Sabas Sous By: Baruch Gouty on 07/26/2021 07:52:24 -------------------------------------------------------------------------------- Vitals Details Patient Name: Date of Service: [name] [name] [facility] THA N L. 07/26/2021 7:45 A M Medical Record Number: [id number] Patient Account Number: [id number] Date of Birth/Sex: Treating RN: [birth year] (66 y.o. [name] Primary Care Lovell Nuttall: [name], Alyssa Other Clinician: Referring Kambra Beachem: Treating Athenia Rys/Extender: Fredirick Maudlin [name], Alyssa Weeks in Treatment: 30 Vital Signs Time Taken: 07:47 Temperature (F): 98.9 Height (in): 74 Pulse (bpm): 55 Weight (lbs): 151 Respiratory Rate (breaths/min): 16 Body Mass Index (BMI): 19.4 Blood Pressure (mmHg): 124/66 Reference Range: 80 - 120 mg / dl Electronic Signature(s) Signed: 07/26/2021 4:59:32 PM By: Adline Peals Entered By: Adline Peals on 07/26/2021 07:47:44

## 2021-07-27 NOTE — Pre-Procedure Instructions (Signed)
Surgical Instructions    Your procedure is scheduled on Thursday, August 3rd.  Report to [facility] Main Entrance "A" at 09:30 A.M., then check in with the Admitting office.  Call this number if you have problems the morning of surgery:  [phone]   If you have any questions prior to your surgery date call [phone]: Open Monday-Friday 8am-4pm    Remember:  Do not eat after midnight the night before your surgery  You may drink clear liquids until 08:30 AM the morning of your surgery.   Clear liquids allowed are: Water, Non-Citrus Juices (without pulp), Carbonated Beverages, Clear Tea, Black Coffee Only (NO MILK, CREAM OR POWDERED CREAMER of any kind), and Gatorade.    Take these medicines the morning of surgery with A SIP OF WATER  atorvastatin (LIPITOR)  PARoxetine (PAXIL)   WHAT DO I DO ABOUT MY DIABETES MEDICATION?   THE NIGHT BEFORE SURGERY, do not take bedtime dose of insulin aspart (NOVOLOG)       THE MORNING OF SURGERY, do not take insulin aspart (NOVOLOG) unless CBG is greater than 220.   If your CBG is greater than 220 mg/dL, you may take  of your sliding scale (correction) dose of insulin.   HOW TO MANAGE YOUR DIABETES BEFORE AND AFTER SURGERY  Why is it important to control my blood sugar before and after surgery? Improving blood sugar levels before and after surgery helps healing and can limit problems. A way of improving blood sugar control is eating a healthy diet by:  Eating less sugar and carbohydrates  Increasing activity/exercise  Talking with your doctor about reaching your blood sugar goals High blood sugars (greater than 180 mg/dL) can raise your risk of infections and slow your recovery, so you will need to focus on controlling your diabetes during the weeks before surgery. Make sure that the doctor who takes care of your diabetes knows about your planned surgery including the date and location.  How do I manage my blood sugar before  surgery? Check your blood sugar at least 4 times a day, starting 2 days before surgery, to make sure that the level is not too high or low.  Check your blood sugar the morning of your surgery when you wake up and every 2 hours until you get to the Short Stay unit.  If your blood sugar is less than 70 mg/dL, you will need to treat for low blood sugar: Do not take insulin. Treat a low blood sugar (less than 70 mg/dL) with  cup of clear juice (cranberry or apple), 4 glucose tablets, OR glucose gel. Recheck blood sugar in 15 minutes after treatment (to make sure it is greater than 70 mg/dL). If your blood sugar is not greater than 70 mg/dL on recheck, call [phone] for further instructions. Report your blood sugar to the short stay nurse when you get to Short Stay.  If you are admitted to the hospital after surgery: Your blood sugar will be checked by the staff and you will probably be given insulin after surgery (instead of oral diabetes medicines) to make sure you have good blood sugar levels. The goal for blood sugar control after surgery is 80-180 mg/dL.    As of today, STOP taking any Aspirin (unless otherwise instructed by your surgeon) Aleve, Naproxen, Ibuprofen, Motrin, Advil, Goody's, BC's, all herbal medications, fish oil, and all vitamins.                     Do  NOT Smoke (Tobacco/Vaping) for 24 hours prior to your procedure.  If you use a CPAP at night, you may bring your mask/headgear for your overnight stay.   Contacts, glasses, piercing's, hearing aid's, dentures or partials may not be worn into surgery, please bring cases for these belongings.    For patients admitted to the hospital, discharge time will be determined by your treatment team.   Patients discharged the day of surgery will not be allowed to drive home, and someone needs to stay with them for 24 hours.  SURGICAL WAITING ROOM VISITATION Patients having surgery or a procedure may have no more than 2 support  people in the waiting area - these visitors may rotate.   Children under the age of 72 must have an adult with them who is not the patient. If the patient needs to stay at the hospital during part of their recovery, the visitor guidelines for inpatient rooms apply. Pre-op nurse will coordinate an appropriate time for 1 support person to accompany patient in pre-op.  This support person may not rotate.   Please refer to the [facility] website for the visitor guidelines for Inpatients (after your surgery is over and you are in a regular room).    Special instructions:   [place]- Preparing For Surgery  Before surgery, you can play an important role. Because skin is not sterile, your skin needs to be as free of germs as possible. You can reduce the number of germs on your skin by washing with CHG (chlorahexidine gluconate) Soap before surgery.  CHG is an antiseptic cleaner which kills germs and bonds with the skin to continue killing germs even after washing.    Oral Hygiene is also important to reduce your risk of infection.  Remember - BRUSH YOUR TEETH THE MORNING OF SURGERY WITH YOUR REGULAR TOOTHPASTE  Please do not use if you have an allergy to CHG or antibacterial soaps. If your skin becomes reddened/irritated stop using the CHG.  Do not shave (including legs and underarms) for at least 48 hours prior to first CHG shower. It is OK to shave your face.  Please follow these instructions carefully.   Shower the NIGHT BEFORE SURGERY and the MORNING OF SURGERY  If you chose to wash your hair, wash your hair first as usual with your normal shampoo.  After you shampoo, rinse your hair and body thoroughly to remove the shampoo.  Use CHG Soap as you would any other liquid soap. You can apply CHG directly to the skin and wash gently with a scrungie or a clean washcloth.   Apply the CHG Soap to your body ONLY FROM THE NECK DOWN.  Do not use on open wounds or open sores. Avoid contact with your  eyes, ears, mouth and genitals (private parts). Wash Face and genitals (private parts)  with your normal soap.   Wash thoroughly, paying special attention to the area where your surgery will be performed.  Thoroughly rinse your body with warm water from the neck down.  DO NOT shower/wash with your normal soap after using and rinsing off the CHG Soap.  Pat yourself dry with a CLEAN TOWEL.  Wear CLEAN PAJAMAS to bed the night before surgery  Place CLEAN SHEETS on your bed the night before your surgery  DO NOT SLEEP WITH PETS.   Day of Surgery: Take a shower with CHG soap. Do not wear jewelry  Do not wear lotions, powders, colognes, or deodorant.  Men may shave face and  neck. Do not bring valuables to the hospital. [facility] is not responsible for any belongings or valuables.  Wear Clean/Comfortable clothing the morning of surgery Remember to brush your teeth WITH YOUR REGULAR TOOTHPASTE.   Please read over the following fact sheets that you were given.    If you received a COVID test during your pre-op visit  it is requested that you wear a mask when out in public, stay away from anyone that may not be feeling well and notify your surgeon if you develop symptoms. If you have been in contact with anyone that has tested positive in the last 10 days please notify you surgeon.

## 2021-07-28 ENCOUNTER — Inpatient Hospital Stay (HOSPITAL_COMMUNITY)
Admission: RE | Admit: 2021-07-28 | Discharge: 2021-07-28 | Disposition: A | Discharge status: Home / Self Care | Payer: HMO | Source: Ambulatory Visit

## 2021-08-01 DIAGNOSIS — I2510 Atherosclerotic heart disease of native coronary artery without angina pectoris: Secondary | ICD-10-CM | POA: Diagnosis not present

## 2021-08-01 DIAGNOSIS — M47812 Spondylosis without myelopathy or radiculopathy, cervical region: Secondary | ICD-10-CM | POA: Diagnosis not present

## 2021-08-01 DIAGNOSIS — D649 Anemia, unspecified: Secondary | ICD-10-CM | POA: Diagnosis not present

## 2021-08-01 DIAGNOSIS — E1151 Type 2 diabetes mellitus with diabetic peripheral angiopathy without gangrene: Secondary | ICD-10-CM | POA: Diagnosis not present

## 2021-08-01 DIAGNOSIS — Z794 Long term (current) use of insulin: Secondary | ICD-10-CM | POA: Diagnosis not present

## 2021-08-01 DIAGNOSIS — K5904 Chronic idiopathic constipation: Secondary | ICD-10-CM | POA: Diagnosis not present

## 2021-08-01 DIAGNOSIS — M720 Palmar fascial fibromatosis [Dupuytren]: Secondary | ICD-10-CM | POA: Diagnosis not present

## 2021-08-01 DIAGNOSIS — G8929 Other chronic pain: Secondary | ICD-10-CM | POA: Diagnosis not present

## 2021-08-01 DIAGNOSIS — L97511 Non-pressure chronic ulcer of other part of right foot limited to breakdown of skin: Secondary | ICD-10-CM | POA: Diagnosis not present

## 2021-08-01 DIAGNOSIS — N401 Enlarged prostate with lower urinary tract symptoms: Secondary | ICD-10-CM | POA: Diagnosis not present

## 2021-08-01 DIAGNOSIS — E785 Hyperlipidemia, unspecified: Secondary | ICD-10-CM | POA: Diagnosis not present

## 2021-08-01 DIAGNOSIS — I480 Paroxysmal atrial fibrillation: Secondary | ICD-10-CM | POA: Diagnosis not present

## 2021-08-01 DIAGNOSIS — I509 Heart failure, unspecified: Secondary | ICD-10-CM | POA: Diagnosis not present

## 2021-08-01 DIAGNOSIS — F17210 Nicotine dependence, cigarettes, uncomplicated: Secondary | ICD-10-CM | POA: Diagnosis not present

## 2021-08-01 DIAGNOSIS — E11621 Type 2 diabetes mellitus with foot ulcer: Secondary | ICD-10-CM | POA: Diagnosis not present

## 2021-08-01 DIAGNOSIS — I110 Hypertensive heart disease with heart failure: Secondary | ICD-10-CM | POA: Diagnosis not present

## 2021-08-01 DIAGNOSIS — F419 Anxiety disorder, unspecified: Secondary | ICD-10-CM | POA: Diagnosis not present

## 2021-08-01 DIAGNOSIS — E559 Vitamin D deficiency, unspecified: Secondary | ICD-10-CM | POA: Diagnosis not present

## 2021-08-01 DIAGNOSIS — Z7901 Long term (current) use of anticoagulants: Secondary | ICD-10-CM | POA: Diagnosis not present

## 2021-08-01 DIAGNOSIS — E113299 Type 2 diabetes mellitus with mild nonproliferative diabetic retinopathy without macular edema, unspecified eye: Secondary | ICD-10-CM | POA: Diagnosis not present

## 2021-08-01 DIAGNOSIS — F3341 Major depressive disorder, recurrent, in partial remission: Secondary | ICD-10-CM | POA: Diagnosis not present

## 2021-08-01 DIAGNOSIS — N138 Other obstructive and reflux uropathy: Secondary | ICD-10-CM | POA: Diagnosis not present

## 2021-08-01 DIAGNOSIS — L409 Psoriasis, unspecified: Secondary | ICD-10-CM | POA: Diagnosis not present

## 2021-08-01 DIAGNOSIS — E1142 Type 2 diabetes mellitus with diabetic polyneuropathy: Secondary | ICD-10-CM | POA: Diagnosis not present

## 2021-08-03 ENCOUNTER — Ambulatory Visit (HOSPITAL_COMMUNITY): Admission: RE | Admit: 2021-08-03 | Payer: HMO | Source: Home / Self Care | Admitting: Ophthalmology

## 2021-08-03 SURGERY — PARS PLANA VITRECTOMY WITH 25 GAUGE
Anesthesia: General | Laterality: Left

## 2021-08-04 ENCOUNTER — Encounter (INDEPENDENT_AMBULATORY_CARE_PROVIDER_SITE_OTHER): Payer: PPO | Admitting: Ophthalmology

## 2021-08-04 DIAGNOSIS — E113513 Type 2 diabetes mellitus with proliferative diabetic retinopathy with macular edema, bilateral: Secondary | ICD-10-CM

## 2021-08-04 DIAGNOSIS — H4312 Vitreous hemorrhage, left eye: Secondary | ICD-10-CM

## 2021-08-04 DIAGNOSIS — I10 Essential (primary) hypertension: Secondary | ICD-10-CM

## 2021-08-04 DIAGNOSIS — H25813 Combined forms of age-related cataract, bilateral: Secondary | ICD-10-CM

## 2021-08-04 DIAGNOSIS — H35033 Hypertensive retinopathy, bilateral: Secondary | ICD-10-CM

## 2021-08-08 ENCOUNTER — Ambulatory Visit: Payer: HMO | Admitting: Physician Assistant

## 2021-08-08 NOTE — Progress Notes (Signed)
[facility] Note  08/10/2021     CHIEF COMPLAINT Patient presents for Retina Follow Up  HISTORY OF PRESENT ILLNESS: [name] [name] is a 66 y.o. male who presents to the clinic today for:   HPI     Retina Follow Up   Patient presents with  Diabetic Retinopathy.  In both eyes.  This started 5 weeks ago.  I, the attending physician,  performed the HPI with the patient and updated documentation appropriately.        Comments   Patient here for 5 weeks retina follow up for PDR OU. Patient states vision is doing rotten. No eye pain.       Last edited by [name], MD on 08/10/2021  1:25 PM.    Pt is recovering from Covid, he states his left eye vision is getting better, he says, he has "vivid color" in it now  Referring physician: [name], [name], PA-C [address] [place],  [place] [id number]  HISTORICAL INFORMATION:   Selected notes from the MEDICAL RECORD NUMBER Referred by Dr. [name] for PDR OU   CURRENT MEDICATIONS: No current outpatient medications on file. (Ophthalmic Drugs)   No current facility-administered medications for this visit. (Ophthalmic Drugs)   Current Outpatient Medications (Other)  Medication Sig   apixaban (ELIQUIS) 5 MG TABS tablet Take 1 tablet (5 mg total) by mouth 2 (two) times daily. PLEASE SCHEDULE APPT WITH ALYSSA FOR FURTHER REFILLS [phone]   aspirin EC 81 MG EC tablet Take 1 tablet (81 mg total) by mouth daily.   atorvastatin (LIPITOR) 40 MG tablet Take 1 tablet (40 mg total) by mouth daily.   B Complex Vitamins (B COMPLEX-B12 PO) Take 1 tablet by mouth daily.    BD PEN NEEDLE NANO 2ND GEN 32G X 4 MM MISC USE FOR INSULIN   Blood Glucose Monitoring Suppl (ONETOUCH VERIO FLEX SYSTEM) w/Device KIT    Cholecalciferol (VITAMIN D) 125 MCG (5000 UT) CAPS Take 5,000 Units by mouth daily.    Continuous Blood Gluc Sensor (DEXCOM G6 SENSOR) MISC 1 Device by Does not apply route See admin  instructions. Change every 10 days   Continuous Blood Gluc Transmit (DEXCOM G6 TRANSMITTER) MISC Use as instructed to check blood sugar   furosemide (LASIX) 40 MG tablet Take 1 tablet (40 mg total) by mouth daily.   Incontinence Supply Disposable (DISPOSABLE BRIEF LARGE) MISC 1 each by Does not apply route as needed.   insulin aspart (NOVOLOG) 100 UNIT/ML injection For use in pump, total of 40 units per day   Insulin Disposable Pump (OMNIPOD 5 G6 INTRO, GEN 5,) KIT 1 Device by Does not apply route every 3 (three) days.   Insulin Disposable Pump (OMNIPOD 5 G6 POD, GEN 5,) MISC APPLY EVERY 3 (THREE) DAYS.   Lancets (ONETOUCH DELICA PLUS [id number]) MISC Apply 1 each topically 4 (four) times daily.   loperamide (IMODIUM A-D) 2 MG tablet Take 1 tablet (2 mg total) by mouth 4 (four) times daily as needed for diarrhea or loose stools.   losartan (COZAAR) 25 MG tablet Take 1 tablet (25 mg total) by mouth daily.   mirtazapine (REMERON) 15 MG tablet Take 1 tablet (15 mg total) by mouth at bedtime.   ONETOUCH VERIO test strip 1 each 4 (four) times daily.   PARoxetine (PAXIL) 20 MG tablet Take 1 tablet (20 mg total) by mouth daily.   spironolactone (ALDACTONE) 25 MG tablet TAKE ONE TABLET BY MOUTH  DAILY   No current facility-administered medications for this visit. (Other)   REVIEW OF SYSTEMS: ROS   Positive for: Neurological, Skin, Musculoskeletal, Endocrine, Cardiovascular, Eyes Negative for: Constitutional, Gastrointestinal, Genitourinary, HENT, Respiratory, Psychiatric, Allergic/Imm, Heme/Lymph Last edited by Theodore Demark, COA on 08/10/2021  9:01 AM.     ALLERGIES No Known Allergies  PAST MEDICAL HISTORY Past Medical History:  Diagnosis Date   Anxiety    Arthritis    Cataract    CHF (congestive heart failure) ([place])    Constipation    pt states goes EOD- hard stools    Coronary artery disease    Depression    Diabetes mellitus without complication (HCC)    Diabetic retinopathy ([place])     DKA (diabetic ketoacidoses)    Hyperlipidemia    Hypertension    off meds   Hypertensive retinopathy    Psoriasis    Tubular adenoma of colon 03/2019   Past Surgical History:  Procedure Laterality Date   ABDOMINAL AORTOGRAM W/LOWER EXTREMITY N/A 08/30/2020   Procedure: ABDOMINAL AORTOGRAM W/LOWER EXTREMITY;  Surgeon: Serafina Mitchell, MD;  Location: [facility];  Service: Cardiovascular;  Laterality: N/A;   ABDOMINAL AORTOGRAM W/LOWER EXTREMITY N/A 11/15/2020   Procedure: ABDOMINAL AORTOGRAM W/LOWER EXTREMITY;  Surgeon: Serafina Mitchell, MD;  Location: [facility];  Service: Cardiovascular;  Laterality: N/A;   ANKLE SURGERY     right   CARDIAC CATHETERIZATION  09/25/2001   COLONOSCOPY     ~10 yr ago- normal  per pt    CORONARY ARTERY BYPASS GRAFT  09/30/2001   CABG x 4   IR THORACENTESIS ASP PLEURAL SPACE W/IMG GUIDE  05/19/2020   LAPAROSCOPIC INGUINAL HERNIA REPAIR Bilateral 02/09/2010   MASS EXCISION Left 03/12/2013   Procedure: LEFT INDEX EXCISION MASS AND DEBRIDMENT DISTAL INTERPHALANGEAL JOINT;  Surgeon: Tennis Must, MD;  Location: [facility];  Service: Orthopedics;  Laterality: Left;   PERIPHERAL VASCULAR BALLOON ANGIOPLASTY Left 08/30/2020   Procedure: PERIPHERAL VASCULAR BALLOON ANGIOPLASTY;  Surgeon: Serafina Mitchell, MD;  Location: [facility];  Service: Cardiovascular;  Laterality: Left;  Peroneal   FAMILY HISTORY Family History  Problem Relation Age of Onset   Lymphoma Father    Cancer Paternal Grandmother        Colon Cancer   Colon cancer Paternal Grandmother    Colon polyps Neg Hx    Esophageal cancer Neg Hx    Rectal cancer Neg Hx    Stomach cancer Neg Hx    Diabetes Mellitus I Neg Hx    Pancreatic cancer Neg Hx    SOCIAL HISTORY Social History   Tobacco Use   Smoking status: Some Days    Types: Cigars   Smokeless tobacco: Never  Vaping Use   Vaping Use: Never used  Substance Use Topics   Alcohol use: Yes     Comment: daily beer or scotch    Drug use: No       OPHTHALMIC EXAM: Base Eye Exam     Visual Acuity (Snellen - Linear)       Right Left   Dist [place] 20/70 +2 CF at 3'   Dist ph [place] 20/40 NI         Tonometry (Tonopen, 8:58 AM)       Right Left   Pressure 11 07         Pupils       Dark Light Shape React APD   Right 4  3 Round Minimal None   Left 4 3 Round Minimal None         Visual Fields (Counting fingers)       Left Right     Full   Restrictions Partial outer superior nasal, inferior nasal deficiencies          Extraocular Movement       Right Left    Full, Ortho Full, Ortho         Neuro/Psych     Oriented x3: Yes   Mood/Affect: Normal         Dilation     Both eyes: 1.0% Mydriacyl, 2.5% Phenylephrine @ 8:58 AM           Slit Lamp and Fundus Exam     External Exam       Right Left   External Normal Normal         Slit Lamp Exam       Right Left   Lids/Lashes Dermatochalasis - upper lid, Meibomian gland dysfunction Dermatochalasis - upper lid, Meibomian gland dysfunction   Conjunctiva/Sclera White and quiet White and quiet   Cornea arcus, 2-3+ Punctate epithelial erosions arcus, 2+ Punctate epithelial erosions   Anterior Chamber Deep and quiet Deep and quiet   Iris Round and dilated, No NVI Round and dilated, No NVI   Lens 2-3+ Nuclear sclerosis with brunescence, 2+ Cortical cataract 2-3+ Nuclear sclerosis with brunescence, 2-3+ Cortical cataract   Anterior Vitreous Vitreous syneresis, blood stained vitreous condensations persistent Vitreous syneresis, diffuse VH - improved, blood stained vitreous condensations, pre-retinal heme collecting inferiorly and now white -- all improving         Fundus Exam       Right Left   Disc Pink and Sharp, NVD - regressed hazy view, Compact, fibrosis, mild Pallor   C/D Ratio 0.2 not visible   Macula Flat, good foveal reflex, scatterd MA, +cystic changes/edema temporal fovea and macula --  stably improved hazy view w/ residual VH and fibrosis, +traction   Vessels +NVE just inside ST arcades - improved, +fibrosis, attenuated, Tortuous Severe attenuation, Tortuous, +NVD; NVE along ST arcades - regressing   Periphery Attached; scattered DBH, good 360 PRP with room for fill in Hazy view, Grossly attached scattered PRP visible           Refraction     Wearing Rx       Sphere Cylinder Axis Add   Right +0.25 +2.00 180 +2.50   Left +0.50 +1.75 009 +2.50           IMAGING AND PROCEDURES  Imaging and Procedures for 08/10/2021  OCT, Retina - OU - Both Eyes       Right Eye Quality was borderline. Central Foveal Thickness: 241. Progression has been stable. Findings include no SRF, abnormal foveal contour, intraretinal hyper-reflective material, intraretinal fluid, vitreous traction, preretinal fibrosis (Partial PVD, persistent cystic changes temporal macula and fovea, persistent PRF with traction).   Left Eye Quality was poor. Central Foveal Thickness: 202. Progression has improved. Findings include no SRF, abnormal foveal contour, intraretinal hyper-reflective material, intraretinal fluid, vitreous traction (Interval improvement in severe vitreous opacities, persistent vitreous traction greatest along proximal arcades and disc).   Notes *Images captured and stored on drive  Diagnosis / Impression:  PDR with DME OU OD: Partial PVD, Partial PVD, persistent cystic changes temporal macula and fovea, persistent PRF with traction OS: Interval improvement in vitreous opacities, persistent vitreous traction greatest along proximal arcades and disc  Clinical management:  See below  Abbreviations: NFP - Normal foveal profile. CME - cystoid macular edema. PED - pigment epithelial detachment. IRF - intraretinal fluid. SRF - subretinal fluid. EZ - ellipsoid zone. ERM - epiretinal membrane. ORA - outer retinal atrophy. ORT - outer retinal tubulation. SRHM - subretinal  hyper-reflective material. IRHM - intraretinal hyper-reflective material      Intravitreal Injection, Pharmacologic Agent - OD - Right Eye       Time Out 08/10/2021. 10:04 AM. Confirmed correct patient, procedure, site, and patient consented.   Anesthesia Topical anesthesia was used. Anesthetic medications included Lidocaine 2%, Proparacaine 0.5%.   Procedure Preparation included 5% betadine to ocular surface, eyelid speculum. A supplied (32g) needle was used.   Injection: 1.25 mg Bevacizumab 1.25mg /0.33ml   Route: Intravitreal, Site: Right Eye   NDC: H061816, Lot: [id number] , Expiration date: 10/03/2021   Post-op Post injection exam found visual acuity of at least counting fingers. The patient tolerated the procedure well. There were no complications. The patient received written and verbal post procedure care education. Post injection medications were not given.      Intravitreal Injection, Pharmacologic Agent - OS - Left Eye       Time Out 08/10/2021. 10:05 AM. Confirmed correct patient, procedure, site, and patient consented.   Anesthesia Topical anesthesia was used. Anesthetic medications included Lidocaine 2%, Proparacaine 0.5%.   Procedure Preparation included 5% betadine to ocular surface, eyelid speculum. A (32 g) needle was used.   Injection: 1.25 mg Bevacizumab 1.25mg /0.34ml   Route: Intravitreal, Site: Left Eye   NDC: 80m, [id number], Expiration date: 09/04/2021   Post-op Post injection exam found visual acuity of at least counting fingers. The patient tolerated the procedure well. There were no complications. The patient received written and verbal post procedure care education. Post injection medications were not given.            ASSESSMENT/PLAN:    ICD-10-CM   1. Proliferative diabetic retinopathy of both eyes with macular edema associated with type 2 diabetes mellitus (HCC)  E11.3513 OCT, Retina - OU - Both Eyes    Intravitreal  Injection, Pharmacologic Agent - OD - Right Eye    Intravitreal Injection, Pharmacologic Agent - OS - Left Eye    Bevacizumab (AVASTIN) SOLN 1.25 mg    Bevacizumab (AVASTIN) SOLN 1.25 mg    2. Vitreous hemorrhage of left eye (HCC)  H43.12     3. Essential hypertension  I10     4. Hypertensive retinopathy of both eyes  H35.033     5. Combined forms of age-related cataract of both eyes  H25.813      1. Severe proliferative diabetic retinopathy, both eyes  - last A1c 9.2 on 4.20.23; A1c >15% on 10.11.22 - s/p PRP OS (11.10.22)  - s/p PRP OD (12.02.22)  - s/p IVA OS #1 (01.18.23) for VH, #2 (03.17.23), #3 (04.14.23), #4 (05.12.23), #5 (06.09.23), #6 (07.07.23)  - s/p IVA OD #1 (01.27.23) for macular edema/NVD, #2 (03.17.23), #3 (04.14.23), #4 (05.12.23), #5 (06.09.23), #6 (07.07.23) - pt reports improved BG control - exam shows extensive neovascularization, fibrosis and hemorrhage OU (OS >>> OD)  - OD with improved NVD / VH - OS with improved VH - BCVA 20/40 OD - stable, CF 3' OS - FA (11.09.22) shows areas of vascular non-perfusion OU, +NVD OU and +NVE OU - OCT shows OD: Partial PVD, Partial PVD, persistent cystic changes temporal macula and fovea, persistent PRF with traction; OS: Interval improvement in vitreous opacities,  persistent vitreous traction greatest along proximal arcades and disc - discussed findings, severity of his disease, prognosis and likely need for extensive treatments -- PRP OU, multiple rounds of anti-VEGF therapy OU and possible surgery - recommend IVA OU #7 today, 08.10.23 for PDR OU - pt wishes to proceed with injections - RBA of procedure discussed, questions answered - informed consent obtained and signed, 01.18.23 - see procedure note - F/u 4 weeks, DFE, OCT  2. Vitreous hemorrhage OS  - acute increase in VH OS (late Dec 2022) per pt report  - s/p IVA OS as above  - VH remains diffuse and now with increasing vitreous traction  - recommend PPV w/  membrane peel OS under general anesthesia - pt wishes to f/u in 4 weeks and discuss surgery again at that point  3,4. Hypertensive retinopathy OU - discussed importance of tight BP control - monitor   5. Mixed Cataract OU - The symptoms of cataract, surgical options, and treatments and risks were discussed with patient - discussed diagnosis and progression - monitor   Ophthalmic Meds Ordered this visit:  Meds ordered this encounter  Medications   Bevacizumab (AVASTIN) SOLN 1.25 mg   Bevacizumab (AVASTIN) SOLN 1.25 mg     Return in about 4 weeks (around 09/07/2021) for f/u PDR OU, DFE, OCT.  There are no Patient Instructions on file for this visit.  Explained the diagnoses, plan, and follow up with the patient and they expressed understanding.  Patient expressed understanding of the importance of proper follow up care.   This document serves as a record of services personally performed by [name], MD, PhD. It was created on their behalf by San Jetty. Owens Shark, OA an ophthalmic technician. The creation of this record is the provider's dictation and/or activities during the visit.    Electronically signed by: San Jetty. Owens Shark, [place] 08.08.2023 1:55 PM   [name], M.D., Ph.D. Diseases & Surgery of the Retina and Vitreous [facility]  I have reviewed the above documentation for accuracy and completeness, and I agree with the above. [name], M.D., Ph.D. 08/10/21 1:55 PM  Abbreviations: M myopia (nearsighted); A astigmatism; H hyperopia (farsighted); P presbyopia; Mrx spectacle prescription;  CTL contact lenses; OD right eye; OS left eye; OU both eyes  XT exotropia; ET esotropia; PEK punctate epithelial keratitis; PEE punctate epithelial erosions; DES dry eye syndrome; MGD meibomian gland dysfunction; ATs artificial tears; PFAT's preservative free artificial tears; [place] nuclear sclerotic cataract; PSC posterior subcapsular cataract; ERM epi-retinal  membrane; PVD posterior vitreous detachment; RD retinal detachment; DM diabetes mellitus; DR diabetic retinopathy; NPDR non-proliferative diabetic retinopathy; PDR proliferative diabetic retinopathy; CSME clinically significant macular edema; DME diabetic macular edema; dbh dot blot hemorrhages; CWS cotton wool spot; POAG primary open angle glaucoma; C/D cup-to-disc ratio; HVF humphrey visual field; GVF goldmann visual field; OCT optical coherence tomography; IOP intraocular pressure; BRVO Branch retinal vein occlusion; CRVO central retinal vein occlusion; CRAO central retinal artery occlusion; BRAO branch retinal artery occlusion; RT retinal tear; SB scleral buckle; PPV pars plana vitrectomy; VH Vitreous hemorrhage; PRP panretinal laser photocoagulation; IVK intravitreal kenalog; VMT vitreomacular traction; MH Macular hole;  NVD neovascularization of the disc; NVE neovascularization elsewhere; AREDS age related eye disease study; ARMD age related macular degeneration; POAG primary open angle glaucoma; EBMD epithelial/anterior basement membrane dystrophy; ACIOL anterior chamber intraocular lens; IOL intraocular lens; PCIOL posterior chamber intraocular lens; Phaco/IOL phacoemulsification with intraocular lens placement; PRK photorefractive keratectomy; LASIK laser assisted  in situ keratomileusis; HTN hypertension; DM diabetes mellitus; COPD chronic obstructive pulmonary disease

## 2021-08-09 ENCOUNTER — Encounter (HOSPITAL_BASED_OUTPATIENT_CLINIC_OR_DEPARTMENT_OTHER): Payer: HMO | Attending: General Surgery | Admitting: General Surgery

## 2021-08-09 DIAGNOSIS — E785 Hyperlipidemia, unspecified: Secondary | ICD-10-CM | POA: Insufficient documentation

## 2021-08-09 DIAGNOSIS — E1151 Type 2 diabetes mellitus with diabetic peripheral angiopathy without gangrene: Secondary | ICD-10-CM | POA: Insufficient documentation

## 2021-08-09 DIAGNOSIS — I2510 Atherosclerotic heart disease of native coronary artery without angina pectoris: Secondary | ICD-10-CM | POA: Insufficient documentation

## 2021-08-09 DIAGNOSIS — I10 Essential (primary) hypertension: Secondary | ICD-10-CM | POA: Diagnosis not present

## 2021-08-09 DIAGNOSIS — E11621 Type 2 diabetes mellitus with foot ulcer: Secondary | ICD-10-CM | POA: Insufficient documentation

## 2021-08-09 DIAGNOSIS — Z951 Presence of aortocoronary bypass graft: Secondary | ICD-10-CM | POA: Diagnosis not present

## 2021-08-09 DIAGNOSIS — F17290 Nicotine dependence, other tobacco product, uncomplicated: Secondary | ICD-10-CM | POA: Insufficient documentation

## 2021-08-09 DIAGNOSIS — L97511 Non-pressure chronic ulcer of other part of right foot limited to breakdown of skin: Secondary | ICD-10-CM | POA: Diagnosis not present

## 2021-08-09 DIAGNOSIS — Z7901 Long term (current) use of anticoagulants: Secondary | ICD-10-CM | POA: Insufficient documentation

## 2021-08-09 DIAGNOSIS — E1142 Type 2 diabetes mellitus with diabetic polyneuropathy: Secondary | ICD-10-CM | POA: Insufficient documentation

## 2021-08-09 DIAGNOSIS — Z8679 Personal history of other diseases of the circulatory system: Secondary | ICD-10-CM | POA: Diagnosis not present

## 2021-08-09 DIAGNOSIS — L97512 Non-pressure chronic ulcer of other part of right foot with fat layer exposed: Secondary | ICD-10-CM | POA: Diagnosis not present

## 2021-08-09 NOTE — Progress Notes (Signed)
[name], [name] ([id number]) Visit Report for 08/09/2021 Chief Complaint Document Details Patient Name: Date of Service: [name] THA N L. 08/09/2021 7:45 A M Medical Record Number: [id number] Patient Account Number: [id number] Date of Birth/Sex: Treating RN: [birth year] (66 y.o. M) Primary Care Provider: [name], Alyssa Other Clinician: Referring Provider: Treating Provider/Extender: Orland Jarred in Treatment: 14 Information Obtained from: Patient Chief Complaint 07/07/2020; patient is in clinic for today for review of pressure ulcers on his coccyx and left heel and diabetic foot ulcers bilaterally. Electronic Signature(s) Signed: 08/09/2021 8:02:57 AM By: Fredirick Maudlin MD FACS Entered By: Fredirick Maudlin on 08/09/2021 08:02:57 -------------------------------------------------------------------------------- Debridement Details Patient Name: Date of Service: [name] THA N L. 08/09/2021 7:45 A M Medical Record Number: [id number] Patient Account Number: [id number] Date of Birth/Sex: Treating RN: [birth year] (66 y.o. [name] Primary Care Provider: [name], Alyssa Other Clinician: Referring Provider: Treating Provider/Extender: Orland Jarred in Treatment: 56 Debridement Performed for Assessment: Wound #3 Right,Lateral Foot Performed By: Physician Fredirick Maudlin, MD Debridement Type: Debridement Severity of Tissue Pre Debridement: Fat layer exposed Level of Consciousness (Pre-procedure): Awake and Alert Pre-procedure Verification/Time Out Yes - 07:56 Taken: Start Time: 07:56 Pain Control: Lidocaine 4% Topical Solution T Area Debrided (L x W): otal 1 (cm) x 1 (cm) = 1 (cm) Tissue and other material debrided: Non-Viable, Callus, Skin: Epidermis Level: Skin/Epidermis Debridement Description: Selective/Open Wound Instrument: Curette Bleeding: Minimum Hemostasis Achieved: Pressure Procedural Pain:  0 Post Procedural Pain: 0 Response to Treatment: Procedure was tolerated well Level of Consciousness (Post- Awake and Alert procedure): Post Debridement Measurements of Total Wound Length: (cm) 0.6 Width: (cm) 0.5 Depth: (cm) 0.1 Volume: (cm) 0.024 Character of Wound/Ulcer Post Debridement: Improved Severity of Tissue Post Debridement: Fat layer exposed Post Procedure Diagnosis Same as Pre-procedure Electronic Signature(s) Signed: 08/09/2021 12:21:54 PM By: Fredirick Maudlin MD FACS Signed: 08/09/2021 5:04:31 PM By: Adline Peals Entered By: Adline Peals on 08/09/2021 07:57:47 -------------------------------------------------------------------------------- HPI Details Patient Name: Date of Service: [name] THA N L. 08/09/2021 7:45 A M Medical Record Number: [id number] Patient Account Number: [id number] Date of Birth/Sex: Treating RN: [birth year] (66 y.o. M) Primary Care Provider: [name], Alyssa Other Clinician: Referring Provider: Treating Provider/Extender: Thana Ates, Alyssa Weeks in Treatment: 61 History of Present Illness HPI Description: ADMISSION 07/07/2020 This is a 66 year old man who is a type II diabetic. I have reviewed this in epic. It appeared that his diabetes was diagnosed in 2009 at which time he was on metformin. He was followed for a period of time by an endocrinologist with [facility] to always labeled him as a type II diabetic. Recently in epic there is been a switch to type 1 diabetes. If there were defining antibody test done I do not see them and I think the totality of the evidence would suggest that he is a type II diabetic. His recent problem started in May when he fell down 17 stairs. He suffered a scapular fracture he was hospitalized at [facility] for 9 days and then discharged to [facility]. His mother who is present said that most of these wounds seem to develop surrounding the discharge from hospital or the stay at [facility].  He has an area on his lower sacrum which appears currently to be a stage II but per their description this is actually improved quite a bit. He has an area on the left lateral foot at roughly the base of the fifth metatarsal, the  left Achilles heel and the right fifth metatarsal head laterally they have been using topical antibiotics. Seen by primary care yesterday and placed on Keflex The patient had arterial studies on 05/05/2020. I wondered whether this had something to do with wounds on his feet at that time but I have not been able to determine this. At that time his ABI on the right was 1.09 with a TBI of 0.65 on the left 0.88 with a TBI of 0.44. Waveforms were triphasic on the right monophasic on the left. He likely has significant PAD on the left Past medical history includes type 2 diabetes poorly controlled last hemoglobin A1c at 12.7 in May, hypertension, hyperlipidemia, atrial fib on Eliquis, coronary artery disease status post CABG, left scapular fracture in May of this year, AAA repair. Bilateral leg weakness ABIs were not done in our clinic as they were just done in May of this year at [facility] 7/14; patient arrives back in clinic he has areas on the left Achilles heel, base of the left fifth metatarsal, right lateral fifth metatarsal. The area he had over the coccyx appears to be healed but this area remains vulnerable. We have been using Iodoflex. I rereviewed his ABIs which were done in May. I do not know exactly what prompted these test however they do show a TBI of 0.44 on the left and monophasic waveforms. 7/21; the patient's wounds are about the same. Certainly no healing and the nurses noted some green drainage on the right fifth metatarsal head, left Achilles a little larger. We have been using silver alginate really without any improvement. We did get an appointment with vascular surgery however its that it did the end of August and they seem to want to repeat the  noninvasive test that they have already done in May. I am not sure what the issue here is. We advised the patient's sister to call and point to the idea that they have already had noninvasive studies. The real question I have is does this patient need an angiogram. He is a diabetic and it is possible that the ABIs are falsely elevated because of medial calcification. At least on the left his TBI was abnormal 08/08/2020 upon evaluation today patient appears to be doing about the same in regard to his wounds. He still waiting the appointment with vascular. This is something that hopefully should be undertaken shortly. The 29th of this month is when he is actually scheduled although they have put in a call to move this up if at all possible. Fortunately there does not appear to be any signs of active infection at this time. No fevers, chills, nausea, vomiting, or diarrhea. 8/18; appointment with Dr. Trula Slade on 8/29. The patient has wounds on the left Achilles heel, left lateral foot and the right lateral fifth metatarsal head. We have been using Santyl under Hydrofera Blue. The surface of especially the left heel looks better to me than what I remember. 9/1; since the patient was last here he underwent angiography by Dr. Trula Slade he also underwent noninvasive studies first showing a ABI of of 0.64 with monophasic waveforms. His TBI was 0.35 with a left great toe pressure of 46 this is about the same pattern as we saw previously. The left was a lot worse than the right. He underwent an angiogram on 08/29/2020. He had multiple 80% peroneal artery stenosis which was his dominant vessel which was treated with angioplasty with no residual stenosis. His right posterior tibial artery  was occluded. The anterior tibial artery was also occluded. An attempt was made at recanalization of the anterior tibial artery however this was unsuccessful. Noted that the peroneal artery reconstitutes the posterior tibial artery and  the anterior tibial artery creating the plantar arch. The patient has areas on the Achilles left heel left lateral foot and right lateral fifth metatarsal head we have been using Santyl and Hydrofera Blue 9/22; since the patient was last here he went for repeat vascular studies. On the right his ABI was 0.94 with biphasic waveforms TBI of 0.73 with a great toe pressure of 75. On the left which is the problematic wound his ABI was 1.08 but with biphasic waveforms at the PTA. Biphasic waveforms at the dorsalis pedis his TBI was 0.63 with a great toe pressure of 65. On the left however this does represent a fairly marked improvement. We have been using Santyl and Hydrofera Blue 9/29 left Achilles area. Still is having necrotic surface. Bleeds very easily as the patient is on a combination of Eliquis aspirin and Plavix just stopped yesterday. I am not going to do any debridement until next week. We are using Santyl and Hydrofera Blue. His arterial status on the right was actually quite good. The area on the fifth metatarsal head needs additional debridement. A total contact cast in this area is not out of the question. He does describe claudication I believe with minimal activity. He is not very active X-ray we ordered last week is still pending in terms of the report 10/6 left Achilles area. In terms of the necrotic surface this is quite a bit better. There is still some debris in the posterior part of the wound however this looks better. Anteriorly the wound appears to be epithelialized there is still a divot in the middle of this but this does not probe to deep tissue. The area on the right fifth metatarsal head laterally really is not making as much progress as I would have liked. Still not completely a viable surface. Both the patient and his wife agree that he is not an external rotator in terms of ambulation [an outie]. We have been using Santyl and Hydrofera Blue for a prolonged period. On the  left Achilles which looked like it is worse wound this seems to be making some progress not so much on the right foot. The patient and his wife state that they are rigorous about offloading both of these areas He was treated with angioplasty as I believe on the right peroneal artery that had multiple stenosis. This is a dominant vessel the anterior tibial and posterior tibial arteries are occluded however the foot is fed via collaterals from the peroneal. From my notes I am not able to understand the vascular supply on the left I will have to review this. 10/13; left Achilles top part of this looks healthy granulation in the bottom part of this wound again completely necrotic requiring debridement. The area on the right lateral fifth met head also requires debridement. The majority of this is epithelialized however he she there is a refractory part to this. 10/20; left Achilles 100% better looking wound surface. Necrotic upper 50% seems to have resolved at least by inspection under illumination. Culture I did of this area last week postdebridement showed Proteus and Citrobacter. Both sensitive to quinolones and I put him on ciprofloxacin. We have been using silver alginate on the wound on the heel and Santyl and Hydrofera Blue on the right fifth met head.  10/27; left Achilles is measuring much smaller we are using topical gentamicin under silver alginate. Right fifth met head still fibrinous debris on the surface we are using Santyl and Hydrofera Blue. Patient lost his brother suddenly last week he was unable to keep his vascular surgery appointment. 11/3; left Achilles not as viable surface as I might like. Some hypergranulation. Right fifth metatarsal head still completely nonviable surface. It turns out that she was not using topical gentamicin on the left heel [mother] they have been using Santyl and Hydrofera Blue on the right fifth metatarsal head. The patient did have noninvasive vascular  studies on 10/ 28/22. He had triphasic waveforms up to the level of the proximal PTA and then a 50 to 74% stenosis in the mid PTA. Peroneal artery was triphasic as was the anterior tibial. He follows up with Dr. Trula Slade on 11/7. 11/17; since the patient was last here he saw Dr. Trula Slade and went for ANGIOGRAPHY on 11/15/2020. He has wounds on the left Achilles heel and the right lateral fifth metatarsal head. He has known PAD and had previous revascularization in August. On this occasion on the right his right common femoral, profundofemoral and superficial femoral and popliteal arteries were widely patent the dominant vessel is the posterior tibial which had several areas of stenosis greater than 80% he went underwent angioplasty in this area. He had no interventions on the left however he is left common femoral, profundofemoral and superficial femoral arteries were widely patent as well as the popliteal he has recurrent stenosis greater than 50% within the peroneal artery which is a single- vessel runoff. The thought was that he may require further angiography and reangioplasty on the left peroneal artery. Overall the patient has severe PAD he has undergone angioplasty of the right posterior tibial artery. He arrives in clinic today with the Achilles heel area on the left surprisingly better with marked improvement in the surface area on the right not nearly as good but stable 12/8; patient has follow-up arterial Doppler studies on 12/[facility] at the [facility]. He is now being revascularized on both sides. Fortunately on the right Achilles area this is just about closed slight scabbing/eschar. I did not remove this today. The area on the right fifth met head looked better after last week's debridement we have been using Santyl and Hydrofera Blue 01/05/21 the patient has not been here in almost a month. He was supposed to have follow-up Doppler studies on 12/19 look these up. He has been revascularized  on both sides The left Achilles area is closed he does not need to dress this but will need to protect this area on an ongoing basis likely indefinitely. He still has the area on the right fifth plantar metatarsal head. The area is measuring smaller he has a rim of epithelialization. The granulation tissue still a bit gelatinous for my liking 1/26; The left Achilles remains closed. Plantar right fifth metatarsal head is smaller and appears to be healthy. He has not followed up with vascular surgery and does not seem particularly interested. He did not get his follow-up arterial Dopplers. I explained to him that angioplasty of the small arteries in his leg is a limb saving procedure but possibility of restenosis is reasonably high and he should follow-up with follow-up noninvasive studies. Frankly he did not seem interested today 03/30/2021: The patient has not been seen here since the end of January. He never followed up with vascular surgery and today states that he refuses  to be seen at [facility] due to his dissatisfaction with the fact that his arteries seem to restenose. The wound on his left Achilles is still closed. The plantar right fifth metatarsal head is quite a bit bigger than it was at his last visit. He is accompanied by his mother who reports that it had gotten down to a very small size but then she went out of town for a week and during that time it enlarged to its current dimensions. The patient is wearing the functional equivalent of house slippers for shoes and is not making any effort to offload. He refuses to wear a forefoot offloading sandal due to instability on his feet and says that when a total contact cast was attempted, it was "a total disaster" and he is unwilling to consider this again. He has been in PolyMem silver since at least the beginning of January. 04/26/2021: The patient is here today after about a month. He has been using Hydrofera Blue to his wound and today it is  markedly improved. It is small and clean. He is scheduled to see Dr. Gwenlyn Found in interventional cardiology tomorrow. 05/24/2021: Once again, the patient returns after about a month. I had referred him to Dr. Gwenlyn Found and the patient walked out from that appointment. Apparently there was some confusion over why he was seeing the cardiologist for his peripheral arterial disease. He saw his primary cardiologist, Dr. Davina Poke, last week and has been re-referred to see Dr. Gwenlyn Found, the appointment is tomorrow. Basically the wound is a little bit bigger today but clean without any slough accumulation. The periwound callus that has accumulated is completely macerated. I thought he had been in [facility], but the dressing that had been removed and discarded today looks more like PolyMem Ag. 05/31/2021: The patient decided not to see Dr. Gwenlyn Found because he feels like anytime he has a vascular intervention, the arteries just closed up again and he does not want to do this anymore. He does not really grasp the nature of the disease process. The wound as a bit smaller today and very clean. No accumulation of periwound callus. We have been using Hydrofera Blue. 06/07/2021: The wound looks good today. It is a little bit smaller without any significant periwound callus accumulation. The surface is clean with good granulation tissue. 06/20/2021: The patient's mother was out of town last week and so he could not come to clinic. He did have help with his wound care during her absence. Today, the wound looks very good. It is a little bit smaller. There is some slough accumulation on the surface. 06/27/2021: The wound is about the same size today, but the surface is fairly clean. There is some dead skin around the perimeter, but no real callus accumulation. Thin layer of slough. 07/11/2021: The wound is now flush with the surrounding skin. It has contracted considerably. There is just a light layer of slough and biofilm on the  surface. 07/26/2021: The patient was absent from clinic last week due to illness. Today, the skin around the wound appears to have gotten macerated and the wound is larger, as a result. It remains flush with the surrounding skin and has a nice beefy surface. 08/09/2021: The wound is quite a bit smaller today but does have some undermining and a light layer of callus. The intake nurse noted some purulent material coming from the wound when she remove the dressing, but did not find any additional pockets to express. On my evaluation, the surface  is clean and the wound does not appear to be grossly infected. Electronic Signature(s) Signed: 08/09/2021 8:03:51 AM By: Fredirick Maudlin MD FACS Entered By: Fredirick Maudlin on 08/09/2021 08:03:51 -------------------------------------------------------------------------------- Physical Exam Details Patient Name: Date of Service: [name] THA N L. 08/09/2021 7:45 A M Medical Record Number: [id number] Patient Account Number: [id number] Date of Birth/Sex: Treating RN: 1955-06-13 (66 y.o. M) Primary Care Provider: [name], Alyssa Other Clinician: Referring Provider: Treating Provider/Extender: Fredirick Maudlin [name], Alyssa Weeks in Treatment: 55 Constitutional . Bradycardic, asymptomatic.. . . No acute distress.Marland Kitchen Respiratory Normal work of breathing on room air.. Notes 08/09/2021: The wound is quite a bit smaller today but does have some undermining and a light layer of callus. The intake nurse noted some purulent material coming from the wound when she remove the dressing, but did not find any additional pockets to express. On my evaluation, the surface is clean and the wound does not appear to be grossly infected. Electronic Signature(s) Signed: 08/09/2021 8:04:34 AM By: Fredirick Maudlin MD FACS Entered By: Fredirick Maudlin on 08/09/2021 08:04:34 -------------------------------------------------------------------------------- Physician Orders  Details Patient Name: Date of Service: [name] THA N L. 08/09/2021 7:45 A M Medical Record Number: [id number] Patient Account Number: [id number] Date of Birth/Sex: Treating RN: [birth year] (66 y.o. [name] Primary Care Provider: [name], Alyssa Other Clinician: Referring Provider: Treating Provider/Extender: Orland Jarred in Treatment: 65 Verbal / Phone Orders: No Diagnosis Coding ICD-10 Coding Code Description L97.511 Non-pressure chronic ulcer of other part of right foot limited to breakdown of skin E11.621 Type 2 diabetes mellitus with foot ulcer E11.51 Type 2 diabetes mellitus with diabetic peripheral angiopathy without gangrene E11.42 Type 2 diabetes mellitus with diabetic polyneuropathy Follow-up Appointments ppointment in 1 week. - Dr. Celine Ahr - Room 2 - 8/16 at 7:45 AM Return A Bathing/ Shower/ Hygiene May shower and wash wound with soap and water. - with dressing changes only. Edema Control - Lymphedema / SCD / Other Elevate legs to the level of the heart or above for 30 minutes daily and/or when sitting, a frequency of: - 3-4 times a day throughout the day. Moisturize legs daily. - every night before bed. *** closely monitor feet every night before bed. Off-Loading Other: - float heels while resting in bed or chair. Use a pillow or rolled sheet under legs to offload the heels. Ensure no pressure to right lateral foot. Home Health No change in wound care orders this week; continue Home Health for wound care. May utilize formulary equivalent dressing for wound treatment orders unless otherwise specified. Other Home Health Orders/Instructions: - [facility] home health weekly changes and all other days family to change. Wound Treatment Wound #3 - Foot Wound Laterality: Right, Lateral Cleanser: Soap and Water Every Other Day/15 Days Discharge Instructions: May shower and wash wound with dial antibacterial soap and water prior to  dressing change. Cleanser: Wound Cleanser Every Other Day/15 Days Discharge Instructions: Cleanse the wound with wound cleanser prior to applying a clean dressing using gauze sponges, not tissue or cotton balls. Peri-Wound Care: Zinc Oxide Ointment 30g tube Every Other Day/15 Days Discharge Instructions: Apply Zinc Oxide to periwound with each dressing change Prim Dressing: Promogran Prisma Matrix, 4.34 (sq in) (silver collagen) (Generic) Every Other Day/15 Days ary Discharge Instructions: Moisten collagen with saline or hydrogel Secondary Dressing: ALLEVYN Gentle Border, 3x3 (in/in) Every Other Day/15 Days Discharge Instructions: Apply over primary dressing as directed. Patient Medications llergies: No Known Allergies A Notifications Medication Indication Start  End 08/09/2021 gentamicin DOSE topical 0.1 % ointment - apply thin layer to wound with dressing changes Electronic Signature(s) Signed: 08/09/2021 12:21:54 PM By: Fredirick Maudlin MD FACS Previous Signature: 08/09/2021 8:06:44 AM Version By: Fredirick Maudlin MD FACS Entered By: Fredirick Maudlin on 08/09/2021 08:18:00 -------------------------------------------------------------------------------- Problem List Details Patient Name: Date of Service: [name] THA N L. 08/09/2021 7:45 A M Medical Record Number: [id number] Patient Account Number: [id number] Date of Birth/Sex: Treating RN: May 06, 1955 (66 y.o. M) Primary Care Provider: [name], Alyssa Other Clinician: Referring Provider: Treating Provider/Extender: Orland Jarred in Treatment: 40 Active Problems ICD-10 Encounter Code Description Active Date MDM Diagnosis L97.511 Non-pressure chronic ulcer of other part of right foot limited to breakdown of 07/07/2020 No Yes skin E11.621 Type 2 diabetes mellitus with foot ulcer 07/07/2020 No Yes E11.51 Type 2 diabetes mellitus with diabetic peripheral angiopathy without gangrene 09/01/2020 No Yes E11.42  Type 2 diabetes mellitus with diabetic polyneuropathy 07/07/2020 No Yes Inactive Problems ICD-10 Code Description Active Date Inactive Date L97.528 Non-pressure chronic ulcer of other part of left foot with other specified severity 07/07/2020 07/07/2020 L89.152 Pressure ulcer of sacral region, stage 2 07/07/2020 07/07/2020 M42.683 Pressure ulcer of left heel, stage 2 07/07/2020 07/07/2020 Resolved Problems Electronic Signature(s) Signed: 08/09/2021 8:02:39 AM By: Fredirick Maudlin MD FACS Entered By: Fredirick Maudlin on 08/09/2021 08:02:39 -------------------------------------------------------------------------------- Progress Note Details Patient Name: Date of Service: [name] THA N L. 08/09/2021 7:45 A M Medical Record Number: [id number] Patient Account Number: [id number] Date of Birth/Sex: Treating RN: 11/14/1955 (66 y.o. M) Primary Care Provider: [name], Alyssa Other Clinician: Referring Provider: Treating Provider/Extender: Orland Jarred in Treatment: 70 Subjective Chief Complaint Information obtained from Patient 07/07/2020; patient is in clinic for today for review of pressure ulcers on his coccyx and left heel and diabetic foot ulcers bilaterally. History of Present Illness (HPI) ADMISSION 07/07/2020 This is a 66 year old man who is a type II diabetic. I have reviewed this in epic. It appeared that his diabetes was diagnosed in 2009 at which time he was on metformin. He was followed for a period of time by an endocrinologist with [facility] to always labeled him as a type II diabetic. Recently in epic there is been a switch to type 1 diabetes. If there were defining antibody test done I do not see them and I think the totality of the evidence would suggest that he is a type II diabetic. His recent problem started in May when he fell down 17 stairs. He suffered a scapular fracture he was hospitalized at [facility] for 9 days and then discharged to [facility]. His mother  who is present said that most of these wounds seem to develop surrounding the discharge from hospital or the stay at [facility]. He has an area on his lower sacrum which appears currently to be a stage II but per their description this is actually improved quite a bit. He has an area on the left lateral foot at roughly the base of the fifth metatarsal, the left Achilles heel and the right fifth metatarsal head laterally they have been using topical antibiotics. Seen by primary care yesterday and placed on Keflex The patient had arterial studies on 05/05/2020. I wondered whether this had something to do with wounds on his feet at that time but I have not been able to determine this. At that time his ABI on the right was 1.09 with a TBI of 0.65 on the left 0.88 with  a TBI of 0.44. Waveforms were triphasic on the right monophasic on the left. He likely has significant PAD on the left Past medical history includes type 2 diabetes poorly controlled last hemoglobin A1c at 12.7 in May, hypertension, hyperlipidemia, atrial fib on Eliquis, coronary artery disease status post CABG, left scapular fracture in May of this year, AAA repair. Bilateral leg weakness ABIs were not done in our clinic as they were just done in May of this year at [facility] 7/14; patient arrives back in clinic he has areas on the left Achilles heel, base of the left fifth metatarsal, right lateral fifth metatarsal. The area he had over the coccyx appears to be healed but this area remains vulnerable. We have been using Iodoflex. I rereviewed his ABIs which were done in May. I do not know exactly what prompted these test however they do show a TBI of 0.44 on the left and monophasic waveforms. 7/21; the patient's wounds are about the same. Certainly no healing and the nurses noted some green drainage on the right fifth metatarsal head, left Achilles a little larger. We have been using silver alginate really without any  improvement. We did get an appointment with vascular surgery however its that it did the end of August and they seem to want to repeat the noninvasive test that they have already done in May. I am not sure what the issue here is. We advised the patient's sister to call and point to the idea that they have already had noninvasive studies. The real question I have is does this patient need an angiogram. He is a diabetic and it is possible that the ABIs are falsely elevated because of medial calcification. At least on the left his TBI was abnormal 08/08/2020 upon evaluation today patient appears to be doing about the same in regard to his wounds. He still waiting the appointment with vascular. This is something that hopefully should be undertaken shortly. The 29th of this month is when he is actually scheduled although they have put in a call to move this up if at all possible. Fortunately there does not appear to be any signs of active infection at this time. No fevers, chills, nausea, vomiting, or diarrhea. 8/18; appointment with Dr. Trula Slade on 8/29. The patient has wounds on the left Achilles heel, left lateral foot and the right lateral fifth metatarsal head. We have been using Santyl under Hydrofera Blue. The surface of especially the left heel looks better to me than what I remember. 9/1; since the patient was last here he underwent angiography by Dr. Trula Slade he also underwent noninvasive studies first showing a ABI of of 0.64 with monophasic waveforms. His TBI was 0.35 with a left great toe pressure of 46 this is about the same pattern as we saw previously. The left was a lot worse than the right. He underwent an angiogram on 08/29/2020. He had multiple 80% peroneal artery stenosis which was his dominant vessel which was treated with angioplasty with no residual stenosis. 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 The anterior tibial artery was also occluded. An attempt was made at recanalization  of the anterior tibial artery however this was unsuccessful. Noted that the peroneal artery reconstitutes the posterior tibial artery and the anterior tibial artery creating the plantar arch. The patient has areas on the Achilles left heel left lateral foot and right lateral fifth metatarsal head we have been using Santyl and Hydrofera Blue 9/22; since the patient was last here  he went for repeat vascular studies. On the right his ABI was 0.94 with biphasic waveforms TBI of 0.73 with a great toe pressure of 75. On the left which is the problematic wound his ABI was 1.08 but with biphasic waveforms at the PTA. Biphasic waveforms at the dorsalis pedis his TBI was 0.63 with a great toe pressure of 65. On the left however this does represent a fairly marked improvement. We have been using Santyl and Hydrofera Blue 9/29 left Achilles area. Still is having necrotic surface. Bleeds very easily as the patient is on a combination of Eliquis aspirin and Plavix just stopped yesterday. I am not going to do any debridement until next week. We are using Santyl and Hydrofera Blue. His arterial status on the right was actually quite good. The area on the fifth metatarsal head needs additional debridement. A total contact cast in this area is not out of the question. He does describe claudication I believe with minimal activity. He is not very active X-ray we ordered last week is still pending in terms of the report 10/6 left Achilles area. In terms of the necrotic surface this is quite a bit better. There is still some debris in the posterior part of the wound however this looks better. Anteriorly the wound appears to be epithelialized there is still a divot in the middle of this but this does not probe to deep tissue. The area on the right fifth metatarsal head laterally really is not making as much progress as I would have liked. Still not completely a viable surface. Both the patient and his wife agree that he is  not an external rotator in terms of ambulation [an outie]. We have been using Santyl and Hydrofera Blue for a prolonged period. On the left Achilles which looked like it is worse wound this seems to be making some progress not so much on the right foot. The patient and his wife state that they are rigorous about offloading both of these areas He was treated with angioplasty as I believe on the right peroneal artery that had multiple stenosis. This is a dominant vessel the anterior tibial and posterior tibial arteries are occluded however the foot is fed via collaterals from the peroneal. From my notes I am not able to understand the vascular supply on the left I will have to review this. 10/13; left Achilles top part of this looks healthy granulation in the bottom part of this wound again completely necrotic requiring debridement. oo The area on the right lateral fifth met head also requires debridement. The majority of this is epithelialized however he she there is a refractory part to this. 10/20; left Achilles 100% better looking wound surface. Necrotic upper 50% seems to have resolved at least by inspection under illumination. Culture I did of this area last week postdebridement showed Proteus and Citrobacter. Both sensitive to quinolones and I put him on ciprofloxacin. We have been using silver alginate on the wound on the heel and Santyl and Hydrofera Blue on the right fifth met head. 10/27; left Achilles is measuring much smaller we are using topical gentamicin under silver alginate. Right fifth met head still fibrinous debris on the surface we are using Santyl and Hydrofera Blue. Patient lost his brother suddenly last week he was unable to keep his vascular surgery appointment. 11/3; left Achilles not as viable surface as I might like. Some hypergranulation. Right fifth metatarsal head still completely nonviable surface. It turns out that she was not  using topical gentamicin on the left heel  [mother] they have been using Santyl and Hydrofera Blue on the right fifth metatarsal head. The patient did have noninvasive vascular studies on 10/ 28/22. He had triphasic waveforms up to the level of the proximal PTA and then a 50 to 74% stenosis in the mid PTA. Peroneal artery was triphasic as was the anterior tibial. He follows up with Dr. Trula Slade on 11/7. 11/17; since the patient was last here he saw Dr. Trula Slade and went for ANGIOGRAPHY on 11/15/2020. He has wounds on the left Achilles heel and the right lateral fifth metatarsal head. He has known PAD and had previous revascularization in August. On this occasion on the right his right common femoral, profundofemoral and superficial femoral and popliteal arteries were widely patent the dominant vessel is the posterior tibial which had several areas of stenosis greater than 80% he went underwent angioplasty in this area. He had no interventions on the left however he is left common femoral, profundofemoral and superficial femoral arteries were widely patent as well as the popliteal he has recurrent stenosis greater than 50% within the peroneal artery which is a single- vessel runoff. The thought was that he may require further angiography and reangioplasty on the left peroneal artery. Overall the patient has severe PAD he has undergone angioplasty of the right posterior tibial artery. He arrives in clinic today with the Achilles heel area on the left surprisingly better with marked improvement in the surface area on the right not nearly as good but stable 12/8; patient has follow-up arterial Doppler studies on 12/19 at the [facility]. He is now being revascularized on both sides. Fortunately on the right Achilles area this is just about closed slight scabbing/eschar. I did not remove this today. The area on the right fifth met head looked better after last week's debridement we have been using Santyl and Hydrofera Blue 01/05/21 the patient has  not been here in almost a month. He was supposed to have follow-up Doppler studies on 12/19 look these up. He has been revascularized on both sides The left Achilles area is closed he does not need to dress this but will need to protect this area on an ongoing basis likely indefinitely. He still has the area on the right fifth plantar metatarsal head. The area is measuring smaller he has a rim of epithelialization. The granulation tissue still a bit gelatinous for my liking 1/26; The left Achilles remains closed. Plantar right fifth metatarsal head is smaller and appears to be healthy. He has not followed up with vascular surgery and does not seem particularly interested. He did not get his follow-up arterial Dopplers. I explained to him that angioplasty of the small arteries in his leg is a limb saving procedure but possibility of restenosis is reasonably high and he should follow-up with follow-up noninvasive studies. Frankly he did not seem interested today 03/30/2021: The patient has not been seen here since the end of January. He never followed up with vascular surgery and today states that he refuses to be seen at [facility] due to his dissatisfaction with the fact that his arteries seem to restenose. The wound on his left Achilles is still closed. The plantar right fifth metatarsal head is quite a bit bigger than it was at his last visit. He is accompanied by his mother who reports that it had gotten down to a very small size but then she went out of town for a week and during that  time it enlarged to its current dimensions. The patient is wearing the functional equivalent of house slippers for shoes and is not making any effort to offload. He refuses to wear a forefoot offloading sandal due to instability on his feet and says that when a total contact cast was attempted, it was "a total disaster" and he is unwilling to consider this again. He has been in PolyMem silver since at least the beginning of  January. 04/26/2021: The patient is here today after about a month. He has been using Hydrofera Blue to his wound and today it is markedly improved. It is small and clean. He is scheduled to see Dr. Gwenlyn Found in interventional cardiology tomorrow. 05/24/2021: Once again, the patient returns after about a month. I had referred him to Dr. Gwenlyn Found and the patient walked out from that appointment. Apparently there was some confusion over why he was seeing the cardiologist for his peripheral arterial disease. He saw his primary cardiologist, Dr. Davina Poke, last week and has been re-referred to see Dr. Gwenlyn Found, the appointment is tomorrow. Basically the wound is a little bit bigger today but clean without any slough accumulation. The periwound callus that has accumulated is completely macerated. I thought he had been in [facility], but the dressing that had been removed and discarded today looks more like PolyMem Ag. 05/31/2021: The patient decided not to see Dr. Gwenlyn Found because he feels like anytime he has a vascular intervention, the arteries just closed up again and he does not want to do this anymore. He does not really grasp the nature of the disease process. The wound as a bit smaller today and very clean. No accumulation of periwound callus. We have been using Hydrofera Blue. 06/07/2021: The wound looks good today. It is a little bit smaller without any significant periwound callus accumulation. The surface is clean with good granulation tissue. 06/20/2021: The patient's mother was out of town last week and so he could not come to clinic. He did have help with his wound care during her absence. Today, the wound looks very good. It is a little bit smaller. There is some slough accumulation on the surface. 06/27/2021: The wound is about the same size today, but the surface is fairly clean. There is some dead skin around the perimeter, but no real callus accumulation. Thin layer of slough. 07/11/2021: The wound is  now flush with the surrounding skin. It has contracted considerably. There is just a light layer of slough and biofilm on the surface. 07/26/2021: The patient was absent from clinic last week due to illness. T oday, the skin around the wound appears to have gotten macerated and the wound is larger, as a result. It remains flush with the surrounding skin and has a nice beefy surface. 08/09/2021: The wound is quite a bit smaller today but does have some undermining and a light layer of callus. The intake nurse noted some purulent material coming from the wound when she remove the dressing, but did not find any additional pockets to express. On my evaluation, the surface is clean and the wound does not appear to be grossly infected. Patient History Information obtained from Patient. Family History Cancer - Paternal Grandparents,Father, Heart Disease - Father, Hypertension - Father, No family history of Diabetes, Hereditary Spherocytosis, Kidney Disease, Lung Disease, Seizures, Stroke, Thyroid Problems, Tuberculosis. Social History Current some day smoker - Landscape architect, Marital Status - Divorced, Alcohol Use - Rarely, Drug Use - No History, Caffeine Use - Daily. Medical History Cardiovascular  Patient has history of Coronary Artery Disease, Hypertension Endocrine Patient has history of Type II Diabetes Musculoskeletal Patient has history of Osteoarthritis Neurologic Patient has history of Neuropathy Objective Constitutional Bradycardic, asymptomatic.Marland Kitchen No acute distress.. Vitals Time Taken: 7:47 AM, Height: 74 in, Weight: 151 lbs, BMI: 19.4, Temperature: 98.8 F, Pulse: 51 bpm, Respiratory Rate: 16 breaths/min, Blood Pressure: 139/76 mmHg. Respiratory Normal work of breathing on room air.. General Notes: 08/09/2021: The wound is quite a bit smaller today but does have some undermining and a light layer of callus. The intake nurse noted some purulent material coming from the wound when she remove the  dressing, but did not find any additional pockets to express. On my evaluation, the surface is clean and the wound does not appear to be grossly infected. Integumentary (Hair, Skin) Wound #3 status is Open. Original cause of wound was Gradually Appeared. The date acquired was: 06/21/2020. The wound has been in treatment 56 weeks. The wound is located on the Right,Lateral Foot. The wound measures 0.3cm length x 0.4cm width x 0.1cm depth; 0.094cm^2 area and 0.009cm^3 volume. There is Fat Layer (Subcutaneous Tissue) exposed. There is no tunneling noted, however, there is undermining starting at 12:00 and ending at 12:00 with a maximum distance of 0.2cm. There is a medium amount of purulent drainage noted. The wound margin is flat and intact. There is large (67-100%) red, pink granulation within the wound bed. There is a small (1-33%) amount of necrotic tissue within the wound bed including Adherent Slough. Assessment Active Problems ICD-10 Non-pressure chronic ulcer of other part of right foot limited to breakdown of skin Type 2 diabetes mellitus with foot ulcer Type 2 diabetes mellitus with diabetic peripheral angiopathy without gangrene Type 2 diabetes mellitus with diabetic polyneuropathy Procedures Wound #3 Pre-procedure diagnosis of Wound #3 is a Diabetic Wound/Ulcer of the Lower Extremity located on the Right,Lateral Foot .Severity of Tissue Pre Debridement is: Fat layer exposed. There was a Selective/Open Wound Skin/Epidermis Debridement with a total area of 1 sq cm performed by Fredirick Maudlin, MD. With the following instrument(s): Curette to remove Non-Viable tissue/material. Material removed includes Callus and Skin: Epidermis and after achieving pain control using Lidocaine 4% T opical Solution. No specimens were taken. A time out was conducted at 07:56, prior to the start of the procedure. A Minimum amount of bleeding was controlled with Pressure. The procedure was tolerated well with a  pain level of 0 throughout and a pain level of 0 following the procedure. Post Debridement Measurements: 0.6cm length x 0.5cm width x 0.1cm depth; 0.024cm^3 volume. Character of Wound/Ulcer Post Debridement is improved. Severity of Tissue Post Debridement is: Fat layer exposed. Post procedure Diagnosis Wound #3: Same as Pre-Procedure Plan Follow-up Appointments: Return Appointment in 1 week. - Dr. Celine Ahr - Room 2 - 8/16 at 7:45 AM Bathing/ Shower/ Hygiene: May shower and wash wound with soap and water. - with dressing changes only. Edema Control - Lymphedema / SCD / Other: Elevate legs to the level of the heart or above for 30 minutes daily and/or when sitting, a frequency of: - 3-4 times a day throughout the day. Moisturize legs daily. - every night before bed. *** closely monitor feet every night before bed. Off-Loading: Other: - float heels while resting in bed or chair. Use a pillow or rolled sheet under legs to offload the heels. Ensure no pressure to right lateral foot. Home Health: No change in wound care orders this week; continue Home Health for wound care. May utilize  formulary equivalent dressing for wound treatment orders unless otherwise specified. Other Home Health Orders/Instructions: - [facility] home health weekly changes and all other days family to change. The following medication(s) was prescribed: gentamicin topical 0.1 % ointment apply thin layer to wound with dressing changes starting 08/09/2021 WOUND #3: - Foot Wound Laterality: Right, Lateral Cleanser: Soap and Water Every Other Day/15 Days Discharge Instructions: May shower and wash wound with dial antibacterial soap and water prior to dressing change. Cleanser: Wound Cleanser Every Other Day/15 Days Discharge Instructions: Cleanse the wound with wound cleanser prior to applying a clean dressing using gauze sponges, not tissue or cotton balls. Peri-Wound Care: Zinc Oxide Ointment 30g tube Every Other Day/15  Days Discharge Instructions: Apply Zinc Oxide to periwound with each dressing change Prim Dressing: Promogran Prisma Matrix, 4.34 (sq in) (silver collagen) (Generic) Every Other Day/15 Days ary Discharge Instructions: Moisten collagen with saline or hydrogel Secondary Dressing: ALLEVYN Gentle Border, 3x3 (in/in) Every Other Day/15 Days Discharge Instructions: Apply over primary dressing as directed. 08/09/2021: The wound is quite a bit smaller today but does have some undermining and a light layer of callus. The intake nurse noted some purulent material coming from the wound when she remove the dressing, but did not find any additional pockets to express. On my evaluation, the surface is clean and the wound does not appear to be grossly infected. I used a curette to debride the periwound callus and skin that was creating the undermining. I did not encounter any further purulent drainage, but based upon the intake findings, I am going to prescribe topical gentamicin; I do not think he warrants a systemic antibiotic at this time. Will continue using the Prisma silver collagen. Follow-up in 1 week. Electronic Signature(s) Signed: 08/09/2021 8:19:07 AM By: Fredirick Maudlin MD FACS Entered By: Fredirick Maudlin on 08/09/2021 08:19:07 -------------------------------------------------------------------------------- HxROS Details Patient Name: Date of Service: [name] [name] HNA THA N L. 08/09/2021 7:45 A M Medical Record Number: [id number] Patient Account Number: [id number] Date of Birth/Sex: Treating RN: Jul 01, 1955 (67 y.o. M) Primary Care Provider: [name], Alyssa Other Clinician: Referring Provider: Treating Provider/Extender: Thana Ates, Alyssa Weeks in Treatment: 79 Information Obtained From Patient Cardiovascular Medical History: Positive for: Coronary Artery Disease; Hypertension Endocrine Medical History: Positive for: Type II Diabetes Treated with: Insulin Blood sugar  tested every day: Yes Tested : CGM Musculoskeletal Medical History: Positive for: Osteoarthritis Neurologic Medical History: Positive for: Neuropathy Immunizations Pneumococcal Vaccine: Received Pneumococcal Vaccination: Yes Received Pneumococcal Vaccination On or After 60th Birthday: Yes Implantable Devices None Family and Social History Cancer: Yes - Paternal Grandparents,Father; Diabetes: No; Heart Disease: Yes - Father; Hereditary Spherocytosis: No; Hypertension: Yes - Father; Kidney Disease: No; Lung Disease: No; Seizures: No; Stroke: No; Thyroid Problems: No; Tuberculosis: No; Current some day smoker - Cigars; Marital Status - Divorced; Alcohol Use: Rarely; Drug Use: No History; Caffeine Use: Daily; Financial Concerns: No; Food, Clothing or Shelter Needs: No; Support System Lacking: No; Transportation Concerns: No Electronic Signature(s) Signed: 08/09/2021 12:21:54 PM By: Fredirick Maudlin MD FACS Entered By: Fredirick Maudlin on 08/09/2021 08:03:58 -------------------------------------------------------------------------------- SuperBill Details Patient Name: Date of Service: [name] [name] HNA THA N L. 08/09/2021 Medical Record Number: [id number] Patient Account Number: [id number] Date of Birth/Sex: Treating RN: 05/29/1955 (66 y.o. M) Primary Care Provider: [name], Alyssa Other Clinician: Referring Provider: Treating Provider/Extender: Thana Ates, Alyssa Weeks in Treatment: 60 Diagnosis Coding ICD-10 Codes Code Description L97.511 Non-pressure chronic ulcer of other part of right foot limited to breakdown of skin  E11.621 Type 2 diabetes mellitus with foot ulcer E11.51 Type 2 diabetes mellitus with diabetic peripheral angiopathy without gangrene E11.42 Type 2 diabetes mellitus with diabetic polyneuropathy Facility Procedures CPT4 Code: [id number] Description: [phone] - DEBRIDE WOUND 1ST 20 SQ CM OR < ICD-10 Diagnosis Description L97.511 Non-pressure chronic ulcer  of other part of right foot limited to breakdown of s Modifier: kin Quantity: 1 Physician Procedures : CPT4 Code Description Modifier [id number] - WC PHYS LEVEL 4 - EST PT 25 ICD-10 Diagnosis Description L97.511 Non-pressure chronic ulcer of other part of right foot limited to breakdown of skin E11.621 Type 2 diabetes mellitus with foot ulcer E11.51  Type 2 diabetes mellitus with diabetic peripheral angiopathy without gangrene E11.42 Type 2 diabetes mellitus with diabetic polyneuropathy Quantity: 1 : [id number] - WC PHYS DEBR WO ANESTH 20 SQ CM ICD-10 Diagnosis Description L97.511 Non-pressure chronic ulcer of other part of right foot limited to breakdown of skin Quantity: 1 Electronic Signature(s) Signed: 08/09/2021 8:20:35 AM By: Fredirick Maudlin MD FACS Entered By: Fredirick Maudlin on 08/09/2021 08:20:35

## 2021-08-09 NOTE — Progress Notes (Signed)
[name] [name] ([id number]) Visit Report for 08/09/2021 Arrival Information Details Patient Name: Date of Service: [name] [name] [facility] THA N L. 08/09/2021 7:45 A M Medical Record Number: [id number] Patient Account Number: [id number] Date of Birth/Sex: Treating RN: 1955/02/10 (66 y.o. [name] Primary Care Maudean Hoffmann: [name], Alyssa Other Clinician: Referring Shelvy Perazzo: Treating Madisson Kulaga/Extender: Orland Jarred in Treatment: 22 Visit Information History Since Last Visit Added or deleted any medications: No Patient Arrived: Walker Any new allergies or adverse reactions: No Arrival Time: 07:44 Had a fall or experienced change in No Accompanied By: mother activities of daily living that may affect Transfer Assistance: None risk of falls: Patient Identification Verified: Yes Signs or symptoms of abuse/neglect since last visito No Secondary Verification Process Completed: Yes Hospitalized since last visit: No Patient Requires Transmission-Based Precautions: No Implantable device outside of the clinic excluding No Patient Has Alerts: Yes cellular tissue based products placed in the center Patient Alerts: Patient on Blood Thinner since last visit: Has Dressing in Place as Prescribed: Yes Pain Present Now: No Electronic Signature(s) Signed: 08/09/2021 5:04:31 PM By: Adline Peals Entered By: Adline Peals on 08/09/2021 07:45:13 -------------------------------------------------------------------------------- Encounter Discharge Information Details Patient Name: Date of Service: [name] [name] [facility] THA N L. 08/09/2021 7:45 A M Medical Record Number: [id number] Patient Account Number: [id number] Date of Birth/Sex: Treating RN: 1955/10/21 (66 y.o. [name] Primary Care Tamirra Sienkiewicz: [name], Alyssa Other Clinician: Referring Pami Wool: Treating  Heskett/Extender: Thana Ates, Alyssa Weeks in Treatment: 1 Encounter Discharge  Information Items Post Procedure Vitals Discharge Condition: Stable Temperature (F): 98.8 Ambulatory Status: Walker Pulse (bpm): 51 Discharge Destination: Home Respiratory Rate (breaths/min): 16 Transportation: Private Auto Blood Pressure (mmHg): 139/76 Accompanied By: mother Schedule Follow-up Appointment: Yes Clinical Summary of Care: Patient Declined Electronic Signature(s) Signed: 08/09/2021 5:04:31 PM By: Adline Peals Entered By: Adline Peals on 08/09/2021 08:05:02 -------------------------------------------------------------------------------- Lower Extremity Assessment Details Patient Name: Date of Service: [name] [name] [facility] THA N L. 08/09/2021 7:45 A M Medical Record Number: [id number] Patient Account Number: [id number] Date of Birth/Sex: Treating RN: 05-28-1955 (66 y.o. [name] Primary Care Kimmie Doren: [name], Alyssa Other Clinician: Referring Dierks Wach: Treating Ilithyia Titzer/Extender: Fredirick Maudlin [name], Alyssa Weeks in Treatment: 56 Edema Assessment Assessed: [Left: No] [Right: No] Edema: [Left: Ye] [Right: s] Calf Left: Right: Point of Measurement: 34 cm From Medial Instep 30.8 cm Ankle Left: Right: Point of Measurement: 10 cm From Medial Instep 23.4 cm Electronic Signature(s) Signed: 08/09/2021 5:04:31 PM By: Adline Peals Entered By: Adline Peals on 08/09/2021 07:47:42 -------------------------------------------------------------------------------- Multi Wound Chart Details Patient Name: Date of Service: [name] [name] [facility] THA N L. 08/09/2021 7:45 A M Medical Record Number: [id number] Patient Account Number: [id number] Date of Birth/Sex: Treating RN: 10/04/1955 (66 y.o. M) Primary Care Alexiz Cothran: [name], Alyssa Other Clinician: Referring Teri Legacy: Treating Ruthie Berch/Extender: Fredirick Maudlin [name], Alyssa Weeks in Treatment: 23 Vital Signs Height(in): 74 Pulse(bpm): 71 Weight(lbs): 151 Blood Pressure(mmHg):  139/76 Body Mass Index(BMI): 19.4 Temperature(F): 98.8 Respiratory Rate(breaths/min): 16 Photos: [N/A:N/A] Right, Lateral Foot N/A N/A Wound Location: Gradually Appeared N/A N/A Wounding Event: Diabetic Wound/Ulcer of the Lower N/A N/A Primary Etiology: Extremity Coronary Artery Disease, N/A N/A Comorbid History: Hypertension, Type II Diabetes, Osteoarthritis, Neuropathy 06/21/2020 N/A N/A Date Acquired: 66 N/A N/A Weeks of Treatment: Open N/A N/A Wound Status: No N/A N/A Wound Recurrence: 0.3x0.4x0.1 N/A N/A Measurements L x W x D (cm) 0.094 N/A N/A A (cm) : rea 0.009 N/A N/A Volume (cm) : 95.30% N/A N/A % Reduction in A  rea: 97.80% N/A N/A % Reduction in Volume: 12 Starting Position 1 (o'clock): 12 Ending Position 1 (o'clock): 0.2 Maximum Distance 1 (cm): Yes N/A N/A Undermining: Grade 2 N/A N/A Classification: Medium N/A N/A Exudate A mount: Purulent N/A N/A Exudate Type: yellow, brown, green N/A N/A Exudate Color: Flat and Intact N/A N/A Wound Margin: Large (67-100%) N/A N/A Granulation A mount: Red, Pink N/A N/A Granulation Quality: Small (1-33%) N/A N/A Necrotic A mount: Fat Layer (Subcutaneous Tissue): Yes N/A N/A Exposed Structures: Fascia: No Tendon: No Muscle: No Joint: No Bone: No Medium (34-66%) N/A N/A Epithelialization: Debridement - Selective/Open Wound N/A N/A Debridement: Pre-procedure Verification/Time Out 07:56 N/A N/A Taken: Lidocaine 4% Topical Solution N/A N/A Pain Control: Callus N/A N/A Tissue Debrided: Skin/Epidermis N/A N/A Level: 1 N/A N/A Debridement A (sq cm): rea Curette N/A N/A Instrument: Minimum N/A N/A Bleeding: Pressure N/A N/A Hemostasis A chieved: 0 N/A N/A Procedural Pain: 0 N/A N/A Post Procedural Pain: Procedure was tolerated well N/A N/A Debridement Treatment Response: 0.6x0.5x0.1 N/A N/A Post Debridement Measurements L x W x D (cm) 0.024 N/A N/A Post Debridement Volume:  (cm) Debridement N/A N/A Procedures Performed: Treatment Notes Electronic Signature(s) Signed: 08/09/2021 8:02:46 AM By: Fredirick Maudlin MD FACS Entered By: Fredirick Maudlin on 08/09/2021 08:02:46 -------------------------------------------------------------------------------- Multi-Disciplinary Care Plan Details Patient Name: Date of Service: [name] [name] [facility] THA N L. 08/09/2021 7:45 A M Medical Record Number: [id number] Patient Account Number: [id number] Date of Birth/Sex: Treating RN: 1955/11/10 (66 y.o. [name] Primary Care Ladd Cen: [name], Alyssa Other Clinician: Referring Neilan Rizzo: Treating Festus Pursel/Extender: Orland Jarred in Treatment: [place] reviewed with physician Active Inactive Wound/Skin Impairment Nursing Diagnoses: Knowledge deficit related to ulceration/compromised skin integrity Goals: Patient/caregiver will verbalize understanding of skin care regimen Date Initiated: 07/07/2020 Target Resolution Date: 09/01/2021 Goal Status: Active Interventions: Assess patient/caregiver ability to obtain necessary supplies Assess patient/caregiver ability to perform ulcer/skin care regimen upon admission and as needed Provide education on ulcer and skin care Treatment Activities: Skin care regimen initiated : 07/07/2020 Topical wound management initiated : 07/07/2020 Notes: Electronic Signature(s) Signed: 08/09/2021 5:04:31 PM By: Adline Peals Entered By: Adline Peals on 08/09/2021 07:53:03 -------------------------------------------------------------------------------- Pain Assessment Details Patient Name: Date of Service: [name] [name] [facility] THA N L. 08/09/2021 7:45 A M Medical Record Number: [id number] Patient Account Number: [id number] Date of Birth/Sex: Treating RN: [birth year] (66 y.o. [name] Primary Care Rosabella Edgin: [name], Alyssa Other Clinician: Referring Shauntea Lok: Treating  Athena Baltz/Extender: Thana Ates, Alyssa Weeks in Treatment: 10 Active Problems Location of Pain Severity and Description of Pain Patient Has Paino No Site Locations Rate the pain. Current Pain Level: 0 Pain Management and Medication Current Pain Management: Electronic Signature(s) Signed: 08/09/2021 5:04:31 PM By: Adline Peals Entered By: Adline Peals on 08/09/2021 07:47:39 -------------------------------------------------------------------------------- Patient/Caregiver Education Details Patient Name: Date of Service: [name] [name] [facility] THA N Carlean Jews 8/9/2023andnbsp7:45 A M Medical Record Number: [id number] Patient Account Number: [id number] Date of Birth/Gender: Treating RN: 03/23/1955 (66 y.o. [name] Primary Care Physician: [name], Alyssa Other Clinician: Referring Physician: Treating Physician/Extender: Orland Jarred in Treatment: 52 Education Assessment Education Provided To: Patient Education Topics Provided Wound/Skin Impairment: Methods: Explain/Verbal Responses: Reinforcements needed, State content correctly Electronic Signature(s) Signed: 08/09/2021 5:04:31 PM By: Adline Peals Entered By: Adline Peals on 08/09/2021 07:53:15 -------------------------------------------------------------------------------- Wound Assessment Details Patient Name: Date of Service: [name] [name] [facility] THA N L. 08/09/2021 7:45 A M Medical Record Number: [id number] Patient Account Number: [id number]  Date of Birth/Sex: Treating RN: May 02, 1955 (66 y.o. [name] Primary Care Brode Sculley: [name], Alyssa Other Clinician: Referring Taquisha Phung: Treating [place] Hair/Extender: Fredirick Maudlin [name], Alyssa Weeks in Treatment: 1 Wound Status Wound Number: 3 Primary Diabetic Wound/Ulcer of the Lower Extremity Etiology: Wound Location: Right, Lateral Foot Wound Open Wounding Event: Gradually Appeared Status: Date  Acquired: 06/21/2020 Comorbid Coronary Artery Disease, Hypertension, Type II Diabetes, Weeks Of Treatment: 56 History: Osteoarthritis, Neuropathy Clustered Wound: No Photos Wound Measurements Length: (cm) 0.3 Width: (cm) 0.4 Depth: (cm) 0.1 Area: (cm) 0.094 Volume: (cm) 0.009 % Reduction in Area: 95.3% % Reduction in Volume: 97.8% Epithelialization: Medium (34-66%) Tunneling: No Undermining: Yes Starting Position (o'clock): 12 Ending Position (o'clock): 12 Maximum Distance: (cm) 0.2 Wound Description Classification: Grade 2 Wound Margin: Flat and Intact Exudate Amount: Medium Exudate Type: Purulent Exudate Color: yellow, brown, green Foul Odor After Cleansing: No Slough/Fibrino Yes Wound Bed Granulation Amount: Large (67-100%) Exposed Structure Granulation Quality: Red, Pink Fascia Exposed: No Necrotic Amount: Small (1-33%) Fat Layer (Subcutaneous Tissue) Exposed: Yes Necrotic Quality: Adherent Slough Tendon Exposed: No Muscle Exposed: No Joint Exposed: No Bone Exposed: No Treatment Notes Wound #3 (Foot) Wound Laterality: Right, Lateral Cleanser Soap and Water Discharge Instruction: May shower and wash wound with dial antibacterial soap and water prior to dressing change. Wound Cleanser Discharge Instruction: Cleanse the wound with wound cleanser prior to applying a clean dressing using gauze sponges, not tissue or cotton balls. Peri-Wound Care Zinc Oxide Ointment 30g tube Discharge Instruction: Apply Zinc Oxide to periwound with each dressing change Topical Primary Dressing Promogran Prisma Matrix, 4.34 (sq in) (silver collagen) Discharge Instruction: Moisten collagen with saline or hydrogel Secondary Dressing ALLEVYN Gentle Border, 3x3 (in/in) Discharge Instruction: Apply over primary dressing as directed. Secured With Compression Wrap Compression Stockings Environmental education officer) Signed: 08/09/2021 5:04:31 PM By: Adline Peals Entered By:  Adline Peals on 08/09/2021 07:51:57 -------------------------------------------------------------------------------- Vitals Details Patient Name: Date of Service: [name] [name] [facility] THA N L. 08/09/2021 7:45 A M Medical Record Number: [id number] Patient Account Number: [id number] Date of Birth/Sex: Treating RN: March 26, 1955 (66 y.o. [name] Primary Care Jessi Jessop: [name], Alyssa Other Clinician: Referring Aura Bibby: Treating Mccauley Diehl/Extender: Fredirick Maudlin [name], Alyssa Weeks in Treatment: 43 Vital Signs Time Taken: 07:47 Temperature (F): 98.8 Height (in): 74 Pulse (bpm): 51 Weight (lbs): 151 Respiratory Rate (breaths/min): 16 Body Mass Index (BMI): 19.4 Blood Pressure (mmHg): 139/76 Reference Range: 80 - 120 mg / dl Electronic Signature(s) Signed: 08/09/2021 5:04:31 PM By: Adline Peals Entered By: Adline Peals on 08/09/2021 07:47:13

## 2021-08-10 ENCOUNTER — Ambulatory Visit (INDEPENDENT_AMBULATORY_CARE_PROVIDER_SITE_OTHER): Payer: PPO | Admitting: Ophthalmology

## 2021-08-10 ENCOUNTER — Encounter (INDEPENDENT_AMBULATORY_CARE_PROVIDER_SITE_OTHER): Payer: Self-pay | Admitting: Ophthalmology

## 2021-08-10 DIAGNOSIS — I10 Essential (primary) hypertension: Secondary | ICD-10-CM | POA: Diagnosis not present

## 2021-08-10 DIAGNOSIS — E113513 Type 2 diabetes mellitus with proliferative diabetic retinopathy with macular edema, bilateral: Secondary | ICD-10-CM

## 2021-08-10 DIAGNOSIS — H25813 Combined forms of age-related cataract, bilateral: Secondary | ICD-10-CM | POA: Diagnosis not present

## 2021-08-10 DIAGNOSIS — H4312 Vitreous hemorrhage, left eye: Secondary | ICD-10-CM | POA: Diagnosis not present

## 2021-08-10 DIAGNOSIS — H35033 Hypertensive retinopathy, bilateral: Secondary | ICD-10-CM

## 2021-08-10 MED ORDER — BEVACIZUMAB CHEMO INJECTION 1.25MG/0.05ML SYRINGE FOR KALEIDOSCOPE
1.2500 mg | INTRAVITREAL | Status: AC | PRN
  Administered 2021-08-10: 1.25 mg via INTRAVITREAL

## 2021-08-11 ENCOUNTER — Encounter: Payer: Self-pay | Admitting: Internal Medicine

## 2021-08-11 ENCOUNTER — Ambulatory Visit (INDEPENDENT_AMBULATORY_CARE_PROVIDER_SITE_OTHER): Payer: HMO | Admitting: Internal Medicine

## 2021-08-11 DIAGNOSIS — B0229 Other postherpetic nervous system involvement: Secondary | ICD-10-CM | POA: Diagnosis not present

## 2021-08-11 HISTORY — DX: Other postherpetic nervous system involvement: B02.29

## 2021-08-11 MED ORDER — DICLOFENAC SODIUM 1 % EX GEL: 2.0000 g | Freq: Four times a day (QID) | CUTANEOUS | Status: DC

## 2021-08-11 MED ORDER — LIDOCAINE 5 % EX OINT: 1.0000 | TOPICAL_OINTMENT | CUTANEOUS | 0 refills | Status: DC | PRN

## 2021-08-11 MED ORDER — TRAMADOL HCL 50 MG PO TABS: 50.0000 mg | ORAL_TABLET | Freq: Three times a day (TID) | ORAL | 0 refills | Status: AC | PRN

## 2021-08-11 NOTE — Progress Notes (Signed)
[name] [name] is a 66 y.o. male who presents today for an office visit.  Assessment/Plan:  Overview: C/o painful burning rash over scapulas r>l.... but no rash visible. ? Possible shingles that maybe resolved.... since its been present 3-4 weeks... but he never looked at it so we dont know if there was ever really a rash- but he felt like it was.   [name] [name] was seen today for rash.  Postherpetic neuralgia Overview: Speculative diagnosis  Assessment & Plan: Offered gabapentin but he declined due to fall risk. He agreed to topicals and tramadol  Orders: -     Diclofenac Sodium -     Lidocaine; Apply 1 Application topically as needed.  Dispense: 35.44 g; Refill: 0 -     traMADol HCl; Take 1 tablet (50 mg total) by mouth every 8 (eight) hours as needed for up to 5 days for severe pain (only for severe pain over 7/10).  Dispense: 15 tablet; Refill: 0    Wrapup printout given: I am diagnosing you with postherpetic neuralgia which is caused by an outbreak of shingles which has now no longer Broca.  All we can do is treat the pain and so I am giving you 2 different topical medications to try lidocaine and Voltaren.  Those 2 should help the pain and you are free to use the one you prefer exclusively.  In addition I am writing tramadol for 15 tablets for you to use for severe pain only and this is a controlled substance so please do not mix it with sedatives or drive on it. I am not 390% certain on the diagnosis but I am pretty confident and so with that in mind I do recommend you return for a shingles vaccine or get the new shingles vaccine at your next visit to our office.  If you develop new or unexpected symptoms please call our office and let us know because again this is a speculative diagnosis as the rash has already resolved   No follow-ups on file.     Subjective:  HPI:  History taking was used to update the overview section of each addressed problem in the  assessment/plan section above.  [name] [name] presents reporting 3 to 4 weeks of sensation of burning itching painful rash along his upper back may be more on the right than the left.  However he cannot really see his back that well and when we took a picture of it today as shown we really could not see much rash where he is having the symptoms although there is a little spots on his left shoulder but that is not really the focus of his symptoms.  He wonders if maybe it could be shingles because it comes out in a band across his upper back and his last shingles shot was over a decade ago.  He also has type 1 diabetes with neuropathy and history of significant atrophy and nerve damage on his left arm        Objective:  Physical Exam: BP 136/60 (BP Location: Right Arm)   Pulse 63   Temp 98.8 F (37.1 C) (Temporal)   Ht '6\' 2"'$  (1.88 m)   Wt 172 lb 6.4 oz (78.2 kg)   SpO2 97%   BMI 22.13 kg/m    Gen: No acute distress, resting comfortably Psych: Normal affect and thought content  Problem specific physical exam findings:  no rash seen; he reports burning pain and itchin in t1/t2 dermatomes on back. It  is warm to touch     No results found for any visits on 08/11/21.        [name] Pacas, MD 08/11/2021 4:36 PM

## 2021-08-11 NOTE — Patient Instructions (Addendum)
I am diagnosing you with postherpetic neuralgia which is caused by an outbreak of shingles which has now no longer broke out.  All we can do is treat the pain and so I am giving you 2 different topical medications to try lidocaine and Voltaren.  Those 2 should help the pain and you are free to use the one you prefer exclusively.  In addition I am writing tramadol for 15 tablets for you to use for severe pain only and this is a controlled substance so please do not mix it with sedatives or drive on it. I am not 505% certain on the diagnosis but I am pretty confident and so with that in mind I do recommend you return for a shingles vaccine or get the new shingles vaccine at your next visit to our office.  If you develop new or unexpected symptoms please call our office and let us know because again this is a speculative diagnosis as the rash has already resolved

## 2021-08-11 NOTE — Assessment & Plan Note (Signed)
Offered gabapentin but he declined due to fall risk. He agreed to topicals and tramadol

## 2021-08-14 ENCOUNTER — Telehealth: Payer: Self-pay | Admitting: Nutrition

## 2021-08-14 NOTE — Telephone Encounter (Signed)
Mother called saying that [facility] can not fill insulin  prescription until a diagnosis code is entered.  Please call this in ASAP

## 2021-08-15 ENCOUNTER — Other Ambulatory Visit: Payer: Self-pay

## 2021-08-15 DIAGNOSIS — E1042 Type 1 diabetes mellitus with diabetic polyneuropathy: Secondary | ICD-10-CM

## 2021-08-15 MED ORDER — INSULIN ASPART 100 UNIT/ML IJ SOLN: INTRAMUSCULAR | 1 refills | Status: DC

## 2021-08-15 NOTE — Telephone Encounter (Signed)
Mother called saying patient is out of insulin and needs this today.

## 2021-08-15 NOTE — Telephone Encounter (Signed)
Insulin sent to pharmacy

## 2021-08-16 ENCOUNTER — Encounter (HOSPITAL_BASED_OUTPATIENT_CLINIC_OR_DEPARTMENT_OTHER): Payer: HMO | Admitting: General Surgery

## 2021-08-18 ENCOUNTER — Ambulatory Visit (INDEPENDENT_AMBULATORY_CARE_PROVIDER_SITE_OTHER): Payer: HMO | Admitting: Internal Medicine

## 2021-08-18 ENCOUNTER — Encounter: Payer: Self-pay | Admitting: Internal Medicine

## 2021-08-18 ENCOUNTER — Telehealth: Payer: Self-pay | Admitting: Internal Medicine

## 2021-08-18 ENCOUNTER — Other Ambulatory Visit: Payer: Self-pay

## 2021-08-18 VITALS — BP 126/72 | HR 41 | Ht 74.0 in | Wt 174.2 lb

## 2021-08-18 DIAGNOSIS — E1165 Type 2 diabetes mellitus with hyperglycemia: Secondary | ICD-10-CM

## 2021-08-18 DIAGNOSIS — E1042 Type 1 diabetes mellitus with diabetic polyneuropathy: Secondary | ICD-10-CM

## 2021-08-18 DIAGNOSIS — E1142 Type 2 diabetes mellitus with diabetic polyneuropathy: Secondary | ICD-10-CM | POA: Insufficient documentation

## 2021-08-18 DIAGNOSIS — E10628 Type 1 diabetes mellitus with other skin complications: Secondary | ICD-10-CM

## 2021-08-18 DIAGNOSIS — E113593 Type 2 diabetes mellitus with proliferative diabetic retinopathy without macular edema, bilateral: Secondary | ICD-10-CM

## 2021-08-18 DIAGNOSIS — E119 Type 2 diabetes mellitus without complications: Secondary | ICD-10-CM | POA: Insufficient documentation

## 2021-08-18 DIAGNOSIS — E1159 Type 2 diabetes mellitus with other circulatory complications: Secondary | ICD-10-CM | POA: Diagnosis not present

## 2021-08-18 DIAGNOSIS — E1169 Type 2 diabetes mellitus with other specified complication: Secondary | ICD-10-CM | POA: Insufficient documentation

## 2021-08-18 DIAGNOSIS — Z794 Long term (current) use of insulin: Secondary | ICD-10-CM | POA: Insufficient documentation

## 2021-08-18 LAB — POCT GLYCOSYLATED HEMOGLOBIN (HGB A1C): Hemoglobin A1C: 9.7 % — AB (ref 4.0–5.6)

## 2021-08-18 MED ORDER — DEXCOM G6 SENSOR MISC: 1.0000 | 3 refills | Status: DC

## 2021-08-18 MED ORDER — DEXCOM G6 TRANSMITTER MISC: 1 refills | Status: DC

## 2021-08-18 NOTE — Patient Instructions (Signed)
Please ENTER 5 grams for Carbohydrates with each meal Continue to enter 2-3 grams of carbohydrates with snacks   HOW TO TREAT LOW BLOOD SUGARS (Blood sugar LESS THAN 70 MG/DL) Please follow the RULE OF 15 for the treatment of hypoglycemia treatment (when your (blood sugars are less than 70 mg/dL)   STEP 1: Take 15 grams of carbohydrates when your blood sugar is low, which includes:  3-4 GLUCOSE TABS  OR 3-4 OZ OF JUICE OR REGULAR SODA OR ONE TUBE OF GLUCOSE GEL    STEP 2: RECHECK blood sugar in 15 MINUTES STEP 3: If your blood sugar is still low at the 15 minute recheck --> then, go back to STEP 1 and treat AGAIN with another 15 grams of carbohydrates.

## 2021-08-18 NOTE — Telephone Encounter (Signed)
Hi Linda,   Can you please check on this patient's CGM? When we went to download his omnipod there were no CGM data at all and when the CGM tried to be downloaded seperately, there was limited  data    Thanks

## 2021-08-18 NOTE — Progress Notes (Signed)
Name: [name] [name]  Age/ Sex: 66 y.o., male   MRN/ DOB: [id number], November 09, 1955     PCP: Fredirick Lathe, PA-C   Reason for Endocrinology Evaluation: Type 2 Diabetes Mellitus  Initial Endocrine Consultative Visit: 07/15/2020    PATIENT IDENTIFIER: [name] is a 66 y.o. male with a past medical history of DM, CAD, HTN, PVD, PAF. The patient has followed with Endocrinology clinic since 07/15/2020 for consultative assistance with management of his diabetes.  DIABETIC HISTORY:  [name] was diagnosed with DM in 2011, and started insulin therapy in 2013. His hemoglobin A1c has ranged from 9.2% in 2023, peaking at >15.0% in 2022.  Saw Dr. Loanne Drilling last in 04/2021 SUBJECTIVE:   During the last visit (04/20/2021): saw Dr. Loanne Drilling   Today (08/18/2021): [name] is here for a follow up on diabetes management.  He checks his blood sugars multiple  times daily. The patient has not had hypoglycemic episodes since the last clinic visit.   Denies nausea, vomiting  Has chronic diarrhea  Left eye sx pending    This patient with type 2 diabetes is treated with Omnipod  (insulin pump). During the visit the pump basal and bolus doses were reviewed including carb/insulin rations and supplemental doses. The clinical list was updated. The glucose meter download was reviewed in detail to determine if the current pump settings are providing the best glycemic control without excessive hypoglycemia.  Pump and meter download:    Pump   Omnipod Settings   Insulin type   Novolog    Basal rate       0000 0.5              I:C ratio       0000 1:1                  Sensitivity       0000  100      Goal       0000  120            Type & Model of Pump: Omnipod  Insulin Type: Currently using Novolog .    PUMP STATISTICS: Average BG: 264  Average Daily Carbs (g): 7.4 Average Total Daily Insulin: 13.1 Average Daily Basal: 8.3 (63 %) Average Daily Bolus: 4.8 (37  %)        HOME DIABETES REGIMEN:  Novolog   Statin: yes ACE-I/ARB: yes    CONTINUOUS GLUCOSE MONITORING RECORD INTERPRETATION    Dates of Recording: 8/1-8/14/2023  Sensor description:dexcom  Results statistics:   CGM use % of time 0  Average and SD 373/37  Time in range     0   %  % Time Above 180 0  % Time above 250 100  % Time Below target 0      Glycemic patterns summary: sporadic data with high glucose readings all day and night   Hyperglycemic episodes  all day ad night   Hypoglycemic episodes occurred n/a  Overnight periods: high      DIABETIC COMPLICATIONS: Microvascular complications:  Retinopathy B/L , blind in left eye  Denies: CKD Last Eye Exam: Completed 2023  Macrovascular complications:  CAD (S/P CABG) Denies: CVA,   HISTORY:  Past Medical History:  Past Medical History:  Diagnosis Date   Anxiety    Arthritis    Cataract    CHF (congestive heart failure) ([place])    Constipation    pt states goes EOD- hard stools  Coronary artery disease    Depression    Diabetes mellitus without complication ([place])    Diabetic retinopathy ([place])    DKA (diabetic ketoacidoses)    Hyperlipidemia    Hypertension    off meds   Hypertensive retinopathy    Postherpetic neuralgia 08/11/2021   Speculative diagnosis   Psoriasis    Tubular adenoma of colon 03/2019   Past Surgical History:  Past Surgical History:  Procedure Laterality Date   ABDOMINAL AORTOGRAM W/LOWER EXTREMITY N/A 08/30/2020   Procedure: ABDOMINAL AORTOGRAM W/LOWER EXTREMITY;  Surgeon: Serafina Mitchell, MD;  Location: [facility];  Service: Cardiovascular;  Laterality: N/A;   ABDOMINAL AORTOGRAM W/LOWER EXTREMITY N/A 11/15/2020   Procedure: ABDOMINAL AORTOGRAM W/LOWER EXTREMITY;  Surgeon: Serafina Mitchell, MD;  Location: [facility];  Service: Cardiovascular;  Laterality: N/A;   ANKLE SURGERY     right   CARDIAC CATHETERIZATION  09/25/2001   COLONOSCOPY     ~10  yr ago- normal  per pt    CORONARY ARTERY BYPASS GRAFT  09/30/2001   CABG x 4   IR THORACENTESIS ASP PLEURAL SPACE W/IMG GUIDE  05/19/2020   LAPAROSCOPIC INGUINAL HERNIA REPAIR Bilateral 02/09/2010   MASS EXCISION Left 03/12/2013   Procedure: LEFT INDEX EXCISION MASS AND DEBRIDMENT DISTAL INTERPHALANGEAL JOINT;  Surgeon: Tennis Must, MD;  Location: [facility];  Service: Orthopedics;  Laterality: Left;   PERIPHERAL VASCULAR BALLOON ANGIOPLASTY Left 08/30/2020   Procedure: PERIPHERAL VASCULAR BALLOON ANGIOPLASTY;  Surgeon: Serafina Mitchell, MD;  Location: [facility];  Service: Cardiovascular;  Laterality: Left;  Peroneal   Social History:  reports that he has been smoking cigars. He has never used smokeless tobacco. He reports current alcohol use. He reports that he does not use drugs. Family History:  Family History  Problem Relation Age of Onset   Lymphoma Father    Cancer Paternal Grandmother        Colon Cancer   Colon cancer Paternal Grandmother    Colon polyps Neg Hx    Esophageal cancer Neg Hx    Rectal cancer Neg Hx    Stomach cancer Neg Hx    Diabetes Mellitus I Neg Hx    Pancreatic cancer Neg Hx      HOME MEDICATIONS: Allergies as of 08/18/2021   No Known Allergies      Medication List        Accurate as of August 18, 2021 12:54 PM. If you have any questions, ask your nurse or doctor.          apixaban 5 MG Tabs tablet Commonly known as: Eliquis Take 1 tablet (5 mg total) by mouth 2 (two) times daily. PLEASE SCHEDULE APPT WITH [facility] FOR FURTHER REFILLS [phone]   aspirin EC 81 MG tablet Take 1 tablet (81 mg total) by mouth daily.   atorvastatin 40 MG tablet Commonly known as: LIPITOR Take 1 tablet (40 mg total) by mouth daily.   B COMPLEX-B12 PO Take 1 tablet by mouth daily.   BD Pen Needle Nano 2nd Gen 32G X 4 MM Misc Generic drug: Insulin Pen Needle USE FOR INSULIN   Dexcom G6 Sensor Misc 1 Device by Does not apply route  See admin instructions. Change every 10 days   Dexcom G6 Transmitter Misc Use as instructed to check blood sugar   Disposable Brief Large Misc 1 each by Does not apply route as needed.   furosemide 40 MG tablet Commonly known as:  LASIX Take 1 tablet (40 mg total) by mouth daily.   insulin aspart 100 UNIT/ML injection Commonly known as: novoLOG Inject 48 units a day in to pump. What changed: additional instructions   lidocaine 5 % ointment Commonly known as: XYLOCAINE Apply 1 Application topically as needed.   loperamide 2 MG tablet Commonly known as: Imodium A-D Take 1 tablet (2 mg total) by mouth 4 (four) times daily as needed for diarrhea or loose stools.   losartan 25 MG tablet Commonly known as: COZAAR Take 1 tablet (25 mg total) by mouth daily.   mirtazapine 15 MG tablet Commonly known as: Remeron Take 1 tablet (15 mg total) by mouth at bedtime.   Omnipod 5 G6 Intro (Gen 5) Kit 1 Device by Does not apply route every 3 (three) days.   Omnipod 5 G6 Pod (Gen 5) Misc APPLY EVERY 3 (THREE) DAYS.   OneTouch Delica Plus [id number] Misc Apply 1 each topically 4 (four) times daily.   OneTouch Verio Flex System w/Device Kit   OneTouch Verio test strip Generic drug: glucose blood 1 each 4 (four) times daily.   PARoxetine 20 MG tablet Commonly known as: PAXIL Take 1 tablet (20 mg total) by mouth daily.   spironolactone 25 MG tablet Commonly known as: ALDACTONE TAKE ONE TABLET BY MOUTH DAILY   Vitamin D 125 MCG (5000 UT) Caps Take 5,000 Units by mouth daily.         OBJECTIVE:   Vital Signs: BP 126/72 (BP Location: Left Arm, Patient Position: Sitting, Cuff Size: Normal)   Pulse (!) 41   Ht _0  (1.88 m)   Wt 174 lb 3.2 oz (79 kg)   SpO2 96%   BMI 22.37 kg/m   Wt Readings from Last 3 Encounters:  08/18/21 174 lb 3.2 oz (79 kg)  08/11/21 172 lb 6.4 oz (78.2 kg)  07/17/21 179 lb 10.8 oz (81.5 kg)     Exam: General: Pt appears well and is in NAD   Lungs: Clear with good BS bilat with no rales, rhonchi, or wheezes  Heart: RRR   Extremities: 1+ pretibial edema.   Neuro: MS is good with appropriate affect, pt is alert and Ox3       DATA REVIEWED:  Lab Results  Component Value Date   HGBA1C 9.7 (A) 08/18/2021   HGBA1C 9.2 (A) 04/20/2021   HGBA1C 7.1 (A) 01/12/2021   Lab Results  Component Value Date   MICROALBUR 40.8 (H) 03/16/2020   LDLCALC 34 02/16/2021   CREATININE 1.16 07/17/2021   Lab Results  Component Value Date   MICRALBCREAT 104.8 (H) 03/16/2020     Lab Results  Component Value Date   CHOL 78 02/16/2021   HDL 39 (L) 02/16/2021   LDLCALC 34 02/16/2021   LDLDIRECT 37.0 03/16/2020   TRIG 26 02/16/2021   CHOLHDL 2.0 02/16/2021         ASSESSMENT / PLAN / RECOMMENDATIONS:   1) Type 2 Diabetes Mellitus, Poorly controlled, With retinopathic , neuropathic, retinopathic and macrovascular  complications - Most recent A1c of 9.7 %. Goal A1c < 7.0 %.    -Patient continues with hyperglycemia -Upon downloading his pump today there was no glucose data, and note has been sent to our CDE to clarify why his Dexcom is not connecting to his OmniPod -I am going to increase his basal rate, and adjust his sensitivity factor as below -He was also advised to enter 5 g of carbohydrates with each meal, and between 2-3  g for snacks   MEDICATIONS: NovoLog     Pump   Omnipod Settings   Insulin type   Novolog    Basal rate       0000 0.7              I:C ratio       0000 1:1                  Sensitivity       0000  60      Goal       0000  120            EDUCATION / INSTRUCTIONS: BG monitoring instructions: Patient is instructed to check his blood sugars before times a day, before each meal and bedtime. Call [facility] if: BG persistently < 70  I reviewed the Rule of 15 for the treatment of hypoglycemia in detail with the patient. Literature supplied.     2) Diabetic  complications:  Eye: Does  have known diabetic retinopathy.  Neuro/ Feet: Does not have known diabetic peripheral neuropathy .  Renal: Patient does not have known baseline CKD. He   is  on an ACEI/ARB at present.       F/U in 4 months     Signed electronically by: Mack Guise, MD  [facility]  [facility] [address] [place], [place] [address] Phone: [phone] FAX: [phone]   CC: [name], [name], PA-C [address] [place] [address] Phone: [phone]  Fax: [phone]  Return to Endocrinology clinic as below: Future Appointments  Date Time Provider [place]  08/23/2021  7:45 AM Fredirick Maudlin, MD [facility]  09/08/2021  8:30 AM [name], MD [facility] None  10/18/2021  3:45 PM Gardiner Barefoot, DPM [facility]  11/14/2021 10:20 AM O'Neal, Cassie Freer, MD [facility]  01/09/2022 11:50 AM Ernesto Lashway, Melanie Crazier, MD [facility] None

## 2021-08-21 ENCOUNTER — Encounter: Payer: Self-pay | Admitting: Internal Medicine

## 2021-08-23 ENCOUNTER — Encounter (HOSPITAL_BASED_OUTPATIENT_CLINIC_OR_DEPARTMENT_OTHER): Payer: HMO | Admitting: General Surgery

## 2021-08-23 DIAGNOSIS — E11621 Type 2 diabetes mellitus with foot ulcer: Secondary | ICD-10-CM | POA: Diagnosis not present

## 2021-08-23 DIAGNOSIS — L97512 Non-pressure chronic ulcer of other part of right foot with fat layer exposed: Secondary | ICD-10-CM | POA: Diagnosis not present

## 2021-08-25 DIAGNOSIS — E109 Type 1 diabetes mellitus without complications: Secondary | ICD-10-CM | POA: Diagnosis not present

## 2021-08-25 DIAGNOSIS — Z9641 Presence of insulin pump (external) (internal): Secondary | ICD-10-CM | POA: Diagnosis not present

## 2021-08-28 NOTE — H&P (Signed)
[name] [name] is an 66 y.o. male.    Chief Complaint: Decreased vision, LEFT EYE  HPI: Pt with complex medical history that includes long history of uncontrolled diabetes. Pt presented with decreased vision OS and was found to have a dense vitreous hemorrhage and extensive vitreous traction OS secondary to proliferative diabetic retinopathy. After a discussion of the risks benefits and alternatives to surgery, the patient has elected to proceed with surgery to clear the vitreous hemorrhage and repair any retinal pathologies amenable to treatment: 25g PPV w/ membrane peel, endolaser and possible silicon oil vs gas, LEFT EYE, under general anesthesia.  Past Medical History:  Diagnosis Date   Anxiety    Arthritis    Cataract    CHF (congestive heart failure) ([place])    Constipation    pt states goes EOD- hard stools    Coronary artery disease    Depression    Diabetes mellitus without complication ([facility])    Diabetic retinopathy ([place])    DKA (diabetic ketoacidoses)    Hyperlipidemia    Hypertension    off meds   Hypertensive retinopathy    Postherpetic neuralgia 08/11/2021   Speculative diagnosis   Psoriasis    Tubular adenoma of colon 03/2019   Past Surgical History:  Procedure Laterality Date   ABDOMINAL AORTOGRAM W/LOWER EXTREMITY N/A 08/30/2020   Procedure: ABDOMINAL AORTOGRAM W/LOWER EXTREMITY;  Surgeon: Serafina Mitchell, MD;  Location: [facility];  Service: Cardiovascular;  Laterality: N/A;   ABDOMINAL AORTOGRAM W/LOWER EXTREMITY N/A 11/15/2020   Procedure: ABDOMINAL AORTOGRAM W/LOWER EXTREMITY;  Surgeon: Serafina Mitchell, MD;  Location: [facility];  Service: Cardiovascular;  Laterality: N/A;   ANKLE SURGERY     right   CARDIAC CATHETERIZATION  09/25/2001   COLONOSCOPY     ~10 yr ago- normal  per pt    CORONARY ARTERY BYPASS GRAFT  09/30/2001   CABG x 4   IR THORACENTESIS ASP PLEURAL SPACE W/IMG GUIDE  05/19/2020   LAPAROSCOPIC INGUINAL HERNIA REPAIR  Bilateral 02/09/2010   MASS EXCISION Left 03/12/2013   Procedure: LEFT INDEX EXCISION MASS AND DEBRIDMENT DISTAL INTERPHALANGEAL JOINT;  Surgeon: Tennis Must, MD;  Location: [facility];  Service: Orthopedics;  Laterality: Left;   PERIPHERAL VASCULAR BALLOON ANGIOPLASTY Left 08/30/2020   Procedure: PERIPHERAL VASCULAR BALLOON ANGIOPLASTY;  Surgeon: Serafina Mitchell, MD;  Location: [facility];  Service: Cardiovascular;  Laterality: Left;  Peroneal   Family History  Problem Relation Age of Onset   Lymphoma Father    Cancer Paternal Grandmother        Colon Cancer   Colon cancer Paternal Grandmother    Colon polyps Neg Hx    Esophageal cancer Neg Hx    Rectal cancer Neg Hx    Stomach cancer Neg Hx    Diabetes Mellitus I Neg Hx    Pancreatic cancer Neg Hx    Social History:  reports that he has been smoking cigars. He has never used smokeless tobacco. He reports current alcohol use. He reports that he does not use drugs.  Allergies: No Known Allergies  No medications prior to admission.    Review of systems otherwise negative  There were no vitals taken for this visit.  Physical exam: Mental status: oriented x3. Eyes: See eye exam associated with this date of surgery Ears, Nose, Throat: within normal limits Neck: Within Normal limits General: within normal limits Chest: Within normal limits Breast: deferred Heart: Within normal limits  Abdomen: Within normal limits GU: deferred Extremities: within normal limits Skin: within normal limits  Assessment/Plan Proliferative Diabetic Retinopathy with vitreous hemorrhage and vitreous traction, OS  Plan: To [facility] for 25g PPV w/ membrane peel, endolaser, and gas vs silicon oil, LEFT EYE, under general anesthesia. - case scheduled for Thursday, 09.07.23, 1130 am -- [name] OR 08 - preop w/ Anesthesia scheduled for Thursday, 08.31.23   [name], M.D., Ph.D. Vitreoretinal Surgeon [facility]

## 2021-08-28 NOTE — Progress Notes (Signed)
[name], [name] ([id number]) Visit Report for 08/23/2021 Arrival Information Details Patient Name: Date of Service: [name] [name] [facility] THA N L. 08/23/2021 7:45 A M Medical Record Number: [id number] Patient Account Number: [id number] Date of Birth/Sex: Treating RN: [birth year] (66 y.o. [name] Primary Care Britzy Graul: [name], Alyssa Other Clinician: Referring Eviana Sibilia: Treating Danaya Geddis/Extender: Orland Jarred in Treatment: 80 Visit Information History Since Last Visit All ordered tests and consults were completed: Yes Patient Arrived: Walker Added or deleted any medications: No Arrival Time: 07:49 Any new allergies or adverse reactions: No Accompanied By: spouse Had a fall or experienced change in No Transfer Assistance: None activities of daily living that may affect Patient Identification Verified: Yes risk of falls: Secondary Verification Process Completed: Yes Signs or symptoms of abuse/neglect since last visito No Patient Requires Transmission-Based Precautions: No Hospitalized since last visit: No Patient Has Alerts: Yes Implantable device outside of the clinic excluding No Patient Alerts: Patient on Blood Thinner cellular tissue based products placed in the center since last visit: Has Dressing in Place as Prescribed: Yes Pain Present Now: Yes Electronic Signature(s) Signed: 08/25/2021 7:35:53 AM By: [name] RN Entered By: [name] on 08/23/2021 07:49:43 -------------------------------------------------------------------------------- Encounter Discharge Information Details Patient Name: Date of Service: [name] [name] [facility] THA N L. 08/23/2021 7:45 A M Medical Record Number: [id number] Patient Account Number: [id number] Date of Birth/Sex: Treating RN: 04-Nov-1955 (66 y.o. [name] Primary Care Ludy Messamore: [name], Alyssa Other Clinician: Referring Susano Cleckler: Treating Sharnette Kitamura/Extender: Thana Ates, Alyssa Weeks  in Treatment: 80 Encounter Discharge Information Items Post Procedure Vitals Discharge Condition: Stable Temperature (F): 98.0 Ambulatory Status: Walker Pulse (bpm): 57 Discharge Destination: Home Respiratory Rate (breaths/min): 18 Transportation: Private Auto Blood Pressure (mmHg): 172/80 Accompanied By: family member Schedule Follow-up Appointment: No Clinical Summary of Care: Electronic Signature(s) Signed: 08/25/2021 7:35:53 AM By: [name] RN Entered By: [name] on 08/23/2021 08:16:57 -------------------------------------------------------------------------------- Lower Extremity Assessment Details Patient Name: Date of Service: [name] [name] [facility] THA N L. 08/23/2021 7:45 A M Medical Record Number: [id number] Patient Account Number: [id number] Date of Birth/Sex: Treating RN: 04-12-1955 (66 y.o. [name] Primary Care Journe Hallmark: [name], Alyssa Other Clinician: Referring Estephany Perot: Treating Darcey Cardy/Extender: Fredirick Maudlin [name], Alyssa Weeks in Treatment: 58 Edema Assessment Assessed: [Left: No] [Right: No] Edema: [Left: Ye] [Right: s] Calf Left: Right: Point of Measurement: 34 cm From Medial Instep 33 cm Ankle Left: Right: Point of Measurement: 10 cm From Medial Instep 23 cm Vascular Assessment Pulses: Dorsalis Pedis Palpable: [Right:Yes] Electronic Signature(s) Signed: 08/25/2021 7:35:53 AM By: [name] RN Entered By: [name] on 08/23/2021 07:53:58 -------------------------------------------------------------------------------- Multi Wound Chart Details Patient Name: Date of Service: [name] [name] [facility] THA N L. 08/23/2021 7:45 A M Medical Record Number: [id number] Patient Account Number: [id number] Date of Birth/Sex: Treating RN: January 13, 1955 (66 y.o. [name] Primary Care Kimbrely Buckel: [name], Alyssa Other Clinician: Referring Avian Konigsberg: Treating Emojean Gertz/Extender: Fredirick Maudlin [name], Alyssa Weeks in Treatment:  28 Vital Signs Height(in): 74 Pulse(bpm): 71 Weight(lbs): 151 Blood Pressure(mmHg): 172/80 Body Mass Index(BMI): 19.4 Temperature(F): 98.0 Respiratory Rate(breaths/min): 18 Photos: [N/A:N/A] Right, Lateral Foot N/A N/A Wound Location: Gradually Appeared N/A N/A Wounding Event: Diabetic Wound/Ulcer of the Lower N/A N/A Primary Etiology: Extremity Coronary Artery Disease, N/A N/A Comorbid History: Hypertension, Type II Diabetes, Osteoarthritis, Neuropathy 06/21/2020 N/A N/A Date Acquired: 61 N/A N/A Weeks of Treatment: Open N/A N/A Wound Status: No N/A N/A Wound Recurrence: 0.2x0.2x0.1 N/A N/A Measurements L x W x D (cm) 0.031  N/A N/A A (cm) : rea 0.003 N/A N/A Volume (cm) : 98.50% N/A N/A % Reduction in A rea: 99.30% N/A N/A % Reduction in Volume: Grade 2 N/A N/A Classification: Medium N/A N/A Exudate A mount: Purulent N/A N/A Exudate Type: yellow, brown, green N/A N/A Exudate Color: Flat and Intact N/A N/A Wound Margin: Large (67-100%) N/A N/A Granulation A mount: Red, Pink N/A N/A Granulation Quality: Small (1-33%) N/A N/A Necrotic A mount: Fat Layer (Subcutaneous Tissue): Yes N/A N/A Exposed Structures: Fascia: No Tendon: No Muscle: No Joint: No Bone: No Medium (34-66%) N/A N/A Epithelialization: Debridement - Selective/Open Wound N/A N/A Debridement: Pre-procedure Verification/Time Out 08:05 N/A N/A Taken: Lidocaine 5% topical ointment N/A N/A Pain Control: Necrotic/Eschar N/A N/A Tissue Debrided: Non-Viable Tissue N/A N/A Level: 0.16 N/A N/A Debridement A (sq cm): rea Curette N/A N/A Instrument: Minimum N/A N/A Bleeding: Pressure N/A N/A Hemostasis A chieved: 0 N/A N/A Procedural Pain: 0 N/A N/A Post Procedural Pain: Procedure was tolerated well N/A N/A Debridement Treatment Response: 0.2x0.2x0.1 N/A N/A Post Debridement Measurements L x W x D (cm) 0.003 N/A N/A Post Debridement Volume: (cm) Debridement N/A  N/A Procedures Performed: Treatment Notes Electronic Signature(s) Signed: 08/23/2021 8:11:00 AM By: Fredirick Maudlin MD FACS Signed: 08/28/2021 5:02:03 PM By: Adline Peals Entered By: Fredirick Maudlin on 08/23/2021 08:11:00 -------------------------------------------------------------------------------- Multi-Disciplinary Care Plan Details Patient Name: Date of Service: [name] [name] [facility] THA N L. 08/23/2021 7:45 A M Medical Record Number: [id number] Patient Account Number: [id number] Date of Birth/Sex: Treating RN: [birth year] (66 y.o. [name] Primary Care Virat Prather: [name], Alyssa Other Clinician: Referring Jadie Comas: Treating Altagracia Rone/Extender: Orland Jarred in Treatment: 78 [place] reviewed with physician Active Inactive Electronic Signature(s) Signed: 08/25/2021 7:35:53 AM By: [name] RN Entered By: [name] on 08/23/2021 08:15:45 -------------------------------------------------------------------------------- Pain Assessment Details Patient Name: Date of Service: [name] [name] [facility] THA N L. 08/23/2021 7:45 A M Medical Record Number: [id number] Patient Account Number: [id number] Date of Birth/Sex: Treating RN: [birth year] (66 y.o. [name] Primary Care Larhonda Dettloff: [name], Alyssa Other Clinician: Referring Mariane Burpee: Treating Pryce Folts/Extender: Thana Ates, Alyssa Weeks in Treatment: 75 Active Problems Location of Pain Severity and Description of Pain Patient Has Paino Yes Site Locations Pain Location: Generalized Pain Rate the pain. Current Pain Level: 10 Pain Management and Medication Current Pain Management: Electronic Signature(s) Signed: 08/25/2021 7:35:53 AM By: [name] RN Entered By: [name] on 08/23/2021 07:51:09 -------------------------------------------------------------------------------- Patient/Caregiver Education Details Patient Name: Date of Service: [name] [name] [facility] THA N Carlean Jews 8/23/2023andnbsp7:45 A M Medical Record Number: [id number] Patient Account Number: [id number] Date of Birth/Gender: Treating RN: [birth year] (66 y.o. [name] Primary Care Physician: [name], Alyssa Other Clinician: Referring Physician: Treating Physician/Extender: Orland Jarred in Treatment: 36 Education Assessment Education Provided To: Patient Education Topics Provided Wound/Skin Impairment: Methods: Explain/Verbal Responses: Reinforcements needed, State content correctly Electronic Signature(s) Signed: 08/25/2021 7:35:53 AM By: [name] RN Entered By: [name] on 08/23/2021 08:14:31 -------------------------------------------------------------------------------- Wound Assessment Details Patient Name: Date of Service: [name] [name] [facility] THA N L. 08/23/2021 7:45 A M Medical Record Number: [id number] Patient Account Number: [id number] Date of Birth/Sex: Treating RN: Jan 03, 1955 (66 y.o. [name] Primary Care Chrystine Frogge: [name], Alyssa Other Clinician: Referring Kaidance Pantoja: Treating Jawon Dipiero/Extender: Fredirick Maudlin [name], Alyssa Weeks in Treatment: 42 Wound Status Wound Number: 3 Primary Diabetic Wound/Ulcer of the Lower Extremity Etiology: Wound Location: Right, Lateral Foot Wound Open Wounding Event: Gradually Appeared Status: Date Acquired: 06/21/2020  Comorbid Coronary Artery Disease, Hypertension, Type II Diabetes, Weeks Of Treatment: 58 History: Osteoarthritis, Neuropathy Clustered Wound: No Photos Wound Measurements Length: (cm) Width: (cm) Depth: (cm) Area: (cm) Volume: (cm) 0 % Reduction in Area: 100% 0 % Reduction in Volume: 100% 0 Epithelialization: Medium (34-66%) 0 Tunneling: No 0 Undermining: No Wound Description Classification: Grade 2 Wound Margin: Flat and Intact Exudate Amount: None Present Foul Odor After Cleansing: No Slough/Fibrino No Wound Bed Granulation Amount:  Large (67-100%) Exposed Structure Necrotic Amount: None Present (0%) Fascia Exposed: No Fat Layer (Subcutaneous Tissue) Exposed: No Tendon Exposed: No Muscle Exposed: No Joint Exposed: No Bone Exposed: No Electronic Signature(s) Signed: 08/25/2021 7:35:53 AM By: [name] RN Entered By: [name] on 08/23/2021 08:15:17 -------------------------------------------------------------------------------- Vitals Details Patient Name: Date of Service: [name] [name] [facility] THA N L. 08/23/2021 7:45 A M Medical Record Number: [id number] Patient Account Number: [id number] Date of Birth/Sex: Treating RN: 1955-04-27 (66 y.o. [name] Primary Care Caliegh Middlekauff: [name], Alyssa Other Clinician: Referring Nicholous Girgenti: Treating Destani Wamser/Extender: Fredirick Maudlin [name], Alyssa Weeks in Treatment: 55 Vital Signs Time Taken: 07:50 Temperature (F): 98.0 Height (in): 74 Pulse (bpm): 57 Weight (lbs): 151 Respiratory Rate (breaths/min): 18 Body Mass Index (BMI): 19.4 Blood Pressure (mmHg): 172/80 Reference Range: 80 - 120 mg / dl Electronic Signature(s) Signed: 08/25/2021 7:35:53 AM By: [name] RN Entered By: [name] on 08/23/2021 07:50:56

## 2021-08-28 NOTE — Progress Notes (Signed)
[name] [name] ([id number]) Visit Report for 08/23/2021 Chief Complaint Document Details Patient Name: Date of Service: [name] [name] THA N L. 08/23/2021 7:45 A M Medical Record Number: [id number] Patient Account Number: [id number] Date of Birth/Sex: Treating RN: 1955/08/31 (66 y.o. [name] [name] Primary Care Provider: [name], [name] Other Clinician: Referring Provider: Treating Provider/Extender: [name] [name] in Treatment: 68 Information Obtained from: Patient Chief Complaint 07/07/2020; patient is in clinic for today for review of pressure ulcers on his coccyx and left heel and diabetic foot ulcers bilaterally. Electronic Signature(s) Signed: 08/23/2021 8:11:06 AM By: [name] Maudlin MD FACS Entered By: [name] [name] on 08/23/2021 08:11:06 -------------------------------------------------------------------------------- Debridement Details Patient Name: Date of Service: [name] [name] [facility] THA N L. 08/23/2021 7:45 A M Medical Record Number: [id number] Patient Account Number: [id number] Date of Birth/Sex: Treating RN: 1955/03/20 (66 y.o. [name] [name] Primary Care Provider: [name], [name] Other Clinician: Referring Provider: Treating Provider/Extender: [name] [name] in Treatment: 68 Debridement Performed for Assessment: Wound #3 Right,Lateral Foot Performed By: Physician [name] Maudlin, MD Debridement Type: Debridement Severity of Tissue Pre Debridement: Fat layer exposed Level of Consciousness (Pre-procedure): Awake and Alert Pre-procedure Verification/Time Out Yes - 08:05 Taken: Start Time: 08:06 Pain Control: Lidocaine 5% topical ointment T Area Debrided (L x W): otal 0.4 (cm) x 0.4 (cm) = 0.16 (cm) Tissue and other material debrided: Non-Viable, Eschar Level: Non-Viable Tissue Debridement Description: Selective/Open Wound Instrument: Curette Bleeding: Minimum Hemostasis Achieved: Pressure Procedural  Pain: 0 Post Procedural Pain: 0 Response to Treatment: Procedure was tolerated well Level of Consciousness (Post- Awake and Alert procedure): Post Debridement Measurements of Total Wound Length: (cm) 0.2 Width: (cm) 0.2 Depth: (cm) 0.1 Volume: (cm) 0.003 Character of Wound/Ulcer Post Debridement: Improved Severity of Tissue Post Debridement: Fat layer exposed Post Procedure Diagnosis Same as Pre-procedure Electronic Signature(s) Signed: 08/23/2021 10:08:14 AM By: [name] Maudlin MD FACS Signed: 08/25/2021 7:35:53 AM By: [name] East RN Entered By: [name] [name] on 08/23/2021 08:08:59 -------------------------------------------------------------------------------- HPI Details Patient Name: Date of Service: [name] [name] [facility] THA N L. 08/23/2021 7:45 A M Medical Record Number: [id number] Patient Account Number: [id number] Date of Birth/Sex: Treating RN: [birth year] (66 y.o. [name] [name] Primary Care Provider: [name], [name] Other Clinician: Referring Provider: Treating Provider/Extender: [name] [name] in Treatment: 68 History of Present Illness HPI Description: ADMISSION 07/07/2020 This is a 66 year old man who is a type II diabetic. I have reviewed this in epic. It appeared that his diabetes was diagnosed in 2009 at which time he was on metformin. He was followed for a period of time by an endocrinologist with [facility] to always labeled him as a type II diabetic. Recently in epic there is been a switch to type 1 diabetes. If there were defining antibody test done I do not see them and I think the totality of the evidence would suggest that he is a type II diabetic. His recent problem started in May when he fell down 17 stairs. He suffered a scapular fracture he was hospitalized at [facility] for 9 days and then discharged to [facility]. His mother who is present said that most of these wounds seem to develop surrounding the discharge from hospital or the  stay at [facility]. He has an area on his lower sacrum which appears currently to be a stage II but per their description this is actually improved quite a bit. He has an area on the left lateral foot at roughly the base of  the fifth metatarsal, the left Achilles heel and the right fifth metatarsal head laterally they have been using topical antibiotics. Seen by primary care yesterday and placed on Keflex The patient had arterial studies on 05/05/2020. I wondered whether this had something to do with wounds on his feet at that time but I have not been able to determine this. At that time his ABI on the right was 1.09 with a TBI of 0.65 on the left 0.88 with a TBI of 0.44. Waveforms were triphasic on the right monophasic on the left. He likely has significant PAD on the left Past medical history includes type 2 diabetes poorly controlled last hemoglobin A1c at 12.7 in May, hypertension, hyperlipidemia, atrial fib on Eliquis, coronary artery disease status post CABG, left scapular fracture in May of this year, AAA repair. Bilateral leg weakness ABIs were not done in our clinic as they were just done in [facility] this [facility]; patient arrives back in clinic he has areas on the left Achilles heel, base of the left fifth metatarsal, right lateral fifth metatarsal. The area he had over the coccyx appears to be healed but this area remains vulnerable. We have been using Iodoflex. I rereviewed his ABIs which were done in May. I do not know exactly what prompted these test however they do show a TBI of 0.44 on the left and monophasic waveforms. 7/21; the patient's wounds are about the same. Certainly no healing and the nurses noted some green drainage on the right fifth metatarsal head, left Achilles a little larger. We have been using silver alginate really without any improvement. We did get an appointment with vascular surgery however its that it did the end of August and they seem to  want to repeat the noninvasive test that they have already done in May. I am not sure what the issue here is. We advised the patient's sister to call and point to the idea that they have already had noninvasive studies. The real question I have is does this patient need an angiogram. He is a diabetic and it is possible that the ABIs are falsely elevated because of medial calcification. At least on the left his TBI was abnormal 08/08/2020 upon evaluation today patient appears to be doing about the same in regard to his wounds. He still waiting the appointment with vascular. This is something that hopefully should be undertaken shortly. The 29th of this month is when he is actually scheduled although they have put in a call to move this up if at all possible. Fortunately there does not appear to be any signs of active infection at this time. No fevers, chills, nausea, vomiting, or diarrhea. 8/18; appointment with Dr. Trula Slade on 8/29. The patient has wounds on the left Achilles heel, left lateral foot and the right lateral fifth metatarsal head. We have been using Santyl under Hydrofera Blue. The surface of especially the left heel looks better to me than what I remember. 9/1; since the patient was last here he underwent angiography by Dr. Trula Slade he also underwent noninvasive studies first showing a ABI of of 0.64 with monophasic waveforms. His TBI was 0.35 with a left great toe pressure of 46 this is about the same pattern as we saw previously. The left was a lot worse than the right. He underwent an angiogram on 08/29/2020. He had multiple 80% peroneal artery stenosis which was his dominant vessel which was treated with angioplasty with no residual stenosis. His  right posterior tibial artery was occluded. The anterior tibial artery was also occluded. An attempt was made at recanalization of the anterior tibial artery however this was unsuccessful. Noted that the peroneal artery reconstitutes the posterior  tibial artery and the anterior tibial artery creating the plantar arch. The patient has areas on the Achilles left heel left lateral foot and right lateral fifth metatarsal head we have been using Santyl and Hydrofera Blue 9/22; since the patient was last here he went for repeat vascular studies. On the right his ABI was 0.94 with biphasic waveforms TBI of 0.73 with a great toe pressure of 75. On the left which is the problematic wound his ABI was 1.08 but with biphasic waveforms at the PTA. Biphasic waveforms at the dorsalis pedis his TBI was 0.63 with a great toe pressure of 65. On the left however this does represent a fairly marked improvement. We have been using Santyl and Hydrofera Blue 9/29 left Achilles area. Still is having necrotic surface. Bleeds very easily as the patient is on a combination of Eliquis aspirin and Plavix just stopped yesterday. I am not going to do any debridement until next week. We are using Santyl and Hydrofera Blue. His arterial status on the right was actually quite good. The area on the fifth metatarsal head needs additional debridement. A total contact cast in this area is not out of the question. He does describe claudication I believe with minimal activity. He is not very active X-ray we ordered last week is still pending in terms of the report 10/6 left Achilles area. In terms of the necrotic surface this is quite a bit better. There is still some debris in the posterior part of the wound however this looks better. Anteriorly the wound appears to be epithelialized there is still a divot in the middle of this but this does not probe to deep tissue. The area on the right fifth metatarsal head laterally really is not making as much progress as I would have liked. Still not completely a viable surface. Both the patient and his wife agree that he is not an external rotator in terms of ambulation [an outie]. We have been using Santyl and Hydrofera Blue for a prolonged  period. On the left Achilles which looked like it is worse wound this seems to be making some progress not so much on the right foot. The patient and his wife state that they are rigorous about offloading both of these areas He was treated with angioplasty as I believe on the right peroneal artery that had multiple stenosis. This is a dominant vessel the anterior tibial and posterior tibial arteries are occluded however the foot is fed via collaterals from the peroneal. From my notes I am not able to understand the vascular supply on the left I will have to review this. 10/13; left Achilles top part of this looks healthy granulation in the bottom part of this wound again completely necrotic requiring debridement. The area on the right lateral fifth met head also requires debridement. The majority of this is epithelialized however he she there is a refractory part to this. 10/20; left Achilles 100% better looking wound surface. Necrotic upper 50% seems to have resolved at least by inspection under illumination. Culture I did of this area last week postdebridement showed Proteus and Citrobacter. Both sensitive to quinolones and I put him on ciprofloxacin. We have been using silver alginate on the wound on the heel and Santyl and Hydrofera Blue on the  right fifth met head. 10/27; left Achilles is measuring much smaller we are using topical gentamicin under silver alginate. Right fifth met head still fibrinous debris on the surface we are using Santyl and Hydrofera Blue. Patient lost his brother suddenly last week he was unable to keep his vascular surgery appointment. 11/3; left Achilles not as viable surface as I might like. Some hypergranulation. Right fifth metatarsal head still completely nonviable surface. It turns out that she was not using topical gentamicin on the left heel [mother] they have been using Santyl and Hydrofera Blue on the right fifth metatarsal head. The patient did have  noninvasive vascular studies on 10/ 28/22. He had triphasic waveforms up to the level of the proximal PTA and then a 50 to 74% stenosis in the mid PTA. Peroneal artery was triphasic as was the anterior tibial. He follows up with Dr. Trula Slade on 11/7. 11/17; since the patient was last here he saw Dr. Trula Slade and went for ANGIOGRAPHY on 11/15/2020. He has wounds on the left Achilles heel and the right lateral fifth metatarsal head. He has known PAD and had previous revascularization in August. On this occasion on the right his right common femoral, profundofemoral and superficial femoral and popliteal arteries were widely patent the dominant vessel is the posterior tibial which had several areas of stenosis greater than 80% he went underwent angioplasty in this area. He had no interventions on the left however he is left common femoral, profundofemoral and superficial femoral arteries were widely patent as well as the popliteal he has recurrent stenosis greater than 50% within the peroneal artery which is a single- vessel runoff. The thought was that he may require further angiography and reangioplasty on the left peroneal artery. Overall the patient has severe PAD he has undergone angioplasty of the right posterior tibial artery. He arrives in clinic today with the Achilles heel area on the left surprisingly better with marked improvement in the surface area on the right not nearly as good but stable 12/8; patient has follow-up arterial Doppler studies [facility] He is now being revascularized on both sides. Fortunately on the right Achilles area this is just about closed slight scabbing/eschar. I did not remove this today. The area on the right fifth met head looked better after last week's debridement we have been using Santyl and Hydrofera Blue 01/05/21 the patient has not been here in almost a month. He was supposed to have follow-up Doppler studies on 12/19 look these up. He has  been revascularized on both sides The left Achilles area is closed he does not need to dress this but will need to protect this area on an ongoing basis likely indefinitely. He still has the area on the right fifth plantar metatarsal head. The area is measuring smaller he has a rim of epithelialization. The granulation tissue still a bit gelatinous for my liking 1/26; The left Achilles remains closed. Plantar right fifth metatarsal head is smaller and appears to be healthy. He has not followed up with vascular surgery and does not seem particularly interested. He did not get his follow-up arterial Dopplers. I explained to him that angioplasty of the small arteries in his leg is a limb saving procedure but possibility of restenosis is reasonably high and he should follow-up with follow-up noninvasive studies. Frankly he did not seem interested today 03/30/2021: The patient has not been seen here since the end of January. He never followed up with vascular surgery and today  states that he refuses to be seen at [facility] due to his dissatisfaction with the fact that his arteries seem to restenose. The wound on his left Achilles is still closed. The plantar right fifth metatarsal head is quite a bit bigger than it was at his last visit. He is accompanied by his mother who reports that it had gotten down to a very small size but then she went out of town for a week and during that time it enlarged to its current dimensions. The patient is wearing the functional equivalent of house slippers for shoes and is not making any effort to offload. He refuses to wear a forefoot offloading sandal due to instability on his feet and says that when a total contact cast was attempted, it was "a total disaster" and he is unwilling to consider this again. He has been in PolyMem silver since at least the beginning of January. 04/26/2021: The patient is here today after about a month. He has been using Hydrofera Blue to his wound  and today it is markedly improved. It is small and clean. He is scheduled to see Dr. Gwenlyn Found in interventional cardiology tomorrow. 05/24/2021: Once again, the patient returns after about a month. I had referred him to Dr. Gwenlyn Found and the patient walked out from that appointment. Apparently there was some confusion over why he was seeing the cardiologist for his peripheral arterial disease. He saw his primary cardiologist, Dr. Davina Poke, last week and has been re-referred to see Dr. Gwenlyn Found, the appointment is tomorrow. Basically the wound is a little bit bigger today but clean without any slough accumulation. The periwound callus that has accumulated is completely macerated. I thought he had been in [facility], but the dressing that had been removed and discarded today looks more like PolyMem Ag. 05/31/2021: The patient decided not to see Dr. Gwenlyn Found because he feels like anytime he has a vascular intervention, the arteries just closed up again and he does not want to do this anymore. He does not really grasp the nature of the disease process. The wound as a bit smaller today and very clean. No accumulation of periwound callus. We have been using Hydrofera Blue. 06/07/2021: The wound looks good today. It is a little bit smaller without any significant periwound callus accumulation. The surface is clean with good granulation tissue. 06/20/2021: The patient's mother was out of town last week and so he could not come to clinic. He did have help with his wound care during her absence. Today, the wound looks very good. It is a little bit smaller. There is some slough accumulation on the surface. 06/27/2021: The wound is about the same size today, but the surface is fairly clean. There is some dead skin around the perimeter, but no real callus accumulation. Thin layer of slough. 07/11/2021: The wound is now flush with the surrounding skin. It has contracted considerably. There is just a light layer of slough and  biofilm on the surface. 07/26/2021: The patient was absent from clinic last week due to illness. Today, the skin around the wound appears to have gotten macerated and the wound is larger, as a result. It remains flush with the surrounding skin and has a nice beefy surface. 08/09/2021: The wound is quite a bit smaller today but does have some undermining and a light layer of callus. The intake nurse noted some purulent material coming from the wound when she remove the dressing, but did not find any additional pockets to express. On  my evaluation, the surface is clean and the wound does not appear to be grossly infected. 08/23/2021: His wound has healed. Electronic Signature(s) Signed: 08/23/2021 8:11:33 AM By: [name] Maudlin MD FACS Entered By: [name] [name] on 08/23/2021 08:11:33 -------------------------------------------------------------------------------- Physical Exam Details Patient Name: Date of Service: [name] [name] [facility] THA N L. 08/23/2021 7:45 A M Medical Record Number: [id number] Patient Account Number: [id number] Date of Birth/Sex: Treating RN: October 24, 1955 (66 y.o. [name] [name] Primary Care Provider: [name], [name] Other Clinician: Referring Provider: Treating Provider/Extender: [name] [name] [name] [name] [name] in Treatment: 14 Constitutional Hypertensive, asymptomatic. Slightly bradycardic, asymptomatic.. . . No acute distress.Marland Kitchen Respiratory Normal work of breathing on room air.. Notes 08/23/2021: His wound has healed. Electronic Signature(s) Signed: 08/23/2021 8:12:12 AM By: [name] Maudlin MD FACS Entered By: [name] [name] on 08/23/2021 08:12:11 -------------------------------------------------------------------------------- Physician Orders Details Patient Name: Date of Service: [name] [name] [facility]NA THA N L. 08/23/2021 7:45 A M Medical Record Number: [id number] Patient Account Number: [id number] Date of Birth/Sex: Treating RN: 1955/11/16 (66 y.o. [name] [name] Primary Care Provider: [name], [name] Other Clinician: Referring Provider: Treating Provider/Extender: [name] [name] in Treatment: 11 Verbal / Phone Orders: No Diagnosis Coding ICD-10 Coding Code Description L97.511 Non-pressure chronic ulcer of other part of right foot limited to breakdown of skin E11.621 Type 2 diabetes mellitus with foot ulcer E11.51 Type 2 diabetes mellitus with diabetic peripheral angiopathy without gangrene E11.42 Type 2 diabetes mellitus with diabetic polyneuropathy Discharge From [facility] Discharge from [place]! Anesthetic Wound #3 Right,Lateral Foot (In clinic) Topical Lidocaine 5% applied to wound bed Bathing/ Shower/ Hygiene May shower and wash wound with soap and water. - with dressing changes only. Edema Control - Lymphedema / SCD / Other Elevate legs to the level of the heart or above for 30 minutes daily and/or when sitting, a frequency of: - 3-4 times a day throughout the day. Moisturize legs daily. - every night before bed. *** closely monitor feet every night before bed. Off-Loading Other: - float heels while resting in bed or chair. Use a pillow or rolled sheet under legs to offload the heels. Ensure no pressure to right lateral foot. Wound Treatment Electronic Signature(s) Signed: 08/23/2021 10:08:14 AM By: [name] Maudlin MD FACS Entered By: [name] [name] on 08/23/2021 08:12:19 -------------------------------------------------------------------------------- Problem List Details Patient Name: Date of Service: [name] [name] [facility]NA THA N L. 08/23/2021 7:45 A M Medical Record Number: [id number] Patient Account Number: [id number] Date of Birth/Sex: Treating RN: [birth year] (66 y.o. [name] [name] Primary Care Provider: [name], [name] Other Clinician: Referring Provider: Treating Provider/Extender: [name] [name] in Treatment: 11 Active  Problems ICD-10 Encounter Code Description Active Date MDM Diagnosis L97.511 Non-pressure chronic ulcer of other part of right foot limited to breakdown of 07/07/2020 No Yes skin E11.621 Type 2 diabetes mellitus with foot ulcer 07/07/2020 No Yes E11.51 Type 2 diabetes mellitus with diabetic peripheral angiopathy without gangrene 09/01/2020 No Yes E11.42 Type 2 diabetes mellitus with diabetic polyneuropathy 07/07/2020 No Yes Inactive Problems ICD-10 Code Description Active Date Inactive Date L97.528 Non-pressure chronic ulcer of other part of left foot with other specified severity 07/07/2020 07/07/2020 L89.152 Pressure ulcer of sacral region, stage 2 07/07/2020 07/07/2020 N23.557 Pressure ulcer of left heel, stage 2 07/07/2020 07/07/2020 Resolved Problems Electronic Signature(s) Signed: 08/23/2021 8:09:51 AM By: [name] Maudlin MD FACS Entered By: [name] [name] on 08/23/2021 08:09:51 -------------------------------------------------------------------------------- Progress Note Details Patient Name: Date of Service: [name] [name] [facility] THA N L.  08/23/2021 7:45 A M Medical Record Number: [id number] Patient Account Number: [id number] Date of Birth/Sex: Treating RN: 03/07/1955 (66 y.o. [name] [name] Primary Care Provider: [name], [name] Other Clinician: Referring Provider: Treating Provider/Extender: [name] [name] in Treatment: 71 Subjective Chief Complaint Information obtained from Patient 07/07/2020; patient is in clinic for today for review of pressure ulcers on his coccyx and left heel and diabetic foot ulcers bilaterally. History of Present Illness (HPI) ADMISSION 07/07/2020 This is a 66 year old man who is a type II diabetic. I have reviewed this in epic. It appeared that his diabetes was diagnosed in 2009 at which time he was on metformin. He was followed for a period of time by an endocrinologist with [facility] to always labeled him as a type II diabetic. Recently in  epic there is been a switch to type 1 diabetes. If there were defining antibody test done I do not see them and I think the totality of the evidence would suggest that he is a type II diabetic. His recent problem started in May when he fell down 17 stairs. He suffered a scapular fracture he was hospitalized at [facility] for 9 days and then discharged to [facility]. His mother who is present said that most of these wounds seem to develop surrounding the discharge from hospital or the stay at [facility]. He has an area on his lower sacrum which appears currently to be a stage II but per their description this is actually improved quite a bit. He has an area on the left lateral foot at roughly the base of the fifth metatarsal, the left Achilles heel and the right fifth metatarsal head laterally they have been using topical antibiotics. Seen by primary care yesterday and placed on Keflex The patient had arterial studies on 05/05/2020. I wondered whether this had something to do with wounds on his feet at that time but I have not been able to determine this. At that time his ABI on the right was 1.09 with a TBI of 0.65 on the left 0.88 with a TBI of 0.44. Waveforms were triphasic on the right monophasic on the left. He likely has significant PAD on the left Past medical history includes type 2 diabetes poorly controlled last hemoglobin A1c at 12.7 in May, hypertension, hyperlipidemia, atrial fib on Eliquis, coronary artery disease status post CABG, left scapular fracture in May of this year, AAA repair. Bilateral leg weakness ABIs were not done in our clinic as they were just [facility]; patient arrives back in clinic he has areas on the left Achilles heel, base of the left fifth metatarsal, right lateral fifth metatarsal. The area he had over the coccyx appears to be healed but this area remains vulnerable. We have been using Iodoflex. I rereviewed his ABIs which were  done in May. I do not know exactly what prompted these test however they do show a TBI of 0.44 on the left and monophasic waveforms. 7/21; the patient's wounds are about the same. Certainly no healing and the nurses noted some green drainage on the right fifth metatarsal head, left Achilles a little larger. We have been using silver alginate really without any improvement. We did get an appointment with vascular surgery however its that it did the end of August and they seem to want to repeat the noninvasive test that they have already done in May. I am not sure what the issue here is. We advised  the patient's sister to call and point to the idea that they have already had noninvasive studies. The real question I have is does this patient need an angiogram. He is a diabetic and it is possible that the ABIs are falsely elevated because of medial calcification. At least on the left his TBI was abnormal 08/08/2020 upon evaluation today patient appears to be doing about the same in regard to his wounds. He still waiting the appointment with vascular. This is something that hopefully should be undertaken shortly. The 29th of this month is when he is actually scheduled although they have put in a call to move this up if at all possible. Fortunately there does not appear to be any signs of active infection at this time. No fevers, chills, nausea, vomiting, or diarrhea. 8/18; appointment with Dr. Trula Slade on 8/29. The patient has wounds on the left Achilles heel, left lateral foot and the right lateral fifth metatarsal head. We have been using Santyl under Hydrofera Blue. The surface of especially the left heel looks better to me than what I remember. 9/1; since the patient was last here he underwent angiography by Dr. Trula Slade he also underwent noninvasive studies first showing a ABI of of 0.64 with monophasic waveforms. His TBI was 0.35 with a left great toe pressure of 46 this is about the same pattern as we  saw previously. The left was a lot worse than the right. He underwent an angiogram on 08/29/2020. He had multiple 80% peroneal artery stenosis which was his dominant vessel which was treated with angioplasty with no residual stenosis. 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 The anterior tibial artery was also occluded. An attempt was made at recanalization of the anterior tibial artery however this was unsuccessful. Noted that the peroneal artery reconstitutes the posterior tibial artery and the anterior tibial artery creating the plantar arch. The patient has areas on the Achilles left heel left lateral foot and right lateral fifth metatarsal head we have been using Santyl and Hydrofera Blue 9/22; since the patient was last here he went for repeat vascular studies. On the right his ABI was 0.94 with biphasic waveforms TBI of 0.73 with a great toe pressure of 75. On the left which is the problematic wound his ABI was 1.08 but with biphasic waveforms at the PTA. Biphasic waveforms at the dorsalis pedis his TBI was 0.63 with a great toe pressure of 65. On the left however this does represent a fairly marked improvement. We have been using Santyl and Hydrofera Blue 9/29 left Achilles area. Still is having necrotic surface. Bleeds very easily as the patient is on a combination of Eliquis aspirin and Plavix just stopped yesterday. I am not going to do any debridement until next week. We are using Santyl and Hydrofera Blue. His arterial status on the right was actually quite good. The area on the fifth metatarsal head needs additional debridement. A total contact cast in this area is not out of the question. He does describe claudication I believe with minimal activity. He is not very active X-ray we ordered last week is still pending in terms of the report 10/6 left Achilles area. In terms of the necrotic surface this is quite a bit better. There is still some debris in the posterior part of the  wound however this looks better. Anteriorly the wound appears to be epithelialized there is still a divot in the middle of this but this does not probe to deep tissue. The area  on the right fifth metatarsal head laterally really is not making as much progress as I would have liked. Still not completely a viable surface. Both the patient and his wife agree that he is not an external rotator in terms of ambulation [an outie]. We have been using Santyl and Hydrofera Blue for a prolonged period. On the left Achilles which looked like it is worse wound this seems to be making some progress not so much on the right foot. The patient and his wife state that they are rigorous about offloading both of these areas He was treated with angioplasty as I believe on the right peroneal artery that had multiple stenosis. This is a dominant vessel the anterior tibial and posterior tibial arteries are occluded however the foot is fed via collaterals from the peroneal. From my notes I am not able to understand the vascular supply on the left I will have to review this. 10/13; left Achilles top part of this looks healthy granulation in the bottom part of this wound again completely necrotic requiring debridement. oo The area on the right lateral fifth met head also requires debridement. The majority of this is epithelialized however he she there is a refractory part to this. 10/20; left Achilles 100% better looking wound surface. Necrotic upper 50% seems to have resolved at least by inspection under illumination. Culture I did of this area last week postdebridement showed Proteus and Citrobacter. Both sensitive to quinolones and I put him on ciprofloxacin. We have been using silver alginate on the wound on the heel and Santyl and Hydrofera Blue on the right fifth met head. 10/27; left Achilles is measuring much smaller we are using topical gentamicin under silver alginate. Right fifth met head still fibrinous debris on  the surface we are using Santyl and Hydrofera Blue. Patient lost his brother suddenly last week he was unable to keep his vascular surgery appointment. 11/3; left Achilles not as viable surface as I might like. Some hypergranulation. Right fifth metatarsal head still completely nonviable surface. It turns out that she was not using topical gentamicin on the left heel [mother] they have been using Santyl and Hydrofera Blue on the right fifth metatarsal head. The patient did have noninvasive vascular studies on 10/ 28/22. He had triphasic waveforms up to the level of the proximal PTA and then a 50 to 74% stenosis in the mid PTA. Peroneal artery was triphasic as was the anterior tibial. He follows up with Dr. Trula Slade on 11/7. 11/17; since the patient was last here he saw Dr. Trula Slade and went for ANGIOGRAPHY on 11/15/2020. He has wounds on the left Achilles heel and the right lateral fifth metatarsal head. He has known PAD and had previous revascularization in August. On this occasion on the right his right common femoral, profundofemoral and superficial femoral and popliteal arteries were widely patent the dominant vessel is the posterior tibial which had several areas of stenosis greater than 80% he went underwent angioplasty in this area. He had no interventions on the left however he is left common femoral, profundofemoral and superficial femoral arteries were widely patent as well as the popliteal he has recurrent stenosis greater than 50% within the peroneal artery which is a single- vessel runoff. The thought was that he may require further angiography and reangioplasty on the left peroneal artery. Overall the patient has severe PAD he has undergone angioplasty of the right posterior tibial artery. He arrives in clinic today with the Achilles heel area on the left  surprisingly better with marked improvement in the surface area on the right not nearly as good but stable 12/8; patient has follow-up  arterial Doppler [facility] He is now being revascularized on both sides. Fortunately on the right Achilles area this is just about closed slight scabbing/eschar. I did not remove this today. The area on the right fifth met head looked better after last week's debridement we have been using Santyl and Hydrofera Blue 01/05/21 the patient has not been here in almost a month. He was supposed to have follow-up Doppler studies on 12/19 look these up. He has been revascularized on both sides The left Achilles area is closed he does not need to dress this but will need to protect this area on an ongoing basis likely indefinitely. He still has the area on the right fifth plantar metatarsal head. The area is measuring smaller he has a rim of epithelialization. The granulation tissue still a bit gelatinous for my liking 1/26; The left Achilles remains closed. Plantar right fifth metatarsal head is smaller and appears to be healthy. He has not followed up with vascular surgery and does not seem particularly interested. He did not get his follow-up arterial Dopplers. I explained to him that angioplasty of the small arteries in his leg is a limb saving procedure but possibility of restenosis is reasonably high and he should follow-up with follow-up noninvasive studies. Frankly he did not seem interested today 03/30/2021: The patient has not been seen here since the end of January. He never followed up with vascular surgery and today states that he refuses to be seen at [facility] due to his dissatisfaction with the fact that his arteries seem to restenose. The wound on his left Achilles is still closed. The plantar right fifth metatarsal head is quite a bit bigger than it was at his last visit. He is accompanied by his mother who reports that it had gotten down to a very small size but then she went out of town for a week and during that time it enlarged to its current dimensions. The patient  is wearing the functional equivalent of house slippers for shoes and is not making any effort to offload. He refuses to wear a forefoot offloading sandal due to instability on his feet and says that when a total contact cast was attempted, it was "a total disaster" and he is unwilling to consider this again. He has been in PolyMem silver since at least the beginning of January. 04/26/2021: The patient is here today after about a month. He has been using Hydrofera Blue to his wound and today it is markedly improved. It is small and clean. He is scheduled to see Dr. Gwenlyn Found in interventional cardiology tomorrow. 05/24/2021: Once again, the patient returns after about a month. I had referred him to Dr. Gwenlyn Found and the patient walked out from that appointment. Apparently there was some confusion over why he was seeing the cardiologist for his peripheral arterial disease. He saw his primary cardiologist, Dr. Davina Poke, last week and has been re-referred to see Dr. Gwenlyn Found, the appointment is tomorrow. Basically the wound is a little bit bigger today but clean without any slough accumulation. The periwound callus that has accumulated is completely macerated. I thought he had been in [facility], but the dressing that had been removed and discarded today looks more like PolyMem Ag. 05/31/2021: The patient decided not to see Dr. Gwenlyn Found because he feels like anytime he has a  vascular intervention, the arteries just closed up again and he does not want to do this anymore. He does not really grasp the nature of the disease process. The wound as a bit smaller today and very clean. No accumulation of periwound callus. We have been using Hydrofera Blue. 06/07/2021: The wound looks good today. It is a little bit smaller without any significant periwound callus accumulation. The surface is clean with good granulation tissue. 06/20/2021: The patient's mother was out of town last week and so he could not come to clinic. He did  have help with his wound care during her absence. Today, the wound looks very good. It is a little bit smaller. There is some slough accumulation on the surface. 06/27/2021: The wound is about the same size today, but the surface is fairly clean. There is some dead skin around the perimeter, but no real callus accumulation. Thin layer of slough. 07/11/2021: The wound is now flush with the surrounding skin. It has contracted considerably. There is just a light layer of slough and biofilm on the surface. 07/26/2021: The patient was absent from clinic last week due to illness. Today, the skin around the wound appears to have gotten macerated and the wound is larger, as a result. It remains flush with the surrounding skin and has a nice beefy surface. 08/09/2021: The wound is quite a bit smaller today but does have some undermining and a light layer of callus. The intake nurse noted some purulent material coming from the wound when she remove the dressing, but did not find any additional pockets to express. On my evaluation, the surface is clean and the wound does not appear to be grossly infected. 08/23/2021: His wound has healed. Patient History Information obtained from Patient. Family History Cancer - Paternal Grandparents,Father, Heart Disease - Father, Hypertension - Father, No family history of Diabetes, Hereditary Spherocytosis, Kidney Disease, Lung Disease, Seizures, Stroke, Thyroid Problems, Tuberculosis. Social History Current some day smoker - Landscape architect, Marital Status - Divorced, Alcohol Use - Rarely, Drug Use - No History, Caffeine Use - Daily. Medical History Cardiovascular Patient has history of Coronary Artery Disease, Hypertension Endocrine Patient has history of Type II Diabetes Musculoskeletal Patient has history of Osteoarthritis Neurologic Patient has history of Neuropathy Objective Constitutional Hypertensive, asymptomatic. Slightly bradycardic, asymptomatic.Marland Kitchen No acute  distress.. Vitals Time Taken: 7:50 AM, Height: 74 in, Weight: 151 lbs, BMI: 19.4, Temperature: 98.0 F, Pulse: 57 bpm, Respiratory Rate: 18 breaths/min, Blood Pressure: 172/80 mmHg. Respiratory Normal work of breathing on room air.. General Notes: 08/23/2021: His wound has healed. Integumentary (Hair, Skin) Wound #3 status is Open. Original cause of wound was Gradually Appeared. The date acquired was: 06/21/2020. The wound has been in treatment 58 [name]. The wound is located on the Right,Lateral Foot. The wound measures 0.2cm length x 0.2cm width x 0.1cm depth; 0.031cm^2 area and 0.003cm^3 volume. There is Fat Layer (Subcutaneous Tissue) exposed. There is no tunneling or undermining noted. There is a medium amount of purulent drainage noted. The wound margin is flat and intact. There is large (67-100%) red, pink granulation within the wound bed. There is a small (1-33%) amount of necrotic tissue within the wound bed including Adherent Slough. Assessment Active Problems ICD-10 Non-pressure chronic ulcer of other part of right foot limited to breakdown of skin Type 2 diabetes mellitus with foot ulcer Type 2 diabetes mellitus with diabetic peripheral angiopathy without gangrene Type 2 diabetes mellitus with diabetic polyneuropathy Procedures Wound #3 Pre-procedure diagnosis of Wound #3 is a  Diabetic Wound/Ulcer of the Lower Extremity located on the Right,Lateral Foot .Severity of Tissue Pre Debridement is: Fat layer exposed. There was a Selective/Open Wound Non-Viable Tissue Debridement with a total area of 0.16 sq cm performed by [name] Maudlin, MD. With the following instrument(s): Curette to remove Non-Viable tissue/material. Material removed includes Eschar after achieving pain control using Lidocaine 5% topical ointment. No specimens were taken. A time out was conducted at 08:05, prior to the start of the procedure. A Minimum amount of bleeding was controlled with Pressure. The procedure  was tolerated well with a pain level of 0 throughout and a pain level of 0 following the procedure. Post Debridement Measurements: 0.2cm length x 0.2cm width x 0.1cm depth; 0.003cm^3 volume. Character of Wound/Ulcer Post Debridement is improved. Severity of Tissue Post Debridement is: Fat layer exposed. Post procedure Diagnosis Wound #3: Same as Pre-Procedure Plan Discharge From [facility] Services: Discharge from [place]! Anesthetic: Wound #3 Right,Lateral Foot: (In clinic) Topical Lidocaine 5% applied to wound bed Bathing/ Shower/ Hygiene: May shower and wash wound with soap and water. - with dressing changes only. Edema Control - Lymphedema / SCD / Other: Elevate legs to the level of the heart or above for 30 minutes daily and/or when sitting, a frequency of: - 3-4 times a day throughout the day. Moisturize legs daily. - every night before bed. *** closely monitor feet every night before bed. Off-Loading: Other: - float heels while resting in bed or chair. Use a pillow or rolled sheet under legs to offload the heels. Ensure no pressure to right lateral foot. 08/23/2021: I used a curette to debride eschar overlying the wound site and once this was removed, it was revealed that his wound has healed. He was cautioned to avoid heavy activity, pressure, or friction to the site for the next week or 2 to avoid reopening the freshly healed site. He was reminded that as a diabetic, he needs to wear supportive, protective footwear. We will discharge him from the wound care center. Follow-up as needed. Electronic Signature(s) Signed: 08/23/2021 8:13:41 AM By: [name] Maudlin MD FACS Previous Signature: 08/23/2021 8:13:05 AM Version By: [name] Maudlin MD FACS Entered By: [name] [name] on 08/23/2021 08:13:40 -------------------------------------------------------------------------------- HxROS Details Patient Name: Date of Service: [name] [name] [facility] THA N L. 08/23/2021 7:45 A  M Medical Record Number: [id number] Patient Account Number: [id number] Date of Birth/Sex: Treating RN: Aug 29, 1955 (66 y.o. [name] [name] Primary Care Provider: [name], [name] Other Clinician: Referring Provider: Treating Provider/Extender: [name] [name] in Treatment: 49 Information Obtained From Patient Cardiovascular Medical History: Positive for: Coronary Artery Disease; Hypertension Endocrine Medical History: Positive for: Type II Diabetes Treated with: Insulin Blood sugar tested every day: Yes Tested : CGM Musculoskeletal Medical History: Positive for: Osteoarthritis Neurologic Medical History: Positive for: Neuropathy Immunizations Pneumococcal Vaccine: Received Pneumococcal Vaccination: Yes Received Pneumococcal Vaccination On or After 60th Birthday: Yes Implantable Devices None Family and Social History Cancer: Yes - Paternal Grandparents,Father; Diabetes: No; Heart Disease: Yes - Father; Hereditary Spherocytosis: No; Hypertension: Yes - Father; Kidney Disease: No; Lung Disease: No; Seizures: No; Stroke: No; Thyroid Problems: No; Tuberculosis: No; Current some day smoker - Cigars; Marital Status - Divorced; Alcohol Use: Rarely; Drug Use: No History; Caffeine Use: Daily; Financial Concerns: No; Food, Clothing or Shelter Needs: No; Support System Lacking: No; Transportation Concerns: No Electronic Signature(s) Signed: 08/23/2021 10:08:14 AM By: [name] Maudlin MD FACS Signed: 08/28/2021 5:02:03 PM By: Adline Peals Entered By: [name] [name] on 08/23/2021  08:11:38 -------------------------------------------------------------------------------- SuperBill Details Patient Name: Date of Service: [name] [name] [facility] THA N L. 08/23/2021 Medical Record Number: [id number] Patient Account Number: [id number] Date of Birth/Sex: Treating RN: February 24, 1955 (66 y.o. [name] [name] Primary Care Provider: [name], [name] Other  Clinician: Referring Provider: Treating Provider/Extender: [name] [name] in Treatment: 73 Diagnosis Coding ICD-10 Codes Code Description L97.511 Non-pressure chronic ulcer of other part of right foot limited to breakdown of skin E11.621 Type 2 diabetes mellitus with foot ulcer E11.51 Type 2 diabetes mellitus with diabetic peripheral angiopathy without gangrene E11.42 Type 2 diabetes mellitus with diabetic polyneuropathy Facility Procedures CPT4 Code: [id number] Description: [id number] - DEBRIDE WOUND 1ST 20 SQ CM OR < ICD-10 Diagnosis Description L97.511 Non-pressure chronic ulcer of other part of right foot limited to breakdown of s Modifier: kin Quantity: 1 Physician Procedures : CPT4 Code Description Modifier [id number] - WC PHYS LEVEL 3 - EST PT 25 ICD-10 Diagnosis Description L97.511 Non-pressure chronic ulcer of other part of right foot limited to breakdown of skin E11.621 Type 2 diabetes mellitus with foot ulcer E11.42  Type 2 diabetes mellitus with diabetic polyneuropathy E11.51 Type 2 diabetes mellitus with diabetic peripheral angiopathy without gangrene Quantity: 1 : [id number] - WC PHYS DEBR WO ANESTH 20 SQ CM ICD-10 Diagnosis Description L97.511 Non-pressure chronic ulcer of other part of right foot limited to breakdown of skin Quantity: 1 Electronic Signature(s) Signed: 08/23/2021 8:14:02 AM By: [name] Maudlin MD FACS Entered By: [name] [name] on 08/23/2021 08:14:01

## 2021-08-29 ENCOUNTER — Other Ambulatory Visit: Payer: Self-pay | Admitting: Physician Assistant

## 2021-08-31 ENCOUNTER — Encounter (INDEPENDENT_AMBULATORY_CARE_PROVIDER_SITE_OTHER): Payer: HMO | Admitting: Ophthalmology

## 2021-08-31 ENCOUNTER — Other Ambulatory Visit: Payer: Self-pay | Admitting: Endocrinology

## 2021-08-31 ENCOUNTER — Other Ambulatory Visit (HOSPITAL_COMMUNITY): Payer: HMO

## 2021-08-31 ENCOUNTER — Encounter (INDEPENDENT_AMBULATORY_CARE_PROVIDER_SITE_OTHER): Payer: PPO | Admitting: Ophthalmology

## 2021-08-31 DIAGNOSIS — E1165 Type 2 diabetes mellitus with hyperglycemia: Secondary | ICD-10-CM

## 2021-08-31 NOTE — Progress Notes (Signed)
[facility] Note  09/08/2021     CHIEF COMPLAINT Patient presents for Retina Follow Up  HISTORY OF PRESENT ILLNESS: [name] [name] is a 66 y.o. male who presents to the clinic today for:   HPI     Retina Follow Up   Patient presents with  Diabetic Retinopathy.  In both eyes.  This started years ago.  Duration of 4 weeks.  I, the attending physician,  performed the HPI with the patient and updated documentation appropriately.        Comments   Patient feels that the left eye keeps improving. He states that he is able to make out some things with his left eye and colors are more vivid. His blood sugar was 182 and his A1C is 8.?      Last edited by [name], MD on 09/08/2021 11:44 PM.     Referring physician: [name], [name], PA-C [id number] [place],  [place] [address]  HISTORICAL INFORMATION:   Selected notes from the MEDICAL RECORD NUMBER Referred by Dr. [name] for PDR OU   CURRENT MEDICATIONS: No current outpatient medications on file. (Ophthalmic Drugs)   No current facility-administered medications for this visit. (Ophthalmic Drugs)   Current Outpatient Medications (Other)  Medication Sig   apixaban (ELIQUIS) 5 MG TABS tablet Take 1 tablet (5 mg total) by mouth 2 (two) times daily. PLEASE SCHEDULE APPT WITH ALYSSA FOR FURTHER REFILLS [phone]   aspirin EC 81 MG EC tablet Take 1 tablet (81 mg total) by mouth daily.   atorvastatin (LIPITOR) 40 MG tablet Take 1 tablet (40 mg total) by mouth daily.   B Complex Vitamins (B COMPLEX-B12 PO) Take 1 tablet by mouth daily.    BD PEN NEEDLE NANO 2ND GEN 32G X 4 MM MISC USE FOR INSULIN   Blood Glucose Monitoring Suppl (ONETOUCH VERIO FLEX SYSTEM) w/Device KIT    Cholecalciferol (VITAMIN D) 125 MCG (5000 UT) CAPS Take 5,000 Units by mouth daily.    Continuous Blood Gluc Sensor (DEXCOM G6 SENSOR) MISC 1 Device by Does not apply route See admin instructions. Change every 10  days   Continuous Blood Gluc Transmit (DEXCOM G6 TRANSMITTER) MISC Use as instructed to check blood sugar   furosemide (LASIX) 40 MG tablet TAKE 1 TABLET BY MOUTH EVERY DAY   Incontinence Supply Disposable (DISPOSABLE BRIEF LARGE) MISC 1 each by Does not apply route as needed.   insulin aspart (NOVOLOG) 100 UNIT/ML injection Inject 48 units a day in to pump. (Patient taking differently: Inject 50 units a day in to pump.)   Insulin Disposable Pump (OMNIPOD 5 G6 INTRO, GEN 5,) KIT 1 Device by Does not apply route every 3 (three) days.   Insulin Disposable Pump (OMNIPOD 5 G6 POD, GEN 5,) MISC APPLY EVERY 3 (THREE) DAYS.   Lancets (ONETOUCH DELICA PLUS [id number]) MISC Apply 1 each topically 4 (four) times daily.   lidocaine (XYLOCAINE) 5 % ointment Apply 1 Application topically as needed.   loperamide (IMODIUM A-D) 2 MG tablet Take 1 tablet (2 mg total) by mouth 4 (four) times daily as needed for diarrhea or loose stools.   losartan (COZAAR) 25 MG tablet Take 1 tablet (25 mg total) by mouth daily.   mirtazapine (REMERON) 15 MG tablet Take 1 tablet (15 mg total) by mouth at bedtime.   ONETOUCH VERIO test strip 1 each 4 (four) times daily.   PARoxetine (PAXIL) 20 MG tablet Take 1 tablet (20  mg total) by mouth daily.   spironolactone (ALDACTONE) 25 MG tablet TAKE ONE TABLET BY MOUTH DAILY   Current Facility-Administered Medications (Other)  Medication Route   diclofenac Sodium (VOLTAREN) 1 % topical gel 2 g Topical   REVIEW OF SYSTEMS: ROS   Positive for: Neurological, Skin, Musculoskeletal, Endocrine, Cardiovascular, Eyes Negative for: Constitutional, Gastrointestinal, Genitourinary, HENT, Respiratory, Psychiatric, Allergic/Imm, Heme/Lymph Last edited by Annie Paras, COT on 09/08/2021  8:32 AM.     ALLERGIES No Known Allergies  PAST MEDICAL HISTORY Past Medical History:  Diagnosis Date   Anxiety    Arthritis    Cataract    CHF (congestive heart failure) ([place])    Constipation     pt states goes EOD- hard stools    Coronary artery disease    Depression    Diabetes mellitus without complication (HCC)    Diabetic retinopathy ([place])    DKA (diabetic ketoacidoses)    Hyperlipidemia    Hypertension    off meds   Hypertensive retinopathy    Postherpetic neuralgia 08/11/2021   Speculative diagnosis   Psoriasis    Tubular adenoma of colon 03/2019   Past Surgical History:  Procedure Laterality Date   ABDOMINAL AORTOGRAM W/LOWER EXTREMITY N/A 08/30/2020   Procedure: ABDOMINAL AORTOGRAM W/LOWER EXTREMITY;  Surgeon: Serafina Mitchell, MD;  Location: [facility];  Service: Cardiovascular;  Laterality: N/A;   ABDOMINAL AORTOGRAM W/LOWER EXTREMITY N/A 11/15/2020   Procedure: ABDOMINAL AORTOGRAM W/LOWER EXTREMITY;  Surgeon: Serafina Mitchell, MD;  Location: [facility];  Service: Cardiovascular;  Laterality: N/A;   ANKLE SURGERY     right   CARDIAC CATHETERIZATION  09/25/2001   COLONOSCOPY     ~10 yr ago- normal  per pt    CORONARY ARTERY BYPASS GRAFT  09/30/2001   CABG x 4   IR THORACENTESIS ASP PLEURAL SPACE W/IMG GUIDE  05/19/2020   LAPAROSCOPIC INGUINAL HERNIA REPAIR Bilateral 02/09/2010   MASS EXCISION Left 03/12/2013   Procedure: LEFT INDEX EXCISION MASS AND DEBRIDMENT DISTAL INTERPHALANGEAL JOINT;  Surgeon: Tennis Must, MD;  Location: [facility];  Service: Orthopedics;  Laterality: Left;   PERIPHERAL VASCULAR BALLOON ANGIOPLASTY Left 08/30/2020   Procedure: PERIPHERAL VASCULAR BALLOON ANGIOPLASTY;  Surgeon: Serafina Mitchell, MD;  Location: [facility];  Service: Cardiovascular;  Laterality: Left;  Peroneal   FAMILY HISTORY Family History  Problem Relation Age of Onset   Lymphoma Father    Cancer Paternal Grandmother        Colon Cancer   Colon cancer Paternal Grandmother    Colon polyps Neg Hx    Esophageal cancer Neg Hx    Rectal cancer Neg Hx    Stomach cancer Neg Hx    Diabetes Mellitus I Neg Hx    Pancreatic cancer Neg Hx     SOCIAL HISTORY Social History   Tobacco Use   Smoking status: Some Days    Types: Cigars   Smokeless tobacco: Never  Vaping Use   Vaping Use: Never used  Substance Use Topics   Alcohol use: Yes    Comment: daily beer or scotch    Drug use: No       OPHTHALMIC EXAM: Base Eye Exam     Visual Acuity (Snellen - Linear)       Right Left   Dist [place] 20/80 CF at 3'   Dist ph [place] 20/30 +1 NI         Tonometry (Tonopen, 8:36 AM)  Right Left   Pressure 10 8         Pupils       Dark Light Shape React APD   Right 4 3 Round Minimal None   Left 4 3 Round Minimal None         Visual Fields       Left Right   Restrictions Partial outer superior nasal, inferior nasal deficiencies          Extraocular Movement       Right Left    Full, Ortho Full, Ortho         Neuro/Psych     Oriented x3: Yes   Mood/Affect: Normal         Dilation     Both eyes: 1.0% Mydriacyl, 2.5% Phenylephrine @ 8:32 AM           Slit Lamp and Fundus Exam     External Exam       Right Left   External Normal Normal         Slit Lamp Exam       Right Left   Lids/Lashes Dermatochalasis - upper lid, Meibomian gland dysfunction Dermatochalasis - upper lid, Meibomian gland dysfunction   Conjunctiva/Sclera White and quiet White and quiet   Cornea arcus, 2-3+ Punctate epithelial erosions arcus, 2+ Punctate epithelial erosions   Anterior Chamber Deep and quiet Deep and quiet   Iris Round and dilated, No NVI Round and dilated, No NVI   Lens 2-3+ Nuclear sclerosis with brunescence, 2+ Cortical cataract 2-3+ Nuclear sclerosis with brunescence, 2-3+ Cortical cataract   Anterior Vitreous Vitreous syneresis, blood stained vitreous condensations persistent Vitreous syneresis, diffuse VH - improved, blood stained vitreous condensations, pre-retinal heme collecting inferiorly and now white -- all improving         Fundus Exam       Right Left   Disc Pink and Sharp, NVD -  regressed hazy view, Compact, fibrosis, mild Pallor   C/D Ratio 0.2 not visible   Macula Flat, good foveal reflex, scatterd MA/DBH, +cystic changes/edema temporal fovea and macula -- stably improved hazy view w/ residual VH and fibrosis, +traction   Vessels +NVE just inside ST arcades - improved, +fibrosis, attenuated, Tortuous Severe attenuation, Tortuous, +NVD; NVE along ST arcades - regressing   Periphery Attached; scattered DBH, good 360 PRP with room for fill in Hazy view, scattered fibrosis, grossly attached scattered PRP visible, scattered MA/DBH           Refraction     Wearing Rx       Sphere Cylinder Axis Add   Right +0.25 +2.00 180 +2.50   Left +0.50 +1.75 009 +2.50         Manifest Refraction       Sphere Cylinder Axis Dist VA Add   Right +1.00 +2.00 180 20/30 +3.00   Left +0.50 +1.75 009  +3.00           IMAGING AND PROCEDURES  Imaging and Procedures for 09/08/2021  OCT, Retina - OU - Both Eyes       Right Eye Quality was borderline. Central Foveal Thickness: 243. Progression has been stable. Findings include no SRF, abnormal foveal contour, intraretinal hyper-reflective material, intraretinal fluid, vitreous traction, preretinal fibrosis (Partial PVD, persistent cystic changes temporal macula and fovea, persistent PRF with traction).   Left Eye Quality was poor. Central Foveal Thickness: 202. Progression has improved. Findings include no SRF, abnormal foveal contour, intraretinal hyper-reflective material, intraretinal fluid, vitreous  traction (Interval improvement in severe vitreous opacities, persistent vitreous traction greatest along proximal arcades and disc).   Notes *Images captured and stored on drive  Diagnosis / Impression:  PDR with DME OU OD: Partial PVD, Partial PVD, persistent cystic changes temporal macula and fovea, persistent PRF with traction OS: Interval improvement in vitreous opacities, persistent vitreous traction greatest along  proximal arcades and disc  Clinical management:  See below  Abbreviations: NFP - Normal foveal profile. CME - cystoid macular edema. PED - pigment epithelial detachment. IRF - intraretinal fluid. SRF - subretinal fluid. EZ - ellipsoid zone. ERM - epiretinal membrane. ORA - outer retinal atrophy. ORT - outer retinal tubulation. SRHM - subretinal hyper-reflective material. IRHM - intraretinal hyper-reflective material      Intravitreal Injection, Pharmacologic Agent - OD - Right Eye       Time Out 09/08/2021. 9:54 AM. Confirmed correct patient, procedure, site, and patient consented.   Anesthesia Topical anesthesia was used. Anesthetic medications included Lidocaine 2%, Proparacaine 0.5%.   Procedure Preparation included 5% betadine to ocular surface, eyelid speculum. A supplied (32g) needle was used.   Injection: 1.25 mg Bevacizumab 1.70m/0.05ml   Route: Intravitreal, Site: Right Eye   NDC:: 40981-191-47 Lot: [id number] , Expiration date: 10/03/2021   Post-op Post injection exam found visual acuity of at least counting fingers. The patient tolerated the procedure well. There were no complications. The patient received written and verbal post procedure care education. Post injection medications were not given.      Intravitreal Injection, Pharmacologic Agent - OS - Left Eye       Time Out 09/08/2021. 9:55 AM. Confirmed correct patient, procedure, site, and patient consented.   Anesthesia Topical anesthesia was used. Anesthetic medications included Lidocaine 2%, Proparacaine 0.5%.   Procedure Preparation included 5% betadine to ocular surface, eyelid speculum. A (32 g) needle was used.   Injection: 1.25 mg Bevacizumab 1.265m0.05ml   Route: Intravitreal, Site: Left Eye   NDC: 30mLot: : [id number] date: 10/24/2021   Post-op Post injection exam found visual acuity of at least counting fingers. The patient tolerated the procedure well. There were no  complications. The patient received written and verbal post procedure care education. Post injection medications were not given.            ASSESSMENT/PLAN:    ICD-10-CM   1. Proliferative diabetic retinopathy of both eyes with macular edema associated with type 2 diabetes mellitus (HCC)  E11.3513 OCT, Retina - OU - Both Eyes    Intravitreal Injection, Pharmacologic Agent - OD - Right Eye    Intravitreal Injection, Pharmacologic Agent - OS - Left Eye    Bevacizumab (AVASTIN) SOLN 1.25 mg    Bevacizumab (AVASTIN) SOLN 1.25 mg    2. Vitreous hemorrhage of left eye (HCC)  H43.12     3. Essential hypertension  I10     4. Hypertensive retinopathy of both eyes  H35.033     5. Combined forms of age-related cataract of both eyes  H25.813      1. Severe proliferative diabetic retinopathy, both eyes  - last A1c 9.2 on 4.20.23; A1c >15% on 10.11.22 - s/p PRP OS (11.10.22)  - s/p PRP OD (12.02.22)  - s/p IVA OS #1 (01.18.23) for VH, #2 (03.17.23), #3 (04.14.23), #4 (05.12.23), #5 (06.09.23), #6 (07.07.23), #7 (08.10.23)  - s/p IVA OD #1 (01.27.23) for macular edema/NVD, #2 (03.17.23), #3 (04.14.23), #4 (05.12.23), #5 (06.09.23), #6 (07.07.23), #7 (08.10.23) - pt reports improved BG control - exam shows  extensive neovascularization, fibrosis and hemorrhage OU (OS >>> OD)  - OD with improved NVD / VH - OS with improved VH - BCVA 20/30 OD - improved, CF 3' OS - FA (11.09.22) shows areas of vascular non-perfusion OU, +NVD OU and +NVE OU - OCT shows OD: Partial PVD, Partial PVD, persistent cystic changes temporal macula and fovea, persistent PRF with traction; OS: Interval improvement in vitreous opacities, persistent vitreous traction greatest along proximal arcades and disc - discussed findings, severity of his disease, prognosis and likely need for extensive treatments -- PRP OU, multiple rounds of anti-VEGF therapy OU and possible surgery - recommend IVA OU #8 today, 09.08.23 for PDR OU -  pt wishes to proceed with injections - RBA of procedure discussed, questions answered - informed consent obtained and signed, 01.18.23 - see procedure note - F/u 4 weeks, DFE, OCT  2. Vitreous hemorrhage OS  - acute increase in VH OS (late Dec 2022) per pt report  - s/p IVA OS as above  - VH remains diffuse and now with increasing vitreous traction  - recommend PPV w/ membrane peel OS under general anesthesia - pt wishes to f/u in 4 weeks and discuss surgery again at that point  3,4. Hypertensive retinopathy OU - discussed importance of tight BP control - monitor   5. Mixed Cataract OU - The symptoms of cataract, surgical options, and treatments and risks were discussed with patient - discussed diagnosis and progression - monitor   Ophthalmic Meds Ordered this visit:  Meds ordered this encounter  Medications   Bevacizumab (AVASTIN) SOLN 1.25 mg   Bevacizumab (AVASTIN) SOLN 1.25 mg     Return in about 4 weeks (around 10/06/2021) for f/u PDR OU, DFE, OCT.  There are no Patient Instructions on file for this visit.  Explained the diagnoses, plan, and follow up with the patient and they expressed understanding.  Patient expressed understanding of the importance of proper follow up care.   This document serves as a record of services personally performed by [name], MD, PhD. It was created on their behalf by San Jetty. Owens Shark, OA an ophthalmic technician. The creation of this record is the provider's dictation and/or activities during the visit.    Electronically signed by: San Jetty. Owens Shark, [place] 08.31.2023 11:45 PM  [name], M.D., Ph.D. Diseases & Surgery of the Retina and Vitreous [facility]  I have reviewed the above documentation for accuracy and completeness, and I agree with the above. [name], M.D., Ph.D. 09/08/21 11:46 PM   Abbreviations: M myopia (nearsighted); A astigmatism; H hyperopia (farsighted); P presbyopia; Mrx  spectacle prescription;  CTL contact lenses; OD right eye; OS left eye; OU both eyes  XT exotropia; ET esotropia; PEK punctate epithelial keratitis; PEE punctate epithelial erosions; DES dry eye syndrome; MGD meibomian gland dysfunction; ATs artificial tears; PFAT's preservative free artificial tears; [place] nuclear sclerotic cataract; PSC posterior subcapsular cataract; ERM epi-retinal membrane; PVD posterior vitreous detachment; RD retinal detachment; DM diabetes mellitus; DR diabetic retinopathy; NPDR non-proliferative diabetic retinopathy; PDR proliferative diabetic retinopathy; CSME clinically significant macular edema; DME diabetic macular edema; dbh dot blot hemorrhages; CWS cotton wool spot; POAG primary open angle glaucoma; C/D cup-to-disc ratio; HVF humphrey visual field; GVF goldmann visual field; OCT optical coherence tomography; IOP intraocular pressure; BRVO Branch retinal vein occlusion; CRVO central retinal vein occlusion; CRAO central retinal artery occlusion; BRAO branch retinal artery occlusion; RT retinal tear; SB scleral buckle; PPV pars plana vitrectomy; VH  Vitreous hemorrhage; PRP panretinal laser photocoagulation; IVK intravitreal kenalog; VMT vitreomacular traction; MH Macular hole;  NVD neovascularization of the disc; NVE neovascularization elsewhere; AREDS age related eye disease study; ARMD age related macular degeneration; POAG primary open angle glaucoma; EBMD epithelial/anterior basement membrane dystrophy; ACIOL anterior chamber intraocular lens; IOL intraocular lens; PCIOL posterior chamber intraocular lens; Phaco/IOL phacoemulsification with intraocular lens placement; [place] photorefractive keratectomy; LASIK laser assisted in situ keratomileusis; HTN hypertension; DM diabetes mellitus; COPD chronic obstructive pulmonary disease

## 2021-09-07 ENCOUNTER — Ambulatory Visit: Admit: 2021-09-07 | Payer: HMO | Admitting: Ophthalmology

## 2021-09-07 SURGERY — PARS PLANA VITRECTOMY WITH 25 GAUGE
Anesthesia: General | Laterality: Left

## 2021-09-08 ENCOUNTER — Ambulatory Visit (INDEPENDENT_AMBULATORY_CARE_PROVIDER_SITE_OTHER): Payer: HMO | Admitting: Ophthalmology

## 2021-09-08 ENCOUNTER — Encounter (INDEPENDENT_AMBULATORY_CARE_PROVIDER_SITE_OTHER): Payer: Self-pay | Admitting: Ophthalmology

## 2021-09-08 DIAGNOSIS — I10 Essential (primary) hypertension: Secondary | ICD-10-CM | POA: Diagnosis not present

## 2021-09-08 DIAGNOSIS — H25813 Combined forms of age-related cataract, bilateral: Secondary | ICD-10-CM | POA: Diagnosis not present

## 2021-09-08 DIAGNOSIS — H35033 Hypertensive retinopathy, bilateral: Secondary | ICD-10-CM | POA: Diagnosis not present

## 2021-09-08 DIAGNOSIS — E113513 Type 2 diabetes mellitus with proliferative diabetic retinopathy with macular edema, bilateral: Secondary | ICD-10-CM | POA: Diagnosis not present

## 2021-09-08 DIAGNOSIS — H4312 Vitreous hemorrhage, left eye: Secondary | ICD-10-CM

## 2021-09-08 MED ORDER — BEVACIZUMAB CHEMO INJECTION 1.25MG/0.05ML SYRINGE FOR KALEIDOSCOPE
1.2500 mg | INTRAVITREAL | Status: AC | PRN
  Administered 2021-09-08: 1.25 mg via INTRAVITREAL

## 2021-09-25 ENCOUNTER — Encounter: Payer: Self-pay | Admitting: *Deleted

## 2021-09-26 ENCOUNTER — Other Ambulatory Visit: Payer: Self-pay | Admitting: Physician Assistant

## 2021-09-26 DIAGNOSIS — E785 Hyperlipidemia, unspecified: Secondary | ICD-10-CM

## 2021-09-26 NOTE — Progress Notes (Signed)
[facility] Note  10/06/2021     CHIEF COMPLAINT Patient presents for Retina Follow Up  HISTORY OF PRESENT ILLNESS: [name] [name] is a 66 y.o. male who presents to the clinic today for:   HPI     Retina Follow Up   Patient presents with  Diabetic Retinopathy.  In both eyes.  This started years ago.  Duration of 4 weeks.  I, the attending physician,  performed the HPI with the patient and updated documentation appropriately.        Comments   Patient feels that the shots in the left eye have been working well, he has vision in it. His blood sugar 110 and he unsure of the A1C.       Last edited by [name], MD on 10/06/2021 12:21 PM.      Referring physician: [name], [name], PA-C [address] [place],  [place] [address]  HISTORICAL INFORMATION:   Selected notes from the MEDICAL RECORD NUMBER Referred by Dr. [name] for PDR OU   CURRENT MEDICATIONS: No current outpatient medications on file. (Ophthalmic Drugs)   No current facility-administered medications for this visit. (Ophthalmic Drugs)   Current Outpatient Medications (Other)  Medication Sig   apixaban (ELIQUIS) 5 MG TABS tablet Take 1 tablet (5 mg total) by mouth 2 (two) times daily. PLEASE SCHEDULE APPT WITH ALYSSA FOR FURTHER REFILLS [phone]   aspirin EC 81 MG EC tablet Take 1 tablet (81 mg total) by mouth daily.   atorvastatin (LIPITOR) 40 MG tablet TAKE 1 TABLET BY MOUTH EVERY DAY   B Complex Vitamins (B COMPLEX-B12 PO) Take 1 tablet by mouth daily.    BD PEN NEEDLE NANO 2ND GEN 32G X 4 MM MISC USE FOR INSULIN   Blood Glucose Monitoring Suppl (ONETOUCH VERIO FLEX SYSTEM) w/Device KIT    Cholecalciferol (VITAMIN D) 125 MCG (5000 UT) CAPS Take 5,000 Units by mouth daily.    Continuous Blood Gluc Sensor (DEXCOM G6 SENSOR) MISC 1 Device by Does not apply route See admin instructions. Change every 10 days   Continuous Blood Gluc Transmit (DEXCOM G6  TRANSMITTER) MISC Use as instructed to check blood sugar   furosemide (LASIX) 40 MG tablet TAKE 1 TABLET BY MOUTH EVERY DAY   Incontinence Supply Disposable (DISPOSABLE BRIEF LARGE) MISC 1 each by Does not apply route as needed.   insulin aspart (NOVOLOG) 100 UNIT/ML injection Inject 48 units a day in to pump. (Patient taking differently: Inject 50 units a day in to pump.)   Insulin Disposable Pump (OMNIPOD 5 G6 INTRO, GEN 5,) KIT 1 Device by Does not apply route every 3 (three) days.   Insulin Disposable Pump (OMNIPOD 5 G6 POD, GEN 5,) MISC APPLY EVERY 3 (THREE) DAYS.   Lancets (ONETOUCH DELICA PLUS [id number]) MISC Apply 1 each topically 4 (four) times daily.   lidocaine (XYLOCAINE) 5 % ointment Apply 1 Application topically as needed.   loperamide (IMODIUM A-D) 2 MG tablet Take 1 tablet (2 mg total) by mouth 4 (four) times daily as needed for diarrhea or loose stools.   losartan (COZAAR) 25 MG tablet TAKE 1 TABLET BY MOUTH EVERY DAY   mirtazapine (REMERON) 15 MG tablet Take 1 tablet (15 mg total) by mouth at bedtime.   ONETOUCH VERIO test strip 1 each 4 (four) times daily.   PARoxetine (PAXIL) 20 MG tablet TAKE 1 TABLET BY MOUTH EVERY DAY   spironolactone (ALDACTONE) 25 MG tablet TAKE 1  TABLET BY MOUTH EVERY DAY   Current Facility-Administered Medications (Other)  Medication Route   diclofenac Sodium (VOLTAREN) 1 % topical gel 2 g Topical   REVIEW OF SYSTEMS: ROS   Positive for: Neurological, Skin, Musculoskeletal, Endocrine, Cardiovascular, Eyes Negative for: Constitutional, Gastrointestinal, Genitourinary, HENT, Respiratory, Psychiatric, Allergic/Imm, Heme/Lymph Last edited by Annie Paras, COT on 10/06/2021  8:32 AM.     ALLERGIES No Known Allergies  PAST MEDICAL HISTORY Past Medical History:  Diagnosis Date   Anxiety    Arthritis    Cataract    CHF (congestive heart failure) ([place])    Constipation    pt states goes EOD- hard stools    Coronary artery disease     Depression    Diabetes mellitus without complication (HCC)    Diabetic retinopathy ([place])    DKA (diabetic ketoacidoses)    Hyperlipidemia    Hypertension    off meds   Hypertensive retinopathy    Postherpetic neuralgia 08/11/2021   Speculative diagnosis   Psoriasis    Tubular adenoma of colon 03/2019   Past Surgical History:  Procedure Laterality Date   ABDOMINAL AORTOGRAM W/LOWER EXTREMITY N/A 08/30/2020   Procedure: ABDOMINAL AORTOGRAM W/LOWER EXTREMITY;  Surgeon: Serafina Mitchell, MD;  Location: [facility];  Service: Cardiovascular;  Laterality: N/A;   ABDOMINAL AORTOGRAM W/LOWER EXTREMITY N/A 11/15/2020   Procedure: ABDOMINAL AORTOGRAM W/LOWER EXTREMITY;  Surgeon: Serafina Mitchell, MD;  Location: [facility];  Service: Cardiovascular;  Laterality: N/A;   ANKLE SURGERY     right   CARDIAC CATHETERIZATION  09/25/2001   COLONOSCOPY     ~10 yr ago- normal  per pt    CORONARY ARTERY BYPASS GRAFT  09/30/2001   CABG x 4   IR THORACENTESIS ASP PLEURAL SPACE W/IMG GUIDE  05/19/2020   LAPAROSCOPIC INGUINAL HERNIA REPAIR Bilateral 02/09/2010   MASS EXCISION Left 03/12/2013   Procedure: LEFT INDEX EXCISION MASS AND DEBRIDMENT DISTAL INTERPHALANGEAL JOINT;  Surgeon: Tennis Must, MD;  Location: [facility];  Service: Orthopedics;  Laterality: Left;   PERIPHERAL VASCULAR BALLOON ANGIOPLASTY Left 08/30/2020   Procedure: PERIPHERAL VASCULAR BALLOON ANGIOPLASTY;  Surgeon: Serafina Mitchell, MD;  Location: [facility];  Service: Cardiovascular;  Laterality: Left;  Peroneal   FAMILY HISTORY Family History  Problem Relation Age of Onset   Lymphoma Father    Cancer Paternal Grandmother        Colon Cancer   Colon cancer Paternal Grandmother    Colon polyps Neg Hx    Esophageal cancer Neg Hx    Rectal cancer Neg Hx    Stomach cancer Neg Hx    Diabetes Mellitus I Neg Hx    Pancreatic cancer Neg Hx    SOCIAL HISTORY Social History   Tobacco Use   Smoking  status: Some Days    Types: Cigars   Smokeless tobacco: Never  Vaping Use   Vaping Use: Never used  Substance Use Topics   Alcohol use: Yes    Comment: daily beer or scotch    Drug use: No       OPHTHALMIC EXAM: Base Eye Exam     Visual Acuity (Snellen - Linear)       Right Left   Dist [place] 20/60 -2 CF at 3'   Dist ph [place] 20/40 20/100 +2         Tonometry (Tonopen, 8:36 AM)       Right Left   Pressure 10  9         Pupils       Dark Light Shape React APD   Right 4 3 Round Slow None   Left 4 3 Round Slow None         Visual Fields       Left Right     Full   Restrictions Partial outer superior nasal, inferior nasal deficiencies          Extraocular Movement       Right Left    Full, Ortho Full, Ortho         Neuro/Psych     Oriented x3: Yes   Mood/Affect: Normal         Dilation     Both eyes: 1.0% Mydriacyl, 2.5% Phenylephrine @ 8:32 AM           Slit Lamp and Fundus Exam     External Exam       Right Left   External Normal Normal         Slit Lamp Exam       Right Left   Lids/Lashes Dermatochalasis - upper lid, Meibomian gland dysfunction Dermatochalasis - upper lid, Meibomian gland dysfunction   Conjunctiva/Sclera White and quiet White and quiet   Cornea arcus, 2-3+ Punctate epithelial erosions arcus, 2+ Punctate epithelial erosions   Anterior Chamber Deep and quiet Deep and quiet   Iris Round and dilated, No NVI Round and dilated, No NVI   Lens 2-3+ Nuclear sclerosis with brunescence, 2+ Cortical cataract 2-3+ Nuclear sclerosis with brunescence, 2-3+ Cortical cataract   Anterior Vitreous Vitreous syneresis, blood stained vitreous condensations -- improved / resolved Vitreous syneresis, diffuse VH - improved, white blood stained vitreous condensations, pre-retinal heme white and settled inferiorly         Fundus Exam       Right Left   Disc Pink and Sharp, NVD - regressed hazy view, Compact, fibrosis, mild Pallor    C/D Ratio 0.2 not visible   Macula Flat, good foveal reflex, scatterd MA/DBH greatest temporally, +cystic changes/edema temporal fovea and macula -- stably improved hazy view w/ residual VH and fibrosis, +traction   Vessels +NVE just inside ST arcades - improved, +fibrosis, attenuated, Tortuous Severe attenuation, Tortuous, +NVD; NVE along ST arcades - regressing   Periphery Attached; scattered DBH, good 360 PRP with room for fill in Hazy view, scattered fibrosis, grossly attached, scattered PRP visible, scattered MA/DBH           Refraction     Wearing Rx       Sphere Cylinder Axis Add   Right +0.25 +2.00 180 +2.50   Left +0.50 +1.75 009 +2.50           IMAGING AND PROCEDURES  Imaging and Procedures for 10/06/2021  OCT, Retina - OU - Both Eyes       Right Eye Quality was borderline. Central Foveal Thickness: 241. Progression has been stable. Findings include no IRF, no SRF, abnormal foveal contour, intraretinal hyper-reflective material, vitreous traction, preretinal fibrosis (Partial PVD, stable improvement in cystic changes temporal macula and fovea, persistent PRF with traction).   Left Eye Quality was poor. Central Foveal Thickness: 201. Progression has improved. Findings include no SRF, abnormal foveal contour, intraretinal hyper-reflective material, intraretinal fluid, vitreous traction (Mild interval improvement in vitreous opacities, persistent vitreous traction greatest along proximal arcades and disc).   Notes *Images captured and stored on drive  Diagnosis / Impression:  PDR with DME OU [id number]  PVD, stable improvement in cystic changes temporal macula and fovea, persistent PRF with traction OS: Interval improvement in vitreous opacities, persistent vitreous traction greatest along proximal arcades and disc  Clinical management:  See below  Abbreviations: NFP - Normal foveal profile. CME - cystoid macular edema. PED - pigment epithelial detachment. IRF -  intraretinal fluid. SRF - subretinal fluid. EZ - ellipsoid zone. ERM - epiretinal membrane. ORA - outer retinal atrophy. ORT - outer retinal tubulation. SRHM - subretinal hyper-reflective material. IRHM - intraretinal hyper-reflective material      Intravitreal Injection, Pharmacologic Agent - OD - Right Eye       Time Out 10/06/2021. 9:18 AM. Confirmed correct patient, procedure, site, and patient consented.   Anesthesia Topical anesthesia was used. Anesthetic medications included Lidocaine 2%, Proparacaine 0.5%.   Procedure Preparation included 5% betadine to ocular surface, eyelid speculum. A (32g) needle was used.   Injection: 1.25 mg Bevacizumab 1.33m/0.05ml   Route: Intravitreal, Site: Right Eye   NDC: 50242-060-01, Lot:: [id number] Expiration date: 11/15/2021   Post-op Post injection exam found visual acuity of at least counting fingers. The patient tolerated the procedure well. There were no complications. The patient received written and verbal post procedure care education. Post injection medications were not given.      Intravitreal Injection, Pharmacologic Agent - OS - Left Eye       Time Out 10/06/2021. 9:19 AM. Confirmed correct patient, procedure, site, and patient consented.   Anesthesia Topical anesthesia was used. Anesthetic medications included Lidocaine 2%, Proparacaine 0.5%.   Procedure Preparation included 5% betadine to ocular surface, eyelid speculum. A (32 g) needle was used.   Injection: 1.25 mg Bevacizumab 1.215m0.05ml   Route: Intravitreal, Site: Left Eye   NDC: : 65681-275-17Lot: [id number] , Expiration date: 11/12/2021   Post-op Post injection exam found visual acuity of at least counting fingers. The patient tolerated the procedure well. There were no complications. The patient received written and verbal post procedure care education. Post injection medications were not given.            ASSESSMENT/PLAN:    ICD-10-CM   1.  Proliferative diabetic retinopathy of both eyes with macular edema associated with type 2 diabetes mellitus (HCC)  E11.3513 OCT, Retina - OU - Both Eyes    Intravitreal Injection, Pharmacologic Agent - OD - Right Eye    Intravitreal Injection, Pharmacologic Agent - OS - Left Eye    Bevacizumab (AVASTIN) SOLN 1.25 mg    Bevacizumab (AVASTIN) SOLN 1.25 mg    2. Vitreous hemorrhage of left eye (HCC)  H43.12     3. Essential hypertension  I10     4. Hypertensive retinopathy of both eyes  H35.033     5. Combined forms of age-related cataract of both eyes  H25.813      1. Severe proliferative diabetic retinopathy, both eyes  - last A1c 9.7 on 08.18.23; 9.2 on 4.20.23; A1c >15% on 10.11.22 - s/p PRP OS (11.10.22)  - s/p PRP OD (12.02.22)  - s/p IVA OS #1 (01.18.23) for VH, #2 (03.17.23), #3 (04.14.23), #4 (05.12.23), #5 (06.09.23), #6 (07.07.23), #7 (08.10.23), #8 (09.08.23)  - s/p IVA OD #1 (01.27.23) for macular edema/NVD, #2 (03.17.23), #3 (04.14.23), #4 (05.12.23), #5 (06.09.23), #6 (07.07.23), #7 (08.10.23), #8 (09.08.23) - pt reports improved BG control -- on insulin pump - exam shows extensive neovascularization, fibrosis and hemorrhage OU (OS >>> OD)  - OD with improved NVD / VH - OS with improved VH - BCVA 20/40 OD;  20/100 OS - FA (11.09.22) shows areas of vascular non-perfusion OU, +NVD OU and +NVE OU - OCT shows [id number] PVD, stable improvement in cystic changes temporal macula and fovea, persistent PRF with traction; OS: Interval improvement in vitreous opacities, persistent vitreous traction greatest along proximal arcades and disc - discussed findings, severity of his disease, prognosis and likely need for extensive treatments -- PRP OU, multiple rounds of anti-VEGF therapy OU and possible surgery - recommend IVA OU #8 today, 09.08.23 for PDR OU - pt wishes to proceed with injections - RBA of procedure discussed, questions answered - informed consent obtained and signed,  01.18.23 - see procedure note - f/u October 27, DFE/POV  2. Vitreous hemorrhage OS  - acute increase in VH OS (late Dec 2022) per pt report  - s/p multiple IVA OS as above  - VH remains present and now with increasing vitreous traction and fibrosis  - recommend PPV w/ membrane peel OS under general anesthesia - pt wishes to proceed with surgery - surgery scheduled for Thursday, October 26, 11:30 am, [facility] OR 8 - f/u Oct. 27 -- POD1  3,4. Hypertensive retinopathy OU - discussed importance of tight BP control - monitor   5. Mixed Cataract OU - The symptoms of cataract, surgical options, and treatments and risks were discussed with patient - discussed diagnosis and progression - monitor   Ophthalmic Meds Ordered this visit:  Meds ordered this encounter  Medications   Bevacizumab (AVASTIN) SOLN 1.25 mg   Bevacizumab (AVASTIN) SOLN 1.25 mg     Return in about 3 weeks (around 10/27/2021) for POD 1, DFE OS only.  There are no Patient Instructions on file for this visit.  Explained the diagnoses, plan, and follow up with the patient and they expressed understanding.  Patient expressed understanding of the importance of proper follow up care.   This document serves as a record of services personally performed by [name], MD, PhD. It was created on their behalf by San Jetty. Owens Shark, OA an ophthalmic technician. The creation of this record is the provider's dictation and/or activities during the visit.    Electronically signed by: San Jetty. Owens Shark, [place] 09.26.2023 12:30 PM  [name], M.D., Ph.D. Diseases & Surgery of the Retina and Vitreous [facility]  I have reviewed the above documentation for accuracy and completeness, and I agree with the above. [name], M.D., Ph.D. 10/06/21 12:30 PM   Abbreviations: M myopia (nearsighted); A astigmatism; H hyperopia (farsighted); P presbyopia; Mrx spectacle prescription;  CTL contact lenses; OD right  eye; OS left eye; OU both eyes  XT exotropia; ET esotropia; PEK punctate epithelial keratitis; PEE punctate epithelial erosions; DES dry eye syndrome; MGD meibomian gland dysfunction; ATs artificial tears; PFAT's preservative free artificial tears; [place] nuclear sclerotic cataract; PSC posterior subcapsular cataract; ERM epi-retinal membrane; PVD posterior vitreous detachment; RD retinal detachment; DM diabetes mellitus; DR diabetic retinopathy; NPDR non-proliferative diabetic retinopathy; PDR proliferative diabetic retinopathy; CSME clinically significant macular edema; DME diabetic macular edema; dbh dot blot hemorrhages; CWS cotton wool spot; POAG primary open angle glaucoma; C/D cup-to-disc ratio; HVF humphrey visual field; GVF goldmann visual field; OCT optical coherence tomography; IOP intraocular pressure; BRVO Branch retinal vein occlusion; CRVO central retinal vein occlusion; CRAO central retinal artery occlusion; BRAO branch retinal artery occlusion; RT retinal tear; SB scleral buckle; PPV pars plana vitrectomy; VH Vitreous hemorrhage; PRP panretinal laser photocoagulation; IVK intravitreal kenalog; VMT vitreomacular traction; MH Macular hole;  NVD neovascularization  of the disc; NVE neovascularization elsewhere; AREDS age related eye disease study; ARMD age related macular degeneration; POAG primary open angle glaucoma; EBMD epithelial/anterior basement membrane dystrophy; ACIOL anterior chamber intraocular lens; IOL intraocular lens; PCIOL posterior chamber intraocular lens; Phaco/IOL phacoemulsification with intraocular lens placement; [place] photorefractive keratectomy; LASIK laser assisted in situ keratomileusis; HTN hypertension; DM diabetes mellitus; COPD chronic obstructive pulmonary disease

## 2021-10-01 ENCOUNTER — Other Ambulatory Visit: Payer: Self-pay | Admitting: Physician Assistant

## 2021-10-01 DIAGNOSIS — F3341 Major depressive disorder, recurrent, in partial remission: Secondary | ICD-10-CM

## 2021-10-06 ENCOUNTER — Ambulatory Visit (INDEPENDENT_AMBULATORY_CARE_PROVIDER_SITE_OTHER): Payer: HMO | Admitting: Ophthalmology

## 2021-10-06 ENCOUNTER — Encounter (INDEPENDENT_AMBULATORY_CARE_PROVIDER_SITE_OTHER): Payer: Self-pay | Admitting: Ophthalmology

## 2021-10-06 DIAGNOSIS — H25813 Combined forms of age-related cataract, bilateral: Secondary | ICD-10-CM | POA: Diagnosis not present

## 2021-10-06 DIAGNOSIS — H4312 Vitreous hemorrhage, left eye: Secondary | ICD-10-CM

## 2021-10-06 DIAGNOSIS — I10 Essential (primary) hypertension: Secondary | ICD-10-CM

## 2021-10-06 DIAGNOSIS — E113513 Type 2 diabetes mellitus with proliferative diabetic retinopathy with macular edema, bilateral: Secondary | ICD-10-CM

## 2021-10-06 DIAGNOSIS — H35033 Hypertensive retinopathy, bilateral: Secondary | ICD-10-CM | POA: Diagnosis not present

## 2021-10-06 MED ORDER — BEVACIZUMAB CHEMO INJECTION 1.25MG/0.05ML SYRINGE FOR KALEIDOSCOPE
1.2500 mg | INTRAVITREAL | Status: AC | PRN
  Administered 2021-10-06: 1.25 mg via INTRAVITREAL

## 2021-10-13 ENCOUNTER — Ambulatory Visit: Payer: HMO | Admitting: Physician Assistant

## 2021-10-18 ENCOUNTER — Encounter: Payer: Self-pay | Admitting: Podiatry

## 2021-10-18 ENCOUNTER — Ambulatory Visit (INDEPENDENT_AMBULATORY_CARE_PROVIDER_SITE_OTHER): Payer: HMO | Admitting: Podiatry

## 2021-10-18 DIAGNOSIS — M79675 Pain in left toe(s): Secondary | ICD-10-CM

## 2021-10-18 DIAGNOSIS — I739 Peripheral vascular disease, unspecified: Secondary | ICD-10-CM

## 2021-10-18 DIAGNOSIS — D689 Coagulation defect, unspecified: Secondary | ICD-10-CM | POA: Diagnosis not present

## 2021-10-18 DIAGNOSIS — B351 Tinea unguium: Secondary | ICD-10-CM

## 2021-10-18 DIAGNOSIS — E109 Type 1 diabetes mellitus without complications: Secondary | ICD-10-CM | POA: Diagnosis not present

## 2021-10-18 DIAGNOSIS — M79674 Pain in right toe(s): Secondary | ICD-10-CM

## 2021-10-18 NOTE — Progress Notes (Signed)
This patient returns to my office for at risk foot care.  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  This patient is unable to cut nails himself since the patient cannot reach his nails.These nails are painful walking and wearing shoes.  He presents to the office with his mother.This patient presents for at risk foot care today.  General Appearance  Alert, conversant and in no acute stress.  Vascular  Dorsalis pedis and posterior tibial  pulses are palpable  bilaterally.  Capillary return is within normal limits  bilaterally. Temperature is within normal limits  bilaterally.  Neurologic  Senn-Weinstein monofilament wire test within normal limits  bilaterally. Muscle power within normal limits bilaterally.  Nails Thick disfigured discolored nails with subungual debris  from hallux to fifth toes bilaterally. No evidence of bacterial infection or drainage bilaterally.  Orthopedic  No limitations of motion  feet .  No crepitus or effusions noted.  No bony pathology or digital deformities noted.  Skin  normotropic skin with no porokeratosis noted bilaterally.  No signs of infections or ulcers noted.     Onychomycosis  Pain in right toes  Pain in left toes  Consent was obtained for treatment procedures.   Mechanical debridement of nails 1-5  bilaterally performed with a nail nipper.  Filed with dremel without incident.    Return office visit   3  months                   Told patient to return for periodic foot care and evaluation due to potential at risk complications.   Gardiner Barefoot DPM

## 2021-10-20 NOTE — Pre-Procedure Instructions (Signed)
Surgical Instructions    Your procedure is scheduled on Thursday, October 26, 2021 at 11:30 AM.  Report to [facility] Main Entrance "A" at 9:30 A.M., then check in with the Admitting office.  Call this number if you have problems the morning of surgery:  [phone]   If you have any questions prior to your surgery date call [phone]: Open Monday-Friday 8am-4pm  *If you experience any cold or flu symptoms such as cough, fever, chills, shortness of breath, etc. between now and your scheduled surgery, please notify us.*    Remember:  Do not eat after midnight the night before your surgery  You may drink clear liquids until 8:30 AM the morning of your surgery.   Clear liquids allowed are: Water, Non-Citrus Juices (without pulp), Carbonated Beverages, Clear Tea, Black Coffee Only (NO MILK, CREAM OR POWDERED CREAMER of any kind), and Gatorade.    Take these medicines the morning of surgery with A SIP OF WATER:  atorvastatin (LIPITOR) PARoxetine (PAXIL) loperamide (IMODIUM A-D) - if needed  Follow your surgeon's instructions on when to stop Aspirin and apixaban (ELIQUIS).  If no instructions were given by your surgeon then you will need to call the office to get those instructions.     As of today, STOP taking any Aleve, Naproxen, Ibuprofen, Motrin, Advil, Goody's, BC's, all herbal medications, fish oil, and all vitamins.  WHAT DO I DO ABOUT MY DIABETES MEDICATION?   CONTACT YOUR ENDOCRINOLOGIST OR REDUCE YOUR BASAL RATE BY 20% (40 UNITS) AT MIDNIGHT BEFORE YOUR SURGERY.   HOW TO MANAGE YOUR DIABETES BEFORE AND AFTER SURGERY  Why is it important to control my blood sugar before and after surgery? Improving blood sugar levels before and after surgery helps healing and can limit problems. A way of improving blood sugar control is eating a healthy diet by:  Eating less sugar and carbohydrates  Increasing activity/exercise  Talking with your doctor about reaching your blood  sugar goals High blood sugars (greater than 180 mg/dL) can raise your risk of infections and slow your recovery, so you will need to focus on controlling your diabetes during the weeks before surgery. Make sure that the doctor who takes care of your diabetes knows about your planned surgery including the date and location.  How do I manage my blood sugar before surgery? Check your blood sugar at least 4 times a day, starting 2 days before surgery, to make sure that the level is not too high or low.  Check your blood sugar the morning of your surgery when you wake up and every 2 hours until you get to the Short Stay unit.  If your blood sugar is less than 70 mg/dL, you will need to treat for low blood sugar: Do not take insulin. Treat a low blood sugar (less than 70 mg/dL) with  cup of clear juice (cranberry or apple), 4 glucose tablets, OR glucose gel. Recheck blood sugar in 15 minutes after treatment (to make sure it is greater than 70 mg/dL). If your blood sugar is not greater than 70 mg/dL on recheck, call [phone] for further instructions. Report your blood sugar to the short stay nurse when you get to Short Stay.  If you are admitted to the hospital after surgery: Your blood sugar will be checked by the staff and you will probably be given insulin after surgery (instead of oral diabetes medicines) to make sure you have good blood sugar levels. The goal for blood sugar control after surgery  is 80-180 mg/dL.                      Do NOT Smoke (Tobacco/Vaping) for 24 hours prior to your procedure.  If you use a CPAP at night, you may bring your mask/headgear for your overnight stay.   Contacts, glasses, piercing's, hearing aid's, dentures or partials may not be worn into surgery, please bring cases for these belongings.    For patients admitted to the hospital, discharge time will be determined by your treatment team.   Patients discharged the day of surgery will not be allowed to  drive home, and someone needs to stay with them for 24 hours.  SURGICAL WAITING ROOM VISITATION Patients having surgery or a procedure may have two support people in the waiting area. Visitors may stay in the waiting area during the procedure and switch out with other visitors if needed. Children under the age of 35 must have an adult accompany them who is not the patient. If the patient needs to stay at the hospital during part of their recovery, the visitor guidelines for inpatient rooms apply.  Please refer to the [facility] website for the visitor guidelines for Inpatients (after your surgery is over and you are in a regular room).    Special instructions:   [place]- Preparing For Surgery  Before surgery, you can play an important role. Because skin is not sterile, your skin needs to be as free of germs as possible. You can reduce the number of germs on your skin by washing with CHG (chlorahexidine gluconate) Soap before surgery.  CHG is an antiseptic cleaner which kills germs and bonds with the skin to continue killing germs even after washing.    Oral Hygiene is also important to reduce your risk of infection.  Remember - BRUSH YOUR TEETH THE MORNING OF SURGERY WITH YOUR REGULAR TOOTHPASTE  Please do not use if you have an allergy to CHG or antibacterial soaps. If your skin becomes reddened/irritated stop using the CHG.  Do not shave (including legs and underarms) for at least 48 hours prior to first CHG shower. It is OK to shave your face.  Please follow these instructions carefully.   Shower the NIGHT BEFORE SURGERY and the MORNING OF SURGERY  If you chose to wash your hair, wash your hair first as usual with your normal shampoo.  After you shampoo, rinse your hair and body thoroughly to remove the shampoo.  Use CHG Soap as you would any other liquid soap. You can apply CHG directly to the skin and wash gently with a scrungie or a clean washcloth.   Apply the CHG Soap to  your body ONLY FROM THE NECK DOWN.  Do not use on open wounds or open sores. Avoid contact with your eyes, ears, mouth and genitals (private parts). Wash Face and genitals (private parts)  with your normal soap.   Wash thoroughly, paying special attention to the area where your surgery will be performed.  Thoroughly rinse your body with warm water from the neck down.  DO NOT shower/wash with your normal soap after using and rinsing off the CHG Soap.  Pat yourself dry with a CLEAN TOWEL.  Wear CLEAN PAJAMAS to bed the night before surgery  Place CLEAN SHEETS on your bed the night before your surgery  DO NOT SLEEP WITH PETS.   Day of Surgery: Take a shower with CHG soap. Do not wear jewelry. Do not wear lotions, powders, perfumes/colognes,  or deodorant. Do not shave 48 hours prior to surgery.  Men may shave face and neck. Do not bring valuables to the hospital.  [facility] is not responsible for any belongings or valuables. Wear Clean/Comfortable clothing the morning of surgery Do not apply any deodorants/lotions.   Remember to brush your teeth WITH YOUR REGULAR TOOTHPASTE.   Please read over the following fact sheets that you were given.  If you received a COVID test during your pre-op visit  it is requested that you wear a mask when out in public, stay away from anyone that may not be feeling well and notify your surgeon if you develop symptoms. If you have been in contact with anyone that has tested positive in the last 10 days please notify you surgeon.

## 2021-10-23 ENCOUNTER — Other Ambulatory Visit: Payer: Self-pay

## 2021-10-23 ENCOUNTER — Encounter (HOSPITAL_COMMUNITY)
Admission: RE | Admit: 2021-10-23 | Discharge: 2021-10-23 | Disposition: A | Discharge status: Home / Self Care | Payer: HMO | Source: Ambulatory Visit | Attending: Ophthalmology | Admitting: Ophthalmology

## 2021-10-23 ENCOUNTER — Encounter (HOSPITAL_COMMUNITY): Payer: Self-pay

## 2021-10-23 VITALS — BP 178/80 | HR 59 | Temp 98.0°F | Resp 18 | Ht 74.0 in | Wt 177.5 lb

## 2021-10-23 DIAGNOSIS — Z794 Long term (current) use of insulin: Secondary | ICD-10-CM | POA: Diagnosis not present

## 2021-10-23 DIAGNOSIS — E1159 Type 2 diabetes mellitus with other circulatory complications: Secondary | ICD-10-CM | POA: Diagnosis not present

## 2021-10-23 DIAGNOSIS — Z01812 Encounter for preprocedural laboratory examination: Secondary | ICD-10-CM | POA: Diagnosis not present

## 2021-10-23 DIAGNOSIS — Z01818 Encounter for other preprocedural examination: Secondary | ICD-10-CM | POA: Diagnosis present

## 2021-10-23 LAB — COMPREHENSIVE METABOLIC PANEL
ALT: 13 U/L (ref 0–44)
AST: 18 U/L (ref 15–41)
Albumin: 2.8 g/dL — ABNORMAL LOW (ref 3.5–5.0)
Alkaline Phosphatase: 91 U/L (ref 38–126)
Anion gap: 5 (ref 5–15)
BUN: 13 mg/dL (ref 8–23)
CO2: 28 mmol/L (ref 22–32)
Calcium: 8.2 mg/dL — ABNORMAL LOW (ref 8.9–10.3)
Chloride: 103 mmol/L (ref 98–111)
Creatinine, Ser: 0.73 mg/dL (ref 0.61–1.24)
GFR, Estimated: >60 mL/min (ref >60)
Glucose, Bld: 269 mg/dL — ABNORMAL HIGH (ref 70–99)
Potassium: 3.5 mmol/L (ref 3.5–5.1)
Sodium: 136 mmol/L (ref 135–145)
Total Bilirubin: 0.5 mg/dL (ref 0.3–1.2)
Total Protein: 6.1 g/dL — ABNORMAL LOW (ref 6.5–8.1)

## 2021-10-23 LAB — CBC
HCT: 34.7 % — ABNORMAL LOW (ref 39.0–52.0)
Hemoglobin: 11.4 g/dL — ABNORMAL LOW (ref 13.0–17.0)
MCH: 30.3 pg (ref 26.0–34.0)
MCHC: 32.9 g/dL (ref 30.0–36.0)
MCV: 92.3 fL (ref 80.0–100.0)
Platelets: 196 10*3/uL (ref 150–400)
RBC: 3.76 MIL/uL — ABNORMAL LOW (ref 4.22–5.81)
RDW: 13.8 % (ref 11.5–15.5)
WBC: 6.4 10*3/uL (ref 4.0–10.5)
nRBC: 0 % (ref 0.0–0.2)

## 2021-10-23 LAB — HEMOGLOBIN A1C
Hgb A1c MFr Bld: 7.8 % — ABNORMAL HIGH (ref 4.8–5.6)
Mean Plasma Glucose: 177.16 mg/dL

## 2021-10-23 LAB — GLUCOSE, CAPILLARY: Glucose-Capillary: 225 mg/dL — ABNORMAL HIGH (ref 70–99)

## 2021-10-23 NOTE — Progress Notes (Addendum)
PCP - Yetta Flock [name] PA-C Cardiologist - O'Neill  PPM/ICD - denies  Chest x-ray - n/a EKG - 07/19/21 Stress Test - "it's been a long time" ECHO - 02/15/21 Cardiac Cath - 09/2001  Sleep Study - denies CPAP - n/a  Fasting Blood Sugar - 230-240+ Checks Blood Sugar continuously  CONTACT YOUR ENDOCRINOLOGIST OR REDUCE YOUR BASAL RATE BY 20% (0.56) AT MIDNIGHT BEFORE YOUR SURGERY.  Follow your surgeon's instructions on when to stop Aspirin and apixaban (ELIQUIS).  If no instructions were given by your surgeon then you will need to call the office to get those instructions.      As of today, STOP taking any Aleve, Naproxen, Ibuprofen, Motrin, Advil, Goody's, BC's, all herbal medications, fish oil, and all vitamins.  ERAS Protcol - yes PRE-SURGERY Ensure or G2- no  COVID TEST- no   Anesthesia review: yes- complex diabetic comorbidities  Patient denies shortness of breath, fever, cough and chest pain at PAT appointment   All instructions explained to the patient, with a verbal understanding of the material. Patient agrees to go over the instructions while at home for a better understanding.The opportunity to ask questions was provided.

## 2021-10-23 NOTE — H&P (Signed)
[name] [name] is an 66 y.o. male.    Chief Complaint: Decreased vision, LEFT EYE   HPI: Pt with complex medical history that includes long history of uncontrolled diabetes. Pt presented with decreased vision OS and was found to have a dense vitreous hemorrhage and extensive vitreous traction OS secondary to proliferative diabetic retinopathy. After a discussion of the risks benefits and alternatives to surgery, the patient has elected to proceed with surgery to clear the vitreous hemorrhage and repair any retinal pathologies amenable to treatment: 25g PPV w/ membrane peel, endolaser and possible silicon oil vs gas, LEFT EYE, under general anesthesia.  Past Medical History:  Diagnosis Date   Anxiety    Arthritis    Cataract    CHF (congestive heart failure) ([place])    Constipation    pt states goes EOD- hard stools    Coronary artery disease    Depression    Diabetes mellitus without complication (HCC)    Diabetic retinopathy ([place])    DKA (diabetic ketoacidoses)    Hyperlipidemia    Hypertension    off meds   Hypertensive retinopathy    Postherpetic neuralgia 08/11/2021   Speculative diagnosis   Psoriasis    Tubular adenoma of colon 03/2019    Past Surgical History:  Procedure Laterality Date   ABDOMINAL AORTOGRAM W/LOWER EXTREMITY N/A 08/30/2020   Procedure: ABDOMINAL AORTOGRAM W/LOWER EXTREMITY;  Surgeon: Serafina Mitchell, MD;  Location: [place] [facility];  Service: Cardiovascular;  Laterality: N/A;   ABDOMINAL AORTOGRAM W/LOWER EXTREMITY N/A 11/15/2020   Procedure: ABDOMINAL AORTOGRAM W/LOWER EXTREMITY;  Surgeon: Serafina Mitchell, MD;  Location: [place] [facility];  Service: Cardiovascular;  Laterality: N/A;   ANKLE SURGERY     right   CARDIAC CATHETERIZATION  09/25/2001   COLONOSCOPY     ~10 yr ago- normal  per pt    CORONARY ARTERY BYPASS GRAFT  09/30/2001   CABG x 4   IR THORACENTESIS ASP PLEURAL SPACE W/IMG GUIDE  05/19/2020   LAPAROSCOPIC INGUINAL HERNIA REPAIR  Bilateral 02/09/2010   MASS EXCISION Left 03/12/2013   Procedure: LEFT INDEX EXCISION MASS AND DEBRIDMENT DISTAL INTERPHALANGEAL JOINT;  Surgeon: Tennis Must, MD;  Location: [place];  Service: Orthopedics;  Laterality: Left;   PERIPHERAL VASCULAR BALLOON ANGIOPLASTY Left 08/30/2020   Procedure: PERIPHERAL VASCULAR BALLOON ANGIOPLASTY;  Surgeon: Serafina Mitchell, MD;  Location: [place] [facility];  Service: Cardiovascular;  Laterality: Left;  Peroneal    Family History  Problem Relation Age of Onset   Lymphoma Father    Cancer Paternal Grandmother        Colon Cancer   Colon cancer Paternal Grandmother    Colon polyps Neg Hx    Esophageal cancer Neg Hx    Rectal cancer Neg Hx    Stomach cancer Neg Hx    Diabetes Mellitus I Neg Hx    Pancreatic cancer Neg Hx    Social History:  reports that he has been smoking cigars. He has never used smokeless tobacco. He reports current alcohol use. He reports that he does not use drugs.  Allergies: No Known Allergies  No medications prior to admission.    Review of systems otherwise negative  There were no vitals taken for this visit.  Physical exam: Mental status: oriented x3. Eyes: See eye exam associated with this date of surgery Ears, Nose, Throat: within normal limits Neck: Within Normal limits General: within normal limits Chest: Within normal limits Breast: deferred Heart:  Within normal limits Abdomen: Within normal limits GU: deferred Extremities: within normal limits Skin: within normal limits  Assessment/Plan Proliferative Diabetic Retinopathy with vitreous hemorrhage and vitreous traction, OS   Plan: To [facility] for 25g PPV w/ membrane peel, endolaser, and gas vs silicon oil, LEFT EYE, under general anesthesia. - case scheduled for Thursday, 10.26.23, 1130 am -- [facility] OR 08 - preop w/ Anesthesia scheduled for Monday, 10.23.23  [name], M.D., Ph.D. Vitreoretinal Surgeon [facility]

## 2021-10-23 NOTE — Progress Notes (Shared)
[name] [name] is an 66 y.o. male.    Chief Complaint: Decreased vision, LEFT EYE   HPI: Pt with complex medical history that includes long history of uncontrolled diabetes. Pt presented with decreased vision OS and was found to have a dense vitreous hemorrhage and extensive vitreous traction OS secondary to proliferative diabetic retinopathy. After a discussion of the risks benefits and alternatives to surgery, the patient has elected to proceed with surgery to clear the vitreous hemorrhage and repair any retinal pathologies amenable to treatment: 25g PPV w/ membrane peel, endolaser and possible silicon oil vs gas, LEFT EYE, under general anesthesia.  Past Medical History:  Diagnosis Date   Anxiety    Arthritis    Cataract    CHF (congestive heart failure) ([place])    Constipation    pt states goes EOD- hard stools    Coronary artery disease    Depression    Diabetes mellitus without complication (HCC)    Diabetic retinopathy ([place])    DKA (diabetic ketoacidoses)    Hyperlipidemia    Hypertension    off meds   Hypertensive retinopathy    Postherpetic neuralgia 08/11/2021   Speculative diagnosis   Psoriasis    Tubular adenoma of colon 03/2019    Past Surgical History:  Procedure Laterality Date   ABDOMINAL AORTOGRAM W/LOWER EXTREMITY N/A 08/30/2020   Procedure: ABDOMINAL AORTOGRAM W/LOWER EXTREMITY;  Surgeon: Serafina Mitchell, MD;  Location: [place] [facility];  Service: Cardiovascular;  Laterality: N/A;   ABDOMINAL AORTOGRAM W/LOWER EXTREMITY N/A 11/15/2020   Procedure: ABDOMINAL AORTOGRAM W/LOWER EXTREMITY;  Surgeon: Serafina Mitchell, MD;  Location: [place] [facility];  Service: Cardiovascular;  Laterality: N/A;   ANKLE SURGERY     right   CARDIAC CATHETERIZATION  09/25/2001   COLONOSCOPY     ~10 yr ago- normal  per pt    CORONARY ARTERY BYPASS GRAFT  09/30/2001   CABG x 4   IR THORACENTESIS ASP PLEURAL SPACE W/IMG GUIDE  05/19/2020   LAPAROSCOPIC INGUINAL HERNIA REPAIR  Bilateral 02/09/2010   MASS EXCISION Left 03/12/2013   Procedure: LEFT INDEX EXCISION MASS AND DEBRIDMENT DISTAL INTERPHALANGEAL JOINT;  Surgeon: Tennis Must, MD;  Location: [place];  Service: Orthopedics;  Laterality: Left;   PERIPHERAL VASCULAR BALLOON ANGIOPLASTY Left 08/30/2020   Procedure: PERIPHERAL VASCULAR BALLOON ANGIOPLASTY;  Surgeon: Serafina Mitchell, MD;  Location: [place] [facility];  Service: Cardiovascular;  Laterality: Left;  Peroneal    Family History  Problem Relation Age of Onset   Lymphoma Father    Cancer Paternal Grandmother        Colon Cancer   Colon cancer Paternal Grandmother    Colon polyps Neg Hx    Esophageal cancer Neg Hx    Rectal cancer Neg Hx    Stomach cancer Neg Hx    Diabetes Mellitus I Neg Hx    Pancreatic cancer Neg Hx    Social History:  reports that he quit smoking about 21 months ago. His smoking use included cigars. He has never used smokeless tobacco. He reports current alcohol use. He reports that he does not use drugs.  Allergies:  Allergies  Allergen Reactions   Wool Alcohol [Lanolin] Hives    Wool fabric    (Not in a hospital admission)   Review of systems otherwise negative  There were no vitals taken for this visit.  Physical exam: Mental status: oriented x3. Eyes: See eye exam associated with this date of surgery  Ears, Nose, Throat: within normal limits Neck: Within Normal limits General: within normal limits Chest: Within normal limits Breast: deferred Heart: Within normal limits Abdomen: Within normal limits GU: deferred Extremities: within normal limits Skin: within normal limits  Assessment/Plan Proliferative Diabetic Retinopathy with vitreous hemorrhage and vitreous traction, OS   Plan: To [facility] for 25g PPV w/ membrane peel, endolaser, and gas vs silicon oil, LEFT EYE, under general anesthesia. - case scheduled for Thursday, 10.26.23, 1130 am -- [facility] OR 08 - preop w/ Anesthesia  scheduled for Monday, 10.23.23  [name], M.D., Ph.D. Vitreoretinal Surgeon [facility]

## 2021-10-23 NOTE — Pre-Procedure Instructions (Addendum)
Surgical Instructions                 Your procedure is scheduled on Thursday, October 26, 2021 at 11:30 AM.             Report to [facility] Main Entrance "A" at 9:30 A.M., then check in with the Admitting office.             Call this number if you have problems the morning of surgery:             [phone]    If you have any questions prior to your surgery date call [phone]: Open Monday-Friday 8am-4pm   *If you experience any cold or flu symptoms such as cough, fever, chills, shortness of breath, etc. between now and your scheduled surgery, please notify us.*                 Remember:             Do not eat after midnight the night before your surgery   You may drink clear liquids until 8:30 AM the morning of your surgery.   Clear liquids allowed are: Water, Non-Citrus Juices (without pulp), Carbonated Beverages, Clear Tea, Black Coffee Only (NO MILK, CREAM OR POWDERED CREAMER of any kind), and Gatorade.                          Take these medicines the morning of surgery with A SIP OF WATER:   atorvastatin (LIPITOR) PARoxetine (PAXIL) loperamide (IMODIUM A-D) - if needed   Follow your surgeon's instructions on when to stop Aspirin and apixaban (ELIQUIS).  If no instructions were given by your surgeon then you will need to call the office to get those instructions.       As of today, STOP taking any Aleve, Naproxen, Ibuprofen, Motrin, Advil, Goody's, BC's, all herbal medications, fish oil, and all vitamins.   WHAT DO I DO ABOUT MY DIABETES MEDICATION?     CONTACT YOUR ENDOCRINOLOGIST OR REDUCE YOUR BASAL RATE BY 20% (0.56) AT MIDNIGHT BEFORE YOUR SURGERY.     HOW TO MANAGE YOUR DIABETES BEFORE AND AFTER SURGERY   Why is it important to control my blood sugar before and after surgery? Improving blood sugar levels before and after surgery helps healing and can limit problems. A way of improving blood sugar control is eating a healthy diet by:  Eating less sugar  and carbohydrates  Increasing activity/exercise  Talking with your doctor about reaching your blood sugar goals High blood sugars (greater than 180 mg/dL) can raise your risk of infections and slow your recovery, so you will need to focus on controlling your diabetes during the weeks before surgery. Make sure that the doctor who takes care of your diabetes knows about your planned surgery including the date and location.   How do I manage my blood sugar before surgery? Check your blood sugar at least 4 times a day, starting 2 days before surgery, to make sure that the level is not too high or low.   Check your blood sugar the morning of your surgery when you wake up and every 2 hours until you get to the Short Stay unit.   If your blood sugar is less than 70 mg/dL, you will need to treat for low blood sugar: Do not take insulin. Treat a low blood sugar (less than 70 mg/dL) with  cup of clear juice (cranberry  or apple), 4 glucose tablets, OR glucose gel. Recheck blood sugar in 15 minutes after treatment (to make sure it is greater than 70 mg/dL). If your blood sugar is not greater than 70 mg/dL on recheck, call [phone] for further instructions. Report your blood sugar to the short stay nurse when you get to Short Stay.   If you are admitted to the hospital after surgery: Your blood sugar will be checked by the staff and you will probably be given insulin after surgery (instead of oral diabetes medicines) to make sure you have good blood sugar levels. The goal for blood sugar control after surgery is 80-180 mg/dL.                       Do NOT Smoke (Tobacco/Vaping) for 24 hours prior to your procedure.   If you use a CPAP at night, you may bring your mask/headgear for your overnight stay.   Contacts, glasses, piercing's, hearing aid's, dentures or partials may not be worn into surgery, please bring cases for these belongings.    For patients admitted to the hospital, discharge time  will be determined by your treatment team.   Patients discharged the day of surgery will not be allowed to drive home, and someone needs to stay with them for 24 hours.   SURGICAL WAITING ROOM VISITATION Patients having surgery or a procedure may have two support people in the waiting area. Visitors may stay in the waiting area during the procedure and switch out with other visitors if needed. Children under the age of 41 must have an adult accompany them who is not the patient. If the patient needs to stay at the hospital during part of their recovery, the visitor guidelines for inpatient rooms apply.   Please refer to the [facility] website for the visitor guidelines for Inpatients (after your surgery is over and you are in a regular room).      Special instructions:   [place]- Preparing For Surgery   Before surgery, you can play an important role. Because skin is not sterile, your skin needs to be as free of germs as possible. You can reduce the number of germs on your skin by washing with CHG (chlorahexidine gluconate) Soap before surgery.  CHG is an antiseptic cleaner which kills germs and bonds with the skin to continue killing germs even after washing.     Oral Hygiene is also important to reduce your risk of infection.  Remember - BRUSH YOUR TEETH THE MORNING OF SURGERY WITH YOUR REGULAR TOOTHPASTE   Please do not use if you have an allergy to CHG or antibacterial soaps. If your skin becomes reddened/irritated stop using the CHG.  Do not shave (including legs and underarms) for at least 48 hours prior to first CHG shower. It is OK to shave your face.   Please follow these instructions carefully.              Shower the NIGHT BEFORE SURGERY and the MORNING OF SURGERY   If you chose to wash your hair, wash your hair first as usual with your normal shampoo.   After you shampoo, rinse your hair and body thoroughly to remove the shampoo.   Use CHG Soap as you would any other  liquid soap. You can apply CHG directly to the skin and wash gently with a scrungie or a clean washcloth.    Apply the CHG Soap to your body ONLY FROM THE NECK DOWN.  Do not use on open wounds or open sores. Avoid contact with your eyes, ears, mouth and genitals (private parts). Wash Face and genitals (private parts)  with your normal soap.    Wash thoroughly, paying special attention to the area where your surgery will be performed.   Thoroughly rinse your body with warm water from the neck down.   DO NOT shower/wash with your normal soap after using and rinsing off the CHG Soap.   Pat yourself dry with a CLEAN TOWEL.   Wear CLEAN PAJAMAS to bed the night before surgery   Place CLEAN SHEETS on your bed the night before your surgery   DO NOT SLEEP WITH PETS.     Day of Surgery: Take a shower with CHG soap. Do not wear jewelry. Do not wear lotions, powders, perfumes/colognes, or deodorant. Do not shave 48 hours prior to surgery.  Men may shave face and neck. Do not bring valuables to the hospital.  [facility] is not responsible for any belongings or valuables. Wear Clean/Comfortable clothing the morning of surgery Do not apply any deodorants/lotions.   Remember to brush your teeth WITH YOUR REGULAR TOOTHPASTE.   Please read over the following fact sheets that you were given.   If you received a COVID test during your pre-op visit  it is requested that you wear a mask when out in public, stay away from anyone that may not be feeling well and notify your surgeon if you develop symptoms. If you have been in contact with anyone that has tested positive in the last 10 days please notify you surgeon.

## 2021-10-24 ENCOUNTER — Encounter: Payer: HMO | Attending: Physician Assistant | Admitting: Dietician

## 2021-10-24 ENCOUNTER — Other Ambulatory Visit: Payer: Self-pay | Admitting: Internal Medicine

## 2021-10-24 ENCOUNTER — Encounter (HOSPITAL_COMMUNITY): Payer: Self-pay | Admitting: Anesthesiology

## 2021-10-24 ENCOUNTER — Encounter (HOSPITAL_COMMUNITY): Payer: Self-pay | Admitting: Physician Assistant

## 2021-10-24 DIAGNOSIS — E1159 Type 2 diabetes mellitus with other circulatory complications: Secondary | ICD-10-CM | POA: Diagnosis present

## 2021-10-24 DIAGNOSIS — Z794 Long term (current) use of insulin: Secondary | ICD-10-CM | POA: Diagnosis present

## 2021-10-24 NOTE — Progress Notes (Signed)
Patient is here today with his mother.  His mother was present but was not part of the appointment and patient stated that she could not help him with his pump.  He needs to learn how to change his basal rate for eye surgery Thursday.  Instructed patient how to make this change.  Patient was able to demonstrate and pump put back in auto mode.  Pump: Omnipod 5 CGM:  Dexcom G6  He received a new PDM.  He is not sure why.  He does not have his Omnipod account password.  I discussed that I can obtain this from the rep and we could update the new PDM with his current settings but he wishes to wait to do this.  My contact information provided. Omnipod 24/7 number also provided.  Instructions provided to patient: Midnight before your surgery, reduce your basal rate by 20% per pre-surgery orders.  (From 0.7 units per hour to 0.55 units per hour)  To do this:  Put pump in manual mode Click on the 3 lines in the upper left corner Scroll down to Basal Programs and select Press EDIT Pause insulin and select next Select Basal Rate to be changed Scroll to 0.55 and press this number to select Next Review to make sure the setting is correct Select SAVE Restart insulin   After surgery, the basal setting will need to be changed back to 0.7 units per hour.  Follow the above steps to do this then resume AUTO mode.  Please call for any questions. Antonieta Iba, RD, LDN, CDCES

## 2021-10-24 NOTE — Patient Instructions (Signed)
Midnight before your surgery, reduce your basal rate by 20% per pre-surgery orders.  (From 0.7 units per hour to 0.55 units per hour)  To do this:  Put pump in manual mode Click on the 3 lines in the upper left corner Scroll down to Basal Programs and select Press EDIT Pause insulin and select next Select Basal Rate to be changed Scroll to 0.55 and press this number to select Next Review to make sure the setting is correct Select SAVE Restart insulin   After surgery, the basal setting will need to be changed back to 0.7 units per hour.  Follow the above steps to do this then resume AUTO mode.  Please call for any questions.

## 2021-10-24 NOTE — Anesthesia Preprocedure Evaluation (Deleted)
Anesthesia Evaluation    Airway        Dental   Pulmonary former smoker,           Cardiovascular hypertension,      Neuro/Psych    GI/Hepatic   Endo/Other  diabetes  Renal/GU      Musculoskeletal   Abdominal   Peds  Hematology   Anesthesia Other Findings   Reproductive/Obstetrics                             Anesthesia Physical Anesthesia Plan  ASA:   Anesthesia Plan:    Post-op Pain Management:    Induction:   PONV Risk Score and Plan:   Airway Management Planned:   Additional Equipment:   Intra-op Plan:   Post-operative Plan:   Informed Consent:   Plan Discussed with:   Anesthesia Plan Comments: (PAT note by Karoline Caldwell, PA-C: [place] [facility] for history of chronic diastolic heart failure, CAD s/p CABG x4 in 2003, HLD, HTN, PAD, paroxysmal A-fib.  Last seen by Dr. Audie Box 05/15/2021.  Per note, "-History of atrial fibrillation. Pulse is regular on examination. Does not appear to have any A-fib today. He does have a history of bradycardia. Not on any AV nodal agents. Would recommend no medications today. We will continue on Eliquis 5 mg twice daily. If he needs any surgery he can have this. He can complete greater than 4 METS."  IDDM 2 on OmniPod insulin pump followed by endocrinology.  Last A1c 7.8 on 10/23/2021.  Preop labs reviewed, consistent with uncontrolled IDDM 2, mild anemia hemoglobin 11.4, otherwise unremarkable.  EKG 07/17/2021: Sinus rhythm with 1st degree A-V block.  Rate 76. Left axis deviation. Pulmonary disease pattern. Nonspecific ST abnormality  TTE 02/15/2021: 1. Left ventricular ejection fraction, by estimation, is 55 to 60%. The  left ventricle has normal function. The left ventricle has no regional  wall motion abnormalities. There is mild left ventricular hypertrophy.  Left ventricular diastolic parameters  are consistent with Grade I  diastolic dysfunction (impaired relaxation).  2. Right ventricular systolic function is mildly reduced. The right  ventricular size is normal.  3. Left atrial size was mildly dilated.  4. The mitral valve is normal in structure. Mild mitral valve  regurgitation. No evidence of mitral stenosis.  5. The aortic valve is tricuspid. Aortic valve regurgitation is not  visualized. Aortic valve is calcified. In the parasternal long axis,  cannot rule out some mobility with question of small vegetation. Not noted  in other views. Clinical correlation  suggested.  6. The inferior vena cava is dilated in size with >50% respiratory  variability, suggesting right atrial pressure of 8 mmHg.  7. Left pleural effusion noted.   )        Anesthesia Quick Evaluation

## 2021-10-24 NOTE — Progress Notes (Signed)
Anesthesia Chart Review:  [place] cardiology for history of chronic diastolic heart failure, CAD s/p CABG x4 in 2003, HLD, HTN, PAD, paroxysmal A-fib.  Last seen by Dr. Audie Box 05/15/2021.  Per note, "-History of atrial fibrillation.  Pulse is regular on examination.  Does not appear to have any A-fib today.  He does have a history of bradycardia.  Not on any AV nodal agents.  Would recommend no medications today.  We will continue on Eliquis 5 mg twice daily.  If he needs any surgery he can have this.  He can complete greater than 4 METS."  IDDM 2 on OmniPod insulin pump followed by endocrinology.  Last A1c 7.8 on 10/23/2021.  Preop labs reviewed, consistent with uncontrolled IDDM 2, mild anemia hemoglobin 11.4, otherwise unremarkable.  EKG 07/17/2021: Sinus rhythm with 1st degree A-V block.  Rate 76. Left axis deviation. Pulmonary disease pattern. Nonspecific ST abnormality  TTE 02/15/2021:  1. Left ventricular ejection fraction, by estimation, is 55 to 60%. The  left ventricle has normal function. The left ventricle has no regional  wall motion abnormalities. There is mild left ventricular hypertrophy.  Left ventricular diastolic parameters  are consistent with Grade I diastolic dysfunction (impaired relaxation).   2. Right ventricular systolic function is mildly reduced. The right  ventricular size is normal.   3. Left atrial size was mildly dilated.   4. The mitral valve is normal in structure. Mild mitral valve  regurgitation. No evidence of mitral stenosis.   5. The aortic valve is tricuspid. Aortic valve regurgitation is not  visualized. Aortic valve is calcified. In the parasternal long axis,  cannot rule out some mobility with question of small vegetation. Not noted  in other views. Clinical correlation  suggested.   6. The inferior vena cava is dilated in size with >50% respiratory  variability, suggesting right atrial pressure of 8 mmHg.   7. Left pleural effusion noted.      Wynonia Musty [facility] Short Stay Center/Anesthesiology Phone [phone] 10/24/2021 12:15 PM

## 2021-10-27 ENCOUNTER — Encounter (INDEPENDENT_AMBULATORY_CARE_PROVIDER_SITE_OTHER): Payer: HMO | Admitting: Ophthalmology

## 2021-10-27 DIAGNOSIS — E113513 Type 2 diabetes mellitus with proliferative diabetic retinopathy with macular edema, bilateral: Secondary | ICD-10-CM

## 2021-10-27 DIAGNOSIS — H35033 Hypertensive retinopathy, bilateral: Secondary | ICD-10-CM

## 2021-10-27 DIAGNOSIS — H4312 Vitreous hemorrhage, left eye: Secondary | ICD-10-CM

## 2021-10-27 DIAGNOSIS — I10 Essential (primary) hypertension: Secondary | ICD-10-CM

## 2021-10-27 DIAGNOSIS — H25813 Combined forms of age-related cataract, bilateral: Secondary | ICD-10-CM

## 2021-11-02 ENCOUNTER — Other Ambulatory Visit: Payer: Self-pay | Admitting: Endocrinology

## 2021-11-02 DIAGNOSIS — E1042 Type 1 diabetes mellitus with diabetic polyneuropathy: Secondary | ICD-10-CM

## 2021-11-10 ENCOUNTER — Ambulatory Visit (INDEPENDENT_AMBULATORY_CARE_PROVIDER_SITE_OTHER): Payer: HMO | Admitting: Ophthalmology

## 2021-11-10 ENCOUNTER — Encounter (INDEPENDENT_AMBULATORY_CARE_PROVIDER_SITE_OTHER): Payer: Self-pay | Admitting: Ophthalmology

## 2021-11-10 DIAGNOSIS — I10 Essential (primary) hypertension: Secondary | ICD-10-CM | POA: Diagnosis not present

## 2021-11-10 DIAGNOSIS — H25813 Combined forms of age-related cataract, bilateral: Secondary | ICD-10-CM

## 2021-11-10 DIAGNOSIS — H35033 Hypertensive retinopathy, bilateral: Secondary | ICD-10-CM

## 2021-11-10 DIAGNOSIS — E113513 Type 2 diabetes mellitus with proliferative diabetic retinopathy with macular edema, bilateral: Secondary | ICD-10-CM | POA: Diagnosis not present

## 2021-11-10 DIAGNOSIS — H4312 Vitreous hemorrhage, left eye: Secondary | ICD-10-CM | POA: Diagnosis not present

## 2021-11-10 MED ORDER — BEVACIZUMAB CHEMO INJECTION 1.25MG/0.05ML SYRINGE FOR KALEIDOSCOPE
1.2500 mg | INTRAVITREAL | Status: AC | PRN
  Administered 2021-11-10: 1.25 mg via INTRAVITREAL

## 2021-11-10 NOTE — Progress Notes (Signed)
[facility] Note  11/10/2021     CHIEF COMPLAINT Patient presents for Retina Follow Up  HISTORY OF PRESENT ILLNESS: [name] [name] is a 66 y.o. male who presents to the clinic today for:   HPI     Retina Follow Up   Patient presents with  Diabetic Retinopathy.  In both eyes.  This started 5 weeks ago.  Duration of 5 weeks.  I, the attending physician,  performed the HPI with the patient and updated documentation appropriately.        Comments   Retina follow up exu eval pt states no vision changes noticed IVA OU       Last edited by [name], MD on 11/10/2021 12:32 PM.    Pt feels like his vision is worse today than it was last time he was here. Had surgery scheduled for OS on 10.26.23, then 11.9.23, but had to cancel x2 -- rescheduled to 11.30.23.  Referring physician: [name], [name], PA-C [address] [place],  [place] [address]  HISTORICAL INFORMATION:   Selected notes from the MEDICAL RECORD NUMBER Referred by Dr. [name] for PDR OU   CURRENT MEDICATIONS: No current outpatient medications on file. (Ophthalmic Drugs)   No current facility-administered medications for this visit. (Ophthalmic Drugs)   Current Outpatient Medications (Other)  Medication Sig   acetaminophen (TYLENOL) 500 MG tablet Take 1,000 mg by mouth every 6 (six) hours as needed for mild pain or moderate pain.   apixaban (ELIQUIS) 5 MG TABS tablet Take 1 tablet (5 mg total) by mouth 2 (two) times daily. PLEASE SCHEDULE APPT WITH ALYSSA FOR FURTHER REFILLS [phone] (Patient taking differently: Take 5 mg by mouth 2 (two) times daily.)   aspirin EC 81 MG EC tablet Take 1 tablet (81 mg total) by mouth daily. (Patient taking differently: Take 81 mg by mouth in the morning.)   atorvastatin (LIPITOR) 40 MG tablet TAKE 1 TABLET BY MOUTH EVERY DAY (Patient taking differently: Take 40 mg by mouth in the morning.)   BD PEN NEEDLE NANO 2ND GEN 32G X 4 MM  MISC USE FOR INSULIN   Blood Glucose Monitoring Suppl (ONETOUCH VERIO FLEX SYSTEM) w/Device KIT    Continuous Blood Gluc Sensor (DEXCOM G6 SENSOR) MISC 1 Device by Does not apply route See admin instructions. Change every 10 days   Continuous Blood Gluc Transmit (DEXCOM G6 TRANSMITTER) MISC Use as instructed to check blood sugar   furosemide (LASIX) 40 MG tablet TAKE 1 TABLET BY MOUTH EVERY DAY (Patient taking differently: Take 40 mg by mouth daily as needed for edema.)   Incontinence Supply Disposable (DISPOSABLE BRIEF LARGE) MISC 1 each by Does not apply route as needed.   insulin aspart (NOVOLOG FLEXPEN) 100 UNIT/ML FlexPen Inject 4 Units into the skin 2 (two) times daily. When pump is not attached.   insulin aspart (NOVOLOG) 100 UNIT/ML injection Inject 50 Units into the skin See admin instructions. Pt loads pump with 50 units every 3 days   Insulin Disposable Pump (OMNIPOD 5 G6 INTRO, GEN 5,) KIT 1 Device by Does not apply route every 3 (three) days.   Insulin Disposable Pump (OMNIPOD 5 G6 POD, GEN 5,) MISC APPLY EVERY 3 (THREE) DAYS.   Lancets (ONETOUCH DELICA PLUS [id number]) MISC Apply 1 each topically 4 (four) times daily.   lidocaine (XYLOCAINE) 5 % ointment Apply 1 Application topically as needed. (Patient not taking: Reported on 10/23/2021)   losartan (COZAAR) 25 MG  tablet TAKE 1 TABLET BY MOUTH EVERY DAY (Patient taking differently: Take 25 mg by mouth in the morning.)   mirtazapine (REMERON) 15 MG tablet Take 1 tablet (15 mg total) by mouth at bedtime.   ONETOUCH VERIO test strip 1 each 4 (four) times daily.   PARoxetine (PAXIL) 20 MG tablet TAKE 1 TABLET BY MOUTH EVERY DAY (Patient taking differently: Take 20 mg by mouth in the morning.)   spironolactone (ALDACTONE) 25 MG tablet TAKE 1 TABLET BY MOUTH EVERY DAY (Patient taking differently: Take 25 mg by mouth in the morning.)   Current Facility-Administered Medications (Other)  Medication Route   diclofenac Sodium (VOLTAREN) 1 %  topical gel 2 g Topical   REVIEW OF SYSTEMS: ROS   Positive for: Endocrine, Eyes Last edited by [name], MD on 11/10/2021 12:32 PM.      ALLERGIES Allergies  Allergen Reactions   Wool Alcohol [Lanolin] Hives    Wool fabric    PAST MEDICAL HISTORY Past Medical History:  Diagnosis Date   Anxiety    Arthritis    Cataract    CHF (congestive heart failure) ([place])    Constipation    pt states goes EOD- hard stools    Coronary artery disease    Depression    Diabetes mellitus without complication (HCC)    Diabetic retinopathy ([place])    DKA (diabetic ketoacidoses)    Hyperlipidemia    Hypertension    off meds   Hypertensive retinopathy    Postherpetic neuralgia 08/11/2021   Speculative diagnosis   Psoriasis    Tubular adenoma of colon 03/2019   Past Surgical History:  Procedure Laterality Date   ABDOMINAL AORTOGRAM W/LOWER EXTREMITY N/A 08/30/2020   Procedure: ABDOMINAL AORTOGRAM W/LOWER EXTREMITY;  Surgeon: Serafina Mitchell, MD;  Location: [facility];  Service: Cardiovascular;  Laterality: N/A;   ABDOMINAL AORTOGRAM W/LOWER EXTREMITY N/A 11/15/2020   Procedure: ABDOMINAL AORTOGRAM W/LOWER EXTREMITY;  Surgeon: Serafina Mitchell, MD;  Location: [facility];  Service: Cardiovascular;  Laterality: N/A;   ANKLE SURGERY     right   CARDIAC CATHETERIZATION  09/25/2001   COLONOSCOPY     ~10 yr ago- normal  per pt    CORONARY ARTERY BYPASS GRAFT  09/30/2001   CABG x 4   IR THORACENTESIS ASP PLEURAL SPACE W/IMG GUIDE  05/19/2020   LAPAROSCOPIC INGUINAL HERNIA REPAIR Bilateral 02/09/2010   MASS EXCISION Left 03/12/2013   Procedure: LEFT INDEX EXCISION MASS AND DEBRIDMENT DISTAL INTERPHALANGEAL JOINT;  Surgeon: Tennis Must, MD;  Location: [facility];  Service: Orthopedics;  Laterality: Left;   PERIPHERAL VASCULAR BALLOON ANGIOPLASTY Left 08/30/2020   Procedure: PERIPHERAL VASCULAR BALLOON ANGIOPLASTY;  Surgeon: Serafina Mitchell, MD;  Location: [facility];  Service: Cardiovascular;  Laterality: Left;  Peroneal   FAMILY HISTORY Family History  Problem Relation Age of Onset   Lymphoma Father    Cancer Paternal Grandmother        Colon Cancer   Colon cancer Paternal Grandmother    Colon polyps Neg Hx    Esophageal cancer Neg Hx    Rectal cancer Neg Hx    Stomach cancer Neg Hx    Diabetes Mellitus I Neg Hx    Pancreatic cancer Neg Hx    SOCIAL HISTORY Social History   Tobacco Use   Smoking status: Former    Types: Cigars    Quit date: 2022    Years since quitting: 1.8  Smokeless tobacco: Never  Vaping Use   Vaping Use: Never used  Substance Use Topics   Alcohol use: Yes    Comment: about once a year   Drug use: No       OPHTHALMIC EXAM: Base Eye Exam     Visual Acuity (Snellen - Linear)       Right Left   Dist [place] 20/70 CF at 3'   Dist ph  20/60 20/300         Tonometry (Tonopen, 8:08 AM)       Right Left   Pressure 13 15         Pupils       Dark Light Shape React APD   Right 4 3 Round Sluggish None   Left 4 3 Round Sluggish None         Visual Fields       Left Right   Restrictions Partial outer superior nasal, inferior nasal deficiencies          Extraocular Movement       Right Left    Full, Ortho Full, Ortho         Neuro/Psych     Oriented x3: Yes   Mood/Affect: Normal         Dilation     Both eyes: 2.5% Phenylephrine @ 8:08 AM           Slit Lamp and Fundus Exam     External Exam       Right Left   External Normal Normal         Slit Lamp Exam       Right Left   Lids/Lashes Dermatochalasis - upper lid, Meibomian gland dysfunction Dermatochalasis - upper lid, Meibomian gland dysfunction   Conjunctiva/Sclera White and quiet White and quiet   Cornea arcus, 2-3+ Punctate epithelial erosions arcus, 1-2+ Punctate epithelial erosions, tear film debris   Anterior Chamber Deep and quiet Deep and quiet   Iris Round and dilated, No NVI Round and  dilated, No NVI   Lens 2-3+ Nuclear sclerosis with brunescence, 2+ Cortical cataract 2-3+ Nuclear sclerosis with brunescence, 2-3+ Cortical cataract   Anterior Vitreous Vitreous syneresis, blood stained vitreous condensations -- improved / resolved Vitreous syneresis, diffuse VH - improved, white blood stained vitreous condensations, pre-retinal heme white and settled inferiorly         Fundus Exam       Right Left   Disc Pink and Sharp, NVD - regressed hazy view, Compact, fibrosis, mild Pallor   C/D Ratio 0.2 not visible   Macula Flat, good foveal reflex, scatterd DBH greatest temporally, cystic changes/edema temporal fovea and macula -- stably improved hazy view w/ residual VH and fibrosis, +traction, Blunted foveal reflex, grossly flat   Vessels +NVE just inside ST arcades - improved, +fibrosis, attenuated, Tortuous Severe attenuation, Tortuous, +NVD; NVE along ST arcades - regressing, fibrosis   Periphery Attached; scattered MA/DBH, good 360 PRP with room for fill in Hazy view, scattered fibrosis, grossly attached, scattered PRP visible, scattered MA/DBH           Refraction     Wearing Rx       Sphere Cylinder Axis Add   Right +0.25 +2.00 180 +2.50   Left +0.50 +1.75 009 +2.50           IMAGING AND PROCEDURES  Imaging and Procedures for 11/10/2021  OCT, Retina - OU - Both Eyes       Right Eye Quality  was good. Central Foveal Thickness: 234. Progression has been stable. Findings include no IRF, no SRF, abnormal foveal contour, intraretinal hyper-reflective material, vitreous traction, preretinal fibrosis (Partial PVD, stable improvement in cystic changes temporal macula and fovea, persistent PRF with traction).   Left Eye Quality was poor. Central Foveal Thickness: 557. Progression has no prior data. Findings include no SRF, abnormal foveal contour, intraretinal hyper-reflective material, intraretinal fluid, vitreous traction (interval increase in vitreous opacities,  persistent vitreous traction greatest along proximal arcades and disc).   Notes *Images captured and stored on drive  Diagnosis / Impression:  PDR with DME OU NG:[id number] PVD, stable improvement in cystic changes temporal macula and fovea, persistent PRF with traction OS: interval increase in vitreous opacities, persistent vitreous traction greatest along proximal arcades and disc  Clinical management:  See below  Abbreviations: NFP - Normal foveal profile. CME - cystoid macular edema. PED - pigment epithelial detachment. IRF - intraretinal fluid. SRF - subretinal fluid. EZ - ellipsoid zone. ERM - epiretinal membrane. ORA - outer retinal atrophy. ORT - outer retinal tubulation. SRHM - subretinal hyper-reflective material. IRHM - intraretinal hyper-reflective material      Intravitreal Injection, Pharmacologic Agent - OD - Right Eye       Time Out 11/10/2021. 8:48 AM. Confirmed correct patient, procedure, site, and patient consented.   Anesthesia Topical anesthesia was used. Anesthetic medications included Lidocaine 2%, Proparacaine 0.5%.   Procedure Preparation included 5% betadine to ocular surface, eyelid speculum. A supplied (32g) needle was used.   Injection: 1.25 mg Bevacizumab 1.93m/0.05ml   Route: Intravitreal, Site: Right Eye   NDC: 50242-060-01, Lot:: [id number] Expiration date: 12/25/2021   Post-op Post injection exam found visual acuity of at least counting fingers. The patient tolerated the procedure well. There were no complications. The patient received written and verbal post procedure care education. Post injection medications were not given.      Intravitreal Injection, Pharmacologic Agent - OS - Left Eye       Time Out 11/10/2021. 8:48 AM. Confirmed correct patient, procedure, site, and patient consented.   Anesthesia Topical anesthesia was used. Anesthetic medications included Lidocaine 2%, Proparacaine 0.5%.   Procedure Preparation included 5%  betadine to ocular surface, eyelid speculum. A (32 g) needle was used.   Injection: 1.25 mg Bevacizumab 1.224m0.05ml   Route: Intravitreal, Site: Left Eye   NDC: : 27253-664-40Lot: : [id number] date: 11/29/2021   Post-op Post injection exam found visual acuity of at least counting fingers. The patient tolerated the procedure well. There were no complications. The patient received written and verbal post procedure care education. Post injection medications were not given.            ASSESSMENT/PLAN:    ICD-10-CM   1. Proliferative diabetic retinopathy of both eyes with macular edema associated with type 2 diabetes mellitus (HCC)  E11.3513 OCT, Retina - OU - Both Eyes    Intravitreal Injection, Pharmacologic Agent - OD - Right Eye    Intravitreal Injection, Pharmacologic Agent - OS - Left Eye    Bevacizumab (AVASTIN) SOLN 1.25 mg    Bevacizumab (AVASTIN) SOLN 1.25 mg    2. Vitreous hemorrhage of left eye (HCC)  H43.12     3. Essential hypertension  I10     4. Hypertensive retinopathy of both eyes  H35.033     5. Combined forms of age-related cataract of both eyes  H25.813      1. Severe proliferative diabetic retinopathy, both eyes  - last A1c 9.7 on  08.18.23; 9.2 on 4.20.23; A1c >15% on 10.11.22 - s/p PRP OS (11.10.22)  - s/p PRP OD (12.02.22)  - s/p IVA OS #1 (01.18.23) for VH, #2 (03.17.23), #3 (04.14.23), #4 (05.12.23), #5 (06.09.23), #6 (07.07.23), #7 (08.10.23), #8 (09.08.23), #9 (10.06.23)  - s/p IVA OD #1 (01.27.23) for macular edema/NVD, #2 (03.17.23), #3 (04.14.23), #4 (05.12.23), #5 (06.09.23), #6 (07.07.23), #7 (08.10.23), #8 (09.08.23), #9 (10.06.23) - pt reports improved BG control -- on insulin pump - exam shows extensive neovascularization, fibrosis and hemorrhage OU (OS >>> OD)  - OD with improved NVD / VH - OS with persistent VH - BCVA 20/60 OD; 20/300 OS -- worse OU - FA (11.09.22) shows areas of vascular non-perfusion OU, +NVD OU and +NVE OU -  OCT shows OD: stable improvement in cystic changes temporal macula and fovea, persistent PRF with traction; OS: interval increase in vitreous opacities, cpersistent vitreous traction greatest along proximal arcades and disc - discussed findings, severity of his disease, prognosis and likely need for extensive treatments -- PRP OU, multiple rounds of anti-VEGF therapy OU and possible surgery - recommend IVA OU #10 today, 11.10.23 for PDR OU - pt wishes to proceed with injections - RBA of procedure discussed, questions answered - informed consent obtained and signed, 01.18.23 - see procedure note - f/u December 1, DFE/POV  2. Vitreous hemorrhage OS  - acute increase in VH OS (late Dec 2022) per pt report  - s/p multiple IVA OS as above  - VH remains present and now with increasing vitreous traction and fibrosis  - recommend PPV w/ membrane peel OS under general anesthesia - pt wishes to proceed with surgery - surgery scheduled for Thursday, November 30, 11:30 am, [facility] OR 8 - f/u Dec. 1 -- POD1  3,4. Hypertensive retinopathy OU - discussed importance of tight BP control - monitor   5. Mixed Cataract OU - The symptoms of cataract, surgical options, and treatments and risks were discussed with patient - discussed diagnosis and progression - monitor   Ophthalmic Meds Ordered this visit:  Meds ordered this encounter  Medications   Bevacizumab (AVASTIN) SOLN 1.25 mg   Bevacizumab (AVASTIN) SOLN 1.25 mg     Return in about 3 weeks (around 12/01/2021) for f/u PDR OU, POV.  There are no Patient Instructions on file for this visit.  Explained the diagnoses, plan, and follow up with the patient and they expressed understanding.  Patient expressed understanding of the importance of proper follow up care.   This document serves as a record of services personally performed by [name], MD, PhD. It was created on their behalf by San Jetty. Owens Shark, OA an ophthalmic technician. The creation of  this record is the provider's dictation and/or activities during the visit.    Electronically signed by: San Jetty. Owens Shark, [place] 11.10.2023  12:33 PM   [name], M.D., Ph.D. Diseases & Surgery of the Retina and Vitreous [facility]  I have reviewed the above documentation for accuracy and completeness, and I agree with the above. [name], M.D., Ph.D. 11/10/21 12:39 PM  Abbreviations: M myopia (nearsighted); A astigmatism; H hyperopia (farsighted); P presbyopia; Mrx spectacle prescription;  CTL contact lenses; OD right eye; OS left eye; OU both eyes  XT exotropia; ET esotropia; PEK punctate epithelial keratitis; PEE punctate epithelial erosions; DES dry eye syndrome; MGD meibomian gland dysfunction; ATs artificial tears; PFAT's preservative free artificial tears; [place] nuclear sclerotic cataract; PSC posterior subcapsular cataract; ERM epi-retinal  membrane; PVD posterior vitreous detachment; RD retinal detachment; DM diabetes mellitus; DR diabetic retinopathy; NPDR non-proliferative diabetic retinopathy; PDR proliferative diabetic retinopathy; CSME clinically significant macular edema; DME diabetic macular edema; dbh dot blot hemorrhages; CWS cotton wool spot; POAG primary open angle glaucoma; C/D cup-to-disc ratio; HVF humphrey visual field; GVF goldmann visual field; OCT optical coherence tomography; IOP intraocular pressure; BRVO Branch retinal vein occlusion; CRVO central retinal vein occlusion; CRAO central retinal artery occlusion; BRAO branch retinal artery occlusion; RT retinal tear; SB scleral buckle; PPV pars plana vitrectomy; VH Vitreous hemorrhage; PRP panretinal laser photocoagulation; IVK intravitreal kenalog; VMT vitreomacular traction; MH Macular hole;  NVD neovascularization of the disc; NVE neovascularization elsewhere; AREDS age related eye disease study; ARMD age related macular degeneration; POAG primary open angle glaucoma; EBMD epithelial/anterior  basement membrane dystrophy; ACIOL anterior chamber intraocular lens; IOL intraocular lens; PCIOL posterior chamber intraocular lens; Phaco/IOL phacoemulsification with intraocular lens placement; [place] photorefractive keratectomy; LASIK laser assisted in situ keratomileusis; HTN hypertension; DM diabetes mellitus; COPD chronic obstructive pulmonary disease

## 2021-11-14 ENCOUNTER — Ambulatory Visit: Payer: HMO | Attending: Cardiovascular Disease | Admitting: Nurse Practitioner

## 2021-11-14 NOTE — Progress Notes (Deleted)
 Office Visit    Patient Name: [name] [name] Date of Encounter: 11/14/2021  Primary Care Provider:  [name], [name], PA-C Primary Cardiologist:  [place] T O'Neal, MD  Chief Complaint    66-year-old male with a  history of CAD s/p CABG x4 in 2003, chronic diastolic heart failure, paroxysmal atrial fibrillation, hypertension, hyperlipidemia, PAD, AAA, pleural effusion, and type 1 diabetes with diabetic foot ulcers who presents for follow-up related to CAD and atrial fibrillation.   Past Medical History    Past Medical History:  Diagnosis Date   Anxiety    Arthritis    Cataract    CHF (congestive heart failure) (HCC)    Constipation    pt states goes EOD- hard stools    Coronary artery disease    Depression    Diabetes mellitus without complication (HCC)    Diabetic retinopathy (HCC)    DKA (diabetic ketoacidoses)    Hyperlipidemia    Hypertension    off meds   Hypertensive retinopathy    Postherpetic neuralgia 08/11/2021   Speculative diagnosis   Psoriasis    Tubular adenoma of colon 03/2019   Past Surgical History:  Procedure Laterality Date   ABDOMINAL AORTOGRAM W/LOWER EXTREMITY N/A 08/30/2020   Procedure: ABDOMINAL AORTOGRAM W/LOWER EXTREMITY;  Surgeon: Brabham, Vance W, MD;  [facility]: [facility];  Service: Cardiovascular;  Laterality: N/A;   ABDOMINAL AORTOGRAM W/LOWER EXTREMITY N/A 11/15/2020   Procedure: ABDOMINAL AORTOGRAM W/LOWER EXTREMITY;  Surgeon: Brabham, Vance W, MD;  Location: [facility];  Service: Cardiovascular;  Laterality: N/A;   ANKLE SURGERY     right   CARDIAC CATHETERIZATION  09/25/2001   COLONOSCOPY     ~10 yr ago- normal  per pt    CORONARY ARTERY BYPASS GRAFT  09/30/2001   CABG x 4   IR THORACENTESIS ASP PLEURAL SPACE W/IMG GUIDE  05/19/2020   LAPAROSCOPIC INGUINAL HERNIA REPAIR Bilateral 02/09/2010   MASS EXCISION Left 03/12/2013   Procedure: LEFT INDEX EXCISION MASS AND DEBRIDMENT DISTAL INTERPHALANGEAL JOINT;   Surgeon: Kevin R Kuzma, MD;  Location: [facility];  Service: Orthopedics;  Laterality: Left;   PERIPHERAL VASCULAR BALLOON ANGIOPLASTY Left 08/30/2020   Procedure: PERIPHERAL VASCULAR BALLOON ANGIOPLASTY;  Surgeon: Brabham, Vance W, MD;  Location: [facility];  Service: Cardiovascular;  Laterality: Left;  Peroneal    Allergies  Allergies  Allergen Reactions   Wool Alcohol [Lanolin] Hives    Wool fabric    History of Present Illness    66-year-old male with the above past medical history including CAD s/p CABG x4 in 2003, chronic diastolic heart failure, paroxysmal atrial fibrillation, hypertension, hyperlipidemia, PAD, AAA,  pleural effusion and type 1 diabetes with diabetic foot ulcers.  He has a history of prior CABG in 2003.  Per Dr. O'Neal, there was mention in the notes of aortic dissection repair at the time of CABG, however, there was no additional documentation for this.  Has a history of PAD s/p intervention to the right posterior tibial artery in 11/2020, as well as prior intervention to left peroneal artery, follows with vascular surgery.  He has a history of paroxysmal atrial fibrillation with bradycardia, on Eliquis (this was diagnosed by his PCP).  He was hospitalized in 02/2021 in the setting of acute CHF/atrial fibrillation. Cardiology was consulted. Prior to his hospitalization, he had not seen a cardiologist for several years.  Chest x-ray showed bilateral pleural effusions.  He was diuresed with IV Lasix.    Echocardiogram at the time showed EF 55 to 60%, normal LV function, no RWMA, mild LVH, G1, normal RV systolic function, mild mitral valve regurgitation.  He was last seen in the office on 05/15/2021 and was stable from a cardiac standpoint.  He was maintaining NSR.  Follow-up with vascular surgery was advised.  He presents today for follow-up. Since his last visit   CAD: Chronic diastolic heart failure: Paroxysmal atrial  fibrillation: Hypertension: Hyperlipidemia: PAD: AAA:  Type 1 diabetes: Disposition:  Home Medications    Current Outpatient Medications  Medication Sig Dispense Refill   acetaminophen (TYLENOL) 500 MG tablet Take 1,000 mg by mouth every 6 (six) hours as needed for mild pain or moderate pain.     apixaban (ELIQUIS) 5 MG TABS tablet Take 1 tablet (5 mg total) by mouth 2 (two) times daily. PLEASE SCHEDULE APPT WITH [facility] FOR FURTHER REFILLS [phone] (Patient taking differently: Take 5 mg by mouth 2 (two) times daily.) 60 tablet 0   aspirin EC 81 MG EC tablet Take 1 tablet (81 mg total) by mouth daily. (Patient taking differently: Take 81 mg by mouth in the morning.) 30 tablet 0   atorvastatin (LIPITOR) 40 MG tablet TAKE 1 TABLET BY MOUTH EVERY DAY (Patient taking differently: Take 40 mg by mouth in the morning.) 90 tablet 1   BD PEN NEEDLE NANO 2ND GEN 32G X 4 MM MISC USE FOR INSULIN 300 each 1   Blood Glucose Monitoring Suppl (ONETOUCH VERIO FLEX SYSTEM) w/Device KIT      Continuous Blood Gluc Sensor (DEXCOM G6 SENSOR) MISC 1 Device by Does not apply route See admin instructions. Change every 10 days 9 each 3   Continuous Blood Gluc Transmit (DEXCOM G6 TRANSMITTER) MISC Use as instructed to check blood sugar 1 each 1   furosemide (LASIX) 40 MG tablet TAKE 1 TABLET BY MOUTH EVERY DAY (Patient taking differently: Take 40 mg by mouth daily as needed for edema.) 90 tablet 1   Incontinence Supply Disposable (DISPOSABLE BRIEF LARGE) MISC 1 each by Does not apply route as needed. 36 each 5   insulin aspart (NOVOLOG FLEXPEN) 100 UNIT/ML FlexPen Inject 4 Units into the skin 2 (two) times daily. When pump is not attached.     insulin aspart (NOVOLOG) 100 UNIT/ML injection Inject 50 Units into the skin See admin instructions. Pt loads pump with 50 units every 3 days 20 mL 1   Insulin Disposable Pump (OMNIPOD 5 G6 INTRO, GEN 5,) KIT 1 Device by Does not apply route every 3 (three) days. 30 kit 3    Insulin Disposable Pump (OMNIPOD 5 G6 POD, GEN 5,) MISC APPLY EVERY 3 (THREE) DAYS. 10 each 3   Lancets (ONETOUCH DELICA PLUS [id number]) MISC Apply 1 each topically 4 (four) times daily.     lidocaine (XYLOCAINE) 5 % ointment Apply 1 Application topically as needed. (Patient not taking: Reported on 10/23/2021) 35.44 g 0   losartan (COZAAR) 25 MG tablet TAKE 1 TABLET BY MOUTH EVERY DAY (Patient taking differently: Take 25 mg by mouth in the morning.) 90 tablet 1   mirtazapine (REMERON) 15 MG tablet Take 1 tablet (15 mg total) by mouth at bedtime. 90 tablet 1   ONETOUCH VERIO test strip 1 each 4 (four) times daily.     PARoxetine (PAXIL) 20 MG tablet TAKE 1 TABLET BY MOUTH EVERY DAY (Patient taking differently: Take 20 mg by mouth in the morning.) 90 tablet 1   spironolactone (ALDACTONE) 25 MG tablet TAKE 1  TABLET BY MOUTH EVERY DAY (Patient taking differently: Take 25 mg by mouth in the morning.) 90 tablet 1   Current Facility-Administered Medications  Medication Dose Route Frequency Provider Last Rate Last Admin   diclofenac Sodium (VOLTAREN) 1 % topical gel 2 g  2 g Topical QID Loralee Pacas, MD         Review of Systems    ***.  All other systems reviewed and are otherwise negative except as noted above.    Physical Exam    VS:  There were no vitals taken for this visit. , BMI There is no height or weight on file to calculate BMI.     GEN: Well nourished, well developed, in no acute distress. HEENT: normal. Neck: Supple, no JVD, carotid bruits, or masses. Cardiac: RRR, no murmurs, rubs, or gallops. No clubbing, cyanosis, edema.  Radials/DP/PT 2+ and equal bilaterally.  Respiratory:  Respirations regular and unlabored, clear to auscultation bilaterally. GI: Soft, nontender, nondistended, BS + x 4. MS: no deformity or atrophy. Skin: warm and dry, no rash. Neuro:  Strength and sensation are intact. Psych: Normal affect.  Accessory Clinical Findings    ECG personally reviewed by  me today - *** - no acute changes.   Lab Results  Component Value Date   WBC 6.4 10/23/2021   HGB 11.4 (L) 10/23/2021   HCT 34.7 (L) 10/23/2021   MCV 92.3 10/23/2021   PLT 196 10/23/2021   Lab Results  Component Value Date   CREATININE 0.73 10/23/2021   BUN 13 10/23/2021   NA 136 10/23/2021   K 3.5 10/23/2021   CL 103 10/23/2021   CO2 28 10/23/2021   Lab Results  Component Value Date   ALT 13 10/23/2021   AST 18 10/23/2021   ALKPHOS 91 10/23/2021   BILITOT 0.5 10/23/2021   Lab Results  Component Value Date   CHOL 78 02/16/2021   HDL 39 (L) 02/16/2021   LDLCALC 34 02/16/2021   LDLDIRECT 37.0 03/16/2020   TRIG 26 02/16/2021   CHOLHDL 2.0 02/16/2021    Lab Results  Component Value Date   HGBA1C 7.8 (H) 10/23/2021    Assessment & Plan    1.  ***  No BP recorded.  {Refresh Note OR Click here to enter BP  :1}***   Lenna Sciara, NP 11/14/2021, 4:50 AM

## 2021-11-15 ENCOUNTER — Telehealth: Payer: Self-pay | Admitting: Internal Medicine

## 2021-11-15 NOTE — Telephone Encounter (Signed)
Does patient needs this appointment to meter adjustment before surgery

## 2021-11-15 NOTE — Telephone Encounter (Signed)
Patient is having retinal surgery and has been advised by Retina Specialist to see Endocrinologist before surgery for health review and specifically meter adjustments for night before surgery.  Appointment made for Fri 11/17/21 @ 750 AM at patient's (mother) insistence.

## 2021-11-17 ENCOUNTER — Encounter: Payer: Self-pay | Admitting: Internal Medicine

## 2021-11-17 ENCOUNTER — Ambulatory Visit (INDEPENDENT_AMBULATORY_CARE_PROVIDER_SITE_OTHER): Payer: HMO | Admitting: Internal Medicine

## 2021-11-17 VITALS — BP 136/84 | HR 62 | Ht 74.0 in | Wt 165.0 lb

## 2021-11-17 DIAGNOSIS — E1159 Type 2 diabetes mellitus with other circulatory complications: Secondary | ICD-10-CM | POA: Diagnosis not present

## 2021-11-17 DIAGNOSIS — E1142 Type 2 diabetes mellitus with diabetic polyneuropathy: Secondary | ICD-10-CM | POA: Diagnosis not present

## 2021-11-17 DIAGNOSIS — Z794 Long term (current) use of insulin: Secondary | ICD-10-CM

## 2021-11-17 DIAGNOSIS — E113593 Type 2 diabetes mellitus with proliferative diabetic retinopathy without macular edema, bilateral: Secondary | ICD-10-CM | POA: Diagnosis not present

## 2021-11-17 NOTE — Patient Instructions (Addendum)
The night before your surgery at midnight, Go to Switch Mode and change to " manual Mode" , than go " Set temp basal" , at the bottom of the page click on "Select From Presets" , click on Surgery rate " , then click on " Start". Than go back to " switch mode" and click on "Automated "     HOW TO TREAT LOW BLOOD SUGARS (Blood sugar LESS THAN 70 MG/DL) Please follow the RULE OF 15 for the treatment of hypoglycemia treatment (when your (blood sugars are less than 70 mg/dL)   STEP 1: Take 15 grams of carbohydrates when your blood sugar is low, which includes:  3-4 GLUCOSE TABS  OR 3-4 OZ OF JUICE OR REGULAR SODA OR ONE TUBE OF GLUCOSE GEL    STEP 2: RECHECK blood sugar in 15 MINUTES STEP 3: If your blood sugar is still low at the 15 minute recheck --> then, go back to STEP 1 and treat AGAIN with another 15 grams of carbohydrates.

## 2021-11-17 NOTE — Progress Notes (Signed)
Name: [name] [name]  Age/ Sex: 66 y.o., male   MRN/ DOB: [id number], 09/22/1955     PCP: Fredirick Lathe, PA-C   Reason for Endocrinology Evaluation: Type 2 Diabetes Mellitus  Initial Endocrine Consultative Visit: 07/15/2020    PATIENT IDENTIFIER: [name] is a 66 y.o. male with a past medical history of DM, CAD, HTN, PVD, PAF. The patient has followed with Endocrinology clinic since 07/15/2020 for consultative assistance with management of his diabetes.  DIABETIC HISTORY:  [name] was diagnosed with DM in 2011, and started insulin therapy in 2013. His hemoglobin A1c has ranged from 9.2% in 2023, peaking at >15.0% in 2022.  Saw Dr. Loanne Drilling last in 04/2021 SUBJECTIVE:   During the last visit (08/18/2021): A1c 9.7%  Today (11/17/2021): Mr. [name] is here for a follow up on diabetes management.  He was recently seen by ophthalmology and was requested to evaluate his pump settings pending surgical intervention of the left eye due to vitreous hemorrhage.  He checks his blood sugars multiple  times daily. The patient has not had hypoglycemic episodes since the last clinic visit.   Denies nausea, vomiting  Chronic diarrhea is improving  Left eye sx pending 11/30   This patient with type 2 diabetes is treated with Omnipod  (insulin pump). During the visit the pump basal and bolus doses were reviewed including carb/insulin rations and supplemental doses. The clinical list was updated. The glucose meter download was reviewed in detail to determine if the current pump settings are providing the best glycemic control without excessive hypoglycemia.  Pump and meter download:  Pump   Omnipod Settings   Insulin type   Novolog    Basal rate       0000 0.7              I:C ratio       0000 1:1                  Sensitivity       0000  60      Goal       0000  120             Type & Model of Pump: Omnipod  Insulin Type: Currently using Novolog  .    PUMP STATISTICS: Average BG: 273 Average Daily Carbs (g): 6.6 Average Total Daily Insulin: 14.7 Average Daily Basal: 11.4 (78 %) Average Daily Bolus: 3.3 (22 %)        HOME DIABETES REGIMEN:  Novolog   Statin: yes ACE-I/ARB: yes    CONTINUOUS GLUCOSE MONITORING RECORD INTERPRETATION    Dates of Recording: 11/4-11/17/2023  Sensor description:dexcom  Results statistics:   CGM use % of time 55.6  Average and SD 273  Time in range 17 %  % Time Above 180 23  % Time above 250 59  % Time Below target 1      Glycemic patterns summary: Hyperglycemia noted during the day and night   Hyperglycemic episodes  all day ad night   Hypoglycemic episodes occurred at night   Overnight periods: variable      DIABETIC COMPLICATIONS: Microvascular complications:  Retinopathy B/L , blind in left eye  Denies: CKD Last Eye Exam: Completed 2023  Macrovascular complications:  CAD (S/P CABG) Denies: CVA,   HISTORY:  Past Medical History:  Past Medical History:  Diagnosis Date   Anxiety    Arthritis    Cataract    CHF (congestive heart  failure) ([place])    Constipation    pt states goes EOD- hard stools    Coronary artery disease    Depression    Diabetes mellitus without complication (HCC)    Diabetic retinopathy ([place])    DKA (diabetic ketoacidoses)    Hyperlipidemia    Hypertension    off meds   Hypertensive retinopathy    Postherpetic neuralgia 08/11/2021   Speculative diagnosis   Psoriasis    Tubular adenoma of colon 03/2019   Past Surgical History:  Past Surgical History:  Procedure Laterality Date   ABDOMINAL AORTOGRAM W/LOWER EXTREMITY N/A 08/30/2020   Procedure: ABDOMINAL AORTOGRAM W/LOWER EXTREMITY;  Surgeon: Serafina Mitchell, MD;  Location: [facility];  Service: Cardiovascular;  Laterality: N/A;   ABDOMINAL AORTOGRAM W/LOWER EXTREMITY N/A 11/15/2020   Procedure: ABDOMINAL AORTOGRAM W/LOWER EXTREMITY;  Surgeon: Serafina Mitchell, MD;   Location: [facility];  Service: Cardiovascular;  Laterality: N/A;   ANKLE SURGERY     right   CARDIAC CATHETERIZATION  09/25/2001   COLONOSCOPY     ~10 yr ago- normal  per pt    CORONARY ARTERY BYPASS GRAFT  09/30/2001   CABG x 4   IR THORACENTESIS ASP PLEURAL SPACE W/IMG GUIDE  05/19/2020   LAPAROSCOPIC INGUINAL HERNIA REPAIR Bilateral 02/09/2010   MASS EXCISION Left 03/12/2013   Procedure: LEFT INDEX EXCISION MASS AND DEBRIDMENT DISTAL INTERPHALANGEAL JOINT;  Surgeon: Tennis Must, MD;  Location: [facility];  Service: Orthopedics;  Laterality: Left;   PERIPHERAL VASCULAR BALLOON ANGIOPLASTY Left 08/30/2020   Procedure: PERIPHERAL VASCULAR BALLOON ANGIOPLASTY;  Surgeon: Serafina Mitchell, MD;  Location: [facility];  Service: Cardiovascular;  Laterality: Left;  Peroneal   Social History:  reports that he quit smoking about 22 months ago. His smoking use included cigars. He has never used smokeless tobacco. He reports current alcohol use. He reports that he does not use drugs. Family History:  Family History  Problem Relation Age of Onset   Lymphoma Father    Cancer Paternal Grandmother        Colon Cancer   Colon cancer Paternal Grandmother    Colon polyps Neg Hx    Esophageal cancer Neg Hx    Rectal cancer Neg Hx    Stomach cancer Neg Hx    Diabetes Mellitus I Neg Hx    Pancreatic cancer Neg Hx      HOME MEDICATIONS: Allergies as of 11/17/2021       Reactions   Wool Alcohol [lanolin] Hives   Wool fabric        Medication List        Accurate as of November 17, 2021 12:12 PM. If you have any questions, ask your nurse or doctor.          acetaminophen 500 MG tablet Commonly known as: TYLENOL Take 1,000 mg by mouth every 6 (six) hours as needed for mild pain or moderate pain.   apixaban 5 MG Tabs tablet Commonly known as: Eliquis Take 1 tablet (5 mg total) by mouth 2 (two) times daily. PLEASE SCHEDULE APPT WITH ALYSSA FOR FURTHER REFILLS  [phone] What changed: additional instructions   aspirin EC 81 MG tablet Take 1 tablet (81 mg total) by mouth daily. What changed: when to take this   atorvastatin 40 MG tablet Commonly known as: LIPITOR TAKE 1 TABLET BY MOUTH EVERY DAY What changed: when to take this   BD Pen Needle Nano 2nd Gen 32G  X 4 MM Misc Generic drug: Insulin Pen Needle USE FOR INSULIN   Dexcom G6 Sensor Misc 1 Device by Does not apply route See admin instructions. Change every 10 days   Dexcom G6 Transmitter Misc Use as instructed to check blood sugar   Disposable Brief Large Misc 1 each by Does not apply route as needed.   furosemide 40 MG tablet Commonly known as: LASIX TAKE 1 TABLET BY MOUTH EVERY DAY What changed:  when to take this reasons to take this   lidocaine 5 % ointment Commonly known as: XYLOCAINE Apply 1 Application topically as needed.   losartan 25 MG tablet Commonly known as: COZAAR TAKE 1 TABLET BY MOUTH EVERY DAY What changed: when to take this   mirtazapine 15 MG tablet Commonly known as: Remeron Take 1 tablet (15 mg total) by mouth at bedtime.   NovoLOG FlexPen 100 UNIT/ML FlexPen Generic drug: insulin aspart Inject 4 Units into the skin 2 (two) times daily. When pump is not attached.   insulin aspart 100 UNIT/ML injection Commonly known as: NovoLOG Inject 50 Units into the skin See admin instructions. Pt loads pump with 50 units every 3 days   Omnipod 5 G6 Intro (Gen 5) Kit 1 Device by Does not apply route every 3 (three) days.   Omnipod 5 G6 Pod (Gen 5) Misc APPLY EVERY 3 (THREE) DAYS.   OneTouch Delica Plus [id number] Misc Apply 1 each topically 4 (four) times daily.   OneTouch Verio Flex System w/Device Kit   OneTouch Verio test strip Generic drug: glucose blood 1 each 4 (four) times daily.   PARoxetine 20 MG tablet Commonly known as: PAXIL TAKE 1 TABLET BY MOUTH EVERY DAY What changed: when to take this   spironolactone 25 MG  tablet Commonly known as: ALDACTONE TAKE 1 TABLET BY MOUTH EVERY DAY What changed: when to take this         OBJECTIVE:   Vital Signs: BP 136/84 (BP Location: Left Arm, Patient Position: Sitting, Cuff Size: Small)   Pulse 62   Ht _0  (1.88 m)   Wt 165 lb (74.8 kg)   SpO2 95%   BMI 21.18 kg/m   Wt Readings from Last 3 Encounters:  11/17/21 165 lb (74.8 kg)  10/23/21 177 lb 8 oz (80.5 kg)  08/18/21 174 lb 3.2 oz (79 kg)     Exam: General: Pt appears well and is in NAD  Lungs: Clear with good BS bilat with no rales, rhonchi, or wheezes  Heart: RRR   Extremities: 1+ pretibial edema.   Neuro: MS is good with appropriate affect, pt is alert and Ox3   Dm Foot Exam 11/17/2021 The skin of the feet is without sores or ulcerations but has callus formation and deformed toes The pedal pulses are undetectable  The sensation is decreased  to a screening 5.07, 10 gram monofilament bilaterally     DATA REVIEWED:  Lab Results  Component Value Date   HGBA1C 7.8 (H) 10/23/2021   HGBA1C 9.7 (A) 08/18/2021   HGBA1C 9.2 (A) 04/20/2021    Latest Reference Range & Units 10/23/21 09:36  Sodium 135 - 145 mmol/L 136  Potassium 3.5 - 5.1 mmol/L 3.5  Chloride 98 - 111 mmol/L 103  CO2 22 - 32 mmol/L 28  Glucose 70 - 99 mg/dL 269 (H)  Mean Plasma Glucose mg/dL 177.16  BUN 8 - 23 mg/dL 13  Creatinine 0.61 - 1.24 mg/dL 0.73  Calcium 8.9 - 10.3 mg/dL 8.2 (L)  Anion gap 5 - 15  5  Alkaline Phosphatase 38 - 126 U/L 91  Albumin 3.5 - 5.0 g/dL 2.8 (L)  AST 15 - 41 U/L 18  ALT 0 - 44 U/L 13  Total Protein 6.5 - 8.1 g/dL 6.1 (L)  Total Bilirubin 0.3 - 1.2 mg/dL 0.5  GFR, Estimated >60 mL/min >60    Latest Reference Range & Units 10/23/21 09:36  Glucose 70 - 99 mg/dL 269 (H)  Hemoglobin A1C 4.8 - 5.6 % 7.8 (H)      ASSESSMENT / PLAN / RECOMMENDATIONS:   1) Type 2 Diabetes Mellitus, Poorly controlled, With retinopathic , neuropathic, retinopathic and macrovascular  complications -  Most recent A1c of 7.8 %. Goal A1c < 7.0 %.     -A1c has trended down from 9.7% to 7.8% -In reviewing his pump download, he has been noted with hyperglycemia but it appears that he is having disruption of insulin through the pump . I have advised the pt that he will need to remove the pump and change location as probably the cannula is bent.   - Today I have set  up the temporary basal to start at MN on 11/30th by reducing . Step by step instruction written on the AVS     MEDICATIONS: NovoLog     Pump   Omnipod Settings   Insulin type   Novolog    Basal rate       0000 0.7              I:C ratio       0000 1:1                  Sensitivity       0000  60      Goal       0000  120            EDUCATION / INSTRUCTIONS: BG monitoring instructions: Patient is instructed to check his blood sugars before times a day, before each meal and bedtime. Call [facility] if: BG persistently < 70  I reviewed the Rule of 15 for the treatment of hypoglycemia in detail with the patient. Literature supplied.     2) Diabetic complications:  Eye: Does  have known diabetic retinopathy.  Neuro/ Feet: Does not have known diabetic peripheral neuropathy .  Renal: Patient does not have known baseline CKD. He   is  on an ACEI/ARB at present.        I spent 25 minutes preparing to see the patient by review of recent labs, imaging and procedures, obtaining and reviewing separately obtained history, communicating with the patient/family or caregiver, ordering medications, tests or procedures, and documenting clinical information in the EHR including the differential Dx, treatment, and any further evaluation and other management    Signed electronically by: Mack Guise, MD  [facility]  [facility] [address] [place], [place] [address] Phone: [phone] FAX: [phone]   CC: [name], [name], PA-C [place] [address] Phone: [phone]  Fax: [phone]  Return to Endocrinology clinic as below: Future Appointments  Date Time Provider [place]  12/01/2021  9:00 AM [name], MD [facility] None  01/09/2022 11:50 AM Murvin Gift, Melanie Crazier, MD [facility] None  01/19/2022  9:00 AM Gardiner Barefoot, DPM [facility]

## 2021-11-29 NOTE — Progress Notes (Signed)
PCP -  Cardiologist -  EKG - 07/19/21 Chest x-ray -  ECHO - 02/15/21 Cardiac Cath -  CPAP -  Fasting Blood Sugar:  Uses insulin pump - instructed to reduce basal rate by 20% at [employer] Blood Sugar:   Blood Thinner Instructions: Takes Eliquis Aspirin Instructions:   Anesthesia review: yes cardiac history, staff messaged [facility] [place]  -------------  SDW INSTRUCTIONS:  Your procedure is scheduled on 11/30/21. Please report to [facility] Main Entrance "A" at 9 A.M., and check in at the Admitting office. Call this number if you have problems the morning of surgery: [phone]   Remember: Do not eat or drink after midnight the night before your surgery     Medications to take morning of surgery with a sip of water include: Lipitor, paxil  If needed: Tylenol  Instructed to reduce basal rate on insulin pump by 20% at midnight    The Morning of Surgery   Please shower the NIGHT BEFORE/MORNING OF SURGERY (use antibacterial soap like DIAL soap if possible). Wear comfortable clothes the morning of surgery. Oral Hygiene is also important to reduce your risk of infection.  Remember - BRUSH YOUR TEETH THE MORNING OF SURGERY WITH YOUR REGULAR TOOTHPASTE

## 2021-11-30 ENCOUNTER — Ambulatory Visit (HOSPITAL_COMMUNITY): Admission: RE | Admit: 2021-11-30 | Payer: PPO | Source: Home / Self Care | Admitting: Ophthalmology

## 2021-11-30 ENCOUNTER — Telehealth: Payer: Self-pay

## 2021-11-30 SURGERY — PARS PLANA VITRECTOMY WITH 25 GAUGE
Anesthesia: General | Laterality: Left

## 2021-11-30 NOTE — Telephone Encounter (Signed)
Left vm for patient to contact Omnipod directly to get help with technical issues.

## 2021-11-30 NOTE — Progress Notes (Shared)
[facility] Note  12/01/2021     CHIEF COMPLAINT Patient presents for No chief complaint on file.  HISTORY OF PRESENT ILLNESS: [name] [name] is a 66 y.o. male who presents to the clinic today for:    Pt feels like his vision is worse today than it was last time he was here. Had surgery scheduled for OS on 10.26.23, then 11.9.23, but had to cancel x2 -- rescheduled to 11.30.23.  Referring physician: [name], [name], PA-C [address] [place],  [place] [address]  HISTORICAL INFORMATION:   Selected notes from the MEDICAL RECORD NUMBER Referred by Dr. [name] for PDR OU   CURRENT MEDICATIONS: No current outpatient medications on file. (Ophthalmic Drugs)   No current facility-administered medications for this visit. (Ophthalmic Drugs)   Current Outpatient Medications (Other)  Medication Sig   acetaminophen (TYLENOL) 500 MG tablet Take 1,000 mg by mouth every 6 (six) hours as needed for mild pain or moderate pain.   apixaban (ELIQUIS) 5 MG TABS tablet Take 1 tablet (5 mg total) by mouth 2 (two) times daily. PLEASE SCHEDULE APPT WITH ALYSSA FOR FURTHER REFILLS [phone] (Patient taking differently: Take 5 mg by mouth 2 (two) times daily.)   aspirin EC 81 MG EC tablet Take 1 tablet (81 mg total) by mouth daily. (Patient taking differently: Take 81 mg by mouth in the morning.)   atorvastatin (LIPITOR) 40 MG tablet TAKE 1 TABLET BY MOUTH EVERY DAY (Patient taking differently: Take 40 mg by mouth in the morning.)   BD PEN NEEDLE NANO 2ND GEN 32G X 4 MM MISC USE FOR INSULIN   Blood Glucose Monitoring Suppl (ONETOUCH VERIO FLEX SYSTEM) w/Device KIT    Continuous Blood Gluc Sensor (DEXCOM G6 SENSOR) MISC 1 Device by Does not apply route See admin instructions. Change every 10 days   Continuous Blood Gluc Transmit (DEXCOM G6 TRANSMITTER) MISC Use as instructed to check blood sugar   furosemide (LASIX) 40 MG tablet TAKE 1 TABLET BY MOUTH EVERY  DAY (Patient taking differently: Take 40 mg by mouth daily as needed for edema.)   Incontinence Supply Disposable (DISPOSABLE BRIEF LARGE) MISC 1 each by Does not apply route as needed.   insulin aspart (NOVOLOG FLEXPEN) 100 UNIT/ML FlexPen Inject 4 Units into the skin 2 (two) times daily. When pump is not attached.   insulin aspart (NOVOLOG) 100 UNIT/ML injection Inject 50 Units into the skin See admin instructions. Pt loads pump with 50 units every 3 days   Insulin Disposable Pump (OMNIPOD 5 G6 INTRO, GEN 5,) KIT 1 Device by Does not apply route every 3 (three) days.   Insulin Disposable Pump (OMNIPOD 5 G6 POD, GEN 5,) MISC APPLY EVERY 3 (THREE) DAYS.   Lancets (ONETOUCH DELICA PLUS [id number]) MISC Apply 1 each topically 4 (four) times daily.   lidocaine (XYLOCAINE) 5 % ointment Apply 1 Application topically as needed.   losartan (COZAAR) 25 MG tablet TAKE 1 TABLET BY MOUTH EVERY DAY (Patient taking differently: Take 25 mg by mouth in the morning.)   mirtazapine (REMERON) 15 MG tablet Take 1 tablet (15 mg total) by mouth at bedtime.   ONETOUCH VERIO test strip 1 each 4 (four) times daily.   PARoxetine (PAXIL) 20 MG tablet TAKE 1 TABLET BY MOUTH EVERY DAY (Patient taking differently: Take 20 mg by mouth in the morning.)   spironolactone (ALDACTONE) 25 MG tablet TAKE 1 TABLET BY MOUTH EVERY DAY (Patient taking differently: Take 25  mg by mouth in the morning.)   Current Facility-Administered Medications (Other)  Medication Route   diclofenac Sodium (VOLTAREN) 1 % topical gel 2 g Topical   REVIEW OF SYSTEMS:    ALLERGIES Allergies  Allergen Reactions   Wool Alcohol [Lanolin] Hives    Wool fabric    PAST MEDICAL HISTORY Past Medical History:  Diagnosis Date   Anxiety    Arthritis    Cataract    CHF (congestive heart failure) (HCC)    Constipation    pt states goes EOD- hard stools    Coronary artery disease    Depression    Diabetes mellitus without complication (HCC)     Diabetic retinopathy ([place])    DKA (diabetic ketoacidoses)    Hyperlipidemia    Hypertension    off meds   Hypertensive retinopathy    Postherpetic neuralgia 08/11/2021   Speculative diagnosis   Psoriasis    Tubular adenoma of colon 03/2019   Past Surgical History:  Procedure Laterality Date   ABDOMINAL AORTOGRAM W/LOWER EXTREMITY N/A 08/30/2020   Procedure: ABDOMINAL AORTOGRAM W/LOWER EXTREMITY;  Surgeon: Serafina Mitchell, MD;  Location: [facility];  Service: Cardiovascular;  Laterality: N/A;   ABDOMINAL AORTOGRAM W/LOWER EXTREMITY N/A 11/15/2020   Procedure: ABDOMINAL AORTOGRAM W/LOWER EXTREMITY;  Surgeon: Serafina Mitchell, MD;  Location: [facility];  Service: Cardiovascular;  Laterality: N/A;   ANKLE SURGERY     right   CARDIAC CATHETERIZATION  09/25/2001   COLONOSCOPY     ~10 yr ago- normal  per pt    CORONARY ARTERY BYPASS GRAFT  09/30/2001   CABG x 4   IR THORACENTESIS ASP PLEURAL SPACE W/IMG GUIDE  05/19/2020   LAPAROSCOPIC INGUINAL HERNIA REPAIR Bilateral 02/09/2010   MASS EXCISION Left 03/12/2013   Procedure: LEFT INDEX EXCISION MASS AND DEBRIDMENT DISTAL INTERPHALANGEAL JOINT;  Surgeon: Tennis Must, MD;  Location: [facility];  Service: Orthopedics;  Laterality: Left;   PERIPHERAL VASCULAR BALLOON ANGIOPLASTY Left 08/30/2020   Procedure: PERIPHERAL VASCULAR BALLOON ANGIOPLASTY;  Surgeon: Serafina Mitchell, MD;  Location: [facility];  Service: Cardiovascular;  Laterality: Left;  Peroneal   FAMILY HISTORY Family History  Problem Relation Age of Onset   Lymphoma Father    Cancer Paternal Grandmother        Colon Cancer   Colon cancer Paternal Grandmother    Colon polyps Neg Hx    Esophageal cancer Neg Hx    Rectal cancer Neg Hx    Stomach cancer Neg Hx    Diabetes Mellitus I Neg Hx    Pancreatic cancer Neg Hx    SOCIAL HISTORY Social History   Tobacco Use   Smoking status: Former    Types: Cigars    Quit date: 2022    Years  since quitting: 1.9   Smokeless tobacco: Never  Vaping Use   Vaping Use: Never used  Substance Use Topics   Alcohol use: Yes    Comment: about once a year   Drug use: No       OPHTHALMIC EXAM: Not recorded    IMAGING AND PROCEDURES  Imaging and Procedures for 12/01/2021          ASSESSMENT/PLAN:    ICD-10-CM   1. Proliferative diabetic retinopathy of both eyes with macular edema associated with type 2 diabetes mellitus ([place])  X10.6269     2. Vitreous hemorrhage of left eye (HCC)  H43.12     3. Essential hypertension  I10     4. Hypertensive retinopathy of both eyes  H35.033     5. Combined forms of age-related cataract of both eyes  H25.813      1. Severe proliferative diabetic retinopathy, both eyes  - last A1c 9.7 on 08.18.23; 9.2 on 4.20.23; A1c >15% on 10.11.22 - s/p PRP OS (11.10.22)  - s/p PRP OD (12.02.22)  - s/p IVA OS #1 (01.18.23) for VH, #2 (03.17.23), #3 (04.14.23), #4 (05.12.23), #5 (06.09.23), #6 (07.07.23), #7 (08.10.23), #8 (09.08.23), #9 (10.06.23), #10 (11.10.23)  - s/p IVA OD #1 (01.27.23) for macular edema/NVD, #2 (03.17.23), #3 (04.14.23), #4 (05.12.23), #5 (06.09.23), #6 (07.07.23), #7 (08.10.23), #8 (09.08.23), #9 (10.06.23), #10 (11.10.23) - pt reports improved BG control -- on insulin pump - exam shows extensive neovascularization, fibrosis and hemorrhage OU (OS >>> OD)  - OD with improved NVD / VH - OS with persistent VH - BCVA 20/60 OD; 20/300 OS -- worse OU - FA (11.09.22) shows areas of vascular non-perfusion OU, +NVD OU and +NVE OU - OCT shows OD: stable improvement in cystic changes temporal macula and fovea, persistent PRF with traction; OS: interval increase in vitreous opacities, cpersistent vitreous traction greatest along proximal arcades and disc - s/p POD1 s/p PPV/PFO/EL/FAX/14% C3F8 OS, 11.30.2023             - doing well this morning             - retina attached and in good position -- good buckle height and laser around  breaks             - IOP mildly elevated              - start   PF 4x/day OD                          zymaxid QID OD                          Atropine BID OD                          Brimonidine BID OD                          Cosopt BID OD                         PSO ung QID OD              - cont face down positioning x3 days; avoid laying flat on back              - eye shield when sleeping              - post op drop and positioning instructions reviewed              - tylenol/ibuprofen for pain              - Rx given for breakthrough pain   2. Vitreous hemorrhage OS  - acute increase in VH OS (late Dec 2022) per pt report  - s/p multiple IVA OS as above  - VH remains present and now with increasing vitreous traction and fibrosis  - s/p PPV as above  3,4. Hypertensive retinopathy OU - discussed importance of tight BP control - monitor  5. Mixed Cataract OU - The symptoms of cataract, surgical options, and treatments and risks were discussed with patient - discussed diagnosis and progression - monitor   Ophthalmic Meds Ordered this visit:  No orders of the defined types were placed in this encounter.    No follow-ups on file.  There are no Patient Instructions on file for this visit.  Explained the diagnoses, plan, and follow up with the patient and they expressed understanding.  Patient expressed understanding of the importance of proper follow up care.   This document serves as a record of services personally performed by [name], MD, PhD. It was created on their behalf by San Jetty. Owens Shark, OA an ophthalmic technician. The creation of this record is the provider's dictation and/or activities during the visit.    Electronically signed by: San Jetty. [place], [place] 11.30.2023 9:07 AM    [name], M.D., Ph.D. Diseases & Surgery of the Retina and Vitreous [facility]    Abbreviations: M myopia (nearsighted); A astigmatism; H  hyperopia (farsighted); P presbyopia; Mrx spectacle prescription;  CTL contact lenses; OD right eye; OS left eye; OU both eyes  XT exotropia; ET esotropia; PEK punctate epithelial keratitis; PEE punctate epithelial erosions; DES dry eye syndrome; MGD meibomian gland dysfunction; ATs artificial tears; PFAT's preservative free artificial tears; [place] nuclear sclerotic cataract; PSC posterior subcapsular cataract; ERM epi-retinal membrane; PVD posterior vitreous detachment; RD retinal detachment; DM diabetes mellitus; DR diabetic retinopathy; NPDR non-proliferative diabetic retinopathy; PDR proliferative diabetic retinopathy; CSME clinically significant macular edema; DME diabetic macular edema; dbh dot blot hemorrhages; CWS cotton wool spot; POAG primary open angle glaucoma; C/D cup-to-disc ratio; HVF humphrey visual field; GVF goldmann visual field; OCT optical coherence tomography; IOP intraocular pressure; BRVO Branch retinal vein occlusion; CRVO central retinal vein occlusion; CRAO central retinal artery occlusion; BRAO branch retinal artery occlusion; RT retinal tear; SB scleral buckle; PPV pars plana vitrectomy; VH Vitreous hemorrhage; PRP panretinal laser photocoagulation; IVK intravitreal kenalog; VMT vitreomacular traction; MH Macular hole;  NVD neovascularization of the disc; NVE neovascularization elsewhere; AREDS age related eye disease study; ARMD age related macular degeneration; POAG primary open angle glaucoma; EBMD epithelial/anterior basement membrane dystrophy; ACIOL anterior chamber intraocular lens; IOL intraocular lens; PCIOL posterior chamber intraocular lens; Phaco/IOL phacoemulsification with intraocular lens placement; [place] photorefractive keratectomy; LASIK laser assisted in situ keratomileusis; HTN hypertension; DM diabetes mellitus; COPD chronic obstructive pulmonary disease

## 2021-12-01 ENCOUNTER — Encounter (INDEPENDENT_AMBULATORY_CARE_PROVIDER_SITE_OTHER): Payer: HMO | Admitting: Ophthalmology

## 2021-12-07 ENCOUNTER — Encounter: Payer: Self-pay | Admitting: Nurse Practitioner

## 2021-12-14 ENCOUNTER — Encounter: Payer: Self-pay | Admitting: *Deleted

## 2021-12-21 NOTE — Progress Notes (Incomplete)
[facility] Note  12/22/2021     CHIEF COMPLAINT Patient presents for No chief complaint on file.  HISTORY OF PRESENT ILLNESS: [name] [name] is a 66 y.o. male who presents to the clinic today for:    Pt feels like his vision is worse today than it was last time he was here. Had surgery scheduled for OS on 10.26.23, then 11.9.23, but had to cancel x2 -- rescheduled to 11.30.23.  Referring physician: [name], [name], PA-C [address] [place],  [place] [address]  HISTORICAL INFORMATION:   Selected notes from the MEDICAL RECORD NUMBER Referred by Dr. [name] for PDR OU   CURRENT MEDICATIONS: No current outpatient medications on file. (Ophthalmic Drugs)   No current facility-administered medications for this visit. (Ophthalmic Drugs)   Current Outpatient Medications (Other)  Medication Sig   acetaminophen (TYLENOL) 500 MG tablet Take 1,000 mg by mouth every 6 (six) hours as needed for mild pain or moderate pain.   apixaban (ELIQUIS) 5 MG TABS tablet Take 1 tablet (5 mg total) by mouth 2 (two) times daily. PLEASE SCHEDULE APPT WITH ALYSSA FOR FURTHER REFILLS [phone] (Patient taking differently: Take 5 mg by mouth 2 (two) times daily.)   aspirin EC 81 MG EC tablet Take 1 tablet (81 mg total) by mouth daily. (Patient taking differently: Take 81 mg by mouth in the morning.)   atorvastatin (LIPITOR) 40 MG tablet TAKE 1 TABLET BY MOUTH EVERY DAY (Patient taking differently: Take 40 mg by mouth in the morning.)   BD PEN NEEDLE NANO 2ND GEN 32G X 4 MM MISC USE FOR INSULIN   Blood Glucose Monitoring Suppl (ONETOUCH VERIO FLEX SYSTEM) w/Device KIT    Continuous Blood Gluc Sensor (DEXCOM G6 SENSOR) MISC 1 Device by Does not apply route See admin instructions. Change every 10 days   Continuous Blood Gluc Transmit (DEXCOM G6 TRANSMITTER) MISC Use as instructed to check blood sugar   furosemide (LASIX) 40 MG tablet TAKE 1 TABLET BY MOUTH  EVERY DAY (Patient taking differently: Take 40 mg by mouth daily as needed for edema.)   Incontinence Supply Disposable (DISPOSABLE BRIEF LARGE) MISC 1 each by Does not apply route as needed.   insulin aspart (NOVOLOG FLEXPEN) 100 UNIT/ML FlexPen Inject 4 Units into the skin 2 (two) times daily. When pump is not attached.   insulin aspart (NOVOLOG) 100 UNIT/ML injection Inject 50 Units into the skin See admin instructions. Pt loads pump with 50 units every 3 days   Insulin Disposable Pump (OMNIPOD 5 G6 INTRO, GEN 5,) KIT 1 Device by Does not apply route every 3 (three) days.   Insulin Disposable Pump (OMNIPOD 5 G6 POD, GEN 5,) MISC APPLY EVERY 3 (THREE) DAYS.   Lancets (ONETOUCH DELICA PLUS [id number]) MISC Apply 1 each topically 4 (four) times daily.   lidocaine (XYLOCAINE) 5 % ointment Apply 1 Application topically as needed.   losartan (COZAAR) 25 MG tablet TAKE 1 TABLET BY MOUTH EVERY DAY (Patient taking differently: Take 25 mg by mouth in the morning.)   mirtazapine (REMERON) 15 MG tablet Take 1 tablet (15 mg total) by mouth at bedtime.   ONETOUCH VERIO test strip 1 each 4 (four) times daily.   PARoxetine (PAXIL) 20 MG tablet TAKE 1 TABLET BY MOUTH EVERY DAY (Patient taking differently: Take 20 mg by mouth in the morning.)   spironolactone (ALDACTONE) 25 MG tablet TAKE 1 TABLET BY MOUTH EVERY DAY (Patient taking differently: Take 25  mg by mouth in the morning.)   Current Facility-Administered Medications (Other)  Medication Route   diclofenac Sodium (VOLTAREN) 1 % topical gel 2 g Topical   REVIEW OF SYSTEMS:    ALLERGIES Allergies  Allergen Reactions   Wool Alcohol [Lanolin] Hives    Wool fabric    PAST MEDICAL HISTORY Past Medical History:  Diagnosis Date   Anxiety    Arthritis    Cataract    CHF (congestive heart failure) (HCC)    Constipation    pt states goes EOD- hard stools    Coronary artery disease    Depression    Diabetes mellitus without complication (HCC)     Diabetic retinopathy ([place])    DKA (diabetic ketoacidoses)    Hyperlipidemia    Hypertension    off meds   Hypertensive retinopathy    Postherpetic neuralgia 08/11/2021   Speculative diagnosis   Psoriasis    Tubular adenoma of colon 03/2019   Past Surgical History:  Procedure Laterality Date   ABDOMINAL AORTOGRAM W/LOWER EXTREMITY N/A 08/30/2020   Procedure: ABDOMINAL AORTOGRAM W/LOWER EXTREMITY;  Surgeon: Serafina Mitchell, MD;  Location: [facility];  Service: Cardiovascular;  Laterality: N/A;   ABDOMINAL AORTOGRAM W/LOWER EXTREMITY N/A 11/15/2020   Procedure: ABDOMINAL AORTOGRAM W/LOWER EXTREMITY;  Surgeon: Serafina Mitchell, MD;  Location: [facility];  Service: Cardiovascular;  Laterality: N/A;   ANKLE SURGERY     right   CARDIAC CATHETERIZATION  09/25/2001   COLONOSCOPY     ~10 yr ago- normal  per pt    CORONARY ARTERY BYPASS GRAFT  09/30/2001   CABG x 4   IR THORACENTESIS ASP PLEURAL SPACE W/IMG GUIDE  05/19/2020   LAPAROSCOPIC INGUINAL HERNIA REPAIR Bilateral 02/09/2010   MASS EXCISION Left 03/12/2013   Procedure: LEFT INDEX EXCISION MASS AND DEBRIDMENT DISTAL INTERPHALANGEAL JOINT;  Surgeon: Tennis Must, MD;  Location: [facility];  Service: Orthopedics;  Laterality: Left;   PERIPHERAL VASCULAR BALLOON ANGIOPLASTY Left 08/30/2020   Procedure: PERIPHERAL VASCULAR BALLOON ANGIOPLASTY;  Surgeon: Serafina Mitchell, MD;  Location: [facility];  Service: Cardiovascular;  Laterality: Left;  Peroneal   FAMILY HISTORY Family History  Problem Relation Age of Onset   Lymphoma Father    Cancer Paternal Grandmother        Colon Cancer   Colon cancer Paternal Grandmother    Colon polyps Neg Hx    Esophageal cancer Neg Hx    Rectal cancer Neg Hx    Stomach cancer Neg Hx    Diabetes Mellitus I Neg Hx    Pancreatic cancer Neg Hx    SOCIAL HISTORY Social History   Tobacco Use   Smoking status: Former    Types: Cigars    Quit date: 2022    Years  since quitting: 1.9   Smokeless tobacco: Never  Vaping Use   Vaping Use: Never used  Substance Use Topics   Alcohol use: Yes    Comment: about once a year   Drug use: No       OPHTHALMIC EXAM: Not recorded    IMAGING AND PROCEDURES  Imaging and Procedures for 12/22/2021          ASSESSMENT/PLAN:    ICD-10-CM   1. Proliferative diabetic retinopathy of both eyes with macular edema associated with type 2 diabetes mellitus ([place])  Z16.9678     2. Vitreous hemorrhage of left eye (HCC)  H43.12     3. Essential hypertension  I10     4. Hypertensive retinopathy of both eyes  H35.033     5. Combined forms of age-related cataract of both eyes  H25.813      1. Severe proliferative diabetic retinopathy, both eyes  - last A1c 9.7 on 08.18.23; 9.2 on 4.20.23; A1c >15% on 10.11.22 - s/p PRP OS (11.10.22)  - s/p PRP OD (12.02.22)  - s/p IVA OS #1 (01.18.23) for VH, #2 (03.17.23), #3 (04.14.23), #4 (05.12.23), #5 (06.09.23), #6 (07.07.23), #7 (08.10.23), #8 (09.08.23), #9 (10.06.23), #10 (11.10.23)  - s/p IVA OD #1 (01.27.23) for macular edema/NVD, #2 (03.17.23), #3 (04.14.23), #4 (05.12.23), #5 (06.09.23), #6 (07.07.23), #7 (08.10.23), #8 (09.08.23), #9 (10.06.23), #10 (11.10.23) - pt reports improved BG control -- on insulin pump - exam shows extensive neovascularization, fibrosis and hemorrhage OU (OS >>> OD)  - OD with improved NVD / VH - OS with persistent VH - BCVA 20/60 OD; 20/300 OS -- worse OU - FA (11.09.22) shows areas of vascular non-perfusion OU, +NVD OU and +NVE OU - OCT shows OD: stable improvement in cystic changes temporal macula and fovea, persistent PRF with traction; OS: interval increase in vitreous opacities, cpersistent vitreous traction greatest along proximal arcades and disc - discussed findings, severity of his disease, prognosis and likely need for extensive treatments -- PRP OU, multiple rounds of anti-VEGF therapy OU and possible surgery - recommend IVA  OU #11 today, 12.22.23 for PDR OU - pt wishes to proceed with injections - RBA of procedure discussed, questions answered - informed consent obtained and signed, 01.18.23 - see procedure note - f/u December 1, DFE/POV  2. Vitreous hemorrhage OS  - acute increase in VH OS (late Dec 2022) per pt report  - s/p multiple IVA OS as above  - VH remains present and now with increasing vitreous traction and fibrosis  - recommend PPV w/ membrane peel OS under general anesthesia - pt wishes to proceed with surgery - surgery scheduled for Thursday, November 30, 11:30 am, [facility] OR 8 - f/u Dec. 1   3,4. Hypertensive retinopathy OU - discussed importance of tight BP control - monitor  5. Mixed Cataract OU - The symptoms of cataract, surgical options, and treatments and risks were discussed with patient - discussed diagnosis and progression - monitor  Ophthalmic Meds Ordered this visit:  No orders of the defined types were placed in this encounter.    No follow-ups on file.  There are no Patient Instructions on file for this visit.  Explained the diagnoses, plan, and follow up with the patient and they expressed understanding.  Patient expressed understanding of the importance of proper follow up care.   This document serves as a record of services personally performed by [name], MD, PhD. It was created on their behalf by San Jetty. Owens Shark, OA an ophthalmic technician. The creation of this record is the provider's dictation and/or activities during the visit.    Electronically signed by: San Jetty. Owens Shark, [place] 12.21.2023 10:21 AM    [name], M.D., Ph.D. Diseases & Surgery of the Retina and Vitreous [facility]: M myopia (nearsighted); A astigmatism; H hyperopia (farsighted); P presbyopia; Mrx spectacle prescription;  CTL contact lenses; OD right eye; OS left eye; OU both eyes  XT exotropia; ET esotropia; PEK punctate epithelial  keratitis; PEE punctate epithelial erosions; DES dry eye syndrome; MGD meibomian gland dysfunction; ATs artificial tears; PFAT's preservative free artificial tears; [place] nuclear sclerotic cataract;  PSC posterior subcapsular cataract; ERM epi-retinal membrane; PVD posterior vitreous detachment; RD retinal detachment; DM diabetes mellitus; DR diabetic retinopathy; NPDR non-proliferative diabetic retinopathy; PDR proliferative diabetic retinopathy; CSME clinically significant macular edema; DME diabetic macular edema; dbh dot blot hemorrhages; CWS cotton wool spot; POAG primary open angle glaucoma; C/D cup-to-disc ratio; HVF humphrey visual field; GVF goldmann visual field; OCT optical coherence tomography; IOP intraocular pressure; BRVO Branch retinal vein occlusion; CRVO central retinal vein occlusion; CRAO central retinal artery occlusion; BRAO branch retinal artery occlusion; RT retinal tear; SB scleral buckle; PPV pars plana vitrectomy; VH Vitreous hemorrhage; PRP panretinal laser photocoagulation; IVK intravitreal kenalog; VMT vitreomacular traction; MH Macular hole;  NVD neovascularization of the disc; NVE neovascularization elsewhere; AREDS age related eye disease study; ARMD age related macular degeneration; POAG primary open angle glaucoma; EBMD epithelial/anterior basement membrane dystrophy; ACIOL anterior chamber intraocular lens; IOL intraocular lens; PCIOL posterior chamber intraocular lens; Phaco/IOL phacoemulsification with intraocular lens placement; [place] photorefractive keratectomy; LASIK laser assisted in situ keratomileusis; HTN hypertension; DM diabetes mellitus; COPD chronic obstructive pulmonary disease

## 2021-12-22 ENCOUNTER — Encounter (INDEPENDENT_AMBULATORY_CARE_PROVIDER_SITE_OTHER): Payer: HMO | Admitting: Ophthalmology

## 2021-12-22 DIAGNOSIS — E113513 Type 2 diabetes mellitus with proliferative diabetic retinopathy with macular edema, bilateral: Secondary | ICD-10-CM

## 2021-12-22 DIAGNOSIS — H35033 Hypertensive retinopathy, bilateral: Secondary | ICD-10-CM

## 2021-12-22 DIAGNOSIS — I10 Essential (primary) hypertension: Secondary | ICD-10-CM

## 2021-12-22 DIAGNOSIS — H25813 Combined forms of age-related cataract, bilateral: Secondary | ICD-10-CM

## 2021-12-22 DIAGNOSIS — H4312 Vitreous hemorrhage, left eye: Secondary | ICD-10-CM

## 2022-01-01 ENCOUNTER — Other Ambulatory Visit: Payer: Self-pay | Admitting: Internal Medicine

## 2022-01-01 DIAGNOSIS — E1042 Type 1 diabetes mellitus with diabetic polyneuropathy: Secondary | ICD-10-CM

## 2022-01-03 ENCOUNTER — Inpatient Hospital Stay (HOSPITAL_BASED_OUTPATIENT_CLINIC_OR_DEPARTMENT_OTHER)
Admission: EM | Admit: 2022-01-03 | Discharge: 2022-01-06 | DRG: 308 | Disposition: A | Discharge status: Home Health | Payer: PPO | Attending: Family Medicine | Admitting: Family Medicine

## 2022-01-03 ENCOUNTER — Other Ambulatory Visit: Payer: Self-pay

## 2022-01-03 ENCOUNTER — Encounter (HOSPITAL_COMMUNITY): Payer: Self-pay

## 2022-01-03 ENCOUNTER — Observation Stay (HOSPITAL_COMMUNITY): Payer: PPO

## 2022-01-03 ENCOUNTER — Emergency Department (HOSPITAL_BASED_OUTPATIENT_CLINIC_OR_DEPARTMENT_OTHER): Payer: PPO

## 2022-01-03 ENCOUNTER — Encounter (HOSPITAL_BASED_OUTPATIENT_CLINIC_OR_DEPARTMENT_OTHER): Payer: Self-pay

## 2022-01-03 DIAGNOSIS — I2510 Atherosclerotic heart disease of native coronary artery without angina pectoris: Secondary | ICD-10-CM | POA: Diagnosis present

## 2022-01-03 DIAGNOSIS — R441 Visual hallucinations: Secondary | ICD-10-CM | POA: Diagnosis present

## 2022-01-03 DIAGNOSIS — R296 Repeated falls: Secondary | ICD-10-CM | POA: Diagnosis present

## 2022-01-03 DIAGNOSIS — R4182 Altered mental status, unspecified: Secondary | ICD-10-CM | POA: Diagnosis not present

## 2022-01-03 DIAGNOSIS — D649 Anemia, unspecified: Secondary | ICD-10-CM | POA: Diagnosis present

## 2022-01-03 DIAGNOSIS — F29 Unspecified psychosis not due to a substance or known physiological condition: Secondary | ICD-10-CM | POA: Diagnosis not present

## 2022-01-03 DIAGNOSIS — I480 Paroxysmal atrial fibrillation: Secondary | ICD-10-CM | POA: Diagnosis present

## 2022-01-03 DIAGNOSIS — Z807 Family history of other malignant neoplasms of lymphoid, hematopoietic and related tissues: Secondary | ICD-10-CM

## 2022-01-03 DIAGNOSIS — Z7901 Long term (current) use of anticoagulants: Secondary | ICD-10-CM

## 2022-01-03 DIAGNOSIS — Z7982 Long term (current) use of aspirin: Secondary | ICD-10-CM

## 2022-01-03 DIAGNOSIS — E876 Hypokalemia: Secondary | ICD-10-CM | POA: Diagnosis present

## 2022-01-03 DIAGNOSIS — Z9641 Presence of insulin pump (external) (internal): Secondary | ICD-10-CM | POA: Diagnosis present

## 2022-01-03 DIAGNOSIS — Z794 Long term (current) use of insulin: Secondary | ICD-10-CM | POA: Diagnosis not present

## 2022-01-03 DIAGNOSIS — I4819 Other persistent atrial fibrillation: Principal | ICD-10-CM | POA: Diagnosis present

## 2022-01-03 DIAGNOSIS — I110 Hypertensive heart disease with heart failure: Secondary | ICD-10-CM | POA: Diagnosis present

## 2022-01-03 DIAGNOSIS — E785 Hyperlipidemia, unspecified: Secondary | ICD-10-CM | POA: Diagnosis present

## 2022-01-03 DIAGNOSIS — I614 Nontraumatic intracerebral hemorrhage in cerebellum: Secondary | ICD-10-CM | POA: Diagnosis not present

## 2022-01-03 DIAGNOSIS — N390 Urinary tract infection, site not specified: Secondary | ICD-10-CM | POA: Diagnosis present

## 2022-01-03 DIAGNOSIS — R338 Other retention of urine: Secondary | ICD-10-CM | POA: Diagnosis not present

## 2022-01-03 DIAGNOSIS — E1165 Type 2 diabetes mellitus with hyperglycemia: Secondary | ICD-10-CM | POA: Diagnosis present

## 2022-01-03 DIAGNOSIS — Z800 Family history of malignant neoplasm of digestive organs: Secondary | ICD-10-CM

## 2022-01-03 DIAGNOSIS — R319 Hematuria, unspecified: Secondary | ICD-10-CM | POA: Diagnosis present

## 2022-01-03 DIAGNOSIS — N3289 Other specified disorders of bladder: Secondary | ICD-10-CM | POA: Diagnosis not present

## 2022-01-03 DIAGNOSIS — I25810 Atherosclerosis of coronary artery bypass graft(s) without angina pectoris: Secondary | ICD-10-CM | POA: Diagnosis not present

## 2022-01-03 DIAGNOSIS — R55 Syncope and collapse: Secondary | ICD-10-CM | POA: Diagnosis not present

## 2022-01-03 DIAGNOSIS — I10 Essential (primary) hypertension: Secondary | ICD-10-CM | POA: Diagnosis present

## 2022-01-03 DIAGNOSIS — R9431 Abnormal electrocardiogram [ECG] [EKG]: Secondary | ICD-10-CM | POA: Diagnosis not present

## 2022-01-03 DIAGNOSIS — R443 Hallucinations, unspecified: Secondary | ICD-10-CM

## 2022-01-03 DIAGNOSIS — I5033 Acute on chronic diastolic (congestive) heart failure: Secondary | ICD-10-CM | POA: Diagnosis present

## 2022-01-03 DIAGNOSIS — L89152 Pressure ulcer of sacral region, stage 2: Secondary | ICD-10-CM | POA: Diagnosis present

## 2022-01-03 DIAGNOSIS — Z951 Presence of aortocoronary bypass graft: Secondary | ICD-10-CM | POA: Diagnosis not present

## 2022-01-03 DIAGNOSIS — E43 Unspecified severe protein-calorie malnutrition: Secondary | ICD-10-CM | POA: Insufficient documentation

## 2022-01-03 DIAGNOSIS — J90 Pleural effusion, not elsewhere classified: Secondary | ICD-10-CM | POA: Diagnosis not present

## 2022-01-03 DIAGNOSIS — Z6821 Body mass index (BMI) 21.0-21.9, adult: Secondary | ICD-10-CM | POA: Diagnosis not present

## 2022-01-03 DIAGNOSIS — R001 Bradycardia, unspecified: Secondary | ICD-10-CM | POA: Diagnosis not present

## 2022-01-03 DIAGNOSIS — R42 Dizziness and giddiness: Secondary | ICD-10-CM | POA: Diagnosis not present

## 2022-01-03 DIAGNOSIS — Z79899 Other long term (current) drug therapy: Secondary | ICD-10-CM | POA: Diagnosis not present

## 2022-01-03 DIAGNOSIS — Z91048 Other nonmedicinal substance allergy status: Secondary | ICD-10-CM | POA: Diagnosis not present

## 2022-01-03 DIAGNOSIS — I4891 Unspecified atrial fibrillation: Secondary | ICD-10-CM | POA: Diagnosis present

## 2022-01-03 DIAGNOSIS — I739 Peripheral vascular disease, unspecified: Secondary | ICD-10-CM | POA: Diagnosis not present

## 2022-01-03 DIAGNOSIS — I5032 Chronic diastolic (congestive) heart failure: Secondary | ICD-10-CM | POA: Diagnosis not present

## 2022-01-03 LAB — AMMONIA: Ammonia: 34 umol/L (ref 9–35)

## 2022-01-03 LAB — COMPREHENSIVE METABOLIC PANEL
ALT: 7 U/L (ref 0–44)
AST: 16 U/L (ref 15–41)
Albumin: 3.3 g/dL — ABNORMAL LOW (ref 3.5–5.0)
Alkaline Phosphatase: 93 U/L (ref 38–126)
Anion gap: 7 (ref 5–15)
BUN: 14 mg/dL (ref 8–23)
CO2: 32 mmol/L (ref 22–32)
Calcium: 8.8 mg/dL — ABNORMAL LOW (ref 8.9–10.3)
Chloride: 95 mmol/L — ABNORMAL LOW (ref 98–111)
Creatinine, Ser: 0.76 mg/dL (ref 0.61–1.24)
GFR, Estimated: >60 mL/min (ref >60)
Glucose, Bld: 302 mg/dL — ABNORMAL HIGH (ref 70–99)
Potassium: 3.5 mmol/L (ref 3.5–5.1)
Sodium: 134 mmol/L — ABNORMAL LOW (ref 135–145)
Total Bilirubin: 0.7 mg/dL (ref 0.3–1.2)
Total Protein: 6.6 g/dL (ref 6.5–8.1)

## 2022-01-03 LAB — URINALYSIS, ROUTINE W REFLEX MICROSCOPIC
Bilirubin Urine: NEGATIVE
Glucose, UA: >=500 mg/dL — AB
Ketones, ur: NEGATIVE mg/dL
Nitrite: NEGATIVE
Protein, ur: >300 mg/dL — AB
Specific Gravity, Urine: 1.015 (ref 1.005–1.030)
pH: 6 (ref 5.0–8.0)

## 2022-01-03 LAB — URINALYSIS, MICROSCOPIC (REFLEX): WBC, UA: >50 WBC/hpf (ref 0–5)

## 2022-01-03 LAB — FOLATE: Folate: 19.4 ng/mL (ref >5.9)

## 2022-01-03 LAB — CBC
HCT: 38.7 % — ABNORMAL LOW (ref 39.0–52.0)
Hemoglobin: 12.6 g/dL — ABNORMAL LOW (ref 13.0–17.0)
MCH: 28.8 pg (ref 26.0–34.0)
MCHC: 32.6 g/dL (ref 30.0–36.0)
MCV: 88.6 fL (ref 80.0–100.0)
Platelets: 176 10*3/uL (ref 150–400)
RBC: 4.37 MIL/uL (ref 4.22–5.81)
RDW: 15.7 % — ABNORMAL HIGH (ref 11.5–15.5)
WBC: 5 10*3/uL (ref 4.0–10.5)
nRBC: 0 % (ref 0.0–0.2)

## 2022-01-03 LAB — ETHANOL: Alcohol, Ethyl (B): <10 mg/dL (ref <10)

## 2022-01-03 LAB — I-STAT VENOUS BLOOD GAS, ED
Acid-Base Excess: 5 mmol/L — ABNORMAL HIGH (ref 0.0–2.0)
Bicarbonate: 31.3 mmol/L — ABNORMAL HIGH (ref 20.0–28.0)
Calcium, Ion: 1.21 mmol/L (ref 1.15–1.40)
HCT: 38 % — ABNORMAL LOW (ref 39.0–52.0)
Hemoglobin: 12.9 g/dL — ABNORMAL LOW (ref 13.0–17.0)
O2 Saturation: 69 %
Potassium: 3.5 mmol/L (ref 3.5–5.1)
Sodium: 137 mmol/L (ref 135–145)
TCO2: 33 mmol/L — ABNORMAL HIGH (ref 22–32)
pCO2, Ven: 52.1 mmHg (ref 44–60)
pH, Ven: 7.387 (ref 7.25–7.43)
pO2, Ven: 37 mmHg (ref 32–45)

## 2022-01-03 LAB — GLUCOSE, CAPILLARY: Glucose-Capillary: 121 mg/dL — ABNORMAL HIGH (ref 70–99)

## 2022-01-03 LAB — TSH: TSH: 4.051 u[IU]/mL (ref 0.350–4.500)

## 2022-01-03 LAB — PHOSPHORUS: Phosphorus: 3.6 mg/dL (ref 2.5–4.6)

## 2022-01-03 LAB — VITAMIN B12: Vitamin B-12: 550 pg/mL (ref 180–914)

## 2022-01-03 LAB — MAGNESIUM: Magnesium: 1.7 mg/dL (ref 1.7–2.4)

## 2022-01-03 LAB — TROPONIN I (HIGH SENSITIVITY)
Troponin I (High Sensitivity): 20 ng/L — ABNORMAL HIGH (ref <18)
Troponin I (High Sensitivity): 18 ng/L — ABNORMAL HIGH (ref <18)

## 2022-01-03 LAB — SALICYLATE LEVEL: Salicylate Lvl: <7 mg/dL — ABNORMAL LOW (ref 7.0–30.0)

## 2022-01-03 LAB — ACETAMINOPHEN LEVEL: Acetaminophen (Tylenol), Serum: <10 ug/mL — ABNORMAL LOW (ref 10–30)

## 2022-01-03 LAB — CBG MONITORING, ED: Glucose-Capillary: 307 mg/dL — ABNORMAL HIGH (ref 70–99)

## 2022-01-03 MED ORDER — MIRTAZAPINE 15 MG PO TABS
15.0000 mg | ORAL_TABLET | Freq: Every day | ORAL | Status: DC
  Administered 2022-01-04 – 2022-01-05 (×3): 15 mg via ORAL
  Filled 2022-01-03 (×3): qty 1

## 2022-01-03 MED ORDER — INSULIN ASPART 100 UNIT/ML IJ SOLN
0.0000 [IU] | INTRAMUSCULAR | Status: DC
  Administered 2022-01-04 (×4): 1 [IU] via SUBCUTANEOUS
  Administered 2022-01-05: 2 [IU] via SUBCUTANEOUS
  Administered 2022-01-05: 1 [IU] via SUBCUTANEOUS
  Administered 2022-01-05: 2 [IU] via SUBCUTANEOUS

## 2022-01-03 MED ORDER — SODIUM CHLORIDE 0.9% FLUSH: 3.0000 mL | INTRAVENOUS | Status: DC | PRN

## 2022-01-03 MED ORDER — ACETAMINOPHEN 650 MG RE SUPP: 650.0000 mg | Freq: Four times a day (QID) | RECTAL | Status: DC | PRN

## 2022-01-03 MED ORDER — SODIUM CHLORIDE 0.9% FLUSH
3.0000 mL | Freq: Two times a day (BID) | INTRAVENOUS | Status: DC
  Administered 2022-01-04 – 2022-01-05 (×4): 3 mL via INTRAVENOUS

## 2022-01-03 MED ORDER — APIXABAN 5 MG PO TABS
5.0000 mg | ORAL_TABLET | Freq: Two times a day (BID) | ORAL | Status: DC
  Administered 2022-01-04 – 2022-01-06 (×6): 5 mg via ORAL
  Filled 2022-01-03 (×6): qty 1

## 2022-01-03 MED ORDER — MAGNESIUM SULFATE 50 % IJ SOLN: 1.0000 g | Freq: Once | INTRAMUSCULAR | Status: DC

## 2022-01-03 MED ORDER — MAGNESIUM SULFATE IN D5W 1-5 GM/100ML-% IV SOLN
1.0000 g | Freq: Once | INTRAVENOUS | Status: DC
  Filled 2022-01-03: qty 100

## 2022-01-03 MED ORDER — HYDROCODONE-ACETAMINOPHEN 5-325 MG PO TABS
1.0000 | ORAL_TABLET | ORAL | Status: DC | PRN
  Administered 2022-01-05: 1 via ORAL
  Filled 2022-01-03: qty 1

## 2022-01-03 MED ORDER — ASPIRIN 81 MG PO TBEC
81.0000 mg | DELAYED_RELEASE_TABLET | Freq: Every day | ORAL | Status: DC
  Administered 2022-01-04 – 2022-01-06 (×3): 81 mg via ORAL
  Filled 2022-01-03 (×3): qty 1

## 2022-01-03 MED ORDER — PAROXETINE HCL 20 MG PO TABS
20.0000 mg | ORAL_TABLET | Freq: Every day | ORAL | Status: DC
  Administered 2022-01-04 – 2022-01-06 (×3): 20 mg via ORAL
  Filled 2022-01-03 (×3): qty 1

## 2022-01-03 MED ORDER — SPIRONOLACTONE 25 MG PO TABS
25.0000 mg | ORAL_TABLET | Freq: Every day | ORAL | Status: DC
  Administered 2022-01-04: 25 mg via ORAL
  Filled 2022-01-03: qty 1

## 2022-01-03 MED ORDER — ATORVASTATIN CALCIUM 40 MG PO TABS
40.0000 mg | ORAL_TABLET | Freq: Every day | ORAL | Status: DC
  Administered 2022-01-04 – 2022-01-06 (×3): 40 mg via ORAL
  Filled 2022-01-03 (×3): qty 1

## 2022-01-03 MED ORDER — SODIUM CHLORIDE 0.9 % IV SOLN: 250.0000 mL | INTRAVENOUS | Status: DC | PRN

## 2022-01-03 MED ORDER — ACETAMINOPHEN 325 MG PO TABS: 650.0000 mg | ORAL_TABLET | Freq: Four times a day (QID) | ORAL | Status: DC | PRN

## 2022-01-03 MED ORDER — LOSARTAN POTASSIUM 25 MG PO TABS
25.0000 mg | ORAL_TABLET | Freq: Every day | ORAL | Status: DC
  Administered 2022-01-04 – 2022-01-05 (×2): 25 mg via ORAL
  Filled 2022-01-03 (×2): qty 1

## 2022-01-03 NOTE — Assessment & Plan Note (Signed)
Resume Cozaar25 mg a day and spironolactone 25 mg a day

## 2022-01-03 NOTE — ED Notes (Signed)
Pts HR dipping into 30s, pt placed on cardiac monitor and RN notified.

## 2022-01-03 NOTE — ED Notes (Signed)
Patient ambulated independently to the restroom with walker. Steady gait. No issues observed.

## 2022-01-03 NOTE — Assessment & Plan Note (Addendum)
TSH WNL pt is asymptomatic not on BB  Monitor for tonight CArdiology consult in AM  Order echo

## 2022-01-03 NOTE — ED Notes (Signed)
Patient transported to CT 

## 2022-01-03 NOTE — H&P (Signed)
[name] [name] IDP:[id number] DOB: 01-12-1955 DOA: 01/03/2022 PCP: Fredirick Lathe, PA-C   Outpatient Specialists:   Cardiology [facility]  Endo  Shamleffer, Melanie Crazier, MD   Patient arrived to ER on 01/03/22 at 1151 Referred by Attending No att. providers found   Patient coming from:    home Lives  With family    Chief Complaint:   Chief Complaint  Patient presents with   Hallucinations    HPI: [name] [name] is a 67 y.o. male with medical history significant of A-fib on Eliquis, CAD status post CABG, PAD, diastolic CHF, type 2 diabetes on insulin pump hypertension hyperlipidemia,    Presented with   new hallucinations Has been progressively needing more assistance over the past 1 year but recently has been having more hallucinations which is not typical for the patient seen large bugs and lizards patient is aware this denies alcohol but does smoke sick cars on occasion Illicit issues do not seem to be distressing for the patient While in the emergency department noted that his A-fib with slow ventricular response down to 30s rare bacteria in his urine Does not smoke or drink He had a fall down stairs since 2022 with multiple fractures Family has been living with him Pt believes he actually seen the Lizards and spiders Thinks it was a pet of his neighbor  Reports he has added lasix to his meds at night  He has been urinating more frequently, with some retention  Regarding pertinent Chronic problems:     Hyperlipidemia - on statins Lipitor (atorvastatin)  Lipid Panel     Component Value Date/Time   CHOL 78 02/16/2021 0434   TRIG 26 02/16/2021 0434   HDL 39 (L) 02/16/2021 0434   CHOLHDL 2.0 02/16/2021 0434   VLDL 5 02/16/2021 0434   LDLCALC 34 02/16/2021 0434   LDLDIRECT 37.0 03/16/2020 0958     HTN on cozaar   chronic CHF diastolic  - last echo Feb 4431 Grade I diastolic dysfunction  On lasix, spironolactone   CAD  - On  statin,                   - followed by cardiology                - last cardiac cath       DM 2 -  Lab Results  Component Value Date   HGBA1C 7.8 (H) 10/23/2021   on insulin, pump       Chronic anemia - baseline hg Hemoglobin & Hematocrit  Recent Labs    10/23/21 0936 01/03/22 1235 01/03/22 1910  HGB 11.4* 12.6* 12.9*     While in ER:    TSH within normal limits troponin 18   CT HEAD   NON acute  CXR - ordered    Following Medications were ordered in ER: Medications - No data to display      ED Triage Vitals  Enc Vitals Group     BP 01/03/22 1224 (!) 162/102     Pulse Rate 01/03/22 1224 80     Resp 01/03/22 1224 18     Temp 01/03/22 1224 97.9 F (36.6 C)     Temp Source 01/03/22 1224 Oral     SpO2 01/03/22 1224 96 %     Weight 01/03/22 1221 164 lb 14.5 oz (74.8 kg)     Height 01/03/22 1221 _0  (1.88 m)     Head Circumference --  Peak Flow --      Pain Score 01/03/22 2105 0     Pain Loc --      Pain Edu? --      Excl. in [place]? --   TMAX(24)@     _________________________________________ Significant initial  Findings: Abnormal Labs Reviewed  COMPREHENSIVE METABOLIC PANEL - Abnormal; Notable for the following components:      Result Value   Sodium 134 (*)    Chloride 95 (*)    Glucose, Bld 302 (*)    Calcium 8.8 (*)    Albumin 3.3 (*)    All other components within normal limits  CBC - Abnormal; Notable for the following components:   Hemoglobin 12.6 (*)    HCT 38.7 (*)    RDW 15.7 (*)    All other components within normal limits  URINALYSIS, ROUTINE W REFLEX MICROSCOPIC - Abnormal; Notable for the following components:   APPearance HAZY (*)    Glucose, UA >=500 (*)    Hgb urine dipstick LARGE (*)    Protein, ur >300 (*)    Leukocytes,Ua MODERATE (*)    All other components within normal limits  ACETAMINOPHEN LEVEL - Abnormal; Notable for the following components:   Acetaminophen (Tylenol), Serum <10 (*)    All other components within normal limits   SALICYLATE LEVEL - Abnormal; Notable for the following components:   Salicylate Lvl <8.0 (*)    All other components within normal limits  URINALYSIS, MICROSCOPIC (REFLEX) - Abnormal; Notable for the following components:   Bacteria, UA RARE (*)    All other components within normal limits  GLUCOSE, CAPILLARY - Abnormal; Notable for the following components:   Glucose-Capillary 121 (*)    All other components within normal limits  CBG MONITORING, ED - Abnormal; Notable for the following components:   Glucose-Capillary 307 (*)    All other components within normal limits  I-STAT VENOUS BLOOD GAS, ED - Abnormal; Notable for the following components:   Bicarbonate 31.3 (*)    TCO2 33 (*)    Acid-Base Excess 5.0 (*)    HCT 38.0 (*)    Hemoglobin 12.9 (*)    All other components within normal limits  TROPONIN I (HIGH SENSITIVITY) - Abnormal; Notable for the following components:   Troponin I (High Sensitivity) 20 (*)    All other components within normal limits  TROPONIN I (HIGH SENSITIVITY) - Abnormal; Notable for the following components:   Troponin I (High Sensitivity) 18 (*)    All other components within normal limits    _________________________ Troponin 18 -20 ECG: Ordered Personally reviewed and interpreted by me showing: HR : 46 Rhythm:  A.fib. W SVR,     no evidence of ischemic changes QTC 483    The recent clinical data is shown below. Vitals:   01/03/22 2030 01/03/22 2037 01/03/22 2045 01/03/22 2200  BP:    (!) 174/89  Pulse: (!) 43  (!) 50 (!) 50  Resp: _0 Temp:  98.1 F (36.7 C)  98.1 F (36.7 C)  TempSrc:  Oral  Oral  SpO2: 98%  99% 98%  Weight:    78.1 kg  Height:    _1  (1.88 m)     WBC     Component Value Date/Time   WBC 5.0 01/03/2022 1235   LYMPHSABS 0.9 07/17/2021 2153   MONOABS 0.8 07/17/2021 2153   EOSABS 0.0 07/17/2021 2153   BASOSABS 0.0 07/17/2021 2153  UA abnormal   Urine analysis:    Component Value Date/Time    COLORURINE YELLOW 01/03/2022 1235   APPEARANCEUR HAZY (A) 01/03/2022 1235   LABSPEC 1.015 01/03/2022 1235   PHURINE 6.0 01/03/2022 1235   GLUCOSEU >=500 (A) 01/03/2022 1235   GLUCOSEU >=1000 (A) 03/16/2020 0958   HGBUR LARGE (A) 01/03/2022 1235   BILIRUBINUR NEGATIVE 01/03/2022 1235   KETONESUR NEGATIVE 01/03/2022 1235   PROTEINUR >300 (A) 01/03/2022 1235   UROBILINOGEN 0.2 03/16/2020 0958   NITRITE NEGATIVE 01/03/2022 1235   LEUKOCYTESUR MODERATE (A) 01/03/2022 1235    _______________________________________________ Hospitalist was called for admission for   Bradycardia,    Hallucinations    The following Work up has been ordered so far:  Orders Placed This Encounter  Procedures   CT Head Wo Contrast   Comprehensive metabolic panel   CBC   Urinalysis, Routine w reflex microscopic   Acetaminophen level   Salicylate level   Ethanol   Urinalysis, Microscopic (reflex)   Ammonia   TSH   Magnesium   Phosphorus   Vitamin B12   Folate   RPR   Glucose, capillary   Diet NPO time specified   Document Height and Actual Weight   Cardiac Monitoring - Continuous Indefinite   Consult to hospitalist   Inpatient consult to Cardiology   CBG monitoring, ED   CBG monitoring, ED   I-Stat venous blood gas, ED   ED EKG   Place in observation (patient's expected length of stay will be less than 2 midnights)     OTHER Significant initial  Findings:  labs showing:  Recent Labs  Lab 01/03/22 1235 01/03/22 1910 01/03/22 2006  NA 134* 137  --   K 3.5 3.5  --   CO2 32  --   --   GLUCOSE 302*  --   --   BUN 14  --   --   CREATININE 0.76  --   --   CALCIUM 8.8*  --   --   MG  --   --  1.7  PHOS  --   --  3.6    Cr  stable,    Lab Results  Component Value Date   CREATININE 0.76 01/03/2022   CREATININE 0.73 10/23/2021   CREATININE 1.16 07/17/2021    Recent Labs  Lab 01/03/22 1235  AST 16  ALT 7  ALKPHOS 93  BILITOT 0.7  PROT 6.6  ALBUMIN 3.3*   Lab Results   Component Value Date   CALCIUM 8.8 (L) 01/03/2022   PHOS 3.6 01/03/2022    Plt: Lab Results  Component Value Date   PLT 176 01/03/2022    COVID-19 Labs  No results for input(s): "DDIMER", "FERRITIN", "LDH", "CRP" in the last 72 hours.  Lab Results  Component Value Date   SARSCOV2NAA POSITIVE (A) 07/17/2021   SARSCOV2NAA NEGATIVE 02/14/2021   [place] NEGATIVE 05/24/2020   [place] NEGATIVE 05/16/2020      Recent Labs  Lab 01/03/22 1235 01/03/22 1910  WBC 5.0  --   HGB 12.6* 12.9*  HCT 38.7* 38.0*  MCV 88.6  --   PLT 176  --   HG/HCT   stable,     Component Value Date/Time   HGB 12.9 (L) 01/03/2022 1910   HCT 38.0 (L) 01/03/2022 1910   MCV 88.6 01/03/2022 1235     No results for input(s): "LIPASE", "AMYLASE" in the last 168 hours. Recent Labs  Lab 01/03/22 1907  AMMONIA 34  VBG   7.387 Potassium 3.5 mmol/L  pCO2, Ven 52.1 mmHg     Cardiac Panel (last 3 results) No results for input(s): "CKTOTAL", "CKMB", "TROPONINI", "RELINDX" in the last 72 hours.  .car BNP (last 3 results) Recent Labs    02/14/21 0926 07/17/21 2153  BNP 187.4* 146.2*    DM  labs:  HbA1C: Recent Labs    04/20/21 0824 08/18/21 1208 10/23/21 0936  HGBA1C 9.2* 9.7* 7.8*    CBG (last 3)  Recent Labs    01/03/22 1228 01/03/22 2209  GLUCAP 307* 121*      Cultures:    Component Value Date/Time   SDES  10/13/2020 1030    HEEL LEFT Performed at [facility], [place] [address] [place], [place] [address]    [place]  10/13/2020 1030    NONE Performed at [facility], [place] [address], [place], [place] [address]    CULT  10/13/2020 1030    ABUNDANT PROTEUS MIRABILIS ABUNDANT CITROBACTER FREUNDII WITHIN MIXED ORGANISMS NO ANAEROBES ISOLATED Performed at [facility], [place] [address], [place], [place] [address]    REPTSTATUS 10/18/2020 FINAL 10/13/2020 1030     Radiological Exams on Admission: CT Head Wo  Contrast  Result Date: 01/03/2022 CLINICAL DATA:  Hallucinations EXAM: CT HEAD WITHOUT CONTRAST TECHNIQUE: Contiguous axial images were obtained from the base of the skull through the vertex without intravenous contrast. RADIATION DOSE REDUCTION: This exam was performed according to the departmental dose-optimization program which includes automated exposure control, adjustment of the mA and/or kV according to patient size and/or use of iterative reconstruction technique. COMPARISON:  05/16/2020 FINDINGS: Brain: No acute intracranial findings are seen. There are no signs of bleeding within the cranium. Cortical sulci are prominent. There is decreased density in periventricular white matter. Vascular: Scattered arterial calcifications are seen. Skull: Unremarkable. Sinuses/Orbits: Unremarkable. Other: None. IMPRESSION: No acute intracranial findings are seen in noncontrast CT brain. Atrophy. Small-vessel disease. Electronically Signed   By: Elmer Picker M.D.   On: 01/03/2022 12:54   _______________________________________________________________________________________________________ Latest  Blood pressure (!) 174/89, pulse (!) 50, temperature 98.1 F (36.7 C), temperature source Oral, resp. rate 18, height _0  (1.88 m), weight 78.1 kg, SpO2 98 %.   Vitals  labs and radiology finding personally reviewed  Review of Systems:    Pertinent positives include:  fatigue, hallucinations  Constitutional:  No weight loss, night sweats, Fevers, chills,  weight loss  HEENT:  No headaches, Difficulty swallowing,Tooth/dental problems,Sore throat,  No sneezing, itching, ear ache, nasal congestion, post nasal drip,  Cardio-vascular:  No chest pain, Orthopnea, PND, anasarca, dizziness, palpitations.no Bilateral lower extremity swelling  GI:  No heartburn, indigestion, abdominal pain, nausea, vomiting, diarrhea, change in bowel habits, loss of appetite, melena, blood in stool, hematemesis Resp:  no  shortness of breath at rest. No dyspnea on exertion, No excess mucus, no productive cough, No non-productive cough, No coughing up of blood.No change in color of mucus.No wheezing. Skin:  no rash or lesions. No jaundice GU:  no dysuria, change in color of urine, no urgency or frequency. No straining to urinate.  No flank pain.  Musculoskeletal:  No joint pain or no joint swelling. No decreased range of motion. No back pain.  Psych:  No change in mood or affect. No depression or anxiety. No memory loss.  Neuro: no localizing neurological complaints, no tingling, no weakness, no double vision, no gait abnormality, no slurred speech, no confusion  All systems reviewed and  apart from [place] all are negative _______________________________________________________________________________________________ Past Medical History:   Past Medical History:  Diagnosis Date   Anxiety    Arthritis    Cataract    CHF (congestive heart failure) ([place])    Constipation    pt states goes EOD- hard stools    Coronary artery disease    Depression    Diabetes mellitus without complication (HCC)    Diabetic retinopathy ([place])    DKA (diabetic ketoacidoses)    Hyperlipidemia    Hypertension    off meds   Hypertensive retinopathy    Postherpetic neuralgia 08/11/2021   Speculative diagnosis   Psoriasis    Tubular adenoma of colon 03/2019     Past Surgical History:  Procedure Laterality Date   ABDOMINAL AORTOGRAM W/LOWER EXTREMITY N/A 08/30/2020   Procedure: ABDOMINAL AORTOGRAM W/LOWER EXTREMITY;  Surgeon: Serafina Mitchell, MD;  Location: [facility];  Service: Cardiovascular;  Laterality: N/A;   ABDOMINAL AORTOGRAM W/LOWER EXTREMITY N/A 11/15/2020   Procedure: ABDOMINAL AORTOGRAM W/LOWER EXTREMITY;  Surgeon: Serafina Mitchell, MD;  Location: [facility];  Service: Cardiovascular;  Laterality: N/A;   ANKLE SURGERY     right   CARDIAC CATHETERIZATION  09/25/2001   COLONOSCOPY     ~10 yr ago-  normal  per pt    CORONARY ARTERY BYPASS GRAFT  09/30/2001   CABG x 4   IR THORACENTESIS ASP PLEURAL SPACE W/IMG GUIDE  05/19/2020   LAPAROSCOPIC INGUINAL HERNIA REPAIR Bilateral 02/09/2010   MASS EXCISION Left 03/12/2013   Procedure: LEFT INDEX EXCISION MASS AND DEBRIDMENT DISTAL INTERPHALANGEAL JOINT;  Surgeon: Tennis Must, MD;  Location: [facility];  Service: Orthopedics;  Laterality: Left;   PERIPHERAL VASCULAR BALLOON ANGIOPLASTY Left 08/30/2020   Procedure: PERIPHERAL VASCULAR BALLOON ANGIOPLASTY;  Surgeon: Serafina Mitchell, MD;  Location: [facility];  Service: Cardiovascular;  Laterality: Left;  Peroneal    Social History:  Ambulatory   independently      reports that he quit smoking about 2 years ago. His smoking use included cigars. He has never used smokeless tobacco. He reports current alcohol use. He reports that he does not use drugs.   Family History:  Family History  Problem Relation Age of Onset   Lymphoma Father    Cancer Paternal Grandmother        Colon Cancer   Colon cancer Paternal Grandmother    Colon polyps Neg Hx    Esophageal cancer Neg Hx    Rectal cancer Neg Hx    Stomach cancer Neg Hx    Diabetes Mellitus I Neg Hx    Pancreatic cancer Neg Hx    ____________________________________________________________________________ Allergies: Allergies  Allergen Reactions   Wool Alcohol [Lanolin] Hives    Wool fabric    Prior to Admission medications   Medication Sig Start Date End Date Taking? Authorizing Provider  acetaminophen (TYLENOL) 500 MG tablet Take 1,000 mg by mouth every 6 (six) hours as needed for mild pain or moderate pain.    [provider]  apixaban (ELIQUIS) 5 MG TABS tablet Take 1 tablet (5 mg total) by mouth 2 (two) times daily. PLEASE SCHEDULE APPT WITH ALYSSA FOR FURTHER REFILLS [phone] Patient taking differently: Take 5 mg by mouth 2 (two) times daily. 07/05/21   [name], [name], PA-C  aspirin EC 81 MG  EC tablet Take 1 tablet (81 mg total) by mouth daily. Patient taking differently: Take 81 mg by mouth in the morning.  02/18/21   Domenic Polite, MD  atorvastatin (LIPITOR) 40 MG tablet TAKE 1 TABLET BY MOUTH EVERY DAY Patient taking differently: Take 40 mg by mouth in the morning. 09/26/21   [name], [name], PA-C  BD PEN NEEDLE NANO 2ND GEN 32G X 4 MM MISC USE FOR INSULIN 10/05/20   Janith Lima, MD  Blood Glucose Monitoring Suppl ([place]) w/Device KIT  10/07/20   [provider]  Continuous Blood Gluc Sensor (DEXCOM G6 SENSOR) MISC 1 Device by Does not apply route See admin instructions. Change every 10 days 08/18/21   Shamleffer, Melanie Crazier, MD  Continuous Blood Gluc Transmit (DEXCOM G6 TRANSMITTER) MISC Use as instructed to check blood sugar 08/18/21   Shamleffer, Melanie Crazier, MD  furosemide (LASIX) 40 MG tablet TAKE 1 TABLET BY MOUTH EVERY DAY Patient taking differently: Take 40 mg by mouth daily as needed for edema. 08/29/21   [name], [name], PA-C  Incontinence Supply Disposable (DISPOSABLE BRIEF LARGE) MISC 1 each by Does not apply route as needed. 02/24/21   [name], [name], PA-C  insulin aspart (NOVOLOG FLEXPEN) 100 UNIT/ML FlexPen Inject 4 Units into the skin 2 (two) times daily. When pump is not attached.    [provider]  Insulin Disposable Pump (OMNIPOD 5 G6 INTRO, GEN 5,) KIT 1 Device by Does not apply route every 3 (three) days. 11/09/20   Renato Shin, MD  Insulin Disposable Pump (OMNIPOD 5 G6 POD, GEN 5,) MISC APPLY EVERY 3 (THREE) DAYS. 09/06/21   Shamleffer, Melanie Crazier, MD  Lancets (ONETOUCH DELICA PLUS [id number]) MISC Apply 1 each topically 4 (four) times daily. 10/05/20   [provider]  lidocaine (XYLOCAINE) 5 % ointment Apply 1 Application topically as needed. 08/11/21   Loralee Pacas, MD  losartan (COZAAR) 25 MG tablet TAKE 1 TABLET BY MOUTH EVERY DAY Patient taking differently: Take 25 mg by mouth in the  morning. 09/26/21   [name], [name], PA-C  mirtazapine (REMERON) 15 MG tablet Take 1 tablet (15 mg total) by mouth at bedtime. 07/12/21   [name], [name], PA-C  NOVOLOG 100 UNIT/ML injection INJECT 50 UNITS INTO THE SKIN SEE ADMIN INSTRUCTIONS. PT LOADS PUMP WITH 50 UNITS EVERY 3 DAYS 01/01/22   Shamleffer, Melanie Crazier, MD  [facility] VERIO test strip 1 each 4 (four) times daily. 10/05/20   [provider]  PARoxetine (PAXIL) 20 MG tablet TAKE 1 TABLET BY MOUTH EVERY DAY Patient taking differently: Take 20 mg by mouth in the morning. 10/02/21   [name], [name], PA-C  spironolactone (ALDACTONE) 25 MG tablet TAKE 1 TABLET BY MOUTH EVERY DAY Patient taking differently: Take 25 mg by mouth in the morning. 10/02/21   [name], [name], PA-C   _________________________________________________________________________________ Physical Exam:    01/03/2022   10:00 PM 01/03/2022    8:45 PM 01/03/2022    8:30 PM  Vitals with BMI  Height _0     Weight 172 lbs 3 oz    BMI 83.3    Systolic 383    Diastolic 89    Pulse 50 50 43    1. General:  in No  Acute distress   Chronically ill  -appearing 2. Psychological: Alert and  Oriented to self situation Short term memory lapses, tangential  3. Head/ENT:    Dry Mucous Membranes                          Head  Non traumatic, neck supple                          Poor Dentition 4. SKIN: decreased Skin turgor,  Skin clean Dry and intact no rash 5. Heart: Regular rate and rhythm no  Murmur, no Rub or gallop 6. Lungs:  no wheezes or crackles   7. Abdomen: Soft,  non-tender, Non distended bowel sounds present 8. Lower extremities: no clubbing, cyanosis, 1 +edema 9. Neurologically Grossly intact, moving all 4 extremities equally   10. MSK: Normal range of motion  Chart has been reviewed  ______________________________________________________________________________________________  Assessment/Plan 67 y.o. male with medical history significant  of A-fib on Eliquis, CAD status post CABG, PAD, diastolic CHF, type 2 diabetes on insulin pump hypertension hyperlipidemia,   Admitted for   Bradycardia, Hallucinations    Present on Admission:  Atrial fibrillation with slow ventricular response (HCC)  Uncontrolled type 2 diabetes mellitus with hyperglycemia (HCC)  Coronary artery disease  Hypertension  PAF (paroxysmal atrial fibrillation) (HCC)  Hallucinations  Hematuria  Acute urinary retention  Chronic diastolic CHF (congestive heart failure) (HCC)     Atrial fibrillation with slow ventricular response (HCC) TSH WNL pt is asymptomatic not on BB  Monitor for tonight CArdiology consult in AM  Order echo  Uncontrolled type 2 diabetes mellitus with hyperglycemia (HCC) Order sliding scale continue insulin pump  Coronary artery disease Chronic stable continue Lipitor  Hypertension Resume Cozaar25 mg a day and spironolactone 25 mg a day  PAF (paroxysmal atrial fibrillation) (HCC) Continue Eliquis at home dose of 5 mg p.o. twice daily  Hallucinations Would benefit from further imaging such as MRI brain with and without Given progressive confusion suspect underlying dementia such as Lewy body dementia Patient would probably benefit from long-term follow-up with neurology Obtain EEG Check sed rate B12 folate and RPR May have issues with short term memory seems to repeat questions and is tangential Will also check thiamin level  And give thiamin   Hematuria Only small amount of urine was obtained.  Will repeat UA and father to evaluate.  Patient reports urinary retention.  Will check strict I's and O post void residual with bladder scan May benefit from further imaging  Acute urinary retention Could be contributoring to delirium Measure post void residual  Start Flomax 0.4 mg po q day Renal US to eval hydro Noted hematuria will need urology follow up  Pt refuses foley explained that if he is unable to urinate will need  foley placement to avoid kidney damage   Chronic diastolic CHF (congestive heart failure) ([place]) Appears intravascular depleted but mild leg edema noted Hold lasix for now  May need gentle rehydration  Check prealbumin  Other plan as per orders.  DVT prophylaxis:  SCD   Code Status:    Code Status: Prior FULL CODE  as per patient   I had personally discussed CODE STATUS with patient   Family Communication:   Family not at  Bedside  Disposition Plan:       To home once workup is complete and patient is stable    Following barriers for discharge:                            Electrolytes corrected  Will need consultants to evaluate patient prior to discharge                       Would benefit from PT/OT eval prior to DC  Ordered                                  Diabetes care coordinator                                       Consults called:    emailed cardiology  Admission status:  ED Disposition     ED Disposition  [place]: [place] [[id number]]  Level of Care: Progressive [102]  Admit to Progressive based on following criteria: CARDIOVASCULAR & THORACIC of moderate stability with acute coronary syndrome symptoms/low risk myocardial infarction/hypertensive urgency/arrhythmias/heart failure potentially compromising stability and stable post cardiovascular intervention patients.  Interfacility transfer: Yes  May place patient in observation at [facility] or [facility] if equivalent level of care is available:: No  Covid Evaluation: Asymptomatic - no recent exposure (last 10 days) testing not required  Diagnosis: Atrial fibrillation with slow ventricular response [facility]) [[id number]]  Admitting Physician: Lenore Cordia [[id number]]  Attending Physician: Charlesetta Shanks [[id number]]          Obs       Level of care  progressive tele indefinitely   COVID-19 Labs    Alesha Jaffee 01/04/2022, 1:37 AM    [facility]     after 2 AM please page floor coverage PA If 7AM-7PM, please contact the day team taking care of the patient using [email or web address]   Patient was evaluated in the context of the global COVID-19 pandemic, which necessitated consideration that the patient might be at risk for infection with the SARS-CoV-2 virus that causes COVID-19. Institutional protocols and algorithms that pertain to the evaluation of patients at risk for COVID-19 are in a state of rapid change based on information released by regulatory bodies including the CDC and federal and state organizations. These policies and algorithms were followed during the patient's care.

## 2022-01-03 NOTE — Assessment & Plan Note (Signed)
Order sliding scale continue insulin pump

## 2022-01-03 NOTE — ED Provider Triage Note (Signed)
Emergency Medicine Provider Triage Evaluation Note  [name] [name] , a 67 y.o. male  was evaluated in triage.  Pt complains of visual hallucinations onset several days. Son at bedside notes that patient has been seeing animals. Pt denies hallucinations currently. Pt is on paxil, however, son notes that the patient is not as compliant with this medication. Son notes that patient has an insulin pump. Pt denies chest pain, trouble breathing, abdominal pain, nausea, vomiting, urinary symptoms.   Review of Systems  Positive:  Negative:   Physical Exam  BP (!) 162/102 (BP Location: Right Arm)   Pulse 80   Temp 97.9 F (36.6 C) (Oral)   Resp 18   Ht '6\' 2"'$  (1.88 m)   Wt 74.8 kg   SpO2 96%   BMI 21.17 kg/m  Gen:   Awake, no distress   Resp:  Normal effort  MSK:   Moves extremities without difficulty  Other:    Medical Decision Making  Medically screening exam initiated at 12:27 PM.  Appropriate orders placed.  [name] [name] was informed that the remainder of the evaluation will be completed by another provider, this initial triage assessment does not replace that evaluation, and the importance of remaining in the ED until their evaluation is complete.   Dallen Bunte A, PA-C 01/03/22 1239

## 2022-01-03 NOTE — H&P (Incomplete)
[name] [name] RFV:[id number] DOB: 07/06/1955 DOA: 01/03/2022 PCP: Fredirick Lathe, PA-C   Outpatient Specialists:   Cardiology [facility]ology  Endo  Shamleffer, Melanie Crazier, MD   Patient arrived to ER on 01/03/22 at 1151 Referred by Attending No att. providers found   Patient coming from:    home Lives  With family    Chief Complaint:   Chief Complaint  Patient presents with  . Hallucinations    HPI: [name] [name] is a 67 y.o. male with medical history significant of A-fib on Eliquis, CAD status post CABG, PAD, diastolic CHF, type 2 diabetes on insulin pump hypertension hyperlipidemia,    Presented with   new hallucinations Has been progressively needing more assistance over the past 1 year but recently has been having more hallucinations which is not typical for the patient seen large bugs and lizards patient is aware this denies alcohol but does smoke sick cars on occasion Illicit issues do not seem to be distressing for the patient While in the emergency department noted that his A-fib with slow ventricular response down to 30s rare bacteria in his urine Does not smoke or drink He had a fall down stairs since 2022 with multiple fractures Family has been living with him Pt believes he actually seen the Lizards and spiders Thinks it was a pet of his neighbor  Reports he has added lasix to his meds at night  He has been urinating more frequently, with some retention  Regarding pertinent Chronic problems:     Hyperlipidemia - on statins Lipitor (atorvastatin)  Lipid Panel     Component Value Date/Time   CHOL 78 02/16/2021 0434   TRIG 26 02/16/2021 0434   HDL 39 (L) 02/16/2021 0434   CHOLHDL 2.0 02/16/2021 0434   VLDL 5 02/16/2021 0434   LDLCALC 34 02/16/2021 0434   LDLDIRECT 37.0 03/16/2020 0958     HTN on cozaar   chronic CHF diastolic  - last echo Feb 4035 Grade I diastolic dysfunction  On lasix, spironolactone   CAD  - On  statin,                   - followed by cardiology                - last cardiac cath       DM 2 -  Lab Results  Component Value Date   HGBA1C 7.8 (H) 10/23/2021   on insulin, pump       Chronic anemia - baseline hg Hemoglobin & Hematocrit  Recent Labs    10/23/21 0936 01/03/22 1235 01/03/22 1910  HGB 11.4* 12.6* 12.9*     While in ER:    TSH within normal limits troponin 18   CT HEAD   NON acute  CXR - ***NON acute    Following Medications were ordered in ER: Medications - No data to display      ED Triage Vitals  Enc Vitals Group     BP 01/03/22 1224 (!) 162/102     Pulse Rate 01/03/22 1224 80     Resp 01/03/22 1224 18     Temp 01/03/22 1224 97.9 F (36.6 C)     Temp Source 01/03/22 1224 Oral     SpO2 01/03/22 1224 96 %     Weight 01/03/22 1221 164 lb 14.5 oz (74.8 kg)     Height 01/03/22 1221 _0  (1.88 m)     Head Circumference --  Peak Flow --      Pain Score 01/03/22 2105 0     Pain Loc --      Pain Edu? --      Excl. in [place]? --   TMAX(24)@     _________________________________________ Significant initial  Findings: Abnormal Labs Reviewed  COMPREHENSIVE METABOLIC PANEL - Abnormal; Notable for the following components:      Result Value   Sodium 134 (*)    Chloride 95 (*)    Glucose, Bld 302 (*)    Calcium 8.8 (*)    Albumin 3.3 (*)    All other components within normal limits  CBC - Abnormal; Notable for the following components:   Hemoglobin 12.6 (*)    HCT 38.7 (*)    RDW 15.7 (*)    All other components within normal limits  URINALYSIS, ROUTINE W REFLEX MICROSCOPIC - Abnormal; Notable for the following components:   APPearance HAZY (*)    Glucose, UA >=500 (*)    Hgb urine dipstick LARGE (*)    Protein, ur >300 (*)    Leukocytes,Ua MODERATE (*)    All other components within normal limits  ACETAMINOPHEN LEVEL - Abnormal; Notable for the following components:   Acetaminophen (Tylenol), Serum <10 (*)    All other components within normal limits   SALICYLATE LEVEL - Abnormal; Notable for the following components:   Salicylate Lvl <8.0 (*)    All other components within normal limits  URINALYSIS, MICROSCOPIC (REFLEX) - Abnormal; Notable for the following components:   Bacteria, UA RARE (*)    All other components within normal limits  GLUCOSE, CAPILLARY - Abnormal; Notable for the following components:   Glucose-Capillary 121 (*)    All other components within normal limits  CBG MONITORING, ED - Abnormal; Notable for the following components:   Glucose-Capillary 307 (*)    All other components within normal limits  I-STAT VENOUS BLOOD GAS, ED - Abnormal; Notable for the following components:   Bicarbonate 31.3 (*)    TCO2 33 (*)    Acid-Base Excess 5.0 (*)    HCT 38.0 (*)    Hemoglobin 12.9 (*)    All other components within normal limits  TROPONIN I (HIGH SENSITIVITY) - Abnormal; Notable for the following components:   Troponin I (High Sensitivity) 20 (*)    All other components within normal limits  TROPONIN I (HIGH SENSITIVITY) - Abnormal; Notable for the following components:   Troponin I (High Sensitivity) 18 (*)    All other components within normal limits    _________________________ Troponin 18 -20 ECG: Ordered Personally reviewed and interpreted by me showing: HR : 46 Rhythm:  A.fib. W SVR,     no evidence of ischemic changes QTC 483    The recent clinical data is shown below. Vitals:   01/03/22 2030 01/03/22 2037 01/03/22 2045 01/03/22 2200  BP:    (!) 174/89  Pulse: (!) 43  (!) 50 (!) 50  Resp: _0 Temp:  98.1 F (36.7 C)  98.1 F (36.7 C)  TempSrc:  Oral  Oral  SpO2: 98%  99% 98%  Weight:    78.1 kg  Height:    _1  (1.88 m)     WBC     Component Value Date/Time   WBC 5.0 01/03/2022 1235   LYMPHSABS 0.9 07/17/2021 2153   MONOABS 0.8 07/17/2021 2153   EOSABS 0.0 07/17/2021 2153   BASOSABS 0.0 07/17/2021 2153  UA abnormal   Urine analysis:    Component Value Date/Time    COLORURINE YELLOW 01/03/2022 1235   APPEARANCEUR HAZY (A) 01/03/2022 1235   LABSPEC 1.015 01/03/2022 1235   PHURINE 6.0 01/03/2022 1235   GLUCOSEU >=500 (A) 01/03/2022 1235   GLUCOSEU >=1000 (A) 03/16/2020 0958   HGBUR LARGE (A) 01/03/2022 1235   BILIRUBINUR NEGATIVE 01/03/2022 1235   KETONESUR NEGATIVE 01/03/2022 1235   PROTEINUR >300 (A) 01/03/2022 1235   UROBILINOGEN 0.2 03/16/2020 0958   NITRITE NEGATIVE 01/03/2022 1235   LEUKOCYTESUR MODERATE (A) 01/03/2022 1235    _______________________________________________ Hospitalist was called for admission for   Bradycardia,    Hallucinations    The following Work up has been ordered so far:  Orders Placed This Encounter  Procedures  . CT Head Wo Contrast  . Comprehensive metabolic panel  . CBC  . Urinalysis, Routine w reflex microscopic  . Acetaminophen level  . Salicylate level  . Ethanol  . Urinalysis, Microscopic (reflex)  . Ammonia  . TSH  . Magnesium  . Phosphorus  . Vitamin B12  . Folate  . RPR  . Glucose, capillary  . Diet NPO time specified  . Document Height and Actual Weight  . Cardiac Monitoring - Continuous Indefinite  . Consult to hospitalist  . Inpatient consult to Cardiology  . CBG monitoring, ED  . CBG monitoring, ED  . I-Stat venous blood gas, ED  . ED EKG  . Place in observation (patient's expected length of stay will be less than 2 midnights)     OTHER Significant initial  Findings:  labs showing:  Recent Labs  Lab 01/03/22 1235 01/03/22 1910 01/03/22 2006  NA 134* 137  --   K 3.5 3.5  --   CO2 32  --   --   GLUCOSE 302*  --   --   BUN 14  --   --   CREATININE 0.76  --   --   CALCIUM 8.8*  --   --   MG  --   --  1.7  PHOS  --   --  3.6    Cr  stable,    Lab Results  Component Value Date   CREATININE 0.76 01/03/2022   CREATININE 0.73 10/23/2021   CREATININE 1.16 07/17/2021    Recent Labs  Lab 01/03/22 1235  AST 16  ALT 7  ALKPHOS 93  BILITOT 0.7  PROT 6.6  ALBUMIN  3.3*   Lab Results  Component Value Date   CALCIUM 8.8 (L) 01/03/2022   PHOS 3.6 01/03/2022    Plt: Lab Results  Component Value Date   PLT 176 01/03/2022    COVID-19 Labs  No results for input(s): "DDIMER", "FERRITIN", "LDH", "CRP" in the last 72 hours.  Lab Results  Component Value Date   SARSCOV2NAA POSITIVE (A) 07/17/2021   SARSCOV2NAA NEGATIVE 02/14/2021   [place] NEGATIVE 05/24/2020   [place] NEGATIVE 05/16/2020      Recent Labs  Lab 01/03/22 1235 01/03/22 1910  WBC 5.0  --   HGB 12.6* 12.9*  HCT 38.7* 38.0*  MCV 88.6  --   PLT 176  --   HG/HCT   stable,     Component Value Date/Time   HGB 12.9 (L) 01/03/2022 1910   HCT 38.0 (L) 01/03/2022 1910   MCV 88.6 01/03/2022 1235     No results for input(s): "LIPASE", "AMYLASE" in the last 168 hours. Recent Labs  Lab 01/03/22 1907  AMMONIA 34  VBG   7.387 Potassium 3.5 mmol/L  pCO2, Ven 52.1 mmHg     Cardiac Panel (last 3 results) No results for input(s): "CKTOTAL", "CKMB", "TROPONINI", "RELINDX" in the last 72 hours.  .car BNP (last 3 results) Recent Labs    02/14/21 0926 07/17/21 2153  BNP 187.4* 146.2*    DM  labs:  HbA1C: Recent Labs    04/20/21 0824 08/18/21 1208 10/23/21 0936  HGBA1C 9.2* 9.7* 7.8*    CBG (last 3)  Recent Labs    01/03/22 1228 01/03/22 2209  GLUCAP 307* 121*      Cultures:    Component Value Date/Time   SDES  10/13/2020 1030    HEEL LEFT Performed at [facility], [place] [address] [place], [place] [address]    [place]  10/13/2020 1030    NONE Performed at [facility], [place] [address] [place], [place] [address]    CULT  10/13/2020 1030    ABUNDANT PROTEUS MIRABILIS ABUNDANT CITROBACTER FREUNDII WITHIN MIXED ORGANISMS NO ANAEROBES ISOLATED Performed at [facility], [place] [address], [place], [place] [address]    REPTSTATUS 10/18/2020 FINAL 10/13/2020 1030     Radiological Exams on Admission: CT  Head Wo Contrast  Result Date: 01/03/2022 CLINICAL DATA:  Hallucinations EXAM: CT HEAD WITHOUT CONTRAST TECHNIQUE: Contiguous axial images were obtained from the base of the skull through the vertex without intravenous contrast. RADIATION DOSE REDUCTION: This exam was performed according to the departmental dose-optimization program which includes automated exposure control, adjustment of the mA and/or kV according to patient size and/or use of iterative reconstruction technique. COMPARISON:  05/16/2020 FINDINGS: Brain: No acute intracranial findings are seen. There are no signs of bleeding within the cranium. Cortical sulci are prominent. There is decreased density in periventricular white matter. Vascular: Scattered arterial calcifications are seen. Skull: Unremarkable. Sinuses/Orbits: Unremarkable. Other: None. IMPRESSION: No acute intracranial findings are seen in noncontrast CT brain. Atrophy. Small-vessel disease. Electronically Signed   By: Elmer Picker M.D.   On: 01/03/2022 12:54   _______________________________________________________________________________________________________ Latest  Blood pressure (!) 174/89, pulse (!) 50, temperature 98.1 F (36.7 C), temperature source Oral, resp. rate 18, height _0  (1.88 m), weight 78.1 kg, SpO2 98 %.   Vitals  labs and radiology finding personally reviewed  Review of Systems:    Pertinent positives include:  fatigue, hallucinations  Constitutional:  No weight loss, night sweats, Fevers, chills,  weight loss  HEENT:  No headaches, Difficulty swallowing,Tooth/dental problems,Sore throat,  No sneezing, itching, ear ache, nasal congestion, post nasal drip,  Cardio-vascular:  No chest pain, Orthopnea, PND, anasarca, dizziness, palpitations.no Bilateral lower extremity swelling  GI:  No heartburn, indigestion, abdominal pain, nausea, vomiting, diarrhea, change in bowel habits, loss of appetite, melena, blood in stool, hematemesis Resp:   no shortness of breath at rest. No dyspnea on exertion, No excess mucus, no productive cough, No non-productive cough, No coughing up of blood.No change in color of mucus.No wheezing. Skin:  no rash or lesions. No jaundice GU:  no dysuria, change in color of urine, no urgency or frequency. No straining to urinate.  No flank pain.  Musculoskeletal:  No joint pain or no joint swelling. No decreased range of motion. No back pain.  Psych:  No change in mood or affect. No depression or anxiety. No memory loss.  Neuro: no localizing neurological complaints, no tingling, no weakness, no double vision, no gait abnormality, no slurred speech, no confusion  All systems reviewed and  apart from [place] all are negative _______________________________________________________________________________________________ Past Medical History:   Past Medical History:  Diagnosis Date  . Anxiety   . Arthritis   . Cataract   . CHF (congestive heart failure) ([place])   . Constipation    pt states goes EOD- hard stools   . Coronary artery disease   . Depression   . Diabetes mellitus without complication ([place])   . Diabetic retinopathy ([place])   . DKA (diabetic ketoacidoses)   . Hyperlipidemia   . Hypertension    off meds  . Hypertensive retinopathy   . Postherpetic neuralgia 08/11/2021   Speculative diagnosis  . Psoriasis   . Tubular adenoma of colon 03/2019     Past Surgical History:  Procedure Laterality Date  . ABDOMINAL AORTOGRAM W/LOWER EXTREMITY N/A 08/30/2020   Procedure: ABDOMINAL AORTOGRAM W/LOWER EXTREMITY;  Surgeon: Serafina Mitchell, MD;  Location: [place] [facility];  Service: Cardiovascular;  Laterality: N/A;  . ABDOMINAL AORTOGRAM W/LOWER EXTREMITY N/A 11/15/2020   Procedure: ABDOMINAL AORTOGRAM W/LOWER EXTREMITY;  Surgeon: Serafina Mitchell, MD;  Location: [place] [facility];  Service: Cardiovascular;  Laterality: N/A;  . ANKLE SURGERY     right  . CARDIAC CATHETERIZATION  09/25/2001  .  COLONOSCOPY     ~10 yr ago- normal  per pt   . CORONARY ARTERY BYPASS GRAFT  09/30/2001   CABG x 4  . IR THORACENTESIS ASP PLEURAL SPACE W/IMG GUIDE  05/19/2020  . LAPAROSCOPIC INGUINAL HERNIA REPAIR Bilateral 02/09/2010  . MASS EXCISION Left 03/12/2013   Procedure: LEFT INDEX EXCISION MASS AND DEBRIDMENT DISTAL INTERPHALANGEAL JOINT;  Surgeon: Tennis Must, MD;  Location: [place];  Service: Orthopedics;  Laterality: Left;  . PERIPHERAL VASCULAR BALLOON ANGIOPLASTY Left 08/30/2020   Procedure: PERIPHERAL VASCULAR BALLOON ANGIOPLASTY;  Surgeon: Serafina Mitchell, MD;  Location: [place] [facility];  Service: Cardiovascular;  Laterality: Left;  Peroneal    Social History:  Ambulatory   independently      reports that he quit smoking about 2 years ago. His smoking use included cigars. He has never used smokeless tobacco. He reports current alcohol use. He reports that he does not use drugs.   Family History:  Family History  Problem Relation Age of Onset  . Lymphoma Father   . Cancer Paternal Grandmother        Colon Cancer  . Colon cancer Paternal Grandmother   . Colon polyps Neg Hx   . Esophageal cancer Neg Hx   . Rectal cancer Neg Hx   . Stomach cancer Neg Hx   . Diabetes Mellitus I Neg Hx   . Pancreatic cancer Neg Hx    ____________________________________________________________________________ Allergies: Allergies  Allergen Reactions  . Wool Alcohol [Lanolin] Hives    Wool fabric    Prior to Admission medications   Medication Sig Start Date End Date Taking? Authorizing Provider  acetaminophen (TYLENOL) 500 MG tablet Take 1,000 mg by mouth every 6 (six) hours as needed for mild pain or moderate pain.    [provider]  apixaban (ELIQUIS) 5 MG TABS tablet Take 1 tablet (5 mg total) by mouth 2 (two) times daily. PLEASE SCHEDULE APPT WITH ALYSSA FOR FURTHER REFILLS [phone] Patient taking differently: Take 5 mg by mouth 2 (two) times daily. 07/05/21    [name], [name], PA-C  aspirin EC 81 MG EC tablet Take 1 tablet (81 mg total) by mouth daily. Patient taking differently: Take 81 mg by mouth in the morning.  02/18/21   Domenic Polite, MD  atorvastatin (LIPITOR) 40 MG tablet TAKE 1 TABLET BY MOUTH EVERY DAY Patient taking differently: Take 40 mg by mouth in the morning. 09/26/21   [name], [name], PA-C  BD PEN NEEDLE NANO 2ND GEN 32G X 4 MM MISC USE FOR INSULIN 10/05/20   Janith Lima, MD  Blood Glucose Monitoring Suppl ([employer]) w/Device KIT  10/07/20   [provider]  Continuous Blood Gluc Sensor (DEXCOM G6 SENSOR) MISC 1 Device by Does not apply route See admin instructions. Change every 10 days 08/18/21   Shamleffer, Melanie Crazier, MD  Continuous Blood Gluc Transmit (DEXCOM G6 TRANSMITTER) MISC Use as instructed to check blood sugar 08/18/21   Shamleffer, Melanie Crazier, MD  furosemide (LASIX) 40 MG tablet TAKE 1 TABLET BY MOUTH EVERY DAY Patient taking differently: Take 40 mg by mouth daily as needed for edema. 08/29/21   [name], [name], PA-C  Incontinence Supply Disposable (DISPOSABLE BRIEF LARGE) MISC 1 each by Does not apply route as needed. 02/24/21   [name], [name], PA-C  insulin aspart (NOVOLOG FLEXPEN) 100 UNIT/ML FlexPen Inject 4 Units into the skin 2 (two) times daily. When pump is not attached.    [provider]  Insulin Disposable Pump (OMNIPOD 5 G6 INTRO, GEN 5,) KIT 1 Device by Does not apply route every 3 (three) days. 11/09/20   Renato Shin, MD  Insulin Disposable Pump (OMNIPOD 5 G6 POD, GEN 5,) MISC APPLY EVERY 3 (THREE) DAYS. 09/06/21   Shamleffer, Melanie Crazier, MD  Lancets (ONETOUCH DELICA PLUS [id number]) MISC Apply 1 each topically 4 (four) times daily. 10/05/20   [provider]  lidocaine (XYLOCAINE) 5 % ointment Apply 1 Application topically as needed. 08/11/21   Loralee Pacas, MD  losartan (COZAAR) 25 MG tablet TAKE 1 TABLET BY MOUTH EVERY DAY Patient taking  differently: Take 25 mg by mouth in the morning. 09/26/21   [name], [name], PA-C  mirtazapine (REMERON) 15 MG tablet Take 1 tablet (15 mg total) by mouth at bedtime. 07/12/21   [name], [name], PA-C  NOVOLOG 100 UNIT/ML injection INJECT 50 UNITS INTO THE SKIN SEE ADMIN INSTRUCTIONS. PT LOADS PUMP WITH 50 UNITS EVERY 3 DAYS 01/01/22   Shamleffer, Melanie Crazier, MD  [facility] VERIO test strip 1 each 4 (four) times daily. 10/05/20   [provider]  PARoxetine (PAXIL) 20 MG tablet TAKE 1 TABLET BY MOUTH EVERY DAY Patient taking differently: Take 20 mg by mouth in the morning. 10/02/21   [name], [name], PA-C  spironolactone (ALDACTONE) 25 MG tablet TAKE 1 TABLET BY MOUTH EVERY DAY Patient taking differently: Take 25 mg by mouth in the morning. 10/02/21   [name], [name], PA-C   _________________________________________________________________________________ Physical Exam:    01/03/2022   10:00 PM 01/03/2022    8:45 PM 01/03/2022    8:30 PM  Vitals with BMI  Height _0     Weight 172 lbs 3 oz    BMI 41.9    Systolic 622    Diastolic 89    Pulse 50 50 43    1. General:  in No  Acute distress   Chronically ill  -appearing 2. Psychological: Alert and  Oriented to self situation  3. Head/ENT:    Dry Mucous Membranes                          Head Non traumatic, neck supple  Poor Dentition 4. SKIN: decreased Skin turgor,  Skin clean Dry and intact no rash 5. Heart: Regular rate and rhythm no  Murmur, no Rub or gallop 6. Lungs:  no wheezes or crackles   7. Abdomen: Soft,  non-tender, Non distended *** obese ***bowel sounds present 8. Lower extremities: no clubbing, cyanosis, no ***edema 9. Neurologically Grossly intact, moving all 4 extremities equally *** strength 5 out of 5 in all 4 extremities cranial nerves II through XII intact 10. MSK: Normal range of motion  Chart has been  reviewed  ______________________________________________________________________________________________  Assessment/Plan 67 y.o. male with medical history significant of A-fib on Eliquis, CAD status post CABG, PAD, diastolic CHF, type 2 diabetes on insulin pump hypertension hyperlipidemia,   Admitted for   Bradycardia, Hallucinations    Present on Admission: . Atrial fibrillation with slow ventricular response ([place]) . Uncontrolled type 2 diabetes mellitus with hyperglycemia ([place]) . Coronary artery disease . Hypertension . PAF (paroxysmal atrial fibrillation) ([place]) . Hallucinations     Atrial fibrillation with slow ventricular response (HCC) TSH WNL pt is asymptomatic not on BB  Monitor for tonight CArdiology consult in AM  Order echo  Uncontrolled type 2 diabetes mellitus with hyperglycemia (HCC) Order sliding scale continue insulin pump  Coronary artery disease Chronic stable continue Lipitor  Hypertension Resume Cozaar25 mg a day and spironolactone 25 mg a day  PAF (paroxysmal atrial fibrillation) (HCC) Continue Eliquis at home dose of 5 mg p.o. twice daily  Hallucinations Would benefit from further imaging such as MRI brain with and without Given progressive confusion suspect underlying dementia such as Lewy body dementia Patient would probably benefit from long-term follow-up with neurology Obtain EEG Check sed rate B12 folate and RPR  Other plan as per orders.  DVT prophylaxis:  SCD   Code Status:    Code Status: Prior FULL CODE  as per patient   I had personally discussed CODE STATUS with patient   Family Communication:   Family not at  Bedside  Disposition Plan:       To home once workup is complete and patient is stable    Following barriers for discharge:                            Electrolytes corrected                                                         Will need consultants to evaluate patient prior to discharge                       Would  benefit from PT/OT eval prior to DC  Ordered                                  Diabetes care coordinator                                       Consults called: ***  emailed cardiology  Admission status:  ED Disposition     ED Disposition  [facility]  Area: [facility] [[id number]]  Level of Care: Progressive [102]  Admit to Progressive based on following criteria: CARDIOVASCULAR & THORACIC of moderate stability with acute coronary syndrome symptoms/low risk myocardial infarction/hypertensive urgency/arrhythmias/heart failure potentially compromising stability and stable post cardiovascular intervention patients.  Interfacility transfer: Yes  May place patient in observation at [facility] or [facility] if equivalent level of care is available:: No  Covid Evaluation: Asymptomatic - no recent exposure (last 10 days) testing not required  Diagnosis: Atrial fibrillation with slow ventricular response ([place]) [[id number]]  Admitting Physician: Lenore Cordia [[id number]]  Attending Physician: Charlesetta Shanks [[id number]]           Obs***  ***  inpatient     I Expect 2 midnight stay secondary to severity of patient's current illness need for inpatient interventions justified by the following: ***hemodynamic instability despite optimal treatment (tachycardia *hypotension * tachypnea *hypoxia, hypercapnia) * Severe lab/radiological/exam abnormalities including:     and extensive comorbidities including: *substance abuse  *Chronic pain *DM2  * CHF * CAD  * COPD/asthma *Morbid Obesity * CKD *dementia *liver disease *history of stroke with residual deficits *  malignancy, * sickle cell disease  History of amputation Chronic anticoagulation  That are currently affecting medical management.   I expect  patient to be hospitalized for 2 midnights requiring inpatient medical care.  Patient is at high risk for adverse outcome (such as loss of  life or disability) if not treated.  Indication for inpatient stay as follows:  Severe change from baseline regarding mental status Hemodynamic instability despite maximal medical therapy,  ongoing suicidal ideations,  severe pain requiring acute inpatient management,  inability to maintain oral hydration   persistent chest pain despite medical management Need for operative/procedural  intervention New or worsening hypoxia   Need for , IV fluids,      Level of care  progressive tele indefinitely please discontinue once patient no longer qualifies COVID-19 Labs   Zayvien Canning 01/03/2022, 11:21 PM    [facility]     after 2 AM please page floor coverage PA If 7AM-7PM, please contact the day team taking care of the patient using [email or web address]   Patient was evaluated in the context of the global COVID-19 pandemic, which necessitated consideration that the patient might be at risk for infection with the SARS-CoV-2 virus that causes COVID-19. Institutional protocols and algorithms that pertain to the evaluation of patients at risk for COVID-19 are in a state of rapid change based on information released by regulatory bodies including the CDC and federal and state organizations. These policies and algorithms were followed during the patient's care.

## 2022-01-03 NOTE — Assessment & Plan Note (Signed)
Chronic stable continue Lipitor ?

## 2022-01-03 NOTE — ED Notes (Signed)
[facility] at bedside. Pt stable for transport. 

## 2022-01-03 NOTE — Progress Notes (Signed)
Insulin pump admission assessment performed concurrently with primary RN.

## 2022-01-03 NOTE — ED Triage Notes (Signed)
Patient here POV from Home with Family.   Family endorses recently over the past few days the Patient has been visualizing things that are not there.   No New Pains. No Discernable Dysuria. No Fevers. States he is aware of when this Visions are occurring.   NAD Noted during Triage. A&Ox4. GCS 15. Ambulatory.

## 2022-01-03 NOTE — Assessment & Plan Note (Addendum)
Only small amount of urine was obtained.  Will repeat UA and father to evaluate.  Patient reports urinary retention.  Will check strict I's and O post void residual with bladder scan May benefit from further imaging

## 2022-01-03 NOTE — Subjective & Objective (Signed)
Has been progressively needing more assistance over the past 1 year but recently has been having more hallucinations which is not typical for the patient seen large bugs and lizards patient is aware this denies alcohol but does smoke sick cars on occasion Illicit issues do not seem to be distressing for the patient

## 2022-01-03 NOTE — Assessment & Plan Note (Addendum)
Would benefit from further imaging such as MRI brain with and without Given progressive confusion suspect underlying dementia such as Lewy body dementia Patient would probably benefit from long-term follow-up with neurology Obtain EEG Check sed rate B12 folate and RPR May have issues with short term memory seems to repeat questions and is tangential Will also check thiamin level  And give thiamin

## 2022-01-03 NOTE — Progress Notes (Signed)
Plan of Care Note for accepted transfer   Patient: Demetric Dunnaway MRN: [id number]   DOA: 01/03/2022  Facility requesting transfer: [facility] Requesting Provider: Charlesetta Shanks, MD (Emergency Medicine) Reason for transfer: Bradycardia and hallucinations Facility course:   Patient is a 67 year old male with history of PAF on Eliquis, HFpEF, CAD s/p CABG, PAD, type 2 diabetes using insulin pump, HTN, HLD who presented to the ED for evaluation of new hallucinations last few days.  While in the ED he was noted to have atrial fibrillation with slow ventricular response/bradycardia with heart rate in the 30s.  Labs notable for hyperglycemia with glucose 302, no evidence of DKA.  Potassium is 3.5.  Urinalysis showed negative nitrates, moderate leukocytes, >50 WBCs, rare bacteria on microscopy.  CT head without contrast negative for acute findings.  TRH consulted to admit for further evaluation and management of bradycardia and hallucinations.  Plan of care: Recommend obtaining TSH and magnesium levels and discussing with on-call cardiology.  The patient is tentatively accepted for admission to Progressive unit, at [facility].  If patient develops symptomatic bradycardia may require ED to ED transfer for expedited cardiology evaluation and pacemaker consideration.  Author: Zada Finders, MD 01/03/2022  Check [email or web address] for on-call coverage.  Nursing staff, Please call [facility] number on Amion as soon as patient's arrival, so appropriate admitting provider can evaluate the pt.

## 2022-01-03 NOTE — Assessment & Plan Note (Signed)
Continue Eliquis at home dose of 5 mg p.o. twice daily

## 2022-01-03 NOTE — ED Provider Notes (Signed)
[facility] EMERGENCY DEPT Provider Note   CSN: [id number] Arrival date & time: 01/03/22  1151     History  Chief Complaint  Patient presents with   Hallucinations    [name] [name] is a 67 y.o. male.  HPI Review of EMR indicates history of paroxysmal atrial fibrillation.  Patient also has history of hypertension and diabetes.  Patient lives at home with his son.  He has been requiring increasing assistance over the past year.  Per the patient and his son the patient is still participating in writing checks and self-care but has missed a lot of medications over the past couple weeks, reportedly due to the holidays from the patient's perspective.  Over the past several days there have been hallucinations.  Patient's son reports that that is atypical.  The patient has been hallucinating large bugs and lizards.  The patient has awareness of this.  He reports that last night he went out side because he was carried seen his son spraying for bugs.  And then he saw along the line of spiders going up towards a broken window.  Patient is pretty matter-of-fact about this description and it does not seem to be disturbing.  Patient reports that he went outside and he smoked a cigar, which he occasionally does.  He does not drink alcohol with his cigar except very rarely.  He reports he saw a very large monitor lizard about 6 feet away from him and just decided to remain calm, not disturb it and finished a cigar and then carefully go inside.  Again, this does not seem to be distressing for the patient.  He does not drink any significant mount alcohol.  His son reports if he had 2 drinks over the course of the year, that is probably a lot.  No history of alcohol abuse.  No smoking history.  The patient has been unable to drive for about a year.  His 24+-year-old mother is taking him to some of his appointments and doing the driving.  The patient reports he gets cold pretty easily so he wears  his jacket in the house and is got a pair of gloves that he wears consistently because his hands are always cold.    Home Medications Prior to Admission medications   Medication Sig Start Date End Date Taking? Authorizing Provider  acetaminophen (TYLENOL) 500 MG tablet Take 1,000 mg by mouth every 6 (six) hours as needed for mild pain or moderate pain.    [provider]  apixaban (ELIQUIS) 5 MG TABS tablet Take 1 tablet (5 mg total) by mouth 2 (two) times daily. PLEASE SCHEDULE APPT WITH ALYSSA FOR FURTHER REFILLS [phone] Patient taking differently: Take 5 mg by mouth 2 (two) times daily. 07/05/21   [name], [name], PA-C  aspirin EC 81 MG EC tablet Take 1 tablet (81 mg total) by mouth daily. Patient taking differently: Take 81 mg by mouth in the morning. 02/18/21   Domenic Polite, MD  atorvastatin (LIPITOR) 40 MG tablet TAKE 1 TABLET BY MOUTH EVERY DAY Patient taking differently: Take 40 mg by mouth in the morning. 09/26/21   [name], [name], PA-C  BD PEN NEEDLE NANO 2ND GEN 32G X 4 MM MISC USE FOR INSULIN 10/05/20   Janith Lima, MD  Blood Glucose Monitoring Suppl ([place]) w/Device KIT  10/07/20   [provider]  Continuous Blood Gluc Sensor (DEXCOM G6 SENSOR) MISC 1 Device by Does not apply route See  admin instructions. Change every 10 days 08/18/21   Shamleffer, Melanie Crazier, MD  Continuous Blood Gluc Transmit (DEXCOM G6 TRANSMITTER) MISC Use as instructed to check blood sugar 08/18/21   Shamleffer, Melanie Crazier, MD  furosemide (LASIX) 40 MG tablet TAKE 1 TABLET BY MOUTH EVERY DAY Patient taking differently: Take 40 mg by mouth daily as needed for edema. 08/29/21   [name], [name], PA-C  Incontinence Supply Disposable (DISPOSABLE BRIEF LARGE) MISC 1 each by Does not apply route as needed. 02/24/21   [name], [name], PA-C  insulin aspart (NOVOLOG FLEXPEN) 100 UNIT/ML FlexPen Inject 4 Units into the skin 2 (two) times daily. When pump is  not attached.    [provider]  Insulin Disposable Pump (OMNIPOD 5 G6 INTRO, GEN 5,) KIT 1 Device by Does not apply route every 3 (three) days. 11/09/20   Renato Shin, MD  Insulin Disposable Pump (OMNIPOD 5 G6 POD, GEN 5,) MISC APPLY EVERY 3 (THREE) DAYS. 09/06/21   Shamleffer, Melanie Crazier, MD  Lancets (ONETOUCH DELICA PLUS [id number]) MISC Apply 1 each topically 4 (four) times daily. 10/05/20   [provider]  lidocaine (XYLOCAINE) 5 % ointment Apply 1 Application topically as needed. 08/11/21   Loralee Pacas, MD  losartan (COZAAR) 25 MG tablet TAKE 1 TABLET BY MOUTH EVERY DAY Patient taking differently: Take 25 mg by mouth in the morning. 09/26/21   [name], [name], PA-C  mirtazapine (REMERON) 15 MG tablet Take 1 tablet (15 mg total) by mouth at bedtime. 07/12/21   [name], [name], PA-C  NOVOLOG 100 UNIT/ML injection INJECT 50 UNITS INTO THE SKIN SEE ADMIN INSTRUCTIONS. PT LOADS PUMP WITH 50 UNITS EVERY 3 DAYS 01/01/22   Shamleffer, Melanie Crazier, MD  [facility] VERIO test strip 1 each 4 (four) times daily. 10/05/20   [provider]  PARoxetine (PAXIL) 20 MG tablet TAKE 1 TABLET BY MOUTH EVERY DAY Patient taking differently: Take 20 mg by mouth in the morning. 10/02/21   [name], [name], PA-C  spironolactone (ALDACTONE) 25 MG tablet TAKE 1 TABLET BY MOUTH EVERY DAY Patient taking differently: Take 25 mg by mouth in the morning. 10/02/21   [name], [name], PA-C      Allergies    Wool alcohol [lanolin]    Review of Systems   Review of Systems  Physical Exam Updated Vital Signs BP (!) 180/107   Pulse (!) 43   Temp 98.1 F (36.7 C) (Oral)   Resp 20   Ht _0  (1.88 m)   Wt 74.8 kg   SpO2 98%   BMI 21.17 kg/m  Physical Exam Constitutional:      Comments: Patient is alert.  He is interactive.  He is very talkative.  No respiratory distress at rest.  Grooming is suboptimal but patient is not disheveled.   HENT:     Head: Normocephalic and  atraumatic.     Comments: Scalp indicates lack of hair care and attention to personal hygiene.    Nose: Nose normal.     Mouth/Throat:     Mouth: Mucous membranes are moist.     Pharynx: Oropharynx is clear.  Eyes:     Extraocular Movements: Extraocular movements intact.     Pupils: Pupils are equal, round, and reactive to light.  Cardiovascular:     Rate and Rhythm: Bradycardia present.     Comments: Heart is very bradycardic.  Monitor shows rates in the mid 30s to mid 40s.  Irregularly irregular.  Monitor  consistent with slow atrial fibrillation. Pulmonary:     Effort: Pulmonary effort is normal.     Breath sounds: Normal breath sounds.  Abdominal:     General: There is no distension.     Palpations: Abdomen is soft.     Tenderness: There is no abdominal tenderness. There is no guarding.  Musculoskeletal:        General: Normal range of motion.     Cervical back: Neck supple.     Comments: Extensive atrophy of intrinsic hand muscles.  Patient has about 2+ pitting edema bilateral lower extremities.  No appearance of active wounds or cellulitis.  Skin:    General: Skin is warm and dry.  Neurological:     Comments: Patient is alert.  His speech is very clear.  He is very talkative.  Subjects can deviate into stories not associated with current situation.  No focal motor deficits.  Patient can perform finger-nose exam bilaterally with slight tremor.  Patient was not gait tested.  Interacting he is focused and attentive.  He is however also picking at his monitor and exhibiting some behaviors of picking and fidgeting.  Psychiatric:        Mood and Affect: Mood normal.     ED Results / Procedures / Treatments   Labs (all labs ordered are listed, but only abnormal results are displayed) Labs Reviewed  COMPREHENSIVE METABOLIC PANEL - Abnormal; Notable for the following components:      Result Value   Sodium 134 (*)    Chloride 95 (*)    Glucose, Bld 302 (*)    Calcium 8.8 (*)     Albumin 3.3 (*)    All other components within normal limits  CBC - Abnormal; Notable for the following components:   Hemoglobin 12.6 (*)    HCT 38.7 (*)    RDW 15.7 (*)    All other components within normal limits  URINALYSIS, ROUTINE W REFLEX MICROSCOPIC - Abnormal; Notable for the following components:   APPearance HAZY (*)    Glucose, UA >=500 (*)    Hgb urine dipstick LARGE (*)    Protein, ur >300 (*)    Leukocytes,Ua MODERATE (*)    All other components within normal limits  ACETAMINOPHEN LEVEL - Abnormal; Notable for the following components:   Acetaminophen (Tylenol), Serum <10 (*)    All other components within normal limits  SALICYLATE LEVEL - Abnormal; Notable for the following components:   Salicylate Lvl <9.2 (*)    All other components within normal limits  URINALYSIS, MICROSCOPIC (REFLEX) - Abnormal; Notable for the following components:   Bacteria, UA RARE (*)    All other components within normal limits  CBG MONITORING, ED - Abnormal; Notable for the following components:   Glucose-Capillary 307 (*)    All other components within normal limits  I-STAT VENOUS BLOOD GAS, ED - Abnormal; Notable for the following components:   Bicarbonate 31.3 (*)    TCO2 33 (*)    Acid-Base Excess 5.0 (*)    HCT 38.0 (*)    Hemoglobin 12.9 (*)    All other components within normal limits  TROPONIN I (HIGH SENSITIVITY) - Abnormal; Notable for the following components:   Troponin I (High Sensitivity) 20 (*)    All other components within normal limits  ETHANOL  AMMONIA  MAGNESIUM  PHOSPHORUS  TSH  VITAMIN B12  FOLATE  RPR  CBG MONITORING, ED    EKG EKG Interpretation  Date/Time:  Wednesday January 03 2022 13:07:24 EST Ventricular Rate:  46 PR Interval:    QRS Duration: 90 QT Interval:  552 QTC Calculation: 483 R Axis:   52 Text Interpretation: Atrial fibrillation with slow ventricular response Anterior infarct , age undetermined Abnormal ECG When compared with ECG  of 17-Jul-2021 21:54, similar but slower rate. Confirmed by Charlesetta Shanks [phone]) on 01/03/2022 6:01:20 PM  Radiology CT Head Wo Contrast  Result Date: 01/03/2022 CLINICAL DATA:  Hallucinations EXAM: CT HEAD WITHOUT CONTRAST TECHNIQUE: Contiguous axial images were obtained from the base of the skull through the vertex without intravenous contrast. RADIATION DOSE REDUCTION: This exam was performed according to the departmental dose-optimization program which includes automated exposure control, adjustment of the mA and/or kV according to patient size and/or use of iterative reconstruction technique. COMPARISON:  05/16/2020 FINDINGS: Brain: No acute intracranial findings are seen. There are no signs of bleeding within the cranium. Cortical sulci are prominent. There is decreased density in periventricular white matter. Vascular: Scattered arterial calcifications are seen. Skull: Unremarkable. Sinuses/Orbits: Unremarkable. Other: None. IMPRESSION: No acute intracranial findings are seen in noncontrast CT brain. Atrophy. Small-vessel disease. Electronically Signed   By: Elmer Picker M.D.   On: 01/03/2022 12:54    Procedures Procedures    Medications Ordered in ED Medications - No data to display  ED Course/ Medical Decision Making/ A&P                           Medical Decision Making Amount and/or Complexity of Data Reviewed Labs: ordered.  Risk Decision regarding hospitalization.  Patient presenting with sharp hallucinations.  Patient's son describes the this as being phenomenon over the past several days.  At baseline hallucinating is not part of the patient's daily mental status.  After extensively interviewing the patient and his son, it appears that the patient has cognitive impairment.  They do not recognize a diagnosis of dementia.  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  He has not been able to drive a car for over a year.  His mother who  is in her 37s drives him to appointments and his son helps care for him in the home.  Patient has exhibited behaviors at home like wearing the same clothes for prolonged periods of time and being disorganized with managing his medications.  I have asked pensively reviewed medications and at this time no apparent offending medications either started or discontinued.  No obvious focus of infection to exacerbate mild cognitive impairment.  No drug or alcohol history that was suggest either intoxication or withdrawal.  Consideration is given to multi-infarct dementia.  Patient has atrial fibrillation that is been paroxysmal by review of EMR.  Patient's compliance with Eliquis appears spotty.  Consideration for MRI for possible occult stroke versus dementia evaluation.  Patient has been significantly bradycardic in the emergency department initially he was tachycardic in the 100s and then transition to a bradycardic rate in the 30s to 40s slow atrial fibrillation.  No medications appear to be an issue.  At this time will need to observe on the monitor at least overnight to determine if patient is clinically stable with very low heart rates.  His mental status and perfusion are not reflecting instability with heart rates in the 40s.  Will plan for admission for altered mental status in the form of hallucinations and bradycardia with slow atrial fibrillation. Consult: Dr. Posey Pronto for admission.  Consult: Dr. Domenic Polite cardiology.  Continue to monitor for rate.  Determine if patient is symptomatic with low rates in consider consultation with cardiology in the morning.        Final Clinical Impression(s) / ED Diagnoses Final diagnoses:  Bradycardia  Hallucinations  Atrial fibrillation, unspecified type [facility])    Rx / DC Orders ED Discharge Orders     None         Charlesetta Shanks, MD 01/03/22 2057

## 2022-01-04 ENCOUNTER — Other Ambulatory Visit: Payer: Self-pay | Admitting: Physician Assistant

## 2022-01-04 ENCOUNTER — Observation Stay (HOSPITAL_COMMUNITY): Payer: PPO

## 2022-01-04 ENCOUNTER — Telehealth: Payer: Self-pay

## 2022-01-04 ENCOUNTER — Inpatient Hospital Stay (HOSPITAL_BASED_OUTPATIENT_CLINIC_OR_DEPARTMENT_OTHER)
Admit: 2022-01-04 | Discharge: 2022-01-04 | Disposition: A | Discharge status: Home / Self Care | Payer: PPO | Attending: Physician Assistant | Admitting: Physician Assistant

## 2022-01-04 ENCOUNTER — Encounter (HOSPITAL_COMMUNITY): Payer: Self-pay | Admitting: Internal Medicine

## 2022-01-04 ENCOUNTER — Telehealth: Payer: Self-pay | Admitting: Cardiovascular Disease

## 2022-01-04 ENCOUNTER — Observation Stay (HOSPITAL_BASED_OUTPATIENT_CLINIC_OR_DEPARTMENT_OTHER): Payer: PPO

## 2022-01-04 DIAGNOSIS — R001 Bradycardia, unspecified: Secondary | ICD-10-CM | POA: Diagnosis not present

## 2022-01-04 DIAGNOSIS — R55 Syncope and collapse: Secondary | ICD-10-CM

## 2022-01-04 DIAGNOSIS — I5033 Acute on chronic diastolic (congestive) heart failure: Secondary | ICD-10-CM | POA: Diagnosis present

## 2022-01-04 DIAGNOSIS — R4182 Altered mental status, unspecified: Secondary | ICD-10-CM | POA: Diagnosis not present

## 2022-01-04 DIAGNOSIS — R9431 Abnormal electrocardiogram [ECG] [EKG]: Secondary | ICD-10-CM | POA: Diagnosis not present

## 2022-01-04 DIAGNOSIS — I5032 Chronic diastolic (congestive) heart failure: Secondary | ICD-10-CM | POA: Diagnosis present

## 2022-01-04 DIAGNOSIS — I739 Peripheral vascular disease, unspecified: Secondary | ICD-10-CM | POA: Diagnosis not present

## 2022-01-04 DIAGNOSIS — I4819 Other persistent atrial fibrillation: Secondary | ICD-10-CM | POA: Diagnosis not present

## 2022-01-04 DIAGNOSIS — N3289 Other specified disorders of bladder: Secondary | ICD-10-CM | POA: Diagnosis not present

## 2022-01-04 DIAGNOSIS — F29 Unspecified psychosis not due to a substance or known physiological condition: Secondary | ICD-10-CM | POA: Diagnosis not present

## 2022-01-04 DIAGNOSIS — I4891 Unspecified atrial fibrillation: Secondary | ICD-10-CM | POA: Diagnosis not present

## 2022-01-04 DIAGNOSIS — R338 Other retention of urine: Secondary | ICD-10-CM | POA: Diagnosis present

## 2022-01-04 DIAGNOSIS — I614 Nontraumatic intracerebral hemorrhage in cerebellum: Secondary | ICD-10-CM | POA: Diagnosis not present

## 2022-01-04 HISTORY — DX: Other retention of urine: R33.8

## 2022-01-04 LAB — ECHOCARDIOGRAM COMPLETE
Calc EF: 66.9 %
Height: 74 in
MV M vel: 2.67 m/s
MV Peak grad: 28.5 mmHg
S' Lateral: 3 cm
Single Plane A2C EF: 67 %
Single Plane A4C EF: 66.6 %
Weight: 2758.4 oz

## 2022-01-04 LAB — GLUCOSE, CAPILLARY
Glucose-Capillary: 114 mg/dL — ABNORMAL HIGH (ref 70–99)
Glucose-Capillary: 120 mg/dL — ABNORMAL HIGH (ref 70–99)
Glucose-Capillary: 121 mg/dL — ABNORMAL HIGH (ref 70–99)
Glucose-Capillary: 124 mg/dL — ABNORMAL HIGH (ref 70–99)
Glucose-Capillary: 127 mg/dL — ABNORMAL HIGH (ref 70–99)
Glucose-Capillary: 133 mg/dL — ABNORMAL HIGH (ref 70–99)

## 2022-01-04 LAB — COMPREHENSIVE METABOLIC PANEL
ALT: 9 U/L (ref 0–44)
AST: 17 U/L (ref 15–41)
Albumin: 2.4 g/dL — ABNORMAL LOW (ref 3.5–5.0)
Alkaline Phosphatase: 79 U/L (ref 38–126)
Anion gap: 6 (ref 5–15)
BUN: 12 mg/dL (ref 8–23)
CO2: 30 mmol/L (ref 22–32)
Calcium: 7.9 mg/dL — ABNORMAL LOW (ref 8.9–10.3)
Chloride: 95 mmol/L — ABNORMAL LOW (ref 98–111)
Creatinine, Ser: 0.65 mg/dL (ref 0.61–1.24)
GFR, Estimated: >60 mL/min (ref >60)
Glucose, Bld: 119 mg/dL — ABNORMAL HIGH (ref 70–99)
Potassium: 3 mmol/L — ABNORMAL LOW (ref 3.5–5.1)
Sodium: 131 mmol/L — ABNORMAL LOW (ref 135–145)
Total Bilirubin: 0.8 mg/dL (ref 0.3–1.2)
Total Protein: 5.3 g/dL — ABNORMAL LOW (ref 6.5–8.1)

## 2022-01-04 LAB — CBC
HCT: 35.7 % — ABNORMAL LOW (ref 39.0–52.0)
Hemoglobin: 11.6 g/dL — ABNORMAL LOW (ref 13.0–17.0)
MCH: 28.7 pg (ref 26.0–34.0)
MCHC: 32.5 g/dL (ref 30.0–36.0)
MCV: 88.4 fL (ref 80.0–100.0)
Platelets: 171 10*3/uL (ref 150–400)
RBC: 4.04 MIL/uL — ABNORMAL LOW (ref 4.22–5.81)
RDW: 15.4 % (ref 11.5–15.5)
WBC: 5.1 10*3/uL (ref 4.0–10.5)
nRBC: 0 % (ref 0.0–0.2)

## 2022-01-04 LAB — PREALBUMIN: Prealbumin: 11 mg/dL — ABNORMAL LOW (ref 18–38)

## 2022-01-04 LAB — RETICULOCYTES
Immature Retic Fract: 6.6 % (ref 2.3–15.9)
RBC.: 4.02 MIL/uL — ABNORMAL LOW (ref 4.22–5.81)
Retic Count, Absolute: 56.3 10*3/uL (ref 19.0–186.0)
Retic Ct Pct: 1.4 % (ref 0.4–3.1)

## 2022-01-04 LAB — URINALYSIS, COMPLETE (UACMP) WITH MICROSCOPIC
Bilirubin Urine: NEGATIVE
Glucose, UA: 50 mg/dL — AB
Ketones, ur: NEGATIVE mg/dL
Nitrite: NEGATIVE
Protein, ur: >=300 mg/dL — AB
Specific Gravity, Urine: 1.01 (ref 1.005–1.030)
WBC, UA: >50 WBC/hpf — ABNORMAL HIGH (ref 0–5)
pH: 7 (ref 5.0–8.0)

## 2022-01-04 LAB — C-REACTIVE PROTEIN: CRP: 0.5 mg/dL (ref <1.0)

## 2022-01-04 LAB — IRON AND TIBC
Iron: 37 ug/dL — ABNORMAL LOW (ref 45–182)
Saturation Ratios: 14 % — ABNORMAL LOW (ref 17.9–39.5)
TIBC: 270 ug/dL (ref 250–450)
UIBC: 233 ug/dL

## 2022-01-04 LAB — FERRITIN: Ferritin: 147 ng/mL (ref 24–336)

## 2022-01-04 LAB — RPR: RPR Ser Ql: NONREACTIVE

## 2022-01-04 LAB — RAPID URINE DRUG SCREEN, HOSP PERFORMED
Amphetamines: NOT DETECTED
Barbiturates: NOT DETECTED
Benzodiazepines: NOT DETECTED
Cocaine: NOT DETECTED
Opiates: NOT DETECTED
Tetrahydrocannabinol: NOT DETECTED

## 2022-01-04 LAB — CK: Total CK: 104 U/L (ref 49–397)

## 2022-01-04 LAB — PHOSPHORUS: Phosphorus: 3.5 mg/dL (ref 2.5–4.6)

## 2022-01-04 LAB — MAGNESIUM: Magnesium: 1.8 mg/dL (ref 1.7–2.4)

## 2022-01-04 LAB — HIV ANTIBODY (ROUTINE TESTING W REFLEX): HIV Screen 4th Generation wRfx: NONREACTIVE

## 2022-01-04 LAB — SEDIMENTATION RATE: Sed Rate: 17 mm/hr — ABNORMAL HIGH (ref 0–16)

## 2022-01-04 MED ORDER — THIAMINE MONONITRATE 100 MG PO TABS
100.0000 mg | ORAL_TABLET | Freq: Every day | ORAL | Status: DC
  Administered 2022-01-04 – 2022-01-06 (×3): 100 mg via ORAL
  Filled 2022-01-04 (×3): qty 1

## 2022-01-04 MED ORDER — POTASSIUM CHLORIDE CRYS ER 20 MEQ PO TBCR
40.0000 meq | EXTENDED_RELEASE_TABLET | Freq: Once | ORAL | Status: AC
  Administered 2022-01-04: 40 meq via ORAL
  Filled 2022-01-04: qty 2

## 2022-01-04 MED ORDER — FUROSEMIDE 10 MG/ML IJ SOLN
40.0000 mg | Freq: Two times a day (BID) | INTRAMUSCULAR | Status: DC
  Administered 2022-01-04 – 2022-01-06 (×4): 40 mg via INTRAVENOUS
  Filled 2022-01-04 (×4): qty 4

## 2022-01-04 MED ORDER — MAGNESIUM SULFATE 2 GM/50ML IV SOLN
2.0000 g | Freq: Once | INTRAVENOUS | Status: AC
  Administered 2022-01-04: 2 g via INTRAVENOUS
  Filled 2022-01-04: qty 50

## 2022-01-04 MED ORDER — SODIUM CHLORIDE 0.9 % IV SOLN
1.0000 g | INTRAVENOUS | Status: DC
  Administered 2022-01-04 – 2022-01-05 (×2): 1 g via INTRAVENOUS
  Filled 2022-01-04 (×2): qty 10

## 2022-01-04 MED ORDER — ALPRAZOLAM 0.5 MG PO TABS
0.5000 mg | ORAL_TABLET | Freq: Once | ORAL | Status: AC
  Administered 2022-01-04: 0.5 mg via ORAL
  Filled 2022-01-04: qty 1

## 2022-01-04 MED ORDER — AMLODIPINE BESYLATE 10 MG PO TABS
10.0000 mg | ORAL_TABLET | Freq: Every day | ORAL | Status: DC
  Administered 2022-01-04 – 2022-01-05 (×2): 10 mg via ORAL
  Filled 2022-01-04 (×2): qty 1

## 2022-01-04 MED ORDER — TAMSULOSIN HCL 0.4 MG PO CAPS
0.4000 mg | ORAL_CAPSULE | Freq: Every day | ORAL | Status: DC
  Administered 2022-01-04 – 2022-01-05 (×2): 0.4 mg via ORAL
  Filled 2022-01-04 (×2): qty 1

## 2022-01-04 MED ORDER — GADOBUTROL 1 MMOL/ML IV SOLN
8.0000 mL | Freq: Once | INTRAVENOUS | Status: AC | PRN
  Administered 2022-01-04: 8 mL via INTRAVENOUS

## 2022-01-04 MED ORDER — ORAL CARE MOUTH RINSE: 15.0000 mL | OROMUCOSAL | Status: DC | PRN

## 2022-01-04 NOTE — Progress Notes (Signed)
  Echocardiogram 2D Echocardiogram has been performed.  [name] [name] 01/04/2022, 10:36 AM

## 2022-01-04 NOTE — Procedures (Signed)
Patient Name: [name] [name]  MRN: [id number]  Epilepsy Attending: Lora Havens  Referring Physician/Provider: Toy Baker, MD  Date: 01/04/2022 Duration: 25.59 mins  Patient history: 67 year old male with altered mental status.  EEG to evaluate for seizure.  Level of alertness: Awake  AEDs during EEG study: Xanax  Technical aspects: This EEG study was done with scalp electrodes positioned according to the 10-20 International system of electrode placement. Electrical activity was reviewed with band pass filter of 1-'70Hz'$ , sensitivity of 7 uV/mm, display speed of 62m/sec with a '60Hz'$  notched filter applied as appropriate. EEG data were recorded continuously and digitally stored.  Video monitoring was available and reviewed as appropriate.  Description: The posterior dominant rhythm consists of 9-10 Hz activity of moderate voltage (25-35 uV) seen predominantly in posterior head regions, symmetric and reactive to eye opening and eye closing. Sleep was characterized by vertex waves, sleep spindles (12 to 14 Hz), maximal frontocentral region. There is an excessive amount of 15 to 18 Hz beta activity distributed symmetrically and diffusely.  Hyperventilation and photic stimulation were not performed.     ABNORMALITY - Excessive beta, generalized  IMPRESSION: This study is within normal limits. The excessive beta activity seen in the background is most likely due to the effect of benzodiazepine and is a benign EEG pattern. No seizures or epileptiform discharges were seen throughout the recording.  [name]

## 2022-01-04 NOTE — Progress Notes (Signed)
PROGRESS NOTE    [name] [name]  WUJ:[id number] DOB: [birth year] DOA: 01/03/2022 PCP: [name] Lathe, PA-C    Brief Narrative:  67 y.o. male with medical history significant of A-fib on Eliquis, CAD status post CABG, PAD, diastolic CHF, type 2 diabetes on insulin pump hypertension hyperlipidemia.   Admitted for   Bradycardia, Hallucinations     Assessment and Plan: Atrial fibrillation with slow ventricular response (HCC) TSH WNL  -pt is asymptomatic not on BB -echo done -cards was consulted -HR appropriately goes up with exertion-- not on any rate lowering meds   Hallucinations MRI/EEG unrevealing Patient would probably benefit from long-term follow-up with neurology -B1 folate and RPR pending -per family hallucinations started suddenly on Monday- ? Infection vs continued issues with eyes (follows with Dr. Coralyn [name] and was to have surgery-- will reach out to him)  UTI -culture pending -start abx  Uncontrolled type 2 diabetes mellitus with hyperglycemia ([place]) -SSI -hold pump for now   Coronary artery disease Chronic stable continue  -Lipitor   Hypertension Resume Cozaar 25 mg a day  -IV lasix -adjust up as able   PAF (paroxysmal atrial fibrillation) (HCC) Continue Eliquis at home dose of 5 mg p.o. twice daily -not on any rate lowering meds     Hypokalemia -replete   Acute urinary retention Could be contributoring to delirium Measure post void residual  Start Flomax 0.4 mg po q day Renal US: Large bilateral pleural effusions are noted increased from prior plain film.     Acute on  Chronic diastolic CHF (congestive heart failure) (HCC) with large pleural effusions -IV lasix and monitor -may need thoracentesis   DVT prophylaxis:  apixaban (ELIQUIS) tablet 5 mg    Code Status: Full Code Family Communication: called son  Disposition Plan:  Level of care: Progressive Status is: Observation The patient will require care spanning > 2 midnights and  should be moved to inpatient    Consultants:  cards   Subjective: Difficult historian- rambles  Objective: Vitals:   01/04/22 0406 01/04/22 0734 01/04/22 0829 01/04/22 1148  BP: 126/79 (!) 189/94 (!) 185/93 (!) 193/93  Pulse: (!) 45 (!) 46  (!) 45  Resp: '18 18  18  '$ Temp: (!) 97.3 F (36.3 C) (!) 97.5 F (36.4 C)  97.7 F (36.5 C)  TempSrc: Axillary Oral  Oral  SpO2: 95% 97%  99%  Weight:      Height:        Intake/Output Summary (Last 24 hours) at 01/04/2022 1344 Last data filed at 01/04/2022 0843 Gross per 24 hour  Intake 386.67 ml  Output 600 ml  Net -213.33 ml   Filed Weights   01/03/22 1221 01/03/22 2200 01/04/22 0002  Weight: 74.8 kg 78.1 kg 78.2 kg    Examination:   General: Appearance:    Well developed, well nourished male in no acute distress     Lungs:      respirations unlabored  Heart:    Bradycardic.    MS:   All extremities are intact.    Neurologic:   Awake, alert, rambles       Data Reviewed: I have personally reviewed following labs and imaging studies  CBC: Recent Labs  Lab 01/03/22 1235 01/03/22 1910 01/04/22 0400  WBC 5.0  --  5.1  HGB 12.6* 12.9* 11.6*  HCT 38.7* 38.0* 35.7*  MCV 88.6  --  88.4  PLT 176  --  956   Basic Metabolic Panel: Recent Labs  Lab 01/03/22  1235 01/03/22 1910 01/03/22 2006 01/11/22 0400  NA 134* 137  --  131*  K 3.5 3.5  --  3.0*  CL 95*  --   --  95*  CO2 32  --   --  30  GLUCOSE 302*  --   --  119*  BUN 14  --   --  12  CREATININE 0.76  --   --  0.65  CALCIUM 8.8*  --   --  7.9*  MG  --   --  1.7 1.8  PHOS  --   --  3.6 3.5   GFR: Estimated Creatinine Clearance: 97.8 mL/min (by C-G formula based on SCr of 0.65 mg/dL). Liver Function Tests: Recent Labs  Lab 01/03/22 1235 Jan 11, 2022 0400  AST 16 17  ALT 7 9  ALKPHOS 93 79  BILITOT 0.7 0.8  PROT 6.6 5.3*  ALBUMIN 3.3* 2.4*   No results for input(s): "LIPASE", "AMYLASE" in the last 168 hours. Recent Labs  Lab 01/03/22 1907   AMMONIA 34   Coagulation Profile: No results for input(s): "INR", "PROTIME" in the last 168 hours. Cardiac Enzymes: Recent Labs  Lab January 11, 2022 0400  CKTOTAL 104   BNP (last 3 results) No results for input(s): "PROBNP" in the last 8760 hours. HbA1C: No results for input(s): "HGBA1C" in the last 72 hours. CBG: Recent Labs  Lab 01/03/22 2209 01/11/2022 0009 01-11-2022 0414 01-11-22 0737 2022/01/11 1147  GLUCAP 121* 124* 120* 127* 114*   Lipid Profile: No results for input(s): "CHOL", "HDL", "LDLCALC", "TRIG", "CHOLHDL", "LDLDIRECT" in the last 72 hours. Thyroid Function Tests: Recent Labs    01/03/22 2007  TSH 4.051   Anemia Panel: Recent Labs    01/03/22 2006 01/11/22 0400  VITAMINB12 550  --   FOLATE 19.4  --   FERRITIN  --  147  TIBC  --  270  IRON  --  37*  RETICCTPCT  --  1.4   Sepsis Labs: No results for input(s): "PROCALCITON", "LATICACIDVEN" in the last 168 hours.  No results found for this or any previous visit (from the past 240 hour(s)).       Radiology Studies: EEG adult  Result Date: 01-11-22 [name] Havens, MD     11-Jan-2022 12:47 PM Patient Name: [name] [name] MRN: [id number] Epilepsy Attending: Lora [name] Referring Physician/Provider: Toy Baker, MD Date: 01/11/22 Duration: 25.59 mins Patient history: 67 year old male with altered mental status.  EEG to evaluate for seizure. Level of alertness: Awake AEDs during EEG study: Xanax Technical aspects: This EEG study was done with scalp electrodes positioned according to the 10-20 International system of electrode placement. Electrical activity was reviewed with band pass filter of 1-'70Hz'$ , sensitivity of 7 uV/mm, display speed of 17m/sec with a '60Hz'$  notched filter applied as appropriate. EEG data were recorded continuously and digitally stored.  Video monitoring was available and reviewed as appropriate. Description: The posterior dominant rhythm consists of 9-10 Hz activity of  moderate voltage (25-35 uV) seen predominantly in posterior head regions, symmetric and reactive to eye opening and eye closing. Sleep was characterized by vertex waves, sleep spindles (12 to 14 Hz), maximal frontocentral region. There is an excessive amount of 15 to 18 Hz beta activity distributed symmetrically and diffusely.  Hyperventilation and photic stimulation were not performed.   ABNORMALITY - Excessive beta, generalized IMPRESSION: This study is within normal limits. The excessive beta activity seen in the background is most likely due to the effect of benzodiazepine and is  a benign EEG pattern. No seizures or epileptiform discharges were seen throughout the recording. [name] [name]   ECHOCARDIOGRAM COMPLETE  Result Date: 01/04/2022    ECHOCARDIOGRAM REPORT   Patient Name:   TERRIAN SENTELL Date of Exam: 01/04/2022 Medical Rec #:  [id number]            Height:       74.0 in Accession #:    [id number]           Weight:       172.4 lb Date of Birth:  11-22-1955             BSA:          2.041 m Patient Age:    52 years             BP:           185/93 mmHg Patient Gender: M                    HR:           50 bpm. Exam Location:  Inpatient Procedure: 2D Echo, Cardiac Doppler and Color Doppler Indications:    Abnormal ECG  History:        Patient has prior history of Echocardiogram examinations, most                 recent 02/15/2021. CHF, CAD, Prior CABG, PAD, Arrythmias:Atrial                 Fibrillation; Risk Factors:Diabetes, Hypertension and                 Dyslipidemia.  Sonographer:    Eartha Inch Referring Phys: [id number] ANASTASSIA DOUTOVA  Sonographer Comments: Technically challenging study due to limited acoustic windows. Image acquisition challenging due to patient body habitus and Image acquisition challenging due to respiratory motion. IMPRESSIONS  1. Left ventricular ejection fraction, by estimation, is 60 to 65%. The left ventricle has normal function. The left ventricle has no regional  wall motion abnormalities. There is moderate asymmetric left ventricular hypertrophy of the basal-septal segment. Left ventricular diastolic parameters are indeterminate.  2. Right ventricular systolic function is normal. The right ventricular size is normal. Tricuspid regurgitation signal is inadequate for assessing PA pressure.  3. The mitral valve is normal in structure. Trivial mitral valve regurgitation. No evidence of mitral stenosis.  4. The aortic valve is tricuspid. Aortic valve regurgitation is not visualized. Aortic valve sclerosis/calcification is present, without any evidence of aortic stenosis.  5. Aortic dilatation noted. There is mild dilatation of the ascending aorta, measuring 38 mm.  6. The inferior vena cava is dilated in size with >50% respiratory variability, suggesting right atrial pressure of 8 mmHg. FINDINGS  Left Ventricle: Left ventricular ejection fraction, by estimation, is 60 to 65%. The left ventricle has normal function. The left ventricle has no regional wall motion abnormalities. The left ventricular internal cavity size was normal in size. There is  moderate asymmetric left ventricular hypertrophy of the basal-septal segment. Left ventricular diastolic parameters are indeterminate. Right Ventricle: The right ventricular size is normal. Right vetricular wall thickness was not well visualized. Right ventricular systolic function is normal. Tricuspid regurgitation signal is inadequate for assessing PA pressure. Left Atrium: Left atrial size was normal in size. Right Atrium: Right atrial size was normal in size. Pericardium: There is no evidence of pericardial effusion. Mitral Valve: The mitral valve is normal in structure. Trivial mitral valve regurgitation.  No evidence of mitral valve stenosis. MV peak gradient, 3.9 mmHg. The mean mitral valve gradient is 1.0 mmHg. Tricuspid Valve: The tricuspid valve is normal in structure. Tricuspid valve regurgitation is trivial. Aortic Valve: The  aortic valve is tricuspid. Aortic valve regurgitation is not visualized. Aortic valve sclerosis/calcification is present, without any evidence of aortic stenosis. Pulmonic Valve: The pulmonic valve was not well visualized. Pulmonic valve regurgitation is trivial. Aorta: The aortic root is normal in size and structure and aortic dilatation noted. There is mild dilatation of the ascending aorta, measuring 38 mm. Venous: The inferior vena cava is dilated in size with greater than 50% respiratory variability, suggesting right atrial pressure of 8 mmHg. IAS/Shunts: No atrial level shunt detected by color flow Doppler.  LEFT VENTRICLE PLAX 2D LVIDd:         4.25 cm      Diastology LVIDs:         3.00 cm      LV e' medial:    6.18 cm/s LV PW:         1.10 cm      LV E/e' medial:  12.5 LV IVS:        1.35 cm      LV e' lateral:   8.59 cm/s LVOT diam:     2.20 cm      LV E/e' lateral: 9.0 LVOT Area:     3.80 cm  LV Volumes (MOD) LV vol d, MOD A2C: 123.0 ml LV vol d, MOD A4C: 80.3 ml LV vol s, MOD A2C: 40.6 ml LV vol s, MOD A4C: 26.8 ml LV SV MOD A2C:     82.4 ml LV SV MOD A4C:     80.3 ml LV SV MOD BP:      67.4 ml RIGHT VENTRICLE             IVC RV S prime:     10.10 cm/s  IVC diam: 2.10 cm TAPSE (M-mode): 1.0 cm LEFT ATRIUM             Index        RIGHT ATRIUM          Index LA diam:        4.10 cm 2.01 cm/m   RA Area:     9.32 cm LA Vol (A2C):   64.5 ml 31.61 ml/m  RA Volume:   15.50 ml 7.60 ml/m LA Vol (A4C):   51.6 ml 25.29 ml/m LA Biplane Vol: 60.7 ml 29.75 ml/m   AORTA Ao Root diam: 3.90 cm Ao Asc diam:  3.80 cm MITRAL VALVE MV Peak grad: 3.9 mmHg     SHUNTS MV Mean grad: 1.0 mmHg     Systemic Diam: 2.20 cm MV Vmax:      0.98 m/s MV Vmean:     50.3 cm/s MR Peak grad: 28.5 mmHg MR Vmax:      267.00 cm/s MV E velocity: 77.50 cm/s Oswaldo Milian MD Electronically signed by Oswaldo Milian MD Signature Date/Time: 01/04/2022/11:40:35 AM    Final    US RENAL  Result Date: 01/04/2022 CLINICAL DATA:   Hematuria EXAM: RENAL / URINARY TRACT ULTRASOUND COMPLETE COMPARISON:  07/18/2021, plain film from 01/03/2022 FINDINGS: Right Kidney: Renal measurements: 12.6 x 5.9 x 5.6 cm. = volume: 217.4 mL. Echogenicity within normal limits. No mass or hydronephrosis visualized. Left Kidney: Renal measurements: 13 x 7.3 x 5.1 cm. = volume: 252.3 mL. Echogenicity within normal limits. No mass or hydronephrosis visualized. Bladder:  Bladder is well distended. Some dependent debris is noted. Adjacent to the bladder is the reservoir for the patient's penile implant system. Other: Large bilateral pleural effusions are noted. IMPRESSION: Normal appearing kidneys bilaterally. Bladder is well distended with some dependent debris. Correlate with laboratory values. Large bilateral pleural effusions are noted increased from prior plain film. Electronically Signed   By: Inez Catalina M.D.   On: 01/04/2022 03:20   MR BRAIN W WO CONTRAST  Result Date: 01/04/2022 CLINICAL DATA:  Psychosis EXAM: MRI HEAD WITHOUT AND WITH CONTRAST TECHNIQUE: Multiplanar, multiecho pulse sequences of the brain and surrounding structures were obtained without and with intravenous contrast. CONTRAST:  63m GADAVIST GADOBUTROL 1 MMOL/ML IV SOLN COMPARISON:  None Available. FINDINGS: Brain: No acute infarction, hemorrhage, hydrocephalus, extra-axial collection or mass lesion. Single chronic microhemorrhage in the left cerebellum. Midline structures are normal. Mild periventricular white matter hyperintensity. 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 Mild volume loss. No abnormal contrast enhancement. Vascular: Normal flow voids Skull and upper cervical spine: Normal bone marrow signal. Sinuses/Orbits: Paranasal sinuses and mastoids are clear. Normal orbits. Other: Nasopharynx is clear. IMPRESSION: 1. No acute intracranial abnormality. 2. Chronic small vessel disease Electronically Signed   By: KUlyses JarredM.D.   On: 01/04/2022 03:19   DG  CHEST PORT 1 VIEW  Result Date: 01/03/2022 CLINICAL DATA:  Bradycardia. EXAM: PORTABLE CHEST 1 VIEW COMPARISON:  Chest radiograph 07/17/2021 FINDINGS: Prior median sternotomy. Mild cardiomegaly. Aortic atherosclerosis. There are small bilateral pleural effusions, tracking laterally on the right. Vertical lucency in the right lower lung zone is likely an overlying skin fold. No evidence of pneumothorax. Mild peribronchial thickening. IMPRESSION: 1. Mild cardiomegaly with small bilateral pleural effusions. 2. Mild peribronchial thickening, can be seen with bronchitis or pulmonary edema. Electronically Signed   By: MKeith RakeM.D.   On: 01/03/2022 23:24   CT Head Wo Contrast  Result Date: 01/03/2022 CLINICAL DATA:  Hallucinations EXAM: CT HEAD WITHOUT CONTRAST TECHNIQUE: Contiguous axial images were obtained from the base of the skull through the vertex without intravenous contrast. RADIATION DOSE REDUCTION: This exam was performed according to the departmental dose-optimization program which includes automated exposure control, adjustment of the mA and/or kV according to patient size and/or use of iterative reconstruction technique. COMPARISON:  05/16/2020 FINDINGS: Brain: No acute intracranial findings are seen. There are no signs of bleeding within the cranium. Cortical sulci are prominent. There is decreased density in periventricular white matter. Vascular: Scattered arterial calcifications are seen. Skull: Unremarkable. Sinuses/Orbits: Unremarkable. Other: None. IMPRESSION: No acute intracranial findings are seen in noncontrast CT brain. Atrophy. Small-vessel disease. Electronically Signed   By: PElmer PickerM.D.   On: 01/03/2022 12:54        Scheduled Meds:  apixaban  5 mg Oral BID   aspirin EC  81 mg Oral Daily   atorvastatin  40 mg Oral Daily   furosemide  40 mg Intravenous Q12H   insulin aspart  0-9 Units Subcutaneous Q4H   losartan  25 mg Oral Daily   mirtazapine  15 mg Oral QHS    PARoxetine  20 mg Oral Daily   potassium chloride  40 mEq Oral Once   sodium chloride flush  3 mL Intravenous Q12H   tamsulosin  0.4 mg Oral QPC supper   thiamine  100 mg Oral Daily   Continuous Infusions:  sodium chloride     magnesium sulfate bolus IVPB Stopped (01/04/22 0436)     LOS: 0 days  Time spent: 45 minutes spent on chart review, discussion with nursing staff, consultants, updating family and interview/physical exam; more than 50% of that time was spent in counseling and/or coordination of care.    Geradine Girt, DO [facility] Available via Epic secure chat 7am-7pm After these hours, please refer to coverage provider listed on [email or web address] 01/04/2022, 1:44 PM

## 2022-01-04 NOTE — Telephone Encounter (Signed)
Received call from [employer]. Patient on live monitor and had Afib with SVR with rate of 41-54 BPM. Left message on patient's phone. Patient currently admitted. Dr. [place] (DOD) showed rhythm. Rhythm sent for scanning.

## 2022-01-04 NOTE — Consult Note (Addendum)
Cardiology Consultation   Patient ID: [name] [name] MRN: [id number]; DOB: Apr 06, 1955  Admit date: 01/03/2022 Date of Consult: 01/04/2022  PCP:  Fredirick Lathe, [facility] Prov[facility] Cardiologist:  Evalina Field, MD        Patient Profile:   [name] [name] is a 67 y.o. male with a hx of CAD s/p 4v CABG (LIMA-LAD, RIMA-distal RCA, left radial-left circumflex artery, reverse SVG to D1 ) 09/30/2001 by Dr. Servando Snare, PAD, HTN, HLD, DM II on chronic insulin therapy, PAF and HFpEF who is being seen 01/04/2022 for the evaluation of bradycardia at the request of Dr. Eliseo Squires.  History of Present Illness:   [name] [name] is a 67 year old male with past medical history of CAD s/p 4v CABG (LIMA-LAD, RIMA-distal RCA, left radial-left circumflex artery, reverse SVG to D1 ) 09/30/2001 by Dr. Servando Snare, PAD, HTN, HLD, DM II on chronic insulin therapy, PAF and HFpEF.  Cardiac catheterization performed by Dr. Martinique prior to bypass surgery revealed total occlusion of LAD, diffusely diseased RCA with collateral filling to LAD, high-grade stenosis of D1, small D2 with diffuse disease and a 60% proximal left circumflex lesion.  EF was preserved.  There was also report of aortic aneurysm repair (unclear if thoracic or abdominal) in 2003, however there was no prior record to support this.  (At least that there was no mention of any thoracic aneurysm repair based on CABG surgical report from 2003)  Unfortunately, he has been lost to cardiology follow-up for many years (from time of CABG until 2023).  Prior to December 2020, he was followed by PCP at [facility].  He established with primary care provider at [facility] in January 2021.  He has underwent multiple lower extremity angiography by vascular surgery Dr. Trula Slade in August 2022 and November 2022.  When he was seen by his PCP in February 2023, he was noted to be in atrial fibrillation.  He was subsequently admitted to the hospital for  worsening dyspnea and found to have acute diastolic heart failure.  Chest x-ray at the time showed moderate left and a small right pleural effusion, pulmonary vascular congestion.  Echocardiogram obtained in February 2023 showed EF 55 to 60%, no regional wall motion abnormality, grade 1 DD, mild LVH.  He underwent IV diuresis.  Patient was also noted to have bradycardia with heart rate mostly in the 50s but has occasional drops down to the 40s.  Patient was asymptomatic and was not on any AV nodal blocking agent.  He was discharged on 40 mg daily of oral Lasix and 25 mg daily of spironolactone.  During the hospitalization, he self converted back to normal sinus rhythm.  He was placed on Eliquis.  By the time he was seen by heart failure TOC clinic for follow-up on 03/13/2021, he was back in atrial fibrillation with controlled heart rate of 60s.  His weight was stable at 171 pounds.  Patient was established with Dr. Audie Box in May 2023 at which time he was back in normal sinus rhythm.  He was doing okay at the time. EKG in July 2023 still shows NSR with 1st degree AV block.  Over the past year, patient has required more assistance to get around.  He does have balance issue and also daily episodes of dizziness especially when he moves his head.  He get around slowly with a rolling walker.  He mentions he used to have frequent falls, however after he used rolling walker, he  has been able to get more balance.  He reported a significant fall with multiple fractures in the past.  He lives in a senior apartment along with his son.  He was sent to the hospital by family member on 01/03/2021 with complaint of visual hallucination.  He saw lizards and spiders when there were not any.  On initial arrival, sodium 134, creatinine 0.76, hemoglobin 12.6.  Acetaminophen and salicylate level were negative.  Urinalysis showed greater than 500 glucose, large hemoglobin, moderate leukocytosis, greater than 300 protein and rare bacteria.   Urine culture is currently pending.  CT of the head showed no acute intracranial finding, chronic atrophy and small vessel disease.  MRI of the brain was also negative for acute finding.  TSH normal.  EKG showed atrial fibrillation with heart rate of 46 bpm.  Albumin was low at 2.4.  Renal ultrasound showed normal-appearing kidneys bilaterally, bladder was distended, large bilateral pleural effusion. CXR showed mild bilateral pleural effusion. During the hospitalization, patient was in persistent atrial fibrillation, heart rate has been in the low 40s overnight, during the day heart rate has been in the 50s.  Cardiology service consulted for bradycardia.  Talking with the patient, he complains of almost daily episodes of dizziness.  He denies any dizziness while he is sitting still, he says his dizziness typically occur when he suddenly raise his head to look up.  He did recall an episode of questionable syncope 2 weeks ago, he got up in the middle of the night to go to the bathroom and subsequently fell down to the floor.  He does not remember how long he passed out.  In fact he says he remembered his body falling down and that clearly hit the ground.   Past Medical History:  Diagnosis Date   Anxiety    Arthritis    Cataract    CHF (congestive heart failure) ([place])    Constipation    pt states goes EOD- hard stools    Coronary artery disease    Depression    Diabetes mellitus without complication (HCC)    Diabetic retinopathy ([place])    DKA (diabetic ketoacidoses)    Hyperlipidemia    Hypertension    off meds   Hypertensive retinopathy    Postherpetic neuralgia 08/11/2021   Speculative diagnosis   Psoriasis    Tubular adenoma of colon 03/2019    Past Surgical History:  Procedure Laterality Date   ABDOMINAL AORTOGRAM W/LOWER EXTREMITY N/A 08/30/2020   Procedure: ABDOMINAL AORTOGRAM W/LOWER EXTREMITY;  Surgeon: Serafina Mitchell, MD;  Location: [facility];  Service: Cardiovascular;   Laterality: N/A;   ABDOMINAL AORTOGRAM W/LOWER EXTREMITY N/A 11/15/2020   Procedure: ABDOMINAL AORTOGRAM W/LOWER EXTREMITY;  Surgeon: Serafina Mitchell, MD;  Location: [facility];  Service: Cardiovascular;  Laterality: N/A;   ANKLE SURGERY     right   CARDIAC CATHETERIZATION  09/25/2001   COLONOSCOPY     ~10 yr ago- normal  per pt    CORONARY ARTERY BYPASS GRAFT  09/30/2001   CABG x 4   IR THORACENTESIS ASP PLEURAL SPACE W/IMG GUIDE  05/19/2020   LAPAROSCOPIC INGUINAL HERNIA REPAIR Bilateral 02/09/2010   MASS EXCISION Left 03/12/2013   Procedure: LEFT INDEX EXCISION MASS AND DEBRIDMENT DISTAL INTERPHALANGEAL JOINT;  Surgeon: Tennis Must, MD;  Location: [facility];  Service: Orthopedics;  Laterality: Left;   PERIPHERAL VASCULAR BALLOON ANGIOPLASTY Left 08/30/2020   Procedure: PERIPHERAL VASCULAR BALLOON ANGIOPLASTY;  Surgeon: Trula Slade,  Butch Penny, MD;  Location: [facility];  Service: Cardiovascular;  Laterality: Left;  Peroneal     Home Medications:  Prior to Admission medications   Medication Sig Start Date End Date Taking? Authorizing Provider  acetaminophen (TYLENOL) 500 MG tablet Take 500 mg by mouth as needed for mild pain or moderate pain.   Yes [provider]  apixaban (ELIQUIS) 5 MG TABS tablet Take 1 tablet (5 mg total) by mouth 2 (two) times daily. PLEASE SCHEDULE APPT WITH ALYSSA FOR FURTHER REFILLS [phone] Patient taking differently: Take 5 mg by mouth 2 (two) times daily. 07/05/21  Yes [name], [name], PA-C  aspirin EC 81 MG EC tablet Take 1 tablet (81 mg total) by mouth daily. Patient taking differently: Take 81 mg by mouth in the morning. 02/18/21  Yes Domenic Polite, MD  atorvastatin (LIPITOR) 40 MG tablet TAKE 1 TABLET BY MOUTH EVERY DAY Patient taking differently: Take 40 mg by mouth in the morning. 09/26/21  Yes [name], [name], PA-C  diclofenac Sodium (VOLTAREN) 1 % GEL Apply 1 Application topically as needed (pain).   Yes  [provider]  furosemide (LASIX) 40 MG tablet TAKE 1 TABLET BY MOUTH EVERY DAY 08/29/21  Yes [name], [name], PA-C  losartan (COZAAR) 25 MG tablet TAKE 1 TABLET BY MOUTH EVERY DAY Patient taking differently: Take 25 mg by mouth in the morning. 09/26/21  Yes [name], [name], PA-C  mirtazapine (REMERON) 15 MG tablet Take 1 tablet (15 mg total) by mouth at bedtime. 07/12/21  Yes [name], [name], PA-C  Multiple Vitamins-Minerals (MULTIVITAMIN WITH MINERALS) tablet Take 1 tablet by mouth daily.   Yes [provider]  NOVOLOG 100 UNIT/ML injection INJECT 50 UNITS INTO THE SKIN SEE ADMIN INSTRUCTIONS. PT LOADS PUMP WITH 50 UNITS EVERY 3 DAYS Patient taking differently: Inject 110 Units into the skin See admin instructions. Inject 110 units into pod every three days 01/01/22  Yes Shamleffer, Melanie Crazier, MD  PARoxetine (PAXIL) 20 MG tablet TAKE 1 TABLET BY MOUTH EVERY DAY Patient taking differently: Take 20 mg by mouth in the morning. 10/02/21  Yes [name], [name], PA-C  spironolactone (ALDACTONE) 25 MG tablet TAKE 1 TABLET BY MOUTH EVERY DAY Patient taking differently: Take 25 mg by mouth in the morning. 10/02/21  Yes [name], [name], PA-C  BD PEN NEEDLE NANO 2ND GEN 32G X 4 MM MISC USE FOR INSULIN 10/05/20   Janith Lima, MD  Continuous Blood Gluc Sensor (DEXCOM G6 SENSOR) MISC 1 Device by Does not apply route See admin instructions. Change every 10 days 08/18/21   Shamleffer, Melanie Crazier, MD  Continuous Blood Gluc Transmit (DEXCOM G6 TRANSMITTER) MISC Use as instructed to check blood sugar 08/18/21   Shamleffer, Melanie Crazier, MD  Incontinence Supply Disposable (DISPOSABLE BRIEF LARGE) MISC 1 each by Does not apply route as needed. 02/24/21   [name], [name], PA-C  insulin aspart (NOVOLOG FLEXPEN) 100 UNIT/ML FlexPen Inject 4 Units into the skin 2 (two) times daily. When pump is not attached. Patient not taking: Reported on 01/04/2022    [provider]   Insulin Disposable Pump (OMNIPOD 5 G6 INTRO, GEN 5,) KIT 1 Device by Does not apply route every 3 (three) days. 11/09/20   Renato Shin, MD  lidocaine (XYLOCAINE) 5 % ointment Apply 1 Application topically as needed. Patient not taking: Reported on 01/04/2022 08/11/21   Loralee Pacas, MD    Inpatient Medications: Scheduled Meds:  apixaban  5  mg Oral BID   aspirin EC  81 mg Oral Daily   atorvastatin  40 mg Oral Daily   insulin aspart  0-9 Units Subcutaneous Q4H   losartan  25 mg Oral Daily   mirtazapine  15 mg Oral QHS   PARoxetine  20 mg Oral Daily   sodium chloride flush  3 mL Intravenous Q12H   spironolactone  25 mg Oral Daily   tamsulosin  0.4 mg Oral QPC supper   thiamine  100 mg Oral Daily   Continuous Infusions:  sodium chloride     magnesium sulfate bolus IVPB Stopped (01/04/22 0436)   PRN Meds: sodium chloride, acetaminophen **OR** acetaminophen, HYDROcodone-acetaminophen, mouth rinse, sodium chloride flush  Allergies:    Allergies  Allergen Reactions   Wool Alcohol [Lanolin] Hives and Itching    Sports coach    Social History:   Social History   Socioeconomic History   Marital status: Divorced    Spouse name: Not on file   Number of children: 4   Years of education: 12   Highest education level: 12th grade  Occupational History   Occupation: Retired  Tobacco Use   Smoking status: Former    Types: Cigars    Quit date: 2022    Years since quitting: 2.0   Smokeless tobacco: Never  Vaping Use   Vaping Use: Never used  Substance and Sexual Activity   Alcohol use: Yes    Comment: about once a year   Drug use: No   Sexual activity: Yes  Other Topics Concern   Not on file  Social History Narrative   Lives with his adult son [name].   Has 4 children, 2 sons- both live in [place], 2 daughters one lives in [place], [place] and youngest daughter attends [employer].    Fiance lives in  [place], [place]   Social Determinants of Health    Financial Resource Strain: Unknown (10/23/2019)   Overall Financial Resource Strain (CARDIA)    Difficulty of Paying Living Expenses: Patient refused  Food Insecurity: No Food Insecurity (01/03/2022)   Hunger Vital Sign    Worried About Running Out of Food in the Last Year: Never true    Ran Out of Food in the Last Year: Never true  Transportation Needs: No Transportation Needs (01/03/2022)   PRAPARE - Hydrologist (Medical): No    Lack of Transportation (Non-Medical): No  Physical Activity: Inactive (10/23/2019)   Exercise Vital Sign    Days of Exercise per Week: 0 days    Minutes of Exercise per Session: 0 min  Stress: Stress Concern Present (10/23/2019)   [place]    Feeling of Stress : Very much  Social Connections: Socially Isolated (10/23/2019)   Social Connection and Isolation Panel [NHANES]    Frequency of Communication with Friends and Family: More than three times a week    Frequency of Social Gatherings with Friends and Family: More than three times a week    Attends Religious Services: Never    Marine scientist or Organizations: No    Attends Archivist Meetings: Never    Marital Status: Divorced  Human resources officer Violence: Not At Risk (01/03/2022)   Humiliation, Afraid, Rape, and Kick questionnaire    Fear of Current or Ex-Partner: No    Emotionally Abused: No    Physically Abused: No    Sexually Abused: No    Family History:  Family History  Problem Relation Age of Onset   Lymphoma Father    Cancer Paternal Grandmother        Colon Cancer   Colon cancer Paternal Grandmother    Colon polyps Neg Hx    Esophageal cancer Neg Hx    Rectal cancer Neg Hx    Stomach cancer Neg Hx    Diabetes Mellitus I Neg Hx    Pancreatic cancer Neg Hx      ROS:  Please see the history of present illness.   All other ROS reviewed and negative.     Physical  Exam/Data:   Vitals:   01/04/22 0002 01/04/22 0406 01/04/22 0734 01/04/22 0829  BP: (!) 189/91 126/79 (!) 189/94 (!) 185/93  Pulse: 93 (!) 45 (!) 46   Resp: _0 Temp: 98.3 F (36.8 C) (!) 97.3 F (36.3 C) (!) 97.5 F (36.4 C)   TempSrc: Oral Axillary Oral   SpO2: 98% 95% 97%   Weight: 78.2 kg     Height:        Intake/Output Summary (Last 24 hours) at 01/04/2022 1108 Last data filed at 01/04/2022 0843 Gross per 24 hour  Intake 386.67 ml  Output 600 ml  Net -213.33 ml      01/04/2022   12:02 AM 01/03/2022   10:00 PM 01/03/2022   12:21 PM  Last 3 Weights  Weight (lbs) 172 lb 6.4 oz 172 lb 3.2 oz 164 lb 14.5 oz  Weight (kg) 78.2 kg 78.109 kg 74.8 kg     Body mass index is 22.13 kg/m.  General:  Well nourished, well developed, in no acute distress HEENT: normal Neck: no JVD Vascular: No carotid bruits; Distal pulses 2+ bilaterally Cardiac:  normal S1, S2; RRR; no murmur  Lungs:  clear to auscultation bilaterally, no wheezing, rhonchi or rales  Abd: soft, nontender, no hepatomegaly  Ext: no edema Musculoskeletal:  No deformities, BUE and BLE strength normal and equal Skin: warm and dry  Neuro:  CNs 2-12 intact, no focal abnormalities noted Psych:  Normal affect   EKG:  The EKG was personally reviewed and demonstrates: Atrial fibrillation with heart rate 46 Telemetry:  Telemetry was personally reviewed and demonstrates: Atrial fibrillation, heart rate in the low 50s during the day, however at night heart rate has been in the 40s.  Relevant CV Studies:  Echo 02/15/2021  1. Left ventricular ejection fraction, by estimation, is 55 to 60%. The  left ventricle has normal function. The left ventricle has no regional  wall motion abnormalities. There is mild left ventricular hypertrophy.  Left ventricular diastolic parameters  are consistent with Grade I diastolic dysfunction (impaired relaxation).   2. Right ventricular systolic function is mildly reduced. The right   ventricular size is normal.   3. Left atrial size was mildly dilated.   4. The mitral valve is normal in structure. Mild mitral valve  regurgitation. No evidence of mitral stenosis.   5. The aortic valve is tricuspid. Aortic valve regurgitation is not  visualized. Aortic valve is calcified. In the parasternal long axis,  cannot rule out some mobility with question of small vegetation. Not noted  in other views. Clinical correlation  suggested.   6. The inferior vena cava is dilated in size with >50% respiratory  variability, suggesting right atrial pressure of 8 mmHg.   7. Left pleural effusion noted.   Laboratory Data:  High Sensitivity Troponin:   Recent Labs  Lab 01/03/22 2006 01/03/22 2150  TROPONINIHS 20* 18*     Chemistry Recent Labs  Lab 01/03/22 1235 01/03/22 1910 01/03/22 2006 January 17, 2022 0400  NA 134* 137  --  131*  K 3.5 3.5  --  3.0*  CL 95*  --   --  95*  CO2 32  --   --  30  GLUCOSE 302*  --   --  119*  BUN 14  --   --  12  CREATININE 0.76  --   --  0.65  CALCIUM 8.8*  --   --  7.9*  MG  --   --  1.7 1.8  GFRNONAA >60  --   --  >60  ANIONGAP 7  --   --  6    Recent Labs  Lab 01/03/22 1235 January 17, 2022 0400  PROT 6.6 5.3*  ALBUMIN 3.3* 2.4*  AST 16 17  ALT 7 9  ALKPHOS 93 79  BILITOT 0.7 0.8   Lipids No results for input(s): "CHOL", "TRIG", "HDL", "LABVLDL", "LDLCALC", "CHOLHDL" in the last 168 hours.  Hematology Recent Labs  Lab 01/03/22 1235 01/03/22 1910 01/17/2022 0400  WBC 5.0  --  5.1  RBC 4.37  --  4.04*  4.02*  HGB 12.6* 12.9* 11.6*  HCT 38.7* 38.0* 35.7*  MCV 88.6  --  88.4  MCH 28.8  --  28.7  MCHC 32.6  --  32.5  RDW 15.7*  --  15.4  PLT 176  --  171   Thyroid  Recent Labs  Lab 01/03/22 2007  TSH 4.051    BNPNo results for input(s): "BNP", "PROBNP" in the last 168 hours.  DDimer No results for input(s): "DDIMER" in the last 168 hours.   Radiology/Studies:  US RENAL  Result Date: 01-17-22 CLINICAL DATA:  Hematuria  EXAM: RENAL / URINARY TRACT ULTRASOUND COMPLETE COMPARISON:  07/18/2021, plain film from 01/03/2022 FINDINGS: Right Kidney: Renal measurements: 12.6 x 5.9 x 5.6 cm. = volume: 217.4 mL. Echogenicity within normal limits. No mass or hydronephrosis visualized. Left Kidney: Renal measurements: 13 x 7.3 x 5.1 cm. = volume: 252.3 mL. Echogenicity within normal limits. No mass or hydronephrosis visualized. Bladder: Bladder is well distended. Some dependent debris is noted. Adjacent to the bladder is the reservoir for the patient's penile implant system. Other: Large bilateral pleural effusions are noted. IMPRESSION: Normal appearing kidneys bilaterally. Bladder is well distended with some dependent debris. Correlate with laboratory values. Large bilateral pleural effusions are noted increased from prior plain film. Electronically Signed   By: Inez Catalina M.D.   On: 2022/01/17 03:20   MR BRAIN W WO CONTRAST  Result Date: 2022/01/17 CLINICAL DATA:  Psychosis EXAM: MRI HEAD WITHOUT AND WITH CONTRAST TECHNIQUE: Multiplanar, multiecho pulse sequences of the brain and surrounding structures were obtained without and with intravenous contrast. CONTRAST:  25m GADAVIST GADOBUTROL 1 MMOL/ML IV SOLN COMPARISON:  None Available. FINDINGS: Brain: No acute infarction, hemorrhage, hydrocephalus, extra-axial collection or mass lesion. Single chronic microhemorrhage in the left cerebellum. Midline structures are normal. Mild periventricular white matter hyperintensity. 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 Mild volume loss. No abnormal contrast enhancement. Vascular: Normal flow voids Skull and upper cervical spine: Normal bone marrow signal. Sinuses/Orbits: Paranasal sinuses and mastoids are clear. Normal orbits. Other: Nasopharynx is clear. IMPRESSION: 1. No acute intracranial abnormality. 2. Chronic small vessel disease Electronically Signed   By: KUlyses JarredM.D.   On: 0January 17, 202403:19   DG CHEST PORT 1  VIEW  Result Date: 01/03/2022 CLINICAL  DATA:  Bradycardia. EXAM: PORTABLE CHEST 1 VIEW COMPARISON:  Chest radiograph 07/17/2021 FINDINGS: Prior median sternotomy. Mild cardiomegaly. Aortic atherosclerosis. There are small bilateral pleural effusions, tracking laterally on the right. Vertical lucency in the right lower lung zone is likely an overlying skin fold. No evidence of pneumothorax. Mild peribronchial thickening. IMPRESSION: 1. Mild cardiomegaly with small bilateral pleural effusions. 2. Mild peribronchial thickening, can be seen with bronchitis or pulmonary edema. Electronically Signed   By: Keith Rake M.D.   On: 01/03/2022 23:24   CT Head Wo Contrast  Result Date: 01/03/2022 CLINICAL DATA:  Hallucinations EXAM: CT HEAD WITHOUT CONTRAST TECHNIQUE: Contiguous axial images were obtained from the base of the skull through the vertex without intravenous contrast. RADIATION DOSE REDUCTION: This exam was performed according to the departmental dose-optimization program which includes automated exposure control, adjustment of the mA and/or kV according to patient size and/or use of iterative reconstruction technique. COMPARISON:  05/16/2020 FINDINGS: Brain: No acute intracranial findings are seen. There are no signs of bleeding within the cranium. Cortical sulci are prominent. There is decreased density in periventricular white matter. Vascular: Scattered arterial calcifications are seen. Skull: Unremarkable. Sinuses/Orbits: Unremarkable. Other: None. IMPRESSION: No acute intracranial findings are seen in noncontrast CT brain. Atrophy. Small-vessel disease. Electronically Signed   By: Elmer Picker M.D.   On: 01/03/2022 12:54     Assessment and Plan:   Bradycardia: Patient has a history of frequent falls, he complained of daily episode of dizzy spell, however his dizzy spell mostly occurs when he lifted his head and look up too quickly.  He also has dizzy spell when he get up too quickly as  well.  He complained of questionable episode of syncope 2 weeks ago when he got up in the middle of the night to go to the bathroom and fell to the ground.  He says he lost consciousness at the time, however he clearly remember his body going down and his body hitting the ground.  Heart rate has been in the 40s overnight, during the day he is heart rate is mostly in the low 50s with occasional dropped into the 40s.  Will discuss with MD, I do not think bradycardia is related to visual hallucination, however it is difficult to tell as the patient is a poor historian whether the bradycardia is contributing to his frequent falls. Echo and EEG are pending. Likely will need heart monitor to further evaluate as outpatient to instruction to do manual trigger on the heart monitor with dizziness to see if dizziness is truly related to bradycardic events.   Persistent atrial fibrillation: Compliance with medication is quite questionable.  There has been reports that he misses his medication during the holidays.  Visual hallucinations: He is on Paxil and Remeron at home.  Cardiac medications including losartan, spironolactone, 40 mg daily furosemide, Eliquis, aspirin and Lipitor unlikely contributing to visual hallucination.    Urinary retention: Per primary team.  Urinalysis showed rare bacteria.  CAD s/p CABG 2003: Denies any recent chest pain.  Hypertension: On losartan, spironolactone and Lasix at home.  Hyperlipidemia: On Lipitor  Uncontrolled DM2 on insulin: Greater than 300 protein in the urine.   Risk Assessment/Risk Scores:      CHA2DS2-VASc Score = 5   This indicates a 7.2% annual risk of stroke. The patient's score is based upon: CHF History: 1 HTN History: 1 Diabetes History: 1 Stroke History: 0 Vascular Disease History: 1 Age Score: 1 Gender Score: 0  For questions or updates, please contact [place] Please consult [email or web address] for contact info under     Signed, Almyra Deforest, [place]  01/04/2022 11:08 AM  History and all data above reviewed.  Patient examined.  I agree with the findings as above.   Patient presents with visual hallucinations.  We are asked to see him because of atrial fibrillation with a slow ventricular rate and dizzy episodes.  He reports multiple falls.  He has to walk with a walker.  He describes most of these when he turns suddenly or moves too quickly.  He also gets dizziness when he goes from sitting to standing or lying to standing.  He will get dizzy if he is looking down and suddenly looks up.  He does not describe frank syncope however.  He has not been having any chest pressure, neck or arm discomfort.  He is limited by his mobility issues but does get around in and out of his house.  He does not describe chest pressure, neck or arm discomfort.  He can occasionally feel his heart racing.  He does have a device at home to report his EKG but was unable to pull these up on his phone for me.  The patient exam reveals DDU:[id number]  ,  Lungs: Clear  ,  Abd: Positive bowel sounds, no rebound no guarding, Ext No edema  .  All available labs, radiology testing, previous records reviewed. Agree with documented assessment and plan.   Atrial fib: He does have slow ventricular rate with here but no pauses or symptoms that could or should be associated with presyncope or syncope.   There is no indication for pacing.  Not on any AV nodal blocking agents.  He does tolerate anticoagulation and despite his fall risk I would agree with this.  Echocardiogram was unremarkable as above.  We will plan to do a week monitor.  Jeneen Rinks Kijana Cromie  3:09 PM  01/04/2022

## 2022-01-04 NOTE — Progress Notes (Signed)
Initial Nutrition Assessment  DOCUMENTATION CODES:   Severe malnutrition in context of chronic illness  INTERVENTION:  Liberalize diet from a heart healthy/carb modified to a 2 gram sodium diet to provide widest variety of menu options to enhance nutritional adequacy Ensure Enlive po BID, each supplement provides 350 kcal and 20 grams of protein. MVI with minerals daily  NUTRITION DIAGNOSIS:   Severe Malnutrition related to chronic illness (CHF, DM) as evidenced by severe fat depletion, moderate fat depletion, severe muscle depletion, moderate muscle depletion.  GOAL:   Patient will meet greater than or equal to 90% of their needs  MONITOR:   PO intake, Supplement acceptance, Labs, Diet advancement  REASON FOR ASSESSMENT:   Malnutrition Screening Tool    ASSESSMENT:   Pt admitted with c/o hallucinations. PMH significant for afib, CAD s/p CABG, PAD, diastolic CHF, A4ZY on insulin pump, HTN, HLD.   MRI/EEG unrevealing. Pending neuro evaluation. Now with acute on chronic CHF with large pleural effusion. May need thoracentesis.   Pt reports that he has only eaten once since being on the unit. RN present and reports that he has been receiving meals and has eaten 100%. Pt reports that PTA he was eating at his baseline. He is not able to prepare many meals though but tries to scramble eggs for breakfast. He receives Meals on Wheels 5 days per week.   Pt endorses diabetic neuropathy and retinopathy. He has been receiving eye injections and is planned for surgery for his L eye which has very limited vision. Observed nails. Pt endorses changes to his nails recently including clubbing/curving and vertical lines. He reports taking MVI at home and uses insulin pump to manage blood sugars.   Meal completions: 1/4: 0% breakfast and lunch, 100% lunch 1/5: 100% breakfast  Pt reports that his usual weight is about 174 lbs but reports that it is slightly more elevated recently d/t presence of  pleural effusion. Within the last year, pt's weight noted to fluctuate between 70-80 kg. His weight is +6 kg since 11/17/21 likely d/t HF and volume status.  Edema: moderate pitting BLE (1/4)  Medications: lasix, SSI 0-9 units q4h, remeron, thiamine, IV abx  Labs: iron 37, HgbA1c 7.8%, CBG's 114-152 x24 hours Vitamin B12 550 (wdl), B1 pending  UOP: 748m x12 hours + 2024mx12 hours I/O's: -8331mince admit  NUTRITION - FOCUSED PHYSICAL EXAM:  Flowsheet Row Most Recent Value  Orbital Region Moderate depletion  Upper Arm Region Severe depletion  Thoracic and Lumbar Region Moderate depletion  Buccal Region Severe depletion  Temple Region Moderate depletion  Clavicle Bone Region Severe depletion  Clavicle and Acromion Bone Region Severe depletion  Scapular Bone Region Moderate depletion  Dorsal Hand Severe depletion  Patellar Region Severe depletion  Anterior Thigh Region Severe depletion  Posterior Calf Region Moderate depletion  Edema (RD Assessment) Mild  [BLE]  Hair Reviewed  Eyes Reviewed  Mouth Reviewed  Skin Reviewed  Nails Other (Comment)  [clubbing, yellow/whiteish spots]      Diet Order:   Diet Order             Diet 2 gram sodium Fluid consistency: Thin  Diet effective now                   EDUCATION NEEDS:   Education needs have been addressed  Skin:  Skin Assessment: Reviewed RN Assessment  Last BM:  1/2  Height:   Ht Readings from Last 1 Encounters:  01/03/22 '6\' 2"'$  (1.88  m)    Weight:   Wt Readings from Last 1 Encounters:  01/05/22 80.8 kg   BMI:  Body mass index is 22.87 kg/m.  Estimated Nutritional Needs:   Kcal:  2100-2300  Protein:  105-120g  Fluid:  >/=2L  Clayborne Dana, RDN, LDN Clinical Nutrition

## 2022-01-04 NOTE — Progress Notes (Signed)
Arrived to room for EEG. Echo in room will check back as schedule permits or EEG to be completed.

## 2022-01-04 NOTE — Care Management Obs Status (Signed)
[place] NOTIFICATION   Patient Details  Name: [name] [name] MRN: [id number] Date of Birth: [birth year]   Medicare Observation Status Notification Given:  Yes    Zenon Mayo, RN 01/04/2022, 4:15 PM

## 2022-01-04 NOTE — Assessment & Plan Note (Signed)
Could be contributoring to delirium Measure post void residual  Start Flomax 0.4 mg po q day Renal US to eval hydro Noted hematuria will need urology follow up  Pt refuses foley explained that if he is unable to urinate will need foley placement to avoid kidney damage

## 2022-01-04 NOTE — Telephone Encounter (Signed)
   Cardiac Monitor Alert  Date of alert:  01/04/2022   Patient Name: [name] [name]  DOB: 1955-08-18  MRN: [id number]   [facility] Cardiologist: Evalina Field, MD  [facility] EP:  None    Monitor Information: Long Term Monitor-Live Telemetry [ZioAT]  Reason:  Syncope and collapse Ordering provider:  Almyra Deforest, PA   Alert Atrial Fibrillation/Flutter This is the 1st alert for this rhythm.  The patient has a hx of Atrial Fibrillation/Flutter.    Anticoagulation medication as of 01/04/2022           apixaban (ELIQUIS) 5 MG TABS tablet Take 1 tablet (5 mg total) by mouth 2 (two) times daily. PLEASE SCHEDULE APPT WITH ALYSSA FOR FURTHER REFILLS [phone]       Next Cardiology Appointment   Date:  02/19/2022  Provider:  Coletta Memos NP  The patient could NOT be reached by telephone today.    Patient is currently inpatient at [facility].  Continue to monitor.      Tor Netters, RN  01/04/2022 4:58 PM

## 2022-01-04 NOTE — Assessment & Plan Note (Signed)
Appears intravascular depleted but mild leg edema noted Hold lasix for now  May need gentle rehydration  Check prealbumin

## 2022-01-04 NOTE — TOC Initial Note (Signed)
Transition of Care [facility]) - Initial/Assessment Note    Patient Details  Name: [name] [name] MRN: [id number] Date of Birth: 12-24-1955  [facility] CM/SW Contact:    Zenon Mayo, RN Phone Number: 01/04/2022, 4:21 PM  Clinical Narrative:                 From home with son, uses rollator ,it is in the room with patient, NCM offered choice for HHPT with Medicare .gov list, he does not have a preference.  NCM made referral to [facility] with [facility]. He is able to take referral for HHPT soc will begin 24 to 48 hrs post dc. Son will transport him home at dc. He has PCP.    Expected Discharge Plan: [name] Barriers to Discharge: Continued Medical Work up   Patient Goals and CMS Choice Patient states their goals for this hospitalization and ongoing recovery are:: return home with son CMS Medicare.gov Compare Post Acute Care list provided to:: Patient Choice offered to / list presented to : Patient      Expected Discharge Plan and Services   Discharge Planning Services: CM Consult Post Acute Care Choice: [place] arrangements for the past 2 months: Single Family Home                 DME Arranged: N/A         HH Arranged: PT HH Agency: [facility] Date [facility] Agency Contacted: 01/04/22 Time HH Agency Contacted: 1621 Representative spoke with at [place]: Tommi Rumps  Prior Living Arrangements/Services Living arrangements for the past 2 months: [place] with:: Adult Children Patient language and need for interpreter reviewed:: Yes        Need for Family Participation in Patient Care: Yes (Comment) Care giver support system in place?: Yes (comment) Current home services: DME (rollator) Criminal Activity/Legal Involvement Pertinent to Current Situation/Hospitalization: No - Comment as needed  Activities of Daily Living Home Assistive Devices/Equipment: Eyeglasses, Insulin Pump, Walker (specify type) ADL  Screening (condition at time of admission) Patient's cognitive ability adequate to safely complete daily activities?: Yes Is the patient deaf or have difficulty hearing?: Yes Does the patient have difficulty seeing, even when wearing glasses/contacts?: Yes Does the patient have difficulty concentrating, remembering, or making decisions?: No Patient able to express need for assistance with ADLs?: Yes Does the patient have difficulty dressing or bathing?: No Independently performs ADLs?: Yes (appropriate for developmental age) Does the patient have difficulty walking or climbing stairs?: Yes Weakness of Legs: Both Weakness of Arms/Hands: None  Permission Sought/Granted                  Emotional Assessment Appearance:: Appears stated age Attitude/Demeanor/Rapport: Engaged Affect (typically observed): Appropriate Orientation: : Oriented to Self, Oriented to Place, Oriented to Situation Alcohol / Substance Use: Not Applicable Psych Involvement: No (comment)  Admission diagnosis:  Hallucinations [R44.3] Bradycardia [R00.1] Atrial fibrillation with slow ventricular response ([place]) [I48.91] Atrial fibrillation, unspecified type ([place]) [I48.91] Patient Active Problem List   Diagnosis Date Noted   Acute urinary retention 01/04/2022   Chronic diastolic CHF (congestive heart failure) ([place]) 01/04/2022   Atrial fibrillation with slow ventricular response ([place]) 01/03/2022   Hallucinations 01/03/2022   Hematuria 01/03/2022   Type 2 diabetes mellitus with proliferative retinopathy of both eyes, with long-term current use of insulin ([place]) 08/18/2021   Type 2 diabetes mellitus with diabetic polyneuropathy, with long-term current use of insulin ([place]) 08/18/2021  Diabetes mellitus ([place]) 08/18/2021   Postherpetic neuralgia 08/11/2021   Blood clotting disorder ([place]) 07/12/2021   Type 1 diabetes mellitus ([place]) 02/16/2021   Chronic diarrhea 02/16/2021   Diarrhea 02/15/2021   Sinus bradycardia  02/15/2021   Acute exacerbation of CHF (congestive heart failure) ([place]) 02/14/2021   Iron deficiency anemia secondary to inadequate dietary iron intake 07/14/2020   Diabetic ulcer of left foot associated with type 1 diabetes mellitus ([place]) 06/30/2020   Pressure injury of skin 05/24/2020   BPH with obstruction/lower urinary tract symptoms 03/16/2020   PAF (paroxysmal atrial fibrillation) ([place]) 03/16/2020   Type 1 diabetes mellitus with diabetic foot infection ([place]) 01/27/2020   Normocytic anemia 01/13/2020   Encounter for general adult medical examination with abnormal findings 01/13/2020   Hyperlipidemia LDL goal <70 01/13/2020   Benign prostatic hyperplasia without lower urinary tract symptoms 01/13/2020   PVD (peripheral vascular disease) ([place]) 01/13/2020   Coronary artery disease involving bypass graft of transplanted heart without angina pectoris 01/13/2020   Paroxysmal atrial fibrillation ([place]) 01/13/2020   Recurrent major depressive disorder, in partial remission ([place]) 01/13/2020   Major depression, recurrent ([place]) 12/09/2019   Tear of lateral meniscus of knee 10/05/2019   Aortic dissection ([place]) 02/05/2019   Coronary artery disease 02/05/2019   Hypertension 02/05/2019   Psoriasis 02/05/2019   Vitamin D deficiency 01/07/2019   Uncontrolled type 2 diabetes mellitus with hyperglycemia ([place]) 01/07/2019   Microalbuminuria 01/07/2019   Depression 06/10/2018   Patellar tendonitis of right knee 04/24/2018   Peyronie's disease 06/09/2015   Erectile dysfunction 03/15/2015   S/P aortic aneurysm repair 05/19/2012   S/P CABG (coronary artery bypass graft) 05/19/2012   Non-proliferative diabetic retinopathy ([place]) 02/27/2011   Dupuytren's contracture of both hands 11/01/2010   PCP:  Fredirick Lathe, PA-C Pharmacy:   [facility] [id number], [place] [address] [place] [address] Phone: [phone] Fax: [phone]  [facility] (New Address) - [place] Previously: [address] [place] [address] Phone: [phone] Fax: [phone]  [id number] [id number]: [phone]: [phone] [id number] [place], [address] [place]: [phone]: [phone]   Social Determinants of Health (SDOH) Social History: [place] Insecurity (01/03/2022)  Housing: Low Risk  (01/03/2022)  Transportation Needs: No Transportation Needs (01/03/2022)  Utilities: Not At Risk (01/03/2022)  Alcohol Screen: Low Risk  (10/23/2019)  Depression (PHQ2-9): Low Risk  (07/14/2020)  Financial Resource Strain: Unknown (10/23/2019)  Physical Activity: Inactive (10/23/2019)  Social Connections: Socially Isolated (10/23/2019)  Stress: Stress Concern Present (10/23/2019)  Tobacco Use: Medium Risk (01/04/2022)   SDOH Interventions:     Readmission Risk Interventions     No data to display

## 2022-01-04 NOTE — Telephone Encounter (Signed)
Calling with abnormal EKG results. Call transferred

## 2022-01-04 NOTE — Evaluation (Signed)
Occupational Therapy Evaluation Patient Details Name: [name] [name] MRN: [id number] DOB: [birth year] Today's Date: 01/04/2022   History of Present Illness Pt is a 67 y.o. male who presented 01/03/21 with hallucinations. Noted to be bradycardic. PMH: A-fib on Eliquis, CAD status post CABG, PAD, diastolic CHF, DM2 on insulin pump, HTN, HLD, depression   Clinical Impression   Pt's son lives with him and works during the day. Pt  typically walks with a rollator and has a long hx of falls. He reports inconsistency with completing his ADLs, sometimes leaving the same socks on for 4 days, but is modified independent (sponge bathing and dressing). He states his son manages his meds, but pays his own bills and cooks light meals, including stove use. Pt has not driven in over a year. Pt presents with poor standing balance with LOB when releasing walker in static standing during ADLs. He walks with min guard assist with his rollator. Pt demonstrates impaired cognition including poor awareness of safety and deficits. Recommending close supervision at home and [place].      Recommendations for follow up therapy are one component of a multi-disciplinary discharge planning process, led by the attending physician.  Recommendations may be updated based on patient status, additional functional criteria and insurance authorization.   Follow Up Recommendations  Home health OT     Assistance Recommended at Discharge Frequent or constant Supervision/Assistance  Patient can return home with the following A little help with walking and/or transfers;A lot of help with bathing/dressing/bathroom;Assistance with cooking/housework;Direct supervision/assist for medications management;Direct supervision/assist for financial management;Assist for transportation;Help with stairs or ramp for entrance    Functional Status Assessment  Patient has had a recent decline in their functional status and/or demonstrates limited ability  to make significant improvements in function in a reasonable and predictable amount of time  Equipment Recommendations  None recommended by OT    Recommendations for Other Services       Precautions / Restrictions Precautions Precautions: Fall;Other (comment) Precaution Comments: watch BP and HR Restrictions Weight Bearing Restrictions: No      Mobility Bed Mobility Overal bed mobility: Needs Assistance Bed Mobility: Sit to Supine       Sit to supine: Min assist   General bed mobility comments: assisted LEs into bed    Transfers Overall transfer level: Needs assistance Equipment used: Rollator (4 wheels) Transfers: Sit to/from Stand Sit to Stand: Min guard           General transfer comment: increased time, stabilizes LEs on bed      Balance Overall balance assessment: Needs assistance   Sitting balance-Leahy Scale: Fair     Standing balance support: Bilateral upper extremity supported Standing balance-Leahy Scale: Poor Standing balance comment: pt needing assist for balance when managing pants                           ADL either performed or assessed with clinical judgement   ADL Overall ADL's : Needs assistance/impaired Eating/Feeding: Set up;Sitting   Grooming: Wash/dry hands;Sitting;Set up   Upper Body Bathing: Minimal assistance;Sitting   Lower Body Bathing: Moderate assistance;Sit to/from stand   Upper Body Dressing : Minimal assistance;Sitting   Lower Body Dressing: Moderate assistance;Sit to/from stand   Toilet Transfer: Engineer, manufacturing (4 wheels);Ambulation   Toileting- Clothing Manipulation and Hygiene: Moderate assistance;Sit to/from stand       Functional mobility during ADLs: Engineer, manufacturing (4 wheels)       Vision  Ability to See in Adequate Light: 0 Adequate Patient Visual Report: No change from baseline       Perception     Praxis      Pertinent Vitals/Pain Pain Assessment Pain Assessment:  No/denies pain     Hand Dominance Right   Extremity/Trunk Assessment Upper Extremity Assessment Upper Extremity Assessment: Overall WFL for tasks assessed (likes to wear gloves, states he gets cold)   Lower Extremity Assessment Lower Extremity Assessment: Defer to PT evaluation   Cervical / Trunk Assessment Cervical / Trunk Assessment: Kyphotic   Communication Communication Communication: HOH   Cognition Arousal/Alertness: Awake/alert Behavior During Therapy: WFL for tasks assessed/performed Overall Cognitive Status: Impaired/Different from baseline Area of Impairment: Attention, Memory, Safety/judgement, Awareness, Problem solving                   Current Attention Level: Selective Memory: Decreased short-term memory   Safety/Judgement: Decreased awareness of deficits, Decreased awareness of safety Awareness: Emergent Problem Solving: Slow processing, Requires verbal cues General Comments: no family to determine baseline     General Comments       Exercises     Shoulder Instructions      Home Living Family/patient expects to be discharged to:: Private residence Living Arrangements: Children Available Help at Discharge: Family;Available PRN/intermittently Type of Home: Apartment (senior housing) Home Access: Level entry     Home Layout: One level     Bathroom Shower/Tub: Tub/shower unit;Sponge bathes at [place] (4 wheels);Wheelchair - Biomedical scientist Comments: sleeps in recliner majority of time; bathroom is about 10 steps from where he sleeps      Prior Functioning/Environment Prior Level of Function : Needs assist             Mobility Comments: Uses rollator for mobility in home but uses w/c for going to appointments as pt fatigues easily. Hx of falls ADLs Comments: Mod I with ADLs.Reports he rarely changes his socks, states he doesn't want to. Reports his son  manages his meds and he pays his own bills as well as prepares light meals with stove use. Has not driven in over a year. Reliant on son for transportation.        OT Problem List: Decreased strength;Impaired balance (sitting and/or standing);Decreased cognition;Decreased safety awareness      OT Treatment/Interventions: Self-care/ADL training;Balance training;Patient/family education;Cognitive remediation/compensation;Therapeutic activities;DME and/or AE instruction    OT Goals(Current goals can be found in the care plan section) Acute Rehab OT Goals OT Goal Formulation: With patient Time For Goal Achievement: 01/18/22 Potential to Achieve Goals: Good ADL Goals Pt Will Perform Grooming: with supervision;standing Pt Will Perform Lower Body Bathing: with supervision;sit to/from stand Pt Will Perform Lower Body Dressing: with supervision;sit to/from stand Pt Will Transfer to Toilet: with supervision;ambulating Pt Will Perform Toileting - Clothing Manipulation and hygiene: with supervision;sit to/from stand Additional ADL Goal #1: Pt will participate in formal cognitive screening. Additional ADL Goal #2: Pt will state at least 3 fall prevention strategies as instructed.  OT Frequency: Min 2X/week    Co-evaluation              AM-PAC OT "6 Clicks" Daily Activity     Outcome Measure Help from another person eating meals?: None Help from another person taking care of personal grooming?: A Little Help from another person toileting, which includes using toliet, bedpan, or urinal?: A Lot Help from another person bathing (including  washing, rinsing, drying)?: A Lot Help from another person to put on and taking off regular upper body clothing?: A Little Help from another person to put on and taking off regular lower body clothing?: A Lot 6 Click Score: 16   End of Session Equipment Utilized During Treatment: Gait belt;Rollator (4 wheels)  Activity Tolerance: Patient tolerated  treatment well Patient left: in bed;with call bell/phone within reach;with bed alarm set  OT Visit Diagnosis: Unsteadiness on feet (R26.81);Other abnormalities of gait and mobility (R26.89);History of falling (Z91.81);Muscle weakness (generalized) (M62.81);Other symptoms and signs involving cognitive function                Time: 9791-5041 OT Time Calculation (min): 16 min Charges:  OT General Charges $OT Visit: 1 Visit OT Evaluation $OT Eval Moderate Complexity: [name] [name], OTR/L Acute Rehabilitation Services Office: [phone]   [name] [name] 01/04/2022, 5:26 PM

## 2022-01-04 NOTE — Progress Notes (Signed)
Discussed with Dr. Percival Spanish, will place 2 week Zio AT live monitor in the hospital. Outpatient follow up scheduled in 6 weeks

## 2022-01-04 NOTE — Progress Notes (Signed)
  Echocardiogram 2D Echocardiogram attempted at 0820. Pt is with care team. Will attempt again later.  Eartha Inch 01/04/2022, 8:20 AM

## 2022-01-04 NOTE — Progress Notes (Signed)
EEG complete - results pending 

## 2022-01-04 NOTE — Evaluation (Signed)
Physical Therapy Evaluation Patient Details Name: [name] [name] MRN: [id number] DOB: February 27, 1955 Today's Date: 01/04/2022  History of Present Illness  Pt is a 67 y.o. male who presented 01/03/21 with hallucinations. Noted to be bradycardic. PMH: A-fib on Eliquis, CAD status post CABG, PAD, diastolic CHF, DM2 on insulin pump, HTN, HLD, depression   Clinical Impression  Pt presents with condition above and deficits mentioned below, see PT Problem List. PTA, he was mod I using a rollator for household mobility and a w/c to go to appointments, living with his son in a senior living ground floor apartment. Pt is reliant on his son for transportation. His son works and family denies 24/7 support availability. Currently, pt is demonstrating a posterior bias along with deficits in strength, static and dynamic balance, power, and activity tolerance that place him at risk for falls. Pt required minA to recover x3 posterior LOB bouts when he tried to reach mildly off BOS with only 1 UE support. Pt primarily required only min guard assist for safety when ambulating using his rollator though. Pt educated on his risk for falls and verbalized understanding and desire to return home with HHPT follow-up, which would be beneficial in addressing his mentioned deficits to reduce his risk for further falls. Will continue to follow acutely.    HR as low as 40s at rest, up to 100s with gait BP 185/93 supine, 127/84 standing, 167/97 sitting after ambulating (denied lightheadedness this session but reports hx of it when he initially gets up to stand at times)    Recommendations for follow up therapy are one component of a multi-disciplinary discharge planning process, led by the attending physician.  Recommendations may be updated based on patient status, additional functional criteria and insurance authorization.  Follow Up Recommendations Home health PT      Assistance Recommended at Discharge Intermittent  Supervision/Assistance  Patient can return home with the following  A little help with walking and/or transfers;A little help with bathing/dressing/bathroom;Assistance with cooking/housework;Assist for transportation    Equipment Recommendations None recommended by PT  Recommendations for Other Services       Functional Status Assessment Patient has had a recent decline in their functional status and demonstrates the ability to make significant improvements in function in a reasonable and predictable amount of time.     Precautions / Restrictions Precautions Precautions: Fall;Other (comment) Precaution Comments: watch BP and HR Restrictions Weight Bearing Restrictions: No      Mobility  Bed Mobility Overal bed mobility: Needs Assistance Bed Mobility: Supine to Sit     Supine to sit: Min guard, HOB elevated     General bed mobility comments: Extra time and cues needed to use bed rails to ascend trunk to sit L EOB, HOB elevated ~20 degrees.    Transfers Overall transfer level: Needs assistance Equipment used: Rollator (4 wheels) Transfers: Sit to/from Stand Sit to Stand: Min guard           General transfer comment: Extra time to power up to stand and gain stability with pt leaning legs posteriorly against bed for support, min guard for safety    Ambulation/Gait Ambulation/Gait assistance: Min guard, Min assist Gait Distance (Feet): 155 Feet Assistive device: Rollator (4 wheels) Gait Pattern/deviations: Step-through pattern, Decreased stride length Gait velocity: reduced Gait velocity interpretation: <1.8 ft/sec, indicate of risk for recurrent falls   General Gait Details: Pt with slow, mildly unsteady gait and small steps. Pt with x3 posterior LOB initially, primarily when standing with 1  UE support and reaching mildly off BOS with the other (not while ambulating), needing minA to recover, otherwise min guard for safety.  Stairs            Wheelchair  Mobility    Modified Rankin (Stroke Patients Only) Modified Rankin (Stroke Patients Only) Pre-Morbid Rankin Score: Moderate disability Modified Rankin: Moderate disability     Balance Overall balance assessment: Needs assistance Sitting-balance support: No upper extremity supported, Feet supported Sitting balance-Leahy Scale: Fair   Postural control: Posterior lean Standing balance support: Single extremity supported, Bilateral upper extremity supported, During functional activity, Reliant on assistive device for balance Standing balance-Leahy Scale: Poor Standing balance comment: Pt with x3 posterior LOB when standing with 1 UE support and reaching mildly off BOS with other, needing minA to recover. Benefits from bil UE support for stability.                             Pertinent Vitals/Pain Pain Assessment Pain Assessment: Faces Faces Pain Scale: Hurts a little bit Pain Location: knees Pain Descriptors / Indicators: Discomfort Pain Intervention(s): Limited activity within patient's tolerance, Monitored during session, Repositioned    Home Living Family/patient expects to be discharged to:: Private residence Living Arrangements: Children (son) Available Help at Discharge: Family;Available PRN/intermittently Type of Home: Apartment (senior housing) Home Access: Level entry       Home Layout: One [place]: [place] (4 wheels);Tub bench;Grab bars - tub/shower;Wheelchair - manual;Other (comment) (grab bars are suction) Additional Comments: sleeps in recliner majority of time; bathroom is about 10 steps from where he sleeps    Prior Function Prior Level of Function : Needs assist       Physical Assist : ADLs (physical)   ADLs (physical): IADLs Mobility Comments: Uses rollator for mobility in home but uses w/c for going to appointments as pt fatigues easily. Hx of falls ADLs Comments: Mod I with ADLs. Reliant on son for transportation.     Hand  Dominance        Extremity/Trunk Assessment   Upper Extremity Assessment Upper Extremity Assessment: Defer to OT evaluation    Lower Extremity Assessment Lower Extremity Assessment: RLE deficits/detail;LLE deficits/detail RLE Deficits / Details: Grossly 4 to 4+ strength, hx of peripheral neuropathy RLE Sensation: history of peripheral neuropathy LLE Deficits / Details: Grossly 4 to 4+ strength, hx of peripheral neuropathy LLE Sensation: history of peripheral neuropathy    Cervical / Trunk Assessment Cervical / Trunk Assessment: Kyphotic  Communication   Communication: HOH  Cognition Arousal/Alertness: Awake/alert Behavior During Therapy: WFL for tasks assessed/performed Overall Cognitive Status: No family/caregiver present to determine baseline cognitive functioning                                 General Comments: Pt appears to be oriented, able to correctly identify the exact date. Denies any hallucinations during this session. Distracted by lines intermittently and with some slowed processing. Likely close to baseline, but no family present to confirm.        General Comments General comments (skin integrity, edema, etc.): HR as low as 40s at rest, up to 100s with gait; BP 185/93 supine, 127/84 standing, 167/97 sitting after ambulating (denied lightheadedness this session but reports hx of it when he initially gets up to stand at times)    Exercises     Assessment/Plan    PT Assessment Patient needs  continued PT services  PT Problem List Decreased strength;Decreased activity tolerance;Decreased mobility;Decreased balance;Cardiopulmonary status limiting activity       PT Treatment Interventions DME instruction;Gait training;Functional mobility training;Therapeutic activities;Therapeutic exercise;Balance training;Neuromuscular re-education;Cognitive remediation;Patient/family education    PT Goals (Current goals can be found in the Care Plan section)   Acute Rehab PT Goals Patient Stated Goal: to golf again PT Goal Formulation: With patient Time For Goal Achievement: 01/18/22 Potential to Achieve Goals: Good    Frequency Min 3X/week     Co-evaluation               AM-PAC PT "6 Clicks" Mobility  Outcome Measure Help needed turning from your back to your side while in a flat bed without using bedrails?: A Little Help needed moving from lying on your back to sitting on the side of a flat bed without using bedrails?: A Little Help needed moving to and from a bed to a chair (including a wheelchair)?: A Little Help needed standing up from a chair using your arms (e.g., wheelchair or bedside chair)?: A Little Help needed to walk in hospital room?: A Little Help needed climbing 3-5 steps with a railing? : A Lot 6 Click Score: 17    End of Session Equipment Utilized During Treatment: Gait belt Activity Tolerance: Patient tolerated treatment well Patient left: in chair;with call bell/phone within reach;with chair alarm set Nurse Communication: Mobility status;Other (comment) (vitals) PT Visit Diagnosis: Unsteadiness on feet (R26.81);Other abnormalities of gait and mobility (R26.89);Muscle weakness (generalized) (M62.81);History of falling (Z91.81);Difficulty in walking, not elsewhere classified (R26.2)    Time: 0826-0900 PT Time Calculation (min) (ACUTE ONLY): 34 min   Charges:   PT Evaluation $PT Eval Moderate Complexity: 1 Mod PT Treatments $Therapeutic Activity: 8-22 mins        Moishe Spice, PT, DPT Acute Rehabilitation Services  Office: [phone]   Orvan Falconer 01/04/2022, 9:14 AM

## 2022-01-05 ENCOUNTER — Other Ambulatory Visit: Payer: Self-pay | Admitting: Physician Assistant

## 2022-01-05 ENCOUNTER — Telehealth: Payer: Self-pay | Admitting: Cardiovascular Disease

## 2022-01-05 DIAGNOSIS — Z6821 Body mass index (BMI) 21.0-21.9, adult: Secondary | ICD-10-CM | POA: Diagnosis not present

## 2022-01-05 DIAGNOSIS — Z7982 Long term (current) use of aspirin: Secondary | ICD-10-CM | POA: Diagnosis not present

## 2022-01-05 DIAGNOSIS — L89152 Pressure ulcer of sacral region, stage 2: Secondary | ICD-10-CM | POA: Diagnosis present

## 2022-01-05 DIAGNOSIS — I5032 Chronic diastolic (congestive) heart failure: Secondary | ICD-10-CM | POA: Diagnosis not present

## 2022-01-05 DIAGNOSIS — I4819 Other persistent atrial fibrillation: Secondary | ICD-10-CM | POA: Diagnosis present

## 2022-01-05 DIAGNOSIS — I110 Hypertensive heart disease with heart failure: Secondary | ICD-10-CM | POA: Diagnosis present

## 2022-01-05 DIAGNOSIS — R319 Hematuria, unspecified: Secondary | ICD-10-CM | POA: Diagnosis present

## 2022-01-05 DIAGNOSIS — I25810 Atherosclerosis of coronary artery bypass graft(s) without angina pectoris: Secondary | ICD-10-CM | POA: Diagnosis not present

## 2022-01-05 DIAGNOSIS — E1165 Type 2 diabetes mellitus with hyperglycemia: Secondary | ICD-10-CM | POA: Diagnosis present

## 2022-01-05 DIAGNOSIS — R443 Hallucinations, unspecified: Secondary | ICD-10-CM | POA: Diagnosis present

## 2022-01-05 DIAGNOSIS — F3341 Major depressive disorder, recurrent, in partial remission: Secondary | ICD-10-CM

## 2022-01-05 DIAGNOSIS — I5033 Acute on chronic diastolic (congestive) heart failure: Secondary | ICD-10-CM | POA: Diagnosis present

## 2022-01-05 DIAGNOSIS — Z91048 Other nonmedicinal substance allergy status: Secondary | ICD-10-CM | POA: Diagnosis not present

## 2022-01-05 DIAGNOSIS — E876 Hypokalemia: Secondary | ICD-10-CM | POA: Diagnosis present

## 2022-01-05 DIAGNOSIS — N390 Urinary tract infection, site not specified: Secondary | ICD-10-CM

## 2022-01-05 DIAGNOSIS — R296 Repeated falls: Secondary | ICD-10-CM | POA: Diagnosis present

## 2022-01-05 DIAGNOSIS — Z7901 Long term (current) use of anticoagulants: Secondary | ICD-10-CM | POA: Diagnosis not present

## 2022-01-05 DIAGNOSIS — R441 Visual hallucinations: Secondary | ICD-10-CM | POA: Diagnosis present

## 2022-01-05 DIAGNOSIS — R55 Syncope and collapse: Secondary | ICD-10-CM | POA: Diagnosis not present

## 2022-01-05 DIAGNOSIS — I2510 Atherosclerotic heart disease of native coronary artery without angina pectoris: Secondary | ICD-10-CM | POA: Diagnosis present

## 2022-01-05 DIAGNOSIS — Z79899 Other long term (current) drug therapy: Secondary | ICD-10-CM | POA: Diagnosis not present

## 2022-01-05 DIAGNOSIS — D649 Anemia, unspecified: Secondary | ICD-10-CM | POA: Diagnosis present

## 2022-01-05 DIAGNOSIS — Z800 Family history of malignant neoplasm of digestive organs: Secondary | ICD-10-CM | POA: Diagnosis not present

## 2022-01-05 DIAGNOSIS — Z807 Family history of other malignant neoplasms of lymphoid, hematopoietic and related tissues: Secondary | ICD-10-CM | POA: Diagnosis not present

## 2022-01-05 DIAGNOSIS — I4891 Unspecified atrial fibrillation: Secondary | ICD-10-CM | POA: Diagnosis not present

## 2022-01-05 DIAGNOSIS — Z794 Long term (current) use of insulin: Secondary | ICD-10-CM | POA: Diagnosis not present

## 2022-01-05 DIAGNOSIS — R338 Other retention of urine: Secondary | ICD-10-CM | POA: Diagnosis not present

## 2022-01-05 DIAGNOSIS — R42 Dizziness and giddiness: Secondary | ICD-10-CM | POA: Diagnosis not present

## 2022-01-05 DIAGNOSIS — I10 Essential (primary) hypertension: Secondary | ICD-10-CM | POA: Diagnosis not present

## 2022-01-05 DIAGNOSIS — E785 Hyperlipidemia, unspecified: Secondary | ICD-10-CM | POA: Diagnosis present

## 2022-01-05 DIAGNOSIS — Z9641 Presence of insulin pump (external) (internal): Secondary | ICD-10-CM | POA: Diagnosis present

## 2022-01-05 DIAGNOSIS — Z951 Presence of aortocoronary bypass graft: Secondary | ICD-10-CM | POA: Diagnosis not present

## 2022-01-05 DIAGNOSIS — E43 Unspecified severe protein-calorie malnutrition: Secondary | ICD-10-CM | POA: Diagnosis present

## 2022-01-05 HISTORY — DX: Urinary tract infection, site not specified: N39.0

## 2022-01-05 LAB — GLUCOSE, CAPILLARY
Glucose-Capillary: 144 mg/dL — ABNORMAL HIGH (ref 70–99)
Glucose-Capillary: 148 mg/dL — ABNORMAL HIGH (ref 70–99)
Glucose-Capillary: 152 mg/dL — ABNORMAL HIGH (ref 70–99)
Glucose-Capillary: 175 mg/dL — ABNORMAL HIGH (ref 70–99)
Glucose-Capillary: 182 mg/dL — ABNORMAL HIGH (ref 70–99)
Glucose-Capillary: 296 mg/dL — ABNORMAL HIGH (ref 70–99)

## 2022-01-05 LAB — BASIC METABOLIC PANEL
Anion gap: 8 (ref 5–15)
BUN: 14 mg/dL (ref 8–23)
CO2: 30 mmol/L (ref 22–32)
Calcium: 8.2 mg/dL — ABNORMAL LOW (ref 8.9–10.3)
Chloride: 97 mmol/L — ABNORMAL LOW (ref 98–111)
Creatinine, Ser: 0.91 mg/dL (ref 0.61–1.24)
GFR, Estimated: >60 mL/min (ref >60)
Glucose, Bld: 148 mg/dL — ABNORMAL HIGH (ref 70–99)
Potassium: 3.7 mmol/L (ref 3.5–5.1)
Sodium: 135 mmol/L (ref 135–145)

## 2022-01-05 LAB — CBC
HCT: 40.2 % (ref 39.0–52.0)
Hemoglobin: 13.2 g/dL (ref 13.0–17.0)
MCH: 28.9 pg (ref 26.0–34.0)
MCHC: 32.8 g/dL (ref 30.0–36.0)
MCV: 88 fL (ref 80.0–100.0)
Platelets: 195 10*3/uL (ref 150–400)
RBC: 4.57 MIL/uL (ref 4.22–5.81)
RDW: 15.8 % — ABNORMAL HIGH (ref 11.5–15.5)
WBC: 6.3 10*3/uL (ref 4.0–10.5)
nRBC: 0 % (ref 0.0–0.2)

## 2022-01-05 MED ORDER — ADULT MULTIVITAMIN W/MINERALS CH
1.0000 | ORAL_TABLET | Freq: Every day | ORAL | Status: DC
  Administered 2022-01-05 – 2022-01-06 (×2): 1 via ORAL
  Filled 2022-01-05 (×2): qty 1

## 2022-01-05 MED ORDER — ENSURE ENLIVE PO LIQD
237.0000 mL | Freq: Two times a day (BID) | ORAL | Status: DC
  Administered 2022-01-06: 237 mL via ORAL

## 2022-01-05 MED ORDER — INSULIN ASPART 100 UNIT/ML IJ SOLN
0.0000 [IU] | Freq: Every day | INTRAMUSCULAR | Status: DC
  Administered 2022-01-05: 3 [IU] via SUBCUTANEOUS

## 2022-01-05 MED ORDER — INSULIN ASPART 100 UNIT/ML IJ SOLN
0.0000 [IU] | Freq: Three times a day (TID) | INTRAMUSCULAR | Status: DC
  Administered 2022-01-05: 3 [IU] via SUBCUTANEOUS
  Administered 2022-01-06: 5 [IU] via SUBCUTANEOUS

## 2022-01-05 MED ORDER — INSULIN DETEMIR 100 UNIT/ML ~~LOC~~ SOLN
6.0000 [IU] | Freq: Every day | SUBCUTANEOUS | Status: DC
  Administered 2022-01-05: 6 [IU] via SUBCUTANEOUS
  Filled 2022-01-05 (×2): qty 0.06

## 2022-01-05 MED ORDER — LOSARTAN POTASSIUM 50 MG PO TABS
50.0000 mg | ORAL_TABLET | Freq: Every day | ORAL | Status: DC
  Administered 2022-01-06: 50 mg via ORAL
  Filled 2022-01-05: qty 1

## 2022-01-05 NOTE — Progress Notes (Signed)
PROGRESS NOTE    [name] [name]  ALP:[id number] DOB: [birth year] DOA: 01/03/2022 PCP: Fredirick Lathe, PA-C    Brief Narrative:  67 y.o. male with medical history significant of A-fib on Eliquis, CAD status post CABG, PAD, diastolic CHF, type 2 diabetes on insulin pump hypertension hyperlipidemia.   Admitted for   Bradycardia, Hallucinations     Assessment and Plan: Atrial fibrillation with slow ventricular response (HCC) TSH WNL  -echo done -cards was consulted- plan for zio patch -HR appropriately goes up with exertion-- not on any rate lowering meds   Hallucinations MRI/EEG unrevealing Patient would probably benefit from long-term follow-up with neurology and have a neuro psych eval -B1 pending -per family hallucinations started suddenly on Monday- ? Infection - await culture vs continued issues with eyes (follows with Dr. Coralyn Pear and was to have surgery)  UTI -culture pending -start abx  Uncontrolled type 2 diabetes mellitus with hyperglycemia ([place]) -SSI -hold pump for now   Coronary artery disease Chronic stable continue  -Lipitor   Hypertension Resume Cozaar- increase dose  -IV lasix -adjust up as able   PAF (paroxysmal atrial fibrillation) (HCC) Continue Eliquis at home dose of 5 mg p.o. twice daily -not on any rate lowering meds     Hypokalemia -replete   Acute urinary retention Could be contributoring to delirium Measure post void residual  Start Flomax 0.4 mg po q day Renal US: Large bilateral pleural effusions are noted increased from prior plain film.     Acute on  Chronic diastolic CHF (congestive heart failure) (HCC) with large pleural effusions -IV lasix and monitor -may need thoracentesis if fluid not improved although is asytomatic   DVT prophylaxis:  apixaban (ELIQUIS) tablet 5 mg    Code Status: Full Code Family Communication: called son (patient lives with him)  Disposition Plan:  Level of care: Telemetry Cardiac Status  DZ:HGDJ    Consultants:  cards   Subjective: C/o being cold  Objective: Vitals:   01/05/22 0006 01/05/22 0440 01/05/22 0727 01/05/22 1018  BP: (!) 177/81 (!) 177/80 133/81 (!) 173/85  Pulse: (!) 51 61 (!) 49 (!) 58  Resp: '19 19 20 18  '$ Temp: (!) 97.1 F (36.2 C) 98.4 F (36.9 C) 98.3 F (36.8 C) 98.1 F (36.7 C)  TempSrc: Oral Oral Oral Oral  SpO2: 93% 94% 95% 98%  Weight: 80.8 kg     Height:        Intake/Output Summary (Last 24 hours) at 01/05/2022 1133 Last data filed at 01/05/2022 1020 Gross per 24 hour  Intake 580 ml  Output 950 ml  Net -370 ml   Filed Weights   01/03/22 2200 01/04/22 0002 01/05/22 0006  Weight: 78.1 kg 78.2 kg 80.8 kg    Examination:    General: Appearance:    Well developed, well nourished male in no acute distress     Lungs:     Diminished at bases,  respirations unlabored  Heart:    Bradycardic.   MS:   All extremities are intact.   Neurologic:   Awake, alert, more appropriate today         Data Reviewed: I have personally reviewed following labs and imaging studies  CBC: Recent Labs  Lab 01/03/22 1235 01/03/22 1910 01/04/22 0400 01/05/22 0758  WBC 5.0  --  5.1 6.3  HGB 12.6* 12.9* 11.6* 13.2  HCT 38.7* 38.0* 35.7* 40.2  MCV 88.6  --  88.4 88.0  PLT 176  --  171 195  Basic Metabolic Panel: Recent Labs  Lab 01/03/22 1235 01/03/22 1910 01/03/22 2006 29-Jan-2022 0400 01/05/22 0758  NA 134* 137  --  131* 135  K 3.5 3.5  --  3.0* 3.7  CL 95*  --   --  95* 97*  CO2 32  --   --  30 30  GLUCOSE 302*  --   --  119* 148*  BUN 14  --   --  12 14  CREATININE 0.76  --   --  0.65 0.91  CALCIUM 8.8*  --   --  7.9* 8.2*  MG  --   --  1.7 1.8  --   PHOS  --   --  3.6 3.5  --    GFR: Estimated Creatinine Clearance: 88.8 mL/min (by C-G formula based on SCr of 0.91 mg/dL). Liver Function Tests: Recent Labs  Lab 01/03/22 1235 January 29, 2022 0400  AST 16 17  ALT 7 9  ALKPHOS 93 79  BILITOT 0.7 0.8  PROT 6.6 5.3*  ALBUMIN 3.3*  2.4*   No results for input(s): "LIPASE", "AMYLASE" in the last 168 hours. Recent Labs  Lab 01/03/22 1907  AMMONIA 34   Coagulation Profile: No results for input(s): "INR", "PROTIME" in the last 168 hours. Cardiac Enzymes: Recent Labs  Lab 01/29/22 0400  CKTOTAL 104   BNP (last 3 results) No results for input(s): "PROBNP" in the last 8760 hours. HbA1C: No results for input(s): "HGBA1C" in the last 72 hours. CBG: Recent Labs  Lab Jan 29, 2022 2030 01/05/22 0009 01/05/22 0442 01/05/22 0725 01/05/22 1028  GLUCAP 121* 148* 152* 144* 175*   Lipid Profile: No results for input(s): "CHOL", "HDL", "LDLCALC", "TRIG", "CHOLHDL", "LDLDIRECT" in the last 72 hours. Thyroid Function Tests: Recent Labs    01/03/22 2007  TSH 4.051   Anemia Panel: Recent Labs    01/03/22 2006 01-29-22 0400  VITAMINB12 550  --   FOLATE 19.4  --   FERRITIN  --  147  TIBC  --  270  IRON  --  37*  RETICCTPCT  --  1.4   Sepsis Labs: No results for input(s): "PROCALCITON", "LATICACIDVEN" in the last 168 hours.  Recent Results (from the past 240 hour(s))  Urine Culture     Status: None (Preliminary result)   Collection Time: 01/29/22  1:27 AM   Specimen: Urine, Clean Catch  Result Value Ref Range Status   Specimen Description URINE, CLEAN CATCH  Final   Special Requests NONE  Final   Culture   Final    CULTURE REINCUBATED FOR BETTER GROWTH Performed at [facility], [address], [place], [place] [address]    Report Status PENDING  Incomplete         Radiology Studies: EEG adult  Result Date: 2022-01-29 Lora Havens, MD     01/29/2022 12:47 PM Patient Name: [name] [name] MRN: [id number] Epilepsy Attending: Lora Havens Referring Physician/Provider: Toy Baker, MD Date: 01-29-22 Duration: 25.59 mins Patient history: 67 year old male with altered mental status.  EEG to evaluate for seizure. Level of alertness: Awake AEDs during EEG study: Xanax Technical  aspects: This EEG study was done with scalp electrodes positioned according to the 10-20 International system of electrode placement. Electrical activity was reviewed with band pass filter of 1-'70Hz'$ , sensitivity of 7 uV/mm, display speed of 30m/sec with a '60Hz'$  notched filter applied as appropriate. EEG data were recorded continuously and digitally stored.  Video monitoring was available and reviewed as appropriate. Description:  The posterior dominant rhythm consists of 9-10 Hz activity of moderate voltage (25-35 uV) seen predominantly in posterior head regions, symmetric and reactive to eye opening and eye closing. Sleep was characterized by vertex waves, sleep spindles (12 to 14 Hz), maximal frontocentral region. There is an excessive amount of 15 to 18 Hz beta activity distributed symmetrically and diffusely.  Hyperventilation and photic stimulation were not performed.   ABNORMALITY - Excessive beta, generalized IMPRESSION: This study is within normal limits. The excessive beta activity seen in the background is most likely due to the effect of benzodiazepine and is a benign EEG pattern. No seizures or epileptiform discharges were seen throughout the recording. Lora Havens   ECHOCARDIOGRAM COMPLETE  Result Date: 01/04/2022    ECHOCARDIOGRAM REPORT   Patient Name:   [name] [name] Date of Exam: 01/04/2022 Medical Rec #:  [id number]            Height:       74.0 in Accession #:    [id number]           Weight:       172.4 lb Date of Birth:  07-May-1955             BSA:          2.041 m Patient Age:    61 years             BP:           185/93 mmHg Patient Gender: M                    HR:           50 bpm. Exam Location:  Inpatient Procedure: 2D Echo, Cardiac Doppler and Color Doppler Indications:    Abnormal ECG  History:        Patient has prior history of Echocardiogram examinations, most                 recent 02/15/2021. CHF, CAD, Prior CABG, PAD, Arrythmias:Atrial                 Fibrillation; Risk  Factors:Diabetes, Hypertension and                 Dyslipidemia.  Sonographer:    Eartha Inch Referring Phys: [id number] ANASTASSIA DOUTOVA  Sonographer Comments: Technically challenging study due to limited acoustic windows. Image acquisition challenging due to patient body habitus and Image acquisition challenging due to respiratory motion. IMPRESSIONS  1. Left ventricular ejection fraction, by estimation, is 60 to 65%. The left ventricle has normal function. The left ventricle has no regional wall motion abnormalities. There is moderate asymmetric left ventricular hypertrophy of the basal-septal segment. Left ventricular diastolic parameters are indeterminate.  2. Right ventricular systolic function is normal. The right ventricular size is normal. Tricuspid regurgitation signal is inadequate for assessing PA pressure.  3. The mitral valve is normal in structure. Trivial mitral valve regurgitation. No evidence of mitral stenosis.  4. The aortic valve is tricuspid. Aortic valve regurgitation is not visualized. Aortic valve sclerosis/calcification is present, without any evidence of aortic stenosis.  5. Aortic dilatation noted. There is mild dilatation of the ascending aorta, measuring 38 mm.  6. The inferior vena cava is dilated in size with >50% respiratory variability, suggesting right atrial pressure of 8 mmHg. FINDINGS  Left Ventricle: Left ventricular ejection fraction, by estimation, is 60 to 65%. The left ventricle has normal function. The left ventricle has no regional  wall motion abnormalities. The left ventricular internal cavity size was normal in size. There is  moderate asymmetric left ventricular hypertrophy of the basal-septal segment. Left ventricular diastolic parameters are indeterminate. Right Ventricle: The right ventricular size is normal. Right vetricular wall thickness was not well visualized. Right ventricular systolic function is normal. Tricuspid regurgitation signal is inadequate for  assessing PA pressure. Left Atrium: Left atrial size was normal in size. Right Atrium: Right atrial size was normal in size. Pericardium: There is no evidence of pericardial effusion. Mitral Valve: The mitral valve is normal in structure. Trivial mitral valve regurgitation. No evidence of mitral valve stenosis. MV peak gradient, 3.9 mmHg. The mean mitral valve gradient is 1.0 mmHg. Tricuspid Valve: The tricuspid valve is normal in structure. Tricuspid valve regurgitation is trivial. Aortic Valve: The aortic valve is tricuspid. Aortic valve regurgitation is not visualized. Aortic valve sclerosis/calcification is present, without any evidence of aortic stenosis. Pulmonic Valve: The pulmonic valve was not well visualized. Pulmonic valve regurgitation is trivial. Aorta: The aortic root is normal in size and structure and aortic dilatation noted. There is mild dilatation of the ascending aorta, measuring 38 mm. Venous: The inferior vena cava is dilated in size with greater than 50% respiratory variability, suggesting right atrial pressure of 8 mmHg. IAS/Shunts: No atrial level shunt detected by color flow Doppler.  LEFT VENTRICLE PLAX 2D LVIDd:         4.25 cm      Diastology LVIDs:         3.00 cm      LV e' medial:    6.18 cm/s LV PW:         1.10 cm      LV E/e' medial:  12.5 LV IVS:        1.35 cm      LV e' lateral:   8.59 cm/s LVOT diam:     2.20 cm      LV E/e' lateral: 9.0 LVOT Area:     3.80 cm  LV Volumes (MOD) LV vol d, MOD A2C: 123.0 ml LV vol d, MOD A4C: 80.3 ml LV vol s, MOD A2C: 40.6 ml LV vol s, MOD A4C: 26.8 ml LV SV MOD A2C:     82.4 ml LV SV MOD A4C:     80.3 ml LV SV MOD BP:      67.4 ml RIGHT VENTRICLE             IVC RV S prime:     10.10 cm/s  IVC diam: 2.10 cm TAPSE (M-mode): 1.0 cm LEFT ATRIUM             Index        RIGHT ATRIUM          Index LA diam:        4.10 cm 2.01 cm/m   RA Area:     9.32 cm LA Vol (A2C):   64.5 ml 31.61 ml/m  RA Volume:   15.50 ml 7.60 ml/m LA Vol (A4C):   51.6 ml  25.29 ml/m LA Biplane Vol: 60.7 ml 29.75 ml/m   AORTA Ao Root diam: 3.90 cm Ao Asc diam:  3.80 cm MITRAL VALVE MV Peak grad: 3.9 mmHg     SHUNTS MV Mean grad: 1.0 mmHg     Systemic Diam: 2.20 cm MV Vmax:      0.98 m/s MV Vmean:     50.3 cm/s MR Peak grad: 28.5 mmHg MR Vmax:  267.00 cm/s MV E velocity: 77.50 cm/s Oswaldo Milian MD Electronically signed by Oswaldo Milian MD Signature Date/Time: 01/04/2022/11:40:35 AM    Final    US RENAL  Result Date: 01/04/2022 CLINICAL DATA:  Hematuria EXAM: RENAL / URINARY TRACT ULTRASOUND COMPLETE COMPARISON:  07/18/2021, plain film from 01/03/2022 FINDINGS: Right Kidney: Renal measurements: 12.6 x 5.9 x 5.6 cm. = volume: 217.4 mL. Echogenicity within normal limits. No mass or hydronephrosis visualized. Left Kidney: Renal measurements: 13 x 7.3 x 5.1 cm. = volume: 252.3 mL. Echogenicity within normal limits. No mass or hydronephrosis visualized. Bladder: Bladder is well distended. Some dependent debris is noted. Adjacent to the bladder is the reservoir for the patient's penile implant system. Other: Large bilateral pleural effusions are noted. IMPRESSION: Normal appearing kidneys bilaterally. Bladder is well distended with some dependent debris. Correlate with laboratory values. Large bilateral pleural effusions are noted increased from prior plain film. Electronically Signed   By: Inez Catalina M.D.   On: 01/04/2022 03:20   MR BRAIN W WO CONTRAST  Result Date: 01/04/2022 CLINICAL DATA:  Psychosis EXAM: MRI HEAD WITHOUT AND WITH CONTRAST TECHNIQUE: Multiplanar, multiecho pulse sequences of the brain and surrounding structures were obtained without and with intravenous contrast. CONTRAST:  59m GADAVIST GADOBUTROL 1 MMOL/ML IV SOLN COMPARISON:  None Available. FINDINGS: Brain: No acute infarction, hemorrhage, hydrocephalus, extra-axial collection or mass lesion. Single chronic microhemorrhage in the left cerebellum. Midline structures are normal. Mild  periventricular white matter hyperintensity. 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 Mild volume loss. No abnormal contrast enhancement. Vascular: Normal flow voids Skull and upper cervical spine: Normal bone marrow signal. Sinuses/Orbits: Paranasal sinuses and mastoids are clear. Normal orbits. Other: Nasopharynx is clear. IMPRESSION: 1. No acute intracranial abnormality. 2. Chronic small vessel disease Electronically Signed   By: KUlyses JarredM.D.   On: 01/04/2022 03:19   DG CHEST PORT 1 VIEW  Result Date: 01/03/2022 CLINICAL DATA:  Bradycardia. EXAM: PORTABLE CHEST 1 VIEW COMPARISON:  Chest radiograph 07/17/2021 FINDINGS: Prior median sternotomy. Mild cardiomegaly. Aortic atherosclerosis. There are small bilateral pleural effusions, tracking laterally on the right. Vertical lucency in the right lower lung zone is likely an overlying skin fold. No evidence of pneumothorax. Mild peribronchial thickening. IMPRESSION: 1. Mild cardiomegaly with small bilateral pleural effusions. 2. Mild peribronchial thickening, can be seen with bronchitis or pulmonary edema. Electronically Signed   By: MKeith RakeM.D.   On: 01/03/2022 23:24   CT Head Wo Contrast  Result Date: 01/03/2022 CLINICAL DATA:  Hallucinations EXAM: CT HEAD WITHOUT CONTRAST TECHNIQUE: Contiguous axial images were obtained from the base of the skull through the vertex without intravenous contrast. RADIATION DOSE REDUCTION: This exam was performed according to the departmental dose-optimization program which includes automated exposure control, adjustment of the mA and/or kV according to patient size and/or use of iterative reconstruction technique. COMPARISON:  05/16/2020 FINDINGS: Brain: No acute intracranial findings are seen. There are no signs of bleeding within the cranium. Cortical sulci are prominent. There is decreased density in periventricular white matter. Vascular: Scattered arterial calcifications are seen. Skull:  Unremarkable. Sinuses/Orbits: Unremarkable. Other: None. IMPRESSION: No acute intracranial findings are seen in noncontrast CT brain. Atrophy. Small-vessel disease. Electronically Signed   By: PElmer PickerM.D.   On: 01/03/2022 12:54        Scheduled Meds:  amLODipine  10 mg Oral QHS   apixaban  5 mg Oral BID   aspirin EC  81 mg Oral Daily   atorvastatin  40 mg Oral Daily   furosemide  40 mg Intravenous Q12H   insulin aspart  0-9 Units Subcutaneous Q4H   [START ON 01/06/2022] losartan  50 mg Oral Daily   mirtazapine  15 mg Oral QHS   PARoxetine  20 mg Oral Daily   sodium chloride flush  3 mL Intravenous Q12H   tamsulosin  0.4 mg Oral QPC supper   thiamine  100 mg Oral Daily   Continuous Infusions:  sodium chloride     cefTRIAXone (ROCEPHIN)  IV Stopped (01/04/22 1451)   magnesium sulfate bolus IVPB Stopped (01/04/22 0436)     LOS: 0 days    Time spent: 45 minutes spent on chart review, discussion with nursing staff, consultants, updating family and interview/physical exam; more than 50% of that time was spent in counseling and/or coordination of care.    Geradine Girt, DO [facility] Available via Epic secure chat 7am-7pm After these hours, please refer to coverage provider listed on [email or web address] 01/05/2022, 11:33 AM

## 2022-01-05 NOTE — Progress Notes (Signed)
Progress Note  Patient Name: [name] [name] Date of Encounter: 01/05/2022  Primary Cardiologist:   Evalina Field, MD   Subjective   No acute distress.  No pain  Inpatient Medications    Scheduled Meds:  amLODipine  10 mg Oral QHS   apixaban  5 mg Oral BID   aspirin EC  81 mg Oral Daily   atorvastatin  40 mg Oral Daily   furosemide  40 mg Intravenous Q12H   insulin aspart  0-9 Units Subcutaneous Q4H   losartan  25 mg Oral Daily   mirtazapine  15 mg Oral QHS   PARoxetine  20 mg Oral Daily   sodium chloride flush  3 mL Intravenous Q12H   tamsulosin  0.4 mg Oral QPC supper   thiamine  100 mg Oral Daily   Continuous Infusions:  sodium chloride     cefTRIAXone (ROCEPHIN)  IV Stopped (01/04/22 1451)   magnesium sulfate bolus IVPB Stopped (01/04/22 0436)   PRN Meds: sodium chloride, acetaminophen **OR** acetaminophen, HYDROcodone-acetaminophen, mouth rinse, sodium chloride flush   Vital Signs    Vitals:   01/04/22 2226 01/05/22 0006 01/05/22 0440 01/05/22 0727  BP: (!) 183/87 (!) 177/81 (!) 177/80 133/81  Pulse:  (!) 51 61 (!) 49  Resp:  '19 19 20  '$ Temp:  (!) 97.1 F (36.2 C) 98.4 F (36.9 C) 98.3 F (36.8 C)  TempSrc:  Oral Oral Oral  SpO2:  93% 94% 95%  Weight:  80.8 kg    Height:        Intake/Output Summary (Last 24 hours) at 01/05/2022 0928 Last data filed at 01/05/2022 0855 Gross per 24 hour  Intake 630 ml  Output 500 ml  Net 130 ml   Filed Weights   01/03/22 2200 01/04/22 0002 01/05/22 0006  Weight: 78.1 kg 78.2 kg 80.8 kg    Telemetry    Atrial fib with slow ventricular rate but no sustained pauses - Personally Reviewed  ECG    NA - Personally Reviewed  Physical Exam   GEN: No acute distress.   Neck: No  JVD Cardiac: Irregular RR, no murmurs, rubs, or gallops.  Respiratory: Clear  to auscultation bilaterally. GI: Soft, nontender, non-distended  MS: Mild ankle  edema; No deformity. Neuro:  Nonfocal  Psych: Normal affect   Labs     Chemistry Recent Labs  Lab 01/03/22 1235 01/03/22 1910 01/04/22 0400 01/05/22 0758  NA 134* 137 131* 135  K 3.5 3.5 3.0* 3.7  CL 95*  --  95* 97*  CO2 32  --  30 30  GLUCOSE 302*  --  119* 148*  BUN 14  --  12 14  CREATININE 0.76  --  0.65 0.91  CALCIUM 8.8*  --  7.9* 8.2*  PROT 6.6  --  5.3*  --   ALBUMIN 3.3*  --  2.4*  --   AST 16  --  17  --   ALT 7  --  9  --   ALKPHOS 93  --  79  --   BILITOT 0.7  --  0.8  --   GFRNONAA >60  --  >60 >60  ANIONGAP 7  --  6 8     Hematology Recent Labs  Lab 01/03/22 1235 01/03/22 1910 01/04/22 0400 01/05/22 0758  WBC 5.0  --  5.1 6.3  RBC 4.37  --  4.04*  4.02* 4.57  HGB 12.6* 12.9* 11.6* 13.2  HCT 38.7* 38.0* 35.7* 40.2  MCV 88.6  --  88.4 88.0  MCH 28.8  --  28.7 28.9  MCHC 32.6  --  32.5 32.8  RDW 15.7*  --  15.4 15.8*  PLT 176  --  171 195    Cardiac EnzymesNo results for input(s): "TROPONINI" in the last 168 hours. No results for input(s): "TROPIPOC" in the last 168 hours.   BNPNo results for input(s): "BNP", "PROBNP" in the last 168 hours.   DDimer No results for input(s): "DDIMER" in the last 168 hours.   Radiology    EEG adult  Result Date: 01/04/2022 Lora Havens, MD     01/04/2022 12:47 PM Patient Name: Manolo Bosket MRN: [id number] Epilepsy Attending: Lora Havens Referring Physician/Provider: Toy Baker, MD Date: 01/04/2022 Duration: 25.59 mins Patient history: 67 year old male with altered mental status.  EEG to evaluate for seizure. Level of alertness: Awake AEDs during EEG study: Xanax Technical aspects: This EEG study was done with scalp electrodes positioned according to the 10-20 International system of electrode placement. Electrical activity was reviewed with band pass filter of 1-'70Hz'$ , sensitivity of 7 uV/mm, display speed of 40m/sec with a '60Hz'$  notched filter applied as appropriate. EEG data were recorded continuously and digitally stored.  Video monitoring was available and  reviewed as appropriate. Description: The posterior dominant rhythm consists of 9-10 Hz activity of moderate voltage (25-35 uV) seen predominantly in posterior head regions, symmetric and reactive to eye opening and eye closing. Sleep was characterized by vertex waves, sleep spindles (12 to 14 Hz), maximal frontocentral region. There is an excessive amount of 15 to 18 Hz beta activity distributed symmetrically and diffusely.  Hyperventilation and photic stimulation were not performed.   ABNORMALITY - Excessive beta, generalized IMPRESSION: This study is within normal limits. The excessive beta activity seen in the background is most likely due to the effect of benzodiazepine and is a benign EEG pattern. No seizures or epileptiform discharges were seen throughout the recording. PLora Havens  ECHOCARDIOGRAM COMPLETE  Result Date: 01/04/2022    ECHOCARDIOGRAM REPORT   Patient Name:   JKEEFER SOULLIEREDate of Exam: 01/04/2022 Medical Rec #:  [id number]           Height:       74.0 in Accession #:    [id number]          Weight:       172.4 lb Date of Birth:  [birth year]            BSA:          2.041 m Patient Age:    89years             BP:           185/93 mmHg Patient Gender: M                    HR:           50 bpm. Exam Location:  Inpatient Procedure: 2D Echo, Cardiac Doppler and Color Doppler Indications:    Abnormal ECG  History:        Patient has prior history of Echocardiogram examinations, most                 recent 02/15/2021. CHF, CAD, Prior CABG, PAD, Arrythmias:Atrial                 Fibrillation; Risk Factors:Diabetes, Hypertension and  Dyslipidemia.  Sonographer:    Eartha Inch Referring Phys: [id number] ANASTASSIA DOUTOVA  Sonographer Comments: Technically challenging study due to limited acoustic windows. Image acquisition challenging due to patient body habitus and Image acquisition challenging due to respiratory motion. IMPRESSIONS  1. Left ventricular ejection fraction, by  estimation, is 60 to 65%. The left ventricle has normal function. The left ventricle has no regional wall motion abnormalities. There is moderate asymmetric left ventricular hypertrophy of the basal-septal segment. Left ventricular diastolic parameters are indeterminate.  2. Right ventricular systolic function is normal. The right ventricular size is normal. Tricuspid regurgitation signal is inadequate for assessing PA pressure.  3. The mitral valve is normal in structure. Trivial mitral valve regurgitation. No evidence of mitral stenosis.  4. The aortic valve is tricuspid. Aortic valve regurgitation is not visualized. Aortic valve sclerosis/calcification is present, without any evidence of aortic stenosis.  5. Aortic dilatation noted. There is mild dilatation of the ascending aorta, measuring 38 mm.  6. The inferior vena cava is dilated in size with >50% respiratory variability, suggesting right atrial pressure of 8 mmHg. FINDINGS  Left Ventricle: Left ventricular ejection fraction, by estimation, is 60 to 65%. The left ventricle has normal function. The left ventricle has no regional wall motion abnormalities. The left ventricular internal cavity size was normal in size. There is  moderate asymmetric left ventricular hypertrophy of the basal-septal segment. Left ventricular diastolic parameters are indeterminate. Right Ventricle: The right ventricular size is normal. Right vetricular wall thickness was not well visualized. Right ventricular systolic function is normal. Tricuspid regurgitation signal is inadequate for assessing PA pressure. Left Atrium: Left atrial size was normal in size. Right Atrium: Right atrial size was normal in size. Pericardium: There is no evidence of pericardial effusion. Mitral Valve: The mitral valve is normal in structure. Trivial mitral valve regurgitation. No evidence of mitral valve stenosis. MV peak gradient, 3.9 mmHg. The mean mitral valve gradient is 1.0 mmHg. Tricuspid Valve:  The tricuspid valve is normal in structure. Tricuspid valve regurgitation is trivial. Aortic Valve: The aortic valve is tricuspid. Aortic valve regurgitation is not visualized. Aortic valve sclerosis/calcification is present, without any evidence of aortic stenosis. Pulmonic Valve: The pulmonic valve was not well visualized. Pulmonic valve regurgitation is trivial. Aorta: The aortic root is normal in size and structure and aortic dilatation noted. There is mild dilatation of the ascending aorta, measuring 38 mm. Venous: The inferior vena cava is dilated in size with greater than 50% respiratory variability, suggesting right atrial pressure of 8 mmHg. IAS/Shunts: No atrial level shunt detected by color flow Doppler.  LEFT VENTRICLE PLAX 2D LVIDd:         4.25 cm      Diastology LVIDs:         3.00 cm      LV e' medial:    6.18 cm/s LV PW:         1.10 cm      LV E/e' medial:  12.5 LV IVS:        1.35 cm      LV e' lateral:   8.59 cm/s LVOT diam:     2.20 cm      LV E/e' lateral: 9.0 LVOT Area:     3.80 cm  LV Volumes (MOD) LV vol d, MOD A2C: 123.0 ml LV vol d, MOD A4C: 80.3 ml LV vol s, MOD A2C: 40.6 ml LV vol s, MOD A4C: 26.8 ml LV SV MOD A2C:  82.4 ml LV SV MOD A4C:     80.3 ml LV SV MOD BP:      67.4 ml RIGHT VENTRICLE             IVC RV S prime:     10.10 cm/s  IVC diam: 2.10 cm TAPSE (M-mode): 1.0 cm LEFT ATRIUM             Index        RIGHT ATRIUM          Index LA diam:        4.10 cm 2.01 cm/m   RA Area:     9.32 cm LA Vol (A2C):   64.5 ml 31.61 ml/m  RA Volume:   15.50 ml 7.60 ml/m LA Vol (A4C):   51.6 ml 25.29 ml/m LA Biplane Vol: 60.7 ml 29.75 ml/m   AORTA Ao Root diam: 3.90 cm Ao Asc diam:  3.80 cm MITRAL VALVE MV Peak grad: 3.9 mmHg     SHUNTS MV Mean grad: 1.0 mmHg     Systemic Diam: 2.20 cm MV Vmax:      0.98 m/s MV Vmean:     50.3 cm/s MR Peak grad: 28.5 mmHg MR Vmax:      267.00 cm/s MV E velocity: 77.50 cm/s Oswaldo Milian MD Electronically signed by Oswaldo Milian MD  Signature Date/Time: 01/04/2022/11:40:35 AM    Final    US RENAL  Result Date: 01/04/2022 CLINICAL DATA:  Hematuria EXAM: RENAL / URINARY TRACT ULTRASOUND COMPLETE COMPARISON:  07/18/2021, plain film from 01/03/2022 FINDINGS: Right Kidney: Renal measurements: 12.6 x 5.9 x 5.6 cm. = volume: 217.4 mL. Echogenicity within normal limits. No mass or hydronephrosis visualized. Left Kidney: Renal measurements: 13 x 7.3 x 5.1 cm. = volume: 252.3 mL. Echogenicity within normal limits. No mass or hydronephrosis visualized. Bladder: Bladder is well distended. Some dependent debris is noted. Adjacent to the bladder is the reservoir for the patient's penile implant system. Other: Large bilateral pleural effusions are noted. IMPRESSION: Normal appearing kidneys bilaterally. Bladder is well distended with some dependent debris. Correlate with laboratory values. Large bilateral pleural effusions are noted increased from prior plain film. Electronically Signed   By: Inez Catalina M.D.   On: 01/04/2022 03:20   MR BRAIN W WO CONTRAST  Result Date: 01/04/2022 CLINICAL DATA:  Psychosis EXAM: MRI HEAD WITHOUT AND WITH CONTRAST TECHNIQUE: Multiplanar, multiecho pulse sequences of the brain and surrounding structures were obtained without and with intravenous contrast. CONTRAST:  47m GADAVIST GADOBUTROL 1 MMOL/ML IV SOLN COMPARISON:  None Available. FINDINGS: Brain: No acute infarction, hemorrhage, hydrocephalus, extra-axial collection or mass lesion. Single chronic microhemorrhage in the left cerebellum. Midline structures are normal. Mild periventricular white matter hyperintensity. 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 Mild volume loss. No abnormal contrast enhancement. Vascular: Normal flow voids Skull and upper cervical spine: Normal bone marrow signal. Sinuses/Orbits: Paranasal sinuses and mastoids are clear. Normal orbits. Other: Nasopharynx is clear. IMPRESSION: 1. No acute intracranial abnormality. 2.  Chronic small vessel disease Electronically Signed   By: KUlyses JarredM.D.   On: 01/04/2022 03:19   DG CHEST PORT 1 VIEW  Result Date: 01/03/2022 CLINICAL DATA:  Bradycardia. EXAM: PORTABLE CHEST 1 VIEW COMPARISON:  Chest radiograph 07/17/2021 FINDINGS: Prior median sternotomy. Mild cardiomegaly. Aortic atherosclerosis. There are small bilateral pleural effusions, tracking laterally on the right. Vertical lucency in the right lower lung zone is likely an overlying skin fold. No evidence of pneumothorax. Mild peribronchial thickening.  IMPRESSION: 1. Mild cardiomegaly with small bilateral pleural effusions. 2. Mild peribronchial thickening, can be seen with bronchitis or pulmonary edema. Electronically Signed   By: Keith Rake M.D.   On: 01/03/2022 23:24   CT Head Wo Contrast  Result Date: 01/03/2022 CLINICAL DATA:  Hallucinations EXAM: CT HEAD WITHOUT CONTRAST TECHNIQUE: Contiguous axial images were obtained from the base of the skull through the vertex without intravenous contrast. RADIATION DOSE REDUCTION: This exam was performed according to the departmental dose-optimization program which includes automated exposure control, adjustment of the mA and/or kV according to patient size and/or use of iterative reconstruction technique. COMPARISON:  05/16/2020 FINDINGS: Brain: No acute intracranial findings are seen. There are no signs of bleeding within the cranium. Cortical sulci are prominent. There is decreased density in periventricular white matter. Vascular: Scattered arterial calcifications are seen. Skull: Unremarkable. Sinuses/Orbits: Unremarkable. Other: None. IMPRESSION: No acute intracranial findings are seen in noncontrast CT brain. Atrophy. Small-vessel disease. Electronically Signed   By: Elmer Picker M.D.   On: 01/03/2022 12:54    Cardiac Studies    Echo 12/05/22  1. Left ventricular ejection fraction, by estimation, is 60 to 65%. The  left ventricle has normal function. The  left ventricle has no regional  wall motion abnormalities. There is moderate asymmetric left ventricular  hypertrophy of the basal-septal  segment. Left ventricular diastolic parameters are indeterminate.   2. Right ventricular systolic function is normal. The right ventricular  size is normal. Tricuspid regurgitation signal is inadequate for assessing  PA pressure.   3. The mitral valve is normal in structure. Trivial mitral valve  regurgitation. No evidence of mitral stenosis.   4. The aortic valve is tricuspid. Aortic valve regurgitation is not  visualized. Aortic valve sclerosis/calcification is present, without any  evidence of aortic stenosis.   5. Aortic dilatation noted. There is mild dilatation of the ascending  aorta, measuring 38 mm.   6. The inferior vena cava is dilated in size with >50% respiratory  variability, suggesting right atrial pressure of 8 mmHg.    Patient Profile     67 y.o. male  with a hx of CAD s/p 4v CABG (LIMA-LAD, RIMA-distal RCA, left radial-left circumflex artery, reverse SVG to D1 ) 09/30/2001 by Dr. Servando Snare, PAD, HTN, HLD, DM II on chronic insulin therapy, PAF and HFpEF who is being seen 01/04/2022 for the evaluation of bradycardia at the request of Dr. Eliseo Squires.   Assessment & Plan    Dizziness:  No overnight events.  Two week monitor in place.  No change in meds.  No indication for pacing at this point. No change in therapy.    Atrial fib:  Continue anticoagulation.    HTN:  I did take the liberty of increasing his Cozaar as his BPs have been up.   HTN:  BP is controlled.   For questions or updates, please contact [facility] Please consult [email or web address] for contact info under Cardiology/STEMI.   Signed, Minus Breeding, MD  01/05/2022, 9:28 AM

## 2022-01-05 NOTE — Telephone Encounter (Signed)
Sam with [employer] is calling to report abnormal Zio monitor results.  PhoneA#: [phone] Reference#: [id number]

## 2022-01-05 NOTE — Progress Notes (Signed)
Physical Therapy Treatment Patient Details Name: [name] [name] MRN: [id number] DOB: 1955/03/16 Today's Date: 01/05/2022   History of Present Illness Pt is a 67 y.o. male who presented 01/03/21 with hallucinations. Noted to be bradycardic. PMH: A-fib on Eliquis, CAD status post CABG, PAD, diastolic CHF, DM2 on insulin pump, HTN, HLD, depression    PT Comments    Pt making gradual progress but still with deficits in cognition/memory and dynamic balance leading to decreased safety.  He ambulated in hallway with rollator and supervision but does have decreased stability during ADLs when UE not supported.  Pt's HR was 57 BPM rest and 68 BPM walking. Continue to work on balance, safety, and cardiopulmonary endurance.     Recommendations for follow up therapy are one component of a multi-disciplinary discharge planning process, led by the attending physician.  Recommendations may be updated based on patient status, additional functional criteria and insurance authorization.  Follow Up Recommendations  Home health PT     Assistance Recommended at Discharge Intermittent Supervision/Assistance  Patient can return home with the following A little help with walking and/or transfers;A little help with bathing/dressing/bathroom;Assistance with cooking/housework;Assist for transportation   Equipment Recommendations  None recommended by PT    Recommendations for Other Services       Precautions / Restrictions Precautions Precautions: Fall;Other (comment) Precaution Comments: watch BP and HR     Mobility  Bed Mobility Overal bed mobility: Needs Assistance Bed Mobility: Supine to Sit, Sit to Supine     Supine to sit: Supervision Sit to supine: Supervision        Transfers Overall transfer level: Needs assistance Equipment used: Rollator (4 wheels) Transfers: Sit to/from Stand Sit to Stand: Supervision, Min assist           General transfer comment: Supervision from bed but min  A from low toilet; required cues and some physical assist for ADLs (physical assist more due to lines/leads)    Ambulation/Gait Ambulation/Gait assistance: Supervision Gait Distance (Feet): 130 Feet Assistive device: Rollator (4 wheels) Gait Pattern/deviations: Step-through pattern Gait velocity: normal     General Gait Details: Min cues for rollator around obstacles.  Close supervision for safety.  Distance limited due to bowel urgency   Stairs             Wheelchair Mobility    Modified Rankin (Stroke Patients Only)       Balance Overall balance assessment: Needs assistance Sitting-balance support: No upper extremity supported, Feet supported Sitting balance-Leahy Scale: Good     Standing balance support: Bilateral upper extremity supported, No upper extremity supported Standing balance-Leahy Scale: Fair Standing balance comment: Rollator for ambulation but pt standing at sink to wash hands without support; however, does not handle challenges well (therapist adjust pt gown while standing without UE support and pt had loss of balance with minimal pertubation)                            Cognition Arousal/Alertness: Awake/alert Behavior During Therapy: WFL for tasks assessed/performed Overall Cognitive Status: No family/caregiver present to determine baseline cognitive functioning Area of Impairment: Attention, Memory, Awareness, Problem solving                   Current Attention Level: Selective Memory: Decreased short-term memory     Awareness: Emergent Problem Solving: Slow processing, Requires verbal cues General Comments: Pt repeated self during session, when returned to room "is this my room?" ,  cues for initiation and safety        Exercises      General Comments General comments (skin integrity, edema, etc.): HR 57 bpm rest and 68 bpm walking      Pertinent Vitals/Pain Pain Assessment Pain Assessment: No/denies pain     Home Living                          Prior Function            PT Goals (current goals can now be found in the care plan section) Progress towards PT goals: Progressing toward goals    Frequency    Min 3X/week      PT Plan Current plan remains appropriate    Co-evaluation              AM-PAC PT "6 Clicks" Mobility   Outcome Measure  Help needed turning from your back to your side while in a flat bed without using bedrails?: A Little Help needed moving from lying on your back to sitting on the side of a flat bed without using bedrails?: A Little Help needed moving to and from a bed to a chair (including a wheelchair)?: A Little Help needed standing up from a chair using your arms (e.g., wheelchair or bedside chair)?: A Little Help needed to walk in hospital room?: A Little Help needed climbing 3-5 steps with a railing? : A Little 6 Click Score: 18    End of Session Equipment Utilized During Treatment: Gait belt Activity Tolerance: Patient tolerated treatment well Patient left: with call bell/phone within reach;in bed;with bed alarm set Nurse Communication: Mobility status PT Visit Diagnosis: Unsteadiness on feet (R26.81);Other abnormalities of gait and mobility (R26.89);Muscle weakness (generalized) (M62.81);History of falling (Z91.81);Difficulty in walking, not elsewhere classified (R26.2)     Time: 1751-0258 PT Time Calculation (min) (ACUTE ONLY): 26 min  Charges:  $Gait Training: 8-22 mins $Therapeutic Activity: 8-22 mins                     Abran Richard, PT Acute Rehab [facility] [phone]    Karlton Lemon 01/05/2022, 4:16 PM

## 2022-01-05 NOTE — Progress Notes (Signed)
Occupational Therapy Treatment Patient Details Name: [name] [name] MRN: [id number] DOB: 04/30/1955 Today's Date: 01/05/2022   History of present illness Pt is a 67 y.o. male who presented 01/03/21 with hallucinations. Noted to be bradycardic. PMH: A-fib on Eliquis, CAD status post CABG, PAD, diastolic CHF, DM2 on insulin pump, HTN, HLD, depression   OT comments  Pt. Seen for skilled OT session.  Focus of session was further cognitive assessment.  Administered SBT for pt.  Pt. Score is 8 with results stating: questionable impairment (evaluate for early dementing disorder).   See other notes below in cognition section related to pill/medication management along with money/bill management.  Per this assessment further diagnostics recommended for pt. Safety.  Agree with current recommendations for frequent/constant S/assistance.     Recommendations for follow up therapy are one component of a multi-disciplinary discharge planning process, led by the attending physician.  Recommendations may be updated based on patient status, additional functional criteria and insurance authorization.    Follow Up Recommendations  Home health OT     Assistance Recommended at Discharge Frequent or constant Supervision/Assistance  Patient can return home with the following  A little help with walking and/or transfers;A lot of help with bathing/dressing/bathroom;Assistance with cooking/housework;Direct supervision/assist for medications management;Direct supervision/assist for financial management;Assist for transportation;Help with stairs or ramp for entrance   Equipment Recommendations  None recommended by OT    Recommendations for Other Services      Precautions / Restrictions Precautions Precautions: Fall;Other (comment) Precaution Comments: watch BP and HR       Mobility Bed Mobility                    Transfers                         Balance                                            ADL either performed or assessed with clinical judgement   ADL                                         General ADL Comments: focus of session was administering of SBT    Extremity/Trunk Assessment              Vision       Perception     Praxis      Cognition Arousal/Alertness: Awake/alert Behavior During Therapy: WFL for tasks assessed/performed Overall Cognitive Status: Impaired/Different from baseline                           Safety/Judgement: Decreased awareness of deficits, Decreased awareness of safety Awareness: Emergent   General Comments: SBT performed, pt. score is 8 by definition (Questionable impairment (evaluate for early dementing disorder). also when asked why medication was managed ie: had there been any issues in the past. pt. shared a story that his 4 yr old mother had messed up his insulin and he got very sick so his son took it over.  he further explained she was only helping him with it because she wanted to not for a "need".  when asked about bills and money management he states that  there "is no real reason his son does it just that he knows where to send it"  denied any reason for these intervetions/assistance as a need in response to any previous issues.  Pt. Also telling MD that was present that he looked like pts. Best friend and he had confused the MD for his friend yesterday and had called his friend last night to ask him "how did you know all of this medical stuff about me". States his friend told him it was not him that had been there yesterday and then he realized it was the MD not his friend.          Exercises      Shoulder Instructions       General Comments      Pertinent Vitals/ Pain          Home Living                                          Prior Functioning/Environment              Frequency  Min 2X/week        Progress Toward  Goals  OT Goals(current goals can now be found in the care plan section)  Progress towards OT goals: Progressing toward goals     Plan Discharge plan remains appropriate    Co-evaluation                 AM-PAC OT "6 Clicks" Daily Activity     Outcome Measure   Help from another person eating meals?: None Help from another person taking care of personal grooming?: A Little Help from another person toileting, which includes using toliet, bedpan, or urinal?: A Lot Help from another person bathing (including washing, rinsing, drying)?: A Lot Help from another person to put on and taking off regular upper body clothing?: A Little Help from another person to put on and taking off regular lower body clothing?: A Lot 6 Click Score: 16    End of Session    OT Visit Diagnosis: Unsteadiness on feet (R26.81);Other abnormalities of gait and mobility (R26.89);History of falling (Z91.81);Muscle weakness (generalized) (M62.81);Other symptoms and signs involving cognitive function   Activity Tolerance Patient tolerated treatment well   Patient Left in bed;with call bell/phone within reach;with bed alarm set;Other (comment) (all 4 rails were up upon my arrival)   Nurse Communication          Time: [phone] OT Time Calculation (min): 16 min  Charges: OT General Charges $OT Visit: 1 Visit OT Treatments $Self Care/Home Management : 8-22 mins  Sonia Baller, COTA/L Acute Rehabilitation [phone]   Clearnce Sorrel Lorraine-COTA/L 01/05/2022, 10:20 AM

## 2022-01-05 NOTE — Inpatient Diabetes Management (Addendum)
Inpatient Diabetes Program Recommendations  AACE/ADA: New Consensus Statement on Inpatient Glycemic Control (2015)  Target Ranges:  Prepandial:   less than 140 mg/dL      Peak postprandial:   less than 180 mg/dL (1-2 hours)      Critically ill patients:  140 - 180 mg/dL   Lab Results  Component Value Date   GLUCAP 175 (H) 01/05/2022   HGBA1C 7.8 (H) 10/23/2021    Review of Glycemic Control  Latest Reference Range & Units 01/05/22 00:09 01/05/22 04:42 01/05/22 07:25 01/05/22 10:28  Glucose-Capillary 70 - 99 mg/dL 148 (H) 152 (H) 144 (H) 175 (H)  (H): Data is abnormally high Diabetes history: Type 2 DM Outpatient Diabetes medications: insulin pump- Omnipod/Dexcom Current orders for Inpatient glycemic control: Novolog 0-9 units Q4H  Inpatient Diabetes Program Recommendations:    Spoke with patient regarding diabetes and home regimen for diabetes management. Is followed by Dr Kelton Pillar, outpatient endocrinology.  Patient has needed supplies when ready to apply insulin pump. Don't feel patient is ready to transition at this time due to intake.   Instead consider Levemir 6 units QD.  Basal insulin  0000-0000 0.7 units/hour Total daily basal insulin: 16.8 units/24 hours Carb Coverage 1:1 1 unit for every 1 grams of carbohydrates Insulin Sensitivity 1:60 1 unit drops blood glucose 60 mg/dl Target Glucose Goals 0000-0000 100-120 mg/dl   In talking with the patient he states that his blood glucose normally runs very good and his last A1C was 7.8%.  Explained Lantus duration and advised patient that if he is discharged and resumes his insulin pump, he will not want to resume his basal insulin until 24 hours after the Lantus is given today since it will still be active. Patient verbalized understanding of information discussed and states that he does not have any further questions related to diabetes at this time.  Thanks, Bronson Curb, MSN, RNC-OB Diabetes  Coordinator [phone] (8a-5p)

## 2022-01-05 NOTE — Telephone Encounter (Signed)
   Cardiac Monitor Alert  Date of alert:  01/05/2022   Patient Name: [name] [name]  DOB: 1955-10-18  MRN: [id number]   [place] Cardiologist: Evalina Field, MD  [facility] EP:  None    Monitor Information: Long Term Monitor-Live Telemetry [ZioAT]  Reason:  Syncope and Collapse  Ordering provider:  Almyra Deforest, PA-C   Alert Atrial Fibrillation/Flutter with SVR: 35-50bpm, average rate 39bpm happened yesterday (1/4) at 8:08pm. This is the 3rd alert for this rhythm.  The patient has a hx of Atrial Fibrillation/Flutter.    Anticoagulation medication as of 01/05/2022           apixaban (ELIQUIS) 5 MG TABS tablet Take 1 tablet (5 mg total) by mouth 2 (two) times daily. PLEASE SCHEDULE APPT WITH ALYSSA FOR FURTHER REFILLS [phone]       Next Cardiology Appointment   Date:  02/19/22  Provider:  Coletta Memos, NP  The patient could NOT be reached by telephone today.  Pt is currently admitted at [facility] [place] [address]  Continue to monitor.    Other: Strip shown to DOD (Dr. Gardiner Rhyme) and will be scanned to pt's chart.  Beatrix Fetters, RN  01/05/2022 9:47 AM

## 2022-01-06 DIAGNOSIS — R42 Dizziness and giddiness: Secondary | ICD-10-CM

## 2022-01-06 DIAGNOSIS — I25810 Atherosclerosis of coronary artery bypass graft(s) without angina pectoris: Secondary | ICD-10-CM | POA: Diagnosis not present

## 2022-01-06 DIAGNOSIS — R443 Hallucinations, unspecified: Secondary | ICD-10-CM | POA: Diagnosis not present

## 2022-01-06 DIAGNOSIS — I4891 Unspecified atrial fibrillation: Secondary | ICD-10-CM | POA: Diagnosis not present

## 2022-01-06 DIAGNOSIS — I10 Essential (primary) hypertension: Secondary | ICD-10-CM | POA: Diagnosis not present

## 2022-01-06 DIAGNOSIS — E43 Unspecified severe protein-calorie malnutrition: Secondary | ICD-10-CM | POA: Insufficient documentation

## 2022-01-06 DIAGNOSIS — I5032 Chronic diastolic (congestive) heart failure: Secondary | ICD-10-CM | POA: Diagnosis not present

## 2022-01-06 DIAGNOSIS — R338 Other retention of urine: Secondary | ICD-10-CM | POA: Diagnosis not present

## 2022-01-06 LAB — URINE CULTURE: Culture: >=100000 — AB

## 2022-01-06 LAB — GLUCOSE, CAPILLARY: Glucose-Capillary: 231 mg/dL — ABNORMAL HIGH (ref 70–99)

## 2022-01-06 LAB — BASIC METABOLIC PANEL
Anion gap: 5 (ref 5–15)
BUN: 17 mg/dL (ref 8–23)
CO2: 30 mmol/L (ref 22–32)
Calcium: 7.9 mg/dL — ABNORMAL LOW (ref 8.9–10.3)
Chloride: 98 mmol/L (ref 98–111)
Creatinine, Ser: 0.9 mg/dL (ref 0.61–1.24)
GFR, Estimated: >60 mL/min (ref >60)
Glucose, Bld: 252 mg/dL — ABNORMAL HIGH (ref 70–99)
Potassium: 3.5 mmol/L (ref 3.5–5.1)
Sodium: 133 mmol/L — ABNORMAL LOW (ref 135–145)

## 2022-01-06 LAB — CBC
HCT: 35.9 % — ABNORMAL LOW (ref 39.0–52.0)
Hemoglobin: 12 g/dL — ABNORMAL LOW (ref 13.0–17.0)
MCH: 28.8 pg (ref 26.0–34.0)
MCHC: 33.4 g/dL (ref 30.0–36.0)
MCV: 86.3 fL (ref 80.0–100.0)
Platelets: 175 10*3/uL (ref 150–400)
RBC: 4.16 MIL/uL — ABNORMAL LOW (ref 4.22–5.81)
RDW: 15.7 % — ABNORMAL HIGH (ref 11.5–15.5)
WBC: 5.9 10*3/uL (ref 4.0–10.5)
nRBC: 0 % (ref 0.0–0.2)

## 2022-01-06 MED ORDER — AMOXICILLIN 500 MG PO CAPS
500.0000 mg | ORAL_CAPSULE | Freq: Three times a day (TID) | ORAL | Status: DC
  Administered 2022-01-06: 500 mg via ORAL
  Filled 2022-01-06: qty 1

## 2022-01-06 MED ORDER — TAMSULOSIN HCL 0.4 MG PO CAPS: 0.4000 mg | ORAL_CAPSULE | Freq: Every day | ORAL | 0 refills | Status: AC

## 2022-01-06 MED ORDER — LOSARTAN POTASSIUM 50 MG PO TABS: 50.0000 mg | ORAL_TABLET | Freq: Every day | ORAL | 2 refills | Status: DC

## 2022-01-06 MED ORDER — AMOXICILLIN 500 MG PO CAPS: 500.0000 mg | ORAL_CAPSULE | Freq: Three times a day (TID) | ORAL | 0 refills | Status: AC

## 2022-01-06 MED ORDER — VANCOMYCIN HCL IN DEXTROSE 1-5 GM/200ML-% IV SOLN
1000.0000 mg | Freq: Two times a day (BID) | INTRAVENOUS | Status: DC
  Filled 2022-01-06: qty 200

## 2022-01-06 NOTE — TOC Transition Note (Signed)
Transition of Care [facility]) - CM/SW Discharge Note   Patient Details  Name: [name] [name] MRN: [id number] Date of Birth: Feb 14, 1955  [facility] CM/SW Contact:  Carles Collet, RN Phone Number: 01/06/2022, 9:21 AM   Clinical Narrative:     Patient will DC to home. Notified [place] today. No other TOC needs identified  Final next level of care: Home w Home Health Services Barriers to Discharge: Continued Medical Work up   Patient Goals and CMS Choice CMS Medicare.gov Compare Post Acute Care list provided to:: Patient Choice offered to / list presented to : Patient  Discharge Placement                         Discharge Plan and Services Additional resources added to the After Visit Summary for     Discharge Planning Services: CM Consult Post Acute Care Choice: Home Health          DME Arranged: N/A         HH Arranged: PT HH Agency: [place] Date [facility] Agency Contacted: 01/06/22 [place] Agency Contacted: [phone] Representative spoke with at [place]: [place] ([place] Interventions [place]: No Food Insecurity (01/03/2022)  Housing: Low Risk  (01/03/2022)  Transportation Needs: No Transportation Needs (01/03/2022)  Utilities: Not At Risk (01/03/2022)  Alcohol Screen: Low Risk  (10/23/2019)  Depression (PHQ2-9): Low Risk  (07/14/2020)  Financial Resource Strain: Unknown (10/23/2019)  Physical Activity: Inactive (10/23/2019)  Social Connections: Socially Isolated (10/23/2019)  Stress: Stress Concern Present (10/23/2019)  Tobacco Use: Medium Risk (01/04/2022)     Readmission Risk Interventions     No data to display

## 2022-01-06 NOTE — Discharge Summary (Signed)
Physician Discharge Summary   Patient: [name] [name] MRN: [id number] DOB: 28-Oct-1955  Admit date:     01/03/2022  Discharge date: 01/06/22  Discharge Physician: Cordelia Poche, MD   PCP: Fredirick Lathe, PA-C   Recommendations at discharge:  Hospital follow-up with PCP Cardiology follow-up for bradycardia  Discharge Diagnoses: Principal Problem:   Atrial fibrillation with slow ventricular response ([place]) Active Problems:   Uncontrolled type 2 diabetes mellitus with hyperglycemia (HCC)   Coronary artery disease   Hypertension   PAF (paroxysmal atrial fibrillation) (HCC)   Hallucinations   Hematuria   Acute urinary retention   Chronic diastolic CHF (congestive heart failure) (HCC)   UTI (urinary tract infection)   Protein-calorie malnutrition, severe  Resolved Problems:   * No resolved hospital problems. *  Hospital Course: [name] [name] is a 67 y.o. male with a hsitory of atrial fibrillation on Eliquis, CAD s/p CABG, PAD, diastolic heart failure, diabetes mellitus type 2 on insulin pump, hypertension, hyperlipidemia. Patient presented secondary to hallucinations and found to have evidence of bradycardia. MRI and EEG unrevealing for etiology of hallucinations. Symptoms improved with antibiotics for treatment of UTI. Cardiology consulted for bradycardia and recommended a Zio patch with outpatient cardiology follow-up.  Assessment and Plan:  Paroxysmal atrial fibrillation with slow ventricular response Cardiology consulted with recommendation for Zio patch. Asymptomatic. Patient ambulating without symptoms. Continue Eliquis. Zio patch applied prior to discharge. Outpatient cardiology follow-up.   Hallucinations Possibly related to acute infection. MRI and EEG without etiology for symptoms. Resolved prior to discharge.   UTI Patient started empirically on Ceftriaxone. Preliminary urine culture significant for enterococcus faecalis. Sensitivities allow for transition  to Amoxicillin on discharge.   Diabetes mellitus, type 2 Uncontrolled with hyperglycemia. Most recent hemoglobin A1C of 7.8% from 10/2021. Patient is managed on insulin pump as an outpatient. Resume insulin pump on discharge.   CAD Continue aspirin and Lipitor   Primary hypertension Patient is on losartan and spironolactone as an outpatient. Losartan dose increase per cardiology secondary to uncontrolled blood pressure.   Severe malnutrition Noted. Protein supplementation recommended.   Pressure injury Sacrum. Not documented if POA.   Consultants: Cardiology Procedures performed: Transthoracic Echocardiogram   Disposition: Home Diet recommendation: Carb modified diet   DISCHARGE MEDICATION: Allergies as of 01/06/2022       Reactions   Wool Alcohol [lanolin] Hives, Itching   Wool fabric        Medication List     TAKE these medications    acetaminophen 500 MG tablet Commonly known as: TYLENOL Take 500 mg by mouth as needed for mild pain or moderate pain.   amoxicillin 500 MG capsule Commonly known as: AMOXIL Take 1 capsule (500 mg total) by mouth every 8 (eight) hours for 5 days.   apixaban 5 MG Tabs tablet Commonly known as: Eliquis Take 1 tablet (5 mg total) by mouth 2 (two) times daily. PLEASE SCHEDULE APPT WITH ALYSSA FOR FURTHER REFILLS [phone] What changed: additional instructions   aspirin EC 81 MG tablet Take 1 tablet (81 mg total) by mouth daily. What changed: when to take this   atorvastatin 40 MG tablet Commonly known as: LIPITOR TAKE 1 TABLET BY MOUTH EVERY DAY What changed: when to take this   BD Pen Needle Nano 2nd Gen 32G X 4 MM Misc Generic drug: Insulin Pen Needle USE FOR INSULIN   Dexcom G6 Sensor Misc 1 Device by Does not apply route See admin instructions. Change every 10 days   Dexcom  G6 Transmitter Misc Use as instructed to check blood sugar   Disposable Brief Large Misc 1 each by Does not apply route as needed.    furosemide 40 MG tablet Commonly known as: LASIX TAKE 1 TABLET BY MOUTH EVERY DAY   lidocaine 5 % ointment Commonly known as: XYLOCAINE Apply 1 Application topically as needed.   losartan 50 MG tablet Commonly known as: COZAAR Take 1 tablet (50 mg total) by mouth daily. What changed:  medication strength how much to take   mirtazapine 15 MG tablet Commonly known as: REMERON TAKE 1 TABLET BY MOUTH EVERYDAY AT BEDTIME What changed: See the new instructions.   multivitamin with minerals tablet Take 1 tablet by mouth daily.   NovoLOG FlexPen 100 UNIT/ML FlexPen Generic drug: insulin aspart Inject 4 Units into the skin 2 (two) times daily. When pump is not attached. What changed: Another medication with the same name was changed. Make sure you understand how and when to take each.   NovoLOG 100 UNIT/ML injection Generic drug: insulin aspart INJECT 50 UNITS INTO THE SKIN SEE ADMIN INSTRUCTIONS. PT LOADS PUMP WITH 50 UNITS EVERY 3 DAYS What changed: See the new instructions.   Omnipod 5 G6 Intro (Gen 5) Kit 1 Device by Does not apply route every 3 (three) days.   PARoxetine 20 MG tablet Commonly known as: PAXIL TAKE 1 TABLET BY MOUTH EVERY DAY What changed: when to take this   spironolactone 25 MG tablet Commonly known as: ALDACTONE TAKE 1 TABLET BY MOUTH EVERY DAY What changed: when to take this   tamsulosin 0.4 MG Caps capsule Commonly known as: FLOMAX Take 1 capsule (0.4 mg total) by mouth daily after supper.   Voltaren 1 % Gel Generic drug: diclofenac Sodium Apply 1 Application topically as needed (pain).        Follow-up Information     Deberah Pelton, NP Follow up on 02/19/2022.   Specialty: Cardiology Why: 3:35PM. [facility] cardiology follow up Contact information: [address] [place] [address] [place], [facility] Follow up.   Specialty: [place] Why: Agency will call you to set up  apt times Contact information: [place] [address] [phone]         [name], [name], PA-C. Schedule an appointment as soon as possible for a visit in 1 week(s).   Specialty: Physician Assistant Why: For hospital follow-up Contact information: [place] [address] [phone]                Discharge Exam: BP 122/80 (BP Location: Right Arm)   Pulse (!) 102   Temp 97.7 F (36.5 C) (Oral)   Resp 20   Ht '6\' 2"'$  (1.88 m)   Wt 76.1 kg   SpO2 93%   BMI 21.54 kg/m   General exam: Appears calm and comfortable Respiratory system: Clear to auscultation. Respiratory effort normal. Cardiovascular system: S1 & S2 heard, normal rate Gastrointestinal system: Abdomen is nondistended, soft and nontender.  Normal bowel sounds heard. Central nervous system: Alert and oriented. Musculoskeletal: No edema. No calf tenderness Skin: No cyanosis. No rashes Psychiatry: Judgement and insight appear normal. Mood & affect appropriate.   Condition at discharge: stable  The results of significant diagnostics from this hospitalization (including imaging, microbiology, ancillary and laboratory) are listed below for reference.   Imaging Studies: EEG adult  Result Date: 01/10/22 Zeb Comfort  Jenetta Downer, MD     01/04/2022 12:47 PM Patient Name: [name] [name] MRN: [id number] Epilepsy Attending: Lora Havens Referring Physician/Provider: Toy Baker, MD Date: 01/04/2022 Duration: 25.59 mins Patient history: 67 year old male with altered mental status.  EEG to evaluate for seizure. Level of alertness: Awake AEDs during EEG study: Xanax Technical aspects: This EEG study was done with scalp electrodes positioned according to the 10-20 International system of electrode placement. Electrical activity was reviewed with band pass filter of 1-'70Hz'$ , sensitivity of 7 uV/mm, display speed of 76m/sec with a '60Hz'$  notched filter applied as  appropriate. EEG data were recorded continuously and digitally stored.  Video monitoring was available and reviewed as appropriate. Description: The posterior dominant rhythm consists of 9-10 Hz activity of moderate voltage (25-35 uV) seen predominantly in posterior head regions, symmetric and reactive to eye opening and eye closing. Sleep was characterized by vertex waves, sleep spindles (12 to 14 Hz), maximal frontocentral region. There is an excessive amount of 15 to 18 Hz beta activity distributed symmetrically and diffusely.  Hyperventilation and photic stimulation were not performed.   ABNORMALITY - Excessive beta, generalized IMPRESSION: This study is within normal limits. The excessive beta activity seen in the background is most likely due to the effect of benzodiazepine and is a benign EEG pattern. No seizures or epileptiform discharges were seen throughout the recording. PLora Havens  ECHOCARDIOGRAM COMPLETE  Result Date: 01/04/2022    ECHOCARDIOGRAM REPORT   Patient Name:   [name] CANOYDate of Exam: 01/04/2022 Medical Rec #:  [id number]           Height:       74.0 in Accession #:    [id number]          Weight:       172.4 lb Date of Birth:  [birth year]            BSA:          2.041 m Patient Age:    66years             BP:           185/93 mmHg Patient Gender: M                    HR:           50 bpm. Exam Location:  Inpatient Procedure: 2D Echo, Cardiac Doppler and Color Doppler Indications:    Abnormal ECG  History:        Patient has prior history of Echocardiogram examinations, most                 recent 02/15/2021. CHF, CAD, Prior CABG, PAD, Arrythmias:Atrial                 Fibrillation; Risk Factors:Diabetes, Hypertension and                 Dyslipidemia.  Sonographer:    SEartha InchReferring Phys: [id number] DOUTOVA  Sonographer Comments: Technically challenging study due to limited acoustic windows. Image acquisition challenging due to patient body habitus and Image  acquisition challenging due to respiratory motion. IMPRESSIONS  1. Left ventricular ejection fraction, by estimation, is 60 to 65%. The left ventricle has normal function. The left ventricle has no regional wall motion abnormalities. There is moderate asymmetric left ventricular hypertrophy of the basal-septal segment. Left ventricular diastolic parameters are indeterminate.  2. Right ventricular systolic function is normal. The  right ventricular size is normal. Tricuspid regurgitation signal is inadequate for assessing PA pressure.  3. The mitral valve is normal in structure. Trivial mitral valve regurgitation. No evidence of mitral stenosis.  4. The aortic valve is tricuspid. Aortic valve regurgitation is not visualized. Aortic valve sclerosis/calcification is present, without any evidence of aortic stenosis.  5. Aortic dilatation noted. There is mild dilatation of the ascending aorta, measuring 38 mm.  6. The inferior vena cava is dilated in size with >50% respiratory variability, suggesting right atrial pressure of 8 mmHg. FINDINGS  Left Ventricle: Left ventricular ejection fraction, by estimation, is 60 to 65%. The left ventricle has normal function. The left ventricle has no regional wall motion abnormalities. The left ventricular internal cavity size was normal in size. There is  moderate asymmetric left ventricular hypertrophy of the basal-septal segment. Left ventricular diastolic parameters are indeterminate. Right Ventricle: The right ventricular size is normal. Right vetricular wall thickness was not well visualized. Right ventricular systolic function is normal. Tricuspid regurgitation signal is inadequate for assessing PA pressure. Left Atrium: Left atrial size was normal in size. Right Atrium: Right atrial size was normal in size. Pericardium: There is no evidence of pericardial effusion. Mitral Valve: The mitral valve is normal in structure. Trivial mitral valve regurgitation. No evidence of mitral  valve stenosis. MV peak gradient, 3.9 mmHg. The mean mitral valve gradient is 1.0 mmHg. Tricuspid Valve: The tricuspid valve is normal in structure. Tricuspid valve regurgitation is trivial. Aortic Valve: The aortic valve is tricuspid. Aortic valve regurgitation is not visualized. Aortic valve sclerosis/calcification is present, without any evidence of aortic stenosis. Pulmonic Valve: The pulmonic valve was not well visualized. Pulmonic valve regurgitation is trivial. Aorta: The aortic root is normal in size and structure and aortic dilatation noted. There is mild dilatation of the ascending aorta, measuring 38 mm. Venous: The inferior vena cava is dilated in size with greater than 50% respiratory variability, suggesting right atrial pressure of 8 mmHg. IAS/Shunts: No atrial level shunt detected by color flow Doppler.  LEFT VENTRICLE PLAX 2D LVIDd:         4.25 cm      Diastology LVIDs:         3.00 cm      LV e' medial:    6.18 cm/s LV PW:         1.10 cm      LV E/e' medial:  12.5 LV IVS:        1.35 cm      LV e' lateral:   8.59 cm/s LVOT diam:     2.20 cm      LV E/e' lateral: 9.0 LVOT Area:     3.80 cm  LV Volumes (MOD) LV vol d, MOD A2C: 123.0 ml LV vol d, MOD A4C: 80.3 ml LV vol s, MOD A2C: 40.6 ml LV vol s, MOD A4C: 26.8 ml LV SV MOD A2C:     82.4 ml LV SV MOD A4C:     80.3 ml LV SV MOD BP:      67.4 ml RIGHT VENTRICLE             IVC RV S prime:     10.10 cm/s  IVC diam: 2.10 cm TAPSE (M-mode): 1.0 cm LEFT ATRIUM             Index        RIGHT ATRIUM          Index LA diam:  4.10 cm 2.01 cm/m   RA Area:     9.32 cm LA Vol (A2C):   64.5 ml 31.61 ml/m  RA Volume:   15.50 ml 7.60 ml/m LA Vol (A4C):   51.6 ml 25.29 ml/m LA Biplane Vol: 60.7 ml 29.75 ml/m   AORTA Ao Root diam: 3.90 cm Ao Asc diam:  3.80 cm MITRAL VALVE MV Peak grad: 3.9 mmHg     SHUNTS MV Mean grad: 1.0 mmHg     Systemic Diam: 2.20 cm MV Vmax:      0.98 m/s MV Vmean:     50.3 cm/s MR Peak grad: 28.5 mmHg MR Vmax:      267.00 cm/s MV  E velocity: 77.50 cm/s Oswaldo Milian MD Electronically signed by Oswaldo Milian MD Signature Date/Time: 01/04/2022/11:40:35 AM    Final    US RENAL  Result Date: 01/04/2022 CLINICAL DATA:  Hematuria EXAM: RENAL / URINARY TRACT ULTRASOUND COMPLETE COMPARISON:  07/18/2021, plain film from 01/03/2022 FINDINGS: Right Kidney: Renal measurements: 12.6 x 5.9 x 5.6 cm. = volume: 217.4 mL. Echogenicity within normal limits. No mass or hydronephrosis visualized. Left Kidney: Renal measurements: 13 x 7.3 x 5.1 cm. = volume: 252.3 mL. Echogenicity within normal limits. No mass or hydronephrosis visualized. Bladder: Bladder is well distended. Some dependent debris is noted. Adjacent to the bladder is the reservoir for the patient's penile implant system. Other: Large bilateral pleural effusions are noted. IMPRESSION: Normal appearing kidneys bilaterally. Bladder is well distended with some dependent debris. Correlate with laboratory values. Large bilateral pleural effusions are noted increased from prior plain film. Electronically Signed   By: Inez Catalina M.D.   On: 01/04/2022 03:20   MR BRAIN W WO CONTRAST  Result Date: 01/04/2022 CLINICAL DATA:  Psychosis EXAM: MRI HEAD WITHOUT AND WITH CONTRAST TECHNIQUE: Multiplanar, multiecho pulse sequences of the brain and surrounding structures were obtained without and with intravenous contrast. CONTRAST:  40m GADAVIST GADOBUTROL 1 MMOL/ML IV SOLN COMPARISON:  None Available. FINDINGS: Brain: No acute infarction, hemorrhage, hydrocephalus, extra-axial collection or mass lesion. Single chronic microhemorrhage in the left cerebellum. Midline structures are normal. Mild periventricular white matter hyperintensity. 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 Mild volume loss. No abnormal contrast enhancement. Vascular: Normal flow voids Skull and upper cervical spine: Normal bone marrow signal. Sinuses/Orbits: Paranasal sinuses and mastoids are clear.  Normal orbits. Other: Nasopharynx is clear. IMPRESSION: 1. No acute intracranial abnormality. 2. Chronic small vessel disease Electronically Signed   By: KUlyses JarredM.D.   On: 01/04/2022 03:19   DG CHEST PORT 1 VIEW  Result Date: 01/03/2022 CLINICAL DATA:  Bradycardia. EXAM: PORTABLE CHEST 1 VIEW COMPARISON:  Chest radiograph 07/17/2021 FINDINGS: Prior median sternotomy. Mild cardiomegaly. Aortic atherosclerosis. There are small bilateral pleural effusions, tracking laterally on the right. Vertical lucency in the right lower lung zone is likely an overlying skin fold. No evidence of pneumothorax. Mild peribronchial thickening. IMPRESSION: 1. Mild cardiomegaly with small bilateral pleural effusions. 2. Mild peribronchial thickening, can be seen with bronchitis or pulmonary edema. Electronically Signed   By: MKeith RakeM.D.   On: 01/03/2022 23:24   CT Head Wo Contrast  Result Date: 01/03/2022 CLINICAL DATA:  Hallucinations EXAM: CT HEAD WITHOUT CONTRAST TECHNIQUE: Contiguous axial images were obtained from the base of the skull through the vertex without intravenous contrast. RADIATION DOSE REDUCTION: This exam was performed according to the departmental dose-optimization program which includes automated exposure control, adjustment of the mA and/or kV according to patient  size and/or use of iterative reconstruction technique. COMPARISON:  05/16/2020 FINDINGS: Brain: No acute intracranial findings are seen. There are no signs of bleeding within the cranium. Cortical sulci are prominent. There is decreased density in periventricular white matter. Vascular: Scattered arterial calcifications are seen. Skull: Unremarkable. Sinuses/Orbits: Unremarkable. Other: None. IMPRESSION: No acute intracranial findings are seen in noncontrast CT brain. Atrophy. Small-vessel disease. Electronically Signed   By: Elmer Picker M.D.   On: 01/03/2022 12:54    Microbiology: Results for orders placed or performed  during the hospital encounter of 01/03/22  Urine Culture     Status: Abnormal   Collection Time: 01/04/22  1:27 AM   Specimen: Urine, Clean Catch  Result Value Ref Range Status   Specimen Description URINE, CLEAN CATCH  Final   Special Requests   Final    NONE Performed at [facility], [address], [place], [place] [address]    Culture >=100,000 COLONIES/mL ENTEROCOCCUS FAECALIS (A)  Final   Report Status 01/06/2022 FINAL  Final   Organism ID, Bacteria ENTEROCOCCUS FAECALIS (A)  Final      Susceptibility   Enterococcus faecalis - MIC*    AMPICILLIN <=2 SENSITIVE Sensitive     NITROFURANTOIN <=16 SENSITIVE Sensitive     VANCOMYCIN 1 SENSITIVE Sensitive     * >=100,000 COLONIES/mL ENTEROCOCCUS FAECALIS    Labs: CBC: Recent Labs  Lab 01/03/22 1235 01/03/22 1910 01/04/22 0400 01/05/22 0758 01/06/22 0101  WBC 5.0  --  5.1 6.3 5.9  HGB 12.6* 12.9* 11.6* 13.2 12.0*  HCT 38.7* 38.0* 35.7* 40.2 35.9*  MCV 88.6  --  88.4 88.0 86.3  PLT 176  --  171 195 517   Basic Metabolic Panel: Recent Labs  Lab 01/03/22 1235 01/03/22 1910 01/03/22 2006 01/04/22 0400 01/05/22 0758 01/06/22 0101  NA 134* 137  --  131* 135 133*  K 3.5 3.5  --  3.0* 3.7 3.5  CL 95*  --   --  95* 97* 98  CO2 32  --   --  '30 30 30  '$ GLUCOSE 302*  --   --  119* 148* 252*  BUN 14  --   --  '12 14 17  '$ CREATININE 0.76  --   --  0.65 0.91 0.90  CALCIUM 8.8*  --   --  7.9* 8.2* 7.9*  MG  --   --  1.7 1.8  --   --   PHOS  --   --  3.6 3.5  --   --    Liver Function Tests: Recent Labs  Lab 01/03/22 1235 01/04/22 0400  AST 16 17  ALT 7 9  ALKPHOS 93 79  BILITOT 0.7 0.8  PROT 6.6 5.3*  ALBUMIN 3.3* 2.4*   CBG: Recent Labs  Lab 01/05/22 0725 01/05/22 1028 01/05/22 1601 01/05/22 2110 01/06/22 0637  GLUCAP 144* 175* 182* 296* 231*    Discharge time spent: 35 minutes.  Signed: Cordelia Poche, MD [facility] 01/06/2022

## 2022-01-06 NOTE — Progress Notes (Signed)
Progress Note  Patient Name: [name] [name] Date of Encounter: 01/06/2022  Primary Cardiologist: Evalina Field, MD  Subjective   No acute meds overnight.  No symptoms.  Patient has dizziness only with movements of his neck but denied vertigo.  No dizziness at rest or with exertion.  No chest pain or SOB.  He is wanting to go home.  He said he came to the hospital due to difference in opinion between his son and him.  Telemetry reviewed, HR 30 to 40s overnight and in atrial fibrillation.  Inpatient Medications    Scheduled Meds:  amLODipine  10 mg Oral QHS   apixaban  5 mg Oral BID   aspirin EC  81 mg Oral Daily   atorvastatin  40 mg Oral Daily   feeding supplement  237 mL Oral BID BM   furosemide  40 mg Intravenous Q12H   insulin aspart  0-15 Units Subcutaneous TID WC   insulin aspart  0-5 Units Subcutaneous QHS   insulin detemir  6 Units Subcutaneous Daily   losartan  50 mg Oral Daily   mirtazapine  15 mg Oral QHS   multivitamin with minerals  1 tablet Oral Daily   PARoxetine  20 mg Oral Daily   sodium chloride flush  3 mL Intravenous Q12H   tamsulosin  0.4 mg Oral QPC supper   thiamine  100 mg Oral Daily   Continuous Infusions:  sodium chloride     magnesium sulfate bolus IVPB Stopped (01/04/22 0436)   vancomycin     PRN Meds: sodium chloride, acetaminophen **OR** acetaminophen, HYDROcodone-acetaminophen, mouth rinse, sodium chloride flush   Vital Signs    Vitals:   01/05/22 1018 01/05/22 1603 01/05/22 1948 01/06/22 0451  BP: (!) 173/85 (!) 155/83 (!) 157/81 122/80  Pulse: (!) 58 61 (!) 59 (!) 102  Resp: '18 20 20 20  '$ Temp: 98.1 F (36.7 C) 98.1 F (36.7 C) 98.2 F (36.8 C) 97.7 F (36.5 C)  TempSrc: Oral Oral Oral Oral  SpO2: 98% 95% 96% 93%  Weight:    76.1 kg  Height:        Intake/Output Summary (Last 24 hours) at 01/06/2022 0901 Last data filed at 01/06/2022 0455 Gross per 24 hour  Intake 460 ml  Output 1350 ml  Net -890 ml   Filed Weights    01/04/22 0002 01/05/22 0006 01/06/22 0451  Weight: 78.2 kg 80.8 kg 76.1 kg    Telemetry     Personally reviewed, HR 30-40s overnight and in atrial fibrillation  ECG    Atrial fibrillation  Physical Exam   GEN: No acute distress.   Neck: No JVD. Cardiac: Irregular rate and rhythm, no murmur, rub, or gallop.  Respiratory: Nonlabored. Clear to auscultation bilaterally. GI: Soft, nontender, bowel sounds present. MS: No edema; No deformity. Neuro:  Nonfocal. Psych: Alert and oriented x 3. Normal affect.  Labs    Chemistry Recent Labs  Lab 01/03/22 1235 01/03/22 1910 01/04/22 0400 01/05/22 0758 01/06/22 0101  NA 134*   < > 131* 135 133*  K 3.5   < > 3.0* 3.7 3.5  CL 95*  --  95* 97* 98  CO2 32  --  '30 30 30  '$ GLUCOSE 302*  --  119* 148* 252*  BUN 14  --  '12 14 17  '$ CREATININE 0.76  --  0.65 0.91 0.90  CALCIUM 8.8*  --  7.9* 8.2* 7.9*  PROT 6.6  --  5.3*  --   --  ALBUMIN 3.3*  --  2.4*  --   --   AST 16  --  17  --   --   ALT 7  --  9  --   --   ALKPHOS 93  --  79  --   --   BILITOT 0.7  --  0.8  --   --   GFRNONAA >60  --  >60 >60 >60  ANIONGAP 7  --  '6 8 5   '$ < > = values in this interval not displayed.     Hematology Recent Labs  Lab 01/04/22 0400 01/05/22 0758 01/06/22 0101  WBC 5.1 6.3 5.9  RBC 4.04*  4.02* 4.57 4.16*  HGB 11.6* 13.2 12.0*  HCT 35.7* 40.2 35.9*  MCV 88.4 88.0 86.3  MCH 28.7 28.9 28.8  MCHC 32.5 32.8 33.4  RDW 15.4 15.8* 15.7*  PLT 171 195 175    Cardiac Enzymes Recent Labs  Lab 01/03/22 2006 01/03/22 2150  TROPONINIHS 20* 18*    BNPNo results for input(s): "BNP", "PROBNP" in the last 168 hours.   DDimerNo results for input(s): "DDIMER" in the last 168 hours.   Radiology    EEG adult  Result Date: 01/04/2022 Lora Havens, MD     01/04/2022 12:47 PM Patient Name: [name] [name] MRN: [id number] Epilepsy Attending: Lora Havens Referring Physician/Provider: Toy Baker, MD Date: 01/04/2022 Duration:  25.59 mins Patient history: 67 year old male with altered mental status.  EEG to evaluate for seizure. Level of alertness: Awake AEDs during EEG study: Xanax Technical aspects: This EEG study was done with scalp electrodes positioned according to the 10-20 International system of electrode placement. Electrical activity was reviewed with band pass filter of 1-'70Hz'$ , sensitivity of 7 uV/mm, display speed of 55m/sec with a '60Hz'$  notched filter applied as appropriate. EEG data were recorded continuously and digitally stored.  Video monitoring was available and reviewed as appropriate. Description: The posterior dominant rhythm consists of 9-10 Hz activity of moderate voltage (25-35 uV) seen predominantly in posterior head regions, symmetric and reactive to eye opening and eye closing. Sleep was characterized by vertex waves, sleep spindles (12 to 14 Hz), maximal frontocentral region. There is an excessive amount of 15 to 18 Hz beta activity distributed symmetrically and diffusely.  Hyperventilation and photic stimulation were not performed.   ABNORMALITY - Excessive beta, generalized IMPRESSION: This study is within normal limits. The excessive beta activity seen in the background is most likely due to the effect of benzodiazepine and is a benign EEG pattern. No seizures or epileptiform discharges were seen throughout the recording. PLora Havens  ECHOCARDIOGRAM COMPLETE  Result Date: 01/04/2022    ECHOCARDIOGRAM REPORT   Patient Name:   [name] COOKSONDate of Exam: 01/04/2022 Medical Rec #:  [id number]           Height:       74.0 in Accession #:    [id number]          Weight:       172.4 lb Date of Birth:  [birth year]            BSA:          2.041 m Patient Age:    89years             BP:           185/93 mmHg Patient Gender: M  HR:           50 bpm. Exam Location:  Inpatient Procedure: 2D Echo, Cardiac Doppler and Color Doppler Indications:    Abnormal ECG  History:        Patient has  prior history of Echocardiogram examinations, most                 recent 02/15/2021. CHF, CAD, Prior CABG, PAD, Arrythmias:Atrial                 Fibrillation; Risk Factors:Diabetes, Hypertension and                 Dyslipidemia.  Sonographer:    Eartha Inch Referring Phys: [id number] ANASTASSIA DOUTOVA  Sonographer Comments: Technically challenging study due to limited acoustic windows. Image acquisition challenging due to patient body habitus and Image acquisition challenging due to respiratory motion. IMPRESSIONS  1. Left ventricular ejection fraction, by estimation, is 60 to 65%. The left ventricle has normal function. The left ventricle has no regional wall motion abnormalities. There is moderate asymmetric left ventricular hypertrophy of the basal-septal segment. Left ventricular diastolic parameters are indeterminate.  2. Right ventricular systolic function is normal. The right ventricular size is normal. Tricuspid regurgitation signal is inadequate for assessing PA pressure.  3. The mitral valve is normal in structure. Trivial mitral valve regurgitation. No evidence of mitral stenosis.  4. The aortic valve is tricuspid. Aortic valve regurgitation is not visualized. Aortic valve sclerosis/calcification is present, without any evidence of aortic stenosis.  5. Aortic dilatation noted. There is mild dilatation of the ascending aorta, measuring 38 mm.  6. The inferior vena cava is dilated in size with >50% respiratory variability, suggesting right atrial pressure of 8 mmHg. FINDINGS  Left Ventricle: Left ventricular ejection fraction, by estimation, is 60 to 65%. The left ventricle has normal function. The left ventricle has no regional wall motion abnormalities. The left ventricular internal cavity size was normal in size. There is  moderate asymmetric left ventricular hypertrophy of the basal-septal segment. Left ventricular diastolic parameters are indeterminate. Right Ventricle: The right ventricular size is  normal. Right vetricular wall thickness was not well visualized. Right ventricular systolic function is normal. Tricuspid regurgitation signal is inadequate for assessing PA pressure. Left Atrium: Left atrial size was normal in size. Right Atrium: Right atrial size was normal in size. Pericardium: There is no evidence of pericardial effusion. Mitral Valve: The mitral valve is normal in structure. Trivial mitral valve regurgitation. No evidence of mitral valve stenosis. MV peak gradient, 3.9 mmHg. The mean mitral valve gradient is 1.0 mmHg. Tricuspid Valve: The tricuspid valve is normal in structure. Tricuspid valve regurgitation is trivial. Aortic Valve: The aortic valve is tricuspid. Aortic valve regurgitation is not visualized. Aortic valve sclerosis/calcification is present, without any evidence of aortic stenosis. Pulmonic Valve: The pulmonic valve was not well visualized. Pulmonic valve regurgitation is trivial. Aorta: The aortic root is normal in size and structure and aortic dilatation noted. There is mild dilatation of the ascending aorta, measuring 38 mm. Venous: The inferior vena cava is dilated in size with greater than 50% respiratory variability, suggesting right atrial pressure of 8 mmHg. IAS/Shunts: No atrial level shunt detected by color flow Doppler.  LEFT VENTRICLE PLAX 2D LVIDd:         4.25 cm      Diastology LVIDs:         3.00 cm      LV e' medial:    6.18 cm/s LV  PW:         1.10 cm      LV E/e' medial:  12.5 LV IVS:        1.35 cm      LV e' lateral:   8.59 cm/s LVOT diam:     2.20 cm      LV E/e' lateral: 9.0 LVOT Area:     3.80 cm  LV Volumes (MOD) LV vol d, MOD A2C: 123.0 ml LV vol d, MOD A4C: 80.3 ml LV vol s, MOD A2C: 40.6 ml LV vol s, MOD A4C: 26.8 ml LV SV MOD A2C:     82.4 ml LV SV MOD A4C:     80.3 ml LV SV MOD BP:      67.4 ml RIGHT VENTRICLE             IVC RV S prime:     10.10 cm/s  IVC diam: 2.10 cm TAPSE (M-mode): 1.0 cm LEFT ATRIUM             Index        RIGHT ATRIUM           Index LA diam:        4.10 cm 2.01 cm/m   RA Area:     9.32 cm LA Vol (A2C):   64.5 ml 31.61 ml/m  RA Volume:   15.50 ml 7.60 ml/m LA Vol (A4C):   51.6 ml 25.29 ml/m LA Biplane Vol: 60.7 ml 29.75 ml/m   AORTA Ao Root diam: 3.90 cm Ao Asc diam:  3.80 cm MITRAL VALVE MV Peak grad: 3.9 mmHg     SHUNTS MV Mean grad: 1.0 mmHg     Systemic Diam: 2.20 cm MV Vmax:      0.98 m/s MV Vmean:     50.3 cm/s MR Peak grad: 28.5 mmHg MR Vmax:      267.00 cm/s MV E velocity: 77.50 cm/s Oswaldo Milian MD Electronically signed by Oswaldo Milian MD Signature Date/Time: 01/04/2022/11:40:35 AM    Final     Patient profile   Patient is a 67 year old M known to have CAD status post four-vessel CABG (LIMA to LAD, RIMA to distal RCA, L radial total LCx, reverse SVG to D1) on 09/30/2001, PAD, HTN, HLD, DM2 on chronic insulin therapy, PAF and HFpEF is currently admitted to the hospitalist team for the management of hallucinations. Cardiology was consulted for evaluation of bradycardia.  Assessment & Plan    # Dizziness -Patient has dizziness with his neck movements and denied vertigo. No dizziness at rest or exertion but he did have multiple falls in the past.  No overnight events. Telemetry showed A-fib with slow ventricular response, HR 30 to 40s during the night. No indication of pacing at this point. Patient already has a 2-week event monitor in place.  # A-fib with slow ventricular response -Avoid AV nodal agents due to slow ventricular response.  2-week event monitor in place. -Continue Eliquis 5 mg twice daily  # CAD s/p CABG in 2003, currently angina free -No need of aspirin due to concurrent Eliquis use -Continue atorvastatin 40 mg nightly  # HTN, controlled -On IV Lasix 40 mg twice daily for HTN control. Recommend to switch to p.o. Lasix 40 mg once daily (home medication). -Continue amlodipine 10 mg once daily -Continue losartan 50 mg once daily  I have spent a total of 33 minutes with patient  reviewing chart , telemetry, EKGs, labs and examining patient as well as establishing  an assessment and plan that was discussed with the patient.  > 50% of time was spent in direct patient care.     Signed, Chalmers Guest, MD  01/06/2022, 9:01 AM

## 2022-01-06 NOTE — Progress Notes (Signed)
Pharmacy Antibiotic Note  [name] [name] is a 67 y.o. male admitted on 01/03/2022 with UTI.  Patient originally started on ceftriaxone 1/4 for UTI, however, urine cx are now growing e faecalis (susceptibilities pending).  Pharmacy has been consulted for vancomycin dosing.  Plan: -Vancomycin '1000mg'$  IV every 12 hours. eAUC 489 (TBW 76.1kg, Scr 0.9, Vd 0.72) -Monitor renal function, clinical picture daily  -De-escalate abx pending susceptibilities    Height: '6\' 2"'$  (188 cm) Weight: 76.1 kg (167 lb 12.3 oz) IBW/kg (Calculated) : 82.2  Temp (24hrs), Avg:98 F (36.7 C), Min:97.7 F (36.5 C), Max:98.2 F (36.8 C)  Recent Labs  Lab 01/03/22 1235 01/04/22 0400 01/05/22 0758 01/06/22 0101  WBC 5.0 5.1 6.3 5.9  CREATININE 0.76 0.65 0.91 0.90    Estimated Creatinine Clearance: 84.6 mL/min (by C-G formula based on SCr of 0.9 mg/dL).    Allergies  Allergen Reactions   Wool Alcohol [Lanolin] Hives and Itching    Wool fabric    Antimicrobials this admission: 1/4 ceftriaxone >> 1/6 1/6 vancomycin >>    Microbiology results: 1/4 UCx: enterococcus faecalis     Thank you for allowing pharmacy to be a part of this patient's care.  Billey Gosling, PharmD PGY1 Pharmacy Resident 1/6/20248:13 AM

## 2022-01-06 NOTE — Discharge Instructions (Signed)
[name] [name],  You were in the hospital with hallucinations and slow heart rate (bradycardia). Regarding your hallucinations, this may have been related to a urinary infection.you are on antibiotics for treatment. Regarding your heart rate, the cardiologist has recommended for you to have a heart rate monitor patch and to follow-up as an outpatient.

## 2022-01-06 NOTE — Inpatient Diabetes Management (Signed)
Inpatient Diabetes Program Recommendations  AACE/ADA: New Consensus Statement on Inpatient Glycemic Control   Target Ranges:  Prepandial:   less than 140 mg/dL      Peak postprandial:   less than 180 mg/dL (1-2 hours)      Critically ill patients:  140 - 180 mg/dL    Latest Reference Range & Units 01/05/22 07:25 01/05/22 10:28 01/05/22 14:18 01/05/22 16:01 01/05/22 21:10 01/06/22 06:37  Glucose-Capillary 70 - 99 mg/dL 144 (H)  175 (H)  Novolog 2 units     Levemir 6 units 182 (H)  Novolog 3 units 296 (H)  Novolog 3 units 231 (H)  Novolog 5 units   Review of Glycemic Control  Diabetes history: Type 2 DM Outpatient Diabetes medications: insulin pump- Omnipod/Dexcom Current orders for Inpatient glycemic control: Levemir 6 units daily, Novolog 0-15 units TID with meals, Novolog 0-5 units QHS   Inpatient Diabetes Program Recommendations:     Insulin: If patient is continued on SQ insulin regimen, please consider increasing Levemir to 12 units daily. If patient is going to resume insulin pump today, please discontinue Levemir and Novolog SQ orders and use Insulin Pump order set to order CBGs and Insulin Pump AC&HS and 2am.  NOTE: Noted consult for Diabetes Coordinator. Diabetes Coordinator is not on campus over the weekend but available by pager from 8am to 5pm for questions or concerns. [name], MSN, RNC-OB, Inpatient diabetes coordinator, spoke with patient on 01/05/22. Per diabetes coordinator note on 01/05/22, patient is followed by Dr Kelton Pillar (Endocrinologist; last seen 11/17/21) and uses an OmniPod insulin pump with the following settings:    Basal insulin  0000-0000       0.7 units/hour Total daily basal insulin: 16.8 units/24 hours Carb Coverage 1:1       1 unit for every 1 grams of carbohydrates Insulin Sensitivity 1:60     1 unit drops blood glucose 60 mg/dl Target Glucose Goals 0000-0000       100-120 mg/dl  Thanks, [name] Alderman, RN, MSN, [facility]  Coordinator Inpatient Diabetes Program [phone] (Team Pager from 8am to [place])

## 2022-01-06 NOTE — Hospital Course (Signed)
[name] [name] is a 67 y.o. male with a hsitory of atrial fibrillation on Eliquis, CAD s/p CABG, PAD, diastolic heart failure, diabetes mellitus type 2 on insulin pump, hypertension, hyperlipidemia. Patient presented secondary to hallucinations and found to have evidence of bradycardia. MRI and EEG unrevealing for etiology of hallucinations. Symptoms improved with antibiotics for treatment of UTI. Cardiology consulted for bradycardia and recommended a Zio patch with outpatient cardiology follow-up.

## 2022-01-06 NOTE — Plan of Care (Signed)

## 2022-01-07 ENCOUNTER — Telehealth: Payer: Self-pay | Admitting: Physician Assistant

## 2022-01-07 LAB — VITAMIN B1: Vitamin B1 (Thiamine): 101.7 nmol/L (ref 66.5–200.0)

## 2022-01-07 NOTE — Telephone Encounter (Signed)
Second call received from [facility] for  Slow ventricular Afib 39 for 60 seconds. This was asymptomatic. This occurred at 10:57 AM.   I will ask EP to schedule an appt.

## 2022-01-07 NOTE — Telephone Encounter (Signed)
Call returned to [employer]:  Auto-triggered event for slow Afib with VR 39 at 8:06 AM [place].   8:19 am VR was 40 bpm  Confirmed patient is on eliquis. I attempted to reach the patient and left a VM to call the office back.

## 2022-01-08 ENCOUNTER — Telehealth: Payer: Self-pay | Admitting: Physician Assistant

## 2022-01-08 ENCOUNTER — Telehealth: Payer: Self-pay

## 2022-01-08 DIAGNOSIS — N390 Urinary tract infection, site not specified: Secondary | ICD-10-CM | POA: Diagnosis not present

## 2022-01-08 NOTE — Patient Outreach (Signed)
  Care Coordination TOC Note Transition Care Management Unsuccessful Follow-up Telephone Call  Date of discharge and from where:  01/06/22-[facility]   Attempts:  1st Attempt  Reason for unsuccessful TCM follow-up call:  Left voice message     Hetty Blend [place] Management Telephonic Care Management Coordinator Direct Phone: [phone] Toll Free: [phone] Fax: [phone]

## 2022-01-08 NOTE — Telephone Encounter (Signed)
   Cardiac Monitor Alert  Date of alert:  01/08/2022   Patient Name: [name] [name]  DOB: [birth year]  MRN: [id number]   [place] Cardiologist: Evalina Field, MD  [facility] EP:  None    Monitor Information: Long Term Monitor-Live Telemetry [ZioAT]  Reason:  Syncope and Collapse Ordering provider:  Almyra Deforest, PA-C   Alert Atrial Fibrillation/Flutter This is the  6th  alert for this rhythm.  The patient has a hx of Atrial Fibrillation/Flutter.     Anticoagulation medication as of 01/08/2022           apixaban (ELIQUIS) 5 MG TABS tablet Take 1 tablet (5 mg total) by mouth 2 (two) times daily. PLEASE SCHEDULE APPT WITH ALYSSA FOR FURTHER REFILLS [phone]       Next Cardiology Appointment   Date:  2/19 '@10AM'$   Provider:  Dr. Norberto Sorenson - EP   The patient could NOT be reached by telephone today.  Left voice mail Per telephone encounter yesterday patient scheduled with EP next month    Other:  Newt Minion, RN  02/09/5282 13:24 AM Elmo Putt from [place]. EKG Auto trigger  Slow A.Fib HR37 for 60 sec. 3:13AM  Slow A.Fib HR 38 for 60 secs. 3.39pm  6 more episode from 7am-8:30am - slow a.fib HR 37-40 each lasting about 60 seconds.

## 2022-01-08 NOTE — Progress Notes (Deleted)
Name: [name] [name]  Age/ Sex: 67 y.o., male   MRN/ DOB: [id number], [birth year]     PCP: Fredirick Lathe, PA-C   Reason for Endocrinology Evaluation: Type 2 Diabetes Mellitus  Initial Endocrine Consultative Visit: 07/15/2020    PATIENT IDENTIFIER: [name] [name] is a 67 y.o. male with a past medical history of DM, CAD, HTN, PVD, PAF. The patient has followed with Endocrinology clinic since 07/15/2020 for consultative assistance with management of his diabetes.  DIABETIC HISTORY:  [name] [name] was diagnosed with DM in 2011, and started insulin therapy in 2013. His hemoglobin A1c has ranged from 9.2% in 2023, peaking at >15.0% in 2022.  Saw Dr. Loanne Drilling last in 04/2021 SUBJECTIVE:   During the last visit (11/17/2021): A1c 7.8%  Today (01/08/2022): [name] [name] is here for a follow up on diabetes management.  He checks his blood sugars multiple  times daily. The patient has not had hypoglycemic episodes since the last clinic visit.  Since his last visit here the patient had a hospitalization for bradycardia and A-fib as well as hallucinations that was attributed to an acute infection.  He was given ceftriaxone for UTI.    Denies nausea, vomiting  Chronic diarrhea is improving  Left eye sx pending 11/30   This patient with type 2 diabetes is treated with Omnipod  (insulin pump). During the visit the pump basal and bolus doses were reviewed including carb/insulin rations and supplemental doses. The clinical list was updated. The glucose meter download was reviewed in detail to determine if the current pump settings are providing the best glycemic control without excessive hypoglycemia.  Pump and meter download:    Pump   Omnipod Settings   Insulin type   Novolog    Basal rate       0000 0.7              I:C ratio       0000 1:1                  Sensitivity       0000  60      Goal       0000  120            Type & Model of Pump: Omnipod  Insulin  Type: Currently using Novolog .    PUMP STATISTICS: Average BG: 273 Average Daily Carbs (g): 6.6 Average Total Daily Insulin: 14.7 Average Daily Basal: 11.4 (78 %) Average Daily Bolus: 3.3 (22 %)        HOME DIABETES REGIMEN:  Novolog   Statin: yes ACE-I/ARB: yes    CONTINUOUS GLUCOSE MONITORING RECORD INTERPRETATION    Dates of Recording: 11/4-11/17/2023  Sensor description:dexcom  Results statistics:   CGM use % of time 55.6  Average and SD 273  Time in range 17 %  % Time Above 180 23  % Time above 250 59  % Time Below target 1      Glycemic patterns summary: Hyperglycemia noted during the day and night   Hyperglycemic episodes  all day ad night   Hypoglycemic episodes occurred at night   Overnight periods: variable      DIABETIC COMPLICATIONS: Microvascular complications:  Retinopathy B/L , blind in left eye  Denies: CKD Last Eye Exam: Completed 2023  Macrovascular complications:  CAD (S/P CABG) Denies: CVA,   HISTORY:  Past Medical History:  Past Medical History:  Diagnosis Date   Anxiety    Arthritis  Cataract    CHF (congestive heart failure) (HCC)    Constipation    pt states goes EOD- hard stools    Coronary artery disease    Depression    Diabetes mellitus without complication (HCC)    Diabetic retinopathy ([place])    DKA (diabetic ketoacidoses)    Hyperlipidemia    Hypertension    off meds   Hypertensive retinopathy    Postherpetic neuralgia 08/11/2021   Speculative diagnosis   Psoriasis    Tubular adenoma of colon 03/2019   Past Surgical History:  Past Surgical History:  Procedure Laterality Date   ABDOMINAL AORTOGRAM W/LOWER EXTREMITY N/A 08/30/2020   Procedure: ABDOMINAL AORTOGRAM W/LOWER EXTREMITY;  Surgeon: Serafina Mitchell, MD;  Location: [facility];  Service: Cardiovascular;  Laterality: N/A;   ABDOMINAL AORTOGRAM W/LOWER EXTREMITY N/A 11/15/2020   Procedure: ABDOMINAL AORTOGRAM W/LOWER EXTREMITY;   Surgeon: Serafina Mitchell, MD;  Location: [facility];  Service: Cardiovascular;  Laterality: N/A;   ANKLE SURGERY     right   CARDIAC CATHETERIZATION  09/25/2001   COLONOSCOPY     ~10 yr ago- normal  per pt    CORONARY ARTERY BYPASS GRAFT  09/30/2001   CABG x 4   IR THORACENTESIS ASP PLEURAL SPACE W/IMG GUIDE  05/19/2020   LAPAROSCOPIC INGUINAL HERNIA REPAIR Bilateral 02/09/2010   MASS EXCISION Left 03/12/2013   Procedure: LEFT INDEX EXCISION MASS AND DEBRIDMENT DISTAL INTERPHALANGEAL JOINT;  Surgeon: Tennis Must, MD;  Location: [facility];  Service: Orthopedics;  Laterality: Left;   PERIPHERAL VASCULAR BALLOON ANGIOPLASTY Left 08/30/2020   Procedure: PERIPHERAL VASCULAR BALLOON ANGIOPLASTY;  Surgeon: Serafina Mitchell, MD;  Location: [facility];  Service: Cardiovascular;  Laterality: Left;  Peroneal   Social History:  reports that he quit smoking about 2 years ago. His smoking use included cigars. He has never used smokeless tobacco. He reports current alcohol use. He reports that he does not use drugs. Family History:  Family History  Problem Relation Age of Onset   Lymphoma Father    Cancer Paternal Grandmother        Colon Cancer   Colon cancer Paternal Grandmother    Colon polyps Neg Hx    Esophageal cancer Neg Hx    Rectal cancer Neg Hx    Stomach cancer Neg Hx    Diabetes Mellitus I Neg Hx    Pancreatic cancer Neg Hx      HOME MEDICATIONS: Allergies as of 01/09/2022       Reactions   Wool Alcohol [lanolin] Hives, Itching   Wool fabric        Medication List        Accurate as of January 08, 2022  9:53 AM. If you have any questions, ask your nurse or doctor.          acetaminophen 500 MG tablet Commonly known as: TYLENOL Take 500 mg by mouth as needed for mild pain or moderate pain.   amoxicillin 500 MG capsule Commonly known as: AMOXIL Take 1 capsule (500 mg total) by mouth every 8 (eight) hours for 5 days.   apixaban 5 MG Tabs  tablet Commonly known as: Eliquis Take 1 tablet (5 mg total) by mouth 2 (two) times daily. PLEASE SCHEDULE APPT WITH ALYSSA FOR FURTHER REFILLS [phone] What changed: additional instructions   aspirin EC 81 MG tablet Take 1 tablet (81 mg total) by mouth daily. What changed: when to take this  atorvastatin 40 MG tablet Commonly known as: LIPITOR TAKE 1 TABLET BY MOUTH EVERY DAY What changed: when to take this   BD Pen Needle Nano 2nd Gen 32G X 4 MM Misc Generic drug: Insulin Pen Needle USE FOR INSULIN   Dexcom G6 Sensor Misc 1 Device by Does not apply route See admin instructions. Change every 10 days   Dexcom G6 Transmitter Misc Use as instructed to check blood sugar   Disposable Brief Large Misc 1 each by Does not apply route as needed.   furosemide 40 MG tablet Commonly known as: LASIX TAKE 1 TABLET BY MOUTH EVERY DAY   lidocaine 5 % ointment Commonly known as: XYLOCAINE Apply 1 Application topically as needed.   losartan 50 MG tablet Commonly known as: COZAAR Take 1 tablet (50 mg total) by mouth daily.   mirtazapine 15 MG tablet Commonly known as: REMERON TAKE 1 TABLET BY MOUTH EVERYDAY AT BEDTIME   multivitamin with minerals tablet Take 1 tablet by mouth daily.   NovoLOG FlexPen 100 UNIT/ML FlexPen Generic drug: insulin aspart Inject 4 Units into the skin 2 (two) times daily. When pump is not attached. What changed: Another medication with the same name was changed. Make sure you understand how and when to take each.   NovoLOG 100 UNIT/ML injection Generic drug: insulin aspart INJECT 50 UNITS INTO THE SKIN SEE ADMIN INSTRUCTIONS. PT LOADS PUMP WITH 50 UNITS EVERY 3 DAYS What changed: See the new instructions.   Omnipod 5 G6 Intro (Gen 5) Kit 1 Device by Does not apply route every 3 (three) days.   PARoxetine 20 MG tablet Commonly known as: PAXIL TAKE 1 TABLET BY MOUTH EVERY DAY What changed: when to take this   spironolactone 25 MG  tablet Commonly known as: ALDACTONE TAKE 1 TABLET BY MOUTH EVERY DAY What changed: when to take this   tamsulosin 0.4 MG Caps capsule Commonly known as: FLOMAX Take 1 capsule (0.4 mg total) by mouth daily after supper.   Voltaren 1 % Gel Generic drug: diclofenac Sodium Apply 1 Application topically as needed (pain).         OBJECTIVE:   Vital Signs: There were no vitals taken for this visit.  Wt Readings from Last 3 Encounters:  01/06/22 167 lb 12.3 oz (76.1 kg)  11/17/21 165 lb (74.8 kg)  10/23/21 177 lb 8 oz (80.5 kg)     Exam: General: Pt appears well and is in NAD  Lungs: Clear with good BS bilat with no rales, rhonchi, or wheezes  Heart: RRR   Extremities: 1+ pretibial edema.   Neuro: MS is good with appropriate affect, pt is alert and Ox3   Dm Foot Exam 11/17/2021 The skin of the feet is without sores or ulcerations but has callus formation and deformed toes The pedal pulses are undetectable  The sensation is decreased  to a screening 5.07, 10 gram monofilament bilaterally     DATA REVIEWED:  Lab Results  Component Value Date   HGBA1C 7.8 (H) 10/23/2021   HGBA1C 9.7 (A) 08/18/2021   HGBA1C 9.2 (A) 04/20/2021    Latest Reference Range & Units 10/23/21 09:36  Sodium 135 - 145 mmol/L 136  Potassium 3.5 - 5.1 mmol/L 3.5  Chloride 98 - 111 mmol/L 103  CO2 22 - 32 mmol/L 28  Glucose 70 - 99 mg/dL 269 (H)  Mean Plasma Glucose mg/dL 177.16  BUN 8 - 23 mg/dL 13  Creatinine 0.61 - 1.24 mg/dL 0.73  Calcium 8.9 -  10.3 mg/dL 8.2 (L)  Anion gap 5 - 15  5  Alkaline Phosphatase 38 - 126 U/L 91  Albumin 3.5 - 5.0 g/dL 2.8 (L)  AST 15 - 41 U/L 18  ALT 0 - 44 U/L 13  Total Protein 6.5 - 8.1 g/dL 6.1 (L)  Total Bilirubin 0.3 - 1.2 mg/dL 0.5  GFR, Estimated >60 mL/min >60    Latest Reference Range & Units 10/23/21 09:36  Glucose 70 - 99 mg/dL 269 (H)  Hemoglobin A1C 4.8 - 5.6 % 7.8 (H)      ASSESSMENT / PLAN / RECOMMENDATIONS:   1) Type 2 Diabetes  Mellitus, Poorly controlled, With retinopathic , neuropathic, retinopathic and macrovascular  complications - Most recent A1c of 7.8 %. Goal A1c < 7.0 %.     -A1c has trended down from 9.7% to 7.8% -In reviewing his pump download, he has been noted with hyperglycemia but it appears that he is having disruption of insulin through the pump . I have advised the pt that he will need to remove the pump and change location as probably the cannula is bent.   - Today I have set  up the temporary basal to start at MN on 11/30th by reducing . Step by step instruction written on the AVS     MEDICATIONS: NovoLog     Pump   Omnipod Settings   Insulin type   Novolog    Basal rate       0000 0.7              I:C ratio       0000 1:1                  Sensitivity       0000  60      Goal       0000  120            EDUCATION / INSTRUCTIONS: BG monitoring instructions: Patient is instructed to check his blood sugars before times a day, before each meal and bedtime. Call [facility] if: BG persistently < 70  I reviewed the Rule of 15 for the treatment of hypoglycemia in detail with the patient. Literature supplied.     2) Diabetic complications:  Eye: Does  have known diabetic retinopathy.  Neuro/ Feet: Does not have known diabetic peripheral neuropathy .  Renal: Patient does not have known baseline CKD. He   is  on an ACEI/ARB at present.       Signed electronically by: Mack Guise, MD  [facility]  [facility] [address] [place], [place] [address] Phone: [phone] FAX: [phone]   CC: [name], [name], PA-C [place] [address] Phone: [phone]  Fax: [phone]  Return to Endocrinology clinic as below: Future Appointments  Date Time Provider [place]  01/09/2022 11:50 AM Kalliopi Coupland, Melanie Crazier, MD [facility] None  01/12/2022  8:45 AM [name], MD [facility] None  01/19/2022  9:00 AM Gardiner Barefoot, DPM [facility]  02/19/2022 10:00 AM Mealor, Yetta Barre, MD [facility]  02/19/2022  3:35 PM Cleaver, Jossie Ng, NP [facility] None

## 2022-01-09 ENCOUNTER — Telehealth: Payer: Self-pay

## 2022-01-09 ENCOUNTER — Telehealth: Payer: Self-pay | Admitting: Cardiovascular Disease

## 2022-01-09 ENCOUNTER — Ambulatory Visit: Payer: HMO | Admitting: Internal Medicine

## 2022-01-09 NOTE — Patient Outreach (Signed)
  Care Coordination TOC Note Transition Care Management Unsuccessful Follow-up Telephone Call  Date of discharge and from where:  01/06/22-[facility]   Attempts:  2nd Attempt  Reason for unsuccessful TCM follow-up call:  No answer/busy     Enzo Montgomery, RN,BSN,CCM [facility] Telephonic Care Management Coordinator Direct Phone: [phone] Toll Free: [phone] Fax: [phone]

## 2022-01-09 NOTE — Patient Outreach (Signed)
  Care Coordination TOC Note Transition Care Management Unsuccessful Follow-up Telephone Call  Date of discharge and from where:  01/06/22-[facility]   Attempts:  3rd Attempt  Reason for unsuccessful TCM follow-up call:  Unable to reach patient    Hetty Blend [facility] Telephonic Care Management Coordinator Direct Phone: [phone] Toll Free: [phone] Fax: [phone]

## 2022-01-09 NOTE — Telephone Encounter (Signed)
Calling with abnormal EKG results. Call transferred

## 2022-01-09 NOTE — Telephone Encounter (Signed)
Patient's mother states she is returning a call, but no one left a message.

## 2022-01-09 NOTE — Telephone Encounter (Signed)
Mother of patient stated patient is taking eliquis '5mg'$  twice daily.

## 2022-01-09 NOTE — Telephone Encounter (Addendum)
Received a call from [employer] that patient had afib for 12 hours (starting last evening 01/08/22 at 6:30 pm). Atrial fib rate of 32-47 bpm. Left message on patient's cell and work phones. Patient's mother called triage. She stated that a 9:30 am today, she spoke with patient, who denied SOB or dizziness, was waiting on [employer] to deliver his breakfast. Mother thinks patient is taking eliquis as prescribed, but will check and advise clinic. Recommended that if patient has any sob/dizziness to go back to the ED. Dr. Claiborne Billings (DOD) shown rhythm and advised of meds patient is taking, and of EP referral. No new orders.

## 2022-01-10 ENCOUNTER — Telehealth: Payer: Self-pay | Admitting: Cardiovascular Disease

## 2022-01-10 NOTE — Telephone Encounter (Signed)
   Cardiac Monitor Alert  Date of alert:  01/10/2022   Patient Name: [name] [name]  DOB: January 15, 1955  MRN: [id number]   [facility] Cardiologist: Evalina Field, MD  [facility] EP:  None    Monitor Information: Long Term Monitor-Live Telemetry [ZioAT]  Reason:   short  run atrial flutter Ordering provider:  Dr Audie Box   Alert Atrial Fibrillation/Flutter This is the 5th alert for this rhythm.  The patient has a hx of Atrial Fibrillation/Flutter.  The patient is currently on anticoagulation.  Anticoagulation medication as of 01/10/2022           apixaban (ELIQUIS) 5 MG TABS tablet Take 1 tablet (5 mg total) by mouth 2 (two) times daily. PLEASE SCHEDULE APPT WITH ALYSSA FOR FURTHER REFILLS [phone]       Next Cardiology Appointment   Date:  02/19/22  Provider:  Dr Audie Box  The patient could NOT be reached by telephone today.  Left message for patient call back  Arrhythmia, symptoms and history reviewed with Dr Gardiner Rhyme.  Plan:  no changes continue wearing monitor    Other: None  Raiford Simmonds, RN  01/10/2022 10:13 AM

## 2022-01-10 NOTE — Telephone Encounter (Signed)
Received  telephone message  Patient ad a short run atrial flutter 38 beats for 60 seconds  at 9;16 pm  Central time  01/09/22  Per representative  strip will be upload to  computer system

## 2022-01-10 NOTE — Progress Notes (Shared)
[facility] Note  01/12/2022     CHIEF COMPLAINT Patient presents for No chief complaint on file.  HISTORY OF PRESENT ILLNESS: [name] [name] is a 67 y.o. male who presents to the clinic today for:    Pt feels like his vision is worse today than it was last time he was here. Had surgery scheduled for OS on 10.26.23, then 11.9.23, but had to cancel x2 -- rescheduled to 11.30.23.  Referring physician: [name], [name], PA-C [address] [place],  [place] [address]  HISTORICAL INFORMATION:   Selected notes from the MEDICAL RECORD NUMBER Referred by Dr. [name] for PDR OU   CURRENT MEDICATIONS: No current outpatient medications on file. (Ophthalmic Drugs)   No current facility-administered medications for this visit. (Ophthalmic Drugs)   Current Outpatient Medications (Other)  Medication Sig   acetaminophen (TYLENOL) 500 MG tablet Take 500 mg by mouth as needed for mild pain or moderate pain.   amoxicillin (AMOXIL) 500 MG capsule Take 1 capsule (500 mg total) by mouth every 8 (eight) hours for 5 days.   apixaban (ELIQUIS) 5 MG TABS tablet Take 1 tablet (5 mg total) by mouth 2 (two) times daily. PLEASE SCHEDULE APPT WITH ALYSSA FOR FURTHER REFILLS [phone] (Patient taking differently: Take 5 mg by mouth 2 (two) times daily.)   aspirin EC 81 MG EC tablet Take 1 tablet (81 mg total) by mouth daily. (Patient taking differently: Take 81 mg by mouth in the morning.)   atorvastatin (LIPITOR) 40 MG tablet TAKE 1 TABLET BY MOUTH EVERY DAY (Patient taking differently: Take 40 mg by mouth in the morning.)   BD PEN NEEDLE NANO 2ND GEN 32G X 4 MM MISC USE FOR INSULIN   Continuous Blood Gluc Sensor (DEXCOM G6 SENSOR) MISC 1 Device by Does not apply route See admin instructions. Change every 10 days   Continuous Blood Gluc Transmit (DEXCOM G6 TRANSMITTER) MISC Use as instructed to check blood sugar   diclofenac Sodium (VOLTAREN) 1 % GEL Apply 1  Application topically as needed (pain).   furosemide (LASIX) 40 MG tablet TAKE 1 TABLET BY MOUTH EVERY DAY   Incontinence Supply Disposable (DISPOSABLE BRIEF LARGE) MISC 1 each by Does not apply route as needed.   insulin aspart (NOVOLOG FLEXPEN) 100 UNIT/ML FlexPen Inject 4 Units into the skin 2 (two) times daily. When pump is not attached. (Patient not taking: Reported on 01/04/2022)   Insulin Disposable Pump (OMNIPOD 5 G6 INTRO, GEN 5,) KIT 1 Device by Does not apply route every 3 (three) days.   lidocaine (XYLOCAINE) 5 % ointment Apply 1 Application topically as needed. (Patient not taking: Reported on 01/04/2022)   losartan (COZAAR) 50 MG tablet Take 1 tablet (50 mg total) by mouth daily.   mirtazapine (REMERON) 15 MG tablet TAKE 1 TABLET BY MOUTH EVERYDAY AT BEDTIME   Multiple Vitamins-Minerals (MULTIVITAMIN WITH MINERALS) tablet Take 1 tablet by mouth daily.   NOVOLOG 100 UNIT/ML injection INJECT 50 UNITS INTO THE SKIN SEE ADMIN INSTRUCTIONS. PT LOADS PUMP WITH 50 UNITS EVERY 3 DAYS (Patient taking differently: Inject 110 Units into the skin See admin instructions. Inject 110 units into pod every three days)   PARoxetine (PAXIL) 20 MG tablet TAKE 1 TABLET BY MOUTH EVERY DAY (Patient taking differently: Take 20 mg by mouth in the morning.)   spironolactone (ALDACTONE) 25 MG tablet TAKE 1 TABLET BY MOUTH EVERY DAY (Patient taking differently: Take 25 mg by mouth in the  morning.)   tamsulosin (FLOMAX) 0.4 MG CAPS capsule Take 1 capsule (0.4 mg total) by mouth daily after supper.   No current facility-administered medications for this visit. (Other)   REVIEW OF SYSTEMS:    ALLERGIES Allergies  Allergen Reactions   Wool Alcohol [Lanolin] Hives and Itching    Wool fabric    PAST MEDICAL HISTORY Past Medical History:  Diagnosis Date   Anxiety    Arthritis    Cataract    CHF (congestive heart failure) ([place])    Constipation    pt states goes EOD- hard stools    Coronary artery disease     Depression    Diabetes mellitus without complication (HCC)    Diabetic retinopathy ([name])    DKA (diabetic ketoacidoses)    Hyperlipidemia    Hypertension    off meds   Hypertensive retinopathy    Postherpetic neuralgia 08/11/2021   Speculative diagnosis   Psoriasis    Tubular adenoma of colon 03/2019   Past Surgical History:  Procedure Laterality Date   ABDOMINAL AORTOGRAM W/LOWER EXTREMITY N/A 08/30/2020   Procedure: ABDOMINAL AORTOGRAM W/LOWER EXTREMITY;  Surgeon: Serafina Mitchell, MD;  Location: [facility];  Service: Cardiovascular;  Laterality: N/A;   ABDOMINAL AORTOGRAM W/LOWER EXTREMITY N/A 11/15/2020   Procedure: ABDOMINAL AORTOGRAM W/LOWER EXTREMITY;  Surgeon: Serafina Mitchell, MD;  Location: [place] [facility];  Service: Cardiovascular;  Laterality: N/A;   ANKLE SURGERY     right   CARDIAC CATHETERIZATION  09/25/2001   COLONOSCOPY     ~10 yr ago- normal  per pt    CORONARY ARTERY BYPASS GRAFT  09/30/2001   CABG x 4   IR THORACENTESIS ASP PLEURAL SPACE W/IMG GUIDE  05/19/2020   LAPAROSCOPIC INGUINAL HERNIA REPAIR Bilateral 02/09/2010   MASS EXCISION Left 03/12/2013   Procedure: LEFT INDEX EXCISION MASS AND DEBRIDMENT DISTAL INTERPHALANGEAL JOINT;  Surgeon: Tennis Must, MD;  Location: [place];  Service: Orthopedics;  Laterality: Left;   PERIPHERAL VASCULAR BALLOON ANGIOPLASTY Left 08/30/2020   Procedure: PERIPHERAL VASCULAR BALLOON ANGIOPLASTY;  Surgeon: Serafina Mitchell, MD;  Location: [facility];  Service: Cardiovascular;  Laterality: Left;  Peroneal   FAMILY HISTORY Family History  Problem Relation Age of Onset   Lymphoma Father    Cancer Paternal Grandmother        Colon Cancer   Colon cancer Paternal Grandmother    Colon polyps Neg Hx    Esophageal cancer Neg Hx    Rectal cancer Neg Hx    Stomach cancer Neg Hx    Diabetes Mellitus I Neg Hx    Pancreatic cancer Neg Hx    SOCIAL HISTORY Social History   Tobacco Use   Smoking  status: Former    Types: Cigars    Quit date: 2022    Years since quitting: 2.0   Smokeless tobacco: Never  Vaping Use   Vaping Use: Never used  Substance Use Topics   Alcohol use: Yes    Comment: about once a year   Drug use: No       OPHTHALMIC EXAM: Not recorded    IMAGING AND PROCEDURES  Imaging and Procedures for 01/12/2022          ASSESSMENT/PLAN:    ICD-10-CM   1. Proliferative diabetic retinopathy of both eyes with macular edema associated with type 2 diabetes mellitus ([name])  W10.9323     2. Vitreous hemorrhage of left eye ([place])  H43.12  3. Essential hypertension  I10     4. Hypertensive retinopathy of both eyes  H35.033     5. Combined forms of age-related cataract of both eyes  H25.813       1. Severe proliferative diabetic retinopathy, both eyes  - last A1c 9.7 on 08.18.23; 9.2 on 4.20.23; A1c >15% on 10.11.22 - s/p PRP OS (11.10.22)  - s/p PRP OD (12.02.22)  - s/p IVA OS #1 (01.18.23) for VH, #2 (03.17.23), #3 (04.14.23), #4 (05.12.23), #5 (06.09.23), #6 (07.07.23), #7 (08.10.23), #8 (09.08.23), #9 (10.06.23), #10 (11.10.23)  - s/p IVA OD #1 (01.27.23) for macular edema/NVD, #2 (03.17.23), #3 (04.14.23), #4 (05.12.23), #5 (06.09.23), #6 (07.07.23), #7 (08.10.23), #8 (09.08.23), #9 (10.06.23), #10 (11.10.23) - pt reports improved BG control -- on insulin pump - exam shows extensive neovascularization, fibrosis and hemorrhage OU (OS >>> OD)  - OD with improved NVD / VH - OS with persistent VH - BCVA 20/60 OD; 20/300 OS -- worse OU - FA (11.09.22) shows areas of vascular non-perfusion OU, +NVD OU and +NVE OU - OCT shows OD: stable improvement in cystic changes temporal macula and fovea, persistent PRF with traction; OS: interval increase in vitreous opacities, cpersistent vitreous traction greatest along proximal arcades and disc - discussed findings, severity of his disease, prognosis and likely need for extensive treatments -- PRP OU, multiple  rounds of anti-VEGF therapy OU and possible surgery - recommend IVA  - pt wishes to proceed with injections - RBA of procedure discussed, questions answered - informed consent obtained and signed, 01.18.23 - see procedure note - f/u  2. Vitreous hemorrhage OS  - acute increase in VH OS (late Dec 2022) per pt report  - s/p multiple IVA OS as above  - VH remains present and now with increasing vitreous traction and fibrosis  - recommend PPV w/ membrane peel OS under general anesthesia - pt wishes to proceed with surgery - surgery scheduled for Thursday, November 30, 11:30 am, [facility] OR 8 - f/u  3,4. Hypertensive retinopathy OU - discussed importance of tight BP control - monitor  5. Mixed Cataract OU - The symptoms of cataract, surgical options, and treatments and risks were discussed with patient - discussed diagnosis and progression - monitor  Ophthalmic Meds Ordered this visit:  No orders of the defined types were placed in this encounter.    No follow-ups on file.  There are no Patient Instructions on file for this visit.  Explained the diagnoses, plan, and follow up with the patient and they expressed understanding.  Patient expressed understanding of the importance of proper follow up care.   This document serves as a record of services personally performed by [name], MD, PhD. It was created on their behalf by Roselee Nova, COMT. The creation of this record is the provider's dictation and/or activities during the visit.  Electronically signed by: Roselee Nova, COMT 01/10/22 10:56 AM    [name], M.D., Ph.D. Diseases & Surgery of the Retina and Vitreous [facility]: M myopia (nearsighted); A astigmatism; H hyperopia (farsighted); P presbyopia; Mrx spectacle prescription;  CTL contact lenses; OD right eye; OS left eye; OU both eyes  XT exotropia; ET esotropia; PEK punctate epithelial keratitis; PEE punctate  epithelial erosions; DES dry eye syndrome; MGD meibomian gland dysfunction; ATs artificial tears; PFAT's preservative free artificial tears; [place] nuclear sclerotic cataract; PSC posterior subcapsular cataract; ERM epi-retinal membrane; PVD posterior vitreous detachment; RD retinal detachment;  DM diabetes mellitus; DR diabetic retinopathy; NPDR non-proliferative diabetic retinopathy; PDR proliferative diabetic retinopathy; CSME clinically significant macular edema; DME diabetic macular edema; dbh dot blot hemorrhages; CWS cotton wool spot; POAG primary open angle glaucoma; C/D cup-to-disc ratio; HVF humphrey visual field; GVF goldmann visual field; OCT optical coherence tomography; IOP intraocular pressure; BRVO Branch retinal vein occlusion; CRVO central retinal vein occlusion; CRAO central retinal artery occlusion; BRAO branch retinal artery occlusion; RT retinal tear; SB scleral buckle; PPV pars plana vitrectomy; VH Vitreous hemorrhage; PRP panretinal laser photocoagulation; IVK intravitreal kenalog; VMT vitreomacular traction; MH Macular hole;  NVD neovascularization of the disc; NVE neovascularization elsewhere; AREDS age related eye disease study; ARMD age related macular degeneration; POAG primary open angle glaucoma; EBMD epithelial/anterior basement membrane dystrophy; ACIOL anterior chamber intraocular lens; IOL intraocular lens; PCIOL posterior chamber intraocular lens; Phaco/IOL phacoemulsification with intraocular lens placement; [place] photorefractive keratectomy; LASIK laser assisted in situ keratomileusis; HTN hypertension; DM diabetes mellitus; COPD chronic obstructive pulmonary disease

## 2022-01-10 NOTE — Telephone Encounter (Signed)
Abnormal results 

## 2022-01-12 ENCOUNTER — Telehealth: Payer: Self-pay | Admitting: Cardiovascular Disease

## 2022-01-12 ENCOUNTER — Encounter (INDEPENDENT_AMBULATORY_CARE_PROVIDER_SITE_OTHER): Payer: HMO | Admitting: Ophthalmology

## 2022-01-12 DIAGNOSIS — H25813 Combined forms of age-related cataract, bilateral: Secondary | ICD-10-CM

## 2022-01-12 DIAGNOSIS — E113513 Type 2 diabetes mellitus with proliferative diabetic retinopathy with macular edema, bilateral: Secondary | ICD-10-CM

## 2022-01-12 DIAGNOSIS — I10 Essential (primary) hypertension: Secondary | ICD-10-CM

## 2022-01-12 DIAGNOSIS — H4312 Vitreous hemorrhage, left eye: Secondary | ICD-10-CM

## 2022-01-12 DIAGNOSIS — H35033 Hypertensive retinopathy, bilateral: Secondary | ICD-10-CM

## 2022-01-12 NOTE — Telephone Encounter (Signed)
[name] [name] calling with abnormal EKG results

## 2022-01-12 NOTE — Telephone Encounter (Signed)
2 episode slope a flutter 39 bpm at 7:27 am, 38 bpm at 7:43 am.   Attempted to call pt. Call went straight to voicemail. Left a message for pt to return the call.  Dr. Audie Box made aware of these events. No further orders given at this time.

## 2022-01-13 ENCOUNTER — Telehealth: Payer: Self-pay | Admitting: Home Health

## 2022-01-13 NOTE — Telephone Encounter (Signed)
[employer] called slowed A flutter with ventricular rate 39bpm for 60 seconds at 0944am 01/13/22, patient was called and was asymptomatic at the time.   Chart reviewed, appears this has been a known issue for the patient and has been reported multiple times. Not on any AVN blocking agents. No further intervention.

## 2022-01-14 ENCOUNTER — Telehealth: Payer: Self-pay | Admitting: Home Health

## 2022-01-14 NOTE — Telephone Encounter (Signed)
Patient returned call, states he feels fine, denied any chest pain, SOB, dizziness, syncope. He states he has so many health issues, does not know what to do sometime. Verified he is not on any AVN blocking agents. Advised the patient continue self monitor symptoms of bradycardia, call back if there is concern, he states he does not know how to do that, advised the patient go to the ER if above mentioned symptoms occurs, he agreed.

## 2022-01-14 NOTE — Telephone Encounter (Signed)
Discussed with Dr. Audie Box today, [place] monitor company has been paging frequently since 01/07/2022 for slowed atrial flutter episodes 30-50s, patient has remained asymptomatic, this is a known chronic condition, he is not on any rate of acting agent.  Dr. Audie Box is okay for [employer] monitor company stopped reporting slowed atrial fibrillation/flutter.  [employer] monitor company today, informed the representative to cancel alerts for slowed atrial fibrillation/flutter, reports back only if ventricular rate <30bpm, patient has symptoms from bradycardia, high-grade AV block, or long pauses detected.

## 2022-01-14 NOTE — Telephone Encounter (Signed)
  [employer] called after hour today reports patient had multiple episode of slowed A flutter with ventricular rate 37-45 bpm from 01/13/22 840pm to 723 am 01/14/22.  Called the patient, voice mail left for call back, called  work number in chart which was his mom, asked his mom to tell him answer his phone or call us back.   Appears this has been recurrently reported over the past week, see multiple phone encounters, he had remains asymptomatic in the past with known slowed A flutter message sent to Dr Audie Box regarding if we can stop the alert on slowed A flutter unless there is high grade AVB or long pauses.

## 2022-01-15 ENCOUNTER — Other Ambulatory Visit: Payer: Self-pay | Admitting: Internal Medicine

## 2022-01-15 ENCOUNTER — Telehealth: Payer: Self-pay | Admitting: Physician Assistant

## 2022-01-15 DIAGNOSIS — E1165 Type 2 diabetes mellitus with hyperglycemia: Secondary | ICD-10-CM

## 2022-01-15 NOTE — Telephone Encounter (Signed)
Copied from [place] [id number]. Topic: Medicare AWV >> Jan 15, 2022  9:33 AM Gillis Santa wrote: Reason for CRM: LVM PATIENT TO CALL [place]

## 2022-01-19 ENCOUNTER — Ambulatory Visit: Payer: HMO | Admitting: Podiatry

## 2022-01-24 NOTE — Progress Notes (Signed)
[facility] Quality [facility] Note  Name: [name] [name] Date of Birth: 08/30/1955 MRN: [id number] Date: 01/24/2022  [facility] Quality Team has reviewed this patient's chart, please see recommendations below:  [facility] Quality Other; (TRC/MRP- MEDICATION RECONCILIATION POST DISCHARGE. [place] ON 01/06/2022. PATIENT NEEDS A FOLLOW UP OFFICE VISIT AND FULL MEDICATION RECONCILIATION BY A PRESCRIBING PROVIDER BEFORE 02/05/2022 FOR GAP CLOSURE.)

## 2022-01-30 NOTE — Addendum Note (Signed)
Encounter addended by: Markus Daft A on: 01/30/2022 5:03 PM  Actions taken: Imaging Exam ended

## 2022-02-13 ENCOUNTER — Other Ambulatory Visit: Payer: Self-pay | Admitting: Physician Assistant

## 2022-02-13 DIAGNOSIS — I480 Paroxysmal atrial fibrillation: Secondary | ICD-10-CM

## 2022-02-18 NOTE — Progress Notes (Deleted)
Electrophysiology Office Note:    Date:  02/18/2022   ID:  [name] [name], DOB 03-27-1955, MRN [id number]  PCP:  [name], [name], [place] Providers Cardiologist:  Evalina Field, MD { Click to update primary MD,subspecialty MD or APP then REFRESH:1}    Referring MD: [name], [name], PA-C   History of Present Illness:    [name] [name] is a 67 y.o. male with a hx listed below, significant for CAD status post four-vessel CABG on 09/30/2001, PAD, HTN, HLD, DM2 on chronic insulin therapy, PAF and HFpEF , referred for arrhythmia management.  He was admitted to the hospitalist service in January with hallucinations and incidentally noted to have sinus bradycardia with rates in the 30s and 40s.  Cardiology was consulted and a monitor was placed.    Past Medical History:  Diagnosis Date   Anxiety    Arthritis    Cataract    CHF (congestive heart failure) ([place])    Constipation    pt states goes EOD- hard stools    Coronary artery disease    Depression    Diabetes mellitus without complication (HCC)    Diabetic retinopathy ([place])    DKA (diabetic ketoacidoses)    Hyperlipidemia    Hypertension    off meds   Hypertensive retinopathy    Postherpetic neuralgia 08/11/2021   Speculative diagnosis   Psoriasis    Tubular adenoma of colon 03/2019    Past Surgical History:  Procedure Laterality Date   ABDOMINAL AORTOGRAM W/LOWER EXTREMITY N/A 08/30/2020   Procedure: ABDOMINAL AORTOGRAM W/LOWER EXTREMITY;  Surgeon: Serafina Mitchell, MD;  Location: [facility];  Service: Cardiovascular;  Laterality: N/A;   ABDOMINAL AORTOGRAM W/LOWER EXTREMITY N/A 11/15/2020   Procedure: ABDOMINAL AORTOGRAM W/LOWER EXTREMITY;  Surgeon: Serafina Mitchell, MD;  Location: [facility];  Service: Cardiovascular;  Laterality: N/A;   ANKLE SURGERY     right   CARDIAC CATHETERIZATION  09/25/2001   COLONOSCOPY     ~10 yr ago- normal  per pt    CORONARY ARTERY  BYPASS GRAFT  09/30/2001   CABG x 4   IR THORACENTESIS ASP PLEURAL SPACE W/IMG GUIDE  05/19/2020   LAPAROSCOPIC INGUINAL HERNIA REPAIR Bilateral 02/09/2010   MASS EXCISION Left 03/12/2013   Procedure: LEFT INDEX EXCISION MASS AND DEBRIDMENT DISTAL INTERPHALANGEAL JOINT;  Surgeon: Tennis Must, MD;  Location: [facility];  Service: Orthopedics;  Laterality: Left;   PERIPHERAL VASCULAR BALLOON ANGIOPLASTY Left 08/30/2020   Procedure: PERIPHERAL VASCULAR BALLOON ANGIOPLASTY;  Surgeon: Serafina Mitchell, MD;  Location: [facility];  Service: Cardiovascular;  Laterality: Left;  Peroneal    Current Medications: No outpatient medications have been marked as taking for the 02/19/22 encounter (Appointment) with Keyun[place] Coco, Yetta Barre, MD.     Allergies:   Wool alcohol [lanolin]   Social History   Socioeconomic History   Marital status: Divorced    Spouse name: Not on file   Number of children: 4   Years of education: 12   Highest education level: 12th grade  Occupational History   Occupation: Retired  Tobacco Use   Smoking status: Former    Types: Cigars    Quit date: 2022    Years since quitting: 2.1   Smokeless tobacco: Never  Vaping Use   Vaping Use: Never used  Substance and Sexual Activity   Alcohol use: Yes    Comment: about once a year  Drug use: No   Sexual activity: Yes  Other Topics Concern   Not on file  Social History Narrative   Lives with his adult son [name] [name].   Has 4 children, 2 sons- both live in [place], 2 daughters one lives in [place], [place] and youngest daughter attends [employer].    Fiance lives in  [place], [place]   Social Determinants of Health   Financial Resource Strain: Unknown (10/23/2019)   Overall Financial Resource Strain (CARDIA)    Difficulty of Paying Living Expenses: Patient refused  Food Insecurity: No Food Insecurity (01/03/2022)   Hunger Vital Sign    Worried About Running Out of Food in the Last Year: Never  true    Ran Out of Food in the Last Year: Never true  Transportation Needs: No Transportation Needs (01/03/2022)   PRAPARE - Hydrologist (Medical): No    Lack of Transportation (Non-Medical): No  Physical Activity: Inactive (10/23/2019)   Exercise Vital Sign    Days of Exercise per Week: 0 days    Minutes of Exercise per Session: 0 min  Stress: Stress Concern Present (10/23/2019)   [place]    Feeling of Stress : Very much  Social Connections: Socially Isolated (10/23/2019)   Social Connection and Isolation Panel [NHANES]    Frequency of Communication with Friends and Family: More than three times a week    Frequency of Social Gatherings with Friends and Family: More than three times a week    Attends Religious Services: Never    Marine scientist or Organizations: No    Attends Music therapist: Never    Marital Status: Divorced     Family History: The patient's family history includes Cancer in his paternal grandmother; Colon cancer in his paternal grandmother; Lymphoma in his father. There is no history of Colon polyps, Esophageal cancer, Rectal cancer, Stomach cancer, Diabetes Mellitus I, or Pancreatic cancer.  ROS:   Please see the history of present illness.    All other systems reviewed and are negative.  EKGs/Labs/Other Studies Reviewed Today:     Cardiac monitor: AF with 100% burden, HR 26-76 bpm, avg 49. Longest pause 3.4 seconds. Minimum heart rate occurred at about 7:25 AM. Pauses occurred at about 10:25 AM.  TTE: 01/04/2022 EF 60-65% with moderate asymmetric LVH. Normal left atrial size.  EKG:  Last EKG results: ***   Recent Labs: 07/17/2021: B Natriuretic Peptide 146.2 01/03/2022: TSH 4.051 01/04/2022: ALT 9; Magnesium 1.8 01/06/2022: BUN 17; Creatinine, Ser 0.90; Hemoglobin 12.0; Platelets 175; Potassium 3.5; Sodium 133     Physical Exam:    VS:   There were no vitals taken for this visit.    Wt Readings from Last 3 Encounters:  01/06/22 167 lb 12.3 oz (76.1 kg)  11/17/21 165 lb (74.8 kg)  10/23/21 177 lb 8 oz (80.5 kg)     GEN: *** Well nourished, well developed in no acute distress CARDIAC: ***RRR, no murmurs, rubs, gallops RESPIRATORY:  Normal work of breathing MUSCULOSKELETAL: *** edema    ASSESSMENT & PLAN:    Atrial fibrillation with slow ventricular response: HTN: on furosemide 30m PO daily, amlodipine 151mdaily, losartan 5071maily        Medication Adjustments/Labs and Tests Ordered: Current medicines are reviewed at length with the patient today.  Concerns regarding medicines are outlined above.  No orders of the defined types were placed in this encounter.  No orders of the defined types were placed in this encounter.    Signed, Melida Quitter, MD  02/18/2022 3:49 PM    [place]

## 2022-02-18 NOTE — Progress Notes (Deleted)
Cardiology Office Note:    Date:  02/18/2022   ID:  [name] [name], DOB 1955/03/20, MRN [id number]  PCP:  Fredirick Lathe, [place] Cardiologist:  Evalina Field, MD {  Referring MD: Fredirick Lathe, PA-C   No chief complaint on file. ***  History of Present Illness:    [name] [name] is a 67 y.o. male with a hx of CAD s/p 4V CABG in 2003, PAD, HTN, HLD, type 2 DM on insulin, HFpEF, and paroxysmal atrial fibrillation. Patient is followed by Dr. Audie Box, and presents today for ***  Per chart review, patient had a cardiac catheterization with Dr. Martinique in 2003 that showed a totally occluded LAD, diffusely diseased RCA with collateral filling to LAD, high-grade stenosis of D1, small D2 with diffuse disease and a 60% proximal left circumflex lesion.   a 4V CABG in 09/2001 with LIMA-LAD, RIMA-distal RCA, left radial-left circumflex artery, reverse SVG to D1. After his CABG, patient was lost to cardiology follow up for several years.   Past Medical History:  Diagnosis Date   Anxiety    Arthritis    Cataract    CHF (congestive heart failure) ([place])    Constipation    pt states goes EOD- hard stools    Coronary artery disease    Depression    Diabetes mellitus without complication (HCC)    Diabetic retinopathy ([place])    DKA (diabetic ketoacidoses)    Hyperlipidemia    Hypertension    off meds   Hypertensive retinopathy    Postherpetic neuralgia 08/11/2021   Speculative diagnosis   Psoriasis    Tubular adenoma of colon 03/2019    Past Surgical History:  Procedure Laterality Date   ABDOMINAL AORTOGRAM W/LOWER EXTREMITY N/A 08/30/2020   Procedure: ABDOMINAL AORTOGRAM W/LOWER EXTREMITY;  Surgeon: Serafina Mitchell, MD;  Location: [facility];  Service: Cardiovascular;  Laterality: N/A;   ABDOMINAL AORTOGRAM W/LOWER EXTREMITY N/A 11/15/2020   Procedure: ABDOMINAL AORTOGRAM W/LOWER EXTREMITY;  Surgeon: Serafina Mitchell, MD;   Location: [facility];  Service: Cardiovascular;  Laterality: N/A;   ANKLE SURGERY     right   CARDIAC CATHETERIZATION  09/25/2001   COLONOSCOPY     ~10 yr ago- normal  per pt    CORONARY ARTERY BYPASS GRAFT  09/30/2001   CABG x 4   IR THORACENTESIS ASP PLEURAL SPACE W/IMG GUIDE  05/19/2020   LAPAROSCOPIC INGUINAL HERNIA REPAIR Bilateral 02/09/2010   MASS EXCISION Left 03/12/2013   Procedure: LEFT INDEX EXCISION MASS AND DEBRIDMENT DISTAL INTERPHALANGEAL JOINT;  Surgeon: Tennis Must, MD;  Location: [facility];  Service: Orthopedics;  Laterality: Left;   PERIPHERAL VASCULAR BALLOON ANGIOPLASTY Left 08/30/2020   Procedure: PERIPHERAL VASCULAR BALLOON ANGIOPLASTY;  Surgeon: Serafina Mitchell, MD;  Location: [facility];  Service: Cardiovascular;  Laterality: Left;  Peroneal    Current Medications: No outpatient medications have been marked as taking for the 02/19/22 encounter (Appointment) with Deberah Pelton, NP.     Allergies:   Wool alcohol [lanolin]   Social History   Socioeconomic History   Marital status: Divorced    Spouse name: Not on file   Number of children: 4   Years of education: 12   Highest education level: 12th grade  Occupational History   Occupation: Retired  Tobacco Use   Smoking status: Former    Types: Cigars    Quit date: 2022  Years since quitting: 2.1   Smokeless tobacco: Never  Vaping Use   Vaping Use: Never used  Substance and Sexual Activity   Alcohol use: Yes    Comment: about once a year   Drug use: No   Sexual activity: Yes  Other Topics Concern   Not on file  Social History Narrative   Lives with his adult son [name] [name].   Has 4 children, 2 sons- both live in [place], 2 daughters one lives in [place], [place] and youngest daughter attends [employer].    Fiance lives in  [place], [place]   Social Determinants of Health   Financial Resource Strain: Unknown (10/23/2019)   Overall Financial Resource  Strain (CARDIA)    Difficulty of Paying Living Expenses: Patient refused  Food Insecurity: No Food Insecurity (01/03/2022)   Hunger Vital Sign    Worried About Running Out of Food in the Last Year: Never true    Ran Out of Food in the Last Year: Never true  Transportation Needs: No Transportation Needs (01/03/2022)   PRAPARE - Hydrologist (Medical): No    Lack of Transportation (Non-Medical): No  Physical Activity: Inactive (10/23/2019)   Exercise Vital Sign    Days of Exercise per Week: 0 days    Minutes of Exercise per Session: 0 min  Stress: Stress Concern Present (10/23/2019)   [place]    Feeling of Stress : Very much  Social Connections: Socially Isolated (10/23/2019)   Social Connection and Isolation Panel [NHANES]    Frequency of Communication with Friends and Family: More than three times a week    Frequency of Social Gatherings with Friends and Family: More than three times a week    Attends Religious Services: Never    Marine scientist or Organizations: No    Attends Music therapist: Never    Marital Status: Divorced     Family History: The patient's ***family history includes Cancer in his paternal grandmother; Colon cancer in his paternal grandmother; Lymphoma in his father. There is no history of Colon polyps, Esophageal cancer, Rectal cancer, Stomach cancer, Diabetes Mellitus I, or Pancreatic cancer.  ROS:   Please see the history of present illness.    *** All other systems reviewed and are negative.  EKGs/Labs/Other Studies Reviewed:    The following studies were reviewed today: ***  EKG:  EKG is *** ordered today.  The ekg ordered today demonstrates ***  Recent Labs: 07/17/2021: B Natriuretic Peptide 146.2 01/03/2022: TSH 4.051 01/04/2022: ALT 9; Magnesium 1.8 01/06/2022: BUN 17; Creatinine, Ser 0.90; Hemoglobin 12.0; Platelets 175; Potassium 3.5;  Sodium 133  Recent Lipid Panel    Component Value Date/Time   CHOL 78 02/16/2021 0434   TRIG 26 02/16/2021 0434   HDL 39 (L) 02/16/2021 0434   CHOLHDL 2.0 02/16/2021 0434   VLDL 5 02/16/2021 0434   LDLCALC 34 02/16/2021 0434   LDLDIRECT 37.0 03/16/2020 0958     Risk Assessment/Calculations:   {Does this patient have ATRIAL FIBRILLATION?[phone]}  No BP recorded.  {Refresh Note OR Click here to enter BP  :1}***         Physical Exam:    VS:  There were no vitals taken for this visit.    Wt Readings from Last 3 Encounters:  01/06/22 167 lb 12.3 oz (76.1 kg)  11/17/21 165 lb (74.8 kg)  10/23/21 177 lb 8 oz (80.5 kg)  GEN: *** Well nourished, well developed in no acute distress HEENT: Normal NECK: No JVD; No carotid bruits LYMPHATICS: No lymphadenopathy CARDIAC: ***RRR, no murmurs, rubs, gallops RESPIRATORY:  Clear to auscultation without rales, wheezing or rhonchi  ABDOMEN: Soft, non-tender, non-distended MUSCULOSKELETAL:  No edema; No deformity  SKIN: Warm and dry NEUROLOGIC:  Alert and oriented x 3 PSYCHIATRIC:  Normal affect   ASSESSMENT:    No diagnosis found. PLAN:    In order of problems listed above:  ***      {Are you ordering a CV Procedure (e.g. stress test, cath, DCCV, TEE, etc)?   Press F2        :[id number]    Medication Adjustments/Labs and Tests Ordered: Current medicines are reviewed at length with the patient today.  Concerns regarding medicines are outlined above.  No orders of the defined types were placed in this encounter.  No orders of the defined types were placed in this encounter.   There are no Patient Instructions on file for this visit.   Signed, Margie Billet, PA-C  02/18/2022 4:42 PM    [place]

## 2022-02-19 ENCOUNTER — Institutional Professional Consult (permissible substitution): Payer: PPO | Admitting: Cardiovascular Disease

## 2022-02-19 ENCOUNTER — Ambulatory Visit: Payer: PPO | Admitting: General Practice

## 2022-02-19 ENCOUNTER — Telehealth: Payer: Self-pay | Admitting: Physician Assistant

## 2022-02-19 DIAGNOSIS — R001 Bradycardia, unspecified: Secondary | ICD-10-CM

## 2022-02-19 DIAGNOSIS — I4891 Unspecified atrial fibrillation: Secondary | ICD-10-CM

## 2022-02-19 NOTE — Telephone Encounter (Signed)
[name] [name] with [facility] states the [facility] NP is recommending adding Melatonin for Patient.  If agreeable, [name] [name] requests Patient be called.

## 2022-02-19 NOTE — Telephone Encounter (Signed)
Returned [facility] call and lvm with office cb number

## 2022-02-22 NOTE — Progress Notes (Signed)
[facility] Note  02/23/2022     CHIEF COMPLAINT Patient presents for Retina Follow Up  HISTORY OF PRESENT ILLNESS: [name] [name] is a 67 y.o. male who presents to the clinic today for:   HPI     Retina Follow Up   Patient presents with  Diabetic Retinopathy.  In both eyes.  This started 15 weeks ago.  I, the attending physician,  performed the HPI with the patient and updated documentation appropriately.        Comments   Patient here for 15 weeks retina follow for PDR OU. Patient states vision OS still pretty bad. OD is ok. No eye pain.       Last edited by [name], MD on 02/24/2022 10:35 AM.    Pt is delayed to follow up from 11.10.23, pt is unsure why it has been so long, pt was recently hospitalized for fluid on his lungs   Referring physician: [name], [name], PA-C [address] [place],  [place] [address]  HISTORICAL INFORMATION:   Selected notes from the MEDICAL RECORD NUMBER Referred by Dr. [name] for PDR OU   CURRENT MEDICATIONS: No current outpatient medications on file. (Ophthalmic Drugs)   No current facility-administered medications for this visit. (Ophthalmic Drugs)   Current Outpatient Medications (Other)  Medication Sig   acetaminophen (TYLENOL) 500 MG tablet Take 500 mg by mouth as needed for mild pain or moderate pain.   apixaban (ELIQUIS) 5 MG TABS tablet Take 1 tablet (5 mg total) by mouth 2 (two) times daily. PLEASE SCHEDULE APPT WITH ALYSSA FOR FURTHER REFILLS [phone] (Patient taking differently: Take 5 mg by mouth 2 (two) times daily.)   aspirin EC 81 MG EC tablet Take 1 tablet (81 mg total) by mouth daily. (Patient taking differently: Take 81 mg by mouth in the morning.)   atorvastatin (LIPITOR) 40 MG tablet TAKE 1 TABLET BY MOUTH EVERY DAY (Patient taking differently: Take 40 mg by mouth in the morning.)   BD PEN NEEDLE NANO 2ND GEN 32G X 4 MM MISC USE FOR INSULIN   Continuous Blood  Gluc Sensor (DEXCOM G6 SENSOR) MISC 1 Device by Does not apply route See admin instructions. Change every 10 days   Continuous Blood Gluc Transmit (DEXCOM G6 TRANSMITTER) MISC Use as instructed to check blood sugar   diclofenac Sodium (VOLTAREN) 1 % GEL Apply 1 Application topically as needed (pain).   Incontinence Supply Disposable (DISPOSABLE BRIEF LARGE) MISC 1 each by Does not apply route as needed.   Insulin Disposable Pump (OMNIPOD 5 G6 INTRO, GEN 5,) KIT 1 Device by Does not apply route every 3 (three) days.   Insulin Disposable Pump (OMNIPOD 5 G6 POD, GEN 5,) MISC APPLY EVERY 3 (THREE) DAYS.   losartan (COZAAR) 50 MG tablet Take 1 tablet (50 mg total) by mouth daily.   mirtazapine (REMERON) 15 MG tablet TAKE 1 TABLET BY MOUTH EVERYDAY AT BEDTIME   Multiple Vitamins-Minerals (MULTIVITAMIN WITH MINERALS) tablet Take 1 tablet by mouth daily.   NOVOLOG 100 UNIT/ML injection INJECT 50 UNITS INTO THE SKIN SEE ADMIN INSTRUCTIONS. PT LOADS PUMP WITH 50 UNITS EVERY 3 DAYS (Patient taking differently: Inject 110 Units into the skin See admin instructions. Inject 110 units into pod every three days)   PARoxetine (PAXIL) 20 MG tablet TAKE 1 TABLET BY MOUTH EVERY DAY (Patient taking differently: Take 20 mg by mouth in the morning.)   spironolactone (ALDACTONE) 25 MG tablet TAKE  1 TABLET BY MOUTH EVERY DAY (Patient taking differently: Take 25 mg by mouth in the morning.)   furosemide (LASIX) 40 MG tablet TAKE 1 TABLET BY MOUTH EVERY DAY   insulin aspart (NOVOLOG FLEXPEN) 100 UNIT/ML FlexPen Inject 4 Units into the skin 2 (two) times daily. When pump is not attached. (Patient not taking: Reported on 01/04/2022)   lidocaine (XYLOCAINE) 5 % ointment Apply 1 Application topically as needed. (Patient not taking: Reported on 01/04/2022)   No current facility-administered medications for this visit. (Other)   REVIEW OF SYSTEMS: ROS   Positive for: Neurological, Skin, Musculoskeletal, Endocrine, Cardiovascular,  Eyes Negative for: Constitutional, Gastrointestinal, Genitourinary, HENT, Respiratory, Psychiatric, Allergic/Imm, Heme/Lymph Last edited by Theodore Demark, COA on 02/23/2022  9:18 AM.       ALLERGIES Allergies  Allergen Reactions   Wool Alcohol [Lanolin] Hives and Itching    Wool fabric   PAST MEDICAL HISTORY Past Medical History:  Diagnosis Date   Anxiety    Arthritis    Cataract    CHF (congestive heart failure) ([place])    Constipation    pt states goes EOD- hard stools    Coronary artery disease    Depression    Diabetes mellitus without complication (HCC)    Diabetic retinopathy ([place])    DKA (diabetic ketoacidoses)    Hyperlipidemia    Hypertension    off meds   Hypertensive retinopathy    Postherpetic neuralgia 08/11/2021   Speculative diagnosis   Psoriasis    Tubular adenoma of colon 03/2019   Past Surgical History:  Procedure Laterality Date   ABDOMINAL AORTOGRAM W/LOWER EXTREMITY N/A 08/30/2020   Procedure: ABDOMINAL AORTOGRAM W/LOWER EXTREMITY;  Surgeon: Serafina Mitchell, MD;  Location: [facility];  Service: Cardiovascular;  Laterality: N/A;   ABDOMINAL AORTOGRAM W/LOWER EXTREMITY N/A 11/15/2020   Procedure: ABDOMINAL AORTOGRAM W/LOWER EXTREMITY;  Surgeon: Serafina Mitchell, MD;  Location: [facility];  Service: Cardiovascular;  Laterality: N/A;   ANKLE SURGERY     right   CARDIAC CATHETERIZATION  09/25/2001   COLONOSCOPY     ~10 yr ago- normal  per pt    CORONARY ARTERY BYPASS GRAFT  09/30/2001   CABG x 4   IR THORACENTESIS ASP PLEURAL SPACE W/IMG GUIDE  05/19/2020   LAPAROSCOPIC INGUINAL HERNIA REPAIR Bilateral 02/09/2010   MASS EXCISION Left 03/12/2013   Procedure: LEFT INDEX EXCISION MASS AND DEBRIDMENT DISTAL INTERPHALANGEAL JOINT;  Surgeon: Tennis Must, MD;  Location: [facility];  Service: Orthopedics;  Laterality: Left;   PERIPHERAL VASCULAR BALLOON ANGIOPLASTY Left 08/30/2020   Procedure: PERIPHERAL VASCULAR BALLOON  ANGIOPLASTY;  Surgeon: Serafina Mitchell, MD;  Location: [facility];  Service: Cardiovascular;  Laterality: Left;  Peroneal   FAMILY HISTORY Family History  Problem Relation Age of Onset   Lymphoma Father    Cancer Paternal Grandmother        Colon Cancer   Colon cancer Paternal Grandmother    Colon polyps Neg Hx    Esophageal cancer Neg Hx    Rectal cancer Neg Hx    Stomach cancer Neg Hx    Diabetes Mellitus I Neg Hx    Pancreatic cancer Neg Hx    SOCIAL HISTORY Social History   Tobacco Use   Smoking status: Former    Types: Cigars    Quit date: 2022    Years since quitting: 2.1   Smokeless tobacco: Never  Vaping Use   Vaping Use: Never  used  Substance Use Topics   Alcohol use: Yes    Comment: about once a year   Drug use: No       OPHTHALMIC EXAM: Base Eye Exam     Visual Acuity (Snellen - Linear)       Right Left   Dist [place] 20/100 -2 CF at 3'   Dist ph [place] 20/50 -1 NI         Tonometry (Tonopen, 9:15 AM)       Right Left   Pressure 11 11         Pupils       Dark Light Shape React APD   Right 4 3 Round Sluggish None   Left 4 3 Round Sluggish None         Visual Fields (Counting fingers)       Left Right     Full   Restrictions Partial outer superior nasal, inferior nasal deficiencies          Extraocular Movement       Right Left    Full, Ortho Full, Ortho         Neuro/Psych     Oriented x3: Yes   Mood/Affect: Normal         Dilation     Both eyes: 1.0% Mydriacyl, 2.5% Phenylephrine @ 9:15 AM           Slit Lamp and Fundus Exam     External Exam       Right Left   External Normal Normal         Slit Lamp Exam       Right Left   Lids/Lashes Dermatochalasis - upper lid, Meibomian gland dysfunction Dermatochalasis - upper lid, Meibomian gland dysfunction   Conjunctiva/Sclera White and quiet White and quiet   Cornea arcus, 2-3+ Punctate epithelial erosions arcus, 1-2+ Punctate epithelial erosions,  tear film debris   Anterior Chamber Deep and quiet Deep and quiet   Iris Round and dilated, No NVI Round and dilated, No NVI   Lens 2-3+ Nuclear sclerosis with brunescence, 2+ Cortical cataract 2-3+ Nuclear sclerosis with brunescence, 2-3+ Cortical cataract   Anterior Vitreous Vitreous syneresis, blood stained vitreous condensations -- improved / resolved Vitreous syneresis, diffuse VH - improved, interval improvement in white blood stained vitreous condensations, pre-retinal heme white and settled inferiorly         Fundus Exam       Right Left   Disc Pink and Sharp, NVD - regressed Compact, fibrosis, mild Pallor   C/D Ratio 0.2 not visible   Macula Flat, good foveal reflex, scatterd DBH greatest temporally, cystic changes/edema temporal fovea and macula -- increased +traction, Blunted foveal reflex, grossly flat, scattered MA / DBH, focal exudates temporal macula   Vessels +NVE just inside ST arcades - improved, +fibrosis, attenuated, Tortuous Severe attenuation, Tortuous, +NVD; NVE along ST arcades - regressing, +fibrosis   Periphery Attached; scattered MA/DBH, good 360 PRP with room for fill in [place] view, scattered fibrosis, grossly attached, scattered PRP visible, scattered MA/DBH           Refraction     Wearing Rx       Sphere Cylinder Axis Add   Right +0.25 +2.00 180 +2.50   Left +0.50 +1.75 009 +2.50           IMAGING AND PROCEDURES  Imaging and Procedures for 02/23/2022  OCT, Retina - OU - Both Eyes       Right Eye  Quality was good. Central Foveal Thickness: 446. Progression has worsened. Findings include no SRF, abnormal foveal contour, intraretinal hyper-reflective material, intraretinal fluid, vitreous traction, preretinal fibrosis (Partial PVD, interval increase in IRF, persistent PRF with traction, mild interval improvement in vitreous opacities).   Left Eye Quality was poor. Central Foveal Thickness: 233. Progression has improved. Findings include no SRF,  abnormal foveal contour, intraretinal hyper-reflective material, intraretinal fluid, vitreous traction (interval improvement in vitreous opacities, persistent vitreous traction greatest along proximal arcades and disc).   Notes *Images captured and stored on drive  Diagnosis / Impression:  PDR with DME OU [id number] PVD, interval increase in IRF, persistent PRF with traction, mild interval improvement in vitreous opacities OS: interval improvement in vitreous opacities, persistent vitreous traction greatest along proximal arcades and disc  Clinical management:  See below  Abbreviations: NFP - Normal foveal profile. CME - cystoid macular edema. PED - pigment epithelial detachment. IRF - intraretinal fluid. SRF - subretinal fluid. EZ - ellipsoid zone. ERM - epiretinal membrane. ORA - outer retinal atrophy. ORT - outer retinal tubulation. SRHM - subretinal hyper-reflective material. IRHM - intraretinal hyper-reflective material      Intravitreal Injection, Pharmacologic Agent - OD - Right Eye       Time Out 02/23/2022. 10:07 AM. Confirmed correct patient, procedure, site, and patient consented.   Anesthesia Topical anesthesia was used. Anesthetic medications included Lidocaine 2%, Proparacaine 0.5%.   Procedure Preparation included 5% betadine to ocular surface, eyelid speculum. A supplied (32g) needle was used.   Injection: 1.25 mg Bevacizumab 1.'25mg'$ /0.70m   Route: Intravitreal, Site: Right Eye   NDC: [id number] Expiration date: 04/15/2022   Post-op Post injection exam found visual acuity of at least counting fingers. The patient tolerated the procedure well. There were no complications. The patient received written and verbal post procedure care education. Post injection medications were not given.      Intravitreal Injection, Pharmacologic Agent - OS - Left Eye       Time Out 02/23/2022. 10:07 AM. Confirmed correct patient, procedure, site, and patient  consented.   Anesthesia Topical anesthesia was used. Anesthetic medications included Lidocaine 2%, Proparacaine 0.5%.   Procedure Preparation included 5% betadine to ocular surface, eyelid speculum. A supplied (32 g) needle was used.   Injection: 1.25 mg Bevacizumab 1.'25mg'$ /0.049m  Route: Intravitreal, Site: Left Eye   NDC: [id number]: [id number]@7'$ , Expiration date: 03/16/2022   Post-op Post injection exam found visual acuity of at least counting fingers. The patient tolerated the procedure well. There were no complications. The patient received written and verbal post procedure care education. Post injection medications were not given.            ASSESSMENT/PLAN:    ICD-10-CM   1. Proliferative diabetic retinopathy of both eyes with macular edema associated with type 2 diabetes mellitus (HCC)  E11.3513 OCT, Retina - OU - Both Eyes    Intravitreal Injection, Pharmacologic Agent - OD - Right Eye    Intravitreal Injection, Pharmacologic Agent - OS - Left Eye    Bevacizumab (AVASTIN) SOLN 1.25 mg    Bevacizumab (AVASTIN) SOLN 1.25 mg    2. Vitreous hemorrhage of left eye (HCC)  H43.12     3. Essential hypertension  I10     4. Hypertensive retinopathy of both eyes  H35.033     5. Combined forms of age-related cataract of both eyes  H25.813      1,2. Severe proliferative diabetic retinopathy, both eyes  - pt has been  lost to follow up since 11.10.23 -- cancelled sx x3  - last A1c 9.7 on 08.18.23; 9.2 on 4.20.23; A1c >15% on 10.11.22 - s/p PRP OS (11.10.22)  - s/p PRP OD (12.02.22)  - s/p IVA OS #1 (01.18.23) for VH, #2 (03.17.23), #3 (04.14.23), #4 (05.12.23), #5 (06.09.23), #6 (07.07.23), #7 (08.10.23), #8 (09.08.23), #9 (10.06.23) #10 (11.10.23)  - s/p IVA OD #1 (01.27.23) for macular edema/NVD, #2 (03.17.23), #3 (04.14.23), #4 (05.12.23), #5 (06.09.23), #6 (07.07.23), #7 (08.10.23), #8 (09.08.23), #9 (10.06.23), #10 (11.10.23) - pt reports improved BG control -- on  insulin pump - exam shows extensive neovascularization, fibrosis and hemorrhage OU (OS >>> OD)  - improved VH OU - BCVA 20/60 OD; 20/300 OS -- worse OU - FA (11.09.22) shows areas of vascular non-perfusion OU, +NVD OU and +NVE OU - OCT shows OD: Partial PVD, interval increase in IRF, persistent PRF with traction, mild interval improvement in vitreous opacities; OS: interval improvement in vitreous opacities, persistent vitreous traction greatest along proximal arcades and disc - discussed findings, severity of his disease, prognosis and likely need for extensive treatments -- PRP OU, multiple rounds of anti-VEGF therapy OU and possible surgery - recommend IVA OU #11 today, 02.23.24 for PDR and DME OU - pt wishes to proceed with injections - RBA of procedure discussed, questions answered - informed consent obtained and signed, 01.18.23 - see procedure note - f/u 4 weeks, DFE/OCT  3,4. Hypertensive retinopathy OU - discussed importance of tight BP control - monitor   5. Mixed Cataract OU - The symptoms of cataract, surgical options, and treatments and risks were discussed with patient - discussed diagnosis and progression - monitor   Ophthalmic Meds Ordered this visit:  Meds ordered this encounter  Medications   Bevacizumab (AVASTIN) SOLN 1.25 mg   Bevacizumab (AVASTIN) SOLN 1.25 mg     Return in about 4 weeks (around 03/23/2022) for f/u PDR OU, DFE, OCT.  There are no Patient Instructions on file for this visit.  This document serves as a record of services personally performed by [name], MD, PhD. It was created on their behalf by Joetta Manners COT, an ophthalmic technician. The creation of this record is the provider's dictation and/or activities during the visit.    Electronically signed by: Joetta Manners COT 02.22.24 10:36 AM  This document serves as a record of services personally performed by [name], MD, PhD. It was created on their behalf by  [place] Jet[place]. Owens Shark, OA an ophthalmic technician. The creation of this record is the provider's dictation and/or activities during the visit.    Electronically signed by: San Jetty. Marguerita Merles 02.23.2024 10:36 AM  [name], M.D., Ph.D. Diseases & Surgery of the Retina and [place] 02/23/2022   I have reviewed the above documentation for accuracy and completeness, and I agree with the above. [name], M.D., Ph.D. 02/24/22 10:41 AM   Abbreviations: M myopia (nearsighted); A astigmatism; H hyperopia (farsighted); P presbyopia; Mrx spectacle prescription;  CTL contact lenses; OD right eye; OS left eye; OU both eyes  XT exotropia; ET esotropia; PEK punctate epithelial keratitis; PEE punctate epithelial erosions; DES dry eye syndrome; MGD meibomian gland dysfunction; ATs artificial tears; PFAT's preservative free artificial tears; [place] nuclear sclerotic cataract; PSC posterior subcapsular cataract; ERM epi-retinal membrane; PVD posterior vitreous detachment; RD retinal detachment; DM diabetes mellitus; DR diabetic retinopathy; NPDR non-proliferative diabetic retinopathy; PDR proliferative diabetic retinopathy; CSME clinically significant macular edema; DME  diabetic macular edema; dbh dot blot hemorrhages; CWS cotton wool spot; POAG primary open angle glaucoma; C/D cup-to-disc ratio; HVF humphrey visual field; GVF goldmann visual field; OCT optical coherence tomography; IOP intraocular pressure; BRVO Branch retinal vein occlusion; CRVO central retinal vein occlusion; CRAO central retinal artery occlusion; BRAO branch retinal artery occlusion; RT retinal tear; SB scleral buckle; PPV pars plana vitrectomy; VH Vitreous hemorrhage; PRP panretinal laser photocoagulation; IVK intravitreal kenalog; VMT vitreomacular traction; MH Macular hole;  NVD neovascularization of the disc; NVE neovascularization elsewhere; AREDS age related eye disease study; ARMD age related macular  degeneration; POAG primary open angle glaucoma; EBMD epithelial/anterior basement membrane dystrophy; ACIOL anterior chamber intraocular lens; IOL intraocular lens; PCIOL posterior chamber intraocular lens; Phaco/IOL phacoemulsification with intraocular lens placement; [place] photorefractive keratectomy; LASIK laser assisted in situ keratomileusis; HTN hypertension; DM diabetes mellitus; COPD chronic obstructive pulmonary disease

## 2022-02-23 ENCOUNTER — Ambulatory Visit (INDEPENDENT_AMBULATORY_CARE_PROVIDER_SITE_OTHER): Payer: HMO | Admitting: Ophthalmology

## 2022-02-23 ENCOUNTER — Encounter (INDEPENDENT_AMBULATORY_CARE_PROVIDER_SITE_OTHER): Payer: Self-pay | Admitting: Ophthalmology

## 2022-02-23 DIAGNOSIS — H4312 Vitreous hemorrhage, left eye: Secondary | ICD-10-CM

## 2022-02-23 DIAGNOSIS — E113513 Type 2 diabetes mellitus with proliferative diabetic retinopathy with macular edema, bilateral: Secondary | ICD-10-CM

## 2022-02-23 DIAGNOSIS — H25813 Combined forms of age-related cataract, bilateral: Secondary | ICD-10-CM | POA: Diagnosis not present

## 2022-02-23 DIAGNOSIS — I10 Essential (primary) hypertension: Secondary | ICD-10-CM

## 2022-02-23 DIAGNOSIS — H35033 Hypertensive retinopathy, bilateral: Secondary | ICD-10-CM | POA: Diagnosis not present

## 2022-02-23 MED ORDER — BEVACIZUMAB CHEMO INJECTION 1.25MG/0.05ML SYRINGE FOR KALEIDOSCOPE
1.2500 mg | INTRAVITREAL | Status: AC | PRN
  Administered 2022-02-23: 1.25 mg via INTRAVITREAL

## 2022-02-24 ENCOUNTER — Encounter (INDEPENDENT_AMBULATORY_CARE_PROVIDER_SITE_OTHER): Payer: Self-pay | Admitting: Ophthalmology

## 2022-02-26 ENCOUNTER — Ambulatory Visit: Payer: HMO | Admitting: Podiatry

## 2022-02-26 NOTE — Progress Notes (Unsigned)
Cardiology Office Note:   Date:  02/27/2022  NAME:  [name] [name]    MRN: [id number] DOB:  [birth year]   PCP:  Fredirick Lathe, PA-C  Cardiologist:  Evalina Field, MD  Electrophysiologist:  None   Referring MD: Fredirick Lathe, PA-C   Chief Complaint  Patient presents with   Follow-up   History of Present Illness:   [name] [name] is a 67 y.o. male with a hx of persistent A-fib, CAD status post CABG, diabetes, hypertension who presents for follow-up.  Admitted to the hospital in early January for UTI.  Was noted to be in A-fib with slow ventricular response.  Monitor shows average heart rate 48 bpm.  No significant pauses.  He reports he is recovering from his hospitalization.  He did work with physical therapy but they have stopped coming to his house.  He reports his balance is an issue.  He is trying to get more active.  He denies any chest pain.  He can get winded with long periods of activity.  I encouraged him to remain active.  EKG shows A-fib heart rate 59.  His A-fib does not bother him.  No significant bruising or bleeding.  Echocardiogram showed normal LV function.  He is working on his diabetes.  LDL cholesterol is at goal.  CV examination unremarkable.  He does have some lower extremity edema.  This is controlled with Lasix.  He does have a history of left peroneal artery stenosis as well as right PT stenosis.   Problem List PAD -R PT stenosis -> balloon 11/2020 -L peroneal artery -> balloon/recurrent stenosis 11/2020 2. CAD s/p CABG -09/30/2001 CABG x 4 3. Persistent Afib  4. DM -A1c 7.8 5. HTN 6. HFpEF 7. HLD -T chol 78, HDL 39, LDL 34, TG 26  Past Medical History: Past Medical History:  Diagnosis Date   Anxiety    Arthritis    Cataract    CHF (congestive heart failure) (HCC)    Constipation    pt states goes EOD- hard stools    Coronary artery disease    Depression    Diabetes mellitus without complication (HCC)    Diabetic retinopathy  ([place])    DKA (diabetic ketoacidoses)    Hyperlipidemia    Hypertension    off meds   Hypertensive retinopathy    Postherpetic neuralgia 08/11/2021   Speculative diagnosis   Psoriasis    Tubular adenoma of colon 03/2019    Past Surgical History: Past Surgical History:  Procedure Laterality Date   ABDOMINAL AORTOGRAM W/LOWER EXTREMITY N/A 08/30/2020   Procedure: ABDOMINAL AORTOGRAM W/LOWER EXTREMITY;  Surgeon: Serafina Mitchell, MD;  Location: [place] [facility];  Service: Cardiovascular;  Laterality: N/A;   ABDOMINAL AORTOGRAM W/LOWER EXTREMITY N/A 11/15/2020   Procedure: ABDOMINAL AORTOGRAM W/LOWER EXTREMITY;  Surgeon: Serafina Mitchell, MD;  Location: [facility] [facility];  Service: Cardiovascular;  Laterality: N/A;   ANKLE SURGERY     right   CARDIAC CATHETERIZATION  09/25/2001   COLONOSCOPY     ~10 yr ago- normal  per pt    CORONARY ARTERY BYPASS GRAFT  09/30/2001   CABG x 4   IR THORACENTESIS ASP PLEURAL SPACE W/IMG GUIDE  05/19/2020   LAPAROSCOPIC INGUINAL HERNIA REPAIR Bilateral 02/09/2010   MASS EXCISION Left 03/12/2013   Procedure: LEFT INDEX EXCISION MASS AND DEBRIDMENT DISTAL INTERPHALANGEAL JOINT;  Surgeon: Tennis Must, MD;  Location: [place];  Service: Orthopedics;  Laterality: Left;  PERIPHERAL VASCULAR BALLOON ANGIOPLASTY Left 08/30/2020   Procedure: PERIPHERAL VASCULAR BALLOON ANGIOPLASTY;  Surgeon: Serafina Mitchell, MD;  Location: [facility];  Service: Cardiovascular;  Laterality: Left;  Peroneal    Current Medications: Current Meds  Medication Sig   acetaminophen (TYLENOL) 500 MG tablet Take 500 mg by mouth as needed for mild pain or moderate pain.   apixaban (ELIQUIS) 5 MG TABS tablet Take 1 tablet (5 mg total) by mouth 2 (two) times daily. PLEASE SCHEDULE APPT WITH [facility] FOR FURTHER REFILLS [phone] (Patient taking differently: Take 5 mg by mouth 2 (two) times daily.)   aspirin EC 81 MG EC tablet Take 1 tablet (81 mg total) by mouth  daily. (Patient taking differently: Take 81 mg by mouth in the morning.)   atorvastatin (LIPITOR) 40 MG tablet TAKE 1 TABLET BY MOUTH EVERY DAY (Patient taking differently: Take 40 mg by mouth in the morning.)   BD PEN NEEDLE NANO 2ND GEN 32G X 4 MM MISC USE FOR INSULIN   Continuous Blood Gluc Sensor (DEXCOM G6 SENSOR) MISC 1 Device by Does not apply route See admin instructions. Change every 10 days   Continuous Blood Gluc Transmit (DEXCOM G6 TRANSMITTER) MISC Use as instructed to check blood sugar   diclofenac Sodium (VOLTAREN) 1 % GEL Apply 1 Application topically as needed (pain).   furosemide (LASIX) 40 MG tablet TAKE 1 TABLET BY MOUTH EVERY DAY   Incontinence Supply Disposable (DISPOSABLE BRIEF LARGE) MISC 1 each by Does not apply route as needed.   insulin aspart (NOVOLOG FLEXPEN) 100 UNIT/ML FlexPen Inject 4 Units into the skin 2 (two) times daily. When pump is not attached.   Insulin Disposable Pump (OMNIPOD 5 G6 INTRO, GEN 5,) KIT 1 Device by Does not apply route every 3 (three) days.   Insulin Disposable Pump (OMNIPOD 5 G6 POD, GEN 5,) MISC APPLY EVERY 3 (THREE) DAYS.   lidocaine (XYLOCAINE) 5 % ointment Apply 1 Application topically as needed.   losartan (COZAAR) 50 MG tablet Take 1 tablet (50 mg total) by mouth daily.   mirtazapine (REMERON) 15 MG tablet TAKE 1 TABLET BY MOUTH EVERYDAY AT BEDTIME   Multiple Vitamins-Minerals (MULTIVITAMIN WITH MINERALS) tablet Take 1 tablet by mouth daily.   NOVOLOG 100 UNIT/ML injection INJECT 50 UNITS INTO THE SKIN SEE ADMIN INSTRUCTIONS. PT LOADS PUMP WITH 50 UNITS EVERY 3 DAYS (Patient taking differently: Inject 110 Units into the skin See admin instructions. Inject 110 units into pod every three days)   PARoxetine (PAXIL) 20 MG tablet TAKE 1 TABLET BY MOUTH EVERY DAY (Patient taking differently: Take 20 mg by mouth in the morning.)   spironolactone (ALDACTONE) 25 MG tablet TAKE 1 TABLET BY MOUTH EVERY DAY (Patient taking differently: Take 25 mg by  mouth in the morning.)     Allergies:    Wool alcohol [lanolin]   Social History: Social History   Socioeconomic History   Marital status: Divorced    Spouse name: Not on file   Number of children: 4   Years of education: 12   Highest education level: 12th grade  Occupational History   Occupation: Retired  Tobacco Use   Smoking status: Former    Types: Cigars    Quit date: 2022    Years since quitting: 2.1   Smokeless tobacco: Never  Vaping Use   Vaping Use: Never used  Substance and Sexual Activity   Alcohol use: Yes    Comment: about once a year   Drug  use: No   Sexual activity: Yes  Other Topics Concern   Not on file  Social History Narrative   Lives with his adult son [name].   Has 4 children, 2 sons- both live in [place], 2 daughters one lives in [place], [place] and youngest daughter attends [employer].    Fiance lives in  [place], [place]   Social Determinants of Health   Financial Resource Strain: Unknown (10/23/2019)   Overall Financial Resource Strain (CARDIA)    Difficulty of Paying Living Expenses: Patient refused  Food Insecurity: No Food Insecurity (01/03/2022)   Hunger Vital Sign    Worried About Running Out of Food in the Last Year: Never true    Ran Out of Food in the Last Year: Never true  Transportation Needs: No Transportation Needs (01/03/2022)   PRAPARE - Hydrologist (Medical): No    Lack of Transportation (Non-Medical): No  Physical Activity: Inactive (10/23/2019)   Exercise Vital Sign    Days of Exercise per Week: 0 days    Minutes of Exercise per Session: 0 min  Stress: Stress Concern Present (10/23/2019)   [place]    Feeling of Stress : Very much  Social Connections: Socially Isolated (10/23/2019)   Social Connection and Isolation Panel [NHANES]    Frequency of Communication with Friends and Family: More than three times a  week    Frequency of Social Gatherings with Friends and Family: More than three times a week    Attends Religious Services: Never    Marine scientist or Organizations: No    Attends Music therapist: Never    Marital Status: Divorced     Family History: The patient's family history includes Cancer in his paternal grandmother; Colon cancer in his paternal grandmother; Lymphoma in his father. There is no history of Colon polyps, Esophageal cancer, Rectal cancer, Stomach cancer, Diabetes Mellitus I, or Pancreatic cancer.  ROS:   All other ROS reviewed and negative. Pertinent positives noted in the HPI.     EKGs/Labs/Other Studies Reviewed:   The following studies were personally reviewed by me today:  EKG:  EKG is ordered today.  The ekg ordered today demonstrates A-fib heart rate 59 no acute ischemic changes or evidence of infarction, and was personally reviewed by me.   [place] 01/04/2022 Impression: Atrial fibrillation present with 100% burden. Average heart rate 48 bpm.  Longest pause 3.4 seconds.  Rare ectopy.   TTE 01/04/2022  1. Left ventricular ejection fraction, by estimation, is 60 to 65%. The  left ventricle has normal function. The left ventricle has no regional  wall motion abnormalities. There is moderate asymmetric left ventricular  hypertrophy of the basal-septal  segment. Left ventricular diastolic parameters are indeterminate.   2. Right ventricular systolic function is normal. The right ventricular  size is normal. Tricuspid regurgitation signal is inadequate for assessing  PA pressure.   3. The mitral valve is normal in structure. Trivial mitral valve  regurgitation. No evidence of mitral stenosis.   4. The aortic valve is tricuspid. Aortic valve regurgitation is not  visualized. Aortic valve sclerosis/calcification is present, without any  evidence of aortic stenosis.   5. Aortic dilatation noted. There is mild dilatation of the ascending  aorta,  measuring 38 mm.   6. The inferior vena cava is dilated in size with >50% respiratory  variability, suggesting right atrial pressure of 8 mmHg.   Recent  Labs: 07/17/2021: B Natriuretic Peptide 146.2 01/03/2022: TSH 4.051 01/04/2022: ALT 9; Magnesium 1.8 01/06/2022: BUN 17; Creatinine, Ser 0.90; Hemoglobin 12.0; Platelets 175; Potassium 3.5; Sodium 133   Recent Lipid Panel    Component Value Date/Time   CHOL 78 02/16/2021 0434   TRIG 26 02/16/2021 0434   HDL 39 (L) 02/16/2021 0434   CHOLHDL 2.0 02/16/2021 0434   VLDL 5 02/16/2021 0434   LDLCALC 34 02/16/2021 0434   LDLDIRECT 37.0 03/16/2020 0958    Physical Exam:   VS:  BP (!) 142/84 (BP Location: Right Arm, Patient Position: Sitting, Cuff Size: Normal)   Pulse (!) 59   Ht '6\' 2"'$  (1.88 m)   Wt 169 lb (76.7 kg)   BMI 21.70 kg/m    Wt Readings from Last 3 Encounters:  02/27/22 169 lb (76.7 kg)  01/06/22 167 lb 12.3 oz (76.1 kg)  11/17/21 165 lb (74.8 kg)    General: Well nourished, well developed, in no acute distress Head: Atraumatic, normal size  Eyes: PEERLA, EOMI  Neck: Supple, no JVD Endocrine: No thryomegaly Cardiac: Normal S1, S2; RRR; irregular rhythm  Lungs: Clear to auscultation bilaterally, no wheezing, rhonchi or rales  Abd: Soft, nontender, no hepatomegaly  Ext: 1+ pitting edema  Musculoskeletal: No deformities, BUE and BLE strength normal and equal Skin: Warm and dry, no rashes   Neuro: Alert and oriented to person, place, time, and situation, CNII-XII grossly intact, no focal deficits  Psych: Normal mood and affect   ASSESSMENT:   [name] [name] is a 67 y.o. male who presents for the following: 1. Persistent atrial fibrillation ([place])   2. Chronic diastolic heart failure ([place])   3. Coronary artery disease involving coronary bypass graft of native heart without angina pectoris   4. Mixed hyperlipidemia   5. PAD (peripheral artery disease) ([place])     PLAN:   1. Persistent atrial fibrillation  (HCC) -Persistent A-fib.  Average heart rate 48 bpm.  No symptoms from this.  On Eliquis.  For now he is recovering from UTI.  He is without symptoms from his A-fib.  Would recommend just to monitor this for now.  His EKG shows heart rate is 59 today.  No significant pauses.  No dizziness or lightheadedness.  Will just continue with conservative approach.  2. Chronic diastolic heart failure (HCC) -Controlled with Lasix 40 mg daily.  3. Coronary artery disease involving coronary bypass graft of native heart without angina pectoris 4. Mixed hyperlipidemia -CAD status post CABG.  No symptoms of angina.  LV function is normal.  Continue statin therapy.  LDL at goal.  On aspirin.  5. PAD (peripheral artery disease) (HCC) -No ulcers.  No significant pain in his legs.  History of peripheral intervention.  We will follow-up with lower extremity arterial duplexes.      Disposition: Return in about 6 months (around 08/28/2022).  Medication Adjustments/Labs and Tests Ordered: Current medicines are reviewed at length with the patient today.  Concerns regarding medicines are outlined above.  Orders Placed This Encounter  Procedures   EKG 12-Lead   VAS [place] LOWER EXTREMITY ARTERIAL DUPLEX   No orders of the defined types were placed in this encounter.   Patient Instructions  Medication Instructions:  The current medical regimen is effective;  continue present plan and medications.  *If you need a refill on your cardiac medications before your next appointment, please call your pharmacy*  Testing/Procedures: Your physician has requested that you have a lower extremity arterial duplex.  This test is an ultrasound of the arteries in the legs. It looks at arterial blood flow in the legs. Allow one hour for Lower Arterial scans. There are no restrictions or special instructions    Follow-Up: At [facility], you and your health needs are our priority.  As part of our continuing mission to  provide you with exceptional heart care, we have created designated Provider Care Teams.  These Care Teams include your primary Cardiologist (physician) and Advanced Practice Providers (APPs -  Physician Assistants and Nurse Practitioners) who all work together to provide you with the care you need, when you need it.  We recommend signing up for the patient portal called "MyChart".  Sign up information is provided on this After Visit Summary.  MyChart is used to connect with patients for Virtual Visits (Telemedicine).  Patients are able to view lab/test results, encounter notes, upcoming appointments, etc.  Non-urgent messages can be sent to your provider as well.   To learn more about what you can do with MyChart, go to [email or web address].    Your next appointment:   6 month(s)  Provider:   Evalina Field, MD        Time Spent with Patient: I have spent a total of 25 minutes with patient reviewing hospital notes, telemetry, EKGs, labs and examining the patient as well as establishing an assessment and plan that was discussed with the patient.  > 50% of time was spent in direct patient care.  Signed, Addison Naegeli. Audie Box, MD, [place]  [address], [place], [place] [address] [phone]  02/27/2022 2:56 PM

## 2022-02-27 ENCOUNTER — Ambulatory Visit: Payer: PPO | Attending: General Practice | Admitting: Cardiovascular Disease

## 2022-02-27 ENCOUNTER — Encounter: Payer: Self-pay | Admitting: Cardiovascular Disease

## 2022-02-27 VITALS — BP 142/84 | HR 59 | Ht 74.0 in | Wt 169.0 lb

## 2022-02-27 DIAGNOSIS — E782 Mixed hyperlipidemia: Secondary | ICD-10-CM

## 2022-02-27 DIAGNOSIS — I739 Peripheral vascular disease, unspecified: Secondary | ICD-10-CM

## 2022-02-27 DIAGNOSIS — I5032 Chronic diastolic (congestive) heart failure: Secondary | ICD-10-CM

## 2022-02-27 DIAGNOSIS — I25810 Atherosclerosis of coronary artery bypass graft(s) without angina pectoris: Secondary | ICD-10-CM | POA: Diagnosis not present

## 2022-02-27 DIAGNOSIS — I4819 Other persistent atrial fibrillation: Secondary | ICD-10-CM | POA: Diagnosis not present

## 2022-02-27 NOTE — Patient Instructions (Addendum)
Medication Instructions:  The current medical regimen is effective;  continue present plan and medications.  *If you need a refill on your cardiac medications before your next appointment, please call your pharmacy*  Testing/Procedures: Your physician has requested that you have a lower extremity arterial duplex. This test is an ultrasound of the arteries in the legs. It looks at arterial blood flow in the legs. Allow one hour for Lower Arterial scans. There are no restrictions or special instructions    Follow-Up: At [facility], you and your health needs are our priority.  As part of our continuing mission to provide you with exceptional heart care, we have created designated Provider Care Teams.  These Care Teams include your primary Cardiologist (physician) and Advanced Practice Providers (APPs -  Physician Assistants and Nurse Practitioners) who all work together to provide you with the care you need, when you need it.  We recommend signing up for the patient portal called "MyChart".  Sign up information is provided on this After Visit Summary.  MyChart is used to connect with patients for Virtual Visits (Telemedicine).  Patients are able to view lab/test results, encounter notes, upcoming appointments, etc.  Non-urgent messages can be sent to your provider as well.   To learn more about what you can do with MyChart, go to [email or web address].    Your next appointment:   6 month(s)  Provider:   Evalina Field, MD

## 2022-02-28 ENCOUNTER — Telehealth: Payer: Self-pay | Admitting: Cardiovascular Disease

## 2022-02-28 DIAGNOSIS — I480 Paroxysmal atrial fibrillation: Secondary | ICD-10-CM

## 2022-02-28 NOTE — Telephone Encounter (Signed)
*  STAT* If patient is at the pharmacy, call can be transferred to refill team.   1. Which medications need to be refilled? (please list name of each medication and dose if known)   apixaban (ELIQUIS) 5 MG TABS tablet    2. Which pharmacy/location (including street and city if local pharmacy) is medication to be sent to? [id number] [place] [facility] [address]   3. Do they need a 30 day or 90 day supply? 90 day   Patient's mother requesting Dr. OAudie Boxtake over his refills for eliquis.

## 2022-03-01 NOTE — Telephone Encounter (Signed)
Pt's mother calling to f/u on refill request. Pt is completely out of medication. Please advise.

## 2022-03-02 MED ORDER — APIXABAN 5 MG PO TABS: 5.0000 mg | ORAL_TABLET | Freq: Two times a day (BID) | ORAL | 1 refills | Status: DC

## 2022-03-02 NOTE — Telephone Encounter (Signed)
Patient's mother is following up. She states the patient is completely out of medication.

## 2022-03-02 NOTE — Telephone Encounter (Signed)
Rx sent to pharmacy   

## 2022-03-05 ENCOUNTER — Ambulatory Visit (INDEPENDENT_AMBULATORY_CARE_PROVIDER_SITE_OTHER): Payer: HMO | Admitting: Podiatry

## 2022-03-05 ENCOUNTER — Encounter: Payer: Self-pay | Admitting: Podiatry

## 2022-03-05 DIAGNOSIS — M79675 Pain in left toe(s): Secondary | ICD-10-CM

## 2022-03-05 DIAGNOSIS — I739 Peripheral vascular disease, unspecified: Secondary | ICD-10-CM | POA: Diagnosis not present

## 2022-03-05 DIAGNOSIS — L84 Corns and callosities: Secondary | ICD-10-CM

## 2022-03-05 DIAGNOSIS — M79674 Pain in right toe(s): Secondary | ICD-10-CM

## 2022-03-05 DIAGNOSIS — B351 Tinea unguium: Secondary | ICD-10-CM

## 2022-03-05 DIAGNOSIS — E1165 Type 2 diabetes mellitus with hyperglycemia: Secondary | ICD-10-CM | POA: Diagnosis not present

## 2022-03-05 NOTE — Progress Notes (Signed)
This patient returns to my office for at risk foot care.  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  This patient is unable to cut nails himself since the patient cannot reach his nails.These nails are painful walking and wearing shoes.  He presents to the office with his wife..This patient presents for at risk foot care today.  General Appearance  Alert, conversant and in no acute stress.  Vascular  Dorsalis pedis and posterior tibial  pulses are palpable  bilaterally.  Capillary return is within normal limits  bilaterally. Temperature is within normal limits  bilaterally.  Neurologic  Senn-Weinstein monofilament wire test within normal limits  bilaterally. Muscle power within normal limits bilaterally.  Nails Thick disfigured discolored nails with subungual debris  from hallux to fifth toes bilaterally. No evidence of bacterial infection or drainage bilaterally.  Orthopedic  No limitations of motion  feet .  No crepitus or effusions noted.  No bony pathology or digital deformities noted.  Skin  normotropic skin with no porokeratosis noted bilaterally.  No signs of infections or ulcers noted.   Pre-ulcerous callus sub 5th right foot.  Onychomycosis  Pain in right toes  Pain in left toes  Callus   Consent was obtained for treatment procedures.   Mechanical debridement of nails 1-5  bilaterally performed with a nail nipper.  Filed with dremel without incident. Debride callus with dremel tool.   Return office visit   3  months                   Told patient to return for periodic foot care and evaluation due to potential at risk complications.   Gardiner Barefoot DPM

## 2022-03-12 ENCOUNTER — Other Ambulatory Visit: Payer: Self-pay | Admitting: Physician Assistant

## 2022-03-12 ENCOUNTER — Other Ambulatory Visit: Payer: Self-pay | Admitting: Cardiovascular Disease

## 2022-03-12 DIAGNOSIS — I739 Peripheral vascular disease, unspecified: Secondary | ICD-10-CM

## 2022-03-14 ENCOUNTER — Ambulatory Visit (HOSPITAL_COMMUNITY): Admission: RE | Admit: 2022-03-14 | Payer: PPO | Source: Ambulatory Visit

## 2022-03-14 ENCOUNTER — Ambulatory Visit: Payer: PPO | Admitting: Physician Assistant

## 2022-03-21 ENCOUNTER — Telehealth: Payer: Self-pay | Admitting: Physician Assistant

## 2022-03-21 NOTE — Telephone Encounter (Signed)
Copied from [place] [id number]. Topic: Medicare AWV >> Mar 21, 2022  2:53 PM Gillis Santa wrote: Reason for CRM: Called patient to schedule Medicare Annual Wellness Visit (AWV). Left message for patient to call back and schedule Medicare Annual Wellness Visit (AWV).  Last date of AWV: N/A  Please schedule an appointment at any time with Otila Kluver, [facility].  Please schedule AWVI with Otila Kluver, [place].  If any questions, please contact me at [phone].  Thank you ,  Shaune Pollack [facility] AWV TEAM Direct Dial [phone]

## 2022-03-23 ENCOUNTER — Encounter (INDEPENDENT_AMBULATORY_CARE_PROVIDER_SITE_OTHER): Payer: HMO | Admitting: Ophthalmology

## 2022-03-23 ENCOUNTER — Other Ambulatory Visit: Payer: Self-pay | Admitting: Physician Assistant

## 2022-03-23 DIAGNOSIS — E113513 Type 2 diabetes mellitus with proliferative diabetic retinopathy with macular edema, bilateral: Secondary | ICD-10-CM

## 2022-03-23 DIAGNOSIS — I10 Essential (primary) hypertension: Secondary | ICD-10-CM

## 2022-03-23 DIAGNOSIS — H35033 Hypertensive retinopathy, bilateral: Secondary | ICD-10-CM

## 2022-03-23 DIAGNOSIS — H4312 Vitreous hemorrhage, left eye: Secondary | ICD-10-CM

## 2022-03-23 DIAGNOSIS — H25813 Combined forms of age-related cataract, bilateral: Secondary | ICD-10-CM

## 2022-03-23 DIAGNOSIS — E785 Hyperlipidemia, unspecified: Secondary | ICD-10-CM

## 2022-03-26 ENCOUNTER — Telehealth: Payer: Self-pay | Admitting: Nutrition

## 2022-03-26 DIAGNOSIS — E1042 Type 1 diabetes mellitus with diabetic polyneuropathy: Secondary | ICD-10-CM

## 2022-03-26 MED ORDER — DEXCOM G6 TRANSMITTER MISC: 3 refills | Status: DC

## 2022-03-26 NOTE — Telephone Encounter (Signed)
Transmitter sent to pharmacy.

## 2022-03-26 NOTE — Telephone Encounter (Signed)
Mother called saying patient needs Dexcom G6 transmitter.  [facility]

## 2022-03-27 ENCOUNTER — Telehealth: Payer: Self-pay | Admitting: Nutrition

## 2022-03-27 DIAGNOSIS — E1042 Type 1 diabetes mellitus with diabetic polyneuropathy: Secondary | ICD-10-CM

## 2022-03-27 MED ORDER — DEXCOM G6 TRANSMITTER MISC: 1 refills | Status: DC

## 2022-03-27 NOTE — Telephone Encounter (Signed)
Patient's mother is requesting that you please send the script for the [facility] G[facility] [address]

## 2022-03-27 NOTE — Telephone Encounter (Signed)
Please contact patient to schedule follow up appointment.

## 2022-03-30 ENCOUNTER — Ambulatory Visit (HOSPITAL_COMMUNITY): Payer: PPO

## 2022-04-02 ENCOUNTER — Other Ambulatory Visit: Payer: Self-pay | Admitting: Physician Assistant

## 2022-04-18 ENCOUNTER — Other Ambulatory Visit: Payer: Self-pay | Admitting: Physician Assistant

## 2022-04-18 DIAGNOSIS — F3341 Major depressive disorder, recurrent, in partial remission: Secondary | ICD-10-CM

## 2022-04-20 ENCOUNTER — Ambulatory Visit (HOSPITAL_COMMUNITY): Payer: PPO

## 2022-04-26 ENCOUNTER — Ambulatory Visit (HOSPITAL_COMMUNITY): Payer: PPO

## 2022-05-10 NOTE — Progress Notes (Addendum)
[facility] - Clinic Note  05/11/2022     CHIEF COMPLAINT Patient presents for Retina Follow Up  HISTORY OF PRESENT ILLNESS: [name] [name] is a 67 y.o. male who presents to the clinic today for:   HPI     Retina Follow Up   Patient presents with  Diabetic Retinopathy.  In both eyes.  This started months ago.  Duration of 4.  I, the attending physician,  performed the HPI with the patient and updated documentation appropriately.        Comments   Patient feels that the vision is worse than it was 4 weeks ago. He is not using eye drops. His blood sugar was 189.      Last edited by Rennis Chris, MD on 05/11/2022 11:50 AM.     Pt is delayed to follow up from 4 weeks to 11 weeks, pt states he has had a lot going on over the past few weeks, pt states his heart has been good  Referring physician: Mateo Flow, MD [address] [place],  [place] [address]  HISTORICAL INFORMATION:   Selected notes from the MEDICAL RECORD NUMBER Referred by Dr. Mateo Flow for PDR OU   CURRENT MEDICATIONS: No current outpatient medications on file. (Ophthalmic Drugs)   No current facility-administered medications for this visit. (Ophthalmic Drugs)   Current Outpatient Medications (Other)  Medication Sig   acetaminophen (TYLENOL) 500 MG tablet Take 500 mg by mouth as needed for mild pain or moderate pain.   apixaban (ELIQUIS) 5 MG TABS tablet Take 1 tablet (5 mg total) by mouth 2 (two) times daily.   aspirin EC 81 MG EC tablet Take 1 tablet (81 mg total) by mouth daily. (Patient taking differently: Take 81 mg by mouth in the morning.)   atorvastatin (LIPITOR) 40 MG tablet TAKE 1 TABLET BY MOUTH EVERY DAY   BD PEN NEEDLE NANO 2ND GEN 32G X 4 MM MISC USE FOR INSULIN   Continuous Blood Gluc Sensor (DEXCOM G6 SENSOR) MISC 1 Device by Does not apply route See admin instructions. Change every 10 days   Continuous Blood Gluc Transmit (DEXCOM G6 TRANSMITTER) MISC  Use as instructed to check blood sugar   diclofenac Sodium (VOLTAREN) 1 % GEL Apply 1 Application topically as needed (pain).   furosemide (LASIX) 40 MG tablet TAKE 1 TABLET BY MOUTH EVERY DAY   Incontinence Supply Disposable (DISPOSABLE BRIEF LARGE) MISC 1 each by Does not apply route as needed.   insulin aspart (NOVOLOG FLEXPEN) 100 UNIT/ML FlexPen Inject 4 Units into the skin 2 (two) times daily. When pump is not attached.   Insulin Disposable Pump (OMNIPOD 5 G6 INTRO, GEN 5,) KIT 1 Device by Does not apply route every 3 (three) days.   Insulin Disposable Pump (OMNIPOD 5 G6 POD, GEN 5,) MISC APPLY EVERY 3 (THREE) DAYS.   lidocaine (XYLOCAINE) 5 % ointment Apply 1 Application topically as needed.   mirtazapine (REMERON) 15 MG tablet TAKE 1 TABLET BY MOUTH EVERYDAY AT BEDTIME   Multiple Vitamins-Minerals (MULTIVITAMIN WITH MINERALS) tablet Take 1 tablet by mouth daily.   NOVOLOG 100 UNIT/ML injection INJECT 50 UNITS INTO THE SKIN SEE ADMIN INSTRUCTIONS. PT LOADS PUMP WITH 50 UNITS EVERY 3 DAYS (Patient taking differently: Inject 110 Units into the skin See admin instructions. Inject 110 units into pod every three days)   PARoxetine (PAXIL) 20 MG tablet TAKE 1 TABLET BY MOUTH EVERY DAY (Patient taking differently: Take 20 mg by  mouth in the morning.)   spironolactone (ALDACTONE) 25 MG tablet TAKE 1 TABLET BY MOUTH EVERY DAY   losartan (COZAAR) 50 MG tablet Take 1 tablet (50 mg total) by mouth daily.   No current facility-administered medications for this visit. (Other)   REVIEW OF SYSTEMS: ROS   Positive for: Neurological, Skin, Musculoskeletal, Endocrine, Cardiovascular, Eyes Negative for: Constitutional, Gastrointestinal, Genitourinary, HENT, Respiratory, Psychiatric, Allergic/Imm, Heme/Lymph Last edited by Julieanne Cotton, COT on 05/11/2022  7:30 AM.        ALLERGIES Allergies  Allergen Reactions   Wool Alcohol [Lanolin] Hives and Itching    Wool fabric   PAST MEDICAL  HISTORY Past Medical History:  Diagnosis Date   Anxiety    Arthritis    Cataract    CHF (congestive heart failure) (HCC)    Constipation    pt states goes EOD- hard stools    Coronary artery disease    Depression    Diabetes mellitus without complication (HCC)    Diabetic retinopathy (HCC)    DKA (diabetic ketoacidoses)    Hyperlipidemia    Hypertension    off meds   Hypertensive retinopathy    Postherpetic neuralgia 08/11/2021   Speculative diagnosis   Psoriasis    Tubular adenoma of colon 03/2019   Past Surgical History:  Procedure Laterality Date   ABDOMINAL AORTOGRAM W/LOWER EXTREMITY N/A 08/30/2020   Procedure: ABDOMINAL AORTOGRAM W/LOWER EXTREMITY;  Surgeon: Nada Libman, MD;  Location: [facility];  Service: Cardiovascular;  Laterality: N/A;   ABDOMINAL AORTOGRAM W/LOWER EXTREMITY N/A 11/15/2020   Procedure: ABDOMINAL AORTOGRAM W/LOWER EXTREMITY;  Surgeon: Nada Libman, MD;  Location: [facility];  Service: Cardiovascular;  Laterality: N/A;   ANKLE SURGERY     right   CARDIAC CATHETERIZATION  09/25/2001   COLONOSCOPY     ~10 yr ago- normal  per pt    CORONARY ARTERY BYPASS GRAFT  09/30/2001   CABG x 4   IR THORACENTESIS ASP PLEURAL SPACE W/IMG GUIDE  05/19/2020   LAPAROSCOPIC INGUINAL HERNIA REPAIR Bilateral 02/09/2010   MASS EXCISION Left 03/12/2013   Procedure: LEFT INDEX EXCISION MASS AND DEBRIDMENT DISTAL INTERPHALANGEAL JOINT;  Surgeon: Tami Ribas, MD;  Location: [facility];  Service: Orthopedics;  Laterality: Left;   PERIPHERAL VASCULAR BALLOON ANGIOPLASTY Left 08/30/2020   Procedure: PERIPHERAL VASCULAR BALLOON ANGIOPLASTY;  Surgeon: Nada Libman, MD;  Location: [facility];  Service: Cardiovascular;  Laterality: Left;  Peroneal   FAMILY HISTORY Family History  Problem Relation Age of Onset   Lymphoma Father    Cancer Paternal Grandmother        Colon Cancer   Colon cancer Paternal Grandmother    Colon polyps  Neg Hx    Esophageal cancer Neg Hx    Rectal cancer Neg Hx    Stomach cancer Neg Hx    Diabetes Mellitus I Neg Hx    Pancreatic cancer Neg Hx    SOCIAL HISTORY Social History   Tobacco Use   Smoking status: Former    Types: Cigars    Quit date: 2022    Years since quitting: 2.3   Smokeless tobacco: Never  Vaping Use   Vaping Use: Never used  Substance Use Topics   Alcohol use: Yes    Comment: about once a year   Drug use: No       OPHTHALMIC EXAM: Base Eye Exam     Visual Acuity (Snellen - Linear)  Right Left   Dist Cade 20/100 +2 CF at 3'   Dist ph  20/50          Tonometry (Tonopen, 7:35 AM)       Right Left   Pressure 13 11         Pupils       Dark Light Shape React APD   Right 4 3 Round Sluggish None   Left 4 3 Round Sluggish None         Visual Fields       Left Right     Full   Restrictions Partial outer superior nasal, inferior nasal deficiencies          Extraocular Movement       Right Left    Full, Ortho Full, Ortho         Neuro/Psych     Oriented x3: Yes   Mood/Affect: Normal         Dilation     Both eyes: 1.0% Mydriacyl, 2.5% Phenylephrine @ 7:31 AM           Slit Lamp and Fundus Exam     External Exam       Right Left   External Normal Normal         Slit Lamp Exam       Right Left   Lids/Lashes Dermatochalasis - upper lid, Meibomian gland dysfunction Dermatochalasis - upper lid, Meibomian gland dysfunction   Conjunctiva/Sclera White and quiet White and quiet   Cornea arcus, 1-2+ fine Punctate epithelial erosions, tear film debris arcus, 1-2+ fine Punctate epithelial erosions, tear film debris   Anterior Chamber deep and clear deep and clear   Iris Round and dilated, No NVI Round and dilated, No NVI   Lens 2-3+ Nuclear sclerosis with brunescence, 2+ Cortical cataract 2-3+ Nuclear sclerosis with brunescence, 2-3+ Cortical cataract, 1+ Posterior subcapsular cataract   Anterior Vitreous  Vitreous syneresis, blood stained vitreous condensations -- improved / resolved Vitreous syneresis, diffuse VH - improved, interval improvement in white blood stained vitreous condensations, old white blood clots settled inferiorly, no red heme         Fundus Exam       Right Left   Disc mild Pallor, Sharp rim Compact, fibrosis, mild Pallor   C/D Ratio 0.2 not visible   Macula Flat, good foveal reflex, scatterd MA/DBH, punctate exudates, atrophic +traction, Blunted foveal reflex, grossly flat, scattered MA / DBH, focal exudates temporal macula   Vessels +NVE just inside ST arcades - improved, +fibrosis, attenuated, Tortuous Severe attenuation, Tortuous, +NVD -- regressed to fibrosis; NVE along ST arcades - regressing, +fibrosis   Periphery Attached; scattered MA/DBH, good 360 PRP with room for fill in Hazy view, scattered fibrosis, grossly attached, scattered PRP visible, scattered MA/DBH, 360 PRP with room for fill in           Refraction     Wearing Rx       Sphere Cylinder Axis Add   Right +0.25 +2.00 180 +2.50   Left +0.50 +1.75 009 +2.50           IMAGING AND PROCEDURES  Imaging and Procedures for 05/11/2022  OCT, Retina - OU - Both Eyes       Right Eye Quality was good. Central Foveal Thickness: 249. Progression has improved. Findings include no SRF, abnormal foveal contour, intraretinal hyper-reflective material, intraretinal fluid, vitreous traction, preretinal fibrosis (Partial PVD, interval improvement in IRF -- just scattered cystic changes remain,  persistent PRF with traction, mild interval improvement in vitreous opacities).   Left Eye Quality was poor. Central Foveal Thickness: 200. Progression has improved. Findings include no SRF, abnormal foveal contour, intraretinal hyper-reflective material, intraretinal fluid, vitreous traction, outer retinal atrophy (Mild interval improvement in vitreous opacities, persistent vitreous traction greatest along proximal  arcades and disc, diffuse ORA).   Notes *Images captured and stored on drive  Diagnosis / Impression:  PDR with DME OU OD: Partial PVD, interval improvement in IRF -- just scattered cystic changes remain, persistent PRF with traction, mild interval improvement in vitreous opacities OS: Mild interval improvement in vitreous opacities, persistent vitreous traction greatest along proximal arcades and disc, diffuse ORA  Clinical management:  See below  Abbreviations: NFP - Normal foveal profile. CME - cystoid macular edema. PED - pigment epithelial detachment. IRF - intraretinal fluid. SRF - subretinal fluid. EZ - ellipsoid zone. ERM - epiretinal membrane. ORA - outer retinal atrophy. ORT - outer retinal tubulation. SRHM - subretinal hyper-reflective material. IRHM - intraretinal hyper-reflective material      Intravitreal Injection, Pharmacologic Agent - OD - Right Eye       Time Out 05/11/2022. 8:02 AM. Confirmed correct patient, procedure, site, and patient consented.   Anesthesia Topical anesthesia was used. Anesthetic medications included Lidocaine 2%, Proparacaine 0.5%.   Procedure Preparation included 5% betadine to ocular surface, eyelid speculum. A supplied (32g) needle was used.   Injection: 1.25 mg Bevacizumab 1.25mg /0.49ml   Route: Intravitreal, Site: Right Eye   NDC: P3213405, Lot: [id number], Expiration date: 06/25/2022   Post-op Post injection exam found visual acuity of at least counting fingers. The patient tolerated the procedure well. There were no complications. The patient received written and verbal post procedure care education. Post injection medications were not given.      Intravitreal Injection, Pharmacologic Agent - OS - Left Eye       Time Out 05/11/2022. 8:03 AM. Confirmed correct patient, procedure, site, and patient consented.   Anesthesia Topical anesthesia was used. Anesthetic medications included Lidocaine 2%, Proparacaine 0.5%.    Procedure Preparation included 5% betadine to ocular surface, eyelid speculum. A (32 g) needle was used.   Injection: 1.25 mg Bevacizumab 1.25mg /0.50ml   Route: Intravitreal, Site: Left Eye   NDC: P3213405, Lot: [id number], Expiration date: 07/26/2022   Post-op Post injection exam found visual acuity of at least counting fingers. The patient tolerated the procedure well. There were no complications. The patient received written and verbal post procedure care education. Post injection medications were not given.             ASSESSMENT/PLAN:    ICD-10-CM   1. Proliferative diabetic retinopathy of both eyes with macular edema associated with type 2 diabetes mellitus (HCC)  E11.3513 OCT, Retina - OU - Both Eyes    Intravitreal Injection, Pharmacologic Agent - OD - Right Eye    Intravitreal Injection, Pharmacologic Agent - OS - Left Eye    Bevacizumab (AVASTIN) SOLN 1.25 mg    Bevacizumab (AVASTIN) SOLN 1.25 mg    2. Current use of insulin (HCC)  Z79.4     3. Vitreous hemorrhage of left eye (HCC)  H43.12     4. Essential hypertension  I10     5. Hypertensive retinopathy of both eyes  H35.033     6. Combined forms of age-related cataract of both eyes  H25.813      1-3. Severe proliferative diabetic retinopathy, both eyes  - pt is delayed to follow up from  4 weeks to 11 weeks (02.23.24 to 05.10.24)  - pt was lost to follow up from 11.10.23 to 02.23.24 -- cancelled sx x3  - last A1c7.8 on 10.23.23;  9.7 on 08.18.23; 9.2 on 4.20.23; A1c >15% on 10.11.22 - s/p PRP OS (11.10.22)  - s/p PRP OD (12.02.22)  - s/p IVA OS #1 (01.18.23) for [facility], #2 (03.17.23), #3 (04.14.23), #4 (05.12.23), #5 (06.09.23), #6 (07.07.23), #7 (08.10.23), #8 (09.08.23), #9 (10.06.23) #10 (11.10.23), #11 (02.23.24)  - s/p IVA OD #1 (01.27.23) for macular edema/NVD, #2 (03.17.23), #3 (04.14.23), #4 (05.12.23), #5 (06.09.23), #6 (07.07.23), #7 (08.10.23), #8 (09.08.23), #9 (10.06.23), #10 (11.10.23), #11  (02.23.24) - pt reports improved BG control -- on insulin pump - exam shows extensive neovascularization, fibrosis and hemorrhage OU (OS >>> OD)  - improved VH OU - BCVA 20/50 OD; 20/CF OS -- stable OU -- limited by retinal ischemia - FA (11.09.22) shows areas of vascular non-perfusion OU, +NVD OU and +NVE OU - OCT shows OD: Partial PVD, interval improvement in IRF -- just scattered cystic changes remain, persistent PRF with traction, mild interval improvement in vitreous opacities; OS: Mild interval improvement in vitreous opacities, persistent vitreous traction greatest along proximal arcades and disc, diffuse ORA at 11 weeks - discussed findings, severity of his disease, prognosis and likely need for extensive treatments -- PRP OU, multiple rounds of anti-VEGF therapy OU and possible surgery - recommend IVA OU #12 today, 05.10.24 for PDR and DME OU - pt wishes to proceed with injections - RBA of procedure discussed, questions answered - informed consent obtained and signed, 01.18.23 - see procedure note - f/u 4 weeks, DFE/OCT/FA (transit OS)  4,5. Hypertensive retinopathy OU - discussed importance of tight BP control - monitor   6. Mixed Cataract OU - The symptoms of cataract, surgical options, and treatments and risks were discussed with patient - discussed diagnosis and progression - likely visually significant - will refer back to [facility] for cat eval and potential surgery prior to PPV  Ophthalmic Meds Ordered this visit:  Meds ordered this encounter  Medications   Bevacizumab (AVASTIN) SOLN 1.25 mg   Bevacizumab (AVASTIN) SOLN 1.25 mg     Return in about 4 weeks (around 06/08/2022) for f/u PDR OU, DFE, OCT, FA (transit OS).  There are no Patient Instructions on file for this visit. This document serves as a record of services personally performed by Karie Chimera, MD, PhD. It was created on their behalf by Berlin Hun COT, an ophthalmic technician. The  creation of this record is the provider's dictation and/or activities during the visit.    Electronically signed by: Berlin Hun COT 05/09/2022 12:11 PM  This document serves as a record of services personally performed by Karie Chimera, MD, PhD. It was created on their behalf by Glee Arvin. Manson Passey, OA an ophthalmic technician. The creation of this record is the provider's dictation and/or activities during the visit.    Electronically signed by: Glee Arvin. Manson Passey, [place] 05.10.2024 12:11 PM  Karie Chimera, M.D., Ph.D. Diseases & Surgery of the Retina and Vitreous [facility] 05/11/2022   I have reviewed the above documentation for accuracy and completeness, and I agree with the above. Karie Chimera, M.D., Ph.D. 05/11/22 12:11 PM  Abbreviations: M myopia (nearsighted); A astigmatism; H hyperopia (farsighted); P presbyopia; Mrx spectacle prescription;  CTL contact lenses; OD right eye; OS left eye; OU both eyes  XT exotropia; ET esotropia; PEK punctate epithelial  keratitis; PEE punctate epithelial erosions; DES dry eye syndrome; MGD meibomian gland dysfunction; ATs artificial tears; PFAT's preservative free artificial tears; NSC nuclear sclerotic cataract; PSC posterior subcapsular cataract; ERM epi-retinal membrane; PVD posterior vitreous detachment; RD retinal detachment; DM diabetes mellitus; DR diabetic retinopathy; NPDR non-proliferative diabetic retinopathy; PDR proliferative diabetic retinopathy; CSME clinically significant macular edema; DME diabetic macular edema; dbh dot blot hemorrhages; CWS cotton wool spot; POAG primary open angle glaucoma; C/D cup-to-disc ratio; HVF humphrey visual field; GVF goldmann visual field; OCT optical coherence tomography; IOP intraocular pressure; BRVO Branch retinal vein occlusion; CRVO central retinal vein occlusion; CRAO central retinal artery occlusion; BRAO branch retinal artery occlusion; RT retinal tear; SB scleral buckle; PPV  pars plana vitrectomy; VH Vitreous hemorrhage; PRP panretinal laser photocoagulation; IVK intravitreal kenalog; VMT vitreomacular traction; MH Macular hole;  NVD neovascularization of the disc; NVE neovascularization elsewhere; AREDS age related eye disease study; ARMD age related macular degeneration; POAG primary open angle glaucoma; EBMD epithelial/anterior basement membrane dystrophy; ACIOL anterior chamber intraocular lens; IOL intraocular lens; PCIOL posterior chamber intraocular lens; Phaco/IOL phacoemulsification with intraocular lens placement; PRK photorefractive keratectomy; LASIK laser assisted in situ keratomileusis; HTN hypertension; DM diabetes mellitus; COPD chronic obstructive pulmonary disease

## 2022-05-11 ENCOUNTER — Encounter (INDEPENDENT_AMBULATORY_CARE_PROVIDER_SITE_OTHER): Payer: Self-pay | Admitting: Ophthalmology

## 2022-05-11 ENCOUNTER — Ambulatory Visit (INDEPENDENT_AMBULATORY_CARE_PROVIDER_SITE_OTHER): Payer: PPO | Admitting: Ophthalmology

## 2022-05-11 DIAGNOSIS — H4312 Vitreous hemorrhage, left eye: Secondary | ICD-10-CM

## 2022-05-11 DIAGNOSIS — I10 Essential (primary) hypertension: Secondary | ICD-10-CM | POA: Diagnosis not present

## 2022-05-11 DIAGNOSIS — Z794 Long term (current) use of insulin: Secondary | ICD-10-CM | POA: Diagnosis not present

## 2022-05-11 DIAGNOSIS — H25813 Combined forms of age-related cataract, bilateral: Secondary | ICD-10-CM

## 2022-05-11 DIAGNOSIS — H35033 Hypertensive retinopathy, bilateral: Secondary | ICD-10-CM

## 2022-05-11 DIAGNOSIS — E113513 Type 2 diabetes mellitus with proliferative diabetic retinopathy with macular edema, bilateral: Secondary | ICD-10-CM | POA: Diagnosis not present

## 2022-05-11 MED ORDER — BEVACIZUMAB CHEMO INJECTION 1.25MG/0.05ML SYRINGE FOR KALEIDOSCOPE
1.2500 mg | INTRAVITREAL | Status: AC | PRN
  Administered 2022-05-11: 1.25 mg via INTRAVITREAL

## 2022-05-15 ENCOUNTER — Ambulatory Visit: Payer: HMO | Admitting: Internal Medicine

## 2022-05-15 NOTE — Progress Notes (Deleted)
Name: [name] [name]  Age/ Sex: 67 y.o., male   MRN/ DOB: [id number], 02-May-1955     PCP: Bary Leriche, PA-C   Reason for Endocrinology Evaluation: Type 2 Diabetes Mellitus  Initial Endocrine Consultative Visit: 07/15/2020    PATIENT IDENTIFIER: [name] [name] is a 67 y.o. male with a past medical history of DM, CAD, HTN, PVD, PAF. The patient has followed with Endocrinology clinic since 07/15/2020 for consultative assistance with management of his diabetes.  DIABETIC HISTORY:  [name] [name] was diagnosed with DM in 2011, and started insulin therapy in 2013. His hemoglobin A1c has ranged from 9.2% in 2023, peaking at >15.0% in 2022.  Saw Dr. Everardo All last in 04/2021 SUBJECTIVE:   During the last visit (11/17/2021): A1c 9.7%  Today (05/15/2022): [name] [name] is here for a follow up on diabetes management.  He was recently seen by ophthalmology and was requested to evaluate his pump settings pending surgical intervention of the left eye due to vitreous hemorrhage.  He checks his blood sugars multiple  times daily. The patient has not had hypoglycemic episodes since the last clinic visit.  Had a follow up with Podiatry 03/05/2022 Had a F/U with cardiology 02/2022, had an ED visit for bradycardia 01/2022 Denies nausea, vomiting  Chronic diarrhea is improving     This patient with type 2 diabetes is treated with Omnipod  (insulin pump). During the visit the pump basal and bolus doses were reviewed including carb/insulin rations and supplemental doses. The clinical list was updated. The glucose meter download was reviewed in detail to determine if the current pump settings are providing the best glycemic control without excessive hypoglycemia.  Pump and meter download:  Pump   Omnipod Settings   Insulin type   Novolog    Basal rate       0000 0.7              I:C ratio       0000 1:1                  Sensitivity       0000  60      Goal       0000  120              Type & Model of Pump: Omnipod  Insulin Type: Currently using Novolog .    PUMP STATISTICS: Average BG: 273 Average Daily Carbs (g): 6.6 Average Total Daily Insulin: 14.7 Average Daily Basal: 11.4 (78 %) Average Daily Bolus: 3.3 (22 %)        HOME DIABETES REGIMEN:  Novolog   Statin: yes ACE-I/ARB: yes    CONTINUOUS GLUCOSE MONITORING RECORD INTERPRETATION    Dates of Recording: 11/4-11/17/2023  Sensor description:dexcom  Results statistics:   CGM use % of time 55.6  Average and SD 273  Time in range 17 %  % Time Above 180 23  % Time above 250 59  % Time Below target 1      Glycemic patterns summary: Hyperglycemia noted during the day and night   Hyperglycemic episodes  all day ad night   Hypoglycemic episodes occurred at night   Overnight periods: variable      DIABETIC COMPLICATIONS: Microvascular complications:  Retinopathy B/L , blind in left eye  Denies: CKD Last Eye Exam: Completed 05/11/2022  Macrovascular complications:  CAD (S/P CABG) Denies: CVA,   HISTORY:  Past Medical History:  Past Medical History:  Diagnosis Date  Anxiety    Arthritis    Cataract    CHF (congestive heart failure) (HCC)    Constipation    pt states goes EOD- hard stools    Coronary artery disease    Depression    Diabetes mellitus without complication (HCC)    Diabetic retinopathy (HCC)    DKA (diabetic ketoacidoses)    Hyperlipidemia    Hypertension    off meds   Hypertensive retinopathy    Postherpetic neuralgia 08/11/2021   Speculative diagnosis   Psoriasis    Tubular adenoma of colon 03/2019   Past Surgical History:  Past Surgical History:  Procedure Laterality Date   ABDOMINAL AORTOGRAM W/LOWER EXTREMITY N/A 08/30/2020   Procedure: ABDOMINAL AORTOGRAM W/LOWER EXTREMITY;  Surgeon: Nada Libman, MD;  [facility];  Service: Cardiovascular;  Laterality: N/A;   ABDOMINAL AORTOGRAM W/LOWER EXTREMITY N/A  11/15/2020   Procedure: ABDOMINAL AORTOGRAM W/LOWER EXTREMITY;  Surgeon: Nada Libman, MD;  [facility];  Service: Cardiovascular;  Laterality: N/A;   ANKLE SURGERY     right   CARDIAC CATHETERIZATION  09/25/2001   COLONOSCOPY     ~10 yr ago- normal  per pt    CORONARY ARTERY BYPASS GRAFT  09/30/2001   CABG x 4   IR THORACENTESIS ASP PLEURAL SPACE W/IMG GUIDE  05/19/2020   LAPAROSCOPIC INGUINAL HERNIA REPAIR Bilateral 02/09/2010   MASS EXCISION Left 03/12/2013   Procedure: LEFT INDEX EXCISION MASS AND DEBRIDMENT DISTAL INTERPHALANGEAL JOINT;  Surgeon: Tami Ribas, MD;  Location: [facility];  Service: Orthopedics;  Laterality: Left;   PERIPHERAL VASCULAR BALLOON ANGIOPLASTY Left 08/30/2020   Procedure: PERIPHERAL VASCULAR BALLOON ANGIOPLASTY;  Surgeon: Nada Libman, MD;  [facility];  Service: Cardiovascular;  Laterality: Left;  Peroneal   Social History:  reports that he quit smoking about 2 years ago. His smoking use included cigars. He has never used smokeless tobacco. He reports current alcohol use. He reports that he does not use drugs. Family History:  Family History  Problem Relation Age of Onset   Lymphoma Father    Cancer Paternal Grandmother        Colon Cancer   Colon cancer Paternal Grandmother    Colon polyps Neg Hx    Esophageal cancer Neg Hx    Rectal cancer Neg Hx    Stomach cancer Neg Hx    Diabetes Mellitus I Neg Hx    Pancreatic cancer Neg Hx      HOME MEDICATIONS: Allergies as of 05/15/2022       Reactions   Wool Alcohol [lanolin] Hives, Itching   Wool fabric        Medication List        Accurate as of May 15, 2022  9:14 AM. If you have any questions, ask your nurse or doctor.          acetaminophen 500 MG tablet Commonly known as: TYLENOL Take 500 mg by mouth as needed for mild pain or moderate pain.   apixaban 5 MG Tabs tablet Commonly known as: Eliquis Take 1 tablet (5 mg total) by  mouth 2 (two) times daily.   aspirin EC 81 MG tablet Take 1 tablet (81 mg total) by mouth daily. What changed: when to take this   atorvastatin 40 MG tablet Commonly known as: LIPITOR TAKE 1 TABLET BY MOUTH EVERY DAY   BD Pen Needle Nano 2nd Gen 32G X 4 MM Misc Generic drug:  Insulin Pen Needle USE FOR INSULIN   Dexcom G6 Sensor Misc 1 Device by Does not apply route See admin instructions. Change every 10 days   Dexcom G6 Transmitter Misc Use as instructed to check blood sugar   Disposable Brief Large Misc 1 each by Does not apply route as needed.   furosemide 40 MG tablet Commonly known as: LASIX TAKE 1 TABLET BY MOUTH EVERY DAY   lidocaine 5 % ointment Commonly known as: XYLOCAINE Apply 1 Application topically as needed.   losartan 50 MG tablet Commonly known as: COZAAR Take 1 tablet (50 mg total) by mouth daily.   mirtazapine 15 MG tablet Commonly known as: REMERON TAKE 1 TABLET BY MOUTH EVERYDAY AT BEDTIME   multivitamin with minerals tablet Take 1 tablet by mouth daily.   NovoLOG FlexPen 100 UNIT/ML FlexPen Generic drug: insulin aspart Inject 4 Units into the skin 2 (two) times daily. When pump is not attached. What changed: Another medication with the same name was changed. Make sure you understand how and when to take each.   NovoLOG 100 UNIT/ML injection Generic drug: insulin aspart INJECT 50 UNITS INTO THE SKIN SEE ADMIN INSTRUCTIONS. PT LOADS PUMP WITH 50 UNITS EVERY 3 DAYS What changed: See the new instructions.   Omnipod 5 G6 Intro (Gen 5) Kit 1 Device by Does not apply route every 3 (three) days.   Omnipod 5 G6 Pods (Gen 5) Misc APPLY EVERY 3 (THREE) DAYS.   PARoxetine 20 MG tablet Commonly known as: PAXIL TAKE 1 TABLET BY MOUTH EVERY DAY What changed: when to take this   spironolactone 25 MG tablet Commonly known as: ALDACTONE TAKE 1 TABLET BY MOUTH EVERY DAY   Voltaren 1 % Gel Generic drug: diclofenac Sodium Apply 1 Application  topically as needed (pain).         OBJECTIVE:   Vital Signs: There were no vitals taken for this visit.  Wt Readings from Last 3 Encounters:  02/27/22 169 lb (76.7 kg)  01/06/22 167 lb 12.3 oz (76.1 kg)  11/17/21 165 lb (74.8 kg)     Exam: General: Pt appears well and is in NAD  Lungs: Clear with good BS bilat with no rales, rhonchi, or wheezes  Heart: RRR   Extremities: 1+ pretibial edema.   Neuro: MS is good with appropriate affect, pt is alert and Ox3   Dm Foot Exam 03/05/2022 per podiatry      DATA REVIEWED:  Lab Results  Component Value Date   HGBA1C 7.8 (H) 10/23/2021   HGBA1C 9.7 (A) 08/18/2021   HGBA1C 9.2 (A) 04/20/2021    Latest Reference Range & Units 10/23/21 09:36  Sodium 135 - 145 mmol/L 136  Potassium 3.5 - 5.1 mmol/L 3.5  Chloride 98 - 111 mmol/L 103  CO2 22 - 32 mmol/L 28  Glucose 70 - 99 mg/dL 161 (H)  Mean Plasma Glucose mg/dL 096.04  BUN 8 - 23 mg/dL 13  Creatinine 5.40 - 9.81 mg/dL 1.91  Calcium 8.9 - 47.8 mg/dL 8.2 (L)  Anion gap 5 - 15  5  Alkaline Phosphatase 38 - 126 U/L 91  Albumin 3.5 - 5.0 g/dL 2.8 (L)  AST 15 - 41 U/L 18  ALT 0 - 44 U/L 13  Total Protein 6.5 - 8.1 g/dL 6.1 (L)  Total Bilirubin 0.3 - 1.2 mg/dL 0.5  GFR, Estimated >29 mL/min >60    Latest Reference Range & Units 10/23/21 09:36  Glucose 70 - 99 mg/dL 562 (H)  Hemoglobin  A1C 4.8 - 5.6 % 7.8 (H)      ASSESSMENT / PLAN / RECOMMENDATIONS:   1) Type 2 Diabetes Mellitus, Poorly controlled, With retinopathic , neuropathic, retinopathic and macrovascular  complications - Most recent A1c of 7.8 %. Goal A1c < 7.0 %.     -A1c has trended down from 9.7% to 7.8% -In reviewing his pump download, he has been noted with hyperglycemia but it appears that he is having disruption of insulin through the pump . I have advised the pt that he will need to remove the pump and change location as probably the cannula is bent.   - Today I have set  up the temporary basal to start  at MN on 11/30th by reducing . Step by step instruction written on the AVS     MEDICATIONS: NovoLog     Pump   Omnipod Settings   Insulin type   Novolog    Basal rate       0000 0.7              I:C ratio       0000 1:1                  Sensitivity       0000  60      Goal       0000  120            EDUCATION / INSTRUCTIONS: BG monitoring instructions: Patient is instructed to check his blood sugars before times a day, before each meal and bedtime. Call [facility] if: BG persistently < 70  I reviewed the Rule of 15 for the treatment of hypoglycemia in detail with the patient. Literature supplied.     2) Diabetic complications:  Eye: Does  have known diabetic retinopathy.  Neuro/ Feet: Does not have known diabetic peripheral neuropathy .  Renal: Patient does not have known baseline CKD. He   is  on an ACEI/ARB at present.       Signed electronically by: Lyndle Herrlich, MD  [facility]  [facility] [address] [place], [place] [address] Phone: [phone] FAX: [phone]   CC: [name], [name], PA-C [address] [place] [address] Phone: [phone]  Fax: [phone]  Return to Endocrinology clinic as below: Future Appointments  Date Time Provider Department Center  05/15/2022  2:20 PM Ineze Serrao, Konrad Dolores, MD [facility] None  05/21/2022  9:00 AM [facility] 4 [facility]  06/05/2022  4:15 PM Helane Gunther, DPM [facility]  06/08/2022  7:45 AM Rennis Chris, MD [facility] None  08/21/2022  8:20 AM O'Neal, Ronnald Ramp, MD [facility] None

## 2022-05-18 ENCOUNTER — Telehealth: Payer: Self-pay | Admitting: Internal Medicine

## 2022-05-18 ENCOUNTER — Other Ambulatory Visit: Payer: Self-pay | Admitting: Internal Medicine

## 2022-05-18 DIAGNOSIS — E1165 Type 2 diabetes mellitus with hyperglycemia: Secondary | ICD-10-CM

## 2022-05-18 MED ORDER — OMNIPOD 5 DEXG7G6 PODS GEN 5 MISC
0 refills | Status: DC
Start: 2022-05-18 — End: 2022-09-05

## 2022-05-18 NOTE — Telephone Encounter (Signed)
MEDICATION:  Omnipod 5 G6 Pods (Gen 5) Insulin Disposable Pump (OMNIPOD 5 G6 PODS, GEN 5,  PHARMACY:    [id number] - [place], [place] - [address] (Ph: [phone])    HAS THE PATIENT CONTACTED THEIR PHARMACY?  Yes  IS THIS A 90 DAY SUPPLY : Yes  IS PATIENT OUT OF MEDICATION: NO  IF NOT; HOW MUCH IS LEFT:  On last one will be out SUnday  LAST APPOINTMENT DATE: @11 /17/2023  NEXT APPOINTMENT DATE:@6 /07/2022  DO WE HAVE YOUR PERMISSION TO LEAVE A DETAILED MESSAGE?:Yes  OTHER COMMENTS:    **Let patient know to contact pharmacy at the end of the day to make sure medication is ready. **  ** Please notify patient to allow 48-72 hours to process**  **Encourage patient to contact the pharmacy for refills or they can request refills through [facility]**

## 2022-05-18 NOTE — Telephone Encounter (Signed)
done

## 2022-05-21 ENCOUNTER — Ambulatory Visit (HOSPITAL_COMMUNITY): Admission: RE | Admit: 2022-05-21 | Payer: PPO | Source: Ambulatory Visit

## 2022-05-25 DIAGNOSIS — E1142 Type 2 diabetes mellitus with diabetic polyneuropathy: Secondary | ICD-10-CM | POA: Diagnosis not present

## 2022-05-25 DIAGNOSIS — E1151 Type 2 diabetes mellitus with diabetic peripheral angiopathy without gangrene: Secondary | ICD-10-CM | POA: Diagnosis not present

## 2022-05-25 DIAGNOSIS — Z951 Presence of aortocoronary bypass graft: Secondary | ICD-10-CM | POA: Diagnosis not present

## 2022-05-25 DIAGNOSIS — Z7901 Long term (current) use of anticoagulants: Secondary | ICD-10-CM | POA: Diagnosis not present

## 2022-05-25 DIAGNOSIS — D6869 Other thrombophilia: Secondary | ICD-10-CM | POA: Diagnosis not present

## 2022-05-25 DIAGNOSIS — E113513 Type 2 diabetes mellitus with proliferative diabetic retinopathy with macular edema, bilateral: Secondary | ICD-10-CM | POA: Diagnosis not present

## 2022-05-25 DIAGNOSIS — I4819 Other persistent atrial fibrillation: Secondary | ICD-10-CM | POA: Diagnosis not present

## 2022-05-25 DIAGNOSIS — Z794 Long term (current) use of insulin: Secondary | ICD-10-CM | POA: Diagnosis not present

## 2022-05-25 DIAGNOSIS — Z7982 Long term (current) use of aspirin: Secondary | ICD-10-CM | POA: Diagnosis not present

## 2022-05-25 DIAGNOSIS — I2510 Atherosclerotic heart disease of native coronary artery without angina pectoris: Secondary | ICD-10-CM | POA: Diagnosis not present

## 2022-05-31 ENCOUNTER — Telehealth: Payer: Self-pay | Admitting: Physician Assistant

## 2022-05-31 DIAGNOSIS — S91302D Unspecified open wound, left foot, subsequent encounter: Secondary | ICD-10-CM | POA: Diagnosis not present

## 2022-05-31 NOTE — Telephone Encounter (Signed)
Please see call note and advise 

## 2022-05-31 NOTE — Telephone Encounter (Signed)
[facility] states pt - saw pt for wound to left heel, no sign of infection, recommendation, looks clean, follow up with primary care - send to wound care if neded- Blood sugar in 300 today, pump unreliable -    [phone] - Ramie

## 2022-06-01 ENCOUNTER — Telehealth: Payer: Self-pay | Admitting: Physician Assistant

## 2022-06-01 NOTE — Telephone Encounter (Signed)
Pt's son is asking to have Home Health referral to help with wound care. Also asking about transportation for appts. Advised to check with insurance first for the transportation.

## 2022-06-01 NOTE — Telephone Encounter (Signed)
Pt needs to have a visit face to face with first available in the office per PCP.

## 2022-06-01 NOTE — Telephone Encounter (Signed)
Pt has been scheduled for 6/5 @ 8:20am w/ Jarold Motto.

## 2022-06-01 NOTE — Telephone Encounter (Signed)
Please see pt family request and advise

## 2022-06-01 NOTE — Telephone Encounter (Signed)
Pt scheduled to see [facility] 06/06/22

## 2022-06-05 ENCOUNTER — Ambulatory Visit (INDEPENDENT_AMBULATORY_CARE_PROVIDER_SITE_OTHER): Payer: PPO | Admitting: Podiatry

## 2022-06-05 VITALS — BP 155/78 | HR 60 | Temp 97.7°F

## 2022-06-05 DIAGNOSIS — M79675 Pain in left toe(s): Secondary | ICD-10-CM | POA: Diagnosis not present

## 2022-06-05 DIAGNOSIS — L97422 Non-pressure chronic ulcer of left heel and midfoot with fat layer exposed: Secondary | ICD-10-CM

## 2022-06-05 DIAGNOSIS — M79674 Pain in right toe(s): Secondary | ICD-10-CM

## 2022-06-05 DIAGNOSIS — I739 Peripheral vascular disease, unspecified: Secondary | ICD-10-CM | POA: Diagnosis not present

## 2022-06-05 DIAGNOSIS — B351 Tinea unguium: Secondary | ICD-10-CM | POA: Diagnosis not present

## 2022-06-05 DIAGNOSIS — E1165 Type 2 diabetes mellitus with hyperglycemia: Secondary | ICD-10-CM | POA: Diagnosis not present

## 2022-06-05 MED ORDER — MUPIROCIN 2 % EX OINT
1.0000 | TOPICAL_OINTMENT | Freq: Every day | CUTANEOUS | 2 refills | Status: DC
Start: 2022-06-05 — End: 2022-11-20

## 2022-06-05 NOTE — Progress Notes (Signed)
[name] [name] is a 67 y.o. male here for a new problem.  History of Present Illness:   No chief complaint on file.   HPI  Requesting Home Health referral to help with wound care.  Interested in options for transport to appointments.   Past Medical History:  Diagnosis Date   Anxiety    Arthritis    Cataract    CHF (congestive heart failure) (HCC)    Constipation    pt states goes EOD- hard stools    Coronary artery disease    Depression    Diabetes mellitus without complication (HCC)    Diabetic retinopathy (HCC)    DKA (diabetic ketoacidoses)    Hyperlipidemia    Hypertension    off meds   Hypertensive retinopathy    Postherpetic neuralgia 08/11/2021   Speculative diagnosis   Psoriasis    Tubular adenoma of colon 03/2019     Social History   Tobacco Use   Smoking status: Former    Types: Cigars    Quit date: 2022    Years since quitting: 2.4   Smokeless tobacco: Never  Vaping Use   Vaping Use: Never used  Substance Use Topics   Alcohol use: Yes    Comment: about once a year   Drug use: No    Past Surgical History:  Procedure Laterality Date   ABDOMINAL AORTOGRAM W/LOWER EXTREMITY N/A 08/30/2020   Procedure: ABDOMINAL AORTOGRAM W/LOWER EXTREMITY;  Surgeon: Nada Libman, MD;  [facility];  Service: Cardiovascular;  Laterality: N/A;   ABDOMINAL AORTOGRAM W/LOWER EXTREMITY N/A 11/15/2020   Procedure: ABDOMINAL AORTOGRAM W/LOWER EXTREMITY;  Surgeon: Nada Libman, MD;  [facility];  Service: Cardiovascular;  Laterality: N/A;   ANKLE SURGERY     right   CARDIAC CATHETERIZATION  09/25/2001   COLONOSCOPY     ~10 yr ago- normal  per pt    CORONARY ARTERY BYPASS GRAFT  09/30/2001   CABG x 4   IR THORACENTESIS ASP PLEURAL SPACE W/IMG GUIDE  05/19/2020   LAPAROSCOPIC INGUINAL HERNIA REPAIR Bilateral 02/09/2010   MASS EXCISION Left 03/12/2013   Procedure: LEFT INDEX EXCISION MASS AND DEBRIDMENT DISTAL INTERPHALANGEAL  JOINT;  Surgeon: Tami Ribas, MD;  Location: [facility];  Service: Orthopedics;  Laterality: Left;   PERIPHERAL VASCULAR BALLOON ANGIOPLASTY Left 08/30/2020   Procedure: PERIPHERAL VASCULAR BALLOON ANGIOPLASTY;  Surgeon: Nada Libman, MD;  [facility];  Service: Cardiovascular;  Laterality: Left;  Peroneal    Family History  Problem Relation Age of Onset   Lymphoma Father    Cancer Paternal Grandmother        Colon Cancer   Colon cancer Paternal Grandmother    Colon polyps Neg Hx    Esophageal cancer Neg Hx    Rectal cancer Neg Hx    Stomach cancer Neg Hx    Diabetes Mellitus I Neg Hx    Pancreatic cancer Neg Hx     Allergies  Allergen Reactions   Wool Alcohol [Lanolin] Hives and Itching    Wool fabric    Current Medications:   Current Outpatient Medications:    acetaminophen (TYLENOL) 500 MG tablet, Take 500 mg by mouth as needed for mild pain or moderate pain., Disp: , Rfl:    apixaban (ELIQUIS) 5 MG TABS tablet, Take 1 tablet (5 mg total) by mouth 2 (two) times daily., Disp: 180 tablet, Rfl: 1   aspirin EC 81 MG EC  tablet, Take 1 tablet (81 mg total) by mouth daily. (Patient taking differently: Take 81 mg by mouth in the morning.), Disp: 30 tablet, Rfl: 0   atorvastatin (LIPITOR) 40 MG tablet, TAKE 1 TABLET BY MOUTH EVERY DAY, Disp: 90 tablet, Rfl: 1   BD PEN NEEDLE NANO 2ND GEN 32G X 4 MM MISC, USE FOR INSULIN, Disp: 300 each, Rfl: 1   Continuous Blood Gluc Sensor (DEXCOM G6 SENSOR) MISC, 1 Device by Does not apply route See admin instructions. Change every 10 days, Disp: 9 each, Rfl: 3   Continuous Blood Gluc Transmit (DEXCOM G6 TRANSMITTER) MISC, Use as instructed to check blood sugar, Disp: 1 each, Rfl: 1   diclofenac Sodium (VOLTAREN) 1 % GEL, Apply 1 Application topically as needed (pain)., Disp: , Rfl:    furosemide (LASIX) 40 MG tablet, TAKE 1 TABLET BY MOUTH EVERY DAY, Disp: 90 tablet, Rfl: 1   Incontinence Supply Disposable  (DISPOSABLE BRIEF LARGE) MISC, 1 each by Does not apply route as needed., Disp: 36 each, Rfl: 5   insulin aspart (NOVOLOG FLEXPEN) 100 UNIT/ML FlexPen, Inject 4 Units into the skin 2 (two) times daily. When pump is not attached., Disp: , Rfl:    Insulin Disposable Pump (OMNIPOD 5 G6 INTRO, GEN 5,) KIT, 1 Device by Does not apply route every 3 (three) days., Disp: 30 kit, Rfl: 3   Insulin Disposable Pump (OMNIPOD 5 G6 PODS, GEN 5,) MISC, APPLY EVERY 3 (THREE) DAYS., Disp: 30 each, Rfl: 0   lidocaine (XYLOCAINE) 5 % ointment, Apply 1 Application topically as needed., Disp: 35.44 g, Rfl: 0   losartan (COZAAR) 50 MG tablet, Take 1 tablet (50 mg total) by mouth daily., Disp: 30 tablet, Rfl: 2   mirtazapine (REMERON) 15 MG tablet, TAKE 1 TABLET BY MOUTH EVERYDAY AT BEDTIME, Disp: 90 tablet, Rfl: 1   Multiple Vitamins-Minerals (MULTIVITAMIN WITH MINERALS) tablet, Take 1 tablet by mouth daily., Disp: , Rfl:    mupirocin ointment (BACTROBAN) 2 %, Apply 1 Application topically daily., Disp: 30 g, Rfl: 2   NOVOLOG 100 UNIT/ML injection, INJECT 50 UNITS INTO THE SKIN SEE ADMIN INSTRUCTIONS. PT LOADS PUMP WITH 50 UNITS EVERY 3 DAYS (Patient taking differently: Inject 110 Units into the skin See admin instructions. Inject 110 units into pod every three days), Disp: 20 mL, Rfl: 1   PARoxetine (PAXIL) 20 MG tablet, TAKE 1 TABLET BY MOUTH EVERY DAY (Patient taking differently: Take 20 mg by mouth in the morning.), Disp: 90 tablet, Rfl: 1   spironolactone (ALDACTONE) 25 MG tablet, TAKE 1 TABLET BY MOUTH EVERY DAY, Disp: 90 tablet, Rfl: 1   Review of Systems:   ROS  Vitals:   There were no vitals filed for this visit.   There is no height or weight on file to calculate BMI.  Physical Exam:   Physical Exam  Assessment and Plan:   ***   I,Alexander Ruley,acting as a scribe for Jarold Motto, PA.,have documented all relevant documentation on the behalf of Jarold Motto, PA,as directed by  Jarold Motto, PA while in the presence of Jarold Motto, [place].   ***   Jarold Motto, PA-C

## 2022-06-06 ENCOUNTER — Encounter: Payer: Self-pay | Admitting: Physician Assistant

## 2022-06-06 ENCOUNTER — Ambulatory Visit (INDEPENDENT_AMBULATORY_CARE_PROVIDER_SITE_OTHER): Payer: PPO | Admitting: Physician Assistant

## 2022-06-06 ENCOUNTER — Encounter (HOSPITAL_BASED_OUTPATIENT_CLINIC_OR_DEPARTMENT_OTHER): Payer: Self-pay | Admitting: General Surgery

## 2022-06-06 VITALS — BP 130/86 | HR 51 | Temp 98.2°F | Ht 74.0 in | Wt 165.0 lb

## 2022-06-06 DIAGNOSIS — L97422 Non-pressure chronic ulcer of left heel and midfoot with fat layer exposed: Secondary | ICD-10-CM | POA: Diagnosis not present

## 2022-06-06 DIAGNOSIS — E1165 Type 2 diabetes mellitus with hyperglycemia: Secondary | ICD-10-CM | POA: Diagnosis not present

## 2022-06-06 NOTE — Progress Notes (Signed)
  Subjective:  Patient ID: [name] [name], male    DOB: 07/19/1955,  MRN: [id number]  Chief Complaint  Patient presents with   Diabetic Ulcer    Left heel ulcer, patient states its been there about a week    67 y.o. male presents with the above complaint. History confirmed with patient.  He describes are not sure how it started.  His nails are also thickened and elongated causing pain.  Intermittent debridement has been helpful.  Objective:  Physical Exam: warm, good capillary refill, thick and elongated yellow dystrophic nails with subungual debris, he has a full-thickness plantar medial heel ulceration without signs of infection, fat layer exposed.  Assessment:   1. Heel ulcer, left, with fat layer exposed (HCC)   2. Pain due to onychomycosis of toenails of both feet   3. PVD (peripheral vascular disease) (HCC)   4. Uncontrolled type 2 diabetes mellitus with hyperglycemia (HCC)      Plan:  Patient was evaluated and treated and all questions answered.  Discussed the etiology and treatment options for the condition in detail with the patient. Recommended debridement of the nails today. Sharp and mechanical debridement performed of all painful and mycotic nails today. Nails debrided in length and thickness using a nail nipper to level of comfort. Discussed treatment options including appropriate shoe gear. Follow up as needed for painful nails.  There is a new ulceration on the left heel.  Appears to be free of signs of infection and healing well.  Referral to wound care center placed.  Dress daily with mupirocin ointment.  No indication for oral antibiotics.  Follow-up as needed.  Return in about 3 months (around 09/05/2022) for at risk diabetic foot care.

## 2022-06-06 NOTE — Patient Instructions (Addendum)
It was great to see you!  I'm going to place urgent referral to home health wound care If you do not get a phone call by Friday -- message or call me or Alyssa.  In the meantime, if ANY concerns for infection (fever, chills, worsening drainage) -- call us ASAP  Take care,  Jarold Motto PA-C

## 2022-06-08 ENCOUNTER — Ambulatory Visit (INDEPENDENT_AMBULATORY_CARE_PROVIDER_SITE_OTHER): Payer: PPO | Admitting: Internal Medicine

## 2022-06-08 ENCOUNTER — Encounter: Payer: Self-pay | Admitting: Internal Medicine

## 2022-06-08 ENCOUNTER — Encounter (INDEPENDENT_AMBULATORY_CARE_PROVIDER_SITE_OTHER): Payer: PPO | Admitting: Ophthalmology

## 2022-06-08 VITALS — BP 140/82 | HR 57 | Ht 74.0 in | Wt 165.6 lb

## 2022-06-08 DIAGNOSIS — E1142 Type 2 diabetes mellitus with diabetic polyneuropathy: Secondary | ICD-10-CM | POA: Diagnosis not present

## 2022-06-08 DIAGNOSIS — E1165 Type 2 diabetes mellitus with hyperglycemia: Secondary | ICD-10-CM

## 2022-06-08 DIAGNOSIS — Z794 Long term (current) use of insulin: Secondary | ICD-10-CM

## 2022-06-08 DIAGNOSIS — E1042 Type 1 diabetes mellitus with diabetic polyneuropathy: Secondary | ICD-10-CM

## 2022-06-08 LAB — POCT GLYCOSYLATED HEMOGLOBIN (HGB A1C): Hemoglobin A1C: 11.2 % — AB (ref 4.0–5.6)

## 2022-06-08 MED ORDER — INSULIN ASPART 100 UNIT/ML IJ SOLN: INTRAMUSCULAR | 3 refills | Status: DC

## 2022-06-08 NOTE — Patient Instructions (Signed)
Please enter 5 g into the pump each time you eat a meal with breakfast, lunch, and dinner   HOW TO TREAT LOW BLOOD SUGARS (Blood sugar LESS THAN 70 MG/DL) Please follow the RULE OF 15 for the treatment of hypoglycemia treatment (when your (blood sugars are less than 70 mg/dL)   STEP 1: Take 15 grams of carbohydrates when your blood sugar is low, which includes:  3-4 GLUCOSE TABS  OR 3-4 OZ OF JUICE OR REGULAR SODA OR ONE TUBE OF GLUCOSE GEL    STEP 2: RECHECK blood sugar in 15 MINUTES STEP 3: If your blood sugar is still low at the 15 minute recheck --> then, go back to STEP 1 and treat AGAIN with another 15 grams of carbohydrates.

## 2022-06-08 NOTE — Progress Notes (Signed)
Name: [name] [name]  Age/ Sex: 67 y.o., male   MRN/ DOB: [id number], 1955-05-05     PCP: Bary Leriche, PA-C   Reason for Endocrinology Evaluation: Type 2 Diabetes Mellitus  Initial Endocrine Consultative Visit: 07/15/2020    PATIENT IDENTIFIER: [name] [name] is a 67 y.o. male with a past medical history of DM, CAD, HTN, PVD, PAF. The patient has followed with Endocrinology clinic since 07/15/2020 for consultative assistance with management of his diabetes.  DIABETIC HISTORY:  [name] [name] was diagnosed with DM in 2011, and started insulin therapy in 2013. His hemoglobin A1c has ranged from 9.2% in 2023, peaking at >15.0% in 2022.  Saw Dr. Everardo All last in 04/2021 SUBJECTIVE:   During the last visit (11/17/2021): A1c 9.7%  Today (06/08/2022): [name] [name] is here for a follow up on diabetes management.  He was recently seen by ophthalmology and was requested to evaluate his pump settings pending surgical intervention of the left eye due to vitreous hemorrhage.  He checks his blood sugars multiple  times daily. The patient has not had hypoglycemic episodes since the last clinic visit.  Had a follow up with Podiatry 06/06/2022 for left heel ulcer Had a F/U with cardiology 02/2022, had an ED visit for bradycardia 01/2022  Denies fever  Denies nausea, vomiting  Chronic diarrhea  resolved with Mediterranean diet     This patient with type 2 diabetes is treated with Omnipod  (insulin pump). During the visit the pump basal and bolus doses were reviewed including carb/insulin rations and supplemental doses. The clinical list was updated. The glucose meter download was reviewed in detail to determine if the current pump settings are providing the best glycemic control without excessive hypoglycemia.  Pump and meter download:  Pump   Omnipod Settings   Insulin type   Novolog    Basal rate       0000 0.7              I:C ratio       0000 1:1                  Sensitivity        0000  60      Goal       0000  120             Type & Model of Pump: Omnipod  Insulin Type: Currently using Novolog .    PUMP STATISTICS: Average BG: 339 Average Daily Carbs (g): 6.5 Average Total Daily Insulin: 3.5 Average Daily Basal: 3.1 (86%) Average Daily Bolus: 0.5 (14%)        HOME DIABETES REGIMEN:  Novolog   Statin: yes ACE-I/ARB: yes    CONTINUOUS GLUCOSE MONITORING RECORD INTERPRETATION    Dates of Recording: 5/18-5/31/2024  Sensor description:dexcom  Results statistics:   CGM use % of time 14  Average and SD 361/23  Time in range 0 %  % Time Above 180 0  % Time above 250 100  % Time Below target 0      Glycemic patterns summary: Hyperglycemia noted during the day and night   Hyperglycemic episodes all day and night  Hypoglycemic episodes occurred N/A  Overnight periods: High     DIABETIC COMPLICATIONS: Microvascular complications:  Retinopathy B/L , blind in left eye  Denies: CKD Last Eye Exam: Completed 05/11/2022  Macrovascular complications:  CAD (S/P CABG) Denies: CVA,   HISTORY:  Past Medical History:  Past Medical History:  Diagnosis Date   Anxiety    Arthritis    Cataract    CHF (congestive heart failure) (HCC)    Constipation    pt states goes EOD- hard stools    Coronary artery disease    Depression    Diabetes mellitus without complication (HCC)    Diabetic retinopathy (HCC)    DKA (diabetic ketoacidoses)    Hyperlipidemia    Hypertension    off meds   Hypertensive retinopathy    Postherpetic neuralgia 08/11/2021   Speculative diagnosis   Psoriasis    Tubular adenoma of colon 03/2019   Past Surgical History:  Past Surgical History:  Procedure Laterality Date   ABDOMINAL AORTOGRAM W/LOWER EXTREMITY N/A 08/30/2020   Procedure: ABDOMINAL AORTOGRAM W/LOWER EXTREMITY;  Surgeon: Nada Libman, MD;  Location: [facility];  Service: Cardiovascular;  Laterality: N/A;   ABDOMINAL  AORTOGRAM W/LOWER EXTREMITY N/A 11/15/2020   Procedure: ABDOMINAL AORTOGRAM W/LOWER EXTREMITY;  Surgeon: Nada Libman, MD;  Location: [facility];  Service: Cardiovascular;  Laterality: N/A;   ANKLE SURGERY     right   CARDIAC CATHETERIZATION  09/25/2001   COLONOSCOPY     ~10 yr ago- normal  per pt    CORONARY ARTERY BYPASS GRAFT  09/30/2001   CABG x 4   IR THORACENTESIS ASP PLEURAL SPACE W/IMG GUIDE  05/19/2020   LAPAROSCOPIC INGUINAL HERNIA REPAIR Bilateral 02/09/2010   MASS EXCISION Left 03/12/2013   Procedure: LEFT INDEX EXCISION MASS AND DEBRIDMENT DISTAL INTERPHALANGEAL JOINT;  Surgeon: Tami Ribas, MD;  Location: [facility];  Service: Orthopedics;  Laterality: Left;   PERIPHERAL VASCULAR BALLOON ANGIOPLASTY Left 08/30/2020   Procedure: PERIPHERAL VASCULAR BALLOON ANGIOPLASTY;  Surgeon: Nada Libman, MD;  Location: [facility];  Service: Cardiovascular;  Laterality: Left;  Peroneal   Social History:  reports that he quit smoking about 2 years ago. His smoking use included cigars. He has never used smokeless tobacco. He reports current alcohol use. He reports that he does not use drugs. Family History:  Family History  Problem Relation Age of Onset   Lymphoma Father    Cancer Paternal Grandmother        Colon Cancer   Colon cancer Paternal Grandmother    Colon polyps Neg Hx    Esophageal cancer Neg Hx    Rectal cancer Neg Hx    Stomach cancer Neg Hx    Diabetes Mellitus I Neg Hx    Pancreatic cancer Neg Hx      HOME MEDICATIONS: Allergies as of 06/08/2022       Reactions   Wool Alcohol [lanolin] Hives, Itching   Wool fabric        Medication List        Accurate as of June 08, 2022  9:05 AM. If you have any questions, ask your nurse or doctor.          acetaminophen 500 MG tablet Commonly known as: TYLENOL Take 500 mg by mouth as needed for mild pain or moderate pain.   apixaban 5 MG Tabs tablet Commonly known as:  Eliquis Take 1 tablet (5 mg total) by mouth 2 (two) times daily.   aspirin EC 81 MG tablet Take 1 tablet (81 mg total) by mouth daily. What changed: when to take this   atorvastatin 40 MG tablet Commonly known as: LIPITOR TAKE 1 TABLET BY MOUTH EVERY DAY   BD Pen Needle Nano 2nd Gen 32G X 4  MM Misc Generic drug: Insulin Pen Needle USE FOR INSULIN   Dexcom G6 Sensor Misc 1 Device by Does not apply route See admin instructions. Change every 10 days   Dexcom G6 Transmitter Misc Use as instructed to check blood sugar   Disposable Brief Large Misc 1 each by Does not apply route as needed.   furosemide 40 MG tablet Commonly known as: LASIX TAKE 1 TABLET BY MOUTH EVERY DAY   losartan 50 MG tablet Commonly known as: COZAAR Take 1 tablet (50 mg total) by mouth daily.   mirtazapine 15 MG tablet Commonly known as: REMERON TAKE 1 TABLET BY MOUTH EVERYDAY AT BEDTIME   multivitamin with minerals tablet Take 1 tablet by mouth daily.   mupirocin ointment 2 % Commonly known as: BACTROBAN Apply 1 Application topically daily.   NovoLOG FlexPen 100 UNIT/ML FlexPen Generic drug: insulin aspart Inject 4 Units into the skin 2 (two) times daily. When pump is not attached. What changed: Another medication with the same name was changed. Make sure you understand how and when to take each.   NovoLOG 100 UNIT/ML injection Generic drug: insulin aspart INJECT 50 UNITS INTO THE SKIN SEE ADMIN INSTRUCTIONS. PT LOADS PUMP WITH 50 UNITS EVERY 3 DAYS What changed: See the new instructions.   Omnipod 5 G6 Intro (Gen 5) Kit 1 Device by Does not apply route every 3 (three) days.   Omnipod 5 G6 Pods (Gen 5) Misc APPLY EVERY 3 (THREE) DAYS.   PARoxetine 20 MG tablet Commonly known as: PAXIL TAKE 1 TABLET BY MOUTH EVERY DAY What changed: when to take this   spironolactone 25 MG tablet Commonly known as: ALDACTONE TAKE 1 TABLET BY MOUTH EVERY DAY         OBJECTIVE:   Vital Signs: BP  (!) 140/82 (BP Location: Right Arm, Patient Position: Sitting, Cuff Size: Normal)   Pulse (!) 57   Ht 6\' 2"  (1.88 m)   Wt 165 lb 9.6 oz (75.1 kg)   BMI 21.26 kg/m   Wt Readings from Last 3 Encounters:  06/08/22 165 lb 9.6 oz (75.1 kg)  06/06/22 165 lb (74.8 kg)  02/27/22 169 lb (76.7 kg)     Exam: General: Pt appears well and is in NAD  Lungs: Clear with good BS bilat   Heart: RRR   Neuro: MS is good with appropriate affect, pt is alert and Ox3   Dm Foot Exam 06/05/2022 per podiatry      DATA REVIEWED:  Lab Results  Component Value Date   HGBA1C 7.8 (H) 10/23/2021   HGBA1C 9.7 (A) 08/18/2021   HGBA1C 9.2 (A) 04/20/2021    Latest Reference Range & Units 01/06/22 01:01  Sodium 135 - 145 mmol/L 133 (L)  Potassium 3.5 - 5.1 mmol/L 3.5  Chloride 98 - 111 mmol/L 98  CO2 22 - 32 mmol/L 30  Glucose 70 - 99 mg/dL 161 (H)  BUN 8 - 23 mg/dL 17  Creatinine 0.96 - 0.45 mg/dL 4.09  Calcium 8.9 - 81.1 mg/dL 7.9 (L)  Anion gap 5 - 15  5  GFR, Estimated >60 mL/min >60  (L): Data is abnormally low (H): Data is abnormally high   ASSESSMENT / PLAN / RECOMMENDATIONS:   1) Type 2 Diabetes Mellitus, poorly controlled, With retinopathic , neuropathic, retinopathic and macrovascular  complications - Most recent A1c of 11.2 %. Goal A1c < 7.0 %.    -Patient has been noted worsening glycemic control, A1c increased from 7.8% to 11.2% -He  is having difficulty managing his pump, he does not bolus with each meal, he is in the manual mode.  I have asked the patient to bolus with each meal, but he states that sometimes he does not feel that he needs it, this is concerning given that his CGM download has been averaging 338 Mg/DL -Dexcom downloaded separately, last download was on May 31 -I did offer to refer him to our CDE for retraining, patient declines at this time -I have increased his basal rate as well as adjusted his sensitivity factor -Patient was advised to enter #5 grams of carbohydrates  with each meal from now 1  MEDICATIONS: NovoLog     Pump   Omnipod Settings   Insulin type   Novolog    Basal rate       0000 0.85              I:C ratio       0000 1:1    Enter # 5 g              Sensitivity       0000  55      Goal       0000  120            EDUCATION / INSTRUCTIONS: BG monitoring instructions: Patient is instructed to check his blood sugars before times a day, before each meal and bedtime. Call [facility] if: BG persistently < 70  I reviewed the Rule of 15 for the treatment of hypoglycemia in detail with the patient. Literature supplied.     2) Diabetic complications:  Eye: Does  have known diabetic retinopathy.  Neuro/ Feet: Does have known diabetic peripheral neuropathy .  Renal: Patient does not have known baseline CKD. He   is  on an ACEI/ARB at present.    Follow-up in 3 months   Signed electronically by: Lyndle Herrlich, MD  [facility]  [facility] [address] [place], [place] [address] Phone: [phone] FAX: [phone]   CC: [name], [name], PA-C [address] [place] [address] Phone: [phone]  Fax: [phone]  Return to Endocrinology clinic as below: Future Appointments  Date Time Provider Department Center  06/22/2022  7:45 AM Rennis Chris, MD [facility] None  07/03/2022  8:00 AM Duanne Guess, MD [facility]  08/21/2022  8:20 AM O'Neal, Ronnald Ramp, MD [facility] None  09/05/2022  1:45 PM Helane Gunther, DPM [facility]

## 2022-06-09 DIAGNOSIS — S91302D Unspecified open wound, left foot, subsequent encounter: Secondary | ICD-10-CM | POA: Diagnosis not present

## 2022-06-11 ENCOUNTER — Telehealth: Payer: Self-pay | Admitting: Physician Assistant

## 2022-06-11 ENCOUNTER — Encounter: Payer: Self-pay | Admitting: Internal Medicine

## 2022-06-11 DIAGNOSIS — S91302D Unspecified open wound, left foot, subsequent encounter: Secondary | ICD-10-CM | POA: Diagnosis not present

## 2022-06-11 NOTE — Telephone Encounter (Signed)
[name] [name] with [facility] states pt's wound on left heel is worse. Pt is needing Home Health. Was given Keflex for 7 days.  Home Health orders are pending.

## 2022-06-12 ENCOUNTER — Telehealth: Payer: Self-pay

## 2022-06-12 DIAGNOSIS — S91302D Unspecified open wound, left foot, subsequent encounter: Secondary | ICD-10-CM | POA: Diagnosis not present

## 2022-06-12 NOTE — Telephone Encounter (Signed)
Called son [name] and advised Alyssa's recommendations. Front Desk [place]) scheduling patient for Thursday morning slot 8:30am for wound visit. Needing update and Home Health in to help patient ASAP.

## 2022-06-12 NOTE — Telephone Encounter (Signed)
Please advise if you would like me to return call with verbal orders

## 2022-06-12 NOTE — Telephone Encounter (Signed)
Was unable to reach [facility] staff and only reached message center. Pt has two orders in for wound care and home health. Spoke with patient son [name] [name] and we were working on it to get him scheduled. Please advise next steps or anyway to get patient scheduled something for wound care/ home health faster?

## 2022-06-12 NOTE — Telephone Encounter (Signed)
Wife is calling back and would like to know where the process is with wound care. She states wound has a foul smell and she thinks its infected. Please advise.

## 2022-06-12 NOTE — Telephone Encounter (Signed)
Called NP Lonzo Cloud back from [place]. LVM to return my call for update and further information on Home Health referral and wound care.

## 2022-06-13 ENCOUNTER — Telehealth: Payer: Self-pay | Admitting: Podiatry

## 2022-06-13 DIAGNOSIS — Z87891 Personal history of nicotine dependence: Secondary | ICD-10-CM | POA: Diagnosis not present

## 2022-06-13 DIAGNOSIS — Z7982 Long term (current) use of aspirin: Secondary | ICD-10-CM | POA: Diagnosis not present

## 2022-06-13 DIAGNOSIS — E11621 Type 2 diabetes mellitus with foot ulcer: Secondary | ICD-10-CM | POA: Diagnosis not present

## 2022-06-13 DIAGNOSIS — I2510 Atherosclerotic heart disease of native coronary artery without angina pectoris: Secondary | ICD-10-CM | POA: Diagnosis not present

## 2022-06-13 DIAGNOSIS — Z7901 Long term (current) use of anticoagulants: Secondary | ICD-10-CM | POA: Diagnosis not present

## 2022-06-13 DIAGNOSIS — Z85038 Personal history of other malignant neoplasm of large intestine: Secondary | ICD-10-CM | POA: Diagnosis not present

## 2022-06-13 DIAGNOSIS — F32A Depression, unspecified: Secondary | ICD-10-CM | POA: Diagnosis not present

## 2022-06-13 DIAGNOSIS — F419 Anxiety disorder, unspecified: Secondary | ICD-10-CM | POA: Diagnosis not present

## 2022-06-13 DIAGNOSIS — E1151 Type 2 diabetes mellitus with diabetic peripheral angiopathy without gangrene: Secondary | ICD-10-CM | POA: Diagnosis not present

## 2022-06-13 DIAGNOSIS — I480 Paroxysmal atrial fibrillation: Secondary | ICD-10-CM | POA: Diagnosis not present

## 2022-06-13 DIAGNOSIS — I509 Heart failure, unspecified: Secondary | ICD-10-CM | POA: Diagnosis not present

## 2022-06-13 DIAGNOSIS — M1990 Unspecified osteoarthritis, unspecified site: Secondary | ICD-10-CM | POA: Diagnosis not present

## 2022-06-13 DIAGNOSIS — L97422 Non-pressure chronic ulcer of left heel and midfoot with fat layer exposed: Secondary | ICD-10-CM | POA: Diagnosis not present

## 2022-06-13 DIAGNOSIS — L89152 Pressure ulcer of sacral region, stage 2: Secondary | ICD-10-CM | POA: Diagnosis not present

## 2022-06-13 DIAGNOSIS — I110 Hypertensive heart disease with heart failure: Secondary | ICD-10-CM | POA: Diagnosis not present

## 2022-06-13 DIAGNOSIS — E11319 Type 2 diabetes mellitus with unspecified diabetic retinopathy without macular edema: Secondary | ICD-10-CM | POA: Diagnosis not present

## 2022-06-13 DIAGNOSIS — Z792 Long term (current) use of antibiotics: Secondary | ICD-10-CM | POA: Diagnosis not present

## 2022-06-13 DIAGNOSIS — E785 Hyperlipidemia, unspecified: Secondary | ICD-10-CM | POA: Diagnosis not present

## 2022-06-13 DIAGNOSIS — E1165 Type 2 diabetes mellitus with hyperglycemia: Secondary | ICD-10-CM | POA: Diagnosis not present

## 2022-06-13 DIAGNOSIS — Z794 Long term (current) use of insulin: Secondary | ICD-10-CM | POA: Diagnosis not present

## 2022-06-13 NOTE — Telephone Encounter (Signed)
Veronica called for update on Home health referral. Per Misty Stanley, I informed caller that patient has been referred to [facility] and provided contact information to her. Caller verbalized understanding.

## 2022-06-13 NOTE — Telephone Encounter (Signed)
Received voicemail from Lonzo Cloud the np from [facility] stating pt was referred to the wound center however the appt is not until 7.2.2024. Pcp tried to get home health set up for dressing changes and no agency would take his insurance. So pt has had no woundcare follow up. Can we see if we can get the woundcare appt to be sooner. She requested we call the brother [name] at [phone] to discuss further.

## 2022-06-13 NOTE — Telephone Encounter (Signed)
Please see note as FYI

## 2022-06-14 ENCOUNTER — Ambulatory Visit: Payer: PPO | Admitting: Physician Assistant

## 2022-06-14 NOTE — Progress Notes (Signed)
[facility] - Clinic Note  06/22/2022     CHIEF COMPLAINT Patient presents for Retina Follow Up  HISTORY OF PRESENT ILLNESS: [name] [name] is a 67 y.o. male who presents to the clinic today for:   HPI     Retina Follow Up   Patient presents with  Diabetic Retinopathy.  In both eyes.  Severity is moderate.  Duration of 4 weeks.  Since onset it is stable.  I, the attending physician,  performed the HPI with the patient and updated documentation appropriately.        Comments   Patient states vision the same OU. BS was 188 this am. Last A1c was 8.1, checked in the past two weeks.       Last edited by Rennis Chris, MD on 06/22/2022 12:33 PM.    Pt states vision is not "bad", but not "perfect" either   Referring physician: [name], [name], PA-C [address] [place],  [place] [address]  HISTORICAL INFORMATION:   Selected notes from the MEDICAL RECORD NUMBER Referred by Dr. Mateo Flow for PDR OU   CURRENT MEDICATIONS: No current outpatient medications on file. (Ophthalmic Drugs)   No current facility-administered medications for this visit. (Ophthalmic Drugs)   Current Outpatient Medications (Other)  Medication Sig   acetaminophen (TYLENOL) 500 MG tablet Take 500 mg by mouth as needed for mild pain or moderate pain.   apixaban (ELIQUIS) 5 MG TABS tablet Take 1 tablet (5 mg total) by mouth 2 (two) times daily.   aspirin EC 81 MG EC tablet Take 1 tablet (81 mg total) by mouth daily. (Patient taking differently: Take 81 mg by mouth in the morning.)   atorvastatin (LIPITOR) 40 MG tablet TAKE 1 TABLET BY MOUTH EVERY DAY   BD PEN NEEDLE NANO 2ND GEN 32G X 4 MM MISC USE FOR INSULIN   Continuous Blood Gluc Sensor (DEXCOM G6 SENSOR) MISC 1 Device by Does not apply route See admin instructions. Change every 10 days   Continuous Blood Gluc Transmit (DEXCOM G6 TRANSMITTER) MISC Use as instructed to check blood sugar   furosemide (LASIX) 40 MG  tablet TAKE 1 TABLET BY MOUTH EVERY DAY   Incontinence Supply Disposable (DISPOSABLE BRIEF LARGE) MISC 1 each by Does not apply route as needed.   insulin aspart (NOVOLOG) 100 UNIT/ML injection Max daily 50 units   Insulin Disposable Pump (OMNIPOD 5 G6 INTRO, GEN 5,) KIT 1 Device by Does not apply route every 3 (three) days.   Insulin Disposable Pump (OMNIPOD 5 G6 PODS, GEN 5,) MISC APPLY EVERY 3 (THREE) DAYS.   mirtazapine (REMERON) 15 MG tablet TAKE 1 TABLET BY MOUTH EVERYDAY AT BEDTIME   Multiple Vitamins-Minerals (MULTIVITAMIN WITH MINERALS) tablet Take 1 tablet by mouth daily.   mupirocin ointment (BACTROBAN) 2 % Apply 1 Application topically daily.   PARoxetine (PAXIL) 20 MG tablet TAKE 1 TABLET BY MOUTH EVERY DAY (Patient taking differently: Take 20 mg by mouth in the morning.)   spironolactone (ALDACTONE) 25 MG tablet TAKE 1 TABLET BY MOUTH EVERY DAY   losartan (COZAAR) 50 MG tablet Take 1 tablet (50 mg total) by mouth daily.   No current facility-administered medications for this visit. (Other)   REVIEW OF SYSTEMS: ROS   Positive for: Neurological, Skin, Musculoskeletal, Endocrine, Cardiovascular, Eyes Negative for: Constitutional, Gastrointestinal, Genitourinary, HENT, Respiratory, Psychiatric, Allergic/Imm, Heme/Lymph Last edited by Annalee Genta D, COT on 06/22/2022  7:47 AM.     ALLERGIES Allergies  Allergen  Reactions   Wool Alcohol [Lanolin] Hives and Itching    Hydrographic surveyor   PAST MEDICAL HISTORY Past Medical History:  Diagnosis Date   Anxiety    Arthritis    Cataract    CHF (congestive heart failure) (HCC)    Constipation    pt states goes EOD- hard stools    Coronary artery disease    Depression    Diabetes mellitus without complication (HCC)    Diabetic retinopathy (HCC)    DKA (diabetic ketoacidoses)    Hyperlipidemia    Hypertension    off meds   Hypertensive retinopathy    Postherpetic neuralgia 08/11/2021   Speculative diagnosis   Psoriasis     Tubular adenoma of colon 03/2019   Past Surgical History:  Procedure Laterality Date   ABDOMINAL AORTOGRAM W/LOWER EXTREMITY N/A 08/30/2020   Procedure: ABDOMINAL AORTOGRAM W/LOWER EXTREMITY;  Surgeon: Nada Libman, MD;  Location: [facility];  Service: Cardiovascular;  Laterality: N/A;   ABDOMINAL AORTOGRAM W/LOWER EXTREMITY N/A 11/15/2020   Procedure: ABDOMINAL AORTOGRAM W/LOWER EXTREMITY;  Surgeon: Nada Libman, MD;  Location: [facility];  Service: Cardiovascular;  Laterality: N/A;   ANKLE SURGERY     right   CARDIAC CATHETERIZATION  09/25/2001   COLONOSCOPY     ~10 yr ago- normal  per pt    CORONARY ARTERY BYPASS GRAFT  09/30/2001   CABG x 4   IR THORACENTESIS ASP PLEURAL SPACE W/IMG GUIDE  05/19/2020   LAPAROSCOPIC INGUINAL HERNIA REPAIR Bilateral 02/09/2010   MASS EXCISION Left 03/12/2013   Procedure: LEFT INDEX EXCISION MASS AND DEBRIDMENT DISTAL INTERPHALANGEAL JOINT;  Surgeon: Tami Ribas, MD;  Location: [facility];  Service: Orthopedics;  Laterality: Left;   PERIPHERAL VASCULAR BALLOON ANGIOPLASTY Left 08/30/2020   Procedure: PERIPHERAL VASCULAR BALLOON ANGIOPLASTY;  Surgeon: Nada Libman, MD;  Location: [facility];  Service: Cardiovascular;  Laterality: Left;  Peroneal   FAMILY HISTORY Family History  Problem Relation Age of Onset   Lymphoma Father    Cancer Paternal Grandmother        Colon Cancer   Colon cancer Paternal Grandmother    Colon polyps Neg Hx    Esophageal cancer Neg Hx    Rectal cancer Neg Hx    Stomach cancer Neg Hx    Diabetes Mellitus I Neg Hx    Pancreatic cancer Neg Hx    SOCIAL HISTORY Social History   Tobacco Use   Smoking status: Former    Types: Cigars    Quit date: 2022    Years since quitting: 2.4   Smokeless tobacco: Never  Vaping Use   Vaping Use: Never used  Substance Use Topics   Alcohol use: Yes    Comment: about once a year   Drug use: No       OPHTHALMIC EXAM: Base Eye Exam      Visual Acuity (Snellen - Linear)       Right Left   Dist [place] 20/80 20/800   Dist ph [place] 20/40 -1 NI         Tonometry (Tonopen, 7:58 AM)       Right Left   Pressure 15 15         Pupils       Dark Light Shape React APD   Right 4 3 Round Sluggish None   Left 4 3 Round Sluggish None         Visual Fields (Counting fingers)  Left Right     Full   Restrictions Partial outer superior nasal, inferior nasal deficiencies          Extraocular Movement       Right Left    Full, Ortho Full, Ortho         Neuro/Psych     Oriented x3: Yes   Mood/Affect: Normal         Dilation     Both eyes: 1.0% Mydriacyl, 2.5% Phenylephrine @ 7:58 AM           Slit Lamp and Fundus Exam     External Exam       Right Left   External Normal Normal         Slit Lamp Exam       Right Left   Lids/Lashes Dermatochalasis - upper lid, Meibomian gland dysfunction Dermatochalasis - upper lid, Meibomian gland dysfunction   Conjunctiva/Sclera White and quiet White and quiet   Cornea arcus, 1-2+ fine Punctate epithelial erosions, tear film debris arcus, 1-2+ fine Punctate epithelial erosions, tear film debris   Anterior Chamber deep and clear deep and clear   Iris Round and dilated, No NVI Round and dilated, No NVI   Lens 2-3+ Nuclear sclerosis with brunescence, 2+ Cortical cataract 2-3+ Nuclear sclerosis with brunescence, 2-3+ Cortical cataract, 1+ Posterior subcapsular cataract   Anterior Vitreous Vitreous syneresis, blood stained vitreous condensations -- improved / resolved Vitreous syneresis, diffuse VH - improved, interval improvement in white blood stained vitreous condensations, old white blood clots settled inferiorly, no red heme         Fundus Exam       Right Left   Disc mild Pallor, Sharp rim Compact, fibrosis, mild Pallor   C/D Ratio 0.2 0.2   Macula Flat, good foveal reflex, scatterd MA/DBH, punctate exudates, atrophic +traction, Blunted foveal  reflex, grossly flat, scattered MA / DBH, focal exudates temporal macula -- improved   Vessels +NVE just inside ST arcades - improved, +fibrosis, attenuated, Tortuous Severe attenuation, Tortuous, +NVD -- regressed to fibrosis; NVE along ST arcades - regressing, +fibrosis   Periphery Attached; scattered MA/DBH, good 360 PRP with room for fill in Hazy view, scattered fibrosis, grossly attached, scattered PRP visible, scattered MA/DBH, 360 PRP with room for fill in           IMAGING AND PROCEDURES  Imaging and Procedures for 06/22/2022  OCT, Retina - OU - Both Eyes       Right Eye Quality was borderline. Central Foveal Thickness: 240. Progression has improved. Findings include no SRF, abnormal foveal contour, intraretinal hyper-reflective material, intraretinal fluid, vitreous traction, preretinal fibrosis (Partial PVD, interval improvement in IRF / scattered cystic changes, persistent vitreous opacities).   Left Eye Quality was borderline. Central Foveal Thickness: 208. Progression has improved. Findings include no SRF, abnormal foveal contour, intraretinal hyper-reflective material, intraretinal fluid, vitreous traction, outer retinal atrophy (Persistent vitreous opacities, persistent vitreous traction greatest along proximal arcades and disc -- slightly improved, diffuse atrophy).   Notes *Images captured and stored on drive  Diagnosis / Impression:  PDR with DME OU OD: Partial PVD, interval improvement in IRF / scattered cystic changes, persistent vitreous opacities OS: Persistent vitreous opacities, persistent vitreous traction greatest along proximal arcades and disc -- slightly improved, diffuse atrophy  Clinical management:  See below  Abbreviations: NFP - Normal foveal profile. CME - cystoid macular edema. PED - pigment epithelial detachment. IRF - intraretinal fluid. SRF - subretinal fluid. EZ - ellipsoid zone. ERM -  epiretinal membrane. ORA - outer retinal atrophy. ORT - outer  retinal tubulation. SRHM - subretinal hyper-reflective material. IRHM - intraretinal hyper-reflective material      Intravitreal Injection, Pharmacologic Agent - OD - Right Eye       Time Out 06/22/2022. 8:51 AM. Confirmed correct patient, procedure, site, and patient consented.   Anesthesia Topical anesthesia was used. Anesthetic medications included Lidocaine 2%, Proparacaine 0.5%.   Procedure Preparation included 5% betadine to ocular surface, eyelid speculum. A supplied (32g) needle was used.   Injection: 1.25 mg Bevacizumab 1.25mg /0.56ml   Route: Intravitreal, Site: Right Eye   NDC: P3213405, Lot: [id number], Expiration date: 08/13/2022   Post-op Post injection exam found visual acuity of at least counting fingers. The patient tolerated the procedure well. There were no complications. The patient received written and verbal post procedure care education. Post injection medications were not given.      Intravitreal Injection, Pharmacologic Agent - OS - Left Eye       Time Out 06/22/2022. 8:51 AM. Confirmed correct patient, procedure, site, and patient consented.   Anesthesia Topical anesthesia was used. Anesthetic medications included Lidocaine 2%, Proparacaine 0.5%.   Procedure Preparation included 5% betadine to ocular surface, eyelid speculum. A (32 g) needle was used.   Injection: 1.25 mg Bevacizumab 1.25mg /0.59ml   Route: Intravitreal, Site: Left Eye   NDC: P3213405, Lot: [id number], Expiration date: 09/28/2022   Post-op Post injection exam found visual acuity of at least counting fingers. The patient tolerated the procedure well. There were no complications. The patient received written and verbal post procedure care education. Post injection medications were not given.            ASSESSMENT/PLAN:   ICD-10-CM   1. Proliferative diabetic retinopathy of both eyes with macular edema associated with type 2 diabetes mellitus (HCC)  E11.3513 OCT, Retina - OU -  Both Eyes    Intravitreal Injection, Pharmacologic Agent - OD - Right Eye    Intravitreal Injection, Pharmacologic Agent - OS - Left Eye    Bevacizumab (AVASTIN) SOLN 1.25 mg    Bevacizumab (AVASTIN) SOLN 1.25 mg    2. Current use of insulin (HCC)  Z79.4     3. Vitreous hemorrhage of left eye (HCC)  H43.12     4. Essential hypertension  I10     5. Hypertensive retinopathy of both eyes  H35.033 CANCELED: Fluorescein Angiography Optos (Transit OS)    6. Combined forms of age-related cataract of both eyes  H25.813      1-3. Severe proliferative diabetic retinopathy, both eyes  - pt is delayed to follow up from 4 weeks to 11 weeks (02.23.24 to 05.10.24)  - pt was lost to follow up from 11.10.23 to 02.23.24 -- cancelled sx x3  - last A1c 11.2 on 06.07.24, 7.8 on 10.23.23;  9.7 on 08.18.23; 9.2 on 4.20.23; A1c >15% on 10.11.22 - s/p PRP OS (11.10.22)  - s/p PRP OD (12.02.22)  - s/p IVA OS #1 (01.18.23) for VH, #2 (03.17.23), #3 (04.14.23), #4 (05.12.23), #5 (06.09.23), #6 (07.07.23), #7 (08.10.23), #8 (09.08.23), #9 (10.06.23) #10 (11.10.23), #11 (02.23.24), #12 (05.10.24)  - s/p IVA OD #1 (01.27.23) for macular edema/NVD, #2 (03.17.23), #3 (04.14.23), #4 (05.12.23), #5 (06.09.23), #6 (07.07.23), #7 (08.10.23), #8 (09.08.23), #9 (10.06.23), #10 (11.10.23), #11 (02.23.24), #12 (05.10.24) - pt reports improved BG control -- on insulin pump - exam shows extensive neovascularization, fibrosis and hemorrhage OU (OS >>> OD)  - improved VH OU - BCVA improve OU --  20/40 from 20/50 OD; 20/800 from CF OS -- limited by retinal ischemia - FA (11.09.22) shows areas of vascular non-perfusion OU, +NVD OU and +NVE OU - OCT shows OD: Partial PVD, interval improvement in IRF / scattered cystic changes, persistent vitreous opacities; OS: Persistent vitreous opacities, persistent vitreous traction greatest along proximal arcades and disc -- slightly improved, diffuse atrophy at 4 weeks - discussed findings,  severity of his disease, prognosis and likely need for extensive treatments -- PRP OU, multiple rounds of anti-VEGF therapy OU and possible surgery - recommend IVA OU #13 today, 06.21.24 for PDR and DME OU - pt wishes to proceed with injections - RBA of procedure discussed, questions answered - informed consent obtained and signed, 01.18.23 - see procedure note - f/u 4 weeks, DFE/OCT/FA (transit OS)  4,5. Hypertensive retinopathy OU - discussed importance of tight BP control - monitor   6. Mixed Cataract OU - The symptoms of cataract, surgical options, and treatments and risks were discussed with patient - discussed diagnosis and progression - likely visually significant - will refer back to [facility] for cat eval and potential surgery prior to PPV  Ophthalmic Meds Ordered this visit:  Meds ordered this encounter  Medications   Bevacizumab (AVASTIN) SOLN 1.25 mg   Bevacizumab (AVASTIN) SOLN 1.25 mg     Return in about 4 weeks (around 07/20/2022) for f/u PDR OU, DFE, OCT.  There are no Patient Instructions on file for this visit. This document serves as a record of services personally performed by Karie Chimera, MD, PhD. It was created on their behalf by Berlin Hun COT, an ophthalmic technician. The creation of this record is the provider's dictation and/or activities during the visit.    Electronically signed by: Berlin Hun COT 06.13.2024 12:33 PM  This document serves as a record of services personally performed by Karie Chimera, MD, PhD. It was created on their behalf by Glee Arvin. Manson Passey, OA an ophthalmic technician. The creation of this record is the provider's dictation and/or activities during the visit.    Electronically signed by: Glee Arvin. Kristopher Oppenheim 06.21.2024 12:33 PM  Karie Chimera, M.D., Ph.D. Diseases & Surgery of the Retina and Vitreous [facility] 06/22/2022   I have reviewed the above documentation for accuracy  and completeness, and I agree with the above. Karie Chimera, M.D., Ph.D. 06/22/22 12:36 PM   Abbreviations: M myopia (nearsighted); A astigmatism; H hyperopia (farsighted); P presbyopia; Mrx spectacle prescription;  CTL contact lenses; OD right eye; OS left eye; OU both eyes  XT exotropia; ET esotropia; PEK punctate epithelial keratitis; PEE punctate epithelial erosions; DES dry eye syndrome; MGD meibomian gland dysfunction; ATs artificial tears; PFAT's preservative free artificial tears; NSC nuclear sclerotic cataract; PSC posterior subcapsular cataract; ERM epi-retinal membrane; PVD posterior vitreous detachment; RD retinal detachment; DM diabetes mellitus; DR diabetic retinopathy; NPDR non-proliferative diabetic retinopathy; PDR proliferative diabetic retinopathy; CSME clinically significant macular edema; DME diabetic macular edema; dbh dot blot hemorrhages; CWS cotton wool spot; POAG primary open angle glaucoma; C/D cup-to-disc ratio; HVF humphrey visual field; GVF goldmann visual field; OCT optical coherence tomography; IOP intraocular pressure; BRVO Branch retinal vein occlusion; CRVO central retinal vein occlusion; CRAO central retinal artery occlusion; BRAO branch retinal artery occlusion; RT retinal tear; SB scleral buckle; PPV pars plana vitrectomy; VH Vitreous hemorrhage; PRP panretinal laser photocoagulation; IVK intravitreal kenalog; VMT vitreomacular traction; MH Macular hole;  NVD neovascularization of the disc; NVE neovascularization elsewhere; AREDS age  related eye disease study; ARMD age related macular degeneration; POAG primary open angle glaucoma; EBMD epithelial/anterior basement membrane dystrophy; ACIOL anterior chamber intraocular lens; IOL intraocular lens; PCIOL posterior chamber intraocular lens; Phaco/IOL phacoemulsification with intraocular lens placement; PRK photorefractive keratectomy; LASIK laser assisted in situ keratomileusis; HTN hypertension; DM diabetes mellitus; COPD  chronic obstructive pulmonary disease

## 2022-06-18 DIAGNOSIS — S91302D Unspecified open wound, left foot, subsequent encounter: Secondary | ICD-10-CM | POA: Diagnosis not present

## 2022-06-21 ENCOUNTER — Other Ambulatory Visit: Payer: Self-pay | Admitting: Internal Medicine

## 2022-06-22 ENCOUNTER — Encounter (INDEPENDENT_AMBULATORY_CARE_PROVIDER_SITE_OTHER): Payer: Self-pay | Admitting: Ophthalmology

## 2022-06-22 ENCOUNTER — Ambulatory Visit (INDEPENDENT_AMBULATORY_CARE_PROVIDER_SITE_OTHER): Payer: PPO | Admitting: Ophthalmology

## 2022-06-22 DIAGNOSIS — H4312 Vitreous hemorrhage, left eye: Secondary | ICD-10-CM | POA: Diagnosis not present

## 2022-06-22 DIAGNOSIS — E113513 Type 2 diabetes mellitus with proliferative diabetic retinopathy with macular edema, bilateral: Secondary | ICD-10-CM

## 2022-06-22 DIAGNOSIS — H25813 Combined forms of age-related cataract, bilateral: Secondary | ICD-10-CM | POA: Diagnosis not present

## 2022-06-22 DIAGNOSIS — Z794 Long term (current) use of insulin: Secondary | ICD-10-CM

## 2022-06-22 DIAGNOSIS — H35033 Hypertensive retinopathy, bilateral: Secondary | ICD-10-CM

## 2022-06-22 DIAGNOSIS — I10 Essential (primary) hypertension: Secondary | ICD-10-CM

## 2022-06-22 MED ORDER — BEVACIZUMAB CHEMO INJECTION 1.25MG/0.05ML SYRINGE FOR KALEIDOSCOPE
1.2500 mg | INTRAVITREAL | Status: AC | PRN
Start: 2022-06-22 — End: 2022-06-22
  Administered 2022-06-22: 1.25 mg via INTRAVITREAL

## 2022-06-27 ENCOUNTER — Telehealth: Payer: Self-pay | Admitting: Physician Assistant

## 2022-06-27 NOTE — Telephone Encounter (Signed)
Patient dropped off document Home Health Certificate (Order ID [id number]), to be filled out by provider. Patient requested to send it back via Fax within 5-days. Document is located in providers tray at front office.Please advise

## 2022-07-02 ENCOUNTER — Telehealth: Payer: Self-pay | Admitting: Physician Assistant

## 2022-07-02 NOTE — Telephone Encounter (Signed)
..  Home Health Verbal Orders  Agency:  [facility]  Caller: Horald Pollen  [phone]  Requesting OT/ PT/ Skilled nursing/ Social Work/ Speech:  Skilled nursing  Reason for Request:  Wound care  Frequency:  2 x 5 weeks (Nursing & Wound Care)  HH needs F2F w/in last 30 days

## 2022-07-02 NOTE — Telephone Encounter (Signed)
Returned call to [name] [name] and provided verbal orders for wound care per PCP approval

## 2022-07-02 NOTE — Telephone Encounter (Signed)
Please advise 

## 2022-07-02 NOTE — Telephone Encounter (Signed)
Noted  

## 2022-07-02 NOTE — Telephone Encounter (Signed)
Please see home health request and advise

## 2022-07-03 ENCOUNTER — Ambulatory Visit (HOSPITAL_BASED_OUTPATIENT_CLINIC_OR_DEPARTMENT_OTHER): Payer: PPO | Admitting: General Surgery

## 2022-07-19 ENCOUNTER — Telehealth: Payer: Self-pay | Admitting: Physician Assistant

## 2022-07-19 NOTE — Telephone Encounter (Signed)
Home Health Verbal Orders  Agency:  [facility]  Caller: Investment banker, [employer] and title  Requesting OT/ PT/ Skilled nursing/ Social Work/ Speech:    Reason for Request:  Leg is progressing  - new red blister on left 4th toe - looks like a blood blister  Frequency:    HH needs F2F w/in last 30 days     [phone]

## 2022-07-19 NOTE — Telephone Encounter (Signed)
See note

## 2022-07-20 ENCOUNTER — Encounter (INDEPENDENT_AMBULATORY_CARE_PROVIDER_SITE_OTHER): Payer: PPO | Admitting: Ophthalmology

## 2022-07-20 DIAGNOSIS — H35033 Hypertensive retinopathy, bilateral: Secondary | ICD-10-CM

## 2022-07-20 DIAGNOSIS — H4312 Vitreous hemorrhage, left eye: Secondary | ICD-10-CM

## 2022-07-20 DIAGNOSIS — Z794 Long term (current) use of insulin: Secondary | ICD-10-CM

## 2022-07-20 DIAGNOSIS — I10 Essential (primary) hypertension: Secondary | ICD-10-CM

## 2022-07-20 DIAGNOSIS — E113513 Type 2 diabetes mellitus with proliferative diabetic retinopathy with macular edema, bilateral: Secondary | ICD-10-CM

## 2022-07-20 DIAGNOSIS — H25813 Combined forms of age-related cataract, bilateral: Secondary | ICD-10-CM

## 2022-07-20 NOTE — Telephone Encounter (Signed)
[name] [name] at [phone] VO given

## 2022-07-23 ENCOUNTER — Other Ambulatory Visit: Payer: Self-pay

## 2022-07-23 DIAGNOSIS — E1042 Type 1 diabetes mellitus with diabetic polyneuropathy: Secondary | ICD-10-CM

## 2022-07-23 MED ORDER — DEXCOM G6 TRANSMITTER MISC
1 refills | Status: DC
Start: 2022-07-23 — End: 2022-11-13

## 2022-07-23 NOTE — Telephone Encounter (Signed)
Patient son [name] will stop by and pick up sample of G6 for patient

## 2022-07-24 NOTE — Telephone Encounter (Signed)
Patient's son, [name], came in to office today and picked up sample of Dexcom G7 transmitter.

## 2022-07-27 ENCOUNTER — Telehealth: Payer: Self-pay | Admitting: Physician Assistant

## 2022-07-27 NOTE — Telephone Encounter (Signed)
..  Home Health Verbal Orders  Agency:  [facility]  Caller: Horald Pollen  [phone]   Requesting OT/ PT/ Skilled nursing/ Social Work/ Speech: Skilled nursing--Wound care   Reason for Request:  Small skin tear on sacrum, doesn't appear to be pressure ulcer  Frequency: Changes dressing twice a week until healed. Apply silver alignate, and apply foam dressing   HH needs F2F w/in last 30 days

## 2022-07-31 NOTE — Telephone Encounter (Signed)
Please review and advise.

## 2022-08-01 NOTE — Telephone Encounter (Signed)
Called and left a voice message for [name] [name] to give me a call back for the verbal order needed

## 2022-08-20 ENCOUNTER — Encounter (INDEPENDENT_AMBULATORY_CARE_PROVIDER_SITE_OTHER): Payer: PPO | Admitting: Ophthalmology

## 2022-08-20 ENCOUNTER — Encounter (INDEPENDENT_AMBULATORY_CARE_PROVIDER_SITE_OTHER): Payer: Self-pay

## 2022-08-20 DIAGNOSIS — H35033 Hypertensive retinopathy, bilateral: Secondary | ICD-10-CM

## 2022-08-20 DIAGNOSIS — I10 Essential (primary) hypertension: Secondary | ICD-10-CM

## 2022-08-20 DIAGNOSIS — H25813 Combined forms of age-related cataract, bilateral: Secondary | ICD-10-CM

## 2022-08-20 DIAGNOSIS — E113513 Type 2 diabetes mellitus with proliferative diabetic retinopathy with macular edema, bilateral: Secondary | ICD-10-CM

## 2022-08-20 DIAGNOSIS — H4312 Vitreous hemorrhage, left eye: Secondary | ICD-10-CM

## 2022-08-20 DIAGNOSIS — Z794 Long term (current) use of insulin: Secondary | ICD-10-CM

## 2022-08-21 ENCOUNTER — Ambulatory Visit: Payer: PPO | Admitting: Cardiovascular Disease

## 2022-08-30 DIAGNOSIS — H35033 Hypertensive retinopathy, bilateral: Secondary | ICD-10-CM | POA: Diagnosis not present

## 2022-08-30 DIAGNOSIS — H524 Presbyopia: Secondary | ICD-10-CM | POA: Diagnosis not present

## 2022-08-30 DIAGNOSIS — E113593 Type 2 diabetes mellitus with proliferative diabetic retinopathy without macular edema, bilateral: Secondary | ICD-10-CM | POA: Diagnosis not present

## 2022-08-30 DIAGNOSIS — H25812 Combined forms of age-related cataract, left eye: Secondary | ICD-10-CM | POA: Diagnosis not present

## 2022-08-30 DIAGNOSIS — H25813 Combined forms of age-related cataract, bilateral: Secondary | ICD-10-CM | POA: Diagnosis not present

## 2022-08-30 LAB — HM DIABETES EYE EXAM

## 2022-09-04 ENCOUNTER — Encounter: Payer: Self-pay | Admitting: Internal Medicine

## 2022-09-05 ENCOUNTER — Encounter: Payer: Self-pay | Admitting: Podiatry

## 2022-09-05 ENCOUNTER — Ambulatory Visit (INDEPENDENT_AMBULATORY_CARE_PROVIDER_SITE_OTHER): Payer: PPO | Admitting: Podiatry

## 2022-09-05 ENCOUNTER — Other Ambulatory Visit: Payer: Self-pay | Admitting: Internal Medicine

## 2022-09-05 DIAGNOSIS — M79675 Pain in left toe(s): Secondary | ICD-10-CM

## 2022-09-05 DIAGNOSIS — M79674 Pain in right toe(s): Secondary | ICD-10-CM

## 2022-09-05 DIAGNOSIS — B351 Tinea unguium: Secondary | ICD-10-CM

## 2022-09-05 DIAGNOSIS — E1165 Type 2 diabetes mellitus with hyperglycemia: Secondary | ICD-10-CM

## 2022-09-05 DIAGNOSIS — E1142 Type 2 diabetes mellitus with diabetic polyneuropathy: Secondary | ICD-10-CM | POA: Diagnosis not present

## 2022-09-05 DIAGNOSIS — I739 Peripheral vascular disease, unspecified: Secondary | ICD-10-CM

## 2022-09-05 NOTE — Progress Notes (Signed)
This patient returns to my office for at risk foot care.  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  This patient is unable to cut nails himself since the patient cannot reach his nails.These nails are painful walking and wearing shoes.  He presents to the office with his wife..This patient presents for at risk foot care today.  General Appearance  Alert, conversant and in no acute stress.  Vascular  Dorsalis pedis and posterior tibial  pulses are palpable  bilaterally.  Capillary return is within normal limits  bilaterally. Temperature is within normal limits  bilaterally.  Neurologic  Senn-Weinstein monofilament wire test within normal limits  bilaterally. Muscle power within normal limits bilaterally.  Nails Thick disfigured discolored nails with subungual debris  from hallux to fifth toes bilaterally. No evidence of bacterial infection or drainage bilaterally.  Orthopedic  No limitations of motion  feet .  No crepitus or effusions noted.  No bony pathology or digital deformities noted.  Skin  normotropic skin with no porokeratosis noted bilaterally.  No signs of infections or ulcers noted.   Pre-ulcerous callus sub 5th right foot. Hemorrhagic crust fourth toe left foot.  Onychomycosis  Pain in right toes  Pain in left toes    Consent was obtained for treatment procedures.   Mechanical debridement of nails 1-5  bilaterally performed with a nail nipper.  Filed with dremel without incident.    Return office visit   3  months                   Told patient to return for periodic foot care and evaluation due to potential at risk complications.   Helane Gunther DPM

## 2022-09-12 ENCOUNTER — Telehealth: Payer: Self-pay | Admitting: Physician Assistant

## 2022-09-12 DIAGNOSIS — F419 Anxiety disorder, unspecified: Secondary | ICD-10-CM | POA: Diagnosis not present

## 2022-09-12 DIAGNOSIS — I480 Paroxysmal atrial fibrillation: Secondary | ICD-10-CM | POA: Diagnosis not present

## 2022-09-12 DIAGNOSIS — I110 Hypertensive heart disease with heart failure: Secondary | ICD-10-CM | POA: Diagnosis not present

## 2022-09-12 DIAGNOSIS — Z794 Long term (current) use of insulin: Secondary | ICD-10-CM | POA: Diagnosis not present

## 2022-09-12 DIAGNOSIS — M1990 Unspecified osteoarthritis, unspecified site: Secondary | ICD-10-CM | POA: Diagnosis not present

## 2022-09-12 DIAGNOSIS — I2510 Atherosclerotic heart disease of native coronary artery without angina pectoris: Secondary | ICD-10-CM | POA: Diagnosis not present

## 2022-09-12 DIAGNOSIS — L89152 Pressure ulcer of sacral region, stage 2: Secondary | ICD-10-CM | POA: Diagnosis not present

## 2022-09-12 DIAGNOSIS — E1165 Type 2 diabetes mellitus with hyperglycemia: Secondary | ICD-10-CM | POA: Diagnosis not present

## 2022-09-12 DIAGNOSIS — F32A Depression, unspecified: Secondary | ICD-10-CM | POA: Diagnosis not present

## 2022-09-12 DIAGNOSIS — E11319 Type 2 diabetes mellitus with unspecified diabetic retinopathy without macular edema: Secondary | ICD-10-CM | POA: Diagnosis not present

## 2022-09-12 DIAGNOSIS — E1151 Type 2 diabetes mellitus with diabetic peripheral angiopathy without gangrene: Secondary | ICD-10-CM | POA: Diagnosis not present

## 2022-09-12 DIAGNOSIS — I509 Heart failure, unspecified: Secondary | ICD-10-CM | POA: Diagnosis not present

## 2022-09-12 NOTE — Telephone Encounter (Signed)
Patient dropped off document Home Health Certificate (Order ID [id number]), to be filled out by provider. Patient requested to send it back via Fax within 5-days. Document is located in providers tray at front office.Please advise

## 2022-09-13 NOTE — Telephone Encounter (Signed)
Faxed for patient

## 2022-09-13 NOTE — Telephone Encounter (Signed)
Paperwork received and given to provider for review and signatures.

## 2022-09-16 ENCOUNTER — Other Ambulatory Visit: Payer: Self-pay | Admitting: Physician Assistant

## 2022-09-16 DIAGNOSIS — E785 Hyperlipidemia, unspecified: Secondary | ICD-10-CM

## 2022-09-20 ENCOUNTER — Ambulatory Visit (INDEPENDENT_AMBULATORY_CARE_PROVIDER_SITE_OTHER): Payer: PPO | Admitting: Physician Assistant

## 2022-09-20 ENCOUNTER — Encounter: Payer: Self-pay | Admitting: Physician Assistant

## 2022-09-20 VITALS — BP 140/80 | HR 67 | Temp 97.8°F | Wt 176.6 lb

## 2022-09-20 DIAGNOSIS — R159 Full incontinence of feces: Secondary | ICD-10-CM

## 2022-09-20 DIAGNOSIS — R933 Abnormal findings on diagnostic imaging of other parts of digestive tract: Secondary | ICD-10-CM | POA: Diagnosis not present

## 2022-09-20 MED ORDER — LOPERAMIDE HCL 2 MG PO TABS
2.0000 mg | ORAL_TABLET | Freq: Four times a day (QID) | ORAL | 0 refills | Status: DC | PRN
Start: 2022-09-20 — End: 2022-11-21

## 2022-09-20 NOTE — Patient Instructions (Signed)
Please schedule for fasting labs tomorrow or early next week  Referral to GI for colonoscopy repeat and to discuss stool issues.  Trial Imodium to help with symptom relief.  Recommend follow up in our office in 4-8 weeks with MD.

## 2022-09-20 NOTE — Progress Notes (Signed)
Subjective:    Patient ID: Yvette Evelyn, male    DOB: 09-22-1955, 67 y.o.   MRN: [id number]  Chief Complaint  Patient presents with   Diarrhea    He states he's been having Incontinence for a few months now with his bowels.    HPI Patient is in today for "uncontrolled diarrhea." Hasn't had regular stool in years. 2-3 episodes of incontinence daily. Never bloody, but watery.  No new meds. No on metformin.   No stomach pain. Wt is stable. No fever or chills.  Last colonoscopy 03/30/19 - recommended to return in 2 years because of polyps.   Two cataract surgeries coming up.     Past Medical History:  Diagnosis Date   Anxiety    Arthritis    Cataract    CHF (congestive heart failure) (HCC)    Constipation    pt states goes EOD- hard stools    Coronary artery disease    Depression    Diabetes mellitus without complication (HCC)    Diabetic retinopathy (HCC)    DKA (diabetic ketoacidoses)    Hyperlipidemia    Hypertension    off meds   Hypertensive retinopathy    Postherpetic neuralgia 08/11/2021   Speculative diagnosis   Psoriasis    Tubular adenoma of colon 03/2019    Past Surgical History:  Procedure Laterality Date   ABDOMINAL AORTOGRAM W/LOWER EXTREMITY N/A 08/30/2020   Procedure: ABDOMINAL AORTOGRAM W/LOWER EXTREMITY;  Surgeon: Nada Libman, MD;  [facility];  Service: Cardiovascular;  Laterality: N/A;   ABDOMINAL AORTOGRAM W/LOWER EXTREMITY N/A 11/15/2020   Procedure: ABDOMINAL AORTOGRAM W/LOWER EXTREMITY;  Surgeon: Nada Libman, MD;  [facility]: [facility];  Service: Cardiovascular;  Laterality: N/A;   ANKLE SURGERY     right   CARDIAC CATHETERIZATION  09/25/2001   COLONOSCOPY     ~10 yr ago- normal  per pt    CORONARY ARTERY BYPASS GRAFT  09/30/2001   CABG x 4   IR THORACENTESIS ASP PLEURAL SPACE W/IMG GUIDE  05/19/2020   LAPAROSCOPIC INGUINAL HERNIA REPAIR Bilateral 02/09/2010   MASS EXCISION Left 03/12/2013    Procedure: LEFT INDEX EXCISION MASS AND DEBRIDMENT DISTAL INTERPHALANGEAL JOINT;  Surgeon: Tami Ribas, MD;  Location:  SURGERY CENTER;  Service: Orthopedics;  Laterality: Left;   PERIPHERAL VASCULAR BALLOON ANGIOPLASTY Left 08/30/2020   Procedure: PERIPHERAL VASCULAR BALLOON ANGIOPLASTY;  Surgeon: Nada Libman, MD;  [facility];  Service: Cardiovascular;  Laterality: Left;  Peroneal    Family History  Problem Relation Age of Onset   Lymphoma Father    Cancer Paternal Grandmother        Colon Cancer   Colon cancer Paternal Grandmother    Colon polyps Neg Hx    Esophageal cancer Neg Hx    Rectal cancer Neg Hx    Stomach cancer Neg Hx    Diabetes Mellitus I Neg Hx    Pancreatic cancer Neg Hx     Social History   Tobacco Use   Smoking status: Former    Types: Cigars    Quit date: 2022    Years since quitting: 2.7   Smokeless tobacco: Never  Vaping Use   Vaping status: Never Used  Substance Use Topics   Alcohol use: Yes    Comment: about once a year   Drug use: No     Allergies  Allergen Reactions   Wool Alcohol [Lanolin] Hives and Itching  Wool fabric    Review of Systems NEGATIVE UNLESS OTHERWISE INDICATED IN HPI      Objective:     BP (!) 140/80   Pulse 67   Temp 97.8 F (36.6 C) (Oral)   Wt 176 lb 9.6 oz (80.1 kg)   SpO2 97%   BMI 22.67 kg/m   Wt Readings from Last 3 Encounters:  09/20/22 176 lb 9.6 oz (80.1 kg)  06/08/22 165 lb 9.6 oz (75.1 kg)  06/06/22 165 lb (74.8 kg)    BP Readings from Last 3 Encounters:  09/20/22 (!) 140/80  06/08/22 (!) 140/82  06/06/22 130/86     Physical Exam Vitals and nursing note reviewed.  Constitutional:      General: He is not in acute distress.    Appearance: Normal appearance. He is not ill-appearing.     Comments: Wearing winter gloves  Cardiovascular:     Rate and Rhythm: Normal rate and regular rhythm.  Pulmonary:     Effort: Pulmonary effort is normal.     Breath  sounds: Normal breath sounds.  Abdominal:     General: Abdomen is flat. Bowel sounds are normal. There is no distension.     Palpations: Abdomen is soft. There is no mass.     Tenderness: There is no abdominal tenderness. There is no right CVA tenderness, left CVA tenderness or guarding.  Neurological:     Mental Status: He is alert and oriented to person, place, and time.  Psychiatric:        Mood and Affect: Mood normal.        Assessment & Plan:  Incontinence of feces, unspecified fecal incontinence type -     Loperamide HCl; Take 1 tablet (2 mg total) by mouth 4 (four) times daily as needed for diarrhea or loose stools.  Dispense: 40 tablet; Refill: 0 -     Ambulatory referral to Gastroenterology -     CBC with Differential/Platelet; Future -     Comprehensive metabolic panel; Future -     IBC + Ferritin; Future  Abnormal colonoscopy -     Ambulatory referral to Gastroenterology   Complex 67 yo male with extensive medical history with complaints of incontinence of stool. Overdue for colonoscopy. Would also advise labs be checked - he declines today due to not having anything to eat / little to drink and he faints easily. He will schedule for labs in the next week. Plan on urgent referral to GI for colonoscopy repeat and to discuss stool issues. Trial imodium at this time. Low threshold for ER if any blood in stool, excessive incontinence, abdominal pain, fever, fainting, etc. Pt agreeable.      Return in about 4 weeks (around 10/18/2022) for recheck/follow-up - Recommend TOC to see MD - Dr. Jon Billings advised .     Katty Fretwell M Rocky Gladden, PA-C

## 2022-09-25 ENCOUNTER — Telehealth: Payer: Self-pay | Admitting: Gastroenterology

## 2022-09-25 NOTE — Telephone Encounter (Signed)
Urgent referral in [place] for uncontrolled diarrhea.  Needs colonoscopy on blood thinner.  Last saw Dr. Russella Dar 04/2021.  Please review and advise scheduling

## 2022-09-26 NOTE — Telephone Encounter (Signed)
FYI Alyssa the pt son called and states that the pt does not want the appt for tomorrow. He did agree to reschedule for December but he is not sure the pt will keep that appt either.

## 2022-09-26 NOTE — Telephone Encounter (Signed)
[name] [name] I have the pt on for tomorrow at 11 am with [facility].  Can you please call and let the pt know. Thank you

## 2022-09-26 NOTE — Telephone Encounter (Signed)
The pt son called and states that his father (the pt) does not wish to keep appt for tomorrow.  He agreed to reschedule with Dr Russella Dar for next available in Dec.  He will call back if he wants to try and get in sooner but the son tells me that his father does not want an appt and he will try to get him to keep the Dec appt with Dr Russella Dar.

## 2022-09-26 NOTE — Telephone Encounter (Signed)
Patients son called stating that his father wished to cancel appointment for tomorrow at 11:00 due to not feeling well. Requested that if there was another urgent spot, he would like to be rescheduled in that time slot. Please advise.

## 2022-09-26 NOTE — Telephone Encounter (Signed)
Called and spoke with patient's son [name] and confirmed appt for 09/27/22

## 2022-09-27 ENCOUNTER — Ambulatory Visit: Payer: PPO | Admitting: Nurse Practitioner

## 2022-10-22 ENCOUNTER — Ambulatory Visit: Payer: PPO | Admitting: Physician Assistant

## 2022-10-24 ENCOUNTER — Encounter: Payer: PPO | Admitting: Internal Medicine

## 2022-10-30 DIAGNOSIS — H268 Other specified cataract: Secondary | ICD-10-CM | POA: Diagnosis not present

## 2022-10-30 DIAGNOSIS — H25812 Combined forms of age-related cataract, left eye: Secondary | ICD-10-CM | POA: Diagnosis not present

## 2022-10-30 DIAGNOSIS — H2512 Age-related nuclear cataract, left eye: Secondary | ICD-10-CM | POA: Diagnosis not present

## 2022-11-02 ENCOUNTER — Ambulatory Visit: Payer: PPO | Admitting: Internal Medicine

## 2022-11-08 ENCOUNTER — Other Ambulatory Visit: Payer: Self-pay | Admitting: Internal Medicine

## 2022-11-08 DIAGNOSIS — L089 Local infection of the skin and subcutaneous tissue, unspecified: Secondary | ICD-10-CM

## 2022-11-13 ENCOUNTER — Encounter: Payer: Self-pay | Admitting: Internal Medicine

## 2022-11-13 ENCOUNTER — Ambulatory Visit (INDEPENDENT_AMBULATORY_CARE_PROVIDER_SITE_OTHER): Payer: PPO | Admitting: Internal Medicine

## 2022-11-13 VITALS — BP 138/86 | HR 92 | Ht 74.0 in | Wt 167.0 lb

## 2022-11-13 DIAGNOSIS — E1042 Type 1 diabetes mellitus with diabetic polyneuropathy: Secondary | ICD-10-CM

## 2022-11-13 LAB — POCT GLUCOSE (DEVICE FOR HOME USE): POC Glucose: 399 mg/dL — AB (ref 70–99)

## 2022-11-13 LAB — POCT GLYCOSYLATED HEMOGLOBIN (HGB A1C): Hemoglobin A1C: 9.8 % — AB (ref 4.0–5.6)

## 2022-11-13 MED ORDER — OMNIPOD 5 G7 PODS (GEN 5) MISC: 1.0000 | 3 refills | Status: DC

## 2022-11-13 MED ORDER — DEXCOM G7 SENSOR MISC: 1.0000 | 3 refills | Status: DC

## 2022-11-13 MED ORDER — GVOKE HYPOPEN 1-PACK 1 MG/0.2ML ~~LOC~~ SOAJ: 1.0000 mg | Freq: Once | SUBCUTANEOUS | 1 refills | Status: AC

## 2022-11-13 NOTE — Progress Notes (Signed)
Name: [name] [name]  Age/ Sex: 67 y.o., male   MRN/ DOB: [id number], 1955/01/11     PCP: Bary Leriche, PA-C   Reason for Endocrinology Evaluation: Type 2 Diabetes Mellitus  Initial Endocrine Consultative Visit: 07/15/2020    PATIENT IDENTIFIER: [name] [name] is a 67 y.o. male with a past medical history of DM, CAD, HTN, PVD, PAF. The patient has followed with Endocrinology clinic since 07/15/2020 for consultative assistance with management of his diabetes.  DIABETIC HISTORY:  [name] [name] was diagnosed with DM in 2011, and started insulin therapy in 2013. His hemoglobin A1c has ranged from 9.2% in 2023, peaking at >15.0% in 2022.  Saw Dr. Everardo All last in 04/2021 SUBJECTIVE:   During the last visit (06/08/2022): A1c 11.2%  Today (11/13/2022): [name] [name] is here for a follow up on diabetes management.  He checks his blood sugars multiple  times daily.  The patient was found by the son last Friday with altered mental status due to hypoglycemia.  The patient did not have Dexcom on at the time  Patient was evaluated by his PCP for incontinence of feces, on loperamide, referred to GI  Had a follow up with Podiatry 9//2024   Denies nausea, vomiting  Patient continues with chronic diarrhea    This patient with type 2 diabetes is treated with Omnipod  (insulin pump). During the visit the pump basal and bolus doses were reviewed including carb/insulin rations and supplemental doses. The clinical list was updated. The glucose meter download was reviewed in detail to determine if the current pump settings are providing the best glycemic control without excessive hypoglycemia.  Pump and meter download:       Pump   Omnipod Settings   Insulin type   Novolog    Basal rate       0000 0.85              I:C ratio       0000 1:1    Enter # 5g              Sensitivity       0000  55      Goal       0000  120          Type & Model of Pump: Omnipod  Insulin  Type: Currently using Novolog .    PUMP STATISTICS: Average BG: 299 Average Daily Carbs (g): 0 Average Total Daily Insulin: 6.3 Average Daily Basal: 6.3 (100%) Average Daily Bolus: 0 (0%)        HOME DIABETES REGIMEN:  Novolog    Statin: yes ACE-I/ARB: yes    CONTINUOUS GLUCOSE MONITORING RECORD INTERPRETATION    Dates of Recording: 10/30-11/12/2022  Sensor description:dexcom  Results statistics:   CGM use % of time 73.6  Average and SD 299/66  Time in range 4 %  % Time Above 180 22  % Time above 250 74  % Time Below target 0      Glycemic patterns summary: Hyperglycemia noted during the day and night   Hyperglycemic episodes all day and night  Hypoglycemic episodes occurred N/A  Overnight periods: High     DIABETIC COMPLICATIONS: Microvascular complications:  Retinopathy B/L , blind in left eye  Denies: CKD Last Eye Exam: Completed 05/11/2022  Macrovascular complications:  CAD (S/P CABG) Denies: CVA,   HISTORY:  Past Medical History:  Past Medical History:  Diagnosis Date   Anxiety    Arthritis  Cataract    CHF (congestive heart failure) (HCC)    Constipation    pt states goes EOD- hard stools    Coronary artery disease    Depression    Diabetes mellitus without complication (HCC)    Diabetic retinopathy (HCC)    DKA (diabetic ketoacidoses)    Hyperlipidemia    Hypertension    off meds   Hypertensive retinopathy    Postherpetic neuralgia 08/11/2021   Speculative diagnosis   Psoriasis    Tubular adenoma of colon 03/2019   Past Surgical History:  Past Surgical History:  Procedure Laterality Date   ABDOMINAL AORTOGRAM W/LOWER EXTREMITY N/A 08/30/2020   Procedure: ABDOMINAL AORTOGRAM W/LOWER EXTREMITY;  Surgeon: Nada Libman, MD;  [facility];  Service: Cardiovascular;  Laterality: N/A;   ABDOMINAL AORTOGRAM W/LOWER EXTREMITY N/A 11/15/2020   Procedure: ABDOMINAL AORTOGRAM W/LOWER EXTREMITY;  Surgeon:  Nada Libman, MD;  [facility];  Service: Cardiovascular;  Laterality: N/A;   ANKLE SURGERY     right   CARDIAC CATHETERIZATION  09/25/2001   COLONOSCOPY     ~10 yr ago- normal  per pt    CORONARY ARTERY BYPASS GRAFT  09/30/2001   CABG x 4   IR THORACENTESIS ASP PLEURAL SPACE W/IMG GUIDE  05/19/2020   LAPAROSCOPIC INGUINAL HERNIA REPAIR Bilateral 02/09/2010   MASS EXCISION Left 03/12/2013   Procedure: LEFT INDEX EXCISION MASS AND DEBRIDMENT DISTAL INTERPHALANGEAL JOINT;  Surgeon: Tami Ribas, MD;  Location: [facility];  Service: Orthopedics;  Laterality: Left;   PERIPHERAL VASCULAR BALLOON ANGIOPLASTY Left 08/30/2020   Procedure: PERIPHERAL VASCULAR BALLOON ANGIOPLASTY;  Surgeon: Nada Libman, MD;  [facility];  Service: Cardiovascular;  Laterality: Left;  Peroneal   Social History:  reports that he quit smoking about 2 years ago. His smoking use included cigars. He has never used smokeless tobacco. He reports current alcohol use. He reports that he does not use drugs. Family History:  Family History  Problem Relation Age of Onset   Lymphoma Father    Cancer Paternal Grandmother        Colon Cancer   Colon cancer Paternal Grandmother    Colon polyps Neg Hx    Esophageal cancer Neg Hx    Rectal cancer Neg Hx    Stomach cancer Neg Hx    Diabetes Mellitus I Neg Hx    Pancreatic cancer Neg Hx      HOME MEDICATIONS: Allergies as of 11/13/2022       Reactions   Wool Alcohol [lanolin] Hives, Itching   Wool fabric        Medication List        Accurate as of November 13, 2022  3:43 PM. If you have any questions, ask your nurse or doctor.          STOP taking these medications    BD Pen Needle Nano 2nd Gen 32G X 4 MM Misc Generic drug: Insulin Pen Needle Stopped by: Johnney Ou Avondre Richens   Dexcom G6 Transmitter Misc Stopped by: Johnney Ou Meeka Cartelli       TAKE these medications    acetaminophen 500 MG  tablet Commonly known as: TYLENOL Take 500 mg by mouth as needed for mild pain or moderate pain.   apixaban 5 MG Tabs tablet Commonly known as: Eliquis Take 1 tablet (5 mg total) by mouth 2 (two) times daily.   aspirin EC 81 MG tablet Take 1 tablet (81  mg total) by mouth daily. What changed: when to take this   atorvastatin 40 MG tablet Commonly known as: LIPITOR TAKE 1 TABLET BY MOUTH EVERY DAY   Dexcom G7 Sensor Misc 1 Device by Does not apply route as directed. What changed:  when to take this additional instructions Changed by: Johnney Ou Hero Mccathern   Disposable Brief Large Misc 1 each by Does not apply route as needed.   furosemide 40 MG tablet Commonly known as: LASIX TAKE 1 TABLET BY MOUTH EVERY DAY   Gvoke HypoPen 1-Pack 1 MG/0.2ML Soaj Generic drug: Glucagon Inject 1 mg into the skin once for 1 dose. Started by: Johnney Ou [place] Moyd   insulin aspart 100 UNIT/ML injection Commonly known as: NovoLOG Max daily 50 units   insulin glargine 100 UNIT/ML injection Commonly known as: LANTUS Inject into the skin.   loperamide 2 MG tablet Commonly known as: IMODIUM A-D Take 1 tablet (2 mg total) by mouth 4 (four) times daily as needed for diarrhea or loose stools.   loperamide 2 MG capsule Commonly known as: IMODIUM Take 2 mg by mouth 4 (four) times daily as needed.   losartan 50 MG tablet Commonly known as: COZAAR Take 1 tablet (50 mg total) by mouth daily.   mirtazapine 15 MG tablet Commonly known as: REMERON Take by mouth.   mirtazapine 15 MG tablet Commonly known as: REMERON TAKE 1 TABLET BY MOUTH EVERYDAY AT BEDTIME   multivitamin with minerals tablet Take 1 tablet by mouth daily.   mupirocin ointment 2 % Commonly known as: BACTROBAN Apply 1 Application topically daily.   Omnipod 5 G7 Pods (Gen 5) Misc 1 Device by Does not apply route every other day. What changed:  See the new instructions. Another medication with the same name was removed.  Continue taking this medication, and follow the directions you see here. Changed by: Johnney Ou Shelvy Perazzo   PARoxetine 20 MG tablet Commonly known as: PAXIL TAKE 1 TABLET BY MOUTH EVERY DAY What changed: when to take this   spironolactone 25 MG tablet Commonly known as: ALDACTONE TAKE 1 TABLET BY MOUTH EVERY DAY         OBJECTIVE:   Vital Signs: BP 138/86 (BP Location: Right Arm, Patient Position: Sitting, Cuff Size: Large)   Pulse 92   Ht 6\' 2"  (1.88 m)   Wt 167 lb (75.8 kg)   SpO2 97%   BMI 21.44 kg/m   Wt Readings from Last 3 Encounters:  11/13/22 167 lb (75.8 kg)  09/20/22 176 lb 9.6 oz (80.1 kg)  06/08/22 165 lb 9.6 oz (75.1 kg)     Exam: General: Pt appears well and is in NAD  Lungs: Clear with good BS bilat   Heart: RRR   Neuro: MS is good with appropriate affect, pt is alert and Ox3   Dm Foot Exam 09/05/2022 per podiatry      DATA REVIEWED:  Lab Results  Component Value Date   HGBA1C 9.8 (A) 11/13/2022   HGBA1C 11.2 (A) 06/08/2022   HGBA1C 7.8 (H) 10/23/2021    Latest Reference Range & Units 01/06/22 01:01  Sodium 135 - 145 mmol/L 133 (L)  Potassium 3.5 - 5.1 mmol/L 3.5  Chloride 98 - 111 mmol/L 98  CO2 22 - 32 mmol/L 30  Glucose 70 - 99 mg/dL 161 (H)  BUN 8 - 23 mg/dL 17  Creatinine 0.96 - 0.45 mg/dL 4.09  Calcium 8.9 - 81.1 mg/dL 7.9 (L)  Anion gap 5 - 15  5  GFR, Estimated >60 mL/min >60  (L): Data is abnormally low (H): Data is abnormally high   ASSESSMENT / PLAN / RECOMMENDATIONS:   1) Type 2 Diabetes Mellitus, poorly controlled, With retinopathic , neuropathic, retinopathic and macrovascular  complications - Most recent A1c of 9.8 %. Goal A1c < 7.0 %.   -Patient continues with hyperglycemia but it is trending down -He continues to struggle with managing the pump, he is accompanied by his son today. -Patient does not bolus, per son the patient is having visual impairment and unable to enter any carbohydrates -The patient is  consistently in the manual mode, I tried to explain the troubleshooting process but we have opted after a couple times to proceed with urgent referral to our CDE for retraining on how to troubleshoot the pump -His in office BG was 399 Mg/DL, which was entered into the pump, patient will receive 4.9 units -Since this is the first time that he is using the bolus, I have asked the son/patient to monitor glucose to make sure that his sensitivity factor is not too strong and to notify us with hypoglycemia -A prescription of Gvoke he was sent to the pharmacy, and I have shown the son on proper use -I have upgraded his pump to Dexcom G7 and OmniPod G7  MEDICATIONS: NovoLog     Pump   Omnipod Settings   Insulin type   Novolog    Basal rate       0000 0.85              I:C ratio       0000 1:1    Enter # 4 g              Sensitivity       0000  55      Goal       0000  120            EDUCATION / INSTRUCTIONS: BG monitoring instructions: Patient is instructed to check his blood sugars before times a day, before each meal and bedtime. Call [facility] if: BG persistently < 70  I reviewed the Rule of 15 for the treatment of hypoglycemia in detail with the patient. Literature supplied.     2) Diabetic complications:  Eye: Does  have known diabetic retinopathy.  Neuro/ Feet: Does have known diabetic peripheral neuropathy .  Renal: Patient does not have known baseline CKD. He   is  on an ACEI/ARB at present.    Follow-up in 3 months  I spent 40 minutes preparing to see the patient by review of recent labs, imaging and procedures, obtaining and reviewing separately obtained history, communicating with the patient/family or caregiver, ordering medications, tests or procedures, and documenting clinical information in the EHR including the differential Dx, treatment, and any further evaluation and other management    Signed electronically by: Lyndle Herrlich, MD  [facility] End[facility]  [facility] [address] [place] [place], [place] [address] Phone: [phone] FAX: [phone]   CC: [name], [name], PA-C [address] [place] [address] Phone: [phone]  Fax: [phone]  Return to Endocrinology clinic as below: Future Appointments  Date Time Provider Department Center  12/04/2022  8:20 AM O'Neal, Ronnald Ramp, MD [facility] None  12/05/2022  1:45 PM Helane Gunther, DPM [facility]  12/11/2022  2:30 PM Meryl Dare, MD [facility]  02/14/2023  7:30 AM Jeovanny Cuadros, Konrad Dolores, MD [facility] None

## 2022-11-13 NOTE — Patient Instructions (Signed)
Please enter 4 g into the pump each time you eat a meal with breakfast, lunch, and dinner   HOW TO TREAT LOW BLOOD SUGARS (Blood sugar LESS THAN 70 MG/DL) Please follow the RULE OF 15 for the treatment of hypoglycemia treatment (when your (blood sugars are less than 70 mg/dL)   STEP 1: Take 15 grams of carbohydrates when your blood sugar is low, which includes:  3-4 GLUCOSE TABS  OR 3-4 OZ OF JUICE OR REGULAR SODA OR ONE TUBE OF GLUCOSE GEL    STEP 2: RECHECK blood sugar in 15 MINUTES STEP 3: If your blood sugar is still low at the 15 minute recheck --> then, go back to STEP 1 and treat AGAIN with another 15 grams of carbohydrates.

## 2022-11-14 ENCOUNTER — Encounter: Payer: Self-pay | Admitting: Internal Medicine

## 2022-11-15 ENCOUNTER — Encounter: Payer: Self-pay | Admitting: Internal Medicine

## 2022-11-15 ENCOUNTER — Telehealth: Payer: Self-pay | Admitting: Internal Medicine

## 2022-11-15 NOTE — Telephone Encounter (Signed)
I have reviewed his pump download, please asked the patient to decrease his basal rate from 0.85 to 0.75   Thanks

## 2022-11-15 NOTE — Telephone Encounter (Signed)
Returned son's call.  He is inquiring more about a different pump that may be easier for his father to use with visual issues.  Discussed the iLet Pancrease.  Will fax info to this company to begin process of obtaining this for the patient.  Oran Rein, RD, LDN, CDCES

## 2022-11-15 NOTE — Telephone Encounter (Signed)
Patient son [name] is aware and will make changes.

## 2022-11-15 NOTE — Telephone Encounter (Signed)
Patient received a call from [facility] yesterday for new pump and wants to know if you would be able to assist.  He is aware that Bonita Quin will return on Monday

## 2022-11-20 ENCOUNTER — Emergency Department (HOSPITAL_BASED_OUTPATIENT_CLINIC_OR_DEPARTMENT_OTHER): Payer: PPO | Admitting: Radiology

## 2022-11-20 ENCOUNTER — Ambulatory Visit (INDEPENDENT_AMBULATORY_CARE_PROVIDER_SITE_OTHER): Payer: PPO | Admitting: Physician Assistant

## 2022-11-20 ENCOUNTER — Other Ambulatory Visit: Payer: Self-pay

## 2022-11-20 ENCOUNTER — Encounter (HOSPITAL_BASED_OUTPATIENT_CLINIC_OR_DEPARTMENT_OTHER): Payer: Self-pay | Admitting: *Deleted

## 2022-11-20 ENCOUNTER — Inpatient Hospital Stay (HOSPITAL_BASED_OUTPATIENT_CLINIC_OR_DEPARTMENT_OTHER)
Admission: EM | Admit: 2022-11-20 | Discharge: 2022-11-30 | DRG: 291 | Disposition: A | Discharge status: Skilled Nursing Facility | Payer: PPO | Attending: Internal Medicine | Admitting: Internal Medicine

## 2022-11-20 ENCOUNTER — Encounter: Payer: Self-pay | Admitting: Physician Assistant

## 2022-11-20 ENCOUNTER — Emergency Department (HOSPITAL_BASED_OUTPATIENT_CLINIC_OR_DEPARTMENT_OTHER): Payer: PPO

## 2022-11-20 VITALS — BP 210/108 | HR 56 | Temp 97.4°F | Ht 74.0 in | Wt 170.4 lb

## 2022-11-20 DIAGNOSIS — N179 Acute kidney failure, unspecified: Secondary | ICD-10-CM | POA: Diagnosis not present

## 2022-11-20 DIAGNOSIS — I161 Hypertensive emergency: Principal | ICD-10-CM | POA: Diagnosis present

## 2022-11-20 DIAGNOSIS — I2510 Atherosclerotic heart disease of native coronary artery without angina pectoris: Secondary | ICD-10-CM | POA: Diagnosis present

## 2022-11-20 DIAGNOSIS — L89621 Pressure ulcer of left heel, stage 1: Secondary | ICD-10-CM | POA: Diagnosis not present

## 2022-11-20 DIAGNOSIS — Z91199 Patient's noncompliance with other medical treatment and regimen due to unspecified reason: Secondary | ICD-10-CM

## 2022-11-20 DIAGNOSIS — Z5982 Transportation insecurity: Secondary | ICD-10-CM

## 2022-11-20 DIAGNOSIS — N1831 Chronic kidney disease, stage 3a: Secondary | ICD-10-CM | POA: Diagnosis present

## 2022-11-20 DIAGNOSIS — R441 Visual hallucinations: Secondary | ICD-10-CM | POA: Diagnosis present

## 2022-11-20 DIAGNOSIS — L89311 Pressure ulcer of right buttock, stage 1: Secondary | ICD-10-CM | POA: Diagnosis not present

## 2022-11-20 DIAGNOSIS — E119 Type 2 diabetes mellitus without complications: Secondary | ICD-10-CM

## 2022-11-20 DIAGNOSIS — Y92009 Unspecified place in unspecified non-institutional (private) residence as the place of occurrence of the external cause: Secondary | ICD-10-CM | POA: Diagnosis not present

## 2022-11-20 DIAGNOSIS — I130 Hypertensive heart and chronic kidney disease with heart failure and stage 1 through stage 4 chronic kidney disease, or unspecified chronic kidney disease: Secondary | ICD-10-CM | POA: Diagnosis not present

## 2022-11-20 DIAGNOSIS — Z781 Physical restraint status: Secondary | ICD-10-CM | POA: Diagnosis not present

## 2022-11-20 DIAGNOSIS — I63411 Cerebral infarction due to embolism of right middle cerebral artery: Secondary | ICD-10-CM | POA: Diagnosis not present

## 2022-11-20 DIAGNOSIS — I480 Paroxysmal atrial fibrillation: Secondary | ICD-10-CM | POA: Diagnosis present

## 2022-11-20 DIAGNOSIS — I160 Hypertensive urgency: Secondary | ICD-10-CM | POA: Diagnosis not present

## 2022-11-20 DIAGNOSIS — Z860101 Personal history of adenomatous and serrated colon polyps: Secondary | ICD-10-CM

## 2022-11-20 DIAGNOSIS — E10649 Type 1 diabetes mellitus with hypoglycemia without coma: Secondary | ICD-10-CM | POA: Diagnosis present

## 2022-11-20 DIAGNOSIS — I4891 Unspecified atrial fibrillation: Secondary | ICD-10-CM | POA: Diagnosis present

## 2022-11-20 DIAGNOSIS — Z794 Long term (current) use of insulin: Secondary | ICD-10-CM

## 2022-11-20 DIAGNOSIS — E43 Unspecified severe protein-calorie malnutrition: Secondary | ICD-10-CM | POA: Diagnosis present

## 2022-11-20 DIAGNOSIS — Z7901 Long term (current) use of anticoagulants: Secondary | ICD-10-CM | POA: Diagnosis not present

## 2022-11-20 DIAGNOSIS — E1022 Type 1 diabetes mellitus with diabetic chronic kidney disease: Secondary | ICD-10-CM | POA: Diagnosis present

## 2022-11-20 DIAGNOSIS — F419 Anxiety disorder, unspecified: Secondary | ICD-10-CM | POA: Diagnosis present

## 2022-11-20 DIAGNOSIS — F331 Major depressive disorder, recurrent, moderate: Secondary | ICD-10-CM

## 2022-11-20 DIAGNOSIS — E1051 Type 1 diabetes mellitus with diabetic peripheral angiopathy without gangrene: Secondary | ICD-10-CM | POA: Diagnosis not present

## 2022-11-20 DIAGNOSIS — Z609 Problem related to social environment, unspecified: Secondary | ICD-10-CM | POA: Diagnosis present

## 2022-11-20 DIAGNOSIS — R296 Repeated falls: Secondary | ICD-10-CM | POA: Diagnosis not present

## 2022-11-20 DIAGNOSIS — Z91128 Patient's intentional underdosing of medication regimen for other reason: Secondary | ICD-10-CM

## 2022-11-20 DIAGNOSIS — R918 Other nonspecific abnormal finding of lung field: Secondary | ICD-10-CM | POA: Diagnosis not present

## 2022-11-20 DIAGNOSIS — E1042 Type 1 diabetes mellitus with diabetic polyneuropathy: Secondary | ICD-10-CM | POA: Diagnosis not present

## 2022-11-20 DIAGNOSIS — Z807 Family history of other malignant neoplasms of lymphoid, hematopoietic and related tissues: Secondary | ICD-10-CM

## 2022-11-20 DIAGNOSIS — I4892 Unspecified atrial flutter: Secondary | ICD-10-CM | POA: Diagnosis present

## 2022-11-20 DIAGNOSIS — R531 Weakness: Secondary | ICD-10-CM | POA: Diagnosis not present

## 2022-11-20 DIAGNOSIS — Z9641 Presence of insulin pump (external) (internal): Secondary | ICD-10-CM | POA: Diagnosis present

## 2022-11-20 DIAGNOSIS — I509 Heart failure, unspecified: Secondary | ICD-10-CM

## 2022-11-20 DIAGNOSIS — Z951 Presence of aortocoronary bypass graft: Secondary | ICD-10-CM

## 2022-11-20 DIAGNOSIS — N400 Enlarged prostate without lower urinary tract symptoms: Secondary | ICD-10-CM | POA: Diagnosis present

## 2022-11-20 DIAGNOSIS — I110 Hypertensive heart disease with heart failure: Secondary | ICD-10-CM | POA: Diagnosis not present

## 2022-11-20 DIAGNOSIS — G4700 Insomnia, unspecified: Secondary | ICD-10-CM | POA: Diagnosis present

## 2022-11-20 DIAGNOSIS — F32A Depression, unspecified: Secondary | ICD-10-CM | POA: Diagnosis not present

## 2022-11-20 DIAGNOSIS — R0989 Other specified symptoms and signs involving the circulatory and respiratory systems: Secondary | ICD-10-CM | POA: Diagnosis not present

## 2022-11-20 DIAGNOSIS — I672 Cerebral atherosclerosis: Secondary | ICD-10-CM | POA: Diagnosis not present

## 2022-11-20 DIAGNOSIS — Z87891 Personal history of nicotine dependence: Secondary | ICD-10-CM

## 2022-11-20 DIAGNOSIS — Z6821 Body mass index (BMI) 21.0-21.9, adult: Secondary | ICD-10-CM

## 2022-11-20 DIAGNOSIS — T500X6A Underdosing of mineralocorticoids and their antagonists, initial encounter: Secondary | ICD-10-CM | POA: Diagnosis present

## 2022-11-20 DIAGNOSIS — Z7982 Long term (current) use of aspirin: Secondary | ICD-10-CM

## 2022-11-20 DIAGNOSIS — L89321 Pressure ulcer of left buttock, stage 1: Secondary | ICD-10-CM | POA: Diagnosis not present

## 2022-11-20 DIAGNOSIS — E1142 Type 2 diabetes mellitus with diabetic polyneuropathy: Secondary | ICD-10-CM

## 2022-11-20 DIAGNOSIS — R0902 Hypoxemia: Secondary | ICD-10-CM | POA: Diagnosis not present

## 2022-11-20 DIAGNOSIS — Z743 Need for continuous supervision: Secondary | ICD-10-CM

## 2022-11-20 DIAGNOSIS — E785 Hyperlipidemia, unspecified: Secondary | ICD-10-CM | POA: Diagnosis not present

## 2022-11-20 DIAGNOSIS — Z8673 Personal history of transient ischemic attack (TIA), and cerebral infarction without residual deficits: Secondary | ICD-10-CM | POA: Insufficient documentation

## 2022-11-20 DIAGNOSIS — T465X6A Underdosing of other antihypertensive drugs, initial encounter: Secondary | ICD-10-CM | POA: Diagnosis present

## 2022-11-20 DIAGNOSIS — I639 Cerebral infarction, unspecified: Secondary | ICD-10-CM | POA: Insufficient documentation

## 2022-11-20 DIAGNOSIS — E860 Dehydration: Secondary | ICD-10-CM | POA: Diagnosis present

## 2022-11-20 DIAGNOSIS — H9190 Unspecified hearing loss, unspecified ear: Secondary | ICD-10-CM | POA: Diagnosis present

## 2022-11-20 DIAGNOSIS — Z800 Family history of malignant neoplasm of digestive organs: Secondary | ICD-10-CM

## 2022-11-20 DIAGNOSIS — Z9181 History of falling: Secondary | ICD-10-CM

## 2022-11-20 DIAGNOSIS — T45516A Underdosing of anticoagulants, initial encounter: Secondary | ICD-10-CM | POA: Diagnosis present

## 2022-11-20 DIAGNOSIS — I5033 Acute on chronic diastolic (congestive) heart failure: Secondary | ICD-10-CM | POA: Diagnosis not present

## 2022-11-20 DIAGNOSIS — E10319 Type 1 diabetes mellitus with unspecified diabetic retinopathy without macular edema: Secondary | ICD-10-CM | POA: Diagnosis present

## 2022-11-20 DIAGNOSIS — I959 Hypotension, unspecified: Secondary | ICD-10-CM | POA: Diagnosis present

## 2022-11-20 DIAGNOSIS — Z5941 Food insecurity: Secondary | ICD-10-CM

## 2022-11-20 DIAGNOSIS — J9811 Atelectasis: Secondary | ICD-10-CM | POA: Diagnosis not present

## 2022-11-20 DIAGNOSIS — I330 Acute and subacute infective endocarditis: Secondary | ICD-10-CM | POA: Insufficient documentation

## 2022-11-20 DIAGNOSIS — Z1211 Encounter for screening for malignant neoplasm of colon: Secondary | ICD-10-CM

## 2022-11-20 DIAGNOSIS — R4182 Altered mental status, unspecified: Secondary | ICD-10-CM | POA: Diagnosis not present

## 2022-11-20 DIAGNOSIS — Z79899 Other long term (current) drug therapy: Secondary | ICD-10-CM

## 2022-11-20 LAB — CBC WITH DIFFERENTIAL/PLATELET
Abs Immature Granulocytes: 0.01 10*3/uL (ref 0.00–0.07)
Basophils Absolute: 0.1 10*3/uL (ref 0.0–0.1)
Basophils Relative: 1 %
Eosinophils Absolute: 0.1 10*3/uL (ref 0.0–0.5)
Eosinophils Relative: 2 %
HCT: 38.9 % — ABNORMAL LOW (ref 39.0–52.0)
Hemoglobin: 12.6 g/dL — ABNORMAL LOW (ref 13.0–17.0)
Immature Granulocytes: 0 %
Lymphocytes Relative: 25 %
Lymphs Abs: 1.3 10*3/uL (ref 0.7–4.0)
MCH: 29.6 pg (ref 26.0–34.0)
MCHC: 32.4 g/dL (ref 30.0–36.0)
MCV: 91.3 fL (ref 80.0–100.0)
Monocytes Absolute: 0.5 10*3/uL (ref 0.1–1.0)
Monocytes Relative: 10 %
Neutro Abs: 3.3 10*3/uL (ref 1.7–7.7)
Neutrophils Relative %: 62 %
Platelets: 158 10*3/uL (ref 150–400)
RBC: 4.26 MIL/uL (ref 4.22–5.81)
RDW: 14 % (ref 11.5–15.5)
WBC: 5.3 10*3/uL (ref 4.0–10.5)
nRBC: 0 % (ref 0.0–0.2)

## 2022-11-20 LAB — URINALYSIS, ROUTINE W REFLEX MICROSCOPIC
Bacteria, UA: NONE SEEN
Bilirubin Urine: NEGATIVE
Glucose, UA: 100 mg/dL — AB
Ketones, ur: NEGATIVE mg/dL
Leukocytes,Ua: NEGATIVE
Nitrite: NEGATIVE
Protein, ur: >300 mg/dL — AB
Specific Gravity, Urine: 1.015 (ref 1.005–1.030)
pH: 6.5 (ref 5.0–8.0)

## 2022-11-20 LAB — CBG MONITORING, ED
Glucose-Capillary: 89 mg/dL (ref 70–99)
Glucose-Capillary: 142 mg/dL — ABNORMAL HIGH (ref 70–99)

## 2022-11-20 LAB — COMPREHENSIVE METABOLIC PANEL
ALT: 8 U/L (ref 0–44)
AST: 29 U/L (ref 15–41)
Albumin: 2.9 g/dL — ABNORMAL LOW (ref 3.5–5.0)
Alkaline Phosphatase: 87 U/L (ref 38–126)
Anion gap: 4 — ABNORMAL LOW (ref 5–15)
BUN: 14 mg/dL (ref 8–23)
CO2: 31 mmol/L (ref 22–32)
Calcium: 8.5 mg/dL — ABNORMAL LOW (ref 8.9–10.3)
Chloride: 103 mmol/L (ref 98–111)
Creatinine, Ser: 0.69 mg/dL (ref 0.61–1.24)
GFR, Estimated: >60 mL/min (ref >60)
Glucose, Bld: 99 mg/dL (ref 70–99)
Potassium: 4.5 mmol/L (ref 3.5–5.1)
Sodium: 138 mmol/L (ref 135–145)
Total Bilirubin: 0.6 mg/dL (ref <1.2)
Total Protein: 5.7 g/dL — ABNORMAL LOW (ref 6.5–8.1)

## 2022-11-20 LAB — GLUCOSE, CAPILLARY: Glucose-Capillary: 234 mg/dL — ABNORMAL HIGH (ref 70–99)

## 2022-11-20 LAB — TSH: TSH: 4.369 u[IU]/mL (ref 0.350–4.500)

## 2022-11-20 LAB — BRAIN NATRIURETIC PEPTIDE: B Natriuretic Peptide: 262.5 pg/mL — ABNORMAL HIGH (ref 0.0–100.0)

## 2022-11-20 MED ORDER — HYDRALAZINE HCL 50 MG PO TABS
50.0000 mg | ORAL_TABLET | Freq: Three times a day (TID) | ORAL | Status: DC
  Administered 2022-11-20 – 2022-11-22 (×5): 50 mg via ORAL
  Filled 2022-11-20 (×5): qty 1

## 2022-11-20 MED ORDER — THIAMINE MONONITRATE 100 MG PO TABS
200.0000 mg | ORAL_TABLET | Freq: Every day | ORAL | Status: DC
  Administered 2022-11-21 – 2022-11-30 (×9): 200 mg via ORAL
  Filled 2022-11-20 (×9): qty 2

## 2022-11-20 MED ORDER — LOSARTAN POTASSIUM 50 MG PO TABS
50.0000 mg | ORAL_TABLET | Freq: Every day | ORAL | Status: DC
  Administered 2022-11-20 – 2022-11-30 (×10): 50 mg via ORAL
  Filled 2022-11-20 (×3): qty 1
  Filled 2022-11-20: qty 2
  Filled 2022-11-20 (×6): qty 1

## 2022-11-20 MED ORDER — AMLODIPINE BESYLATE 10 MG PO TABS
10.0000 mg | ORAL_TABLET | Freq: Every day | ORAL | Status: DC
  Administered 2022-11-20 – 2022-11-29 (×9): 10 mg via ORAL
  Filled 2022-11-20 (×9): qty 1

## 2022-11-20 MED ORDER — HYDRALAZINE HCL 20 MG/ML IJ SOLN
10.0000 mg | Freq: Once | INTRAMUSCULAR | Status: AC
  Administered 2022-11-20: 10 mg via INTRAVENOUS
  Filled 2022-11-20: qty 1

## 2022-11-20 MED ORDER — MIRTAZAPINE 15 MG PO TABS
15.0000 mg | ORAL_TABLET | Freq: Every day | ORAL | Status: DC
  Administered 2022-11-20 – 2022-11-29 (×10): 15 mg via ORAL
  Filled 2022-11-20 (×10): qty 1

## 2022-11-20 MED ORDER — THIAMINE MONONITRATE 100 MG PO TABS: 200.0000 mg | ORAL_TABLET | Freq: Every day | ORAL | Status: DC

## 2022-11-20 MED ORDER — THIAMINE HCL 100 MG/ML IJ SOLN
500.0000 mg | Freq: Once | INTRAVENOUS | Status: AC
  Administered 2022-11-20: 500 mg via INTRAVENOUS
  Filled 2022-11-20: qty 5

## 2022-11-20 MED ORDER — HYDRALAZINE HCL 20 MG/ML IJ SOLN: 10.0000 mg | INTRAMUSCULAR | Status: DC | PRN

## 2022-11-20 MED ORDER — ASPIRIN 81 MG PO TBEC
81.0000 mg | DELAYED_RELEASE_TABLET | Freq: Every day | ORAL | Status: DC
  Administered 2022-11-21 – 2022-11-30 (×9): 81 mg via ORAL
  Filled 2022-11-20 (×9): qty 1

## 2022-11-20 MED ORDER — ATORVASTATIN CALCIUM 40 MG PO TABS
40.0000 mg | ORAL_TABLET | Freq: Every day | ORAL | Status: DC
  Administered 2022-11-20 – 2022-11-30 (×10): 40 mg via ORAL
  Filled 2022-11-20 (×10): qty 1

## 2022-11-20 MED ORDER — INSULIN ASPART 100 UNIT/ML IJ SOLN
0.0000 [IU] | Freq: Every day | INTRAMUSCULAR | Status: DC
  Administered 2022-11-20: 3 [IU] via SUBCUTANEOUS

## 2022-11-20 MED ORDER — ACETAMINOPHEN 325 MG PO TABS
650.0000 mg | ORAL_TABLET | ORAL | Status: DC | PRN
  Administered 2022-11-26 – 2022-11-29 (×2): 650 mg via ORAL
  Filled 2022-11-20 (×2): qty 2

## 2022-11-20 MED ORDER — PAROXETINE HCL 20 MG PO TABS
20.0000 mg | ORAL_TABLET | Freq: Every day | ORAL | Status: DC
  Administered 2022-11-21 – 2022-11-30 (×9): 20 mg via ORAL
  Filled 2022-11-20 (×10): qty 1

## 2022-11-20 MED ORDER — SODIUM CHLORIDE 0.9% FLUSH: 3.0000 mL | INTRAVENOUS | Status: DC | PRN

## 2022-11-20 MED ORDER — CLONIDINE HCL 0.1 MG PO TABS
0.2000 mg | ORAL_TABLET | Freq: Once | ORAL | Status: AC
  Administered 2022-11-20: 0.2 mg via ORAL
  Filled 2022-11-20: qty 2

## 2022-11-20 MED ORDER — SPIRONOLACTONE 25 MG PO TABS
25.0000 mg | ORAL_TABLET | Freq: Every day | ORAL | Status: DC
  Administered 2022-11-21 – 2022-11-30 (×9): 25 mg via ORAL
  Filled 2022-11-20 (×9): qty 1

## 2022-11-20 MED ORDER — APIXABAN 5 MG PO TABS
5.0000 mg | ORAL_TABLET | Freq: Two times a day (BID) | ORAL | Status: DC
  Administered 2022-11-20 – 2022-11-26 (×12): 5 mg via ORAL
  Filled 2022-11-20 (×13): qty 1

## 2022-11-20 MED ORDER — ONDANSETRON HCL 4 MG/2ML IJ SOLN
4.0000 mg | Freq: Four times a day (QID) | INTRAMUSCULAR | Status: DC | PRN
  Administered 2022-11-23 – 2022-11-25 (×3): 4 mg via INTRAVENOUS
  Filled 2022-11-20 (×3): qty 2

## 2022-11-20 MED ORDER — SODIUM CHLORIDE 0.9% FLUSH
3.0000 mL | Freq: Two times a day (BID) | INTRAVENOUS | Status: DC
  Administered 2022-11-20 – 2022-11-30 (×17): 3 mL via INTRAVENOUS

## 2022-11-20 MED ORDER — SODIUM CHLORIDE 0.9 % IV SOLN: 250.0000 mL | INTRAVENOUS | Status: AC | PRN

## 2022-11-20 MED ORDER — FUROSEMIDE 10 MG/ML IJ SOLN
40.0000 mg | Freq: Once | INTRAMUSCULAR | Status: AC
  Administered 2022-11-20: 40 mg via INTRAVENOUS
  Filled 2022-11-20: qty 4

## 2022-11-20 MED ORDER — FUROSEMIDE 10 MG/ML IJ SOLN
40.0000 mg | Freq: Two times a day (BID) | INTRAMUSCULAR | Status: DC
  Administered 2022-11-20 – 2022-11-25 (×11): 40 mg via INTRAVENOUS
  Filled 2022-11-20 (×11): qty 4

## 2022-11-20 MED ORDER — INSULIN ASPART 100 UNIT/ML IJ SOLN
0.0000 [IU] | Freq: Three times a day (TID) | INTRAMUSCULAR | Status: DC
  Administered 2022-11-21 (×2): 3 [IU] via SUBCUTANEOUS
  Administered 2022-11-21: 2 [IU] via SUBCUTANEOUS
  Administered 2022-11-22: 1 [IU] via SUBCUTANEOUS
  Administered 2022-11-22: 2 [IU] via SUBCUTANEOUS
  Administered 2022-11-22: 5 [IU] via SUBCUTANEOUS
  Administered 2022-11-23: 2 [IU] via SUBCUTANEOUS
  Administered 2022-11-23: 5 [IU] via SUBCUTANEOUS

## 2022-11-20 NOTE — ED Notes (Signed)
Pt aware of need for urine sample.  

## 2022-11-20 NOTE — Discharge Planning (Signed)
Nasha Diss J. Lucretia Roers, RN, BSN, [place] [phone] RNCM spoke with pt son via telephone regarding discharge planning for Home Health Services. RNCM offered medicare.gov list of home health agencies to choose from.  Pt chose [facility] to render RN, PT, and MSWservices. Lynette of [facility] accepted referral. Patient made aware that [facility] will be in contact in 24-48 hours.  No DME needs identified at this time.

## 2022-11-20 NOTE — Progress Notes (Signed)
Patient ID: [name] [name], male    DOB: [birth year], 67 y.o.   MRN: [id number]   Assessment & Plan:  Malignant hypertensive urgency -     AMB Referral [facility]  Screening for colon cancer  Type 1 diabetes mellitus with diabetic polyneuropathy (HCC) -     AMB Referral [facility]  Recurrent falls -     AMB Referral [facility]  Moderate episode of recurrent major depressive disorder (HCC) -     AMB Referral [facility]  Noncompliance -     AMB Referral [facility]  Requires continuous supervision for activities of daily living (ADL) -     AMB Referral [facility]    Assessment and Plan    Hypertension / Uncontrolled, with blood pressure significantly elevated during the visit. Patient reported inconsistent use of antihypertensive medication. No focal neuro findings on exam. Denies any concerning symptoms. However, this is the highest BP reading we have on file for him. I strongly advised patient and his son present to the ER today for further workup and treatment.  Noncompliance The son goes home to grab the patient's medications and comes back with prescription bottles that have a prior fill date of last year. Patient's Eliquis and Mirtazapine do not have his name or any dosing instructions on the bottles.  Most bottles are half-full of medicine or more, noting that he has not been taking his medications as directed.   Diabetes Recent episodes of hypoglycemia leading to falls and loss of consciousness. Patient reported inconsistent eating habits. Last event per son about a week and a half ago. Reports that EMS came out and gave glucose, patient seemed to improve after about an hour and a half, EMS stayed during that time per son, therefore they did not present to ER or have any other workup done. Patient denied any significant pain or injury. -Encourage regular meals to prevent hypoglycemia. -Continue monitoring  blood glucose levels. -follows with endocrinology  Depression Patient reported feeling "lost" and "down". History of depression noted. He denies any suicidal thoughts or plans today, stating that he wants to live. -Consider mental health evaluation and potential treatment options. -non-compliance with medications  Incontinence Patient reported recent episode of fecal incontinence. -he has an appointment scheduled next month with GI, encouraged him to keep this appointment  Fall Risk History of falls. Patient reported poor balance. -Advise use of walker for mobility and safety. -Consider home safety evaluation.  General Health Maintenance / Followup Plans -Discuss potential for home health or assisted living to provide additional support. -Plan follow-up appointment to monitor blood pressure, blood glucose, and overall health status.     *This is a very high-risk patient with complicated medical conditions and history of non-complicance, also complicated by SDOH factors. I have sent an urgent referral to have CCM help with care of patient, including setting up home health services as patient prefers to stay at home. However, my impression today is that patient is in need of continuous supervision and may benefit most from a long term care facility.  *Patient and his son are also agreeable for patient to follow with MD in future for PCP needs.    Return in about 2 weeks (around 12/04/2022) for recheck/follow-up TRANSFER OF CARE TO DR. MORRISON.    Subjective:    Chief Complaint  Patient presents with   Medical Management of Chronic Issues    Pt in office for follow up  and to discuss Home care needs; Son in office and states concerns over the past couple weeks with low blood sugar, came home pt fell, and another insistence pt almost passed out due to low blood sugar; pt not eating properly at times and causing low blood sugar. Pt is needing help at home to ensure he is eating, and  safe. Son recommends at home health care to ensure he is eating, and assistance with ADL.  Pt not sure when last took medicine, son admits not taking meds regular.    HPI History of Present Illness    Here with his son today. [name] [name], an elderly man with a history of hypertension, diabetes, and depression, presents with concerns raised by his son about his declining ability to care for himself. The patient has had recent episodes of hypoglycemia resulting in falls, which were managed at home with the help of his son. The patient admits to not eating properly and having issues with personal hygiene. The patient's son, who is his primary caregiver, is concerned about his father's health, particularly when he is away on business. The patient lives alone and has expressed discomfort with the idea of moving into assisted living. The patient's blood pressure is significantly elevated during the visit, raising concerns about potential stroke risk.       Patient has not had anything to eat yet today. Last night, he had poptarts for dinner. Glucose level is currently at 125.   Denies headache. Blurry vision, normal for him he says. No chest pain. No new SOB.  No abdominal pain. No nausea or vomiting. Last fecal incontinence about two days ago.    Past Medical History:  Diagnosis Date   Anxiety    Arthritis    Cataract    CHF (congestive heart failure) (HCC)    Constipation    pt states goes EOD- hard stools    Coronary artery disease    Depression    Diabetes mellitus without complication (HCC)    Diabetic retinopathy (HCC)    DKA (diabetic ketoacidoses)    Hyperlipidemia    Hypertension    off meds   Hypertensive retinopathy    Non-intractable vomiting without nausea 06/10/2018   Postherpetic neuralgia 08/11/2021   Speculative diagnosis   Psoriasis    Tubular adenoma of colon 03/2019    Past Surgical History:  Procedure Laterality Date   ABDOMINAL AORTOGRAM W/LOWER EXTREMITY N/A  08/30/2020   Procedure: ABDOMINAL AORTOGRAM W/LOWER EXTREMITY;  Surgeon: Nada Libman, MD;  Location: [facility];  Service: Cardiovascular;  Laterality: N/A;   ABDOMINAL AORTOGRAM W/LOWER EXTREMITY N/A 11/15/2020   Procedure: ABDOMINAL AORTOGRAM W/LOWER EXTREMITY;  Surgeon: Nada Libman, MD;  Location: [facility];  Service: Cardiovascular;  Laterality: N/A;   ANKLE SURGERY     right   CARDIAC CATHETERIZATION  09/25/2001   COLONOSCOPY     ~10 yr ago- normal  per pt    CORONARY ARTERY BYPASS GRAFT  09/30/2001   CABG x 4   IR THORACENTESIS ASP PLEURAL SPACE W/IMG GUIDE  05/19/2020   LAPAROSCOPIC INGUINAL HERNIA REPAIR Bilateral 02/09/2010   MASS EXCISION Left 03/12/2013   Procedure: LEFT INDEX EXCISION MASS AND DEBRIDMENT DISTAL INTERPHALANGEAL JOINT;  Surgeon: Tami Ribas, MD;  Location:  SUR[facility]ERY [facility];  Service: Orthopedics;  Laterality: Left;   PERIPHERAL VASCULAR BALLOON ANGIOPLASTY Left 08/30/2020   Procedure: PERIPHERAL VASCULAR BALLOON ANGIOPLASTY;  Surgeon: Nada Libman, MD;  Location: [facility];  Service: Cardiovascular;  Laterality: Left;  Peroneal    Family History  Problem Relation Age of Onset   Lymphoma Father    Cancer Paternal Grandmother        Colon Cancer   Colon cancer Paternal Grandmother    Colon polyps Neg Hx    Esophageal cancer Neg Hx    Rectal cancer Neg Hx    Stomach cancer Neg Hx    Diabetes Mellitus I Neg Hx    Pancreatic cancer Neg Hx     Social History   Tobacco Use   Smoking status: Former    Types: Cigars    Quit date: 2022    Years since quitting: 2.8   Smokeless tobacco: Never  Vaping Use   Vaping status: Never Used  Substance Use Topics   Alcohol use: Yes    Comment: about once a year   Drug use: No     Allergies  Allergen Reactions   Wool Alcohol [Lanolin] Hives and Itching    Wool fabric    Review of Systems NEGATIVE UNLESS OTHERWISE INDICATED IN HPI      Objective:     BP (!)  210/108 (BP Location: Right Arm, Patient Position: Sitting) Comment (Patient Position): Manual  Pulse (!) 56   Temp (!) 97.4 F (36.3 C) (Temporal)   Ht 6\' 2"  (1.88 m)   Wt 170 lb 6.4 oz (77.3 kg)   SpO2 97%   BMI 21.88 kg/m   Wt Readings from Last 3 Encounters:  11/20/22 170 lb 6.7 oz (77.3 kg)  11/20/22 170 lb 6.4 oz (77.3 kg)  11/13/22 167 lb (75.8 kg)    BP Readings from Last 3 Encounters:  11/20/22 (!) 233/102  11/20/22 (!) 210/108  11/13/22 138/86     Physical Exam Vitals and nursing note reviewed.  Constitutional:      General: He is not in acute distress.    Appearance: Normal appearance. He is not ill-appearing.     Comments: Wearing winter gloves  Using rollator walker, unsteady in general  Poor hygiene overall   HENT:     Right Ear: Tympanic membrane normal.     Left Ear: Tympanic membrane normal.     Mouth/Throat:     Mouth: Mucous membranes are dry.  Eyes:     Extraocular Movements: Extraocular movements intact.     Conjunctiva/sclera: Conjunctivae normal.     Pupils: Pupils are equal, round, and reactive to light.  Cardiovascular:     Rate and Rhythm: Bradycardia present. Rhythm irregular.     Heart sounds: No murmur heard. Pulmonary:     Effort: Pulmonary effort is normal.     Breath sounds: Normal breath sounds. No wheezing.  Musculoskeletal:     Right lower leg: Edema present.     Left lower leg: Edema present.  Neurological:     Mental Status: He is alert and oriented to person, place, and time.     Cranial Nerves: No cranial nerve deficit.     Motor: Weakness (generalized, unsteady) present.  Psychiatric:        Mood and Affect: Mood is depressed. Affect is flat.           Time Spent: 54 minutes of total time was spent on the date of the encounter performing the following actions: chart review prior to seeing the patient, obtaining history, performing a medically necessary exam, counseling on the treatment plan, placing orders, and  documenting in our EHR.  Lateshia Schmoker M Castle Lamons, PA-C

## 2022-11-20 NOTE — Progress Notes (Signed)
Patient arrived from [facility] ER w/ eleavted b/p. Attending MD paged. TELE applied and confirmed. Patient noted with confusion and forgetfulness, admission questionnaires obtained with difficulty. NO family present at bedside. He denied pain or any apparent distress. Will continue to monitor.

## 2022-11-20 NOTE — ED Notes (Signed)
Pt currently laying flat for orthostatic VS. 

## 2022-11-20 NOTE — H&P (Signed)
TRH H&P   Patient Demographics:    [name] [name], is a 67 y.o. male  MRN: [id number]   DOB - February 21, 1955  Admit Date - 11/20/2022  Outpatient Primary MD for the patient is [name], [name], PA-C  Referring MD/NP/PA: PA Geiple  Outpatient Specialists: cardiology Dr Flora Lipps   Patient coming from: PCP office to [facility]  Chief Complaint  Patient presents with   Hypertension      HPI:    [name] [name]  is a 67 y.o. male, with past medical history of type 2 diabetes mellitus, on insulin pump for last 3 years, CAD with history of CABG in 2003, hypertension, peripheral vascular disease, paroxysmal A-fib on Eliquis, with slow ventricular response, hyperlipidemia. -Patient was sent by his PCP to ED for uncontrolled blood pressure, sent helps with the history, patient lives by himself, he has been appearing not able to take care of himself at home, report patient had 2 episodes of hypoglycemia where he found his dad on the floor over last 2 weeks due to poor oral intake, as well patient has not been compliant with medication as he should be,  It was noted to have significantly elevated blood pressure at PCP office where he was sent for further evaluation. -In ED patient was noted with blood pressure 233/102, chest x-ray significant for vascular congestion-pulmonary edema, patient required multiple medication for blood pressure control including p.o. clonidine, losartan, IV hydralazine, blood pressure remains elevated, his BNP as well elevated at 262, so [name] consulted to admit.    Review of systems:     was done, except as stated above, all other Review of Systems were negative.   With Past History of the following :    Past Medical History:  Diagnosis Date   Anxiety    Arthritis    Cataract    CHF (congestive heart failure) (HCC)    Constipation    pt states  goes EOD- hard stools    Coronary artery disease    Depression    Diabetes mellitus without complication (HCC)    Diabetic retinopathy (HCC)    DKA (diabetic ketoacidoses)    Hyperlipidemia    Hypertension    off meds   Hypertensive retinopathy    Non-intractable vomiting without nausea 06/10/2018   Postherpetic neuralgia 08/11/2021   Speculative diagnosis   Psoriasis    Tubular adenoma of colon 03/2019      Past Surgical History:  Procedure Laterality Date   ABDOMINAL AORTOGRAM W/LOWER EXTREMITY N/A 08/30/2020   Procedure: ABDOMINAL AORTOGRAM W/LOWER EXTREMITY;  Surgeon: Nada Libman, MD;  Location: [facility];  Service: Cardiovascular;  Laterality: N/A;   ABDOMINAL AORTOGRAM W/LOWER EXTREMITY N/A 11/15/2020   Procedure: ABDOMINAL AORTOGRAM W/LOWER EXTREMITY;  Surgeon: Nada Libman, MD;  Location: [facility];  Service: Cardiovascular;  Laterality: N/A;   ANKLE SURGERY  right   CARDIAC CATHETERIZATION  09/25/2001   COLONOSCOPY     ~10 yr ago- normal  per pt    CORONARY ARTERY BYPASS GRAFT  09/30/2001   CABG x 4   IR THORACENTESIS ASP PLEURAL SPACE W/IMG GUIDE  05/19/2020   LAPAROSCOPIC INGUINAL HERNIA REPAIR Bilateral 02/09/2010   MASS EXCISION Left 03/12/2013   Procedure: LEFT INDEX EXCISION MASS AND DEBRIDMENT DISTAL INTERPHALANGEAL JOINT;  Surgeon: Tami Ribas, MD;  Location: [facility];  Service: Orthopedics;  Laterality: Left;   PERIPHERAL VASCULAR BALLOON ANGIOPLASTY Left 08/30/2020   Procedure: PERIPHERAL VASCULAR BALLOON ANGIOPLASTY;  Surgeon: Nada Libman, MD;  Location: [facility];  Service: Cardiovascular;  Laterality: Left;  Peroneal      Social History:     Social History   Tobacco Use   Smoking status: Former    Types: Cigars    Quit date: 2022    Years since quitting: 2.8   Smokeless tobacco: Never  Substance Use Topics   Alcohol use: Yes    Comment: about once a year      Family History :      Family History  Problem Relation Age of Onset   Lymphoma Father    Cancer Paternal Grandmother        Colon Cancer   Colon cancer Paternal Grandmother    Colon polyps Neg Hx    Esophageal cancer Neg Hx    Rectal cancer Neg Hx    Stomach cancer Neg Hx    Diabetes Mellitus I Neg Hx    Pancreatic cancer Neg Hx      Home Medications:   Prior to Admission medications   Medication Sig Start Date End Date Taking? Authorizing Provider  acetaminophen (TYLENOL) 500 MG tablet Take 500 mg by mouth as needed for mild pain or moderate pain.   Yes [provider]  apixaban (ELIQUIS) 5 MG TABS tablet Take 1 tablet (5 mg total) by mouth 2 (two) times daily. 03/02/22  Yes O'Neal, Ronnald Ramp, MD  aspirin EC 81 MG EC tablet Take 1 tablet (81 mg total) by mouth daily. Patient taking differently: Take 81 mg by mouth in the morning. 02/18/21  Yes Zannie Cove, MD  atorvastatin (LIPITOR) 40 MG tablet TAKE 1 TABLET BY MOUTH EVERY DAY 09/17/22  Yes [name], [name], PA-C  furosemide (LASIX) 40 MG tablet TAKE 1 TABLET BY MOUTH EVERY DAY 03/12/22  Yes [name], [name], PA-C  Insulin Disposable Pump (OMNIPOD 5 G7 PODS, GEN 5,) MISC 1 Device by Does not apply route every other day. 11/13/22  Yes Shamleffer, Konrad Dolores, MD  loperamide (IMODIUM) 2 MG capsule Take 2 mg by mouth 4 (four) times daily as needed for diarrhea or loose stools. 09/20/22  Yes [provider]  losartan (COZAAR) 50 MG tablet Take 1 tablet (50 mg total) by mouth daily. 01/06/22 11/20/22 Yes Narda Bonds, MD  mirtazapine (REMERON) 15 MG tablet TAKE 1 TABLET BY MOUTH EVERYDAY AT BEDTIME 04/19/22  Yes [name], [name], PA-C  Multiple Vitamins-Minerals (MULTIVITAMIN WITH MINERALS) tablet Take 1 tablet by mouth daily.   Yes [provider]  PARoxetine (PAXIL) 20 MG tablet TAKE 1 TABLET BY MOUTH EVERY DAY Patient taking differently: Take 20 mg by mouth in the morning. 10/02/21  Yes [name], [name], PA-C   spironolactone (ALDACTONE) 25 MG tablet TAKE 1 TABLET BY MOUTH EVERY DAY 04/19/22  Yes [name], [name], PA-C  Continuous Glucose Sensor (DEXCOM G7  SENSOR) MISC 1 Device by Does not apply route as directed. 11/13/22   Shamleffer, Konrad Dolores, MD  Incontinence Supply Disposable (DISPOSABLE BRIEF LARGE) MISC 1 each by Does not apply route as needed. 02/24/21   [name], [name], PA-C  insulin aspart (NOVOLOG) 100 UNIT/ML injection Max daily 50 units Patient not taking: Reported on 11/20/2022 06/08/22   Shamleffer, Konrad Dolores, MD  loperamide (IMODIUM A-D) 2 MG tablet Take 1 tablet (2 mg total) by mouth 4 (four) times daily as needed for diarrhea or loose stools. Patient not taking: Reported on 11/20/2022 09/20/22   [name], [name], PA-C     Allergies:     Allergies  Allergen Reactions   Wool Alcohol [Lanolin] Hives and Itching    Wool fabric     Physical Exam:   Vitals  Blood pressure (!) 196/92, pulse 62, temperature 98.2 F (36.8 C), temperature source Oral, resp. rate 17, weight 77.3 kg, SpO2 98%.   1. General Frail, elderly male, appears older than stated age, laying in bed, no apparent distress  2. Not Suicidal or Homicidal, Awake Alert, Oriented X 3.  But he remains forgetful, repeating himself with mild  impaired cognition and insight.  3. No F.N deficits, ALL C.Nerves Intact, Strength 5/5 all 4 extremities, Sensation intact all 4 extremities, Plantars down going.  4. Ears and Eyes appear Normal, Conjunctivae clear, PERRLA. Moist Oral Mucosa.  5. Supple Neck, No JVD, No cervical lymphadenopathy appriciated, No Carotid Bruits.  6. Symmetrical Chest wall movement, Good air movement bilaterally, CTAB.  7. RRR, No Gallops, Rubs or Murmurs, No Parasternal Heave.  8. Positive Bowel Sounds, Abdomen Soft, No tenderness, No organomegaly appriciated,No rebound -guarding or rigidity.  9.  No Cyanosis, Normal Skin Turgor, No Skin Rash or Bruise.  10. Good muscle tone,   joints appear normal , no effusions, Normal ROM.    Data Review:    CBC Recent Labs  Lab 11/20/22 1049  WBC 5.3  HGB 12.6*  HCT 38.9*  PLT 158  MCV 91.3  MCH 29.6  MCHC 32.4  RDW 14.0  LYMPHSABS 1.3  MONOABS 0.5  EOSABS 0.1  BASOSABS 0.1   ------------------------------------------------------------------------------------------------------------------  Chemistries  Recent Labs  Lab 11/20/22 1049  NA 138  K 4.5  CL 103  CO2 31  GLUCOSE 99  BUN 14  CREATININE 0.69  CALCIUM 8.5*  AST 29  ALT 8  ALKPHOS 87  BILITOT 0.6   ------------------------------------------------------------------------------------------------------------------ estimated creatinine clearance is 95.3 mL/min (by C-G formula based on SCr of 0.69 mg/dL). ------------------------------------------------------------------------------------------------------------------ Recent Labs    11/20/22 1049  TSH 4.369    Coagulation profile No results for input(s): "INR", "PROTIME" in the last 168 hours. ------------------------------------------------------------------------------------------------------------------- No results for input(s): "DDIMER" in the last 72 hours. -------------------------------------------------------------------------------------------------------------------  Cardiac Enzymes No results for input(s): "CKMB", "TROPONINI", "MYOGLOBIN" in the last 168 hours.  Invalid input(s): "CK" ------------------------------------------------------------------------------------------------------------------    Component Value Date/Time   BNP 262.5 (H) 11/20/2022 1049     ---------------------------------------------------------------------------------------------------------------  Urinalysis    Component Value Date/Time   COLORURINE YELLOW 11/20/2022 1049   APPEARANCEUR CLEAR 11/20/2022 1049   LABSPEC 1.015 11/20/2022 1049   PHURINE 6.5 11/20/2022 1049   GLUCOSEU 100 (A)  11/20/2022 1049   GLUCOSEU >=1000 (A) 03/16/2020 0958   HGBUR MODERATE (A) 11/20/2022 1049   BILIRUBINUR NEGATIVE 11/20/2022 1049   KETONESUR NEGATIVE 11/20/2022 1049   PROTEINUR >300 (A) 11/20/2022 1049   UROBILINOGEN 0.2 03/16/2020 0958   NITRITE NEGATIVE 11/20/2022 1049  LEUKOCYTESUR NEGATIVE 11/20/2022 1049    ----------------------------------------------------------------------------------------------------------------   Imaging Results:    CT Head Wo Contrast  Result Date: 11/20/2022 CLINICAL DATA:  Mental status change, unknown cause. Confusion. Shortness of breath. EXAM: CT HEAD WITHOUT CONTRAST TECHNIQUE: Contiguous axial images were obtained from the base of the skull through the vertex without intravenous contrast. RADIATION DOSE REDUCTION: This exam was performed according to the departmental dose-optimization program which includes automated exposure control, adjustment of the mA and/or kV according to patient size and/or use of iterative reconstruction technique. COMPARISON:  CT scan head from 01/03/2022. FINDINGS: Brain: No evidence of acute infarction, hemorrhage, hydrocephalus, extra-axial collection or mass lesion/mass effect. There is bilateral periventricular hypodensity, which is non-specific but most likely seen in the settings of microvascular ischemic changes. Mild in extent. Otherwise normal appearance of brain parenchyma. Ventricles are normal. Cerebral volume is age appropriate. Vascular: No hyperdense vessel or unexpected calcification. Intracranial arteriosclerosis. Skull: Normal. Negative for fracture or focal lesion. Sinuses/Orbits: No acute finding. Other: Visualized mastoid air cells are unremarkable. No mastoid effusion. IMPRESSION: *No acute intracranial abnormality. Electronically Signed   By: Jules Schick M.D.   On: 11/20/2022 11:30   DG Chest 2 View  Result Date: 11/20/2022 CLINICAL DATA:  Weakness. Elevated blood pressure. Shortness of breath. EXAM:  CHEST - 2 VIEW COMPARISON:  01/03/2022. FINDINGS: There is mild-to-moderate pulmonary vascular congestion. There is small-to-moderate right pleural effusion with probable associated compressive atelectatic changes in the right lung. There is probable small layering left pleural effusion as well. There is an approximately 1.5 x 2.0 cm nodular opacity abutting the left lateral hemidiaphragm, which is incompletely characterized on the current examination but favored to represent atelectasis. Attention on follow-up examination is recommended. Bilateral lung fields are otherwise clear. No pneumothorax. Stable cardio-mediastinal silhouette. There are surgical staples along the heart border and sternotomy wires, status post CABG (coronary artery bypass graft). No acute osseous abnormalities. The soft tissues are within normal limits. IMPRESSION: *Findings compatible with congestive heart failure/pulmonary edema, as described above. *There is a nodular 1.5 x 2.0 cm opacity overlying the left lower lung zone, favored to represent atelectasis. Attention on follow-up examination is recommended. Electronically Signed   By: Jules Schick M.D.   On: 11/20/2022 11:28    My personal review of EKG:  Vent. rate 44 BPM PR interval * ms QRS duration 94 ms QT/QTcB 540/462 ms P-R-T axes * 73 71 Atrial flutter Anteroseptal infarct, age indeterminate   Assessment & Plan:    Principal Problem:   Hypertensive emergency Active Problems:   Coronary artery disease   Benign prostatic hyperplasia without lower urinary tract symptoms   PAF (paroxysmal atrial fibrillation) (HCC)   Acute exacerbation of CHF (congestive heart failure) (HCC)   Diabetes mellitus (HCC)   Atrial fibrillation with slow ventricular response (HCC)   Hypertensive urgency -Presents without complaint controlled blood pressure, most likely due to noncompliance with his medication and diuretics -I will start on scheduled hydralazine every 6 hours as  can be uptitrated easily, will add as needed as needed IV hydralazine as well -Start on amlodipine 10 mg oral daily -No beta-blockers for now given known slow ventricular response and low heart rate, will try to avoid clonidine for same reason as well -Already received losartan, will continue  Acute on chronic diastolic CHF -Presents of volume overload on imaging, elevated BNP, most likely provoked due to above -Continue with IV Lasix 40 mg IV twice daily -Resume Aldactone -Recheck 2D echo  Diabetes mellitus,  type II, uncontrolled with hypoglycemia -Son report father had 2 episodes of hypoglycemia over the last couple weeks -Not sure at this point if his insulin pump will be the best option, it has already been discontinued while in ED, will place on insulin sliding scale, and likely will try to put on oral agents of possible, as it would be a safer option in this patient who cannot do his insulin pump or injectable insulin as he lives alone with frequent confusions  Confusion -As per son there has been more forgetful recently, but he is much confused today, will obtain B12, B1 and RPR -Acute confusion today, most likely in the setting of hypertensive urgency -Consult SLP for cognition evaluation   Paroxysmal atrial fibrillation slow ventricular response Continue Eliquis, not on any heart rate control medication given slow ventricular response   S/P CABG (coronary artery bypass graft) CAD, CABG in 2003 -Continue with aspirin and statin, follow-up on 2D echo   Pressure ulcer, POA -stage 1 on buttock and stage 1 on left heel thanks  -Continue with nurse driven pressure ulcer prevention protocol   Consult PT, OT, SLP, and TOC as likely will need some assistance at home  DVT Prophylaxis Eliquis  AM Labs Ordered, also please review Full Orders  Family Communication: Admission, patients condition and plan of care including tests being ordered have been discussed with the patient and  son by phone who indicate understanding and agree with the plan and Code Status.  Code Status Full  Likely DC to  home  Condition GUARDED    Consults called: none    Admission status: observation    Time spent in minutes : 70 minutes   Huey Bienenstock M.D on 11/20/2022 at 5:24 PM   [facility] - Office  [phone]

## 2022-11-20 NOTE — ED Provider Notes (Signed)
[place] EMERGENCY DEPARTMENT AT [facility] Provider Note   CSN: [id number] Arrival date & time: 11/20/22  0940     History  Chief Complaint  Patient presents with   Hypertension    [name] [name] is a 67 y.o. male.  Patient with past medical history of Type 1 DM with insulin pump, CAD with remote h/o CABG, HTN, PVD, PAF with slow ventricular response noted during hospitalization January 2024, anticoagulated on apixaban --presents to the emergency department today for evaluation of uncontrolled blood pressure and medication noncompliance.  Patient was seen by PCP this morning.  Recommended to be seen in the emergency department due to significantly elevated blood pressure.  Patient is accompanied by the patient's son, [name].  He raises concerns about the patient caring for himself at home.  He has had a decline over the past couple of months.  His blood sugars bottomed out a couple of times and patient was found passed out on the floor.  He was assisted by his son.  He notes that sometimes he is away on business and cannot care for him 24 hours a day.  They started the process today for home health evaluation.  Patient has had generalized confusion.  He has been poorly compliant with medications, taking sporadically at best.  Patient has a very flat affect but does not not voice any acute medical concerns.  He is obviously very poor historian.  No apparent injuries from any of the falls.  He is prescribed Lasix for some lower extremity edema, most previous echo January 2024 with normal EF.       Home Medications Prior to Admission medications   Medication Sig Start Date End Date Taking? Authorizing Provider  acetaminophen (TYLENOL) 500 MG tablet Take 500 mg by mouth as needed for mild pain or moderate pain.    [provider]  apixaban (ELIQUIS) 5 MG TABS tablet Take 1 tablet (5 mg total) by mouth 2 (two) times daily. 03/02/22   Sande Rives, MD   aspirin EC 81 MG EC tablet Take 1 tablet (81 mg total) by mouth daily. Patient taking differently: Take 81 mg by mouth in the morning. 02/18/21   Zannie Cove, MD  atorvastatin (LIPITOR) 40 MG tablet TAKE 1 TABLET BY MOUTH EVERY DAY 09/17/22   [name], [name], PA-C  Continuous Glucose Sensor (DEXCOM G7 SENSOR) MISC 1 Device by Does not apply route as directed. 11/13/22   Shamleffer, Konrad Dolores, MD  furosemide (LASIX) 40 MG tablet TAKE 1 TABLET BY MOUTH EVERY DAY 03/12/22   [name], [name], PA-C  Incontinence Supply Disposable (DISPOSABLE BRIEF LARGE) MISC 1 each by Does not apply route as needed. 02/24/21   [name], [name], PA-C  insulin aspart (NOVOLOG) 100 UNIT/ML injection Max daily 50 units 06/08/22   Shamleffer, Konrad Dolores, MD  Insulin Disposable Pump (OMNIPOD 5 G7 PODS, GEN 5,) MISC 1 Device by Does not apply route every other day. 11/13/22   Shamleffer, Konrad Dolores, MD  insulin glargine (LANTUS) 100 UNIT/ML injection Inject into the skin.    [provider]  loperamide (IMODIUM A-D) 2 MG tablet Take 1 tablet (2 mg total) by mouth 4 (four) times daily as needed for diarrhea or loose stools. Patient not taking: Reported on 11/20/2022 09/20/22   [name], [name], PA-C  loperamide (IMODIUM) 2 MG capsule Take 2 mg by mouth 4 (four) times daily as needed. 09/20/22   [provider]  losartan (COZAAR) 50 MG tablet  Take 1 tablet (50 mg total) by mouth daily. 01/06/22 04/06/22  Narda Bonds, MD  mirtazapine (REMERON) 15 MG tablet TAKE 1 TABLET BY MOUTH EVERYDAY AT BEDTIME 04/19/22   [name], [name], PA-C  mirtazapine (REMERON) 15 MG tablet Take by mouth. 12/15/19   [provider]  Multiple Vitamins-Minerals (MULTIVITAMIN WITH MINERALS) tablet Take 1 tablet by mouth daily.    [provider]  mupirocin ointment (BACTROBAN) 2 % Apply 1 Application topically daily. 06/05/22   McDonald, Rachelle Hora, DPM  PARoxetine (PAXIL) 20 MG tablet TAKE 1 TABLET BY  MOUTH EVERY DAY Patient taking differently: Take 20 mg by mouth in the morning. 10/02/21   [name], [name], PA-C  spironolactone (ALDACTONE) 25 MG tablet TAKE 1 TABLET BY MOUTH EVERY DAY 04/19/22   [name], [name], PA-C      Allergies    Wool alcohol [lanolin]    Review of Systems   Review of Systems  Physical Exam Updated Vital Signs BP (!) 233/102 (BP Location: Right Arm)   Pulse (!) 43   Temp 97.8 F (36.6 C) (Oral)   Resp 17   Wt 77.3 kg   SpO2 99%   BMI 21.88 kg/m  Physical Exam Vitals and nursing note reviewed.  Constitutional:      General: He is not in acute distress.    Appearance: He is well-developed.  HENT:     Head: Normocephalic and atraumatic.     Right Ear: External ear normal.     Left Ear: External ear normal.     Nose: Nose normal.     Mouth/Throat:     Mouth: Mucous membranes are moist.  Eyes:     General:        Right eye: No discharge.        Left eye: No discharge.     Conjunctiva/sclera: Conjunctivae normal.  Cardiovascular:     Rate and Rhythm: Bradycardia present. Rhythm irregular.     Heart sounds: Normal heart sounds.  Pulmonary:     Effort: Pulmonary effort is normal.     Breath sounds: Normal breath sounds.  Abdominal:     Palpations: Abdomen is soft.     Tenderness: There is no abdominal tenderness. There is no guarding or rebound.  Musculoskeletal:     Cervical back: Normal range of motion and neck supple.  Skin:    General: Skin is warm and dry.  Neurological:     General: No focal deficit present.     Mental Status: He is alert.  Psychiatric:        Mood and Affect: Mood is depressed. Affect is flat.        Speech: Speech is delayed.        Behavior: Behavior is cooperative.     ED Results / Procedures / Treatments   Labs (all labs ordered are listed, but only abnormal results are displayed) Labs Reviewed  CBC WITH DIFFERENTIAL/PLATELET - Abnormal; Notable for the following components:      Result Value    Hemoglobin 12.6 (*)    HCT 38.9 (*)    All other components within normal limits  COMPREHENSIVE METABOLIC PANEL - Abnormal; Notable for the following components:   Calcium 8.5 (*)    Total Protein 5.7 (*)    Albumin 2.9 (*)    Anion gap 4 (*)    All other components within normal limits  BRAIN NATRIURETIC PEPTIDE - Abnormal; Notable for the following components:   B  Natriuretic Peptide 262.5 (*)    All other components within normal limits  TSH  URINALYSIS, ROUTINE W REFLEX MICROSCOPIC  CBG MONITORING, ED    EKG EKG Interpretation Date/Time:  Tuesday November 20 2022 10:09:00 EST Ventricular Rate:  44 PR Interval:    QRS Duration:  94 QT Interval:  540 QTC Calculation: 462 R Axis:   73  Text Interpretation: Atrial flutter Anteroseptal infarct, age indeterminate Confirmed by Alvester Chou ([id number]) on 11/20/2022 10:33:47 AM  Radiology CT Head Wo Contrast  Result Date: 11/20/2022 CLINICAL DATA:  Mental status change, unknown cause. Confusion. Shortness of breath. EXAM: CT HEAD WITHOUT CONTRAST TECHNIQUE: Contiguous axial images were obtained from the base of the skull through the vertex without intravenous contrast. RADIATION DOSE REDUCTION: This exam was performed according to the departmental dose-optimization program which includes automated exposure control, adjustment of the mA and/or kV according to patient size and/or use of iterative reconstruction technique. COMPARISON:  CT scan head from 01/03/2022. FINDINGS: Brain: No evidence of acute infarction, hemorrhage, hydrocephalus, extra-axial collection or mass lesion/mass effect. There is bilateral periventricular hypodensity, which is non-specific but most likely seen in the settings of microvascular ischemic changes. Mild in extent. Otherwise normal appearance of brain parenchyma. Ventricles are normal. Cerebral volume is age appropriate. Vascular: No hyperdense vessel or unexpected calcification. Intracranial arteriosclerosis.  Skull: Normal. Negative for fracture or focal lesion. Sinuses/Orbits: No acute finding. Other: Visualized mastoid air cells are unremarkable. No mastoid effusion. IMPRESSION: *No acute intracranial abnormality. Electronically Signed   By: Jules Schick M.D.   On: 11/20/2022 11:30   DG Chest 2 View  Result Date: 11/20/2022 CLINICAL DATA:  Weakness. Elevated blood pressure. Shortness of breath. EXAM: CHEST - 2 VIEW COMPARISON:  01/03/2022. FINDINGS: There is mild-to-moderate pulmonary vascular congestion. There is small-to-moderate right pleural effusion with probable associated compressive atelectatic changes in the right lung. There is probable small layering left pleural effusion as well. There is an approximately 1.5 x 2.0 cm nodular opacity abutting the left lateral hemidiaphragm, which is incompletely characterized on the current examination but favored to represent atelectasis. Attention on follow-up examination is recommended. Bilateral lung fields are otherwise clear. No pneumothorax. Stable cardio-mediastinal silhouette. There are surgical staples along the heart border and sternotomy wires, status post CABG (coronary artery bypass graft). No acute osseous abnormalities. The soft tissues are within normal limits. IMPRESSION: *Findings compatible with congestive heart failure/pulmonary edema, as described above. *There is a nodular 1.5 x 2.0 cm opacity overlying the left lower lung zone, favored to represent atelectasis. Attention on follow-up examination is recommended. Electronically Signed   By: Jules Schick M.D.   On: 11/20/2022 11:28    Procedures Procedures    Medications Ordered in ED Medications  losartan (COZAAR) tablet 50 mg (50 mg Oral Given 11/20/22 1132)  furosemide (LASIX) injection 40 mg (has no administration in time range)  hydrALAZINE (APRESOLINE) injection 10 mg (has no administration in time range)  cloNIDine (CATAPRES) tablet 0.2 mg (0.2 mg Oral Given 11/20/22 1132)     ED Course/ Medical Decision Making/ A&P    Patient seen and examined. History obtained directly from patient and family member at bedside.  I reviewed the patient's PCP note from today.  I reviewed other notes including endocrinology, cardiology, admission notes from this year.  Labs/EKG: Ordered CBC, CMP, TSH, UA.  Imaging: Ordered CT head given cognitive decline and recent falls, chest x-ray due to generalized weakness of unclear etiology.  Medications/Fluids: Ordered: PO valsartan, PO clonidine.  Most recent vital signs reviewed and are as follows: BP (!) 233/102 (BP Location: Right Arm)   Pulse (!) 43   Temp 97.8 F (36.6 C) (Oral)   Resp 17   Wt 77.3 kg   SpO2 99%   BMI 21.88 kg/m   Initial impression: Uncontrolled hypertension, will evaluate for signs of endorgan damage, also social situation currently contributing to uncontrolled symptoms.  11:03 AM Spoke with case Doctor, general practice who will speak with family and assist in starting home health process.   11:20 AM Face-to-face order completed in EPIC.   12:46 PM Reassessment performed. Patient appears stable.  Labs personally reviewed and interpreted including: CBC with normal white blood cell count, hemoglobin mildly low at 12.6 otherwise unremarkable; CMP with normal kidney function and electrolytes, low protein; TSH elevated; BNP moderately elevated at 260.  Troponin ordered and pending.  Imaging personally visualized and interpreted including: Chest x-ray, agree signs of failure/pulmonary edema.  Reviewed pertinent lab work and imaging with patient at bedside. Questions answered.   Most current vital signs reviewed and are as follows: BP (!) 205/102   Pulse (!) 57   Temp 97.8 F (36.6 C) (Oral)   Resp 15   Wt 77.3 kg   SpO2 99%   BMI 21.88 kg/m   Plan: Blood pressure initially improved a bit 170 systolic but now back up to 205/102.  Patient has uncontrolled hypertension with some signs of heart failure.   Also a complex social situation and medication noncompliance which is contributing to very poor control of his chronic medical conditions.  Advise admission to patient and family and they are in agreement.  Patient has been discussed with Dr. Renaye Rakers.  Will give a dose of hydralazine as well as furosemide.  1:08 PM Discussed case with Dr. Katrinka Blazing with [facility] who accepts for admission.   CRITICAL CARE Performed by: Renne Crigler PA-C Total critical care time: 35 minutes Critical care time was exclusive of separately billable procedures and treating other patients. Critical care was necessary to treat or prevent imminent or life-threatening deterioration. Critical care was time spent personally by me on the following activities: development of treatment plan with patient and/or surrogate as well as nursing, discussions with consultants, evaluation of patient's response to treatment, examination of patient, obtaining history from patient or surrogate, ordering and performing treatments and interventions, ordering and review of laboratory studies, ordering and review of radiographic studies, pulse oximetry and re-evaluation of patient's condition.                                 Medical Decision Making Amount and/or Complexity of Data Reviewed Labs: ordered. Radiology: ordered.  Risk Prescription drug management. Decision regarding hospitalization.   Patient with evidence of heart failure on chest x-ray and with elevated BNP in setting of uncontrolled hypertension.  This is likely due to medication noncompliance.  Blood pressures continue to be poorly controlled on medication in the emergency department.  Plan for admission for hypertensive emergency with associated heart failure.         Final Clinical Impression(s) / ED Diagnoses Final diagnoses:  Hypertensive emergency  Acute congestive heart failure, unspecified heart failure type [facility])    Rx / DC Orders ED Discharge Orders           Ordered    Home Health        11/20/22 1120    Face-to-face encounter (required for Medicare/Medicaid  patients)       Comments: I Renne Crigler certify that this patient is under my care and that I, or a nurse practitioner or physician's assistant working with me, had a face-to-face encounter that meets the physician face-to-face encounter requirements with this patient on 11/20/2022. The encounter with the patient was in whole, or in part for the following medical condition(s) which is the primary reason for home health care (List medical condition): diabetes complications/frequent hypoglycemic episodes, uncontrolled hypertension, frequent falls, medication non-complicance   11/20/22 1120              Renne Crigler, PA-C 11/20/22 1310    Terald Sleeper, MD 11/20/22 [phone]

## 2022-11-20 NOTE — ED Notes (Signed)
[name] [name] with [facility] called for transport

## 2022-11-20 NOTE — ED Notes (Signed)
Pt left with [facility] heading to [facility]. VSS. Son took pt belongings home, including walker. Pt has cell phone with him.

## 2022-11-20 NOTE — ED Triage Notes (Addendum)
BIB son from home, sent from [facility] PCP, sent for "multiple things going on", including: SOB, confusion, pedal edema. Ambulated in with rolling walker/ rollator. Pt/ family are poor historians. Alert, NAD, calm, interactive, slow to answer, skin pink, W&D. Pt denies pain, HA, NVD, weakness, dizziness, visual changes.

## 2022-11-20 NOTE — Patient Instructions (Addendum)
IT IS STRONGLY RECOMMENDED THAT YOU PRESENT TO [facility] EMERGENC[facility] DEPARTMENT AT THIS TIME DUE TO YOUR SEVERELY ELEVATED BLOOD PRESSURE.    RISKS OF NOT PRESENTING TO THE EMERGENCY DEPARTMENT MAY INCLUDE BUT ARE NOT LIMITED TO: STROKE, HEART ATTACK, LIVER OR KIDNEY DAMAGE, ETC.   I have placed a referral for case management services. You should be contacted soon.  I strongly recommend continued PCP care with a physician going forward - please schedule with front office.

## 2022-11-21 ENCOUNTER — Telehealth: Payer: Self-pay | Admitting: *Deleted

## 2022-11-21 ENCOUNTER — Ambulatory Visit: Payer: PPO

## 2022-11-21 ENCOUNTER — Observation Stay (HOSPITAL_COMMUNITY): Payer: PPO

## 2022-11-21 VITALS — Wt 170.0 lb

## 2022-11-21 DIAGNOSIS — I63411 Cerebral infarction due to embolism of right middle cerebral artery: Secondary | ICD-10-CM | POA: Diagnosis not present

## 2022-11-21 DIAGNOSIS — E1142 Type 2 diabetes mellitus with diabetic polyneuropathy: Secondary | ICD-10-CM | POA: Diagnosis not present

## 2022-11-21 DIAGNOSIS — I38 Endocarditis, valve unspecified: Secondary | ICD-10-CM | POA: Diagnosis not present

## 2022-11-21 DIAGNOSIS — I639 Cerebral infarction, unspecified: Secondary | ICD-10-CM | POA: Diagnosis not present

## 2022-11-21 DIAGNOSIS — I4892 Unspecified atrial flutter: Secondary | ICD-10-CM | POA: Diagnosis not present

## 2022-11-21 DIAGNOSIS — I110 Hypertensive heart disease with heart failure: Secondary | ICD-10-CM | POA: Diagnosis not present

## 2022-11-21 DIAGNOSIS — Z7401 Bed confinement status: Secondary | ICD-10-CM | POA: Diagnosis not present

## 2022-11-21 DIAGNOSIS — Z794 Long term (current) use of insulin: Secondary | ICD-10-CM | POA: Diagnosis not present

## 2022-11-21 DIAGNOSIS — R4182 Altered mental status, unspecified: Secondary | ICD-10-CM | POA: Diagnosis not present

## 2022-11-21 DIAGNOSIS — Z87891 Personal history of nicotine dependence: Secondary | ICD-10-CM | POA: Diagnosis not present

## 2022-11-21 DIAGNOSIS — J9811 Atelectasis: Secondary | ICD-10-CM | POA: Diagnosis not present

## 2022-11-21 DIAGNOSIS — I160 Hypertensive urgency: Secondary | ICD-10-CM | POA: Diagnosis not present

## 2022-11-21 DIAGNOSIS — I480 Paroxysmal atrial fibrillation: Secondary | ICD-10-CM | POA: Diagnosis not present

## 2022-11-21 DIAGNOSIS — M6281 Muscle weakness (generalized): Secondary | ICD-10-CM | POA: Diagnosis not present

## 2022-11-21 DIAGNOSIS — L89621 Pressure ulcer of left heel, stage 1: Secondary | ICD-10-CM | POA: Diagnosis not present

## 2022-11-21 DIAGNOSIS — I2510 Atherosclerotic heart disease of native coronary artery without angina pectoris: Secondary | ICD-10-CM | POA: Diagnosis not present

## 2022-11-21 DIAGNOSIS — J811 Chronic pulmonary edema: Secondary | ICD-10-CM | POA: Diagnosis not present

## 2022-11-21 DIAGNOSIS — E860 Dehydration: Secondary | ICD-10-CM | POA: Diagnosis not present

## 2022-11-21 DIAGNOSIS — N179 Acute kidney failure, unspecified: Secondary | ICD-10-CM | POA: Diagnosis not present

## 2022-11-21 DIAGNOSIS — I5032 Chronic diastolic (congestive) heart failure: Secondary | ICD-10-CM | POA: Diagnosis not present

## 2022-11-21 DIAGNOSIS — R7881 Bacteremia: Secondary | ICD-10-CM | POA: Diagnosis not present

## 2022-11-21 DIAGNOSIS — Z789 Other specified health status: Secondary | ICD-10-CM | POA: Diagnosis not present

## 2022-11-21 DIAGNOSIS — R443 Hallucinations, unspecified: Secondary | ICD-10-CM | POA: Diagnosis not present

## 2022-11-21 DIAGNOSIS — I7143 Infrarenal abdominal aortic aneurysm, without rupture: Secondary | ICD-10-CM | POA: Diagnosis not present

## 2022-11-21 DIAGNOSIS — R451 Restlessness and agitation: Secondary | ICD-10-CM | POA: Diagnosis not present

## 2022-11-21 DIAGNOSIS — I723 Aneurysm of iliac artery: Secondary | ICD-10-CM | POA: Diagnosis not present

## 2022-11-21 DIAGNOSIS — Z7901 Long term (current) use of anticoagulants: Secondary | ICD-10-CM | POA: Diagnosis not present

## 2022-11-21 DIAGNOSIS — R531 Weakness: Secondary | ICD-10-CM | POA: Diagnosis not present

## 2022-11-21 DIAGNOSIS — E1022 Type 1 diabetes mellitus with diabetic chronic kidney disease: Secondary | ICD-10-CM | POA: Diagnosis not present

## 2022-11-21 DIAGNOSIS — R1110 Vomiting, unspecified: Secondary | ICD-10-CM | POA: Diagnosis not present

## 2022-11-21 DIAGNOSIS — E785 Hyperlipidemia, unspecified: Secondary | ICD-10-CM | POA: Diagnosis not present

## 2022-11-21 DIAGNOSIS — J90 Pleural effusion, not elsewhere classified: Secondary | ICD-10-CM | POA: Diagnosis not present

## 2022-11-21 DIAGNOSIS — E10319 Type 1 diabetes mellitus with unspecified diabetic retinopathy without macular edema: Secondary | ICD-10-CM | POA: Diagnosis not present

## 2022-11-21 DIAGNOSIS — L89311 Pressure ulcer of right buttock, stage 1: Secondary | ICD-10-CM | POA: Diagnosis not present

## 2022-11-21 DIAGNOSIS — L89321 Pressure ulcer of left buttock, stage 1: Secondary | ICD-10-CM | POA: Diagnosis not present

## 2022-11-21 DIAGNOSIS — R41841 Cognitive communication deficit: Secondary | ICD-10-CM | POA: Diagnosis not present

## 2022-11-21 DIAGNOSIS — Z781 Physical restraint status: Secondary | ICD-10-CM | POA: Diagnosis not present

## 2022-11-21 DIAGNOSIS — I700 Atherosclerosis of aorta: Secondary | ICD-10-CM | POA: Diagnosis not present

## 2022-11-21 DIAGNOSIS — R278 Other lack of coordination: Secondary | ICD-10-CM | POA: Diagnosis not present

## 2022-11-21 DIAGNOSIS — N1831 Chronic kidney disease, stage 3a: Secondary | ICD-10-CM | POA: Diagnosis not present

## 2022-11-21 DIAGNOSIS — E1165 Type 2 diabetes mellitus with hyperglycemia: Secondary | ICD-10-CM | POA: Diagnosis not present

## 2022-11-21 DIAGNOSIS — I69319 Unspecified symptoms and signs involving cognitive functions following cerebral infarction: Secondary | ICD-10-CM | POA: Diagnosis not present

## 2022-11-21 DIAGNOSIS — Z0000 Encounter for general adult medical examination without abnormal findings: Secondary | ICD-10-CM | POA: Diagnosis not present

## 2022-11-21 DIAGNOSIS — G319 Degenerative disease of nervous system, unspecified: Secondary | ICD-10-CM | POA: Diagnosis not present

## 2022-11-21 DIAGNOSIS — Y92009 Unspecified place in unspecified non-institutional (private) residence as the place of occurrence of the external cause: Secondary | ICD-10-CM | POA: Diagnosis not present

## 2022-11-21 DIAGNOSIS — I6523 Occlusion and stenosis of bilateral carotid arteries: Secondary | ICD-10-CM | POA: Diagnosis not present

## 2022-11-21 DIAGNOSIS — I509 Heart failure, unspecified: Secondary | ICD-10-CM | POA: Diagnosis not present

## 2022-11-21 DIAGNOSIS — E10649 Type 1 diabetes mellitus with hypoglycemia without coma: Secondary | ICD-10-CM | POA: Diagnosis not present

## 2022-11-21 DIAGNOSIS — I130 Hypertensive heart and chronic kidney disease with heart failure and stage 1 through stage 4 chronic kidney disease, or unspecified chronic kidney disease: Secondary | ICD-10-CM | POA: Diagnosis not present

## 2022-11-21 DIAGNOSIS — F32A Depression, unspecified: Secondary | ICD-10-CM | POA: Diagnosis not present

## 2022-11-21 DIAGNOSIS — E1051 Type 1 diabetes mellitus with diabetic peripheral angiopathy without gangrene: Secondary | ICD-10-CM | POA: Diagnosis not present

## 2022-11-21 DIAGNOSIS — Z951 Presence of aortocoronary bypass graft: Secondary | ICD-10-CM | POA: Diagnosis not present

## 2022-11-21 DIAGNOSIS — E43 Unspecified severe protein-calorie malnutrition: Secondary | ICD-10-CM | POA: Diagnosis not present

## 2022-11-21 DIAGNOSIS — I5033 Acute on chronic diastolic (congestive) heart failure: Secondary | ICD-10-CM | POA: Diagnosis not present

## 2022-11-21 DIAGNOSIS — I161 Hypertensive emergency: Secondary | ICD-10-CM | POA: Diagnosis not present

## 2022-11-21 LAB — ECHOCARDIOGRAM COMPLETE
AR max vel: 1.7 cm2
AV Area VTI: 1.95 cm2
AV Area mean vel: 1.86 cm2
AV Mean grad: 3 mm[Hg]
AV Peak grad: 5 mm[Hg]
Ao pk vel: 1.12 m/s
Area-P 1/2: 2.55 cm2
Height: 74 in
S' Lateral: 2.1 cm
Weight: 2720 [oz_av]

## 2022-11-21 LAB — CBC WITH DIFFERENTIAL/PLATELET
Abs Immature Granulocytes: 0.02 10*3/uL (ref 0.00–0.07)
Basophils Absolute: 0 10*3/uL (ref 0.0–0.1)
Basophils Relative: 1 %
Eosinophils Absolute: 0.1 10*3/uL (ref 0.0–0.5)
Eosinophils Relative: 2 %
HCT: 32.3 % — ABNORMAL LOW (ref 39.0–52.0)
Hemoglobin: 10.8 g/dL — ABNORMAL LOW (ref 13.0–17.0)
Immature Granulocytes: 0 %
Lymphocytes Relative: 26 %
Lymphs Abs: 1.3 10*3/uL (ref 0.7–4.0)
MCH: 29.9 pg (ref 26.0–34.0)
MCHC: 33.4 g/dL (ref 30.0–36.0)
MCV: 89.5 fL (ref 80.0–100.0)
Monocytes Absolute: 0.5 10*3/uL (ref 0.1–1.0)
Monocytes Relative: 10 %
Neutro Abs: 3.1 10*3/uL (ref 1.7–7.7)
Neutrophils Relative %: 61 %
Platelets: 149 10*3/uL — ABNORMAL LOW (ref 150–400)
RBC: 3.61 MIL/uL — ABNORMAL LOW (ref 4.22–5.81)
RDW: 14 % (ref 11.5–15.5)
WBC: 5.1 10*3/uL (ref 4.0–10.5)
nRBC: 0 % (ref 0.0–0.2)

## 2022-11-21 LAB — HIV ANTIBODY (ROUTINE TESTING W REFLEX): HIV Screen 4th Generation wRfx: NONREACTIVE

## 2022-11-21 LAB — GLUCOSE, CAPILLARY
Glucose-Capillary: 210 mg/dL — ABNORMAL HIGH (ref 70–99)
Glucose-Capillary: 192 mg/dL — ABNORMAL HIGH (ref 70–99)
Glucose-Capillary: 228 mg/dL — ABNORMAL HIGH (ref 70–99)
Glucose-Capillary: 200 mg/dL — ABNORMAL HIGH (ref 70–99)

## 2022-11-21 LAB — BASIC METABOLIC PANEL
Anion gap: 8 (ref 5–15)
BUN: 14 mg/dL (ref 8–23)
CO2: 27 mmol/L (ref 22–32)
Calcium: 7.9 mg/dL — ABNORMAL LOW (ref 8.9–10.3)
Chloride: 102 mmol/L (ref 98–111)
Creatinine, Ser: 1.06 mg/dL (ref 0.61–1.24)
GFR, Estimated: >60 mL/min (ref >60)
Glucose, Bld: 217 mg/dL — ABNORMAL HIGH (ref 70–99)
Potassium: 3.6 mmol/L (ref 3.5–5.1)
Sodium: 137 mmol/L (ref 135–145)

## 2022-11-21 LAB — VITAMIN B12: Vitamin B-12: 308 pg/mL (ref 180–914)

## 2022-11-21 LAB — PHOSPHORUS: Phosphorus: 4.2 mg/dL (ref 2.5–4.6)

## 2022-11-21 LAB — RPR: RPR Ser Ql: NONREACTIVE

## 2022-11-21 LAB — MAGNESIUM: Magnesium: 1.5 mg/dL — ABNORMAL LOW (ref 1.7–2.4)

## 2022-11-21 MED ORDER — INSULIN GLARGINE-YFGN 100 UNIT/ML ~~LOC~~ SOLN
8.0000 [IU] | Freq: Every day | SUBCUTANEOUS | Status: DC
  Administered 2022-11-21 – 2022-11-23 (×3): 8 [IU] via SUBCUTANEOUS
  Filled 2022-11-21 (×4): qty 0.08

## 2022-11-21 MED ORDER — MAGNESIUM SULFATE 2 GM/50ML IV SOLN
2.0000 g | Freq: Once | INTRAVENOUS | Status: AC
  Administered 2022-11-21: 2 g via INTRAVENOUS
  Filled 2022-11-21: qty 50

## 2022-11-21 MED ORDER — ENSURE ENLIVE PO LIQD
237.0000 mL | Freq: Two times a day (BID) | ORAL | Status: DC
  Administered 2022-11-22 – 2022-11-30 (×10): 237 mL via ORAL

## 2022-11-21 MED ORDER — POTASSIUM CHLORIDE CRYS ER 20 MEQ PO TBCR
40.0000 meq | EXTENDED_RELEASE_TABLET | Freq: Four times a day (QID) | ORAL | Status: AC
  Administered 2022-11-21 (×2): 40 meq via ORAL
  Filled 2022-11-21 (×2): qty 2

## 2022-11-21 MED ORDER — INSULIN GLARGINE-YFGN 100 UNIT/ML ~~LOC~~ SOLN: 10.0000 [IU] | Freq: Every day | SUBCUTANEOUS | Status: DC

## 2022-11-21 MED ORDER — ADULT MULTIVITAMIN W/MINERALS CH
1.0000 | ORAL_TABLET | Freq: Every day | ORAL | Status: DC
  Administered 2022-11-21 – 2022-11-30 (×9): 1 via ORAL
  Filled 2022-11-21 (×9): qty 1

## 2022-11-21 NOTE — Progress Notes (Signed)
Initial Nutrition Assessment  DOCUMENTATION CODES:   Severe malnutrition in context of social or environmental circumstances  INTERVENTION:  Liberalize diet to regular to promote adequate PO intake in the setting of severe malnutrition Ensure Enlive po BID, each supplement provides 350 kcal and 20 grams of protein. MVI with minerals daily  NUTRITION DIAGNOSIS:   Severe Malnutrition related to social / environmental circumstances as evidenced by severe fat depletion, severe muscle depletion.  GOAL:   Patient will meet greater than or equal to 90% of their needs  MONITOR:   PO intake, Supplement acceptance, Labs, Weight trends, Skin  REASON FOR ASSESSMENT:   Malnutrition Screening Tool    ASSESSMENT:   Pt admitted with hypertensive emergency. PMH significant for T2DM on insulin pump x3 years, CAD with h/o CABG, HTN, PVD, paroxysmal afib, HLD.   Pt noted to live alone, pt's son reported 2 episodes of hypoglycemia where he was found on the floor over the last 2 weeks d/t poor oral intake. Also noted more frequent forgetfulness recently.   Met with pt at bedside, no family present to assist with nutrition history. Per flowsheet documentation, pt is oriented x3 though forgetful. Unable to elicit nutrition related history. Attempted to ask questions related to dietary recall and nutritional intake however pt focused on talking about his jimmy dean breakfast bowls that he really enjoys.   Meal completions: 11/19: 15% dinner 11/20: 100% breakfast  Per review of weight history, no true weight trends to assess. Over the last year, pt's weight appears to have fluctuated between ~74-77 kg. Current weight: 74.7 kg  Medications: lasix 40mg  BID, SI 0-5 units at bedtime, SSI 0-9 units TID, remeron, klor-con, thiamine  Labs: Mg 1.5, CBG's 89-234 x24 hours, HgbA1c 9.8% (11/12)  NUTRITION - FOCUSED PHYSICAL EXAM:  Flowsheet Row Most Recent Value  Orbital Region Moderate depletion  Upper  Arm Region Severe depletion  Thoracic and Lumbar Region Severe depletion  Buccal Region Severe depletion  Temple Region Severe depletion  Clavicle Bone Region Severe depletion  Clavicle and Acromion Bone Region Severe depletion  Scapular Bone Region Severe depletion  Dorsal Hand Severe depletion  Patellar Region Severe depletion  Anterior Thigh Region Severe depletion  Posterior Calf Region Severe depletion  Edema (RD Assessment) None  Hair Reviewed  Eyes Reviewed  Mouth Reviewed  Skin Reviewed  Nails Unable to assess  [wearing gloves]       Diet Order:   Diet Order             Diet heart healthy/carb modified Room service appropriate? Yes; Fluid consistency: Thin  Diet effective now                   EDUCATION NEEDS:   Not appropriate for education at this time  Skin:  Skin Assessment: Skin Integrity Issues: Skin Integrity Issues:: Stage I Stage I: L heel  Last BM:  unknown/PTA  Height:   Ht Readings from Last 1 Encounters:  11/20/22 6\' 2"  (1.88 m)    Weight:   Wt Readings from Last 1 Encounters:  11/21/22 74.7 kg   BMI:  Body mass index is 21.14 kg/m.  Estimated Nutritional Needs:   Kcal:  2100-2300  Protein:  105-120g  Fluid:  >/=2L  Drusilla Kanner, RDN, LDN Clinical Nutrition

## 2022-11-21 NOTE — TOC Initial Note (Addendum)
Transition of Care [facility]) - Initial/Assessment Note    Patient Details  Name: [name] [name] MRN: [id number] Date of Birth: July 28, 1955  Transition of Care [facility]) CM/SW Contact:    Leone Haven, RN Phone Number: 11/21/2022, 2:21 PM  Clinical Narrative:                 From home alone, has PCP and insurance on file, states is set up with [facility]  for [facility], HHPT, and SW .  Per previous NCM note Lynette with [facility] is able to take referral. He has no  DME at home.  States family member will transport him  home at [employer] and family is support system, states [facility] [address].  Pta self ambulatory. Will need HHRN, HHPT, SW orders.  Awaiting PT eval.  Expected Discharge Plan: Home w Home Health Services Barriers to Discharge: Continued Medical Work up   Patient Goals and CMS Choice Patient states their goals for this hospitalization and ongoing recovery are:: return home          Expected Discharge Plan and Services In-house Referral: NA Discharge Planning Services: CM Consult   Living arrangements for the past 2 months: Single Family Home                 DME Arranged: N/A         HH Arranged: RN, PT, Social Work HH Agency: [facility] Date HH Agency Contacted: 11/21/22 Time HH Agency Contacted: 1420 Representative spoke with at [facility] Agency: Haywood Lasso  Prior Living Arrangements/Services Living arrangements for the past 2 months: Single Family Home Lives with:: Self Patient language and need for interpreter reviewed:: Yes Do you feel safe going back to the place where you live?: Yes      Need for Family Participation in Patient Care: Yes (Comment) Care giver support system in place?: Yes (comment)   Criminal Activity/Legal Involvement Pertinent to Current Situation/Hospitalization: No - Comment as needed  Activities of Daily Living      Permission Sought/Granted Permission sought to share information with : Case Manager                 Emotional Assessment Appearance:: Appears stated age Attitude/Demeanor/Rapport: Engaged Affect (typically observed): Accepting Orientation: : Oriented to Self, Oriented to Place, Oriented to  Time, Oriented to Situation   Psych Involvement: No (comment)  Admission diagnosis:  Hypertensive emergency [I16.1] Acute congestive heart failure, unspecified heart failure type [facility]) [I50.9] Patient Active Problem List   Diagnosis Date Noted   Noncompliance 11/20/2022   Requires continuous supervision for activities of daily living (ADL) 11/20/2022   Hypertensive emergency 11/20/2022   Pre-ulcerative calluses 03/05/2022   Protein-calorie malnutrition, severe 01/06/2022   UTI (urinary tract infection) 01/05/2022   Acute urinary retention 01/04/2022   Chronic diastolic CHF (congestive heart failure) (HCC) 01/04/2022   Atrial fibrillation with slow ventricular response (HCC) 01/03/2022   Hallucinations 01/03/2022   Hematuria 01/03/2022   Type 2 diabetes mellitus with proliferative retinopathy of both eyes, with long-term current use of insulin (HCC) 08/18/2021   Type 2 diabetes mellitus with diabetic polyneuropathy, with long-term current use of insulin (HCC) 08/18/2021   Diabetes mellitus (HCC) 08/18/2021   Postherpetic neuralgia 08/11/2021   Blood clotting disorder (HCC) 07/12/2021   Type 1 diabetes mellitus (HCC) 02/16/2021   Chronic diarrhea 02/16/2021   Diarrhea 02/15/2021   Sinus bradycardia 02/15/2021   Acute exacerbation of CHF (congestive heart failure) (HCC) 02/14/2021   Iron deficiency  anemia secondary to inadequate dietary iron intake 07/14/2020   Diabetic ulcer of left foot associated with type 1 diabetes mellitus (HCC) 06/30/2020   Pressure injury of skin 05/24/2020   BPH with obstruction/lower urinary tract symptoms 03/16/2020   PAF (paroxysmal atrial fibrillation) (HCC) 03/16/2020   Type 1 diabetes mellitus with diabetic foot infection (HCC) 01/27/2020    Normocytic anemia 01/13/2020   Encounter for general adult medical examination with abnormal findings 01/13/2020   Hyperlipidemia LDL goal <70 01/13/2020   Benign prostatic hyperplasia without lower urinary tract symptoms 01/13/2020   PVD (peripheral vascular disease) (HCC) 01/13/2020   Coronary artery disease involving bypass graft of transplanted heart without angina pectoris 01/13/2020   Paroxysmal atrial fibrillation (HCC) 01/13/2020   Recurrent major depressive disorder, in partial remission (HCC) 01/13/2020   Major depression, recurrent (HCC) 12/09/2019   Tear of lateral meniscus of knee 10/05/2019   Aortic dissection (HCC) 02/05/2019   Coronary artery disease 02/05/2019   Hypertension 02/05/2019   Psoriasis 02/05/2019   Vitamin D deficiency 01/07/2019   Uncontrolled type 2 diabetes mellitus with hyperglycemia (HCC) 01/07/2019   Microalbuminuria 01/07/2019   Depression 06/10/2018   Patellar tendonitis of right knee 04/24/2018   Peyronie's disease 06/09/2015   Erectile dysfunction 03/15/2015   S/P aortic aneurysm repair 05/19/2012   S/P CABG (coronary artery bypass graft) 05/19/2012   Non-proliferative diabetic retinopathy (HCC) 02/27/2011   Contracture of hand joint 11/01/2010   PCP:  Bary Leriche, PA-C [id number] [place], [place] - [address] [place] [address] Phone: [phone] Fax: [phone]     Social Determinants of Health (SDOH) Social History: SDOH Screenings   Food Insecurity: Food Insecurity Present (11/21/2022)  Housing: Low Risk  (11/21/2022)  Transportation Needs: Unmet Transportation Needs (11/21/2022)  Utilities: Not At Risk (11/21/2022)  Alcohol Screen: Low Risk  (10/23/2019)  Depression (PHQ2-9): High Risk (11/21/2022)  Financial Resource Strain: Low Risk  (11/21/2022)  Physical Activity: Inactive (11/21/2022)  Social Connections: Socially Isolated (11/21/2022)  Stress: Stress  Concern Present (11/21/2022)  Tobacco Use: Medium Risk (11/21/2022)  Health Literacy: Adequate Health Literacy (11/21/2022)   SDOH Interventions:     Readmission Risk Interventions     No data to display

## 2022-11-21 NOTE — Progress Notes (Signed)
  Care Coordination   Note   11/21/2022 Name: [name] [name] MRN: [id number] DOB: 07-02-1955  [name] [name] is a 67 y.o. year old male who sees [name], [name], PA-C for primary care. I reached out to [employer] by phone today to offer care coordination services.  [name] was given information about Care Coordination services today including:   The Care Coordination services include support from the care team which includes your Nurse Coordinator, Clinical Social Worker, or Pharmacist.  The Care Coordination team is here to help remove barriers to the health concerns and goals most important to you. Care Coordination services are voluntary, and the patient may decline or stop services at any time by request to their care team member.   Care Coordination Consent Status: Patient agreed to services and verbal consent obtained.   Follow up plan:  Telephone appointment with care coordination team member scheduled for:  11/22/2022  Encounter Outcome:  Patient Scheduled from referral   Burman Nieves, [facility] Care Coordination Care Guide Direct Dial: [phone]

## 2022-11-21 NOTE — Plan of Care (Signed)
  Problem: Pain Management: Goal: General experience of comfort will improve Outcome: Completed/Met

## 2022-11-21 NOTE — Progress Notes (Signed)
Subjective:   [name] [name] is a 67 y.o. male who presents for Medicare Annual/Subsequent preventive examination.Compltedby son [name] [place]   Visit Complete: Virtual I connected with  [name] [name] on 11/21/22 by a audio enabled telemedicine application and verified that I am speaking with the correct person using two identifiers.  Patient Location: Home  Provider Location: Home Office  I discussed the limitations of evaluation and management by telemedicine. The patient expressed understanding and agreed to proceed.  Vital Signs: Because this visit was a virtual/telehealth visit, some criteria may be missing or patient reported. Any vitals not documented were not able to be obtained and vitals that have been documented are patient reported.  Cardiac Risk Factors include: advanced age (>42men, >58 women);dyslipidemia;diabetes mellitus;hypertension;male gender     Objective:    Today's Vitals   11/21/22 1329  Weight: 170 lb (77.1 kg)   Body mass index is 21.83 kg/m.     11/21/2022    1:36 PM 11/20/2022   10:06 AM 01/03/2022   12:22 PM 01/03/2022   12:18 PM 10/23/2021    9:15 AM 07/17/2021    9:40 PM 02/14/2021    8:14 PM  Advanced Directives  Does Patient Have a Medical Advance Directive? Yes No No No No No No  Type of Estate agent of [place];Living will        Copy of Healthcare Power of Attorney in Chart? No - copy requested        Would patient like information on creating a medical advance directive?   No - Patient declined No - Patient declined No - Patient declined No - Patient declined No - Patient declined    Current Medications (verified) Facility-Administered Encounter Medications as of 11/21/2022  Medication   acetaminophen (TYLENOL) tablet 650 mg   amLODipine (NORVASC) tablet 10 mg   apixaban (ELIQUIS) tablet 5 mg   aspirin EC tablet 81 mg   atorvastatin (LIPITOR) tablet 40 mg   feeding supplement (ENSURE ENLIVE / ENSURE  PLUS) liquid 237 mL   furosemide (LASIX) injection 40 mg   hydrALAZINE (APRESOLINE) injection 10 mg   hydrALAZINE (APRESOLINE) tablet 50 mg   insulin aspart (novoLOG) injection 0-5 Units   insulin aspart (novoLOG) injection 0-9 Units   losartan (COZAAR) tablet 50 mg   mirtazapine (REMERON) tablet 15 mg   multivitamin with minerals tablet 1 tablet   ondansetron (ZOFRAN) injection 4 mg   PARoxetine (PAXIL) tablet 20 mg   sodium chloride flush (NS) 0.9 % injection 3 mL   sodium chloride flush (NS) 0.9 % injection 3 mL   spironolactone (ALDACTONE) tablet 25 mg   thiamine (VITAMIN B1) tablet 200 mg   Outpatient Encounter Medications as of 11/21/2022  Medication Sig   acetaminophen (TYLENOL) 500 MG tablet Take 500 mg by mouth as needed for mild pain or moderate pain.   apixaban (ELIQUIS) 5 MG TABS tablet Take 1 tablet (5 mg total) by mouth 2 (two) times daily.   aspirin EC 81 MG EC tablet Take 1 tablet (81 mg total) by mouth daily. (Patient taking differently: Take 81 mg by mouth in the morning.)   atorvastatin (LIPITOR) 40 MG tablet TAKE 1 TABLET BY MOUTH EVERY DAY   Continuous Glucose Sensor (DEXCOM G7 SENSOR) MISC 1 Device by Does not apply route as directed.   furosemide (LASIX) 40 MG tablet TAKE 1 TABLET BY MOUTH EVERY DAY   Incontinence Supply Disposable (DISPOSABLE BRIEF LARGE) MISC 1 each by Does not  apply route as needed.   insulin aspart (NOVOLOG) 100 UNIT/ML injection Max daily 50 units   Insulin Disposable Pump (OMNIPOD 5 G7 PODS, GEN 5,) MISC 1 Device by Does not apply route every other day.   loperamide (IMODIUM) 2 MG capsule Take 2 mg by mouth 4 (four) times daily as needed for diarrhea or loose stools.   mirtazapine (REMERON) 15 MG tablet TAKE 1 TABLET BY MOUTH EVERYDAY AT BEDTIME   Multiple Vitamins-Minerals (MULTIVITAMIN WITH MINERALS) tablet Take 1 tablet by mouth daily.   PARoxetine (PAXIL) 20 MG tablet TAKE 1 TABLET BY MOUTH EVERY DAY (Patient taking differently: Take 20  mg by mouth in the morning.)   spironolactone (ALDACTONE) 25 MG tablet TAKE 1 TABLET BY MOUTH EVERY DAY   losartan (COZAAR) 50 MG tablet Take 1 tablet (50 mg total) by mouth daily.    Allergies (verified) Wool alcohol [lanolin]   History: Past Medical History:  Diagnosis Date   Anxiety    Arthritis    Cataract    CHF (congestive heart failure) (HCC)    Constipation    pt states goes EOD- hard stools    Coronary artery disease    Depression    Diabetes mellitus without complication (HCC)    Diabetic retinopathy (HCC)    DKA (diabetic ketoacidoses)    Hyperlipidemia    Hypertension    off meds   Hypertensive retinopathy    Non-intractable vomiting without nausea 06/10/2018   Postherpetic neuralgia 08/11/2021   Speculative diagnosis   Psoriasis    Tubular adenoma of colon 03/2019   Past Surgical History:  Procedure Laterality Date   ABDOMINAL AORTOGRAM W/LOWER EXTREMITY N/A 08/30/2020   Procedure: ABDOMINAL AORTOGRAM W/LOWER EXTREMITY;  Surgeon: Nada Libman, MD;  Location: [facility];  Service: Cardiovascular;  Laterality: N/A;   ABDOMINAL AORTOGRAM W/LOWER EXTREMITY N/A 11/15/2020   Procedure: ABDOMINAL AORTOGRAM W/LOWER EXTREMITY;  Surgeon: Nada Libman, MD;  Location: [facility];  Service: Cardiovascular;  Laterality: N/A;   ANKLE SURGERY     right   CARDIAC CATHETERIZATION  09/25/2001   COLONOSCOPY     ~10 yr ago- normal  per pt    CORONARY ARTERY BYPASS GRAFT  09/30/2001   CABG x 4   IR THORACENTESIS ASP PLEURAL SPACE W/IMG GUIDE  05/19/2020   LAPAROSCOPIC INGUINAL HERNIA REPAIR Bilateral 02/09/2010   MASS EXCISION Left 03/12/2013   Procedure: LEFT INDEX EXCISION MASS AND DEBRIDMENT DISTAL INTERPHALANGEAL JOINT;  Surgeon: Tami Ribas, MD;  Location: [facility];  Service: Orthopedics;  Laterality: Left;   PERIPHERAL VASCULAR BALLOON ANGIOPLASTY Left 08/30/2020   Procedure: PERIPHERAL VASCULAR BALLOON ANGIOPLASTY;  Surgeon: Nada Libman, MD;  Location: [facility];  Service: Cardiovascular;  Laterality: Left;  Peroneal   Family History  Problem Relation Age of Onset   Lymphoma Father    Cancer Paternal Grandmother        Colon Cancer   Colon cancer Paternal Grandmother    Colon polyps Neg Hx    Esophageal cancer Neg Hx    Rectal cancer Neg Hx    Stomach cancer Neg Hx    Diabetes Mellitus I Neg Hx    Pancreatic cancer Neg Hx    Social History   Socioeconomic History   Marital status: Divorced    Spouse name: Not on file   Number of children: 4   Years of education: 12   Highest education level: 12th grade  Occupational  History   Occupation: Retired  Tobacco Use   Smoking status: Former    Types: Cigars    Quit date: 2022    Years since quitting: 2.8   Smokeless tobacco: Never  Vaping Use   Vaping status: Never Used  Substance and Sexual Activity   Alcohol use: Yes    Comment: about once a year   Drug use: No   Sexual activity: Yes  Other Topics Concern   Not on file  Social History Narrative   Lives with his adult son [name].   Has 4 children, 2 sons- both live in [place], 2 daughters one lives in [place], [place] and youngest daughter attends [employer].    Fiance lives in  [place], [place]   Social Determinants of Health   Financial Resource Strain: Low Risk  (11/21/2022)   Overall Financial Resource Strain (CARDIA)    Difficulty of Paying Living Expenses: Not very hard  Food Insecurity: Food Insecurity Present (11/21/2022)   Hunger Vital Sign    Worried About Running Out of Food in the Last Year: Sometimes true    Ran Out of Food in the Last Year: Sometimes true  Transportation Needs: Unmet Transportation Needs (11/21/2022)   PRAPARE - Administrator, Civil Service (Medical): Yes    Lack of Transportation (Non-Medical): Yes  Physical Activity: Inactive (11/21/2022)   Exercise Vital Sign    Days of Exercise per Week: 0 days    Minutes of Exercise  per Session: 0 min  Stress: Stress Concern Present (11/21/2022)   [employer] of Occupational Health - Occupational Stress Questionnaire    Feeling of Stress : Very much  Social Connections: Socially Isolated (11/21/2022)   Social Connection and Isolation Panel [NHANES]    Frequency of Communication with Friends and Family: Once a week    Frequency of Social Gatherings with Friends and Family: Once a week    Attends Religious Services: Never    Database administrator or Organizations: No    Attends Engineer, structural: Never    Marital Status: Divorced    Tobacco Counseling Counseling given: Not Answered   Clinical Intake:  Pre-visit preparation completed: Yes  Pain : No/denies pain     BMI - recorded: 21.83 Nutritional Status: BMI of 19-24  Normal Diabetes: Yes CBG done?: No Did pt. bring in CBG monitor from home?: No  How often do you need to have someone help you when you read instructions, pamphlets, or other written materials from your doctor or pharmacy?: 1 - Never  Interpreter Needed?: No  Information entered by :: Lanier Ensign, LPN   Activities of Daily Living    11/21/2022    1:32 PM 01/04/2022   10:00 AM  In your present state of health, do you have any difficulty performing the following activities:  Hearing? 1   Comment HOH   Vision? 0   Difficulty concentrating or making decisions? 1   Walking or climbing stairs? 1   Dressing or bathing? 1   Comment bathing needs assistance   Doing errands, shopping? 1 1  Preparing Food and eating ? Y   Comment meals on wheels   Using the Toilet? N   In the past six months, have you accidently leaked urine? Y   Comment wears a briefs   Do you have problems with loss of bowel control? Y   Managing your Medications? N   Managing your Finances? N   Housekeeping or managing your  Housekeeping? N     Patient Care Team: [name], [name], PA-C as PCP - General (Physician Assistant) O'Neal,  Ronnald Ramp, MD as PCP - Cardiology (Cardiology) Verner Chol, [facility] (Inactive) as Pharmacist (Pharmacist) Szabat, Vinnie Level, [facility] (Inactive) as Pharmacist (Pharmacist) Duanne Guess, MD as Consulting Physician (General Surgery) Carlus Pavlov, MD as Consulting Physician (Internal Medicine)  Indicate any recent Medical Services you may have received from other than [facility] providers in the past year (date may be approximate).     Assessment:   This is a routine wellness examination for Eziah.  Hearing/Vision screen Hearing Screening - Comments:: Pt denies any hearing issues  Vision Screening - Comments:: Pt follows up with dr Elmer Picker for annual eye exams    Goals Addressed             This Visit's Progress    Patient Stated       Working on better health and activity        Depression Screen    11/21/2022    1:36 PM 11/20/2022    8:34 AM 09/20/2022    2:14 PM 07/14/2020    2:00 PM 03/16/2020   10:20 AM 10/23/2019   11:32 AM 10/07/2019    1:35 PM  PHQ 2/9 Scores  PHQ - 2 Score 6 5 0 0 0 6 6  PHQ- 9 Score 15 19 0 0 0 27 27  Exception Documentation      Medical reason     Fall Risk    11/21/2022    1:40 PM 11/20/2022    8:34 AM 09/20/2022    2:13 PM 10/23/2019   11:49 AM 03/18/2019    9:44 AM  Fall Risk   Falls in the past year? 1 1 1 1 1   Number falls in past yr: 1 1 1 1  0  Injury with Fall? 0 0 0 1 1  Risk for fall due to : Impaired balance/gait;Impaired mobility;Impaired vision;History of fall(s) History of fall(s) Impaired balance/gait;History of fall(s) History of fall(s);Impaired balance/gait;Impaired mobility   Follow up Falls prevention discussed Falls prevention discussed Falls evaluation completed Education provided;Falls prevention discussed     MEDICARE RISK AT HOME: Medicare Risk at Home Any stairs in or around the home?: No If so, are there any without handrails?: No Home free of loose throw rugs in walkways, pet beds, electrical cords,  etc?: Yes Adequate lighting in your home to reduce risk of falls?: Yes Life alert?: No Use of a cane, walker or w/c?: Yes Grab bars in the bathroom?: No Shower chair or bench in shower?: Yes Elevated toilet seat or a handicapped toilet?: No  TIMED UP AND GO:  Was the test performed?  No    Cognitive Function:    11/21/2022    1:40 PM  MMSE - Mini Mental State Exam  Not completed: Unable to complete        Immunizations Immunization History  Administered Date(s) Administered   Influenza Split 11/22/2010   Influenza-Unspecified 01/17/2018, 10/31/2019, 10/20/2020   PFIZER(Purple Top)SARS-COV-2 Vaccination 02/08/2019, 03/04/2019, 10/31/2019   Pneumococcal Conjugate-13 02/17/2018   Pneumococcal Polysaccharide-23 02/27/2011, 01/13/2020   Tdap 02/27/2011, 07/21/2014   Zoster, Live 03/18/2014    TDAP status: Up to date  Flu Vaccine status: Due, Education has been provided regarding the importance of this vaccine. Advised may receive this vaccine at local pharmacy or Health Dept. Aware to provide a copy of the vaccination record if obtained from local pharmacy or Health Dept. Verbalized acceptance and  understanding.  Pneumococcal vaccine status: Up to date  Covid-19 vaccine status: Completed vaccines  Qualifies for Shingles Vaccine? Yes   Zostavax completed No   Shingrix Completed?: No.    Education has been provided regarding the importance of this vaccine. Patient has been advised to call insurance company to determine out of pocket expense if they have not yet received this vaccine. Advised may also receive vaccine at local pharmacy or Health Dept. Verbalized acceptance and understanding.  Screening Tests Health Maintenance  Topic Date Due   Zoster Vaccines- Shingrix (1 of 2) 02/03/2003   Diabetic kidney evaluation - Urine ACR  03/16/2021   Colonoscopy  03/29/2021   INFLUENZA VACCINE  04/01/2023 (Originally 08/02/2022)   HEMOGLOBIN A1C  05/13/2023   OPHTHALMOLOGY EXAM   08/30/2023   FOOT EXAM  09/05/2023   Diabetic kidney evaluation - eGFR measurement  11/21/2023   Medicare Annual Wellness (AWV)  11/21/2023   DTaP/Tdap/Td (3 - Td or Tdap) 07/20/2024   Pneumonia Vaccine 87+ Years old  Completed   COVID-19 Vaccine  Completed   HPV VACCINES  Aged Out   Hepatitis C Screening  Discontinued    Health Maintenance  Health Maintenance Due  Topic Date Due   Zoster Vaccines- Shingrix (1 of 2) 02/03/2003   Diabetic kidney evaluation - Urine ACR  03/16/2021   Colonoscopy  03/29/2021    Colorectal cancer screening: Type of screening: Colonoscopy. Completed 03/29/21 over due postponed at this time . Repeat every 2 years   Additional Screening:  Hepatitis C Screening: does not qualify  Vision Screening: Recommended annual ophthalmology exams for early detection of glaucoma and other disorders of the eye. Is the patient up to date with their annual eye exam?  Yes  Who is the provider or what is the name of the office in which the patient attends annual eye exams? [facility]  If pt is not established with a provider, would they like to be referred to a provider to establish care? No .   Dental Screening: Recommended annual dental exams for proper oral hygiene  Diabetic Foot Exam: Diabetic Foot Exam: Completed 09/05/23  Community Resource Referral / Chronic Care Management: CRR required this visit?  Yes   CCM required this visit?  PCP informed of CCM need     Plan:     I have personally reviewed and noted the following in the patient's chart:   Medical and social history Use of alcohol, tobacco or illicit drugs  Current medications and supplements including opioid prescriptions. Patient is not currently taking opioid prescriptions. Functional ability and status Nutritional status Physical activity Advanced directives List of other physicians Hospitalizations, surgeries, and ER visits in previous 12 months Vitals Screenings to include cognitive,  depression, and falls Referrals and appointments  In addition, I have reviewed and discussed with patient certain preventive protocols, quality metrics, and best practice recommendations. A written personalized care plan for preventive services as well as general preventive health recommendations were provided to patient.     Marzella Schlein, LPN   [id number]/[id number]   After Visit Summary: (MyChart) Due to this being a telephonic visit, the after visit summary with patients personalized plan was offered to patient via MyChart   Nurse Notes: Pt is in the hospital at times of AWV , son [name] is requesting any resources. A submission was sent to [facility]

## 2022-11-21 NOTE — Evaluation (Signed)
Speech Language Pathology Evaluation Patient Details Name: [name] [name] MRN: [id number] DOB: February 05, 1955 Today's Date: 11/21/2022 Time: 3086-5784 SLP Time Calculation (min) (ACUTE ONLY): 27 min  Problem List:  Patient Active Problem List   Diagnosis Date Noted   Noncompliance 11/20/2022   Requires continuous supervision for activities of daily living (ADL) 11/20/2022   Hypertensive emergency 11/20/2022   Pre-ulcerative calluses 03/05/2022   Protein-calorie malnutrition, severe 01/06/2022   UTI (urinary tract infection) 01/05/2022   Acute urinary retention 01/04/2022   Chronic diastolic CHF (congestive heart failure) (HCC) 01/04/2022   Atrial fibrillation with slow ventricular response (HCC) 01/03/2022   Hallucinations 01/03/2022   Hematuria 01/03/2022   Type 2 diabetes mellitus with proliferative retinopathy of both eyes, with long-term current use of insulin (HCC) 08/18/2021   Type 2 diabetes mellitus with diabetic polyneuropathy, with long-term current use of insulin (HCC) 08/18/2021   Diabetes mellitus (HCC) 08/18/2021   Postherpetic neuralgia 08/11/2021   Blood clotting disorder (HCC) 07/12/2021   Type 1 diabetes mellitus (HCC) 02/16/2021   Chronic diarrhea 02/16/2021   Diarrhea 02/15/2021   Sinus bradycardia 02/15/2021   Acute exacerbation of CHF (congestive heart failure) (HCC) 02/14/2021   Iron deficiency anemia secondary to inadequate dietary iron intake 07/14/2020   Diabetic ulcer of left foot associated with type 1 diabetes mellitus (HCC) 06/30/2020   Pressure injury of skin 05/24/2020   BPH with obstruction/lower urinary tract symptoms 03/16/2020   PAF (paroxysmal atrial fibrillation) (HCC) 03/16/2020   Type 1 diabetes mellitus with diabetic foot infection (HCC) 01/27/2020   Normocytic anemia 01/13/2020   Encounter for general adult medical examination with abnormal findings 01/13/2020   Hyperlipidemia LDL goal <70 01/13/2020   Benign prostatic hyperplasia  without lower urinary tract symptoms 01/13/2020   PVD (peripheral vascular disease) (HCC) 01/13/2020   Coronary artery disease involving bypass graft of transplanted heart without angina pectoris 01/13/2020   Paroxysmal atrial fibrillation (HCC) 01/13/2020   Recurrent major depressive disorder, in partial remission (HCC) 01/13/2020   Major depression, recurrent (HCC) 12/09/2019   Tear of lateral meniscus of knee 10/05/2019   Aortic dissection (HCC) 02/05/2019   Coronary artery disease 02/05/2019   Hypertension 02/05/2019   Psoriasis 02/05/2019   Vitamin D deficiency 01/07/2019   Uncontrolled type 2 diabetes mellitus with hyperglycemia (HCC) 01/07/2019   Microalbuminuria 01/07/2019   Depression 06/10/2018   Patellar tendonitis of right knee 04/24/2018   Peyronie's disease 06/09/2015   Erectile dysfunction 03/15/2015   S/P aortic aneurysm repair 05/19/2012   S/P CABG (coronary artery bypass graft) 05/19/2012   Non-proliferative diabetic retinopathy (HCC) 02/27/2011   Contracture of hand joint 11/01/2010   Past Medical History:  Past Medical History:  Diagnosis Date   Anxiety    Arthritis    Cataract    CHF (congestive heart failure) (HCC)    Constipation    pt states goes EOD- hard stools    Coronary artery disease    Depression    Diabetes mellitus without complication (HCC)    Diabetic retinopathy (HCC)    DKA (diabetic ketoacidoses)    Hyperlipidemia    Hypertension    off meds   Hypertensive retinopathy    Non-intractable vomiting without nausea 06/10/2018   Postherpetic neuralgia 08/11/2021   Speculative diagnosis   Psoriasis    Tubular adenoma of colon 03/2019   Past Surgical History:  Past Surgical History:  Procedure Laterality Date   ABDOMINAL AORTOGRAM W/LOWER EXTREMITY N/A 08/30/2020   Procedure: ABDOMINAL AORTOGRAM W/LOWER EXTREMITY;  Surgeon: [name] [name],  [name] Lowes, MD;  Location: [facility];  Service: Cardiovascular;  Laterality: N/A;   ABDOMINAL  AORTOGRAM W/LOWER EXTREMITY N/A 11/15/2020   Procedure: ABDOMINAL AORTOGRAM W/LOWER EXTREMITY;  Surgeon: [name] Libman, MD;  Location: [facility];  Service: Cardiovascular;  Laterality: N/A;   ANKLE SURGERY     right   CARDIAC CATHETERIZATION  09/25/2001   COLONOSCOPY     ~10 yr ago- normal  per pt    CORONARY ARTERY BYPASS GRAFT  09/30/2001   CABG x 4   IR THORACENTESIS ASP PLEURAL SPACE W/IMG GUIDE  05/19/2020   LAPAROSCOPIC INGUINAL HERNIA REPAIR Bilateral 02/09/2010   MASS EXCISION Left 03/12/2013   Procedure: LEFT INDEX EXCISION MASS AND DEBRIDMENT DISTAL INTERPHALANGEAL JOINT;  Surgeon: Tami Ribas, MD;  Location: [facility];  Service: Orthopedics;  Laterality: Left;   PERIPHERAL VASCULAR BALLOON ANGIOPLASTY Left 08/30/2020   Procedure: PERIPHERAL VASCULAR BALLOON ANGIOPLASTY;  Surgeon: [name] Libman, MD;  Location: [facility];  Service: Cardiovascular;  Laterality: Left;  Peroneal   HPI:  Pt is a 67 y.o. male who presented 11/20/22 as referral from his PCP secondary to uncontrolled blood pressure. PMH: A-fib on Eliquis, CAD s/p CABG, PAD, diastolic CHF, DM2 on insulin pump, HTN, HLD, depression. CT head   Assessment / Plan / Recommendation Clinical Impression  [name] participated in [employer], multimedia. He presents with diffuse cognitive impairment, impacted additionally by visual and hearing deficits. There were notable impairments in sustained and selective attention, difficulty retaining information in working memory in order to respond to questions or complete tasks.  He was oriented to person, elements of time (year and month, but not day or time of day), knew he was in a [facility] facility but thought he was at [facility].  He was unable to state why he is hospitalized.  Awareness/insight were significantly impaired. He could not follow multistep commands, likely related to inattention. Content of language was tangential. He had  difficulty organizing info and understanding task instructions (e.g, during category naming task, he named three animals, then "cabinet" and "calendar" as he looked around his room.) Agree with OT that he would need 24 hour supervision were he to D/C home given deficits impacting so many components of this cognition.  SLP will follow while admitted.    SLP Assessment  SLP Recommendation/Assessment: Patient needs continued Speech Lanaguage Pathology Services SLP Visit Diagnosis: Cognitive communication deficit (R41.841)    Recommendations for follow up therapy are one component of a multi-disciplinary discharge planning process, led by the attending physician.  Recommendations may be updated based on patient status, additional functional criteria and insurance authorization.    Follow Up Recommendations    24 hour supervision   Assistance Recommended at Discharge  Frequent or constant Supervision/Assistance  Functional Status Assessment Patient has had a recent decline in their functional status and demonstrates the ability to make significant improvements in function in a reasonable and predictable amount of time.  Frequency and Duration min 2x/week  2 weeks      SLP Evaluation Cognition  Overall Cognitive Status: Difficult to assess Arousal/Alertness: Awake/alert Orientation Level: Oriented to person;Disoriented to place;Disoriented to time;Disoriented to situation Attention: Sustained;Selective Sustained Attention: Impaired Sustained Attention Impairment: Functional basic;Verbal basic Selective Attention: Impaired Selective Attention Impairment: Verbal basic;Functional basic Memory: Impaired Memory Impairment: Storage deficit;Retrieval deficit Awareness: Impaired Awareness Impairment: Intellectual impairment Problem Solving: Impaired Executive Function: Reasoning Reasoning: Impaired Reasoning Impairment: Verbal basic  Comprehension  Auditory Comprehension Overall  Auditory Comprehension: Impaired Yes/No Questions: Within Functional Limits Commands: Impaired One Step Basic Commands: 75-100% accurate Two Step Basic Commands: 50-74% accurate Reading Comprehension Reading Status: Unable to assess (comment) (visual deficits at baseline)    Expression Expression Primary Mode of Expression: Verbal Verbal Expression Overall Verbal Expression: Impaired Initiation: No impairment Level of Generative/Spontaneous Verbalization: Conversation Repetition: Impaired Naming: Impairment Divergent: 0-24% accurate Pragmatics: Impairment Written Expression Dominant Hand: Right   Oral / Motor  Oral Motor/Sensory Function Overall Oral Motor/Sensory Function: Within functional limits Motor Speech Overall Motor Speech: Appears within functional limits for tasks assessed            [name] 11/21/2022, 3:33 PM Marchelle Folks L. Samson Frederic, MA CCC/SLP Clinical Specialist - Acute Care SLP Acute Rehabilitation Services Office number [phone]

## 2022-11-21 NOTE — Patient Instructions (Signed)
[name] [name] , Thank you for taking time to come for your Medicare Wellness Visit. I appreciate your ongoing commitment to your health goals. Please review the following plan we discussed and let me know if I can assist you in the future.   Referrals/Orders/Follow-Ups/Clinician Recommendations: work on better health and activity  Each day, aim for 6 glasses of water, plenty of protein in your diet and try to get up and walk/ stretch every hour for 5-10 minutes at a time.    This is a list of the screening recommended for you and due dates:  Health Maintenance  Topic Date Due   Zoster (Shingles) Vaccine (1 of 2) 02/03/2003   Yearly kidney health urinalysis for diabetes  03/16/2021   Colon Cancer Screening  03/29/2021   Flu Shot  04/01/2023*   Hemoglobin A1C  05/13/2023   Eye exam for diabetics  08/30/2023   Complete foot exam   09/05/2023   Yearly kidney function blood test for diabetes  11/21/2023   Medicare Annual Wellness Visit  11/21/2023   DTaP/Tdap/Td vaccine (3 - Td or Tdap) 07/20/2024   Pneumonia Vaccine  Completed   COVID-19 Vaccine  Completed   HPV Vaccine  Aged Out   Hepatitis C Screening  Discontinued  *Topic was postponed. The date shown is not the original due date.    Advanced directives: (Copy Requested) Please bring a copy of your health care power of attorney and living will to the office to be added to your chart at your convenience.  Next Medicare Annual Wellness Visit scheduled for next year: Yes

## 2022-11-21 NOTE — Evaluation (Signed)
Occupational Therapy Evaluation Patient Details Name: [name] [name] MRN: [id number] DOB: 01/06/1955 Today's Date: 11/21/2022   History of Present Illness Pt is a 67 y.o. male who presented 11/20/22 as referral from his PCP secondary to uncontrolled blood pressure. PMH: A-fib on Eliquis, CAD s/p CABG, PAD, diastolic CHF, DM2 on insulin pump, HTN, HLD, depression   Clinical Impression   Prior to this admission, patient assumed independent at home. All information gleaned from chart back in January of 2024 from previous hospital admission as patient was unable to state any home history for OT.  Currently, patient presenting with significant confusion (see cognition section), and need for increased assist to complete all aspects of care. Patient unable to motor plan sitting on the EOB, requiring total A with significant cues and simplified language in order to complete. Once seated EOB, patient able to complete sit<>stand at min A but fatiguing quickly with minimal marching in place. Patient currently mod A for ADL management. OT recommending rehab of a lesser intensity at discharge < 3 hours. If patient is to discharge home, it is imperative he have 24/7 supervision as he is a high fall risk, and at risk for polypharmacy and home safety hazards.      If plan is discharge home, recommend the following: A lot of help with walking and/or transfers;A lot of help with bathing/dressing/bathroom;Assistance with cooking/housework;Assistance with feeding;Direct supervision/assist for medications management;Direct supervision/assist for financial management;Assist for transportation;Help with stairs or ramp for entrance;Supervision due to cognitive status    Functional Status Assessment  Patient has had a recent decline in their functional status and demonstrates the ability to make significant improvements in function in a reasonable and predictable amount of time.  Equipment Recommendations  Other  (comment) (defer to next venue)    Recommendations for Other Services       Precautions / Restrictions Precautions Precautions: Fall Restrictions Weight Bearing Restrictions: No      Mobility Bed Mobility Overal bed mobility: Needs Assistance Bed Mobility: Supine to Sit, Sit to Supine     Supine to sit: Total assist Sit to supine: Min assist   General bed mobility comments: Patient total A to come to the EOB due to inability to motor plan despite simplified language and cues. Patient min A to return to supine    Transfers Overall transfer level: Needs assistance Equipment used: Rolling walker (2 wheels) Transfers: Sit to/from Stand Sit to Stand: Min assist           General transfer comment: Min A to come into standing, fatiguing quickly with marching in place      Balance Overall balance assessment: Needs assistance Sitting-balance support: Bilateral upper extremity supported, Feet supported Sitting balance-Leahy Scale: Fair     Standing balance support: Bilateral upper extremity supported, During functional activity, Reliant on assistive device for balance Standing balance-Leahy Scale: Poor Standing balance comment: reliant on RW                           ADL either performed or assessed with clinical judgement   ADL Overall ADL's : Needs assistance/impaired Eating/Feeding: Set up;Sitting   Grooming: Minimal assistance;Sitting   Upper Body Bathing: Minimal assistance;Sitting   Lower Body Bathing: Maximal assistance;Total assistance;Sit to/from stand;Sitting/lateral leans   Upper Body Dressing : Minimal assistance;Sitting   Lower Body Dressing: Maximal assistance;Total assistance;Sitting/lateral leans;Sit to/from stand   Toilet Transfer: Minimal assistance;Ambulation;Rolling walker (2 wheels) Toilet Transfer Details (indicate cue type and  reason): simulated from EOB Toileting- Clothing Manipulation and Hygiene: Minimal  assistance;Sitting/lateral lean;Sit to/from stand       Functional mobility during ADLs: Moderate assistance;Cueing for safety;Cueing for sequencing;Rolling walker (2 wheels) General ADL Comments: Patient presenting with significant confusion, and need for increased assist to complete all aspects of care. Patient unable to motor plan sitting on the EOB, requiring total A with significant cues and simplified language in order to complete. Once seated EOB, patient able to complete sit<>stand at min A but fatiguing quickly with minimal marching in place. Patient currently mod A for ADL management. OT recommending rehab of a lesser intensity at discharge < 3 hours. If patient is to discharge home, it is imperative he have 24/7 supervision as he is a high fall risk, and at risk for polypharmacy and home safety hazards.     Vision Baseline Vision/History: 1 Wears glasses Ability to See in Adequate Light: 0 Adequate Patient Visual Report: No change from baseline Additional Comments: Does not appear to have a visual deficit, unable to tell OT     Perception Perception: Not tested       Praxis Praxis: Not tested       Pertinent Vitals/Pain Pain Assessment Pain Assessment: Faces Faces Pain Scale: No hurt Pain Intervention(s): Limited activity within patient's tolerance, Monitored during session, Repositioned     Extremity/Trunk Assessment Upper Extremity Assessment Upper Extremity Assessment: Generalized weakness   Lower Extremity Assessment Lower Extremity Assessment: Defer to PT evaluation   Cervical / Trunk Assessment Cervical / Trunk Assessment: Kyphotic (minimally)   Communication Communication Communication: No apparent difficulties Cueing Techniques: Verbal cues;Gestural cues;Visual cues;Tactile cues   Cognition Arousal: Lethargic Behavior During Therapy: Flat affect Overall Cognitive Status: Impaired/Different from baseline Area of Impairment: Orientation, Attention,  Memory, Following commands, Awareness, Safety/judgement, Problem solving                 Orientation Level: Time, Situation Current Attention Level: Focused Memory: Decreased recall of precautions, Decreased short-term memory Following Commands: Follows one step commands inconsistently, Follows one step commands with increased time Safety/Judgement: Decreased awareness of safety, Decreased awareness of deficits Awareness: Intellectual Problem Solving: Slow processing, Decreased initiation, Difficulty sequencing, Requires verbal cues, Requires tactile cues General Comments: Patient stating its December for the month, then stating "February" when asked the year, unable to motor plan how to get out of bed despite increased cues, simplified language, and gestures. Patient falling asleep frequently during OT evaluation, and also found covered in applesauce and appeared to be fully unaware.     General Comments       Exercises     Shoulder Instructions      Home Living Family/patient expects to be discharged to:: Private residence   Available Help at Discharge: Family;Available PRN/intermittently Type of Home: Apartment Home Access: Level entry     Home Layout: One level     Bathroom Shower/Tub: Tub/shower unit;Sponge bathes at baseline   Bathroom Toilet: Standard Bathroom Accessibility: Yes   Home Equipment: Rollator (4 wheels);Wheelchair - manual;Shower seat   Additional Comments: sleeps in recliner majority of time; bathroom is about 10 steps from where he sleeps...all information from previous admission in January, unable to state any home set up      Prior Functioning/Environment Prior Level of Function : Needs assist             Mobility Comments: Uses rollator for mobility in home but uses w/c for going to appointments as pt fatigues easily. Hx of falls...all  information from previous admission in January, unable to state any home set up ADLs Comments: Mod I  with ADLs.Reports he rarely changes his socks, states he doesn't want to. Reports his son manages his meds and he pays his own bills as well as prepares light meals with stove use. Has not driven in over a year. Reliant on son for transportation....all information from previous admission in January, unable to state any home set up        OT Problem List: Decreased strength;Decreased activity tolerance;Impaired balance (sitting and/or standing);Decreased coordination;Decreased cognition;Decreased safety awareness;Decreased knowledge of use of DME or AE;Decreased knowledge of precautions      OT Treatment/Interventions: Self-care/ADL training;Therapeutic exercise;Energy conservation;DME and/or AE instruction;Manual therapy;Therapeutic activities;Cognitive remediation/compensation;Patient/family education;Balance training    OT Goals(Current goals can be found in the care plan section) Acute Rehab OT Goals Patient Stated Goal: unable OT Goal Formulation: Patient unable to participate in goal setting Time For Goal Achievement: 12/05/22 Potential to Achieve Goals: Fair ADL Goals Pt Will Perform Lower Body Bathing: with supervision;sitting/lateral leans;sit to/from stand Pt Will Perform Lower Body Dressing: with supervision;sitting/lateral leans;sit to/from stand Pt Will Transfer to Toilet: with supervision;ambulating;regular height toilet Pt Will Perform Toileting - Clothing Manipulation and hygiene: with supervision;sitting/lateral leans;sit to/from stand Additional ADL Goal #1: Patient will be able to complete cognitive assessment without errors in order to return home independently. Additional ADL Goal #2: Patient will be able to demonstrate approrpiate motor planning to complete basic mobility and ADL tasks without need for multi-modal cueing.  OT Frequency: Min 1X/week    Co-evaluation              AM-PAC OT "6 Clicks" Daily Activity     Outcome Measure Help from another person  eating meals?: A Little Help from another person taking care of personal grooming?: A Little Help from another person toileting, which includes using toliet, bedpan, or urinal?: A Little Help from another person bathing (including washing, rinsing, drying)?: A Lot Help from another person to put on and taking off regular upper body clothing?: A Little Help from another person to put on and taking off regular lower body clothing?: A Lot 6 Click Score: 16   End of Session Equipment Utilized During Treatment: Gait belt;Rolling walker (2 wheels) Nurse Communication: Mobility status;Other (comment) (Confusion)  Activity Tolerance: Patient limited by fatigue;Other (comment) (Significant confusion) Patient left: in bed;with call bell/phone within reach;with bed alarm set  OT Visit Diagnosis: Unsteadiness on feet (R26.81);Other abnormalities of gait and mobility (R26.89);Repeated falls (R29.6);Muscle weakness (generalized) (M62.81);History of falling (Z91.81);Other symptoms and signs involving cognitive function                Time: 1914-7829 OT Time Calculation (min): 25 min Charges:  OT General Charges $OT Visit: 1 Visit OT Evaluation $OT Eval Moderate Complexity: 1 Mod OT Treatments $Self Care/Home Management : 8-22 mins  Pollyann Glen E. Diamond Jentz, OTR/L Acute Rehabilitation Services [phone]   Cherlyn Cushing 11/21/2022, 11:52 AM

## 2022-11-21 NOTE — Inpatient Diabetes Management (Addendum)
Inpatient Diabetes Program Recommendations  AACE/ADA: New Consensus Statement on Inpatient Glycemic Control (2015)  Target Ranges:  Prepandial:   less than 140 mg/dL      Peak postprandial:   less than 180 mg/dL (1-2 hours)      Critically ill patients:  140 - 180 mg/dL   Lab Results  Component Value Date   GLUCAP 192 (H) 11/21/2022   HGBA1C 9.8 (A) 11/13/2022    Review of Glycemic Control  Latest Reference Range & Units 11/20/22 10:34 11/20/22 13:15 11/20/22 20:52 11/21/22 05:59 11/21/22 11:06  Glucose-Capillary 70 - 99 mg/dL 89 403 (H) 474 (H) 259 (H) 192 (H)   Diabetes history: DM 2 (per note from Dr. Lonzo Cloud on 11/13/22) Outpatient Diabetes medications:  Omnipod insulin pump: Pump   Omnipod Settings   Insulin type   Novolog     Basal rate          0000 0.85                       I:C ratio          0000 1:1      Enter # 5g                       Sensitivity          0000  55         Goal          0000  120        Current orders for Inpatient glycemic control:  Novolog 0-9 units tid with  meals and HS Inpatient Diabetes Program Recommendations:    Note patient see's Endocinology- Dr. Lonzo Cloud.  It appears that patient will need insulin for glucose control based on history of use of insulin pump.  May consider adding Semglee 8 units daily while in the hospital.  Will attempt to speak with patient/family today.  Agree that insulin pump may not be best option with confusion.   Addendum 1600-  Briefly spoke to patient regarding DM and insulin pump.Patient seems confused and attempted to discuss use of insulin pump but his descriptions were not clear. I explained use of SQ insulin while in the hospital and continued evaluation for home needs related to DM.  He will need safe plan for discharge and use of insulin.   Thanks,  Beryl Meager, RN, BC-ADM Inpatient Diabetes Coordinator Pager [phone]  (8a-5p)

## 2022-11-21 NOTE — Evaluation (Signed)
Physical Therapy Evaluation Patient Details Name: [name] [name] MRN: [id number] DOB: 01/26/1955 Today's Date: 11/21/2022  History of Present Illness  Pt is a 67 y.o. male who presented 11/20/22 as referral from his PCP secondary to uncontrolled blood pressure. PMH: A-fib on Eliquis, CAD s/p CABG, PAD, diastolic CHF, DM2 on insulin pump, HTN, HLD, depression   Clinical Impression  Pt presents with condition above and deficits mentioned below, see PT Problem List. Currently, pt is a poor historian. Thus, PLOF and home info below was carried over from his prior admission in January as no family was present this session either. Per the January entry, pt lives in a 1-level apartment with a level entry and is mod I using a rollator for mobility in the home but a w/c in the community. Currently, pt is at high risk for falls and injuries due to his deficits noted in strength, balance, activity tolerance, and cognition. He required minA for transfers and to ambulate a household distance with a RW for support this date. Pt could benefit from short-term inpatient rehab, < 3 hours/day, to maximize his safety and independence with functional mobility. However, if he has the level of care needed he could d/c home with HHPT follow-up. At this time, pt requires 24/7 assistance and would be unsafe to be home alone.       If plan is discharge home, recommend the following: A little help with walking and/or transfers;A little help with bathing/dressing/bathroom;Assistance with cooking/housework;Direct supervision/assist for medications management;Direct supervision/assist for financial management;Assist for transportation;Help with stairs or ramp for entrance   Can travel by private vehicle   Yes    Equipment Recommendations Rolling walker (2 wheels) (if pt does not already have a RW/rollator)  Recommendations for Other Services       Functional Status Assessment Patient has had a recent decline in their  functional status and demonstrates the ability to make significant improvements in function in a reasonable and predictable amount of time.     Precautions / Restrictions Precautions Precautions: Fall Restrictions Weight Bearing Restrictions: No      Mobility  Bed Mobility Overal bed mobility: Needs Assistance Bed Mobility: Supine to Sit     Supine to sit: Contact guard, HOB elevated, Used rails     General bed mobility comments: Extra time to transition supine to sit R EOB with HOB elevated, CGA for safety    Transfers Overall transfer level: Needs assistance Equipment used: Rolling walker (2 wheels) Transfers: Sit to/from Stand Sit to Stand: Min assist           General transfer comment: MinA for balance transferring to stand from EOB to RW    Ambulation/Gait Ambulation/Gait assistance: Min assist Gait Distance (Feet): 45 Feet Assistive device: Rolling walker (2 wheels) Gait Pattern/deviations: Step-through pattern, Decreased step length - right, Decreased step length - left, Decreased stride length, Trunk flexed Gait velocity: reduced Gait velocity interpretation: <1.8 ft/sec, indicate of risk for recurrent falls   General Gait Details: Pt takes slow, small steps to the door and back. He required minA for balance.  Stairs            Wheelchair Mobility     Tilt Bed    Modified Rankin (Stroke Patients Only)       Balance Overall balance assessment: Needs assistance Sitting-balance support: Feet supported, No upper extremity supported Sitting balance-Leahy Scale: Fair     Standing balance support: Bilateral upper extremity supported, During functional activity, Reliant on assistive device  for balance Standing balance-Leahy Scale: Poor Standing balance comment: reliant on RW                             Pertinent Vitals/Pain Pain Assessment Pain Assessment: Faces Faces Pain Scale: No hurt Pain Intervention(s): Monitored during  session    Home Living Family/patient expects to be discharged to:: Private residence   Available Help at Discharge: Family;Available PRN/intermittently Type of Home: Apartment Home Access: Level entry       Home Layout: One level Home Equipment: Rollator (4 wheels);Wheelchair - manual;Shower seat Additional Comments: sleeps in recliner majority of time; bathroom is about 10 steps from where he sleeps...all information from previous admission in January, pt poor historian this date    Prior Function Prior Level of Function : Needs assist             Mobility Comments: Uses rollator for mobility in home but uses w/c for going to appointments as pt fatigues easily. Hx of falls...all information from previous admission in January, pt poor historian currently ADLs Comments: Mod I with ADLs.Reports he rarely changes his socks, states he doesn't want to. Reports his son manages his meds and he pays his own bills as well as prepares light meals with stove use. Has not driven in over a year. Reliant on son for transportation....all information from previous admission in January, pt poor historian currently     Extremity/Trunk Assessment   Upper Extremity Assessment Upper Extremity Assessment: Defer to OT evaluation    Lower Extremity Assessment Lower Extremity Assessment: Generalized weakness    Cervical / Trunk Assessment Cervical / Trunk Assessment: Kyphotic (minimally)  Communication   Communication Communication: No apparent difficulties  Cognition Arousal: Alert Behavior During Therapy: WFL for tasks assessed/performed Overall Cognitive Status: Impaired/Different from baseline Area of Impairment: Attention, Memory, Following commands, Awareness, Safety/judgement, Problem solving                   Current Attention Level: Sustained Memory: Decreased short-term memory Following Commands: Follows one step commands with increased time, Follows one step commands  consistently Safety/Judgement: Decreased awareness of safety, Decreased awareness of deficits Awareness: Emergent Problem Solving: Slow processing, Decreased initiation, Difficulty sequencing, Requires verbal cues, Requires tactile cues General Comments: Pt alert this session. Pt asking if he could go home and was oriented to location and situation. However, pt still confused as he did not recognize his own cell phone later in the session even though he was aware of it at the start. Pt also with tangential speech about his family, which was difficult to follow. Poor awareness of his deficits impacting his safety currently. Follows simple commands.        General Comments General comments (skin integrity, edema, etc.): BP stable throughout    Exercises     Assessment/Plan    PT Assessment Patient needs continued PT services  PT Problem List Decreased strength;Decreased activity tolerance;Decreased balance;Decreased mobility;Decreased cognition;Decreased safety awareness;Cardiopulmonary status limiting activity       PT Treatment Interventions DME instruction;Gait training;Functional mobility training;Therapeutic activities;Therapeutic exercise;Balance training;Neuromuscular re-education;Cognitive remediation;Patient/family education    PT Goals (Current goals can be found in the Care Plan section)  Acute Rehab PT Goals Patient Stated Goal: to go home PT Goal Formulation: With patient Time For Goal Achievement: 12/05/22 Potential to Achieve Goals: Good    Frequency Min 1X/week     Co-evaluation  AM-PAC PT "6 Clicks" Mobility  Outcome Measure Help needed turning from your back to your side while in a flat bed without using bedrails?: A Little Help needed moving from lying on your back to sitting on the side of a flat bed without using bedrails?: A Little Help needed moving to and from a bed to a chair (including a wheelchair)?: A Little Help needed standing up  from a chair using your arms (e.g., wheelchair or bedside chair)?: A Little Help needed to walk in hospital room?: A Little Help needed climbing 3-5 steps with a railing? : A Lot 6 Click Score: 17    End of Session Equipment Utilized During Treatment: Gait belt Activity Tolerance: Patient tolerated treatment well Patient left: in chair;with call bell/phone within reach;with chair alarm set Nurse Communication: Mobility status;Other (comment) (BP) PT Visit Diagnosis: Unsteadiness on feet (R26.81);Other abnormalities of gait and mobility (R26.89);Muscle weakness (generalized) (M62.81);Difficulty in walking, not elsewhere classified (R26.2);History of falling (Z91.81);Repeated falls (R29.6)    Time: 8295-6213 PT Time Calculation (min) (ACUTE ONLY): 15 min   Charges:   PT Evaluation $PT Eval Moderate Complexity: 1 Mod   PT General Charges $$ ACUTE PT VISIT: 1 Visit         Virgil Benedict, PT, DPT Acute Rehabilitation Services  Office: [phone]   Bettina Gavia 11/21/2022, 4:35 PM

## 2022-11-21 NOTE — Addendum Note (Signed)
Addended by: Marzella Schlein on: 11/21/2022 01:57 PM   Modules accepted: Orders

## 2022-11-21 NOTE — Progress Notes (Signed)
PROGRESS NOTE    [name] [name]  NWG:[id number] DOB: Dec 31, 1955 DOA: 11/20/2022 PCP: Bary Leriche, PA-C    Brief Narrative:  This 67 yrs old Male with PMH significant for diabetes mellitus II, on insulin pump for last 3 years, CAD with history of CABG in 2003, hypertension, peripheral vascular disease, paroxysmal A-fib on Eliquis, with slow ventricular response, hyperlipidemia who  was sent by his PCP to ED for uncontrolled blood pressure, he has not been able to take care of himself at home, reports He had 2 episodes of hypoglycemia where he was found unconscious on the floor over last 2 weeks due to poor oral intake, as well patient has not been compliant with medications. He was found to have significantly high blood pressure in the ED 233/102. Chest x-ray significant for increased  vascular congestion >pulmonary edema, Patient required multiple medications for blood pressure control including p.o. clonidine, losartan, IV hydralazine, blood pressure remains elevated, his BNP was  elevated at 262. He was admitted for hypertensive urgency as well as recurrent hypoglycemic episodes.  Patient needs placement   Assessment & Plan:   Principal Problem:   Hypertensive emergency Active Problems:   Coronary artery disease   Benign prostatic hyperplasia without lower urinary tract symptoms   PAF (paroxysmal atrial fibrillation) (HCC)   Acute exacerbation of CHF (congestive heart failure) (HCC)   Diabetes mellitus (HCC)   Atrial fibrillation with slow ventricular response (HCC)   Hypertensive urgency: Patient presented with significantly elevated blood pressure. Most likely due to noncompliance with his medications and diuretics. Continue hydralazine 50 mg every 8 hours , amlodipine 10 mg daily Continue hydralazine 10 mg IVP every 6 hours as needed. No beta-blockers for now given known slow ventricular response and low heart rate. Avoid clonidine due to withdrawals. Continue  losartan and spironolactone. Blood pressure is improved.   Acute on chronic diastolic CHF: He presented with volume overload on imaging, elevated BNP, most likely provoked due to above. Continue with IV Lasix 40 mg IV twice daily Continue Aldactone 25 mg dialy. Echo 1/24 : LVEF 60 to 65%, normal LV function. Follow up repeat Echo. Monitor daily weight, intake output charting.    Diabetes mellitus, type II, uncontrolled with hypoglycemia: Son reports his father had 2 episodes of hypoglycemia over the last couple weeks. Not sure at this point if his insulin pump will be the best option, it has already been discontinued while in ED, will place on insulin sliding scale, and likely will try to put on oral agents of possible, as it would be a safer option in this patient who cannot do his insulin pump or injectable insulin as he lives alone with frequent confusions. Diabetic coordinator consult.   Confusion: Son reports he has been more forgetful recently, Acute confusion could be in the setting of hypertensive urgency. Vit B12 WNL, RPR pending   pA.Fib with slow ventricular response; Not on any heart rate control medication given slow ventricular response. Continue Eliquis   S/P CABG (coronary artery bypass graft) CAD, CABG in 2003 Continue with aspirin and statin. Follow up 2 D echo   Pressure ulcer, POA: Stage 1 on buttock and stage 1 on left heel thanks  Continue with nurse driven pressure ulcer prevention protocol   Consult PT, OT, SLP, and TOC as likely will need some assistance at home.  Hypomagnesemia: Replaced.  Continue to monitor  DVT prophylaxis: Eliquis Code Status: Full code Family Communication: No family at bed side Disposition Plan:  Status is: Observation The patient remains OBS appropriate and will d/c before 2 midnights.  Patient admitted for hypertensive urgency and recurrent hypoglycemic episodes, Need PT and OT evaluation for possible placement.   Patient not able to take care of himself.   Consultants:  None  Procedures: None  Antimicrobials:  Anti-infectives (From admission, onward)    None       Subjective: Patient was seen and examined at bedside.  Overnight events noted.   Patient appears very deconditioned.  Blood pressure improving.  Denies any chest pain.   Heart rate is well-controlled.  Objective: Vitals:   11/21/22 0353 11/21/22 0558 11/21/22 0600 11/21/22 0730  BP:  125/73 125/73 (!) 101/59  Pulse:    (!) 114  Resp:  (!) 27 17 18   Temp: 98.2 F (36.8 C)   98.2 F (36.8 C)  TempSrc: Oral   Oral  SpO2:    93%  Weight:   74.7 kg   Height:        Intake/Output Summary (Last 24 hours) at 11/21/2022 1030 Last data filed at 11/21/2022 0902 Gross per 24 hour  Intake 270.73 ml  Output 1430 ml  Net -1159.27 ml   Filed Weights   11/20/22 1006 11/20/22 1902 11/21/22 0600  Weight: 77.3 kg 77.3 kg 74.7 kg    Examination:  General exam: Appears calm and comfortable, deconditioned, not in any acute distress. Respiratory system: Clear to auscultation. Respiratory effort normal.  RR 16 Cardiovascular system: S1 & S2 heard, RRR. No JVD, murmurs, rubs, gallops or clicks. No pedal edema. Gastrointestinal system: Abdomen is non distended, soft and non tender. Normal bowel sounds heard. Central nervous system: Alert and oriented X 3. No focal neurological deficits. Extremities: No edema, No cyanosis, No clubbing  Skin: No rashes, lesions or ulcers Psychiatry: Judgement and insight appear normal. Mood & affect appropriate.     Data Reviewed: I have personally reviewed following labs and imaging studies  CBC: Recent Labs  Lab 11/20/22 1049 11/21/22 0344  WBC 5.3 5.1  NEUTROABS 3.3 3.1  HGB 12.6* 10.8*  HCT 38.9* 32.3*  MCV 91.3 89.5  PLT 158 149*   Basic Metabolic Panel: Recent Labs  Lab 11/20/22 1049 11/21/22 0344  NA 138 137  K 4.5 3.6  CL 103 102  CO2 31 27  GLUCOSE 99 217*  BUN 14 14   CREATININE 0.69 1.06  CALCIUM 8.5* 7.9*  MG  --  1.5*  PHOS  --  4.2   GFR: Estimated Creatinine Clearance: 69.5 mL/min (by C-G formula based on SCr of 1.06 mg/dL). Liver Function Tests: Recent Labs  Lab 11/20/22 1049  AST 29  ALT 8  ALKPHOS 87  BILITOT 0.6  PROT 5.7*  ALBUMIN 2.9*   No results for input(s): "LIPASE", "AMYLASE" in the last 168 hours. No results for input(s): "AMMONIA" in the last 168 hours. Coagulation Profile: No results for input(s): "INR", "PROTIME" in the last 168 hours. Cardiac Enzymes: No results for input(s): "CKTOTAL", "CKMB", "CKMBINDEX", "TROPONINI" in the last 168 hours. BNP (last 3 results) No results for input(s): "PROBNP" in the last 8760 hours. HbA1C: No results for input(s): "HGBA1C" in the last 72 hours. CBG: Recent Labs  Lab 11/20/22 1034 11/20/22 1315 11/20/22 2052 11/21/22 0559  GLUCAP 89 142* 234* 210*   Lipid Profile: No results for input(s): "CHOL", "HDL", "LDLCALC", "TRIG", "CHOLHDL", "LDLDIRECT" in the last 72 hours. Thyroid Function Tests: Recent Labs    11/20/22 1049  TSH 4.369   Anemia  Panel: Recent Labs    11/21/22 0344  VITAMINB12 308   Sepsis Labs: No results for input(s): "PROCALCITON", "LATICACIDVEN" in the last 168 hours.  No results found for this or any previous visit (from the past 240 hour(s)).   Radiology Studies: CT Head Wo Contrast  Result Date: 11/20/2022 CLINICAL DATA:  Mental status change, unknown cause. Confusion. Shortness of breath. EXAM: CT HEAD WITHOUT CONTRAST TECHNIQUE: Contiguous axial images were obtained from the base of the skull through the vertex without intravenous contrast. RADIATION DOSE REDUCTION: This exam was performed according to the departmental dose-optimization program which includes automated exposure control, adjustment of the mA and/or kV according to patient size and/or use of iterative reconstruction technique. COMPARISON:  CT scan head from 01/03/2022. FINDINGS:  Brain: No evidence of acute infarction, hemorrhage, hydrocephalus, extra-axial collection or mass lesion/mass effect. There is bilateral periventricular hypodensity, which is non-specific but most likely seen in the settings of microvascular ischemic changes. Mild in extent. Otherwise normal appearance of brain parenchyma. Ventricles are normal. Cerebral volume is age appropriate. Vascular: No hyperdense vessel or unexpected calcification. Intracranial arteriosclerosis. Skull: Normal. Negative for fracture or focal lesion. Sinuses/Orbits: No acute finding. Other: Visualized mastoid air cells are unremarkable. No mastoid effusion. IMPRESSION: *No acute intracranial abnormality. Electronically Signed   By: Jules Schick M.D.   On: 11/20/2022 11:30   DG Chest 2 View  Result Date: 11/20/2022 CLINICAL DATA:  Weakness. Elevated blood pressure. Shortness of breath. EXAM: CHEST - 2 VIEW COMPARISON:  01/03/2022. FINDINGS: There is mild-to-moderate pulmonary vascular congestion. There is small-to-moderate right pleural effusion with probable associated compressive atelectatic changes in the right lung. There is probable small layering left pleural effusion as well. There is an approximately 1.5 x 2.0 cm nodular opacity abutting the left lateral hemidiaphragm, which is incompletely characterized on the current examination but favored to represent atelectasis. Attention on follow-up examination is recommended. Bilateral lung fields are otherwise clear. No pneumothorax. Stable cardio-mediastinal silhouette. There are surgical staples along the heart border and sternotomy wires, status post CABG (coronary artery bypass graft). No acute osseous abnormalities. The soft tissues are within normal limits. IMPRESSION: *Findings compatible with congestive heart failure/pulmonary edema, as described above. *There is a nodular 1.5 x 2.0 cm opacity overlying the left lower lung zone, favored to represent atelectasis. Attention on  follow-up examination is recommended. Electronically Signed   By: Jules Schick M.D.   On: 11/20/2022 11:28    Scheduled Meds:  amLODipine  10 mg Oral Daily   apixaban  5 mg Oral BID   aspirin EC  81 mg Oral Daily   atorvastatin  40 mg Oral Daily   furosemide  40 mg Intravenous BID   hydrALAZINE  50 mg Oral Q8H   insulin aspart  0-5 Units Subcutaneous QHS   insulin aspart  0-9 Units Subcutaneous TID WC   losartan  50 mg Oral Daily   mirtazapine  15 mg Oral QHS   PARoxetine  20 mg Oral Daily   potassium chloride  40 mEq Oral Q6H   sodium chloride flush  3 mL Intravenous Q12H   spironolactone  25 mg Oral Daily   thiamine  200 mg Oral Daily   Continuous Infusions:  sodium chloride       LOS: 0 days    Time spent: 50 mins    Willeen Niece, MD [facility]   If 7PM-7AM, please contact night-coverage

## 2022-11-21 NOTE — Plan of Care (Signed)
  Problem: Clinical Measurements: Goal: Respiratory complications will improve Outcome: Progressing   Problem: Activity: Goal: Risk for activity intolerance will decrease Outcome: Progressing   Problem: Coping: Goal: Level of anxiety will decrease Outcome: Progressing   

## 2022-11-22 DIAGNOSIS — Z87891 Personal history of nicotine dependence: Secondary | ICD-10-CM | POA: Diagnosis not present

## 2022-11-22 DIAGNOSIS — I161 Hypertensive emergency: Secondary | ICD-10-CM | POA: Diagnosis not present

## 2022-11-22 LAB — GLUCOSE, CAPILLARY
Glucose-Capillary: 151 mg/dL — ABNORMAL HIGH (ref 70–99)
Glucose-Capillary: 169 mg/dL — ABNORMAL HIGH (ref 70–99)
Glucose-Capillary: 271 mg/dL — ABNORMAL HIGH (ref 70–99)
Glucose-Capillary: 105 mg/dL — ABNORMAL HIGH (ref 70–99)

## 2022-11-22 LAB — CBC
HCT: 36.8 % — ABNORMAL LOW (ref 39.0–52.0)
Hemoglobin: 12.2 g/dL — ABNORMAL LOW (ref 13.0–17.0)
MCH: 30.1 pg (ref 26.0–34.0)
MCHC: 33.2 g/dL (ref 30.0–36.0)
MCV: 90.9 fL (ref 80.0–100.0)
Platelets: 178 10*3/uL (ref 150–400)
RBC: 4.05 MIL/uL — ABNORMAL LOW (ref 4.22–5.81)
RDW: 14.1 % (ref 11.5–15.5)
WBC: 6.3 10*3/uL (ref 4.0–10.5)
nRBC: 0 % (ref 0.0–0.2)

## 2022-11-22 LAB — MAGNESIUM: Magnesium: 2 mg/dL (ref 1.7–2.4)

## 2022-11-22 MED ORDER — LORAZEPAM 2 MG/ML IJ SOLN
1.0000 mg | Freq: Four times a day (QID) | INTRAMUSCULAR | Status: DC | PRN
  Administered 2022-11-22: 1 mg via INTRAVENOUS
  Filled 2022-11-22: qty 1

## 2022-11-22 MED ORDER — HYDRALAZINE HCL 50 MG PO TABS
100.0000 mg | ORAL_TABLET | Freq: Three times a day (TID) | ORAL | Status: DC
  Administered 2022-11-22 – 2022-11-30 (×17): 100 mg via ORAL
  Filled 2022-11-22 (×21): qty 2

## 2022-11-22 NOTE — Progress Notes (Signed)
Son here at bedside stating this is worse than he has seen him, states he has been similar in the past with a uti. Asks that he can speak to someone. His number is [phone]. MD notified.

## 2022-11-22 NOTE — Consult Note (Signed)
[facility]    Date of Admission:  11/20/2022     Total days of antibiotics 0              Reason for Consult: Endocarditis    Referring Provider: Dr. Idelle Leech Primary Care Provider: [name], [name], PA-C   ASSESSMENT:  [name] [name] is a 67 y/o caucasian male admitted with shortness of breath, edema and confusion and found to have pulmonary edema with TTE showing a nodule on the aortic valve concerning for vegetation/endocarditis. Currently stable with no systemic symptoms and would recommend continuing to monitor off antibiotics pending TEE for confirmation of vegetation. Will obtain blood cultures to ensure there is no underlying bacteremia although unlikely. Additional recommendations pending TEE results. Remaining medical and supportive care per Internal Medicine.   PLAN:  Continue to monitor.  TEE for confirmation of vegetation.  Check blood cultures. Remaining medical and supportive care per Internal Medicine.   I have personally spent 30 minutes involved in face-to-face and non-face-to-face activities for this patient on the day of the visit. Professional time spent includes the following activities: Preparing to see the patient (review of tests), Obtaining and/or reviewing separately obtained history (admission/discharge record), Performing a medically appropriate examination and/or evaluation , Ordering medications/tests/procedures, referring and communicating with other health care professionals, Documenting clinical information in the EMR, Independently interpreting results (not separately reported), Communicating results to the patient/family/caregiver, Counseling and educating the patient/family/caregiver and Care coordination (not separately reported).     Principal Problem:   Hypertensive emergency Active Problems:   Coronary artery disease   Benign prostatic hyperplasia without lower urinary tract symptoms   PAF (paroxysmal atrial  fibrillation) (HCC)   Acute exacerbation of CHF (congestive heart failure) (HCC)   Diabetes mellitus (HCC)   Atrial fibrillation with slow ventricular response (HCC)    amLODipine  10 mg Oral Daily   apixaban  5 mg Oral BID   aspirin EC  81 mg Oral Daily   atorvastatin  40 mg Oral Daily   feeding supplement  237 mL Oral BID BM   furosemide  40 mg Intravenous BID   hydrALAZINE  100 mg Oral Q8H   insulin aspart  0-5 Units Subcutaneous QHS   insulin aspart  0-9 Units Subcutaneous TID WC   insulin glargine-yfgn  8 Units Subcutaneous Daily   losartan  50 mg Oral Daily   mirtazapine  15 mg Oral QHS   multivitamin with minerals  1 tablet Oral Daily   PARoxetine  20 mg Oral Daily   sodium chloride flush  3 mL Intravenous Q12H   spironolactone  25 mg Oral Daily   thiamine  200 mg Oral Daily     HPI: [name] [name] is a 67 y.o. male with previous medical history of Type 2 diabetes on insulin pump, CAD s/p CABG in 2003, hypertension, and paroxysmal atrial fibrillation on Eliquis admitted to the hospital from home with multiple complaints including shortness of breath, confusion, and pedal edema.   [name] [name] is a poor historian and history is primarily obtained from chart review. Found to have hypertensive emergency with elevated blood pressure. CT head obtained for confusion and mental status change with no acute abnormalities. Chest x-ray with findings of pulmonary edema and an area favored to be atelectasis. Afebrile and no leukocytosis. Found to have 1.4 x 1 cm nodule on aortic valve with concern for vegetation. Not currently on antibiotics.  [name] [name] denies any recent fevers, chills,  weight loss or rashes. Obtaining other history is limited secondary to mental status and wanting to eat lunch prior to it getting cold. Nursing adds this is the clearest mental status he has had with symptoms of confusion and pulling on medical devices/equipment in the evenings/nights. ID has been asked  for antibiotic recommendations.     Review of Systems: Review of Systems  Unable to perform ROS: Mental status change     Past Medical History:  Diagnosis Date   Anxiety    Arthritis    Cataract    CHF (congestive heart failure) (HCC)    Constipation    pt states goes EOD- hard stools    Coronary artery disease    Depression    Diabetes mellitus without complication (HCC)    Diabetic retinopathy (HCC)    DKA (diabetic ketoacidoses)    Hyperlipidemia    Hypertension    off meds   Hypertensive retinopathy    Non-intractable vomiting without nausea 06/10/2018   Postherpetic neuralgia 08/11/2021   Speculative diagnosis   Psoriasis    Tubular adenoma of colon 03/2019    Social History   Tobacco Use   Smoking status: Former    Types: Cigars    Quit date: 2022    Years since quitting: 2.8   Smokeless tobacco: Never  Vaping Use   Vaping status: Never Used  Substance Use Topics   Alcohol use: Yes    Comment: about once a year   Drug use: No    Family History  Problem Relation Age of Onset   Lymphoma Father    Cancer Paternal Grandmother        Colon Cancer   Colon cancer Paternal Grandmother    Colon polyps Neg Hx    Esophageal cancer Neg Hx    Rectal cancer Neg Hx    Stomach cancer Neg Hx    Diabetes Mellitus I Neg Hx    Pancreatic cancer Neg Hx     Allergies  Allergen Reactions   Wool Alcohol [Lanolin] Hives and Itching    Wool fabric    OBJECTIVE: Blood pressure (!) 153/84, pulse 61, temperature 97.8 F (36.6 C), temperature source Oral, resp. rate 17, height 6\' 2"  (1.88 m), weight 76.1 kg, SpO2 97%.  Physical Exam Constitutional:      General: He is not in acute distress.    Appearance: He is well-developed.  Cardiovascular:     Rate and Rhythm: Normal rate and regular rhythm.     Heart sounds: Normal heart sounds.  Pulmonary:     Effort: Pulmonary effort is normal.     Breath sounds: Normal breath sounds.  Skin:    General: Skin is warm  and dry.  Neurological:     Mental Status: He is alert.     Lab Results Lab Results  Component Value Date   WBC 6.3 11/22/2022   HGB 12.2 (L) 11/22/2022   HCT 36.8 (L) 11/22/2022   MCV 90.9 11/22/2022   PLT 178 11/22/2022    Lab Results  Component Value Date   CREATININE 1.06 11/21/2022   BUN 14 11/21/2022   NA 137 11/21/2022   K 3.6 11/21/2022   CL 102 11/21/2022   CO2 27 11/21/2022    Lab Results  Component Value Date   ALT 8 11/20/2022   AST 29 11/20/2022   ALKPHOS 87 11/20/2022   BILITOT 0.6 11/20/2022     Microbiology: No results found for this or any previous visit (from the  past 240 hour(s)).   Marcos Eke, NP [facility]  11/22/2022  1:38 PM

## 2022-11-22 NOTE — Plan of Care (Signed)
  Problem: Elimination: Goal: Will not experience complications related to bowel motility Outcome: Progressing   

## 2022-11-22 NOTE — Plan of Care (Signed)
  Problem: Education: Goal: Knowledge of General Education information will improve Description: Including pain rating scale, medication(s)/side effects and non-pharmacologic comfort measures Outcome: Not Progressing   Problem: Health Behavior/Discharge Planning: Goal: Ability to manage health-related needs will improve Outcome: Not Progressing   Problem: Activity: Goal: Risk for activity intolerance will decrease Outcome: Not Progressing   Problem: Coping: Goal: Level of anxiety will decrease Outcome: Not Progressing   

## 2022-11-22 NOTE — Progress Notes (Signed)
Mobility Specialist Progress Note:   11/22/22 1506  Mobility  Activity Stood at bedside  Level of Assistance Minimal assist, patient does 75% or more  Assistive Device Other (Comment) (HHA)  Activity Response Tolerated well  Mobility Referral Yes  $Mobility charge 1 Mobility  Mobility Specialist Start Time (ACUTE ONLY) 1410  Mobility Specialist Stop Time (ACUTE ONLY) 1425  Mobility Specialist Time Calculation (min) (ACUTE ONLY) 15 min   Pt received in bed eager for mobility. Pt requested to put on a pair of pants. Needed MinA during session d/t pts anterior learn. Returned to bed w/ call bell and personal belongings in reach. All needs met w/ call bell and personal belongings in reach. RN in room.   Thompson Grayer Mobility Specialist  Please contact vis Secure Chat or  Rehab Office [phone]

## 2022-11-22 NOTE — Progress Notes (Signed)
Heart Failure Navigator Progress Note  Assessed for Heart & Vascular TOC clinic readiness.  Patient does not meet criteria due to EF 55-60%, has a scheduled [facility] appointment on 12/04/2022.   Navigator will sign off at this time.   Rhae Hammock, BSN, Scientist, clinical (histocompatibility and immunogenetics) Only

## 2022-11-22 NOTE — Progress Notes (Signed)
PROGRESS NOTE    [name] [name]  GLO:[id number] DOB: 07-16-1955 DOA: 11/20/2022 PCP: Bary Leriche, PA-C    Brief Narrative:  This 67 yrs old Male with PMH significant for diabetes mellitus II, on insulin pump for last 3 years, CAD with history of CABG in 2003, hypertension, peripheral vascular disease, paroxysmal A-fib on Eliquis, with slow ventricular response, hyperlipidemia who  was sent by his PCP to ED for uncontrolled blood pressure, he has not been able to take care of himself at home, reports He had 2 episodes of hypoglycemia where he was found unconscious on the floor over last 2 weeks due to poor oral intake, as well patient has not been compliant with medications. He was found to have significantly high blood pressure in the ED 233/102. Chest x-ray significant for increased  vascular congestion >pulmonary edema, Patient required multiple medications for blood pressure control including p.o. clonidine, losartan, IV hydralazine, blood pressure remains elevated, his BNP was  elevated at 262. He was admitted for hypertensive urgency as well as recurrent hypoglycemic episodes.  Patient needs placement   Assessment & Plan:   Principal Problem:   Hypertensive emergency Active Problems:   Coronary artery disease   Benign prostatic hyperplasia without lower urinary tract symptoms   PAF (paroxysmal atrial fibrillation) (HCC)   Acute exacerbation of CHF (congestive heart failure) (HCC)   Diabetes mellitus (HCC)   Atrial fibrillation with slow ventricular response (HCC)  Hypertensive urgency: Patient presented with significantly elevated blood pressure. Most likely due to noncompliance with his medications and diuretics. Continue amlodipine 10 mg daily. Increase hydralazine to 100 mg every 8 hours for better blood pressure control. Continue hydralazine 10 mg IVP every 6 hours as needed. No beta-blockers given known slow ventricular response and low heart rate. Avoid  clonidine due to withdrawals. Continue losartan and spironolactone.  Acute on chronic diastolic CHF: He presented with volume overload on imaging, elevated BNP, most likely provoked due to above. Continue with IV Lasix 40 mg IV twice daily. Continue Aldactone 25 mg dialy. Echo 1/24 : LVEF 60 to 65%, normal LV function. Follow up repeat Echo. Monitor daily weight, intake output charting.  Suspected vegetation: While having TTE patient is found to have suspected vegetation. Infectious disease consulted for further recommendation. Patient may require TEE.  Diabetes mellitus, type II, uncontrolled with hypoglycemia: Son reports his father had 2 episodes of hypoglycemia over the last couple weeks. Not sure at this point if his insulin pump will be the best option, it has already been discontinued while in ED, will place on insulin sliding scale, and likely will try to put on oral agents of possible, as it would be a safer option in this patient who cannot do his insulin pump or injectable insulin as he lives alone with frequent confusions. Diabetic coordinator consult.   Confusion: Son reports he has been more forgetful recently, Acute confusion could be in the setting of hypertensive urgency. Vit B12 WNL, RPR  NR   pA.Fib with slow ventricular response; Not on any heart rate control medication given slow ventricular response. Continue Eliquis.   S/P CABG (coronary artery bypass graft) CAD, CABG in 2003 Continue with aspirin and statin.  Pressure ulcer, POA: Stage 1 on buttock and stage 1 on left heel. Continue with nurse driven pressure ulcer prevention protocol.   Hypomagnesemia: Replaced.  Continue to monitor  Severe deconditioning: PT and OT recommended SNF.  DVT prophylaxis: Eliquis Code Status: Full code Family Communication: No family at bed  side Disposition Plan:   Status is: Inpatient Remains inpatient appropriate because:     Patient admitted for hypertensive  urgency and recurrent hypoglycemic episodes, PT and OT recommended SNF. Patient not able to take care of himself.   Consultants:  None  Procedures: None  Antimicrobials:  Anti-infectives (From admission, onward)    None       Subjective: Patient was seen and examined at bedside.  Overnight events noted.   Patient appears very deconditioned, blood pressure is still elevated. Denies any chest pain.  Heart rate is well-controlled.  Objective: Vitals:   11/22/22 0312 11/22/22 0315 11/22/22 0600 11/22/22 0732  BP:  (!) 164/88 (!) 160/80 (!) 172/86  Pulse:    61  Resp:  15 20 (!) 21  Temp:  97.7 F (36.5 C)  97.6 F (36.4 C)  TempSrc:  Oral  Oral  SpO2:    97%  Weight: 76.1 kg     Height:        Intake/Output Summary (Last 24 hours) at 11/22/2022 1113 Last data filed at 11/22/2022 1044 Gross per 24 hour  Intake 623 ml  Output 1370 ml  Net -747 ml   Filed Weights   11/20/22 1902 11/21/22 0600 11/22/22 0312  Weight: 77.3 kg 74.7 kg 76.1 kg    Examination:  General exam: Appears comfortable, deconditioned, not in any acute distress. Respiratory system: CTA bilaterally. Respiratory effort normal.  RR 14 Cardiovascular system: S1 & S2 heard, RRR. No JVD, murmurs, rubs, gallops or clicks. No pedal edema. Gastrointestinal system: Abdomen is non distended, soft and non tender. Normal bowel sounds heard. Central nervous system: Alert and oriented X 3. No focal neurological deficits. Extremities: No edema, No cyanosis, No clubbing  Skin: No rashes, lesions or ulcers Psychiatry: Judgement and insight appear normal. Mood & affect appropriate.     Data Reviewed: I have personally reviewed following labs and imaging studies  CBC: Recent Labs  Lab 11/20/22 1049 11/21/22 0344 11/22/22 0322  WBC 5.3 5.1 6.3  NEUTROABS 3.3 3.1  --   HGB 12.6* 10.8* 12.2*  HCT 38.9* 32.3* 36.8*  MCV 91.3 89.5 90.9  PLT 158 149* 178   Basic Metabolic Panel: Recent Labs  Lab  11/20/22 1049 11/21/22 0344 11/22/22 0322  NA 138 137  --   K 4.5 3.6  --   CL 103 102  --   CO2 31 27  --   GLUCOSE 99 217*  --   BUN 14 14  --   CREATININE 0.69 1.06  --   CALCIUM 8.5* 7.9*  --   MG  --  1.5* 2.0  PHOS  --  4.2  --    GFR: Estimated Creatinine Clearance: 70.8 mL/min (by C-G formula based on SCr of 1.06 mg/dL). Liver Function Tests: Recent Labs  Lab 11/20/22 1049  AST 29  ALT 8  ALKPHOS 87  BILITOT 0.6  PROT 5.7*  ALBUMIN 2.9*   No results for input(s): "LIPASE", "AMYLASE" in the last 168 hours. No results for input(s): "AMMONIA" in the last 168 hours. Coagulation Profile: No results for input(s): "INR", "PROTIME" in the last 168 hours. Cardiac Enzymes: No results for input(s): "CKTOTAL", "CKMB", "CKMBINDEX", "TROPONINI" in the last 168 hours. BNP (last 3 results) No results for input(s): "PROBNP" in the last 8760 hours. HbA1C: No results for input(s): "HGBA1C" in the last 72 hours. CBG: Recent Labs  Lab 11/21/22 1106 11/21/22 1536 11/21/22 2220 11/22/22 0601 11/22/22 1042  GLUCAP 192* 228* 200*  151* 169*   Lipid Profile: No results for input(s): "CHOL", "HDL", "LDLCALC", "TRIG", "CHOLHDL", "LDLDIRECT" in the last 72 hours. Thyroid Function Tests: Recent Labs    11/20/22 1049  TSH 4.369   Anemia Panel: Recent Labs    11/21/22 0344  VITAMINB12 308   Sepsis Labs: No results for input(s): "PROCALCITON", "LATICACIDVEN" in the last 168 hours.  No results found for this or any previous visit (from the past 240 hour(s)).   Radiology Studies: ECHOCARDIOGRAM COMPLETE  Result Date: 11/21/2022    ECHOCARDIOGRAM REPORT   Patient Name:   LUISMIGUEL BONSALL Date of Exam: 11/21/2022 Medical Rec #:  [id number]            Height:       74.0 in Accession #:    [id number]           Weight:       164.7 lb Date of Birth:  June 05, 1955             BSA:          2.001 m Patient Age:    67 years             BP:           145/81 mmHg Patient Gender: M                     HR:           54 bpm. Exam Location:  Inpatient Procedure: 2D Echo, Color Doppler and Cardiac Doppler Indications:    Pulmonary edema  History:        Patient has prior history of Echocardiogram examinations, most                 recent 01/04/2022. CHF; Arrythmias:Atrial Fibrillation and Atrial                 Flutter.  Sonographer:    Darlys Gales Referring Phys: [id number] DAWOOD S ELGERGAWY IMPRESSIONS  1. Left ventricular ejection fraction, by estimation, is 55 to 60%. The left ventricle has normal function. Left ventricular endocardial border not optimally defined to evaluate regional wall motion. There is mild concentric left ventricular hypertrophy. Left ventricular diastolic parameters are indeterminate.  2. Right ventricular systolic function is mildly reduced. The right ventricular size is mildly enlarged. There is normal pulmonary artery systolic pressure. The estimated right ventricular systolic pressure is 32.6 mmHg.  3. Left atrial size was mildly dilated.  4. Right atrial size was mildly dilated.  5. The mitral valve is normal in structure. Trivial mitral valve regurgitation. No evidence of mitral stenosis.  6. The aortic valve is tricuspid. 1.4 x 1 cm nodule on the noncoronary cusp, concern for vegetation and would suggest TEE evaluation. Aortic valve regurgitation is not visualized. No aortic stenosis is present.  7. Aortic dilatation noted. There is mild dilatation of the ascending aorta, measuring 38 mm.  8. The inferior vena cava is normal in size with <50% respiratory variability, suggesting right atrial pressure of 8 mmHg.  9. Non-sinus rhythm, ?atrial fibrillation FINDINGS  Left Ventricle: Left ventricular ejection fraction, by estimation, is 55 to 60%. The left ventricle has normal function. Left ventricular endocardial border not optimally defined to evaluate regional wall motion. The left ventricular internal cavity size was normal in size. There is mild concentric left ventricular  hypertrophy. Left ventricular diastolic parameters are indeterminate. Right Ventricle: The right ventricular size is mildly enlarged. No increase in right ventricular wall  thickness. Right ventricular systolic function is mildly reduced. There is normal pulmonary artery systolic pressure. The tricuspid regurgitant velocity  is 2.48 m/s, and with an assumed right atrial pressure of 8 mmHg, the estimated right ventricular systolic pressure is 32.6 mmHg. Left Atrium: Left atrial size was mildly dilated. Right Atrium: Right atrial size was mildly dilated. Pericardium: There is no evidence of pericardial effusion. Mitral Valve: The mitral valve is normal in structure. There is mild calcification of the mitral valve leaflet(s). Mild mitral annular calcification. Trivial mitral valve regurgitation. No evidence of mitral valve stenosis. Tricuspid Valve: The tricuspid valve is normal in structure. Tricuspid valve regurgitation is trivial. Aortic Valve: The aortic valve is tricuspid. Aortic valve regurgitation is not visualized. No aortic stenosis is present. Aortic valve mean gradient measures 3.0 mmHg. Aortic valve peak gradient measures 5.0 mmHg. Aortic valve area, by VTI measures 1.95 cm. Pulmonic Valve: The pulmonic valve was normal in structure. Pulmonic valve regurgitation is trivial. Aorta: Aortic dilatation noted. There is mild dilatation of the ascending aorta, measuring 38 mm. Venous: The inferior vena cava is normal in size with less than 50% respiratory variability, suggesting right atrial pressure of 8 mmHg. IAS/Shunts: No atrial level shunt detected by color flow Doppler.  LEFT VENTRICLE PLAX 2D LVIDd:         4.40 cm   Diastology LVIDs:         2.10 cm   LV e' medial:    4.90 cm/s LV PW:         1.20 cm   LV E/e' medial:  21.4 LV IVS:        1.30 cm   LV e' lateral:   6.53 cm/s LVOT diam:     2.00 cm   LV E/e' lateral: 16.1 LV SV:         45 LV SV Index:   22 LVOT Area:     3.14 cm  RIGHT VENTRICLE RV S  prime:     11.70 cm/s TAPSE (M-mode): 1.3 cm LEFT ATRIUM           Index        RIGHT ATRIUM           Index LA Vol (A4C): 74.5 ml 37.23 ml/m  RA Area:     17.10 cm                                    RA Volume:   41.30 ml  20.64 ml/m  AORTIC VALVE AV Area (Vmax):    1.70 cm AV Area (Vmean):   1.86 cm AV Area (VTI):     1.95 cm AV Vmax:           112.00 cm/s AV Vmean:          79.200 cm/s AV VTI:            0.230 m AV Peak Grad:      5.0 mmHg AV Mean Grad:      3.0 mmHg LVOT Vmax:         60.70 cm/s LVOT Vmean:        47.000 cm/s LVOT VTI:          0.143 m LVOT/AV VTI ratio: 0.62  AORTA Ao Root diam: 3.60 cm Ao Asc diam:  3.80 cm MITRAL VALVE  TRICUSPID VALVE MV Area (PHT): 2.55 cm     TR Peak grad:   24.6 mmHg MV Decel Time: 298 msec     TR Vmax:        248.00 cm/s MV E velocity: 105.00 cm/s                             SHUNTS                             Systemic VTI:  0.14 m                             Systemic Diam: 2.00 cm Dalton McleanMD Electronically signed by Wilfred Lacy Signature Date/Time: 11/21/2022/3:07:50 PM    Final    DG Chest 2 View  Result Date: 11/20/2022 CLINICAL DATA:  Weakness. Elevated blood pressure. Shortness of breath. EXAM: CHEST - 2 VIEW COMPARISON:  01/03/2022. FINDINGS: There is mild-to-moderate pulmonary vascular congestion. There is small-to-moderate right pleural effusion with probable associated compressive atelectatic changes in the right lung. There is probable small layering left pleural effusion as well. There is an approximately 1.5 x 2.0 cm nodular opacity abutting the left lateral hemidiaphragm, which is incompletely characterized on the current examination but favored to represent atelectasis. Attention on follow-up examination is recommended. Bilateral lung fields are otherwise clear. No pneumothorax. Stable cardio-mediastinal silhouette. There are surgical staples along the heart border and sternotomy wires, status post CABG (coronary artery  bypass graft). No acute osseous abnormalities. The soft tissues are within normal limits. IMPRESSION: *Findings compatible with congestive heart failure/pulmonary edema, as described above. *There is a nodular 1.5 x 2.0 cm opacity overlying the left lower lung zone, favored to represent atelectasis. Attention on follow-up examination is recommended. Electronically Signed   By: Jules Schick M.D.   On: 11/20/2022 11:28    Scheduled Meds:  amLODipine  10 mg Oral Daily   apixaban  5 mg Oral BID   aspirin EC  81 mg Oral Daily   atorvastatin  40 mg Oral Daily   feeding supplement  237 mL Oral BID BM   furosemide  40 mg Intravenous BID   hydrALAZINE  100 mg Oral Q8H   insulin aspart  0-5 Units Subcutaneous QHS   insulin aspart  0-9 Units Subcutaneous TID WC   insulin glargine-yfgn  8 Units Subcutaneous Daily   losartan  50 mg Oral Daily   mirtazapine  15 mg Oral QHS   multivitamin with minerals  1 tablet Oral Daily   PARoxetine  20 mg Oral Daily   sodium chloride flush  3 mL Intravenous Q12H   spironolactone  25 mg Oral Daily   thiamine  200 mg Oral Daily   Continuous Infusions:   LOS: 1 day    Time spent: 35 mins    Willeen Niece, MD [facility]   If 7PM-7AM, please contact night-coverage

## 2022-11-22 NOTE — Progress Notes (Signed)
Physical Therapy Treatment Patient Details Name: [name] [name] MRN: [id number] DOB: [birth year] Today's Date: 11/22/2022   History of Present Illness Pt is a 67 y.o. male who presented 11/20/22 as referral from his PCP secondary to uncontrolled blood pressure. PMH: A-fib on Eliquis, CAD s/p CABG, PAD, diastolic CHF, DM2 on insulin pump, HTN, HLD, depression    PT Comments  Pt remains confused and potentially hallucinating today. RN notified. He needed repeated cues to keep his hands on the RW when ambulating for his safety. He also needed minA to power up to stand and for balance with serial sit <> stand reps. He demonstrates deficits in strength, balance, and cognition that place him at high risk for falls. Will continue to follow acutely.     If plan is discharge home, recommend the following: A little help with walking and/or transfers;A little help with bathing/dressing/bathroom;Assistance with cooking/housework;Direct supervision/assist for medications management;Direct supervision/assist for financial management;Assist for transportation;Help with stairs or ramp for entrance   Can travel by private vehicle     Yes  Equipment Recommendations  Rolling walker (2 wheels) (if pt does not already have a RW/rollator)    Recommendations for Other Services       Precautions / Restrictions Precautions Precautions: Fall Restrictions Weight Bearing Restrictions: No     Mobility  Bed Mobility Overal bed mobility: Needs Assistance Bed Mobility: Supine to Sit     Supine to sit: HOB elevated, Used rails, Supervision     General bed mobility comments: Supervision for safety    Transfers Overall transfer level: Needs assistance Equipment used: Rolling walker (2 wheels) Transfers: Sit to/from Stand Sit to Stand: Min assist           General transfer comment: MinA for balance transferring to stand from EOB to RW 1x and from recliner to RW 10x     Ambulation/Gait Ambulation/Gait assistance: Min Chemical engineer (Feet): 45 Feet Assistive device: Rolling walker (2 wheels) Gait Pattern/deviations: Step-through pattern, Decreased step length - right, Decreased step length - left, Decreased stride length, Trunk flexed Gait velocity: reduced Gait velocity interpretation: <1.31 ft/sec, indicative of household ambulator   General Gait Details: Pt takes slow, small steps around the room with a flexed posture. Pt needed cues to continue to use the RW as he tried to leave it at one point. Distance limited by fatigue after standing several minutes at sink for pericare and for pt to urinate. He required minA for balance.   Stairs             Wheelchair Mobility     Tilt Bed    Modified Rankin (Stroke Patients Only)       Balance Overall balance assessment: Needs assistance Sitting-balance support: Feet supported, No upper extremity supported Sitting balance-Leahy Scale: Fair   Postural control: Posterior lean Standing balance support: Bilateral upper extremity supported, During functional activity, Reliant on assistive device for balance, No upper extremity supported Standing balance-Leahy Scale: Poor Standing balance comment: reliant on RW to ambulate and posterior lean noted with static standing with intermittent UE support, minA for balance                            Cognition Arousal: Alert Behavior During Therapy: WFL for tasks assessed/performed Overall Cognitive Status: Impaired/Different from baseline Area of Impairment: Attention, Memory, Following commands, Awareness, Safety/judgement, Problem solving, Orientation  Orientation Level: Disoriented to, Time Current Attention Level: Sustained Memory: Decreased short-term memory, Decreased recall of precautions Following Commands: Follows one step commands with increased time, Follows one step commands  consistently Safety/Judgement: Decreased awareness of safety, Decreased awareness of deficits Awareness: Emergent Problem Solving: Slow processing, Difficulty sequencing, Requires verbal cues, Requires tactile cues General Comments: Pt oriented to location, self, and situation but had difficulty with the date. PT provided the month and pt stated "well yesterday was the 20th", asked what that meant today's date was and pt initially stated the "25th" then corrected to the "21st" with cuing. Pt stated the year was 2054 then stated 2064. Pt potentially hallucinating, seeing a "pack of masks" under the bed but PT could not find them (RN notified). Pt needed cues to continue to use the RW as he tried to leave it at one point, stating something along the lines of it making it dangerous for him to walk. Poor comprehension and recall of previously stated info that he could urinate when needed due to primafit on him.        Exercises Other Exercises Other Exercises: sit <> stand 10x from recliner, minA    General Comments        Pertinent Vitals/Pain Pain Assessment Pain Assessment: Faces Faces Pain Scale: No hurt Pain Intervention(s): Monitored during session    Home Living                          Prior Function            PT Goals (current goals can now be found in the care plan section) Acute Rehab PT Goals Patient Stated Goal: to go home PT Goal Formulation: With patient Time For Goal Achievement: 12/05/22 Potential to Achieve Goals: Good Progress towards PT goals: Progressing toward goals    Frequency    Min 1X/week      PT Plan      Co-evaluation              AM-PAC PT "6 Clicks" Mobility   Outcome Measure  Help needed turning from your back to your side while in a flat bed without using bedrails?: A Little Help needed moving from lying on your back to sitting on the side of a flat bed without using bedrails?: A Little Help needed moving to and from  a bed to a chair (including a wheelchair)?: A Little Help needed standing up from a chair using your arms (e.g., wheelchair or bedside chair)?: A Little Help needed to walk in hospital room?: A Little Help needed climbing 3-5 steps with a railing? : A Lot 6 Click Score: 17    End of Session Equipment Utilized During Treatment: Gait belt Activity Tolerance: Patient tolerated treatment well Patient left: in chair;with call bell/phone within reach;with chair alarm set Nurse Communication: Mobility status;Other (comment) (hallucinating?) PT Visit Diagnosis: Unsteadiness on feet (R26.81);Other abnormalities of gait and mobility (R26.89);Muscle weakness (generalized) (M62.81);Difficulty in walking, not elsewhere classified (R26.2);History of falling (Z91.81);Repeated falls (R29.6)     Time: 1610-9604 PT Time Calculation (min) (ACUTE ONLY): 17 min  Charges:    $Therapeutic Activity: 8-22 mins PT General Charges $$ ACUTE PT VISIT: 1 Visit                     Virgil Benedict, PT, DPT Acute Rehabilitation Services  Office: [phone]    Bettina Gavia 11/22/2022, 4:19 PM

## 2022-11-22 NOTE — Progress Notes (Signed)
Patient seeing bugs moving all over foot of bed, tried to reorient and "fix" the problem. Unsuccessful. Patient aggravated and trying to stand up unsteady to leave. PRN order received.   PRN not successful. MD notified. May have exacerbated hallucinations. Patient found eating silverware and butter packets as well as grabbing in the air for things.    Order for sitter received.

## 2022-11-22 NOTE — TOC Progression Note (Addendum)
Transition of Care [facility]) - Progression Note    Patient Details  Name: [name] [name] MRN: [id number] Date of Birth: 1955-08-08  Transition of Care [facility]) CM/SW Contact  Michaela Corner, [place] Phone Number: 11/22/2022, 12:04 PM  Clinical Narrative:   CSW met pt at bedside to discuss PT recs for SNF. Pt expressed understanding of PT recommendation and is agreeable to SNF placement at time. CSW discussed insurance authorization process and will provide Medicare SNF ratings list. CSW sent out referrals for review and will provide pt w/ list of bed offers.   1:33PM: CSW saw pt for food resources. Pt has declined resources at this time.  Expected Discharge Plan: Home w Home Health Services Barriers to Discharge: Continued Medical Work up  Expected Discharge Plan and Services In-house Referral: NA Discharge Planning Services: CM Consult   Living arrangements for the past 2 months: Single Family Home                 DME Arranged: N/A         HH Arranged: RN, PT, Social Work [employer] Agency: [facility] Date HH Agency Contacted: 11/21/22 Time HH Agency Contacted: 1420 Representative spoke with at [facility] Agency: Haywood Lasso   Social Determinants of Health (SDOH) Interventions SDOH Screenings   Food Insecurity: Food Insecurity Present (11/21/2022)  Housing: Low Risk  (11/21/2022)  Transportation Needs: Unmet Transportation Needs (11/21/2022)  Utilities: Not At Risk (11/21/2022)  Alcohol Screen: Low Risk  (10/23/2019)  Depression (PHQ2-9): High Risk (11/21/2022)  Financial Resource Strain: Low Risk  (11/21/2022)  Physical Activity: Inactive (11/21/2022)  Social Connections: Socially Isolated (11/21/2022)  Stress: Stress Concern Present (11/21/2022)  Tobacco Use: Medium Risk (11/21/2022)  Health Literacy: Adequate Health Literacy (11/21/2022)    Readmission Risk Interventions     No data to display

## 2022-11-22 NOTE — NC FL2 (Addendum)
[place] MEDICAID FL2 LEVEL OF CARE FORM     IDENTIFICATION  Patient Name: [name] [name] Birthdate: [birth year] Sex: male Admission Date (Current Location): 11/20/2022  [facility] and [place] Number:  Producer, television/film/video and Address:  [facility] [place], [address], [place], [place] [address]      Provider Number: [id number]  Attending Physician Name and Address:  Willeen Niece, MD  Relative Name and Phone Number:       Current Level of Care: Hospital Recommended Level of Care: Skilled Nursing Facility Prior Approval Number:    Date Approved/Denied:   PASRR Number: [id number]  Discharge Plan: SNF    Current Diagnoses: Patient Active Problem List   Diagnosis Date Noted   Noncompliance 11/20/2022   Requires continuous supervision for activities of daily living (ADL) 11/20/2022   Hypertensive emergency 11/20/2022   Pre-ulcerative calluses 03/05/2022   Protein-calorie malnutrition, severe 01/06/2022   UTI (urinary tract infection) 01/05/2022   Acute urinary retention 01/04/2022   Chronic diastolic CHF (congestive heart failure) (HCC) 01/04/2022   Atrial fibrillation with slow ventricular response (HCC) 01/03/2022   Hallucinations 01/03/2022   Hematuria 01/03/2022   Type 2 diabetes mellitus with proliferative retinopathy of both eyes, with long-term current use of insulin (HCC) 08/18/2021   Type 2 diabetes mellitus with diabetic polyneuropathy, with long-term current use of insulin (HCC) 08/18/2021   Diabetes mellitus (HCC) 08/18/2021   Postherpetic neuralgia 08/11/2021   Blood clotting disorder (HCC) 07/12/2021   Type 1 diabetes mellitus (HCC) 02/16/2021   Chronic diarrhea 02/16/2021   Diarrhea 02/15/2021   Sinus bradycardia 02/15/2021   Acute exacerbation of CHF (congestive heart failure) (HCC) 02/14/2021   Iron deficiency anemia secondary to inadequate dietary iron intake 07/14/2020   Diabetic ulcer of left foot associated with type 1  diabetes mellitus (HCC) 06/30/2020   Pressure injury of skin 05/24/2020   BPH with obstruction/lower urinary tract symptoms 03/16/2020   PAF (paroxysmal atrial fibrillation) (HCC) 03/16/2020   Type 1 diabetes mellitus with diabetic foot infection (HCC) 01/27/2020   Normocytic anemia 01/13/2020   Encounter for general adult medical examination with abnormal findings 01/13/2020   Hyperlipidemia LDL goal <70 01/13/2020   Benign prostatic hyperplasia without lower urinary tract symptoms 01/13/2020   PVD (peripheral vascular disease) (HCC) 01/13/2020   Coronary artery disease involving bypass graft of transplanted heart without angina pectoris 01/13/2020   Paroxysmal atrial fibrillation (HCC) 01/13/2020   Recurrent major depressive disorder, in partial remission (HCC) 01/13/2020   Major depression, recurrent (HCC) 12/09/2019   Tear of lateral meniscus of knee 10/05/2019   Aortic dissection (HCC) 02/05/2019   Coronary artery disease 02/05/2019   Hypertension 02/05/2019   Psoriasis 02/05/2019   Vitamin D deficiency 01/07/2019   Uncontrolled type 2 diabetes mellitus with hyperglycemia (HCC) 01/07/2019   Microalbuminuria 01/07/2019   Depression 06/10/2018   Patellar tendonitis of right knee 04/24/2018   Peyronie's disease 06/09/2015   Erectile dysfunction 03/15/2015   S/P aortic aneurysm repair 05/19/2012   S/P CABG (coronary artery bypass graft) 05/19/2012   Non-proliferative diabetic retinopathy (HCC) 02/27/2011   Contracture of hand joint 11/01/2010    Orientation RESPIRATION BLADDER Height & Weight     Self, Time, Place, Situation  Normal Incontinent, External catheter Weight: 167 lb 12.3 oz (76.1 kg) Height:  6\' 2"  (188 cm)  BEHAVIORAL SYMPTOMS/MOOD NEUROLOGICAL BOWEL NUTRITION STATUS      Incontinent Diet (see dc summary)  AMBULATORY STATUS COMMUNICATION OF NEEDS Skin   Limited Assist  Verbally PU Stage and Appropriate Care (Pressure Injury: Buttocks Right;Left;Mid Stage 1)                        Personal Care Assistance Level of Assistance  Bathing, Feeding, Dressing Bathing Assistance: Limited assistance Feeding Assistance: Limited assistance Dressing Assistance: Limited assistance     Functional Limitations Info  Sight, Speech, Hearing Sight Info: Adequate Hearing Info: Adequate Speech Info: Adequate    SPECIAL CARE FACTORS FREQUENCY  PT (By licensed PT), OT (By licensed OT), Speech therapy     PT Frequency: 5x week OT Frequency: 5x week     Speech Therapy Frequency: 5x week      Contractures Contractures Info: Not present    Additional Factors Info  Code Status, Allergies, Insulin Sliding Scale Code Status Info: Full Allergies Info: Wool Alcohol (lanolin)   Insulin Sliding Scale Info: see dc summary       Current Medications (11/22/2022):  This is the current hospital active medication list Current Facility-Administered Medications  Medication Dose Route Frequency Provider Last Rate Last Admin   acetaminophen (TYLENOL) tablet 650 mg  650 mg Oral Q4H PRN Elgergawy, Leana Roe, MD       amLODipine (NORVASC) tablet 10 mg  10 mg Oral Daily Elgergawy, Leana Roe, MD   10 mg at 11/22/22 1043   apixaban (ELIQUIS) tablet 5 mg  5 mg Oral BID Elgergawy, Leana Roe, MD   5 mg at 11/22/22 1043   aspirin EC tablet 81 mg  81 mg Oral Daily Elgergawy, Leana Roe, MD   81 mg at 11/22/22 1043   atorvastatin (LIPITOR) tablet 40 mg  40 mg Oral Daily Elgergawy, Leana Roe, MD   40 mg at 11/22/22 1042   feeding supplement (ENSURE ENLIVE / ENSURE PLUS) liquid 237 mL  237 mL Oral BID BM Khatri, Pardeep, MD   237 mL at 11/22/22 1044   furosemide (LASIX) injection 40 mg  40 mg Intravenous BID Elgergawy, Leana Roe, MD   40 mg at 11/22/22 1042   hydrALAZINE (APRESOLINE) injection 10 mg  10 mg Intravenous Q4H PRN Elgergawy, Leana Roe, MD       hydrALAZINE (APRESOLINE) tablet 100 mg  100 mg Oral Q8H Khatri, Pardeep, MD       insulin aspart (novoLOG) injection 0-5 Units  0-5  Units Subcutaneous QHS Elgergawy, Leana Roe, MD   3 Units at 11/20/22 2114   insulin aspart (novoLOG) injection 0-9 Units  0-9 Units Subcutaneous TID WC Elgergawy, Leana Roe, MD   2 Units at 11/22/22 1150   insulin glargine-yfgn (SEMGLEE) injection 8 Units  8 Units Subcutaneous Daily Willeen Niece, MD   8 Units at 11/22/22 1043   losartan (COZAAR) tablet 50 mg  50 mg Oral Daily Elgergawy, Leana Roe, MD   50 mg at 11/22/22 1043   mirtazapine (REMERON) tablet 15 mg  15 mg Oral QHS Elgergawy, Leana Roe, MD   15 mg at 11/21/22 2227   multivitamin with minerals tablet 1 tablet  1 tablet Oral Daily Willeen Niece, MD   1 tablet at 11/22/22 1043   ondansetron (ZOFRAN) injection 4 mg  4 mg Intravenous Q6H PRN Elgergawy, Leana Roe, MD       PARoxetine (PAXIL) tablet 20 mg  20 mg Oral Daily Elgergawy, Leana Roe, MD   20 mg at 11/22/22 1042   sodium chloride flush (NS) 0.9 % injection 3 mL  3 mL Intravenous Q12H Elgergawy, Leana Roe, MD  3 mL at 11/22/22 1044   sodium chloride flush (NS) 0.9 % injection 3 mL  3 mL Intravenous PRN Elgergawy, Leana Roe, MD       spironolactone (ALDACTONE) tablet 25 mg  25 mg Oral Daily Elgergawy, Leana Roe, MD   25 mg at 11/22/22 1043   thiamine (VITAMIN B1) tablet 200 mg  200 mg Oral Daily Elgergawy, Leana Roe, MD   200 mg at 11/22/22 1043     Discharge Medications: Please see discharge summary for a list of discharge medications.  Relevant Imaging Results:  Relevant Lab Results:   Additional Information SS#: [id number]  Michaela Corner, LCSWA

## 2022-11-23 ENCOUNTER — Inpatient Hospital Stay (HOSPITAL_COMMUNITY): Payer: PPO

## 2022-11-23 ENCOUNTER — Ambulatory Visit: Payer: Self-pay

## 2022-11-23 DIAGNOSIS — I330 Acute and subacute infective endocarditis: Secondary | ICD-10-CM | POA: Insufficient documentation

## 2022-11-23 DIAGNOSIS — I161 Hypertensive emergency: Secondary | ICD-10-CM | POA: Diagnosis not present

## 2022-11-23 LAB — BASIC METABOLIC PANEL
Anion gap: 7 (ref 5–15)
BUN: 36 mg/dL — ABNORMAL HIGH (ref 8–23)
CO2: 29 mmol/L (ref 22–32)
Calcium: 8.4 mg/dL — ABNORMAL LOW (ref 8.9–10.3)
Chloride: 101 mmol/L (ref 98–111)
Creatinine, Ser: 1.39 mg/dL — ABNORMAL HIGH (ref 0.61–1.24)
GFR, Estimated: 55 mL/min — ABNORMAL LOW (ref >60)
Glucose, Bld: 103 mg/dL — ABNORMAL HIGH (ref 70–99)
Potassium: 4.2 mmol/L (ref 3.5–5.1)
Sodium: 137 mmol/L (ref 135–145)

## 2022-11-23 LAB — RAPID URINE DRUG SCREEN, HOSP PERFORMED
Amphetamines: NOT DETECTED
Barbiturates: NOT DETECTED
Benzodiazepines: NOT DETECTED
Cocaine: NOT DETECTED
Opiates: NOT DETECTED
Tetrahydrocannabinol: NOT DETECTED

## 2022-11-23 LAB — CBC
HCT: 37.6 % — ABNORMAL LOW (ref 39.0–52.0)
Hemoglobin: 12.2 g/dL — ABNORMAL LOW (ref 13.0–17.0)
MCH: 29.6 pg (ref 26.0–34.0)
MCHC: 32.4 g/dL (ref 30.0–36.0)
MCV: 91.3 fL (ref 80.0–100.0)
Platelets: 194 10*3/uL (ref 150–400)
RBC: 4.12 MIL/uL — ABNORMAL LOW (ref 4.22–5.81)
RDW: 14.1 % (ref 11.5–15.5)
WBC: 7.1 10*3/uL (ref 4.0–10.5)
nRBC: 0 % (ref 0.0–0.2)

## 2022-11-23 LAB — MAGNESIUM: Magnesium: 2.1 mg/dL (ref 1.7–2.4)

## 2022-11-23 LAB — VITAMIN B1: Vitamin B1 (Thiamine): 198.4 nmol/L (ref 66.5–200.0)

## 2022-11-23 LAB — GLUCOSE, CAPILLARY
Glucose-Capillary: 117 mg/dL — ABNORMAL HIGH (ref 70–99)
Glucose-Capillary: 121 mg/dL — ABNORMAL HIGH (ref 70–99)
Glucose-Capillary: 123 mg/dL — ABNORMAL HIGH (ref 70–99)
Glucose-Capillary: 200 mg/dL — ABNORMAL HIGH (ref 70–99)
Glucose-Capillary: 253 mg/dL — ABNORMAL HIGH (ref 70–99)

## 2022-11-23 LAB — PHOSPHORUS: Phosphorus: 4.1 mg/dL (ref 2.5–4.6)

## 2022-11-23 MED ORDER — INSULIN ASPART 100 UNIT/ML IJ SOLN
0.0000 [IU] | INTRAMUSCULAR | Status: DC
  Administered 2022-11-24: 2 [IU] via SUBCUTANEOUS
  Administered 2022-11-24 – 2022-11-25 (×5): 1 [IU] via SUBCUTANEOUS
  Administered 2022-11-25: 3 [IU] via SUBCUTANEOUS
  Administered 2022-11-25: 5 [IU] via SUBCUTANEOUS
  Administered 2022-11-25: 3 [IU] via SUBCUTANEOUS
  Administered 2022-11-25: 1 [IU] via SUBCUTANEOUS
  Administered 2022-11-26: 3 [IU] via SUBCUTANEOUS
  Administered 2022-11-26: 2 [IU] via SUBCUTANEOUS
  Administered 2022-11-26: 3 [IU] via SUBCUTANEOUS
  Administered 2022-11-26: 1 [IU] via SUBCUTANEOUS
  Administered 2022-11-26: 3 [IU] via SUBCUTANEOUS
  Administered 2022-11-26: 2 [IU] via SUBCUTANEOUS
  Administered 2022-11-27 (×2): 1 [IU] via SUBCUTANEOUS
  Administered 2022-11-27 (×2): 2 [IU] via SUBCUTANEOUS

## 2022-11-23 MED ORDER — SODIUM CHLORIDE 0.9 % IV BOLUS
500.0000 mL | Freq: Once | INTRAVENOUS | Status: AC
  Administered 2022-11-23: 500 mL via INTRAVENOUS

## 2022-11-23 NOTE — Progress Notes (Signed)
Mobility Specialist Progress Note:   11/23/22 1530  Mobility  Activity Stood at bedside;Ambulated with assistance in room (side steps towars HOB)  Level of Assistance Minimal assist, patient does 75% or more  Assistive Device Other (Comment) (HHA)  Distance Ambulated (ft) 4 ft  Activity Response Tolerated fair  Mobility Referral Yes  $Mobility charge 1 Mobility  Mobility Specialist Start Time (ACUTE ONLY) 1530  Mobility Specialist Stop Time (ACUTE ONLY) 1550  Mobility Specialist Time Calculation (min) (ACUTE ONLY) 20 min   Pt presenting with impaired cognition, agreeable to mobility session. Required minA to get EOB with max cues, minA to stand and take side steps toward HOB. Pt very confused throughout session, constantly changing conversation topics and requiring cues to stay on task. However, when pt understood instructions, he performed them well. Back in bed with all needs met, alarm on.   Addison Lank Mobility Specialist Please contact via SecureChat or  Rehab office at [phone]

## 2022-11-23 NOTE — TOC Progression Note (Signed)
Transition of Care [facility]) - Progression Note    Patient Details  Name: [name] [name] MRN: [id number] Date of Birth: 08-22-1955  Transition of Care [facility]) CM/SW Contact  Michaela Corner, [place] Phone Number: 11/23/2022, 10:19 AM  Clinical Narrative:   CSW spoke with son, [name], about SNF choice. CSW provided [name] with Medicare.gov SNF list and highlighted accepting facilities. [name] asked for time to decide on SNF placement as pt is not medically stable yet.   TOC to follow up.            Skilled Nursing Rehab Facilities-   [email or web address]     Ratings out of 5 stars (5 the highest)    Name Address  Phone # Quality Care Staffing Health Inspection Overall  [facility] [address] [place] [phone] 2 2 5 5   [facility] [address], [place] [phone] 4 2 4 4   [facility] [address], [facility] [phone] 2 1 2 1   [facility] [address] [place] [phone] 3 1 4 3   [facility]  [address], [place] [phone] 4 4 5 5   [facility] [address] [facility] [phone] 3 2 2 2   [facility] [address], [place] [phone] 5 1 2 2   [facility] [phone] [address] [place] [phone] 1 1 3 1   [address] ([place]) [address], [place] [phone] 2 2 2 2   [facility] [address] [place], [place] [phone] 2 2 1 1   [facility] [address], [place] [phone] 3 1 1 1   Eligha Brideg[facility] [address] Liliane Shi [phone] 3 3 4 4   [facility] [address], [place] [phone] 2 2 3 3                  [facility] [address] [place] [phone] 4 2 1 1   [facility], [place] [address], [place] [phone] 1 1 2 1   [facility] [address], [employer] [phone] 2 1 4 3   [facility] [address]  [phone] 2 1 4 3   [facility] [address], [place] [phone] 2 3 3 3                  [address] (no [facility]) [address], [place] [phone] 4 5 5 5   [facility] (No [employer]) [address] [place] [phone] 1 2 4 3   [facility] [address], [place] [phone] 2 1 2 1   [facility] (No [employer]) [address] [place] [phone] 4 1 5 4   [facility] [address] [facility] [phone] 3 4 2 2   [facility] [address] [place] [phone] 2 1 1 1   [facility] [address] Liliane Shi [phone] 4 2 5 5   [facility]  [address], [place] [phone] 2 2 3 3   [facility] [address] [place] [phone] 4 1 1 1   [facility] [address] [place], [place] [phone] 2 2 3 3                  [facility] [address], [place] [phone] 2 1 1 1   [facility] [address], Evlyn Clines  [phone] 3 3 3 3   [facility] (No [employer]) [address] [place] [phone] 2 2 4 4   [facility]) [address], [place]an [phone] 2 1 1  1  [facility] [address], [place] [phone] 4 3 5 5   [facility] [address] [place], [place] [phone] 1 1 1 1                  [facility] [address] [place], [place] [phone] 5 4 5 5   [facility])  [address], [place] [phone] 1 1 2 1   [facility]) [address] [place], [place] [phone]   2 4 4   [facility] [address], [place] [phone] 3 5 5 5   [address] [place], [place] [phone] 4 2 2 2   [facility]) [address] [place] [phone] 2 1 3 2        Expected Discharge Plan: Home w Home Health Services Barriers to Discharge: Continued Medical Work up  Expected Discharge Plan and Services In-house Referral: NA Discharge Planning Services: CM  Consult   Living arrangements for the past 2 months: Single Family Home                 DME Arranged: N/A         HH Arranged: RN, PT, Social Work HH Agency: [facility] Date HH Agency Contacted: 11/21/22 Time HH Agency Contacted: 1420 Representative spoke with at [facility] Agency: Haywood Lasso   Social Determinants of Health (SDOH) Interventions SDOH Screenings   Food Insecurity: Food Insecurity Present (11/21/2022)  Housing: Patient Unable To Answer (11/22/2022)  Transportation Needs: Patient Unable To Answer (11/22/2022)  Recent Concern: Transportation Needs - Unmet Transportation Needs (11/21/2022)  Utilities: Patient Unable To Answer (11/22/2022)  Alcohol Screen: Low Risk  (10/23/2019)  Depression (PHQ2-9): High Risk (11/21/2022)  Financial Resource Strain: Low Risk  (11/21/2022)  Physical Activity: Inactive (11/21/2022)  Social Connections: Socially Isolated (11/21/2022)  Stress: Stress Concern Present (11/21/2022)  Tobacco Use: Medium Risk (11/21/2022)  Health Literacy: Adequate Health Literacy (11/21/2022)    Readmission Risk Interventions     No data to display

## 2022-11-23 NOTE — Consult Note (Signed)
  A new consult has been received for agitation, aggression, and hallucinations. The psychiatry team is scheduled to assess the patient tomorrow. In the meantime, a chart review indicates the patient is hospitalized for suspected aortic valve endocarditis, with an EKG on 11/19 showing a QTc of 534. Given the risks and benefits, benzodiazepine therapy is currently recommended. If agitation and aggression persist, IV Depakene may be considered. A CT scan on admission showed no acute findings. Considering his medical history (DM type 1, proliferative neuropathy, small vessel disease, and cardiac issues), mild cognitive impairment/delirium and/ or multifactorial encephalopathy are possible diagnoses at this time.   Baseline labs and a substance use screen will be obtained tomorrow.   -Repeat EKG and B1 ordered.  - Recommend Ativan 1mg  po TID prn for anxiety. May consider Depakene if unresponsive to ativan.  -Continue work up for encephalopathy -Initiate delirium precautions  -Avoid antipsychotics, Until QTc is < 500.

## 2022-11-23 NOTE — Progress Notes (Signed)
[facility]  Date of Admission:  11/20/2022     Total days of antibiotics 0         ASSESSMENT:  [name] [name] remains afebrile and without any signs of obvious infection in the setting of possible aortic valve endocarditis found on TTE. Awaiting TEE scheduled for Monday, 11/25. Recommend to continue to monitor off antibiotics unless signs/symptoms of infection develop. Remaining medical and supportive care per Internal Medicine.   PLAN:  Continue to monitor off antibiotics. Await TEE results for additional recommendations.  Remaining medical and supportive care per Internal Medicine.  Dr. Daiva Eves is available over the weekend for any ID related questions/concerns.   I have personally spent 24  minutes involved in face-to-face and non-face-to-face activities for this patient on the day of the visit. Professional time spent includes the following activities: Preparing to see the patient (review of tests), Obtaining and/or reviewing separately obtained history (admission/discharge record), Performing a medically appropriate examination and/or evaluation , Ordering medications/tests/procedures, referring and communicating with other health care professionals, Documenting clinical information in the EMR, Independently interpreting results (not separately reported), Communicating results to the patient/family/caregiver, Counseling and educating the patient/family/caregiver and Care coordination (not separately reported).    Principal Problem:   Hypertensive emergency Active Problems:   Coronary artery disease   Benign prostatic hyperplasia without lower urinary tract symptoms   PAF (paroxysmal atrial fibrillation) (HCC)   Acute exacerbation of CHF (congestive heart failure) (HCC)   Diabetes mellitus (HCC)   Atrial fibrillation with slow ventricular response (HCC)    amLODipine  10 mg Oral Daily   apixaban  5 mg Oral BID   aspirin EC  81 mg Oral Daily    atorvastatin  40 mg Oral Daily   feeding supplement  237 mL Oral BID BM   furosemide  40 mg Intravenous BID   hydrALAZINE  100 mg Oral Q8H   insulin aspart  0-5 Units Subcutaneous QHS   insulin aspart  0-9 Units Subcutaneous TID WC   insulin glargine-yfgn  8 Units Subcutaneous Daily   losartan  50 mg Oral Daily   mirtazapine  15 mg Oral QHS   multivitamin with minerals  1 tablet Oral Daily   PARoxetine  20 mg Oral Daily   sodium chloride flush  3 mL Intravenous Q12H   spironolactone  25 mg Oral Daily   thiamine  200 mg Oral Daily    SUBJECTIVE:  Afebrile overnight with no acute events. Lethargic.   Allergies  Allergen Reactions   Wool Alcohol [Lanolin] Hives and Itching    Wool fabric     Review of Systems: Review of Systems  Unable to perform ROS: Mental status change      OBJECTIVE: Vitals:   11/23/22 0500 11/23/22 0543 11/23/22 0753 11/23/22 1110  BP:  (!) 154/77 (!) 147/83 (!) 114/58  Pulse:   60 61  Resp:   17 17  Temp:   97.6 F (36.4 C) 97.6 F (36.4 C)  TempSrc:   Oral Oral  SpO2:   94% 96%  Weight: 73.8 kg     Height:       Body mass index is 20.89 kg/m.  Physical Exam Constitutional:      General: He is not in acute distress.    Appearance: He is well-developed.     Comments: Lying in bed with head of bed elevated; sitter at bedside.   Cardiovascular:     Rate and Rhythm: Normal rate  and regular rhythm.     Heart sounds: Normal heart sounds.  Pulmonary:     Effort: Pulmonary effort is normal.     Breath sounds: Normal breath sounds.  Skin:    General: Skin is warm and dry.  Neurological:     Mental Status: He is alert.     Lab Results Lab Results  Component Value Date   WBC 7.1 11/23/2022   HGB 12.2 (L) 11/23/2022   HCT 37.6 (L) 11/23/2022   MCV 91.3 11/23/2022   PLT 194 11/23/2022    Lab Results  Component Value Date   CREATININE 1.39 (H) 11/23/2022   BUN 36 (H) 11/23/2022   NA 137 11/23/2022   K 4.2 11/23/2022   CL 101  11/23/2022   CO2 29 11/23/2022    Lab Results  Component Value Date   ALT 8 11/20/2022   AST 29 11/20/2022   ALKPHOS 87 11/20/2022   BILITOT 0.6 11/20/2022     Microbiology: Recent Results (from the past 240 hour(s))  Culture, blood (Routine X 2) w Reflex to ID Panel     Status: None (Preliminary result)   Collection Time: 11/22/22  2:49 PM   Specimen: BLOOD RIGHT ARM  Result Value Ref Range Status   Specimen Description BLOOD RIGHT ARM  Final   Special Requests   Final    BOTTLES DRAWN AEROBIC AND ANAEROBIC Blood Culture results may not be optimal due to an inadequate volume of blood received in culture bottles   Culture   Final    NO GROWTH < 24 HOURS Performed at [facility], [address], [place], [place] [address]    Report Status PENDING  Incomplete  Culture, blood (Routine X 2) w Reflex to ID Panel     Status: None (Preliminary result)   Collection Time: 11/22/22  2:51 PM   Specimen: BLOOD RIGHT ARM  Result Value Ref Range Status   Specimen Description BLOOD RIGHT ARM  Final   Special Requests   Final    BOTTLES DRAWN AEROBIC AND ANAEROBIC Blood Culture results may not be optimal due to an inadequate volume of blood received in culture bottles   Culture   Final    NO GROWTH < 24 HOURS Performed at [facility], [address], [place], [place] [address]    Report Status PENDING  Incomplete     Marcos Eke, NP [facility]  11/23/2022  1:22 PM

## 2022-11-23 NOTE — Patient Instructions (Signed)
Visit Information  Thank you for taking time to visit with me today. Please don't hesitate to contact me if I can be of assistance to you.   Following are the goals we discussed today:  Patient son with work with hospital staff on SNF placement.   If you are experiencing a Mental Health or Behavioral Health Crisis or need someone to talk to, please call 911  Patient verbalizes understanding of instructions and care plan provided today and agrees to view in MyChart. Active MyChart status and patient understanding of how to access instructions and care plan via MyChart confirmed with patient.     No further follow up required: Patient is currently in the hospital and receiving services.

## 2022-11-23 NOTE — Progress Notes (Signed)
    [facility] has been asked to complete a TEE on [name] [name] for further evaluation of possible aortic valve vegetation. Patient with acute delirium this admission and is unable to provide consent. Discussed this procedure with patient's son and POA Mccauley Feezor.   Informed Consent   Shared Decision Making/Informed Consent   The risks [esophageal damage, perforation (1:10,000 risk), bleeding, pharyngeal hematoma as well as other potential complications associated with conscious sedation including aspiration, arrhythmia, respiratory failure and death], benefits (treatment guidance and diagnostic support) and alternatives of a transesophageal echocardiogram were discussed in detail with patient's son and he is willing to consent on behalf of his father.

## 2022-11-23 NOTE — Progress Notes (Addendum)
PROGRESS NOTE    [name] [name]  ZOX:[id number] DOB: Dec 30, 1955 DOA: 11/20/2022 PCP: Bary Leriche, PA-C    Brief Narrative:  This 67 yrs old Male with PMH significant for diabetes mellitus II, on insulin pump for last 3 years, CAD with history of CABG in 2003, hypertension, peripheral vascular disease, paroxysmal A-fib on Eliquis, with slow ventricular response, hyperlipidemia who  was sent by his PCP to ED for uncontrolled blood pressure, he has not been able to take care of himself at home, reports He had 2 episodes of hypoglycemia where he was found unconscious on the floor over last 2 weeks due to poor oral intake, as well patient has not been compliant with medications. He was found to have significantly high blood pressure in the ED 233/102. Chest x-ray significant for increased  vascular congestion >pulmonary edema, Patient required multiple medications for blood pressure control including p.o. clonidine, losartan, IV hydralazine, blood pressure remains elevated, his BNP was  elevated at 262. He was admitted for hypertensive urgency as well as recurrent hypoglycemic episodes.  Patient needs placement   Assessment & Plan:   Principal Problem:   Hypertensive emergency Active Problems:   Coronary artery disease   Benign prostatic hyperplasia without lower urinary tract symptoms   PAF (paroxysmal atrial fibrillation) (HCC)   Acute exacerbation of CHF (congestive heart failure) (HCC)   Diabetes mellitus (HCC)   Atrial fibrillation with slow ventricular response (HCC)  Hypertensive urgency: Patient presented with significantly elevated blood pressure. Most likely due to noncompliance with his medications and diuretics. Continue amlodipine 10 mg daily. Continue hydralazine to 100 mg q8hr Continue hydralazine 10 mg IVP every 6 hours as needed. No beta-blockers given known slow ventricular response and low heart rate. Avoid clonidine due to withdrawals. Continue losartan and  spironolactone. BP much improved.  Acute on chronic diastolic CHF: He presented with volume overload on imaging, elevated BNP, most likely provoked due to above. Continue with IV Lasix 40 mg IV twice daily. Continue Aldactone 25 mg dialy. Echo 1/24 : LVEF 60 to 65%, normal LV function. Echo 11/24: LVEF 55-60%, Normal LVF Monitor daily weight, intake output charting.  Suspected vegetation: Patient was found to have suspected vegetation on Aortic valve. Infectious disease consulted for further recommendation. Patient may require TEE. Hold on Antibiotics. Follow up blood cultures  Diabetes mellitus, type II, uncontrolled with hypoglycemia: Son reports his father had 2 episodes of hypoglycemia over the last couple weeks. Not sure at this point if his insulin pump will be the best option, it has already been discontinued while in ED, will place on insulin sliding scale, and likely will try to put on oral agents of possible, as it would be a safer option in this patient who cannot do his insulin pump or injectable insulin as he lives alone with frequent confusions. Diabetic coordinator consult.   Delirium : Son reports he has been more forgetful recently, Acute confusion could be in the setting of hypertensive urgency. Patient reports visual hallucinations, trying to get out of bed. Continue one-to-one sitter. Reorient as able to. Continue Ativan as needed for agitation. Psychiatry consulted for further evaluation.   pA.Fib with slow ventricular response; Not on any heart rate control medication given slow ventricular response. Continue Eliquis.   S/P CABG (coronary artery bypass graft) CAD, CABG in 2003 Continue with aspirin and statin.  Pressure ulcer, POA: Stage 1 on buttock and stage 1 on left heel. Continue with nurse driven pressure ulcer prevention protocol.  Hypomagnesemia: Replaced.  Continue to monitor  Severe deconditioning: PT and OT recommended SNF.  Acute  kidney injury: Baseline serum creatinine normal, up to 1.34 likely prerenal. Continue IV fluid resuscitation, monitor serum creatinine  DVT prophylaxis: Eliquis Code Status: Full code Family Communication:  Spoke with son [name] Disposition Plan:   Status is: Inpatient Remains inpatient appropriate because:    Patient admitted for hypertensive urgency and recurrent hypoglycemic episodes, PT and OT recommended SNF. Patient not able to take care of himself.   Consultants:  None  Procedures: None  Antimicrobials:  Anti-infectives (From admission, onward)    None       Subjective: Patient was seen and examined at bedside.Overnight events noted.   Patient appears very deconditioned, blood pressure has much improved,  denies any chest pain. Patient complains of visual hallucinations,  placed on soft mittens due to agitation and restlessness.   Objective: Vitals:   11/23/22 0500 11/23/22 0543 11/23/22 0753 11/23/22 1110  BP:  (!) 154/77 (!) 147/83 (!) 114/58  Pulse:   60 61  Resp:   17 17  Temp:   97.6 F (36.4 C) 97.6 F (36.4 C)  TempSrc:   Oral Oral  SpO2:   94% 96%  Weight: 73.8 kg     Height:        Intake/Output Summary (Last 24 hours) at 11/23/2022 1250 Last data filed at 11/23/2022 1224 Gross per 24 hour  Intake 598 ml  Output 800 ml  Net -202 ml   Filed Weights   11/21/22 0600 11/22/22 0312 11/23/22 0500  Weight: 74.7 kg 76.1 kg 73.8 kg    Examination:  General exam: Appears comfortable, deconditioned, not in any acute distress. Respiratory system: CTA bilaterally. Respiratory effort normal.  RR 13 Cardiovascular system: S1 & S2 heard, RRR. No JVD, murmurs, rubs, gallops or clicks.  Gastrointestinal system: Abdomen is non distended, soft and non tender. Normal bowel sounds heard. Central nervous system: Alert and oriented X 2. No focal neurological deficits. Extremities: No edema, No cyanosis, No clubbing  Skin: No rashes, lesions or  ulcers Psychiatry: Judgement and insight appear normal. Mood & affect appropriate.     Data Reviewed: I have personally reviewed following labs and imaging studies  CBC: Recent Labs  Lab 11/20/22 1049 11/21/22 0344 11/22/22 0322 11/23/22 0331  WBC 5.3 5.1 6.3 7.1  NEUTROABS 3.3 3.1  --   --   HGB 12.6* 10.8* 12.2* 12.2*  HCT 38.9* 32.3* 36.8* 37.6*  MCV 91.3 89.5 90.9 91.3  PLT 158 149* 178 194   Basic Metabolic Panel: Recent Labs  Lab 11/20/22 1049 11/21/22 0344 11/22/22 0322 11/23/22 0331  NA 138 137  --  137  K 4.5 3.6  --  4.2  CL 103 102  --  101  CO2 31 27  --  29  GLUCOSE 99 217*  --  103*  BUN 14 14  --  36*  CREATININE 0.69 1.06  --  1.39*  CALCIUM 8.5* 7.9*  --  8.4*  MG  --  1.5* 2.0 2.1  PHOS  --  4.2  --  4.1   GFR: Estimated Creatinine Clearance: 52.4 mL/min (A) (by C-G formula based on SCr of 1.39 mg/dL (H)). Liver Function Tests: Recent Labs  Lab 11/20/22 1049  AST 29  ALT 8  ALKPHOS 87  BILITOT 0.6  PROT 5.7*  ALBUMIN 2.9*   No results for input(s): "LIPASE", "AMYLASE" in the last 168 hours. No results for input(s): "AMMONIA"  in the last 168 hours. Coagulation Profile: No results for input(s): "INR", "PROTIME" in the last 168 hours. Cardiac Enzymes: No results for input(s): "CKTOTAL", "CKMB", "CKMBINDEX", "TROPONINI" in the last 168 hours. BNP (last 3 results) No results for input(s): "PROBNP" in the last 8760 hours. HbA1C: No results for input(s): "HGBA1C" in the last 72 hours. CBG: Recent Labs  Lab 11/22/22 1042 11/22/22 1612 11/22/22 2124 11/23/22 0540 11/23/22 1121  GLUCAP 169* 271* 105* 117* 200*   Lipid Profile: No results for input(s): "CHOL", "HDL", "LDLCALC", "TRIG", "CHOLHDL", "LDLDIRECT" in the last 72 hours. Thyroid Function Tests: No results for input(s): "TSH", "T4TOTAL", "FREET4", "T3FREE", "THYROIDAB" in the last 72 hours.  Anemia Panel: Recent Labs    11/21/22 0344  VITAMINB12 308   Sepsis Labs: No  results for input(s): "PROCALCITON", "LATICACIDVEN" in the last 168 hours.  Recent Results (from the past 240 hour(s))  Culture, blood (Routine X 2) w Reflex to ID Panel     Status: None (Preliminary result)   Collection Time: 11/22/22  2:49 PM   Specimen: BLOOD RIGHT ARM  Result Value Ref Range Status   Specimen Description BLOOD RIGHT ARM  Final   Special Requests   Final    BOTTLES DRAWN AEROBIC AND ANAEROBIC Blood Culture results may not be optimal due to an inadequate volume of blood received in culture bottles   Culture   Final    NO GROWTH < 24 HOURS Performed at [facility], [address], [place], [place] [address]    Report Status PENDING  Incomplete  Culture, blood (Routine X 2) w Reflex to ID Panel     Status: None (Preliminary result)   Collection Time: 11/22/22  2:51 PM   Specimen: BLOOD RIGHT ARM  Result Value Ref Range Status   Specimen Description BLOOD RIGHT ARM  Final   Special Requests   Final    BOTTLES DRAWN AEROBIC AND ANAEROBIC Blood Culture results may not be optimal due to an inadequate volume of blood received in culture bottles   Culture   Final    NO GROWTH < 24 HOURS Performed at [facility], [address] [place], [place] [address]    Report Status PENDING  Incomplete     Radiology Studies: ECHOCARDIOGRAM COMPLETE  Result Date: 11/21/2022    ECHOCARDIOGRAM REPORT   Patient Name:   CASIMIRO BLUM Date of Exam: 11/21/2022 Medical Rec #:  [id number]            Height:       74.0 in Accession #:    [id number]           Weight:       164.7 lb Date of Birth:  1955/05/29             BSA:          2.001 m Patient Age:    67 years             BP:           145/81 mmHg Patient Gender: M                    HR:           54 bpm. Exam Location:  Inpatient Procedure: 2D Echo, Color Doppler and Cardiac Doppler Indications:    Pulmonary edema  History:        Patient has prior history of Echocardiogram examinations, most  recent  01/04/2022. CHF; Arrythmias:Atrial Fibrillation and Atrial                 Flutter.  Sonographer:    Darlys Gales Referring Phys: [id number] DAWOOD S ELGERGAWY IMPRESSIONS  1. Left ventricular ejection fraction, by estimation, is 55 to 60%. The left ventricle has normal function. Left ventricular endocardial border not optimally defined to evaluate regional wall motion. There is mild concentric left ventricular hypertrophy. Left ventricular diastolic parameters are indeterminate.  2. Right ventricular systolic function is mildly reduced. The right ventricular size is mildly enlarged. There is normal pulmonary artery systolic pressure. The estimated right ventricular systolic pressure is 32.6 mmHg.  3. Left atrial size was mildly dilated.  4. Right atrial size was mildly dilated.  5. The mitral valve is normal in structure. Trivial mitral valve regurgitation. No evidence of mitral stenosis.  6. The aortic valve is tricuspid. 1.4 x 1 cm nodule on the noncoronary cusp, concern for vegetation and would suggest TEE evaluation. Aortic valve regurgitation is not visualized. No aortic stenosis is present.  7. Aortic dilatation noted. There is mild dilatation of the ascending aorta, measuring 38 mm.  8. The inferior vena cava is normal in size with <50% respiratory variability, suggesting right atrial pressure of 8 mmHg.  9. Non-sinus rhythm, ?atrial fibrillation FINDINGS  Left Ventricle: Left ventricular ejection fraction, by estimation, is 55 to 60%. The left ventricle has normal function. Left ventricular endocardial border not optimally defined to evaluate regional wall motion. The left ventricular internal cavity size was normal in size. There is mild concentric left ventricular hypertrophy. Left ventricular diastolic parameters are indeterminate. Right Ventricle: The right ventricular size is mildly enlarged. No increase in right ventricular wall thickness. Right ventricular systolic function is mildly reduced. There is normal  pulmonary artery systolic pressure. The tricuspid regurgitant velocity  is 2.48 m/s, and with an assumed right atrial pressure of 8 mmHg, the estimated right ventricular systolic pressure is 32.6 mmHg. Left Atrium: Left atrial size was mildly dilated. Right Atrium: Right atrial size was mildly dilated. Pericardium: There is no evidence of pericardial effusion. Mitral Valve: The mitral valve is normal in structure. There is mild calcification of the mitral valve leaflet(s). Mild mitral annular calcification. Trivial mitral valve regurgitation. No evidence of mitral valve stenosis. Tricuspid Valve: The tricuspid valve is normal in structure. Tricuspid valve regurgitation is trivial. Aortic Valve: The aortic valve is tricuspid. Aortic valve regurgitation is not visualized. No aortic stenosis is present. Aortic valve mean gradient measures 3.0 mmHg. Aortic valve peak gradient measures 5.0 mmHg. Aortic valve area, by VTI measures 1.95 cm. Pulmonic Valve: The pulmonic valve was normal in structure. Pulmonic valve regurgitation is trivial. Aorta: Aortic dilatation noted. There is mild dilatation of the ascending aorta, measuring 38 mm. Venous: The inferior vena cava is normal in size with less than 50% respiratory variability, suggesting right atrial pressure of 8 mmHg. IAS/Shunts: No atrial level shunt detected by color flow Doppler.  LEFT VENTRICLE PLAX 2D LVIDd:         4.40 cm   Diastology LVIDs:         2.10 cm   LV e' medial:    4.90 cm/s LV PW:         1.20 cm   LV E/e' medial:  21.4 LV IVS:        1.30 cm   LV e' lateral:   6.53 cm/s LVOT diam:     2.00 cm   LV  E/e' lateral: 16.1 LV SV:         45 LV SV Index:   22 LVOT Area:     3.14 cm  RIGHT VENTRICLE RV S prime:     11.70 cm/s TAPSE (M-mode): 1.3 cm LEFT ATRIUM           Index        RIGHT ATRIUM           Index LA Vol (A4C): 74.5 ml 37.23 ml/m  RA Area:     17.10 cm                                    RA Volume:   41.30 ml  20.64 ml/m  AORTIC VALVE AV Area  (Vmax):    1.70 cm AV Area (Vmean):   1.86 cm AV Area (VTI):     1.95 cm AV Vmax:           112.00 cm/s AV Vmean:          79.200 cm/s AV VTI:            0.230 m AV Peak Grad:      5.0 mmHg AV Mean Grad:      3.0 mmHg LVOT Vmax:         60.70 cm/s LVOT Vmean:        47.000 cm/s LVOT VTI:          0.143 m LVOT/AV VTI ratio: 0.62  AORTA Ao Root diam: 3.60 cm Ao Asc diam:  3.80 cm MITRAL VALVE                TRICUSPID VALVE MV Area (PHT): 2.55 cm     TR Peak grad:   24.6 mmHg MV Decel Time: 298 msec     TR Vmax:        248.00 cm/s MV E velocity: 105.00 cm/s                             SHUNTS                             Systemic VTI:  0.14 m                             Systemic Diam: 2.00 cm Dalton McleanMD Electronically signed by Wilfred Lacy Signature Date/Time: 11/21/2022/3:07:50 PM    Final     Scheduled Meds:  amLODipine  10 mg Oral Daily   apixaban  5 mg Oral BID   aspirin EC  81 mg Oral Daily   atorvastatin  40 mg Oral Daily   feeding supplement  237 mL Oral BID BM   furosemide  40 mg Intravenous BID   hydrALAZINE  100 mg Oral Q8H   insulin aspart  0-5 Units Subcutaneous QHS   insulin aspart  0-9 Units Subcutaneous TID WC   insulin glargine-yfgn  8 Units Subcutaneous Daily   losartan  50 mg Oral Daily   mirtazapine  15 mg Oral QHS   multivitamin with minerals  1 tablet Oral Daily   PARoxetine  20 mg Oral Daily   sodium chloride flush  3 mL Intravenous Q12H   spironolactone  25 mg Oral Daily   thiamine  200 mg Oral  Daily   Continuous Infusions:  sodium chloride      LOS: 2 days    Time spent: 35 mins    Willeen Niece, MD [facility]   If 7PM-7AM, please contact night-coverage

## 2022-11-23 NOTE — Progress Notes (Signed)
Notified by RN that patient had an episode of vomiting at the end of dayshift and was given Zofran.  Then after being given his night meds tonight, he had another episode of vomiting.  Afterwards, he desatted to 88% on room air and was placed on 2 L [place].  No respiratory distress.  Patient is confused and not able to express whether he is having any chest or abdominal pain.  Currently normotensive.  No fever or tachycardia. -Keep n.p.o. at this time given concern for possible aspiration, swallowing evaluation in the morning -Stat chest x-ray and CT abdomen pelvis ordered -Stat EKG and troponin ordered given history of CAD -Continue supplemental oxygen, wean as tolerated -Last CBG 123.  Since patient will be n.p.o., will hold long-acting insulin at this time and change sliding scale insulin to sensitive every 4 hours.

## 2022-11-23 NOTE — Progress Notes (Signed)
Occupational Therapy Treatment Patient Details Name: [name] [name] MRN: [id number] DOB: Oct 17, 1955 Today's Date: 11/23/2022   History of present illness Pt is a 67 y.o. male who presented 11/20/22 as referral from his PCP secondary to uncontrolled blood pressure. PMH: A-fib on Eliquis, CAD s/p CABG, PAD, diastolic CHF, DM2 on insulin pump, HTN, HLD, depression   OT comments  Pt remains limited by significant cognitive and balance deficits. Pt requires Mod A (+2 for safety) for mobility in room, Mod A-Total A for LB ADLs with noted bowel incontinence and consistent assist to sequence basic grooming tasks seated at sink today. Pt with questionable visual impairments to be further assessed based on impaired depth perception during session. Patient will benefit from continued inpatient follow up therapy, <3 hours/day at DC as pt at high risk for falls and safety concerns given current cognition.       If plan is discharge home, recommend the following:  A lot of help with walking and/or transfers;A lot of help with bathing/dressing/bathroom;Assistance with cooking/housework;Assistance with feeding;Direct supervision/assist for medications management;Direct supervision/assist for financial management;Assist for transportation;Help with stairs or ramp for entrance;Supervision due to cognitive status   Equipment Recommendations  Other (comment)    Recommendations for Other Services      Precautions / Restrictions Precautions Precautions: Fall Restrictions Weight Bearing Restrictions: No       Mobility Bed Mobility Overal bed mobility: Needs Assistance Bed Mobility: Supine to Sit     Supine to sit: HOB elevated, Used rails, Supervision          Transfers Overall transfer level: Needs assistance Equipment used: Rolling walker (2 wheels), 1 person hand held assist Transfers: Sit to/from Stand Sit to Stand: Min assist                 Balance Overall balance assessment:  Needs assistance Sitting-balance support: Feet supported, No upper extremity supported Sitting balance-Leahy Scale: Fair     Standing balance support: Bilateral upper extremity supported, During functional activity, Reliant on assistive device for balance, No upper extremity supported Standing balance-Leahy Scale: Poor                             ADL either performed or assessed with clinical judgement   ADL Overall ADL's : Needs assistance/impaired     Grooming: Minimal assistance;Sitting;Oral care Grooming Details (indicate cue type and reason): assist to cue to open toothpaste, place on toothbrush (as pt with impaired depth perception)     Lower Body Bathing: Total assistance;Sit to/from stand Lower Body Bathing Details (indicate cue type and reason): Total A while sitter assisting with peri care after bowel incontinence     Lower Body Dressing: Moderate assistance;Sitting/lateral leans Lower Body Dressing Details (indicate cue type and reason): Attempting to don socks on at wrong end. placed sock on toe area with pt able to then pull up sock EOB. Min A for balance             Functional mobility during ADLs: Moderate assistance;+2 for safety/equipment;Rolling walker (2 wheels)      Extremity/Trunk Assessment Upper Extremity Assessment Upper Extremity Assessment: Generalized weakness;Right hand dominant   Lower Extremity Assessment Lower Extremity Assessment: Defer to PT evaluation        Vision   Vision Assessment?: Vision impaired- to be further tested in functional context Additional Comments: with glasses on, pt still noted with impaired depth perception. difficulty placing toothpaste on toothbrush. undershooting targets  Perception     Praxis      Cognition Arousal: Alert Behavior During Therapy: WFL for tasks assessed/performed, Restless, Impulsive, Flat affect Overall Cognitive Status: Impaired/Different from baseline Area of Impairment:  Attention, Memory, Following commands, Awareness, Safety/judgement, Problem solving, Orientation                 Orientation Level: Disoriented to, Time, Situation Current Attention Level: Sustained Memory: Decreased short-term memory, Decreased recall of precautions Following Commands: Follows one step commands with increased time, Follows one step commands consistently Safety/Judgement: Decreased awareness of safety, Decreased awareness of deficits Awareness: Emergent Problem Solving: Slow processing, Difficulty sequencing, Requires verbal cues, Requires tactile cues General Comments: Pt reports being in [place], [place] and aware of being in medical facility though needed reorientation to [facility]. Pt tangential at times, hallucinating (seeing wallet or diabetic pump in room that was not there). consistent cues for sequencing and problem solving needed.        Exercises      Shoulder Instructions       General Comments      Pertinent Vitals/ Pain       Pain Assessment Pain Assessment: No/denies pain  Home Living                                          Prior Functioning/Environment              Frequency  Min 1X/week        Progress Toward Goals  OT Goals(current goals can now be found in the care plan section)  Progress towards OT goals: Progressing toward goals  Acute Rehab OT Goals Patient Stated Goal: find my wallet OT Goal Formulation: Patient unable to participate in goal setting Time For Goal Achievement: 12/05/22 Potential to Achieve Goals: Fair ADL Goals Pt Will Perform Lower Body Bathing: with supervision;sitting/lateral leans;sit to/from stand Pt Will Perform Lower Body Dressing: with supervision;sitting/lateral leans;sit to/from stand Pt Will Transfer to Toilet: with supervision;ambulating;regular height toilet Pt Will Perform Toileting - Clothing Manipulation and hygiene: with supervision;sitting/lateral leans;sit to/from  stand Additional ADL Goal #1: Patient will be able to complete cognitive assessment without errors in order to return home independently. Additional ADL Goal #2: Patient will be able to demonstrate approrpiate motor planning to complete basic mobility and ADL tasks without need for multi-modal cueing.  Plan      Co-evaluation                 AM-PAC OT "6 Clicks" Daily Activity     Outcome Measure   Help from another person eating meals?: A Little Help from another person taking care of personal grooming?: A Little Help from another person toileting, which includes using toliet, bedpan, or urinal?: A Little Help from another person bathing (including washing, rinsing, drying)?: A Lot Help from another person to put on and taking off regular upper body clothing?: A Little Help from another person to put on and taking off regular lower body clothing?: A Lot 6 Click Score: 16    End of Session Equipment Utilized During Treatment: Gait belt  OT Visit Diagnosis: Unsteadiness on feet (R26.81);Other abnormalities of gait and mobility (R26.89);Repeated falls (R29.6);Muscle weakness (generalized) (M62.81);History of falling (Z91.81);Other symptoms and signs involving cognitive function   Activity Tolerance Patient tolerated treatment well   Patient Left in bed;with call bell/phone within reach;with nursing/sitter in room;Other (comment) (  sitter and RN in room)   Nurse Communication Mobility status        Time: [phone] OT Time Calculation (min): 30 min  Charges: OT General Charges $OT Visit: 1 Visit OT Treatments $Self Care/Home Management : 23-37 mins  Bradd Canary, OTR/L Acute Rehab Services Office: [phone]   Lorre Munroe 11/23/2022, 10:13 AM

## 2022-11-23 NOTE — Progress Notes (Signed)
Speech Language Pathology Treatment: Cognitive-Linquistic  Patient Details Name: [name] [name] MRN: [id number] DOB: 1955-06-19 Today's Date: 11/23/2022 Time: 1147-1209 SLP Time Calculation (min) (ACUTE ONLY): 22 min  Assessment / Plan / Recommendation Clinical Impression  [name] presented with worsened confusion today. Assisted him with self-feeding lunch. Presented with over-reaching and under-reaching for utensils, cup; attempting to set water down beyond edge of bed without table present.  Stated name of apt complex when asked location.  Required constant, max verbal/visual/tactile cues to focus and sustain attention in order to locate food items, load utensils, and bring to mouth. No awareness.  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  Discussed concerns with Dr. Idelle Leech outside room after session.  SLP will follow.   HPI HPI: Pt is a 67 y.o. male who presented 11/20/22 as referral from his PCP secondary to uncontrolled blood pressure. PMH: A-fib on Eliquis, CAD s/p CABG, PAD, diastolic CHF, DM2 on insulin pump, HTN, HLD, depression. CT head      SLP Plan  Continue with current plan of care      Recommendations for follow up therapy are one component of a multi-disciplinary discharge planning process, led by the attending physician.  Recommendations may be updated based on patient status, additional functional criteria and insurance authorization.    Recommendations                         Frequent or constant Supervision/Assistance Cognitive communication deficit (R41.841)     Continue with current plan of care    Tameya Kuznia L. Samson Frederic, MA CCC/SLP Clinical Specialist - Acute Care SLP Acute Rehabilitation Services Office number [phone]  [name]  11/23/2022, 12:11 PM

## 2022-11-23 NOTE — Patient Outreach (Signed)
  Care Coordination   Initial Visit Note   11/23/2022 Name: [name] [name] MRN: [id number] DOB: 18-Apr-1955  [name] [name] is a 67 y.o. year old male who sees [name], [name], PA-C for primary care. I spoke with  Tawny Hopping [employer] son [name] [name] by phone today.  What matters to the patients health and wellness today?  Patient has been in the hospital since 11/20/22.  Staff is working toward placement.    Goals Addressed             This Visit's Progress    Placement       Interventions Today    Flowsheet Row Most Recent Value  General Interventions   General Interventions Discussed/Reviewed General Interventions Discussed, General Interventions Reviewed  [Pt is currently in the hospital and son is waiting for an update on the plan for SNF placement. SW suggest son continue to work with hospital staff and if pt is returned home a [facility] Nurse will reach out and determine needs and if SW is needed.]  Education Interventions   Education Provided Provided Education  [Due to altered mental staff SW does educate on Guardianship as an option if pt is not longer cognitively stable.]              SDOH assessments and interventions completed:  No     Care Coordination Interventions:  Yes, provided   Follow up plan: No further intervention required.   Encounter Outcome:  Patient Visit Completed

## 2022-11-23 NOTE — Consult Note (Signed)
[facility] Liaison Consult Note    11/23/2022  [name] [name] 12-Aug-1955 [id number]  Insurance: [employer]   Primary Care Provider: [name], [name], PA-C with [place] [facility], this provider is listed for the transition of care follow up appointments  and [facility] calls   [facility] Liaison met patient at bedside at [facility]. Patient is in bed noted a bit restless,confused noted. There is no family currently at the bedside. Patient is cooperative with staff.   The patient was screened for  day readmission hospitalization with noted rising medium risk score for unplanned readmission risk.  The patient was assessed for potential [facility] Coordination service with LCSW noted needs for post hospital transition for care coordination. Review of patient's electronic medical record reveals patient is being recommended for SNF.  Noted the VBCI team has out reached noted. Patient will need insurance authorization for SNF .   Plan: [facility] Liaison will continue to follow progress and disposition to asess for post hospital community care coordination/management needs.  Referral request for community care coordination: pending disposition   VBCI Community Care, Population Health does not replace or interfere with any arrangements made by the Inpatient Transition of Care team.   For questions contact:   Charlesetta Shanks, RN, BSN, CCM [place]  [facility], [facility] Liaison Direct Dial: [phone] or secure chat Email: [email or web address]

## 2022-11-24 ENCOUNTER — Inpatient Hospital Stay (HOSPITAL_COMMUNITY): Payer: PPO

## 2022-11-24 DIAGNOSIS — R451 Restlessness and agitation: Secondary | ICD-10-CM

## 2022-11-24 DIAGNOSIS — R443 Hallucinations, unspecified: Secondary | ICD-10-CM | POA: Diagnosis not present

## 2022-11-24 DIAGNOSIS — I161 Hypertensive emergency: Secondary | ICD-10-CM | POA: Diagnosis not present

## 2022-11-24 LAB — BASIC METABOLIC PANEL
Anion gap: 10 (ref 5–15)
BUN: 52 mg/dL — ABNORMAL HIGH (ref 8–23)
CO2: 26 mmol/L (ref 22–32)
Calcium: 8.3 mg/dL — ABNORMAL LOW (ref 8.9–10.3)
Chloride: 102 mmol/L (ref 98–111)
Creatinine, Ser: 2.21 mg/dL — ABNORMAL HIGH (ref 0.61–1.24)
GFR, Estimated: 31 mL/min — ABNORMAL LOW (ref >60)
Glucose, Bld: 164 mg/dL — ABNORMAL HIGH (ref 70–99)
Potassium: 4.7 mmol/L (ref 3.5–5.1)
Sodium: 138 mmol/L (ref 135–145)

## 2022-11-24 LAB — MAGNESIUM: Magnesium: 2 mg/dL (ref 1.7–2.4)

## 2022-11-24 LAB — TROPONIN I (HIGH SENSITIVITY)
Troponin I (High Sensitivity): 39 ng/L — ABNORMAL HIGH (ref <18)
Troponin I (High Sensitivity): 60 ng/L — ABNORMAL HIGH (ref <18)

## 2022-11-24 LAB — CBC
HCT: 33.7 % — ABNORMAL LOW (ref 39.0–52.0)
Hemoglobin: 10.7 g/dL — ABNORMAL LOW (ref 13.0–17.0)
MCH: 29.6 pg (ref 26.0–34.0)
MCHC: 31.8 g/dL (ref 30.0–36.0)
MCV: 93.1 fL (ref 80.0–100.0)
Platelets: 175 10*3/uL (ref 150–400)
RBC: 3.62 MIL/uL — ABNORMAL LOW (ref 4.22–5.81)
RDW: 14.5 % (ref 11.5–15.5)
WBC: 8.9 10*3/uL (ref 4.0–10.5)
nRBC: 0 % (ref 0.0–0.2)

## 2022-11-24 LAB — GLUCOSE, CAPILLARY
Glucose-Capillary: 133 mg/dL — ABNORMAL HIGH (ref 70–99)
Glucose-Capillary: 62 mg/dL — ABNORMAL LOW (ref 70–99)
Glucose-Capillary: 131 mg/dL — ABNORMAL HIGH (ref 70–99)
Glucose-Capillary: 121 mg/dL — ABNORMAL HIGH (ref 70–99)
Glucose-Capillary: 122 mg/dL — ABNORMAL HIGH (ref 70–99)
Glucose-Capillary: 162 mg/dL — ABNORMAL HIGH (ref 70–99)

## 2022-11-24 LAB — PHOSPHORUS: Phosphorus: 5 mg/dL — ABNORMAL HIGH (ref 2.5–4.6)

## 2022-11-24 MED ORDER — DEXTROSE 50 % IV SOLN
12.5000 g | INTRAVENOUS | Status: AC
  Administered 2022-11-24: 12.5 g via INTRAVENOUS

## 2022-11-24 MED ORDER — SODIUM CHLORIDE 0.9 % IV SOLN: INTRAVENOUS | Status: AC

## 2022-11-24 MED ORDER — DEXTROSE 50 % IV SOLN
INTRAVENOUS | Status: AC
  Filled 2022-11-24: qty 50

## 2022-11-24 NOTE — Progress Notes (Signed)
Patient ate about 75% of chocolate pudding and drank 100% of nectar thick apple juice before stating that he didn't want any more. Meds crushed and given with chocolate pudding. Patient left sitting with HOB >60 degrees for 30 minutes prior to lowering.

## 2022-11-24 NOTE — Progress Notes (Signed)
Renal ultrasound being performed at bedside at this time.

## 2022-11-24 NOTE — Evaluation (Signed)
Clinical/Bedside Swallow Evaluation Patient Details  Name: [name] [name] MRN: [id number] Date of Birth: 07/29/1955  Today's Date: 11/24/2022 Time: SLP Start Time (ACUTE ONLY): 1340 SLP Stop Time (ACUTE ONLY): 1354 SLP Time Calculation (min) (ACUTE ONLY): 14 min  Past Medical History:  Past Medical History:  Diagnosis Date   Anxiety    Arthritis    Cataract    CHF (congestive heart failure) (HCC)    Constipation    pt states goes EOD- hard stools    Coronary artery disease    Depression    Diabetes mellitus without complication (HCC)    Diabetic retinopathy (HCC)    DKA (diabetic ketoacidoses)    Hyperlipidemia    Hypertension    off meds   Hypertensive retinopathy    Non-intractable vomiting without nausea 06/10/2018   Postherpetic neuralgia 08/11/2021   Speculative diagnosis   Psoriasis    Tubular adenoma of colon 03/2019   Past Surgical History:  Past Surgical History:  Procedure Laterality Date   ABDOMINAL AORTOGRAM W/LOWER EXTREMITY N/A 08/30/2020   Procedure: ABDOMINAL AORTOGRAM W/LOWER EXTREMITY;  Surgeon: Nada Libman, MD;  Location: [facility];  Service: Cardiovascular;  Laterality: N/A;   ABDOMINAL AORTOGRAM W/LOWER EXTREMITY N/A 11/15/2020   Procedure: ABDOMINAL AORTOGRAM W/LOWER EXTREMITY;  Surgeon: Nada Libman, MD;  Location: [facility]AS[facility] CV [facility];  Service: Cardiovascular;  Laterality: N/A;   ANKLE SURGERY     right   CARDIAC CATHETERIZATION  09/25/2001   COLONOSCOPY     ~10 yr ago- normal  per pt    CORONARY ARTERY BYPASS GRAFT  09/30/2001   CABG x 4   IR THORACENTESIS ASP PLEURAL SPACE W/IMG GUIDE  05/19/2020   LAPAROSCOPIC INGUINAL HERNIA REPAIR Bilateral 02/09/2010   MASS EXCISION Left 03/12/2013   Procedure: LEFT INDEX EXCISION MASS AND DEBRIDMENT DISTAL INTERPHALANGEAL JOINT;  Surgeon: Tami Ribas, MD;  Location: [facility];  Service: Orthopedics;  Laterality: Left;   PERIPHERAL VASCULAR BALLOON ANGIOPLASTY Left  08/30/2020   Procedure: PERIPHERAL VASCULAR BALLOON ANGIOPLASTY;  Surgeon: Nada Libman, MD;  Location: [facility];  Service: Cardiovascular;  Laterality: Left;  Peroneal   HPI:  [name] [name] is a 67 y.o. male who presented 11/20/22 as referral from his PCP secondary to uncontrolled blood pressure. CT head 11/19 with no acute findings.  Pt with presumed aspiration event following episode of vomiting overnight 11/22. CXR 11/23: "Similar findings of pulmonary edema with moderate RIGHT and small  LEFT pleural effusions and favored scattered atelectasis." PMH: A-fib on Eliquis, CAD s/p CABG, PAD, diastolic CHF, DM2 on insulin pump, HTN, HLD, depression.    Assessment / Plan / Recommendation  Clinical Impression  Pt presents with clinical indicators of pharyngeal dysphagia.  With thin liquid there was consistent throat clearing and pt required 4-5 swallows per sip.  With nectar thick liquid there was throat clear after initial trial only and was able to use single swallow or occasionally double swallow.  Pt toelrated puree and regular texture solid.  Pt appeared to exhibit prolonged oral phase, but had acheived adequate oral clearance, but continued with oral movements.  When asked if cracker was gone, pt stated I don't know.  Recommend resuming oral diet with modifications.  Pt may benefit from instrumental swallow study if symtoms persist.    Recommend regular texture diet with nectar thick/mildly thick liquids.   SLP Visit Diagnosis: Dysphagia, oropharyngeal phase (R13.12)    Aspiration Risk  Mild aspiration risk  Diet Recommendation Regular;Nectar-thick liquid    Medication Administration:  (As tolerate, no specific precautions) Supervision: Staff to assist with self feeding Compensations: Slow rate;Small sips/bites Postural Changes: Seated upright at 90 degrees    Other  Recommendations Oral Care Recommendations: Oral care BID    Recommendations for follow up therapy are  one component of a multi-disciplinary discharge planning process, led by the attending physician.  Recommendations may be updated based on patient status, additional functional criteria and insurance authorization.  Follow up Recommendations  (TBD)      Assistance Recommended at Discharge  N/A  Functional Status Assessment Patient has had a recent decline in their functional status and demonstrates the ability to make significant improvements in function in a reasonable and predictable amount of time.  Frequency and Duration min 2x/week  2 weeks       Prognosis Prognosis for improved oropharyngeal function: Good Barriers to Reach Goals: Cognitive deficits      Swallow Study   General Date of Onset: 11/20/22 HPI: [name] [name] is a 67 y.o. male who presented 11/20/22 as referral from his PCP secondary to uncontrolled blood pressure. CT head 11/19 with no acute findings.  Pt with presumed aspiration event following episode of vomiting overnight 11/22. CXR 11/23: "Similar findings of pulmonary edema with moderate RIGHT and small  LEFT pleural effusions and favored scattered atelectasis." PMH: A-fib on Eliquis, CAD s/p CABG, PAD, diastolic CHF, DM2 on insulin pump, HTN, HLD, depression. Type of Study: Bedside Swallow Evaluation Previous Swallow Assessment: None Diet Prior to this Study: NPO Temperature Spikes Noted: No History of Recent Intubation: No Behavior/Cognition: Alert;Cooperative;Requires cueing Oral Cavity Assessment: Within Functional Limits Oral Care Completed by SLP: No Oral Cavity - Dentition: Adequate natural dentition Self-Feeding Abilities: Needs assist Patient Positioning: Partially reclined (Sliding to R even after repositioning) Baseline Vocal Quality: Normal Volitional Cough: Weak Volitional Swallow: Able to elicit    Oral/Motor/Sensory Function Facial ROM:  (could not assess) Facial Symmetry: Within Functional Limits Lingual ROM: Within Functional  Limits Lingual Symmetry: Within Functional Limits Lingual Strength: Within Functional Limits Velum: Within Functional Limits Mandible: Within Functional Limits   Ice Chips Ice chips: Not tested   Thin Liquid Thin Liquid: Impaired Presentation: Straw Pharyngeal  Phase Impairments: Multiple swallows;Throat Clearing - Delayed    Nectar Thick Nectar Thick Liquid: Impaired Pharyngeal Phase Impairments: Throat Clearing - Delayed   Honey Thick Honey Thick Liquid: Not tested   Puree Puree: Within functional limits Presentation: Spoon   Solid     Solid: Within functional limits Presentation:  (SLP fed)      Kerrie Pleasure, MA, CCC-SLP Acute Rehabilitation Services Office: [phone] 11/24/2022,2:11 PM

## 2022-11-24 NOTE — Progress Notes (Addendum)
Dr Loney Loh was made aware that pt vomited after take his night meds and may have aspirated. Oxygen dropped to 88% and pt was placed on [facility]. EKG was done.. CT of chest done. Troponin was drawn. Pt was made NPO and put on CBG q4.

## 2022-11-24 NOTE — Progress Notes (Signed)
PROGRESS NOTE    [name] [name]  ZOX:[id number] DOB: 30-May-1955 DOA: 11/20/2022 PCP: Bary Leriche, PA-C    Brief Narrative:  This 67 yrs old Male with PMH significant for diabetes mellitus II, on insulin pump for last 3 years, CAD with history of CABG in 2003, hypertension, peripheral vascular disease, paroxysmal A-fib on Eliquis, with slow ventricular response, hyperlipidemia who  was sent by his PCP to ED for uncontrolled blood pressure, he has not been able to take care of himself at home, reports He had 2 episodes of hypoglycemia where he was found unconscious on the floor over last 2 weeks due to poor oral intake, as well patient has not been compliant with medications. He was found to have significantly high blood pressure in the ED 233/102. Chest x-ray significant for increased  vascular congestion >pulmonary edema, Patient required multiple medications for blood pressure control including p.o. clonidine, losartan, IV hydralazine, blood pressure remains elevated, his BNP was  elevated at 262. He was admitted for hypertensive urgency as well as recurrent hypoglycemic episodes.  Patient needs placement   Assessment & Plan:   Principal Problem:   Hypertensive emergency Active Problems:   Coronary artery disease   Benign prostatic hyperplasia without lower urinary tract symptoms   PAF (paroxysmal atrial fibrillation) (HCC)   Acute exacerbation of CHF (congestive heart failure) (HCC)   Diabetes mellitus (HCC)   Atrial fibrillation with slow ventricular response (HCC)   Heart valve vegetation  Hypertensive urgency: Patient presented with significantly elevated blood pressure due to non compliance. Continue amlodipine 10 mg daily. Continue hydralazine to 100 mg q8hr Continue hydralazine 10 mg IVP every 6 hours as needed. No beta-blockers given known slow ventricular response and low heart rate. Avoid clonidine due to withdrawals. Continue losartan and spironolactone. BP  much improved.  Acute on chronic diastolic CHF: He presented with volume overload on imaging, elevated BNP, most likely provoked due to above. Continue with IV Lasix 40 mg IV twice daily. Continue Aldactone 25 mg dialy. Echo 1/24 : LVEF 60 to 65%, normal LV function. Echo 11/24: LVEF 55-60%, Normal LVF Monitor daily weight, intake output charting.  Intake/Output Summary (Last 24 hours) at 11/24/2022 1038 Last data filed at 11/24/2022 0432 Gross per 24 hour  Intake 863.18 ml  Output 1200 ml  Net -336.82 ml     Suspected vegetation: Patient was found to have vegetation on Aortic valve. Infectious disease consulted for further recommendation. Hold on Antibiotics for now as there is no leucocytosis, fever. Blood cultures no growth so far Patient is scheduled for TEE on Monday  Diabetes mellitus, type II, uncontrolled with hypoglycemia: Son reports his father had 2 episodes of hypoglycemia over the last couple weeks. Not sure at this point if his insulin pump will be the best option, it has already been discontinued while in ED, will place on insulin sliding scale, and likely will try to put on oral agents of possible, as it would be a safer option in this patient who cannot do his insulin pump or injectable insulin as he lives alone with frequent confusions. Diabetic coordinator consulted.   Delirium : Son reports he has been more forgetful recently, Acute confusion could be in the setting of hypertensive urgency. Patient reports visual hallucinations, trying to get out of bed. Continue Ativan as needed for agitation. Psychiatry consulted for further evaluation.   pA.Fib with slow ventricular response; Not on any heart rate control medication given slow ventricular response. Continue Eliquis.   S/P  CABG (coronary artery bypass graft) CAD, CABG in 2003 Continue with aspirin and statin.  Pressure ulcer, POA: Stage 1 on buttock and stage 1 on left heel. Continue with nurse  driven pressure ulcer prevention protocol.   Hypomagnesemia: Replaced.  Continue to monitor  Severe deconditioning: PT and OT recommended SNF.  Acute kidney injury: Baseline serum creatinine normal, up to 1.34 likely prerenal. Serum creatinine is trending up to 1.39> 2.21 Continue IV fluid resuscitation, monitor serum creatinine Avoid nephrotoxic medications, obtain renal ultrasound.    DVT prophylaxis: Eliquis Code Status: Full code Family Communication:  Spoke with son [name]. Disposition Plan:   Status is: Inpatient Remains inpatient appropriate because:    Patient admitted for hypertensive urgency and recurrent hypoglycemic episodes, PT and OT recommended SNF. Patient not able to take care of himself.   Consultants:  None  Procedures: None  Antimicrobials:  Anti-infectives (From admission, onward)    None       Subjective: Patient was seen and examined at bedside.Overnight events noted.   Patient appears very deconditioned, blood pressure has much improved.  Denies any chest pain. Overnight patient vomited and became hypoxic requiring supplemental oxygen which is weaned down to room air now.   Objective: Vitals:   11/24/22 0020 11/24/22 0427 11/24/22 0430 11/24/22 0745  BP: 121/78  (!) 132/58 (!) 115/49  Pulse: 70  (!) 53 61  Resp: 20  20 11   Temp: 99.1 F (37.3 C)  99 F (37.2 C) 97.9 F (36.6 C)  TempSrc: Axillary  Axillary Oral  SpO2: 92%   92%  Weight:  73.6 kg    Height:        Intake/Output Summary (Last 24 hours) at 11/24/2022 1038 Last data filed at 11/24/2022 0432 Gross per 24 hour  Intake 863.18 ml  Output 1200 ml  Net -336.82 ml   Filed Weights   11/22/22 0312 11/23/22 0500 11/24/22 0427  Weight: 76.1 kg 73.8 kg 73.6 kg    Examination:  General exam: Appears comfortable, deconditioned, not in any acute distress. Respiratory system: CTA bilaterally. Respiratory effort normal.  RR 14 Cardiovascular system: S1 & S2 heard, RRR.  No JVD, murmurs, rubs, gallops or clicks.  Gastrointestinal system: Abdomen is non distended, soft and non tender. Normal bowel sounds heard. Central nervous system: Alert and oriented X 2. No focal neurological deficits. Extremities: No edema, No cyanosis, No clubbing  Skin: No rashes, lesions or ulcers Psychiatry: Mood & affect appropriate.     Data Reviewed: I have personally reviewed following labs and imaging studies  CBC: Recent Labs  Lab 11/20/22 1049 11/21/22 0344 11/22/22 0322 11/23/22 0331 11/24/22 0654  WBC 5.3 5.1 6.3 7.1 8.9  NEUTROABS 3.3 3.1  --   --   --   HGB 12.6* 10.8* 12.2* 12.2* 10.7*  HCT 38.9* 32.3* 36.8* 37.6* 33.7*  MCV 91.3 89.5 90.9 91.3 93.1  PLT 158 149* 178 194 175   Basic Metabolic Panel: Recent Labs  Lab 11/20/22 1049 11/21/22 0344 11/22/22 0322 11/23/22 0331 11/24/22 0654  NA 138 137  --  137 138  K 4.5 3.6  --  4.2 4.7  CL 103 102  --  101 102  CO2 31 27  --  29 26  GLUCOSE 99 217*  --  103* 164*  BUN 14 14  --  36* 52*  CREATININE 0.69 1.06  --  1.39* 2.21*  CALCIUM 8.5* 7.9*  --  8.4* 8.3*  MG  --  1.5* 2.0 2.1  2.0  PHOS  --  4.2  --  4.1 5.0*   GFR: Estimated Creatinine Clearance: 32.8 mL/min (A) (by C-G formula based on SCr of 2.21 mg/dL (H)). Liver Function Tests: Recent Labs  Lab 11/20/22 1049  AST 29  ALT 8  ALKPHOS 87  BILITOT 0.6  PROT 5.7*  ALBUMIN 2.9*   No results for input(s): "LIPASE", "AMYLASE" in the last 168 hours. No results for input(s): "AMMONIA" in the last 168 hours. Coagulation Profile: No results for input(s): "INR", "PROTIME" in the last 168 hours. Cardiac Enzymes: No results for input(s): "CKTOTAL", "CKMB", "CKMBINDEX", "TROPONINI" in the last 168 hours. BNP (last 3 results) No results for input(s): "PROBNP" in the last 8760 hours. HbA1C: No results for input(s): "HGBA1C" in the last 72 hours. CBG: Recent Labs  Lab 11/23/22 2053 11/23/22 2357 11/24/22 0632 11/24/22 0704 11/24/22 0750   GLUCAP 123* 121* 62* 133* 131*   Lipid Profile: No results for input(s): "CHOL", "HDL", "LDLCALC", "TRIG", "CHOLHDL", "LDLDIRECT" in the last 72 hours. Thyroid Function Tests: No results for input(s): "TSH", "T4TOTAL", "FREET4", "T3FREE", "THYROIDAB" in the last 72 hours.  Anemia Panel: No results for input(s): "VITAMINB12", "FOLATE", "FERRITIN", "TIBC", "IRON", "RETICCTPCT" in the last 72 hours.  Sepsis Labs: No results for input(s): "PROCALCITON", "LATICACIDVEN" in the last 168 hours.  Recent Results (from the past 240 hour(s))  Culture, blood (Routine X 2) w Reflex to ID Panel     Status: None (Preliminary result)   Collection Time: 11/22/22  2:49 PM   Specimen: BLOOD RIGHT ARM  Result Value Ref Range Status   Specimen Description BLOOD RIGHT ARM  Final   Special Requests   Final    BOTTLES DRAWN AEROBIC AND ANAEROBIC Blood Culture results may not be optimal due to an inadequate volume of blood received in culture bottles   Culture   Final    NO GROWTH 2 DAYS Performed at [facility], [address] [place], [place] [address]    Report Status PENDING  Incomplete  Culture, blood (Routine X 2) w Reflex to ID Panel     Status: None (Preliminary result)   Collection Time: 11/22/22  2:51 PM   Specimen: BLOOD RIGHT ARM  Result Value Ref Range Status   Specimen Description BLOOD RIGHT ARM  Final   Special Requests   Final    BOTTLES DRAWN AEROBIC AND ANAEROBIC Blood Culture results may not be optimal due to an inadequate volume of blood received in culture bottles   Culture   Final    NO GROWTH 2 DAYS Performed at [facility], [address] [place], [place] [address]    Report Status PENDING  Incomplete     Radiology Studies: No results found.  Scheduled Meds:  amLODipine  10 mg Oral Daily   apixaban  5 mg Oral BID   aspirin EC  81 mg Oral Daily   atorvastatin  40 mg Oral Daily   dextrose       feeding supplement  237 mL Oral BID BM   furosemide  40 mg  Intravenous BID   hydrALAZINE  100 mg Oral Q8H   insulin aspart  0-9 Units Subcutaneous Q4H   losartan  50 mg Oral Daily   mirtazapine  15 mg Oral QHS   multivitamin with minerals  1 tablet Oral Daily   PARoxetine  20 mg Oral Daily   sodium chloride flush  3 mL Intravenous Q12H   spironolactone  25 mg  Oral Daily   thiamine  200 mg Oral Daily   Continuous Infusions:  sodium chloride       LOS: 3 days    Time spent: 35 mins    Willeen Niece, MD [facility]   If 7PM-7AM, please contact night-coverage

## 2022-11-24 NOTE — Progress Notes (Signed)
RN offered patient his dinner 3x tray but he keeps on refusing. 8PM CBG was 162, Insulin 2 units given base on SSC. Will continue to monitor.

## 2022-11-24 NOTE — Progress Notes (Signed)
   11/24/22 1109  Charting Type  Focused Reassessment Changes Noted Neurological  Neurological  Neuro (WDL) X  Orientation Level Oriented to person;Oriented to place;Oriented to situation   Mental status wax and wanes. Blinds open in room. Reorientation provided as need.

## 2022-11-24 NOTE — Progress Notes (Addendum)
1016:Secure chat message sent to provider regarding order for CXR. Per overnight provider progress note, CXR to be ordered but no order placed.  1020: Per MD, can place order for CXR. Does not have to be stat. CXR ordered at this time.

## 2022-11-24 NOTE — Progress Notes (Signed)
Dr. Loney Loh was made aware that pt morning glucose was  62. Dextrose 12.5 grams IV given.[name] [name]

## 2022-11-24 NOTE — Progress Notes (Signed)
Repeat glucose is 133

## 2022-11-24 NOTE — Consult Note (Signed)
[facility] Face-to-Face Psychiatry Consult   Reason for Consult: Agitation and aggression and hallucinations. Referring Physician:  Dr. Willeen Niece Patient Identification: [name] [name] MRN:  [id number] Principal Diagnosis: Hypertensive emergency Diagnosis:  Principal Problem:   Hypertensive emergency Active Problems:   Coronary artery disease   Benign prostatic hyperplasia without lower urinary tract symptoms   PAF (paroxysmal atrial fibrillation) (HCC)   Acute exacerbation of CHF (congestive heart failure) (HCC)   Diabetes mellitus (HCC)   Atrial fibrillation with slow ventricular response (HCC)   Heart valve vegetation   Total Time spent with patient: 1 hour  Subjective:   [name] [name] is a 67 y.o. male patient admitted with hypertensive emergency, and suspected aortic valve endocarditis.   HPI: Chart was reviewed and patient was interviewed.  Patient reports he was admitted for high blood pressure.  Patient was able to state his age is 67 years old and that he is located [facility].  Patient reports that he is doing "pretty good".  Patient reports that he overdid his Eliquis.  Patient was very confused throughout interview and looking around the room during interview.  Patient did not answer questions appropriately, and did not seem to understand questions as well.  Patient denied suicidal ideation, homicidal ideation, and auditory or visual hallucinations.  Patient randomly stated "I cannot drive".  Patient states he lives alone in [place] and that he is not married.  Patient reports he has 5 kids and that they always needs something from him.  It was difficult to obtain a thorough history from patient due to his confusion.  Patient denied any drug or alcohol use.  Past Psychiatric History: Patient denied any significant history, per chart review notes a history of anxiety and depression.  Chart reviewed notes that patient is currently taking Paxil and Remeron.   Patient denied any suicide attempts or psychiatric hospitalizations.  Risk to Self:   Risk to Others:   Prior Inpatient Therapy:   Prior Outpatient Therapy:    Past Medical History:  Past Medical History:  Diagnosis Date   Anxiety    Arthritis    Cataract    CHF (congestive heart failure) (HCC)    Constipation    pt states goes EOD- hard stools    Coronary artery disease    Depression    Diabetes mellitus without complication (HCC)    Diabetic retinopathy (HCC)    DKA (diabetic ketoacidoses)    Hyperlipidemia    Hypertension    off meds   Hypertensive retinopathy    Non-intractable vomiting without nausea 06/10/2018   Postherpetic neuralgia 08/11/2021   Speculative diagnosis   Psoriasis    Tubular adenoma of colon 03/2019    Past Surgical History:  Procedure Laterality Date   ABDOMINAL AORTOGRAM W/LOWER EXTREMITY N/A 08/30/2020   Procedure: ABDOMINAL AORTOGRAM W/LOWER EXTREMITY;  Surgeon: Nada Libman, MD;  Location: [facility];  Service: Cardiovascular;  Laterality: N/A;   ABDOMINAL AORTOGRAM W/LOWER EXTREMITY N/A 11/15/2020   Procedure: ABDOMINAL AORTOGRAM W/LOWER EXTREMITY;  Surgeon: Nada Libman, MD;  Location: [facility];  Service: Cardiovascular;  Laterality: N/A;   ANKLE SURGERY     right   CARDIAC CATHETERIZATION  09/25/2001   COLONOSCOPY     ~10 yr ago- normal  per pt    CORONARY ARTERY BYPASS GRAFT  09/30/2001   CABG x 4   IR THORACENTESIS ASP PLEURAL SPACE W/IMG GUIDE  05/19/2020   LAPAROSCOPIC INGUINAL HERNIA REPAIR Bilateral 02/09/2010  MASS EXCISION Left 03/12/2013   Procedure: LEFT INDEX EXCISION MASS AND DEBRIDMENT DISTAL INTERPHALANGEAL JOINT;  Surgeon: Tami Ribas, MD;  Location: [facility];  Service: Orthopedics;  Laterality: Left;   PERIPHERAL VASCULAR BALLOON ANGIOPLASTY Left 08/30/2020   Procedure: PERIPHERAL VASCULAR BALLOON ANGIOPLASTY;  Surgeon: Nada Libman, MD;  Location: [facility];  Service:  Cardiovascular;  Laterality: Left;  Peroneal   Family History:  Family History  Problem Relation Age of Onset   Lymphoma Father    Cancer Paternal Grandmother        Colon Cancer   Colon cancer Paternal Grandmother    Colon polyps Neg Hx    Esophageal cancer Neg Hx    Rectal cancer Neg Hx    Stomach cancer Neg Hx    Diabetes Mellitus I Neg Hx    Pancreatic cancer Neg Hx    Family Psychiatric  History: Unknown Social History:  Social History   Substance and Sexual Activity  Alcohol Use Yes   Comment: about once a year     Social History   Substance and Sexual Activity  Drug Use No    Social History   Socioeconomic History   Marital status: Divorced    Spouse name: Not on file   Number of children: 4   Years of education: 12   Highest education level: 12th grade  Occupational History   Occupation: Retired  Tobacco Use   Smoking status: Former    Types: Cigars    Quit date: 2022    Years since quitting: 2.8   Smokeless tobacco: Never  Vaping Use   Vaping status: Never Used  Substance and Sexual Activity   Alcohol use: Yes    Comment: about once a year   Drug use: No   Sexual activity: Yes  Other Topics Concern   Not on file  Social History Narrative   Lives with his adult son [name].   Has 4 children, 2 sons- both live in [place], 2 daughters one lives in [place], [place] and youngest daughter attends [employer].    Fiance lives in  [place], [place]   Social Determinants of Health   Financial Resource Strain: Low Risk  (11/21/2022)   Overall Financial Resource Strain (CARDIA)    Difficulty of Paying Living Expenses: Not very hard  Food Insecurity: Food Insecurity Present (11/21/2022)   Hunger Vital Sign    Worried About Running Out of Food in the Last Year: Sometimes true    Ran Out of Food in the Last Year: Sometimes true  Transportation Needs: Patient Unable To Answer (11/22/2022)   PRAPARE - Transportation    Lack of Transportation  (Medical): Patient unable to answer    Lack of Transportation (Non-Medical): Patient unable to answer  Recent Concern: Transportation Needs - Unmet Transportation Needs (11/21/2022)   PRAPARE - Administrator, Civil Service (Medical): Yes    Lack of Transportation (Non-Medical): Yes  Physical Activity: Inactive (11/21/2022)   Exercise Vital Sign    Days of Exercise per Week: 0 days    Minutes of Exercise per Session: 0 min  Stress: Stress Concern Present (11/21/2022)   [employer] of Occupational Health - Occupational Stress Questionnaire    Feeling of Stress : Very much  Social Connections: Socially Isolated (11/21/2022)   Social Connection and Isolation Panel [NHANES]    Frequency of Communication with Friends and Family: Once a week    Frequency of Social Gatherings with Friends  and Family: Once a week    Attends Religious Services: Never    Active Member of Clubs or Organizations: No    Attends Banker Meetings: Never    Marital Status: Divorced   Additional Social History:    Allergies:   Allergies  Allergen Reactions   Wool Alcohol [Lanolin] Hives and Itching    Wool fabric    Labs:  Results for orders placed or performed during the hospital encounter of 11/20/22 (from the past 48 hour(s))  Glucose, capillary     Status: Abnormal   Collection Time: 11/22/22 10:42 AM  Result Value Ref Range   Glucose-Capillary 169 (H) 70 - 99 mg/dL    Comment: Glucose reference range applies only to samples taken after fasting for at least 8 hours.  Culture, blood (Routine X 2) w Reflex to ID Panel     Status: None (Preliminary result)   Collection Time: 11/22/22  2:49 PM   Specimen: BLOOD RIGHT ARM  Result Value Ref Range   Specimen Description BLOOD RIGHT ARM    Special Requests      BOTTLES DRAWN AEROBIC AND ANAEROBIC Blood Culture results may not be optimal due to an inadequate volume of blood received in culture bottles   Culture      NO GROWTH 2  DAYS Performed at [facility], [address], [place], [place] [address]    Report Status PENDING   Culture, blood (Routine X 2) w Reflex to ID Panel     Status: None (Preliminary result)   Collection Time: 11/22/22  2:51 PM   Specimen: BLOOD RIGHT ARM  Result Value Ref Range   Specimen Description BLOOD RIGHT ARM    Special Requests      BOTTLES DRAWN AEROBIC AND ANAEROBIC Blood Culture results may not be optimal due to an inadequate volume of blood received in culture bottles   Culture      NO GROWTH 2 DAYS Performed at [facility], [address] [place], [place] [address]    Report Status PENDING   Glucose, capillary     Status: Abnormal   Collection Time: 11/22/22  4:12 PM  Result Value Ref Range   Glucose-Capillary 271 (H) 70 - 99 mg/dL    Comment: Glucose reference range applies only to samples taken after fasting for at least 8 hours.  Glucose, capillary     Status: Abnormal   Collection Time: 11/22/22  9:24 PM  Result Value Ref Range   Glucose-Capillary 105 (H) 70 - 99 mg/dL    Comment: Glucose reference range applies only to samples taken after fasting for at least 8 hours.  CBC     Status: Abnormal   Collection Time: 11/23/22  3:31 AM  Result Value Ref Range   WBC 7.1 4.0 - 10.5 K/uL   RBC 4.12 (L) 4.22 - 5.81 MIL/uL   Hemoglobin 12.2 (L) 13.0 - 17.0 g/dL   HCT 82.9 (L) 56.2 - 13.0 %   MCV 91.3 80.0 - 100.0 fL   MCH 29.6 26.0 - 34.0 pg   MCHC 32.4 30.0 - 36.0 g/dL   RDW 86.5 78.4 - 69.6 %   Platelets 194 150 - 400 K/uL   nRBC 0.0 0.0 - 0.2 %    Comment: Performed at [facility] Lab, [address], [place], [place] [address]  Magnesium     Status: None   Collection Time: 11/23/22  3:31 AM  Result Value Ref Range  Magnesium 2.1 1.7 - 2.4 mg/dL    Comment: Performed at [facility], [address] [place], [place] [address]  Basic metabolic panel     Status: Abnormal   Collection Time: 11/23/22  3:31 AM  Result Value Ref Range    Sodium 137 135 - 145 mmol/L   Potassium 4.2 3.5 - 5.1 mmol/L   Chloride 101 98 - 111 mmol/L   CO2 29 22 - 32 mmol/L   Glucose, Bld 103 (H) 70 - 99 mg/dL    Comment: Glucose reference range applies only to samples taken after fasting for at least 8 hours.   BUN 36 (H) 8 - 23 mg/dL   Creatinine, Ser 6.04 (H) 0.61 - 1.24 mg/dL   Calcium 8.4 (L) 8.9 - 10.3 mg/dL   GFR, Estimated 55 (L) >60 mL/min    Comment: (NOTE) Calculated using the CKD-EPI Creatinine Equation (2021)    Anion gap 7 5 - 15    Comment: Performed at [facility], [address], [place], [place] [address]  Phosphorus     Status: None   Collection Time: 11/23/22  3:31 AM  Result Value Ref Range   Phosphorus 4.1 2.5 - 4.6 mg/dL    Comment: Performed at [facility], [address] [place], [place] [address]  Glucose, capillary     Status: Abnormal   Collection Time: 11/23/22  5:40 AM  Result Value Ref Range   Glucose-Capillary 117 (H) 70 - 99 mg/dL    Comment: Glucose reference range applies only to samples taken after fasting for at least 8 hours.  Glucose, capillary     Status: Abnormal   Collection Time: 11/23/22 11:21 AM  Result Value Ref Range   Glucose-Capillary 200 (H) 70 - 99 mg/dL    Comment: Glucose reference range applies only to samples taken after fasting for at least 8 hours.  Rapid urine drug screen (hospital performed)     Status: None   Collection Time: 11/23/22  1:36 PM  Result Value Ref Range   Opiates NONE DETECTED NONE DETECTED   Cocaine NONE DETECTED NONE DETECTED   Benzodiazepines NONE DETECTED NONE DETECTED   Amphetamines NONE DETECTED NONE DETECTED   Tetrahydrocannabinol NONE DETECTED NONE DETECTED   Barbiturates NONE DETECTED NONE DETECTED    Comment: (NOTE) DRUG SCREEN FOR MEDICAL PURPOSES ONLY.  IF CONFIRMATION IS NEEDED FOR ANY PURPOSE, NOTIFY LAB WITHIN 5 DAYS.  LOWEST DETECTABLE LIMITS FOR URINE DRUG SCREEN Drug Class                     Cutoff (ng/mL) Amphetamine  and metabolites    1000 Barbiturate and metabolites    200 Benzodiazepine                 200 Opiates and metabolites        300 Cocaine and metabolites        300 THC                            50 Performed at [facility] Lab, [address] [place], [place] [address]   Glucose, capillary     Status: Abnormal   Collection Time: 11/23/22  4:26 PM  Result Value Ref Range   Glucose-Capillary 253 (H) 70 - 99 mg/dL    Comment: Glucose reference range applies only to samples taken after fasting for at least 8 hours.  Glucose, capillary     Status: Abnormal   Collection Time: 11/23/22  8:53 PM  Result Value Ref Range   Glucose-Capillary 123 (H) 70 - 99 mg/dL    Comment: Glucose reference range applies only to samples taken after fasting for at least 8 hours.  Troponin I (High Sensitivity)     Status: Abnormal   Collection Time: 11/23/22 11:56 PM  Result Value Ref Range   Troponin I (High Sensitivity) 39 (H) <18 ng/L    Comment: (NOTE) Elevated high sensitivity troponin I (hsTnI) values and significant  changes across serial measurements may suggest ACS but many other  chronic and acute conditions are known to elevate hsTnI results.  Refer to the "Links" section for chest pain algorithms and additional  guidance. Performed at [facility], [address] [place], [place] [address]   Glucose, capillary     Status: Abnormal   Collection Time: 11/23/22 11:57 PM  Result Value Ref Range   Glucose-Capillary 121 (H) 70 - 99 mg/dL    Comment: Glucose reference range applies only to samples taken after fasting for at least 8 hours.  Glucose, capillary     Status: Abnormal   Collection Time: 11/24/22  6:32 AM  Result Value Ref Range   Glucose-Capillary 62 (L) 70 - 99 mg/dL    Comment: Glucose reference range applies only to samples taken after fasting for at least 8 hours.  CBC     Status: Abnormal   Collection Time: 11/24/22  6:54 AM  Result Value Ref Range   WBC 8.9 4.0 -  10.5 K/uL   RBC 3.62 (L) 4.22 - 5.81 MIL/uL   Hemoglobin 10.7 (L) 13.0 - 17.0 g/dL   HCT 41.3 (L) 24.4 - 01.0 %   MCV 93.1 80.0 - 100.0 fL   MCH 29.6 26.0 - 34.0 pg   MCHC 31.8 30.0 - 36.0 g/dL   RDW 27.2 53.6 - 64.4 %   Platelets 175 150 - 400 K/uL   nRBC 0.0 0.0 - 0.2 %    Comment: Performed at [facility] Lab, [address], [place], [place] [address]  Magnesium     Status: None   Collection Time: 11/24/22  6:54 AM  Result Value Ref Range   Magnesium 2.0 1.7 - 2.4 mg/dL    Comment: Performed at [facility], [address], [place], [place] [address]  Basic metabolic panel     Status: Abnormal   Collection Time: 11/24/22  6:54 AM  Result Value Ref Range   Sodium 138 135 - 145 mmol/L   Potassium 4.7 3.5 - 5.1 mmol/L   Chloride 102 98 - 111 mmol/L   CO2 26 22 - 32 mmol/L   Glucose, Bld 164 (H) 70 - 99 mg/dL    Comment: Glucose reference range applies only to samples taken after fasting for at least 8 hours.   BUN 52 (H) 8 - 23 mg/dL   Creatinine, Ser 3.87 (H) 0.61 - 1.24 mg/dL   Calcium 8.3 (L) 8.9 - 10.3 mg/dL   GFR, Estimated 31 (L) >60 mL/min    Comment: (NOTE) Calculated using the CKD-EPI Creatinine Equation (2021)    Anion gap 10 5 - 15    Comment: Performed at [facility], [address], [place], [place] [address]  Phosphorus     Status: Abnormal   Collection Time: 11/24/22  6:54 AM  Result Value Ref Range   Phosphorus 5.0 (H) 2.5 - 4.6  mg/dL    Comment: Performed at [facility], [address] [place], [place] [address]  Troponin I (High Sensitivity)     Status: Abnormal   Collection Time: 11/24/22  6:54 AM  Result Value Ref Range   Troponin I (High Sensitivity) 60 (H) <18 ng/L    Comment: DELTA CHECK NOTED (NOTE) Elevated high sensitivity troponin I (hsTnI) values and significant  changes across serial measurements may suggest ACS but many other  chronic and acute conditions are known to elevate hsTnI results.  Refer to the "Links"  section for chest pain algorithms and additional  guidance. Performed at [facility], [address] [place], [place] [address]   Glucose, capillary     Status: Abnormal   Collection Time: 11/24/22  7:04 AM  Result Value Ref Range   Glucose-Capillary 133 (H) 70 - 99 mg/dL    Comment: Glucose reference range applies only to samples taken after fasting for at least 8 hours.  Glucose, capillary     Status: Abnormal   Collection Time: 11/24/22  7:50 AM  Result Value Ref Range   Glucose-Capillary 131 (H) 70 - 99 mg/dL    Comment: Glucose reference range applies only to samples taken after fasting for at least 8 hours.    Current Facility-Administered Medications  Medication Dose Route Frequency Provider Last Rate Last Admin   acetaminophen (TYLENOL) tablet 650 mg  650 mg Oral Q4H PRN Elgergawy, Leana Roe, MD       amLODipine (NORVASC) tablet 10 mg  10 mg Oral Daily Elgergawy, Leana Roe, MD   10 mg at 11/23/22 0951   apixaban (ELIQUIS) tablet 5 mg  5 mg Oral BID Elgergawy, Leana Roe, MD   5 mg at 11/23/22 2154   aspirin EC tablet 81 mg  81 mg Oral Daily Elgergawy, Leana Roe, MD   81 mg at 11/23/22 0951   atorvastatin (LIPITOR) tablet 40 mg  40 mg Oral Daily Elgergawy, Leana Roe, MD   40 mg at 11/23/22 0951   dextrose 50 % solution            feeding supplement (ENSURE ENLIVE / ENSURE PLUS) liquid 237 mL  237 mL Oral BID BM Khatri, Pardeep, MD   237 mL at 11/23/22 1331   furosemide (LASIX) injection 40 mg  40 mg Intravenous BID Elgergawy, Leana Roe, MD   40 mg at 11/24/22 0751   hydrALAZINE (APRESOLINE) injection 10 mg  10 mg Intravenous Q4H PRN Elgergawy, Leana Roe, MD       hydrALAZINE (APRESOLINE) tablet 100 mg  100 mg Oral Q8H Khatri, Pardeep, MD   100 mg at 11/23/22 2154   insulin aspart (novoLOG) injection 0-9 Units  0-9 Units Subcutaneous Q4H John Giovanni, MD   1 Units at 11/24/22 0801   LORazepam (ATIVAN) injection 1 mg  1 mg Intravenous Q6H PRN Willeen Niece, MD   1 mg at  11/22/22 1618   losartan (COZAAR) tablet 50 mg  50 mg Oral Daily Elgergawy, Leana Roe, MD   50 mg at 11/23/22 0951   mirtazapine (REMERON) tablet 15 mg  15 mg Oral QHS Elgergawy, Leana Roe, MD   15 mg at 11/23/22 2154   multivitamin with minerals tablet 1 tablet  1 tablet Oral Daily Willeen Niece, MD   1 tablet at 11/23/22 0951   ondansetron (ZOFRAN) injection 4 mg  4 mg Intravenous Q6H PRN Elgergawy, Leana Roe, MD   4 mg at 11/23/22 1843  PARoxetine (PAXIL) tablet 20 mg  20 mg Oral Daily Elgergawy, Leana Roe, MD   20 mg at 11/23/22 0950   sodium chloride flush (NS) 0.9 % injection 3 mL  3 mL Intravenous Q12H Elgergawy, Leana Roe, MD   3 mL at 11/24/22 0751   sodium chloride flush (NS) 0.9 % injection 3 mL  3 mL Intravenous PRN Elgergawy, Leana Roe, MD       spironolactone (ALDACTONE) tablet 25 mg  25 mg Oral Daily Elgergawy, Leana Roe, MD   25 mg at 11/23/22 8295   thiamine (VITAMIN B1) tablet 200 mg  200 mg Oral Daily Elgergawy, Leana Roe, MD   200 mg at 11/23/22 6213    Musculoskeletal: Strength & Muscle Tone:  Patient is currently in bed Gait & Station:  Patient is currently in bed Patient leans:  Patient is currently in bed            Psychiatric Specialty Exam:  Presentation  General Appearance:  Disheveled  Eye Contact: Minimal  Speech: Slow; Blocked  Speech Volume: Decreased  Handedness: Right   Mood and Affect  Mood: Irritable  Affect: Constricted   Thought Process  Thought Processes: Irrevelant; Disorganized  Descriptions of Associations:Loose  Orientation:Partial  Thought Content:Illogical; Scattered  History of Schizophrenia/Schizoaffective disorder:No data recorded Duration of Psychotic Symptoms:No data recorded Hallucinations:Hallucinations: None  Ideas of Reference:None  Suicidal Thoughts:Suicidal Thoughts: No  Homicidal Thoughts:Homicidal Thoughts: No   Sensorium  Memory: Immediate Poor; Recent Poor; Remote  Poor  Judgment: Poor  Insight: Poor   Executive Functions  Concentration: Poor  Attention Span: Poor  Recall: Poor  Fund of Knowledge: Poor  Language: Poor   Psychomotor Activity  Psychomotor Activity: Psychomotor Activity: Restlessness   Assets  Assets:No data recorded  Sleep  Sleep: Sleep: Poor   Physical Exam: Physical Exam Vitals and nursing note reviewed.    Review of Systems  All other systems reviewed and are negative.  Blood pressure (!) 115/49, pulse 61, temperature 97.9 F (36.6 C), temperature source Oral, resp. rate 11, height 6\' 2"  (1.88 m), weight 73.6 kg, SpO2 92%. Body mass index is 20.83 kg/m.  Treatment Plan Summary: Daily contact with patient to assess and evaluate symptoms and progress in treatment and Plan see below  Patient is hospitalized for suspected aortic valve endocarditis, with an EKG on 11/19 showing a QTc of 534. Given the risks and benefits, benzodiazepine therapy is currently recommended. If agitation and aggression persist, IV Depakene may be considered. A CT scan on admission showed no acute findings. Considering his medical history (DM type 1, proliferative neuropathy, small vessel disease, and cardiac issues), mild cognitive impairment/delirium and/ or multifactorial encephalopathy are possible diagnoses at this time.    Substance use screen was negative.    -Repeat EKG today showed QTC 428, which has improved - Recommend Ativan 1mg  po TID prn for anxiety. May consider Depakene if unresponsive to ativan.  -Continue work up for encephalopathy -Initiate delirium precautions  -Avoid antipsychotics for now due to medical comorbidities. -Continue patient's home psych medications of Paxil and Remeron for now.   Disposition: Patient does not meet criteria for psychiatric inpatient admission. Discussed crisis plan, support from social network, calling 911, coming to the Emergency Department, and calling Suicide  Hotline.  Ancil Linsey, MD 11/24/2022 10:29 AM

## 2022-11-24 NOTE — Plan of Care (Signed)

## 2022-11-24 NOTE — Progress Notes (Signed)
Patient able to take his pills and consumed 100% chocolate pudding. RN able to convince patient to eat his sliced fruits on his tray.

## 2022-11-25 DIAGNOSIS — R443 Hallucinations, unspecified: Secondary | ICD-10-CM | POA: Diagnosis not present

## 2022-11-25 DIAGNOSIS — I161 Hypertensive emergency: Secondary | ICD-10-CM | POA: Diagnosis not present

## 2022-11-25 DIAGNOSIS — R451 Restlessness and agitation: Secondary | ICD-10-CM | POA: Diagnosis not present

## 2022-11-25 LAB — GLUCOSE, CAPILLARY
Glucose-Capillary: 104 mg/dL — ABNORMAL HIGH (ref 70–99)
Glucose-Capillary: 124 mg/dL — ABNORMAL HIGH (ref 70–99)
Glucose-Capillary: 131 mg/dL — ABNORMAL HIGH (ref 70–99)
Glucose-Capillary: 225 mg/dL — ABNORMAL HIGH (ref 70–99)
Glucose-Capillary: 232 mg/dL — ABNORMAL HIGH (ref 70–99)
Glucose-Capillary: 260 mg/dL — ABNORMAL HIGH (ref 70–99)

## 2022-11-25 LAB — BASIC METABOLIC PANEL WITH GFR
Anion gap: 10 (ref 5–15)
BUN: 48 mg/dL — ABNORMAL HIGH (ref 8–23)
CO2: 27 mmol/L (ref 22–32)
Calcium: 8.1 mg/dL — ABNORMAL LOW (ref 8.9–10.3)
Chloride: 102 mmol/L (ref 98–111)
Creatinine, Ser: 1.83 mg/dL — ABNORMAL HIGH (ref 0.61–1.24)
GFR, Estimated: 39 mL/min — ABNORMAL LOW
Glucose, Bld: 128 mg/dL — ABNORMAL HIGH (ref 70–99)
Potassium: 4 mmol/L (ref 3.5–5.1)
Sodium: 139 mmol/L (ref 135–145)

## 2022-11-25 NOTE — Anesthesia Preprocedure Evaluation (Addendum)
Anesthesia Evaluation  Patient identified by MRN, date of birth, ID band Patient awake    Reviewed: Allergy & Precautions, NPO status , Patient's Chart, lab work & pertinent test results, reviewed documented beta blocker date and time   Airway Mallampati: II       Dental  (+) Dental Advisory Given   Pulmonary former smoker   breath sounds clear to auscultation + decreased breath sounds      Cardiovascular hypertension, Pt. on medications + CAD, + CABG, + Peripheral Vascular Disease and +CHF  + dysrhythmias Atrial Fibrillation  Rhythm:Irregular Rate:Normal  TTE 2024 1. Left ventricular ejection fraction, by estimation, is 55 to 60%. The  left ventricle has normal function. Left ventricular endocardial border  not optimally defined to evaluate regional wall motion. There is mild  concentric left ventricular  hypertrophy. Left ventricular diastolic parameters are indeterminate.   2. Right ventricular systolic function is mildly reduced. The right  ventricular size is mildly enlarged. There is normal pulmonary artery  systolic pressure. The estimated right ventricular systolic pressure is  32.6 mmHg.   3. Left atrial size was mildly dilated.   4. Right atrial size was mildly dilated.   5. The mitral valve is normal in structure. Trivial mitral valve  regurgitation. No evidence of mitral stenosis.   6. The aortic valve is tricuspid. 1.4 x 1 cm nodule on the noncoronary  cusp, concern for vegetation and would suggest TEE evaluation. Aortic  valve regurgitation is not visualized. No aortic stenosis is present.   7. Aortic dilatation noted. There is mild dilatation of the ascending  aorta, measuring 38 mm.   8. The inferior vena cava is normal in size with <50% respiratory  variability, suggesting right atrial pressure of 8 mmHg.   9. Non-sinus rhythm, ?atrial fibrillation     Neuro/Psych  PSYCHIATRIC DISORDERS Anxiety Depression     Diabetic retinopathy  Neuromuscular disease    GI/Hepatic   Endo/Other  diabetes, Poorly Controlled, Type 2, Insulin Dependent  HLD  Renal/GU Renal InsufficiencyRenal disease     Musculoskeletal  (+) Arthritis , Osteoarthritis,    Abdominal   Peds  Hematology  (+) Blood dyscrasia (eliquis), anemia Eliquist therapy   Anesthesia Other Findings Bacteremia, aortic valve vegetation  PMH significant for diabetes mellitus II, on insulin pump for last 3 years, CAD with history of CABG in 2003, hypertension, peripheral vascular disease, paroxysmal A-fib on Eliquis, with slow ventricular response, hyperlipidemia who is admitted for hypertensive urgency as well as recurrent hypoglycemic episodes   Reproductive/Obstetrics                              Anesthesia Physical Anesthesia Plan  ASA: 3  Anesthesia Plan: MAC   Post-op Pain Management:    Induction: Intravenous  PONV Risk Score and Plan: Propofol infusion and Treatment may vary due to age or medical condition  Airway Management Planned: Natural Airway and Nasal Cannula  Additional Equipment: None  Intra-op Plan:   Post-operative Plan:   Informed Consent: I have reviewed the patients History and Physical, chart, labs and discussed the procedure including the risks, benefits and alternatives for the proposed anesthesia with the patient or authorized representative who has indicated his/her understanding and acceptance.     Dental advisory given  Plan Discussed with: CRNA and Anesthesiologist  Anesthesia Plan Comments:         Anesthesia Quick Evaluation

## 2022-11-25 NOTE — H&P (View-Only) (Signed)
PROGRESS NOTE    [name] [name]  GNF:[id number] DOB: 08-11-1955 DOA: 11/20/2022 PCP: Bary Leriche, PA-C    Brief Narrative:  This 67 yrs old Male with PMH significant for diabetes mellitus II, on insulin pump for last 3 years, CAD with history of CABG in 2003, hypertension, peripheral vascular disease, paroxysmal A-fib on Eliquis, with slow ventricular response, hyperlipidemia who  was sent by his PCP to ED for uncontrolled blood pressure, he has not been able to take care of himself at home, reports He had 2 episodes of hypoglycemia where he was found unconscious on the floor over last 2 weeks due to poor oral intake, as well patient has not been compliant with medications. He was found to have significantly high blood pressure in the ED 233/102. Chest x-ray significant for increased  vascular congestion >pulmonary edema, Patient required multiple medications for blood pressure control including p.o. clonidine, losartan, IV hydralazine, blood pressure remains elevated, his BNP was  elevated at 262. He was admitted for hypertensive urgency as well as recurrent hypoglycemic episodes.  Patient needs placement   Assessment & Plan:   Principal Problem:   Hypertensive emergency Active Problems:   Coronary artery disease   Benign prostatic hyperplasia without lower urinary tract symptoms   PAF (paroxysmal atrial fibrillation) (HCC)   Acute exacerbation of CHF (congestive heart failure) (HCC)   Diabetes mellitus (HCC)   Atrial fibrillation with slow ventricular response (HCC)   Heart valve vegetation  Hypertensive urgency: Patient presented with significantly elevated blood pressure due to non compliance. Continue amlodipine 10 mg daily. Continue hydralazine to 100 mg q8hr Continue hydralazine 10 mg IVP every 6 hours as needed. No beta-blockers given known slow ventricular response and low heart rate. Avoid clonidine due to withdrawals. Continue losartan and spironolactone. BP  much improved.  Acute on chronic diastolic CHF: He presented with volume overload on imaging, elevated BNP, most likely provoked due to above. Continue with IV Lasix 40 mg IV twice daily. Continue Aldactone 25 mg dialy. Echo 1/24 : LVEF 60 to 65%, normal LV function. Echo 11/24: LVEF 55-60%, Normal LVF Monitor daily weight, intake output charting.  Intake/Output Summary (Last 24 hours) at 11/25/2022 1310 Last data filed at 11/25/2022 1230 Gross per 24 hour  Intake 1290.4 ml  Output 1150 ml  Net 140.4 ml     Suspected vegetation: Patient was found to have vegetation on Aortic valve. Infectious disease consulted for further recommendation. Hold on Antibiotics for now as there is no leucocytosis,  No fever. Blood cultures no growth so far Patient is scheduled for TEE on Monday  Diabetes mellitus, type II, uncontrolled with hypoglycemia: Son reports his father had 2 episodes of hypoglycemia over the last couple weeks. Not sure at this point if his insulin pump will be the best option, it has already been discontinued while in ED, will place on insulin sliding scale, and likely will try to put on oral agents of possible, as it would be a safer option in this patient who cannot do his insulin pump or injectable insulin as he lives alone with frequent confusions. Diabetic coordinator consulted.   Delirium : Son reports he has been more forgetful recently, Acute confusion could be in the setting of hypertensive urgency. Patient reports visual hallucinations, trying to get out of bed. Continue Ativan as needed for agitation. Psychiatry consulted for further evaluation. UDS negative   pA.Fib with slow ventricular response; Not on any heart rate control medication given slow ventricular response. Continue  Eliquis.   S/P CABG (coronary artery bypass graft) CAD, CABG in 2003 Continue with aspirin and statin.  Pressure ulcer, POA: Stage 1 on buttock and stage 1 on left heel. Continue  with nurse driven pressure ulcer prevention protocol.   Hypomagnesemia: Replaced.  Continue to monitor  Severe deconditioning: PT and OT recommended SNF.  Acute kidney injury: Baseline serum creatinine normal, up to 1.34 likely prerenal. Serum creatinine is trending up to 1.39> 2.21 >1.83 Continue IV fluid resuscitation, monitor serum creatinine Avoid nephrotoxic medications, obtain renal ultrasound.    DVT prophylaxis: Eliquis Code Status: Full code Family Communication:  Spoke with son [name]. Disposition Plan:   Status is: Inpatient Remains inpatient appropriate because:    Patient admitted for hypertensive urgency and recurrent hypoglycemic episodes, PT and OT recommended SNF. Patient not able to take care of himself. He is scheduled for TEE tomorrow   Consultants:  Cardiology Psychiatry  Procedures: None  Antimicrobials:  Anti-infectives (From admission, onward)    None       Subjective: Patient was seen and examined at bedside.Overnight events noted.   Patient appears very deconditioned, blood pressure has improved.  Denies any chest pain  Objective: Vitals:   11/25/22 0356 11/25/22 0651 11/25/22 0819 11/25/22 1130  BP: 136/72 133/71 129/71 126/69  Pulse: 63  66 63  Resp: 14  17 19   Temp: 97.8 F (36.6 C)  98.2 F (36.8 C) 99 F (37.2 C)  TempSrc: Axillary  Oral Oral  SpO2: 100%  97% 99%  Weight: 74.8 kg     Height:        Intake/Output Summary (Last 24 hours) at 11/25/2022 1310 Last data filed at 11/25/2022 1230 Gross per 24 hour  Intake 1290.4 ml  Output 1150 ml  Net 140.4 ml   Filed Weights   11/23/22 0500 11/24/22 0427 11/25/22 0356  Weight: 73.8 kg 73.6 kg 74.8 kg    Examination:  General exam: Appears comfortable, deconditioned, not in any acute distress. Respiratory system: CTA bilaterally. Respiratory effort normal.  RR 12 Cardiovascular system: S1 & S2 heard, RRR. No JVD, murmurs, rubs, gallops or clicks.  Gastrointestinal  system: Abdomen is non distended, soft and non tender. Normal bowel sounds heard. Central nervous system: Alert and oriented X 2. No focal neurological deficits. Extremities: No edema, No cyanosis, No clubbing  Skin: No rashes, lesions or ulcers Psychiatry: Mood & affect appropriate.     Data Reviewed: I have personally reviewed following labs and imaging studies  CBC: Recent Labs  Lab 11/20/22 1049 11/21/22 0344 11/22/22 0322 11/23/22 0331 11/24/22 0654  WBC 5.3 5.1 6.3 7.1 8.9  NEUTROABS 3.3 3.1  --   --   --   HGB 12.6* 10.8* 12.2* 12.2* 10.7*  HCT 38.9* 32.3* 36.8* 37.6* 33.7*  MCV 91.3 89.5 90.9 91.3 93.1  PLT 158 149* 178 194 175   Basic Metabolic Panel: Recent Labs  Lab 11/20/22 1049 11/21/22 0344 11/22/22 0322 11/23/22 0331 11/24/22 0654 11/25/22 0256  NA 138 137  --  137 138 139  K 4.5 3.6  --  4.2 4.7 4.0  CL 103 102  --  101 102 102  CO2 31 27  --  29 26 27   GLUCOSE 99 217*  --  103* 164* 128*  BUN 14 14  --  36* 52* 48*  CREATININE 0.69 1.06  --  1.39* 2.21* 1.83*  CALCIUM 8.5* 7.9*  --  8.4* 8.3* 8.1*  MG  --  1.5* 2.0 2.1  2.0  --   PHOS  --  4.2  --  4.1 5.0*  --    GFR: Estimated Creatinine Clearance: 40.3 mL/min (A) (by C-G formula based on SCr of 1.83 mg/dL (H)). Liver Function Tests: Recent Labs  Lab 11/20/22 1049  AST 29  ALT 8  ALKPHOS 87  BILITOT 0.6  PROT 5.7*  ALBUMIN 2.9*   No results for input(s): "LIPASE", "AMYLASE" in the last 168 hours. No results for input(s): "AMMONIA" in the last 168 hours. Coagulation Profile: No results for input(s): "INR", "PROTIME" in the last 168 hours. Cardiac Enzymes: No results for input(s): "CKTOTAL", "CKMB", "CKMBINDEX", "TROPONINI" in the last 168 hours. BNP (last 3 results) No results for input(s): "PROBNP" in the last 8760 hours. HbA1C: No results for input(s): "HGBA1C" in the last 72 hours. CBG: Recent Labs  Lab 11/24/22 1959 11/25/22 0029 11/25/22 0354 11/25/22 0824 11/25/22 1128   GLUCAP 162* 124* 104* 131* 225*   Lipid Profile: No results for input(s): "CHOL", "HDL", "LDLCALC", "TRIG", "CHOLHDL", "LDLDIRECT" in the last 72 hours. Thyroid Function Tests: No results for input(s): "TSH", "T4TOTAL", "FREET4", "T3FREE", "THYROIDAB" in the last 72 hours.  Anemia Panel: No results for input(s): "VITAMINB12", "FOLATE", "FERRITIN", "TIBC", "IRON", "RETICCTPCT" in the last 72 hours.  Sepsis Labs: No results for input(s): "PROCALCITON", "LATICACIDVEN" in the last 168 hours.  Recent Results (from the past 240 hour(s))  Culture, blood (Routine X 2) w Reflex to ID Panel     Status: None (Preliminary result)   Collection Time: 11/22/22  2:49 PM   Specimen: BLOOD RIGHT ARM  Result Value Ref Range Status   Specimen Description BLOOD RIGHT ARM  Final   Special Requests   Final    BOTTLES DRAWN AEROBIC AND ANAEROBIC Blood Culture results may not be optimal due to an inadequate volume of blood received in culture bottles   Culture   Final    NO GROWTH 3 DAYS Performed at [facility], [address], [place], [place] [address]    Report Status PENDING  Incomplete  Culture, blood (Routine X 2) w Reflex to ID Panel     Status: None (Preliminary result)   Collection Time: 11/22/22  2:51 PM   Specimen: BLOOD RIGHT ARM  Result Value Ref Range Status   Specimen Description BLOOD RIGHT ARM  Final   Special Requests   Final    BOTTLES DRAWN AEROBIC AND ANAEROBIC Blood Culture results may not be optimal due to an inadequate volume of blood received in culture bottles   Culture   Final    NO GROWTH 3 DAYS Performed at [facility], [address], [place], [place] [address]    Report Status PENDING  Incomplete     Radiology Studies: CT ABDOMEN PELVIS WO CONTRAST  Result Date: 11/24/2022 CLINICAL DATA:  Concern for bowel obstruction, vomiting. EXAM: CT ABDOMEN AND PELVIS WITHOUT CONTRAST TECHNIQUE: Multidetector CT imaging of the abdomen and pelvis was performed  following the standard protocol without IV contrast. RADIATION DOSE REDUCTION: This exam was performed according to the departmental dose-optimization program which includes automated exposure control, adjustment of the mA and/or kV according to patient size and/or use of iterative reconstruction technique. COMPARISON:  CT abdomen pelvis dated 07/18/2021. FINDINGS: Lower chest: Moderate to large bilateral pleural effusions with associated atelectasis. There are moderate ground-glass opacities and tree-in-bud opacities in the visible lower lungs. Hepatobiliary: No focal liver abnormality is seen. No gallstones, gallbladder wall thickening, or biliary dilatation. Pancreas:  Unremarkable. No pancreatic ductal dilatation or surrounding inflammatory changes. Spleen: Normal in size without focal abnormality. Adrenals/Urinary Tract: Adrenal glands are unremarkable. Kidneys are normal, without renal calculi, focal lesion, or hydronephrosis. Bladder is unremarkable. Stomach/Bowel: Stomach is within normal limits. Appendix appears normal. No evidence of bowel wall thickening, distention, or inflammatory changes. Vascular/Lymphatic: Aortic atherosclerosis. The infrarenal abdominal aorta measures 3.0 cm in diameter, unchanged. Aneurysmal dilatation of the right common iliac artery appears unchanged, measuring 2.7 cm in diameter. No enlarged abdominal or pelvic lymph nodes. Reproductive: A penile pump with reservoir in the left hemipelvis is redemonstrated. The prostate is unremarkable. Other: No abdominal wall hernia or abnormality. No abdominopelvic ascites. Musculoskeletal: Degenerative changes are seen in the spine. IMPRESSION: 1. No acute process in the abdomen or pelvis. No evidence of bowel obstruction. 2. Moderate to large bilateral pleural effusions with associated atelectasis. Moderate ground-glass opacities and tree-in-bud opacities in the visible lower lungs may reflect pulmonary edema or infection. 3. Infrarenal  abdominal aortic aneurysm measuring 3.0 cm. Recommend follow-up ultrasound every 3 years. (Ref.: J Vasc Surg. 2018; 67:2-77 and J Am Coll Radiol 2013;10(10):789-794.) Aortic Atherosclerosis (ICD10-I70.0). Electronically Signed   By: Romona Curls M.D.   On: 11/24/2022 15:25   DG CHEST PORT 1 VIEW  Result Date: 11/24/2022 CLINICAL DATA:  [id number] Vomiting, unspecified [id number] EXAM: PORTABLE CHEST 1 VIEW COMPARISON:  November 24, 2022 FINDINGS: The cardiomediastinal silhouette is unchanged in contour.Status post median sternotomy and CABG. Atherosclerotic calcifications. Moderate RIGHT and small LEFT pleural effusion. No pneumothorax. Similar perihilar predominant interstitial opacities with more confluent bibasilar airspace opacities. IMPRESSION: Similar findings of pulmonary edema with moderate RIGHT and small LEFT pleural effusions and favored scattered atelectasis. Electronically Signed   By: Meda Klinefelter M.D.   On: 11/24/2022 12:01   US RENAL  Result Date: 11/24/2022 CLINICAL DATA:  [id number] AKI (acute kidney injury) (HCC) [id number] EXAM: RENAL / URINARY TRACT ULTRASOUND COMPLETE COMPARISON:  January 04, 2022 FINDINGS: Right Kidney: Renal measurements: 13.0 x 5.4 x 6.0 cm = volume: 176 mL. Echogenicity within normal limits. No mass or hydronephrosis visualized. Left Kidney: Renal measurements: 13.5 x 6.3 x 5.3 cm = volume: 236 mL. Echogenicity within normal limits. No mass or hydronephrosis visualized. Portions are obscured by shadowing bowel gas. Bladder: Bladder appears moderately distended and has an estimated volume of 1351 ML. Other: Bilateral pleural effusions. IMPRESSION: 1. No hydronephrosis. 2. Bilateral pleural effusions. 3. Bladder appears moderately distended. Electronically Signed   By: Meda Klinefelter M.D.   On: 11/24/2022 12:00    Scheduled Meds:  amLODipine  10 mg Oral Daily   apixaban  5 mg Oral BID   aspirin EC  81 mg Oral Daily   atorvastatin  40 mg Oral Daily   feeding  supplement  237 mL Oral BID BM   furosemide  40 mg Intravenous BID   hydrALAZINE  100 mg Oral Q8H   insulin aspart  0-9 Units Subcutaneous Q4H   losartan  50 mg Oral Daily   mirtazapine  15 mg Oral QHS   multivitamin with minerals  1 tablet Oral Daily   PARoxetine  20 mg Oral Daily   sodium chloride flush  3 mL Intravenous Q12H   spironolactone  25 mg Oral Daily   thiamine  200 mg Oral Daily   Continuous Infusions:     LOS: 4 days    Time spent: 35 mins    Willeen Niece, MD [facility]   If 7PM-7AM, please contact night-coverage

## 2022-11-25 NOTE — Plan of Care (Signed)
Problem: Education: Goal: Knowledge of General Education information will improve Description: Including pain rating scale, medication(s)/side effects and non-pharmacologic comfort measures Outcome: Progressing   Problem: Health Behavior/Discharge Planning: Goal: Ability to manage health-related needs will improve Outcome: Progressing   Problem: Clinical Measurements: Goal: Ability to maintain clinical measurements within normal limits will improve Outcome: Progressing Goal: Will remain free from infection Outcome: Progressing Goal: Diagnostic test results will improve Outcome: Progressing Goal: Respiratory complications will improve Outcome: Progressing Goal: Cardiovascular complication will be avoided Outcome: Progressing   Problem: Activity: Goal: Risk for activity intolerance will decrease Outcome: Progressing   Problem: Nutrition: Goal: Adequate nutrition will be maintained Outcome: Progressing   Problem: Coping: Goal: Level of anxiety will decrease Outcome: Progressing   Problem: Elimination: Goal: Will not experience complications related to bowel motility Outcome: Progressing Goal: Will not experience complications related to urinary retention Outcome: Progressing   Problem: Safety: Goal: Ability to remain free from injury will improve Outcome: Progressing   Problem: Skin Integrity: Goal: Risk for impaired skin integrity will decrease Outcome: Progressing   Problem: Education: Goal: Ability to describe self-care measures that may prevent or decrease complications (Diabetes Survival Skills Education) will improve Outcome: Progressing Goal: Individualized Educational Video(s) Outcome: Progressing   Problem: Coping: Goal: Ability to adjust to condition or change in health will improve Outcome: Progressing   Problem: Fluid Volume: Goal: Ability to maintain a balanced intake and output will improve Outcome: Progressing   Problem: Health  Behavior/Discharge Planning: Goal: Ability to identify and utilize available resources and services will improve Outcome: Progressing Goal: Ability to manage health-related needs will improve Outcome: Progressing   Problem: Metabolic: Goal: Ability to maintain appropriate glucose levels will improve Outcome: Progressing   Problem: Nutritional: Goal: Maintenance of adequate nutrition will improve Outcome: Progressing Goal: Progress toward achieving an optimal weight will improve Outcome: Progressing   Problem: Skin Integrity: Goal: Risk for impaired skin integrity will decrease Outcome: Progressing   Problem: Tissue Perfusion: Goal: Adequacy of tissue perfusion will improve Outcome: Progressing

## 2022-11-25 NOTE — Progress Notes (Signed)
PROGRESS NOTE    [name] [name]  IWP:[id number] DOB: Feb 23, 1955 DOA: 11/20/2022 PCP: Bary Leriche, PA-C    Brief Narrative:  This 67 yrs old Male with PMH significant for diabetes mellitus II, on insulin pump for last 3 years, CAD with history of CABG in 2003, hypertension, peripheral vascular disease, paroxysmal A-fib on Eliquis, with slow ventricular response, hyperlipidemia who  was sent by his PCP to ED for uncontrolled blood pressure, he has not been able to take care of himself at home, reports He had 2 episodes of hypoglycemia where he was found unconscious on the floor over last 2 weeks due to poor oral intake, as well patient has not been compliant with medications. He was found to have significantly high blood pressure in the ED 233/102. Chest x-ray significant for increased  vascular congestion >pulmonary edema, Patient required multiple medications for blood pressure control including p.o. clonidine, losartan, IV hydralazine, blood pressure remains elevated, his BNP was  elevated at 262. He was admitted for hypertensive urgency as well as recurrent hypoglycemic episodes.  Patient needs placement   Assessment & Plan:   Principal Problem:   Hypertensive emergency Active Problems:   Coronary artery disease   Benign prostatic hyperplasia without lower urinary tract symptoms   PAF (paroxysmal atrial fibrillation) (HCC)   Acute exacerbation of CHF (congestive heart failure) (HCC)   Diabetes mellitus (HCC)   Atrial fibrillation with slow ventricular response (HCC)   Heart valve vegetation  Hypertensive urgency: Patient presented with significantly elevated blood pressure due to non compliance. Continue amlodipine 10 mg daily. Continue hydralazine to 100 mg q8hr Continue hydralazine 10 mg IVP every 6 hours as needed. No beta-blockers given known slow ventricular response and low heart rate. Avoid clonidine due to withdrawals. Continue losartan and spironolactone. BP  much improved.  Acute on chronic diastolic CHF: He presented with volume overload on imaging, elevated BNP, most likely provoked due to above. Continue with IV Lasix 40 mg IV twice daily. Continue Aldactone 25 mg dialy. Echo 1/24 : LVEF 60 to 65%, normal LV function. Echo 11/24: LVEF 55-60%, Normal LVF Monitor daily weight, intake output charting.  Intake/Output Summary (Last 24 hours) at 11/25/2022 1310 Last data filed at 11/25/2022 1230 Gross per 24 hour  Intake 1290.4 ml  Output 1150 ml  Net 140.4 ml     Suspected vegetation: Patient was found to have vegetation on Aortic valve. Infectious disease consulted for further recommendation. Hold on Antibiotics for now as there is no leucocytosis,  No fever. Blood cultures no growth so far Patient is scheduled for TEE on Monday  Diabetes mellitus, type II, uncontrolled with hypoglycemia: Son reports his father had 2 episodes of hypoglycemia over the last couple weeks. Not sure at this point if his insulin pump will be the best option, it has already been discontinued while in ED, will place on insulin sliding scale, and likely will try to put on oral agents of possible, as it would be a safer option in this patient who cannot do his insulin pump or injectable insulin as he lives alone with frequent confusions. Diabetic coordinator consulted.   Delirium : Son reports he has been more forgetful recently, Acute confusion could be in the setting of hypertensive urgency. Patient reports visual hallucinations, trying to get out of bed. Continue Ativan as needed for agitation. Psychiatry consulted for further evaluation. UDS negative   pA.Fib with slow ventricular response; Not on any heart rate control medication given slow ventricular response. Continue  Eliquis.   S/P CABG (coronary artery bypass graft) CAD, CABG in 2003 Continue with aspirin and statin.  Pressure ulcer, POA: Stage 1 on buttock and stage 1 on left heel. Continue  with nurse driven pressure ulcer prevention protocol.   Hypomagnesemia: Replaced.  Continue to monitor  Severe deconditioning: PT and OT recommended SNF.  Acute kidney injury: Baseline serum creatinine normal, up to 1.34 likely prerenal. Serum creatinine is trending up to 1.39> 2.21 >1.83 Continue IV fluid resuscitation, monitor serum creatinine Avoid nephrotoxic medications, obtain renal ultrasound.    DVT prophylaxis: Eliquis Code Status: Full code Family Communication:  Spoke with son [name]. Disposition Plan:   Status is: Inpatient Remains inpatient appropriate because:    Patient admitted for hypertensive urgency and recurrent hypoglycemic episodes, PT and OT recommended SNF. Patient not able to take care of himself. He is scheduled for TEE tomorrow   Consultants:  Cardiology Psychiatry  Procedures: None  Antimicrobials:  Anti-infectives (From admission, onward)    None       Subjective: Patient was seen and examined at bedside.Overnight events noted.   Patient appears very deconditioned, blood pressure has improved.  Denies any chest pain  Objective: Vitals:   11/25/22 0356 11/25/22 0651 11/25/22 0819 11/25/22 1130  BP: 136/72 133/71 129/71 126/69  Pulse: 63  66 63  Resp: 14  17 19   Temp: 97.8 F (36.6 C)  98.2 F (36.8 C) 99 F (37.2 C)  TempSrc: Axillary  Oral Oral  SpO2: 100%  97% 99%  Weight: 74.8 kg     Height:        Intake/Output Summary (Last 24 hours) at 11/25/2022 1310 Last data filed at 11/25/2022 1230 Gross per 24 hour  Intake 1290.4 ml  Output 1150 ml  Net 140.4 ml   Filed Weights   11/23/22 0500 11/24/22 0427 11/25/22 0356  Weight: 73.8 kg 73.6 kg 74.8 kg    Examination:  General exam: Appears comfortable, deconditioned, not in any acute distress. Respiratory system: CTA bilaterally. Respiratory effort normal.  RR 12 Cardiovascular system: S1 & S2 heard, RRR. No JVD, murmurs, rubs, gallops or clicks.  Gastrointestinal  system: Abdomen is non distended, soft and non tender. Normal bowel sounds heard. Central nervous system: Alert and oriented X 2. No focal neurological deficits. Extremities: No edema, No cyanosis, No clubbing  Skin: No rashes, lesions or ulcers Psychiatry: Mood & affect appropriate.     Data Reviewed: I have personally reviewed following labs and imaging studies  CBC: Recent Labs  Lab 11/20/22 1049 11/21/22 0344 11/22/22 0322 11/23/22 0331 11/24/22 0654  WBC 5.3 5.1 6.3 7.1 8.9  NEUTROABS 3.3 3.1  --   --   --   HGB 12.6* 10.8* 12.2* 12.2* 10.7*  HCT 38.9* 32.3* 36.8* 37.6* 33.7*  MCV 91.3 89.5 90.9 91.3 93.1  PLT 158 149* 178 194 175   Basic Metabolic Panel: Recent Labs  Lab 11/20/22 1049 11/21/22 0344 11/22/22 0322 11/23/22 0331 11/24/22 0654 11/25/22 0256  NA 138 137  --  137 138 139  K 4.5 3.6  --  4.2 4.7 4.0  CL 103 102  --  101 102 102  CO2 31 27  --  29 26 27   GLUCOSE 99 217*  --  103* 164* 128*  BUN 14 14  --  36* 52* 48*  CREATININE 0.69 1.06  --  1.39* 2.21* 1.83*  CALCIUM 8.5* 7.9*  --  8.4* 8.3* 8.1*  MG  --  1.5* 2.0 2.1  2.0  --   PHOS  --  4.2  --  4.1 5.0*  --    GFR: Estimated Creatinine Clearance: 40.3 mL/min (A) (by C-G formula based on SCr of 1.83 mg/dL (H)). Liver Function Tests: Recent Labs  Lab 11/20/22 1049  AST 29  ALT 8  ALKPHOS 87  BILITOT 0.6  PROT 5.7*  ALBUMIN 2.9*   No results for input(s): "LIPASE", "AMYLASE" in the last 168 hours. No results for input(s): "AMMONIA" in the last 168 hours. Coagulation Profile: No results for input(s): "INR", "PROTIME" in the last 168 hours. Cardiac Enzymes: No results for input(s): "CKTOTAL", "CKMB", "CKMBINDEX", "TROPONINI" in the last 168 hours. BNP (last 3 results) No results for input(s): "PROBNP" in the last 8760 hours. HbA1C: No results for input(s): "HGBA1C" in the last 72 hours. CBG: Recent Labs  Lab 11/24/22 1959 11/25/22 0029 11/25/22 0354 11/25/22 0824 11/25/22 1128   GLUCAP 162* 124* 104* 131* 225*   Lipid Profile: No results for input(s): "CHOL", "HDL", "LDLCALC", "TRIG", "CHOLHDL", "LDLDIRECT" in the last 72 hours. Thyroid Function Tests: No results for input(s): "TSH", "T4TOTAL", "FREET4", "T3FREE", "THYROIDAB" in the last 72 hours.  Anemia Panel: No results for input(s): "VITAMINB12", "FOLATE", "FERRITIN", "TIBC", "IRON", "RETICCTPCT" in the last 72 hours.  Sepsis Labs: No results for input(s): "PROCALCITON", "LATICACIDVEN" in the last 168 hours.  Recent Results (from the past 240 hour(s))  Culture, blood (Routine X 2) w Reflex to ID Panel     Status: None (Preliminary result)   Collection Time: 11/22/22  2:49 PM   Specimen: BLOOD RIGHT ARM  Result Value Ref Range Status   Specimen Description BLOOD RIGHT ARM  Final   Special Requests   Final    BOTTLES DRAWN AEROBIC AND ANAEROBIC Blood Culture results may not be optimal due to an inadequate volume of blood received in culture bottles   Culture   Final    NO GROWTH 3 DAYS Performed at [facility], [address], [place], [place] [address]    Report Status PENDING  Incomplete  Culture, blood (Routine X 2) w Reflex to ID Panel     Status: None (Preliminary result)   Collection Time: 11/22/22  2:51 PM   Specimen: BLOOD RIGHT ARM  Result Value Ref Range Status   Specimen Description BLOOD RIGHT ARM  Final   Special Requests   Final    BOTTLES DRAWN AEROBIC AND ANAEROBIC Blood Culture results may not be optimal due to an inadequate volume of blood received in culture bottles   Culture   Final    NO GROWTH 3 DAYS Performed at [facility], [address], [place], [place] [address]    Report Status PENDING  Incomplete     Radiology Studies: CT ABDOMEN PELVIS WO CONTRAST  Result Date: 11/24/2022 CLINICAL DATA:  Concern for bowel obstruction, vomiting. EXAM: CT ABDOMEN AND PELVIS WITHOUT CONTRAST TECHNIQUE: Multidetector CT imaging of the abdomen and pelvis was performed  following the standard protocol without IV contrast. RADIATION DOSE REDUCTION: This exam was performed according to the departmental dose-optimization program which includes automated exposure control, adjustment of the mA and/or kV according to patient size and/or use of iterative reconstruction technique. COMPARISON:  CT abdomen pelvis dated 07/18/2021. FINDINGS: Lower chest: Moderate to large bilateral pleural effusions with associated atelectasis. There are moderate ground-glass opacities and tree-in-bud opacities in the visible lower lungs. Hepatobiliary: No focal liver abnormality is seen. No gallstones, gallbladder wall thickening, or biliary dilatation. Pancreas:  Unremarkable. No pancreatic ductal dilatation or surrounding inflammatory changes. Spleen: Normal in size without focal abnormality. Adrenals/Urinary Tract: Adrenal glands are unremarkable. Kidneys are normal, without renal calculi, focal lesion, or hydronephrosis. Bladder is unremarkable. Stomach/Bowel: Stomach is within normal limits. Appendix appears normal. No evidence of bowel wall thickening, distention, or inflammatory changes. Vascular/Lymphatic: Aortic atherosclerosis. The infrarenal abdominal aorta measures 3.0 cm in diameter, unchanged. Aneurysmal dilatation of the right common iliac artery appears unchanged, measuring 2.7 cm in diameter. No enlarged abdominal or pelvic lymph nodes. Reproductive: A penile pump with reservoir in the left hemipelvis is redemonstrated. The prostate is unremarkable. Other: No abdominal wall hernia or abnormality. No abdominopelvic ascites. Musculoskeletal: Degenerative changes are seen in the spine. IMPRESSION: 1. No acute process in the abdomen or pelvis. No evidence of bowel obstruction. 2. Moderate to large bilateral pleural effusions with associated atelectasis. Moderate ground-glass opacities and tree-in-bud opacities in the visible lower lungs may reflect pulmonary edema or infection. 3. Infrarenal  abdominal aortic aneurysm measuring 3.0 cm. Recommend follow-up ultrasound every 3 years. (Ref.: J Vasc Surg. 2018; 67:2-77 and J Am Coll Radiol 2013;10(10):789-794.) Aortic Atherosclerosis (ICD10-I70.0). Electronically Signed   By: Romona Curls M.D.   On: 11/24/2022 15:25   DG CHEST PORT 1 VIEW  Result Date: 11/24/2022 CLINICAL DATA:  [id number] Vomiting, unspecified [id number] EXAM: PORTABLE CHEST 1 VIEW COMPARISON:  November 24, 2022 FINDINGS: The cardiomediastinal silhouette is unchanged in contour.Status post median sternotomy and CABG. Atherosclerotic calcifications. Moderate RIGHT and small LEFT pleural effusion. No pneumothorax. Similar perihilar predominant interstitial opacities with more confluent bibasilar airspace opacities. IMPRESSION: Similar findings of pulmonary edema with moderate RIGHT and small LEFT pleural effusions and favored scattered atelectasis. Electronically Signed   By: Meda Klinefelter M.D.   On: 11/24/2022 12:01   US RENAL  Result Date: 11/24/2022 CLINICAL DATA:  [id number] AKI (acute kidney injury) (HCC) [id number] EXAM: RENAL / URINARY TRACT ULTRASOUND COMPLETE COMPARISON:  January 04, 2022 FINDINGS: Right Kidney: Renal measurements: 13.0 x 5.4 x 6.0 cm = volume: 176 mL. Echogenicity within normal limits. No mass or hydronephrosis visualized. Left Kidney: Renal measurements: 13.5 x 6.3 x 5.3 cm = volume: 236 mL. Echogenicity within normal limits. No mass or hydronephrosis visualized. Portions are obscured by shadowing bowel gas. Bladder: Bladder appears moderately distended and has an estimated volume of 1351 ML. Other: Bilateral pleural effusions. IMPRESSION: 1. No hydronephrosis. 2. Bilateral pleural effusions. 3. Bladder appears moderately distended. Electronically Signed   By: Meda Klinefelter M.D.   On: 11/24/2022 12:00    Scheduled Meds:  amLODipine  10 mg Oral Daily   apixaban  5 mg Oral BID   aspirin EC  81 mg Oral Daily   atorvastatin  40 mg Oral Daily   feeding  supplement  237 mL Oral BID BM   furosemide  40 mg Intravenous BID   hydrALAZINE  100 mg Oral Q8H   insulin aspart  0-9 Units Subcutaneous Q4H   losartan  50 mg Oral Daily   mirtazapine  15 mg Oral QHS   multivitamin with minerals  1 tablet Oral Daily   PARoxetine  20 mg Oral Daily   sodium chloride flush  3 mL Intravenous Q12H   spironolactone  25 mg Oral Daily   thiamine  200 mg Oral Daily   Continuous Infusions:     LOS: 4 days    Time spent: 35 mins    Willeen Niece, MD [facility]   If 7PM-7AM, please contact night-coverage

## 2022-11-25 NOTE — Plan of Care (Signed)
Problem: Education: Goal: Knowledge of General Education information will improve Description Including pain rating scale, medication(s)/side effects and non-pharmacologic comfort measures Outcome: Progressing   Problem: Clinical Measurements: Goal: Will remain free from infection Outcome: Progressing   Problem: Clinical Measurements: Goal: Cardiovascular complication will be avoided Outcome: Progressing

## 2022-11-25 NOTE — Consult Note (Signed)
[facility] Face-to-Face Psychiatry Consult   Reason for Consult: Agitation and aggression and hallucinations. Referring Physician:  Dr. Willeen Niece Patient Identification: [name] [name] MRN:  [id number] Principal Diagnosis: Hypertensive emergency Diagnosis:  Principal Problem:   Hypertensive emergency Active Problems:   Coronary artery disease   Benign prostatic hyperplasia without lower urinary tract symptoms   PAF (paroxysmal atrial fibrillation) (HCC)   Acute exacerbation of CHF (congestive heart failure) (HCC)   Diabetes mellitus (HCC)   Atrial fibrillation with slow ventricular response (HCC)   Heart valve vegetation   Total Time spent with patient: 1 hour  Subjective:   [name] [name] is a 67 y.o. male patient admitted with hypertensive emergency, and suspected aortic valve endocarditis.   HPI: Chart was reviewed and patient was interviewed.  Patient reports he was admitted for high blood pressure.  Patient was able to state his age is 67 years old and that he is located [facility].  Patient reports that he is doing "pretty good".  Patient reports that he overdid his Eliquis.  Patient was very confused throughout interview and looking around the room during interview.  Patient did not answer questions appropriately, and did not seem to understand questions as well.  Patient denied suicidal ideation, homicidal ideation, and auditory or visual hallucinations.  Patient randomly stated "I cannot drive".  Patient states he lives alone in [place] and that he is not married.  Patient reports he has 5 kids and that they always needs something from him.  It was difficult to obtain a thorough history from patient due to his confusion.  Patient denied any drug or alcohol use.  Upon interview today) 11/25/2022) patient reports that he is lethargic.  He states the date is the 25th Thursday.  He states he is in [facility].  Patient reports sleeping okay.   Patient patient reports his appetite is good.  Patient reports having weird dreams.  Patient denies suicidal ideation or homicidal ideation.  Past Psychiatric History: Patient denied any significant history, per chart review notes a history of anxiety and depression.  Chart reviewed notes that patient is currently taking Paxil and Remeron.  Patient denied any suicide attempts or psychiatric hospitalizations.  Risk to Self:   Risk to Others:   Prior Inpatient Therapy:   Prior Outpatient Therapy:    Past Medical History:  Past Medical History:  Diagnosis Date   Anxiety    Arthritis    Cataract    CHF (congestive heart failure) (HCC)    Constipation    pt states goes EOD- hard stools    Coronary artery disease    Depression    Diabetes mellitus without complication (HCC)    Diabetic retinopathy (HCC)    DKA (diabetic ketoacidoses)    Hyperlipidemia    Hypertension    off meds   Hypertensive retinopathy    Non-intractable vomiting without nausea 06/10/2018   Postherpetic neuralgia 08/11/2021   Speculative diagnosis   Psoriasis    Tubular adenoma of colon 03/2019    Past Surgical History:  Procedure Laterality Date   ABDOMINAL AORTOGRAM W/LOWER EXTREMITY N/A 08/30/2020   Procedure: ABDOMINAL AORTOGRAM W/LOWER EXTREMITY;  Surgeon: Nada Libman, MD;  Location: [facility];  Service: Cardiovascular;  Laterality: N/A;   ABDOMINAL AORTOGRAM W/LOWER EXTREMITY N/A 11/15/2020   Procedure: ABDOMINAL AORTOGRAM W/LOWER EXTREMITY;  Surgeon: Nada Libman, MD;  Location: [facility];  Service: Cardiovascular;  Laterality: N/A;   ANKLE SURGERY  right   CARDIAC CATHETERIZATION  09/25/2001   COLONOSCOPY     ~10 yr ago- normal  per pt    CORONARY ARTERY BYPASS GRAFT  09/30/2001   CABG x 4   IR THORACENTESIS ASP PLEURAL SPACE W/IMG GUIDE  05/19/2020   LAPAROSCOPIC INGUINAL HERNIA REPAIR Bilateral 02/09/2010   MASS EXCISION Left 03/12/2013   Procedure: LEFT INDEX EXCISION  MASS AND DEBRIDMENT DISTAL INTERPHALANGEAL JOINT;  Surgeon: Tami Ribas, MD;  Location: [facility];  Service: Orthopedics;  Laterality: Left;   PERIPHERAL VASCULAR BALLOON ANGIOPLASTY Left 08/30/2020   Procedure: PERIPHERAL VASCULAR BALLOON ANGIOPLASTY;  Surgeon: Nada Libman, MD;  Location: [facility];  Service: Cardiovascular;  Laterality: Left;  Peroneal   Family History:  Family History  Problem Relation Age of Onset   Lymphoma Father    Cancer Paternal Grandmother        Colon Cancer   Colon cancer Paternal Grandmother    Colon polyps Neg Hx    Esophageal cancer Neg Hx    Rectal cancer Neg Hx    Stomach cancer Neg Hx    Diabetes Mellitus I Neg Hx    Pancreatic cancer Neg Hx    Family Psychiatric  History: Unknown Social History:  Social History   Substance and Sexual Activity  Alcohol Use Yes   Comment: about once a year     Social History   Substance and Sexual Activity  Drug Use No    Social History   Socioeconomic History   Marital status: Divorced    Spouse name: Not on file   Number of children: 4   Years of education: 12   Highest education level: 12th grade  Occupational History   Occupation: Retired  Tobacco Use   Smoking status: Former    Types: Cigars    Quit date: 2022    Years since quitting: 2.8   Smokeless tobacco: Never  Vaping Use   Vaping status: Never Used  Substance and Sexual Activity   Alcohol use: Yes    Comment: about once a year   Drug use: No   Sexual activity: Yes  Other Topics Concern   Not on file  Social History Narrative   Lives with his adult son [name].   Has 4 children, 2 sons- both live in [place], 2 daughters one lives in [place], [place] and youngest daughter attends [employer].    Fiance lives in  [place], [place]   Social Determinants of Health   Financial Resource Strain: Low Risk  (11/21/2022)   Overall Financial Resource Strain (CARDIA)    Difficulty of Paying  Living Expenses: Not very hard  Food Insecurity: Food Insecurity Present (11/21/2022)   Hunger Vital Sign    Worried About Running Out of Food in the Last Year: Sometimes true    Ran Out of Food in the Last Year: Sometimes true  Transportation Needs: Patient Unable To Answer (11/22/2022)   PRAPARE - Transportation    Lack of Transportation (Medical): Patient unable to answer    Lack of Transportation (Non-Medical): Patient unable to answer  Recent Concern: Transportation Needs - Unmet Transportation Needs (11/21/2022)   PRAPARE - Administrator, Civil Service (Medical): Yes    Lack of Transportation (Non-Medical): Yes  Physical Activity: Inactive (11/21/2022)   Exercise Vital Sign    Days of Exercise per Week: 0 days    Minutes of Exercise per Session: 0 min  Stress: Stress Concern Present (11/21/2022)  [employer] of Occupational Health - Occupational Stress Questionnaire    Feeling of Stress : Very much  Social Connections: Socially Isolated (11/21/2022)   Social Connection and Isolation Panel [NHANES]    Frequency of Communication with Friends and Family: Once a week    Frequency of Social Gatherings with Friends and Family: Once a week    Attends Religious Services: Never    Database administrator or Organizations: No    Attends Banker Meetings: Never    Marital Status: Divorced   Additional Social History:    Allergies:   Allergies  Allergen Reactions   Wool Alcohol [Lanolin] Hives and Itching    Wool fabric    Labs:  Results for orders placed or performed during the hospital encounter of 11/20/22 (from the past 48 hour(s))  Rapid urine drug screen (hospital performed)     Status: None   Collection Time: 11/23/22  1:36 PM  Result Value Ref Range   Opiates NONE DETECTED NONE DETECTED   Cocaine NONE DETECTED NONE DETECTED   Benzodiazepines NONE DETECTED NONE DETECTED   Amphetamines NONE DETECTED NONE DETECTED   Tetrahydrocannabinol  NONE DETECTED NONE DETECTED   Barbiturates NONE DETECTED NONE DETECTED    Comment: (NOTE) DRUG SCREEN FOR MEDICAL PURPOSES ONLY.  IF CONFIRMATION IS NEEDED FOR ANY PURPOSE, NOTIFY LAB WITHIN 5 DAYS.  LOWEST DETECTABLE LIMITS FOR URINE DRUG SCREEN Drug Class                     Cutoff (ng/mL) Amphetamine and metabolites    1000 Barbiturate and metabolites    200 Benzodiazepine                 200 Opiates and metabolites        300 Cocaine and metabolites        300 THC                            50 Performed at [facility], [address], [place], [place] [address]   Glucose, capillary     Status: Abnormal   Collection Time: 11/23/22  4:26 PM  Result Value Ref Range   Glucose-Capillary 253 (H) 70 - 99 mg/dL    Comment: Glucose reference range applies only to samples taken after fasting for at least 8 hours.  Glucose, capillary     Status: Abnormal   Collection Time: 11/23/22  8:53 PM  Result Value Ref Range   Glucose-Capillary 123 (H) 70 - 99 mg/dL    Comment: Glucose reference range applies only to samples taken after fasting for at least 8 hours.  Troponin I (High Sensitivity)     Status: Abnormal   Collection Time: 11/23/22 11:56 PM  Result Value Ref Range   Troponin I (High Sensitivity) 39 (H) <18 ng/L    Comment: (NOTE) Elevated high sensitivity troponin I (hsTnI) values and significant  changes across serial measurements may suggest ACS but many other  chronic and acute conditions are known to elevate hsTnI results.  Refer to the "Links" section for chest pain algorithms and additional  guidance. Performed at [facility], [address], [place], [place] [address]   Glucose, capillary     Status: Abnormal   Collection Time: 11/23/22 11:57 PM  Result Value Ref Range   Glucose-Capillary 121 (H) 70 - 99 mg/dL    Comment: Glucose reference range applies only to  samples taken after fasting for at least 8 hours.  Glucose, capillary     Status: Abnormal    Collection Time: 11/24/22  6:32 AM  Result Value Ref Range   Glucose-Capillary 62 (L) 70 - 99 mg/dL    Comment: Glucose reference range applies only to samples taken after fasting for at least 8 hours.  CBC     Status: Abnormal   Collection Time: 11/24/22  6:54 AM  Result Value Ref Range   WBC 8.9 4.0 - 10.5 K/uL   RBC 3.62 (L) 4.22 - 5.81 MIL/uL   Hemoglobin 10.7 (L) 13.0 - 17.0 g/dL   HCT 96.2 (L) 95.2 - 84.1 %   MCV 93.1 80.0 - 100.0 fL   MCH 29.6 26.0 - 34.0 pg   MCHC 31.8 30.0 - 36.0 g/dL   RDW 32.4 40.1 - 02.7 %   Platelets 175 150 - 400 K/uL   nRBC 0.0 0.0 - 0.2 %    Comment: Performed at [facility], [address] [place], [place] [address]  Magnesium     Status: None   Collection Time: 11/24/22  6:54 AM  Result Value Ref Range   Magnesium 2.0 1.7 - 2.4 mg/dL    Comment: Performed at [facility], [address] [place], [place] [address]  Basic metabolic panel     Status: Abnormal   Collection Time: 11/24/22  6:54 AM  Result Value Ref Range   Sodium 138 135 - 145 mmol/L   Potassium 4.7 3.5 - 5.1 mmol/L   Chloride 102 98 - 111 mmol/L   CO2 26 22 - 32 mmol/L   Glucose, Bld 164 (H) 70 - 99 mg/dL    Comment: Glucose reference range applies only to samples taken after fasting for at least 8 hours.   BUN 52 (H) 8 - 23 mg/dL   Creatinine, Ser 7.42 (H) 0.61 - 1.24 mg/dL   Calcium 8.3 (L) 8.9 - 10.3 mg/dL   GFR, Estimated 31 (L) >60 mL/min    Comment: (NOTE) Calculated using the CKD-EPI Creatinine Equation (2021)    Anion gap 10 5 - 15    Comment: Performed at [facility], [address], [place], [place] [address]  Phosphorus     Status: Abnormal   Collection Time: 11/24/22  6:54 AM  Result Value Ref Range   Phosphorus 5.0 (H) 2.5 - 4.6 mg/dL    Comment: Performed at [facility], [address], [place], [place] [address]  Troponin I (High Sensitivity)     Status: Abnormal   Collection Time: 11/24/22  6:54 AM  Result Value Ref Range    Troponin I (High Sensitivity) 60 (H) <18 ng/L    Comment: DELTA CHECK NOTED (NOTE) Elevated high sensitivity troponin I (hsTnI) values and significant  changes across serial measurements may suggest ACS but many other  chronic and acute conditions are known to elevate hsTnI results.  Refer to the "Links" section for chest pain algorithms and additional  guidance. Performed at [facility], [address], [place], [place] [address]   Glucose, capillary     Status: Abnormal   Collection Time: 11/24/22  7:04 AM  Result Value Ref Range   Glucose-Capillary 133 (H) 70 - 99 mg/dL    Comment: Glucose reference range applies only to samples taken after fasting for at least 8 hours.  Glucose, capillary     Status: Abnormal   Collection Time: 11/24/22  7:50  AM  Result Value Ref Range   Glucose-Capillary 131 (H) 70 - 99 mg/dL    Comment: Glucose reference range applies only to samples taken after fasting for at least 8 hours.  Glucose, capillary     Status: Abnormal   Collection Time: 11/24/22 10:52 AM  Result Value Ref Range   Glucose-Capillary 121 (H) 70 - 99 mg/dL    Comment: Glucose reference range applies only to samples taken after fasting for at least 8 hours.  Glucose, capillary     Status: Abnormal   Collection Time: 11/24/22  3:19 PM  Result Value Ref Range   Glucose-Capillary 122 (H) 70 - 99 mg/dL    Comment: Glucose reference range applies only to samples taken after fasting for at least 8 hours.  Glucose, capillary     Status: Abnormal   Collection Time: 11/24/22  7:59 PM  Result Value Ref Range   Glucose-Capillary 162 (H) 70 - 99 mg/dL    Comment: Glucose reference range applies only to samples taken after fasting for at least 8 hours.  Glucose, capillary     Status: Abnormal   Collection Time: 11/25/22 12:29 AM  Result Value Ref Range   Glucose-Capillary 124 (H) 70 - 99 mg/dL    Comment: Glucose reference range applies only to samples taken after fasting for at least  8 hours.  Basic metabolic panel     Status: Abnormal   Collection Time: 11/25/22  2:56 AM  Result Value Ref Range   Sodium 139 135 - 145 mmol/L   Potassium 4.0 3.5 - 5.1 mmol/L   Chloride 102 98 - 111 mmol/L   CO2 27 22 - 32 mmol/L   Glucose, Bld 128 (H) 70 - 99 mg/dL    Comment: Glucose reference range applies only to samples taken after fasting for at least 8 hours.   BUN 48 (H) 8 - 23 mg/dL   Creatinine, Ser 1.61 (H) 0.61 - 1.24 mg/dL   Calcium 8.1 (L) 8.9 - 10.3 mg/dL   GFR, Estimated 39 (L) >60 mL/min    Comment: (NOTE) Calculated using the CKD-EPI Creatinine Equation (2021)    Anion gap 10 5 - 15    Comment: Performed at [facility], [address] [place], [place] [address]  Glucose, capillary     Status: Abnormal   Collection Time: 11/25/22  3:54 AM  Result Value Ref Range   Glucose-Capillary 104 (H) 70 - 99 mg/dL    Comment: Glucose reference range applies only to samples taken after fasting for at least 8 hours.  Glucose, capillary     Status: Abnormal   Collection Time: 11/25/22  8:24 AM  Result Value Ref Range   Glucose-Capillary 131 (H) 70 - 99 mg/dL    Comment: Glucose reference range applies only to samples taken after fasting for at least 8 hours.  Glucose, capillary     Status: Abnormal   Collection Time: 11/25/22 11:28 AM  Result Value Ref Range   Glucose-Capillary 225 (H) 70 - 99 mg/dL    Comment: Glucose reference range applies only to samples taken after fasting for at least 8 hours.    Current Facility-Administered Medications  Medication Dose Route Frequency Provider Last Rate Last Admin   acetaminophen (TYLENOL) tablet 650 mg  650 mg Oral Q4H PRN Elgergawy, Leana Roe, MD       amLODipine (NORVASC) tablet 10 mg  10 mg Oral Daily Elgergawy, Leana Roe, MD   10 mg at 11/25/22 [id number]  apixaban (ELIQUIS) tablet 5 mg  5 mg Oral BID Elgergawy, Leana Roe, MD   5 mg at 11/25/22 1610   aspirin EC tablet 81 mg  81 mg Oral Daily Elgergawy, Leana Roe, MD   81 mg  at 11/25/22 0859   atorvastatin (LIPITOR) tablet 40 mg  40 mg Oral Daily Elgergawy, Leana Roe, MD   40 mg at 11/25/22 0859   feeding supplement (ENSURE ENLIVE / ENSURE PLUS) liquid 237 mL  237 mL Oral BID BM Khatri, Pardeep, MD   237 mL at 11/23/22 1331   furosemide (LASIX) injection 40 mg  40 mg Intravenous BID Elgergawy, Leana Roe, MD   40 mg at 11/25/22 0858   hydrALAZINE (APRESOLINE) injection 10 mg  10 mg Intravenous Q4H PRN Elgergawy, Leana Roe, MD       hydrALAZINE (APRESOLINE) tablet 100 mg  100 mg Oral Q8H Khatri, Pardeep, MD   100 mg at 11/25/22 0651   insulin aspart (novoLOG) injection 0-9 Units  0-9 Units Subcutaneous Q4H John Giovanni, MD   3 Units at 11/25/22 1205   LORazepam (ATIVAN) injection 1 mg  1 mg Intravenous Q6H PRN Willeen Niece, MD   1 mg at 11/22/22 1618   losartan (COZAAR) tablet 50 mg  50 mg Oral Daily Elgergawy, Leana Roe, MD   50 mg at 11/25/22 0859   mirtazapine (REMERON) tablet 15 mg  15 mg Oral QHS Elgergawy, Leana Roe, MD   15 mg at 11/24/22 2116   multivitamin with minerals tablet 1 tablet  1 tablet Oral Daily Willeen Niece, MD   1 tablet at 11/25/22 0859   ondansetron (ZOFRAN) injection 4 mg  4 mg Intravenous Q6H PRN Elgergawy, Leana Roe, MD   4 mg at 11/23/22 1843   PARoxetine (PAXIL) tablet 20 mg  20 mg Oral Daily Elgergawy, Leana Roe, MD   20 mg at 11/25/22 0858   sodium chloride flush (NS) 0.9 % injection 3 mL  3 mL Intravenous Q12H Elgergawy, Leana Roe, MD   3 mL at 11/24/22 0751   sodium chloride flush (NS) 0.9 % injection 3 mL  3 mL Intravenous PRN Elgergawy, Leana Roe, MD       spironolactone (ALDACTONE) tablet 25 mg  25 mg Oral Daily Elgergawy, Leana Roe, MD   25 mg at 11/25/22 9604   thiamine (VITAMIN B1) tablet 200 mg  200 mg Oral Daily Elgergawy, Leana Roe, MD   200 mg at 11/25/22 5409    Musculoskeletal: Strength & Muscle Tone:  Patient is currently in bed Gait & Station:  Patient is currently in bed Patient leans:  Patient is currently in  bed            Psychiatric Specialty Exam:  Presentation  General Appearance:  Disheveled  Eye Contact: Minimal  Speech: Slow; Blocked  Speech Volume: Decreased  Handedness: Right   Mood and Affect  Mood: Irritable  Affect: Constricted   Thought Process  Thought Processes: Irrevelant; Disorganized  Descriptions of Associations:Loose  Orientation:Partial  Thought Content:Illogical; Scattered  History of Schizophrenia/Schizoaffective disorder:No data recorded Duration of Psychotic Symptoms:No data recorded Hallucinations:Hallucinations: None  Ideas of Reference:None  Suicidal Thoughts:Suicidal Thoughts: No  Homicidal Thoughts:Homicidal Thoughts: No   Sensorium  Memory: Immediate Poor; Recent Poor; Remote Poor  Judgment: Poor  Insight: Poor   Executive Functions  Concentration: Poor  Attention Span: Poor  Recall: Poor  Fund of Knowledge: Poor  Language: Poor   Psychomotor Activity  Psychomotor Activity: Psychomotor Activity: Restlessness  Assets  Assets:No data recorded  Sleep  Sleep: Sleep: Poor   Physical Exam: Physical Exam Vitals and nursing note reviewed.    Review of Systems  All other systems reviewed and are negative.  Blood pressure 126/69, pulse 63, temperature 99 F (37.2 C), temperature source Oral, resp. rate 19, height 6\' 2"  (1.88 m), weight 74.8 kg, SpO2 99%. Body mass index is 21.17 kg/m.  Treatment Plan Summary: Daily contact with patient to assess and evaluate symptoms and progress in treatment and Plan see below  Patient is hospitalized for suspected aortic valve endocarditis, with an EKG on 11/19 showing a QTc of 534. Given the risks and benefits, benzodiazepine therapy is currently recommended. If agitation and aggression persist, IV Depakene may be considered. A CT scan on admission showed no acute findings. Considering his medical history (DM type 1, proliferative neuropathy, small  vessel disease, and cardiac issues), mild cognitive impairment/delirium and/ or multifactorial encephalopathy are possible diagnoses at this time.    Substance use screen was negative.    -Repeat EKG on 11/24/2022 showed QTC 428, which has improved - Recommend Ativan 1mg  po TID prn for anxiety. May consider Depakene if unresponsive to ativan.  -Continue work up for encephalopathy -Initiate delirium precautions  -Patient may need a low dose of Haldol or Risperdal if patient's agitation or hallucinations worsen, since his QTc has decreased to 428. -Continue patient's home psych medications of Paxil and Remeron for now.   Disposition: Patient does not meet criteria for psychiatric inpatient admission. Discussed crisis plan, support from social network, calling 911, coming to the Emergency Department, and calling Suicide Hotline.  Ancil Linsey, MD 11/25/2022 1:28 PM

## 2022-11-26 ENCOUNTER — Inpatient Hospital Stay (HOSPITAL_COMMUNITY): Payer: PPO | Admitting: Anesthesiology

## 2022-11-26 ENCOUNTER — Encounter (HOSPITAL_COMMUNITY): Payer: Self-pay | Admitting: Internal Medicine

## 2022-11-26 ENCOUNTER — Inpatient Hospital Stay (HOSPITAL_COMMUNITY): Payer: PPO

## 2022-11-26 ENCOUNTER — Encounter (HOSPITAL_COMMUNITY)
Admission: EM | Disposition: A | Discharge status: Skilled Nursing Facility | Payer: Self-pay | Source: Home / Self Care | Attending: Family Medicine

## 2022-11-26 DIAGNOSIS — I161 Hypertensive emergency: Secondary | ICD-10-CM | POA: Diagnosis not present

## 2022-11-26 DIAGNOSIS — I38 Endocarditis, valve unspecified: Secondary | ICD-10-CM | POA: Diagnosis not present

## 2022-11-26 DIAGNOSIS — R7881 Bacteremia: Secondary | ICD-10-CM

## 2022-11-26 HISTORY — PX: TRANSESOPHAGEAL ECHOCARDIOGRAM (CATH LAB): EP1270

## 2022-11-26 LAB — BASIC METABOLIC PANEL
Anion gap: 11 (ref 5–15)
BUN: 59 mg/dL — ABNORMAL HIGH (ref 8–23)
CO2: 28 mmol/L (ref 22–32)
Calcium: 8.5 mg/dL — ABNORMAL LOW (ref 8.9–10.3)
Chloride: 96 mmol/L — ABNORMAL LOW (ref 98–111)
Creatinine, Ser: 2.07 mg/dL — ABNORMAL HIGH (ref 0.61–1.24)
GFR, Estimated: 34 mL/min — ABNORMAL LOW (ref >60)
Glucose, Bld: 204 mg/dL — ABNORMAL HIGH (ref 70–99)
Potassium: 4.5 mmol/L (ref 3.5–5.1)
Sodium: 135 mmol/L (ref 135–145)

## 2022-11-26 LAB — PHOSPHORUS: Phosphorus: 3.4 mg/dL (ref 2.5–4.6)

## 2022-11-26 LAB — GLUCOSE, CAPILLARY
Glucose-Capillary: 149 mg/dL — ABNORMAL HIGH (ref 70–99)
Glucose-Capillary: 199 mg/dL — ABNORMAL HIGH (ref 70–99)
Glucose-Capillary: 199 mg/dL — ABNORMAL HIGH (ref 70–99)
Glucose-Capillary: 207 mg/dL — ABNORMAL HIGH (ref 70–99)
Glucose-Capillary: 207 mg/dL — ABNORMAL HIGH (ref 70–99)
Glucose-Capillary: 233 mg/dL — ABNORMAL HIGH (ref 70–99)

## 2022-11-26 LAB — CBC
HCT: 36.7 % — ABNORMAL LOW (ref 39.0–52.0)
Hemoglobin: 11.4 g/dL — ABNORMAL LOW (ref 13.0–17.0)
MCH: 29.3 pg (ref 26.0–34.0)
MCHC: 31.1 g/dL (ref 30.0–36.0)
MCV: 94.3 fL (ref 80.0–100.0)
Platelets: 248 10*3/uL (ref 150–400)
RBC: 3.89 MIL/uL — ABNORMAL LOW (ref 4.22–5.81)
RDW: 14.7 % (ref 11.5–15.5)
WBC: 6.9 10*3/uL (ref 4.0–10.5)
nRBC: 0 % (ref 0.0–0.2)

## 2022-11-26 LAB — MAGNESIUM: Magnesium: 2.2 mg/dL (ref 1.7–2.4)

## 2022-11-26 LAB — ECHO TEE

## 2022-11-26 SURGERY — TRANSESOPHAGEAL ECHOCARDIOGRAM (TEE) (CATHLAB)
Anesthesia: Monitor Anesthesia Care

## 2022-11-26 MED ORDER — HYDROXYZINE HCL 25 MG PO TABS
25.0000 mg | ORAL_TABLET | Freq: Three times a day (TID) | ORAL | Status: DC | PRN
  Administered 2022-11-28: 25 mg via ORAL
  Filled 2022-11-26: qty 1

## 2022-11-26 MED ORDER — EPHEDRINE SULFATE-NACL 50-0.9 MG/10ML-% IV SOSY
PREFILLED_SYRINGE | INTRAVENOUS | Status: DC | PRN
  Administered 2022-11-26: 15 mg via INTRAVENOUS
  Administered 2022-11-26: 10 mg via INTRAVENOUS

## 2022-11-26 MED ORDER — FUROSEMIDE 40 MG PO TABS: 40.0000 mg | ORAL_TABLET | Freq: Two times a day (BID) | ORAL | Status: DC

## 2022-11-26 MED ORDER — PROPOFOL 10 MG/ML IV BOLUS
INTRAVENOUS | Status: DC | PRN
  Administered 2022-11-26: 30 mg via INTRAVENOUS
  Administered 2022-11-26: 40 mg via INTRAVENOUS
  Administered 2022-11-26 (×2): 30 mg via INTRAVENOUS

## 2022-11-26 MED ORDER — SODIUM CHLORIDE 0.9 % IV SOLN: INTRAVENOUS | Status: AC

## 2022-11-26 MED ORDER — SODIUM CHLORIDE 0.9% FLUSH: 10.0000 mL | Freq: Two times a day (BID) | INTRAVENOUS | Status: DC

## 2022-11-26 MED ORDER — APIXABAN 5 MG PO TABS
ORAL_TABLET | ORAL | Status: AC
  Filled 2022-11-26: qty 1

## 2022-11-26 MED ORDER — SODIUM CHLORIDE 0.9 % IV SOLN: INTRAVENOUS | Status: DC | PRN

## 2022-11-26 NOTE — Progress Notes (Signed)
   11/26/22 1549  SDOH Interventions  Food Insecurity Interventions Inpatient TOC;Patient Declined   TOC spoke with pt on 11/21 about Food insecurity and provided pt with resources. Pt declined resources.

## 2022-11-26 NOTE — Progress Notes (Signed)
Physical Therapy Treatment Patient Details Name: [name] [name] MRN: [id number] DOB: March 19, 1955 Today's Date: 11/26/2022   History of Present Illness Pt is a 67 y.o. male who presented 11/20/22 as referral from his PCP secondary to uncontrolled blood pressure. PMH: A-fib on Eliquis, CAD s/p CABG, PAD, diastolic CHF, DM2 on insulin pump, HTN, HLD, depression    PT Comments  Pt resting in bed, remains confused and required cues for reorientation to place and situation. Pt agreeable to mobilize with therapy with minimal encouragement. Pt completed supine to sit with Min assist and able to maintain seated balance EOB. Mod assist for initial sit<>stand with strong posterior lean and LE's sliding anteriorly with no balance/righting reactions from pt to correct and mod assist to control lower to sit EOB. Pt able to complete lateral scoot bed>chair with CGA and min cues for sequencing multistep transfer. Pt completed repeated sit<>stands from recliner with cues for hand placement on rise and lower, Min assist fading to CGA. Pt then amb short forward and backward bout with RW for support. Min assist to steady and manage walker as pt attempting to lift off floor at times. EOS pt agreeable to remain OOB. Will continue to progress pt as able during acute stay.    If plan is discharge home, recommend the following: A little help with walking and/or transfers;A little help with bathing/dressing/bathroom;Assistance with cooking/housework;Direct supervision/assist for medications management;Direct supervision/assist for financial management;Assist for transportation;Help with stairs or ramp for entrance   Can travel by private vehicle     Yes  Equipment Recommendations  Rolling walker (2 wheels)    Recommendations for Other Services       Precautions / Restrictions Precautions Precautions: Fall Restrictions Weight Bearing Restrictions: No     Mobility  Bed Mobility Overal bed mobility: Needs  Assistance Bed Mobility: Supine to Sit           General bed mobility comments: min assist for sequencing    Transfers Overall transfer level: Needs assistance Equipment used: Rolling walker (2 wheels) Transfers: Sit to/from Stand, Bed to chair/wheelchair/BSC Sit to Stand: Mod assist, Min assist          Lateral/Scoot Transfers: Contact guard assist General transfer comment: mod assist from EOB with posterior lean and LOB; assist to control lower back to bed. Min assist to complete sit<>stand from recline. Pt required CGA for lateral scoots EOB>chair with min cues for technique.    Ambulation/Gait Ambulation/Gait assistance: Min assist Gait Distance (Feet): 6 Feet (6' frwd + 6\' bkwd) Assistive device: Rolling walker (2 wheels) Gait Pattern/deviations: Step-through pattern, Decreased step length - right, Decreased step length - left, Decreased stride length Gait velocity: decr         Stairs             Wheelchair Mobility     Tilt Bed    Modified Rankin (Stroke Patients Only)       Balance Overall balance assessment: Needs assistance Sitting-balance support: Feet supported, No upper extremity supported Sitting balance-Leahy Scale: Fair   Postural control: Posterior lean Standing balance support: Bilateral upper extremity supported, During functional activity, Reliant on assistive device for balance, No upper extremity supported Standing balance-Leahy Scale: Poor Standing balance comment: reliant on RW and assist from therapist for balance                            Cognition Arousal: Alert Behavior During Therapy: Flat affect Overall Cognitive Status:  Impaired/Different from baseline Area of Impairment: Attention, Memory, Following commands, Awareness, Safety/judgement, Problem solving, Orientation                 Orientation Level: Disoriented to, Place, Time, Situation (pt knew in [place] but did not know he is in a  hospital) Current Attention Level: Sustained Memory: Decreased short-term memory, Decreased recall of precautions Following Commands: Follows one step commands consistently, Follows one step commands inconsistently Safety/Judgement: Decreased awareness of safety, Decreased awareness of deficits Awareness: Emergent Problem Solving: Slow processing, Difficulty sequencing, Requires verbal cues, Requires tactile cues          Exercises Other Exercises Other Exercises: 10x sit<>stand with cues for technique, pt useing bil UE to power up and single UE to reach back and control lower    General Comments        Pertinent Vitals/Pain Pain Assessment Pain Assessment: Faces Faces Pain Scale: No hurt Pain Intervention(s): Limited activity within patient's tolerance, Monitored during session, Repositioned    Home Living                          Prior Function            PT Goals (current goals can now be found in the care plan section) Acute Rehab PT Goals Patient Stated Goal: to go home PT Goal Formulation: With patient Time For Goal Achievement: 12/05/22 Potential to Achieve Goals: Good Progress towards PT goals: Progressing toward goals    Frequency    Min 1X/week      PT Plan      Co-evaluation              AM-PAC PT "6 Clicks" Mobility   Outcome Measure  Help needed turning from your back to your side while in a flat bed without using bedrails?: A Little Help needed moving from lying on your back to sitting on the side of a flat bed without using bedrails?: A Little Help needed moving to and from a bed to a chair (including a wheelchair)?: A Little Help needed standing up from a chair using your arms (e.g., wheelchair or bedside chair)?: A Little Help needed to walk in hospital room?: A Little Help needed climbing 3-5 steps with a railing? : A Lot 6 Click Score: 17    End of Session Equipment Utilized During Treatment: Gait belt Activity  Tolerance: Patient tolerated treatment well Patient left: in chair;with call bell/phone within reach;with chair alarm set Nurse Communication: Mobility status;Other (comment) (hallucinating) PT Visit Diagnosis: Unsteadiness on feet (R26.81);Other abnormalities of gait and mobility (R26.89);Muscle weakness (generalized) (M62.81);Difficulty in walking, not elsewhere classified (R26.2);History of falling (Z91.81);Repeated falls (R29.6)     Time: 4098-1191 PT Time Calculation (min) (ACUTE ONLY): 31 min  Charges:    $Therapeutic Exercise: 8-22 mins $Therapeutic Activity: 8-22 mins PT General Charges $$ ACUTE PT VISIT: 1 Visit                     Wynn Maudlin, DPT Acute Rehabilitation Services Office [phone]  11/26/22 1:46 PM

## 2022-11-26 NOTE — Progress Notes (Signed)
Patient was alert and oriented x 3-4 earlier last night. When RN asked why he's in the hospital he said "Because of my depression, ah, no, Brain?" RN re oriented him and patient acknowledged. He's always forgetful (looking for his phone and cap which always been sitting on top of his bedside table). RN noticed some intermittent confusions as well during hourly rounds. No agitation or violent behavior noted. Calm, cooperative and follows commands.

## 2022-11-26 NOTE — Plan of Care (Signed)
Problem: Education: Goal: Knowledge of General Education information will improve Description: Including pain rating scale, medication(s)/side effects and non-pharmacologic comfort measures Outcome: Progressing   Problem: Clinical Measurements: Goal: Ability to maintain clinical measurements within normal limits will improve Outcome: Progressing   Problem: Clinical Measurements: Goal: Diagnostic test results will improve Outcome: Progressing   Problem: Clinical Measurements: Goal: Will remain free from infection Outcome: Progressing   Problem: Clinical Measurements: Goal: Respiratory complications will improve Outcome: Progressing   Problem: Clinical Measurements: Goal: Cardiovascular complication will be avoided Outcome: Progressing

## 2022-11-26 NOTE — Progress Notes (Signed)
PROGRESS NOTE    [name] [name]  ZOX:[id number] DOB: [birth year] DOA: 11/20/2022 PCP: Bary Leriche, PA-C    Brief Narrative:  This 67 yrs old Male with PMH significant for diabetes mellitus II, on insulin pump for last 3 years, CAD with history of CABG in 2003, hypertension, peripheral vascular disease, paroxysmal A-fib on Eliquis, with slow ventricular response, hyperlipidemia who  was sent by his PCP to ED for uncontrolled blood pressure, he has not been able to take care of himself at home, reports He had 2 episodes of hypoglycemia where he was found unconscious on the floor over last 2 weeks due to poor oral intake, as well patient has not been compliant with medications. He was found to have significantly high blood pressure in the ED 233/102. Chest x-ray significant for increased  vascular congestion >pulmonary edema, Patient required multiple medications for blood pressure control including p.o. clonidine, losartan, IV hydralazine, blood pressure remains elevated, his BNP was  elevated at 262. He was admitted for hypertensive urgency as well as recurrent hypoglycemic episodes.  Patient needs placement   Assessment & Plan:   Principal Problem:   Hypertensive emergency Active Problems:   Coronary artery disease   Benign prostatic hyperplasia without lower urinary tract symptoms   PAF (paroxysmal atrial fibrillation) (HCC)   Acute exacerbation of CHF (congestive heart failure) (HCC)   Diabetes mellitus (HCC)   Atrial fibrillation with slow ventricular response (HCC)   Heart valve vegetation  Hypertensive urgency: Patient presented with significantly elevated blood pressure due to non compliance. Continue amlodipine 10 mg daily. Continue hydralazine to 100 mg q8hr No beta-blockers given known slow ventricular response and low heart rate. Avoid clonidine due to withdrawals. Continue losartan and spironolactone. BP much improved.  Acute on chronic diastolic CHF: He  presented with volume overload on imaging, elevated BNP, most likely provoked due to above. Continue with IV Lasix 40 mg IV twice daily. Continue Aldactone 25 mg dialy. Echo 1/24 : LVEF 60 to 65%, normal LV function. Echo 11/24: LVEF 55-60%, Normal LVF Monitor daily weight, intake output charting.  Intake/Output Summary (Last 24 hours) at 11/26/2022 1341 Last data filed at 11/26/2022 1126 Gross per 24 hour  Intake 854 ml  Output 2185 ml  Net -1331 ml     Suspected vegetation: Patient was found to have vegetation on Aortic valve. Infectious disease consulted for further recommendation. Hold on Antibiotics for now as there is no leucocytosis,  No fever. Blood cultures no growth so far TEE negative for vegetation.  Diabetes mellitus, type II, uncontrolled with hypoglycemia: Son reports his father had 2 episodes of hypoglycemia over the last couple weeks. Not sure at this point if his insulin pump will be the best option, it has already been discontinued while in ED, will place on insulin sliding scale, and likely will try to put on oral agents of possible, as it would be a safer option in this patient who cannot do his insulin pump or injectable insulin as he lives alone with frequent confusions. Diabetic coordinator consulted.   Delirium : Son reports he has been more forgetful recently, Acute confusion could be in the setting of hypertensive urgency. Patient reports visual hallucinations, trying to get out of bed. Continue Ativan as needed for agitation. Psychiatry consulted for further evaluation. UDS negative   pA.Fib with slow ventricular response; Not on any heart rate control medication given slow ventricular response. Continue Eliquis.   S/P CABG (coronary artery bypass graft) CAD, CABG in 2003  Continue with aspirin and statin.  Pressure ulcer, POA: Stage 1 on buttock and stage 1 on left heel. Continue with nurse driven pressure ulcer prevention protocol.    Hypomagnesemia: Replaced.  Continue to monitor  Severe deconditioning: PT and OT recommended SNF.  Acute kidney injury: Baseline serum creatinine normal, up to 1.34 likely prerenal. Serum creatinine  1.39> 2.21 >1.83>2.07 Continue IV fluid resuscitation, monitor serum creatinine Avoid nephrotoxic medications, obtain renal ultrasound.    DVT prophylaxis: Eliquis Code Status: Full code Family Communication:  Spoke with son [name]. Disposition Plan:   Status is: Inpatient Remains inpatient appropriate because:    Patient admitted for hypertensive urgency and recurrent hypoglycemic episodes, PT and OT recommended SNF. Patient not able to take care of himself.  TEE ruled out vegetation.   Consultants:  Cardiology Psychiatry  Procedures: None  Antimicrobials:  Anti-infectives (From admission, onward)    None       Subjective: Patient was seen and examined at bedside.Overnight events noted.   Patient appears very deconditioned, blood pressure has improved.   TEE ruled out vegetation.  Patient denies any chest pain.   Objective: Vitals:   11/26/22 1129 11/26/22 1139 11/26/22 1149 11/26/22 1215  BP: (!) 116/59 118/62 119/62 (!) 116/54  Pulse: 74 69 74 70  Resp: 17 13 18 14   Temp: 98 F (36.7 C)   97.8 F (36.6 C)  TempSrc: Tympanic   Oral  SpO2: 95% 94% 94% 95%  Weight:      Height:        Intake/Output Summary (Last 24 hours) at 11/26/2022 1341 Last data filed at 11/26/2022 1126 Gross per 24 hour  Intake 854 ml  Output 2185 ml  Net -1331 ml   Filed Weights   11/24/22 0427 11/25/22 0356 11/26/22 0435  Weight: 73.6 kg 74.8 kg 72.2 kg    Examination:  General exam: Appears comfortable, deconditioned, not in any acute distress. Respiratory system: CTA bilaterally. Respiratory effort normal.  RR 15 Cardiovascular system: S1 & S2 heard, RRR. No JVD, murmurs, rubs, gallops or clicks.  Gastrointestinal system: Abdomen is non distended, soft and non  tender. Normal bowel sounds heard. Central nervous system: Alert and oriented X 2. No focal neurological deficits. Extremities: No edema, No cyanosis, No clubbing  Skin: No rashes, lesions or ulcers Psychiatry: Mood & affect appropriate.     Data Reviewed: I have personally reviewed following labs and imaging studies  CBC: Recent Labs  Lab 11/20/22 1049 11/21/22 0344 11/22/22 0322 11/23/22 0331 11/24/22 0654 11/26/22 0343  WBC 5.3 5.1 6.3 7.1 8.9 6.9  NEUTROABS 3.3 3.1  --   --   --   --   HGB 12.6* 10.8* 12.2* 12.2* 10.7* 11.4*  HCT 38.9* 32.3* 36.8* 37.6* 33.7* 36.7*  MCV 91.3 89.5 90.9 91.3 93.1 94.3  PLT 158 149* 178 194 175 248   Basic Metabolic Panel: Recent Labs  Lab 11/21/22 0344 11/22/22 0322 11/23/22 0331 11/24/22 0654 11/25/22 0256 11/26/22 0343  NA 137  --  137 138 139 135  K 3.6  --  4.2 4.7 4.0 4.5  CL 102  --  101 102 102 96*  CO2 27  --  29 26 27 28   GLUCOSE 217*  --  103* 164* 128* 204*  BUN 14  --  36* 52* 48* 59*  CREATININE 1.06  --  1.39* 2.21* 1.83* 2.07*  CALCIUM 7.9*  --  8.4* 8.3* 8.1* 8.5*  MG 1.5* 2.0 2.1 2.0  --  2.2  PHOS 4.2  --  4.1 5.0*  --  3.4   GFR: Estimated Creatinine Clearance: 34.4 mL/min (A) (by C-G formula based on SCr of 2.07 mg/dL (H)). Liver Function Tests: Recent Labs  Lab 11/20/22 1049  AST 29  ALT 8  ALKPHOS 87  BILITOT 0.6  PROT 5.7*  ALBUMIN 2.9*   No results for input(s): "LIPASE", "AMYLASE" in the last 168 hours. No results for input(s): "AMMONIA" in the last 168 hours. Coagulation Profile: No results for input(s): "INR", "PROTIME" in the last 168 hours. Cardiac Enzymes: No results for input(s): "CKTOTAL", "CKMB", "CKMBINDEX", "TROPONINI" in the last 168 hours. BNP (last 3 results) No results for input(s): "PROBNP" in the last 8760 hours. HbA1C: No results for input(s): "HGBA1C" in the last 72 hours. CBG: Recent Labs  Lab 11/25/22 2011 11/26/22 0002 11/26/22 0403 11/26/22 0907 11/26/22 1214   GLUCAP 232* 233* 199* 149* 207*   Lipid Profile: No results for input(s): "CHOL", "HDL", "LDLCALC", "TRIG", "CHOLHDL", "LDLDIRECT" in the last 72 hours. Thyroid Function Tests: No results for input(s): "TSH", "T4TOTAL", "FREET4", "T3FREE", "THYROIDAB" in the last 72 hours.  Anemia Panel: No results for input(s): "VITAMINB12", "FOLATE", "FERRITIN", "TIBC", "IRON", "RETICCTPCT" in the last 72 hours.  Sepsis Labs: No results for input(s): "PROCALCITON", "LATICACIDVEN" in the last 168 hours.  Recent Results (from the past 240 hour(s))  Culture, blood (Routine X 2) w Reflex to ID Panel     Status: None (Preliminary result)   Collection Time: 11/22/22  2:49 PM   Specimen: BLOOD RIGHT ARM  Result Value Ref Range Status   Specimen Description BLOOD RIGHT ARM  Final   Special Requests   Final    BOTTLES DRAWN AEROBIC AND ANAEROBIC Blood Culture results may not be optimal due to an inadequate volume of blood received in culture bottles   Culture   Final    NO GROWTH 4 DAYS Performed at [facility], [address] [place], [place] [address]    Report Status PENDING  Incomplete  Culture, blood (Routine X 2) w Reflex to ID Panel     Status: None (Preliminary result)   Collection Time: 11/22/22  2:51 PM   Specimen: BLOOD RIGHT ARM  Result Value Ref Range Status   Specimen Description BLOOD RIGHT ARM  Final   Special Requests   Final    BOTTLES DRAWN AEROBIC AND ANAEROBIC Blood Culture results may not be optimal due to an inadequate volume of blood received in culture bottles   Culture   Final    NO GROWTH 4 DAYS Performed at [facility], [address], [place], [place] [address]    Report Status PENDING  Incomplete     Radiology Studies: EP STUDY  Result Date: 11/26/2022 See surgical note for result.  CT ABDOMEN PELVIS WO CONTRAST  Result Date: 11/24/2022 CLINICAL DATA:  Concern for bowel obstruction, vomiting. EXAM: CT ABDOMEN AND PELVIS WITHOUT CONTRAST  TECHNIQUE: Multidetector CT imaging of the abdomen and pelvis was performed following the standard protocol without IV contrast. RADIATION DOSE REDUCTION: This exam was performed according to the departmental dose-optimization program which includes automated exposure control, adjustment of the mA and/or kV according to patient size and/or use of iterative reconstruction technique. COMPARISON:  CT abdomen pelvis dated 07/18/2021. FINDINGS: Lower chest: Moderate to large bilateral pleural effusions with associated atelectasis. There are moderate ground-glass opacities and tree-in-bud opacities in the visible lower lungs. Hepatobiliary: No focal liver abnormality is seen. No gallstones,  gallbladder wall thickening, or biliary dilatation. Pancreas: Unremarkable. No pancreatic ductal dilatation or surrounding inflammatory changes. Spleen: Normal in size without focal abnormality. Adrenals/Urinary Tract: Adrenal glands are unremarkable. Kidneys are normal, without renal calculi, focal lesion, or hydronephrosis. Bladder is unremarkable. Stomach/Bowel: Stomach is within normal limits. Appendix appears normal. No evidence of bowel wall thickening, distention, or inflammatory changes. Vascular/Lymphatic: Aortic atherosclerosis. The infrarenal abdominal aorta measures 3.0 cm in diameter, unchanged. Aneurysmal dilatation of the right common iliac artery appears unchanged, measuring 2.7 cm in diameter. No enlarged abdominal or pelvic lymph nodes. Reproductive: A penile pump with reservoir in the left hemipelvis is redemonstrated. The prostate is unremarkable. Other: No abdominal wall hernia or abnormality. No abdominopelvic ascites. Musculoskeletal: Degenerative changes are seen in the spine. IMPRESSION: 1. No acute process in the abdomen or pelvis. No evidence of bowel obstruction. 2. Moderate to large bilateral pleural effusions with associated atelectasis. Moderate ground-glass opacities and tree-in-bud opacities in the  visible lower lungs may reflect pulmonary edema or infection. 3. Infrarenal abdominal aortic aneurysm measuring 3.0 cm. Recommend follow-up ultrasound every 3 years. (Ref.: J Vasc Surg. 2018; 67:2-77 and J Am Coll Radiol 2013;10(10):789-794.) Aortic Atherosclerosis (ICD10-I70.0). Electronically Signed   By: Romona Curls M.D.   On: 11/24/2022 15:25    Scheduled Meds:  amLODipine  10 mg Oral Daily   apixaban       apixaban  5 mg Oral BID   aspirin EC  81 mg Oral Daily   atorvastatin  40 mg Oral Daily   feeding supplement  237 mL Oral BID BM   furosemide  40 mg Intravenous BID   hydrALAZINE  100 mg Oral Q8H   insulin aspart  0-9 Units Subcutaneous Q4H   losartan  50 mg Oral Daily   mirtazapine  15 mg Oral QHS   multivitamin with minerals  1 tablet Oral Daily   PARoxetine  20 mg Oral Daily   sodium chloride flush  3 mL Intravenous Q12H   spironolactone  25 mg Oral Daily   thiamine  200 mg Oral Daily   Continuous Infusions:     LOS: 5 days    Time spent: 35 mins    Willeen Niece, MD [facility]   If 7PM-7AM, please contact night-coverage

## 2022-11-26 NOTE — Inpatient Diabetes Management (Addendum)
Inpatient Diabetes Program Recommendations  AACE/ADA: New Consensus Statement on Inpatient Glycemic Control (2015)  Target Ranges:  Prepandial:   less than 140 mg/dL      Peak postprandial:   less than 180 mg/dL (1-2 hours)      Critically ill patients:  140 - 180 mg/dL   Lab Results  Component Value Date   GLUCAP 149 (H) 11/26/2022   HGBA1C 9.8 (A) 11/13/2022    Review of Glycemic Control  Latest Reference Range & Units 11/25/22 08:24 11/25/22 11:28 11/25/22 16:50 11/25/22 20:11 11/26/22 00:02 11/26/22 04:03 11/26/22 09:07  Glucose-Capillary 70 - 99 mg/dL 161 (H) 096 (H) 045 (H) 232 (H) 233 (H) 199 (H) 149 (H)  (H): Data is abnormally high  Diabetes history: DM2 Outpatient Diabetes medications:  Insulin pump  Pump   Omnipod Settings   Insulin type   Novolog     Basal rate          0000 0.85                       I:C ratio          0000 1:1      Enter # 5g                       Sensitivity          0000  55         Goal          0000  120   Current orders for Inpatient glycemic control: Novolog 0-9 units Q4H  Inpatient Diabetes Program Recommendations:    Might consider:  Novolog 3 units TID with meals if he consumes at least 50%.  Will continue to follow while inpatient.  Thank you, Dulce Sellar, MSN, CDCES Diabetes Coordinator Inpatient Diabetes Program [phone] (team pager from 8a-5p)

## 2022-11-26 NOTE — Progress Notes (Signed)
Mobility Specialist Progress Note:   11/26/22 1042  Mobility  Activity Ambulated with assistance in room;Transferred from bed to chair (bed >stretcher)  Level of Assistance +2 (takes two people) (ModA)  Assistive Device Other (Comment) (HHA)  Distance Ambulated (ft) 5 ft  Activity Response Tolerated fair  Mobility Referral Yes  $Mobility charge 1 Mobility  Mobility Specialist Start Time (ACUTE ONLY) 0910  Mobility Specialist Stop Time (ACUTE ONLY) [id number]  Mobility Specialist Time Calculation (min) (ACUTE ONLY) 12 min   Pt received in bed, RN requested assistance getting pt to stretcher. Pt needed ModA w/ bed mobility and ModA +2 to stand and transfer to bed. Was able to take a few steps and transfer to stretcher w/o fault. All needs met.  Thompson Grayer Mobility Specialist  Please contact vis Secure Chat or  Rehab Office [phone]

## 2022-11-26 NOTE — CV Procedure (Signed)
     TRANSESOPHAGEAL ECHOCARDIOGRAM   NAME:  [name] [name]   MRN: [id number] DOB:  [birth year]   ADMIT DATE: 11/20/2022  INDICATIONS: ?AV vegetation  PROCEDURE:   Informed consent was obtained prior to the procedure. The risks, benefits and alternatives for the procedure were discussed and the patient comprehended these risks.  Risks include, but are not limited to, cough, sore throat, vomiting, nausea, somnolence, esophageal and stomach trauma or perforation, bleeding, low blood pressure, aspiration, pneumonia, infection, trauma to the teeth and death.    After a procedural time-out, the oropharynx was anesthetized and the patient was sedated by the anesthesia service. The transesophageal probe was inserted in the esophagus and stomach without difficulty and multiple views were obtained. Anesthesia was monitored by Georgianne Fick, CRNA.    COMPLICATIONS:    There were no immediate complications.  FINDINGS:  Aortic valve leaflets are thickened but no vegetation seen   Epifanio Lesches MD [facility]  [address] [place], [place] [address] [phone]   11:33 AM

## 2022-11-26 NOTE — Progress Notes (Signed)
Echocardiogram Echocardiogram Transesophageal has been performed.  [name] [name] 11/26/2022, 11:46 AM

## 2022-11-26 NOTE — Anesthesia Postprocedure Evaluation (Signed)
Anesthesia Post Note  Patient: [name] [name]  Procedure(s) Performed: TRANSESOPHAGEAL ECHOCARDIOGRAM     Patient location during evaluation: PACU Anesthesia Type: MAC Level of consciousness: awake and alert and oriented Pain management: pain level controlled Vital Signs Assessment: post-procedure vital signs reviewed and stable Respiratory status: spontaneous breathing, nonlabored ventilation and respiratory function stable Cardiovascular status: stable and blood pressure returned to baseline Postop Assessment: no apparent nausea or vomiting Anesthetic complications: no   No notable events documented.  Last Vitals:  Vitals:   11/26/22 1139 11/26/22 1149  BP: 118/62 119/62  Pulse: 69 74  Resp: 13 18  Temp:    SpO2: 94% 94%    Last Pain:  Vitals:   11/26/22 1149  TempSrc:   PainSc: 0-No pain                 [name]

## 2022-11-26 NOTE — Transfer of Care (Signed)
Immediate Anesthesia Transfer of Care Note  Patient: Magdiel Nutting  Procedure(s) Performed: TRANSESOPHAGEAL ECHOCARDIOGRAM  Patient Location: PACU  Anesthesia Type:MAC  Level of Consciousness: drowsy  Airway & Oxygen Therapy: Patient Spontanous Breathing and Patient connected to nasal cannula oxygen  Post-op Assessment: Report given to RN and Post -op Vital signs reviewed and stable  Post vital signs: Reviewed and stable  Last Vitals:  Vitals Value Taken Time  BP 116/59 11/26/22 1129  Temp 36.7 C 11/26/22 1129  Pulse 70 11/26/22 1130  Resp 21 11/26/22 1130  SpO2 95 % 11/26/22 1130  Vitals shown include unfiled device data.  Last Pain:  Vitals:   11/26/22 1129  TempSrc: Tympanic  PainSc: Asleep         Complications: No notable events documented.

## 2022-11-26 NOTE — TOC Progression Note (Signed)
Transition of Care [name]) - Progression Note    Patient Details  Name: [name] [name] MRN: [id number] Date of Birth: 07/04/1955  Transition of Care [facility]) CM/SW Contact  Michaela Corner, [place] Phone Number: 11/26/2022, 10:56 AM  Clinical Narrative:   CSW spoke with pts son, [name], at this time family has chosen [facility].    Expected Discharge Plan: Home w Home Health Services Barriers to Discharge: Continued Medical Work up  Expected Discharge Plan and Services In-house Referral: NA Discharge Planning Services: CM Consult   Living arrangements for the past 2 months: Single Family Home                 DME Arranged: N/A         HH Arranged: RN, PT, Social Work HH Agency: [facility] Date HH Agency Contacted: 11/21/22 Time HH Agency Contacted: 1420 Representative spoke with at [facility] Agency: Haywood Lasso   Social Determinants of Health (SDOH) Interventions SDOH Screenings   Food Insecurity: Food Insecurity Present (11/21/2022)  Housing: Patient Unable To Answer (11/22/2022)  Transportation Needs: Patient Unable To Answer (11/22/2022)  Recent Concern: Transportation Needs - Unmet Transportation Needs (11/21/2022)  Utilities: Patient Unable To Answer (11/22/2022)  Alcohol Screen: Low Risk  (10/23/2019)  Depression (PHQ2-9): High Risk (11/21/2022)  Financial Resource Strain: Low Risk  (11/21/2022)  Physical Activity: Inactive (11/21/2022)  Social Connections: Socially Isolated (11/21/2022)  Stress: Stress Concern Present (11/21/2022)  Tobacco Use: Medium Risk (11/26/2022)  Health Literacy: Adequate Health Literacy (11/21/2022)    Readmission Risk Interventions     No data to display

## 2022-11-26 NOTE — Interval H&P Note (Signed)
History and Physical Interval Note:  11/26/2022 11:00 AM  [name] [name]  has presented today for surgery, with the diagnosis of bacteremia.  The various methods of treatment have been discussed with the patient and family. After consideration of risks, benefits and other options for treatment, the patient has consented to  Procedure(s): TRANSESOPHAGEAL ECHOCARDIOGRAM (N/A) as a surgical intervention.  The patient's history has been reviewed, patient examined, no change in status, stable for surgery.  I have reviewed the patient's chart and labs.  Questions were answered to the patient's satisfaction.     [name]

## 2022-11-26 NOTE — Plan of Care (Signed)
Problem: Clinical Measurements: Goal: Will remain free from infection Outcome: Progressing   Problem: Clinical Measurements: Goal: Diagnostic test results will improve Outcome: Progressing   Problem: Clinical Measurements: Goal: Respiratory complications will improve Outcome: Progressing

## 2022-11-26 NOTE — Progress Notes (Signed)
Speech Language Pathology Treatment: Dysphagia  Patient Details Name: [name] [name] MRN: [id number] DOB: Apr 03, 1955 Today's Date: 11/26/2022 Time: 6962-9528 SLP Time Calculation (min) (ACUTE ONLY): 14 min  Assessment / Plan / Recommendation Clinical Impression  [name] [name] was seen for swallowing s/p TEE.  He was alert and participatory. Demonstrated improved eye contact and responsiveness today. He was able to open package of crackers and feed himself independently.  He drank nectar thick liquids followed by 4 oz of thin water with no s/s of aspiration. Sequential sips and drinking in conjunction with eating solids did not impact function - he continued to protect his airway. Recommend liberalizing liquids back to thin. D/W RN.  SLP will follow.    HPI HPI: [name] [name] is a 67 y.o. male who presented 11/20/22 as referral from his PCP secondary to uncontrolled blood pressure. CT head 11/19 with no acute findings.  Pt with presumed aspiration event following episode of vomiting overnight 11/22. CXR 11/23: "Similar findings of pulmonary edema with moderate RIGHT and small  LEFT pleural effusions and favored scattered atelectasis." PMH: A-fib on Eliquis, CAD s/p CABG, PAD, diastolic CHF, DM2 on insulin pump, HTN, HLD, depression.      SLP Plan  Continue with current plan of care      Recommendations for follow up therapy are one component of a multi-disciplinary discharge planning process, led by the attending physician.  Recommendations may be updated based on patient status, additional functional criteria and insurance authorization.    Recommendations  Diet recommendations: Regular;Thin liquid Liquids provided via: Straw;Cup Medication Administration: Whole meds with liquid Supervision: Staff to assist with self feeding Compensations: Slow rate;Small sips/bites                  Oral care BID   Frequent or constant Supervision/Assistance Dysphagia, oropharyngeal phase  (R13.12)     Continue with current plan of care    Tenika Keeran L. Samson Frederic, MA CCC/SLP Clinical Specialist - Acute Care SLP Acute Rehabilitation Services Office number [phone]  [name]  11/26/2022, 3:19 PM

## 2022-11-26 NOTE — Consult Note (Signed)
[facility] Face-to-Face Psychiatry Consult   Reason for Consult: Agitation and aggression and hallucinations. Referring Physician:  Dr. Willeen Niece Patient Identification: [name] [name] MRN:  [id number] Principal Diagnosis: Hypertensive emergency Diagnosis:  Principal Problem:   Hypertensive emergency Active Problems:   Coronary artery disease   Benign prostatic hyperplasia without lower urinary tract symptoms   PAF (paroxysmal atrial fibrillation) (HCC)   Acute exacerbation of CHF (congestive heart failure) (HCC)   Diabetes mellitus (HCC)   Atrial fibrillation with slow ventricular response (HCC)   Heart valve vegetation   Total Time spent with patient: 30 minutes  Subjective:   [name] [name] is a 67 y.o. male patient admitted with hypertensive emergency, and suspected aortic valve endocarditis.   HPI: Chart was reviewed and patient was interviewed.  Patient reports he was admitted for high blood pressure.  Patient was able to state his age is 67 years old and that he is located [facility].  Patient reports that he is doing "pretty good".  Patient reports that he overdid his Eliquis.  Patient was very confused throughout interview and looking around the room during interview.  Patient did not answer questions appropriately, and did not seem to understand questions as well.  Patient denied suicidal ideation, homicidal ideation, and auditory or visual hallucinations.  Patient randomly stated "I cannot drive".  Patient states he lives alone in [place] and that he is not married.  Patient reports he has 5 kids and that they always needs something from him.  It was difficult to obtain a thorough history from patient due to his confusion.  Patient denied any drug or alcohol use.  Patient is observed to be sitting upright in the chair, resting and does awaken easily.  He does engage with this psychiatric provider, it is noted patient is hard of hearing.  Therefore the  repetition of questions is necessary.  He is alert and oriented x 3, understands he is in [facility]; month November, and identified today's date with prompting of today's calendar on the wall.  Patient states that he is tired at this time, endorses poor sleep both day and night.  Otherwise he describes his mood as  "okay, could be better if ".  His affect is congruent.  He denies any hallucinations, psychosis, and or confusion "dreams".  He further denies any suicidality, homicidality.  On today's reassessment patient did not present with any focal neurological deficit, he was able to speak clearly, follow commands, and denies weakness.   At this current time, patient's pre-existing condition that was substantially aggravated prior to this admission is returning to previous level.  Per chart review patient does appear to have some notable history of confusion, prior to delirium and or hospital admission.  Patient further states that his current health at this present time seems to be normal as he is noted to feel better and almost like his self again.  He does continue to endorse some mental fog, overall assessing is doing better.  He does not present with any decrease in attention or concentration, shows no short-term memory deficit.  Although he is unable to recall his previous actions that resulted in hospitalization, and or confabulation and his delusions.  He is not lethargic on today's exam, shows linear thought processes, and answers all questions appropriately.  At this time it is felt that patient has returned to psychiatric baseline.  Past Psychiatric History: Patient denied any significant history, per chart review notes a history of anxiety and  depression.  Chart reviewed notes that patient is currently taking Paxil and Remeron.  Patient denied any suicide attempts or psychiatric hospitalizations.  Risk to Self:   Risk to Others:   Prior Inpatient Therapy:   Prior Outpatient Therapy:     Past Medical History:  Past Medical History:  Diagnosis Date   Anxiety    Arthritis    Cataract    CHF (congestive heart failure) (HCC)    Constipation    pt states goes EOD- hard stools    Coronary artery disease    Depression    Diabetes mellitus without complication (HCC)    Diabetic retinopathy (HCC)    DKA (diabetic ketoacidoses)    Hyperlipidemia    Hypertension    off meds   Hypertensive retinopathy    Non-intractable vomiting without nausea 06/10/2018   Postherpetic neuralgia 08/11/2021   Speculative diagnosis   Psoriasis    Tubular adenoma of colon 03/2019    Past Surgical History:  Procedure Laterality Date   ABDOMINAL AORTOGRAM W/LOWER EXTREMITY N/A 08/30/2020   Procedure: ABDOMINAL AORTOGRAM W/LOWER EXTREMITY;  Surgeon: Nada Libman, MD;  Location: [facility] INVASIVE [facility];  Service: Cardiovascular;  Laterality: N/A;   ABDOMINAL AORTOGRAM W/LOWER EXTREMITY N/A 11/15/2020   Procedure: ABDOMINAL AORTOGRAM W/LOWER EXTREMITY;  Surgeon: Nada Libman, MD;  Location: [facility];  Service: Cardiovascular;  Laterality: N/A;   ANKLE SURGERY     right   CARDIAC CATHETERIZATION  09/25/2001   COLONOSCOPY     ~10 yr ago- normal  per pt    CORONARY ARTERY BYPASS GRAFT  09/30/2001   CABG x 4   IR THORACENTESIS ASP PLEURAL SPACE W/IMG GUIDE  05/19/2020   LAPAROSCOPIC INGUINAL HERNIA REPAIR Bilateral 02/09/2010   MASS EXCISION Left 03/12/2013   Procedure: LEFT INDEX EXCISION MASS AND DEBRIDMENT DISTAL INTERPHALANGEAL JOINT;  Surgeon: Tami Ribas, MD;  Location: [facility];  Service: Orthopedics;  Laterality: Left;   PERIPHERAL VASCULAR BALLOON ANGIOPLASTY Left 08/30/2020   Procedure: PERIPHERAL VASCULAR BALLOON ANGIOPLASTY;  Surgeon: Nada Libman, MD;  Location: [facility];  Service: Cardiovascular;  Laterality: Left;  Peroneal   TRANSESOPHAGEAL ECHOCARDIOGRAM (CATH LAB) N/A 11/26/2022   Procedure: TRANSESOPHAGEAL ECHOCARDIOGRAM;  Surgeon:  [name], MD;  Location: [facility] INVAS[facility] CV [facility];  Service: Cardiovascular;  Laterality: N/A;   Family History:  Family History  Problem Relation Age of Onset   Lymphoma Father    Cancer Paternal Grandmother        Colon Cancer   Colon cancer Paternal Grandmother    Colon polyps Neg Hx    Esophageal cancer Neg Hx    Rectal cancer Neg Hx    Stomach cancer Neg Hx    Diabetes Mellitus I Neg Hx    Pancreatic cancer Neg Hx    Family Psychiatric  History: Unknown Social History:  Social History   Substance and Sexual Activity  Alcohol Use Yes   Comment: about once a year     Social History   Substance and Sexual Activity  Drug Use No    Social History   Socioeconomic History   Marital status: Divorced    Spouse name: Not on file   Number of children: 4   Years of education: 12   Highest education level: 12th grade  Occupational History   Occupation: Retired  Tobacco Use   Smoking status: Former    Types: Cigars    Quit date: 2022  Years since quitting: 2.9   Smokeless tobacco: Never  Vaping Use   Vaping status: Never Used  Substance and Sexual Activity   Alcohol use: Yes    Comment: about once a year   Drug use: No   Sexual activity: Yes  Other Topics Concern   Not on file  Social History Narrative   Lives with his adult son [name].   Has 4 children, 2 sons- both live in [place], 2 daughters one lives in [place], [place] and youngest daughter attends [employer].    Fiance lives in  [place], [place]   Social Determinants of Health   Financial Resource Strain: Low Risk  (11/21/2022)   Overall Financial Resource Strain (CARDIA)    Difficulty of Paying Living Expenses: Not very hard  Food Insecurity: Food Insecurity Present (11/21/2022)   Hunger Vital Sign    Worried About Running Out of Food in the Last Year: Sometimes true    Ran Out of Food in the Last Year: Sometimes true  Transportation Needs: Patient Unable To Answer  (11/22/2022)   PRAPARE - Transportation    Lack of Transportation (Medical): Patient unable to answer    Lack of Transportation (Non-Medical): Patient unable to answer  Recent Concern: Transportation Needs - Unmet Transportation Needs (11/21/2022)   PRAPARE - Administrator, Civil Service (Medical): Yes    Lack of Transportation (Non-Medical): Yes  Physical Activity: Inactive (11/21/2022)   Exercise Vital Sign    Days of Exercise per Week: 0 days    Minutes of Exercise per Session: 0 min  Stress: Stress Concern Present (11/21/2022)   [employer] of Occupational Health - Occupational Stress Questionnaire    Feeling of Stress : Very much  Social Connections: Socially Isolated (11/21/2022)   Social Connection and Isolation Panel [NHANES]    Frequency of Communication with Friends and Family: Once a week    Frequency of Social Gatherings with Friends and Family: Once a week    Attends Religious Services: Never    Database administrator or Organizations: No    Attends Banker Meetings: Never    Marital Status: Divorced   Additional Social History:    Allergies:   Allergies  Allergen Reactions   Wool Alcohol [Lanolin] Hives and Itching    Wool fabric    Labs:  Results for orders placed or performed during the hospital encounter of 11/20/22 (from the past 48 hour(s))  Glucose, capillary     Status: Abnormal   Collection Time: 11/24/22  7:59 PM  Result Value Ref Range   Glucose-Capillary 162 (H) 70 - 99 mg/dL    Comment: Glucose reference range applies only to samples taken after fasting for at least 8 hours.  Glucose, capillary     Status: Abnormal   Collection Time: 11/25/22 12:29 AM  Result Value Ref Range   Glucose-Capillary 124 (H) 70 - 99 mg/dL    Comment: Glucose reference range applies only to samples taken after fasting for at least 8 hours.  Basic metabolic panel     Status: Abnormal   Collection Time: 11/25/22  2:56 AM  Result Value Ref  Range   Sodium 139 135 - 145 mmol/L   Potassium 4.0 3.5 - 5.1 mmol/L   Chloride 102 98 - 111 mmol/L   CO2 27 22 - 32 mmol/L   Glucose, Bld 128 (H) 70 - 99 mg/dL    Comment: Glucose reference range applies only to samples taken after fasting for  at least 8 hours.   BUN 48 (H) 8 - 23 mg/dL   Creatinine, Ser 9.81 (H) 0.61 - 1.24 mg/dL   Calcium 8.1 (L) 8.9 - 10.3 mg/dL   GFR, Estimated 39 (L) >60 mL/min    Comment: (NOTE) Calculated using the CKD-EPI Creatinine Equation (2021)    Anion gap 10 5 - 15    Comment: Performed at [facility], [address] [place], [place] [address]  Glucose, capillary     Status: Abnormal   Collection Time: 11/25/22  3:54 AM  Result Value Ref Range   Glucose-Capillary 104 (H) 70 - 99 mg/dL    Comment: Glucose reference range applies only to samples taken after fasting for at least 8 hours.  Glucose, capillary     Status: Abnormal   Collection Time: 11/25/22  8:24 AM  Result Value Ref Range   Glucose-Capillary 131 (H) 70 - 99 mg/dL    Comment: Glucose reference range applies only to samples taken after fasting for at least 8 hours.  Glucose, capillary     Status: Abnormal   Collection Time: 11/25/22 11:28 AM  Result Value Ref Range   Glucose-Capillary 225 (H) 70 - 99 mg/dL    Comment: Glucose reference range applies only to samples taken after fasting for at least 8 hours.  Glucose, capillary     Status: Abnormal   Collection Time: 11/25/22  4:50 PM  Result Value Ref Range   Glucose-Capillary 260 (H) 70 - 99 mg/dL    Comment: Glucose reference range applies only to samples taken after fasting for at least 8 hours.  Glucose, capillary     Status: Abnormal   Collection Time: 11/25/22  8:11 PM  Result Value Ref Range   Glucose-Capillary 232 (H) 70 - 99 mg/dL    Comment: Glucose reference range applies only to samples taken after fasting for at least 8 hours.   Comment 1 Notify RN    Comment 2 Document in Chart   Glucose, capillary     Status:  Abnormal   Collection Time: 11/26/22 12:02 AM  Result Value Ref Range   Glucose-Capillary 233 (H) 70 - 99 mg/dL    Comment: Glucose reference range applies only to samples taken after fasting for at least 8 hours.   Comment 1 Notify RN    Comment 2 Document in Chart   CBC     Status: Abnormal   Collection Time: 11/26/22  3:43 AM  Result Value Ref Range   WBC 6.9 4.0 - 10.5 K/uL   RBC 3.89 (L) 4.22 - 5.81 MIL/uL   Hemoglobin 11.4 (L) 13.0 - 17.0 g/dL   HCT 82.9 (L) 56.2 - 13.0 %   MCV 94.3 80.0 - 100.0 fL   MCH 29.3 26.0 - 34.0 pg   MCHC 31.1 30.0 - 36.0 g/dL   RDW 86.5 78.4 - 69.6 %   Platelets 248 150 - 400 K/uL   nRBC 0.0 0.0 - 0.2 %    Comment: Performed at [facility], [address] [place], [place] [address]  Basic metabolic panel     Status: Abnormal   Collection Time: 11/26/22  3:43 AM  Result Value Ref Range   Sodium 135 135 - 145 mmol/L   Potassium 4.5 3.5 - 5.1 mmol/L   Chloride 96 (L) 98 - 111 mmol/L   CO2 28 22 - 32 mmol/L   Glucose, Bld 204 (H) 70 - 99 mg/dL    Comment: Glucose reference  range applies only to samples taken after fasting for at least 8 hours.   BUN 59 (H) 8 - 23 mg/dL   Creatinine, Ser 6.04 (H) 0.61 - 1.24 mg/dL   Calcium 8.5 (L) 8.9 - 10.3 mg/dL   GFR, Estimated 34 (L) >60 mL/min    Comment: (NOTE) Calculated using the CKD-EPI Creatinine Equation (2021)    Anion gap 11 5 - 15    Comment: Performed at [facility], [address], [place], [place] [address]  Phosphorus     Status: None   Collection Time: 11/26/22  3:43 AM  Result Value Ref Range   Phosphorus 3.4 2.5 - 4.6 mg/dL    Comment: Performed at [facility], [address], [place], [place] [address]  Magnesium     Status: None   Collection Time: 11/26/22  3:43 AM  Result Value Ref Range   Magnesium 2.2 1.7 - 2.4 mg/dL    Comment: Performed at [facility], [address], [place], [place] [address]  Glucose, capillary     Status: Abnormal   Collection  Time: 11/26/22  4:03 AM  Result Value Ref Range   Glucose-Capillary 199 (H) 70 - 99 mg/dL    Comment: Glucose reference range applies only to samples taken after fasting for at least 8 hours.   Comment 1 Notify RN    Comment 2 Document in Chart   Glucose, capillary     Status: Abnormal   Collection Time: 11/26/22  9:07 AM  Result Value Ref Range   Glucose-Capillary 149 (H) 70 - 99 mg/dL    Comment: Glucose reference range applies only to samples taken after fasting for at least 8 hours.  Glucose, capillary     Status: Abnormal   Collection Time: 11/26/22 12:14 PM  Result Value Ref Range   Glucose-Capillary 207 (H) 70 - 99 mg/dL    Comment: Glucose reference range applies only to samples taken after fasting for at least 8 hours.  Glucose, capillary     Status: Abnormal   Collection Time: 11/26/22  3:25 PM  Result Value Ref Range   Glucose-Capillary 207 (H) 70 - 99 mg/dL    Comment: Glucose reference range applies only to samples taken after fasting for at least 8 hours.    Current Facility-Administered Medications  Medication Dose Route Frequency Provider Last Rate Last Admin   0.9 %  sodium chloride infusion   Intravenous Continuous Willeen Niece, MD 40 mL/hr at 11/26/22 1507 New Bag at 11/26/22 1507   acetaminophen (TYLENOL) tablet 650 mg  650 mg Oral Q4H PRN Elgergawy, Leana Roe, MD   650 mg at 11/26/22 0455   amLODipine (NORVASC) tablet 10 mg  10 mg Oral Daily Elgergawy, Leana Roe, MD   10 mg at 11/26/22 1317   apixaban (ELIQUIS) 5 MG tablet            apixaban (ELIQUIS) tablet 5 mg  5 mg Oral BID Elgergawy, Leana Roe, MD   5 mg at 11/26/22 6213   aspirin EC tablet 81 mg  81 mg Oral Daily Elgergawy, Leana Roe, MD   81 mg at 11/26/22 1316   atorvastatin (LIPITOR) tablet 40 mg  40 mg Oral Daily Elgergawy, Leana Roe, MD   40 mg at 11/26/22 1317   feeding supplement (ENSURE ENLIVE / ENSURE PLUS) liquid 237 mL  237 mL Oral BID BM Willeen Niece, MD   237 mL at 11/26/22 1324   hydrALAZINE  (APRESOLINE)  injection 10 mg  10 mg Intravenous Q4H PRN Elgergawy, Leana Roe, MD       hydrALAZINE (APRESOLINE) tablet 100 mg  100 mg Oral Q8H Khatri, Pardeep, MD   100 mg at 11/26/22 1318   insulin aspart (novoLOG) injection 0-9 Units  0-9 Units Subcutaneous Q4H John Giovanni, MD   3 Units at 11/26/22 1315   LORazepam (ATIVAN) injection 1 mg  1 mg Intravenous Q6H PRN Willeen Niece, MD   1 mg at 11/22/22 1618   losartan (COZAAR) tablet 50 mg  50 mg Oral Daily Elgergawy, Leana Roe, MD   50 mg at 11/26/22 1318   mirtazapine (REMERON) tablet 15 mg  15 mg Oral QHS Elgergawy, Leana Roe, MD   15 mg at 11/25/22 2124   multivitamin with minerals tablet 1 tablet  1 tablet Oral Daily Willeen Niece, MD   1 tablet at 11/26/22 1319   ondansetron (ZOFRAN) injection 4 mg  4 mg Intravenous Q6H PRN Elgergawy, Leana Roe, MD   4 mg at 11/25/22 2208   PARoxetine (PAXIL) tablet 20 mg  20 mg Oral Daily Elgergawy, Leana Roe, MD   20 mg at 11/26/22 1319   sodium chloride flush (NS) 0.9 % injection 3 mL  3 mL Intravenous Q12H Elgergawy, Leana Roe, MD   3 mL at 11/25/22 2249   sodium chloride flush (NS) 0.9 % injection 3 mL  3 mL Intravenous PRN Elgergawy, Leana Roe, MD       spironolactone (ALDACTONE) tablet 25 mg  25 mg Oral Daily Elgergawy, Leana Roe, MD   25 mg at 11/26/22 1319   thiamine (VITAMIN B1) tablet 200 mg  200 mg Oral Daily Elgergawy, Leana Roe, MD   200 mg at 11/26/22 1317    Musculoskeletal: Strength & Muscle Tone:  Patient is up to chair Gait & Station:  Patient is up to chair Patient leans: N/A    Psychiatric Specialty Exam:  Presentation  General Appearance:  Appropriate for Environment; Fairly Groomed  Eye Contact: Fair  Speech: Clear and Coherent; Normal Rate  Speech Volume: Normal  Handedness: Right   Mood and Affect  Mood: Euthymic  Affect: Appropriate; Congruent   Thought Process  Thought Processes: Coherent; Linear  Descriptions of  Associations:Intact  Orientation:Full (Time, Place and Person)  Thought Content:Logical  History of Schizophrenia/Schizoaffective disorder:denies Duration of Psychotic Symptoms:denie Hallucinations:Hallucinations: None  Ideas of Reference:None  Suicidal Thoughts:Suicidal Thoughts: No  Homicidal Thoughts:Homicidal Thoughts: No   Sensorium  Memory: Immediate Fair; Recent Fair  Judgment: Fair  Insight: Fair   Art therapist  Concentration: Fair  Attention Span: Good  Recall: Good  Fund of Knowledge: Fair  Language: Fair   Psychomotor Activity  Psychomotor Activity: Psychomotor Activity: Normal   Assets  Assets:Communication Skills; Desire for Improvement; Financial Resources/Insurance; Housing; Social Support   Sleep  Sleep: Sleep: Poor   Physical Exam: Physical Exam Vitals and nursing note reviewed.  HENT:     Ears:     Comments: Hard of hearing.  Neurological:     Mental Status: He is alert.  Psychiatric:        Mood and Affect: Mood normal.        Behavior: Behavior normal.        Thought Content: Thought content normal.        Judgment: Judgment normal.    Review of Systems  Psychiatric/Behavioral:  The patient has insomnia.   All other systems reviewed and are negative.  Blood pressure (!) 116/54, pulse 70,  temperature 97.8 F (36.6 C), temperature source Oral, resp. rate 14, height 6\' 2"  (1.88 m), weight 72.2 kg, SpO2 95%. Body mass index is 20.44 kg/m.  Treatment Plan Summary: Daily contact with patient to assess and evaluate symptoms and progress in treatment and Plan see below  Patient is hospitalized for suspected aortic valve endocarditis, with an  EKG on 11/19 showing a QTc of 534. Given the risks and benefits, benzodiazepine therapy is currently recommended. If agitation and aggression persist, IV Depakene may be considered. A CT scan on admission showed no acute findings. Considering his medical history (DM type 1,  proliferative neuropathy, small vessel disease, and cardiac issues), mild cognitive impairment/delirium and/ or multifactorial encephalopathy are possible diagnoses at this time.    Substance use screen was negative.    -Repeat EKG on 11/24/2022 showed QTC 428, which has improved -Will discontinue Ativan as needed, patient has not received dose since November 21.  Will start as needed hydroxyzine 25 mg p.o. 3 times daily as needed for anxiety and sleep. -Delirium has resolved, continued delirium precautions. -Continue patient's home psych medications of Paxil and Remeron for now.  Psychiatry consult service to sign off at this time.   Disposition: Patient does not meet criteria for psychiatric inpatient admission. Discussed crisis plan, support from social network, calling 911, coming to the Emergency Department, and calling Suicide Hotline.  Maryagnes Amos, FNP 11/26/2022 3:52 PM

## 2022-11-26 NOTE — Progress Notes (Signed)
SLP Cancellation Note  Patient Details Name: [name] [name] MRN: [id number] DOB: September 19, 1955   Cancelled treatment:        Spoke with RN.  Pt has TEE scheduled for 1030 this morning.  Pt is NPO at this time and cannot participate in PO trials.    Kerrie Pleasure, MA, CCC-SLP Acute Rehabilitation Services Office: [phone] 11/26/2022, 8:00 AM

## 2022-11-26 NOTE — Progress Notes (Signed)
Patient vomited around 2200, approximately 5ml of undigested food and pudding, PRN IV med given. RN made a round and seen patient vomited again approximately 30ml. BP 119/62 HR 63 Temp 98.6 o2 sat 94 on 4LPM [employer]. MD notified.

## 2022-11-27 DIAGNOSIS — I161 Hypertensive emergency: Secondary | ICD-10-CM | POA: Diagnosis not present

## 2022-11-27 LAB — RENAL FUNCTION PANEL
Albumin: 2 g/dL — ABNORMAL LOW (ref 3.5–5.0)
Anion gap: 6 (ref 5–15)
BUN: 61 mg/dL — ABNORMAL HIGH (ref 8–23)
CO2: 29 mmol/L (ref 22–32)
Calcium: 7.9 mg/dL — ABNORMAL LOW (ref 8.9–10.3)
Chloride: 101 mmol/L (ref 98–111)
Creatinine, Ser: 2.04 mg/dL — ABNORMAL HIGH (ref 0.61–1.24)
GFR, Estimated: 35 mL/min — ABNORMAL LOW (ref >60)
Glucose, Bld: 161 mg/dL — ABNORMAL HIGH (ref 70–99)
Phosphorus: 3.4 mg/dL (ref 2.5–4.6)
Potassium: 4.3 mmol/L (ref 3.5–5.1)
Sodium: 136 mmol/L (ref 135–145)

## 2022-11-27 LAB — GLUCOSE, CAPILLARY
Glucose-Capillary: 134 mg/dL — ABNORMAL HIGH (ref 70–99)
Glucose-Capillary: 138 mg/dL — ABNORMAL HIGH (ref 70–99)
Glucose-Capillary: 141 mg/dL — ABNORMAL HIGH (ref 70–99)
Glucose-Capillary: 142 mg/dL — ABNORMAL HIGH (ref 70–99)
Glucose-Capillary: 158 mg/dL — ABNORMAL HIGH (ref 70–99)
Glucose-Capillary: 165 mg/dL — ABNORMAL HIGH (ref 70–99)
Glucose-Capillary: 169 mg/dL — ABNORMAL HIGH (ref 70–99)

## 2022-11-27 LAB — CULTURE, BLOOD (ROUTINE X 2)
Culture: NO GROWTH
Culture: NO GROWTH

## 2022-11-27 MED ORDER — INSULIN ASPART 100 UNIT/ML IJ SOLN
0.0000 [IU] | Freq: Three times a day (TID) | INTRAMUSCULAR | Status: DC
  Administered 2022-11-27: 1 [IU] via SUBCUTANEOUS
  Administered 2022-11-27 – 2022-11-28 (×3): 2 [IU] via SUBCUTANEOUS
  Administered 2022-11-28: 5 [IU] via SUBCUTANEOUS
  Administered 2022-11-28: 3 [IU] via SUBCUTANEOUS
  Administered 2022-11-29: 2 [IU] via SUBCUTANEOUS
  Administered 2022-11-29 (×2): 1 [IU] via SUBCUTANEOUS
  Administered 2022-11-29: 2 [IU] via SUBCUTANEOUS
  Administered 2022-11-30 (×2): 5 [IU] via SUBCUTANEOUS

## 2022-11-27 MED ORDER — APIXABAN 5 MG PO TABS
5.0000 mg | ORAL_TABLET | Freq: Two times a day (BID) | ORAL | Status: DC
  Administered 2022-11-27 – 2022-11-30 (×7): 5 mg via ORAL
  Filled 2022-11-27 (×6): qty 1

## 2022-11-27 NOTE — Progress Notes (Signed)
Mobility Specialist Progress Note:    11/27/22 1036  Mobility  Activity Ambulated with assistance in room;Transferred from bed to chair  Level of Assistance Minimal assist, patient does 75% or more (+2 safety)  Assistive Device Front wheel walker  Distance Ambulated (ft) 20 ft  Activity Response Tolerated well  Mobility Referral Yes  $Mobility charge 1 Mobility  Mobility Specialist Start Time (ACUTE ONLY) 1005  Mobility Specialist Stop Time (ACUTE ONLY) 1018  Mobility Specialist Time Calculation (min) (ACUTE ONLY) 13 min   Received in bed agreeable to mobility. Pt needed MinA+2 to stand and steady. Was able to ambulate around the bed to the chair w/o fault. Call bell and personal belongings in reach. Chair alarm on w/ all needs met.  Thompson Grayer Mobility Specialist  Please contact vis Secure Chat or  Rehab Office [phone]

## 2022-11-27 NOTE — Progress Notes (Signed)
SLP Cancellation Note  Patient Details Name: [name] [name] MRN: [id number] DOB: 1955-11-02   Cancelled treatment: Pt politely declined treatment session stating he was very tired and wanted to sleep.   Corabelle Spackman L. Samson Frederic, MA CCC/SLP Clinical Specialist - Acute Care SLP Acute Rehabilitation Services Office number [phone]           [name] 11/27/2022, 12:44 PM

## 2022-11-27 NOTE — Progress Notes (Signed)
Occupational Therapy Treatment Patient Details Name: [name] [name] MRN: [id number] DOB: [birth year] Today's Date: 11/27/2022   History of present illness Pt is a 67 y.o. male who presented 11/20/22 as referral from his PCP secondary to uncontrolled blood pressure. PMH: A-fib on Eliquis, CAD s/p CABG, PAD, diastolic CHF, DM2 on insulin pump, HTN, HLD, depression   OT comments  Pt making gradual progress towards OT goals. Pt able to demonstrate improvements in mobility and standing tolerance for ADLs at sink. However, pt continues to require consistent hands on assist to correct balance and prevent falls. Pt also requires cues for safety and to show insight into deficits.       If plan is discharge home, recommend the following:  A lot of help with walking and/or transfers;A lot of help with bathing/dressing/bathroom;Assistance with cooking/housework;Assistance with feeding;Direct supervision/assist for medications management;Direct supervision/assist for financial management;Assist for transportation;Help with stairs or ramp for entrance;Supervision due to cognitive status   Equipment Recommendations  None recommended by OT    Recommendations for Other Services      Precautions / Restrictions Precautions Precautions: Fall Restrictions Weight Bearing Restrictions: No       Mobility Bed Mobility               General bed mobility comments: in chair on entry    Transfers Overall transfer level: Needs assistance Equipment used: Rolling walker (2 wheels) Transfers: Sit to/from Stand Sit to Stand: Min assist, Mod assist                 Balance Overall balance assessment: Needs assistance Sitting-balance support: Feet supported, No upper extremity supported Sitting balance-Leahy Scale: Fair     Standing balance support: Bilateral upper extremity supported, During functional activity, Reliant on assistive device for balance, No upper extremity supported Standing  balance-Leahy Scale: Poor                             ADL either performed or assessed with clinical judgement   ADL Overall ADL's : Needs assistance/impaired Eating/Feeding: Set up;Sitting Eating/Feeding Details (indicate cue type and reason): assist to cut up food, open containers Grooming: Minimal assistance;Standing;Oral care;Wash/dry face Grooming Details (indicate cue type and reason): assist for balance, cues for use of L hand to place toothpaste on toothbrush. At least Min A for balance with LUE supported on sink                             Functional mobility during ADLs: Minimal assistance;Moderate assistance;Rolling walker (2 wheels);+2 for safety/equipment      Extremity/Trunk Assessment Upper Extremity Assessment Upper Extremity Assessment: Generalized weakness;Right hand dominant   Lower Extremity Assessment Lower Extremity Assessment: Defer to PT evaluation        Vision   Vision Assessment?: Vision impaired- to be further tested in functional context Additional Comments: reports planned cataract removal(?), reports unable to see self in mirror - reports blurry and that his vision is bad at baseline   Perception     Praxis      Cognition Arousal: Alert Behavior During Therapy: Flat affect Overall Cognitive Status: Impaired/Different from baseline Area of Impairment: Attention, Memory, Following commands, Awareness, Safety/judgement, Problem solving, Orientation                 Orientation Level: Disoriented to, Situation Current Attention Level: Sustained Memory: Decreased short-term memory, Decreased recall of precautions Following  Commands: Follows one step commands consistently, Follows one step commands inconsistently Safety/Judgement: Decreased awareness of safety, Decreased awareness of deficits Awareness: Emergent Problem Solving: Slow processing, Difficulty sequencing, Requires verbal cues, Requires tactile  cues General Comments: no insight into balance deficits, following directions consistently and more appropriate sequencing of familiar daily tasks        Exercises      Shoulder Instructions       General Comments reports some dizziness standing at sink, BP 106/60s    Pertinent Vitals/ Pain       Pain Assessment Pain Assessment: No/denies pain  Home Living                                          Prior Functioning/Environment              Frequency  Min 1X/week        Progress Toward Goals  OT Goals(current goals can now be found in the care plan section)  Progress towards OT goals: Progressing toward goals  Acute Rehab OT Goals Patient Stated Goal: go home soon OT Goal Formulation: Patient unable to participate in goal setting Time For Goal Achievement: 12/05/22 Potential to Achieve Goals: Fair ADL Goals Pt Will Perform Lower Body Bathing: with supervision;sitting/lateral leans;sit to/from stand Pt Will Perform Lower Body Dressing: with supervision;sitting/lateral leans;sit to/from stand Pt Will Transfer to Toilet: with supervision;ambulating;regular height toilet Pt Will Perform Toileting - Clothing Manipulation and hygiene: with supervision;sitting/lateral leans;sit to/from stand Additional ADL Goal #1: Patient will be able to complete cognitive assessment without errors in order to return home independently. Additional ADL Goal #2: Patient will be able to demonstrate approrpiate motor planning to complete basic mobility and ADL tasks without need for multi-modal cueing.  Plan      Co-evaluation                 AM-PAC OT "6 Clicks" Daily Activity     Outcome Measure   Help from another person eating meals?: A Little Help from another person taking care of personal grooming?: A Little Help from another person toileting, which includes using toliet, bedpan, or urinal?: A Little Help from another person bathing (including washing,  rinsing, drying)?: A Lot Help from another person to put on and taking off regular upper body clothing?: A Little Help from another person to put on and taking off regular lower body clothing?: A Lot 6 Click Score: 16    End of Session Equipment Utilized During Treatment: Gait belt;Rolling walker (2 wheels)  OT Visit Diagnosis: Unsteadiness on feet (R26.81);Other abnormalities of gait and mobility (R26.89);Repeated falls (R29.6);Muscle weakness (generalized) (M62.81);History of falling (Z91.81);Other symptoms and signs involving cognitive function   Activity Tolerance Patient tolerated treatment well   Patient Left in chair;with call bell/phone within reach;with chair alarm set   Nurse Communication Mobility status        Time: 3086-5784 OT Time Calculation (min): 24 min  Charges: OT General Charges $OT Visit: 1 Visit OT Treatments $Self Care/Home Management : 23-37 mins  Bradd Canary, OTR/L Acute Rehab Services Office: [phone]   Lorre Munroe 11/27/2022, 12:16 PM

## 2022-11-27 NOTE — Progress Notes (Signed)
Brief ID Note:   [name] [name] is a 67 y.o. male admitted with hypertensive urgency and found to have possible vegetation on TTE. TEE completed and reveals no concern for vegetation. He has a degree of thickening of aortic valve leaflets w/o stenosis which has been mentioned on previous echocardiograms (Jan 2024). Some aortic dilation noted that will require OP cards monitoring.  Blood cultures negative and remains w/o leukocytosis or with infectious symptoms.  Isolated low grade temp 100.5 11/24 PM but nothing since.   No indication for antibiotics. Can follow up with PCP / Cardiology outpatient.   ID will sign off - please call back with any questions/concerns or if we can be of further assistance.     Rexene Alberts, MSN, NP-C [facility] for Infectious Disease [facility]  [email or web address] Pager: [phone] Office: [phone] [facility] Main Line: [phone] *Secure Chat Communication Welcome

## 2022-11-27 NOTE — TOC Progression Note (Addendum)
Transition of Care [facility]) - Progression Note    Patient Details  Name: [name] [name] MRN: [id number] Date of Birth: 01-05-1955  Transition of [facility]) CM/SW Contact  Michaela Corner, [place] Phone Number: 11/27/2022, 11:59 AM  Clinical Narrative:   CSW left VM for [employer] to start auth for pt.  2:39PM: CSW started [place] with [employer].  4:06PM: CSW called [employer] to inquire about auth status. Per representative, a decision is still pending for auth approval.  TOC will continue to follow.   Expected Discharge Plan: Home w Home Health Services Barriers to Discharge: Continued Medical Work up  Expected Discharge Plan and Services In-house Referral: NA Discharge Planning Services: CM Consult   Living arrangements for the past 2 months: Single Family Home                 DME Arranged: N/A         HH Arranged: RN, PT, Social Work HH Agency: [facility] Date HH Agency Contacted: 11/21/22 Time HH Agency Contacted: 1420 Representative spoke with at [facility] Agency: Haywood Lasso   Social Determinants of Health (SDOH) Interventions SDOH Screenings   Food Insecurity: Food Insecurity Present (11/21/2022)  Housing: Patient Unable To Answer (11/22/2022)  Transportation Needs: Patient Unable To Answer (11/22/2022)  Recent Concern: Transportation Needs - Unmet Transportation Needs (11/21/2022)  Utilities: Patient Unable To Answer (11/22/2022)  Alcohol Screen: Low Risk  (10/23/2019)  Depression (PHQ2-9): High Risk (11/21/2022)  Financial Resource Strain: Low Risk  (11/21/2022)  Physical Activity: Inactive (11/21/2022)  Social Connections: Socially Isolated (11/21/2022)  Stress: Stress Concern Present (11/21/2022)  Tobacco Use: Medium Risk (11/26/2022)  Health Literacy: Adequate Health Literacy (11/21/2022)    Readmission Risk Interventions     No data to display

## 2022-11-27 NOTE — Plan of Care (Signed)
  Problem: Activity: Goal: Risk for activity intolerance will decrease Outcome: Progressing   Problem: Education: Goal: Knowledge of General Education information will improve Description: Including pain rating scale, medication(s)/side effects and non-pharmacologic comfort measures Outcome: Not Progressing   Problem: Education: Goal: Ability to describe self-care measures that may prevent or decrease complications (Diabetes Survival Skills Education) will improve Outcome: Not Progressing   Problem: Coping: Goal: Ability to adjust to condition or change in health will improve Outcome: Not Progressing

## 2022-11-27 NOTE — Progress Notes (Signed)
PROGRESS NOTE    [name] [name]  ZOX:[id number] DOB: 05-23-1955 DOA: 11/20/2022 PCP: Bary Leriche, PA-C    Brief Narrative:  This 67 yrs old Male with PMH significant for diabetes mellitus II, on insulin pump for last 3 years, CAD with history of CABG in 2003, hypertension, peripheral vascular disease, paroxysmal A-fib on Eliquis, with slow ventricular response, hyperlipidemia who  was sent by his PCP to ED for uncontrolled blood pressure, he has not been able to take care of himself at home, reports He had 2 episodes of hypoglycemia where he was found unconscious on the floor over last 2 weeks due to poor oral intake, as well patient has not been compliant with medications. He was found to have significantly high blood pressure in the ED 233/102. Chest x-ray significant for increased  vascular congestion >pulmonary edema, Patient required multiple medications for blood pressure control including p.o. clonidine, losartan, IV hydralazine, blood pressure remains elevated, his BNP was  elevated at 262. He was admitted for hypertensive urgency as well as recurrent hypoglycemic episodes.  Patient needs placement.   Assessment & Plan:   Principal Problem:   Hypertensive emergency Active Problems:   Coronary artery disease   Benign prostatic hyperplasia without lower urinary tract symptoms   PAF (paroxysmal atrial fibrillation) (HCC)   Acute exacerbation of CHF (congestive heart failure) (HCC)   Diabetes mellitus (HCC)   Atrial fibrillation with slow ventricular response (HCC)   Heart valve vegetation  Hypertensive urgency: Patient presented with significantly elevated blood pressure due to non compliance. Continue amlodipine 10 mg daily. Continue hydralazine to 100 mg q8hr No beta-blockers given known slow ventricular response and low heart rate. Avoid clonidine due to withdrawals. Continue losartan and spironolactone. BP much improved.  Acute on chronic diastolic CHF: He  presented with volume overload on imaging, elevated BNP, most likely provoked due to above. Continue with IV Lasix 40 mg IV twice daily. Continue Aldactone 25 mg dialy. Echo 1/24 : LVEF 60 to 65%, normal LV function. Echo 11/24: LVEF 55-60%, Normal LVF Monitor daily weight, intake output charting.  Intake/Output Summary (Last 24 hours) at 11/27/2022 1340 Last data filed at 11/27/2022 1134 Gross per 24 hour  Intake 893.77 ml  Output 450 ml  Net 443.77 ml     Suspected vegetation: Patient was found to have vegetation on Aortic valve. Infectious disease consulted for further recommendation. Hold on Antibiotics for now as there is no leucocytosis,  No fever. Blood cultures no growth so far TEE negative for vegetation.  Diabetes mellitus, type II, uncontrolled with hypoglycemia: Son reports his father had 2 episodes of hypoglycemia over the last couple weeks. Not sure at this point if his insulin pump will be the best option, it has already been discontinued while in ED, will place on insulin sliding scale, and likely will try to put on oral agents of possible, as it would be a safer option in this patient who cannot do his insulin pump or injectable insulin as he lives alone with frequent confusions. Diabetic coordinator consulted.   Delirium : > Resolved. Son reports he has been more forgetful recently. Acute confusion could be in the setting of hypertensive urgency. Patient reports visual hallucinations, trying to get out of bed. Continue Ativan as needed for agitation. Psychiatry consulted for further evaluation. UDS negative   pA.Fib with slow ventricular response; Not on any heart rate control medication given slow ventricular response. Continue Eliquis.   S/P CABG (coronary artery bypass graft) CAD, CABG  in 2003 Continue with aspirin and statin.  Pressure ulcer, POA: Stage 1 on buttock and stage 1 on left heel. Continue with nurse driven pressure ulcer prevention  protocol.   Hypomagnesemia: Replaced.  Continue to monitor.  Severe deconditioning: PT and OT recommended SNF.  Acute kidney injury: Baseline serum creatinine normal, up to 1.34 likely prerenal. Serum creatinine  1.39> 2.21 >1.83 >2.07 Continue IV fluid resuscitation, monitor serum creatinine Avoid nephrotoxic medications, Renal ultrasound > No hydronephrosis.    DVT prophylaxis: Eliquis Code Status: Full code Family Communication:  Spoke with son [name]. Disposition Plan:   Status is: Inpatient Remains inpatient appropriate because:    Patient admitted for hypertensive urgency and recurrent hypoglycemic episodes, PT and OT recommended SNF. Patient not able to take care of himself.  TEE ruled out vegetation.   Consultants:  Cardiology Psychiatry  Procedures: None  Antimicrobials:  Anti-infectives (From admission, onward)    None       Subjective: Patient was seen and examined at bedside.Overnight events noted.   Patient appears very deconditioned, blood pressure has improved. TEE ruled out vegetation.  Patient denies any chest pain.   Objective: Vitals:   11/27/22 0421 11/27/22 0556 11/27/22 0736 11/27/22 1140  BP:  102/61 113/64 (!) 109/56  Pulse:   66 63  Resp:   16 20  Temp:   97.9 F (36.6 C) 97.8 F (36.6 C)  TempSrc:   Oral Oral  SpO2:   96% 100%  Weight: 75.1 kg     Height: 6\' 2"  (1.88 m)       Intake/Output Summary (Last 24 hours) at 11/27/2022 1340 Last data filed at 11/27/2022 1134 Gross per 24 hour  Intake 893.77 ml  Output 450 ml  Net 443.77 ml   Filed Weights   11/25/22 0356 11/26/22 0435 11/27/22 0421  Weight: 74.8 kg 72.2 kg 75.1 kg    Examination:  General exam: Appears comfortable, deconditioned, not in any acute distress. Respiratory system: CTA bilaterally. Respiratory effort normal.  RR 15 Cardiovascular system: S1 & S2 heard, RRR. No JVD, murmurs, rubs, gallops or clicks.  Gastrointestinal system: Abdomen is non  distended, soft and non tender. Normal bowel sounds heard. Central nervous system: Alert and oriented X 2. No focal neurological deficits. Extremities: No edema, No cyanosis, No clubbing  Skin: No rashes, lesions or ulcers Psychiatry: Mood & affect appropriate.     Data Reviewed: I have personally reviewed following labs and imaging studies  CBC: Recent Labs  Lab 11/21/22 0344 11/22/22 0322 11/23/22 0331 11/24/22 0654 11/26/22 0343  WBC 5.1 6.3 7.1 8.9 6.9  NEUTROABS 3.1  --   --   --   --   HGB 10.8* 12.2* 12.2* 10.7* 11.4*  HCT 32.3* 36.8* 37.6* 33.7* 36.7*  MCV 89.5 90.9 91.3 93.1 94.3  PLT 149* 178 194 175 248   Basic Metabolic Panel: Recent Labs  Lab 11/21/22 0344 11/22/22 0322 11/23/22 0331 11/24/22 0654 11/25/22 0256 11/26/22 0343 11/27/22 0301  NA 137  --  137 138 139 135 136  K 3.6  --  4.2 4.7 4.0 4.5 4.3  CL 102  --  101 102 102 96* 101  CO2 27  --  29 26 27 28 29   GLUCOSE 217*  --  103* 164* 128* 204* 161*  BUN 14  --  36* 52* 48* 59* 61*  CREATININE 1.06  --  1.39* 2.21* 1.83* 2.07* 2.04*  CALCIUM 7.9*  --  8.4* 8.3* 8.1* 8.5* 7.9*  MG 1.5* 2.0 2.1 2.0  --  2.2  --   PHOS 4.2  --  4.1 5.0*  --  3.4 3.4   GFR: Estimated Creatinine Clearance: 36.3 mL/min (A) (by C-G formula based on SCr of 2.04 mg/dL (H)). Liver Function Tests: Recent Labs  Lab 11/27/22 0301  ALBUMIN 2.0*   No results for input(s): "LIPASE", "AMYLASE" in the last 168 hours. No results for input(s): "AMMONIA" in the last 168 hours. Coagulation Profile: No results for input(s): "INR", "PROTIME" in the last 168 hours. Cardiac Enzymes: No results for input(s): "CKTOTAL", "CKMB", "CKMBINDEX", "TROPONINI" in the last 168 hours. BNP (last 3 results) No results for input(s): "PROBNP" in the last 8760 hours. HbA1C: No results for input(s): "HGBA1C" in the last 72 hours. CBG: Recent Labs  Lab 11/27/22 0008 11/27/22 0406 11/27/22 0414 11/27/22 0739 11/27/22 1136  GLUCAP 169*  141* 134* 138* 165*   Lipid Profile: No results for input(s): "CHOL", "HDL", "LDLCALC", "TRIG", "CHOLHDL", "LDLDIRECT" in the last 72 hours. Thyroid Function Tests: No results for input(s): "TSH", "T4TOTAL", "FREET4", "T3FREE", "THYROIDAB" in the last 72 hours.  Anemia Panel: No results for input(s): "VITAMINB12", "FOLATE", "FERRITIN", "TIBC", "IRON", "RETICCTPCT" in the last 72 hours.  Sepsis Labs: No results for input(s): "PROCALCITON", "LATICACIDVEN" in the last 168 hours.  Recent Results (from the past 240 hour(s))  Culture, blood (Routine X 2) w Reflex to ID Panel     Status: None   Collection Time: 11/22/22  2:49 PM   Specimen: BLOOD RIGHT ARM  Result Value Ref Range Status   Specimen Description BLOOD RIGHT ARM  Final   Special Requests   Final    BOTTLES DRAWN AEROBIC AND ANAEROBIC Blood Culture results may not be optimal due to an inadequate volume of blood received in culture bottles   Culture   Final    NO GROWTH 5 DAYS Performed at [facility], [address], [place], [place] [address]    Report Status 11/27/2022 FINAL  Final  Culture, blood (Routine X 2) w Reflex to ID Panel     Status: None   Collection Time: 11/22/22  2:51 PM   Specimen: BLOOD RIGHT ARM  Result Value Ref Range Status   Specimen Description BLOOD RIGHT ARM  Final   Special Requests   Final    BOTTLES DRAWN AEROBIC AND ANAEROBIC Blood Culture results may not be optimal due to an inadequate volume of blood received in culture bottles   Culture   Final    NO GROWTH 5 DAYS Performed at [facility], [address], [place], [place] [address]    Report Status 11/27/2022 FINAL  Final     Radiology Studies: ECHO TEE  Result Date: 11/26/2022    TRANSESOPHOGEAL ECHO REPORT   Patient Name:   SAXON BORTH Date of Exam: 11/26/2022 Medical Rec #:  [id number]            Height:       74.0 in Accession #:    [id number]           Weight:       159.2 lb Date of Birth:  1955-05-22              BSA:          1.972 m Patient Age:    67 years             BP:  76/49 mmHg Patient Gender: M                    HR:           61 bpm. Exam Location:  Inpatient Procedure: Transesophageal Echo, Cardiac Doppler and Color Doppler Indications:     Endocarditis  History:         Patient has prior history of Echocardiogram examinations, most                  recent 11/21/2022. CHF, CAD, Prior CABG, Arrythmias:Atrial                  Fibrillation and Bradycardia; Risk Factors:Hypertension,                  Diabetes and Dyslipidemia.  Sonographer:     Lucendia Herrlich RCS Referring Phys:  [id number] Perlie Gold Diagnosing Phys: Epifanio Lesches MD PROCEDURE: After discussion of the risks and benefits of a TEE, an informed consent was obtained from the patient. The transesophogeal probe was passed without difficulty through the esophogus of the patient. Imaged were obtained with the patient in a supine position. Sedation performed by different physician. The patient was monitored while under deep sedation. Anesthestetic sedation was provided intravenously by Anesthesiology: 130mg  of Propofol. The patient developed no complications during the procedure.  IMPRESSIONS  1. Left ventricular ejection fraction, by estimation, is 60 to 65%. The left ventricle has normal function.  2. Right ventricular systolic function is mildly reduced. The right ventricular size is normal.  3. Left atrial size was mildly dilated. No left atrial/left atrial appendage thrombus was detected.  4. The mitral valve is normal in structure. Trivial mitral valve regurgitation.  5. Aortic dilatation noted. There is mild dilatation of the aortic root, measuring 40 mm.  6. The aortic valve is tricuspid. There is moderate thickening of the aortic valve. Aortic valve regurgitation is not visualized. Aortic valve sclerosis/calcification is present, without any evidence of aortic stenosis. Thickened aortic valve leaflets, but no vegetation seen  Conclusion(s)/Recommendation(s): No evidence of vegetation/infective endocarditis on this transesophageael echocardiogram. FINDINGS  Left Ventricle: Left ventricular ejection fraction, by estimation, is 60 to 65%. The left ventricle has normal function. The left ventricular internal cavity size was normal in size. Right Ventricle: The right ventricular size is normal. No increase in right ventricular wall thickness. Right ventricular systolic function is mildly reduced. Left Atrium: Left atrial size was mildly dilated. No left atrial/left atrial appendage thrombus was detected. Right Atrium: Right atrial size was normal in size. Pericardium: There is no evidence of pericardial effusion. Mitral Valve: The mitral valve is normal in structure. Trivial mitral valve regurgitation. Tricuspid Valve: The tricuspid valve is normal in structure. Tricuspid valve regurgitation is trivial. Aortic Valve: The aortic valve is tricuspid. There is moderate thickening of the aortic valve. Aortic valve regurgitation is not visualized. Aortic valve sclerosis/calcification is present, without any evidence of aortic stenosis. Pulmonic Valve: The pulmonic valve was grossly normal. Pulmonic valve regurgitation is trivial. Aorta: Aortic dilatation noted. There is mild dilatation of the aortic root, measuring 40 mm. IAS/Shunts: No atrial level shunt detected by color flow Doppler. Additional Comments: Spectral Doppler performed. LEFT VENTRICLE PLAX 2D LVOT diam:     2.30 cm LVOT Area:     4.15 cm   SHUNTS Systemic Diam: 2.30 cm Epifanio Lesches MD Electronically signed by Epifanio Lesches MD Signature Date/Time: 11/26/2022/1:42:04 PM    Final    EP STUDY  Result Date: 11/26/2022 See surgical note for result.   Scheduled Meds:  amLODipine  10 mg Oral Daily   apixaban  5 mg Oral BID   aspirin EC  81 mg Oral Daily   atorvastatin  40 mg Oral Daily   feeding supplement  237 mL Oral BID BM   hydrALAZINE  100 mg Oral Q8H    insulin aspart  0-9 Units Subcutaneous TID AC & HS   losartan  50 mg Oral Daily   mirtazapine  15 mg Oral QHS   multivitamin with minerals  1 tablet Oral Daily   PARoxetine  20 mg Oral Daily   sodium chloride flush  3 mL Intravenous Q12H   spironolactone  25 mg Oral Daily   thiamine  200 mg Oral Daily   Continuous Infusions:  sodium chloride 50 mL/hr at 11/27/22 0827      LOS: 6 days    Time spent: 35 mins    Willeen Niece, MD [facility]   If 7PM-7AM, please contact night-coverage

## 2022-11-28 DIAGNOSIS — I161 Hypertensive emergency: Secondary | ICD-10-CM | POA: Diagnosis not present

## 2022-11-28 LAB — GLUCOSE, CAPILLARY
Glucose-Capillary: 166 mg/dL — ABNORMAL HIGH (ref 70–99)
Glucose-Capillary: 156 mg/dL — ABNORMAL HIGH (ref 70–99)
Glucose-Capillary: 202 mg/dL — ABNORMAL HIGH (ref 70–99)
Glucose-Capillary: 286 mg/dL — ABNORMAL HIGH (ref 70–99)
Glucose-Capillary: 307 mg/dL — ABNORMAL HIGH (ref 70–99)

## 2022-11-28 LAB — CBC
HCT: 32.3 % — ABNORMAL LOW (ref 39.0–52.0)
Hemoglobin: 10 g/dL — ABNORMAL LOW (ref 13.0–17.0)
MCH: 29.2 pg (ref 26.0–34.0)
MCHC: 31 g/dL (ref 30.0–36.0)
MCV: 94.2 fL (ref 80.0–100.0)
Platelets: 215 10*3/uL (ref 150–400)
RBC: 3.43 MIL/uL — ABNORMAL LOW (ref 4.22–5.81)
RDW: 14.6 % (ref 11.5–15.5)
WBC: 6.4 10*3/uL (ref 4.0–10.5)
nRBC: 0 % (ref 0.0–0.2)

## 2022-11-28 LAB — BASIC METABOLIC PANEL
Anion gap: 8 (ref 5–15)
BUN: 53 mg/dL — ABNORMAL HIGH (ref 8–23)
CO2: 28 mmol/L (ref 22–32)
Calcium: 8.5 mg/dL — ABNORMAL LOW (ref 8.9–10.3)
Chloride: 103 mmol/L (ref 98–111)
Creatinine, Ser: 1.55 mg/dL — ABNORMAL HIGH (ref 0.61–1.24)
GFR, Estimated: 48 mL/min — ABNORMAL LOW (ref >60)
Glucose, Bld: 148 mg/dL — ABNORMAL HIGH (ref 70–99)
Potassium: 4.7 mmol/L (ref 3.5–5.1)
Sodium: 139 mmol/L (ref 135–145)

## 2022-11-28 LAB — PHOSPHORUS: Phosphorus: 3.8 mg/dL (ref 2.5–4.6)

## 2022-11-28 LAB — MAGNESIUM: Magnesium: 2.5 mg/dL — ABNORMAL HIGH (ref 1.7–2.4)

## 2022-11-28 MED ORDER — LORAZEPAM 2 MG/ML IJ SOLN
0.5000 mg | Freq: Once | INTRAMUSCULAR | Status: AC | PRN
  Administered 2022-11-28: 0.5 mg via INTRAVENOUS
  Filled 2022-11-28: qty 1

## 2022-11-28 NOTE — Progress Notes (Signed)
Physical Therapy Treatment Patient Details Name: [name] [name] MRN: [id number] DOB: October 25, 1955 Today's Date: 11/28/2022   History of Present Illness Pt is a 67 y.o. male who presented 11/20/22 as referral from his PCP secondary to uncontrolled blood pressure. PMH: A-fib on Eliquis, CAD s/p CABG, PAD, diastolic CHF, DM2 on insulin pump, HTN, HLD, depression    PT Comments  Pt dozing in bed, wakes easily and is agreeable to walking with therapy. Pt reports both of his sons work here at the hospital and that he is hoping to get a ride home with one of them tonight. Pt min A for bed mobility and transfers. Pt requiring min physical assist and maximal multimodal cuing for navigation with RW in room. As pt walks through threshold of door, he takes a hard R and has loss of balance requiring maxA for steadying. Able to continue ambulation in hallway before returning to room. By end of ambulation pt with increasing fatigue and needs maximal cuing not to sit until he is HoB. D/c plans remain appropriate. PT will continue to follow acutely.    If plan is discharge home, recommend the following: A little help with walking and/or transfers;A little help with bathing/dressing/bathroom;Assistance with cooking/housework;Direct supervision/assist for medications management;Direct supervision/assist for financial management;Assist for transportation;Help with stairs or ramp for entrance   Can travel by private vehicle     Yes  Equipment Recommendations  Rolling walker (2 wheels)       Precautions / Restrictions Precautions Precautions: Fall Restrictions Weight Bearing Restrictions: No     Mobility  Bed Mobility Overal bed mobility: Needs Assistance Bed Mobility: Supine to Sit     Supine to sit: HOB elevated, Used rails, Supervision Sit to supine: Min assist   General bed mobility comments: in chair on entry    Transfers Overall transfer level: Needs assistance Equipment used: Rolling  walker (2 wheels) Transfers: Sit to/from Stand Sit to Stand: Min assist           General transfer comment: min A for power up from elevated bed surface, cues for hand placement, increased effort for self steadying    Ambulation/Gait Ambulation/Gait assistance: Min assist, Max assist Gait Distance (Feet): 30 Feet Assistive device: Rolling walker (2 wheels) Gait Pattern/deviations: Step-through pattern, Decreased step length - right, Decreased step length - left, Decreased stride length Gait velocity: decr Gait velocity interpretation: <1.31 ft/sec, indicative of household ambulator   General Gait Details: pt with very disorganized gait, needs constant multimodal cuing for navigation around obstacles, 1x loss of balance requiring maxA to steady as he was taking a R turn out of his room, once steadied he is able to walk 10 feet in hallway before being cued for very slowed stepping turn to return to room, pt with increased fatigue as he approached bed, requiring increased cuing to get himself to HoB to get into bed       Balance Overall balance assessment: Needs assistance Sitting-balance support: Feet supported, No upper extremity supported Sitting balance-Leahy Scale: Fair   Postural control: Posterior lean Standing balance support: Bilateral upper extremity supported, During functional activity, Reliant on assistive device for balance, No upper extremity supported Standing balance-Leahy Scale: Poor Standing balance comment: reliant on RW and assist from therapist for balance                            Cognition Arousal: Alert Behavior During Therapy: Flat affect Overall Cognitive Status: Impaired/Different from  baseline Area of Impairment: Attention, Memory, Following commands, Awareness, Safety/judgement, Problem solving, Orientation                 Orientation Level: Disoriented to, Situation Current Attention Level: Sustained Memory: Decreased  short-term memory, Decreased recall of precautions Following Commands: Follows one step commands consistently, Follows one step commands inconsistently Safety/Judgement: Decreased awareness of safety, Decreased awareness of deficits Awareness: Emergent Problem Solving: Slow processing, Difficulty sequencing, Requires verbal cues, Requires tactile cues General Comments: continues to have limited awareness of balance deficits, thinks that both of his sons work here at [employer], follows commands 90%           General Comments General comments (skin integrity, edema, etc.): pt reports some transient dizziness, VSS      Pertinent Vitals/Pain Pain Assessment Pain Assessment: Faces Faces Pain Scale: No hurt Pain Location: knees Pain Descriptors / Indicators: Grimacing, Guarding Pain Intervention(s): Monitored during session, Repositioned     PT Goals (current goals can now be found in the care plan section) Acute Rehab PT Goals Patient Stated Goal: to go home PT Goal Formulation: With patient Time For Goal Achievement: 12/05/22 Potential to Achieve Goals: Good Progress towards PT goals: Progressing toward goals    Frequency    Min 1X/week       AM-PAC PT "6 Clicks" Mobility   Outcome Measure  Help needed turning from your back to your side while in a flat bed without using bedrails?: A Little Help needed moving from lying on your back to sitting on the side of a flat bed without using bedrails?: A Little Help needed moving to and from a bed to a chair (including a wheelchair)?: A Little Help needed standing up from a chair using your arms (e.g., wheelchair or bedside chair)?: A Little Help needed to walk in hospital room?: A Little Help needed climbing 3-5 steps with a railing? : A Lot 6 Click Score: 17    End of Session Equipment Utilized During Treatment: Gait belt Activity Tolerance: Patient tolerated treatment well Patient left: in bed;with bed alarm set;with call  bell/phone within reach Nurse Communication: Mobility status PT Visit Diagnosis: Unsteadiness on feet (R26.81);Other abnormalities of gait and mobility (R26.89);Muscle weakness (generalized) (M62.81);Difficulty in walking, not elsewhere classified (R26.2);History of falling (Z91.81);Repeated falls (R29.6)     Time: 1455-1510 PT Time Calculation (min) (ACUTE ONLY): 15 min  Charges:    $Gait Training: 8-22 mins PT General Charges $$ ACUTE PT VISIT: 1 Visit                     [name] [name] B. Beverely Risen PT, DPT Acute Rehabilitation Services Please use secure chat or  Call Office [phone]    Elon Alas [facility] 11/28/2022, 3:31 PM

## 2022-11-28 NOTE — TOC Progression Note (Addendum)
Transition of Care [facility]) - Progression Note    Patient Details  Name: [name] [name] MRN: [id number] Date of Birth: 1955-09-20  Transition of Care [facility]) CM/SW Contact  Michaela Corner, [place] Phone Number: 11/28/2022, 8:46 AM  Clinical Narrative:   CSW left VM for [employer] to check on ins auth - waiting for a call back.  10:16AM: PTAR approved - reference # [id number]  SNF denied but [facility] is offering a peer to peer by 3PM with Dr. Erin Sons [phone]).   CSW notified attending of this information.   1:26PM - Pts son, [name], called CSW to discuss pts discharge plan. CSW informed [name] that insurance has denied SNF but a peer to peer was being called. The results from said peer to peer are that insurance will take another look at pts chart. Ins feels pts will not go home post snf and believes LTC is more suitable.   CSW explained to [name] that pt would need medicaid for LTC but per a previous conversation pt would not qualify as he makes too much money in SSI. CSW provided [name] with [facility] DSS number to contact a case worker to work with on [employer].   [name] stated pt called him and said he does not want to go to SNF. [name] stated his father needs to go and does not believe his dad has capacity to make decisions. [name] asked CSW how to deem pt does not have capacity and what starting the guardianship process would look like. CSW informed [name] that he would need an outpatient provider to deem his father does not have capacity and petition the [place] for guardianship.   4:36PM: Ins auth approved - reference # [id number]. Follow up with pt on if he wants to go to SNF; [facility] can accept Friday.  CSW spoke with pt and he no longer wants to go to SNF. Pt stated " I want to go home but I dont know how." CSW asked pt if he knows where he is and he stated "[facility]."    Expected Discharge Plan: Home w Home Health Services Barriers to Discharge: Continued Medical  Work up  Expected Discharge Plan and Services In-house Referral: NA Discharge Planning Services: CM Consult   Living arrangements for the past 2 months: Single Family Home                 DME Arranged: N/A         HH Arranged: RN, PT, Social Work HH Agency: [facility] Date HH Agency Contacted: 11/21/22 Time HH Agency Contacted: 1420 Representative spoke with at [facility] Agency: Haywood Lasso   Social Determinants of Health (SDOH) Interventions SDOH Screenings   Food Insecurity: Food Insecurity Present (11/21/2022)  Housing: Patient Unable To Answer (11/22/2022)  Transportation Needs: Patient Unable To Answer (11/22/2022)  Recent Concern: Transportation Needs - Unmet Transportation Needs (11/21/2022)  Utilities: Patient Unable To Answer (11/22/2022)  Alcohol Screen: Low Risk  (10/23/2019)  Depression (PHQ2-9): High Risk (11/21/2022)  Financial Resource Strain: Low Risk  (11/21/2022)  Physical Activity: Inactive (11/21/2022)  Social Connections: Socially Isolated (11/21/2022)  Stress: Stress Concern Present (11/21/2022)  Tobacco Use: Medium Risk (11/26/2022)  Health Literacy: Adequate Health Literacy (11/21/2022)    Readmission Risk Interventions     No data to display

## 2022-11-28 NOTE — Progress Notes (Signed)
Nutrition Follow-up  DOCUMENTATION CODES:   Severe malnutrition in context of social or environmental circumstances  INTERVENTION:  Continue regular diet Ensure Enlive po BID, each supplement provides 350 kcal and 20 grams of protein. MVI with minerals daily If po intake remains limited and pt remains inpatient, consider supplemental nutrition support to optimize nutritional status  NUTRITION DIAGNOSIS:   Severe Malnutrition related to social / environmental circumstances as evidenced by severe fat depletion, severe muscle depletion. - remains applicable  GOAL:   Patient will meet greater than or equal to 90% of their needs - goal unmet, addressing via meals and nutrition supplements  MONITOR:   PO intake, Supplement acceptance, Labs, Weight trends, Skin  REASON FOR ASSESSMENT:   Malnutrition Screening Tool    ASSESSMENT:   Pt admitted with hypertensive emergency. PMH significant for T2DM on insulin pump x3 years, CAD with h/o CABG, HTN, PVD, paroxysmal afib, HLD.  11/25 s/p SLP evaluation; full assistance with meals   Pt sleeping soundly at time of visit with hat over his face. Spoke with RN who reports that pt was eating well when he was assigned to the pt previously however he refused nutrition supplements and breakfast meal this morning. Per review of chart, it appears that pt's PO intake has been declining throughout admission. Question whether cognition played a roll in this. Noted improvement in cognition/MS per SLP assessment today.    Will continue with current nutrition interventions and monitor nutritional adequacy. Pt may benefit from supplemental nutrition support if PO intake remains limited and pt remains inpatient. CSW working on discharge plan.   Meal completions: 11/24: 75% breakfast, 75% lunch, 100% dinner 11/25: 50% dinner 11/26: 20% lunch, 0% dinner  Admit weight: 77.3 kg Current weight: 72.3 kg  Medications: SSI 0-9 units QID, MVI,  thiamine  Labs: BUN 53, Cr 1.55, Mg 2.5, GFR 48, CBG's 142-166 x24 hours  Diet Order:   Diet Order             Diet regular Room service appropriate? Yes with Assist; Fluid consistency: Thin  Diet effective now                   EDUCATION NEEDS:   Not appropriate for education at this time  Skin:  Skin Assessment: Skin Integrity Issues: Skin Integrity Issues:: Stage I Stage I: bilateral buttocks  Last BM:  11/25 type 7 medium  Height:   Ht Readings from Last 1 Encounters:  11/27/22 6\' 2"  (1.88 m)    Weight:   Wt Readings from Last 1 Encounters:  11/28/22 72.3 kg   BMI:  Body mass index is 20.46 kg/m.  Estimated Nutritional Needs:   Kcal:  2100-2300  Protein:  105-120g  Fluid:  >/=2L  Drusilla Kanner, RDN, LDN Clinical Nutrition

## 2022-11-28 NOTE — Plan of Care (Signed)
  Problem: Clinical Measurements: Goal: Diagnostic test results will improve Outcome: Progressing   Problem: Activity: Goal: Risk for activity intolerance will decrease Outcome: Progressing   

## 2022-11-28 NOTE — Plan of Care (Signed)
Problem: Elimination: Goal: Will not experience complications related to urinary retention Outcome: Completed/Met

## 2022-11-28 NOTE — Progress Notes (Signed)
Speech Language Pathology Treatment: Dysphagia;Cognitive-Linquistic  Patient Details Name: [name] [name] MRN: [id number] DOB: 07-07-1955 Today's Date: 11/28/2022 Time: 1205-1222 SLP Time Calculation (min) (ACUTE ONLY): 17 min  Assessment / Plan / Recommendation Clinical Impression  Mr. Cantarella demonstrates improved MS today - he was able to follow instructions and sustain attention in order to feed himself a cookie and drink thin liquids.  He demonstrated no further deficits in swallowing. He is managing a regular diet with thin liquids. Dysphagia is resolved.  Cognition is improved - he is able to stay on task and maintain topic of conversation.  He is able to give a history of his past with improved coherence.  He remains inconsistently oriented to elements of place and time and working memory continues to be impaired, requiring visual/verbal cues.  He was able to use Siri independently today to place a call to his son. Overall, cognition is improved. SLP will follow.   HPI HPI: [name] [name] is a 67 y.o. male who presented 11/20/22 as referral from his PCP secondary to uncontrolled blood pressure. CT head 11/19 with no acute findings.  Pt with presumed aspiration event following episode of vomiting overnight 11/22.  PMH: A-fib on Eliquis, CAD s/p CABG, PAD, diastolic CHF, DM2 on insulin pump, HTN, HLD, depression.      SLP Plan  Continue with current plan of care      Recommendations for follow up therapy are one component of a multi-disciplinary discharge planning process, led by the attending physician.  Recommendations may be updated based on patient status, additional functional criteria and insurance authorization.    Recommendations  Diet recommendations: Regular;Thin liquid Liquids provided via: Straw;Cup Medication Administration: Whole meds with liquid Supervision: Intermittent supervision to cue for compensatory strategies                  Oral care BID    Frequent or constant Supervision/Assistance Cognitive communication deficit (R41.841)     Continue with current plan of care    Latosha Gaylord L. Samson Frederic, MA CCC/SLP Clinical Specialist - Acute Care SLP Acute Rehabilitation Services Office number [phone]  [name]  11/28/2022, 12:21 PM

## 2022-11-28 NOTE — Progress Notes (Signed)
TR[facility] night cross cover note:   I was notified by RN that this patient is having trouble sleeping and is experiencing some anxiety, with request for medication to further address this beyond the existing order for as needed hydroxyzine, which is reported to have been ineffective.  I subsequently placed a one-time prn order for 0.5 mg of IV Ativan for this purpose.     Update: Following aforementioned doses of Ativan and hydroxyzine, the patient remains agitated and is noted to be having visual hallucinations.  The patient is pulling at/removing their telemetry leads, and is pulling at his peripheral IV, successfully removing his peripheral IV on 1 occasion, with these behaviors refractory to attempts at verbal redirection.  In the setting of associated interference with ongoing medical treatment posing potential harm to themself, I have placed orders for prn iv haldol as well as soft bilateral wrist restraints.   Newton Pigg, DO Hospitalist

## 2022-11-28 NOTE — Progress Notes (Signed)
Pt was acting more disoriented today, having hallucinations that family members are in his room and that he was at [employer] all day.  Pt was also trying to climb out of bed to meet his son.  MD was notified, will continue to monitor, Thanks Lavonda Jumbo RN.

## 2022-11-28 NOTE — Progress Notes (Signed)
Mobility Specialist Progress Note:   11/28/22 0950  Mobility  Activity Transferred from bed to chair  Level of Assistance Contact guard assist, steadying assist  Assistive Device Front wheel walker  Distance Ambulated (ft) 3 ft  Activity Response Tolerated well  Mobility Referral Yes  $Mobility charge 1 Mobility  Mobility Specialist Start Time (ACUTE ONLY) 0950  Mobility Specialist Stop Time (ACUTE ONLY) 1000  Mobility Specialist Time Calculation (min) (ACUTE ONLY) 10 min   Pt agreeable to mobility session. Displayed incr strength during today's session. Required only minG throughout for transfer. Pt left in chair with all needs met, alarm on.   Addison Lank Mobility Specialist Please contact via SecureChat or  Rehab office at [phone]

## 2022-11-28 NOTE — Progress Notes (Signed)
PROGRESS NOTE    [name] [name]  QAS:[id number] DOB: 05-18-1955 DOA: 11/20/2022 PCP: Bary Leriche, PA-C     Brief Narrative:  [name] [name] is a 67 yrs old male with PMH significant for diabetes mellitus II, on insulin pump for last 3 years, CAD with history of CABG in 2003, hypertension, peripheral vascular disease, paroxysmal A-fib on Eliquis, with slow ventricular response, hyperlipidemia who  was sent by his PCP to ED for uncontrolled blood pressure, he has not been able to take care of himself at home, reports he had 2 episodes of hypoglycemia where he was found unconscious on the floor over last 2 weeks due to poor oral intake, as well patient has not been compliant with medications.  He was found to have significantly high blood pressure in the ED 233/102. Chest x-ray significant for increased  vascular congestion > pulmonary edema, Patient required multiple medications for blood pressure control including p.o. clonidine, losartan, IV hydralazine, blood pressure remains elevated, his BNP was  elevated at 262.  He was admitted for hypertensive urgency as well as recurrent hypoglycemic episodes.   New events last 24 hours / Subjective: Patient with flat affect, has no physical complaints.  Assessment & Plan:   Principal Problem:   Hypertensive emergency Active Problems:   Coronary artery disease   Benign prostatic hyperplasia without lower urinary tract symptoms   PAF (paroxysmal atrial fibrillation) (HCC)   Acute exacerbation of CHF (congestive heart failure) (HCC)   Diabetes mellitus (HCC)   Atrial fibrillation with slow ventricular response (HCC)   Heart valve vegetation   Hypertensive urgency -Insetting of medical noncompliance -Amlodipine, hydralazine, losartan, spironolactone -No beta-blockers due to slow ventricular response -Avoid clonidine due to withdrawals -Blood pressure improved 115/62 this morning  Acute on chronic diastolic CHF -Presented  with volume overload, elevated BNP -Echocardiogram showed EF 55 to 60% -Improved  Suspected aortic vegetation -Infectious disease consulted -TEE negative for vegetation  Diabetes mellitus, uncontrolled with hypoglycemia -Has had couple of episodes of hypoglycemia as outpatient.  Patient poor candidate for insulin pump due to noncompliance -Sliding scale insulin -Would be better to discharge on oral agents  Delirium -Has had some acute delirium in the hospital, visual hallucinations, agitation -Psychiatry was consulted -Resolved  Paroxysmal A-fib -Not on rate control medication due to slow ventricular response -Eliquis  CAD status post CABG -Aspirin, statin  Deconditioning -PT OT recommending SNF placement  AKI -Baseline creatinine 0.9-1 -Patient given IV fluids, improved creatinine  Stage I pressure ulcer, POA -Stage I on buttock, stage I on left heel   DVT prophylaxis:  apixaban (ELIQUIS) tablet 5 mg  Code Status: Full code Family Communication: Discussed with son over the phone today Disposition Plan: SNF placement Status is: Inpatient Remains inpatient appropriate because: SNF placement pending.  High fall risk.  Discussed with Dr. Logan Bores for peer to peer today.  Initial SNF insurance authorization was denied, patient's care felt to be more custodial in nature.  We discussed patient's chronic debilities including mild cognitive decline, ability to care for himself at home, medication noncompliance.  We also discussed his physical deconditioning.  He was able to ambulate 3 feet with assistance and front wheel walker today.  I recommend that patient trial short course of rehab at SNF, with hope of returning back home (but more likely and realistically, patient would need long-term care).   I discussed with son my concern for patient's chronic decline, inability to take care of himself and take medications appropriately.  Patient has capacity to make decisions, per  psychiatry notes and on my assessment today.  We discussed patient's chronic decline and that realistically he may continue to decline further over the next months, years.  I recommended that patient be placed in long-term care facility after his short SNF stay with nursing care to assist with medications as well as continued PT efforts to improve mobility and strength as well as balance.  Patient remains a high fall risk.. This was also relayed to case manager today.   Antimicrobials:  Anti-infectives (From admission, onward)    None        Objective: Vitals:   11/28/22 0427 11/28/22 0614 11/28/22 0823 11/28/22 1004  BP:  115/62 131/66 133/66  Pulse:   61 62  Resp:   18   Temp: 97.9 F (36.6 C)  (!) 97.2 F (36.2 C)   TempSrc: Oral  Oral   SpO2:   96%   Weight:      Height:        Intake/Output Summary (Last 24 hours) at 11/28/2022 1232 Last data filed at 11/28/2022 0436 Gross per 24 hour  Intake 120 ml  Output 875 ml  Net -755 ml   Filed Weights   11/26/22 0435 11/27/22 0421 11/28/22 0426  Weight: 72.2 kg 75.1 kg 72.3 kg    Examination:  General exam: Appears calm and comfortable  Respiratory system: Clear to auscultation. Respiratory effort normal. No respiratory distress. No conversational dyspnea.  Cardiovascular system: S1 & S2 heard, RRR. No murmurs. No pedal edema. Gastrointestinal system: Abdomen is nondistended, soft and nontender. Normal bowel sounds heard. Central nervous system: Alert and oriented. Extremities: Symmetric in appearance  Skin: No rashes, lesions or ulcers on exposed skin  Psychiatry: Flat affect  Data Reviewed: I have personally reviewed following labs and imaging studies  CBC: Recent Labs  Lab 11/22/22 0322 11/23/22 0331 11/24/22 0654 11/26/22 0343 11/28/22 0243  WBC 6.3 7.1 8.9 6.9 6.4  HGB 12.2* 12.2* 10.7* 11.4* 10.0*  HCT 36.8* 37.6* 33.7* 36.7* 32.3*  MCV 90.9 91.3 93.1 94.3 94.2  PLT 178 194 175 248 215   Basic  Metabolic Panel: Recent Labs  Lab 11/22/22 0322 11/23/22 0331 11/23/22 0331 11/24/22 0654 11/25/22 0256 11/26/22 0343 11/27/22 0301 11/28/22 0243  NA  --  137   < > 138 139 135 136 139  K  --  4.2   < > 4.7 4.0 4.5 4.3 4.7  CL  --  101   < > 102 102 96* 101 103  CO2  --  29   < > 26 27 28 29 28   GLUCOSE  --  103*   < > 164* 128* 204* 161* 148*  BUN  --  36*   < > 52* 48* 59* 61* 53*  CREATININE  --  1.39*   < > 2.21* 1.83* 2.07* 2.04* 1.55*  CALCIUM  --  8.4*   < > 8.3* 8.1* 8.5* 7.9* 8.5*  MG 2.0 2.1  --  2.0  --  2.2  --  2.5*  PHOS  --  4.1  --  5.0*  --  3.4 3.4 3.8   < > = values in this interval not displayed.   GFR: Estimated Creatinine Clearance: 46 mL/min (A) (by C-G formula based on SCr of 1.55 mg/dL (H)). Liver Function Tests: Recent Labs  Lab 11/27/22 0301  ALBUMIN 2.0*   No results for input(s): "LIPASE", "AMYLASE" in the last 168 hours. No results  for input(s): "AMMONIA" in the last 168 hours. Coagulation Profile: No results for input(s): "INR", "PROTIME" in the last 168 hours. Cardiac Enzymes: No results for input(s): "CKTOTAL", "CKMB", "CKMBINDEX", "TROPONINI" in the last 168 hours. BNP (last 3 results) No results for input(s): "PROBNP" in the last 8760 hours. HbA1C: No results for input(s): "HGBA1C" in the last 72 hours. CBG: Recent Labs  Lab 11/27/22 1136 11/27/22 1558 11/27/22 2130 11/28/22 0612 11/28/22 1142  GLUCAP 165* 158* 142* 166* 156*   Lipid Profile: No results for input(s): "CHOL", "HDL", "LDLCALC", "TRIG", "CHOLHDL", "LDLDIRECT" in the last 72 hours. Thyroid Function Tests: No results for input(s): "TSH", "T4TOTAL", "FREET4", "T3FREE", "THYROIDAB" in the last 72 hours. Anemia Panel: No results for input(s): "VITAMINB12", "FOLATE", "FERRITIN", "TIBC", "IRON", "RETICCTPCT" in the last 72 hours. Sepsis Labs: No results for input(s): "PROCALCITON", "LATICACIDVEN" in the last 168 hours.  Recent Results (from the past 240 hour(s))   Culture, blood (Routine X 2) w Reflex to ID Panel     Status: None   Collection Time: 11/22/22  2:49 PM   Specimen: BLOOD RIGHT ARM  Result Value Ref Range Status   Specimen Description BLOOD RIGHT ARM  Final   Special Requests   Final    BOTTLES DRAWN AEROBIC AND ANAEROBIC Blood Culture results may not be optimal due to an inadequate volume of blood received in culture bottles   Culture   Final    NO GROWTH 5 DAYS Performed at [facility], [address], [place], [place] [address]    Report Status 11/27/2022 FINAL  Final  Culture, blood (Routine X 2) w Reflex to ID Panel     Status: None   Collection Time: 11/22/22  2:51 PM   Specimen: BLOOD RIGHT ARM  Result Value Ref Range Status   Specimen Description BLOOD RIGHT ARM  Final   Special Requests   Final    BOTTLES DRAWN AEROBIC AND ANAEROBIC Blood Culture results may not be optimal due to an inadequate volume of blood received in culture bottles   Culture   Final    NO GROWTH 5 DAYS Performed at [facility], [address] [place], [place] [address]    Report Status 11/27/2022 FINAL  Final      Radiology Studies: No results found.    Scheduled Meds:  amLODipine  10 mg Oral Daily   apixaban  5 mg Oral BID   aspirin EC  81 mg Oral Daily   atorvastatin  40 mg Oral Daily   feeding supplement  237 mL Oral BID BM   hydrALAZINE  100 mg Oral Q8H   insulin aspart  0-9 Units Subcutaneous TID AC & HS   losartan  50 mg Oral Daily   mirtazapine  15 mg Oral QHS   multivitamin with minerals  1 tablet Oral Daily   PARoxetine  20 mg Oral Daily   sodium chloride flush  3 mL Intravenous Q12H   spironolactone  25 mg Oral Daily   thiamine  200 mg Oral Daily   Continuous Infusions:   LOS: 7 days   Time spent: 50 minutes including discussion with peer to peer physician and son over the phone  Noralee Stain, DO [facility] 11/28/2022, 12:32 PM   Available via Epic secure chat 7am-7pm After these hours,  please refer to coverage provider listed on [email or web address]

## 2022-11-29 ENCOUNTER — Encounter (HOSPITAL_COMMUNITY): Payer: PPO

## 2022-11-29 ENCOUNTER — Inpatient Hospital Stay (HOSPITAL_COMMUNITY): Payer: PPO

## 2022-11-29 DIAGNOSIS — I161 Hypertensive emergency: Secondary | ICD-10-CM | POA: Diagnosis not present

## 2022-11-29 DIAGNOSIS — I639 Cerebral infarction, unspecified: Secondary | ICD-10-CM | POA: Diagnosis not present

## 2022-11-29 LAB — URINALYSIS, ROUTINE W REFLEX MICROSCOPIC
Bilirubin Urine: NEGATIVE
Glucose, UA: 50 mg/dL — AB
Hgb urine dipstick: NEGATIVE
Ketones, ur: 5 mg/dL — AB
Leukocytes,Ua: NEGATIVE
Nitrite: NEGATIVE
Protein, ur: 100 mg/dL — AB
Specific Gravity, Urine: 1.024 (ref 1.005–1.030)
pH: 5 (ref 5.0–8.0)

## 2022-11-29 LAB — BASIC METABOLIC PANEL
Anion gap: 6 (ref 5–15)
BUN: 49 mg/dL — ABNORMAL HIGH (ref 8–23)
CO2: 30 mmol/L (ref 22–32)
Calcium: 8.5 mg/dL — ABNORMAL LOW (ref 8.9–10.3)
Chloride: 100 mmol/L (ref 98–111)
Creatinine, Ser: 1.37 mg/dL — ABNORMAL HIGH (ref 0.61–1.24)
GFR, Estimated: 56 mL/min — ABNORMAL LOW (ref >60)
Glucose, Bld: 226 mg/dL — ABNORMAL HIGH (ref 70–99)
Potassium: 4.5 mmol/L (ref 3.5–5.1)
Sodium: 136 mmol/L (ref 135–145)

## 2022-11-29 LAB — GLUCOSE, CAPILLARY
Glucose-Capillary: 196 mg/dL — ABNORMAL HIGH (ref 70–99)
Glucose-Capillary: 157 mg/dL — ABNORMAL HIGH (ref 70–99)
Glucose-Capillary: 149 mg/dL — ABNORMAL HIGH (ref 70–99)
Glucose-Capillary: 159 mg/dL — ABNORMAL HIGH (ref 70–99)

## 2022-11-29 MED ORDER — IOHEXOL 350 MG/ML SOLN
75.0000 mL | Freq: Once | INTRAVENOUS | Status: AC | PRN
  Administered 2022-11-29: 75 mL via INTRAVENOUS

## 2022-11-29 MED ORDER — HALOPERIDOL LACTATE 5 MG/ML IJ SOLN
5.0000 mg | Freq: Four times a day (QID) | INTRAMUSCULAR | Status: DC | PRN
  Administered 2022-11-29: 5 mg via INTRAVENOUS
  Filled 2022-11-29: qty 1

## 2022-11-29 MED ORDER — QUETIAPINE FUMARATE 25 MG PO TABS
25.0000 mg | ORAL_TABLET | Freq: Every day | ORAL | Status: DC
  Administered 2022-11-29: 25 mg via ORAL
  Filled 2022-11-29: qty 1

## 2022-11-29 MED ORDER — LORAZEPAM 2 MG/ML IJ SOLN: 1.0000 mg | Freq: Once | INTRAMUSCULAR | Status: DC

## 2022-11-29 MED ORDER — HALOPERIDOL LACTATE 5 MG/ML IJ SOLN: 2.0000 mg | Freq: Four times a day (QID) | INTRAMUSCULAR | Status: DC | PRN

## 2022-11-29 MED ORDER — STROKE: EARLY STAGES OF RECOVERY BOOK
Freq: Once | Status: AC
  Filled 2022-11-29: qty 1

## 2022-11-29 NOTE — Consult Note (Signed)
NEUROLOGY CONSULT NOTE   Date of service: November 29, 2022 Patient Name: [name] [name] MRN:  [id number] DOB:  [birth year] Chief Complaint: "High blood pressure" Requesting Provider: Noralee Stain, DO  History of Present Illness  [name] [name] is a 67 y.o. male  has a past medical history of Anxiety, Arthritis, Cataract, CHF (congestive heart failure) (HCC), Constipation, Coronary artery disease, Depression, Diabetes mellitus without complication (HCC), Diabetic retinopathy (HCC), DKA (diabetic ketoacidoses), Hyperlipidemia, Hypertension, Hypertensive retinopathy, Non-intractable vomiting without nausea (06/10/2018), Postherpetic neuralgia (08/11/2021), Psoriasis, and Tubular adenoma of colon (03/2019).   Patient was admitted originally admitted on 11/19 with hypertensive emergency as well as repeated episodes of hypoglycemia and difficulty caring for himself at home.  He later developed altered mental status, and brain MRI was performed which revealed multiple right MCA territory strokes, likely embolic in nature.  Patient does take Eliquis for atrial fibrillation and states that he did miss some doses.   LKW: Unclear Modified rankin score: 2-Slight disability-UNABLE to perform all activities but does not need assistance IV Thrombolysis: No, completed stroke on MRI and on Eliquis EVT: No, completed stroke on MRI  NIHSS components Score: Comment  1a Level of Conscious 0[x]  1[]  2[]  3[]      1b LOC Questions 0[x]  1[]  2[]       1c LOC Commands 0[x]  1[]  2[]       2 Best Gaze 0[x]  1[]  2[]       3 Visual 0[x]  1[]  2[]  3[]      4 Facial Palsy 0[x]  1[]  2[]  3[]      5a Motor Arm - left 0[x]  1[]  2[]  3[]  4[]  UN[]    5b Motor Arm - Right 0[x]  1[]  2[]  3[]  4[]  UN[]    6a Motor Leg - Left 0[x]  1[]  2[]  3[]  4[]  UN[]    6b Motor Leg - Right 0[x]  1[]  2[]  3[]  4[]  UN[]    7 Limb Ataxia 0[x]  1[]  2[]  3[]  UN[]     8 Sensory 0[x]  1[]  2[]  UN[]      9 Best Language 0[x]  1[]  2[]  3[]      10 Dysarthria 0[x]  1[]  2[]   UN[]      11 Extinct. and Inattention 0[x]  1[]  2[]       TOTAL:       ROS  Comprehensive ROS performed and pertinent positives documented in HPI    Past History   Past Medical History:  Diagnosis Date   Anxiety    Arthritis    Cataract    CHF (congestive heart failure) (HCC)    Constipation    pt states goes EOD- hard stools    Coronary artery disease    Depression    Diabetes mellitus without complication (HCC)    Diabetic retinopathy (HCC)    DKA (diabetic ketoacidoses)    Hyperlipidemia    Hypertension    off meds   Hypertensive retinopathy    Non-intractable vomiting without nausea 06/10/2018   Postherpetic neuralgia 08/11/2021   Speculative diagnosis   Psoriasis    Tubular adenoma of colon 03/2019    Past Surgical History:  Procedure Laterality Date   ABDOMINAL AORTOGRAM W/LOWER EXTREMITY N/A 08/30/2020   Procedure: ABDOMINAL AORTOGRAM W/LOWER EXTREMITY;  Surgeon: Nada Libman, MD;  Location: [facility];  Service: Cardiovascular;  Laterality: N/A;   ABDOMINAL AORTOGRAM W/LOWER EXTREMITY N/A 11/15/2020   Procedure: ABDOMINAL AORTOGRAM W/LOWER EXTREMITY;  Surgeon: Nada Libman, MD;  Location: [facility];  Service: Cardiovascular;  Laterality: N/A;   ANKLE SURGERY     right   CARDIAC  CATHETERIZATION  09/25/2001   COLONOSCOPY     ~10 yr ago- normal  per pt    CORONARY ARTERY BYPASS GRAFT  09/30/2001   CABG x 4   IR THORACENTESIS ASP PLEURAL SPACE W/IMG GUIDE  05/19/2020   LAPAROSCOPIC INGUINAL HERNIA REPAIR Bilateral 02/09/2010   MASS EXCISION Left 03/12/2013   Procedure: LEFT INDEX EXCISION MASS AND DEBRIDMENT DISTAL INTERPHALANGEAL JOINT;  Surgeon: Tami Ribas, MD;  Location: [facility];  Service: Orthopedics;  Laterality: Left;   PERIPHERAL VASCULAR BALLOON ANGIOPLASTY Left 08/30/2020   Procedure: PERIPHERAL VASCULAR BALLOON ANGIOPLASTY;  Surgeon: Nada Libman, MD;  Location: [facility];  Service: Cardiovascular;   Laterality: Left;  Peroneal   TRANSESOPHAGEAL ECHOCARDIOGRAM (CATH LAB) N/A 11/26/2022   Procedure: TRANSESOPHAGEAL ECHOCARDIOGRAM;  Surgeon: [name], MD;  Location: [facility];  Service: Cardiovascular;  Laterality: N/A;    Family History: Family History  Problem Relation Age of Onset   Lymphoma Father    Cancer Paternal Grandmother        Colon Cancer   Colon cancer Paternal Grandmother    Colon polyps Neg Hx    Esophageal cancer Neg Hx    Rectal cancer Neg Hx    Stomach cancer Neg Hx    Diabetes Mellitus I Neg Hx    Pancreatic cancer Neg Hx     Social History  reports that he quit smoking about 2 years ago. His smoking use included cigars. He has never used smokeless tobacco. He reports current alcohol use. He reports that he does not use drugs.  Allergies  Allergen Reactions   Wool Alcohol [Lanolin] Hives and Itching    Wool fabric    Medications   Current Facility-Administered Medications:    acetaminophen (TYLENOL) tablet 650 mg, 650 mg, Oral, Q4H PRN, Elgergawy, Leana Roe, MD, 650 mg at 11/29/22 0000   amLODipine (NORVASC) tablet 10 mg, 10 mg, Oral, Daily, Elgergawy, Leana Roe, MD, 10 mg at 11/29/22 0958   apixaban (ELIQUIS) tablet 5 mg, 5 mg, Oral, BID, Idelle Leech, Pardeep, MD, 5 mg at 11/29/22 1610   aspirin EC tablet 81 mg, 81 mg, Oral, Daily, Elgergawy, Leana Roe, MD, 81 mg at 11/29/22 0959   atorvastatin (LIPITOR) tablet 40 mg, 40 mg, Oral, Daily, Elgergawy, Leana Roe, MD, 40 mg at 11/29/22 0959   feeding supplement (ENSURE ENLIVE / ENSURE PLUS) liquid 237 mL, 237 mL, Oral, BID BM, Khatri, Pardeep, MD, 237 mL at 11/28/22 1152   haloperidol lactate (HALDOL) injection 2 mg, 2 mg, Intravenous, Q6H PRN, Noralee Stain, DO   hydrALAZINE (APRESOLINE) injection 10 mg, 10 mg, Intravenous, Q4H PRN, Elgergawy, Leana Roe, MD   hydrALAZINE (APRESOLINE) tablet 100 mg, 100 mg, Oral, Q8H, Khatri, Pardeep, MD, 100 mg at 11/29/22 9604   hydrOXYzine (ATARAX) tablet  25 mg, 25 mg, Oral, TID PRN, Maryagnes Amos, FNP, 25 mg at 11/28/22 2316   insulin aspart (novoLOG) injection 0-9 Units, 0-9 Units, Subcutaneous, TID AC & HS, Idelle Leech, Pardeep, MD, 2 Units at 11/29/22 1148   losartan (COZAAR) tablet 50 mg, 50 mg, Oral, Daily, Elgergawy, Leana Roe, MD, 50 mg at 11/29/22 0959   mirtazapine (REMERON) tablet 15 mg, 15 mg, Oral, QHS, Elgergawy, Leana Roe, MD, 15 mg at 11/28/22 2133   multivitamin with minerals tablet 1 tablet, 1 tablet, Oral, Daily, Idelle Leech, Pardeep, MD, 1 tablet at 11/29/22 0958   ondansetron (ZOFRAN) injection 4 mg, 4 mg, Intravenous, Q6H PRN, Elgergawy, Leana Roe, MD, 4  mg at 11/25/22 2208   PARoxetine (PAXIL) tablet 20 mg, 20 mg, Oral, Daily, Elgergawy, Leana Roe, MD, 20 mg at 11/29/22 1610   QUEtiapine (SEROQUEL) tablet 25 mg, 25 mg, Oral, QHS, Choi, Jennifer, DO   sodium chloride flush (NS) 0.9 % injection 3 mL, 3 mL, Intravenous, Q12H, Elgergawy, Leana Roe, MD, 3 mL at 11/29/22 0959   sodium chloride flush (NS) 0.9 % injection 3 mL, 3 mL, Intravenous, PRN, Elgergawy, Leana Roe, MD   spironolactone (ALDACTONE) tablet 25 mg, 25 mg, Oral, Daily, Elgergawy, Leana Roe, MD, 25 mg at 11/29/22 0958   thiamine (VITAMIN B1) tablet 200 mg, 200 mg, Oral, Daily, Elgergawy, Leana Roe, MD, 200 mg at 11/29/22 0958  Vitals   Vitals:   11/28/22 2315 11/29/22 0300 11/29/22 0707 11/29/22 0829  BP: (!) 143/67  133/73 (!) 88/57  Pulse: (!) 59   60  Resp: 17   14  Temp: 98 F (36.7 C)   98 F (36.7 C)  TempSrc: Axillary   Axillary  SpO2: 92%     Weight:  73.7 kg    Height:        Body mass index is 20.86 kg/m.  Physical Exam   Constitutional: Ill-appearing, elderly patient in no acute distress Psych: Affect appropriate to situation.  Eyes: No scleral injection.  HENT: No OP obstruction.  Head: Normocephalic.  Respiratory: Effort normal, non-labored breathing.  Skin: WDI.   Neurologic Examination    NEURO:  Mental Status: Alert and oriented to  person place and time, able to state that he was admitted to the hospital due to hypertension.  Able to follow all simple commands but has trouble with two-step commands Speech/Language: speech is without dysarthria or aphasia.   Cranial Nerves:  II: PERRL.  Poor visual acuity III, IV, VI: EOMI. Eyelids elevate symmetrically.  V: Sensation is intact to light touch and symmetrical to face.  VII: Smile is symmetrical.  VIII: hearing intact to voice. IX, X: Phonation is normal.  RU:[id number] shrug 5/5. XII: tongue is midline without fasciculations. Motor: 5/5 strength to all muscle groups tested.  Tone: is normal and bulk is normal Sensation- Intact to light touch bilaterally.  Coordination: FTN intact bilaterally.No drift.  Gait- deferred     Labs/Imaging/Neurodiagnostic studies   CBC:  Recent Labs  Lab 12-03-22 0343 11/28/22 0243  WBC 6.9 6.4  HGB 11.4* 10.0*  HCT 36.7* 32.3*  MCV 94.3 94.2  PLT 248 215   Basic Metabolic Panel:  Lab Results  Component Value Date   NA 136 11/29/2022   K 4.5 11/29/2022   CO2 30 11/29/2022   GLUCOSE 226 (H) 11/29/2022   BUN 49 (H) 11/29/2022   CREATININE 1.37 (H) 11/29/2022   CALCIUM 8.5 (L) 11/29/2022   GFRNONAA 56 (L) 11/29/2022   GFRAA >60 06/09/2018   Lipid Panel:  Lab Results  Component Value Date   LDLCALC 34 02/16/2021   HgbA1c:  Lab Results  Component Value Date   HGBA1C 9.8 (A) 11/13/2022   Urine Drug Screen:     Component Value Date/Time   LABOPIA NONE DETECTED 11/23/2022 1336   COCAINSCRNUR NONE DETECTED 11/23/2022 1336   LABBENZ NONE DETECTED 11/23/2022 1336   AMPHETMU NONE DETECTED 11/23/2022 1336   THCU NONE DETECTED 11/23/2022 1336   LABBARB NONE DETECTED 11/23/2022 1336    Alcohol Level     Component Value Date/Time   ETH <10 01/03/2022 1236   INR No results found for: "INR" APTT No results found  for: "APTT" AED levels: No results found for: "PHENYTOIN", "ZONISAMIDE", "LAMOTRIGINE",  "LEVETIRACETA"  CT Head without contrast(Personally reviewed): No acute abnormality  CT angio Head and Neck with contrast(Personally reviewed): No LVO or hemodynamically significant stenosis  MRI Brain(Personally reviewed): Multiple small, possibly embolic infarcts in the right MCA territory   ASSESSMENT   Alistair Rahat Voorhees is a 67 y.o. male  has a past medical history of Anxiety, Arthritis, Cataract, CHF (congestive heart failure) (HCC), Constipation, Coronary artery disease, Depression, Diabetes mellitus without complication (HCC), Diabetic retinopathy (HCC), DKA (diabetic ketoacidoses), Hyperlipidemia, Hypertension, Hypertensive retinopathy, Non-intractable vomiting without nausea (06/10/2018), Postherpetic neuralgia (08/11/2021), Psoriasis, and Tubular adenoma of colon (03/2019).  He was originally admitted to the hospital for hypertensive emergency and then developed altered mental status.  MRI brain revealed several small, punctate infarcts in the right MCA territory.  The patient has atrial fibrillation and does state that he missed doses of Eliquis, infarcts are likely embolic in nature.  Exam is nonfocal with patient having some difficulty with multistep commands but able to state why he is in the hospital.  Will commence full stroke workup.  He does have several uncontrolled risk factors, including uncontrolled hypertension and A1c at 9.8.  RECOMMENDATIONS  Stroke/TIA Workup  -Likely outside of permissive hypertension window, maintain normotension - TTE w/ bubble - Check A1c and LDL + add statin per guidelines -Continue Eliquis and aspirin - q4 hr neuro checks - STAT head CT for any change in neuro exam - Tele - PT/OT/SLP - Stroke education - Amb referral to neurology upon discharge   ______________________________________________________________________  Patient seen by NP and then by MD, MD to edit note as needed.  Signed, Cortney E Ernestina Columbia, NP [facility]   NEUROHOSPITALIST ADDENDUM Performed a face to face diagnostic evaluation.   I have reviewed the contents of history and physical exam as documented by PA/ARNP/Resident and agree with above documentation.  I have discussed and formulated the above plan as documented. Edits to the note have been made as needed.  Erick Blinks, MD [facility] [phone]   If 7pm to 7am, please call on call as listed on AMION.

## 2022-11-29 NOTE — Progress Notes (Signed)
Mobility Specialist Progress Note:    11/29/22 1203  Mobility  Activity  (ROM)  Level of Assistance Minimal assist, patient does 75% or more  Assistive Device Other (Comment) (HHA)  Activity Response Tolerated fair  Mobility Referral Yes  $Mobility charge 1 Mobility  Mobility Specialist Start Time (ACUTE ONLY) 1029  Mobility Specialist Stop Time (ACUTE ONLY) 1039  Mobility Specialist Time Calculation (min) (ACUTE ONLY) 10 min   Pt received in bed very lethargic agreeable to some ROM exercises. Was able to perform BLE exercises w/o fault. No c/o throughout. Call bell and personal belongings in reach. All needs met.  Thompson Grayer Mobility Specialist  Please contact vis Secure Chat or  Rehab Office [phone]

## 2022-11-29 NOTE — Progress Notes (Addendum)
PROGRESS NOTE    [name] [name]  DGL:[id number] DOB: Sep 10, 1955 DOA: 11/20/2022 PCP: Bary Leriche, PA-C     Brief Narrative:  [name] [name] is a 67 yrs old male with PMH significant for diabetes mellitus II, on insulin pump for last 3 years, CAD with history of CABG in 2003, hypertension, peripheral vascular disease, paroxysmal A-fib on Eliquis, with slow ventricular response, hyperlipidemia who  was sent by his PCP to ED for uncontrolled blood pressure, he has not been able to take care of himself at home, reports he had 2 episodes of hypoglycemia where he was found unconscious on the floor over last 2 weeks due to poor oral intake, as well patient has not been compliant with medications.  He was found to have significantly high blood pressure in the ED 233/102. Chest x-ray significant for increased  vascular congestion > pulmonary edema, Patient required multiple medications for blood pressure control including p.o. clonidine, losartan, IV hydralazine, blood pressure remains elevated, his BNP was  elevated at 262.  He was admitted for hypertensive urgency as well as recurrent hypoglycemic episodes.   New events last 24 hours / Subjective: Has been having episodes of auditory and visual hallucinations since yesterday evening.  He had episode of hallucination earlier this hospitalization.  On chart review, also had hallucination back in January 2024, was found to have UTI at that time.  Discussed with son today over the phone.  Patient received a dose of Ativan as well as Haldol.  Required soft wrist restraints for patient's safety.  On examination this morning, patient is difficult to arouse, moving all extremities spontaneously.  Assessment & Plan:   Principal Problem:   Hypertensive emergency Active Problems:   Coronary artery disease   Benign prostatic hyperplasia without lower urinary tract symptoms   PAF (paroxysmal atrial fibrillation) (HCC)   Acute exacerbation  of CHF (congestive heart failure) (HCC)   Diabetes mellitus (HCC)   Atrial fibrillation with slow ventricular response (HCC)   Heart valve vegetation   Hypertensive urgency -Insetting of medical noncompliance -Amlodipine, hydralazine, losartan, spironolactone -No beta-blockers due to slow ventricular response -Avoid clonidine due to withdrawals -Blood pressure improved   Acute on chronic diastolic CHF -Presented with volume overload, elevated BNP -Echocardiogram showed EF 55 to 60% -Improved  Suspected aortic vegetation -Infectious disease consulted -TEE negative for vegetation  Diabetes mellitus, uncontrolled with hypoglycemia -Has had couple of episodes of hypoglycemia as outpatient.  Patient poor candidate for insulin pump due to noncompliance -Sliding scale insulin -Would be better to discharge on oral agents  Delirium -Has had some acute delirium in the hospital, visual hallucinations, agitation -Psychiatry was consulted -Question metabolic versus underlying dementia with sundowning.  Check UA, MRI brain.  Also obtain MMSE once patient more alert.  Addendum -MRI showing watershed infarct.  Discussed with Dr. Derry Lory (neurology) for consult and workup.  He reviewed imaging, did not feel this stroke would explain patient's delirium and hallucinations however.  Paroxysmal A-fib -Not on rate control medication due to slow ventricular response -Eliquis  CAD status post CABG -Aspirin, statin  Deconditioning -PT OT recommending SNF placement  AKI -Baseline creatinine 0.9-1 -Patient given IV fluids, improved creatinine  Stage I pressure ulcer, POA -Stage I on buttock, stage I on left heel   DVT prophylaxis:  apixaban (ELIQUIS) tablet 5 mg  Code Status: Full code Family Communication: Discussed with son over the phone today Disposition Plan: SNF placement Status is: Inpatient Remains inpatient appropriate because: SNF placement pending.  High fall  risk.   Antimicrobials:  Anti-infectives (From admission, onward)    None        Objective: Vitals:   11/28/22 2315 11/29/22 0300 11/29/22 0707 11/29/22 0829  BP: (!) 143/67  133/73 (!) 88/57  Pulse: (!) 59   60  Resp: 17   14  Temp: 98 F (36.7 C)   98 F (36.7 C)  TempSrc: Axillary   Axillary  SpO2: 92%     Weight:  73.7 kg    Height:        Intake/Output Summary (Last 24 hours) at 11/29/2022 1122 Last data filed at 11/29/2022 0100 Gross per 24 hour  Intake 200 ml  Output 900 ml  Net -700 ml   Filed Weights   11/27/22 0421 11/28/22 0426 11/29/22 0300  Weight: 75.1 kg 72.3 kg 73.7 kg    Examination:  General exam: Appears somnolent Respiratory system: Clear to auscultation. Respiratory effort normal. No respiratory distress. No conversational dyspnea.  Cardiovascular system: S1 & S2 heard, bradycardic rate. No murmurs. No pedal edema. Gastrointestinal system: Abdomen is nondistended, soft Extremities: Symmetric in appearance  Skin: No rashes, lesions or ulcers on exposed skin   Data Reviewed: I have personally reviewed following labs and imaging studies  CBC: Recent Labs  Lab 11/23/22 0331 11/24/22 0654 11/26/22 0343 11/28/22 0243  WBC 7.1 8.9 6.9 6.4  HGB 12.2* 10.7* 11.4* 10.0*  HCT 37.6* 33.7* 36.7* 32.3*  MCV 91.3 93.1 94.3 94.2  PLT 194 175 248 215   Basic Metabolic Panel: Recent Labs  Lab 11/23/22 0331 11/24/22 0654 11/25/22 0256 11/26/22 0343 11/27/22 0301 11/28/22 0243 11/29/22 0231  NA 137 138 139 135 136 139 136  K 4.2 4.7 4.0 4.5 4.3 4.7 4.5  CL 101 102 102 96* 101 103 100  CO2 29 26 27 28 29 28 30   GLUCOSE 103* 164* 128* 204* 161* 148* 226*  BUN 36* 52* 48* 59* 61* 53* 49*  CREATININE 1.39* 2.21* 1.83* 2.07* 2.04* 1.55* 1.37*  CALCIUM 8.4* 8.3* 8.1* 8.5* 7.9* 8.5* 8.5*  MG 2.1 2.0  --  2.2  --  2.5*  --   PHOS 4.1 5.0*  --  3.4 3.4 3.8  --    GFR: Estimated Creatinine Clearance: 53 mL/min (A) (by C-G formula based on SCr  of 1.37 mg/dL (H)). Liver Function Tests: Recent Labs  Lab 11/27/22 0301  ALBUMIN 2.0*   No results for input(s): "LIPASE", "AMYLASE" in the last 168 hours. No results for input(s): "AMMONIA" in the last 168 hours. Coagulation Profile: No results for input(s): "INR", "PROTIME" in the last 168 hours. Cardiac Enzymes: No results for input(s): "CKTOTAL", "CKMB", "CKMBINDEX", "TROPONINI" in the last 168 hours. BNP (last 3 results) No results for input(s): "PROBNP" in the last 8760 hours. HbA1C: No results for input(s): "HGBA1C" in the last 72 hours. CBG: Recent Labs  Lab 11/28/22 1142 11/28/22 1658 11/28/22 2114 11/28/22 2128 11/29/22 0701  GLUCAP 156* 202* 307* 286* 196*   Lipid Profile: No results for input(s): "CHOL", "HDL", "LDLCALC", "TRIG", "CHOLHDL", "LDLDIRECT" in the last 72 hours. Thyroid Function Tests: No results for input(s): "TSH", "T4TOTAL", "FREET4", "T3FREE", "THYROIDAB" in the last 72 hours. Anemia Panel: No results for input(s): "VITAMINB12", "FOLATE", "FERRITIN", "TIBC", "IRON", "RETICCTPCT" in the last 72 hours. Sepsis Labs: No results for input(s): "PROCALCITON", "LATICACIDVEN" in the last 168 hours.  Recent Results (from the past 240 hour(s))  Culture, blood (Routine X 2) w Reflex to ID Panel  Status: None   Collection Time: 11/22/22  2:49 PM   Specimen: BLOOD RIGHT ARM  Result Value Ref Range Status   Specimen Description BLOOD RIGHT ARM  Final   Special Requests   Final    BOTTLES DRAWN AEROBIC AND ANAEROBIC Blood Culture results may not be optimal due to an inadequate volume of blood received in culture bottles   Culture   Final    NO GROWTH 5 DAYS Performed at [facility], [address] [place], [place] [address]    Report Status 11/27/2022 FINAL  Final  Culture, blood (Routine X 2) w Reflex to ID Panel     Status: None   Collection Time: 11/22/22  2:51 PM   Specimen: BLOOD RIGHT ARM  Result Value Ref Range Status   Specimen  Description BLOOD RIGHT ARM  Final   Special Requests   Final    BOTTLES DRAWN AEROBIC AND ANAEROBIC Blood Culture results may not be optimal due to an inadequate volume of blood received in culture bottles   Culture   Final    NO GROWTH 5 DAYS Performed at [facility], [address] [place], [place] [address]    Report Status 11/27/2022 FINAL  Final      Radiology Studies: No results found.    Scheduled Meds:  amLODipine  10 mg Oral Daily   apixaban  5 mg Oral BID   aspirin EC  81 mg Oral Daily   atorvastatin  40 mg Oral Daily   feeding supplement  237 mL Oral BID BM   hydrALAZINE  100 mg Oral Q8H   insulin aspart  0-9 Units Subcutaneous TID AC & HS   losartan  50 mg Oral Daily   mirtazapine  15 mg Oral QHS   multivitamin with minerals  1 tablet Oral Daily   PARoxetine  20 mg Oral Daily   sodium chloride flush  3 mL Intravenous Q12H   spironolactone  25 mg Oral Daily   thiamine  200 mg Oral Daily   Continuous Infusions:   LOS: 8 days   Time spent: 35 min   Noralee Stain, DO [facility] 11/29/2022, 11:22 AM   Available via Epic secure chat 7am-7pm After these hours, please refer to coverage provider listed on [email or web address]

## 2022-11-29 NOTE — Plan of Care (Signed)
  Problem: Education: Goal: Knowledge of General Education information will improve Description: Including pain rating scale, medication(s)/side effects and non-pharmacologic comfort measures Outcome: Not Progressing   Problem: Health Behavior/Discharge Planning: Goal: Ability to manage health-related needs will improve Outcome: Not Progressing   Problem: Nutrition: Goal: Adequate nutrition will be maintained Outcome: Not Progressing   Problem: Coping: Goal: Level of anxiety will decrease Outcome: Not Progressing   Problem: Education: Goal: Ability to describe self-care measures that may prevent or decrease complications (Diabetes Survival Skills Education) will improve Outcome: Not Progressing   Problem: Coping: Goal: Ability to adjust to condition or change in health will improve Outcome: Not Progressing   Problem: Nutritional: Goal: Maintenance of adequate nutrition will improve Outcome: Not Progressing   Problem: Safety: Goal: Non-violent Restraint(s) Outcome: Not Progressing

## 2022-11-29 NOTE — TOC Progression Note (Signed)
Transition of Care [facility]) - Progression Note    Patient Details  Name: [name] [name] MRN: [id number] Date of Birth: 1955/01/10  Transition [facility]) CM/SW Contact  Carmina Miller, LCSWA Phone Number: 11/29/2022, 11:48 AM  Clinical Narrative:     CSW met with pt at bedside, pt agreeable to SNF when medically ready for dc.   Expected Discharge Plan: Home w Home Health Services Barriers to Discharge: Continued Medical Work up  Expected Discharge Plan and Services In-house Referral: NA Discharge Planning Services: CM Consult   Living arrangements for the past 2 months: Single Family Home                 DME Arranged: N/A         HH Arranged: RN, PT, Social Work HH Agency: [facility] Date HH Agency Contacted: 11/21/22 Time HH Agency Contacted: 1420 Representative spoke with at [facility] Agency: Haywood Lasso   Social Determinants of Health (SDOH) Interventions SDOH Screenings   Food Insecurity: Food Insecurity Present (11/21/2022)  Housing: Patient Unable To Answer (11/22/2022)  Transportation Needs: Patient Unable To Answer (11/22/2022)  Recent Concern: Transportation Needs - Unmet Transportation Needs (11/21/2022)  Utilities: Patient Unable To Answer (11/22/2022)  Alcohol Screen: Low Risk  (10/23/2019)  Depression (PHQ2-9): High Risk (11/21/2022)  Financial Resource Strain: Low Risk  (11/21/2022)  Physical Activity: Inactive (11/21/2022)  Social Connections: Socially Isolated (11/21/2022)  Stress: Stress Concern Present (11/21/2022)  Tobacco Use: Medium Risk (11/26/2022)  Health Literacy: Adequate Health Literacy (11/21/2022)    Readmission Risk Interventions     No data to display

## 2022-11-29 NOTE — Progress Notes (Signed)
Pts hallucinations have increased from visual to auditory. States his kids are in the room and has been talking to them all night. Prn medications administered without improvement. Pt also removed his PIV. Unable to redirect to current location. Also observed pt stretching both arms into the air and reaching/grasping the air as if holding an item. Will cont to reorient/redirect as needed. Safety precautions in place. Bed alarm set. Bed in lowest position.

## 2022-11-29 NOTE — Plan of Care (Signed)
Problem: Nutrition: Goal: Adequate nutrition will be maintained Outcome: Progressing   Problem: Elimination: Goal: Will not experience complications related to bowel motility Outcome: Progressing

## 2022-11-30 DIAGNOSIS — I69319 Unspecified symptoms and signs involving cognitive functions following cerebral infarction: Secondary | ICD-10-CM | POA: Diagnosis not present

## 2022-11-30 DIAGNOSIS — E1165 Type 2 diabetes mellitus with hyperglycemia: Secondary | ICD-10-CM | POA: Diagnosis not present

## 2022-11-30 DIAGNOSIS — E119 Type 2 diabetes mellitus without complications: Secondary | ICD-10-CM | POA: Diagnosis not present

## 2022-11-30 DIAGNOSIS — R278 Other lack of coordination: Secondary | ICD-10-CM | POA: Diagnosis not present

## 2022-11-30 DIAGNOSIS — R5381 Other malaise: Secondary | ICD-10-CM | POA: Diagnosis not present

## 2022-11-30 DIAGNOSIS — I639 Cerebral infarction, unspecified: Secondary | ICD-10-CM | POA: Insufficient documentation

## 2022-11-30 DIAGNOSIS — I4891 Unspecified atrial fibrillation: Secondary | ICD-10-CM | POA: Diagnosis not present

## 2022-11-30 DIAGNOSIS — E1142 Type 2 diabetes mellitus with diabetic polyneuropathy: Secondary | ICD-10-CM

## 2022-11-30 DIAGNOSIS — Z794 Long term (current) use of insulin: Secondary | ICD-10-CM | POA: Diagnosis not present

## 2022-11-30 DIAGNOSIS — Z8673 Personal history of transient ischemic attack (TIA), and cerebral infarction without residual deficits: Secondary | ICD-10-CM | POA: Insufficient documentation

## 2022-11-30 DIAGNOSIS — L22 Diaper dermatitis: Secondary | ICD-10-CM | POA: Diagnosis not present

## 2022-11-30 DIAGNOSIS — I10 Essential (primary) hypertension: Secondary | ICD-10-CM | POA: Diagnosis not present

## 2022-11-30 DIAGNOSIS — I5033 Acute on chronic diastolic (congestive) heart failure: Secondary | ICD-10-CM | POA: Diagnosis not present

## 2022-11-30 DIAGNOSIS — I5030 Unspecified diastolic (congestive) heart failure: Secondary | ICD-10-CM | POA: Diagnosis not present

## 2022-11-30 DIAGNOSIS — I161 Hypertensive emergency: Secondary | ICD-10-CM | POA: Diagnosis not present

## 2022-11-30 DIAGNOSIS — I480 Paroxysmal atrial fibrillation: Secondary | ICD-10-CM | POA: Diagnosis not present

## 2022-11-30 DIAGNOSIS — R531 Weakness: Secondary | ICD-10-CM | POA: Diagnosis not present

## 2022-11-30 DIAGNOSIS — M6281 Muscle weakness (generalized): Secondary | ICD-10-CM | POA: Diagnosis not present

## 2022-11-30 DIAGNOSIS — R41841 Cognitive communication deficit: Secondary | ICD-10-CM | POA: Diagnosis not present

## 2022-11-30 DIAGNOSIS — Z7401 Bed confinement status: Secondary | ICD-10-CM | POA: Diagnosis not present

## 2022-11-30 LAB — LIPID PANEL
Cholesterol: 117 mg/dL (ref 0–200)
HDL: 48 mg/dL (ref >40)
LDL Cholesterol: 52 mg/dL (ref 0–99)
Total CHOL/HDL Ratio: 2.4 {ratio}
Triglycerides: 84 mg/dL (ref <150)
VLDL: 17 mg/dL (ref 0–40)

## 2022-11-30 LAB — BASIC METABOLIC PANEL
Anion gap: 12 (ref 5–15)
BUN: 44 mg/dL — ABNORMAL HIGH (ref 8–23)
CO2: 25 mmol/L (ref 22–32)
Calcium: 8.6 mg/dL — ABNORMAL LOW (ref 8.9–10.3)
Chloride: 102 mmol/L (ref 98–111)
Creatinine, Ser: 1.5 mg/dL — ABNORMAL HIGH (ref 0.61–1.24)
GFR, Estimated: 50 mL/min — ABNORMAL LOW (ref >60)
Glucose, Bld: 182 mg/dL — ABNORMAL HIGH (ref 70–99)
Potassium: 5.4 mmol/L — ABNORMAL HIGH (ref 3.5–5.1)
Sodium: 139 mmol/L (ref 135–145)

## 2022-11-30 LAB — CBC
HCT: 32.6 % — ABNORMAL LOW (ref 39.0–52.0)
Hemoglobin: 10.3 g/dL — ABNORMAL LOW (ref 13.0–17.0)
MCH: 29.3 pg (ref 26.0–34.0)
MCHC: 31.6 g/dL (ref 30.0–36.0)
MCV: 92.6 fL (ref 80.0–100.0)
Platelets: 236 10*3/uL (ref 150–400)
RBC: 3.52 MIL/uL — ABNORMAL LOW (ref 4.22–5.81)
RDW: 14.3 % (ref 11.5–15.5)
WBC: 6.4 10*3/uL (ref 4.0–10.5)
nRBC: 0 % (ref 0.0–0.2)

## 2022-11-30 LAB — GLUCOSE, CAPILLARY
Glucose-Capillary: 163 mg/dL — ABNORMAL HIGH (ref 70–99)
Glucose-Capillary: 293 mg/dL — ABNORMAL HIGH (ref 70–99)
Glucose-Capillary: 270 mg/dL — ABNORMAL HIGH (ref 70–99)

## 2022-11-30 LAB — HEMOGLOBIN A1C
Hgb A1c MFr Bld: 9.2 % — ABNORMAL HIGH (ref 4.8–5.6)
Mean Plasma Glucose: 217.34 mg/dL

## 2022-11-30 LAB — POTASSIUM: Potassium: 4.9 mmol/L (ref 3.5–5.1)

## 2022-11-30 MED ORDER — QUETIAPINE FUMARATE 25 MG PO TABS: 25.0000 mg | ORAL_TABLET | Freq: Every day | ORAL | 0 refills | Status: DC

## 2022-11-30 MED ORDER — LINAGLIPTIN 5 MG PO TABS: 5.0000 mg | ORAL_TABLET | Freq: Every day | ORAL | 0 refills | Status: DC

## 2022-11-30 MED ORDER — HYDROXYZINE HCL 25 MG PO TABS: 25.0000 mg | ORAL_TABLET | Freq: Three times a day (TID) | ORAL | 0 refills | Status: DC | PRN

## 2022-11-30 NOTE — TOC Transition Note (Signed)
Transition of Care [facility]) - CM/SW Discharge Note   Patient Details  Name: [name] [name] MRN: [id number] Date of Birth: 02/23/1955  Transition of Care [facility]) CM/SW Contact:  Michaela Corner, LCSWA Phone Number: 11/30/2022, 2:17 PM   Clinical Narrative:   Patient will DC to: [facility] Anticipated DC date: 11/30/2022 Family notified: [name] (son) Transport by: Sharin Mons   Per MD patient ready for DC to [facility] -SNF. RN to call report prior to discharge [phone] ext [phone]; room 3221). RN, patient, patient's family, and facility notified of DC. Discharge Summary and FL2 sent to facility. DC packet on chart. Ambulance transport requested for patient.   CSW will sign off for now as social work intervention is no longer needed. Please consult [place] again if new needs arise.    Final next level of care: Skilled Nursing Facility Barriers to Discharge: Barriers Resolved   Patient Goals and CMS Choice      Discharge Placement                Patient chooses bed at: [facility] Patient to be transferred to facility by: [facility] Name of family member notified: [name] - son Patient and family notified of of transfer: 11/30/22  Discharge Plan and Services Additional resources added to the After Visit Summary for   In-house Referral: NA Discharge Planning Services: CM Consult            DME Arranged: N/A         HH Arranged: RN, PT, Social Work [employer] Agency: [facility] Date HH Agency Contacted: 11/21/22 Time HH Agency Contacted: 1420 Representative spoke with at [facility] Agency: Haywood Lasso  Social Determinants of Health (SDOH) Interventions SDOH Screenings   Food Insecurity: Food Insecurity Present (11/21/2022)  Housing: Patient Unable To Answer (11/22/2022)  Transportation Needs: Patient Unable To Answer (11/22/2022)  Recent Concern: Transportation Needs - Unmet Transportation Needs (11/21/2022)  Utilities: Patient Unable To Answer (11/22/2022)  Alcohol  Screen: Low Risk  (10/23/2019)  Depression (PHQ2-9): High Risk (11/21/2022)  Financial Resource Strain: Low Risk  (11/21/2022)  Physical Activity: Inactive (11/21/2022)  Social Connections: Socially Isolated (11/21/2022)  Stress: Stress Concern Present (11/21/2022)  Tobacco Use: Medium Risk (11/26/2022)  Health Literacy: Adequate Health Literacy (11/21/2022)     Readmission Risk Interventions     No data to display

## 2022-11-30 NOTE — Progress Notes (Addendum)
STROKE TEAM PROGRESS NOTE   BRIEF HPI [name] is a 67 y.o. male with history of anxiety and depression, CHF, hypertension, hyperlipidemia, diabetes, diabetic retinopathy, CAD, cataracts, presenting with hypertensive emergency and hypoglycemia with difficulty caring for himself on 11/19.  During hospitalization he developed altered mental status and an MRI brain was performed that revealed multiple right MCA strokes.  NIH on Admission 0 11/28 MRI brain multiple scattered watershed infarcts in right MCA  SIGNIFICANT HOSPITAL EVENTS   INTERIM HISTORY/SUBJECTIVE  No family at the bedside.  Denies in the bed in no apparent distress. Nonfocal neurological exam  OBJECTIVE  CBC    Component Value Date/Time   WBC 6.4 11/30/2022 0250   RBC 3.52 (L) 11/30/2022 0250   HGB 10.3 (L) 11/30/2022 0250   HCT 32.6 (L) 11/30/2022 0250   PLT 236 11/30/2022 0250   MCV 92.6 11/30/2022 0250   MCH 29.3 11/30/2022 0250   MCHC 31.6 11/30/2022 0250   RDW 14.3 11/30/2022 0250   LYMPHSABS 1.3 11/21/2022 0344   MONOABS 0.5 11/21/2022 0344   EOSABS 0.1 11/21/2022 0344   BASOSABS 0.0 11/21/2022 0344    BMET    Component Value Date/Time   NA 139 11/30/2022 0250   K 4.9 11/30/2022 0730   CL 102 11/30/2022 0250   CO2 25 11/30/2022 0250   GLUCOSE 182 (H) 11/30/2022 0250   BUN 44 (H) 11/30/2022 0250   CREATININE 1.50 (H) 11/30/2022 0250   CALCIUM 8.6 (L) 11/30/2022 0250   GFRNONAA 50 (L) 11/30/2022 0250    IMAGING past 24 hours CT ANGIO HEAD NECK W WO CM  Result Date: 11/29/2022 CLINICAL DATA:  Stroke/TIA, determine embolic source EXAM: CT ANGIOGRAPHY HEAD AND NECK WITH AND WITHOUT CONTRAST TECHNIQUE: Multidetector CT imaging of the head and neck was performed using the standard protocol during bolus administration of intravenous contrast. Multiplanar CT image reconstructions and MIPs were obtained to evaluate the vascular anatomy. Carotid stenosis measurements (when applicable) are  obtained utilizing NASCET criteria, using the distal internal carotid diameter as the denominator. RADIATION DOSE REDUCTION: This exam was performed according to the departmental dose-optimization program which includes automated exposure control, adjustment of the mA and/or kV according to patient size and/or use of iterative reconstruction technique. CONTRAST:  75mL OMNIPAQUE IOHEXOL 350 MG/ML SOLN COMPARISON:  Same day brain MRI FINDINGS: CT HEAD FINDINGS Brain: No hemorrhage. No hydrocephalus. No extra-axial fluid collection. No CT evidence of an acute cortical infarct. No mass effect. No mass lesion. Infarcts seen on same day MRI are not visualized on this exam due to CT technique. Vascular: See below Skull: Normal. Negative for fracture or focal lesion. Sinuses/Orbits: No middle ear or mastoid effusion. Paranasal sinuses are clear. Left lens replacement. Orbits are otherwise unremarkable. Other: None. Review of the MIP images confirms the above findings CTA NECK FINDINGS Aortic arch: Standard branching. Imaged portion shows no evidence of aneurysm or dissection. No significant stenosis of the major arch vessel origins. Right carotid system: No evidence of dissection, stenosis (50% or greater), or occlusion. Mild narrowing of the origin of the right ICA secondary to soft atherosclerotic plaque Left carotid system: No evidence of dissection, stenosis (50% or greater), or occlusion. Mild narrowing of the origin of the left ICA secondary to soft atherosclerotic plaque. Vertebral arteries: Codominant. No evidence of dissection, stenosis (50% or greater), or occlusion. Skeleton: Negative. Other neck: Negative. Upper chest: There are moderate-to-large bilateral pleural effusions. Review of the MIP images confirms the above findings CTA  HEAD FINDINGS Anterior circulation: No significant stenosis, proximal occlusion, aneurysm, or vascular malformation. Mild narrowing of the supraclinoid ICA on the right and in the ICA  terminus. Posterior circulation: No significant stenosis, proximal occlusion, aneurysm, or vascular malformation. Venous sinuses: As permitted by contrast timing, patent. Anatomic variants: None Review of the MIP images confirms the above findings IMPRESSION: 1. No hemorrhage or CT evidence of an acute cortical infarct. Infarcts seen on same day MRI are not visualized on this exam due to CT technique. 2. No intracranial large vessel occlusion or significant stenosis. 3. No hemodynamically significant stenosis in the neck. 4. Moderate-to-large bilateral pleural effusions. Electronically Signed   By: Lorenza Cambridge M.D.   On: 11/29/2022 15:05    Vitals:   11/30/22 0432 11/30/22 0800 11/30/22 0842 11/30/22 1200  BP: 114/60  121/65 128/78  Pulse: 60  60 60  Resp: 16 15 16 16   Temp: (!) 97.3 F (36.3 C)   97.8 F (36.6 C)  TempSrc: Axillary   Oral  SpO2: 94%  96% 96%  Weight: 73.2 kg     Height:         PHYSICAL EXAM General:  Alert, well-nourished, well-developed patient in no acute distress Psych:  Mood and affect appropriate for situation CV: Regular rate and rhythm on monitor Respiratory:  Regular, unlabored respirations on room air GI: Abdomen soft and nontender   NEURO:  Mental Status: AA&Ox3, patient is able to give clear and coherent history Speech/Language: speech is without dysarthria or aphasia.  Naming, repetition, fluency, and comprehension intact.  Cranial Nerves:  II: PERRL. Visual fields full.  III, IV, VI: EOMI. Eyelids elevate symmetrically.  V: Sensation is intact to light touch and symmetrical to face.  VII: Face is symmetrical resting and smiling VIII: hearing intact to voice. IX, X: Palate elevates symmetrically. Phonation is normal.  ZO:[id number] shrug 5/5. XII: tongue is midline without fasciculations. Motor: 5/5 strength to all muscle groups tested.  Tone: is normal and bulk is normal Sensation- Intact to light touch bilaterally. Extinction absent to light  touch to DSS.   Coordination: FTN intact bilaterally, HKS: no ataxia in BLE.No drift.  Gait- deferred  Most Recent NIH   1a Level of Conscious.: 0 1b LOC Questions: 0 1c LOC Commands: 0 2 Best Gaze: 0 3 Visual: 0 4 Facial Palsy: 0 5a Motor Arm - left: 0 5b Motor Arm - Right: 0 6a Motor Leg - Left: 0 6b Motor Leg - Right: 0 7 Limb Ataxia: 0 8 Sensory: 0 9 Best Language: 0 10 Dysarthria: 0 11 Extinct. and Inatten.: 0 TOTAL: 0   ASSESSMENT/PLAN  Acute Ischemic Infarct:  right MCA 4-5 small watershed infarcts, pattern more likely due to hypotension, AKI and dehydration CT no acute finding CTA head & neck no LVO, bilateral ICA siphon, bulb and V4 athero MRI  Punctate foci of restricted diffusion in a watershed distribution in the right hemisphere compatible with acute infarcts. Stable remote lacunar infarct at the genu of the right internal capsule. Stable mild atrophy and white matter disease 2D Echo EF 55 to 60%. The aortic valve is tricuspid. 1.4 x 1 cm nodule on the noncoronary cusp, concern for vegetation  TEE EF 60 to 65%.  LA mildly dilated.  AV no vegetation seen LDL 52 HgbA1c 9.2 UDS neg VTE prophylaxis - anticoagulated with Eliquis aspirin 81 mg daily and Eliquis (apixaban) daily prior to admission, now on home aspirin 81 mg and Eliquis  Therapy recommendations:  SNF Disposition:  Pending  Atrial fibrillation Home Meds: Eliquis Rate controlled Continue anticoagulation with Eliquis  Hypertension Intermittent hypotension Home meds: Spironolactone 25 mg losartan 50 mg Lasix 40 mg Stable on the low end D/c amlodipine Continue hydralazine, cozaar, and spironolactone avoid hypotension Long term BP goal normotensive  Hyperlipidemia Home meds: Atorvastatin 40 mg,  resumed in hospital LDL 52, goal < 70 Continue statin at discharge  Diabetes type II UnControlled Hypoglycemia  Home meds: Insulin Hx of DKA HgbA1c 9.2, goal < 7.0 CBGs SSI Recommend close  follow-up with PCP for better DM control  Other Stroke Risk Factors ETOH use, alcohol level <10, advised to drink no more than 2 drink(s) a day Coronary artery disease CHF Former cigar smoker Advanced age  Other Active Problems Anxiety and depression AKI on CKD 3a, Cre 2.21--2.07--1.55--1.37--1.50  Hospital day # 9  Gevena Mart DNP, ACNPC-AG  [facility]  ATTENDING NOTE: I reviewed above note and agree with the assessment and plan. Pt was seen and examined.   No family at the bedside. He is lying in bed, not in distress, awake, alert, eyes open, orientated to age, place, time. No aphasia, fluent language, following all simple commands. Able to name and repeat. Mild dysarthria. No gaze palsy, tracking bilaterally, visual field full. No facial droop. Tongue midline. RUE no drift, LUE slight pronator drift. RLE proximal 4/5 and LLE proximal 4-/5. BLE distally 4+/5. Sensation symmetrical bilaterally, b/l FTN intact grossly, gait not tested.  Pt stroke pattern likely watershed infarcts from intermittent hypotension, he is on 4 BP po meds, will d/c amlodipine. Continue BP monitoring. Continue home eliquis and ASA, avoid low BP. Continue statin. Encourage po intake and monitor Cre. PT and OT recommend SNF.  For detailed assessment and plan, please refer to above/below as I have made changes wherever appropriate.   Neurology will sign off. Please call with questions. Pt will follow up with stroke clinic NP at [facility] in about 4 weeks. Thanks for the consult.   Marvel Plan, MD PhD Stroke Neurology 11/30/2022 6:15 PM     To contact Stroke Continuity provider, please refer to [email or web address]. After hours, contact General Neurology

## 2022-11-30 NOTE — Progress Notes (Signed)
Speech Language Pathology Treatment: Cognitive-Linquistic  Patient Details Name: [name] [name] MRN: [id number] DOB: Jan 25, 1955 Today's Date: 11/30/2022 Time: 4098-1191 SLP Time Calculation (min) (ACUTE ONLY): 22 min  Assessment / Plan / Recommendation Clinical Impression  MMSE completed at request of neurology. Pt scored a 25/30, losing points in memory and orientation. The score does not reflect additional deficits in awareness, judgment, nor pragmatics such as topic maintenance and turn-taking.  [name] [name] expressive language and vocabulary are excellent.  His output tends to be tangential and he requires cues to stay on task and on subject (this has been variable). His working memory was improved today from prior sessions - he was able to retain and manipulate information in order to respond to questions with 5/5 accuracy. SLP will continue to follow for cognition. He no longer needs f/u for swallowing.   HPI HPI: [name] [name] is a 67 y.o. male who presented 11/20/22 as referral from his PCP secondary to uncontrolled blood pressure. CT head 11/19 with no acute findings.  Pt with presumed aspiration event following episode of vomiting overnight 11/22. Has had confusion, delirium. MRI completed 11/28 with findings of  punctate foci of restricted diffusion in a watershed distribution  in the right hemisphere compatible with acute infarcts. PMH: A-fib on Eliquis, CAD s/p CABG, PAD, diastolic CHF, DM2 on insulin pump, HTN, HLD, depression.      SLP Plan  Continue with current plan of care      Recommendations for follow up therapy are one component of a multi-disciplinary discharge planning process, led by the attending physician.  Recommendations may be updated based on patient status, additional functional criteria and insurance authorization.    Recommendations                     Oral care BID   Frequent or constant Supervision/Assistance Cognitive communication deficit  (R41.841)     Continue with current plan of care    [name] [name] L. Samson Frederic, MA CCC/SLP Clinical Specialist - Acute Care SLP Acute Rehabilitation Services Office number [phone]  [name]  11/30/2022, 10:57 AM

## 2022-11-30 NOTE — Progress Notes (Signed)
Report called to Consuella Lose, RN at [facility] at number provided.

## 2022-11-30 NOTE — Consult Note (Signed)
[facility] Liaison Consult Note    11/30/2022  [name] [name] [birth year] [id number]  Covering Charlesetta Shanks, RN [facility] hospital liaison)  Update: Pt will discharge to [facility] over the weekend for SNF level of care. This facility will continue to address pt's ongoing needs.   Liaison will collaborate with the involved social worker with [facility] concerning pt's discharged disposition.  [facility] Community Care, Population Health does not replace or interfere with any arrangements made by the Inpatient Transition of Care team.    Please contact for any inquires:  Elliot Cousin, RN, [facility] Liaison [place]   [facility], Population Health Office Hours MTWF  8:00 am-6:00 pm Direct Dial: [phone] mobile [phone] [Office toll free line] Office Hours are M-F 8:30 - 5 pm [email or web address]

## 2022-11-30 NOTE — Plan of Care (Signed)
  Problem: Education: Goal: Knowledge of General Education information will improve Description: Including pain rating scale, medication(s)/side effects and non-pharmacologic comfort measures Outcome: Progressing   Problem: Health Behavior/Discharge Planning: Goal: Ability to manage health-related needs will improve Outcome: Progressing   Problem: Clinical Measurements: Goal: Ability to maintain clinical measurements within normal limits will improve Outcome: Progressing Goal: Will remain free from infection Outcome: Progressing Goal: Diagnostic test results will improve Outcome: Progressing Goal: Respiratory complications will improve Outcome: Progressing Goal: Cardiovascular complication will be avoided Outcome: Progressing   Problem: Activity: Goal: Risk for activity intolerance will decrease Outcome: Progressing   Problem: Nutrition: Goal: Adequate nutrition will be maintained Outcome: Progressing   Problem: Coping: Goal: Level of anxiety will decrease Outcome: Progressing   Problem: Elimination: Goal: Will not experience complications related to bowel motility Outcome: Progressing   Problem: Safety: Goal: Ability to remain free from injury will improve Outcome: Progressing   Problem: Skin Integrity: Goal: Risk for impaired skin integrity will decrease Outcome: Progressing   Problem: Education: Goal: Ability to describe self-care measures that may prevent or decrease complications (Diabetes Survival Skills Education) will improve Outcome: Progressing Goal: Individualized Educational Video(s) Outcome: Progressing   Problem: Coping: Goal: Ability to adjust to condition or change in health will improve Outcome: Progressing   Problem: Fluid Volume: Goal: Ability to maintain a balanced intake and output will improve Outcome: Progressing   Problem: Health Behavior/Discharge Planning: Goal: Ability to identify and utilize available resources and services will  improve Outcome: Progressing Goal: Ability to manage health-related needs will improve Outcome: Progressing   Problem: Metabolic: Goal: Ability to maintain appropriate glucose levels will improve Outcome: Progressing   Problem: Nutritional: Goal: Maintenance of adequate nutrition will improve Outcome: Progressing Goal: Progress toward achieving an optimal weight will improve Outcome: Progressing   Problem: Skin Integrity: Goal: Risk for impaired skin integrity will decrease Outcome: Progressing   Problem: Tissue Perfusion: Goal: Adequacy of tissue perfusion will improve Outcome: Progressing   Problem: Safety: Goal: Non-violent Restraint(s) Outcome: Progressing   Problem: Education: Goal: Knowledge of disease or condition will improve Outcome: Progressing Goal: Knowledge of secondary prevention will improve (MUST DOCUMENT ALL) Outcome: Progressing Goal: Knowledge of patient specific risk factors will improve Loraine Leriche N/A or DELETE if not current risk factor) Outcome: Progressing   Problem: Ischemic Stroke/TIA Tissue Perfusion: Goal: Complications of ischemic stroke/TIA will be minimized Outcome: Progressing   Problem: Coping: Goal: Will verbalize positive feelings about self Outcome: Progressing Goal: Will identify appropriate support needs Outcome: Progressing   Problem: Health Behavior/Discharge Planning: Goal: Ability to manage health-related needs will improve Outcome: Progressing Goal: Goals will be collaboratively established with patient/family Outcome: Progressing   Problem: Self-Care: Goal: Ability to participate in self-care as condition permits will improve Outcome: Progressing Goal: Verbalization of feelings and concerns over difficulty with self-care will improve Outcome: Progressing Goal: Ability to communicate needs accurately will improve Outcome: Progressing   Problem: Nutrition: Goal: Risk of aspiration will decrease Outcome:  Progressing Goal: Dietary intake will improve Outcome: Progressing

## 2022-11-30 NOTE — Discharge Summary (Signed)
Physician Discharge Summary  [name] [name] WUJ:[id number] DOB: February 12, 1955 DOA: 11/20/2022  PCP: Bary Leriche, PA-C  Admit date: 11/20/2022 Discharge date: 11/30/2022  Admitted From: Home Disposition:  SNF   Recommendations for Outpatient Follow-up:  Follow up with PCP Follow-up with neurology outpatient  Discharge Condition: Stable CODE STATUS: Full code Diet recommendation: Heart healthy/carb modified  Brief/Interim Summary: [name] [name] is a 67 yrs old male with PMH significant for diabetes mellitus II, on insulin pump for last 3 years, CAD with history of CABG in 2003, hypertension, peripheral vascular disease, paroxysmal A-fib on Eliquis, with slow ventricular response, hyperlipidemia who  was sent by his PCP to ED for uncontrolled blood pressure, he has not been able to take care of himself at home, reports he had 2 episodes of hypoglycemia where he was found unconscious on the floor over last 2 weeks due to poor oral intake, as well patient has not been compliant with medications.   He was found to have significantly high blood pressure in the ED 233/102. Chest x-ray significant for increased  vascular congestion > pulmonary edema, Patient required multiple medications for blood pressure control including p.o. clonidine, losartan, IV hydralazine, blood pressure remains elevated, his BNP was  elevated at 262.   He was admitted for hypertensive urgency as well as recurrent hypoglycemic episodes.  Hospitalization was complicated by intermittent delirium and hallucination.  Psychiatry was consulted.  MRI brain was obtained which showed watershed infarct.  Neurology was consulted.  Stroke thought to be secondary to noncompliance with Eliquis, hypertension.  Patient completed MMSE in the hospital and scored 25 out of 30.  I do suspect that he has some level of mild cognitive impair and sundowning.  This was discussed with son.  After his skilled nursing facility stay for PT,  I recommend either home health nurse aide or placement in long-term care facility for further medication management and custodial care.  Discharge Diagnoses:   Principal Problem:   Hypertensive emergency Active Problems:   Coronary artery disease   Benign prostatic hyperplasia without lower urinary tract symptoms   PAF (paroxysmal atrial fibrillation) (HCC)   Acute exacerbation of CHF (congestive heart failure) (HCC)   Diabetes mellitus (HCC)   Atrial fibrillation with slow ventricular response (HCC)   Heart valve vegetation   CVA (cerebral vascular accident) (HCC)    Hypertensive urgency -Insetting of medical noncompliance -Hydralazine, losartan, spironolactone -No beta-blockers due to slow ventricular response -Avoid clonidine due to withdrawals -Blood pressure improved    Acute on chronic diastolic CHF -Presented with volume overload, elevated BNP -Echocardiogram showed EF 55 to 60% -Improved, euvolemic   Suspected aortic vegetation -Infectious disease consulted -TEE negative for vegetation   Diabetes mellitus, uncontrolled with hypoglycemia -Has had couple of episodes of hypoglycemia as outpatient.  Patient poor candidate for insulin pump due to noncompliance -Discussed today with diabetic coordinator, plan to discharge on Tradjenta.  I will discontinue insulin outpatient due to noncompliance with medication as well as diet, intermittent hypoglycemic episodes.  Would likely not qualify for glucose sensor insurance coverage   Delirium -Has had some acute delirium in the hospital, visual hallucinations, agitation -Psychiatry was consulted -Question underlying progression to vascular dementia with sundowning   Stroke -Neurology consulted -Likely combination from his hypotension, hypoglycemia and intracranial stenosis.  -Continue eliquis and ASA and statin, follow up at [facility]   Paroxysmal A-fib -Not on rate control medication due to slow ventricular response -Eliquis    CAD status post CABG -Aspirin, statin  Deconditioning -PT OT recommending SNF placement   AKI -Baseline creatinine 0.9-1 -Patient given IV fluids, improved creatinine -Continue to encourage oral intake   Stage I pressure ulcer, POA -Stage I on buttock, stage I on left heel   Discharge Instructions  Discharge Instructions     Ambulatory referral to Neurology   Complete by: As directed    An appointment is requested in approximately: 4 weeks   Diet Carb Modified   Complete by: As directed    Face-to-face encounter (required for Medicare/Medicaid patients)   Complete by: As directed    I Renne Crigler certify that this patient is under my care and that I, or a nurse practitioner or physician's assistant working with me, had a face-to-face encounter that meets the physician face-to-face encounter requirements with this patient on 11/20/2022. The encounter with the patient was in whole, or in part for the following medical condition(s) which is the primary reason for home health care (List medical condition): diabetes complications/frequent hypoglycemic episodes, uncontrolled hypertension, frequent falls, medication non-complicance   The encounter with the patient was in whole, or in part, for the following medical condition, which is the primary reason for home health care: diabetes complications/frequent hypoglycemic episodes, uncontrolled hypertension, frequent falls, medication non-complicance   I certify that, based on my findings, the following services are medically necessary home health services:  Nursing Physical therapy     Reason for Medically Necessary Home Health Services:  Skilled Nursing- Change/Decline in Patient Status Skilled Nursing- Lab Monitoring Requiring Frequent Medication Adjustment     My clinical findings support the need for the above services:  Cognitive impairments, dementia, or mental confusion  that make it unsafe to leave home Unable to leave home  safely without assistance and/or assistive device Unsafe ambulation due to balance issues     Further, I certify that my clinical findings support that this patient is homebound due to:  Mental confusion Unable to leave home safely without assistance     Home Health   Complete by: As directed    To provide the following care/treatments:  PT RN Social work Home Health Aide     Increase activity slowly   Complete by: As directed    No wound care   Complete by: As directed       Allergies as of 11/30/2022       Reactions   Wool Alcohol [lanolin] Hives, Itching   Wool fabric        Medication List     STOP taking these medications    Dexcom G7 Sensor Misc   furosemide 40 MG tablet Commonly known as: LASIX   insulin aspart 100 UNIT/ML injection Commonly known as: NovoLOG   Omnipod 5 G7 Pods (Gen 5) Misc       TAKE these medications    acetaminophen 500 MG tablet Commonly known as: TYLENOL Take 500 mg by mouth as needed for mild pain or moderate pain.   apixaban 5 MG Tabs tablet Commonly known as: Eliquis Take 1 tablet (5 mg total) by mouth 2 (two) times daily.   aspirin EC 81 MG tablet Take 1 tablet (81 mg total) by mouth daily. What changed: when to take this   atorvastatin 40 MG tablet Commonly known as: LIPITOR TAKE 1 TABLET BY MOUTH EVERY DAY   Disposable Brief Large Misc 1 each by Does not apply route as needed.   hydrOXYzine 25 MG tablet Commonly known as: ATARAX Take 1 tablet (25 mg total) by  mouth 3 (three) times daily as needed for anxiety (sleep).   linagliptin 5 MG Tabs tablet Commonly known as: Tradjenta Take 1 tablet (5 mg total) by mouth daily.   loperamide 2 MG capsule Commonly known as: IMODIUM Take 2 mg by mouth 4 (four) times daily as needed for diarrhea or loose stools.   losartan 50 MG tablet Commonly known as: COZAAR Take 1 tablet (50 mg total) by mouth daily.   mirtazapine 15 MG tablet Commonly known as:  REMERON TAKE 1 TABLET BY MOUTH EVERYDAY AT BEDTIME   multivitamin with minerals tablet Take 1 tablet by mouth daily.   PARoxetine 20 MG tablet Commonly known as: PAXIL TAKE 1 TABLET BY MOUTH EVERY DAY What changed: when to take this   QUEtiapine 25 MG tablet Commonly known as: SEROQUEL Take 1 tablet (25 mg total) by mouth at bedtime.   spironolactone 25 MG tablet Commonly known as: ALDACTONE TAKE 1 TABLET BY MOUTH EVERY DAY        Contact information for follow-up providers     [facility] Follow up.   Specialty: Home Health Services Why: Home Health with RN. PT and SW services. RN will contact you to set up initial visit. Contact information: [address] [place] [address] [id number]         [name], [name], PA-C Follow up.   Specialty: Physician Assistant Contact information: [address] [place] [address] [phone]         [facility] Follow up in 4 week(s).   Why: Referral sent for stroke follow up Contact information: [address]     [address] [place] [address] [phone]             Contact information for after-discharge care     Destination     [facility] Preferred SNF .   Service: Skilled Nursing Contact information: [address] [place] [address] [phone]                    Allergies  Allergen Reactions   Wool Alcohol [Lanolin] Hives and Itching    Wool fabric   Procedures/Studies: CT ANGIO HEAD NECK W WO CM  Result Date: 11/29/2022 CLINICAL DATA:  Stroke/TIA, determine embolic source EXAM: CT ANGIOGRAPHY HEAD AND NECK WITH AND WITHOUT CONTRAST TECHNIQUE: Multidetector CT imaging of the head and neck was performed using the standard protocol during bolus administration of intravenous contrast. Multiplanar CT image reconstructions and MIPs were obtained to evaluate the vascular  anatomy. Carotid stenosis measurements (when applicable) are obtained utilizing NASCET criteria, using the distal internal carotid diameter as the denominator. RADIATION DOSE REDUCTION: This exam was performed according to the departmental dose-optimization program which includes automated exposure control, adjustment of the mA and/or kV according to patient size and/or use of iterative reconstruction technique. CONTRAST:  75mL OMNIPAQUE IOHEXOL 350 MG/ML SOLN COMPARISON:  Same day brain MRI FINDINGS: CT HEAD FINDINGS Brain: No hemorrhage. No hydrocephalus. No extra-axial fluid collection. No CT evidence of an acute cortical infarct. No mass effect. No mass lesion. Infarcts seen on same day MRI are not visualized on this exam due to CT technique. Vascular: See below Skull: Normal. Negative for fracture or focal lesion. Sinuses/Orbits: No middle ear or mastoid effusion. Paranasal sinuses are clear. Left lens replacement. Orbits are otherwise unremarkable. Other: None. Review of the MIP images confirms the above findings CTA NECK FINDINGS Aortic arch: Standard branching.  Imaged portion shows no evidence of aneurysm or dissection. No significant stenosis of the major arch vessel origins. Right carotid system: No evidence of dissection, stenosis (50% or greater), or occlusion. Mild narrowing of the origin of the right ICA secondary to soft atherosclerotic plaque Left carotid system: No evidence of dissection, stenosis (50% or greater), or occlusion. Mild narrowing of the origin of the left ICA secondary to soft atherosclerotic plaque. Vertebral arteries: Codominant. No evidence of dissection, stenosis (50% or greater), or occlusion. Skeleton: Negative. Other neck: Negative. Upper chest: There are moderate-to-large bilateral pleural effusions. Review of the MIP images confirms the above findings CTA HEAD FINDINGS Anterior circulation: No significant stenosis, proximal occlusion, aneurysm, or vascular malformation. Mild  narrowing of the supraclinoid ICA on the right and in the ICA terminus. Posterior circulation: No significant stenosis, proximal occlusion, aneurysm, or vascular malformation. Venous sinuses: As permitted by contrast timing, patent. Anatomic variants: None Review of the MIP images confirms the above findings IMPRESSION: 1. No hemorrhage or CT evidence of an acute cortical infarct. Infarcts seen on same day MRI are not visualized on this exam due to CT technique. 2. No intracranial large vessel occlusion or significant stenosis. 3. No hemodynamically significant stenosis in the neck. 4. Moderate-to-large bilateral pleural effusions. Electronically Signed   By: Lorenza Cambridge M.D.   On: 11/29/2022 15:05   MR BRAIN WO CONTRAST  Result Date: 11/29/2022 CLINICAL DATA:  Mental status change, unknown cause. EXAM: MRI HEAD WITHOUT CONTRAST TECHNIQUE: Multiplanar, multiecho pulse sequences of the brain and surrounding structures were obtained without intravenous contrast. COMPARISON:  CT head without contrast 11/20/2022. FINDINGS: Brain: Punctate foci of restricted diffusion are present in a watershed distribution in the right hemisphere. A remote lacunar infarct at the genu of the right internal capsule is stable. Mild atrophy and white matter change is otherwise stable. The ventricles are of normal size. No significant extraaxial fluid collection is present. The brainstem and cerebellum are within normal limits. The internal auditory canals are within normal limits. Midline structures are within normal limits. Vascular: Flow is present in the major intracranial arteries. Skull and upper cervical spine: The craniocervical junction is normal. Upper cervical spine is within normal limits. Marrow signal is unremarkable. Sinuses/Orbits: The paranasal sinuses and mastoid air cells are clear. Left lens replacement is noted. Globes and orbits are otherwise within normal limits. IMPRESSION: 1. Punctate foci of restricted  diffusion in a watershed distribution in the right hemisphere compatible with acute infarcts. Question decreased right-sided perfusion or central embolic foci. 2. Stable remote lacunar infarct at the genu of the right internal capsule. 3. Stable mild atrophy and white matter disease. This likely reflects the sequela of chronic microvascular ischemia. These results will be called to the ordering clinician or representative by the Radiologist Assistant, and communication documented in the PACS or [employer]. Electronically Signed   By: Marin Roberts M.D.   On: 11/29/2022 13:11   ECHO TEE  Result Date: 11/26/2022    TRANSESOPHOGEAL ECHO REPORT   Patient Name:   [name] [name] Date of Exam: 11/26/2022 Medical Rec #:  [id number]            Height:       74.0 in Accession #:    [id number]           Weight:       159.2 lb Date of Birth:  Jul 06, 1955             BSA:  1.972 m Patient Age:    51 years             BP:           76/49 mmHg Patient Gender: M                    HR:           61 bpm. Exam Location:  Inpatient Procedure: Transesophageal Echo, Cardiac Doppler and Color Doppler Indications:     Endocarditis  History:         Patient has prior history of Echocardiogram examinations, most                  recent 11/21/2022. CHF, CAD, Prior CABG, Arrythmias:Atrial                  Fibrillation and Bradycardia; Risk Factors:Hypertension,                  Diabetes and Dyslipidemia.  Sonographer:     Lucendia Herrlich RCS Referring Phys:  [id number] Perlie Gold Diagnosing Phys: Epifanio Lesches MD PROCEDURE: After discussion of the risks and benefits of a TEE, an informed consent was obtained from the patient. The transesophogeal probe was passed without difficulty through the esophogus of the patient. Imaged were obtained with the patient in a supine position. Sedation performed by different physician. The patient was monitored while under deep sedation. Anesthestetic sedation was provided  intravenously by Anesthesiology: 130mg  of Propofol. The patient developed no complications during the procedure.  IMPRESSIONS  1. Left ventricular ejection fraction, by estimation, is 60 to 65%. The left ventricle has normal function.  2. Right ventricular systolic function is mildly reduced. The right ventricular size is normal.  3. Left atrial size was mildly dilated. No left atrial/left atrial appendage thrombus was detected.  4. The mitral valve is normal in structure. Trivial mitral valve regurgitation.  5. Aortic dilatation noted. There is mild dilatation of the aortic root, measuring 40 mm.  6. The aortic valve is tricuspid. There is moderate thickening of the aortic valve. Aortic valve regurgitation is not visualized. Aortic valve sclerosis/calcification is present, without any evidence of aortic stenosis. Thickened aortic valve leaflets, but no vegetation seen Conclusion(s)/Recommendation(s): No evidence of vegetation/infective endocarditis on this transesophageael echocardiogram. FINDINGS  Left Ventricle: Left ventricular ejection fraction, by estimation, is 60 to 65%. The left ventricle has normal function. The left ventricular internal cavity size was normal in size. Right Ventricle: The right ventricular size is normal. No increase in right ventricular wall thickness. Right ventricular systolic function is mildly reduced. Left Atrium: Left atrial size was mildly dilated. No left atrial/left atrial appendage thrombus was detected. Right Atrium: Right atrial size was normal in size. Pericardium: There is no evidence of pericardial effusion. Mitral Valve: The mitral valve is normal in structure. Trivial mitral valve regurgitation. Tricuspid Valve: The tricuspid valve is normal in structure. Tricuspid valve regurgitation is trivial. Aortic Valve: The aortic valve is tricuspid. There is moderate thickening of the aortic valve. Aortic valve regurgitation is not visualized. Aortic valve sclerosis/calcification  is present, without any evidence of aortic stenosis. Pulmonic Valve: The pulmonic valve was grossly normal. Pulmonic valve regurgitation is trivial. Aorta: Aortic dilatation noted. There is mild dilatation of the aortic root, measuring 40 mm. IAS/Shunts: No atrial level shunt detected by color flow Doppler. Additional Comments: Spectral Doppler performed. LEFT VENTRICLE PLAX 2D LVOT diam:     2.30 cm LVOT Area:  4.15 cm   SHUNTS Systemic Diam: 2.30 cm Epifanio Lesches MD Electronically signed by Epifanio Lesches MD Signature Date/Time: 11/26/2022/1:42:04 PM    Final    EP STUDY  Result Date: 11/26/2022 See surgical note for result.  CT ABDOMEN PELVIS WO CONTRAST  Result Date: 11/24/2022 CLINICAL DATA:  Concern for bowel obstruction, vomiting. EXAM: CT ABDOMEN AND PELVIS WITHOUT CONTRAST TECHNIQUE: Multidetector CT imaging of the abdomen and pelvis was performed following the standard protocol without IV contrast. RADIATION DOSE REDUCTION: This exam was performed according to the departmental dose-optimization program which includes automated exposure control, adjustment of the mA and/or kV according to patient size and/or use of iterative reconstruction technique. COMPARISON:  CT abdomen pelvis dated 07/18/2021. FINDINGS: Lower chest: Moderate to large bilateral pleural effusions with associated atelectasis. There are moderate ground-glass opacities and tree-in-bud opacities in the visible lower lungs. Hepatobiliary: No focal liver abnormality is seen. No gallstones, gallbladder wall thickening, or biliary dilatation. Pancreas: Unremarkable. No pancreatic ductal dilatation or surrounding inflammatory changes. Spleen: Normal in size without focal abnormality. Adrenals/Urinary Tract: Adrenal glands are unremarkable. Kidneys are normal, without renal calculi, focal lesion, or hydronephrosis. Bladder is unremarkable. Stomach/Bowel: Stomach is within normal limits. Appendix appears normal. No  evidence of bowel wall thickening, distention, or inflammatory changes. Vascular/Lymphatic: Aortic atherosclerosis. The infrarenal abdominal aorta measures 3.0 cm in diameter, unchanged. Aneurysmal dilatation of the right common iliac artery appears unchanged, measuring 2.7 cm in diameter. No enlarged abdominal or pelvic lymph nodes. Reproductive: A penile pump with reservoir in the left hemipelvis is redemonstrated. The prostate is unremarkable. Other: No abdominal wall hernia or abnormality. No abdominopelvic ascites. Musculoskeletal: Degenerative changes are seen in the spine. IMPRESSION: 1. No acute process in the abdomen or pelvis. No evidence of bowel obstruction. 2. Moderate to large bilateral pleural effusions with associated atelectasis. Moderate ground-glass opacities and tree-in-bud opacities in the visible lower lungs may reflect pulmonary edema or infection. 3. Infrarenal abdominal aortic aneurysm measuring 3.0 cm. Recommend follow-up ultrasound every 3 years. (Ref.: J Vasc Surg. 2018; 67:2-77 and J Am Coll Radiol 2013;10(10):789-794.) Aortic Atherosclerosis (ICD10-I70.0). Electronically Signed   By: Romona Curls M.D.   On: 11/24/2022 15:25   DG CHEST PORT 1 VIEW  Result Date: 11/24/2022 CLINICAL DATA:  [id number] Vomiting, unspecified [id number] EXAM: PORTABLE CHEST 1 VIEW COMPARISON:  November 24, 2022 FINDINGS: The cardiomediastinal silhouette is unchanged in contour.Status post median sternotomy and CABG. Atherosclerotic calcifications. Moderate RIGHT and small LEFT pleural effusion. No pneumothorax. Similar perihilar predominant interstitial opacities with more confluent bibasilar airspace opacities. IMPRESSION: Similar findings of pulmonary edema with moderate RIGHT and small LEFT pleural effusions and favored scattered atelectasis. Electronically Signed   By: Meda Klinefelter M.D.   On: 11/24/2022 12:01   US RENAL  Result Date: 11/24/2022 CLINICAL DATA:  [id number] AKI (acute kidney injury)  (HCC) [id number] EXAM: RENAL / URINARY TRACT ULTRASOUND COMPLETE COMPARISON:  January 04, 2022 FINDINGS: Right Kidney: Renal measurements: 13.0 x 5.4 x 6.0 cm = volume: 176 mL. Echogenicity within normal limits. No mass or hydronephrosis visualized. Left Kidney: Renal measurements: 13.5 x 6.3 x 5.3 cm = volume: 236 mL. Echogenicity within normal limits. No mass or hydronephrosis visualized. Portions are obscured by shadowing bowel gas. Bladder: Bladder appears moderately distended and has an estimated volume of 1351 ML. Other: Bilateral pleural effusions. IMPRESSION: 1. No hydronephrosis. 2. Bilateral pleural effusions. 3. Bladder appears moderately distended. Electronically Signed   By: Meda Klinefelter M.D.   On: 11/24/2022 12:00  ECHOCARDIOGRAM COMPLETE  Result Date: 11/21/2022    ECHOCARDIOGRAM REPORT   Patient Name:   [name] [name] Date of Exam: 11/21/2022 Medical Rec #:  [id number]            Height:       74.0 in Accession #:    [id number]           Weight:       164.7 lb Date of Birth:  02-19-1955             BSA:          2.001 m Patient Age:    67 years             BP:           145/81 mmHg Patient Gender: M                    HR:           54 bpm. Exam Location:  Inpatient Procedure: 2D Echo, Color Doppler and Cardiac Doppler Indications:    Pulmonary edema  History:        Patient has prior history of Echocardiogram examinations, most                 recent 01/04/2022. CHF; Arrythmias:Atrial Fibrillation and Atrial                 Flutter.  Sonographer:    Darlys Gales Referring Phys: [id number] [address] EL[place]W[address] IMPRESSIONS  1. Left ventricular ejection fraction, by estimation, is 55 to 60%. The left ventricle has normal function. Left ventricular endocardial border not optimally defined to evaluate regional wall motion. There is mild concentric left ventricular hypertrophy. Left ventricular diastolic parameters are indeterminate.  2. Right ventricular systolic function is mildly reduced. The right  ventricular size is mildly enlarged. There is normal pulmonary artery systolic pressure. The estimated right ventricular systolic pressure is 32.6 mmHg.  3. Left atrial size was mildly dilated.  4. Right atrial size was mildly dilated.  5. The mitral valve is normal in structure. Trivial mitral valve regurgitation. No evidence of mitral stenosis.  6. The aortic valve is tricuspid. 1.4 x 1 cm nodule on the noncoronary cusp, concern for vegetation and would suggest TEE evaluation. Aortic valve regurgitation is not visualized. No aortic stenosis is present.  7. Aortic dilatation noted. There is mild dilatation of the ascending aorta, measuring 38 mm.  8. The inferior vena cava is normal in size with <50% respiratory variability, suggesting right atrial pressure of 8 mmHg.  9. Non-sinus rhythm, ?atrial fibrillation FINDINGS  Left Ventricle: Left ventricular ejection fraction, by estimation, is 55 to 60%. The left ventricle has normal function. Left ventricular endocardial border not optimally defined to evaluate regional wall motion. The left ventricular internal cavity size was normal in size. There is mild concentric left ventricular hypertrophy. Left ventricular diastolic parameters are indeterminate. Right Ventricle: The right ventricular size is mildly enlarged. No increase in right ventricular wall thickness. Right ventricular systolic function is mildly reduced. There is normal pulmonary artery systolic pressure. The tricuspid regurgitant velocity  is 2.48 m/s, and with an assumed right atrial pressure of 8 mmHg, the estimated right ventricular systolic pressure is 32.6 mmHg. Left Atrium: Left atrial size was mildly dilated. Right Atrium: Right atrial size was mildly dilated. Pericardium: There is no evidence of pericardial effusion. Mitral Valve: The mitral valve is normal in structure. There is mild calcification of the mitral valve  leaflet(s). Mild mitral annular calcification. Trivial mitral valve  regurgitation. No evidence of mitral valve stenosis. Tricuspid Valve: The tricuspid valve is normal in structure. Tricuspid valve regurgitation is trivial. Aortic Valve: The aortic valve is tricuspid. Aortic valve regurgitation is not visualized. No aortic stenosis is present. Aortic valve mean gradient measures 3.0 mmHg. Aortic valve peak gradient measures 5.0 mmHg. Aortic valve area, by VTI measures 1.95 cm. Pulmonic Valve: The pulmonic valve was normal in structure. Pulmonic valve regurgitation is trivial. Aorta: Aortic dilatation noted. There is mild dilatation of the ascending aorta, measuring 38 mm. Venous: The inferior vena cava is normal in size with less than 50% respiratory variability, suggesting right atrial pressure of 8 mmHg. IAS/Shunts: No atrial level shunt detected by color flow Doppler.  LEFT VENTRICLE PLAX 2D LVIDd:         4.40 cm   Diastology LVIDs:         2.10 cm   LV e' medial:    4.90 cm/s LV PW:         1.20 cm   LV E/e' medial:  21.4 LV IVS:        1.30 cm   LV e' lateral:   6.53 cm/s LVOT diam:     2.00 cm   LV E/e' lateral: 16.1 LV SV:         45 LV SV Index:   22 LVOT Area:     3.14 cm  RIGHT VENTRICLE RV S prime:     11.70 cm/s TAPSE (M-mode): 1.3 cm LEFT ATRIUM           Index        RIGHT ATRIUM           Index LA Vol (A4C): 74.5 ml 37.23 ml/m  RA Area:     17.10 cm                                    RA Volume:   41.30 ml  20.64 ml/m  AORTIC VALVE AV Area (Vmax):    1.70 cm AV Area (Vmean):   1.86 cm AV Area (VTI):     1.95 cm AV Vmax:           112.00 cm/s AV Vmean:          79.200 cm/s AV VTI:            0.230 m AV Peak Grad:      5.0 mmHg AV Mean Grad:      3.0 mmHg LVOT Vmax:         60.70 cm/s LVOT Vmean:        47.000 cm/s LVOT VTI:          0.143 m LVOT/AV VTI ratio: 0.62  AORTA Ao Root diam: 3.60 cm Ao Asc diam:  3.80 cm MITRAL VALVE                TRICUSPID VALVE MV Area (PHT): 2.55 cm     TR Peak grad:   24.6 mmHg MV Decel Time: 298 msec     TR Vmax:        248.00  cm/s MV E velocity: 105.00 cm/s                             SHUNTS  Systemic VTI:  0.14 m                             Systemic Diam: 2.00 cm Dalton McleanMD Electronically signed by Wilfred Lacy Signature Date/Time: 11/21/2022/3:07:50 PM    Final    CT Head Wo Contrast  Result Date: 11/20/2022 CLINICAL DATA:  Mental status change, unknown cause. Confusion. Shortness of breath. EXAM: CT HEAD WITHOUT CONTRAST TECHNIQUE: Contiguous axial images were obtained from the base of the skull through the vertex without intravenous contrast. RADIATION DOSE REDUCTION: This exam was performed according to the departmental dose-optimization program which includes automated exposure control, adjustment of the mA and/or kV according to patient size and/or use of iterative reconstruction technique. COMPARISON:  CT scan head from 01/03/2022. FINDINGS: Brain: No evidence of acute infarction, hemorrhage, hydrocephalus, extra-axial collection or mass lesion/mass effect. There is bilateral periventricular hypodensity, which is non-specific but most likely seen in the settings of microvascular ischemic changes. Mild in extent. Otherwise normal appearance of brain parenchyma. Ventricles are normal. Cerebral volume is age appropriate. Vascular: No hyperdense vessel or unexpected calcification. Intracranial arteriosclerosis. Skull: Normal. Negative for fracture or focal lesion. Sinuses/Orbits: No acute finding. Other: Visualized mastoid air cells are unremarkable. No mastoid effusion. IMPRESSION: *No acute intracranial abnormality. Electronically Signed   By: Jules Schick M.D.   On: 11/20/2022 11:30   DG Chest 2 View  Result Date: 11/20/2022 CLINICAL DATA:  Weakness. Elevated blood pressure. Shortness of breath. EXAM: CHEST - 2 VIEW COMPARISON:  01/03/2022. FINDINGS: There is mild-to-moderate pulmonary vascular congestion. There is small-to-moderate right pleural effusion with probable associated  compressive atelectatic changes in the right lung. There is probable small layering left pleural effusion as well. There is an approximately 1.5 x 2.0 cm nodular opacity abutting the left lateral hemidiaphragm, which is incompletely characterized on the current examination but favored to represent atelectasis. Attention on follow-up examination is recommended. Bilateral lung fields are otherwise clear. No pneumothorax. Stable cardio-mediastinal silhouette. There are surgical staples along the heart border and sternotomy wires, status post CABG (coronary artery bypass graft). No acute osseous abnormalities. The soft tissues are within normal limits. IMPRESSION: *Findings compatible with congestive heart failure/pulmonary edema, as described above. *There is a nodular 1.5 x 2.0 cm opacity overlying the left lower lung zone, favored to represent atelectasis. Attention on follow-up examination is recommended. Electronically Signed   By: Jules Schick M.D.   On: 11/20/2022 11:28      Discharge Exam: Vitals:   11/30/22 1200 11/30/22 1412  BP: 128/78 120/62  Pulse: 60   Resp: 16 16  Temp: 97.8 F (36.6 C)   SpO2: 96%     General: Pt is alert, awake, not in acute distress Cardiovascular: S1/S2 +, no edema Respiratory: CTA bilaterally, no wheezing, no rhonchi, no respiratory distress, no conversational dyspnea  Abdominal: Soft, NT, ND, bowel sounds + Extremities: no edema, no cyanosis Psych: Normal mood and affect, alert and oriented x 3    The results of significant diagnostics from this hospitalization (including imaging, microbiology, ancillary and laboratory) are listed below for reference.     Microbiology: Recent Results (from the past 240 hour(s))  Culture, blood (Routine X 2) w Reflex to ID Panel     Status: None   Collection Time: 11/22/22  2:49 PM   Specimen: BLOOD RIGHT ARM  Result Value Ref Range Status   Specimen Description BLOOD RIGHT ARM  Final   Special Requests  Final     BOTTLES DRAWN AEROBIC AND ANAEROBIC Blood Culture results may not be optimal due to an inadequate volume of blood received in culture bottles   Culture   Final    NO GROWTH 5 DAYS Performed at [facility], [address] [place], [place] [address]    Report Status 11/27/2022 FINAL  Final  Culture, blood (Routine X 2) w Reflex to ID Panel     Status: None   Collection Time: 11/22/22  2:51 PM   Specimen: BLOOD RIGHT ARM  Result Value Ref Range Status   Specimen Description BLOOD RIGHT ARM  Final   Special Requests   Final    BOTTLES DRAWN AEROBIC AND ANAEROBIC Blood Culture results may not be optimal due to an inadequate volume of blood received in culture bottles   Culture   Final    NO GROWTH 5 DAYS Performed at [facility], [address], [place], [place] [address]    Report Status 11/27/2022 FINAL  Final     Labs: BNP (last 3 results) Recent Labs    11/20/22 1049  BNP 262.5*   Basic Metabolic Panel: Recent Labs  Lab 11/24/22 0654 11/25/22 0256 11/26/22 0343 11/27/22 0301 11/28/22 0243 11/29/22 0231 11/30/22 0250 11/30/22 0730  NA 138   < > 135 136 139 136 139  --   K 4.7   < > 4.5 4.3 4.7 4.5 5.4* 4.9  CL 102   < > 96* 101 103 100 102  --   CO2 26   < > 28 29 28 30 25   --   GLUCOSE 164*   < > 204* 161* 148* 226* 182*  --   BUN 52*   < > 59* 61* 53* 49* 44*  --   CREATININE 2.21*   < > 2.07* 2.04* 1.55* 1.37* 1.50*  --   CALCIUM 8.3*   < > 8.5* 7.9* 8.5* 8.5* 8.6*  --   MG 2.0  --  2.2  --  2.5*  --   --   --   PHOS 5.0*  --  3.4 3.4 3.8  --   --   --    < > = values in this interval not displayed.   Liver Function Tests: Recent Labs  Lab 11/27/22 0301  ALBUMIN 2.0*   No results for input(s): "LIPASE", "AMYLASE" in the last 168 hours. No results for input(s): "AMMONIA" in the last 168 hours. CBC: Recent Labs  Lab 11/24/22 0654 11/26/22 0343 11/28/22 0243 11/30/22 0250  WBC 8.9 6.9 6.4 6.4  HGB 10.7* 11.4* 10.0* 10.3*  HCT 33.7* 36.7*  32.3* 32.6*  MCV 93.1 94.3 94.2 92.6  PLT 175 248 215 236   Cardiac Enzymes: No results for input(s): "CKTOTAL", "CKMB", "CKMBINDEX", "TROPONINI" in the last 168 hours. BNP: Invalid input(s): "POCBNP" CBG: Recent Labs  Lab 11/29/22 1133 11/29/22 1639 11/29/22 2057 11/30/22 0557 11/30/22 1158  GLUCAP 157* 149* 159* 163* 293*   D-Dimer No results for input(s): "DDIMER" in the last 72 hours. Hgb A1c Recent Labs    11/30/22 0250  HGBA1C 9.2*   Lipid Profile Recent Labs    11/30/22 0250  CHOL 117  HDL 48  LDLCALC 52  TRIG 84  CHOLHDL 2.4   Thyroid function studies No results for input(s): "TSH", "T4TOTAL", "T3FREE", "THYROIDAB" in the last 72 hours.  Invalid input(s): "FREET3" Anemia work up No results for input(s): "VITAMINB12", "FOLATE", "FERRITIN", "TIBC", "IRON", "RETICCTPCT" in the  last 72 hours. Urinalysis    Component Value Date/Time   COLORURINE YELLOW 11/29/2022 2220   APPEARANCEUR HAZY (A) 11/29/2022 2220   LABSPEC 1.024 11/29/2022 2220   PHURINE 5.0 11/29/2022 2220   GLUCOSEU 50 (A) 11/29/2022 2220   GLUCOSEU >=1000 (A) 03/16/2020 0958   HGBUR NEGATIVE 11/29/2022 2220   BILIRUBINUR NEGATIVE 11/29/2022 2220   KETONESUR 5 (A) 11/29/2022 2220   PROTEINUR 100 (A) 11/29/2022 2220   UROBILINOGEN 0.2 03/16/2020 0958   NITRITE NEGATIVE 11/29/2022 2220   LEUKOCYTESUR NEGATIVE 11/29/2022 2220   Sepsis Labs Recent Labs  Lab 11/24/22 0654 11/26/22 0343 11/28/22 0243 11/30/22 0250  WBC 8.9 6.9 6.4 6.4   Microbiology Recent Results (from the past 240 hour(s))  Culture, blood (Routine X 2) w Reflex to ID Panel     Status: None   Collection Time: 11/22/22  2:49 PM   Specimen: BLOOD RIGHT ARM  Result Value Ref Range Status   Specimen Description BLOOD RIGHT ARM  Final   Special Requests   Final    BOTTLES DRAWN AEROBIC AND ANAEROBIC Blood Culture results may not be optimal due to an inadequate volume of blood received in culture bottles   Culture    Final    NO GROWTH 5 DAYS Performed at [facility], [address] [place], [place] [address]    Report Status 11/27/2022 FINAL  Final  Culture, blood (Routine X 2) w Reflex to ID Panel     Status: None   Collection Time: 11/22/22  2:51 PM   Specimen: BLOOD RIGHT ARM  Result Value Ref Range Status   Specimen Description BLOOD RIGHT ARM  Final   Special Requests   Final    BOTTLES DRAWN AEROBIC AND ANAEROBIC Blood Culture results may not be optimal due to an inadequate volume of blood received in culture bottles   Culture   Final    NO GROWTH 5 DAYS Performed at [facility], [address] [place], [place] [address]    Report Status 11/27/2022 FINAL  Final     Patient was seen and examined on the day of discharge and was found to be in stable condition. Time coordinating discharge: 50 minutes including assessment and coordination of care, as well as examination of the patient.   SIGNED:  Noralee Stain, DO [facility] 11/30/2022, 2:14 PM

## 2022-11-30 NOTE — Progress Notes (Signed)
Mobility Specialist Progress Note:    11/30/22 0915  Mobility  Activity Transferred from bed to chair  Level of Assistance Minimal assist, patient does 75% or more  Assistive Device Front wheel walker  Distance Ambulated (ft) 5 ft  Activity Response Tolerated well  Mobility Referral Yes  $Mobility charge 1 Mobility  Mobility Specialist Start Time (ACUTE ONLY) [id number]  Mobility Specialist Stop Time (ACUTE ONLY) 0902  Mobility Specialist Time Calculation (min) (ACUTE ONLY) 10 min   Pt received in bed eager for mobility. Pt needed MinA to stand and contact guard during transfer. Was able to take a couple steps towards the chair w/o fault. Situated in chair w/ call bell and personal belongings in reach. All needs met. Chair alarm on.  Thompson Grayer Mobility Specialist  Please contact vis Secure Chat or  Rehab Office [phone]

## 2022-11-30 NOTE — TOC Progression Note (Addendum)
Transition of Care [facility]) - Progression Note    Patient Details  Name: [name] [name] MRN: [id number] Date of Birth: [birth year]  [facility]) CM/SW Contact  Michaela Corner, [place] Phone Number: 11/30/2022, 12:50 PM  Clinical Narrative:   Bjorn Loser, from [facility], stated they can accept pt over the weekend. Room number is 3221; call to report is [phone] ext [phone].  1:38PM: [facility] reached back out and stated it is in the best interest of the pt to dc Monday to facility if pt is not dc today.    Expected Discharge Plan: Home w Home Health Services Barriers to Discharge: Continued Medical Work up  Expected Discharge Plan and Services In-house Referral: NA Discharge Planning Services: CM Consult   Living arrangements for the past 2 months: Single Family Home                 DME Arranged: N/A         HH Arranged: RN, PT, Social Work HH Agency: [facility] Date HH Agency Contacted: 11/21/22 Time HH Agency Contacted: 1420 Representative spoke with at [facility] Agency: Haywood Lasso   Social Determinants of Health (SDOH) Interventions SDOH Screenings   Food Insecurity: Food Insecurity Present (11/21/2022)  Housing: Patient Unable To Answer (11/22/2022)  Transportation Needs: Patient Unable To Answer (11/22/2022)  Recent Concern: Transportation Needs - Unmet Transportation Needs (11/21/2022)  Utilities: Patient Unable To Answer (11/22/2022)  Alcohol Screen: Low Risk  (10/23/2019)  Depression (PHQ2-9): High Risk (11/21/2022)  Financial Resource Strain: Low Risk  (11/21/2022)  Physical Activity: Inactive (11/21/2022)  Social Connections: Socially Isolated (11/21/2022)  Stress: Stress Concern Present (11/21/2022)  Tobacco Use: Medium Risk (11/26/2022)  Health Literacy: Adequate Health Literacy (11/21/2022)    Readmission Risk Interventions     No data to display

## 2022-12-02 ENCOUNTER — Other Ambulatory Visit: Payer: Self-pay | Admitting: Physician Assistant

## 2022-12-02 DIAGNOSIS — I5033 Acute on chronic diastolic (congestive) heart failure: Secondary | ICD-10-CM | POA: Diagnosis not present

## 2022-12-02 LAB — VITAMIN B1: Vitamin B1 (Thiamine): 246.7 nmol/L — ABNORMAL HIGH (ref 66.5–200.0)

## 2022-12-02 NOTE — Progress Notes (Unsigned)
  Cardiology Office Note:  .   Date:  12/02/2022  ID:  [name] [name], DOB Jan 31, 1955, MRN [id number] PCP: Bary Leriche, PA-C  [facility] Providers Cardiologist:  Reatha Harps, MD { Click to update primary MD,subspecialty MD or APP then REFRESH:1}   History of Present Illness: .   [name] [name] is a 67 y.o. male with history of PAD, CAD s/p CABG, persistent Afib, HFpEF, HTN, HLD who presents for follow-up.      Problem List PAD -R PT stenosis -> balloon 11/2020 -L peroneal artery -> balloon/recurrent stenosis 11/2020 2. CAD s/p CABG -09/30/2001 CABG x 4 3. Persistent Afib  4. DM -A1c 7.8 5. HTN 6. HFpEF 7. HLD -T chol 78, HDL 39, LDL 34, TG 26    ROS: All other ROS reviewed and negative. Pertinent positives noted in the HPI.     Studies Reviewed: Marland Kitchen        TTE 11/21/2022  1. Left ventricular ejection fraction, by estimation, is 55 to 60%. The  left ventricle has normal function. Left ventricular endocardial border  not optimally defined to evaluate regional wall motion. There is mild  concentric left ventricular  hypertrophy. Left ventricular diastolic parameters are indeterminate.   2. Right ventricular systolic function is mildly reduced. The right  ventricular size is mildly enlarged. There is normal pulmonary artery  systolic pressure. The estimated right ventricular systolic pressure is  32.6 mmHg.   3. Left atrial size was mildly dilated.   4. Right atrial size was mildly dilated.   5. The mitral valve is normal in structure. Trivial mitral valve  regurgitation. No evidence of mitral stenosis.   6. The aortic valve is tricuspid. 1.4 x 1 cm nodule on the noncoronary  cusp, concern for vegetation and would suggest TEE evaluation. Aortic  valve regurgitation is not visualized. No aortic stenosis is present.   7. Aortic dilatation noted. There is mild dilatation of the ascending  aorta, measuring 38 mm.   8. The inferior vena cava is  normal in size with <50% respiratory  variability, suggesting right atrial pressure of 8 mmHg.   9. Non-sinus rhythm, ?atrial fibrillation  Physical Exam:   VS:  There were no vitals taken for this visit.   Wt Readings from Last 3 Encounters:  11/30/22 161 lb 6 oz (73.2 kg)  11/21/22 170 lb (77.1 kg)  11/20/22 170 lb 6.4 oz (77.3 kg)    GEN: Well nourished, well developed in no acute distress NECK: No JVD; No carotid bruits CARDIAC: ***RRR, no murmurs, rubs, gallops RESPIRATORY:  Clear to auscultation without rales, wheezing or rhonchi  ABDOMEN: Soft, non-tender, non-distended EXTREMITIES:  No edema; No deformity  ASSESSMENT AND PLAN: .   ***    {Are you ordering a CV Procedure (e.g. stress test, cath, DCCV, TEE, etc)?   Press F2        :[id number]}   Follow-up: No follow-ups on file.  Time Spent with Patient: I have spent a total of *** minutes caring for this patient today face to face, ordering and reviewing labs/tests, reviewing prior records/medical history, examining the patient, establishing an assessment and plan, communicating results/findings to the patient/family, and documenting in the medical record.   Signed, Lenna Gilford. Flora Lipps, MD, [facility]  [facility]  [address] [place], [place] [address] [phone]  8:42 PM

## 2022-12-03 ENCOUNTER — Telehealth: Payer: Self-pay

## 2022-12-03 DIAGNOSIS — I10 Essential (primary) hypertension: Secondary | ICD-10-CM | POA: Diagnosis not present

## 2022-12-03 DIAGNOSIS — R5381 Other malaise: Secondary | ICD-10-CM | POA: Diagnosis not present

## 2022-12-03 DIAGNOSIS — I5030 Unspecified diastolic (congestive) heart failure: Secondary | ICD-10-CM | POA: Diagnosis not present

## 2022-12-03 DIAGNOSIS — I4891 Unspecified atrial fibrillation: Secondary | ICD-10-CM | POA: Diagnosis not present

## 2022-12-03 DIAGNOSIS — E119 Type 2 diabetes mellitus without complications: Secondary | ICD-10-CM | POA: Diagnosis not present

## 2022-12-03 NOTE — Progress Notes (Signed)
   Care Guide Note  12/03/2022 Name: [name] [name] MRN: [id number] DOB: 1955-04-05  Referred by: [name], [name], PA-C Reason for referral : Care Coordination (Outreach to schedule with pharm d )   [name] [name] is a 67 y.o. year old male who is a primary care patient of [name], [name], PA-C. [name] [name] was referred to the pharmacist for assistance related to DM.    An unsuccessful telephone outreach was attempted today to contact the patient who was referred to the pharmacy team for assistance with medication management. Additional attempts will be made to contact the patient.   Penne Lash , RMA     [facility]  [facility], [facility] Guide  Direct Dial: [phone]  Website: [email or web address]

## 2022-12-03 NOTE — Progress Notes (Signed)
   Care Guide Note  12/03/2022 Name: [name] [name] MRN: [id number] DOB: 11-Jun-1955  Referred by: [name], [name], PA-C Reason for referral : Care Coordination (Outreach to schedule with pharm d )   Meet [name] is a 67 y.o. year old male who is a primary care patient of [name], [name], PA-C. [name] was referred to the pharmacist for assistance related to DM.    Successful contact was made with the patient to discuss pharmacy services including being ready for the pharmacist to call at least 5 minutes before the scheduled appointment time, to have medication bottles and any blood sugar or blood pressure readings ready for review. The patient agreed to meet with the pharmacist via with the pharmacist via telephone visit on (date/time).  12/18/2022  Penne Lash , RMA     [place]  [facility], [facility] Guide  Direct Dial: [phone]  Website: [email or web address]

## 2022-12-04 ENCOUNTER — Ambulatory Visit: Payer: HMO | Admitting: Cardiovascular Disease

## 2022-12-04 DIAGNOSIS — R278 Other lack of coordination: Secondary | ICD-10-CM | POA: Diagnosis not present

## 2022-12-04 DIAGNOSIS — I639 Cerebral infarction, unspecified: Secondary | ICD-10-CM | POA: Diagnosis not present

## 2022-12-04 DIAGNOSIS — L22 Diaper dermatitis: Secondary | ICD-10-CM | POA: Diagnosis not present

## 2022-12-04 DIAGNOSIS — I5032 Chronic diastolic (congestive) heart failure: Secondary | ICD-10-CM

## 2022-12-04 DIAGNOSIS — I5033 Acute on chronic diastolic (congestive) heart failure: Secondary | ICD-10-CM | POA: Diagnosis not present

## 2022-12-04 DIAGNOSIS — E1165 Type 2 diabetes mellitus with hyperglycemia: Secondary | ICD-10-CM | POA: Diagnosis not present

## 2022-12-04 DIAGNOSIS — I4819 Other persistent atrial fibrillation: Secondary | ICD-10-CM

## 2022-12-04 DIAGNOSIS — I25810 Atherosclerosis of coronary artery bypass graft(s) without angina pectoris: Secondary | ICD-10-CM

## 2022-12-04 DIAGNOSIS — E782 Mixed hyperlipidemia: Secondary | ICD-10-CM

## 2022-12-04 DIAGNOSIS — I739 Peripheral vascular disease, unspecified: Secondary | ICD-10-CM

## 2022-12-05 ENCOUNTER — Ambulatory Visit: Payer: PPO | Admitting: Podiatry

## 2022-12-06 DIAGNOSIS — I5033 Acute on chronic diastolic (congestive) heart failure: Secondary | ICD-10-CM | POA: Diagnosis not present

## 2022-12-10 DIAGNOSIS — I5033 Acute on chronic diastolic (congestive) heart failure: Secondary | ICD-10-CM | POA: Diagnosis not present

## 2022-12-11 ENCOUNTER — Ambulatory Visit: Payer: PPO | Admitting: Gastroenterology

## 2022-12-12 DIAGNOSIS — I5033 Acute on chronic diastolic (congestive) heart failure: Secondary | ICD-10-CM | POA: Diagnosis not present

## 2022-12-12 DIAGNOSIS — E1165 Type 2 diabetes mellitus with hyperglycemia: Secondary | ICD-10-CM | POA: Diagnosis not present

## 2022-12-12 DIAGNOSIS — L22 Diaper dermatitis: Secondary | ICD-10-CM | POA: Diagnosis not present

## 2022-12-12 DIAGNOSIS — I480 Paroxysmal atrial fibrillation: Secondary | ICD-10-CM | POA: Diagnosis not present

## 2022-12-14 DIAGNOSIS — I5033 Acute on chronic diastolic (congestive) heart failure: Secondary | ICD-10-CM | POA: Diagnosis not present

## 2022-12-17 DIAGNOSIS — I5033 Acute on chronic diastolic (congestive) heart failure: Secondary | ICD-10-CM | POA: Diagnosis not present

## 2022-12-18 ENCOUNTER — Telehealth: Payer: Self-pay

## 2022-12-18 ENCOUNTER — Other Ambulatory Visit: Payer: Self-pay | Admitting: Pharmacist

## 2022-12-18 DIAGNOSIS — L22 Diaper dermatitis: Secondary | ICD-10-CM | POA: Diagnosis not present

## 2022-12-18 DIAGNOSIS — I480 Paroxysmal atrial fibrillation: Secondary | ICD-10-CM | POA: Diagnosis not present

## 2022-12-18 DIAGNOSIS — I5033 Acute on chronic diastolic (congestive) heart failure: Secondary | ICD-10-CM | POA: Diagnosis not present

## 2022-12-18 DIAGNOSIS — E1165 Type 2 diabetes mellitus with hyperglycemia: Secondary | ICD-10-CM | POA: Diagnosis not present

## 2022-12-18 NOTE — Telephone Encounter (Signed)
Patient son would like to know what patient medication management will need to be once he is released from rehab.

## 2022-12-18 NOTE — Telephone Encounter (Signed)
Patient son aware and verbalized understanding. °

## 2022-12-18 NOTE — Progress Notes (Signed)
12/18/2022 Name: [name] [name] MRN: [id number] DOB: 01/02/1955  Chief Complaint  Patient presents with   Medication Management   Medication Adherence    [name] [name] is a 67 y.o. year old male. He is currently in rehab at [facility] but I spoke with his son [name] [name] today to discuss plans for when patient returns home form rehab.    They were referred to the pharmacist by their PCP for assistance in managing diabetes, hypertension, medication access, and complex medication management.    Subjective:  11/20/22 to 11/30/22 - Hospitalized for hypertension emergency. Blood pressure at presentation was 233/102. Also noted have had at least 2 hypoglycemia episodes recently  - one required EMT intervention and other was managed by his son at home but both times patient was found unresponsive. Blood glucose at presentation was 89; He has CHF and was noted at presentation to be fluid overloaded ECHO 55-60; BNP was elevated at 262. Medication changes at discharge:  START taking: hydrOXYzine (ATARAX) linagliptin (Tradjenta) QUEtiapine (SEROQUEL)  STOP taking: Dexcom G7 Sensor Misc furosemide 40 MG tablet (LASIX) insulin aspart 100 UNIT/ML injection (NovoLOG) Omnipod 5 G7 Pods (Gen 5) Misc  [name] [name] son [name] [name] places meds in weekly pill container but he travels with his job and is not able to be present daily to ensure patient takes his medications. The social worker at [facility] has mentioned that [name] [name] might qualify to received 20 hours of in home assistance with bathing, housework and reminder to take medications.   Care Team: Primary Care Provider: [name], [name], PA-C ; Next Scheduled Visit: not currently scheduled. Cardiologist: Dr Flora Lipps; Next Scheduled Visit: 02/08/2023 Endocrinologist Dr Lonzo Cloud; Next Scheduled Visit: 02/14/2023 Neurologist: [name] Penny, NP; Next Scheduled Visit: 01/07/2023 Podiatrist: Dr Stacie Acres: Next Scheduled  Visit: 01/04/2023   Medication Access/Adherence  Current Pharmacy:  [id number] [place], [place] - [address] [place] [address] Phone: [phone] Fax: [phone]   Patient reports affordability concerns with their medications: Yes  - currently in Medicare Coverage gap. Patient reports access/transportation concerns to their pharmacy: No  - son helps with medication pick up Patient reports adherence concerns with their medications:  Yes  - son expresses concern with patient's ability to use Omnipod system and hypoglycemic events.    Diabetes:  Current medications:  Tradgenta 5mg  daily  Medications tried in the past: Novolog with Omnipod pump (stopped at hospital discharge 11/2022); metformin - stopped 12/03/2019 during hospitalization; glipizide - took in 2020 - likely stopped due to insulin therapy; Lantus + Novolog - was taking in 2020; glimepiride - stopped 08/2010 when he was seeing Dr Morrison Old, [facility] end[facility].    Current glucose readings: none available Using DexCom Continuous Glucose Monitoring System  Patient has been noted to have had hypoglyemic events in the last 2 months that required intervention by EMS.  [name] [name] has met with [name] [name] in the last few months and there was mention of possibly changing insulin pump system to the iLet Pancrease which should likely be easier for patient and would use Libre 3 Continuous Glucose Monitor system. Unfortunately [name] [name] was hospitalized before they could start this process.   Current medication access support: none  Hypertension:  Current medications: losartan 50mg  daily, spironolactone 25mg  daily  Medications previously tried:  amlodipine - stopped 11/30/2022 by neurologist Dr [name] [name] due to blood pressure in hospital on the lower end of normal. Dr [name] [name] recommended avoiding hypotension due to past stroke history.  Also not beta blockers due to slow ventricular response and low HR.     Hyperlipidemia/ASCVD Risk Reduction  Current lipid lowering medications: atorvastatin 40mg  daily    Antiplatelet regimen: Eliquis for Afib   Heart Failure - with preserver EF (EF 55-60%):  Current medications:  ACEi/ARB/ARNI: yes  SGLT2i: no Beta blocker: no - due to slow ventricular response / low HR Mineralocorticoid Receptor Antagonist: yes Diuretic regimen: only spironolactone   Current weights: not available   Atrial Fibrillation:  Current medications: Rate Control: none Rhythm Control: none Anticoagulation Regimen: Eliquis 5mg  twice a day  Current medication access support: none   Objective:  Lab Results  Component Value Date   HGBA1C 9.2 (H) 11/30/2022    Lab Results  Component Value Date   CREATININE 1.50 (H) 11/30/2022   BUN 44 (H) 11/30/2022   NA 139 11/30/2022   K 4.9 11/30/2022   CL 102 11/30/2022   CO2 25 11/30/2022    Lab Results  Component Value Date   CHOL 117 11/30/2022   HDL 48 11/30/2022   LDLCALC 52 11/30/2022   LDLDIRECT 37.0 03/16/2020   TRIG 84 11/30/2022   CHOLHDL 2.4 11/30/2022    Medications Reviewed Today     Reviewed by [name] [name], RPH-CPP (Pharmacist) on 12/18/22 at 0933  Med List Status: <None>   Medication Order Taking? Sig Documenting Provider Last Dose Status Informant  acetaminophen (TYLENOL) 500 MG tablet [id number] No Take 500 mg by mouth as needed for mild pain or moderate pain. [provider] Taking Active Child  apixaban (ELIQUIS) 5 MG TABS tablet [id number] No Take 1 tablet (5 mg total) by mouth 2 (two) times daily. Sande Rives, MD Taking Active Child  aspirin EC 81 MG EC tablet [id number] No Take 1 tablet (81 mg total) by mouth daily.  Patient taking differently: Take 81 mg by mouth in the morning.   [name] Cove, MD Taking Active Child  atorvastatin (LIPITOR) 40 MG tablet [id number] No TAKE 1 TABLET BY MOUTH EVERY DAY [name], [name], PA-C Taking Active Child  hydrOXYzine  (ATARAX) 25 MG tablet [id number]  Take 1 tablet (25 mg total) by mouth 3 (three) times daily as needed for anxiety (sleep). Noralee Stain, DO  Active   Incontinence Supply Disposable (DISPOSABLE BRIEF LARGE) MISC [id number] No 1 each by Does not apply route as needed. [name], [name], PA-C Taking Active Child  linagliptin (TRADJENTA) 5 MG TABS tablet [id number]  Take 1 tablet (5 mg total) by mouth daily. Noralee Stain, DO  Active   loperamide (IMODIUM) 2 MG capsule [id number] No Take 2 mg by mouth 4 (four) times daily as needed for diarrhea or loose stools. [provider] Taking Active Child  losartan (COZAAR) 50 MG tablet [id number] No Take 1 tablet (50 mg total) by mouth daily. Narda Bonds, MD Past Week Expired 11/20/22 2359 Child  mirtazapine (REMERON) 15 MG tablet [id number] No TAKE 1 TABLET BY MOUTH EVERYDAY AT BEDTIME [name], [name], PA-C Taking Active Child  Multiple Vitamins-Minerals (MULTIVITAMIN WITH MINERALS) tablet [id number] No Take 1 tablet by mouth daily. [provider] Taking Active Child  PARoxetine (PAXIL) 20 MG tablet [id number] No TAKE 1 TABLET BY MOUTH EVERY DAY  Patient taking differently: Take 20 mg by mouth in the morning.   [name], [name], PA-C Taking Active Child           Med Note (SATTERFIELD, Genoveva Ill   Tue Nov 20, 2022  5:16 PM)  QUEtiapine (SEROQUEL) 25 MG tablet [id number]  Take 1 tablet (25 mg total) by mouth at bedtime. Noralee Stain, DO  Active   spironolactone (ALDACTONE) 25 MG tablet [id number]  TAKE 1 TABLET BY MOUTH EVERY DAY [name], [name], PA-C  Active               Assessment/Plan:   Diabetes:Currently uncontrolled with hypoglycemic episodes and hyperglycemic excursions - per 11/2022 Continuous Glucose Monitor report. There are no blood glucose reports since he has been at [facility] and insulin was stopped.  - Reviewed goal A1c, goal fasting, and goal 2 hour post prandial glucose - may need to adjust goals since  patient is prone to hypoglycemia (FBG 100 to 150, Post prandial < 200 and A1c < 7.5%) - Requested blood glucose records form [facility] - awaiting fax.  - I suspect patient will need some sort of insulin given that he has been on a basal/bolus insulin regimen for several years but will need to see recent blood glucose records. Will defer to endo for recommendations once patient is released from rehab.  - If we are able stabilize blood glucose  and if endo feels would be appropriate might consider SGLT2 for CHF / renal benefits. Would need to monitor HR and Scr.  - If patient remains on Omnipod then should continue with DexCom. If iLet Pancresae if started or no insulin pump is used then could consider change back to [employer] System that patient and son prefer - should be covered by HTA in 2025.    Hypertension / CHF :Currently uncontrolled per last OV but looks like has improved during hospitalization - Recommend to continue losartan 50mg  daily and spironolactone - Consider SGLT2 in future if blood pressure / HR not too low.  - Recommend checking blood pressure and weight at home when he returns form rehab    Hyperlipidemia/ASCVD Risk Reduction: Currently controlled.  - Recommend to continue atorvastatin 40mg  daily    Atrial Fibrillation: Currently controlled - Reviewed importance of adherence to anticoagulant for stroke prevention. - Per son cost of Eliquis is > $150 - Though currently patient is getting from rehab facility. He might receive enough at rehab discharge to last until his Medicare resets 01/02/2023. If not will see if we might be able to get 1 or 2 weeks of samples.  Medication Management: - Currently strategy insufficient to maintain appropriate adherence to prescribed medication regimen - Suggested continue to use weekly pill box to organize medications. Suggested consider weekly pill container that has reminder / alarm system or to set reminders on patient's phone or with  Alexa.  - If patient received in home care in future, hopefully this assistant will be able to remind him to take meds. - Discussed collaboration with local pharmacies for adherence packaging. Reviewed local pharmacies with adherence packaging options. [name]. Velten son states they will try adding alarms first.   -Screening for LIS - patient did not qualify. - Reviewed 2025 HTA plan for diabetes and heart care. Patient should only have cost of about $58/month until around July 2025 and then cost will be $0 until December. Sent his son information from [email or web address]   Will check back in once I have records from [facility] regarding blood glucose and meds.   [name] [name], PharmD Clinical Pharmacist [facility]  Population Health [phone]

## 2022-12-19 DIAGNOSIS — I5033 Acute on chronic diastolic (congestive) heart failure: Secondary | ICD-10-CM | POA: Diagnosis not present

## 2022-12-20 DIAGNOSIS — I480 Paroxysmal atrial fibrillation: Secondary | ICD-10-CM | POA: Diagnosis not present

## 2022-12-20 DIAGNOSIS — R278 Other lack of coordination: Secondary | ICD-10-CM | POA: Diagnosis not present

## 2022-12-20 DIAGNOSIS — I639 Cerebral infarction, unspecified: Secondary | ICD-10-CM | POA: Diagnosis not present

## 2022-12-20 DIAGNOSIS — I5033 Acute on chronic diastolic (congestive) heart failure: Secondary | ICD-10-CM | POA: Diagnosis not present

## 2022-12-20 DIAGNOSIS — E1165 Type 2 diabetes mellitus with hyperglycemia: Secondary | ICD-10-CM | POA: Diagnosis not present

## 2022-12-21 DIAGNOSIS — I5033 Acute on chronic diastolic (congestive) heart failure: Secondary | ICD-10-CM | POA: Diagnosis not present

## 2022-12-22 DIAGNOSIS — I5033 Acute on chronic diastolic (congestive) heart failure: Secondary | ICD-10-CM | POA: Diagnosis not present

## 2022-12-24 DIAGNOSIS — I5033 Acute on chronic diastolic (congestive) heart failure: Secondary | ICD-10-CM | POA: Diagnosis not present

## 2022-12-25 DIAGNOSIS — I5033 Acute on chronic diastolic (congestive) heart failure: Secondary | ICD-10-CM | POA: Diagnosis not present

## 2022-12-25 DIAGNOSIS — E1165 Type 2 diabetes mellitus with hyperglycemia: Secondary | ICD-10-CM | POA: Diagnosis not present

## 2022-12-25 DIAGNOSIS — I480 Paroxysmal atrial fibrillation: Secondary | ICD-10-CM | POA: Diagnosis not present

## 2022-12-25 DIAGNOSIS — L22 Diaper dermatitis: Secondary | ICD-10-CM | POA: Diagnosis not present

## 2022-12-27 ENCOUNTER — Telehealth: Payer: Self-pay | Admitting: *Deleted

## 2022-12-27 DIAGNOSIS — E1151 Type 2 diabetes mellitus with diabetic peripheral angiopathy without gangrene: Secondary | ICD-10-CM | POA: Diagnosis not present

## 2022-12-27 DIAGNOSIS — M1990 Unspecified osteoarthritis, unspecified site: Secondary | ICD-10-CM | POA: Diagnosis not present

## 2022-12-27 DIAGNOSIS — Z9181 History of falling: Secondary | ICD-10-CM | POA: Diagnosis not present

## 2022-12-27 DIAGNOSIS — E785 Hyperlipidemia, unspecified: Secondary | ICD-10-CM | POA: Diagnosis not present

## 2022-12-27 DIAGNOSIS — I480 Paroxysmal atrial fibrillation: Secondary | ICD-10-CM | POA: Diagnosis not present

## 2022-12-27 DIAGNOSIS — I110 Hypertensive heart disease with heart failure: Secondary | ICD-10-CM | POA: Diagnosis not present

## 2022-12-27 DIAGNOSIS — L89151 Pressure ulcer of sacral region, stage 1: Secondary | ICD-10-CM | POA: Diagnosis not present

## 2022-12-27 DIAGNOSIS — N179 Acute kidney failure, unspecified: Secondary | ICD-10-CM | POA: Diagnosis not present

## 2022-12-27 DIAGNOSIS — L89892 Pressure ulcer of other site, stage 2: Secondary | ICD-10-CM | POA: Diagnosis not present

## 2022-12-27 DIAGNOSIS — Z7901 Long term (current) use of anticoagulants: Secondary | ICD-10-CM | POA: Diagnosis not present

## 2022-12-27 DIAGNOSIS — H548 Legal blindness, as defined in USA: Secondary | ICD-10-CM | POA: Diagnosis not present

## 2022-12-27 DIAGNOSIS — I5033 Acute on chronic diastolic (congestive) heart failure: Secondary | ICD-10-CM | POA: Diagnosis not present

## 2022-12-27 DIAGNOSIS — Z8673 Personal history of transient ischemic attack (TIA), and cerebral infarction without residual deficits: Secondary | ICD-10-CM | POA: Diagnosis not present

## 2022-12-27 DIAGNOSIS — Z7982 Long term (current) use of aspirin: Secondary | ICD-10-CM | POA: Diagnosis not present

## 2022-12-27 DIAGNOSIS — Z7984 Long term (current) use of oral hypoglycemic drugs: Secondary | ICD-10-CM | POA: Diagnosis not present

## 2022-12-27 DIAGNOSIS — I2510 Atherosclerotic heart disease of native coronary artery without angina pectoris: Secondary | ICD-10-CM | POA: Diagnosis not present

## 2022-12-27 DIAGNOSIS — I160 Hypertensive urgency: Secondary | ICD-10-CM | POA: Diagnosis not present

## 2022-12-27 DIAGNOSIS — E11649 Type 2 diabetes mellitus with hypoglycemia without coma: Secondary | ICD-10-CM | POA: Diagnosis not present

## 2022-12-27 DIAGNOSIS — Z951 Presence of aortocoronary bypass graft: Secondary | ICD-10-CM | POA: Diagnosis not present

## 2022-12-27 DIAGNOSIS — F32A Depression, unspecified: Secondary | ICD-10-CM | POA: Diagnosis not present

## 2022-12-27 NOTE — Telephone Encounter (Signed)
PCP has a 15 minute opening on 12/27 @ 11 am. Okay to place patient if able? If not, can this be with another provider?

## 2022-12-27 NOTE — Telephone Encounter (Signed)
Copied from CRM [id number]. Topic: Clinical - Request for Lab/Test Order >> Dec 27, 2022  1:40 PM Samuel Jester B wrote: Reason for CRM: Eleatha the Rn of the patient called in and stated that the pt blood pressure is elevated 188/98, and would also like to request an order to obtain a UA due to the pt having symptoms of a UTI. Callback number will be [phone]   Patient need an office visit  Please schedule, thanks

## 2022-12-28 ENCOUNTER — Ambulatory Visit (INDEPENDENT_AMBULATORY_CARE_PROVIDER_SITE_OTHER): Payer: PPO | Admitting: Physician Assistant

## 2022-12-28 ENCOUNTER — Encounter: Payer: Self-pay | Admitting: Physician Assistant

## 2022-12-28 VITALS — BP 166/92 | HR 60 | Temp 97.1°F | Ht 74.0 in | Wt 151.2 lb

## 2022-12-28 DIAGNOSIS — R3911 Hesitancy of micturition: Secondary | ICD-10-CM | POA: Diagnosis not present

## 2022-12-28 DIAGNOSIS — S91302A Unspecified open wound, left foot, initial encounter: Secondary | ICD-10-CM | POA: Diagnosis not present

## 2022-12-28 DIAGNOSIS — I10 Essential (primary) hypertension: Secondary | ICD-10-CM

## 2022-12-28 DIAGNOSIS — R443 Hallucinations, unspecified: Secondary | ICD-10-CM

## 2022-12-28 LAB — COMPREHENSIVE METABOLIC PANEL
ALT: 15 U/L (ref 0–53)
AST: 21 U/L (ref 0–37)
Albumin: 3.5 g/dL (ref 3.5–5.2)
Alkaline Phosphatase: 164 U/L — ABNORMAL HIGH (ref 39–117)
BUN: 34 mg/dL — ABNORMAL HIGH (ref 6–23)
CO2: 31 meq/L (ref 19–32)
Calcium: 8.8 mg/dL (ref 8.4–10.5)
Chloride: 99 meq/L (ref 96–112)
Creatinine, Ser: 0.97 mg/dL (ref 0.40–1.50)
GFR: 79.43 mL/min (ref >60.00)
Glucose, Bld: 204 mg/dL — ABNORMAL HIGH (ref 70–99)
Potassium: 5.4 meq/L — ABNORMAL HIGH (ref 3.5–5.1)
Sodium: 138 meq/L (ref 135–145)
Total Bilirubin: 0.5 mg/dL (ref 0.2–1.2)
Total Protein: 6.6 g/dL (ref 6.0–8.3)

## 2022-12-28 LAB — CBC WITH DIFFERENTIAL/PLATELET
Basophils Absolute: 0.1 10*3/uL (ref 0.0–0.1)
Basophils Relative: 0.8 % (ref 0.0–3.0)
Eosinophils Absolute: 0.1 10*3/uL (ref 0.0–0.7)
Eosinophils Relative: 1.3 % (ref 0.0–5.0)
HCT: 35.1 % — ABNORMAL LOW (ref 39.0–52.0)
Hemoglobin: 11.6 g/dL — ABNORMAL LOW (ref 13.0–17.0)
Lymphocytes Relative: 13.5 % (ref 12.0–46.0)
Lymphs Abs: 1.2 10*3/uL (ref 0.7–4.0)
MCHC: 33.2 g/dL (ref 30.0–36.0)
MCV: 91.5 fL (ref 78.0–100.0)
Monocytes Absolute: 0.5 10*3/uL (ref 0.1–1.0)
Monocytes Relative: 5.5 % (ref 3.0–12.0)
Neutro Abs: 7.2 10*3/uL (ref 1.4–7.7)
Neutrophils Relative %: 78.9 % — ABNORMAL HIGH (ref 43.0–77.0)
Platelets: 261 10*3/uL (ref 150.0–400.0)
RBC: 3.84 Mil/uL — ABNORMAL LOW (ref 4.22–5.81)
RDW: 15.4 % (ref 11.5–15.5)
WBC: 9.1 10*3/uL (ref 4.0–10.5)

## 2022-12-28 LAB — PHOSPHORUS: Phosphorus: 2.7 mg/dL (ref 2.3–4.6)

## 2022-12-28 LAB — MAGNESIUM: Magnesium: 1.5 mg/dL (ref 1.5–2.5)

## 2022-12-28 NOTE — Telephone Encounter (Signed)
Flag for extra time added to pts chart

## 2022-12-28 NOTE — Patient Instructions (Addendum)
IF YOUR SYMPTOMS SUDDENLY WORSE OR CHANGE SUCH AS INABILITY TO URINATE, CHEST PAIN, DIZZINESS, SHORT OF BREATH, ETC, VERY VERY LOW THRESHOLD TO RETURN TO THE EMERGENCY DEPT THIS WEEKEND.  VISIT SUMMARY:  During today's visit, we discussed your recent visual hallucinations, blood pressure, foot wound, and overall medication management. We also reviewed your current health status and made plans for further evaluation and care.  YOUR PLAN:  -POSSIBLE URINARY TRACT INFECTION: A urinary tract infection (UTI) is an infection in any part of your urinary system. You have been experiencing visual hallucinations, which can sometimes be associated with UTIs. We attempted to collect a urine sample today but were unable to do so. Home health will collect a urine sample for testing on Monday, 12/31/2022.  -HYPERTENSION: Hypertension is high blood pressure. Your blood pressure is elevated but not as high as during your previous hospital admission. Please continue your current blood pressure medications. Home health will check your blood pressure during their visit on 12/31/2022.  -FOOT WOUND: You have a small wound on your left foot that is being managed by home health. There is no pain reported. Please continue with the scheduled wound care.  -MEDICATION MANAGEMENT: You have been recently discharged from a rehabilitation center and your son is currently helping you manage your medications. Please continue with your current medication regimen with your son's assistance. We may consider additional support for medication management if your son is unavailable.  -DELIRIUM: Delirium is a sudden change in mental status that can cause confusion and hallucinations. You have a history of delirium and are currently experiencing confusion and hallucinations. We will monitor your mental status during home health visits and consider further evaluation if your symptoms persist or worsen.  INSTRUCTIONS:  Home health will  collect a urine sample for urinalysis and culture on Monday, 12/31/2022. They will also check your blood pressure and monitor your mental status during their visit. Please continue with your current medication regimen and scheduled wound care. If your symptoms persist or worsen, further evaluation may be necessary.

## 2022-12-28 NOTE — Telephone Encounter (Signed)
See previews note 

## 2022-12-28 NOTE — Progress Notes (Signed)
Patient ID: [name] [name], male    DOB: [birth year], 67 y.o.   MRN: [id number]   Assessment & Plan:  Urinary hesitancy -     Urine Culture -     POCT urinalysis dipstick -     CBC with Differential/Platelet -     Comprehensive metabolic panel -     Magnesium -     Phosphorus  Hallucinations -     CBC with Differential/Platelet -     Comprehensive metabolic panel -     Magnesium -     Phosphorus  Essential hypertension -     CBC with Differential/Platelet -     Comprehensive metabolic panel -     Magnesium -     Phosphorus  Wound of left foot    Assessment and Plan    Possible Urinary Tract Infection Reports visual hallucinations for the past two days, similar to previous episodes associated with UTI. No dysuria or increased frequency reported. -Attempted to collect urine sample in office, but patient was unable to provide. -Plan for home health to collect urine sample for urinalysis and culture on Monday 12/31/2022.  Hypertension Blood pressure elevated, but not as high as previous hospital admission. Patient has been taking medications since discharge. -Continue current antihypertensive regimen. -Check blood pressure during home health visit on 12/31/2022.  Foot Wound Small wound on left foot, no pain reported. Home health scheduled for wound care. -Continue with scheduled wound care.  Medication Management Patient recently discharged from rehabilitation center, currently has son assisting with medication management. -Continue current medication regimen with assistance from son.   Delirium Reports of confusion and hallucinations. History of delirium during recent hospital stay. -Monitor mental status during home health visits.      I was able to call and connect with Joretta Bachelor, the RN from [facility], at phone number [phone], after my visit with the patient. I informed her of BP reading, symptoms, and progress today. We couldn't get urine sample today,  but pt assures me he has been urinating throughout the day today. His neuro-status is at his baseline. No acute focal neuro deficits or other immediate concerns or changes noted on exam today. Appreciate the [facility] team calling to check with him tomorrow how he is doing (BP and symptom update as it is a weekend). As well, she will be putting in for urine sample collection on Monday. Both agree very high risk patient with concerning living and medical situation. I do not feel that he is safe to manage his medication on his own by any means. We will continue to work together and with son on proper arrangements for the patient.  Very low threshold for ER, pt and son agreeable.     Return in about 3 days (around 12/31/2022) for recheck/follow-up - MD preferred .    Subjective:    Chief Complaint  Patient presents with   Urinary Tract Infection    HPI  Discussed the use of AI scribe software for clinical note transcription with the patient, who gave verbal consent to proceed. Here with his son today.  History of Present Illness   [name] [name], a patient with a history of high blood pressure and diabetes, presents with visual hallucinations for the past two days. The hallucinations are recognized as such by the patient, unlike a previous episode associated with a UTI when he believed the hallucinations were real. The patient denies any headache, trouble breathing, chest or back pain, stomach pain, vomiting,  or changes in his condition over the past 24 hours. He reports frequent urination, which he attributes to his medication, and chronic diarrhea. He also has a wound on his left foot, which is being managed by [facility] The patient's son, who has been assisting with medication management, reports that the patient has been adherent to his medication regimen since his discharge from the rehabilitation center.        Past Medical History:  Diagnosis Date   Anxiety    Arthritis     Cataract    CHF (congestive heart failure) (HCC)    Constipation    pt states goes EOD- hard stools    Coronary artery disease    Depression    Diabetes mellitus without complication (HCC)    Diabetic retinopathy (HCC)    DKA (diabetic ketoacidoses)    Hyperlipidemia    Hypertension    off meds   Hypertensive retinopathy    Non-intractable vomiting without nausea 06/10/2018   Postherpetic neuralgia 08/11/2021   Speculative diagnosis   Psoriasis    Tubular adenoma of colon 03/2019    Past Surgical History:  Procedure Laterality Date   ABDOMINAL AORTOGRAM W/LOWER EXTREMITY N/A 08/30/2020   Procedure: ABDOMINAL AORTOGRAM W/LOWER EXTREMITY;  Surgeon: Nada Libman, MD;  Location: [facility] INVASIVE CV LAB;  Service: Cardiovascular;  Laterality: N/A;   ABDOMINAL AORTOGRAM W/LOWER EXTREMITY N/A 11/15/2020   Procedure: ABDOMINAL AORTOGRAM W/LOWER EXTREMITY;  Surgeon: Nada Libman, MD;  Location: [facility] INVASIVE CV LAB;  Service: Cardiovascular;  Laterality: N/A;   ANKLE SURGERY     right   CARDIAC CATHETERIZATION  09/25/2001   COLONOSCOPY     ~10 yr ago- normal  per pt    CORONARY ARTERY BYPASS GRAFT  09/30/2001   CABG x 4   IR THORACENTESIS ASP PLEURAL SPACE W/IMG GUIDE  05/19/2020   LAPAROSCOPIC INGUINAL HERNIA REPAIR Bilateral 02/09/2010   MASS EXCISION Left 03/12/2013   Procedure: LEFT INDEX EXCISION MASS AND DEBRIDMENT DISTAL INTERPHALANGEAL JOINT;  Surgeon: Tami Ribas, MD;  Location: [facility];  Service: Orthopedics;  Laterality: Left;   PERIPHERAL VASCULAR BALLOON ANGIOPLASTY Left 08/30/2020   Procedure: PERIPHERAL VASCULAR BALLOON ANGIOPLASTY;  Surgeon: Nada Libman, MD;  Location: [facility]VAS[facility] CV LAB;  Service: Cardiovascular;  Laterality: Left;  Peroneal   TRANSESOPHAGEAL ECHOCARDIOGRAM (CATH LAB) N/A 11/26/2022   Procedure: TRANSESOPHAGEAL ECHOCARDIOGRAM;  Surgeon: [name], MD;  Location: [facility] INVASIVE CV LAB;  Service: Cardiovascular;  Laterality:  N/A;    Family History  Problem Relation Age of Onset   Lymphoma Father    Cancer Paternal Grandmother        Colon Cancer   Colon cancer Paternal Grandmother    Colon polyps Neg Hx    Esophageal cancer Neg Hx    Rectal cancer Neg Hx    Stomach cancer Neg Hx    Diabetes Mellitus I Neg Hx    Pancreatic cancer Neg Hx     Social History   Tobacco Use   Smoking status: Former    Types: Cigars    Quit date: 2022    Years since quitting: 2.9   Smokeless tobacco: Never  Vaping Use   Vaping status: Never Used  Substance Use Topics   Alcohol use: Yes    Comment: about once a year   Drug use: No     Allergies  Allergen Reactions   Wool Alcohol [Lanolin] Hives and Itching    Wool fabric  Review of Systems NEGATIVE UNLESS OTHERWISE INDICATED IN HPI      Objective:     BP (!) 166/92   Pulse 60   Temp (!) 97.1 F (36.2 C) (Temporal)   Ht 6\' 2"  (1.88 m)   Wt 151 lb 3.2 oz (68.6 kg)   SpO2 99%   BMI 19.41 kg/m   Wt Readings from Last 3 Encounters:  12/28/22 151 lb 3.2 oz (68.6 kg)  11/30/22 161 lb 6 oz (73.2 kg)  11/21/22 170 lb (77.1 kg)    BP Readings from Last 3 Encounters:  12/28/22 (!) 166/92  11/30/22 120/62  11/20/22 (!) 210/108     Physical Exam Vitals and nursing note reviewed.  Constitutional:      General: He is not in acute distress.    Appearance: Normal appearance. He is not ill-appearing.     Comments: Wearing winter gloves  Using rollator walker, unsteady in general  Poor hygiene overall   HENT:     Right Ear: Tympanic membrane normal.     Left Ear: Tympanic membrane normal.     Mouth/Throat:     Mouth: Mucous membranes are dry.  Eyes:     Extraocular Movements: Extraocular movements intact.     Conjunctiva/sclera: Conjunctivae normal.     Pupils: Pupils are equal, round, and reactive to light.  Cardiovascular:     Rate and Rhythm: Normal rate and regular rhythm.     Heart sounds: No murmur heard. Pulmonary:     Effort:  Pulmonary effort is normal.     Breath sounds: Normal breath sounds. No wheezing.  Musculoskeletal:     Right lower leg: No edema.     Left lower leg: No edema.  Skin:    Findings: Lesion (left lateral pinky toe there is an approx 0.5 cm oval healed shallow wound. no surrounding erythema.) present.  Neurological:     Mental Status: He is alert and oriented to person, place, and time.     Cranial Nerves: No cranial nerve deficit.     Motor: Weakness (generalized, unsteady) present.  Psychiatric:        Mood and Affect: Mood is depressed. Affect is flat.           Time Spent: 45 minutes of total time was spent on the date of the encounter performing the following actions: chart review prior to seeing the patient, obtaining history, performing a medically necessary exam, counseling on the treatment plan, placing orders, and documenting in our EHR.       Ryland Smoots M Maryjo Ragon, PA-C

## 2022-12-31 ENCOUNTER — Other Ambulatory Visit: Payer: PPO

## 2022-12-31 ENCOUNTER — Other Ambulatory Visit: Payer: Self-pay

## 2022-12-31 DIAGNOSIS — R3911 Hesitancy of micturition: Secondary | ICD-10-CM

## 2022-12-31 LAB — POC URINALSYSI DIPSTICK (AUTOMATED)
Bilirubin, UA: NEGATIVE
Blood, UA: POSITIVE
Glucose, UA: NEGATIVE
Ketones, UA: NEGATIVE
Leukocytes, UA: NEGATIVE
Nitrite, UA: NEGATIVE
Protein, UA: POSITIVE — AB
Spec Grav, UA: 1.015 (ref 1.010–1.025)
Urobilinogen, UA: 1 U/dL
pH, UA: 7 (ref 5.0–8.0)

## 2022-12-31 NOTE — Addendum Note (Signed)
Addended by: Royann Shivers on: 12/31/2022 12:21 PM   Modules accepted: Orders

## 2023-01-01 ENCOUNTER — Telehealth: Payer: Self-pay | Admitting: *Deleted

## 2023-01-01 ENCOUNTER — Encounter: Payer: Self-pay | Admitting: Family Medicine

## 2023-01-01 ENCOUNTER — Ambulatory Visit: Payer: PPO | Admitting: Family Medicine

## 2023-01-01 VITALS — BP 140/70 | HR 71 | Temp 97.5°F | Resp 18 | Ht 74.0 in | Wt 148.5 lb

## 2023-01-01 DIAGNOSIS — K529 Noninfective gastroenteritis and colitis, unspecified: Secondary | ICD-10-CM | POA: Diagnosis not present

## 2023-01-01 DIAGNOSIS — R3129 Other microscopic hematuria: Secondary | ICD-10-CM

## 2023-01-01 DIAGNOSIS — I10 Essential (primary) hypertension: Secondary | ICD-10-CM

## 2023-01-01 DIAGNOSIS — E43 Unspecified severe protein-calorie malnutrition: Secondary | ICD-10-CM | POA: Diagnosis not present

## 2023-01-01 DIAGNOSIS — R443 Hallucinations, unspecified: Secondary | ICD-10-CM

## 2023-01-01 DIAGNOSIS — Z7984 Long term (current) use of oral hypoglycemic drugs: Secondary | ICD-10-CM | POA: Diagnosis not present

## 2023-01-01 DIAGNOSIS — E1165 Type 2 diabetes mellitus with hyperglycemia: Secondary | ICD-10-CM

## 2023-01-01 DIAGNOSIS — Z789 Other specified health status: Secondary | ICD-10-CM | POA: Diagnosis not present

## 2023-01-01 MED ORDER — LOPERAMIDE HCL 2 MG PO CAPS: 2.0000 mg | ORAL_CAPSULE | Freq: Four times a day (QID) | ORAL | 1 refills | Status: DC | PRN

## 2023-01-01 NOTE — Progress Notes (Signed)
 Complex Care Management Note  Care Guide Note 01/01/2023 Name: [name] [name] MRN: [id number] DOB: February 16, 1955  [name] is a 66 y.o. year old male who sees [name], Mardy HERO, PA-C for primary care. I reached out to [employer] by phone today to offer complex care management services.  [name] was given information about Complex Care Management services today including:   The Complex Care Management services include support from the care team which includes your Nurse Coordinator, Clinical Social Worker, or Pharmacist.  The Complex Care Management team is here to help remove barriers to the health concerns and goals most important to you. Complex Care Management services are voluntary, and the patient may decline or stop services at any time by request to their care team member.   Complex Care Management Consent Status: Patient agreed to services and verbal consent obtained.   Follow up plan:  Telephone appointment with complex care management team member scheduled for:  01/03/2023  Encounter Outcome:  Patient Scheduled  Thedford Franks, [facility] Care Coordination Care Guide Direct Dial : [phone]

## 2023-01-01 NOTE — Progress Notes (Signed)
 Subjective:     Patient ID: [name] [name], male    DOB: 09/04/1955, 67 y.o.   MRN: [id number]  Chief Complaint  Patient presents with   Follow-up    3 day follow-up on blood pressure    HPI-here w/son [name] Here for f/u HTN Elevated bp-pt seen 3 days ago by PCP for urianry hesitancy and hallucinations.  Bp's elevated so sch f/u today. SABRA   Bp-doesn't have monitor at home to check bp's.  No ha/dizziness/cp/sob/edema.   Does see spider webs in eyes.  And can see Donkeys(tiny ones in vision).  H/o injections in eyes.  Was to get cataracts done 2 mo ago but then hosp for elevated bp  DM-has dexcom  and omnipod insulin  pod and a lot of lows, so on januvia  and metformin and off the integrated insulin  pod per hospitalization recommendation.  Sugars 100-200.  One day this weekend was 270.   Not on tradjenta  but has some.  Pt having hard time for many years managing DM  not eating properly, mult highs and mult lows.    [facility] home care-pt/ot/wound care. Pt not taking care of self.  Poor oral intake, compliance issues w/meds, visual impairments hinder as well.  Son travels a lot for work  Bm watery diarrhea-usu in sleep and wakes up in stool. Missed GI appt d/t hosp.  CHF-h/o.      Health Maintenance Due  Topic Date Due   Diabetic kidney evaluation - Urine ACR  03/16/2021   Colonoscopy  03/29/2021    Past Medical History:  Diagnosis Date   Anxiety    Arthritis    Cataract    CHF (congestive heart failure) (HCC)    Constipation    pt states goes EOD- hard stools    Coronary artery disease    Depression    Diabetes mellitus without complication (HCC)    Diabetic retinopathy (HCC)    DKA (diabetic ketoacidoses)    Hyperlipidemia    Hypertension    off meds   Hypertensive retinopathy    Non-intractable vomiting without nausea 06/10/2018   Postherpetic neuralgia 08/11/2021   Speculative diagnosis   Psoriasis    Tubular adenoma of colon 03/2019    Past Surgical  History:  Procedure Laterality Date   ABDOMINAL AORTOGRAM W/LOWER EXTREMITY N/A 08/30/2020   Procedure: ABDOMINAL AORTOGRAM W/LOWER EXTREMITY;  Surgeon: Serene Gaile ORN, MD;  Location: [facility];  Service: Cardiovascular;  Laterality: N/A;   ABDOMINAL AORTOGRAM W/LOWER EXTREMITY N/A 11/15/2020   Procedure: ABDOMINAL AORTOGRAM W/LOWER EXTREMITY;  Surgeon: Serene Gaile ORN, MD;  Location: [facility];  Service: Cardiovascular;  Laterality: N/A;   ANKLE SURGERY     right   CARDIAC CATHETERIZATION  09/25/2001   COLONOSCOPY     ~10 yr ago- normal  per pt    CORONARY ARTERY BYPASS GRAFT  09/30/2001   CABG x 4   IR THORACENTESIS ASP PLEURAL SPACE W/IMG GUIDE  05/19/2020   LAPAROSCOPIC INGUINAL HERNIA REPAIR Bilateral 02/09/2010   MASS EXCISION Left 03/12/2013   Procedure: LEFT INDEX EXCISION MASS AND DEBRIDMENT DISTAL INTERPHALANGEAL JOINT;  Surgeon: Franky JONELLE Curia, MD;  Location: [facility];  Service: Orthopedics;  Laterality: Left;   PERIPHERAL VASCULAR BALLOON ANGIOPLASTY Left 08/30/2020   Procedure: PERIPHERAL VASCULAR BALLOON ANGIOPLASTY;  Surgeon: Serene Gaile ORN, MD;  Location: [facility];  Service: Cardiovascular;  Laterality: Left;  Peroneal   TRANSESOPHAGEAL ECHOCARDIOGRAM (CATH LAB) N/A 11/26/2022   Procedure:  TRANSESOPHAGEAL ECHOCARDIOGRAM;  Surgeon: Kate Lonni CROME, MD;  Location: [facility];  Service: Cardiovascular;  Laterality: N/A;     Current Outpatient Medications:    acetaminophen  (TYLENOL ) 500 MG tablet, Take 500 mg by mouth as needed for mild pain or moderate pain., Disp: , Rfl:    apixaban  (ELIQUIS ) 5 MG TABS tablet, Take 1 tablet (5 mg total) by mouth 2 (two) times daily., Disp: 180 tablet, Rfl: 1   aspirin  EC 81 MG EC tablet, Take 1 tablet (81 mg total) by mouth daily. (Patient taking differently: Take 81 mg by mouth in the morning.), Disp: 30 tablet, Rfl: 0   atorvastatin  (LIPITOR) 40 MG tablet, TAKE 1 TABLET BY MOUTH  EVERY DAY, Disp: 90 tablet, Rfl: 1   hydrOXYzine  (ATARAX ) 25 MG tablet, Take 1 tablet (25 mg total) by mouth 3 (three) times daily as needed for anxiety (sleep)., Disp: 30 tablet, Rfl: 0   Incontinence Supply Disposable (DISPOSABLE BRIEF LARGE) MISC, 1 each by Does not apply route as needed., Disp: 36 each, Rfl: 5   JANUVIA  50 MG tablet, Take 50 mg by mouth daily., Disp: , Rfl:    linagliptin  (TRADJENTA ) 5 MG TABS tablet, Take 1 tablet (5 mg total) by mouth daily., Disp: 30 tablet, Rfl: 0   metFORMIN (GLUCOPHAGE-XR) 500 MG 24 hr tablet, Take 500 mg by mouth daily., Disp: , Rfl:    mirtazapine  (REMERON ) 15 MG tablet, TAKE 1 TABLET BY MOUTH EVERYDAY AT BEDTIME, Disp: 90 tablet, Rfl: 1   Multiple Vitamins-Minerals (MULTIVITAMIN WITH MINERALS) tablet, Take 1 tablet by mouth daily., Disp: , Rfl:    PARoxetine  (PAXIL ) 20 MG tablet, TAKE 1 TABLET BY MOUTH EVERY DAY (Patient taking differently: Take 20 mg by mouth in the morning.), Disp: 90 tablet, Rfl: 1   QUEtiapine  (SEROQUEL ) 25 MG tablet, Take 1 tablet (25 mg total) by mouth at bedtime., Disp: 30 tablet, Rfl: 0   spironolactone  (ALDACTONE ) 25 MG tablet, TAKE 1 TABLET BY MOUTH EVERY DAY, Disp: 90 tablet, Rfl: 1   loperamide  (IMODIUM ) 2 MG capsule, Take 1 capsule (2 mg total) by mouth 4 (four) times daily as needed for diarrhea or loose stools., Disp: 30 capsule, Rfl: 1   losartan  (COZAAR ) 50 MG tablet, Take 1 tablet (50 mg total) by mouth daily., Disp: 30 tablet, Rfl: 2  Allergies  Allergen Reactions   Wool Alcohol [Lanolin] Hives and Itching    Wool fabric   ROS neg/noncontributory except as noted HPI/below      Objective:     BP (!) 140/70   Pulse 71   Temp (!) 97.5 F (36.4 C) (Temporal)   Resp 18   Ht 6' 2 (1.88 m)   Wt 148 lb 8 oz (67.4 kg)   SpO2 97%   BMI 19.07 kg/m  Wt Readings from Last 3 Encounters:  01/01/23 148 lb 8 oz (67.4 kg)  12/28/22 151 lb 3.2 oz (68.6 kg)  11/30/22 161 lb 6 oz (73.2 kg)    Physical Exam    Gen: thin, malodorous HEENT: NCAT, conjunctiva not injected, sclera nonicteric NECK:  supple, no thyromegaly, no nodes, no carotid bruits CARDIAC: RRR, S1S2+, no murmur. DP nonpalp+B.  Feet cool LUNGS: CTAB. No wheezes.  Decreased breath sounds at bases ABDOMEN:  BS+, soft, NTND, No HSM, no masses EXT:  no edema.  Wearing winter gloves MSK: walker.  NEURO: A&O x3.  CN II-XII intact.  PSYCH: flat affect Good eye contact  Apent >1 hr reviewing chart,  discussing referrals w/son.  Need for more resources, follow-ups, etc.      Assessment & Plan:  Primary hypertension  Other microscopic hematuria -     Ambulatory referral to Urology  Uncontrolled type 2 diabetes mellitus with hyperglycemia (HCC) -     AMB Referral VBCI Care Management  Chronic diarrhea  Hallucinations  Protein-calorie malnutrition, severe  Need for community resource  Long term current use of oral hypoglycemic drug  Other orders -     Loperamide  HCl; Take 1 capsule (2 mg total) by mouth 4 (four) times daily as needed for diarrhea or loose stools.  Dispense: 30 capsule; Refill: 1   HTN-some better control.  Was admitted for htn emergency.  Continue losartan  50mg  and spironolactone  25mg . Uncontrolled type 2 dm-was on omnipod, but too many falls from too many lows so d/c'd in hosp.  Pt not eating well.  On metformin 500mg  daily and januvia  50mg .  Ranging 100-200.  Continue to monitor..  send sugars on Monday.  Hesitant to increase metformin as chronic diarrhea   concerned with SGLT-2 d/t h/o uti and some bph but consider.  Avoid SU.   Needs to f/u endo Microscopic hematuria-poss bph-refer urol Chronic diarrhea-needs to resch w/GI Hallucinations-? Psych,?CVA,depression, poor vision, other.  H/o CVA.  Needs to get neuro sch Need for community resources-pt cannot care for self any more and family not able to be present for amount of care needed.  Stat referral for resources, placement done      Return in about 4  weeks (around 01/29/2023) for chronic follow-up-w/Alyssaa.  Jenkins CHRISTELLA Carrel, MD

## 2023-01-01 NOTE — Patient Instructions (Addendum)
Dr. Carmie Kanner  [facility] [address] [place], [place], [place] [address] [phone]

## 2023-01-01 NOTE — Progress Notes (Signed)
 Complex Care Management Note Care Guide Note  01/01/2023 Name: [name] [name] MRN: [id number] DOB: July 04, 1955   Complex Care Management Outreach Attempts: An unsuccessful telephone outreach was attempted today to offer the patient information about available complex care management services.  Follow Up Plan:  Additional outreach attempts will be made to offer the patient complex care management information and services.   Encounter Outcome:  No Answer  Thedford Franks, [facility] Care Coordination Care Guide Direct Dial : [phone]

## 2023-01-03 ENCOUNTER — Telehealth: Payer: Self-pay | Admitting: Physician Assistant

## 2023-01-03 ENCOUNTER — Telehealth: Payer: Self-pay

## 2023-01-03 ENCOUNTER — Other Ambulatory Visit: Payer: Self-pay | Admitting: Physician Assistant

## 2023-01-03 ENCOUNTER — Ambulatory Visit: Payer: Self-pay | Admitting: Licensed Clinical Social Worker

## 2023-01-03 LAB — URINE CULTURE
MICRO NUMBER:: 15900069
SPECIMEN QUALITY:: ADEQUATE

## 2023-01-03 MED ORDER — AMPICILLIN 500 MG PO CAPS: 500.0000 mg | ORAL_CAPSULE | Freq: Four times a day (QID) | ORAL | 0 refills | Status: AC

## 2023-01-03 NOTE — Telephone Encounter (Signed)
 Forms received but nothing to complete. States services are approved, PCP reviewed and scanned to patient chart. According to forms pt approved for skilled nursing, OT and PT at home with [facility].

## 2023-01-03 NOTE — Telephone Encounter (Signed)
 Copied from CRM [id number]. Topic: General - Other >> Jan 01, 2023  3:37 PM Corin V wrote: Reason for CRM: Leshana from [employer] is trying to get custodial care benefits started for patient and is faxing forms to office. Please have PCP complete and fax to [facility] upon completion. Instructions will be included. Please call Sharyle if no fax received by 01/03/23

## 2023-01-03 NOTE — Telephone Encounter (Signed)
 Copied from CRM [id number]. Topic: Clinical - Home Health Verbal Orders >> Jan 01, 2023  2:48 PM Leila C wrote: Caller/Agency: Eleanor from [facility] [phone] and can leave a message on secure confidential line, needs   verbal order to move patient's OT to the next week.  Returned Melissa from [name] call with verbal ok to do patient OT next week, advised cb number if needed.

## 2023-01-04 ENCOUNTER — Telehealth: Payer: Self-pay | Admitting: Physician Assistant

## 2023-01-04 ENCOUNTER — Encounter: Payer: Self-pay | Admitting: Podiatry

## 2023-01-04 ENCOUNTER — Ambulatory Visit (INDEPENDENT_AMBULATORY_CARE_PROVIDER_SITE_OTHER): Payer: PPO | Admitting: Podiatry

## 2023-01-04 VITALS — Ht 74.0 in | Wt 148.5 lb

## 2023-01-04 DIAGNOSIS — B351 Tinea unguium: Secondary | ICD-10-CM | POA: Diagnosis not present

## 2023-01-04 DIAGNOSIS — I739 Peripheral vascular disease, unspecified: Secondary | ICD-10-CM

## 2023-01-04 DIAGNOSIS — M79675 Pain in left toe(s): Secondary | ICD-10-CM | POA: Diagnosis not present

## 2023-01-04 DIAGNOSIS — E1142 Type 2 diabetes mellitus with diabetic polyneuropathy: Secondary | ICD-10-CM | POA: Diagnosis not present

## 2023-01-04 DIAGNOSIS — M79674 Pain in right toe(s): Secondary | ICD-10-CM

## 2023-01-04 NOTE — Telephone Encounter (Unsigned)
 Copied from CRM [id number]. Topic: Clinical - Medical Advice >> Jan 04, 2023 10:15 AM Cynthia K wrote: Reason for CRM: [facility] will refax the physician's order for a UA and the plan of care and re-evaluation for OT therapy for Mr. Tomassi. [facility] had the incorrect doctor on the order and will correct and fax back for Dr. Kathrene to resign.

## 2023-01-04 NOTE — Telephone Encounter (Signed)
 FYI: All paperwork that was signed yesterday is now being resent to office via fax to resign due to having Dr Jon Billings name and not PCP.

## 2023-01-04 NOTE — Patient Outreach (Signed)
  Care Coordination   Initial Visit Note   01/03/2023 Name: [name] [name] MRN: [id number] DOB: March 20, 1955  [name] [name] is a 68 y.o. year old male who sees [name], Mardy HERO, PA-C for primary care. I spoke with  Ethel Jama Forquer's son by phone today.  What matters to the patients health and wellness today?  Symptom Management, Caregiver Stress, Level of Care    Goals Addressed             This Visit's Progress    Obtain Supportive Resources-Caregiver Stress   On track    Activities and task to complete in order to accomplish goals.   Keep all upcoming appointments discussed today Continue with compliance of taking medication prescribed by Doctor Implement healthy coping skills discussed to assist with management of symptoms         SDOH assessments and interventions completed:  Yes  SDOH Interventions Today    Flowsheet Row Most Recent Value  SDOH Interventions   Housing Interventions Intervention Not Indicated  Transportation Interventions Intervention Not Indicated  Utilities Interventions Intervention Not Indicated        Care Coordination Interventions:  Yes, provided  Interventions Today    Flowsheet Row Most Recent Value  Chronic Disease   Chronic disease during today's visit Hypertension (HTN), Atrial Fibrillation (AFib), Congestive Heart Failure (CHF), Diabetes, Other  [MDD]  General Interventions   General Interventions Discussed/Reviewed General Interventions Discussed, Programmer, Applications, Doctor Visits, Level of Care  Doctor Visits Discussed/Reviewed Doctor Visits Discussed  Level of Care Adult Daycare, Applications, Assisted Living, Personal Care Services  Applications [place], [place], Personal Care Services  Education Interventions   Applications [place], [place], Personal Care Services  Mental Health Interventions   Mental Health Discussed/Reviewed Mental Health Discussed, Coping Strategies, Depression  Nutrition Interventions    Nutrition Discussed/Reviewed Nutrition Discussed  Pharmacy Interventions   Pharmacy Dicussed/Reviewed Pharmacy Topics Discussed, Medication Adherence  Safety Interventions   Safety Discussed/Reviewed Safety Discussed  Advanced Directive Interventions   Advanced Directives Discussed/Reviewed Guardianship, Advanced Directives Discussed       Follow up plan: Follow up call scheduled for 2-4 weeks    Encounter Outcome:  Patient Visit Completed   Rolin Kerns, MSW, LCSW [facility] Care Management [facility]  [facility] [email or web address] Phone [phone] 5:40 PM

## 2023-01-04 NOTE — Telephone Encounter (Signed)
 [facility] document Home Health Certificate (Order [place] [id number]), to be filled out by provider. Patient requested to send it back via Fax within 5-days. Document is located in providers tray at front office.Please advise at Mobile [phone].

## 2023-01-04 NOTE — Progress Notes (Signed)
 This patient returns to my office for at risk foot care.  This patient requires this care by a professional since this patient will be at risk due to having type 1 diabetes, PVD and coagulation defect due to eliquis  and paxil .  This patient is unable to cut nails himself since the patient cannot reach his nails.These nails are painful walking and wearing shoes.  He presents to the office with his wife..This patient presents for at risk foot care today.  General Appearance  Alert, conversant and in no acute stress.  Vascular  Dorsalis pedis and posterior tibial  pulses are palpable  bilaterally.  Capillary return is within normal limits  bilaterally. Temperature is within normal limits  bilaterally.  Neurologic  Senn-Weinstein monofilament wire test within normal limits  bilaterally. Muscle power within normal limits bilaterally.  Nails Thick disfigured discolored nails with subungual debris  from hallux to fifth toes bilaterally. No evidence of bacterial infection or drainage bilaterally.  Orthopedic  No limitations of motion  feet .  No crepitus or effusions noted.  No bony pathology or digital deformities noted.  Skin  normotropic skin with no porokeratosis noted bilaterally.  No signs of infections or ulcers noted.   Pre-ulcerous callus sub 5th right foot. Hemorrhagic crust dorsolateral aspect 5th MPJ left foot.  Onychomycosis  Pain in right toes  Pain in left toes    Consent was obtained for treatment procedures.   Mechanical debridement of nails 1-5  bilaterally performed with a nail nipper.  Filed with dremel without incident.    Return office visit   3  months                   Told patient to return for periodic foot care and evaluation due to potential at risk complications.   Cordella Bold DPM

## 2023-01-04 NOTE — Patient Instructions (Signed)
 Visit Information  Thank you for taking time to visit with me today. Please don't hesitate to contact me if I can be of assistance to you.   Following are the goals we discussed today:   Goals Addressed             This Visit's Progress    Obtain Supportive Resources-Caregiver Stress   On track    Activities and task to complete in order to accomplish goals.   Keep all upcoming appointments discussed today Continue with compliance of taking medication prescribed by Doctor Implement healthy coping skills discussed to assist with management of symptoms         Our next appointment is by telephone on 1/10 at 1 PM  Please call the care guide team at [phone] if you need to cancel or reschedule your appointment.   If you are experiencing a Mental Health or Behavioral Health Crisis or need someone to talk to, please call the Suicide and Crisis Lifeline: 988 call 911   Patient verbalizes understanding of instructions and care plan provided today and agrees to view in MyChart. Active MyChart status and patient understanding of how to access instructions and care plan via MyChart confirmed with patient.     Keisean Skowron, MSW, LCSW [facility] Care Management [place]  [facility] [email or web address] Phone [phone] 5:40 PM

## 2023-01-06 ENCOUNTER — Encounter: Payer: Self-pay | Admitting: Physician Assistant

## 2023-01-06 ENCOUNTER — Telehealth: Payer: Self-pay | Admitting: Adult Health

## 2023-01-06 IMAGING — DX DG HUMERUS 2V *L*
4 series · 4 of 4 positions shown · non-contrast
Comparison: None.

CLINICAL DATA: Left shoulder pain after fall down stairs today.

EXAM:
LEFT HUMERUS - 2+ VIEW

[humerus ap (1 of 2)]
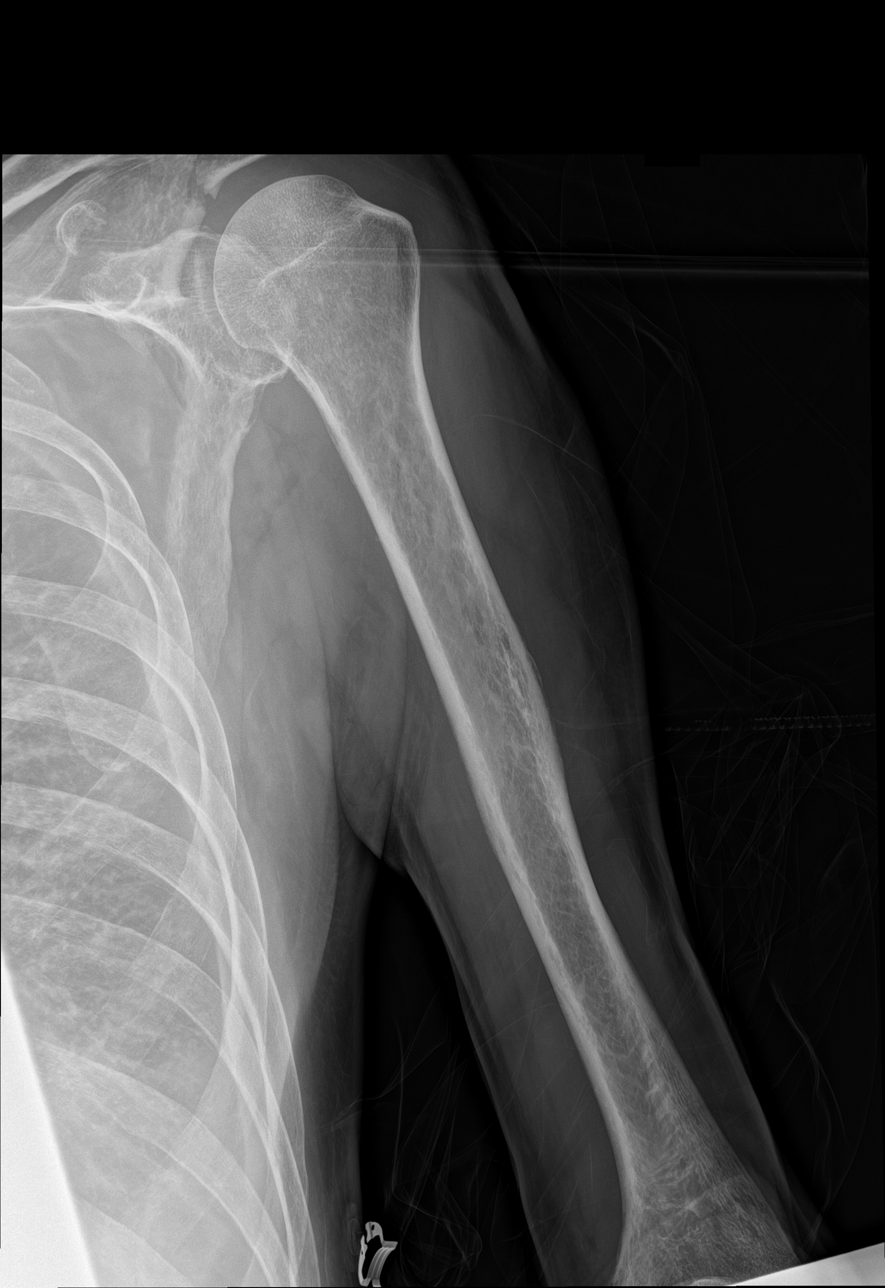

[humerus lat (1 of 2)]
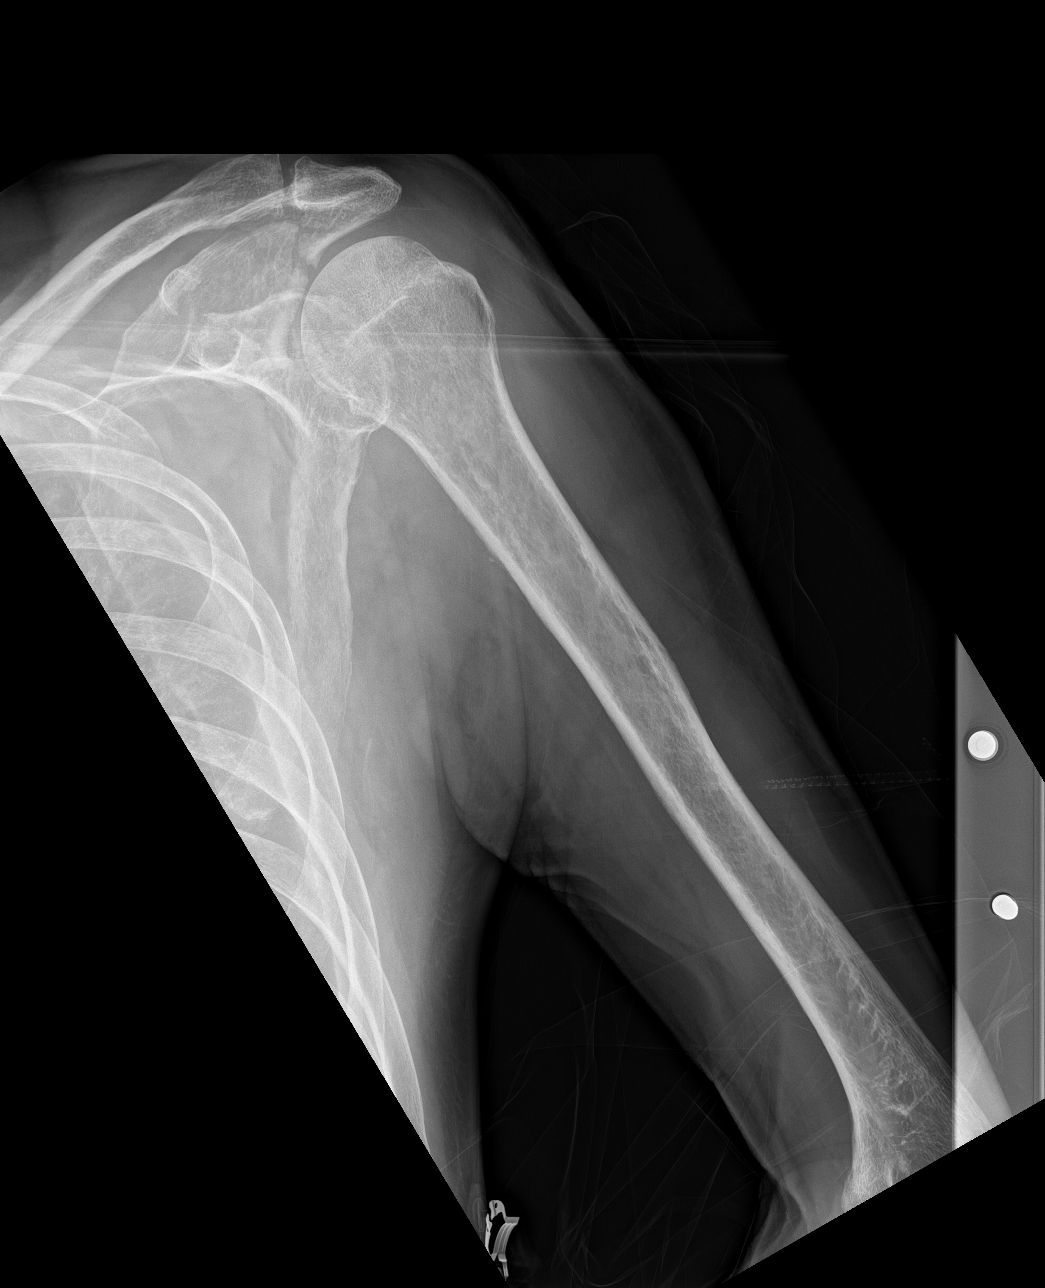

[humerus lat (2 of 2)]
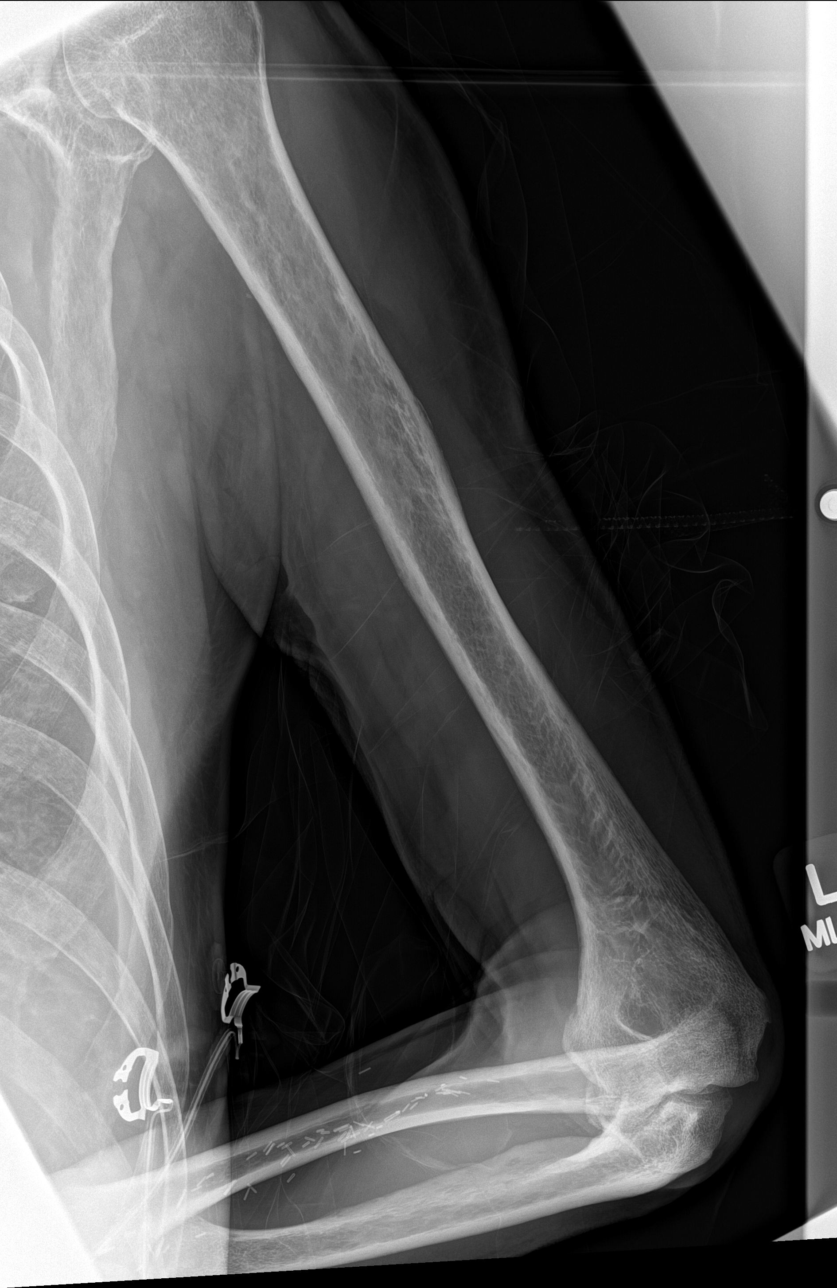

[humerus ap (2 of 2)]
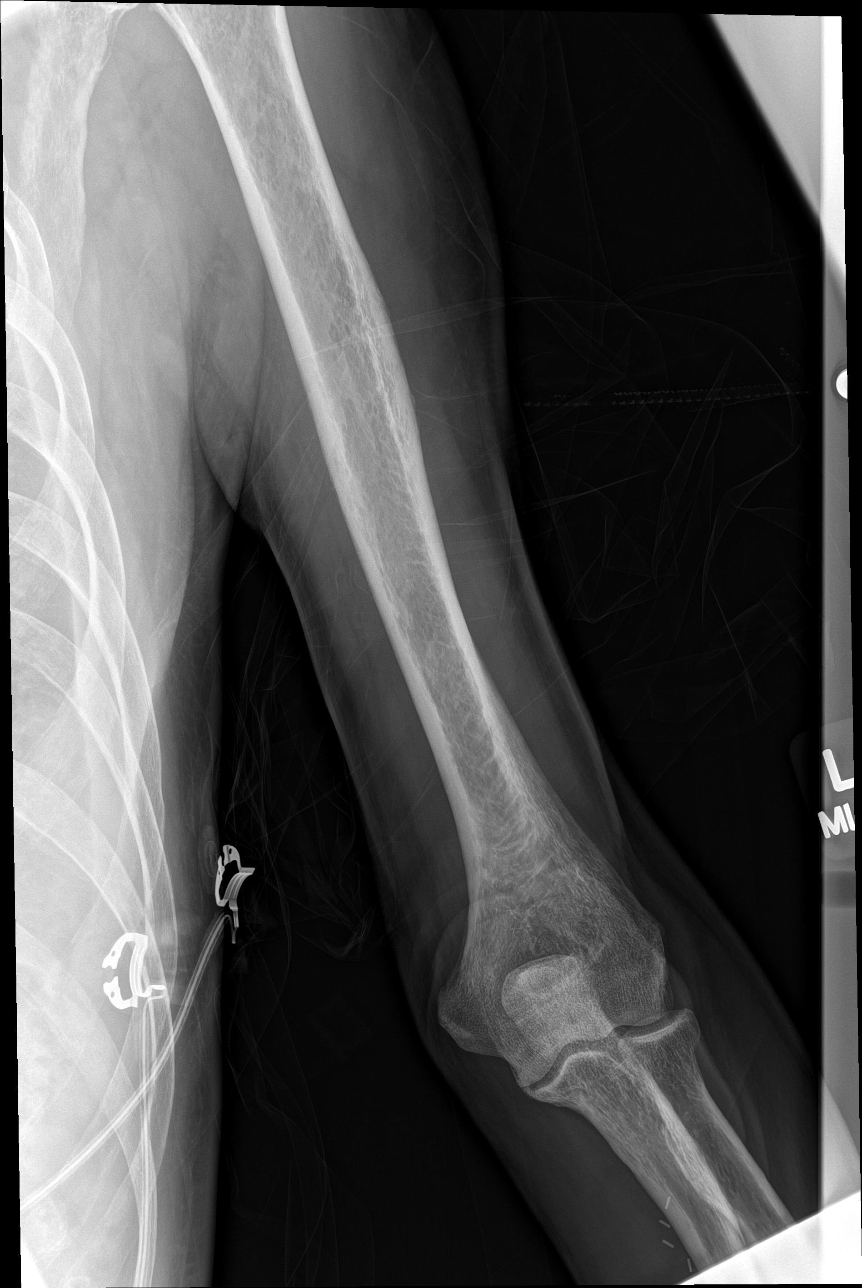

[4 of 4 positions shown; findings below may reference images not displayed]

FINDINGS: Mildly displaced fracture is seen involving the left acromion.
Visualized humerus is unremarkable.
IMPRESSION: Mildly displaced left acromial fracture.

## 2023-01-06 IMAGING — CT CT CHEST W/O CM
3 of 5 series · 16 of 36 positions shown, 18 images · non-contrast
Comparison: Chest x-ray 12/07/2019

CLINICAL DATA: Left chest and scapular pain after fall down stairs

EXAM:
CT CHEST WITHOUT CONTRAST
TECHNIQUE: Multidetector CT imaging of the chest was performed following the
standard protocol without IV contrast.

[Series 2: routine chest without · axial · non-contrast · 0.73mm/px · z∈[-744,-464]mm · 8 of 182 slices shown, 10 images]
[im 21/182  mediastinal]
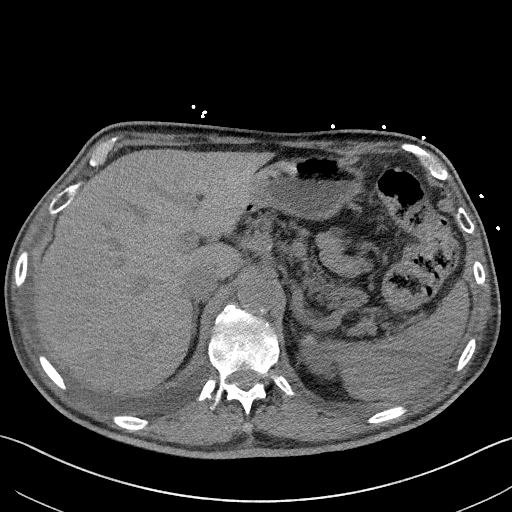
[im 21/182  lung]
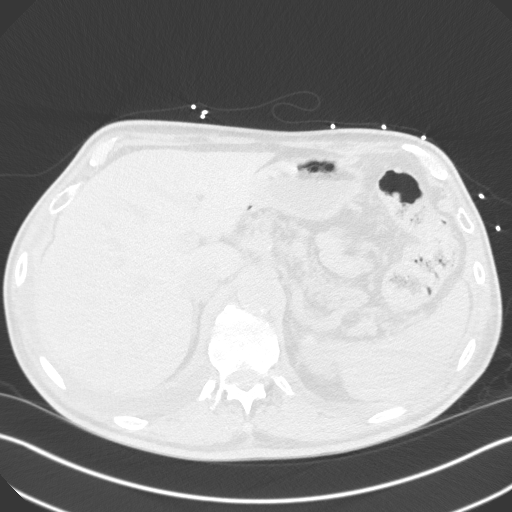
[im 41/182  lung]
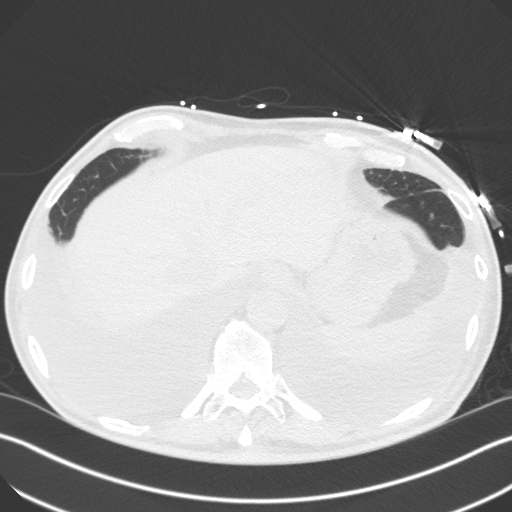
[im 61/182  lung]
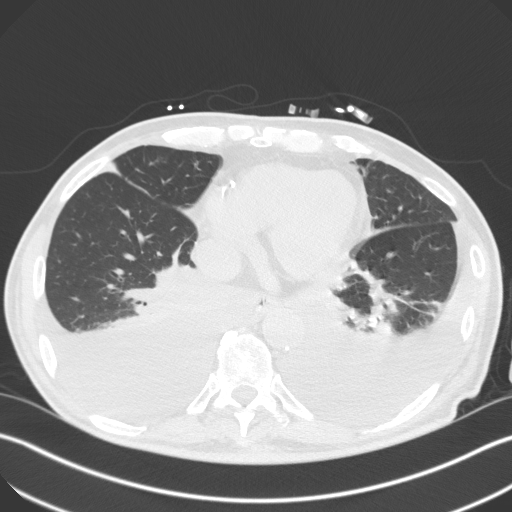
[im 81/182  lung]
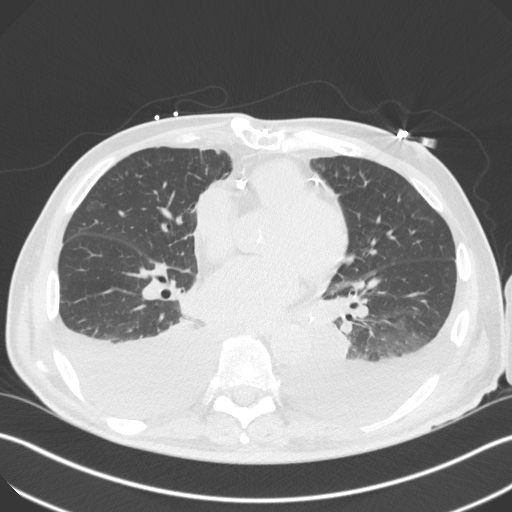
[im 101/182  mediastinal]
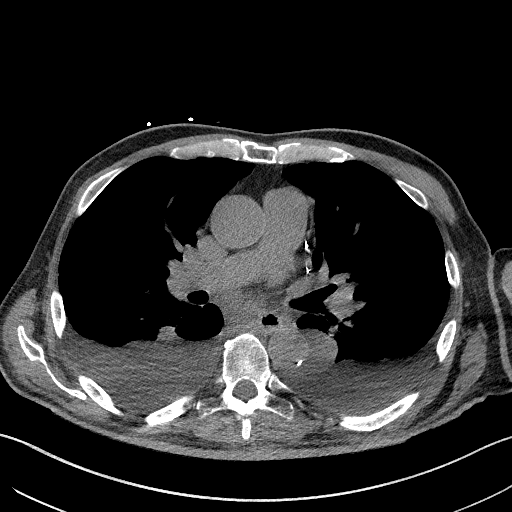
[im 101/182  lung]
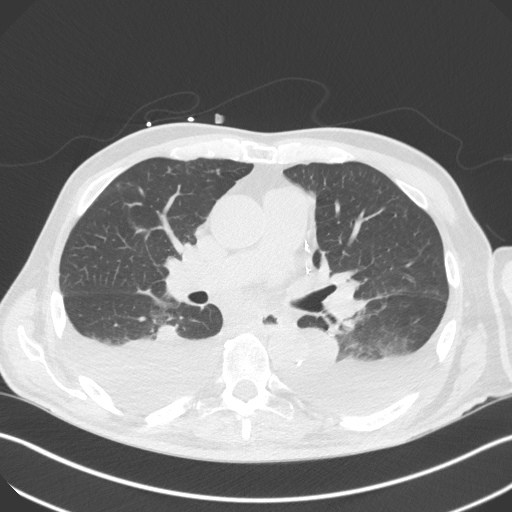
[im 121/182  lung]
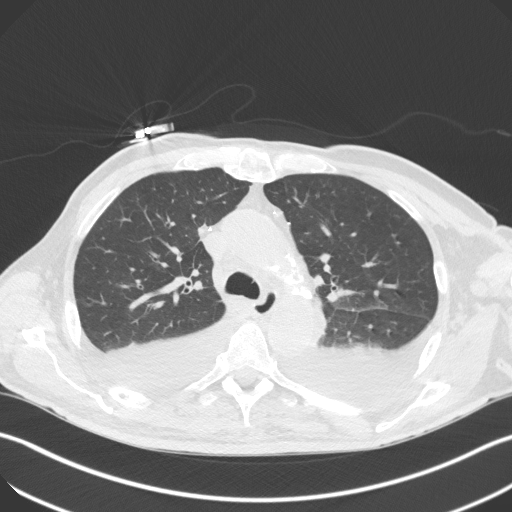
[im 141/182  lung]
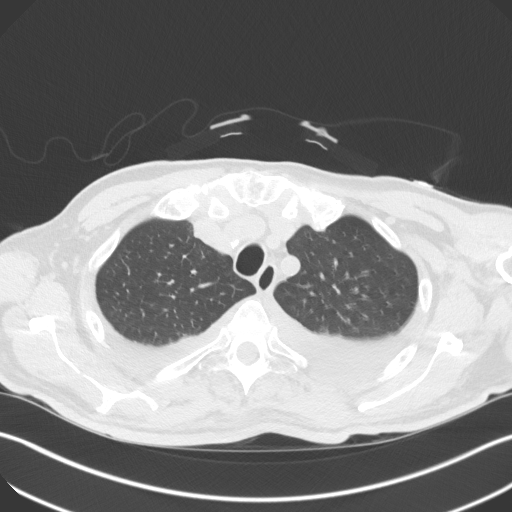
[im 161/182  lung]
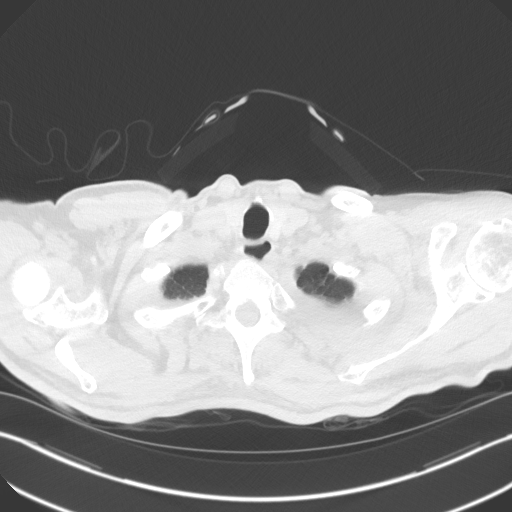

[Series 4: lungs · axial · 0.73mm/px · z∈[-740,-558]mm · 5 of 182 slices shown]
[im 23/182  lung]
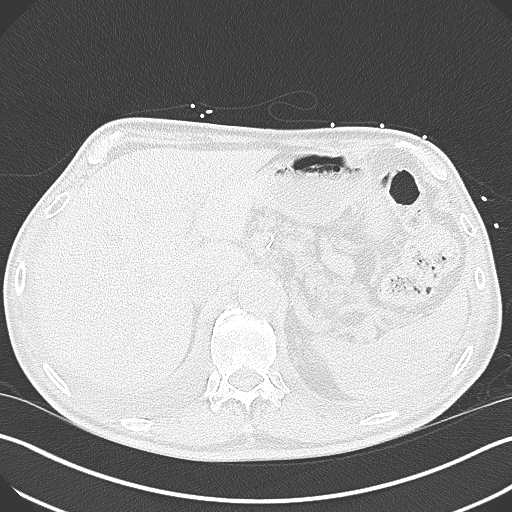
[im 46/182  lung]
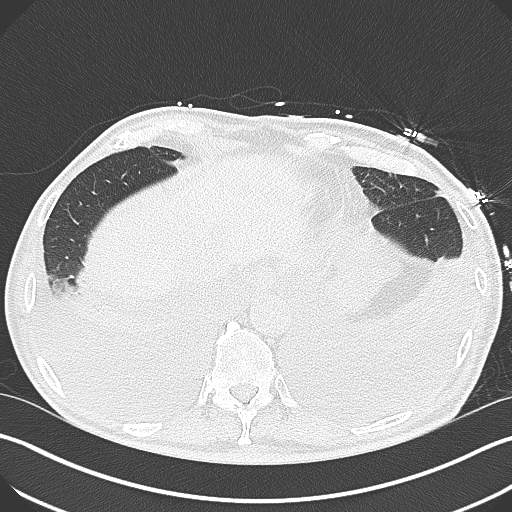
[im 68/182  lung]
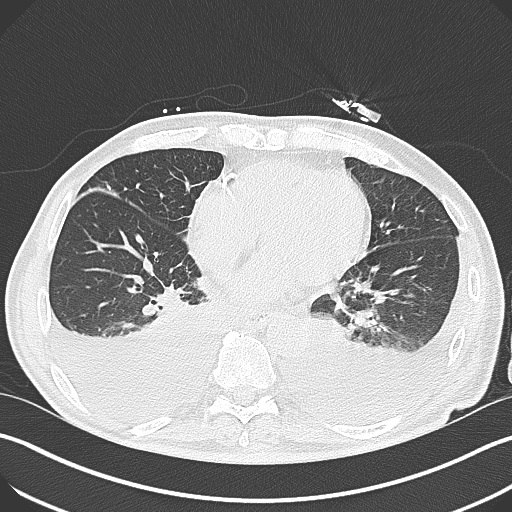
[im 91/182  lung]
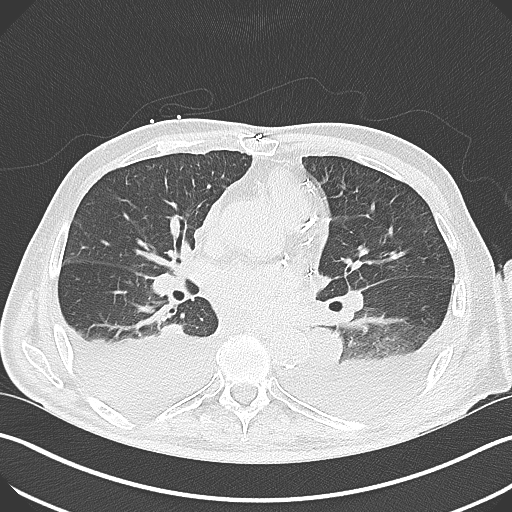
[im 114/182  lung]
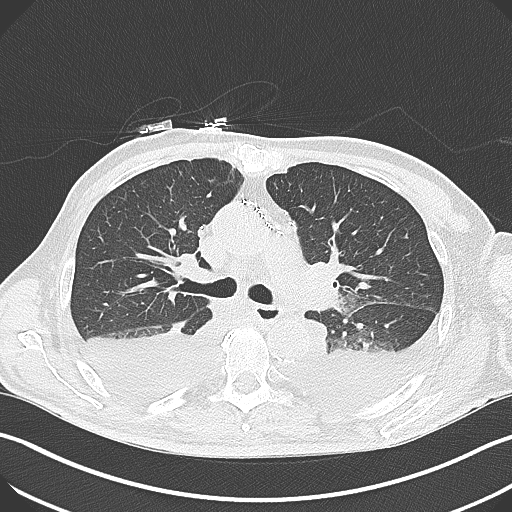

[Series 5: coronal · coronal · 0.72mm/px · 3 of 133 slices shown]
[im 27/133  lung]
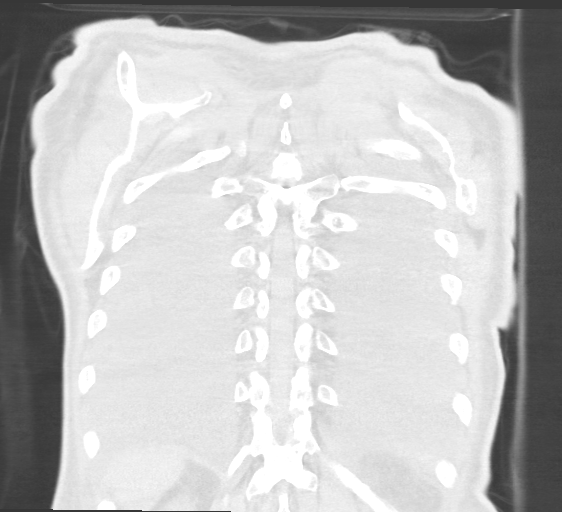
[im 53/133  lung]
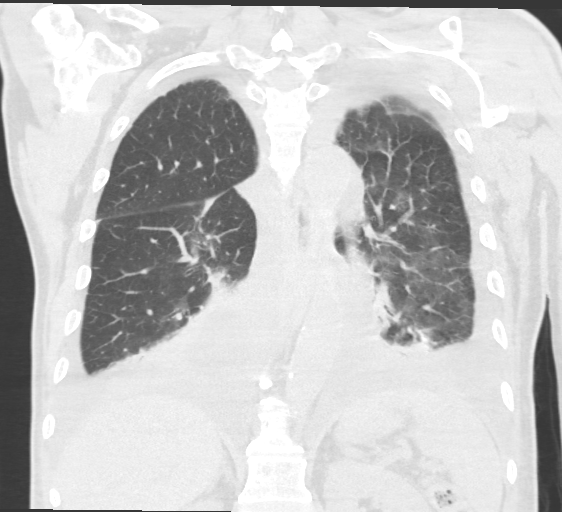
[im 80/133  lung]
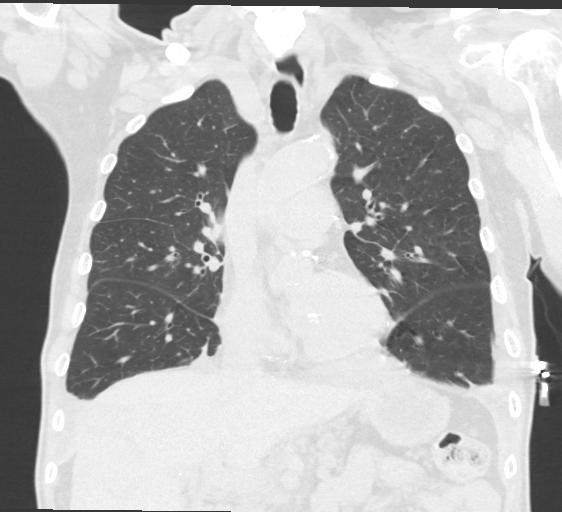

[16 of 36 positions shown; findings below may reference images not displayed]

FINDINGS: Cardiovascular: Heart size is normal. No pericardial effusion.
Thoracic aorta is nonaneurysmal. There are atherosclerotic
calcifications of the aorta and coronary arteries. Patient is status
post CABG.

Mediastinum/Nodes: No axillary, mediastinal, or hilar
lymphadenopathy evident on non contrasted exam. Thyroid, trachea,
and esophagus appear within normal limits.

Lungs/Pleura: Moderate bilateral pleural effusions with associated
compressive atelectasis. No pneumothorax.

Upper Abdomen: No acute abnormality.

Musculoskeletal: Acute mildly comminuted and relatively nondisplaced
fracture of the left scapular spine. Acute fracture of the superior
border of the left coracoid process which is superiorly displaced by
approximately 11 mm (series 6, image 168). Acromioclavicular and
glenohumeral joint alignment appears maintained without dislocation.
No acute rib fracture. Thoracic vertebral body heights and alignment
are maintained without evidence of fracture. Soft tissue edema and
small amount of hemorrhage at the fracture sites. No well-defined
hematoma.
IMPRESSION: 1. Acute fractures of the left scapula including a mildly comminuted
and relatively nondisplaced fracture of the left scapular spine.
Acute fracture of the superior border of the coracoid process which
is superiorly displaced by approximately 11 mm.
2. Negative for rib fracture.
3. Moderate bilateral pleural effusions with associated compressive
atelectasis. No pneumothorax.

Aortic Atherosclerosis (WPCUA-SO0.0).

## 2023-01-06 NOTE — Progress Notes (Deleted)
 PATIENT: [name] [name] DOB: 25-Nov-1955  REASON FOR VISIT: follow up HISTORY FROM: patient PRIMARY NEUROLOGIST:   HISTORY OF PRESENT ILLNESS: Today 01/06/23  [name] [name] is a 68 y.o. male who has been followed in this office for ***. Returns today for follow-up.   HISTORY Acute Ischemic Infarct:  right MCA 4-5 small watershed infarcts, pattern more likely due to hypotension, AKI and dehydration CT no acute finding CTA head & neck no LVO, bilateral ICA siphon, bulb and V4 athero MRI  Punctate foci of restricted diffusion in a watershed distribution in the right hemisphere compatible with acute infarcts. Stable remote lacunar infarct at the genu of the right internal capsule. Stable mild atrophy and white matter disease 2D Echo EF 55 to 60%. The aortic valve is tricuspid. 1.4 x 1 cm nodule on the noncoronary cusp, concern for vegetation  TEE EF 60 to 65%.  LA mildly dilated.  AV no vegetation seen LDL 52 HgbA1c 9.2 UDS neg VTE prophylaxis - anticoagulated with Eliquis  aspirin  81 mg daily and Eliquis  (apixaban ) daily prior to admission, now on home aspirin  81 mg and Eliquis   Therapy recommendations:  SNF Disposition: Pending    REVIEW OF SYSTEMS: Out of a complete 14 system review of symptoms, the patient complains only of the following symptoms, and all other reviewed systems are negative.  ALLERGIES: Allergies  Allergen Reactions   Wool Alcohol [Lanolin] Hives and Itching    Wool fabric    HOME MEDICATIONS: Outpatient Medications Prior to Visit  Medication Sig Dispense Refill   acetaminophen  (TYLENOL ) 500 MG tablet Take 500 mg by mouth as needed for mild pain or moderate pain.     ampicillin  (PRINCIPEN) 500 MG capsule Take 1 capsule (500 mg total) by mouth 4 (four) times daily for 7 days. 28 capsule 0   apixaban  (ELIQUIS ) 5 MG TABS tablet Take 1 tablet (5 mg total) by mouth 2 (two) times daily. 180 tablet 1   aspirin  EC 81 MG EC tablet Take 1 tablet (81 mg  total) by mouth daily. (Patient taking differently: Take 81 mg by mouth in the morning.) 30 tablet 0   atorvastatin  (LIPITOR) 40 MG tablet TAKE 1 TABLET BY MOUTH EVERY DAY 90 tablet 1   hydrOXYzine  (ATARAX ) 25 MG tablet Take 1 tablet (25 mg total) by mouth 3 (three) times daily as needed for anxiety (sleep). 30 tablet 0   Incontinence Supply Disposable (DISPOSABLE BRIEF LARGE) MISC 1 each by Does not apply route as needed. 36 each 5   JANUVIA  50 MG tablet Take 50 mg by mouth daily.     linagliptin  (TRADJENTA ) 5 MG TABS tablet Take 1 tablet (5 mg total) by mouth daily. 30 tablet 0   loperamide  (IMODIUM ) 2 MG capsule Take 1 capsule (2 mg total) by mouth 4 (four) times daily as needed for diarrhea or loose stools. 30 capsule 1   losartan  (COZAAR ) 50 MG tablet Take 1 tablet (50 mg total) by mouth daily. 30 tablet 2   metFORMIN (GLUCOPHAGE-XR) 500 MG 24 hr tablet Take 500 mg by mouth daily.     mirtazapine  (REMERON ) 15 MG tablet TAKE 1 TABLET BY MOUTH EVERYDAY AT BEDTIME 90 tablet 1   Multiple Vitamins-Minerals (MULTIVITAMIN WITH MINERALS) tablet Take 1 tablet by mouth daily.     PARoxetine  (PAXIL ) 20 MG tablet TAKE 1 TABLET BY MOUTH EVERY DAY (Patient taking differently: Take 20 mg by mouth in the morning.) 90 tablet 1   QUEtiapine  (SEROQUEL ) 25 MG  tablet Take 1 tablet (25 mg total) by mouth at bedtime. 30 tablet 0   spironolactone  (ALDACTONE ) 25 MG tablet TAKE 1 TABLET BY MOUTH EVERY DAY 90 tablet 1   No facility-administered medications prior to visit.    PAST MEDICAL HISTORY: Past Medical History:  Diagnosis Date   Anxiety    Arthritis    Cataract    CHF (congestive heart failure) (HCC)    Constipation    pt states goes EOD- hard stools    Coronary artery disease    Depression    Diabetes mellitus without complication (HCC)    Diabetic retinopathy (HCC)    DKA (diabetic ketoacidoses)    Hyperlipidemia    Hypertension    off meds   Hypertensive retinopathy    Non-intractable  vomiting without nausea 06/10/2018   Postherpetic neuralgia 08/11/2021   Speculative diagnosis   Psoriasis    Tubular adenoma of colon 03/2019    PAST SURGICAL HISTORY: Past Surgical History:  Procedure Laterality Date   ABDOMINAL AORTOGRAM W/LOWER EXTREMITY N/A 08/30/2020   Procedure: ABDOMINAL AORTOGRAM W/LOWER EXTREMITY;  Surgeon: Serene Gaile ORN, MD;  Location: [facility];  Service: Cardiovascular;  Laterality: N/A;   ABDOMINAL AORTOGRAM W/LOWER EXTREMITY N/A 11/15/2020   Procedure: ABDOMINAL AORTOGRAM W/LOWER EXTREMITY;  Surgeon: Serene Gaile ORN, MD;  Location: [facility];  Service: Cardiovascular;  Laterality: N/A;   ANKLE SURGERY     right   CARDIAC CATHETERIZATION  09/25/2001   COLONOSCOPY     ~10 yr ago- normal  per pt    CORONARY ARTERY BYPASS GRAFT  09/30/2001   CABG x 4   IR THORACENTESIS ASP PLEURAL SPACE W/IMG GUIDE  05/19/2020   LAPAROSCOPIC INGUINAL HERNIA REPAIR Bilateral 02/09/2010   MASS EXCISION Left 03/12/2013   Procedure: LEFT INDEX EXCISION MASS AND DEBRIDMENT DISTAL INTERPHALANGEAL JOINT;  Surgeon: Franky JONELLE Curia, MD;  Location:  SURGERY CENTER;  Service: Orthopedics;  Laterality: Left;   PERIPHERAL VASCULAR BALLOON ANGIOPLASTY Left 08/30/2020   Procedure: PERIPHERAL VASCULAR BALLOON ANGIOPLASTY;  Surgeon: Serene Gaile ORN, MD;  Location: [facility];  Service: Cardiovascular;  Laterality: Left;  Peroneal   TRANSESOPHAGEAL ECHOCARDIOGRAM (CATH LAB) N/A 11/26/2022   Procedure: TRANSESOPHAGEAL ECHOCARDIOGRAM;  Surgeon: Kate Lonni CROME, MD;  Location: [facility] INV[facility] CV LAB;  Service: Cardiovascular;  Laterality: N/A;    FAMILY HISTORY: Family History  Problem Relation Age of Onset   Lymphoma Father    Cancer Paternal Grandmother        Colon Cancer   Colon cancer Paternal Grandmother    Colon polyps Neg Hx    Esophageal cancer Neg Hx    Rectal cancer Neg Hx    Stomach cancer Neg Hx    Diabetes Mellitus I Neg Hx     Pancreatic cancer Neg Hx     SOCIAL HISTORY: Social History   Socioeconomic History   Marital status: Divorced    Spouse name: Not on file   Number of children: 4   Years of education: 12   Highest education level: 12th grade  Occupational History   Occupation: Retired  Tobacco Use   Smoking status: Former    Types: Cigars    Quit date: 2022    Years since quitting: 3.0   Smokeless tobacco: Never  Vaping Use   Vaping status: Never Used  Substance and Sexual Activity   Alcohol use: Yes    Comment: about once a year   Drug use: No  Sexual activity: Yes  Other Topics Concern   Not on file  Social History Narrative   Lives with his adult son [name].   Has 4 children, 2 sons- both live in [place], 2 daughters one lives in [place], [place]  and youngest daughter attends [employer].    Fiance lives in  [place], [place]   Social Drivers of Health   Financial Resource Strain: Low Risk  (11/21/2022)   Overall Financial Resource Strain (CARDIA)    Difficulty of Paying Living Expenses: Not very hard  Food Insecurity: Food Insecurity Present (11/21/2022)   Hunger Vital Sign    Worried About Running Out of Food in the Last Year: Sometimes true    Ran Out of Food in the Last Year: Sometimes true  Transportation Needs: No Transportation Needs (01/03/2023)   PRAPARE - Administrator, Civil Service (Medical): No    Lack of Transportation (Non-Medical): No  Recent Concern: Transportation Needs - Unmet Transportation Needs (11/21/2022)   PRAPARE - Transportation    Lack of Transportation (Medical): Yes    Lack of Transportation (Non-Medical): Yes  Physical Activity: Inactive (11/21/2022)   Exercise Vital Sign    Days of Exercise per Week: 0 days    Minutes of Exercise per Session: 0 min  Stress: Stress Concern Present (11/21/2022)   [employer] of Occupational Health - Occupational Stress Questionnaire    Feeling of Stress : Very much  Social  Connections: Socially Isolated (11/21/2022)   Social Connection and Isolation Panel [NHANES]    Frequency of Communication with Friends and Family: Once a week    Frequency of Social Gatherings with Friends and Family: Once a week    Attends Religious Services: Never    Database Administrator or Organizations: No    Attends Banker Meetings: Never    Marital Status: Divorced  Catering Manager Violence: Patient Unable To Answer (11/22/2022)   Humiliation, Afraid, Rape, and Kick questionnaire    Fear of Current or Ex-Partner: Patient unable to answer    Emotionally Abused: Patient unable to answer    Physically Abused: Patient unable to answer    Sexually Abused: Patient unable to answer      PHYSICAL EXAM  There were no vitals filed for this visit. There is no height or weight on file to calculate BMI.  Generalized: Well developed, in no acute distress   Neurological examination  Mentation: Alert oriented to time, place, history taking. Follows all commands speech and language fluent Cranial nerve II-XII: Pupils were equal round reactive to light. Extraocular movements were full, visual field were full on confrontational test. Facial sensation and strength were normal. Uvula tongue midline. Head turning and shoulder shrug  were normal and symmetric. Motor: The motor testing reveals 5 over 5 strength of all 4 extremities. Good symmetric motor tone is noted throughout.  Sensory: Sensory testing is intact to soft touch on all 4 extremities. No evidence of extinction is noted.  Coordination: Cerebellar testing reveals good finger-nose-finger and heel-to-shin bilaterally.  Gait and station: Gait is normal. Tandem gait is normal. Romberg is negative. No drift is seen.  Reflexes: Deep tendon reflexes are symmetric and normal bilaterally.   DIAGNOSTIC DATA (LABS, IMAGING, TESTING) - I reviewed patient records, labs, notes, testing and imaging myself where available.  Lab  Results  Component Value Date   WBC 9.1 12/28/2022   HGB 11.6 (L) 12/28/2022   HCT 35.1 (L) 12/28/2022   MCV 91.5 12/28/2022  PLT 261.0 12/28/2022      Component Value Date/Time   NA 138 12/28/2022 1346   K 5.4 No hemolysis seen (H) 12/28/2022 1346   CL 99 12/28/2022 1346   CO2 31 12/28/2022 1346   GLUCOSE 204 (H) 12/28/2022 1346   BUN 34 (H) 12/28/2022 1346   CREATININE 0.97 12/28/2022 1346   CALCIUM  8.8 12/28/2022 1346   PROT 6.6 12/28/2022 1346   ALBUMIN 3.5 12/28/2022 1346   AST 21 12/28/2022 1346   ALT 15 12/28/2022 1346   ALKPHOS 164 (H) 12/28/2022 1346   BILITOT 0.5 12/28/2022 1346   GFRNONAA 50 (L) 11/30/2022 0250   GFRAA >60 06/09/2018 1647   Lab Results  Component Value Date   CHOL 117 11/30/2022   HDL 48 11/30/2022   LDLCALC 52 11/30/2022   LDLDIRECT 37.0 03/16/2020   TRIG 84 11/30/2022   CHOLHDL 2.4 11/30/2022   Lab Results  Component Value Date   HGBA1C 9.2 (H) 11/30/2022   Lab Results  Component Value Date   VITAMINB12 308 11/21/2022   Lab Results  Component Value Date   TSH 4.369 11/20/2022      ASSESSMENT AND PLAN 68 y.o. year old male  has a past medical history of Anxiety, Arthritis, Cataract, CHF (congestive heart failure) (HCC), Constipation, Coronary artery disease, Depression, Diabetes mellitus without complication (HCC), Diabetic retinopathy (HCC), DKA (diabetic ketoacidoses), Hyperlipidemia, Hypertension, Hypertensive retinopathy, Non-intractable vomiting without nausea (06/10/2018), Postherpetic neuralgia (08/11/2021), Psoriasis, and Tubular adenoma of colon (03/2019). here with:  Acute Ischemic Infarct:  right MCA 4-5 small watershed infarcts, pattern more likely due to hypotension, AKI and dehydration   *** : Residual deficit: ***. Continue Eliquis  (apixaban ) daily  and ***  for secondary stroke prevention.  Discussed secondary stroke prevention measures and importance of close PCP follow up for aggressive stroke risk factor  management. I have gone over the pathophysiology of stroke, warning signs and symptoms, risk factors and their management in some detail with instructions to go to the closest emergency room for symptoms of concern. HTN: BP goal <130/90.  Stable on *** per PCP HLD: LDL goal <70. Recent LDL ***.  DMII: A1c goal<7.0. Recent A1c ***.  Encouraged patient to monitor diet and encouraged exercise FU with our office ***      Duwaine Russell, MSN, NP-C 01/06/2023, 1:08 AM [facility] [address] [place], [place] [address] [phone]

## 2023-01-06 NOTE — Telephone Encounter (Signed)
 Pt was called to be informed that due to inclement weather, their appt has been cx and they will be called back tomorrow to r/s. Pt verbalized understanding.

## 2023-01-07 ENCOUNTER — Inpatient Hospital Stay: Payer: PPO | Admitting: Adult Health

## 2023-01-07 ENCOUNTER — Telehealth: Payer: Self-pay | Admitting: Physician Assistant

## 2023-01-07 NOTE — Telephone Encounter (Signed)
 Forms reviewed and signed by PCP; faxed back as requested and received confirmation fax sent successfully

## 2023-01-07 NOTE — Progress Notes (Signed)
 PATIENT: [name] [name] DOB: [birth year]  REASON FOR VISIT: follow up HISTORY FROM: patient PRIMARY NEUROLOGIST: Dr. Rosemarie  Chief Complaint  Patient presents with   New Patient (Initial Visit)    Pt in 19, pt is here [name]  Pt is here for hospital follow up after CVA. Pt states he feels he is doing well. Pt's son states he has noticed a change in pts memory.      HISTORY OF PRESENT ILLNESS: Today 01/07/23  [name] [name] is a 68 y.o. male here for hospital FU after right MCA 4-5 small watershed infarcts likely due to hypotension, AKI and dehydration.  Returns today for follow-up. He is currently is living at home alone. Son checks on him frequently. Has home health services coming in now. No deficits from stroke. Reports generalized weakness. Uses a rollator. Has seen PCP. BP is in normal range today. Cholesterol in normal range. HgbA1c high- seeing endocrinology. Son reports some short term memory issues. Reports long term memory is intact.    HISTORY Acute Ischemic Infarct:  right MCA 4-5 small watershed infarcts, pattern more likely due to hypotension, AKI and dehydration CT no acute finding CTA head & neck no LVO, bilateral ICA siphon, bulb and V4 athero MRI  Punctate foci of restricted diffusion in a watershed distribution in the right hemisphere compatible with acute infarcts. Stable remote lacunar infarct at the genu of the right internal capsule. Stable mild atrophy and white matter disease 2D Echo EF 55 to 60%. The aortic valve is tricuspid. 1.4 x 1 cm nodule on the noncoronary cusp, concern for vegetation  TEE EF 60 to 65%.  LA mildly dilated.  AV no vegetation seen LDL 52 HgbA1c 9.2 UDS neg VTE prophylaxis - anticoagulated with Eliquis  aspirin  81 mg daily and Eliquis  (apixaban ) daily prior to admission, now on home aspirin  81 mg and Eliquis   Therapy recommendations:  SNF Disposition: Pending    REVIEW OF SYSTEMS: Out of a complete 14 system review of  symptoms, the patient complains only of the following symptoms, and all other reviewed systems are negative.  ALLERGIES: Allergies  Allergen Reactions   Wool Alcohol [Lanolin] Hives and Itching    Wool fabric    HOME MEDICATIONS: Outpatient Medications Prior to Visit  Medication Sig Dispense Refill   acetaminophen  (TYLENOL ) 500 MG tablet Take 500 mg by mouth as needed for mild pain or moderate pain.     ampicillin  (PRINCIPEN) 500 MG capsule Take 1 capsule (500 mg total) by mouth 4 (four) times daily for 7 days. 28 capsule 0   apixaban  (ELIQUIS ) 5 MG TABS tablet Take 1 tablet (5 mg total) by mouth 2 (two) times daily. 180 tablet 1   aspirin  EC 81 MG EC tablet Take 1 tablet (81 mg total) by mouth daily. (Patient taking differently: Take 81 mg by mouth in the morning.) 30 tablet 0   atorvastatin  (LIPITOR) 40 MG tablet TAKE 1 TABLET BY MOUTH EVERY DAY 90 tablet 1   hydrOXYzine  (ATARAX ) 25 MG tablet Take 1 tablet (25 mg total) by mouth 3 (three) times daily as needed for anxiety (sleep). 30 tablet 0   Incontinence Supply Disposable (DISPOSABLE BRIEF LARGE) MISC 1 each by Does not apply route as needed. 36 each 5   JANUVIA  50 MG tablet Take 50 mg by mouth daily.     linagliptin  (TRADJENTA ) 5 MG TABS tablet Take 1 tablet (5 mg total) by mouth daily. 30 tablet 0   loperamide  (IMODIUM )  2 MG capsule Take 1 capsule (2 mg total) by mouth 4 (four) times daily as needed for diarrhea or loose stools. 30 capsule 1   losartan  (COZAAR ) 50 MG tablet Take 1 tablet (50 mg total) by mouth daily. 30 tablet 2   metFORMIN (GLUCOPHAGE-XR) 500 MG 24 hr tablet Take 500 mg by mouth daily.     mirtazapine  (REMERON ) 15 MG tablet TAKE 1 TABLET BY MOUTH EVERYDAY AT BEDTIME 90 tablet 1   Multiple Vitamins-Minerals (MULTIVITAMIN WITH MINERALS) tablet Take 1 tablet by mouth daily.     PARoxetine  (PAXIL ) 20 MG tablet TAKE 1 TABLET BY MOUTH EVERY DAY (Patient taking differently: Take 20 mg by mouth in the morning.) 90 tablet 1    QUEtiapine  (SEROQUEL ) 25 MG tablet Take 1 tablet (25 mg total) by mouth at bedtime. 30 tablet 0   spironolactone  (ALDACTONE ) 25 MG tablet TAKE 1 TABLET BY MOUTH EVERY DAY 90 tablet 1   No facility-administered medications prior to visit.    PAST MEDICAL HISTORY: Past Medical History:  Diagnosis Date   Anxiety    Arthritis    Cataract    CHF (congestive heart failure) (HCC)    Constipation    pt states goes EOD- hard stools    Coronary artery disease    Depression    Diabetes mellitus without complication (HCC)    Diabetic retinopathy (HCC)    DKA (diabetic ketoacidoses)    Hyperlipidemia    Hypertension    off meds   Hypertensive retinopathy    Non-intractable vomiting without nausea 06/10/2018   Postherpetic neuralgia 08/11/2021   Speculative diagnosis   Psoriasis    Tubular adenoma of colon 03/2019    PAST SURGICAL HISTORY: Past Surgical History:  Procedure Laterality Date   ABDOMINAL AORTOGRAM W/LOWER EXTREMITY N/A 08/30/2020   Procedure: ABDOMINAL AORTOGRAM W/LOWER EXTREMITY;  Surgeon: Serene Gaile ORN, MD;  Location: [facility];  Service: Cardiovascular;  Laterality: N/A;   ABDOMINAL AORTOGRAM W/LOWER EXTREMITY N/A 11/15/2020   Procedure: ABDOMINAL AORTOGRAM W/LOWER EXTREMITY;  Surgeon: Serene Gaile ORN, MD;  Location: [facility];  Service: Cardiovascular;  Laterality: N/A;   ANKLE SURGERY     right   CARDIAC CATHETERIZATION  09/25/2001   COLONOSCOPY     ~10 yr ago- normal  per pt    CORONARY ARTERY BYPASS GRAFT  09/30/2001   CABG x 4   IR THORACENTESIS ASP PLEURAL SPACE W/IMG GUIDE  05/19/2020   LAPAROSCOPIC INGUINAL HERNIA REPAIR Bilateral 02/09/2010   MASS EXCISION Left 03/12/2013   Procedure: LEFT INDEX EXCISION MASS AND DEBRIDMENT DISTAL INTERPHALANGEAL JOINT;  Surgeon: Franky JONELLE Curia, MD;  Location: [facility];  Service: Orthopedics;  Laterality: Left;   PERIPHERAL VASCULAR BALLOON ANGIOPLASTY Left 08/30/2020   Procedure:  PERIPHERAL VASCULAR BALLOON ANGIOPLASTY;  Surgeon: Serene Gaile ORN, MD;  Location: [facility];  Service: Cardiovascular;  Laterality: Left;  Peroneal   TRANSESOPHAGEAL ECHOCARDIOGRAM (CATH LAB) N/A 11/26/2022   Procedure: TRANSESOPHAGEAL ECHOCARDIOGRAM;  Surgeon: Kate Lonni CROME, MD;  Location: [facility];  Service: Cardiovascular;  Laterality: N/A;    FAMILY HISTORY: Family History  Problem Relation Age of Onset   Lymphoma Father    Cancer Paternal Grandmother        Colon Cancer   Colon cancer Paternal Grandmother    Colon polyps Neg Hx    Esophageal cancer Neg Hx    Rectal cancer Neg Hx    Stomach cancer Neg Hx  Diabetes Mellitus I Neg Hx    Pancreatic cancer Neg Hx     SOCIAL HISTORY: Social History   Socioeconomic History   Marital status: Divorced    Spouse name: Not on file   Number of children: 4   Years of education: 12   Highest education level: 12th grade  Occupational History   Occupation: Retired  Tobacco Use   Smoking status: Former    Types: Cigars    Quit date: 2022    Years since quitting: 3.0   Smokeless tobacco: Never  Vaping Use   Vaping status: Never Used  Substance and Sexual Activity   Alcohol use: Yes    Comment: about once a year   Drug use: No   Sexual activity: Yes  Other Topics Concern   Not on file  Social History Narrative   Lives with his adult son [name].   Has 4 children, 2 sons- both live in [place], 2 daughters one lives in [name], [place]  and youngest daughter attends [employer].    Fiance lives in  [place], [place]   Social Drivers of Health   Financial Resource Strain: Low Risk  (11/21/2022)   Overall Financial Resource Strain (CARDIA)    Difficulty of Paying Living Expenses: Not very hard  Food Insecurity: Food Insecurity Present (11/21/2022)   Hunger Vital Sign    Worried About Running Out of Food in the Last Year: Sometimes true    Ran Out of Food in the Last Year: Sometimes  true  Transportation Needs: No Transportation Needs (01/03/2023)   PRAPARE - Administrator, Civil Service (Medical): No    Lack of Transportation (Non-Medical): No  Recent Concern: Transportation Needs - Unmet Transportation Needs (11/21/2022)   PRAPARE - Transportation    Lack of Transportation (Medical): Yes    Lack of Transportation (Non-Medical): Yes  Physical Activity: Inactive (11/21/2022)   Exercise Vital Sign    Days of Exercise per Week: 0 days    Minutes of Exercise per Session: 0 min  Stress: Stress Concern Present (11/21/2022)   [employer] of Occupational Health - Occupational Stress Questionnaire    Feeling of Stress : Very much  Social Connections: Socially Isolated (11/21/2022)   Social Connection and Isolation Panel [NHANES]    Frequency of Communication with Friends and Family: Once a week    Frequency of Social Gatherings with Friends and Family: Once a week    Attends Religious Services: Never    Database Administrator or Organizations: No    Attends Banker Meetings: Never    Marital Status: Divorced  Catering Manager Violence: Patient Unable To Answer (11/22/2022)   Humiliation, Afraid, Rape, and Kick questionnaire    Fear of Current or Ex-Partner: Patient unable to answer    Emotionally Abused: Patient unable to answer    Physically Abused: Patient unable to answer    Sexually Abused: Patient unable to answer      PHYSICAL EXAM  Vitals:   01/08/23 0849  BP: 130/80  Pulse: 68  Weight: 154 lb (69.9 kg)  Height: 6' 2.5 (1.892 m)   Body mass index is 19.51 kg/m.     01/08/2023    9:51 AM 11/21/2022    1:40 PM  MMSE - Mini Mental State Exam  Not completed:  Unable to complete  Orientation to time 4   Orientation to Place 5   Registration 3   Attention/ Calculation 5   Recall 3   Language-  name 2 objects 2   Language- repeat 1   Language- follow 3 step command 3   Language- read & follow direction 1   Write a  sentence 1   Copy design 0   Total score 28      Generalized: Well developed, in no acute distress   Neurological examination  Mentation: Alert oriented to time, place, history taking. Follows all commands speech and language fluent Cranial nerve II-XII: Pupils were equal round reactive to light. Extraocular movements were full, visual field were full on confrontational test. Facial sensation and strength were normal. Uvula tongue midline. Head turning and shoulder shrug  were normal and symmetric. Motor: The motor testing reveals 5 over 5 strength of all 4 extremities. Good symmetric motor tone is noted throughout.  Sensory: Sensory testing is intact to soft touch on all 4 extremities. No evidence of extinction is noted.  Coordination: Cerebellar testing reveals good finger-nose-finger and heel-to-shin bilaterally.  Gait and station: unable to stand without assistance. Uses a rollator  DIAGNOSTIC DATA (LABS, IMAGING, TESTING) - I reviewed patient records, labs, notes, testing and imaging myself where available.  Lab Results  Component Value Date   WBC 9.1 12/28/2022   HGB 11.6 (L) 12/28/2022   HCT 35.1 (L) 12/28/2022   MCV 91.5 12/28/2022   PLT 261.0 12/28/2022      Component Value Date/Time   NA 138 12/28/2022 1346   K 5.4 No hemolysis seen (H) 12/28/2022 1346   CL 99 12/28/2022 1346   CO2 31 12/28/2022 1346   GLUCOSE 204 (H) 12/28/2022 1346   BUN 34 (H) 12/28/2022 1346   CREATININE 0.97 12/28/2022 1346   CALCIUM  8.8 12/28/2022 1346   PROT 6.6 12/28/2022 1346   ALBUMIN 3.5 12/28/2022 1346   AST 21 12/28/2022 1346   ALT 15 12/28/2022 1346   ALKPHOS 164 (H) 12/28/2022 1346   BILITOT 0.5 12/28/2022 1346   GFRNONAA 50 (L) 11/30/2022 0250   GFRAA >60 06/09/2018 1647   Lab Results  Component Value Date   CHOL 117 11/30/2022   HDL 48 11/30/2022   LDLCALC 52 11/30/2022   LDLDIRECT 37.0 03/16/2020   TRIG 84 11/30/2022   CHOLHDL 2.4 11/30/2022   Lab Results  Component  Value Date   HGBA1C 9.2 (H) 11/30/2022   Lab Results  Component Value Date   VITAMINB12 308 11/21/2022   Lab Results  Component Value Date   TSH 4.369 11/20/2022      ASSESSMENT AND PLAN 68 y.o. year old male  has a past medical history of Anxiety, Arthritis, Cataract, CHF (congestive heart failure) (HCC), Constipation, Coronary artery disease, Depression, Diabetes mellitus without complication (HCC), Diabetic retinopathy (HCC), DKA (diabetic ketoacidoses), Hyperlipidemia, Hypertension, Hypertensive retinopathy, Non-intractable vomiting without nausea (06/10/2018), Postherpetic neuralgia (08/11/2021), Psoriasis, and Tubular adenoma of colon (03/2019). here with:  Acute Ischemic Infarct:  right MCA 4-5 small watershed infarcts, pattern more likely due to hypotension, AKI and dehydration    Continue Eliquis  (apixaban ) daily  and ASA daily  for secondary stroke prevention.  Discussed secondary stroke prevention measures and importance of close PCP follow up for aggressive stroke risk factor management. I have gone over the pathophysiology of stroke, warning signs and symptoms, risk factors and their management in some detail with instructions to go to the closest emergency room for symptoms of concern. HTN: BP goal <130/90.  HLD: LDL goal <70. Recent LDL 52.  DMII: A1c goal<7.0. Recent A1c 9.2.  MMSE 25/30 Encouraged patient to monitor diet and  encouraged exercise FU with our office 6-8 months to recheck MMSE      Duwaine Russell, MSN, NP-C 01/07/2023, 5:06 PM [facility] [address], [address] [place], [place] [address] [phone]

## 2023-01-07 NOTE — Telephone Encounter (Signed)
 Please see call note from patient's home health office and advise or return call to discuss

## 2023-01-07 NOTE — Telephone Encounter (Signed)
 Forms placed in provider box for signatures

## 2023-01-07 NOTE — Telephone Encounter (Signed)
 Please see pt son message regarding recent blood glucose readings and advise

## 2023-01-07 NOTE — Telephone Encounter (Signed)
LVM for her to call me back

## 2023-01-07 NOTE — Telephone Encounter (Unsigned)
 Copied from CRM [id number]. Topic: Clinical - Home Health Verbal Orders >> Jan 07, 2023  2:25 PM Zacyrah J wrote: Caller/Agency: Eleanor Rushing Number: [phone]- can't approve those orders leave a messages  Service Requested: Occupational Therapy/ add on evaluation - skilled nursing/ medical soical work  Frequency: 1 time for 6 weeks  Any new concerns about the patient? Yes / 5 or 6 rounds of diarrhea and 6 or 6 rounds of urine. Pt is cover in it everyday. Pt has stage 2 wounds, can't clean himself and lose 43 pounds, this has been going on for a while

## 2023-01-07 NOTE — Telephone Encounter (Signed)
 Pt will see Aundra Millet now on 1/7.

## 2023-01-08 ENCOUNTER — Encounter: Payer: Self-pay | Admitting: Adult Health

## 2023-01-08 ENCOUNTER — Ambulatory Visit: Payer: PPO | Admitting: Adult Health

## 2023-01-08 ENCOUNTER — Encounter: Payer: Self-pay | Admitting: Licensed Clinical Social Worker

## 2023-01-08 VITALS — BP 130/80 | HR 68 | Ht 74.5 in | Wt 154.0 lb

## 2023-01-08 DIAGNOSIS — I6389 Other cerebral infarction: Secondary | ICD-10-CM | POA: Diagnosis not present

## 2023-01-08 DIAGNOSIS — E119 Type 2 diabetes mellitus without complications: Secondary | ICD-10-CM

## 2023-01-08 DIAGNOSIS — Z7984 Long term (current) use of oral hypoglycemic drugs: Secondary | ICD-10-CM | POA: Diagnosis not present

## 2023-01-08 NOTE — Telephone Encounter (Signed)
 Please review latest message regarding patient blood glucose levels and advise

## 2023-01-08 NOTE — Telephone Encounter (Signed)
 I just forwarded your message to my front desk staff, I am able to see him tomorrow at 11:10 if he can  Thank you

## 2023-01-08 NOTE — Patient Instructions (Signed)
 Your Plan:  Continue Eliquis  and ASA  Blood pressure goal <130/90 Cholesterol LDL goal <70 Diabetes goal A1c <7 Monitor diet and try to exercise   Thank you for coming to see us  at [facility]. I hope we have been able to provide you high quality care today.  You may receive a patient satisfaction survey over the next few weeks. We would appreciate your feedback and comments so that we may continue to improve ourselves and the health of our patients.

## 2023-01-09 ENCOUNTER — Telehealth: Payer: PPO | Admitting: Internal Medicine

## 2023-01-09 ENCOUNTER — Encounter: Payer: Self-pay | Admitting: Internal Medicine

## 2023-01-09 DIAGNOSIS — Z794 Long term (current) use of insulin: Secondary | ICD-10-CM

## 2023-01-09 DIAGNOSIS — E1142 Type 2 diabetes mellitus with diabetic polyneuropathy: Secondary | ICD-10-CM

## 2023-01-09 DIAGNOSIS — E113593 Type 2 diabetes mellitus with proliferative diabetic retinopathy without macular edema, bilateral: Secondary | ICD-10-CM

## 2023-01-09 DIAGNOSIS — E1159 Type 2 diabetes mellitus with other circulatory complications: Secondary | ICD-10-CM | POA: Diagnosis not present

## 2023-01-09 MED ORDER — SITAGLIPTIN PHOSPHATE 100 MG PO TABS: 100.0000 mg | ORAL_TABLET | Freq: Every day | ORAL | 3 refills | Status: DC

## 2023-01-09 MED ORDER — GLIPIZIDE 5 MG PO TABS: 5.0000 mg | ORAL_TABLET | Freq: Every day | ORAL | 3 refills | Status: DC

## 2023-01-09 NOTE — Telephone Encounter (Signed)
 Please see son response to previous message

## 2023-01-09 NOTE — Telephone Encounter (Signed)
 I spoke directly with patient's social worker, Rolin Kerns, yesterday (01/08/23). We discussed his worrisome case. The plan for now is she is going to update [facility] with this new information (he already has a case open). We will continue to dialogue with his son about trying to get him placed in long term services. He has a follow-up appt with his endocrinologist today.

## 2023-01-09 NOTE — Telephone Encounter (Signed)
 Please see patient son message regarding appointment completed today and update on medications

## 2023-01-09 NOTE — Progress Notes (Signed)
 Virtual Visit via Video Note  I connected with [name] on 01/09/2023 at 11:10 AM  by a video enabled telemedicine application and verified that I am speaking with the correct person using two identifiers.   I discussed the limitations of evaluation and management by telemedicine and the availability of in person appointments. The patient expressed understanding and agreed to proceed.   -Location of the patient :home -Location of the provider : Office -The names of all persons participating in the telemedicine service : Pt , [name] and myself            Name: [name] [name]  Age/ Sex: 68 y.o., male   MRN/ DOB: [id number], 12/05/1955     PCP: [name] HERO, PA-C   Reason for Endocrinology Evaluation: Type 2 Diabetes Mellitus  Initial Endocrine Consultative Visit: 07/15/2020    PATIENT IDENTIFIER: [name] [name] is a 68 y.o. male with a past medical history of DM, CAD, HTN, PVD, PAF. The patient has followed with Endocrinology clinic since 07/15/2020 for consultative assistance with management of his diabetes.  DIABETIC HISTORY:  Mr. Arviso was diagnosed with DM in 2011, and started insulin  therapy in 2013. His hemoglobin A1c has ranged from 9.2% in 2023, peaking at >15.0% in 2022.  Saw Dr. Kassie last in 04/2021  Insulin  pump was discontinued during hospitalization November/2024 and was deemed not fit due to noncompliance  SUBJECTIVE:   During the last visit (06/08/2022): A1c 11.2%  Today (01/09/2023): [name] [name] is here for a follow up on diabetes management.  He checks his blood sugars multiple  times daily.      Patient was evaluated by his PCP for incontinence of feces, on loperamide , referred to GI Denies nausea, vomiting  Patient continues with chronic diarrhea    Patient was hospitalized for hypertensive urgency in November 2024, during hospitalization and rehab stay his insulin  pump was removed due to hypoglycemia, during the  hospitalization he was also diagnosed with mild cognitive impairment  (MMSE 25/30) and CVA which was attributed to noncompliance with Eliquis  and uncontrolled HTN The recommendation at discharge with either to consider home health nurse aide versus placement in a long-term facility  Percent today, there is no plans for long-term facility placement  Upon his discharge he was started on Januvia  and metformin   HOME DIABETES REGIMEN:  Novolog     Statin: yes ACE-I/ARB: yes    CONTINUOUS GLUCOSE MONITORING RECORD INTERPRETATION    Dates of Recording: 12/27/2022-01/08/2022  Sensor description:dexcom  Results statistics:   CGM use % of time 50  Average and SD 222/49  Time in range 27 %  % Time Above 180 42  % Time above 250 31  % Time Below target 0      Glycemic patterns summary: Hyperglycemia noted during the day and night   Hyperglycemic episodes all day and night  Hypoglycemic episodes occurred N/A  Overnight periods: High     DIABETIC COMPLICATIONS: Microvascular complications:  Retinopathy B/L , blind in left eye  Denies: CKD Last Eye Exam: Completed 05/11/2022  Macrovascular complications:  CAD (S/P CABG), CVA (11/2022)  HISTORY:  Past Medical History:  Past Medical History:  Diagnosis Date   Anxiety    Arthritis    Cataract    CHF (congestive heart failure) (HCC)    Constipation    pt states goes EOD- hard stools    Coronary artery disease    Depression    Diabetes mellitus without complication (HCC)  Diabetic retinopathy (HCC)    DKA (diabetic ketoacidoses)    Hyperlipidemia    Hypertension    off meds   Hypertensive retinopathy    Non-intractable vomiting without nausea 06/10/2018   Postherpetic neuralgia 08/11/2021   Speculative diagnosis   Psoriasis    Tubular adenoma of colon 03/2019   Past Surgical History:  Past Surgical History:  Procedure Laterality Date   ABDOMINAL AORTOGRAM W/LOWER EXTREMITY N/A 08/30/2020   Procedure:  ABDOMINAL AORTOGRAM W/LOWER EXTREMITY;  Surgeon: Serene Gaile ORN, MD;  Location: [facility];  Service: Cardiovascular;  Laterality: N/A;   ABDOMINAL AORTOGRAM W/LOWER EXTREMITY N/A 11/15/2020   Procedure: ABDOMINAL AORTOGRAM W/LOWER EXTREMITY;  Surgeon: Serene Gaile ORN, MD;  Location: [facility];  Service: Cardiovascular;  Laterality: N/A;   ANKLE SURGERY     right   CARDIAC CATHETERIZATION  09/25/2001   COLONOSCOPY     ~10 yr ago- normal  per pt    CORONARY ARTERY BYPASS GRAFT  09/30/2001   CABG x 4   IR THORACENTESIS ASP PLEURAL SPACE W/IMG GUIDE  05/19/2020   LAPAROSCOPIC INGUINAL HERNIA REPAIR Bilateral 02/09/2010   MASS EXCISION Left 03/12/2013   Procedure: LEFT INDEX EXCISION MASS AND DEBRIDMENT DISTAL INTERPHALANGEAL JOINT;  Surgeon: Franky JONELLE Curia, MD;  Location: [facility];  Service: Orthopedics;  Laterality: Left;   PERIPHERAL VASCULAR BALLOON ANGIOPLASTY Left 08/30/2020   Procedure: PERIPHERAL VASCULAR BALLOON ANGIOPLASTY;  Surgeon: Serene Gaile ORN, MD;  Location: [facility];  Service: Cardiovascular;  Laterality: Left;  Peroneal   TRANSESOPHAGEAL ECHOCARDIOGRAM (CATH LAB) N/A 11/26/2022   Procedure: TRANSESOPHAGEAL ECHOCARDIOGRAM;  Surgeon: Kate Lonni CROME, MD;  Location[facility] INVASIVE CV LAB;  Service: Cardiovascular;  Laterality: N/A;   Social History:  reports that he quit smoking about 3 years ago. His smoking use included cigars. He has never used smokeless tobacco. He reports current alcohol use. He reports that he does not use drugs. Family History:  Family History  Problem Relation Age of Onset   Lymphoma Father    Cancer Paternal Grandmother        Colon Cancer   Colon cancer Paternal Grandmother    Colon polyps Neg Hx    Esophageal cancer Neg Hx    Rectal cancer Neg Hx    Stomach cancer Neg Hx    Diabetes Mellitus I Neg Hx    Pancreatic cancer Neg Hx      HOME MEDICATIONS: Allergies as of 01/09/2023       Reactions    Wool Alcohol [lanolin] Hives, Itching   Wool fabric        Medication List        Accurate as of January 09, 2023 10:49 AM. If you have any questions, ask your nurse or doctor.          acetaminophen  500 MG tablet Commonly known as: TYLENOL  Take 500 mg by mouth as needed for mild pain or moderate pain.   ampicillin  500 MG capsule Commonly known as: PRINCIPEN Take 1 capsule (500 mg total) by mouth 4 (four) times daily for 7 days.   apixaban  5 MG Tabs tablet Commonly known as: Eliquis  Take 1 tablet (5 mg total) by mouth 2 (two) times daily.   aspirin  EC 81 MG tablet Take 1 tablet (81 mg total) by mouth daily. What changed: when to take this   atorvastatin  40 MG tablet Commonly known as: LIPITOR TAKE 1 TABLET BY MOUTH EVERY DAY   Disposable Brief  Large Misc 1 each by Does not apply route as needed.   hydrOXYzine  25 MG tablet Commonly known as: ATARAX  Take 1 tablet (25 mg total) by mouth 3 (three) times daily as needed for anxiety (sleep).   Januvia  50 MG tablet Generic drug: sitaGLIPtin  Take 50 mg by mouth daily.   linagliptin  5 MG Tabs tablet Commonly known as: Tradjenta  Take 1 tablet (5 mg total) by mouth daily.   loperamide  2 MG capsule Commonly known as: IMODIUM  Take 1 capsule (2 mg total) by mouth 4 (four) times daily as needed for diarrhea or loose stools.   losartan  50 MG tablet Commonly known as: COZAAR  Take 1 tablet (50 mg total) by mouth daily.   metFORMIN 500 MG 24 hr tablet Commonly known as: GLUCOPHAGE-XR Take 500 mg by mouth daily.   mirtazapine  15 MG tablet Commonly known as: REMERON  TAKE 1 TABLET BY MOUTH EVERYDAY AT BEDTIME   multivitamin with minerals tablet Take 1 tablet by mouth daily.   PARoxetine  20 MG tablet Commonly known as: PAXIL  TAKE 1 TABLET BY MOUTH EVERY DAY What changed: when to take this   QUEtiapine  25 MG tablet Commonly known as: SEROQUEL  Take 1 tablet (25 mg total) by mouth at bedtime.   spironolactone  25 MG  tablet Commonly known as: ALDACTONE  TAKE 1 TABLET BY MOUTH EVERY DAY         OBJECTIVE:   Vital Signs: There were no vitals taken for this visit.  Wt Readings from Last 3 Encounters:  01/08/23 154 lb (69.9 kg)  01/04/23 148 lb 8 oz (67.4 kg)  01/01/23 148 lb 8 oz (67.4 kg)     Exam: General: Pt appears well and is in NAD  Lungs: Clear with good BS bilat   Heart: RRR   Neuro: MS is good with appropriate affect, pt is alert and Ox3   Dm Foot Exam 09/05/2022 per podiatry      DATA REVIEWED:  Lab Results  Component Value Date   HGBA1C 9.2 (H) 11/30/2022   HGBA1C 9.8 (A) 11/13/2022   HGBA1C 11.2 (A) 06/08/2022    Latest Reference Range & Units 01/06/22 01:01  Sodium 135 - 145 mmol/L 133 (L)  Potassium 3.5 - 5.1 mmol/L 3.5  Chloride 98 - 111 mmol/L 98  CO2 22 - 32 mmol/L 30  Glucose 70 - 99 mg/dL 747 (H)  BUN 8 - 23 mg/dL 17  Creatinine 9.38 - 8.75 mg/dL 9.09  Calcium  8.9 - 10.3 mg/dL 7.9 (L)  Anion gap 5 - 15  5  GFR, Estimated >60 mL/min >60  (L): Data is abnormally low (H): Data is abnormally high   ASSESSMENT / PLAN / RECOMMENDATIONS:   1) Type 2 Diabetes Mellitus, poorly controlled, With retinopathic , neuropathic, retinopathic and macrovascular  complications - Most recent A1c of 9.2 %. Goal A1c < 7.0 %.    -Patient continues with hyperglycemia, he has been off insulin  pump technology since November 2024, he was deemed by hospital staff to be a poor candidate for insulin  pump technology due to noncompliance issues. -Due to history of T2DM he was started on Januvia  and metformin during hospitalization 11/2022 -Patient continues with hyperglycemia, I have recommended discontinuing metformin due to chronic diarrhea -I will increase Januvia  as below -I have also recommended glipizide , because insulin  even if it was once daily injection would cause hardship and son was not sure that the patient will be compliant with this -We will consider increasing glipizide   to twice daily dosing if  hypoglycemia persists   MEDICATIONS: - STOP Metformin  - Increase Januvia  100 mg daily  - Start Glipizide  5 mg, 1 tablet before Breakfast      EDUCATION / INSTRUCTIONS: BG monitoring instructions: Patient is instructed to check his blood sugars before times a day, before each meal and bedtime. Call [place] if: BG persistently < 70  I reviewed the Rule of 15 for the treatment of hypoglycemia in detail with the patient. Literature supplied.     2) Diabetic complications:  Eye: Does  have known diabetic retinopathy.  Neuro/ Feet: Does have known diabetic peripheral neuropathy .  Renal: Patient does not have known baseline CKD. He   is  on an ACEI/ARB at present.     Signed electronically by: Stefano Redgie Butts, MD  [facility]  [facility] [address] [place], [place] [address] Phone: [phone] FAX: [phone]   CC: [name], Mardy HERO, PA-C [address] [place] [address] Phone: [phone]  Fax: [phone]  Return to Endocrinology clinic as below: Future Appointments  Date Time Provider Department Center  01/09/2023 10:50 AM Lumir Demetriou, Donell Redgie, MD [facility] None  01/11/2023  1:00 PM Ezzard Remak D, LCSW [facility] None  01/18/2023  9:30 AM Mollie Nestor HERO, PA-C [facility]  01/29/2023  8:00 AM [name], Mardy HERO, PA-C [facility] PEC  02/08/2023  9:00 AM O'Neal, Darryle Ned, MD [facility] None  02/14/2023  7:30 AM Sherri Mcarthy, Donell Redgie, MD [facility] None  08/15/2023 10:00 AM Sherryl Bouchard, NP [facility] None  12/02/2023  1:40 PM [facility] ANNUAL WELLNESS VISIT 1 [facility] P[facility]

## 2023-01-10 ENCOUNTER — Encounter: Payer: Self-pay | Admitting: Licensed Clinical Social Worker

## 2023-01-10 NOTE — Patient Outreach (Signed)
  Care Coordination   Collaboration  Visit Note   01/08/2023 Name: [name] [name] MRN: [id number] DOB: [birth year]  [name] [name] is a 68 y.o. year old male who sees [name], Mardy HERO, PA-C for primary care. I  engaged with pt's PCP  What matters to the patients health and wellness today?  Patient was not engaged during this encounter    SDOH assessments and interventions completed:  No     Care Coordination Interventions:  Yes, provided  Interventions Today    Flowsheet Row Most Recent Value  Chronic Disease   Chronic disease during today's visit Hypertension (HTN), Atrial Fibrillation (AFib), Congestive Heart Failure (CHF), Diabetes, Other  [MDD]  General Interventions   General Interventions Discussed/Reviewed Communication with  Communication with PCP/Specialists  [LCSW collaborated with PCP regarding patient care barriers/concerns]  Level of Care Assisted Living, Skilled Nursing Facility, Personal Care Services       Follow up plan:  LCSW will f/up within a week    Encounter Outcome:  Patient Visit Completed   Rolin Kerns, MSW, LCSW [facility] Care Management [facility]  [facility] [email or web address] Phone [phone] 5:40 PM

## 2023-01-11 ENCOUNTER — Encounter: Payer: Self-pay | Admitting: Licensed Clinical Social Worker

## 2023-01-13 ENCOUNTER — Other Ambulatory Visit: Payer: Self-pay | Admitting: Physician Assistant

## 2023-01-14 ENCOUNTER — Telehealth: Payer: Self-pay | Admitting: Physician Assistant

## 2023-01-14 NOTE — Telephone Encounter (Signed)
 [employer] would like to make sure we have received paperwork pr the pt and if we can fax the paperwork back with the number in the front as soon as possible.

## 2023-01-14 NOTE — Patient Outreach (Signed)
  Care Coordination   Multidisciplinary Case Review Note    01/10/2023 Name: [name] [name] MRN: [id number] DOB: June 18, 1955  [name] [name] is a 68 y.o. year old male who sees [name], Mardy HERO, PA-C for primary care.  The  multidisciplinary care team met today to review patient care needs and barriers.    Care Coordination Interventions: Multidisciplinary case discussion to review patient ongoing care coordination needs  Patient to be engaged ongoing by LCSW Rolin Kerns to address disease management and social support needs RNCM will f/up if needed  SDOH assessments and interventions completed:  No     Care Coordination Interventions Activated:  Yes   Care Coordination Interventions:  Yes, provided  Interventions Today    Flowsheet Row Most Recent Value  Chronic Disease   Chronic disease during today's visit Hypertension (HTN), Atrial Fibrillation (AFib), Congestive Heart Failure (CHF), Diabetes, Other  [MDD]  General Interventions   General Interventions Discussed/Reviewed Communication with  Communication with RN, Social Work  [place] with team to discuss patient care needs, barriers, and strategies to strengthen support]       Follow up plan: Follow up call scheduled for 2-4 weeks    Multidisciplinary Team Attendees:   Rolin Kerns, LCSW Tobias Moose, BSW, PhD [employer], [facility]  Scribe for Multidisciplinary Case Review:   Rolin Kerns, MSW, LCSW [facility] Care Management [facility]  [facility] [email or web address] Phone [phone] 5:07 PM

## 2023-01-14 NOTE — Patient Instructions (Signed)
 Visit Information  Thank you for taking time to visit with me today. Please don't hesitate to contact me if I can be of assistance to you before our next scheduled telephone appointment.  Following are the goals we discussed today:  (Copy and paste patient goals from clinical care plan here)  Our next appointment is by telephone on 1/31 at 1:30 PM  Please call the care guide team at [phone] if you need to cancel or reschedule your appointment.   If you are experiencing a Mental Health or Behavioral Health Crisis or need someone to talk to, please call the Suicide and Crisis Lifeline: 988 call 911   Patient verbalizes understanding of instructions and care plan provided today and agrees to view in MyChart. Active MyChart status and patient understanding of how to access instructions and care plan via MyChart confirmed with patient.     Merrily Tegeler, MSW, LCSW [facility] Care Management [place]  [facility] [email or web address] Phone [phone] 4:45 PM

## 2023-01-14 NOTE — Patient Outreach (Signed)
  Care Coordination   Follow Up Visit Note   01/11/2023 Name: [name] [name] MRN: [id number] DOB: 1955-03-30  [name] [name] is a 68 y.o. year old male who sees [name], Mardy HERO, PA-C for primary care. I spoke with  [name] [name]'s son, [name] [name], by phone today.  What matters to the patients health and wellness today?  Level of Care, Caregiver Strain, Supportive Resources    Goals Addressed             This Visit's Progress    Obtain Supportive Resources-Caregiver Stress   On track    Activities and task to complete in order to accomplish goals.   Keep all upcoming appointments discussed today Continue with compliance of taking medication prescribed by Doctor Implement healthy coping skills discussed to assist with management of symptoms Continue working with [facility] care team to assist with goals identified         SDOH assessments and interventions completed:  No     Care Coordination Interventions:  Yes, provided  Interventions Today    Flowsheet Row Most Recent Value  Chronic Disease   Chronic disease during today's visit Hypertension (HTN), Atrial Fibrillation (AFib), Congestive Heart Failure (CHF), Diabetes, Other  [MDD]  General Interventions   General Interventions Discussed/Reviewed General Interventions Reviewed, [employer], Doctor Visits, Level of Care  Doctor Visits Discussed/Reviewed Doctor Visits Reviewed  Level of Care --  [facility] continues to provide support to patient and provide updates/concerns to pt's son]  Mental Health Interventions   Mental Health Discussed/Reviewed Mental Health Reviewed, Coping Strategies  [Caregiver Strain]  Nutrition Interventions   Nutrition Discussed/Reviewed Nutrition Reviewed  Pharmacy Interventions   Pharmacy Dicussed/Reviewed Pharmacy Topics Reviewed, Medication Adherence  Safety Interventions   Safety Discussed/Reviewed Safety Reviewed, Fall Risk       Follow up plan: Follow up call  scheduled for 2-4 weeks    Encounter Outcome:  Patient Visit Completed   Rolin Kerns, MSW, LCSW [facility] Care Management [facility] Health  [facility] [email or web address] Phone [phone] 4:44 PM

## 2023-01-15 NOTE — Telephone Encounter (Signed)
 All paperwork received for patient has been from [facility] and faxed back; nothing from [facility]; no number recorded to call back and advise

## 2023-01-18 ENCOUNTER — Encounter: Payer: Self-pay | Admitting: Gastroenterology

## 2023-01-18 ENCOUNTER — Other Ambulatory Visit: Payer: PPO

## 2023-01-18 ENCOUNTER — Ambulatory Visit: Payer: PPO | Admitting: Gastroenterology

## 2023-01-18 ENCOUNTER — Ambulatory Visit
Admission: RE | Admit: 2023-01-18 | Discharge: 2023-01-18 | Disposition: A | Discharge status: Home / Self Care | Payer: PPO | Source: Ambulatory Visit | Attending: Gastroenterology

## 2023-01-18 VITALS — BP 130/82 | HR 68 | Ht 74.5 in | Wt 159.4 lb

## 2023-01-18 DIAGNOSIS — D509 Iron deficiency anemia, unspecified: Secondary | ICD-10-CM

## 2023-01-18 DIAGNOSIS — I639 Cerebral infarction, unspecified: Secondary | ICD-10-CM | POA: Diagnosis not present

## 2023-01-18 DIAGNOSIS — R197 Diarrhea, unspecified: Secondary | ICD-10-CM

## 2023-01-18 DIAGNOSIS — R112 Nausea with vomiting, unspecified: Secondary | ICD-10-CM

## 2023-01-18 DIAGNOSIS — I4891 Unspecified atrial fibrillation: Secondary | ICD-10-CM | POA: Diagnosis not present

## 2023-01-18 DIAGNOSIS — K529 Noninfective gastroenteritis and colitis, unspecified: Secondary | ICD-10-CM

## 2023-01-18 DIAGNOSIS — I5033 Acute on chronic diastolic (congestive) heart failure: Secondary | ICD-10-CM

## 2023-01-18 DIAGNOSIS — Z7901 Long term (current) use of anticoagulants: Secondary | ICD-10-CM

## 2023-01-18 NOTE — Progress Notes (Signed)
Chief Complaint: Chronic diarrhea Primary GI MD: Dr. Russella Dar  HPI: 68 year old male history of CHF, anxiety, diabetes, A-fib on Eliquis, CAD with history of CABG in 2003 iron deficiency anemia, personal history of adenomatous colon polyps, presents for evaluation of chronic diarrhea  Last seen April 2023 by Dr. Russella Dar. At that time he was having alteration of his bowel habits reporting constipation and looser stools with occasional incontinence.  He also had a recent hospitalization was noted to have IDA patient was recommended to do EGD colonoscopy.  Appears this was not performed.  Ct ab pelvis wo contrast 11/24/22 done for vomiting No evidence of bowel obstruction. Moderate bilateral pleural effusions, pulmonary edema or infection. AAA measuring 3.0cm. normal gallbladder.  TEE EF 60-65%  Recent CBC 12/28/22 with hgb 11.6, 91.5 Platelets 261 K 5.4 BUN 24, Cr. 0.97 Alk phos 164  Patient was recently admitted 11/19-11/29/2024 with hypertensive emergency presenting to the ED with BP of 233/102.  Hospitalization was complicated by intermittent delirium and hallucination.  MRI showed watershed infarct.  Stroke was thought to be secondary to noncompliance with Eliquis and hypertension.  Patient was discharged to rehab/skilled nursing facility.  He was also found to have acute on chronic diastolic CHF presenting with volume overload.  -----------------TODAY---------------------------  The patient presents with chronic gastrointestinal issues. He reports a long-standing pattern of loose stools, occurring at least once daily, and describes the consistency as 'a big mess.' The patient denies straining, blood in the stool, and abdominal pain. He also reports occasional nausea and vomiting, which has been ongoing for several years and is not associated with meals.   He also experienced substantial weight loss during his hospitalization and subsequent rehab, dropping from approximately 175 lbs to 150  lbs over a five-week period.  The patient has been taking Imodium as an antidiarrheal, which he feels helps significantly, but he only takes it once every two to three days due to concerns about instigating constipation as he sometimes struggles with. He also reports a history of occasional constipation, but this has not been a significant issue since around 2023  The patient's son, who accompanied him to the appointment, confirmed the patient's reports and provided additional context regarding the patient's recent hospitalization and weight loss. The patient's son also confirmed that the patient has been on Eliquis for a couple of years due to AFib, and this medication was not changed after the recent stroke.   He currently walks with a walker and recently was discharged from rehab following his hospitalization     PREVIOUS GI WORKUP   Colonoscopy 03/2019 - Four 12 to 17 mm polyps in the descending colon and in the transverse colon, removed with a hot snare. Resected and retrieved.  - Four 7 to 8 mm polyps in the sigmoid colon, in the transverse colon and in the ascending colon, removed with a cold snare. Resected and retrieved. - One 18 mm polyp in the sigmoid colon, removed with a hot snare. Resected and retrieved. - Internal hemorrhoids. - The examination was otherwise normal on direct and retroflexion views. Path: tubular adenomas - recall 2 years   Past Medical History:  Diagnosis Date   Anxiety    Arthritis    Cataract    CHF (congestive heart failure) (HCC)    Constipation    pt states goes EOD- hard stools    Coronary artery disease    Depression    Diabetes mellitus without complication (HCC)    Diabetic retinopathy (HCC)  DKA (diabetic ketoacidoses)    Hyperlipidemia    Hypertension    off meds   Hypertensive retinopathy    Non-intractable vomiting without nausea 06/10/2018   Postherpetic neuralgia 08/11/2021   Speculative diagnosis   Psoriasis    Tubular  adenoma of colon 03/2019    Past Surgical History:  Procedure Laterality Date   ABDOMINAL AORTOGRAM W/LOWER EXTREMITY N/A 08/30/2020   Procedure: ABDOMINAL AORTOGRAM W/LOWER EXTREMITY;  Surgeon: Nada Libman, MD;  Location: [facility];  Service: Cardiovascular;  Laterality: N/A;   ABDOMINAL AORTOGRAM W/LOWER EXTREMITY N/A 11/15/2020   Procedure: ABDOMINAL AORTOGRAM W/LOWER EXTREMITY;  Surgeon: Nada Libman, MD;  Location: [facility];  Service: Cardiovascular;  Laterality: N/A;   ANKLE SURGERY     right   CARDIAC CATHETERIZATION  09/25/2001   COLONOSCOPY     ~10 yr ago- normal  per pt    CORONARY ARTERY BYPASS GRAFT  09/30/2001   CABG x 4   IR THORACENTESIS ASP PLEURAL SPACE W/IMG GUIDE  05/19/2020   LAPAROSCOPIC INGUINAL HERNIA REPAIR Bilateral 02/09/2010   MASS EXCISION Left 03/12/2013   Procedure: LEFT INDEX EXCISION MASS AND DEBRIDMENT DISTAL INTERPHALANGEAL JOINT;  Surgeon: Tami Ribas, MD;  Location: [facility];  Service: Orthopedics;  Laterality: Left;   PERIPHERAL VASCULAR BALLOON ANGIOPLASTY Left 08/30/2020   Procedure: PERIPHERAL VASCULAR BALLOON ANGIOPLASTY;  Surgeon: Nada Libman, MD;  Location: [facility];  Service: Cardiovascular;  Laterality: Left;  Peroneal   TRANSESOPHAGEAL ECHOCARDIOGRAM (CATH LAB) N/A 11/26/2022   Procedure: TRANSESOPHAGEAL ECHOCARDIOGRAM;  Surgeon: [name], MD;  Location[facility] CV [facility];  Service: Cardiovascular;  Laterality: N/A;    Current Outpatient Medications  Medication Sig Dispense Refill   acetaminophen (TYLENOL) 500 MG tablet Take 500 mg by mouth as needed for mild pain or moderate pain.     apixaban (ELIQUIS) 5 MG TABS tablet Take 1 tablet (5 mg total) by mouth 2 (two) times daily. 180 tablet 1   aspirin EC 81 MG EC tablet Take 1 tablet (81 mg total) by mouth daily. (Patient taking differently: Take 81 mg by mouth in the morning.) 30 tablet 0   atorvastatin (LIPITOR) 40 MG  tablet TAKE 1 TABLET BY MOUTH EVERY DAY 90 tablet 1   glipiZIDE (GLUCOTROL) 5 MG tablet Take 1 tablet (5 mg total) by mouth daily before breakfast. 90 tablet 3   hydrOXYzine (ATARAX) 25 MG tablet Take 1 tablet (25 mg total) by mouth 3 (three) times daily as needed for anxiety (sleep). 30 tablet 0   Incontinence Supply Disposable (DISPOSABLE BRIEF LARGE) MISC 1 each by Does not apply route as needed. 36 each 5   loperamide (IMODIUM) 2 MG capsule Take 1 capsule (2 mg total) by mouth 4 (four) times daily as needed for diarrhea or loose stools. 30 capsule 1   mirtazapine (REMERON) 15 MG tablet TAKE 1 TABLET BY MOUTH EVERYDAY AT BEDTIME 90 tablet 1   Multiple Vitamins-Minerals (MULTIVITAMIN WITH MINERALS) tablet Take 1 tablet by mouth daily.     PARoxetine (PAXIL) 20 MG tablet TAKE 1 TABLET BY MOUTH EVERY DAY (Patient taking differently: Take 20 mg by mouth in the morning.) 90 tablet 1   QUEtiapine (SEROQUEL) 25 MG tablet Take 1 tablet (25 mg total) by mouth at bedtime. 30 tablet 0   sitaGLIPtin (JANUVIA) 100 MG tablet Take 1 tablet (100 mg total) by mouth daily. 90 tablet 3   spironolactone (ALDACTONE) 25 MG tablet  TAKE 1 TABLET BY MOUTH EVERY DAY 90 tablet 1   losartan (COZAAR) 50 MG tablet Take 1 tablet (50 mg total) by mouth daily. 30 tablet 2   No current facility-administered medications for this visit.    Allergies as of 01/18/2023 - Review Complete 01/18/2023  Allergen Reaction Noted   Wool alcohol [lanolin] Hives and Itching 10/23/2021    Family History  Problem Relation Age of Onset   Lymphoma Father    Cancer Paternal Grandmother        Colon Cancer   Colon cancer Paternal Grandmother    Colon polyps Neg Hx    Esophageal cancer Neg Hx    Rectal cancer Neg Hx    Stomach cancer Neg Hx    Diabetes Mellitus I Neg Hx    Pancreatic cancer Neg Hx     Social History   Socioeconomic History   Marital status: Divorced    Spouse name: Not on file   Number of children: 4   Years of  education: 12   Highest education level: 12th grade  Occupational History   Occupation: Retired  Tobacco Use   Smoking status: Some Days    Types: Cigars    Last attempt to quit: 2022    Years since quitting: 3.0   Smokeless tobacco: Never  Vaping Use   Vaping status: Never Used  Substance and Sexual Activity   Alcohol use: Yes    Comment: about once a year   Drug use: No   Sexual activity: Yes  Other Topics Concern   Not on file  Social History Narrative   Lives with his adult son [name].   Has 4 children, 2 sons- both live in [place], 2 daughters one lives in [place], [place] and youngest daughter attends [employer].    Fiance lives in  [place], [place]   Social Drivers of Health   Financial Resource Strain: Low Risk  (11/21/2022)   Overall Financial Resource Strain (CARDIA)    Difficulty of Paying Living Expenses: Not very hard  Food Insecurity: Food Insecurity Present (11/21/2022)   Hunger Vital Sign    Worried About Running Out of Food in the Last Year: Sometimes true    Ran Out of Food in the Last Year: Sometimes true  Transportation Needs: No Transportation Needs (01/03/2023)   PRAPARE - Administrator, Civil Service (Medical): No    Lack of Transportation (Non-Medical): No  Recent Concern: Transportation Needs - Unmet Transportation Needs (11/21/2022)   PRAPARE - Transportation    Lack of Transportation (Medical): Yes    Lack of Transportation (Non-Medical): Yes  Physical Activity: Inactive (11/21/2022)   Exercise Vital Sign    Days of Exercise per Week: 0 days    Minutes of Exercise per Session: 0 min  Stress: Stress Concern Present (11/21/2022)   [employer] of Occupational Health - Occupational Stress Questionnaire    Feeling of Stress : Very much  Social Connections: Socially Isolated (11/21/2022)   Social Connection and Isolation Panel [NHANES]    Frequency of Communication with Friends and Family: Once a week    Frequency  of Social Gatherings with Friends and Family: Once a week    Attends Religious Services: Never    Database administrator or Organizations: No    Attends Banker Meetings: Never    Marital Status: Divorced  Catering manager Violence: Patient Unable To Answer (11/22/2022)   Humiliation, Afraid, Rape, and Kick questionnaire    Fear  of Current or Ex-Partner: Patient unable to answer    Emotionally Abused: Patient unable to answer    Physically Abused: Patient unable to answer    Sexually Abused: Patient unable to answer    Review of Systems:    Constitutional: No weight loss, fever, chills, weakness or fatigue HEENT: Eyes: No change in vision               Ears, Nose, Throat:  No change in hearing or congestion Skin: No rash or itching Cardiovascular: No chest pain, chest pressure or palpitations   Respiratory: No SOB or cough Gastrointestinal: See HPI and otherwise negative Genitourinary: No dysuria or change in urinary frequency Neurological: No headache, dizziness or syncope Musculoskeletal: No new muscle or joint pain Hematologic: No bleeding or bruising Psychiatric: No history of depression or anxiety    Physical Exam:  Vital signs: BP 130/82 (BP Location: Right Arm, Patient Position: Sitting, Cuff Size: Normal)   Pulse 68   Ht 6' 2.5" (1.892 m)   Wt 159 lb 6 oz (72.3 kg)   SpO2 96%   BMI 20.19 kg/m   Constitutional: NAD, chronically ill-appearing, weak, walks with walker  head:  Normocephalic and atraumatic. Eyes:   PEERL, EOMI. No icterus. Conjunctiva pink. Respiratory: Respirations even and unlabored. Lungs clear to auscultation bilaterally.   No wheezes, crackles, or rhonchi.  Cardiovascular:  Regular rate and rhythm. No peripheral edema, cyanosis or pallor.  Gastrointestinal:  Soft, nondistended, nontender. No rebound or guarding. Normal bowel sounds. No appreciable masses or hepatomegaly. Rectal:  Not performed. Patient declined Msk:  Symmetrical  without gross deformities. Without edema, no deformity or joint abnormality.  Neurologic:  Alert and  oriented x4;  grossly normal neurologically.  Skin:   Dry and intact without significant lesions or rashes. Psychiatric: Oriented to person, place and time. Demonstrates good judgement and reason without abnormal affect or behaviors.   RELEVANT LABS AND IMAGING: CBC    Component Value Date/Time   WBC 9.1 12/28/2022 1346   RBC 3.84 (L) 12/28/2022 1346   HGB 11.6 (L) 12/28/2022 1346   HCT 35.1 (L) 12/28/2022 1346   PLT 261.0 12/28/2022 1346   MCV 91.5 12/28/2022 1346   MCH 29.3 11/30/2022 0250   MCHC 33.2 12/28/2022 1346   RDW 15.4 12/28/2022 1346   LYMPHSABS 1.2 12/28/2022 1346   MONOABS 0.5 12/28/2022 1346   EOSABS 0.1 12/28/2022 1346   BASOSABS 0.1 12/28/2022 1346    CMP     Component Value Date/Time   NA 138 12/28/2022 1346   K 5.4 No hemolysis seen (H) 12/28/2022 1346   CL 99 12/28/2022 1346   CO2 31 12/28/2022 1346   GLUCOSE 204 (H) 12/28/2022 1346   BUN 34 (H) 12/28/2022 1346   CREATININE 0.97 12/28/2022 1346   CALCIUM 8.8 12/28/2022 1346   PROT 6.6 12/28/2022 1346   ALBUMIN 3.5 12/28/2022 1346   AST 21 12/28/2022 1346   ALT 15 12/28/2022 1346   ALKPHOS 164 (H) 12/28/2022 1346   BILITOT 0.5 12/28/2022 1346   GFRNONAA 50 (L) 11/30/2022 0250   GFRAA >60 06/09/2018 1647     Assessment/Plan:   68 year old male history of CHF, anxiety, diabetes, A-fib on Eliquis, CAD with history of CABG in 2003 iron deficiency anemia, personal history of adenomatous colon polyps, with recent hospitalization in November secondary to hypertensive urgency and found to have watershed stroke in setting of noncompliance with Eliquis and hypertension in addition to CHF exacerbation with volume overload discharged to  a rehab facility now presents for chronic diarrhea    Chronic Diarrhea Daily loose stools for years with occasional incontinence. No abdominal pain, no blood in stool.  Occasionally constipation. Currently on Imodium with some relief.  Difficult historian when describing his bowel movements.  He is unsure if they are greasy/oily or how many per day. --Order stool studies to rule out infection, especially C. diff due to recent hospitalization. --Order abdominal x-ray to rule out constipation. -- patient declines labs today. Last TSH 11/2022 4.39. - Pancreatic fecal elastase --Continue Imodium as needed. --Add fiber supplement to diet. --Follow-up in 4-8 weeks.   -- Patient will ultimately benefit from EGD/colonoscopy as originally scheduled at last appointment.  However, with recent history of watershed stroke November 2024 would have to wait and least 6 months since his stroke prior to considering endoscopic procedures and this would be done at the hospital setting requiring cessation of his Eliquis.  He is very high risk for procedures but a colonoscopy would be beneficial to rule out microscopic colitis and with his significant colon polyps found on colonoscopy in 2021 with recommendation of 2-year repeat.  Iron Deficiency Anemia History of iron deficiency anemia. No recent endoscopy/colonoscopy performed.  Currently stable.  No overt bleeding. --Plan for endoscopy/colonoscopy in the future therapy 6 months to 1 year, pending cardiologist approval for holding Eliquis at that time.  He is currently still recovering and weak walking with a walker so it would be difficult for him to tolerate a colon prep. --Perform procedure in hospital setting for extra precaution.  Atrial Fibrillation On Eliquis for stroke prevention. --coordinate with cardiologist regarding holding Eliquis for future endoscopy/colonoscopy.  CVA Noted to have watershed stroke in the setting of noncompliance with Eliquis and hypertensive urgency November 2024.  Currently remains on Eliquis. - Any endoscopic evaluation would have to be minimum 6 months from November 2024 (May 2025 at the very  earliest) in a hospital setting and he would have to have a blood thinner cease.  Patient is high risk for procedures.  Close follow-up in 4-8 weeks to assess response to treatment and consider plan for endoscopy/colonoscopy.      Assigned to Dr. Adela Lank today  Boone Master, PA-C [facility] 01/18/2023, 10:12 AM  Cc: [name], [name], PA-C

## 2023-01-18 NOTE — Patient Instructions (Addendum)
_______________________________________________________  If your blood pressure at your visit was 140/90 or greater, please contact your primary care physician to follow up on this.  _______________________________________________________  If you are age 70 or older, your body mass index should be between 23-30. Your Body mass index is 20.19 kg/m. If this is out of the aforementioned range listed, please consider follow up with your Primary Care Provider.  If you are age 25 or younger, your body mass index should be between 19-25. Your Body mass index is 20.19 kg/m. If this is out of the aformentioned range listed, please consider follow up with your Primary Care Provider.   ________________________________________________________  The [place] [facility] providers would like to encourage you to use [facility] to communicate with providers for non-urgent requests or questions.  Due to long hold times on the telephone, sending your provider a message by [facility] may be a faster and more efficient way to get a response.  Please allow 48 business hours for a response.  Please remember that this is for non-urgent requests.  _______________________________________________________  Your provider has requested that you have an abdominal x ray before leaving today. Please go to the basement floor to our Radiology department for the test.  Your provider has requested that you go to the basement level for lab work before leaving today. Press "B" on the elevator. The lab is located at the first door on the left as you exit the elevator.   You have been scheduled for an appointment with Boone Master PA-C on 03-18-23 at 920am. Please arrive 10 minutes early for your appointment.  Your provider has ordered "[employer]athe[employer]" stool testing for you. You have received a kit from our office today containing all necessary supplies to complete this test. Please carefully read the stool collection instructions provided in the kit  before opening the accompanying materials. In addition, be sure to place the label from the top right corner of the laboratory request sheet onto the "puritan opti-swab" tube that is supplied in the kit. This label should include your full name and date of birth. After completing the test, you should secure the purtian tube into the specimen biohazard bag. The laboratory request information sheet (including date and time of specimen collection) should be placed into the outside pocket of the specimen biohazard bag and returned to the [place] lab with 2 days of collection.   If the laboratory information sheet specimen date and time are not filled out, the test will NOT be performed.  It was a pleasure to see you today!  Thank you for trusting me with your gastrointestinal care!

## 2023-01-18 NOTE — Progress Notes (Signed)
Agree with assessment and plan as outlined.  Will await stool studies first.  Agree that he does need an EGD and colonoscopy the question is timing given his comorbidities.  I do think it may be best to see him in the office in 1 to 2 months for reassessment and discuss at that time how he wants to proceed.  Need to wait at least 3 months from his last CVA, but not necessarily 6 months.

## 2023-01-21 ENCOUNTER — Other Ambulatory Visit: Payer: Self-pay | Admitting: Physician Assistant

## 2023-01-21 DIAGNOSIS — F3341 Major depressive disorder, recurrent, in partial remission: Secondary | ICD-10-CM

## 2023-01-21 NOTE — Telephone Encounter (Signed)
Please see pt refill request and advise on refill requests

## 2023-01-22 ENCOUNTER — Telehealth: Payer: Self-pay | Admitting: Physician Assistant

## 2023-01-22 NOTE — Telephone Encounter (Signed)
Returned [place] call and advised all paperwork that has been received has been faxed back to fax number [phone] as stated in prev telephone encounter. Confirmed correct fax number and this has indeed been received. Nothing further needed per [place].

## 2023-01-22 NOTE — Telephone Encounter (Unsigned)
Copied from CRM [id number]. Topic: General - Other >> Jan 22, 2023  9:13 AM Gurney Maxin H wrote: Reason for CRM: Belva Chimes is calling to follow up on the prior authorization form for custodial care for patient, calling to see if form was completed and returned to [phone]  Belva Chimes [phone]

## 2023-01-24 ENCOUNTER — Ambulatory Visit: Payer: Self-pay | Admitting: Physician Assistant

## 2023-01-24 NOTE — Telephone Encounter (Signed)
Information obtained from son [name].    Chief Complaint: unwitnessed fall, HTN Symptoms: Nausea Frequency: 1300 today Pertinent Negatives: Patient denies hitting head, new vision changes, HA, dizziness, CMS changes,   Disposition: [x] ED /[] Urgent Care (no appt availability in office) / [] Appointment(In office/virtual)/ []  [place] [facility]/ [] Home Care/ [x] Refused Recommended Disposition /[] [facility]/ []  Follow-up with PCP  Additional Notes: Pt was found by home health worker on the floor at about 1500 today. Per son the pt was not seen on video in the home after 1300, per the son he believes his father fell sometime between 1300-1500. Pt states that he feels he tripped on his bed. Pt denies hitting his head and denies LOC. Per son has been having weakness and that is why the pt could not get up on his own. Pt does take blood thinner. At this time pt advised per RN to go to ED, however pt declines. RN spoke directly to pt to explain potential risks of not going to the ED, pt still refusing to go to ED, "I might go tomorrow, I just want to be in my own home". Pt denies nausea when speaking to writer, but previously asked [facility] RN and son at location with him for an emesis basin. Son is also worried about his HTN. 2 readings were taken 15 minutes apart: 160/100 and 160/102.  Son confirmed that pt did not miss any BP medication doses.   Copied from CRM [id number]. Topic: Clinical - Red Word Triage >> Jan 24, 2023  3:45 PM Orinda Kenner C wrote: Red Word that prompted transfer to Nurse Triage: Patient's son [name] [phone] patient had homehealth medical visitor came to patient's home and found patient on the floor for a couple of hours, blood 160/102 and 15 minutes later 160/100. Patient is feeling nausea like about to throw up, slight dizziness, blurry vision eyes are not dilated or pin point. Patient denies no headaches, light headness, shortness of breath, or fever. Patient doesn't know if  he fell and hit his head, and does not remember losing conscious. Reason for Disposition  Systolic BP  >= 160 OR Diastolic >= 100  Sounds like a serious injury to the triager  Answer Assessment - Initial Assessment Questions 1. MECHANISM: "How did the fall happen?"     Pt stated that he tripped over the bed.  3. ONSET: "When did the fall happen?" (e.g., minutes, hours, or days ago)     About 1300 today, was found about 1500, was down for about 2 hours total 4. LOCATION: "What part of the body hit the ground?" (e.g., back, buttocks, head, hips, knees, hands, head, stomach)     Leg, hip,  5. INJURY: "Did you hurt (injure) yourself when you fell?" If Yes, ask: "What did you injure? Tell me more about this?" (e.g., body area; type of injury; pain severity)"     Unknown about injury 6. PAIN: "Is there any pain?" If Yes, ask: "How bad is the pain?" (e.g., Scale 1-10; or mild,  moderate, severe)   - NONE (0): No pain   - MILD (1-3): Doesn't interfere with normal activities    - MODERATE (4-7): Interferes with normal activities or awakens from sleep    - SEVERE (8-10): Excruciating pain, unable to do any normal activities      denies 7. SIZE: For cuts, bruises, or swelling, ask: "How large is it?" (e.g., inches or centimeters)      denies 9. OTHER SYMPTOMS: "Do you have  any other symptoms?" (e.g., dizziness, fever, weakness; new onset or worsening).      denies 10. CAUSE: "What do you think caused the fall (or falling)?" (e.g., tripped, dizzy spell)       tripped  Answer Assessment - Initial Assessment Questions 1. BLOOD PRESSURE: "What is the blood pressure?" "Did you take at least two measurements 5 minutes apart?"     160/100, 160/102 2. ONSET: "When did you take your blood pressure?"     Today 15 minutes apart 3. HOW: "How did you take your blood pressure?" (e.g., automatic home BP monitor, visiting nurse)    manual 4. HISTORY: "Do you have a history of high blood pressure?"     Hx of  HTN taking medications,  5. MEDICINES: "Are you taking any medicines for blood pressure?" "Have you missed any doses recently?"     Has not missed any doses 6. OTHER SYMPTOMS: "Do you have any symptoms?" (e.g., blurred vision, chest pain, difficulty breathing, headache, weakness)     No new/worsening s/s,  Protocols used: Falls and Falling-A-AH, Blood Pressure - High-A-AH

## 2023-01-24 NOTE — Telephone Encounter (Signed)
Noted. Agree with ER recommendation. Patient's choice to refuse.

## 2023-01-24 NOTE — Telephone Encounter (Signed)
Please see triage note; pt refused ED

## 2023-01-25 ENCOUNTER — Ambulatory Visit: Payer: Self-pay | Admitting: Physician Assistant

## 2023-01-25 NOTE — Telephone Encounter (Signed)
1st attempt to reach pt, LMOM to return call to office. Routing to call back.  Copied from CRM [id number]. Topic: Clinical - Red Word Triage >> Jan 25, 2023  4:18 PM Almira Coaster wrote: Red Word that prompted transfer to Nurse Triage: Nolberto Hanlon nurse case manager from [facility] is calling to report a patient fall that happened yesterday. Patient fell left shoulder and left hip. Patient denied any sharp pain but is reporting soreness in both areas.

## 2023-01-25 NOTE — Telephone Encounter (Signed)
  Chief Complaint: fall Symptoms: tripped on a bedpost according to patient Frequency: occurred yesterday Pertinent Negatives: Patient denies CP, SOB Disposition: [] ED /[] Urgent Care (no appt availability in office) / [] Appointment(In office/virtual)/ []  [facility]/ [x] Home Care/ [] Refused Recommended Disposition /[] [facility]/ []  Follow-up with PCP Additional Notes: Son calling back-states patient was found on the ground yesterday but Home Care visit. Patient states that he tripped on a bed post and couldn't get up. Son states patient was told by [facility] that he probably needed to be checked out by ED. Patient refused. Patient without any injuries-c/o soreness. Home care appropriate recommendation at this time. Patient's son verbalized understanding of plan and all questions answered.    Reason for Disposition  [1] Recent fall AND [2] no injury  Answer Assessment - Initial Assessment Questions 1. MECHANISM: "How did the fall happen?"     Unsure of how he fell but patient was found on the floor 2. DOMESTIC VIOLENCE AND ELDER ABUSE SCREENING: "Did you fall because someone pushed you or tried to hurt you?" If Yes, ask: "Are you safe now?"     No 3. ONSET: "When did the fall happen?" (e.g., minutes, hours, or days ago)    Happened yesterday 4. LOCATION: "What part of the body hit the ground?" (e.g., back, buttocks, head, hips, knees, hands, head, stomach)     Soreness to left shoulder and left hip 5. INJURY: "Did you hurt (injure) yourself when you fell?" If Yes, ask: "What did you injure? Tell me more about this?" (e.g., body area; type of injury; pain severity)"     No 6. PAIN: "Is there any pain?" If Yes, ask: "How bad is the pain?" (e.g., Scale 1-10; or mild,  moderate, severe)   - NONE (0): No pain   - MILD (1-3): Doesn't interfere with normal activities    - MODERATE (4-7): Interferes with normal activities or awakens from sleep    - SEVERE (8-10):  Excruciating pain, unable to do any normal activities      Mild 7. SIZE: For cuts, bruises, or swelling, ask: "How large is it?" (e.g., inches or centimeters)      no 9. OTHER SYMPTOMS: "Do you have any other symptoms?" (e.g., dizziness, fever, weakness; new onset or worsening).     No 10. CAUSE: "What do you think caused the fall (or falling)?" (e.g., tripped, dizzy spell)       Tripped on a bedpost in his room.  Protocols used: Falls and [facility]

## 2023-01-28 NOTE — Telephone Encounter (Signed)
Noted, appt 01/29/2023.

## 2023-01-29 ENCOUNTER — Ambulatory Visit: Payer: PPO | Admitting: Physician Assistant

## 2023-01-30 NOTE — Telephone Encounter (Signed)
Please see pt son message and advise, pt cancelled previous appt yesterday. Please advise on refill or advise pt needs appt with a provider for evaluation

## 2023-01-31 NOTE — Telephone Encounter (Signed)
Please see recommendations from PCP and schedule patient with any provider who has openings or pt will need to go to ED if not willing to see another provider.

## 2023-02-01 ENCOUNTER — Encounter: Payer: Self-pay | Admitting: Physician Assistant

## 2023-02-01 ENCOUNTER — Emergency Department (HOSPITAL_COMMUNITY): Payer: PPO

## 2023-02-01 ENCOUNTER — Ambulatory Visit (INDEPENDENT_AMBULATORY_CARE_PROVIDER_SITE_OTHER): Payer: PPO | Admitting: Physician Assistant

## 2023-02-01 ENCOUNTER — Inpatient Hospital Stay (HOSPITAL_COMMUNITY)
Admission: EM | Admit: 2023-02-01 | Discharge: 2023-02-07 | DRG: 064 | Disposition: A | Discharge status: Home Health | Payer: PPO | Source: Ambulatory Visit | Attending: Student | Admitting: Student

## 2023-02-01 ENCOUNTER — Ambulatory Visit: Payer: Self-pay | Admitting: Licensed Clinical Social Worker

## 2023-02-01 ENCOUNTER — Other Ambulatory Visit: Payer: Self-pay

## 2023-02-01 VITALS — BP 160/80 | HR 74 | Temp 97.7°F | Ht 74.5 in | Wt 168.0 lb

## 2023-02-01 DIAGNOSIS — R54 Age-related physical debility: Secondary | ICD-10-CM | POA: Diagnosis present

## 2023-02-01 DIAGNOSIS — F32A Depression, unspecified: Secondary | ICD-10-CM | POA: Diagnosis present

## 2023-02-01 DIAGNOSIS — Z8673 Personal history of transient ischemic attack (TIA), and cerebral infarction without residual deficits: Secondary | ICD-10-CM | POA: Diagnosis present

## 2023-02-01 DIAGNOSIS — N179 Acute kidney failure, unspecified: Secondary | ICD-10-CM | POA: Diagnosis present

## 2023-02-01 DIAGNOSIS — D638 Anemia in other chronic diseases classified elsewhere: Secondary | ICD-10-CM | POA: Diagnosis present

## 2023-02-01 DIAGNOSIS — J90 Pleural effusion, not elsewhere classified: Secondary | ICD-10-CM

## 2023-02-01 DIAGNOSIS — I110 Hypertensive heart disease with heart failure: Secondary | ICD-10-CM | POA: Diagnosis present

## 2023-02-01 DIAGNOSIS — Z91199 Patient's noncompliance with other medical treatment and regimen due to unspecified reason: Secondary | ICD-10-CM

## 2023-02-01 DIAGNOSIS — Z91148 Patient's other noncompliance with medication regimen for other reason: Secondary | ICD-10-CM

## 2023-02-01 DIAGNOSIS — E875 Hyperkalemia: Secondary | ICD-10-CM | POA: Diagnosis present

## 2023-02-01 DIAGNOSIS — E785 Hyperlipidemia, unspecified: Secondary | ICD-10-CM | POA: Diagnosis present

## 2023-02-01 DIAGNOSIS — I959 Hypotension, unspecified: Secondary | ICD-10-CM | POA: Diagnosis not present

## 2023-02-01 DIAGNOSIS — F39 Unspecified mood [affective] disorder: Secondary | ICD-10-CM | POA: Diagnosis present

## 2023-02-01 DIAGNOSIS — E119 Type 2 diabetes mellitus without complications: Secondary | ICD-10-CM

## 2023-02-01 DIAGNOSIS — E1165 Type 2 diabetes mellitus with hyperglycemia: Secondary | ICD-10-CM | POA: Diagnosis present

## 2023-02-01 DIAGNOSIS — I5033 Acute on chronic diastolic (congestive) heart failure: Secondary | ICD-10-CM | POA: Diagnosis present

## 2023-02-01 DIAGNOSIS — I5032 Chronic diastolic (congestive) heart failure: Secondary | ICD-10-CM

## 2023-02-01 DIAGNOSIS — R29701 NIHSS score 1: Secondary | ICD-10-CM | POA: Diagnosis not present

## 2023-02-01 DIAGNOSIS — R809 Proteinuria, unspecified: Secondary | ICD-10-CM | POA: Diagnosis present

## 2023-02-01 DIAGNOSIS — I63511 Cerebral infarction due to unspecified occlusion or stenosis of right middle cerebral artery: Secondary | ICD-10-CM | POA: Diagnosis not present

## 2023-02-01 DIAGNOSIS — Z7901 Long term (current) use of anticoagulants: Secondary | ICD-10-CM

## 2023-02-01 DIAGNOSIS — Z87440 Personal history of urinary (tract) infections: Secondary | ICD-10-CM

## 2023-02-01 DIAGNOSIS — E1151 Type 2 diabetes mellitus with diabetic peripheral angiopathy without gangrene: Secondary | ICD-10-CM | POA: Diagnosis present

## 2023-02-01 DIAGNOSIS — R443 Hallucinations, unspecified: Secondary | ICD-10-CM | POA: Diagnosis not present

## 2023-02-01 DIAGNOSIS — R001 Bradycardia, unspecified: Secondary | ICD-10-CM | POA: Diagnosis present

## 2023-02-01 DIAGNOSIS — N400 Enlarged prostate without lower urinary tract symptoms: Secondary | ICD-10-CM | POA: Diagnosis present

## 2023-02-01 DIAGNOSIS — I160 Hypertensive urgency: Secondary | ICD-10-CM

## 2023-02-01 DIAGNOSIS — J918 Pleural effusion in other conditions classified elsewhere: Secondary | ICD-10-CM | POA: Diagnosis present

## 2023-02-01 DIAGNOSIS — M7989 Other specified soft tissue disorders: Secondary | ICD-10-CM

## 2023-02-01 DIAGNOSIS — M1990 Unspecified osteoarthritis, unspecified site: Secondary | ICD-10-CM | POA: Diagnosis present

## 2023-02-01 DIAGNOSIS — Z79899 Other long term (current) drug therapy: Secondary | ICD-10-CM

## 2023-02-01 DIAGNOSIS — I2510 Atherosclerotic heart disease of native coronary artery without angina pectoris: Secondary | ICD-10-CM | POA: Diagnosis present

## 2023-02-01 DIAGNOSIS — I2489 Other forms of acute ischemic heart disease: Secondary | ICD-10-CM | POA: Diagnosis present

## 2023-02-01 DIAGNOSIS — I639 Cerebral infarction, unspecified: Secondary | ICD-10-CM | POA: Diagnosis present

## 2023-02-01 DIAGNOSIS — E861 Hypovolemia: Secondary | ICD-10-CM | POA: Diagnosis not present

## 2023-02-01 DIAGNOSIS — I509 Heart failure, unspecified: Secondary | ICD-10-CM

## 2023-02-01 DIAGNOSIS — R441 Visual hallucinations: Secondary | ICD-10-CM | POA: Diagnosis not present

## 2023-02-01 DIAGNOSIS — F17290 Nicotine dependence, other tobacco product, uncomplicated: Secondary | ICD-10-CM | POA: Diagnosis present

## 2023-02-01 DIAGNOSIS — Z9181 History of falling: Secondary | ICD-10-CM

## 2023-02-01 DIAGNOSIS — Z951 Presence of aortocoronary bypass graft: Secondary | ICD-10-CM

## 2023-02-01 DIAGNOSIS — E11319 Type 2 diabetes mellitus with unspecified diabetic retinopathy without macular edema: Secondary | ICD-10-CM | POA: Diagnosis present

## 2023-02-01 DIAGNOSIS — R4182 Altered mental status, unspecified: Principal | ICD-10-CM

## 2023-02-01 DIAGNOSIS — T502X5A Adverse effect of carbonic-anhydrase inhibitors, benzothiadiazides and other diuretics, initial encounter: Secondary | ICD-10-CM | POA: Diagnosis not present

## 2023-02-01 DIAGNOSIS — Z860101 Personal history of adenomatous and serrated colon polyps: Secondary | ICD-10-CM

## 2023-02-01 DIAGNOSIS — Z7984 Long term (current) use of oral hypoglycemic drugs: Secondary | ICD-10-CM

## 2023-02-01 DIAGNOSIS — E871 Hypo-osmolality and hyponatremia: Secondary | ICD-10-CM | POA: Diagnosis present

## 2023-02-01 DIAGNOSIS — Z7982 Long term (current) use of aspirin: Secondary | ICD-10-CM

## 2023-02-01 DIAGNOSIS — L409 Psoriasis, unspecified: Secondary | ICD-10-CM | POA: Diagnosis present

## 2023-02-01 DIAGNOSIS — L89153 Pressure ulcer of sacral region, stage 3: Secondary | ICD-10-CM | POA: Diagnosis present

## 2023-02-01 DIAGNOSIS — F419 Anxiety disorder, unspecified: Secondary | ICD-10-CM | POA: Diagnosis present

## 2023-02-01 DIAGNOSIS — R29700 NIHSS score 0: Secondary | ICD-10-CM | POA: Diagnosis not present

## 2023-02-01 DIAGNOSIS — I480 Paroxysmal atrial fibrillation: Secondary | ICD-10-CM | POA: Diagnosis present

## 2023-02-01 DIAGNOSIS — E876 Hypokalemia: Secondary | ICD-10-CM | POA: Diagnosis present

## 2023-02-01 DIAGNOSIS — Z9109 Other allergy status, other than to drugs and biological substances: Secondary | ICD-10-CM

## 2023-02-01 LAB — BASIC METABOLIC PANEL
Anion gap: 10 (ref 5–15)
BUN: 14 mg/dL (ref 8–23)
CO2: 25 mmol/L (ref 22–32)
Calcium: 8.5 mg/dL — ABNORMAL LOW (ref 8.9–10.3)
Chloride: 101 mmol/L (ref 98–111)
Creatinine, Ser: 0.87 mg/dL (ref 0.61–1.24)
GFR, Estimated: >60 mL/min (ref >60)
Glucose, Bld: 106 mg/dL — ABNORMAL HIGH (ref 70–99)
Potassium: 3.6 mmol/L (ref 3.5–5.1)
Sodium: 136 mmol/L (ref 135–145)

## 2023-02-01 LAB — URINALYSIS, ROUTINE W REFLEX MICROSCOPIC
Bacteria, UA: NONE SEEN
Bilirubin Urine: NEGATIVE
Glucose, UA: 50 mg/dL — AB
Ketones, ur: NEGATIVE mg/dL
Leukocytes,Ua: NEGATIVE
Nitrite: NEGATIVE
Protein, ur: >=300 mg/dL — AB
Specific Gravity, Urine: 1.017 (ref 1.005–1.030)
pH: 5 (ref 5.0–8.0)

## 2023-02-01 LAB — CBC
HCT: 31.7 % — ABNORMAL LOW (ref 39.0–52.0)
Hemoglobin: 10.1 g/dL — ABNORMAL LOW (ref 13.0–17.0)
MCH: 29.8 pg (ref 26.0–34.0)
MCHC: 31.9 g/dL (ref 30.0–36.0)
MCV: 93.5 fL (ref 80.0–100.0)
Platelets: 227 10*3/uL (ref 150–400)
RBC: 3.39 MIL/uL — ABNORMAL LOW (ref 4.22–5.81)
RDW: 15.6 % — ABNORMAL HIGH (ref 11.5–15.5)
WBC: 5.5 10*3/uL (ref 4.0–10.5)
nRBC: 0 % (ref 0.0–0.2)

## 2023-02-01 LAB — TROPONIN I (HIGH SENSITIVITY)
Troponin I (High Sensitivity): 29 ng/L — ABNORMAL HIGH (ref <18)
Troponin I (High Sensitivity): 27 ng/L — ABNORMAL HIGH (ref <18)

## 2023-02-01 LAB — POCT GLUCOSE (DEVICE FOR HOME USE): Glucose Fasting, POC: 214 mg/dL — AB (ref 70–99)

## 2023-02-01 LAB — BRAIN NATRIURETIC PEPTIDE: B Natriuretic Peptide: 457.8 pg/mL — ABNORMAL HIGH (ref 0.0–100.0)

## 2023-02-01 MED ORDER — LORAZEPAM 2 MG/ML IJ SOLN
0.5000 mg | Freq: Once | INTRAMUSCULAR | Status: AC
  Administered 2023-02-01 – 2023-02-02 (×2): 0.5 mg via INTRAVENOUS
  Filled 2023-02-01 (×2): qty 1

## 2023-02-01 MED ORDER — LABETALOL HCL 5 MG/ML IV SOLN
10.0000 mg | Freq: Once | INTRAVENOUS | Status: AC
  Administered 2023-02-01: 10 mg via INTRAVENOUS
  Filled 2023-02-01: qty 4

## 2023-02-01 MED ORDER — FUROSEMIDE 10 MG/ML IJ SOLN
40.0000 mg | Freq: Once | INTRAMUSCULAR | Status: AC
  Administered 2023-02-01: 40 mg via INTRAVENOUS
  Filled 2023-02-01: qty 4

## 2023-02-01 NOTE — ED Provider Notes (Signed)
[place] EMERGENCY DEPARTMENT AT [facility] Provider Note   CSN: [id number] Arrival date & time: 02/01/23  1425     History {Add pertinent medical, surgical, social history, OB history to HPI:1} Chief Complaint  Patient presents with   Altered Mental Status    [name] [name] is a 68 y.o. male.  HPI     Began to notice shortness of breath today, lower legs and feet    Past Medical History:  Diagnosis Date   Anxiety    Arthritis    Cataract    CHF (congestive heart failure) (HCC)    Constipation    pt states goes EOD- hard stools    Coronary artery disease    Depression    Diabetes mellitus without complication (HCC)    Diabetic retinopathy (HCC)    DKA (diabetic ketoacidoses)    Hyperlipidemia    Hypertension    off meds   Hypertensive retinopathy    Non-intractable vomiting without nausea 06/10/2018   Postherpetic neuralgia 08/11/2021   Speculative diagnosis   Psoriasis    Tubular adenoma of colon 03/2019     Home Medications Prior to Admission medications   Medication Sig Start Date End Date Taking? Authorizing Provider  acetaminophen (TYLENOL) 500 MG tablet Take 500 mg by mouth as needed for mild pain or moderate pain.   Yes [provider]  apixaban (ELIQUIS) 5 MG TABS tablet Take 1 tablet (5 mg total) by mouth 2 (two) times daily. 03/02/22  Yes O'Neal, Ronnald Ramp, MD  aspirin EC 81 MG EC tablet Take 1 tablet (81 mg total) by mouth daily. Patient taking differently: Take 81 mg by mouth in the morning. 02/18/21  Yes Zannie Cove, MD  atorvastatin (LIPITOR) 40 MG tablet TAKE 1 TABLET BY MOUTH EVERY DAY Patient taking differently: Take 40 mg by mouth at bedtime. 09/17/22  Yes [name], [name], PA-C  Cholecalciferol 25 MCG (1000 UT) capsule Take 1,000 Units by mouth daily.   Yes [provider]  glipiZIDE (GLUCOTROL) 5 MG tablet Take 1 tablet (5 mg total) by mouth daily before breakfast. 01/09/23  Yes Shamleffer, Konrad Dolores, MD  hydrOXYzine (ATARAX) 25 MG tablet Take 1 tablet (25 mg total) by mouth 3 (three) times daily as needed for anxiety (sleep). Patient taking differently: Take 25 mg by mouth daily as needed for anxiety (sleep). 11/30/22  Yes Noralee Stain, DO  loperamide (IMODIUM) 2 MG capsule Take 1 capsule (2 mg total) by mouth 4 (four) times daily as needed for diarrhea or loose stools. 01/01/23  Yes Jeani Sow, MD  losartan (COZAAR) 50 MG tablet TAKE I TABLET BY MOUTH ONCE A DAY FOR HYPERTENSION 01/22/23  Yes [name], [name], PA-C  mirtazapine (REMERON) 15 MG tablet TAKE 1 TABLET BY MOUTH EVERYDAY AT BEDTIME 04/19/22  Yes [name], [name], PA-C  Multiple Vitamins-Minerals (MULTIVITAMIN WITH MINERALS) tablet Take 1 tablet by mouth daily.   Yes [provider]  PARoxetine (PAXIL) 20 MG tablet TAKE 1 TABLET ONCE A DAY FOR DEPRESSION 01/22/23  Yes [name], [name], PA-C  QUEtiapine (SEROQUEL) 25 MG tablet GIVE 1 TABLET BY MOUTH AT BEDTIME FOR DELIRIUM 01/22/23  Yes [name], [name], PA-C  sitaGLIPtin (JANUVIA) 100 MG tablet Take 1 tablet (100 mg total) by mouth daily. 01/09/23  Yes Shamleffer, Konrad Dolores, MD  spironolactone (ALDACTONE) 25 MG tablet TAKE 1 TABLET BY MOUTH EVERY DAY 12/03/22  Yes [name], [name], PA-C      Allergies  Wool alcohol [lanolin]    Review of Systems   Review of Systems  Physical Exam Updated Vital Signs BP (!) 213/105 (BP Location: Right Arm)   Pulse 66   Temp 98 F (36.7 C) (Oral)   Resp 18   Ht 6\' 5"  (1.956 m)   Wt 76.2 kg   SpO2 96%   BMI 19.92 kg/m  Physical Exam  ED Results / Procedures / Treatments   Labs (all labs ordered are listed, but only abnormal results are displayed) Labs Reviewed  BASIC METABOLIC PANEL - Abnormal; Notable for the following components:      Result Value   Glucose, Bld 106 (*)    Calcium 8.5 (*)    All other components within normal limits  CBC - Abnormal; Notable for the following components:    RBC 3.39 (*)    Hemoglobin 10.1 (*)    HCT 31.7 (*)    RDW 15.6 (*)    All other components within normal limits  BRAIN NATRIURETIC PEPTIDE - Abnormal; Notable for the following components:   B Natriuretic Peptide 457.8 (*)    All other components within normal limits  TROPONIN I (HIGH SENSITIVITY) - Abnormal; Notable for the following components:   Troponin I (High Sensitivity) 29 (*)    All other components within normal limits  TROPONIN I (HIGH SENSITIVITY) - Abnormal; Notable for the following components:   Troponin I (High Sensitivity) 27 (*)    All other components within normal limits  URINALYSIS, ROUTINE W REFLEX MICROSCOPIC    EKG None  Radiology CT Head Wo Contrast Result Date: 02/01/2023 CLINICAL DATA:  Head trauma EXAM: CT HEAD WITHOUT CONTRAST TECHNIQUE: Contiguous axial images were obtained from the base of the skull through the vertex without intravenous contrast. RADIATION DOSE REDUCTION: This exam was performed according to the departmental dose-optimization program which includes automated exposure control, adjustment of the mA and/or kV according to patient size and/or use of iterative reconstruction technique. COMPARISON:  11/29/2022 FINDINGS: Brain: No evidence of acute infarction, hemorrhage, mass, mass effect, or midline shift. No hydrocephalus or extra-axial fluid collection. Vascular: No hyperdense vessel. Atherosclerotic calcifications in the intracranial carotid and vertebral arteries. Skull: Negative for fracture or focal lesion. Sinuses/Orbits: No acute finding. Other: The mastoid air cells are well aerated. IMPRESSION: No acute intracranial process. Electronically Signed   By: Wiliam Ke M.D.   On: 02/01/2023 19:31   DG Chest 2 View Result Date: 02/01/2023 CLINICAL DATA:  Shortness of breath EXAM: CHEST - 2 VIEW COMPARISON:  11/24/2022 FINDINGS: Stable heart size status post sternotomy and CABG. Aortic atherosclerosis. Moderate pleural effusions, right  greater than left. Associated bibasilar opacities. No pneumothorax. IMPRESSION: Moderate pleural effusions, right greater than left. Associated bibasilar opacities, atelectasis versus pneumonia. Electronically Signed   By: Duanne Guess D.O.   On: 02/01/2023 17:46    Procedures Procedures  {Document cardiac monitor, telemetry assessment procedure when appropriate:1}  Medications Ordered in ED Medications - No data to display  ED Course/ Medical Decision Making/ A&P   {   Click here for ABCD2, HEART and other calculatorsREFRESH Note before signing :1}                              Medical Decision Making Amount and/or Complexity of Data Reviewed Labs: ordered. Radiology: ordered.  Risk Prescription drug management.   ***  {Document critical care time when appropriate:1} {Document review of labs and clinical decision  tools ie heart score, Chads2Vasc2 etc:1}  {Document your independent review of radiology images, and any outside records:1} {Document your discussion with family members, caretakers, and with consultants:1} {Document social determinants of health affecting pt's care:1} {Document your decision making why or why not admission, treatments were needed:1} Final Clinical Impression(s) / ED Diagnoses Final diagnoses:  None    Rx / DC Orders ED Discharge Orders     None

## 2023-02-01 NOTE — Progress Notes (Signed)
[name] [name] is a 68 y.o. male here for a recurrence of a previously resolved problem.  History of Present Illness:   Chief Complaint  Patient presents with   Hallucinations    Pt is having hallucinations x 2 this past week.    HPI - Pt is accompanied by his son, who is helping to provide history.   Hallucinations / UTI / hx of heart failure: Pt reports 2 episodes of hallucinations within the past week. His son states whenever he has had hallucinations in the past he has been diagnosed with UTIs. The last time he had hallucinations, they became severe and he was hospitalized for severe urinary tract infection. Pt had similar symptoms about 1 month ago, when his UTI cleared up his hallucinations resolved.  His hallucinations tend to be visual, not auditory.  Pt is seeing his dead friends, "nothing frightening"  His son reports pt is aware of these hallucination are occurring.  Son reports pt seems to currently be at his baseline this week, "only difference is the hallucinations".   Pt was recently admitted for 5 weeks at the hospital for chronic conditions (HTN, heart failure). His son reports some "delirium" during his stay at the hospital.  Pt is currently on Seroquel.  His son has helped to ensure pt is taking his medications.  Following his discharge from the hospital pt went to [facility].  While being admitted to the hospital and at [facility] he lost about 25 lbs due to not eating sufficient amounts of food.   He has since gained back some weight, but is unsure if it is fluid retention given he is not currently taking Furosemide.  Pt not drinking enough water, mostly drinks diet coke.  Of note, pt is wearing his Dexcom but loses connection multiple times a day and is at times unable to view blood glucose readings.   Pt had GI distress on Tuesday but does report having diarrhea and nausea is "usual" for him.  Denies any fluid retention,  fever, or chills.   Pt is strongly against going to the ER, is agreeable to sign a refusal of medial treatment form.  His son notes that he was also discharged from [facility] against medical advise.  Pt has health aids that come to his home -- well care, OT, PT, [place] caregivers.   Wt Readings from Last 4 Encounters:  02/01/23 168 lb (76.2 kg)  01/18/23 159 lb 6 oz (72.3 kg)  01/08/23 154 lb (69.9 kg)  01/04/23 148 lb 8 oz (67.4 kg)     Past Medical History:  Diagnosis Date   Anxiety    Arthritis    Cataract    CHF (congestive heart failure) (HCC)    Constipation    pt states goes EOD- hard stools    Coronary artery disease    Depression    Diabetes mellitus without complication (HCC)    Diabetic retinopathy (HCC)    DKA (diabetic ketoacidoses)    Hyperlipidemia    Hypertension    off meds   Hypertensive retinopathy    Non-intractable vomiting without nausea 06/10/2018   Postherpetic neuralgia 08/11/2021   Speculative diagnosis   Psoriasis    Tubular adenoma of colon 03/2019     Social History   Tobacco Use   Smoking status: Some Days    Types: Cigars    Last attempt to quit: 2022    Years since quitting: 3.0   Smokeless tobacco: Never  Vaping  Use   Vaping status: Never Used  Substance Use Topics   Alcohol use: Yes    Comment: about once a year   Drug use: No    Past Surgical History:  Procedure Laterality Date   ABDOMINAL AORTOGRAM W/LOWER EXTREMITY N/A 08/30/2020   Procedure: ABDOMINAL AORTOGRAM W/LOWER EXTREMITY;  Surgeon: Nada Libman, MD;  Location: [facility] INVASIVE CV LAB;  Service: Cardiovascular;  Laterality: N/A;   ABDOMINAL AORTOGRAM W/LOWER EXTREMITY N/A 11/15/2020   Procedure: ABDOMINAL AORTOGRAM W/LOWER EXTREMITY;  Surgeon: Nada Libman, MD;  Location: [facility];  Service: Cardiovascular;  Laterality: N/A;   ANKLE SURGERY     right   CARDIAC CATHETERIZATION  09/25/2001   COLONOSCOPY     ~10 yr ago- normal  per pt    CORONARY  ARTERY BYPASS GRAFT  09/30/2001   CABG x 4   IR THORACENTESIS ASP PLEURAL SPACE W/IMG GUIDE  05/19/2020   LAPAROSCOPIC INGUINAL HERNIA REPAIR Bilateral 02/09/2010   MASS EXCISION Left 03/12/2013   Procedure: LEFT INDEX EXCISION MASS AND DEBRIDMENT DISTAL INTERPHALANGEAL JOINT;  Surgeon: Tami Ribas, MD;  Location: [facility];  Service: Orthopedics;  Laterality: Left;   PERIPHERAL VASCULAR BALLOON ANGIOPLASTY Left 08/30/2020   Procedure: PERIPHERAL VASCULAR BALLOON ANGIOPLASTY;  Surgeon: Nada Libman, MD;  Location: [facility];  Service: Cardiovascular;  Laterality: Left;  Peroneal   TRANSESOPHAGEAL ECHOCARDIOGRAM (CATH LAB) N/A 11/26/2022   Procedure: TRANSESOPHAGEAL ECHOCARDIOGRAM;  Surgeon: [name], MD;  Location: [facility] INVASIVE CV LAB;  Service: Cardiovascular;  Laterality: N/A;    Family History  Problem Relation Age of Onset   Lymphoma Father    Cancer Paternal Grandmother        Colon Cancer   Colon cancer Paternal Grandmother    Colon polyps Neg Hx    Esophageal cancer Neg Hx    Rectal cancer Neg Hx    Stomach cancer Neg Hx    Diabetes Mellitus I Neg Hx    Pancreatic cancer Neg Hx     Allergies  Allergen Reactions   Wool Alcohol [Lanolin] Hives and Itching    Wool fabric    Current Medications:   Current Outpatient Medications:    acetaminophen (TYLENOL) 500 MG tablet, Take 500 mg by mouth as needed for mild pain or moderate pain., Disp: , Rfl:    apixaban (ELIQUIS) 5 MG TABS tablet, Take 1 tablet (5 mg total) by mouth 2 (two) times daily., Disp: 180 tablet, Rfl: 1   aspirin EC 81 MG EC tablet, Take 1 tablet (81 mg total) by mouth daily. (Patient taking differently: Take 81 mg by mouth in the morning.), Disp: 30 tablet, Rfl: 0   atorvastatin (LIPITOR) 40 MG tablet, TAKE 1 TABLET BY MOUTH EVERY DAY, Disp: 90 tablet, Rfl: 1   Continuous Glucose Transmitter (DEXCOM G6 TRANSMITTER) MISC, USE AS INSTRUCTED TO CHECK BLOOD SUGAR., Disp: ,  Rfl:    glipiZIDE (GLUCOTROL) 5 MG tablet, Take 1 tablet (5 mg total) by mouth daily before breakfast., Disp: 90 tablet, Rfl: 3   hydrOXYzine (ATARAX) 25 MG tablet, Take 1 tablet (25 mg total) by mouth 3 (three) times daily as needed for anxiety (sleep)., Disp: 30 tablet, Rfl: 0   Incontinence Supply Disposable (DISPOSABLE BRIEF LARGE) MISC, 1 each by Does not apply route as needed., Disp: 36 each, Rfl: 5   loperamide (IMODIUM) 2 MG capsule, Take 1 capsule (2 mg total) by mouth 4 (four) times daily as needed for  diarrhea or loose stools., Disp: 30 capsule, Rfl: 1   losartan (COZAAR) 50 MG tablet, TAKE I TABLET BY MOUTH ONCE A DAY FOR HYPERTENSION, Disp: 30 tablet, Rfl: 2   mirtazapine (REMERON) 15 MG tablet, TAKE 1 TABLET BY MOUTH EVERYDAY AT BEDTIME, Disp: 90 tablet, Rfl: 1   Multiple Vitamins-Minerals (MULTIVITAMIN WITH MINERALS) tablet, Take 1 tablet by mouth daily., Disp: , Rfl:    PARoxetine (PAXIL) 20 MG tablet, TAKE 1 TABLET ONCE A DAY FOR DEPRESSION, Disp: 30 tablet, Rfl: 2   QUEtiapine (SEROQUEL) 25 MG tablet, GIVE 1 TABLET BY MOUTH AT BEDTIME FOR DELIRIUM, Disp: 30 tablet, Rfl: 2   sitaGLIPtin (JANUVIA) 100 MG tablet, Take 1 tablet (100 mg total) by mouth daily., Disp: 90 tablet, Rfl: 3   spironolactone (ALDACTONE) 25 MG tablet, TAKE 1 TABLET BY MOUTH EVERY DAY, Disp: 90 tablet, Rfl: 1   Review of Systems:   Negative unless otherwise specified per HPI.  Vitals:   Vitals:   02/01/23 0845  BP: (!) 160/80  Pulse: 74  Temp: 97.7 F (36.5 C)  TempSrc: Temporal  SpO2: 98%  Weight: 168 lb (76.2 kg)  Height: 6' 2.5" (1.892 m)     Body mass index is 21.28 kg/m.  Physical Exam:   Physical Exam Vitals and nursing note reviewed.  Constitutional:      General: He is not in acute distress.    Appearance: He is well-developed and underweight. He is ill-appearing. He is not toxic-appearing.  Cardiovascular:     Rate and Rhythm: Normal rate and regular rhythm.     Pulses: Normal  pulses.     Heart sounds: Normal heart sounds, S1 normal and S2 normal.  Pulmonary:     Effort: Pulmonary effort is normal.     Breath sounds: Normal breath sounds.  Musculoskeletal:     Right lower leg: 2+ Pitting Edema present.     Left lower leg: 2+ Pitting Edema present.  Skin:    General: Skin is warm and dry.     Comments: Several scratches on bilateral anterior shins  Neurological:     Mental Status: He is alert.     GCS: GCS eye subscore is 4. GCS verbal subscore is 5. GCS motor subscore is 6.  Psychiatric:        Speech: Speech normal.        Behavior: Behavior normal. Behavior is cooperative.    Results for orders placed or performed in visit on 02/01/23  POCT Glucose (Device for Home Use)  Result Value Ref Range   Glucose Fasting, POC 214 (A) 70 - 99 mg/dL     Assessment and Plan:   Hallucinations (Primary) - Continuous Glucose Transmitter (DEXCOM G6 TRANSMITTER) MISC; USE AS INSTRUCTED TO CHECK BLOOD SUGAR. - POCT urinalysis dipstick - Urine Culture - POCT Glucose (Device for Home Use)  History of this in conjunction with urinary tract infection (UTI) Patient is unable to provide urinalysis today -- he is asking for antibiotic(s). I discussed that I do not feel comfortably blindly treating for presumed urinary tract infection (UTI), especially as there could be other causes of this issue. Reports that he has difficulty getting blood drawn because it causes him to pass out Discussed significant concerns regarding his independence at home and ability to care for self   Leg swelling Concern for volume overload, up 10 pounds in < 2 weeks Recent admission for acute on chronic CHF requiring IV lasix Recommend ER evaluation -- patient defers/declines recommendations  today, and has full decision making capacity  Patient refusing medical advice to go to the ER. Patient signed AMA form which stated the following:  "I certify that I, Karee Deforest Hoyles, am leaving  [facility] against the advice of the attending physician and/or clinical personnel.  I acknowledge that I have been satisfactorily informed of the risk involved in leaving the clinic without receiving further care and that risk can include, but is not limited to, property loss, injury; permanent disability, and/or death.  I hereby release the attending physician and the clinic from all responsibility and liability for any ill effects or bad outcomes which may result from my leaving the clinic against medical advice."  Noncompliance Significant concerns with independent living status, however per chart review, this remains long-seated issue Continue efforts with safer disposition   I, Isabelle Course, acting as a Neurosurgeon for Jarold Motto, [place]., have documented all relevant documentation on the behalf of Jarold Motto, [place], as directed by  Jarold Motto, PA while in the presence of Jarold Motto, [place].  I, Jarold Motto, [place], have reviewed all documentation for this visit. The documentation on 02/01/23 for the exam, diagnosis, procedures, and orders are all accurate and complete.  I spent a total of 65 minutes on this visit, today 02/01/23, which included reviewing previous notes from hospitalization 11/9-11/29, discussing plan of care with patient and using shared-decision making on next steps, and documenting the findings in the note.   Jarold Motto, PA-C

## 2023-02-01 NOTE — ED Provider Triage Note (Signed)
Emergency Medicine Provider Triage Evaluation Note  Louise Victory , a 68 y.o. male  was evaluated in triage.  Pt complains of multiple complaints.  Review of Systems  Positive:  Negative:   Physical Exam  BP (!) 186/97 (BP Location: Right Arm)   Pulse (!) 51   Temp 98.4 F (36.9 C) (Oral)   Resp 17   Ht 6\' 5"  (1.956 m)   Wt 76.2 kg   SpO2 95%   BMI 19.92 kg/m  Gen:   Awake, no distress   Resp:  Normal effort  MSK:   Moves extremities without difficulty  Other:    Medical Decision Making  Medically screening exam initiated at 4:29 PM.  Appropriate orders placed.  Halsey Rigley Niess was informed that the remainder of the evaluation will be completed by another provider, this initial triage assessment does not replace that evaluation, and the importance of remaining in the ED until their evaluation is complete.  Patient fell 2 weeks ago. Endorsing visual hallucinations x1 week which states is a symptom that he had with his last UTI 1 month ago. Patient also stating that it feels like he has a lot of fluid on heart and lungs dt CHF. Reports recent 20lb weight gain and SOB.   Dorthy Cooler, [place] 02/01/23 [phone]

## 2023-02-01 NOTE — ED Triage Notes (Signed)
Pt BIB son, sent from PCP for multiple complaints. Pt fell 1 week ago and was on floor for approx 2 hours. Denies hitting head but had no energy to get up. Denies any injury. Son states pt has been hallucinating for 1 week and took to PCP today for possible UTI. Pt was unable to go to bathroom while in office but was then told had gain 20lbs in short period of time. Pt also has CHF and has recent complaints of SOB. Pt states only pain is chronic knee pain.

## 2023-02-01 NOTE — Patient Instructions (Signed)
It was great to see you!  PLEASE GO TO THE EMERGENCY ROOM NOW.  Take care,  Jarold Motto PA-C

## 2023-02-02 ENCOUNTER — Emergency Department (HOSPITAL_COMMUNITY): Payer: PPO

## 2023-02-02 ENCOUNTER — Observation Stay (HOSPITAL_COMMUNITY): Payer: PPO

## 2023-02-02 DIAGNOSIS — Z794 Long term (current) use of insulin: Secondary | ICD-10-CM

## 2023-02-02 DIAGNOSIS — I639 Cerebral infarction, unspecified: Secondary | ICD-10-CM

## 2023-02-02 DIAGNOSIS — R441 Visual hallucinations: Secondary | ICD-10-CM

## 2023-02-02 DIAGNOSIS — I160 Hypertensive urgency: Secondary | ICD-10-CM

## 2023-02-02 DIAGNOSIS — I5031 Acute diastolic (congestive) heart failure: Secondary | ICD-10-CM | POA: Diagnosis not present

## 2023-02-02 DIAGNOSIS — I5033 Acute on chronic diastolic (congestive) heart failure: Secondary | ICD-10-CM | POA: Diagnosis not present

## 2023-02-02 DIAGNOSIS — I2510 Atherosclerotic heart disease of native coronary artery without angina pectoris: Secondary | ICD-10-CM

## 2023-02-02 DIAGNOSIS — E1159 Type 2 diabetes mellitus with other circulatory complications: Secondary | ICD-10-CM | POA: Diagnosis not present

## 2023-02-02 LAB — ECHOCARDIOGRAM LIMITED
AR max vel: 2.67 cm2
AV Peak grad: 3.1 mm[Hg]
Ao pk vel: 0.88 m/s
Area-P 1/2: 3.89 cm2
Height: 77 in
S' Lateral: 2.6 cm
Weight: 2687.85 [oz_av]

## 2023-02-02 LAB — COMPREHENSIVE METABOLIC PANEL
ALT: 12 U/L (ref 0–44)
AST: 18 U/L (ref 15–41)
Albumin: 2.6 g/dL — ABNORMAL LOW (ref 3.5–5.0)
Alkaline Phosphatase: 119 U/L (ref 38–126)
Anion gap: 14 (ref 5–15)
BUN: 16 mg/dL (ref 8–23)
CO2: 23 mmol/L (ref 22–32)
Calcium: 8.4 mg/dL — ABNORMAL LOW (ref 8.9–10.3)
Chloride: 100 mmol/L (ref 98–111)
Creatinine, Ser: 1.08 mg/dL (ref 0.61–1.24)
GFR, Estimated: >60 mL/min (ref >60)
Glucose, Bld: 190 mg/dL — ABNORMAL HIGH (ref 70–99)
Potassium: 3.3 mmol/L — ABNORMAL LOW (ref 3.5–5.1)
Sodium: 137 mmol/L (ref 135–145)
Total Bilirubin: 0.6 mg/dL (ref 0.0–1.2)
Total Protein: 6 g/dL — ABNORMAL LOW (ref 6.5–8.1)

## 2023-02-02 LAB — RAPID URINE DRUG SCREEN, HOSP PERFORMED
Amphetamines: NOT DETECTED
Barbiturates: NOT DETECTED
Benzodiazepines: NOT DETECTED
Cocaine: NOT DETECTED
Opiates: NOT DETECTED
Tetrahydrocannabinol: NOT DETECTED

## 2023-02-02 LAB — RESPIRATORY PANEL BY PCR

## 2023-02-02 LAB — CBG MONITORING, ED
Glucose-Capillary: 169 mg/dL — ABNORMAL HIGH (ref 70–99)
Glucose-Capillary: 125 mg/dL — ABNORMAL HIGH (ref 70–99)
Glucose-Capillary: 156 mg/dL — ABNORMAL HIGH (ref 70–99)
Glucose-Capillary: 127 mg/dL — ABNORMAL HIGH (ref 70–99)

## 2023-02-02 LAB — PROCALCITONIN: Procalcitonin: <0.1 ng/mL

## 2023-02-02 LAB — SARS CORONAVIRUS 2 BY RT PCR: SARS Coronavirus 2 by RT PCR: NEGATIVE

## 2023-02-02 LAB — GLUCOSE, CAPILLARY: Glucose-Capillary: 141 mg/dL — ABNORMAL HIGH (ref 70–99)

## 2023-02-02 LAB — MAGNESIUM: Magnesium: 1.5 mg/dL — ABNORMAL LOW (ref 1.7–2.4)

## 2023-02-02 LAB — LACTATE DEHYDROGENASE: LDH: 178 U/L (ref 98–192)

## 2023-02-02 MED ORDER — POTASSIUM CHLORIDE CRYS ER 20 MEQ PO TBCR
40.0000 meq | EXTENDED_RELEASE_TABLET | ORAL | Status: AC
  Administered 2023-02-02 (×2): 40 meq via ORAL
  Filled 2023-02-02 (×2): qty 2

## 2023-02-02 MED ORDER — IOHEXOL 350 MG/ML SOLN
75.0000 mL | Freq: Once | INTRAVENOUS | Status: AC | PRN
  Administered 2023-02-02: 75 mL via INTRAVENOUS

## 2023-02-02 MED ORDER — MIRTAZAPINE 15 MG PO TABS
15.0000 mg | ORAL_TABLET | Freq: Every day | ORAL | Status: DC
  Administered 2023-02-02 – 2023-02-06 (×5): 15 mg via ORAL
  Filled 2023-02-02 (×5): qty 1

## 2023-02-02 MED ORDER — HYDRALAZINE HCL 20 MG/ML IJ SOLN: 5.0000 mg | Freq: Four times a day (QID) | INTRAMUSCULAR | Status: DC | PRN

## 2023-02-02 MED ORDER — ORAL CARE MOUTH RINSE: 15.0000 mL | OROMUCOSAL | Status: DC | PRN

## 2023-02-02 MED ORDER — MAGNESIUM SULFATE 2 GM/50ML IV SOLN
2.0000 g | Freq: Once | INTRAVENOUS | Status: AC
  Administered 2023-02-02: 2 g via INTRAVENOUS
  Filled 2023-02-02: qty 50

## 2023-02-02 MED ORDER — QUETIAPINE FUMARATE 25 MG PO TABS
25.0000 mg | ORAL_TABLET | Freq: Every day | ORAL | Status: DC
  Administered 2023-02-02 – 2023-02-06 (×5): 25 mg via ORAL
  Filled 2023-02-02 (×5): qty 1

## 2023-02-02 MED ORDER — STROKE: EARLY STAGES OF RECOVERY BOOK
Freq: Once | Status: AC
  Filled 2023-02-02: qty 1

## 2023-02-02 MED ORDER — INSULIN ASPART 100 UNIT/ML IJ SOLN
0.0000 [IU] | Freq: Every day | INTRAMUSCULAR | Status: DC
  Administered 2023-02-03 – 2023-02-06 (×3): 2 [IU] via SUBCUTANEOUS

## 2023-02-02 MED ORDER — FUROSEMIDE 10 MG/ML IJ SOLN
40.0000 mg | Freq: Two times a day (BID) | INTRAMUSCULAR | Status: DC
  Administered 2023-02-02 – 2023-02-04 (×4): 40 mg via INTRAVENOUS
  Filled 2023-02-02 (×5): qty 4

## 2023-02-02 MED ORDER — PAROXETINE HCL 20 MG PO TABS
20.0000 mg | ORAL_TABLET | Freq: Every day | ORAL | Status: DC
  Administered 2023-02-02 – 2023-02-06 (×5): 20 mg via ORAL
  Filled 2023-02-02 (×7): qty 1

## 2023-02-02 MED ORDER — INSULIN ASPART 100 UNIT/ML IJ SOLN
0.0000 [IU] | Freq: Three times a day (TID) | INTRAMUSCULAR | Status: DC
  Administered 2023-02-02: 2 [IU] via SUBCUTANEOUS
  Administered 2023-02-02: 1 [IU] via SUBCUTANEOUS
  Administered 2023-02-02 – 2023-02-03 (×2): 2 [IU] via SUBCUTANEOUS
  Administered 2023-02-03: 1 [IU] via SUBCUTANEOUS
  Administered 2023-02-03: 2 [IU] via SUBCUTANEOUS
  Administered 2023-02-04: 5 [IU] via SUBCUTANEOUS
  Administered 2023-02-04: 2 [IU] via SUBCUTANEOUS
  Administered 2023-02-04: 3 [IU] via SUBCUTANEOUS
  Administered 2023-02-05 – 2023-02-06 (×4): 1 [IU] via SUBCUTANEOUS

## 2023-02-02 MED ORDER — HYDRALAZINE HCL 20 MG/ML IJ SOLN
5.0000 mg | Freq: Four times a day (QID) | INTRAMUSCULAR | Status: DC | PRN
  Administered 2023-02-02: 5 mg via INTRAVENOUS
  Filled 2023-02-02: qty 1

## 2023-02-02 MED ORDER — SPIRONOLACTONE 25 MG PO TABS: 25.0000 mg | ORAL_TABLET | Freq: Every day | ORAL | Status: DC

## 2023-02-02 MED ORDER — AMLODIPINE BESYLATE 10 MG PO TABS
10.0000 mg | ORAL_TABLET | Freq: Every day | ORAL | Status: DC
  Administered 2023-02-02 – 2023-02-06 (×5): 10 mg via ORAL
  Filled 2023-02-02: qty 1
  Filled 2023-02-02: qty 2
  Filled 2023-02-02 (×3): qty 1

## 2023-02-02 MED ORDER — LOSARTAN POTASSIUM 50 MG PO TABS: 50.0000 mg | ORAL_TABLET | Freq: Every day | ORAL | Status: DC

## 2023-02-02 MED ORDER — HYDRALAZINE HCL 25 MG PO TABS
25.0000 mg | ORAL_TABLET | Freq: Four times a day (QID) | ORAL | Status: DC | PRN
  Administered 2023-02-02 – 2023-02-03 (×2): 25 mg via ORAL
  Filled 2023-02-02 (×2): qty 1

## 2023-02-02 MED ORDER — APIXABAN 5 MG PO TABS
5.0000 mg | ORAL_TABLET | Freq: Two times a day (BID) | ORAL | Status: DC
  Administered 2023-02-02 – 2023-02-06 (×10): 5 mg via ORAL
  Filled 2023-02-02 (×10): qty 1

## 2023-02-02 MED ORDER — ATORVASTATIN CALCIUM 40 MG PO TABS
40.0000 mg | ORAL_TABLET | Freq: Every day | ORAL | Status: DC
  Administered 2023-02-02 – 2023-02-06 (×5): 40 mg via ORAL
  Filled 2023-02-02 (×5): qty 1

## 2023-02-02 MED ORDER — LINAGLIPTIN 5 MG PO TABS
5.0000 mg | ORAL_TABLET | Freq: Every day | ORAL | Status: DC
  Administered 2023-02-02 – 2023-02-06 (×5): 5 mg via ORAL
  Filled 2023-02-02 (×5): qty 1

## 2023-02-02 MED ORDER — LOSARTAN POTASSIUM 50 MG PO TABS
50.0000 mg | ORAL_TABLET | Freq: Every day | ORAL | Status: DC
Start: 2023-02-02 — End: 2023-02-05
  Administered 2023-02-02 – 2023-02-04 (×3): 50 mg via ORAL
  Filled 2023-02-02 (×3): qty 1

## 2023-02-02 NOTE — Consult Note (Signed)
NEUROLOGY CONSULT NOTE   Date of service: February 02, 2023 Patient Name: [name] [name] MRN:  [id number] DOB:  1955-12-12 Chief Complaint: "Visual hallucinations, shortness of breath and lower extremity swelling" Requesting Provider: Almon Hercules, MD  History of Present Illness  [name] [name] is a 68 y.o. male with hx of CHF, T2DM, CAD s/p CABG, stroke, HTN, PVD, A-fib on Eliquis, BPH, anxiety, depression and HLD who presented originally with visual hallucinations, shortness of breath and weight gain.  He was found to have subacute infarcts in the right frontal lobe on MRI.  Patient states that visual hallucinations have stopped since yesterday, and patient says that these often occur when he has a UTI.  He denies any stroke symptoms but states that he thinks he had the flu a few weeks ago.  Strokes are likely incidental, and on further review with patient, he feels that he most likely missed a dose of Eliquis sometime in the past few weeks.  LKW: Uncertain Modified rankin score: 2-Slight disability-UNABLE to perform all activities but does not need assistance IV Thrombolysis: No, outside of window EVT: No, completed stroke seen on MRI  NIHSS components Score: Comment  1a Level of Conscious 0[x]  1[]  2[]  3[]      1b LOC Questions 0[x]  1[]  2[]       1c LOC Commands 0[x]  1[]  2[]       2 Best Gaze 0[x]  1[]  2[]       3 Visual 0[x]  1[]  2[]  3[]      4 Facial Palsy 0[x]  1[]  2[]  3[]      5a Motor Arm - left 0[x]  1[]  2[]  3[]  4[]  UN[]    5b Motor Arm - Right 0[x]  1[]  2[]  3[]  4[]  UN[]    6a Motor Leg - Left 0[x]  1[]  2[]  3[]  4[]  UN[]    6b Motor Leg - Right 0[x]  1[]  2[]  3[]  4[]  UN[]    7 Limb Ataxia 0[x]  1[]  2[]  3[]  UN[]     8 Sensory 0[x]  1[]  2[]  UN[]      9 Best Language 0[x]  1[]  2[]  3[]      10 Dysarthria 0[x]  1[]  2[]  UN[]      11 Extinct. and Inattention 0[x]  1[]  2[]       TOTAL:0       ROS  Comprehensive ROS performed and pertinent positives documented in HPI   Past History   Past  Medical History:  Diagnosis Date   Anxiety    Arthritis    Cataract    CHF (congestive heart failure) (HCC)    Constipation    pt states goes EOD- hard stools    Coronary artery disease    Depression    Diabetes mellitus without complication (HCC)    Diabetic retinopathy (HCC)    DKA (diabetic ketoacidoses)    Hyperlipidemia    Hypertension    off meds   Hypertensive retinopathy    Non-intractable vomiting without nausea 06/10/2018   Postherpetic neuralgia 08/11/2021   Speculative diagnosis   Psoriasis    Tubular adenoma of colon 03/2019    Past Surgical History:  Procedure Laterality Date   ABDOMINAL AORTOGRAM W/LOWER EXTREMITY N/A 08/30/2020   Procedure: ABDOMINAL AORTOGRAM W/LOWER EXTREMITY;  Surgeon: Nada Libman, MD;  Location: [facility];  Service: Cardiovascular;  Laterality: N/A;   ABDOMINAL AORTOGRAM W/LOWER EXTREMITY N/A 11/15/2020   Procedure: ABDOMINAL AORTOGRAM W/LOWER EXTREMITY;  Surgeon: Nada Libman, MD;  Location: [facility];  Service: Cardiovascular;  Laterality: N/A;   ANKLE SURGERY     right  CARDIAC CATHETERIZATION  09/25/2001   COLONOSCOPY     ~10 yr ago- normal  per pt    CORONARY ARTERY BYPASS GRAFT  09/30/2001   CABG x 4   IR THORACENTESIS ASP PLEURAL SPACE W/IMG GUIDE  05/19/2020   LAPAROSCOPIC INGUINAL HERNIA REPAIR Bilateral 02/09/2010   MASS EXCISION Left 03/12/2013   Procedure: LEFT INDEX EXCISION MASS AND DEBRIDMENT DISTAL INTERPHALANGEAL JOINT;  Surgeon: Tami Ribas, MD;  Location: [facility];  Service: Orthopedics;  Laterality: Left;   PERIPHERAL VASCULAR BALLOON ANGIOPLASTY Left 08/30/2020   Procedure: PERIPHERAL VASCULAR BALLOON ANGIOPLASTY;  Surgeon: Nada Libman, MD;  Location: [facility];  Service: Cardiovascular;  Laterality: Left;  Peroneal   TRANSESOPHAGEAL ECHOCARDIOGRAM (CATH LAB) N/A 11/26/2022   Procedure: TRANSESOPHAGEAL ECHOCARDIOGRAM;  Surgeon: [name], MD;   Location: [facility];  Service: Cardiovascular;  Laterality: N/A;    Family History: Family History  Problem Relation Age of Onset   Lymphoma Father    Cancer Paternal Grandmother        Colon Cancer   Colon cancer Paternal Grandmother    Colon polyps Neg Hx    Esophageal cancer Neg Hx    Rectal cancer Neg Hx    Stomach cancer Neg Hx    Diabetes Mellitus I Neg Hx    Pancreatic cancer Neg Hx     Social History  reports that he has been smoking cigars. He has never used smokeless tobacco. He reports current alcohol use. He reports that he does not use drugs.  Allergies  Allergen Reactions   Wool Alcohol [Lanolin] Hives and Itching    Wool fabric    Medications   Current Facility-Administered Medications:    [START ON 02/03/2023]  stroke: early stages of recovery book, , Does not apply, Once, John Giovanni, MD   atorvastatin (LIPITOR) tablet 40 mg, 40 mg, Oral, QHS, John Giovanni, MD   furosemide (LASIX) injection 40 mg, 40 mg, Intravenous, BID, John Giovanni, MD, 40 mg at 02/02/23 5409   hydrALAZINE (APRESOLINE) injection 5 mg, 5 mg, Intravenous, Q6H PRN, John Giovanni, MD, 5 mg at 02/02/23 8119   insulin aspart (novoLOG) injection 0-5 Units, 0-5 Units, Subcutaneous, QHS, [place], Ulyess Blossom, MD   insulin aspart (novoLOG) injection 0-9 Units, 0-9 Units, Subcutaneous, TID WC, John Giovanni, MD, 2 Units at 02/02/23 1478   losartan (COZAAR) tablet 50 mg, 50 mg, Oral, Daily, John Giovanni, MD   mirtazapine (REMERON) tablet 15 mg, 15 mg, Oral, QHS, John Giovanni, MD   PARoxetine (PAXIL) tablet 20 mg, 20 mg, Oral, Daily, John Giovanni, MD   QUEtiapine (SEROQUEL) tablet 25 mg, 25 mg, Oral, QHS, John Giovanni, MD  Current Outpatient Medications:    acetaminophen (TYLENOL) 500 MG tablet, Take 500 mg by mouth as needed for mild pain or moderate pain., Disp: , Rfl:    apixaban (ELIQUIS) 5 MG TABS tablet, Take 1 tablet (5 mg total) by  mouth 2 (two) times daily., Disp: 180 tablet, Rfl: 1   aspirin EC 81 MG EC tablet, Take 1 tablet (81 mg total) by mouth daily. (Patient taking differently: Take 81 mg by mouth in the morning.), Disp: 30 tablet, Rfl: 0   atorvastatin (LIPITOR) 40 MG tablet, TAKE 1 TABLET BY MOUTH EVERY DAY (Patient taking differently: Take 40 mg by mouth at bedtime.), Disp: 90 tablet, Rfl: 1   Cholecalciferol 25 MCG (1000 UT) capsule, Take 1,000 Units by mouth daily., Disp: , Rfl:    glipiZIDE (GLUCOTROL)  5 MG tablet, Take 1 tablet (5 mg total) by mouth daily before breakfast., Disp: 90 tablet, Rfl: 3   hydrOXYzine (ATARAX) 25 MG tablet, Take 1 tablet (25 mg total) by mouth 3 (three) times daily as needed for anxiety (sleep). (Patient taking differently: Take 25 mg by mouth daily as needed for anxiety (sleep).), Disp: 30 tablet, Rfl: 0   loperamide (IMODIUM) 2 MG capsule, Take 1 capsule (2 mg total) by mouth 4 (four) times daily as needed for diarrhea or loose stools., Disp: 30 capsule, Rfl: 1   losartan (COZAAR) 50 MG tablet, TAKE I TABLET BY MOUTH ONCE A DAY FOR HYPERTENSION, Disp: 30 tablet, Rfl: 2   mirtazapine (REMERON) 15 MG tablet, TAKE 1 TABLET BY MOUTH EVERYDAY AT BEDTIME, Disp: 90 tablet, Rfl: 1   Multiple Vitamins-Minerals (MULTIVITAMIN WITH MINERALS) tablet, Take 1 tablet by mouth daily., Disp: , Rfl:    PARoxetine (PAXIL) 20 MG tablet, TAKE 1 TABLET ONCE A DAY FOR DEPRESSION, Disp: 30 tablet, Rfl: 2   QUEtiapine (SEROQUEL) 25 MG tablet, GIVE 1 TABLET BY MOUTH AT BEDTIME FOR DELIRIUM, Disp: 30 tablet, Rfl: 2   sitaGLIPtin (JANUVIA) 100 MG tablet, Take 1 tablet (100 mg total) by mouth daily., Disp: 90 tablet, Rfl: 3   spironolactone (ALDACTONE) 25 MG tablet, TAKE 1 TABLET BY MOUTH EVERY DAY, Disp: 90 tablet, Rfl: 1  Vitals   Vitals:   02/02/23 0715 02/02/23 0725 02/02/23 0727 02/02/23 0845  BP: (!) 195/95 (!) 187/82  (!) 194/96  Pulse: 60 (!) 57  (!) 53  Resp: 18 (!) 22  20  Temp:   97.8 F (36.6 C)    TempSrc:      SpO2: 97% 96%  98%  Weight:      Height:        Body mass index is 19.92 kg/m.  Physical Exam   Constitutional: Appears well-developed and well-nourished.  Psych: Affect appropriate to situation.  Eyes: No scleral injection.  HENT: No OP obstruction.  Head: Normocephalic.  Respiratory: Effort normal, non-labored breathing.  Skin: WDI.   Neurologic Examination    NEURO:  Mental Status: Alert and oriented to person place time and situation, able to discuss history of present complaint Speech/Language: speech is without dysarthria or aphasia.  Naming, repetition, fluency, and comprehension intact.  Cranial Nerves:  II: PERRL. Visual fields full but very poor vision in the left eye (after cataract surgery) III, IV, VI: EOMI. Eyelids elevate symmetrically.  V: Sensation is intact to light touch and symmetrical to face.  VII: Smile is symmetrical.  VIII: hearing intact to voice. IX, X: Phonation is normal.  NU:UVOZDGUY shrug 5/5. XII: tongue is midline without fasciculations. Motor: 5/5 strength to all muscle groups tested.  Tone: is normal and bulk is normal Sensation- Intact to light touch bilaterally. Extinction absent to light touch to DSS.  Coordination: FTN intact bilaterally Gait- deferred   Labs/Imaging/Neurodiagnostic studies   CBC:  Recent Labs  Lab 02/02/2023 1625  WBC 5.5  HGB 10.1*  HCT 31.7*  MCV 93.5  PLT 227   Basic Metabolic Panel:  Lab Results  Component Value Date   NA 137 02/02/2023   K 3.3 (L) 02/02/2023   CO2 23 02/02/2023   GLUCOSE 190 (H) 02/02/2023   BUN 16 02/02/2023   CREATININE 1.08 02/02/2023   CALCIUM 8.4 (L) 02/02/2023   GFRNONAA >60 02/02/2023   GFRAA >60 06/09/2018   Lipid Panel:  Lab Results  Component Value Date   LDLCALC 52  11/30/2022   HgbA1c:  Lab Results  Component Value Date   HGBA1C 9.2 (H) 11/30/2022   Urine Drug Screen:     Component Value Date/Time   LABOPIA NONE DETECTED 11/23/2022 1336    COCAINSCRNUR NONE DETECTED 11/23/2022 1336   LABBENZ NONE DETECTED 11/23/2022 1336   AMPHETMU NONE DETECTED 11/23/2022 1336   THCU NONE DETECTED 11/23/2022 1336   LABBARB NONE DETECTED 11/23/2022 1336    Alcohol Level     Component Value Date/Time   ETH <10 01/03/2022 1236   INR No results found for: "INR" APTT No results found for: "APTT" AED levels: No results found for: "PHENYTOIN", "ZONISAMIDE", "LAMOTRIGINE", "LEVETIRACETA"  CT Head without contrast(Personally reviewed): No acute abnormality  CT angio Head and Neck with contrast(Personally reviewed): Pending  MRI Brain(Personally reviewed): Subacute ischemic infarcts in right frontal white matter, chronic microhemorrhage in left cerebellum and bilateral temporal lobes  ASSESSMENT   [name] [name] is a 68 y.o. male with history of CHF, diabetes, CAD status post CABG, stroke, hypertension, PVD, A-fib on Eliquis, BPH, anxiety, depression, hyperlipidemia and psoriasis who originally presented for evaluation of visual hallucinations, shortness of breath and weight gain.  He states that he has recently had the flu.  He is receiving appropriate treatment for CHF flare.  MRI brain demonstrated subacute infarcts in right frontal white matter.  Upon review with patient, he tries to be compliant with his Eliquis but thinks he may have missed a dose over the past few weeks.  This is likely the source of these very small strokes, but will proceed with stroke workup in any case.  He denies any symptoms from the strokes, and NIHSS is 0.  As echocardiogram was performed in November and patient's incidental strokes were likely due to missed doses of Eliquis, recommend no further stroke workup.  Patient counseled on importance of compliance with Eliquis  RECOMMENDATIONS   - No further stroke workup needed as strokes are incidental and likely due to missed Eliquis doses. - Follow up with outpatient  neurology ______________________________________________________________________  Patient seen by NP and then by MD, MD to edit note as needed.  Signed, Cortney E Ernestina Columbia, NP [facility]    Attending Neurohospitalist Addendum Patient seen and examined with APP/Resident. Agree with the history and physical as documented above. Agree with the plan as documented, which I helped formulate. I have edited the note above to reflect my full findings and recommendations. I have independently reviewed the chart, obtained history, review of systems and examined the patient.I have personally reviewed pertinent head/neck/spine imaging (CT/MRI). Please feel free to call with any questions.  -- Bing Neighbors, MD [facility] [phone]  If 7pm- 7am, please page neurology on call as listed in AMION.

## 2023-02-02 NOTE — ED Notes (Signed)
Pt incont of urine. Bed changed and patient placed in clean brief. Warm blankets given and sheets changed.

## 2023-02-02 NOTE — Evaluation (Signed)
Physical Therapy Evaluation and Discharge Patient Details Name: [name] [name] MRN: [id number] DOB: 18-Jan-1955 Today's Date: 02/02/2023  History of Present Illness  Pt is a 68 y.o. male who presented 02/01/23 from his PCP with SOB, cough, orthopnea, and HF. Of note, for the past 3 weeks he has had continued visual hallucinations and has been talking and seeing his deceased brother. MRI revealed small foci of early subacute ischemia within the right frontal white matter and chronic microhemorrhage in the left cerebellum and both temporal lobes. PMH: A-fib on Eliquis, CAD s/p CABG, PAD, diastolic CHF, CVA, DM2 on insulin pump, HTN, HLD, depression  Clinical Impression  Pt was admitted to the ED 02/01/23 with conditions above and deficits below, see PT Problem List. PTA, pt was living alone in a level entry apartment with personal care nurses and a son who lives in the next building providing PRN assistance throughout the day. Pt is mod I for functional mobility with a rollator. Currently the pt is min A for dynamic balance and sit to supine, mod A for supine to sit, and CGA for functional mobility. Given pt's proximity to his functional baseline, support system, and already in place physical therapy, PT recommends pt continues to follow up with HHPT upon discharge to address LE weakness, dynamic balance, and functional mobility. As pt is likely near his functional baseline and would likely return to his baseline in his familiar environment, we do not feel that acute skilled PT is necessary at this time. Will defer further mobility to nursing and mobility specialists.         If plan is discharge home, recommend the following: Assistance with cooking/housework;Assist for transportation;A little help with walking and/or transfers   Can travel by private vehicle        Equipment Recommendations None recommended by PT  Recommendations for Other Services       Functional Status Assessment Patient  has had a recent decline in their functional status and demonstrates the ability to make significant improvements in function in a reasonable and predictable amount of time.     Precautions / Restrictions Precautions Precautions: Fall Precaution Comments: watch HR Restrictions Weight Bearing Restrictions Per Provider Order: No      Mobility  Bed Mobility Overal bed mobility: Needs Assistance Bed Mobility: Supine to Sit, Sit to Supine     Supine to sit: Mod assist, HOB elevated Sit to supine: Min assist, HOB elevated   General bed mobility comments: Pt required VC and TC for hand placement, proper sequencing, extra time, and leg positioning for supine to sit transfer. Pt required less VC and TC for sit to supine and less time to complete. Supine to sit- Mod A for trunk control and leg positioning    Transfers Overall transfer level: Needs assistance Equipment used: Rollator (4 wheels) Transfers: Sit to/from Stand Sit to Stand: Min assist           General transfer comment: Pt performs with increased trunk flexion and decreased speed. VC for hand placement    Ambulation/Gait Ambulation/Gait assistance: Contact guard assist, Min assist Gait Distance (Feet): 200 Feet Assistive device: Rollator (4 wheels) Gait Pattern/deviations: Step-through pattern, Decreased dorsiflexion - left, Shuffle, Staggering right, Trunk flexed, Decreased stride length Gait velocity: reduced Gait velocity interpretation: 1.31 - 2.62 ft/sec, indicative of limited community ambulator   General Gait Details: Pt CGA for ambulation and min A for correcting R lateral and posterior sway during vertical head turns. Pt walks with min increased  trunk flexion with visual fixation on the ground. Short, shuffling, and slow steps  Stairs            Wheelchair Mobility     Tilt Bed    Modified Rankin (Stroke Patients Only) Modified Rankin (Stroke Patients Only) Pre-Morbid Rankin Score: No  significant disability Modified Rankin: Slight disability     Balance Overall balance assessment: Needs assistance Sitting-balance support: No upper extremity supported, Feet supported, Bilateral upper extremity supported Sitting balance-Leahy Scale: Fair Sitting balance - Comments: Pt can perform no UE support sitting balance but prefers to use both UEs to sit EOB   Standing balance support: Reliant on assistive device for balance, During functional activity, Bilateral upper extremity supported Standing balance-Leahy Scale: Poor Standing balance comment: Pt reliant on rollator for standing balance able to stand for a few minutes while having Depends changed without issue. Pt keeps static visual fixation                             Pertinent Vitals/Pain Pain Assessment Pain Assessment: 0-10 Pain Score: 5  Pain Location: bIL knees Pain Descriptors / Indicators: Sore Pain Intervention(s): Monitored during session    Home Living Family/patient expects to be discharged to:: Private residence Living Arrangements: Alone Available Help at Discharge: Family;Available PRN/intermittently;Personal care attendant Type of Home: Apartment Home Access: Level entry       Home Layout: One level Home Equipment: Grab bars - toilet;Grab bars - tub/shower;Rollator (4 wheels);Wheelchair - Lawyer Comments: Son lives in building next door and checks on him and provides assistance in the morning and night    Prior Function Prior Level of Function : Needs assist             Mobility Comments: Mod I using rollator in home and community. Dependent on caregivers for transportation ADLs Comments: No longer driving secondary to L eye blindness and R eye visual problems. Cooks most of his meals and has personal care nurses who also provide assistance around the home     Extremity/Trunk Assessment   Upper Extremity Assessment Upper Extremity Assessment: Defer to OT  evaluation    Lower Extremity Assessment Lower Extremity Assessment: Generalized weakness;RLE deficits/detail;LLE deficits/detail RLE Deficits / Details: Global MMT 4+/5 RLE Sensation: history of peripheral neuropathy RLE Coordination: WNL LLE Deficits / Details: Knee ext: 4/5; ankle DF: 3/5 LLE Sensation: history of peripheral neuropathy LLE Coordination: WNL    Cervical / Trunk Assessment Cervical / Trunk Assessment: Kyphotic  Communication   Communication Communication: No apparent difficulties Cueing Techniques: Verbal cues;Tactile cues  Cognition Arousal: Alert Behavior During Therapy: WFL for tasks assessed/performed Overall Cognitive Status: Within Functional Limits for tasks assessed                                 General Comments: Pt can be tangential and benefits from redirection during conversation. A&Ox4. Denies any further hallucinations.        General Comments General comments (skin integrity, edema, etc.): Pt's HR varied throughout the session from the low 40s to 60s while standing in room. No reports or s/sxs of dizziness or lightheadness    Exercises     Assessment/Plan    PT Assessment All further PT needs can be met in the next venue of care  PT Problem List Decreased strength;Decreased range of motion;Decreased activity tolerance;Decreased balance;Decreased mobility;Cardiopulmonary status limiting activity;Impaired sensation;Pain  PT Treatment Interventions      PT Goals (Current goals can be found in the Care Plan section)  Acute Rehab PT Goals Patient Stated Goal: Retun home PT Goal Formulation: All assessment and education complete, DC therapy Time For Goal Achievement: 02/16/23 Potential to Achieve Goals: Good    Frequency       Co-evaluation               AM-PAC PT "6 Clicks" Mobility  Outcome Measure Help needed turning from your back to your side while in a flat bed without using bedrails?: A Lot Help  needed moving from lying on your back to sitting on the side of a flat bed without using bedrails?: A Lot Help needed moving to and from a bed to a chair (including a wheelchair)?: A Little Help needed standing up from a chair using your arms (e.g., wheelchair or bedside chair)?: A Little Help needed to walk in hospital room?: A Little Help needed climbing 3-5 steps with a railing? : A Lot 6 Click Score: 15    End of Session Equipment Utilized During Treatment: Gait belt Activity Tolerance: Patient tolerated treatment well Patient left: in bed;with nursing/sitter in room;with call bell/phone within reach Nurse Communication:  (Nurse alerted to pt previous bowel movement and garment changes) PT Visit Diagnosis: Unsteadiness on feet (R26.81);Other abnormalities of gait and mobility (R26.89);Muscle weakness (generalized) (M62.81);Difficulty in walking, not elsewhere classified (R26.2);Pain Pain - Right/Left: Right (Right & Left) Pain - part of body: Knee    Time: 0930-1006 PT Time Calculation (min) (ACUTE ONLY): 36 min   Charges:   PT Evaluation $PT Eval Moderate Complexity: 1 Mod PT Treatments $Therapeutic Activity: 8-22 mins PT General Charges $$ ACUTE PT VISIT: 1 Visit         [address], SPT   [place] 02/02/2023, 11:59 AM

## 2023-02-02 NOTE — Progress Notes (Signed)
Echocardiogram 2D Echocardiogram has been performed.  Lucendia Herrlich 02/02/2023, 3:05 PM

## 2023-02-02 NOTE — Progress Notes (Addendum)
PROGRESS NOTE  [name] [name] BJY:[id number] DOB: [birth year]   PCP: Bary Leriche, PA-C  Patient is from: Home.  Lives alone.  Uses rollator at baseline.  DOA: 02/01/2023 LOS: 0  Chief complaints Chief Complaint  Patient presents with   Altered Mental Status     Brief Narrative / Interim history: 68 year old M with PMH of diastolic CHF, DM-2, CAD/CABG, CVA, HTN, PVD, paroxysmal A-fib with slow ventricular response on Eliquis, BPH, anxiety, depression, HLD and psoriasis sent to ED by PCP due to shortness of breath, cough, orthopnea, edema, weight gain and visual hallucination, and admitted with working diagnosis of acute diastolic CHF, hypertensive urgency and subacute stroke.  In ED, SBP as high as 213.  DBP as high as 105.  HR in 50s.  CBC, BMP, UA and UDS without significant finding other than proteinuria on UA.  BNP 458.  Troponin 29> 27.  CXR showed moderate bilateral pleural effusion, right> left.  CT head without acute finding.  MRI brain showed small foci of early subacute ischemia within the right frontal white matter and chronic microhemorrhage in the left cerebellum and both temporal lobes.  Neurology consulted.  Patient was started on Lasix and antihypertensive meds and admitted.  Small foci of early subacute ischemia within the right frontal white matter.  The next day, evaluated by neurology.  CT angio head and neck without significant finding.  Eliquis started.  IR consulted for thoracocentesis.   Subjective: Seen and examined earlier this morning.  No major events overnight of this morning.  No complaints but has concerned about his blood sugar.  He reports using Dexcom and insulin at home.  He denies hallucination.  Denies focal weakness, numbness or tingling.  Denies chest pain.  Breathing and edema has improved.  He denies smoking cigarette, drinking alcohol recreational drug use.  Objective: Vitals:   02/02/23 0845 02/02/23 0945 02/02/23 1000 02/02/23 1057   BP: (!) 194/96 (!) 172/101    Pulse: (!) 53 (!) 51 (!) 55   Resp: 20 (!) 21 (!) 21   Temp:    98.2 F (36.8 C)  TempSrc:    Oral  SpO2: 98% 100% 97%   Weight:      Height:        Examination:  GENERAL: No apparent distress.  Nontoxic.  Appears frail. HEENT: MMM.  Vision and hearing grossly intact.  NECK: Supple.  No apparent JVD.  RESP:  No IWOB.  Fair aeration bilaterally. CVS:  RRR. Heart sounds normal.  ABD/GI/GU: BS+. Abd soft, NTND.  MSK/EXT:  Moves extremities.  Significant muscle mass and subcu fat loss.  Trace BLE edema. SKIN: no apparent skin lesion or wound NEURO: Awake, alert and oriented appropriately. Speech clear. Cranial nerves II-XII grossly intact. Motor 5/5 in all muscle groups of UE and LE bilaterally, Normal tone. Light sensation intact in all dermatomes of upper and lower ext bilaterally. Patellar reflex symmetric.  No pronator drift.  Finger to nose intact. PSYCH: Calm. Normal affect.   Procedures:  None  Microbiology summarized: COVID-19 PCR nonreactive A 20 pathogen RVP nonreactive  Assessment and plan: Acute on chronic HFpEF: TTE in November with LVEF of 60 to 65%, mildly reduced RV SF, trivial MVR.  Presents with dyspnea, orthopnea, BLE edema and weight gain.  BNP 457.  CXR showed moderate bilateral pleural effusion, right> left.  Started on IV Lasix.  I&O incomplete.  Symptoms improved but not quite back to baseline.  Still with bibasilar rales and bilateral lower  extremity edema.  Blood pressure elevated -Continue IV Lasix 40 mg twice daily -IR consulted for thoracocentesis. -Strict intake and output, daily weight, renal functions and electrolytes -Follow echocardiogram    Subacute CVA: Hallucinations seems to have resolved.  No focal neurosymptoms or deficits on exam.  MRI brain showed small foci of early subacute ischemia within the right frontal white matter and chronic microhemorrhage in the left cerebellum and both temporal lobes. -Appreciate  guidance by neurology-Eliquis, normotension, CVA workup with CT angio, TTE, A1c, LDL, PT/OT/SLP    Hypertensive urgency: SBP as high as 213 and DBP/105 in ED.  Improved but still elevated. -Continue IV Lasix 40 mg twice daily -Continue home losartan -Start amlodipine 10 mg daily -P.o. hydralazine as needed -Avoid nodal blocking agent given bradycardia  Bilateral pleural effusion: X-ray showed moderate bilateral pleural effusion, right> left. -Diuretics as above -IR consulted for thoracocentesis.  Will send fluid analysis  Paroxysmal A-fib with bradycardia: HR in 50s. -Avoid nodal blocking agents -Continue Eliquis  Elevated troponin/history of CAD/CABG: No chest pain.  Mildly elevated troponin likely demand ischemia -Continue home statin and cardiac meds.   Chronic normocytic anemia: Stable -Continue monitoring   Uncontrolled DM-2 with hyperglycemia/proteinuria: A1c 9.2% in 11/2022.  On glipizide and Januvia at home. Recent Labs  Lab 02/02/23 0737 02/02/23 1153  GLUCAP 169* 125*  -Continue current insulin regimen-SSI-sensitive scale -Tradjenta and steroid from Januvia -Hold glipizide -Continue started on losartan -Consult diabetic coordinator   Mood disorder/visual hallucination: Hallucinations seems to have resolved.  UDS negative. -Continue mirtazapine, paroxetine, and quetiapine.  Physical deconditioning: Lives alone.  Uses rollator at baseline -PT OT as above  Hypokalemia/hypomagnesemia -Monitor and replenish as appropriate  Hyperlipidemia -Continue Lipitor.  Nutrition Body mass index is 19.92 kg/m. -Dietitian   DVT prophylaxis:   apixaban (ELIQUIS) tablet 5 mg  Code Status: Full code Family Communication: None at bedside Level of care: Progressive Status is: Observation The patient will require care spanning > 2 midnights and should be moved to inpatient because: Acute CHF, hypertensive urgency, stroke   Final disposition: To be  determined Consultants:  Neurology  55 minutes with more than 50% spent in reviewing records, counseling patient/family and coordinating care.   Sch Meds:  Scheduled Meds:  [START ON 02/03/2023]  stroke: early stages of recovery book   Does not apply Once   amLODipine  10 mg Oral Daily   apixaban  5 mg Oral BID   atorvastatin  40 mg Oral QHS   furosemide  40 mg Intravenous BID   insulin aspart  0-5 Units Subcutaneous QHS   insulin aspart  0-9 Units Subcutaneous TID WC   linagliptin  5 mg Oral Daily   losartan  50 mg Oral Daily   mirtazapine  15 mg Oral QHS   PARoxetine  20 mg Oral Daily   potassium chloride  40 mEq Oral Q3H   QUEtiapine  25 mg Oral QHS   Continuous Infusions:   PRN Meds:.hydrALAZINE  Antimicrobials: Anti-infectives (From admission, onward)    None        I have personally reviewed the following labs and images: CBC: Recent Labs  Lab 02/01/23 1625  WBC 5.5  HGB 10.1*  HCT 31.7*  MCV 93.5  PLT 227   BMP &GFR Recent Labs  Lab 02/01/23 1625 02/02/23 0640 02/02/23 0649  NA 136 137  --   K 3.6 3.3*  --   CL 101 100  --   CO2 25 23  --  GLUCOSE 106* 190*  --   BUN 14 16  --   CREATININE 0.87 1.08  --   CALCIUM 8.5* 8.4*  --   MG  --   --  1.5*   Estimated Creatinine Clearance: 69.6 mL/min (by C-G formula based on SCr of 1.08 mg/dL). Liver & Pancreas: Recent Labs  Lab 02/02/23 0640  AST 18  ALT 12  ALKPHOS 119  BILITOT 0.6  PROT 6.0*  ALBUMIN 2.6*   No results for input(s): "LIPASE", "AMYLASE" in the last 168 hours. No results for input(s): "AMMONIA" in the last 168 hours. Diabetic: No results for input(s): "HGBA1C" in the last 72 hours. Recent Labs  Lab 02/02/23 0737 02/02/23 1153  GLUCAP 169* 125*   Cardiac Enzymes: No results for input(s): "CKTOTAL", "CKMB", "CKMBINDEX", "TROPONINI" in the last 168 hours. No results for input(s): "PROBNP" in the last 8760 hours. Coagulation Profile: No results for input(s): "INR",  "PROTIME" in the last 168 hours. Thyroid Function Tests: No results for input(s): "TSH", "T4TOTAL", "FREET4", "T3FREE", "THYROIDAB" in the last 72 hours. Lipid Profile: No results for input(s): "CHOL", "HDL", "LDLCALC", "TRIG", "CHOLHDL", "LDLDIRECT" in the last 72 hours. Anemia Panel: No results for input(s): "VITAMINB12", "FOLATE", "FERRITIN", "TIBC", "IRON", "RETICCTPCT" in the last 72 hours. Urine analysis:    Component Value Date/Time   COLORURINE YELLOW 02/01/2023 2242   APPEARANCEUR HAZY (A) 02/01/2023 2242   LABSPEC 1.017 02/01/2023 2242   PHURINE 5.0 02/01/2023 2242   GLUCOSEU 50 (A) 02/01/2023 2242   GLUCOSEU >=1000 (A) 03/16/2020 0958   HGBUR SMALL (A) 02/01/2023 2242   BILIRUBINUR NEGATIVE 02/01/2023 2242   BILIRUBINUR Negative 12/31/2022 1222   KETONESUR NEGATIVE 02/01/2023 2242   PROTEINUR >=300 (A) 02/01/2023 2242   UROBILINOGEN 1.0 12/31/2022 1222   UROBILINOGEN 0.2 03/16/2020 0958   NITRITE NEGATIVE 02/01/2023 2242   LEUKOCYTESUR NEGATIVE 02/01/2023 2242   Sepsis Labs: Invalid input(s): "PROCALCITONIN", "LACTICIDVEN"  Microbiology: Recent Results (from the past 240 hours)  SARS Coronavirus 2 by RT PCR (hospital order, performed in [facility] hospital lab) *cepheid single result test* Nasopharyngeal Swab     Status: None   Collection Time: 02/02/23  6:40 AM   Specimen: Nasopharyngeal Swab; Nasal Swab  Result Value Ref Range Status   SARS Coronavirus 2 by RT PCR NEGATIVE NEGATIVE Final    Comment: Performed at [facility], [address], [place], [place] [address]  Respiratory (~20 pathogens) panel by PCR     Status: None   Collection Time: 02/02/23  6:40 AM   Specimen: Nasopharyngeal Swab; Respiratory  Result Value Ref Range Status   Adenovirus NOT DETECTED NOT DETECTED Final   Coronavirus 229E NOT DETECTED NOT DETECTED Final    Comment: (NOTE) The Coronavirus on the Respiratory Panel, DOES NOT test for the novel  Coronavirus (2019 nCoV)     Coronavirus HKU1 NOT DETECTED NOT DETECTED Final   Coronavirus NL63 NOT DETECTED NOT DETECTED Final   Coronavirus OC43 NOT DETECTED NOT DETECTED Final   Metapneumovirus NOT DETECTED NOT DETECTED Final   Rhinovirus / Enterovirus NOT DETECTED NOT DETECTED Final   Influenza A NOT DETECTED NOT DETECTED Final   Influenza B NOT DETECTED NOT DETECTED Final   Parainfluenza Virus 1 NOT DETECTED NOT DETECTED Final   Parainfluenza Virus 2 NOT DETECTED NOT DETECTED Final   Parainfluenza Virus 3 NOT DETECTED NOT DETECTED Final   Parainfluenza Virus 4 NOT DETECTED NOT DETECTED Final   Respiratory Syncytial Virus NOT DETECTED NOT DETECTED Final  Bordetella pertussis NOT DETECTED NOT DETECTED Final   Bordetella Parapertussis NOT DETECTED NOT DETECTED Final   Chlamydophila pneumoniae NOT DETECTED NOT DETECTED Final   Mycoplasma pneumoniae NOT DETECTED NOT DETECTED Final    Comment: Performed at [facility], [address] [place], [place] [address]    Radiology Studies: MR BRAIN WO CONTRAST Result Date: 02/02/2023 CLINICAL DATA:  Psychosis and abnormal cord in a shin EXAM: MRI HEAD WITHOUT CONTRAST TECHNIQUE: Multiplanar, multiecho pulse sequences of the brain and surrounding structures were obtained without intravenous contrast. COMPARISON:  11/29/2022 FINDINGS: Brain: Small foci of early subacute ischemia within the right frontal white matter (series 6, images 71-72). Chronic microhemorrhage in the left cerebellum and both temporal lobes. There is multifocal hyperintense T2-weighted signal within the white matter. Generalized volume loss. The midline structures are normal. Vascular: Normal flow voids. Skull and upper cervical spine: Normal calvarium and skull base. Visualized upper cervical spine and soft tissues are normal. Sinuses/Orbits:No paranasal sinus fluid levels or advanced mucosal thickening. No mastoid or middle ear effusion. Normal orbits. IMPRESSION: 1. Small foci of early subacute  ischemia within the right frontal white matter. 2. Chronic microhemorrhage in the left cerebellum and both temporal lobes. Electronically Signed   By: Deatra Robinson M.D.   On: 02/02/2023 02:54   CT Head Wo Contrast Result Date: 02/01/2023 CLINICAL DATA:  Head trauma EXAM: CT HEAD WITHOUT CONTRAST TECHNIQUE: Contiguous axial images were obtained from the base of the skull through the vertex without intravenous contrast. RADIATION DOSE REDUCTION: This exam was performed according to the departmental dose-optimization program which includes automated exposure control, adjustment of the mA and/or kV according to patient size and/or use of iterative reconstruction technique. COMPARISON:  11/29/2022 FINDINGS: Brain: No evidence of acute infarction, hemorrhage, mass, mass effect, or midline shift. No hydrocephalus or extra-axial fluid collection. Vascular: No hyperdense vessel. Atherosclerotic calcifications in the intracranial carotid and vertebral arteries. Skull: Negative for fracture or focal lesion. Sinuses/Orbits: No acute finding. Other: The mastoid air cells are well aerated. IMPRESSION: No acute intracranial process. Electronically Signed   By: Wiliam Ke M.D.   On: 02/01/2023 19:31   DG Chest 2 View Result Date: 02/01/2023 CLINICAL DATA:  Shortness of breath EXAM: CHEST - 2 VIEW COMPARISON:  11/24/2022 FINDINGS: Stable heart size status post sternotomy and CABG. Aortic atherosclerosis. Moderate pleural effusions, right greater than left. Associated bibasilar opacities. No pneumothorax. IMPRESSION: Moderate pleural effusions, right greater than left. Associated bibasilar opacities, atelectasis versus pneumonia. Electronically Signed   By: Duanne Guess D.O.   On: 02/01/2023 17:46      Shady Padron T. Ciro Tashiro [name]  If 7PM-7AM, please contact night-coverage [email or web address] 02/02/2023, 11:57 AM

## 2023-02-02 NOTE — Evaluation (Signed)
Occupational Therapy Evaluation and DC Summary  Patient Details Name: [name] [name] MRN: [id number] DOB: 10-25-1955 Today's Date: 02/02/2023   History of Present Illness Pt is a 68 y.o. male who presented 02/01/23 from his PCP with SOB, cough, orthopnea, and HF. Of note, for the past 3 weeks he has had continued visual hallucinations and has been talking and seeing his deceased brother. MRI revealed small foci of early subacute ischemia within the right frontal white matter and chronic microhemorrhage in the left cerebellum and both temporal lobes. PMH: A-fib on Eliquis, CAD s/p CABG, PAD, diastolic CHF, CVA, DM2 on insulin pump, HTN, HLD, depression   Clinical Impression   Pt agreeable to work with OT for evaluation after being provided crackers and applesauce. Pt overall ambulatory with CGA to close supervision in hallway simulating household distances. Pt able to complete his Adls with min A to setup assist, overall cognition seemingly functional, reinforced CHF diet and prevention strategies to which he reports compliance with (son manages meds). Pt has no further acute skilled OT services, no post acute OT needs pt to continue with [facility] PT services.       If plan is discharge home, recommend the following: Assistance with cooking/housework    Functional Status Assessment  Patient has not had a recent decline in their functional status  Equipment Recommendations  None recommended by OT (pt has rec DME)    Recommendations for Other Services       Precautions / Restrictions Precautions Precautions: Fall Precaution Comments: watch HR Restrictions Weight Bearing Restrictions Per Provider Order: No      Mobility Bed Mobility Overal bed mobility: Needs Assistance Bed Mobility: Supine to Sit, Sit to Supine     Supine to sit: Min assist Sit to supine: Contact guard assist   General bed mobility comments: Min A to raise trunk    Transfers Overall transfer level: Needs  assistance Equipment used: Rollator (4 wheels) Transfers: Sit to/from Stand Sit to Stand: Supervision           General transfer comment: STSx2 supervision from stretcher      Balance Overall balance assessment: Needs assistance Sitting-balance support: No upper extremity supported, Feet supported, Bilateral upper extremity supported Sitting balance-Leahy Scale: Fair Sitting balance - Comments: dynamic sitting to don shoes   Standing balance support: Reliant on assistive device for balance, During functional activity, Bilateral upper extremity supported Standing balance-Leahy Scale: Fair Standing balance comment: static stands no AD                           ADL either performed or assessed with clinical judgement   ADL Overall ADL's : Needs assistance/impaired Eating/Feeding: Sitting;Set up   Grooming: Standing;Supervision/safety   Upper Body Bathing: Sitting;Set up   Lower Body Bathing: Set up;Sitting/lateral leans   Upper Body Dressing : Sitting;Set up Upper Body Dressing Details (indicate cue type and reason): pt gown soiled on arrival, changed with cues to sequence for draping purposes Lower Body Dressing: Sitting/lateral leans;Set up Lower Body Dressing Details (indicate cue type and reason): don slippers Toilet Transfer: Contact guard assist;Rolling walker (2 wheels)   Toileting- Clothing Manipulation and Hygiene: Sit to/from stand;Minimal assistance Toileting - Clothing Manipulation Details (indicate cue type and reason): brief management     Functional mobility during ADLs: Contact guard assist;Rolling walker (2 wheels)       Vision   Additional Comments: blind in L eye, making it harder for him to  find objects on L side. Discussed scanning     Perception         Praxis         Pertinent Vitals/Pain Pain Assessment Pain Assessment: Faces Faces Pain Scale: Hurts little more Pain Location: bIL knees Pain Descriptors / Indicators:  Sore Pain Intervention(s): Monitored during session     Extremity/Trunk Assessment Upper Extremity Assessment Upper Extremity Assessment: Overall WFL for tasks assessed (hx of peripheral neuropathy)   Lower Extremity Assessment Lower Extremity Assessment: Generalized weakness;RLE deficits/detail;LLE deficits/detail RLE Deficits / Details: Global MMT 4+/5 RLE Sensation: history of peripheral neuropathy RLE Coordination: WNL LLE Deficits / Details: Knee ext: 4/5; ankle DF: 3/5 LLE Sensation: history of peripheral neuropathy LLE Coordination: WNL   Cervical / Trunk Assessment Cervical / Trunk Assessment: Kyphotic   Communication Communication Communication: No apparent difficulties Cueing Techniques: Verbal cues;Tactile cues   Cognition Arousal: Alert Behavior During Therapy: WFL for tasks assessed/performed Overall Cognitive Status: Within Functional Limits for tasks assessed                                 General Comments: hungry on arrival- provided with crackers, peanut butter, and applesauce     General Comments  HR in afib but ranging 40s to high 50s in session    Exercises     Shoulder Instructions      Home Living Family/patient expects to be discharged to:: Private residence Living Arrangements: Alone Available Help at Discharge: Family;Available PRN/intermittently;Personal care attendant Type of Home: Apartment Home Access: Level entry     Home Layout: One level     Bathroom Shower/Tub: Chief Strategy Officer: Standard Bathroom Accessibility: Yes   Home Equipment: Grab bars - toilet;Grab bars - tub/shower;Rollator (4 wheels);Wheelchair - Sport and exercise psychologist Comments: Son lives in building next door and checks on him and provides assistance in the morning and night      Prior Functioning/Environment Prior Level of Function : Needs assist             Mobility Comments: Mod I using rollator in home and  community. Dependent on caregivers for transportation ADLs Comments: No longer driving secondary to L eye blindness and R eye visual problems. Cooks most of his meals and has personal care nurses who also provide assistance around the home. ind for ADls        OT Problem List: Other (comment) (SOB)      OT Treatment/Interventions:      OT Goals(Current goals can be found in the care plan section) Acute Rehab OT Goals Patient Stated Goal: To get something to eat; go home OT Goal Formulation: With patient Time For Goal Achievement: 02/16/23 Potential to Achieve Goals: Good  OT Frequency:         AM-PAC OT "6 Clicks" Daily Activity     Outcome Measure Help from another person eating meals?: None Help from another person taking care of personal grooming?: A Little Help from another person toileting, which includes using toliet, bedpan, or urinal?: A Little Help from another person bathing (including washing, rinsing, drying)?: A Little Help from another person to put on and taking off regular upper body clothing?: A Little Help from another person to put on and taking off regular lower body clothing?: A Little 6 Click Score: 19   End of Session Equipment Utilized During Treatment: Gait belt;Rollator (4 wheels) Nurse Communication: Mobility status  Activity Tolerance: Patient tolerated treatment well Patient left: in bed;with call bell/phone within reach  OT Visit Diagnosis: Other (comment) (Primary reasoning acute CHF, MRI ischemia within the right frontal white matter and chronic microhemorrhage in the left cerebellum and both temporal lobes)                Time: 1610-9604 OT Time Calculation (min): 30 min Charges:  OT General Charges $OT Visit: 1 Visit OT Evaluation $OT Eval Low Complexity: 1 Low OT Treatments $Therapeutic Activity: 8-22 mins  02/02/2023  AB, OTR/L  Acute Rehabilitation Services  Office: [phone]   Tristan Schroeder 02/02/2023, 12:57 PM

## 2023-02-02 NOTE — Care Management Obs Status (Signed)
MEDICARE OBSERVATION STATUS NOTIFICATION   Patient Details  Name: Quasim Doyon MRN: [id number] Date of Birth: [birth year]   Medicare Observation Status Notification Given:  Yes    Lockie Pares, RN 02/02/2023, 11:14 AM

## 2023-02-02 NOTE — Discharge Instructions (Signed)
Senior Resources of [facility] [address] [place], [place] [address]  P: [phone] F: [phone] E: [email or web address]  Food pantry and assistance -[employer] [facility]: [address], [place] [address]. Phone [phone]  -[employer] [facility]: [address] [place], [place] [address]. [phone].  -[employer]: has the purpose of visiting the sick and shut-ins and provide for needs in the surrounding communities. Call [phone]. Email: [employer]pa[employer]@gmail .com This program provides: Food box for seniors, Financial assistance, Food to meet basic nutritional needs.  -Meals on Wheels with [employer]: [facility] residents age 27 and over who are homebound and unable to obtain and prepare a nutritious meal for themselves are eligible for this service. There may be a waiting list in certain parts of [facility] if the route in that area is full. If you are in [facility] and [place] call [phone] to register. For all other areas call [phone] to register.  -[employer]: [email or web address]   Crisis Services [facility] Mobile Crisis Management- [phone]  [facility] [address] [place], [place] [address]. Phone: [phone]

## 2023-02-02 NOTE — ED Notes (Signed)
 Patient transported to MRI

## 2023-02-02 NOTE — Care Management (Addendum)
Transition of Care [facility]) - Inpatient Brief Assessment   Patient Details  Name: [name] [name] MRN: [id number] Date of Birth: 07-28-1955  Transition of Care [facility]) CM/SW Contact:    Lockie Pares, RN Phone Number: 02/02/2023, 11:19 AM   Clinical Narrative:  Spoke with patient at bedside, collateral information obtained from son [name]. Observed the patient to have short term memory issues. Marland Kitchen He cannot remember his home health agency. He immediately forgot OBS notice. Called son [name] gave him OBV notice.He states [name] is him [facility] agency he has Charity fundraiser PT OT. He has a Occupational hygienist. Patient states he did hire Stage manager for [employer] but fired them as they seemed to sit around a lot. He wanted to get more grab bars in shower. No other DME needed.   TOC will follow   Transition of Care Asessment: Insurance and Status: Insurance coverage has been reviewed Patient has primary care physician: Yes Home environment has been reviewed: Apartment Lives alone Prior level of function:: Uses [place] has HOme health [employer] Prior/Current Home Services: Current home services Nurse, learning disability) Social Drivers of Health Review: SDOH reviewed interventions complete Readmission risk has been reviewed: Yes Transition of care needs: transition of care needs identified, TOC will continue to follow

## 2023-02-02 NOTE — H&P (Signed)
History and Physical    [name] [name] OZH:[id number] DOB: 03/06/1955 DOA: 02/01/2023  PCP: Bary Leriche, PA-C  Chief Complaint: Visual hallucinations  HPI: [name] [name] is a 68 y.o. male with medical history significant of chronic HFpEF, type 2 diabetes no longer on insulin due to recurrent hypoglycemia, CAD status post CABG, CVA, hypertension, PVD, paroxysmal A-fib with slow ventricular response on Eliquis, BPH, anxiety, depression, hyperlipidemia, psoriasis presented to the ED for evaluation of visual hallucinations, shortness of breath, and weight gain.  Hypertensive with systolic up to 200s.  Afebrile.  Labs showing no leukocytosis, hemoglobin 10.1 (stable), glucose 106, BNP 457, troponin 29> 27, UA not suggestive of infection.  Chest x-ray showing moderate bilateral pleural effusions, right greater than left.  Associated bibasilar opacities, atelectasis versus pneumonia.  CT head negative for acute intracranial abnormality.  Brain MRI showing small foci of early subacute ischemia within the right frontal white matter.  Also showing chronic microhemorrhage in the left cerebellum and both temporal lobes. Patient was given IV Lasix 40 mg, IV labetalol 10 mg, and IV Ativan 0.5 mg in the ED.  Neurology consulted and TRH called to admit.  Patient states his PCP sent him to the ED as he was having shortness of breath, cough, orthopnea, and bilateral lower extremity edema.  He is not able to tell me when his symptoms started.  Denies fevers or chest pain.  He is not sure which medications he takes at home as his son manages his medications.  He reports history of visual hallucinations with UTI in the past.  For the past 3 weeks he has continued to have visual hallucinations.  Patient states he knows that he has been seeing things which do not exist.  He has been seeing his deceased brother and talking to him.  Denies any arm or leg weakness or numbness.  No other complaints.  Review of  Systems:  Review of Systems  All other systems reviewed and are negative.   Past Medical History:  Diagnosis Date   Anxiety    Arthritis    Cataract    CHF (congestive heart failure) (HCC)    Constipation    pt states goes EOD- hard stools    Coronary artery disease    Depression    Diabetes mellitus without complication (HCC)    Diabetic retinopathy (HCC)    DKA (diabetic ketoacidoses)    Hyperlipidemia    Hypertension    off meds   Hypertensive retinopathy    Non-intractable vomiting without nausea 06/10/2018   Postherpetic neuralgia 08/11/2021   Speculative diagnosis   Psoriasis    Tubular adenoma of colon 03/2019    Past Surgical History:  Procedure Laterality Date   ABDOMINAL AORTOGRAM W/LOWER EXTREMITY N/A 08/30/2020   Procedure: ABDOMINAL AORTOGRAM W/LOWER EXTREMITY;  Surgeon: Nada Libman, MD;  Location: [facility] INVASIVE CV LAB;  Service: Cardiovascular;  Laterality: N/A;   ABDOMINAL AORTOGRAM W/LOWER EXTREMITY N/A 11/15/2020   Procedure: ABDOMINAL AORTOGRAM W/LOWER EXTREMITY;  Surgeon: Nada Libman, MD;  Location: [facility] INVASIVE CV LAB;  Service: Cardiovascular;  Laterality: N/A;   ANKLE SURGERY     right   CARDIAC CATHETERIZATION  09/25/2001   COLONOSCOPY     ~10 yr ago- normal  per pt    CORONARY ARTERY BYPASS GRAFT  09/30/2001   CABG x 4   IR THORACENTESIS ASP PLEURAL SPACE W/IMG GUIDE  05/19/2020   LAPAROSCOPIC INGUINAL HERNIA REPAIR Bilateral 02/09/2010   MASS EXCISION Left 03/12/2013  Procedure: LEFT INDEX EXCISION MASS AND DEBRIDMENT DISTAL INTERPHALANGEAL JOINT;  Surgeon: Tami Ribas, MD;  Location: [facility];  Service: Orthopedics;  Laterality: Left;   PERIPHERAL VASCULAR BALLOON ANGIOPLASTY Left 08/30/2020   Procedure: PERIPHERAL VASCULAR BALLOON ANGIOPLASTY;  Surgeon: Nada Libman, MD;  Location: [facility]VAS[facility] CV [facility];  Service: Cardiovascular;  Laterality: Left;  Peroneal   TRANSESOPHAGEAL ECHOCARDIOGRAM (CATH LAB) N/A 11/26/2022    Procedure: TRANSESOPHAGEAL ECHOCARDIOGRAM;  Surgeon: [name], MD;  Location: [facility] INVASIVE CV LAB;  Service: Cardiovascular;  Laterality: N/A;     reports that he has been smoking cigars. He has never used smokeless tobacco. He reports current alcohol use. He reports that he does not use drugs.  Allergies  Allergen Reactions   Wool Alcohol [Lanolin] Hives and Itching    Wool fabric    Family History  Problem Relation Age of Onset   Lymphoma Father    Cancer Paternal Grandmother        Colon Cancer   Colon cancer Paternal Grandmother    Colon polyps Neg Hx    Esophageal cancer Neg Hx    Rectal cancer Neg Hx    Stomach cancer Neg Hx    Diabetes Mellitus I Neg Hx    Pancreatic cancer Neg Hx     Prior to Admission medications   Medication Sig Start Date End Date Taking? Authorizing Provider  acetaminophen (TYLENOL) 500 MG tablet Take 500 mg by mouth as needed for mild pain or moderate pain.   Yes [provider]  apixaban (ELIQUIS) 5 MG TABS tablet Take 1 tablet (5 mg total) by mouth 2 (two) times daily. 03/02/22  Yes O'Neal, Ronnald Ramp, MD  aspirin EC 81 MG EC tablet Take 1 tablet (81 mg total) by mouth daily. Patient taking differently: Take 81 mg by mouth in the morning. 02/18/21  Yes Zannie Cove, MD  atorvastatin (LIPITOR) 40 MG tablet TAKE 1 TABLET BY MOUTH EVERY DAY Patient taking differently: Take 40 mg by mouth at bedtime. 09/17/22  Yes [name], [name], PA-C  Cholecalciferol 25 MCG (1000 UT) capsule Take 1,000 Units by mouth daily.   Yes [provider]  glipiZIDE (GLUCOTROL) 5 MG tablet Take 1 tablet (5 mg total) by mouth daily before breakfast. 01/09/23  Yes Shamleffer, Konrad Dolores, MD  hydrOXYzine (ATARAX) 25 MG tablet Take 1 tablet (25 mg total) by mouth 3 (three) times daily as needed for anxiety (sleep). Patient taking differently: Take 25 mg by mouth daily as needed for anxiety (sleep). 11/30/22  Yes Noralee Stain, DO   loperamide (IMODIUM) 2 MG capsule Take 1 capsule (2 mg total) by mouth 4 (four) times daily as needed for diarrhea or loose stools. 01/01/23  Yes Jeani Sow, MD  losartan (COZAAR) 50 MG tablet TAKE I TABLET BY MOUTH ONCE A DAY FOR HYPERTENSION 01/22/23  Yes [name], [name], PA-C  mirtazapine (REMERON) 15 MG tablet TAKE 1 TABLET BY MOUTH EVERYDAY AT BEDTIME 04/19/22  Yes [name], [name], PA-C  Multiple Vitamins-Minerals (MULTIVITAMIN WITH MINERALS) tablet Take 1 tablet by mouth daily.   Yes [provider]  PARoxetine (PAXIL) 20 MG tablet TAKE 1 TABLET ONCE A DAY FOR DEPRESSION 01/22/23  Yes [name], [name], PA-C  QUEtiapine (SEROQUEL) 25 MG tablet GIVE 1 TABLET BY MOUTH AT BEDTIME FOR DELIRIUM 01/22/23  Yes [name], [name], PA-C  sitaGLIPtin (JANUVIA) 100 MG tablet Take 1 tablet (100 mg total) by mouth daily. 01/09/23  Yes Shamleffer, Brent Bulla  Gust Brooms, MD  spironolactone (ALDACTONE) 25 MG tablet TAKE 1 TABLET BY MOUTH EVERY DAY 12/03/22  Yes [name], [name], PA-C    Physical Exam: Vitals:   02/02/23 0130 02/02/23 0242 02/02/23 0315 02/02/23 0330  BP: (!) 186/89 (!) 152/98 (!) 185/99 (!) 179/97  Pulse: 61 (!) 54    Resp:  (!) 21 14 (!) 23  Temp:  98.3 F (36.8 C)    TempSrc:  Oral    SpO2: 100% 100%    Weight:      Height:        Physical Exam Vitals reviewed.  Constitutional:      General: He is not in acute distress. HENT:     Head: Normocephalic and atraumatic.  Eyes:     Extraocular Movements: Extraocular movements intact.  Cardiovascular:     Rate and Rhythm: Normal rate and regular rhythm.     Pulses: Normal pulses.  Pulmonary:     Effort: Pulmonary effort is normal. No respiratory distress.     Breath sounds: No wheezing or rhonchi.  Abdominal:     General: Bowel sounds are normal. There is no distension.     Palpations: Abdomen is soft.     Tenderness: There is no abdominal tenderness. There is no guarding.  Musculoskeletal:     Cervical back:  Normal range of motion.     Right lower leg: No edema.     Left lower leg: No edema.  Skin:    General: Skin is warm and dry.  Neurological:     General: No focal deficit present.     Mental Status: He is alert and oriented to person, place, and time.     Cranial Nerves: No cranial nerve deficit.     Sensory: No sensory deficit.     Motor: No weakness.     Labs on Admission: I have personally reviewed following labs and imaging studies  CBC: Recent Labs  Lab 02/01/23 1625  WBC 5.5  HGB 10.1*  HCT 31.7*  MCV 93.5  PLT 227   Basic Metabolic Panel: Recent Labs  Lab 02/01/23 1625  NA 136  K 3.6  CL 101  CO2 25  GLUCOSE 106*  BUN 14  CREATININE 0.87  CALCIUM 8.5*   GFR: Estimated Creatinine Clearance: 86.4 mL/min (by C-G formula based on SCr of 0.87 mg/dL). Liver Function Tests: No results for input(s): "AST", "ALT", "ALKPHOS", "BILITOT", "PROT", "ALBUMIN" in the last 168 hours. No results for input(s): "LIPASE", "AMYLASE" in the last 168 hours. No results for input(s): "AMMONIA" in the last 168 hours. Coagulation Profile: No results for input(s): "INR", "PROTIME" in the last 168 hours. Cardiac Enzymes: No results for input(s): "CKTOTAL", "CKMB", "CKMBINDEX", "TROPONINI" in the last 168 hours. BNP (last 3 results) No results for input(s): "PROBNP" in the last 8760 hours. HbA1C: No results for input(s): "HGBA1C" in the last 72 hours. CBG: No results for input(s): "GLUCAP" in the last 168 hours. Lipid Profile: No results for input(s): "CHOL", "HDL", "LDLCALC", "TRIG", "CHOLHDL", "LDLDIRECT" in the last 72 hours. Thyroid Function Tests: No results for input(s): "TSH", "T4TOTAL", "FREET4", "T3FREE", "THYROIDAB" in the last 72 hours. Anemia Panel: No results for input(s): "VITAMINB12", "FOLATE", "FERRITIN", "TIBC", "IRON", "RETICCTPCT" in the last 72 hours. Urine analysis:    Component Value Date/Time   COLORURINE YELLOW 02/01/2023 2242   APPEARANCEUR HAZY (A)  02/01/2023 2242   LABSPEC 1.017 02/01/2023 2242   PHURINE 5.0 02/01/2023 2242   GLUCOSEU 50 (A) 02/01/2023 2242  GLUCOSEU >=1000 (A) 03/16/2020 0958   HGBUR SMALL (A) 02/01/2023 2242   BILIRUBINUR NEGATIVE 02/01/2023 2242   BILIRUBINUR Negative 12/31/2022 1222   KETONESUR NEGATIVE 02/01/2023 2242   PROTEINUR >=300 (A) 02/01/2023 2242   UROBILINOGEN 1.0 12/31/2022 1222   UROBILINOGEN 0.2 03/16/2020 0958   NITRITE NEGATIVE 02/01/2023 2242   LEUKOCYTESUR NEGATIVE 02/01/2023 2242    Radiological Exams on Admission: MR BRAIN WO CONTRAST Result Date: 02/02/2023 CLINICAL DATA:  Psychosis and abnormal cord in a shin EXAM: MRI HEAD WITHOUT CONTRAST TECHNIQUE: Multiplanar, multiecho pulse sequences of the brain and surrounding structures were obtained without intravenous contrast. COMPARISON:  11/29/2022 FINDINGS: Brain: Small foci of early subacute ischemia within the right frontal white matter (series 6, images 71-72). Chronic microhemorrhage in the left cerebellum and both temporal lobes. There is multifocal hyperintense T2-weighted signal within the white matter. Generalized volume loss. The midline structures are normal. Vascular: Normal flow voids. Skull and upper cervical spine: Normal calvarium and skull base. Visualized upper cervical spine and soft tissues are normal. Sinuses/Orbits:No paranasal sinus fluid levels or advanced mucosal thickening. No mastoid or middle ear effusion. Normal orbits. IMPRESSION: 1. Small foci of early subacute ischemia within the right frontal white matter. 2. Chronic microhemorrhage in the left cerebellum and both temporal lobes. Electronically Signed   By: Deatra Robinson M.D.   On: 02/02/2023 02:54   CT Head Wo Contrast Result Date: 02/01/2023 CLINICAL DATA:  Head trauma EXAM: CT HEAD WITHOUT CONTRAST TECHNIQUE: Contiguous axial images were obtained from the base of the skull through the vertex without intravenous contrast. RADIATION DOSE REDUCTION: This exam was  performed according to the departmental dose-optimization program which includes automated exposure control, adjustment of the mA and/or kV according to patient size and/or use of iterative reconstruction technique. COMPARISON:  11/29/2022 FINDINGS: Brain: No evidence of acute infarction, hemorrhage, mass, mass effect, or midline shift. No hydrocephalus or extra-axial fluid collection. Vascular: No hyperdense vessel. Atherosclerotic calcifications in the intracranial carotid and vertebral arteries. Skull: Negative for fracture or focal lesion. Sinuses/Orbits: No acute finding. Other: The mastoid air cells are well aerated. IMPRESSION: No acute intracranial process. Electronically Signed   By: Wiliam Ke M.D.   On: 02/01/2023 19:31   DG Chest 2 View Result Date: 02/01/2023 CLINICAL DATA:  Shortness of breath EXAM: CHEST - 2 VIEW COMPARISON:  11/24/2022 FINDINGS: Stable heart size status post sternotomy and CABG. Aortic atherosclerosis. Moderate pleural effusions, right greater than left. Associated bibasilar opacities. No pneumothorax. IMPRESSION: Moderate pleural effusions, right greater than left. Associated bibasilar opacities, atelectasis versus pneumonia. Electronically Signed   By: Duanne Guess D.O.   On: 02/01/2023 17:46    EKG: Independently reviewed.  A-fib with slow ventricular response.  Assessment and Plan  Acute on chronic HFpEF TEE done in November 2024 showing EF 60 to 65%, RV systolic function mildly reduced, trivial MVR.  Patient presenting with complaints of dyspnea, orthopnea, and bilateral lower extremity edema.  BNP 457. Chest x-ray showing moderate bilateral pleural effusions, right greater than left.  Associated bibasilar opacities, atelectasis versus pneumonia.  Patient is not hypoxic.  Given no fever or leukocytosis, pneumonia is less likely.  Will check for COVID/flu/RSV.  Check procalcitonin level.  Continue diuresis with IV Lasix 40 mg twice daily.  Monitor intake and  output, daily weights, renal function, and low-sodium diet with fluid restriction.  Visual hallucinations Subacute CVA Patient is reporting visual hallucinations for several weeks.  No signs of UTI.  Brain MRI showing small foci  of early subacute ischemia within the right frontal white matter and chronic microhemorrhage in the left cerebellum and both temporal lobes.  Neurology has been consulted, recommendations pending.  Hypertensive urgency Appears to be out of window for permissive hypertension.  Continue IV Lasix and home losartan.  IV hydralazine PRN SBP >180.   Chronic normocytic anemia Hemoglobin stable.  Mild troponin elevation Troponin mildly elevated but stable, not consistent with ACS.  Patient is not endorsing chest pain.  Suspect troponin elevation is due to demand ischemia in the setting of acutely decompensated CHF and hypertensive urgency.  Type 2 diabetes Last A1c 9.2 on 11/30/2022.  Placed on sensitive sliding scale insulin ACHS.  Hold oral hypoglycemic agents at this time.  CAD status post CABG Workup not suggestive of ACS.  A-fib with slow ventricular response Heart rate as low as 50s.  Avoid AV nodal blocking agents.  Hold Eliquis until patient is evaluated by neurology given microhemorrhage on brain MRI.  Hyperlipidemia Continue Lipitor.  Mood disorder Continue mirtazapine, paroxetine, and quetiapine.  DVT prophylaxis: Chronically anticoagulated with Eliquis, holding until patient is evaluated by neurology given microhemorrhage on brain MRI. Code Status: Full Code (discussed with the patient) Family Communication: No family available at this time. Consults called: Neurology Level of care: Progressive Care Unit Admission status: It is my clinical opinion that referral for OBSERVATION is reasonable and necessary in this patient based on the above information provided. The aforementioned taken together are felt to place the patient at high risk for further  clinical deterioration. However, it is anticipated that the patient may be medically stable for discharge from the hospital within 24 to 48 hours.  John Giovanni MD [facility]  If 7PM-7AM, please contact night-coverage [email or web address]  02/02/2023, 4:05 AM

## 2023-02-03 ENCOUNTER — Observation Stay (HOSPITAL_COMMUNITY): Payer: PPO

## 2023-02-03 DIAGNOSIS — R29701 NIHSS score 1: Secondary | ICD-10-CM | POA: Diagnosis not present

## 2023-02-03 DIAGNOSIS — I639 Cerebral infarction, unspecified: Secondary | ICD-10-CM | POA: Diagnosis not present

## 2023-02-03 DIAGNOSIS — F32A Depression, unspecified: Secondary | ICD-10-CM | POA: Diagnosis present

## 2023-02-03 DIAGNOSIS — I509 Heart failure, unspecified: Secondary | ICD-10-CM

## 2023-02-03 DIAGNOSIS — E871 Hypo-osmolality and hyponatremia: Secondary | ICD-10-CM | POA: Diagnosis present

## 2023-02-03 DIAGNOSIS — I5032 Chronic diastolic (congestive) heart failure: Secondary | ICD-10-CM

## 2023-02-03 DIAGNOSIS — I2510 Atherosclerotic heart disease of native coronary artery without angina pectoris: Secondary | ICD-10-CM | POA: Diagnosis present

## 2023-02-03 DIAGNOSIS — E861 Hypovolemia: Secondary | ICD-10-CM | POA: Diagnosis not present

## 2023-02-03 DIAGNOSIS — I110 Hypertensive heart disease with heart failure: Secondary | ICD-10-CM | POA: Diagnosis present

## 2023-02-03 DIAGNOSIS — E876 Hypokalemia: Secondary | ICD-10-CM | POA: Diagnosis present

## 2023-02-03 DIAGNOSIS — R441 Visual hallucinations: Secondary | ICD-10-CM | POA: Diagnosis present

## 2023-02-03 DIAGNOSIS — J918 Pleural effusion in other conditions classified elsewhere: Secondary | ICD-10-CM | POA: Diagnosis present

## 2023-02-03 DIAGNOSIS — R29700 NIHSS score 0: Secondary | ICD-10-CM | POA: Diagnosis not present

## 2023-02-03 DIAGNOSIS — I480 Paroxysmal atrial fibrillation: Secondary | ICD-10-CM | POA: Diagnosis present

## 2023-02-03 DIAGNOSIS — I5033 Acute on chronic diastolic (congestive) heart failure: Secondary | ICD-10-CM | POA: Diagnosis present

## 2023-02-03 DIAGNOSIS — I63511 Cerebral infarction due to unspecified occlusion or stenosis of right middle cerebral artery: Secondary | ICD-10-CM | POA: Diagnosis present

## 2023-02-03 DIAGNOSIS — I160 Hypertensive urgency: Secondary | ICD-10-CM | POA: Diagnosis present

## 2023-02-03 DIAGNOSIS — E785 Hyperlipidemia, unspecified: Secondary | ICD-10-CM | POA: Diagnosis present

## 2023-02-03 DIAGNOSIS — E1165 Type 2 diabetes mellitus with hyperglycemia: Secondary | ICD-10-CM | POA: Diagnosis present

## 2023-02-03 DIAGNOSIS — L89153 Pressure ulcer of sacral region, stage 3: Secondary | ICD-10-CM | POA: Diagnosis present

## 2023-02-03 DIAGNOSIS — E875 Hyperkalemia: Secondary | ICD-10-CM | POA: Diagnosis present

## 2023-02-03 DIAGNOSIS — F39 Unspecified mood [affective] disorder: Secondary | ICD-10-CM | POA: Diagnosis present

## 2023-02-03 DIAGNOSIS — E1159 Type 2 diabetes mellitus with other circulatory complications: Secondary | ICD-10-CM | POA: Diagnosis not present

## 2023-02-03 DIAGNOSIS — D638 Anemia in other chronic diseases classified elsewhere: Secondary | ICD-10-CM | POA: Diagnosis present

## 2023-02-03 DIAGNOSIS — E1151 Type 2 diabetes mellitus with diabetic peripheral angiopathy without gangrene: Secondary | ICD-10-CM | POA: Diagnosis present

## 2023-02-03 DIAGNOSIS — I2489 Other forms of acute ischemic heart disease: Secondary | ICD-10-CM | POA: Diagnosis present

## 2023-02-03 DIAGNOSIS — E11319 Type 2 diabetes mellitus with unspecified diabetic retinopathy without macular edema: Secondary | ICD-10-CM | POA: Diagnosis present

## 2023-02-03 DIAGNOSIS — J90 Pleural effusion, not elsewhere classified: Secondary | ICD-10-CM

## 2023-02-03 DIAGNOSIS — N179 Acute kidney failure, unspecified: Secondary | ICD-10-CM | POA: Diagnosis present

## 2023-02-03 LAB — RENAL FUNCTION PANEL
Albumin: 2.5 g/dL — ABNORMAL LOW (ref 3.5–5.0)
Anion gap: 9 (ref 5–15)
BUN: 13 mg/dL (ref 8–23)
CO2: 29 mmol/L (ref 22–32)
Calcium: 8.2 mg/dL — ABNORMAL LOW (ref 8.9–10.3)
Chloride: 99 mmol/L (ref 98–111)
Creatinine, Ser: 0.92 mg/dL (ref 0.61–1.24)
GFR, Estimated: >60 mL/min (ref >60)
Glucose, Bld: 149 mg/dL — ABNORMAL HIGH (ref 70–99)
Phosphorus: 3.7 mg/dL (ref 2.5–4.6)
Potassium: 3.9 mmol/L (ref 3.5–5.1)
Sodium: 137 mmol/L (ref 135–145)

## 2023-02-03 LAB — GLUCOSE, CAPILLARY
Glucose-Capillary: 147 mg/dL — ABNORMAL HIGH (ref 70–99)
Glucose-Capillary: 174 mg/dL — ABNORMAL HIGH (ref 70–99)
Glucose-Capillary: 166 mg/dL — ABNORMAL HIGH (ref 70–99)
Glucose-Capillary: 209 mg/dL — ABNORMAL HIGH (ref 70–99)

## 2023-02-03 LAB — BODY FLUID CELL COUNT WITH DIFFERENTIAL
Eos, Fluid: 0 %
Lymphs, Fluid: 57 %
Monocyte-Macrophage-Serous Fluid: 42 % — ABNORMAL LOW (ref 50–90)
Neutrophil Count, Fluid: 1 % (ref 0–25)
Total Nucleated Cell Count, Fluid: 160 uL (ref 0–1000)

## 2023-02-03 LAB — CBC
HCT: 27.9 % — ABNORMAL LOW (ref 39.0–52.0)
Hemoglobin: 9.1 g/dL — ABNORMAL LOW (ref 13.0–17.0)
MCH: 29.7 pg (ref 26.0–34.0)
MCHC: 32.6 g/dL (ref 30.0–36.0)
MCV: 91.2 fL (ref 80.0–100.0)
Platelets: 206 10*3/uL (ref 150–400)
RBC: 3.06 MIL/uL — ABNORMAL LOW (ref 4.22–5.81)
RDW: 15.8 % — ABNORMAL HIGH (ref 11.5–15.5)
WBC: 5.5 10*3/uL (ref 4.0–10.5)
nRBC: 0 % (ref 0.0–0.2)

## 2023-02-03 LAB — HEMOGLOBIN A1C
Hgb A1c MFr Bld: 7.8 % — ABNORMAL HIGH (ref 4.8–5.6)
Mean Plasma Glucose: 177.16 mg/dL

## 2023-02-03 LAB — LACTATE DEHYDROGENASE, PLEURAL OR PERITONEAL FLUID: LD, Fluid: 64 U/L — ABNORMAL HIGH (ref 3–23)

## 2023-02-03 LAB — PROTEIN, PLEURAL OR PERITONEAL FLUID: Total protein, fluid: <3 g/dL

## 2023-02-03 LAB — MAGNESIUM: Magnesium: 1.8 mg/dL (ref 1.7–2.4)

## 2023-02-03 MED ORDER — ENSURE ENLIVE PO LIQD
237.0000 mL | Freq: Two times a day (BID) | ORAL | Status: DC
  Administered 2023-02-04 – 2023-02-06 (×5): 237 mL via ORAL

## 2023-02-03 MED ORDER — JUVEN PO PACK
1.0000 | PACK | Freq: Two times a day (BID) | ORAL | Status: DC
  Administered 2023-02-04 – 2023-02-06 (×6): 1 via ORAL
  Filled 2023-02-03 (×7): qty 1

## 2023-02-03 MED ORDER — ADULT MULTIVITAMIN W/MINERALS CH
1.0000 | ORAL_TABLET | Freq: Every day | ORAL | Status: DC
  Administered 2023-02-04 – 2023-02-06 (×3): 1 via ORAL
  Filled 2023-02-03 (×4): qty 1

## 2023-02-03 MED ORDER — LIDOCAINE HCL (PF) 1 % IJ SOLN
INTRAMUSCULAR | Status: AC
  Filled 2023-02-03: qty 30

## 2023-02-03 MED ORDER — LIDOCAINE HCL (PF) 1 % IJ SOLN
10.0000 mL | Freq: Once | INTRAMUSCULAR | Status: AC
  Administered 2023-02-03: 10 mL via INTRADERMAL

## 2023-02-03 NOTE — Procedures (Signed)
Ultrasound-guided diagnostic and therapeutic left sided thoracentesis performed yielding 1.1 liters of straw colored fluid. No immediate complications.   Diagnostic fluid was sent to the lab for further analysis. Follow-up chest x-ray pending. EBL is < 2 ml.

## 2023-02-03 NOTE — Progress Notes (Signed)
Initial Nutrition Assessment  DOCUMENTATION CODES:      INTERVENTION:  Liberalize diet -1 packet Juven BID, each packet provides 95 calories, 2.5 grams of protein (collagen), and 9.8 grams of carbohydrate (3 grams sugar); also contains 7 grams of L-arginine and L-glutamine, 300 mg vitamin C, 15 mg vitamin E, 1.2 mcg vitamin B-12, 9.5 mg zinc, 200 mg calcium, and 1.5 g  Calcium Beta-hydroxy-Beta-methylbutyrate to support wound healing - Multivitamin with minerals   NUTRITION DIAGNOSIS:   Increased nutrient needs related to acute illness as evidenced by estimated needs.    GOAL:   Patient will meet greater than or equal to 90% of their needs    MONITOR:   PO intake  REASON FOR ASSESSMENT:   Consult Assessment of nutrition requirement/status  ASSESSMENT:   68 y.o. m , presented to ED for evaluation of visual hallucinations, shortness of breath, and weight gain. Admitted with working diagnosis of acute diastolic CHF, hypertensive urgency and subacute stroke.  PMH; chronic HFpEF, type 2 diabetes no longer on insulin due to recurrent hypoglycemia, CAD status post CABG, CVA, hypertension, PVD, paroxysmal A-fib with slow ventricular response on Eliquis, BPH, anxiety, depression, hyperlipidemia, psoriasis. Review of EMR revealed; Due to edema BLE, and weight gain, pt was started on IV lasix.  IR  thoracentesis 1.1 L fluid removed.  Weight changes can be expected in relation to fluid shifts.  Patient noted to have good appetite with no chewing or swallowing changes noted.  There is no benefit to excessive dietary restrictions related advanced age, increased nutrient needs. Patient would benefit better from liberalized diet to help meet increased needs and promote better oral intake.  Admit weight: 76.2 kg. Weight history: 02/03/23 73.3 kg  02/01/23 76.2 kg  01/18/23 72.3 kg  01/08/23 69.9 kg  01/04/23 67.4 kg  01/01/23 67.4 kg  12/28/22 68.6 kg  11/30/22 73.2 kg  11/21/22 77.1  kg  11/20/22 77.3 kg   Hospital weight history:  02/03/23 0555 73.3 kg 161.6 lbs  02/01/23 1622 76.2 kg 167.99 lbs    Average Meal Intake:  100% intake x 2 recorded meals  Nutritionally Relevant Medications: Scheduled Meds:  amLODipine  10 mg Oral Daily   apixaban  5 mg Oral BID   furosemide  40 mg Intravenous BID   losartan  50 mg Oral Daily   mirtazapine  15 mg Oral QHS   QUEtiapine  25 mg Oral QHS    Labs Reviewed:  CBG ranges from 149-190 mg/dL over the last 24 hours    NUTRITION - FOCUSED PHYSICAL EXAM:  Deferred   Diet Order:   Diet Order             Diet heart healthy/carb modified Room service appropriate? Yes; Fluid consistency: Thin  Diet effective now                   EDUCATION NEEDS:   Education needs have been addressed  Skin:  Skin Assessment: Skin Integrity Issues: Skin Integrity Issues:: Stage III Stage III: right and Left coccyx  Last BM:  02/02/23  Height:   Ht Readings from Last 1 Encounters:  02/01/23 6\' 5"  (1.956 m)    Weight:   Wt Readings from Last 1 Encounters:  02/03/23 73.3 kg    Ideal Body Weight:     BMI:  Body mass index is 19.16 kg/m.  Estimated Nutritional Needs:   Kcal:  2210-2575 kcal  Protein:  100-120 g  Fluid:  1850-2300 ml  [name] [name] RDN, LDN Clinical Dietitian   If unable to reach, please contact "[facility] Inpatient" secure chat group between 8 am-4 pm daily"

## 2023-02-03 NOTE — Progress Notes (Signed)
[name]                                                                               [name] [name], is a 68 y.o. male, DOB - [birth year], WUJ:[id number] Admit date - 02/01/2023    Outpatient Primary MD for the patient is [name], [name] M, PA-C  LOS - 0  days    Brief summary   68 year old M with PMH of diastolic CHF, DM-2, CAD/CABG, CVA, HTN, PVD, paroxysmal A-fib with slow ventricular response on Eliquis, BPH, anxiety, depression, HLD and psoriasis sent to ED by PCP due to shortness of breath, cough, orthopnea, edema, weight gain and visual hallucination, and admitted with working diagnosis of acute diastolic CHF, hypertensive urgency and subacute stroke.  CXR showed moderate bilateral pleural effusion, right> left.  CT head without acute finding.  MRI brain showed small foci of early subacute ischemia within the right frontal white matter and chronic microhemorrhage in the left cerebellum and both temporal lobes.  Neurology consulted, recommended outpatient neurology follow up. CT angio head and neck without significant finding. Eliquis started. IR consulted for thoracocentesis. COVID-19 PCR non reactive. 20 pathogen RVP nonreactive    Assessment & Plan    Assessment and Plan:  Acute on chronic HFpEF Improving. Patient presented with dyspnea, orthopnea, BLE edema and weight.  CXR showing mod bil effusions.  BNP of 457.  He was started on IV lasix every 12 hours.  IR consulted and pt underwent thoracentesis and 1.1 lit of fluid removed.  Continue with strict intake and output, daily weights.   Echocardiogram shows preserved LVEF without any wall motion abnormalities.  Creatinine improving with IV diuresis.    Moderate bilateral pleural effusions S/p thoracentesis and fluid sent for analysis.    Sub acute CVA No focal deficits on exam.  Neurology consulted, recommended outpatient follow up .  Continue with Eliquis and statin.   Therapy evaluations are  pending.     Hypertensive Urgency Probably secondary to non compliance to medications.  Restart home meds .  Currently on norvasc 10 mg daily, and losartan 50 mg daily . Continue with hydralazine prn.    PAF\ Rate well controlled.  Not on AV nodal blocking agents.  Continue with Eliquis.   Elevated troponin/history of CAD/CABG: No chest pain.  Mildly elevated troponin likely demand ischemia -Continue home statin and cardiac meds   Anemia of chronic disease;  Stable.    Type 2 DM with hyperglycemia non insulin dependent.  CBG (last 3)  Recent Labs    02/02/23 2247 02/03/23 0634 02/03/23 1148  GLUCAP 141* 147* 174*   Better controlled cbg's.  A1c is 9.2% in 11/2022.  Continue with Tradjenta.  Continue with SSI.     Mood disorder/ visual hallucinations Appears to have resolved.  Resume paroxetine and quetiapine.    Hypokalemia Replaced.    Hyperlipidemia Resume lipitor.        RN Pressure Injury Documentation: Pressure Injury 05/23/20 Sacrum Stage 2 -  Partial thickness loss of dermis presenting as a shallow open injury with a red, pink wound bed without slough. (Active)  05/23/20 1000  Location: Sacrum  Location Orientation:   Staging:  Stage 2 -  Partial thickness loss of dermis presenting as a shallow open injury with a red, pink wound bed without slough.  Wound Description (Comments):   Present on Admission:      Pressure Injury 02/02/23 Coccyx Right Stage 3 -  Full thickness tissue loss. Subcutaneous fat may be visible but bone, tendon or muscle are NOT exposed. (Active)  02/02/23 2300  Location: Coccyx  Location Orientation: Right  Staging: Stage 3 -  Full thickness tissue loss. Subcutaneous fat may be visible but bone, tendon or muscle are NOT exposed.  Wound Description (Comments):   Present on Admission: Yes  Dressing Type Foam - Lift dressing to assess site every shift 02/02/23 2300     Pressure Injury 02/02/23 Coccyx Left Stage 3 -   Full thickness tissue loss. Subcutaneous fat may be visible but bone, tendon or muscle are NOT exposed. (Active)  02/02/23 2300  Location: Coccyx  Location Orientation: Left  Staging: Stage 3 -  Full thickness tissue loss. Subcutaneous fat may be visible but bone, tendon or muscle are NOT exposed.  Wound Description (Comments):   Present on Admission: Yes  Dressing Type Foam - Lift dressing to assess site every shift 02/02/23 2300    Estimated body mass index is 19.16 kg/m as calculated from the following:   Height as of this encounter: 6\' 5"  (1.956 m).   Weight as of this encounter: 73.3 kg.  Code Status:  full code DVT Prophylaxis:   apixaban (ELIQUIS) tablet 5 mg   Level of Care: Level of care: Progressive Family Communication:   Disposition Plan:     Remains inpatient appropriate:  waiting for the fluid analysis and therapy evaluations.   Procedures:  Echocardiogram.  Consultants:   Neurology.   Antimicrobials:   Anti-infectives (From admission, onward)    None        Medications  Scheduled Meds:  amLODipine  10 mg Oral Daily   apixaban  5 mg Oral BID   atorvastatin  40 mg Oral QHS   furosemide  40 mg Intravenous BID   insulin aspart  0-5 Units Subcutaneous QHS   insulin aspart  0-9 Units Subcutaneous TID WC   linagliptin  5 mg Oral Daily   losartan  50 mg Oral Daily   mirtazapine  15 mg Oral QHS   PARoxetine  20 mg Oral Daily   QUEtiapine  25 mg Oral QHS   Continuous Infusions: PRN Meds:.hydrALAZINE, mouth rinse    Subjective:   Theotis Gerdeman was seen and examined today.  Pt appears to be back to baseline.   Objective:   Vitals:   02/03/23 0843 02/03/23 0850 02/03/23 1000 02/03/23 1100  BP: 118/71 109/65 (!) 156/84 120/71  Pulse:      Resp:      Temp:      TempSrc:      SpO2:      Weight:      Height:        Intake/Output Summary (Last 24 hours) at 02/03/2023 1245 Last data filed at 02/03/2023 0855 Gross per 24 hour  Intake 480 ml   Output 550 ml  Net -70 ml   Filed Weights   02/01/23 1622 02/03/23 0555  Weight: 76.2 kg 73.3 kg     Exam General exam: Appears calm and comfortable  Respiratory system: Clear to auscultation. Respiratory effort normal. Cardiovascular system: S1 & S2 heard, RRR. No JVD Gastrointestinal system: Abdomen is nondistended, soft and nontender.  Central nervous system:  Alert and oriented. No focal neurological deficits. Extremities: Symmetric 5 x 5 power. Skin: No rashes, lesions or ulcers Psychiatry: mood is appropriate.    Data Reviewed:  I have personally reviewed following labs and imaging studies   CBC Lab Results  Component Value Date   WBC 5.5 02/03/2023   RBC 3.06 (L) 02/03/2023   HGB 9.1 (L) 02/03/2023   HCT 27.9 (L) 02/03/2023   MCV 91.2 02/03/2023   MCH 29.7 02/03/2023   PLT 206 02/03/2023   MCHC 32.6 02/03/2023   RDW 15.8 (H) 02/03/2023   LYMPHSABS 1.2 12/28/2022   MONOABS 0.5 12/28/2022   EOSABS 0.1 12/28/2022   BASOSABS 0.1 12/28/2022     Last metabolic panel Lab Results  Component Value Date   NA 137 02/03/2023   K 3.9 02/03/2023   CL 99 02/03/2023   CO2 29 02/03/2023   BUN 13 02/03/2023   CREATININE 0.92 02/03/2023   GLUCOSE 149 (H) 02/03/2023   GFRNONAA >60 02/03/2023   GFRAA >60 06/09/2018   CALCIUM 8.2 (L) 02/03/2023   PHOS 3.7 02/03/2023   PROT 6.0 (L) 02/02/2023   ALBUMIN 2.5 (L) 02/03/2023   BILITOT 0.6 02/02/2023   ALKPHOS 119 02/02/2023   AST 18 02/02/2023   ALT 12 02/02/2023   ANIONGAP 9 02/03/2023    CBG (last 3)  Recent Labs    02/02/23 2247 02/03/23 0634 02/03/23 1148  GLUCAP 141* 147* 174*      Coagulation Profile: No results for input(s): "INR", "PROTIME" in the last 168 hours.   Radiology Studies: US THORACENTESIS ASP PLEURAL SPACE W/IMG GUIDE Result Date: 02/03/2023 INDICATION: History of CHF. Admitted for shortness of breath, lower extremity swelling and altered mental status. Request is for therapeutic and  diagnostic thoracentesis for further evaluation. EXAM: ULTRASOUND GUIDED THERAPEUTIC AND DIAGNOSTIC LEFT-SIDED THORACENTESIS MEDICATIONS: Lidocaine 1% 10 mL COMPLICATIONS: None immediate. PROCEDURE: An ultrasound guided thoracentesis was thoroughly discussed with the patient and questions answered. The benefits, risks, alternatives and complications were also discussed. The patient understands and wishes to proceed with the procedure. Written consent was obtained. Ultrasound was performed to localize and mark an adequate pocket of fluid in the left chest. The area was then prepped and draped in the normal sterile fashion. 1% Lidocaine was used for local anesthesia. Under ultrasound guidance a 6 Fr Safe-T-Centesis catheter was introduced. Thoracentesis was performed. The catheter was removed and a dressing applied. FINDINGS: A total of approximately 1.1 L of straw-colored fluid was removed. Samples were sent to the laboratory as requested by the clinical team. IMPRESSION: Successful ultrasound guided therapeutic and diagnostic left-sided thoracentesis yielding 1.1 L of pleural fluid. Performed by Anders Grant NP Electronically Signed   By: Richarda Overlie M.D.   On: 02/03/2023 12:23   DG Chest 1 View Result Date: 02/03/2023 CLINICAL DATA:  Pleural effusion, status post left thoracentesis. EXAM: CHEST  1 VIEW COMPARISON:  02/01/2023 FINDINGS: Apical lordotic projection. Interval reduction in left pleural fluid. No appreciable pneumothorax. Vertically oblique lucency projecting over the left hemithorax attributed to skin fold/wrinkles. There is still some blunting of the left lateral costophrenic angle. Moderate to large right pleural effusion. Prior CABG. Atherosclerotic calcification of the aortic arch. IMPRESSION: 1. Interval reduction in left pleural fluid. No appreciable pneumothorax. 2. Moderate to large right pleural effusion. 3. Prior CABG. 4. Aortic Atherosclerosis (ICD10-I70.0). Electronically Signed   By:  Gaylyn Rong M.D.   On: 02/03/2023 09:17   ECHOCARDIOGRAM LIMITED Result Date: 02/02/2023    ECHOCARDIOGRAM LIMITED  REPORT   Patient Name:   [name] [name] Date of Exam: 02/02/2023 Medical Rec #:  [id number]            Height:       77.0 in Accession #:    [id number]           Weight:       168.0 lb Date of Birth:  02/12/1955             BSA:          2.077 m Patient Age:    67 years             BP:           163/82 mmHg Patient Gender: M                    HR:           57 bpm. Exam Location:  Inpatient Procedure: Limited Echo and Limited Color Doppler Indications:    CHF- Acute Diastolic I50.31  History:        Patient has prior history of Echocardiogram examinations, most                 recent 11/21/2022. CHF, CAD, Prior CABG, Stroke,                 Arrythmias:Bradycardia and Atrial Fibrillation; Risk                 Factors:Hypertension, Diabetes and Dyslipidemia.  Sonographer:    Lucendia Herrlich RCS Referring Phys: [id number] TAYE T GONFA IMPRESSIONS  1. Left ventricular ejection fraction, by estimation, is 60 to 65%. Left ventricular ejection fraction by PLAX is 63 %. The left ventricle has normal function. The left ventricle has no regional wall motion abnormalities. There is mild asymmetric left ventricular hypertrophy of the basal-septal segment.  2. Right ventricular systolic function is normal. The right ventricular size is normal. There is moderately elevated pulmonary artery systolic pressure. The estimated right ventricular systolic pressure is 45.9 mmHg.  3. The mitral valve is abnormal. Mild to moderate mitral valve regurgitation. Moderate mitral annular calcification.  4. The aortic valve is tricuspid. Aortic valve regurgitation is not visualized. Aortic valve sclerosis/calcification is present, without any evidence of aortic stenosis.  5. Aortic dilatation noted. There is borderline dilatation of the ascending aorta, measuring 38 mm.  6. The inferior vena cava is dilated in size with <50%  respiratory variability, suggesting right atrial pressure of 15 mmHg. Comparison(s): Changes from prior study are noted. 11/21/2022: LVEF 55-60%, RVSP 32.6 mmHg. FINDINGS  Left Ventricle: Left ventricular ejection fraction, by estimation, is 60 to 65%. Left ventricular ejection fraction by PLAX is 63 %. The left ventricle has normal function. The left ventricle has no regional wall motion abnormalities. The left ventricular internal cavity size was normal in size. There is mild asymmetric left ventricular hypertrophy of the basal-septal segment. Right Ventricle: The right ventricular size is normal. No increase in right ventricular wall thickness. Right ventricular systolic function is normal. There is moderately elevated pulmonary artery systolic pressure. The tricuspid regurgitant velocity is 2.78 m/s, and with an assumed right atrial pressure of 15 mmHg, the estimated right ventricular systolic pressure is 45.9 mmHg. Mitral Valve: The mitral valve is abnormal. There is moderate calcification of the anterior mitral valve leaflet(s). Moderate mitral annular calcification. Mild to moderate mitral valve regurgitation. Tricuspid Valve: The tricuspid valve is grossly normal. Tricuspid valve regurgitation is trivial. Aortic  Valve: The aortic valve is tricuspid. Aortic valve regurgitation is not visualized. Aortic valve sclerosis/calcification is present, without any evidence of aortic stenosis. Aortic valve peak gradient measures 3.1 mmHg. Aorta: Aortic dilatation noted. There is borderline dilatation of the ascending aorta, measuring 38 mm. Venous: The inferior vena cava is dilated in size with less than 50% respiratory variability, suggesting right atrial pressure of 15 mmHg. LEFT VENTRICLE PLAX 2D LV EF:         Left            Diastology                ventricular     LV e' medial:    7.53 cm/s                ejection        LV E/e' medial:  15.3                fraction by     LV e' lateral:   9.73 cm/s                 PLAX is 63      LV E/e' lateral: 11.8                %. LVIDd:         3.90 cm LVIDs:         2.60 cm LV PW:         1.00 cm LV IVS:        1.20 cm LVOT diam:     2.10 cm LV SV:         53 LV SV Index:   26 LVOT Area:     3.46 cm  RIGHT VENTRICLE             IVC RV S prime:     13.90 cm/s  IVC diam: 2.40 cm TAPSE (M-mode): 1.5 cm LEFT ATRIUM         Index LA diam:    4.95 cm 2.38 cm/m  AORTIC VALVE AV Area (Vmax): 2.67 cm AV Vmax:        88.30 cm/s AV Peak Grad:   3.1 mmHg LVOT Vmax:      68.10 cm/s LVOT Vmean:     43.500 cm/s LVOT VTI:       0.154 m  AORTA Ao Root diam: 4.15 cm Ao Asc diam:  3.80 cm MITRAL VALVE                TRICUSPID VALVE MV Area (PHT): 3.89 cm     TR Peak grad:   30.9 mmHg MV Decel Time: 195 msec     TR Vmax:        278.00 cm/s MV E velocity: 115.00 cm/s                             SHUNTS                             Systemic VTI:  0.15 m                             Systemic Diam: 2.10 cm Zoila Shutter MD Electronically signed by Zoila Shutter MD Signature Date/Time: 02/02/2023/3:37:38 PM    Final    CT ANGIO HEAD NECK W WO  CM Result Date: 02/02/2023 CLINICAL DATA:  Stroke/TIA, determine embolic source. Small recent right frontal white matter infarcts on MRI. EXAM: CT ANGIOGRAPHY HEAD AND NECK WITH AND WITHOUT CONTRAST TECHNIQUE: Multidetector CT imaging of the head and neck was performed using the standard protocol during bolus administration of intravenous contrast. Multiplanar CT image reconstructions and MIPs were obtained to evaluate the vascular anatomy. Carotid stenosis measurements (when applicable) are obtained utilizing NASCET criteria, using the distal internal carotid diameter as the denominator. RADIATION DOSE REDUCTION: This exam was performed according to the departmental dose-optimization program which includes automated exposure control, adjustment of the mA and/or kV according to patient size and/or use of iterative reconstruction technique. CONTRAST:  75mL OMNIPAQUE  IOHEXOL 350 MG/ML SOLN COMPARISON:  Head MRI 02/02/2023, head CT 02/01/2023, and head and neck CTA 11/29/2022 FINDINGS: CT HEAD FINDINGS Brain: There is no evidence of an acute cortically based infarct, intracranial hemorrhage, mass, midline shift, or extra-axial fluid collection. The recent punctate right cerebral hemispheric white matter infarcts on MRI are largely occult by CT. There is a background of mild chronic small vessel ischemia in the cerebral white matter. There is mild cerebral atrophy. Vascular: Calcified atherosclerosis at the skull base. Skull: No acute fracture or suspicious osseous lesion. Sinuses/Orbits: The paranasal sinuses and mastoid air cells are clear. Left cataract extraction. Other: None. Review of the MIP images confirms the above findings CTA NECK FINDINGS Aortic arch: Standard branching with mild-to-moderate atherosclerotic plaque in the distal arch. No significant stenosis of the arch vessel origins. Right carotid system: Patent with a moderate amount of mixed calcified and soft plaque at the carotid bifurcation resulting in less than 50% stenosis of the ICA origin, unchanged. Left carotid system: Patent with a moderate amount of mixed calcified and soft plaque in the carotid bulb not resulting in a significant stenosis, unchanged. Vertebral arteries: Patent and codominant without evidence of a stenosis, dissection, or significant atherosclerosis. Skeleton: Focally advanced disc degeneration at C5-6 and C6-7. Asymmetric left-sided facet arthrosis at multiple levels. Other neck: No evidence of cervical lymphadenopathy or mass. Upper chest: Partially visualized moderate bilateral pleural effusions, right larger than left, with associated atelectasis. Review of the MIP images confirms the above findings CTA HEAD FINDINGS Anterior circulation: The internal carotid arteries are patent from skull base to carotid termini with calcified plaque resulting in mild cavernous and supraclinoid  stenoses bilaterally, unchanged. A 2 mm protrusion from the right supraclinoid ICA is unchanged and may reflect a small aneurysm or infundibulum (series 13, image 125). ACAs and MCAs are patent without evidence of a proximal branch occlusion or significant proximal stenosis. Posterior circulation: The intracranial vertebral arteries are patent to the basilar with calcified plaque resulting in mild-to-moderate right and mild left V4 stenoses, unchanged. Patent PICA and SCA origins are visualized bilaterally. The basilar artery is widely patent. Posterior communicating arteries are diminutive or absent. Both PCAs are patent with branch vessel irregularity but no evidence of a significant proximal stenosis. No aneurysm is identified. Venous sinuses: Poorly evaluated due to contrast timing. Anatomic variants: None. Review of the MIP images confirms the above findings IMPRESSION: 1. Unchanged atherosclerosis in the head and neck without a large vessel occlusion. 2. Mild bilateral intracranial ICA stenoses and mild-to-moderate right and mild left V4 stenoses. 3. 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 4. Moderate bilateral pleural effusions. Electronically Signed   By: Sebastian Ache M.D.   On: 02/02/2023 12:20   MR BRAIN WO CONTRAST Result Date: 02/02/2023 CLINICAL  DATA:  Psychosis and abnormal cord in a shin EXAM: MRI HEAD WITHOUT CONTRAST TECHNIQUE: Multiplanar, multiecho pulse sequences of the brain and surrounding structures were obtained without intravenous contrast. COMPARISON:  11/29/2022 FINDINGS: Brain: Small foci of early subacute ischemia within the right frontal white matter (series 6, images 71-72). Chronic microhemorrhage in the left cerebellum and both temporal lobes. There is multifocal hyperintense T2-weighted signal within the white matter. Generalized volume loss. The midline structures are normal. Vascular: Normal flow voids. Skull and upper cervical spine: Normal calvarium  and skull base. Visualized upper cervical spine and soft tissues are normal. Sinuses/Orbits:No paranasal sinus fluid levels or advanced mucosal thickening. No mastoid or middle ear effusion. Normal orbits. IMPRESSION: 1. Small foci of early subacute ischemia within the right frontal white matter. 2. Chronic microhemorrhage in the left cerebellum and both temporal lobes. Electronically Signed   By: Deatra Robinson M.D.   On: 02/02/2023 02:54   CT Head Wo Contrast Result Date: 02/01/2023 CLINICAL DATA:  Head trauma EXAM: CT HEAD WITHOUT CONTRAST TECHNIQUE: Contiguous axial images were obtained from the base of the skull through the vertex without intravenous contrast. RADIATION DOSE REDUCTION: This exam was performed according to the departmental dose-optimization program which includes automated exposure control, adjustment of the mA and/or kV according to patient size and/or use of iterative reconstruction technique. COMPARISON:  11/29/2022 FINDINGS: Brain: No evidence of acute infarction, hemorrhage, mass, mass effect, or midline shift. No hydrocephalus or extra-axial fluid collection. Vascular: No hyperdense vessel. Atherosclerotic calcifications in the intracranial carotid and vertebral arteries. Skull: Negative for fracture or focal lesion. Sinuses/Orbits: No acute finding. Other: The mastoid air cells are well aerated. IMPRESSION: No acute intracranial process. Electronically Signed   By: Wiliam Ke M.D.   On: 02/01/2023 19:31   DG Chest 2 View Result Date: 02/01/2023 CLINICAL DATA:  Shortness of breath EXAM: CHEST - 2 VIEW COMPARISON:  11/24/2022 FINDINGS: Stable heart size status post sternotomy and CABG. Aortic atherosclerosis. Moderate pleural effusions, right greater than left. Associated bibasilar opacities. No pneumothorax. IMPRESSION: Moderate pleural effusions, right greater than left. Associated bibasilar opacities, atelectasis versus pneumonia. Electronically Signed   By: Duanne Guess D.O.    On: 02/01/2023 17:46       Kathlen Mody M.D. [name] 02/03/2023, 12:45 PM  Available via Epic secure chat 7am-7pm After 7 pm, please refer to night coverage provider listed on amion.

## 2023-02-03 NOTE — Plan of Care (Signed)
  Problem: Education: Goal: Knowledge of disease or condition will improve Outcome: Progressing Goal: Knowledge of secondary prevention will improve (MUST DOCUMENT ALL) Outcome: Progressing Goal: Knowledge of patient specific risk factors will improve (DELETE if not current risk factor) Outcome: Progressing   Problem: Ischemic Stroke/TIA Tissue Perfusion: Goal: Complications of ischemic stroke/TIA will be minimized Outcome: Progressing   Problem: Coping: Goal: Will verbalize positive feelings about self Outcome: Progressing Goal: Will identify appropriate support needs Outcome: Progressing   Problem: Health Behavior/Discharge Planning: Goal: Ability to manage health-related needs will improve Outcome: Progressing Goal: Goals will be collaboratively established with patient/family Outcome: Progressing   Problem: Self-Care: Goal: Ability to participate in self-care as condition permits will improve Outcome: Progressing Goal: Verbalization of feelings and concerns over difficulty with self-care will improve Outcome: Progressing Goal: Ability to communicate needs accurately will improve Outcome: Progressing   Problem: Nutrition: Goal: Risk of aspiration will decrease Outcome: Progressing Goal: Dietary intake will improve Outcome: Progressing   Problem: Education: Goal: Ability to describe self-care measures that may prevent or decrease complications (Diabetes Survival Skills Education) will improve Outcome: Progressing Goal: Individualized Educational Video(s) Outcome: Progressing   Problem: Coping: Goal: Ability to adjust to condition or change in health will improve Outcome: Progressing   Problem: Fluid Volume: Goal: Ability to maintain a balanced intake and output will improve Outcome: Progressing   Problem: Health Behavior/Discharge Planning: Goal: Ability to identify and utilize available resources and services will improve Outcome: Progressing Goal: Ability to  manage health-related needs will improve Outcome: Progressing   Problem: Metabolic: Goal: Ability to maintain appropriate glucose levels will improve Outcome: Progressing   Problem: Nutritional: Goal: Maintenance of adequate nutrition will improve Outcome: Progressing Goal: Progress toward achieving an optimal weight will improve Outcome: Progressing   Problem: Skin Integrity: Goal: Risk for impaired skin integrity will decrease Outcome: Progressing   Problem: Tissue Perfusion: Goal: Adequacy of tissue perfusion will improve Outcome: Progressing   Problem: Education: Goal: Knowledge of General Education information will improve Description: Including pain rating scale, medication(s)/side effects and non-pharmacologic comfort measures Outcome: Progressing   Problem: Health Behavior/Discharge Planning: Goal: Ability to manage health-related needs will improve Outcome: Progressing   Problem: Clinical Measurements: Goal: Ability to maintain clinical measurements within normal limits will improve Outcome: Progressing Goal: Will remain free from infection Outcome: Progressing Goal: Diagnostic test results will improve Outcome: Progressing Goal: Respiratory complications will improve Outcome: Progressing Goal: Cardiovascular complication will be avoided Outcome: Progressing   Problem: Activity: Goal: Risk for activity intolerance will decrease Outcome: Progressing   Problem: Nutrition: Goal: Adequate nutrition will be maintained Outcome: Progressing   Problem: Coping: Goal: Level of anxiety will decrease Outcome: Progressing   Problem: Elimination: Goal: Will not experience complications related to bowel motility Outcome: Progressing Goal: Will not experience complications related to urinary retention Outcome: Progressing   Problem: Pain Managment: Goal: General experience of comfort will improve and/or be controlled Outcome: Progressing   Problem:  Safety: Goal: Ability to remain free from injury will improve Outcome: Progressing   Problem: Skin Integrity: Goal: Risk for impaired skin integrity will decrease Outcome: Progressing

## 2023-02-04 ENCOUNTER — Inpatient Hospital Stay (HOSPITAL_COMMUNITY): Payer: PPO

## 2023-02-04 ENCOUNTER — Other Ambulatory Visit: Payer: Self-pay

## 2023-02-04 DIAGNOSIS — I739 Peripheral vascular disease, unspecified: Secondary | ICD-10-CM

## 2023-02-04 DIAGNOSIS — I639 Cerebral infarction, unspecified: Secondary | ICD-10-CM | POA: Diagnosis not present

## 2023-02-04 DIAGNOSIS — E1159 Type 2 diabetes mellitus with other circulatory complications: Secondary | ICD-10-CM | POA: Diagnosis not present

## 2023-02-04 DIAGNOSIS — I5033 Acute on chronic diastolic (congestive) heart failure: Secondary | ICD-10-CM | POA: Diagnosis not present

## 2023-02-04 DIAGNOSIS — R441 Visual hallucinations: Secondary | ICD-10-CM | POA: Diagnosis not present

## 2023-02-04 HISTORY — PX: IR THORACENTESIS ASP PLEURAL SPACE W/IMG GUIDE: IMG5380

## 2023-02-04 LAB — BASIC METABOLIC PANEL
Anion gap: 10 (ref 5–15)
BUN: 20 mg/dL (ref 8–23)
CO2: 29 mmol/L (ref 22–32)
Calcium: 8.3 mg/dL — ABNORMAL LOW (ref 8.9–10.3)
Chloride: 102 mmol/L (ref 98–111)
Creatinine, Ser: 1.18 mg/dL (ref 0.61–1.24)
GFR, Estimated: >60 mL/min (ref >60)
Glucose, Bld: 225 mg/dL — ABNORMAL HIGH (ref 70–99)
Potassium: 4.3 mmol/L (ref 3.5–5.1)
Sodium: 141 mmol/L (ref 135–145)

## 2023-02-04 LAB — GLUCOSE, CAPILLARY
Glucose-Capillary: 175 mg/dL — ABNORMAL HIGH (ref 70–99)
Glucose-Capillary: 237 mg/dL — ABNORMAL HIGH (ref 70–99)
Glucose-Capillary: 258 mg/dL — ABNORMAL HIGH (ref 70–99)
Glucose-Capillary: 213 mg/dL — ABNORMAL HIGH (ref 70–99)

## 2023-02-04 LAB — PATHOLOGIST SMEAR REVIEW

## 2023-02-04 MED ORDER — LIDOCAINE HCL 1 % IJ SOLN
10.0000 mL | Freq: Once | INTRAMUSCULAR | Status: DC
  Filled 2023-02-04: qty 10

## 2023-02-04 MED ORDER — SPIRONOLACTONE 25 MG PO TABS
25.0000 mg | ORAL_TABLET | Freq: Every day | ORAL | Status: DC
  Administered 2023-02-04: 25 mg via ORAL
  Filled 2023-02-04: qty 1

## 2023-02-04 MED ORDER — ACETAMINOPHEN 325 MG PO TABS
650.0000 mg | ORAL_TABLET | Freq: Four times a day (QID) | ORAL | Status: DC | PRN
  Administered 2023-02-05 (×2): 650 mg via ORAL
  Filled 2023-02-04 (×2): qty 2

## 2023-02-04 MED ORDER — FUROSEMIDE 40 MG PO TABS: 40.0000 mg | ORAL_TABLET | Freq: Every day | ORAL | Status: DC

## 2023-02-04 MED ORDER — INSULIN GLARGINE-YFGN 100 UNIT/ML ~~LOC~~ SOLN
6.0000 [IU] | Freq: Every day | SUBCUTANEOUS | Status: DC
  Administered 2023-02-04 – 2023-02-06 (×3): 6 [IU] via SUBCUTANEOUS
  Filled 2023-02-04 (×4): qty 0.06

## 2023-02-04 MED ORDER — FUROSEMIDE 10 MG/ML IJ SOLN
40.0000 mg | Freq: Two times a day (BID) | INTRAMUSCULAR | Status: AC
  Administered 2023-02-04: 40 mg via INTRAVENOUS
  Filled 2023-02-04: qty 4

## 2023-02-04 MED ORDER — LIDOCAINE HCL 1 % IJ SOLN
INTRAMUSCULAR | Status: AC
  Filled 2023-02-04: qty 20

## 2023-02-04 NOTE — Progress Notes (Signed)
Heart Failure Navigator Progress Note  Assessed for Heart & Vascular TOC clinic readiness.  Patient does not meet criteria due to EF 60-65%, has a scheduled [facility] appointment on 02/07/23.   Navigator will sign off at this time.   Rhae Hammock, BSN, Scientist, clinical (histocompatibility and immunogenetics) Only

## 2023-02-04 NOTE — Progress Notes (Signed)
PROGRESS NOTE  [name] [name] ZOX:[id number] DOB: 1955/10/18   PCP: Bary Leriche, PA-C  Patient is from: Home.  Lives alone.  Uses rollator at baseline.  DOA: 02/01/2023 LOS: 1  Chief complaints Chief Complaint  Patient presents with   Altered Mental Status     Brief Narrative / Interim history: 68 year old M with PMH of diastolic CHF, DM-2, CAD/CABG, CVA, HTN, PVD, paroxysmal A-fib with slow ventricular response on Eliquis, BPH, anxiety, depression, HLD and psoriasis sent to ED by PCP due to shortness of breath, cough, orthopnea, edema, weight gain and visual hallucination, and admitted with working diagnosis of acute diastolic CHF, hypertensive urgency and subacute stroke.  In ED, SBP as high as 213.  DBP as high as 105.  HR in 50s.  CBC, BMP, UA and UDS without significant finding other than proteinuria on UA.  BNP 458.  Troponin 29> 27.  CXR showed moderate bilateral pleural effusion, right> left.  CT head without acute finding.  MRI brain showed small foci of early subacute ischemia within the right frontal white matter and chronic microhemorrhage in the left cerebellum and both temporal lobes.  Neurology consulted.  Patient was started on Lasix and antihypertensive meds and admitted.  Small foci of early subacute ischemia within the right frontal white matter.  The next day, evaluated by neurology.  CT angio head and neck without significant finding.  Eliquis started.  Underwent left thoracocentesis with removal of 1.1 L transudative fluid.  Remains on IV Lasix.  Right thoracocentesis ordered.   Subjective: Seen and examined earlier this morning.  No major events overnight of this morning.  No complaints.  Denies chest pain, shortness of breath, GI or UTI symptoms.  Objective: Vitals:   02/04/23 0034 02/04/23 0421 02/04/23 0745 02/04/23 1205  BP: 118/75 (!) 155/75 108/68 (!) 158/74  Pulse: (!) 56 (!) 58 (!) 51   Resp: 20 20 18    Temp: 98.4 F (36.9 C) 98.4 F (36.9  C) 97.7 F (36.5 C) 97.8 F (36.6 C)  TempSrc: Oral Oral Oral   SpO2: 95% 97% 97%   Weight: 71.7 kg     Height:        Examination:  GENERAL: No apparent distress.  Nontoxic.  Appears frail. HEENT: MMM.  Vision and hearing grossly intact.  NECK: Supple.  No apparent JVD.  RESP:  No IWOB.  Fair aeration bilaterally. CVS:  RRR. Heart sounds normal.  ABD/GI/GU: BS+. Abd soft, NTND.  MSK/EXT:   No apparent deformity. Moves extremities.  Significant muscle mass and subcu fat loss. SKIN: no apparent skin lesion or wound NEURO: Awake and alert. Oriented appropriately.  No apparent focal neuro deficit. PSYCH: Calm. Normal affect.   Procedures:  None  Microbiology summarized: COVID-19 PCR nonreactive A 20 pathogen RVP nonreactive  Assessment and plan: Acute on chronic HFpEF: Limited TTE with LVEF of 60 to 65%, and RVSP of 46 mmHg.  Presents with dyspnea, orthopnea, BLE edema and weight gain.  BNP 457.  CXR showed moderate bilateral pleural effusion, right> left.  Started on IV Lasix.  I&O incomplete.  Net -1.2 L.  Symptoms resolved.  Cr stable. -S/p left thoracocentesis with removal of 1.1 L transudative fluid on 2/2. -Continue IV Lasix 40 mg twice daily today and transition to p.o. Lasix tomorrow -IR consulted for right thoracocentesis. -Strict intake and output, daily weight, renal functions and electrolytes -Follow echocardiogram    Subacute CVA: Hallucinations seems to have resolved.  No focal neurosymptoms or deficits on  exam.  MRI brain showed small foci of early subacute ischemia within the right frontal white matter and chronic microhemorrhage in the left cerebellum and both temporal lobes.  CT angio without LVO major stenosis.  A1c 7.8%.  His LDL was 52 in 11/2022. -Neurology signed off and recommended continuing Eliquis.    Hypertensive urgency: SBP as high as 213 and DBP/105 in ED. normotensive for most part. -Diuretics as above. -Continue home amlodipine, losartan and  Aldactone. -P.o. hydralazine as needed -Avoid nodal blocking agent given bradycardia  Bilateral pleural effusion: X-ray showed moderate bilateral pleural effusion, right> left. -S/p left thoracocentesis with removal of 1.1 L transudative fluid on 2/2 -S/p right thoracocentesis with removal of 1.8 L clear straw-colored pleural fluid on 2/3 -Diuretics as above  Paroxysmal A-fib with bradycardia: HR in 50s-60s. -Avoid nodal blocking agents -Continue Eliquis  Elevated troponin/history of CAD/CABG: No chest pain.  Mildly elevated troponin likely demand ischemia -Continue home statin and cardiac meds.   Chronic normocytic anemia: Stable -Continue monitoring   Uncontrolled DM-2 with hyperglycemia/proteinuria: A1c 9.2% in 11/2022.  On glipizide and Januvia at home. Recent Labs  Lab 02/03/23 1148 02/03/23 1619 02/03/23 2123 02/04/23 0618 02/04/23 1202  GLUCAP 174* 166* 209* 175* 237*  -Continue current insulin regimen-SSI-sensitive scale -Tradjenta and steroid from Januvia -Add Semglee 6 units daily -Hold glipizide -Continue home losartan. -Appreciate help by diabetic coordinator.   Mood disorder/visual hallucination: Hallucinations seems to have resolved.  UDS negative. -Continue mirtazapine, paroxetine, and quetiapine.  Physical deconditioning: Lives alone.  Uses rollator at baseline -PT OT recommended HH PT.  Hypokalemia/hypomagnesemia -Monitor and replenish as appropriate  Hyperlipidemia -Continue Lipitor.  Nutrition Body mass index is 18.74 kg/m. -Consult dietitian   DVT prophylaxis:   apixaban (ELIQUIS) tablet 5 mg  Code Status: Full code Family Communication: None at bedside Level of care: Telemetry Medical Status is: Inpatient The patient will remain inpatient because: Acute CHF, hypertensive urgency, stroke   Final disposition: Home with home health in the next 24 to 48 hours. Consultants:  Neurology  55 minutes with more than 50% spent in reviewing  records, counseling patient/family and coordinating care.   Sch Meds:  Scheduled Meds:  amLODipine  10 mg Oral Daily   apixaban  5 mg Oral BID   atorvastatin  40 mg Oral QHS   feeding supplement  237 mL Oral BID BM   furosemide  40 mg Intravenous BID   [START ON 02/05/2023] furosemide  40 mg Oral Daily   insulin aspart  0-5 Units Subcutaneous QHS   insulin aspart  0-9 Units Subcutaneous TID WC   insulin glargine-yfgn  6 Units Subcutaneous Daily   lidocaine  10 mL Intradermal Once   linagliptin  5 mg Oral Daily   losartan  50 mg Oral Daily   mirtazapine  15 mg Oral QHS   multivitamin with minerals  1 tablet Oral Daily   nutrition supplement (JUVEN)  1 packet Oral BID BM   PARoxetine  20 mg Oral Daily   QUEtiapine  25 mg Oral QHS   spironolactone  25 mg Oral Daily   Continuous Infusions:   PRN Meds:.hydrALAZINE, mouth rinse  Antimicrobials: Anti-infectives (From admission, onward)    None        I have personally reviewed the following labs and images: CBC: Recent Labs  Lab 02/01/23 1625 02/03/23 0255  WBC 5.5 5.5  HGB 10.1* 9.1*  HCT 31.7* 27.9*  MCV 93.5 91.2  PLT 227 206   BMP &GFR  Recent Labs  Lab 02/01/23 1625 02/02/23 0640 02/02/23 0649 02/03/23 0255 02/04/23 0244  NA 136 137  --  137 141  K 3.6 3.3*  --  3.9 4.3  CL 101 100  --  99 102  CO2 25 23  --  29 29  GLUCOSE 106* 190*  --  149* 225*  BUN 14 16  --  13 20  CREATININE 0.87 1.08  --  0.92 1.18  CALCIUM 8.5* 8.4*  --  8.2* 8.3*  MG  --   --  1.5* 1.8  --   PHOS  --   --   --  3.7  --    Estimated Creatinine Clearance: 59.1 mL/min (by C-G formula based on SCr of 1.18 mg/dL). Liver & Pancreas: Recent Labs  Lab 02/02/23 0640 02/03/23 0255  AST 18  --   ALT 12  --   ALKPHOS 119  --   BILITOT 0.6  --   PROT 6.0*  --   ALBUMIN 2.6* 2.5*   No results for input(s): "LIPASE", "AMYLASE" in the last 168 hours. No results for input(s): "AMMONIA" in the last 168 hours. Diabetic: Recent  Labs    02/03/23 0255  HGBA1C 7.8*   Recent Labs  Lab 02/03/23 1148 02/03/23 1619 02/03/23 2123 02/04/23 0618 02/04/23 1202  GLUCAP 174* 166* 209* 175* 237*   Cardiac Enzymes: No results for input(s): "CKTOTAL", "CKMB", "CKMBINDEX", "TROPONINI" in the last 168 hours. No results for input(s): "PROBNP" in the last 8760 hours. Coagulation Profile: No results for input(s): "INR", "PROTIME" in the last 168 hours. Thyroid Function Tests: No results for input(s): "TSH", "T4TOTAL", "FREET4", "T3FREE", "THYROIDAB" in the last 72 hours. Lipid Profile: No results for input(s): "CHOL", "HDL", "LDLCALC", "TRIG", "CHOLHDL", "LDLDIRECT" in the last 72 hours. Anemia Panel: No results for input(s): "VITAMINB12", "FOLATE", "FERRITIN", "TIBC", "IRON", "RETICCTPCT" in the last 72 hours. Urine analysis:    Component Value Date/Time   COLORURINE YELLOW 02/01/2023 2242   APPEARANCEUR HAZY (A) 02/01/2023 2242   LABSPEC 1.017 02/01/2023 2242   PHURINE 5.0 02/01/2023 2242   GLUCOSEU 50 (A) 02/01/2023 2242   GLUCOSEU >=1000 (A) 03/16/2020 0958   HGBUR SMALL (A) 02/01/2023 2242   BILIRUBINUR NEGATIVE 02/01/2023 2242   BILIRUBINUR Negative 12/31/2022 1222   KETONESUR NEGATIVE 02/01/2023 2242   PROTEINUR >=300 (A) 02/01/2023 2242   UROBILINOGEN 1.0 12/31/2022 1222   UROBILINOGEN 0.2 03/16/2020 0958   NITRITE NEGATIVE 02/01/2023 2242   LEUKOCYTESUR NEGATIVE 02/01/2023 2242   Sepsis Labs: Invalid input(s): "PROCALCITONIN", "LACTICIDVEN"  Microbiology: Recent Results (from the past 240 hours)  SARS Coronavirus 2 by RT PCR (hospital order, performed in [facility] hospital lab) *cepheid single result test* Nasopharyngeal Swab     Status: None   Collection Time: 02/02/23  6:40 AM   Specimen: Nasopharyngeal Swab; Nasal Swab  Result Value Ref Range Status   SARS Coronavirus 2 by RT PCR NEGATIVE NEGATIVE Final    Comment: Performed at [facility], [address], [place], [place] [address]   Respiratory (~20 pathogens) panel by PCR     Status: None   Collection Time: 02/02/23  6:40 AM   Specimen: Nasopharyngeal Swab; Respiratory  Result Value Ref Range Status   Adenovirus NOT DETECTED NOT DETECTED Final   Coronavirus 229E NOT DETECTED NOT DETECTED Final    Comment: (NOTE) The Coronavirus on the Respiratory Panel, DOES NOT test for the novel  Coronavirus (2019 nCoV)    Coronavirus HKU1 NOT DETECTED NOT  DETECTED Final   Coronavirus NL63 NOT DETECTED NOT DETECTED Final   Coronavirus OC43 NOT DETECTED NOT DETECTED Final   Metapneumovirus NOT DETECTED NOT DETECTED Final   Rhinovirus / Enterovirus NOT DETECTED NOT DETECTED Final   Influenza A NOT DETECTED NOT DETECTED Final   Influenza B NOT DETECTED NOT DETECTED Final   Parainfluenza Virus 1 NOT DETECTED NOT DETECTED Final   Parainfluenza Virus 2 NOT DETECTED NOT DETECTED Final   Parainfluenza Virus 3 NOT DETECTED NOT DETECTED Final   Parainfluenza Virus 4 NOT DETECTED NOT DETECTED Final   Respiratory Syncytial Virus NOT DETECTED NOT DETECTED Final   Bordetella pertussis NOT DETECTED NOT DETECTED Final   Bordetella Parapertussis NOT DETECTED NOT DETECTED Final   Chlamydophila pneumoniae NOT DETECTED NOT DETECTED Final   Mycoplasma pneumoniae NOT DETECTED NOT DETECTED Final    Comment: Performed at [facility], [address], [place], [place] [address]    Radiology Studies: IR THORACENTESIS ASP PLEURAL SPACE W/IMG GUIDE Result Date: 02/04/2023 INDICATION: 68 year old male with a history of CHF, admitted for shortness of breath, lower extremity swelling, and altered mental status. Request for therapeutic right-sided thoracentesis. EXAM: ULTRASOUND GUIDED THERAPEUTIC THORACENTESIS MEDICATIONS: 8 cc of 1% lidocaine COMPLICATIONS: None immediate. PROCEDURE: An ultrasound guided thoracentesis was thoroughly discussed with the patient and questions answered. The benefits, risks, alternatives and complications were also  discussed. The patient understands and wishes to proceed with the procedure. Written consent was obtained. Ultrasound was performed to localize and mark an adequate pocket of fluid in the right chest. The area was then prepped and draped in the normal sterile fashion. 1% Lidocaine was used for local anesthesia. Under ultrasound guidance a 6 Fr Safe-T-Centesis catheter was introduced. Thoracentesis was performed. The catheter was removed and a dressing applied. FINDINGS: A total of approximately 1.8 L of clear, straw-colored pleural fluid was removed. IMPRESSION: Successful ultrasound guided RIGHT thoracentesis yielding 1.8 L of pleural fluid. Procedure performed by Buzzy Han, PA-C Electronically Signed   By: Roanna Banning M.D.   On: 02/04/2023 12:44   DG Chest 1 View Result Date: 02/04/2023 CLINICAL DATA:  Post thoracentesis on the right. EXAM: CHEST  1 VIEW COMPARISON:  02/03/2023 FINDINGS: Decreasing right pleural effusion following thoracentesis. No pneumothorax. Small to moderate bilateral pleural effusions remain with bibasilar opacities, favor atelectasis. Heart and mediastinal contours within normal limits. Prior CABG. Aortic atherosclerosis. IMPRESSION: Decreasing right effusion following thoracentesis.  No pneumothorax. Small to moderate bilateral effusions with bibasilar atelectasis. Electronically Signed   By: Charlett Nose M.D.   On: 02/04/2023 12:03      Ethelwyn Gilbertson T. Ryer As[name]  If 7PM-7AM, please contact night-coverage [email or web address] 02/04/2023, 1:04 PM

## 2023-02-04 NOTE — Progress Notes (Signed)
SLP Cancellation Note  Patient Details Name: [name] [name] MRN: [id number] DOB: 10-Sep-1955   Cancelled treatment:       Reason Eval/Treat Not Completed: Patient declined testing from SLP, stating that he does not need it. He is not interested in eval acutely or any f/u therapy. Note that supervision and [name] SLP had been recommended last hospital stay, but pt notes that he has not had any f/u services since that time and he does not think he needs this. Encouraged some additional supervision from family upon initial return home given subacute infarcts noted on MRI. SLP to s/o as pt is adamant that he does not want testing.    Mahala Menghini., M.A. CCC-SLP Acute Rehabilitation Services Office [phone]  Secure chat preferred  02/04/2023, 9:46 AM

## 2023-02-04 NOTE — Inpatient Diabetes Management (Addendum)
Inpatient Diabetes Program Recommendations  AACE/ADA: New Consensus Statement on Inpatient Glycemic Control (2015)  Target Ranges:  Prepandial:   less than 140 mg/dL      Peak postprandial:   less than 180 mg/dL (1-2 hours)      Critically ill patients:  140 - 180 mg/dL   Lab Results  Component Value Date   GLUCAP 175 (H) 02/04/2023   HGBA1C 7.8 (H) 02/03/2023    Review of Glycemic Control  Latest Reference Range & Units 02/03/23 06:34 02/03/23 11:48 02/03/23 16:19 02/03/23 21:23 02/04/23 06:18  Glucose-Capillary 70 - 99 mg/dL 601 (H) 093 (H) 235 (H) 209 (H) 175 (H)   Diabetes history: DM 2 Outpatient Diabetes medications:  Januvia 100 mg daily, Glipizide 5 mg q AM Current orders for Inpatient glycemic control:  Novolog 0-9 units tid with meals and HS Tradjenta 5 mg daily  Inpatient Diabetes Program Recommendations:    Note insulin pump stopped during last hospitalization.  Per patient's son, [name], MD has adjusted oral medications for his DM since hospitalization and he does not take insulin at home.  A1C is suprisingly better without insulin.    Addendum 1300- Spoke with patient regarding DM management.  Explained that A1C is better.  He states that he liked being on insulin pump.  I explained that his blood sugars are better and to keep following up with Dr. Lonzo Cloud.  Shared A1C results with patient and explained that blood sugars are fairly well controlled without pump/insulin.  Patient appreciative of visit.   Thanks,  Lorenza Cambridge, RN, BC-ADM Inpatient Diabetes Coordinator Pager [phone]  (8a-5p)

## 2023-02-04 NOTE — Progress Notes (Signed)
PT Cancellation Note  Patient Details Name: [name] [name] MRN: [id number] DOB: Jul 28, 1955   Cancelled Treatment:    Reason Eval/Treat Not Completed: Other (comment) (Pt evaluated by PT on 2/1 and PT stated pt was at baseline, has family support and recommended HHPT f/u.  Will defer another PT evaluation at this time. MD agrees.)   Bevelyn Buckles 02/04/2023, 9:04 AM Mical Kicklighter M,PT Acute Rehab Services [phone]

## 2023-02-04 NOTE — Plan of Care (Signed)
  Problem: Ischemic Stroke/TIA Tissue Perfusion: Goal: Complications of ischemic stroke/TIA will be minimized Outcome: Progressing   Problem: Education: Goal: Knowledge of disease or condition will improve Outcome: Progressing   Problem: Self-Care: Goal: Ability to participate in self-care as condition permits will improve Outcome: Progressing   Problem: Coping: Goal: Ability to adjust to condition or change in health will improve Outcome: Progressing

## 2023-02-04 NOTE — Procedures (Signed)
PROCEDURE SUMMARY:  Successful image-guided therapeutic right-sided thoracentesis. Yielded 1.8 liters of clear, straw-colored pleural fluid. Patient tolerated procedure well. EBL: Zero No immediate complications.  Post procedure CXR shows no pneumothorax.  Please see imaging section of Epic for full dictation.  Kyrra Prada A Elli Groesbeck PA-C 02/04/2023 11:22 AM

## 2023-02-05 DIAGNOSIS — E1159 Type 2 diabetes mellitus with other circulatory complications: Secondary | ICD-10-CM | POA: Diagnosis not present

## 2023-02-05 DIAGNOSIS — R441 Visual hallucinations: Secondary | ICD-10-CM | POA: Diagnosis not present

## 2023-02-05 DIAGNOSIS — I639 Cerebral infarction, unspecified: Secondary | ICD-10-CM | POA: Diagnosis not present

## 2023-02-05 DIAGNOSIS — I5033 Acute on chronic diastolic (congestive) heart failure: Secondary | ICD-10-CM | POA: Diagnosis not present

## 2023-02-05 LAB — RENAL FUNCTION PANEL
Albumin: 2.3 g/dL — ABNORMAL LOW (ref 3.5–5.0)
Anion gap: 9 (ref 5–15)
BUN: 36 mg/dL — ABNORMAL HIGH (ref 8–23)
CO2: 31 mmol/L (ref 22–32)
Calcium: 8.3 mg/dL — ABNORMAL LOW (ref 8.9–10.3)
Chloride: 94 mmol/L — ABNORMAL LOW (ref 98–111)
Creatinine, Ser: 1.26 mg/dL — ABNORMAL HIGH (ref 0.61–1.24)
GFR, Estimated: >60 mL/min (ref >60)
Glucose, Bld: 139 mg/dL — ABNORMAL HIGH (ref 70–99)
Phosphorus: 4 mg/dL (ref 2.5–4.6)
Potassium: 5.3 mmol/L — ABNORMAL HIGH (ref 3.5–5.1)
Sodium: 134 mmol/L — ABNORMAL LOW (ref 135–145)

## 2023-02-05 LAB — CBC
HCT: 28.9 % — ABNORMAL LOW (ref 39.0–52.0)
Hemoglobin: 9.5 g/dL — ABNORMAL LOW (ref 13.0–17.0)
MCH: 30.1 pg (ref 26.0–34.0)
MCHC: 32.9 g/dL (ref 30.0–36.0)
MCV: 91.5 fL (ref 80.0–100.0)
Platelets: 206 10*3/uL (ref 150–400)
RBC: 3.16 MIL/uL — ABNORMAL LOW (ref 4.22–5.81)
RDW: 15.7 % — ABNORMAL HIGH (ref 11.5–15.5)
WBC: 7 10*3/uL (ref 4.0–10.5)
nRBC: 0 % (ref 0.0–0.2)

## 2023-02-05 LAB — GLUCOSE, CAPILLARY
Glucose-Capillary: 127 mg/dL — ABNORMAL HIGH (ref 70–99)
Glucose-Capillary: 111 mg/dL — ABNORMAL HIGH (ref 70–99)
Glucose-Capillary: 129 mg/dL — ABNORMAL HIGH (ref 70–99)
Glucose-Capillary: 184 mg/dL — ABNORMAL HIGH (ref 70–99)

## 2023-02-05 LAB — ACID FAST SMEAR (AFB, MYCOBACTERIA): Acid Fast Smear: NEGATIVE

## 2023-02-05 LAB — MAGNESIUM: Magnesium: 1.7 mg/dL (ref 1.7–2.4)

## 2023-02-05 MED ORDER — SODIUM ZIRCONIUM CYCLOSILICATE 10 G PO PACK
10.0000 g | PACK | Freq: Once | ORAL | Status: AC
  Administered 2023-02-05: 10 g via ORAL
  Filled 2023-02-05: qty 1

## 2023-02-05 NOTE — Inpatient Diabetes Management (Signed)
Inpatient Diabetes Program Recommendations  AACE/ADA: New Consensus Statement on Inpatient Glycemic Control (2015)  Target Ranges:  Prepandial:   less than 140 mg/dL      Peak postprandial:   less than 180 mg/dL (1-2 hours)      Critically ill patients:  140 - 180 mg/dL   Lab Results  Component Value Date   GLUCAP 127 (H) 02/05/2023   HGBA1C 7.8 (H) 02/03/2023    Review of Glycemic Control  Latest Reference Range & Units 02/04/23 06:18 02/04/23 12:02 02/04/23 15:48 02/04/23 21:30 02/05/23 05:55  Glucose-Capillary 70 - 99 mg/dL 161 (H) 096 (H) 045 (H) 213 (H) 127 (H)   Diabetes history: DM 2 Outpatient Diabetes medications:  Januvia 100 mg daily, Glipizide 5 mg q AM Current orders for Inpatient glycemic control: Semglee 6 untis Daily  Novolog 0-9 units tid with meals and HS Tradjenta 5 mg daily  Ensure Enlive bid between meals (40 grams of carbohydrates)  Inpatient Diabetes Program Recommendations:    Note: postprandial glucose trends increase.  -   Consider adding Novolog 3 units tid meal coverage if eating >50% of meals  At time of d/c: -   Continue outpt oral regimen based on much improved A1c, follow up with Endocrinology, Dr. Lonzo Cloud.  Thanks,  Christena Deem RN, MSN, BC-ADM Inpatient Diabetes Coordinator Team Pager [phone] (8a-5p)

## 2023-02-05 NOTE — Consult Note (Signed)
 [facility] Liaison Consult Note   02/05/2023  Masin Shatto 1955-08-01 [id number]  [name] Patient: Previously active with [facility] LCSW prior to admission  Primary Care Provider:  [name], Mardy HERO, PA-C,  at [facility], this provider is listed to provide the community transition of care follow up and [facility] TOC calls  Insurance: [employer]  Patient is currently active with [facility] for care coordination services.  Patient has been engaged by a Clinical Cytogeneticist.  The community based plan of care has focused on disease management and community resource support.  Met with patient at the bedside, patient had some difficulty understanding the conversation at first. However, was able to give the appropriate HIPAA. Patient did not remember being active with [facility] Social Worker and needs for SDOH issues. No family at bedside.  Plan: Continue to follow for any additional community care coordination needs for post hospital/community needs.  Will continue to follow patient for progress and post hospital needs  Of note, [facility] services does not replace or interfere with any services that are needed or arranged by inpatient [facility] care management team.   Richerd Fish, RN, BSN, CCM [place]  [facility], [facility] Liaison Direct Dial : [phone] or secure chat Email: [email or web address]

## 2023-02-05 NOTE — Progress Notes (Addendum)
 PROGRESS NOTE  [name] [name] FMW:[id number] DOB: [birth year]   PCP: [name] HERO, PA-C  Patient is from: Home.  Lives alone.  Uses rollator at baseline.  DOA: 02/01/2023 LOS: 2  Chief complaints Chief Complaint  Patient presents with   Altered Mental Status     Brief Narrative / Interim history: 68 year old M with PMH of diastolic CHF, DM-2, CAD/CABG, CVA, HTN, PVD, paroxysmal A-fib with slow ventricular response on Eliquis , BPH, anxiety, depression, HLD and psoriasis sent to ED by PCP due to shortness of breath, cough, orthopnea, edema, weight gain and visual hallucination, and admitted with working diagnosis of acute diastolic CHF, hypertensive urgency and subacute stroke.  In ED, SBP as high as 213.  DBP as high as 105.  HR in 50s.  CBC, BMP, UA and UDS without significant finding other than proteinuria on UA.  BNP 458.  Troponin 29> 27.  CXR showed moderate bilateral pleural effusion, right> left.  CT head without acute finding.  MRI brain showed small foci of early subacute ischemia within the right frontal white matter and chronic microhemorrhage in the left cerebellum and both temporal lobes.  Neurology consulted.  Patient was started on Lasix  and antihypertensive meds and admitted.  Small foci of early subacute ischemia within the right frontal white matter.  The next day, evaluated by neurology.  CT angio head and neck without significant finding.  Eliquis  started.    Underwent left thoracocentesis with removal of 1.1 L transudative fluid on 2/2.  Right thoracocentesis with removal of 1.8 L on 2/3.  Diuretics held due to hypotension, AKI and hyperkalemia   Subjective: Seen and examined earlier this morning.  No major events overnight of this morning.  No complaints.  Soft blood pressure this morning.  Also some AKI and hyperkalemia.   Objective: Vitals:   02/04/23 2000 02/05/23 0033 02/05/23 0430 02/05/23 0740  BP: 135/71 117/64 (!) 102/57 107/62  Pulse: 61 65 (!) 54  (!) 44  Resp: 18 18 18 20   Temp: 97.8 F (36.6 C) 99.5 F (37.5 C) 99.5 F (37.5 C) 99 F (37.2 C)  TempSrc: Oral Oral Oral Oral  SpO2: 96% 94% 98% 99%  Weight:   68 kg   Height:        Examination:  GENERAL: No apparent distress.  Nontoxic.  Appears frail. HEENT: MMM.  Vision and hearing grossly intact.  NECK: Supple.  No apparent JVD.  RESP:  No IWOB.  Fair aeration bilaterally. CVS: Irregular.  Bradycardic to 50s.  Heart sounds normal.  ABD/GI/GU: BS+. Abd soft, NTND.  MSK/EXT:   No apparent deformity. Moves extremities.  Significant muscle mass and subcu fat loss. SKIN: no apparent skin lesion or wound NEURO: Awake and alert. Oriented appropriately.  No apparent focal neuro deficit. PSYCH: Calm. Normal affect.   Procedures:  None  Microbiology summarized: COVID-19 PCR nonreactive A 20 pathogen RVP nonreactive  Assessment and plan: Acute on chronic HFpEF: Limited TTE with LVEF of 60 to 65%, and RVSP of 46 mmHg.  Presents with dyspnea, orthopnea, BLE edema and weight gain.  BNP 457.  CXR showed moderate bilateral pleural effusion, right> left.  Started on IV Lasix .  I&O incomplete.  Net -2.5 L.  Symptoms resolved.  Cr stable. -S/p left thoracocentesis with removal of 1.1 L transudative fluid on 2/2. -S/p right thoracocentesis with removal of 1.8 L on 2/3. -Hold Lasix , Aldactone  and losartan  due to soft BP, rising creatinine and hyperkalemia. -Strict intake and output, daily weight, renal  functions and electrolytes   Subacute CVA: Hallucinations seems to have resolved.  No focal neurosymptoms or deficits on exam.  MRI brain showed small foci of early subacute ischemia within the right frontal white matter and chronic microhemorrhage in the left cerebellum and both temporal lobes.  CT angio without LVO major stenosis.  A1c 7.8%.  His LDL was 52 in 11/2022. -Neurology signed off and recommended continuing Eliquis .    Hypertensive urgency: SBP as high as 213 and DBP/105 in  ED. Now with soft blood pressure. -Hold Lasix , Aldactone  and losartan . -Continue amlodipine  -P.o. hydralazine  as needed -Avoid nodal blocking agent given bradycardia  Bilateral pleural effusion: X-ray showed moderate bilateral pleural effusion, right> left. -S/p left thoracocentesis with removal of 1.1 L transudative fluid on 2/2 -S/p right thoracocentesis with removal of 1.8 L clear straw-colored pleural fluid on 2/3  AKI: Mild.  Baseline Cr 0.9. Recent Labs    11/27/22 0301 11/28/22 0243 11/29/22 0231 11/30/22 0250 12/28/22 1346 02/01/23 1625 02/02/23 0640 02/03/23 0255 02/04/23 0244 02/05/23 0254  BUN 61* 53* 49* 44* 34* 14 16 13 20  36*  CREATININE 2.04* 1.55* 1.37* 1.50* 0.97 0.87 1.08 0.92 1.18 1.26*  -Hold Lasix , Aldactone  and losartan  -Recheck in the morning   Paroxysmal A-fib with bradycardia: HR in 50s-60s. -Avoid nodal blocking agents -Continue Eliquis   Elevated troponin/history of CAD/CABG: No chest pain.  Mildly elevated troponin likely demand ischemia -Continue home statin and cardiac meds.   Chronic normocytic anemia: Stable -Continue monitoring   Uncontrolled DM-2 with hyperglycemia/proteinuria: A1c 9.2% in 11/2022.  On glipizide  and Januvia  at home. Recent Labs  Lab 02/04/23 0618 02/04/23 1202 02/04/23 1548 02/04/23 2130 02/05/23 0555  GLUCAP 175* 237* 258* 213* 127*  -Continue current insulin  regimen-SSI-sensitive scale -Tradjenta  and steroid from Januvia  -Continue Semglee  6 units daily -Hold glipizide  -Continue home losartan . -Appreciate help by diabetic coordinator.   Mood disorder/visual hallucination: Hallucinations seems to have resolved.  UDS negative. -Continue mirtazapine , paroxetine , and quetiapine .  Physical deconditioning: Lives alone.  Uses rollator at baseline -PT OT recommended HH PT.  Hypokalemia/hypomagnesemia -Monitor and replenish as appropriate  Hyperkalemia: Mild -Lokelma  10 g x 1 -Hold Aldactone  and  losartan   Hyponatremia: Mild.  Likely hypovolemic from diuretics.  Also on Aldactone  and losartan . -Hold diuretics as above -Recheck in the morning  Hyperlipidemia -Continue Lipitor.  Increased nutrient needs Body mass index is 17.78 kg/m. Nutrition Problem: Increased nutrient needs Etiology: acute illness Signs/Symptoms: estimated needs Interventions: Liberalize Diet, MVI, Juven  Pressure injuries of the Left and Right Coccyx stage 3,  Pressure Injury 02/02/23 Coccyx Right Stage 3 -  Full thickness tissue loss. Subcutaneous fat may be visible but bone, tendon or muscle are NOT exposed. (Active)  02/02/23 2300  Location: Coccyx  Location Orientation: Right  Staging: Stage 3 -  Full thickness tissue loss. Subcutaneous fat may be visible but bone, tendon or muscle are NOT exposed.  Wound Description (Comments):   Present on Admission: Yes     Pressure Injury 02/02/23 Coccyx Left Stage 3 -  Full thickness tissue loss. Subcutaneous fat may be visible but bone, tendon or muscle are NOT exposed. (Active)  02/02/23 2300  Location: Coccyx  Location Orientation: Left  Staging: Stage 3 -  Full thickness tissue loss. Subcutaneous fat may be visible but bone, tendon or muscle are NOT exposed.  Wound Description (Comments):   Present on Admission: Yes       DVT prophylaxis:   apixaban  (ELIQUIS ) tablet 5 mg  Code Status: Full  code Family Communication: None at bedside Level of care: Telemetry Medical Status is: Inpatient The patient will remain inpatient because: Acute CHF, hypertensive urgency, stroke and AKI   Final disposition: Home with home health in the next 24 to 48 hours. Consultants:  Neurology  55 minutes with more than 50% spent in reviewing records, counseling patient/family and coordinating care.   Sch Meds:  Scheduled Meds:  amLODipine   10 mg Oral Daily   apixaban   5 mg Oral BID   atorvastatin   40 mg Oral QHS   feeding supplement  237 mL Oral BID BM    insulin  aspart  0-5 Units Subcutaneous QHS   insulin  aspart  0-9 Units Subcutaneous TID WC   insulin  glargine-yfgn  6 Units Subcutaneous Daily   lidocaine   10 mL Intradermal Once   linagliptin   5 mg Oral Daily   mirtazapine   15 mg Oral QHS   multivitamin with minerals  1 tablet Oral Daily   nutrition supplement (JUVEN)  1 packet Oral BID BM   PARoxetine   20 mg Oral Daily   QUEtiapine   25 mg Oral QHS   Continuous Infusions:   PRN Meds:.acetaminophen , hydrALAZINE , mouth rinse  Antimicrobials: Anti-infectives (From admission, onward)    None        I have personally reviewed the following labs and images: CBC: Recent Labs  Lab 02/01/23 1625 02/03/23 0255 02/05/23 0254  WBC 5.5 5.5 7.0  HGB 10.1* 9.1* 9.5*  HCT 31.7* 27.9* 28.9*  MCV 93.5 91.2 91.5  PLT 227 206 206   BMP &GFR Recent Labs  Lab 02/01/23 1625 02/02/23 0640 02/02/23 0649 02/03/23 0255 02/04/23 0244 02/05/23 0254  NA 136 137  --  137 141 134*  K 3.6 3.3*  --  3.9 4.3 5.3*  CL 101 100  --  99 102 94*  CO2 25 23  --  29 29 31   GLUCOSE 106* 190*  --  149* 225* 139*  BUN 14 16  --  13 20 36*  CREATININE 0.87 1.08  --  0.92 1.18 1.26*  CALCIUM  8.5* 8.4*  --  8.2* 8.3* 8.3*  MG  --   --  1.5* 1.8  --  1.7  PHOS  --   --   --  3.7  --  4.0   Estimated Creatinine Clearance: 52.5 mL/min (A) (by C-G formula based on SCr of 1.26 mg/dL (H)). Liver & Pancreas: Recent Labs  Lab 02/02/23 0640 02/03/23 0255 02/05/23 0254  AST 18  --   --   ALT 12  --   --   ALKPHOS 119  --   --   BILITOT 0.6  --   --   PROT 6.0*  --   --   ALBUMIN 2.6* 2.5* 2.3*   No results for input(s): LIPASE, AMYLASE in the last 168 hours. No results for input(s): AMMONIA in the last 168 hours. Diabetic: Recent Labs    02/03/23 0255  HGBA1C 7.8*   Recent Labs  Lab 02/04/23 0618 02/04/23 1202 02/04/23 1548 02/04/23 2130 02/05/23 0555  GLUCAP 175* 237* 258* 213* 127*   Cardiac Enzymes: No results for input(s):  CKTOTAL, CKMB, CKMBINDEX, TROPONINI in the last 168 hours. No results for input(s): PROBNP in the last 8760 hours. Coagulation Profile: No results for input(s): INR, PROTIME in the last 168 hours. Thyroid  Function Tests: No results for input(s): TSH, T4TOTAL, FREET4, T3FREE, THYROIDAB in the last 72 hours. Lipid Profile: No results for input(s): CHOL, HDL, LDLCALC, TRIG,  CHOLHDL, LDLDIRECT in the last 72 hours. Anemia Panel: No results for input(s): VITAMINB12, FOLATE, FERRITIN, TIBC, IRON , RETICCTPCT in the last 72 hours. Urine analysis:    Component Value Date/Time   COLORURINE YELLOW 02/01/2023 2242   APPEARANCEUR HAZY (A) 02/01/2023 2242   LABSPEC 1.017 02/01/2023 2242   PHURINE 5.0 02/01/2023 2242   GLUCOSEU 50 (A) 02/01/2023 2242   GLUCOSEU >=1000 (A) 03/16/2020 0958   HGBUR SMALL (A) 02/01/2023 2242   BILIRUBINUR NEGATIVE 02/01/2023 2242   BILIRUBINUR Negative 12/31/2022 1222   KETONESUR NEGATIVE 02/01/2023 2242   PROTEINUR >=300 (A) 02/01/2023 2242   UROBILINOGEN 1.0 12/31/2022 1222   UROBILINOGEN 0.2 03/16/2020 0958   NITRITE NEGATIVE 02/01/2023 2242   LEUKOCYTESUR NEGATIVE 02/01/2023 2242   Sepsis Labs: Invalid input(s): PROCALCITONIN, LACTICIDVEN  Microbiology: Recent Results (from the past 240 hours)  SARS Coronavirus 2 by RT PCR (hospital order, performed in [facility] hospital lab) *cepheid single result test* Nasopharyngeal Swab     Status: None   Collection Time: 02/02/23  6:40 AM   Specimen: Nasopharyngeal Swab; Nasal Swab  Result Value Ref Range Status   SARS Coronavirus 2 by RT PCR NEGATIVE NEGATIVE Final    Comment: Performed at [facility], [address] [place], [place] [address]  Respiratory (~20 pathogens) panel by PCR     Status: None   Collection Time: 02/02/23  6:40 AM   Specimen: Nasopharyngeal Swab; Respiratory  Result Value Ref Range Status   Adenovirus NOT DETECTED NOT DETECTED  Final   Coronavirus 229E NOT DETECTED NOT DETECTED Final    Comment: (NOTE) The Coronavirus on the Respiratory Panel, DOES NOT test for the novel  Coronavirus (2019 nCoV)    Coronavirus HKU1 NOT DETECTED NOT DETECTED Final   Coronavirus NL63 NOT DETECTED NOT DETECTED Final   Coronavirus OC43 NOT DETECTED NOT DETECTED Final   Metapneumovirus NOT DETECTED NOT DETECTED Final   Rhinovirus / Enterovirus NOT DETECTED NOT DETECTED Final   Influenza A NOT DETECTED NOT DETECTED Final   Influenza B NOT DETECTED NOT DETECTED Final   Parainfluenza Virus 1 NOT DETECTED NOT DETECTED Final   Parainfluenza Virus 2 NOT DETECTED NOT DETECTED Final   Parainfluenza Virus 3 NOT DETECTED NOT DETECTED Final   Parainfluenza Virus 4 NOT DETECTED NOT DETECTED Final   Respiratory Syncytial Virus NOT DETECTED NOT DETECTED Final   Bordetella pertussis NOT DETECTED NOT DETECTED Final   Bordetella Parapertussis NOT DETECTED NOT DETECTED Final   Chlamydophila pneumoniae NOT DETECTED NOT DETECTED Final   Mycoplasma pneumoniae NOT DETECTED NOT DETECTED Final    Comment: Performed at [facility], [address] [place], [place] [address]    Radiology Studies: No results found.     Tasmin Exantus T. Marcina Kinnison [name]  If 7PM-7AM, please contact night-coverage [email or web address] 02/05/2023, 11:29 AM

## 2023-02-06 DIAGNOSIS — I639 Cerebral infarction, unspecified: Secondary | ICD-10-CM | POA: Diagnosis not present

## 2023-02-06 DIAGNOSIS — I5033 Acute on chronic diastolic (congestive) heart failure: Secondary | ICD-10-CM | POA: Diagnosis not present

## 2023-02-06 DIAGNOSIS — E1159 Type 2 diabetes mellitus with other circulatory complications: Secondary | ICD-10-CM | POA: Diagnosis not present

## 2023-02-06 DIAGNOSIS — R441 Visual hallucinations: Secondary | ICD-10-CM | POA: Diagnosis not present

## 2023-02-06 LAB — GLUCOSE, CAPILLARY
Glucose-Capillary: 122 mg/dL — ABNORMAL HIGH (ref 70–99)
Glucose-Capillary: 124 mg/dL — ABNORMAL HIGH (ref 70–99)
Glucose-Capillary: 107 mg/dL — ABNORMAL HIGH (ref 70–99)
Glucose-Capillary: 209 mg/dL — ABNORMAL HIGH (ref 70–99)

## 2023-02-06 LAB — RENAL FUNCTION PANEL
Albumin: 2.3 g/dL — ABNORMAL LOW (ref 3.5–5.0)
Anion gap: 8 (ref 5–15)
BUN: 49 mg/dL — ABNORMAL HIGH (ref 8–23)
CO2: 32 mmol/L (ref 22–32)
Calcium: 8.5 mg/dL — ABNORMAL LOW (ref 8.9–10.3)
Chloride: 93 mmol/L — ABNORMAL LOW (ref 98–111)
Creatinine, Ser: 1.5 mg/dL — ABNORMAL HIGH (ref 0.61–1.24)
GFR, Estimated: 50 mL/min — ABNORMAL LOW (ref >60)
Glucose, Bld: 152 mg/dL — ABNORMAL HIGH (ref 70–99)
Phosphorus: 4.5 mg/dL (ref 2.5–4.6)
Potassium: 4.8 mmol/L (ref 3.5–5.1)
Sodium: 133 mmol/L — ABNORMAL LOW (ref 135–145)

## 2023-02-06 LAB — CBC
HCT: 29.1 % — ABNORMAL LOW (ref 39.0–52.0)
Hemoglobin: 9.4 g/dL — ABNORMAL LOW (ref 13.0–17.0)
MCH: 29.5 pg (ref 26.0–34.0)
MCHC: 32.3 g/dL (ref 30.0–36.0)
MCV: 91.2 fL (ref 80.0–100.0)
Platelets: 218 10*3/uL (ref 150–400)
RBC: 3.19 MIL/uL — ABNORMAL LOW (ref 4.22–5.81)
RDW: 15.3 % (ref 11.5–15.5)
WBC: 6.7 10*3/uL (ref 4.0–10.5)
nRBC: 0 % (ref 0.0–0.2)

## 2023-02-06 LAB — BRAIN NATRIURETIC PEPTIDE: B Natriuretic Peptide: 97.3 pg/mL (ref 0.0–100.0)

## 2023-02-06 LAB — MAGNESIUM: Magnesium: 1.8 mg/dL (ref 1.7–2.4)

## 2023-02-06 MED ORDER — GERHARDT'S BUTT CREAM
TOPICAL_CREAM | Freq: Two times a day (BID) | CUTANEOUS | Status: DC
  Filled 2023-02-06 (×2): qty 60

## 2023-02-06 NOTE — Progress Notes (Signed)
 PROGRESS NOTE  [name] [name] FMW:[id number] DOB: 1955/09/19   PCP: [name] HERO, PA-C  Patient is from: Home.  Lives alone.  Uses rollator at baseline.  DOA: 02/01/2023 LOS: 3  Chief complaints Chief Complaint  Patient presents with   Altered Mental Status     Brief Narrative / Interim history: 68 year old M with PMH of diastolic CHF, DM-2, CAD/CABG, CVA, HTN, PVD, paroxysmal A-fib with slow ventricular response on Eliquis , BPH, anxiety, depression, HLD and psoriasis sent to ED by PCP due to shortness of breath, cough, orthopnea, edema, weight gain and visual hallucination, and admitted with working diagnosis of acute diastolic CHF, hypertensive urgency and subacute stroke.  In ED, SBP as high as 213.  DBP as high as 105.  HR in 50s.  CBC, BMP, UA and UDS without significant finding other than proteinuria on UA.  BNP 458.  Troponin 29> 27.  CXR showed moderate bilateral pleural effusion, right> left.  CT head without acute finding.  MRI brain showed small foci of early subacute ischemia within the right frontal white matter and chronic microhemorrhage in the left cerebellum and both temporal lobes.  Neurology consulted.  Patient was started on Lasix  and antihypertensive meds and admitted.  Small foci of early subacute ischemia within the right frontal white matter.  The next day, evaluated by neurology.  CT angio head and neck without significant finding.  Eliquis  started.    Underwent left thoracocentesis with removal of 1.1 L transudative fluid on 2/2.  Right thoracocentesis with removal of 1.8 L on 2/3.  Diuretics held due to hypotension, AKI and hyperkalemia.  Creatinine continued to rise despite holding diuretics.   Subjective: Seen and examined earlier this morning.  No major events overnight of this morning.  No complaints.  Cr trended to 1.5 this morning despite holding diuretics.  Objective: Vitals:   02/06/23 0031 02/06/23 0400 02/06/23 0746 02/06/23 0812  BP:  127/67 122/74 136/78 128/73  Pulse: 60 62  (!) 55  Resp: 18 18  18   Temp: 98.8 F (37.1 C) 98.8 F (37.1 C) 97.7 F (36.5 C) (!) 97.5 F (36.4 C)  TempSrc: Oral Oral Oral Oral  SpO2: 93% 95%  94%  Weight:  67.6 kg    Height:        Examination:  GENERAL: No apparent distress.  Nontoxic.  Appears frail. HEENT: MMM.  Vision and hearing grossly intact.  NECK: Supple.  No apparent JVD.  RESP:  No IWOB.  Fair aeration bilaterally. CVS: Irregular.  Bradycardic to 50s.  Heart sounds normal.  ABD/GI/GU: BS+. Abd soft, NTND.  MSK/EXT:   No apparent deformity. Moves extremities.  Significant muscle mass and subcu fat loss. SKIN: no apparent skin lesion or wound NEURO: Awake and alert. Oriented appropriately.  No apparent focal neuro deficit. PSYCH: Calm. Normal affect.   Procedures:  None  Microbiology summarized: COVID-19 PCR nonreactive A 20 pathogen RVP nonreactive  Assessment and plan: Acute on chronic HFpEF: Limited TTE with LVEF of 60 to 65%, and RVSP of 46 mmHg.  Presents with dyspnea, orthopnea, BLE edema and weight gain.  BNP 457>> 97..  CXR showed moderate bilateral pleural effusion, right> left.  Started on IV Lasix .  I&O incomplete.  Net -3 L.  Symptoms resolved.  Cr trended up.  Diuretics discontinued. -S/p left thoracocentesis with removal of 1.1 L transudative fluid on 2/2. -S/p right thoracocentesis with removal of 1.8 L on 2/3. -Continue holding Lasix , Aldactone  and losartan  due to AKI. -Strict intake  and output, daily weight, renal functions and electrolytes   Subacute CVA: Hallucinations seems to have resolved.  No focal neurosymptoms or deficits on exam.  MRI brain showed small foci of early subacute ischemia within the right frontal white matter and chronic microhemorrhage in the left cerebellum and both temporal lobes.  CT angio without LVO major stenosis.  A1c 7.8%.  His LDL was 52 in 11/2022. -Neurology signed off and recommended continuing  Eliquis .  Hypertensive urgency: SBP as high as 213 and DBP/105 in ED. Now with soft blood pressure. -Hold Lasix , Aldactone  and losartan . -Continue amlodipine  -P.o. hydralazine  as needed -Avoid nodal blocking agent given bradycardia  Bilateral pleural effusion: X-ray showed moderate bilateral pleural effusion, right> left. -S/p left thoracocentesis with removal of 1.1 L transudative fluid on 2/2 -S/p right thoracocentesis with removal of 1.8 L clear straw-colored pleural fluid on 2/3  AKI: Baseline Cr ~0.9.  AKI likely due to diuretics and ARB. Recent Labs    11/28/22 0243 11/29/22 0231 11/30/22 0250 12/28/22 1346 02/01/23 1625 02/02/23 0640 02/03/23 0255 02/04/23 0244 02/05/23 0254 02/06/23 0255  BUN 53* 49* 44* 34* 14 16 13 20  36* 49*  CREATININE 1.55* 1.37* 1.50* 0.97 0.87 1.08 0.92 1.18 1.26* 1.50*  -Continue holding Lasix , Aldactone  and losartan  -Recheck in the morning   Paroxysmal A-fib with bradycardia: HR in 50s-60s. -Avoid nodal blocking agents -Continue Eliquis   Elevated troponin/history of CAD/CABG: No chest pain.  Mildly elevated troponin likely demand ischemia -Continue home statin and cardiac meds.   Chronic normocytic anemia: Stable -Continue monitoring   Uncontrolled DM-2 with hyperglycemia/proteinuria: A1c 9.2% in 11/2022.  On glipizide  and Januvia  at home. Recent Labs  Lab 02/05/23 1142 02/05/23 1647 02/05/23 2157 02/06/23 0643 02/06/23 1130  GLUCAP 111* 129* 184* 122* 124*  -Continue current insulin  regimen-SSI-sensitive scale -Tradjenta  and steroid from Januvia  -Continue Semglee  6 units daily -Continue holding glipizide . -Appreciate help by diabetic coordinator.   Mood disorder/visual hallucination: Hallucinations seems to have resolved.  UDS negative. -Continue mirtazapine , paroxetine , and quetiapine .  Physical deconditioning: Lives alone.  Uses rollator at baseline -PT OT recommended HH PT.  Hypokalemia/hypomagnesemia -Monitor and  replenish as appropriate  Hyperkalemia: Mild.  Noted after Lokelma  -Continue holding Aldactone  and losartan .  Hyponatremia: Mild.  Likely hypovolemic from diuretics, Aldactone  and losartan . -Hold culprit meds. -Recheck in the morning  Hyperlipidemia -Continue Lipitor.  Increased nutrient needs Body mass index is 17.67 kg/m. Nutrition Problem: Increased nutrient needs Etiology: acute illness Signs/Symptoms: estimated needs Interventions: Liberalize Diet, MVI, Juven  Pressure injuries of the Left and Right Coccyx stage 3,  Pressure Injury 02/02/23 Coccyx Right Stage 3 -  Full thickness tissue loss. Subcutaneous fat may be visible but bone, tendon or muscle are NOT exposed. (Active)  02/02/23 2300  Location: Coccyx  Location Orientation: Right  Staging: Stage 3 -  Full thickness tissue loss. Subcutaneous fat may be visible but bone, tendon or muscle are NOT exposed.  Wound Description (Comments):   Present on Admission: Yes  Dressing Type Foam - Lift dressing to assess site every shift 02/02/23 2300     Pressure Injury 02/02/23 Coccyx Left Stage 3 -  Full thickness tissue loss. Subcutaneous fat may be visible but bone, tendon or muscle are NOT exposed. (Active)  02/02/23 2300  Location: Coccyx  Location Orientation: Left  Staging: Stage 3 -  Full thickness tissue loss. Subcutaneous fat may be visible but bone, tendon or muscle are NOT exposed.  Wound Description (Comments):   Present on Admission: Yes  Dressing Type Foam - Lift dressing to assess site every shift 02/02/23 2300               DVT prophylaxis:   apixaban  (ELIQUIS ) tablet 5 mg  Code Status: Full code Family Communication: None at bedside Level of care: Telemetry Medical Status is: Inpatient The patient will remain inpatient because: Acute CHF, hypertensive urgency, stroke and AKI   Final disposition: Home with home health once AKI improved. Consultants:  Neurology-signed off.  55 minutes with  more than 50% spent in reviewing records, counseling patient/family and coordinating care.   Sch Meds:  Scheduled Meds:  amLODipine   10 mg Oral Daily   apixaban   5 mg Oral BID   atorvastatin   40 mg Oral QHS   feeding supplement  237 mL Oral BID BM   Gerhardt's butt cream   Topical BID   insulin  aspart  0-5 Units Subcutaneous QHS   insulin  aspart  0-9 Units Subcutaneous TID WC   insulin  glargine-yfgn  6 Units Subcutaneous Daily   lidocaine   10 mL Intradermal Once   linagliptin   5 mg Oral Daily   mirtazapine   15 mg Oral QHS   multivitamin with minerals  1 tablet Oral Daily   nutrition supplement (JUVEN)  1 packet Oral BID BM   PARoxetine   20 mg Oral Daily   QUEtiapine   25 mg Oral QHS   Continuous Infusions:   PRN Meds:.acetaminophen , hydrALAZINE , mouth rinse  Antimicrobials: Anti-infectives (From admission, onward)    None        I have personally reviewed the following labs and images: CBC: Recent Labs  Lab 02/01/23 1625 02/03/23 0255 02/05/23 0254 02/06/23 0255  WBC 5.5 5.5 7.0 6.7  HGB 10.1* 9.1* 9.5* 9.4*  HCT 31.7* 27.9* 28.9* 29.1*  MCV 93.5 91.2 91.5 91.2  PLT 227 206 206 218   BMP &GFR Recent Labs  Lab 02/02/23 0640 02/02/23 0649 02/03/23 0255 02/04/23 0244 02/05/23 0254 02/06/23 0255  NA 137  --  137 141 134* 133*  K 3.3*  --  3.9 4.3 5.3* 4.8  CL 100  --  99 102 94* 93*  CO2 23  --  29 29 31  32  GLUCOSE 190*  --  149* 225* 139* 152*  BUN 16  --  13 20 36* 49*  CREATININE 1.08  --  0.92 1.18 1.26* 1.50*  CALCIUM  8.4*  --  8.2* 8.3* 8.3* 8.5*  MG  --  1.5* 1.8  --  1.7 1.8  PHOS  --   --  3.7  --  4.0 4.5   Estimated Creatinine Clearance: 43.8 mL/min (A) (by C-G formula based on SCr of 1.5 mg/dL (H)). Liver & Pancreas: Recent Labs  Lab 02/02/23 0640 02/03/23 0255 02/05/23 0254 02/06/23 0255  AST 18  --   --   --   ALT 12  --   --   --   ALKPHOS 119  --   --   --   BILITOT 0.6  --   --   --   PROT 6.0*  --   --   --   ALBUMIN 2.6*  2.5* 2.3* 2.3*   No results for input(s): LIPASE, AMYLASE in the last 168 hours. No results for input(s): AMMONIA in the last 168 hours. Diabetic: No results for input(s): HGBA1C in the last 72 hours.  Recent Labs  Lab 02/05/23 1142 02/05/23 1647 02/05/23 2157 02/06/23 0643 02/06/23 1130  GLUCAP 111* 129* 184* 122* 124*  Cardiac Enzymes: No results for input(s): CKTOTAL, CKMB, CKMBINDEX, TROPONINI in the last 168 hours. No results for input(s): PROBNP in the last 8760 hours. Coagulation Profile: No results for input(s): INR, PROTIME in the last 168 hours. Thyroid  Function Tests: No results for input(s): TSH, T4TOTAL, FREET4, T3FREE, THYROIDAB in the last 72 hours. Lipid Profile: No results for input(s): CHOL, HDL, LDLCALC, TRIG, CHOLHDL, LDLDIRECT in the last 72 hours. Anemia Panel: No results for input(s): VITAMINB12, FOLATE, FERRITIN, TIBC, IRON , RETICCTPCT in the last 72 hours. Urine analysis:    Component Value Date/Time   COLORURINE YELLOW 02/01/2023 2242   APPEARANCEUR HAZY (A) 02/01/2023 2242   LABSPEC 1.017 02/01/2023 2242   PHURINE 5.0 02/01/2023 2242   GLUCOSEU 50 (A) 02/01/2023 2242   GLUCOSEU >=1000 (A) 03/16/2020 0958   HGBUR SMALL (A) 02/01/2023 2242   BILIRUBINUR NEGATIVE 02/01/2023 2242   BILIRUBINUR Negative 12/31/2022 1222   KETONESUR NEGATIVE 02/01/2023 2242   PROTEINUR >=300 (A) 02/01/2023 2242   UROBILINOGEN 1.0 12/31/2022 1222   UROBILINOGEN 0.2 03/16/2020 0958   NITRITE NEGATIVE 02/01/2023 2242   LEUKOCYTESUR NEGATIVE 02/01/2023 2242   Sepsis Labs: Invalid input(s): PROCALCITONIN, LACTICIDVEN  Microbiology: Recent Results (from the past 240 hours)  SARS Coronavirus 2 by RT PCR (hospital order, performed in [facility] hospital lab) *cepheid single result test* Nasopharyngeal Swab     Status: None   Collection Time: 02/02/23  6:40 AM   Specimen: Nasopharyngeal Swab; Nasal Swab   Result Value Ref Range Status   SARS Coronavirus 2 by RT PCR NEGATIVE NEGATIVE Final    Comment: Performed at [facility], [address], [place], [place] [address]  Respiratory (~20 pathogens) panel by PCR     Status: None   Collection Time: 02/02/23  6:40 AM   Specimen: Nasopharyngeal Swab; Respiratory  Result Value Ref Range Status   Adenovirus NOT DETECTED NOT DETECTED Final   Coronavirus 229E NOT DETECTED NOT DETECTED Final    Comment: (NOTE) The Coronavirus on the Respiratory Panel, DOES NOT test for the novel  Coronavirus (2019 nCoV)    Coronavirus HKU1 NOT DETECTED NOT DETECTED Final   Coronavirus NL63 NOT DETECTED NOT DETECTED Final   Coronavirus OC43 NOT DETECTED NOT DETECTED Final   Metapneumovirus NOT DETECTED NOT DETECTED Final   Rhinovirus / Enterovirus NOT DETECTED NOT DETECTED Final   Influenza A NOT DETECTED NOT DETECTED Final   Influenza B NOT DETECTED NOT DETECTED Final   Parainfluenza Virus 1 NOT DETECTED NOT DETECTED Final   Parainfluenza Virus 2 NOT DETECTED NOT DETECTED Final   Parainfluenza Virus 3 NOT DETECTED NOT DETECTED Final   Parainfluenza Virus 4 NOT DETECTED NOT DETECTED Final   Respiratory Syncytial Virus NOT DETECTED NOT DETECTED Final   Bordetella pertussis NOT DETECTED NOT DETECTED Final   Bordetella Parapertussis NOT DETECTED NOT DETECTED Final   Chlamydophila pneumoniae NOT DETECTED NOT DETECTED Final   Mycoplasma pneumoniae NOT DETECTED NOT DETECTED Final    Comment: Performed at [facility], [address] [place], [place] [address]  Acid Fast Smear (AFB)     Status: None   Collection Time: 02/03/23  9:05 AM   Specimen: Lung, Left; Pleural Fluid  Result Value Ref Range Status   AFB Specimen Processing Concentration  Final   Acid Fast Smear Negative  Final    Comment: (NOTE) Performed At: [facility] [address] [place], [place] [id number] Jennette Shorter MD Ey:[id number]    Source (AFB) PLEURAL  Final  Comment: Performed at [facility], [address], [place], [place] [address]    Radiology Studies: No results found.     Jadia Capers T. Myonna Chisom [name]  If 7PM-7AM, please contact night-coverage [email or web address] 02/06/2023, 1:51 PM

## 2023-02-07 ENCOUNTER — Other Ambulatory Visit (HOSPITAL_COMMUNITY): Payer: Self-pay

## 2023-02-07 ENCOUNTER — Telehealth: Payer: Self-pay | Admitting: Licensed Clinical Social Worker

## 2023-02-07 ENCOUNTER — Ambulatory Visit: Payer: HMO | Admitting: Cardiovascular Disease

## 2023-02-07 DIAGNOSIS — I5033 Acute on chronic diastolic (congestive) heart failure: Secondary | ICD-10-CM | POA: Diagnosis not present

## 2023-02-07 DIAGNOSIS — I639 Cerebral infarction, unspecified: Secondary | ICD-10-CM | POA: Diagnosis not present

## 2023-02-07 DIAGNOSIS — E1159 Type 2 diabetes mellitus with other circulatory complications: Secondary | ICD-10-CM | POA: Diagnosis not present

## 2023-02-07 DIAGNOSIS — R441 Visual hallucinations: Secondary | ICD-10-CM | POA: Diagnosis not present

## 2023-02-07 LAB — RENAL FUNCTION PANEL
Albumin: 2.2 g/dL — ABNORMAL LOW (ref 3.5–5.0)
Anion gap: 9 (ref 5–15)
BUN: 40 mg/dL — ABNORMAL HIGH (ref 8–23)
CO2: 31 mmol/L (ref 22–32)
Calcium: 8.4 mg/dL — ABNORMAL LOW (ref 8.9–10.3)
Chloride: 94 mmol/L — ABNORMAL LOW (ref 98–111)
Creatinine, Ser: 1.24 mg/dL (ref 0.61–1.24)
GFR, Estimated: >60 mL/min (ref >60)
Glucose, Bld: 158 mg/dL — ABNORMAL HIGH (ref 70–99)
Phosphorus: 3.9 mg/dL (ref 2.5–4.6)
Potassium: 4.7 mmol/L (ref 3.5–5.1)
Sodium: 134 mmol/L — ABNORMAL LOW (ref 135–145)

## 2023-02-07 LAB — CBC
HCT: 30 % — ABNORMAL LOW (ref 39.0–52.0)
Hemoglobin: 9.9 g/dL — ABNORMAL LOW (ref 13.0–17.0)
MCH: 29.7 pg (ref 26.0–34.0)
MCHC: 33 g/dL (ref 30.0–36.0)
MCV: 90.1 fL (ref 80.0–100.0)
Platelets: 236 10*3/uL (ref 150–400)
RBC: 3.33 MIL/uL — ABNORMAL LOW (ref 4.22–5.81)
RDW: 14.8 % (ref 11.5–15.5)
WBC: 6.2 10*3/uL (ref 4.0–10.5)
nRBC: 0 % (ref 0.0–0.2)

## 2023-02-07 LAB — GLUCOSE, CAPILLARY: Glucose-Capillary: 119 mg/dL — ABNORMAL HIGH (ref 70–99)

## 2023-02-07 LAB — MAGNESIUM: Magnesium: 2.1 mg/dL (ref 1.7–2.4)

## 2023-02-07 MED ORDER — AMLODIPINE BESYLATE 5 MG PO TABS: 5.0000 mg | ORAL_TABLET | Freq: Every day | ORAL | Status: DC

## 2023-02-07 MED ORDER — FUROSEMIDE 20 MG PO TABS
20.0000 mg | ORAL_TABLET | Freq: Every day | ORAL | 1 refills | Status: DC | PRN
  Filled 2023-02-07: qty 30, 30d supply, fill #0

## 2023-02-07 NOTE — TOC Transition Note (Signed)
 Transition of Care [facility]) - Discharge Note   Patient Details  Name: [name] [name] MRN: [id number] Date of Birth: 10-Sep-1955  Transition of Care [facility] CM/SW Contact:  Waddell Barnie Rama, RN Phone Number: 02/07/2023, 8:12 AM   Clinical Narrative:    For dc today, son will transport him home, NCM notified [facility] of dc today.         Patient Goals and CMS Choice            Discharge Placement                       Discharge Plan and Services Additional resources added to the After Visit Summary for                                       Social Drivers of Health (SDOH) Interventions SDOH Screenings   Food Insecurity: Food Insecurity Present (02/02/2023)  Housing: Unknown (02/02/2023)  Transportation Needs: No Transportation Needs (02/02/2023)  Recent Concern: Transportation Needs - Unmet Transportation Needs (11/21/2022)  Utilities: Not At Risk (02/02/2023)  Alcohol Screen: Low Risk  (10/23/2019)  Depression (PHQ2-9): High Risk (11/21/2022)  Financial Resource Strain: Low Risk  (11/21/2022)  Physical Activity: Inactive (11/21/2022)  Social Connections: Socially Isolated (02/02/2023)  Stress: Stress Concern Present (11/21/2022)  Tobacco Use: High Risk (02/01/2023)  Health Literacy: Adequate Health Literacy (11/21/2022)     Readmission Risk Interventions     No data to display

## 2023-02-07 NOTE — Progress Notes (Signed)
 Pt has orders to be discharged. Discharge instructions given and pt has no additional questions at this time. Medication regimen reviewed and pt educated. Pt stable and will wait for transportation in d/c lounge. D/C RN in d/c lounge will review instructions with son on arrival since the patient is forgetful.

## 2023-02-07 NOTE — Patient Outreach (Signed)
  Care Coordination   Follow Up Visit Note   02/06/2023 Name: [name] [name] MRN: [id number] DOB: [birth year]  Tyrie Porzio is a 68 y.o. year old male who sees [name], Mardy HERO, PA-C for primary care. I spoke with  Ethel Jama Saba by phone today.  What matters to the patients health and wellness today?  Pt is currently in hospital. Son provided update    Goals Addressed             This Visit's Progress    Obtain Supportive Resources-Caregiver Stress   On track    Activities and task to complete in order to accomplish goals.   Keep all upcoming appointments discussed today. Go to the ED as recommended by provider Continue with compliance of taking medication prescribed by Doctor Implement healthy coping skills discussed to assist with management of symptoms Continue working with [facility] care team to assist with goals identified         SDOH assessments and interventions completed:  No     Care Coordination Interventions:  Yes, provided  Interventions Today    Flowsheet Row Most Recent Value  Chronic Disease   Chronic disease during today's visit Hypertension (HTN), Diabetes, Atrial Fibrillation (AFib), Congestive Heart Failure (CHF), Other  [MDD]  General Interventions   General Interventions Discussed/Reviewed General Interventions Reviewed       Follow up plan: Follow up call scheduled for 2-4 weeks    Encounter Outcome:  Patient Visit Completed   Rolin Kerns, LCSW [place]  [facility], [facility] Clinical Social Worker Direct Dial : [phone]  Fax: [phone] Website: [email or web address] 5:12 PM

## 2023-02-07 NOTE — Discharge Summary (Signed)
 Physician Discharge Summary  [name] [name] FMW:[id number] DOB: 1955/10/27 DOA: 02/01/2023  PCP: [name] HERO, PA-C  Admit date: 02/01/2023 Discharge date: 02/07/23  Admitted From: Home Disposition: Home Recommendations for Outpatient Follow-up:  Outpatient follow-up with PCP as below Check blood pressure, fluid status, CMP and CBC at follow-up Please follow up on the following pending results: None  Home Health: [facility] PT/OT/RN Equipment/Devices: Patient has appropriate DME  Discharge Condition: Stable CODE STATUS: Full code  Follow-up Information     [name], [name], PA-C. Schedule an appointment as soon as possible for a visit in 1 week(s).   Specialty: Physician Assistant Contact information: [address] [place] [address] [phone]                 Hospital course 68 year old M with PMH of diastolic CHF, DM-2, CAD/CABG, CVA, HTN, PVD, paroxysmal A-fib with slow ventricular response on Eliquis , BPH, anxiety, depression, HLD and psoriasis sent to ED by PCP due to shortness of breath, cough, orthopnea, edema, weight gain and visual hallucination, and admitted with working diagnosis of acute diastolic CHF, hypertensive urgency and subacute stroke.  In ED, SBP as high as 213.  DBP as high as 105.  HR in 50s.  CBC, BMP, UA and UDS without significant finding other than proteinuria on UA.  BNP 458.  Troponin 29> 27.  CXR showed moderate bilateral pleural effusion, right> left.  CT head without acute finding.  MRI brain showed small foci of early subacute ischemia within the right frontal white matter and chronic microhemorrhage in the left cerebellum and both temporal lobes.  Neurology consulted.  Patient was started on Lasix  and antihypertensive meds and admitted.  Small foci of early subacute ischemia within the right frontal white matter.   The next day, evaluated by neurology.  CT angio head and neck without significant finding.  Eliquis  started.      Underwent left thoracocentesis with removal of 1.1 L transudative fluid on 2/2.  Right thoracocentesis with removal of 1.8 L on 2/3.  Diuretics and antihypertensive meds held due to hypotension, AKI and hyperkalemia.  Eventually, AKI, hyperkalemia and hypotension resolved.  He is discharged on p.o. Lasix  20 mg daily as needed.  Discontinued home losartan  and Aldactone .  Reassess fluid status and blood pressure and adjust meds as appropriate.  See individual problem list below for more.   Problems addressed during this hospitalization Acute on chronic HFpEF: Limited TTE with LVEF of 60 to 65%, and RVSP of 46 mmHg.  Presents with dyspnea, orthopnea, BLE edema and weight gain.  BNP 457>> 97..  CXR showed moderate bilateral pleural effusion, right> left.  Started on IV Lasix .  I&O incomplete.  Net -4.6 L.  Symptoms resolved.  BP and cr improved after holding diuretics. -S/p left thoracocentesis with removal of 1.1 L transudative fluid on 2/2. -S/p right thoracocentesis with removal of 1.8 L on 2/3. -Lasix  20 mg daily as needed.  Discontinued Aldactone  losartan . -Reassess fluid status, BP, renal functions and electrolytes at follow-up.   Subacute CVA: Hallucinations seems to have resolved.  No focal neurosymptoms or deficits on exam.  MRI brain showed small foci of early subacute ischemia within the right frontal white matter and chronic microhemorrhage in the left cerebellum and both temporal lobes.  CT angio without LVO major stenosis.  A1c 7.8%.  His LDL was 52 in 11/2022. -Neurology signed off and recommended continuing Eliquis .   Hypertensive urgency: SBP as high as 213 and DBP/105 in ED.  now normotensive without meds.  I query if he takes Aldactone  and losartan  before coming to the hospital. -Discontinued Aldactone  and losartan  partly due to AKI -P.o. Lasix  as needed   Bilateral pleural effusion: X-ray showed moderate bilateral pleural effusion, right> left. -S/p left thoracocentesis with  removal of 1.1 L transudative fluid on 2/2 -S/p right thoracocentesis with removal of 1.8 L clear straw-colored pleural fluid on 2/3   AKI: Baseline Cr ~0.9.  AKI likely due to diuretics and ARB.  Improved after discontinuing losartan  and Aldactone .. -Recheck at follow-up     Paroxysmal A-fib with bradycardia: HR in 50s-60s with positive chronotropic effect to exertion. -Avoid nodal blocking agents -Continue Eliquis    Elevated troponin/history of CAD/CABG: No chest pain.  Mildly elevated troponin likely demand ischemia -Continue home statin and cardiac meds.   Chronic normocytic anemia: Stable -Continue monitoring   Uncontrolled DM-2 with hyperglycemia/proteinuria: A1c 9.2% in 11/2022.  On glipizide  and Januvia  at home. -Continue home glipizide  and Januvia .   Mood disorder/visual hallucination: Hallucinations seems to have resolved.  UDS negative. -Continue mirtazapine , paroxetine , and quetiapine . -Discontinued Atarax .   Physical deconditioning: Lives alone.  Uses rollator at baseline -HH PT/OT/RN ordered.   Hypokalemia/hypomagnesemia: Resolved.   Hyperkalemia: Resolved   Hyponatremia: Mild.  Likely hypovolemic from diuretics, Aldactone  and losartan .  Improved.  Hyperlipidemia -Continue Lipitor.   Increased nutrient needs Body mass index is 17.67 kg/m. Nutrition Problem: Increased nutrient needs Etiology: acute illness Signs/Symptoms: estimated needs Interventions: Liberalize Diet, MVI, Juven   Pressure injuries of the Left and Right Coccyx stage 3,  Pressure Injury 05/23/20 Sacrum Stage 2 -  Partial thickness loss of dermis presenting as a shallow open injury with a red, pink wound bed without slough. (Active)  05/23/20 1000  Location: Sacrum  Location Orientation:   Staging: Stage 2 -  Partial thickness loss of dermis presenting as a shallow open injury with a red, pink wound bed without slough.  Wound Description (Comments):   Present on Admission:       Pressure Injury 02/06/23 Coccyx Left Stage 2 -  Partial thickness loss of dermis presenting as a shallow open injury with a red, pink wound bed without slough. (Active)  02/06/23 2045  Location: Coccyx  Location Orientation: Left  Staging: Stage 2 -  Partial thickness loss of dermis presenting as a shallow open injury with a red, pink wound bed without slough.  Wound Description (Comments):   Present on Admission: Yes  Dressing Type Foam - Lift dressing to assess site every shift 02/06/23 2005    Time spent 35 minutes  Vital signs Vitals:   02/06/23 1956 02/07/23 0110 02/07/23 0536 02/07/23 0725  BP: 139/71 137/83 117/69   Pulse: 73 (!) 57 (!) 48   Temp: 98 F (36.7 C) 98.2 F (36.8 C) 98.4 F (36.9 C) 98.5 F (36.9 C)  Resp: 17 16 18    Height:      Weight:   68.3 kg   SpO2: 96% 99% 96%   TempSrc: Oral Oral Oral Oral  BMI (Calculated):   17.85      Discharge exam  GENERAL: No apparent distress.  Nontoxic. HEENT: MMM.  Vision and hearing grossly intact.  NECK: Supple.  No apparent JVD.  RESP:  No IWOB.  Fair aeration bilaterally. CVS: Bradycardic to 50s but improved to 60s with exertion. Heart sounds normal.  ABD/GI/GU: BS+. Abd soft, NTND.  MSK/EXT:  Moves extremities.  Significant muscle mass and subcu fat loss.  BLE weakness SKIN: no  apparent skin lesion or wound NEURO: Awake and alert. Oriented appropriately.  No apparent focal neuro deficit. PSYCH: Calm. Normal affect.   Discharge Instructions Discharge Instructions     Diet - low sodium heart healthy   Complete by: As directed    Discharge instructions   Complete by: As directed    It has been a pleasure taking care of you!  You were hospitalized due to heart failure, stroke and uncontrolled hypertension.  His symptoms and blood pressure improved.  We had to stop your blood pressure medication due to low blood pressure.  Please review your new medication list and the directions on your medications before you  take them.  Follow-up with your primary care doctor in 1 to 2 weeks or sooner if needed.   Take care,   Increase activity slowly   Complete by: As directed    No wound care   Complete by: As directed       Allergies as of 02/07/2023       Reactions   Wool Alcohol [lanolin] Hives, Itching   Wool fabric        Medication List     STOP taking these medications    hydrOXYzine  25 MG tablet Commonly known as: ATARAX    losartan  50 MG tablet Commonly known as: COZAAR    spironolactone  25 MG tablet Commonly known as: ALDACTONE        TAKE these medications    acetaminophen  500 MG tablet Commonly known as: TYLENOL  Take 500 mg by mouth as needed for mild pain or moderate pain.   apixaban  5 MG Tabs tablet Commonly known as: Eliquis  Take 1 tablet (5 mg total) by mouth 2 (two) times daily.   aspirin  EC 81 MG tablet Take 1 tablet (81 mg total) by mouth daily. What changed: when to take this   atorvastatin  40 MG tablet Commonly known as: LIPITOR TAKE 1 TABLET BY MOUTH EVERY DAY What changed: when to take this   Cholecalciferol  25 MCG (1000 UT) capsule Take 1,000 Units by mouth daily.   furosemide  20 MG tablet Commonly known as: Lasix  Take 1 tablet (20 mg total) by mouth daily as needed for fluid or edema (Or shortness of breath).   glipiZIDE  5 MG tablet Commonly known as: GLUCOTROL  Take 1 tablet (5 mg total) by mouth daily before breakfast.   loperamide  2 MG capsule Commonly known as: IMODIUM  Take 1 capsule (2 mg total) by mouth 4 (four) times daily as needed for diarrhea or loose stools.   mirtazapine  15 MG tablet Commonly known as: REMERON  TAKE 1 TABLET BY MOUTH EVERYDAY AT BEDTIME   multivitamin with minerals tablet Take 1 tablet by mouth daily.   PARoxetine  20 MG tablet Commonly known as: PAXIL  TAKE 1 TABLET ONCE A DAY FOR DEPRESSION   QUEtiapine  25 MG tablet Commonly known as: SEROQUEL  GIVE 1 TABLET BY MOUTH AT BEDTIME FOR DELIRIUM   sitaGLIPtin   100 MG tablet Commonly known as: Januvia  Take 1 tablet (100 mg total) by mouth daily.        Consultations: Neurology  Procedures/Studies:   IR THORACENTESIS ASP PLEURAL SPACE W/IMG GUIDE Result Date: 02/04/2023 INDICATION: 68 year old male with a history of CHF, admitted for shortness of breath, lower extremity swelling, and altered mental status. Request for therapeutic right-sided thoracentesis. EXAM: ULTRASOUND GUIDED THERAPEUTIC THORACENTESIS MEDICATIONS: 8 cc of 1% lidocaine  COMPLICATIONS: None immediate. PROCEDURE: An ultrasound guided thoracentesis was thoroughly discussed with the patient and questions answered. The benefits, risks, alternatives and complications were  also discussed. The patient understands and wishes to proceed with the procedure. Written consent was obtained. Ultrasound was performed to localize and mark an adequate pocket of fluid in the right chest. The area was then prepped and draped in the normal sterile fashion. 1% Lidocaine  was used for local anesthesia. Under ultrasound guidance a 6 Fr Safe-T-Centesis catheter was introduced. Thoracentesis was performed. The catheter was removed and a dressing applied. FINDINGS: A total of approximately 1.8 L of clear, straw-colored pleural fluid was removed. IMPRESSION: Successful ultrasound guided RIGHT thoracentesis yielding 1.8 L of pleural fluid. Procedure performed by Carlin Griffon, PA-C Electronically Signed   By: Thom Hall M.D.   On: 02/04/2023 12:44   DG Chest 1 View Result Date: 02/04/2023 CLINICAL DATA:  Post thoracentesis on the right. EXAM: CHEST  1 VIEW COMPARISON:  02/03/2023 FINDINGS: Decreasing right pleural effusion following thoracentesis. No pneumothorax. Small to moderate bilateral pleural effusions remain with bibasilar opacities, favor atelectasis. Heart and mediastinal contours within normal limits. Prior CABG. Aortic atherosclerosis. IMPRESSION: Decreasing right effusion following thoracentesis.  No  pneumothorax. Small to moderate bilateral effusions with bibasilar atelectasis. Electronically Signed   By: Franky Crease M.D.   On: 02/04/2023 12:03   US  THORACENTESIS ASP PLEURAL SPACE W/IMG GUIDE Result Date: 02/03/2023 INDICATION: History of CHF. Admitted for shortness of breath, lower extremity swelling and altered mental status. Request is for therapeutic and diagnostic thoracentesis for further evaluation. EXAM: ULTRASOUND GUIDED THERAPEUTIC AND DIAGNOSTIC LEFT-SIDED THORACENTESIS MEDICATIONS: Lidocaine  1% 10 mL COMPLICATIONS: None immediate. PROCEDURE: An ultrasound guided thoracentesis was thoroughly discussed with the patient and questions answered. The benefits, risks, alternatives and complications were also discussed. The patient understands and wishes to proceed with the procedure. Written consent was obtained. Ultrasound was performed to localize and mark an adequate pocket of fluid in the left chest. The area was then prepped and draped in the normal sterile fashion. 1% Lidocaine  was used for local anesthesia. Under ultrasound guidance a 6 Fr Safe-T-Centesis catheter was introduced. Thoracentesis was performed. The catheter was removed and a dressing applied. FINDINGS: A total of approximately 1.1 L of straw-colored fluid was removed. Samples were sent to the laboratory as requested by the clinical team. IMPRESSION: Successful ultrasound guided therapeutic and diagnostic left-sided thoracentesis yielding 1.1 L of pleural fluid. Performed by Delon Beagle NP Electronically Signed   By: Juliene Balder M.D.   On: 02/03/2023 12:23   DG Chest 1 View Result Date: 02/03/2023 CLINICAL DATA:  Pleural effusion, status post left thoracentesis. EXAM: CHEST  1 VIEW COMPARISON:  02/01/2023 FINDINGS: Apical lordotic projection. Interval reduction in left pleural fluid. No appreciable pneumothorax. Vertically oblique lucency projecting over the left hemithorax attributed to skin fold/wrinkles. There is still  some blunting of the left lateral costophrenic angle. Moderate to large right pleural effusion. Prior CABG. Atherosclerotic calcification of the aortic arch. IMPRESSION: 1. Interval reduction in left pleural fluid. No appreciable pneumothorax. 2. Moderate to large right pleural effusion. 3. Prior CABG. 4. Aortic Atherosclerosis (ICD10-I70.0). Electronically Signed   By: Ryan Salvage M.D.   On: 02/03/2023 09:17   ECHOCARDIOGRAM LIMITED Result Date: 02/02/2023    ECHOCARDIOGRAM LIMITED REPORT   Patient Name:   OMARE BILOTTA Date of Exam: 02/02/2023 Medical Rec #:  [id number]            Height:       77.0 in Accession #:    [id number]           Weight:  168.0 lb Date of Birth:  [birth year]             BSA:          2.077 m Patient Age:    68 years             BP:           163/82 mmHg Patient Gender: M                    HR:           57 bpm. Exam Location:  Inpatient Procedure: Limited Echo and Limited Color Doppler Indications:    CHF- Acute Diastolic I50.31  History:        Patient has prior history of Echocardiogram examinations, most                 recent 11/21/2022. CHF, CAD, Prior CABG, Stroke,                 Arrythmias:Bradycardia and Atrial Fibrillation; Risk                 Factors:Hypertension, Diabetes and Dyslipidemia.  Sonographer:    Thea Norlander RCS Referring Phys: [id number] Kaylia Winborne T Tanika Bracco IMPRESSIONS  1. Left ventricular ejection fraction, by estimation, is 60 to 65%. Left ventricular ejection fraction by PLAX is 63 %. The left ventricle has normal function. The left ventricle has no regional wall motion abnormalities. There is mild asymmetric left ventricular hypertrophy of the basal-septal segment.  2. Right ventricular systolic function is normal. The right ventricular size is normal. There is moderately elevated pulmonary artery systolic pressure. The estimated right ventricular systolic pressure is 45.9 mmHg.  3. The mitral valve is abnormal. Mild to moderate mitral valve  regurgitation. Moderate mitral annular calcification.  4. The aortic valve is tricuspid. Aortic valve regurgitation is not visualized. Aortic valve sclerosis/calcification is present, without any evidence of aortic stenosis.  5. Aortic dilatation noted. There is borderline dilatation of the ascending aorta, measuring 38 mm.  6. The inferior vena cava is dilated in size with <50% respiratory variability, suggesting right atrial pressure of 15 mmHg. Comparison(s): Changes from prior study are noted. 11/21/2022: LVEF 55-60%, RVSP 32.6 mmHg. FINDINGS  Left Ventricle: Left ventricular ejection fraction, by estimation, is 60 to 65%. Left ventricular ejection fraction by PLAX is 63 %. The left ventricle has normal function. The left ventricle has no regional wall motion abnormalities. The left ventricular internal cavity size was normal in size. There is mild asymmetric left ventricular hypertrophy of the basal-septal segment. Right Ventricle: The right ventricular size is normal. No increase in right ventricular wall thickness. Right ventricular systolic function is normal. There is moderately elevated pulmonary artery systolic pressure. The tricuspid regurgitant velocity is 2.78 m/s, and with an assumed right atrial pressure of 15 mmHg, the estimated right ventricular systolic pressure is 45.9 mmHg. Mitral Valve: The mitral valve is abnormal. There is moderate calcification of the anterior mitral valve leaflet(s). Moderate mitral annular calcification. Mild to moderate mitral valve regurgitation. Tricuspid Valve: The tricuspid valve is grossly normal. Tricuspid valve regurgitation is trivial. Aortic Valve: The aortic valve is tricuspid. Aortic valve regurgitation is not visualized. Aortic valve sclerosis/calcification is present, without any evidence of aortic stenosis. Aortic valve peak gradient measures 3.1 mmHg. Aorta: Aortic dilatation noted. There is borderline dilatation of the ascending aorta, measuring 38 mm.  Venous: The inferior vena cava is dilated in size with less than 50% respiratory variability, suggesting  right atrial pressure of 15 mmHg. LEFT VENTRICLE PLAX 2D LV EF:         Left            Diastology                ventricular     LV e' medial:    7.53 cm/s                ejection        LV E/e' medial:  15.3                fraction by     LV e' lateral:   9.73 cm/s                PLAX is 63      LV E/e' lateral: 11.8                %. LVIDd:         3.90 cm LVIDs:         2.60 cm LV PW:         1.00 cm LV IVS:        1.20 cm LVOT diam:     2.10 cm LV SV:         53 LV SV Index:   26 LVOT Area:     3.46 cm  RIGHT VENTRICLE             IVC RV S prime:     13.90 cm/s  IVC diam: 2.40 cm TAPSE (M-mode): 1.5 cm LEFT ATRIUM         Index LA diam:    4.95 cm 2.38 cm/m  AORTIC VALVE AV Area (Vmax): 2.67 cm AV Vmax:        88.30 cm/s AV Peak Grad:   3.1 mmHg LVOT Vmax:      68.10 cm/s LVOT Vmean:     43.500 cm/s LVOT VTI:       0.154 m  AORTA Ao Root diam: 4.15 cm Ao Asc diam:  3.80 cm MITRAL VALVE                TRICUSPID VALVE MV Area (PHT): 3.89 cm     TR Peak grad:   30.9 mmHg MV Decel Time: 195 msec     TR Vmax:        278.00 cm/s MV E velocity: 115.00 cm/s                             SHUNTS                             Systemic VTI:  0.15 m                             Systemic Diam: 2.10 cm Vinie Maxcy MD Electronically signed by Vinie Maxcy MD Signature Date/Time: 02/02/2023/3:37:38 PM    Final    CT ANGIO HEAD NECK W WO CM Result Date: 02/02/2023 CLINICAL DATA:  Stroke/TIA, determine embolic source. Small recent right frontal white matter infarcts on MRI. EXAM: CT ANGIOGRAPHY HEAD AND NECK WITH AND WITHOUT CONTRAST TECHNIQUE: Multidetector CT imaging of the head and neck was performed using the standard protocol during bolus administration of intravenous contrast. Multiplanar CT image reconstructions and MIPs were obtained to evaluate the  vascular anatomy. Carotid stenosis measurements (when applicable)  are obtained utilizing NASCET criteria, using the distal internal carotid diameter as the denominator. RADIATION DOSE REDUCTION: This exam was performed according to the departmental dose-optimization program which includes automated exposure control, adjustment of the mA and/or kV according to patient size and/or use of iterative reconstruction technique. CONTRAST:  75mL OMNIPAQUE  IOHEXOL  350 MG/ML SOLN COMPARISON:  Head MRI 02/02/2023, head CT 02/01/2023, and head and neck CTA 11/29/2022 FINDINGS: CT HEAD FINDINGS Brain: There is no evidence of an acute cortically based infarct, intracranial hemorrhage, mass, midline shift, or extra-axial fluid collection. The recent punctate right cerebral hemispheric white matter infarcts on MRI are largely occult by CT. There is a background of mild chronic small vessel ischemia in the cerebral white matter. There is mild cerebral atrophy. Vascular: Calcified atherosclerosis at the skull base. Skull: No acute fracture or suspicious osseous lesion. Sinuses/Orbits: The paranasal sinuses and mastoid air cells are clear. Left cataract extraction. Other: None. Review of the MIP images confirms the above findings CTA NECK FINDINGS Aortic arch: Standard branching with mild-to-moderate atherosclerotic plaque in the distal arch. No significant stenosis of the arch vessel origins. Right carotid system: Patent with a moderate amount of mixed calcified and soft plaque at the carotid bifurcation resulting in less than 50% stenosis of the ICA origin, unchanged. Left carotid system: Patent with a moderate amount of mixed calcified and soft plaque in the carotid bulb not resulting in a significant stenosis, unchanged. Vertebral arteries: Patent and codominant without evidence of a stenosis, dissection, or significant atherosclerosis. Skeleton: Focally advanced disc degeneration at C5-6 and C6-7. Asymmetric left-sided facet arthrosis at multiple levels. Other neck: No evidence of cervical  lymphadenopathy or mass. Upper chest: Partially visualized moderate bilateral pleural effusions, right larger than left, with associated atelectasis. Review of the MIP images confirms the above findings CTA HEAD FINDINGS Anterior circulation: The internal carotid arteries are patent from skull base to carotid termini with calcified plaque resulting in mild cavernous and supraclinoid stenoses bilaterally, unchanged. A 2 mm protrusion from the right supraclinoid ICA is unchanged and may reflect a small aneurysm or infundibulum (series 13, image 125). ACAs and MCAs are patent without evidence of a proximal branch occlusion or significant proximal stenosis. Posterior circulation: The intracranial vertebral arteries are patent to the basilar with calcified plaque resulting in mild-to-moderate right and mild left V4 stenoses, unchanged. Patent PICA and SCA origins are visualized bilaterally. The basilar artery is widely patent. Posterior communicating arteries are diminutive or absent. Both PCAs are patent with branch vessel irregularity but no evidence of a significant proximal stenosis. No aneurysm is identified. Venous sinuses: Poorly evaluated due to contrast timing. Anatomic variants: None. Review of the MIP images confirms the above findings IMPRESSION: 1. Unchanged atherosclerosis in the head and neck without a large vessel occlusion. 2. Mild bilateral intracranial ICA stenoses and mild-to-moderate right and mild left V4 stenoses. 3. 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 4. Moderate bilateral pleural effusions. Electronically Signed   By: Dasie Hamburg M.D.   On: 02/02/2023 12:20   MR BRAIN WO CONTRAST Result Date: 02/02/2023 CLINICAL DATA:  Psychosis and abnormal cord in a shin EXAM: MRI HEAD WITHOUT CONTRAST TECHNIQUE: Multiplanar, multiecho pulse sequences of the brain and surrounding structures were obtained without intravenous contrast. COMPARISON:  11/29/2022 FINDINGS: Brain:  Small foci of early subacute ischemia within the right frontal white matter (series 6, images 71-72). Chronic microhemorrhage in the left cerebellum and both temporal lobes.  There is multifocal hyperintense T2-weighted signal within the white matter. Generalized volume loss. The midline structures are normal. Vascular: Normal flow voids. Skull and upper cervical spine: Normal calvarium and skull base. Visualized upper cervical spine and soft tissues are normal. Sinuses/Orbits:No paranasal sinus fluid levels or advanced mucosal thickening. No mastoid or middle ear effusion. Normal orbits. IMPRESSION: 1. Small foci of early subacute ischemia within the right frontal white matter. 2. Chronic microhemorrhage in the left cerebellum and both temporal lobes. Electronically Signed   By: Franky Stanford M.D.   On: 02/02/2023 02:54   CT Head Wo Contrast Result Date: 02/01/2023 CLINICAL DATA:  Head trauma EXAM: CT HEAD WITHOUT CONTRAST TECHNIQUE: Contiguous axial images were obtained from the base of the skull through the vertex without intravenous contrast. RADIATION DOSE REDUCTION: This exam was performed according to the departmental dose-optimization program which includes automated exposure control, adjustment of the mA and/or kV according to patient size and/or use of iterative reconstruction technique. COMPARISON:  11/29/2022 FINDINGS: Brain: No evidence of acute infarction, hemorrhage, mass, mass effect, or midline shift. No hydrocephalus or extra-axial fluid collection. Vascular: No hyperdense vessel. Atherosclerotic calcifications in the intracranial carotid and vertebral arteries. Skull: Negative for fracture or focal lesion. Sinuses/Orbits: No acute finding. Other: The mastoid air cells are well aerated. IMPRESSION: No acute intracranial process. Electronically Signed   By: Donald Campion M.D.   On: 02/01/2023 19:31   DG Chest 2 View Result Date: 02/01/2023 CLINICAL DATA:  Shortness of breath EXAM: CHEST - 2  VIEW COMPARISON:  11/24/2022 FINDINGS: Stable heart size status post sternotomy and CABG. Aortic atherosclerosis. Moderate pleural effusions, right greater than left. Associated bibasilar opacities. No pneumothorax. IMPRESSION: Moderate pleural effusions, right greater than left. Associated bibasilar opacities, atelectasis versus pneumonia. Electronically Signed   By: Mabel Converse D.O.   On: 02/01/2023 17:46   DG Abd 2 Views Result Date: 01/25/2023 CLINICAL DATA:  Diarrhea with nausea and vomiting. Evaluate stool burden. EXAM: ABDOMEN - 2 VIEW COMPARISON:  CT abdomen and pelvis 11/24/2022. FINDINGS: Bilateral pleural effusions are again seen, right greater than left. The transverse colon is borderline dilated and air-filled. Air seen throughout the entire colon to the level of the rectum. There is a large amount of stool in the ascending colon, but otherwise stool burden is average to low. No evidence for bowel obstruction. There severe atherosclerotic calcifications of the aorta and iliac arteries similar to prior. Sternotomy wires are present. No acute fractures are seen. IMPRESSION: 1. Large amount of stool in the ascending colon, but otherwise stool burden is average to low. 2. Borderline dilated air-filled transverse colon. No evidence for bowel obstruction. 3. Bilateral pleural effusions, right greater than left. Electronically Signed   By: Greig Pique M.D.   On: 01/25/2023 14:51       The results of significant diagnostics from this hospitalization (including imaging, microbiology, ancillary and laboratory) are listed below for reference.     Microbiology: Recent Results (from the past 240 hours)  SARS Coronavirus 2 by RT PCR (hospital order, performed in [facility] hospital lab) *cepheid single result test* Nasopharyngeal Swab     Status: None   Collection Time: 02/02/23  6:40 AM   Specimen: Nasopharyngeal Swab; Nasal Swab  Result Value Ref Range Status   SARS Coronavirus 2 by RT PCR  NEGATIVE NEGATIVE Final    Comment: Performed at [name], [address] [place], [place] [address]  Respiratory (~20 pathogens) panel by PCR  Status: None   Collection Time: 02/02/23  6:40 AM   Specimen: Nasopharyngeal Swab; Respiratory  Result Value Ref Range Status   Adenovirus NOT DETECTED NOT DETECTED Final   Coronavirus 229E NOT DETECTED NOT DETECTED Final    Comment: (NOTE) The Coronavirus on the Respiratory Panel, DOES NOT test for the novel  Coronavirus (2019 nCoV)    Coronavirus HKU1 NOT DETECTED NOT DETECTED Final   Coronavirus NL63 NOT DETECTED NOT DETECTED Final   Coronavirus OC43 NOT DETECTED NOT DETECTED Final   Metapneumovirus NOT DETECTED NOT DETECTED Final   Rhinovirus / Enterovirus NOT DETECTED NOT DETECTED Final   Influenza A NOT DETECTED NOT DETECTED Final   Influenza B NOT DETECTED NOT DETECTED Final   Parainfluenza Virus 1 NOT DETECTED NOT DETECTED Final   Parainfluenza Virus 2 NOT DETECTED NOT DETECTED Final   Parainfluenza Virus 3 NOT DETECTED NOT DETECTED Final   Parainfluenza Virus 4 NOT DETECTED NOT DETECTED Final   Respiratory Syncytial Virus NOT DETECTED NOT DETECTED Final   Bordetella pertussis NOT DETECTED NOT DETECTED Final   Bordetella Parapertussis NOT DETECTED NOT DETECTED Final   Chlamydophila pneumoniae NOT DETECTED NOT DETECTED Final   Mycoplasma pneumoniae NOT DETECTED NOT DETECTED Final    Comment: Performed at [facility], [address] [place], [place] [address]  Acid Fast Smear (AFB)     Status: None   Collection Time: 02/03/23  9:05 AM   Specimen: Lung, Left; Pleural Fluid  Result Value Ref Range Status   AFB Specimen Processing Concentration  Final   Acid Fast Smear Negative  Final    Comment: (NOTE) Performed At: [facility] [address] [place], [place] [id number] Jennette Shorter MD Ey:[id number]    Source (AFB) PLEURAL  Final    Comment: Performed at [facility], [address]  [place], [place] [address]     Labs:  CBC: Recent Labs  Lab 02/01/23 1625 02/03/23 0255 02/05/23 0254 02/06/23 0255 02/07/23 0251  WBC 5.5 5.5 7.0 6.7 6.2  HGB 10.1* 9.1* 9.5* 9.4* 9.9*  HCT 31.7* 27.9* 28.9* 29.1* 30.0*  MCV 93.5 91.2 91.5 91.2 90.1  PLT 227 206 206 218 236   BMP &GFR Recent Labs  Lab 02/02/23 0649 02/03/23 0255 02/04/23 0244 02/05/23 0254 02/06/23 0255 02/07/23 0251  NA  --  137 141 134* 133* 134*  K  --  3.9 4.3 5.3* 4.8 4.7  CL  --  99 102 94* 93* 94*  CO2  --  29 29 31  32 31  GLUCOSE  --  149* 225* 139* 152* 158*  BUN  --  13 20 36* 49* 40*  CREATININE  --  0.92 1.18 1.26* 1.50* 1.24  CALCIUM   --  8.2* 8.3* 8.3* 8.5* 8.4*  MG 1.5* 1.8  --  1.7 1.8 2.1  PHOS  --  3.7  --  4.0 4.5 3.9   Estimated Creatinine Clearance: 53.6 mL/min (by C-G formula based on SCr of 1.24 mg/dL). Liver & Pancreas: Recent Labs  Lab 02/02/23 0640 02/03/23 0255 02/05/23 0254 02/06/23 0255 02/07/23 0251  AST 18  --   --   --   --   ALT 12  --   --   --   --   ALKPHOS 119  --   --   --   --   BILITOT 0.6  --   --   --   --   PROT 6.0*  --   --   --   --  ALBUMIN 2.6* 2.5* 2.3* 2.3* 2.2*   No results for input(s): LIPASE, AMYLASE in the last 168 hours. No results for input(s): AMMONIA in the last 168 hours. Diabetic: No results for input(s): HGBA1C in the last 72 hours. Recent Labs  Lab 02/06/23 0643 02/06/23 1130 02/06/23 1605 02/06/23 2130 02/07/23 0615  GLUCAP 122* 124* 107* 209* 119*   Cardiac Enzymes: No results for input(s): CKTOTAL, CKMB, CKMBINDEX, TROPONINI in the last 168 hours. No results for input(s): PROBNP in the last 8760 hours. Coagulation Profile: No results for input(s): INR, PROTIME in the last 168 hours. Thyroid  Function Tests: No results for input(s): TSH, T4TOTAL, FREET4, T3FREE, THYROIDAB in the last 72 hours. Lipid Profile: No results for input(s): CHOL, HDL, LDLCALC, TRIG, CHOLHDL,  LDLDIRECT in the last 72 hours. Anemia Panel: No results for input(s): VITAMINB12, FOLATE, FERRITIN, TIBC, IRON , RETICCTPCT in the last 72 hours. Urine analysis:    Component Value Date/Time   COLORURINE YELLOW 02/01/2023 2242   APPEARANCEUR HAZY (A) 02/01/2023 2242   LABSPEC 1.017 02/01/2023 2242   PHURINE 5.0 02/01/2023 2242   GLUCOSEU 50 (A) 02/01/2023 2242   GLUCOSEU >=1000 (A) 03/16/2020 0958   HGBUR SMALL (A) 02/01/2023 2242   BILIRUBINUR NEGATIVE 02/01/2023 2242   BILIRUBINUR Negative 12/31/2022 1222   KETONESUR NEGATIVE 02/01/2023 2242   PROTEINUR >=300 (A) 02/01/2023 2242   UROBILINOGEN 1.0 12/31/2022 1222   UROBILINOGEN 0.2 03/16/2020 0958   NITRITE NEGATIVE 02/01/2023 2242   LEUKOCYTESUR NEGATIVE 02/01/2023 2242   Sepsis Labs: Invalid input(s): PROCALCITONIN, LACTICIDVEN   SIGNED:  Taima Rada T Minna Dumire, MD  [facility] 02/07/2023, 3:47 PM

## 2023-02-08 ENCOUNTER — Telehealth: Payer: Self-pay | Admitting: *Deleted

## 2023-02-08 ENCOUNTER — Ambulatory Visit: Payer: PPO | Admitting: Cardiovascular Disease

## 2023-02-08 ENCOUNTER — Encounter: Payer: Self-pay | Admitting: *Deleted

## 2023-02-08 NOTE — Transitions of Care (Post Inpatient/ED Visit) (Signed)
 02/08/2023  Name: [name] [name] MRN: [id number] DOB: [birth year]  Today's TOC FU Call Status: Today's TOC FU Call Status:: Successful TOC FU Call Completed TOC FU Call Complete Date: 02/08/23 Patient's Name and Date of Birth confirmed.  Transition Care Management Follow-up Telephone Call Date of Discharge: 02/07/23 Discharge Facility: [name]) Type of Discharge: Inpatient Admission Primary Inpatient Discharge Diagnosis:: Acute on chronic heart failure with preserved ejection fraction How have you been since you were released from the hospital?: Better (appetite fair, ambulating w/ walker, incontinent, wears adult diapers, has HH to assist a/ wound care and son provides oversight w/ meds, appts etc.) Any questions or concerns?: No  Items Reviewed: Did you receive and understand the discharge instructions provided?: Yes Medications obtained,verified, and reconciled?: Yes (Medications Reviewed) Any new allergies since your discharge?: No Dietary orders reviewed?: Yes Type of Diet Ordered:: low sodium   heart healthy Do you have support at home?: Yes People in Home: alone Name of Support/Comfort Primary Source: son Chancellor Vanderloop,  ongoing home health- monitors wound Patient's son declines enrollment in 30 day program citing he already speaks with social worker q 2 weeks and has home health as a resource, son works and is busy with patient appointments, etc. Reviewed importance of daily weights, pt is not able to weigh and would be a safety issue, pt uses walker Medical Illustrator reviewed HF action plan, importance of calling doctor early on for change in heatlh status/ symptoms Patient's son consulted with an attorney and pt would most likely qualify for long term skilled care but not ready for this level of care yet, may consider in the future. Patient has stage 2 sacral wound, home health provides wound care per son, pt does have pretty good intake of protein Patient has  Dexcom to monitor blood sugar, son states  but he really doesn't pay attention to it Interventions Today    Flowsheet Row Most Recent Value  Chronic Disease   Chronic disease during today's visit Congestive Heart Failure (CHF)  Nutrition Interventions   Nutrition Discussed/Reviewed Nutrition Discussed, Nutrition Reviewed  [adequate protein in diet, being mindful of carbohydrate intake, include fruits and vegetables (fresh/ frozen)]  Safety Interventions   Safety Discussed/Reviewed Safety Discussed, Safety Reviewed        Medications Reviewed Today: Medications Reviewed Today     Reviewed by Aura Mliss LABOR, RN (Registered Nurse) on 02/08/23 at [id number]  Med List Status: <None>   Medication Order Taking? Sig Documenting Provider Last Dose Status Informant  acetaminophen  (TYLENOL ) 500 MG tablet [id number] Yes Take 500 mg by mouth as needed for mild pain or moderate pain. [provider] Taking Active Child, Pharmacy Records  apixaban  (ELIQUIS ) 5 MG TABS tablet [id number] Yes Take 1 tablet (5 mg total) by mouth 2 (two) times daily. Barbaraann Darryle Ned, MD Taking Active Child, Pharmacy Records  aspirin  EC 81 MG EC tablet [id number] Yes Take 1 tablet (81 mg total) by mouth daily.  Patient taking differently: Take 81 mg by mouth in the morning.   Fairy Frames, MD Taking Active Child, Pharmacy Records  atorvastatin  (LIPITOR) 40 MG tablet [id number] Yes TAKE 1 TABLET BY MOUTH EVERY DAY  Patient taking differently: Take 40 mg by mouth at bedtime.   [name], Mardy HERO, PA-C Taking Active Child, Pharmacy Records  Cholecalciferol  25 MCG (1000 UT) capsule [id number] Yes Take 1,000 Units by mouth daily. [provider] Taking Active Child, Pharmacy Records  furosemide  (LASIX ) 20 MG tablet [id number] Yes  Take 1 tablet (20 mg total) by mouth daily as needed for fluid or edema (Or shortness of breath). Gonfa, Taye T, MD Taking Active   glipiZIDE  (GLUCOTROL ) 5 MG tablet [id number] Yes Take  1 tablet (5 mg total) by mouth daily before breakfast. Shamleffer, Ibtehal Jaralla, MD Taking Active Child, Pharmacy Records  loperamide  (IMODIUM ) 2 MG capsule [id number] Yes Take 1 capsule (2 mg total) by mouth 4 (four) times daily as needed for diarrhea or loose stools. Wendolyn Jenkins Jansky, MD Taking Active Child, Pharmacy Records  mirtazapine  (REMERON ) 15 MG tablet [id number] Yes TAKE 1 TABLET BY MOUTH EVERYDAY AT BEDTIME [name], Mardy HERO, PA-C Taking Active Child, Pharmacy Records  Multiple Vitamins-Minerals (MULTIVITAMIN WITH MINERALS) tablet [id number] Yes Take 1 tablet by mouth daily. [provider] Taking Active Child, Pharmacy Records  PARoxetine  (PAXIL ) 20 MG tablet [id number] Yes TAKE 1 TABLET ONCE A DAY FOR DEPRESSION [name], Mardy HERO, PA-C Taking Active Child, Pharmacy Records  QUEtiapine  (SEROQUEL ) 25 MG tablet [id number] Yes GIVE 1 TABLET BY MOUTH AT BEDTIME FOR DELIRIUM [name], [name], PA-C Taking Active Child, Pharmacy Records  sitaGLIPtin  (JANUVIA ) 100 MG tablet [id number] Yes Take 1 tablet (100 mg total) by mouth daily. Shamleffer, Donell Cardinal, MD Taking Active Child, Pharmacy Records            Home Care and Equipment/Supplies: Were Home Health Services Ordered?: Yes Name of Home Health Agency:: [facility] agency contacted pts son to schedule home visit) Has Agency set up a time to come to your home?: Yes First Home Health Visit Date: 02/09/23 Any new equipment or medical supplies ordered?: No  Functional Questionnaire: Do you need assistance with bathing/showering or dressing?: Yes (pt can do sponge bath,  pt refuses bath with HH aide) Do you need assistance with meal preparation?: No (pt can go in kitchen and make small meal, sandwich) Do you need assistance with eating?: No Do you have difficulty maintaining continence: Yes (adult diapers) Do you need assistance with getting out of bed/getting out of a chair/moving?: Yes (uses walker) Do you have  difficulty managing or taking your medications?: Yes (son provides oversight)  Follow up appointments reviewed: PCP Follow-up appointment confirmed?: Yes Date of PCP follow-up appointment?: 02/19/23 Follow-up Provider: [facility]  Alyssa [name]  @ 830 am Specialist Hospital Follow-up appointment confirmed?: Yes Date of Specialist follow-up appointment?: 02/14/23 Follow-Up Specialty Provider:: endocrinologist  dR. Shamleffer  @ 730 am Do you need transportation to your follow-up appointment?: No Do you understand care options if your condition(s) worsen?: Yes-patient verbalized understanding  SDOH Interventions Today    Flowsheet Row Most Recent Value  SDOH Interventions   Food Insecurity Interventions Other (Comment)  [son states pt already working w/ LCSW and previously provided resources, does not need care guide to [employer]  Housing Interventions Intervention Not Indicated  Transportation Interventions Intervention Not Indicated  [son provides transporatation]  Utilities Interventions Intervention Not Indicated       Mliss [facility], BSN RN Care Manager/ Transition of Care / [facility] [phone]

## 2023-02-14 ENCOUNTER — Encounter: Payer: Self-pay | Admitting: Internal Medicine

## 2023-02-14 ENCOUNTER — Ambulatory Visit: Payer: PPO | Admitting: Internal Medicine

## 2023-02-14 VITALS — BP 116/72 | HR 68 | Ht 77.0 in | Wt 155.0 lb

## 2023-02-14 DIAGNOSIS — E113593 Type 2 diabetes mellitus with proliferative diabetic retinopathy without macular edema, bilateral: Secondary | ICD-10-CM | POA: Diagnosis not present

## 2023-02-14 DIAGNOSIS — E1165 Type 2 diabetes mellitus with hyperglycemia: Secondary | ICD-10-CM | POA: Diagnosis not present

## 2023-02-14 DIAGNOSIS — E1159 Type 2 diabetes mellitus with other circulatory complications: Secondary | ICD-10-CM

## 2023-02-14 DIAGNOSIS — E1142 Type 2 diabetes mellitus with diabetic polyneuropathy: Secondary | ICD-10-CM | POA: Diagnosis not present

## 2023-02-14 DIAGNOSIS — Z794 Long term (current) use of insulin: Secondary | ICD-10-CM | POA: Diagnosis not present

## 2023-02-14 MED ORDER — GLIPIZIDE 10 MG PO TABS: 10.0000 mg | ORAL_TABLET | Freq: Every day | ORAL | 3 refills | Status: DC

## 2023-02-14 MED ORDER — SITAGLIPTIN PHOSPHATE 100 MG PO TABS: 100.0000 mg | ORAL_TABLET | Freq: Every day | ORAL | 3 refills | Status: DC

## 2023-02-14 MED ORDER — DEXCOM G7 SENSOR MISC: 1.0000 | 3 refills | Status: DC

## 2023-02-14 NOTE — Progress Notes (Signed)
Name: [name] [name]  Age/ Sex: 68 y.o., male   MRN/ DOB: [id number], 1955/08/16     PCP: Bary Leriche, PA-C   Reason for Endocrinology Evaluation: Type 2 Diabetes Mellitus  Initial Endocrine Consultative Visit: 07/15/2020    PATIENT IDENTIFIER: [name] [name] is a 68 y.o. male with a past medical history of DM, CAD, HTN, PVD, PAF. The patient has followed with Endocrinology clinic since 07/15/2020 for consultative assistance with management of his diabetes.  DIABETIC HISTORY:  [name] [name] was diagnosed with DM in 2011, and started insulin therapy in 2013 through his previous endocrinologist. His hemoglobin A1c has ranged from 9.2% in 2023, peaking at >15.0% in 2022.  Saw Dr. Everardo All last in 04/2021    Insulin pump was discontinued during hospitalization 11/2022 and was deemed not fit due to noncompliance.  He was discharged on metformin and Januvia  By 01/2023 I discontinued metformin and started him on glipizide and increase Januvia   SUBJECTIVE:   During the last visit (01/09/2023): This was a virtual visit   Today (02/14/2023): [name] [name] is here for a follow up on diabetes management.  He checks his blood sugars multiple  times daily.     The patient presented to the ED 02/01/2023 with a working diagnosis of acute CHF and hypertensive urgency.  SBP 213 mmhg. he was noted with elevated BNP and troponin, chest x-ray showed bilateral pleural effusion.  CT head no acute finding but MRI showed small foci of early subacute ischemia within the right frontal white matter and chronic microhemorrhages in the left cerebellum and both temporal lobes .  He was evaluated by neurology and Eliquis was started.  Patient underwent left thoracocentesis with removal of 1.1 L of transudative fluid and right thoracocentesis with removal of 1.8 L .  He was discharged on Lasix Aldactone and losartan were discontinued   Breathing has improved  Pt on Loperamide for fecal incontinence,  patient with chronic diarrhea, saw GI   Denies nausea, vomiting     HOME DIABETES REGIMEN:  Januvia 100 mg daily  Glipizide 5 mg, 1 tab daily   Statin: yes ACE-I/ARB: yes    CONTINUOUS GLUCOSE MONITORING RECORD INTERPRETATION    Dates of Recording: 1/30-2/12/2023  Sensor description:dexcom  Results statistics:   CGM use % of time 43  Average and SD 268/72  Time in range 0%  % Time Above 180 64  % Time above 250 36  % Time Below target 0      Glycemic patterns summary: Hyperglycemia noted during the day and night   Hyperglycemic episodes all day and night  Hypoglycemic episodes occurred N/A  Overnight periods: High     DIABETIC COMPLICATIONS: Microvascular complications:  Retinopathy B/L , blind in left eye  Denies: CKD Last Eye Exam: Completed 05/11/2022  Macrovascular complications:  CAD (S/P CABG) Denies: CVA,   HISTORY:  Past Medical History:  Past Medical History:  Diagnosis Date   Anxiety    Arthritis    Cataract    CHF (congestive heart failure) (HCC)    Constipation    pt states goes EOD- hard stools    Coronary artery disease    Depression    Diabetes mellitus without complication (HCC)    Diabetic retinopathy (HCC)    DKA (diabetic ketoacidoses)    Hyperlipidemia    Hypertension    off meds   Hypertensive retinopathy    Non-intractable vomiting without nausea 06/10/2018   Postherpetic neuralgia 08/11/2021  Speculative diagnosis   Psoriasis    Tubular adenoma of colon 03/2019   Past Surgical History:  Past Surgical History:  Procedure Laterality Date   ABDOMINAL AORTOGRAM W/LOWER EXTREMITY N/A 08/30/2020   Procedure: ABDOMINAL AORTOGRAM W/LOWER EXTREMITY;  Surgeon: Nada Libman, MD;  Location: [facility];  Service: Cardiovascular;  Laterality: N/A;   ABDOMINAL AORTOGRAM W/LOWER EXTREMITY N/A 11/15/2020   Procedure: ABDOMINAL AORTOGRAM W/LOWER EXTREMITY;  Surgeon: Nada Libman, MD;  Location: [facility];  Service: Cardiovascular;  Laterality: N/A;   ANKLE SURGERY     right   CARDIAC CATHETERIZATION  09/25/2001   COLONOSCOPY     ~10 yr ago- normal  per pt    CORONARY ARTERY BYPASS GRAFT  09/30/2001   CABG x 4   IR THORACENTESIS ASP PLEURAL SPACE W/IMG GUIDE  05/19/2020   IR THORACENTESIS ASP PLEURAL SPACE W/IMG GUIDE  02/04/2023   LAPAROSCOPIC INGUINAL HERNIA REPAIR Bilateral 02/09/2010   MASS EXCISION Left 03/12/2013   Procedure: LEFT INDEX EXCISION MASS AND DEBRIDMENT DISTAL INTERPHALANGEAL JOINT;  Surgeon: Tami Ribas, MD;  Location: [facility];  Service: Orthopedics;  Laterality: Left;   PERIPHERAL VASCULAR BALLOON ANGIOPLASTY Left 08/30/2020   Procedure: PERIPHERAL VASCULAR BALLOON ANGIOPLASTY;  Surgeon: Nada Libman, MD;  Location: [facility];  Service: Cardiovascular;  Laterality: Left;  Peroneal   TRANSESOPHAGEAL ECHOCARDIOGRAM (CATH LAB) N/A 11/26/2022   Procedure: TRANSESOPHAGEAL ECHOCARDIOGRAM;  Surgeon: [name], MD;  Location: [facility] INVASIVE CV LAB;  Service: Cardiovascular;  Laterality: N/A;   Social History:  reports that he has been smoking cigars. He has never used smokeless tobacco. He reports current alcohol use. He reports that he does not use drugs. Family History:  Family History  Problem Relation Age of Onset   Lymphoma Father    Cancer Paternal Grandmother        Colon Cancer   Colon cancer Paternal Grandmother    Colon polyps Neg Hx    Esophageal cancer Neg Hx    Rectal cancer Neg Hx    Stomach cancer Neg Hx    Diabetes Mellitus I Neg Hx    Pancreatic cancer Neg Hx      HOME MEDICATIONS: Allergies as of 02/14/2023       Reactions   Wool Alcohol [lanolin] Hives, Itching   Wool fabric        Medication List        Accurate as of February 14, 2023  7:39 AM. If you have any questions, ask your nurse or doctor.          acetaminophen 500 MG tablet Commonly known as: TYLENOL Take 500 mg by mouth as needed  for mild pain or moderate pain.   apixaban 5 MG Tabs tablet Commonly known as: Eliquis Take 1 tablet (5 mg total) by mouth 2 (two) times daily.   aspirin EC 81 MG tablet Take 1 tablet (81 mg total) by mouth daily. What changed: when to take this   atorvastatin 40 MG tablet Commonly known as: LIPITOR TAKE 1 TABLET BY MOUTH EVERY DAY What changed: when to take this   Cholecalciferol 25 MCG (1000 UT) capsule Take 1,000 Units by mouth daily.   furosemide 20 MG tablet Commonly known as: Lasix Take 1 tablet (20 mg total) by mouth daily as needed for fluid or edema (Or shortness of breath).   glipiZIDE 5 MG tablet Commonly known as: GLUCOTROL Take 1 tablet (5 mg total)  by mouth daily before breakfast.   loperamide 2 MG capsule Commonly known as: IMODIUM Take 1 capsule (2 mg total) by mouth 4 (four) times daily as needed for diarrhea or loose stools.   mirtazapine 15 MG tablet Commonly known as: REMERON TAKE 1 TABLET BY MOUTH EVERYDAY AT BEDTIME   multivitamin with minerals tablet Take 1 tablet by mouth daily.   PARoxetine 20 MG tablet Commonly known as: PAXIL TAKE 1 TABLET ONCE A DAY FOR DEPRESSION   QUEtiapine 25 MG tablet Commonly known as: SEROQUEL GIVE 1 TABLET BY MOUTH AT BEDTIME FOR DELIRIUM   sitaGLIPtin 100 MG tablet Commonly known as: Januvia Take 1 tablet (100 mg total) by mouth daily.         OBJECTIVE:   Vital Signs: BP 116/72 (BP Location: Right Arm, Patient Position: Sitting, Cuff Size: Small)   Pulse 68   Ht 6\' 5"  (1.956 m)   Wt 155 lb (70.3 kg)   SpO2 95%   BMI 18.38 kg/m   Wt Readings from Last 3 Encounters:  02/14/23 155 lb (70.3 kg)  02/07/23 150 lb 9.2 oz (68.3 kg)  02/01/23 168 lb (76.2 kg)     Exam: General: Pt appears well and is in NAD  Lungs: Clear with good BS bilat   Heart: RRR   Neuro: MS is good with appropriate affect, pt is alert and Ox3   Dm Foot Exam 09/05/2022 per podiatry      DATA REVIEWED:  Lab Results   Component Value Date   HGBA1C 7.8 (H) 02/03/2023   HGBA1C 9.2 (H) 11/30/2022   HGBA1C 9.8 (A) 11/13/2022    Latest Reference Range & Units 02/07/23 02:51  Sodium 135 - 145 mmol/L 134 (L)  Potassium 3.5 - 5.1 mmol/L 4.7  Chloride 98 - 111 mmol/L 94 (L)  CO2 22 - 32 mmol/L 31  Glucose 70 - 99 mg/dL 063 (H)  BUN 8 - 23 mg/dL 40 (H)  Creatinine 0.16 - 1.24 mg/dL 0.10  Calcium 8.9 - 93.2 mg/dL 8.4 (L)  Anion gap 5 - 15  9  Phosphorus 2.5 - 4.6 mg/dL 3.9  Magnesium 1.7 - 2.4 mg/dL 2.1  Albumin 3.5 - 5.0 g/dL 2.2 (L)  GFR, Estimated >60 mL/min >60  Old records , labs and images have been reviewed.    ASSESSMENT / PLAN / RECOMMENDATIONS:   1) Type 2 Diabetes Mellitus, Sub-optimally controlled, With retinopathic , neuropathic, retinopathic and macrovascular  complications - Most recent A1c of 7.8 %. Goal A1c < 7.0 %.     -A1c has trended down from 9.2% to 7.8% while on Januvia and glipizide -This is an indication that his pancreas is able to produce endogenous insulin -I will increase his glipizide as below, as he continues with hypoglycemia on CGM download -I will upgrade Dexcom from G6-G7  MEDICATIONS: Increase glipizide 10 mg daily Continue Januvia 100 mg daily        EDUCATION / INSTRUCTIONS: BG monitoring instructions: Patient is instructed to check his blood sugars 4 times a day, before each meal and bedtime. Call [facility] if: BG persistently < 70  I reviewed the Rule of 15 for the treatment of hypoglycemia in detail with the patient. Literature supplied.     2) Diabetic complications:  Eye: Does  have known diabetic retinopathy.  Neuro/ Feet: Does have known diabetic peripheral neuropathy .  Renal: Patient does not have known baseline CKD. He   is  on an ACEI/ARB at present.  Follow-up in 4 months  I spent 25 minutes preparing to see the patient by review of recent labs, imaging and procedures, obtaining and reviewing separately obtained  history, communicating with the patient/family or caregiver, ordering medications, tests or procedures, and documenting clinical information in the EHR including the differential Dx, treatment, and any further evaluation and other management   Signed electronically by: Lyndle Herrlich, MD  [facility]  [facility] [address] [place] [place], [place] [address] Phone: [phone] FAX: [phone]   CC: [name], [name], PA-C [address] [place] [address] Phone: [phone]  Fax: [phone]  Return to Endocrinology clinic as below: Future Appointments  Date Time Provider Department Center  02/15/2023  1:30 PM Jenel Lucks D, LCSW [facility] None  02/19/2023  8:30 AM [name], [name], PA-C [facility]  03/01/2023  8:00 AM [facility] NL VASC 1 [facility]  03/01/2023  9:00 AM [facility] VASC 1 [facility]  03/15/2023  1:20 PM O'Neal, Ronnald Ramp, MD [facility] None  03/18/2023  9:20 AM Legrand Como, PA-C [facility]  08/15/2023 10:00 AM Butch Penny, NP [facility] None  12/02/2023  1:40 PM [facility] ANNUAL WELLNESS VISIT 1 [facility]

## 2023-02-14 NOTE — Patient Instructions (Signed)
Increase Glipizide 10 mg, 1 tablet daily  Continue Januvia 100 mg daily     HOW TO TREAT LOW BLOOD SUGARS (Blood sugar LESS THAN 70 MG/DL) Please follow the RULE OF 15 for the treatment of hypoglycemia treatment (when your (blood sugars are less than 70 mg/dL)   STEP 1: Take 15 grams of carbohydrates when your blood sugar is low, which includes:  3-4 GLUCOSE TABS  OR 3-4 OZ OF JUICE OR REGULAR SODA OR ONE TUBE OF GLUCOSE GEL    STEP 2: RECHECK blood sugar in 15 MINUTES STEP 3: If your blood sugar is still low at the 15 minute recheck --> then, go back to STEP 1 and treat AGAIN with another 15 grams of carbohydrates.

## 2023-02-15 ENCOUNTER — Ambulatory Visit: Payer: Self-pay | Admitting: Licensed Clinical Social Worker

## 2023-02-15 NOTE — Patient Instructions (Signed)
Visit Information  Thank you for taking time to visit with me today. Please don't hesitate to contact me if I can be of assistance to you.   Following are the goals we discussed today:   Goals Addressed             This Visit's Progress    Obtain Supportive Resources-Caregiver Stress   On track    Activities and task to complete in order to accomplish goals.   Keep all upcoming appointments discussed today Continue with compliance of taking medication prescribed by Doctor Implement healthy coping skills discussed to assist with management of symptoms Continue working with [facility] care team to assist with goals identified         Our next appointment is by telephone on 3/7 at 1:30 PM  Please call the care guide team at [phone] if you need to cancel or reschedule your appointment.   If you are experiencing a Mental Health or Behavioral Health Crisis or need someone to talk to, please call the Suicide and Crisis Lifeline: 988 call 911   Patient verbalizes understanding of instructions and care plan provided today and agrees to view in MyChart. Active MyChart status and patient understanding of how to access instructions and care plan via MyChart confirmed with patient.     Windy Fast [facility]  [facility], [facility] Clinical Social Worker Direct Dial: [phone]  Fax: [phone] Website: [email or web address] 1:47 PM

## 2023-02-15 NOTE — Patient Outreach (Signed)
  Care Coordination   Follow Up Visit Note   02/15/2023 Name: [name] [name] MRN: [id number] DOB: [birth year]  [name] is a 68 y.o. year old male who sees [name], [name], PA-C for primary care. I spoke with  Tawny Hopping Rieth's son by phone today.  What matters to the patients health and wellness today?  Symptom Management and Level of Care    Goals Addressed             This Visit's Progress    Obtain Supportive Resources-Caregiver Stress   On track    Activities and task to complete in order to accomplish goals.   Keep all upcoming appointments discussed today Continue with compliance of taking medication prescribed by Doctor Implement healthy coping skills discussed to assist with management of symptoms Continue working with [facility] care team to assist with goals identified         SDOH assessments and interventions completed:  No     Care Coordination Interventions:  Yes, provided  Interventions Today    Flowsheet Row Most Recent Value  Chronic Disease   Chronic disease during today's visit Congestive Heart Failure (CHF), Diabetes, Hypertension (HTN), Other  [MDD]  General Interventions   General Interventions Discussed/Reviewed General Interventions Reviewed, Doctor Visits  Doctor Visits Discussed/Reviewed Doctor Visits Reviewed  Exercise Interventions   Exercise Discussed/Reviewed Exercise Reviewed  [Pt continues to participate in PT twice weekly. RN visits to address wound management]  Mental Health Interventions   Mental Health Discussed/Reviewed Mental Health Reviewed, Coping Strategies, Depression  [Family reports pt feels proud after receiving praise from specialist regarding recent decrease in A1C score. Pt is actively participating in PT/RN [name] services]  Nutrition Interventions   Nutrition Discussed/Reviewed Nutrition Reviewed  Pharmacy Interventions   Pharmacy Dicussed/Reviewed Pharmacy Topics Reviewed, Medication Adherence  [Pt's meds  were adjusted, pt is compliant]  Safety Interventions   Safety Discussed/Reviewed Safety Reviewed  [Son continues to be strong support in maintaining pt's safety living independently]       Follow up plan: Follow up call scheduled for 2 weeks    Encounter Outcome:  Patient Visit Completed   Jenel Lucks, LCSW [place]  [facility], [facility] Clinical Social Worker Direct Dial: [phone]  Fax: [phone] Website: [email or web address] 1:46 PM

## 2023-02-19 ENCOUNTER — Ambulatory Visit: Payer: PPO | Admitting: Physician Assistant

## 2023-02-21 ENCOUNTER — Ambulatory Visit: Payer: PPO | Admitting: Physician Assistant

## 2023-02-25 ENCOUNTER — Ambulatory Visit: Payer: PPO | Admitting: Physician Assistant

## 2023-02-26 ENCOUNTER — Telehealth: Payer: Self-pay

## 2023-02-26 NOTE — Telephone Encounter (Signed)
 Son [name] states he found him this morning, pt states he was fine, called him back a few hours later stating he was having hallucinations; pt son advised he needs to go to ED and patient refused stating even if EMS came he will refuse to go. [name] states pt has appt with PCP on Friday and will keep this appointment. No advise on medications with out proper examination and Pt son verbalized understanding.

## 2023-02-26 NOTE — Telephone Encounter (Signed)
 Copied from CRM [id number]. Topic: Appointments - Appointment Info/Confirmation >> Feb 26, 2023 11:28 AM Truddie Crumble wrote: Patient/patient representative is calling for information regarding an appointment. Patient son called stating the patient had a fall last night around 9pm and the patient son did not find the patient until the next morning. Patient son stated the patient was on th floor for ten hours. Patient son stated there were no injuries to the patient and he is having more hallucinations and it seem to be a product of high blood pressure. The patient was taking spironolactone and losartan and then he was put back on another medication that made him urinate a lot because of fluid buildup. Patient son would like to know if the patient need to go back on the medication losartan and spironolactone CB [phone]  Please see patient's son call note and advise recommendations

## 2023-03-01 ENCOUNTER — Encounter: Payer: Self-pay | Admitting: Physician Assistant

## 2023-03-01 ENCOUNTER — Other Ambulatory Visit: Payer: Self-pay

## 2023-03-01 ENCOUNTER — Encounter (HOSPITAL_BASED_OUTPATIENT_CLINIC_OR_DEPARTMENT_OTHER): Payer: Self-pay | Admitting: Emergency Medicine

## 2023-03-01 ENCOUNTER — Ambulatory Visit (INDEPENDENT_AMBULATORY_CARE_PROVIDER_SITE_OTHER): Payer: PPO

## 2023-03-01 ENCOUNTER — Ambulatory Visit (INDEPENDENT_AMBULATORY_CARE_PROVIDER_SITE_OTHER): Payer: PPO | Admitting: Physician Assistant

## 2023-03-01 ENCOUNTER — Ambulatory Visit (HOSPITAL_COMMUNITY): Admission: RE | Admit: 2023-03-01 | Payer: HMO | Source: Ambulatory Visit

## 2023-03-01 ENCOUNTER — Emergency Department (HOSPITAL_BASED_OUTPATIENT_CLINIC_OR_DEPARTMENT_OTHER)
Admission: EM | Admit: 2023-03-01 | Discharge: 2023-03-01 | Disposition: A | Discharge status: Home / Self Care | Payer: PPO | Attending: Emergency Medicine | Admitting: Emergency Medicine

## 2023-03-01 VITALS — BP 168/80 | HR 61 | Temp 98.1°F | Ht 77.0 in | Wt 169.0 lb

## 2023-03-01 DIAGNOSIS — R441 Visual hallucinations: Secondary | ICD-10-CM

## 2023-03-01 DIAGNOSIS — I509 Heart failure, unspecified: Secondary | ICD-10-CM | POA: Diagnosis not present

## 2023-03-01 DIAGNOSIS — Z7901 Long term (current) use of anticoagulants: Secondary | ICD-10-CM | POA: Diagnosis not present

## 2023-03-01 DIAGNOSIS — R635 Abnormal weight gain: Secondary | ICD-10-CM

## 2023-03-01 DIAGNOSIS — R0902 Hypoxemia: Secondary | ICD-10-CM

## 2023-03-01 DIAGNOSIS — Z79899 Other long term (current) drug therapy: Secondary | ICD-10-CM | POA: Insufficient documentation

## 2023-03-01 DIAGNOSIS — Z951 Presence of aortocoronary bypass graft: Secondary | ICD-10-CM | POA: Diagnosis not present

## 2023-03-01 DIAGNOSIS — J90 Pleural effusion, not elsewhere classified: Secondary | ICD-10-CM | POA: Insufficient documentation

## 2023-03-01 DIAGNOSIS — I5033 Acute on chronic diastolic (congestive) heart failure: Secondary | ICD-10-CM | POA: Diagnosis not present

## 2023-03-01 DIAGNOSIS — R0602 Shortness of breath: Secondary | ICD-10-CM | POA: Diagnosis present

## 2023-03-01 DIAGNOSIS — I2510 Atherosclerotic heart disease of native coronary artery without angina pectoris: Secondary | ICD-10-CM | POA: Diagnosis not present

## 2023-03-01 DIAGNOSIS — W19XXXA Unspecified fall, initial encounter: Secondary | ICD-10-CM | POA: Diagnosis not present

## 2023-03-01 DIAGNOSIS — I10 Essential (primary) hypertension: Secondary | ICD-10-CM

## 2023-03-01 DIAGNOSIS — Z7982 Long term (current) use of aspirin: Secondary | ICD-10-CM | POA: Diagnosis not present

## 2023-03-01 DIAGNOSIS — S91302A Unspecified open wound, left foot, initial encounter: Secondary | ICD-10-CM

## 2023-03-01 LAB — CBC WITH DIFFERENTIAL/PLATELET
Basophils Absolute: 0 10*3/uL (ref 0.0–0.1)
Basophils Relative: 0.7 % (ref 0.0–3.0)
Eosinophils Absolute: 0.2 10*3/uL (ref 0.0–0.7)
Eosinophils Relative: 2.4 % (ref 0.0–5.0)
HCT: 28.4 % — ABNORMAL LOW (ref 39.0–52.0)
Hemoglobin: 9.3 g/dL — ABNORMAL LOW (ref 13.0–17.0)
Lymphocytes Relative: 13.8 % (ref 12.0–46.0)
Lymphs Abs: 0.9 10*3/uL (ref 0.7–4.0)
MCHC: 32.6 g/dL (ref 30.0–36.0)
MCV: 89.8 fL (ref 78.0–100.0)
Monocytes Absolute: 0.4 10*3/uL (ref 0.1–1.0)
Monocytes Relative: 6.6 % (ref 3.0–12.0)
Neutro Abs: 5 10*3/uL (ref 1.4–7.7)
Neutrophils Relative %: 76.5 % (ref 43.0–77.0)
Platelets: 292 10*3/uL (ref 150.0–400.0)
RBC: 3.16 Mil/uL — ABNORMAL LOW (ref 4.22–5.81)
RDW: 15.8 % — ABNORMAL HIGH (ref 11.5–15.5)
WBC: 6.5 10*3/uL (ref 4.0–10.5)

## 2023-03-01 LAB — COMPREHENSIVE METABOLIC PANEL
ALT: 9 U/L (ref 0–53)
AST: 13 U/L (ref 0–37)
Albumin: 3.1 g/dL — ABNORMAL LOW (ref 3.5–5.2)
Alkaline Phosphatase: 157 U/L — ABNORMAL HIGH (ref 39–117)
BUN: 18 mg/dL (ref 6–23)
CO2: 33 meq/L — ABNORMAL HIGH (ref 19–32)
Calcium: 8.2 mg/dL — ABNORMAL LOW (ref 8.4–10.5)
Chloride: 102 meq/L (ref 96–112)
Creatinine, Ser: 0.81 mg/dL (ref 0.40–1.50)
GFR: 89.61 mL/min (ref >60.00)
Glucose, Bld: 190 mg/dL — ABNORMAL HIGH (ref 70–99)
Potassium: 4.1 meq/L (ref 3.5–5.1)
Sodium: 141 meq/L (ref 135–145)
Total Bilirubin: 0.5 mg/dL (ref 0.2–1.2)
Total Protein: 6.2 g/dL (ref 6.0–8.3)

## 2023-03-01 LAB — BRAIN NATRIURETIC PEPTIDE: Pro B Natriuretic peptide (BNP): 399 pg/mL — ABNORMAL HIGH (ref 0.0–100.0)

## 2023-03-01 LAB — AMMONIA: Ammonia: 22 umol/L (ref 11–35)

## 2023-03-01 LAB — TSH: TSH: 2.35 u[IU]/mL (ref 0.35–5.50)

## 2023-03-01 LAB — MAGNESIUM: Magnesium: 1.6 mg/dL (ref 1.5–2.5)

## 2023-03-01 MED ORDER — AMLODIPINE BESYLATE 2.5 MG PO TABS: 2.5000 mg | ORAL_TABLET | Freq: Every day | ORAL | 0 refills | Status: DC

## 2023-03-01 MED ORDER — FUROSEMIDE 20 MG PO TABS: 20.0000 mg | ORAL_TABLET | Freq: Every day | ORAL | 0 refills | Status: DC

## 2023-03-01 MED ORDER — FUROSEMIDE 10 MG/ML IJ SOLN
40.0000 mg | INTRAMUSCULAR | Status: AC
  Administered 2023-03-01: 40 mg via INTRAVENOUS
  Filled 2023-03-01: qty 4

## 2023-03-01 NOTE — ED Triage Notes (Signed)
 Seen at PCP for f/o for recent CHF hospitalization. Had CXR that showed pleural effusion. Sats low 90s.

## 2023-03-01 NOTE — ED Provider Notes (Signed)
 [facility] EMERGENCY DEPARTMENT AT [facility] Provider Note   CSN: [id number] Arrival date & time: 03/01/23  1523     History  Chief Complaint  Patient presents with   Shortness of Breath    [name] [name] is a 68 y.o. male.  68 year old male with a history of CAD status post CABG and heart failure preserved ejection fraction who presents emergency department weight gain and shortness of breath.  History obtained per the patient and his son.  They report that over the past 2 weeks he has gained approximately 14 pounds.  Was taken off of his Lasix in December and then restarted on it earlier in the month.  Currently takes 20 mg every 3 days.  Has had increased leg swelling.  Also has had increased shortness of breath.  Had a cold this weekend but those symptoms resolved.  Saw his primary doctor today who performed a CBC, CMP, magnesium, BNP, and chest x-ray.  It showed that his creatinine and potassium are within normal limits.  BNP was 399.  Chest x-ray showed moderate-sized bilateral pleural effusions with associated atelectasis.  They referred him to the emergency department for additional evaluation.       Home Medications Prior to Admission medications   Medication Sig Start Date End Date Taking? Authorizing Provider  acetaminophen (TYLENOL) 500 MG tablet Take 500 mg by mouth as needed for mild pain or moderate pain.    [provider]  amLODipine (NORVASC) 2.5 MG tablet Take 1 tablet (2.5 mg total) by mouth daily. 03/01/23   [name], [name], PA-C  apixaban (ELIQUIS) 5 MG TABS tablet Take 1 tablet (5 mg total) by mouth 2 (two) times daily. 03/02/22   Sande Rives, MD  aspirin EC 81 MG EC tablet Take 1 tablet (81 mg total) by mouth daily. Patient taking differently: Take 81 mg by mouth in the morning. 02/18/21   Zannie Cove, MD  atorvastatin (LIPITOR) 40 MG tablet TAKE 1 TABLET BY MOUTH EVERY DAY Patient taking differently: Take 40 mg by mouth  at bedtime. 09/17/22   [name], [name], PA-C  Cholecalciferol 25 MCG (1000 UT) capsule Take 1,000 Units by mouth daily.    [provider]  Continuous Glucose Sensor (DEXCOM G7 SENSOR) MISC 1 Device by Does not apply route as directed. 02/14/23   Shamleffer, Konrad Dolores, MD  furosemide (LASIX) 20 MG tablet Take 1 tablet (20 mg total) by mouth daily. 03/01/23 02/29/24  [name], [name], PA-C  glipiZIDE (GLUCOTROL) 10 MG tablet Take 1 tablet (10 mg total) by mouth daily before breakfast. 02/14/23   Shamleffer, Konrad Dolores, MD  loperamide (IMODIUM) 2 MG capsule Take 1 capsule (2 mg total) by mouth 4 (four) times daily as needed for diarrhea or loose stools. 01/01/23   Jeani Sow, MD  mirtazapine (REMERON) 15 MG tablet TAKE 1 TABLET BY MOUTH EVERYDAY AT BEDTIME 04/19/22   [name], [name], PA-C  Multiple Vitamins-Minerals (MULTIVITAMIN WITH MINERALS) tablet Take 1 tablet by mouth daily.    [provider]  PARoxetine (PAXIL) 20 MG tablet TAKE 1 TABLET ONCE A DAY FOR DEPRESSION 01/22/23   [name], [name], PA-C  QUEtiapine (SEROQUEL) 25 MG tablet GIVE 1 TABLET BY MOUTH AT BEDTIME FOR DELIRIUM 01/22/23   [name], [name], PA-C  sitaGLIPtin (JANUVIA) 100 MG tablet Take 1 tablet (100 mg total) by mouth daily. 02/14/23   Shamleffer, Konrad Dolores, MD      Allergies    Wool alcohol [  lanolin]    Review of Systems   Review of Systems  Physical Exam Updated Vital Signs BP (!) 188/91   Pulse (!) 52   Temp 98.1 F (36.7 C) (Oral)   Resp 18   SpO2 98%  Physical Exam Vitals and nursing note reviewed.  Constitutional:      General: He is not in acute distress.    Appearance: He is well-developed.  HENT:     Head: Normocephalic and atraumatic.     Right Ear: External ear normal.     Left Ear: External ear normal.     Nose: Nose normal.  Eyes:     Extraocular Movements: Extraocular movements intact.     Conjunctiva/sclera: Conjunctivae normal.     Pupils:  Pupils are equal, round, and reactive to light.  Cardiovascular:     Rate and Rhythm: Normal rate and regular rhythm.     Heart sounds: Normal heart sounds.  Pulmonary:     Effort: Pulmonary effort is normal. No respiratory distress.     Comments: Breath sounds diminished bibasilarly Musculoskeletal:     Cervical back: Normal range of motion and neck supple.     Right lower leg: Edema (1+) present.     Left lower leg: Edema (1+) present.  Skin:    General: Skin is warm and dry.  Neurological:     Mental Status: He is alert. Mental status is at baseline.  Psychiatric:        Mood and Affect: Mood normal.        Behavior: Behavior normal.     ED Results / Procedures / Treatments   Labs (all labs ordered are listed, but only abnormal results are displayed) Labs Reviewed - No data to display  EKG None  Radiology No results found.   Procedures Procedures    Medications Ordered in ED Medications  furosemide (LASIX) injection 40 mg (40 mg Intravenous Given 03/01/23 1722)    ED Course/ Medical Decision Making/ A&P                                 Medical Decision Making Risk Prescription drug management.   [name] [name] is a 68 y.o. male with comorbidities that complicate the patient evaluation including CAD status post CABG and heart failure preserved ejection fraction who presents emergency department weight gain and shortness of breath.     Initial Ddx:  CHF, pleural effusion, volume overload, renal failure, liver failure/hypoalbuminemia  MDM/Course:  Patient presents emergency department with shortness of breath and leg swelling.  He was referred in from his outpatient provider because he had a chest x-ray that showed moderate pleural effusions.  Lab work was otherwise unremarkable aside from a BNP of 399.  On exam he satting well on room air.  Does not appear to be in respiratory distress.  Does have some lower extremity edema with Minnis breath sounds  bilaterally which I suspect is from his effusions.  I did review his x-ray as well.  It appears that he has only been taking Lasix 3 times a week.  Since he is overall well-appearing and I feel that we should trial him on diuresis was given a dose of IV Lasix.  Will have him go home on Lasix daily as well and follow-up with his primary doctor and cardiologist but do not feel that he needs admission for diuresis at this time.  If his pleural effusion  persists may benefit from thoracentesis but again I think we should try diuresis before jumping to the assistance is more invasive.  On reevaluation patient was stable.  This patient presents to the ED for concern of complaints listed in HPI, this involves an extensive number of treatment options, and is a complaint that carries with it a high risk of complications and morbidity. Disposition including potential need for admission considered.   Dispo: DC Home. Return precautions discussed including, but not limited to, those listed in the AVS. Allowed pt time to ask questions which were answered fully prior to dc.  Additional history obtained from son Records reviewed Outpatient Clinic Notes The following labs were independently interpreted: Chemistry and show no acute abnormality I independently reviewed the following imaging with scope of interpretation limited to determining acute life threatening conditions related to emergency care: Chest x-ray and agree with the radiologist interpretation with the following exceptions: none I have reviewed the patients home medications and made adjustments as needed Social Determinants of health:  Elderly  Portions of this note were generated with Scientist, clinical (histocompatibility and immunogenetics). Dictation errors may occur despite best attempts at proofreading.     Final Clinical Impression(s) / ED Diagnoses Final diagnoses:  Acute on chronic congestive heart failure, unspecified heart failure type (HCC)  Pleural effusion    Rx /  DC Orders ED Discharge Orders          Ordered    Ambulatory referral to Cardiology        03/01/23 1852              Rondel Baton, MD 03/03/23 1325

## 2023-03-01 NOTE — Discharge Instructions (Addendum)
 You were seen for your increased weight and fluid on your lungs in the emergency department.   At home, please take the Lasix you are prescribed daily.  This will help get the fluid off of your legs and lungs.  Check your MyChart online for the results of any tests that had not resulted by the time you left the emergency department.   Follow-up with cardiology or your primary doctor in 1 week.  Return immediately to the emergency department if you experience any of the following: Difficulty breathing, or any other concerning symptoms.    Thank you for visiting our Emergency Department. It was a pleasure taking care of you today.

## 2023-03-01 NOTE — Progress Notes (Signed)
 Patient ID: [name] [name], male    DOB: Aug 29, 1955, 68 y.o.   MRN: [id number]   Assessment & Plan:  Essential hypertension -     amLODIPine Besylate; Take 1 tablet (2.5 mg total) by mouth daily.  Dispense: 30 tablet; Refill: 0  Acute on chronic heart failure with preserved ejection fraction (HFpEF) (HCC) -     Furosemide; Take 1 tablet (20 mg total) by mouth daily.  Dispense: 30 tablet; Refill: 0 -     DG Chest 2 View; Future -     CBC with Differential/Platelet -     Comprehensive metabolic panel -     TSH -     Magnesium -     Brain natriuretic peptide -     Ammonia  Hypoxia -     DG Chest 2 View; Future -     CBC with Differential/Platelet -     Comprehensive metabolic panel -     TSH -     Magnesium -     Brain natriuretic peptide -     Ammonia  Fall, initial encounter -     DG Chest 2 View; Future -     CBC with Differential/Platelet -     Comprehensive metabolic panel -     TSH -     Magnesium -     Brain natriuretic peptide -     Ammonia  Abnormal weight gain -     DG Chest 2 View; Future -     CBC with Differential/Platelet -     Comprehensive metabolic panel -     TSH -     Magnesium -     Brain natriuretic peptide -     Ammonia  Visual hallucinations -     CBC with Differential/Platelet -     Comprehensive metabolic panel -     TSH -     Magnesium -     Brain natriuretic peptide -     Ammonia  Wound of left foot   *Overall recommendation was to present to the ER for evaluation due to his hypoxia noted today and 14 lb weight gain, but patient adamantly refused to present to ER at this time.     Congestive Heart Failure Rapid weight gain of 14 pounds over two weeks, likely due to fluid retention. No increased shortness of breath or chest pain reported. -Start Lasix 20mg  daily to manage fluid retention. -Order stat labs including BNP, CBC, CMAT, magnesium, TSH. -Order stat chest x-ray to assess for fluid overload. -Follow up in one week or  sooner if lab results or chest x-ray are concerning.  Hypertension Elevated, currently not taking any BP medication -Start amlodipine 2.5mg  daily for blood pressure control.  Hallucinations Baseline - has followed with neurology; patient is A&Ox3 today, quite lucid at the moment -Monitor closely for any changes in neurological status.  Poor Vision Reports of worsening vision, with previous eye treatments not providing significant improvement. -Refer to Ophthalmology for further evaluation and management.  Skin Integrity Reports of a healing bed sore and a foot wound currently being treated by home health nurses. -Encourage patient to allow nurses to assess and treat all wounds.  General Health Maintenance -Plan for colonoscopy as scheduled -Consider application for a life alert system for safety following recent fall. -Encourage daily showers for improved hygiene and skin health.        Return in about 1 week (around 03/08/2023) for recheck/follow-up.  Subjective:    Chief Complaint  Patient presents with   Hypertension    Son states he had fall on Monday night. States [facility] PT comes in and nurse once to week to check vitals. Pt states last bp with Nurse at home was 137/92. Son stated still having Hallucinations at times.    HPI Discussed the use of AI scribe software for clinical note transcription with the patient, who gave verbal consent to proceed.  History of Present Illness   [name] [name] is a 68 year old male with congestive heart failure who presents with fluid retention and weight gain. He is accompanied by his son, [name] [name], who is his primary caregiver.  He has gained 14 pounds since his endocrinology appointment two weeks ago. He maintains a strong appetite, consuming foods like sausage biscuits, breakfast bowls, and tomato soup. He was previously taken off furosemide in late 2024 but resumed it occasionally after a hospital visit in late January to  early February. He currently takes furosemide every other day or every three days, as specific instructions were not provided per son.  He has a history of congestive heart failure and reports no increased swelling recently. His oxygen levels are lower than usual today, but no increased shortness of breath or chest pain. No significant changes in breathing or swelling have been noted.  He experiences hallucinations, which he associates with high blood pressure or missed medications. He had serious hallucinations on Monday, followed by a fall that night. His blood pressure was recorded at 134/78 by a nurse on Wednesday, and the hallucinations had stopped. However, he mentioned experiencing them again today. His vision has worsened, and he previously received eye shots that improved his vision.  He says the fall happened while placing items on his night table, resulting in him falling backward. He did not hit his head and is unsure if he passed out. No lingering effects such as headaches or neck pain. He is concerned about his ability to shower independently due to mobility issues and prefers sponge baths when his son is not present.  He has a foot wound being treated by nurses, but he is not allowing them to assess a buttock bed sore, which he claims is almost healed. He is hesitant to have others assist with personal care, such as showering, due to modesty and prefers to manage independently when possible. He has overall poor hygiene.  No increased shortness of breath, chest pain, or changes in urination. He reports feeling off recently, "possibly the flu", and did not receive a flu shot this year. He has not had a bowel movement today. Scheduled for f/up with GI next month. Denies any bleeding issues. Denies any urinary issues. Says his urine is yellow in color.    Past Medical History:  Diagnosis Date   Anxiety    Arthritis    Cataract    CHF (congestive heart failure) (HCC)    Constipation     pt states goes EOD- hard stools    Coronary artery disease    Depression    Diabetes mellitus without complication (HCC)    Diabetic retinopathy (HCC)    DKA (diabetic ketoacidoses)    Hyperlipidemia    Hypertension    off meds   Hypertensive retinopathy    Non-intractable vomiting without nausea 06/10/2018   Postherpetic neuralgia 08/11/2021   Speculative diagnosis   Psoriasis    Tubular adenoma of colon 03/2019    Past Surgical History:  Procedure Laterality Date  ABDOMINAL AORTOGRAM W/LOWER EXTREMITY N/A 08/30/2020   Procedure: ABDOMINAL AORTOGRAM W/LOWER EXTREMITY;  Surgeon: Nada Libman, MD;  Location: [facility] INVASIVE CV LAB;  Service: Cardiovascular;  Laterality: N/A;   ABDOMINAL AORTOGRAM W/LOWER EXTREMITY N/A 11/15/2020   Procedure: ABDOMINAL AORTOGRAM W/LOWER EXTREMITY;  Surgeon: Nada Libman, MD;  Location: [facility]VAS[facility] CV [facility];  Service: Cardiovascular;  Laterality: N/A;   ANKLE SURGERY     right   CARDIAC CATHETERIZATION  09/25/2001   COLONOSCOPY     ~10 yr ago- normal  per pt    CORONARY ARTERY BYPASS GRAFT  09/30/2001   CABG x 4   IR THORACENTESIS ASP PLEURAL SPACE W/IMG GUIDE  05/19/2020   IR THORACENTESIS ASP PLEURAL SPACE W/IMG GUIDE  02/04/2023   LAPAROSCOPIC INGUINAL HERNIA REPAIR Bilateral 02/09/2010   MASS EXCISION Left 03/12/2013   Procedure: LEFT INDEX EXCISION MASS AND DEBRIDMENT DISTAL INTERPHALANGEAL JOINT;  Surgeon: Tami Ribas, MD;  Location: [facility];  Service: Orthopedics;  Laterality: Left;   PERIPHERAL VASCULAR BALLOON ANGIOPLASTY Left 08/30/2020   Procedure: PERIPHERAL VASCULAR BALLOON ANGIOPLASTY;  Surgeon: Nada Libman, MD;  Location: [facility];  Service: Cardiovascular;  Laterality: Left;  Peroneal   TRANSESOPHAGEAL ECHOCARDIOGRAM (CATH LAB) N/A 11/26/2022   Procedure: TRANSESOPHAGEAL ECHOCARDIOGRAM;  Surgeon: [name], MD;  [facility];  Service: Cardiovascular;  Laterality: N/A;     Family History  Problem Relation Age of Onset   Lymphoma Father    Cancer Paternal Grandmother        Colon Cancer   Colon cancer Paternal Grandmother    Colon polyps Neg Hx    Esophageal cancer Neg Hx    Rectal cancer Neg Hx    Stomach cancer Neg Hx    Diabetes Mellitus I Neg Hx    Pancreatic cancer Neg Hx     Social History   Tobacco Use   Smoking status: Some Days    Types: Cigars    Last attempt to quit: 2022    Years since quitting: 3.1   Smokeless tobacco: Never  Vaping Use   Vaping status: Never Used  Substance Use Topics   Alcohol use: Yes    Comment: about once a year   Drug use: No     Allergies  Allergen Reactions   Wool Alcohol [Lanolin] Hives and Itching    Wool fabric    Review of Systems NEGATIVE UNLESS OTHERWISE INDICATED IN HPI      Objective:     BP (!) 168/80   Pulse 61   Temp 98.1 F (36.7 C) (Temporal)   Ht 6\' 5"  (1.956 m)   Wt 169 lb (76.7 kg)   SpO2 90%   BMI 20.04 kg/m   Wt Readings from Last 3 Encounters:  03/01/23 169 lb (76.7 kg)  02/14/23 155 lb (70.3 kg)  02/07/23 150 lb 9.2 oz (68.3 kg)    BP Readings from Last 3 Encounters:  03/01/23 (!) 168/80  02/14/23 116/72  02/07/23 117/69     Physical Exam Vitals and nursing note reviewed.  Constitutional:      General: He is not in acute distress.    Appearance: Normal appearance. He is not ill-appearing.     Comments: Wearing winter gloves  Using rollator walker, unsteady in general  Poor hygiene overall   HENT:     Right Ear: Tympanic membrane normal.     Left Ear: Tympanic membrane normal.     Mouth/Throat:  Mouth: Mucous membranes are dry.  Eyes:     Extraocular Movements: Extraocular movements intact.     Conjunctiva/sclera: Conjunctivae normal.     Pupils: Pupils are equal, round, and reactive to light.  Cardiovascular:     Rate and Rhythm: Normal rate and regular rhythm.     Heart sounds: No murmur heard. Pulmonary:     Effort: Pulmonary  effort is normal.     Breath sounds: Normal breath sounds. No wheezing.  Musculoskeletal:     Right lower leg: No edema.     Left lower leg: No edema.  Skin:    General: Skin is dry.     Findings: No lesion.  Neurological:     Mental Status: He is alert and oriented to person, place, and time.     Cranial Nerves: No cranial nerve deficit.     Motor: Weakness (generalized, unsteady) present.  Psychiatric:        Mood and Affect: Mood is not depressed. Affect is flat.     Comments: baseline           Time Spent: 54 minutes of total time was spent on the date of the encounter performing the following actions: chart review prior to seeing the patient, obtaining history, performing a medically necessary exam, counseling on the treatment plan, placing orders, and documenting in our EHR.       Carlisle Enke M Harlei Lehrmann, PA-C

## 2023-03-04 ENCOUNTER — Ambulatory Visit: Payer: PPO | Admitting: Gastroenterology

## 2023-03-04 ENCOUNTER — Ambulatory Visit: Payer: PPO | Admitting: Physician Assistant

## 2023-03-04 NOTE — Telephone Encounter (Signed)
Please see patient update.

## 2023-03-08 ENCOUNTER — Telehealth: Payer: Self-pay

## 2023-03-08 ENCOUNTER — Ambulatory Visit: Payer: Self-pay | Admitting: Licensed Clinical Social Worker

## 2023-03-08 NOTE — Telephone Encounter (Signed)
 Copied from CRM [id number]. Topic: Clinical - Medical Advice >> Mar 08, 2023 11:59 AM Melissa C wrote: Reason for CRM: patient's Home Health Physical Therapist calling to report high BP of 180/90 and wondering if there is something that he can take for high blood pressure. Patient has already taken regular regiment of medications that includes something for blood pressure. Callback number  [phone]  Please see call msg from Home health and advise on pt BP and medication.

## 2023-03-11 ENCOUNTER — Ambulatory Visit: Payer: PPO | Admitting: Physician Assistant

## 2023-03-11 NOTE — Telephone Encounter (Signed)
 Noted.

## 2023-03-12 ENCOUNTER — Telehealth: Payer: Self-pay

## 2023-03-12 NOTE — Telephone Encounter (Signed)
 Copied from CRM [id number]. Topic: General - Other >> Mar 12, 2023  9:42 AM Alcus Dad wrote: Reason for CRM: Kristi from [facility] stated that patient BP reading is 162/82, heart rate is  60 and tempeture is 99.9. Call Silva Bandy for more info [phone] or call son [name] [phone]  Please see call msg from [facility] nurse [facility])

## 2023-03-12 NOTE — Telephone Encounter (Signed)
 Noted.

## 2023-03-12 NOTE — Patient Instructions (Signed)
 Visit Information  Thank you for taking time to visit with me today. Please don't hesitate to contact me if I can be of assistance to you.   Following are the goals we discussed today:   Goals Addressed             This Visit's Progress    Obtain Supportive Resources-Caregiver Stress   On track    Activities and task to complete in order to accomplish goals.   Keep all upcoming appointments discussed today Continue with compliance of taking medication prescribed by Doctor Implement healthy coping skills discussed to assist with management of symptoms Continue working with [facility] care team to assist with goals identified         Our next appointment is by telephone on 3/14 at 3 PM  Please call the care guide team at [phone] if you need to cancel or reschedule your appointment.   If you are experiencing a Mental Health or Behavioral Health Crisis or need someone to talk to, please call the Suicide and Crisis Lifeline: 988 call 911   Patient verbalizes understanding of instructions and care plan provided today and agrees to view in MyChart. Active MyChart status and patient understanding of how to access instructions and care plan via MyChart confirmed with patient.     Windy Fast [facility]  [facility], [facility] Clinical Social Worker Direct Dial: [phone]  Fax: [phone] Website: [email or web address] 12:17 PM

## 2023-03-12 NOTE — Patient Outreach (Signed)
 Care Coordination   Follow Up Visit Note   03/08/2023 Name: [name] [name] MRN: [id number] DOB: 1955/03/24  [name] [name] is a 68 y.o. year old male who sees [name], [name], PA-C for primary care. I spoke with  [name] [name]'s  son by phone today.  What matters to the patients health and wellness today?  Symptom Management and Level of Care    Goals Addressed             This Visit's Progress    Obtain Supportive Resources-Caregiver Stress   On track    Activities and task to complete in order to accomplish goals.   Keep all upcoming appointments discussed today Continue with compliance of taking medication prescribed by Doctor Implement healthy coping skills discussed to assist with management of symptoms Continue working with [name] care team to assist with goals identified         SDOH assessments and interventions completed:  No     Care Coordination Interventions:  Yes, provided  Interventions Today    Flowsheet Row Most Recent Value  Chronic Disease   Chronic disease during today's visit Congestive Heart Failure (CHF), Diabetes, Hypertension (HTN), Other  [MDD]  General Interventions   General Interventions Discussed/Reviewed General Interventions Reviewed, Doctor Visits, Level of Care  [LCSW discussed assignment changes within CCM in the next 1-2 months]  Doctor Visits Discussed/Reviewed Doctor Visits Reviewed  Level of Care --  [Patient continues to have difficulty managing chronic health conditions and ADLs. Discussed support services such as HH]  Mental Health Interventions   Mental Health Discussed/Reviewed Mental Health Reviewed  Pharmacy Interventions   Pharmacy Dicussed/Reviewed Pharmacy Topics Reviewed, Medication Adherence  [Med adjustments were made to assist with management chronic health conditions. No barriers identified with obtaining meds]  Safety Interventions   Safety Discussed/Reviewed Safety Reviewed  [Pt's son continues to  check in on patient and monitor him daily]       Follow up plan: Follow up call scheduled for 2 weeks    Encounter Outcome:  Patient Visit Completed   Jenel Lucks, LCSW   [facility], [facility] Clinical Social Worker Direct Dial: [phone]  Fax: [phone] Website: [email or web address] 12:17 PM

## 2023-03-13 ENCOUNTER — Other Ambulatory Visit: Payer: Self-pay | Admitting: Physician Assistant

## 2023-03-13 NOTE — Progress Notes (Unsigned)
 Cardiology Office Note:  .   Date:  03/15/2023  ID:  [name] [name], DOB October 06, 1955, MRN [id number] PCP: Bary Leriche, PA-C  [facility] Providers Cardiologist:  Reatha Harps, MD    History of Present Illness: .    Chief Complaint  Patient presents with   Shortness of Breath         [name] [name] is a 68 y.o. male with history of PAD, CAD, DM, HFPEF, HTN who presents for follow-up. Has had recurrent pleural effusions with thoracentesis.   History of Present Illness   [name] [name] is a 68 year old male with CAD s/p CABG, PAD, DM, permanent Afib, HTN, HLD recurrent pleural effusions who presents for follow-up. He is accompanied by his oldest son.  He has been experiencing recurrent pleural effusions. Despite elevated weight and B-type natriuretic peptide (BNP) levels, he has not sought emergency care. He was previously on furosemide (Lasix), spironolactone, and another medication he cannot recall. After a hospitalization and rehabilitation in November and December, Lasix was discontinued. In late January, Lasix was reintroduced every three days, and following a short hospital visit two weeks ago, he began taking Lasix daily. No recent pneumonia or CT scans of the chest.  He has a history of diastolic heart failure with recent hospitalization. He is currently on furosemide 20 mg daily. His breathing is generally good, though it occasionally feels tight, which he associates with high blood pressure. He remains active with the help of nurses and physical therapists who visit his apartment.  He has a history of hypertension and reports elevated blood pressure today. He was previously on spironolactone and losartan, which were discontinued due to renal concerns. He started amlodipine within the last two weeks and is currently taking 2.5 mg daily.  He has a history of coronary artery disease (CAD) status post coronary artery bypass grafting (CABG) and is on  Lipitor 40 mg. He is also on Eliquis for persistent atrial fibrillation with a slow ventricular response. No symptoms of angina, dizziness, or lightheadedness. He uses a cardio mobile device to monitor his heart rhythm.  He has a history of peripheral artery disease (PAD) with previous balloon angioplasty to the right posterior tibial and left peroneal arteries. No current symptoms of PAD.  He is diabetic with a recent A1c of 7.8, which has improved according to his endocrinologist.  He is also chronically anemic, which will need evaluation by his primary care physician. No chest pain, fevers, chills, cough, or congestion.          Problem List PAD -R PT stenosis -> balloon 11/2020 -L peroneal artery -> balloon/recurrent stenosis 11/2020 2. CAD s/p CABG -09/30/2001 CABG x 4 3. Persistent Afib  4. DM -A1c 7.8 5. HTN 6. HFpEF 7. HLD -T chol 78, HDL 39, LDL 34, TG 26    ROS: All other ROS reviewed and negative. Pertinent positives noted in the HPI.     Studies Reviewed: Marland Kitchen   EKG Interpretation Date/Time:  Friday March 15 2023 13:48:49 EDT Ventricular Rate:  61 PR Interval:    QRS Duration:  90 QT Interval:  498 QTC Calculation: 501 R Axis:   134  Text Interpretation: Atrial fibrillation Left posterior fascicular block Nonspecific T wave abnormality Prolonged QT Confirmed by Lennie Odor [id number] on 03/15/2023 1:52:38 PM   TEE 11/26/2022  1. Left ventricular ejection fraction, by estimation, is 60 to 65%. The  left ventricle has normal function.   2. Right ventricular  systolic function is mildly reduced. The right  ventricular size is normal.   3. Left atrial size was mildly dilated. No left atrial/left atrial  appendage thrombus was detected.   4. The mitral valve is normal in structure. Trivial mitral valve  regurgitation.   5. Aortic dilatation noted. There is mild dilatation of the aortic root,  measuring 40 mm.   6. The aortic valve is tricuspid. There is moderate  thickening of the  aortic valve. Aortic valve regurgitation is not visualized. Aortic valve  sclerosis/calcification is present, without any evidence of aortic  stenosis. Thickened aortic valve leaflets,  but no vegetation seen   TTE 11/21/2022  1. Left ventricular ejection fraction, by estimation, is 55 to 60%. The  left ventricle has normal function. Left ventricular endocardial border  not optimally defined to evaluate regional wall motion. There is mild  concentric left ventricular  hypertrophy. Left ventricular diastolic parameters are indeterminate.   2. Right ventricular systolic function is mildly reduced. The right  ventricular size is mildly enlarged. There is normal pulmonary artery  systolic pressure. The estimated right ventricular systolic pressure is  32.6 mmHg.   3. Left atrial size was mildly dilated.   4. Right atrial size was mildly dilated.   5. The mitral valve is normal in structure. Trivial mitral valve  regurgitation. No evidence of mitral stenosis.   6. The aortic valve is tricuspid. 1.4 x 1 cm nodule on the noncoronary  cusp, concern for vegetation and would suggest TEE evaluation. Aortic  valve regurgitation is not visualized. No aortic stenosis is present.   7. Aortic dilatation noted. There is mild dilatation of the ascending  aorta, measuring 38 mm.   8. The inferior vena cava is normal in size with <50% respiratory  variability, suggesting right atrial pressure of 8 mmHg.   9. Non-sinus rhythm, ?atrial fibrillation  Physical Exam:   VS:  BP (!) 158/70   Pulse 61   Ht 6\' 2"  (1.88 m)   Wt 168 lb (76.2 kg)   SpO2 94%   BMI 21.57 kg/m    Wt Readings from Last 3 Encounters:  03/15/23 168 lb (76.2 kg)  03/14/23 167 lb 3.2 oz (75.8 kg)  03/01/23 169 lb (76.7 kg)    GEN: Well nourished, well developed in no acute distress NECK: No JVD; No carotid bruits CARDIAC: RRR, no murmurs, rubs, gallops RESPIRATORY:  Clear to auscultation without rales,  wheezing or rhonchi  ABDOMEN: Soft, non-tender, non-distended EXTREMITIES:  No edema; No deformity  ASSESSMENT AND PLAN: .   Assessment and Plan    Recurrent pleural effusions Recurrent pleural effusions with unclear etiology. CHF unlikely. Possible fluid trapping or lung scarring suspected. - Order chest CT without contrast. - Reevaluate heart function with echocardiogram.  Diastolic heart failure Diastolic heart failure with recent hospitalization and elevated BNP. Euvolemic today. Reevaluation of heart function and medication adjustment planned. - Order echocardiogram. - Continue furosemide 20 mg daily. - Restart spironolactone 25 mg daily.  Hypertension Blood pressure elevated at 158/70 mmHg. Amlodipine started, spironolactone added. Target BP <130/80 mmHg. - Continue amlodipine 2.5 mg daily. - Restart spironolactone 25 mg daily. - Aim for BP <130/80 mmHg.  Coronary artery disease (CAD) status post coronary artery bypass grafting (CABG) CAD status post CABG. No angina. On Eliquis for atrial fibrillation. Aspirin discontinued to reduce bleeding risk. - Discontinue aspirin.  Persistent atrial fibrillation Persistent atrial fibrillation with slow ventricular response. On Eliquis. No AV nodal agents needed. - Continue Eliquis.  Peripheral artery disease (PAD) PAD with previous balloon angioplasty. No current symptoms.  Hyperlipidemia LDL 52 mg/dL. On Lipitor. No changes needed. - Continue Lipitor 40 mg daily.  Diabetes mellitus A1c 7.8% in January. Managed with primary care physician.  Chronic anemia Chronically anemic. Requires evaluation by primary care physician. - Refer to primary care physician for evaluation.  Follow-up Follow-up appointments to monitor conditions and adjust treatment as necessary. - Follow up with PA in 3 months. - Follow up with cardiologist in 6 months. - Schedule tests and follow up through MyChart.              Follow-up: Return in  about 3 months (around 06/15/2023).  Signed, Lenna Gilford. Flora Lipps, MD, [facility]  [facility]  [address] [place], [place] [address] [phone]  2:19 PM

## 2023-03-14 ENCOUNTER — Ambulatory Visit (INDEPENDENT_AMBULATORY_CARE_PROVIDER_SITE_OTHER): Payer: PPO | Admitting: Physician Assistant

## 2023-03-14 ENCOUNTER — Other Ambulatory Visit (HOSPITAL_BASED_OUTPATIENT_CLINIC_OR_DEPARTMENT_OTHER): Payer: Self-pay | Admitting: Cardiovascular Disease

## 2023-03-14 ENCOUNTER — Encounter: Payer: Self-pay | Admitting: Physician Assistant

## 2023-03-14 VITALS — BP 166/80 | HR 65 | Temp 97.2°F | Ht 77.0 in | Wt 167.2 lb

## 2023-03-14 DIAGNOSIS — I5033 Acute on chronic diastolic (congestive) heart failure: Secondary | ICD-10-CM

## 2023-03-14 DIAGNOSIS — R443 Hallucinations, unspecified: Secondary | ICD-10-CM

## 2023-03-14 DIAGNOSIS — E1165 Type 2 diabetes mellitus with hyperglycemia: Secondary | ICD-10-CM

## 2023-03-14 DIAGNOSIS — R441 Visual hallucinations: Secondary | ICD-10-CM

## 2023-03-14 DIAGNOSIS — S91302A Unspecified open wound, left foot, initial encounter: Secondary | ICD-10-CM

## 2023-03-14 DIAGNOSIS — I739 Peripheral vascular disease, unspecified: Secondary | ICD-10-CM

## 2023-03-14 NOTE — Progress Notes (Signed)
 Patient ID: [name] [name], male    DOB: 07-Jan-1955, 68 y.o.   MRN: [id number]   Assessment & Plan:  Acute on chronic heart failure with preserved ejection fraction (HFpEF) (HCC)  Visual hallucinations  Wound of left foot  Hallucinations  Uncontrolled type 2 diabetes mellitus with hyperglycemia (HCC)       Congestive Heart Failure Ejection fraction 60-65% per last ECHO. Cardiologist follow-up scheduled for fluid management and medication adjustments. - Follow up with cardiologist for heart failure management. - Discuss fluid management and medication adjustments with cardiologist.  Hypertension Occasional elevated blood pressure. Cardiologist to manage in context of heart failure. - Defer blood pressure management to cardiologist. - Monitor blood pressure regularly. - Discuss blood pressure management with cardiologist.  Type 2 Diabetes Mellitus Fluctuating blood glucose levels. Continuous glucose monitoring needed. A1c improved with current endocrinologist. Lab Results  Component Value Date   HGBA1C 7.8 (H) 02/03/2023   HGBA1C 9.2 (H) 11/30/2022   HGBA1C 9.8 (A) 11/13/2022    - Message endocrinologist to address continuous glucose monitoring device approval. - Monitor blood glucose levels regularly. - Continue current diabetes management plan.  Vision Issues Decline in vision potentially contributing to hallucinations. Ophthalmology appointment scheduled. - Follow up with ophthalmologist for vision assessment.  Mental Health Improved with Seroquel 25 mg, aiding sleep and reducing hallucinations. No recent hallucinations. - Continue Seroquel for sleep and hallucination management. - Monitor for any recurrence of hallucinations.      Left foot wound -Appears to be healing well, no signs of infection today -Redressed wound while here -To be monitored by home health nurses, will call if concerning changes   Return in about 6 weeks (around 04/25/2023) for  recheck/follow-up.    Subjective:    Chief Complaint  Patient presents with   Medical Management of Chronic Issues    Pt in office to follow up with PCP regarding chronic conditions; pt says the past week things have been going well; Home health concerned regarding high BP readings; eye sight also declining per son; Dexcom 7 discussion;     HPI Discussed the use of AI scribe software for clinical note transcription with the patient, who gave verbal consent to proceed.  History of Present Illness   He is a 68 year old with congestive heart failure, hypertension, and type 2 diabetes who presents for follow-up on his chronic conditions. Here with his son.  He has been experiencing fluctuations in blood pressure, with some days being high but generally coming down. He is concerned about managing spikes in blood pressure and is unsure of immediate remedies. He does not consume caffeine and understands that his blood pressure can be influenced by activity and fluid status. He has an appointment with his cardiologist tomorrow to discuss heart failure and fluid management.  He has a history of congestive heart failure and is aware of the importance of managing fluid balance to prevent fluid overload and shortness of breath. His ejection fraction was reported to be 60-65% from an echocardiogram done in February 2025.  He is managing type 2 diabetes and is in contact with his endocrinologist regarding continuous glucose monitoring. He is currently facing an issue with insurance approval for the G7 continuous glucose monitoring system.  He has vision issues and has not seen an eye doctor since early November, prior to a series of hospitalizations. He has an appointment with an eye specialist tomorrow. He notes that his vision has deteriorated over the past three months, which  he believes may contribute to hallucinations. No recent hallucinations reported.  He has a wound on his left foot that he  cannot see himself and relies on home health care for wound management. There was a recent issue with a nurse not showing up for a scheduled visit, but he has not experienced any pain or issues with the wound recently. He also mentions a sore on his buttock that has been managed with medication provided by a nurse, with no recent issues reported (he refuses to let anyone look at this).  He is currently taking Seroquel at night, which helps him sleep and manage hallucinations. He reports having good dreams and a good week overall.  He receives home health visits a couple of times a week, including therapy sessions and nursing care. He has cameras set up in his apartment for safety monitoring.       Past Medical History:  Diagnosis Date   Anxiety    Arthritis    Cataract    CHF (congestive heart failure) (HCC)    Constipation    pt states goes EOD- hard stools    Coronary artery disease    Depression    Diabetes mellitus without complication (HCC)    Diabetic retinopathy (HCC)    DKA (diabetic ketoacidoses)    Hyperlipidemia    Hypertension    off meds   Hypertensive retinopathy    Lesion of ulnar nerve, left upper limb 07/12/2017   Non-intractable vomiting without nausea 06/10/2018   Postherpetic neuralgia 08/11/2021   Speculative diagnosis   Psoriasis    Tubular adenoma of colon 03/2019    Past Surgical History:  Procedure Laterality Date   ABDOMINAL AORTOGRAM W/LOWER EXTREMITY N/A 08/30/2020   Procedure: ABDOMINAL AORTOGRAM W/LOWER EXTREMITY;  Surgeon: Nada Libman, MD;  Location: [facility];  Service: Cardiovascular;  Laterality: N/A;   ABDOMINAL AORTOGRAM W/LOWER EXTREMITY N/A 11/15/2020   Procedure: ABDOMINAL AORTOGRAM W/LOWER EXTREMITY;  Surgeon: Nada Libman, MD;  Location: [facility];  Service: Cardiovascular;  Laterality: N/A;   ANKLE SURGERY     right   CARDIAC CATHETERIZATION  09/25/2001   COLONOSCOPY     ~10 yr ago- normal  per pt     CORONARY ARTERY BYPASS GRAFT  09/30/2001   CABG x 4   IR THORACENTESIS ASP PLEURAL SPACE W/IMG GUIDE  05/19/2020   IR THORACENTESIS ASP PLEURAL SPACE W/IMG GUIDE  02/04/2023   LAPAROSCOPIC INGUINAL HERNIA REPAIR Bilateral 02/09/2010   MASS EXCISION Left 03/12/2013   Procedure: LEFT INDEX EXCISION MASS AND DEBRIDMENT DISTAL INTERPHALANGEAL JOINT;  Surgeon: Tami Ribas, MD;  Location: [facility];  Service: Orthopedics;  Laterality: Left;   PERIPHERAL VASCULAR BALLOON ANGIOPLASTY Left 08/30/2020   Procedure: PERIPHERAL VASCULAR BALLOON ANGIOPLASTY;  Surgeon: Nada Libman, MD;  Location: [facility];  Service: Cardiovascular;  Laterality: Left;  Peroneal   TRANSESOPHAGEAL ECHOCARDIOGRAM (CATH LAB) N/A 11/26/2022   Procedure: TRANSESOPHAGEAL ECHOCARDIOGRAM;  Surgeon: [name], MD;  [facility] INVASIVE CV LAB;  Service: Cardiovascular;  Laterality: N/A;    Family History  Problem Relation Age of Onset   Lymphoma Father    Cancer Paternal Grandmother        Colon Cancer   Colon cancer Paternal Grandmother    Colon polyps Neg Hx    Esophageal cancer Neg Hx    Rectal cancer Neg Hx    Stomach cancer Neg Hx    Diabetes Mellitus I Neg Hx  Pancreatic cancer Neg Hx     Social History   Tobacco Use   Smoking status: Some Days    Types: Cigars    Last attempt to quit: 2022    Years since quitting: 3.2   Smokeless tobacco: Never  Vaping Use   Vaping status: Never Used  Substance Use Topics   Alcohol use: Yes    Comment: about once a year   Drug use: No     Allergies  Allergen Reactions   Wool Alcohol [Lanolin] Hives and Itching    Wool fabric    Review of Systems NEGATIVE UNLESS OTHERWISE INDICATED IN HPI      Objective:     BP (!) 166/80 (BP Location: Left Arm, Patient Position: Sitting, Cuff Size: Normal)   Pulse 65   Temp (!) 97.2 F (36.2 C) (Temporal)   Ht 6\' 5"  (1.956 m)   Wt 167 lb 3.2 oz (75.8 kg)   SpO2 97%   BMI 19.83  kg/m   Wt Readings from Last 3 Encounters:  03/15/23 168 lb (76.2 kg)  03/14/23 167 lb 3.2 oz (75.8 kg)  03/01/23 169 lb (76.7 kg)    BP Readings from Last 3 Encounters:  03/15/23 (!) 158/70  03/14/23 (!) 166/80  03/01/23 (!) 188/91     Physical Exam Vitals and nursing note reviewed.  Constitutional:      General: He is not in acute distress.    Appearance: Normal appearance. He is not ill-appearing.     Comments: Wearing winter gloves  Using rollator walker, unsteady in general  Poor hygiene overall   HENT:     Right Ear: Tympanic membrane normal.     Left Ear: Tympanic membrane normal.     Mouth/Throat:     Mouth: Mucous membranes are dry.  Eyes:     Extraocular Movements: Extraocular movements intact.     Conjunctiva/sclera: Conjunctivae normal.     Pupils: Pupils are equal, round, and reactive to light.  Cardiovascular:     Rate and Rhythm: Normal rate and regular rhythm.     Heart sounds: No murmur heard. Pulmonary:     Effort: Pulmonary effort is normal.     Breath sounds: Normal breath sounds. No wheezing.  Musculoskeletal:     Right lower leg: No edema.     Left lower leg: No edema.  Skin:    General: Skin is dry.     Findings: Lesion (lateral left plantar foot approx 1 cm wound with granulation tissue, no surrounding erythema or edema; no pus) present.  Neurological:     Mental Status: He is alert and oriented to person, place, and time.     Cranial Nerves: No cranial nerve deficit.     Motor: Weakness (generalized, unsteady) present.  Psychiatric:        Mood and Affect: Mood is not depressed. Affect is flat.     Comments: baseline      Time Spent: 33 minutes of total time was spent on the date of the encounter performing the following actions: chart review prior to seeing the patient, obtaining history, performing a medically necessary exam, counseling on the treatment plan, placing orders, and documenting in our EHR.       Gloris Shiroma M Jaxan Michel,  PA-C

## 2023-03-15 ENCOUNTER — Ambulatory Visit: Payer: Self-pay | Admitting: Licensed Clinical Social Worker

## 2023-03-15 ENCOUNTER — Ambulatory Visit: Payer: PPO | Attending: Cardiovascular Disease | Admitting: Cardiovascular Disease

## 2023-03-15 ENCOUNTER — Encounter: Payer: Self-pay | Admitting: Cardiovascular Disease

## 2023-03-15 VITALS — BP 158/70 | HR 61 | Ht 74.0 in | Wt 168.0 lb

## 2023-03-15 DIAGNOSIS — I10 Essential (primary) hypertension: Secondary | ICD-10-CM

## 2023-03-15 DIAGNOSIS — I25810 Atherosclerosis of coronary artery bypass graft(s) without angina pectoris: Secondary | ICD-10-CM

## 2023-03-15 DIAGNOSIS — E782 Mixed hyperlipidemia: Secondary | ICD-10-CM

## 2023-03-15 DIAGNOSIS — J90 Pleural effusion, not elsewhere classified: Secondary | ICD-10-CM

## 2023-03-15 DIAGNOSIS — I5032 Chronic diastolic (congestive) heart failure: Secondary | ICD-10-CM

## 2023-03-15 DIAGNOSIS — I739 Peripheral vascular disease, unspecified: Secondary | ICD-10-CM

## 2023-03-15 DIAGNOSIS — I4819 Other persistent atrial fibrillation: Secondary | ICD-10-CM | POA: Diagnosis not present

## 2023-03-15 LAB — HM DIABETES EYE EXAM

## 2023-03-15 MED ORDER — SPIRONOLACTONE 25 MG PO TABS: 25.0000 mg | ORAL_TABLET | Freq: Every day | ORAL | 3 refills | Status: DC

## 2023-03-15 NOTE — Patient Instructions (Addendum)
 Medication Instructions:  - STOP Aspirin 81mg   - START aldactone 20mg  daily     *If you need a refill on your cardiac medications before your next appointment, please call your pharmacy*   Lab Work: NONE    If you have labs (blood work) drawn today and your tests are completely normal, you will receive your results only by: MyChart Message (if you have MyChart) OR A paper copy in the mail If you have any lab test that is abnormal or we need to change your treatment, we will call you to review the results.   Testing/Procedures:  Echo will be scheduled at [address].  Your physician has requested that you have an echocardiogram. Echocardiography is a painless test that uses sound waves to create images of your heart. It provides your doctor with information about the size and shape of your heart and how well your heart's chambers and valves are working. This procedure takes approximately one hour. There are no restrictions for this procedure. Please do NOT wear cologne, perfume, aftershave, or lotions (deodorant is allowed). Please arrive 15 minutes prior to your appointment time.    Dr. Flora Lipps has ordered a CT SCAN OF YOUR CHEST WITHOUT CONTRAST. Non-Cardiac CT Angiography (CTA), is a special type of CT scan that uses a computer to produce multi-dimensional views of major blood vessels throughout the body. In CT angiography, a contrast material is injected through an IV to help visualize the blood vessels     Follow-Up: At [facility], you and your health needs are our priority.  As part of our continuing mission to provide you with exceptional heart care, we have created designated Provider Care Teams.  These Care Teams include your primary Cardiologist (physician) and Advanced Practice Providers (APPs -  Physician Assistants and Nurse Practitioners) who all work together to provide you with the care you need, when you need it.  We recommend signing up for the  patient portal called "MyChart".  Sign up information is provided on this After Visit Summary.  MyChart is used to connect with patients for Virtual Visits (Telemedicine).  Patients are able to view lab/test results, encounter notes, upcoming appointments, etc.  Non-urgent messages can be sent to your provider as well.   To learn more about what you can do with MyChart, go to [email or web address].    Your next appointment:   3 month(s)  The format for your next appointment:   In Person  Provider:   Micah Flesher, PA-C, Marjie Skiff, PA-C, Robet Leu, PA-C, Juanda Crumble, PA-C, Joni Reining, DNP, ANP, Azalee Course, PA-C, Bernadene Person, NP, or Reather Littler, NP    Then, Reatha Harps, MD will plan to see you again in 6 month(s).   Other Instructions

## 2023-03-18 ENCOUNTER — Telehealth: Payer: Self-pay

## 2023-03-18 ENCOUNTER — Other Ambulatory Visit (HOSPITAL_COMMUNITY): Payer: Self-pay

## 2023-03-18 ENCOUNTER — Ambulatory Visit: Payer: PPO | Admitting: Gastroenterology

## 2023-03-18 NOTE — Telephone Encounter (Signed)
 PA needed for G7.  Patient did just pick up some of the G6 on 03/14/23

## 2023-03-18 NOTE — Patient Outreach (Signed)
 Care Coordination   Follow Up Visit Note   03/15/2023 Name: [name] [name] MRN: [id number] DOB: 1955/03/25  [name] [name] is a 68 y.o. year old male who sees [name], [name], PA-C for primary care. I spoke with  [name] [name]'s son by phone today.  What matters to the patients health and wellness today?  Symptom Management    Goals Addressed             This Visit's Progress    Obtain Supportive Resources-Caregiver Stress   On track    Activities and task to complete in order to accomplish goals.   Keep all upcoming appointments discussed today Continue with compliance of taking medication prescribed by Doctor Implement healthy coping skills discussed to assist with management of symptoms Continue working with [facility] care team to assist with goals identified         SDOH assessments and interventions completed:  No     Care Coordination Interventions:  Yes, provided  Interventions Today    Flowsheet Row Most Recent Value  Chronic Disease   Chronic disease during today's visit Congestive Heart Failure (CHF), Hypertension (HTN), Other  [MDD]  General Interventions   General Interventions Discussed/Reviewed General Interventions Reviewed, Doctor Visits  [Per son, pt has lost vision in left eye, tx plan has been established to maintain vision in right eye. Cardiology has adjusted meds]  Doctor Visits Discussed/Reviewed Doctor Visits Reviewed, Specialist  Mental Health Interventions   Mental Health Discussed/Reviewed Mental Health Reviewed, Coping Strategies  [Pt's son continues to provide support to patient]       Follow up plan: Follow up call scheduled for 4-6 weeks    Encounter Outcome:  Patient Visit Completed   Jenel Lucks, LCSW [place]  [facility], [facility] Clinical Social Worker Direct Dial: [phone]  Fax: [phone] Website: [email or web address] 8:47 AM

## 2023-03-18 NOTE — Patient Instructions (Signed)
 Visit Information  Thank you for taking time to visit with me today. Please don't hesitate to contact me if I can be of assistance to you.   Following are the goals we discussed today:   Goals Addressed             This Visit's Progress    Obtain Supportive Resources-Caregiver Stress   On track    Activities and task to complete in order to accomplish goals.   Keep all upcoming appointments discussed today Continue with compliance of taking medication prescribed by Doctor Implement healthy coping skills discussed to assist with management of symptoms Continue working with [facility] care team to assist with goals identified         Our next appointment is by telephone on 4/25 at 3 PM  Please call the care guide team at [phone] if you need to cancel or reschedule your appointment.   If you are experiencing a Mental Health or Behavioral Health Crisis or need someone to talk to, please call the Suicide and Crisis Lifeline: 988 call 911   Patient verbalizes understanding of instructions and care plan provided today and agrees to view in MyChart. Active MyChart status and patient understanding of how to access instructions and care plan via MyChart confirmed with patient.     Windy Fast [facility]  [facility], [facility] Clinical Social Worker Direct Dial: [phone]  Fax: [phone] Website: [email or web address] 8:56 AM

## 2023-03-18 NOTE — Telephone Encounter (Signed)
 Pharmacy Patient Advocate Encounter   Received notification from Pt Calls Messages that prior authorization for Dexcom G7 sensor is required/requested.   Insurance verification completed.   The patient is insured through [facility] AD[employer]/RX [employer] .   Per test claim:  Freestyle libre 3 plus is preferred by the insurance.  If suggested medication is appropriate, Please send in a new RX and discontinue this one. If not, please advise as to why it's not appropriate so that we may request a Prior Authorization. Please note, some preferred medications may still require a PA.  If the suggested medications have not been trialed and there are no contraindications to their use, the PA will not be submitted, as it will not be approved.   While Freestyle is preferred, test claim is showing too soon to fill since he just picked up the Dexcom G6. He picked up a 90 day supply so next fill is 05/15/23

## 2023-03-19 LAB — HM DIABETES EYE EXAM

## 2023-03-20 LAB — ACID FAST CULTURE WITH REFLEXED SENSITIVITIES (MYCOBACTERIA): Acid Fast Culture: NEGATIVE

## 2023-03-21 ENCOUNTER — Other Ambulatory Visit: Payer: Self-pay | Admitting: Physician Assistant

## 2023-03-21 DIAGNOSIS — E785 Hyperlipidemia, unspecified: Secondary | ICD-10-CM

## 2023-03-21 NOTE — Telephone Encounter (Signed)
 Patient son [name] states that they do have a 90 day supply of the G6 and will call closer to may to have PA ran for the G7

## 2023-03-25 ENCOUNTER — Other Ambulatory Visit: Payer: Self-pay | Admitting: Physician Assistant

## 2023-03-25 DIAGNOSIS — I10 Essential (primary) hypertension: Secondary | ICD-10-CM

## 2023-03-29 ENCOUNTER — Ambulatory Visit (HOSPITAL_BASED_OUTPATIENT_CLINIC_OR_DEPARTMENT_OTHER)
Admission: RE | Admit: 2023-03-29 | Discharge: 2023-03-29 | Disposition: A | Discharge status: Home / Self Care | Payer: PPO | Source: Ambulatory Visit | Attending: Cardiovascular Disease | Admitting: Cardiovascular Disease

## 2023-03-29 ENCOUNTER — Encounter: Payer: Self-pay | Admitting: Cardiovascular Disease

## 2023-03-29 ENCOUNTER — Ambulatory Visit (HOSPITAL_COMMUNITY)
Admission: RE | Admit: 2023-03-29 | Discharge: 2023-03-29 | Disposition: A | Discharge status: Home / Self Care | Payer: PPO | Source: Ambulatory Visit | Attending: Cardiovascular Disease | Admitting: Cardiovascular Disease

## 2023-03-29 ENCOUNTER — Ambulatory Visit (HOSPITAL_COMMUNITY)

## 2023-03-29 DIAGNOSIS — I739 Peripheral vascular disease, unspecified: Secondary | ICD-10-CM | POA: Diagnosis present

## 2023-03-29 LAB — VAS US ABI WITH/WO TBI
Left ABI: 1.1
Right ABI: 1.23

## 2023-04-02 ENCOUNTER — Telehealth: Payer: Self-pay | Admitting: Adult Health

## 2023-04-02 LAB — HM DIABETES EYE EXAM

## 2023-04-02 NOTE — Telephone Encounter (Signed)
 LVM and sent mychart msg informing pt of need to reschedule 08/15/23 appt - NP out

## 2023-04-04 ENCOUNTER — Telehealth: Payer: Self-pay | Admitting: Physician Assistant

## 2023-04-04 NOTE — Telephone Encounter (Signed)
 Paperwork placed in provider basket for review and sign to be faxed back

## 2023-04-04 NOTE — Telephone Encounter (Signed)
 [facility] faxed Home Health Certificate (Order ID [id number]), to be filled out by provider. Patient requested to send it back via Fax within 5-days. Document is located in providers tray at front office.Please advise at  [phone].

## 2023-04-05 ENCOUNTER — Ambulatory Visit (HOSPITAL_COMMUNITY)

## 2023-04-07 ENCOUNTER — Other Ambulatory Visit: Payer: Self-pay | Admitting: Physician Assistant

## 2023-04-07 DIAGNOSIS — I5033 Acute on chronic diastolic (congestive) heart failure: Secondary | ICD-10-CM

## 2023-04-08 NOTE — Telephone Encounter (Signed)
 Fax sent and copy in my desk

## 2023-04-11 ENCOUNTER — Other Ambulatory Visit: Payer: Self-pay | Admitting: Physician Assistant

## 2023-04-11 DIAGNOSIS — F3341 Major depressive disorder, recurrent, in partial remission: Secondary | ICD-10-CM

## 2023-04-12 ENCOUNTER — Ambulatory Visit (HOSPITAL_BASED_OUTPATIENT_CLINIC_OR_DEPARTMENT_OTHER)
Admission: RE | Admit: 2023-04-12 | Discharge: 2023-04-12 | Disposition: A | Discharge status: Home / Self Care | Source: Ambulatory Visit | Attending: Cardiovascular Disease | Admitting: Cardiovascular Disease

## 2023-04-12 DIAGNOSIS — I5032 Chronic diastolic (congestive) heart failure: Secondary | ICD-10-CM | POA: Insufficient documentation

## 2023-04-19 ENCOUNTER — Other Ambulatory Visit: Payer: Self-pay | Admitting: Cardiovascular Disease

## 2023-04-19 ENCOUNTER — Ambulatory Visit (HOSPITAL_COMMUNITY): Admission: RE | Admit: 2023-04-19 | Source: Ambulatory Visit

## 2023-04-19 ENCOUNTER — Other Ambulatory Visit: Payer: Self-pay | Admitting: Physician Assistant

## 2023-04-19 DIAGNOSIS — F3341 Major depressive disorder, recurrent, in partial remission: Secondary | ICD-10-CM

## 2023-04-19 DIAGNOSIS — I480 Paroxysmal atrial fibrillation: Secondary | ICD-10-CM

## 2023-04-19 NOTE — Telephone Encounter (Signed)
 Prescription refill request for Eliquis  received. Indication: a fib Last office visit: 03/15/23 Scr: 0.81 epic 03/01/23 Age: 68 Weight: 76kg

## 2023-04-22 ENCOUNTER — Encounter (HOSPITAL_COMMUNITY): Payer: Self-pay | Admitting: Cardiovascular Disease

## 2023-04-22 ENCOUNTER — Other Ambulatory Visit: Payer: Self-pay | Admitting: *Deleted

## 2023-04-23 LAB — HM DIABETES EYE EXAM

## 2023-04-26 ENCOUNTER — Ambulatory Visit: Admitting: Physician Assistant

## 2023-04-26 ENCOUNTER — Ambulatory Visit (INDEPENDENT_AMBULATORY_CARE_PROVIDER_SITE_OTHER): Admitting: Physician Assistant

## 2023-04-26 ENCOUNTER — Ambulatory Visit: Payer: Self-pay | Admitting: Licensed Clinical Social Worker

## 2023-04-26 ENCOUNTER — Encounter: Payer: Self-pay | Admitting: Physician Assistant

## 2023-04-26 VITALS — BP 170/88 | HR 62 | Temp 97.2°F | Ht 74.0 in | Wt 156.8 lb

## 2023-04-26 DIAGNOSIS — Z9109 Other allergy status, other than to drugs and biological substances: Secondary | ICD-10-CM | POA: Diagnosis not present

## 2023-04-26 DIAGNOSIS — I480 Paroxysmal atrial fibrillation: Secondary | ICD-10-CM

## 2023-04-26 DIAGNOSIS — R443 Hallucinations, unspecified: Secondary | ICD-10-CM

## 2023-04-26 DIAGNOSIS — I5032 Chronic diastolic (congestive) heart failure: Secondary | ICD-10-CM

## 2023-04-26 DIAGNOSIS — Z7984 Long term (current) use of oral hypoglycemic drugs: Secondary | ICD-10-CM

## 2023-04-26 DIAGNOSIS — E1165 Type 2 diabetes mellitus with hyperglycemia: Secondary | ICD-10-CM | POA: Diagnosis not present

## 2023-04-26 DIAGNOSIS — D649 Anemia, unspecified: Secondary | ICD-10-CM

## 2023-04-26 DIAGNOSIS — R748 Abnormal levels of other serum enzymes: Secondary | ICD-10-CM

## 2023-04-26 LAB — CBC WITH DIFFERENTIAL/PLATELET
Basophils Absolute: 0 10*3/uL (ref 0.0–0.1)
Basophils Relative: 0.3 % (ref 0.0–3.0)
Eosinophils Absolute: 0.1 10*3/uL (ref 0.0–0.7)
Eosinophils Relative: 1.8 % (ref 0.0–5.0)
HCT: 34.6 % — ABNORMAL LOW (ref 39.0–52.0)
Hemoglobin: 11.1 g/dL — ABNORMAL LOW (ref 13.0–17.0)
Lymphocytes Relative: 21.8 % (ref 12.0–46.0)
Lymphs Abs: 1 10*3/uL (ref 0.7–4.0)
MCHC: 32.3 g/dL (ref 30.0–36.0)
MCV: 81.9 fl (ref 78.0–100.0)
Monocytes Absolute: 0.4 10*3/uL (ref 0.1–1.0)
Monocytes Relative: 9.7 % (ref 3.0–12.0)
Neutro Abs: 3 10*3/uL (ref 1.4–7.7)
Neutrophils Relative %: 66.4 % (ref 43.0–77.0)
Platelets: 214 10*3/uL (ref 150.0–400.0)
RBC: 4.22 Mil/uL (ref 4.22–5.81)
RDW: 16.6 % — ABNORMAL HIGH (ref 11.5–15.5)
WBC: 4.6 10*3/uL (ref 4.0–10.5)

## 2023-04-26 LAB — COMPREHENSIVE METABOLIC PANEL WITH GFR
ALT: 9 U/L (ref 0–53)
AST: 14 U/L (ref 0–37)
Albumin: 3 g/dL — ABNORMAL LOW (ref 3.5–5.2)
Alkaline Phosphatase: 167 U/L — ABNORMAL HIGH (ref 39–117)
BUN: 22 mg/dL (ref 6–23)
CO2: 35 meq/L — ABNORMAL HIGH (ref 19–32)
Calcium: 8.4 mg/dL (ref 8.4–10.5)
Chloride: 96 meq/L (ref 96–112)
Creatinine, Ser: 0.97 mg/dL (ref 0.40–1.50)
GFR: 79.25 mL/min (ref >60.00)
Glucose, Bld: 228 mg/dL — ABNORMAL HIGH (ref 70–99)
Potassium: 3.6 meq/L (ref 3.5–5.1)
Sodium: 138 meq/L (ref 135–145)
Total Bilirubin: 0.5 mg/dL (ref 0.2–1.2)
Total Protein: 6 g/dL (ref 6.0–8.3)

## 2023-04-26 LAB — IBC + FERRITIN
Ferritin: 54.2 ng/mL (ref 22.0–322.0)
Iron: 45 ug/dL (ref 42–165)
Saturation Ratios: 13.1 % — ABNORMAL LOW (ref 20.0–50.0)
TIBC: 344.4 ug/dL (ref 250.0–450.0)
Transferrin: 246 mg/dL (ref 212.0–360.0)

## 2023-04-26 MED ORDER — LORATADINE 10 MG PO TABS: 10.0000 mg | ORAL_TABLET | Freq: Every day | ORAL | 1 refills | Status: DC

## 2023-04-26 NOTE — Addendum Note (Signed)
 Addended by: Lauri Poot on: 04/26/2023 03:41 PM   Modules accepted: Orders

## 2023-04-26 NOTE — Patient Outreach (Signed)
 Complex Care Management   Visit Note  04/26/2023  Name:  [name] [name] MRN: [id number] DOB: 1955/09/30  Situation: Referral received for Complex Care Management related to Dementia I obtained verbal consent from Caregiver.  Visit completed with caregiver  on the phone  Background:   Past Medical History:  Diagnosis Date   Anxiety    Arthritis    Cataract    CHF (congestive heart failure) (HCC)    Constipation    pt states goes EOD- hard stools    Coronary artery disease    Depression    Diabetes mellitus without complication (HCC)    Diabetic retinopathy (HCC)    DKA (diabetic ketoacidoses)    Hyperlipidemia    Hypertension    off meds   Hypertensive retinopathy    Lesion of ulnar nerve, left upper limb 07/12/2017   Non-intractable vomiting without nausea 06/10/2018   Postherpetic neuralgia 08/11/2021   Speculative diagnosis   Psoriasis    Tubular adenoma of colon 03/2019    Assessment: Patient Reported Symptoms:  Cognitive Cognitive Status: Able to follow simple commands, Poor judgment in daily scenarios, Struggling with memory recall   Health Maintenance Behaviors: None  Neurological Neurological Review of Symptoms: Not assessed    HEENT HEENT Symptoms Reported: Not assessed      Cardiovascular Cardiovascular Symptoms Reported: Not assessed    Respiratory Respiratory Symptoms Reported: No symptoms reported    Endocrine Patient reports the following symptoms related to hypoglycemia or hyperglycemia : No symptoms reported    Gastrointestinal Gastrointestinal Symptoms Reported: No symptoms reported      Genitourinary Genitourinary Symptoms Reported: No symptoms reported    Integumentary Integumentary Symptoms Reported: No symptoms reported    Musculoskeletal Musculoskelatal Symptoms Reviewed: Not assessed        Psychosocial Psychosocial Symptoms Reported: Not assessed            03/01/2023   11:44 AM  Depression screen PHQ 2/9  Decreased  Interest 0  Down, Depressed, Hopeless 1  PHQ - 2 Score 1  Altered sleeping 2  Tired, decreased energy 0  Change in appetite 0  Feeling bad or failure about yourself  3  Trouble concentrating 0  Moving slowly or fidgety/restless 0  Suicidal thoughts 0  PHQ-9 Score 6  Difficult doing work/chores Not difficult at all    There were no vitals filed for this visit.  Medications Reviewed Today   Medications were not reviewed in this encounter     Recommendation:   Continue to follow up with PCP as needed. Continue to make medications as prescribed. Monitor for any changes to cognition or functional status and notify PCP.  Follow Up Plan:   Patient has met all care management goals. Care Management case will be closed. Patient has been provided contact information should new needs arise.   Hale Level, LCSW [place], [facility] Licensed Clinical Social Worker Care Coordinator [phone]

## 2023-04-26 NOTE — Patient Instructions (Signed)
 Visit Information  Thank you for taking time to visit with me today. Please don't hesitate to contact me if I can be of assistance to you before our next scheduled appointment.   Patient has met all care management goals. Care Management case will be closed. Patient has been provided contact information should new needs arise.   Please call the care guide team at [phone] if you need to cancel, schedule, or reschedule an appointment.   Please call the Suicide and Crisis Lifeline: 988 if you are experiencing a Mental Health or Behavioral Health Crisis or need someone to talk to.  Kenton Kingfisher, LCSW [place], [facility] Licensed Clinical Social Worker Care Coordinator [phone]

## 2023-04-26 NOTE — Progress Notes (Signed)
 Patient ID: [name] [name], male    DOB: 29-Jun-1955, 68 y.o.   MRN: [id number]   Assessment & Plan:  Uncontrolled type 2 diabetes mellitus with hyperglycemia (HCC) -     Microalbumin / creatinine urine ratio -     Urinalysis, Routine w reflex microscopic -     CBC with Differential/Platelet -     Comprehensive metabolic panel with GFR -     IBC + Ferritin  Environmental allergies  Low hemoglobin -     Urinalysis, Routine w reflex microscopic -     CBC with Differential/Platelet -     IBC + Ferritin  Alkaline phosphatase elevation -     Comprehensive metabolic panel with GFR  Hallucinations  PAF (paroxysmal atrial fibrillation) (HCC)  Chronic diastolic CHF (congestive heart failure) (HCC)  Chronic anemia  Other orders -     Loratadine; Take 1 tablet (10 mg total) by mouth daily.  Dispense: 30 tablet; Refill: 1      Assessment and Plan Assessment & Plan Heart failure Heart failure with recurrent pleural effusions. Echocardiogram was missed due to feeling unwell. Currently on furosemide  and spironolactone . Monitoring labs to prevent rehospitalization is a priority. - Reschedule echocardiogram - Continue furosemide  and spironolactone  per cardiology  Atrial fibrillation Persistent atrial fibrillation managed with Eliquis . Aspirin  was stopped. Monitoring for bleeding is important due to anticoagulation therapy. - Continue Eliquis   Coronary artery disease Coronary artery disease managed with Lipitor 40 mg. - Continue Lipitor 40 mg  Hypertension Blood pressure was elevated at 170/88 initially but improved on repeat. Goal is less than 130/80. Home monitoring is encouraged to prevent rehospitalization. - Encourage home blood pressure monitoring - Follow up in 3 months or sooner if needed  Chronic anemia Chronic anemia with previously low hemoglobin. No active bleeding reported. Foot wound is healing. Blood work to monitor anemia was agreed upon. - Collect CBC,  CMP, iron, and ferritin - Recheck urinalysis and urine microalbumin  Allergic rhinitis New onset allergic rhinitis with congestion. Prefers oral medication. Claritin chosen for its lower sedative effect compared to Zyrtec. - Start Claritin once daily - Advise to stay hydrated while taking Claritin  T2DM - Following with endocrinology   Hallucinations - No recent issues   Return in about 3 months (around 07/26/2023) for recheck/follow-up.    Subjective:    Chief Complaint  Patient presents with   Medical Management of Chronic Issues    Pt in office for 6 wk follow up; pt son wants PCP to be aware Cardiology added Spirolactone 25 mg back to medication, per son pt is taking all medications that are prescribed, not needing refills today    HPI Discussed the use of AI scribe software for clinical note transcription with the patient, who gave verbal consent to proceed.  History of Present Illness [name] [name] is a 68 year old male with peripheral artery disease, coronary artery disease, diabetes, heart failure, and hypertension who presents for a regular follow-up visit. Here with his son.   He has a history of recurrent pleural effusions requiring thoracentesis and multiple hospitalizations. His risk of rehospitalization is high. Since his last visit on March 13th, he has seen cardiology. He is on Eliquis  for atrial fibrillation, and aspirin  has been discontinued. He continues to take Lipitor 40 mg for cholesterol management.  He experiences hallucinations, which he attributes to dehydration. His son notes he is not proactive in drinking fluids, and he encourages him to drink Gatorade. He feels  lethargic and down in the past couple of weeks, attributed to allergies. He has congestion due to pollen but is not on any allergy medication. This is the first year he recalls having allergies.  He has significant weight loss, approximately 12 pounds since the last visit, attributed to  fluid loss from Lasix . His weight has fluctuated over the years, previously reaching 202 pounds in 2015. He is currently around 156 pounds, which is considered more normal for him.  He has difficulty providing a urine sample on command, likely due to frequent urination from Lasix . He has a foot wound being treated by nurses, which is reportedly improving. No bleeding issues, but his hemoglobin has been low in the past. He experiences constipation but no stomach pain or significant bowel changes.  He missed an echocardiogram appointment last Friday due to not feeling well and has not yet rescheduled.     Past Medical History:  Diagnosis Date   Anxiety    Arthritis    Cataract    CHF (congestive heart failure) (HCC)    Constipation    pt states goes EOD- hard stools    Coronary artery disease    Depression    Diabetes mellitus without complication (HCC)    Diabetic retinopathy (HCC)    DKA (diabetic ketoacidoses)    Hyperlipidemia    Hypertension    off meds   Hypertensive retinopathy    Lesion of ulnar nerve, left upper limb 07/12/2017   Non-intractable vomiting without nausea 06/10/2018   Postherpetic neuralgia 08/11/2021   Speculative diagnosis   Psoriasis    Tubular adenoma of colon 03/2019    Past Surgical History:  Procedure Laterality Date   ABDOMINAL AORTOGRAM W/LOWER EXTREMITY N/A 08/30/2020   Procedure: ABDOMINAL AORTOGRAM W/LOWER EXTREMITY;  Surgeon: Margherita Shell, MD;  Location: [facility] INVAS[facility];  Service: Cardiovascular;  Laterality: N/A;   ABDOMINAL AORTOGRAM W/LOWER EXTREMITY N/A 11/15/2020   Procedure: ABDOMINAL AORTOGRAM W/LOWER EXTREMITY;  Surgeon: Margherita Shell, MD;  Location: [facility];  Service: Cardiovascular;  Laterality: N/A;   ANKLE SURGERY     right   CARDIAC CATHETERIZATION  09/25/2001   COLONOSCOPY     ~10 yr ago- normal  per pt    CORONARY ARTERY BYPASS GRAFT  09/30/2001   CABG x 4   IR THORACENTESIS ASP PLEURAL SPACE W/IMG GUIDE   05/19/2020   IR THORACENTESIS ASP PLEURAL SPACE W/IMG GUIDE  02/04/2023   LAPAROSCOPIC INGUINAL HERNIA REPAIR Bilateral 02/09/2010   MASS EXCISION Left 03/12/2013   Procedure: LEFT INDEX EXCISION MASS AND DEBRIDMENT DISTAL INTERPHALANGEAL JOINT;  Surgeon: Milagros Alf, MD;  Location: [facility];  Service: Orthopedics;  Laterality: Left;   PERIPHERAL VASCULAR BALLOON ANGIOPLASTY Left 08/30/2020   Procedure: PERIPHERAL VASCULAR BALLOON ANGIOPLASTY;  Surgeon: Margherita Shell, MD;  Location: [facility];  Service: Cardiovascular;  Laterality: Left;  Peroneal   TRANSESOPHAGEAL ECHOCARDIOGRAM (CATH LAB) N/A 11/26/2022   Procedure: TRANSESOPHAGEAL ECHOCARDIOGRAM;  Surgeon: Wendie Hamburg, MD;  [facility];  Service: Cardiovascular;  Laterality: N/A;    Family History  Problem Relation Age of Onset   Lymphoma Father    Cancer Paternal Grandmother        Colon Cancer   Colon cancer Paternal Grandmother    Colon polyps Neg Hx    Esophageal cancer Neg Hx    Rectal cancer Neg Hx    Stomach cancer Neg Hx    Diabetes Mellitus I  Neg Hx    Pancreatic cancer Neg Hx     Social History   Tobacco Use   Smoking status: Some Days    Types: Cigars    Last attempt to quit: 2022    Years since quitting: 3.3   Smokeless tobacco: Never  Vaping Use   Vaping status: Never Used  Substance Use Topics   Alcohol use: Yes    Comment: about once a year   Drug use: No     Allergies  Allergen Reactions   Wool Alcohol [Lanolin] Hives and Itching    Wool fabric    Review of Systems NEGATIVE UNLESS OTHERWISE INDICATED IN HPI      Objective:     BP (!) 170/88 (BP Location: Left Arm, Patient Position: Sitting, Cuff Size: Normal)   Pulse 62   Temp (!) 97.2 F (36.2 C) (Temporal)   Ht 6\' 2"  (1.88 m)   Wt 156 lb 12.8 oz (71.1 kg)   SpO2 95%   BMI 20.13 kg/m   Wt Readings from Last 3 Encounters:  04/26/23 156 lb 12.8 oz (71.1 kg)  03/15/23 168 lb (76.2  kg)  03/14/23 167 lb 3.2 oz (75.8 kg)    BP Readings from Last 3 Encounters:  04/26/23 (!) 170/88  03/15/23 (!) 158/70  03/14/23 (!) 166/80     Physical Exam Vitals and nursing note reviewed.  Constitutional:      General: He is not in acute distress.    Appearance: Normal appearance. He is not ill-appearing.     Comments: Wearing winter gloves  Using rollator walker, unsteady in general  Poor hygiene overall   HENT:     Right Ear: Tympanic membrane normal.     Left Ear: Tympanic membrane normal.     Mouth/Throat:     Mouth: Mucous membranes are dry.  Eyes:     Extraocular Movements: Extraocular movements intact.     Conjunctiva/sclera: Conjunctivae normal.     Pupils: Pupils are equal, round, and reactive to light.  Cardiovascular:     Rate and Rhythm: Normal rate. Rhythm irregular.     Heart sounds: No murmur heard. Pulmonary:     Effort: Pulmonary effort is normal.     Breath sounds: Normal breath sounds. No wheezing.  Musculoskeletal:     Right lower leg: No edema.     Left lower leg: No edema.  Skin:    General: Skin is dry.  Neurological:     Mental Status: He is alert and oriented to person, place, and time.     Cranial Nerves: No cranial nerve deficit.     Motor: Weakness (generalized, unsteady) present.  Psychiatric:        Mood and Affect: Mood is not depressed. Affect is flat.     Comments: baseline             Trisa Cranor M Raihana Balderrama, PA-C

## 2023-04-29 ENCOUNTER — Encounter: Payer: Self-pay | Admitting: Physician Assistant

## 2023-05-05 ENCOUNTER — Encounter: Payer: Self-pay | Admitting: Cardiovascular Disease

## 2023-05-06 ENCOUNTER — Other Ambulatory Visit: Payer: Self-pay

## 2023-05-06 DIAGNOSIS — J90 Pleural effusion, not elsewhere classified: Secondary | ICD-10-CM

## 2023-05-07 ENCOUNTER — Telehealth: Payer: Self-pay | Admitting: Dietician

## 2023-05-07 NOTE — Telephone Encounter (Signed)
 Called and spoke to patient's son to follow up re:  insulin  pump and current treatment.  He stated that his father was taken off the pump due to severe lows and medication was changed which has helped his blood glucose.  No further needs at this time.  Cydne Doyne, RD, LDN, CDCES, DipACLM

## 2023-05-09 ENCOUNTER — Ambulatory Visit: Admitting: Pulmonary Disease

## 2023-05-09 ENCOUNTER — Encounter: Payer: Self-pay | Admitting: Pulmonary Disease

## 2023-05-09 VITALS — BP 156/77 | HR 62 | Ht 74.0 in | Wt 156.4 lb

## 2023-05-09 DIAGNOSIS — J90 Pleural effusion, not elsewhere classified: Secondary | ICD-10-CM | POA: Diagnosis not present

## 2023-05-09 DIAGNOSIS — I5032 Chronic diastolic (congestive) heart failure: Secondary | ICD-10-CM

## 2023-05-09 NOTE — Progress Notes (Signed)
 @Patient  ID: [name] [name], male    DOB: [birth year], 68 y.o.   MRN: [id number]  Chief Complaint  Patient presents with   Consult    Pleural effusion    Referring provider: Harrold Lincoln, *  HPI:   68 y.o. man with history of diastolic CHF and worsening for evaluation of bilateral pleural effusions.  Multiple cardiology notes reviewed.  Multiple hospital notes including discharge summary 02/2023 reviewed.  Patient was diagnosed with heart failure 2022.  Small right pleural effusion noted on chest x-ray at that time on my review and interpretation.  In May 2022 had a thoracentesis.  Body fluid analysis is consistent with a transudate.  He had a CT scan 12/2020 that showed no pleural effusions, evidence of mild hyperinflation otherwise clear lungs, no PE noted on CTA on my review and interpretation.  In 11/2018 for another chest x-ray was performed which shows bilateral pleural effusions, worse than prior.  He is hospitalized 02/2023.  With CHF exacerbation and seems.  Thoracentesis x 2 left and right was performed.  Only 1 was sent for fluid studies.  This is also consistent with a transudate.  His BNP was noted to be elevated.  TEE 11/2022 demonstrated left atrium dilation, consistent with chronic pulmonary venous hypertension.  TTE 02/2023, limited, revealed elevated right atrial pressure concerning for volume overload, mitral valve regurgitation and what appears to be diastolic dysfunction.  He has been taking his Lasix .  He had a repeat CT scan/2025 that showed similar-appearing bilateral pleural effusions somewhat loculated likely due to chronicity, compressive atelectasis, no other significant abnormality on my review and interpretation.  Questionaires / Pulmonary Flowsheets:   ACT:      No data to display          MMRC:     No data to display          Epworth:      No data to display          Tests:   FENO:  No results found for: "NITRICOXIDE"  PFT:      No data to display          WALK:      No data to display          Imaging: Personally reviewed and as per EMR and discussion in this note CT CHEST WO CONTRAST Result Date: 05/03/2023 CLINICAL DATA:  Diastolic heart failure. EXAM: CT CHEST WITHOUT CONTRAST TECHNIQUE: Multidetector CT imaging of the chest was performed following the standard protocol without IV contrast. RADIATION DOSE REDUCTION: This exam was performed according to the departmental dose-optimization program which includes automated exposure control, adjustment of the mA and/or kV according to patient size and/or use of iterative reconstruction technique. COMPARISON:  CTA chest, abdomen and pelvis 07/18/2021. Also, AP and lateral chest 03/01/2023, portable chest 02/04/2023, AP Lat chest 02/01/2023. FINDINGS: Cardiovascular: The heart is at the upper limit of normal for size. No substantial pericardial effusion. The coronary arteries are heavily calcified. There are sternotomy and CABG changes. There is mild to moderate patchy calcification of the thoracic aorta, scattered calcification in the great vessels. Aortic aneurysm. Enlarged pulmonary trunk measuring 3.4 cm indicating arterial hypertension, was previously 3 cm. Pulmonary veins are slightly prominent. Mediastinum/Nodes: Borderline prominent precarinal space lymph nodes up to 9 mm in short axis are stable. There is stable shotty subcentimeter in short axis right paratracheal, AP window and subcarinal nodes. No new adenopathy is seen without contrast, with limited evaluation  for hilar adenopathy. There is no axillary adenopathy. No thyroid  nodule. Unremarkable thoracic esophagus. Small amount of retained secretions in the thoracic trachea. The main bronchi are clear. Lungs/Pleura: There are moderate size right-greater-than-left layering pleural effusions. These are new from 07/18/2021 but similar in appearance to recent chest x-rays and similar in size when compared with a CT  of abdomen and pelvis from 11/24/2022. There is adjacent compressive collapse or consolidation in the basal segments of both lower lobes, and partial collapse of the lower lobe superior segments. Some of the fluid tracks subpulmonic on both sides and could be partially loculated. No interstitial edema is seen. There is mild scarring in both lung apices. Chronic paramediastinal scarring in the medial right upper lobe. Remainder of the aerated lungs are otherwise clear, as visualized allowing for pleural effusions. Upper Abdomen: No acute abnormality. Abdominal aortic atherosclerosis. No ascites in the visualized upper abdomen. Stomach distended with food substrate. Musculoskeletal: Osteopenia with mild degenerative change of the spine. Healed median sternotomy. No acute or significant osseous findings or chest wall mass. Slight edema in the body wall lower chest. IMPRESSION: 1. Moderate size right-greater-than-left layering pleural effusions, similar in size to recent chest x-rays and CT of abdomen and pelvis from 11/24/2022. Some of the fluid tracks subpulmonic on both sides and could be partially loculated. 2. Adjacent compressive collapse or consolidation in the basal segments of both lower lobes, and partial collapse of the lower lobe superior segments. 3. No interstitial edema is seen. 4. Enlarged pulmonary trunk indicating arterial hypertension. 5. Aortic and coronary artery atherosclerosis.  Status post CABG. 6. Stable borderline prominent mediastinal lymph nodes. 7. Stomach distended with food substrate. Query recent ingestion or impaired gastric emptying. 8. Osteopenia and degenerative change. Aortic Atherosclerosis (ICD10-I70.0). Electronically Signed   By: Denman Fischer M.D.   On: 05/03/2023 20:45    Lab Results: Personally reviewed CBC    Component Value Date/Time   WBC 4.6 04/26/2023 0934   RBC 4.22 04/26/2023 0934   HGB 11.1 (L) 04/26/2023 0934   HCT 34.6 (L) 04/26/2023 0934   PLT 214.0  04/26/2023 0934   MCV 81.9 04/26/2023 0934   MCH 29.7 02/07/2023 0251   MCHC 32.3 04/26/2023 0934   RDW 16.6 (H) 04/26/2023 0934   LYMPHSABS 1.0 04/26/2023 0934   MONOABS 0.4 04/26/2023 0934   EOSABS 0.1 04/26/2023 0934   BASOSABS 0.0 04/26/2023 0934    BMET    Component Value Date/Time   NA 138 04/26/2023 0934   K 3.6 04/26/2023 0934   CL 96 04/26/2023 0934   CO2 35 (H) 04/26/2023 0934   GLUCOSE 228 (H) 04/26/2023 0934   BUN 22 04/26/2023 0934   CREATININE 0.97 04/26/2023 0934   CALCIUM  8.4 04/26/2023 0934   GFRNONAA >60 02/07/2023 0251   GFRAA >60 06/09/2018 1647    BNP    Component Value Date/Time   BNP 97.3 02/06/2023 0255    ProBNP    Component Value Date/Time   PROBNP 399.0 (H) 03/01/2023 1157    Specialty Problems   None   Allergies  Allergen Reactions   Wool Alcohol [Lanolin] Hives and Itching    Wool fabric    Immunization History  Administered Date(s) Administered   Influenza Split 11/22/2010   Influenza-Unspecified 01/17/2018, 10/31/2019, 10/20/2020   PFIZER(Purple Top)SARS-COV-2 Vaccination 02/08/2019, 03/04/2019, 10/31/2019   Pneumococcal Conjugate-13 02/17/2018   Pneumococcal Polysaccharide-23 02/27/2011, 01/13/2020   Tdap 02/27/2011, 07/21/2014   Zoster, Live 03/18/2014    Past Medical History:  Diagnosis  Date   Anxiety    Arthritis    Cataract    CHF (congestive heart failure) (HCC)    Constipation    pt states goes EOD- hard stools    Coronary artery disease    Depression    Diabetes mellitus without complication (HCC)    Diabetic retinopathy (HCC)    DKA (diabetic ketoacidoses)    Hyperlipidemia    Hypertension    off meds   Hypertensive retinopathy    Lesion of ulnar nerve, left upper limb 07/12/2017   Non-intractable vomiting without nausea 06/10/2018   Postherpetic neuralgia 08/11/2021   Speculative diagnosis   Psoriasis    Tubular adenoma of colon 03/2019    Tobacco History: Social History   Tobacco Use   Smoking Status Some Days   Types: Cigars   Last attempt to quit: 2022   Years since quitting: 3.3  Smokeless Tobacco Never  Tobacco Comments   1-2 per week cigars  05/09/2023   Ready to quit: Not Answered Counseling given: Not Answered Tobacco comments: 1-2 per week cigars  05/09/2023   Continue to not smoke  Outpatient Encounter Medications as of 05/09/2023  Medication Sig   acetaminophen  (TYLENOL ) 500 MG tablet Take 500 mg by mouth as needed for mild pain or moderate pain.   amLODipine  (NORVASC ) 2.5 MG tablet TAKE 1 TABLET BY MOUTH EVERY DAY   atorvastatin  (LIPITOR) 40 MG tablet TAKE 1 TABLET BY MOUTH EVERY DAY   brimonidine (ALPHAGAN) 0.2 % ophthalmic solution 1 drop 2 (two) times daily.   Cholecalciferol  25 MCG (1000 UT) capsule Take 1,000 Units by mouth daily.   Continuous Glucose Sensor (DEXCOM G7 SENSOR) MISC 1 Device by Does not apply route as directed.   Continuous Glucose Transmitter (DEXCOM G6 TRANSMITTER) MISC as directed.   ELIQUIS  5 MG TABS tablet TAKE 1 TABLET BY MOUTH TWICE A DAY   furosemide  (LASIX ) 20 MG tablet TAKE 1 TABLET BY MOUTH EVERY DAY   glipiZIDE  (GLUCOTROL ) 10 MG tablet Take 1 tablet (10 mg total) by mouth daily before breakfast.   loperamide  (IMODIUM ) 2 MG capsule Take 1 capsule (2 mg total) by mouth 4 (four) times daily as needed for diarrhea or loose stools.   loratadine  (CLARITIN ) 10 MG tablet Take 1 tablet (10 mg total) by mouth daily.   mirtazapine  (REMERON ) 15 MG tablet TAKE 1 TABLET BY MOUTH EVERYDAY AT BEDTIME   Multiple Vitamins-Minerals (MULTIVITAMIN WITH MINERALS) tablet Take 1 tablet by mouth daily.   PARoxetine  (PAXIL ) 20 MG tablet TAKE 1 TABLET ONCE A DAY FOR DEPRESSION   QUEtiapine  (SEROQUEL ) 25 MG tablet GIVE 1 TABLET BY MOUTH AT BEDTIME FOR DELIRIUM   sitaGLIPtin  (JANUVIA ) 100 MG tablet Take 1 tablet (100 mg total) by mouth daily.   spironolactone  (ALDACTONE ) 25 MG tablet Take 1 tablet (25 mg total) by mouth daily.   No  facility-administered encounter medications on file as of 05/09/2023.     Review of Systems  Review of Systems  No chest pain exertion no orthopnea or PND.  Comprehensive review of systems otherwise negative. Physical Exam  BP (!) 156/77 (BP Location: Right Arm, Patient Position: Sitting, Cuff Size: Normal)   Pulse 62   Ht 6\' 2"  (1.88 m)   Wt 156 lb 6.4 oz (70.9 kg)   SpO2 94%   BMI 20.08 kg/m   Wt Readings from Last 5 Encounters:  05/09/23 156 lb 6.4 oz (70.9 kg)  04/26/23 156 lb 12.8 oz (71.1 kg)  03/15/23 168 lb (  76.2 kg)  03/14/23 167 lb 3.2 oz (75.8 kg)  03/01/23 169 lb (76.7 kg)    BMI Readings from Last 5 Encounters:  05/09/23 20.08 kg/m  04/26/23 20.13 kg/m  03/15/23 21.57 kg/m  03/14/23 19.83 kg/m  03/01/23 20.04 kg/m     Physical Exam General: Well-appearing, sitting in chair Eyes: EOMI, no icterus Pulmonary: Mildly diminished in the bases, otherwise clear, normal work of breathing Cardiovascular: Warm, minimal to trace pitting edema of the ankle Abdomen: Nondistended MSK: No synovitis, no acute effusion Neuro: Ambulates with the assistance of a rollator, no focal deficits Psych: Normal mood, full affect   Assessment & Plan:   Bilateral pleural effusions: Have recurred, somewhat loculated likely related to chronicity.  Initial pleural effusion in 2022, transudative.  Pleural effusions reappear 11/2022.  Persist and lead to thoracentesis 02/2023.  Transudate again.  This is in the setting of serially elevated BNP, TTE with elevated right atrial pressures as well as mitral valve regurgitation and diastolic dysfunction, TEE demonstrated dilated left atrium.  All data suggest bilateral pleural effusions are result of pulmonary venous hypertension related to cardiogenic volume overload.  Likely exacerbated at times of hypoalbuminemia as demonstrated on labs.  He denies any therapeutic benefit to thoracentesis.  I see no reason to repeat thoracentesis unless  symptoms change.  Or if third opinion on diagnosis, diagnostic thoracentesis is desired.  We discussed at length that the mainstay of treatment for this would be adequate nutrition and protein supplementation as well as titration of his diuretic therapy.  Encouraged ongoing cardiology follow-up.   Return if symptoms worsen or fail to improve, for f/u Dr. Marygrace Snellen.   Guerry Leek, MD 05/09/2023   This appointment required 48 minutes of patient care (this includes precharting, chart review, review of results, face-to-face care, etc.).

## 2023-05-09 NOTE — Patient Instructions (Signed)
 As we discussed, the evidence points to volume overload related to the heart as being the primary issue with the fluid around the lungs.  Given the thoracentesis in the past has not improved your breathing, I see no reason to repeated.  And lastly your symptoms or shortness of breath would worsen we could reconsider.  I suspect he will need increased doses of Lasix  at times to help control this.  We also discussed the importance of nutrition, particularly getting enough protein to help minimize fluid accumulation.  Return to clinic as needed

## 2023-05-10 ENCOUNTER — Other Ambulatory Visit: Payer: Self-pay | Admitting: Physician Assistant

## 2023-05-10 DIAGNOSIS — F3341 Major depressive disorder, recurrent, in partial remission: Secondary | ICD-10-CM

## 2023-05-14 ENCOUNTER — Encounter: Payer: Self-pay | Admitting: Adult Health

## 2023-05-14 NOTE — Telephone Encounter (Signed)
 [name]) called to schedule appt. Pt [name] [name]) son stated Pt has been having episode of leaving his apartment at 1:00 in the morning and wondering off. Neighbors  had to guide him back home . PT son is concern about Pt . Would like an earlier appt.

## 2023-05-14 NOTE — Telephone Encounter (Signed)
 error

## 2023-05-14 NOTE — Telephone Encounter (Signed)
 Please see pt msg and advise anything needed at this time

## 2023-05-14 NOTE — Telephone Encounter (Signed)
 Please see pt son response and advise

## 2023-05-15 ENCOUNTER — Encounter: Payer: Self-pay | Admitting: Cardiovascular Disease

## 2023-05-23 NOTE — Progress Notes (Deleted)
 Brigitte Canard, PA-C [address] [place], [place]  [address] Phone: [phone]   Primary Care Physician: [name], Deleta Felix, PA-C  Primary Gastroenterologist:  Brigitte Canard, PA-C / ***  Chief Complaint: Follow-up chronic diarrhea, anemia, history of colon polyps       HPI:   [name] [name] is a 68 y.o. male, previous pt. Of Dr. Sandrea Cruel returns for 66-month follow-up of diarrhea.  He last saw Sharlene Dawn, PA-C in our office 01/18/2023 to evaluate chronic diarrhea.  He has history of multiple large adenomatous colon polyps removed with colonoscopy 03/2019.  Stool studies were ordered 01/2023, but not completed.  PMH: A-fib on Eliquis , HTN, CVA, CHF, CAD, PVD, uncontrolled type 2 diabetes, Diabetic Food Ulcer, chronic anemia, hallucinations, chronic diastolic heart failure with recurrent pleural effusions, malnutrition, hypoalbuminemia, hx Aortic Aneurysm repair, Hx CABG.  Was hospitalized 11/2022 for hypertensive crisis and CVA.  Has pulmonary venous hypertension with cardiogenic volume overload.  Underwent thoracentesis 02/2023 for recurrent pleural effusions (mainstay of treatment is good nutrition with protein and diuretic therapy).   Walks with a walker.  Followed by Cardiology, Endocrinology, Pulmonology, Podiatry, and PCP.  02/2023 echo LVEF 60 to 65%.  03/2019 colonoscopy: - Four 12 to 17 mm tubular adenoma polyps in the descending colon and in the transverse colon. - Four 7 to 8 mm tubular adenoma polyps in the sigmoid colon, removed from transverse and ascending colon. - One 18 mm tubular adenoma polyp removed from sigmoid colon. - Repeat in 2 years  04/26/23 lab: Stable improved hemoglobin 11.1, BUN 22, creatinine 0.97, glucose 228, alk phos 167, albumin 3.0, total iron 45, iron saturation 13.1, ferritin 54.  Current Outpatient Medications  Medication Sig Dispense Refill   acetaminophen  (TYLENOL ) 500 MG tablet Take 500 mg by mouth as needed for mild pain or moderate  pain.     amLODipine  (NORVASC ) 2.5 MG tablet TAKE 1 TABLET BY MOUTH EVERY DAY 90 tablet 0   atorvastatin  (LIPITOR) 40 MG tablet TAKE 1 TABLET BY MOUTH EVERY DAY 90 tablet 1   brimonidine (ALPHAGAN) 0.2 % ophthalmic solution 1 drop 2 (two) times daily.     Cholecalciferol  25 MCG (1000 UT) capsule Take 1,000 Units by mouth daily.     Continuous Glucose Sensor (DEXCOM G7 SENSOR) MISC 1 Device by Does not apply route as directed. 9 each 3   Continuous Glucose Transmitter (DEXCOM G6 TRANSMITTER) MISC as directed.     ELIQUIS  5 MG TABS tablet TAKE 1 TABLET BY MOUTH TWICE A DAY 180 tablet 1   furosemide  (LASIX ) 20 MG tablet TAKE 1 TABLET BY MOUTH EVERY DAY 90 tablet 1   glipiZIDE  (GLUCOTROL ) 10 MG tablet Take 1 tablet (10 mg total) by mouth daily before breakfast. 90 tablet 3   loperamide  (IMODIUM ) 2 MG capsule Take 1 capsule (2 mg total) by mouth 4 (four) times daily as needed for diarrhea or loose stools. 30 capsule 1   loratadine  (CLARITIN ) 10 MG tablet Take 1 tablet (10 mg total) by mouth daily. 30 tablet 1   mirtazapine  (REMERON ) 15 MG tablet TAKE 1 TABLET BY MOUTH EVERYDAY AT BEDTIME 90 tablet 1   Multiple Vitamins-Minerals (MULTIVITAMIN WITH MINERALS) tablet Take 1 tablet by mouth daily.     PARoxetine  (PAXIL ) 20 MG tablet TAKE 1 TABLET ONCE A DAY FOR DEPRESSION 90 tablet 1   QUEtiapine  (SEROQUEL ) 25 MG tablet GIVE 1 TABLET BY MOUTH AT BEDTIME FOR DELIRIUM 90 tablet 0   sitaGLIPtin  (  JANUVIA ) 100 MG tablet Take 1 tablet (100 mg total) by mouth daily. 90 tablet 3   spironolactone  (ALDACTONE ) 25 MG tablet Take 1 tablet (25 mg total) by mouth daily. 90 tablet 3   No current facility-administered medications for this visit.    Allergies as of 05/24/2023 - Review Complete 05/09/2023  Allergen Reaction Noted   Wool alcohol [lanolin] Hives and Itching 10/23/2021    Past Medical History:  Diagnosis Date   Anxiety    Arthritis    Cataract    CHF (congestive heart failure) (HCC)    Constipation     pt states goes EOD- hard stools    Coronary artery disease    Depression    Diabetes mellitus without complication (HCC)    Diabetic retinopathy (HCC)    DKA (diabetic ketoacidoses)    Hyperlipidemia    Hypertension    off meds   Hypertensive retinopathy    Lesion of ulnar nerve, left upper limb 07/12/2017   Non-intractable vomiting without nausea 06/10/2018   Postherpetic neuralgia 08/11/2021   Speculative diagnosis   Psoriasis    Tubular adenoma of colon 03/2019    Past Surgical History:  Procedure Laterality Date   ABDOMINAL AORTOGRAM W/LOWER EXTREMITY N/A 08/30/2020   Procedure: ABDOMINAL AORTOGRAM W/LOWER EXTREMITY;  Surgeon: Margherita Shell, MD;  Location: [facility];  Service: Cardiovascular;  Laterality: N/A;   ABDOMINAL AORTOGRAM W/LOWER EXTREMITY N/A 11/15/2020   Procedure: ABDOMINAL AORTOGRAM W/LOWER EXTREMITY;  Surgeon: Margherita Shell, MD;  Location: [facility];  Service: Cardiovascular;  Laterality: N/A;   ANKLE SURGERY     right   CARDIAC CATHETERIZATION  09/25/2001   COLONOSCOPY     ~10 yr ago- normal  per pt    CORONARY ARTERY BYPASS GRAFT  09/30/2001   CABG x 4   IR THORACENTESIS ASP PLEURAL SPACE W/IMG GUIDE  05/19/2020   IR THORACENTESIS ASP PLEURAL SPACE W/IMG GUIDE  02/04/2023   LAPAROSCOPIC INGUINAL HERNIA REPAIR Bilateral 02/09/2010   MASS EXCISION Left 03/12/2013   Procedure: LEFT INDEX EXCISION MASS AND DEBRIDMENT DISTAL INTERPHALANGEAL JOINT;  Surgeon: Milagros Alf, MD;  Location: [facility];  Service: Orthopedics;  Laterality: Left;   PERIPHERAL VASCULAR BALLOON ANGIOPLASTY Left 08/30/2020   Procedure: PERIPHERAL VASCULAR BALLOON ANGIOPLASTY;  Surgeon: Margherita Shell, MD;  Location: [facility];  Service: Cardiovascular;  Laterality: Left;  Peroneal   TRANSESOPHAGEAL ECHOCARDIOGRAM (CATH LAB) N/A 11/26/2022   Procedure: TRANSESOPHAGEAL ECHOCARDIOGRAM;  Surgeon: Wendie Hamburg, MD;  Location: [facility] INVAS[facility]  CV LAB;  Service: Cardiovascular;  Laterality: N/A;    Review of Systems:    All systems reviewed and negative except where noted in HPI.    Physical Exam:  There were no vitals taken for this visit. No LMP for male patient.  General: Well-nourished, well-developed in no acute distress.  Lungs: Clear to auscultation bilaterally. Non-labored. Heart: Regular rate and rhythm, no murmurs rubs or gallops.  Abdomen: Bowel sounds are normal; Abdomen is Soft; No hepatosplenomegaly, masses or hernias;  No Abdominal Tenderness; No guarding or rebound tenderness. Neuro: Alert and oriented x 3.  Grossly intact.  Psych: Alert and cooperative, normal mood and affect.   Imaging Studies: No results found.  Labs: CBC    Component Value Date/Time   WBC 4.6 04/26/2023 0934   RBC 4.22 04/26/2023 0934   HGB 11.1 (L) 04/26/2023 0934   HCT 34.6 (L) 04/26/2023 0934   PLT 214.0 04/26/2023 0934  MCV 81.9 04/26/2023 0934   MCH 29.7 02/07/2023 0251   MCHC 32.3 04/26/2023 0934   RDW 16.6 (H) 04/26/2023 0934   LYMPHSABS 1.0 04/26/2023 0934   MONOABS 0.4 04/26/2023 0934   EOSABS 0.1 04/26/2023 0934   BASOSABS 0.0 04/26/2023 0934    CMP     Component Value Date/Time   NA 138 04/26/2023 0934   K 3.6 04/26/2023 0934   CL 96 04/26/2023 0934   CO2 35 (H) 04/26/2023 0934   GLUCOSE 228 (H) 04/26/2023 0934   BUN 22 04/26/2023 0934   CREATININE 0.97 04/26/2023 0934   CALCIUM  8.4 04/26/2023 0934   PROT 6.0 04/26/2023 0934   ALBUMIN 3.0 (L) 04/26/2023 0934   AST 14 04/26/2023 0934   ALT 9 04/26/2023 0934   ALKPHOS 167 (H) 04/26/2023 0934   BILITOT 0.5 04/26/2023 0934   GFRNONAA >60 02/07/2023 0251   GFRAA >60 06/09/2018 1647       Assessment and Plan:   [name] [name] is a 68 y.o. y/o male ***    Brigitte Canard, PA-C  Follow up ***

## 2023-05-24 ENCOUNTER — Ambulatory Visit: Admitting: Physician Assistant

## 2023-05-30 ENCOUNTER — Other Ambulatory Visit: Payer: Self-pay | Admitting: Physician Assistant

## 2023-06-05 ENCOUNTER — Telehealth: Payer: Self-pay | Admitting: Gastroenterology

## 2023-06-05 NOTE — Telephone Encounter (Signed)
 Pt scheduled to see Everett Hitt NP 06/06/23@9 :30am. Please notiify son/pt of appt.

## 2023-06-05 NOTE — Telephone Encounter (Signed)
 Inbound call from patient's son stating patient has been having severe diarrhea for the last week. States he has given patient anti diarrhea otc medication but does not seem to be helping. Patient's is currently scheduled for 7/14. Requesting a call to discuss other alternatives to help in the meantime. Please advise, thank you

## 2023-06-05 NOTE — Telephone Encounter (Signed)
 Called patient's son to advise of appointment. Left detailed voicemail and asked for son to call back to confirm appointment.

## 2023-06-06 ENCOUNTER — Telehealth: Payer: Self-pay

## 2023-06-06 ENCOUNTER — Ambulatory Visit: Admitting: Nurse Practitioner

## 2023-06-06 ENCOUNTER — Encounter: Payer: Self-pay | Admitting: Nurse Practitioner

## 2023-06-06 ENCOUNTER — Other Ambulatory Visit (INDEPENDENT_AMBULATORY_CARE_PROVIDER_SITE_OTHER)

## 2023-06-06 VITALS — BP 150/70 | HR 64 | Ht 71.0 in | Wt 152.0 lb

## 2023-06-06 DIAGNOSIS — K529 Noninfective gastroenteritis and colitis, unspecified: Secondary | ICD-10-CM

## 2023-06-06 DIAGNOSIS — R112 Nausea with vomiting, unspecified: Secondary | ICD-10-CM

## 2023-06-06 DIAGNOSIS — Z860101 Personal history of adenomatous and serrated colon polyps: Secondary | ICD-10-CM

## 2023-06-06 DIAGNOSIS — R634 Abnormal weight loss: Secondary | ICD-10-CM

## 2023-06-06 DIAGNOSIS — D509 Iron deficiency anemia, unspecified: Secondary | ICD-10-CM

## 2023-06-06 DIAGNOSIS — Z7901 Long term (current) use of anticoagulants: Secondary | ICD-10-CM

## 2023-06-06 DIAGNOSIS — I639 Cerebral infarction, unspecified: Secondary | ICD-10-CM

## 2023-06-06 DIAGNOSIS — F17290 Nicotine dependence, other tobacco product, uncomplicated: Secondary | ICD-10-CM

## 2023-06-06 DIAGNOSIS — D649 Anemia, unspecified: Secondary | ICD-10-CM

## 2023-06-06 DIAGNOSIS — I4891 Unspecified atrial fibrillation: Secondary | ICD-10-CM

## 2023-06-06 DIAGNOSIS — R748 Abnormal levels of other serum enzymes: Secondary | ICD-10-CM

## 2023-06-06 LAB — GAMMA GT: GGT: 29 U/L (ref 7–51)

## 2023-06-06 LAB — VITAMIN B12: Vitamin B-12: 341 pg/mL (ref 211–911)

## 2023-06-06 LAB — HEPATIC FUNCTION PANEL
ALT: 8 U/L (ref 0–53)
AST: 16 U/L (ref 0–37)
Albumin: 3.3 g/dL — ABNORMAL LOW (ref 3.5–5.2)
Alkaline Phosphatase: 147 U/L — ABNORMAL HIGH (ref 39–117)
Bilirubin, Direct: 0.2 mg/dL (ref 0.0–0.3)
Total Bilirubin: 0.6 mg/dL (ref 0.2–1.2)
Total Protein: 7.1 g/dL (ref 6.0–8.3)

## 2023-06-06 MED ORDER — NA SULFATE-K SULFATE-MG SULF 17.5-3.13-1.6 GM/177ML PO SOLN: 1.0000 | Freq: Once | ORAL | 0 refills | Status: AC

## 2023-06-06 NOTE — Progress Notes (Signed)
 06/06/2023 [name] [name] [id number] [birth year]   Chief Complaint: Diarrhea  History of Present Illness: [name] [name]. [place] is a 68 year old male with a history of anxiety, hypertension, hyperlipidemia, coronary artery disease status post CABG 2003, diastolic CHF (LV EF 60 - 65 % per TEE), atrial fibrillation on Eliquis , CVA 11/2022, PAD, AAA, IDA, diabetes mellitus type 2, diabetic retinopathy, chronic diarrhea and colon polyps.  He is known by Dr. General Kenner. He was last seen in office by [name] due to chronic nausea, vomiting, diarrhea, IDA  and a 25 lb weight loss 01/18/2023. An EGD and colonoscopy were deferred due to concerns regarding prior hospitalization with CVA on Eliquis  11/2022 and acute CHF, planned on scheduling procedures 6 months post CVA. He presents today to schedule an EGD and colonoscopy. He is accompanied by his son Melodie Spry. He continues to have nonbloody diarrhea 2 to 3 times daily. No bloody or black stools. Infrequent constipation. Less nausea and vomiting, last vomited 5 or 6 days ago. No hematemesis. No abdominal pain. No heartburn or dysphagia.   He was admitted to the hospital 02/01/2023 - 02/07/2023 due to SOB, LE edema, weight gain and visual hallucination, diagnosed with acute diastolic CHF, hypertensive urgency and subacute stroke. BNP 458.  Troponin 29> 27. CXR showed moderate bilateral pleural effusion, right > left.  CT head without acute finding. MRI brain showed small foci of early subacute ischemia within the right frontal white matter and chronic microhemorrhage in the left cerebellum and both temporal lobes.  Small foci of early subacute ischemia within the right frontal white matter. CT angio head and neck without significant finding. Neurology was consulted. S/P left thoracocentesis with removal of 1.1 L transudative fluid on 2/2.  Right thoracocentesis with removal of 1.8 L on 2/3.  Diuretics and antihypertensive meds held due to hypotension, AKI  and hyperkalemia. Eventually, AKI, hyperkalemia and hypotension resolved.  He was discharged on p.o. Lasix  20 mg daily as needed.  Discontinued home Losartan  and Aldactone .  Last seen by pulmonologist Dr. Marygrace Snellen 05/09/2023 who assessed his bilateral pleural effusions are a result of pulmonary venous hypertension related to cardiogenic volume overload. Likely exacerbated at times of hypoalbuminemia as demonstrated on labs. Patient denied any therapeutic benefit to thoracentesis therefore Dr. Marygrace Snellen did not see any reason to repeat a thoracentesis unless his symptoms change. Treatment focused on adequate nutrition, increased protein intake and diuretic therapy.       Latest Ref Rng & Units 04/26/2023    9:34 AM 03/01/2023   11:57 AM 02/07/2023    2:51 AM  CBC  WBC 4.0 - 10.5 K/uL 4.6  6.5  6.2   Hemoglobin 13.0 - 17.0 g/dL 11.9  9.3  9.9   Hematocrit 39.0 - 52.0 % 34.6  28.4  30.0   Platelets 150.0 - 400.0 K/uL 214.0  292.0  236     Iron 45.     Latest Ref Rng & Units 04/26/2023    9:34 AM 03/01/2023   11:57 AM 02/07/2023    2:51 AM  CMP  Glucose 70 - 99 mg/dL 147  829  562   BUN 6 - 23 mg/dL 22  18  40   Creatinine 0.40 - 1.50 mg/dL 1.30  8.65  7.84   Sodium 135 - 145 mEq/L 138  141  134   Potassium 3.5 - 5.1 mEq/L 3.6  4.1  4.7   Chloride 96 - 112 mEq/L 96  102  94   CO2 19 -  32 mEq/L 35  33  31   Calcium  8.4 - 10.5 mg/dL 8.4  8.2  8.4   Total Protein 6.0 - 8.3 g/dL 6.0  6.2    Total Bilirubin 0.2 - 1.2 mg/dL 0.5  0.5    Alkaline Phos 39 - 117 U/L 167  157    AST 0 - 37 U/L 14  13    ALT 0 - 53 U/L 9  9      Chest CT 04/12/2023:   IMPRESSION: 1. Moderate size right-greater-than-left layering pleural effusions, similar in size to recent chest x-rays and CT of abdomen and pelvis from 11/24/2022. Some of the fluid tracks subpulmonic on both sides and could be partially loculated. 2. Adjacent compressive collapse or consolidation in the basal segments of both lower lobes, and  partial collapse of the lower lobe superior segments. 3. No interstitial edema is seen. 4. Enlarged pulmonary trunk indicating arterial hypertension. 5. Aortic and coronary artery atherosclerosis.  Status post CABG. 6. Stable borderline prominent mediastinal lymph nodes. 7. Stomach distended with food substrate. Query recent ingestion or impaired gastric emptying. 8. Osteopenia and degenerative change.  Aortic Atherosclerosis  PAST GI PROCEDURES:  Colonoscopy 03/2019 - Four 12 to 17 mm polyps in the descending colon and in the transverse colon, removed with a hot snare. Resected and retrieved.  - Four 7 to 8 mm polyps in the sigmoid colon, in the transverse colon and in the ascending colon, removed with a cold snare. Resected and retrieved. - One 18 mm polyp in the sigmoid colon, removed with a hot snare. Resected and retrieved. - Internal hemorrhoids. - The examination was otherwise normal on direct and retroflexion views. Path: tubular adenomas  Current Outpatient Medications on File Prior to Visit  Medication Sig Dispense Refill   acetaminophen  (TYLENOL ) 500 MG tablet Take 500 mg by mouth as needed for mild pain or moderate pain.     amLODipine  (NORVASC ) 2.5 MG tablet TAKE 1 TABLET BY MOUTH EVERY DAY 90 tablet 0   atorvastatin  (LIPITOR) 40 MG tablet TAKE 1 TABLET BY MOUTH EVERY DAY 90 tablet 1   brimonidine (ALPHAGAN) 0.2 % ophthalmic solution 1 drop 2 (two) times daily.     Cholecalciferol  25 MCG (1000 UT) capsule Take 1,000 Units by mouth daily.     Continuous Glucose Sensor (DEXCOM G7 SENSOR) MISC 1 Device by Does not apply route as directed. 9 each 3   Continuous Glucose Transmitter (DEXCOM G6 TRANSMITTER) MISC as directed.     ELIQUIS  5 MG TABS tablet TAKE 1 TABLET BY MOUTH TWICE A DAY 180 tablet 1   furosemide  (LASIX ) 20 MG tablet TAKE 1 TABLET BY MOUTH EVERY DAY 90 tablet 1   glipiZIDE  (GLUCOTROL ) 10 MG tablet Take 1 tablet (10 mg total) by mouth daily before breakfast. 90  tablet 3   loperamide  (IMODIUM ) 2 MG capsule Take 1 capsule (2 mg total) by mouth 4 (four) times daily as needed for diarrhea or loose stools. 30 capsule 1   loratadine  (CLARITIN ) 10 MG tablet TAKE 1 TABLET BY MOUTH EVERY DAY 90 tablet 1   mirtazapine  (REMERON ) 15 MG tablet TAKE 1 TABLET BY MOUTH EVERYDAY AT BEDTIME 90 tablet 1   Multiple Vitamins-Minerals (MULTIVITAMIN WITH MINERALS) tablet Take 1 tablet by mouth daily.     PARoxetine  (PAXIL ) 20 MG tablet TAKE 1 TABLET ONCE A DAY FOR DEPRESSION 90 tablet 1   QUEtiapine  (SEROQUEL ) 25 MG tablet GIVE 1 TABLET BY MOUTH AT BEDTIME FOR DELIRIUM 90  tablet 0   sitaGLIPtin  (JANUVIA ) 100 MG tablet Take 1 tablet (100 mg total) by mouth daily. 90 tablet 3   spironolactone  (ALDACTONE ) 25 MG tablet Take 1 tablet (25 mg total) by mouth daily. 90 tablet 3   No current facility-administered medications on file prior to visit.   Allergies  Allergen Reactions   Wool Alcohol [Lanolin] Hives and Itching    Wool fabric   Current Medications, Allergies, Past Medical History, Past Surgical History, Family History and Social History were reviewed in [employer] record.  Review of Systems:   Constitutional: + Weight loss.  Respiratory: Negative for shortness of breath.   Cardiovascular: Negative for chest pain, palpitations and leg swelling.  Gastrointestinal: See HPI.  Musculoskeletal: Negative for back pain or muscle aches.  Neurological: Negative for dizziness, headaches or paresthesias.   Physical Exam: BP (!) 150/70 (BP Location: Right Arm, Patient Position: Sitting, Cuff Size: Normal)   Pulse 64   Ht 5\' 11"  (1.803 m)   Wt 152 lb (68.9 kg)   BMI 21.20 kg/m   Wt Readings from Last 3 Encounters:  06/06/23 152 lb (68.9 kg)  05/09/23 156 lb 6.4 oz (70.9 kg)  04/26/23 156 lb 12.8 oz (71.1 kg)    General: Thin 67 year old male in no acute distress. Head: Normocephalic and atraumatic. Eyes: No scleral icterus. Conjunctiva pink  . Ears: Normal auditory acuity. Mouth: Dentition intact. No ulcers or lesions.  Lungs: Clear throughout to auscultation. Heart: Regular rate and rhythm, no murmur. Abdomen: Soft, nontender and nondistended. No masses or hepatomegaly. Normal bowel sounds x 4 quadrants.  Rectal: Deferred.  Musculoskeletal: Symmetrical with no gross deformities. Extremities: No edema. Neurological: Alert oriented x 4. No focal deficits.  Psychological: Alert and cooperative. Normal mood and affect  Assessment and Recommendations:  Chronic Diarrhea -Pancreatic elastase level  -Diagnostic colonoscopy at [facility] to rule out microscopic colitis/IBD benefits and risks discussed including risk with sedation, risk of bleeding, perforation and infection. I explained to patient and son that he is at higher risk for sedation/procedure complications secondary to his multiple co-morbidities including CAD, CHF, Afib, CVA and recurrent bilateral pleural effusions. Dr. General Kenner to verify case prior to patient proceeding with EGD/colonoscopy. -Our office will contact cardiologist  Dr. Rolm Clos to verify Eliquis  hold instructions prior to proceeding with a colonoscopy  -Imodium  one tab twice daily as needed, hold if no BM in 24 hours    Iron Deficiency Anemia. Hg 11.1.  Iron 45, saturation ratios 13.1 and ferritin 54.2 on 04/26/2023. -EGD and colonoscopy at [facility] benefits and risks discussed including risk with sedation, risk of bleeding, perforation and infection. Endoscopic procedures to be scheduled in the hospital setting secondary to multiple comorbidities.   Chronic nausea/vomiting. Last vomited one week ago. No hematemesis.  -See plan above    Atrial Fibrillation on Eliquis   -Our office will contact cardiologist Dr. Rolm Clos to verify Eliquis  hold instructions prior to proceeding with EGD and colonoscopy    CVA. Previously admitted to the hospital [facility] stroke in the setting of noncompliance with Eliquis  and hypertensive  urgency 11/2022. Admitted to the hospital 02/01/2023 - 02/07/2023 with acute diastolic CHF, bilateral pleural effusions s/p thoracenteses, hypertensive urgency and subacute stroke.  - Any endoscopic evaluation would have to be minimum 6 months from November 2024 (May 2025 at the very earliest) in a hospital setting and he would have to have a blood thinner cease.  Patient is high risk for procedures.  CAD s/p CABG  CHF. EF 60-65%.  Recurrent Bilateral pleural effusions s/p right and left thoracentesis 02/2023  Elevated Alk phos level -Check hepatic panel, GGT  History of 9 large tubular adenomatous polyps measuring 7 to 18 mm per colonoscopy 09/2019 -Colonoscopy as ordered above    DM type II

## 2023-06-06 NOTE — Telephone Encounter (Signed)
  [name] [name] 16-Oct-1955 [id number]  06/06/2023   [name], Deleta Felix, PA-C  We have scheduled the above named patient for a(n) Endoscopy and Colonoscopy procedure. Our records show that (s)he is on anticoagulation therapy.  Please advise as to whether the patient may come off their therapy of Eliquis  2 days prior to their procedure which is scheduled for 07/25/23.  Please route your response to Lianne Redo, CMA or fax response to [phone].  Sincerely,   [facility]

## 2023-06-06 NOTE — Patient Instructions (Addendum)
 Your provider has requested that you go to the basement level for lab work before leaving today. Press "B" on the elevator. The lab is located at the first door on the left as you exit the elevator.  Please purchase the following medications over the counter and take as directed: Imodium  one tablet by mouth daily and Lactaid one to two tablets with dairy products  You have been scheduled for an Endoscopy and Colonoscopy. Please follow the written instructions given to you at your visit today.  If you use inhalers (even only as needed), please bring them with you on the day of your procedure.  DO NOT TAKE 7 DAYS PRIOR TO TEST- Trulicity (dulaglutide) Ozempic, Wegovy (semaglutide) Mounjaro (tirzepatide) Bydureon Bcise (exanatide extended release)  DO NOT TAKE 1 DAY PRIOR TO YOUR TEST Rybelsus (semaglutide) Adlyxin (lixisenatide) Victoza (liraglutide) Byetta (exanatide) ___________________________________________________________________________  Please follow up sooner if symptoms increase or worsen __________________________________________________________________________  Due to recent changes in healthcare laws, you may see the results of your imaging and laboratory studies on MyChart before your provider has had a chance to review them.  We understand that in some cases there may be results that are confusing or concerning to you. Not all laboratory results come back in the same time frame and the provider may be waiting for multiple results in order to interpret others.  Please give us  48 hours in order for your provider to thoroughly review all the results before contacting the office for clarification of your results.   Thank you for trusting me with your gastrointestinal care!   Everett Hitt, NP _______________________________________________________  If your blood pressure at your visit was 140/90 or greater, please contact your primary care physician to follow up on  this.  _______________________________________________________  If you are age 74 or older, your body mass index should be between 23-30. Your Body mass index is 21.2 kg/m. If this is out of the aforementioned range listed, please consider follow up with your Primary Care Provider.  If you are age 37 or younger, your body mass index should be between 19-25. Your Body mass index is 21.2 kg/m. If this is out of the aformentioned range listed, please consider follow up with your Primary Care Provider.   ________________________________________________________  The [facility] providers would like to encourage you to use MYCHART to communicate with providers for non-urgent requests or questions.  Due to long hold times on the telephone, sending your provider a message by [facility] may be a faster and more efficient way to get a response.  Please allow 48 business hours for a response.  Please remember that this is for non-urgent requests.  _______________________________________________________

## 2023-06-07 ENCOUNTER — Telehealth: Payer: Self-pay

## 2023-06-07 NOTE — Telephone Encounter (Signed)
 I spoke with the patient's son to inform him that his father should hold Eliquis  2 days prior to his  procedure and was advised to give me a call back with any questions or concerns. He is aware and understands.

## 2023-06-10 ENCOUNTER — Ambulatory Visit: Payer: Self-pay | Admitting: Nurse Practitioner

## 2023-06-10 NOTE — Progress Notes (Signed)
 Agree with assessment and plan as outlined. If you feel he has improved with time and stable for these exams we can proceed and do them at the hospital, with approval from cardiology to hold Eliquis .

## 2023-06-11 ENCOUNTER — Telehealth: Payer: Self-pay

## 2023-06-11 NOTE — Telephone Encounter (Signed)
 [facility] Pre-operative Risk Assessment     Request for surgical clearance:     Endoscopy Procedure  What type of surgery is being performed?     Colonoscopy and Endoscopy   When is this surgery scheduled?     07/25/23  What type of clearance is required ?   Pharmacy  Are there any medications that need to be held prior to surgery and how long? Eliquis  2 days  Practice name and name of physician performing surgery?      [facility]  What is your office phone and fax number?      Phone- [phone]  Fax- [phone]  Anesthesia type (None, local, MAC, general) ?       MAC   Please route your response to Lianne Redo, CMA

## 2023-06-12 NOTE — Progress Notes (Signed)
 At the time of office visit 06/06/2023, patient's clinical status was stable.

## 2023-06-14 ENCOUNTER — Ambulatory Visit: Admitting: Emergency Medicine

## 2023-06-17 NOTE — Telephone Encounter (Signed)
 Patient with diagnosis of Afib on Eliquis  for anticoagulation.    Procedure:  Colonoscopy and Endoscopy  Date of procedure: 07/25/23   CHA2DS2-VASc Score = 5   This indicates a 7.2% annual risk of stroke. The patient's score is based upon: CHF History: 1 HTN History: 1 Diabetes History: 1 Stroke History: 0 Vascular Disease History: 1 Age Score: 1 Gender Score: 0   CrCl 69 mL/min Platelet count 214   Patient has not had an Afib/aflutter ablation within the last 3 months or DCCV within the last 30 days   Per office protocol, patient can hold Eliquis  for 2 days prior to procedure.   Patient will not need bridging with Lovenox (enoxaparin) around procedure.  **This guidance is not considered finalized until pre-operative APP has relayed final recommendations.**

## 2023-06-17 NOTE — Telephone Encounter (Signed)
   Name: [name] [name]  DOB: [birth year]  MRN: [id number]   Primary Cardiologist: Oneil Bigness, MD  Chart reviewed as part of pre-operative protocol coverage.   Per Pharm D, patient may hold Eliquis  for 2 days prior to procedure. Patient will not need bridging with Lovenox around procedure.      I will route this recommendation to the requesting party via Epic fax function and remove from pre-op pool. Please call with questions.  Morey Ar, NP 06/17/2023, 3:15 PM

## 2023-06-18 ENCOUNTER — Other Ambulatory Visit: Payer: Self-pay | Admitting: Physician Assistant

## 2023-06-18 ENCOUNTER — Telehealth: Payer: Self-pay | Admitting: Nurse Practitioner

## 2023-06-18 NOTE — Telephone Encounter (Signed)
see result note

## 2023-06-18 NOTE — Telephone Encounter (Signed)
 Jan it looks like you called.

## 2023-06-18 NOTE — Telephone Encounter (Signed)
 Patient son is returning a call he had received but is not sure who had call him regarding his father. Patient son is requesting a call back. Please advise.

## 2023-06-18 NOTE — Telephone Encounter (Signed)
 Called and left detailed message for patient's son Melodie Spry that he should hold his Eliquis  for 2 days prior to the procedure, no Lovenox bridging will be necessary.  Also left message asking him to call us  back regarding lab result note from [place] (6-16) requesting an update on his father's diarrhea and how that is responding the Imodium  1 tab BID. If not improving she and Dr. General Kenner would like to try another medication.  Will wait for a call back to further discuss

## 2023-06-20 NOTE — Telephone Encounter (Signed)
 Left another message for patient's son, Melodie Spry

## 2023-06-21 ENCOUNTER — Ambulatory Visit: Payer: PPO | Admitting: Internal Medicine

## 2023-06-21 NOTE — Progress Notes (Deleted)
 Name: [name] [name]  Age/ Sex: 68 y.o., male   MRN/ DOB: [id number], [birth year]     PCP: Alda Amas, PA-C   Reason for Endocrinology Evaluation: Type 2 Diabetes Mellitus  Initial Endocrine Consultative Visit: 07/15/2020    PATIENT IDENTIFIER: [name] [name] is a 68 y.o. male with a past medical history of DM, CAD, HTN, PVD, PAF. The patient has followed with Endocrinology clinic since 07/15/2020 for consultative assistance with management of his diabetes.  DIABETIC HISTORY:  [name] [name] was diagnosed with DM in 2011, and started insulin  therapy in 2013 through his previous endocrinologist. His hemoglobin A1c has ranged from 9.2% in 2023, peaking at >15.0% in 2022.  Saw Dr. Washington Hacker last in 04/2021    Insulin  pump was discontinued during hospitalization 11/2022 and was deemed not fit due to noncompliance.  He was discharged on metformin and Januvia   By 01/2023 I discontinued metformin and started him on glipizide  and increase Januvia    SUBJECTIVE:   During the last visit (02/14/2023): A1c 7.8%      Today (06/21/2023): [name] [name] is here for a follow up on diabetes management.  He checks his blood sugars multiple  times daily.    Patient was evaluated by GI for chronic diarrhea 06/2023, scheduled for colonoscopy.  On loperamide   He also had a follow-up with pulmonary 05/2023 for bilateral pleural effusion, no thoracocentesis was recommended Denies nausea, vomiting     HOME DIABETES REGIMEN:  Januvia  100 mg daily  Glipizide  5 mg, 1 tab daily   Statin: yes ACE-I/ARB: yes    CONTINUOUS GLUCOSE MONITORING RECORD INTERPRETATION    Dates of Recording: 1/30-2/12/2023  Sensor description:dexcom  Results statistics:   CGM use % of time 43  Average and SD 268/72  Time in range 0%  % Time Above 180 64  % Time above 250 36  % Time Below target 0      Glycemic patterns summary: Hyperglycemia noted during the day and night   Hyperglycemic episodes  all day and night  Hypoglycemic episodes occurred N/A  Overnight periods: High     DIABETIC COMPLICATIONS: Microvascular complications:  Retinopathy B/L , blind in left eye  Denies: CKD Last Eye Exam: Completed 04/23/2023  Macrovascular complications:  CAD (S/P CABG) Denies: CVA,   HISTORY:  Past Medical History:  Past Medical History:  Diagnosis Date   Anxiety    Arthritis    Cataract    CHF (congestive heart failure) (HCC)    Constipation    pt states goes EOD- hard stools    Coronary artery disease    Depression    Diabetes mellitus without complication (HCC)    Diabetic retinopathy (HCC)    DKA (diabetic ketoacidoses)    Hyperlipidemia    Hypertension    off meds   Hypertensive retinopathy    Lesion of ulnar nerve, left upper limb 07/12/2017   Non-intractable vomiting without nausea 06/10/2018   Postherpetic neuralgia 08/11/2021   Speculative diagnosis   Psoriasis    Tubular adenoma of colon 03/2019   Past Surgical History:  Past Surgical History:  Procedure Laterality Date   ABDOMINAL AORTOGRAM W/LOWER EXTREMITY N/A 08/30/2020   Procedure: ABDOMINAL AORTOGRAM W/LOWER EXTREMITY;  Surgeon: Margherita Shell, MD;  [facility];  Service: Cardiovascular;  Laterality: N/A;   ABDOMINAL AORTOGRAM W/LOWER EXTREMITY N/A 11/15/2020   Procedure: ABDOMINAL AORTOGRAM W/LOWER EXTREMITY;  Surgeon: Margherita Shell, MD;  [facility]: [facility];  Service: Cardiovascular;  Laterality: N/A;   ANKLE SURGERY     right   CARDIAC CATHETERIZATION  09/25/2001   COLONOSCOPY     ~10 yr ago- normal  per pt    CORONARY ARTERY BYPASS GRAFT  09/30/2001   CABG x 4   IR THORACENTESIS ASP PLEURAL SPACE W/IMG GUIDE  05/19/2020   IR THORACENTESIS ASP PLEURAL SPACE W/IMG GUIDE  02/04/2023   LAPAROSCOPIC INGUINAL HERNIA REPAIR Bilateral 02/09/2010   MASS EXCISION Left 03/12/2013   Procedure: LEFT INDEX EXCISION MASS AND DEBRIDMENT DISTAL INTERPHALANGEAL JOINT;  Surgeon:  Milagros Alf, MD;  Location:  SURGERY CENTER;  Service: Orthopedics;  Laterality: Left;   PERIPHERAL VASCULAR BALLOON ANGIOPLASTY Left 08/30/2020   Procedure: PERIPHERAL VASCULAR BALLOON ANGIOPLASTY;  Surgeon: Margherita Shell, MD;  [facility]: [facility];  Service: Cardiovascular;  Laterality: Left;  Peroneal   TRANSESOPHAGEAL ECHOCARDIOGRAM (CATH LAB) N/A 11/26/2022   Procedure: TRANSESOPHAGEAL ECHOCARDIOGRAM;  Surgeon: Wendie Hamburg, MD;  Location: [facility] INVASIVE CV LAB;  Service: Cardiovascular;  Laterality: N/A;   Social History:  reports that he has been smoking cigars. He has never used smokeless tobacco. He reports current alcohol use. He reports that he does not use drugs. Family History:  Family History  Problem Relation Age of Onset   Lymphoma Father    Cancer Paternal Grandmother        Colon Cancer   Colon cancer Paternal Grandmother    Colon polyps Neg Hx    Esophageal cancer Neg Hx    Rectal cancer Neg Hx    Stomach cancer Neg Hx    Diabetes Mellitus I Neg Hx    Pancreatic cancer Neg Hx      HOME MEDICATIONS: Allergies as of 06/21/2023       Reactions   Wool Alcohol [lanolin] Hives, Itching   Wool fabric        Medication List        Accurate as of June 21, 2023  6:50 AM. If you have any questions, ask your nurse or doctor.          acetaminophen  500 MG tablet Commonly known as: TYLENOL  Take 500 mg by mouth as needed for mild pain or moderate pain.   amLODipine  2.5 MG tablet Commonly known as: NORVASC  TAKE 1 TABLET BY MOUTH EVERY DAY   atorvastatin  40 MG tablet Commonly known as: LIPITOR TAKE 1 TABLET BY MOUTH EVERY DAY   brimonidine 0.2 % ophthalmic solution Commonly known as: ALPHAGAN 1 drop 2 (two) times daily.   Cholecalciferol  25 MCG (1000 UT) capsule Take 1,000 Units by mouth daily.   Dexcom G6 Transmitter Misc as directed.   Dexcom G7 Sensor Misc 1 Device by Does not apply route as directed.   Eliquis  5 MG  Tabs tablet Generic drug: apixaban  TAKE 1 TABLET BY MOUTH TWICE A DAY   furosemide  20 MG tablet Commonly known as: LASIX  TAKE 1 TABLET BY MOUTH EVERY DAY   glipiZIDE  10 MG tablet Commonly known as: GLUCOTROL  Take 1 tablet (10 mg total) by mouth daily before breakfast.   loperamide  2 MG capsule Commonly known as: IMODIUM  Take 1 capsule (2 mg total) by mouth 4 (four) times daily as needed for diarrhea or loose stools.   loratadine  10 MG tablet Commonly known as: CLARITIN  TAKE 1 TABLET BY MOUTH EVERY DAY   mirtazapine  15 MG tablet Commonly known as: REMERON  TAKE 1 TABLET BY MOUTH EVERYDAY AT BEDTIME   multivitamin with minerals tablet Take 1 tablet  by mouth daily.   PARoxetine  20 MG tablet Commonly known as: PAXIL  TAKE 1 TABLET ONCE A DAY FOR DEPRESSION   QUEtiapine  25 MG tablet Commonly known as: SEROQUEL  GIVE 1 TABLET BY MOUTH AT BEDTIME FOR DELIRIUM   sitaGLIPtin  100 MG tablet Commonly known as: Januvia  Take 1 tablet (100 mg total) by mouth daily.   spironolactone  25 MG tablet Commonly known as: ALDACTONE  Take 1 tablet (25 mg total) by mouth daily.         OBJECTIVE:   Vital Signs: There were no vitals taken for this visit.  Wt Readings from Last 3 Encounters:  06/06/23 152 lb (68.9 kg)  05/09/23 156 lb 6.4 oz (70.9 kg)  04/26/23 156 lb 12.8 oz (71.1 kg)     Exam: General: Pt appears well and is in NAD  Lungs: Clear with good BS bilat   Heart: RRR   Neuro: MS is good with appropriate affect, pt is alert and Ox3   Dm Foot Exam 09/05/2022 per podiatry      DATA REVIEWED:  Lab Results  Component Value Date   HGBA1C 7.8 (H) 02/03/2023   HGBA1C 9.2 (H) 11/30/2022   HGBA1C 9.8 (A) 11/13/2022    Latest Reference Range & Units 04/26/23 09:34  Sodium 135 - 145 mEq/L 138  Potassium 3.5 - 5.1 mEq/L 3.6  Chloride 96 - 112 mEq/L 96  CO2 19 - 32 mEq/L 35 (H)  Glucose 70 - 99 mg/dL 604 (H)  BUN 6 - 23 mg/dL 22  Creatinine 5.40 - 9.81 mg/dL 1.91   Calcium  8.4 - 10.5 mg/dL 8.4  Alkaline Phosphatase 39 - 117 U/L 167 (H)  Albumin 3.5 - 5.2 g/dL 3.0 (L)  AST 0 - 37 U/L 14  ALT 0 - 53 U/L 9  Total Protein 6.0 - 8.3 g/dL 6.0  Total Bilirubin 0.2 - 1.2 mg/dL 0.5  GFR >47.82 mL/min 79.25  (H): Data is abnormally high (L): Data is abnormally low   Old records , labs and images have been reviewed.    ASSESSMENT / PLAN / RECOMMENDATIONS:   1) Type 2 Diabetes Mellitus, Sub-optimally controlled, With retinopathic , neuropathic, retinopathic and macrovascular  complications - Most recent A1c of 7.8 %. Goal A1c < 7.0 %.     -A1c has trended down from 9.2% to 7.8% while on Januvia  and glipizide  -This is an indication that his pancreas is able to produce endogenous insulin  -I will increase his glipizide  as below, as he continues with hypoglycemia on CGM download -I will upgrade Dexcom from G6-G7  MEDICATIONS: Increase glipizide  10 mg daily Continue Januvia  100 mg daily        EDUCATION / INSTRUCTIONS: BG monitoring instructions: Patient is instructed to check his blood sugars 4 times a day, before each meal and bedtime. Call  Endocrinology clinic if: BG persistently < 70  I reviewed the Rule of 15 for the treatment of hypoglycemia in detail with the patient. Literature supplied.     2) Diabetic complications:  Eye: Does  have known diabetic retinopathy.  Neuro/ Feet: Does have known diabetic peripheral neuropathy .  Renal: Patient does not have known baseline CKD. He   is  on an ACEI/ARB at present.    Follow-up in 4 months  I spent 25 minutes preparing to see the patient by review of recent labs, imaging and procedures, obtaining and reviewing separately obtained history, communicating with the patient/family or caregiver, ordering medications, tests or procedures, and documenting clinical information in the  EHR including the differential Dx, treatment, and any further evaluation and other management   Signed  electronically by: Natale Bail, MD  [facility] Endocrinology  [facility] [address] [place] [place], [place] [address] Phone: [phone] FAX: [phone]   CC: [name], Deleta Felix, PA-C [address] [place] [address] Phone: [phone]  Fax: [phone]  Return to Endocrinology clinic as below: Future Appointments  Date Time Provider Department Center  06/21/2023  7:50 AM Cherysh Epperly, Julian Obey, MD [facility] None  07/15/2023  8:20 AM Brigitte Canard, PA-C [facility]  07/26/2023  8:30 AM [name], Deleta Felix, PA-C [facility] PEC  08/01/2023  8:00 AM Ava Boatman, NP [facility] H&V  12/02/2023  1:40 PM [facility] ANNUAL WELLNESS VISIT 1 [facility] PEC  12/24/2023 11:00 AM Clem Currier, NP [facility] None

## 2023-06-27 ENCOUNTER — Other Ambulatory Visit: Payer: Self-pay | Admitting: Physician Assistant

## 2023-06-27 DIAGNOSIS — I10 Essential (primary) hypertension: Secondary | ICD-10-CM

## 2023-07-01 ENCOUNTER — Ambulatory Visit: Admitting: Internal Medicine

## 2023-07-01 NOTE — Progress Notes (Deleted)
 Name: [name] [name]  Age/ Sex: 68 y.o., male   MRN/ DOB: [id number], [birth year]     PCP: [name] HERO, PA-C   Reason for Endocrinology Evaluation: Type 2 Diabetes Mellitus  Initial Endocrine Consultative Visit: 07/15/2020    PATIENT IDENTIFIER: [name] [name] is a 68 y.o. male with a past medical history of DM, CAD, HTN, PVD, PAF. The patient has followed with Endocrinology clinic since 07/15/2020 for consultative assistance with management of his diabetes.  DIABETIC HISTORY:  [name] was diagnosed with DM in 2011, and started insulin  therapy in 2013 through his previous endocrinologist. His hemoglobin A1c has ranged from 9.2% in 2023, peaking at >15.0% in 2022.  Saw Dr. Kassie last in 04/2021    Insulin  pump was discontinued during hospitalization 11/2022 and was deemed not fit due to noncompliance.  He was discharged on metformin and Januvia   By 01/2023 I discontinued metformin and started him on glipizide  and increase Januvia    SUBJECTIVE:   During the last visit (02/14/2023): A1c 7.8%      Today (07/01/2023): [name] [name] is here for a follow up on diabetes management.  He checks his blood sugars multiple  times daily.    Patient was evaluated by GI for chronic diarrhea 06/2023, scheduled for colonoscopy 07/25/2023.  On loperamide   He also had a follow-up with pulmonary 05/2023 for bilateral pleural effusion, no thoracocentesis was recommended Denies nausea, vomiting     HOME DIABETES REGIMEN:  Januvia  100 mg daily  Glipizide  10 mg, 1 tab daily   Statin: yes ACE-I/ARB: yes    CONTINUOUS GLUCOSE MONITORING RECORD INTERPRETATION    Dates of Recording: 1/30-2/12/2023  Sensor description:dexcom  Results statistics:   CGM use % of time 43  Average and SD 268/72  Time in range 0%  % Time Above 180 64  % Time above 250 36  % Time Below target 0      Glycemic patterns summary: Hyperglycemia noted during the day and night   Hyperglycemic  episodes all day and night  Hypoglycemic episodes occurred N/A  Overnight periods: High     DIABETIC COMPLICATIONS: Microvascular complications:  Retinopathy B/L , blind in left eye  Denies: CKD Last Eye Exam: Completed 04/23/2023  Macrovascular complications:  CAD (S/P CABG) Denies: CVA,   HISTORY:  Past Medical History:  Past Medical History:  Diagnosis Date   Anxiety    Arthritis    Cataract    CHF (congestive heart failure) (HCC)    Constipation    pt states goes EOD- hard stools    Coronary artery disease    Depression    Diabetes mellitus without complication (HCC)    Diabetic retinopathy (HCC)    DKA (diabetic ketoacidoses)    Hyperlipidemia    Hypertension    off meds   Hypertensive retinopathy    Lesion of ulnar nerve, left upper limb 07/12/2017   Non-intractable vomiting without nausea 06/10/2018   Postherpetic neuralgia 08/11/2021   Speculative diagnosis   Psoriasis    Tubular adenoma of colon 03/2019   Past Surgical History:  Past Surgical History:  Procedure Laterality Date   ABDOMINAL AORTOGRAM W/LOWER EXTREMITY N/A 08/30/2020   Procedure: ABDOMINAL AORTOGRAM W/LOWER EXTREMITY;  Surgeon: Serene Gaile ORN, MD;  [facility]: [facility];  Service: Cardiovascular;  Laterality: N/A;   ABDOMINAL AORTOGRAM W/LOWER EXTREMITY N/A 11/15/2020   Procedure: ABDOMINAL AORTOGRAM W/LOWER EXTREMITY;  Surgeon: Serene Gaile ORN, MD;  Location: [facility];  Service: Cardiovascular;  Laterality: N/A;   ANKLE SURGERY     right   CARDIAC CATHETERIZATION  09/25/2001   COLONOSCOPY     ~10 yr ago- normal  per pt    CORONARY ARTERY BYPASS GRAFT  09/30/2001   CABG x 4   IR THORACENTESIS ASP PLEURAL SPACE W/IMG GUIDE  05/19/2020   IR THORACENTESIS ASP PLEURAL SPACE W/IMG GUIDE  02/04/2023   LAPAROSCOPIC INGUINAL HERNIA REPAIR Bilateral 02/09/2010   MASS EXCISION Left 03/12/2013   Procedure: LEFT INDEX EXCISION MASS AND DEBRIDMENT DISTAL INTERPHALANGEAL JOINT;   Surgeon: Franky JONELLE Curia, MD;  Location:  SURGERY CENTER;  Service: Orthopedics;  Laterality: Left;   PERIPHERAL VASCULAR BALLOON ANGIOPLASTY Left 08/30/2020   Procedure: PERIPHERAL VASCULAR BALLOON ANGIOPLASTY;  Surgeon: Serene Gaile ORN, MD;  Location: [facility];  Service: Cardiovascular;  Laterality: Left;  Peroneal   TRANSESOPHAGEAL ECHOCARDIOGRAM (CATH LAB) N/A 11/26/2022   Procedure: TRANSESOPHAGEAL ECHOCARDIOGRAM;  Surgeon: Kate Lonni CROME, MD;  Location: [facility] INVASIVE CV LAB;  Service: Cardiovascular;  Laterality: N/A;   Social History:  reports that he has been smoking cigars. He has never used smokeless tobacco. He reports current alcohol use. He reports that he does not use drugs. Family History:  Family History  Problem Relation Age of Onset   Lymphoma Father    Cancer Paternal Grandmother        Colon Cancer   Colon cancer Paternal Grandmother    Colon polyps Neg Hx    Esophageal cancer Neg Hx    Rectal cancer Neg Hx    Stomach cancer Neg Hx    Diabetes Mellitus I Neg Hx    Pancreatic cancer Neg Hx      HOME MEDICATIONS: Allergies as of 07/01/2023       Reactions   Wool Alcohol [lanolin] Hives, Itching   Wool fabric        Medication List        Accurate as of July 01, 2023  7:10 AM. If you have any questions, ask your nurse or doctor.          acetaminophen  500 MG tablet Commonly known as: TYLENOL  Take 500 mg by mouth as needed for mild pain or moderate pain.   amLODipine  2.5 MG tablet Commonly known as: NORVASC  TAKE 1 TABLET BY MOUTH EVERY DAY   atorvastatin  40 MG tablet Commonly known as: LIPITOR TAKE 1 TABLET BY MOUTH EVERY DAY   brimonidine 0.2 % ophthalmic solution Commonly known as: ALPHAGAN 1 drop 2 (two) times daily.   Cholecalciferol  25 MCG (1000 UT) capsule Take 1,000 Units by mouth daily.   Dexcom G6 Transmitter Misc as directed.   Dexcom G7 Sensor Misc 1 Device by Does not apply route as directed.    Eliquis  5 MG Tabs tablet Generic drug: apixaban  TAKE 1 TABLET BY MOUTH TWICE A DAY   furosemide  20 MG tablet Commonly known as: LASIX  TAKE 1 TABLET BY MOUTH EVERY DAY   glipiZIDE  10 MG tablet Commonly known as: GLUCOTROL  Take 1 tablet (10 mg total) by mouth daily before breakfast.   loperamide  2 MG capsule Commonly known as: IMODIUM  Take 1 capsule (2 mg total) by mouth 4 (four) times daily as needed for diarrhea or loose stools.   loratadine  10 MG tablet Commonly known as: CLARITIN  TAKE 1 TABLET BY MOUTH EVERY DAY   mirtazapine  15 MG tablet Commonly known as: REMERON  TAKE 1 TABLET BY MOUTH EVERYDAY AT BEDTIME   multivitamin with minerals tablet Take 1 tablet  by mouth daily.   Na Sulfate-K Sulfate-Mg Sulfate concentrate 17.5-3.13-1.6 GM/177ML Soln Commonly known as: SUPREP SMARTSIG:1 Kit(s) By Mouth Once   PARoxetine  20 MG tablet Commonly known as: PAXIL  TAKE 1 TABLET ONCE A DAY FOR DEPRESSION   QUEtiapine  25 MG tablet Commonly known as: SEROQUEL  GIVE 1 TABLET BY MOUTH AT BEDTIME FOR DELIRIUM   sitaGLIPtin  100 MG tablet Commonly known as: Januvia  Take 1 tablet (100 mg total) by mouth daily.   spironolactone  25 MG tablet Commonly known as: ALDACTONE  Take 1 tablet (25 mg total) by mouth daily.         OBJECTIVE:   Vital Signs: There were no vitals taken for this visit.  Wt Readings from Last 3 Encounters:  06/06/23 152 lb (68.9 kg)  05/09/23 156 lb 6.4 oz (70.9 kg)  04/26/23 156 lb 12.8 oz (71.1 kg)     Exam: General: Pt appears well and is in NAD  Lungs: Clear with good BS bilat   Heart: RRR   Neuro: MS is good with appropriate affect, pt is alert and Ox3   Dm Foot Exam 09/05/2022 per podiatry      DATA REVIEWED:  Lab Results  Component Value Date   HGBA1C 7.8 (H) 02/03/2023   HGBA1C 9.2 (H) 11/30/2022   HGBA1C 9.8 (A) 11/13/2022    Latest Reference Range & Units 04/26/23 09:34  Sodium 135 - 145 mEq/L 138  Potassium 3.5 - 5.1 mEq/L 3.6   Chloride 96 - 112 mEq/L 96  CO2 19 - 32 mEq/L 35 (H)  Glucose 70 - 99 mg/dL 771 (H)  BUN 6 - 23 mg/dL 22  Creatinine 9.59 - 8.49 mg/dL 9.02  Calcium  8.4 - 10.5 mg/dL 8.4  Alkaline Phosphatase 39 - 117 U/L 167 (H)  Albumin 3.5 - 5.2 g/dL 3.0 (L)  AST 0 - 37 U/L 14  ALT 0 - 53 U/L 9  Total Protein 6.0 - 8.3 g/dL 6.0  Total Bilirubin 0.2 - 1.2 mg/dL 0.5  GFR >39.99 mL/min 79.25  (H): Data is abnormally high (L): Data is abnormally low   Old records , labs and images have been reviewed.    ASSESSMENT / PLAN / RECOMMENDATIONS:   1) Type 2 Diabetes Mellitus, Sub-optimally controlled, With retinopathic , neuropathic, retinopathic and macrovascular  complications - Most recent A1c of 7.8 %. Goal A1c < 7.0 %.     -A1c has trended down from 9.2% to 7.8% while on Januvia  and glipizide  -This is an indication that his pancreas is able to produce endogenous insulin  -I will increase his glipizide  as below, as he continues with hypoglycemia on CGM download -I will upgrade Dexcom from G6-G7  MEDICATIONS: Increase glipizide  10 mg daily Continue Januvia  100 mg daily        EDUCATION / INSTRUCTIONS: BG monitoring instructions: Patient is instructed to check his blood sugars 4 times a day, before each meal and bedtime. Call [facility] if: BG persistently < 70  I reviewed the Rule of 15 for the treatment of hypoglycemia in detail with the patient. Literature supplied.     2) Diabetic complications:  Eye: Does  have known diabetic retinopathy.  Neuro/ Feet: Does have known diabetic peripheral neuropathy .  Renal: Patient does not have known baseline CKD. He   is  on an ACEI/ARB at present.    Follow-up in 4 months  I spent 25 minutes preparing to see the patient by review of recent labs, imaging and procedures, obtaining and reviewing separately  obtained history, communicating with the patient/family or caregiver, ordering medications, tests or procedures, and  documenting clinical information in the EHR including the differential Dx, treatment, and any further evaluation and other management   Signed electronically by: Stefano Redgie Butts, MD  [facility] Endocrinology  [facility] [address] [place] [place], [place] [address] Phone: [phone] FAX: [phone]   CC: [name], Mardy HERO, PA-C [address] [place] [address] Phone: [phone]  Fax: [phone]  Return to Endocrinology clinic as below: Future Appointments  Date Time Provider Department Center  07/01/2023  8:50 AM Mareta Chesnut, Donell Redgie, MD [facility] None  07/15/2023  8:20 AM [place], PA-C [facility]  07/26/2023  8:30 AM [name], Mardy HERO, PA-C [facility] P[facility]  08/01/2023  8:00 AM Rana Lum CROME, NP [facility] H&V  12/02/2023  1:40 PM [facility] ANNUAL WELLNESS VISIT 1 [facility] P[facility]  12/24/2023 11:00 AM Sherryl Bouchard, NP [facility] None

## 2023-07-03 ENCOUNTER — Encounter: Payer: Self-pay | Admitting: Internal Medicine

## 2023-07-03 ENCOUNTER — Ambulatory Visit: Payer: Self-pay | Admitting: *Deleted

## 2023-07-03 NOTE — Telephone Encounter (Signed)
 His father can continue Imodium  one tab daily indefinitely, to hold if no BM in 24 hours. May increase to one tab po bid if needed. Not to take Imodium  day of colonoscopy prep or day of colonoscopy. THX.

## 2023-07-03 NOTE — Telephone Encounter (Signed)
 Refer to lab notes 06/06/2023 with prior recommendations as follows: Jan, pls conatc the patient's son [name] and verify if father's diarrhea is stable or improved on Imodium  one tab twice daily. If no improvement, per Dr. Leigh, ok to send in RX for Cholestyramine 4gm packet mix in 4 ounces of clear liquid of choice to be taken once daily, not to take within 4 hours of any other medication and to stop if no bowel movement in 24 hours. Also, Dr. Leigh requested to have the pharmacist review patient's medication list to verify if there are any contraindications to taking Cholestyramine with the meds on his list. Thank you for facilitating these instructions.  Pls make sure patient knows not to take any Imodium  or Cholestyramine the day before colonoscopy prep, the day of colonoscopy prep.

## 2023-07-12 ENCOUNTER — Telehealth: Payer: Self-pay | Admitting: Nurse Practitioner

## 2023-07-12 NOTE — Telephone Encounter (Signed)
 Spoke to patient's son, [name]. Patient is scheduled for office appointment with T.Garrett for 07/15/23 and colonoscopy at hospital on 07/25/23. He questions if patient still needs to keep 07/15/23 office appointment. It appears that this appointment old and was scheduled prior to the appointment at which time he saw Elida recently. Therefore, I have cancelled 07/15/23 appointment. Additionally, [name] asks for a copy of colonoscopy prep instructions. These have been made available in patient MyChart account.  [name] states patient is to have eye surgery next week and wants to make sure this will not preclude him from having colonoscopy procedure at the hospital. Advised that this would not be a problem on our end, but he should check with the physician completing the eye surgery as well to make sure there are no issues on their side of him having the colonoscopy procedure. [name] verbalizes understanding.

## 2023-07-12 NOTE — Telephone Encounter (Signed)
 Patient Son called and stated that he would like to nurse to return his call in order to go over his father Colonoscopy instructions. Best number to reach patient son is [phone]. Please advise.

## 2023-07-14 ENCOUNTER — Other Ambulatory Visit: Payer: Self-pay | Admitting: Internal Medicine

## 2023-07-14 DIAGNOSIS — E1042 Type 1 diabetes mellitus with diabetic polyneuropathy: Secondary | ICD-10-CM

## 2023-07-15 ENCOUNTER — Ambulatory Visit: Admitting: Physician Assistant

## 2023-07-16 ENCOUNTER — Telehealth: Payer: Self-pay | Admitting: Cardiovascular Disease

## 2023-07-16 NOTE — Telephone Encounter (Signed)
   Pre-operative Risk Assessment    Patient Name: [name] [name]  DOB: 08-11-1955 MRN: [id number]   Date of last office visit: 03/15/2023 Date of next office visit: 08/01/2023   Request for Surgical Clearance    Procedure:  vitrectomy with endolaser membrane peel right eye   Date of Surgery:  Clearance TBD                                Surgeon:  Dr. Arley Ruder  Surgeon's Group or Practice Name:  [facility]  Phone number:  [phone] Fax number:  [phone]   Type of Clearance Requested:   - Medical  - Pharmacy:  Hold Apixaban  (Eliquis ) 3 days prior    Type of Anesthesia:  Local    Additional requests/questions:    Bonney Bernarda JONETTA Melvenia   07/16/2023, 1:40 PM

## 2023-07-17 ENCOUNTER — Encounter: Payer: Self-pay | Admitting: Cardiovascular Disease

## 2023-07-17 NOTE — Telephone Encounter (Signed)
 Called son back made him aware we have clearance and are working on it he voiced understanding just wants us  to keep him updated

## 2023-07-17 NOTE — Telephone Encounter (Signed)
 Son is calling to see if pt is cleared for surgery. Please advise

## 2023-07-18 ENCOUNTER — Telehealth: Payer: Self-pay

## 2023-07-18 ENCOUNTER — Encounter (HOSPITAL_COMMUNITY): Payer: Self-pay | Admitting: Physician Assistant

## 2023-07-18 ENCOUNTER — Telehealth: Payer: Self-pay | Admitting: Gastroenterology

## 2023-07-18 ENCOUNTER — Encounter (HOSPITAL_COMMUNITY): Payer: Self-pay | Admitting: Gastroenterology

## 2023-07-18 NOTE — Telephone Encounter (Signed)
 Procedure:Colonoscopy/Endoscopy Procedure date: 07/25/23 Procedure location: [facility] Arrival Time: 8:30 am Spoke with the patient Y/N:   No, I left a detailed message on [phone] on 07/18/23 @ 11:59 am for the patient to return call, also called [phone] and phone rung until it started beeping. Yes, talked with [name], pt's son, 07/19/23 @ 10:52 am   Any prep concerns? No  Has the patient obtained the prep from the pharmacy ? Yes Do you have a care partner and transportation: Yes Any additional concerns? No

## 2023-07-18 NOTE — Telephone Encounter (Signed)
 Patient with diagnosis of afib on Eliquis  for anticoagulation.    Procedure: vitrectomy with endolaser membrane peel right eye  Date of procedure: TBD   CHA2DS2-VASc Score = 5   This indicates a 7.2% annual risk of stroke. The patient's score is based upon: CHF History: 1 HTN History: 1 Diabetes History: 1 Stroke History: 0 Vascular Disease History: 1 Age Score: 1 Gender Score: 0      CrCl 68 ml/min Platelet count 214  Patient has not had an Afib/aflutter ablation within the last 3 months or DCCV within the last 30 days  Per office protocol, patient can hold Eliquis  for 2 days prior to procedure.    **This guidance is not considered finalized until pre-operative APP has relayed final recommendations.**

## 2023-07-18 NOTE — Telephone Encounter (Signed)
 Per Elenor Lee, RN at [facility]:  SABRASABRAThis pt is scheduled for next Thurs 7/24. He did get a pharmacy clearance for his blood thinner on 6/16 but anesthesia has reviewed his chart and thinks he needs medical cardiac clearance as well. He saw his cards last in march and at that appt he was having SOB and they ordered an echo and CT and neither has been done. Could a medical clearance be sent?      [facility] Pre-operative Risk Assessment     Request for surgical clearance:     Endoscopy Procedure  What type of surgery is being performed?    EGD/COLONOSCOPY  When is this surgery scheduled?     07-25-23  What type of clearance is required ? CARDIAC CLEARANCE  Are there any medications that need to be held prior to surgery and how long? N/A  Practice name and name of physician performing surgery?   [facility]  What is your office phone and fax number?      Phone- [phone]  Fax- [phone]  ATN: Topher Buenaventura, CMA  Anesthesia type (None, local, MAC, general) ?       MAC   Please route your response to Davelyn Gwinn, CMA

## 2023-07-18 NOTE — Progress Notes (Signed)
 [name] [name]   PCP-[name] PA Cardiologist-O'Neal MD Pulmonologist-Hunsucker MD  EKG-03/15/23 Echo-02/02/23 Cath-n/a [place] ICD/PM- n/a GLP1-n/a Blood Thinner- Eliquis  2 day hold  HX: CHF,CAD,HTN,CABG,DM,CVA, Patient was seen by GI in jan was EGD/Colon was deferred to be 6 months post stroke (nov 2024). Patient does see cards last seen 03/15/23, at appt was having SOB- ordered an echo and CT and f/u in 3 months. Did the pre op call with son, he says his dad is a little stubborn, hasn't done the echo or ct. Does have a f/u  with cards 7/31, they did do a -pharm/blood thinner clearance(06/17/23 but wasn't a cardiac/medical clearance. Also sees pulm, lov 05/09/23 was seeing for pleural effusions. Noted in pulm report they didn't recommend another thoracentisis d/t not helping improve symptoms when tried before, upped his dose of diruetics. Per son, pt lives by himself, uses walker for most of the time.  Anesthesia Review- Yes- since sob last cards appt and pending studies, needs cardiac clearance. GI office notified thru epic chat 7/17.

## 2023-07-18 NOTE — Telephone Encounter (Signed)
   Name: Graeson Nouri  DOB: Aug 06, 1955  MRN: [id number]  Primary Cardiologist: Darryle ONEIDA Decent, MD  Chart reviewed as part of pre-operative protocol coverage. The patient has an upcoming visit scheduled with [name], DNP on 08/01/2023 at which time clearance can be addressed in case there are any issues that would impact surgical recommendations.  Vitrectomy is not scheduled until TBD as below. I added preop FYI to appointment note so that provider is aware to address at time of outpatient visit.  Per office protocol the cardiology provider should forward their finalized clearance decision and recommendations regarding antiplatelet therapy to the requesting party below.    Per office protocol, patient can hold Eliquis  for 2 days prior to procedure.   I will route this message as FYI to requesting party and remove this message from the preop box as separate preop APP input not needed at this time.   Please call with any questions.  Lum LITTIE Louis, NP  07/18/2023, 4:32 PM

## 2023-07-19 ENCOUNTER — Telehealth: Admitting: Physician Assistant

## 2023-07-19 DIAGNOSIS — H579 Unspecified disorder of eye and adnexa: Secondary | ICD-10-CM

## 2023-07-19 NOTE — Progress Notes (Signed)
 Virtual Visit Consent   [name] [name], you are scheduled for a virtual visit with a [facility] provider today. Just as with appointments in the office, your consent must be obtained to participate. Your consent will be active for this visit and any virtual visit you may have with one of our providers in the next 365 days. If you have a MyChart account, a copy of this consent can be sent to you electronically.  As this is a virtual visit, video technology does not allow for your provider to perform a traditional examination. This may limit your provider's ability to fully assess your condition. If your provider identifies any concerns that need to be evaluated in person or the need to arrange testing (such as labs, EKG, etc.), we will make arrangements to do so. Although advances in technology are sophisticated, we cannot ensure that it will always work on either your end or our end. If the connection with a video visit is poor, the visit may have to be switched to a telephone visit. With either a video or telephone visit, we are not always able to ensure that we have a secure connection.  By engaging in this virtual visit, you consent to the provision of healthcare and authorize for your insurance to be billed (if applicable) for the services provided during this visit. Depending on your insurance coverage, you may receive a charge related to this service.  I need to obtain your verbal consent now. Are you willing to proceed with your visit today? [name] [name] has provided verbal consent on 07/19/2023 for a virtual visit (video or telephone). [name] CHRISTELLA Dickinson, PA-C  Date: 07/19/2023 6:45 PM   Virtual Visit via Video Note   I, [name] [name], connected with  [name] [name]  ([id number], 02/20/1955) 68 on 07/19/23 at  6:15 PM EDT by a video-enabled telemedicine application and verified that I am speaking with the correct person using two identifiers. Son, [name] [name], assisted  his father with the visit  Location: Patient: Virtual Visit Location Patient: Home Provider: Virtual Visit Location Provider: Home Office   I discussed the limitations of evaluation and management by telemedicine and the availability of in person appointments. The patient expressed understanding and agreed to proceed.    History of Present Illness: [name] [name] is a 68 y.o. who identifies as a male who was assigned male at birth, and is being seen today for eye pressure.  HPI: Eye Problem  The right eye is affected. This is a new problem. The current episode started today. The problem occurs constantly. The problem has been unchanged. There was no injury mechanism. The pain is mild. There is No known exposure to pink eye. He Does not wear contacts. Associated symptoms include eye redness (mild per son). Pertinent negatives include no blurred vision, eye discharge, double vision, fever, foreign body sensation, nausea, photophobia or vomiting. 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 He has tried nothing for the symptoms. The treatment provided no relief.     Problems:  Patient Active Problem List   Diagnosis Date Noted   CHF (congestive heart failure) (HCC) 02/03/2023   Acute on chronic heart failure with preserved ejection fraction (HFpEF) (HCC) 02/02/2023   Hypertensive urgency 02/02/2023   CVA (cerebral vascular accident) (HCC) 11/30/2022  Heart valve vegetation 11/23/2022   Noncompliance 11/20/2022   Requires continuous supervision for activities of daily living (ADL) 11/20/2022   Hypertensive emergency 11/20/2022   Pre-ulcerative calluses 03/05/2022   Protein-calorie malnutrition, severe 01/06/2022   UTI (urinary tract infection) 01/05/2022   Acute urinary retention 01/04/2022   Chronic  diastolic CHF (congestive heart failure) (HCC) 01/04/2022   Atrial fibrillation with slow ventricular response (HCC) 01/03/2022   Visual hallucinations 01/03/2022   Hematuria 01/03/2022   Type 2 diabetes mellitus with proliferative retinopathy of both eyes, with long-term current use of insulin  (HCC) 08/18/2021   Type 2 diabetes mellitus with diabetic polyneuropathy, with long-term current use of insulin  (HCC) 08/18/2021   Type 2 diabetes mellitus (HCC) 08/18/2021   Postherpetic neuralgia 08/11/2021   Blood clotting disorder (HCC) 07/12/2021   Type 1 diabetes mellitus (HCC) 02/16/2021   Chronic diarrhea 02/16/2021   Diarrhea 02/15/2021   Sinus bradycardia 02/15/2021   Acute exacerbation of CHF (congestive heart failure) (HCC) 02/14/2021   Iron deficiency anemia secondary to inadequate dietary iron intake 07/14/2020   Diabetic ulcer of left foot associated with type 1 diabetes mellitus (HCC) 06/30/2020   Pressure injury of skin 05/24/2020   BPH with obstruction/lower urinary tract symptoms 03/16/2020   PAF (paroxysmal atrial fibrillation) (HCC) 03/16/2020   Type 1 diabetes mellitus with diabetic foot infection (HCC) 01/27/2020   Normocytic anemia 01/13/2020   Encounter for general adult medical examination with abnormal findings 01/13/2020   Hyperlipidemia LDL goal <70 01/13/2020   Benign prostatic hyperplasia without lower urinary tract symptoms 01/13/2020   PVD (peripheral vascular disease) (HCC) 01/13/2020   Coronary artery disease involving bypass graft of transplanted heart without angina pectoris 01/13/2020   Paroxysmal atrial fibrillation (HCC) 01/13/2020   Recurrent major depressive disorder, in partial remission (HCC) 01/13/2020   Major depression, recurrent (HCC) 12/09/2019   Tear of lateral meniscus of knee 10/05/2019   Aortic dissection (HCC) 02/05/2019   Coronary artery disease 02/05/2019   Hypertension 02/05/2019   Psoriasis 02/05/2019   Vitamin D  deficiency 01/07/2019    Uncontrolled type 2 diabetes mellitus with hyperglycemia (HCC) 01/07/2019   Microalbuminuria 01/07/2019   Depression 06/10/2018   Patellar tendonitis of right knee 04/24/2018   Peyronie's disease 06/09/2015   Erectile dysfunction 03/15/2015   S/P aortic aneurysm repair 05/19/2012   S/P CABG (coronary artery bypass graft) 05/19/2012   Non-proliferative diabetic retinopathy (HCC) 02/27/2011   Contracture of hand joint 11/01/2010    Allergies:  Allergies  Allergen Reactions   Wool Alcohol [Lanolin] Hives and Itching    Wool fabric   Medications:  Current Outpatient Medications:    acetaminophen  (TYLENOL ) 500 MG tablet, Take 500 mg by mouth as needed for mild pain or moderate pain., Disp: , Rfl:    amLODipine  (NORVASC ) 2.5 MG tablet, TAKE 1 TABLET BY MOUTH EVERY DAY, Disp: 90 tablet, Rfl: 0   atorvastatin  (LIPITOR) 40 MG tablet, TAKE 1 TABLET BY MOUTH EVERY DAY, Disp: 90 tablet, Rfl: 1   brimonidine (ALPHAGAN) 0.2 % ophthalmic solution, 1 drop 2 (two) times daily., Disp: , Rfl:    Cholecalciferol  25 MCG (1000 UT) capsule, Take 1,000 Units by mouth daily., Disp: , Rfl:    Continuous Glucose Sensor (DEXCOM G7 SENSOR) MISC, 1 Device by Does not apply route as directed., Disp: 9 each, Rfl: 3   Continuous Glucose Transmitter (DEXCOM G6 TRANSMITTER) MISC, USE AS INSTRUCTED TO CHECK BLOOD SUGAR., Disp: 1 each, Rfl: 1   ELIQUIS  5 MG TABS tablet, TAKE 1 TABLET  BY MOUTH TWICE A DAY, Disp: 180 tablet, Rfl: 1   furosemide  (LASIX ) 20 MG tablet, TAKE 1 TABLET BY MOUTH EVERY DAY, Disp: 90 tablet, Rfl: 1   glipiZIDE  (GLUCOTROL ) 10 MG tablet, Take 1 tablet (10 mg total) by mouth daily before breakfast., Disp: 90 tablet, Rfl: 3   loperamide  (IMODIUM ) 2 MG capsule, Take 1 capsule (2 mg total) by mouth 4 (four) times daily as needed for diarrhea or loose stools., Disp: 30 capsule, Rfl: 1   loratadine  (CLARITIN ) 10 MG tablet, TAKE 1 TABLET BY MOUTH EVERY DAY, Disp: 90 tablet, Rfl: 1   mirtazapine  (REMERON )  15 MG tablet, TAKE 1 TABLET BY MOUTH EVERYDAY AT BEDTIME, Disp: 90 tablet, Rfl: 1   Multiple Vitamins-Minerals (MULTIVITAMIN WITH MINERALS) tablet, Take 1 tablet by mouth daily., Disp: , Rfl:    Na Sulfate-K Sulfate-Mg Sulfate concentrate (SUPREP) 17.5-3.13-1.6 GM/177ML SOLN, SMARTSIG:1 Kit(s) By Mouth Once, Disp: , Rfl:    PARoxetine  (PAXIL ) 20 MG tablet, TAKE 1 TABLET ONCE A DAY FOR DEPRESSION, Disp: 90 tablet, Rfl: 1   QUEtiapine  (SEROQUEL ) 25 MG tablet, GIVE 1 TABLET BY MOUTH AT BEDTIME FOR DELIRIUM, Disp: 90 tablet, Rfl: 0   sitaGLIPtin  (JANUVIA ) 100 MG tablet, Take 1 tablet (100 mg total) by mouth daily., Disp: 90 tablet, Rfl: 3   spironolactone  (ALDACTONE ) 25 MG tablet, Take 1 tablet (25 mg total) by mouth daily., Disp: 90 tablet, Rfl: 3  Observations/Objective: Patient is well-developed, well-nourished in no acute distress.  Resting comfortably at home.  Head is normocephalic, atraumatic.  No labored breathing.  Speech is clear and coherent with logical content.  Patient is alert and oriented at baseline.    Assessment and Plan: 1. Eye pressure (Primary)  - Feeling eye pressure with a mild headache but no visual changes currently - Advised son to monitor closely and if headache worsens or develops vision changes he should be seen immediately in person at ER - If pressure remains but no visual changes develop he can be seen in person at a local Urgent Care for a formal eye exam - It is possible pressure is from sleeping sitting up with his head hanging down, but based off his history will monitor closely - Will No charge the visit as we are unable to assist and he will be monitoring at home and following up in person with strict return precautions  Follow Up Instructions: I discussed the assessment and treatment plan with the patient. The patient was provided an opportunity to ask questions and all were answered. The patient agreed with the plan and demonstrated an understanding of  the instructions.  A copy of instructions were sent to the patient via MyChart unless otherwise noted below.    The patient was advised to call back or seek an in-person evaluation if the symptoms worsen or if the condition fails to improve as anticipated.    [name] CHRISTELLA Dickinson, PA-C

## 2023-07-19 NOTE — Telephone Encounter (Signed)
 Heavenly, pls contact patient and send a message to his son and inform the patient we need to cancel his EGD/colonoscopy on 07/25/2023 at [facility] because the anesthesiologist reviewed his case and requested a cardiac clearance prior to the patient proceeding with these procedures.  Patient is scheduled to see his cardiologist on 08/01/2023.   Cancel his EGD/colonoscopy 07/25/2023.  To reschedule that was a long hospital with Dr. Leigh once official cardiac clearance received. Thank you.

## 2023-07-19 NOTE — Patient Instructions (Signed)
 [name] [name], thank you for joining [name] CHRISTELLA Dickinson, PA-C for today's virtual visit.  While this provider is not your primary care provider (PCP), if your PCP is located in our provider database this encounter information will be shared with them immediately following your visit.   A [facility] MyChart account gives you access to today's visit and all your visits, tests, and labs performed at [name]  click here if you don't have a [facility] MyChart account or go to [email or web address]  Consent: (Patient) [name] [name] provided verbal consent for this virtual visit at the beginning of the encounter.  Current Medications:  Current Outpatient Medications:    acetaminophen  (TYLENOL ) 500 MG tablet, Take 500 mg by mouth as needed for mild pain or moderate pain., Disp: , Rfl:    amLODipine  (NORVASC ) 2.5 MG tablet, TAKE 1 TABLET BY MOUTH EVERY DAY, Disp: 90 tablet, Rfl: 0   atorvastatin  (LIPITOR) 40 MG tablet, TAKE 1 TABLET BY MOUTH EVERY DAY, Disp: 90 tablet, Rfl: 1   brimonidine (ALPHAGAN) 0.2 % ophthalmic solution, 1 drop 2 (two) times daily., Disp: , Rfl:    Cholecalciferol  25 MCG (1000 UT) capsule, Take 1,000 Units by mouth daily., Disp: , Rfl:    Continuous Glucose Sensor (DEXCOM G7 SENSOR) MISC, 1 Device by Does not apply route as directed., Disp: 9 each, Rfl: 3   Continuous Glucose Transmitter (DEXCOM G6 TRANSMITTER) MISC, USE AS INSTRUCTED TO CHECK BLOOD SUGAR., Disp: 1 each, Rfl: 1   ELIQUIS  5 MG TABS tablet, TAKE 1 TABLET BY MOUTH TWICE A DAY, Disp: 180 tablet, Rfl: 1   furosemide  (LASIX ) 20 MG tablet, TAKE 1 TABLET BY MOUTH EVERY DAY, Disp: 90 tablet, Rfl: 1   glipiZIDE  (GLUCOTROL ) 10 MG tablet, Take 1 tablet (10 mg total) by mouth daily before breakfast., Disp: 90 tablet, Rfl: 3   loperamide  (IMODIUM ) 2 MG capsule, Take 1 capsule (2 mg total) by mouth 4 (four) times daily as needed for diarrhea or loose stools., Disp: 30 capsule, Rfl: 1    loratadine  (CLARITIN ) 10 MG tablet, TAKE 1 TABLET BY MOUTH EVERY DAY, Disp: 90 tablet, Rfl: 1   mirtazapine  (REMERON ) 15 MG tablet, TAKE 1 TABLET BY MOUTH EVERYDAY AT BEDTIME, Disp: 90 tablet, Rfl: 1   Multiple Vitamins-Minerals (MULTIVITAMIN WITH MINERALS) tablet, Take 1 tablet by mouth daily., Disp: , Rfl:    Na Sulfate-K Sulfate-Mg Sulfate concentrate (SUPREP) 17.5-3.13-1.6 GM/177ML SOLN, SMARTSIG:1 Kit(s) By Mouth Once, Disp: , Rfl:    PARoxetine  (PAXIL ) 20 MG tablet, TAKE 1 TABLET ONCE A DAY FOR DEPRESSION, Disp: 90 tablet, Rfl: 1   QUEtiapine  (SEROQUEL ) 25 MG tablet, GIVE 1 TABLET BY MOUTH AT BEDTIME FOR DELIRIUM, Disp: 90 tablet, Rfl: 0   sitaGLIPtin  (JANUVIA ) 100 MG tablet, Take 1 tablet (100 mg total) by mouth daily., Disp: 90 tablet, Rfl: 3   spironolactone  (ALDACTONE ) 25 MG tablet, Take 1 tablet (25 mg total) by mouth daily., Disp: 90 tablet, Rfl: 3   Medications ordered in this encounter:  No orders of the defined types were placed in this encounter.    *If you need refills on other medications prior to your next appointment, please contact your pharmacy*  Follow-Up: Call back or seek an in-person evaluation if the symptoms worsen or if the condition fails to improve as anticipated.  [facility] [phone]   If you have been instructed to have an in-person evaluation today at a local Urgent Care facility, please use the link  below. It will take you to a list of all of our available [facility], including address, phone number and hours of operation. Please do not delay care.  [facility]  If you or a family member do not have a primary care provider, use the link below to schedule a visit and establish care. When you choose a [facility] primary care physician or advanced practice provider, you gain a long-term partner in health. Find a Primary Care Provider  Learn more about [facility]'s in-office and virtual care options: [facility] - Get Care Now

## 2023-07-20 NOTE — Telephone Encounter (Signed)
 Alethea thanks for contacting the patient but no one cancelled him in the system and still listed as scheduled. Can someone please cancel his appointment with endo. I had been trying to add a patient in this date and did not realize a spot was there if this patient cancelled.  Dottie - not sure if you already scheduled the other patient for colonoscopy with Dr. Legrand on Tuesday. If so, fine. If not, can also offer her 7/24 with me if this slot is open. Thanks

## 2023-07-22 ENCOUNTER — Telehealth: Payer: Self-pay | Admitting: Cardiovascular Disease

## 2023-07-22 NOTE — Telephone Encounter (Signed)
 Endoscopy/Colonoscopy previously scheduled at [facility] on 07/25/23 with Dr Leigh has been cancelled but may be rescheduled pending cardiology appointment 08/01/23.

## 2023-07-22 NOTE — Telephone Encounter (Signed)
 Son Darrold) called to follow-up on patient's clearance.

## 2023-07-22 NOTE — Telephone Encounter (Signed)
 Agree, thanks. Let's book him for Sept and hopefully he can see cardiology within the next month

## 2023-07-22 NOTE — Telephone Encounter (Signed)
  All Conversations: [facility] Clearance (Newest Message First)            Claudene Naomie SAILOR, RN    07/22/23  8:52 AM Note Endoscopy/Colonoscopy previously scheduled at [facility] on 07/25/23 with Dr Leigh has been cancelled but may be rescheduled pending cardiology appointment 08/01/23.

## 2023-07-22 NOTE — Telephone Encounter (Signed)
 I s/w the pt's son [name] [name] and explained the pt has an appt in the office to be seen before he can be cleared. Pt's son states the pt will need to be cleared for 2 different procedures.   Procedure with Dr. Elner ([facility])  Procedure with Dr. [facility]  I assured pt's son I will make sure [name], NP is aware there will be 2 procedures for preop clearance.   Pt's son thanked me for the help.

## 2023-07-23 ENCOUNTER — Telehealth: Payer: Self-pay

## 2023-07-23 NOTE — Telephone Encounter (Signed)
 Patient has an appointment for preop clearance with [name], NP on 08/01/23. Of note, he also has request for vitrectomy.  Per office protocol, patient can hold Eliquis  for 2 days prior to procedure.   Rosaline EMERSON Bane, NP-C  07/23/2023, 1:26 PM [address] [place], [place] [address] Office [phone] Fax [phone]

## 2023-07-23 NOTE — Telephone Encounter (Addendum)
 Inbound call from patient's son [name], stating he is returning a call from a nurse. Unsure what the call is in regards to.

## 2023-07-23 NOTE — Telephone Encounter (Signed)
 [facility] Pre-operative Risk Assessment     Request for surgical clearance:     Endoscopy Procedure  What type of surgery is being performed?     Endoscopy and Colonoscopy   When is this surgery scheduled?     09/09/23  What type of clearance is required ?   Pharmacy  Are there any medications that need to be held prior to surgery and how long? Eliquis  2 days  Practice name and name of physician performing surgery?      [facility]  What is your office phone and fax number?      Phone- [phone]  Fax- [phone]  Anesthesia type (None, local, MAC, general) ?       MAC   Please route your response to Alethea Blocker, CMA

## 2023-07-25 ENCOUNTER — Ambulatory Visit (HOSPITAL_COMMUNITY): Admit: 2023-07-25 | Admitting: Gastroenterology

## 2023-07-25 SURGERY — COLONOSCOPY
Anesthesia: Monitor Anesthesia Care

## 2023-07-25 NOTE — Telephone Encounter (Signed)
 I spoke with the patients [name] and he stated that September 8th would not be a good day for him or the patient, considering they will be out of town. He states that Monday's work best and that any Monday after 09/23/23 will be fine. I will reschedule the procedure, call and inform him of the new date and email the instructions to [facility].

## 2023-07-26 ENCOUNTER — Ambulatory Visit: Admitting: Physician Assistant

## 2023-07-26 ENCOUNTER — Encounter: Payer: Self-pay | Admitting: Physician Assistant

## 2023-07-26 VITALS — BP 110/60 | HR 66 | Temp 97.7°F | Ht 71.0 in | Wt 151.2 lb

## 2023-07-26 DIAGNOSIS — K529 Noninfective gastroenteritis and colitis, unspecified: Secondary | ICD-10-CM

## 2023-07-26 DIAGNOSIS — D508 Other iron deficiency anemias: Secondary | ICD-10-CM

## 2023-07-26 DIAGNOSIS — R634 Abnormal weight loss: Secondary | ICD-10-CM

## 2023-07-26 DIAGNOSIS — R5383 Other fatigue: Secondary | ICD-10-CM | POA: Diagnosis not present

## 2023-07-26 DIAGNOSIS — L409 Psoriasis, unspecified: Secondary | ICD-10-CM

## 2023-07-26 DIAGNOSIS — R748 Abnormal levels of other serum enzymes: Secondary | ICD-10-CM

## 2023-07-26 DIAGNOSIS — R630 Anorexia: Secondary | ICD-10-CM

## 2023-07-26 DIAGNOSIS — E1165 Type 2 diabetes mellitus with hyperglycemia: Secondary | ICD-10-CM | POA: Diagnosis not present

## 2023-07-26 LAB — COMPREHENSIVE METABOLIC PANEL WITH GFR
ALT: 11 U/L (ref 0–53)
AST: 15 U/L (ref 0–37)
Albumin: 3.2 g/dL — ABNORMAL LOW (ref 3.5–5.2)
Alkaline Phosphatase: 130 U/L — ABNORMAL HIGH (ref 39–117)
BUN: 31 mg/dL — ABNORMAL HIGH (ref 6–23)
CO2: 29 meq/L (ref 19–32)
Calcium: 8.5 mg/dL (ref 8.4–10.5)
Chloride: 99 meq/L (ref 96–112)
Creatinine, Ser: 1.06 mg/dL (ref 0.40–1.50)
GFR: 71.12 mL/min
Glucose, Bld: 223 mg/dL — ABNORMAL HIGH (ref 70–99)
Potassium: 5.2 meq/L — ABNORMAL HIGH (ref 3.5–5.1)
Sodium: 135 meq/L (ref 135–145)
Total Bilirubin: 0.5 mg/dL (ref 0.2–1.2)
Total Protein: 6.4 g/dL (ref 6.0–8.3)

## 2023-07-26 LAB — CBC WITH DIFFERENTIAL/PLATELET
Basophils Absolute: 0 K/uL (ref 0.0–0.1)
Basophils Relative: 0.8 % (ref 0.0–3.0)
Eosinophils Absolute: 0.1 K/uL (ref 0.0–0.7)
Eosinophils Relative: 2.2 % (ref 0.0–5.0)
HCT: 30.5 % — ABNORMAL LOW (ref 39.0–52.0)
Hemoglobin: 9.8 g/dL — ABNORMAL LOW (ref 13.0–17.0)
Lymphocytes Relative: 15.8 % (ref 12.0–46.0)
Lymphs Abs: 0.9 K/uL (ref 0.7–4.0)
MCHC: 32.2 g/dL (ref 30.0–36.0)
MCV: 82.3 fl (ref 78.0–100.0)
Monocytes Absolute: 0.4 K/uL (ref 0.1–1.0)
Monocytes Relative: 7.7 % (ref 3.0–12.0)
Neutro Abs: 4.2 K/uL (ref 1.4–7.7)
Neutrophils Relative %: 73.5 % (ref 43.0–77.0)
Platelets: 227 K/uL (ref 150.0–400.0)
RBC: 3.7 Mil/uL — ABNORMAL LOW (ref 4.22–5.81)
RDW: 20.2 % — ABNORMAL HIGH (ref 11.5–15.5)
WBC: 5.7 K/uL (ref 4.0–10.5)

## 2023-07-26 LAB — HEMOGLOBIN A1C: Hgb A1c MFr Bld: 9.1 % — ABNORMAL HIGH (ref 4.6–6.5)

## 2023-07-26 LAB — C-REACTIVE PROTEIN: CRP: 2.2 mg/dL (ref 0.5–20.0)

## 2023-07-26 LAB — IBC + FERRITIN
Ferritin: 76.9 ng/mL (ref 22.0–322.0)
Iron: 34 ug/dL — ABNORMAL LOW (ref 42–165)
Saturation Ratios: 12.3 % — ABNORMAL LOW (ref 20.0–50.0)
TIBC: 277.2 ug/dL (ref 250.0–450.0)
Transferrin: 198 mg/dL — ABNORMAL LOW (ref 212.0–360.0)

## 2023-07-26 LAB — SEDIMENTATION RATE: Sed Rate: 71 mm/h — ABNORMAL HIGH (ref 0–20)

## 2023-07-26 NOTE — Progress Notes (Signed)
 Patient ID: [name] [name], male    DOB: 09-07-1955, 68 y.o.   MRN: [id number]   Assessment & Plan:  Uncontrolled type 2 diabetes mellitus with hyperglycemia (HCC) -     Comprehensive metabolic panel with GFR -     Hemoglobin A1c -     Microalbumin / creatinine urine ratio -     Gvoke HypoPen  2-Pack; Inject pen into upper thigh and hold it there for five seconds in the event of severe hypoglycemia.  Dispense: 0.4 mL; Refill: 2  Scalp psoriasis -     Ketoconazole ; Apply 1 Application topically 2 (two) times a week.  Dispense: 120 mL; Refill: 0 -     Clobetasol  Propionate; Apply 1 Application topically 2 (two) times daily. For scalp psoriasis.  Dispense: 50 mL; Refill: 0  Iron deficiency anemia secondary to inadequate dietary iron intake -     CBC with Differential/Platelet -     IBC + Ferritin  Loss of appetite -     C-reactive protein -     Sedimentation rate  Unintentional weight loss -     C-reactive protein -     Sedimentation rate  Other fatigue -     C-reactive protein -     Sedimentation rate  Alkaline phosphatase elevation  Chronic diarrhea      Assessment & Plan Decreased Appetite and Weight Loss He has experienced a significant decrease in appetite over the past two months, resulting in a weight loss of approximately 17 pounds since March. Despite being on mirtazapine  and quetiapine , which should increase appetite, his appetite remains low. Depression or medication interactions affecting appetite are considered. - Consider reducing quetiapine  if evening confusion persists. - Consider increasing Paxil  to address potential depression-related appetite loss. - Provide dietary guidance focusing on protein intake and diabetic-friendly snacks.  Hypoglycemia He experienced a significant hypoglycemic event in June, with nocturnal hypoglycemia. Decreased appetite over the past two months could lead to more frequent hypoglycemic episodes, especially given his  diabetic medications. A glucagon  pen is available for emergency use during severe hypoglycemic episodes when he cannot access food or drink. - Attempt to get approval for a glucagon  pen for emergency use. - Monitor blood glucose levels closely, especially during periods of decreased appetite.  Elevated Alkaline Phosphatase (ALK Phos) His ALK Phos levels are elevated, suggesting a bone-related issue rather than a hepatic problem, as GGT levels are low. Potential bone malignancy is a concern, which could explain weight loss and appetite issues.  - Order a PET scan or bone scan to investigate potential bone malignancy. - Repeat liver function tests to monitor ALK Phos levels.  Chronic Diarrhea He has chronic diarrhea managed with Imodium  twice daily. A colonoscopy is planned to investigate the cause. - Continue Imodium  twice daily. - Proceed with the scheduled colonoscopy.  Nocturia He experiences frequent nocturia, disrupting sleep. Benign prostatic hyperplasia (BPH) could also be a factor. Reducing furosemide  to every other day is suggested to assess its impact. Flomax  is considered for BPH, but there is concern about orthostatic hypotension and potential falls. - Consider reducing furosemide  to every other day. - Consider Flomax  for BPH if nocturia persists, with caution for orthostatic hypotension.  Plaque Psoriasis He has severe plaque psoriasis on the scalp, with occasional pruritus. There is also pruritus on the back of the neck, possibly related to psoriasis. Medicated shampoos are prescribed, with a dermatology referral considered if symptoms persist. - Prescribe medicated shampoos for scalp psoriasis. - Consider dermatology  referral if symptoms persist.  Follow-up He has multiple upcoming appointments and tests, including cardiology and a colonoscopy. Monitoring various health issues and adjusting treatments as necessary is needed. Blood work and a urine sample are planned for further  assessment. - Attend cardiology appointment next week. - Follow up in a couple of months to reassess conditions and treatment efficacy. - Complete lab work including blood draw and urine sample.      Return in about 3 months (around 10/26/2023) for recheck/follow-up.    Subjective:    Chief Complaint  Patient presents with   Medical Management of Chronic Issues    Diabetes, Hypertension, CHF   Anorexia    Pt's son concerned about decrease appetite for the past few months .    HPI Discussed the use of AI scribe software for clinical note transcription with the patient, who gave verbal consent to proceed.  History of Present Illness [name] [name] is a 68 year old male with diabetes who presents with concerns about appetite loss and weight loss. Here with son, who helps with most of history.  He has experienced a significant decrease in appetite over the past two months, resulting in a weight loss of approximately 17 pounds, from 168 pounds in March to 151 pounds currently. Despite being on medications like mirtazapine  and Seroquel , he continues to experience appetite loss. He notes that his appetite has 'cratered', which is unusual for him as he usually has a good appetite.  He has a history of diabetes and experienced a low blood sugar incident in June, where his blood sugar dropped significantly during the night. He managed this by eating after being alerted by his phone. He is concerned about managing his blood sugar levels, especially with his decreased appetite and the potential for low blood sugar events. He is currently on several medications including mirtazapine , Seroquel , and Paxil . He takes mirtazapine  and Seroquel  in the evening and Paxil  in the morning. He has noticed that he becomes 'a little spacey and less sharp' in the evenings, which he attributes to his evening medications.  He experiences frequent urination at night, which disrupts his sleep. He takes Lasix   in the morning, but still gets up multiple times at night to urinate. He has a history of prostate hyperplasia.  He has a history of elevated alkaline phosphatase levels, which have been noted in his liver function tests. He is unaware of any specific liver issues.  He manages diarrhea with Imodium , taking it twice daily. Attempts to reduce the dosage to once daily resulted in a recurrence of diarrhea. No stomach aches are reported, and he feels that the diarrhea is under control with the current regimen.  He has severe plaque psoriasis on his scalp, which sometimes itches. He also experiences constant itching on the back of his neck.     Past Medical History:  Diagnosis Date   Anxiety    Arthritis    Cataract    CHF (congestive heart failure) (HCC)    Constipation    pt states goes EOD- hard stools    Coronary artery disease    Depression    Diabetes mellitus without complication (HCC)    Diabetic retinopathy (HCC)    DKA (diabetic ketoacidoses)    Hyperlipidemia    Hypertension    off meds   Hypertensive retinopathy    Lesion of ulnar nerve, left upper limb 07/12/2017   Non-intractable vomiting without nausea 06/10/2018   Postherpetic neuralgia 08/11/2021   Speculative diagnosis  Psoriasis    Tubular adenoma of colon 03/2019    Past Surgical History:  Procedure Laterality Date   ABDOMINAL AORTOGRAM W/LOWER EXTREMITY N/A 08/30/2020   Procedure: ABDOMINAL AORTOGRAM W/LOWER EXTREMITY;  Surgeon: Serene Gaile ORN, MD;  Location: [facility];  Service: Cardiovascular;  Laterality: N/A;   ABDOMINAL AORTOGRAM W/LOWER EXTREMITY N/A 11/15/2020   Procedure: ABDOMINAL AORTOGRAM W/LOWER EXTREMITY;  Surgeon: Serene Gaile ORN, MD;  Location: [facility];  Service: Cardiovascular;  Laterality: N/A;   ANKLE SURGERY     right   CARDIAC CATHETERIZATION  09/25/2001   COLONOSCOPY     ~10 yr ago- normal  per pt    CORONARY ARTERY BYPASS GRAFT  09/30/2001   CABG x 4   IR  THORACENTESIS ASP PLEURAL SPACE W/IMG GUIDE  05/19/2020   IR THORACENTESIS ASP PLEURAL SPACE W/IMG GUIDE  02/04/2023   LAPAROSCOPIC INGUINAL HERNIA REPAIR Bilateral 02/09/2010   MASS EXCISION Left 03/12/2013   Procedure: LEFT INDEX EXCISION MASS AND DEBRIDMENT DISTAL INTERPHALANGEAL JOINT;  Surgeon: Franky JONELLE Curia, MD;  Location: [facility];  Service: Orthopedics;  Laterality: Left;   PERIPHERAL VASCULAR BALLOON ANGIOPLASTY Left 08/30/2020   Procedure: PERIPHERAL VASCULAR BALLOON ANGIOPLASTY;  Surgeon: Serene Gaile ORN, MD;  Location: [facility];  Service: Cardiovascular;  Laterality: Left;  Peroneal   TRANSESOPHAGEAL ECHOCARDIOGRAM (CATH LAB) N/A 11/26/2022   Procedure: TRANSESOPHAGEAL ECHOCARDIOGRAM;  Surgeon: Kate Lonni CROME, MD;  Location: [facility] INVASIVE CV LAB;  Service: Cardiovascular;  Laterality: N/A;    Family History  Problem Relation Age of Onset   Lymphoma Father    Cancer Paternal Grandmother        Colon Cancer   Colon cancer Paternal Grandmother    Colon polyps Neg Hx    Esophageal cancer Neg Hx    Rectal cancer Neg Hx    Stomach cancer Neg Hx    Diabetes Mellitus I Neg Hx    Pancreatic cancer Neg Hx     Social History   Tobacco Use   Smoking status: Some Days    Types: Cigars    Last attempt to quit: 2022    Years since quitting: 3.5   Smokeless tobacco: Never   Tobacco comments:    1-2 per week cigars  05/09/2023  Vaping Use   Vaping status: Never Used  Substance Use Topics   Alcohol use: Yes    Comment: about once a year   Drug use: No     Allergies  Allergen Reactions   Wool Alcohol [Lanolin] Hives and Itching    Wool fabric    Review of Systems NEGATIVE UNLESS OTHERWISE INDICATED IN HPI      Objective:     BP 110/60 (BP Location: Left Arm, Patient Position: Sitting, Cuff Size: Normal)   Pulse 66   Temp 97.7 F (36.5 C) (Temporal)   Ht 5' 11 (1.803 m)   Wt 151 lb 4 oz (68.6 kg)   SpO2 94%   BMI 21.10 kg/m   Wt  Readings from Last 3 Encounters:  07/26/23 151 lb 4 oz (68.6 kg)  06/06/23 152 lb (68.9 kg)  05/09/23 156 lb 6.4 oz (70.9 kg)    BP Readings from Last 3 Encounters:  07/26/23 110/60  06/06/23 (!) 150/70  05/09/23 (!) 156/77     Physical Exam Vitals and nursing note reviewed.  Constitutional:      General: He is not in acute distress.    Appearance: Normal appearance. He  is not ill-appearing.     Comments: Using rollator walker, unsteady in general    HENT:     Right Ear: Tympanic membrane normal.     Left Ear: Tympanic membrane normal.     Mouth/Throat:     Mouth: Mucous membranes are dry.  Eyes:     Extraocular Movements: Extraocular movements intact.     Conjunctiva/sclera: Conjunctivae normal.     Pupils: Pupils are equal, round, and reactive to light.  Cardiovascular:     Rate and Rhythm: Normal rate and regular rhythm.     Heart sounds: No murmur heard. Pulmonary:     Effort: Pulmonary effort is normal.     Breath sounds: Normal breath sounds. No wheezing.  Musculoskeletal:     Right lower leg: No edema.     Left lower leg: No edema.  Skin:    General: Skin is dry.     Findings: No lesion.     Comments: Scalp psoriasis noted   Neurological:     Mental Status: He is alert and oriented to person, place, and time.     Cranial Nerves: No cranial nerve deficit.     Motor: Weakness (generalized, unsteady) present.  Psychiatric:        Mood and Affect: Mood is not depressed. Affect is flat.     Comments: baseline        Time Spent: 45 minutes of total time was spent on the date of the encounter performing the following actions: chart review prior to seeing the patient, obtaining history, performing a medically necessary exam, counseling on the treatment plan, placing orders, and documenting in our EHR.     Reuel Lamadrid M Eldred Lievanos, PA-C

## 2023-07-27 ENCOUNTER — Other Ambulatory Visit: Payer: Self-pay | Admitting: Physician Assistant

## 2023-07-27 ENCOUNTER — Ambulatory Visit: Payer: Self-pay | Admitting: Physician Assistant

## 2023-07-27 DIAGNOSIS — D509 Iron deficiency anemia, unspecified: Secondary | ICD-10-CM

## 2023-07-27 DIAGNOSIS — R700 Elevated erythrocyte sedimentation rate: Secondary | ICD-10-CM

## 2023-07-27 DIAGNOSIS — R634 Abnormal weight loss: Secondary | ICD-10-CM

## 2023-07-27 DIAGNOSIS — R748 Abnormal levels of other serum enzymes: Secondary | ICD-10-CM

## 2023-07-27 DIAGNOSIS — R5383 Other fatigue: Secondary | ICD-10-CM

## 2023-07-29 ENCOUNTER — Other Ambulatory Visit: Payer: Self-pay | Admitting: Physician Assistant

## 2023-07-29 DIAGNOSIS — E875 Hyperkalemia: Secondary | ICD-10-CM

## 2023-07-29 DIAGNOSIS — R700 Elevated erythrocyte sedimentation rate: Secondary | ICD-10-CM

## 2023-07-29 DIAGNOSIS — R748 Abnormal levels of other serum enzymes: Secondary | ICD-10-CM

## 2023-07-29 DIAGNOSIS — R634 Abnormal weight loss: Secondary | ICD-10-CM

## 2023-07-29 MED ORDER — KETOCONAZOLE 2 % EX SHAM: 1.0000 | MEDICATED_SHAMPOO | CUTANEOUS | 0 refills | Status: DC

## 2023-07-29 MED ORDER — CLOBETASOL PROPIONATE 0.05 % EX SOLN: 1.0000 | Freq: Two times a day (BID) | CUTANEOUS | 0 refills | Status: DC

## 2023-07-29 MED ORDER — GVOKE HYPOPEN 2-PACK 1 MG/0.2ML ~~LOC~~ SOAJ: SUBCUTANEOUS | 2 refills | Status: DC

## 2023-07-29 NOTE — Telephone Encounter (Signed)
 Please see pt son's response to last message as FYI

## 2023-07-30 ENCOUNTER — Encounter (HOSPITAL_COMMUNITY)
Admission: RE | Admit: 2023-07-30 | Discharge: 2023-07-30 | Disposition: A | Discharge status: Home / Self Care | Source: Ambulatory Visit | Attending: Physician Assistant | Admitting: Physician Assistant

## 2023-07-30 ENCOUNTER — Encounter: Payer: Self-pay | Admitting: Physician Assistant

## 2023-07-30 DIAGNOSIS — D509 Iron deficiency anemia, unspecified: Secondary | ICD-10-CM | POA: Insufficient documentation

## 2023-07-30 DIAGNOSIS — R748 Abnormal levels of other serum enzymes: Secondary | ICD-10-CM | POA: Insufficient documentation

## 2023-07-30 DIAGNOSIS — R634 Abnormal weight loss: Secondary | ICD-10-CM | POA: Diagnosis present

## 2023-07-30 DIAGNOSIS — R5383 Other fatigue: Secondary | ICD-10-CM | POA: Insufficient documentation

## 2023-07-30 DIAGNOSIS — R700 Elevated erythrocyte sedimentation rate: Secondary | ICD-10-CM | POA: Diagnosis present

## 2023-07-30 MED ORDER — TECHNETIUM TC 99M MEDRONATE IV KIT
20.0000 | PACK | Freq: Once | INTRAVENOUS | Status: AC | PRN
  Administered 2023-07-30: 20.8 via INTRAVENOUS

## 2023-08-01 ENCOUNTER — Ambulatory Visit: Attending: Emergency Medicine | Admitting: Emergency Medicine

## 2023-08-01 ENCOUNTER — Encounter: Payer: Self-pay | Admitting: Emergency Medicine

## 2023-08-01 VITALS — BP 138/60 | HR 60 | Ht 74.0 in | Wt 152.0 lb

## 2023-08-01 DIAGNOSIS — I4819 Other persistent atrial fibrillation: Secondary | ICD-10-CM

## 2023-08-01 DIAGNOSIS — I739 Peripheral vascular disease, unspecified: Secondary | ICD-10-CM | POA: Diagnosis not present

## 2023-08-01 DIAGNOSIS — I10 Essential (primary) hypertension: Secondary | ICD-10-CM

## 2023-08-01 DIAGNOSIS — E782 Mixed hyperlipidemia: Secondary | ICD-10-CM

## 2023-08-01 DIAGNOSIS — I25810 Atherosclerosis of coronary artery bypass graft(s) without angina pectoris: Secondary | ICD-10-CM

## 2023-08-01 DIAGNOSIS — I5032 Chronic diastolic (congestive) heart failure: Secondary | ICD-10-CM

## 2023-08-01 DIAGNOSIS — J90 Pleural effusion, not elsewhere classified: Secondary | ICD-10-CM | POA: Diagnosis not present

## 2023-08-01 DIAGNOSIS — Z01810 Encounter for preprocedural cardiovascular examination: Secondary | ICD-10-CM

## 2023-08-01 NOTE — Progress Notes (Signed)
 Cardiology Office Note:    Date:  08/01/2023  ID:  [name] [name], DOB [birth year], MRN [id number] PCP: [name] HERO, PA-C  [facility] Providers Cardiologist:  Darryle ONEIDA Decent, MD       Patient Profile:       Chief Complaint: 66-month follow-up and preoperative clearance History of Present Illness:  [name] [name] is a 69 y.o. male with visit-pertinent history of peripheral arterial disease, coronary artery disease, diabetes, HFpEF, hypertension, pleural effusion, PAF, T1DM  He has a history of CAD and aortic dissection s/p CABG x 3 + aortic aneurysm repair in 2003.  He was admitted 02/2021 for dyspnea found to be in acute CHF and atrial fibrillation.  Chest x-ray showed pleural effusions.  Notes state he reverted back to NSR.  He was diuresed with IV Lasix  and not placed on AV nodal blocking agents due to bradycardia with rates as low as the 40s.  Echocardiogram showed LVEF 55 to 60%, no RWMA, mild LVH, grade 1 DD, RV mildly reduced.   He was admitted 01/2023 for acute on chronic HFpEF.  Limited TTE with LVEF 60-65%.  He had presented with dyspnea and orthopnea and lower extremity swelling as well as weight gain.  BNP elevated at 457.  Chest x-ray showed moderate bilateral pleural effusions right greater than left.  Start IV Lasix .  Net 4.7L.  He underwent left thoracentesis with removal of 1.1 mL on 2/2 and right thoracentesis with removal of 1.8L on 2/3.  Spironolactone  and losartan  were discontinued due to AKI.  Was found to have subacute CVA with no focal neurosymptoms or deficits on exam.  MRI brain showed small foci of early subacute ischemia within the right frontal white matter and chronic microhemorrhage in the left cerebellum and both temporal lobes.  CT angio without LVO major stenosis.  Neurology signed off and recommended continuing Eliquis .  He was last seen in clinic on 03/15/2023.  Patient was doing well at that time as he was euvolemic and well compensated  no medication changes were made.  Repeat echocardiogram was ordered however not completed.  CT chest without contrast completed on 04/12/2023 showing moderate size right-greater than left layering pleural effusions, similar in size on recent chest x-rays and CT abdomen pelvis from 11/2022, adjacent compressive collapse or consolidation in the basal segments of both lower lobes, and partial collapse of lower lobe superior segments.  He was then referred to pulmonology for recurrent pleural effusions.  He was seen by pulmonology on 05/09/2023.  Pulmonologist suggested bilateral pleural effusions result of pulmonary venous hypertension related to cardiogenic volume overload likely exacerbated at times of hypoalbuminemia.  He is pending vitrectomy with endolaser membrane peel right eye with retina diabetic Center on date TBD  He is pending colonoscopy and endoscopy with [facility] on date TBD   Discussed the use of AI scribe software for clinical note transcription with the patient, who gave verbal consent to proceed.  History of Present Illness [name] [name] is a 68 year old male who presents for 44-month follow-up and preoperative clearance.  Today he presents to office with his son.  He tells me he is doing well overall.  He has no acute concerns today.  Denies any chest pains or exertional symptoms today.  Reports his shortness of breath has improved.  He denies any orthopnea or PND.  He is without any leg swelling today.  Denies any syncope, presyncope or lightheadedness/dizziness.  No palpitations or episodes of tachycardia.  He experiences lack of appetite, weight loss of 20 pounds, and fatigue. A whole body PET scan was performed to investigate these symptoms.  He remains active, using a rollator for walking and performing household chores.  Review of systems:  Please see the history of present illness. All other systems are reviewed and otherwise negative.      Studies  Reviewed:    EKG Interpretation Date/Time:  Thursday August 01 2023 08:09:46 EDT Ventricular Rate:  60 PR Interval:    QRS Duration:  90 QT Interval:  422 QTC Calculation: 422 R Axis:   30  Text Interpretation: Atrial fibrillation Possible Anterior infarct , age undetermined When compared with ECG of 15-Mar-2023 13:48, Left posterior fascicular block is no longer Present Nonspecific T wave abnormality no longer evident in Inferior leads Nonspecific T wave abnormality no longer evident in Anterolateral leads QT has shortened Confirmed by Rana Dixon [phone]) on 08/01/2023 10:42:57 PM    Echocardiogram 02/02/2023 1. Left ventricular ejection fraction, by estimation, is 60 to 65%. Left  ventricular ejection fraction by PLAX is 63 %. The left ventricle has  normal function. The left ventricle has no regional wall motion  abnormalities. There is mild asymmetric left  ventricular hypertrophy of the basal-septal segment.   2. Right ventricular systolic function is normal. The right ventricular  size is normal. There is moderately elevated pulmonary artery systolic  pressure. The estimated right ventricular systolic pressure is 45.9 mmHg.   3. The mitral valve is abnormal. Mild to moderate mitral valve  regurgitation. Moderate mitral annular calcification.   4. The aortic valve is tricuspid. Aortic valve regurgitation is not  visualized. Aortic valve sclerosis/calcification is present, without any  evidence of aortic stenosis.   5. Aortic dilatation noted. There is borderline dilatation of the  ascending aorta, measuring 38 mm.   6. The inferior vena cava is dilated in size with <50% respiratory  variability, suggesting right atrial pressure of 15 mmHg.   TEE 11/26/2022  1. Left ventricular ejection fraction, by estimation, is 60 to 65%. The  left ventricle has normal function.   2. Right ventricular systolic function is mildly reduced. The right  ventricular size is normal.   3. Left  atrial size was mildly dilated. No left atrial/left atrial  appendage thrombus was detected.   4. The mitral valve is normal in structure. Trivial mitral valve  regurgitation.   5. Aortic dilatation noted. There is mild dilatation of the aortic root,  measuring 40 mm.   6. The aortic valve is tricuspid. There is moderate thickening of the  aortic valve. Aortic valve regurgitation is not visualized. Aortic valve  sclerosis/calcification is present, without any evidence of aortic  stenosis. Thickened aortic valve leaflets,  but no vegetation seen   Risk Assessment/Calculations:    CHA2DS2-VASc Score = 5   This indicates a 7.2% annual risk of stroke. The patient's score is based upon: CHF History: 1 HTN History: 1 Diabetes History: 1 Stroke History: 0 Vascular Disease History: 1 Age Score: 1 Gender Score: 0             Physical Exam:   VS:  BP 138/60 (BP Location: Right Arm, Patient Position: Sitting, Cuff Size: Normal)   Pulse 60   Ht 6' 2 (1.88 m)   Wt 152 lb (68.9 kg)   BMI 19.52 kg/m    Wt Readings from Last 3 Encounters:  08/01/23 152 lb (68.9 kg)  07/26/23 151 lb 4 oz (68.6 kg)  06/06/23  152 lb (68.9 kg)    GEN: Well nourished, well developed in no acute distress NECK: No JVD; No carotid bruits CARDIAC: Irregular irregular rhythm.  No murmurs, rubs, gallops RESPIRATORY:  Clear to auscultation without rales, wheezing or rhonchi  ABDOMEN: Soft, non-tender, non-distended EXTREMITIES:  No edema; No acute deformity      Assessment and Plan:  Recurrent pleural effusions Initial pleural effusion 2022 and then reappear on 11/2022.  Persistently 2 thoracentesis on 02/2023 CT chest 04/2023 showed moderate size right greater than left layering pleural effusions Echocardiogram 02/2023 with LVEF 60 to 65%, no RWMA - Followed up with pulmonology on 5/8 and per note: All data suggest bilateral pleural effusions are result of pulmonary venous hypertension related to  cardiogenic volume overload. Likely exacerbated at times of hypoalbuminemia as demonstrated on labs. He denies any therapeutic benefit to thoracentesis. I see no reason to repeat thoracentesis unless symptoms change. - Today he appears euvolemic without volume overload on exam.  Denies any recent dyspnea - Pending repeat echocardiogram scheduled 9/18 - No changes to current therapy.  Continue furosemide  20 mg daily and spironolactone  25 mg daily  Diastolic heart failure Echocardiogram 02/2023 showed LVEF 60 to 65%, no RWMA, mild LVH - Today he appears euvolemic and well compensated on exam.  No fluid overload today.  He is without any dyspnea, orthopnea, PND, leg swelling - His history of hypoalbuminemia likely contributes to his fluid status - No changes to current therapy.  Continue Lasix  20 mg daily and spironolactone  25 mg daily - Pending echocardiogram on 9/18 to reevaluate LV function - Begin Ensure drinks daily due to malnutrition, weight loss and decreased appetite  Hypertension, goal <130/80 Blood pressure today is 138/60 and slightly above his goal - No changes to current therapy.  Continue to monitor BP at home alert office for consistent BP > 140/90 - Continue amlodipine  2.5 mg daily and spironolactone  25 mg daily  Coronary artery disease S/p CABG x 3 in 2003 - Today patient is without anginal symptoms.  He denies any chest pain.  Walks around with his rollator without exertional symptoms.  No indication for further ischemic evaluation at this time - Not on ASA due to [facility] due to bleeding risk - Continue atorvastatin  40 mg daily and Eliquis   Persistent atrial fibrillation History of persistent A-fib with slow ventricular response Today he is rate controlled with pulse of 60 bpm - No need for AV nodal blocking agents.  No lightheadedness, dizziness, syncope - Continue Eliquis  5 mg twice daily  Peripheral arterial disease R PT stenosis -> balloon 11/2020 L peroneal artery ->  balloon/recurrent stenosis 11/2020 Arterial US  03/2023 showed 50-84% stenosis noted in the right posterior tibial artery and left without stenosis.  ABIs 03/2023 within the normal range - Today he denies any claudication - Continue atorvastatin  40 mg daily  Hyperlipidemia, LDL goal <55 LDL 52 on 11/2022 and well-controlled - Continue atorvastatin  40 mg daily  Elevated alkaline phosphatase Currently being worked up by PCP with full body PET scan to investigate potential bone malignancy.  Could correlate with his decreased appetite and weight loss - PET scan currently pending  T2DM A1c 9.1% - Management per PCP   Preoperative cardiovascular exam According to the Revised Cardiac Risk Index (RCRI), his Perioperative Risk of Major Cardiac Event is (%): 11. His Functional Capacity in METs is: 4.4 according to the Duke Activity Status Index (DASI). Therefore, based on ACC/AHA guidelines, patient would be at acceptable risk for the planned procedure  without further cardiovascular testing. I will route this recommendation to the requesting party via Epic fax function.  Given his current medical history he is high risk for procedures  He is pending vitrectomy with endolaser membrane peel right eye with retina diabetic Center on date TBD  He is pending colonoscopy and endoscopy with [facility] [facility] on date TBD  Per office protocol, patient can hold Eliquis  for 2 days prior to procedure.   Patient will not need bridging with Lovenox (enoxaparin) around procedure.     Dispo:  Return in about 3 months (around 11/01/2023).  Signed, Lum LITTIE Louis, NP

## 2023-08-01 NOTE — Patient Instructions (Addendum)
 Medication Instructions:  NO CHANGES   Lab Work: BMET   Testing/Procedures:  TO BE SCHEDULED  Your physician has requested that you have an echocardiogram. Echocardiography is a painless test that uses sound waves to create images of your heart. It provides your doctor with information about the size and shape of your heart and how well your heart's chambers and valves are working. This procedure takes approximately one hour. There are no restrictions for this procedure. Please do NOT wear cologne, perfume, aftershave, or lotions (deodorant is allowed). Please arrive 15 minutes prior to your appointment time.  Please note: We ask at that you not bring children with you during ultrasound (echo/ vascular) testing. Due to room size and safety concerns, children are not allowed in the ultrasound rooms during exams. Our front office staff cannot provide observation of children in our lobby area while testing is being conducted. An adult accompanying a patient to their appointment will only be allowed in the ultrasound room at the discretion of the ultrasound technician under special circumstances. We apologize for any inconvenience.   Follow-Up: At [facility], you and your health needs are our priority.  As part of our continuing mission to provide you with exceptional heart care, our providers are all part of one team.  This team includes your primary Cardiologist (physician) and Advanced Practice Providers or APPs (Physician Assistants and Nurse Practitioners) who all work together to provide you with the care you need, when you need it.  Your next appointment:   3 MONTHS  Provider:   Darryle ONEIDA Decent, MD (ONLY)

## 2023-08-05 ENCOUNTER — Ambulatory Visit: Payer: Self-pay | Admitting: Physician Assistant

## 2023-08-08 ENCOUNTER — Encounter: Payer: Self-pay | Admitting: Internal Medicine

## 2023-08-08 ENCOUNTER — Ambulatory Visit (INDEPENDENT_AMBULATORY_CARE_PROVIDER_SITE_OTHER): Admitting: Internal Medicine

## 2023-08-08 ENCOUNTER — Encounter: Payer: Self-pay | Admitting: Physician Assistant

## 2023-08-08 VITALS — BP 124/82 | HR 65 | Ht 74.0 in | Wt 161.0 lb

## 2023-08-08 DIAGNOSIS — E113593 Type 2 diabetes mellitus with proliferative diabetic retinopathy without macular edema, bilateral: Secondary | ICD-10-CM

## 2023-08-08 DIAGNOSIS — E1159 Type 2 diabetes mellitus with other circulatory complications: Secondary | ICD-10-CM

## 2023-08-08 DIAGNOSIS — E1142 Type 2 diabetes mellitus with diabetic polyneuropathy: Secondary | ICD-10-CM | POA: Diagnosis not present

## 2023-08-08 DIAGNOSIS — Z794 Long term (current) use of insulin: Secondary | ICD-10-CM | POA: Diagnosis not present

## 2023-08-08 DIAGNOSIS — F3341 Major depressive disorder, recurrent, in partial remission: Secondary | ICD-10-CM

## 2023-08-08 DIAGNOSIS — E785 Hyperlipidemia, unspecified: Secondary | ICD-10-CM

## 2023-08-08 MED ORDER — GLIPIZIDE 5 MG PO TABS: 5.0000 mg | ORAL_TABLET | Freq: Two times a day (BID) | ORAL | 3 refills | Status: DC

## 2023-08-08 MED ORDER — SITAGLIPTIN PHOSPHATE 100 MG PO TABS: 100.0000 mg | ORAL_TABLET | Freq: Every day | ORAL | 3 refills | Status: DC

## 2023-08-08 NOTE — Patient Instructions (Signed)
 Decrease Glipizide  5 mg, 1 tablet daily  Continue Januvia  100 mg daily     HOW TO TREAT LOW BLOOD SUGARS (Blood sugar LESS THAN 70 MG/DL) Please follow the RULE OF 15 for the treatment of hypoglycemia treatment (when your (blood sugars are less than 70 mg/dL)   STEP 1: Take 15 grams of carbohydrates when your blood sugar is low, which includes:  3-4 GLUCOSE TABS  OR 3-4 OZ OF JUICE OR REGULAR SODA OR ONE TUBE OF GLUCOSE GEL    STEP 2: RECHECK blood sugar in 15 MINUTES STEP 3: If your blood sugar is still low at the 15 minute recheck --> then, go back to STEP 1 and treat AGAIN with another 15 grams of carbohydrates.

## 2023-08-08 NOTE — Telephone Encounter (Signed)
 Please see pt son msg regarding new wounds and advise

## 2023-08-08 NOTE — Progress Notes (Signed)
 Name: [name] [name]  Age/ Sex: 68 y.o., male   MRN/ DOB: [id number], [birth year]     PCP: [name] HERO, PA-C   Reason for Endocrinology Evaluation: Type 2 Diabetes Mellitus  Initial Endocrine Consultative Visit: 07/15/2020    PATIENT IDENTIFIER: Mr. Lief Palmatier is a 68 y.o. male with a past medical history of DM, CAD, HTN, PVD, PAF. The patient has followed with Endocrinology clinic since 07/15/2020 for consultative assistance with management of his diabetes.  DIABETIC HISTORY:  Mr. Bartoszek was diagnosed with DM in 2011, and started insulin  therapy in 2013 through his previous endocrinologist. His hemoglobin A1c has ranged from 9.2% in 2023, peaking at >15.0% in 2022.  Saw Dr. Kassie last in 04/2021    Insulin  pump was discontinued during hospitalization 11/2022 and was deemed not fit due to noncompliance.  He was discharged on metformin and Januvia   By 01/2023 I discontinued metformin and started him on glipizide  and increase Januvia    SUBJECTIVE:   During the last visit (02/14/2023): A1c 7.8%  Today (08/08/2023): Mr. Conran is here for a follow up on diabetes management.  He checks his blood sugars multiple  times daily.    Patient is scheduled for colonoscopy 09/09/2023 for further evaluation of chronic diarrhea He continues to follow-up with cardiology for CAD, A-fib, peripheral vascular disease, and CHF  Continues with occasional diarrhea - on imodium   No nausea or vomiting   HOME DIABETES REGIMEN:  Januvia  100 mg daily  Glipizide  10 mg, half  tab daily     Statin: yes ACE-I/ARB: yes    CONTINUOUS GLUCOSE MONITORING RECORD INTERPRETATION    Dates of Recording: 7/25-08/08/2023  Sensor description:dexcom  Results statistics:   CGM use % of time 91  Average and SD 188/46  Time in range 43%  % Time Above 180 48  % Time above 250 9  % Time Below target 0      Glycemic patterns summary: BG's are slightly elevated overnight, but increase more  throughout the day  Hyperglycemic episodes mostly during the day with meals  Hypoglycemic episodes occurred N/A  Overnight periods: Slightly above goal    DIABETIC COMPLICATIONS: Microvascular complications:  Retinopathy B/L , blind in left eye  Denies: CKD Last Eye Exam: Completed 05/11/2022  Macrovascular complications:  CAD (S/P CABG) Denies: CVA,   HISTORY:  Past Medical History:  Past Medical History:  Diagnosis Date   Anxiety    Arthritis    Cataract    CHF (congestive heart failure) (HCC)    Constipation    pt states goes EOD- hard stools    Coronary artery disease    Depression    Diabetes mellitus without complication (HCC)    Diabetic retinopathy (HCC)    DKA (diabetic ketoacidoses)    Hyperlipidemia    Hypertension    off meds   Hypertensive retinopathy    Lesion of ulnar nerve, left upper limb 07/12/2017   Non-intractable vomiting without nausea 06/10/2018   Postherpetic neuralgia 08/11/2021   Speculative diagnosis   Psoriasis    Tubular adenoma of colon 03/2019   Past Surgical History:  Past Surgical History:  Procedure Laterality Date   ABDOMINAL AORTOGRAM W/LOWER EXTREMITY N/A 08/30/2020   Procedure: ABDOMINAL AORTOGRAM W/LOWER EXTREMITY;  Surgeon: Serene Gaile ORN, MD;  Location: [facility] INVASIVE CV LAB;  Service: Cardiovascular;  Laterality: N/A;   ABDOMINAL AORTOGRAM W/LOWER EXTREMITY N/A 11/15/2020   Procedure: ABDOMINAL AORTOGRAM W/LOWER EXTREMITY;  Surgeon: Serene Gaile ORN, MD;  Location: [facility]  INVASIVE CV LAB;  Service: Cardiovascular;  Laterality: N/A;   ANKLE SURGERY     right   CARDIAC CATHETERIZATION  09/25/2001   COLONOSCOPY     ~10 yr ago- normal  per pt    CORONARY ARTERY BYPASS GRAFT  09/30/2001   CABG x 4   IR THORACENTESIS ASP PLEURAL SPACE W/IMG GUIDE  05/19/2020   IR THORACENTESIS ASP PLEURAL SPACE W/IMG GUIDE  02/04/2023   LAPAROSCOPIC INGUINAL HERNIA REPAIR Bilateral 02/09/2010   MASS EXCISION Left 03/12/2013   Procedure: LEFT INDEX  EXCISION MASS AND DEBRIDMENT DISTAL INTERPHALANGEAL JOINT;  Surgeon: Franky JONELLE Curia, MD;  Location: [facility];  Service: Orthopedics;  Laterality: Left;   PERIPHERAL VASCULAR BALLOON ANGIOPLASTY Left 08/30/2020   Procedure: PERIPHERAL VASCULAR BALLOON ANGIOPLASTY;  Surgeon: Serene Gaile ORN, MD;  Location: [facility] INVASIVE CV LAB;  Service: Cardiovascular;  Laterality: Left;  Peroneal   TRANSESOPHAGEAL ECHOCARDIOGRAM (CATH LAB) N/A 11/26/2022   Procedure: TRANSESOPHAGEAL ECHOCARDIOGRAM;  Surgeon: Kate Lonni CROME, MD;  [facility] INVASIVE CV LAB;  Service: Cardiovascular;  Laterality: N/A;   Social History:  reports that he has been smoking cigars. He has never used smokeless tobacco. He reports current alcohol use. He reports that he does not use drugs. Family History:  Family History  Problem Relation Age of Onset   Lymphoma Father    Cancer Paternal Grandmother        Colon Cancer   Colon cancer Paternal Grandmother    Colon polyps Neg Hx    Esophageal cancer Neg Hx    Rectal cancer Neg Hx    Stomach cancer Neg Hx    Diabetes Mellitus I Neg Hx    Pancreatic cancer Neg Hx      HOME MEDICATIONS: Allergies as of 08/08/2023       Reactions   Wool Alcohol [lanolin] Hives, Itching   Wool fabric        Medication List        Accurate as of August 08, 2023  8:17 AM. If you have any questions, ask your nurse or doctor.          acetaminophen  500 MG tablet Commonly known as: TYLENOL  Take 500 mg by mouth as needed for mild pain or moderate pain.   amLODipine  2.5 MG tablet Commonly known as: NORVASC  TAKE 1 TABLET BY MOUTH EVERY DAY   atorvastatin  40 MG tablet Commonly known as: LIPITOR TAKE 1 TABLET BY MOUTH EVERY DAY   brimonidine 0.2 % ophthalmic solution Commonly known as: ALPHAGAN 1 drop 2 (two) times daily.   Cholecalciferol  25 MCG (1000 UT) capsule Take 1,000 Units by mouth daily.   clobetasol  0.05 % external solution Commonly known as:  TEMOVATE  Apply 1 Application topically 2 (two) times daily. For scalp psoriasis.   Dexcom G6 Transmitter Misc USE AS INSTRUCTED TO CHECK BLOOD SUGAR.   Eliquis  5 MG Tabs tablet Generic drug: apixaban  TAKE 1 TABLET BY MOUTH TWICE A DAY   furosemide  20 MG tablet Commonly known as: LASIX  TAKE 1 TABLET BY MOUTH EVERY DAY   glipiZIDE  5 MG tablet Commonly known as: GLUCOTROL  Take 1 tablet (5 mg total) by mouth 2 (two) times daily before a meal. What changed:  medication strength how much to take when to take this Changed by: Donell PARAS Remijio Holleran   Gvoke HypoPen  2-Pack 1 MG/0.2ML Soaj Generic drug: Glucagon  Inject pen into upper thigh and hold it there for five seconds in the event of severe hypoglycemia.   ketoconazole   2 % shampoo Commonly known as: NIZORAL  Apply 1 Application topically 2 (two) times a week.   loperamide  2 MG capsule Commonly known as: IMODIUM  Take 1 capsule (2 mg total) by mouth 4 (four) times daily as needed for diarrhea or loose stools.   loratadine  10 MG tablet Commonly known as: CLARITIN  TAKE 1 TABLET BY MOUTH EVERY DAY   mirtazapine  15 MG tablet Commonly known as: REMERON  TAKE 1 TABLET BY MOUTH EVERYDAY AT BEDTIME   multivitamin with minerals tablet Take 1 tablet by mouth daily.   Na Sulfate-K Sulfate-Mg Sulfate concentrate 17.5-3.13-1.6 GM/177ML Soln Commonly known as: SUPREP SMARTSIG:1 Kit(s) By Mouth Once   PARoxetine  20 MG tablet Commonly known as: PAXIL  TAKE 1 TABLET ONCE A DAY FOR DEPRESSION   QUEtiapine  25 MG tablet Commonly known as: SEROQUEL  GIVE 1 TABLET BY MOUTH AT BEDTIME FOR DELIRIUM   sitaGLIPtin  100 MG tablet Commonly known as: Januvia  Take 1 tablet (100 mg total) by mouth daily.   spironolactone  25 MG tablet Commonly known as: ALDACTONE  Take 1 tablet (25 mg total) by mouth daily.         OBJECTIVE:   Vital Signs: BP 124/82 (BP Location: Left Arm, Patient Position: Sitting, Cuff Size: Normal)   Pulse 65   Ht 6'  2 (1.88 m)   Wt 161 lb (73 kg)   SpO2 96%   BMI 20.67 kg/m   Wt Readings from Last 3 Encounters:  08/08/23 161 lb (73 kg)  08/01/23 152 lb (68.9 kg)  07/26/23 151 lb 4 oz (68.6 kg)     Exam: General: Pt appears well and is in NAD  Lungs: Clear with good BS bilat   Heart: RRR   Neuro: MS is good with appropriate affect, pt is alert and Ox3   Dm Foot Exam 09/05/2022 per podiatry      DATA REVIEWED:  Lab Results  Component Value Date   HGBA1C 9.1 (H) 07/26/2023   HGBA1C 7.8 (H) 02/03/2023   HGBA1C 9.2 (H) 11/30/2022    Latest Reference Range & Units 07/26/23 09:19  Sodium 135 - 145 mEq/L 135  Potassium 3.5 - 5.1 mEq/L 5.2 No hemolysis seen (H)  Chloride 96 - 112 mEq/L 99  CO2 19 - 32 mEq/L 29  Glucose 70 - 99 mg/dL 776 (H)  BUN 6 - 23 mg/dL 31 (H)  Creatinine 9.59 - 1.50 mg/dL 8.93  Calcium  8.4 - 10.5 mg/dL 8.5  Alkaline Phosphatase 39 - 117 U/L 130 (H)  Albumin 3.5 - 5.2 g/dL 3.2 (L)  AST 0 - 37 U/L 15  ALT 0 - 53 U/L 11  Total Protein 6.0 - 8.3 g/dL 6.4  Total Bilirubin 0.2 - 1.2 mg/dL 0.5  GFR >39.99 mL/min 71.12     Old records , labs and images have been reviewed.    ASSESSMENT / PLAN / RECOMMENDATIONS:   1) Type 2 Diabetes Mellitus, Sub-optimally controlled, With retinopathic , neuropathic, retinopathic and macrovascular  complications - Most recent A1c of 9.1 %. Goal A1c < 7.0 %.     - A1c has trended up from 7.9% to 9.1% - Patient was having hypoglycemic episodes while on glipizide  10 mg and decreased appetite, we decreased glipizide  to half a tablet before breakfast - We discussed the importance of holding glipizide  if appetite is low to prevent hypoglycemia, which is difficult as the patient lives alone , and son is able to manage his medications but will create hardship to try to monitor intake and adjust glipizide   accordingly , but it appears that he recently his appetite has increased which resulted in hyperglycemia - I will change his  prescription of glipizide  from 10 mg to 5 mg - We entertain the idea of switching to XL formulation if unable to take glipizide  twice daily but the need arises for this.  MEDICATIONS: Decrease  glipizide  5 mg daily Continue Januvia  100 mg daily    EDUCATION / INSTRUCTIONS: BG monitoring instructions: Patient is instructed to check his blood sugars 4 times a day, before each meal and bedtime. Call [facility] if: BG persistently < 70  I reviewed the Rule of 15 for the treatment of hypoglycemia in detail with the patient. Literature supplied.     2) Diabetic complications:  Eye: Does  have known diabetic retinopathy.  Neuro/ Feet: Does have known diabetic peripheral neuropathy .  Renal: Patient does not have known baseline CKD. He   is  on an ACEI/ARB at present.    Follow-up in 4 months   Signed electronically by: Stefano Redgie Butts, MD  [facility]  [facility] [address] [place], [place] [address] Phone: [phone] FAX: [phone]   CC: [name], Mardy HERO, PA-C [address] [place] [address] Phone: [phone]  Fax: [phone]  Return to Endocrinology clinic as below: Future Appointments  Date Time Provider Department Center  09/19/2023  8:00 AM [facility]AS [facility]  10/28/2023  8:00 AM [name], Mardy HERO, PA-C [facility]  11/07/2023  8:00 AM O'Neal, Darryle Ned, MD [facility] H&V  12/02/2023  1:40 PM [facility] ANNUAL WELLNESS VISIT 1 [facility]  12/24/2023 11:00 AM Sherryl Bouchard, NP [facility] None

## 2023-08-08 NOTE — Telephone Encounter (Signed)
 PT son, [name] is calling to have colonosopy rescheduled. Unfortunately 9/8 is not a good day for him. Please advise.

## 2023-08-08 NOTE — Telephone Encounter (Signed)
 Please see pt response and advise if appt needs to be scheduled

## 2023-08-09 ENCOUNTER — Telehealth: Payer: Self-pay | Admitting: Nurse Practitioner

## 2023-08-09 NOTE — Telephone Encounter (Signed)
 Dr. Leigh, please review cardiac clearance per [facility] cardiology NP 08/01/2023 and verify if you are in agreement for patient to proceed with an EGD and colonoscopy with you at [facility] on 09/09/2023.  Preoperative cardiovascular exam According to the Revised Cardiac Risk Index (RCRI), his Perioperative Risk of Major Cardiac Event is (%): 11. His Functional Capacity in METs is: 4.4 according to the Duke Activity Status Index (DASI). Therefore, based on ACC/AHA guidelines, patient would be at acceptable risk for the planned procedure without further cardiovascular testing. I will route this recommendation to the requesting party via Epic fax function.   Given his current medical history he is high risk for procedures   He is pending vitrectomy with endolaser membrane peel right eye with retina diabetic Center on date TBD   He is pending colonoscopy and endoscopy with [facility] on date TBD  Per office protocol, patient can hold Eliquis  for 2 days prior to procedure.   Patient will not need bridging with Lovenox (enoxaparin) around procedure.  SABRA

## 2023-08-09 NOTE — Telephone Encounter (Signed)
Forwarding to Alyssa to review and advise.

## 2023-08-09 NOTE — Telephone Encounter (Signed)
 See pt son reply

## 2023-08-09 NOTE — Telephone Encounter (Signed)
 Thanks [employer]. Looks like he is scheduled for this on 9/8. Jan he is cleared to hold Eliquis  for 2 days, if you can confirm with the patient as it gets closer to the procedure. Thanks

## 2023-08-12 ENCOUNTER — Other Ambulatory Visit: Payer: Self-pay

## 2023-08-12 DIAGNOSIS — L89306 Pressure-induced deep tissue damage of unspecified buttock: Secondary | ICD-10-CM

## 2023-08-13 NOTE — Telephone Encounter (Signed)
 [name] [name], thanks for the update, please keep track and let me know when procedures scheduled. THX.

## 2023-08-13 NOTE — Telephone Encounter (Signed)
 MyChart message to patient to hold Eliquis  for 2 days prior to procedure on 9-8

## 2023-08-13 NOTE — Telephone Encounter (Signed)
 Called and spoke to patient's son to clarify they got the message regarding holding  Eliquis  for 2 days prior to procedure. He expressed understanding.  He mentioned he heard from someone in our office yesterday who indicated the procedure would be rescheduled to 10-14. He thinks they are supposed to be there around 8:00 am but he was not sure. Procedure is not scheduled yet so I could not provide him with an exact time. I let him know whoever contacted him regarding the date will confirm the time once it is scheduled.

## 2023-08-15 ENCOUNTER — Ambulatory Visit: Payer: PPO | Admitting: Adult Health

## 2023-08-15 NOTE — Telephone Encounter (Signed)
 Got it, thanks for letting me know

## 2023-08-15 NOTE — Telephone Encounter (Signed)
 Dr. Leigh. FYI

## 2023-08-16 ENCOUNTER — Telehealth: Payer: Self-pay

## 2023-08-16 DIAGNOSIS — R748 Abnormal levels of other serum enzymes: Secondary | ICD-10-CM

## 2023-08-16 DIAGNOSIS — R634 Abnormal weight loss: Secondary | ICD-10-CM

## 2023-08-16 DIAGNOSIS — D649 Anemia, unspecified: Secondary | ICD-10-CM

## 2023-08-16 MED ORDER — NA SULFATE-K SULFATE-MG SULF 17.5-3.13-1.6 GM/177ML PO SOLN: 1.0000 | Freq: Once | ORAL | 0 refills | Status: AC

## 2023-08-16 NOTE — Telephone Encounter (Signed)
 Good Morning, I have rescheduled this patient from 09/09/23 to 10/15/23. I received clearance from Cardiology for 09/09/23 and in a previous telephone encounter Elida stated the following:  Preoperative cardiovascular exam According to the Revised Cardiac Risk Index (RCRI), his Perioperative Risk of Major Cardiac Event is (%): 11. His Functional Capacity in METs is: 4.4 according to the Duke Activity Status Index (DASI). Therefore, based on ACC/AHA guidelines, patient would be at acceptable risk for the planned procedure without further cardiovascular testing. I will route this recommendation to the requesting party via Epic fax function.   Given his current medical history he is high risk for procedures   He is pending vitrectomy with endolaser membrane peel right eye with retina diabetic Center on date TBD   He is pending colonoscopy and endoscopy with [facility] on date TBD  Per office protocol, patient can hold Eliquis  for 2 days prior to procedure.   Patient will not need bridging with Lovenox (enoxaparin) around procedure.  Please advise as to whether or not you would like for me to receive clearance for this new date.

## 2023-08-16 NOTE — Telephone Encounter (Signed)
 Hi, thanks. This is sufficient for clearance given we are only changing his date by about 1 month. Assuming the patient is stable and no recurrent cardiac issues in the interim. Thanks

## 2023-08-18 ENCOUNTER — Inpatient Hospital Stay (HOSPITAL_COMMUNITY)

## 2023-08-18 ENCOUNTER — Ambulatory Visit
Admission: EM | Admit: 2023-08-18 | Discharge: 2023-08-18 | Disposition: A | Discharge status: Other Acute Inpatient Hospital

## 2023-08-18 ENCOUNTER — Encounter (HOSPITAL_COMMUNITY): Payer: Self-pay | Admitting: Internal Medicine

## 2023-08-18 ENCOUNTER — Inpatient Hospital Stay (HOSPITAL_BASED_OUTPATIENT_CLINIC_OR_DEPARTMENT_OTHER)
Admission: EM | Admit: 2023-08-18 | Discharge: 2023-08-22 | DRG: 291 | Disposition: A | Discharge status: Home Health | Attending: Internal Medicine | Admitting: Internal Medicine

## 2023-08-18 ENCOUNTER — Other Ambulatory Visit: Payer: Self-pay

## 2023-08-18 ENCOUNTER — Emergency Department (HOSPITAL_BASED_OUTPATIENT_CLINIC_OR_DEPARTMENT_OTHER)

## 2023-08-18 DIAGNOSIS — Z79899 Other long term (current) drug therapy: Secondary | ICD-10-CM

## 2023-08-18 DIAGNOSIS — I5033 Acute on chronic diastolic (congestive) heart failure: Secondary | ICD-10-CM | POA: Diagnosis present

## 2023-08-18 DIAGNOSIS — R441 Visual hallucinations: Secondary | ICD-10-CM | POA: Diagnosis not present

## 2023-08-18 DIAGNOSIS — Z7984 Long term (current) use of oral hypoglycemic drugs: Secondary | ICD-10-CM

## 2023-08-18 DIAGNOSIS — E8809 Other disorders of plasma-protein metabolism, not elsewhere classified: Secondary | ICD-10-CM | POA: Diagnosis present

## 2023-08-18 DIAGNOSIS — I480 Paroxysmal atrial fibrillation: Secondary | ICD-10-CM | POA: Diagnosis present

## 2023-08-18 DIAGNOSIS — E1169 Type 2 diabetes mellitus with other specified complication: Secondary | ICD-10-CM | POA: Diagnosis present

## 2023-08-18 DIAGNOSIS — E785 Hyperlipidemia, unspecified: Secondary | ICD-10-CM | POA: Diagnosis not present

## 2023-08-18 DIAGNOSIS — I2510 Atherosclerotic heart disease of native coronary artery without angina pectoris: Secondary | ICD-10-CM | POA: Diagnosis present

## 2023-08-18 DIAGNOSIS — L89159 Pressure ulcer of sacral region, unspecified stage: Secondary | ICD-10-CM | POA: Diagnosis present

## 2023-08-18 DIAGNOSIS — I739 Peripheral vascular disease, unspecified: Secondary | ICD-10-CM | POA: Diagnosis not present

## 2023-08-18 DIAGNOSIS — I5082 Biventricular heart failure: Secondary | ICD-10-CM | POA: Diagnosis present

## 2023-08-18 DIAGNOSIS — I50811 Acute right heart failure: Principal | ICD-10-CM

## 2023-08-18 DIAGNOSIS — R4182 Altered mental status, unspecified: Secondary | ICD-10-CM | POA: Insufficient documentation

## 2023-08-18 DIAGNOSIS — E113513 Type 2 diabetes mellitus with proliferative diabetic retinopathy with macular edema, bilateral: Secondary | ICD-10-CM | POA: Diagnosis not present

## 2023-08-18 DIAGNOSIS — Z862 Personal history of diseases of the blood and blood-forming organs and certain disorders involving the immune mechanism: Secondary | ICD-10-CM | POA: Diagnosis not present

## 2023-08-18 DIAGNOSIS — I110 Hypertensive heart disease with heart failure: Secondary | ICD-10-CM | POA: Diagnosis present

## 2023-08-18 DIAGNOSIS — R7989 Other specified abnormal findings of blood chemistry: Secondary | ICD-10-CM | POA: Insufficient documentation

## 2023-08-18 DIAGNOSIS — D509 Iron deficiency anemia, unspecified: Secondary | ICD-10-CM | POA: Diagnosis present

## 2023-08-18 DIAGNOSIS — F32A Depression, unspecified: Secondary | ICD-10-CM | POA: Diagnosis present

## 2023-08-18 DIAGNOSIS — M25471 Effusion, right ankle: Secondary | ICD-10-CM

## 2023-08-18 DIAGNOSIS — M25472 Effusion, left ankle: Secondary | ICD-10-CM

## 2023-08-18 DIAGNOSIS — F411 Generalized anxiety disorder: Secondary | ICD-10-CM | POA: Diagnosis present

## 2023-08-18 DIAGNOSIS — Z807 Family history of other malignant neoplasms of lymphoid, hematopoietic and related tissues: Secondary | ICD-10-CM | POA: Diagnosis not present

## 2023-08-18 DIAGNOSIS — Z7901 Long term (current) use of anticoagulants: Secondary | ICD-10-CM | POA: Diagnosis not present

## 2023-08-18 DIAGNOSIS — J90 Pleural effusion, not elsewhere classified: Secondary | ICD-10-CM | POA: Diagnosis not present

## 2023-08-18 DIAGNOSIS — I160 Hypertensive urgency: Secondary | ICD-10-CM | POA: Diagnosis present

## 2023-08-18 DIAGNOSIS — I4819 Other persistent atrial fibrillation: Secondary | ICD-10-CM | POA: Diagnosis present

## 2023-08-18 DIAGNOSIS — M25474 Effusion, right foot: Secondary | ICD-10-CM

## 2023-08-18 DIAGNOSIS — E7800 Pure hypercholesterolemia, unspecified: Secondary | ICD-10-CM | POA: Diagnosis present

## 2023-08-18 DIAGNOSIS — F419 Anxiety disorder, unspecified: Secondary | ICD-10-CM | POA: Diagnosis present

## 2023-08-18 DIAGNOSIS — E113593 Type 2 diabetes mellitus with proliferative diabetic retinopathy without macular edema, bilateral: Secondary | ICD-10-CM | POA: Diagnosis present

## 2023-08-18 DIAGNOSIS — F17290 Nicotine dependence, other tobacco product, uncomplicated: Secondary | ICD-10-CM | POA: Diagnosis present

## 2023-08-18 DIAGNOSIS — I2489 Other forms of acute ischemic heart disease: Secondary | ICD-10-CM | POA: Diagnosis present

## 2023-08-18 DIAGNOSIS — L89151 Pressure ulcer of sacral region, stage 1: Secondary | ICD-10-CM | POA: Diagnosis not present

## 2023-08-18 DIAGNOSIS — Z800 Family history of malignant neoplasm of digestive organs: Secondary | ICD-10-CM | POA: Diagnosis not present

## 2023-08-18 DIAGNOSIS — Z794 Long term (current) use of insulin: Secondary | ICD-10-CM

## 2023-08-18 DIAGNOSIS — M25475 Effusion, left foot: Secondary | ICD-10-CM

## 2023-08-18 DIAGNOSIS — Z8679 Personal history of other diseases of the circulatory system: Secondary | ICD-10-CM

## 2023-08-18 DIAGNOSIS — I509 Heart failure, unspecified: Secondary | ICD-10-CM | POA: Insufficient documentation

## 2023-08-18 DIAGNOSIS — R410 Disorientation, unspecified: Secondary | ICD-10-CM | POA: Diagnosis not present

## 2023-08-18 DIAGNOSIS — E1151 Type 2 diabetes mellitus with diabetic peripheral angiopathy without gangrene: Secondary | ICD-10-CM | POA: Diagnosis present

## 2023-08-18 DIAGNOSIS — Z951 Presence of aortocoronary bypass graft: Secondary | ICD-10-CM

## 2023-08-18 DIAGNOSIS — E11319 Type 2 diabetes mellitus with unspecified diabetic retinopathy without macular edema: Secondary | ICD-10-CM | POA: Diagnosis present

## 2023-08-18 DIAGNOSIS — Z9862 Peripheral vascular angioplasty status: Secondary | ICD-10-CM

## 2023-08-18 HISTORY — DX: Altered mental status, unspecified: R41.82

## 2023-08-18 LAB — URINALYSIS, ROUTINE W REFLEX MICROSCOPIC
Bacteria, UA: NONE SEEN
Bilirubin Urine: NEGATIVE
Glucose, UA: NEGATIVE mg/dL
Ketones, ur: NEGATIVE mg/dL
Leukocytes,Ua: NEGATIVE
Nitrite: NEGATIVE
Protein, ur: 100 mg/dL — AB
Specific Gravity, Urine: 1.01 (ref 1.005–1.030)
pH: 6.5 (ref 5.0–8.0)

## 2023-08-18 LAB — CBC WITH DIFFERENTIAL/PLATELET
Abs Immature Granulocytes: 0.02 K/uL (ref 0.00–0.07)
Basophils Absolute: 0 K/uL (ref 0.0–0.1)
Basophils Relative: 1 %
Eosinophils Absolute: 0.1 K/uL (ref 0.0–0.5)
Eosinophils Relative: 2 %
HCT: 30.9 % — ABNORMAL LOW (ref 39.0–52.0)
Hemoglobin: 9.8 g/dL — ABNORMAL LOW (ref 13.0–17.0)
Immature Granulocytes: 0 %
Lymphocytes Relative: 16 %
Lymphs Abs: 0.9 K/uL (ref 0.7–4.0)
MCH: 27.6 pg (ref 26.0–34.0)
MCHC: 31.7 g/dL (ref 30.0–36.0)
MCV: 87 fL (ref 80.0–100.0)
Monocytes Absolute: 0.6 K/uL (ref 0.1–1.0)
Monocytes Relative: 10 %
Neutro Abs: 3.9 K/uL (ref 1.7–7.7)
Neutrophils Relative %: 71 %
Platelets: 214 K/uL (ref 150–400)
RBC: 3.55 MIL/uL — ABNORMAL LOW (ref 4.22–5.81)
RDW: 18.9 % — ABNORMAL HIGH (ref 11.5–15.5)
WBC: 5.5 K/uL (ref 4.0–10.5)
nRBC: 0 % (ref 0.0–0.2)

## 2023-08-18 LAB — COMPREHENSIVE METABOLIC PANEL WITH GFR
ALT: 12 U/L (ref 0–44)
AST: 21 U/L (ref 15–41)
Albumin: 3.4 g/dL — ABNORMAL LOW (ref 3.5–5.0)
Alkaline Phosphatase: 169 U/L — ABNORMAL HIGH (ref 38–126)
Anion gap: 12 (ref 5–15)
BUN: 28 mg/dL — ABNORMAL HIGH (ref 8–23)
CO2: 25 mmol/L (ref 22–32)
Calcium: 8.9 mg/dL (ref 8.9–10.3)
Chloride: 102 mmol/L (ref 98–111)
Creatinine, Ser: 0.96 mg/dL (ref 0.61–1.24)
GFR, Estimated: >60 mL/min (ref >60)
Glucose, Bld: 194 mg/dL — ABNORMAL HIGH (ref 70–99)
Potassium: 3.7 mmol/L (ref 3.5–5.1)
Sodium: 140 mmol/L (ref 135–145)
Total Bilirubin: 0.8 mg/dL (ref 0.0–1.2)
Total Protein: 7.1 g/dL (ref 6.5–8.1)

## 2023-08-18 LAB — PRO BRAIN NATRIURETIC PEPTIDE: Pro Brain Natriuretic Peptide: 3796 pg/mL — ABNORMAL HIGH (ref <300.0)

## 2023-08-18 LAB — TROPONIN I (HIGH SENSITIVITY): Troponin I (High Sensitivity): 16 ng/L (ref <18)

## 2023-08-18 LAB — TROPONIN T, HIGH SENSITIVITY: Troponin T High Sensitivity: 52 ng/L — ABNORMAL HIGH (ref 0–19)

## 2023-08-18 MED ORDER — ACETAMINOPHEN 650 MG RE SUPP: 650.0000 mg | Freq: Four times a day (QID) | RECTAL | Status: DC | PRN

## 2023-08-18 MED ORDER — LINAGLIPTIN 5 MG PO TABS
5.0000 mg | ORAL_TABLET | Freq: Every day | ORAL | Status: DC
  Administered 2023-08-19 – 2023-08-21 (×3): 5 mg via ORAL
  Filled 2023-08-18 (×4): qty 1

## 2023-08-18 MED ORDER — ONDANSETRON HCL 4 MG PO TABS: 4.0000 mg | ORAL_TABLET | Freq: Four times a day (QID) | ORAL | Status: DC | PRN

## 2023-08-18 MED ORDER — SODIUM CHLORIDE 0.9% FLUSH: 3.0000 mL | INTRAVENOUS | Status: DC | PRN

## 2023-08-18 MED ORDER — PAROXETINE HCL 20 MG PO TABS
20.0000 mg | ORAL_TABLET | Freq: Every day | ORAL | Status: DC
  Administered 2023-08-19 – 2023-08-22 (×4): 20 mg via ORAL
  Filled 2023-08-18 (×4): qty 1
  Filled 2023-08-18 (×3): qty 2

## 2023-08-18 MED ORDER — HYDRALAZINE HCL 20 MG/ML IJ SOLN
10.0000 mg | Freq: Once | INTRAMUSCULAR | Status: AC
  Administered 2023-08-18: 10 mg via INTRAVENOUS
  Filled 2023-08-18: qty 1

## 2023-08-18 MED ORDER — PREDNISOLONE ACETATE 1 % OP SUSP
1.0000 [drp] | Freq: Four times a day (QID) | OPHTHALMIC | Status: DC
  Administered 2023-08-19 – 2023-08-22 (×13): 1 [drp] via OPHTHALMIC
  Filled 2023-08-18: qty 5

## 2023-08-18 MED ORDER — OFLOXACIN 0.3 % OP SOLN
1.0000 [drp] | Freq: Four times a day (QID) | OPHTHALMIC | Status: DC
  Administered 2023-08-19 – 2023-08-22 (×13): 1 [drp] via OPHTHALMIC
  Filled 2023-08-18: qty 5

## 2023-08-18 MED ORDER — MIRTAZAPINE 15 MG PO TABS
15.0000 mg | ORAL_TABLET | Freq: Every day | ORAL | Status: DC
  Administered 2023-08-18 – 2023-08-21 (×4): 15 mg via ORAL
  Filled 2023-08-18 (×4): qty 1

## 2023-08-18 MED ORDER — FUROSEMIDE 10 MG/ML IJ SOLN
40.0000 mg | Freq: Two times a day (BID) | INTRAMUSCULAR | Status: DC
  Administered 2023-08-18 – 2023-08-19 (×2): 40 mg via INTRAVENOUS
  Filled 2023-08-18 (×2): qty 4

## 2023-08-18 MED ORDER — AMLODIPINE BESYLATE 2.5 MG PO TABS: 2.5000 mg | ORAL_TABLET | Freq: Every day | ORAL | Status: DC

## 2023-08-18 MED ORDER — GLIPIZIDE 5 MG PO TABS
5.0000 mg | ORAL_TABLET | Freq: Two times a day (BID) | ORAL | Status: DC
  Administered 2023-08-19 – 2023-08-20 (×4): 5 mg via ORAL
  Filled 2023-08-18 (×6): qty 1

## 2023-08-18 MED ORDER — HYDRALAZINE HCL 20 MG/ML IJ SOLN
10.0000 mg | Freq: Four times a day (QID) | INTRAMUSCULAR | Status: DC | PRN
  Administered 2023-08-18 – 2023-08-20 (×2): 10 mg via INTRAVENOUS
  Filled 2023-08-18 (×3): qty 1

## 2023-08-18 MED ORDER — ACETAMINOPHEN 325 MG PO TABS: 650.0000 mg | ORAL_TABLET | Freq: Four times a day (QID) | ORAL | Status: DC | PRN

## 2023-08-18 MED ORDER — QUETIAPINE FUMARATE 25 MG PO TABS
25.0000 mg | ORAL_TABLET | Freq: Every day | ORAL | Status: DC
  Administered 2023-08-18: 25 mg via ORAL
  Filled 2023-08-18: qty 1

## 2023-08-18 MED ORDER — SPIRONOLACTONE 25 MG PO TABS
25.0000 mg | ORAL_TABLET | Freq: Every day | ORAL | Status: DC
  Administered 2023-08-19 – 2023-08-22 (×4): 25 mg via ORAL
  Filled 2023-08-18 (×4): qty 1

## 2023-08-18 MED ORDER — AMLODIPINE BESYLATE 5 MG PO TABS: 5.0000 mg | ORAL_TABLET | Freq: Every day | ORAL | Status: DC

## 2023-08-18 MED ORDER — ONDANSETRON HCL 4 MG/2ML IJ SOLN
4.0000 mg | Freq: Four times a day (QID) | INTRAMUSCULAR | Status: DC | PRN
  Administered 2023-08-18: 4 mg via INTRAVENOUS
  Filled 2023-08-18: qty 2

## 2023-08-18 MED ORDER — AMLODIPINE BESYLATE 2.5 MG PO TABS
2.5000 mg | ORAL_TABLET | Freq: Two times a day (BID) | ORAL | Status: DC
  Administered 2023-08-18 – 2023-08-22 (×8): 2.5 mg via ORAL
  Filled 2023-08-18 (×8): qty 1

## 2023-08-18 MED ORDER — FUROSEMIDE 10 MG/ML IJ SOLN
40.0000 mg | Freq: Once | INTRAMUSCULAR | Status: AC
  Administered 2023-08-18: 40 mg via INTRAVENOUS
  Filled 2023-08-18: qty 4

## 2023-08-18 MED ORDER — ORAL CARE MOUTH RINSE: 15.0000 mL | OROMUCOSAL | Status: DC | PRN

## 2023-08-18 MED ORDER — SODIUM CHLORIDE 0.9 % IV SOLN: 250.0000 mL | INTRAVENOUS | Status: AC | PRN

## 2023-08-18 MED ORDER — SODIUM CHLORIDE 0.9% FLUSH
3.0000 mL | Freq: Two times a day (BID) | INTRAVENOUS | Status: DC
  Administered 2023-08-18 – 2023-08-22 (×8): 3 mL via INTRAVENOUS

## 2023-08-18 MED ORDER — APIXABAN 5 MG PO TABS
5.0000 mg | ORAL_TABLET | Freq: Two times a day (BID) | ORAL | Status: DC
  Administered 2023-08-18 – 2023-08-22 (×8): 5 mg via ORAL
  Filled 2023-08-18 (×8): qty 1

## 2023-08-18 MED ORDER — ATORVASTATIN CALCIUM 40 MG PO TABS
40.0000 mg | ORAL_TABLET | Freq: Every day | ORAL | Status: DC
  Administered 2023-08-18 – 2023-08-22 (×5): 40 mg via ORAL
  Filled 2023-08-18 (×5): qty 1

## 2023-08-18 NOTE — ED Notes (Signed)
 Unable to print [name] quest label due to lab being in chart. Pt stated name and DOB. Pt label on urine sample

## 2023-08-18 NOTE — ED Notes (Signed)
 Report given to the Floor RN.

## 2023-08-18 NOTE — ED Triage Notes (Addendum)
 [facility] with co of hallucinations since yesterday. Pt has had uti in the past that caused these same symptoms however before he was not aware that it was a hallucination like he his this time. Pt endorses difficulty urinating denies dysuria.    Pt reports he is also concerned about a pressure ulcer. Pt son reports he has had it about 1.5 weeks and has been in contact with his primary who recommended keeping area clean, son reports this is not ideal for his incontinence and difficulty showering at his current residence has not been evaluated in about 1.5 weeks.   Pt also endorses  bilateral leg swelling. Known history of heart failure.  Pt endorses he recently used a lot of salt which he thinks has contributed to the swelling. Son reports about 1.5 weeks he has noticed the swelling.   Pt is postoperative retina surgery and right eye is bright red. Pt declines any issues with this.

## 2023-08-18 NOTE — ED Triage Notes (Signed)
 Pt POV with family reporting multiple concerns:   Hallucinations, concerned for UTI, seen at [facility], unable to provide urine sample.  2. Pressure ulcer on buttocks not properly healing 3. Increased bilateral leg swelling, hx CHF, denies SOB.  4. Hypertension, BP 192/86 in triage, taking meds as prescribed

## 2023-08-18 NOTE — ED Notes (Signed)
 Trop reschedule for 1810... [facility] currently here and report given.SABRASABRA

## 2023-08-18 NOTE — ED Provider Notes (Signed)
 [place] EMERGENCY DEPARTMENT AT [facility] Provider Note   CSN: [id number] Arrival date & time: 08/18/23  1414     Patient presents with: Hypertension, Leg Swelling, and Wound Check   [name] [name] is a 68 y.o. male.   Patient here with concern for UTI, pressure wound on his buttocks, concern for volume overload.  Blood pressure has been elevated.  History of diabetes high cholesterol CAD hypertension CHF.  Lives by himself.  Family concern for UTI because has been having some hallucinations but he seems to be doing well now.  No fever.  He has had a wound on his low back that is been irritating him as well.  He is notice that his legs are more swollen than normal.  No chest pain.  He has been able to ambulate with his walker.  Denies any abdominal pain chest pain.  The history is provided by the patient.       Prior to Admission medications   Medication Sig Start Date End Date Taking? Authorizing Provider  acetaminophen  (TYLENOL ) 500 MG tablet Take 500 mg by mouth as needed for mild pain or moderate pain.    [provider]  amLODipine  (NORVASC ) 2.5 MG tablet TAKE 1 TABLET BY MOUTH EVERY DAY 06/27/23   [name], [name], PA-C  atorvastatin  (LIPITOR) 40 MG tablet TAKE 1 TABLET BY MOUTH EVERY DAY 03/21/23   [name], [name], PA-C  brimonidine (ALPHAGAN) 0.2 % ophthalmic solution 1 drop 2 (two) times daily. 03/15/23   [provider]  Cholecalciferol  25 MCG (1000 UT) capsule Take 1,000 Units by mouth daily.    [provider]  clobetasol  (TEMOVATE ) 0.05 % external solution Apply 1 Application topically 2 (two) times daily. For scalp psoriasis. 07/29/23   [name], [name], PA-C  Continuous Glucose Transmitter (DEXCOM G6 TRANSMITTER) MISC USE AS INSTRUCTED TO CHECK BLOOD SUGAR. 07/15/23   Shamleffer, Donell Cardinal, MD  ELIQUIS  5 MG TABS tablet TAKE 1 TABLET BY MOUTH TWICE A DAY 04/19/23   O'Neal, Darryle Ned, MD  furosemide  (LASIX ) 20 MG  tablet TAKE 1 TABLET BY MOUTH EVERY DAY 04/08/23   [name], [name], PA-C  glipiZIDE  (GLUCOTROL ) 5 MG tablet Take 1 tablet (5 mg total) by mouth 2 (two) times daily before a meal. 08/08/23   Shamleffer, Donell Cardinal, MD  Glucagon  (GVOKE HYPOPEN  2-PACK) 1 MG/0.2ML SOAJ Inject pen into upper thigh and hold it there for five seconds in the event of severe hypoglycemia. 07/29/23   [name], [name], PA-C  ketoconazole  (NIZORAL ) 2 % shampoo Apply 1 Application topically 2 (two) times a week. 07/29/23   [name], [name], PA-C  loperamide  (IMODIUM ) 2 MG capsule Take 1 capsule (2 mg total) by mouth 4 (four) times daily as needed for diarrhea or loose stools. 01/01/23   Wendolyn Jenkins Jansky, MD  loratadine  (CLARITIN ) 10 MG tablet TAKE 1 TABLET BY MOUTH EVERY DAY 05/30/23   [name], [name], PA-C  mirtazapine  (REMERON ) 15 MG tablet TAKE 1 TABLET BY MOUTH EVERYDAY AT BEDTIME 04/23/23   [name], [name], PA-C  Multiple Vitamins-Minerals (MULTIVITAMIN WITH MINERALS) tablet Take 1 tablet by mouth daily.    [provider]  Na Sulfate-K Sulfate-Mg Sulfate concentrate (SUPREP) 17.5-3.13-1.6 GM/177ML SOLN SMARTSIG:1 Kit(s) By Mouth Once 06/06/23   [provider]  PARoxetine  (PAXIL ) 20 MG tablet TAKE 1 TABLET ONCE A DAY FOR DEPRESSION 05/10/23   [name], [name], PA-C  QUEtiapine  (SEROQUEL ) 25 MG tablet GIVE 1 TABLET BY MOUTH  AT BEDTIME FOR DELIRIUM 06/18/23   [name], Mardy HERO, PA-C  sitaGLIPtin  (JANUVIA ) 100 MG tablet Take 1 tablet (100 mg total) by mouth daily. 08/08/23   Shamleffer, Ibtehal Jaralla, MD  spironolactone  (ALDACTONE ) 25 MG tablet Take 1 tablet (25 mg total) by mouth daily. 03/15/23 07/26/23  Barbaraann Darryle Ned, MD    Allergies: Wool alcohol [lanolin]    Review of Systems  Updated Vital Signs BP (!) 202/81 (BP Location: Right Arm)   Pulse (!) 50   Temp 98 F (36.7 C)   Resp 16   Ht 6' 2 (1.88 m)   Wt 72.6 kg   SpO2 96%   BMI 20.54 kg/m   Physical Exam Vitals and nursing  note reviewed.  Constitutional:      General: He is not in acute distress.    Appearance: He is well-developed. He is not ill-appearing.  HENT:     Head: Normocephalic and atraumatic.     Nose: Nose normal.     Mouth/Throat:     Mouth: Mucous membranes are moist.  Eyes:     Extraocular Movements: Extraocular movements intact.     Conjunctiva/sclera: Conjunctivae normal.     Pupils: Pupils are equal, round, and reactive to light.  Cardiovascular:     Rate and Rhythm: Normal rate and regular rhythm.     Heart sounds: No murmur heard. Pulmonary:     Effort: Pulmonary effort is normal. No respiratory distress.     Breath sounds: Normal breath sounds.  Abdominal:     Palpations: Abdomen is soft.     Tenderness: There is no abdominal tenderness.  Musculoskeletal:        General: No swelling.     Cervical back: Normal range of motion and neck supple.     Right lower leg: Edema present.     Left lower leg: Edema present.     Comments: 2+ pitting edema bilaterally in his legs  Skin:    General: Skin is warm and dry.     Capillary Refill: Capillary refill takes less than 2 seconds.     Comments: Ulcer to his sacral area with overall clean margins no major purulent drainage  Neurological:     General: No focal deficit present.     Mental Status: He is alert and oriented to person, place, and time.  Psychiatric:        Mood and Affect: Mood normal.     (all labs ordered are listed, but only abnormal results are displayed) Labs Reviewed  COMPREHENSIVE METABOLIC PANEL WITH GFR - Abnormal; Notable for the following components:      Result Value   Glucose, Bld 194 (*)    BUN 28 (*)    Albumin 3.4 (*)    Alkaline Phosphatase 169 (*)    All other components within normal limits  CBC WITH DIFFERENTIAL/PLATELET - Abnormal; Notable for the following components:   RBC 3.55 (*)    Hemoglobin 9.8 (*)    HCT 30.9 (*)    RDW 18.9 (*)    All other components within normal limits  PRO  BRAIN NATRIURETIC PEPTIDE - Abnormal; Notable for the following components:   Pro Brain Natriuretic Peptide 3,796.0 (*)    All other components within normal limits  TROPONIN T, HIGH SENSITIVITY - Abnormal; Notable for the following components:   Troponin T High Sensitivity 52 (*)    All other components within normal limits  URINALYSIS, ROUTINE W REFLEX MICROSCOPIC  TROPONIN T, HIGH SENSITIVITY  EKG: EKG Interpretation Date/Time:  Sunday August 18 2023 15:59:16 EDT Ventricular Rate:  52 PR Interval:    QRS Duration:  112 QT Interval:  492 QTC Calculation: 458 R Axis:   43  Text Interpretation: Atrial fibrillation Borderline intraventricular conduction delay Confirmed by Ruthe Cornet [phone]) on 08/18/2023 4:24:10 PM  Radiology: [facility] Chest Portable 1 View Result Date: 08/18/2023 CLINICAL DATA:  Short of breath, CHF EXAM: PORTABLE CHEST 1 VIEW COMPARISON:  03/01/2023 FINDINGS: Single frontal view of the chest demonstrates postsurgical changes from CABG. Cardiac silhouette is stable. Persistent bibasilar consolidation and effusions, right greater than left, not appreciably changed since prior exam. Central pulmonary vascular congestion unchanged. No pneumothorax. IMPRESSION: 1. Stable pulmonary vascular congestion, bibasilar consolidation, and bilateral pleural effusions. Electronically Signed   By: Ozell Daring M.D.   On: 08/18/2023 16:21     Procedures   Medications Ordered in the ED  furosemide  (LASIX ) injection 40 mg (has no administration in time range)  hydrALAZINE  (APRESOLINE ) injection 10 mg (has no administration in time range)                                    Medical Decision Making Amount and/or Complexity of Data Reviewed Labs: ordered. Radiology: ordered.  Risk Prescription drug management. Decision regarding hospitalization.   Ermine Percival Glasheen is here with multiple issues.  Concern for UTI concern for leg swelling bilaterally and volume overload  concern for high blood pressure concern for ulcer on his buttocks.  He has a history of hypertension A-fib on Eliquis .  History of CHF.  History of diabetes.  Overall he looks unkept.  He has a sacral ulcer that overall looks well but is painful.  He looks volume overloaded on exam.  He is not having any major respiratory symptoms.  He is hypertensive.  Differential includes UTI versus volume overload versus other metabolic process.  He has no fever.  Will do broad workup with CBC BMP troponin BNP chest x-ray urinalysis.  Overall chest x-ray concerning for volume overload.  BNP is 3796.  Troponin 52.  Overall I think he has volume overload from uncontrolled hypertension.  Patient given a dose of IV hydralazine  given a dose of IV Lasix .  Will admit for further care.  Overall he probably benefit from wound evaluation of his sacral wound.  Dressing has been placed.  Will follow-up urinalysis to see if we need to do antibiotic for that.  But overall I think the majority of his issues are from volume overload.  This chart was dictated using voice recognition software.  Despite best efforts to proofread,  errors can occur which can change the documentation meaning.      Final diagnoses:  Acute right-sided congestive heart failure [facility])    ED Discharge Orders     None          Ruthe Cornet, DO 08/18/23 1700

## 2023-08-18 NOTE — H&P (Signed)
 History and Physical    [name] [name] FMW:[id number] DOB: June 25, 1955 DOA: 08/18/2023  PCP: [name] HERO, PA-C   Patient coming from: Home   Chief Complaint:  Chief Complaint  Patient presents with   Hypertension   Leg Swelling   Wound Check   ED TRIAGE note:    [facility] with [facility] of hallucinations since yesterday. Pt has had uti in the past that caused these same symptoms however before he was not aware that it was a hallucination like he his this time. Pt endorses difficulty urinating denies dysuria.     Pt reports he is also concerned about a pressure ulcer. Pt son reports he has had it about 1.5 weeks and has been in contact with his primary who recommended keeping area clean, son reports this is not ideal for his incontinence and difficulty showering at his current residence has not been evaluated in about 1.5 weeks.    Pt also endorses  bilateral leg swelling. Known history of heart failure.  Pt endorses he recently used a lot of salt which he thinks has contributed to the swelling. Son reports about 1.5 weeks he has noticed the swelling.    Pt is postoperative retina surgery and right eye is bright red. Pt declines any issues with this.       HPI:  [name] [name] is a 68 y.o. male with medical history significant of paroxysmal A-fib, chronic bilateral pleural effusion, chronic diastolic heart failure with preserved EF 60 to 65%, essential hypertension, CAD status post CABG, peripheral artery disease status post balloon angioplasty 2022, hyperlipidemia, DM type II and chronically elevated ALP who has been presented to [facility] department multiple complaint include hallucination concern for any setting of UTI, pressure ulcer, bilateral leg swelling due to excessive salt intake.  Patient's son reported he has bilateral lower extremity swelling for 1.5 weeks. Son also reported patient has postoperative retinal surgery in right eye redness and  brightness and patient probably taking some prednisone on oxacillin eyedrop which is not on home medication list and they are asking to restart dose as well.  Further workup in the ED showed patient is volume overloaded in the setting of CHF and patient has been transferred to [facility] for further management.  During my evaluation at the bedside patient is alert oriented x 3.  He is answering all the questions appropriately.  Denies any visual and auditory hallucination.  Patient denies any orthopnea, dyspnea, chest pain, and palpitation.  No other complaint at this time.  ED Course:  At presentation to ED patient found hypertensive blood pressure upper 190s/90s range otherwise hemodynamically stable.  Elevated troponin 52 and pending second troponin level.  Elevated proBNP 3796. CBC unremarkable normal WBC count, stable H&H and normal platelet count. CMP unremarkable except chronically elevated BUN 28 and ALP 69.  Stable renal function. UA no evidence of UTI.   EKG showing atrial fibrillation heart rate 52.  Chest x-ray showing stable pulmonary vascular congestion, bibasilar consolidation and bilateral pleural effusion.  In the ED patient treated with Lasix  40 mg and hydralazine  10 mg.  Hospitalist has been consulted for further evaluation of acute on chronic CHF exacerbation, altered mental status in the setting of CHF, bilateral pleural effusion and also patient has sacral ulcer need wound care consult.  Significant labs in the ED: Lab Orders         Urinalysis, Routine w reflex microscopic -Urine, Clean Catch  Comprehensive metabolic panel         CBC with Differential         Pro Brain natriuretic peptide         Comprehensive metabolic panel         CBC       Review of Systems:  Review of Systems  Constitutional:  Negative for chills, fever, malaise/fatigue and weight loss.  HENT:  Negative for hearing loss.   Eyes:  Positive for redness. Negative for  blurred vision, double vision, photophobia, pain and discharge.  Respiratory:  Negative for cough and shortness of breath.   Cardiovascular:  Negative for chest pain, palpitations, orthopnea and PND.  Gastrointestinal:  Negative for heartburn and nausea.  Neurological:  Negative for dizziness and headaches.  Psychiatric/Behavioral:  Negative for depression. The patient is not nervous/anxious and does not have insomnia.     Past Medical History:  Diagnosis Date   Anxiety    Arthritis    Cataract    CHF (congestive heart failure) (HCC)    Constipation    pt states goes EOD- hard stools    Coronary artery disease    Depression    Diabetes mellitus without complication (HCC)    Diabetic retinopathy (HCC)    DKA (diabetic ketoacidoses)    Hyperlipidemia    Hypertension    off meds   Hypertensive retinopathy    Lesion of ulnar nerve, left upper limb 07/12/2017   Non-intractable vomiting without nausea 06/10/2018   Postherpetic neuralgia 08/11/2021   Speculative diagnosis   Psoriasis    Tubular adenoma of colon 03/2019    Past Surgical History:  Procedure Laterality Date   ABDOMINAL AORTOGRAM W/LOWER EXTREMITY N/A 08/30/2020   Procedure: ABDOMINAL AORTOGRAM W/LOWER EXTREMITY;  Surgeon: Serene Gaile ORN, MD;  Location: [facility] INVASIVE CV LAB;  Service: Cardiovascular;  Laterality: N/A;   ABDOMINAL AORTOGRAM W/LOWER EXTREMITY N/A 11/15/2020   Procedure: ABDOMINAL AORTOGRAM W/LOWER EXTREMITY;  Surgeon: Serene Gaile ORN, MD;  Location: [facility] INVASIVE CV [facility];  Service: Cardiovascular;  Laterality: N/A;   ANKLE SURGERY     right   CARDIAC CATHETERIZATION  09/25/2001   COLONOSCOPY     ~10 yr ago- normal  per pt    CORONARY ARTERY BYPASS GRAFT  09/30/2001   CABG x 4   IR THORACENTESIS ASP PLEURAL SPACE W/IMG GUIDE  05/19/2020   IR THORACENTESIS ASP PLEURAL SPACE W/IMG GUIDE  02/04/2023   LAPAROSCOPIC INGUINAL HERNIA REPAIR Bilateral 02/09/2010   MASS EXCISION Left 03/12/2013   Procedure: LEFT  INDEX EXCISION MASS AND DEBRIDMENT DISTAL INTERPHALANGEAL JOINT;  Surgeon: Franky JONELLE Curia, MD;  Location: [facility];  Service: Orthopedics;  Laterality: Left;   PERIPHERAL VASCULAR BALLOON ANGIOPLASTY Left 08/30/2020   Procedure: PERIPHERAL VASCULAR BALLOON ANGIOPLASTY;  Surgeon: Serene Gaile ORN, MD;  Location: [facility];  Service: Cardiovascular;  Laterality: Left;  Peroneal   TRANSESOPHAGEAL ECHOCARDIOGRAM (CATH LAB) N/A 11/26/2022   Procedure: TRANSESOPHAGEAL ECHOCARDIOGRAM;  Surgeon: Kate Lonni CROME, MD;  Location: [facility] INVASIVE CV LAB;  Service: Cardiovascular;  Laterality: N/A;     reports that he has been smoking cigars. He has never used smokeless tobacco. He reports current alcohol use. He reports that he does not use drugs.  Allergies  Allergen Reactions   Wool Alcohol [Lanolin] Hives and Itching    Wool fabric    Family History  Problem Relation Age of Onset   Lymphoma Father    Cancer Paternal Grandmother  Colon Cancer   Colon cancer Paternal Grandmother    Colon polyps Neg Hx    Esophageal cancer Neg Hx    Rectal cancer Neg Hx    Stomach cancer Neg Hx    Diabetes Mellitus I Neg Hx    Pancreatic cancer Neg Hx     Prior to Admission medications   Medication Sig Start Date End Date Taking? Authorizing Provider  acetaminophen  (TYLENOL ) 500 MG tablet Take 500 mg by mouth as needed for mild pain or moderate pain.    [provider]  amLODipine  (NORVASC ) 2.5 MG tablet TAKE 1 TABLET BY MOUTH EVERY DAY 06/27/23   [name], [name], PA-C  atorvastatin  (LIPITOR) 40 MG tablet TAKE 1 TABLET BY MOUTH EVERY DAY 03/21/23   [name], [name], PA-C  brimonidine (ALPHAGAN) 0.2 % ophthalmic solution 1 drop 2 (two) times daily. 03/15/23   [provider]  Cholecalciferol  25 MCG (1000 UT) capsule Take 1,000 Units by mouth daily.    [provider]  clobetasol  (TEMOVATE ) 0.05 % external solution Apply 1 Application topically 2  (two) times daily. For scalp psoriasis. 07/29/23   [name], Mardy HERO, PA-C  Continuous Glucose Transmitter (DEXCOM G6 TRANSMITTER) MISC USE AS INSTRUCTED TO CHECK BLOOD SUGAR. 07/15/23   Shamleffer, Donell Cardinal, MD  ELIQUIS  5 MG TABS tablet TAKE 1 TABLET BY MOUTH TWICE A DAY 04/19/23   O'Neal, Darryle Ned, MD  furosemide  (LASIX ) 20 MG tablet TAKE 1 TABLET BY MOUTH EVERY DAY 04/08/23   [name], [name], PA-C  glipiZIDE  (GLUCOTROL ) 5 MG tablet Take 1 tablet (5 mg total) by mouth 2 (two) times daily before a meal. 08/08/23   Shamleffer, Donell Cardinal, MD  Glucagon  (GVOKE HYPOPEN  2-PACK) 1 MG/0.2ML SOAJ Inject pen into upper thigh and hold it there for five seconds in the event of severe hypoglycemia. 07/29/23   [name], [name], PA-C  ketoconazole  (NIZORAL ) 2 % shampoo Apply 1 Application topically 2 (two) times a week. 07/29/23   [name], [name], PA-C  loperamide  (IMODIUM ) 2 MG capsule Take 1 capsule (2 mg total) by mouth 4 (four) times daily as needed for diarrhea or loose stools. 01/01/23   Wendolyn Jenkins Jansky, MD  loratadine  (CLARITIN ) 10 MG tablet TAKE 1 TABLET BY MOUTH EVERY DAY 05/30/23   [name], [name], PA-C  mirtazapine  (REMERON ) 15 MG tablet TAKE 1 TABLET BY MOUTH EVERYDAY AT BEDTIME 04/23/23   [name], [name], PA-C  Multiple Vitamins-Minerals (MULTIVITAMIN WITH MINERALS) tablet Take 1 tablet by mouth daily.    [provider]  Na Sulfate-K Sulfate-Mg Sulfate concentrate (SUPREP) 17.5-3.13-1.6 GM/177ML SOLN SMARTSIG:1 Kit(s) By Mouth Once 06/06/23   [provider]  PARoxetine  (PAXIL ) 20 MG tablet TAKE 1 TABLET ONCE A DAY FOR DEPRESSION 05/10/23   [name], [name], PA-C  QUEtiapine  (SEROQUEL ) 25 MG tablet GIVE 1 TABLET BY MOUTH AT BEDTIME FOR DELIRIUM 06/18/23   [name], [name], PA-C  sitaGLIPtin  (JANUVIA ) 100 MG tablet Take 1 tablet (100 mg total) by mouth daily. 08/08/23   Shamleffer, Ibtehal Jaralla, MD  spironolactone  (ALDACTONE ) 25 MG tablet Take 1 tablet (25 mg  total) by mouth daily. 03/15/23 07/26/23  Barbaraann Darryle Ned, MD     Physical Exam: Vitals:   08/18/23 1725 08/18/23 1800 08/18/23 1857 08/18/23 1944  BP: (!) 178/89 (!) 186/84 (!) 191/99 (!) 171/87  Pulse:  (!) 59 65 63  Resp:  18 19 (!) 25  Temp:   98.6 F (37 C) 98.2 F (36.8 C)  TempSrc:   Oral Oral  SpO2:  96% 95% 97%  Weight:   73.7 kg   Height:   6' 2 (1.88 m)     Physical Exam Vitals and nursing note reviewed.  Constitutional:      General: He is not in acute distress.    Appearance: Normal appearance. He is not ill-appearing.  HENT:     Mouth/Throat:     Mouth: Mucous membranes are moist.  Cardiovascular:     Rate and Rhythm: Normal rate and regular rhythm.     Pulses: Normal pulses.     Heart sounds: Normal heart sounds.  Pulmonary:     Effort: Pulmonary effort is normal.     Breath sounds: No wheezing.  Musculoskeletal:     Cervical back: Neck supple.     Right lower leg: Edema present.     Left lower leg: Edema present.  Skin:    General: Skin is warm.     Capillary Refill: Capillary refill takes less than 2 seconds.  Neurological:     Mental Status: He is alert and oriented to person, place, and time.  Psychiatric:        Mood and Affect: Mood normal.        Thought Content: Thought content normal.      Labs on Admission: I have personally reviewed following labs and imaging studies  CBC: Recent Labs  Lab 08/18/23 1610  WBC 5.5  NEUTROABS 3.9  HGB 9.8*  HCT 30.9*  MCV 87.0  PLT 214   Basic Metabolic Panel: Recent Labs  Lab 08/18/23 1610  NA 140  K 3.7  CL 102  CO2 25  GLUCOSE 194*  BUN 28*  CREATININE 0.96  CALCIUM  8.9   GFR: Estimated Creatinine Clearance: 74.6 mL/min (by C-G formula based on SCr of 0.96 mg/dL). Liver Function Tests: Recent Labs  Lab 08/18/23 1610  AST 21  ALT 12  ALKPHOS 169*  BILITOT 0.8  PROT 7.1  ALBUMIN 3.4*   No results for input(s): LIPASE, AMYLASE in the last 168 hours. No results for  input(s): AMMONIA in the last 168 hours. Coagulation Profile: No results for input(s): INR, PROTIME in the last 168 hours. Cardiac Enzymes: No results for input(s): CKTOTAL, CKMB, CKMBINDEX, TROPONINI, TROPONINIHS in the last 168 hours. BNP (last 3 results) Recent Labs    11/20/22 1049 02/01/23 1625 02/06/23 0255  BNP 262.5* 457.8* 97.3   HbA1C: No results for input(s): HGBA1C in the last 72 hours. CBG: No results for input(s): GLUCAP in the last 168 hours. Lipid Profile: No results for input(s): CHOL, HDL, LDLCALC, TRIG, CHOLHDL, LDLDIRECT in the last 72 hours. Thyroid  Function Tests: No results for input(s): TSH, T4TOTAL, FREET4, T3FREE, THYROIDAB in the last 72 hours. Anemia Panel: No results for input(s): VITAMINB12, FOLATE, FERRITIN, TIBC, IRON , RETICCTPCT in the last 72 hours. Urine analysis:    Component Value Date/Time   COLORURINE YELLOW 08/18/2023 1752   APPEARANCEUR CLEAR 08/18/2023 1752   LABSPEC 1.010 08/18/2023 1752   PHURINE 6.5 08/18/2023 1752   GLUCOSEU NEGATIVE 08/18/2023 1752   GLUCOSEU >=1000 (A) 03/16/2020 0958   HGBUR SMALL (A) 08/18/2023 1752   BILIRUBINUR NEGATIVE 08/18/2023 1752   BILIRUBINUR Negative 12/31/2022 1222   KETONESUR NEGATIVE 08/18/2023 1752   PROTEINUR 100 (A) 08/18/2023 1752   UROBILINOGEN 1.0 12/31/2022 1222   UROBILINOGEN 0.2 03/16/2020 0958   NITRITE NEGATIVE 08/18/2023 1752   LEUKOCYTESUR NEGATIVE 08/18/2023 1752    Radiological Exams on Admission: I have  personally reviewed images DG Chest Portable 1 View Result Date: 08/18/2023 CLINICAL DATA:  Short of breath, CHF EXAM: PORTABLE CHEST 1 VIEW COMPARISON:  03/01/2023 FINDINGS: Single frontal view of the chest demonstrates postsurgical changes from CABG. Cardiac silhouette is stable. Persistent bibasilar consolidation and effusions, right greater than left, not appreciably changed since prior exam. Central pulmonary vascular  congestion unchanged. No pneumothorax. IMPRESSION: 1. Stable pulmonary vascular congestion, bibasilar consolidation, and bilateral pleural effusions. Electronically Signed   By: Ozell Daring M.D.   On: 08/18/2023 16:21     EKG: My personal interpretation of EKG shows: Atrial fibrillation rate controlled to 52.    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, with long-term current use of insulin  (HCC)   Sacral decubitus ulcer   History of CAD (coronary artery disease)   Bilateral pleural effusion   GAD (generalized anxiety disorder)    Assessment and Plan: Acute on chronic diastolic heart failure Chronic bilateral pleural effusion Hypertensive urgency-in the setting of CHF Essential hypertension -Presented emergency department complaining of bilateral lower extremity swelling and patient son reported he is taking excessive salt in his diet.At presentation to ED patient found hypertensive blood pressure upper 190s/90s range otherwise hemodynamically stable. Elevated troponin 52 and pending second troponin level.  Elevated proBNP 3796. CBC unremarkable normal WBC count, stable H&H and normal platelet count. CMP unremarkable except chronically elevated BUN 28 and ALP 69.  Stable renal function.UA no evidence of UTI.   EKG showing atrial fibrillation heart rate 52.  Chest x-ray showing stable pulmonary vascular congestion, bibasilar consolidation and bilateral pleural effusion. In the ED patient treated with Lasix  40 mg and hydralazine  10 mg. -Given patient is afebrile, no leukocytosis and does not have any cough there is no suspicion for pneumonia at this time - Will obtain CT chest  without contrast for better clarification of pleural effusion and based on the CT finding we will plan for thoracentesis. -After receiving the Lasix  and hydralazine  in the ED patient blood pressure is improving gradually. - Continue IV Lasix  40 mg twice daily - Continue home medication include amlodipine  and spironolactone . -Continue IV hydralazine  as needed. -Obtaining echocardiogram and monitor urine output. - Strict I's/O, daily weight and monitor urine output.  Elevated troponin-secondary to demand ischemia -Elevated troponin in the context of demand ischemia.  EKG showing paroxysmal atrial fibrillation rate controlled.  At this time there is no concern for ACS.  Altered mental status/confusion and hallucination-resolved -Patient's son reported he is having hallucination for a while and concern for UTI however UA no evidence of UTI.  Also while in the ED patient hallucination has been resolved.  Concern for altered mental status in the setting of CHF exacerbation. -Continue delirium precaution and monitor mental status.  History of CAD status post CABG -Continue Eliquis  and Lipitor  History of PAD status post angioplasty -Continue Eliquis  Lipitor  Sacral decubitus ulcer Continue skin care with foam dressing.  Consulting wound care for further management.  Diabetic retinopathy s/p right-sided retinal surgery 2 to 4 days ago Continue as per family request continue ofloxacin  and prednisone eyedrop 4 times daily.   Non-insulin -dependent DM type II -Continue glipizide  and linagliptin .  Continue carb modified diet.  Hyperlipidemia -Continue Lipitor  Generalized anxiety disorder -Continue primidone, Paxil  and Seroquel .  DVT prophylaxis:  Eliquis  Code Status:  Full Code Diet: Heart healthy carb modified diet Disposition Plan: Need to follow-up with CT chest result for pleural effusion. Consults:   Wound care consult Admission status:   Inpatient, Telemetry bed  Severity of  Illness: The appropriate patient status for this patient is INPATIENT. Inpatient status is judged to be reasonable and necessary in order to provide the required intensity of service to ensure the patient's safety. The patient's presenting symptoms, physical exam findings, and initial radiographic and laboratory data in the context of their chronic comorbidities is felt to place them at high risk for further clinical deterioration. Furthermore, it is not anticipated that the patient will be medically stable for discharge from the hospital within 2 midnights of admission.   * I certify that at the point of admission it is my clinical judgment that the patient will require inpatient hospital care spanning beyond 2 midnights from the point of admission due to high intensity of service, high risk for further deterioration and high frequency of surveillance required.DEWAINE    Bayyinah Dukeman, MD [facility]  How to contact the [facility] Attending or Consulting provider 7A - 7P or covering provider during after hours 7P -7A, for this patient.  Check the care team in [facility] and look for a) attending/consulting [facility] provider listed and b) the [facility] team listed Log into [email or web address] and use [place]'s universal password to access. If you do not have the password, please contact the hospital operator. Locate the [facility] provider you are looking for under [facility] and page to a number that you can be directly reached. If you still have difficulty reaching the provider, please page the [facility] (Director on Call) for the Hospitalists listed on amion for assistance.  08/18/2023, 9:18 PM

## 2023-08-18 NOTE — ED Provider Notes (Signed)
 [facility]  Note:  This document was prepared using Dragon voice recognition software and may include unintentional dictation errors.  MRN: [id number] DOB: 09/12/1955  Subjective:   [name] [name] is a 68 y.o. male presenting for visual hallucinations since yesterday, painful pressure ulcer to coccyx, and bilateral leg swelling with history of CHF.  Patient began having hallucinations yesterday which prompted the son to believe that potentially symptoms were secondary to urinary infection which has happened in the past.  Son reports that in past instances hallucinations have occurred but patient was unaware that he was hallucinating.  Patient's son reports that this time he knows that the symptoms he is experiencing are hallucinations but denies any urinary symptoms.  Patient has also been dealing with a pressure ulcer to his buttock for about a week and a half has been in contact with primary care provider who ordered follow-up with wound care as well as gave advice for keeping wound clean.  Patient is incontinent of bowel and bladder and often lays in bed which contributes to increased pressure ulcer exacerbation.  Son reports that his father only bathes or showers approximately 1-2 times per week which also concerns him for possible infection to the wound.  Patient also has been experiencing swelling to bilateral legs which son thinks may be due to increased sodium intake and exacerbation of heart failure.  No current facility-administered medications for this encounter.  Current Outpatient Medications:    amLODipine  (NORVASC ) 2.5 MG tablet, TAKE 1 TABLET BY MOUTH EVERY DAY, Disp: 90 tablet, Rfl: 0   atorvastatin  (LIPITOR) 40 MG tablet, TAKE 1 TABLET BY MOUTH EVERY DAY, Disp: 90 tablet, Rfl: 1   ELIQUIS  5 MG TABS tablet, TAKE 1 TABLET BY MOUTH TWICE A DAY, Disp: 180 tablet, Rfl: 1   furosemide  (LASIX ) 20 MG tablet, TAKE 1 TABLET BY MOUTH EVERY DAY, Disp: 90 tablet,  Rfl: 1   glipiZIDE  (GLUCOTROL ) 5 MG tablet, Take 1 tablet (5 mg total) by mouth 2 (two) times daily before a meal., Disp: 90 tablet, Rfl: 3   mirtazapine  (REMERON ) 15 MG tablet, TAKE 1 TABLET BY MOUTH EVERYDAY AT BEDTIME, Disp: 90 tablet, Rfl: 1   PARoxetine  (PAXIL ) 20 MG tablet, TAKE 1 TABLET ONCE A DAY FOR DEPRESSION, Disp: 90 tablet, Rfl: 1   sitaGLIPtin  (JANUVIA ) 100 MG tablet, Take 1 tablet (100 mg total) by mouth daily., Disp: 90 tablet, Rfl: 3   acetaminophen  (TYLENOL ) 500 MG tablet, Take 500 mg by mouth as needed for mild pain or moderate pain., Disp: , Rfl:    brimonidine (ALPHAGAN) 0.2 % ophthalmic solution, 1 drop 2 (two) times daily., Disp: , Rfl:    Cholecalciferol  25 MCG (1000 UT) capsule, Take 1,000 Units by mouth daily., Disp: , Rfl:    clobetasol  (TEMOVATE ) 0.05 % external solution, Apply 1 Application topically 2 (two) times daily. For scalp psoriasis., Disp: 50 mL, Rfl: 0   Continuous Glucose Transmitter (DEXCOM G6 TRANSMITTER) MISC, USE AS INSTRUCTED TO CHECK BLOOD SUGAR., Disp: 1 each, Rfl: 1   Glucagon  (GVOKE HYPOPEN  2-PACK) 1 MG/0.2ML SOAJ, Inject pen into upper thigh and hold it there for five seconds in the event of severe hypoglycemia., Disp: 0.4 mL, Rfl: 2   ketoconazole  (NIZORAL ) 2 % shampoo, Apply 1 Application topically 2 (two) times a week., Disp: 120 mL, Rfl: 0   loperamide  (IMODIUM ) 2 MG capsule, Take 1 capsule (2 mg total) by mouth 4 (four) times daily as needed for diarrhea or loose stools.,  Disp: 30 capsule, Rfl: 1   loratadine  (CLARITIN ) 10 MG tablet, TAKE 1 TABLET BY MOUTH EVERY DAY, Disp: 90 tablet, Rfl: 1   Multiple Vitamins-Minerals (MULTIVITAMIN WITH MINERALS) tablet, Take 1 tablet by mouth daily., Disp: , Rfl:    Na Sulfate-K Sulfate-Mg Sulfate concentrate (SUPREP) 17.5-3.13-1.6 GM/177ML SOLN, SMARTSIG:1 Kit(s) By Mouth Once, Disp: , Rfl:    QUEtiapine  (SEROQUEL ) 25 MG tablet, GIVE 1 TABLET BY MOUTH AT BEDTIME FOR DELIRIUM, Disp: 90 tablet, Rfl: 0    spironolactone  (ALDACTONE ) 25 MG tablet, Take 1 tablet (25 mg total) by mouth daily., Disp: 90 tablet, Rfl: 3   Allergies  Allergen Reactions   Wool Alcohol [Lanolin] Hives and Itching    Wool fabric    Past Medical History:  Diagnosis Date   Anxiety    Arthritis    Cataract    CHF (congestive heart failure) (HCC)    Constipation    pt states goes EOD- hard stools    Coronary artery disease    Depression    Diabetes mellitus without complication (HCC)    Diabetic retinopathy (HCC)    DKA (diabetic ketoacidoses)    Hyperlipidemia    Hypertension    off meds   Hypertensive retinopathy    Lesion of ulnar nerve, left upper limb 07/12/2017   Non-intractable vomiting without nausea 06/10/2018   Postherpetic neuralgia 08/11/2021   Speculative diagnosis   Psoriasis    Tubular adenoma of colon 03/2019     Past Surgical History:  Procedure Laterality Date   ABDOMINAL AORTOGRAM W/LOWER EXTREMITY N/A 08/30/2020   Procedure: ABDOMINAL AORTOGRAM W/LOWER EXTREMITY;  Surgeon: Serene Gaile ORN, MD;  Location: [facility];  Service: Cardiovascular;  Laterality: N/A;   ABDOMINAL AORTOGRAM W/LOWER EXTREMITY N/A 11/15/2020   Procedure: ABDOMINAL AORTOGRAM W/LOWER EXTREMITY;  Surgeon: Serene Gaile ORN, MD;  Location: [facility];  Service: Cardiovascular;  Laterality: N/A;   ANKLE SURGERY     right   CARDIAC CATHETERIZATION  09/25/2001   COLONOSCOPY     ~10 yr ago- normal  per pt    CORONARY ARTERY BYPASS GRAFT  09/30/2001   CABG x 4   IR THORACENTESIS ASP PLEURAL SPACE W/IMG GUIDE  05/19/2020   IR THORACENTESIS ASP PLEURAL SPACE W/IMG GUIDE  02/04/2023   LAPAROSCOPIC INGUINAL HERNIA REPAIR Bilateral 02/09/2010   MASS EXCISION Left 03/12/2013   Procedure: LEFT INDEX EXCISION MASS AND DEBRIDMENT DISTAL INTERPHALANGEAL JOINT;  Surgeon: Franky JONELLE Curia, MD;  Location: [facility];  Service: Orthopedics;  Laterality: Left;   PERIPHERAL VASCULAR BALLOON ANGIOPLASTY Left  08/30/2020   Procedure: PERIPHERAL VASCULAR BALLOON ANGIOPLASTY;  Surgeon: Serene Gaile ORN, MD;  Location: [facility];  Service: Cardiovascular;  Laterality: Left;  Peroneal   TRANSESOPHAGEAL ECHOCARDIOGRAM (CATH LAB) N/A 11/26/2022   Procedure: TRANSESOPHAGEAL ECHOCARDIOGRAM;  Surgeon: Kate Lonni CROME, MD;  Location: [facility];  Service: Cardiovascular;  Laterality: N/A;    Family History  Problem Relation Age of Onset   Lymphoma Father    Cancer Paternal Grandmother        Colon Cancer   Colon cancer Paternal Grandmother    Colon polyps Neg Hx    Esophageal cancer Neg Hx    Rectal cancer Neg Hx    Stomach cancer Neg Hx    Diabetes Mellitus I Neg Hx    Pancreatic cancer Neg Hx     Social History   Tobacco Use   Smoking status: Some Days  Types: Cigars    Last attempt to quit: 2022    Years since quitting: 3.6   Smokeless tobacco: Never   Tobacco comments:    1-2 per week cigars  05/09/2023  Vaping Use   Vaping status: Never Used  Substance Use Topics   Alcohol use: Yes    Comment: about once a year   Drug use: No    ROS Refer to HPI for ROS details.  Objective:    Vitals: BP (!) 199/92 (BP Location: Right Arm)   Pulse (!) 57   Temp 98.6 F (37 C)   Resp 16   SpO2 93%   Physical Exam Vitals and nursing note reviewed.  Constitutional:      General: He is not in acute distress.    Appearance: Normal appearance. He is well-developed. He is not ill-appearing, toxic-appearing or diaphoretic.  HENT:     Head: Normocephalic.  Eyes:     General: Lids are normal. Vision grossly intact.     Extraocular Movements: Extraocular movements intact.     Right eye: Normal extraocular motion.     Left eye: Normal extraocular motion.     Conjunctiva/sclera:     Right eye: Right conjunctiva is injected. Hemorrhage present. No exudate.    Left eye: Left conjunctiva is not injected. No exudate or hemorrhage. Cardiovascular:     Rate and Rhythm:  Bradycardia present.  Pulmonary:     Effort: Pulmonary effort is normal. No respiratory distress.     Breath sounds: No stridor. No wheezing.  Chest:     Chest wall: No tenderness.  Skin:    General: Skin is warm and dry.     Findings: Erythema and wound (Grade 1 pressure ulcer to buttocks, no significant surrounding erythema, no purulent drainage noted on exam) present.  Neurological:     General: No focal deficit present.     Mental Status: He is alert and oriented to person, place, and time. Mental status is at baseline.  Psychiatric:        Mood and Affect: Mood normal.        Behavior: Behavior normal.     Procedures  No results found for this or any previous visit (from the past 24 hours).  Assessment and Plan :     Discharge Instructions       1. Subacute confusional state (Primary) - POCT URINE DIPSTICK could not be completed in UC due to patient being unable to provide sample. - Increase suspicion that urinary infection/urosepsis may be contributing to confusional state and visual hallucinations.  2. Visual hallucination 3. Bilateral swelling of feet and ankles - Moderate concern for CHF exacerbation based on bilateral lower extremity swelling and elevated blood pressure reading of 199/92. - Patient is very likely fluid overloaded and may need further evaluation than is available in urgent care setting. -Based on current symptoms and past medical history recommend evaluation in emergency department for possible sepsis, CHF exacerbation, hypertension, and altered mental status. - Please go to emergency department after leaving urgent care for further evaluation and treatment.      Devonda Pequignot B Aylani Spurlock   Sevastian Witczak, [place] B, [place] 08/18/23 1409

## 2023-08-18 NOTE — Discharge Instructions (Addendum)
  1. Subacute confusional state (Primary) - POCT URINE DIPSTICK could not be completed in UC due to patient being unable to provide sample. - Increase suspicion that urinary infection/urosepsis may be contributing to confusional state and visual hallucinations.  2. Visual hallucination 3. Bilateral swelling of feet and ankles - Moderate concern for CHF exacerbation based on bilateral lower extremity swelling and elevated blood pressure reading of 199/92. - Patient is very likely fluid overloaded and may need further evaluation than is available in urgent care setting. -Based on current symptoms and past medical history recommend evaluation in emergency department for possible sepsis, CHF exacerbation, hypertension, and altered mental status. - Please go to emergency department after leaving urgent care for further evaluation and treatment.

## 2023-08-18 NOTE — ED Notes (Signed)
 Patient is being discharged from the Urgent Care and sent to the Emergency Department via pov with son . Per reddick NP, patient is in need of higher level of care due to concern for heart failure exacerbation, acute confusion and poor wound healing.. Patient is aware and verbalizes understanding of plan of care.  Vitals:   08/18/23 1323 08/18/23 1325  BP: (!) 202/118 (!) 199/92  Pulse: (!) 57   Resp: 16   Temp: 98.6 F (37 C)   SpO2: 93%

## 2023-08-18 NOTE — ED Notes (Signed)
 Called [facility] to transport the patient to [facility] rm# [id number]

## 2023-08-19 ENCOUNTER — Encounter (HOSPITAL_COMMUNITY): Payer: Self-pay | Admitting: Internal Medicine

## 2023-08-19 ENCOUNTER — Inpatient Hospital Stay (HOSPITAL_COMMUNITY)

## 2023-08-19 DIAGNOSIS — I5033 Acute on chronic diastolic (congestive) heart failure: Secondary | ICD-10-CM

## 2023-08-19 LAB — ECHOCARDIOGRAM COMPLETE
Area-P 1/2: 2.69 cm2
Height: 74 in
S' Lateral: 3.8 cm
Weight: 2567.92 [oz_av]

## 2023-08-19 LAB — CBC
HCT: 25.6 % — ABNORMAL LOW (ref 39.0–52.0)
Hemoglobin: 8.2 g/dL — ABNORMAL LOW (ref 13.0–17.0)
MCH: 27.7 pg (ref 26.0–34.0)
MCHC: 32 g/dL (ref 30.0–36.0)
MCV: 86.5 fL (ref 80.0–100.0)
Platelets: 173 K/uL (ref 150–400)
RBC: 2.96 MIL/uL — ABNORMAL LOW (ref 4.22–5.81)
RDW: 18.6 % — ABNORMAL HIGH (ref 11.5–15.5)
WBC: 5.9 K/uL (ref 4.0–10.5)
nRBC: 0 % (ref 0.0–0.2)

## 2023-08-19 LAB — FERRITIN: Ferritin: 55 ng/mL (ref 24–336)

## 2023-08-19 LAB — COMPREHENSIVE METABOLIC PANEL WITH GFR
ALT: 12 U/L (ref 0–44)
AST: 14 U/L — ABNORMAL LOW (ref 15–41)
Albumin: 2.2 g/dL — ABNORMAL LOW (ref 3.5–5.0)
Alkaline Phosphatase: 106 U/L (ref 38–126)
Anion gap: 5 (ref 5–15)
BUN: 27 mg/dL — ABNORMAL HIGH (ref 8–23)
CO2: 25 mmol/L (ref 22–32)
Calcium: 7.9 mg/dL — ABNORMAL LOW (ref 8.9–10.3)
Chloride: 105 mmol/L (ref 98–111)
Creatinine, Ser: 1.07 mg/dL (ref 0.61–1.24)
GFR, Estimated: >60 mL/min (ref >60)
Glucose, Bld: 186 mg/dL — ABNORMAL HIGH (ref 70–99)
Potassium: 3.5 mmol/L (ref 3.5–5.1)
Sodium: 135 mmol/L (ref 135–145)
Total Bilirubin: 0.6 mg/dL (ref 0.0–1.2)
Total Protein: 5.7 g/dL — ABNORMAL LOW (ref 6.5–8.1)

## 2023-08-19 LAB — GLUCOSE, CAPILLARY
Glucose-Capillary: 156 mg/dL — ABNORMAL HIGH (ref 70–99)
Glucose-Capillary: 165 mg/dL — ABNORMAL HIGH (ref 70–99)
Glucose-Capillary: 179 mg/dL — ABNORMAL HIGH (ref 70–99)
Glucose-Capillary: 198 mg/dL — ABNORMAL HIGH (ref 70–99)

## 2023-08-19 LAB — MRSA NEXT GEN BY PCR, NASAL: MRSA by PCR Next Gen: DETECTED — AB

## 2023-08-19 LAB — VITAMIN B12: Vitamin B-12: 265 pg/mL (ref 180–914)

## 2023-08-19 LAB — RETICULOCYTES
Immature Retic Fract: 12.9 % (ref 2.3–15.9)
RBC.: 3.15 MIL/uL — ABNORMAL LOW (ref 4.22–5.81)
Retic Count, Absolute: 80 K/uL (ref 19.0–186.0)
Retic Ct Pct: 2.5 % (ref 0.4–3.1)

## 2023-08-19 LAB — IRON AND TIBC
Iron: 27 ug/dL — ABNORMAL LOW (ref 45–182)
Saturation Ratios: 9 % — ABNORMAL LOW (ref 17.9–39.5)
TIBC: 293 ug/dL (ref 250–450)
UIBC: 266 ug/dL

## 2023-08-19 LAB — MAGNESIUM: Magnesium: 1.5 mg/dL — ABNORMAL LOW (ref 1.7–2.4)

## 2023-08-19 LAB — FOLATE: Folate: 15.4 ng/mL (ref >5.9)

## 2023-08-19 MED ORDER — MAGNESIUM SULFATE 4 GM/100ML IV SOLN
4.0000 g | Freq: Once | INTRAVENOUS | Status: AC
  Administered 2023-08-19: 4 g via INTRAVENOUS
  Filled 2023-08-19: qty 100

## 2023-08-19 MED ORDER — INSULIN GLARGINE 100 UNIT/ML ~~LOC~~ SOLN
10.0000 [IU] | Freq: Every day | SUBCUTANEOUS | Status: DC
  Administered 2023-08-19 – 2023-08-22 (×4): 10 [IU] via SUBCUTANEOUS
  Filled 2023-08-19 (×4): qty 0.1

## 2023-08-19 MED ORDER — FUROSEMIDE 10 MG/ML IJ SOLN
60.0000 mg | Freq: Two times a day (BID) | INTRAMUSCULAR | Status: DC
  Administered 2023-08-19 – 2023-08-22 (×6): 60 mg via INTRAVENOUS
  Filled 2023-08-19 (×6): qty 6

## 2023-08-19 MED ORDER — POTASSIUM CHLORIDE CRYS ER 20 MEQ PO TBCR
40.0000 meq | EXTENDED_RELEASE_TABLET | Freq: Once | ORAL | Status: AC
  Administered 2023-08-19: 40 meq via ORAL
  Filled 2023-08-19: qty 2

## 2023-08-19 NOTE — Progress Notes (Signed)
  Echocardiogram 2D Echocardiogram has been performed.  Koleen KANDICE Popper, RDCS 08/19/2023, 10:36 AM

## 2023-08-19 NOTE — Telephone Encounter (Signed)
 Please see pt son reply

## 2023-08-19 NOTE — Progress Notes (Signed)
 Heart Failure Nurse Navigator Progress Note  PCP: [name], Mardy HERO, PA-C PCP-Cardiologist: O'Neal Admission Diagnosis: Acute right-sided congestive Heart failure.  [facility]  Presentation:   [name] [name] presented from Urgent Care with complaints of hallucinations x 1 day, Hx of UTI with same symptoms. Pressure ulcer on buttocks, that he has had for 2 weeks. 2+ bilateral Bilateral leg swelling. Patient is status post op retina surgery to right eye, ( bright red) .BP 202/118, HR 57,  ProBNP 3,796, Troponin 52, EKG showing Atrial Fibrillation. CXR showing stable pulmonary vascular congestion, bibasilar consolidation and bilateral pleural effusion.   Patient was educated on the sign and symptoms of heart failure, daily weights, when to call his doctor or go to the ED. Diet/ fluid restrictions, patient reports to drinking multiple Gatorades per day and drinking over 64 oz daily, as well as most his meals are frozen dinner meals that he microwaves. Continued education on taking his medication as prescribed, reports that his son makes his weekly pill boxes for him. Patient verbalized his understanding of all education. A HF TOC appointment was scheduled on 08/26/2023 @ 10:30 am per Greig Mosses, NP.     ECHO/ LVEF: last 60-65% , new pending   Clinical Course:  Past Medical History:  Diagnosis Date   Anxiety    Arthritis    Cataract    CHF (congestive heart failure) (HCC)    Constipation    pt states goes EOD- hard stools    Coronary artery disease    Depression    Diabetes mellitus without complication (HCC)    Diabetic retinopathy (HCC)    DKA (diabetic ketoacidoses)    Hyperlipidemia    Hypertension    off meds   Hypertensive retinopathy    Lesion of ulnar nerve, left upper limb 07/12/2017   Non-intractable vomiting without nausea 06/10/2018   Postherpetic neuralgia 08/11/2021   Speculative diagnosis   Psoriasis    Tubular adenoma of colon 03/2019      Social History   Socioeconomic History   Marital status: Divorced    Spouse name: Not on file   Number of children: 4   Years of education: 12   Highest education level: 12th grade  Occupational History   Occupation: Retired  Tobacco Use   Smoking status: Some Days    Types: Cigars    Last attempt to quit: 2022    Years since quitting: 3.6   Smokeless tobacco: Never   Tobacco comments:    1-2 per week cigars  05/09/2023  Vaping Use   Vaping status: Never Used  Substance and Sexual Activity   Alcohol use: Yes    Comment: about once a year   Drug use: No   Sexual activity: Yes  Other Topics Concern   Not on file  Social History Narrative   Lives with his adult son [name].   Has 4 children, 2 sons- both live in [place], 2 daughters one lives in [place], [place]  and youngest daughter attends [employer].    Fiance lives in  [place], [place]   Social Drivers of Health   Financial Resource Strain: Low Risk  (11/21/2022)   Overall Financial Resource Strain (CARDIA)    Difficulty of Paying Living Expenses: Not very hard  Food Insecurity: Food Insecurity Present (08/18/2023)   Hunger Vital Sign    Worried About Running Out of Food in the Last Year: Sometimes true    Ran Out of Food in the Last Year: Sometimes  true  Transportation Needs: No Transportation Needs (08/18/2023)   PRAPARE - Administrator, Civil Service (Medical): No    Lack of Transportation (Non-Medical): No  Physical Activity: Inactive (11/21/2022)   Exercise Vital Sign    Days of Exercise per Week: 0 days    Minutes of Exercise per Session: 0 min  Stress: Stress Concern Present (11/21/2022)   [employer] of Occupational Health - Occupational Stress Questionnaire    Feeling of Stress : Very much  Social Connections: Socially Isolated (08/18/2023)   Social Connection and Isolation Panel    Frequency of Communication with Friends and Family: Once a week    Frequency of Social  Gatherings with Friends and Family: Once a week    Attends Religious Services: Never    Database administrator or Organizations: No    Attends Engineer, structural: Never    Marital Status: Divorced   Water engineer and Provision:  Detailed education and instructions provided on heart failure disease management including the following:  Signs and symptoms of Heart Failure When to call the physician Importance of daily weights Low sodium diet Fluid restriction Medication management Anticipated future follow-up appointments  Patient education given on each of the above topics.  Patient acknowledges understanding via teach back method and acceptance of all instructions.  Education Materials:  Living Better With Heart Failure Booklet, HF zone tool, & Daily Weight Tracker Tool.  Patient has scale at home: yes Patient has pill box at home: yes, his son fills it weekly for him.     High Risk Criteria for Readmission and/or Poor Patient Outcomes: Heart failure hospital admissions (last 6 months): 2  No Show rate: 14% Difficult social situation: Lives alone Demonstrates medication adherence: yes Primary Language: English  Literacy level: Reading, writing, and comprehension. (Mild cognitive deficits)   Barriers of Care:   Diet/ fluid restrictions ( salt, frozen meals daily, multiple Gatorades, drinks over 64 oz daily)  Daily weights  Considerations/Referrals:   Referral made to Heart Failure Pharmacist Stewardship: NA Referral made to Heart Failure CSW/NCM TOC: NA Referral made to Heart & Vascular TOC clinic: Yes, 08/26/2023 @ 10:30 am per Greig Mosses, NP   Items for Follow-up on DC/TOC: Continued HF education Diet/ fluid restrictions/ daily weights   Stephane Haddock, BSN, RN Heart Failure Teacher, adult education Only

## 2023-08-19 NOTE — Plan of Care (Signed)
   Problem: Health Behavior/Discharge Planning: Goal: Ability to manage health-related needs will improve Outcome: Progressing   Problem: Clinical Measurements: Goal: Will remain free from infection Outcome: Progressing

## 2023-08-19 NOTE — Inpatient Diabetes Management (Signed)
 Inpatient Diabetes Program Recommendations  AACE/ADA: New Consensus Statement on Inpatient Glycemic Control (2015)  Target Ranges:  Prepandial:   less than 140 mg/dL      Peak postprandial:   less than 180 mg/dL (1-2 hours)      Critically ill patients:  140 - 180 mg/dL   Lab Results  Component Value Date   GLUCAP 179 (H) 08/19/2023   HGBA1C 9.1 (H) 07/26/2023    Review of Glycemic Control  Diabetes history: DM 2, Sees [facility] Outpatient Diabetes medications: Glipizide  5 mg Daily, Januvia  100 mg Daily Current orders for Inpatient glycemic control:  Lantus  10 units Daily Tradjenta  5 mg Daily Glipizide  5 mg bid  Spoke with pt at bedside regarding A1c 9.1% on 7/25 and glucose control at home. Pt last A1c was 7.8% on 02/2023. Last Endocrinology visit pt was having hypoglycemia at home during the night and Dr. Sam reduced glipizide  dose. Per pt report this admission, glucose trends have stabilized out and he does not have as frequent hypoglycemia. Pt reports frequent follow up and close contact with Dr. Sam. Pt to follow up outpatient.  Thanks,  Clotilda Bull RN, MSN, BC-ADM Inpatient Diabetes Coordinator Team Pager [phone] (8a-5p)

## 2023-08-19 NOTE — Progress Notes (Signed)
 PROGRESS NOTE    [name] [name]  FMW:[id number] DOB: 1955/05/26 DOA: 08/18/2023 PCP: [name] HERO, PA-C  Chronically ill 68/M with history of paroxysmal A-fib, chronic bilateral pleural effusion, diastolic CHF, CAD/CABG, PAD with balloon angioplasty, type 2 diabetes mellitus presented to the ED with multiple complaints, family had noticed increased lower extremity edema for a few weeks, in addition he has also had ongoing hallucination off-and-on for several weeks to months.  Recently underwent retinal surgery, has residual redness. - In the ED he was oriented, hypertensive, troponin 52, proBNP 3796, creatinine 1.07, albumin 2.2, EKG with A-fib heart rate in the 50s, chest x-ray with pulmonary vascular congestion, bibasilar consolidation and bilateral pleural effusions   Subjective: -Feels fair overall, has noticed hallucinations recently and worsening swelling  Assessment and Plan:  Acute on chronic diastolic heart failure Pleural effusions moderate, bilateral Hypertensive urgency - Last echo 2/25 with EF 60-65%, LVH, normal RV, moderately elevated PASP, mild to moderate MR  -Has significant hypoalbuminemia contributing to third spacing -Continue IV Lasix , increased dose to 60 mg twice daily, add Aldactone  -Echo ordered by admitting MD will follow-up -increase activity, PT OT eval -Repeat x-ray in 48 hours    Elevated troponin-secondary to demand ischemia - No ACS, monitor   Altered mental status/confusion and hallucination -Family allegedly reported hallucinations off-and-on for a while -Clear at this time, oriented x 3 with mild cognitive deficits -Check TSH, B12 - PT OT eval  Persistent atrial fibrillation Continue Eliquis , heart rate is controlled   History of CAD status post CABG History of PAD, angioplasty -Continue Eliquis  and Lipitor   Sacral decubitus ulcer Continue skin care with foam dressing.  Consulting wound care for further management.   Diabetic  retinopathy s/p right-sided retinal surgery 3- 4 days ago Continue ofloxacin  and prednisone eyedrop 4 times daily.   Non-insulin -dependent DM type II -Continue glipizide  and linagliptin .  Continue carb modified diet. -A1c is 9.1 indicating poor long-term control   Hyperlipidemia -Continue Lipitor   Generalized anxiety disorder -Continue primidone, Paxil  and Seroquel .   DVT prophylaxis:  Eliquis  Code Status:  Full Code Disposition, may need rehab  Consultants:    Procedures:   Antimicrobials:    Objective: Vitals:   08/19/23 0400 08/19/23 0500 08/19/23 0725 08/19/23 0800  BP: (!) 148/69  (!) 108/58 (!) 158/71  Pulse: (!) 55  (!) 50 (!) 52  Resp: 18  18 16   Temp: 98.9 F (37.2 C)  (!) 97.5 F (36.4 C)   TempSrc: Oral  Oral   SpO2: 98%  95% 97%  Weight:  72.8 kg    Height:        Intake/Output Summary (Last 24 hours) at 08/19/2023 0936 Last data filed at 08/19/2023 9170 Gross per 24 hour  Intake 840 ml  Output 2030 ml  Net -1190 ml   Filed Weights   08/18/23 1434 08/18/23 1857 08/19/23 0500  Weight: 72.6 kg 73.7 kg 72.8 kg    Examination:  General exam: Appears calm and comfortable, AAO x 2, mild cognitive deficits HEENT: Positive JVD, right eye with subconjunctival erythema Respiratory system: Few basilar rales Cardiovascular system: S1 & S2 heard, RRR.  Abd: nondistended, soft and nontender.Normal bowel sounds heard. Central nervous system: Alert and oriented. No focal neurological deficits. Extremities: 2+ edema Skin: No rashes Psychiatry:  Mood & affect appropriate.     Data Reviewed:   CBC: Recent Labs  Lab 08/18/23 1610 08/19/23 0346  WBC 5.5 5.9  NEUTROABS 3.9  --  HGB 9.8* 8.2*  HCT 30.9* 25.6*  MCV 87.0 86.5  PLT 214 173   Basic Metabolic Panel: Recent Labs  Lab 08/18/23 1610 08/19/23 0346  NA 140 135  K 3.7 3.5  CL 102 105  CO2 25 25  GLUCOSE 194* 186*  BUN 28* 27*  CREATININE 0.96 1.07  CALCIUM  8.9 7.9*    GFR: Estimated Creatinine Clearance: 66.1 mL/min (by C-G formula based on SCr of 1.07 mg/dL). Liver Function Tests: Recent Labs  Lab 08/18/23 1610 08/19/23 0346  AST 21 14*  ALT 12 12  ALKPHOS 169* 106  BILITOT 0.8 0.6  PROT 7.1 5.7*  ALBUMIN 3.4* 2.2*   No results for input(s): LIPASE, AMYLASE in the last 168 hours. No results for input(s): AMMONIA in the last 168 hours. Coagulation Profile: No results for input(s): INR, PROTIME in the last 168 hours. Cardiac Enzymes: No results for input(s): CKTOTAL, CKMB, CKMBINDEX, TROPONINI in the last 168 hours. BNP (last 3 results) Recent Labs    03/01/23 1157 08/18/23 1610  PROBNP 399.0* 3,796.0*   HbA1C: No results for input(s): HGBA1C in the last 72 hours. CBG: Recent Labs  Lab 08/19/23 0637  GLUCAP 156*   Lipid Profile: No results for input(s): CHOL, HDL, LDLCALC, TRIG, CHOLHDL, LDLDIRECT in the last 72 hours. Thyroid  Function Tests: No results for input(s): TSH, T4TOTAL, FREET4, T3FREE, THYROIDAB in the last 72 hours. Anemia Panel: No results for input(s): VITAMINB12, FOLATE, FERRITIN, TIBC, IRON , RETICCTPCT in the last 72 hours. Urine analysis:    Component Value Date/Time   COLORURINE YELLOW 08/18/2023 1752   APPEARANCEUR CLEAR 08/18/2023 1752   LABSPEC 1.010 08/18/2023 1752   PHURINE 6.5 08/18/2023 1752   GLUCOSEU NEGATIVE 08/18/2023 1752   GLUCOSEU >=1000 (A) 03/16/2020 0958   HGBUR SMALL (A) 08/18/2023 1752   BILIRUBINUR NEGATIVE 08/18/2023 1752   BILIRUBINUR Negative 12/31/2022 1222   KETONESUR NEGATIVE 08/18/2023 1752   PROTEINUR 100 (A) 08/18/2023 1752   UROBILINOGEN 1.0 12/31/2022 1222   UROBILINOGEN 0.2 03/16/2020 0958   NITRITE NEGATIVE 08/18/2023 1752   LEUKOCYTESUR NEGATIVE 08/18/2023 1752   Sepsis Labs: @LABRCNTIP (procalcitonin:4,lacticidven:4)  ) Recent Results (from the past 240 hours)  MRSA Next Gen by PCR, Nasal     Status: Abnormal    Collection Time: 08/18/23 10:38 PM   Specimen: Nasal Mucosa; Nasal Swab  Result Value Ref Range Status   MRSA by PCR Next Gen DETECTED (A) NOT DETECTED Final    Comment: RESULT CALLED TO, READ BACK BY AND VERIFIED WITH: AMIREHSANI RN 08/19/2023 @ 0001 BY AB (NOTE) The GeneXpert MRSA Assay (FDA approved for NASAL specimens only), is one component of a comprehensive MRSA colonization surveillance program. It is not intended to diagnose MRSA infection nor to guide or monitor treatment for MRSA infections. Test performance is not FDA approved in patients less than 76 years old. Performed at [facility], [address], [place], [place] [address]      Radiology Studies: CT CHEST WO CONTRAST Result Date: 08/18/2023 EXAM: CT CHEST WITHOUT CONTRAST 08/18/2023 09:18:51 PM TECHNIQUE: CT of the chest was performed without the administration of intravenous contrast. Multiplanar reformatted images are provided for review. Automated exposure control, iterative reconstruction, and/or weight based adjustment of the mA/kV was utilized to reduce the radiation dose to as low as reasonably achievable. COMPARISON: None available. CLINICAL HISTORY: Bilateral pleural effusion and pneumonia. FINDINGS: MEDIASTINUM AND LYMPH NODES: Postsurgical changes in the mediastinum. Calcific aortic atherosclerosis. Coronary artery calcifications. LUNGS AND PLEURA: Right greater  than left intermediate size pleural effusions. Bibasilar atelectasis. SOFT TISSUES/BONES: No acute abnormality of the bones or soft tissues. UPPER ABDOMEN: Limited images of the upper abdomen demonstrates no acute abnormality. IMPRESSION: 1. Right greater than left intermediate size pleural effusions with associated bibasilar atelectasis. 2. No acute findings of pneumonia. Electronically signed by: Franky Stanford MD 08/18/2023 11:28 PM EDT RP Workstation: [id number]   DG Chest Portable 1 View Result Date: 08/18/2023 CLINICAL DATA:  Short of breath,  CHF EXAM: PORTABLE CHEST 1 VIEW COMPARISON:  03/01/2023 FINDINGS: Single frontal view of the chest demonstrates postsurgical changes from CABG. Cardiac silhouette is stable. Persistent bibasilar consolidation and effusions, right greater than left, not appreciably changed since prior exam. Central pulmonary vascular congestion unchanged. No pneumothorax. IMPRESSION: 1. Stable pulmonary vascular congestion, bibasilar consolidation, and bilateral pleural effusions. Electronically Signed   By: Ozell Daring M.D.   On: 08/18/2023 16:21     Scheduled Meds:  amLODipine   2.5 mg Oral BID   apixaban   5 mg Oral BID   atorvastatin   40 mg Oral Daily   furosemide   40 mg Intravenous BID   glipiZIDE   5 mg Oral BID AC   linagliptin   5 mg Oral Daily   mirtazapine   15 mg Oral QHS   ofloxacin   1 drop Right Eye QID   PARoxetine   20 mg Oral Daily   prednisoLONE  acetate  1 drop Right Eye QID   QUEtiapine   25 mg Oral QHS   sodium chloride  flush  3 mL Intravenous Q12H   spironolactone   25 mg Oral Daily   Continuous Infusions:  sodium chloride        LOS: 1 day    Time spent:    Sigurd Pac, MD [facility]   08/19/2023, 9:36 AM

## 2023-08-19 NOTE — Consult Note (Signed)
 WOC Nurse Consult Note: Reason for Consult: sacral wound, L foot wound  Wound type: 1.  Deep Tissue Pressure Injury sacrum/coccyx that is evolving; purple maroon discoloration evolving to reveal red moist tissue L buttock  2. Left foot full thickness r/t trauma vs edema vs ?  Pressure Injury POA: Yes Measurement: see nursing flowsheet  Wound bed: L foot red hemorrhagic tissue; sacrum/coccyx/L buttock as above  Drainage (amount, consistency, odor) see nursing flowsheet; no purulent drainage per MD note  Periwound:erythema and chronic tissue damage surrounding sacrum/coccyx wound; L foot appears to have rash vs. Venous stasis changes (feet edematous)  Dressing procedure/placement/frequency:  Cleanse sacral/buttocks wound with NS, apply Xeroform gauze Soila [phone]) to wound bed daily and secure with silicone foam.   Cleanse L dorsal foot wound with NS, apply Xeroform Soila [phone]) to entire area daily and secure with silicone foam or Kerlix roll gauze whichever is preferred.   Patient should be placed on a low air loss mattress for pressure redistribution and moisture management (Will also help with discomfort). If patient up to chair use pressure redistribution cushion Gerlean [phone].   POC discussed with bedside nurse. WOC team will not follow.  Re-consult if further needs arise.   Thank you,    Powell Bar MSN, RN-BC, [employer]

## 2023-08-19 NOTE — Telephone Encounter (Signed)
 Please see [name] reply and advise

## 2023-08-20 ENCOUNTER — Encounter: Payer: Self-pay | Admitting: Internal Medicine

## 2023-08-20 DIAGNOSIS — I5033 Acute on chronic diastolic (congestive) heart failure: Secondary | ICD-10-CM | POA: Diagnosis not present

## 2023-08-20 LAB — GLUCOSE, CAPILLARY
Glucose-Capillary: 125 mg/dL — ABNORMAL HIGH (ref 70–99)
Glucose-Capillary: 166 mg/dL — ABNORMAL HIGH (ref 70–99)
Glucose-Capillary: 191 mg/dL — ABNORMAL HIGH (ref 70–99)
Glucose-Capillary: 201 mg/dL — ABNORMAL HIGH (ref 70–99)

## 2023-08-20 MED ORDER — SODIUM CHLORIDE 0.9 % IV SOLN
300.0000 mg | Freq: Once | INTRAVENOUS | Status: AC
  Administered 2023-08-20: 300 mg via INTRAVENOUS
  Filled 2023-08-20: qty 15

## 2023-08-20 MED ORDER — IRON SUCROSE 300 MG IVPB - SIMPLE MED: 300.0000 mg | Freq: Once | Status: DC

## 2023-08-20 MED ORDER — MUPIROCIN 2 % EX OINT
1.0000 | TOPICAL_OINTMENT | Freq: Two times a day (BID) | CUTANEOUS | Status: DC
  Administered 2023-08-20 – 2023-08-22 (×5): 1 via NASAL
  Filled 2023-08-20: qty 22

## 2023-08-20 MED ORDER — CYANOCOBALAMIN 1000 MCG/ML IJ SOLN
1000.0000 ug | Freq: Once | INTRAMUSCULAR | Status: AC
  Administered 2023-08-20: 1000 ug via INTRAMUSCULAR
  Filled 2023-08-20: qty 1

## 2023-08-20 MED ORDER — CHLORHEXIDINE GLUCONATE CLOTH 2 % EX PADS
6.0000 | MEDICATED_PAD | Freq: Every day | CUTANEOUS | Status: DC
  Administered 2023-08-20 – 2023-08-22 (×3): 6 via TOPICAL

## 2023-08-20 MED ORDER — IRON SUCROSE 300 MG IVPB - SIMPLE MED
300.0000 mg | Freq: Once | Status: DC
  Filled 2023-08-20: qty 265

## 2023-08-20 NOTE — Evaluation (Signed)
 Physical Therapy Evaluation Patient Details Name: [name] [name] MRN: [id number] DOB: [birth year] Today's Date: 08/20/2023  History of Present Illness  Pt is a 68 yr old male who presented 08/18/23 due to LE edema, wound HTN, and hallucinations. Pt admitted due to acute on chronic CHF. Pt's chest xray showed bilateral pleural effusion.  PMH: A-fib on Eliquis , CAD s/p CABG, PAD, diastolic CHF, CVA, DM2 on insulin  pump, HTN, HLD, depression, eye sx to R eye   Clinical Impression  Pt in bed upon arrival of PT, agreeable to evaluation at this time. Prior to admission the pt reports he was independent with mobility with use of rollator, living alone with son next door who can assist in morning and evening after work. The pt now presents with limitations in functional mobility, power, dynamic stability, and endurance, and will continue to benefit from skilled PT to address these deficits. The pt required minA to complete bed mobility and to boost to standing from EOB, but was able to ambulate with CGA and use of rollator. Suspect that with continued skilled PT acutely he could progress to modI and return home with intermittent supervision from his son. Will benefit from HHPT to progress LE strength and power, stability, and reduce risk of continued falls.         If plan is discharge home, recommend the following: A little help with walking and/or transfers;A little help with bathing/dressing/bathroom;Assistance with cooking/housework;Direct supervision/assist for medications management;Direct supervision/assist for financial management;Assist for transportation;Help with stairs or ramp for entrance   Can travel by private vehicle        Equipment Recommendations None recommended by PT  Recommendations for Other Services       Functional Status Assessment Patient has had a recent decline in their functional status and demonstrates the ability to make significant improvements in function in a  reasonable and predictable amount of time.     Precautions / Restrictions Precautions Precautions: Fall Recall of Precautions/Restrictions: Intact Restrictions Weight Bearing Restrictions Per Provider Order: No      Mobility  Bed Mobility Overal bed mobility: Needs Assistance Bed Mobility: Supine to Sit, Sit to Supine     Supine to sit: HOB elevated, Used rails, Min assist Sit to supine: Min assist   General bed mobility comments: minA to elevate trunk and minA to put LE back into bed    Transfers Overall transfer level: Needs assistance Equipment used: Rolling walker (2 wheels) Transfers: Sit to/from Stand Sit to Stand: Min assist           General transfer comment: minA to boost to standing, cues for hand placement.    Ambulation/Gait Ambulation/Gait assistance: Contact guard assist, Supervision Gait Distance (Feet): 75 Feet Assistive device: Rollator (4 wheels) Gait Pattern/deviations: Step-through pattern, Decreased stride length, Narrow base of support Gait velocity: decreased Gait velocity interpretation: <1.31 ft/sec, indicative of household ambulator   General Gait Details: slightly flexed stance but stable on rollator. pt reports at baseline gait      Balance Overall balance assessment: Needs assistance Sitting-balance support: Feet supported Sitting balance-Leahy Scale: Good     Standing balance support: Bilateral upper extremity supported Standing balance-Leahy Scale: Fair Standing balance comment: dependent on BUE support                             Pertinent Vitals/Pain Pain Assessment Pain Assessment: No/denies pain    Home Living Family/patient expects to be discharged to:: Private  residence Living Arrangements: Alone Available Help at Discharge: Family;Available PRN/intermittently;Personal care attendant Type of Home: Apartment Home Access: Level entry       Home Layout: One level Home Equipment: Grab bars -  toilet;Grab bars - tub/shower;Rollator (4 wheels);Wheelchair - Lawyer Comments: son lives next door to pt and check on him am/pm and completes medication management    Prior Function Prior Level of Function : Needs assist       Physical Assist : ADLs (physical)   ADLs (physical): Bathing;IADLs Mobility Comments: uses 4WW mostly but then reports occasionly does not use any DME ADLs Comments: Per pt reports does not bathe unless son is there.     Extremity/Trunk Assessment   Upper Extremity Assessment Upper Extremity Assessment: Defer to OT evaluation;Generalized weakness    Lower Extremity Assessment Lower Extremity Assessment: Overall WFL for tasks assessed    Cervical / Trunk Assessment Cervical / Trunk Assessment: Kyphotic  Communication   Communication Communication: No apparent difficulties    Cognition Arousal: Alert Behavior During Therapy: WFL for tasks assessed/performed   PT - Cognitive impairments: No apparent impairments                         Following commands: Intact       Cueing Cueing Techniques: Verbal cues     General Comments General comments (skin integrity, edema, etc.): VSS on RA    Exercises     Assessment/Plan    PT Assessment Patient needs continued PT services  PT Problem List Decreased strength;Decreased activity tolerance;Decreased balance;Decreased mobility       PT Treatment Interventions DME instruction;Gait training;Stair training;Therapeutic exercise;Functional mobility training;Therapeutic activities;Patient/family education    PT Goals (Current goals can be found in the Care Plan section)  Acute Rehab PT Goals Patient Stated Goal: return home PT Goal Formulation: With patient Time For Goal Achievement: 09/03/23 Potential to Achieve Goals: Good    Frequency Min 2X/week        AM-PAC PT 6 Clicks Mobility  Outcome Measure Help needed turning from your back to your side while in  a flat bed without using bedrails?: A Little Help needed moving from lying on your back to sitting on the side of a flat bed without using bedrails?: A Little Help needed moving to and from a bed to a chair (including a wheelchair)?: A Little Help needed standing up from a chair using your arms (e.g., wheelchair or bedside chair)?: A Little Help needed to walk in hospital room?: A Little Help needed climbing 3-5 steps with a railing? : A Lot 6 Click Score: 17    End of Session Equipment Utilized During Treatment: Gait belt Activity Tolerance: Patient tolerated treatment well Patient left: in bed;with call bell/phone within reach;with SCD's reapplied Nurse Communication: Mobility status PT Visit Diagnosis: Unsteadiness on feet (R26.81);Other abnormalities of gait and mobility (R26.89);Muscle weakness (generalized) (M62.81);History of falling (Z91.81)    Time: 8552-8490 PT Time Calculation (min) (ACUTE ONLY): 22 min   Charges:   PT Evaluation $PT Eval Low Complexity: 1 Low   PT General Charges $$ ACUTE PT VISIT: 1 Visit         [name], PT, DPT   Acute Rehabilitation Department Office [phone] Secure Chat Communication Preferred  [name] 08/20/2023, 3:43 PM

## 2023-08-20 NOTE — Evaluation (Signed)
 Occupational Therapy Evaluation Patient Details Name: [name] [name] MRN: [id number] DOB: 04-29-1955 Today's Date: 08/20/2023   History of Present Illness   Pt is a 68 yr old male who presented 08/18/23 due to LE edema, wound HTN, and hallucinations. Pt admitted due to acute on chronic CHF. Pt's chest xray showed bilateral pleural effusion.  PMH: A-fib on Eliquis , CAD s/p CABG, PAD, diastolic CHF, CVA, DM2 on insulin  pump, HTN, HLD, depression, eye sx to R eye     Clinical Impressions Pt reported at PLOF they live next door to their son who comes by every am/pm to assist in meal preparation, medication management and assists with showers. Pt at this time completed UE ADLS with set up and LB with supervision to CGA. Pt was able to complete room level ambulation with supervision to CGA. At this time recommendation for [facility] therapy and to follow with Acute Occupational Therapy.     If plan is discharge home, recommend the following:   A little help with walking and/or transfers;A little help with bathing/dressing/bathroom;Assistance with cooking/housework;Direct supervision/assist for medications management;Direct supervision/assist for financial management;Assist for transportation     Functional Status Assessment   Patient has had a recent decline in their functional status and demonstrates the ability to make significant improvements in function in a reasonable and predictable amount of time.     Equipment Recommendations   None recommended by OT     Recommendations for Other Services         Precautions/Restrictions   Precautions Precautions: Fall Recall of Precautions/Restrictions: Intact Restrictions Weight Bearing Restrictions Per Provider Order: No     Mobility Bed Mobility Overal bed mobility: Needs Assistance Bed Mobility: Supine to Sit     Supine to sit: Supervision          Transfers Overall transfer level: Needs assistance Equipment used:  Rolling walker (2 wheels) Transfers: Sit to/from Stand Sit to Stand: Supervision, Contact guard assist                  Balance Overall balance assessment: Needs assistance Sitting-balance support: Feet supported Sitting balance-Leahy Scale: Good     Standing balance support: Bilateral upper extremity supported Standing balance-Leahy Scale: Fair                             ADL either performed or assessed with clinical judgement   ADL Overall ADL's : Needs assistance/impaired Eating/Feeding: Set up;Sitting   Grooming: Wash/dry face;Set up;Sitting   Upper Body Bathing: Set up;Sitting   Lower Body Bathing: Contact guard assist;Sit to/from stand   Upper Body Dressing : Set up;Sitting   Lower Body Dressing: Contact guard assist;Sit to/from stand   Toilet Transfer: Contact guard assist   Toileting- Clothing Manipulation and Hygiene: Contact guard assist       Functional mobility during ADLs: Contact guard assist;Supervision/safety;Rolling walker (2 wheels)       Vision   Vision Assessment?:  (Pt is blind in L eye and blurry vision in R eye post sx)     Perception         Praxis         Pertinent Vitals/Pain Pain Assessment Pain Assessment: No/denies pain     Extremity/Trunk Assessment Upper Extremity Assessment Upper Extremity Assessment: Generalized weakness (Pt reported having B shoulder sx and noted AROM about to 85 deg shoulder flexion/abduction)   Lower Extremity Assessment Lower Extremity Assessment: Defer to PT evaluation  Cervical / Trunk Assessment Cervical / Trunk Assessment: Kyphotic   Communication Communication Communication: No apparent difficulties   Cognition Arousal: Alert Behavior During Therapy: WFL for tasks assessed/performed Cognition: No apparent impairments                               Following commands: Intact       Cueing  General Comments   Cueing Techniques: Verbal cues       Exercises     Shoulder Instructions      Home Living Family/patient expects to be discharged to:: Private residence Living Arrangements: Alone Available Help at Discharge: Family;Available PRN/intermittently;Personal care attendant Type of Home: Apartment Home Access: Level entry     Home Layout: One level     Bathroom Shower/Tub: Chief Strategy Officer: Standard Bathroom Accessibility: Yes   Home Equipment: Grab bars - toilet;Grab bars - tub/shower;Rollator (4 wheels);Wheelchair - Sport and exercise psychologist Comments: son lives next door to pt and check on him am/pm and completes medication management      Prior Functioning/Environment Prior Level of Function : Needs assist       Physical Assist : ADLs (physical)   ADLs (physical): Bathing;IADLs Mobility Comments: uses 4WW mostly but then reports occasionly does not use any DME ADLs Comments: Per pt reprots does not bath unless son is there.    OT Problem List: Decreased strength;Impaired balance (sitting and/or standing);Decreased safety awareness;Decreased knowledge of use of DME or AE   OT Treatment/Interventions: Self-care/ADL training;Therapeutic exercise;Therapeutic activities;Patient/family education;Balance training      OT Goals(Current goals can be found in the care plan section)   Acute Rehab OT Goals Patient Stated Goal: to go home today OT Goal Formulation: With patient Time For Goal Achievement: 09/03/23 Potential to Achieve Goals: Good   OT Frequency:  Min 2X/week    Co-evaluation              AM-PAC OT 6 Clicks Daily Activity     Outcome Measure Help from another person eating meals?: A Little Help from another person taking care of personal grooming?: A Little Help from another person toileting, which includes using toliet, bedpan, or urinal?: A Little Help from another person bathing (including washing, rinsing, drying)?: A Little Help from another person to put  on and taking off regular upper body clothing?: A Little Help from another person to put on and taking off regular lower body clothing?: A Little 6 Click Score: 18   End of Session Equipment Utilized During Treatment: Gait belt;Rolling walker (2 wheels) Nurse Communication: Mobility status  Activity Tolerance: Patient tolerated treatment well Patient left: in chair;with call bell/phone within reach;with chair alarm set  OT Visit Diagnosis: Unsteadiness on feet (R26.81);Other abnormalities of gait and mobility (R26.89);Repeated falls (R29.6);Muscle weakness (generalized) (M62.81)                Time: 0726-0803 OT Time Calculation (min): 37 min Charges:  OT General Charges $OT Visit: 1 Visit OT Evaluation $OT Eval Low Complexity: 1 Low OT Treatments $Self Care/Home Management : 8-22 mins  Warrick POUR OTR/L  Acute Rehab Services  [phone] office number   Warrick Berber 08/20/2023, 8:14 AM

## 2023-08-20 NOTE — Plan of Care (Signed)
   Problem: Health Behavior/Discharge Planning: Goal: Ability to manage health-related needs will improve Outcome: Progressing

## 2023-08-20 NOTE — Progress Notes (Signed)
 PT Cancellation Note  Patient Details Name: [name] [name] MRN: [id number] DOB: August 01, 1955   Cancelled Treatment:    Reason Eval/Treat Not Completed: Fatigue/lethargy limiting ability to participate pt declined mobility due to fatigue, asked PT to return in a few hours for evaluation.   [name], PT, DPT   Acute Rehabilitation Department Office [phone] Secure Chat Communication Preferred   [name] 08/20/2023, 11:33 AM

## 2023-08-20 NOTE — Progress Notes (Addendum)
 PROGRESS NOTE    [name] [name]  FMW:[id number] DOB: 1955/01/02 DOA: 08/18/2023 PCP: [name] HERO, PA-C  Chronically ill 68/M with history of paroxysmal A-fib, chronic bilateral pleural effusion, diastolic CHF, CAD/CABG, PAD with balloon angioplasty, type 2 diabetes mellitus presented to the ED with multiple complaints, family had noticed increased lower extremity edema for a few weeks, in addition he has also had ongoing hallucination off-and-on for several weeks to months.  Recently underwent retinal surgery, has residual redness. - In the ED he was oriented, hypertensive, troponin 52, proBNP 3796, creatinine 1.07, albumin 2.2, EKG with A-fib heart rate in the 50s, chest x-ray with pulmonary vascular congestion, bibasilar consolidation and bilateral pleural effusions   Subjective: -Feels fair overall, has noticed hallucinations recently and worsening swelling  Assessment and Plan:  Acute on chronic diastolic heart failure Pleural effusions moderate, bilateral Hypertensive urgency - Last echo 2/25 with EF 60-65%, LVH, normal RV, moderately elevated PASP, mild to moderate MR  -Has significant hypoalbuminemia contributing to third spacing - Reviewed with diuresis, continue IV Lasix  1 more day, 3 L negative - Repeat echo with preserved EF, grade 2 DD, normal RV -increase activity, PT OT eval -Repeat x-ray tomorrow    Elevated troponin-secondary to demand ischemia - No ACS, monitor   Altered mental status/confusion and hallucination -Family allegedly reported hallucinations off-and-on for a while -Clear at this time, oriented x 3 with mild cognitive deficits - TSH pending, B12 low normal will add replacement - PT OT eval, increase activity  Iron  defi anemia -give IV Fe  Persistent atrial fibrillation Continue Eliquis , heart rate is controlled   History of CAD status post CABG History of PAD, angioplasty -Continue Eliquis  and Lipitor   Sacral decubitus  ulcer Continue skin care with foam dressing.  Consulting wound care for further management.   Diabetic retinopathy s/p right-sided retinal surgery 3- 4 days ago Continue ofloxacin  and prednisone eyedrop 4 times daily.   Non-insulin -dependent DM type II -Continue glipizide  and linagliptin .  Continue carb modified diet. -A1c is 9.1 indicating poor long-term control   Hyperlipidemia -Continue Lipitor   Generalized anxiety disorder -Continue primidone, Paxil  and Seroquel .   DVT prophylaxis:  Eliquis   Family communication: No family at bedside, called and updated [name] Code Status:  Full Code Disposition, home health services likely tomorrow  Consultants:    Procedures:   Antimicrobials:    Objective: Vitals:   08/20/23 0431 08/20/23 0434 08/20/23 0722 08/20/23 1131  BP: (!) 193/81 (!) 193/81 (!) 174/87 (!) 176/88  Pulse: (!) 49  61 62  Resp: 17  20 20   Temp: 98.4 F (36.9 C)  97.8 F (36.6 C) 97.8 F (36.6 C)  TempSrc: Oral  Oral Oral  SpO2: 96%  95% 97%  Weight:   71.4 kg   Height:        Intake/Output Summary (Last 24 hours) at 08/20/2023 1150 Last data filed at 08/20/2023 1032 Gross per 24 hour  Intake 607 ml  Output 2550 ml  Net -1943 ml   Filed Weights   08/18/23 1857 08/19/23 0500 08/20/23 0722  Weight: 73.7 kg 72.8 kg 71.4 kg    Examination:  General exam: Appears calm and comfortable, AAO x 2, mild cognitive deficits HEENT: Positive JVD, right eye with subconjunctival erythema Respiratory system: Few basilar rales Cardiovascular system: S1 & S2 heard, RRR.  Abd: nondistended, soft and nontender.Normal bowel sounds heard. Central nervous system: Alert and oriented. No focal neurological deficits. Extremities: Trace edema Skin: No rashes Psychiatry:  Pleasant,, flat affect.     Data Reviewed:   CBC: Recent Labs  Lab 08/18/23 1610 08/19/23 0346  WBC 5.5 5.9  NEUTROABS 3.9  --   HGB 9.8* 8.2*  HCT 30.9* 25.6*  MCV 87.0 86.5  PLT 214  173   Basic Metabolic Panel: Recent Labs  Lab 08/18/23 1610 08/19/23 0346 08/19/23 1052  NA 140 135  --   K 3.7 3.5  --   CL 102 105  --   CO2 25 25  --   GLUCOSE 194* 186*  --   BUN 28* 27*  --   CREATININE 0.96 1.07  --   CALCIUM  8.9 7.9*  --   MG  --   --  1.5*   GFR: Estimated Creatinine Clearance: 64.9 mL/min (by C-G formula based on SCr of 1.07 mg/dL). Liver Function Tests: Recent Labs  Lab 08/18/23 1610 08/19/23 0346  AST 21 14*  ALT 12 12  ALKPHOS 169* 106  BILITOT 0.8 0.6  PROT 7.1 5.7*  ALBUMIN 3.4* 2.2*   No results for input(s): LIPASE, AMYLASE in the last 168 hours. No results for input(s): AMMONIA in the last 168 hours. Coagulation Profile: No results for input(s): INR, PROTIME in the last 168 hours. Cardiac Enzymes: No results for input(s): CKTOTAL, CKMB, CKMBINDEX, TROPONINI in the last 168 hours. BNP (last 3 results) Recent Labs    03/01/23 1157 08/18/23 1610  PROBNP 399.0* 3,796.0*   HbA1C: No results for input(s): HGBA1C in the last 72 hours. CBG: Recent Labs  Lab 08/19/23 1124 08/19/23 1602 08/19/23 2121 08/20/23 0624 08/20/23 1129  GLUCAP 165* 179* 198* 125* 166*   Lipid Profile: No results for input(s): CHOL, HDL, LDLCALC, TRIG, CHOLHDL, LDLDIRECT in the last 72 hours. Thyroid  Function Tests: No results for input(s): TSH, T4TOTAL, FREET4, T3FREE, THYROIDAB in the last 72 hours. Anemia Panel: Recent Labs    08/19/23 1052  VITAMINB12 265  FOLATE 15.4  FERRITIN 55  TIBC 293  IRON  27*  RETICCTPCT 2.5   Urine analysis:    Component Value Date/Time   COLORURINE YELLOW 08/18/2023 1752   APPEARANCEUR CLEAR 08/18/2023 1752   LABSPEC 1.010 08/18/2023 1752   PHURINE 6.5 08/18/2023 1752   GLUCOSEU NEGATIVE 08/18/2023 1752   GLUCOSEU >=1000 (A) 03/16/2020 0958   HGBUR SMALL (A) 08/18/2023 1752   BILIRUBINUR NEGATIVE 08/18/2023 1752   BILIRUBINUR Negative 12/31/2022 1222   KETONESUR  NEGATIVE 08/18/2023 1752   PROTEINUR 100 (A) 08/18/2023 1752   UROBILINOGEN 1.0 12/31/2022 1222   UROBILINOGEN 0.2 03/16/2020 0958   NITRITE NEGATIVE 08/18/2023 1752   LEUKOCYTESUR NEGATIVE 08/18/2023 1752   Sepsis Labs: @LABRCNTIP (procalcitonin:4,lacticidven:4)  ) Recent Results (from the past 240 hours)  MRSA Next Gen by PCR, Nasal     Status: Abnormal   Collection Time: 08/18/23 10:38 PM   Specimen: Nasal Mucosa; Nasal Swab  Result Value Ref Range Status   MRSA by PCR Next Gen DETECTED (A) NOT DETECTED Final    Comment: RESULT CALLED TO, READ BACK BY AND VERIFIED WITH: AMIREHSANI RN 08/19/2023 @ 0001 BY AB (NOTE) The GeneXpert MRSA Assay (FDA approved for NASAL specimens only), is one component of a comprehensive MRSA colonization surveillance program. It is not intended to diagnose MRSA infection nor to guide or monitor treatment for MRSA infections. Test performance is not FDA approved in patients less than 78 years old. Performed at [facility], [address], [place], [place] [address]      Radiology Studies: ECHOCARDIOGRAM  COMPLETE Result Date: 08/19/2023    ECHOCARDIOGRAM REPORT   Patient Name:   LAMARIUS DIRR Date of Exam: 08/19/2023 Medical Rec #:  [id number]            Height:       74.0 in Accession #:    [id number]           Weight:       160.5 lb Date of Birth:  July 23, 1955             BSA:          1.979 m Patient Age:    68 years             BP:           158/71 mmHg Patient Gender: M                    HR:           52 bpm. Exam Location:  Inpatient Procedure: 2D Echo, Cardiac Doppler and Color Doppler (Both Spectral and Color            Flow Doppler were utilized during procedure). Indications:    Congestive Heart Failure I50.9  History:        Patient has prior history of Echocardiogram examinations, most                 recent 02/02/2023. CHF, CAD, Prior CABG, Arrythmias:Atrial                 Fibrillation; Risk Factors:Current Smoker, Hypertension,                  Diabetes and Dyslipidemia.  Sonographer:    Koleen Popper RDCS Referring Phys: [id number] SUBRINA SUNDIL IMPRESSIONS  1. Left ventricular ejection fraction, by estimation, is 55 to 60%. The left ventricle has normal function. The left ventricle has no regional wall motion abnormalities. There is mild asymmetric left ventricular hypertrophy of the basal-septal segment. Left ventricular diastolic parameters are consistent with Grade II diastolic dysfunction (pseudonormalization).  2. Right ventricular systolic function is normal. The right ventricular size is normal. Tricuspid regurgitation signal is inadequate for assessing PA pressure.  3. The mitral valve is normal in structure. Trivial mitral valve regurgitation. No evidence of mitral stenosis.  4. The aortic valve is tricuspid. There is moderate calcification of the aortic valve. Aortic valve regurgitation is not visualized. Aortic valve sclerosis/calcification is present, without any evidence of aortic stenosis.  5. The inferior vena cava is dilated in size with <50% respiratory variability, suggesting right atrial pressure of 15 mmHg.  6. Left pleural effusion noted. FINDINGS  Left Ventricle: Left ventricular ejection fraction, by estimation, is 55 to 60%. The left ventricle has normal function. The left ventricle has no regional wall motion abnormalities. The left ventricular internal cavity size was normal in size. There is  mild asymmetric left ventricular hypertrophy of the basal-septal segment. Left ventricular diastolic parameters are consistent with Grade II diastolic dysfunction (pseudonormalization). Right Ventricle: The right ventricular size is normal. No increase in right ventricular wall thickness. Right ventricular systolic function is normal. Tricuspid regurgitation signal is inadequate for assessing PA pressure. Left Atrium: Left atrial size was normal in size. Right Atrium: Right atrial size was normal in size. Pericardium: Left pleural  effusion noted. There is no evidence of pericardial effusion. Mitral Valve: The mitral valve is normal in structure. There is mild calcification of the mitral valve leaflet(s). Trivial mitral valve regurgitation.  No evidence of mitral valve stenosis. Tricuspid Valve: The tricuspid valve is normal in structure. Tricuspid valve regurgitation is not demonstrated. Aortic Valve: The aortic valve is tricuspid. There is moderate calcification of the aortic valve. Aortic valve regurgitation is not visualized. Aortic valve sclerosis/calcification is present, without any evidence of aortic stenosis. Pulmonic Valve: The pulmonic valve was normal in structure. Pulmonic valve regurgitation is trivial. Aorta: The aortic root is normal in size and structure. Venous: The inferior vena cava is dilated in size with less than 50% respiratory variability, suggesting right atrial pressure of 15 mmHg. IAS/Shunts: No atrial level shunt detected by color flow Doppler.  LEFT VENTRICLE PLAX 2D LVIDd:         5.10 cm LVIDs:         3.80 cm LV PW:         1.20 cm LV IVS:        1.30 cm LVOT diam:     2.40 cm LV SV:         104 LV SV Index:   52 LVOT Area:     4.52 cm  RIGHT VENTRICLE         IVC TAPSE (M-mode): 1.2 cm  IVC diam: 2.30 cm LEFT ATRIUM             Index LA diam:        3.90 cm 1.97 cm/m LA Vol (A2C):   38.9 ml 19.65 ml/m LA Vol (A4C):   43.2 ml 21.82 ml/m LA Biplane Vol: 42.2 ml 21.32 ml/m  AORTIC VALVE LVOT Vmax:   95.40 cm/s LVOT Vmean:  63.900 cm/s LVOT VTI:    0.229 m  AORTA Ao Root diam: 3.50 cm Ao Asc diam:  3.70 cm MITRAL VALVE MV Area (PHT): 2.69 cm     SHUNTS MV Decel Time: 282 msec     Systemic VTI:  0.23 m MV E velocity: 107.00 cm/s  Systemic Diam: 2.40 cm MV A velocity: 21.50 cm/s MV E/A ratio:  4.98 Dalton McleanMD Electronically signed by Ezra Kanner Signature Date/Time: 08/19/2023/7:06:44 PM    Final    CT CHEST WO CONTRAST Result Date: 08/18/2023 EXAM: CT CHEST WITHOUT CONTRAST 08/18/2023 09:18:51 PM  TECHNIQUE: CT of the chest was performed without the administration of intravenous contrast. Multiplanar reformatted images are provided for review. Automated exposure control, iterative reconstruction, and/or weight based adjustment of the mA/kV was utilized to reduce the radiation dose to as low as reasonably achievable. COMPARISON: None available. CLINICAL HISTORY: Bilateral pleural effusion and pneumonia. FINDINGS: MEDIASTINUM AND LYMPH NODES: Postsurgical changes in the mediastinum. Calcific aortic atherosclerosis. Coronary artery calcifications. LUNGS AND PLEURA: Right greater than left intermediate size pleural effusions. Bibasilar atelectasis. SOFT TISSUES/BONES: No acute abnormality of the bones or soft tissues. UPPER ABDOMEN: Limited images of the upper abdomen demonstrates no acute abnormality. IMPRESSION: 1. Right greater than left intermediate size pleural effusions with associated bibasilar atelectasis. 2. No acute findings of pneumonia. Electronically signed by: Franky Stanford MD 08/18/2023 11:28 PM EDT RP Workstation: [id number]   DG Chest Portable 1 View Result Date: 08/18/2023 CLINICAL DATA:  Short of breath, CHF EXAM: PORTABLE CHEST 1 VIEW COMPARISON:  03/01/2023 FINDINGS: Single frontal view of the chest demonstrates postsurgical changes from CABG. Cardiac silhouette is stable. Persistent bibasilar consolidation and effusions, right greater than left, not appreciably changed since prior exam. Central pulmonary vascular congestion unchanged. No pneumothorax. IMPRESSION: 1. Stable pulmonary vascular congestion, bibasilar consolidation, and bilateral pleural effusions. Electronically Signed   By:  Ozell Daring M.D.   On: 08/18/2023 16:21     Scheduled Meds:  amLODipine   2.5 mg Oral BID   apixaban   5 mg Oral BID   atorvastatin   40 mg Oral Daily   Chlorhexidine  Gluconate Cloth  6 each Topical Daily   furosemide   60 mg Intravenous BID   glipiZIDE   5 mg Oral BID AC   insulin  glargine  10  Units Subcutaneous Daily   linagliptin   5 mg Oral Daily   mirtazapine   15 mg Oral QHS   mupirocin  ointment  1 Application Nasal BID   ofloxacin   1 drop Right Eye QID   PARoxetine   20 mg Oral Daily   prednisoLONE  acetate  1 drop Right Eye QID   sodium chloride  flush  3 mL Intravenous Q12H   spironolactone   25 mg Oral Daily   Continuous Infusions:  iron  sucrose (VENOFER ) 300 mg in sodium chloride  0.9 % 250 mL IVPB 300 mg (08/20/23 1117)     LOS: 2 days    Time spent:    Sigurd Pac, MD [facility]   08/20/2023, 11:50 AM

## 2023-08-20 NOTE — TOC Initial Note (Signed)
 Transition of Care [facility]) - Initial/Assessment Note    Patient Details  Name: [name] [name] MRN: [id number] Date of Birth: 23-Jul-1955  Transition [facility]) CM/SW Contact:    Waddell Barnie Rama, RN Phone Number: 08/20/2023, 4:24 PM  Clinical Narrative:                 Patient gives NCM permission to speak with son, From home with son , has PCP and insurance on file, states has no HH services in place at this time , has a rollator, cane, and a w/chair at home.  States family member  (son) will transport them home at [employer] and family is support system, states gets [facility] [address].  Pta self ambulatory with rollator.   Per PT eval rec HHPT/HHOT,  patient has had [facility] in the past and son states they would really like to have them.  NCM made referral to [facility], she is able to take referral.  Soc will begin 24 to 48 hrs post dc.    Expected Discharge Plan: Home w Home Health Services Barriers to Discharge: Continued Medical Work up   Patient Goals and CMS Choice Patient states their goals for this hospitalization and ongoing recovery are:: return home with [facility] CMS Medicare.gov Compare Post Acute Care list provided to:: Patient Represenative (must comment) Choice offered to / list presented to : Adult Children      Expected Discharge Plan and Services   Discharge Planning Services: CM Consult Post Acute Care Choice: Home Health Living arrangements for the past 2 months: Single Family Home                   DME Agency: NA       HH Arranged: RN, PT, OT, Nurse's Aide HH Agency: [facility] Date HH Agency Contacted: 08/20/23 Time HH Agency Contacted: 1623 Representative spoke with at [facility] Agency: Arna  Prior Living Arrangements/Services Living arrangements for the past 2 months: Single Family Home Lives with:: Adult Children Patient language and need for interpreter reviewed:: Yes Do you feel safe going back to the place where you live?:  Yes      Need for Family Participation in Patient Care: Yes (Comment) Care giver support system in place?: Yes (comment) Current home services: DME (rollator, w/chair , cane) Criminal Activity/Legal Involvement Pertinent to Current Situation/Hospitalization: No - Comment as needed  Activities of Daily Living   ADL Screening (condition at time of admission) Independently performs ADLs?: No Does the patient have a NEW difficulty with bathing/dressing/toileting/self-feeding that is expected to last >3 days?: Yes (Initiates electronic notice to provider for possible OT consult) Does the patient have a NEW difficulty with getting in/out of bed, walking, or climbing stairs that is expected to last >3 days?: Yes (Initiates electronic notice to provider for possible PT consult) Does the patient have a NEW difficulty with communication that is expected to last >3 days?: No Is the patient deaf or have difficulty hearing?: Yes Does the patient have difficulty seeing, even when wearing glasses/contacts?: Yes Does the patient have difficulty concentrating, remembering, or making decisions?: No  Permission Sought/Granted Permission sought to share information with : Case Manager Permission granted to share information with : Yes, Verbal Permission Granted  Share Information with NAME: joshua  Permission granted to share info w AGENCY: [facility]  Permission granted to share info w Relationship: son     Emotional Assessment Appearance:: Appears stated age Attitude/Demeanor/Rapport: Engaged Affect (typically observed): Appropriate  Orientation: : Oriented to Place, Oriented to Self, Oriented to  Time, Oriented to Situation Alcohol / Substance Use: Not Applicable Psych Involvement: No (comment)  Admission diagnosis:  Acute right-sided congestive heart failure (HCC) [I50.811] CHF exacerbation (HCC) [I50.9] Patient Active Problem List   Diagnosis Date Noted   CHF exacerbation (HCC) 08/18/2023    Elevated troponin 08/18/2023   Altered mental status 08/18/2023   Sacral decubitus ulcer 08/18/2023   History of CAD (coronary artery disease) 08/18/2023   Bilateral pleural effusion 08/18/2023   GAD (generalized anxiety disorder) 08/18/2023   CHF (congestive heart failure) (HCC) 02/03/2023   Acute on chronic heart failure with preserved ejection fraction (HFpEF) (HCC) 02/02/2023   Hypertensive urgency 02/02/2023   CVA (cerebral vascular accident) (HCC) 11/30/2022   Heart valve vegetation 11/23/2022   Noncompliance 11/20/2022   Requires continuous supervision for activities of daily living (ADL) 11/20/2022   Hypertensive emergency 11/20/2022   Pre-ulcerative calluses 03/05/2022   Protein-calorie malnutrition, severe 01/06/2022   UTI (urinary tract infection) 01/05/2022   Acute urinary retention 01/04/2022   Acute on chronic diastolic CHF (congestive heart failure) (HCC) 01/04/2022   Atrial fibrillation with slow ventricular response (HCC) 01/03/2022   Visual hallucinations 01/03/2022   Hematuria 01/03/2022   Type 2 diabetes mellitus with proliferative retinopathy of both eyes, with long-term current use of insulin  (HCC) 08/18/2021   Type 2 diabetes mellitus with diabetic polyneuropathy, with long-term current use of insulin  (HCC) 08/18/2021   Type 2 diabetes mellitus (HCC) 08/18/2021   Postherpetic neuralgia 08/11/2021   Blood clotting disorder (HCC) 07/12/2021   Type 1 diabetes mellitus (HCC) 02/16/2021   Chronic diarrhea 02/16/2021   Diarrhea 02/15/2021   Sinus bradycardia 02/15/2021   Acute exacerbation of CHF (congestive heart failure) (HCC) 02/14/2021   Iron  deficiency anemia secondary to inadequate dietary iron  intake 07/14/2020   Diabetic ulcer of left foot associated with type 1 diabetes mellitus (HCC) 06/30/2020   Pressure injury of skin 05/24/2020   BPH with obstruction/lower urinary tract symptoms 03/16/2020   Paroxysmal A-fib (HCC) 03/16/2020   Type 1 diabetes  mellitus with diabetic foot infection (HCC) 01/27/2020   Normocytic anemia 01/13/2020   Encounter for general adult medical examination with abnormal findings 01/13/2020   Hyperlipidemia LDL goal <70 01/13/2020   Benign prostatic hyperplasia without lower urinary tract symptoms 01/13/2020   Peripheral artery disease (HCC) 01/13/2020   Coronary artery disease involving bypass graft of transplanted heart without angina pectoris 01/13/2020   Paroxysmal atrial fibrillation (HCC) 01/13/2020   Recurrent major depressive disorder, in partial remission (HCC) 01/13/2020   Major depression, recurrent (HCC) 12/09/2019   Tear of lateral meniscus of knee 10/05/2019   Aortic dissection (HCC) 02/05/2019   Coronary artery disease 02/05/2019   Hypertension 02/05/2019   Psoriasis 02/05/2019   Vitamin D  deficiency 01/07/2019   Uncontrolled type 2 diabetes mellitus with hyperglycemia (HCC) 01/07/2019   Microalbuminuria 01/07/2019   Depression 06/10/2018   Patellar tendonitis of right knee 04/24/2018   Peyronie's disease 06/09/2015   Erectile dysfunction 03/15/2015   S/P aortic aneurysm repair 05/19/2012   S/P CABG (coronary artery bypass graft) 05/19/2012   Diabetic retinopathy (HCC) 02/27/2011   Contracture of hand joint 11/01/2010   PCP:  [name] HERO, PA-C Pharmacy:   [id number] [address] [place], [place] - [address] [place] [address] Phone: [phone] Fax: [phone]     Social Drivers of Health (SDOH) Social History: SDOH Screenings   Food Insecurity: No Food Insecurity (08/19/2023)  Recent  Concern: Food Insecurity - Food Insecurity Present (08/18/2023)  Housing: Unknown (08/19/2023)  Transportation Needs: No Transportation Needs (08/19/2023)  Utilities: Not At Risk (08/18/2023)  Alcohol Screen: Low Risk  (08/19/2023)  Depression (PHQ2-9): Medium Risk (07/26/2023)  Financial Resource Strain: Low Risk  (08/19/2023)  Physical Activity: Inactive  (11/21/2022)  Social Connections: Socially Isolated (08/18/2023)  Stress: Stress Concern Present (11/21/2022)  Tobacco Use: High Risk (08/18/2023)  Health Literacy: Adequate Health Literacy (11/21/2022)   SDOH Interventions: Food Insecurity Interventions: Intervention Not Indicated Housing Interventions: Intervention Not Indicated Transportation Interventions: Intervention Not Indicated Alcohol Usage Interventions: Intervention Not Indicated (Score <7) Financial Strain Interventions: Intervention Not Indicated   Readmission Risk Interventions    08/20/2023    4:20 PM  Readmission Risk Prevention Plan  Transportation Screening Complete  PCP or Specialist Appt within 3-5 Days Complete  HRI or Home Care Consult Complete  Palliative Care Screening Not Applicable  Medication Review (RN Care Manager) Complete

## 2023-08-20 NOTE — Plan of Care (Signed)

## 2023-08-20 NOTE — Progress Notes (Signed)
 Mobility Specialist Progress Note:   08/20/23 1045  Mobility  Activity Pivoted/transferred from chair to bed  Level of Assistance Minimal assist, patient does 75% or more  Assistive Device Other (Comment) (HHA)  Distance Ambulated (ft) 2 ft  Activity Response Tolerated well  Mobility Referral Yes  Mobility visit 1 Mobility  Mobility Specialist Start Time (ACUTE ONLY) 1045  Mobility Specialist Stop Time (ACUTE ONLY) 1056  Mobility Specialist Time Calculation (min) (ACUTE ONLY) 11 min   Chair alarm going off, pt attempting to transfer to bed. Found sitting on leg rest of chair. Able to stand with minA via HHA and pivot to bed. Pt c/o buttock wound pain. Back in bed with all needs met.  Therisa Rana Mobility Specialist Please contact via SecureChat or  Rehab office at [phone]

## 2023-08-21 ENCOUNTER — Inpatient Hospital Stay (HOSPITAL_COMMUNITY)

## 2023-08-21 DIAGNOSIS — E785 Hyperlipidemia, unspecified: Secondary | ICD-10-CM

## 2023-08-21 DIAGNOSIS — E1169 Type 2 diabetes mellitus with other specified complication: Secondary | ICD-10-CM | POA: Diagnosis not present

## 2023-08-21 DIAGNOSIS — I480 Paroxysmal atrial fibrillation: Secondary | ICD-10-CM | POA: Diagnosis not present

## 2023-08-21 DIAGNOSIS — Z8679 Personal history of other diseases of the circulatory system: Secondary | ICD-10-CM | POA: Diagnosis not present

## 2023-08-21 DIAGNOSIS — I5033 Acute on chronic diastolic (congestive) heart failure: Secondary | ICD-10-CM | POA: Diagnosis not present

## 2023-08-21 LAB — CBC
HCT: 32.1 % — ABNORMAL LOW (ref 39.0–52.0)
Hemoglobin: 10.4 g/dL — ABNORMAL LOW (ref 13.0–17.0)
MCH: 27.4 pg (ref 26.0–34.0)
MCHC: 32.4 g/dL (ref 30.0–36.0)
MCV: 84.7 fL (ref 80.0–100.0)
Platelets: 229 K/uL (ref 150–400)
RBC: 3.79 MIL/uL — ABNORMAL LOW (ref 4.22–5.81)
RDW: 18.5 % — ABNORMAL HIGH (ref 11.5–15.5)
WBC: 6.9 K/uL (ref 4.0–10.5)
nRBC: 0 % (ref 0.0–0.2)

## 2023-08-21 LAB — BASIC METABOLIC PANEL WITH GFR
Anion gap: 13 (ref 5–15)
BUN: 25 mg/dL — ABNORMAL HIGH (ref 8–23)
CO2: 26 mmol/L (ref 22–32)
Calcium: 8.6 mg/dL — ABNORMAL LOW (ref 8.9–10.3)
Chloride: 96 mmol/L — ABNORMAL LOW (ref 98–111)
Creatinine, Ser: 1.01 mg/dL (ref 0.61–1.24)
GFR, Estimated: >60 mL/min (ref >60)
Glucose, Bld: 99 mg/dL (ref 70–99)
Potassium: 3.8 mmol/L (ref 3.5–5.1)
Sodium: 135 mmol/L (ref 135–145)

## 2023-08-21 LAB — GLUCOSE, CAPILLARY
Glucose-Capillary: 100 mg/dL — ABNORMAL HIGH (ref 70–99)
Glucose-Capillary: 91 mg/dL (ref 70–99)

## 2023-08-21 MED ORDER — INSULIN ASPART 100 UNIT/ML IJ SOLN
0.0000 [IU] | Freq: Three times a day (TID) | INTRAMUSCULAR | Status: DC
  Administered 2023-08-22: 2 [IU] via SUBCUTANEOUS

## 2023-08-21 NOTE — TOC Transition Note (Signed)
 Transition of Care [facility]) - Discharge Note   Patient Details  Name: [name] [name] MRN: [id number] Date of Birth: 1955/03/14  [facility]) CM/SW Contact:  Waddell Barnie Rama, RN Phone Number: 08/21/2023, 12:34 PM   Clinical Narrative:    For possible dc today, he is set up with [facility] services.  He has transport home at dc.    Final next level of care: Home w Home Health Services Barriers to Discharge: Continued Medical Work up   Patient Goals and CMS Choice Patient states their goals for this hospitalization and ongoing recovery are:: return home with [facility] CMS Medicare.gov Compare Post Acute Care list provided to:: Patient Represenative (must comment) Choice offered to / list presented to : Adult Children      Discharge Placement                       Discharge Plan and Services Additional resources added to the After Visit Summary for     Discharge Planning Services: CM Consult Post Acute Care Choice: Home Health            DME Agency: NA       HH Arranged: RN, PT, OT, Nurse's Aide HH Agency: [facility] Date [facility] Agency Contacted: 08/20/23 Time HH Agency Contacted: 1623 Representative spoke with at [facility] Agency: Arna  Social Drivers of Health (SDOH) Interventions SDOH Screenings   Food Insecurity: No Food Insecurity (08/19/2023)  Recent Concern: Food Insecurity - Food Insecurity Present (08/18/2023)  Housing: Unknown (08/19/2023)  Transportation Needs: No Transportation Needs (08/19/2023)  Utilities: Not At Risk (08/18/2023)  Alcohol Screen: Low Risk  (08/19/2023)  Depression (PHQ2-9): Medium Risk (07/26/2023)  Financial Resource Strain: Low Risk  (08/19/2023)  Physical Activity: Inactive (11/21/2022)  Social Connections: Socially Isolated (08/18/2023)  Stress: Stress Concern Present (11/21/2022)  Tobacco Use: High Risk (08/18/2023)  Health Literacy: Adequate Health Literacy (11/21/2022)     Readmission Risk Interventions    08/20/2023     4:20 PM  Readmission Risk Prevention Plan  Transportation Screening Complete  PCP or Specialist Appt within 3-5 Days Complete  HRI or Home Care Consult Complete  Palliative Care Screening Not Applicable  Medication Review (RN Care Manager) Complete

## 2023-08-21 NOTE — Inpatient Diabetes Management (Addendum)
 Inpatient Diabetes Program Recommendations  AACE/ADA: New Consensus Statement on Inpatient Glycemic Control   Target Ranges:  Prepandial:   less than 140 mg/dL      Peak postprandial:   less than 180 mg/dL (1-2 hours)      Critically ill patients:  140 - 180 mg/dL    Latest Reference Range & Units 08/20/23 06:24 08/20/23 11:29 08/20/23 15:58 08/20/23 21:12 08/21/23 06:47  Glucose-Capillary 70 - 99 mg/dL 874 (H) 833 (H) 808 (H) 201 (H) 100 (H)   Review of Glycemic Control  Diabetes history: DM2 Outpatient Diabetes medications: Glipizide  5 mg BID, Januvia  100 mg daily Current orders for Inpatient glycemic control: Lantus  10 units daily, Tradjenta  5 mg daily, Glipizide  5 mg BID  Inpatient Diabetes Program Recommendations:    Insulin : Please consider ordering Novolog  0-9 units TID with meals and Novolog  0-5 units at bedtime.  Thanks, Earnie Gainer, RN, MSN, CDCES Diabetes Coordinator Inpatient Diabetes Program [phone] (Team Pager from 8am to 5pm)

## 2023-08-21 NOTE — Assessment & Plan Note (Signed)
 Rate controlled Continue anticoagulation with apixaban 

## 2023-08-21 NOTE — Assessment & Plan Note (Signed)
 Echocardiogram with preserved LV systolic function with EF 55 to 60%, mild asymmetric left ventricular hypertrophy of the basal septal segment. RV systolic function preserved, no significant valvular disease.   Urine output is 3,250 ml Systolic blood pressure 170 mmHg range  Plan to continue diuresis with furosemide  60 mg IV bid Continue spironolactone .   Acute cardiogenic pulmonary edema with bilateral pleural effusions, more right than left.  Follow up chest radiograph with improvement in right pleural effusions.  Continue diuresis and oxymetry monitoring

## 2023-08-21 NOTE — Plan of Care (Signed)
  Problem: Nutrition: Goal: Adequate nutrition will be maintained Outcome: Completed/Met

## 2023-08-21 NOTE — Assessment & Plan Note (Signed)
 Continue blood pressure monitoring  No acute coronary syndrome

## 2023-08-21 NOTE — Care Management Important Message (Signed)
 Important Message  Patient Details  Name: [name] [name] MRN: [id number] Date of Birth: 09-15-1955   Important Message Given:  Yes - Medicare IM     Vonzell Arrie Sharps 08/21/2023, 11:56 AM

## 2023-08-21 NOTE — Assessment & Plan Note (Addendum)
 Patient was placed on insulin  sliding scale and basal insulin  for glucose cover and monitoring.  At the time of discharge will resume his diabetic regimen with sitagliptin  and glipizide .   Continue statin therapy

## 2023-08-21 NOTE — Progress Notes (Signed)
  Progress Note   Patient: [name] [name] FMW:[id number] DOB: 08-29-1955 DOA: 08/18/2023     3 DOS: the patient was seen and examined on 08/21/2023   Brief hospital course: Mr. Lincks was admitted to the hospital with the working diagnosis of heart failure exacerbation   68/M with history of paroxysmal A-fib, chronic bilateral pleural effusion, diastolic CHF, CAD/CABG, PAD with balloon angioplasty, type 2 diabetes mellitus presented to the ED with multiple complaints, family had noticed increased lower extremity edema for a few weeks, in addition he has also had ongoing hallucination off-and-on for several weeks to months.  Recently underwent retinal surgery, has residual redness. - In the ED he was oriented, hypertensive, troponin 52, proBNP 3796, creatinine 1.07, albumin 2.2, EKG with A-fib heart rate in the 50s, chest x-ray with pulmonary vascular congestion, bibasilar consolidation and bilateral pleural effusions  Assessment and Plan: * Acute on chronic diastolic CHF (congestive heart failure) (HCC) Echocardiogram with preserved LV systolic function with EF 55 to 60%, mild asymmetric left ventricular hypertrophy of the basal septal segment. RV systolic function preserved, no significant valvular disease.   Urine output is 3,250 ml Systolic blood pressure 170 mmHg range  Plan to continue diuresis with furosemide  60 mg IV bid Continue spironolactone .   Acute cardiogenic pulmonary edema with bilateral pleural effusions, more right than left.  Follow up chest radiograph with improvement in right pleural effusions.  Continue diuresis and oxymetry monitoring    Paroxysmal A-fib (HCC) Rate controlled Continue anticoagulation with apixaban    History of CAD (coronary artery disease) Continue blood pressure monitoring  No acute coronary syndrome   Type 2 diabetes mellitus with hyperlipidemia (HCC) Continue basal insulin  10 units, daily and will add insulin  sliding scale for glucose  cover and monitoring  Hold on glipizide  and linagliptin    Sacral decubitus ulcer Continue wound care  GAD (generalized anxiety disorder) Continue with mirtazapine , continue paroxetine ,         Subjective: Patient is feeling better, dyspnea and edema have improved, continue very weak and deconditioned   Physical Exam: Vitals:   08/21/23 0000 08/21/23 0358 08/21/23 0738 08/21/23 1400  BP: (!) 144/80 (!) 165/84 (!) 163/80 (!) 172/95  Pulse: (!) 45 (!) 57 (!) 56 62  Resp: 19 17 14 20   Temp: 98.2 F (36.8 C) 98.9 F (37.2 C) 98.4 F (36.9 C) 99.3 F (37.4 C)  TempSrc: Axillary Oral Oral Oral  SpO2: 95% 95% 98% 97%  Weight:  69.1 kg    Height:       Neurology awake and alert ENT with mild pallor Cardiovascular with S1 and S2 present and regular with no gallops or rubs Respiratory with no rales or wheezing, no rhonchi, decreased breath sounds at bases, with poor inspiratory effort.  Abdomen with no distention  Trace lower extremity edema  Data Reviewed:    Family Communication: no family at the bedside   Disposition: Status is: Inpatient Remains inpatient appropriate because: recovering heart failure   Planned Discharge Destination: Home     Author: Elidia Toribio Furnace, MD 08/21/2023 4:13 PM  For on call review [email or web address].

## 2023-08-21 NOTE — Assessment & Plan Note (Addendum)
 Continue with mirtazapine , continue paroxetine ,

## 2023-08-21 NOTE — Progress Notes (Signed)
 Physical Therapy Treatment Patient Details Name: [name] [name] MRN: [id number] DOB: 27-May-1955 Today's Date: 08/21/2023   History of Present Illness Pt is a 68 yr old male who presented 08/18/23 due to LE edema, wound HTN, and hallucinations. Pt admitted due to acute on chronic CHF. Pt's chest xray showed bilateral pleural effusion.  PMH: A-fib on Eliquis , CAD s/p CABG, PAD, diastolic CHF, CVA, DM2 on insulin  pump, HTN, HLD, depression, eye sx to R eye    PT Comments  The pt needs encouragement to participate and push himself during therapy sessions. He was agreeable to get OOB to complete a BM though. Challenged pt's standing balance through encouraging pt to reach off his COG with 1-no UE support to perform pericare and wash his hands at the sink. He only needed CGA and did not lose balance when reaching off COG with 1 UE support but did mildly sway posteriorly and need intermittent minA to prevent LOB with standing tasks without UE support. The pt progressed to only needing CGA for safety with bed mobility and transfers, but displayed some increased balance and endurance deficits when ambulating, needing intermittent minA to prevent a posterior LOB. Pt fatigues quickly and declined attempts at ambulating further this date, but was agreeable to seated lower extremity exercises to improve his leg strength prior to return to bed. Encouraged pt to try to eat to provide his body with nutrients and improve his strength. Will continue to follow acutely.     If plan is discharge home, recommend the following: A little help with walking and/or transfers;A little help with bathing/dressing/bathroom;Assistance with cooking/housework;Direct supervision/assist for medications management;Direct supervision/assist for financial management;Assist for transportation;Help with stairs or ramp for entrance   Can travel by private vehicle        Equipment Recommendations  None recommended by PT     Recommendations for Other Services       Precautions / Restrictions Precautions Precautions: Fall Recall of Precautions/Restrictions: Intact Restrictions Weight Bearing Restrictions Per Provider Order: No     Mobility  Bed Mobility Overal bed mobility: Needs Assistance Bed Mobility: Supine to Sit, Sit to Supine     Supine to sit: HOB elevated, Used rails, Contact guard Sit to supine: Contact guard assist, Used rails   General bed mobility comments: Extra time to ascend trunk to sit R EOB. CGA for safety with supine <> sit transitions. ModA to scoot superiorly in bed, cuing pt to place feet on bed and push through them to scoot superiorly.    Transfers Overall transfer level: Needs assistance Equipment used: Rolling walker (2 wheels) Transfers: Sit to/from Stand Sit to Stand: Contact guard assist           General transfer comment: Pt able to stand from EOB 2x and from commode 4x with UE reliance and CGA for safety, no LOB noted    Ambulation/Gait Ambulation/Gait assistance: Contact guard assist, Min assist Gait Distance (Feet): 30 Feet (x3 bouts of ~ 8 ft > ~10 ft > ~30 ft) Assistive device: Rolling walker (2 wheels) Gait Pattern/deviations: Step-through pattern, Decreased stride length, Narrow base of support, Trunk flexed Gait velocity: decreased Gait velocity interpretation: <1.31 ft/sec, indicative of household ambulator   General Gait Details: Pt ambulates with a mildly flexed posture and narrow BOS. Pt swaying posteriorly intermittently, needing intermittent minA for balance recovery and safety, otherwise only needing CGA for safety. Pt declining attempts at further gait due to feeling weak and fear of falling despite encouragement to push himself  while working with PT.   Stairs             Wheelchair Mobility     Tilt Bed    Modified Rankin (Stroke Patients Only)       Balance Overall balance assessment: Needs assistance Sitting-balance  support: Feet supported Sitting balance-Leahy Scale: Good     Standing balance support: Bilateral upper extremity supported, No upper extremity supported, Single extremity supported Standing balance-Leahy Scale: Poor Standing balance comment: Able to stand at sink to wash hands without UE support with mild intermittent posterior sway and intermittent minA for balance. Pt able to stand and reach off COG to perform pericare with 1 UE support with CGA for safety. Pt reliant on RW to ambulate, needing intermittent minA for balance as well.                            Communication Communication Communication: No apparent difficulties  Cognition Arousal: Alert Behavior During Therapy: WFL for tasks assessed/performed   PT - Cognitive impairments: No apparent impairments                       PT - Cognition Comments: needs encouragement to participate and to eat Following commands: Intact      Cueing Cueing Techniques: Verbal cues  Exercises General Exercises - Lower Extremity Long Arc Quad: AROM, Strengthening, Both, 10 reps, Seated Hip ABduction/ADduction: AROM, Strengthening, Both, 15 reps, Seated Hip Flexion/Marching: AROM, Strengthening, Both, 10 reps, Seated    General Comments General comments (skin integrity, edema, etc.): educated pt on importance of frequent mobility and eating to regain strength and improve his recovery to baseline, pt declining to sit in chair end of session      Pertinent Vitals/Pain Pain Assessment Pain Assessment: Faces Faces Pain Scale: Hurts even more Pain Location: abdomen (upset from constipation) Pain Descriptors / Indicators: Other (Comment), Discomfort (upset from constipation) Pain Intervention(s): Limited activity within patient's tolerance, Monitored during session, Repositioned    Home Living                          Prior Function            PT Goals (current goals can now be found in the care plan  section) Acute Rehab PT Goals Patient Stated Goal: return home PT Goal Formulation: With patient Time For Goal Achievement: 09/03/23 Potential to Achieve Goals: Good Progress towards PT goals: Progressing toward goals    Frequency    Min 2X/week      PT Plan      Co-evaluation              AM-PAC PT 6 Clicks Mobility   Outcome Measure  Help needed turning from your back to your side while in a flat bed without using bedrails?: A Little Help needed moving from lying on your back to sitting on the side of a flat bed without using bedrails?: A Little Help needed moving to and from a bed to a chair (including a wheelchair)?: A Little Help needed standing up from a chair using your arms (e.g., wheelchair or bedside chair)?: A Little Help needed to walk in hospital room?: A Little Help needed climbing 3-5 steps with a railing? : A Lot 6 Click Score: 17    End of Session   Activity Tolerance: Patient tolerated treatment well Patient left: in bed;with call bell/phone within  reach;with bed alarm set;with SCD's reapplied   PT Visit Diagnosis: Unsteadiness on feet (R26.81);Other abnormalities of gait and mobility (R26.89);Muscle weakness (generalized) (M62.81);History of falling (Z91.81);Difficulty in walking, not elsewhere classified (R26.2)     Time: 8770-8698 PT Time Calculation (min) (ACUTE ONLY): 32 min  Charges:    $Gait Training: 8-22 mins $Therapeutic Activity: 8-22 mins PT General Charges $$ ACUTE PT VISIT: 1 Visit                     Theo Ferretti, PT, DPT Acute Rehabilitation Services  Office: [phone]    Theo CHRISTELLA Ferretti 08/21/2023, 2:25 PM

## 2023-08-21 NOTE — Progress Notes (Signed)
 OT Cancellation Note  Patient Details Name: [name] [name] MRN: [id number] DOB: [birth year]   Cancelled Treatment:    Reason Eval/Treat Not Completed: Other (comment) (Pt reporting they had a rough night with constipation and just had a BM and would now like to rest. Will follow up.) Armetta Henri K OTR/L  Acute Rehab Services  [phone] office number   Warrick Berber 08/21/2023, 7:28 AM

## 2023-08-21 NOTE — Hospital Course (Addendum)
 Mr. Pallone was admitted to the hospital with the working diagnosis of heart failure exacerbation   68/M with history of paroxysmal A-fib, chronic bilateral pleural effusion, diastolic CHF, CAD/CABG, PAD with balloon angioplasty, type 2 diabetes mellitus presented to the ED with multiple complaints, family had noticed increased lower extremity edema for a few weeks, in addition he has also had ongoing hallucination off-and-on for several weeks to months.  Recently underwent retinal surgery, has residual redness. - In the ED he was oriented, hypertensive, troponin 52, proBNP 3796, creatinine 1.07, albumin 2.2, EKG with A-fib heart rate in the 50s, chest x-ray with pulmonary vascular congestion, bibasilar consolidation and bilateral pleural effusions

## 2023-08-21 NOTE — Progress Notes (Signed)
 OT Cancellation Note  Patient Details Name: [name] [name] MRN: [id number] DOB: 1955/06/20   Cancelled Treatment:    Reason Eval/Treat Not Completed: Other (comment) (Pt was sleeping soundly when entering the room.Pt then reported hey can not work therapy asnow constipated.Pt asking for medication for constipation but was educated about moving/mobility would assist but still decline.Asked when a good time to return but was unclear. Will continue to follow up)  Warrick POUR OTR/L  Acute Rehab Services  [phone] office number   Warrick Berber 08/21/2023, 11:57 AM

## 2023-08-21 NOTE — Assessment & Plan Note (Signed)
 Continue wound care

## 2023-08-21 NOTE — Progress Notes (Signed)
 Patient refused overnight labs and requests to reschedule them for the morning. I notified phlebotomy and they stated that they will make a note for the next shift to collect them.

## 2023-08-22 ENCOUNTER — Other Ambulatory Visit (HOSPITAL_COMMUNITY): Payer: Self-pay

## 2023-08-22 DIAGNOSIS — I5033 Acute on chronic diastolic (congestive) heart failure: Secondary | ICD-10-CM | POA: Diagnosis not present

## 2023-08-22 DIAGNOSIS — Z8679 Personal history of other diseases of the circulatory system: Secondary | ICD-10-CM | POA: Diagnosis not present

## 2023-08-22 DIAGNOSIS — E1169 Type 2 diabetes mellitus with other specified complication: Secondary | ICD-10-CM | POA: Diagnosis not present

## 2023-08-22 DIAGNOSIS — Z862 Personal history of diseases of the blood and blood-forming organs and certain disorders involving the immune mechanism: Secondary | ICD-10-CM

## 2023-08-22 DIAGNOSIS — L89159 Pressure ulcer of sacral region, unspecified stage: Secondary | ICD-10-CM

## 2023-08-22 DIAGNOSIS — I480 Paroxysmal atrial fibrillation: Secondary | ICD-10-CM | POA: Diagnosis not present

## 2023-08-22 LAB — MAGNESIUM: Magnesium: 1.8 mg/dL (ref 1.7–2.4)

## 2023-08-22 LAB — BASIC METABOLIC PANEL WITH GFR
Anion gap: 12 (ref 5–15)
BUN: 28 mg/dL — ABNORMAL HIGH (ref 8–23)
CO2: 28 mmol/L (ref 22–32)
Calcium: 8.4 mg/dL — ABNORMAL LOW (ref 8.9–10.3)
Chloride: 95 mmol/L — ABNORMAL LOW (ref 98–111)
Creatinine, Ser: 1.27 mg/dL — ABNORMAL HIGH (ref 0.61–1.24)
GFR, Estimated: >60 mL/min (ref >60)
Glucose, Bld: 169 mg/dL — ABNORMAL HIGH (ref 70–99)
Potassium: 3.8 mmol/L (ref 3.5–5.1)
Sodium: 135 mmol/L (ref 135–145)

## 2023-08-22 LAB — GLUCOSE, CAPILLARY
Glucose-Capillary: 89 mg/dL (ref 70–99)
Glucose-Capillary: 167 mg/dL — ABNORMAL HIGH (ref 70–99)

## 2023-08-22 MED ORDER — FUROSEMIDE 40 MG PO TABS: 40.0000 mg | ORAL_TABLET | Freq: Every day | ORAL | Status: DC

## 2023-08-22 MED ORDER — FUROSEMIDE 40 MG PO TABS
40.0000 mg | ORAL_TABLET | Freq: Every day | ORAL | 0 refills | Status: DC
  Filled 2023-08-22: qty 30, 30d supply, fill #0

## 2023-08-22 MED ORDER — GLIPIZIDE 5 MG PO TABS: 5.0000 mg | ORAL_TABLET | Freq: Two times a day (BID) | ORAL | 3 refills | Status: DC

## 2023-08-22 MED ORDER — LOSARTAN POTASSIUM 25 MG PO TABS
25.0000 mg | ORAL_TABLET | Freq: Every day | ORAL | 0 refills | Status: DC
  Filled 2023-08-22: qty 30, 30d supply, fill #0

## 2023-08-22 MED ORDER — LOSARTAN POTASSIUM 25 MG PO TABS: 25.0000 mg | ORAL_TABLET | Freq: Every day | ORAL | Status: DC

## 2023-08-22 MED FILL — Furosemide Tab 20 MG: ORAL | 90 days supply | Qty: 90 | Fill #0 | Status: CN

## 2023-08-22 NOTE — Assessment & Plan Note (Signed)
 Patient had iron  sucrose 300 mg on 08/19 Plan to follow up iron  stores as outpatient

## 2023-08-22 NOTE — Plan of Care (Signed)
  Problem: Clinical Measurements: Goal: Ability to maintain clinical measurements within normal limits will improve Outcome: Progressing   Problem: Health Behavior/Discharge Planning: Goal: Ability to manage health-related needs will improve Outcome: Progressing   Problem: Clinical Measurements: Goal: Will remain free from infection Outcome: Progressing   Problem: Activity: Goal: Risk for activity intolerance will decrease Outcome: Progressing   Problem: Clinical Measurements: Goal: Cardiovascular complication will be avoided Outcome: Progressing

## 2023-08-22 NOTE — Discharge Summary (Addendum)
 Physician Discharge Summary   Patient: [name] [name] MRN: [id number] DOB: 10-Feb-1955  Admit date:     08/18/2023  Discharge date: 08/22/23  Discharge Physician: Elidia Sieving Izzak Fries   PCP: [name] HERO, PA-C   Recommendations at discharge:   Patient has been placed on furosemide  40 mg po daily for diuresis.  Continue guideline directed medical therapy with losartan  and spironolactone . Assess possible addition of SGLT 2 inh as outpatient. No B blocker due to risk of bradycardia  Follow up renal function and electrolytes in 7 days Follow up with Alyssa [name] PA-C in 7 to 10 days Follow up with Cardiology as scheduled   I spoke with patient's son over the phone, we talked in detail about patient's condition, plan of care and prognosis and all questions were addressed.   Discharge Diagnoses: Principal Problem:   Acute on chronic diastolic CHF (congestive heart failure) (HCC) Active Problems:   Paroxysmal A-fib (HCC)   History of CAD (coronary artery disease)   Type 2 diabetes mellitus with hyperlipidemia (HCC)   Sacral decubitus ulcer   GAD (generalized anxiety disorder)  Resolved Problems:   * No resolved hospital problems. [name] [name] Course: Mr. Berent was admitted to the hospital with the working diagnosis of heart failure exacerbation   68/M with history of paroxysmal A-fib, chronic bilateral pleural effusion, diastolic CHF, CAD/CABG, PAD with balloon angioplasty, type 2 diabetes mellitus presented to the ED with multiple complaints, family had noticed increased lower extremity edema for a 10 days, in addition he has also had ongoing hallucination off-and-on for several weeks to months.  Recently underwent retinal surgery, has residual redness. On his initial physical examination his blood pressure was 186/84, HR 59, RR 25 and 02 saturation 95%, Lungs with no wheezing, decreased breath sounds at bases, heart with S1 and S2 present and regular with no gallops or  rubs, abdomen with no distention and positive lower extremity edema.   Na 140, K 3,7 Cl 102, glucose 194 bun 28 cr 0,96  AST 21 ALT 12  BNP 3,796  High sensitive troponin 52  Iron  25, TIBC 293, transferrin saturation 9 and ferritin 55  Wbc 5.5 hgb 9,8 plt 214  Urine analysis SG 1,010, protein 100, leukocytes negative, 0-5 wbc, 0-5 rbc   Chest radiograph with left rotation, cardiomegaly, bilateral hilar vascular congestin, bilateral pleural effusions, more left than right. Sternotomy wires in place.  CT chest with bilateral pleural effusions, right greater than left.   EKG 52 bpm, normal axis, normal intervals, qtc 450, atrial fibrillation rhythm with no significant ST segment or  T wave changes.  Patient was placed on IV furosemide  for diuresis.  08/20 follow up radiograph with improvement in bilateral pleural effusions (per my interpretation).  08/21 improved volume status, plan to discharge home and have close follow up as outpatient.   Assessment and Plan: * Acute on chronic diastolic CHF (congestive heart failure) (HCC) Echocardiogram with preserved LV systolic function with EF 55 to 60%, mild asymmetric left ventricular hypertrophy of the basal septal segment. RV systolic function preserved, no significant valvular disease.   Patient was placed on IV furosemide  for diuresis, negative fluid balance was achieved, - 8,363 ml with significant improvement in his symptoms.   Guideline directed medical therapy with spironolactone  and losartan . No B blocker due to risk of hypotension.  Possible addition of SGLT 2 inh as outpatient.  Patient will continue diuresis with furosemide  at home, 40 mg daily.   Acute cardiogenic pulmonary edema  with bilateral pleural effusions, more right than left.  Follow up chest radiograph with improvement in right pleural effusions.  Continue diuresis.   Paroxysmal A-fib (HCC) Rate controlled Continue anticoagulation with apixaban    History of CAD  (coronary artery disease) Continue blood pressure monitoring  No acute coronary syndrome   Type 2 diabetes mellitus with hyperlipidemia (HCC) Patient was placed on insulin  sliding scale and basal insulin  for glucose cover and monitoring.  At the time of discharge will resume his diabetic regimen with sitagliptin  and glipizide .   Continue statin therapy   Sacral decubitus ulcer Continue wound care  GAD (generalized anxiety disorder) Continue with mirtazapine  paroxetine , and quetiapine .    Hx of iron  deficiency anemia Patient had iron  sucrose 300 mg on 08/19 Plan to follow up iron  stores as outpatient       Consultants: none  Procedures performed: none   Disposition: Home Diet recommendation:  Cardiac diet DISCHARGE MEDICATION: Allergies as of 08/22/2023       Reactions   Other Hives, Itching   Wool fabric - hives, itching        Medication List     STOP taking these medications    amLODipine  2.5 MG tablet Commonly known as: NORVASC    Gvoke HypoPen  2-Pack 1 MG/0.2ML Soaj Generic drug: Glucagon        TAKE these medications    acetaminophen  500 MG tablet Commonly known as: TYLENOL  Take 500 mg by mouth as needed for mild pain or moderate pain.   atorvastatin  40 MG tablet Commonly known as: LIPITOR TAKE 1 TABLET BY MOUTH EVERY DAY What changed: when to take this   clobetasol  0.05 % external solution Commonly known as: TEMOVATE  Apply 1 Application topically 2 (two) times daily. For scalp psoriasis. What changed:  when to take this reasons to take this additional instructions   Dexcom G6 Transmitter Misc USE AS INSTRUCTED TO CHECK BLOOD SUGAR.   Eliquis  5 MG Tabs tablet Generic drug: apixaban  TAKE 1 TABLET BY MOUTH TWICE A DAY   furosemide  40 MG tablet Commonly known as: LASIX  Take 1 tablet (40 mg total) by mouth daily. Start taking on: August 23, 2023 What changed:  medication strength how much to take   glipiZIDE  5 MG tablet Commonly  known as: GLUCOTROL  Take 1 tablet (5 mg total) by mouth 2 (two) times daily before a meal. What changed: when to take this   ketoconazole  2 % shampoo Commonly known as: NIZORAL  Apply 1 Application topically 2 (two) times a week. What changed:  when to take this reasons to take this   loperamide  2 MG capsule Commonly known as: IMODIUM  Take 1 capsule (2 mg total) by mouth 4 (four) times daily as needed for diarrhea or loose stools.   loratadine  10 MG tablet Commonly known as: CLARITIN  TAKE 1 TABLET BY MOUTH EVERY DAY What changed:  when to take this reasons to take this   losartan  25 MG tablet Commonly known as: COZAAR  Take 1 tablet (25 mg total) by mouth daily. Start taking on: August 23, 2023   mirtazapine  15 MG tablet Commonly known as: REMERON  TAKE 1 TABLET BY MOUTH EVERYDAY AT BEDTIME What changed: See the new instructions.   ofloxacin  0.3 % ophthalmic solution Commonly known as: OCUFLOX  Place 1 drop into the right eye 4 (four) times daily.   OVER THE COUNTER MEDICATION Take 500 mg by mouth daily. Optimal Carnivore - grass fed organ complex, 500mg .   PARoxetine  20 MG tablet Commonly known as: PAXIL  TAKE  1 TABLET ONCE A DAY FOR DEPRESSION   prednisoLONE  acetate 1 % ophthalmic suspension Commonly known as: PRED FORTE  Place 1 drop into the right eye 4 (four) times daily.   QUEtiapine  25 MG tablet Commonly known as: SEROQUEL  GIVE 1 TABLET BY MOUTH AT BEDTIME FOR DELIRIUM What changed: See the new instructions.   sitaGLIPtin  100 MG tablet Commonly known as: Januvia  Take 1 tablet (100 mg total) by mouth daily.   spironolactone  25 MG tablet Commonly known as: ALDACTONE  Take 1 tablet (25 mg total) by mouth daily.   Vitamin D3 50 MCG (2000 UT) capsule Take 2,000 Units by mouth daily.               Discharge Care Instructions  (From admission, onward)           Start     Ordered   08/22/23 0000  Discharge wound care:       Comments: Cleanse  sacral/buttocks wound with NS, apply Xeroform gauze Soila [phone]) to wound bed daily and secure with silicone foam.   2.Cleanse L dorsal foot wound with NS, apply Xeroform Soila [phone]) to entire area daily and secure with silicone foam or Kerlix roll gauze whichever is preferred   08/22/23 1303            Follow-up Information     [facility]. Go in 5 day(s).   Specialty: Cardiology Why: Hospital follow up 08/26/2023 @ 10:30 am PLEASE bring a current medication list to appointment FREE valet parking, Entrance [facility] [employer]   [facility] [employer]. Contact information: [address] [place]  [address] [phone]        [facility].   Why: Agency will call you to set up apt times Contact information: [address] [phone]               Discharge Exam: Filed Weights   08/20/23 0722 08/21/23 0358 08/22/23 0341  Weight: 71.4 kg 69.1 kg 63.2 kg   BP (!) 143/80 (BP Location: Right Arm)   Pulse (!) 50   Temp 98.6 F (37 C) (Oral)   Resp 18   Ht 6' 2 (1.88 m)   Wt 63.2 kg   SpO2 97%   BMI 17.89 kg/m   Patient is feeling well, no chest pain and no dyspnea, no peripheral edema, no PND or orthopnea.   Neurology awake and alert and awake  ENT with mild pallor Cardiovascular with S1 and S2 present, irregularly irregular with no gallops, rubs or murmurs Respiratory with mild decreased breath sounds at bases with no wheezing or rhonchi  Abdomen with no distention, soft and non tender No  lower extremity edema   Condition at discharge: stable  The results of significant diagnostics from this hospitalization (including imaging, microbiology, ancillary and laboratory) are listed below for reference.   Imaging Studies: DG Chest 1 View Result Date: 08/21/2023 CLINICAL DATA:  Pleural effusion. EXAM: CHEST  1 VIEW COMPARISON:  August 18, 2023.  FINDINGS: Stable cardiomediastinal silhouette. Status post coronary bypass graft. Stable bilateral pleural effusions are noted with associated bibasilar atelectasis or infiltrates, right greater than left. Bony thorax is unremarkable. IMPRESSION: Stable bilateral pleural effusions with associated bibasilar atelectasis or infiltrates, right greater than left. Electronically Signed   By: Lynwood Landy Raddle M.D.   On: 08/21/2023 15:51   ECHOCARDIOGRAM COMPLETE Result Date: 08/19/2023    ECHOCARDIOGRAM REPORT  Patient Name:   [name] [name] Date of Exam: 08/19/2023 Medical Rec #:  [id number]            Height:       74.0 in Accession #:    [id number]           Weight:       160.5 lb Date of Birth:  Nov 18, 1955             BSA:          1.979 m Patient Age:    68 years             BP:           158/71 mmHg Patient Gender: M                    HR:           52 bpm. Exam Location:  Inpatient Procedure: 2D Echo, Cardiac Doppler and Color Doppler (Both Spectral and Color            Flow Doppler were utilized during procedure). Indications:    Congestive Heart Failure I50.9  History:        Patient has prior history of Echocardiogram examinations, most                 recent 02/02/2023. CHF, CAD, Prior CABG, Arrythmias:Atrial                 Fibrillation; Risk Factors:Current Smoker, Hypertension,                 Diabetes and Dyslipidemia.  Sonographer:    Koleen Popper RDCS Referring Phys: [id number] SUBRINA SUNDIL IMPRESSIONS  1. Left ventricular ejection fraction, by estimation, is 55 to 60%. The left ventricle has normal function. The left ventricle has no regional wall motion abnormalities. There is mild asymmetric left ventricular hypertrophy of the basal-septal segment. Left ventricular diastolic parameters are consistent with Grade II diastolic dysfunction (pseudonormalization).  2. Right ventricular systolic function is normal. The right ventricular size is normal. Tricuspid regurgitation signal is inadequate for  assessing PA pressure.  3. The mitral valve is normal in structure. Trivial mitral valve regurgitation. No evidence of mitral stenosis.  4. The aortic valve is tricuspid. There is moderate calcification of the aortic valve. Aortic valve regurgitation is not visualized. Aortic valve sclerosis/calcification is present, without any evidence of aortic stenosis.  5. The inferior vena cava is dilated in size with <50% respiratory variability, suggesting right atrial pressure of 15 mmHg.  6. Left pleural effusion noted. FINDINGS  Left Ventricle: Left ventricular ejection fraction, by estimation, is 55 to 60%. The left ventricle has normal function. The left ventricle has no regional wall motion abnormalities. The left ventricular internal cavity size was normal in size. There is  mild asymmetric left ventricular hypertrophy of the basal-septal segment. Left ventricular diastolic parameters are consistent with Grade II diastolic dysfunction (pseudonormalization). Right Ventricle: The right ventricular size is normal. No increase in right ventricular wall thickness. Right ventricular systolic function is normal. Tricuspid regurgitation signal is inadequate for assessing PA pressure. Left Atrium: Left atrial size was normal in size. Right Atrium: Right atrial size was normal in size. Pericardium: Left pleural effusion noted. There is no evidence of pericardial effusion. Mitral Valve: The mitral valve is normal in structure. There is mild calcification of the mitral valve leaflet(s). Trivial mitral valve regurgitation. No evidence of mitral valve stenosis. Tricuspid Valve: The tricuspid valve is  normal in structure. Tricuspid valve regurgitation is not demonstrated. Aortic Valve: The aortic valve is tricuspid. There is moderate calcification of the aortic valve. Aortic valve regurgitation is not visualized. Aortic valve sclerosis/calcification is present, without any evidence of aortic stenosis. Pulmonic Valve: The pulmonic  valve was normal in structure. Pulmonic valve regurgitation is trivial. Aorta: The aortic root is normal in size and structure. Venous: The inferior vena cava is dilated in size with less than 50% respiratory variability, suggesting right atrial pressure of 15 mmHg. IAS/Shunts: No atrial level shunt detected by color flow Doppler.  LEFT VENTRICLE PLAX 2D LVIDd:         5.10 cm LVIDs:         3.80 cm LV PW:         1.20 cm LV IVS:        1.30 cm LVOT diam:     2.40 cm LV SV:         104 LV SV Index:   52 LVOT Area:     4.52 cm  RIGHT VENTRICLE         IVC TAPSE (M-mode): 1.2 cm  IVC diam: 2.30 cm LEFT ATRIUM             Index LA diam:        3.90 cm 1.97 cm/m LA Vol (A2C):   38.9 ml 19.65 ml/m LA Vol (A4C):   43.2 ml 21.82 ml/m LA Biplane Vol: 42.2 ml 21.32 ml/m  AORTIC VALVE LVOT Vmax:   95.40 cm/s LVOT Vmean:  63.900 cm/s LVOT VTI:    0.229 m  AORTA Ao Root diam: 3.50 cm Ao Asc diam:  3.70 cm MITRAL VALVE MV Area (PHT): 2.69 cm     SHUNTS MV Decel Time: 282 msec     Systemic VTI:  0.23 m MV E velocity: 107.00 cm/s  Systemic Diam: 2.40 cm MV A velocity: 21.50 cm/s MV E/A ratio:  4.98 Dalton McleanMD Electronically signed by Ezra Kanner Signature Date/Time: 08/19/2023/7:06:44 PM    Final    CT CHEST WO CONTRAST Result Date: 08/18/2023 EXAM: CT CHEST WITHOUT CONTRAST 08/18/2023 09:18:51 PM TECHNIQUE: CT of the chest was performed without the administration of intravenous contrast. Multiplanar reformatted images are provided for review. Automated exposure control, iterative reconstruction, and/or weight based adjustment of the mA/kV was utilized to reduce the radiation dose to as low as reasonably achievable. COMPARISON: None available. CLINICAL HISTORY: Bilateral pleural effusion and pneumonia. FINDINGS: MEDIASTINUM AND LYMPH NODES: Postsurgical changes in the mediastinum. Calcific aortic atherosclerosis. Coronary artery calcifications. LUNGS AND PLEURA: Right greater than left intermediate size pleural  effusions. Bibasilar atelectasis. SOFT TISSUES/BONES: No acute abnormality of the bones or soft tissues. UPPER ABDOMEN: Limited images of the upper abdomen demonstrates no acute abnormality. IMPRESSION: 1. Right greater than left intermediate size pleural effusions with associated bibasilar atelectasis. 2. No acute findings of pneumonia. Electronically signed by: Franky Stanford MD 08/18/2023 11:28 PM EDT RP Workstation: [id number]   DG Chest Portable 1 View Result Date: 08/18/2023 CLINICAL DATA:  Short of breath, CHF EXAM: PORTABLE CHEST 1 VIEW COMPARISON:  03/01/2023 FINDINGS: Single frontal view of the chest demonstrates postsurgical changes from CABG. Cardiac silhouette is stable. Persistent bibasilar consolidation and effusions, right greater than left, not appreciably changed since prior exam. Central pulmonary vascular congestion unchanged. No pneumothorax. IMPRESSION: 1. Stable pulmonary vascular congestion, bibasilar consolidation, and bilateral pleural effusions. Electronically Signed   By: Ozell Daring M.D.   On: 08/18/2023 16:21  NM Bone Scan Whole Body Result Date: 08/02/2023 CLINICAL DATA:  Unintended weight loss, elevated alkaline phosphatase EXAM: NUCLEAR MEDICINE WHOLE BODY BONE SCAN TECHNIQUE: Whole body anterior and posterior images were obtained approximately 3 hours after intravenous injection of radiopharmaceutical. RADIOPHARMACEUTICALS:  20.8 mCi Technetium-54m MDP IV COMPARISON:  CT chest 04/12/2023 FINDINGS: Anterior and posterior whole body planar images are obtained after radiotracer administration. There is physiologic excretion of radiotracer within the kidneys and bladder. Mild degenerative type activity is seen within the bilateral shoulders, left knee, and right foot. There is no abnormal radiotracer uptake to suggest an acute or destructive process. IMPRESSION: 1. Mild degenerative type uptake involving the shoulders, left knee, and right foot. 2. Otherwise unremarkable bone  scan. Electronically Signed   By: Ozell Daring M.D.   On: 08/02/2023 19:46    Microbiology: Results for orders placed or performed during the hospital encounter of 08/18/23  MRSA Next Gen by PCR, Nasal     Status: Abnormal   Collection Time: 08/18/23 10:38 PM   Specimen: Nasal Mucosa; Nasal Swab  Result Value Ref Range Status   MRSA by PCR Next Gen DETECTED (A) NOT DETECTED Final    Comment: RESULT CALLED TO, READ BACK BY AND VERIFIED WITH: AMIREHSANI RN 08/19/2023 @ 0001 BY AB (NOTE) The GeneXpert MRSA Assay (FDA approved for NASAL specimens only), is one component of a comprehensive MRSA colonization surveillance program. It is not intended to diagnose MRSA infection nor to guide or monitor treatment for MRSA infections. Test performance is not FDA approved in patients less than 44 years old. Performed at [facility], [address], [place], [place] [address]     Labs: CBC: Recent Labs  Lab 08/18/23 1610 08/19/23 0346 08/21/23 0839  WBC 5.5 5.9 6.9  NEUTROABS 3.9  --   --   HGB 9.8* 8.2* 10.4*  HCT 30.9* 25.6* 32.1*  MCV 87.0 86.5 84.7  PLT 214 173 229   Basic Metabolic Panel: Recent Labs  Lab 08/18/23 1610 08/19/23 0346 08/19/23 1052 08/21/23 0839 08/22/23 0958  NA 140 135  --  135 135  K 3.7 3.5  --  3.8 3.8  CL 102 105  --  96* 95*  CO2 25 25  --  26 28  GLUCOSE 194* 186*  --  99 169*  BUN 28* 27*  --  25* 28*  CREATININE 0.96 1.07  --  1.01 1.27*  CALCIUM  8.9 7.9*  --  8.6* 8.4*  MG  --   --  1.5*  --  1.8   Liver Function Tests: Recent Labs  Lab 08/18/23 1610 08/19/23 0346  AST 21 14*  ALT 12 12  ALKPHOS 169* 106  BILITOT 0.8 0.6  PROT 7.1 5.7*  ALBUMIN 3.4* 2.2*   CBG: Recent Labs  Lab 08/20/23 2112 08/21/23 0647 08/21/23 2100 08/22/23 0607 08/22/23 1035  GLUCAP 201* 100* 91 89 167*    Discharge time spent: greater than 30 minutes.  Signed: Elidia Toribio Furnace, MD [facility] 08/22/2023

## 2023-08-22 NOTE — Progress Notes (Addendum)
 Patient expressed verbal understanding of discharge plan of care. Tele and PIV removed. Cardio unit called.   VSS    Discharge lounge notified.  TOC pharm prior to lounge.

## 2023-08-22 NOTE — Progress Notes (Signed)
 Occupational Therapy Treatment Patient Details Name: [name] [name] MRN: [id number] DOB: 21-Nov-1955 Today's Date: 08/22/2023   History of present illness Pt is a 68 yr old male who presented 08/18/23 due to LE edema, wound HTN, and hallucinations. Pt admitted due to acute on chronic CHF. Pt's chest xray showed bilateral pleural effusion.  PMH: A-fib on Eliquis , CAD s/p CABG, PAD, diastolic CHF, CVA, DM2 on insulin  pump, HTN, HLD, depression, eye sx to R eye   OT comments  Pt at this time presented in bed and agreeable to go to chair for breakfast. Pt required min assist for bed mobility and CGA with RW to chair with cues. Pt at this time declining further mobility at this time. Pt was encourage to work with therapy/mobility as at risk for further deconditioning. At this time recommendation for [facility] as long as family can support with the return to home.      If plan is discharge home, recommend the following:  A little help with walking and/or transfers;A little help with bathing/dressing/bathroom;Assistance with cooking/housework;Direct supervision/assist for medications management;Direct supervision/assist for financial management;Assist for transportation   Equipment Recommendations  None recommended by OT    Recommendations for Other Services      Precautions / Restrictions Precautions Precautions: Fall Recall of Precautions/Restrictions: Intact Restrictions Weight Bearing Restrictions Per Provider Order: No       Mobility Bed Mobility Overal bed mobility: Needs Assistance Bed Mobility: Supine to Sit     Supine to sit: Min assist, Used rails, HOB elevated     General bed mobility comments: Pt needed increase in time to complete    Transfers Overall transfer level: Needs assistance Equipment used: Rolling walker (2 wheels) Transfers: Sit to/from Stand Sit to Stand: Contact guard assist                 Balance Overall balance assessment: Needs  assistance Sitting-balance support: Feet supported Sitting balance-Leahy Scale: Good     Standing balance support: Bilateral upper extremity supported Standing balance-Leahy Scale: Fair Standing balance comment: Pt reported they are only agreeable to go to chair at this time                           ADL either performed or assessed with clinical judgement   ADL Overall ADL's : Needs assistance/impaired Eating/Feeding: Set up;Sitting   Grooming: Wash/dry face;Set up;Sitting   Upper Body Bathing: Set up;Sitting   Lower Body Bathing: Contact guard assist;Sit to/from stand   Upper Body Dressing : Set up;Sitting   Lower Body Dressing: Contact guard assist;Sit to/from stand   Toilet Transfer: Contact guard assist;Rolling walker (2 wheels)   Toileting- Clothing Manipulation and Hygiene: Contact guard assist       Functional mobility during ADLs: Contact guard assist;Rolling walker (2 wheels)      Extremity/Trunk Assessment Upper Extremity Assessment Upper Extremity Assessment: Generalized weakness            Vision   Additional Comments: Pt is blind in L eye and recent eye sx in R eye and still reporting blurry vision   Perception     Praxis     Communication Communication Communication: No apparent difficulties   Cognition Arousal: Alert Behavior During Therapy: WFL for tasks assessed/performed                                 Following commands: Intact  Cueing   Cueing Techniques: Verbal cues  Exercises      Shoulder Instructions       General Comments      Pertinent Vitals/ Pain       Pain Assessment Pain Assessment: No/denies pain  Home Living                                          Prior Functioning/Environment              Frequency  Min 2X/week        Progress Toward Goals  OT Goals(current goals can now be found in the care plan section)  Progress towards OT goals:  Progressing toward goals  Acute Rehab OT Goals Patient Stated Goal: none OT Goal Formulation: With patient Time For Goal Achievement: 09/03/23 Potential to Achieve Goals: Good ADL Goals Pt Will Perform Upper Body Bathing: sitting Pt Will Perform Lower Body Bathing: with modified independence;sit to/from stand Pt Will Perform Upper Body Dressing: with modified independence Pt Will Perform Lower Body Dressing: with modified independence Pt Will Transfer to Toilet: with modified independence Pt Will Perform Tub/Shower Transfer: with modified independence;ambulating  Plan      Co-evaluation                 AM-PAC OT 6 Clicks Daily Activity     Outcome Measure   Help from another person eating meals?: A Little Help from another person taking care of personal grooming?: A Little Help from another person toileting, which includes using toliet, bedpan, or urinal?: A Little Help from another person bathing (including washing, rinsing, drying)?: A Little Help from another person to put on and taking off regular upper body clothing?: A Little Help from another person to put on and taking off regular lower body clothing?: A Little 6 Click Score: 18    End of Session Equipment Utilized During Treatment: Gait belt;Rolling walker (2 wheels)  OT Visit Diagnosis: Unsteadiness on feet (R26.81);Other abnormalities of gait and mobility (R26.89);Repeated falls (R29.6);Muscle weakness (generalized) (M62.81)   Activity Tolerance Patient tolerated treatment well   Patient Left in chair;with call bell/phone within reach;with chair alarm set   Nurse Communication Mobility status        Time: 0722-0743 OT Time Calculation (min): 21 min  Charges: OT General Charges $OT Visit: 1 Visit OT Treatments $Self Care/Home Management : 8-22 mins  Warrick POUR OTR/L  Acute Rehab Services  [phone] office number   Warrick Berber 08/22/2023, 7:51 AM

## 2023-08-23 ENCOUNTER — Telehealth: Payer: Self-pay | Admitting: *Deleted

## 2023-08-23 NOTE — Transitions of Care (Post Inpatient/ED Visit) (Signed)
 08/23/2023  Name: [name] [name] MRN: [id number] DOB: 1955-12-25  Today's TOC FU Call Status: Today's TOC FU Call Status:: Successful TOC FU Call Completed TOC FU Call Complete Date: 08/23/23 Patient's Name and Date of Birth confirmed.  Transition Care Management Follow-up Telephone Call Date of Discharge: 08/22/23 Discharge Facility: [name] Type of Discharge: Inpatient Admission Primary Inpatient Discharge Diagnosis:: Acute on chronic diastolic CHF (congestive heart failure) How have you been since you were released from the hospital?:  (patient's son states pt seems to be feeling better, ambulating with walker, son to assist with wound care) Any questions or concerns?: No  Items Reviewed: Did you receive and understand the discharge instructions provided?: Yes Medications obtained,verified, and reconciled?: Yes (Medications Reviewed) Any new allergies since your discharge?: No Dietary orders reviewed?: Yes Type of Diet Ordered:: heart healthy   low sodium Do you have support at home?: Yes People in Home [RPT]: alone Name of Support/Comfort Primary Source: adult son [name]  - visits pt BID Enrolled in TOC 30 day program with consent provided by patient's son  Medications Reviewed Today: Medications Reviewed Today     Reviewed by Aura Mliss LABOR, RN (Registered Nurse) on 08/23/23 at 1008  Med List Status: <None>   Medication Order Taking? Sig Documenting Provider Last Dose Status Informant  acetaminophen  (TYLENOL ) 500 MG tablet [id number] Yes Take 500 mg by mouth as needed for mild pain or moderate pain. [provider]  Active Child, Pharmacy Records  atorvastatin  (LIPITOR) 40 MG tablet [id number] Yes TAKE 1 TABLET BY MOUTH EVERY DAY [name], [name], PA-C  Active Child, Pharmacy Records  Cholecalciferol  (VITAMIN D3) 50 MCG (2000 UT) capsule [id number] Yes Take 2,000 Units by mouth daily. [provider]  Active Child, Pharmacy Records  clobetasol   (TEMOVATE ) 0.05 % external solution [id number] Yes Apply 1 Application topically 2 (two) times daily. For scalp psoriasis. [name], Mardy HERO, PA-C  Active Child, Pharmacy Records  Continuous Glucose Transmitter (DEXCOM G6 TRANSMITTER) MISC [id number] Yes USE AS INSTRUCTED TO CHECK BLOOD SUGAR. Shamleffer, Donell Cardinal, MD  Active Child, Pharmacy Records  ELIQUIS  5 MG TABS tablet [id number] Yes TAKE 1 TABLET BY MOUTH TWICE A DAY O'Neal, Darryle Ned, MD  Active Child, Pharmacy Records  furosemide  (LASIX ) 40 MG tablet [id number] Yes Take 1 tablet (40 mg total) by mouth daily. Arrien, Elidia Sieving, MD  Active   glipiZIDE  (GLUCOTROL ) 5 MG tablet [id number] Yes Take 1 tablet (5 mg total) by mouth 2 (two) times daily before a meal. Shamleffer, Donell Cardinal, MD  Active   ketoconazole  (NIZORAL ) 2 % shampoo [id number] Yes Apply 1 Application topically 2 (two) times a week. [name], Mardy HERO, PA-C  Active Child, Pharmacy Records  loperamide  (IMODIUM ) 2 MG capsule [id number] Yes Take 1 capsule (2 mg total) by mouth 4 (four) times daily as needed for diarrhea or loose stools. Wendolyn Jenkins Jansky, MD  Active Child, Pharmacy Records  loratadine  (CLARITIN ) 10 MG tablet [id number] Yes TAKE 1 TABLET BY MOUTH EVERY DAY [name], [name], PA-C  Active Child, Pharmacy Records  losartan  (COZAAR ) 25 MG tablet [id number] Yes Take 1 tablet (25 mg total) by mouth daily. Arrien, Elidia Sieving, MD  Active   mirtazapine  (REMERON ) 15 MG tablet [id number] Yes TAKE 1 TABLET BY MOUTH EVERYDAY AT BEDTIME [name], Mardy HERO, PA-C  Active Child, Pharmacy Records  ofloxacin  (OCUFLOX ) 0.3 % ophthalmic solution [id number] Yes Place 1 drop into the right eye 4 (four) times daily. [provider]  Active Child,  Pharmacy Records           Med Note (COFFELL, JON HERO   Mon Aug 19, 2023 11:27 AM)    OVER THE COUNTER MEDICATION [id number]  Take 500 mg by mouth daily. Optimal Carnivore - grass fed organ complex, 500mg . [provider]  Active Child, Pharmacy Records  PARoxetine  (PAXIL ) 20 MG tablet [id number] Yes TAKE 1 TABLET ONCE A DAY FOR DEPRESSION [name], [name], PA-C  Active Child, Pharmacy Records  prednisoLONE  acetate (PRED FORTE ) 1 % ophthalmic suspension [id number] Yes Place 1 drop into the right eye 4 (four) times daily. [provider]  Active Child, Pharmacy Records           Med Note (COFFELL, JON HERO Kitchens Aug 19, 2023 11:28 AM)    QUEtiapine  (SEROQUEL ) 25 MG tablet [id number] Yes GIVE 1 TABLET BY MOUTH AT BEDTIME FOR DELIRIUM [name], [name], PA-C  Active Child, Pharmacy Records  sitaGLIPtin  (JANUVIA ) 100 MG tablet [id number] Yes Take 1 tablet (100 mg total) by mouth daily. Shamleffer, Donell Cardinal, MD  Active Child, Pharmacy Records  spironolactone  (ALDACTONE ) 25 MG tablet [id number] Yes Take 1 tablet (25 mg total) by mouth daily. Barbaraann Darryle Ned, MD  Active Child, Pharmacy Records            Home Care and Equipment/Supplies: Were Home Health Services Ordered?: Yes Name of Home Health Agency:: [facility] Has Agency set up a time to come to your home?: Yes First Home Health Visit Date: 08/23/23 Any new equipment or medical supplies ordered?: No  Functional Questionnaire: Do you need assistance with bathing/showering or dressing?: Yes Do you need assistance with meal preparation?: Yes (son assists) Do you need assistance with eating?: No Do you have difficulty maintaining continence: No Do you need assistance with getting out of bed/getting out of a chair/moving?: Yes (uses rollator walker) Do you have difficulty managing or taking your medications?:  (son provides oversight)  Follow up appointments reviewed: PCP Follow-up appointment confirmed?: Yes Date of PCP follow-up appointment?: 08/29/23 Follow-up Provider: Alyssa [name]  @ 10 am Specialist Hospital Follow-up appointment confirmed?: Yes Date of Specialist follow-up appointment?: 08/26/23 Follow-Up  Specialty Provider:: cardiologist Do you need transportation to your follow-up appointment?: No Do you understand care options if your condition(s) worsen?: Yes-patient verbalized understanding  SDOH Interventions Today    Flowsheet Row Most Recent Value  SDOH Interventions   Food Insecurity Interventions Intervention Not Indicated  Housing Interventions Intervention Not Indicated  Transportation Interventions Intervention Not Indicated  Utilities Interventions Intervention Not Indicated    Goals Addressed             This Visit's Progress    VBCI Transitions of Care (TOC) Care Plan       Problems:  Recent Hospitalization for treatment of CHF Knowledge Deficit Related to management of CHF Pt lives alone, son checks in on pt BID, provides oversight for all care, pt does not weigh daily and can be safety issue for pt to stand on the scales, CBG monitored with Dexcom G6, son does not know current readings,  son will assist with daily wound care to sacrum and left dorsal foot with oversight provided by [facility] home health.  Pt not interested in another level of care at present, son has consulted with elder care lawyer but pt not ready for any other level of care at present and is able to make his own decisions at this time, pt does not qualify for medicaid per son,  wound only qualify for long term medicaid for SNF.  Goal:  Over the next 30 days, the patient will not experience hospital readmission  Interventions:  Heart Failure Interventions: Basic overview and discussion of pathophysiology of Heart Failure reviewed Provided education on low sodium diet Assessed need for readable accurate scales in home Provided education about placing scale on hard, flat surface Advised patient to weigh each morning after emptying bladder Discussed importance of daily weight and advised patient to weigh and record daily Discussed the importance of keeping all appointments with provider Assessed  social determinant of health barriers  Reviewed Heart Failure action plan Reviewed wound care from discharge summary with patient's son Reviewed signs/ symptoms of infection Reviewed carbohydrate modified diet, importance of good CBG control to promote wound heaing Reviewed all upcoming scheduled appointments Reviewed safety precautions, importance of always using walker  Patient Self Care Activities:  Attend all scheduled provider appointments Call pharmacy for medication refills 3-7 days in advance of running out of medications Call provider office for new concerns or questions  Notify RN Care Manager of TOC call rescheduling needs Participate in Transition of Care Program/Attend TOC scheduled calls Take medications as prescribed   call office if I gain more than 2 pounds in one day or 5 pounds in one week keep legs up while sitting track weight in diary use salt in moderation watch for swelling in feet, ankles and legs every day weigh myself daily develop a rescue plan follow rescue plan if symptoms flare-up eat more whole grains, fruits and vegetables, lean meats and healthy fats track symptoms and what helps feel better or worse Report any signs/ symptoms of infection to wounds immediately Fall prevention strategies: change positions slowly, use a walker or cane (per provider recommendations) when walking, keep walkways clear, have good lighting in room. It is important to contact your provider if you have any falls. Maintain muscle strength/tone by exercise per provider recommendations.   Plan:  Telephone follow up appointment with care management team member scheduled for:  08/30/23 @ 1045 am The patient has been provided with contact information for the care management team and has been advised to call with any health related questions or concerns.         Mliss [facility], BSN RN Care Manager/ Transition of Care / [facility] [phone]

## 2023-08-25 ENCOUNTER — Other Ambulatory Visit: Payer: Self-pay | Admitting: Physician Assistant

## 2023-08-25 DIAGNOSIS — F3341 Major depressive disorder, recurrent, in partial remission: Secondary | ICD-10-CM

## 2023-08-25 DIAGNOSIS — E785 Hyperlipidemia, unspecified: Secondary | ICD-10-CM

## 2023-08-26 ENCOUNTER — Other Ambulatory Visit (HOSPITAL_COMMUNITY): Payer: Self-pay

## 2023-08-26 ENCOUNTER — Ambulatory Visit (HOSPITAL_COMMUNITY)
Admit: 2023-08-26 | Discharge: 2023-08-26 | Disposition: A | Discharge status: Home / Self Care | Source: Ambulatory Visit | Attending: Physician Assistant | Admitting: Physician Assistant

## 2023-08-26 ENCOUNTER — Telehealth: Payer: Self-pay | Admitting: Nurse Practitioner

## 2023-08-26 ENCOUNTER — Encounter (HOSPITAL_COMMUNITY): Payer: Self-pay

## 2023-08-26 ENCOUNTER — Ambulatory Visit (HOSPITAL_COMMUNITY): Payer: Self-pay | Admitting: Physician Assistant

## 2023-08-26 VITALS — BP 130/80 | HR 58 | Ht 74.0 in | Wt 148.8 lb

## 2023-08-26 DIAGNOSIS — I482 Chronic atrial fibrillation, unspecified: Secondary | ICD-10-CM | POA: Diagnosis not present

## 2023-08-26 DIAGNOSIS — Z91148 Patient's other noncompliance with medication regimen for other reason: Secondary | ICD-10-CM | POA: Insufficient documentation

## 2023-08-26 DIAGNOSIS — E1142 Type 2 diabetes mellitus with diabetic polyneuropathy: Secondary | ICD-10-CM | POA: Insufficient documentation

## 2023-08-26 DIAGNOSIS — I2510 Atherosclerotic heart disease of native coronary artery without angina pectoris: Secondary | ICD-10-CM | POA: Diagnosis not present

## 2023-08-26 DIAGNOSIS — D509 Iron deficiency anemia, unspecified: Secondary | ICD-10-CM | POA: Insufficient documentation

## 2023-08-26 DIAGNOSIS — I110 Hypertensive heart disease with heart failure: Secondary | ICD-10-CM | POA: Diagnosis not present

## 2023-08-26 DIAGNOSIS — Z951 Presence of aortocoronary bypass graft: Secondary | ICD-10-CM | POA: Insufficient documentation

## 2023-08-26 DIAGNOSIS — Z7984 Long term (current) use of oral hypoglycemic drugs: Secondary | ICD-10-CM | POA: Insufficient documentation

## 2023-08-26 DIAGNOSIS — Z8673 Personal history of transient ischemic attack (TIA), and cerebral infarction without residual deficits: Secondary | ICD-10-CM | POA: Insufficient documentation

## 2023-08-26 DIAGNOSIS — Z7901 Long term (current) use of anticoagulants: Secondary | ICD-10-CM | POA: Diagnosis not present

## 2023-08-26 DIAGNOSIS — F17290 Nicotine dependence, other tobacco product, uncomplicated: Secondary | ICD-10-CM | POA: Diagnosis not present

## 2023-08-26 DIAGNOSIS — Z79899 Other long term (current) drug therapy: Secondary | ICD-10-CM | POA: Diagnosis not present

## 2023-08-26 DIAGNOSIS — I5032 Chronic diastolic (congestive) heart failure: Secondary | ICD-10-CM

## 2023-08-26 DIAGNOSIS — J90 Pleural effusion, not elsewhere classified: Secondary | ICD-10-CM | POA: Diagnosis not present

## 2023-08-26 LAB — COMPREHENSIVE METABOLIC PANEL WITH GFR
ALT: 16 U/L (ref 0–44)
AST: 23 U/L (ref 15–41)
Albumin: 2.7 g/dL — ABNORMAL LOW (ref 3.5–5.0)
Alkaline Phosphatase: 131 U/L — ABNORMAL HIGH (ref 38–126)
Anion gap: 7 (ref 5–15)
BUN: 52 mg/dL — ABNORMAL HIGH (ref 8–23)
CO2: 28 mmol/L (ref 22–32)
Calcium: 8.7 mg/dL — ABNORMAL LOW (ref 8.9–10.3)
Chloride: 96 mmol/L — ABNORMAL LOW (ref 98–111)
Creatinine, Ser: 1.2 mg/dL (ref 0.61–1.24)
GFR, Estimated: >60 mL/min (ref >60)
Glucose, Bld: 197 mg/dL — ABNORMAL HIGH (ref 70–99)
Potassium: 4.8 mmol/L (ref 3.5–5.1)
Sodium: 131 mmol/L — ABNORMAL LOW (ref 135–145)
Total Bilirubin: 0.7 mg/dL (ref 0.0–1.2)
Total Protein: 7 g/dL (ref 6.5–8.1)

## 2023-08-26 LAB — BRAIN NATRIURETIC PEPTIDE: B Natriuretic Peptide: 190.6 pg/mL — ABNORMAL HIGH (ref 0.0–100.0)

## 2023-08-26 MED ORDER — FUROSEMIDE 40 MG PO TABS: ORAL_TABLET | ORAL | 2 refills | Status: DC

## 2023-08-26 NOTE — Progress Notes (Addendum)
 HEART & VASCULAR TRANSITION OF CARE CONSULT NOTE     Referring Physician: Dr. Noralee PCP: [name], Mardy HERO, PA-C  Cardiologist: Dr. Barbaraann  Chief Complaint: HFpEF  HPI: Referred to clinic by Dr. Arrien with [facility] for heart failure consultation.   [name] [name] is a 68 y.o. male with history of CAD s/p CABG X 4 (LIMA to LAD, RIMA to distal RCA, left radial to LCX, reverse SVG to D1) in 2003, HFpEF, report of aortic aneurysm repair in 2003 (no details available), PAD w/ history of endovascular interventions, DM, chronic atrial fibrillation, DM II, recurrent pleural effusion (seen by pulmonary and felt to be d/t CHF and hypoalbuminemia), hx CVA 11/24 d/t hypotension per Neuro (had also been noncompliant with Eliquis ), iron  deficiency anemia.   He was admitted in 08/25 with acute on chronic HFpEF, hypertensive urgency and concern for hallucinations per family. Patient had been eating a lot of salt. Diuresed with IV lasix . BP meds adjusted. Given IV iron  for iron  deficiency anemia. Echo during admit with LVEF 55-60%, grade II DD, RV okay  He is here today for post hospital CHF follow-up. Ambulated into the clinic with a walker. He is accompanied by his son who assists with the history. He has no complaints. Denies shortness of breath, orthopnea, PND and lower extremity edema. Has not weighed at home since discharge. Clinic weight up 9 lb from discharge weight (but has on clothes and shoes). He has lost about 20 lb over the last year. His son noticed significant change to his appetite last fall. PO intake a little better recently. Has diagnosis of peripheral neuropathy in chart but denies significant symptoms. No hx of carpal tunnel syndrome. His son organizes his medications for him and helps with meals. He has HH PT/OT and RN arranged. Had an aid to assist with ADLs in the past.    Past Medical History:  Diagnosis Date   Anxiety    Arthritis    Cataract    CHF (congestive heart  failure) (HCC)    Constipation    pt states goes EOD- hard stools    Coronary artery disease    Depression    Diabetes mellitus without complication (HCC)    Diabetic retinopathy (HCC)    DKA (diabetic ketoacidoses)    Hyperlipidemia    Hypertension    off meds   Hypertensive retinopathy    Lesion of ulnar nerve, left upper limb 07/12/2017   Non-intractable vomiting without nausea 06/10/2018   Postherpetic neuralgia 08/11/2021   Speculative diagnosis   Psoriasis    Tubular adenoma of colon 03/2019    Current Outpatient Medications  Medication Sig Dispense Refill   acetaminophen  (TYLENOL ) 500 MG tablet Take 500 mg by mouth as needed for mild pain or moderate pain.     atorvastatin  (LIPITOR) 40 MG tablet TAKE 1 TABLET BY MOUTH EVERY DAY 90 tablet 1   Cholecalciferol  (VITAMIN D3) 50 MCG (2000 UT) capsule Take 2,000 Units by mouth daily.     clobetasol  (TEMOVATE ) 0.05 % external solution Apply 1 Application topically 2 (two) times daily. For scalp psoriasis. 50 mL 0   Continuous Glucose Transmitter (DEXCOM G6 TRANSMITTER) MISC USE AS INSTRUCTED TO CHECK BLOOD SUGAR. 1 each 1   ELIQUIS  5 MG TABS tablet TAKE 1 TABLET BY MOUTH TWICE A DAY 180 tablet 1   furosemide  (LASIX ) 40 MG tablet Take 1 tablet (40 mg total) by mouth daily. 30 tablet 0   glipiZIDE  (GLUCOTROL ) 5 MG  tablet Take 1 tablet (5 mg total) by mouth 2 (two) times daily before a meal. 180 tablet 3   ketoconazole  (NIZORAL ) 2 % shampoo Apply 1 Application topically 2 (two) times a week. 120 mL 0   loperamide  (IMODIUM ) 2 MG capsule Take 1 capsule (2 mg total) by mouth 4 (four) times daily as needed for diarrhea or loose stools. 30 capsule 1   loratadine  (CLARITIN ) 10 MG tablet TAKE 1 TABLET BY MOUTH EVERY DAY 90 tablet 1   losartan  (COZAAR ) 25 MG tablet Take 1 tablet (25 mg total) by mouth daily. 30 tablet 0   mirtazapine  (REMERON ) 15 MG tablet TAKE 1 TABLET BY MOUTH EVERYDAY AT BEDTIME 90 tablet 1   ofloxacin  (OCUFLOX ) 0.3 %  ophthalmic solution Place 1 drop into the right eye 4 (four) times daily.     OVER THE COUNTER MEDICATION Take 500 mg by mouth daily. Optimal Carnivore - grass fed organ complex, 500mg .     PARoxetine  (PAXIL ) 20 MG tablet TAKE 1 TABLET ONCE A DAY FOR DEPRESSION 90 tablet 1   prednisoLONE  acetate (PRED FORTE ) 1 % ophthalmic suspension Place 1 drop into the right eye 4 (four) times daily.     QUEtiapine  (SEROQUEL ) 25 MG tablet GIVE 1 TABLET BY MOUTH AT BEDTIME FOR DELIRIUM 90 tablet 0   sitaGLIPtin  (JANUVIA ) 100 MG tablet Take 1 tablet (100 mg total) by mouth daily. 90 tablet 3   spironolactone  (ALDACTONE ) 25 MG tablet Take 1 tablet (25 mg total) by mouth daily. 90 tablet 3   No current facility-administered medications for this encounter.    Allergies  Allergen Reactions   Other Hives and Itching    Wool fabric - hives, itching      Social History   Socioeconomic History   Marital status: Divorced    Spouse name: Not on file   Number of children: 4   Years of education: 12   Highest education level: 12th grade  Occupational History   Occupation: Retired  Tobacco Use   Smoking status: Some Days    Types: Cigars    Last attempt to quit: 2022    Years since quitting: 3.6   Smokeless tobacco: Never   Tobacco comments:    1-2 per week cigars  05/09/2023  Vaping Use   Vaping status: Never Used  Substance and Sexual Activity   Alcohol use: Not Currently    Comment: about once a year   Drug use: No   Sexual activity: Yes  Other Topics Concern   Not on file  Social History Narrative   Lives with his adult son [name].   Has 4 children, 2 sons- both live in [place], 2 daughters one lives in [place], [place]  and youngest daughter attends [employer].    Fiance lives in  [place], [place]   Social Drivers of Health   Financial Resource Strain: Low Risk  (08/19/2023)   Overall Financial Resource Strain (CARDIA)    Difficulty of Paying Living Expenses: Not hard at all   Food Insecurity: No Food Insecurity (08/23/2023)   Hunger Vital Sign    Worried About Running Out of Food in the Last Year: Never true    Ran Out of Food in the Last Year: Never true  Recent Concern: Food Insecurity - Food Insecurity Present (08/18/2023)   Hunger Vital Sign    Worried About Running Out of Food in the Last Year: Sometimes true    Ran Out of Food in the Last Year: Sometimes  true  Transportation Needs: No Transportation Needs (08/23/2023)   PRAPARE - Administrator, Civil Service (Medical): No    Lack of Transportation (Non-Medical): No  Physical Activity: Inactive (11/21/2022)   Exercise Vital Sign    Days of Exercise per Week: 0 days    Minutes of Exercise per Session: 0 min  Stress: Stress Concern Present (11/21/2022)   [employer] of Occupational Health - Occupational Stress Questionnaire    Feeling of Stress : Very much  Social Connections: Socially Isolated (08/18/2023)   Social Connection and Isolation Panel    Frequency of Communication with Friends and Family: Once a week    Frequency of Social Gatherings with Friends and Family: Once a week    Attends Religious Services: Never    Database administrator or Organizations: No    Attends Banker Meetings: Never    Marital Status: Divorced  Catering manager Violence: Not At Risk (08/23/2023)   Humiliation, Afraid, Rape, and Kick questionnaire    Fear of Current or Ex-Partner: No    Emotionally Abused: No    Physically Abused: No    Sexually Abused: No      Family History  Problem Relation Age of Onset   Lymphoma Father    Cancer Paternal Grandmother        Colon Cancer   Colon cancer Paternal Grandmother    Colon polyps Neg Hx    Esophageal cancer Neg Hx    Rectal cancer Neg Hx    Stomach cancer Neg Hx    Diabetes Mellitus I Neg Hx    Pancreatic cancer Neg Hx     Vitals:   08/26/23 1025  BP: 130/80  Pulse: (!) 58  SpO2: 96%  Weight: 67.5 kg (148 lb 12.8 oz)  Height:  6' 2 (1.88 m)    PHYSICAL EXAM: General:  Cachectic, chronically ill appearing Cor: JVP 6-7. Rhythm irregular. No murmurs. Lungs: clear Abdomen: soft, nontender, nondistended.  Extremities: trace edema Neuro: alert & oriented x 3. Affect flat  ECG: Afib 52 bpm, ? Pseudo-infarct pattern leads V1-V3   ASSESSMENT & PLAN: HFpEF -Echo 08/25 LVEF 55-60%, grade II DD, mild LVH, RV function okay w/ mildly increased wall thickness -Given combination of failure to thrive picture, HFpEF, Afib/bradycardia and possible pseudo-infarct pattern on ECG (although does have known CAD) need to rule out cardiac amyloidosis. Check myeloma panel, urine immunofixation, serum free light chains and PYP scan. -NYHA III, confounded by physical deconditioning -Volume okay on exam. Weight up from discharge but appetite seems somewhat better. Need to establish new baseline weight. Discussed regular weights with son. Check BMET/BNP today. Continue 40 mg lasix  daily.  -No SGLT2i with sacral decubitus ulcer -Continue losartan  25 mg daily -Continue spironolactone  25 mg daily  Chronic atrial fibrillation Bradycardia -Not on any AV nodal blockers with slow ventricular response -Continue Eliquis  5 mg BID  CAD -Hx CABG in 2003 -No aspirin  with anticoagulation -Continue statin -No chest pain  DM II -Uncontrolled. -Last A1c > 9 -No SGLT2i d/t wound  Iron  deficiency anemia -Received IV iron  during recent admit -Has upcoming colonoscopy   Referred to HFSW (PCP, Medications, Transportation, ETOH Abuse, Drug Abuse, Insurance, Financial ): Yes, SDOH screen  Refer to Pharmacy: No Refer to Home Health: No Refer to Advanced Heart Failure Clinic: No Refer to General Cardiology: Yes  Follow up  PRN, will refer to Advanced Heart Failure if workup suggestive of cardiac amyloidosis. Referred to Cardiology for sooner follow-up  to reassess volume status (sees Dr. Barbaraann in 11/23).

## 2023-08-26 NOTE — Patient Instructions (Addendum)
 Good to see you today!  Labs done today, your results will be available in MyChart, we will contact you for abnormal readings.  You have been ordered a PYP Scan.  This is done in the Radiology Department of [facility].  When you come for this test please plan to be there 2-3 hours.  You will be called to schedule the appointment  Please call cardiology office for an earlier appointment 3-4 weeks ( We have sent office a message regarding this)  Social worker will reach out to you

## 2023-08-26 NOTE — Telephone Encounter (Signed)
 Dr. Leigh, refer to office visit 06/06/2023.Numerous communications since then.  Cardiac clearance including Eliquis  hold was received 08/01/2023. See phone note 08/16/2023, patient was scheduled for EGD/colonoscopy at [facility] 10/15/2023. Since that communication, patient was admitted to [facility] 8/17 to 08/22/2023 due to acute on chronic diastolic CHF, hypertensive urgency and chronic bilateral pleural effusions. He also had altered mental status in setting of CHF exacerbation, patient had hallucinations which resolved.  Dr. Leigh, patient is high risk for endoscopic procedures. Please verify if you want an updated cardiac clearance with Dr. Vernice prior to proceeding with EGD and colonoscopy 10/15/2023 or if you if you prefer to cancel his EGD and colonoscopy and schedule patient for a follow up appointment with you?

## 2023-08-26 NOTE — Telephone Encounter (Signed)
-----   Message from [facility], [place] sent at 08/26/2023  1:40 PM EDT ----- Labs suggest he may be getting dehydrated. Would have him cut back lasix  from 40 mg daily to 40 mg alternating with 20 mg every other day. Please contact his son (he arranges patient's meds) ----- Message ----- From: Rebecka, Lab In [place] Sent: 08/26/2023  12:46 PM EDT To: Manuelita Nat Dutch, PA-C

## 2023-08-26 NOTE — Telephone Encounter (Signed)
 Dr. Barbaraann , patient's chart was reviewed for preoperative cardiac evaluation. He was recently admitted and seen in follow-up by Advanced Heart Failure clinic. They are asking for EGD/colonoscopy. Given stable BNP on 08/26/23 and EF 55-60, G2DD on TTE 8/18, go you agree he may proceed or do you recommend we await amyloid scan that is pending.  Procedure scheduled in-hospital.    Please route your response to p cv div preop.  Thank you, Rosaline EMERSON Bane, NP-C 08/26/2023, 4:11 PM

## 2023-08-26 NOTE — Telephone Encounter (Signed)
 Thanks Colleen for the update.  Looks like he was just seen by cardiology today.  Manuelita would you mind commenting on if you think we can proceed with procedures for this patient in October or do you think more time to make sure he is stable for it. His exams have already been delayed, he has IDA that we were going to evaluate. If you think he needs more time to recover from hospitalization please let us  know. Thank you

## 2023-08-26 NOTE — Telephone Encounter (Addendum)
 He was seen one time in our clinic for a transitions of care visit but is not followed by the heart failure team. Would have his primary cardiology team comment on cardiac risk.

## 2023-08-27 ENCOUNTER — Telehealth (HOSPITAL_COMMUNITY): Payer: Self-pay

## 2023-08-27 LAB — KAPPA/LAMBDA LIGHT CHAINS
Kappa free light chain: 72.2 mg/L — ABNORMAL HIGH (ref 3.3–19.4)
Kappa, lambda light chain ratio: 1.45 (ref 0.26–1.65)
Lambda free light chains: 49.7 mg/L — ABNORMAL HIGH (ref 5.7–26.3)

## 2023-08-27 LAB — HM DIABETES EYE EXAM

## 2023-08-27 NOTE — Telephone Encounter (Signed)
No precert required 

## 2023-08-27 NOTE — Telephone Encounter (Signed)
-----   Message from Olam ORN sent at 08/26/2023 11:19 AM EDT ----- Needs PA# or ordered AMYLOID please.

## 2023-08-28 ENCOUNTER — Inpatient Hospital Stay: Admitting: Physician Assistant

## 2023-08-28 ENCOUNTER — Telehealth (HOSPITAL_COMMUNITY): Payer: Self-pay | Admitting: Licensed Clinical Social Worker

## 2023-08-28 LAB — MULTIPLE MYELOMA PANEL, SERUM
Albumin SerPl Elph-Mcnc: 3 g/dL (ref 2.9–4.4)
Albumin/Glob SerPl: 0.8 (ref 0.7–1.7)
Alpha 1: 0.3 g/dL (ref 0.0–0.4)
Alpha2 Glob SerPl Elph-Mcnc: 1 g/dL (ref 0.4–1.0)
B-Globulin SerPl Elph-Mcnc: 1.2 g/dL (ref 0.7–1.3)
Gamma Glob SerPl Elph-Mcnc: 1.4 g/dL (ref 0.4–1.8)
Globulin, Total: 3.8 g/dL (ref 2.2–3.9)
IgA: 405 mg/dL (ref 61–437)
IgG (Immunoglobin G), Serum: 1575 mg/dL (ref 603–1613)
IgM (Immunoglobulin M), Srm: 43 mg/dL (ref 20–172)
Total Protein ELP: 6.8 g/dL (ref 6.0–8.5)

## 2023-08-28 NOTE — Telephone Encounter (Addendum)
   Patient Name: Dabid Godown  DOB: 04-22-1955 MRN: [id number]  Primary Cardiologist: Darryle ONEIDA Decent, MD  Chart reviewed as part of pre-operative protocol coverage. Given past medical history and time since last visit, based on ACC/AHA guidelines, Juel Malcolm Quast is at acceptable risk for the planned procedure without further cardiovascular testing.   The patient was advised that if he develops new symptoms prior to surgery to contact our office to arrange for a follow-up visit, and he verbalized understanding.  Per office protocol, patient can hold Eliquis  for 2 days prior to procedure.   I will route this recommendation to the requesting party via Epic fax function and remove from pre-op pool.  Please call with questions.  Wyn Raddle, Jackee Shove, NP 08/28/2023, 8:49 AM

## 2023-08-28 NOTE — Telephone Encounter (Signed)
 CSW consulted to speak with pt son regarding patient care at home and assist with referrals to any appropriate community resources- called son but unable to reach- left VM requesting return call  Andriette HILARIO Leech, LCSW Clinical Social Worker Advanced Heart Failure Clinic Desk#: [phone] Cell#: [phone]

## 2023-08-29 ENCOUNTER — Ambulatory Visit (INDEPENDENT_AMBULATORY_CARE_PROVIDER_SITE_OTHER): Admitting: Physician Assistant

## 2023-08-29 ENCOUNTER — Encounter: Payer: Self-pay | Admitting: Physician Assistant

## 2023-08-29 ENCOUNTER — Telehealth: Payer: Self-pay | Admitting: Physician Assistant

## 2023-08-29 VITALS — BP 132/58 | HR 55 | Temp 98.3°F | Ht 74.0 in | Wt 147.8 lb

## 2023-08-29 DIAGNOSIS — E1169 Type 2 diabetes mellitus with other specified complication: Secondary | ICD-10-CM

## 2023-08-29 DIAGNOSIS — I509 Heart failure, unspecified: Secondary | ICD-10-CM | POA: Diagnosis not present

## 2023-08-29 DIAGNOSIS — K529 Noninfective gastroenteritis and colitis, unspecified: Secondary | ICD-10-CM | POA: Diagnosis not present

## 2023-08-29 DIAGNOSIS — H547 Unspecified visual loss: Secondary | ICD-10-CM

## 2023-08-29 DIAGNOSIS — R32 Unspecified urinary incontinence: Secondary | ICD-10-CM

## 2023-08-29 DIAGNOSIS — R634 Abnormal weight loss: Secondary | ICD-10-CM | POA: Diagnosis not present

## 2023-08-29 NOTE — Telephone Encounter (Signed)
 [facility] faxed Home Health Certificate (Order ID [id number]), to be filled out by provider. Patient requested to send it back via Fax within ASAP. Document is located in providers tray at front office.Please advise at [phone].

## 2023-08-29 NOTE — Telephone Encounter (Signed)
 PT AWARE OF RESULTS VIA SON

## 2023-08-29 NOTE — Progress Notes (Unsigned)
 Patient ID: Darryle Dennie, male    DOB: 1955/01/05, 68 y.o.   MRN: [id number]   Assessment & Plan:  There are no diagnoses linked to this encounter.    Assessment and Plan Assessment & Plan       No follow-ups on file.    Subjective:    No chief complaint on file.   HPI Discussed the use of AI scribe software for clinical note transcription with the patient, who gave verbal consent to proceed.  History of Present Illness      Past Medical History:  Diagnosis Date   Anxiety    Arthritis    Cataract    CHF (congestive heart failure) (HCC)    Constipation    pt states goes EOD- hard stools    Coronary artery disease    Depression    Diabetes mellitus without complication (HCC)    Diabetic retinopathy (HCC)    DKA (diabetic ketoacidoses)    Hyperlipidemia    Hypertension    off meds   Hypertensive retinopathy    Lesion of ulnar nerve, left upper limb 07/12/2017   Non-intractable vomiting without nausea 06/10/2018   Postherpetic neuralgia 08/11/2021   Speculative diagnosis   Psoriasis    Tubular adenoma of colon 03/2019    Past Surgical History:  Procedure Laterality Date   ABDOMINAL AORTOGRAM W/LOWER EXTREMITY N/A 08/30/2020   Procedure: ABDOMINAL AORTOGRAM W/LOWER EXTREMITY;  Surgeon: Serene Gaile ORN, MD;  Location: [facility] INVASIVE CV [facility];  Service: Cardiovascular;  Laterality: N/A;   ABDOMINAL AORTOGRAM W/LOWER EXTREMITY N/A 11/15/2020   Procedure: ABDOMINAL AORTOGRAM W/LOWER EXTREMITY;  Surgeon: Serene Gaile ORN, MD;  Location: [facility] INVASIVE CV LAB;  Service: Cardiovascular;  Laterality: N/A;   ANKLE SURGERY     right   CARDIAC CATHETERIZATION  09/25/2001   COLONOSCOPY     ~10 yr ago- normal  per pt    CORONARY ARTERY BYPASS GRAFT  09/30/2001   CABG x 4   IR THORACENTESIS ASP PLEURAL SPACE W/IMG GUIDE  05/19/2020   IR THORACENTESIS ASP PLEURAL SPACE W/IMG GUIDE  02/04/2023   LAPAROSCOPIC INGUINAL HERNIA REPAIR Bilateral 02/09/2010   MASS EXCISION  Left 03/12/2013   Procedure: LEFT INDEX EXCISION MASS AND DEBRIDMENT DISTAL INTERPHALANGEAL JOINT;  Surgeon: Franky JONELLE Curia, MD;  Location: [facility];  Service: Orthopedics;  Laterality: Left;   PERIPHERAL VASCULAR BALLOON ANGIOPLASTY Left 08/30/2020   Procedure: PERIPHERAL VASCULAR BALLOON ANGIOPLASTY;  Surgeon: Serene Gaile ORN, MD;  Location: [facility]AS[facility];  Service: Cardiovascular;  Laterality: Left;  Peroneal   TRANSESOPHAGEAL ECHOCARDIOGRAM (CATH LAB) N/A 11/26/2022   Procedure: TRANSESOPHAGEAL ECHOCARDIOGRAM;  Surgeon: Kate Lonni CROME, MD;  Location: [facility] INVASIVE CV LAB;  Service: Cardiovascular;  Laterality: N/A;    Family History  Problem Relation Age of Onset   Lymphoma Father    Cancer Paternal Grandmother        Colon Cancer   Colon cancer Paternal Grandmother    Colon polyps Neg Hx    Esophageal cancer Neg Hx    Rectal cancer Neg Hx    Stomach cancer Neg Hx    Diabetes Mellitus I Neg Hx    Pancreatic cancer Neg Hx     Social History   Tobacco Use   Smoking status: Some Days    Types: Cigars    Last attempt to quit: 2022    Years since quitting: 3.6   Smokeless tobacco: Never   Tobacco comments:    1-2  per week cigars  05/09/2023  Vaping Use   Vaping status: Never Used  Substance Use Topics   Alcohol use: Not Currently    Comment: about once a year   Drug use: No     Allergies  Allergen Reactions   Other Hives and Itching    Wool fabric - hives, itching    Review of Systems NEGATIVE UNLESS OTHERWISE INDICATED IN HPI      Objective:     There were no vitals taken for this visit.  Wt Readings from Last 3 Encounters:  08/26/23 148 lb 12.8 oz (67.5 kg)  08/22/23 139 lb 5.3 oz (63.2 kg)  08/08/23 161 lb (73 kg)    BP Readings from Last 3 Encounters:  08/26/23 130/80  08/22/23 (!) 143/80  08/18/23 (!) 199/92     Physical Exam          Deundre Thong M [place] Dalporto, PA-C

## 2023-08-30 ENCOUNTER — Other Ambulatory Visit: Payer: Self-pay | Admitting: *Deleted

## 2023-08-30 DIAGNOSIS — I110 Hypertensive heart disease with heart failure: Secondary | ICD-10-CM | POA: Diagnosis not present

## 2023-08-30 DIAGNOSIS — E1142 Type 2 diabetes mellitus with diabetic polyneuropathy: Secondary | ICD-10-CM

## 2023-08-30 DIAGNOSIS — L89150 Pressure ulcer of sacral region, unstageable: Secondary | ICD-10-CM | POA: Diagnosis not present

## 2023-08-30 DIAGNOSIS — I5082 Biventricular heart failure: Secondary | ICD-10-CM | POA: Diagnosis not present

## 2023-08-30 DIAGNOSIS — I4811 Longstanding persistent atrial fibrillation: Secondary | ICD-10-CM

## 2023-08-30 DIAGNOSIS — F3341 Major depressive disorder, recurrent, in partial remission: Secondary | ICD-10-CM

## 2023-08-30 DIAGNOSIS — I5033 Acute on chronic diastolic (congestive) heart failure: Secondary | ICD-10-CM | POA: Diagnosis not present

## 2023-08-30 DIAGNOSIS — G20A1 Parkinson's disease without dyskinesia, without mention of fluctuations: Secondary | ICD-10-CM

## 2023-08-30 DIAGNOSIS — D509 Iron deficiency anemia, unspecified: Secondary | ICD-10-CM

## 2023-08-30 DIAGNOSIS — F411 Generalized anxiety disorder: Secondary | ICD-10-CM

## 2023-08-30 DIAGNOSIS — E113593 Type 2 diabetes mellitus with proliferative diabetic retinopathy without macular edema, bilateral: Secondary | ICD-10-CM

## 2023-08-30 DIAGNOSIS — R32 Unspecified urinary incontinence: Secondary | ICD-10-CM | POA: Insufficient documentation

## 2023-08-30 DIAGNOSIS — E1151 Type 2 diabetes mellitus with diabetic peripheral angiopathy without gangrene: Secondary | ICD-10-CM

## 2023-08-30 MED ORDER — PAROXETINE HCL 20 MG PO TABS: 20.0000 mg | ORAL_TABLET | Freq: Every day | ORAL | 1 refills | Status: DC

## 2023-08-30 MED ORDER — ATORVASTATIN CALCIUM 40 MG PO TABS: 40.0000 mg | ORAL_TABLET | Freq: Every day | ORAL | 1 refills | Status: DC

## 2023-08-30 NOTE — Addendum Note (Signed)
 Addended by: THURMON ARLAND PARAS on: 08/30/2023 10:57 AM   Modules accepted: Orders

## 2023-08-30 NOTE — Patient Instructions (Signed)
 Visit Information  Thank you for taking time to visit with me today. Please don't hesitate to contact me if I can be of assistance to you before our next scheduled telephone appointment.  Our next appointment is by telephone on 09/19/23 @ 10 am  Following is a copy of your care plan:   Goals Addressed             This Visit's Progress    VBCI Transitions of Care (TOC) Care Plan       Problems:  Recent Hospitalization for treatment of CHF Knowledge Deficit Related to management of CHF Pt lives alone, son checks in on pt BID, provides oversight for all care, pt does not weigh daily and can be safety issue for pt to stand on the scales, CBG monitored with Dexcom G6, son does not know current readings,  son will assist with daily wound care to sacrum and left dorsal foot with oversight provided by [facility] home health.  Pt not interested in another level of care at present, son has consulted with elder care lawyer but pt not ready for any other level of care at present and is able to make his own decisions at this time, pt does not qualify for medicaid per son, wound only qualify for long term medicaid for SNF. 08/30/23- spoke with patient's son who reports pt saw primary care provider 08/29/23, patient's weight is 139 pounds, son is trying to make sure pt is taking in adequate protein, pt is eating a lot of fish and eggs and is now using a protein powder, home health continues working with pt,  patient's son states next week he will be leaving for a 10 day vacation, does not want calls for the next 2 weeks, there is  a hired caregiver that will come in to assist pt but son prefers all calls come to him, does request one final call week of 09/16/23.  Goal:  Over the next 30 days, the patient will not experience hospital readmission  Interventions:  Heart Failure Interventions: Basic overview and discussion of pathophysiology of Heart Failure reviewed Provided education on low sodium diet Assessed  need for readable accurate scales in home Provided education about placing scale on hard, flat surface Advised patient to weigh each morning after emptying bladder Discussed importance of daily weight and advised patient to weigh and record daily Discussed the importance of keeping all appointments with provider Assessed social determinant of health barriers  Reinforced Heart Failure action plan Reinforced signs/ symptoms of infection Reinforced carbohydrate modified diet, importance of good CBG control to promote wound heaing Reviewed all upcoming scheduled appointments Reinforced safety precautions, importance of always using walker Reviewed importance of offering small, frequent, high calorie meals including adequate protein, continue using protein powder  Patient Self Care Activities:  Attend all scheduled provider appointments Call pharmacy for medication refills 3-7 days in advance of running out of medications Call provider office for new concerns or questions  Notify RN Care Manager of TOC call rescheduling needs Participate in Transition of Care Program/Attend TOC scheduled calls Take medications as prescribed   call office if I gain more than 2 pounds in one day or 5 pounds in one week keep legs up while sitting track weight in diary use salt in moderation watch for swelling in feet, ankles and legs every day weigh myself daily develop a rescue plan follow rescue plan if symptoms flare-up eat more whole grains, fruits and vegetables, lean meats and healthy fats track  symptoms and what helps feel better or worse Report any signs/ symptoms of infection to wounds immediately Offer small, frequent meals with adequate calories, include protein at each meal Fall prevention strategies: change positions slowly, use a walker or cane (per provider recommendations) when walking, keep walkways clear, have good lighting in room. It is important to contact your provider if you have any  falls. Maintain muscle strength/tone by exercise per provider recommendations.   Plan:  Telephone follow up appointment with care management team member scheduled for:  09/19/23 @ 10 am The patient has been provided with contact information for the care management team and has been advised to call with any health related questions or concerns.         Patient verbalizes understanding of instructions and care plan provided today and agrees to view in MyChart. Active MyChart status and patient understanding of how to access instructions and care plan via MyChart confirmed with patient.     Telephone follow up appointment with care management team member scheduled for: 09/19/23 @ 10 am  Please call the care guide team at [phone] if you need to cancel or reschedule your appointment.   Please call the Suicide and Crisis Lifeline: 988 call the [place]  National Suicide Prevention Lifeline: 339-007-6574 or TTY: (306)235-4371 TTY 3016330117) to talk to a trained counselor call 1-800-273-TALK (toll free, 24 hour hotline) go to [facility] [address] [place] [phone]) call 911 if you are experiencing a Mental Health or Behavioral Health Crisis or need someone to talk to.  Mliss Creed [facility], BSN RN Care Manager/ Transition of Care [place] [phone]

## 2023-08-30 NOTE — Patient Outreach (Signed)
 Transition of Care week 2  Visit Note  08/30/2023  Name: [name] [name] MRN: [id number]          DOB: 1955-12-04  Situation: Patient enrolled in [facility] 30-day program. Visit completed with patient by telephone.   Background:    Past Medical History:  Diagnosis Date   Anxiety    Arthritis    Cataract    CHF (congestive heart failure) (HCC)    Constipation    pt states goes EOD- hard stools    Coronary artery disease    Depression    Diabetes mellitus without complication (HCC)    Diabetic retinopathy (HCC)    DKA (diabetic ketoacidoses)    Hyperlipidemia    Hypertension    off meds   Hypertensive retinopathy    Lesion of ulnar nerve, left upper limb 07/12/2017   Non-intractable vomiting without nausea 06/10/2018   Postherpetic neuralgia 08/11/2021   Speculative diagnosis   Psoriasis    Tubular adenoma of colon 03/2019    Assessment: Patient Reported Symptoms: Cognitive Cognitive Status: No symptoms reported, Alert and oriented to person, place, and time, Able to follow simple commands (information provided by son)      Neurological Neurological Review of Symptoms: Not assessed (did not speak with pt)    HEENT HEENT Symptoms Reported: Not assessed (did not speak with pt)      Cardiovascular Cardiovascular Symptoms Reported: Not assessed (did not speak with pt) Does patient have uncontrolled Hypertension?: No Weight: 139 lb (63 kg) Cardiovascular Self-Management Outcome: 3 (uncertain)  Respiratory Respiratory Symptoms Reported: Not assesed (did not speak with pt)    Endocrine Endocrine Symptoms Reported: Not assessed (did not speak with pt) Is patient diabetic?: Yes Is patient checking blood sugars at home?: Yes List most recent blood sugar readings, include date and time of day: Dexcom G6- son does not know what recent CBG readings are Endocrine Self-Management Outcome: 3 (uncertain) Endocrine Comment: 7/25  AIC 9.1  Gastrointestinal Gastrointestinal  Symptoms Reported: Not assessed (did not speak with pt)      Genitourinary Genitourinary Symptoms Reported: Not assessed (did not speak wtih pt)    Integumentary Integumentary Symptoms Reported: Wound Additional Integumentary Details: wound to sacrum and left dorsal foot, home health is working with pt, son assists with wound care Skin Management Strategies: Adequate rest, Routine screening Skin Self-Management Outcome: 3 (uncertain) Skin Comment: Reinforced signs/ symptoms of infection  Musculoskeletal Musculoskelatal Symptoms Reviewed: Difficulty walking Additional Musculoskeletal Details: uses rollator walker Musculoskeletal Management Strategies: Adequate rest, Routine screening Musculoskeletal Self-Management Outcome: 3 (uncertain) Musculoskeletal Comment: Reinforced safety precautions      Psychosocial Psychosocial Symptoms Reported: Not assessed Additional Psychological Details: did not speak with pt         There were no vitals filed for this visit.  Medications Reviewed Today     Reviewed by Aura Mliss LABOR, RN (Registered Nurse) on 08/30/23 at 1047  Med List Status: <None>   Medication Order Taking? Sig Documenting Provider Last Dose Status Informant  acetaminophen  (TYLENOL ) 500 MG tablet [id number]  Take 500 mg by mouth as needed for mild pain or moderate pain.  Patient not taking: Reported on 08/29/2023   [provider]  Active Child, Pharmacy Records  atorvastatin  (LIPITOR) 40 MG tablet [id number]  TAKE 1 TABLET BY MOUTH EVERY DAY [name], [name], PA-C  Active Child, Pharmacy Records  Cholecalciferol  (VITAMIN D3) 50 MCG (2000 UT) capsule [id number]  Take 2,000 Units by mouth daily. [provider]  Active Child,  Pharmacy Records  clobetasol  (TEMOVATE ) 0.05 % external solution [id number]  Apply 1 Application topically 2 (two) times daily. For scalp psoriasis. [name], Mardy HERO, PA-C  Active Child, Pharmacy Records  Continuous Glucose Transmitter  (DEXCOM G6 TRANSMITTER) MISC [id number]  USE AS INSTRUCTED TO CHECK BLOOD SUGAR. Shamleffer, Donell Cardinal, MD  Active Child, Pharmacy Records  ELIQUIS  5 MG TABS tablet [id number]  TAKE 1 TABLET BY MOUTH TWICE A DAY O'Neal, Darryle Ned, MD  Active Child, Pharmacy Records  furosemide  (LASIX ) 40 MG tablet [id number]  40 mg daily alternating with 20 mg daily Colletta Manuelita Garre, PA-C  Active   glipiZIDE  (GLUCOTROL ) 5 MG tablet [id number]  Take 1 tablet (5 mg total) by mouth 2 (two) times daily before a meal. Shamleffer, Donell Cardinal, MD  Active   ketoconazole  (NIZORAL ) 2 % shampoo [id number]  Apply 1 Application topically 2 (two) times a week. [name], Mardy HERO, PA-C  Active Child, Pharmacy Records  loperamide  (IMODIUM ) 2 MG capsule [id number]  Take 1 capsule (2 mg total) by mouth 4 (four) times daily as needed for diarrhea or loose stools. Wendolyn Jenkins Jansky, MD  Active Child, Pharmacy Records  loratadine  (CLARITIN ) 10 MG tablet [id number]  TAKE 1 TABLET BY MOUTH EVERY DAY [name], [name], PA-C  Active Child, Pharmacy Records  losartan  (COZAAR ) 25 MG tablet [id number]  Take 1 tablet (25 mg total) by mouth daily. Arrien, Elidia Sieving, MD  Active   mirtazapine  (REMERON ) 15 MG tablet [id number]  TAKE 1 TABLET BY MOUTH EVERYDAY AT BEDTIME [name], [name], PA-C  Active Child, Pharmacy Records  ofloxacin  (OCUFLOX ) 0.3 % ophthalmic solution [id number]  Place 1 drop into the right eye 4 (four) times daily. [provider]  Active Child, Pharmacy Records           Med Note (COFFELL, JON HERO Kitchens Aug 19, 2023 11:27 AM)    OVER THE COUNTER MEDICATION [id number]  Take 500 mg by mouth daily. Optimal Carnivore - grass fed organ complex, 500mg . [provider]  Active Child, Pharmacy Records  PARoxetine  (PAXIL ) 20 MG tablet [id number]  TAKE 1 TABLET ONCE A DAY FOR DEPRESSION [name], [name], PA-C  Active Child, Pharmacy Records  prednisoLONE  acetate (PRED FORTE ) 1 % ophthalmic suspension  [id number]  Place 1 drop into the right eye 4 (four) times daily. [provider]  Active Child, Pharmacy Records           Med Note (COFFELL, JON HERO Kitchens Aug 19, 2023 11:28 AM)    QUEtiapine  (SEROQUEL ) 25 MG tablet [id number]  GIVE 1 TABLET BY MOUTH AT BEDTIME FOR DELIRIUM [name], [name], PA-C  Active Child, Pharmacy Records  sitaGLIPtin  (JANUVIA ) 100 MG tablet [id number]  Take 1 tablet (100 mg total) by mouth daily. Shamleffer, Donell Cardinal, MD  Active Child, Pharmacy Records  spironolactone  (ALDACTONE ) 25 MG tablet [id number]  Take 1 tablet (25 mg total) by mouth daily. Barbaraann Darryle Ned, MD  Active Child, Pharmacy Records            Recommendation:   PCP Follow-up Patient's son will be on 10 day vacation, does not want any calls during this time  Follow Up Plan:   Telephone follow-up 09/19/23 @ 10 am  Mliss Creed [facility], BSN RN Care Manager/ Transition of Care [place]/ [facility] [phone]

## 2023-08-30 NOTE — Patient Instructions (Signed)
  VISIT SUMMARY: You had a follow-up visit after your recent hospital stay for acute right-sided congestive heart failure. We discussed your current medications, ongoing symptoms, and upcoming appointments.  YOUR PLAN: ACUTE RIGHT-SIDED CONGESTIVE HEART FAILURE: You were recently hospitalized for acute right-sided congestive heart failure and are currently on furosemide  for diuresis. -Continue taking furosemide  40 mg alternating with 20 mg every other day. -Monitor for signs of fluid overload, such as swelling in your ankles or feet, and adjust furosemide  as needed. -Follow up with cardiology on November 6th. -Get your renal function and electrolytes checked in 7 days. -Weigh yourself daily at home.  TYPE 2 DIABETES MELLITUS: Your type 2 diabetes is being managed with the help of your endocrinologist and you are currently taking glipizide . -Continue coordinating your diabetes care with your endocrinologist.  CHRONIC DIARRHEA AND FECAL INCONTINENCE: You have been experiencing chronic diarrhea and fecal incontinence. -Proceed with your scheduled colonoscopy on October 14th to investigate potential causes.  HYPERTENSIVE RETINOPATHY: You recently had eye surgery that preserved vision in your right eye, but your vision is not sufficient for driving. -We will look into state resources for partially blind individuals to assist you.  UNINTENTIONAL WEIGHT LOSS: You have experienced weight loss, but your weight has stabilized recently. -Continue to rest and maintain good nutrition. -Monitor your weight regularly.  URINARY INCONTINENCE: You have occasional urinary incontinence, which is being managed with adult diapers. -Continue using adult diapers as needed.                      Contains text generated by [employer].                                 Contains text generated by [employer].

## 2023-09-06 ENCOUNTER — Encounter (HOSPITAL_COMMUNITY): Payer: Self-pay | Admitting: *Deleted

## 2023-09-10 ENCOUNTER — Encounter: Payer: Self-pay | Admitting: Adult Health

## 2023-09-10 NOTE — Telephone Encounter (Signed)
 Please see pt [name] message regarding patient and advise

## 2023-09-12 ENCOUNTER — Emergency Department (HOSPITAL_COMMUNITY)

## 2023-09-12 ENCOUNTER — Observation Stay (HOSPITAL_COMMUNITY)
Admission: EM | Admit: 2023-09-12 | Discharge: 2023-09-16 | Disposition: A | Discharge status: Home / Self Care | Attending: Family Medicine | Admitting: Family Medicine

## 2023-09-12 ENCOUNTER — Other Ambulatory Visit: Payer: Self-pay

## 2023-09-12 ENCOUNTER — Encounter (HOSPITAL_COMMUNITY): Payer: Self-pay | Admitting: Internal Medicine

## 2023-09-12 DIAGNOSIS — Z9889 Other specified postprocedural states: Secondary | ICD-10-CM

## 2023-09-12 DIAGNOSIS — L400 Psoriasis vulgaris: Secondary | ICD-10-CM | POA: Diagnosis not present

## 2023-09-12 DIAGNOSIS — H35039 Hypertensive retinopathy, unspecified eye: Secondary | ICD-10-CM | POA: Insufficient documentation

## 2023-09-12 DIAGNOSIS — N138 Other obstructive and reflux uropathy: Secondary | ICD-10-CM | POA: Diagnosis present

## 2023-09-12 DIAGNOSIS — F411 Generalized anxiety disorder: Secondary | ICD-10-CM | POA: Diagnosis not present

## 2023-09-12 DIAGNOSIS — Z8673 Personal history of transient ischemic attack (TIA), and cerebral infarction without residual deficits: Secondary | ICD-10-CM | POA: Insufficient documentation

## 2023-09-12 DIAGNOSIS — I480 Paroxysmal atrial fibrillation: Secondary | ICD-10-CM | POA: Diagnosis not present

## 2023-09-12 DIAGNOSIS — Z7984 Long term (current) use of oral hypoglycemic drugs: Secondary | ICD-10-CM | POA: Diagnosis not present

## 2023-09-12 DIAGNOSIS — N179 Acute kidney failure, unspecified: Secondary | ICD-10-CM | POA: Diagnosis not present

## 2023-09-12 DIAGNOSIS — E785 Hyperlipidemia, unspecified: Secondary | ICD-10-CM | POA: Diagnosis present

## 2023-09-12 DIAGNOSIS — E11319 Type 2 diabetes mellitus with unspecified diabetic retinopathy without macular edema: Secondary | ICD-10-CM | POA: Diagnosis not present

## 2023-09-12 DIAGNOSIS — I10 Essential (primary) hypertension: Secondary | ICD-10-CM | POA: Diagnosis present

## 2023-09-12 DIAGNOSIS — Z79899 Other long term (current) drug therapy: Secondary | ICD-10-CM | POA: Diagnosis not present

## 2023-09-12 DIAGNOSIS — N401 Enlarged prostate with lower urinary tract symptoms: Secondary | ICD-10-CM | POA: Insufficient documentation

## 2023-09-12 DIAGNOSIS — R159 Full incontinence of feces: Secondary | ICD-10-CM | POA: Insufficient documentation

## 2023-09-12 DIAGNOSIS — D500 Iron deficiency anemia secondary to blood loss (chronic): Secondary | ICD-10-CM | POA: Diagnosis not present

## 2023-09-12 DIAGNOSIS — I509 Heart failure, unspecified: Secondary | ICD-10-CM | POA: Insufficient documentation

## 2023-09-12 DIAGNOSIS — F3341 Major depressive disorder, recurrent, in partial remission: Secondary | ICD-10-CM | POA: Diagnosis not present

## 2023-09-12 DIAGNOSIS — H548 Legal blindness, as defined in USA: Secondary | ICD-10-CM | POA: Diagnosis not present

## 2023-09-12 DIAGNOSIS — R410 Disorientation, unspecified: Secondary | ICD-10-CM

## 2023-09-12 DIAGNOSIS — E119 Type 2 diabetes mellitus without complications: Secondary | ICD-10-CM

## 2023-09-12 DIAGNOSIS — D649 Anemia, unspecified: Secondary | ICD-10-CM | POA: Diagnosis present

## 2023-09-12 DIAGNOSIS — I25812 Atherosclerosis of bypass graft of coronary artery of transplanted heart without angina pectoris: Secondary | ICD-10-CM | POA: Diagnosis present

## 2023-09-12 DIAGNOSIS — I110 Hypertensive heart disease with heart failure: Secondary | ICD-10-CM | POA: Diagnosis not present

## 2023-09-12 DIAGNOSIS — F17290 Nicotine dependence, other tobacco product, uncomplicated: Secondary | ICD-10-CM | POA: Insufficient documentation

## 2023-09-12 DIAGNOSIS — I739 Peripheral vascular disease, unspecified: Secondary | ICD-10-CM | POA: Diagnosis present

## 2023-09-12 DIAGNOSIS — Z95828 Presence of other vascular implants and grafts: Secondary | ICD-10-CM | POA: Insufficient documentation

## 2023-09-12 DIAGNOSIS — E1165 Type 2 diabetes mellitus with hyperglycemia: Secondary | ICD-10-CM

## 2023-09-12 DIAGNOSIS — G928 Other toxic encephalopathy: Secondary | ICD-10-CM | POA: Insufficient documentation

## 2023-09-12 DIAGNOSIS — R509 Fever, unspecified: Secondary | ICD-10-CM | POA: Diagnosis not present

## 2023-09-12 DIAGNOSIS — Z7901 Long term (current) use of anticoagulants: Secondary | ICD-10-CM | POA: Insufficient documentation

## 2023-09-12 DIAGNOSIS — N390 Urinary tract infection, site not specified: Secondary | ICD-10-CM

## 2023-09-12 DIAGNOSIS — N19 Unspecified kidney failure: Principal | ICD-10-CM | POA: Insufficient documentation

## 2023-09-12 DIAGNOSIS — I2510 Atherosclerotic heart disease of native coronary artery without angina pectoris: Secondary | ICD-10-CM | POA: Insufficient documentation

## 2023-09-12 DIAGNOSIS — L89159 Pressure ulcer of sacral region, unspecified stage: Secondary | ICD-10-CM | POA: Diagnosis not present

## 2023-09-12 DIAGNOSIS — I70209 Unspecified atherosclerosis of native arteries of extremities, unspecified extremity: Secondary | ICD-10-CM | POA: Insufficient documentation

## 2023-09-12 DIAGNOSIS — I5032 Chronic diastolic (congestive) heart failure: Secondary | ICD-10-CM | POA: Diagnosis not present

## 2023-09-12 DIAGNOSIS — Z8679 Personal history of other diseases of the circulatory system: Secondary | ICD-10-CM

## 2023-09-12 DIAGNOSIS — F17210 Nicotine dependence, cigarettes, uncomplicated: Secondary | ICD-10-CM | POA: Insufficient documentation

## 2023-09-12 DIAGNOSIS — G9340 Encephalopathy, unspecified: Secondary | ICD-10-CM | POA: Diagnosis not present

## 2023-09-12 DIAGNOSIS — R4182 Altered mental status, unspecified: Secondary | ICD-10-CM | POA: Diagnosis present

## 2023-09-12 DIAGNOSIS — L409 Psoriasis, unspecified: Secondary | ICD-10-CM | POA: Diagnosis present

## 2023-09-12 DIAGNOSIS — R392 Extrarenal uremia: Secondary | ICD-10-CM | POA: Insufficient documentation

## 2023-09-12 DIAGNOSIS — R4189 Other symptoms and signs involving cognitive functions and awareness: Secondary | ICD-10-CM | POA: Insufficient documentation

## 2023-09-12 DIAGNOSIS — Z951 Presence of aortocoronary bypass graft: Secondary | ICD-10-CM | POA: Insufficient documentation

## 2023-09-12 DIAGNOSIS — E1110 Type 2 diabetes mellitus with ketoacidosis without coma: Secondary | ICD-10-CM | POA: Insufficient documentation

## 2023-09-12 LAB — URINALYSIS, ROUTINE W REFLEX MICROSCOPIC
Bilirubin Urine: NEGATIVE
Glucose, UA: 50 mg/dL — AB
Ketones, ur: NEGATIVE mg/dL
Nitrite: NEGATIVE
Protein, ur: 100 mg/dL — AB
RBC / HPF: >50 RBC/hpf (ref 0–5)
Specific Gravity, Urine: 1.013 (ref 1.005–1.030)
pH: 5 (ref 5.0–8.0)

## 2023-09-12 LAB — CBC WITH DIFFERENTIAL/PLATELET
Abs Immature Granulocytes: 0.04 K/uL (ref 0.00–0.07)
Basophils Absolute: 0 K/uL (ref 0.0–0.1)
Basophils Relative: 1 %
Eosinophils Absolute: 0.1 K/uL (ref 0.0–0.5)
Eosinophils Relative: 1 %
HCT: 33.1 % — ABNORMAL LOW (ref 39.0–52.0)
Hemoglobin: 10 g/dL — ABNORMAL LOW (ref 13.0–17.0)
Immature Granulocytes: 1 %
Lymphocytes Relative: 14 %
Lymphs Abs: 1.1 K/uL (ref 0.7–4.0)
MCH: 27.2 pg (ref 26.0–34.0)
MCHC: 30.2 g/dL (ref 30.0–36.0)
MCV: 90.2 fL (ref 80.0–100.0)
Monocytes Absolute: 1 K/uL (ref 0.1–1.0)
Monocytes Relative: 12 %
Neutro Abs: 5.7 K/uL (ref 1.7–7.7)
Neutrophils Relative %: 71 %
Platelets: 228 K/uL (ref 150–400)
RBC: 3.67 MIL/uL — ABNORMAL LOW (ref 4.22–5.81)
RDW: 15.5 % (ref 11.5–15.5)
WBC: 8 K/uL (ref 4.0–10.5)
nRBC: 0 % (ref 0.0–0.2)

## 2023-09-12 LAB — COMPREHENSIVE METABOLIC PANEL WITH GFR
ALT: 22 U/L (ref 0–44)
AST: 22 U/L (ref 15–41)
Albumin: 2.6 g/dL — ABNORMAL LOW (ref 3.5–5.0)
Alkaline Phosphatase: 153 U/L — ABNORMAL HIGH (ref 38–126)
Anion gap: 13 (ref 5–15)
BUN: 100 mg/dL — ABNORMAL HIGH (ref 8–23)
CO2: 19 mmol/L — ABNORMAL LOW (ref 22–32)
Calcium: 8.4 mg/dL — ABNORMAL LOW (ref 8.9–10.3)
Chloride: 100 mmol/L (ref 98–111)
Creatinine, Ser: 1.46 mg/dL — ABNORMAL HIGH (ref 0.61–1.24)
GFR, Estimated: 51 mL/min — ABNORMAL LOW (ref >60)
Glucose, Bld: 263 mg/dL — ABNORMAL HIGH (ref 70–99)
Potassium: 4.9 mmol/L (ref 3.5–5.1)
Sodium: 132 mmol/L — ABNORMAL LOW (ref 135–145)
Total Bilirubin: 0.6 mg/dL (ref 0.0–1.2)
Total Protein: 7 g/dL (ref 6.5–8.1)

## 2023-09-12 LAB — GLUCOSE, CAPILLARY
Glucose-Capillary: 227 mg/dL — ABNORMAL HIGH (ref 70–99)
Glucose-Capillary: 204 mg/dL — ABNORMAL HIGH (ref 70–99)

## 2023-09-12 LAB — RAPID URINE DRUG SCREEN, HOSP PERFORMED
Amphetamines: NOT DETECTED
Barbiturates: NOT DETECTED
Benzodiazepines: NOT DETECTED
Cocaine: NOT DETECTED
Opiates: NOT DETECTED
Tetrahydrocannabinol: NOT DETECTED

## 2023-09-12 LAB — I-STAT CHEM 8, ED
BUN: 104 mg/dL — ABNORMAL HIGH (ref 8–23)
Calcium, Ion: 1.13 mmol/L — ABNORMAL LOW (ref 1.15–1.40)
Chloride: 100 mmol/L (ref 98–111)
Creatinine, Ser: 1.5 mg/dL — ABNORMAL HIGH (ref 0.61–1.24)
Glucose, Bld: 268 mg/dL — ABNORMAL HIGH (ref 70–99)
HCT: 32 % — ABNORMAL LOW (ref 39.0–52.0)
Hemoglobin: 10.9 g/dL — ABNORMAL LOW (ref 13.0–17.0)
Potassium: 4.9 mmol/L (ref 3.5–5.1)
Sodium: 132 mmol/L — ABNORMAL LOW (ref 135–145)
TCO2: 21 mmol/L — ABNORMAL LOW (ref 22–32)

## 2023-09-12 LAB — TROPONIN I (HIGH SENSITIVITY)
Troponin I (High Sensitivity): 23 ng/L — ABNORMAL HIGH (ref <18)
Troponin I (High Sensitivity): 24 ng/L — ABNORMAL HIGH (ref <18)

## 2023-09-12 LAB — T4, FREE: Free T4: 1.15 ng/dL — ABNORMAL HIGH (ref 0.61–1.12)

## 2023-09-12 LAB — ETHANOL: Alcohol, Ethyl (B): <15 mg/dL (ref <15)

## 2023-09-12 LAB — BRAIN NATRIURETIC PEPTIDE: B Natriuretic Peptide: 184.1 pg/mL — ABNORMAL HIGH (ref 0.0–100.0)

## 2023-09-12 LAB — RESP PANEL BY RT-PCR (RSV, FLU A&B, COVID)  RVPGX2
Influenza A by PCR: NEGATIVE
Influenza B by PCR: NEGATIVE
Resp Syncytial Virus by PCR: NEGATIVE
SARS Coronavirus 2 by RT PCR: NEGATIVE

## 2023-09-12 LAB — AMMONIA: Ammonia: 19 umol/L (ref 9–35)

## 2023-09-12 LAB — TSH: TSH: 2.618 u[IU]/mL (ref 0.350–4.500)

## 2023-09-12 MED ORDER — MIRTAZAPINE 15 MG PO TABS
15.0000 mg | ORAL_TABLET | Freq: Every day | ORAL | Status: DC
  Administered 2023-09-12 – 2023-09-15 (×4): 15 mg via ORAL
  Filled 2023-09-12 (×4): qty 1

## 2023-09-12 MED ORDER — ACETAMINOPHEN 650 MG RE SUPP: 650.0000 mg | Freq: Four times a day (QID) | RECTAL | Status: DC | PRN

## 2023-09-12 MED ORDER — ATORVASTATIN CALCIUM 40 MG PO TABS
40.0000 mg | ORAL_TABLET | Freq: Every day | ORAL | Status: DC
  Administered 2023-09-13 – 2023-09-16 (×4): 40 mg via ORAL
  Filled 2023-09-12 (×4): qty 1

## 2023-09-12 MED ORDER — SPIRONOLACTONE 12.5 MG HALF TABLET
25.0000 mg | ORAL_TABLET | Freq: Every day | ORAL | Status: DC
Start: 2023-09-13 — End: 2023-09-12

## 2023-09-12 MED ORDER — APIXABAN 5 MG PO TABS
5.0000 mg | ORAL_TABLET | Freq: Two times a day (BID) | ORAL | Status: DC
  Administered 2023-09-12 – 2023-09-16 (×8): 5 mg via ORAL
  Filled 2023-09-12 (×8): qty 1

## 2023-09-12 MED ORDER — SODIUM CHLORIDE 0.9 % IV SOLN
1.0000 g | Freq: Once | INTRAVENOUS | Status: AC
  Administered 2023-09-12: 1 g via INTRAVENOUS
  Filled 2023-09-12: qty 10

## 2023-09-12 MED ORDER — LOSARTAN POTASSIUM 50 MG PO TABS: 25.0000 mg | ORAL_TABLET | Freq: Every day | ORAL | Status: DC

## 2023-09-12 MED ORDER — ORAL CARE MOUTH RINSE: 15.0000 mL | OROMUCOSAL | Status: DC | PRN

## 2023-09-12 MED ORDER — PAROXETINE HCL 20 MG PO TABS
20.0000 mg | ORAL_TABLET | Freq: Every day | ORAL | Status: DC
  Administered 2023-09-13 – 2023-09-16 (×4): 20 mg via ORAL
  Filled 2023-09-12 (×4): qty 1

## 2023-09-12 MED ORDER — SODIUM CHLORIDE 0.9% FLUSH
3.0000 mL | Freq: Two times a day (BID) | INTRAVENOUS | Status: DC
  Administered 2023-09-12 – 2023-09-16 (×7): 3 mL via INTRAVENOUS

## 2023-09-12 MED ORDER — ACETAMINOPHEN 325 MG PO TABS: 650.0000 mg | ORAL_TABLET | Freq: Four times a day (QID) | ORAL | Status: DC | PRN

## 2023-09-12 MED ORDER — SODIUM CHLORIDE 0.9 % IV SOLN
INTRAVENOUS | Status: AC
Start: 2023-09-12 — End: 2023-09-13

## 2023-09-12 MED ORDER — INSULIN ASPART 100 UNIT/ML IJ SOLN
0.0000 [IU] | Freq: Three times a day (TID) | INTRAMUSCULAR | Status: DC
  Administered 2023-09-12: 3 [IU] via SUBCUTANEOUS
  Administered 2023-09-13: 2 [IU] via SUBCUTANEOUS
  Administered 2023-09-13: 3 [IU] via SUBCUTANEOUS
  Administered 2023-09-13 – 2023-09-14 (×2): 2 [IU] via SUBCUTANEOUS
  Administered 2023-09-14 (×2): 1 [IU] via SUBCUTANEOUS
  Administered 2023-09-15 (×2): 3 [IU] via SUBCUTANEOUS
  Administered 2023-09-16 (×2): 2 [IU] via SUBCUTANEOUS

## 2023-09-12 MED ORDER — POLYETHYLENE GLYCOL 3350 17 G PO PACK: 17.0000 g | PACK | Freq: Every day | ORAL | Status: DC | PRN

## 2023-09-12 MED ORDER — QUETIAPINE FUMARATE 25 MG PO TABS
25.0000 mg | ORAL_TABLET | Freq: Every day | ORAL | Status: DC
  Administered 2023-09-12 – 2023-09-15 (×4): 25 mg via ORAL
  Filled 2023-09-12 (×4): qty 1

## 2023-09-12 MED ORDER — SODIUM CHLORIDE 0.9 % IV BOLUS
1000.0000 mL | Freq: Once | INTRAVENOUS | Status: AC
  Administered 2023-09-12: 1000 mL via INTRAVENOUS

## 2023-09-12 NOTE — ED Notes (Signed)
 Pt asked to produce urine sample, said he will when he has to go.

## 2023-09-12 NOTE — ED Notes (Signed)
 PT OTF to CT

## 2023-09-12 NOTE — H&P (Signed)
 History and Physical   Hendry Speas FMW:[id number] DOB: Jul 08, 1955 DOA: 09/12/2023  PCP: [name] HERO, PA-C   Patient coming from: Home  Chief Complaint: Altered mental status  HPI: [name] [name] is a 68 y.o. male with medical history significant of hypertension, hyperlipidemia, diabetes, PAD, CAD status post CABG, paroxysmal atrial fibrillation with history of SVR, CVA, CHF, sacral ulcer, depression, anxiety, aneurysm repair, psoriasis, anemia, BPH presenting with altered mental status.  Family reports some history of decline in mental status with concern for possible developing dementia but this has not yet been diagnosed as outpatient.  Son will typically check on him and they do have cameras in his home to help monitor him.  Son was out of town and son's girlfriend went to check on him today.  Patient was found covered in feces with feces and baby powder throughout the house as well.  Patient alert but not fully oriented.  Denies specific complaints.  ED Course: Vital signs in the ED notable for heart rate in the 40s-50s, blood pressure in the 120s-150s systolic.  Lab workup included CMP with sodium 132, BUN 100, creatinine elevated 1.46 baseline 1.1, glucose 263, bicarb 19, calcium  8.4, albumin 2.6, alk phos 153.  CBC with hemoglobin stable at 10.  Troponin 23 with repeat pending.  BNP indeterminate at 184.  T4 mildly elevated at 1.15, TSH normal.  Rester panel flu COVID RSV negative.  Urinalysis notable for leukocytes and bacteria.  Urine culture pending.  Ammonia level negative, alcohol level negative, UDS negative.  Chest x-ray showed partially loculated bilateral pleural effusions which are similar to previous on 8/20.  CT head showed no acute normality.  Patient received 1 L IV fluids and dose of ceftriaxone  in the ED.  Review of Systems: Patient alert but not fully oriented.  Denies specific complaints.  Past Medical History:  Diagnosis Date   Acute urinary  retention 01/04/2022   Altered mental status 08/18/2023   Anxiety    Arthritis    Cataract    CHF (congestive heart failure) (HCC)    Constipation    pt states goes EOD- hard stools    Coronary artery disease    Depression    Diabetes mellitus without complication (HCC)    Diabetic retinopathy (HCC)    DKA (diabetic ketoacidoses)    Hyperlipidemia    Hypertension    off meds   Hypertensive retinopathy    Lesion of ulnar nerve, left upper limb 07/12/2017   Non-intractable vomiting without nausea 06/10/2018   Postherpetic neuralgia 08/11/2021   Speculative diagnosis   Psoriasis    Tubular adenoma of colon 03/2019   UTI (urinary tract infection) 01/05/2022    Past Surgical History:  Procedure Laterality Date   ABDOMINAL AORTOGRAM W/LOWER EXTREMITY N/A 08/30/2020   Procedure: ABDOMINAL AORTOGRAM W/LOWER EXTREMITY;  Surgeon: Serene Gaile ORN, MD;  Location: [facility];  Service: Cardiovascular;  Laterality: N/A;   ABDOMINAL AORTOGRAM W/LOWER EXTREMITY N/A 11/15/2020   Procedure: ABDOMINAL AORTOGRAM W/LOWER EXTREMITY;  Surgeon: Serene Gaile ORN, MD;  Location: [facility];  Service: Cardiovascular;  Laterality: N/A;   ANKLE SURGERY     right   CARDIAC CATHETERIZATION  09/25/2001   COLONOSCOPY     ~10 yr ago- normal  per pt    CORONARY ARTERY BYPASS GRAFT  09/30/2001   CABG x 4   IR THORACENTESIS ASP PLEURAL SPACE W/IMG GUIDE  05/19/2020   IR THORACENTESIS ASP PLEURAL SPACE W/IMG GUIDE  02/04/2023  LAPAROSCOPIC INGUINAL HERNIA REPAIR Bilateral 02/09/2010   MASS EXCISION Left 03/12/2013   Procedure: LEFT INDEX EXCISION MASS AND DEBRIDMENT DISTAL INTERPHALANGEAL JOINT;  Surgeon: Franky JONELLE Curia, MD;  Location: [facility];  Service: Orthopedics;  Laterality: Left;   PERIPHERAL VASCULAR BALLOON ANGIOPLASTY Left 08/30/2020   Procedure: PERIPHERAL VASCULAR BALLOON ANGIOPLASTY;  Surgeon: Serene Gaile ORN, MD;  Location: [facility];  Service: Cardiovascular;   Laterality: Left;  Peroneal   TRANSESOPHAGEAL ECHOCARDIOGRAM (CATH LAB) N/A 11/26/2022   Procedure: TRANSESOPHAGEAL ECHOCARDIOGRAM;  Surgeon: Kate Lonni CROME, MD;  Location: [facility];  Service: Cardiovascular;  Laterality: N/A;    Social History  reports that he has been smoking cigars. He has never used smokeless tobacco. He reports that he does not currently use alcohol. He reports that he does not use drugs.  Allergies  Allergen Reactions   Other Hives and Itching    Wool fabric - hives, itching    Family History  Problem Relation Age of Onset   Lymphoma Father    Cancer Paternal Grandmother        Colon Cancer   Colon cancer Paternal Grandmother    Colon polyps Neg Hx    Esophageal cancer Neg Hx    Rectal cancer Neg Hx    Stomach cancer Neg Hx    Diabetes Mellitus I Neg Hx    Pancreatic cancer Neg Hx   Reviewed on admission  Prior to Admission medications   Medication Sig Start Date End Date Taking? Authorizing Provider  acetaminophen  (TYLENOL ) 500 MG tablet Take 500 mg by mouth as needed for mild pain (pain score 1-3) or moderate pain (pain score 4-6).   Yes [provider]  atorvastatin  (LIPITOR) 40 MG tablet Take 1 tablet (40 mg total) by mouth daily. 08/30/23  Yes [name], [name], PA-C  Cholecalciferol  (VITAMIN D3) 50 MCG (2000 UT) capsule Take 2,000 Units by mouth daily.   Yes [provider]  ELIQUIS  5 MG TABS tablet TAKE 1 TABLET BY MOUTH TWICE A DAY 04/19/23  Yes O'Neal, Darryle Ned, MD  furosemide  (LASIX ) 40 MG tablet 40 mg daily alternating with 20 mg daily 08/26/23  Yes Colletta Manuelita Garre, PA-C  glipiZIDE  (GLUCOTROL ) 5 MG tablet Take 1 tablet (5 mg total) by mouth 2 (two) times daily before a meal. Patient taking differently: Take 2.5-5 mg by mouth 2 (two) times daily before a meal. 1qam ssqpm 08/22/23  Yes Shamleffer, Ibtehal Jaralla, MD  loperamide  (IMODIUM ) 2 MG capsule Take 1 capsule (2 mg total) by mouth 4 (four) times  daily as needed for diarrhea or loose stools. 01/01/23  Yes Wendolyn Jenkins Jansky, MD  loratadine  (CLARITIN ) 10 MG tablet TAKE 1 TABLET BY MOUTH EVERY DAY Patient taking differently: Take 10 mg by mouth daily as needed for allergies. 05/30/23  Yes [name], [name], PA-C  losartan  (COZAAR ) 25 MG tablet Take 1 tablet (25 mg total) by mouth daily. 08/23/23 09/22/23 Yes Arrien, Elidia Sieving, MD  mirtazapine  (REMERON ) 15 MG tablet TAKE 1 TABLET BY MOUTH EVERYDAY AT BEDTIME 04/23/23  Yes [name], [name], PA-C  OVER THE COUNTER MEDICATION Take 500 mg by mouth daily. Optimal Carnivore - grass fed organ complex, 500mg .   Yes [provider]  PARoxetine  (PAXIL ) 20 MG tablet Take 1 tablet (20 mg total) by mouth daily. 08/30/23  Yes [name], [name], PA-C  QUEtiapine  (SEROQUEL ) 25 MG tablet GIVE 1 TABLET BY MOUTH AT BEDTIME FOR DELIRIUM 06/18/23  Yes [name],  [name], PA-C  sitaGLIPtin  (JANUVIA ) 100 MG tablet Take 1 tablet (100 mg total) by mouth daily. 08/08/23  Yes Shamleffer, Ibtehal Jaralla, MD  spironolactone  (ALDACTONE ) 25 MG tablet Take 1 tablet (25 mg total) by mouth daily. 03/15/23 10/12/23 Yes O'Neal, Darryle Ned, MD    Physical Exam: Vitals:   09/12/23 1435 09/12/23 1436 09/12/23 1437 09/12/23 1457  BP:  136/68    Pulse: (!) 38 (!) 42 (!) 47   Resp: 16 (!) 22 14   Temp:    97.7 F (36.5 C)  TempSrc:    Oral  SpO2: 99% 98% 98%   Weight:      Height:        Physical Exam Constitutional:      General: He is not in acute distress.    Appearance: Normal appearance.  HENT:     Head: Normocephalic and atraumatic.     Mouth/Throat:     Mouth: Mucous membranes are moist.     Pharynx: Oropharynx is clear.  Eyes:     Extraocular Movements: Extraocular movements intact.     Pupils: Pupils are equal, round, and reactive to light.  Cardiovascular:     Rate and Rhythm: Normal rate and regular rhythm.     Pulses: Normal pulses.     Heart sounds: Normal heart sounds.  Pulmonary:      Effort: Pulmonary effort is normal. No respiratory distress.     Breath sounds: Normal breath sounds.  Abdominal:     General: Bowel sounds are normal. There is no distension.     Palpations: Abdomen is soft.     Tenderness: There is no abdominal tenderness.  Musculoskeletal:        General: No swelling or deformity.  Skin:    General: Skin is warm and dry.  Neurological:     General: No focal deficit present.     Comments: Alert    Labs on Admission: I have personally reviewed following labs and imaging studies  CBC: Recent Labs  Lab 09/12/23 1150 09/12/23 1157  WBC  --  8.0  NEUTROABS  --  5.7  HGB 10.9* 10.0*  HCT 32.0* 33.1*  MCV  --  90.2  PLT  --  228    Basic Metabolic Panel: Recent Labs  Lab 09/12/23 1150 09/12/23 1156  NA 132* 132*  K 4.9 4.9  CL 100 100  CO2  --  19*  GLUCOSE 268* 263*  BUN 104* 100*  CREATININE 1.50* 1.46*  CALCIUM   --  8.4*    GFR: Estimated Creatinine Clearance: 42 mL/min (A) (by C-G formula based on SCr of 1.46 mg/dL (H)).  Liver Function Tests: Recent Labs  Lab 09/12/23 1156  AST 22  ALT 22  ALKPHOS 153*  BILITOT 0.6  PROT 7.0  ALBUMIN 2.6*    Urine analysis:    Component Value Date/Time   COLORURINE AMBER (A) 09/12/2023 1414   APPEARANCEUR CLOUDY (A) 09/12/2023 1414   LABSPEC 1.013 09/12/2023 1414   PHURINE 5.0 09/12/2023 1414   GLUCOSEU 50 (A) 09/12/2023 1414   GLUCOSEU >=1000 (A) 03/16/2020 0958   HGBUR MODERATE (A) 09/12/2023 1414   BILIRUBINUR NEGATIVE 09/12/2023 1414   BILIRUBINUR Negative 12/31/2022 1222   KETONESUR NEGATIVE 09/12/2023 1414   PROTEINUR 100 (A) 09/12/2023 1414   UROBILINOGEN 1.0 12/31/2022 1222   UROBILINOGEN 0.2 03/16/2020 0958   NITRITE NEGATIVE 09/12/2023 1414   LEUKOCYTESUR MODERATE (A) 09/12/2023 1414    Radiological Exams on Admission: CT Head Wo  Contrast Result Date: 09/12/2023 CLINICAL DATA:  Mental status change, unknown cause EXAM: CT HEAD WITHOUT CONTRAST TECHNIQUE:  Contiguous axial images were obtained from the base of the skull through the vertex without intravenous contrast. RADIATION DOSE REDUCTION: This exam was performed according to the departmental dose-optimization program which includes automated exposure control, adjustment of the mA and/or kV according to patient size and/or use of iterative reconstruction technique. COMPARISON:  02/02/2023 FINDINGS: Brain: The ventricles appear age appropriate. No mass effect or midline shift. Gray-white differentiation is preserved without focal attenuation abnormality.No evidence of acute territorial infarction, extra-axial fluid collection, hemorrhage, or mass lesion. The basilar cisterns are patent without downward herniation. The cerebellar hemispheres and vermis are well formed without mass lesion or focal attenuation abnormality. Vascular: No hyperdense vessel. Calcified atherosclerotic plaque within the cavernous/supraclinoid ICA and intradural vertebral arteries. Skull: Normal. Negative for fracture or focal lesion. Sinuses/Orbits: The paranasal sinuses and mastoids are clear.The globes appear intact. No retrobulbar hematoma.Bilateral lens replacements. Other: None. IMPRESSION: No acute intracranial abnormality, specifically, no acute hemorrhage, territorial infarction, or intracranial mass. Electronically Signed   By: Rogelia Myers M.D.   On: 09/12/2023 10:54   DG Chest Portable 1 View Result Date: 09/12/2023 CLINICAL DATA:  68 year old male with bilateral pleural effusions. EXAM: PORTABLE CHEST 1 VIEW COMPARISON:  Portable chest 08/21/2023 and earlier. FINDINGS: Portable AP semi upright view at 1010 hours. Chronic sternotomy and CABG. Stable cardiac size and mediastinal contours. Visualized tracheal air column is within normal limits. Partially loculated bilateral pleural effusions demonstrated on noncontrast CT last month. Confluent and veiling right greater than left lung base opacity has not significantly changed  from 08/21/2023. No areas of worsening ventilation. No pneumothorax or edema. Stable visualized osseous structures. IMPRESSION: Partially loculated bilateral pleural effusions not significantly changed from 08/21/2023. No new cardiopulmonary abnormality. Electronically Signed   By: VEAR Hurst M.D.   On: 09/12/2023 10:22   EKG: Independently reviewed.  Atrial flutter versus fibrillation with SVR at 51 bpm.  Assessment/Plan Active Problems:   Peripheral artery disease (HCC)   GAD (generalized anxiety disorder)   Hypertension   Psoriasis   S/P aortic aneurysm repair   S/P CABG (coronary artery bypass graft)   Normocytic anemia   Hyperlipidemia LDL goal <70   Coronary artery disease involving bypass graft of transplanted heart without angina pectoris   Paroxysmal atrial fibrillation (HCC)   Recurrent major depressive disorder, in partial remission (HCC)   BPH with obstruction/lower urinary tract symptoms   History of CVA (cerebrovascular accident)   CHF (congestive heart failure) (HCC)   AKI (acute kidney injury) (HCC)   Uremia   Diabetes (HCC)   Acute encephalopathy   Acute encephalopathy Uremia ?Underlying dementia > Patient presenting after being found by son's girlfriend in feces when she went to check on him.  Patient is alert and partially oriented but not oriented to situation. > Family have reported concerns about possibly developing underlying dementia but this has not been evaluated outpatient. > Workup in the ED notable for urinalysis with hemoglobin and leukocytes though no leukocytosis on CBC.  Also noted to have AKI with significantly elevated BUN raising possibility of uremia as etiology. > Otherwise negative workup in ED included stable chest x-ray, stable CT head, troponin.  Mildly elevated T4, normal TSH, indeterminate BNP at 184.  Negative respiratory viral panel.  Normal ammonia, normal ethanol level, normal UDS. > Patient has been ordered IV fluids and a dose of  ceftriaxone  in the ED.  Will continue  with fluids and monitor response to rehydration, will hold off on further antibiotics for now. - Patient will be monitored on progressive unit with slightly worse bradycardia as below, in case this worsens further overnight. - Continue with IV fluids - Hold off on repeat ceftriaxone  pending urine culture results tomorrow - Trend renal function and electrolytes - Trend fever curve and WBC  AKI Uremia > Creatinine elevated to 1.46 baseline 1.1.  BUN significant elevated to 100.  Possibly causing encephalopathy as above. > 1 L ordered in the ED, will continue with IV fluids following this.  Will hold Lasix . - Gentle IV fluids overnight, in the setting of CHF - Trend renal function and electrolytes - Bladder scan given history of BPH.  Hypertension - Hold Lasix , losartan  and spironolactone  in the setting of AKI - Monitor blood pressure  Hyperlipidemia - Continue home atorvastatin   PAD - Continue home atorvastatin , Eliquis   CAD > Status post CABG - Holding losartan  as above - Continue atorvastatin , Eliquis   Paroxysmal atrial fibrillation SVR > History of A-fib and prior SVR.  Previous heart rates in the 50s.  In the ED heart rate has been in the 40s-50s.  Appears to be asymptomatic from this standpoint.  Not on any nodal blocking agents. - Will monitor on progressive unit in case he develops any symptomatic bradycardia in the setting of this acute illness and bradycardia which appears worsened from baseline without any nodal blocking agents contributing. - Continue with Eliquis   History of CVA - Continue home atorvastatin , Eliquis   Depression Anxiety - Continue home Paxil , Seroquel , Remeron   Diabetes - SSI  Anemia - Hemoglobin stable at 10  BPH - Not on medication for this - Will check bladder scan in the setting of AKI.  DVT prophylaxis: Eliquis  Code Status:   Full Family Communication:  None on admission, they were not present  at the time of my exam but EDP reports having spoken with them.   Disposition Plan:   Patient is from:  Home  Anticipated DC to:  Pending clinical course  Anticipated DC date:  1 to 3 days  Anticipated DC barriers: If placement is needed given worsening baseline mental status  Consults called:  None Admission status:  Observation, progressive  Severity of Illness: The appropriate patient status for this patient is OBSERVATION. Observation status is judged to be reasonable and necessary in order to provide the required intensity of service to ensure the patient's safety. The patient's presenting symptoms, physical exam findings, and initial radiographic and laboratory data in the context of their medical condition is felt to place them at decreased risk for further clinical deterioration. Furthermore, it is anticipated that the patient will be medically stable for discharge from the hospital within 2 midnights of admission.    Marsa KATHEE Scurry MD [facility]  How to contact the [facility] Attending or Consulting provider 7A - 7P or covering provider during after hours 7P -7A, for this patient?   Check the care team in [facility] and look for a) attending/consulting [facility] provider listed and b) the [facility] team listed Log into [email or web address] and use [facility]'s universal password to access. If you do not have the password, please contact the hospital operator. Locate the [facility] provider you are looking for under [facility] and page to a number that you can be directly reached. If you still have difficulty reaching the provider, please page the [facility] (Director on Call) for the Hospitalists listed on amion for assistance.  09/12/2023, 3:19 PM

## 2023-09-12 NOTE — Plan of Care (Signed)
  Problem: Education: Goal: Ability to describe self-care measures that may prevent or decrease complications (Diabetes Survival Skills Education) will improve Outcome: Progressing Goal: Individualized Educational Video(s) Outcome: Progressing   Problem: Coping: Goal: Ability to adjust to condition or change in health will improve Outcome: Progressing   Problem: Fluid Volume: Goal: Ability to maintain a balanced intake and output will improve Outcome: Progressing   Problem: Health Behavior/Discharge Planning: Goal: Ability to identify and utilize available resources and services will improve Outcome: Progressing Goal: Ability to manage health-related needs will improve Outcome: Progressing   Problem: Metabolic: Goal: Ability to maintain appropriate glucose levels will improve Outcome: Progressing   Problem: Nutritional: Goal: Maintenance of adequate nutrition will improve Outcome: Progressing Goal: Progress toward achieving an optimal weight will improve Outcome: Progressing   Problem: Tissue Perfusion: Goal: Adequacy of tissue perfusion will improve Outcome: Progressing   Problem: Education: Goal: Knowledge of General Education information will improve Description: Including pain rating scale, medication(s)/side effects and non-pharmacologic comfort measures Outcome: Progressing   Problem: Health Behavior/Discharge Planning: Goal: Ability to manage health-related needs will improve Outcome: Progressing   Problem: Clinical Measurements: Goal: Ability to maintain clinical measurements within normal limits will improve Outcome: Progressing Goal: Will remain free from infection Outcome: Progressing Goal: Diagnostic test results will improve Outcome: Progressing Goal: Respiratory complications will improve Outcome: Progressing Goal: Cardiovascular complication will be avoided Outcome: Progressing   Problem: Activity: Goal: Risk for activity intolerance will  decrease Outcome: Progressing   Problem: Nutrition: Goal: Adequate nutrition will be maintained Outcome: Progressing   Problem: Coping: Goal: Level of anxiety will decrease Outcome: Progressing   Problem: Elimination: Goal: Will not experience complications related to bowel motility Outcome: Progressing Goal: Will not experience complications related to urinary retention Outcome: Progressing   Problem: Pain Managment: Goal: General experience of comfort will improve and/or be controlled Outcome: Progressing   Problem: Safety: Goal: Ability to remain free from injury will improve Outcome: Progressing   Problem: Skin Integrity: Goal: Risk for impaired skin integrity will decrease Outcome: Progressing   Problem: Skin Integrity: Goal: Risk for impaired skin integrity will decrease Outcome: Not Progressing  Patient has painful skin breakdown to scrotum and sacral areas, interventions initiated.

## 2023-09-12 NOTE — ED Notes (Signed)
 Courtesy call given to charge rn of 3w

## 2023-09-12 NOTE — ED Notes (Signed)
 Secure chat Trifan MD due to bradycardia hr nto 40s.

## 2023-09-12 NOTE — ED Notes (Signed)
 In/out cath performed with folely buddy Gaffer. Preemptive care performed.

## 2023-09-12 NOTE — ED Triage Notes (Addendum)
 According to [facility]: Pt is coming from home, he lives by himself, sons girlfriend came to check on him as son is out of town. She found him today covered in feces, house covered in feces, baby powder and flies. Son says he has not been diagnosed with dementia but describes symptoms that would be congruent with it. Ems arrived to pt urinating dingy blood onto his home chuck. Pt found to be alert self, place, time, but not situation. Pt repeats same question over and over, and family reports no recent falls as they have cameras in his house to watch him. Sons girlfriend last saw him at 1800 last night, describes him being weaker and pale.  Sore described on left buttocks, partially covered by bandage.  Vitals: 160/80 Cbg 271 Spo2 100% room air Rr 24 No fever Hr 40-60 afib according to monitor.

## 2023-09-12 NOTE — ED Provider Notes (Signed)
 [facility] EMERGENCY DEPARTMENT AT [facility] Provider Note   CSN: [id number] Arrival date & time: 09/12/23  9059     Patient presents with: No chief complaint on file.   [name] [name] is a 68 y.o. male with a history of paroxysmal A-fib, chronic bilateral pleural effusions, diastolic heart failure, coronary disease and CABG, type 2 diabetes, presenting to the ED with concern for confusion.  Supplemental history provided by EMS.  They report the son's girlfriend saw the patient behaving fairly normally last night 6 PM, but noted this morning that the patient seemed very confused, was found covered in feces and house in disarray.  There is some concern about the patient developing dementia.  The patient himself has no acute complaints.  He is not able to provide a history to me, but says he feels no different than I did yesterday.  He denies headache, chest pain.  EMS reports patient blood sugar 270.  I did review external records.  Patient was discharged in the hospital 1 month ago, in August, admitted at that time for confusion.  He is noted to have a sacral decubitus ulcer.  He was noted to have hallucinations at the time.  He was also diuresed for heart failure.  MRI of the brain performed in February 2025 which noted a small subacute ischemic right frontal stroke.  Also noted chronic microhemorrhage in the left cerebellum and both temporal lobes.  {Add pertinent medical, surgical, social history, OB history to HPI:32947} HPI     Prior to Admission medications   Medication Sig Start Date End Date Taking? Authorizing Provider  acetaminophen  (TYLENOL ) 500 MG tablet Take 500 mg by mouth as needed for mild pain (pain score 1-3) or moderate pain (pain score 4-6).   Yes [provider]  atorvastatin  (LIPITOR) 40 MG tablet Take 1 tablet (40 mg total) by mouth daily. 08/30/23  Yes [name], [name], PA-C  Cholecalciferol  (VITAMIN D3) 50 MCG (2000 UT) capsule Take  2,000 Units by mouth daily.   Yes [provider]  ELIQUIS  5 MG TABS tablet TAKE 1 TABLET BY MOUTH TWICE A DAY 04/19/23  Yes O'Neal, Darryle Ned, MD  furosemide  (LASIX ) 40 MG tablet 40 mg daily alternating with 20 mg daily 08/26/23  Yes Colletta Manuelita Garre, PA-C  glipiZIDE  (GLUCOTROL ) 5 MG tablet Take 1 tablet (5 mg total) by mouth 2 (two) times daily before a meal. Patient taking differently: Take 2.5-5 mg by mouth 2 (two) times daily before a meal. 1qam ssqpm 08/22/23  Yes Shamleffer, Ibtehal Jaralla, MD  loperamide  (IMODIUM ) 2 MG capsule Take 1 capsule (2 mg total) by mouth 4 (four) times daily as needed for diarrhea or loose stools. 01/01/23  Yes Wendolyn Jenkins Jansky, MD  loratadine  (CLARITIN ) 10 MG tablet TAKE 1 TABLET BY MOUTH EVERY DAY Patient taking differently: Take 10 mg by mouth daily as needed for allergies. 05/30/23  Yes [name], [name], PA-C  losartan  (COZAAR ) 25 MG tablet Take 1 tablet (25 mg total) by mouth daily. 08/23/23 09/22/23 Yes Arrien, Elidia Sieving, MD  mirtazapine  (REMERON ) 15 MG tablet TAKE 1 TABLET BY MOUTH EVERYDAY AT BEDTIME 04/23/23  Yes [name], [name], PA-C  OVER THE COUNTER MEDICATION Take 500 mg by mouth daily. Optimal Carnivore - grass fed organ complex, 500mg .   Yes [provider]  PARoxetine  (PAXIL ) 20 MG tablet Take 1 tablet (20 mg total) by mouth daily. 08/30/23  Yes [name], [name], PA-C  QUEtiapine  (SEROQUEL ) 25  MG tablet GIVE 1 TABLET BY MOUTH AT BEDTIME FOR DELIRIUM 06/18/23  Yes [name], [name], PA-C  sitaGLIPtin  (JANUVIA ) 100 MG tablet Take 1 tablet (100 mg total) by mouth daily. 08/08/23  Yes Shamleffer, Ibtehal Jaralla, MD  spironolactone  (ALDACTONE ) 25 MG tablet Take 1 tablet (25 mg total) by mouth daily. 03/15/23 10/12/23 Yes O'Neal, Darryle Ned, MD    Allergies: Other    Review of Systems  Updated Vital Signs BP 136/68   Pulse (!) 47   Temp 97.7 F (36.5 C) (Oral)   Resp 14   Ht 6' 2 (1.88 m)   Wt 63 kg   SpO2 98%    BMI 17.83 kg/m   Physical Exam Constitutional:      General: He is not in acute distress.    Comments: Thin, frail body habitus  HENT:     Head: Normocephalic and atraumatic.  Eyes:     Conjunctiva/sclera: Conjunctivae normal.     Pupils: Pupils are equal, round, and reactive to light.  Cardiovascular:     Rate and Rhythm: Normal rate and regular rhythm.  Pulmonary:     Effort: Pulmonary effort is normal. No respiratory distress.  Abdominal:     General: There is no distension.     Tenderness: There is no abdominal tenderness.  Musculoskeletal:     Comments: Stage II sacral decubitus ulceration  Skin:    General: Skin is warm and dry.  Neurological:     General: No focal deficit present.     Mental Status: He is alert. Mental status is at baseline.     (all labs ordered are listed, but only abnormal results are displayed) Labs Reviewed  CBC WITH DIFFERENTIAL/PLATELET - Abnormal; Notable for the following components:      Result Value   RBC 3.67 (*)    Hemoglobin 10.0 (*)    HCT 33.1 (*)    All other components within normal limits  URINALYSIS, ROUTINE W REFLEX MICROSCOPIC - Abnormal; Notable for the following components:   Color, Urine AMBER (*)    APPearance CLOUDY (*)    Glucose, UA 50 (*)    Hgb urine dipstick MODERATE (*)    Protein, ur 100 (*)    Leukocytes,Ua MODERATE (*)    Bacteria, UA MANY (*)    All other components within normal limits  T4, FREE - Abnormal; Notable for the following components:   Free T4 1.15 (*)    All other components within normal limits  BRAIN NATRIURETIC PEPTIDE - Abnormal; Notable for the following components:   B Natriuretic Peptide 184.1 (*)    All other components within normal limits  COMPREHENSIVE METABOLIC PANEL WITH GFR - Abnormal; Notable for the following components:   Sodium 132 (*)    CO2 19 (*)    Glucose, Bld 263 (*)    BUN 100 (*)    Creatinine, Ser 1.46 (*)    Calcium  8.4 (*)    Albumin 2.6 (*)    Alkaline  Phosphatase 153 (*)    GFR, Estimated 51 (*)    All other components within normal limits  I-STAT CHEM 8, ED - Abnormal; Notable for the following components:   Sodium 132 (*)    BUN 104 (*)    Creatinine, Ser 1.50 (*)    Glucose, Bld 268 (*)    Calcium , Ion 1.13 (*)    TCO2 21 (*)    Hemoglobin 10.9 (*)    HCT 32.0 (*)    All  other components within normal limits  TROPONIN I (HIGH SENSITIVITY) - Abnormal; Notable for the following components:   Troponin I (High Sensitivity) 23 (*)    All other components within normal limits  RESP PANEL BY RT-PCR (RSV, FLU A&B, COVID)  RVPGX2  URINE CULTURE  AMMONIA  TSH  RAPID URINE DRUG SCREEN, HOSP PERFORMED  ETHANOL  TROPONIN I (HIGH SENSITIVITY)    EKG: EKG Interpretation Date/Time:  Thursday September 12 2023 10:06:48 EDT Ventricular Rate:  51 PR Interval:    QRS Duration:  96 QT Interval:  475 QTC Calculation: 438 R Axis:   101  Text Interpretation: Atrial flutter Probable lateral infarct, age indeterminate Confirmed by Cottie Cough [phone]) on 09/12/2023 10:08:49 AM  Radiology: CT Head Wo Contrast Result Date: 09/12/2023 CLINICAL DATA:  Mental status change, unknown cause EXAM: CT HEAD WITHOUT CONTRAST TECHNIQUE: Contiguous axial images were obtained from the base of the skull through the vertex without intravenous contrast. RADIATION DOSE REDUCTION: This exam was performed according to the departmental dose-optimization program which includes automated exposure control, adjustment of the mA and/or kV according to patient size and/or use of iterative reconstruction technique. COMPARISON:  02/02/2023 FINDINGS: Brain: The ventricles appear age appropriate. No mass effect or midline shift. Gray-white differentiation is preserved without focal attenuation abnormality.No evidence of acute territorial infarction, extra-axial fluid collection, hemorrhage, or mass lesion. The basilar cisterns are patent without downward herniation. The  cerebellar hemispheres and vermis are well formed without mass lesion or focal attenuation abnormality. Vascular: No hyperdense vessel. Calcified atherosclerotic plaque within the cavernous/supraclinoid ICA and intradural vertebral arteries. Skull: Normal. Negative for fracture or focal lesion. Sinuses/Orbits: The paranasal sinuses and mastoids are clear.The globes appear intact. No retrobulbar hematoma.Bilateral lens replacements. Other: None. IMPRESSION: No acute intracranial abnormality, specifically, no acute hemorrhage, territorial infarction, or intracranial mass. Electronically Signed   By: Rogelia Myers M.D.   On: 09/12/2023 10:54   DG Chest Portable 1 View Result Date: 09/12/2023 CLINICAL DATA:  68 year old male with bilateral pleural effusions. EXAM: PORTABLE CHEST 1 VIEW COMPARISON:  Portable chest 08/21/2023 and earlier. FINDINGS: Portable AP semi upright view at 1010 hours. Chronic sternotomy and CABG. Stable cardiac size and mediastinal contours. Visualized tracheal air column is within normal limits. Partially loculated bilateral pleural effusions demonstrated on noncontrast CT last month. Confluent and veiling right greater than left lung base opacity has not significantly changed from 08/21/2023. No areas of worsening ventilation. No pneumothorax or edema. Stable visualized osseous structures. IMPRESSION: Partially loculated bilateral pleural effusions not significantly changed from 08/21/2023. No new cardiopulmonary abnormality. Electronically Signed   By: VEAR Hurst M.D.   On: 09/12/2023 10:22    {Document cardiac monitor, telemetry assessment procedure when appropriate:32947} Procedures   Medications Ordered in the ED  cefTRIAXone  (ROCEPHIN ) 1 g in sodium chloride  0.9 % 100 mL IVPB (has no administration in time range)  sodium chloride  0.9 % bolus 1,000 mL (has no administration in time range)      {Click here for ABCD2, HEART and other calculators REFRESH Note before signing:1}                               Medical Decision Making Amount and/or Complexity of Data Reviewed Labs: ordered. Radiology: ordered. ECG/medicine tests: ordered.   This patient presents to the Emergency Department with complaint of altered mental status.  This involves an extensive number of treatment options, and is a complaint that carries with  it a high risk of complications and morbidity.  The differential diagnosis includes hypoglycemia vs metabolic encephalopathy vs infection (including cystitis) vs ICH vs stroke vs polypharmacy vs other  I ordered, reviewed, and interpreted labs, including UA consistent with dehydration.  Uremia noted.  Mild AKI. I ordered medication IV fluids antibiotics for uremia and possible UTI suspect to be related to dehydration I ordered imaging studies which included CT head, dg chest  I independently visualized and interpreted imaging which showed no emergent findings Additional history was obtained from EMS Previous records obtained and reviewed showing hosptial discharge summary Aug 2025 I personally reviewed the patients ECG which showed slow chronic A-fib.  Telemetry shows persistent A-fib with heart rate in the high 40s and low 50s, which appears to be consistent with his prior ED visits this year.  Patient was stable for admission   {Document critical care time when appropriate  Document review of labs and clinical decision tools ie CHADS2VASC2, etc  Document your independent review of radiology images and any outside records  Document your discussion with family members, caretakers and with consultants  Document social determinants of health affecting pt's care  Document your decision making why or why not admission, treatments were needed:32947:::1}   Final diagnoses:  None    ED Discharge Orders     None

## 2023-09-12 NOTE — Plan of Care (Signed)

## 2023-09-12 NOTE — ED Triage Notes (Signed)
 Provider in room, triage will finish after provider assessment.

## 2023-09-13 DIAGNOSIS — G9340 Encephalopathy, unspecified: Secondary | ICD-10-CM | POA: Diagnosis not present

## 2023-09-13 LAB — COMPREHENSIVE METABOLIC PANEL WITH GFR
ALT: 20 U/L (ref 0–44)
AST: 20 U/L (ref 15–41)
Albumin: 2.2 g/dL — ABNORMAL LOW (ref 3.5–5.0)
Alkaline Phosphatase: 131 U/L — ABNORMAL HIGH (ref 38–126)
Anion gap: 10 (ref 5–15)
BUN: 81 mg/dL — ABNORMAL HIGH (ref 8–23)
CO2: 19 mmol/L — ABNORMAL LOW (ref 22–32)
Calcium: 8.2 mg/dL — ABNORMAL LOW (ref 8.9–10.3)
Chloride: 106 mmol/L (ref 98–111)
Creatinine, Ser: 1.08 mg/dL (ref 0.61–1.24)
GFR, Estimated: >60 mL/min (ref >60)
Glucose, Bld: 181 mg/dL — ABNORMAL HIGH (ref 70–99)
Potassium: 4.5 mmol/L (ref 3.5–5.1)
Sodium: 135 mmol/L (ref 135–145)
Total Bilirubin: 0.5 mg/dL (ref 0.0–1.2)
Total Protein: 6.3 g/dL — ABNORMAL LOW (ref 6.5–8.1)

## 2023-09-13 LAB — GLUCOSE, CAPILLARY
Glucose-Capillary: 165 mg/dL — ABNORMAL HIGH (ref 70–99)
Glucose-Capillary: 205 mg/dL — ABNORMAL HIGH (ref 70–99)
Glucose-Capillary: 200 mg/dL — ABNORMAL HIGH (ref 70–99)
Glucose-Capillary: 148 mg/dL — ABNORMAL HIGH (ref 70–99)

## 2023-09-13 LAB — CBC
HCT: 28.2 % — ABNORMAL LOW (ref 39.0–52.0)
Hemoglobin: 9 g/dL — ABNORMAL LOW (ref 13.0–17.0)
MCH: 27.5 pg (ref 26.0–34.0)
MCHC: 31.9 g/dL (ref 30.0–36.0)
MCV: 86.2 fL (ref 80.0–100.0)
Platelets: 204 K/uL (ref 150–400)
RBC: 3.27 MIL/uL — ABNORMAL LOW (ref 4.22–5.81)
RDW: 15.6 % — ABNORMAL HIGH (ref 11.5–15.5)
WBC: 6.8 K/uL (ref 4.0–10.5)
nRBC: 0 % (ref 0.0–0.2)

## 2023-09-13 MED ORDER — AMLODIPINE BESYLATE 5 MG PO TABS: 5.0000 mg | ORAL_TABLET | Freq: Every day | ORAL | Status: DC

## 2023-09-13 MED ORDER — SODIUM CHLORIDE 0.9 % IV SOLN: INTRAVENOUS | Status: AC

## 2023-09-13 MED ORDER — LOSARTAN POTASSIUM 50 MG PO TABS
50.0000 mg | ORAL_TABLET | Freq: Every day | ORAL | Status: DC
  Administered 2023-09-14 – 2023-09-16 (×3): 50 mg via ORAL
  Filled 2023-09-13 (×3): qty 1

## 2023-09-13 MED ORDER — LOSARTAN POTASSIUM 25 MG PO TABS: 12.5000 mg | ORAL_TABLET | Freq: Every day | ORAL | Status: DC

## 2023-09-13 NOTE — TOC CM/SW Note (Signed)
 Transition of Care [facility]) - Inpatient Brief Assessment   Patient Details  Name: [name] [name] MRN: [id number] Date of Birth: September 11, 1955  [facility] CM/SW Contact:    Tom-Johnson, Zia Najera Daphne, RN Phone Number: 09/13/2023, 1:20 PM   Clinical Narrative:  Patient presented t the ED with Altered Mental Status, admitted with Acute Encephalopathy.  Patient was found at home covered in feces by his son's girlfriend. Has hx of T2DM, CAD s/p CABG, A-Fib, HTN, HLD, PAD, CVA, CHF, Sacral decub, Anxiety, Depression, Aneurysm Repair, Anemia and BPH. Completed IV abx.   CM spoke with patient at bedside about needs for post hospital transition. Patient states he lives alone, has four children. Son, [name] and his girlfriend visits frequently and assists with patient's care. Has a rollator and shower seat at home. [name] transports to and from appointments.  PCP is [name], Mardy HERO, PA-C and uses [facility] on [address].   Patient not Medically ready for discharge.  CM will continue to follow as patient progresses with care towards discharge.           Transition of Care Asessment: Insurance and Status: Insurance coverage has been reviewed Patient has primary care physician: Yes Home environment has been reviewed: Yes Prior level of function:: Modified Independent Prior/Current Home Services: No current home services Social Drivers of Health Review: SDOH reviewed no interventions necessary Readmission risk has been reviewed: Yes Transition of care needs: transition of care needs identified, TOC will continue to follow

## 2023-09-13 NOTE — Plan of Care (Signed)

## 2023-09-13 NOTE — Consult Note (Signed)
 WOC Nurse Consult Note: Remote using photodocumentation wounds to toes and buttock; Sacrum Reason for Consult: POA PI Wound type: Pressure related to Sacrum Pressure Injury POA: Yes Measurement: approximally 5x6x.25  Wound bed: red Drainage (amount, consistency, odor)  Periwound: dusky red to maroon-purple, may get darker, now with some epidermal lifting of tissue mostly over the left sacrum, can widen and progress to another PI stage, appearance of fragile tissue along gluteal cleft, bony sacrum, erythema extending towards rectal area Dressing procedure/placement/frequency:  Cleanse with normal saline, pat dry and apply 2 layers of Xeroform yellow guaze over wound, cover with Sacrum Mepilex foam dressing, change out foam dressing every 3 days. Xeroform BID  Wound type: Left lateral foot Measurement: 2x3 Wound bed: dusky-red, some tissue sloughing, darker purple area starting along the cuboid, up along 5th metatarsal  Drainage (amount, consistency, odor)  Periwound: dusky; scar tissue  Wound type: Right foot; 5th toe lateral, possible DTPI in development Measurement: 2x2 Wound bed: discoloration over intact skin, nail appears intact but with discoloration Drainage (amount, consistency, odor) none noted on image Periwound: maroon discoloration could worsen and darken in color. Both foot lesions could be related to vascular dysfunction given the patients medical history.  Dressing procedure/placement/frequency:  Right and Left Foot; Toes DTPI vs Vascular Dysfunction lesion Cleanse with normal saline, pat dry with gauze, apply 3x3 Mepilex foam dressing to protect and pad each lesion, staff to monitor lesions every shift.  Buttock; Sacrum, images taken by staff  Left lateral foot  Right Foot 5th toe   WOC will not follow and will remove patient from census task list. Please reconsult if wound worsens in condition and notify provider.   Sherrilyn Hals MSN RN CWOCN WOC [facility]  [phone] (Available from 7-3 pm Mon-Friday)

## 2023-09-13 NOTE — Care Management Obs Status (Signed)
 MEDICARE OBSERVATION STATUS NOTIFICATION   Patient Details  Name: [name] [name] MRN: [id number] Date of Birth: December 21, 1955   Medicare Observation Status Notification Given:       Claretta Deed reviewed observation notice with Ethel Saba telephonically at [phone] will send copy to home address

## 2023-09-13 NOTE — Progress Notes (Signed)
 TRH   ROUNDING   NOTE [name] [name] FMW:[id number]  DOB: Aug 18, 1955  DOA: 09/12/2023  PCP: [name] HERO, PA-C  09/13/2023,11:13 AM  LOS: 0 days    Code Status: Full     from: Apparently home   68 year old white male Chronic A-fib with slow ventricular response CHADVASC 4 on apixaban , CABG X4 2003-aortic aneurysm repair 2003 CVA 11/21/2022 (noncompliance with Eliquis )--I have to repeat my discussion with DM2 by 2 HLD HFpEF EF 55-60%-baseline NYHA III Sacral decubitus ulcer at baseline He also does have chronic fecal incontinence and has colonoscopy scheduled with Dr. Leigh 10/14 Recent retinal surgery within the past 2 months and cannot drive Recent hospitalization 8/17 through 08/22/2023 with AECHF, bilateral pleural effusions was placed on GDMT Aldactone  losartan  and continued on home Lasix  He saw primary care 8/28 and was told to follow-up for repeat kidney tests on 9/4--his office weight was 147 pounds discharge wght 138    Assessment  & Plan :    AKI 2/2 ATN on admission secondary to GDMT Holding GDMT, IV fluid 50 cc/Hr x 24 hours then reassess volume status, Daily weights, standing if possible, strict I/O Involve cardiology for med management--maintain on only statin at this time PTA meds that are culprits for AKI include losartan  25 Aldactone  25 and variable dosing of Lasix  Chronic A-fib slow RVR on apixaban  Baseline bradycardia Not on rate control Continue Eliquis  5 twice daily CABG X4, aortic aneurysmal repair 2003 Continue Lipitor 40-LFTs, lipid panel in 1 to 2 months Chronic HFpEF EF 55-60 NYHA III at baseline See above discussion Per cards DMT by 2 Would not use Januvia  in the future-hesitate to use metformin May resume glipizide  2.5 a.m. only depending on blood sugars Continue sliding scale here alone-sugars are reasonably well-controlled below 210 Chronic fecal incontinence iron  deficiency anemia Stop Imodium , keep appointment with GI Dr. Leigh  10/15 for colonoscopy Depression May need to renally dose Remeron  15 Paxil  20 and Seroquel  25 Chronic sacral decubitus History of PAD and ulcers to foot dressings as per wound care nurse appreciate her input Areas on legs seem pressure injury no necrosis or gangrene based on pictures in chart as below Legal blindness Previous history of CVA in the setting of Eliquis  noncompliance Has cognitive issues and refuses care at times Therapy to see Needs MOcA testing as well--Neuro follow-up Might ask neuro to see for Parkinson's as OP   Called to discussed with patient's son [name] [phone]    Data Reviewed:   Sodium 132 potassium 4.9 BUN/creatinine 100/1.4 Alk phos 153 AST/ALT 22/22 Hemoglobin 9.0 WBC 6.8 platelet 204   DVT prophylaxis: Eliquis   Status is: Observation The patient will require care spanning > 2 midnights and should be moved to inpatient because:   May require skilled     Current Dispo: Inpatient may require skilled     Subjective:    Awake coherent but a little confused about sequence of events   Objective + exam Vitals:   09/12/23 2001 09/12/23 2332 09/13/23 0357 09/13/23 0826  BP: (!) 136/51 (!) 176/89 (!) 143/76 (!) 148/71  Pulse:  (!) 57 60 (!) 58  Resp:  18 18 17   Temp: 98.3 F (36.8 C) 97.6 F (36.4 C) 97.7 F (36.5 C) 97.8 F (36.6 C)  TempSrc: Oral Oral Oral Oral  SpO2: 96% 100% 98% 100%  Weight:      Height:       Filed Weights   09/12/23 0952  Weight: 63 kg  Examination:  Eomi ncat no focal deficit Cta b no wheeze Rom intact Power 5/5 Abd soft nt nd no rebound no guard Decubiti not visualized today         Scheduled Meds:  apixaban   5 mg Oral BID   atorvastatin   40 mg Oral Daily   insulin  aspart  0-9 Units Subcutaneous TID WC   mirtazapine   15 mg Oral QHS   PARoxetine   20 mg Oral Daily   QUEtiapine   25 mg Oral QHS   sodium chloride  flush  3 mL Intravenous Q12H   Continuous Infusions:  Time   60  Jai-Gurmukh Ulysess Witz, MD  [facility]

## 2023-09-13 NOTE — Consult Note (Addendum)
 Cardiology Consultation  Patient ID: [name] [name] MRN: [id number]; DOB: 1955-11-09  Admit date: 09/12/2023 Date of Consult: 09/13/2023  PCP:  [name] HERO, PA-C   [facility] Providers Cardiologist:  Darryle ONEIDA Decent, MD     Patient Profile: [name] [name] is a 68 y.o. male with a hx of PAD, CAD s/p CABG x 3 in 2003, history of aortic dissection s/p aortic aneurysm repair in 2003, diabetes, hypertension, HFpEF, paroxysmal atrial fibrillation, bilateral pleural effusions chronically who is being seen 09/13/2023 for the evaluation of HFpEF at the request of Dr. Royal.  History of Present Illness: [name] [name] has past medical history as listed above.  He presented to the [facility] emergency department on 09/12/2023 for altered mental status.  There have been concerns for possibly progressing dementia, he has never been formally diagnosed.  Chart review states that the son typically checks on him, and they have installed cameras in the house to help monitor him when he is at home alone.  Apparently his son was out of town this weekend so the son's girlfriend went to check on him today and the patient was found covered in feces with feces and baby powder all throughout the house as well.  He has been admitted to the medicine service for acute encephalopathy, uremia, AKI.  Cardiology was asked to consult in the setting of HFpEF.  [name] [name] is a patient of Dr. Decent, but was last seen by [name], NP on 08/01/2023 for preoperative evaluation.  At this appointment in regards to his HFpEF his medication regimen included: Lasix  20 mg daily, spironolactone  25 mg daily.  It was noted that he appeared euvolemic and well compensated at this visit, noting his weight was 68.9 kg.  In regards to his atrial fibrillation, he was noted to be rate controlled with a heart rate of 60, he was continued on Eliquis  5 mg twice daily at this time.  He was on no rate/rhythm control at  this time.  Relevant workup while in the ED/since admitted includes: CMP initially showed hyponatremia, resolved, initial blood sugar was 268 on presentation, creatinine was one 1.5 on admission, down to 1.08 today, drug screen negative,BNP mildly elevated at 184, compared to 192 weeks prior, troponin mildly elevated and flat 23 ? 24, TSH normal.  CXR showed loculated bilateral pleural effusions not significantly changed from prior imaging, CT head showed no acute intracranial abnormality.  After speaking with the patient, he tells me he is not sure exactly what brought him into the hospital.  He states that he does know his son was away in [place]  on a hiking trip when typically his son, who lives next-door, typically checks on him frequently.  He believes that his son's girlfriend found him and brought him into the hospital, he has no memory of these events.  He tells me leading up to this event he had no issues with his medications.  He tells me that his son typically manages his medications, putting them into a pillbox for him to take for the week.  He denies any recent chest pain, shortness of breath, lower extremity edema.  He tells me that to his memory he has no recent issues with any medications.  He does admit that his memory is not as good.  As noted previously, his family has expressed concerns about potentially developing dementia although he has not been formally diagnosed yet.    Past Medical History:  Diagnosis Date   Acute  urinary retention 01/04/2022   Altered mental status 08/18/2023   Anxiety    Arthritis    Cataract    CHF (congestive heart failure) (HCC)    Constipation    pt states goes EOD- hard stools    Coronary artery disease    Depression    Diabetes mellitus without complication (HCC)    Diabetic retinopathy (HCC)    DKA (diabetic ketoacidoses)    Hyperlipidemia    Hypertension    off meds   Hypertensive retinopathy    Lesion of ulnar nerve, left upper limb  07/12/2017   Non-intractable vomiting without nausea 06/10/2018   Postherpetic neuralgia 08/11/2021   Speculative diagnosis   Psoriasis    Tubular adenoma of colon 03/2019   UTI (urinary tract infection) 01/05/2022   Past Surgical History:  Procedure Laterality Date   ABDOMINAL AORTOGRAM W/LOWER EXTREMITY N/A 08/30/2020   Procedure: ABDOMINAL AORTOGRAM W/LOWER EXTREMITY;  Surgeon: Serene Gaile ORN, MD;  Location: [facility] INVASIVE CV [facility];  Service: Cardiovascular;  Laterality: N/A;   ABDOMINAL AORTOGRAM W/LOWER EXTREMITY N/A 11/15/2020   Procedure: ABDOMINAL AORTOGRAM W/LOWER EXTREMITY;  Surgeon: Serene Gaile ORN, MD;  Location: [facility]VAS[facility] CV [facility];  Service: Cardiovascular;  Laterality: N/A;   ANKLE SURGERY     right   CARDIAC CATHETERIZATION  09/25/2001   COLONOSCOPY     ~10 yr ago- normal  per pt    CORONARY ARTERY BYPASS GRAFT  09/30/2001   CABG x 4   IR THORACENTESIS ASP PLEURAL SPACE W/IMG GUIDE  05/19/2020   IR THORACENTESIS ASP PLEURAL SPACE W/IMG GUIDE  02/04/2023   LAPAROSCOPIC INGUINAL HERNIA REPAIR Bilateral 02/09/2010   MASS EXCISION Left 03/12/2013   Procedure: LEFT INDEX EXCISION MASS AND DEBRIDMENT DISTAL INTERPHALANGEAL JOINT;  Surgeon: Franky JONELLE Curia, MD;  Location: [facility];  Service: Orthopedics;  Laterality: Left;   PERIPHERAL VASCULAR BALLOON ANGIOPLASTY Left 08/30/2020   Procedure: PERIPHERAL VASCULAR BALLOON ANGIOPLASTY;  Surgeon: Serene Gaile ORN, MD;  Location: [facility];  Service: Cardiovascular;  Laterality: Left;  Peroneal   TRANSESOPHAGEAL ECHOCARDIOGRAM (CATH LAB) N/A 11/26/2022   Procedure: TRANSESOPHAGEAL ECHOCARDIOGRAM;  Surgeon: Kate Lonni CROME, MD;  [facility] INVASIVE CV LAB;  Service: Cardiovascular;  Laterality: N/A;    Home Medications:  Prior to Admission medications   Medication Sig Start Date End Date Taking? Authorizing Provider  acetaminophen  (TYLENOL ) 500 MG tablet Take 500 mg by mouth as needed for mild pain (pain  score 1-3) or moderate pain (pain score 4-6).   Yes [provider]  atorvastatin  (LIPITOR) 40 MG tablet Take 1 tablet (40 mg total) by mouth daily. 08/30/23  Yes [name], [name], PA-C  Cholecalciferol  (VITAMIN D3) 50 MCG (2000 UT) capsule Take 2,000 Units by mouth daily.   Yes [provider]  ELIQUIS  5 MG TABS tablet TAKE 1 TABLET BY MOUTH TWICE A DAY 04/19/23  Yes O'Neal, Darryle Ned, MD  furosemide  (LASIX ) 40 MG tablet 40 mg daily alternating with 20 mg daily 08/26/23  Yes Colletta Manuelita Garre, PA-C  glipiZIDE  (GLUCOTROL ) 5 MG tablet Take 1 tablet (5 mg total) by mouth 2 (two) times daily before a meal. Patient taking differently: Take 2.5-5 mg by mouth 2 (two) times daily before a meal. 1qam ssqpm 08/22/23  Yes Shamleffer, Ibtehal Jaralla, MD  loperamide  (IMODIUM ) 2 MG capsule Take 1 capsule (2 mg total) by mouth 4 (four) times daily as needed for diarrhea or loose stools. 01/01/23  Yes Wendolyn Jenkins Jansky, MD  loratadine  (CLARITIN ) 10 MG tablet TAKE 1 TABLET BY MOUTH EVERY DAY Patient taking differently: Take 10 mg by mouth daily as needed for allergies. 05/30/23  Yes [name], [name], PA-C  losartan  (COZAAR ) 25 MG tablet Take 1 tablet (25 mg total) by mouth daily. 08/23/23 09/22/23 Yes Arrien, Elidia Sieving, MD  mirtazapine  (REMERON ) 15 MG tablet TAKE 1 TABLET BY MOUTH EVERYDAY AT BEDTIME 04/23/23  Yes [name], [name], PA-C  OVER THE COUNTER MEDICATION Take 500 mg by mouth daily. Optimal Carnivore - grass fed organ complex, 500mg .   Yes [provider]  PARoxetine  (PAXIL ) 20 MG tablet Take 1 tablet (20 mg total) by mouth daily. 08/30/23  Yes [name], [name], PA-C  QUEtiapine  (SEROQUEL ) 25 MG tablet GIVE 1 TABLET BY MOUTH AT BEDTIME FOR DELIRIUM 06/18/23  Yes [name], [name], PA-C  sitaGLIPtin  (JANUVIA ) 100 MG tablet Take 1 tablet (100 mg total) by mouth daily. 08/08/23  Yes Shamleffer, Ibtehal Jaralla, MD  spironolactone  (ALDACTONE ) 25 MG tablet Take 1 tablet (25  mg total) by mouth daily. 03/15/23 10/12/23 Yes O'Neal, Darryle Ned, MD   Scheduled Meds:  amLODipine   5 mg Oral Daily   apixaban   5 mg Oral BID   atorvastatin   40 mg Oral Daily   insulin  aspart  0-9 Units Subcutaneous TID WC   losartan   12.5 mg Oral Daily   mirtazapine   15 mg Oral QHS   PARoxetine   20 mg Oral Daily   QUEtiapine   25 mg Oral QHS   sodium chloride  flush  3 mL Intravenous Q12H   Continuous Infusions:  sodium chloride  50 mL/hr at 09/13/23 1222   PRN Meds: acetaminophen  **OR** acetaminophen , mouth rinse, polyethylene glycol  Allergies:    Allergies  Allergen Reactions   Other Hives and Itching    Wool fabric - hives, itching   Social History:   Social History   Socioeconomic History   Marital status: Divorced    Spouse name: Not on file   Number of children: 4   Years of education: 12   Highest education level: 12th grade  Occupational History   Occupation: Retired  Tobacco Use   Smoking status: Some Days    Types: Cigars    Last attempt to quit: 2022    Years since quitting: 3.6   Smokeless tobacco: Never   Tobacco comments:    1-2 per week cigars  05/09/2023  Vaping Use   Vaping status: Never Used  Substance and Sexual Activity   Alcohol use: Not Currently    Comment: about once a year   Drug use: No   Sexual activity: Yes  Other Topics Concern   Not on file  Social History Narrative   Lives with his adult son [name].   Has 4 children, 2 sons- both live in [place], 2 daughters one lives in [place], [place]  and youngest daughter attends [employer].    Fiance lives in  [place], [place]   Social Drivers of Health   Financial Resource Strain: Low Risk  (08/19/2023)   Overall Financial Resource Strain (CARDIA)    Difficulty of Paying Living Expenses: Not hard at all  Food Insecurity: No Food Insecurity (09/12/2023)   Hunger Vital Sign    Worried About Running Out of Food in the Last Year: Never true    Ran Out of Food in the Last  Year: Never true  Recent Concern: Food Insecurity - Food Insecurity Present (08/18/2023)   Hunger Vital Sign    Worried About  Running Out of Food in the Last Year: Sometimes true    Ran Out of Food in the Last Year: Sometimes true  Transportation Needs: No Transportation Needs (09/12/2023)   PRAPARE - Administrator, Civil Service (Medical): No    Lack of Transportation (Non-Medical): No  Physical Activity: Inactive (11/21/2022)   Exercise Vital Sign    Days of Exercise per Week: 0 days    Minutes of Exercise per Session: 0 min  Stress: Stress Concern Present (11/21/2022)   [employer] of Occupational Health - Occupational Stress Questionnaire    Feeling of Stress : Very much  Social Connections: Socially Isolated (09/12/2023)   Social Connection and Isolation Panel    Frequency of Communication with Friends and Family: Once a week    Frequency of Social Gatherings with Friends and Family: Once a week    Attends Religious Services: Never    Database administrator or Organizations: No    Attends Banker Meetings: Never    Marital Status: Divorced  Catering manager Violence: Not At Risk (09/12/2023)   Humiliation, Afraid, Rape, and Kick questionnaire    Fear of Current or Ex-Partner: No    Emotionally Abused: No    Physically Abused: No    Sexually Abused: No    Family History:   Family History  Problem Relation Age of Onset   Lymphoma Father    Cancer Paternal Grandmother        Colon Cancer   Colon cancer Paternal Grandmother    Colon polyps Neg Hx    Esophageal cancer Neg Hx    Rectal cancer Neg Hx    Stomach cancer Neg Hx    Diabetes Mellitus I Neg Hx    Pancreatic cancer Neg Hx     ROS:  Please see the history of present illness.  All other ROS reviewed and negative.     Physical Exam/Data: Vitals:   09/12/23 2332 09/13/23 0357 09/13/23 0826 09/13/23 1123  BP: (!) 176/89 (!) 143/76 (!) 148/71 (!) 134/50  Pulse: (!) 57 60 (!) 58 66   Resp: 18 18 17 18   Temp: 97.6 F (36.4 C) 97.7 F (36.5 C) 97.8 F (36.6 C) 98.3 F (36.8 C)  TempSrc: Oral Oral Oral Oral  SpO2: 100% 98% 100% 99%  Weight:      Height:        Intake/Output Summary (Last 24 hours) at 09/13/2023 1426 Last data filed at 09/13/2023 1126 Gross per 24 hour  Intake 2202 ml  Output 1300 ml  Net 902 ml      09/12/2023    9:52 AM 08/30/2023   10:50 AM 08/29/2023    9:58 AM  Last 3 Weights  Weight (lbs) 138 lb 14.2 oz 139 lb 147 lb 12.8 oz  Weight (kg) 63 kg 63.05 kg 67.042 kg     Body mass index is 17.83 kg/m.   General:  in no acute distress, on room air HEENT: normal Neck: no JVD Vascular: Distal pulses 2+ bilaterally Cardiac:  normal S1, S2; bradycardic; no murmur  Lungs:  clear to auscultation bilaterall Abd: soft, nontender, no hepatomegaly  Ext: no edema Musculoskeletal:  No deformities Skin: warm and dry  Neuro:  no focal abnormalities noted Psych:  Normal affect   Telemetry:  Telemetry was personally reviewed and demonstrates: Atrial fibrillation, slow RVR, HR primarily under 60  Relevant CV Studies:  Echocardiogram, 08/19/2023 Left ventricular ejection fraction, by estimation, is 55 to 60% .  The left ventricle has normal function. The left ventricle has no regional wall motion abnormalities. There is mild asymmetric left ventricular hypertrophy of the basal- septal segment. Left ventricular diastolic parameters are consistent with Grade II diastolic dysfunction ( pseudonormalization) Right ventricular systolic function is normal. The right ventricular size is normal. Tricuspid regurgitation signal is inadequate for assessing PA pressure.  The mitral valve is normal in structure. Trivial mitral valve regurgitation. No evidence of mitral stenosis.  The aortic valve is tricuspid. There is moderate calcification of the aortic valve. Aortic valve regurgitation is not visualized. Aortic valve sclerosis/ calcification is present, without any  evidence of aortic stenosis. The inferior vena cava is dilated in size with < 50% respiratory variability, suggesting right atrial pressure of 15 mmHg.  Left pleural effusion noted.  Laboratory Data: High Sensitivity Troponin:   Recent Labs  Lab 08/18/23 1933 09/12/23 1157 09/12/23 1159  TROPONINIHS 16 23* 24*     Chemistry Recent Labs  Lab 09/12/23 1150 09/12/23 1156 09/13/23 0203  NA 132* 132* 135  K 4.9 4.9 4.5  CL 100 100 106  CO2  --  19* 19*  GLUCOSE 268* 263* 181*  BUN 104* 100* 81*  CREATININE 1.50* 1.46* 1.08  CALCIUM   --  8.4* 8.2*  GFRNONAA  --  51* >60  ANIONGAP  --  13 10    Recent Labs  Lab 09/12/23 1156 09/13/23 0203  PROT 7.0 6.3*  ALBUMIN 2.6* 2.2*  AST 22 20  ALT 22 20  ALKPHOS 153* 131*  BILITOT 0.6 0.5   Lipids No results for input(s): CHOL, TRIG, HDL, LABVLDL, LDLCALC, CHOLHDL in the last 168 hours.  Hematology Recent Labs  Lab 09/12/23 1150 09/12/23 1157 09/13/23 0203  WBC  --  8.0 6.8  RBC  --  3.67* 3.27*  HGB 10.9* 10.0* 9.0*  HCT 32.0* 33.1* 28.2*  MCV  --  90.2 86.2  MCH  --  27.2 27.5  MCHC  --  30.2 31.9  RDW  --  15.5 15.6*  PLT  --  228 204   Thyroid   Recent Labs  Lab 09/12/23 1157 09/12/23 1158  TSH  --  2.618  FREET4 1.15*  --     BNP Recent Labs  Lab 09/12/23 1200  BNP 184.1*    DDimer No results for input(s): DDIMER in the last 168 hours.  Radiology/Studies:  CT Head Wo Contrast Result Date: 09/12/2023 CLINICAL DATA:  Mental status change, unknown cause EXAM: CT HEAD WITHOUT CONTRAST TECHNIQUE: Contiguous axial images were obtained from the base of the skull through the vertex without intravenous contrast. RADIATION DOSE REDUCTION: This exam was performed according to the departmental dose-optimization program which includes automated exposure control, adjustment of the mA and/or kV according to patient size and/or use of iterative reconstruction technique. COMPARISON:  02/02/2023 FINDINGS:  Brain: The ventricles appear age appropriate. No mass effect or midline shift. Gray-white differentiation is preserved without focal attenuation abnormality.No evidence of acute territorial infarction, extra-axial fluid collection, hemorrhage, or mass lesion. The basilar cisterns are patent without downward herniation. The cerebellar hemispheres and vermis are well formed without mass lesion or focal attenuation abnormality. Vascular: No hyperdense vessel. Calcified atherosclerotic plaque within the cavernous/supraclinoid ICA and intradural vertebral arteries. Skull: Normal. Negative for fracture or focal lesion. Sinuses/Orbits: The paranasal sinuses and mastoids are clear.The globes appear intact. No retrobulbar hematoma.Bilateral lens replacements. Other: None. IMPRESSION: No acute intracranial abnormality, specifically, no acute hemorrhage, territorial infarction, or intracranial mass. Electronically Signed  By: Rogelia Myers M.D.   On: 09/12/2023 10:54   DG Chest Portable 1 View Result Date: 09/12/2023 CLINICAL DATA:  68 year old male with bilateral pleural effusions. EXAM: PORTABLE CHEST 1 VIEW COMPARISON:  Portable chest 08/21/2023 and earlier. FINDINGS: Portable AP semi upright view at 1010 hours. Chronic sternotomy and CABG. Stable cardiac size and mediastinal contours. Visualized tracheal air column is within normal limits. Partially loculated bilateral pleural effusions demonstrated on noncontrast CT last month. Confluent and veiling right greater than left lung base opacity has not significantly changed from 08/21/2023. No areas of worsening ventilation. No pneumothorax or edema. Stable visualized osseous structures. IMPRESSION: Partially loculated bilateral pleural effusions not significantly changed from 08/21/2023. No new cardiopulmonary abnormality. Electronically Signed   By: VEAR Hurst M.D.   On: 09/12/2023 10:22   Assessment and Plan:  Chronic HFpEF Hypertension  Known history of heart  failure Home meds: Lasix  20 mg and 40 mg alternating every other day, spironolactone  25 mg daily, losartan  25 mg daily Echo from 08/09/2023 showed: LVEF 55 to 60%, G2 DD, normal RV function, dilated IVC Home meds have been in the setting of AKI Cardiology asked consult in the setting of med management Creatinine improving 1.50 ? 1.08 today  Patient appears euvolemic on exam He reports no recent CHF symptoms, no shortness of breath, edema, chest pain Concern for compliance with medications given pressure injuries, living conditions, possible dementia -- would prefer to stay away from diuretics   Increase losartan  to 50 mg for BP control Continue to monitor renal function   Altered mental status  Patient hospitalized in August due to AMS Patient presented to ED this admission for AMS Patient tells me he lives independently however his son is close by and checks on him frequently Due to state he was found prior to this admission, chronic ulcers, needing of management of chronic conditions, would likely benefit from some sort of skilled nursing facility at discharge There was not family present in the room to discuss this with her at the time Encourage primary team to discuss this with the patient and his family  Persistent atrial fibrillation slow RVR Home meds: Eliquis  5 mg BID Typically remains in atrial fibrillation Not on any antiarrhythmic/rate control at home Baseline HR 50s to 60s Currently in atrial fibrillation with HR 50 to 60s Patient denies any missed doses of Eliquis , stressed the importance of continued compliance Continue Eliquis   Per primary Diabetes Altered mental status AKI Decubitus ulcers PAD History of CVA CAD s/p CABG Hyperlipidemia Chronic bilateral pleural effusions  Risk Assessment/Risk Scores:      [place]  Heart Association (NYHA) Functional Class NYHA Class I   For questions or updates, please contact [facility] Please consult  [email or web address] for contact info under      Signed, Waddell DELENA Donath, PA-C  09/13/2023 2:26 PM  Pt seen and examined   I agree with findings as noted by T Parcells above  Pt is a 68 yo who is followed by LELON Merles in clinic Hx of aortic dissection (s/p repair 2003) CAD with CABG in 2003 , DM, HTN, HFpEF, PAD and PAF (on Eliquis )  ALso a hx of recurrent pleural effusion.   Felt due to hypoalbuminemia   Being evaluated for elevated alk phos by PCP  Recent echo in Aug 2025 LVEF 55 to 60%  RVEF normal  Pt admitted on 9.11 with altered mental status   Pt found home covered in feces  Since admit he has  been getting hydrated with NSi intravenously    He continues on losartan  but other BP meds from home on hold (furosemide , aldactone )  Pt currently comfortable in bed   Laying flat Neck  JVP is normal Lungs are CTA Card   Irreg irreg  II/VI systolic murmur  Abd is benign Ext without edema  Warm  1  Hx HFpEF I do not think volume overloaded   Was dry and is improving with IV fluids    Given how he was found I would try to minimize use of diuretic    Follow volume   2  HTN   BP is labile, minimally elevated     Increase losartan  to 50  Follow  3  CAD   No symptoms of angina  4   Afib   Keep on Eliquis  though if he keeps having episodes lke current presentation may need to reconsider    Pt has appt for 09/26/23  Please keep this Will sign off   Please call with questions

## 2023-09-14 ENCOUNTER — Other Ambulatory Visit: Payer: Self-pay | Admitting: Physician Assistant

## 2023-09-14 DIAGNOSIS — G9340 Encephalopathy, unspecified: Secondary | ICD-10-CM | POA: Diagnosis not present

## 2023-09-14 LAB — CBC WITH DIFFERENTIAL/PLATELET
Abs Immature Granulocytes: 0.02 K/uL (ref 0.00–0.07)
Basophils Absolute: 0 K/uL (ref 0.0–0.1)
Basophils Relative: 1 %
Eosinophils Absolute: 0.2 K/uL (ref 0.0–0.5)
Eosinophils Relative: 3 %
HCT: 26.9 % — ABNORMAL LOW (ref 39.0–52.0)
Hemoglobin: 8.2 g/dL — ABNORMAL LOW (ref 13.0–17.0)
Immature Granulocytes: 0 %
Lymphocytes Relative: 16 %
Lymphs Abs: 1 K/uL (ref 0.7–4.0)
MCH: 27.2 pg (ref 26.0–34.0)
MCHC: 30.5 g/dL (ref 30.0–36.0)
MCV: 89.4 fL (ref 80.0–100.0)
Monocytes Absolute: 0.6 K/uL (ref 0.1–1.0)
Monocytes Relative: 10 %
Neutro Abs: 4.5 K/uL (ref 1.7–7.7)
Neutrophils Relative %: 70 %
Platelets: 208 K/uL (ref 150–400)
RBC: 3.01 MIL/uL — ABNORMAL LOW (ref 4.22–5.81)
RDW: 15.4 % (ref 11.5–15.5)
WBC: 6.3 K/uL (ref 4.0–10.5)
nRBC: 0 % (ref 0.0–0.2)

## 2023-09-14 LAB — COMPREHENSIVE METABOLIC PANEL WITH GFR
ALT: 21 U/L (ref 0–44)
AST: 22 U/L (ref 15–41)
Albumin: 2.1 g/dL — ABNORMAL LOW (ref 3.5–5.0)
Alkaline Phosphatase: 119 U/L (ref 38–126)
Anion gap: 6 (ref 5–15)
BUN: 56 mg/dL — ABNORMAL HIGH (ref 8–23)
CO2: 20 mmol/L — ABNORMAL LOW (ref 22–32)
Calcium: 8.2 mg/dL — ABNORMAL LOW (ref 8.9–10.3)
Chloride: 109 mmol/L (ref 98–111)
Creatinine, Ser: 1.1 mg/dL (ref 0.61–1.24)
GFR, Estimated: >60 mL/min (ref >60)
Glucose, Bld: 150 mg/dL — ABNORMAL HIGH (ref 70–99)
Potassium: 4.5 mmol/L (ref 3.5–5.1)
Sodium: 135 mmol/L (ref 135–145)
Total Bilirubin: 0.5 mg/dL (ref 0.0–1.2)
Total Protein: 6 g/dL — ABNORMAL LOW (ref 6.5–8.1)

## 2023-09-14 LAB — GLUCOSE, CAPILLARY
Glucose-Capillary: 138 mg/dL — ABNORMAL HIGH (ref 70–99)
Glucose-Capillary: 150 mg/dL — ABNORMAL HIGH (ref 70–99)
Glucose-Capillary: 193 mg/dL — ABNORMAL HIGH (ref 70–99)

## 2023-09-14 NOTE — TOC CM/SW Note (Addendum)
 PT to evaluate pt. Will f/u with their recommendations. Will continue to f/u to assist with the DC plan.

## 2023-09-14 NOTE — Progress Notes (Signed)
 [name] [name]   ROUNDING   NOTE Radin Raptis FMW:[id number]  DOB: 1955-07-20  DOA: 09/12/2023  PCP: [name] HERO, PA-C  09/14/2023,3:35 PM  LOS: 0 days    Code Status: Full     from: Apparently home   68 year old white male Chronic A-fib with slow ventricular response CHADVASC 4 on apixaban , CABG X4 2003-aortic aneurysm repair 2003 CVA 11/21/2022 (noncompliance with Eliquis )--I have to repeat my discussion with DM2 by 2 HLD HFpEF EF 55-60%-baseline NYHA III Sacral decubitus ulcer at baseline He also does have chronic fecal incontinence and has colonoscopy scheduled with Dr. Leigh 10/14 Recent retinal surgery within the past 2 months and cannot drive Recent hospitalization 8/17 through 08/22/2023 with AECHF, bilateral pleural effusions was placed on GDMT Aldactone  losartan  and continued on home Lasix  He saw primary care 8/28 and was told to follow-up for repeat kidney tests on 9/4--his office weight was 147 pounds discharge wght 138 Cardiology input listed 9/12    Assessment  & Plan :    AKI 2/2 ATN on admission secondary to GDMT Plan continue only losartan  50 per cardiology, fluids NS now saline lock Creatinine trending down would not place on ARB Aldactone  or Lasix  at this time.  Can follow instead as an outpatient with TOC visit and titration of meds as needed Chronic A-fib slow RVR on apixaban  Baseline bradycardia Not on rate control Continue Eliquis  5 twice daily CABG X4, aortic aneurysmal repair 2003 Continue Lipitor 40-LFTs, lipid panel in 1 to 2 months Chronic HFpEF EF 55-60 NYHA III at baseline See above discussion Is about 900 cc plus today DMT by 2 Would not use Januvia  in the future-hesitate to use metformin given risk of azotemia Glipizide  2.5 a.m. resumed along with sliding scale coverage-all sugars typically below 180 right now Chronic fecal incontinence iron  deficiency anemia Stop Imodium , keep appointment with GI Dr. Leigh 10/15 for  colonoscopy Depression Continues Remeron  15 Paxil  20 and Seroquel  25 Chronic sacral decubitus Lower extremity wounds History of PAD and ulcers to foot dressings as per wound care nurse appreciate her input Areas on legs seem pressure injury no necrosis or gangrene based on pictures in chart as below Will reassess wounds in the next 24 to 48 hours Legal blindness Previous history of CVA in the setting of Eliquis  noncompliance Has cognitive issues and refuses care at times Therapy evals still pending-right now looks like home health Needs MOcA testing as well--Neuro follow-up Requires outpatient assessment regarding possible Parkinson's   Called to discussed with patient's son [name] [phone]    Data Reviewed:   Sodium 135 potassium 4.5 BUN/creatinine now 56/1.1 CO2 20 WBC 6.3 platelet 208 hemoglobin 8.2  DVT prophylaxis: Eliquis   Status is: Observation The patient will require care spanning > 2 midnights and should be moved to inpatient because:   Reading further follow-up from therapy     Current Dispo: Inpatient for now    Subjective:    More coherent-can tell me date time year Seems much more coherent than yesterday No pain no fever Eating drinking No nausea no vomiting   Objective + exam Vitals:   09/13/23 2327 09/14/23 0441 09/14/23 0730 09/14/23 1213  BP: (!) 152/80 (!) 156/80 (!) 202/83 (!) 164/90  Pulse: (!) 49 (!) 50 (!) 48 (!) 56  Resp:  18 18 17   Temp: 98.2 F (36.8 C) 98.3 F (36.8 C) 98.3 F (36.8 C) 98.3 F (36.8 C)  TempSrc: Oral Oral Oral Oral  SpO2: 100% 100% 99% 100%  Weight:  Height:       Filed Weights   09/12/23 0952  Weight: 63 kg     Examination:  Eomi ncat no focal deficit Cta b no wheeze Rom intact Power 5/5 Abd soft nt nd no rebound no guard Decubiti as well as lower extremity wounds not visualized today   Scheduled Meds:  apixaban   5 mg Oral BID   atorvastatin   40 mg Oral Daily   insulin  aspart  0-9  Units Subcutaneous TID WC   losartan   50 mg Oral Daily   mirtazapine   15 mg Oral QHS   PARoxetine   20 mg Oral Daily   QUEtiapine   25 mg Oral QHS   sodium chloride  flush  3 mL Intravenous Q12H   Continuous Infusions:  Time  40  Jai-Gurmukh Augusten Lipkin, MD  [facility]

## 2023-09-14 NOTE — Evaluation (Signed)
 Occupational Therapy Evaluation Patient Details Name: [name] [name] MRN: [id number] DOB: 1955/11/07 Today's Date: 09/14/2023   History of Present Illness   Pt is a 68 yr old male who presented 08/18/23 due to LE edema, wound HTN, and hallucinations. Pt admitted due to acute on chronic CHF. Pt's chest xray showed bilateral pleural effusion.  PMH: A-fib on Eliquis , CAD s/p CABG, PAD, diastolic CHF, CVA, DM2 on insulin  pump, HTN, HLD, depression, eye sx to R eye     Clinical Impressions Pt admitted for above, PTA pt was living alone although his son lives two doors down and checks on him in the AM/PM, pt ambulates with Rollator and is ind with Adls while family assists with iADLs. Pt currently ambulating with CGA and completes ADLs with CGA to setup A, he did not want to do much mobility this session but demonstrated impaired safety awareness and had mild LOB to which he self corrected at the sink. Pt with hx of falls, he would greatly benefit from a transition to memory care or a LTC facility for direct supervision for ADLs and mobility. OT to continue following pt acutely to progress pt as able, placed PT consult as pt has been refusing his [facility] services and would benefit from continued balance and safety tips.      If plan is discharge home, recommend the following:   Assistance with cooking/housework;Direct supervision/assist for medications management;Direct supervision/assist for financial management;Assist for transportation;Supervision due to cognitive status     Functional Status Assessment   Patient has had a recent decline in their functional status and demonstrates the ability to make significant improvements in function in a reasonable and predictable amount of time.     Equipment Recommendations   None recommended by OT     Recommendations for Other Services         Precautions/Restrictions   Precautions Precautions: Fall Recall of Precautions/Restrictions:  Intact Restrictions Weight Bearing Restrictions Per Provider Order: No     Mobility Bed Mobility Overal bed mobility: Needs Assistance Bed Mobility: Supine to Sit, Sit to Supine     Supine to sit: Contact guard Sit to supine: Contact guard assist   General bed mobility comments: increased time    Transfers Overall transfer level: Needs assistance Equipment used: Rolling walker (2 wheels) Transfers: Sit to/from Stand Sit to Stand: Contact guard assist           General transfer comment: cues needed for hand placement.      Balance Overall balance assessment: Needs assistance Sitting-balance support: Feet supported Sitting balance-Leahy Scale: Good     Standing balance support: Bilateral upper extremity supported Standing balance-Leahy Scale: Fair                             ADL either performed or assessed with clinical judgement   ADL Overall ADL's : Needs assistance/impaired Eating/Feeding: Set up;Bed level Eating/Feeding Details (indicate cue type and reason): Pt declined sitting in recliner to eat. Grooming: Standing;Wash/dry face;Contact guard assist Grooming Details (indicate cue type and reason): cues for safety to have pt step inside RW to get closer to sink. Upper Body Bathing: Set up;Sitting   Lower Body Bathing: Sitting/lateral leans;Supervison/ safety   Upper Body Dressing : Sitting;Set up   Lower Body Dressing: Sitting/lateral leans;Set up;Supervision/safety   Toilet Transfer: Contact guard assist;Rolling walker (2 wheels)   Toileting- Clothing Manipulation and Hygiene: Contact guard assist  Functional mobility during ADLs: Contact guard assist;Rolling walker (2 wheels)       Vision   Additional Comments: Pt is blind in L eye and blurry vision in R eye post sx     Perception         Praxis         Pertinent Vitals/Pain Pain Assessment Pain Assessment: No/denies pain     Extremity/Trunk Assessment Upper  Extremity Assessment Upper Extremity Assessment: Overall WFL for tasks assessed   Lower Extremity Assessment Lower Extremity Assessment: Defer to PT evaluation       Communication Communication Communication: No apparent difficulties   Cognition Arousal: Alert Behavior During Therapy: WFL for tasks assessed/performed               OT - Cognition Comments: Pt overall alert, has decreased safety awareness and mild sequencing deficits, would benefit from further cognitive testing.                 Following commands: Intact       Cueing  General Comments   Cueing Techniques: Verbal cues  Discussed with son, pt soiling himself more often and losing weight over the past few months. He has been refusing his [facility] therapy sessions   Exercises     Shoulder Instructions      Home Living   Living Arrangements: Alone Available Help at Discharge: Family;Available PRN/intermittently Type of Home: Apartment Home Access: Level entry     Home Layout: One level     Bathroom Shower/Tub: Chief Strategy Officer: Standard     Home Equipment: Grab bars - toilet;Grab bars - tub/shower;Rollator (4 wheels);Wheelchair - Sport and exercise psychologist Comments: son lives next door to pt and check on him am/pm and completes medication management.      Prior Functioning/Environment Prior Level of Function : Needs assist       Physical Assist : ADLs (physical)   ADLs (physical): IADLs;Bathing Mobility Comments: uses 4WW mostly but then reports occasionly does not use any DME ADLs Comments: Per pt reports does not bathe unless son is there. Ind with ADls, son does iADls.    OT Problem List: Impaired balance (sitting and/or standing);Decreased safety awareness;Decreased cognition   OT Treatment/Interventions: Self-care/ADL training;Therapeutic exercise;Therapeutic activities;Patient/family education;Balance training      OT Goals(Current goals can be found  in the care plan section)   Acute Rehab OT Goals Patient Stated Goal: return home OT Goal Formulation: With patient Time For Goal Achievement: 09/28/23 Potential to Achieve Goals: Good ADL Goals Pt Will Perform Grooming: standing;with modified independence Pt Will Perform Lower Body Dressing: with modified independence;sit to/from stand Pt Will Transfer to Toilet: with modified independence;ambulating Additional ADL Goal #1: Pt family will collaborate with OT to establish a night time bathroom routine to reduce frequency of soiled events. Additional ADL Goal #2: Pt will complete a cognitive assessment to determine current level of cognitive function prior to returning to living alone.   OT Frequency:  Min 2X/week    Co-evaluation              AM-PAC OT 6 Clicks Daily Activity     Outcome Measure Help from another person eating meals?: A Little Help from another person taking care of personal grooming?: A Little Help from another person toileting, which includes using toliet, bedpan, or urinal?: A Little Help from another person bathing (including washing, rinsing, drying)?: A Little Help from another person to put on and taking off  regular upper body clothing?: A Little Help from another person to put on and taking off regular lower body clothing?: A Little 6 Click Score: 18   End of Session Equipment Utilized During Treatment: Gait belt;Rolling walker (2 wheels) Nurse Communication: Mobility status  Activity Tolerance: Patient tolerated treatment well Patient left: with call bell/phone within reach;in bed;with bed alarm set;with family/visitor present  OT Visit Diagnosis: Unsteadiness on feet (R26.81);Other abnormalities of gait and mobility (R26.89);Muscle weakness (generalized) (M62.81);History of falling (Z91.81)                Time: 0939-1000 OT Time Calculation (min): 21 min Charges:  OT General Charges $OT Visit: 1 Visit OT Evaluation $OT Eval Moderate  Complexity: 1 Mod  09/14/2023  AB, OTR/L  Acute Rehabilitation Services  Office: [phone]   Curtistine JONETTA Das 09/14/2023, 10:57 AM

## 2023-09-15 DIAGNOSIS — G9340 Encephalopathy, unspecified: Secondary | ICD-10-CM | POA: Diagnosis not present

## 2023-09-15 LAB — COMPREHENSIVE METABOLIC PANEL WITH GFR
ALT: 21 U/L (ref 0–44)
AST: 25 U/L (ref 15–41)
Albumin: 2.2 g/dL — ABNORMAL LOW (ref 3.5–5.0)
Alkaline Phosphatase: 146 U/L — ABNORMAL HIGH (ref 38–126)
Anion gap: 9 (ref 5–15)
BUN: 39 mg/dL — ABNORMAL HIGH (ref 8–23)
CO2: 18 mmol/L — ABNORMAL LOW (ref 22–32)
Calcium: 8.5 mg/dL — ABNORMAL LOW (ref 8.9–10.3)
Chloride: 107 mmol/L (ref 98–111)
Creatinine, Ser: 0.84 mg/dL (ref 0.61–1.24)
GFR, Estimated: >60 mL/min (ref >60)
Glucose, Bld: 260 mg/dL — ABNORMAL HIGH (ref 70–99)
Potassium: 4.5 mmol/L (ref 3.5–5.1)
Sodium: 134 mmol/L — ABNORMAL LOW (ref 135–145)
Total Bilirubin: 0.4 mg/dL (ref 0.0–1.2)
Total Protein: 6.6 g/dL (ref 6.5–8.1)

## 2023-09-15 LAB — GLUCOSE, CAPILLARY
Glucose-Capillary: 240 mg/dL — ABNORMAL HIGH (ref 70–99)
Glucose-Capillary: 230 mg/dL — ABNORMAL HIGH (ref 70–99)
Glucose-Capillary: 224 mg/dL — ABNORMAL HIGH (ref 70–99)

## 2023-09-15 LAB — CBC WITH DIFFERENTIAL/PLATELET
Abs Immature Granulocytes: 0.03 K/uL (ref 0.00–0.07)
Basophils Absolute: 0 K/uL (ref 0.0–0.1)
Basophils Relative: 0 %
Eosinophils Absolute: 0.2 K/uL (ref 0.0–0.5)
Eosinophils Relative: 3 %
HCT: 30.4 % — ABNORMAL LOW (ref 39.0–52.0)
Hemoglobin: 9.5 g/dL — ABNORMAL LOW (ref 13.0–17.0)
Immature Granulocytes: 0 %
Lymphocytes Relative: 10 %
Lymphs Abs: 0.7 K/uL (ref 0.7–4.0)
MCH: 27.3 pg (ref 26.0–34.0)
MCHC: 31.3 g/dL (ref 30.0–36.0)
MCV: 87.4 fL (ref 80.0–100.0)
Monocytes Absolute: 0.6 K/uL (ref 0.1–1.0)
Monocytes Relative: 8 %
Neutro Abs: 5.8 K/uL (ref 1.7–7.7)
Neutrophils Relative %: 79 %
Platelets: 210 K/uL (ref 150–400)
RBC: 3.48 MIL/uL — ABNORMAL LOW (ref 4.22–5.81)
RDW: 15.5 % (ref 11.5–15.5)
WBC: 7.5 K/uL (ref 4.0–10.5)
nRBC: 0 % (ref 0.0–0.2)

## 2023-09-15 LAB — URINE CULTURE: Culture: >=100000 — AB

## 2023-09-15 MED ORDER — GLIPIZIDE 5 MG PO TABS
2.5000 mg | ORAL_TABLET | Freq: Every day | ORAL | Status: DC
  Administered 2023-09-16: 2.5 mg via ORAL
  Filled 2023-09-15 (×2): qty 0.5

## 2023-09-15 MED ORDER — HYDRALAZINE HCL 20 MG/ML IJ SOLN
20.0000 mg | Freq: Four times a day (QID) | INTRAMUSCULAR | Status: DC | PRN
  Administered 2023-09-15: 20 mg via INTRAVENOUS
  Filled 2023-09-15: qty 1

## 2023-09-15 NOTE — Progress Notes (Signed)
 Occupational Therapy Treatment Patient Details Name: [name] [name] MRN: [id number] DOB: 1955/01/10 Today's Date: 09/15/2023   History of present illness Pt is a 68 yr old male who presented to [facility]/12/23 for AMS. Admitted with acute encephalopathy, uremia, and ?underlying dementia. Pt admitted 8/17 due to acute on chronic CHF.  PMH: A-fib on Eliquis , CAD s/p CABG, PAD, diastolic CHF, CVA, DM2 on insulin  pump, HTN, HLD, depression, eye sx to R eye   OT comments  Pt seen for further cognitive assessment at request of son. Pt scored a 11 on the Short Blessed Test of cognition indicating impairment consistent with dementia. Pt needing max increased time for problem solving and mod A for stating months of year in reverse order, able to recall 3/5 on delayed memory recall, and needing use of compensatory techniques for month of the year. Pt flat in affect throughout. Called son after session with son reporting various concerns about pt apathy related to self care at home, difficulty being redirected to engage in hygenic tasks, requiring increasing levels of assist over the past 5 and predominantly the last year. Son with concerns for pt having depression. Recommend HHOT at dc, palliative consult, spiritual care consult, and psych (on son request) consult.        If plan is discharge home, recommend the following:  Assistance with cooking/housework;Direct supervision/assist for medications management;Direct supervision/assist for financial management;Assist for transportation;Supervision due to cognitive status (cues for BADL engagement)   Equipment Recommendations  None recommended by OT    Recommendations for Other Services Other (comment) (pallaitive, psych, spiritual care)    Precautions / Restrictions Precautions Precautions: Fall Recall of Precautions/Restrictions: Intact Restrictions Weight Bearing Restrictions Per Provider Order: No       Mobility Bed Mobility                     Transfers                         Balance                                           ADL either performed or assessed with clinical judgement   ADL                                         General ADL Comments: focus session on cognitive assessment    Extremity/Trunk Assessment              Vision       Perception     Praxis     Communication     Cognition Arousal: Alert Behavior During Therapy: WFL for tasks assessed/performed Cognition: Cognition impaired   Orientation impairments: Time Awareness: Intellectual awareness intact, Online awareness impaired Memory impairment (select all impairments): Short-term memory, Declarative long-term memory Attention impairment (select first level of impairment): Sustained attention, Selective attention Executive functioning impairment (select all impairments): Organization, Problem solving, Reasoning, Sequencing, Initiation OT - Cognition Comments: pt scored a 11 on the short blessed test indicating cognitive impairment consistent with dementia                          Cueing      Exercises  Shoulder Instructions       General Comments spoke with son over the phone who reports max difficulty encouraging for and caring for pt at home with him checking on pt 2x daily and providing medication management assist and meals. pt often reluctant to bathe, change clothes, pt with no initiation of meal prep and only eats if a granola bar is left out for him. discussed transition to LTC and son interested. son also reports potential depression of pt and this is consistent with apathy related to ADL. Recommended pallaitive consult, psych consult, and spiritual care consult    Pertinent Vitals/ Pain       Pain Assessment Pain Assessment: Faces Faces Pain Scale: No hurt  Home Living                                          Prior  Functioning/Environment              Frequency  Min 2X/week        Progress Toward Goals  OT Goals(current goals can now be found in the care plan section)  Progress towards OT goals: Progressing toward goals  Acute Rehab OT Goals Patient Stated Goal: go home OT Goal Formulation: With patient Potential to Achieve Goals: Good ADL Goals Pt Will Perform Grooming: standing;with modified independence Pt Will Perform Lower Body Dressing: with modified independence;sit to/from stand Pt Will Transfer to Toilet: with modified independence;ambulating Additional ADL Goal #1: Pt family will collaborate with OT to establish a night time bathroom routine to reduce frequency of soiled events. Additional ADL Goal #2: Pt will complete a cognitive assessment to determine current level of cognitive function prior to returning to living alone.  Plan      Co-evaluation                 AM-PAC OT 6 Clicks Daily Activity     Outcome Measure   Help from another person eating meals?: A Little Help from another person taking care of personal grooming?: A Little Help from another person toileting, which includes using toliet, bedpan, or urinal?: A Little Help from another person bathing (including washing, rinsing, drying)?: A Little Help from another person to put on and taking off regular upper body clothing?: A Little Help from another person to put on and taking off regular lower body clothing?: A Little 6 Click Score: 18    End of Session    OT Visit Diagnosis: Unsteadiness on feet (R26.81);Other abnormalities of gait and mobility (R26.89);Muscle weakness (generalized) (M62.81);History of falling (Z91.81)   Activity Tolerance Patient tolerated treatment well   Patient Left with call bell/phone within reach;in bed;with bed alarm set   Nurse Communication Mobility status (consult pallaitive, psych, spiritual care)        Time: 1520-1600 OT Time Calculation (min): 40  min  Charges: OT General Charges $OT Visit: 1 Visit OT Treatments $Self Care/Home Management : 23-37 mins $Cognitive Funtion inital: Initial 15 mins  [name] [name] [name], OTR/L [facility] Acute Rehabilitation Office: [phone]   [name] [name] 09/15/2023, 4:36 PM

## 2023-09-15 NOTE — Plan of Care (Signed)

## 2023-09-15 NOTE — Evaluation (Signed)
 Physical Therapy Evaluation Patient Details Name: [name] [name] MRN: [id number] DOB: [birth year] Today's Date: 09/15/2023  History of Present Illness  Pt is a 68 yr old male who presented to [facility] on 09/12/23 for AMS. Admitted with acute encephalopathy, uremia, and ?underlying dementia. Pt admitted 8/17 due to acute on chronic CHF.  PMH: A-fib on Eliquis , CAD s/p CABG, PAD, diastolic CHF, CVA, DM2 on insulin  pump, HTN, HLD, depression, eye sx to R eye   Clinical Impression  Pt presents with condition above and deficits mentioned below, see PT Problem List. PTA, he was mod I using a rollator for functional mobility, living alone in a level entry apartment. Son lives next door to pt and checks on him in the AM & PM and completes medication management. The son expresses that the pt has turned away [facility] services in the past and there has been increased caregiver burden recently with concern for cognitive decline over the past year. The son reports he works and has to travel for work at times, concerned about his father being home alone for extended periods of time without anyone checking on him. The pt is familiar to this PT from prior admissions, and the pt appears to be functioning near his baseline. He was disoriented to the date and had difficulty dual tasking cognitive tasks with motor tasks, favoring the motor task. However, the pt has demonstrated some similar cognitive deficits during prior admissions and appears near his cognitive baseline. Discussed that the son may need to consider some long-term solutions, like increased HH services if the pt will accept them or a memory care facility/ALF. Reached out to SW and CM to discuss options with pt's son also. Son Adult nurse of these recs and assistance. Currently, the pt displays deficits in balance, strength, and endurance and is at risk for falls, but currently only needing CGA-supervision for safety with all functional mobility while utilizing a  rollator for standing mobility. Will continue to follow acutely.        If plan is discharge home, recommend the following: A little help with bathing/dressing/bathroom;Assistance with cooking/housework;Direct supervision/assist for medications management;Direct supervision/assist for financial management;Assist for transportation;Supervision due to cognitive status   Can travel by private vehicle        Equipment Recommendations Other (comment) (leg rests for w/c (current w/c missing them at home); grab bars in shower)  Recommendations for Other Services       Functional Status Assessment Patient has had a recent decline in their functional status and demonstrates the ability to make significant improvements in function in a reasonable and predictable amount of time.     Precautions / Restrictions Precautions Precautions: Fall Restrictions Weight Bearing Restrictions Per Provider Order: No      Mobility  Bed Mobility Overal bed mobility: Needs Assistance Bed Mobility: Supine to Sit     Supine to sit: Supervision, HOB elevated, Used rails     General bed mobility comments: Extra time and pt using momentum of rocking trunk and throwing legs over R EOB to ascend trunk to sit up R EOB, supervision for safety    Transfers Overall transfer level: Needs assistance Equipment used: Rollator (4 wheels) Transfers: Sit to/from Stand Sit to Stand: Supervision           General transfer comment: Supervision for safety, no LOB    Ambulation/Gait Ambulation/Gait assistance: Contact guard assist Gait Distance (Feet): 160 Feet Assistive device: Rollator (4 wheels) Gait Pattern/deviations: Step-through pattern, Decreased step length - right, Decreased  step length - left, Decreased stride length, Trunk flexed Gait velocity: reduced Gait velocity interpretation: <1.8 ft/sec, indicate of risk for recurrent falls   General Gait Details: Pt takes slow, small steps with a flexed  posture, standing to rest x2 before finishing the gait bout. No LOB, even with head turns and nods and with dual cognitive tasks. CGA for safety  Stairs            Wheelchair Mobility     Tilt Bed    Modified Rankin (Stroke Patients Only)       Balance Overall balance assessment: Needs assistance Sitting-balance support: No upper extremity supported, Feet supported Sitting balance-Leahy Scale: Good     Standing balance support: Bilateral upper extremity supported, During functional activity, Reliant on assistive device for balance Standing balance-Leahy Scale: Poor Standing balance comment: reliant on rollator                             Pertinent Vitals/Pain Pain Assessment Pain Assessment: Faces Faces Pain Scale: No hurt Pain Intervention(s): Monitored during session    Home Living Family/patient expects to be discharged to:: Private residence Living Arrangements: Alone Available Help at Discharge: Family;Available PRN/intermittently Type of Home: Apartment Home Access: Level entry       Home Layout: One level Home Equipment: Grab bars - toilet;Grab bars - tub/shower;Rollator (4 wheels);Wheelchair - Lawyer Comments: son lives next door to pt and check on him am/pm and completes medication management.    Prior Function Prior Level of Function : Needs assist       Physical Assist : ADLs (physical)   ADLs (physical): IADLs;Bathing Mobility Comments: uses 4WW mostly but then reports occasionly does not use any DME ADLs Comments: Per pt reports does not bathe unless son is there. Ind with ADls, son does iADls.     Extremity/Trunk Assessment   Upper Extremity Assessment Upper Extremity Assessment: Defer to OT evaluation    Lower Extremity Assessment Lower Extremity Assessment: Generalized weakness;RLE deficits/detail;LLE deficits/detail RLE Deficits / Details: hx of peripheral neuropathy impacting sensation in  feet RLE Sensation: history of peripheral neuropathy LLE Deficits / Details: hx of peripheral neuropathy impacting sensation in feet LLE Sensation: history of peripheral neuropathy    Cervical / Trunk Assessment Cervical / Trunk Assessment: Kyphotic  Communication   Communication Communication: No apparent difficulties    Cognition Arousal: Alert Behavior During Therapy: WFL for tasks assessed/performed   PT - Cognitive impairments: Orientation   Orientation impairments: Time                   PT - Cognition Comments: Oriented to location, situation and self, but thought it was August 30th. Pt able to dual task counting backwards from 20 while ambulating with almonst making x1 mistake but quickly corrected himself. Pt had difficulty performing cog task of dual tasking naming animals that start with different letters of the alphabet while ambulating, often needing help or extra time to name A-E. Based on prior encounters with pt he seems close to his baseline. Following commands: Intact       Cueing Cueing Techniques: Verbal cues     General Comments General comments (skin integrity, edema, etc.): Spoke with pt's son over the phone, explaining pt appears at his functional baseline currently and recommending long-term solutions to reduce caregiver burden and improve pt safety, like increased HH services/aide or long-term memory care/ALF admission. Coordinated with CM/SW to contact son  to discuss options.    Exercises     Assessment/Plan    PT Assessment Patient needs continued PT services  PT Problem List Decreased strength;Decreased activity tolerance;Decreased balance;Decreased mobility;Decreased cognition       PT Treatment Interventions DME instruction;Gait training;Therapeutic exercise;Functional mobility training;Therapeutic activities;Patient/family education;Balance training;Neuromuscular re-education;Cognitive remediation    PT Goals (Current goals can be  found in the Care Plan section)  Acute Rehab PT Goals Patient Stated Goal: to go home PT Goal Formulation: With patient/family Time For Goal Achievement: 09/29/23 Potential to Achieve Goals: Good    Frequency Min 2X/week     Co-evaluation               AM-PAC PT 6 Clicks Mobility  Outcome Measure Help needed turning from your back to your side while in a flat bed without using bedrails?: A Little Help needed moving from lying on your back to sitting on the side of a flat bed without using bedrails?: A Little Help needed moving to and from a bed to a chair (including a wheelchair)?: A Little Help needed standing up from a chair using your arms (e.g., wheelchair or bedside chair)?: A Little Help needed to walk in hospital room?: A Little Help needed climbing 3-5 steps with a railing? : A Lot 6 Click Score: 17    End of Session Equipment Utilized During Treatment: Gait belt Activity Tolerance: Patient tolerated treatment well Patient left: in chair;with call bell/phone within reach;with chair alarm set Nurse Communication: Other (comment) (Updated MD of pt's current functional status) PT Visit Diagnosis: Unsteadiness on feet (R26.81);Other abnormalities of gait and mobility (R26.89);Muscle weakness (generalized) (M62.81);History of falling (Z91.81);Difficulty in walking, not elsewhere classified (R26.2)    Time: 9182-9146 PT Time Calculation (min) (ACUTE ONLY): 36 min   Charges:   PT Evaluation $PT Eval Moderate Complexity: 1 Mod PT Treatments $Therapeutic Activity: 8-22 mins PT General Charges $$ ACUTE PT VISIT: 1 Visit         Theo Ferretti, PT, DPT Acute Rehabilitation Services  Office: [phone]   Theo CHRISTELLA Ferretti 09/15/2023, 9:13 AM

## 2023-09-15 NOTE — Progress Notes (Signed)
 RNCM spoke to patient's son regarding HH.  Patient is currently active with [facility] services per son.  Patient's son is worried about Mr. Welshans's cognition and his ability to make decisions.  Discussed PCS and he will check to see if this is covered by his insurance.  He does not have a long term care policy.  LCSW will talk with son today as well.  Case Management and SW will continue to follow.

## 2023-09-15 NOTE — Consult Note (Addendum)
 WOC consulted to re-evaluate wound care instructions for home. Plan is for patient to DC home 09/16/2023 with home health.   Patient with DTPI sacrum with 2 open red moist areas; L lateral foot DTPI with open area beneath 5th digit; R lateral 5th digit DTPI with discoloration to nail bed but appears intact.    Xeroform gauze is appropriate for sacrum; foot wounds need no wound care, just a silicone foam and keep pressure off.  Change Xeroform every other day and silicone foam q3 days.     POC discussed with MD.  Wound care orders simplified for home.    WOC team will not follow. Re-consult if further needs arise.   Thank you,    Powell Bar MSN, RN-BC, [employer]

## 2023-09-15 NOTE — Progress Notes (Addendum)
 [facility]   ROUNDING   NOTE [name] [name] FMW:[id number]  DOB: January 02, 1955  DOA: 09/12/2023  PCP: [name] HERO, PA-C  09/15/2023,4:21 PM  LOS: 0 days    Code Status: Full     from: Apparently home   68 year old white male Chronic A-fib with slow ventricular response CHADVASC 4 on apixaban , CABG X4 2003-aortic aneurysm repair 2003 CVA 11/21/2022 (noncompliance with Eliquis )--I have to repeat my discussion with DM2 by 2 HLD HFpEF EF 55-60%-baseline NYHA III Sacral decubitus ulcer at baseline He also does have chronic fecal incontinence and has colonoscopy scheduled with Dr. Leigh 10/14 Recent retinal surgery within the past 2 months and cannot drive Recent hospitalization 8/17 through 08/22/2023 with AECHF, bilateral pleural effusions was placed on GDMT Aldactone  losartan  and continued on home Lasix  He saw primary care 8/28 and was told to follow-up for repeat kidney tests on 9/4--his office weight was 147 pounds discharge wght 138 Cardiology input elicited 9/12 Planning for home with home health once care can be arranged in the outpatient setting    Assessment  & Plan :    AKI 2/2 ATN on admission secondary to GDMT Plan continue only losartan  50 per cardiology, NSL IV No Lasix  Aldactone  for now needs [facility] visit-CC cardiology Chronic A-fib slow RVR on apixaban  Baseline bradycardia Not on rate control Continue Eliquis  5 twice daily CABG X4, aortic aneurysmal repair 2003 Continue Lipitor 40-LFTs, lipid panel in 1 to 2 months Chronic HFpEF EF 55-60 NYHA III at baseline See above discussion Is about +118 cc today DMT by 2 Would not use Januvia  in the future-hesitate to use metformin given risk of azotemia Glipizide  2.5 a.m. resumed, continue sliding scale, may need to add another agent in 24 hours if no better or below 200 Chronic fecal incontinence iron  deficiency anemia Stop Imodium , keep appointment with GI Dr. Leigh 10/15 for colonoscopy Depression Continues Remeron  15  Paxil  20 and Seroquel  25 Chronic sacral decubitus Lower extremity wounds History of PAD and ulcers to foot dressings as per wound care nurse appreciate her input-Will ask for updated input at discharge Areas on legs seem pressure injury no necrosis or gangrene based on pictures previously performed Legal blindness Previous history of CVA in the setting of Eliquis  noncompliance Has cognitive issues and refuses care at times Physical therapy and myself spoke to the patient's son 9/14 and care manager as well as TOC will work with him to get care in the outpatient setting   Called along with physical therapist to discuss with patient's son [name] [phone]    Data Reviewed:   Sodium 134 potassium 4.5 BUN/creatinine 39/0.8 Significantly improved) Alk phos 146 AST/ALT 25/21 WBC 7.5 hemoglobin 9.5 platelet 210  DVT prophylaxis: Eliquis   Status is: Observation The patient will require care spanning > 2 midnights and should be moved to inpatient because:   Reading further follow-up from therapy     Current Dispo: Inpatient for now    Subjective:    Much more coherent No distress Cannot tell me date time year-gets a little bit confused at times per nursing however  Objective + exam Vitals:   09/15/23 0600 09/15/23 0806 09/15/23 1205 09/15/23 1543  BP: (!) 147/71 (!) 160/82 (!) 151/77 (!) 156/92  Pulse:  64 (!) 56 (!) 57  Resp:  20 18 19   Temp:  97.8 F (36.6 C) 98 F (36.7 C) 97.8 F (36.6 C)  TempSrc:  Oral Oral Oral  SpO2:  98% 98% 99%  Weight:  Height:       Filed Weights   09/12/23 0952  Weight: 63 kg     Examination:  Eomi ncat no focal deficit-somewhat cachectic-more interactive Cta b no wheeze Rom intact Power 5/5 Abd soft nt nd no rebound no guard Decubiti as well as lower extremity wounds not visualized today   Scheduled Meds:  apixaban   5 mg Oral BID   atorvastatin   40 mg Oral Daily   insulin  aspart  0-9 Units Subcutaneous TID WC    losartan   50 mg Oral Daily   mirtazapine   15 mg Oral QHS   PARoxetine   20 mg Oral Daily   QUEtiapine   25 mg Oral QHS   sodium chloride  flush  3 mL Intravenous Q12H   Continuous Infusions:  Time  30  Jai-Gurmukh Phinehas Grounds, MD  [facility]

## 2023-09-15 NOTE — Plan of Care (Signed)
  Problem: Education: Goal: Ability to describe self-care measures that may prevent or decrease complications (Diabetes Survival Skills Education) will improve Outcome: Progressing Goal: Individualized Educational Video(s) Outcome: Progressing   Problem: Coping: Goal: Ability to adjust to condition or change in health will improve Outcome: Progressing   Problem: Fluid Volume: Goal: Ability to maintain a balanced intake and output will improve Outcome: Progressing   Problem: Health Behavior/Discharge Planning: Goal: Ability to identify and utilize available resources and services will improve Outcome: Progressing Goal: Ability to manage health-related needs will improve Outcome: Progressing   Problem: Metabolic: Goal: Ability to maintain appropriate glucose levels will improve Outcome: Progressing   Problem: Nutritional: Goal: Maintenance of adequate nutrition will improve Outcome: Progressing Goal: Progress toward achieving an optimal weight will improve Outcome: Progressing   Problem: Skin Integrity: Goal: Risk for impaired skin integrity will decrease Outcome: Progressing   Problem: Education: Goal: Knowledge of General Education information will improve Description: Including pain rating scale, medication(s)/side effects and non-pharmacologic comfort measures Outcome: Progressing   Problem: Health Behavior/Discharge Planning: Goal: Ability to manage health-related needs will improve Outcome: Progressing   Problem: Clinical Measurements: Goal: Ability to maintain clinical measurements within normal limits will improve Outcome: Progressing Goal: Will remain free from infection Outcome: Progressing Goal: Diagnostic test results will improve Outcome: Progressing Goal: Respiratory complications will improve Outcome: Progressing Goal: Cardiovascular complication will be avoided Outcome: Progressing   Problem: Activity: Goal: Risk for activity intolerance will  decrease Outcome: Progressing   Problem: Nutrition: Goal: Adequate nutrition will be maintained Outcome: Progressing   Problem: Coping: Goal: Level of anxiety will decrease Outcome: Progressing

## 2023-09-15 NOTE — Plan of Care (Signed)
  Problem: Education: Goal: Ability to describe self-care measures that may prevent or decrease complications (Diabetes Survival Skills Education) will improve Outcome: Progressing Goal: Individualized Educational Video(s) Outcome: Progressing   Problem: Coping: Goal: Ability to adjust to condition or change in health will improve Outcome: Progressing   Problem: Fluid Volume: Goal: Ability to maintain a balanced intake and output will improve Outcome: Progressing   Problem: Health Behavior/Discharge Planning: Goal: Ability to identify and utilize available resources and services will improve Outcome: Progressing Goal: Ability to manage health-related needs will improve Outcome: Progressing   Problem: Metabolic: Goal: Ability to maintain appropriate glucose levels will improve Outcome: Progressing   Problem: Nutritional: Goal: Maintenance of adequate nutrition will improve Outcome: Progressing Goal: Progress toward achieving an optimal weight will improve Outcome: Progressing   Problem: Skin Integrity: Goal: Risk for impaired skin integrity will decrease Outcome: Progressing   Problem: Tissue Perfusion: Goal: Adequacy of tissue perfusion will improve Outcome: Progressing   Problem: Health Behavior/Discharge Planning: Goal: Ability to manage health-related needs will improve Outcome: Progressing   Problem: Clinical Measurements: Goal: Ability to maintain clinical measurements within normal limits will improve Outcome: Progressing Goal: Will remain free from infection Outcome: Progressing Goal: Diagnostic test results will improve Outcome: Progressing Goal: Respiratory complications will improve Outcome: Progressing Goal: Cardiovascular complication will be avoided Outcome: Progressing   Problem: Activity: Goal: Risk for activity intolerance will decrease Outcome: Progressing   Problem: Nutrition: Goal: Adequate nutrition will be maintained Outcome:  Progressing   Problem: Coping: Goal: Level of anxiety will decrease Outcome: Progressing   Problem: Elimination: Goal: Will not experience complications related to bowel motility Outcome: Progressing Goal: Will not experience complications related to urinary retention Outcome: Progressing   Problem: Pain Managment: Goal: General experience of comfort will improve and/or be controlled Outcome: Progressing   Problem: Safety: Goal: Ability to remain free from injury will improve Outcome: Progressing   Problem: Skin Integrity: Goal: Risk for impaired skin integrity will decrease Outcome: Progressing

## 2023-09-16 ENCOUNTER — Encounter: Payer: Self-pay | Admitting: Cardiovascular Disease

## 2023-09-16 ENCOUNTER — Encounter: Payer: Self-pay | Admitting: Physician Assistant

## 2023-09-16 ENCOUNTER — Telehealth: Payer: Self-pay

## 2023-09-16 DIAGNOSIS — G9340 Encephalopathy, unspecified: Secondary | ICD-10-CM | POA: Diagnosis not present

## 2023-09-16 LAB — GLUCOSE, CAPILLARY
Glucose-Capillary: 184 mg/dL — ABNORMAL HIGH (ref 70–99)
Glucose-Capillary: 157 mg/dL — ABNORMAL HIGH (ref 70–99)
Glucose-Capillary: 82 mg/dL (ref 70–99)

## 2023-09-16 MED ORDER — LOSARTAN POTASSIUM 50 MG PO TABS: 50.0000 mg | ORAL_TABLET | Freq: Every day | ORAL | 3 refills | Status: DC

## 2023-09-16 NOTE — Plan of Care (Signed)
 Problem: Education: Goal: Ability to describe self-care measures that may prevent or decrease complications (Diabetes Survival Skills Education) will improve 09/16/2023 1127 by Jenel Bobetta SAILOR, RN Outcome: Adequate for Discharge 09/16/2023 0738 by Jenel Bobetta SAILOR, RN Outcome: Progressing Goal: Individualized Educational Video(s) 09/16/2023 1127 by Jenel Bobetta SAILOR, RN Outcome: Adequate for Discharge 09/16/2023 0738 by Jenel Bobetta SAILOR, RN Outcome: Progressing   Problem: Coping: Goal: Ability to adjust to condition or change in health will improve 09/16/2023 1127 by Jenel Bobetta SAILOR, RN Outcome: Adequate for Discharge 09/16/2023 0738 by Jenel Bobetta SAILOR, RN Outcome: Progressing   Problem: Fluid Volume: Goal: Ability to maintain a balanced intake and output will improve 09/16/2023 1127 by Jenel Bobetta SAILOR, RN Outcome: Adequate for Discharge 09/16/2023 0738 by Jenel Bobetta SAILOR, RN Outcome: Progressing   Problem: Health Behavior/Discharge Planning: Goal: Ability to identify and utilize available resources and services will improve 09/16/2023 1127 by Jenel Bobetta SAILOR, RN Outcome: Adequate for Discharge 09/16/2023 0738 by Jenel Bobetta SAILOR, RN Outcome: Progressing Goal: Ability to manage health-related needs will improve 09/16/2023 1127 by Jenel Bobetta SAILOR, RN Outcome: Adequate for Discharge 09/16/2023 0738 by Jenel Bobetta SAILOR, RN Outcome: Progressing   Problem: Metabolic: Goal: Ability to maintain appropriate glucose levels will improve 09/16/2023 1127 by Jenel Bobetta SAILOR, RN Outcome: Adequate for Discharge 09/16/2023 0738 by Jenel Bobetta SAILOR, RN Outcome: Progressing   Problem: Nutritional: Goal: Maintenance of adequate nutrition will improve 09/16/2023 1127 by Jenel Bobetta SAILOR, RN Outcome: Adequate for Discharge 09/16/2023 0738 by Jenel Bobetta SAILOR, RN Outcome: Progressing Goal: Progress toward achieving an optimal weight will improve 09/16/2023 1127 by Jenel Bobetta SAILOR, RN Outcome: Adequate for  Discharge 09/16/2023 0738 by Jenel Bobetta SAILOR, RN Outcome: Progressing   Problem: Skin Integrity: Goal: Risk for impaired skin integrity will decrease 09/16/2023 1127 by Jenel Bobetta SAILOR, RN Outcome: Adequate for Discharge 09/16/2023 0738 by Jenel Bobetta SAILOR, RN Outcome: Progressing   Problem: Tissue Perfusion: Goal: Adequacy of tissue perfusion will improve 09/16/2023 1127 by Jenel Bobetta SAILOR, RN Outcome: Adequate for Discharge 09/16/2023 0738 by Jenel Bobetta SAILOR, RN Outcome: Progressing   Problem: Education: Goal: Knowledge of General Education information will improve Description: Including pain rating scale, medication(s)/side effects and non-pharmacologic comfort measures 09/16/2023 1127 by Jenel Bobetta SAILOR, RN Outcome: Adequate for Discharge 09/16/2023 0738 by Jenel Bobetta SAILOR, RN Outcome: Progressing   Problem: Health Behavior/Discharge Planning: Goal: Ability to manage health-related needs will improve 09/16/2023 1127 by Jenel Bobetta SAILOR, RN Outcome: Adequate for Discharge 09/16/2023 0738 by Jenel Bobetta SAILOR, RN Outcome: Progressing   Problem: Clinical Measurements: Goal: Ability to maintain clinical measurements within normal limits will improve 09/16/2023 1127 by Jenel Bobetta SAILOR, RN Outcome: Adequate for Discharge 09/16/2023 0738 by Jenel Bobetta SAILOR, RN Outcome: Progressing Goal: Will remain free from infection 09/16/2023 1127 by Jenel Bobetta SAILOR, RN Outcome: Adequate for Discharge 09/16/2023 0738 by Jenel Bobetta SAILOR, RN Outcome: Progressing Goal: Diagnostic test results will improve 09/16/2023 1127 by Jenel Bobetta SAILOR, RN Outcome: Adequate for Discharge 09/16/2023 0738 by Jenel Bobetta SAILOR, RN Outcome: Progressing Goal: Respiratory complications will improve 09/16/2023 1127 by Jenel Bobetta SAILOR, RN Outcome: Adequate for Discharge 09/16/2023 0738 by Jenel Bobetta SAILOR, RN Outcome: Progressing Goal: Cardiovascular complication will be avoided 09/16/2023 1127 by Jenel Bobetta SAILOR, RN Outcome:  Adequate for Discharge 09/16/2023 0738 by Jenel Bobetta SAILOR, RN Outcome: Progressing   Problem: Activity: Goal: Risk for activity intolerance will decrease 09/16/2023 1127 by Jenel Bobetta SAILOR, RN Outcome: Adequate for Discharge  09/16/2023 0738 by Jenel Bobetta SAILOR, RN Outcome: Progressing   Problem: Nutrition: Goal: Adequate nutrition will be maintained 09/16/2023 1127 by Jenel Bobetta SAILOR, RN Outcome: Adequate for Discharge 09/16/2023 0738 by Jenel Bobetta SAILOR, RN Outcome: Progressing   Problem: Coping: Goal: Level of anxiety will decrease 09/16/2023 1127 by Jenel Bobetta SAILOR, RN Outcome: Adequate for Discharge 09/16/2023 0738 by Jenel Bobetta SAILOR, RN Outcome: Progressing   Problem: Elimination: Goal: Will not experience complications related to bowel motility 09/16/2023 1127 by Jenel Bobetta SAILOR, RN Outcome: Adequate for Discharge 09/16/2023 0738 by Jenel Bobetta SAILOR, RN Outcome: Progressing Goal: Will not experience complications related to urinary retention 09/16/2023 1127 by Jenel Bobetta SAILOR, RN Outcome: Adequate for Discharge 09/16/2023 0738 by Jenel Bobetta SAILOR, RN Outcome: Progressing   Problem: Pain Managment: Goal: General experience of comfort will improve and/or be controlled 09/16/2023 1127 by Jenel Bobetta SAILOR, RN Outcome: Adequate for Discharge 09/16/2023 0738 by Jenel Bobetta SAILOR, RN Outcome: Progressing   Problem: Safety: Goal: Ability to remain free from injury will improve 09/16/2023 1127 by Jenel Bobetta SAILOR, RN Outcome: Adequate for Discharge 09/16/2023 0738 by Jenel Bobetta SAILOR, RN Outcome: Progressing   Problem: Skin Integrity: Goal: Risk for impaired skin integrity will decrease 09/16/2023 1127 by Jenel Bobetta SAILOR, RN Outcome: Adequate for Discharge 09/16/2023 0738 by Jenel Bobetta SAILOR, RN Outcome: Progressing

## 2023-09-16 NOTE — TOC Transition Note (Signed)
 Transition of Care [facility]) - Discharge Note   Patient Details  Name: [name] [name] MRN: [id number] Date of Birth: 01/05/1955  Transition of Care [facility]) CM/SW Contact:  Almarie CHRISTELLA Goodie, LCSW Phone Number: 09/16/2023, 11:24 AM   Clinical Narrative:   CSW spoke with patient's son, [name], to discuss concerns with disposition. Lengthy conversation about patient's decline and services that have been put in place to assist. [name] in agreement that the patient needs to be placed in a facility, but the patient has been refusing and he has not declined enough that [name] could obtain guardianship. Patient has a neurology appointment tomorrow, CSW encouraged [name] to see how the patient does and if now may be an appropriate time to file for guardianship and start the process. [name] has resources for an elder lawyer to assist with the process, if needed. CSW also discussed caregivers with the patient's Medicare, if he has that benefit in his healthteam policy. CSW informed [name] that he would have to call to find out if the patient has that benefit. [name] also in agreement to arrange some private duty caregivers to help him, as well, but the funds are limited. CSW answered questions and [name] was appreciative of information.  CSW contacted [facility] that patient is discharging today. [facility] can also assist with the caregivers if patient is eligible with his insurance, they will update the social worker to reach out to the son. Son to provide transport home. No further inpatient care management needs at this time.    Final next level of care: Home w Home Health Services Barriers to Discharge: Barriers Resolved   Patient Goals and CMS Choice            Discharge Placement                Patient to be transferred to facility by: Family Name of family member notified: [name] Patient and family notified of of transfer: 09/16/23  Discharge Plan and Services Additional resources added to the After  Visit Summary for                            [facility] Arranged: RN, PT, OT, Nurse's Aide, Social Work [facility] Agency: [facility] Date HH Agency Contacted: 09/16/23   Representative spoke with at [facility] Agency: Arna  Social Drivers of Health (SDOH) Interventions SDOH Screenings   Food Insecurity: No Food Insecurity (09/12/2023)  Recent Concern: Food Insecurity - Food Insecurity Present (08/18/2023)  Housing: Low Risk  (09/12/2023)  Transportation Needs: No Transportation Needs (09/12/2023)  Utilities: Not At Risk (09/12/2023)  Alcohol Screen: Low Risk  (08/19/2023)  Depression (PHQ2-9): Medium Risk (07/26/2023)  Financial Resource Strain: Low Risk  (08/19/2023)  Physical Activity: Inactive (11/21/2022)  Social Connections: Socially Isolated (09/12/2023)  Stress: Stress Concern Present (11/21/2022)  Tobacco Use: High Risk (08/29/2023)  Health Literacy: Adequate Health Literacy (11/21/2022)     Readmission Risk Interventions    08/20/2023    4:20 PM  Readmission Risk Prevention Plan  Transportation Screening Complete  PCP or Specialist Appt within 3-5 Days Complete  HRI or Home Care Consult Complete  Palliative Care Screening Not Applicable  Medication Review (RN Care Manager) Complete

## 2023-09-16 NOTE — Plan of Care (Signed)

## 2023-09-16 NOTE — Telephone Encounter (Signed)
 Copied from CRM [id number]. Topic: Clinical - Medication Question >> Sep 13, 2023  1:36 PM Mercedes MATSU wrote: Reason for CRM: Nurse went out to see patient on 09/06/2023 and he had a heart rate of 40 bpm, and it was not reported. Nurse manager Clayborne wanted to report it today.     Callback: [phone]  Please see nurse visit msg left on Friday and advise; pt is currently inpatient.

## 2023-09-16 NOTE — TOC Transition Note (Signed)
 Transition of Care (TOC) - Discharge Note Rayfield Gobble RN, BSN Inpatient Care Management Unit 4E- RN Case Manager See Treatment Team for direct phone # [phone] cross coverage  Patient Details  Name: [name] [name] MRN: [id number] Date of Birth: 1955/01/30  Transition of Care [facility]) CM/SW Contact:  Gobble Rayfield Hurst, RN Phone Number: 09/16/2023, 3:57 PM   Clinical Narrative:    Pt stable for transition home today,  HH RN/PT/OT/aide/SW has been resumed with [facility]- liaison is aware and to follow.   Per CSW spoke with son regarding discharge. Son to follow up with [employer] insurance plan regarding benefits for PCS on discharge. They will need to f/u with PCP regarding auth for these services if pt has benefits.      Final next level of care: Home w Home Health Services Barriers to Discharge: Barriers Resolved   Patient Goals and CMS Choice Patient states their goals for this hospitalization and ongoing recovery are:: return home with [facility] CMS Medicare.gov Compare Post Acute Care list provided to:: Patient Represenative (must comment) Choice offered to / list presented to : Adult Children      Discharge Placement   Home w/ HH              Patient to be transferred to facility by: Family Name of family member notified: [name] Patient and family notified of of transfer: 09/16/23  Discharge Plan and Services Additional resources added to the After Visit Summary for     Discharge Planning Services: CM Consult Post Acute Care Choice: Home Health, Resumption of Svcs/PTA Provider          DME Arranged: N/A DME Agency: NA       HH Arranged: RN, PT, OT, Nurse's Aide, Social Work [employer] Agency: [facility] Date HH Agency Contacted: 09/16/23   Representative spoke with at [facility] Agency: Arna  Social Drivers of Health (SDOH) Interventions SDOH Screenings   Food Insecurity: No Food Insecurity (09/12/2023)  Recent Concern: Food Insecurity - Food Insecurity Present  (08/18/2023)  Housing: Low Risk  (09/12/2023)  Transportation Needs: No Transportation Needs (09/12/2023)  Utilities: Not At Risk (09/12/2023)  Alcohol Screen: Low Risk  (08/19/2023)  Depression (PHQ2-9): Medium Risk (07/26/2023)  Financial Resource Strain: Low Risk  (08/19/2023)  Physical Activity: Inactive (11/21/2022)  Social Connections: Socially Isolated (09/12/2023)  Stress: Stress Concern Present (11/21/2022)  Tobacco Use: High Risk (08/29/2023)  Health Literacy: Adequate Health Literacy (11/21/2022)     Readmission Risk Interventions    08/20/2023    4:20 PM  Readmission Risk Prevention Plan  Transportation Screening Complete  PCP or Specialist Appt within 3-5 Days Complete  HRI or Home Care Consult Complete  Palliative Care Screening Not Applicable  Medication Review (RN Care Manager) Complete

## 2023-09-16 NOTE — Care Management Obs Status (Signed)
 MEDICARE OBSERVATION STATUS NOTIFICATION   Patient Details  Name: Romin Divita MRN: [id number] Date of Birth: 01-23-1955   Medicare Observation Status Notification Given:  Yes  MEDICARE OBSERVATION STATUS NOTIFICATION         Claretta Deed reviewed observation notice with Ethel Saba telephonically at [phone] will send copy to home address    Claretta Deed 09/16/2023, 10:39 AM

## 2023-09-16 NOTE — Discharge Summary (Signed)
 Physician Discharge Summary  [name] [name] FMW:[id number] DOB: [birth year] DOA: 09/12/2023  PCP: [name] HERO, PA-C  Admit date: 09/12/2023 Discharge date: 09/16/2023  Time spent: 40 minutes  Recommendations for Outpatient Follow-up:  Chem-12 CBC 1 week TOC visit with ambulatory referral placed Keep appointment with Dr. Leigh gastroenterology 10/15 for GI workup  Discharge Diagnoses:  MAIN problem for hospitalization   AKI toxic metabolic encephalopathy on admission Sacral decubiti on admission with lower extremity wounds Legal blindness Previous CVA  Please see below for itemized issues addressed in HOpsital- refer to other progress notes for clarity if needed  Discharge Condition: Overall guarded  Diet recommendation: Diabetic heart healthy  Filed Weights   09/12/23 0952  Weight: 63 kg    History of present illness:  68 year old white male Chronic A-fib with slow ventricular response CHADVASC 4 on apixaban , CABG X4 2003-aortic aneurysm repair 2003 CVA 11/21/2022 (noncompliance with Eliquis )--I have to repeat my discussion with DM2 by 2 HLD HFpEF EF 55-60%-baseline NYHA III Sacral decubitus ulcer at baseline He also does have chronic fecal incontinence and has colonoscopy scheduled with Dr. Leigh 10/14 Recent retinal surgery within the past 2 months and cannot drive Recent hospitalization 8/17 through 08/22/2023 with AECHF, bilateral pleural effusions was placed on GDMT Aldactone  losartan  and continued on home Lasix  He saw primary care 8/28 and was told to follow-up for repeat kidney tests on 9/4--his office weight was 147 pounds discharge wght 138 Cardiology input elicited 9/12 Planning for home with home health once care can be arranged in the outpatient setting      Assessment  & Plan :      AKI 2/2 ATN on admission secondary to GDMT Plan continue only losartan  50 per cardiology, NSL IV No Lasix  Aldactone  for now needs TOC visit-CC  cardiology-ambulatory referral placed Chronic A-fib slow RVR on apixaban  Baseline bradycardia Not on rate control secondary to baseline bradycardia Continue Eliquis  5 twice daily CABG X4, aortic aneurysmal repair 2003 Continue Lipitor 40-LFTs, lipid panel in 1 to 2 months Chronic HFpEF EF 55-60 NYHA III at baseline See above discussion Recent adjustments made in the outpatient setting per cardiology and PCP Needs close adjustment of meds in the outpatient DMT by 2 Would not use Januvia  in the future-hesitate to use metformin given risk of azotemia Glipizide  2.5 a.m. resumed and titrated at discharge to 5 twice daily which was home dose-Will need to be checked as an outpatient with A1c/lipid panel 1 to 2 months Chronic fecal incontinence iron  deficiency anemia Stop Imodium , keep appointment with GI Dr. Leigh 10/15 for colonoscopy Depression Continues Remeron  15 Paxil  20 and Seroquel  25 Chronic sacral decubitus Lower extremity wounds History of PAD and ulcers to foot dressings as per wound care nurse appreciate her input-up to recommendations placed as below Areas on legs seem pressure injury no necrosis or gangrene based on pictures previously performed Legal blindness Previous history of CVA in the setting of Eliquis  noncompliance Has cognitive issues and refuses care at times Would be good for palliative care to see him in the outpatient setting Will need personal care services as well as therapy services to see him although he has historically refused the same at home Long discussions with son over the past several days-cannot place him to rehab as he is at his functional baseline Will need MoCA testing and MMSE on follow-up      Discharge Exam: Vitals:   09/16/23 0508 09/16/23 0747  BP: 135/85 (!) 175/90  Pulse: (!) 50 ROLLEN)  53  Resp: 18 16  Temp: 98.6 F (37 C)   SpO2: 99% 100%    Subj on day of d/c   Awake coherent in nad no focal deficit Cta b no added  sound S1 S2 slight brady Cta b no added sond Moving limbs x 4  No le edema  Discharge Instructions   Discharge Instructions     Ambulatory referral to Cardiology   Complete by: As directed    Needs TOC visit as was hospitalized for acute kidney injury in the setting of diuretics   Diet - low sodium heart healthy   Complete by: As directed    Discharge instructions   Complete by: As directed    Look at medication list carefully as several meds have changed including some of your diuretics and I will CC the cardiologist to ensure you are seen in the outpatient setting for close follow-up of reinitiation You will require labs in a week Please take all your other meds per the [facility] We will try to arrange home care services/personal care services for you and social work will talk to you about this and your son Best of luck   Discharge wound care:   Complete by: As directed    Daily      Comments: 1.  Buttock: Sacrum, cleanse with normal saline, pat dry with gauze and apply  Xeroform yellow guaze to wound bed every other day and cover with silicone foam.  Lift foam to change Xeroform  and change out foam dressing every 3 days. 2.  Left and R lateral foot wounds  cleanse with soap and water, dry and apply silicone foam.  Change silicone foam q3 days and keep pressure off areas as much as possible.   Increase activity slowly   Complete by: As directed       Allergies as of 09/16/2023       Reactions   Other Hives, Itching   Wool fabric - hives, itching        Medication List     STOP taking these medications    furosemide  40 MG tablet Commonly known as: LASIX    loperamide  2 MG capsule Commonly known as: IMODIUM    loratadine  10 MG tablet Commonly known as: CLARITIN    OVER THE COUNTER MEDICATION   PARoxetine  20 MG tablet Commonly known as: PAXIL    sitaGLIPtin  100 MG tablet Commonly known as: Januvia    spironolactone  25 MG tablet Commonly known as: ALDACTONE         TAKE these medications    acetaminophen  500 MG tablet Commonly known as: TYLENOL  Take 500 mg by mouth as needed for mild pain (pain score 1-3) or moderate pain (pain score 4-6).   atorvastatin  40 MG tablet Commonly known as: LIPITOR Take 1 tablet (40 mg total) by mouth daily.   Eliquis  5 MG Tabs tablet Generic drug: apixaban  TAKE 1 TABLET BY MOUTH TWICE A DAY   glipiZIDE  5 MG tablet Commonly known as: GLUCOTROL  Take 1 tablet (5 mg total) by mouth 2 (two) times daily before a meal. What changed:  how much to take additional instructions   losartan  50 MG tablet Commonly known as: COZAAR  Take 1 tablet (50 mg total) by mouth daily. What changed:  medication strength how much to take   mirtazapine  15 MG tablet Commonly known as: REMERON  TAKE 1 TABLET BY MOUTH EVERYDAY AT BEDTIME   QUEtiapine  25 MG tablet Commonly known as: SEROQUEL  GIVE 1 TABLET BY MOUTH AT BEDTIME FOR DELIRIUM  Vitamin D3 50 MCG (2000 UT) capsule Take 2,000 Units by mouth daily.               Discharge Care Instructions  (From admission, onward)           Start     Ordered   09/16/23 0000  Discharge wound care:       Comments: Daily      Comments: 1.  Buttock: Sacrum, cleanse with normal saline, pat dry with gauze and apply  Xeroform yellow guaze to wound bed every other day and cover with silicone foam.  Lift foam to change Xeroform  and change out foam dressing every 3 days. 2.  Left and R lateral foot wounds  cleanse with soap and water, dry and apply silicone foam.  Change silicone foam q3 days and keep pressure off areas as much as possible.   09/16/23 0913           Allergies  Allergen Reactions   Other Hives and Itching    Wool fabric - hives, itching      The results of significant diagnostics from this hospitalization (including imaging, microbiology, ancillary and laboratory) are listed below for reference.    Significant Diagnostic Studies: CT Head Wo  Contrast Result Date: 09/12/2023 CLINICAL DATA:  Mental status change, unknown cause EXAM: CT HEAD WITHOUT CONTRAST TECHNIQUE: Contiguous axial images were obtained from the base of the skull through the vertex without intravenous contrast. RADIATION DOSE REDUCTION: This exam was performed according to the departmental dose-optimization program which includes automated exposure control, adjustment of the mA and/or kV according to patient size and/or use of iterative reconstruction technique. COMPARISON:  02/02/2023 FINDINGS: Brain: The ventricles appear age appropriate. No mass effect or midline shift. Gray-white differentiation is preserved without focal attenuation abnormality.No evidence of acute territorial infarction, extra-axial fluid collection, hemorrhage, or mass lesion. The basilar cisterns are patent without downward herniation. The cerebellar hemispheres and vermis are well formed without mass lesion or focal attenuation abnormality. Vascular: No hyperdense vessel. Calcified atherosclerotic plaque within the cavernous/supraclinoid ICA and intradural vertebral arteries. Skull: Normal. Negative for fracture or focal lesion. Sinuses/Orbits: The paranasal sinuses and mastoids are clear.The globes appear intact. No retrobulbar hematoma.Bilateral lens replacements. Other: None. IMPRESSION: No acute intracranial abnormality, specifically, no acute hemorrhage, territorial infarction, or intracranial mass. Electronically Signed   By: Rogelia Myers M.D.   On: 09/12/2023 10:54   DG Chest Portable 1 View Result Date: 09/12/2023 CLINICAL DATA:  68 year old male with bilateral pleural effusions. EXAM: PORTABLE CHEST 1 VIEW COMPARISON:  Portable chest 08/21/2023 and earlier. FINDINGS: Portable AP semi upright view at 1010 hours. Chronic sternotomy and CABG. Stable cardiac size and mediastinal contours. Visualized tracheal air column is within normal limits. Partially loculated bilateral pleural effusions  demonstrated on noncontrast CT last month. Confluent and veiling right greater than left lung base opacity has not significantly changed from 08/21/2023. No areas of worsening ventilation. No pneumothorax or edema. Stable visualized osseous structures. IMPRESSION: Partially loculated bilateral pleural effusions not significantly changed from 08/21/2023. No new cardiopulmonary abnormality. Electronically Signed   By: VEAR Hurst M.D.   On: 09/12/2023 10:22   DG Chest 1 View Result Date: 08/21/2023 CLINICAL DATA:  Pleural effusion. EXAM: CHEST  1 VIEW COMPARISON:  August 18, 2023. FINDINGS: Stable cardiomediastinal silhouette. Status post coronary bypass graft. Stable bilateral pleural effusions are noted with associated bibasilar atelectasis or infiltrates, right greater than left. Bony thorax is unremarkable. IMPRESSION: Stable bilateral pleural effusions with  associated bibasilar atelectasis or infiltrates, right greater than left. Electronically Signed   By: Lynwood Landy Raddle M.D.   On: 08/21/2023 15:51   ECHOCARDIOGRAM COMPLETE Result Date: 08/19/2023    ECHOCARDIOGRAM REPORT   Patient Name:   JAKAYDEN CANCIO Date of Exam: 08/19/2023 Medical Rec #:  [id number]            Height:       74.0 in Accession #:    [id number]           Weight:       160.5 lb Date of Birth:  [birth year]             BSA:          1.979 m Patient Age:    68 years             BP:           158/71 mmHg Patient Gender: M                    HR:           52 bpm. Exam Location:  Inpatient Procedure: 2D Echo, Cardiac Doppler and Color Doppler (Both Spectral and Color            Flow Doppler were utilized during procedure). Indications:    Congestive Heart Failure I50.9  History:        Patient has prior history of Echocardiogram examinations, most                 recent 02/02/2023. CHF, CAD, Prior CABG, Arrythmias:Atrial                 Fibrillation; Risk Factors:Current Smoker, Hypertension,                 Diabetes and Dyslipidemia.   Sonographer:    Koleen Popper RDCS Referring Phys: [id number] SUBRINA SUNDIL IMPRESSIONS  1. Left ventricular ejection fraction, by estimation, is 55 to 60%. The left ventricle has normal function. The left ventricle has no regional wall motion abnormalities. There is mild asymmetric left ventricular hypertrophy of the basal-septal segment. Left ventricular diastolic parameters are consistent with Grade II diastolic dysfunction (pseudonormalization).  2. Right ventricular systolic function is normal. The right ventricular size is normal. Tricuspid regurgitation signal is inadequate for assessing PA pressure.  3. The mitral valve is normal in structure. Trivial mitral valve regurgitation. No evidence of mitral stenosis.  4. The aortic valve is tricuspid. There is moderate calcification of the aortic valve. Aortic valve regurgitation is not visualized. Aortic valve sclerosis/calcification is present, without any evidence of aortic stenosis.  5. The inferior vena cava is dilated in size with <50% respiratory variability, suggesting right atrial pressure of 15 mmHg.  6. Left pleural effusion noted. FINDINGS  Left Ventricle: Left ventricular ejection fraction, by estimation, is 55 to 60%. The left ventricle has normal function. The left ventricle has no regional wall motion abnormalities. The left ventricular internal cavity size was normal in size. There is  mild asymmetric left ventricular hypertrophy of the basal-septal segment. Left ventricular diastolic parameters are consistent with Grade II diastolic dysfunction (pseudonormalization). Right Ventricle: The right ventricular size is normal. No increase in right ventricular wall thickness. Right ventricular systolic function is normal. Tricuspid regurgitation signal is inadequate for assessing PA pressure. Left Atrium: Left atrial size was normal in size. Right Atrium: Right atrial size was normal in size. Pericardium: Left pleural effusion noted. There is no  evidence  of pericardial effusion. Mitral Valve: The mitral valve is normal in structure. There is mild calcification of the mitral valve leaflet(s). Trivial mitral valve regurgitation. No evidence of mitral valve stenosis. Tricuspid Valve: The tricuspid valve is normal in structure. Tricuspid valve regurgitation is not demonstrated. Aortic Valve: The aortic valve is tricuspid. There is moderate calcification of the aortic valve. Aortic valve regurgitation is not visualized. Aortic valve sclerosis/calcification is present, without any evidence of aortic stenosis. Pulmonic Valve: The pulmonic valve was normal in structure. Pulmonic valve regurgitation is trivial. Aorta: The aortic root is normal in size and structure. Venous: The inferior vena cava is dilated in size with less than 50% respiratory variability, suggesting right atrial pressure of 15 mmHg. IAS/Shunts: No atrial level shunt detected by color flow Doppler.  LEFT VENTRICLE PLAX 2D LVIDd:         5.10 cm LVIDs:         3.80 cm LV PW:         1.20 cm LV IVS:        1.30 cm LVOT diam:     2.40 cm LV SV:         104 LV SV Index:   52 LVOT Area:     4.52 cm  RIGHT VENTRICLE         IVC TAPSE (M-mode): 1.2 cm  IVC diam: 2.30 cm LEFT ATRIUM             Index LA diam:        3.90 cm 1.97 cm/m LA Vol (A2C):   38.9 ml 19.65 ml/m LA Vol (A4C):   43.2 ml 21.82 ml/m LA Biplane Vol: 42.2 ml 21.32 ml/m  AORTIC VALVE LVOT Vmax:   95.40 cm/s LVOT Vmean:  63.900 cm/s LVOT VTI:    0.229 m  AORTA Ao Root diam: 3.50 cm Ao Asc diam:  3.70 cm MITRAL VALVE MV Area (PHT): 2.69 cm     SHUNTS MV Decel Time: 282 msec     Systemic VTI:  0.23 m MV E velocity: 107.00 cm/s  Systemic Diam: 2.40 cm MV A velocity: 21.50 cm/s MV E/A ratio:  4.98 Dalton McleanMD Electronically signed by Ezra Kanner Signature Date/Time: 08/19/2023/7:06:44 PM    Final    CT CHEST WO CONTRAST Result Date: 08/18/2023 EXAM: CT CHEST WITHOUT CONTRAST 08/18/2023 09:18:51 PM TECHNIQUE: CT of the chest was  performed without the administration of intravenous contrast. Multiplanar reformatted images are provided for review. Automated exposure control, iterative reconstruction, and/or weight based adjustment of the mA/kV was utilized to reduce the radiation dose to as low as reasonably achievable. COMPARISON: None available. CLINICAL HISTORY: Bilateral pleural effusion and pneumonia. FINDINGS: MEDIASTINUM AND LYMPH NODES: Postsurgical changes in the mediastinum. Calcific aortic atherosclerosis. Coronary artery calcifications. LUNGS AND PLEURA: Right greater than left intermediate size pleural effusions. Bibasilar atelectasis. SOFT TISSUES/BONES: No acute abnormality of the bones or soft tissues. UPPER ABDOMEN: Limited images of the upper abdomen demonstrates no acute abnormality. IMPRESSION: 1. Right greater than left intermediate size pleural effusions with associated bibasilar atelectasis. 2. No acute findings of pneumonia. Electronically signed by: Franky Stanford MD 08/18/2023 11:28 PM EDT RP Workstation: [id number]   DG Chest Portable 1 View Result Date: 08/18/2023 CLINICAL DATA:  Short of breath, CHF EXAM: PORTABLE CHEST 1 VIEW COMPARISON:  03/01/2023 FINDINGS: Single frontal view of the chest demonstrates postsurgical changes from CABG. Cardiac silhouette is stable. Persistent bibasilar consolidation and effusions, right greater than left, not appreciably changed  since prior exam. Central pulmonary vascular congestion unchanged. No pneumothorax. IMPRESSION: 1. Stable pulmonary vascular congestion, bibasilar consolidation, and bilateral pleural effusions. Electronically Signed   By: Ozell Daring M.D.   On: 08/18/2023 16:21    Microbiology: Recent Results (from the past 240 hours)  Resp panel by RT-PCR (RSV, Flu A&B, Covid) Anterior Nasal Swab     Status: None   Collection Time: 09/12/23 12:09 PM   Specimen: Anterior Nasal Swab  Result Value Ref Range Status   SARS Coronavirus 2 by RT PCR NEGATIVE  NEGATIVE Final   Influenza A by PCR NEGATIVE NEGATIVE Final   Influenza B by PCR NEGATIVE NEGATIVE Final    Comment: (NOTE) The Xpert Xpress SARS-CoV-2/FLU/RSV plus assay is intended as an aid in the diagnosis of influenza from Nasopharyngeal swab specimens and should not be used as a sole basis for treatment. Nasal washings and aspirates are unacceptable for Xpert Xpress SARS-CoV-2/FLU/RSV testing.  Fact Sheet for Patients: BloggerCourse.com  Fact Sheet for Healthcare Providers: SeriousBroker.it  This test is not yet approved or cleared by the [place]  FDA and has been authorized for detection and/or diagnosis of SARS-CoV-2 by FDA under an Emergency Use Authorization (EUA). This EUA will remain in effect (meaning this test can be used) for the duration of the COVID-19 declaration under Section 564(b)(1) of the Act, 21 U.S.C. section 360bbb-3(b)(1), unless the authorization is terminated or revoked.     Resp Syncytial Virus by PCR NEGATIVE NEGATIVE Final    Comment: (NOTE) Fact Sheet for Patients: BloggerCourse.com  Fact Sheet for Healthcare Providers: SeriousBroker.it  This test is not yet approved or cleared by the [place]  FDA and has been authorized for detection and/or diagnosis of SARS-CoV-2 by FDA under an Emergency Use Authorization (EUA). This EUA will remain in effect (meaning this test can be used) for the duration of the COVID-19 declaration under Section 564(b)(1) of the Act, 21 U.S.C. section 360bbb-3(b)(1), unless the authorization is terminated or revoked.  Performed at [facility], [address], [place], [place] [address]   Urine Culture     Status: Abnormal   Collection Time: 09/12/23  3:08 PM   Specimen: Urine, Clean Catch  Result Value Ref Range Status   Specimen Description URINE, CLEAN CATCH  Final   Special Requests   Final     NONE Performed at [facility], [address], [place], [place] [address]    Culture >=100,000 COLONIES/mL KLEBSIELLA PNEUMONIAE (A)  Final   Report Status 09/15/2023 FINAL  Final   Organism ID, Bacteria KLEBSIELLA PNEUMONIAE (A)  Final      Susceptibility   Klebsiella pneumoniae - MIC*    AMPICILLIN  >=32 RESISTANT Resistant     CEFAZOLIN  (URINE) Value in next row Sensitive      2 SENSITIVEThis is a modified FDA-approved test that has been validated and its performance characteristics determined by the reporting laboratory.  This laboratory is certified under the Clinical Laboratory Improvement Amendments CLIA as qualified to perform high complexity clinical laboratory testing.    CEFEPIME Value in next row Sensitive      2 SENSITIVEThis is a modified FDA-approved test that has been validated and its performance characteristics determined by the reporting laboratory.  This laboratory is certified under the Clinical Laboratory Improvement Amendments CLIA as qualified to perform high complexity clinical laboratory testing.    ERTAPENEM Value in next row Sensitive      2 SENSITIVEThis is a modified FDA-approved test that has  been validated and its performance characteristics determined by the reporting laboratory.  This laboratory is certified under the Clinical Laboratory Improvement Amendments CLIA as qualified to perform high complexity clinical laboratory testing.    CEFTRIAXONE  Value in next row Sensitive      2 SENSITIVEThis is a modified FDA-approved test that has been validated and its performance characteristics determined by the reporting laboratory.  This laboratory is certified under the Clinical Laboratory Improvement Amendments CLIA as qualified to perform high complexity clinical laboratory testing.    CIPROFLOXACIN Value in next row Sensitive      2 SENSITIVEThis is a modified FDA-approved test that has been validated and its performance characteristics determined by the  reporting laboratory.  This laboratory is certified under the Clinical Laboratory Improvement Amendments CLIA as qualified to perform high complexity clinical laboratory testing.    GENTAMICIN Value in next row Sensitive      2 SENSITIVEThis is a modified FDA-approved test that has been validated and its performance characteristics determined by the reporting laboratory.  This laboratory is certified under the Clinical Laboratory Improvement Amendments CLIA as qualified to perform high complexity clinical laboratory testing.    NITROFURANTOIN Value in next row Intermediate      2 SENSITIVEThis is a modified FDA-approved test that has been validated and its performance characteristics determined by the reporting laboratory.  This laboratory is certified under the Clinical Laboratory Improvement Amendments CLIA as qualified to perform high complexity clinical laboratory testing.    TRIMETH /SULFA  Value in next row Sensitive      2 SENSITIVEThis is a modified FDA-approved test that has been validated and its performance characteristics determined by the reporting laboratory.  This laboratory is certified under the Clinical Laboratory Improvement Amendments CLIA as qualified to perform high complexity clinical laboratory testing.    AMPICILLIN /SULBACTAM Value in next row Sensitive      2 SENSITIVEThis is a modified FDA-approved test that has been validated and its performance characteristics determined by the reporting laboratory.  This laboratory is certified under the Clinical Laboratory Improvement Amendments CLIA as qualified to perform high complexity clinical laboratory testing.    PIP/TAZO Value in next row Sensitive ug/mL     <=4 SENSITIVEThis is a modified FDA-approved test that has been validated and its performance characteristics determined by the reporting laboratory.  This laboratory is certified under the Clinical Laboratory Improvement Amendments CLIA as qualified to perform high complexity  clinical laboratory testing.    MEROPENEM Value in next row Sensitive      <=4 SENSITIVEThis is a modified FDA-approved test that has been validated and its performance characteristics determined by the reporting laboratory.  This laboratory is certified under the Clinical Laboratory Improvement Amendments CLIA as qualified to perform high complexity clinical laboratory testing.    * >=100,000 COLONIES/mL KLEBSIELLA PNEUMONIAE     Labs: Basic Metabolic Panel: Recent Labs  Lab 09/12/23 1150 09/12/23 1156 09/13/23 0203 09/14/23 0158 09/15/23 0652  NA 132* 132* 135 135 134*  K 4.9 4.9 4.5 4.5 4.5  CL 100 100 106 109 107  CO2  --  19* 19* 20* 18*  GLUCOSE 268* 263* 181* 150* 260*  BUN 104* 100* 81* 56* 39*  CREATININE 1.50* 1.46* 1.08 1.10 0.84  CALCIUM   --  8.4* 8.2* 8.2* 8.5*   Liver Function Tests: Recent Labs  Lab 09/12/23 1156 09/13/23 0203 09/14/23 0158 09/15/23 0652  AST 22 20 22 25   ALT 22 20 21 21   ALKPHOS 153* 131* 119 146*  BILITOT 0.6 0.5 0.5 0.4  PROT 7.0 6.3* 6.0* 6.6  ALBUMIN 2.6* 2.2* 2.1* 2.2*   No results for input(s): LIPASE, AMYLASE in the last 168 hours. Recent Labs  Lab 09/12/23 1151  AMMONIA 19   CBC: Recent Labs  Lab 09/12/23 1150 09/12/23 1157 09/13/23 0203 09/14/23 0158 09/15/23 0652  WBC  --  8.0 6.8 6.3 7.5  NEUTROABS  --  5.7  --  4.5 5.8  HGB 10.9* 10.0* 9.0* 8.2* 9.5*  HCT 32.0* 33.1* 28.2* 26.9* 30.4*  MCV  --  90.2 86.2 89.4 87.4  PLT  --  228 204 208 210   Cardiac Enzymes: No results for input(s): CKTOTAL, CKMB, CKMBINDEX, TROPONINI in the last 168 hours. BNP: BNP (last 3 results) Recent Labs    02/06/23 0255 08/26/23 1107 09/12/23 1200  BNP 97.3 190.6* 184.1*    ProBNP (last 3 results) Recent Labs    03/01/23 1157 08/18/23 1610  PROBNP 399.0* 3,796.0*    CBG: Recent Labs  Lab 09/14/23 1626 09/15/23 0619 09/15/23 1216 09/15/23 2118 09/16/23 0614  GLUCAP 193* 240* 230* 224* 184*     Signed:  Colen Grimes MD   [facility] 09/16/2023, 9:13 AM

## 2023-09-16 NOTE — Telephone Encounter (Signed)
 Noted

## 2023-09-17 ENCOUNTER — Telehealth: Payer: Self-pay

## 2023-09-17 ENCOUNTER — Encounter: Payer: Self-pay | Admitting: Adult Health

## 2023-09-17 ENCOUNTER — Ambulatory Visit (INDEPENDENT_AMBULATORY_CARE_PROVIDER_SITE_OTHER): Admitting: Adult Health

## 2023-09-17 ENCOUNTER — Other Ambulatory Visit (HOSPITAL_COMMUNITY): Payer: Self-pay | Admitting: Physician Assistant

## 2023-09-17 VITALS — BP 119/67 | HR 77 | Ht 74.0 in | Wt 147.0 lb

## 2023-09-17 DIAGNOSIS — Z8673 Personal history of transient ischemic attack (TIA), and cerebral infarction without residual deficits: Secondary | ICD-10-CM

## 2023-09-17 DIAGNOSIS — R413 Other amnesia: Secondary | ICD-10-CM | POA: Diagnosis not present

## 2023-09-17 DIAGNOSIS — I5032 Chronic diastolic (congestive) heart failure: Secondary | ICD-10-CM

## 2023-09-17 NOTE — Telephone Encounter (Signed)
 Please advise if ok to send Rx to pharmacy

## 2023-09-17 NOTE — Transitions of Care (Post Inpatient/ED Visit) (Unsigned)
   09/17/2023  Name: [name] [name] MRN: [id number] DOB: [birth year]  Today's TOC FU Call Status: Today's TOC FU Call Status:: Unsuccessful Call (1st Attempt) Unsuccessful Call (1st Attempt) Date: 09/17/23  Attempted to reach the patient regarding the most recent Inpatient/ED visit.  Follow Up Plan: Additional outreach attempts will be made to reach the patient to complete the Transitions of Care (Post Inpatient/ED visit) call.   Signature Julian Lemmings, LPN [facility] Nurse Health Advisor Direct Dial  [phone]

## 2023-09-17 NOTE — Progress Notes (Signed)
 PATIENT: [name] [name] Reason DOB: Feb 01, 1955  REASON FOR VISIT: follow up HISTORY FROM: patient PRIMARY NEUROLOGIST: Dr. Rosemarie  Chief Complaint  Patient presents with   Follow-up    Pt in 18 with son Pt here for stroke f/u Pt is weak and gait is off      HISTORY OF PRESENT ILLNESS: Today 09/17/23:  [name] [name] is a 68 y.o. male with a history of right MCA watershed infarcts d/t hypotension, SKI, deydration. Returns today for follow-up.  Patient was just discharged from the hospital last night due to urinary tract infection.  He is back home.  He currently lives alone.  His son checks on him in the morning in the evenings.  But his son also has to travel out of town for his job.  He states he does have someone that checks on him while he is gone.  PCP is managing stroke risk factors.  Remains on Eliquis  but reports cardiology took him off of aspirin .  Patient denies any additional strokelike symptoms.  Son reports that the nephrologist in the hospital mentioned Parkinson's disease.  Patient denies tremor.  Denies any trouble chewing or swallowing food.  At home he is able to make food for himself or his son brings him food.  Son reports that he only gets to bathe him about once every 2 weeks.  Patient denies memory disturbance.  Memory score has slightly decreased since last seen.  Patient defers on any additional memory workup.     01/08/23: [name] is a 68 y.o. male here for hospital FU after right MCA 4-5 small watershed infarcts likely due to hypotension, AKI and dehydration.  Returns today for follow-up. He is currently is living at home alone. Son checks on him frequently. Has home health services coming in now. No deficits from stroke. Reports generalized weakness. Uses a rollator. Has seen PCP. BP is in normal range today. Cholesterol in normal range. HgbA1c high- seeing endocrinology. Son reports some short term memory issues. Reports long term memory is intact.     HISTORY Acute Ischemic Infarct:  right MCA 4-5 small watershed infarcts, pattern more likely due to hypotension, AKI and dehydration CT no acute finding CTA head & neck no LVO, bilateral ICA siphon, bulb and V4 athero MRI  Punctate foci of restricted diffusion in a watershed distribution in the right hemisphere compatible with acute infarcts. Stable remote lacunar infarct at the genu of the right internal capsule. Stable mild atrophy and white matter disease 2D Echo EF 55 to 60%. The aortic valve is tricuspid. 1.4 x 1 cm nodule on the noncoronary cusp, concern for vegetation  TEE EF 60 to 65%.  LA mildly dilated.  AV no vegetation seen LDL 52 HgbA1c 9.2 UDS neg VTE prophylaxis - anticoagulated with Eliquis  aspirin  81 mg daily and Eliquis  (apixaban ) daily prior to admission, now on home aspirin  81 mg and Eliquis   Therapy recommendations:  SNF Disposition: Pending    REVIEW OF SYSTEMS: Out of a complete 14 system review of symptoms, the patient complains only of the following symptoms, and all other reviewed systems are negative.  ALLERGIES: Allergies  Allergen Reactions   Other Hives and Itching    Wool fabric - hives, itching    HOME MEDICATIONS: Outpatient Medications Prior to Visit  Medication Sig Dispense Refill   acetaminophen  (TYLENOL ) 500 MG tablet Take 500 mg by mouth as needed for mild pain (pain score 1-3) or moderate pain (pain score 4-6).  atorvastatin  (LIPITOR) 40 MG tablet Take 1 tablet (40 mg total) by mouth daily. 90 tablet 1   Cholecalciferol  (VITAMIN D3) 50 MCG (2000 UT) capsule Take 2,000 Units by mouth daily.     ELIQUIS  5 MG TABS tablet TAKE 1 TABLET BY MOUTH TWICE A DAY 180 tablet 1   Furosemide  (LASIX  PO) Take by mouth as needed.     glipiZIDE  (GLUCOTROL ) 5 MG tablet Take 1 tablet (5 mg total) by mouth 2 (two) times daily before a meal. (Patient taking differently: Take 2.5-5 mg by mouth 2 (two) times daily before a meal. 1qam ssqpm) 180 tablet 3    Loperamide  HCl (IMODIUM  PO) Take by mouth as needed.     loratadine  (CLARITIN ) 10 MG tablet Take 10 mg by mouth daily.     losartan  (COZAAR ) 50 MG tablet Take 1 tablet (50 mg total) by mouth daily. 30 tablet 3   mirtazapine  (REMERON ) 15 MG tablet TAKE 1 TABLET BY MOUTH EVERYDAY AT BEDTIME 90 tablet 1   PARoxetine  (PAXIL ) 20 MG tablet Take 20 mg by mouth daily.     QUEtiapine  (SEROQUEL ) 25 MG tablet Take 25 mg by mouth at bedtime.     sitaGLIPtin  (JANUVIA ) 100 MG tablet Take 100 mg by mouth daily.     No facility-administered medications prior to visit.    PAST MEDICAL HISTORY: Past Medical History:  Diagnosis Date   Acute urinary retention 01/04/2022   Altered mental status 08/18/2023   Anxiety    Arthritis    Cataract    CHF (congestive heart failure) (HCC)    Constipation    pt states goes EOD- hard stools    Coronary artery disease    Depression    Diabetes mellitus without complication (HCC)    Diabetic retinopathy (HCC)    DKA (diabetic ketoacidoses)    Hyperlipidemia    Hypertension    off meds   Hypertensive retinopathy    Lesion of ulnar nerve, left upper limb 07/12/2017   Non-intractable vomiting without nausea 06/10/2018   Postherpetic neuralgia 08/11/2021   Speculative diagnosis   Psoriasis    Tubular adenoma of colon 03/2019   UTI (urinary tract infection) 01/05/2022    PAST SURGICAL HISTORY: Past Surgical History:  Procedure Laterality Date   ABDOMINAL AORTOGRAM W/LOWER EXTREMITY N/A 08/30/2020   Procedure: ABDOMINAL AORTOGRAM W/LOWER EXTREMITY;  Surgeon: Serene Gaile ORN, MD;  Location: [facility];  Service: Cardiovascular;  Laterality: N/A;   ABDOMINAL AORTOGRAM W/LOWER EXTREMITY N/A 11/15/2020   Procedure: ABDOMINAL AORTOGRAM W/LOWER EXTREMITY;  Surgeon: Serene Gaile ORN, MD;  Location: [facility];  Service: Cardiovascular;  Laterality: N/A;   ANKLE SURGERY     right   CARDIAC CATHETERIZATION  09/25/2001   COLONOSCOPY     ~10 yr ago-  normal  per pt    CORONARY ARTERY BYPASS GRAFT  09/30/2001   CABG x 4   IR THORACENTESIS ASP PLEURAL SPACE W/IMG GUIDE  05/19/2020   IR THORACENTESIS ASP PLEURAL SPACE W/IMG GUIDE  02/04/2023   LAPAROSCOPIC INGUINAL HERNIA REPAIR Bilateral 02/09/2010   MASS EXCISION Left 03/12/2013   Procedure: LEFT INDEX EXCISION MASS AND DEBRIDMENT DISTAL INTERPHALANGEAL JOINT;  Surgeon: Franky JONELLE Curia, MD;  Location: [facility];  Service: Orthopedics;  Laterality: Left;   PERIPHERAL VASCULAR BALLOON ANGIOPLASTY Left 08/30/2020   Procedure: PERIPHERAL VASCULAR BALLOON ANGIOPLASTY;  Surgeon: Serene Gaile ORN, MD;  Location: [facility];  Service: Cardiovascular;  Laterality: Left;  Peroneal  TRANSESOPHAGEAL ECHOCARDIOGRAM (CATH LAB) N/A 11/26/2022   Procedure: TRANSESOPHAGEAL ECHOCARDIOGRAM;  Surgeon: Kate Lonni CROME, MD;  Location: [facility]VASIVE CV LAB;  Service: Cardiovascular;  Laterality: N/A;    FAMILY HISTORY: Family History  Problem Relation Age of Onset   Lymphoma Father    Cancer Paternal Grandmother        Colon Cancer   Colon cancer Paternal Grandmother    Colon polyps Neg Hx    Esophageal cancer Neg Hx    Rectal cancer Neg Hx    Stomach cancer Neg Hx    Diabetes Mellitus I Neg Hx    Pancreatic cancer Neg Hx    Stroke Neg Hx    Dementia Neg Hx     SOCIAL HISTORY: Social History   Socioeconomic History   Marital status: Divorced    Spouse name: Not on file   Number of children: 4   Years of education: 12   Highest education level: 12th grade  Occupational History   Occupation: Retired  Tobacco Use   Smoking status: Some Days    Types: Cigars    Last attempt to quit: 2022    Years since quitting: 3.7   Smokeless tobacco: Never   Tobacco comments:    1-2 per week cigars  05/09/2023  Vaping Use   Vaping status: Never Used  Substance and Sexual Activity   Alcohol use: Not Currently    Comment: about once a year   Drug use: No   Sexual activity: Yes   Other Topics Concern   Not on file  Social History Narrative   Lives alone  son lives 3 doors down from patient    Has 4 children, 2 sons- both live in [place], 2 daughters one lives in [place],   and youngest daughter attends [employer].    Fiance lives in  [place], [place]   Retired    Social Drivers of Corporate investment banker Strain: Low Risk  (08/19/2023)   Overall Financial Resource Strain (CARDIA)    Difficulty of Paying Living Expenses: Not hard at all  Food Insecurity: No Food Insecurity (09/12/2023)   Hunger Vital Sign    Worried About Running Out of Food in the Last Year: Never true    Ran Out of Food in the Last Year: Never true  Recent Concern: Food Insecurity - Food Insecurity Present (08/18/2023)   Hunger Vital Sign    Worried About Running Out of Food in the Last Year: Sometimes true    Ran Out of Food in the Last Year: Sometimes true  Transportation Needs: No Transportation Needs (09/12/2023)   PRAPARE - Administrator, Civil Service (Medical): No    Lack of Transportation (Non-Medical): No  Physical Activity: Inactive (11/21/2022)   Exercise Vital Sign    Days of Exercise per Week: 0 days    Minutes of Exercise per Session: 0 min  Stress: Stress Concern Present (11/21/2022)   [employer] of Occupational Health - Occupational Stress Questionnaire    Feeling of Stress : Very much  Social Connections: Socially Isolated (09/12/2023)   Social Connection and Isolation Panel    Frequency of Communication with Friends and Family: Once a week    Frequency of Social Gatherings with Friends and Family: Once a week    Attends Religious Services: Never    Database administrator or Organizations: No    Attends Banker Meetings: Never    Marital Status: Divorced  Catering manager Violence:  Not At Risk (09/12/2023)   Humiliation, Afraid, Rape, and Kick questionnaire    Fear of Current or Ex-Partner: No    Emotionally  Abused: No    Physically Abused: No    Sexually Abused: No      PHYSICAL EXAM  Vitals:   09/17/23 0859  BP: 119/67  Pulse: 77  Weight: 147 lb (66.7 kg)  Height: 6' 2 (1.88 m)    Body mass index is 18.87 kg/m.     09/17/2023    9:01 AM 01/08/2023    9:51 AM 11/21/2022    1:40 PM  MMSE - Mini Mental State Exam  Not completed:   Unable to complete  Orientation to time 3 4   Orientation to Place 5 5   Registration 3 3   Attention/ Calculation 5 5   Recall 2 3   Language- name 2 objects 2 2   Language- repeat 1 1   Language- follow 3 step command 3 3   Language- read & follow direction 0 1   Write a sentence 0 1   Copy design 0 0   Total score 24 28      Generalized: Well developed, in no acute distress   Neurological examination  Mentation: Alert oriented to time, place, history taking. Follows all commands speech and language fluent Cranial nerve II-XII: Pupils were equal round reactive to light. Extraocular movements were full, visual field were full on confrontational test. Facial sensation and strength were normal. Uvula tongue midline. Head turning and shoulder shrug  were normal and symmetric. Motor: The motor testing reveals 5 over 5 strength of all 4 extremities. Good symmetric motor tone is noted throughout.  Finger taps moderately impaired in the upper extremities.  Rapid alternating movement moderately impaired.  Toe and heel taps mildly impaired.  No rigidity noted. Sensory: Sensory testing is intact to soft touch on all 4 extremities. No evidence of extinction is noted.  Coordination: Finger-nose-finger and heel-to-toe intact bilaterally Gait and station: Patient stands with one-person assist.  Uses a rollator when ambulating.  Good stride.  Normal  DIAGNOSTIC DATA (LABS, IMAGING, TESTING) - I reviewed patient records, labs, notes, testing and imaging myself where available.  Lab Results  Component Value Date   WBC 7.5 09/15/2023   HGB 9.5 (L)  09/15/2023   HCT 30.4 (L) 09/15/2023   MCV 87.4 09/15/2023   PLT 210 09/15/2023      Component Value Date/Time   NA 134 (L) 09/15/2023 0652   K 4.5 09/15/2023 0652   CL 107 09/15/2023 0652   CO2 18 (L) 09/15/2023 0652   GLUCOSE 260 (H) 09/15/2023 0652   BUN 39 (H) 09/15/2023 0652   CREATININE 0.84 09/15/2023 0652   CALCIUM  8.5 (L) 09/15/2023 0652   PROT 6.6 09/15/2023 0652   ALBUMIN 2.2 (L) 09/15/2023 0652   AST 25 09/15/2023 0652   ALT 21 09/15/2023 0652   ALKPHOS 146 (H) 09/15/2023 0652   BILITOT 0.4 09/15/2023 0652   GFRNONAA >60 09/15/2023 0652   GFRAA >60 06/09/2018 1647   Lab Results  Component Value Date   CHOL 117 11/30/2022   HDL 48 11/30/2022   LDLCALC 52 11/30/2022   LDLDIRECT 37.0 03/16/2020   TRIG 84 11/30/2022   CHOLHDL 2.4 11/30/2022   Lab Results  Component Value Date   HGBA1C 9.1 (H) 07/26/2023   Lab Results  Component Value Date   VITAMINB12 265 08/19/2023   Lab Results  Component Value Date   TSH  2.618 09/12/2023      ASSESSMENT AND PLAN 68 y.o. year old male  has a past medical history of Acute urinary retention (01/04/2022), Altered mental status (08/18/2023), Anxiety, Arthritis, Cataract, CHF (congestive heart failure) (HCC), Constipation, Coronary artery disease, Depression, Diabetes mellitus without complication (HCC), Diabetic retinopathy (HCC), DKA (diabetic ketoacidoses), Hyperlipidemia, Hypertension, Hypertensive retinopathy, Lesion of ulnar nerve, left upper limb (07/12/2017), Non-intractable vomiting without nausea (06/10/2018), Postherpetic neuralgia (08/11/2021), Psoriasis, Tubular adenoma of colon (03/2019), and UTI (urinary tract infection) (01/05/2022). here with:  Acute Ischemic Infarct:  right MCA 4-5 small watershed infarcts, pattern more likely due to hypotension, AKI and dehydration  Memory disturbance   Continue Eliquis  (apixaban ) daily  for secondary stroke prevention.  Discussed secondary stroke prevention measures and  importance of close PCP follow up for aggressive stroke risk factor management. I have gone over the pathophysiology of stroke, warning signs and symptoms, risk factors and their management in some detail with instructions to go to the closest emergency room for symptoms of concern. HTN: BP goal <130/90.  HLD: LDL goal <70.  DMII: A1c goal<7.0.  MMSE 24/30 deferred on any additional memory workup And reports he is concerned about Parkinson's disease-no tremor noted on exam.  No rigidity.  Good stride.  Some difficulty with finger taps and toe taps and rapid alternating movement.  Patient does not wish to do any further workup. I did have a long discussion with the patient's son.  I do feel that the patient is going to require more assistance to help with hygienic needs.  Son states that he does have a call with a company this week. FU with our office 8 to 10 months or sooner if needed      Duwaine Russell, MSN, NP-C 09/17/2023, 9:12 AM [facility] [address], [address] [place], [place] [address] [phone]  The patient's condition requires frequent monitoring and adjustments in the treatment plan, reflecting the ongoing complexity of care.  This provider is the continuing focal point for all needed services for this condition.

## 2023-09-17 NOTE — Patient Instructions (Signed)
 Your Plan:  Continue Eliquis  per cardiology Blood pressure goal <130/90 Cholesterol LDL goal <70 Diabetes goal A1c <7 Memory score 24/30 continue to monitor I do feel the patient is going to need more assistance in the home.  Would consider a adult day program or having an aide.  Thank you for coming to see us  at [facility]. I hope we have been able to provide you high quality care today.  You may receive a patient satisfaction survey over the next few weeks. We would appreciate your feedback and comments so that we may continue to improve ourselves and the health of our patients.

## 2023-09-18 ENCOUNTER — Telehealth: Payer: Self-pay | Admitting: Internal Medicine

## 2023-09-18 ENCOUNTER — Telehealth: Payer: Self-pay

## 2023-09-18 MED ORDER — LOSARTAN POTASSIUM 50 MG PO TABS: 50.0000 mg | ORAL_TABLET | Freq: Every day | ORAL | 1 refills | Status: DC

## 2023-09-18 NOTE — Telephone Encounter (Signed)
 Copied from CRM [id number]. Topic: General - Other >> Sep 17, 2023  2:44 PM Dedra B wrote: Reason for CRM: Pt son, Atilano Covelli, returning call for [employer]. Attempted to warm transfer but didn't get an answer.  Please see pt son returned call and asking for a call back at your convenience.

## 2023-09-18 NOTE — Telephone Encounter (Signed)
 Patient discharged by Dr Royal on 9/15.  Notified by RN that patient did not receive prescription of Losartan  50 mg daily.  Prescription sent to the pharmarcy for 30 pills with one refil   Adi Doro, MD

## 2023-09-18 NOTE — Telephone Encounter (Signed)
 FYI already sent today by another provider

## 2023-09-19 ENCOUNTER — Other Ambulatory Visit (HOSPITAL_BASED_OUTPATIENT_CLINIC_OR_DEPARTMENT_OTHER)

## 2023-09-19 ENCOUNTER — Telehealth: Payer: Self-pay | Admitting: *Deleted

## 2023-09-20 ENCOUNTER — Telehealth: Payer: Self-pay | Admitting: Gastroenterology

## 2023-09-20 NOTE — Telephone Encounter (Signed)
 Attempted to reach patient/son to provide information about another date for hospital endoscopy.  Advised in message that only date currently available is 12/19/23 at [facility] and we would need to hear back from them before proceeding with scheduling.

## 2023-09-20 NOTE — Telephone Encounter (Signed)
 Patients son returning call  Requesting a call back  Please advise  Thank you

## 2023-09-20 NOTE — Telephone Encounter (Signed)
 Second attempt to call back.  Again straight to VM.  Will not attempt again today.

## 2023-09-20 NOTE — Telephone Encounter (Signed)
 Inbound call from patient son stated that they need to reschedule his father procedure at the hospital due to scheduling conflict. Patient is scheduled for colonoscopy at the hospital on October the 14 th. Patient son is requesting a call back. Please advise.

## 2023-09-23 ENCOUNTER — Ambulatory Visit (HOSPITAL_COMMUNITY)
Admission: RE | Admit: 2023-09-23 | Discharge: 2023-09-23 | Disposition: A | Discharge status: Home / Self Care | Source: Ambulatory Visit | Attending: Cardiology | Admitting: Cardiology

## 2023-09-23 ENCOUNTER — Other Ambulatory Visit: Payer: Self-pay | Admitting: Physician Assistant

## 2023-09-23 DIAGNOSIS — I10 Essential (primary) hypertension: Secondary | ICD-10-CM

## 2023-09-23 DIAGNOSIS — I5032 Chronic diastolic (congestive) heart failure: Secondary | ICD-10-CM | POA: Diagnosis present

## 2023-09-23 LAB — MYOCARDIAL AMYLOID PLANAR & SPECT: H/CL Ratio: 1.1

## 2023-09-23 MED ORDER — TECHNETIUM TC 99M PYROPHOSPHATE
20.2000 | Freq: Once | INTRAVENOUS | Status: AC
  Administered 2023-09-23: 20.2 via INTRAVENOUS

## 2023-09-25 NOTE — Progress Notes (Signed)
 " Cardiology Office Note    Patient Name: [name] [name] Date of Encounter: 09/25/2023  Primary Care Provider:  [name], Mardy HERO, PA-C Primary Cardiologist:  Darryle ONEIDA Decent, MD Primary Electrophysiologist: None   Past Medical History    Past Medical History:  Diagnosis Date   Acute urinary retention 01/04/2022   Altered mental status 08/18/2023   Anxiety    Arthritis    Cataract    CHF (congestive heart failure) (HCC)    Constipation    pt states goes EOD- hard stools    Coronary artery disease    Depression    Diabetes mellitus without complication (HCC)    Diabetic retinopathy (HCC)    DKA (diabetic ketoacidoses)    Hyperlipidemia    Hypertension    off meds   Hypertensive retinopathy    Lesion of ulnar nerve, left upper limb 07/12/2017   Non-intractable vomiting without nausea 06/10/2018   Postherpetic neuralgia 08/11/2021   Speculative diagnosis   Psoriasis    Tubular adenoma of colon 03/2019   UTI (urinary tract infection) 01/05/2022    History of Present Illness  [name] [name] is a 68 y.o. male with a PMH of CAD CABG x 4 in 2003 with aneurysm repair, HLD, CVA 2024, DM type I, HFpEF, chronic atrial fibrillation, history of PAD s/p left peroneal artery angioplasty 11/2020 and right PT stenosis treated with angioplasty 11/2020 who presents today for hospital follow-up.  [name] [name] has a past medical history of CAD with CABG x 3 completed in 2003 with aortic dissection s/p aortic aneurysm repair in 2003.  He has been followed by Dr. Serene for management of PAD and underwent angioplasty of right posterior tibial artery and balloon angioplasty of the left peroneal artery.  He was admitted with acute diastolic heart failure and 02/2021 had a moderate left and right pleural effusion treated with IV diuresis.  He was noted to be bradycardic with heart rate in the 50s with occasional drops to the 40s.  He was also in atrial fibrillation and was placed on Eliquis   but self converted back to sinus rhythm but returned to AF at follow-up at heart failure clinic.  Seen in follow-up by Dr. Decent and was doing well overall and was back in sinus rhythm.  He was admitted back to the hospital on 01/04/22 for bradycardia and reported episodes of frequent falls.  Telemetry showed heart rate of 30-40 with AF with slow ventricular response and no indication for PPM.  He was placed on a 2-week event monitor and seen in follow-up on 02/27/2022 that showed AF 100% with a long pause of 3.4 seconds.  He was seen back in the ED on 11/20/2022 with elevated BP noted to be 233/102.  He completed a chest x-ray that showed significant vascular congestion with pulmonary edema.  He was also found to have intermittent delirium and was evaluated by neurology with MRI of the brain obtained showing watershed infarct.  He was evaluated by infectious disease and had a TEE completed that was negative for vegetation.  On 01/2023 with acute on chronic CHF with dyspnea and orthopnea.  BNP was elevated and x-ray showed moderate bilateral pleural effusions with thoracentesis completed draining 1.1 mL on the left and 1.8 mL on the right.  He was seen by Dr. Decent on 03/15/2023 for follow-up and had CT ordered as well as echocardiogram.  CT was completed showing moderate right side effusion with left layering pleural effusion.  He was referred to  pulmonology for recurrent effusions.  He was seen by pulmonology on 05/09/2023 who found that pulmonary hypertension exacerbated by hypoalbuminemia was the cause of repeat effusions secondary to cardiogenic volume overload.  They recommended no further thoracentesis unless symptoms changed.  He was encouraged to increase nutrition and protein supplementation as well as diuretic therapy.  He was last seen in office on 08/01/2023 for preoperative clearance visit.  He was pending vitrectomy as well as colonoscopy.  He was euvolemic on exam and any dyspnea on exertion.  He was  granted clearance for procedures.  He was seen back in the ED on 08/18/2023 with complaint of visual hallucinations and lower extremity edema.  2D echo was completed showing EF of 55 to 60% with no RWMA and moderate calcification of AV.  He was placed on Lasix  IV with improvement to volume status with chest x-ray showing improvement of right pleural effusion.  He presented back to the ED on 09/12/2023 with altered mental status.  He was noted to have a UTI and AKI with significant elevated BUN.  He was found to have mildly elevated BNP with CT of the head showing no acute intracranial abnormalities.  He was euvolemic on exam with concern for compliance with medications due to pressure injuries and living condition.  He had losartan  increased to 50 mg for BP control and had Lasix  and spironolactone  held in the setting of AKI.  [name] [name] presents today with his son for posthospital follow-up. His medications, including Lasix  and spironolactone , were held due to concerns about kidney function. Since discharge, no issues with shortness of breath or swelling have been noted. He has been drinking only water  and reports adequate urine output. His creatinine levels have been trending down. He has a history of chronic low sodium levels, which may be exacerbated by mild dehydration. He has stopped consuming salt and is considering low-sugar or sugar-free electrolyte drinks to manage his sodium levels. His son prepares meals with low salt content, including boiled eggs, oatmeal with protein, and seafood with corn. He has experienced significant weight loss, approximately 25 pounds this year, and is currently below his usual weight range of 160-170 pounds. He is receiving physical therapy at home to aid mobility and exercise. He has a history of hypoalbuminemia, which contributes to fluid retention. He consumes protein-rich foods like eggs to address this. No new cardiac symptoms such as heart rate issues or chest pain since  his last hospital discharge. He is currently taking metoprolol 50 mg for heart rate management and glipizide  2.5 mg in the evening for blood sugar control. His blood sugars have been stable recently. Patient denies chest pain, palpitations, dyspnea, PND, orthopnea, nausea, vomiting, dizziness, syncope, edema, weight gain, or early satiety.  Discussed the use of AI scribe software for clinical note transcription with the patient, who gave verbal consent to proceed.  History of Present Illness   Review of Systems  Please see the history of present illness.    All other systems reviewed and are otherwise negative except as noted above.  Physical Exam    Wt Readings from Last 3 Encounters:  09/17/23 147 lb (66.7 kg)  09/12/23 138 lb 14.2 oz (63 kg)  08/30/23 139 lb (63 kg)   CD:Uyzmz were no vitals filed for this visit.,There is no height or weight on file to calculate BMI. GEN: Thin and frail in no acute distress Neck: No JVD; No carotid bruits Pulmonary: Clear to auscultation without rales, wheezing or rhonchi  Cardiovascular:  Normal rate. Regular rhythm. Normal S1. Normal S2.   Murmurs: There is no murmur.  ABDOMEN: Soft, non-tender, non-distended EXTREMITIES:  No edema; No deformity   EKG/LABS/ Recent Cardiac Studies   ECG personally reviewed by me today -none completed today  Risk Assessment/Calculations:    CHA2DS2-VASc Score = 5   This indicates a 7.2% annual risk of stroke. The patient's score is based upon: CHF History: 1 HTN History: 1 Diabetes History: 1 Stroke History: 0 Vascular Disease History: 1 Age Score: 1 Gender Score: 0         Lab Results  Component Value Date   WBC 7.5 09/15/2023   HGB 9.5 (L) 09/15/2023   HCT 30.4 (L) 09/15/2023   MCV 87.4 09/15/2023   PLT 210 09/15/2023   Lab Results  Component Value Date   CREATININE 0.84 09/15/2023   BUN 39 (H) 09/15/2023   NA 134 (L) 09/15/2023   K 4.5 09/15/2023   CL 107 09/15/2023   CO2 18 (L)  09/15/2023   Lab Results  Component Value Date   CHOL 117 11/30/2022   HDL 48 11/30/2022   LDLCALC 52 11/30/2022   LDLDIRECT 37.0 03/16/2020   TRIG 84 11/30/2022   CHOLHDL 2.4 11/30/2022    Lab Results  Component Value Date   HGBA1C 9.1 (H) 07/26/2023   Assessment & Plan    Assessment & Plan  1.  Chronic diastolic CHF: -HFpEF EF 55-60%-baseline NYHA III with chronic bilateral pleural effusions secondary to pulmonary HTN - Follow-up well-managed with stable weight and no fluid overload. Improved kidney function supports holding diuretics. - Continue to hold Lasix  and spironolactone . - Monitor daily weights and report significant changes. - Limit total fluid intake to 64 ounces per day. - Encourage protein-rich foods. - Educated on signs of fluid overload and as-needed Lasix  use.  2.  History of CAD: -s/p CABG x 4 with aneurysm repair and 2003 No angina symptoms. Cardiovascular status stable. - Continue current GDMT with Lipitor 40 mg  3.  Persistent AF: - History of AF with SVR and today heart rate is controlled at 68 -Patient denies any palpitations or tachycardia. - Continue Eliquis  5 mg twice daily  4.  Essential hypertension: - Patient's blood pressure today was stable at 130/60 - Continue losartan  50 mg  5.  History of CVA: - History of right MCA watershed infarct due to hypotension - BP is stable with no residual complaints or concerns.  6.  History of PAD: -s/p left peroneal artery angioplasty 11/2020 and right PT stenosis treated with angioplasty 11/2020 - He is scheduled to establish care with Dr. Court in the next few weeks.     Disposition: Follow-up with Darryle ONEIDA Decent, MD as scheduled    Signed, Wyn Raddle, Jackee Shove, NP 09/25/2023, 5:03 PM [facility] "

## 2023-09-26 ENCOUNTER — Encounter: Payer: Self-pay | Admitting: Nurse Practitioner

## 2023-09-26 ENCOUNTER — Ambulatory Visit: Attending: Cardiology | Admitting: Nurse Practitioner

## 2023-09-26 VITALS — BP 130/60 | HR 68 | Ht 74.0 in | Wt 147.0 lb

## 2023-09-26 DIAGNOSIS — I4819 Other persistent atrial fibrillation: Secondary | ICD-10-CM

## 2023-09-26 DIAGNOSIS — I2510 Atherosclerotic heart disease of native coronary artery without angina pectoris: Secondary | ICD-10-CM

## 2023-09-26 DIAGNOSIS — I739 Peripheral vascular disease, unspecified: Secondary | ICD-10-CM | POA: Diagnosis not present

## 2023-09-26 DIAGNOSIS — I5032 Chronic diastolic (congestive) heart failure: Secondary | ICD-10-CM | POA: Diagnosis not present

## 2023-09-26 DIAGNOSIS — I10 Essential (primary) hypertension: Secondary | ICD-10-CM

## 2023-09-26 DIAGNOSIS — Z8673 Personal history of transient ischemic attack (TIA), and cerebral infarction without residual deficits: Secondary | ICD-10-CM

## 2023-09-26 NOTE — Patient Instructions (Signed)
 Medication Instructions:  Your physician recommends that you continue on your current medications as directed. Please refer to the Current Medication list given to you today. *If you need a refill on your cardiac medications before your next appointment, please call your pharmacy*  Lab Work: NONE ORDERED If you have labs (blood work) drawn today and your tests are completely normal, you will receive your results only by: MyChart Message (if you have MyChart) OR A paper copy in the mail If you have any lab test that is abnormal or we need to change your treatment, we will call you to review the results.  Testing/Procedures: NONE ORDERED  Follow-Up: At [facility], you and your health needs are our priority.  As part of our continuing mission to provide you with exceptional heart care, our providers are all part of one team.  This team includes your primary Cardiologist (physician) and Advanced Practice Providers or APPs (Physician Assistants and Nurse Practitioners) who all work together to provide you with the care you need, when you need it.  Your next appointment:   FOLLOW UP AS SCHEDULED   Provider:   Darryle ONEIDA Decent, MD    We recommend signing up for the patient portal called MyChart.  Sign up information is provided on this After Visit Summary.  MyChart is used to connect with patients for Virtual Visits (Telemedicine).  Patients are able to view lab/test results, encounter notes, upcoming appointments, etc.  Non-urgent messages can be sent to your provider as well.   To learn more about what you can do with MyChart, go to [email or web address].   Other Instructions

## 2023-09-27 ENCOUNTER — Inpatient Hospital Stay: Admitting: Physician Assistant

## 2023-09-27 ENCOUNTER — Ambulatory Visit (HOSPITAL_COMMUNITY): Payer: Self-pay | Admitting: Internal Medicine

## 2023-09-30 ENCOUNTER — Ambulatory Visit: Attending: Cardiovascular Disease | Admitting: Cardiovascular Disease

## 2023-09-30 ENCOUNTER — Encounter: Payer: Self-pay | Admitting: Cardiovascular Disease

## 2023-09-30 VITALS — BP 120/60 | HR 69 | Ht 74.0 in | Wt 146.0 lb

## 2023-09-30 DIAGNOSIS — I739 Peripheral vascular disease, unspecified: Secondary | ICD-10-CM

## 2023-09-30 NOTE — Patient Instructions (Signed)
 Medication Instructions:  Your physician recommends that you continue on your current medications as directed. Please refer to the Current Medication list given to you today.  *If you need a refill on your cardiac medications before your next appointment, please call your pharmacy*  Follow-Up: At [facility], you and your health needs are our priority.  As part of our continuing mission to provide you with exceptional heart care, our providers are all part of one team.  This team includes your primary Cardiologist (physician) and Advanced Practice Providers or APPs (Physician Assistants and Nurse Practitioners) who all work together to provide you with the care you need, when you need it.  Your next appointment:   We will see you on an as needed basis.  Provider:   Dorn Lesches, MD

## 2023-09-30 NOTE — Progress Notes (Signed)
 Patient was apparently referred for PAD.  His primary cardiologist is Dr. CLEMENTEEN MULCH.  He is accompanied by his son [name] today.  He has had aortic aneurysm repair by Dr. Serene in 2003.  He does not have a active open wound.  He had Doppler studies performed in our office 03/29/2023 revealing normal ABIs with no evidence of obstructive disease.  At this point, there is no need for further PAD visits at heart care.  He can follow-up with Dr. Serene as necessary.  Dorn DOROTHA Lesches, M.D., FACP, [facility], FAHA, [facility]  [address] [place], [place]  [address]  [phone] 09/30/2023 9:08 AM

## 2023-10-02 NOTE — Telephone Encounter (Signed)
 Inbound call from patient sone requesting to reschedule upcoming apt. Also requesting f/u call.

## 2023-10-03 NOTE — Progress Notes (Signed)
 I agree with the above plan

## 2023-10-08 ENCOUNTER — Telehealth: Payer: Self-pay

## 2023-10-08 NOTE — Telephone Encounter (Signed)
 Procedure:COLON Procedure date: 10/15/23 Procedure location: [facility] Arrival Time: 6:45 Spoke with the patient Y/N: N Any prep concerns? N  Has the patient obtained the prep from the pharmacy ? N Do you have a care partner and transportation: N Any additional concerns? N  I called patient on 3 different occasions and was unable to reach patient. I left a detailed message about the procedure and the office number in case patient has any questions are  concerns.

## 2023-10-08 NOTE — Telephone Encounter (Signed)
 Spoke with pts son and he needs to reschedule pts procedure due to transportation. Appt cancelled as requested. Will reschedule pt when next available hosp slot is out.

## 2023-10-08 NOTE — Telephone Encounter (Signed)
 Sorry to hear this - this is is second straight cancellation. He will have one opportunity to schedule again in the hospital, should he cancel again he will need to see me in the office if he wishes to continue care with us 

## 2023-10-12 ENCOUNTER — Other Ambulatory Visit: Payer: Self-pay | Admitting: Physician Assistant

## 2023-10-12 ENCOUNTER — Other Ambulatory Visit: Payer: Self-pay | Admitting: Cardiovascular Disease

## 2023-10-12 DIAGNOSIS — I480 Paroxysmal atrial fibrillation: Secondary | ICD-10-CM

## 2023-10-12 DIAGNOSIS — I5033 Acute on chronic diastolic (congestive) heart failure: Secondary | ICD-10-CM

## 2023-10-14 NOTE — Telephone Encounter (Signed)
 Patient requesting lasix  but medication shows as provider historical.

## 2023-10-14 NOTE — Telephone Encounter (Signed)
 Prescription refill request for Eliquis  received. Indication: AF Last office visit: 09/30/23  JINNY Lesches MD Scr: 0.84 on 09/15/23  Epic Age: 68 Weight: 66.2kg  Based on above findings Eliquis  5mg  twice daily is the appropriate dose.  Refill approved.

## 2023-10-15 ENCOUNTER — Ambulatory Visit (HOSPITAL_COMMUNITY): Admit: 2023-10-15 | Admitting: Gastroenterology

## 2023-10-15 ENCOUNTER — Encounter (HOSPITAL_COMMUNITY): Payer: Self-pay

## 2023-10-15 SURGERY — COLONOSCOPY
Anesthesia: Monitor Anesthesia Care

## 2023-10-23 ENCOUNTER — Other Ambulatory Visit: Payer: Self-pay

## 2023-10-23 ENCOUNTER — Inpatient Hospital Stay (HOSPITAL_COMMUNITY)
Admission: EM | Admit: 2023-10-23 | Discharge: 2023-11-06 | DRG: 082 | Disposition: A | Discharge status: Skilled Nursing Facility | Attending: Family Medicine | Admitting: Family Medicine

## 2023-10-23 ENCOUNTER — Emergency Department (HOSPITAL_COMMUNITY)

## 2023-10-23 ENCOUNTER — Inpatient Hospital Stay (HOSPITAL_COMMUNITY)

## 2023-10-23 DIAGNOSIS — I480 Paroxysmal atrial fibrillation: Secondary | ICD-10-CM | POA: Diagnosis present

## 2023-10-23 DIAGNOSIS — H5462 Unqualified visual loss, left eye, normal vision right eye: Secondary | ICD-10-CM | POA: Diagnosis present

## 2023-10-23 DIAGNOSIS — F331 Major depressive disorder, recurrent, moderate: Secondary | ICD-10-CM | POA: Diagnosis present

## 2023-10-23 DIAGNOSIS — N3000 Acute cystitis without hematuria: Secondary | ICD-10-CM | POA: Diagnosis present

## 2023-10-23 DIAGNOSIS — R0682 Tachypnea, not elsewhere classified: Secondary | ICD-10-CM | POA: Diagnosis present

## 2023-10-23 DIAGNOSIS — H21562 Pupillary abnormality, left eye: Secondary | ICD-10-CM | POA: Diagnosis present

## 2023-10-23 DIAGNOSIS — S065XAA Traumatic subdural hemorrhage with loss of consciousness status unknown, initial encounter: Secondary | ICD-10-CM | POA: Diagnosis present

## 2023-10-23 DIAGNOSIS — Z681 Body mass index (BMI) 19 or less, adult: Secondary | ICD-10-CM

## 2023-10-23 DIAGNOSIS — D649 Anemia, unspecified: Secondary | ICD-10-CM | POA: Diagnosis present

## 2023-10-23 DIAGNOSIS — E1151 Type 2 diabetes mellitus with diabetic peripheral angiopathy without gangrene: Secondary | ICD-10-CM | POA: Diagnosis present

## 2023-10-23 DIAGNOSIS — R64 Cachexia: Secondary | ICD-10-CM | POA: Diagnosis present

## 2023-10-23 DIAGNOSIS — R402242 Coma scale, best verbal response, confused conversation, at arrival to emergency department: Secondary | ICD-10-CM | POA: Diagnosis present

## 2023-10-23 DIAGNOSIS — L89152 Pressure ulcer of sacral region, stage 2: Secondary | ICD-10-CM | POA: Diagnosis present

## 2023-10-23 DIAGNOSIS — R4189 Other symptoms and signs involving cognitive functions and awareness: Secondary | ICD-10-CM | POA: Diagnosis present

## 2023-10-23 DIAGNOSIS — N179 Acute kidney failure, unspecified: Secondary | ICD-10-CM | POA: Diagnosis present

## 2023-10-23 DIAGNOSIS — F17290 Nicotine dependence, other tobacco product, uncomplicated: Secondary | ICD-10-CM | POA: Diagnosis present

## 2023-10-23 DIAGNOSIS — E43 Unspecified severe protein-calorie malnutrition: Secondary | ICD-10-CM | POA: Diagnosis present

## 2023-10-23 DIAGNOSIS — E11319 Type 2 diabetes mellitus with unspecified diabetic retinopathy without macular edema: Secondary | ICD-10-CM | POA: Diagnosis present

## 2023-10-23 DIAGNOSIS — R4181 Age-related cognitive decline: Secondary | ICD-10-CM | POA: Diagnosis not present

## 2023-10-23 DIAGNOSIS — R402142 Coma scale, eyes open, spontaneous, at arrival to emergency department: Secondary | ICD-10-CM | POA: Diagnosis present

## 2023-10-23 DIAGNOSIS — L8990 Pressure ulcer of unspecified site, unspecified stage: Secondary | ICD-10-CM | POA: Insufficient documentation

## 2023-10-23 DIAGNOSIS — I2510 Atherosclerotic heart disease of native coronary artery without angina pectoris: Secondary | ICD-10-CM | POA: Diagnosis present

## 2023-10-23 DIAGNOSIS — I739 Peripheral vascular disease, unspecified: Secondary | ICD-10-CM | POA: Diagnosis present

## 2023-10-23 DIAGNOSIS — Z951 Presence of aortocoronary bypass graft: Secondary | ICD-10-CM

## 2023-10-23 DIAGNOSIS — Z79899 Other long term (current) drug therapy: Secondary | ICD-10-CM | POA: Diagnosis not present

## 2023-10-23 DIAGNOSIS — Z91048 Other nonmedicinal substance allergy status: Secondary | ICD-10-CM

## 2023-10-23 DIAGNOSIS — Z818 Family history of other mental and behavioral disorders: Secondary | ICD-10-CM

## 2023-10-23 DIAGNOSIS — N390 Urinary tract infection, site not specified: Secondary | ICD-10-CM | POA: Insufficient documentation

## 2023-10-23 DIAGNOSIS — Z860101 Personal history of adenomatous and serrated colon polyps: Secondary | ICD-10-CM

## 2023-10-23 DIAGNOSIS — I110 Hypertensive heart disease with heart failure: Secondary | ICD-10-CM | POA: Diagnosis present

## 2023-10-23 DIAGNOSIS — S065X0A Traumatic subdural hemorrhage without loss of consciousness, initial encounter: Secondary | ICD-10-CM | POA: Diagnosis not present

## 2023-10-23 DIAGNOSIS — Z7984 Long term (current) use of oral hypoglycemic drugs: Secondary | ICD-10-CM | POA: Diagnosis not present

## 2023-10-23 DIAGNOSIS — W19XXXA Unspecified fall, initial encounter: Secondary | ICD-10-CM | POA: Diagnosis present

## 2023-10-23 DIAGNOSIS — R569 Unspecified convulsions: Secondary | ICD-10-CM | POA: Diagnosis not present

## 2023-10-23 DIAGNOSIS — R55 Syncope and collapse: Secondary | ICD-10-CM | POA: Diagnosis not present

## 2023-10-23 DIAGNOSIS — Z7901 Long term (current) use of anticoagulants: Secondary | ICD-10-CM | POA: Diagnosis not present

## 2023-10-23 DIAGNOSIS — Y92009 Unspecified place in unspecified non-institutional (private) residence as the place of occurrence of the external cause: Secondary | ICD-10-CM

## 2023-10-23 DIAGNOSIS — I5032 Chronic diastolic (congestive) heart failure: Secondary | ICD-10-CM | POA: Diagnosis present

## 2023-10-23 DIAGNOSIS — R54 Age-related physical debility: Secondary | ICD-10-CM | POA: Diagnosis present

## 2023-10-23 DIAGNOSIS — E785 Hyperlipidemia, unspecified: Secondary | ICD-10-CM | POA: Diagnosis present

## 2023-10-23 DIAGNOSIS — R402362 Coma scale, best motor response, obeys commands, at arrival to emergency department: Secondary | ICD-10-CM | POA: Diagnosis present

## 2023-10-23 DIAGNOSIS — Z9151 Personal history of suicidal behavior: Secondary | ICD-10-CM

## 2023-10-23 DIAGNOSIS — I482 Chronic atrial fibrillation, unspecified: Secondary | ICD-10-CM | POA: Diagnosis present

## 2023-10-23 DIAGNOSIS — D638 Anemia in other chronic diseases classified elsewhere: Secondary | ICD-10-CM | POA: Diagnosis present

## 2023-10-23 DIAGNOSIS — Z807 Family history of other malignant neoplasms of lymphoid, hematopoietic and related tissues: Secondary | ICD-10-CM

## 2023-10-23 DIAGNOSIS — I25810 Atherosclerosis of coronary artery bypass graft(s) without angina pectoris: Secondary | ICD-10-CM | POA: Diagnosis present

## 2023-10-23 DIAGNOSIS — I25812 Atherosclerosis of bypass graft of coronary artery of transplanted heart without angina pectoris: Secondary | ICD-10-CM | POA: Diagnosis present

## 2023-10-23 DIAGNOSIS — Z800 Family history of malignant neoplasm of digestive organs: Secondary | ICD-10-CM

## 2023-10-23 DIAGNOSIS — F411 Generalized anxiety disorder: Secondary | ICD-10-CM | POA: Diagnosis present

## 2023-10-23 DIAGNOSIS — R112 Nausea with vomiting, unspecified: Secondary | ICD-10-CM | POA: Diagnosis present

## 2023-10-23 LAB — I-STAT CHEM 8, ED
BUN: 56 mg/dL — ABNORMAL HIGH (ref 8–23)
Calcium, Ion: 1.08 mmol/L — ABNORMAL LOW (ref 1.15–1.40)
Chloride: 102 mmol/L (ref 98–111)
Creatinine, Ser: 1.1 mg/dL (ref 0.61–1.24)
Glucose, Bld: 279 mg/dL — ABNORMAL HIGH (ref 70–99)
HCT: 30 % — ABNORMAL LOW (ref 39.0–52.0)
Hemoglobin: 10.2 g/dL — ABNORMAL LOW (ref 13.0–17.0)
Potassium: 5 mmol/L (ref 3.5–5.1)
Sodium: 135 mmol/L (ref 135–145)
TCO2: 23 mmol/L (ref 22–32)

## 2023-10-23 LAB — URINALYSIS, ROUTINE W REFLEX MICROSCOPIC
Specific Gravity, Urine: 1.025 (ref 1.005–1.030)
pH: 6.5 (ref 5.0–8.0)

## 2023-10-23 LAB — COMPREHENSIVE METABOLIC PANEL WITH GFR
ALT: 16 U/L (ref 0–44)
AST: 22 U/L (ref 15–41)
Albumin: 2.6 g/dL — ABNORMAL LOW (ref 3.5–5.0)
Alkaline Phosphatase: 179 U/L — ABNORMAL HIGH (ref 38–126)
Anion gap: 10 (ref 5–15)
BUN: 50 mg/dL — ABNORMAL HIGH (ref 8–23)
CO2: 19 mmol/L — ABNORMAL LOW (ref 22–32)
Calcium: 8.5 mg/dL — ABNORMAL LOW (ref 8.9–10.3)
Chloride: 103 mmol/L (ref 98–111)
Creatinine, Ser: 1.19 mg/dL (ref 0.61–1.24)
GFR, Estimated: >60 mL/min (ref >60)
Glucose, Bld: 277 mg/dL — ABNORMAL HIGH (ref 70–99)
Potassium: 5 mmol/L (ref 3.5–5.1)
Sodium: 132 mmol/L — ABNORMAL LOW (ref 135–145)
Total Bilirubin: 0.8 mg/dL (ref 0.0–1.2)
Total Protein: 7.7 g/dL (ref 6.5–8.1)

## 2023-10-23 LAB — CBC WITH DIFFERENTIAL/PLATELET
Abs Immature Granulocytes: 0.04 K/uL (ref 0.00–0.07)
Basophils Absolute: 0 K/uL (ref 0.0–0.1)
Basophils Relative: 0 %
Eosinophils Absolute: 0 K/uL (ref 0.0–0.5)
Eosinophils Relative: 0 %
HCT: 29.2 % — ABNORMAL LOW (ref 39.0–52.0)
Hemoglobin: 9.3 g/dL — ABNORMAL LOW (ref 13.0–17.0)
Immature Granulocytes: 0 %
Lymphocytes Relative: 6 %
Lymphs Abs: 0.6 K/uL — ABNORMAL LOW (ref 0.7–4.0)
MCH: 27.5 pg (ref 26.0–34.0)
MCHC: 31.8 g/dL (ref 30.0–36.0)
MCV: 86.4 fL (ref 80.0–100.0)
Monocytes Absolute: 0.7 K/uL (ref 0.1–1.0)
Monocytes Relative: 7 %
Neutro Abs: 9.4 K/uL — ABNORMAL HIGH (ref 1.7–7.7)
Neutrophils Relative %: 87 %
Platelets: 239 K/uL (ref 150–400)
RBC: 3.38 MIL/uL — ABNORMAL LOW (ref 4.22–5.81)
RDW: 15.6 % — ABNORMAL HIGH (ref 11.5–15.5)
WBC: 10.8 K/uL — ABNORMAL HIGH (ref 4.0–10.5)
nRBC: 0 % (ref 0.0–0.2)

## 2023-10-23 LAB — URINALYSIS, MICROSCOPIC (REFLEX)
RBC / HPF: >50 RBC/hpf (ref 0–5)
Squamous Epithelial / HPF: NONE SEEN /HPF (ref 0–5)
WBC, UA: >50 WBC/hpf (ref 0–5)

## 2023-10-23 LAB — I-STAT VENOUS BLOOD GAS, ED
Acid-base deficit: 1 mmol/L (ref 0.0–2.0)
Bicarbonate: 23.5 mmol/L (ref 20.0–28.0)
Calcium, Ion: 1.11 mmol/L — ABNORMAL LOW (ref 1.15–1.40)
HCT: 29 % — ABNORMAL LOW (ref 39.0–52.0)
Hemoglobin: 9.9 g/dL — ABNORMAL LOW (ref 13.0–17.0)
O2 Saturation: 94 %
Potassium: 5.1 mmol/L (ref 3.5–5.1)
Sodium: 135 mmol/L (ref 135–145)
TCO2: 25 mmol/L (ref 22–32)
pCO2, Ven: 38.4 mmHg — ABNORMAL LOW (ref 44–60)
pH, Ven: 7.395 (ref 7.25–7.43)
pO2, Ven: 73 mmHg — ABNORMAL HIGH (ref 32–45)

## 2023-10-23 LAB — PROTIME-INR
INR: 1.8 — ABNORMAL HIGH (ref 0.8–1.2)
Prothrombin Time: 21.5 s — ABNORMAL HIGH (ref 11.4–15.2)

## 2023-10-23 LAB — CBG MONITORING, ED: Glucose-Capillary: 146 mg/dL — ABNORMAL HIGH (ref 70–99)

## 2023-10-23 LAB — CK: Total CK: 280 U/L (ref 49–397)

## 2023-10-23 LAB — MAGNESIUM: Magnesium: 1.7 mg/dL (ref 1.7–2.4)

## 2023-10-23 MED ORDER — SODIUM CHLORIDE 0.9 % IV SOLN: INTRAVENOUS | Status: AC

## 2023-10-23 MED ORDER — LOSARTAN POTASSIUM 50 MG PO TABS
50.0000 mg | ORAL_TABLET | Freq: Every day | ORAL | Status: DC
  Administered 2023-10-24 – 2023-10-27 (×4): 50 mg via ORAL
  Filled 2023-10-23 (×4): qty 1

## 2023-10-23 MED ORDER — ATORVASTATIN CALCIUM 40 MG PO TABS
40.0000 mg | ORAL_TABLET | Freq: Every day | ORAL | Status: DC
  Administered 2023-10-24 – 2023-11-06 (×14): 40 mg via ORAL
  Filled 2023-10-23 (×2): qty 1
  Filled 2023-10-23: qty 4
  Filled 2023-10-23 (×11): qty 1

## 2023-10-23 MED ORDER — INSULIN ASPART 100 UNIT/ML IJ SOLN
0.0000 [IU] | Freq: Three times a day (TID) | INTRAMUSCULAR | Status: DC
  Administered 2023-10-23: 3 [IU] via SUBCUTANEOUS
  Administered 2023-10-24: 2 [IU] via SUBCUTANEOUS
  Administered 2023-10-24 – 2023-10-25 (×4): 3 [IU] via SUBCUTANEOUS
  Administered 2023-10-26 (×2): 5 [IU] via SUBCUTANEOUS
  Administered 2023-10-26: 2 [IU] via SUBCUTANEOUS
  Administered 2023-10-27 (×2): 3 [IU] via SUBCUTANEOUS
  Administered 2023-10-27: 8 [IU] via SUBCUTANEOUS
  Administered 2023-10-28: 5 [IU] via SUBCUTANEOUS
  Administered 2023-10-28: 8 [IU] via SUBCUTANEOUS

## 2023-10-23 MED ORDER — PAROXETINE HCL 20 MG PO TABS
20.0000 mg | ORAL_TABLET | Freq: Every day | ORAL | Status: DC
Start: 2023-10-24 — End: 2023-10-27
  Administered 2023-10-24 – 2023-10-27 (×4): 20 mg via ORAL
  Filled 2023-10-23 (×4): qty 1

## 2023-10-23 MED ORDER — LOSARTAN POTASSIUM 50 MG PO TABS
50.0000 mg | ORAL_TABLET | Freq: Once | ORAL | Status: AC
  Administered 2023-10-23: 50 mg via ORAL
  Filled 2023-10-23: qty 1

## 2023-10-23 NOTE — H&P (Signed)
 History and Physical    Patient: [name] [name] FMW:[id number] DOB: 1955/04/29 DOA: 10/23/2023 DOS: the patient was seen and examined on 10/23/2023 PCP: [name], Mardy HERO, PA-C  Patient coming from: Home  Chief Complaint:  Chief Complaint  Patient presents with   Fall   HPI: [name] [name] is a 68 y.o. male with medical history significant of paroxysmal A-fib, chronic bilateral pleural effusion, diastolic CHF, CAD s/p CABG, PAD with balloon angioplasty, type 2 diabetes mellitus, and chronic decubitus ulcer p/w acute L SDH (4mm; no midline shift).  Pt is unable to provide medical history and family no longer at the bedside. From what I can gather per Epic review, pt states that he lives at home independently with his son visiting in the morning and at night.  Last night he remembers going to bed in his recliner which is normal for him.  This morning his son found him on the floor, surrounded by emesis and stool.  Patient does not know what happened, remembers waking up on the floor to his son.  Denies any acute complaints.  In the ED, pt hypertensive and tachypneic on RA. Labs notable for Cr 1.1 (baseline 0.8 on 9/14), and UA with bacteruria. EDP consulted NSGY and requested medicine admission.   Review of Systems: As mentioned in the history of present illness. All other systems reviewed and are negative. Past Medical History:  Diagnosis Date   Acute urinary retention 01/04/2022   Altered mental status 08/18/2023   Anxiety    Arthritis    Cataract    CHF (congestive heart failure) (HCC)    Constipation    pt states goes EOD- hard stools    Coronary artery disease    Depression    Diabetes mellitus without complication (HCC)    Diabetic retinopathy (HCC)    DKA (diabetic ketoacidoses)    Hyperlipidemia    Hypertension    off meds   Hypertensive retinopathy    Lesion of ulnar nerve, left upper limb 07/12/2017   Non-intractable vomiting without nausea 06/10/2018    Postherpetic neuralgia 08/11/2021   Speculative diagnosis   Psoriasis    Tubular adenoma of colon 03/2019   UTI (urinary tract infection) 01/05/2022   Past Surgical History:  Procedure Laterality Date   ABDOMINAL AORTOGRAM W/LOWER EXTREMITY N/A 08/30/2020   Procedure: ABDOMINAL AORTOGRAM W/LOWER EXTREMITY;  Surgeon: Serene Gaile ORN, MD;  Location: [facility];  Service: Cardiovascular;  Laterality: N/A;   ABDOMINAL AORTOGRAM W/LOWER EXTREMITY N/A 11/15/2020   Procedure: ABDOMINAL AORTOGRAM W/LOWER EXTREMITY;  Surgeon: Serene Gaile ORN, MD;  Location: [facility];  Service: Cardiovascular;  Laterality: N/A;   ANKLE SURGERY     right   CARDIAC CATHETERIZATION  09/25/2001   COLONOSCOPY     ~10 yr ago- normal  per pt    CORONARY ARTERY BYPASS GRAFT  09/30/2001   CABG x 4   IR THORACENTESIS ASP PLEURAL SPACE W/IMG GUIDE  05/19/2020   IR THORACENTESIS ASP PLEURAL SPACE W/IMG GUIDE  02/04/2023   LAPAROSCOPIC INGUINAL HERNIA REPAIR Bilateral 02/09/2010   MASS EXCISION Left 03/12/2013   Procedure: LEFT INDEX EXCISION MASS AND DEBRIDMENT DISTAL INTERPHALANGEAL JOINT;  Surgeon: Franky JONELLE Curia, MD;  Location: [facility];  Service: Orthopedics;  Laterality: Left;   PERIPHERAL VASCULAR BALLOON ANGIOPLASTY Left 08/30/2020   Procedure: PERIPHERAL VASCULAR BALLOON ANGIOPLASTY;  Surgeon: Serene Gaile ORN, MD;  Location: [facility];  Service: Cardiovascular;  Laterality: Left;  Peroneal  TRANSESOPHAGEAL ECHOCARDIOGRAM (CATH LAB) N/A 11/26/2022   Procedure: TRANSESOPHAGEAL ECHOCARDIOGRAM;  Surgeon: Kate Lonni CROME, MD;  Location: [facility];  Service: Cardiovascular;  Laterality: N/A;   Social History:  reports that he has been smoking cigars. He has never used smokeless tobacco. He reports that he does not currently use alcohol. He reports that he does not use drugs.  Allergies  Allergen Reactions   Other Hives and Itching    Wool fabric - hives, itching     Family History  Problem Relation Age of Onset   Lymphoma Father    Cancer Paternal Grandmother        Colon Cancer   Colon cancer Paternal Grandmother    Colon polyps Neg Hx    Esophageal cancer Neg Hx    Rectal cancer Neg Hx    Stomach cancer Neg Hx    Diabetes Mellitus I Neg Hx    Pancreatic cancer Neg Hx    Stroke Neg Hx    Dementia Neg Hx     Prior to Admission medications   Medication Sig Start Date End Date Taking? Authorizing Provider  acetaminophen  (TYLENOL ) 500 MG tablet Take 500 mg by mouth as needed for mild pain (pain score 1-3) or moderate pain (pain score 4-6).    [provider]  atorvastatin  (LIPITOR) 40 MG tablet Take 1 tablet (40 mg total) by mouth daily. 08/30/23   [name], Mardy HERO, PA-C  Cholecalciferol  (VITAMIN D3) 50 MCG (2000 UT) capsule Take 2,000 Units by mouth daily.    [provider]  ELIQUIS  5 MG TABS tablet TAKE 1 TABLET BY MOUTH TWICE A DAY 10/14/23   Krasowski, Robert J, MD  Furosemide  (LASIX  PO) Take by mouth as needed.    [provider]  glipiZIDE  (GLUCOTROL ) 5 MG tablet Take 1 tablet (5 mg total) by mouth 2 (two) times daily before a meal. Patient taking differently: Take 2.5-5 mg by mouth 2 (two) times daily before a meal. 1qam ssqpm 08/22/23   Shamleffer, Donell Cardinal, MD  Loperamide  HCl (IMODIUM  PO) Take by mouth as needed.    [provider]  loratadine  (CLARITIN ) 10 MG tablet Take 10 mg by mouth daily.    [provider]  losartan  (COZAAR ) 50 MG tablet Take 1 tablet (50 mg total) by mouth daily. 09/18/23 10/18/23  Akula, Vijaya, MD  mirtazapine  (REMERON ) 15 MG tablet TAKE 1 TABLET BY MOUTH EVERYDAY AT BEDTIME 04/23/23   [name], [name], PA-C  PARoxetine  (PAXIL ) 20 MG tablet Take 20 mg by mouth daily.    [provider]  QUEtiapine  (SEROQUEL ) 25 MG tablet Take 25 mg by mouth at bedtime. 09/16/23   [provider]  sitaGLIPtin  (JANUVIA ) 100 MG tablet Take 100 mg by mouth  daily.    [provider]    Physical Exam: Vitals:   10/23/23 1254 10/23/23 1345 10/23/23 1430 10/23/23 1445  BP:  (!) 149/90 98/64 (!) 165/94  Pulse:  62 (!) 56 62  Resp:  16 15 (!) 24  Temp: 98.7 F (37.1 C)     TempSrc:      SpO2:  100% 100% 97%   General: Alert, oriented x3, resting comfortably in no acute distress HEENT: EOMI, oropharynx clear, moist mucous membranes, hearing intact Neck: Trachea midline and no gross thyromegaly Respiratory: Lungs clear to auscultation bilaterally with normal respiratory effort; no w/r/r Cardiovascular: Regular rate and rhythm w/o m/r/g Abdomen: Soft, nontender, nondistended. Positive bowel sounds MSK: No obvious joint deformities or  swelling Skin: No obvious rashes or lesions Neurologic: Awake, alert, spontaneously moves all extremities, strength intact Psychiatric: Appropriate mood and affect, conversational and cooperative   Data Reviewed:  Lab Results  Component Value Date   WBC 10.8 (H) 10/23/2023   HGB 9.9 (L) 10/23/2023   HCT 29.0 (L) 10/23/2023   MCV 86.4 10/23/2023   PLT 239 10/23/2023   Lab Results  Component Value Date   GLUCOSE 279 (H) 10/23/2023   CALCIUM  8.5 (L) 10/23/2023   NA 135 10/23/2023   K 5.1 10/23/2023   CO2 19 (L) 10/23/2023   CL 102 10/23/2023   BUN 56 (H) 10/23/2023   CREATININE 1.10 10/23/2023   Lab Results  Component Value Date   ALT 16 10/23/2023   AST 22 10/23/2023   ALKPHOS 179 (H) 10/23/2023   BILITOT 0.8 10/23/2023   Lab Results  Component Value Date   INR 1.8 (H) 10/23/2023   Radiology: DG Chest Portable 1 View Result Date: 10/23/2023 EXAM: 1 VIEW XRAY OF THE CHEST 10/23/2023 10:01:00 AM COMPARISON: Chest radiograph dated 09/12/2023. CLINICAL HISTORY: Pt had fall last night around 2100. Was found on the floor this morning. FINDINGS: LUNGS AND PLEURA: Redemonstrated partially loculated small right pleural effusion and small left pleural effusion. No appreciable acute airspace  consolidation. No pneumothorax. HEART AND MEDIASTINUM: Stable cardiomediastinal silhouette. Status post CABG in median sternotomy. Aortic atherosclerosis. BONES AND SOFT TISSUES: No acute osseous abnormality identified. IMPRESSION: 1. Partially loculated small right pleural effusion and small left pleural effusion, not significantly changed compared to the prior exam. No appreciable acute abnormality. Electronically signed by: Harrietta Sherry MD 10/23/2023 10:32 AM EDT RP Workstation: [id number]   DG Pelvis Portable Result Date: 10/23/2023 EXAM: 1 or 2 VIEW(S) XRAY OF THE PELVIS 10/23/2023 10:01:00 AM COMPARISON: 05/16/2020 CLINICAL HISTORY: Patient had fall last night around 2100. Was found on the floor this morning. FINDINGS: BONES AND JOINTS: Diffuse osseous demineralization. No acute fracture. No joint dislocation. Femoral heads are seated within the acetabula. Mild osteoarthritis of the bilateral hips. SOFT TISSUES: Aortoiliac atherosclerotic calcification. IMPRESSION: 1. No acute osseous abnormality. Electronically signed by: Shahmeer Sherry MD 10/23/2023 10:26 AM EDT RP Workstation: [id number]   CT Cervical Spine Wo Contrast Result Date: 10/23/2023 EXAM: CT CERVICAL SPINE WITHOUT CONTRAST 10/23/2023 09:48:42 AM TECHNIQUE: CT of the cervical spine was performed without the administration of intravenous contrast. Multiplanar reformatted images are provided for review. Automated exposure control, iterative reconstruction, and/or weight-based adjustment of the mA/kV was utilized to reduce the radiation dose to as low as reasonably achievable. COMPARISON: Cervical spine CT 05/16/2020. CLINICAL HISTORY: 77-Year-Old Male, Polytrauma. FINDINGS: CERVICAL SPINE: BONES AND ALIGNMENT: Stable cervical lordosis.  No acute findings. DEGENERATIVE CHANGES: Chronic upper cervical facet arthropathy on the left and lower cervical disc and endplate degeneration at C5-C6 and C6-C7. SOFT TISSUES: No prevertebral soft tissue  swelling. IMPRESSION: 1. No acute traumatic injury identified in the cervical spine. Electronically signed by: Helayne Hurst MD 10/23/2023 10:03 AM EDT RP Workstation: [id number]   CT Head Wo Contrast Result Date: 10/23/2023 EXAM: CT HEAD WITHOUT CONTRAST 10/23/2023 09:48:42 AM TECHNIQUE: CT of the head was performed without the administration of intravenous contrast. Automated exposure control, iterative reconstruction, and/or weight based adjustment of the mA/kV was utilized to reduce the radiation dose to as low as reasonably achievable. COMPARISON: 09/12/2023 CLINICAL HISTORY: Polytrauma, blunt. Pt bib gcems from home, lives with son. Pt had fall last night around 2100. Was found on the floor this morning covered in feces and  vomit. Alert to self only. Takes eliquis , no obvious head trauma or injuries. Abrasion and redness to right knee, redness to right chest FINDINGS: BRAIN AND VENTRICLES: . No evidence of acute infarct. No hydrocephalus. No mass effect or midline shift. Diffuse cerebral volume loss. Chronic ischemic white matter disease. Acute subdural hematoma along left frontal convexity up to 4 mm in maximum thickness. Calcified atherosclerotic plaque within cavernous/supraclinoid internal carotid arteries. ORBITS: Bilateral lens replacement. SINUSES: No acute abnormality. SOFT TISSUES AND SKULL: No acute soft tissue abnormality. No skull fracture. Traumatic Brain Injury Risk Stratification ----- Skull Fracture: No (Low - mBIG 1) Subdural Hematoma (SDH): up to 4 mm in thickness (mBIG 2). Subarachnoid Hemorrhage [facility]): No Epidural Hematoma (EDH): No (Low - mBIG 1) Cerebral contusion, intra-axial, intraparenchymal Hemorrhage (IPH): No Intraventricular Hemorrhage (IVH): No (Low - mBIG 1) Midline Shift > 1mm or Edema/effacement of sulci/vents: No (Low - mBIG 1) IMPRESSION: 1. Acute left frontal convexity subdural hematoma measuring up to 4 mm in thickness. No midline shift or significant intracranial mass  effect. Electronically signed by: Helayne Hurst MD 10/23/2023 09:57 AM EDT RP Workstation: [id number]    Assessment and Plan: 55M h/o paroxysmal A-fib (on Eliquis ), chronic bilateral pleural effusion, diastolic CHF, CAD s/p CABG, PAD with balloon angioplasty, type 2 diabetes mellitus, and chronic decubitus ulcer p/w acute L SDH (4mm; no midline shift).  L SDH 4mm; no midline shift on CT head -NSGY consulted; apprec eval/recs (repeat CTH due at 1600; no need to reverse at this time) -HOLD pta Eliquis  for now  Syncope Found down with SDH; high suspicion for true syncope -PT/OT consulted; apprec eval/recs -Tele -MIVF: NS at 50cc/h for 24h -F/u orthostatic vitals -F/u admission blood cultures (low suspicion for bacteremia) -F/u EtOH level -F/u EEG; if abnl consult Neurology -F/u TTE to eval for structural/valvular heart disease -Consider 14d Holter monitor on discharge if no other obvious etiology  AKI Cr 1.1; baseline 0.8 -MIVF: NS at 50cc/h for 24h -Strict I&Os and daily weights (standing preferred) -F/u BMP daily -Renally dose medications for CrCl -Avoid lovenox, NSAIDs, morphine , Fleet's phosphate enema, regular insulin , contrast; no gadolinium for MRI to avoid nephrogenic systemic fibrosis -Consider renal US  and nephrology consult if worsening AKI  Acute cystitis UA w/ bacteruria -IV CTX 1g daily for now  HTN -PTA losartan   50mg  daily  HLD -PTA atorvastatin  40mg  daily   Advance Care Planning:   Code Status: Full Code   Consults: N/A  Family Communication: Son  Severity of Illness: The appropriate patient status for this patient is INPATIENT. Inpatient status is judged to be reasonable and necessary in order to provide the required intensity of service to ensure the patient's safety. The patient's presenting symptoms, physical exam findings, and initial radiographic and laboratory data in the context of their chronic comorbidities is felt to place them at high risk for  further clinical deterioration. Furthermore, it is not anticipated that the patient will be medically stable for discharge from the hospital within 2 midnights of admission.   * I certify that at the point of admission it is my clinical judgment that the patient will require inpatient hospital care spanning beyond 2 midnights from the point of admission due to high intensity of service, high risk for further deterioration and high frequency of surveillance required.*  ------- I spent 56 minutes reviewing previous notes, at the bedside counseling/discussing the treatment plan, and performing clinical documentation.  Author: Marsha Ada, MD 10/23/2023 3:39 PM  For on call review [email or web address].

## 2023-10-23 NOTE — ED Notes (Signed)
 PT report left eye blindness at baseline. Pt requested to call son and provided phone number, no answer left voicemail

## 2023-10-23 NOTE — ED Notes (Signed)
 Paitnet is going to CT

## 2023-10-23 NOTE — ED Triage Notes (Addendum)
 Pt bib gcems from home, lives with son. Pt had fall last night around 2100. Was found on the floor this morning covered in feces and vomit. Alert to self only. Takes eliquis , no obvious head trauma or injuries. C/o no pain. Recently admitted for FTT, hx of UTIs. Abrasion and redness to right knee, redness to right chest  142/76 54 hr 330 CBG 97% ra 26 capnography 26 rr  18G r fa of LR

## 2023-10-23 NOTE — ED Notes (Signed)
 Assisted pt to use room phone to call son

## 2023-10-23 NOTE — Progress Notes (Signed)
 CT reviewed which shows a very small left SDH with no mass effect or midline shift. He is on Eliquis  so please hold this. Repeat scan in 6 hours. No need to reverse it at this time. Admit to medicine for observation

## 2023-10-23 NOTE — ED Provider Notes (Signed)
 [facility]ERGENCY DEPARTMENT AT [facility] Provider Note   CSN: [id number] Arrival date & time: 10/23/23  9098     Patient presents with: [name] [name] Needs is a 68 y.o. male.   HPI   68 year old male presents to the emergency department with presumed fall.  Anticoagulated on Eliquis  for atrial fibrillation.  Patient states that he lives at home independently with his son visiting in the morning and at night.  Last night he remembers going to bed in his recliner which is normal for him.  This morning his son found him on the floor, surrounded by emesis and stool.  Patient does not know what happened, remembers waking up on the floor to his son.  Denies any acute complaints.  States he is otherwise been compliant with medications and without acute change in his health including fever, chest pain, abdominal pain, N/V/D.  Had a recent admission for failure to thrive, UTI.  Prior to Admission medications   Medication Sig Start Date End Date Taking? Authorizing Provider  acetaminophen  (TYLENOL ) 500 MG tablet Take 500 mg by mouth as needed for mild pain (pain score 1-3) or moderate pain (pain score 4-6).    [provider]  atorvastatin  (LIPITOR) 40 MG tablet Take 1 tablet (40 mg total) by mouth daily. 08/30/23   [name], [name], PA-C  Cholecalciferol  (VITAMIN D3) 50 MCG (2000 UT) capsule Take 2,000 Units by mouth daily.    [provider]  ELIQUIS  5 MG TABS tablet TAKE 1 TABLET BY MOUTH TWICE A DAY 10/14/23   Krasowski, Robert J, MD  Furosemide  (LASIX  PO) Take by mouth as needed.    [provider]  glipiZIDE  (GLUCOTROL ) 5 MG tablet Take 1 tablet (5 mg total) by mouth 2 (two) times daily before a meal. Patient taking differently: Take 2.5-5 mg by mouth 2 (two) times daily before a meal. 1qam ssqpm 08/22/23   Shamleffer, Donell Cardinal, MD  Loperamide  HCl (IMODIUM  PO) Take by mouth as needed.    [provider]  loratadine  (CLARITIN )  10 MG tablet Take 10 mg by mouth daily.    [provider]  losartan  (COZAAR ) 50 MG tablet Take 1 tablet (50 mg total) by mouth daily. 09/18/23 10/18/23  Akula, Vijaya, MD  mirtazapine  (REMERON ) 15 MG tablet TAKE 1 TABLET BY MOUTH EVERYDAY AT BEDTIME 04/23/23   [name], [name], PA-C  PARoxetine  (PAXIL ) 20 MG tablet Take 20 mg by mouth daily.    [provider]  QUEtiapine  (SEROQUEL ) 25 MG tablet Take 25 mg by mouth at bedtime. 09/16/23   [provider]  sitaGLIPtin  (JANUVIA ) 100 MG tablet Take 100 mg by mouth daily.    [provider]    Allergies: Other    Review of Systems  Constitutional:  Negative for fever.  HENT:  Negative for nosebleeds.   Eyes:  Negative for visual disturbance.  Respiratory:  Negative for shortness of breath.   Cardiovascular:  Negative for chest pain.  Gastrointestinal:  Negative for abdominal pain, diarrhea and vomiting.  Genitourinary:  Negative for dysuria.  Musculoskeletal:  Negative for back pain and neck pain.  Skin:  Negative for rash.  Neurological:  Negative for headaches.    Updated Vital Signs BP 130/73   Pulse 69   Temp 98.2 F (36.8 C) (Oral)   Resp (!) 23   SpO2 99%   Physical Exam Vitals and nursing note reviewed.  Constitutional:      General: He  is not in acute distress.    Appearance: Normal appearance.  HENT:     Head: Normocephalic.     Right Ear: External ear normal.     Left Ear: External ear normal.     Mouth/Throat:     Mouth: Mucous membranes are moist.  Eyes:     Extraocular Movements: Extraocular movements intact.     Conjunctiva/sclera: Conjunctivae normal.     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  Pulmonary:     Effort: Pulmonary effort is normal. No respiratory distress.  Abdominal:      Palpations: Abdomen is soft.     Tenderness: There is no abdominal tenderness.  Musculoskeletal:     Comments: Superficial abrasions, scattered mild ecchymosis in different stages  Skin:    General: Skin is warm.  Neurological:     Mental Status: He is alert and oriented to person, place, and time. Mental status is at baseline.  Psychiatric:        Mood and Affect: Mood normal.     (all labs ordered are listed, but only abnormal results are displayed) Labs Reviewed  CBC WITH DIFFERENTIAL/PLATELET - Abnormal; Notable for the following components:      Result Value   WBC 10.8 (*)    RBC 3.38 (*)    Hemoglobin 9.3 (*)    HCT 29.2 (*)    RDW 15.6 (*)    Neutro Abs 9.4 (*)    Lymphs Abs 0.6 (*)    All other components within normal limits  I-STAT CHEM 8, ED - Abnormal; Notable for the following components:   BUN 56 (*)    Glucose, Bld 279 (*)    Calcium , Ion 1.08 (*)    Hemoglobin 10.2 (*)    HCT 30.0 (*)    All other components within normal limits  PROTIME-INR  COMPREHENSIVE METABOLIC PANEL WITH GFR  MAGNESIUM   CK    EKG: None  Radiology: CT Head Wo Contrast Result Date: 10/23/2023 EXAM: CT HEAD WITHOUT CONTRAST 10/23/2023 09:48:42 AM TECHNIQUE: CT of the head was performed without the administration of intravenous contrast. Automated exposure control, iterative reconstruction, and/or weight based adjustment of the mA/kV was utilized to reduce the radiation dose to as low as reasonably achievable. COMPARISON: 09/12/2023 CLINICAL HISTORY: Polytrauma, blunt. Pt bib gcems from home, lives with son. Pt had fall last night around 2100. Was found on the floor this morning covered in feces and vomit. Alert to self only. Takes eliquis , no obvious head trauma or injuries. Abrasion and redness to right knee, redness to right chest FINDINGS: BRAIN AND VENTRICLES: . No evidence of acute infarct. No hydrocephalus. No mass effect or midline shift. Diffuse cerebral volume loss. Chronic  ischemic white matter disease. Acute subdural hematoma along left frontal convexity up to 4 mm in maximum thickness. Calcified atherosclerotic plaque within cavernous/supraclinoid internal carotid arteries. ORBITS: Bilateral lens replacement. SINUSES: No acute abnormality. SOFT TISSUES AND SKULL: No acute soft tissue abnormality. No skull fracture. Traumatic Brain Injury Risk Stratification ----- Skull Fracture: No (Low - mBIG 1) Subdural Hematoma (SDH): up to 4 mm in thickness (mBIG 2). Subarachnoid Hemorrhage [facility]): No Epidural Hematoma (EDH): No (Low - mBIG 1) Cerebral contusion, intra-axial, intraparenchymal Hemorrhage (IPH): No Intraventricular Hemorrhage (IVH): No (Low - mBIG 1) Midline Shift > 1mm  or Edema/effacement of sulci/vents: No (Low - mBIG 1) IMPRESSION: 1. Acute left frontal convexity subdural hematoma measuring up to 4 mm in thickness. No midline shift or significant intracranial mass effect. Electronically signed by: Helayne Hurst MD 10/23/2023 09:57 AM EDT RP Workstation: [id number]     .Critical Care  Performed by: Bari Roxie HERO, DO Authorized by: Bari Roxie HERO, DO   Critical care provider statement:    Critical care time (minutes):  30   Critical care time was exclusive of:  Separately billable procedures and treating other patients   Critical care was necessary to treat or prevent imminent or life-threatening deterioration of the following conditions:  CNS failure or compromise   Critical care was time spent personally by me on the following activities:  Development of treatment plan with patient or surrogate, discussions with consultants, evaluation of patient's response to treatment, examination of patient, ordering and review of laboratory studies, ordering and review of radiographic studies, ordering and performing treatments and interventions, pulse oximetry, re-evaluation of patient's condition and review of old charts   I assumed direction of critical care for this  patient from another provider in my specialty: no     Care discussed with: admitting provider      Medications Ordered in the ED - No data to display                                  Medical Decision Making Amount and/or Complexity of Data Reviewed Labs: ordered. Radiology: ordered.  Risk Prescription drug management. Decision regarding hospitalization.   68 year old male presents emergency department after being found on the ground this morning surrounded by emesis and stool.  He reportedly remembers going to sleep in his recliner and waking up on the floor to his son who checks on him in the morning and at night.  He states he has been using a rollator to get around at home.  He appears deteriorated, globally weak.  He states that he has otherwise been compliant with medications and denies any other acute change in his health.  He is alert and oriented x 3.  Blood work is baseline for the patient.  X-ray and CT imaging is significant for a small left-sided subdural hematoma, acute with no shift or complication.  Patient's last Eliquis  dose was last night.  Consulted with neurosurgery, Dr. Onetha.  He recommends repeat head CT in 6 hours, holding Eliquis  until these results.  Patient updated about these results.  Patient is high risk given the fact that he is anticoagulated on Eliquis .  He continues to be globally weak, cachectic appearing, deteriorated.  Will plan for medical admit and repeat head CT for further evaluation and care.  Patients evaluation and results requires admission for further treatment and care.  Spoke with hospitalist, reviewed patient's ED course and they accept admission.  Patient agrees with admission plan, offers no new complaints and is stable/unchanged at time of admit.       Final diagnoses:  None    ED Discharge Orders     None          Bari Roxie HERO, DO 10/23/23 1158

## 2023-10-23 NOTE — ED Notes (Signed)
 Pt had 2 large red/orange clay formed stools back to back, brief changed twice, male pure wick removed due to soiled, mep plex changed and replaced with clean. All linens changed and pt adjusted in bed

## 2023-10-23 NOTE — ED Notes (Signed)
 Pt soiled brief changed and complete linen change. Warm blankets provided, call bell in reach. Lights down. No other needs requested by pt.

## 2023-10-23 NOTE — Progress Notes (Signed)
 Patient ID: [name] [name], male   DOB: 1955-05-28, 68 y.o.   MRN: [id number] Repeat CT stable no neurosurgical recommendations hold Eliquis  for 72 hours and then restart as normal

## 2023-10-23 NOTE — ED Notes (Signed)
 Patient transported to CT

## 2023-10-24 ENCOUNTER — Encounter (HOSPITAL_COMMUNITY): Payer: Self-pay | Admitting: Hospitalist

## 2023-10-24 ENCOUNTER — Inpatient Hospital Stay (HOSPITAL_COMMUNITY)

## 2023-10-24 DIAGNOSIS — R55 Syncope and collapse: Secondary | ICD-10-CM

## 2023-10-24 DIAGNOSIS — S065XAA Traumatic subdural hemorrhage with loss of consciousness status unknown, initial encounter: Secondary | ICD-10-CM | POA: Diagnosis not present

## 2023-10-24 DIAGNOSIS — R569 Unspecified convulsions: Secondary | ICD-10-CM | POA: Diagnosis not present

## 2023-10-24 LAB — GLUCOSE, CAPILLARY
Glucose-Capillary: 177 mg/dL — ABNORMAL HIGH (ref 70–99)
Glucose-Capillary: 123 mg/dL — ABNORMAL HIGH (ref 70–99)
Glucose-Capillary: 98 mg/dL (ref 70–99)
Glucose-Capillary: 143 mg/dL — ABNORMAL HIGH (ref 70–99)

## 2023-10-24 LAB — BASIC METABOLIC PANEL WITH GFR
Anion gap: 10 (ref 5–15)
BUN: 47 mg/dL — ABNORMAL HIGH (ref 8–23)
CO2: 21 mmol/L — ABNORMAL LOW (ref 22–32)
Calcium: 8.4 mg/dL — ABNORMAL LOW (ref 8.9–10.3)
Chloride: 106 mmol/L (ref 98–111)
Creatinine, Ser: 1.06 mg/dL (ref 0.61–1.24)
GFR, Estimated: >60 mL/min (ref >60)
Glucose, Bld: 203 mg/dL — ABNORMAL HIGH (ref 70–99)
Potassium: 4.6 mmol/L (ref 3.5–5.1)
Sodium: 137 mmol/L (ref 135–145)

## 2023-10-24 LAB — ECHOCARDIOGRAM COMPLETE BUBBLE STUDY
Area-P 1/2: 3.89 cm2
Calc EF: 72.3 %
S' Lateral: 2.6 cm
Single Plane A2C EF: 69.8 %
Single Plane A4C EF: 74.7 %

## 2023-10-24 LAB — ETHANOL: Alcohol, Ethyl (B): <15 mg/dL (ref <15)

## 2023-10-24 MED ORDER — SODIUM CHLORIDE 0.9 % IV SOLN
1.0000 g | INTRAVENOUS | Status: AC
  Administered 2023-10-24 – 2023-10-28 (×5): 1 g via INTRAVENOUS
  Filled 2023-10-24 (×5): qty 10

## 2023-10-24 NOTE — Progress Notes (Signed)
 PROGRESS NOTE  [name] Mcnairy FMW:[id number] DOB: 1955/08/19 DOA: 10/23/2023 PCP: [name], Mardy HERO, PA-C   LOS: 1 day   Brief Narrative / Interim history: 68 year old male with history of PAF, chronic bilateral pleural effusions, diastolic CHF, CAD with history of CABG, PAD, DM2, chronic decubitus ulcer comes into the hospital with an episode of unresponsiveness, he was found down, found to have acute left subdural hematoma.  Patient has no recollection as to what happened, but tells me that over the last 3 to 4 days he has been feeling sick to his stomach, has been having nausea and vomiting and feels very weak.  Subjective / 24h Interval events: She is feeling much stronger this morning.  He denies any headaches, no chest pain, no shortness of breath.  Assesement and Plan: Principal problem Left SDH-4 mm, no midline shift on CT head.  Neurosurgery consulted, appreciate input, given stability there are no surgical recommendations at this point.  Neurosurgery recommends holding Eliquis  for 72 hours and restarted as normal afterwards.  Closely monitor  Active problems Possible UTI -patient without significant urinary complaints however did have nausea, vomiting, diarrhea and overall weakness and abdominal discomfort at home.  Favor treating given pyuria and bacteriuria  Syncope-EEG, echocardiogram pending  AKI-likely in the setting of poor p.o. intake, nausea, vomiting.  Creatinine stable  Essential hypertension-continue home losartan   Hyperlipidemia-continue statin  Scheduled Meds:  atorvastatin   40 mg Oral Daily   insulin  aspart  0-15 Units Subcutaneous TID WC   losartan   50 mg Oral Daily   PARoxetine   20 mg Oral Daily   Continuous Infusions:  sodium chloride  50 mL/hr at 10/23/23 1658   cefTRIAXone  (ROCEPHIN )  IV 1 g (10/24/23 0846)   PRN Meds:.  Current Outpatient Medications  Medication Instructions   acetaminophen  (TYLENOL ) 500 mg, As needed   atorvastatin   (LIPITOR) 40 mg, Oral, Daily   Eliquis  5 mg, Oral, 2 times daily   Furosemide  (LASIX  PO) As needed   glipiZIDE  (GLUCOTROL ) 5 mg, Oral, 2 times daily before meals   Loperamide  HCl (IMODIUM  PO) As needed   loratadine  (CLARITIN ) 10 mg, Daily   losartan  (COZAAR ) 50 mg, Oral, Daily   mirtazapine  (REMERON ) 15 MG tablet TAKE 1 TABLET BY MOUTH EVERYDAY AT BEDTIME   PARoxetine  (PAXIL ) 20 mg, Daily   QUEtiapine  (SEROQUEL ) 25 mg, Daily at bedtime   sitaGLIPtin  (JANUVIA ) 100 mg, Daily   Vitamin D3 2,000 Units, Daily    Diet Orders (From admission, onward)     Start     Ordered   10/23/23 1538  Diet Carb Modified Fluid consistency: Thin; Room service appropriate? Yes  Diet effective now       Question Answer Comment  Diet-HS Snack? Nothing   Calorie Level Medium 1600-2000   Fluid consistency: Thin   Room service appropriate? Yes      10/23/23 1537            DVT prophylaxis: SCDs Start: 10/23/23 1536   Lab Results  Component Value Date   PLT 239 10/23/2023      Code Status: Full Code  Family Communication: No family at bedside  Status is: Inpatient Remains inpatient appropriate because: Severity of illness   Level of care: Telemetry Medical  Consultants:  Neurosurgery  Objective: Vitals:   10/24/23 0142 10/24/23 0145 10/24/23 0311 10/24/23 0736  BP: (!) 168/82  (!) 160/86 (!) 158/89  Pulse: 72  66 (!) 59  Resp: 10  (!) 28 (!) 21  Temp: 98.3  F (36.8 C)  98.5 F (36.9 C) 98.5 F (36.9 C)  TempSrc: Oral  Oral Oral  SpO2:   96% 96%  Weight: 65.5 kg     Height:  6' 2 (1.88 m)      Intake/Output Summary (Last 24 hours) at 10/24/2023 0909 Last data filed at 10/24/2023 0348 Gross per 24 hour  Intake 541.67 ml  Output --  Net 541.67 ml   Wt Readings from Last 3 Encounters:  10/24/23 65.5 kg  09/30/23 66.2 kg  09/26/23 66.7 kg    Examination:  Constitutional: NAD Eyes: no scleral icterus ENMT: Mucous membranes are moist.  Neck: normal,  supple Respiratory: clear to auscultation bilaterally, no wheezing, no crackles.  Cardiovascular: Regular rate and rhythm, no murmurs / rubs / gallops.  Abdomen: non distended, no tenderness. Bowel sounds positive.  Musculoskeletal: no clubbing / cyanosis.    Data Reviewed: I have independently reviewed following labs and imaging studies   CBC Recent Labs  Lab 10/23/23 0933 10/23/23 0939 10/23/23 1011  WBC 10.8*  --   --   HGB 9.3* 10.2* 9.9*  HCT 29.2* 30.0* 29.0*  PLT 239  --   --   MCV 86.4  --   --   MCH 27.5  --   --   MCHC 31.8  --   --   RDW 15.6*  --   --   LYMPHSABS 0.6*  --   --   MONOABS 0.7  --   --   EOSABS 0.0  --   --   BASOSABS 0.0  --   --     Recent Labs  Lab 10/23/23 0933 10/23/23 0939 10/23/23 1011 10/24/23 0516  NA 132* 135 135 137  K 5.0 5.0 5.1 4.6  CL 103 102  --  106  CO2 19*  --   --  21*  GLUCOSE 277* 279*  --  203*  BUN 50* 56*  --  47*  CREATININE 1.19 1.10  --  1.06  CALCIUM  8.5*  --   --  8.4*  AST 22  --   --   --   ALT 16  --   --   --   ALKPHOS 179*  --   --   --   BILITOT 0.8  --   --   --   ALBUMIN 2.6*  --   --   --   MG 1.7  --   --   --   INR 1.8*  --   --   --     ------------------------------------------------------------------------------------------------------------------ No results for input(s): CHOL, HDL, LDLCALC, TRIG, CHOLHDL, LDLDIRECT in the last 72 hours.  Lab Results  Component Value Date   HGBA1C 9.1 (H) 07/26/2023   ------------------------------------------------------------------------------------------------------------------ No results for input(s): TSH, T4TOTAL, T3FREE, THYROIDAB in the last 72 hours.  Invalid input(s): FREET3  Cardiac Enzymes No results for input(s): CKMB, TROPONINI, MYOGLOBIN in the last 168 hours.  Invalid input(s): CK ------------------------------------------------------------------------------------------------------------------     Component Value Date/Time   BNP 184.1 (H) 09/12/2023 1200    CBG: Recent Labs  Lab 10/23/23 2201 10/24/23 0811  GLUCAP 146* 177*    Recent Results (from the past 240 hours)  Culture, blood (Routine X 2) w Reflex to ID Panel     Status: None (Preliminary result)   Collection Time: 10/23/23 11:20 PM   Specimen: BLOOD  Result Value Ref Range Status   Specimen Description BLOOD RIGHT ANTECUBITAL  Final   Special Requests  Final    BOTTLES DRAWN AEROBIC AND ANAEROBIC Blood Culture results may not be optimal due to an inadequate volume of blood received in culture bottles   Culture   Final    NO GROWTH < 12 HOURS Performed at [facility], [address], [place], [place] [address]    Report Status PENDING  Incomplete  Culture, blood (Routine X 2) w Reflex to ID Panel     Status: None (Preliminary result)   Collection Time: 10/23/23 11:44 PM   Specimen: BLOOD RIGHT ARM  Result Value Ref Range Status   Specimen Description BLOOD RIGHT ARM  Final   Special Requests   Final    BOTTLES DRAWN AEROBIC AND ANAEROBIC Blood Culture adequate volume   Culture   Final    NO GROWTH < 12 HOURS Performed at [facility], [address] [place], [place] [address]    Report Status PENDING  Incomplete     Radiology Studies: CT Head Wo Contrast Result Date: 10/23/2023 EXAM: CT HEAD WITHOUT CONTRAST 10/23/2023 04:11:01 PM TECHNIQUE: CT of the head was performed without the administration of intravenous contrast. Automated exposure control, iterative reconstruction, and/or weight based adjustment of the mA/kV was utilized to reduce the radiation dose to as low as reasonably achievable. COMPARISON: 10/23/2023 CLINICAL HISTORY: Polytrauma, blunt; follow-up subdural hematoma. FINDINGS: BRAIN AND VENTRICLES: Stable 4 mm left frontal convexity subdural hematoma. No mass effect or midline shift is present. Periventricular and subcortical white matter hypoattenuation is stable, mildly  advanced for age. Atherosclerotic calcifications in cavernous internal carotid arteries. No acute hemorrhage. No evidence of acute infarct. No hydrocephalus. ORBITS: Bilateral lens replacement noted. No acute abnormality. SINUSES: No acute abnormality. SOFT TISSUES AND SKULL: No acute soft tissue abnormality. No skull fracture. IMPRESSION: 1. Stable 4 mm left frontal convexity subdural hematoma without mass effect or midline shift. 2. No acute intracranial abnormality. Electronically signed by: Lonni Necessary MD 10/23/2023 04:25 PM EDT RP Workstation: [id number]   DG Chest Portable 1 View Result Date: 10/23/2023 EXAM: 1 VIEW XRAY OF THE CHEST 10/23/2023 10:01:00 AM COMPARISON: Chest radiograph dated 09/12/2023. CLINICAL HISTORY: Pt had fall last night around 2100. Was found on the floor this morning. FINDINGS: LUNGS AND PLEURA: Redemonstrated partially loculated small right pleural effusion and small left pleural effusion. No appreciable acute airspace consolidation. No pneumothorax. HEART AND MEDIASTINUM: Stable cardiomediastinal silhouette. Status post CABG in median sternotomy. Aortic atherosclerosis. BONES AND SOFT TISSUES: No acute osseous abnormality identified. IMPRESSION: 1. Partially loculated small right pleural effusion and small left pleural effusion, not significantly changed compared to the prior exam. No appreciable acute abnormality. Electronically signed by: Harrietta Sherry MD 10/23/2023 10:32 AM EDT RP Workstation: [id number]   DG Pelvis Portable Result Date: 10/23/2023 EXAM: 1 or 2 VIEW(S) XRAY OF THE PELVIS 10/23/2023 10:01:00 AM COMPARISON: 05/16/2020 CLINICAL HISTORY: Patient had fall last night around 2100. Was found on the floor this morning. FINDINGS: BONES AND JOINTS: Diffuse osseous demineralization. No acute fracture. No joint dislocation. Femoral heads are seated within the acetabula. Mild osteoarthritis of the bilateral hips. SOFT TISSUES: Aortoiliac atherosclerotic  calcification. IMPRESSION: 1. No acute osseous abnormality. Electronically signed by: Shahmeer Sherry MD 10/23/2023 10:26 AM EDT RP Workstation: [id number]   CT Cervical Spine Wo Contrast Result Date: 10/23/2023 EXAM: CT CERVICAL SPINE WITHOUT CONTRAST 10/23/2023 09:48:42 AM TECHNIQUE: CT of the cervical spine was performed without the administration of intravenous contrast. Multiplanar reformatted images are provided for review. Automated exposure control, iterative reconstruction, and/or weight-based  adjustment of the mA/kV was utilized to reduce the radiation dose to as low as reasonably achievable. COMPARISON: Cervical spine CT 05/16/2020. CLINICAL HISTORY: 57-Year-Old Male, Polytrauma. FINDINGS: CERVICAL SPINE: BONES AND ALIGNMENT: Stable cervical lordosis.  No acute findings. DEGENERATIVE CHANGES: Chronic upper cervical facet arthropathy on the left and lower cervical disc and endplate degeneration at C5-C6 and C6-C7. SOFT TISSUES: No prevertebral soft tissue swelling. IMPRESSION: 1. No acute traumatic injury identified in the cervical spine. Electronically signed by: Helayne Hurst MD 10/23/2023 10:03 AM EDT RP Workstation: [id number]   CT Head Wo Contrast Result Date: 10/23/2023 EXAM: CT HEAD WITHOUT CONTRAST 10/23/2023 09:48:42 AM TECHNIQUE: CT of the head was performed without the administration of intravenous contrast. Automated exposure control, iterative reconstruction, and/or weight based adjustment of the mA/kV was utilized to reduce the radiation dose to as low as reasonably achievable. COMPARISON: 09/12/2023 CLINICAL HISTORY: Polytrauma, blunt. Pt bib gcems from home, lives with son. Pt had fall last night around 2100. Was found on the floor this morning covered in feces and vomit. Alert to self only. Takes eliquis , no obvious head trauma or injuries. Abrasion and redness to right knee, redness to right chest FINDINGS: BRAIN AND VENTRICLES: . No evidence of acute infarct. No hydrocephalus. No  mass effect or midline shift. Diffuse cerebral volume loss. Chronic ischemic white matter disease. Acute subdural hematoma along left frontal convexity up to 4 mm in maximum thickness. Calcified atherosclerotic plaque within cavernous/supraclinoid internal carotid arteries. ORBITS: Bilateral lens replacement. SINUSES: No acute abnormality. SOFT TISSUES AND SKULL: No acute soft tissue abnormality. No skull fracture. Traumatic Brain Injury Risk Stratification ----- Skull Fracture: No (Low - mBIG 1) Subdural Hematoma (SDH): up to 4 mm in thickness (mBIG 2). Subarachnoid Hemorrhage [facility]): No Epidural Hematoma (EDH): No (Low - mBIG 1) Cerebral contusion, intra-axial, intraparenchymal Hemorrhage (IPH): No Intraventricular Hemorrhage (IVH): No (Low - mBIG 1) Midline Shift > 1mm or Edema/effacement of sulci/vents: No (Low - mBIG 1) IMPRESSION: 1. Acute left frontal convexity subdural hematoma measuring up to 4 mm in thickness. No midline shift or significant intracranial mass effect. Electronically signed by: Helayne Hurst MD 10/23/2023 09:57 AM EDT RP Workstation: [id number]   Nilda Fendt, MD, PhD [facility]  Between 7 am - 7 pm I am available, please contact me via Amion (for emergencies) or Securechat (non urgent messages)  Between 7 pm - 7 am I am not available, please contact night coverage MD/APP via Amion

## 2023-10-24 NOTE — TOC CAGE-AID Note (Signed)
 Transition of Care [name]) - CAGE-AID Screening   Patient Details  Name: [name] [name] MRN: [id number] Date of Birth: 12-16-1955  Transition of Care [facility]) CM/SW Contact:    Kaston Faughn E Milliani Herrada, LCSW Phone Number: 10/24/2023, 9:21 AM   Clinical Narrative: No SA noted.   CAGE-AID Screening:    Have You Ever Felt You Ought to Cut Down on Your Drinking or Drug Use?: No Have People Annoyed You By Critizing Your Drinking Or Drug Use?: No Have You Felt Bad Or Guilty About Your Drinking Or Drug Use?: No Have You Ever Had a Drink or Used Drugs First Thing In The Morning to Steady Your Nerves or to Get Rid of a Hangover?: No CAGE-AID Score: 0  Substance Abuse Education Offered: No

## 2023-10-24 NOTE — TOC Initial Note (Signed)
 Transition of Care [facility]) - Initial/Assessment Note    Patient Details  Name: [name] [name] MRN: [id number] Date of Birth: 03/14/1955  Transition of Care [facility]) CM/SW Contact:    [name], LCSW Phone Number: 10/24/2023, 4:30 PM  Clinical Narrative:                  CSW met with patient at bedside. CSW introduced self and explained role. CSW discussed with patient recommendation for short term rehab at [facility]. Patient reports he lives home alone. He states understanding and acknowledges the need for rehab. CSW explained the SNF process. No preferred SNF at this time.   TOC will provide bed offers once available.  Montie Louder, MSW, LCSW Clinical Social Worker    Expected Discharge Plan: Skilled Nursing Facility Barriers to Discharge: English as a second language teacher, Continued Medical Work up, SNF Pending bed offer   Patient Goals and CMS Choice            Expected Discharge Plan and Services In-house Referral: Clinical Social Work                                            Prior Living Arrangements/Services   Lives with:: Self Patient language and need for interpreter reviewed:: No        Need for Family Participation in Patient Care: Yes (Comment) Care giver support system in place?: Yes (comment)   Criminal Activity/Legal Involvement Pertinent to Current Situation/Hospitalization: No - Comment as needed  Activities of Daily Living   ADL Screening (condition at time of admission) Independently performs ADLs?: No Does the patient have a NEW difficulty with bathing/dressing/toileting/self-feeding that is expected to last >3 days?: Yes (Initiates electronic notice to provider for possible OT consult) Does the patient have a NEW difficulty with getting in/out of bed, walking, or climbing stairs that is expected to last >3 days?: Yes (Initiates electronic notice to provider for possible PT consult) Does the patient have a NEW difficulty with  communication that is expected to last >3 days?: No Is the patient deaf or have difficulty hearing?: Yes Does the patient have difficulty seeing, even when wearing glasses/contacts?: No Does the patient have difficulty concentrating, remembering, or making decisions?: Yes  Permission Sought/Granted Permission sought to share information with : Family Supports Permission granted to share information with : Yes, Verbal Permission Granted  Share Information with NAME: Claiborne Stroble  Permission granted to share info w AGENCY: SNFs  Permission granted to share info w Relationship: son  Permission granted to share info w Contact Information: [phone]  Emotional Assessment Appearance:: Appears stated age   Affect (typically observed): Accepting Orientation: : Oriented to Self, Oriented to Place, Oriented to  Time, Oriented to Situation   Psych Involvement: No (comment)  Admission diagnosis:  Subdural hematoma (HCC) [S06.5XAA] Patient Active Problem List   Diagnosis Date Noted   Subdural hematoma (HCC) 10/23/2023   AKI (acute kidney injury) 09/12/2023   Uremia 09/12/2023   Diabetes (HCC) 09/12/2023   Acute encephalopathy 09/12/2023   Urinary incontinence 08/30/2023   Hx of iron  deficiency anemia 08/22/2023   Sacral decubitus ulcer 08/18/2023   Bilateral pleural effusion 08/18/2023   GAD (generalized anxiety disorder) 08/18/2023   CHF (congestive heart failure) (HCC) 02/03/2023   History of CVA (cerebrovascular accident) 11/30/2022   Heart valve vegetation 11/23/2022   Noncompliance 11/20/2022   Requires continuous supervision  for activities of daily living (ADL) 11/20/2022   Pre-ulcerative calluses 03/05/2022   Protein-calorie malnutrition, severe 01/06/2022   Atrial fibrillation with slow ventricular response (HCC) 01/03/2022   Visual hallucinations 01/03/2022   Hematuria 01/03/2022   Postherpetic neuralgia 08/11/2021   Blood clotting disorder 07/12/2021   Type 1 diabetes  mellitus (HCC) 02/16/2021   Chronic diarrhea 02/16/2021   Sinus bradycardia 02/15/2021   Diabetic ulcer of left foot associated with type 1 diabetes mellitus (HCC) 06/30/2020   Pressure injury of skin 05/24/2020   BPH with obstruction/lower urinary tract symptoms 03/16/2020   Normocytic anemia 01/13/2020   Hyperlipidemia LDL goal <70 01/13/2020   Peripheral artery disease 01/13/2020   Coronary artery disease involving bypass graft of transplanted heart without angina pectoris 01/13/2020   Paroxysmal atrial fibrillation (HCC) 01/13/2020   Recurrent major depressive disorder, in partial remission 01/13/2020   Tear of lateral meniscus of knee 10/05/2019   Aortic dissection (HCC) 02/05/2019   Hypertension 02/05/2019   Psoriasis 02/05/2019   Vitamin D  deficiency 01/07/2019   Microalbuminuria 01/07/2019   Patellar tendonitis of right knee 04/24/2018   Peyronie's disease 06/09/2015   Erectile dysfunction 03/15/2015   S/P aortic aneurysm repair 05/19/2012   S/P CABG (coronary artery bypass graft) 05/19/2012   Diabetic retinopathy (HCC) 02/27/2011   Contracture of hand joint 11/01/2010   PCP:  [name] HERO, PA-C Pharmacy:   [facility] [phone] [address],  - [address] [place] [address] Phone: [phone] Fax: [phone]     Social Drivers of Health (SDOH) Social History: SDOH Screenings   Food Insecurity: No Food Insecurity (10/23/2023)  Recent Concern: Food Insecurity - Food Insecurity Present (08/18/2023)  Housing: Low Risk  (10/23/2023)  Transportation Needs: No Transportation Needs (10/23/2023)  Utilities: Not At Risk (10/23/2023)  Alcohol Screen: Low Risk  (08/19/2023)  Depression (PHQ2-9): Medium Risk (07/26/2023)  Financial Resource Strain: Low Risk  (08/19/2023)  Physical Activity: Inactive (11/21/2022)  Social Connections: Socially Isolated (10/23/2023)  Stress: Stress Concern Present (11/21/2022)  Tobacco Use: High Risk  (10/24/2023)  Health Literacy: Adequate Health Literacy (11/21/2022)   SDOH Interventions:     Readmission Risk Interventions    08/20/2023    4:20 PM  Readmission Risk Prevention Plan  Transportation Screening Complete  PCP or Specialist Appt within 3-5 Days Complete  HRI or Home Care Consult Complete  Palliative Care Screening Not Applicable  Medication Review (RN Care Manager) Complete

## 2023-10-24 NOTE — ED Notes (Addendum)
 Called floor to advise patient will be in route, Diplomatic Services operational officer receptive

## 2023-10-24 NOTE — Progress Notes (Signed)
 EEG complete - results pending

## 2023-10-24 NOTE — Progress Notes (Signed)
  Echocardiogram 2D Echocardiogram has been performed.  Norleen ORN Hart Haas 10/24/2023, 10:40 AM

## 2023-10-24 NOTE — Plan of Care (Signed)
  Problem: Coping: Goal: Ability to adjust to condition or change in health will improve Outcome: Progressing   Problem: Health Behavior/Discharge Planning: Goal: Ability to identify and utilize available resources and services will improve Outcome: Progressing Goal: Ability to manage health-related needs will improve Outcome: Progressing   

## 2023-10-24 NOTE — Evaluation (Addendum)
 Occupational Therapy Evaluation Patient Details Name: [name] [name] MRN: [id number] DOB: [birth year] Today's Date: 10/24/2023   History of Present Illness   Pt is a 68 y/o male presenting to [facility] on [facility] after fall.  Imaging found L SDH, 4 mm shift.  Potential UTI. EEG negative. PMH includes: PAF, chronic bil PE, CHF, CAD s/p CABG, PAD, DM2, chronic decubitus ulcer.     Clinical Impressions PTA patient independent with ADLs, mobility using rollator as needed. Admitted for above and presents with problem list below.  Patient currently requires min assist for bed mobility and min assist +2 for safety transfers using RW, setup to mod assist for Adls.  Pt reports dizziness with standing at EOB, and had to sit before BP was completed-- + orthostatic, see below for details and notified RN.  Session limited by orthostatic hypotension, as pt requested to return back to bed.  Cognitively, pt needing reorientation to time and requires frequent reminders to recall tasks.  Based on performance today, believe patient will best benefit from continued OT services acutely and after dc at inpatient setting with <3hrs/day to optimize independence, safety with ADLs and mobility.   Supine 154/107 (118)  HR 58 Sitting EOB 114/70 (83) HR 64 Standing, finished in sitting 96/76 (84) HR 77  Supine 168/86 (111) HR 63    If plan is discharge home, recommend the following:   A little help with bathing/dressing/bathroom;A lot of help with walking and/or transfers;Direct supervision/assist for financial management;Direct supervision/assist for medications management;Assistance with cooking/housework;Assist for transportation;Help with stairs or ramp for entrance     Functional Status Assessment   Patient has had a recent decline in their functional status and demonstrates the ability to make significant improvements in function in a reasonable and predictable amount of time.     Equipment Recommendations    BSC/3in1     Recommendations for Other Services         Precautions/Restrictions   Precautions Precautions: Fall Recall of Precautions/Restrictions: Impaired Precaution/Restrictions Comments: watch BP, orthostatic Restrictions Weight Bearing Restrictions Per Provider Order: No     Mobility Bed Mobility Overal bed mobility: Needs Assistance Bed Mobility: Supine to Sit, Sit to Supine     Supine to sit: Min assist, HOB elevated Sit to supine: Contact guard assist   General bed mobility comments: pt reaching for UE support to pull to sit    Transfers Overall transfer level: Needs assistance Equipment used: Rolling walker (2 wheels) Transfers: Sit to/from Stand Sit to Stand: Min assist           General transfer comment: +2 safety, min assist to steady      Balance Overall balance assessment: Needs assistance Sitting-balance support: No upper extremity supported, Feet supported Sitting balance-Leahy Scale: Good     Standing balance support: Single extremity supported, During functional activity, Reliant on assistive device for balance Standing balance-Leahy Scale: Fair                             ADL either performed or assessed with clinical judgement   ADL Overall ADL's : Needs assistance/impaired     Grooming: Set up;Sitting           Upper Body Dressing : Set up;Sitting   Lower Body Dressing: Minimal assistance;Sit to/from Market researcher Details (indicate cue type and reason): deferred         Functional mobility during ADLs: Minimal assistance;+2 for  safety/equipment;Rolling walker (2 wheels);Cueing for safety       Vision Patient Visual Report: Other (comment) (blind L eye baseline)       Perception         Praxis         Pertinent Vitals/Pain Pain Assessment Pain Assessment: No/denies pain     Extremity/Trunk Assessment Upper Extremity Assessment Upper Extremity Assessment: Generalized  weakness   Lower Extremity Assessment Lower Extremity Assessment: Defer to PT evaluation       Communication Communication Communication: Impaired Factors Affecting Communication: Hearing impaired   Cognition Arousal: Alert Behavior During Therapy: Flat affect Cognition: Cognition impaired   Orientation impairments: Time Awareness: Online awareness impaired Memory impairment (select all impairments): Short-term memory, Working memory Attention impairment (select first level of impairment): Sustained attention Executive functioning impairment (select all impairments): Problem solving OT - Cognition Comments: pt needing reorientation to time, following simple commands with increased time.  tends to be forgetful and requires repetition with tasks.                 Following commands: Impaired Following commands impaired: Follows one step commands with increased time     Cueing  General Comments   Cueing Techniques: Verbal cues  pt reports dizziness when standing, + orthostatics with SBP decreased from 154 to 96   Exercises     Shoulder Instructions      Home Living Family/patient expects to be discharged to:: Private residence Living Arrangements: Alone Available Help at Discharge: Family;Available PRN/intermittently Type of Home: Apartment Home Access: Level entry     Home Layout: One level     Bathroom Shower/Tub: Chief Strategy Officer: Standard     Home Equipment: Grab bars - toilet;Grab bars - tub/shower;Rollator (4 wheels);Wheelchair - Sport and exercise psychologist Comments: sister and son live close by      Prior Functioning/Environment Prior Level of Function : Needs assist             Mobility Comments: rollator for mobility as needed ADLs Comments: independent ADLs, IADLs- orders groceries, transportation through insurance, uses pill box    OT Problem List: Decreased strength;Decreased activity tolerance;Impaired balance  (sitting and/or standing);Decreased cognition;Decreased safety awareness;Decreased knowledge of use of DME or AE;Decreased knowledge of precautions;Cardiopulmonary status limiting activity   OT Treatment/Interventions: Self-care/ADL training;Therapeutic exercise;DME and/or AE instruction;Therapeutic activities;Patient/family education;Balance training;Cognitive remediation/compensation      OT Goals(Current goals can be found in the care plan section)   Acute Rehab OT Goals Patient Stated Goal: get better OT Goal Formulation: With patient Time For Goal Achievement: 11/07/23 Potential to Achieve Goals: Good   OT Frequency:  Min 2X/week    Co-evaluation PT/OT/SLP Co-Evaluation/Treatment: Yes Reason for Co-Treatment: For patient/therapist safety;To address functional/ADL transfers PT goals addressed during session: Mobility/safety with mobility OT goals addressed during session: ADL's and self-care      AM-PAC OT 6 Clicks Daily Activity     Outcome Measure Help from another person eating meals?: A Little Help from another person taking care of personal grooming?: A Little Help from another person toileting, which includes using toliet, bedpan, or urinal?: A Lot Help from another person bathing (including washing, rinsing, drying)?: A Little Help from another person to put on and taking off regular upper body clothing?: A Little Help from another person to put on and taking off regular lower body clothing?: A Little 6 Click Score: 17   End of Session Equipment Utilized During Treatment: Gait belt;Rolling walker (2 wheels)  Nurse Communication: Mobility status;Precautions;Other (comment) (BP)  Activity Tolerance: Patient tolerated treatment well Patient left: in bed;with call bell/phone within reach;with bed alarm set  OT Visit Diagnosis: Other abnormalities of gait and mobility (R26.89);Muscle weakness (generalized) (M62.81);Other symptoms and signs involving cognitive function                 Time: 8852-8788 OT Time Calculation (min): 24 min Charges:  OT General Charges $OT Visit: 1 Visit OT Evaluation $OT Eval Moderate Complexity: 1 Mod  Etta NOVAK, OT Acute Rehabilitation Services Office [phone] Secure Chat Preferred    Etta GORMAN Hope 10/24/2023, 1:49 PM

## 2023-10-24 NOTE — NC FL2 (Addendum)
 [place]  MEDICAID FL2 LEVEL OF CARE FORM     IDENTIFICATION  Patient Name: [name] [name] Birthdate: [birth year] Sex: male Admission Date (Current Location): 10/23/2023  [facility] and [place] Number:  Producer, television/film/video and Address:  [facility], [address] [place], [place] [address]      Provider Number: [id number]  Attending Physician Name and Address:  Trixie Nilda HERO, MD  Relative Name and Phone Number:       Current Level of Care: Hospital Recommended Level of Care: Skilled Nursing Facility Prior Approval Number:    Date Approved/Denied:   PASRR Number: [id number]  Discharge Plan: SNF    Current Diagnoses: Patient Active Problem List   Diagnosis Date Noted   Subdural hematoma (HCC) 10/23/2023   AKI (acute kidney injury) 09/12/2023   Uremia 09/12/2023   Diabetes (HCC) 09/12/2023   Acute encephalopathy 09/12/2023   Urinary incontinence 08/30/2023   Hx of iron  deficiency anemia 08/22/2023   Sacral decubitus ulcer 08/18/2023   Bilateral pleural effusion 08/18/2023   GAD (generalized anxiety disorder) 08/18/2023   CHF (congestive heart failure) (HCC) 02/03/2023   History of CVA (cerebrovascular accident) 11/30/2022   Heart valve vegetation 11/23/2022   Noncompliance 11/20/2022   Requires continuous supervision for activities of daily living (ADL) 11/20/2022   Pre-ulcerative calluses 03/05/2022   Protein-calorie malnutrition, severe 01/06/2022   Atrial fibrillation with slow ventricular response (HCC) 01/03/2022   Visual hallucinations 01/03/2022   Hematuria 01/03/2022   Postherpetic neuralgia 08/11/2021   Blood clotting disorder 07/12/2021   Type 1 diabetes mellitus (HCC) 02/16/2021   Chronic diarrhea 02/16/2021   Sinus bradycardia 02/15/2021   Diabetic ulcer of left foot associated with type 1 diabetes mellitus (HCC) 06/30/2020   Pressure injury of skin 05/24/2020   BPH with obstruction/lower urinary tract symptoms  03/16/2020   Normocytic anemia 01/13/2020   Hyperlipidemia LDL goal <70 01/13/2020   Peripheral artery disease 01/13/2020   Coronary artery disease involving bypass graft of transplanted heart without angina pectoris 01/13/2020   Paroxysmal atrial fibrillation (HCC) 01/13/2020   Recurrent major depressive disorder, in partial remission 01/13/2020   Tear of lateral meniscus of knee 10/05/2019   Aortic dissection (HCC) 02/05/2019   Hypertension 02/05/2019   Psoriasis 02/05/2019   Vitamin D  deficiency 01/07/2019   Microalbuminuria 01/07/2019   Patellar tendonitis of right knee 04/24/2018   Peyronie's disease 06/09/2015   Erectile dysfunction 03/15/2015   S/P aortic aneurysm repair 05/19/2012   S/P CABG (coronary artery bypass graft) 05/19/2012   Diabetic retinopathy (HCC) 02/27/2011   Contracture of hand joint 11/01/2010    Orientation RESPIRATION BLADDER Height & Weight     Self, Time, Situation, Place  Normal Incontinent Weight: 144 lb 6.4 oz (65.5 kg) Height:  6' 2 (188 cm)  BEHAVIORAL SYMPTOMS/MOOD NEUROLOGICAL BOWEL NUTRITION STATUS      Incontinent Diet (please see discharge summary)  AMBULATORY STATUS COMMUNICATION OF NEEDS Skin   Limited Assist Verbally Skin abrasions (abrasion left thigh , at risk for pressure injuy)                       Personal Care Assistance Level of Assistance  Bathing, Feeding, Dressing Bathing Assistance: Limited assistance Feeding assistance: Independent Dressing Assistance: Limited assistance     Functional Limitations Info  Sight, Hearing, Speech Sight Info: Adequate Hearing Info: Adequate Speech Info: Adequate    SPECIAL CARE FACTORS FREQUENCY  PT (By licensed PT), OT (By licensed OT)  PT Frequency: 5x per week OT Frequency: 5x per week            Contractures Contractures Info: Not present    Additional Factors Info  Code Status, Allergies Code Status Info: FULL Allergies Info: Wool fabric - hives, itching            Current Medications (10/24/2023):  This is the current hospital active medication list Current Facility-Administered Medications  Medication Dose Route Frequency Provider Last Rate Last Admin   atorvastatin  (LIPITOR) tablet 40 mg  40 mg Oral Daily Georgina Basket, MD   40 mg at 10/24/23 9161   cefTRIAXone  (ROCEPHIN ) 1 g in sodium chloride  0.9 % 100 mL IVPB  1 g Intravenous Q24H Gherghe, Costin M, MD 200 mL/hr at 10/24/23 0846 1 g at 10/24/23 0846   insulin  aspart (novoLOG ) injection 0-15 Units  0-15 Units Subcutaneous TID WC Georgina Basket, MD   2 Units at 10/24/23 1253   losartan  (COZAAR ) tablet 50 mg  50 mg Oral Daily Georgina Basket, MD   50 mg at 10/24/23 9161   PARoxetine  (PAXIL ) tablet 20 mg  20 mg Oral Daily Georgina Basket, MD   20 mg at 10/24/23 9161     Discharge Medications: Please see discharge summary for a list of discharge medications.  Relevant Imaging Results:  Relevant Lab Results:   Additional Information SSN [id number]  contact precautions MRSA  [name], LCSW

## 2023-10-24 NOTE — Evaluation (Addendum)
 Physical Therapy Evaluation Patient Details Name: [name] [name] MRN: [id number] DOB: [birth year] Today's Date: 10/24/2023  History of Present Illness  Pt is a 68 y/o male presenting to [facility] on 10/22 after fall.  Imaging found L SDH, 4 mm shift.  Potential UTI. EEG negative. PMH includes: PAF, chronic bil PE, CHF, CAD s/p CABG, PAD, DM2, chronic decubitus ulcer.  Clinical Impression  Pt admitted secondary to problem above with deficits below. PTA patient lives alone in one-level apartment. He denies falls, however was found on the floor by his son PTA. He uses rollator vs wheelchair intermittently.  Pt currently was orthostatic with attempts to mobilize (too symptomatic to even stand long enough for 1st standing BP to finish). Standing required +2 min assist with RW for safety. Based on pt's current functional status and available support; recommend inpatient therapies <3 hrs/day upon discharge from acute.  Anticipate patient will benefit from PT to address problems listed below. Will continue to follow acutely to maximize functional mobility, independence, and safety.       10/24/23 1300  Vital Signs  Patient Position (if appropriate) Orthostatic Vitals  Orthostatic Lying   BP- Lying (!) 154/107  Pulse- Lying 62  Orthostatic Sitting  BP- Sitting 114/70  Pulse- Sitting 65  Orthostatic Standing at 0 minutes  BP- Standing at 0 minutes 96/76  Pulse- Standing at 0 minutes 69  Orthostatic Standing at 3 minutes  BP- Standing at 3 minutes  (pt too dizzy and requested return to sitting)        If plan is discharge home, recommend the following: Two people to help with walking and/or transfers;Assistance with cooking/housework;Assist for transportation;Help with stairs or ramp for entrance   Can travel by private vehicle   No    Equipment Recommendations None recommended by PT  Recommendations for Other Services       Functional Status Assessment Patient has had a recent decline in  their functional status and demonstrates the ability to make significant improvements in function in a reasonable and predictable amount of time.     Precautions / Restrictions Precautions Precautions: Fall Recall of Precautions/Restrictions: Impaired Precaution/Restrictions Comments: watch BP, orthostatic Restrictions Weight Bearing Restrictions Per Provider Order: No      Mobility  Bed Mobility Overal bed mobility: Needs Assistance Bed Mobility: Supine to Sit, Sit to Supine     Supine to sit: Min assist, HOB elevated Sit to supine: Contact guard assist   General bed mobility comments: pt reaching for UE support to pull to sit; reports he sleeps in recliner at home    Transfers Overall transfer level: Needs assistance Equipment used: Rolling walker (2 wheels) Transfers: Sit to/from Stand Sit to Stand: Min assist           General transfer comment: +2 safety, min assist to steady; unable to stand for full standing BP due to dizziness    Ambulation/Gait               General Gait Details: unable due to dizziness  Stairs            Wheelchair Mobility     Tilt Bed    Modified Rankin (Stroke Patients Only)       Balance Overall balance assessment: Needs assistance Sitting-balance support: No upper extremity supported, Feet supported Sitting balance-Leahy Scale: Good     Standing balance support: Single extremity supported, During functional activity, Reliant on assistive device for balance Standing balance-Leahy Scale: Fair  Pertinent Vitals/Pain Pain Assessment Pain Assessment: No/denies pain    Home Living Family/patient expects to be discharged to:: Private residence Living Arrangements: Alone Available Help at Discharge: Family;Available PRN/intermittently Type of Home: Apartment Home Access: Level entry       Home Layout: One level Home Equipment: Grab bars - toilet;Grab bars -  tub/shower;Rollator (4 wheels);Wheelchair - Lawyer Comments: sister and son live close by    Prior Function Prior Level of Function : Needs assist       Physical Assist : ADLs (physical)     Mobility Comments: rollator for mobility as needed ADLs Comments: independent ADLs, IADLs- orders groceries, transportation through insurance, uses pill box     Extremity/Trunk Assessment   Upper Extremity Assessment Upper Extremity Assessment: Defer to OT evaluation    Lower Extremity Assessment Lower Extremity Assessment: Generalized weakness       Communication   Communication Communication: Impaired Factors Affecting Communication: Hearing impaired    Cognition Arousal: Alert Behavior During Therapy: Flat affect                             Following commands: Impaired Following commands impaired: Follows one step commands with increased time     Cueing Cueing Techniques: Verbal cues     General Comments General comments (skin integrity, edema, etc.): pt reports dizziness when standing, + orthostatics with SBP decreased from 154 to 96    Exercises     Assessment/Plan    PT Assessment Patient needs continued PT services  PT Problem List Decreased strength;Decreased activity tolerance;Decreased balance;Decreased mobility;Decreased knowledge of use of DME;Decreased knowledge of precautions;Cardiopulmonary status limiting activity       PT Treatment Interventions DME instruction;Gait training;Functional mobility training;Therapeutic activities;Therapeutic exercise;Balance training;Patient/family education    PT Goals (Current goals can be found in the Care Plan section)  Acute Rehab PT Goals Patient Stated Goal: none stated PT Goal Formulation: With patient Time For Goal Achievement: 11/07/23 Potential to Achieve Goals: Good    Frequency Min 2X/week     Co-evaluation PT/OT/SLP Co-Evaluation/Treatment: Yes Reason for  Co-Treatment: For patient/therapist safety;To address functional/ADL transfers PT goals addressed during session: Mobility/safety with mobility OT goals addressed during session: ADL's and self-care       AM-PAC PT 6 Clicks Mobility  Outcome Measure Help needed turning from your back to your side while in a flat bed without using bedrails?: A Little Help needed moving from lying on your back to sitting on the side of a flat bed without using bedrails?: A Little Help needed moving to and from a bed to a chair (including a wheelchair)?: Total Help needed standing up from a chair using your arms (e.g., wheelchair or bedside chair)?: Total Help needed to walk in hospital room?: Total Help needed climbing 3-5 steps with a railing? : Total 6 Click Score: 10    End of Session Equipment Utilized During Treatment: Gait belt Activity Tolerance: Treatment limited secondary to medical complications (Comment) (orthostasis; symptomatic) Patient left: in bed;with call bell/phone within reach;with bed alarm set (HOB elevated 40 degrees) Nurse Communication: Mobility status;Other (comment) (+orthostasis) PT Visit Diagnosis: Unsteadiness on feet (R26.81);Other abnormalities of gait and mobility (R26.89);Muscle weakness (generalized) (M62.81);History of falling (Z91.81);Dizziness and giddiness (R42)    Time: 8852-8789 PT Time Calculation (min) (ACUTE ONLY): 23 min   Charges:   PT Evaluation $PT Eval Low Complexity: 1 Low   PT General Charges $$ ACUTE PT VISIT: 1 Visit  Macario RAMAN, PT Acute Rehabilitation Services  Office [phone]   Macario SHAUNNA Soja 10/24/2023, 2:09 PM

## 2023-10-24 NOTE — Procedures (Signed)
 Patient Name: [name] [name]  MRN: [id number]  Epilepsy Attending: Arlin MALVA Krebs  Referring Physician/Provider: Georgina Basket, MD  Date: 10/24/2023 Duration: 22.21 mins  Patient history: 68yo M with SDH. EEG to evaluate for seizure  Level of alertness: Awake, asleep  AEDs during EEG study: None  Technical aspects: This EEG study was done with scalp electrodes positioned according to the 10-20 International system of electrode placement. Electrical activity was reviewed with band pass filter of 1-70Hz , sensitivity of 7 uV/mm, display speed of 81mm/sec with a 60Hz  notched filter applied as appropriate. EEG data were recorded continuously and digitally stored.  Video monitoring was available and reviewed as appropriate.  Description: The posterior dominant rhythm consists of 8 Hz activity of moderate voltage (25-35 uV) seen predominantly in posterior head regions, symmetric and reactive to eye opening and eye closing. Sleep was characterized by vertex waves, sleep spindles (12 to 14 Hz), maximal frontocentral region. There is continuous generalized 3 to 6 Hz theta-delta slowing. Hyperventilation and photic stimulation were not performed.     ABNORMALITY - Continuous slow, generalized  IMPRESSION: This study is suggestive of generalized cerebral dysfunction (encephalopathy). No seizures or epileptiform discharges were seen throughout the recording.  Pierce Biagini O Kanin Lia

## 2023-10-25 DIAGNOSIS — S065XAA Traumatic subdural hemorrhage with loss of consciousness status unknown, initial encounter: Secondary | ICD-10-CM | POA: Diagnosis not present

## 2023-10-25 LAB — GLUCOSE, CAPILLARY
Glucose-Capillary: 195 mg/dL — ABNORMAL HIGH (ref 70–99)
Glucose-Capillary: 184 mg/dL — ABNORMAL HIGH (ref 70–99)
Glucose-Capillary: 194 mg/dL — ABNORMAL HIGH (ref 70–99)
Glucose-Capillary: 171 mg/dL — ABNORMAL HIGH (ref 70–99)
Glucose-Capillary: 248 mg/dL — ABNORMAL HIGH (ref 70–99)

## 2023-10-25 MED ORDER — MIRTAZAPINE 15 MG PO TABS
15.0000 mg | ORAL_TABLET | Freq: Every day | ORAL | Status: DC
Start: 2023-10-25 — End: 2023-11-06
  Administered 2023-10-25 – 2023-11-05 (×12): 15 mg via ORAL
  Filled 2023-10-25 (×12): qty 1

## 2023-10-25 MED ORDER — ENSURE PLUS HIGH PROTEIN PO LIQD
237.0000 mL | Freq: Two times a day (BID) | ORAL | Status: DC
  Administered 2023-10-25 – 2023-11-06 (×23): 237 mL via ORAL

## 2023-10-25 MED ORDER — AMLODIPINE BESYLATE 10 MG PO TABS
10.0000 mg | ORAL_TABLET | Freq: Every day | ORAL | Status: DC
  Administered 2023-10-25 – 2023-11-06 (×13): 10 mg via ORAL
  Filled 2023-10-25 (×13): qty 1

## 2023-10-25 NOTE — Progress Notes (Signed)
 PROGRESS NOTE  [name] [name] FMW:[id number] DOB: 1955-07-07 DOA: 10/23/2023 PCP: [name] HERO, PA-C   LOS: 2 days   Brief Narrative / Interim history: 68 year old male with history of PAF, chronic bilateral pleural effusions, diastolic CHF, CAD with history of CABG, PAD, DM2, chronic decubitus ulcer comes into the hospital with an episode of unresponsiveness, he was found down, found to have acute left subdural hematoma.  Patient has no recollection as to what happened, but tells me that over the last 3 to 4 days he has been feeling sick to his stomach, has been having nausea and vomiting and feels very weak.  Subjective / 24h Interval events: Feeling overall well today.  Denies any headaches, no lightheadedness or dizziness.  Assesement and Plan: Principal problem Left SDH-4 mm, no midline shift on CT head.  Neurosurgery consulted, appreciate input, given stability there are no surgical recommendations at this point.  Neurosurgery recommends holding Eliquis  for 72 hours and restarted as normal afterwards.  Closely monitor, plan to start on Monday  Active problems Possible UTI -patient without significant urinary complaints however did have nausea, vomiting, diarrhea and overall weakness and abdominal discomfort at home.  Favor treating given pyuria and bacteriuria  Syncope-EEG negative for seizures, 2D echocardiogram done 10/23 shows LVEF 60-65%, no WMA, RV systolic function and size were normal  AKI-likely in the setting of poor p.o. intake, nausea, vomiting.  Creatinine stable  Essential hypertension-continue home losartan , add amlodipine  today due to elevated blood pressure  Hyperlipidemia-continue statin  Social-patient lives home alone, son is very concerned about his living situation and actually got APS involved.  Recommendations are in place now for SNF, patient appears to be agreeable, TOC consulted.  Scheduled Meds:  atorvastatin   40 mg Oral Daily   insulin   aspart  0-15 Units Subcutaneous TID WC   losartan   50 mg Oral Daily   PARoxetine   20 mg Oral Daily   Continuous Infusions:  cefTRIAXone  (ROCEPHIN )  IV 1 g (10/25/23 0839)   PRN Meds:.  Current Outpatient Medications  Medication Instructions   acetaminophen  (TYLENOL ) 500 mg, As needed   atorvastatin  (LIPITOR) 40 mg, Oral, Daily   Eliquis  5 mg, Oral, 2 times daily   furosemide  (LASIX ) 20 MG tablet 20-40 tablets, Daily PRN   glipiZIDE  (GLUCOTROL ) 5 mg, Oral, 2 times daily before meals   Loperamide  HCl (IMODIUM  PO) 4 mg, Oral, Every 4 hours PRN   loratadine  (CLARITIN ) 10 mg, Daily PRN   losartan  (COZAAR ) 50 mg, Oral, Daily   mirtazapine  (REMERON ) 15 MG tablet TAKE 1 TABLET BY MOUTH EVERYDAY AT BEDTIME   OVER THE COUNTER MEDICATION 500 mg, Oral, Daily, Opitmal Carnivore Organ Complex    PARoxetine  (PAXIL ) 20 mg, Oral, Daily   QUEtiapine  (SEROQUEL ) 25 mg, Oral, Daily at bedtime   sitaGLIPtin  (JANUVIA ) 100 mg, Oral, Daily   spironolactone  (ALDACTONE ) 25 mg, Oral, Daily   Vitamin D3 4,000 Units, Daily    Diet Orders (From admission, onward)     Start     Ordered   10/23/23 1538  Diet Carb Modified Fluid consistency: Thin; Room service appropriate? Yes  Diet effective now       Question Answer Comment  Diet-HS Snack? Nothing   Calorie Level Medium 1600-2000   Fluid consistency: Thin   Room service appropriate? Yes      10/23/23 1537            DVT prophylaxis: SCDs Start: 10/23/23 1536   Lab Results  Component Value Date  PLT 239 10/23/2023      Code Status: Full Code  Family Communication: No family at bedside  Status is: Inpatient Remains inpatient appropriate because: Severity of illness   Level of care: Telemetry Medical  Consultants:  Neurosurgery  Objective: Vitals:   10/24/23 1942 10/24/23 2300 10/25/23 0315 10/25/23 0800  BP: 132/73 (!) 159/80 (!) 165/80 (!) 176/85  Pulse: (!) 51 62 (!) 51 (!) 57  Resp: 16 16 12 18   Temp: 98.4 F (36.9 C)  98.6 F (37 C) 98.3 F (36.8 C) 98.3 F (36.8 C)  TempSrc: Oral Oral Oral Oral  SpO2: 95% 97% 94% 98%  Weight:      Height:        Intake/Output Summary (Last 24 hours) at 10/25/2023 1126 Last data filed at 10/25/2023 0450 Gross per 24 hour  Intake 558.33 ml  Output 875 ml  Net -316.67 ml   Wt Readings from Last 3 Encounters:  10/24/23 65.5 kg  09/30/23 66.2 kg  09/26/23 66.7 kg    Examination:  Constitutional: NAD Eyes: lids and conjunctivae normal, no scleral icterus ENMT: mmm Neck: normal, supple Respiratory: clear to auscultation bilaterally, no wheezing, no crackles. Cardiovascular: Regular rate and rhythm, no murmurs / rubs / gallops. No LE edema. Abdomen: soft, no distention, no tenderness. Bowel sounds positive.   Data Reviewed: I have independently reviewed following labs and imaging studies   CBC Recent Labs  Lab 10/23/23 0933 10/23/23 0939 10/23/23 1011  WBC 10.8*  --   --   HGB 9.3* 10.2* 9.9*  HCT 29.2* 30.0* 29.0*  PLT 239  --   --   MCV 86.4  --   --   MCH 27.5  --   --   MCHC 31.8  --   --   RDW 15.6*  --   --   LYMPHSABS 0.6*  --   --   MONOABS 0.7  --   --   EOSABS 0.0  --   --   BASOSABS 0.0  --   --     Recent Labs  Lab 10/23/23 0933 10/23/23 0939 10/23/23 1011 10/24/23 0516  NA 132* 135 135 137  K 5.0 5.0 5.1 4.6  CL 103 102  --  106  CO2 19*  --   --  21*  GLUCOSE 277* 279*  --  203*  BUN 50* 56*  --  47*  CREATININE 1.19 1.10  --  1.06  CALCIUM  8.5*  --   --  8.4*  AST 22  --   --   --   ALT 16  --   --   --   ALKPHOS 179*  --   --   --   BILITOT 0.8  --   --   --   ALBUMIN 2.6*  --   --   --   MG 1.7  --   --   --   INR 1.8*  --   --   --     ------------------------------------------------------------------------------------------------------------------ No results for input(s): CHOL, HDL, LDLCALC, TRIG, CHOLHDL, LDLDIRECT in the last 72 hours.  Lab Results  Component Value Date   HGBA1C 9.1 (H)  07/26/2023   ------------------------------------------------------------------------------------------------------------------ No results for input(s): TSH, T4TOTAL, T3FREE, THYROIDAB in the last 72 hours.  Invalid input(s): FREET3  Cardiac Enzymes No results for input(s): CKMB, TROPONINI, MYOGLOBIN in the last 168 hours.  Invalid input(s): CK ------------------------------------------------------------------------------------------------------------------    Component Value Date/Time   BNP 184.1 (H) 09/12/2023  1200    CBG: Recent Labs  Lab 10/24/23 0811 10/24/23 1145 10/24/23 1713 10/24/23 2136 10/25/23 0809  GLUCAP 177* 123* 98 143* 184*    Recent Results (from the past 240 hours)  Culture, blood (Routine X 2) w Reflex to ID Panel     Status: None (Preliminary result)   Collection Time: 10/23/23 11:20 PM   Specimen: BLOOD  Result Value Ref Range Status   Specimen Description BLOOD RIGHT ANTECUBITAL  Final   Special Requests   Final    BOTTLES DRAWN AEROBIC AND ANAEROBIC Blood Culture results may not be optimal due to an inadequate volume of blood received in culture bottles   Culture   Final    NO GROWTH 1 DAY Performed at [facility], [address] [place], [place] [address]    Report Status PENDING  Incomplete  Culture, blood (Routine X 2) w Reflex to ID Panel     Status: None (Preliminary result)   Collection Time: 10/23/23 11:44 PM   Specimen: BLOOD RIGHT ARM  Result Value Ref Range Status   Specimen Description BLOOD RIGHT ARM  Final   Special Requests   Final    BOTTLES DRAWN AEROBIC AND ANAEROBIC Blood Culture adequate volume   Culture   Final    NO GROWTH 1 DAY Performed at [facility], [address] [place], [place] [address]    Report Status PENDING  Incomplete     Radiology Studies: No results found.  Nilda Fendt, MD, PhD [facility]  Between 7 am - 7 pm I am available, please contact me via Amion  (for emergencies) or Securechat (non urgent messages)  Between 7 pm - 7 am I am not available, please contact night coverage MD/APP via Amion

## 2023-10-25 NOTE — Plan of Care (Signed)

## 2023-10-25 NOTE — TOC Progression Note (Signed)
 Transition of Care [facility]) - Progression Note    Patient Details  Name: [name] [name] MRN: [id number] Date of Birth: Sep 29, 1955  [facility]) CM/SW Contact  [name], [place] Phone Number: 10/25/2023, 12:30 PM  Clinical Narrative:     CSW met with patient at bedside- verbally informed of rehab bed offers and medicare.gov ratings. Patient wants [facility].  Sent message to [facility] to confirmed bed offer- SNF can admit once authorization has been approved.  Called [employer] insurance/[name]- left voice message to return call.   [employer] [employer] [place] [phone]- she was visiting with patient- CSW updated on placement and provided contact information.   Montie Louder, MSW, LCSW Clinical Social Worker    Expected Discharge Plan: Skilled Nursing Facility Barriers to Discharge: English as a second language teacher, Continued Medical Work up, SNF Pending bed offer               Expected Discharge Plan and Services In-house Referral: Clinical Social Work                                             Social Drivers of Health (SDOH) Interventions SDOH Screenings   Food Insecurity: No Food Insecurity (10/23/2023)  Recent Concern: Food Insecurity - Food Insecurity Present (08/18/2023)  Housing: Low Risk  (10/23/2023)  Transportation Needs: No Transportation Needs (10/23/2023)  Utilities: Not At Risk (10/23/2023)  Alcohol Screen: Low Risk  (08/19/2023)  Depression (PHQ2-9): Medium Risk (07/26/2023)  Financial Resource Strain: Low Risk  (08/19/2023)  Physical Activity: Inactive (11/21/2022)  Social Connections: Socially Isolated (10/23/2023)  Stress: Stress Concern Present (11/21/2022)  Tobacco Use: High Risk (10/24/2023)  Health Literacy: Adequate Health Literacy (11/21/2022)    Readmission Risk Interventions    08/20/2023    4:20 PM  Readmission Risk Prevention Plan  Transportation Screening Complete  PCP  or Specialist Appt within 3-5 Days Complete  HRI or Home Care Consult Complete  Palliative Care Screening Not Applicable  Medication Review (RN Care Manager) Complete

## 2023-10-25 NOTE — TOC Progression Note (Signed)
 Transition of Care [facility]) - Progression Note    Patient Details  Name: [name] [name] MRN: [id number] Date of Birth: 08-14-1955  Transition of Care [facility]) CM/SW Contact  [name], [place] Phone Number: 10/25/2023, 1:03 PM  Clinical Narrative:     Spoke with HTA/[name]- she will start insurance authorization for [employer] and [address]  Montie Louder, MSW, LCSW Clinical Social Worker    Expected Discharge Plan: Skilled Nursing Facility Barriers to Discharge: English as a second language teacher, Continued Medical Work up, SNF Pending bed offer               Expected Discharge Plan and Services In-house Referral: Clinical Social Work                                             Social Drivers of Health (SDOH) Interventions SDOH Screenings   Food Insecurity: No Food Insecurity (10/23/2023)  Recent Concern: Food Insecurity - Food Insecurity Present (08/18/2023)  Housing: Low Risk  (10/23/2023)  Transportation Needs: No Transportation Needs (10/23/2023)  Utilities: Not At Risk (10/23/2023)  Alcohol Screen: Low Risk  (08/19/2023)  Depression (PHQ2-9): Medium Risk (07/26/2023)  Financial Resource Strain: Low Risk  (08/19/2023)  Physical Activity: Inactive (11/21/2022)  Social Connections: Socially Isolated (10/23/2023)  Stress: Stress Concern Present (11/21/2022)  Tobacco Use: High Risk (10/24/2023)  Health Literacy: Adequate Health Literacy (11/21/2022)    Readmission Risk Interventions    08/20/2023    4:20 PM  Readmission Risk Prevention Plan  Transportation Screening Complete  PCP or Specialist Appt within 3-5 Days Complete  HRI or Home Care Consult Complete  Palliative Care Screening Not Applicable  Medication Review (RN Care Manager) Complete

## 2023-10-26 DIAGNOSIS — S065XAA Traumatic subdural hemorrhage with loss of consciousness status unknown, initial encounter: Secondary | ICD-10-CM | POA: Diagnosis not present

## 2023-10-26 LAB — BLOOD CULTURE ID PANEL (REFLEXED) - BCID2

## 2023-10-26 LAB — GLUCOSE, CAPILLARY
Glucose-Capillary: 237 mg/dL — ABNORMAL HIGH (ref 70–99)
Glucose-Capillary: 239 mg/dL — ABNORMAL HIGH (ref 70–99)
Glucose-Capillary: 121 mg/dL — ABNORMAL HIGH (ref 70–99)
Glucose-Capillary: 119 mg/dL — ABNORMAL HIGH (ref 70–99)

## 2023-10-26 NOTE — Plan of Care (Signed)

## 2023-10-26 NOTE — Progress Notes (Signed)
 PROGRESS NOTE  Salil Raineri FMW:[id number] DOB: 07/07/1955 DOA: 10/23/2023 PCP: [name], Mardy HERO, PA-C   LOS: 3 days   Brief Narrative / Interim history: 68 year old male with history of PAF, chronic bilateral pleural effusions, diastolic CHF, CAD with history of CABG, PAD, DM2, chronic decubitus ulcer comes into the hospital with an episode of unresponsiveness, he was found down, found to have acute left subdural hematoma.  Patient has no recollection as to what happened, but tells me that over the last 3 to 4 days he has been feeling sick to his stomach, has been having nausea and vomiting and feels very weak.  Subjective / 24h Interval events: No complaints today, he is now refusing to go to SNF and wants to be discharged home  Assesement and Plan: Principal problem Left SDH-4 mm, no midline shift on CT head.  Neurosurgery consulted, appreciate input, given stability there are no surgical recommendations at this point.  Neurosurgery recommends holding Eliquis  for 72 hours and restarted as normal afterwards.  Closely monitor, plan to start on Monday  Active problems Possible UTI -patient without significant urinary complaints however did have nausea, vomiting, diarrhea and overall weakness and abdominal discomfort at home.  Favor treating given pyuria and bacteriuria, plan for total of 5 days of antibiotics  Syncope-EEG negative for seizures, 2D echocardiogram done 10/23 shows LVEF 60-65%, no WMA, RV systolic function and size were normal  AKI-likely in the setting of poor p.o. intake, nausea, vomiting.  Creatinine stable  Essential hypertension-continue home losartan , add amlodipine  today due to elevated blood pressure  Hyperlipidemia-continue statin  Social-patient lives home alone, son is very concerned about his living situation and actually got APS involved.  SNF was recommended, patient was agreeable initially however this morning he does not wish to go to SNF and wants  to go home.  I had a long discussion with the patient as well as the patient's son over the phone, it appears that currently he does have capacity to make medical decisions.  I detailed to him that if he is at home, and falls again, this may have catastrophic consequences if he is unable to reach the phone or nobody is checking on him.  The son tells me that this has been an ongoing issue and he is struggling with his dad, as the patient has been refusing care even when this has been offered.  He feels like home avenue is not a really good option moving forward as he will be alone and unable to care for himself.  Scheduled Meds:  amLODipine   10 mg Oral Daily   atorvastatin   40 mg Oral Daily   feeding supplement  237 mL Oral BID BM   insulin  aspart  0-15 Units Subcutaneous TID WC   losartan   50 mg Oral Daily   mirtazapine   15 mg Oral QHS   PARoxetine   20 mg Oral Daily   Continuous Infusions:  cefTRIAXone  (ROCEPHIN )  IV 1 g (10/26/23 0846)   PRN Meds:.  Current Outpatient Medications  Medication Instructions   acetaminophen  (TYLENOL ) 500 mg, As needed   atorvastatin  (LIPITOR) 40 mg, Oral, Daily   Eliquis  5 mg, Oral, 2 times daily   furosemide  (LASIX ) 20 MG tablet 20-40 tablets, Daily PRN   glipiZIDE  (GLUCOTROL ) 5 mg, Oral, 2 times daily before meals   Loperamide  HCl (IMODIUM  PO) 4 mg, Oral, Every 4 hours PRN   loratadine  (CLARITIN ) 10 mg, Daily PRN   losartan  (COZAAR ) 50 mg, Oral, Daily  mirtazapine  (REMERON ) 15 MG tablet TAKE 1 TABLET BY MOUTH EVERYDAY AT BEDTIME   OVER THE COUNTER MEDICATION 500 mg, Oral, Daily, Opitmal Carnivore Organ Complex    PARoxetine  (PAXIL ) 20 mg, Oral, Daily   QUEtiapine  (SEROQUEL ) 25 mg, Oral, Daily at bedtime   sitaGLIPtin  (JANUVIA ) 100 mg, Oral, Daily   spironolactone  (ALDACTONE ) 25 mg, Oral, Daily   Vitamin D3 4,000 Units, Daily    Diet Orders (From admission, onward)     Start     Ordered   10/23/23 1538  Diet Carb Modified Fluid consistency:  Thin; Room service appropriate? Yes  Diet effective now       Question Answer Comment  Diet-HS Snack? Nothing   Calorie Level Medium 1600-2000   Fluid consistency: Thin   Room service appropriate? Yes      10/23/23 1537            DVT prophylaxis: SCDs Start: 10/23/23 1536   Lab Results  Component Value Date   PLT 239 10/23/2023      Code Status: Full Code  Family Communication: No family at bedside  Status is: Inpatient Remains inpatient appropriate because: Severity of illness   Level of care: Telemetry Medical  Consultants:  Neurosurgery  Objective: Vitals:   10/25/23 2320 10/26/23 0313 10/26/23 0754 10/26/23 1139  BP: 99/63 119/75 (!) 152/84 116/74  Pulse: (!) 56 (!) 55 60 (!) 55  Resp: 19 17 (!) 26 (!) 25  Temp: 98.9 F (37.2 C) 98.5 F (36.9 C) 98.5 F (36.9 C) 98.4 F (36.9 C)  TempSrc: Oral Oral Oral Oral  SpO2: 96% 95% 97% 97%  Weight:      Height:        Intake/Output Summary (Last 24 hours) at 10/26/2023 1307 Last data filed at 10/25/2023 1745 Gross per 24 hour  Intake 480 ml  Output 650 ml  Net -170 ml   Wt Readings from Last 3 Encounters:  10/24/23 65.5 kg  09/30/23 66.2 kg  09/26/23 66.7 kg    Examination:  Constitutional: NAD Eyes: lids and conjunctivae normal, no scleral icterus ENMT: mmm Neck: normal, supple Respiratory: clear to auscultation bilaterally, no wheezing, no crackles.  Cardiovascular: Regular rate and rhythm, no murmurs / rubs / gallops. No LE edema. Abdomen: soft, no distention, no tenderness. Bowel sounds positive.   Data Reviewed: I have independently reviewed following labs and imaging studies   CBC Recent Labs  Lab 10/23/23 0933 10/23/23 0939 10/23/23 1011  WBC 10.8*  --   --   HGB 9.3* 10.2* 9.9*  HCT 29.2* 30.0* 29.0*  PLT 239  --   --   MCV 86.4  --   --   MCH 27.5  --   --   MCHC 31.8  --   --   RDW 15.6*  --   --   LYMPHSABS 0.6*  --   --   MONOABS 0.7  --   --   EOSABS 0.0  --   --    BASOSABS 0.0  --   --     Recent Labs  Lab 10/23/23 0933 10/23/23 0939 10/23/23 1011 10/24/23 0516  NA 132* 135 135 137  K 5.0 5.0 5.1 4.6  CL 103 102  --  106  CO2 19*  --   --  21*  GLUCOSE 277* 279*  --  203*  BUN 50* 56*  --  47*  CREATININE 1.19 1.10  --  1.06  CALCIUM  8.5*  --   --  8.4*  AST 22  --   --   --   ALT 16  --   --   --   ALKPHOS 179*  --   --   --   BILITOT 0.8  --   --   --   ALBUMIN 2.6*  --   --   --   MG 1.7  --   --   --   INR 1.8*  --   --   --     ------------------------------------------------------------------------------------------------------------------ No results for input(s): CHOL, HDL, LDLCALC, TRIG, CHOLHDL, LDLDIRECT in the last 72 hours.  Lab Results  Component Value Date   HGBA1C 9.1 (H) 07/26/2023   ------------------------------------------------------------------------------------------------------------------ No results for input(s): TSH, T4TOTAL, T3FREE, THYROIDAB in the last 72 hours.  Invalid input(s): FREET3  Cardiac Enzymes No results for input(s): CKMB, TROPONINI, MYOGLOBIN in the last 168 hours.  Invalid input(s): CK ------------------------------------------------------------------------------------------------------------------    Component Value Date/Time   BNP 184.1 (H) 09/12/2023 1200    CBG: Recent Labs  Lab 10/25/23 1154 10/25/23 1628 10/25/23 2126 10/26/23 0804 10/26/23 1142  GLUCAP 194* 171* 248* 237* 239*    Recent Results (from the past 240 hours)  Culture, blood (Routine X 2) w Reflex to ID Panel     Status: None (Preliminary result)   Collection Time: 10/23/23 11:20 PM   Specimen: BLOOD  Result Value Ref Range Status   Specimen Description BLOOD RIGHT ANTECUBITAL  Final   Special Requests   Final    BOTTLES DRAWN AEROBIC AND ANAEROBIC Blood Culture results may not be optimal due to an inadequate volume of blood received in culture bottles   Culture  Setup  Time   Final    GRAM POSITIVE COCCI AEROBIC BOTTLE ONLY CRITICAL RESULT CALLED TO, READ BACK BY AND VERIFIED WITH: MAYA JINNIE DOOR [id number] AT 0837 BY EC Performed at [facility], [address] [place], [place] [address]    Culture GRAM POSITIVE COCCI  Final   Report Status PENDING  Incomplete  Blood Culture ID Panel (Reflexed)     Status: Abnormal   Collection Time: 10/23/23 11:20 PM  Result Value Ref Range Status   Enterococcus faecalis NOT DETECTED NOT DETECTED Final   Enterococcus Faecium NOT DETECTED NOT DETECTED Final   Listeria monocytogenes NOT DETECTED NOT DETECTED Final   Staphylococcus species DETECTED (A) NOT DETECTED Final    Comment: CRITICAL RESULT CALLED TO, READ BACK BY AND VERIFIED WITH: PHARMD LYDIA CHEN [id number] AT 0837 BY EC    Staphylococcus aureus (BCID) NOT DETECTED NOT DETECTED Final   Staphylococcus epidermidis NOT DETECTED NOT DETECTED Final   Staphylococcus lugdunensis NOT DETECTED NOT DETECTED Final   Streptococcus species NOT DETECTED NOT DETECTED Final   Streptococcus agalactiae NOT DETECTED NOT DETECTED Final   Streptococcus pneumoniae NOT DETECTED NOT DETECTED Final   Streptococcus pyogenes NOT DETECTED NOT DETECTED Final   A.calcoaceticus-baumannii NOT DETECTED NOT DETECTED Final   Bacteroides fragilis NOT DETECTED NOT DETECTED Final   Enterobacterales NOT DETECTED NOT DETECTED Final   Enterobacter cloacae complex NOT DETECTED NOT DETECTED Final   Escherichia coli NOT DETECTED NOT DETECTED Final   Klebsiella aerogenes NOT DETECTED NOT DETECTED Final   Klebsiella oxytoca NOT DETECTED NOT DETECTED Final   Klebsiella pneumoniae NOT DETECTED NOT DETECTED Final   Proteus species NOT DETECTED NOT DETECTED Final   Salmonella species NOT DETECTED NOT DETECTED Final   Serratia marcescens NOT DETECTED NOT DETECTED Final   Haemophilus influenzae NOT  DETECTED NOT DETECTED Final   Neisseria meningitidis NOT DETECTED NOT DETECTED Final    Pseudomonas aeruginosa NOT DETECTED NOT DETECTED Final   Stenotrophomonas maltophilia NOT DETECTED NOT DETECTED Final   Candida albicans NOT DETECTED NOT DETECTED Final   Candida auris NOT DETECTED NOT DETECTED Final   Candida glabrata NOT DETECTED NOT DETECTED Final   Candida krusei NOT DETECTED NOT DETECTED Final   Candida parapsilosis NOT DETECTED NOT DETECTED Final   Candida tropicalis NOT DETECTED NOT DETECTED Final   Cryptococcus neoformans/gattii NOT DETECTED NOT DETECTED Final    Comment: Performed at [facility], [address], [place], [place] [address]  Culture, blood (Routine X 2) w Reflex to ID Panel     Status: None (Preliminary result)   Collection Time: 10/23/23 11:44 PM   Specimen: BLOOD RIGHT ARM  Result Value Ref Range Status   Specimen Description BLOOD RIGHT ARM  Final   Special Requests   Final    BOTTLES DRAWN AEROBIC AND ANAEROBIC Blood Culture adequate volume   Culture   Final    NO GROWTH 2 DAYS Performed at [facility], [address], [place], [place] [address]    Report Status PENDING  Incomplete     Radiology Studies: No results found.  Nilda Fendt, MD, PhD [facility]  Between 7 am - 7 pm I am available, please contact me via Amion (for emergencies) or Securechat (non urgent messages)  Between 7 pm - 7 am I am not available, please contact night coverage MD/APP via Amion

## 2023-10-26 NOTE — TOC Progression Note (Signed)
 Transition of Care [facility]) - Progression Note    Patient Details  Name: [name] [name] MRN: [id number] Date of Birth: 11/29/1955  Transition [facility]) CM/SW Contact  Gwenn Frieze [name], [place] Phone Number: 10/26/2023, 8:55 AM  Clinical Narrative: Spoke to [name] with [employer] who reports PTAR has been approved [id number]) but SNF has been denied pending peer-to-peer review. Updated MD who spoke with pt and he is planning to return home at dc, now refusing SNF. Updated [name] with [employer]. CM to assist with dc planning for home.   Frieze Gwenn, MSW, LCSW [phone] (coverage)      Expected Discharge Plan: Skilled Nursing Facility Barriers to Discharge: Insurance Authorization, Continued Medical Work up, SNF Pending bed offer               Expected Discharge Plan and Services In-house Referral: Clinical Social Work                                             Social Drivers of Health (SDOH) Interventions SDOH Screenings   Food Insecurity: No Food Insecurity (10/23/2023)  Recent Concern: Food Insecurity - Food Insecurity Present (08/18/2023)  Housing: Low Risk  (10/23/2023)  Transportation Needs: No Transportation Needs (10/23/2023)  Utilities: Not At Risk (10/23/2023)  Alcohol Screen: Low Risk  (08/19/2023)  Depression (PHQ2-9): Medium Risk (07/26/2023)  Financial Resource Strain: Low Risk  (08/19/2023)  Physical Activity: Inactive (11/21/2022)  Social Connections: Socially Isolated (10/23/2023)  Stress: Stress Concern Present (11/21/2022)  Tobacco Use: High Risk (10/24/2023)  Health Literacy: Adequate Health Literacy (11/21/2022)    Readmission Risk Interventions    08/20/2023    4:20 PM  Readmission Risk Prevention Plan  Transportation Screening Complete  PCP or Specialist Appt within 3-5 Days Complete  HRI or Home Care Consult Complete  Palliative Care Screening Not Applicable  Medication Review (RN Care Manager) Complete

## 2023-10-26 NOTE — Progress Notes (Signed)
 PHARMACY - PHYSICIAN COMMUNICATION CRITICAL VALUE ALERT - BLOOD CULTURE IDENTIFICATION (BCID)  Anthony Norvil Martensen is an 68 y.o. male who presented to [facility] on 10/[facility]/2023 with a chief complaint of Left SDH and possible UTI.   Assessment:  BCx 1/4 growing GPC staph species (not aureus). Likely contaminant.   Name of physician (or Provider) Contacted: none  Current antibiotics: ceftriaxone   Changes to prescribed antibiotics recommended:  Patient is on recommended antibiotics - No changes needed  No results found for this or any previous visit.   Jinnie Door, PharmD, BCPS, BCCP Clinical Pharmacist  Please check AMION for all [facility] phone numbers After 10:00 PM, call Main Pharmacy [phone]

## 2023-10-26 NOTE — TOC Progression Note (Addendum)
 Transition of Care [facility]) - Progression Note    Patient Details  Name: [name] [name] MRN: [id number] Date of Birth: [birth year]  [facility] CM/SW Contact  Robynn Eileen Hoose, RN Phone Number: 10/26/2023, 10:23 AM  Clinical Narrative:   Spoke with son, expressed concern about patient safety being home alone.  Son reports patient has fallen 3 times in the last 3 months. Son has tried home health services and private pay for caregivers, but pt refuses their services. Son reports that he has made a report with APS regarding his concern for patient safety being home alone. Provider made aware.  1338: Phone call received from Provider. Plan of care is provider will meet with patient and his son, tomorrow, to come up with a discharge plan.    Expected Discharge Plan: Skilled Nursing Facility Barriers to Discharge: English As A Second Language Teacher, Continued Medical Work up, SNF Pending bed offer               Expected Discharge Plan and Services In-house Referral: Clinical Social Work                                             Social Drivers of Health (SDOH) Interventions SDOH Screenings   Food Insecurity: No Food Insecurity (10/23/2023)  Recent Concern: Food Insecurity - Food Insecurity Present (08/18/2023)  Housing: Low Risk  (10/23/2023)  Transportation Needs: No Transportation Needs (10/23/2023)  Utilities: Not At Risk (10/23/2023)  Alcohol Screen: Low Risk  (08/19/2023)  Depression (PHQ2-9): Medium Risk (07/26/2023)  Financial Resource Strain: Low Risk  (08/19/2023)  Physical Activity: Inactive (11/21/2022)  Social Connections: Socially Isolated (10/23/2023)  Stress: Stress Concern Present (11/21/2022)  Tobacco Use: High Risk (10/24/2023)  Health Literacy: Adequate Health Literacy (11/21/2022)    Readmission Risk Interventions    08/20/2023    4:20 PM  Readmission Risk Prevention Plan  Transportation Screening Complete  PCP or Specialist Appt  within 3-5 Days Complete  HRI or Home Care Consult Complete  Palliative Care Screening Not Applicable  Medication Review (RN Care Manager) Complete

## 2023-10-27 DIAGNOSIS — F411 Generalized anxiety disorder: Secondary | ICD-10-CM | POA: Diagnosis not present

## 2023-10-27 DIAGNOSIS — F331 Major depressive disorder, recurrent, moderate: Secondary | ICD-10-CM | POA: Diagnosis not present

## 2023-10-27 DIAGNOSIS — S065X0A Traumatic subdural hemorrhage without loss of consciousness, initial encounter: Secondary | ICD-10-CM

## 2023-10-27 DIAGNOSIS — R4181 Age-related cognitive decline: Secondary | ICD-10-CM

## 2023-10-27 DIAGNOSIS — S065XAA Traumatic subdural hemorrhage with loss of consciousness status unknown, initial encounter: Secondary | ICD-10-CM | POA: Diagnosis not present

## 2023-10-27 LAB — GLUCOSE, CAPILLARY
Glucose-Capillary: 159 mg/dL — ABNORMAL HIGH (ref 70–99)
Glucose-Capillary: 173 mg/dL — ABNORMAL HIGH (ref 70–99)
Glucose-Capillary: 261 mg/dL — ABNORMAL HIGH (ref 70–99)
Glucose-Capillary: 247 mg/dL — ABNORMAL HIGH (ref 70–99)

## 2023-10-27 MED ORDER — LOSARTAN POTASSIUM 50 MG PO TABS
50.0000 mg | ORAL_TABLET | Freq: Once | ORAL | Status: AC
  Administered 2023-10-27: 50 mg via ORAL
  Filled 2023-10-27: qty 1

## 2023-10-27 MED ORDER — LOSARTAN POTASSIUM 50 MG PO TABS
100.0000 mg | ORAL_TABLET | Freq: Every day | ORAL | Status: DC
Start: 2023-10-28 — End: 2023-11-06
  Administered 2023-10-28 – 2023-11-06 (×10): 100 mg via ORAL
  Filled 2023-10-27 (×10): qty 2

## 2023-10-27 MED ORDER — PAROXETINE HCL 30 MG PO TABS
30.0000 mg | ORAL_TABLET | Freq: Every day | ORAL | Status: DC
  Administered 2023-10-28 – 2023-11-06 (×10): 30 mg via ORAL
  Filled 2023-10-27 (×11): qty 1

## 2023-10-27 NOTE — Progress Notes (Signed)
 Pt refusing blood draw for lab work. Pt stated he would pass out and is in a bad mood and does not want to be poked because he will be going home soon. MD notified.

## 2023-10-27 NOTE — Progress Notes (Signed)
 PROGRESS NOTE  [name] [name] FMW:[id number] DOB: [birth year] DOA: 10/23/2023 PCP: [name], Mardy HERO, PA-C   LOS: 4 days   Brief Narrative / Interim history: 68 year old male with history of PAF, chronic bilateral pleural effusions, diastolic CHF, CAD with history of CABG, PAD, DM2, chronic decubitus ulcer comes into the hospital with an episode of unresponsiveness, he was found down, found to have acute left subdural hematoma.  Patient has no recollection as to what happened, but tells me that over the last 3 to 4 days he has been feeling sick to his stomach, has been having nausea and vomiting and feels very weak.  Subjective / 24h Interval events: Still wants to go home.  I told him that I plan to meet with him and his son around 4 PM today  Assesement and Plan: Principal problem Left SDH-4 mm, no midline shift on CT head.  Neurosurgery consulted, appreciate input, given stability there are no surgical recommendations at this point.  Neurosurgery recommends holding Eliquis  for 72 hours and restarted as normal afterwards.  Closely monitor, plan to start on Monday  Active problems Possible UTI -patient without significant urinary complaints however did have nausea, vomiting, diarrhea and overall weakness and abdominal discomfort at home.  Favor treating given pyuria and bacteriuria, plan for total of 5 days of antibiotics, last dose tomorrow  Syncope-EEG negative for seizures, 2D echocardiogram done 10/23 shows LVEF 60-65%, no WMA, RV systolic function and size were normal  AKI-likely in the setting of poor p.o. intake, nausea, vomiting.  Creatinine dialyzed  Essential hypertension-continue home losartan , increase dose to 100, amlodipine  was added, persistently hypertensive  Hyperlipidemia-continue statin  Social-patient lives home alone, son is very concerned about his living situation and actually got APS involved.  SNF was recommended, patient was agreeable initially however this  morning he does not wish to go to SNF and wants to go home.  I had a long discussion with the patient as well as the patient's son over the phone, it appears that currently he does have capacity to make medical decisions.  I detailed to him that if he is at home, and falls again, this may have catastrophic consequences if he is unable to reach the phone or nobody is checking on him.  The son tells me that this has been an ongoing issue and he is struggling with his dad, as the patient has been refusing care even when this has been offered.  He feels like home avenue is not a really good option moving forward as he will be alone and unable to care for himself. - Family meeting today at 4 PM  Scheduled Meds:  amLODipine   10 mg Oral Daily   atorvastatin   40 mg Oral Daily   feeding supplement  237 mL Oral BID BM   insulin  aspart  0-15 Units Subcutaneous TID WC   losartan   50 mg Oral Daily   mirtazapine   15 mg Oral QHS   PARoxetine   20 mg Oral Daily   Continuous Infusions:  cefTRIAXone  (ROCEPHIN )  IV 1 g (10/27/23 0827)   PRN Meds:.  Current Outpatient Medications  Medication Instructions   acetaminophen  (TYLENOL ) 500 mg, As needed   atorvastatin  (LIPITOR) 40 mg, Oral, Daily   Eliquis  5 mg, Oral, 2 times daily   furosemide  (LASIX ) 20 MG tablet 20-40 tablets, Daily PRN   glipiZIDE  (GLUCOTROL ) 5 mg, Oral, 2 times daily before meals   Loperamide  HCl (IMODIUM  PO) 4 mg, Oral, Every 4 hours PRN  loratadine  (CLARITIN ) 10 mg, Daily PRN   losartan  (COZAAR ) 50 mg, Oral, Daily   mirtazapine  (REMERON ) 15 MG tablet TAKE 1 TABLET BY MOUTH EVERYDAY AT BEDTIME   OVER THE COUNTER MEDICATION 500 mg, Oral, Daily, Opitmal Carnivore Organ Complex    PARoxetine  (PAXIL ) 20 mg, Oral, Daily   QUEtiapine  (SEROQUEL ) 25 mg, Oral, Daily at bedtime   sitaGLIPtin  (JANUVIA ) 100 mg, Oral, Daily   spironolactone  (ALDACTONE ) 25 mg, Oral, Daily   Vitamin D3 4,000 Units, Daily    Diet Orders (From admission, onward)      Start     Ordered   10/23/23 1538  Diet Carb Modified Fluid consistency: Thin; Room service appropriate? Yes  Diet effective now       Question Answer Comment  Diet-HS Snack? Nothing   Calorie Level Medium 1600-2000   Fluid consistency: Thin   Room service appropriate? Yes      10/23/23 1537            DVT prophylaxis: SCDs Start: 10/23/23 1536   Lab Results  Component Value Date   PLT 239 10/23/2023      Code Status: Full Code  Family Communication: No family at bedside  Status is: Inpatient Remains inpatient appropriate because: Severity of illness   Level of care: Telemetry Medical  Consultants:  Neurosurgery  Objective: Vitals:   10/26/23 1923 10/26/23 2311 10/27/23 0252 10/27/23 0745  BP: (!) 153/88 135/85 (!) 160/85 (!) 164/73  Pulse: (!) 56 68 69 62  Resp: 20 15 (!) 22 (!) 26  Temp: 97.6 F (36.4 C) 98 F (36.7 C) 98.3 F (36.8 C) 98.5 F (36.9 C)  TempSrc: Oral Oral Oral Oral  SpO2: 98% 99% 100% 96%  Weight:      Height:        Intake/Output Summary (Last 24 hours) at 10/27/2023 1019 Last data filed at 10/27/2023 0748 Gross per 24 hour  Intake --  Output 1750 ml  Net -1750 ml   Wt Readings from Last 3 Encounters:  10/24/23 65.5 kg  09/30/23 66.2 kg  09/26/23 66.7 kg    Examination:  Constitutional: NAD Eyes: lids and conjunctivae normal, no scleral icterus ENMT: mmm Neck: normal, supple Respiratory: clear to auscultation bilaterally, no wheezing, no crackles.  Cardiovascular: Regular rate and rhythm, no murmurs / rubs / gallops.  Abdomen: soft, no distention, no tenderness. Bowel sounds positive.   Data Reviewed: I have independently reviewed following labs and imaging studies   CBC Recent Labs  Lab 10/23/23 0933 10/23/23 0939 10/23/23 1011  WBC 10.8*  --   --   HGB 9.3* 10.2* 9.9*  HCT 29.2* 30.0* 29.0*  PLT 239  --   --   MCV 86.4  --   --   MCH 27.5  --   --   MCHC 31.8  --   --   RDW 15.6*  --   --    LYMPHSABS 0.6*  --   --   MONOABS 0.7  --   --   EOSABS 0.0  --   --   BASOSABS 0.0  --   --     Recent Labs  Lab 10/23/23 0933 10/23/23 0939 10/23/23 1011 10/24/23 0516  NA 132* 135 135 137  K 5.0 5.0 5.1 4.6  CL 103 102  --  106  CO2 19*  --   --  21*  GLUCOSE 277* 279*  --  203*  BUN 50* 56*  --  47*  CREATININE  1.19 1.10  --  1.06  CALCIUM  8.5*  --   --  8.4*  AST 22  --   --   --   ALT 16  --   --   --   ALKPHOS 179*  --   --   --   BILITOT 0.8  --   --   --   ALBUMIN 2.6*  --   --   --   MG 1.7  --   --   --   INR 1.8*  --   --   --     ------------------------------------------------------------------------------------------------------------------ No results for input(s): CHOL, HDL, LDLCALC, TRIG, CHOLHDL, LDLDIRECT in the last 72 hours.  Lab Results  Component Value Date   HGBA1C 9.1 (H) 07/26/2023   ------------------------------------------------------------------------------------------------------------------ No results for input(s): TSH, T4TOTAL, T3FREE, THYROIDAB in the last 72 hours.  Invalid input(s): FREET3  Cardiac Enzymes No results for input(s): CKMB, TROPONINI, MYOGLOBIN in the last 168 hours.  Invalid input(s): CK ------------------------------------------------------------------------------------------------------------------    Component Value Date/Time   BNP 184.1 (H) 09/12/2023 1200    CBG: Recent Labs  Lab 10/26/23 0804 10/26/23 1142 10/26/23 1559 10/26/23 2122 10/27/23 0746  GLUCAP 237* 239* 121* 119* 159*    Recent Results (from the past 240 hours)  Culture, blood (Routine X 2) w Reflex to ID Panel     Status: None (Preliminary result)   Collection Time: 10/23/23 11:20 PM   Specimen: BLOOD  Result Value Ref Range Status   Specimen Description BLOOD RIGHT ANTECUBITAL  Final   Special Requests   Final    BOTTLES DRAWN AEROBIC AND ANAEROBIC Blood Culture results may not be optimal due to an  inadequate volume of blood received in culture bottles   Culture  Setup Time   Final    GRAM POSITIVE COCCI AEROBIC BOTTLE ONLY CRITICAL RESULT CALLED TO, READ BACK BY AND VERIFIED WITH: PHARMD LYDIA CHEN [id number] AT 0837 BY EC    Culture   Final    GRAM POSITIVE COCCI IDENTIFICATION TO FOLLOW Performed at [facility], [address] [place], [place] [address]    Report Status PENDING  Incomplete  Blood Culture ID Panel (Reflexed)     Status: Abnormal   Collection Time: 10/23/23 11:20 PM  Result Value Ref Range Status   Enterococcus faecalis NOT DETECTED NOT DETECTED Final   Enterococcus Faecium NOT DETECTED NOT DETECTED Final   Listeria monocytogenes NOT DETECTED NOT DETECTED Final   Staphylococcus species DETECTED (A) NOT DETECTED Final    Comment: CRITICAL RESULT CALLED TO, READ BACK BY AND VERIFIED WITH: PHARMD LYDIA CHEN [phone] AT 0837 BY EC    Staphylococcus aureus (BCID) NOT DETECTED NOT DETECTED Final   Staphylococcus epidermidis NOT DETECTED NOT DETECTED Final   Staphylococcus lugdunensis NOT DETECTED NOT DETECTED Final   Streptococcus species NOT DETECTED NOT DETECTED Final   Streptococcus agalactiae NOT DETECTED NOT DETECTED Final   Streptococcus pneumoniae NOT DETECTED NOT DETECTED Final   Streptococcus pyogenes NOT DETECTED NOT DETECTED Final   A.calcoaceticus-baumannii NOT DETECTED NOT DETECTED Final   Bacteroides fragilis NOT DETECTED NOT DETECTED Final   Enterobacterales NOT DETECTED NOT DETECTED Final   Enterobacter cloacae complex NOT DETECTED NOT DETECTED Final   Escherichia coli NOT DETECTED NOT DETECTED Final   Klebsiella aerogenes NOT DETECTED NOT DETECTED Final   Klebsiella oxytoca NOT DETECTED NOT DETECTED Final   Klebsiella pneumoniae NOT DETECTED NOT DETECTED Final   Proteus species NOT DETECTED NOT DETECTED Final  Salmonella species NOT DETECTED NOT DETECTED Final   Serratia marcescens NOT DETECTED NOT DETECTED Final   Haemophilus  influenzae NOT DETECTED NOT DETECTED Final   Neisseria meningitidis NOT DETECTED NOT DETECTED Final   Pseudomonas aeruginosa NOT DETECTED NOT DETECTED Final   Stenotrophomonas maltophilia NOT DETECTED NOT DETECTED Final   Candida albicans NOT DETECTED NOT DETECTED Final   Candida auris NOT DETECTED NOT DETECTED Final   Candida glabrata NOT DETECTED NOT DETECTED Final   Candida krusei NOT DETECTED NOT DETECTED Final   Candida parapsilosis NOT DETECTED NOT DETECTED Final   Candida tropicalis NOT DETECTED NOT DETECTED Final   Cryptococcus neoformans/gattii NOT DETECTED NOT DETECTED Final    Comment: Performed at [facility], [address] [place], [place] [address]  Culture, blood (Routine X 2) w Reflex to ID Panel     Status: None (Preliminary result)   Collection Time: 10/23/23 11:44 PM   Specimen: BLOOD RIGHT ARM  Result Value Ref Range Status   Specimen Description BLOOD RIGHT ARM  Final   Special Requests   Final    BOTTLES DRAWN AEROBIC AND ANAEROBIC Blood Culture adequate volume   Culture   Final    NO GROWTH 3 DAYS Performed at [facility], [address], [place], [place] [address]    Report Status PENDING  Incomplete     Radiology Studies: No results found.  Nilda Fendt, MD, PhD [facility]  Between 7 am - 7 pm I am available, please contact me via Amion (for emergencies) or Securechat (non urgent messages)  Between 7 pm - 7 am I am not available, please contact night coverage MD/APP via Amion

## 2023-10-27 NOTE — Consult Note (Addendum)
 [facility] Psychiatric Consult Initial  Patient Name: .[name] [name]  MRN: [id number]  DOB: 09/18/1955  Consult Order details:  Orders (From admission, onward)     Start     Ordered   10/26/23 1338  IP CONSULT TO PSYCHIATRY       Ordering Provider: Trixie Nilda HERO, MD  Provider:  (Not yet assigned)  Question Answer Comment  Location [facility]   Reason for Consult? Depression, history of suicidal attempt, significant self-neglect      10/26/23 1338             Mode of Visit: In person    Psychiatry Consult Evaluation  Service Date: October 27, 2023 LOS:  LOS: 4 days  Chief Complaint I enjoy living by myself.  I'm happy at home.  Primary Psychiatric Diagnoses  Major depressive disorder, recurrent, moderate Anxiety Cognitive issues  Assessment  [name] [name] is a 68 y.o. male admitted: Medicallyfor 10/23/2023  9:01 AM with medical history significant for PAF, chronic bilateral pleural effusions, diastolic CHF, CAD with history of CABG, PAD, DM2, chronic decubitus ulcer comes into the hospital with an episode of unresponsiveness, he was found down, found to have acute left subdural hematoma, N/V, and weakness.  Psychiatric history of depression and anxiety.  His current presentation of wandering behaviors at home, confusion at times, not pushing his emergency button after his last fall and declining care from home health is most consistent with cognitive issues, decline. Current outpatient psychotropic medications include Paxil  and Remeron  and historically he has had a positive response to these medications. He was compliant with medications prior to admission as evidenced by his report and his son, caregiver. On initial examination, patient was sitting up in bed eating heartily a breakfast of bacon and toast.  He was pleasant and cooperative during the examination with issues remembering the distant past and the fall prior to admission. Please  see plan below for detailed recommendations.   Diagnoses:  Active Hospital problems: Principal Problem:   Subdural hematoma (HCC) Active Problems:   Major depressive disorder, recurrent episode, moderate (HCC)    Plan   ## Psychiatric Medication Recommendations:  -Increase Paxil  20 mg to 30 mg daily -Continue Remeron  15 mg at bedtime  ## Medical Decision Making Capacity: Not specifically addressed in this encounter  ## Further Work-up:  -- most recent EKG on 10/25/2023 had QtC of 447 -- Pertinent labwork reviewed earlier this admission includes: CBC with diff, chem panel, EKG, U/A  ## Disposition:-- There are no psychiatric contraindications to discharge at this time  ## Behavioral / Environmental: - No specific recommendations at this time.     ## Safety and Observation Level:  - Based on my clinical evaluation, I estimate the patient to be at minimal risk of self harm in the current setting. - At this time, we recommend  routine. This decision is based on my review of the chart including patient's history and current presentation, interview of the patient, mental status examination, and consideration of suicide risk including evaluating suicidal ideation, plan, intent, suicidal or self-harm behaviors, risk factors, and protective factors. This judgment is based on our ability to directly address suicide risk, implement suicide prevention strategies, and develop a safety plan while the patient is in the clinical setting. Please contact our team if there is a concern that risk level has changed.  CSSR Risk Category:C-SSRS RISK CATEGORY: No Risk  Suicide Risk Assessment: Patient has following modifiable risk factors for suicide:  under treated depression which we are addressing by increasing his Paxil . Patient has following non-modifiable or demographic risk factors for suicide: male gender and psychiatric hospitalization Patient has the following protective factors against suicide:  Supportive family, no history of suicide attempts, and no history of NSSIB  Thank you for this consult request. Recommendations have been communicated to the primary team.  We will sign off at this time.   Sharlot Becker, NP       History of Present Illness  Relevant Aspects of [facility] Course:  Admitted on 10/23/2023 for with medical history significant for PAF, chronic bilateral pleural effusions, diastolic CHF, CAD with history of CABG, PAD, DM2, chronic decubitus ulcer comes into the hospital with an episode of unresponsiveness, he was found down, found to have acute left subdural hematoma, N/V, and weakness.  Psychiatric history of depression and anxiety.   Patient Report:  The client reported he was doing fine as he was sitting up in bed eating his breakfast heartily (bacon and toast).  He stated last night, he slept pretty well.  Appetite wass fine.  He said he is usually not depressed but it's getting lower every day and ranked it as moderate.  When asked about the discrepancy, he responded, I enjoy living by myself.  I'm happy at home.  He disagrees with his son's desire to move him into a SNF.  No past suicide attempts with one psychiatric admission in 2021 for suicidal ideations to which his son reported moving out his guns in the home.  [name] [name] denies current suicidal ideations, hallucinations, paranoia, and substance abuse.  He does worry with difficulty controlling it and it runs in my family.  No recent panic attacks.  When asked if he fell or got sick at home, what would he do, he stated, I would do what I always do, get up and keep going.  Then elaborated on the question to which he said he would push his emergency button he wears.  Unfortunately, he did not prior to admission, when he fell and hit his head to which he did not remember.  His son is concerned about his lack of sanitation at home with refusing care with his incontinence that lead to infections  and safety with falling along with multiple hospitalizations this year.  He works out of town and his father continues to refuse care from people he hires to help him.  His son also reported wandering episodes out of his apartment and cognitive decline significantly increasing since June.  Discussed neurology consult for this issue.    Psych ROS:  Depression: moderate Anxiety:  high Mania (lifetime and current): none Psychosis: (lifetime and current): none  Collateral information:  Contacted son, Anddy Wingert, on 10/27/2023 who's biggest concern is with sanitation.  He has been his father's caretaker since early [id number], every year he goes down a little bit, his baseline gets lower.  This year has been a real struggle with his cognitive abilities.  He had a long hospitalization earlier this year and steadily going down since June and started having fecal incontinence in July that lead to a UTI with an admission in August.  Home health placed at discharge with him starting to refuse care, baths and changing incontinence.  This leads to UTI and recently a fall that lead him to this admission.    When discussing psychiatric issues, he stated his father has been depressed his whole life with a hospitalization in 2021 for suicidal ideations that  led him to remove his guns from the home.  No suicide attempts.    Review of Systems  Psychiatric/Behavioral:  Positive for depression. The patient is nervous/anxious.   All other systems reviewed and are negative.    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 rate (!) 22, height 6' 2 (1.88 m), weight 65.5 kg, SpO2 100%. Body mass index is 18.54 kg/m.  Physical Exam  Mental Status Exam: General Appearance: Casual  Orientation:  Full (Time, Place, and Person)  Memory:  Immediate;   Good Recent;   Good Remote;   Fair  Concentration:  Concentration: Good and Attention Span: Good  Recall:  Good  Attention  Good  Eye Contact:  Good  Speech:  Normal Rate  Language:  Good  Volume:  Normal  Mood: depression, anxious  Affect:  Blunt  Thought Process:  Coherent  Thought Content:  Logical  Suicidal Thoughts:  No  Homicidal Thoughts:  No  Judgement:  Fair  Insight:  Fair  Psychomotor Activity:  Decreased  Akathisia:  No  Fund of Knowledge:  Good      Assets:  Housing Leisure Time Resilience Social Support  Cognition:  WNL  ADL's:  Intact  AIMS (if indicated):        Other History   These have been pulled in through the EMR, reviewed, and updated if appropriate.  Family History:  The patient's family history includes Cancer in his paternal grandmother; Colon cancer in his paternal grandmother; Lymphoma in his father.  Medical History: Past Medical History:  Diagnosis Date   Acute urinary retention 01/04/2022   Altered mental status 08/18/2023   Anxiety    Arthritis    Cataract    CHF (congestive heart failure) (HCC)    Constipation    pt states goes EOD- hard stools    Coronary artery disease    Depression    Diabetes mellitus without  complication (HCC)    Diabetic retinopathy (HCC)    DKA (diabetic ketoacidoses)    Hyperlipidemia    Hypertension    off meds   Hypertensive retinopathy    Lesion of ulnar nerve, left upper limb 07/12/2017   Non-intractable vomiting without nausea 06/10/2018   Postherpetic neuralgia 08/11/2021   Speculative diagnosis   Psoriasis    Tubular adenoma of colon 03/2019   UTI (urinary tract infection) 01/05/2022    Surgical History: Past Surgical History:  Procedure Laterality Date   ABDOMINAL AORTOGRAM W/LOWER EXTREMITY N/A 08/30/2020   Procedure: ABDOMINAL AORTOGRAM W/LOWER EXTREMITY;  Surgeon: Serene Gaile ORN, MD;  Location: [facility]VAS[facility];  Service: Cardiovascular;  Laterality: N/A;   ABDOMINAL AORTOGRAM W/LOWER EXTREMITY N/A  11/15/2020   Procedure: ABDOMINAL AORTOGRAM W/LOWER EXTREMITY;  Surgeon: Serene Gaile ORN, MD;  Location: [facility];  Service: Cardiovascular;  Laterality: N/A;   ANKLE SURGERY     right   CARDIAC CATHETERIZATION  09/25/2001   COLONOSCOPY     ~10 yr ago- normal  per pt    CORONARY ARTERY BYPASS GRAFT  09/30/2001   CABG x 4   IR THORACENTESIS ASP PLEURAL SPACE W/IMG GUIDE  05/19/2020   IR THORACENTESIS ASP PLEURAL SPACE W/IMG GUIDE  02/04/2023   LAPAROSCOPIC INGUINAL HERNIA REPAIR Bilateral 02/09/2010   MASS EXCISION Left 03/12/2013   Procedure: LEFT INDEX EXCISION MASS AND DEBRIDMENT DISTAL INTERPHALANGEAL JOINT;  Surgeon: Franky JONELLE Curia, MD;  Location: [facility];  Service: Orthopedics;  Laterality: Left;   PERIPHERAL VASCULAR BALLOON ANGIOPLASTY Left 08/30/2020   Procedure: PERIPHERAL VASCULAR BALLOON ANGIOPLASTY;  Surgeon: Serene Gaile ORN, MD;  Location: [facility];  Service: Cardiovascular;  Laterality: Left;  Peroneal   TRANSESOPHAGEAL ECHOCARDIOGRAM (CATH LAB) N/A 11/26/2022   Procedure: TRANSESOPHAGEAL ECHOCARDIOGRAM;  Surgeon: Kate Lonni CROME, MD;  Location: [facility] INVASIVE CV LAB;  Service: Cardiovascular;  Laterality:  N/A;     Medications:   Current Facility-Administered Medications:    amLODipine  (NORVASC ) tablet 10 mg, 10 mg, Oral, Daily, Gherghe, Costin M, MD, 10 mg at 10/26/23 0841   atorvastatin  (LIPITOR) tablet 40 mg, 40 mg, Oral, Daily, Georgina Basket, MD, 40 mg at 10/26/23 0841   cefTRIAXone  (ROCEPHIN ) 1 g in sodium chloride  0.9 % 100 mL IVPB, 1 g, Intravenous, Q24H, Gherghe, Costin M, MD, Last Rate: 200 mL/hr at 10/26/23 0846, 1 g at 10/26/23 0846   feeding supplement (ENSURE PLUS HIGH PROTEIN) liquid 237 mL, 237 mL, Oral, BID BM, Gherghe, Costin M, MD, 237 mL at 10/26/23 1229   insulin  aspart (novoLOG ) injection 0-15 Units, 0-15 Units, Subcutaneous, TID WC, Georgina Basket, MD, 2 Units at 10/26/23 1644   losartan  (COZAAR ) tablet 50 mg, 50 mg, Oral, Daily, Georgina Basket, MD, 50 mg at 10/26/23 9157   mirtazapine  (REMERON ) tablet 15 mg, 15 mg, Oral, QHS, Gherghe, Costin M, MD, 15 mg at 10/26/23 2100   PARoxetine  (PAXIL ) tablet 20 mg, 20 mg, Oral, Daily, Georgina Basket, MD, 20 mg at 10/26/23 9158  Allergies: Allergies  Allergen Reactions   Other Hives and Itching    Wool fabric - hives, itching    Sharlot Becker, NP

## 2023-10-28 ENCOUNTER — Ambulatory Visit: Admitting: Physician Assistant

## 2023-10-28 DIAGNOSIS — S065XAA Traumatic subdural hemorrhage with loss of consciousness status unknown, initial encounter: Secondary | ICD-10-CM | POA: Diagnosis not present

## 2023-10-28 LAB — CBC
HCT: 25.2 % — ABNORMAL LOW (ref 39.0–52.0)
Hemoglobin: 8 g/dL — ABNORMAL LOW (ref 13.0–17.0)
MCH: 27.2 pg (ref 26.0–34.0)
MCHC: 31.7 g/dL (ref 30.0–36.0)
MCV: 85.7 fL (ref 80.0–100.0)
Platelets: 220 K/uL (ref 150–400)
RBC: 2.94 MIL/uL — ABNORMAL LOW (ref 4.22–5.81)
RDW: 15.7 % — ABNORMAL HIGH (ref 11.5–15.5)
WBC: 6.9 K/uL (ref 4.0–10.5)
nRBC: 0 % (ref 0.0–0.2)

## 2023-10-28 LAB — COMPREHENSIVE METABOLIC PANEL WITH GFR
ALT: 14 U/L (ref 0–44)
AST: 19 U/L (ref 15–41)
Albumin: 1.8 g/dL — ABNORMAL LOW (ref 3.5–5.0)
Alkaline Phosphatase: 138 U/L — ABNORMAL HIGH (ref 38–126)
Anion gap: 8 (ref 5–15)
BUN: 43 mg/dL — ABNORMAL HIGH (ref 8–23)
CO2: 22 mmol/L (ref 22–32)
Calcium: 7.8 mg/dL — ABNORMAL LOW (ref 8.9–10.3)
Chloride: 101 mmol/L (ref 98–111)
Creatinine, Ser: 1.33 mg/dL — ABNORMAL HIGH (ref 0.61–1.24)
GFR, Estimated: 58 mL/min — ABNORMAL LOW (ref >60)
Glucose, Bld: 287 mg/dL — ABNORMAL HIGH (ref 70–99)
Potassium: 4.6 mmol/L (ref 3.5–5.1)
Sodium: 131 mmol/L — ABNORMAL LOW (ref 135–145)
Total Bilirubin: 0.4 mg/dL (ref 0.0–1.2)
Total Protein: 5.7 g/dL — ABNORMAL LOW (ref 6.5–8.1)

## 2023-10-28 LAB — CULTURE, BLOOD (ROUTINE X 2)

## 2023-10-28 LAB — VITAMIN B12: Vitamin B-12: 497 pg/mL (ref 180–914)

## 2023-10-28 LAB — GLUCOSE, CAPILLARY
Glucose-Capillary: 249 mg/dL — ABNORMAL HIGH (ref 70–99)
Glucose-Capillary: 206 mg/dL — ABNORMAL HIGH (ref 70–99)
Glucose-Capillary: 247 mg/dL — ABNORMAL HIGH (ref 70–99)
Glucose-Capillary: 257 mg/dL — ABNORMAL HIGH (ref 70–99)

## 2023-10-28 LAB — MAGNESIUM: Magnesium: 1.6 mg/dL — ABNORMAL LOW (ref 1.7–2.4)

## 2023-10-28 LAB — TSH: TSH: 1.904 u[IU]/mL (ref 0.350–4.500)

## 2023-10-28 MED ORDER — MAGNESIUM SULFATE 2 GM/50ML IV SOLN
2.0000 g | Freq: Once | INTRAVENOUS | Status: AC
  Administered 2023-10-28: 2 g via INTRAVENOUS
  Filled 2023-10-28: qty 50

## 2023-10-28 MED ORDER — APIXABAN 5 MG PO TABS
5.0000 mg | ORAL_TABLET | Freq: Two times a day (BID) | ORAL | Status: DC
  Administered 2023-10-28 – 2023-11-06 (×19): 5 mg via ORAL
  Filled 2023-10-28: qty 2
  Filled 2023-10-28 (×18): qty 1

## 2023-10-28 MED ORDER — THIAMINE HCL 100 MG/ML IJ SOLN
500.0000 mg | Freq: Three times a day (TID) | INTRAVENOUS | Status: AC
  Administered 2023-10-28 – 2023-10-30 (×9): 500 mg via INTRAVENOUS
  Filled 2023-10-28 (×5): qty 5
  Filled 2023-10-28: qty 500
  Filled 2023-10-28 (×4): qty 5

## 2023-10-28 MED ORDER — VITAMIN B-12 1000 MCG PO TABS
1000.0000 ug | ORAL_TABLET | Freq: Every day | ORAL | Status: DC
Start: 2023-10-28 — End: 2023-11-06
  Administered 2023-10-28 – 2023-11-06 (×10): 1000 ug via ORAL
  Filled 2023-10-28 (×10): qty 1

## 2023-10-28 MED ORDER — INSULIN ASPART 100 UNIT/ML IJ SOLN
0.0000 [IU] | Freq: Three times a day (TID) | INTRAMUSCULAR | Status: DC
  Administered 2023-10-28: 7 [IU] via SUBCUTANEOUS
  Administered 2023-10-29: 11 [IU] via SUBCUTANEOUS
  Administered 2023-10-29 – 2023-10-30 (×3): 7 [IU] via SUBCUTANEOUS
  Administered 2023-10-31: 4 [IU] via SUBCUTANEOUS
  Administered 2023-10-31: 3 [IU] via SUBCUTANEOUS
  Administered 2023-10-31: 4 [IU] via SUBCUTANEOUS
  Administered 2023-11-01 (×2): 7 [IU] via SUBCUTANEOUS
  Administered 2023-11-02: 3 [IU] via SUBCUTANEOUS
  Administered 2023-11-03 (×2): 4 [IU] via SUBCUTANEOUS
  Administered 2023-11-04: 7 [IU] via SUBCUTANEOUS
  Administered 2023-11-04: 4 [IU] via SUBCUTANEOUS
  Administered 2023-11-05: 3 [IU] via SUBCUTANEOUS
  Administered 2023-11-05 (×2): 4 [IU] via SUBCUTANEOUS
  Administered 2023-11-06 (×2): 3 [IU] via SUBCUTANEOUS
  Filled 2023-10-28: qty 3
  Filled 2023-10-28: qty 7
  Filled 2023-10-28 (×2): qty 3
  Filled 2023-10-28: qty 4
  Filled 2023-10-28: qty 3
  Filled 2023-10-28: qty 7
  Filled 2023-10-28 (×2): qty 4
  Filled 2023-10-28: qty 7
  Filled 2023-10-28 (×2): qty 4

## 2023-10-28 NOTE — Care Management Important Message (Signed)
 Important Message  Patient Details  Name: [name] [name] MRN: [id number] Date of Birth: January 20, 1955   Important Message Given:  Yes - Medicare IM     Jon Cruel 10/28/2023, 1:31 PM

## 2023-10-28 NOTE — Progress Notes (Signed)
 PT Cancellation Note  Patient Details Name: Erie Radu MRN: [id number] DOB: [birth year]   Cancelled Treatment:    Reason Eval/Treat Not Completed: Patient declined, no reason specified  Despite attempts to explain risks of inactivity and benefits of activity, pt repeatedly refused to participate. Ultimately he closed his eyes and stopped responding.    Macario RAMAN, PT Acute Rehabilitation Services  Office [phone]  Macario SHAUNNA Soja 10/28/2023, 9:34 AM

## 2023-10-28 NOTE — Progress Notes (Addendum)
 PROGRESS NOTE  [name] [name] FMW:[id number] DOB: February 11, 1955 DOA: 10/23/2023 PCP: [name], Mardy HERO, PA-C   LOS: 5 days   Brief Narrative / Interim history: 68 year old male with history of PAF, chronic bilateral pleural effusions, diastolic CHF, CAD with history of CABG, PAD, DM2, chronic decubitus ulcer comes into the hospital with an episode of unresponsiveness, he was found down, found to have acute left subdural hematoma.  Patient has no recollection as to what happened, but tells me that over the last 3 to 4 days he has been feeling sick to his stomach, has been having nausea and vomiting and feels very weak.  Subjective / 24h Interval events: Doing well today, about to eat breakfast.  No chest pain, no shortness of breath  Assesement and Plan: Principal problem Left SDH-4 mm, no midline shift on CT head.  Neurosurgery consulted, appreciate input, given stability there are no surgical recommendations at this point.  Neurosurgery recommends holding Eliquis  for 72 hours and restarted as normal afterwards.  Closely monitor, plan to start later today  Active problems Possible UTI -patient without significant urinary complaints however did have nausea, vomiting, diarrhea and overall weakness and abdominal discomfort at home.  Favor treating given pyuria and bacteriuria, plan for total of 5 days of antibiotics, last dose today  Syncope-EEG negative for seizures, 2D echocardiogram done 10/23 shows LVEF 60-65%, no WMA, RV systolic function and size were normal  AKI-likely in the setting of poor p.o. intake, nausea, vomiting.  Creatinine dialyzed  Essential hypertension-continue home losartan , increase dose to 100, amlodipine  was added, persistently hypertensive  Hypomagnesemia-replenish magnesium   CKD 3A-creatinine at baseline  Hyperlipidemia-continue statin  Social-overall very difficult social situation.  Met with the patient and his son on Sunday and discussed about the  patient's living situation.  Sunday morning patient answered orientation questions and seemed appropriate.  Upon meeting with the son at 4 PM, the patient started telling me that he left the room was transported into some form of rehab building adjacent to the hospital where they had a bunch of basketball hoops.  It appeared that the patient was confabulating.  Son told me that this is actually his baseline, he seems appropriate but a lot of time the stories are not real.  On Friday, Saturday and Sunday patient gave me 3 different versions of how he fell at home, and per son none of them are true.  In the light of these new findings, I do suspect he may have some additional underlying cognitive issues that are well masked at baseline.  Obtain SLP consult for cognitive evaluation, psychiatry saw patient over the weekend, but overall does not seem safe to be home alone.  He has been refusing help in the past and has refused working with PT this morning which makes things more complicated.  Given recurrent confabulation, severe malnutrition, recent significant weight loss will start high-dose thiamine , obtain thiamine  levels.  B12 normal but on the lower side, replaced that as well  Scheduled Meds:  amLODipine   10 mg Oral Daily   atorvastatin   40 mg Oral Daily   feeding supplement  237 mL Oral BID BM   insulin  aspart  0-15 Units Subcutaneous TID WC   losartan   100 mg Oral Daily   mirtazapine   15 mg Oral QHS   PARoxetine   30 mg Oral Daily   Continuous Infusions:  magnesium  sulfate bolus IVPB 2 g (10/28/23 0936)   thiamine  (VITAMIN B1) injection Stopped (10/28/23 0933)   PRN Meds:.  Current Outpatient Medications  Medication Instructions   acetaminophen  (TYLENOL ) 500 mg, As needed   atorvastatin  (LIPITOR) 40 mg, Oral, Daily   Eliquis  5 mg, Oral, 2 times daily   furosemide  (LASIX ) 20 MG tablet 20-40 tablets, Daily PRN   glipiZIDE  (GLUCOTROL ) 5 mg, Oral, 2 times daily before meals   Loperamide  HCl  (IMODIUM  PO) 4 mg, Oral, Every 4 hours PRN   loratadine  (CLARITIN ) 10 mg, Daily PRN   losartan  (COZAAR ) 50 mg, Oral, Daily   mirtazapine  (REMERON ) 15 MG tablet TAKE 1 TABLET BY MOUTH EVERYDAY AT BEDTIME   OVER THE COUNTER MEDICATION 500 mg, Oral, Daily, Opitmal Carnivore Organ Complex    PARoxetine  (PAXIL ) 20 mg, Oral, Daily   QUEtiapine  (SEROQUEL ) 25 mg, Oral, Daily at bedtime   sitaGLIPtin  (JANUVIA ) 100 mg, Oral, Daily   spironolactone  (ALDACTONE ) 25 mg, Oral, Daily   Vitamin D3 4,000 Units, Daily    Diet Orders (From admission, onward)     Start     Ordered   10/23/23 1538  Diet Carb Modified Fluid consistency: Thin; Room service appropriate? Yes  Diet effective now       Question Answer Comment  Diet-HS Snack? Nothing   Calorie Level Medium 1600-2000   Fluid consistency: Thin   Room service appropriate? Yes      10/23/23 1537            DVT prophylaxis: SCDs Start: 10/23/23 1536   Lab Results  Component Value Date   PLT 220 10/28/2023      Code Status: Full Code  Family Communication: No family at bedside  Status is: Inpatient Remains inpatient appropriate because: Severity of illness   Level of care: Telemetry Medical  Consultants:  Neurosurgery  Objective: Vitals:   10/28/23 0515 10/28/23 0530 10/28/23 0545 10/28/23 0729  BP:    131/77  Pulse: (!) 53 (!) 51 (!) 56 65  Resp:    18  Temp:    99.2 F (37.3 C)  TempSrc:    Oral  SpO2: 96% 96% 95% 95%  Weight:      Height:        Intake/Output Summary (Last 24 hours) at 10/28/2023 0950 Last data filed at 10/28/2023 0532 Gross per 24 hour  Intake --  Output 675 ml  Net -675 ml   Wt Readings from Last 3 Encounters:  10/24/23 65.5 kg  09/30/23 66.2 kg  09/26/23 66.7 kg    Examination:  Constitutional: NAD Eyes: lids and conjunctivae normal, no scleral icterus ENMT: mmm Neck: normal, supple Respiratory: clear to auscultation bilaterally, no wheezing, no crackles.  Cardiovascular:  Regular rate and rhythm, no murmurs / rubs / gallops. No LE edema. Abdomen: soft, no distention, no tenderness. Bowel sounds positive.   Data Reviewed: I have independently reviewed following labs and imaging studies   CBC Recent Labs  Lab 10/23/23 0933 10/23/23 0939 10/23/23 1011 10/28/23 0503  WBC 10.8*  --   --  6.9  HGB 9.3* 10.2* 9.9* 8.0*  HCT 29.2* 30.0* 29.0* 25.2*  PLT 239  --   --  220  MCV 86.4  --   --  85.7  MCH 27.5  --   --  27.2  MCHC 31.8  --   --  31.7  RDW 15.6*  --   --  15.7*  LYMPHSABS 0.6*  --   --   --   MONOABS 0.7  --   --   --   EOSABS 0.0  --   --   --  BASOSABS 0.0  --   --   --     Recent Labs  Lab 10/23/23 0933 10/23/23 0939 10/23/23 1011 10/24/23 0516 10/28/23 0503  NA 132* 135 135 137 131*  K 5.0 5.0 5.1 4.6 4.6  CL 103 102  --  106 101  CO2 19*  --   --  21* 22  GLUCOSE 277* 279*  --  203* 287*  BUN 50* 56*  --  47* 43*  CREATININE 1.19 1.10  --  1.06 1.33*  CALCIUM  8.5*  --   --  8.4* 7.8*  AST 22  --   --   --  19  ALT 16  --   --   --  14  ALKPHOS 179*  --   --   --  138*  BILITOT 0.8  --   --   --  0.4  ALBUMIN 2.6*  --   --   --  1.8*  MG 1.7  --   --   --  1.6*  INR 1.8*  --   --   --   --   TSH  --   --   --   --  1.904    ------------------------------------------------------------------------------------------------------------------ No results for input(s): CHOL, HDL, LDLCALC, TRIG, CHOLHDL, LDLDIRECT in the last 72 hours.  Lab Results  Component Value Date   HGBA1C 9.1 (H) 07/26/2023   ------------------------------------------------------------------------------------------------------------------ Recent Labs    10/28/23 0503  TSH 1.904    Cardiac Enzymes No results for input(s): CKMB, TROPONINI, MYOGLOBIN in the last 168 hours.  Invalid input(s): CK ------------------------------------------------------------------------------------------------------------------    Component  Value Date/Time   BNP 184.1 (H) 09/12/2023 1200    CBG: Recent Labs  Lab 10/27/23 0746 10/27/23 1207 10/27/23 1638 10/27/23 2104 10/28/23 0829  GLUCAP 159* 173* 261* 247* 249*    Recent Results (from the past 240 hours)  Culture, blood (Routine X 2) w Reflex to ID Panel     Status: Abnormal   Collection Time: 10/23/23 11:20 PM   Specimen: BLOOD  Result Value Ref Range Status   Specimen Description BLOOD RIGHT ANTECUBITAL  Final   Special Requests   Final    BOTTLES DRAWN AEROBIC AND ANAEROBIC Blood Culture results may not be optimal due to an inadequate volume of blood received in culture bottles   Culture  Setup Time   Final    GRAM POSITIVE COCCI AEROBIC BOTTLE ONLY CRITICAL RESULT CALLED TO, READ BACK BY AND VERIFIED WITH: PHARMD LYDIA CHEN [id number] AT 0837 BY EC    Culture (A)  Final    STAPHYLOCOCCUS AURICULARIS THE SIGNIFICANCE OF ISOLATING THIS ORGANISM FROM A SINGLE SET OF BLOOD CULTURES WHEN MULTIPLE SETS ARE DRAWN IS UNCERTAIN. PLEASE NOTIFY THE MICROBIOLOGY DEPARTMENT WITHIN ONE WEEK IF SPECIATION AND SENSITIVITIES ARE REQUIRED. Performed at [facility], [address], [place], [place] [address]    Report Status 10/28/2023 FINAL  Final  Blood Culture ID Panel (Reflexed)     Status: Abnormal   Collection Time: 10/23/23 11:20 PM  Result Value Ref Range Status   Enterococcus faecalis NOT DETECTED NOT DETECTED Final   Enterococcus Faecium NOT DETECTED NOT DETECTED Final   Listeria monocytogenes NOT DETECTED NOT DETECTED Final   Staphylococcus species DETECTED (A) NOT DETECTED Final    Comment: CRITICAL RESULT CALLED TO, READ BACK BY AND VERIFIED WITH: PHARMD LYDIA CHEN [id number] AT 0837 BY EC    Staphylococcus aureus (BCID) NOT DETECTED NOT DETECTED Final  Staphylococcus epidermidis NOT DETECTED NOT DETECTED Final   Staphylococcus lugdunensis NOT DETECTED NOT DETECTED Final   Streptococcus species NOT DETECTED NOT DETECTED Final   Streptococcus  agalactiae NOT DETECTED NOT DETECTED Final   Streptococcus pneumoniae NOT DETECTED NOT DETECTED Final   Streptococcus pyogenes NOT DETECTED NOT DETECTED Final   A.calcoaceticus-baumannii NOT DETECTED NOT DETECTED Final   Bacteroides fragilis NOT DETECTED NOT DETECTED Final   Enterobacterales NOT DETECTED NOT DETECTED Final   Enterobacter cloacae complex NOT DETECTED NOT DETECTED Final   Escherichia coli NOT DETECTED NOT DETECTED Final   Klebsiella aerogenes NOT DETECTED NOT DETECTED Final   Klebsiella oxytoca NOT DETECTED NOT DETECTED Final   Klebsiella pneumoniae NOT DETECTED NOT DETECTED Final   Proteus species NOT DETECTED NOT DETECTED Final   Salmonella species NOT DETECTED NOT DETECTED Final   Serratia marcescens NOT DETECTED NOT DETECTED Final   Haemophilus influenzae NOT DETECTED NOT DETECTED Final   Neisseria meningitidis NOT DETECTED NOT DETECTED Final   Pseudomonas aeruginosa NOT DETECTED NOT DETECTED Final   Stenotrophomonas maltophilia NOT DETECTED NOT DETECTED Final   Candida albicans NOT DETECTED NOT DETECTED Final   Candida auris NOT DETECTED NOT DETECTED Final   Candida glabrata NOT DETECTED NOT DETECTED Final   Candida krusei NOT DETECTED NOT DETECTED Final   Candida parapsilosis NOT DETECTED NOT DETECTED Final   Candida tropicalis NOT DETECTED NOT DETECTED Final   Cryptococcus neoformans/gattii NOT DETECTED NOT DETECTED Final    Comment: Performed at [facility], [address] [place], [place] [address]  Culture, blood (Routine X 2) w Reflex to ID Panel     Status: None (Preliminary result)   Collection Time: 10/23/23 11:44 PM   Specimen: BLOOD RIGHT ARM  Result Value Ref Range Status   Specimen Description BLOOD RIGHT ARM  Final   Special Requests   Final    BOTTLES DRAWN AEROBIC AND ANAEROBIC Blood Culture adequate volume   Culture   Final    NO GROWTH 4 DAYS Performed at [facility], [address] [place], [place] [address]    Report Status  PENDING  Incomplete     Radiology Studies: No results found.  Nilda Fendt, MD, PhD [facility]  Between 7 am - 7 pm I am available, please contact me via Amion (for emergencies) or Securechat (non urgent messages)  Between 7 pm - 7 am I am not available, please contact night coverage MD/APP via Amion

## 2023-10-28 NOTE — Progress Notes (Signed)
 PHARMACY - ANTICOAGULATION CONSULT NOTE  Pharmacy Consult for Eliquis   Indication: atrial fibrillation  Allergies  Allergen Reactions   Other Hives and Itching    Wool fabric - hives, itching    Patient Measurements: Height: 6' 2 (188 cm) Weight: 65.5 kg (144 lb 6.4 oz) IBW/kg (Calculated) : 82.2  Vital Signs: Temp: 99.2 F (37.3 C) (10/27 0729) Temp Source: Oral (10/27 0729) BP: 131/77 (10/27 0729) Pulse Rate: 65 (10/27 0729)  Labs: Recent Labs    10/28/23 0503  HGB 8.0*  HCT 25.2*  PLT 220  CREATININE 1.33*    Estimated Creatinine Clearance: 47.9 mL/min (A) (by C-G formula based on SCr of 1.33 mg/dL (H)).   Medical History: Past Medical History:  Diagnosis Date   Acute urinary retention 01/04/2022   Altered mental status 08/18/2023   Anxiety    Arthritis    Cataract    CHF (congestive heart failure) (HCC)    Constipation    pt states goes EOD- hard stools    Coronary artery disease    Depression    Diabetes mellitus without complication (HCC)    Diabetic retinopathy (HCC)    DKA (diabetic ketoacidoses)    Hyperlipidemia    Hypertension    off meds   Hypertensive retinopathy    Lesion of ulnar nerve, left upper limb 07/12/2017   Non-intractable vomiting without nausea 06/10/2018   Postherpetic neuralgia 08/11/2021   Speculative diagnosis   Psoriasis    Tubular adenoma of colon 03/2019   UTI (urinary tract infection) 01/05/2022    Assessment: Patient admitted with L SDH, neurosurgery consulted on admission. Recommended to hold Eliquis  x72h and then resume (admitted 10/22, SDH seen on hCT from 10/22). On Eliquis  5mg  BID prior to admission.   HgB and PLTs from 10/26 stable.   Goal of Therapy:  Monitor platelets by anticoagulation protocol: Yes   Plan:  Resume home Eliquis  5mg  BID.  Monitor for s/sx of bleeding.   Powell Blush, PharmD, BCCCP  10/28/2023,10:01 AM

## 2023-10-28 NOTE — Plan of Care (Signed)
  Problem: Education: Goal: Ability to describe self-care measures that may prevent or decrease complications (Diabetes Survival Skills Education) will improve Outcome: Progressing Goal: Individualized Educational Video(s) Outcome: Progressing   Problem: Coping: Goal: Ability to adjust to condition or change in health will improve Outcome: Progressing   Problem: Health Behavior/Discharge Planning: Goal: Ability to identify and utilize available resources and services will improve Outcome: Progressing   Problem: Nutritional: Goal: Maintenance of adequate nutrition will improve Outcome: Progressing Goal: Progress toward achieving an optimal weight will improve Outcome: Progressing

## 2023-10-28 NOTE — Progress Notes (Signed)
 Occupational Therapy Treatment Patient Details Name: [name] [name] MRN: [id number] DOB: 09/13/1955 Today's Date: 10/28/2023   History of present illness Pt is a 68 y/o male presenting [facility] on [facility] after fall.  Imaging found L SDH, 4 mm shift.  Potential UTI. EEG negative. PMH includes: PAF, chronic bil PE, CHF, CAD s/p CABG, PAD, DM2, chronic decubitus ulcer.   OT comments  Pt agreeable to OOB with OT.  Needs mod to max assist for bed mobility, sitting EOB engaged in self feeing with close supervision, once finished with his pudding, patient reports need to lay back down and rest (declines further mobility or OOB).  No awareness to bowel incontinence during session. Confusion noted, as pt states this is my hospital room right? And when confirmed, pt able to state he was at [facility].  Pt voices no concern for dc home, voices he can make it work.  Will need further cognitive assessment. Also notified RN of coughing after eating.  Continue to recommend <3hrs/day inpatient setting at dc.   BP supine 113/58 BP EOB 98/53 BP supine 135/70  Asymptomatic       If plan is discharge home, recommend the following:  A little help with bathing/dressing/bathroom;A lot of help with walking and/or transfers;Direct supervision/assist for financial management;Direct supervision/assist for medications management;Assistance with cooking/housework;Assist for transportation;Help with stairs or ramp for entrance   Equipment Recommendations  BSC/3in1    Recommendations for Other Services      Precautions / Restrictions Precautions Precautions: Fall Recall of Precautions/Restrictions: Impaired Precaution/Restrictions Comments: watch BP, orthostatic Restrictions Weight Bearing Restrictions Per Provider Order: No       Mobility Bed Mobility Overal bed mobility: Needs Assistance Bed Mobility: Supine to Sit, Sit to Supine     Supine to sit: Max assist, Used rails, HOB elevated Sit to  supine: Mod assist   General bed mobility comments: pt requires assist to bring LEs fully towards EOB and support trunk, scoot forward. less assist to supine as pt controlled trunk to pillow.    Transfers                   General transfer comment: pt declined     Balance Overall balance assessment: Needs assistance Sitting-balance support: No upper extremity supported, Feet supported Sitting balance-Leahy Scale: Good                                     ADL either performed or assessed with clinical judgement   ADL Overall ADL's : Needs assistance/impaired Eating/Feeding: Set up;Sitting Eating/Feeding Details (indicate cue type and reason): eating pudding on tray table at EOB             Upper Body Dressing : Set up;Sitting         Toilet Transfer Details (indicate cue type and reason): deferred Toileting- Clothing Manipulation and Hygiene: Total assistance;Bed level Toileting - Clothing Manipulation Details (indicate cue type and reason): to complete hygiene, no awareness of incontience of bowel     Functional mobility during ADLs: Maximal assistance General ADL Comments: to EOB    Extremity/Trunk Assessment Upper Extremity Assessment Upper Extremity Assessment: Generalized weakness   Lower Extremity Assessment Lower Extremity Assessment: Defer to PT evaluation        Vision       Perception     Praxis     Communication Communication Communication: Impaired Factors Affecting Communication: Hearing  impaired   Cognition Arousal: Alert Behavior During Therapy: Flat affect Cognition: Cognition impaired     Awareness: Intellectual awareness intact Memory impairment (select all impairments): Short-term memory, Working memory Attention impairment (select first level of impairment): Sustained attention Executive functioning impairment (select all impairments): Initiation, Reasoning, Problem solving, Sequencing OT - Cognition  Comments: pt asking therapist this is my hospital room?, therapist providing answers but not questioning pt to builid rapport.  He is forgetful and requires repeated cues to engage in task.  He is unaware of incotience of bowel during session.                 Following commands: Impaired Following commands impaired: Follows one step commands with increased time      Cueing   Cueing Techniques: Verbal cues  Exercises      Shoulder Instructions       General Comments pt denies dizziness, but BP monitiored. Pt reports need to return back to bed to rest fairly quickly.  After eating pudding, noted pt coughing some and RN notified.    Pertinent Vitals/ Pain       Pain Assessment Pain Assessment: No/denies pain  Home Living                                          Prior Functioning/Environment              Frequency  Min 2X/week        Progress Toward Goals  OT Goals(current goals can now be found in the care plan section)  Progress towards OT goals: Progressing toward goals (slowly)  Acute Rehab OT Goals Patient Stated Goal: get home OT Goal Formulation: With patient Time For Goal Achievement: 11/07/23 Potential to Achieve Goals: Fair  Plan      Co-evaluation                 AM-PAC OT 6 Clicks Daily Activity     Outcome Measure   Help from another person eating meals?: A Little Help from another person taking care of personal grooming?: A Little Help from another person toileting, which includes using toliet, bedpan, or urinal?: Total Help from another person bathing (including washing, rinsing, drying)?: A Lot Help from another person to put on and taking off regular upper body clothing?: A Little Help from another person to put on and taking off regular lower body clothing?: A Lot 6 Click Score: 14    End of Session    OT Visit Diagnosis: Other abnormalities of gait and mobility (R26.89);Muscle weakness (generalized)  (M62.81);Other symptoms and signs involving cognitive function   Activity Tolerance Patient tolerated treatment well   Patient Left in bed;with call bell/phone within reach;with bed alarm set   Nurse Communication Mobility status;Precautions        Time: 8761-8695 OT Time Calculation (min): 26 min  Charges: OT General Charges $OT Visit: 1 Visit OT Treatments $Self Care/Home Management : 23-37 mins  Etta NOVAK, OT Acute Rehabilitation Services Office [phone] Secure Chat Preferred    Etta GORMAN Hope 10/28/2023, 2:01 PM

## 2023-10-28 NOTE — Progress Notes (Signed)
 OT Cancellation Note  Patient Details Name: [name] [name] MRN: [id number] DOB: 1955-12-17   Cancelled Treatment:    Reason Eval/Treat Not Completed: Patient declined, no reason specified- pt declined, not wanting to mobilize OOB at this time.  Educated on importance of OOB and OT participation as pt is hopeful to dc home, but pt continues to decline. Will follow and see as able.   Etta NOVAK, OT Acute Rehabilitation Services Office [phone] Secure Chat Preferred    Etta GORMAN Hope 10/28/2023, 9:34 AM

## 2023-10-29 DIAGNOSIS — S065XAA Traumatic subdural hemorrhage with loss of consciousness status unknown, initial encounter: Secondary | ICD-10-CM | POA: Diagnosis not present

## 2023-10-29 LAB — CBC
HCT: 27.8 % — ABNORMAL LOW (ref 39.0–52.0)
Hemoglobin: 8.9 g/dL — ABNORMAL LOW (ref 13.0–17.0)
MCH: 27.4 pg (ref 26.0–34.0)
MCHC: 32 g/dL (ref 30.0–36.0)
MCV: 85.5 fL (ref 80.0–100.0)
Platelets: 252 K/uL (ref 150–400)
RBC: 3.25 MIL/uL — ABNORMAL LOW (ref 4.22–5.81)
RDW: 15.8 % — ABNORMAL HIGH (ref 11.5–15.5)
WBC: 7.1 K/uL (ref 4.0–10.5)
nRBC: 0 % (ref 0.0–0.2)

## 2023-10-29 LAB — CULTURE, BLOOD (ROUTINE X 2)
Culture: NO GROWTH
Special Requests: ADEQUATE

## 2023-10-29 LAB — BASIC METABOLIC PANEL WITH GFR
Anion gap: 8 (ref 5–15)
BUN: 51 mg/dL — ABNORMAL HIGH (ref 8–23)
CO2: 22 mmol/L (ref 22–32)
Calcium: 7.9 mg/dL — ABNORMAL LOW (ref 8.9–10.3)
Chloride: 100 mmol/L (ref 98–111)
Creatinine, Ser: 1.41 mg/dL — ABNORMAL HIGH (ref 0.61–1.24)
GFR, Estimated: 54 mL/min — ABNORMAL LOW (ref >60)
Glucose, Bld: 318 mg/dL — ABNORMAL HIGH (ref 70–99)
Potassium: 5 mmol/L (ref 3.5–5.1)
Sodium: 130 mmol/L — ABNORMAL LOW (ref 135–145)

## 2023-10-29 LAB — GLUCOSE, CAPILLARY
Glucose-Capillary: 279 mg/dL — ABNORMAL HIGH (ref 70–99)
Glucose-Capillary: 244 mg/dL — ABNORMAL HIGH (ref 70–99)
Glucose-Capillary: 128 mg/dL — ABNORMAL HIGH (ref 70–99)
Glucose-Capillary: 114 mg/dL — ABNORMAL HIGH (ref 70–99)
Glucose-Capillary: 88 mg/dL (ref 70–99)

## 2023-10-29 LAB — MAGNESIUM: Magnesium: 2.1 mg/dL (ref 1.7–2.4)

## 2023-10-29 MED ORDER — SODIUM CHLORIDE 0.9 % IV SOLN: INTRAVENOUS | Status: DC

## 2023-10-29 MED ORDER — INSULIN GLARGINE-YFGN 100 UNIT/ML ~~LOC~~ SOLN
8.0000 [IU] | Freq: Every day | SUBCUTANEOUS | Status: DC
  Administered 2023-10-29 – 2023-11-06 (×9): 8 [IU] via SUBCUTANEOUS
  Filled 2023-10-29 (×9): qty 0.08

## 2023-10-29 NOTE — Progress Notes (Signed)
 SLP Cancellation Note  Patient Details Name: Makoto Sellitto MRN: [id number] DOB: October 14, 1955   Cancelled treatment:       Reason Eval/Treat Not Completed: Fatigue/lethargy limiting ability to participate. SLP will f/u as able.    Damien Blumenthal, M.A., CCC-SLP Speech Language Pathology, Acute Rehabilitation Services  Secure Chat preferred [phone]  10/29/2023, 4:25 PM

## 2023-10-29 NOTE — Inpatient Diabetes Management (Signed)
 Inpatient Diabetes Program Recommendations  AACE/ADA: New Consensus Statement on Inpatient Glycemic Control (2015)  Target Ranges:  Prepandial:   less than 140 mg/dL      Peak postprandial:   less than 180 mg/dL (1-2 hours)      Critically ill patients:  140 - 180 mg/dL   Lab Results  Component Value Date   GLUCAP 244 (H) 10/29/2023   HGBA1C 9.1 (H) 07/26/2023    Review of Glycemic Control  Latest Reference Range & Units 10/28/23 08:29 10/28/23 11:48 10/28/23 16:44 10/28/23 21:07 10/29/23 09:23 10/29/23 11:39  Glucose-Capillary 70 - 99 mg/dL 750 (H) 793 (H) 752 (H) 257 (H) 279 (H) 244 (H)   Diabetes history: DM  Outpatient Diabetes medications: Glipizide  5 mg Daily, 2.5 mg qpm, Januvia  100 mg Daily Current orders for Inpatient glycemic control:  Novolog  0-20 units tid  Ensure Plus High Protein (19 grams of carbs) bid between meals  Inpatient Diabetes Program Recommendations:    -   start Semglee  8 units -   Add Novolog  hs scale  May need meal coverage if supplements continues.  Thanks,  Clotilda Bull RN, MSN, BC-ADM Inpatient Diabetes Coordinator Team Pager [phone] (8a-5p)

## 2023-10-29 NOTE — Progress Notes (Signed)
 PROGRESS NOTE  [name] [name] FMW:[id number] DOB: Apr 22, 1955 DOA: 10/23/2023 PCP: [name], Mardy HERO, PA-C   LOS: 6 days   Brief Narrative / Interim history: 68 year old male with history of PAF, chronic bilateral pleural effusions, diastolic CHF, CAD with history of CABG, PAD, DM2, chronic decubitus ulcer comes into the hospital with an episode of unresponsiveness, he was found down, found to have acute left subdural hematoma.  Patient has no recollection as to what happened, but tells me that over the last 3 to 4 days he has been feeling sick to his stomach, has been having nausea and vomiting and feels very weak.  Subjective / 24h Interval events: Doing well, no chest pain, no shortness of breath  Assesement and Plan: Principal problem Left SDH-4 mm, no midline shift on CT head.  Neurosurgery consulted, appreciate input, given stability there are no surgical recommendations at this point.  Neurosurgery recommends holding Eliquis  for 72 hours and restarted as normal afterwards.  Eliquis  resumed 10/27, seems to be tolerating  Active problems Possible UTI -patient without significant urinary complaints however did have nausea, vomiting, diarrhea and overall weakness and abdominal discomfort at home.  Favor treating given pyuria and bacteriuria, completed treatment with 5 days of antibiotics  Syncope-EEG negative for seizures, 2D echocardiogram done 10/23 shows LVEF 60-65%, no WMA, RV systolic function and size were normal  AKI-likely in the setting of poor p.o. intake, nausea, vomiting.  Creatinine dialyzed  Essential hypertension-continue home losartan , increase dose to 100, amlodipine  was added, blood pressure finally improved  Hypomagnesemia-replenish magnesium   AKI on CKD 3A-baseline creatinine around 1.1, creatinine at baseline initially but increasing in the last couple of days, up to 1.4.  BUN increasing as well, suggesting poor p.o. intake/poor fluid intake - IV fluids  today  Intermittent confabulations -patient does answer orientation questions appropriately, and seems to have a decent grasp on his current medical issues however, on Sunday, when his son was visiting the patient started telling me that he left the room was transported into some form of rehab building adjacent to the hospital where they had a bunch of basketball hoops. On Friday, Saturday and Sunday patient gave me 3 different versions of how he fell at home, and per son none of them are true.  - Given his malnutrition, cachexia, obtain B1 level and start high-dose B1, today day 2/3 - Replenish B12 as well since it is low normal - SLP consult for cognitive eval  Severe protein calorie malnutrition -RD consult, continue Remeron   Hyperlipidemia-continue statin  Social-overall very difficult social situation.  Patient is alert enough that he does not want any placement, but now more agreeable to SNF.  P2P a was offered to me on Friday, however at that time patient was very adamant about not going to any SNF and I did not call to discussed with the insurance MD.  We do not have any formal paperwork to suggest they declined SNF coverage, discussed with TOC and she will call the insurance company,   Scheduled Meds:  amLODipine   10 mg Oral Daily   apixaban   5 mg Oral BID   atorvastatin   40 mg Oral Daily   vitamin B-12  1,000 mcg Oral Daily   feeding supplement  237 mL Oral BID BM   insulin  aspart  0-20 Units Subcutaneous TID WC   losartan   100 mg Oral Daily   mirtazapine   15 mg Oral QHS   PARoxetine   30 mg Oral Daily   Continuous Infusions:  thiamine  (VITAMIN B1) injection 500 mg (10/29/23 0602)   PRN Meds:.  Current Outpatient Medications  Medication Instructions   acetaminophen  (TYLENOL ) 500 mg, As needed   atorvastatin  (LIPITOR) 40 mg, Oral, Daily   Eliquis  5 mg, Oral, 2 times daily   furosemide  (LASIX ) 20 MG tablet 20-40 tablets, Daily PRN   glipiZIDE  (GLUCOTROL ) 5 mg, Oral, 2 times  daily before meals   Loperamide  HCl (IMODIUM  PO) 4 mg, Oral, Every 4 hours PRN   loratadine  (CLARITIN ) 10 mg, Daily PRN   losartan  (COZAAR ) 50 mg, Oral, Daily   mirtazapine  (REMERON ) 15 MG tablet TAKE 1 TABLET BY MOUTH EVERYDAY AT BEDTIME   OVER THE COUNTER MEDICATION 500 mg, Oral, Daily, Opitmal Carnivore Organ Complex    PARoxetine  (PAXIL ) 20 mg, Oral, Daily   QUEtiapine  (SEROQUEL ) 25 mg, Oral, Daily at bedtime   sitaGLIPtin  (JANUVIA ) 100 mg, Oral, Daily   spironolactone  (ALDACTONE ) 25 mg, Oral, Daily   Vitamin D3 4,000 Units, Daily    Diet Orders (From admission, onward)     Start     Ordered   10/23/23 1538  Diet Carb Modified Fluid consistency: Thin; Room service appropriate? Yes  Diet effective now       Question Answer Comment  Diet-HS Snack? Nothing   Calorie Level Medium 1600-2000   Fluid consistency: Thin   Room service appropriate? Yes      10/23/23 1537            DVT prophylaxis: SCDs Start: 10/23/23 1536 apixaban  (ELIQUIS ) tablet 5 mg   Lab Results  Component Value Date   PLT 252 10/29/2023      Code Status: Full Code  Family Communication: No family at bedside  Status is: Inpatient Remains inpatient appropriate because: Severity of illness   Level of care: Telemetry Medical  Consultants:  Neurosurgery  Objective: Vitals:   10/28/23 1645 10/28/23 1940 10/28/23 2300 10/29/23 0309  BP: 114/61 112/62 135/68 (!) 152/75  Pulse: 63 63 63 60  Resp: 16 16 16 16   Temp: 98.7 F (37.1 C) 99.1 F (37.3 C) 97.6 F (36.4 C) 98 F (36.7 C)  TempSrc: Oral Oral Oral Oral  SpO2: 97% 95% 97% 97%  Weight:      Height:        Intake/Output Summary (Last 24 hours) at 10/29/2023 1048 Last data filed at 10/29/2023 0657 Gross per 24 hour  Intake 451.45 ml  Output 1150 ml  Net -698.55 ml   Wt Readings from Last 3 Encounters:  10/24/23 65.5 kg  09/30/23 66.2 kg  09/26/23 66.7 kg    Examination:  Constitutional: NAD Eyes: lids and conjunctivae  normal, no scleral icterus ENMT: mmm Neck: normal, supple Respiratory: clear to auscultation bilaterally, no wheezing, no crackles.  Cardiovascular: Regular rate and rhythm, no murmurs / rubs / gallops.  Abdomen: soft, no distention, no tenderness. Bowel sounds positive.   Data Reviewed: I have independently reviewed following labs and imaging studies   CBC Recent Labs  Lab 10/23/23 0933 10/23/23 0939 10/23/23 1011 10/28/23 0503 10/29/23 0418  WBC 10.8*  --   --  6.9 7.1  HGB 9.3* 10.2* 9.9* 8.0* 8.9*  HCT 29.2* 30.0* 29.0* 25.2* 27.8*  PLT 239  --   --  220 252  MCV 86.4  --   --  85.7 85.5  MCH 27.5  --   --  27.2 27.4  MCHC 31.8  --   --  31.7 32.0  RDW 15.6*  --   --  15.7* 15.8*  LYMPHSABS 0.6*  --   --   --   --   MONOABS 0.7  --   --   --   --   EOSABS 0.0  --   --   --   --   BASOSABS 0.0  --   --   --   --     Recent Labs  Lab 10/23/23 0933 10/23/23 0939 10/23/23 1011 10/24/23 0516 10/28/23 0503 10/29/23 0418  NA 132* 135 135 137 131* 130*  K 5.0 5.0 5.1 4.6 4.6 5.0  CL 103 102  --  106 101 100  CO2 19*  --   --  21* 22 22  GLUCOSE 277* 279*  --  203* 287* 318*  BUN 50* 56*  --  47* 43* 51*  CREATININE 1.19 1.10  --  1.06 1.33* 1.41*  CALCIUM  8.5*  --   --  8.4* 7.8* 7.9*  AST 22  --   --   --  19  --   ALT 16  --   --   --  14  --   ALKPHOS 179*  --   --   --  138*  --   BILITOT 0.8  --   --   --  0.4  --   ALBUMIN 2.6*  --   --   --  1.8*  --   MG 1.7  --   --   --  1.6* 2.1  INR 1.8*  --   --   --   --   --   TSH  --   --   --   --  1.904  --     ------------------------------------------------------------------------------------------------------------------ No results for input(s): CHOL, HDL, LDLCALC, TRIG, CHOLHDL, LDLDIRECT in the last 72 hours.  Lab Results  Component Value Date   HGBA1C 9.1 (H) 07/26/2023    ------------------------------------------------------------------------------------------------------------------ Recent Labs    10/28/23 0503  TSH 1.904    Cardiac Enzymes No results for input(s): CKMB, TROPONINI, MYOGLOBIN in the last 168 hours.  Invalid input(s): CK ------------------------------------------------------------------------------------------------------------------    Component Value Date/Time   BNP 184.1 (H) 09/12/2023 1200    CBG: Recent Labs  Lab 10/28/23 0829 10/28/23 1148 10/28/23 1644 10/28/23 2107 10/29/23 0923  GLUCAP 249* 206* 247* 257* 279*    Recent Results (from the past 240 hours)  Culture, blood (Routine X 2) w Reflex to ID Panel     Status: Abnormal   Collection Time: 10/23/23 11:20 PM   Specimen: BLOOD  Result Value Ref Range Status   Specimen Description BLOOD RIGHT ANTECUBITAL  Final   Special Requests   Final    BOTTLES DRAWN AEROBIC AND ANAEROBIC Blood Culture results may not be optimal due to an inadequate volume of blood received in culture bottles   Culture  Setup Time   Final    GRAM POSITIVE COCCI AEROBIC BOTTLE ONLY CRITICAL RESULT CALLED TO, READ BACK BY AND VERIFIED WITH: PHARMD LYDIA CHEN [id number] AT 0837 BY EC    Culture (A)  Final    STAPHYLOCOCCUS AURICULARIS THE SIGNIFICANCE OF ISOLATING THIS ORGANISM FROM A SINGLE SET OF BLOOD CULTURES WHEN MULTIPLE SETS ARE DRAWN IS UNCERTAIN. PLEASE NOTIFY THE MICROBIOLOGY DEPARTMENT WITHIN ONE WEEK IF SPECIATION AND SENSITIVITIES ARE REQUIRED. Performed at [facility], [address], [place], [place] [address]    Report Status 10/28/2023 FINAL  Final  Blood Culture ID Panel (Reflexed)     Status: Abnormal  Collection Time: 10/23/23 11:20 PM  Result Value Ref Range Status   Enterococcus faecalis NOT DETECTED NOT DETECTED Final   Enterococcus Faecium NOT DETECTED NOT DETECTED Final   Listeria monocytogenes NOT DETECTED NOT DETECTED Final   Staphylococcus  species DETECTED (A) NOT DETECTED Final    Comment: CRITICAL RESULT CALLED TO, READ BACK BY AND VERIFIED WITH: PHARMD LYDIA CHEN [id number] AT 0837 BY EC    Staphylococcus aureus (BCID) NOT DETECTED NOT DETECTED Final   Staphylococcus epidermidis NOT DETECTED NOT DETECTED Final   Staphylococcus lugdunensis NOT DETECTED NOT DETECTED Final   Streptococcus species NOT DETECTED NOT DETECTED Final   Streptococcus agalactiae NOT DETECTED NOT DETECTED Final   Streptococcus pneumoniae NOT DETECTED NOT DETECTED Final   Streptococcus pyogenes NOT DETECTED NOT DETECTED Final   A.calcoaceticus-baumannii NOT DETECTED NOT DETECTED Final   Bacteroides fragilis NOT DETECTED NOT DETECTED Final   Enterobacterales NOT DETECTED NOT DETECTED Final   Enterobacter cloacae complex NOT DETECTED NOT DETECTED Final   Escherichia coli NOT DETECTED NOT DETECTED Final   Klebsiella aerogenes NOT DETECTED NOT DETECTED Final   Klebsiella oxytoca NOT DETECTED NOT DETECTED Final   Klebsiella pneumoniae NOT DETECTED NOT DETECTED Final   Proteus species NOT DETECTED NOT DETECTED Final   Salmonella species NOT DETECTED NOT DETECTED Final   Serratia marcescens NOT DETECTED NOT DETECTED Final   Haemophilus influenzae NOT DETECTED NOT DETECTED Final   Neisseria meningitidis NOT DETECTED NOT DETECTED Final   Pseudomonas aeruginosa NOT DETECTED NOT DETECTED Final   Stenotrophomonas maltophilia NOT DETECTED NOT DETECTED Final   Candida albicans NOT DETECTED NOT DETECTED Final   Candida auris NOT DETECTED NOT DETECTED Final   Candida glabrata NOT DETECTED NOT DETECTED Final   Candida krusei NOT DETECTED NOT DETECTED Final   Candida parapsilosis NOT DETECTED NOT DETECTED Final   Candida tropicalis NOT DETECTED NOT DETECTED Final   Cryptococcus neoformans/gattii NOT DETECTED NOT DETECTED Final    Comment: Performed at [facility], [address], [place], [place] [address]  Culture, blood (Routine X 2) w Reflex to ID Panel      Status: None   Collection Time: 10/23/23 11:44 PM   Specimen: BLOOD RIGHT ARM  Result Value Ref Range Status   Specimen Description BLOOD RIGHT ARM  Final   Special Requests   Final    BOTTLES DRAWN AEROBIC AND ANAEROBIC Blood Culture adequate volume   Culture   Final    NO GROWTH 5 DAYS Performed at [facility], [address], [place], [place] [address]    Report Status 10/29/2023 FINAL  Final     Radiology Studies: No results found.  Nilda Fendt, MD, PhD [facility]  Between 7 am - 7 pm I am available, please contact me via Amion (for emergencies) or Securechat (non urgent messages)  Between 7 pm - 7 am I am not available, please contact night coverage MD/APP via Amion

## 2023-10-29 NOTE — Progress Notes (Signed)
 Physical Therapy Treatment Patient Details Name: [name] [name] MRN: [id number] DOB: December 26, 1955 Today's Date: 10/29/2023   History of Present Illness Pt is a 68 y/o male presenting to [facility] on 10/22 after fall.  Imaging found L SDH, 4 mm shift.  Potential UTI. EEG negative. PMH includes: PAF, chronic bil PE, CHF, CAD s/p CABG, PAD, DM2, chronic decubitus ulcer.    PT Comments  Patient sleeping on arrival, easily awakened however remained lethargic during session. Agreeable to work with therapies and able to progress to OOB to chair. Pt with difficulty managing RW as step-pivoting to chair and further gait deferred. BP actually went up with activity today (had previously been orthostatic). Patient will benefit from continued inpatient follow up therapy, <3 hours/day.     If plan is discharge home, recommend the following: Two people to help with walking and/or transfers;Assistance with cooking/housework;Assist for transportation;Help with stairs or ramp for entrance   Can travel by private vehicle     No  Equipment Recommendations  None recommended by PT    Recommendations for Other Services       Precautions / Restrictions Precautions Precautions: Fall Recall of Precautions/Restrictions: Impaired Precaution/Restrictions Comments: watch BP, orthostatic Restrictions Weight Bearing Restrictions Per Provider Order: No     Mobility  Bed Mobility Overal bed mobility: Needs Assistance Bed Mobility: Rolling, Sidelying to Sit Rolling: Max assist, +2 for physical assistance Sidelying to sit: Mod assist, +2 for physical assistance, HOB elevated       General bed mobility comments: pt sleeping on arrival; agreed to OOB however would not initiate movement; incr assist for rolling and side to sit    Transfers Overall transfer level: Needs assistance Equipment used: Rolling walker (2 wheels) Transfers: Sit to/from Stand, Bed to chair/wheelchair/BSC Sit to Stand: Min assist, +2  safety/equipment, From elevated surface   Step pivot transfers: Min assist, +2 physical assistance       General transfer comment: Stood from EOB twice (incontinent initially; second time pt cleaned and able to step-pivot to chair)    Ambulation/Gait               General Gait Details: pt too lethargic   Stairs             Wheelchair Mobility     Tilt Bed    Modified Rankin (Stroke Patients Only)       Balance Overall balance assessment: Needs assistance Sitting-balance support: No upper extremity supported, Feet supported Sitting balance-Leahy Scale: Good     Standing balance support: During functional activity, Reliant on assistive device for balance, Bilateral upper extremity supported Standing balance-Leahy Scale: Poor                              Communication Communication Communication: Impaired Factors Affecting Communication: Hearing impaired  Cognition Arousal: Lethargic Behavior During Therapy: Flat affect                           PT - Cognition Comments: Easily drifting off to sleep/closing eyes Following commands: Impaired Following commands impaired: Follows one step commands with increased time    Cueing Cueing Techniques: Verbal cues  Exercises      General Comments General comments (skin integrity, edema, etc.): denied dizziness; BP sitting 109/65, after transfer 123/62.      Pertinent Vitals/Pain Pain Assessment Pain Assessment: No/denies pain    Home Living  Prior Function            PT Goals (current goals can now be found in the care plan section) Acute Rehab PT Goals Patient Stated Goal: none stated Time For Goal Achievement: 11/07/23 Potential to Achieve Goals: Good Progress towards PT goals: Progressing toward goals    Frequency    Min 2X/week      PT Plan      Co-evaluation              AM-PAC PT 6 Clicks Mobility   Outcome  Measure  Help needed turning from your back to your side while in a flat bed without using bedrails?: Total Help needed moving from lying on your back to sitting on the side of a flat bed without using bedrails?: A Lot Help needed moving to and from a bed to a chair (including a wheelchair)?: Total Help needed standing up from a chair using your arms (e.g., wheelchair or bedside chair)?: Total Help needed to walk in hospital room?: Total Help needed climbing 3-5 steps with a railing? : Total 6 Click Score: 7    End of Session Equipment Utilized During Treatment: Gait belt Activity Tolerance: Patient limited by lethargy Patient left: in chair;with chair alarm set;with call bell/phone within reach (HOB elevated 40 degrees) Nurse Communication: Mobility status PT Visit Diagnosis: Unsteadiness on feet (R26.81);Other abnormalities of gait and mobility (R26.89);Muscle weakness (generalized) (M62.81);History of falling (Z91.81);Dizziness and giddiness (R42)     Time: 8664-8645 PT Time Calculation (min) (ACUTE ONLY): 19 min  Charges:    $Therapeutic Activity: 8-22 mins PT General Charges $$ ACUTE PT VISIT: 1 Visit                      Macario RAMAN, PT Acute Rehabilitation Services  Office [phone]    Macario SHAUNNA Soja 10/29/2023, 3:01 PM

## 2023-10-29 NOTE — Progress Notes (Incomplete)
 Initial Nutrition Assessment  DOCUMENTATION CODES:   Severe malnutrition in context of chronic illness  INTERVENTION:  Encourage PO intake - On carb modified diet, thin liquids Room service with assist  Consider liberalizing diet if po intake declines Ensure Plus High Protein po BID, each supplement provides 350 kcal and 20 grams of protein Continue Thiamine  and B12 supplementation Add MVI with minerals daily   NUTRITION DIAGNOSIS:   Severe Malnutrition related to chronic illness as evidenced by severe muscle depletion, severe fat depletion.  GOAL:   Patient will meet greater than or equal to 90% of their needs, Weight gain  MONITOR:   PO intake, Supplement acceptance, Labs, Weight trends  REASON FOR ASSESSMENT:   Consult Assessment of nutrition requirement/status  ASSESSMENT:   68 y/o male presenting to [facility] on 10/22 after fall. Imaging found L SDH, 4 mm shift. Potential UTI. EEG negative. PMH includes: PAF, chronic bil PE, CHF, CAD s/p CABG, PAD, DM2, HLD, HTN.  Per chart review, Pt has fluctuating AMS and noted he seems appropriate to answer questions but a lot of time his stories are not real. Pt is a poor historian at times.   Pt resting in bed on assessment, awake and alert, appears cachectic, no family bedside, breakfast tray at bedside with 100% completed. Pt endorses having an appetite, no N/V or abdominal pain. Drinking an Ensure during assessment. Per RN pt still hungry and she ordered another breakfast tray. Eating 50-100% of his meals and drinking at least 1 Ensure per day.   Pt reports he does not live with his son rather they live close by to each other. Pt endorses being able to do his ADL independently with his son providing some meals for him, pt does make his own meals or orders take out. Walks with a rollator at home.   Pt states he have fluid fluctuations with his weight, UBW of 140-150 lbs. Endorses no recent weight loss of loss of appetite/PO intake.  However it is noted pt had 3-4 days of nausea,vomiting, diarrhea, and abdominal pain PTA. Likely intake was poor during this time however pt not able to elaborate. Pt reports weight of 160 lbs in beginning of August was from fluid. Weight appears stable since August however limited weight history available in months prior. Cannot confirm weight loss at this time.   Pt states eating well at home, eating 3 meals per day and snacking in between, however when pt provided dietary recall, pt is not eating enough to meet his nutritional needs. Pt with severe muscle and fat wasting. Pt is severely malnourished, suspect from his chronic issues however his social situation may also be playing a role. Likely pt will continue to decline at home, plan for SNF? MD started on high dose thiamine  and Remeron . Has been refusing working with PT occasionally.   Dietary recall PTA: Breakfast - Oatmeal with berries Lunch - Deli sandwich  Dinner - Fish with potatoes and vegetable Dinks - Gingerale, water Snacks - Fruit *Of note pt does reports he does not eat beef or chicken  Dispo: SNF  Admit weight: 65.5 kg Current weight: 65.5 kg   Wt Readings from Last 10 Encounters:  10/24/23 65.5 kg  09/30/23 66.2 kg  09/26/23 66.7 kg  09/17/23 66.7 kg  09/12/23 63 kg  08/30/23 63 kg  08/29/23 67 kg  08/26/23 67.5 kg  08/22/23 63.2 kg  08/08/23 73 kg    Average Meal Intake: 10/24-10/29: 68% intake x 8 recorded meals  Nutritionally Relevant Medications: Scheduled Meds:  vitamin B-12  1,000 mcg Oral Daily   feeding supplement  237 mL Oral BID BM   insulin  aspart  0-20 Units Subcutaneous TID WC   insulin  glargine-yfgn  8 Units Subcutaneous Daily   mirtazapine   15 mg Oral QHS   multivitamin with minerals  1 tablet Oral Daily   Continuous Infusions:  sodium chloride  100 mL/hr at 10/30/23 0122   thiamine  (VITAMIN B1) injection 500 mg (10/30/23 0526)   Labs Reviewed: BUN 39 AP 128 CBG ranges from 88-279  mg/dL over the last 24 hours HgbA1c 9.1  NUTRITION - FOCUSED PHYSICAL EXAM:  Flowsheet Row Most Recent Value  Orbital Region Moderate depletion  Upper Arm Region Severe depletion  Thoracic and Lumbar Region Severe depletion  Buccal Region Moderate depletion  Temple Region Moderate depletion  Clavicle Bone Region Severe depletion  Clavicle and Acromion Bone Region Severe depletion  Scapular Bone Region Severe depletion  Dorsal Hand Severe depletion  Patellar Region Severe depletion  Anterior Thigh Region Severe depletion  Posterior Calf Region Severe depletion  Edema (RD Assessment) None  Hair Reviewed  Eyes Reviewed  Mouth Reviewed  Skin Reviewed  Nails Reviewed    Diet Order:   Diet Order             Diet Carb Modified Fluid consistency: Thin; Room service appropriate? Yes with Assist  Diet effective now                   EDUCATION NEEDS:   Not appropriate for education at this time  Skin:  Skin Assessment: Reviewed RN Assessment  Last BM:  10/28. type 6  Height:   Ht Readings from Last 1 Encounters:  10/24/23 6' 2 (1.88 m)    Weight:   Wt Readings from Last 1 Encounters:  10/24/23 65.5 kg    Ideal Body Weight:  86.4 kg  BMI:  Body mass index is 18.54 kg/m.  Estimated Nutritional Needs:   Kcal:  1900-2100 kcal  Protein:  80-100 gm  Fluid:  >1.9L/day   Olivia Kenning, RD Registered Dietitian  See Amion for more information

## 2023-10-29 NOTE — TOC Progression Note (Signed)
 Transition of Care [facility]) - Progression Note    Patient Details  Name: [name] [name] MRN: [id number] Date of Birth: July 01, 1955  Transition of Care [facility]) CM/SW Contact  [name], [place] Phone Number: 10/29/2023, 4:32 PM  Clinical Narrative:      10:45 am - Dr Trixie- informed writer that he did not complete P2P because the patient was adamant about returning home.   10:57 am - contacted [employer] insurance - requested the fax denial letter to [facility] office - needed the denial information to provide to family.  11:30 am CSW updated APS SW Carmella- patient still here at the hospital   2:55 pm- received call back form patient' son, [name]- informed insurance denied for SNF.  He states he is going to request Fast Track Appeal - CSW provided appeal # [id number]. CSW requested to update CSW once he has requested an appeal. Family does not believe the patient is safe to be home alone.  CSW sent email to Financial Counselor Saprese - requested to medicaid process.   TOC will continue to follow and assist with discharge planning.   Montie Louder, MSW, LCSW Clinical Social Worker        Expected Discharge Plan: Skilled Nursing Facility Barriers to Discharge: English As A Second Language Teacher, Continued Medical Work up, SNF Pending bed offer               Expected Discharge Plan and Services In-house Referral: Clinical Social Work                                             Social Drivers of Health (SDOH) Interventions SDOH Screenings   Food Insecurity: No Food Insecurity (10/23/2023)  Recent Concern: Food Insecurity - Food Insecurity Present (08/18/2023)  Housing: Low Risk  (10/23/2023)  Transportation Needs: No Transportation Needs (10/23/2023)  Utilities: Not At Risk (10/23/2023)  Alcohol Screen: Low Risk  (08/19/2023)  Depression (PHQ2-9): Medium Risk (07/26/2023)  Financial Resource Strain: Low Risk  (08/19/2023)  Physical Activity:  Inactive (11/21/2022)  Social Connections: Socially Isolated (10/23/2023)  Stress: Stress Concern Present (11/21/2022)  Tobacco Use: High Risk (10/24/2023)  Health Literacy: Adequate Health Literacy (11/21/2022)    Readmission Risk Interventions    08/20/2023    4:20 PM  Readmission Risk Prevention Plan  Transportation Screening Complete  PCP or Specialist Appt within 3-5 Days Complete  HRI or Home Care Consult Complete  Palliative Care Screening Not Applicable  Medication Review (RN Care Manager) Complete

## 2023-10-30 DIAGNOSIS — S065XAA Traumatic subdural hemorrhage with loss of consciousness status unknown, initial encounter: Secondary | ICD-10-CM | POA: Diagnosis not present

## 2023-10-30 DIAGNOSIS — N390 Urinary tract infection, site not specified: Secondary | ICD-10-CM | POA: Insufficient documentation

## 2023-10-30 LAB — COMPREHENSIVE METABOLIC PANEL WITH GFR
ALT: 15 U/L (ref 0–44)
AST: 19 U/L (ref 15–41)
Albumin: 2 g/dL — ABNORMAL LOW (ref 3.5–5.0)
Alkaline Phosphatase: 128 U/L — ABNORMAL HIGH (ref 38–126)
Anion gap: 9 (ref 5–15)
BUN: 39 mg/dL — ABNORMAL HIGH (ref 8–23)
CO2: 21 mmol/L — ABNORMAL LOW (ref 22–32)
Calcium: 8.4 mg/dL — ABNORMAL LOW (ref 8.9–10.3)
Chloride: 105 mmol/L (ref 98–111)
Creatinine, Ser: 0.9 mg/dL (ref 0.61–1.24)
GFR, Estimated: >60 mL/min (ref >60)
Glucose, Bld: 105 mg/dL — ABNORMAL HIGH (ref 70–99)
Potassium: 5 mmol/L (ref 3.5–5.1)
Sodium: 135 mmol/L (ref 135–145)
Total Bilirubin: 0.3 mg/dL (ref 0.0–1.2)
Total Protein: 6 g/dL — ABNORMAL LOW (ref 6.5–8.1)

## 2023-10-30 LAB — CBC
HCT: 29.3 % — ABNORMAL LOW (ref 39.0–52.0)
Hemoglobin: 9.3 g/dL — ABNORMAL LOW (ref 13.0–17.0)
MCH: 27.3 pg (ref 26.0–34.0)
MCHC: 31.7 g/dL (ref 30.0–36.0)
MCV: 85.9 fL (ref 80.0–100.0)
Platelets: 240 K/uL (ref 150–400)
RBC: 3.41 MIL/uL — ABNORMAL LOW (ref 4.22–5.81)
RDW: 15.9 % — ABNORMAL HIGH (ref 11.5–15.5)
WBC: 7 K/uL (ref 4.0–10.5)
nRBC: 0 % (ref 0.0–0.2)

## 2023-10-30 LAB — GLUCOSE, CAPILLARY
Glucose-Capillary: 119 mg/dL — ABNORMAL HIGH (ref 70–99)
Glucose-Capillary: 203 mg/dL — ABNORMAL HIGH (ref 70–99)
Glucose-Capillary: 219 mg/dL — ABNORMAL HIGH (ref 70–99)

## 2023-10-30 LAB — MAGNESIUM: Magnesium: 1.9 mg/dL (ref 1.7–2.4)

## 2023-10-30 MED ORDER — ADULT MULTIVITAMIN W/MINERALS CH
1.0000 | ORAL_TABLET | Freq: Every day | ORAL | Status: DC
  Administered 2023-10-30 – 2023-11-06 (×8): 1 via ORAL
  Filled 2023-10-30 (×8): qty 1

## 2023-10-30 NOTE — Evaluation (Signed)
 Speech Language Pathology Evaluation Patient Details Name: [name] [name] MRN: [id number] DOB: 05/01/1955 Today's Date: 10/30/2023 Time: 8871-8856 SLP Time Calculation (min) (ACUTE ONLY): 15 min  Problem List:  Patient Active Problem List   Diagnosis Date Noted   Hypomagnesemia 10/30/2023   Possible UTI 10/30/2023   Subdural hematoma (HCC) 10/23/2023   AKI (acute kidney injury) 09/12/2023   Uremia 09/12/2023   Diabetes (HCC) 09/12/2023   Acute encephalopathy 09/12/2023   Urinary incontinence 08/30/2023   Hx of iron  deficiency anemia 08/22/2023   Sacral decubitus ulcer 08/18/2023   Bilateral pleural effusion 08/18/2023   GAD (generalized anxiety disorder) 08/18/2023   Chronic diastolic CHF (congestive heart failure) (HCC) 02/03/2023   History of CVA (cerebrovascular accident) 11/30/2022   Heart valve vegetation 11/23/2022   Noncompliance 11/20/2022   Requires continuous supervision for activities of daily living (ADL) 11/20/2022   Pre-ulcerative calluses 03/05/2022   Protein-calorie malnutrition, severe 01/06/2022   Atrial fibrillation with slow ventricular response (HCC) 01/03/2022   Visual hallucinations 01/03/2022   Hematuria 01/03/2022   Postherpetic neuralgia 08/11/2021   Blood clotting disorder 07/12/2021   Type 1 diabetes mellitus (HCC) 02/16/2021   Chronic diarrhea 02/16/2021   Sinus bradycardia 02/15/2021   Diabetic ulcer of left foot associated with type 1 diabetes mellitus (HCC) 06/30/2020   Pressure injury of skin 05/24/2020   BPH with obstruction/lower urinary tract symptoms 03/16/2020   Normocytic anemia 01/13/2020   Hyperlipidemia LDL goal <70 01/13/2020   Peripheral artery disease 01/13/2020   Coronary artery disease involving bypass graft of transplanted heart without angina pectoris 01/13/2020   Paroxysmal atrial fibrillation (HCC) 01/13/2020   Recurrent major depressive disorder, in partial remission 01/13/2020   Major depressive disorder,  recurrent episode, moderate (HCC) 12/09/2019   Tear of lateral meniscus of knee 10/05/2019   Aortic dissection (HCC) 02/05/2019   Hypertension 02/05/2019   Psoriasis 02/05/2019   Vitamin D  deficiency 01/07/2019   Microalbuminuria 01/07/2019   Patellar tendonitis of right knee 04/24/2018   Peyronie's disease 06/09/2015   Erectile dysfunction 03/15/2015   S/P aortic aneurysm repair 05/19/2012   S/P CABG (coronary artery bypass graft) 05/19/2012   Diabetic retinopathy (HCC) 02/27/2011   Contracture of hand joint 11/01/2010   Past Medical History:  Past Medical History:  Diagnosis Date   Acute urinary retention 01/04/2022   Altered mental status 08/18/2023   Anxiety    Arthritis    Cataract    CHF (congestive heart failure) (HCC)    Constipation    pt states goes EOD- hard stools    Coronary artery disease    Depression    Diabetes mellitus without complication (HCC)    Diabetic retinopathy (HCC)    DKA (diabetic ketoacidoses)    Hyperlipidemia    Hypertension    off meds   Hypertensive retinopathy    Lesion of ulnar nerve, left upper limb 07/12/2017   Non-intractable vomiting without nausea 06/10/2018   Postherpetic neuralgia 08/11/2021   Speculative diagnosis   Psoriasis    Tubular adenoma of colon 03/2019   UTI (urinary tract infection) 01/05/2022   Past Surgical History:  Past Surgical History:  Procedure Laterality Date   ABDOMINAL AORTOGRAM W/LOWER EXTREMITY N/A 08/30/2020   Procedure: ABDOMINAL AORTOGRAM W/LOWER EXTREMITY;  Surgeon: Serene Gaile ORN, MD;  Location: [facility] INVASIVE CV LAB;  Service: Cardiovascular;  Laterality: N/A;   ABDOMINAL AORTOGRAM W/LOWER EXTREMITY N/A 11/15/2020   Procedure: ABDOMINAL AORTOGRAM W/LOWER EXTREMITY;  Surgeon: Serene Gaile ORN, MD;  Location: [facility] INVASIVE CV LAB;  Service: Cardiovascular;  Laterality: N/A;   ANKLE SURGERY     right   CARDIAC CATHETERIZATION  09/25/2001   COLONOSCOPY     ~10 yr ago- normal  per pt    CORONARY  ARTERY BYPASS GRAFT  09/30/2001   CABG x 4   IR THORACENTESIS ASP PLEURAL SPACE W/IMG GUIDE  05/19/2020   IR THORACENTESIS ASP PLEURAL SPACE W/IMG GUIDE  02/04/2023   LAPAROSCOPIC INGUINAL HERNIA REPAIR Bilateral 02/09/2010   MASS EXCISION Left 03/12/2013   Procedure: LEFT INDEX EXCISION MASS AND DEBRIDMENT DISTAL INTERPHALANGEAL JOINT;  Surgeon: Franky JONELLE Curia, MD;  Location: [facility];  Service: Orthopedics;  Laterality: Left;   PERIPHERAL VASCULAR BALLOON ANGIOPLASTY Left 08/30/2020   Procedure: PERIPHERAL VASCULAR BALLOON ANGIOPLASTY;  Surgeon: Serene Gaile ORN, MD;  Location: [facility];  Service: Cardiovascular;  Laterality: Left;  Peroneal   TRANSESOPHAGEAL ECHOCARDIOGRAM (CATH LAB) N/A 11/26/2022   Procedure: TRANSESOPHAGEAL ECHOCARDIOGRAM;  Surgeon: Kate Lonni CROME, MD;  Location: [facility] INVASIVE CV LAB;  Service: Cardiovascular;  Laterality: N/A;   HPI:  Pt is a 68 y/o male presenting to [facility] on 10/22 after fall.  Imaging found L SDH, 4 mm shift.  Potential UTI. EEG negative. SLP completed MMSE 11/30/22 at the request of neurology, scoring 25/30 losing points on memory and orientation. PMH includes: PAF, chronic bil PE, CHF, CAD s/p CABG, PAD, DM2, chronic decubitus ulcer   Assessment / Plan / Recommendation Clinical Impression  Pt has a history of cognitive deficits, which are suspected to contribute to poor self care and nutrition PTA. Given reports of falls PTA, recommend 24/7 supervision to ensure safety post-acutely. Ongoing SLP f/u at his next venue of care may be beneficial to maintain CLOF, though ongoing acute interventions are not clinically indicated.   Pt scored 24 of the available 27 points on the MMSE (visual tasks deferred due to pt's baseline L eye blindness), which is overall similar to his performance on the same test 11/2022. Areas of difficulty include orientation, specifically to time, and memory. Despite his overall functional performance on the  MMSE, he presents with evidence of cognitive and pragmatic impairments during functional tasks. This inlcudes deficits related to working memory, turn-taking, and conservator, museum/gallery. His performance appears to vary significantly per staff documentation with fluctuating orientation, resulting in confabulation.   SLP Assessment  SLP Recommendation/Assessment: All further Speech Language Pathology needs can be addressed in the next venue of care SLP Visit Diagnosis: Cognitive communication deficit (R41.841)     Assistance Recommended at Discharge  Frequent or constant Supervision/Assistance  Functional Status Assessment Patient has had a recent decline in their functional status and demonstrates the ability to make significant improvements in function in a reasonable and predictable amount of time.  Frequency and Duration           SLP Evaluation Cognition  Overall Cognitive Status: History of cognitive impairments - at baseline Arousal/Alertness: Awake/alert Orientation Level: Oriented to person;Oriented to place;Oriented to situation;Disoriented to time Day of Week: Incorrect Attention: Sustained Sustained Attention: Appears intact Memory: Impaired Memory Impairment: Retrieval deficit;Decreased short term memory Decreased Short Term Memory: Verbal basic Awareness: Impaired Awareness Impairment: Intellectual impairment Problem Solving: Impaired Problem Solving Impairment: Functional basic       Comprehension  Auditory Comprehension Overall Auditory Comprehension: Appears within functional limits for tasks assessed    Expression Expression Primary Mode of Expression: Verbal Verbal Expression Overall Verbal Expression: Appears within functional limits for tasks assessed   Oral / Motor  Oral Motor/Sensory Function Overall Oral Motor/Sensory Function: Within functional limits Motor Speech Overall Motor Speech: Appears within functional limits for tasks assessed            Damien Blumenthal, M.A., CCC-SLP Speech Language Pathology, Acute Rehabilitation Services  Secure Chat preferred [phone]  10/30/2023, 11:57 AM

## 2023-10-30 NOTE — Assessment & Plan Note (Deleted)
-   Supplement Mag 

## 2023-10-30 NOTE — Assessment & Plan Note (Addendum)
 CKD ruled out Cr 1.4, now normalized to 0.8-0.9 - Continue losartan  - Hold spironolactone , Lasix  given orthostasis

## 2023-10-30 NOTE — Assessment & Plan Note (Addendum)
 Continue atorvastatin 

## 2023-10-30 NOTE — Assessment & Plan Note (Signed)
 Treated with antibitoics, completed 5 days

## 2023-10-30 NOTE — Hospital Course (Addendum)
 68 y.o. M with CAD, dCHF, PVD, DM, chronic weight loss presented with subdural hematoma.

## 2023-10-30 NOTE — Assessment & Plan Note (Addendum)
 CT head on admission showed 4 mm subdural hematoma on the left, no midline shift.  Neurosurgery were consulted, Eliquis  was held, and repeat imaging showed stability.  Since then, Eliquis  has been restarted and he has remained stable at baseline. - PT/OT

## 2023-10-30 NOTE — Assessment & Plan Note (Addendum)
-   Continue apixaban  - Continue atorvastatin , losartan

## 2023-10-30 NOTE — Assessment & Plan Note (Signed)
 As evidenced by severe diffuse loss of subcutaneous muscel mass and fat - Continue Ensure, MVI - Continue mirtazapine  - Consult dietitian

## 2023-10-30 NOTE — Progress Notes (Signed)
 Progress Note   Patient: [name] [name] [id number] DOB: Sep 25, 1955 DOA: 10/23/2023     7 DOS: the patient was seen and examined on 10/30/2023 at 9:59AM      Brief hospital course: 68 y.o. M with CAD, dCHF, PVD, DM, chronic weight loss presented with subdural hematoma.        Assessment and Plan: * Subdural hematoma (HCC) - PT/OT  Peripheral artery disease - Continue Eliquis , Lipitor  GAD (generalized anxiety disorder) - Continue Paxil , mirtazapine  - Hold home Seroquel   Possible UTI Treated with antibitoics, completed 5 days  Hypomagnesemia - Supplement Mag  AKI (acute kidney injury) CKD ruled out Cr 1.4, now normalized to 0.9 - Continue losartan  - Hold spironolactone , Lasix  for now  Chronic diastolic CHF (congestive heart failure) (HCC) Appears euvolemic to dehyrated. - Hold further fluids - Hold diuretics for today  Protein-calorie malnutrition, severe As evidenced by severe diffuse loss of subcutaneous muscel mass and fat - Continue Ensure, MVI - Continue mirtazapine  - Consult dietitian  Major depressive disorder, recurrent episode, moderate (HCC)    Paroxysmal atrial fibrillation (HCC) - Continue Eliquis  - Okay to stop tele  Coronary artery disease involving bypass graft of transplanted heart without angina pectoris - Continue Eliquis  - Continue Lipitor, losartan   Hyperlipidemia LDL goal <70 - Continue Lipitor  Normocytic anemia Hgb stable, no clinically bleeding observed or reported  S/P CABG (coronary artery bypass graft)            Subjective: Patient is eating his breakfast.  He has no complaints.  No new nursing concerns.  He is generally weak.  Today he tells me that he was admitted to the hospital because his son was moving some equipment into his house in the middle of the night, placed something on the back of the couch for a moment that fell and hit him in the head.     Physical Exam: BP (!) 158/77 (BP Location:  Right Arm)   Pulse (!) 58   Temp 97.6 F (36.4 C) (Oral)   Resp 16   Ht 6' 2 (1.88 m)   Wt 65.5 kg   SpO2 98%   BMI 18.54 kg/m   Thin adult male, lying in bed, eating breakfast Heart rate slow, regular, no murmurs, no peripheral edema Respiratory rate normal, lungs clear without rales or wheezes Abdomen soft, no tenderness palpation or guarding Severe diffuse loss of subcutaneous muscle mass and fat Makes eye contact, responds appropriate to questions.  4/5 strength in all 4 extremities, coordination lmited by generalized weakness.  Oriented to place, month, situation.      Data Reviewed: Basic metabolic panel shows resolved AKI.  Normal sodium and potassium. CBC shows mild anemia, no leukocytosis    Family Communication:     Disposition: Status is: Inpatient The patient lives independently, next-door to his son.  He was admitted for subdural hematoma, and has generalized weakness and now requires two-person assistance for transfers and walking.  To the best of my knowledge, the above diagnoses best describe the patient's illness, and all expected diagnostic testing that requires acute inpatient care has been completed.  At this point, he is medically ready to transition to an outpatient treatment plan, but because of his diminished functional status due to intracranial bleeding, in setting of his age, heart failure and baseline malnutrition, it is my medical opinion that discharge home at this time would pose a high risk unnecessary harm.  Safe disposition is being sought, and the patient will be  ready to transition to that setting as soon as arranged.         Author: Lonni SHAUNNA Dalton, MD 10/30/2023 11:01 AM  For on call review [email or web address].

## 2023-10-30 NOTE — Assessment & Plan Note (Addendum)
-   Continue apixaban, atorvastatin

## 2023-10-30 NOTE — Assessment & Plan Note (Addendum)
-   Continue paroxetine , quetiapine , mirtazapine

## 2023-10-30 NOTE — Assessment & Plan Note (Addendum)
 Hemoglobin stably low in the 7-8 range.  No clinical bleeding observed or reported.  Recent B12 and folate normal.  Iron  studies here consistent with anemia of chronic disease. -Periodic CBC

## 2023-10-30 NOTE — Assessment & Plan Note (Addendum)
-   Continue apixaban

## 2023-10-30 NOTE — Assessment & Plan Note (Addendum)
 No evidence of fluid overload.  Orthostatic, and seems prone to inadequate fluid intake, so we will stop diuretics - Stop furosemide  and spironolactone 

## 2023-10-31 DIAGNOSIS — S065XAA Traumatic subdural hemorrhage with loss of consciousness status unknown, initial encounter: Secondary | ICD-10-CM | POA: Diagnosis not present

## 2023-10-31 LAB — GLUCOSE, CAPILLARY
Glucose-Capillary: 139 mg/dL — ABNORMAL HIGH (ref 70–99)
Glucose-Capillary: 154 mg/dL — ABNORMAL HIGH (ref 70–99)
Glucose-Capillary: 165 mg/dL — ABNORMAL HIGH (ref 70–99)
Glucose-Capillary: 180 mg/dL — ABNORMAL HIGH (ref 70–99)
Glucose-Capillary: 96 mg/dL (ref 70–99)

## 2023-10-31 LAB — VITAMIN B1: Vitamin B1 (Thiamine): 94.4 nmol/L (ref 66.5–200.0)

## 2023-10-31 NOTE — Plan of Care (Signed)

## 2023-10-31 NOTE — Telephone Encounter (Signed)
 Dr Leigh-  Please see notes below... patient is on hospital wait list for colonoscopy. However, he is currently admitted to the hospital for a subdural hematoma following an unwitnessed fall/event at home on 10/23/23.  Please advise.[email or web address]

## 2023-10-31 NOTE — Progress Notes (Signed)
  Progress Note   Patient: [name] [name] [id number] DOB: [birth year] DOA: 10/23/2023     8 DOS: the patient was seen and examined on 10/31/2023 at 8:54AM      Brief hospital course: 68 y.o. M with CAD, dCHF, PVD, DM, chronic weight loss presented with subdural hematoma.        Assessment and Plan: * Subdural hematoma (HCC) - PT/OT  Peripheral artery disease - Continue Eliquis , Lipitor  GAD (generalized anxiety disorder) - Continue Paxil , mirtazapine  - Hold home Seroquel   Possible UTI Treated with antibitoics, completed 5 days  Hypomagnesemia  AKI (acute kidney injury) CKD ruled out Cr 1.4, now normalized to 0.9 - Continue losartan  - Hold spironolactone , Lasix   Chronic diastolic CHF (congestive heart failure) (HCC) Appears euvolemic to dehyrated. - Hold diuretics  Protein-calorie malnutrition, severe As evidenced by severe diffuse loss of subcutaneous muscel mass and fat - Continue Ensure, MVI - Continue mirtazapine  - Consult dietitian  Major depressive disorder, recurrent episode, moderate (HCC)    Paroxysmal atrial fibrillation (HCC) - Continue Eliquis   Coronary artery disease involving bypass graft of transplanted heart without angina pectoris - Continue Eliquis  - Continue Lipitor, losartan   Hyperlipidemia LDL goal <70 - Continue Lipitor  Normocytic anemia Hgb stable, no clinically bleeding observed or reported  S/P CABG (coronary artery bypass graft)            Subjective: No new complaints, no concerns.     Physical Exam: BP 137/70 (BP Location: Right Arm)   Pulse (!) 48   Temp 98.1 F (36.7 C) (Oral)   Resp 18   Ht 6' 2 (1.88 m)   Wt 65.5 kg   SpO2 95%   BMI 18.54 kg/m   Thin adult male, lying in bed, no acute distress, disheveled Slow irregular, no murmurs, no LE edema Respiratory rate normal, lungs clear Abdomen soft, no TTP Mentation unchagned, upper and lower extremity strength 4/5 and symmetric   Data  Reviewed: None new    Disposition: Status is: Inpatient         Author: Lonni SHAUNNA Dalton, MD 10/31/2023 5:32 PM  For on call review [email or web address].

## 2023-10-31 NOTE — TOC Progression Note (Signed)
 Transition of Care [facility]) - Progression Note    Patient Details  Name: [name] [name] MRN: [id number] Date of Birth: 05-Oct-1955  Transition of [facility] CM/SW Contact  [name], [place] Phone Number: 10/31/2023, 4:29 PM  Clinical Narrative:     10:30 am - spoke with MD- he informed, talked with HTA Dr Janit to get some insight of how we should move forward w/ appeal or re-start patient's auth for SNF.  He explained per Dr Janit, rescind the appeal, re-start his authorization for SNF and then call Dr Janit.   12:39  pm - explained to patient's son to rescind the appeal   2:24 pm - received  text message from the patient's son, that he has rescinded the appeal- and he talked with Financial Counselor Antoni, and was told patient receives too much SSI for SNF or ALF he would only be eligible for LTC Medicaid.   2:51 pm - re-started auth for SNF and LVM w/ Dr Janit  Patient has already been approved for PTAR  3:00 pm CSW spoke with patient's son, he inquired what will happen if his insurance denies auth again. CSW explained, if his insurance denies, once he is deemed safe to return home he will d/c home than TOC will contact APS to follow up in the community( patient will have to be home for APS to continue their assessment).   TOC is waiting on insurance authorization TOC will continue to follow and assist with discharge planning.  Montie Louder, MSW, LCSW Clinical Social Worker       Expected Discharge Plan: Skilled Nursing Facility Barriers to Discharge: English As A Second Language Teacher, Continued Medical Work up, SNF Pending bed offer               Expected Discharge Plan and Services In-house Referral: Clinical Social Work                                             Social Drivers of Health (SDOH) Interventions SDOH Screenings   Food Insecurity: No Food Insecurity (10/23/2023)  Recent Concern: Food Insecurity - Food Insecurity Present (08/18/2023)   Housing: Low Risk  (10/23/2023)  Transportation Needs: No Transportation Needs (10/23/2023)  Utilities: Not At Risk (10/23/2023)  Alcohol Screen: Low Risk  (08/19/2023)  Depression (PHQ2-9): Medium Risk (07/26/2023)  Financial Resource Strain: Low Risk  (08/19/2023)  Physical Activity: Inactive (11/21/2022)  Social Connections: Socially Isolated (10/23/2023)  Stress: Stress Concern Present (11/21/2022)  Tobacco Use: High Risk (10/24/2023)  Health Literacy: Adequate Health Literacy (11/21/2022)    Readmission Risk Interventions    08/20/2023    4:20 PM  Readmission Risk Prevention Plan  Transportation Screening Complete  PCP or Specialist Appt within 3-5 Days Complete  HRI or Home Care Consult Complete  Palliative Care Screening Not Applicable  Medication Review (RN Care Manager) Complete

## 2023-10-31 NOTE — Progress Notes (Signed)
 Physical Therapy Treatment Patient Details Name: [name] [name] MRN: [id number] DOB: 1955-07-02 Today's Date: 10/31/2023   History of Present Illness Pt is a 68 y/o male presenting to [facility] on 10/22 after fall.  Imaging found L SDH, 4 mm shift.  Potential UTI. EEG negative. +orthostasis  PMH includes: PAF, chronic bil PE, CHF, CAD s/p CABG, PAD, DM2, chronic decubitus ulcer.    PT Comments  Patient more alert and better able to participate. Some confused statements, however mostly appropriate. Rolling min assist with rails, up to sit mod assist, sit to stand +2 min assist with RW. Pt incontinent of stool and stood for partial cleaning and had to sit due to dizziness. BP 117/69 to 96/62. Stood again for completing cleaning and side-stepped toward [facility] with RW and min assist. Patient deferred OOB to chair due to painful buttock with decr skin integrity noted. RN aware. Patient will benefit from continued inpatient follow up therapy, <3 hours/day.     If plan is discharge home, recommend the following: Two people to help with walking and/or transfers;Assistance with cooking/housework;Assist for transportation;Help with stairs or ramp for entrance   Can travel by private vehicle     No  Equipment Recommendations  None recommended by PT    Recommendations for Other Services       Precautions / Restrictions Precautions Precautions: Fall Recall of Precautions/Restrictions: Impaired Precaution/Restrictions Comments: watch BP, orthostatic Restrictions Weight Bearing Restrictions Per Provider Order: No     Mobility  Bed Mobility Overal bed mobility: Needs Assistance Bed Mobility: Rolling, Sidelying to Sit, Sit to Supine Rolling: Mod assist, Used rails Sidelying to sit: HOB elevated, Used rails, Min assist   Sit to supine: Contact guard assist, Used rails   General bed mobility comments: more alert and better able to assist himself with mobility    Transfers Overall transfer level:  Needs assistance Equipment used: Rolling walker (2 wheels) Transfers: Sit to/from Stand Sit to Stand: Min assist, +2 safety/equipment           General transfer comment: Stood from EOB twice (incontinent initially; second time pt cleaned and able to side-step to [facility]) Pt refused OOB to recliner due to sore bottom with +breakdown noted (RN aware)    Ambulation/Gait               General Gait Details: side stepping only due to orthostasis   Stairs             Wheelchair Mobility     Tilt Bed    Modified Rankin (Stroke Patients Only)       Balance Overall balance assessment: Needs assistance Sitting-balance support: No upper extremity supported, Feet supported Sitting balance-Leahy Scale: Good     Standing balance support: During functional activity, Reliant on assistive device for balance, Bilateral upper extremity supported Standing balance-Leahy Scale: Poor                              Communication Communication Communication: Impaired Factors Affecting Communication: Hearing impaired  Cognition Arousal: Alert Behavior During Therapy: Flat affect                           PT - Cognition Comments: Some statements non-sensical Following commands: Impaired Following commands impaired: Follows one step commands with increased time    Cueing Cueing Techniques: Verbal cues  Exercises      General Comments General comments (  skin integrity, edema, etc.): BP supine 117/69 (83); after standing 96/62 (72) with +dizziness      Pertinent Vitals/Pain Pain Assessment Pain Assessment: Faces Faces Pain Scale: Hurts even more Pain Location: buttocks Pain Descriptors / Indicators: Burning, Grimacing Pain Intervention(s): Limited activity within patient's tolerance, Monitored during session, Repositioned    Home Living                          Prior Function            PT Goals (current goals can now be found in the  care plan section) Acute Rehab PT Goals Patient Stated Goal: none stated Time For Goal Achievement: 11/07/23 Potential to Achieve Goals: Good Progress towards PT goals: Not progressing toward goals - comment (limited by orthostasis)    Frequency    Min 2X/week      PT Plan      Co-evaluation              AM-PAC PT 6 Clicks Mobility   Outcome Measure  Help needed turning from your back to your side while in a flat bed without using bedrails?: A Lot Help needed moving from lying on your back to sitting on the side of a flat bed without using bedrails?: A Lot Help needed moving to and from a bed to a chair (including a wheelchair)?: Total Help needed standing up from a chair using your arms (e.g., wheelchair or bedside chair)?: Total Help needed to walk in hospital room?: Total Help needed climbing 3-5 steps with a railing? : Total 6 Click Score: 8    End of Session Equipment Utilized During Treatment: Gait belt Activity Tolerance: Treatment limited secondary to medical complications (Comment) (dizziness, orthostasis) Patient left: with call bell/phone within reach;in bed;with bed alarm set;with SCD's reapplied (HOB elevated 40 degrees) Nurse Communication: Mobility status;Other (comment) (needs new dressing on buttock) PT Visit Diagnosis: Unsteadiness on feet (R26.81);Other abnormalities of gait and mobility (R26.89);Muscle weakness (generalized) (M62.81);History of falling (Z91.81);Dizziness and giddiness (R42)     Time: 8672-8653 PT Time Calculation (min) (ACUTE ONLY): 19 min  Charges:    $Therapeutic Activity: 8-22 mins PT General Charges $$ ACUTE PT VISIT: 1 Visit                      Macario RAMAN, PT Acute Rehabilitation Services  Office [phone]    Macario SHAUNNA Soja 10/31/2023, 2:48 PM

## 2023-10-31 NOTE — Inpatient Diabetes Management (Addendum)
 Inpatient Diabetes Program Recommendations  AACE/ADA: New Consensus Statement on Inpatient Glycemic Control   Target Ranges:  Prepandial:   less than 140 mg/dL      Peak postprandial:   less than 180 mg/dL (1-2 hours)      Critically ill patients:  140 - 180 mg/dL   Lab Results  Component Value Date   GLUCAP 165 (H) 10/31/2023   HGBA1C 9.1 (H) 07/26/2023    Latest Reference Range & Units 10/30/23 08:25 10/30/23 11:54 10/30/23 18:33 10/31/23 00:38  Glucose-Capillary 70 - 99 mg/dL 880 (H) 796 (H) 780 (H) 165 (H)   Review of Glycemic Control  Diabetes history: DM  Outpatient Diabetes medications:  Glucotrol  5mg  BID  Januvia  100mg  daily   Current orders for Inpatient glycemic control:  Semglee  8 units daily  Novolog  0-20 units TID   Inpatient Diabetes Program Recommendations:   Noted last A1C was 9.1% on 07/26/23.   Please consider ordering an updated Hemoglobin A1C.  Thanks,  Lavanda Search, RN, MSN, [facility]  Inpatient Diabetes Coordinator  Pager [phone] (8a-5p)

## 2023-10-31 NOTE — TOC Progression Note (Signed)
 Transition of Care [facility]) - Progression Note    Patient Details  Name: [name] [name] MRN: [id number] Date of Birth: Mar 08, 1955  Transition of [facility]) CM/SW Contact  [name], [place] Phone Number: 10/31/2023, 8:57 AM  Clinical Narrative:     CSW met with patient at bedside. CSW confirmed w/patient he remains agreeable to rehab at [facility] and or [facility] depending on the outcome of appeal.   TOC will continue to follow and assist with discharge planning.   Montie Louder, MSW, LCSW Clinical Social Worker    Expected Discharge Plan: Skilled Nursing Facility Barriers to Discharge: English As A Second Language Teacher, Continued Medical Work up, SNF Pending bed offer               Expected Discharge Plan and Services In-house Referral: Clinical Social Work                                             Social Drivers of Health (SDOH) Interventions SDOH Screenings   Food Insecurity: No Food Insecurity (10/23/2023)  Recent Concern: Food Insecurity - Food Insecurity Present (08/18/2023)  Housing: Low Risk  (10/23/2023)  Transportation Needs: No Transportation Needs (10/23/2023)  Utilities: Not At Risk (10/23/2023)  Alcohol Screen: Low Risk  (08/19/2023)  Depression (PHQ2-9): Medium Risk (07/26/2023)  Financial Resource Strain: Low Risk  (08/19/2023)  Physical Activity: Inactive (11/21/2022)  Social Connections: Socially Isolated (10/23/2023)  Stress: Stress Concern Present (11/21/2022)  Tobacco Use: High Risk (10/24/2023)  Health Literacy: Adequate Health Literacy (11/21/2022)    Readmission Risk Interventions    08/20/2023    4:20 PM  Readmission Risk Prevention Plan  Transportation Screening Complete  PCP or Specialist Appt within 3-5 Days Complete  HRI or Home Care Consult Complete  Palliative Care Screening Not Applicable  Medication Review (RN Care Manager) Complete

## 2023-11-01 DIAGNOSIS — L8990 Pressure ulcer of unspecified site, unspecified stage: Secondary | ICD-10-CM | POA: Insufficient documentation

## 2023-11-01 DIAGNOSIS — S065XAA Traumatic subdural hemorrhage with loss of consciousness status unknown, initial encounter: Secondary | ICD-10-CM | POA: Diagnosis not present

## 2023-11-01 LAB — GLUCOSE, CAPILLARY
Glucose-Capillary: 72 mg/dL (ref 70–99)
Glucose-Capillary: 91 mg/dL (ref 70–99)
Glucose-Capillary: 211 mg/dL — ABNORMAL HIGH (ref 70–99)
Glucose-Capillary: 213 mg/dL — ABNORMAL HIGH (ref 70–99)
Glucose-Capillary: 319 mg/dL — ABNORMAL HIGH (ref 70–99)

## 2023-11-01 MED ORDER — QUETIAPINE FUMARATE 25 MG PO TABS
25.0000 mg | ORAL_TABLET | Freq: Every day | ORAL | Status: DC
  Administered 2023-11-01 – 2023-11-05 (×5): 25 mg via ORAL
  Filled 2023-11-01 (×5): qty 1

## 2023-11-01 MED ORDER — INSULIN ASPART 100 UNIT/ML IJ SOLN
0.0000 [IU] | Freq: Every day | INTRAMUSCULAR | Status: DC
  Administered 2023-11-01: 4 [IU] via SUBCUTANEOUS
  Administered 2023-11-03: 2 [IU] via SUBCUTANEOUS
  Filled 2023-11-01: qty 4

## 2023-11-01 MED ORDER — SPIRONOLACTONE 25 MG PO TABS: 25.0000 mg | ORAL_TABLET | Freq: Every day | ORAL | Status: DC

## 2023-11-01 MED ORDER — ACETAMINOPHEN 325 MG PO TABS
650.0000 mg | ORAL_TABLET | Freq: Four times a day (QID) | ORAL | Status: DC | PRN
  Administered 2023-11-01: 650 mg via ORAL
  Filled 2023-11-01: qty 2

## 2023-11-01 MED ORDER — TRAMADOL HCL 50 MG PO TABS
50.0000 mg | ORAL_TABLET | Freq: Once | ORAL | Status: AC
  Administered 2023-11-01: 50 mg via ORAL
  Filled 2023-11-01: qty 1

## 2023-11-01 MED ORDER — SPIRONOLACTONE 12.5 MG HALF TABLET
12.5000 mg | ORAL_TABLET | Freq: Every day | ORAL | Status: DC
Start: 2023-11-02 — End: 2023-11-02

## 2023-11-01 NOTE — TOC Progression Note (Signed)
 Transition of Care [facility]) - Progression Note    Patient Details  Name: [name] [name] MRN: [id number] Date of Birth: 12-18-1955  Transition of Care [facility]) CM/SW Contact  Lauraine FORBES Saa, LCSWA Phone Number: 11/01/2023, 4:32 PM  Clinical Narrative:     4:32 PM SNF insurance authorization is currently pending. CSW to follow up with patient on SNF LTC. CSW will continue to follow.  Expected Discharge Plan: Skilled Nursing Facility Barriers to Discharge: English As A Second Language Teacher, Continued Medical Work up, SNF Pending bed offer               Expected Discharge Plan and Services In-house Referral: Clinical Social Work                                             Social Drivers of Health (SDOH) Interventions SDOH Screenings   Food Insecurity: No Food Insecurity (10/23/2023)  Recent Concern: Food Insecurity - Food Insecurity Present (08/18/2023)  Housing: Low Risk  (10/23/2023)  Transportation Needs: No Transportation Needs (10/23/2023)  Utilities: Not At Risk (10/23/2023)  Alcohol Screen: Low Risk  (08/19/2023)  Depression (PHQ2-9): Medium Risk (07/26/2023)  Financial Resource Strain: Low Risk  (08/19/2023)  Physical Activity: Inactive (11/21/2022)  Social Connections: Socially Isolated (10/23/2023)  Stress: Stress Concern Present (11/21/2022)  Tobacco Use: High Risk (10/24/2023)  Health Literacy: Adequate Health Literacy (11/21/2022)    Readmission Risk Interventions    08/20/2023    4:20 PM  Readmission Risk Prevention Plan  Transportation Screening Complete  PCP or Specialist Appt within 3-5 Days Complete  HRI or Home Care Consult Complete  Palliative Care Screening Not Applicable  Medication Review (RN Care Manager) Complete

## 2023-11-01 NOTE — Progress Notes (Signed)
  Progress Note   Patient: [name] [name] FMW:[id number] DOB: [birth year] DOA: 10/23/2023     9 DOS: the patient was seen and examined on 11/01/2023 at 9:40AM      Brief hospital course: 68 y.o. M with CAD, dCHF, PVD, DM, chronic weight loss presented with subdural hematoma.        Assessment and Plan: * Subdural hematoma (HCC) - PT/OT  Capacity MMSE testing 10 months ago and again this admission were 25/30 and 24/30.  Given his visual impairment limits his score to out of 27 actually and not 30, this is actually fairly good.  I cannot explain some of his confabulations other than trying to assign blame for his predicament to others and avoid lsoing his physical independence, but overall his interactions demonstrate ordered thinking, no gross deficits in short term memory, and I believe he has a reasonable level of understanding of the proposed disposition and alternatives, and his resistance to certain dispo options reflects his values, not confusion or memory impairment.  Peripheral artery disease - Continue Eliquis , Lipitor  GAD (generalized anxiety disorder) - Continue Paxil , mirtazapine  - Resume home Seroquel   Stage ii Pressure injury sacrum, present on admission - Barrier pad  Possible UTI Treated with antibitoics, completed 5 days  Hypomagnesemia Resolved  AKI (acute kidney injury) CKD ruled out Cr 1.4, now normalized to 0.9 - Continue losartan  - Resume spironolactone , lower dose  Chronic diastolic CHF (congestive heart failure) (HCC) Appears euvolemic to dehyrated.  On Lasix  only PRN - Resume spiro  Protein-calorie malnutrition, severe As evidenced by severe diffuse loss of subcutaneous muscel mass and fat - Continue Ensure, MVI - Continue mirtazapine  - Consult dietitian  Major depressive disorder, recurrent episode, moderate (HCC)    Paroxysmal atrial fibrillation (HCC) - Continue Eliquis  - Okay to stop tele  Coronary artery disease involving  bypass graft of transplanted heart without angina pectoris - Continue Eliquis  - Continue Lipitor, losartan   Hyperlipidemia LDL goal <70 - Continue Lipitor  Normocytic anemia Hgb stable, no clinically bleeding observed or reported  S/P CABG (coronary artery bypass graft)            Subjective: No fever, no new complaints.     Physical Exam: BP 125/69 (BP Location: Right Arm)   Pulse (!) 46   Temp 97.7 F (36.5 C) (Oral)   Resp 16   Ht 6' 2 (1.88 m)   Wt 65.5 kg   SpO2 99%   BMI 18.54 kg/m   Thin adult male, malnourished, lying in bed, no acute distress, dishelveled Slow no murmurs, no LE edema Respiratory rate normal, lungs clear without rales or wheezes Abdomen soft no tenderness palpation or guarding, no ascites or distention Attention normal.  Oriented x4.  Moves upper and lower extremities with normal strength and coordination.     Data Reviewed: No new labs   Family Communication:     Disposition: Status is: Inpatient         Author: Lonni SHAUNNA Dalton, MD 11/01/2023 3:31 PM  For on call review [email or web address]

## 2023-11-01 NOTE — Assessment & Plan Note (Addendum)
-   Barrier pad

## 2023-11-01 NOTE — Progress Notes (Signed)
 Occupational Therapy Treatment Patient Details Name: [name] [name] MRN: [id number] DOB: 1955-06-25 Today's Date: 11/01/2023   History of present illness Pt is a 68 y/o male presenting to [facility] on 10/22 after fall.  Imaging found L SDH, 4 mm shift.  Potential UTI. EEG negative. +orthostasis  PMH includes: PAF, chronic bil PE, CHF, CAD s/p CABG, PAD, DM2, chronic decubitus ulcer.   OT comments  Pt. Seen for skilled OT treatment session.  Pt. Able to complete bed mobility with MIN A to eob and CGA back to bed.  Demonstrated LB dressing task with socks CGA seated. Sit/stand CGA along with several side steps towards [facility] for back to bed.  Pt. Reports hx. Of when he would stand up he would get dizzy/light headed and he would vomit.  RN made aware and will also pass along to PT.  Pt. Declined having those feelings today during positional changes and when I was taking multiple BPs.  Pt. Reports that his son comes every week and sets up his automated pill box organizer and describes that it makes a sound when he is supposed to take his pills.  Agree with current d/c recommendations  and will cont. With acute OT POC.   Initial supine: 184/80-57 Supine after 5 min.: 151/82-49 Sitting: 129/70-56 Standing: 111/61-66       If plan is discharge home, recommend the following:  A little help with bathing/dressing/bathroom;A lot of help with walking and/or transfers;Direct supervision/assist for financial management;Direct supervision/assist for medications management;Assistance with cooking/housework;Assist for transportation;Help with stairs or ramp for entrance   Equipment Recommendations  BSC/3in1    Recommendations for Other Services      Precautions / Restrictions Precautions Precautions: Fall Recall of Precautions/Restrictions: Impaired Precaution/Restrictions Comments: watch BP, orthostatic       Mobility Bed Mobility Overal bed mobility: Needs Assistance Bed Mobility: Rolling, Sidelying  to Sit, Sit to Supine Rolling: Mod assist, Used rails Sidelying to sit: HOB elevated, Used rails, Min assist   Sit to supine: Contact guard assist, Used rails   General bed mobility comments: more alert and better able to assist himself with mobility    Transfers Overall transfer level: Needs assistance Equipment used: Rolling walker (2 wheels) Transfers: Sit to/from Stand Sit to Stand: Contact guard assist           General transfer comment: able to sit/stand x2 with CGA.  side step to R x3 in prep for back to bed     Balance                                           ADL either performed or assessed with clinical judgement   ADL Overall ADL's : Needs assistance/impaired                     Lower Body Dressing: Set up;Sitting/lateral leans Lower Body Dressing Details (indicate cue type and reason): able to access BLEs modified figure 4 without leaning too far forward                    Extremity/Trunk Assessment              Vision       Perception     Praxis     Communication Communication Communication: Impaired Factors Affecting Communication: Hearing impaired   Cognition Arousal: Alert Behavior During Therapy: Flat  affect               OT - Cognition Comments: pt. reports his son organizes his pills every week with and electric pill organizer that makes a sound when its time to take a pill                 Following commands: Impaired Following commands impaired: Follows one step commands with increased time      Cueing   Cueing Techniques: Verbal cues  Exercises      Shoulder Instructions       General Comments      Pertinent Vitals/ Pain       Pain Assessment Pain Assessment: No/denies pain  Home Living                                          Prior Functioning/Environment              Frequency  Min 2X/week        Progress Toward Goals  OT Goals(current  goals can now be found in the care plan section)  Progress towards OT goals: Progressing toward goals     Plan      Co-evaluation                 AM-PAC OT 6 Clicks Daily Activity     Outcome Measure   Help from another person eating meals?: A Little Help from another person taking care of personal grooming?: A Little Help from another person toileting, which includes using toliet, bedpan, or urinal?: Total Help from another person bathing (including washing, rinsing, drying)?: A Lot Help from another person to put on and taking off regular upper body clothing?: A Little Help from another person to put on and taking off regular lower body clothing?: A Lot 6 Click Score: 14    End of Session Equipment Utilized During Treatment: Rolling walker (2 wheels)  OT Visit Diagnosis: Other abnormalities of gait and mobility (R26.89);Muscle weakness (generalized) (M62.81);Other symptoms and signs involving cognitive function   Activity Tolerance Patient tolerated treatment well   Patient Left in bed;with call bell/phone within reach;with bed alarm set   Nurse Communication Other (comment) (rn states ok to work with pt. alerted her of BPs)        Time: 8773-8753 OT Time Calculation (min): 20 min  Charges: OT General Charges $OT Visit: 1 Visit OT Treatments $Self Care/Home Management : 8-22 mins  Randall, COTA/L Acute Rehabilitation [phone]   CHRISTELLA Nest Lorraine-COTA/L  11/01/2023, 1:07 PM

## 2023-11-01 NOTE — Plan of Care (Signed)

## 2023-11-02 DIAGNOSIS — S065XAA Traumatic subdural hemorrhage with loss of consciousness status unknown, initial encounter: Secondary | ICD-10-CM | POA: Diagnosis not present

## 2023-11-02 LAB — COMPREHENSIVE METABOLIC PANEL WITH GFR
ALT: 36 U/L (ref 0–44)
AST: 45 U/L — ABNORMAL HIGH (ref 15–41)
Albumin: 1.9 g/dL — ABNORMAL LOW (ref 3.5–5.0)
Alkaline Phosphatase: 157 U/L — ABNORMAL HIGH (ref 38–126)
Anion gap: 9 (ref 5–15)
BUN: 40 mg/dL — ABNORMAL HIGH (ref 8–23)
CO2: 23 mmol/L (ref 22–32)
Calcium: 7.9 mg/dL — ABNORMAL LOW (ref 8.9–10.3)
Chloride: 103 mmol/L (ref 98–111)
Creatinine, Ser: 1.09 mg/dL (ref 0.61–1.24)
GFR, Estimated: >60 mL/min (ref >60)
Glucose, Bld: 115 mg/dL — ABNORMAL HIGH (ref 70–99)
Potassium: 4.8 mmol/L (ref 3.5–5.1)
Sodium: 135 mmol/L (ref 135–145)
Total Bilirubin: 0.3 mg/dL (ref 0.0–1.2)
Total Protein: 5.7 g/dL — ABNORMAL LOW (ref 6.5–8.1)

## 2023-11-02 LAB — CBC
HCT: 25.8 % — ABNORMAL LOW (ref 39.0–52.0)
Hemoglobin: 7.9 g/dL — ABNORMAL LOW (ref 13.0–17.0)
MCH: 27 pg (ref 26.0–34.0)
MCHC: 30.6 g/dL (ref 30.0–36.0)
MCV: 88.1 fL (ref 80.0–100.0)
Platelets: 198 K/uL (ref 150–400)
RBC: 2.93 MIL/uL — ABNORMAL LOW (ref 4.22–5.81)
RDW: 16.1 % — ABNORMAL HIGH (ref 11.5–15.5)
WBC: 5.4 K/uL (ref 4.0–10.5)
nRBC: 0 % (ref 0.0–0.2)

## 2023-11-02 LAB — GLUCOSE, CAPILLARY
Glucose-Capillary: 147 mg/dL — ABNORMAL HIGH (ref 70–99)
Glucose-Capillary: 143 mg/dL — ABNORMAL HIGH (ref 70–99)
Glucose-Capillary: 102 mg/dL — ABNORMAL HIGH (ref 70–99)
Glucose-Capillary: 114 mg/dL — ABNORMAL HIGH (ref 70–99)
Glucose-Capillary: 129 mg/dL — ABNORMAL HIGH (ref 70–99)

## 2023-11-02 MED ORDER — LACTATED RINGERS IV SOLN
INTRAVENOUS | Status: AC
  Administered 2023-11-02: 75 mL/h via INTRAVENOUS

## 2023-11-02 NOTE — Plan of Care (Signed)
  Problem: Coping: Goal: Ability to adjust to condition or change in health will improve Outcome: Progressing   Problem: Fluid Volume: Goal: Ability to maintain a balanced intake and output will improve Outcome: Progressing   Problem: Metabolic: Goal: Ability to maintain appropriate glucose levels will improve Outcome: Progressing   Problem: Nutritional: Goal: Maintenance of adequate nutrition will improve Outcome: Progressing Goal: Progress toward achieving an optimal weight will improve Outcome: Progressing   Problem: Skin Integrity: Goal: Risk for impaired skin integrity will decrease Outcome: Progressing   Problem: Tissue Perfusion: Goal: Adequacy of tissue perfusion will improve Outcome: Progressing   Problem: Clinical Measurements: Goal: Will remain free from infection Outcome: Progressing

## 2023-11-02 NOTE — Progress Notes (Signed)
  Progress Note   Patient: [name] [name] FMW:[id number] DOB: Feb 03, 1955 DOA: 10/23/2023     10 DOS: the patient was seen and examined on 11/02/2023 at 11:45AM      Brief hospital course: 68 y.o. M with CAD, dCHF, PVD, DM, chronic weight loss presented with subdural hematoma.        Assessment and Plan: * Subdural hematoma (HCC) - PT/OT  Peripheral artery disease - Continue Eliquis , Lipitor  GAD (generalized anxiety disorder) - Continue Paxil , mirtazapine , Seroquel   Stage ii Pressure injury sacrum, present on admission - Barrier pad  Possible UTI Treated with antibitoics, completed 5 days  Hypomagnesemia Supplemented  Orthostasis Asymptomatic 60 pt drop in BP with standing yesterday - IV fluids  AKI (acute kidney injury) CKD ruled out Cr 1.4, now normalized to 0.9 Uses Lasix  PRN - Continue losartan  - Hold spironolactone   Chronic diastolic CHF (congestive heart failure) (HCC) Appears euvolemic to dehyrated.  Home lasix  is PRN - Hold diuretics   Protein-calorie malnutrition, severe As evidenced by severe diffuse loss of subcutaneous muscel mass and fat - Continue Ensure, MVI - Continue mirtazapine  - Consult dietitian  Major depressive disorder, recurrent episode, moderate (HCC)    Paroxysmal atrial fibrillation (HCC) - Continue Eliquis  - Okay to stop tele  Coronary artery disease involving bypass graft of transplanted heart without angina pectoris - Continue Eliquis  - Continue Lipitor, losartan   Hyperlipidemia LDL goal <70 - Continue Lipitor  Normocytic anemia Hgb stable, no clinically bleeding observed or reported  S/P CABG (coronary artery bypass graft)            Subjective: No new complaints, no nursing concersn.     Physical Exam: BP 101/66 (BP Location: Right Arm)   Pulse (!) 54   Temp 98 F (36.7 C) (Oral)   Resp 16   Ht 6' 2 (1.88 m)   Wt 65.5 kg   SpO2 98%   BMI 18.54 kg/m   General: Cachectic.  Pt is alert,  awake, not in acute distress Cardiovascular: RRR, nl S1-S2, no murmurs appreciated.   No LE edema.   Respiratory: Normal respiratory rate and rhythm.  CTAB without rales or wheezes. Abdominal: Abdomen soft and non-tender.  No distension or HSM.   Neuro/Psych: Strength symmetric in upper and lower extremities.  Judgment and insight appear at baseline.   Data Reviewed: BMP shows elevated BUN to Cr ratio, normal Cr CBC shows Hgb slightly down to 7.9    Family Communication:     Disposition: Status is: Inpatient         Author: Lonni SHAUNNA Dalton, MD 11/02/2023 3:48 PM  For on call review [email or web address]

## 2023-11-03 DIAGNOSIS — S065XAA Traumatic subdural hemorrhage with loss of consciousness status unknown, initial encounter: Secondary | ICD-10-CM | POA: Diagnosis not present

## 2023-11-03 LAB — CBC
HCT: 26.6 % — ABNORMAL LOW (ref 39.0–52.0)
Hemoglobin: 8.3 g/dL — ABNORMAL LOW (ref 13.0–17.0)
MCH: 27.5 pg (ref 26.0–34.0)
MCHC: 31.2 g/dL (ref 30.0–36.0)
MCV: 88.1 fL (ref 80.0–100.0)
Platelets: 220 K/uL (ref 150–400)
RBC: 3.02 MIL/uL — ABNORMAL LOW (ref 4.22–5.81)
RDW: 16.1 % — ABNORMAL HIGH (ref 11.5–15.5)
WBC: 6.4 K/uL (ref 4.0–10.5)
nRBC: 0 % (ref 0.0–0.2)

## 2023-11-03 LAB — BASIC METABOLIC PANEL WITH GFR
Anion gap: 6 (ref 5–15)
BUN: 33 mg/dL — ABNORMAL HIGH (ref 8–23)
CO2: 27 mmol/L (ref 22–32)
Calcium: 8 mg/dL — ABNORMAL LOW (ref 8.9–10.3)
Chloride: 103 mmol/L (ref 98–111)
Creatinine, Ser: 0.84 mg/dL (ref 0.61–1.24)
GFR, Estimated: >60 mL/min (ref >60)
Glucose, Bld: 92 mg/dL (ref 70–99)
Potassium: 4.5 mmol/L (ref 3.5–5.1)
Sodium: 136 mmol/L (ref 135–145)

## 2023-11-03 LAB — GLUCOSE, CAPILLARY
Glucose-Capillary: 69 mg/dL — ABNORMAL LOW (ref 70–99)
Glucose-Capillary: 74 mg/dL (ref 70–99)
Glucose-Capillary: 170 mg/dL — ABNORMAL HIGH (ref 70–99)
Glucose-Capillary: 177 mg/dL — ABNORMAL HIGH (ref 70–99)
Glucose-Capillary: 233 mg/dL — ABNORMAL HIGH (ref 70–99)

## 2023-11-03 NOTE — Plan of Care (Signed)

## 2023-11-03 NOTE — Progress Notes (Signed)
  Progress Note   Patient: [name] [name] FMW:[id number] DOB: 03-11-1955 DOA: 10/23/2023     11 DOS: the patient was seen and examined on 11/03/2023 at 9:48 AM      Brief hospital course: 68 y.o. M with CAD, dCHF, PVD, DM, chronic weight loss presented with subdural hematoma.        Assessment and Plan: * Subdural hematoma (HCC) CT head on admission showed 4 mm subdural hematoma on the left, no midline shift.  Neurosurgery were consulted, Eliquis  was held, and repeat imaging showed stability.  Since then, Eliquis  has been restarted and he has remained stable at baseline. - PT/OT  Peripheral artery disease - Continue apixaban , atorvastatin   GAD (generalized anxiety disorder) - Continue paroxetine , quetiapine , mirtazapine   Stage ii Pressure injury sacrum, present on admission - Barrier pad  Possible UTI Treated with antibitoics, completed 5 days  AKI (acute kidney injury) CKD ruled out Cr 1.4, now normalized to 0.8-0.9 - Continue losartan  - Hold spironolactone , Lasix  given orthostasis  Chronic diastolic CHF (congestive heart failure) (HCC) No evidence of fluid overload.  Orthostatic, and seems prone to inadequate fluid intake, so we will stop diuretics - Stop furosemide  and spironolactone   Protein-calorie malnutrition, severe As evidenced by severe diffuse loss of subcutaneous muscel mass and fat - Continue Ensure, MVI - Continue mirtazapine  - Consult dietitian  Major depressive disorder, recurrent episode, moderate (HCC)    Paroxysmal atrial fibrillation (HCC) - Continue apixaban   Coronary artery disease involving bypass graft of transplanted heart without angina pectoris - Continue apixaban  - Continue atorvastatin , losartan   Hyperlipidemia LDL goal <70 - Continue atorvastatin   Normocytic anemia Hemoglobin stably low in the 7-8 range.  No clinical bleeding observed or reported.  Recent B12 and folate normal.  Ferritin relatively low in August - Check  iron  studies.  S/P CABG (coronary artery bypass graft)            Subjective: Patient has no complaints, he is lying in bed, eating breakfast.  Nursing of no concerns.  He does not appear to be getting out of bed much.      Physical Exam: BP 120/70 (BP Location: Left Arm)   Pulse (!) 49   Temp 97.9 F (36.6 C) (Oral)   Resp 20   Ht 6' 2 (1.88 m)   Wt 65.5 kg   SpO2 99%   BMI 18.54 kg/m   Thin frail elderly adult male, lying in bed, eating breakfast Heart rate slow, regular, no murmurs Lungs clear to auscultation bilaterally, no wheezing or crackles Abdomen soft, no distention or tenderness, bowel sounds normal Sitting up in bed, interactive and appropriate, oriented to person, place, and time, short-term memory seems normal.  He has generalized weakness, but symmetric strength, fluent speech  Data Reviewed: Basic metabolic panel shows slightly elevated BUN to creatinine ratio, creatinine normal CBC shows persistent anemia     Family Communication:     Disposition: Status is: Inpatient         Author: Lonni SHAUNNA Dalton, MD 11/03/2023 1:35 PM  For on call review [email or web address].

## 2023-11-03 NOTE — Plan of Care (Signed)
  Problem: Fluid Volume: Goal: Ability to maintain a balanced intake and output will improve Outcome: Progressing   Problem: Health Behavior/Discharge Planning: Goal: Ability to identify and utilize available resources and services will improve Outcome: Progressing   Problem: Skin Integrity: Goal: Risk for impaired skin integrity will decrease Outcome: Progressing   Problem: Nutrition: Goal: Adequate nutrition will be maintained Outcome: Progressing   Problem: Skin Integrity: Goal: Risk for impaired skin integrity will decrease Outcome: Progressing

## 2023-11-04 DIAGNOSIS — S065XAA Traumatic subdural hemorrhage with loss of consciousness status unknown, initial encounter: Secondary | ICD-10-CM | POA: Diagnosis not present

## 2023-11-04 LAB — IRON AND TIBC
Iron: 22 ug/dL — ABNORMAL LOW (ref 45–182)
Saturation Ratios: 11 % — ABNORMAL LOW (ref 17.9–39.5)
TIBC: 204 ug/dL — ABNORMAL LOW (ref 250–450)
UIBC: 182 ug/dL

## 2023-11-04 LAB — GLUCOSE, CAPILLARY
Glucose-Capillary: 108 mg/dL — ABNORMAL HIGH (ref 70–99)
Glucose-Capillary: 183 mg/dL — ABNORMAL HIGH (ref 70–99)
Glucose-Capillary: 210 mg/dL — ABNORMAL HIGH (ref 70–99)
Glucose-Capillary: 144 mg/dL — ABNORMAL HIGH (ref 70–99)

## 2023-11-04 LAB — FERRITIN: Ferritin: 172 ng/mL (ref 24–336)

## 2023-11-04 NOTE — Inpatient Diabetes Management (Signed)
 Inpatient Diabetes Program Recommendations  AACE/ADA: New Consensus Statement on Inpatient Glycemic Control (2015)  Target Ranges:  Prepandial:   less than 140 mg/dL      Peak postprandial:   less than 180 mg/dL (1-2 hours)      Critically ill patients:  140 - 180 mg/dL   Lab Results  Component Value Date   GLUCAP 108 (H) 11/04/2023   HGBA1C 9.1 (H) 07/26/2023    Review of Glycemic Control  Latest Reference Range & Units 11/03/23 08:02 11/03/23 08:38 11/03/23 12:25 11/03/23 16:05 11/03/23 21:22 11/04/23 09:22  Glucose-Capillary 70 - 99 mg/dL 69 (L) 74 829 (H) 822 (H) 233 (H) 108 (H)  (L): Data is abnormally low (H): Data is abnormally high  Diabetes history: DM2 Outpatient Diabetes medications:  Glipizide  5 mg QAM and 2.5 mg QPM Januvia  100 mg QD Current orders for Inpatient glycemic control:  Novolog  0-20 units TID and 0-5 units at bedtime Semglee  8 units QD  Inpatient Diabetes Program Recommendations:    Might consider:  Semglee  6 units every day Novolog  0-15 units TID and 0-5 units at bedtime Novolog  4 units TID with meals if he consumes at least 50%  Thank you, Wyvonna Pinal, MSN, CDCES Diabetes Coordinator Inpatient Diabetes Program [phone] (team pager from 8a-5p)

## 2023-11-04 NOTE — TOC Progression Note (Signed)
 Transition of Care [facility]) - Progression Note    Patient Details  Name: [name] [name] MRN: [id number] Date of Birth: [birth year]  Transition of Care [facility]) CM/SW Contact  [name], [place] Phone Number: 11/04/2023, 11:30 AM  Clinical Narrative:     Received message on 11/1 @ 8:19 am - patient SNF authorization has been approved # [id number]- 5 business day to transfer.   Montie Louder, MSW, LCSW Clinical Social Worker    Expected Discharge Plan: Skilled Nursing Facility Barriers to Discharge: English As A Second Language Teacher, Continued Medical Work up, SNF Pending bed offer               Expected Discharge Plan and Services In-house Referral: Clinical Social Work                                             Social Drivers of Health (SDOH) Interventions SDOH Screenings   Food Insecurity: No Food Insecurity (10/23/2023)  Recent Concern: Food Insecurity - Food Insecurity Present (08/18/2023)  Housing: Low Risk  (10/23/2023)  Transportation Needs: No Transportation Needs (10/23/2023)  Utilities: Not At Risk (10/23/2023)  Alcohol Screen: Low Risk  (08/19/2023)  Depression (PHQ2-9): Medium Risk (07/26/2023)  Financial Resource Strain: Low Risk  (08/19/2023)  Physical Activity: Inactive (11/21/2022)  Social Connections: Socially Isolated (10/23/2023)  Stress: Stress Concern Present (11/21/2022)  Tobacco Use: High Risk (10/24/2023)  Health Literacy: Adequate Health Literacy (11/21/2022)    Readmission Risk Interventions    08/20/2023    4:20 PM  Readmission Risk Prevention Plan  Transportation Screening Complete  PCP or Specialist Appt within 3-5 Days Complete  HRI or Home Care Consult Complete  Palliative Care Screening Not Applicable  Medication Review (RN Care Manager) Complete

## 2023-11-04 NOTE — Progress Notes (Signed)
   11/04/23 1400  Spiritual Encounters  Type of Visit Initial;Declined chaplain visit    Chaplain responded to consult request for Major Life Transition. The patient stated that he is trying to rest right now. Chaplain make a follow up visit later.    M.Kubra Susanna Kerry Resident [phone]

## 2023-11-04 NOTE — Plan of Care (Signed)
  Problem: Fluid Volume: Goal: Ability to maintain a balanced intake and output will improve Outcome: Progressing   Problem: Health Behavior/Discharge Planning: Goal: Ability to identify and utilize available resources and services will improve Outcome: Progressing   Problem: Metabolic: Goal: Ability to maintain appropriate glucose levels will improve Outcome: Progressing   Problem: Nutritional: Goal: Maintenance of adequate nutrition will improve Outcome: Progressing   Problem: Skin Integrity: Goal: Risk for impaired skin integrity will decrease Outcome: Progressing

## 2023-11-04 NOTE — Progress Notes (Signed)
 PT Cancellation Note  Patient Details Name: [name] [name] MRN: [id number] DOB: 02/05/1955   Cancelled Treatment:    Reason Eval/Treat Not Completed: Other (comment). Attempted to see patient at 945am and patient requested to eat breakfast first. PT returned at 1300 and pt was soiled in loose stool. Pt unaware. PT alerted RN tech. PT to return as able to progress mobility.  Alexxa Sabet, PT, DPT Acute Rehabilitation Services Secure chat preferred Office #: [phone]    Norene CHRISTELLA Ames 11/04/2023, 2:29 PM

## 2023-11-04 NOTE — Plan of Care (Signed)
  Problem: Fluid Volume: Goal: Ability to maintain a balanced intake and output will improve Outcome: Progressing   Problem: Metabolic: Goal: Ability to maintain appropriate glucose levels will improve Outcome: Progressing   Problem: Nutritional: Goal: Maintenance of adequate nutrition will improve Outcome: Progressing   Problem: Skin Integrity: Goal: Risk for impaired skin integrity will decrease Outcome: Progressing   Problem: Clinical Measurements: Goal: Ability to maintain clinical measurements within normal limits will improve Outcome: Progressing Goal: Will remain free from infection Outcome: Progressing   Problem: Nutrition: Goal: Adequate nutrition will be maintained Outcome: Progressing   Problem: Coping: Goal: Level of anxiety will decrease Outcome: Progressing   Problem: Pain Managment: Goal: General experience of comfort will improve and/or be controlled Outcome: Progressing   Problem: Safety: Goal: Ability to remain free from injury will improve Outcome: Progressing   Problem: Skin Integrity: Goal: Risk for impaired skin integrity will decrease Outcome: Progressing   Problem: Health Behavior/Discharge Planning: Goal: Ability to manage health-related needs will improve Outcome: Not Progressing

## 2023-11-04 NOTE — TOC Progression Note (Addendum)
 Transition of Care [facility]) - Progression Note    Patient Details  Name: [name] [name] MRN: [id number] Date of Birth: 1955-04-29  [facility]) CM/SW Contact  Lauraine FORBES Saa, LCSWA Phone Number: 11/04/2023, 4:16 PM  Clinical Narrative:     4:16 PM CSW, RNCM, and MD discussed disposition with patient and patient's son, [name]. Patient informed TOC that he resides at home alone where he uses a rollator he paid for privately to assist with ambulation. Both were in agreement with patient discharging to SNF prior to transitioning to LTC. CSW informed patient that income, such as SSI or disability, would pay for LTC costs and checks would need to be forwarded. Patient expressed understanding of the information. [facility] SNF informed CSW that they had to rescind bed offer due to LTC needs. [facility] informed CSW that they do not currently have bed availability. [facility] SNF informed CSW that they could admit patient tomorrow. CSW provided SNF updates to [facility]. [name] was agreeable with patient discharging to [facility] and requested CSW to expand SNF search to [facility] as that is where he resides. CSW expanded SNF search and provided current [facility] SNF options [facility], [facility]) to [place]. CSW will continue to follow.  4:28 PM [name] confirmed interest in [facility] SNF. CSW changed patient's SNF for SNF insurance authorization. CSW will continue to follow.  Expected Discharge Plan: Skilled Nursing Facility Barriers to Discharge: Continued Medical Work up               Expected Discharge Plan and Services In-house Referral: Clinical Social Work   Post Acute Care Choice: Skilled Nursing Facility Living arrangements for the past 2 months: Single Family Home                                       Social Drivers of Health (SDOH) Interventions SDOH Screenings   Food Insecurity: No Food  Insecurity (10/23/2023)  Recent Concern: Food Insecurity - Food Insecurity Present (08/18/2023)  Housing: Low Risk  (10/23/2023)  Transportation Needs: No Transportation Needs (10/23/2023)  Utilities: Not At Risk (10/23/2023)  Alcohol Screen: Low Risk  (08/19/2023)  Depression (PHQ2-9): Medium Risk (07/26/2023)  Financial Resource Strain: Low Risk  (08/19/2023)  Physical Activity: Inactive (11/21/2022)  Social Connections: Socially Isolated (10/23/2023)  Stress: Stress Concern Present (11/21/2022)  Tobacco Use: High Risk (10/24/2023)  Health Literacy: Adequate Health Literacy (11/21/2022)    Readmission Risk Interventions    08/20/2023    4:20 PM  Readmission Risk Prevention Plan  Transportation Screening Complete  PCP or Specialist Appt within 3-5 Days Complete  HRI or Home Care Consult Complete  Palliative Care Screening Not Applicable  Medication Review (RN Care Manager) Complete

## 2023-11-04 NOTE — Progress Notes (Signed)
  Progress Note   Patient: [name] [name] FMW:[id number] DOB: 1955-11-21 DOA: 10/23/2023     12 DOS: the patient was seen and examined on 11/04/2023 at 8:08 AM      Brief hospital course: 68 y.o. M with CAD, dCHF, PVD, DM, chronic weight loss presented with subdural hematoma.        Assessment and Plan: * Subdural hematoma (HCC) CT head on admission showed 4 mm subdural hematoma on the left, no midline shift.  Neurosurgery were consulted, Eliquis  was held, and repeat imaging showed stability.  Since then, Eliquis  has been restarted and he has remained stable at baseline. - PT/OT  Peripheral artery disease - Continue apixaban , atorvastatin   GAD (generalized anxiety disorder) - Continue paroxetine , quetiapine , mirtazapine   Stage ii Pressure injury sacrum, present on admission - Barrier pad  Possible UTI Treated with antibitoics, completed 5 days  AKI (acute kidney injury) CKD ruled out Cr 1.4, now normalized to 0.8-0.9 - Continue losartan  - Hold spironolactone , Lasix  given orthostasis  Chronic diastolic CHF (congestive heart failure) (HCC) No evidence of fluid overload.  Orthostatic, and seems prone to inadequate fluid intake, so we will stop diuretics - Stop furosemide  and spironolactone   Protein-calorie malnutrition, severe As evidenced by severe diffuse loss of subcutaneous muscel mass and fat - Continue Ensure, MVI - Continue mirtazapine  - Consult dietitian  Major depressive disorder, recurrent episode, moderate (HCC)    Paroxysmal atrial fibrillation (HCC) - Continue apixaban   Coronary artery disease involving bypass graft of transplanted heart without angina pectoris - Continue apixaban  - Continue atorvastatin , losartan   Hyperlipidemia LDL goal <70 - Continue atorvastatin   Normocytic anemia Hemoglobin stably low in the 7-8 range.  No clinical bleeding observed or reported.  Recent B12 and folate normal.  Iron  studies here consistent with anemia of  chronic disease. -Periodic CBC  S/P CABG (coronary artery bypass graft)            Subjective: Doing okay, no new concerns.  Would like to go home.     Physical Exam: BP (!) 116/55 (BP Location: Left Arm)   Pulse (!) 55   Temp 98.7 F (37.1 C) (Oral)   Resp 17   Ht 6' 2 (1.88 m)   Wt 65.5 kg   SpO2 98%   BMI 18.54 kg/m   Thin frail elderly male, sitting up in bed, interactive and appropriate RRR, no murmurs, no peripheral edema Respiratory rate normal, no wheezing or crackles Abdomen soft, no distention or tenderness Severe loss of subcutaneous muscle mass and fat   Data Reviewed:     Family Communication: Son by phone    Disposition: Status is: Inpatient         Author: Lonni SHAUNNA Dalton, MD 11/04/2023 3:50 PM  For on call review [email or web address].

## 2023-11-05 DIAGNOSIS — S065XAA Traumatic subdural hemorrhage with loss of consciousness status unknown, initial encounter: Secondary | ICD-10-CM | POA: Diagnosis not present

## 2023-11-05 LAB — GLUCOSE, CAPILLARY
Glucose-Capillary: 147 mg/dL — ABNORMAL HIGH (ref 70–99)
Glucose-Capillary: 175 mg/dL — ABNORMAL HIGH (ref 70–99)
Glucose-Capillary: 181 mg/dL — ABNORMAL HIGH (ref 70–99)
Glucose-Capillary: 145 mg/dL — ABNORMAL HIGH (ref 70–99)

## 2023-11-05 MED ORDER — ONDANSETRON HCL 4 MG/2ML IJ SOLN
4.0000 mg | Freq: Four times a day (QID) | INTRAMUSCULAR | Status: DC | PRN
  Administered 2023-11-05: 4 mg via INTRAVENOUS
  Filled 2023-11-05: qty 2

## 2023-11-05 MED ORDER — JUVEN PO PACK
1.0000 | PACK | Freq: Two times a day (BID) | ORAL | Status: DC
  Administered 2023-11-05 – 2023-11-06 (×2): 1 via ORAL
  Filled 2023-11-05 (×2): qty 1

## 2023-11-05 NOTE — TOC Progression Note (Signed)
 Transition of Care [facility]) - Progression Note    Patient Details  Name: [name] [name] MRN: [id number] Date of Birth: [birth year]  Transition [facility] Care [facility]) CM/SW Contact  Lauraine FORBES Saa, LCSWA Phone Number: 11/05/2023, 1:32 PM  Clinical Narrative:     1:32 PM [facility] SNF informed CSW that they rescinded patient's SNF bed offer due to LTC needs. Patient's son and medical team made aware. CSW provided patient's son with additional bed offers ([facility], [facility]). Patient's son accepted bed offer at [facility]. SNF, medical team, and HTA made aware. [facility] SNF requested pictures of patient's wounds and confirmed they could admit patient tomorrow. CSW made medical team aware. CSW will continue to follow.  Expected Discharge Plan: Skilled Nursing Facility Barriers to Discharge: Continued Medical Work up               Expected Discharge Plan and Services In-house Referral: Clinical Social Work   Post Acute Care Choice: Skilled Nursing Facility Living arrangements for the past 2 months: Single Family Home                                       Social Drivers of Health (SDOH) Interventions SDOH Screenings   Food Insecurity: No Food Insecurity (10/23/2023)  Recent Concern: Food Insecurity - Food Insecurity Present (08/18/2023)  Housing: Low Risk  (10/23/2023)  Transportation Needs: No Transportation Needs (10/23/2023)  Utilities: Not At Risk (10/23/2023)  Alcohol Screen: Low Risk  (08/19/2023)  Depression (PHQ2-9): Medium Risk (07/26/2023)  Financial Resource Strain: Low Risk  (08/19/2023)  Physical Activity: Inactive (11/21/2022)  Social Connections: Socially Isolated (10/23/2023)  Stress: Stress Concern Present (11/21/2022)  Tobacco Use: High Risk (10/24/2023)  Health Literacy: Adequate Health Literacy (11/21/2022)    Readmission Risk Interventions    08/20/2023    4:20 PM  Readmission Risk Prevention Plan   Transportation Screening Complete  PCP or Specialist Appt within 3-5 Days Complete  HRI or Home Care Consult Complete  Palliative Care Screening Not Applicable  Medication Review (RN Care Manager) Complete

## 2023-11-05 NOTE — Progress Notes (Deleted)
  Cardiology Office Note:  .   Date:  11/05/2023  ID:  Ethel Jama Saba, DOB 01/06/1955, MRN [id number] PCP: [name] HERO, PA-C   [facility] Providers Cardiologist:  Darryle ONEIDA Decent, MD { Click to update primary MD,subspecialty MD or APP then REFRESH:1}   History of Present Illness: .   No chief complaint on file.   [name] [name] is a 68 y.o. male with history of CAD, HFpEF, HTN, DM, HLD who presents for follow-up.      Problem List PAD -R PT stenosis -> balloon 11/2020 -L peroneal artery -> balloon/recurrent stenosis 11/2020 2. CAD s/p CABG -09/30/2001 CABG x 4 3. Persistent Afib  4. DM -A1c 9.1 5. HTN 6. HFpEF 7. HLD -T chol 78, HDL 39, LDL 34, TG 26    ROS: All other ROS reviewed and negative. Pertinent positives noted in the HPI.     Studies Reviewed: SABRA       TTE 10/24/2023  1. Left ventricular ejection fraction, by estimation, is 60 to 65%. The  left ventricle has normal function. The left ventricle has no regional  wall motion abnormalities. There is mild asymmetric left ventricular  hypertrophy of the basal-septal segment.  Left ventricular diastolic function could not be evaluated.   2. Right ventricular systolic function is normal. The right ventricular  size is normal.   3. The mitral valve is normal in structure. No evidence of mitral valve  regurgitation. No evidence of mitral stenosis.   4. The aortic valve is tricuspid. There is severe calcifcation of the  aortic valve. Aortic valve regurgitation is not visualized. Aortic valve  sclerosis/calcification is present, without any evidence of aortic  stenosis.   5. Aortic dilatation noted. There is mild dilatation of the ascending  aorta, measuring 42 mm.   6. The inferior vena cava is normal in size with greater than 50%  respiratory variability, suggesting right atrial pressure of 3 mmHg.   7. Agitated saline contrast bubble study was negative, with no evidence  of any interatrial  shunt.  Physical Exam:   VS:  There were no vitals taken for this visit.   Wt Readings from Last 3 Encounters:  10/24/23 144 lb 6.4 oz (65.5 kg)  09/30/23 146 lb (66.2 kg)  09/26/23 147 lb (66.7 kg)    GEN: Well nourished, well developed in no acute distress NECK: No JVD; No carotid bruits CARDIAC: ***RRR, no murmurs, rubs, gallops RESPIRATORY:  Clear to auscultation without rales, wheezing or rhonchi  ABDOMEN: Soft, non-tender, non-distended EXTREMITIES:  No edema; No deformity  ASSESSMENT AND PLAN: .   ***    {Are you ordering a CV Procedure (e.g. stress test, cath, DCCV, TEE, etc)?   Press F2        :[id number]}   Follow-up: No follow-ups on file.  Time Spent with Patient: I have spent a total of *** minutes caring for this patient today face to face, ordering and reviewing labs/tests, reviewing prior records/medical history, examining the patient, establishing an assessment and plan, communicating results/findings to the patient/family, and documenting in the medical record.   Signed, Darryle ONEIDA. Decent, MD, [facility]  [facility]  [address] [place], [place] [address] [phone]  8:34 AM

## 2023-11-05 NOTE — Progress Notes (Signed)
 Patient refused bath. [name] [name]

## 2023-11-05 NOTE — Plan of Care (Signed)
  Problem: Fluid Volume: Goal: Ability to maintain a balanced intake and output will improve Outcome: Progressing   Problem: Nutritional: Goal: Maintenance of adequate nutrition will improve Outcome: Progressing   Problem: Skin Integrity: Goal: Risk for impaired skin integrity will decrease Outcome: Progressing   Problem: Clinical Measurements: Goal: Will remain free from infection Outcome: Progressing   Problem: Pain Managment: Goal: General experience of comfort will improve and/or be controlled Outcome: Progressing   Problem: Safety: Goal: Ability to remain free from injury will improve Outcome: Progressing   Problem: Education: Goal: Ability to describe self-care measures that may prevent or decrease complications (Diabetes Survival Skills Education) will improve Outcome: Not Progressing   Problem: Health Behavior/Discharge Planning: Goal: Ability to manage health-related needs will improve Outcome: Not Progressing

## 2023-11-05 NOTE — Progress Notes (Signed)
 OT Cancellation Note  Patient Details Name: [name] [name] MRN: [id number] DOB: 1955/07/29   Cancelled Treatment:    Reason Eval/Treat Not Completed: (P) Patient declined, lunch arrived, wants to eat, asked to return later  [name] 11/05/2023, 12:48 PM

## 2023-11-05 NOTE — Progress Notes (Addendum)
 Nutrition Follow-up  DOCUMENTATION CODES:   Severe malnutrition in context of chronic illness  INTERVENTION:  Encourage PO intake - On carb modified diet, thin liquids Room service with assist  Consider liberalizing diet if po intake declines Increase Ensure Plus High Protein po to TID, each supplement provides 350 kcal and 20 grams of protein Magic cup TID with meals, each supplement provides 290 kcal and 9 grams of protein *Prefers chocolate  1 packet Juven BID, each packet provides 95 calories, 2.5 grams of protein (collagen) to support wound healing Continue MVI, and B12 supplementation daily   NUTRITION DIAGNOSIS:   Severe Malnutrition related to chronic illness as evidenced by severe muscle depletion, severe fat depletion. - Ongoing  GOAL:   Patient will meet greater than or equal to 90% of their needs, Weight gain - Progressing   MONITOR:   PO intake, Supplement acceptance, Labs, Weight trends  REASON FOR ASSESSMENT:   Consult Assessment of nutrition requirement/status  ASSESSMENT:   68 y/o male presenting to [facility] on 10/22 after fall. Imaging found L SDH, 4 mm shift. Potential UTI. EEG negative. PMH includes: PAF, chronic bil PE, CHF, CAD s/p CABG, PAD, DM2, HLD, HTN.  Per chart review, Pt has fluctuating AMS and noted he seems appropriate to answer questions but a lot of time his stories are not real. Pt is a poor historian at times.    Pt resting in bed on assessment, awake and alert, appears cachectic, no family bedside, pt repots improving appetite he states my appetite came out of nowhere. Pt on Remeron  which seems to be helping. No N/V, or abdominal pain. Has been eating 100% of his meals since admission. This morning pt stated he had 100% of 2 breakfast trays however on [employer] pt only received 1 meal tray. Per RN had 100% of his meal tray.   Pt states he really enjoys the Ensures and has been drinking 2 per day. Confirmed with nursing. Will increase to TID  to promote weight gain. Noted stage 2 pressure injury on sacrum, add Juven for wound healing. Encourage po intake. Weight increasing + 2 lbs since admission.   Dispo: SNF  Admit weight: 65.5 kg Current weight: 66.3 kg   Average Meal Intake: 10/27-11/4: 92% intake x 7 recorded meals  Nutritionally Relevant Medications: Scheduled Meds:  vitamin B-12  1,000 mcg Oral Daily   feeding supplement  237 mL Oral BID BM   insulin  aspart  0-20 Units Subcutaneous TID WC   insulin  aspart  0-5 Units Subcutaneous QHS   insulin  glargine-yfgn  8 Units Subcutaneous Daily   mirtazapine   15 mg Oral QHS   multivitamin with minerals  1 tablet Oral Daily   nutrition supplement (JUVEN)  1 packet Oral BID BM   Labs Reviewed: BUN 33 AP 157 CBG ranges from 144-147 mg/dL over the last 24 hours HgbA1c 9.1  Diet Order:   Diet Order             Diet Carb Modified Fluid consistency: Thin; Room service appropriate? Yes with Assist  Diet effective now                   EDUCATION NEEDS:   Not appropriate for education at this time  Skin:  Skin Assessment: Skin Integrity Issues: Skin Integrity Issues:: Stage II Stage II: Pressure injruy sacrum  Last BM:  11/3 - x1, type 7  Height:   Ht Readings from Last 1 Encounters:  10/24/23 6' 2 (1.88 m)  Weight:   Wt Readings from Last 1 Encounters:  10/24/23 65.5 kg    Ideal Body Weight:  86.4 kg  BMI:  Body mass index is 18.54 kg/m.  Estimated Nutritional Needs:   Kcal:  1900-2100 kcal  Protein:  80-100 gm  Fluid:  >1.9L/day   Olivia Kenning, RD Registered Dietitian  See Amion for more information

## 2023-11-05 NOTE — Progress Notes (Signed)
 Physical Therapy Treatment  Patient Details Name: [name] [name] MRN: [id number] DOB: 17-Nov-1955 Today's Date: 11/05/2023   History of Present Illness Pt is a 68 y/o male presenting to [facility] on 10/22 after fall.  Imaging found L SDH, 4 mm shift.  Potential UTI. EEG negative. +orthostasis  PMH includes: PAF, chronic bil PE, CHF, CAD s/p CABG, PAD, DM2, chronic decubitus ulcer.    PT Comments  Pt more alert and participatory this date. Pt continues to have noted confusion, delayed processing, impaired memory, difficulty sequencing, impaired problem solving, and requires min/modA for ADLs and for ambulation with use of RW. Pt with difficulty clearing bilat feet and demo'd more of a shuffling gait pattern. Pt unsafe to return home alone as patient at very high fall risk and demo's cognitive impairments suggesting pt would not be efficient or effective in caring for self while home alone. Recommending inpatient rehab program < 3 hrs a day to address above deficits to progress towards safe mod I level of function however ultimately may need LTC. Acute PT to cont to follow.    If plan is discharge home, recommend the following: Two people to help with walking and/or transfers;Assistance with cooking/housework;Assist for transportation;Help with stairs or ramp for entrance   Can travel by private vehicle     No  Equipment Recommendations  None recommended by PT    Recommendations for Other Services       Precautions / Restrictions Precautions Precautions: Fall Recall of Precautions/Restrictions: Impaired Restrictions Weight Bearing Restrictions Per Provider Order: No     Mobility  Bed Mobility Overal bed mobility: Needs Assistance Bed Mobility: Rolling, Sidelying to Sit, Sit to Supine Rolling: Mod assist, Used rails Sidelying to sit: HOB elevated, Used rails, Mod assist       General bed mobility comments: maximal multimodal directional cues, modA to complete roll to sidelying and  for trunk elevation    Transfers Overall transfer level: Needs assistance Equipment used: Rolling walker (2 wheels) Transfers: Sit to/from Stand Sit to Stand: Min assist           General transfer comment: minA to power up from elevated surface, increased time, tremory    Ambulation/Gait Ambulation/Gait assistance: Editor, Commissioning (Feet): 10 Feet (from one side of bed to the other) Assistive device: Rolling walker (2 wheels) Gait Pattern/deviations: Step-to pattern, Decreased stride length, Decreased dorsiflexion - right, Knee flexed in stance - right, Knee flexed in stance - left, Trunk flexed Gait velocity: dec     General Gait Details: max verbal cues to stay in walker, minA for walker management, pt with decreased ability to clear R foot, trunk flexion and bilat knee flexion t/o amb   Stairs             Wheelchair Mobility     Tilt Bed    Modified Rankin (Stroke Patients Only)       Balance Overall balance assessment: Needs assistance Sitting-balance support: No upper extremity supported, Feet supported Sitting balance-Leahy Scale: Fair Sitting balance - Comments: pt attempted to don socks however unable to reach foot or bring foot to knee. maxA for donning of socks   Standing balance support: During functional activity, Reliant on assistive device for balance, Bilateral upper extremity supported Standing balance-Leahy Scale: Poor Standing balance comment: reliant on RW                            Communication Communication Communication: Impaired  Factors Affecting Communication: Hearing impaired  Cognition Arousal: Alert Behavior During Therapy: Flat affect   PT - Cognitive impairments: No family/caregiver present to determine baseline                       PT - Cognition Comments: noted poor short term memory, pt converstant however not always applicable to task at hand vs tangential. constant directional verbal cues  to sequence tasks Following commands: Impaired Following commands impaired: Follows one step commands with increased time    Cueing Cueing Techniques: Verbal cues  Exercises      General Comments General comments (skin integrity, edema, etc.): VSS      Pertinent Vitals/Pain Pain Assessment Pain Assessment: No/denies pain    Home Living                          Prior Function            PT Goals (current goals can now be found in the care plan section) Acute Rehab PT Goals Patient Stated Goal: none stated PT Goal Formulation: With patient Time For Goal Achievement: 11/07/23 Potential to Achieve Goals: Good Progress towards PT goals: Progressing toward goals    Frequency    Min 2X/week      PT Plan      Co-evaluation              AM-PAC PT 6 Clicks Mobility   Outcome Measure  Help needed turning from your back to your side while in a flat bed without using bedrails?: A Lot Help needed moving from lying on your back to sitting on the side of a flat bed without using bedrails?: A Lot Help needed moving to and from a bed to a chair (including a wheelchair)?: A Lot Help needed standing up from a chair using your arms (e.g., wheelchair or bedside chair)?: A Little Help needed to walk in hospital room?: Total (distnace) Help needed climbing 3-5 steps with a railing? : Total 6 Click Score: 11    End of Session Equipment Utilized During Treatment: Gait belt Activity Tolerance: Patient tolerated treatment well Patient left: with call bell/phone within reach;in bed;with bed alarm set;with SCD's reapplied (HOB elevated 40 degrees) Nurse Communication: Mobility status (condom catheter came off) PT Visit Diagnosis: Unsteadiness on feet (R26.81);Other abnormalities of gait and mobility (R26.89);Muscle weakness (generalized) (M62.81);History of falling (Z91.81);Dizziness and giddiness (R42)     Time: 1010-1030 PT Time Calculation (min) (ACUTE ONLY): 20  min  Charges:    $Gait Training: 8-22 mins PT General Charges $$ ACUTE PT VISIT: 1 Visit                     Norene Ames, PT, DPT Acute Rehabilitation Services Secure chat preferred Office #: [phone]    Norene CHRISTELLA Ames 11/05/2023, 10:46 AM

## 2023-11-05 NOTE — Progress Notes (Signed)
  Progress Note   Patient: [name] [name] FMW:[id number] DOB: Oct 09, 1955 DOA: 10/23/2023     13 DOS: the patient was seen and examined on 11/05/2023 at 11:35AM      Brief hospital course: 68 y.o. M with CAD, dCHF, PVD, DM, chronic weight loss presented with subdural hematoma.        Assessment and Plan: * Subdural hematoma (HCC) CT head on admission showed 4 mm subdural hematoma on the left, no midline shift.  Neurosurgery were consulted, Eliquis  was held, and repeat imaging showed stability.  Since then, Eliquis  has been restarted and he has remained stable at baseline. - PT/OT  Peripheral artery disease - Continue apixaban , atorvastatin   GAD (generalized anxiety disorder) - Continue paroxetine , quetiapine , mirtazapine   Stage ii Pressure injury sacrum, present on admission - Clean with soap and water - Apply foam barrier dressing - Pressure redistribution pad when out of bed Image below:    Possible UTI Treated with antibitoics, completed 5 days  AKI (acute kidney injury) CKD ruled out Cr 1.4, now normalized to 0.8-0.9 - Continue losartan  - Hold spironolactone , Lasix  given orthostasis  Chronic diastolic CHF (congestive heart failure) (HCC) No evidence of fluid overload.  Orthostatic, and seems prone to inadequate fluid intake, so we will stop diuretics - Stop furosemide  and spironolactone   Protein-calorie malnutrition, severe As evidenced by severe diffuse loss of subcutaneous muscel mass and fat - Continue Ensure, MVI - Continue mirtazapine  - Consult dietitian  Major depressive disorder, recurrent episode, moderate (HCC)    Paroxysmal atrial fibrillation (HCC) - Continue apixaban   Coronary artery disease involving bypass graft of transplanted heart without angina pectoris - Continue apixaban  - Continue atorvastatin , losartan   Hyperlipidemia LDL goal <70 - Continue atorvastatin   Normocytic anemia Hemoglobin stably low in the 7-8 range.  No  clinical bleeding observed or reported.  Recent B12 and folate normal.  Iron  studies here consistent with anemia of chronic disease. -Periodic CBC  S/P CABG (coronary artery bypass graft)            Subjective: No new concerns, no nursing concerns.     Physical Exam: BP (!) 119/59 (BP Location: Left Arm)   Pulse (!) 49   Temp 97.7 F (36.5 C) (Oral)   Resp 16   Ht 6' 2 (1.88 m)   Wt 66.3 kg   SpO2 98%   BMI 18.77 kg/m   Thin frail elderly male, sitting up in bed, interactive and appropriate RRR, no murmurs, no peripheral edema Respiratory rate normal, no wheezing or crackles Abdomen soft, no distention or tenderness Severe loss of subcutaneous muscle mass and fat   Data Reviewed:     Family Communication:      Disposition: Status is: Inpatient         Author: Lonni SHAUNNA Dalton, MD 11/05/2023 3:35 PM  For on call review [email or web address].

## 2023-11-06 DIAGNOSIS — S065XAA Traumatic subdural hemorrhage with loss of consciousness status unknown, initial encounter: Secondary | ICD-10-CM | POA: Diagnosis not present

## 2023-11-06 LAB — GLUCOSE, CAPILLARY
Glucose-Capillary: 139 mg/dL — ABNORMAL HIGH (ref 70–99)
Glucose-Capillary: 137 mg/dL — ABNORMAL HIGH (ref 70–99)

## 2023-11-06 MED ORDER — CYANOCOBALAMIN 1000 MCG PO TABS: 1000.0000 ug | ORAL_TABLET | Freq: Every day | ORAL | Status: DC

## 2023-11-06 MED ORDER — AMLODIPINE BESYLATE 10 MG PO TABS: 10.0000 mg | ORAL_TABLET | Freq: Every day | ORAL | Status: DC

## 2023-11-06 MED ORDER — ADULT MULTIVITAMIN W/MINERALS CH: 1.0000 | ORAL_TABLET | Freq: Every day | ORAL | Status: DC

## 2023-11-06 MED ORDER — LOSARTAN POTASSIUM 100 MG PO TABS: 100.0000 mg | ORAL_TABLET | Freq: Every day | ORAL | Status: DC

## 2023-11-06 MED ORDER — ENSURE PLUS HIGH PROTEIN PO LIQD: 237.0000 mL | Freq: Two times a day (BID) | ORAL | Status: DC

## 2023-11-06 MED ORDER — JUVEN PO PACK: 1.0000 | PACK | Freq: Two times a day (BID) | ORAL | Status: DC

## 2023-11-06 NOTE — Progress Notes (Signed)
 Report called to receiving facility and given to [facility], LPN. Patient awaiting transport at this time.

## 2023-11-06 NOTE — TOC Transition Note (Signed)
 Transition of Care [facility]) - Discharge Note   Patient Details  Name: [name] [name] MRN: [id number] Date of Birth: 1955/02/26  [facility]) CM/SW Contact:  Lauraine FORBES Saa, LCSWA Phone Number: 11/06/2023, 2:03 PM   Clinical Narrative:     Patient will DC to: [facility] Anticipated DC date: 11/06/2023 Family notified: Santi Troung; Son; [phone] Transport by: [facility]   Per MD patient ready for DC to [facility]. RN to call report prior to discharge [phone]). RN, patient, patient's family, and facility notified of DC. Discharge Summary and FL2 sent to facility. DC packet on chart. Ambulance transport requested for patient at 14:00.  CSW will sign off for now as social work intervention is no longer needed. Please consult us  again if new needs arise.    Final next level of care: Skilled Nursing Facility Barriers to Discharge: Barriers Resolved   Patient Goals and CMS Choice Patient states their goals for this hospitalization and ongoing recovery are:: SNF          Discharge Placement              Patient chooses bed at: [facility] Patient to be transferred to facility by: [facility] Name of family member notified: Sotero Brinkmeyer; Son; [phone] Patient and family notified of of transfer: 11/06/23  Discharge Plan and Services Additional resources added to the After Visit Summary for   In-house Referral: Clinical Social Work   Post Acute Care Choice: Skilled Nursing Facility                               Social Drivers of Health (SDOH) Interventions SDOH Screenings   Food Insecurity: No Food Insecurity (10/23/2023)  Recent Concern: Food Insecurity - Food Insecurity Present (08/18/2023)  Housing: Low Risk  (10/23/2023)  Transportation Needs: No Transportation Needs (10/23/2023)  Utilities: Not At Risk (10/23/2023)  Alcohol Screen: Low Risk  (08/19/2023)  Depression (PHQ2-9): Medium Risk (07/26/2023)   Financial Resource Strain: Low Risk  (08/19/2023)  Physical Activity: Inactive (11/21/2022)  Social Connections: Socially Isolated (10/23/2023)  Stress: Stress Concern Present (11/21/2022)  Tobacco Use: High Risk (10/24/2023)  Health Literacy: Adequate Health Literacy (11/21/2022)     Readmission Risk Interventions    08/20/2023    4:20 PM  Readmission Risk Prevention Plan  Transportation Screening Complete  PCP or Specialist Appt within 3-5 Days Complete  HRI or Home Care Consult Complete  Palliative Care Screening Not Applicable  Medication Review (RN Care Manager) Complete

## 2023-11-06 NOTE — Progress Notes (Signed)
   11/06/23 1030  Spiritual Encounters  Type of Visit Follow up  Care provided to: Pt not available    Chaplain followed up on spiritual care consult. Pt Dorn was sleeping and could only remain awake for short periods of time. I allowed him to rest and assured him that chaplains remain available. I did not get the sense that Dorn desires a chaplain visit. Consulted with the RN afterwards.   Clearing the current consult for now - please place another consult or reach out via page [phone]) if Dorn does indeed express interest in a chaplain visit, or as urgent needs arise.

## 2023-11-06 NOTE — Discharge Summary (Signed)
 Physician Discharge Summary   Patient: [name] [name] MRN: [id number] DOB: 1955/11/02  Admit date:     10/23/2023  Discharge date: 11/06/23  Discharge Physician: Lonni SHAUNNA Dalton   PCP: [name] HERO, PA-C     Recommendations at discharge:  Check CBC and BMP in 1 week (Cr 0.8, K 4.5, WBC 6.4 and Hgb 8.3 at discharge) Start oral iron  and docusate and check iron  studies in 3 months Schedule patient to follow up with [facility] in 1-2 weeks Trend MMSE, SLUMS or MOCA for impairment     Discharge Diagnoses: Principal Problem:   Subdural hematoma (HCC) Active Problems:   Peripheral artery disease   GAD (generalized anxiety disorder)   S/P CABG (coronary artery bypass graft)   Normocytic anemia   Hyperlipidemia LDL goal <70   Coronary artery disease involving bypass graft of transplanted heart without angina pectoris   Paroxysmal atrial fibrillation (HCC)   Major depressive disorder, recurrent episode, moderate (HCC)   Protein-calorie malnutrition, severe   Chronic diastolic CHF (congestive heart failure) (HCC)   AKI (acute kidney injury)   Hypomagnesemia   Possible UTI   Stage ii Pressure injury sacrum, present on admission      Hospital Course: 68 y.o. M with CAD, dCHF, PVD, DM, chronic weight loss presented with subdural hematoma.         * Subdural hematoma (HCC) CT head on admission showed 4 mm subdural hematoma on the left, no midline shift.  Neurosurgery were consulted, Eliquis  was held, and repeat imaging showed stability.  Since then, Eliquis  was restarted and he remained stable at baseline.  Cognitive impairment Patient's short term memory seems impaired, mildly.  He is oriented x4, and when given very concrete choices, he can articulate risks and benefits of different treatment options, but his abstract reasoning is impaired and he should not be asked to perform complex reasoning without his son's input.   - Recommend MMSE testing  serially   Peripheral artery disease On apixaban , atorvastatin   GAD (generalized anxiety disorder) On paroxetine , quetiapine   Stage ii Pressure injury sacrum, present on admission Utilize barrier foam pad daily  Possible UTI Treated with antibiotics, completed 5 days  AKI (acute kidney injury) CKD ruled out Cr 1.4 on admission, treated with fluids, normalized to 0.8-0.9.  Losartan  resumed, diuretics spironolactone  and Lasix  held given orthostasis.     Chronic diastolic CHF (congestive heart failure) (HCC) No evidence of fluid overload.  Orthostatic, and seems prone to inadequate fluid intake, so diuretics were stopped  Protein-calorie malnutrition, severe On Ensure, multivitamin and mirtazapine .   - Dietitian to follow at LTC   Major depressive disorder, recurrent episode, moderate (HCC)   Paroxysmal atrial fibrillation (HCC) Stable on apixaban   Coronary artery disease involving bypass graft of transplanted heart without angina pectoris Stable on apixaban , atorvastatin , losartan   Hyperlipidemia LDL goal <70 On atorvastatin   Normocytic anemia Hemoglobin stably low in the 7-8 range.  No clinical bleeding observed or reported.  Recent B12 and folate normal.  Iron  studies here consistent with anemia of chronic disease. -Start iron  at SNF  S/P CABG (coronary artery bypass graft)              The [place]  Controlled Substances Registry was reviewed for this patient prior to discharge.     Disposition: Skilled nursing facility Diet recommendation:  Cardiac diet  DISCHARGE MEDICATION: Allergies as of 11/06/2023       Reactions   Other Hives, Itching   Wool fabric -  hives, itching        Medication List     STOP taking these medications    furosemide  20 MG tablet Commonly known as: LASIX    IMODIUM  PO   OVER THE COUNTER MEDICATION   spironolactone  25 MG tablet Commonly known as: ALDACTONE        TAKE these medications     acetaminophen  500 MG tablet Commonly known as: TYLENOL  Take 500 mg by mouth as needed for mild pain (pain score 1-3) or moderate pain (pain score 4-6).   amLODipine  10 MG tablet Commonly known as: NORVASC  Take 1 tablet (10 mg total) by mouth daily. Start taking on: November 07, 2023   atorvastatin  40 MG tablet Commonly known as: LIPITOR Take 1 tablet (40 mg total) by mouth daily.   Claritin  10 MG tablet Generic drug: loratadine  Take 10 mg by mouth daily as needed for allergies.   cyanocobalamin  1000 MCG tablet Take 1 tablet (1,000 mcg total) by mouth daily. Start taking on: November 07, 2023   Eliquis  5 MG Tabs tablet Generic drug: apixaban  TAKE 1 TABLET BY MOUTH TWICE A DAY   feeding supplement Liqd Take 237 mLs by mouth 2 (two) times daily between meals.   nutrition supplement (JUVEN) Pack Take 1 packet by mouth 2 (two) times daily between meals.   glipiZIDE  5 MG tablet Commonly known as: GLUCOTROL  Take 1 tablet (5 mg total) by mouth 2 (two) times daily before a meal. What changed:  how much to take additional instructions   losartan  100 MG tablet Commonly known as: COZAAR  Take 1 tablet (100 mg total) by mouth daily. Start taking on: November 07, 2023 What changed:  medication strength how much to take   mirtazapine  15 MG tablet Commonly known as: REMERON  TAKE 1 TABLET BY MOUTH EVERYDAY AT BEDTIME   multivitamin with minerals Tabs tablet Take 1 tablet by mouth daily. Start taking on: November 07, 2023   PARoxetine  20 MG tablet Commonly known as: PAXIL  Take 20 mg by mouth daily.   QUEtiapine  25 MG tablet Commonly known as: SEROQUEL  Take 25 mg by mouth at bedtime.   sitaGLIPtin  100 MG tablet Commonly known as: JANUVIA  Take 100 mg by mouth daily.   Vitamin D3 50 MCG (2000 UT) capsule Take 4,000 Units by mouth daily.        Contact information for after-discharge care     Destination     [facility] .    Service: Skilled Nursing Contact information: [address] [place]  [address] [phone]                       Discharge Exam: Filed Weights   10/24/23 0142 11/05/23 1423  Weight: 65.5 kg 66.3 kg    General: Pt is alert, awake, not in acute distress Cardiovascular: RRR, nl S1-S2, no murmurs appreciated.   No LE edema.   Respiratory: Normal respiratory rate and rhythm.  CTAB without rales or wheezes. Abdominal: Abdomen soft and non-tender.  No distension or HSM.   Neuro/Psych: Strength symmetric in upper and lower extremities.  Judgment and insight appear impaired but at baseline.   Condition at discharge: fair  The results of significant diagnostics from this hospitalization (including imaging, microbiology, ancillary and laboratory) are listed below for reference.   Imaging Studies: ECHOCARDIOGRAM COMPLETE BUBBLE STUDY Result Date: 10/24/2023    ECHOCARDIOGRAM REPORT   Patient Name:   EVERET FLAGG Date of Exam: 10/24/2023 Medical  Rec #:  [id number]            Height:       74.0 in Accession #:    [id number]           Weight:       144.4 lb Date of Birth:  Sep 25, 1955             BSA:          1.892 m Patient Age:    68 years             BP:           105/67 mmHg Patient Gender: M                    HR:           49 bpm. Exam Location:  Inpatient Procedure: 2D Echo and Saline Contrast Bubble Study (Both Spectral and Color            Flow Doppler were utilized during procedure). Indications:    Sycope  History:        Patient has prior history of Echocardiogram examinations. CAD;                 Signs/Symptoms:Syncope.  Sonographer:    Norleen Amour Referring Phys: [id number] MARSHA ADA IMPRESSIONS  1. Left ventricular ejection fraction, by estimation, is 60 to 65%. The left ventricle has normal function. The left ventricle has no regional wall motion abnormalities. There is mild asymmetric left ventricular hypertrophy of the basal-septal segment. Left  ventricular diastolic function could not be evaluated.  2. Right ventricular systolic function is normal. The right ventricular size is normal.  3. The mitral valve is normal in structure. No evidence of mitral valve regurgitation. No evidence of mitral stenosis.  4. The aortic valve is tricuspid. There is severe calcifcation of the aortic valve. Aortic valve regurgitation is not visualized. Aortic valve sclerosis/calcification is present, without any evidence of aortic stenosis.  5. Aortic dilatation noted. There is mild dilatation of the ascending aorta, measuring 42 mm.  6. The inferior vena cava is normal in size with greater than 50% respiratory variability, suggesting right atrial pressure of 3 mmHg.  7. Agitated saline contrast bubble study was negative, with no evidence of any interatrial shunt. FINDINGS  Left Ventricle: Left ventricular ejection fraction, by estimation, is 60 to 65%. The left ventricle has normal function. The left ventricle has no regional wall motion abnormalities. The left ventricular internal cavity size was normal in size. There is  mild asymmetric left ventricular hypertrophy of the basal-septal segment. Left ventricular diastolic function could not be evaluated due to atrial fibrillation. Left ventricular diastolic function could not be evaluated. Right Ventricle: The right ventricular size is normal. No increase in right ventricular wall thickness. Right ventricular systolic function is normal. Left Atrium: Left atrial size was normal in size. Right Atrium: Right atrial size was normal in size. Pericardium: There is no evidence of pericardial effusion. Mitral Valve: The mitral valve is normal in structure. There is moderate calcification of the mitral valve leaflet(s). No evidence of mitral valve regurgitation. No evidence of mitral valve stenosis. Tricuspid Valve: The tricuspid valve is normal in structure. Tricuspid valve regurgitation is trivial. No evidence of tricuspid  stenosis. Aortic Valve: The aortic valve is tricuspid. There is severe calcifcation of the aortic valve. Aortic valve regurgitation is not visualized. Aortic valve sclerosis/calcification is present, without any evidence of aortic stenosis. Pulmonic Valve:  The pulmonic valve was normal in structure. Pulmonic valve regurgitation is not visualized. No evidence of pulmonic stenosis. Aorta: Aortic dilatation noted. There is mild dilatation of the ascending aorta, measuring 42 mm. Venous: The inferior vena cava is normal in size with greater than 50% respiratory variability, suggesting right atrial pressure of 3 mmHg. IAS/Shunts: No atrial level shunt detected by color flow Doppler. Agitated saline contrast was given intravenously to evaluate for intracardiac shunting. Agitated saline contrast bubble study was negative, with no evidence of any interatrial shunt.  LEFT VENTRICLE PLAX 2D LVIDd:         4.20 cm     Diastology LVIDs:         2.60 cm     LV e' medial:    6.53 cm/s LV PW:         0.90 cm     LV E/e' medial:  15.5 LV IVS:        1.20 cm     LV e' lateral:   8.38 cm/s LVOT diam:     2.30 cm     LV E/e' lateral: 12.1 LV SV:         66 LV SV Index:   35 LVOT Area:     4.15 cm  LV Volumes (MOD) LV vol d, MOD A2C: 82.7 ml LV vol d, MOD A4C: 77.8 ml LV vol s, MOD A2C: 25.0 ml LV vol s, MOD A4C: 19.7 ml LV SV MOD A2C:     57.7 ml LV SV MOD A4C:     77.8 ml LV SV MOD BP:      60.0 ml RIGHT VENTRICLE RV Basal diam:  3.30 cm RV S prime:     9.57 cm/s TAPSE (M-mode): 1.2 cm LEFT ATRIUM             Index        RIGHT ATRIUM           Index LA diam:        3.60 cm 1.90 cm/m   RA Area:     16.10 cm LA Vol (A2C):   84.9 ml 44.86 ml/m  RA Volume:   42.70 ml  22.56 ml/m LA Vol (A4C):   58.7 ml 31.02 ml/m LA Biplane Vol: 73.0 ml 38.57 ml/m  AORTIC VALVE             PULMONIC VALVE LVOT Vmax:   80.90 cm/s  PV Vmax:       0.79 m/s LVOT Vmean:  51.900 cm/s PV Peak grad:  2.5 mmHg LVOT VTI:    0.158 m  AORTA Ao Root diam: 3.60  cm Ao Asc diam:  4.20 cm MITRAL VALVE MV Area (PHT): 3.89 cm     SHUNTS MV Decel Time: 195 msec     Systemic VTI:  0.16 m MV E velocity: 101.00 cm/s  Systemic Diam: 2.30 cm MV A velocity: 32.30 cm/s MV E/A ratio:  3.13 Toribio Fuel MD Electronically signed by Toribio Fuel MD Signature Date/Time: 10/24/2023/2:25:42 PM    Final    EEG adult Result Date: 10/24/2023 Shelton Arlin KIDD, MD     10/24/2023 10:25 AM Patient Name: Zadrian Mccauley MRN: [id number] Epilepsy Attending: Arlin KIDD Shelton Referring Physician/Provider: Georgina Basket, MD Date: 10/24/2023 Duration: 22.21 mins Patient history: 68yo M with SDH. EEG to evaluate for seizure Level of alertness: Awake, asleep AEDs during EEG study: None Technical aspects: This EEG study was done with scalp electrodes positioned according to the  10-20 International system of electrode placement. Electrical activity was reviewed with band pass filter of 1-70Hz , sensitivity of 7 uV/mm, display speed of 75mm/sec with a 60Hz  notched filter applied as appropriate. EEG data were recorded continuously and digitally stored.  Video monitoring was available and reviewed as appropriate. Description: The posterior dominant rhythm consists of 8 Hz activity of moderate voltage (25-35 uV) seen predominantly in posterior head regions, symmetric and reactive to eye opening and eye closing. Sleep was characterized by vertex waves, sleep spindles (12 to 14 Hz), maximal frontocentral region. There is continuous generalized 3 to 6 Hz theta-delta slowing. Hyperventilation and photic stimulation were not performed.   ABNORMALITY - Continuous slow, generalized IMPRESSION: This study is suggestive of generalized cerebral dysfunction (encephalopathy). No seizures or epileptiform discharges were seen throughout the recording. Priyanka O Yadav   CT Head Wo Contrast Result Date: 10/23/2023 EXAM: CT HEAD WITHOUT CONTRAST 10/23/2023 04:11:01 PM TECHNIQUE: CT of the head was performed  without the administration of intravenous contrast. Automated exposure control, iterative reconstruction, and/or weight based adjustment of the mA/kV was utilized to reduce the radiation dose to as low as reasonably achievable. COMPARISON: 10/23/2023 CLINICAL HISTORY: Polytrauma, blunt; follow-up subdural hematoma. FINDINGS: BRAIN AND VENTRICLES: Stable 4 mm left frontal convexity subdural hematoma. No mass effect or midline shift is present. Periventricular and subcortical white matter hypoattenuation is stable, mildly advanced for age. Atherosclerotic calcifications in cavernous internal carotid arteries. No acute hemorrhage. No evidence of acute infarct. No hydrocephalus. ORBITS: Bilateral lens replacement noted. No acute abnormality. SINUSES: No acute abnormality. SOFT TISSUES AND SKULL: No acute soft tissue abnormality. No skull fracture. IMPRESSION: 1. Stable 4 mm left frontal convexity subdural hematoma without mass effect or midline shift. 2. No acute intracranial abnormality. Electronically signed by: Lonni Necessary MD 10/23/2023 04:25 PM EDT RP Workstation: [id number]   DG Chest Portable 1 View Result Date: 10/23/2023 EXAM: 1 VIEW XRAY OF THE CHEST 10/23/2023 10:01:00 AM COMPARISON: Chest radiograph dated 09/12/2023. CLINICAL HISTORY: Pt had fall last night around 2100. Was found on the floor this morning. FINDINGS: LUNGS AND PLEURA: Redemonstrated partially loculated small right pleural effusion and small left pleural effusion. No appreciable acute airspace consolidation. No pneumothorax. HEART AND MEDIASTINUM: Stable cardiomediastinal silhouette. Status post CABG in median sternotomy. Aortic atherosclerosis. BONES AND SOFT TISSUES: No acute osseous abnormality identified. IMPRESSION: 1. Partially loculated small right pleural effusion and small left pleural effusion, not significantly changed compared to the prior exam. No appreciable acute abnormality. Electronically signed by: Harrietta Sherry  MD 10/23/2023 10:32 AM EDT RP Workstation: [id number]   DG Pelvis Portable Result Date: 10/23/2023 EXAM: 1 or 2 VIEW(S) XRAY OF THE PELVIS 10/23/2023 10:01:00 AM COMPARISON: 05/16/2020 CLINICAL HISTORY: Patient had fall last night around 2100. Was found on the floor this morning. FINDINGS: BONES AND JOINTS: Diffuse osseous demineralization. No acute fracture. No joint dislocation. Femoral heads are seated within the acetabula. Mild osteoarthritis of the bilateral hips. SOFT TISSUES: Aortoiliac atherosclerotic calcification. IMPRESSION: 1. No acute osseous abnormality. Electronically signed by: Shahmeer Sherry MD 10/23/2023 10:26 AM EDT RP Workstation: [id number]   CT Cervical Spine Wo Contrast Result Date: 10/23/2023 EXAM: CT CERVICAL SPINE WITHOUT CONTRAST 10/23/2023 09:48:42 AM TECHNIQUE: CT of the cervical spine was performed without the administration of intravenous contrast. Multiplanar reformatted images are provided for review. Automated exposure control, iterative reconstruction, and/or weight-based adjustment of the mA/kV was utilized to reduce the radiation dose to as low as reasonably achievable. COMPARISON: Cervical spine CT 05/16/2020. CLINICAL HISTORY: 61-Year-Old Male,  Polytrauma. FINDINGS: CERVICAL SPINE: BONES AND ALIGNMENT: Stable cervical lordosis.  No acute findings. DEGENERATIVE CHANGES: Chronic upper cervical facet arthropathy on the left and lower cervical disc and endplate degeneration at C5-C6 and C6-C7. SOFT TISSUES: No prevertebral soft tissue swelling. IMPRESSION: 1. No acute traumatic injury identified in the cervical spine. Electronically signed by: Helayne Hurst MD 10/23/2023 10:03 AM EDT RP Workstation: [id number]   CT Head Wo Contrast Result Date: 10/23/2023 EXAM: CT HEAD WITHOUT CONTRAST 10/23/2023 09:48:42 AM TECHNIQUE: CT of the head was performed without the administration of intravenous contrast. Automated exposure control, iterative reconstruction, and/or weight based  adjustment of the mA/kV was utilized to reduce the radiation dose to as low as reasonably achievable. COMPARISON: 09/12/2023 CLINICAL HISTORY: Polytrauma, blunt. Pt bib gcems from home, lives with son. Pt had fall last night around 2100. Was found on the floor this morning covered in feces and vomit. Alert to self only. Takes eliquis , no obvious head trauma or injuries. Abrasion and redness to right knee, redness to right chest FINDINGS: BRAIN AND VENTRICLES: . No evidence of acute infarct. No hydrocephalus. No mass effect or midline shift. Diffuse cerebral volume loss. Chronic ischemic white matter disease. Acute subdural hematoma along left frontal convexity up to 4 mm in maximum thickness. Calcified atherosclerotic plaque within cavernous/supraclinoid internal carotid arteries. ORBITS: Bilateral lens replacement. SINUSES: No acute abnormality. SOFT TISSUES AND SKULL: No acute soft tissue abnormality. No skull fracture. Traumatic Brain Injury Risk Stratification ----- Skull Fracture: No (Low - mBIG 1) Subdural Hematoma (SDH): up to 4 mm in thickness (mBIG 2). Subarachnoid Hemorrhage [facility]): No Epidural Hematoma (EDH): No (Low - mBIG 1) Cerebral contusion, intra-axial, intraparenchymal Hemorrhage (IPH): No Intraventricular Hemorrhage (IVH): No (Low - mBIG 1) Midline Shift > 1mm or Edema/effacement of sulci/vents: No (Low - mBIG 1) IMPRESSION: 1. Acute left frontal convexity subdural hematoma measuring up to 4 mm in thickness. No midline shift or significant intracranial mass effect. Electronically signed by: Helayne Hurst MD 10/23/2023 09:57 AM EDT RP Workstation: [id number]    Microbiology: Results for orders placed or performed during the hospital encounter of 10/23/23  Culture, blood (Routine X 2) w Reflex to ID Panel     Status: Abnormal   Collection Time: 10/23/23 11:20 PM   Specimen: BLOOD  Result Value Ref Range Status   Specimen Description BLOOD RIGHT ANTECUBITAL  Final   Special Requests   Final     BOTTLES DRAWN AEROBIC AND ANAEROBIC Blood Culture results may not be optimal due to an inadequate volume of blood received in culture bottles   Culture  Setup Time   Final    GRAM POSITIVE COCCI AEROBIC BOTTLE ONLY CRITICAL RESULT CALLED TO, READ BACK BY AND VERIFIED WITH: PHARMD LYDIA CHEN [id number] AT 0837 BY EC    Culture (A)  Final    STAPHYLOCOCCUS AURICULARIS THE SIGNIFICANCE OF ISOLATING THIS ORGANISM FROM A SINGLE SET OF BLOOD CULTURES WHEN MULTIPLE SETS ARE DRAWN IS UNCERTAIN. PLEASE NOTIFY THE MICROBIOLOGY DEPARTMENT WITHIN ONE WEEK IF SPECIATION AND SENSITIVITIES ARE REQUIRED. Performed at [facility], [address], [place], [place] [address]    Report Status 10/28/2023 FINAL  Final  Blood Culture ID Panel (Reflexed)     Status: Abnormal   Collection Time: 10/23/23 11:20 PM  Result Value Ref Range Status   Enterococcus faecalis NOT DETECTED NOT DETECTED Final   Enterococcus Faecium NOT DETECTED NOT DETECTED Final   Listeria monocytogenes NOT DETECTED NOT DETECTED Final   Staphylococcus species DETECTED (  A) NOT DETECTED Final    Comment: CRITICAL RESULT CALLED TO, READ BACK BY AND VERIFIED WITH: PHARMD LYDIA CHEN [id number] AT 0837 BY EC    Staphylococcus aureus (BCID) NOT DETECTED NOT DETECTED Final   Staphylococcus epidermidis NOT DETECTED NOT DETECTED Final   Staphylococcus lugdunensis NOT DETECTED NOT DETECTED Final   Streptococcus species NOT DETECTED NOT DETECTED Final   Streptococcus agalactiae NOT DETECTED NOT DETECTED Final   Streptococcus pneumoniae NOT DETECTED NOT DETECTED Final   Streptococcus pyogenes NOT DETECTED NOT DETECTED Final   A.calcoaceticus-baumannii NOT DETECTED NOT DETECTED Final   Bacteroides fragilis NOT DETECTED NOT DETECTED Final   Enterobacterales NOT DETECTED NOT DETECTED Final   Enterobacter cloacae complex NOT DETECTED NOT DETECTED Final   Escherichia coli NOT DETECTED NOT DETECTED Final   Klebsiella aerogenes NOT DETECTED NOT  DETECTED Final   Klebsiella oxytoca NOT DETECTED NOT DETECTED Final   Klebsiella pneumoniae NOT DETECTED NOT DETECTED Final   Proteus species NOT DETECTED NOT DETECTED Final   Salmonella species NOT DETECTED NOT DETECTED Final   Serratia marcescens NOT DETECTED NOT DETECTED Final   Haemophilus influenzae NOT DETECTED NOT DETECTED Final   Neisseria meningitidis NOT DETECTED NOT DETECTED Final   Pseudomonas aeruginosa NOT DETECTED NOT DETECTED Final   Stenotrophomonas maltophilia NOT DETECTED NOT DETECTED Final   Candida albicans NOT DETECTED NOT DETECTED Final   Candida auris NOT DETECTED NOT DETECTED Final   Candida glabrata NOT DETECTED NOT DETECTED Final   Candida krusei NOT DETECTED NOT DETECTED Final   Candida parapsilosis NOT DETECTED NOT DETECTED Final   Candida tropicalis NOT DETECTED NOT DETECTED Final   Cryptococcus neoformans/gattii NOT DETECTED NOT DETECTED Final    Comment: Performed at [facility], [address], [place], [place] [address]  Culture, blood (Routine X 2) w Reflex to ID Panel     Status: None   Collection Time: 10/23/23 11:44 PM   Specimen: BLOOD RIGHT ARM  Result Value Ref Range Status   Specimen Description BLOOD RIGHT ARM  Final   Special Requests   Final    BOTTLES DRAWN AEROBIC AND ANAEROBIC Blood Culture adequate volume   Culture   Final    NO GROWTH 5 DAYS Performed at [facility], [address] [place], [place] [address]    Report Status 10/29/2023 FINAL  Final    Labs: CBC: Recent Labs  Lab 11/02/23 0922 11/03/23 0934  WBC 5.4 6.4  HGB 7.9* 8.3*  HCT 25.8* 26.6*  MCV 88.1 88.1  PLT 198 220   Basic Metabolic Panel: Recent Labs  Lab 11/02/23 0922 11/03/23 0934  NA 135 136  K 4.8 4.5  CL 103 103  CO2 23 27  GLUCOSE 115* 92  BUN 40* 33*  CREATININE 1.09 0.84  CALCIUM  7.9* 8.0*   Liver Function Tests: Recent Labs  Lab 11/02/23 0922  AST 45*  ALT 36  ALKPHOS 157*  BILITOT 0.3  PROT 5.7*  ALBUMIN 1.9*    CBG: Recent Labs  Lab 11/05/23 1145 11/05/23 1630 11/05/23 2125 11/06/23 0749 11/06/23 1159  GLUCAP 175* 181* 145* 139* 137*    Discharge time spent: approximately 45 minutes spent on discharge counseling, evaluation of patient on day of discharge, and coordination of discharge planning with nursing, social work, pharmacy and case management  Signed: Lonni SHAUNNA Dalton, MD [facility] 11/06/2023

## 2023-11-07 ENCOUNTER — Ambulatory Visit: Attending: Cardiovascular Disease | Admitting: Cardiovascular Disease

## 2023-11-07 ENCOUNTER — Telehealth: Payer: Self-pay

## 2023-11-07 DIAGNOSIS — I480 Paroxysmal atrial fibrillation: Secondary | ICD-10-CM

## 2023-11-07 DIAGNOSIS — I739 Peripheral vascular disease, unspecified: Secondary | ICD-10-CM

## 2023-11-07 DIAGNOSIS — E782 Mixed hyperlipidemia: Secondary | ICD-10-CM

## 2023-11-07 DIAGNOSIS — I10 Essential (primary) hypertension: Secondary | ICD-10-CM

## 2023-11-07 DIAGNOSIS — I2510 Atherosclerotic heart disease of native coronary artery without angina pectoris: Secondary | ICD-10-CM

## 2023-11-07 DIAGNOSIS — I5032 Chronic diastolic (congestive) heart failure: Secondary | ICD-10-CM

## 2023-11-07 NOTE — Telephone Encounter (Signed)
 Copied from CRM #[id number]. Topic: Clinical - Medication Question >> Nov 07, 2023  2:32 PM Winona SAUNDERS wrote: Nathanel hollard calling from [facility] calling to request 6 months of records on a few medications. Call back is [phone] Fax number  [phone] QUEtiapine  (SEROQUEL ) 25 MG tablet PARoxetine  (PAXIL ) 20 MG tablet mirtazapine  (REMERON ) 15 MG tablet  Called [facility] and gave them our [employer] Records number [phone].  Tawni DEL., CMA

## 2023-11-08 NOTE — Telephone Encounter (Signed)
 Sorry to hear this. Let's keep him on the wait list and we can chart check in another 4-6 weeks and see how he is doing. Not sure if / when he will be able to do an exam. Thanks

## 2023-11-08 NOTE — Telephone Encounter (Signed)
 [name] [name] see note below and please add him to the the wait list for Dr. Leigh.

## 2023-11-12 ENCOUNTER — Other Ambulatory Visit: Payer: Self-pay

## 2023-11-12 ENCOUNTER — Inpatient Hospital Stay (HOSPITAL_COMMUNITY)
Admission: EM | Admit: 2023-11-12 | Discharge: 2023-12-24 | DRG: 698 | Disposition: A | Discharge status: Skilled Nursing Facility | Source: Skilled Nursing Facility | Attending: Internal Medicine | Admitting: Internal Medicine

## 2023-11-12 ENCOUNTER — Encounter (HOSPITAL_COMMUNITY): Payer: Self-pay | Admitting: Emergency Medicine

## 2023-11-12 DIAGNOSIS — L409 Psoriasis, unspecified: Secondary | ICD-10-CM | POA: Diagnosis present

## 2023-11-12 DIAGNOSIS — R319 Hematuria, unspecified: Secondary | ICD-10-CM | POA: Diagnosis present

## 2023-11-12 DIAGNOSIS — Z9181 History of falling: Secondary | ICD-10-CM

## 2023-11-12 DIAGNOSIS — N3001 Acute cystitis with hematuria: Secondary | ICD-10-CM | POA: Diagnosis present

## 2023-11-12 DIAGNOSIS — I480 Paroxysmal atrial fibrillation: Secondary | ICD-10-CM | POA: Diagnosis present

## 2023-11-12 DIAGNOSIS — F17290 Nicotine dependence, other tobacco product, uncomplicated: Secondary | ICD-10-CM | POA: Diagnosis present

## 2023-11-12 DIAGNOSIS — Y846 Urinary catheterization as the cause of abnormal reaction of the patient, or of later complication, without mention of misadventure at the time of the procedure: Secondary | ICD-10-CM | POA: Diagnosis present

## 2023-11-12 DIAGNOSIS — Z515 Encounter for palliative care: Secondary | ICD-10-CM

## 2023-11-12 DIAGNOSIS — J948 Other specified pleural conditions: Secondary | ICD-10-CM | POA: Diagnosis present

## 2023-11-12 DIAGNOSIS — Z807 Family history of other malignant neoplasms of lymphoid, hematopoietic and related tissues: Secondary | ICD-10-CM

## 2023-11-12 DIAGNOSIS — R54 Age-related physical debility: Secondary | ICD-10-CM | POA: Diagnosis present

## 2023-11-12 DIAGNOSIS — R440 Auditory hallucinations: Secondary | ICD-10-CM | POA: Diagnosis not present

## 2023-11-12 DIAGNOSIS — L89153 Pressure ulcer of sacral region, stage 3: Secondary | ICD-10-CM | POA: Diagnosis present

## 2023-11-12 DIAGNOSIS — Z781 Physical restraint status: Secondary | ICD-10-CM

## 2023-11-12 DIAGNOSIS — T83518A Infection and inflammatory reaction due to other urinary catheter, initial encounter: Secondary | ICD-10-CM | POA: Diagnosis present

## 2023-11-12 DIAGNOSIS — D509 Iron deficiency anemia, unspecified: Secondary | ICD-10-CM | POA: Diagnosis present

## 2023-11-12 DIAGNOSIS — Z8673 Personal history of transient ischemic attack (TIA), and cerebral infarction without residual deficits: Secondary | ICD-10-CM

## 2023-11-12 DIAGNOSIS — R001 Bradycardia, unspecified: Secondary | ICD-10-CM | POA: Diagnosis present

## 2023-11-12 DIAGNOSIS — E43 Unspecified severe protein-calorie malnutrition: Secondary | ICD-10-CM | POA: Diagnosis present

## 2023-11-12 DIAGNOSIS — Z7401 Bed confinement status: Secondary | ICD-10-CM

## 2023-11-12 DIAGNOSIS — K2970 Gastritis, unspecified, without bleeding: Secondary | ICD-10-CM | POA: Diagnosis present

## 2023-11-12 DIAGNOSIS — E8809 Other disorders of plasma-protein metabolism, not elsewhere classified: Secondary | ICD-10-CM | POA: Diagnosis present

## 2023-11-12 DIAGNOSIS — E860 Dehydration: Secondary | ICD-10-CM | POA: Diagnosis present

## 2023-11-12 DIAGNOSIS — Z8679 Personal history of other diseases of the circulatory system: Secondary | ICD-10-CM

## 2023-11-12 DIAGNOSIS — N179 Acute kidney failure, unspecified: Secondary | ICD-10-CM | POA: Diagnosis present

## 2023-11-12 DIAGNOSIS — T83091A Other mechanical complication of indwelling urethral catheter, initial encounter: Principal | ICD-10-CM | POA: Diagnosis present

## 2023-11-12 DIAGNOSIS — E11319 Type 2 diabetes mellitus with unspecified diabetic retinopathy without macular edema: Secondary | ICD-10-CM | POA: Diagnosis present

## 2023-11-12 DIAGNOSIS — Z800 Family history of malignant neoplasm of digestive organs: Secondary | ICD-10-CM

## 2023-11-12 DIAGNOSIS — F329 Major depressive disorder, single episode, unspecified: Secondary | ICD-10-CM | POA: Diagnosis present

## 2023-11-12 DIAGNOSIS — Z960 Presence of urogenital implants: Secondary | ICD-10-CM | POA: Diagnosis present

## 2023-11-12 DIAGNOSIS — Z7984 Long term (current) use of oral hypoglycemic drugs: Secondary | ICD-10-CM

## 2023-11-12 DIAGNOSIS — Z91148 Patient's other noncompliance with medication regimen for other reason: Secondary | ICD-10-CM

## 2023-11-12 DIAGNOSIS — K529 Noninfective gastroenteritis and colitis, unspecified: Secondary | ICD-10-CM | POA: Diagnosis present

## 2023-11-12 DIAGNOSIS — D62 Acute posthemorrhagic anemia: Secondary | ICD-10-CM | POA: Diagnosis present

## 2023-11-12 DIAGNOSIS — Z860101 Personal history of adenomatous and serrated colon polyps: Secondary | ICD-10-CM

## 2023-11-12 DIAGNOSIS — R195 Other fecal abnormalities: Secondary | ICD-10-CM | POA: Diagnosis present

## 2023-11-12 DIAGNOSIS — F411 Generalized anxiety disorder: Secondary | ICD-10-CM | POA: Diagnosis present

## 2023-11-12 DIAGNOSIS — D638 Anemia in other chronic diseases classified elsewhere: Secondary | ICD-10-CM | POA: Diagnosis present

## 2023-11-12 DIAGNOSIS — J90 Pleural effusion, not elsewhere classified: Secondary | ICD-10-CM

## 2023-11-12 DIAGNOSIS — J869 Pyothorax without fistula: Secondary | ICD-10-CM | POA: Diagnosis present

## 2023-11-12 DIAGNOSIS — R310 Gross hematuria: Principal | ICD-10-CM

## 2023-11-12 DIAGNOSIS — K2980 Duodenitis without bleeding: Secondary | ICD-10-CM | POA: Diagnosis present

## 2023-11-12 DIAGNOSIS — G4700 Insomnia, unspecified: Secondary | ICD-10-CM | POA: Diagnosis present

## 2023-11-12 DIAGNOSIS — K269 Duodenal ulcer, unspecified as acute or chronic, without hemorrhage or perforation: Secondary | ICD-10-CM | POA: Diagnosis not present

## 2023-11-12 DIAGNOSIS — R262 Difficulty in walking, not elsewhere classified: Secondary | ICD-10-CM | POA: Diagnosis present

## 2023-11-12 DIAGNOSIS — N486 Induration penis plastica: Secondary | ICD-10-CM | POA: Diagnosis present

## 2023-11-12 DIAGNOSIS — Z951 Presence of aortocoronary bypass graft: Secondary | ICD-10-CM

## 2023-11-12 DIAGNOSIS — J918 Pleural effusion in other conditions classified elsewhere: Secondary | ICD-10-CM | POA: Diagnosis present

## 2023-11-12 DIAGNOSIS — E785 Hyperlipidemia, unspecified: Secondary | ICD-10-CM | POA: Diagnosis present

## 2023-11-12 DIAGNOSIS — K648 Other hemorrhoids: Secondary | ICD-10-CM | POA: Diagnosis present

## 2023-11-12 DIAGNOSIS — I700 Atherosclerosis of aorta: Secondary | ICD-10-CM | POA: Diagnosis present

## 2023-11-12 DIAGNOSIS — Z79899 Other long term (current) drug therapy: Secondary | ICD-10-CM

## 2023-11-12 DIAGNOSIS — T45515A Adverse effect of anticoagulants, initial encounter: Secondary | ICD-10-CM | POA: Diagnosis present

## 2023-11-12 DIAGNOSIS — I110 Hypertensive heart disease with heart failure: Secondary | ICD-10-CM | POA: Diagnosis present

## 2023-11-12 DIAGNOSIS — I482 Chronic atrial fibrillation, unspecified: Secondary | ICD-10-CM | POA: Diagnosis present

## 2023-11-12 DIAGNOSIS — R159 Full incontinence of feces: Secondary | ICD-10-CM | POA: Diagnosis present

## 2023-11-12 DIAGNOSIS — I493 Ventricular premature depolarization: Secondary | ICD-10-CM | POA: Diagnosis present

## 2023-11-12 DIAGNOSIS — I5032 Chronic diastolic (congestive) heart failure: Secondary | ICD-10-CM | POA: Diagnosis present

## 2023-11-12 DIAGNOSIS — E1151 Type 2 diabetes mellitus with diabetic peripheral angiopathy without gangrene: Secondary | ICD-10-CM | POA: Diagnosis present

## 2023-11-12 DIAGNOSIS — N529 Male erectile dysfunction, unspecified: Secondary | ICD-10-CM | POA: Diagnosis present

## 2023-11-12 DIAGNOSIS — L97522 Non-pressure chronic ulcer of other part of left foot with fat layer exposed: Secondary | ICD-10-CM | POA: Diagnosis present

## 2023-11-12 DIAGNOSIS — R441 Visual hallucinations: Secondary | ICD-10-CM | POA: Diagnosis not present

## 2023-11-12 DIAGNOSIS — R630 Anorexia: Secondary | ICD-10-CM | POA: Diagnosis not present

## 2023-11-12 DIAGNOSIS — B957 Other staphylococcus as the cause of diseases classified elsewhere: Secondary | ICD-10-CM | POA: Diagnosis present

## 2023-11-12 DIAGNOSIS — R451 Restlessness and agitation: Secondary | ICD-10-CM | POA: Diagnosis not present

## 2023-11-12 DIAGNOSIS — N400 Enlarged prostate without lower urinary tract symptoms: Secondary | ICD-10-CM | POA: Diagnosis present

## 2023-11-12 DIAGNOSIS — D6832 Hemorrhagic disorder due to extrinsic circulating anticoagulants: Secondary | ICD-10-CM | POA: Diagnosis present

## 2023-11-12 DIAGNOSIS — E11621 Type 2 diabetes mellitus with foot ulcer: Secondary | ICD-10-CM | POA: Diagnosis present

## 2023-11-12 DIAGNOSIS — Z7982 Long term (current) use of aspirin: Secondary | ICD-10-CM

## 2023-11-12 DIAGNOSIS — E1165 Type 2 diabetes mellitus with hyperglycemia: Secondary | ICD-10-CM | POA: Diagnosis present

## 2023-11-12 DIAGNOSIS — Z7901 Long term (current) use of anticoagulants: Secondary | ICD-10-CM

## 2023-11-12 DIAGNOSIS — Z681 Body mass index (BMI) 19 or less, adult: Secondary | ICD-10-CM

## 2023-11-12 DIAGNOSIS — I2510 Atherosclerotic heart disease of native coronary artery without angina pectoris: Secondary | ICD-10-CM | POA: Diagnosis present

## 2023-11-12 DIAGNOSIS — Z5309 Procedure and treatment not carried out because of other contraindication: Secondary | ICD-10-CM | POA: Diagnosis not present

## 2023-11-12 LAB — CBC WITH DIFFERENTIAL/PLATELET
Abs Immature Granulocytes: 0.02 K/uL (ref 0.00–0.07)
Basophils Absolute: 0 K/uL (ref 0.0–0.1)
Basophils Relative: 1 %
Eosinophils Absolute: 0.2 K/uL (ref 0.0–0.5)
Eosinophils Relative: 3 %
HCT: 24.8 % — ABNORMAL LOW (ref 39.0–52.0)
Hemoglobin: 7.5 g/dL — ABNORMAL LOW (ref 13.0–17.0)
Immature Granulocytes: 0 %
Lymphocytes Relative: 10 %
Lymphs Abs: 0.6 K/uL — ABNORMAL LOW (ref 0.7–4.0)
MCH: 27 pg (ref 26.0–34.0)
MCHC: 30.2 g/dL (ref 30.0–36.0)
MCV: 89.2 fL (ref 80.0–100.0)
Monocytes Absolute: 0.7 K/uL (ref 0.1–1.0)
Monocytes Relative: 10 %
Neutro Abs: 4.9 K/uL (ref 1.7–7.7)
Neutrophils Relative %: 76 %
Platelets: 244 K/uL (ref 150–400)
RBC: 2.78 MIL/uL — ABNORMAL LOW (ref 4.22–5.81)
RDW: 16.9 % — ABNORMAL HIGH (ref 11.5–15.5)
WBC: 6.4 K/uL (ref 4.0–10.5)
nRBC: 0 % (ref 0.0–0.2)

## 2023-11-12 LAB — COMPREHENSIVE METABOLIC PANEL WITH GFR
ALT: 32 U/L (ref 0–44)
AST: 27 U/L (ref 15–41)
Albumin: 2.2 g/dL — ABNORMAL LOW (ref 3.5–5.0)
Alkaline Phosphatase: 177 U/L — ABNORMAL HIGH (ref 38–126)
Anion gap: 9 (ref 5–15)
BUN: 94 mg/dL — ABNORMAL HIGH (ref 8–23)
CO2: 27 mmol/L (ref 22–32)
Calcium: 8.4 mg/dL — ABNORMAL LOW (ref 8.9–10.3)
Chloride: 100 mmol/L (ref 98–111)
Creatinine, Ser: 1.48 mg/dL — ABNORMAL HIGH (ref 0.61–1.24)
GFR, Estimated: 51 mL/min — ABNORMAL LOW (ref >60)
Glucose, Bld: 235 mg/dL — ABNORMAL HIGH (ref 70–99)
Potassium: 4.9 mmol/L (ref 3.5–5.1)
Sodium: 136 mmol/L (ref 135–145)
Total Bilirubin: <0.2 mg/dL (ref 0.0–1.2)
Total Protein: 6.7 g/dL (ref 6.5–8.1)

## 2023-11-12 LAB — I-STAT CHEM 8, ED
BUN: 93 mg/dL — ABNORMAL HIGH (ref 8–23)
Calcium, Ion: 1.12 mmol/L — ABNORMAL LOW (ref 1.15–1.40)
Chloride: 100 mmol/L (ref 98–111)
Creatinine, Ser: 1.5 mg/dL — ABNORMAL HIGH (ref 0.61–1.24)
Glucose, Bld: 224 mg/dL — ABNORMAL HIGH (ref 70–99)
HCT: 23 % — ABNORMAL LOW (ref 39.0–52.0)
Hemoglobin: 7.8 g/dL — ABNORMAL LOW (ref 13.0–17.0)
Potassium: 5 mmol/L (ref 3.5–5.1)
Sodium: 137 mmol/L (ref 135–145)
TCO2: 27 mmol/L (ref 22–32)

## 2023-11-12 LAB — PROTIME-INR
INR: 1.6 — ABNORMAL HIGH (ref 0.8–1.2)
Prothrombin Time: 20 s — ABNORMAL HIGH (ref 11.4–15.2)

## 2023-11-12 NOTE — ED Provider Notes (Incomplete)
 [place] EMERGENCY DEPARTMENT AT [facility] Provider Note   CSN: [id number] Arrival date & time: 11/12/23  2150     Patient presents with: Abnormal Lab   [name] [name] is a 68 y.o. male.  {Add pertinent medical, surgical, social history, OB history to HPI:32947} 68 yo male presents via EMS from SNF with concern for elevated BUN. Patient on Eliquis , recently dc from hospital where he was admitted for subdural bleed. Eliquis  held on admission and resumed prior to DC. Patient with visual impairment, unknown changes in stools. Noted to have blood in foley bag.        Prior to Admission medications   Medication Sig Start Date End Date Taking? Authorizing Provider  acetaminophen  (TYLENOL ) 500 MG tablet Take 500 mg by mouth as needed for mild pain (pain score 1-3) or moderate pain (pain score 4-6).    [provider]  amLODipine  (NORVASC ) 10 MG tablet Take 1 tablet (10 mg total) by mouth daily. 11/07/23   Danford, Lonni SQUIBB, MD  atorvastatin  (LIPITOR) 40 MG tablet Take 1 tablet (40 mg total) by mouth daily. 08/30/23   [name], Mardy HERO, PA-C  Cholecalciferol  (VITAMIN D3) 50 MCG (2000 UT) capsule Take 4,000 Units by mouth daily.    [provider]  cyanocobalamin  1000 MCG tablet Take 1 tablet (1,000 mcg total) by mouth daily. 11/07/23   Danford, Lonni SQUIBB, MD  ELIQUIS  5 MG TABS tablet TAKE 1 TABLET BY MOUTH TWICE A DAY 10/14/23   Krasowski, Robert J, MD  feeding supplement (ENSURE PLUS HIGH PROTEIN) LIQD Take 237 mLs by mouth 2 (two) times daily between meals. 11/06/23   Danford, Lonni SQUIBB, MD  glipiZIDE  (GLUCOTROL ) 5 MG tablet Take 1 tablet (5 mg total) by mouth 2 (two) times daily before a meal. Patient taking differently: Take 2.5-5 mg by mouth 2 (two) times daily before a meal. Take 5mg  (1 tablet) by mouth every morning and 2.5mg  (1/2 tablet) every night. 08/22/23   Shamleffer, Ibtehal Jaralla, MD  loratadine  (CLARITIN ) 10 MG tablet Take 10 mg  by mouth daily as needed for allergies.    [provider]  losartan  (COZAAR ) 100 MG tablet Take 1 tablet (100 mg total) by mouth daily. 11/07/23   Danford, Lonni SQUIBB, MD  mirtazapine  (REMERON ) 15 MG tablet TAKE 1 TABLET BY MOUTH EVERYDAY AT BEDTIME 04/23/23   [name], [name], PA-C  Multiple Vitamin (MULTIVITAMIN WITH MINERALS) TABS tablet Take 1 tablet by mouth daily. 11/07/23   Danford, Lonni SQUIBB, MD  nutrition supplement, JUVEN, (JUVEN) PACK Take 1 packet by mouth 2 (two) times daily between meals. 11/06/23   Danford, Lonni SQUIBB, MD  PARoxetine  (PAXIL ) 20 MG tablet Take 20 mg by mouth daily.    [provider]  QUEtiapine  (SEROQUEL ) 25 MG tablet Take 25 mg by mouth at bedtime. 09/16/23   [provider]  sitaGLIPtin  (JANUVIA ) 100 MG tablet Take 100 mg by mouth daily.    [provider]    Allergies: Other    Review of Systems Negative except as per HPI Updated Vital Signs BP (!) 118/50   Pulse 63   Temp 98.3 F (36.8 C) (Oral)   Resp 20   Ht 6' 3 (1.905 m)   Wt 68 kg   SpO2 93%   BMI 18.75 kg/m   Physical Exam Vitals and nursing note reviewed.  Constitutional:      General: He is not in acute distress.    Appearance: He is well-developed.  He is not diaphoretic.  HENT:     Head: Normocephalic and atraumatic.     Mouth/Throat:     Mouth: Mucous membranes are dry.  Cardiovascular:     Rate and Rhythm: Regular rhythm. Bradycardia present.     Heart sounds: Normal heart sounds.  Pulmonary:     Effort: Pulmonary effort is normal.     Breath sounds: Normal breath sounds.  Abdominal:     Palpations: Abdomen is soft.     Tenderness: There is no abdominal tenderness.  Musculoskeletal:     Right lower leg: No edema.     Left lower leg: No edema.  Skin:    General: Skin is warm and dry.     Coloration: Skin is pale.  Neurological:     Mental Status: He is alert and oriented to person, place, and time.  Psychiatric:         Behavior: Behavior normal.     (all labs ordered are listed, but only abnormal results are displayed) Labs Reviewed  CBC WITH DIFFERENTIAL/PLATELET - Abnormal; Notable for the following components:      Result Value   RBC 2.78 (*)    Hemoglobin 7.5 (*)    HCT 24.8 (*)    RDW 16.9 (*)    Lymphs Abs 0.6 (*)    All other components within normal limits  PROTIME-INR - Abnormal; Notable for the following components:   Prothrombin Time 20.0 (*)    INR 1.6 (*)    All other components within normal limits  COMPREHENSIVE METABOLIC PANEL WITH GFR - Abnormal; Notable for the following components:   Glucose, Bld 235 (*)    BUN 94 (*)    Creatinine, Ser 1.48 (*)    Calcium  8.4 (*)    Albumin 2.2 (*)    Alkaline Phosphatase 177 (*)    GFR, Estimated 51 (*)    All other components within normal limits  I-STAT CHEM 8, ED - Abnormal; Notable for the following components:   BUN 93 (*)    Creatinine, Ser 1.50 (*)    Glucose, Bld 224 (*)    Calcium , Ion 1.12 (*)    Hemoglobin 7.8 (*)    HCT 23.0 (*)    All other components within normal limits  POC OCCULT BLOOD, ED  TYPE AND SCREEN    EKG: EKG Interpretation Date/Time:  Tuesday November 12 2023 22:01:37 EST Ventricular Rate:  51 PR Interval:  152 QRS Duration:  128 QT Interval:  479 QTC Calculation: 437 R Axis:   78  Text Interpretation: Sinus rhythm Ventricular premature complex Nonspecific intraventricular conduction delay Poor data quality in current ECG precludes serial comparison Confirmed by Haze Lonni PARAS [phone]) on 11/12/2023 11:16:03 PM  Radiology: No results found.  {Document cardiac monitor, telemetry assessment procedure when appropriate:32947} Procedures   Medications Ordered in the ED - No data to display    {Click here for ABCD2, HEART and other calculators REFRESH Note before signing:1}                              Medical Decision Making Amount and/or Complexity of Data Reviewed Labs:  ordered.   This patient presents to the ED for concern of ***, this involves an extensive number of treatment options, and is a complaint that carries with it a high risk of complications and morbidity.  The differential diagnosis includes ***   Co morbidities / Chronic conditions that  complicate the patient evaluation  ***   Additional history obtained:  Additional history obtained from EMR External records from outside source obtained and reviewed including ***   Lab Tests:  I Ordered, and personally interpreted labs.  The pertinent results include:  ***   Imaging Studies ordered:  I ordered imaging studies including ***  I independently visualized and interpreted imaging which showed *** I agree with the radiologist interpretation   Cardiac Monitoring: / EKG:  The patient was maintained on a cardiac monitor.  I personally viewed and interpreted the cardiac monitored which showed an underlying rhythm of: ***   Problem List / ED Course / Critical interventions / Medication management  *** I ordered medication including ***   Reevaluation of the patient after these medicines showed that the patient *** I have reviewed the patients home medicines and have made adjustments as needed   Consultations Obtained:  I requested consultation with the ***,  and discussed lab and imaging findings as well as pertinent plan - they recommend: ***   Social Determinants of Health:  ***   Test / Admission - Considered:  ***   {Document critical care time when appropriate  Document review of labs and clinical decision tools ie CHADS2VASC2, etc  Document your independent review of radiology images and any outside records  Document your discussion with family members, caretakers and with consultants  Document social determinants of health affecting pt's care  Document your decision making why or why not admission, treatments were needed:32947:::1}   Final diagnoses:  None     ED Discharge Orders     None

## 2023-11-12 NOTE — ED Provider Notes (Signed)
  EMERGENCY DEPARTMENT AT [facility] Provider Note   CSN: [id number] Arrival date & time: 11/12/23  2150     Patient presents with: Abnormal Lab   [name] [name] is a 68 y.o. male.   69 yo male presents via EMS from SNF with concern for elevated BUN. Patient on Eliquis , recently dc from hospital where he was admitted for subdural bleed. Eliquis  held on admission and resumed prior to DC. Patient with visual impairment, unknown changes in stools. Noted to have blood in foley bag.        Prior to Admission medications   Medication Sig Start Date End Date Taking? Authorizing Provider  acetaminophen  (TYLENOL ) 500 MG tablet Take 500 mg by mouth every 6 (six) hours as needed for mild pain (pain score 1-3) or moderate pain (pain score 4-6).   Yes [provider]  Amino Acids-Protein Hydrolys (FEEDING SUPPLEMENT, PRO-STAT SUGAR FREE 64,) LIQD Take 30 mLs by mouth in the morning and at bedtime.   Yes [provider]  amLODipine  (NORVASC ) 10 MG tablet Take 1 tablet (10 mg total) by mouth daily. 11/07/23  Yes Danford, [name] SQUIBB, MD  atorvastatin  (LIPITOR) 40 MG tablet Take 1 tablet (40 mg total) by mouth daily. 08/30/23  Yes [name], [name] M, PA-C  Cholecalciferol  (VITAMIN D3) 50 MCG (2000 UT) capsule Take 4,000 Units by mouth daily.   Yes [provider]  cyanocobalamin  1000 MCG tablet Take 1 tablet (1,000 mcg total) by mouth daily. 11/07/23  Yes Danford, [name] SQUIBB, MD  docusate sodium (COLACE) 100 MG capsule Take 100 mg by mouth 2 (two) times daily.   Yes [provider]  ELIQUIS  5 MG TABS tablet TAKE 1 TABLET BY MOUTH TWICE A DAY 10/14/23  Yes Krasowski, [name] J, MD  ferrous sulfate 325 (65 FE) MG tablet Take 325 mg by mouth daily with breakfast.   Yes [provider]  glipiZIDE  (GLUCOTROL ) 5 MG tablet Take 1 tablet (5 mg total) by mouth 2 (two) times daily before a meal. 08/22/23  Yes Shamleffer, Donell Cardinal, MD   loratadine  (CLARITIN ) 10 MG tablet Take 10 mg by mouth daily as needed for allergies.   Yes [provider]  losartan  (COZAAR ) 100 MG tablet Take 1 tablet (100 mg total) by mouth daily. 11/07/23  Yes Danford, [name] SQUIBB, MD  mirtazapine  (REMERON ) 15 MG tablet TAKE 1 TABLET BY MOUTH EVERYDAY AT BEDTIME 04/23/23  Yes [name], [name] M, PA-C  Multiple Vitamin (MULTIVITAMIN WITH MINERALS) TABS tablet Take 1 tablet by mouth daily. 11/07/23  Yes Danford, [name] SQUIBB, MD  nutrition supplement, JUVEN, (JUVEN) PACK Take 1 packet by mouth 2 (two) times daily between meals. 11/06/23  Yes Danford, [name] SQUIBB, MD  PARoxetine  (PAXIL ) 20 MG tablet Take 20 mg by mouth daily.   Yes [provider]  QUEtiapine  (SEROQUEL ) 25 MG tablet Take 25 mg by mouth at bedtime. 09/16/23  Yes [provider]  sitaGLIPtin  (JANUVIA ) 100 MG tablet Take 100 mg by mouth daily.   Yes [provider]  zinc  oxide 20 % ointment Apply 1 Application topically in the morning, at noon, and at bedtime.   Yes [provider]  feeding supplement (ENSURE PLUS HIGH PROTEIN) LIQD Take 237 mLs by mouth 2 (two) times daily between meals. Patient not taking: Reported on 11/13/2023 11/06/23   [name] [name] SQUIBB, MD    Allergies: Other    Review of Systems Negative except as per HPI Updated Vital Signs BP ROLLEN)  117/53   Pulse (!) 50   Temp 98.2 F (36.8 C) (Oral)   Resp 18   Ht 6' 3 (1.905 [name])   Wt 68 kg   SpO2 98%   BMI 18.75 kg/[name]   Physical Exam Vitals and nursing note reviewed. Exam conducted with a chaperone present.  Constitutional:      General: He is not in acute distress.    Appearance: He is well-developed. He is not diaphoretic.  HENT:     Head: Normocephalic and atraumatic.     Mouth/Throat:     Mouth: Mucous membranes are dry.  Cardiovascular:     Rate and Rhythm: Regular rhythm. Bradycardia present.     Heart sounds: Normal heart sounds.  Pulmonary:     Effort:  Pulmonary effort is normal.     Breath sounds: Normal breath sounds.  Abdominal:     Palpations: Abdomen is soft.     Tenderness: There is no abdominal tenderness.  Genitourinary:    Comments: Foley in place, gross hematuria  Stool soft and brown, hemoccult positive  Musculoskeletal:     Right lower leg: No edema.     Left lower leg: No edema.  Skin:    General: Skin is warm and dry.     Coloration: Skin is pale.     Comments: Known sacral decubitus ulcer   Neurological:     Mental Status: He is alert and oriented to person, place, and time.  Psychiatric:        Behavior: Behavior normal.     (all labs ordered are listed, but only abnormal results are displayed) Labs Reviewed  CBC WITH DIFFERENTIAL/PLATELET - Abnormal; Notable for the following components:      Result Value   RBC 2.78 (*)    Hemoglobin 7.5 (*)    HCT 24.8 (*)    RDW 16.9 (*)    Lymphs Abs 0.6 (*)    All other components within normal limits  PROTIME-INR - Abnormal; Notable for the following components:   Prothrombin Time 20.0 (*)    INR 1.6 (*)    All other components within normal limits  COMPREHENSIVE METABOLIC PANEL WITH GFR - Abnormal; Notable for the following components:   Glucose, Bld 235 (*)    BUN 94 (*)    Creatinine, Ser 1.48 (*)    Calcium  8.4 (*)    Albumin 2.2 (*)    Alkaline Phosphatase 177 (*)    GFR, Estimated 51 (*)    All other components within normal limits  URINALYSIS, ROUTINE W REFLEX MICROSCOPIC - Abnormal; Notable for the following components:   Color, Urine RED (*)    APPearance TURBID (*)    Glucose, UA 250 (*)    Hgb urine dipstick LARGE (*)    Bilirubin Urine MODERATE (*)    Ketones, ur 15 (*)    Protein, ur >300 (*)    Nitrite POSITIVE (*)    Leukocytes,Ua LARGE (*)    All other components within normal limits  URINALYSIS, MICROSCOPIC (REFLEX) - Abnormal; Notable for the following components:   Bacteria, UA RARE (*)    All other components within normal limits   I-STAT CHEM 8, ED - Abnormal; Notable for the following components:   BUN 93 (*)    Creatinine, Ser 1.50 (*)    Glucose, Bld 224 (*)    Calcium , Ion 1.12 (*)    Hemoglobin 7.8 (*)    HCT 23.0 (*)    All other components within  normal limits  POC OCCULT BLOOD, ED - Abnormal; Notable for the following components:   Fecal Occult Bld POSITIVE (*)    All other components within normal limits  URINE CULTURE  TYPE AND SCREEN    EKG: EKG Interpretation Date/Time:  Tuesday November 12 2023 22:01:37 EST Ventricular Rate:  51 PR Interval:  152 QRS Duration:  128 QT Interval:  479 QTC Calculation: 437 R Axis:   78  Text Interpretation: Sinus rhythm Ventricular premature complex Nonspecific intraventricular conduction delay Poor data quality in current ECG precludes serial comparison Confirmed by [name] [name] [phone]) on 11/12/2023 11:16:03 PM  Radiology: No results found.   Procedures   Medications Ordered in the ED  cefTRIAXone  (ROCEPHIN ) 1 g in sodium chloride  0.9 % 100 mL IVPB (1 g Intravenous New Bag/Given 11/13/23 0217)  sodium chloride  0.9 % bolus 500 mL (0 mLs Intravenous Stopped 11/13/23 0220)                                    Medical Decision Making Amount and/or Complexity of Data Reviewed Labs: ordered.  Risk Decision regarding hospitalization.   This patient presents to the ED for concern of elevated BUN and gross hematuria, this involves an extensive number of treatment options, and is a complaint that carries with it a high risk of complications and morbidity.  The differential diagnosis includes but not limited to GI bleed, cystitis, anemia, AKI, dehydration    Co morbidities / Chronic conditions that complicate the patient evaluation  DM, HTN, a fib, CABG, diastolic CHF, recent admission for subdural hematoma. Additional history as reviewed in chart   Additional history obtained:  Additional history obtained from EMR External records from  outside source obtained and reviewed including prior dc summary    Lab Tests:  I Ordered, and personally interpreted labs.  The pertinent results include:  CBC with hgb 7.5; CMP with Cr 1.48 with BUN 94; UA with large hgb. Nitrite and leukocyte + (from new foley catheter) ; hemoccult +   Cardiac Monitoring: / EKG:  The patient was maintained on a cardiac monitor.  I personally viewed and interpreted the cardiac monitored which showed an underlying rhythm of: sinus rhythm, rate 51   Problem List / ED Course / Critical interventions / Medication management  68 yo male brought in by EMS from facility for elevated BUN and gross hematuria. Patient pale, hemoccult + (stool soft and brown), patient without complaints. New foley placed, gross hematuria persists, possible UTI, started on rocephin  and sent for culture. CBC with hgb 7.5, on Eliquis . CMP with elevated BUN, Cr, provided with gentle IVF. Admitted to hospitalist service with request for GI consult.  I ordered medication including rocephin    Reevaluation of the patient after these medicines showed that the patient remained stable  I have reviewed the patients home medicines and have made adjustments as needed   Consultations Obtained:  I requested consultation with the hospitalist, Dr. Alfornia,  and discussed lab and imaging findings as well as pertinent plan - they recommend: day team to see patient for admission Secure chat to Dr. Abran with [facility] requesting GI team to see patient while admitted.    Social Determinants of Health:  Lives at facility currently    Test / Admission - Considered:  admit      Final diagnoses:  Gross hematuria  AKI (acute kidney injury)  Occult blood in  stools  Acute cystitis with hematuria    ED Discharge Orders     None          Beverley Leita LABOR, PA-C 11/13/23 0245    [name] [name] PARAS, MD 11/14/23 [phone]

## 2023-11-12 NOTE — ED Triage Notes (Signed)
 Patient arrives via [facility] from [facility] for abnormal labs. BUN 93.9 and BUN/creatine ration 64.8. Denies pain with ems. VSS.

## 2023-11-13 ENCOUNTER — Inpatient Hospital Stay (HOSPITAL_COMMUNITY)

## 2023-11-13 DIAGNOSIS — Z7901 Long term (current) use of anticoagulants: Secondary | ICD-10-CM

## 2023-11-13 DIAGNOSIS — N179 Acute kidney failure, unspecified: Secondary | ICD-10-CM

## 2023-11-13 DIAGNOSIS — K269 Duodenal ulcer, unspecified as acute or chronic, without hemorrhage or perforation: Secondary | ICD-10-CM | POA: Diagnosis not present

## 2023-11-13 DIAGNOSIS — B957 Other staphylococcus as the cause of diseases classified elsewhere: Secondary | ICD-10-CM | POA: Diagnosis present

## 2023-11-13 DIAGNOSIS — I509 Heart failure, unspecified: Secondary | ICD-10-CM | POA: Diagnosis not present

## 2023-11-13 DIAGNOSIS — F329 Major depressive disorder, single episode, unspecified: Secondary | ICD-10-CM | POA: Diagnosis present

## 2023-11-13 DIAGNOSIS — J948 Other specified pleural conditions: Secondary | ICD-10-CM | POA: Diagnosis present

## 2023-11-13 DIAGNOSIS — D509 Iron deficiency anemia, unspecified: Secondary | ICD-10-CM

## 2023-11-13 DIAGNOSIS — Z7189 Other specified counseling: Secondary | ICD-10-CM | POA: Diagnosis not present

## 2023-11-13 DIAGNOSIS — R310 Gross hematuria: Secondary | ICD-10-CM

## 2023-11-13 DIAGNOSIS — F17210 Nicotine dependence, cigarettes, uncomplicated: Secondary | ICD-10-CM | POA: Diagnosis not present

## 2023-11-13 DIAGNOSIS — R195 Other fecal abnormalities: Secondary | ICD-10-CM

## 2023-11-13 DIAGNOSIS — K31A19 Gastric intestinal metaplasia without dysplasia, unspecified site: Secondary | ICD-10-CM | POA: Diagnosis not present

## 2023-11-13 DIAGNOSIS — R7989 Other specified abnormal findings of blood chemistry: Secondary | ICD-10-CM | POA: Diagnosis present

## 2023-11-13 DIAGNOSIS — E1151 Type 2 diabetes mellitus with diabetic peripheral angiopathy without gangrene: Secondary | ICD-10-CM | POA: Diagnosis present

## 2023-11-13 DIAGNOSIS — D62 Acute posthemorrhagic anemia: Secondary | ICD-10-CM | POA: Diagnosis present

## 2023-11-13 DIAGNOSIS — D638 Anemia in other chronic diseases classified elsewhere: Secondary | ICD-10-CM | POA: Diagnosis present

## 2023-11-13 DIAGNOSIS — Y846 Urinary catheterization as the cause of abnormal reaction of the patient, or of later complication, without mention of misadventure at the time of the procedure: Secondary | ICD-10-CM | POA: Diagnosis present

## 2023-11-13 DIAGNOSIS — D6832 Hemorrhagic disorder due to extrinsic circulating anticoagulants: Secondary | ICD-10-CM | POA: Diagnosis present

## 2023-11-13 DIAGNOSIS — T83518A Infection and inflammatory reaction due to other urinary catheter, initial encounter: Secondary | ICD-10-CM | POA: Diagnosis present

## 2023-11-13 DIAGNOSIS — I110 Hypertensive heart disease with heart failure: Secondary | ICD-10-CM | POA: Diagnosis present

## 2023-11-13 DIAGNOSIS — L89153 Pressure ulcer of sacral region, stage 3: Secondary | ICD-10-CM | POA: Diagnosis present

## 2023-11-13 DIAGNOSIS — N3001 Acute cystitis with hematuria: Secondary | ICD-10-CM | POA: Diagnosis present

## 2023-11-13 DIAGNOSIS — J869 Pyothorax without fistula: Secondary | ICD-10-CM | POA: Diagnosis present

## 2023-11-13 DIAGNOSIS — R319 Hematuria, unspecified: Secondary | ICD-10-CM | POA: Diagnosis not present

## 2023-11-13 DIAGNOSIS — T45515A Adverse effect of anticoagulants, initial encounter: Secondary | ICD-10-CM | POA: Diagnosis present

## 2023-11-13 DIAGNOSIS — I2510 Atherosclerotic heart disease of native coronary artery without angina pectoris: Secondary | ICD-10-CM | POA: Diagnosis not present

## 2023-11-13 DIAGNOSIS — T83091A Other mechanical complication of indwelling urethral catheter, initial encounter: Secondary | ICD-10-CM | POA: Diagnosis present

## 2023-11-13 DIAGNOSIS — R627 Adult failure to thrive: Secondary | ICD-10-CM | POA: Diagnosis not present

## 2023-11-13 DIAGNOSIS — Z1330 Encounter for screening examination for mental health and behavioral disorders, unspecified: Secondary | ICD-10-CM | POA: Diagnosis not present

## 2023-11-13 DIAGNOSIS — I5032 Chronic diastolic (congestive) heart failure: Secondary | ICD-10-CM | POA: Diagnosis present

## 2023-11-13 DIAGNOSIS — I482 Chronic atrial fibrillation, unspecified: Secondary | ICD-10-CM | POA: Diagnosis present

## 2023-11-13 DIAGNOSIS — Z681 Body mass index (BMI) 19 or less, adult: Secondary | ICD-10-CM | POA: Diagnosis not present

## 2023-11-13 DIAGNOSIS — Z515 Encounter for palliative care: Secondary | ICD-10-CM | POA: Diagnosis not present

## 2023-11-13 DIAGNOSIS — R410 Disorientation, unspecified: Secondary | ICD-10-CM | POA: Diagnosis not present

## 2023-11-13 DIAGNOSIS — I700 Atherosclerosis of aorta: Secondary | ICD-10-CM | POA: Diagnosis present

## 2023-11-13 DIAGNOSIS — E43 Unspecified severe protein-calorie malnutrition: Secondary | ICD-10-CM | POA: Diagnosis present

## 2023-11-13 DIAGNOSIS — K2950 Unspecified chronic gastritis without bleeding: Secondary | ICD-10-CM | POA: Diagnosis not present

## 2023-11-13 DIAGNOSIS — Z538 Procedure and treatment not carried out for other reasons: Secondary | ICD-10-CM | POA: Diagnosis not present

## 2023-11-13 DIAGNOSIS — J90 Pleural effusion, not elsewhere classified: Secondary | ICD-10-CM | POA: Diagnosis not present

## 2023-11-13 DIAGNOSIS — J918 Pleural effusion in other conditions classified elsewhere: Secondary | ICD-10-CM | POA: Diagnosis present

## 2023-11-13 LAB — URINALYSIS, ROUTINE W REFLEX MICROSCOPIC
Glucose, UA: 250 mg/dL — AB
Ketones, ur: 15 mg/dL — AB
Nitrite: POSITIVE — AB
Protein, ur: >300 mg/dL — AB
Specific Gravity, Urine: 1.01 (ref 1.005–1.030)
pH: 5 (ref 5.0–8.0)

## 2023-11-13 LAB — URINALYSIS, MICROSCOPIC (REFLEX)
RBC / HPF: >50 RBC/hpf (ref 0–5)
WBC, UA: >50 WBC/hpf (ref 0–5)

## 2023-11-13 LAB — POC OCCULT BLOOD, ED: Fecal Occult Bld: POSITIVE — AB

## 2023-11-13 LAB — MRSA NEXT GEN BY PCR, NASAL: MRSA by PCR Next Gen: NOT DETECTED

## 2023-11-13 MED ORDER — ACETAMINOPHEN 500 MG PO TABS
500.0000 mg | ORAL_TABLET | Freq: Four times a day (QID) | ORAL | Status: DC | PRN
  Administered 2023-11-25 – 2023-12-10 (×3): 500 mg via ORAL
  Filled 2023-11-13 (×3): qty 1

## 2023-11-13 MED ORDER — SODIUM CHLORIDE 0.9 % IV SOLN: INTRAVENOUS | Status: AC

## 2023-11-13 MED ORDER — QUETIAPINE FUMARATE 25 MG PO TABS
25.0000 mg | ORAL_TABLET | Freq: Every day | ORAL | Status: DC
  Administered 2023-11-13 – 2023-11-25 (×13): 25 mg via ORAL
  Filled 2023-11-13 (×13): qty 1

## 2023-11-13 MED ORDER — ZINC OXIDE 20 % EX OINT
1.0000 | TOPICAL_OINTMENT | Freq: Two times a day (BID) | CUTANEOUS | Status: DC | PRN
  Filled 2023-11-13: qty 28.35

## 2023-11-13 MED ORDER — SODIUM CHLORIDE 0.9 % IV SOLN: INTRAVENOUS | Status: DC

## 2023-11-13 MED ORDER — SODIUM CHLORIDE 0.9 % IV BOLUS
500.0000 mL | Freq: Once | INTRAVENOUS | Status: AC
  Administered 2023-11-13: 500 mL via INTRAVENOUS

## 2023-11-13 MED ORDER — SODIUM CHLORIDE 0.9 % IV SOLN
1.0000 g | INTRAVENOUS | Status: DC
  Administered 2023-11-14 – 2023-11-20 (×7): 1 g via INTRAVENOUS
  Filled 2023-11-13 (×7): qty 10

## 2023-11-13 MED ORDER — PAROXETINE HCL 20 MG PO TABS
20.0000 mg | ORAL_TABLET | Freq: Every day | ORAL | Status: DC
  Administered 2023-11-13 – 2023-12-24 (×41): 20 mg via ORAL
  Filled 2023-11-13 (×43): qty 1

## 2023-11-13 MED ORDER — SODIUM CHLORIDE 0.9 % IV SOLN
1.0000 g | Freq: Once | INTRAVENOUS | Status: AC
  Administered 2023-11-13: 1 g via INTRAVENOUS
  Filled 2023-11-13: qty 10

## 2023-11-13 MED ORDER — MIRTAZAPINE 15 MG PO TABS
15.0000 mg | ORAL_TABLET | Freq: Every day | ORAL | Status: DC
  Administered 2023-11-13 – 2023-11-25 (×13): 15 mg via ORAL
  Filled 2023-11-13 (×13): qty 1

## 2023-11-13 NOTE — Plan of Care (Signed)

## 2023-11-13 NOTE — ED Notes (Signed)
 Attempted to update son, per patient request, with no answer.

## 2023-11-13 NOTE — Inpatient Diabetes Management (Signed)
 Inpatient Diabetes Program Recommendations  AACE/ADA: New Consensus Statement on Inpatient Glycemic Control (2015)  Target Ranges:  Prepandial:   less than 140 mg/dL      Peak postprandial:   less than 180 mg/dL (1-2 hours)      Critically ill patients:  140 - 180 mg/dL   Lab Results  Component Value Date   GLUCAP 137 (H) 11/06/2023   HGBA1C 9.1 (H) 07/26/2023    Review of Glycemic Control  Latest Reference Range & Units 11/05/23 11:45 11/05/23 16:30 11/05/23 21:25 11/06/23 07:49 11/06/23 11:59  Glucose-Capillary 70 - 99 mg/dL 824 (H) 818 (H) 854 (H) 139 (H) 137 (H)   Diabetes history: DM 2 Outpatient Diabetes medications: none seen on home med rec last admission had listed (Glipizide  5 mg Qam, 2.5 mg Qpm, Januvia  100 mg Daily) Current orders for Inpatient glycemic control: None  Last admission was ordered: Semglee  8 units Daily, Novolog  0-20 units tid + hs  Glucose trends at goal currently Renal function elevated  Inpatient Diabetes Program Recommendations:    -   May consider Novolog  0-9 units tid + hs if glucose trends increase.  Thanks,  Clotilda Bull RN, MSN, BC-ADM Inpatient Diabetes Coordinator Team Pager [phone] (8a-5p)

## 2023-11-13 NOTE — Consult Note (Signed)
 Consultation Note   Referring Provider:   [facility] PCP: [name], Mardy HERO, PA-C Primary Gastroenterologist:   Elspeth Naval, MD    Reason for Consultation:  Anemia, FOBT+ DOA: 11/12/2023         Hospital Day: 2   ASSESSMENT    68 year old male with worsening of chronic normocytic anemia Baseline hemoglobin mid 9 to mid 10.  Hemoglobin this admission in the upper 7 range with Hemoccult positive stool on Eliquis .  He denies overt GI blood loss. Our office has been trying to arrange for EGD and colonoscopy since June but procedures have had to be  rescheduled a couple times due to surgery /hospitalizations.   Chronic diarrhea We have been trying to coordinate outpatient colonoscopy for several months for further evaluation of diarrhea.    Possible UTI labs Hematuria, ?  Foley trauma in the setting of Eliquis  Urology to see.  On Rocephin   AKI Creatinine 1.5 (baseline around 0.9)  Moderate right pleural effusion with air-fluid levels ? Infected fluid collection / empyema  CAD status post remote CABG  Chronic HFpEF  Atrial fibrillation on Eliquis  Last dose of Eliquis  11/12/2023  History of CVA November 2024  Cognitive dysfunction  Type 2 diabetes  See PMH for any additional medical history  / medical problems  PLAN:   --Spoke with patient about proceeding with upper endoscopy and colonoscopy this admission since we have been unable to get it done as an outpatient.  He adamantly refuses to proceed with procedures this admission, tells me he has too much going on right now.  I do wonder about his ability to adequately make decisions for himself.  He was slightly confused at times during my visit.  Maybe over the next couple days he will change his mind about proceeding with endoscopic procedures.  Now is a good time since he is in the hospital off Eliquis  .   HPI   Brief History:   Patient was seen in our  office 06/06/2023 for evaluation of chronic nausea and vomiting and diarrhea, please refer to that extensive note for details.  It was also noted that he was anemic.  He was scheduled for EGD and colonoscopy to be done at the hospital due to multiple comorbidities.  There are numerous phone calls /communications in epic between the patient's son and our office since then.  Basically it appears the patient had to cancel endoscopic procedures due to eye surgery.  Procedures were rescheduled but son called requesting they be rescheduled again as patient was hospitalized at the time with a subdural hematoma following a fall.  He was discharged 11/06/2023.  Eliquis  was resumed at the time of discharge   Interval history Patient brought to ED by EMS yesterday for abnormal renal labs.  Found to have AKI.  Foley catheter was inserted.  He has since been having hematuria, possibly trauma in the setting of Eliquis .  Hemoglobin has declined slightly over the last several weeks.  He denies overt GI blood loss.  He denies abdominal pain.  No nausea nor vomiting.   Hemoglobin in August was 10.4, down to 9.5 in mid September.   Since then hemoglobin has drifted into the mid  7 to mid 8 range .  MCV 89.  Possibly has iron  deficiency anemia , difficult to tell based on iron  studies                                                               CT AP wo contrast 1. Moderate size right pleural fluid collection with new air-fluid levels suggesting possible infected fluid collection/empyema versus recent instrumentation. Stable associated compressive atelectasis over the right lower lobe. Small left pleural effusion with associated left basilar atelectasis. 2. Gallbladder is somewhat contracted with fluid level suggesting sludge versus noncalcified stones. Fluid adjacent the gallbladder. Recommend clinical correlation and possible ultrasound to exclude acute cholecystitis. 3. Minimal scattered free peritoneal fluid. 4.  Stable 3 cm aneurysm of the distal abdominal aorta. Stable aneurysmal dilatation of the common iliac arteries with the right measuring 2.6 cm and left measuring 2.2 cm. Recommend follow-up ultrasound every 3 years. 5. Aortic atherosclerosis.     Pertinent GI Studies    Colonoscopy 03/2019 - Four 12 to 17 mm polyps in the descending colon and in the transverse colon, removed with a hot snare. Resected and retrieved.  - Four 7 to 8 mm polyps in the sigmoid colon, in the transverse colon and in the ascending colon, removed with a cold snare. Resected and retrieved. - One 18 mm polyp in the sigmoid colon, removed with a hot snare. Resected and retrieved. - Internal hemorrhoids. - The examination was otherwise normal on direct and retroflexion views. Path: tubular adenomas    Labs and Imaging:  Recent Labs    11/12/23 2202  PROT 6.7  ALBUMIN 2.2*  AST 27  ALT 32  ALKPHOS 177*  BILITOT <0.2   Recent Labs    11/12/23 2202 11/12/23 2243  WBC 6.4  --   HGB 7.5* 7.8*  HCT 24.8* 23.0*  MCV 89.2  --   PLT 244  --    Recent Labs    11/12/23 2202 11/12/23 2243  NA 136 137  K 4.9 5.0  CL 100 100  CO2 27  --   GLUCOSE 235* 224*  BUN 94* 93*  CREATININE 1.48* 1.50*  CALCIUM  8.4*  --      CT ABDOMEN PELVIS WO CONTRAST CLINICAL DATA:  Abdominal/flank pain.  Possible renal stone.  EXAM: CT ABDOMEN AND PELVIS WITHOUT CONTRAST  TECHNIQUE: Multidetector CT imaging of the abdomen and pelvis was performed following the standard protocol without IV contrast.  RADIATION DOSE REDUCTION: This exam was performed according to the departmental dose-optimization program which includes automated exposure control, adjustment of the mA and/or kV according to patient size and/or use of iterative reconstruction technique.  COMPARISON:  CT abdomen/pelvis 11/24/2022, CT chest 08/18/2023  FINDINGS: Lower chest: Median sternotomy wires are present. Heart is normal size. Calcified  plaque over the descending thoracic aorta. The lung bases demonstrate a moderate size right pleural fluid collection as seen previously as there are now air-fluid levels within this pleural fluid collection suggesting possible infected fluid collection/empyema versus recent instrumentation. Stable associated compressive atelectasis over the right lower lobe. Curvilinear atelectasis over the right middle lobe. Small left pleural effusion with associated left basilar atelectasis. Mild bronchiectatic change over the left base.  Hepatobiliary: Liver and biliary tree are unremarkable. Gallbladder is somewhat contracted with fluid level suggesting sludge versus noncalcified  stones. Fluid adjacent the gallbladder.  Pancreas: Mild fatty atrophy is pancreas is otherwise unremarkable.  Spleen: Normal.  Adrenals/Urinary Tract: Adrenal glands unchanged. Kidneys are normal in size without hydronephrosis. Punctate nonobstructing stone versus vascular calcification projecting over the mid to upper pole left kidney. No focal renal mass. Ureters are normal. Foley catheter is present within the bladder with air over the nondependent portion of the bladder.  Stomach/Bowel: Stomach and small bowel are normal. Appendix is normal. Moderate fecal retention over the rectum as the colon is otherwise unremarkable.  Vascular/Lymphatic: Calcified plaque over the abdominal aorta with stable 3 cm aneurysm of the distal abdominal aorta. Stable aneurysmal dilatation of the common iliac arteries with the right measuring 2.6 cm and left measuring 2.2 cm. No adenopathy.  Reproductive: Prostate is unremarkable. Penile prosthesis with reservoir unchanged.  Other: Minimal scattered free peritoneal fluid is present.  Musculoskeletal: No focal abnormality. Stable symmetric heterogeneous sclerosis of the femoral heads  IMPRESSION: 1. Moderate size right pleural fluid collection with new air-fluid levels  suggesting possible infected fluid collection/empyema versus recent instrumentation. Stable associated compressive atelectasis over the right lower lobe. Small left pleural effusion with associated left basilar atelectasis. 2. Gallbladder is somewhat contracted with fluid level suggesting sludge versus noncalcified stones. Fluid adjacent the gallbladder. Recommend clinical correlation and possible ultrasound to exclude acute cholecystitis. 3. Minimal scattered free peritoneal fluid. 4. Stable 3 cm aneurysm of the distal abdominal aorta. Stable aneurysmal dilatation of the common iliac arteries with the right measuring 2.6 cm and left measuring 2.2 cm. Recommend follow-up ultrasound every 3 years. 5. Aortic atherosclerosis.  Aortic Atherosclerosis (ICD10-I70.0).  Electronically Signed   By: Toribio Agreste M.D.   On: 11/13/2023 10:46    Past Medical History:  Diagnosis Date   Acute urinary retention 01/04/2022   Altered mental status 08/18/2023   Anxiety    Arthritis    Cataract    CHF (congestive heart failure) (HCC)    Constipation    pt states goes EOD- hard stools    Coronary artery disease    Depression    Diabetes mellitus without complication (HCC)    Diabetic retinopathy (HCC)    DKA (diabetic ketoacidoses)    Hyperlipidemia    Hypertension    off meds   Hypertensive retinopathy    Lesion of ulnar nerve, left upper limb 07/12/2017   Non-intractable vomiting without nausea 06/10/2018   Postherpetic neuralgia 08/11/2021   Speculative diagnosis   Psoriasis    Tubular adenoma of colon 03/2019   UTI (urinary tract infection) 01/05/2022    Past Surgical History:  Procedure Laterality Date   ABDOMINAL AORTOGRAM W/LOWER EXTREMITY N/A 08/30/2020   Procedure: ABDOMINAL AORTOGRAM W/LOWER EXTREMITY;  Surgeon: Serene Gaile ORN, MD;  Location: [facility];  Service: Cardiovascular;  Laterality: N/A;   ABDOMINAL AORTOGRAM W/LOWER EXTREMITY N/A 11/15/2020    Procedure: ABDOMINAL AORTOGRAM W/LOWER EXTREMITY;  Surgeon: Serene Gaile ORN, MD;  Location: [facility];  Service: Cardiovascular;  Laterality: N/A;   ANKLE SURGERY     right   CARDIAC CATHETERIZATION  09/25/2001   COLONOSCOPY     ~10 yr ago- normal  per pt    CORONARY ARTERY BYPASS GRAFT  09/30/2001   CABG x 4   IR THORACENTESIS ASP PLEURAL SPACE W/IMG GUIDE  05/19/2020   IR THORACENTESIS ASP PLEURAL SPACE W/IMG GUIDE  02/04/2023   LAPAROSCOPIC INGUINAL HERNIA REPAIR Bilateral 02/09/2010   MASS EXCISION Left 03/12/2013   Procedure: LEFT INDEX EXCISION  MASS AND DEBRIDMENT DISTAL INTERPHALANGEAL JOINT;  Surgeon: Franky JONELLE Curia, MD;  Location: [facility];  Service: Orthopedics;  Laterality: Left;   PERIPHERAL VASCULAR BALLOON ANGIOPLASTY Left 08/30/2020   Procedure: PERIPHERAL VASCULAR BALLOON ANGIOPLASTY;  Surgeon: Serene Gaile ORN, MD;  Location: [facility];  Service: Cardiovascular;  Laterality: Left;  Peroneal   TRANSESOPHAGEAL ECHOCARDIOGRAM (CATH LAB) N/A 11/26/2022   Procedure: TRANSESOPHAGEAL ECHOCARDIOGRAM;  Surgeon: Kate Lonni CROME, MD;  Location: [name];  Service: Cardiovascular;  Laterality: N/A;    Family History  Problem Relation Age of Onset   Lymphoma Father    Cancer Paternal Grandmother        Colon Cancer   Colon cancer Paternal Grandmother    Colon polyps Neg Hx    Esophageal cancer Neg Hx    Rectal cancer Neg Hx    Stomach cancer Neg Hx    Diabetes Mellitus I Neg Hx    Pancreatic cancer Neg Hx    Stroke Neg Hx    Dementia Neg Hx     Prior to Admission medications   Medication Sig Start Date End Date Taking? Authorizing Provider  acetaminophen  (TYLENOL ) 500 MG tablet Take 500 mg by mouth every 6 (six) hours as needed for mild pain (pain score 1-3) or moderate pain (pain score 4-6).   Yes [provider]  mirtazapine  (REMERON ) 15 MG tablet TAKE 1 TABLET BY MOUTH EVERYDAY AT BEDTIME 04/23/23  Yes [name], Alyssa  M, PA-C  PARoxetine  (PAXIL ) 20 MG tablet Take 20 mg by mouth daily.   Yes [provider]  QUEtiapine  (SEROQUEL ) 25 MG tablet Take 25 mg by mouth at bedtime. 09/16/23  Yes [provider]  zinc  oxide 20 % ointment Apply 1 Application topically in the morning, at noon, and at bedtime.   Yes [provider]    Current Facility-Administered Medications  Medication Dose Route Frequency Provider Last Rate Last Admin   0.9 %  sodium chloride  infusion   Intravenous Continuous Samtani, Jai-Gurmukh, MD 100 mL/hr at 11/13/23 1019 New Bag at 11/13/23 1019   acetaminophen  (TYLENOL ) tablet 500 mg  500 mg Oral Q6H PRN Samtani, Jai-Gurmukh, MD       [START ON 11/14/2023] cefTRIAXone  (ROCEPHIN ) 1 g in sodium chloride  0.9 % 100 mL IVPB  1 g Intravenous Q24H Samtani, Jai-Gurmukh, MD       mirtazapine  (REMERON ) tablet 15 mg  15 mg Oral QHS Samtani, Jai-Gurmukh, MD       PARoxetine  (PAXIL ) tablet 20 mg  20 mg Oral Daily Samtani, Jai-Gurmukh, MD   20 mg at 11/13/23 1019   QUEtiapine  (SEROQUEL ) tablet 25 mg  25 mg Oral QHS Samtani, Jai-Gurmukh, MD       zinc  oxide 20 % ointment 1 Application  1 Application Topical BID PRN Samtani, Jai-Gurmukh, MD       Current Outpatient Medications  Medication Sig Dispense Refill   acetaminophen  (TYLENOL ) 500 MG tablet Take 500 mg by mouth every 6 (six) hours as needed for mild pain (pain score 1-3) or moderate pain (pain score 4-6).     mirtazapine  (REMERON ) 15 MG tablet TAKE 1 TABLET BY MOUTH EVERYDAY AT BEDTIME 90 tablet 1   PARoxetine  (PAXIL ) 20 MG tablet Take 20 mg by mouth daily.     QUEtiapine  (SEROQUEL ) 25 MG tablet Take 25 mg by mouth at bedtime.     zinc  oxide 20 % ointment Apply 1 Application topically in the morning, at noon, and at bedtime.  Allergies as of 11/12/2023 - Review Complete 11/12/2023  Allergen Reaction Noted   Other Hives and Itching 10/23/2021    Social History   Socioeconomic History   Marital status: Divorced     Spouse name: Not on file   Number of children: 4   Years of education: 12   Highest education level: 12th grade  Occupational History   Occupation: Retired  Tobacco Use   Smoking status: Some Days    Types: Cigars    Last attempt to quit: 2022    Years since quitting: 3.8   Smokeless tobacco: Never   Tobacco comments:    1-2 per week cigars  05/09/2023  Vaping Use   Vaping status: Never Used  Substance and Sexual Activity   Alcohol use: Not Currently    Comment: about once a year   Drug use: No   Sexual activity: Yes  Other Topics Concern   Not on file  Social History Narrative   Lives alone  son lives 3 doors down from patient    Has 4 children, 2 sons- both live in [place], 2 daughters one lives in [place], [place]  and youngest daughter attends [employer].    Fiance lives in  [place], [place]   Retired    Social Drivers of Corporate Investment Banker Strain: Low Risk  (08/19/2023)   Overall Financial Resource Strain (CARDIA)    Difficulty of Paying Living Expenses: Not hard at all  Food Insecurity: No Food Insecurity (10/23/2023)   Hunger Vital Sign    Worried About Running Out of Food in the Last Year: Never true    Ran Out of Food in the Last Year: Never true  Recent Concern: Food Insecurity - Food Insecurity Present (08/18/2023)   Hunger Vital Sign    Worried About Running Out of Food in the Last Year: Sometimes true    Ran Out of Food in the Last Year: Sometimes true  Transportation Needs: No Transportation Needs (10/23/2023)   PRAPARE - Administrator, Civil Service (Medical): No    Lack of Transportation (Non-Medical): No  Physical Activity: Inactive (11/21/2022)   Exercise Vital Sign    Days of Exercise per Week: 0 days    Minutes of Exercise per Session: 0 min  Stress: Stress Concern Present (11/21/2022)   [employer] of Occupational Health - Occupational Stress Questionnaire    Feeling of Stress : Very much  Social  Connections: Socially Isolated (10/23/2023)   Social Connection and Isolation Panel    Frequency of Communication with Friends and Family: Once a week    Frequency of Social Gatherings with Friends and Family: Once a week    Attends Religious Services: Never    Database Administrator or Organizations: No    Attends Banker Meetings: Never    Marital Status: Divorced  Catering Manager Violence: Not At Risk (10/23/2023)   Humiliation, Afraid, Rape, and Kick questionnaire    Fear of Current or Ex-Partner: No    Emotionally Abused: No    Physically Abused: No    Sexually Abused: No     Code Status   Code Status: Full Code  Review of Systems: No chest pain .  No Shortness of breath. All other systems reviewed and negative except where noted in HPI.  Physical Exam: Vital signs in last 24 hours: Temp:  [98.1 F (36.7 C)-98.3 F (36.8 C)] 98.1 F (36.7 C) (11/12 0730) Pulse Rate:  [43-63] 49 (11/12  0430) Resp:  [14-24] 21 (11/12 0730) BP: (104-125)/(50-56) 122/54 (11/12 1000) SpO2:  [93 %-100 %] 100 % (11/12 0430) Weight:  [68 kg] 68 kg (11/11 2154)    General:  Pleasant male in NAD Psych:  Cooperative. Normal mood and affect Eyes: Pupils equal Ears:  Normal auditory acuity Nose: No deformity, discharge or lesions Neck:  Supple, no masses felt Lungs:  Clear to auscultation.  Heart:  Regular rate, regular rhythm.  Abdomen:  Soft, nondistended, nontender, active bowel sounds, no masses felt Rectal :  Deferred Uro: Approximately 200 cc bloody fluid in Foley catheter bag.  Msk: Symmetrical without gross deformities.  Neurologic:  Alert, answers most but not all questions appropriately  Extremities : No edema Skin:  Intact without significant lesions.    Intake/Output from previous day: 11/11 0701 - 11/12 0700 In: 600 [IV Piggyback:600] Out: -  Intake/Output this shift:  No intake/output data recorded.   Vina Dasen, NP-C   11/13/2023, 11:01  AM

## 2023-11-13 NOTE — ED Notes (Signed)
 Breathing is even and unlabored. Call light within reach, encouraged to use when needs arise.  PT reposioned for comfort.  PT has blue soft boots on each heal. PT endorses a bedsore on bottom. Asked pt if he wanted a pillow under bottom for comfort. PT declined pt educated.

## 2023-11-13 NOTE — H&P (Signed)
 [name] HPI   [name] [name] FMW:[id number] DOB: 04-28-1955 DOA: 11/12/2023 From: Full code code Status [facility]  PCP: [name] HERO, PA-C   Chief Complaint: Abnormal labs  HPI:  68 year old white male Chronic A-fib with slow ventricular response CHADVASC 4 on apixaban , CABG X4 2003-aortic aneurysm repair 2003 CVA 11/21/2022 (noncompliance with Eliquis )-DM TY 2 HLD HFpEF EF 55-60%-baseline NYHA III Sacral decubitus ulcer at baseline He also does have chronic fecal incontinence and has colonoscopy scheduled with Dr. Leigh 10/14 Recent retinal surgery --cannot drive  Took care of patient 9/12 through 9/15 and discharged him after being admitted for toxic metabolic encephalopathy AKI foot ulcers--- he does have a chronic Foley catheter  Readmitted 10/22 through 11/5 with a fall subdural hematoma 4 mm on the left no midline shift Eliquis  was held and subsequently Eliquis  restarted diuretics were adjusted as were meds for depression and he was told to follow-up with neurosurgery at [facility] in 1 to 2 weeks his discharge creatinine was 0.8  He was sent over from [facility] for abnormal labs as below It seems like at the last admission through 11/5 he might have been placed on Foley catheter-I am unable to verify lately although patient thinks so He tells me he has no abdominal pain no fever no chills no nausea no vomiting  I feels fine-he has not really been walking or ambulating at [facility] that he is able to when he has no limitations I just follow what my son says  He can tell me date time year person and is quite oriented--he does miss that the end of this month since Thanksgiving but corrects himself quickly  Review of Systems:  As mentioned above in HPI are pertinent +'s Pertinent negatives as per below  No chest pain no fever no chills no nausea no vomiting no blurred vision no double vision   ED Course: Hemoccult was  done to rule out black stool-stool was brown but Hemoccult was positive and GI was consulted Patient was given bolus of 500 cc of fluids started on Rocephin  for presumed superimposed infection and hospitalist was consulted   Past Medical History:  Diagnosis Date   Acute urinary retention 01/04/2022   Altered mental status 08/18/2023   Anxiety    Arthritis    Cataract    CHF (congestive heart failure) (HCC)    Constipation    pt states goes EOD- hard stools    Coronary artery disease    Depression    Diabetes mellitus without complication (HCC)    Diabetic retinopathy (HCC)    DKA (diabetic ketoacidoses)    Hyperlipidemia    Hypertension    off meds   Hypertensive retinopathy    Lesion of ulnar nerve, left upper limb 07/12/2017   Non-intractable vomiting without nausea 06/10/2018   Postherpetic neuralgia 08/11/2021   Speculative diagnosis   Psoriasis    Tubular adenoma of colon 03/2019   UTI (urinary tract infection) 01/05/2022   Past Surgical History:  Procedure Laterality Date   ABDOMINAL AORTOGRAM W/LOWER EXTREMITY N/A 08/30/2020   Procedure: ABDOMINAL AORTOGRAM W/LOWER EXTREMITY;  Surgeon: Serene Gaile ORN, MD;  Location: [facility];  Service: Cardiovascular;  Laterality: N/A;   ABDOMINAL AORTOGRAM W/LOWER EXTREMITY N/A 11/15/2020   Procedure: ABDOMINAL AORTOGRAM W/LOWER EXTREMITY;  Surgeon: Serene Gaile ORN, MD;  Location: [facility];  Service: Cardiovascular;  Laterality: N/A;   ANKLE SURGERY     right   CARDIAC CATHETERIZATION  09/25/2001  COLONOSCOPY     ~10 yr ago- normal  per pt    CORONARY ARTERY BYPASS GRAFT  09/30/2001   CABG x 4   IR THORACENTESIS ASP PLEURAL SPACE W/IMG GUIDE  05/19/2020   IR THORACENTESIS ASP PLEURAL SPACE W/IMG GUIDE  02/04/2023   LAPAROSCOPIC INGUINAL HERNIA REPAIR Bilateral 02/09/2010   MASS EXCISION Left 03/12/2013   Procedure: LEFT INDEX EXCISION MASS AND DEBRIDMENT DISTAL INTERPHALANGEAL JOINT;  Surgeon: Franky JONELLE Curia, MD;   Location: [facility];  Service: Orthopedics;  Laterality: Left;   PERIPHERAL VASCULAR BALLOON ANGIOPLASTY Left 08/30/2020   Procedure: PERIPHERAL VASCULAR BALLOON ANGIOPLASTY;  Surgeon: Serene Gaile ORN, MD;  Location: [facility];  Service: Cardiovascular;  Laterality: Left;  Peroneal   TRANSESOPHAGEAL ECHOCARDIOGRAM (CATH LAB) N/A 11/26/2022   Procedure: TRANSESOPHAGEAL ECHOCARDIOGRAM;  Surgeon: Kate Lonni CROME, MD;  Location: [facility] INVASIVE CV LAB;  Service: Cardiovascular;  Laterality: N/A;    reports that he has been smoking cigars. He has never used smokeless tobacco. He reports that he does not currently use alcohol. He reports that he does not use drugs.  Mobility: Is able to ambulate with a rollator  Allergies  Allergen Reactions   Other Hives and Itching    Wool fabric - hives, itching   Family History  Problem Relation Age of Onset   Lymphoma Father    Cancer Paternal Grandmother        Colon Cancer   Colon cancer Paternal Grandmother    Colon polyps Neg Hx    Esophageal cancer Neg Hx    Rectal cancer Neg Hx    Stomach cancer Neg Hx    Diabetes Mellitus I Neg Hx    Pancreatic cancer Neg Hx    Stroke Neg Hx    Dementia Neg Hx    Prior to Admission medications   Medication Sig Start Date End Date Taking? Authorizing Provider  acetaminophen  (TYLENOL ) 500 MG tablet Take 500 mg by mouth every 6 (six) hours as needed for mild pain (pain score 1-3) or moderate pain (pain score 4-6).   Yes [provider]  Amino Acids-Protein Hydrolys (FEEDING SUPPLEMENT, PRO-STAT SUGAR FREE 64,) LIQD Take 30 mLs by mouth in the morning and at bedtime.   Yes [provider]  amLODipine  (NORVASC ) 10 MG tablet Take 1 tablet (10 mg total) by mouth daily. 11/07/23  Yes Danford, Lonni SQUIBB, MD  atorvastatin  (LIPITOR) 40 MG tablet Take 1 tablet (40 mg total) by mouth daily. 08/30/23  Yes [name], [name], PA-C  Cholecalciferol  (VITAMIN D3) 50 MCG (2000  UT) capsule Take 4,000 Units by mouth daily.   Yes [provider]  cyanocobalamin  1000 MCG tablet Take 1 tablet (1,000 mcg total) by mouth daily. 11/07/23  Yes Danford, Lonni SQUIBB, MD  docusate sodium (COLACE) 100 MG capsule Take 100 mg by mouth 2 (two) times daily.   Yes [provider]  ELIQUIS  5 MG TABS tablet TAKE 1 TABLET BY MOUTH TWICE A DAY 10/14/23  Yes Krasowski, Robert J, MD  ferrous sulfate 325 (65 FE) MG tablet Take 325 mg by mouth daily with breakfast.   Yes [provider]  glipiZIDE  (GLUCOTROL ) 5 MG tablet Take 1 tablet (5 mg total) by mouth 2 (two) times daily before a meal. 08/22/23  Yes Shamleffer, Donell Cardinal, MD  loratadine  (CLARITIN ) 10 MG tablet Take 10 mg by mouth daily as needed for allergies.   Yes [provider]  losartan  (COZAAR ) 100 MG tablet Take  1 tablet (100 mg total) by mouth daily. 11/07/23  Yes Danford, Lonni SQUIBB, MD  mirtazapine  (REMERON ) 15 MG tablet TAKE 1 TABLET BY MOUTH EVERYDAY AT BEDTIME 04/23/23  Yes [name], [name], PA-C  Multiple Vitamin (MULTIVITAMIN WITH MINERALS) TABS tablet Take 1 tablet by mouth daily. 11/07/23  Yes Danford, Lonni SQUIBB, MD  nutrition supplement, JUVEN, (JUVEN) PACK Take 1 packet by mouth 2 (two) times daily between meals. 11/06/23  Yes Danford, Lonni SQUIBB, MD  PARoxetine  (PAXIL ) 20 MG tablet Take 20 mg by mouth daily.   Yes [provider]  QUEtiapine  (SEROQUEL ) 25 MG tablet Take 25 mg by mouth at bedtime. 09/16/23  Yes [provider]  sitaGLIPtin  (JANUVIA ) 100 MG tablet Take 100 mg by mouth daily.   Yes [provider]  zinc  oxide 20 % ointment Apply 1 Application topically in the morning, at noon, and at bedtime.   Yes [provider]  feeding supplement (ENSURE PLUS HIGH PROTEIN) LIQD Take 237 mLs by mouth 2 (two) times daily between meals. Patient not taking: Reported on 11/13/2023 11/06/23   Jonel Lonni SQUIBB, MD    Physical  Exam:  Vitals:   11/13/23 0430 11/13/23 0515  BP: (!) 113/54 (!) 110/55  Pulse: (!) 49   Resp: 15 15  Temp:    SpO2: 100%     Awake coherent no distress no icterus no pallor Bitemporal wasting Abdomen soft no rebound no guarding Foley catheter in place No CVA tenderness He does have a little bit of contractures of his lower extremities he is wearing proper lungs he has a small wound at the lateral aspect of his left foot His chest is clear no wheeze no rales Power is 5/5   I have personally reviewed following labs and imaging studies  Labs:  Sodium 136 potassium 4.9 CO2 27 BUN/creatinine 90/1.4 alk phos 177 albumin 2.2 AST/ALT 27/32 Hemoglobin 7.5 platelet 244 WBC 6.4 (baseline hemoglobin in the 8 range)  Imaging studies:  No imaging studies performed  Medical tests:  EKG independently reviewed: My over read shows A-fib PVCs with slow ventricular response    Test discussed with performing physician: Discussed with urologist  Decision to obtain old records:  Yes  Review and summation of old records:  Yes  Active Problems:   * No active hospital problems. *   Assessment/Plan Gross hematuria?  UTI  Etiology likely secondary to Foley trauma in the setting of apixaban  Holding Eliquis  at this time have involved urology in discussion and will get a stat CT abdomen pelvis without contrast to see if there are blood clots and if there is a need for CBT Give saline 100 cc/H for 18 hours Giving IV fluids Urology PA Mr. Ole has been consulted and he will see her in consult to determine if CBT is required although it looks like the urine may be clearing I cannot verify if there are clots or not Mild anemia superimposed on chronic anemia The patient's baseline hemoglobin is usually in the low 8 range about 2 months ago it was in the 10 range I think last iron  study showed anemia of chronic disease Patient can resume iron  at discharge AKI on admission secondary to  obstructive uropathy/hematuria- -presumed UTI on admission? IV fluids as above Cover with empiric Rocephin  at this time and follow urine culture Losartan  ARB has been held Atrial fibrillation slow ventricular response CHADVASC 4 CHADVASC is elevated but has bled score is also elevated-would hold Eliquis  as above and  likely will need to resume once the hematuria clears Given this is his second bleed in the past month or so may need to stop completely after full discussion with his son which I will try to carry out later on today note that he is also had a stroke in 11/2022 so risk-benefit discussion needs to occur He is not on any rate controlling agent because of slow ventricular response Hemoccult positive stool in the setting of brown stool Previous history of tubular adenoma 04/01/2019 multiple polyps all negative for high-grade dysplasia No overt blood noted per rectum-May need nonemergent scope but will update GI who was consulted by admitting physician-Will allow full liquid diet for now until he is seen Not sure if he needs inpatient workup could certainly be scheduled as an outpatient if no gross bleeding noted at bedside later on this admission History of CABG X4 with aortic aneurysm in 2003 Chronic HFpEF Peripheral vascular disease This is clinically stable at this time-not on statin or GDMT currently-May need to resume the same-has a history of peripheral vascular disease as well Outpatient follow-up Diuretics were stopped at last admission--ARB held as above I would not resume previous dosing of diuretics and may be only put on as needed Reported history of orthostasis  Hold diuretics monitor with fluid as above Ambulatory dysfunction sacral decubitus stage II prior to admission Left foot wound on lateral aspect near small toe Wound care nurse to see for all above--sacral decubitus is stage I Turn frequently-foam barrier pad daily Generalized anxiety disorder Cognitive  dysfunction MMSE previously done was 24/30 based on prior notes I will discuss further with the patient's son For now we can continue cautiously his Remeron  15 at bedtime Paxil  20 daily and Seroquel  25 at bedtime although these may need to be adjusted   Severity of Illness: The appropriate patient status for this patient is INPATIENT. Inpatient status is judged to be reasonable and necessary in order to provide the required intensity of service to ensure the patient's safety. The patient's presenting symptoms, physical exam findings, and initial radiographic and laboratory data in the context of their chronic comorbidities is felt to place them at high risk for further clinical deterioration. Furthermore, it is not anticipated that the patient will be medically stable for discharge from the hospital within 2 midnights of admission.   * I certify that at the point of admission it is my clinical judgment that the patient will require inpatient hospital care spanning beyond 2 midnights from the point of admission due to high intensity of service, high risk for further deterioration and high frequency of surveillance required.*   Family Communication: yes [name] [name] [phone]--leijuzi fully on the phone I updated him with regards to need for probable CT scan of abdomen and neurology input Because we do not have a real clear cause for his azotemia may need GI to weigh in although may not require emergent colonoscopy Main concern from the patient's son is that patient be discharged back to [facility] is involved in the patient's case because he is a danger to himself and unable to care for himself properly because of some cognitive issues  DVT ppx: SCD Consults called & Whom: Urology--- made gastroenterology aware  Time spent: 90 minutes  Royal, MD dufm my NP partners at night for Care related issues] [facility] --Via amion app OR , [email or web address]; password [facility]   11/13/2023, 6:47 AM

## 2023-11-13 NOTE — Progress Notes (Signed)
 Pt admitted from [facility] for STR. CSW spoke with Kristin at [facility]; pt's son opted not to hold bed, so bed is no longer available. If plan remains for STR, pt will require a new SNF workup. [facility] SW [place] ([phone]) requested an update on pt dc plan.

## 2023-11-13 NOTE — Consult Note (Addendum)
 Urology Consult Note   Requesting Attending Physician:  Samtani, Jai-Gurmukh, MD Service Providing Consult: Urology  Consulting Attending: Dr. Elisabeth   Reason for Consult: Gross hematuria  HPI: [name] [name] is seen in consultation for reasons noted above at the request of Royal Sill, MD. Patient is a 68 y.o. male presenting to [facility] emergency department [facility], with concern for elevated BUN.  He was also noted to have blood in his Foley bag on arrival.  He remains on Eliquis  following CVA on 11/21/2022, CABG x 4 in 2003 with aortic aneurysm repair, and chronic A-fib with slow ventricular response.  Past urologic history includes remote treatment with [facility] for BPH and ED in 2009.  He has been seen by Dr. Eddye of [facility] for IPP placement and treatment of Peyronie's plaque following [employer] injections.  He has not been seen by our practice and does not recall seeing a urologist in quite some time.  [facility] was consulted to speak to findings of blood in foley catheter.  On my arrival patient was alert, somewhat confused, and, and in no distress.  He has no recollection when his Foley catheter was placed or how long he has required it.  It does look to be very recently placed or replaced.  Foley catheter with clear medium red urine.  No clot material appreciated.   ------------------  Assessment:   68 y.o. male with gross hematuria on chronic anticoagulation   Recommendations: # Gross hematuria #AKI  Baseline serum creatinine around 1.0, 1.48 on arrival today.  Would ultimately benefit from CT hematuria but AKI prevents that at this time.  Noncon CT with thickened bladder wall, consistent with cystitis.  Compared to most recent abdominal CT imaging collected on 11/24/2022, there there is not evidence of chronic bladder thickening from BPH.  Gas in bladder, possibly from hand irrigation I performed prior to  imaging  Unable to determine if his gross hematuria secondary to hemorrhagic cystitis.  Urinalysis is color contaminated with high quantity of squamous cells.  I doubt we will get a beneficial culture result.  Would recommend prophylactic treatment of UTI considering the bladder thickening and possible gas.  Hand irrigated Foley catheter at the bedside.  Bladder cleared with about 50 cc of sterile saline.  I continued to hand irrigate without return of clot material.    If medically reasonable, he would benefit from a short holiday from anticoagulation.  As he is bedbound, there is a high probability that some or all of his bleeding represents Foley trauma sustained during regular patient care, in the context of chronic anticoagulation.  No surgical interventions or CBI indicated at this time.  He would like to establish with a local urologist.  Outpatient cystoscopy.  Please call with questions.  Case and plan discussed with Dr. Elisabeth  Past Medical History: Past Medical History:  Diagnosis Date   Acute urinary retention 01/04/2022   Altered mental status 08/18/2023   Anxiety    Arthritis    Cataract    CHF (congestive heart failure) (HCC)    Constipation    pt states goes EOD- hard stools    Coronary artery disease    Depression    Diabetes mellitus without complication (HCC)    Diabetic retinopathy (HCC)    DKA (diabetic ketoacidoses)    Hyperlipidemia    Hypertension    off meds   Hypertensive retinopathy    Lesion of ulnar nerve, left upper limb 07/12/2017  Non-intractable vomiting without nausea 06/10/2018   Postherpetic neuralgia 08/11/2021   Speculative diagnosis   Psoriasis    Tubular adenoma of colon 03/2019   UTI (urinary tract infection) 01/05/2022    Past Surgical History:  Past Surgical History:  Procedure Laterality Date   ABDOMINAL AORTOGRAM W/LOWER EXTREMITY N/A 08/30/2020   Procedure: ABDOMINAL AORTOGRAM W/LOWER EXTREMITY;  Surgeon: Serene Gaile ORN, MD;   Location: [facility];  Service: Cardiovascular;  Laterality: N/A;   ABDOMINAL AORTOGRAM W/LOWER EXTREMITY N/A 11/15/2020   Procedure: ABDOMINAL AORTOGRAM W/LOWER EXTREMITY;  Surgeon: Serene Gaile ORN, MD;  Location: [facility];  Service: Cardiovascular;  Laterality: N/A;   ANKLE SURGERY     right   CARDIAC CATHETERIZATION  09/25/2001   COLONOSCOPY     ~10 yr ago- normal  per pt    CORONARY ARTERY BYPASS GRAFT  09/30/2001   CABG x 4   IR THORACENTESIS ASP PLEURAL SPACE W/IMG GUIDE  05/19/2020   IR THORACENTESIS ASP PLEURAL SPACE W/IMG GUIDE  02/04/2023   LAPAROSCOPIC INGUINAL HERNIA REPAIR Bilateral 02/09/2010   MASS EXCISION Left 03/12/2013   Procedure: LEFT INDEX EXCISION MASS AND DEBRIDMENT DISTAL INTERPHALANGEAL JOINT;  Surgeon: Franky JONELLE Curia, MD;  Location: [facility];  Service: Orthopedics;  Laterality: Left;   PERIPHERAL VASCULAR BALLOON ANGIOPLASTY Left 08/30/2020   Procedure: PERIPHERAL VASCULAR BALLOON ANGIOPLASTY;  Surgeon: Serene Gaile ORN, MD;  Location: [facility];  Service: Cardiovascular;  Laterality: Left;  Peroneal   TRANSESOPHAGEAL ECHOCARDIOGRAM (CATH LAB) N/A 11/26/2022   Procedure: TRANSESOPHAGEAL ECHOCARDIOGRAM;  Surgeon: Kate Lonni CROME, MD;  Location[facility] INVASIVE CV LAB;  Service: Cardiovascular;  Laterality: N/A;    Medication: Current Facility-Administered Medications  Medication Dose Route Frequency Provider Last Rate Last Admin   0.9 %  sodium chloride  infusion   Intravenous Continuous Samtani, Jai-Gurmukh, MD       acetaminophen  (TYLENOL ) tablet 500 mg  500 mg Oral Q6H PRN Samtani, Jai-Gurmukh, MD       [START ON 11/14/2023] cefTRIAXone  (ROCEPHIN ) 1 g in sodium chloride  0.9 % 100 mL IVPB  1 g Intravenous Q24H Samtani, Jai-Gurmukh, MD       mirtazapine  (REMERON ) tablet 15 mg  15 mg Oral QHS Samtani, Jai-Gurmukh, MD       PARoxetine  (PAXIL ) tablet 20 mg  20 mg Oral Daily Samtani, Jai-Gurmukh, MD       QUEtiapine  (SEROQUEL )  tablet 25 mg  25 mg Oral QHS Samtani, Jai-Gurmukh, MD       zinc  oxide 20 % ointment 1 Application  1 Application Topical BID PRN Samtani, Jai-Gurmukh, MD       Current Outpatient Medications  Medication Sig Dispense Refill   acetaminophen  (TYLENOL ) 500 MG tablet Take 500 mg by mouth every 6 (six) hours as needed for mild pain (pain score 1-3) or moderate pain (pain score 4-6).     mirtazapine  (REMERON ) 15 MG tablet TAKE 1 TABLET BY MOUTH EVERYDAY AT BEDTIME 90 tablet 1   PARoxetine  (PAXIL ) 20 MG tablet Take 20 mg by mouth daily.     QUEtiapine  (SEROQUEL ) 25 MG tablet Take 25 mg by mouth at bedtime.     zinc  oxide 20 % ointment Apply 1 Application topically in the morning, at noon, and at bedtime.      Allergies: Allergies  Allergen Reactions   Other Hives and Itching    Wool fabric - hives, itching    Social History: Social History   Tobacco Use  Smoking status: Some Days    Types: Cigars    Last attempt to quit: 2022    Years since quitting: 3.8   Smokeless tobacco: Never   Tobacco comments:    1-2 per week cigars  05/09/2023  Vaping Use   Vaping status: Never Used  Substance Use Topics   Alcohol use: Not Currently    Comment: about once a year   Drug use: No    Family History Family History  Problem Relation Age of Onset   Lymphoma Father    Cancer Paternal Grandmother        Colon Cancer   Colon cancer Paternal Grandmother    Colon polyps Neg Hx    Esophageal cancer Neg Hx    Rectal cancer Neg Hx    Stomach cancer Neg Hx    Diabetes Mellitus I Neg Hx    Pancreatic cancer Neg Hx    Stroke Neg Hx    Dementia Neg Hx     Review of Systems  Genitourinary:  Positive for hematuria. Negative for dysuria, flank pain, frequency and urgency.     Objective   Vital signs in last 24 hours: BP (!) 113/51   Pulse (!) 49   Temp 98.1 F (36.7 C) (Oral)   Resp (!) 21   Ht 6' 3 (1.905 m)   Wt 68 kg   SpO2 100%   BMI 18.75 kg/m   Physical Exam General:  confused HEENT: [place]/AT Pulmonary: Normal work of breathing Cardiovascular: no cyanosis Abdomen: Soft, NTTP, nondistended GU: IPP, foley catheter in place draining clear medium red urine without clot    Most Recent Labs: Lab Results  Component Value Date   WBC 6.4 11/12/2023   HGB 7.8 (L) 11/12/2023   HCT 23.0 (L) 11/12/2023   PLT 244 11/12/2023    Lab Results  Component Value Date   NA 137 11/12/2023   K 5.0 11/12/2023   CL 100 11/12/2023   CO2 27 11/12/2023   BUN 93 (H) 11/12/2023   CREATININE 1.50 (H) 11/12/2023   CALCIUM  8.4 (L) 11/12/2023   MG 1.9 10/30/2023   PHOS 3.9 02/07/2023    Lab Results  Component Value Date   INR 1.6 (H) 11/12/2023     Urine Culture: @[id number] (laburin,org,[id number],[id number])@   IMAGING: No results found.  ------  Ole Bourdon, NP Pager: [phone]   Please contact the urology consult pager with any further questions/concerns.

## 2023-11-14 DIAGNOSIS — J869 Pyothorax without fistula: Secondary | ICD-10-CM

## 2023-11-14 DIAGNOSIS — N179 Acute kidney failure, unspecified: Secondary | ICD-10-CM | POA: Diagnosis not present

## 2023-11-14 DIAGNOSIS — J90 Pleural effusion, not elsewhere classified: Secondary | ICD-10-CM

## 2023-11-14 DIAGNOSIS — D509 Iron deficiency anemia, unspecified: Secondary | ICD-10-CM | POA: Diagnosis not present

## 2023-11-14 DIAGNOSIS — R195 Other fecal abnormalities: Secondary | ICD-10-CM | POA: Diagnosis not present

## 2023-11-14 DIAGNOSIS — R319 Hematuria, unspecified: Secondary | ICD-10-CM | POA: Diagnosis not present

## 2023-11-14 DIAGNOSIS — R310 Gross hematuria: Secondary | ICD-10-CM | POA: Diagnosis not present

## 2023-11-14 LAB — CBC
HCT: 21.5 % — ABNORMAL LOW (ref 39.0–52.0)
Hemoglobin: 6.7 g/dL — CL (ref 13.0–17.0)
MCH: 27.1 pg (ref 26.0–34.0)
MCHC: 31.2 g/dL (ref 30.0–36.0)
MCV: 87 fL (ref 80.0–100.0)
Platelets: 200 K/uL (ref 150–400)
RBC: 2.47 MIL/uL — ABNORMAL LOW (ref 4.22–5.81)
RDW: 16.8 % — ABNORMAL HIGH (ref 11.5–15.5)
WBC: 4.7 K/uL (ref 4.0–10.5)
nRBC: 0 % (ref 0.0–0.2)

## 2023-11-14 LAB — COMPREHENSIVE METABOLIC PANEL WITH GFR
ALT: 26 U/L (ref 0–44)
AST: 17 U/L (ref 15–41)
Albumin: 1.9 g/dL — ABNORMAL LOW (ref 3.5–5.0)
Alkaline Phosphatase: 132 U/L — ABNORMAL HIGH (ref 38–126)
Anion gap: 7 (ref 5–15)
BUN: 52 mg/dL — ABNORMAL HIGH (ref 8–23)
CO2: 24 mmol/L (ref 22–32)
Calcium: 8 mg/dL — ABNORMAL LOW (ref 8.9–10.3)
Chloride: 107 mmol/L (ref 98–111)
Creatinine, Ser: 0.79 mg/dL (ref 0.61–1.24)
GFR, Estimated: >60 mL/min (ref >60)
Glucose, Bld: 208 mg/dL — ABNORMAL HIGH (ref 70–99)
Potassium: 4.3 mmol/L (ref 3.5–5.1)
Sodium: 138 mmol/L (ref 135–145)
Total Bilirubin: 0.4 mg/dL (ref 0.0–1.2)
Total Protein: 5.9 g/dL — ABNORMAL LOW (ref 6.5–8.1)

## 2023-11-14 LAB — PREPARE RBC (CROSSMATCH)

## 2023-11-14 LAB — HEMOGLOBIN AND HEMATOCRIT, BLOOD
HCT: 26.8 % — ABNORMAL LOW (ref 39.0–52.0)
Hemoglobin: 8.5 g/dL — ABNORMAL LOW (ref 13.0–17.0)

## 2023-11-14 LAB — GLUCOSE, CAPILLARY
Glucose-Capillary: 174 mg/dL — ABNORMAL HIGH (ref 70–99)
Glucose-Capillary: 267 mg/dL — ABNORMAL HIGH (ref 70–99)
Glucose-Capillary: 164 mg/dL — ABNORMAL HIGH (ref 70–99)

## 2023-11-14 MED ORDER — SODIUM CHLORIDE 0.9% IV SOLUTION: Freq: Once | INTRAVENOUS | Status: AC

## 2023-11-14 MED ORDER — INSULIN ASPART 100 UNIT/ML IJ SOLN
0.0000 [IU] | Freq: Three times a day (TID) | INTRAMUSCULAR | Status: DC
  Administered 2023-11-14: 3 [IU] via SUBCUTANEOUS
  Administered 2023-11-14 – 2023-11-15 (×3): 1 [IU] via SUBCUTANEOUS
  Administered 2023-11-16 – 2023-11-17 (×3): 2 [IU] via SUBCUTANEOUS
  Administered 2023-11-17 – 2023-11-18 (×2): 1 [IU] via SUBCUTANEOUS
  Filled 2023-11-14: qty 3
  Filled 2023-11-14: qty 1
  Filled 2023-11-14: qty 2
  Filled 2023-11-14 (×3): qty 1
  Filled 2023-11-14 (×2): qty 2

## 2023-11-14 MED ORDER — METRONIDAZOLE 500 MG/100ML IV SOLN
500.0000 mg | Freq: Three times a day (TID) | INTRAVENOUS | Status: DC
  Administered 2023-11-14 – 2023-11-20 (×19): 500 mg via INTRAVENOUS
  Filled 2023-11-14 (×19): qty 100

## 2023-11-14 MED ORDER — CHLORHEXIDINE GLUCONATE CLOTH 2 % EX PADS
6.0000 | MEDICATED_PAD | Freq: Every day | CUTANEOUS | Status: DC
  Administered 2023-11-14 – 2023-12-23 (×39): 6 via TOPICAL

## 2023-11-14 NOTE — Progress Notes (Signed)
 Blood transfusion ordered by Andrez NP but patient is requesting his son to sign the consent. Called and left a message for Antoney Biven to call back to the unit  for the consent.

## 2023-11-14 NOTE — Hospital Course (Addendum)
 [name] [name] is a 68 y.o. male with a history of atrial fibrillation with slow ventricular response, CAD, CABG x 4, aortic aneurysm s/p repair, CVA, diabetes mellitus type 2, heart failure with preserved EF. Patient presented secondary to abnormal metabolic panel and while in the emergency department found to have evidence of gross hematuria, anemia and AKI. Urology consulted. Patient started IV fluids. During workup, patient was Hemoccult positive, GI consulted. Hemoglobin continued to drift down requiring 1 unit PRBC via transfusion. CT imaging on admission was significant for right pleural effusion concerning for possible empyema. Pulmonology consulted for chest tube placement which was performed on 11/14 which was removed on 11/17.  Pulmonary signed off the case on 11/19/2023.   Subsequently underwent workup for his acute blood loss anemia superimposed on chronic hydration deficiency anemia with an upper endoscopy and was found to have gastric and duodenal ulcers.  Has been placed on twice daily PPI for now.  Patient is now agreeable for SNF and medically stable for discharge however insurance has denied the authorization and family has appealed Denial.  Assessment and Plan:  Gross Hematuria: Possibly related to foley catheter trauma and complicated by Eliquis  use. Urology consulted. CT abdomen/pelvis significant for normal kidneys without hydronephrosis, nonobstructing stone versus vascular calcification of the upper pole left kidney, no renal mass.  Hematuria catheter was declined by patient.  Per urology, possible hemorrhagic cystitis. Hematuria catheter placed and CBI started on 11/14. Ur Cx + for 20,000 CFU of Staphylococcus Hemolyticus. Patient completed antibiotics. Bleeding improved. Recommendation to c/w Finasteride  5 mg po Daily on discharge.   Possible UTI: Urine culture obtained. Patient started empirically on Ceftriaxone  and transitioned to Bactrim . Urinalysis with rare bacteruria,  pyuria and is nitrite positive; significant squamous cells. Urine culture positive for 20,000 colonies/ml of staphylococcus hemolyticus. Patient completed Bactrim  and Abx.  Urinary retention: Trial of void done however failed so we will continue catheter drainage at discharge and will need outpatient Urology follow-up with continuation of Finasteride .    Acute Blood Loss Anemia superimposed on Chronic IDA: Baseline hemoglobin appears to be around 8-9.  Hemoglobin of 7.5 on admission with downtrend to 6.7 likely related to patient's gross hematuria in addition to heme positive stool.  1 unit of PRBC ordered on 11/13 with posttransfusion hemoglobin up to 8.5.  Hemoglobin/Hct Trend stable and last check was 8.8/27.3. CTM for S/Sx of Bleeding no overt bleeding noted.    AKI: Present on admission with creatinine of 1.48 and peak of 1.50. Resolved. BUN/Cr now 8/0.68 on the last check. Avoid Nephrotoxic Medications, Contrast Dyes, Hypotension and Dehydration to Ensure Adequate Renal Perfusion and will need to Renally Adjust Meds. CTM &Trend Renal Function carefully & repeat CMP intermittently   Heme positive stool: No history of hematochezia or melena. BUN was significantly elevated on admission, but patient also has evidence of dehydration.  Complicated by Apixaban  use. GI consulted and have recommended inpatient EGD/Colonoscopy. Patient initially declined endoscopic procedures, but eventually agreed. Upper endoscopy performed on 11/23 significant for non-ulcer gastritis (biopsied) and multiple non-bleeding duodenal ulcers (biopsied). Plan for colonoscopy was deferred secondary to patient refusing bowel prep (initial view on colonoscopy was poor prep). GI signed off. Patient continues to have no melena or hematochezia even with anticoagulation restarted. Hgb has been stable.    Gastric Ulcer and Duodenal ulcers: Noted on upper endoscopy. Pathology consistent with chronic peptic duodenitis (no dysplasia or  malignancy) and mild chronic inactive gastritis (no H. Pylori identified, no metaplasia or dyspasia). GI  recommended PPI with Pantoprazole  40 mg BID x6 weeks   Paroxysmal A Fib with slow ventricular response: Patient is not on rate control medication secondary to slow ventricular response.  He is managed on Apixaban  secondary to a CHA2DS2-VASc of 4. Apixaban  5 mg po BID had been held secondary to hematuria and associated acute anemia but resumed 11/26. CTM   Right Pleural Effusion: Noted on CT imaging with evidence of moderate-sized collection.  Concern for possible empyema secondary to gas seen on imaging.  Last thoracentesis was from February 2025, which was also right-sided and was secondary to heart failure.  Patient is completely asymptomatic.  Pulmonology consulted and placed a right-sided chest tube on 11/14 and applied lytics for management of loculated effusion. Chest tube successfully removed on 11/17. Antibiotics completed.  With IV ceftriaxone  and Flagyl .  Pulmonary signed off the case on 11/19/2023 and will need outpatient follow-up and repeat CXR in the outpt setting.   DMT2: Uncontrolled with hyperglycemia based on last hemoglobin A1c of 8.3%. C/w Sensitive Novolog  SSI AC/HS. CTM CBGs per Protocol. CBG Trend ranging from 149-308 on the last 7 checks   CAD: S/p CABG x4. He had been resumed aspirin  81 mg p.o. daily previously but was held due to his EGD.  No longer undergoing colonoscopy inpatient.  Will resume now   Aortic Atherosclerosis: Noted on CT imaging.   Aortic aneurysm: Stable.   Chronic HFpEF: Currently stable. Strict I's and O's and Daily Weights. CTM for S/Sx of Volume Overload.    Peripheral Vascular Disease: Noted. Restart ASA 81 mg po Daily. Already back on DOAC with Apixaban  5 mg po BID   Ambulatory Dysfunction: Noted. PT/OT recommending SNF and now patient is agreeable.  Received a request for peer to peer for SNF and I called Dr. Janit and rang several times and then  it went to his voicemail.  I did not receive a callback.  Apparently [facility] has denied the patient for SNF.  Family (Son)  is appealing the denial for the patient and awaiting appeal process and decision; Remains medically stable for discharge   Left Foot Wound: Noted. C/w Wound Care reccs per WOC Nurse. Recent CT Scan showed cutaneous thickening and irregularity corresponded to wound along the lateral and plantar forefoot, overlying the level of the distal fifth metatarsal. No appreciable osteolysis or erosive changes of the underlying fifth metatarsal to suggest acute osteomyelitis, No loculated fluid collection, and Diffusely heterogenous appearance of the bones with decreased osseous mineralization. C/w WOC Care Recc's and continue with collagenase  1 application topically daily and interchanged with Thera honey if necessary  Pressure Injury, poA Wound 09/12/23 1700 Pressure Injury Sacrum Stage 2 -  Partial thickness loss of dermis presenting as a shallow open injury with a red, pink wound bed without slough. (Active)  -C/w Zinc  Oxide 1 application BIDprn   Generalized Anxiety Disorder and Depression: C/w Paroxetine  20 mg po daily, Mirtazepine 15 mg po at bedtime; Also on Quetiapine  25 mg po at bedtime    Cognitive Dysfunction and Delirium: Noted. History of severe delirium during prior hospitalization. Patient now with concerns for hallucinations. Sleep appeared to be compromised. Patient has intermittent confusion. Currently, he lives alone. Dr. Briana had a long discussion with son on 11/24 and discussed limitations if patient shows evidence of capacity as it relates to his desire/request to be discharged. He discussed that in similar situations, family generally needs to provide assistance to their loved one to improve safety concerns when patient has capacity  to make decisions we may disagree with. Son continues to reiterate that we would be discharging unsafely. He also discussed caregiver support,  however son states they cannot afford this. Mental status has improved with correction of sleep-wake cycle. Palliative care consulted per son's request. -Psychiatry recommendations: Seroquel  50 mg nightly -Delirium precautions  GOC/Disposition: After further goals of care discussion patient would like to pursue SNF rehabilitation however was denied by insurance Dr. Janit so family is going to appeal the denial; last PT note from 11/30 continues to recommend SNF -After discussion with TOC the patient's son is appealing the Denial again

## 2023-11-14 NOTE — Plan of Care (Signed)
  Problem: Education: Goal: Knowledge of General Education information will improve Description: Including pain rating scale, medication(s)/side effects and non-pharmacologic comfort measures Outcome: Progressing   Problem: Clinical Measurements: Goal: Respiratory complications will improve Outcome: Progressing   Problem: Pain Managment: Goal: General experience of comfort will improve and/or be controlled Outcome: Progressing   Problem: Activity: Goal: Risk for activity intolerance will decrease Outcome: Not Progressing

## 2023-11-14 NOTE — NC FL2 (Signed)
 [place]  MEDICAID FL2 LEVEL OF CARE FORM     IDENTIFICATION  Patient Name: [name] [name] Birthdate: October 28, 1955 Sex: male Admission Date (Current Location): 11/12/2023  [facility] and [place] Number:  Producer, Television/film/video and Address:  [facility], [address] [place], [place] [address]      Provider Number: [id number]  Attending Physician Name and Address:  Briana Elgin LABOR, MD  Relative Name and Phone Number:  AVIRAJ, KENTNER [phone]  [phone]    Current Level of Care: Hospital Recommended Level of Care: Skilled Nursing Facility Prior Approval Number:    Date Approved/Denied:   PASRR Number: [id number]  Discharge Plan: SNF    Current Diagnoses: Patient Active Problem List   Diagnosis Date Noted   Pleural effusion on right 11/14/2023   Occult blood in stools 11/13/2023   Anticoagulated 11/13/2023   Stage ii Pressure injury sacrum, present on admission 11/01/2023   Hypomagnesemia 10/30/2023   Possible UTI 10/30/2023   Subdural hematoma (HCC) 10/23/2023   AKI (acute kidney injury) 09/12/2023   Uremia 09/12/2023   Diabetes (HCC) 09/12/2023   Acute encephalopathy 09/12/2023   Urinary incontinence 08/30/2023   Hx of iron  deficiency anemia 08/22/2023   Sacral decubitus ulcer 08/18/2023   Bilateral pleural effusion 08/18/2023   GAD (generalized anxiety disorder) 08/18/2023   Chronic diastolic CHF (congestive heart failure) (HCC) 02/03/2023   History of CVA (cerebrovascular accident) 11/30/2022   Heart valve vegetation 11/23/2022   Noncompliance 11/20/2022   Requires continuous supervision for activities of daily living (ADL) 11/20/2022   Pre-ulcerative calluses 03/05/2022   Protein-calorie malnutrition, severe 01/06/2022   Atrial fibrillation with slow ventricular response (HCC) 01/03/2022   Visual hallucinations 01/03/2022   Hematuria 01/03/2022   Postherpetic neuralgia 08/11/2021   Blood clotting disorder 07/12/2021    Type 1 diabetes mellitus (HCC) 02/16/2021   Chronic diarrhea 02/16/2021   Sinus bradycardia 02/15/2021   Iron  deficiency anemia 07/14/2020   Diabetic ulcer of left foot associated with type 1 diabetes mellitus (HCC) 06/30/2020   Pressure injury of skin 05/24/2020   BPH with obstruction/lower urinary tract symptoms 03/16/2020   Normocytic anemia 01/13/2020   Hyperlipidemia LDL goal <70 01/13/2020   Peripheral artery disease 01/13/2020   Coronary artery disease involving bypass graft of transplanted heart without angina pectoris 01/13/2020   Paroxysmal atrial fibrillation (HCC) 01/13/2020   Recurrent major depressive disorder, in partial remission 01/13/2020   Major depressive disorder, recurrent episode, moderate (HCC) 12/09/2019   Tear of lateral meniscus of knee 10/05/2019   Aortic dissection (HCC) 02/05/2019   Hypertension 02/05/2019   Psoriasis 02/05/2019   Vitamin D  deficiency 01/07/2019   Microalbuminuria 01/07/2019   Patellar tendonitis of right knee 04/24/2018   Peyronie's disease 06/09/2015   Erectile dysfunction 03/15/2015   S/P aortic aneurysm repair 05/19/2012   S/P CABG (coronary artery bypass graft) 05/19/2012   Diabetic retinopathy (HCC) 02/27/2011   Contracture of hand joint 11/01/2010    Orientation RESPIRATION BLADDER Height & Weight     Self, Situation, Place  Normal Incontinent, Indwelling catheter Weight: 150 lb (68 kg) Height:  6' 3 (190.5 cm)  BEHAVIORAL SYMPTOMS/MOOD NEUROLOGICAL BOWEL NUTRITION STATUS      Incontinent Diet (see discharge summary)  AMBULATORY STATUS COMMUNICATION OF NEEDS Skin   Limited Assist Verbally Other (Comment) (redness)                       Personal Care Assistance Level of Assistance  Bathing,  Feeding, Dressing Bathing Assistance: Limited assistance Feeding assistance: Independent Dressing Assistance: Limited assistance     Functional Limitations Info  Sight, Hearing, Speech Sight Info: Adequate Hearing Info:  Adequate Speech Info: Adequate    SPECIAL CARE FACTORS FREQUENCY  PT (By licensed PT), OT (By licensed OT)     PT Frequency: 5x week OT Frequency: 5x week            Contractures Contractures Info: Not present    Additional Factors Info  Code Status, Allergies, Insulin  Sliding Scale Code Status Info: full Allergies Info: woof fabric   Insulin  Sliding Scale Info: Novolog : see discharge summary       Current Medications (11/14/2023):  This is the current hospital active medication list Current Facility-Administered Medications  Medication Dose Route Frequency Provider Last Rate Last Admin   acetaminophen  (TYLENOL ) tablet 500 mg  500 mg Oral Q6H PRN Samtani, Jai-Gurmukh, MD       cefTRIAXone  (ROCEPHIN ) 1 g in sodium chloride  0.9 % 100 mL IVPB  1 g Intravenous Q24H Samtani, Jai-Gurmukh, MD 200 mL/hr at 11/14/23 0157 1 g at 11/14/23 0157   Chlorhexidine  Gluconate Cloth 2 % PADS 6 each  6 each Topical Daily Briana Elgin LABOR, MD       insulin  aspart (novoLOG ) injection 0-6 Units  0-6 Units Subcutaneous TID WC Briana Elgin LABOR, MD   1 Units at 11/14/23 1142   mirtazapine  (REMERON ) tablet 15 mg  15 mg Oral QHS Samtani, Jai-Gurmukh, MD   15 mg at 11/13/23 2105   PARoxetine  (PAXIL ) tablet 20 mg  20 mg Oral Daily Samtani, Jai-Gurmukh, MD   20 mg at 11/14/23 9156   QUEtiapine  (SEROQUEL ) tablet 25 mg  25 mg Oral QHS Samtani, Jai-Gurmukh, MD   25 mg at 11/13/23 2105   zinc  oxide 20 % ointment 1 Application  1 Application Topical BID PRN Samtani, Jai-Gurmukh, MD         Discharge Medications: Please see discharge summary for a list of discharge medications.  Relevant Imaging Results:  Relevant Lab Results:   Additional Information SSN [id number]  [name], LCSW

## 2023-11-14 NOTE — TOC Initial Note (Addendum)
 Transition of Care [facility]) - Initial/Assessment Note    Patient Details  Name: [name] [name] MRN: [id number] Date of Birth: Jan 21, 1955  [facility]) CM/SW Contact:    [name], LCSW Phone Number: 11/14/2023, 2:39 PM  Clinical Narrative:          Pt with fluctuation orientation, x2-x3.  CSW informed pt son requesting to speak with CSW.  Son not in room, CSW spoke with pt briefly.   CSW spoke with son [name] [name].  Pt has lived alone, was admitted last month after falling at home and being down for significant period of time.  Home alone not a viable option anymore.  [name] [name] worked with CSWs during that admission to find STR to LTC, ended up at [facility].  He has been working with their business office on [facility] application, was supposed to meet with them this week.   [name] [name] did not hold the bed at [facility] as it would have cost $1300.  He is open to continuing this plan at [facility] to LTC but not married to it, would consider other options.  He is aware LTC beds can be hard to find.  APS is involved after the prior fall.  [name] with [facility] financial counseling was also involved with medicaid at last admission.      CSW spoke with Kristin/[facility].  They are discussing pt return tomorrow.  She confirmed son did not hold the bed, so would need new bed available.   1415: CSW spoke with pt more at length, discussed PT recommendation for STR.  Pt stating he will not go to SNF at [facility] or elsewhere. States he is going home.  Pt did not recall the recent fall leading to the October admission, stated he fell years ago.  States he is doing just fine living home alone.   Referral sent to [facility] and also to other SNFs.    Message from [name]/Financial counseling: pt was screened for medicaid last admit, over the income limit.   1530: Message from Kristin/[facility]: they are not going to make offer for pt to return there for STR or LTC.  They will  inform pt son.    Expected Discharge Plan: Skilled Nursing Facility Barriers to Discharge: Continued Medical Work up   Patient Goals and CMS Choice            Expected Discharge Plan and Services In-house Referral: Clinical Social Work   Post Acute Care Choice: Skilled Nursing Facility Living arrangements for the past 2 months: Single Family Home                                      Prior Living Arrangements/Services Living arrangements for the past 2 months: Single Family Home Lives with:: Self Patient language and need for interpreter reviewed:: Yes Do you feel safe going back to the place where you live?: Yes      Need for Family Participation in Patient Care: Yes (Comment) Care giver support system in place?: Yes (comment)   Criminal Activity/Legal Involvement Pertinent to Current Situation/Hospitalization: No - Comment as needed  Activities of Daily Living   ADL Screening (condition at time of admission) Independently performs ADLs?: No Does the patient have a NEW difficulty with bathing/dressing/toileting/self-feeding that is expected to last >3 days?: Yes (Initiates electronic notice to provider for possible OT consult) Does the patient have a NEW difficulty with getting in/out  of bed, walking, or climbing stairs that is expected to last >3 days?: Yes (Initiates electronic notice to provider for possible PT consult) Does the patient have a NEW difficulty with communication that is expected to last >3 days?: No Is the patient deaf or have difficulty hearing?: Yes Does the patient have difficulty seeing, even when wearing glasses/contacts?: No Does the patient have difficulty concentrating, remembering, or making decisions?: Yes  Permission Sought/Granted                  Emotional Assessment Appearance:: Appears stated age Attitude/Demeanor/Rapport: Engaged Affect (typically observed): Pleasant Orientation: : Oriented to Self, Oriented to Place,  Oriented to Situation      Admission diagnosis:  Occult blood in stools [R19.5] Gross hematuria [R31.0] Acute cystitis with hematuria [N30.01] AKI (acute kidney injury) [N17.9] Hematuria [R31.9] Patient Active Problem List   Diagnosis Date Noted   Pleural effusion on right 11/14/2023   Occult blood in stools 11/13/2023   Anticoagulated 11/13/2023   Stage ii Pressure injury sacrum, present on admission 11/01/2023   Hypomagnesemia 10/30/2023   Possible UTI 10/30/2023   Subdural hematoma (HCC) 10/23/2023   AKI (acute kidney injury) 09/12/2023   Uremia 09/12/2023   Diabetes (HCC) 09/12/2023   Acute encephalopathy 09/12/2023   Urinary incontinence 08/30/2023   Hx of iron  deficiency anemia 08/22/2023   Sacral decubitus ulcer 08/18/2023   Bilateral pleural effusion 08/18/2023   GAD (generalized anxiety disorder) 08/18/2023   Chronic diastolic CHF (congestive heart failure) (HCC) 02/03/2023   History of CVA (cerebrovascular accident) 11/30/2022   Heart valve vegetation 11/23/2022   Noncompliance 11/20/2022   Requires continuous supervision for activities of daily living (ADL) 11/20/2022   Pre-ulcerative calluses 03/05/2022   Protein-calorie malnutrition, severe 01/06/2022   Atrial fibrillation with slow ventricular response (HCC) 01/03/2022   Visual hallucinations 01/03/2022   Hematuria 01/03/2022   Postherpetic neuralgia 08/11/2021   Blood clotting disorder 07/12/2021   Type 1 diabetes mellitus (HCC) 02/16/2021   Chronic diarrhea 02/16/2021   Sinus bradycardia 02/15/2021   Iron  deficiency anemia 07/14/2020   Diabetic ulcer of left foot associated with type 1 diabetes mellitus (HCC) 06/30/2020   Pressure injury of skin 05/24/2020   BPH with obstruction/lower urinary tract symptoms 03/16/2020   Normocytic anemia 01/13/2020   Hyperlipidemia LDL goal <70 01/13/2020   Peripheral artery disease 01/13/2020   Coronary artery disease involving bypass graft of transplanted heart  without angina pectoris 01/13/2020   Paroxysmal atrial fibrillation (HCC) 01/13/2020   Recurrent major depressive disorder, in partial remission 01/13/2020   Major depressive disorder, recurrent episode, moderate (HCC) 12/09/2019   Tear of lateral meniscus of knee 10/05/2019   Aortic dissection (HCC) 02/05/2019   Hypertension 02/05/2019   Psoriasis 02/05/2019   Vitamin D  deficiency 01/07/2019   Microalbuminuria 01/07/2019   Patellar tendonitis of right knee 04/24/2018   Peyronie's disease 06/09/2015   Erectile dysfunction 03/15/2015   S/P aortic aneurysm repair 05/19/2012   S/P CABG (coronary artery bypass graft) 05/19/2012   Diabetic retinopathy (HCC) 02/27/2011   Contracture of hand joint 11/01/2010   PCP:  [name], Mardy HERO, PA-C Pharmacy:  No Pharmacies Listed    Social Drivers of Health (SDOH) Social History: SDOH Screenings   Food Insecurity: No Food Insecurity (11/13/2023)  Recent Concern: Food Insecurity - Food Insecurity Present (08/18/2023)  Housing: Low Risk  (11/13/2023)  Transportation Needs: No Transportation Needs (11/13/2023)  Utilities: Not At Risk (11/13/2023)  Alcohol Screen: Low Risk  (08/19/2023)  Depression (PHQ2-9): Medium Risk (  07/26/2023)  Financial Resource Strain: Low Risk  (08/19/2023)  Physical Activity: Inactive (11/21/2022)  Social Connections: Socially Isolated (11/13/2023)  Stress: Stress Concern Present (11/21/2022)  Tobacco Use: High Risk (11/12/2023)  Health Literacy: Adequate Health Literacy (11/21/2022)   SDOH Interventions:     Readmission Risk Interventions    08/20/2023    4:20 PM  Readmission Risk Prevention Plan  Transportation Screening Complete  PCP or Specialist Appt within 3-5 Days Complete  HRI or Home Care Consult Complete  Palliative Care Screening Not Applicable  Medication Review (RN Care Manager) Complete

## 2023-11-14 NOTE — Progress Notes (Signed)
 Progress Note   Subjective  Patient has ongoing hematuria, drift in Hgb. No blood per rectum. He feels at baseline   Objective   Vital signs in last 24 hours: Temp:  [97.6 F (36.4 C)-98.3 F (36.8 C)] 98.1 F (36.7 C) (11/13 1130) Pulse Rate:  [50-64] 54 (11/13 1130) Resp:  [16-20] 20 (11/13 1130) BP: (127-140)/(58-69) 140/62 (11/13 1130) SpO2:  [95 %-100 %] 99 % (11/13 1130) Last BM Date : 11/12/23 General:    white male in NAD Neurologic:  Alert and oriented,  grossly normal neurologically. Psych:  Cooperative. Normal mood and affect.  Intake/Output from previous day: 11/12 0701 - 11/13 0700 In: 1609.3 [I.V.:1509.3; IV Piggyback:100] Out: 1850 [Urine:1850] Intake/Output this shift: Total I/O In: 396 [Blood:396] Out: 1000 [Urine:1000]  Lab Results: Recent Labs    11/12/23 2202 11/12/23 2243 11/14/23 0512  WBC 6.4  --  4.7  HGB 7.5* 7.8* 6.7*  HCT 24.8* 23.0* 21.5*  PLT 244  --  200   BMET Recent Labs    11/12/23 2202 11/12/23 2243 11/14/23 0512  NA 136 137 138  K 4.9 5.0 4.3  CL 100 100 107  CO2 27  --  24  GLUCOSE 235* 224* 208*  BUN 94* 93* 52*  CREATININE 1.48* 1.50* 0.79  CALCIUM  8.4*  --  8.0*   LFT Recent Labs    11/14/23 0512  PROT 5.9*  ALBUMIN 1.9*  AST 17  ALT 26  ALKPHOS 132*  BILITOT 0.4   PT/INR Recent Labs    11/12/23 2202  LABPROT 20.0*  INR 1.6*    Studies/Results: CT ABDOMEN PELVIS WO CONTRAST Result Date: 11/13/2023 CLINICAL DATA:  Abdominal/flank pain.  Possible renal stone. EXAM: CT ABDOMEN AND PELVIS WITHOUT CONTRAST TECHNIQUE: Multidetector CT imaging of the abdomen and pelvis was performed following the standard protocol without IV contrast. RADIATION DOSE REDUCTION: This exam was performed according to the departmental dose-optimization program which includes automated exposure control, adjustment of the mA and/or kV according to patient size and/or use of iterative reconstruction technique. COMPARISON:   CT abdomen/pelvis 11/24/2022, CT chest 08/18/2023 FINDINGS: Lower chest: Median sternotomy wires are present. Heart is normal size. Calcified plaque over the descending thoracic aorta. The lung bases demonstrate a moderate size right pleural fluid collection as seen previously as there are now air-fluid levels within this pleural fluid collection suggesting possible infected fluid collection/empyema versus recent instrumentation. Stable associated compressive atelectasis over the right lower lobe. Curvilinear atelectasis over the right middle lobe. Small left pleural effusion with associated left basilar atelectasis. Mild bronchiectatic change over the left base. Hepatobiliary: Liver and biliary tree are unremarkable. Gallbladder is somewhat contracted with fluid level suggesting sludge versus noncalcified stones. Fluid adjacent the gallbladder. Pancreas: Mild fatty atrophy is pancreas is otherwise unremarkable. Spleen: Normal. Adrenals/Urinary Tract: Adrenal glands unchanged. Kidneys are normal in size without hydronephrosis. Punctate nonobstructing stone versus vascular calcification projecting over the mid to upper pole left kidney. No focal renal mass. Ureters are normal. Foley catheter is present within the bladder with air over the nondependent portion of the bladder. Stomach/Bowel: Stomach and small bowel are normal. Appendix is normal. Moderate fecal retention over the rectum as the colon is otherwise unremarkable. Vascular/Lymphatic: Calcified plaque over the abdominal aorta with stable 3 cm aneurysm of the distal abdominal aorta. Stable aneurysmal dilatation of the common iliac arteries with the right measuring 2.6 cm and left measuring 2.2 cm. No adenopathy. Reproductive: Prostate is unremarkable. Penile prosthesis with  reservoir unchanged. Other: Minimal scattered free peritoneal fluid is present. Musculoskeletal: No focal abnormality. Stable symmetric heterogeneous sclerosis of the femoral heads  IMPRESSION: 1. Moderate size right pleural fluid collection with new air-fluid levels suggesting possible infected fluid collection/empyema versus recent instrumentation. Stable associated compressive atelectasis over the right lower lobe. Small left pleural effusion with associated left basilar atelectasis. 2. Gallbladder is somewhat contracted with fluid level suggesting sludge versus noncalcified stones. Fluid adjacent the gallbladder. Recommend clinical correlation and possible ultrasound to exclude acute cholecystitis. 3. Minimal scattered free peritoneal fluid. 4. Stable 3 cm aneurysm of the distal abdominal aorta. Stable aneurysmal dilatation of the common iliac arteries with the right measuring 2.6 cm and left measuring 2.2 cm. Recommend follow-up ultrasound every 3 years. 5. Aortic atherosclerosis. Aortic Atherosclerosis (ICD10-I70.0). Electronically Signed   By: Toribio Agreste M.D.   On: 11/13/2023 10:46       Assessment / Plan:    68 y/o male here with the following:  History of IDA Heme positive stools Chronic diarrhea Anticoagulated (Eliquis ) Hematuria / UTI R pleural effusion  See prior notes for full details. History of IDA and heme positive stools, we have been trying to coordinate EGD and colonoscopy as outpatient for months. He has cancelled numerous times due to other health ailments and has not been able to get it done.  Now with hematuria / UTI, worsening Hgb which could be due to hematuria, no overt blood in the stools. Eliquis  is held. While he is hospitalized I offered / recommended he do EGD and colonoscopy as outpatient. I have discussed with him at length today that he has not been able to keep outpatient appointments and it is not realistic to think that he will likely be able to do this as outpatient (which needs to be done in the hospital setting due to his comorbidities). I relayed ddx for IDA to include colon or gastric cancer, etc, strongly recommend he pursue these  tests in the setting of ongoing need for anticoagulation and worsening anemia. He understands this but states he has too much going on and he declines the procedures. He wishes to pursue this as outpatient, again understanding that he has not been able to do this for months, but declines the exams as inpatient at this time. He understands delaying the exams could lead to the delay of a diagnosis of malignancy and lead to recurrent anemia / hospitalization, blood transfusions, etc.   We will contact him once outpatient to see how he is doing and if he wants to try as outpatient, again I think likelihood of him actually doing this given his cancellation rate to date, is quite low.  Recommend RBC transfusion for his worsening Hgb today.  Standing by to assist if he changes his mind, otherwise signing off for now.  Marcey Naval, MD [facility]

## 2023-11-14 NOTE — Consult Note (Signed)
 NAME:  [name] [name], MRN:  [id number], DOB:  September 11, 1955, LOS: 1 ADMISSION DATE:  11/12/2023, CONSULTATION DATE: 11/14/23  REFERRING MD: Briana CHIEF COMPLAINT: Right Pleural Effusion   History of Present Illness:  Pt is a 68 yr male significant hx of afib RVR on eliquis , HTN, CAD, CABGx4, aortic aneursym s/p repair, CVA, DM2, heart failure with preserved EF, and HLD who presented to ED with hematuria, AKI, and anemia. [facility] notified for admission and urology consulted in setting of hematuria. CT abdomen/pelvis obtained showing significant right pleural collection with air-fluid levels concern for infection/empyema. Patient afebrile and showing no signs of elevated WBC at this time. PCCM consulted for possible thoracentesis vs chest tube placement in setting of empyema   Pertinent  Medical History   Past Medical History:  Diagnosis Date   Acute urinary retention 01/04/2022   Altered mental status 08/18/2023   Anxiety    Arthritis    Cataract    CHF (congestive heart failure) (HCC)    Constipation    pt states goes EOD- hard stools    Coronary artery disease    Depression    Diabetes mellitus without complication (HCC)    Diabetic retinopathy (HCC)    DKA (diabetic ketoacidoses)    Hyperlipidemia    Hypertension    off meds   Hypertensive retinopathy    Lesion of ulnar nerve, left upper limb 07/12/2017   Non-intractable vomiting without nausea 06/10/2018   Postherpetic neuralgia 08/11/2021   Speculative diagnosis   Psoriasis    Tubular adenoma of colon 03/2019   UTI (urinary tract infection) 01/05/2022     Significant Hospital Events: Including procedures, antibiotic start and stop dates in addition to other pertinent events   11/13 Pulm Consult- for empyema on CT abdomen/pelvis   Interim History / Subjective:  Patient sitting up in chair No distress    Objective    Blood pressure (!) 140/62, pulse (!) 54, temperature 98.1 F (36.7 C), temperature source Oral,  resp. rate 20, height 6' 3 (1.905 m), weight 68 kg, SpO2 99%.        Intake/Output Summary (Last 24 hours) at 11/14/2023 1218 Last data filed at 11/14/2023 1129 Gross per 24 hour  Intake 2005.28 ml  Output 2850 ml  Net -844.72 ml   Filed Weights   11/12/23 2154  Weight: 68 kg    Examination: General: acute on chronic older adult male, sitting up in chair, in NAD HENT: normocephalic, PERRLA intact, missing teeth, pink MM Lungs: clear, right lung fields diminished, on RA, no distress  Cardiovascular: s1,s2, RRR, no JVD Abdomen: bs active Extremities: moves all extremities to command Neuro: alert, slightly forgetful and confused GU: hematuria, foley cath       Resolved problem list   Assessment and Plan  Empyema  Loculated pleural effusion right side  Hx of right thoracentesis in setting of CHF 2/25  CT abdomen/pelvis obtained showing significant right pleural collection with air-fluid levels concern for infection/empyema. P:  Recommend placing chest tube, and will send cultures, labs and cytology.  Patient hesitant in having procedure completed today, would like to discuss with son. Called son, [name]- in regards to chest tube placement to assist in treatment of empyema- son in agreement and will discuss with father this evening  - Will tentatively plan to place chest tube tomorrow at beside  Continue broad spectrum Abx- rocephin , will add flagyl for additional coverage   Other Problem List managed by Primary Gross Hematuria  Possible UTI  Anemia IDA AKI PAF CAD DM Aortic atherosclerosis  Chronic HFpEF PVD Left Foot Wound GAD Depression  Cognitive Dysfunction     Labs   CBC: Recent Labs  Lab 11/12/23 2202 11/12/23 2243 11/14/23 0512  WBC 6.4  --  4.7  NEUTROABS 4.9  --   --   HGB 7.5* 7.8* 6.7*  HCT 24.8* 23.0* 21.5*  MCV 89.2  --  87.0  PLT 244  --  200    Basic Metabolic Panel: Recent Labs  Lab 11/12/23 2202 11/12/23 2243 11/14/23 0512   NA 136 137 138  K 4.9 5.0 4.3  CL 100 100 107  CO2 27  --  24  GLUCOSE 235* 224* 208*  BUN 94* 93* 52*  CREATININE 1.48* 1.50* 0.79  CALCIUM  8.4*  --  8.0*   GFR: Estimated Creatinine Clearance: 82.6 mL/min (by C-G formula based on SCr of 0.79 mg/dL). Recent Labs  Lab 11/12/23 2202 11/14/23 0512  WBC 6.4 4.7    Liver Function Tests: Recent Labs  Lab 11/12/23 2202 11/14/23 0512  AST 27 17  ALT 32 26  ALKPHOS 177* 132*  BILITOT <0.2 0.4  PROT 6.7 5.9*  ALBUMIN 2.2* 1.9*   No results for input(s): LIPASE, AMYLASE in the last 168 hours. No results for input(s): AMMONIA in the last 168 hours.  ABG    Component Value Date/Time   HCO3 23.5 10/23/2023 1011   TCO2 27 11/12/2023 2243   ACIDBASEDEF 1.0 10/23/2023 1011   O2SAT 94 10/23/2023 1011     Coagulation Profile: Recent Labs  Lab 11/12/23 2202  INR 1.6*    Cardiac Enzymes: No results for input(s): CKTOTAL, CKMB, CKMBINDEX, TROPONINI in the last 168 hours.  HbA1C: HbA1c POC (<> result, manual entry)  Date/Time Value Ref Range Status  10/11/2020 11:34 AM >15 4.0 - 5.6 % Final    Comment:    Too High   Hgb A1c MFr Bld  Date/Time Value Ref Range Status  07/26/2023 09:19 AM 9.1 (H) 4.6 - 6.5 % Final    Comment:    Glycemic Control Guidelines for People with Diabetes:Non Diabetic:  <6%Goal of Therapy: <7%Additional Action Suggested:  >8%   02/03/2023 02:55 AM 7.8 (H) 4.8 - 5.6 % Final    Comment:    (NOTE) Pre diabetes:          5.7%-6.4%  Diabetes:              >6.4%  Glycemic control for   <7.0% adults with diabetes     CBG: Recent Labs  Lab 11/14/23 1138  GLUCAP 174*    Review of Systems:   Review of Systems  Constitutional: Negative.   HENT: Negative.    Eyes: Negative.   Respiratory: Negative.    Cardiovascular: Negative.   Gastrointestinal: Negative.   Genitourinary:  Positive for hematuria.  Musculoskeletal: Negative.   Skin: Negative.   Neurological: Negative.    Endo/Heme/Allergies: Negative.   Psychiatric/Behavioral: Negative.       Past Medical History:  He,  has a past medical history of Acute urinary retention (01/04/2022), Altered mental status (08/18/2023), Anxiety, Arthritis, Cataract, CHF (congestive heart failure) (HCC), Constipation, Coronary artery disease, Depression, Diabetes mellitus without complication (HCC), Diabetic retinopathy (HCC), DKA (diabetic ketoacidoses), Hyperlipidemia, Hypertension, Hypertensive retinopathy, Lesion of ulnar nerve, left upper limb (07/12/2017), Non-intractable vomiting without nausea (06/10/2018), Postherpetic neuralgia (08/11/2021), Psoriasis, Tubular adenoma of colon (03/2019), and UTI (urinary tract infection) (01/05/2022).   Surgical History:   Past Surgical History:  Procedure Laterality Date   ABDOMINAL AORTOGRAM W/LOWER EXTREMITY N/A 08/30/2020   Procedure: ABDOMINAL AORTOGRAM W/LOWER EXTREMITY;  Surgeon: Serene Gaile ORN, MD;  Location: [facility];  Service: Cardiovascular;  Laterality: N/A;   ABDOMINAL AORTOGRAM W/LOWER EXTREMITY N/A 11/15/2020   Procedure: ABDOMINAL AORTOGRAM W/LOWER EXTREMITY;  Surgeon: Serene Gaile ORN, MD;  Location: [facility];  Service: Cardiovascular;  Laterality: N/A;   ANKLE SURGERY     right   CARDIAC CATHETERIZATION  09/25/2001   COLONOSCOPY     ~10 yr ago- normal  per pt    CORONARY ARTERY BYPASS GRAFT  09/30/2001   CABG x 4   IR THORACENTESIS ASP PLEURAL SPACE W/IMG GUIDE  05/19/2020   IR THORACENTESIS ASP PLEURAL SPACE W/IMG GUIDE  02/04/2023   LAPAROSCOPIC INGUINAL HERNIA REPAIR Bilateral 02/09/2010   MASS EXCISION Left 03/12/2013   Procedure: LEFT INDEX EXCISION MASS AND DEBRIDMENT DISTAL INTERPHALANGEAL JOINT;  Surgeon: Franky JONELLE Curia, MD;  Location: [facility];  Service: Orthopedics;  Laterality: Left;   PERIPHERAL VASCULAR BALLOON ANGIOPLASTY Left 08/30/2020   Procedure: PERIPHERAL VASCULAR BALLOON ANGIOPLASTY;  Surgeon: Serene Gaile ORN, MD;  Location: [facility];  Service: Cardiovascular;  Laterality: Left;  Peroneal   TRANSESOPHAGEAL ECHOCARDIOGRAM (CATH LAB) N/A 11/26/2022   Procedure: TRANSESOPHAGEAL ECHOCARDIOGRAM;  Surgeon: Kate Lonni CROME, MD;  Location: [facility]VAS[facility];  Service: Cardiovascular;  Laterality: N/A;     Social History:   reports that he has been smoking cigars. He has never used smokeless tobacco. He reports that he does not currently use alcohol. He reports that he does not use drugs.   Family History:  His family history includes Cancer in his paternal grandmother; Colon cancer in his paternal grandmother; Lymphoma in his father. There is no history of Colon polyps, Esophageal cancer, Rectal cancer, Stomach cancer, Diabetes Mellitus I, Pancreatic cancer, Stroke, or Dementia.   Allergies Allergies  Allergen Reactions   Other Hives and Itching    Wool fabric - hives, itching     Home Medications  Prior to Admission medications   Medication Sig Start Date End Date Taking? Authorizing Provider  acetaminophen  (TYLENOL ) 500 MG tablet Take 500 mg by mouth every 6 (six) hours as needed for mild pain (pain score 1-3) or moderate pain (pain score 4-6).   Yes [provider]  mirtazapine  (REMERON ) 15 MG tablet TAKE 1 TABLET BY MOUTH EVERYDAY AT BEDTIME 04/23/23  Yes [name], [name], PA-C  PARoxetine  (PAXIL ) 20 MG tablet Take 20 mg by mouth daily.   Yes [provider]  QUEtiapine  (SEROQUEL ) 25 MG tablet Take 25 mg by mouth at bedtime. 09/16/23  Yes [provider]  zinc  oxide 20 % ointment Apply 1 Application topically in the morning, at noon, and at bedtime.   Yes [provider]     Total Time: 45 mins    Christian Niklaus Mamaril AGACNP-BC   [facility] 11/14/2023, 3:35 PM  Please see [email or web address] for pager details.  From 7A-7P if no response, please call [phone]. After hours, please call ELink [phone].

## 2023-11-14 NOTE — Progress Notes (Addendum)
 PROGRESS NOTE    [name] [name]  FMW:[id number] DOB: 1955-12-07 DOA: 11/12/2023 PCP: [name] HERO, PA-C   Brief Narrative: [name] [name] is a 68 y.o. male with a history of atrial fibrillation with slow ventricular response, CAD, CABG x 4, aortic aneurysm s/p repair, CVA, diabetes mellitus type 2, heart failure with preserved EF.  Patient presented secondary to abnormal metabolic panel and while in the emergency department found to have evidence of gross hematuria, anemia and AKI.  Urology consulted.  Patient started IV fluids.  During workup, patient was Hemoccult positive, GI consulted.  Hemoglobin continued to drift down requiring 1 unit PRBC via transfusion.   Assessment and Plan:  Gross hematuria Possibly related to foley catheter trauma and complicated by Eliquis  use. Urology consulted. CT abdomen/pelvis significant for   Possible UTI Urine culture obtained. Patient started empirically on Ceftriaxone . Urinalysis with rare bacteruria, pyuria and is nitrite positive; significant squamous cells. -Continue Ceftriaxone  IV  Acute blood loss anemia Chronic iron  deficiency anemia Baseline hemoglobin appears to be around 8-9.  Hemoglobin of 7.5 on admission with downtrend to 6.7 likely related to patient's gross hematuria in addition to heme positive stool.  1 unit of PRBC ordered on 11/13. -Posttransfusion H&H -CBC in a.m.  AKI Present on admission with creatinine of 1.48 and peak of 1.50. Resolved.  Heme positive stool No history of hematochezia or melena.  BUN significantly elevated on admission, but patient also has evidence of dehydration.  Complicated by Eliquis  use. GI consulted and have recommended inpatient EGD/Colonoscopy. Patient is considering this plan.   Paroxysmal atrial fibrillation with slow ventricular response Patient is not on rate control medication secondary to slow ventricular response.  He is managed on Eliquis  secondary to a CHA2DS2-VASc of  4.  Eliquis  held secondary to hematuria and associated acute anemia.  Right pleural effusion Noted on CT imaging with evidence of moderate-sized collection.  Concern for possible empyema secondary to gas seen on imaging.  Last thoracentesis was from February 2025, which was also right-sided and was secondary to heart failure.  Patient is completely asymptomatic.  Unsure if this would require thoracentesis versus consideration of a chest tube. -Pulmonology consultation for recommendations  Diabetes mellitus type 2 Uncontrolled with hyperglycemia based on last hemoglobin A1c of 9.1% from July 2025. -SSI -Hemoglobin A1C  CAD S/p CABG x4.  Aortic atherosclerosis Noted on CT imaging.  Aortic aneurysm Stable.  Chronic HFpEF Currently stable.  Peripheral vascular disease Noted. Not on medication therapy.  Ambulatory dysfunction Noted. -PT/OT evaluation  Left foot wound Noted.  Generalized anxiety disorder Depression -Continue Paxil  and Remeron   Cognitive dysfunction Noted. -Continue Seroquel    DVT prophylaxis: SCDs Code Status:   Code Status: Full Code Family Communication: Son on telephone Disposition Plan: Discharge pending ongoing specialist recommendations, stable hemoglobin   Consultants:  Gastroenterology Urology  Procedures:  None  Antimicrobials: Ceftriaxone     Subjective: Patient reports no concerns this morning. No cough/production, no hematochezia or melena noted. He also reports not remembering talking with GI about his concern for GI bleeding.  Objective: BP 127/64 (BP Location: Left Arm)   Pulse (!) 55   Temp 97.8 F (36.6 C)   Resp 16   Ht 6' 3 (1.905 m)   Wt 68 kg   SpO2 98%   BMI 18.75 kg/m   Examination:  General exam: Appears calm and comfortable. Respiratory system: Clear to auscultation no left but diminished on right. Respiratory effort normal. Cardiovascular system: S1 & S2 heard,  RRR. No murmur. Gastrointestinal system:  Abdomen is nondistended, soft and nontender. Normal bowel sounds heard. Central nervous system: Alert and oriented to person, place and time. Musculoskeletal: No edema. No calf tenderness Psychiatry: Short term memory appears to be impaired.   Data Reviewed: I have personally reviewed following labs and imaging studies  CBC Lab Results  Component Value Date   WBC 4.7 11/14/2023   RBC 2.47 (L) 11/14/2023   HGB 6.7 (LL) 11/14/2023   HCT 21.5 (L) 11/14/2023   MCV 87.0 11/14/2023   MCH 27.1 11/14/2023   PLT 200 11/14/2023   MCHC 31.2 11/14/2023   RDW 16.8 (H) 11/14/2023   LYMPHSABS 0.6 (L) 11/12/2023   MONOABS 0.7 11/12/2023   EOSABS 0.2 11/12/2023   BASOSABS 0.0 11/12/2023     Last metabolic panel Lab Results  Component Value Date   NA 138 11/14/2023   K 4.3 11/14/2023   CL 107 11/14/2023   CO2 24 11/14/2023   BUN 52 (H) 11/14/2023   CREATININE 0.79 11/14/2023   GLUCOSE 208 (H) 11/14/2023   GFRNONAA >60 11/14/2023   GFRAA >60 06/09/2018   CALCIUM  8.0 (L) 11/14/2023   PHOS 3.9 02/07/2023   PROT 5.9 (L) 11/14/2023   ALBUMIN 1.9 (L) 11/14/2023   LABGLOB 3.8 08/26/2023   BILITOT 0.4 11/14/2023   ALKPHOS 132 (H) 11/14/2023   AST 17 11/14/2023   ALT 26 11/14/2023   ANIONGAP 7 11/14/2023    GFR: Estimated Creatinine Clearance: 82.6 mL/min (by C-G formula based on SCr of 0.79 mg/dL).  Recent Results (from the past 240 hours)  MRSA Next Gen by PCR, Nasal     Status: None   Collection Time: 11/13/23  4:02 PM   Specimen: Nasal Mucosa; Nasal Swab  Result Value Ref Range Status   MRSA by PCR Next Gen NOT DETECTED NOT DETECTED Final    Comment: (NOTE) The GeneXpert MRSA Assay (FDA approved for NASAL specimens only), is one component of a comprehensive MRSA colonization surveillance program. It is not intended to diagnose MRSA infection nor to guide or monitor treatment for MRSA infections. Test performance is not FDA approved in patients less than 70  years old. Performed at [facility], [address], [place], [place] [address]       Radiology Studies: CT ABDOMEN PELVIS WO CONTRAST Result Date: 11/13/2023 CLINICAL DATA:  Abdominal/flank pain.  Possible renal stone. EXAM: CT ABDOMEN AND PELVIS WITHOUT CONTRAST TECHNIQUE: Multidetector CT imaging of the abdomen and pelvis was performed following the standard protocol without IV contrast. RADIATION DOSE REDUCTION: This exam was performed according to the departmental dose-optimization program which includes automated exposure control, adjustment of the mA and/or kV according to patient size and/or use of iterative reconstruction technique. COMPARISON:  CT abdomen/pelvis 11/24/2022, CT chest 08/18/2023 FINDINGS: Lower chest: Median sternotomy wires are present. Heart is normal size. Calcified plaque over the descending thoracic aorta. The lung bases demonstrate a moderate size right pleural fluid collection as seen previously as there are now air-fluid levels within this pleural fluid collection suggesting possible infected fluid collection/empyema versus recent instrumentation. Stable associated compressive atelectasis over the right lower lobe. Curvilinear atelectasis over the right middle lobe. Small left pleural effusion with associated left basilar atelectasis. Mild bronchiectatic change over the left base. Hepatobiliary: Liver and biliary tree are unremarkable. Gallbladder is somewhat contracted with fluid level suggesting sludge versus noncalcified stones. Fluid adjacent the gallbladder. Pancreas: Mild fatty atrophy is pancreas is otherwise unremarkable. Spleen: Normal. Adrenals/Urinary Tract: Adrenal  glands unchanged. Kidneys are normal in size without hydronephrosis. Punctate nonobstructing stone versus vascular calcification projecting over the mid to upper pole left kidney. No focal renal mass. Ureters are normal. Foley catheter is present within the bladder with air over the  nondependent portion of the bladder. Stomach/Bowel: Stomach and small bowel are normal. Appendix is normal. Moderate fecal retention over the rectum as the colon is otherwise unremarkable. Vascular/Lymphatic: Calcified plaque over the abdominal aorta with stable 3 cm aneurysm of the distal abdominal aorta. Stable aneurysmal dilatation of the common iliac arteries with the right measuring 2.6 cm and left measuring 2.2 cm. No adenopathy. Reproductive: Prostate is unremarkable. Penile prosthesis with reservoir unchanged. Other: Minimal scattered free peritoneal fluid is present. Musculoskeletal: No focal abnormality. Stable symmetric heterogeneous sclerosis of the femoral heads IMPRESSION: 1. Moderate size right pleural fluid collection with new air-fluid levels suggesting possible infected fluid collection/empyema versus recent instrumentation. Stable associated compressive atelectasis over the right lower lobe. Small left pleural effusion with associated left basilar atelectasis. 2. Gallbladder is somewhat contracted with fluid level suggesting sludge versus noncalcified stones. Fluid adjacent the gallbladder. Recommend clinical correlation and possible ultrasound to exclude acute cholecystitis. 3. Minimal scattered free peritoneal fluid. 4. Stable 3 cm aneurysm of the distal abdominal aorta. Stable aneurysmal dilatation of the common iliac arteries with the right measuring 2.6 cm and left measuring 2.2 cm. Recommend follow-up ultrasound every 3 years. 5. Aortic atherosclerosis. Aortic Atherosclerosis (ICD10-I70.0). Electronically Signed   By: Toribio Agreste M.D.   On: 11/13/2023 10:46      LOS: 1 day    Elgin Lam, MD [facility] 11/14/2023, 7:17 AM   If 7PM-7AM, please contact night-coverage [email or web address]

## 2023-11-14 NOTE — Progress Notes (Signed)
 PT Cancellation Note  Patient Details Name: [name] [name] MRN: [id number] DOB: 22-Dec-1955   Cancelled Treatment:    Reason Eval/Treat Not Completed: Patient not medically ready (PT consult appreciated and chart reviewed. Pt's Hgb is 6.7, which is a 1.1 drop from yesterday. Plans for 1 unit PRBCs. Will follow-up for PT evaluation after blood transfusion and as schedule permits.)  [name] [name], PT, DPT Acute Rehabilitation Services Office: [phone] Secure Chat Preferred  [name] [name] 11/14/2023, 7:18 AM

## 2023-11-14 NOTE — Progress Notes (Signed)
 Subjective: Improved but ongoing bleeding. Refuses hematuria catheter.    Objective: Vital signs in last 24 hours: Temp:  [97.6 F (36.4 C)-98.3 F (36.8 C)] 98.1 F (36.7 C) (11/13 1130) Pulse Rate:  [50-64] 54 (11/13 1130) Resp:  [16-20] 20 (11/13 1130) BP: (127-140)/(58-69) 140/62 (11/13 1130) SpO2:  [95 %-100 %] 99 % (11/13 1130)  Assessment/Plan: # Gross hematuria #AKI #hemorrhagic cystis  Possible hemorrhagic cystitis, improvement overnight with ABX Trend H&H. Transfusion 1u PRBC today Pt again refuses hematuria catheter. Hand irrigate Q2. Pt agrees to exchange foley tomorrow if indicated.  Urology will follow  Intake/Output from previous day: 11/12 0701 - 11/13 0700 In: 1609.3 [I.V.:1509.3; IV Piggyback:100] Out: 1850 [Urine:1850]  Intake/Output this shift: Total I/O In: 396 [Blood:396] Out: 1000 [Urine:1000]  Physical Exam:  General: Alert and mildly confused CV: No cyanosis Lungs: equal chest rise Abdomen: Soft, NTND, no rebound or guarding Gu: clear med red urine in tubing without clot  Lab Results: Recent Labs    11/12/23 2202 11/12/23 2243 11/14/23 0512  HGB 7.5* 7.8* 6.7*  HCT 24.8* 23.0* 21.5*   BMET Recent Labs    11/12/23 2202 11/12/23 2243 11/14/23 0512  NA 136 137 138  K 4.9 5.0 4.3  CL 100 100 107  CO2 27  --  24  GLUCOSE 235* 224* 208*  BUN 94* 93* 52*  CREATININE 1.48* 1.50* 0.79  CALCIUM  8.4*  --  8.0*  HGB 7.5* 7.8* 6.7*  WBC 6.4  --  4.7     Studies/Results: CT ABDOMEN PELVIS WO CONTRAST Result Date: 11/13/2023 CLINICAL DATA:  Abdominal/flank pain.  Possible renal stone. EXAM: CT ABDOMEN AND PELVIS WITHOUT CONTRAST TECHNIQUE: Multidetector CT imaging of the abdomen and pelvis was performed following the standard protocol without IV contrast. RADIATION DOSE REDUCTION: This exam was performed according to the departmental dose-optimization program which includes automated exposure control, adjustment of the mA and/or  kV according to patient size and/or use of iterative reconstruction technique. COMPARISON:  CT abdomen/pelvis 11/24/2022, CT chest 08/18/2023 FINDINGS: Lower chest: Median sternotomy wires are present. Heart is normal size. Calcified plaque over the descending thoracic aorta. The lung bases demonstrate a moderate size right pleural fluid collection as seen previously as there are now air-fluid levels within this pleural fluid collection suggesting possible infected fluid collection/empyema versus recent instrumentation. Stable associated compressive atelectasis over the right lower lobe. Curvilinear atelectasis over the right middle lobe. Small left pleural effusion with associated left basilar atelectasis. Mild bronchiectatic change over the left base. Hepatobiliary: Liver and biliary tree are unremarkable. Gallbladder is somewhat contracted with fluid level suggesting sludge versus noncalcified stones. Fluid adjacent the gallbladder. Pancreas: Mild fatty atrophy is pancreas is otherwise unremarkable. Spleen: Normal. Adrenals/Urinary Tract: Adrenal glands unchanged. Kidneys are normal in size without hydronephrosis. Punctate nonobstructing stone versus vascular calcification projecting over the mid to upper pole left kidney. No focal renal mass. Ureters are normal. Foley catheter is present within the bladder with air over the nondependent portion of the bladder. Stomach/Bowel: Stomach and small bowel are normal. Appendix is normal. Moderate fecal retention over the rectum as the colon is otherwise unremarkable. Vascular/Lymphatic: Calcified plaque over the abdominal aorta with stable 3 cm aneurysm of the distal abdominal aorta. Stable aneurysmal dilatation of the common iliac arteries with the right measuring 2.6 cm and left measuring 2.2 cm. No adenopathy. Reproductive: Prostate is unremarkable. Penile prosthesis with reservoir unchanged. Other: Minimal scattered free peritoneal fluid is present. Musculoskeletal:  No focal  abnormality. Stable symmetric heterogeneous sclerosis of the femoral heads IMPRESSION: 1. Moderate size right pleural fluid collection with new air-fluid levels suggesting possible infected fluid collection/empyema versus recent instrumentation. Stable associated compressive atelectasis over the right lower lobe. Small left pleural effusion with associated left basilar atelectasis. 2. Gallbladder is somewhat contracted with fluid level suggesting sludge versus noncalcified stones. Fluid adjacent the gallbladder. Recommend clinical correlation and possible ultrasound to exclude acute cholecystitis. 3. Minimal scattered free peritoneal fluid. 4. Stable 3 cm aneurysm of the distal abdominal aorta. Stable aneurysmal dilatation of the common iliac arteries with the right measuring 2.6 cm and left measuring 2.2 cm. Recommend follow-up ultrasound every 3 years. 5. Aortic atherosclerosis. Aortic Atherosclerosis (ICD10-I70.0). Electronically Signed   By: Toribio Agreste M.D.   On: 11/13/2023 10:46      LOS: 1 day   Ole Bourdon, NP [facility] Pager: [phone]  11/14/2023, 12:55 PM

## 2023-11-14 NOTE — Evaluation (Signed)
 Physical Therapy Evaluation Patient Details Name: [name] [name] MRN: [id number] DOB: Jul 24, 1955 Today's Date: 11/14/2023  History of Present Illness  68 y.o. male admitted 11/12/23 for abnormal renal labs. Found to have AKI. Foley catheter was inserted.  He has since been having hematuria, possibly trauma in the setting of Eliquis . Pt with worsening of chronic normocytic anemia. Plan for EGD and colonoscopy. PMHx: PAF, chronic A-Fib, CHF, CAD s/p CABG, PAD, T1DM, HTN, HLD, chronic decubitus ulcer,  chronic anemia with heme positive stool, and chronic diarrhea. Of note, recent hospitalization 10/22-11/5 after fall sustaining L SDH with 4mm shift.   Clinical Impression  Pt admitted with above diagnosis. Examination limited by impaired cognition, pt being a poor historian, and lack of family present. Pt was receiving therapy services at [facility]. He reports ambulating using rollator and denies fall history despite being recently admitted d/t fall sustaining injury. Pt currently with functional limitations due to the deficits listed below (see PT Problem List). He required CGA for bed mobility, minA for sit<>stand using RW, and minA x2 to take a couple of steps using RW. Pt became increasingly irritated and frustrated during session declining to complete further mobility despite the presence of +2 assist for safety/support. He is currently limited by confusion, decreased insight, poor safety awareness, generalized weakness, impaired balance, and decreased activity tolerance. Pt will benefit from acute skilled PT to increase his independence and safety with mobility to allow discharge. Recommend continued inpatient follow up therapy, <3 hours/day.    If plan is discharge home, recommend the following: Two people to help with walking and/or transfers;Assistance with cooking/housework;Assist for transportation;Help with stairs or ramp for entrance;Two people to help with  bathing/dressing/bathroom;Supervision due to cognitive status;Direct supervision/assist for medications management   Can travel by private vehicle   No    Equipment Recommendations None recommended by PT  Recommendations for Other Services       Functional Status Assessment Patient has had a recent decline in their functional status and demonstrates the ability to make significant improvements in function in a reasonable and predictable amount of time.     Precautions / Restrictions Precautions Precautions: Fall Recall of Precautions/Restrictions: Impaired Precaution/Restrictions Comments: Pt with impaired vision Restrictions Weight Bearing Restrictions Per Provider Order: No      Mobility  Bed Mobility Overal bed mobility: Needs Assistance Bed Mobility: Supine to Sit     Supine to sit: Contact guard, HOB elevated, Used rails     General bed mobility comments: Pt sat up on L side of bed with increased time. He brought BLE off EOB and elevated trunk.    Transfers Overall transfer level: Needs assistance Equipment used: Rolling walker (2 wheels) Transfers: Sit to/from Stand, Bed to chair/wheelchair/BSC Sit to Stand: Min assist   Step pivot transfers: Min assist, +2 safety/equipment       General transfer comment: Pt stood from lowest bed height. Cued proper hand placement using RW. Powered up with minA. Transferred to recliner chair on left. Fair eccentric control. Assist to bring RW all the way back and cues for pt to reach back to recliner chair arm rest.    Ambulation/Gait Ambulation/Gait assistance: Min assist, +2 safety/equipment Gait Distance (Feet): 3 Feet (x2) Assistive device: Rolling walker (2 wheels) Gait Pattern/deviations: Step-to pattern, Decreased step length - right, Decreased step length - left, Decreased stride length Gait velocity: decreased     General Gait Details: Pt took a couple of steps to transfer to recliner chair. Encouraged pt  to  attempt further gait, d/t presence of +2 assist and ability to have chair follow. Pt initially declined. He took a seated rest break and then agreed to walk a little bit more. Pt took short slow steps. He was unsteady and then all of sudden stopped saying no more mobility.  Stairs            Wheelchair Mobility     Tilt Bed    Modified Rankin (Stroke Patients Only)       Balance Overall balance assessment: History of Falls, Needs assistance Sitting-balance support: Bilateral upper extremity supported, Feet supported Sitting balance-Leahy Scale: Fair     Standing balance support: Bilateral upper extremity supported, During functional activity, Reliant on assistive device for balance Standing balance-Leahy Scale: Poor                               Pertinent Vitals/Pain Pain Assessment Pain Assessment: Faces Faces Pain Scale: Hurts little more Pain Location: Generalized Pain Descriptors / Indicators: Discomfort, Aching Pain Intervention(s): Monitored during session, Limited activity within patient's tolerance, Repositioned    Home Living Family/patient expects to be discharged to:: Skilled nursing facility Living Arrangements: Alone Available Help at Discharge: Family;Available PRN/intermittently Type of Home: Apartment Home Access: Level entry       Home Layout: One level Home Equipment: Grab bars - toilet;Grab bars - tub/shower;Rollator (4 wheels);Wheelchair - manual;Shower seat      Prior Function Prior Level of Function : Patient poor historian/Family not available;Needs assist;History of Falls (last six months)       Physical Assist : ADLs (physical)   ADLs (physical): IADLs;Bathing Mobility Comments: Per chart review, ambulates using rollator. Pt unable to elaborate on what he has been working on in therapy at the facility. Pt sustained a fall in October resulting in SDH. ADLs Comments: Per chart review, pt modI with most ADLs before October  and would orders groceries, get transportation through insurance, and used pill box. Sounds like facility staff have been assisting with all ADLs/IADLs lately.     Extremity/Trunk Assessment   Upper Extremity Assessment Upper Extremity Assessment: Generalized weakness;Right hand dominant    Lower Extremity Assessment Lower Extremity Assessment: Generalized weakness    Cervical / Trunk Assessment Cervical / Trunk Assessment: Other exceptions Cervical / Trunk Exceptions: Thoracic Rounding  Communication   Communication Communication: Impaired Factors Affecting Communication: Hearing impaired    Cognition Arousal: Alert Behavior During Therapy: Flat affect, Agitated   PT - Cognitive impairments: No family/caregiver present to determine baseline                       PT - Cognition Comments: Pt A,Ox4. He became increasing irritated by therapist's request for mobility reporting no and declining to answer any follow-up questions for clarity. Decreased insight into current condition and poor safety awareness. Pt reported he was going to get out of recliner chair on his own, RNs notified, fall prevention items in place, chair alarm on, and feet elevated. Following commands: Impaired Following commands impaired: Follows one step commands with increased time, Follows one step commands inconsistently     Cueing Cueing Techniques: Verbal cues, Tactile cues     General Comments General comments (skin integrity, edema, etc.): VSS on RA    Exercises     Assessment/Plan    PT Assessment Patient needs continued PT services  PT Problem List Decreased strength;Decreased activity tolerance;Decreased balance;Decreased mobility;Decreased knowledge  of use of DME;Decreased knowledge of precautions;Cardiopulmonary status limiting activity;Decreased cognition;Decreased safety awareness       PT Treatment Interventions DME instruction;Gait training;Functional mobility  training;Therapeutic activities;Therapeutic exercise;Balance training;Patient/family education    PT Goals (Current goals can be found in the Care Plan section)  Acute Rehab PT Goals PT Goal Formulation: Patient unable to participate in goal setting Time For Goal Achievement: 11/28/23 Potential to Achieve Goals: Fair    Frequency Min 2X/week     Co-evaluation               AM-PAC PT 6 Clicks Mobility  Outcome Measure Help needed turning from your back to your side while in a flat bed without using bedrails?: A Little Help needed moving from lying on your back to sitting on the side of a flat bed without using bedrails?: A Little Help needed moving to and from a bed to a chair (including a wheelchair)?: Total Help needed standing up from a chair using your arms (e.g., wheelchair or bedside chair)?: A Little Help needed to walk in hospital room?: Total Help needed climbing 3-5 steps with a railing? : Total 6 Click Score: 12    End of Session Equipment Utilized During Treatment: Gait belt Activity Tolerance: Patient tolerated treatment well;Other (comment) (Treatment limited secondary to pt's willingness to participate) Patient left: in chair;with call bell/phone within reach;with chair alarm set Nurse Communication: Mobility status PT Visit Diagnosis: Unsteadiness on feet (R26.81);Other abnormalities of gait and mobility (R26.89);Muscle weakness (generalized) (M62.81);History of falling (Z91.81);Dizziness and giddiness (R42);Difficulty in walking, not elsewhere classified (R26.2)    Time: 8860-8795 PT Time Calculation (min) (ACUTE ONLY): 25 min   Charges:   PT Evaluation $PT Eval Moderate Complexity: 1 Mod   PT General Charges $$ ACUTE PT VISIT: 1 Visit         Randall SAUNDERS, PT, DPT Acute Rehabilitation Services Office: [phone] Secure Chat Preferred  Delon CHRISTELLA Callander 11/14/2023, 1:21 PM

## 2023-11-15 ENCOUNTER — Inpatient Hospital Stay (HOSPITAL_COMMUNITY)

## 2023-11-15 DIAGNOSIS — R195 Other fecal abnormalities: Secondary | ICD-10-CM | POA: Diagnosis not present

## 2023-11-15 DIAGNOSIS — J869 Pyothorax without fistula: Secondary | ICD-10-CM | POA: Diagnosis not present

## 2023-11-15 DIAGNOSIS — N179 Acute kidney failure, unspecified: Secondary | ICD-10-CM | POA: Diagnosis not present

## 2023-11-15 DIAGNOSIS — J90 Pleural effusion, not elsewhere classified: Secondary | ICD-10-CM | POA: Diagnosis not present

## 2023-11-15 DIAGNOSIS — R310 Gross hematuria: Secondary | ICD-10-CM | POA: Diagnosis not present

## 2023-11-15 DIAGNOSIS — D509 Iron deficiency anemia, unspecified: Secondary | ICD-10-CM | POA: Diagnosis not present

## 2023-11-15 LAB — PATHOLOGIST SMEAR REVIEW

## 2023-11-15 LAB — BASIC METABOLIC PANEL WITH GFR
Anion gap: 10 (ref 5–15)
BUN: 29 mg/dL — ABNORMAL HIGH (ref 8–23)
CO2: 24 mmol/L (ref 22–32)
Calcium: 7.9 mg/dL — ABNORMAL LOW (ref 8.9–10.3)
Chloride: 105 mmol/L (ref 98–111)
Creatinine, Ser: 0.7 mg/dL (ref 0.61–1.24)
GFR, Estimated: >60 mL/min (ref >60)
Glucose, Bld: 169 mg/dL — ABNORMAL HIGH (ref 70–99)
Potassium: 4.6 mmol/L (ref 3.5–5.1)
Sodium: 139 mmol/L (ref 135–145)

## 2023-11-15 LAB — BODY FLUID CELL COUNT WITH DIFFERENTIAL
Eos, Fluid: 0 %
Lymphs, Fluid: 96 %
Monocyte-Macrophage-Serous Fluid: 2 % — ABNORMAL LOW (ref 50–90)
Neutrophil Count, Fluid: 2 % (ref 0–25)
Total Nucleated Cell Count, Fluid: 109 uL (ref 0–1000)

## 2023-11-15 LAB — BPAM RBC
Blood Product Expiration Date: 202512042359
ISSUE DATE / TIME: 202511130806
Unit Type and Rh: 5100

## 2023-11-15 LAB — TYPE AND SCREEN
ABO/RH(D): O POS
Antibody Screen: NEGATIVE
Unit division: 0

## 2023-11-15 LAB — GLUCOSE, CAPILLARY
Glucose-Capillary: 137 mg/dL — ABNORMAL HIGH (ref 70–99)
Glucose-Capillary: 173 mg/dL — ABNORMAL HIGH (ref 70–99)
Glucose-Capillary: 176 mg/dL — ABNORMAL HIGH (ref 70–99)
Glucose-Capillary: 158 mg/dL — ABNORMAL HIGH (ref 70–99)
Glucose-Capillary: 115 mg/dL — ABNORMAL HIGH (ref 70–99)

## 2023-11-15 LAB — URINE CULTURE: Culture: 20000 — AB

## 2023-11-15 LAB — ALBUMIN, PLEURAL OR PERITONEAL FLUID: Albumin, Fluid: <1.5 g/dL

## 2023-11-15 LAB — CBC
HCT: 24.6 % — ABNORMAL LOW (ref 39.0–52.0)
Hemoglobin: 7.8 g/dL — ABNORMAL LOW (ref 13.0–17.0)
MCH: 27.7 pg (ref 26.0–34.0)
MCHC: 31.7 g/dL (ref 30.0–36.0)
MCV: 87.2 fL (ref 80.0–100.0)
Platelets: 199 K/uL (ref 150–400)
RBC: 2.82 MIL/uL — ABNORMAL LOW (ref 4.22–5.81)
RDW: 16.1 % — ABNORMAL HIGH (ref 11.5–15.5)
WBC: 4.6 K/uL (ref 4.0–10.5)
nRBC: 0 % (ref 0.0–0.2)

## 2023-11-15 LAB — PROTEIN, PLEURAL OR PERITONEAL FLUID: Total protein, fluid: <3 g/dL

## 2023-11-15 LAB — LACTATE DEHYDROGENASE, PLEURAL OR PERITONEAL FLUID: LD, Fluid: 90 U/L — ABNORMAL HIGH (ref 3–23)

## 2023-11-15 LAB — HEMOGLOBIN A1C
Hgb A1c MFr Bld: 8.3 % — ABNORMAL HIGH (ref 4.8–5.6)
Mean Plasma Glucose: 191.51 mg/dL

## 2023-11-15 MED ORDER — STERILE WATER FOR INJECTION IJ SOLN
5.0000 mg | Freq: Once | RESPIRATORY_TRACT | Status: AC
  Administered 2023-11-15: 5 mg via INTRAPLEURAL
  Filled 2023-11-15: qty 5

## 2023-11-15 MED ORDER — SODIUM CHLORIDE 0.9% FLUSH
10.0000 mL | Freq: Three times a day (TID) | INTRAVENOUS | Status: DC
  Administered 2023-11-15 – 2023-12-01 (×31): 10 mL via INTRAPLEURAL

## 2023-11-15 MED ORDER — SODIUM CHLORIDE (PF) 0.9 % IJ SOLN
10.0000 mg | Freq: Once | INTRAMUSCULAR | Status: AC
  Administered 2023-11-15: 10 mg via INTRAPLEURAL
  Filled 2023-11-15: qty 10

## 2023-11-15 MED ORDER — SODIUM CHLORIDE 0.9 % IR SOLN
3000.0000 mL | Status: DC
  Administered 2023-11-15 – 2023-11-16 (×5): 3000 mL

## 2023-11-15 NOTE — Progress Notes (Signed)
 Mobility Specialist Progress Note:    11/15/23 1208  Mobility  Activity Ambulated with assistance  Level of Assistance Contact guard assist, steadying assist  Assistive Device Front wheel walker  Distance Ambulated (ft) 12 ft  Activity Response Tolerated well  Mobility Referral Yes  Mobility visit 1 Mobility  Mobility Specialist Start Time (ACUTE ONLY) 0930  Mobility Specialist Stop Time (ACUTE ONLY) 0945  Mobility Specialist Time Calculation (min) (ACUTE ONLY) 15 min   Pt received in bed, hesitant but agreeable to mobility w/ encouragement. Only agreeable to short distance unable to explain why. No physical assistance required, contact guard for safety. Returned to bed w/o fault. Call bell and personal belongings in reach. All needs met. Bed alarm on.  Thersia Minder Mobility Specialist  Please contact vis Secure Chat or  Rehab Office [phone]

## 2023-11-15 NOTE — Plan of Care (Signed)
  Problem: Education: Goal: Knowledge of General Education information will improve Description: Including pain rating scale, medication(s)/side effects and non-pharmacologic comfort measures Outcome: Progressing   Problem: Clinical Measurements: Goal: Respiratory complications will improve Outcome: Progressing   Problem: Pain Managment: Goal: General experience of comfort will improve and/or be controlled Outcome: Progressing   Problem: Activity: Goal: Risk for activity intolerance will decrease Outcome: Not Progressing   Problem: Elimination: Goal: Will not experience complications related to urinary retention Outcome: Not Progressing

## 2023-11-15 NOTE — Progress Notes (Signed)
     Subjective: Patient resting comfortably in bed.  No acute events overnight.  His son was at bedside and we reviewed the case and plan.  Patient initially agreed to three-way catheter placement, and then refused care until tomorrow so that he could finish his [employer].  He eventually consented.  Please see separate procedure note.  Objective: Vital signs in last 24 hours: Temp:  [97.8 F (36.6 C)-98.8 F (37.1 C)] 98.3 F (36.8 C) (11/14 0808) Pulse Rate:  [52-94] 94 (11/14 0808) Resp:  [15-18] 16 (11/14 0808) BP: (142-148)/(70-77) 142/73 (11/14 0808) SpO2:  [96 %-100 %] 98 % (11/14 0808)  Assessment/Plan: # Gross hematuria #AKI #hemorrhagic cystis  Possible hemorrhagic cystitis, improvement overnight with ABX Trend H&H. Transfusion 1u PRBC 11/13 Little improvement overnight following every 2 hand irrigation.  Discussed the concern I had with the patient remaining off of his anticoagulation, and consideration of for stroke last year.  Shared decision was made to proceed with three-way Foley catheter placement and CBI.  No clots present during hand irrigation. CBI on low gtt., light pink irrigant.  Urology will follow  Intake/Output from previous day: 11/13 0701 - 11/14 0700 In: 842.6 [Blood:396; IV Piggyback:296.6] Out: 3000 [Urine:3000]  Intake/Output this shift: Total I/O In: 100 [P.O.:100] Out: 625 [Urine:625]  Physical Exam:  General: Alert and mildly confused CV: No cyanosis Lungs: equal chest rise Abdomen: Soft, NTND, no rebound or guarding Gu: 38f three-way hematuria coud in place with CBI on low gtt., draining light pink irrigant.  Lab Results: Recent Labs    11/14/23 0512 11/14/23 1758 11/15/23 0219  HGB 6.7* 8.5* 7.8*  HCT 21.5* 26.8* 24.6*   BMET Recent Labs    11/14/23 0512 11/14/23 1758 11/15/23 0219  NA 138  --  139  K 4.3  --  4.6  CL 107  --  105  CO2 24  --  24  GLUCOSE 208*  --  169*  BUN 52*  --  29*  CREATININE 0.79  --  0.70   CALCIUM  8.0*  --  7.9*  HGB 6.7* 8.5* 7.8*  WBC 4.7  --  4.6     Studies/Results: No results found.     LOS: 2 days   [name] Bourdon, NP [facility] Pager: [phone]  11/15/2023, 1:28 PM

## 2023-11-15 NOTE — Plan of Care (Signed)

## 2023-11-15 NOTE — Progress Notes (Signed)
 Assisted with right chest tube placement. Consent signed and in chart. VSS throughout. Placed to suction, CXR and lytics ordered. Samples taken to lab.

## 2023-11-15 NOTE — Consult Note (Signed)
 NAME:  [name] [name], MRN:  [id number], DOB:  November 28, 1955, LOS: 2 ADMISSION DATE:  11/12/2023, CONSULTATION DATE: 11/14/23  REFERRING MD: Briana CHIEF COMPLAINT: Right Pleural Effusion   History of Present Illness:  Pt is a 68 yr male significant hx of afib RVR on eliquis , HTN, CAD, CABGx4, aortic aneursym s/p repair, CVA, DM2, heart failure with preserved EF, and HLD who presented to ED with hematuria, AKI, and anemia. TRH notified for admission and urology consulted in setting of hematuria. CT abdomen/pelvis obtained showing significant right pleural collection with air-fluid levels concern for infection/empyema. Patient afebrile and showing no signs of elevated WBC at this time. PCCM consulted for possible thoracentesis vs chest tube placement in setting of empyema   Pertinent  Medical History   Past Medical History:  Diagnosis Date   Acute urinary retention 01/04/2022   Altered mental status 08/18/2023   Anxiety    Arthritis    Cataract    CHF (congestive heart failure) (HCC)    Constipation    pt states goes EOD- hard stools    Coronary artery disease    Depression    Diabetes mellitus without complication (HCC)    Diabetic retinopathy (HCC)    DKA (diabetic ketoacidoses)    Hyperlipidemia    Hypertension    off meds   Hypertensive retinopathy    Lesion of ulnar nerve, left upper limb 07/12/2017   Non-intractable vomiting without nausea 06/10/2018   Postherpetic neuralgia 08/11/2021   Speculative diagnosis   Psoriasis    Tubular adenoma of colon 03/2019   UTI (urinary tract infection) 01/05/2022     Significant Hospital Events: Including procedures, antibiotic start and stop dates in addition to other pertinent events   11/13 Pulm Consult- for empyema on CT abdomen/pelvis 11/14 Chest tube placed for empyema    Interim History / Subjective:  Pleasantly confused in no distress    Objective    Blood pressure (!) 157/74, pulse (!) 59, temperature 98.3 F (36.8  C), temperature source Oral, resp. rate 18, height 6' 3 (1.905 m), weight 68 kg, SpO2 93%.        Intake/Output Summary (Last 24 hours) at 11/15/2023 1814 Last data filed at 11/15/2023 1620 Gross per 24 hour  Intake 396.64 ml  Output 3475 ml  Net -3078.36 ml   Filed Weights   11/12/23 2154  Weight: 68 kg    Examination: General: acute on chronic older adult male, sitting up in floor bed, NAD HEENT: Normocephalic, PERRLA intact, Pink MM  CV: s1,s2, RRR, no MRG, No JVD, right chest tube in place pulm: clear, diminished, no distress  Abs: bs active, soft  Extremities: no edema, no deformity, moves all extremities on command  Skin: no rash  Neuro: Rass 0 responds to painful stimuli, cough gag reflex present  GU: foley intact- 3 way, yellow, bladder irrigation   11/13 Image:      Resolved problem list   Assessment and Plan  Empyema  Loculated pleural effusion right side  Hx of right thoracentesis in setting of CHF 2/25  Chest tube right placed 11/14  CT abdomen/pelvis obtained showing significant right pleural collection with air-fluid levels concern for infection/empyema. P:  Chest tube placed- right side- pleural fluid obtained, sent for cytology, culture, labs  First dose lytics given, will repeat CXR for AM Will more than likely get another dose tomorrow Continue rocephin   Continue Flagyl  Monitor Chest tube output  If patient develops severe chest pain, difficulty to breath,  obtain STAT chest x-ray, call PCCM - From 7A-7P if no response, please call [phone]. After hours, please call ELink [phone].    Other Problem List managed by Primary Gross Hematuria  Possible UTI Anemia IDA AKI PAF CAD DM Aortic atherosclerosis  Chronic HFpEF PVD Left Foot Wound GAD Depression  Cognitive Dysfunction     Labs   CBC: Recent Labs  Lab 11/12/23 2202 11/12/23 2243 11/14/23 0512 11/14/23 1758 11/15/23 0219  WBC 6.4  --  4.7  --  4.6  NEUTROABS  4.9  --   --   --   --   HGB 7.5* 7.8* 6.7* 8.5* 7.8*  HCT 24.8* 23.0* 21.5* 26.8* 24.6*  MCV 89.2  --  87.0  --  87.2  PLT 244  --  200  --  199    Basic Metabolic Panel: Recent Labs  Lab 11/12/23 2202 11/12/23 2243 11/14/23 0512 11/15/23 0219  NA 136 137 138 139  K 4.9 5.0 4.3 4.6  CL 100 100 107 105  CO2 27  --  24 24  GLUCOSE 235* 224* 208* 169*  BUN 94* 93* 52* 29*  CREATININE 1.48* 1.50* 0.79 0.70  CALCIUM  8.4*  --  8.0* 7.9*   GFR: Estimated Creatinine Clearance: 82.6 mL/min (by C-G formula based on SCr of 0.7 mg/dL). Recent Labs  Lab 11/12/23 2202 11/14/23 0512 11/15/23 0219  WBC 6.4 4.7 4.6    Liver Function Tests: Recent Labs  Lab 11/12/23 2202 11/14/23 0512  AST 27 17  ALT 32 26  ALKPHOS 177* 132*  BILITOT <0.2 0.4  PROT 6.7 5.9*  ALBUMIN 2.2* 1.9*   No results for input(s): LIPASE, AMYLASE in the last 168 hours. No results for input(s): AMMONIA in the last 168 hours.  ABG    Component Value Date/Time   HCO3 23.5 10/23/2023 1011   TCO2 27 11/12/2023 2243   ACIDBASEDEF 1.0 10/23/2023 1011   O2SAT 94 10/23/2023 1011     Coagulation Profile: Recent Labs  Lab 11/12/23 2202  INR 1.6*    Cardiac Enzymes: No results for input(s): CKTOTAL, CKMB, CKMBINDEX, TROPONINI in the last 168 hours.  HbA1C: HbA1c POC (<> result, manual entry)  Date/Time Value Ref Range Status  10/11/2020 11:34 AM >15 4.0 - 5.6 % Final    Comment:    Too High   Hgb A1c MFr Bld  Date/Time Value Ref Range Status  11/15/2023 02:19 AM 8.3 (H) 4.8 - 5.6 % Final    Comment:    (NOTE) Diagnosis of Diabetes The following HbA1c ranges recommended by the American Diabetes Association (ADA) may be used as an aid in the diagnosis of diabetes mellitus.  Hemoglobin             Suggested A1C NGSP%              Diagnosis  <5.7                   Non Diabetic  5.7-6.4                Pre-Diabetic  >6.4                   Diabetic  <7.0                    Glycemic control for                       adults with diabetes.  07/26/2023 09:19 AM 9.1 (H) 4.6 - 6.5 % Final    Comment:    Glycemic Control Guidelines for People with Diabetes:Non Diabetic:  <6%Goal of Therapy: <7%Additional Action Suggested:  >8%     CBG: Recent Labs  Lab 11/14/23 2119 11/15/23 0615 11/15/23 0844 11/15/23 1129 11/15/23 1636  GLUCAP 164* 137* 173* 176* 158*    Total Time: 50 mins    Christian Zymier Rodgers AGACNP-BC   [facility] 11/15/2023, 6:14 PM  Please see [email or web address] for pager details.  From 7A-7P if no response, please call [phone]. After hours, please call ELink [phone].

## 2023-11-15 NOTE — TOC Progression Note (Addendum)
 Transition of Care [facility]) - Progression Note    Patient Details  Name: [name] [name] MRN: [id number] Date of Birth: [birth year]  Transition of Care [facility]) CM/SW Contact  [name], LCSW Phone Number: 11/15/2023, 12:57 PM  Clinical Narrative:   Pt has one bed offer at [facility]  [facility] still considering.  Son [name] updated.  He would like referral sent to other locations in [place], [place], [place] counties.  Referral sent out in hub.    1415: several more bed offers provided to son.   1545: [name] would be interested in the offer at [facility].  CSW reached out to [name]/Lucan to update her.  Expected Discharge Plan: Skilled Nursing Facility Barriers to Discharge: Continued Medical Work up               Expected Discharge Plan and Services In-house Referral: Clinical Social Work   Post Acute Care Choice: Skilled Nursing Facility Living arrangements for the past 2 months: Single Family Home                                       Social Drivers of Health (SDOH) Interventions SDOH Screenings   Food Insecurity: No Food Insecurity (11/13/2023)  Recent Concern: Food Insecurity - Food Insecurity Present (08/18/2023)  Housing: Low Risk  (11/13/2023)  Transportation Needs: No Transportation Needs (11/13/2023)  Utilities: Not At Risk (11/13/2023)  Alcohol Screen: Low Risk  (08/19/2023)  Depression (PHQ2-9): Medium Risk (07/26/2023)  Financial Resource Strain: Low Risk  (08/19/2023)  Physical Activity: Inactive (11/21/2022)  Social Connections: Socially Isolated (11/13/2023)  Stress: Stress Concern Present (11/21/2022)  Tobacco Use: High Risk (11/12/2023)  Health Literacy: Adequate Health Literacy (11/21/2022)    Readmission Risk Interventions    08/20/2023    4:20 PM  Readmission Risk Prevention Plan  Transportation Screening Complete  PCP or Specialist Appt within 3-5 Days Complete  HRI or Home Care  Consult Complete  Palliative Care Screening Not Applicable  Medication Review (RN Care Manager) Complete

## 2023-11-15 NOTE — Progress Notes (Addendum)
 PROGRESS NOTE    [name]  FMW:[id number] DOB: 11-29-1955 DOA: 11/12/2023 PCP: [name] HERO, PA-C   Brief Narrative: [name] [name] is a 68 y.o. male with a history of atrial fibrillation with slow ventricular response, CAD, CABG x 4, aortic aneurysm s/p repair, CVA, diabetes mellitus type 2, heart failure with preserved EF.  Patient presented secondary to abnormal metabolic panel and while in the emergency department found to have evidence of gross hematuria, anemia and AKI.  Urology consulted.  Patient started IV fluids.  During workup, patient was Hemoccult positive, GI consulted.  Hemoglobin continued to drift down requiring 1 unit PRBC via transfusion.  CT imaging on admission was significant for right pleural effusion concerning for possible empyema.  Pulmonology consulted for chest tube placement.   Assessment and Plan:  Gross hematuria Possibly related to foley catheter trauma and complicated by Eliquis  use. Urology consulted. CT abdomen/pelvis significant for normal kidneys without hydronephrosis, nonobstructing stone versus vascular calcification of the upper pole left kidney, no renal mass.  Hematuria catheter was declined by patient.  Per urology, possible hemorrhagic cystitis. -Urology recommendations: Treat for possible UTI, continue Foley with continued attempts for replacement of catheter without hematuria catheter, trend H&H  Possible UTI Urine culture obtained. Patient started empirically on Ceftriaxone . Urinalysis with rare bacteruria, pyuria and is nitrite positive; significant squamous cells. -Continue Ceftriaxone  IV  Acute blood loss anemia Chronic iron  deficiency anemia Baseline hemoglobin appears to be around 8-9.  Hemoglobin of 7.5 on admission with downtrend to 6.7 likely related to patient's gross hematuria in addition to heme positive stool.  1 unit of PRBC ordered on 11/13 with posttransfusion hemoglobin up to 8.5.  Hemoglobin down to 7.8  this morning. -Trend CBC  AKI Present on admission with creatinine of 1.48 and peak of 1.50. Resolved.  Heme positive stool No history of hematochezia or melena.  BUN significantly elevated on admission, but patient also has evidence of dehydration.  Complicated by Eliquis  use. GI consulted and have recommended inpatient EGD/Colonoscopy. Patient continues to adamantly decline endoscopy procedures.  Paroxysmal atrial fibrillation with slow ventricular response Patient is not on rate control medication secondary to slow ventricular response.  He is managed on Eliquis  secondary to a CHA2DS2-VASc of 4.  Eliquis  held secondary to hematuria and associated acute anemia.  Right pleural effusion Noted on CT imaging with evidence of moderate-sized collection.  Concern for possible empyema secondary to gas seen on imaging.  Last thoracentesis was from February 2025, which was also right-sided and was secondary to heart failure.  Patient is completely asymptomatic.  Unsure if this would require thoracentesis versus consideration of a chest tube. -Pulmonology consultation for recommendations: plan for chest tube placement on 11/14  Diabetes mellitus type 2 Uncontrolled with hyperglycemia based on last hemoglobin A1c of 8.3%. -SSI  CAD S/p CABG x4.  Aortic atherosclerosis Noted on CT imaging.  Aortic aneurysm Stable.  Chronic HFpEF Currently stable.  Peripheral vascular disease Noted. Not on medication therapy.  Ambulatory dysfunction Noted. -PT/OT recommendations: SNF  Left foot wound Noted.  Generalized anxiety disorder Depression -Continue Paxil  and Remeron   Cognitive dysfunction Noted. -Continue Seroquel    DVT prophylaxis: SCDs Code Status:   Code Status: Full Code Family Communication: Son on telephone (11/14) Disposition Plan: Discharge pending ongoing specialist recommendations, stable hemoglobin   Consultants:  Gastroenterology Urology PCCM  Procedures:   None  Antimicrobials: Ceftriaxone     Subjective: Patient reports no issues overnight.  Patient continues to be adamant that he  does not want GI endoscopy procedures this admission.  Objective: BP (!) 142/73 (BP Location: Left Arm)   Pulse 94   Temp 98.3 F (36.8 C) (Oral)   Resp 16   Ht 6' 3 (1.905 m)   Wt 68 kg   SpO2 98%   BMI 18.75 kg/m   Examination:  General exam: Appears calm and comfortable. Respiratory system: Clear to auscultation. Respiratory effort normal. Cardiovascular system: S1 & S2 heard, RRR. No murmur. Gastrointestinal system: Abdomen is nondistended, soft and nontender. Normal bowel sounds heard. Central nervous system: Alert and oriented to person place and time. Musculoskeletal: No edema. No calf tenderness Psychiatry: Short-term memory is impaired. Mood & affect appropriate.    Data Reviewed: I have personally reviewed following labs and imaging studies  CBC Lab Results  Component Value Date   WBC 4.6 11/15/2023   RBC 2.82 (L) 11/15/2023   HGB 7.8 (L) 11/15/2023   HCT 24.6 (L) 11/15/2023   MCV 87.2 11/15/2023   MCH 27.7 11/15/2023   PLT 199 11/15/2023   MCHC 31.7 11/15/2023   RDW 16.1 (H) 11/15/2023   LYMPHSABS 0.6 (L) 11/12/2023   MONOABS 0.7 11/12/2023   EOSABS 0.2 11/12/2023   BASOSABS 0.0 11/12/2023     Last metabolic panel Lab Results  Component Value Date   NA 139 11/15/2023   K 4.6 11/15/2023   CL 105 11/15/2023   CO2 24 11/15/2023   BUN 29 (H) 11/15/2023   CREATININE 0.70 11/15/2023   GLUCOSE 169 (H) 11/15/2023   GFRNONAA >60 11/15/2023   GFRAA >60 06/09/2018   CALCIUM  7.9 (L) 11/15/2023   PHOS 3.9 02/07/2023   PROT 5.9 (L) 11/14/2023   ALBUMIN 1.9 (L) 11/14/2023   LABGLOB 3.8 08/26/2023   BILITOT 0.4 11/14/2023   ALKPHOS 132 (H) 11/14/2023   AST 17 11/14/2023   ALT 26 11/14/2023   ANIONGAP 10 11/15/2023    GFR: Estimated Creatinine Clearance: 82.6 mL/min (by C-G formula based on SCr of 0.7  mg/dL).  Recent Results (from the past 240 hours)  Urine Culture     Status: Abnormal (Preliminary result)   Collection Time: 11/13/23  1:10 AM   Specimen: Urine, Catheterized  Result Value Ref Range Status   Specimen Description URINE, CATHETERIZED  Final   Special Requests NONE  Final   Culture (A)  Final    20,000 COLONIES/mL STAPHYLOCOCCUS HAEMOLYTICUS SUSCEPTIBILITIES TO FOLLOW Performed at [facility], [address], [place], [place] [address]    Report Status PENDING  Incomplete  MRSA Next Gen by PCR, Nasal     Status: None   Collection Time: 11/13/23  4:02 PM   Specimen: Nasal Mucosa; Nasal Swab  Result Value Ref Range Status   MRSA by PCR Next Gen NOT DETECTED NOT DETECTED Final    Comment: (NOTE) The GeneXpert MRSA Assay (FDA approved for NASAL specimens only), is one component of a comprehensive MRSA colonization surveillance program. It is not intended to diagnose MRSA infection nor to guide or monitor treatment for MRSA infections. Test performance is not FDA approved in patients less than 65 years old. Performed at [facility], [address] [place], [place] [address]       Radiology Studies: CT ABDOMEN PELVIS WO CONTRAST Result Date: 11/13/2023 CLINICAL DATA:  Abdominal/flank pain.  Possible renal stone. EXAM: CT ABDOMEN AND PELVIS WITHOUT CONTRAST TECHNIQUE: Multidetector CT imaging of the abdomen and pelvis was performed following the standard protocol without IV contrast. RADIATION DOSE  REDUCTION: This exam was performed according to the departmental dose-optimization program which includes automated exposure control, adjustment of the mA and/or kV according to patient size and/or use of iterative reconstruction technique. COMPARISON:  CT abdomen/pelvis 11/24/2022, CT chest 08/18/2023 FINDINGS: Lower chest: Median sternotomy wires are present. Heart is normal size. Calcified plaque over the descending thoracic aorta. The lung bases demonstrate a  moderate size right pleural fluid collection as seen previously as there are now air-fluid levels within this pleural fluid collection suggesting possible infected fluid collection/empyema versus recent instrumentation. Stable associated compressive atelectasis over the right lower lobe. Curvilinear atelectasis over the right middle lobe. Small left pleural effusion with associated left basilar atelectasis. Mild bronchiectatic change over the left base. Hepatobiliary: Liver and biliary tree are unremarkable. Gallbladder is somewhat contracted with fluid level suggesting sludge versus noncalcified stones. Fluid adjacent the gallbladder. Pancreas: Mild fatty atrophy is pancreas is otherwise unremarkable. Spleen: Normal. Adrenals/Urinary Tract: Adrenal glands unchanged. Kidneys are normal in size without hydronephrosis. Punctate nonobstructing stone versus vascular calcification projecting over the mid to upper pole left kidney. No focal renal mass. Ureters are normal. Foley catheter is present within the bladder with air over the nondependent portion of the bladder. Stomach/Bowel: Stomach and small bowel are normal. Appendix is normal. Moderate fecal retention over the rectum as the colon is otherwise unremarkable. Vascular/Lymphatic: Calcified plaque over the abdominal aorta with stable 3 cm aneurysm of the distal abdominal aorta. Stable aneurysmal dilatation of the common iliac arteries with the right measuring 2.6 cm and left measuring 2.2 cm. No adenopathy. Reproductive: Prostate is unremarkable. Penile prosthesis with reservoir unchanged. Other: Minimal scattered free peritoneal fluid is present. Musculoskeletal: No focal abnormality. Stable symmetric heterogeneous sclerosis of the femoral heads IMPRESSION: 1. Moderate size right pleural fluid collection with new air-fluid levels suggesting possible infected fluid collection/empyema versus recent instrumentation. Stable associated compressive atelectasis over the  right lower lobe. Small left pleural effusion with associated left basilar atelectasis. 2. Gallbladder is somewhat contracted with fluid level suggesting sludge versus noncalcified stones. Fluid adjacent the gallbladder. Recommend clinical correlation and possible ultrasound to exclude acute cholecystitis. 3. Minimal scattered free peritoneal fluid. 4. Stable 3 cm aneurysm of the distal abdominal aorta. Stable aneurysmal dilatation of the common iliac arteries with the right measuring 2.6 cm and left measuring 2.2 cm. Recommend follow-up ultrasound every 3 years. 5. Aortic atherosclerosis. Aortic Atherosclerosis (ICD10-I70.0). Electronically Signed   By: Toribio Agreste M.D.   On: 11/13/2023 10:46      LOS: 2 days    Elgin Lam, MD [facility] 11/15/2023, 9:50 AM   If 7PM-7AM, please contact night-coverage [email or web address]

## 2023-11-15 NOTE — Progress Notes (Signed)
 Insertion of Chest Tube Procedure Note  Aristeo Hankerson  [id number]  1955/10/06  Date:11/15/23  Time:5:58 PM    Provider Performing: [name] JAYSON Sharps   Procedure: Chest Tube Insertion [phone])  Indication(s) Effusion  Consent Risks of the procedure as well as the alternatives and risks of each were explained to the patient and/or caregiver.  Consent for the procedure was obtained and is signed in the bedside chart  Anesthesia Topical only with 1% lidocaine     Time Out Verified patient identification, verified procedure, site/side was marked, verified correct patient position, special equipment/implants available, medications/allergies/relevant history reviewed, required imaging and test results available.   Sterile Technique Maximal sterile technique including full sterile barrier drape, hand hygiene, sterile gown, sterile gloves, mask, hair covering, sterile ultrasound probe cover (if used).   Procedure Description Ultrasound used to identify appropriate pleural anatomy for placement and overlying skin marked. Area of placement cleaned and draped in sterile fashion.  A 14 French pigtail pleural catheter was placed into the right pleural space using Seldinger technique. Appropriate return of fluid was obtained.  The tube was connected to atrium and placed on -20 cm H2O wall suction.   Complications/Tolerance None; patient tolerated the procedure well. Chest X-ray is ordered to verify placement.   EBL Minimal  Specimen(s) none  Christian Effa Yarrow AGACNP-BC   [facility] 11/15/2023, 6:00 PM  Please see [email or web address] for pager details.  From 7A-7P if no response, please call [phone]. After hours, please call ELink [phone].

## 2023-11-15 NOTE — Consult Note (Signed)
 Pleural Fibrinolytic Administration Procedure Note  Chae Oommen  [id number]  January 05, 1955  Date:11/15/23  Time:6:50 PM   Provider Performing:Lilliane Sposito JAYSON Claudene   Procedure: Pleural Fibrinolysis Initial day [phone])  Indication(s) Fibrinolysis of complicated pleural effusion  Consent Risks of the procedure as well as the alternatives and risks of each were explained to the patient and/or caregiver.  Consent for the procedure was obtained.   Anesthesia None   Time Out Verified patient identification, verified procedure, site/side was marked, verified correct patient position, special equipment/implants available, medications/allergies/relevant history reviewed, required imaging and test results available.   Sterile Technique Hand hygiene, gloves   Procedure Description Existing pleural catheter was cleaned and accessed in sterile manner.  10mg  of tPA in 30cc of saline and 5mg  of dornase in 30cc of sterile water were injected into pleural space using existing pleural catheter.  Catheter will be clamped for 1 hour and then placed back to suction.   Complications/Tolerance None; patient tolerated the procedure well.  EBL None   Specimen(s) None   Sherlean Claudene AGACNP-BC   [facility] 11/15/2023, 6:51 PM  Please see [email or web address] for pager details.  From 7A-7P if no response, please call [phone]. After hours, please call ELink [phone].

## 2023-11-15 NOTE — Procedures (Signed)
   Procedure: insert indwelling catheter bedside   Urology Procedure Note:  68 year old male with AKI, anemia, and gross hematuria with unclear etiology.  He has refused hematuria catheter daily up until this point.  As he has only marginally progressed since admission, shared decision was made to proceed with three-way Foley catheter and CBI.  I have continued to caution him about postponing his anticoagulation any longer than necessary.  10 mL saline balloon was deflated on his existing Foley catheter and this was removed without resistance.  Patient was prepped and draped in the usual sterile fashion.  10 cc of lidocaine  infused lubricant was injected directly into the urethra.  Following that a 30f three-way hematuria coud was advanced to the bladder without difficulty.  The catheter flushed quantitatively and quantitatively.  Having confirmed placement, the retention balloon was inflated with 30 mL of sterile water.  Catheter was placed to gravity drainage without dependent loops.  This concluded the procedure.  CBI set to low gtt.  Ole Bourdon, NP [facility] Pager: [phone]

## 2023-11-16 ENCOUNTER — Inpatient Hospital Stay (HOSPITAL_COMMUNITY)

## 2023-11-16 DIAGNOSIS — R310 Gross hematuria: Secondary | ICD-10-CM | POA: Diagnosis not present

## 2023-11-16 DIAGNOSIS — J948 Other specified pleural conditions: Secondary | ICD-10-CM

## 2023-11-16 DIAGNOSIS — I509 Heart failure, unspecified: Secondary | ICD-10-CM | POA: Diagnosis not present

## 2023-11-16 DIAGNOSIS — N179 Acute kidney failure, unspecified: Secondary | ICD-10-CM | POA: Diagnosis not present

## 2023-11-16 DIAGNOSIS — D509 Iron deficiency anemia, unspecified: Secondary | ICD-10-CM | POA: Diagnosis not present

## 2023-11-16 DIAGNOSIS — R195 Other fecal abnormalities: Secondary | ICD-10-CM | POA: Diagnosis not present

## 2023-11-16 LAB — ALBUMIN: Albumin: 2.1 g/dL — ABNORMAL LOW (ref 3.5–5.0)

## 2023-11-16 LAB — GLUCOSE, CAPILLARY
Glucose-Capillary: 110 mg/dL — ABNORMAL HIGH (ref 70–99)
Glucose-Capillary: 122 mg/dL — ABNORMAL HIGH (ref 70–99)
Glucose-Capillary: 225 mg/dL — ABNORMAL HIGH (ref 70–99)
Glucose-Capillary: 206 mg/dL — ABNORMAL HIGH (ref 70–99)

## 2023-11-16 LAB — CBC
HCT: 26.3 % — ABNORMAL LOW (ref 39.0–52.0)
Hemoglobin: 8.4 g/dL — ABNORMAL LOW (ref 13.0–17.0)
MCH: 27.6 pg (ref 26.0–34.0)
MCHC: 31.9 g/dL (ref 30.0–36.0)
MCV: 86.5 fL (ref 80.0–100.0)
Platelets: 236 K/uL (ref 150–400)
RBC: 3.04 MIL/uL — ABNORMAL LOW (ref 4.22–5.81)
RDW: 16.3 % — ABNORMAL HIGH (ref 11.5–15.5)
WBC: 6.2 K/uL (ref 4.0–10.5)
nRBC: 0 % (ref 0.0–0.2)

## 2023-11-16 LAB — LACTATE DEHYDROGENASE: LDH: 155 U/L (ref 105–235)

## 2023-11-16 LAB — PROTEIN, TOTAL: Total Protein: 6.1 g/dL — ABNORMAL LOW (ref 6.5–8.1)

## 2023-11-16 MED ORDER — FINASTERIDE 5 MG PO TABS
5.0000 mg | ORAL_TABLET | Freq: Every day | ORAL | Status: DC
  Administered 2023-11-16 – 2023-12-24 (×38): 5 mg via ORAL
  Filled 2023-11-16 (×38): qty 1

## 2023-11-16 MED ORDER — SULFAMETHOXAZOLE-TRIMETHOPRIM 800-160 MG PO TABS
1.0000 | ORAL_TABLET | Freq: Two times a day (BID) | ORAL | Status: AC
  Administered 2023-11-16 – 2023-11-22 (×14): 1 via ORAL
  Filled 2023-11-16 (×15): qty 1

## 2023-11-16 NOTE — TOC Progression Note (Signed)
 Transition of Care [facility]) - Progression Note    Patient Details  Name: [name] [name] MRN: [id number] Date of Birth: Jan 05, 1955  [facility]) CM/SW Contact  Isaiah Public, LCSWA Phone Number: 11/16/2023, 4:46 PM  Clinical Narrative:     CSW spoke with patients son [name] and informed him of additional SNF bed offers. [name] informed CSW he is going to review additional offers tonight. CSW to follow up tomorrow on SNF choice.  Expected Discharge Plan: Skilled Nursing Facility Barriers to Discharge: Continued Medical Work up               Expected Discharge Plan and Services In-house Referral: Clinical Social Work   Post Acute Care Choice: Skilled Nursing Facility Living arrangements for the past 2 months: Single Family Home                                       Social Drivers of Health (SDOH) Interventions SDOH Screenings   Food Insecurity: No Food Insecurity (11/13/2023)  Recent Concern: Food Insecurity - Food Insecurity Present (08/18/2023)  Housing: Low Risk  (11/13/2023)  Transportation Needs: No Transportation Needs (11/13/2023)  Utilities: Not At Risk (11/13/2023)  Alcohol Screen: Low Risk  (08/19/2023)  Depression (PHQ2-9): Medium Risk (07/26/2023)  Financial Resource Strain: Low Risk  (08/19/2023)  Physical Activity: Inactive (11/21/2022)  Social Connections: Socially Isolated (11/13/2023)  Stress: Stress Concern Present (11/21/2022)  Tobacco Use: High Risk (11/12/2023)  Health Literacy: Adequate Health Literacy (11/21/2022)    Readmission Risk Interventions    08/20/2023    4:20 PM  Readmission Risk Prevention Plan  Transportation Screening Complete  PCP or Specialist Appt within 3-5 Days Complete  HRI or Home Care Consult Complete  Palliative Care Screening Not Applicable  Medication Review (RN Care Manager) Complete

## 2023-11-16 NOTE — Progress Notes (Addendum)
 NAME:  [name] [name], MRN:  [id number], DOB:  [birth year], LOS: 3 ADMISSION DATE:  11/12/2023, CONSULTATION DATE: 11/14/23  REFERRING MD: Briana CHIEF COMPLAINT: Right Pleural Effusion   History of Present Illness:  Pt is a 68 yr male significant hx of afib RVR on eliquis , HTN, CAD, CABGx4, aortic aneursym s/p repair, CVA, DM2, heart failure with preserved EF, and HLD who presented to ED with hematuria, AKI, and anemia. TRH notified for admission and urology consulted in setting of hematuria. CT abdomen/pelvis obtained showing significant right pleural collection with air-fluid levels concern for infection/empyema. Patient afebrile and showing no signs of elevated WBC at this time. PCCM consulted for possible thoracentesis vs chest tube placement in setting of empyema   Pertinent  Medical History   Past Medical History:  Diagnosis Date   Acute urinary retention 01/04/2022   Altered mental status 08/18/2023   Anxiety    Arthritis    Cataract    CHF (congestive heart failure) (HCC)    Constipation    pt states goes EOD- hard stools    Coronary artery disease    Depression    Diabetes mellitus without complication (HCC)    Diabetic retinopathy (HCC)    DKA (diabetic ketoacidoses)    Hyperlipidemia    Hypertension    off meds   Hypertensive retinopathy    Lesion of ulnar nerve, left upper limb 07/12/2017   Non-intractable vomiting without nausea 06/10/2018   Postherpetic neuralgia 08/11/2021   Speculative diagnosis   Psoriasis    Tubular adenoma of colon 03/2019   UTI (urinary tract infection) 01/05/2022     Significant Hospital Events: Including procedures, antibiotic start and stop dates in addition to other pertinent events   11/13 Pulm Consult- for empyema on CT abdomen/pelvis   Interim History / Subjective:  No complaints of SOB or chest pain  Afebrile Hgb 7.8> 8.4, WBC 6.2 CT output 1.52L since placement 11/14  Objective    Blood pressure (!) 143/73, pulse  64, temperature 98.9 F (37.2 C), resp. rate 16, height 6' 3 (1.905 m), weight 68 kg, SpO2 97%.        Intake/Output Summary (Last 24 hours) at 11/16/2023 1039 Last data filed at 11/16/2023 0818 Gross per 24 hour  Intake 403.36 ml  Output 9595 ml  Net -9191.64 ml   Filed Weights   11/12/23 2154  Weight: 68 kg    Examination: General:  older male lying in bed watching TV in NAD Neuro: Awake, alert, appropriate  CV: rr PULM:  non labored, clear anteriorly, diminished bibasilar, R pigtail CT to 20 sxn, no airleak draining yellowish clear fluid GI: soft, bs+, foley Extremities: warm/dry, no LE edema   No imaging today  Resolved problem list   Assessment and Plan  Right hydropneumothorax Hx of right thoracentesis in setting of CHF 2/25  - CT abdomen/pelvis obtained showing significant right pleural collection with air-fluid levels concern for infection/empyema - s/p CT placement and pleural lytics 11/14 P:  - cont CT to 20 cm sxn, follow drainage - CXR in am - hold on further pleural lytics for now - pleural fluid transudate by light criteria, GS neg, lymphocytic predominant - follow culture, cytology and fungal cx - abx per primary team - Hgb stable - pulmonary will continue to follow for CT management   Other Problem List managed by Primary Gross Hematuria  Possible UTI Anemia IDA AKI PAF CAD DM Aortic atherosclerosis  Chronic HFpEF PVD Left Foot Wound GAD Depression  Cognitive Dysfunction     Labs   CBC: Recent Labs  Lab 11/12/23 2202 11/12/23 2243 11/14/23 0512 11/14/23 1758 11/15/23 0219 11/16/23 0419  WBC 6.4  --  4.7  --  4.6 6.2  NEUTROABS 4.9  --   --   --   --   --   HGB 7.5* 7.8* 6.7* 8.5* 7.8* 8.4*  HCT 24.8* 23.0* 21.5* 26.8* 24.6* 26.3*  MCV 89.2  --  87.0  --  87.2 86.5  PLT 244  --  200  --  199 236    Basic Metabolic Panel: Recent Labs  Lab 11/12/23 2202 11/12/23 2243 11/14/23 0512 11/15/23 0219  NA 136 137 138 139   K 4.9 5.0 4.3 4.6  CL 100 100 107 105  CO2 27  --  24 24  GLUCOSE 235* 224* 208* 169*  BUN 94* 93* 52* 29*  CREATININE 1.48* 1.50* 0.79 0.70  CALCIUM  8.4*  --  8.0* 7.9*   GFR: Estimated Creatinine Clearance: 82.6 mL/min (by C-G formula based on SCr of 0.7 mg/dL). Recent Labs  Lab 11/12/23 2202 11/14/23 0512 11/15/23 0219 11/16/23 0419  WBC 6.4 4.7 4.6 6.2    Liver Function Tests: Recent Labs  Lab 11/12/23 2202 11/14/23 0512 11/16/23 0419  AST 27 17  --   ALT 32 26  --   ALKPHOS 177* 132*  --   BILITOT <0.2 0.4  --   PROT 6.7 5.9* 6.1*  ALBUMIN 2.2* 1.9* 2.1*   No results for input(s): LIPASE, AMYLASE in the last 168 hours. No results for input(s): AMMONIA in the last 168 hours.  ABG    Component Value Date/Time   HCO3 23.5 10/23/2023 1011   TCO2 27 11/12/2023 2243   ACIDBASEDEF 1.0 10/23/2023 1011   O2SAT 94 10/23/2023 1011     Coagulation Profile: Recent Labs  Lab 11/12/23 2202  INR 1.6*    Cardiac Enzymes: No results for input(s): CKTOTAL, CKMB, CKMBINDEX, TROPONINI in the last 168 hours.  HbA1C: HbA1c POC (<> result, manual entry)  Date/Time Value Ref Range Status  10/11/2020 11:34 AM >15 4.0 - 5.6 % Final    Comment:    Too High   Hgb A1c MFr Bld  Date/Time Value Ref Range Status  11/15/2023 02:19 AM 8.3 (H) 4.8 - 5.6 % Final    Comment:    (NOTE) Diagnosis of Diabetes The following HbA1c ranges recommended by the American Diabetes Association (ADA) may be used as an aid in the diagnosis of diabetes mellitus.  Hemoglobin             Suggested A1C NGSP%              Diagnosis  <5.7                   Non Diabetic  5.7-6.4                Pre-Diabetic  >6.4                   Diabetic  <7.0                   Glycemic control for                       adults with diabetes.    07/26/2023 09:19 AM 9.1 (H) 4.6 - 6.5 % Final    Comment:    Glycemic Control Guidelines for People with  Diabetes:Non Diabetic:  <6%Goal of  Therapy: <7%Additional Action Suggested:  >8%     CBG: Recent Labs  Lab 11/15/23 0844 11/15/23 1129 11/15/23 1636 11/15/23 2102 11/16/23 0839  GLUCAP 173* 176* 158* 115* 110*   Allergies Allergies  Allergen Reactions   Other Hives and Itching    Wool fabric - hives, itching     Home Medications  Prior to Admission medications   Medication Sig Start Date End Date Taking? Authorizing Provider  acetaminophen  (TYLENOL ) 500 MG tablet Take 500 mg by mouth every 6 (six) hours as needed for mild pain (pain score 1-3) or moderate pain (pain score 4-6).   Yes [provider]  mirtazapine  (REMERON ) 15 MG tablet TAKE 1 TABLET BY MOUTH EVERYDAY AT BEDTIME 04/23/23  Yes [name], [name], PA-C  PARoxetine  (PAXIL ) 20 MG tablet Take 20 mg by mouth daily.   Yes [provider]  QUEtiapine  (SEROQUEL ) 25 MG tablet Take 25 mg by mouth at bedtime. 09/16/23  Yes [provider]  zinc  oxide 20 % ointment Apply 1 Application topically in the morning, at noon, and at bedtime.   Yes [provider]          Lyle Pesa, NP [facility] 11/16/2023, 12:17 PM  See Amion for pager If no response to pager , please call [phone] until 7pm After 7:00 pm call Elink  [phone]

## 2023-11-16 NOTE — Progress Notes (Signed)
 PROGRESS NOTE    [name] [name]  FMW:[id number] DOB: 06/23/1955 DOA: 11/12/2023 PCP: [name] HERO, PA-C   Brief Narrative: [name] [name] is a 68 y.o. male with a history of atrial fibrillation with slow ventricular response, CAD, CABG x 4, aortic aneurysm s/p repair, CVA, diabetes mellitus type 2, heart failure with preserved EF.  Patient presented secondary to abnormal metabolic panel and while in the emergency department found to have evidence of gross hematuria, anemia and AKI.  Urology consulted.  Patient started IV fluids.  During workup, patient was Hemoccult positive, GI consulted.  Hemoglobin continued to drift down requiring 1 unit PRBC via transfusion.  CT imaging on admission was significant for right pleural effusion concerning for possible empyema.  Pulmonology consulted for chest tube placement which was performed on 11/14.   Assessment and Plan:  Gross hematuria Possibly related to foley catheter trauma and complicated by Eliquis  use. Urology consulted. CT abdomen/pelvis significant for normal kidneys without hydronephrosis, nonobstructing stone versus vascular calcification of the upper pole left kidney, no renal mass.  Hematuria catheter was declined by patient.  Per urology, possible hemorrhagic cystitis. Hematuria catheter placed and CBI started on 11/14. Urine culture positive for 20,000 colonies/ml of staphylococcus hemolyticus. -Urology recommendations: Treat for possible UTI, continue Foley with continued attempts for replacement of catheter without hematuria catheter, trend H&H  Possible UTI Urine culture obtained. Patient started empirically on Ceftriaxone . Urinalysis with rare bacteruria, pyuria and is nitrite positive; significant squamous cells. Urine culture positive for 20,000 colonies/ml of staphylococcus hemolyticus.  -Start Bactrim   Acute blood loss anemia Chronic iron  deficiency anemia Baseline hemoglobin appears to be around 8-9.   Hemoglobin of 7.5 on admission with downtrend to 6.7 likely related to patient's gross hematuria in addition to heme positive stool.  1 unit of PRBC ordered on 11/13 with posttransfusion hemoglobin up to 8.5.  Hemoglobin stable at 8.4 this morning. -Trend CBC  AKI Present on admission with creatinine of 1.48 and peak of 1.50. Resolved.  Heme positive stool No history of hematochezia or melena.  BUN significantly elevated on admission, but patient also has evidence of dehydration.  Complicated by Eliquis  use. GI consulted and have recommended inpatient EGD/Colonoscopy. Patient continues to adamantly decline endoscopy procedures.  Paroxysmal atrial fibrillation with slow ventricular response Patient is not on rate control medication secondary to slow ventricular response.  He is managed on Eliquis  secondary to a CHA2DS2-VASc of 4.  Eliquis  held secondary to hematuria and associated acute anemia.  Right pleural effusion Noted on CT imaging with evidence of moderate-sized collection.  Concern for possible empyema secondary to gas seen on imaging.  Last thoracentesis was from February 2025, which was also right-sided and was secondary to heart failure.  Patient is completely asymptomatic.  Pulmonology consulted and placed a right-sided chest tube on 11/14 and have applied lytics for management of loculated effusion -Pulmonology consultation for recommendations: pending today -Follow-up pleural fluid culture, pathology -Continue Ceftriaxone /Flagyl  Diabetes mellitus type 2 Uncontrolled with hyperglycemia based on last hemoglobin A1c of 8.3%. -SSI  CAD S/p CABG x4.  Aortic atherosclerosis Noted on CT imaging.  Aortic aneurysm Stable.  Chronic HFpEF Currently stable.  Peripheral vascular disease Noted. Not on medication therapy.  Ambulatory dysfunction Noted. -PT/OT recommendations: SNF  Left foot wound Noted.  Generalized anxiety disorder Depression -Continue Paxil  and  Remeron   Cognitive dysfunction Noted. -Continue Seroquel    DVT prophylaxis: SCDs Code Status:   Code Status: Full Code Family Communication: None at bedside Disposition  Plan: Discharge pending ongoing specialist recommendations, stable hemoglobin, removal of chest tube   Consultants:  Gastroenterology Urology PCCM  Procedures:  None  Antimicrobials: Ceftriaxone     Subjective: No concerns this morning.  Objective: BP (!) 143/73 (BP Location: Left Arm)   Pulse 64   Temp 98.9 F (37.2 C)   Resp 16   Ht 6' 3 (1.905 m)   Wt 68 kg   SpO2 97%   BMI 18.75 kg/m   Examination:  General exam: Appears calm and comfortable. Respiratory system: Clear to auscultation. Respiratory effort normal. Cardiovascular system: S1 & S2 heard, RRR. No murmur. Gastrointestinal system: Abdomen is nondistended, soft and nontender. Normal bowel sounds heard. Central nervous system: Alert and oriented to person place and time. Musculoskeletal: No edema. No calf tenderness Psychiatry: Short-term memory is impaired. Mood & affect appropriate.    Data Reviewed: I have personally reviewed following labs and imaging studies  CBC Lab Results  Component Value Date   WBC 6.2 11/16/2023   RBC 3.04 (L) 11/16/2023   HGB 8.4 (L) 11/16/2023   HCT 26.3 (L) 11/16/2023   MCV 86.5 11/16/2023   MCH 27.6 11/16/2023   PLT 236 11/16/2023   MCHC 31.9 11/16/2023   RDW 16.3 (H) 11/16/2023   LYMPHSABS 0.6 (L) 11/12/2023   MONOABS 0.7 11/12/2023   EOSABS 0.2 11/12/2023   BASOSABS 0.0 11/12/2023     Last metabolic panel Lab Results  Component Value Date   NA 139 11/15/2023   K 4.6 11/15/2023   CL 105 11/15/2023   CO2 24 11/15/2023   BUN 29 (H) 11/15/2023   CREATININE 0.70 11/15/2023   GLUCOSE 169 (H) 11/15/2023   GFRNONAA >60 11/15/2023   GFRAA >60 06/09/2018   CALCIUM  7.9 (L) 11/15/2023   PHOS 3.9 02/07/2023   PROT 6.1 (L) 11/16/2023   ALBUMIN 2.1 (L) 11/16/2023   LABGLOB 3.8  08/26/2023   BILITOT 0.4 11/14/2023   ALKPHOS 132 (H) 11/14/2023   AST 17 11/14/2023   ALT 26 11/14/2023   ANIONGAP 10 11/15/2023    GFR: Estimated Creatinine Clearance: 82.6 mL/min (by C-G formula based on SCr of 0.7 mg/dL).  Recent Results (from the past 240 hours)  Urine Culture     Status: Abnormal   Collection Time: 11/13/23  1:10 AM   Specimen: Urine, Catheterized  Result Value Ref Range Status   Specimen Description URINE, CATHETERIZED  Final   Special Requests   Final    NONE Performed at [facility], [address] [place], [place] [address]    Culture 20,000 COLONIES/mL STAPHYLOCOCCUS HAEMOLYTICUS (A)  Final   Report Status 11/15/2023 FINAL  Final   Organism ID, Bacteria STAPHYLOCOCCUS HAEMOLYTICUS (A)  Final      Susceptibility   Staphylococcus haemolyticus - MIC*    CIPROFLOXACIN >=8 RESISTANT Resistant     GENTAMICIN <=0.5 SENSITIVE Sensitive     NITROFURANTOIN <=16 SENSITIVE Sensitive     OXACILLIN >=4 RESISTANT Resistant     TETRACYCLINE <=1 SENSITIVE Sensitive     VANCOMYCIN  <=0.5 SENSITIVE Sensitive     TRIMETH /SULFA  <=10 SENSITIVE Sensitive     RIFAMPIN <=0.5 SENSITIVE Sensitive     Inducible Clindamycin POSITIVE Resistant     * 20,000 COLONIES/mL STAPHYLOCOCCUS HAEMOLYTICUS  MRSA Next Gen by PCR, Nasal     Status: None   Collection Time: 11/13/23  4:02 PM   Specimen: Nasal Mucosa; Nasal Swab  Result Value Ref Range Status   MRSA by PCR Next Gen NOT  DETECTED NOT DETECTED Final    Comment: (NOTE) The GeneXpert MRSA Assay (FDA approved for NASAL specimens only), is one component of a comprehensive MRSA colonization surveillance program. It is not intended to diagnose MRSA infection nor to guide or monitor treatment for MRSA infections. Test performance is not FDA approved in patients less than 63 years old. Performed at [facility], [address], [place], [place] [address]   Body fluid culture w Gram Stain     Status: None  (Preliminary result)   Collection Time: 11/15/23  5:29 PM   Specimen: Pleural Fluid  Result Value Ref Range Status   Specimen Description PLEURAL  Final   Special Requests RIGHT  Final   Gram Stain   Final    NO WBC SEEN NO ORGANISMS SEEN Performed at [facility], [address] [place], [place] [address]    Culture PENDING  Incomplete   Report Status PENDING  Incomplete      Radiology Studies: DG Chest Port 1 View Result Date: 11/16/2023 CLINICAL DATA:  Chest tube EXAM: PORTABLE CHEST 1 VIEW COMPARISON:  11/15/2023 FINDINGS: Basilar right-sided pneumothorax is similar to prior with right pleural drain still in place. Patchy airspace disease in the left lower lung is new in the interval with small left pleural effusion. The cardio pericardial silhouette is enlarged. Telemetry leads overlie the chest. IMPRESSION: 1. Stable basilar right-sided pneumothorax with right pleural drain still in place. 2. New patchy airspace disease in the left lower lung with small left pleural effusion. Electronically Signed   By: Camellia Candle M.D.   On: 11/16/2023 07:30   DG Chest Port 1 View Result Date: 11/15/2023 CLINICAL DATA:  Chest tube placement EXAM: PORTABLE CHEST 1 VIEW COMPARISON:  10/23/2023, CT 11/13/2023 FINDINGS: Incompletely lung bases. Interim placement of right basilar chest tube with pigtail in the region of the right cardio phrenic sulcus. Small left-sided slightly loculated pleural effusion. Small residual loculated right pleural effusion, now with gas in the pleural space. Multiple skin fold artifact over the left chest. Atelectasis or pneumonia in the lower lobes. Normal cardiac size with aortic atherosclerosis. Post sternotomy changes. IMPRESSION: 1. Interim placement of right basilar chest tube with pigtail projecting over the right cardio phrenic sulcus region. Small residual loculated right pleural effusion, now with moderate gas in the right basilar pleural space. 2. Small  left-sided slightly loculated pleural effusion. Atelectasis or pneumonia in the lower lobes. Electronically Signed   By: Luke Bun M.D.   On: 11/15/2023 18:10      LOS: 3 days    Elgin Lam, MD [facility] 11/16/2023, 11:51 AM   If 7PM-7AM, please contact night-coverage [email or web address]

## 2023-11-16 NOTE — Progress Notes (Signed)
 Mobility Specialist Progress Note:   11/16/23 1011  Mobility  Activity Ambulated with assistance (In room)  Level of Assistance Contact guard assist, steadying assist  Assistive Device Front wheel walker  Distance Ambulated (ft) 12 ft  Activity Response Tolerated well  Mobility Referral Yes  Mobility visit 1 Mobility  Mobility Specialist Start Time (ACUTE ONLY) [id number]  Mobility Specialist Stop Time (ACUTE ONLY) 1011  Mobility Specialist Time Calculation (min) (ACUTE ONLY) 15 min   Received pt in bed and hesitant, but agreeable to mobility. Pt required MinG for safety. C/o RLE pain and fatigue, otherwise tolerated well. Left pt in bed with alarm on. Personal belongings and call light within reach. All needs met.  Lavanda Pollack Mobility Specialist  Please contact via [employer] or  Rehab Office [phone]

## 2023-11-16 NOTE — Progress Notes (Signed)
   Subjective/Chief Complaint:  1 - Gross Hematuria / Prostate Source Bleeding - long h/o LUTS managed at [facility]. Prostate 30gm (NOT enlarged) in imaging this admission. 3 way catheter placed 11/14. Finasteride started 11/15.  2 - Severe Erectile Dysfunction  - s/p penile prosthesis elswhere. Diabetes and major vascular disease.  Today  XXX is stable.    Objective: Vital signs in last 24 hours: Temp:  [98.3 F (36.8 C)-98.7 F (37.1 C)] 98.6 F (37 C) (11/15 0509) Pulse Rate:  [59-94] 63 (11/15 0509) Resp:  [15-18] 15 (11/15 0509) BP: (135-167)/(73-76) 149/73 (11/15 0509) SpO2:  [90 %-100 %] 100 % (11/15 0509) Last BM Date : 11/12/23  Intake/Output from previous day: 11/14 0701 - 11/15 0700 In: 503.4 [P.O.:100; IV Piggyback:403.4] Out: 1304 [Urine:7175; Chest Tube:1520] Intake/Output this shift: Total I/O In: 403.4 [IV Piggyback:403.4] Out: 6220 [Urine:4700; Chest Tube:1520]  {physical zkjf:[id number]}  Lab Results:  Recent Labs    11/15/23 0219 11/16/23 0419  WBC 4.6 6.2  HGB 7.8* 8.4*  HCT 24.6* 26.3*  PLT 199 236   BMET Recent Labs    11/14/23 0512 11/15/23 0219  NA 138 139  K 4.3 4.6  CL 107 105  CO2 24 24  GLUCOSE 208* 169*  BUN 52* 29*  CREATININE 0.79 0.70  CALCIUM  8.0* 7.9*   PT/INR No results for input(s): LABPROT, INR in the last 72 hours. ABG No results for input(s): PHART, HCO3 in the last 72 hours.  Invalid input(s): PCO2, PO2  Studies/Results: DG Chest Port 1 View Result Date: 11/15/2023 CLINICAL DATA:  Chest tube placement EXAM: PORTABLE CHEST 1 VIEW COMPARISON:  10/23/2023, CT 11/13/2023 FINDINGS: Incompletely lung bases. Interim placement of right basilar chest tube with pigtail in the region of the right cardio phrenic sulcus. Small left-sided slightly loculated pleural effusion. Small residual loculated right pleural effusion, now with gas in the pleural space. Multiple skin fold artifact over the left  chest. Atelectasis or pneumonia in the lower lobes. Normal cardiac size with aortic atherosclerosis. Post sternotomy changes. IMPRESSION: 1. Interim placement of right basilar chest tube with pigtail projecting over the right cardio phrenic sulcus region. Small residual loculated right pleural effusion, now with moderate gas in the right basilar pleural space. 2. Small left-sided slightly loculated pleural effusion. Atelectasis or pneumonia in the lower lobes. Electronically Signed   By: Luke Bun M.D.   On: 11/15/2023 18:10    Anti-infectives: Anti-infectives (From admission, onward)    Start     Dose/Rate Route Frequency Ordered Stop   11/14/23 1630  metroNIDAZOLE (FLAGYL) IVPB 500 mg        500 mg 100 mL/hr over 60 Minutes Intravenous Every 8 hours 11/14/23 1534     11/14/23 0200  cefTRIAXone  (ROCEPHIN ) 1 g in sodium chloride  0.9 % 100 mL IVPB        1 g 200 mL/hr over 30 Minutes Intravenous Every 24 hours 11/13/23 0821     11/13/23 0215  cefTRIAXone  (ROCEPHIN ) 1 g in sodium chloride  0.9 % 100 mL IVPB        1 g 200 mL/hr over 30 Minutes Intravenous  Once 11/13/23 0208 11/13/23 0411       Assessment/Plan:     Ricardo KATHEE Alvaro Mickey. 11/16/2023

## 2023-11-17 ENCOUNTER — Inpatient Hospital Stay (HOSPITAL_COMMUNITY)

## 2023-11-17 DIAGNOSIS — J869 Pyothorax without fistula: Secondary | ICD-10-CM | POA: Diagnosis not present

## 2023-11-17 DIAGNOSIS — R310 Gross hematuria: Secondary | ICD-10-CM | POA: Diagnosis not present

## 2023-11-17 DIAGNOSIS — D509 Iron deficiency anemia, unspecified: Secondary | ICD-10-CM | POA: Diagnosis not present

## 2023-11-17 DIAGNOSIS — R195 Other fecal abnormalities: Secondary | ICD-10-CM | POA: Diagnosis not present

## 2023-11-17 DIAGNOSIS — I509 Heart failure, unspecified: Secondary | ICD-10-CM | POA: Diagnosis not present

## 2023-11-17 DIAGNOSIS — J948 Other specified pleural conditions: Secondary | ICD-10-CM | POA: Diagnosis not present

## 2023-11-17 LAB — CBC
HCT: 25.5 % — ABNORMAL LOW (ref 39.0–52.0)
Hemoglobin: 8 g/dL — ABNORMAL LOW (ref 13.0–17.0)
MCH: 27.5 pg (ref 26.0–34.0)
MCHC: 31.4 g/dL (ref 30.0–36.0)
MCV: 87.6 fL (ref 80.0–100.0)
Platelets: 209 K/uL (ref 150–400)
RBC: 2.91 MIL/uL — ABNORMAL LOW (ref 4.22–5.81)
RDW: 16.4 % — ABNORMAL HIGH (ref 11.5–15.5)
WBC: 4.9 K/uL (ref 4.0–10.5)
nRBC: 0 % (ref 0.0–0.2)

## 2023-11-17 LAB — GLUCOSE, CAPILLARY
Glucose-Capillary: 208 mg/dL — ABNORMAL HIGH (ref 70–99)
Glucose-Capillary: 184 mg/dL — ABNORMAL HIGH (ref 70–99)
Glucose-Capillary: 206 mg/dL — ABNORMAL HIGH (ref 70–99)
Glucose-Capillary: 214 mg/dL — ABNORMAL HIGH (ref 70–99)
Glucose-Capillary: 170 mg/dL — ABNORMAL HIGH (ref 70–99)

## 2023-11-17 LAB — BASIC METABOLIC PANEL WITH GFR
Anion gap: 7 (ref 5–15)
BUN: 18 mg/dL (ref 8–23)
CO2: 26 mmol/L (ref 22–32)
Calcium: 7.6 mg/dL — ABNORMAL LOW (ref 8.9–10.3)
Chloride: 104 mmol/L (ref 98–111)
Creatinine, Ser: 0.74 mg/dL (ref 0.61–1.24)
GFR, Estimated: >60 mL/min (ref >60)
Glucose, Bld: 210 mg/dL — ABNORMAL HIGH (ref 70–99)
Potassium: 4.1 mmol/L (ref 3.5–5.1)
Sodium: 137 mmol/L (ref 135–145)

## 2023-11-17 NOTE — Progress Notes (Incomplete)
 NAME:  [name] [name], MRN:  [id number], DOB:  07/12/1955, LOS: 4 ADMISSION DATE:  11/12/2023, CONSULTATION DATE: 11/14/23  REFERRING MD: Briana CHIEF COMPLAINT: Right Pleural Effusion   History of Present Illness:  Pt is a 68 yr male significant hx of afib RVR on eliquis , HTN, CAD, CABGx4, aortic aneursym s/p repair, CVA, DM2, heart failure with preserved EF, and HLD who presented to ED with hematuria, AKI, and anemia. [facility] notified for admission and urology consulted in setting of hematuria. CT abdomen/pelvis obtained showing significant right pleural collection with air-fluid levels concern for infection/empyema. Patient afebrile and showing no signs of elevated WBC at this time. PCCM consulted for possible thoracentesis vs chest tube placement in setting of empyema   Pertinent  Medical History   Past Medical History:  Diagnosis Date   Acute urinary retention 01/04/2022   Altered mental status 08/18/2023   Anxiety    Arthritis    Cataract    CHF (congestive heart failure) (HCC)    Constipation    pt states goes EOD- hard stools    Coronary artery disease    Depression    Diabetes mellitus without complication (HCC)    Diabetic retinopathy (HCC)    DKA (diabetic ketoacidoses)    Hyperlipidemia    Hypertension    off meds   Hypertensive retinopathy    Lesion of ulnar nerve, left upper limb 07/12/2017   Non-intractable vomiting without nausea 06/10/2018   Postherpetic neuralgia 08/11/2021   Speculative diagnosis   Psoriasis    Tubular adenoma of colon 03/2019   UTI (urinary tract infection) 01/05/2022     Significant Hospital Events: Including procedures, antibiotic start and stop dates in addition to other pertinent events   11/13 Pulm Consult- for empyema on CT abdomen/pelvis   Interim History / Subjective:  No complaints of SOB or chest pain  Afebrile Hgb 7.8> 8.4, WBC 6.2 CT output 1.52L since placement 11/14  Objective    Blood pressure 121/70, pulse 60,  temperature 98.3 F (36.8 C), temperature source Oral, resp. rate 18, height 6' 3 (1.905 m), weight 68 kg, SpO2 96%.        Intake/Output Summary (Last 24 hours) at 11/17/2023 9176 Last data filed at 11/17/2023 0700 Gross per 24 hour  Intake 3100 ml  Output 87649 ml  Net -9250 ml   Filed Weights   11/12/23 2154  Weight: 68 kg    Examination: General:  older male lying in bed watching TV in NAD Neuro: Awake, alert, appropriate  CV: rr PULM:  non labored, clear anteriorly, diminished bibasilar, R pigtail CT to 20 sxn, no airleak draining yellowish clear fluid GI: soft, bs+, foley Extremities: warm/dry, no LE edema   No imaging today  Resolved problem list   Assessment and Plan    Gross Hematuria  Possible UTI Anemia IDA AKI PAF CAD DM Aortic atherosclerosis  Chronic HFpEF PVD Left Foot Wound GAD Depression  Cognitive Dysfunction    Pulm problem list Right hydropneumothorax Hx of right thoracentesis in setting of CHF 2/25 - pleural fluid exudate by lights criteria based on LDH alone., GS neg, lymphocytic predominant so suspect fluid had been present for some time. Suspect degree of trapped lung and would also expect fluid would likely re-accumulate to fill space after removal. Path noting concern for hemothorax (? Traumatic)   - CT abdomen/pelvis obtained showing significant right pleural collection with air-fluid levels concern for infection/empyema - s/p CT placement and pleural lytics 11/14  Plan cont  CT to 20 cm sxn, follow drainage CXR in am hold on further pleural lytics for now F/u pending culture, cytology and fungal culture  Pull CT when output < 200 ml/24 x 2 Cont diuretics Abx per primary

## 2023-11-17 NOTE — Progress Notes (Signed)
 Mobility Specialist Progress Note:   11/17/23 1250  Mobility  Activity Ambulated with assistance (In room)  Level of Assistance Contact guard assist, steadying assist  Assistive Device Front wheel walker  Distance Ambulated (ft) 12 ft  Activity Response Tolerated well  Mobility Referral Yes  Mobility visit 1 Mobility  Mobility Specialist Start Time (ACUTE ONLY) 1231  Mobility Specialist Stop Time (ACUTE ONLY) 1250  Mobility Specialist Time Calculation (min) (ACUTE ONLY) 19 min   Received pt in bed and agreeable to mobility. Pt required MinG for safety. No c/o. Returned to bed without fault. Left pt in bed with alarm on. Personal belongings and call light within reach. All needs met.  Lavanda Pollack Mobility Specialist  Please contact via [employer] or  Rehab Office [phone]

## 2023-11-17 NOTE — Plan of Care (Signed)
  Problem: Education: Goal: Knowledge of General Education information will improve Description: Including pain rating scale, medication(s)/side effects and non-pharmacologic comfort measures Outcome: Progressing   Problem: Health Behavior/Discharge Planning: Goal: Ability to manage health-related needs will improve Outcome: Progressing   Problem: Clinical Measurements: Goal: Ability to maintain clinical measurements within normal limits will improve Outcome: Progressing Goal: Will remain free from infection Outcome: Progressing Goal: Diagnostic test results will improve Outcome: Progressing Goal: Respiratory complications will improve Outcome: Progressing Goal: Cardiovascular complication will be avoided Outcome: Progressing   Problem: Activity: Goal: Risk for activity intolerance will decrease Outcome: Progressing   Problem: Nutrition: Goal: Adequate nutrition will be maintained Outcome: Progressing   Problem: Coping: Goal: Level of anxiety will decrease Outcome: Progressing   Problem: Elimination: Goal: Will not experience complications related to bowel motility Outcome: Progressing Goal: Will not experience complications related to urinary retention Outcome: Progressing   Problem: Pain Managment: Goal: General experience of comfort will improve and/or be controlled Outcome: Progressing   Problem: Coping: Goal: Level of anxiety will decrease Outcome: Progressing   Problem: Elimination: Goal: Will not experience complications related to bowel motility Outcome: Progressing Goal: Will not experience complications related to urinary retention Outcome: Progressing   Problem: Pain Managment: Goal: General experience of comfort will improve and/or be controlled Outcome: Progressing   Problem: Skin Integrity: Goal: Risk for impaired skin integrity will decrease Outcome: Progressing   Problem: Safety: Goal: Ability to remain free from injury will  improve Outcome: Progressing   Problem: Education: Goal: Ability to describe self-care measures that may prevent or decrease complications (Diabetes Survival Skills Education) will improve Outcome: Progressing Goal: Individualized Educational Video(s) Outcome: Progressing

## 2023-11-17 NOTE — Progress Notes (Signed)
 PROGRESS NOTE    [name] [name]  FMW:[id number] DOB: 1955/10/26 DOA: 11/12/2023 PCP: [name] HERO, PA-C   Brief Narrative:  [name] [name] is a 68 y.o. male with a history of atrial fibrillation with slow ventricular response, CAD, CABG x 4, aortic aneurysm s/p repair, CVA, diabetes mellitus type 2, heart failure with preserved EF.  Patient presented secondary to abnormal metabolic panel and while in the emergency department found to have evidence of gross hematuria, anemia and AKI.  Urology consulted.  Patient started IV fluids.  During workup, patient was Hemoccult positive, GI consulted.  Hemoglobin continued to drift down requiring 1 unit PRBC via transfusion.  CT imaging on admission was significant for right pleural effusion concerning for possible empyema.  Pulmonology consulted for chest tube placement which was performed on 11/14. Now Chest Tube is to waterseal and Pulmonary now considering Chest tube removal on 11/17.   Assessment and Plan:  Gross hematuria: Possibly related to foley catheter trauma and complicated by Eliquis  use. Urology consulted. CT abdomen/pelvis significant for normal kidneys without hydronephrosis, nonobstructing stone versus vascular calcification of the upper pole left kidney, no renal mass.  Hematuria catheter was declined by patient.  Per urology, possible hemorrhagic cystitis. Hematuria catheter placed and CBI started on 11/14. Urine culture positive for 20,000 colonies/ml of staphylococcus hemolyticus. -Urology recommendations: Treat for possible UTI, continue Foley with continued attempts for replacement of catheter without hematuria catheter, trend H&H as below; urology team is recommending continuing catheter for this 1 to 2 days and then consider a trial of void as his GU bleeding subsided.  They also recommended continuing finasteride at discharge to try and reduce the prostate source of bleeding.   Possible UTI: Urine culture obtained.  Patient started empirically on Ceftriaxone . Urinalysis with rare bacteruria, pyuria and is nitrite positive; significant squamous cells. Urine culture positive for 20,000 colonies/ml of staphylococcus hemolyticus. Started Bactrim  in addition to Abx as below   Acute blood loss anemia / Chronic iron  deficiency anemia: Baseline hemoglobin appears to be around 8-9.  Hemoglobin of 7.5 on admission with downtrend to 6.7 likely related to patient's gross hematuria in addition to heme positive stool.  1 unit of PRBC ordered on 11/13 with posttransfusion hemoglobin up to 8.5.  Hemoglobin/Hct now stable at current trend:  Recent Labs  Lab 11/12/23 2202 11/12/23 2243 11/14/23 0512 11/14/23 1758 11/15/23 0219 11/16/23 0419 11/17/23 0651  HGB 7.5* 7.8* 6.7* 8.5* 7.8* 8.4* 8.0*  HCT 24.8* 23.0* 21.5* 26.8* 24.6* 26.3* 25.5*  MCV 89.2  --  87.0  --  87.2 86.5 87.6  -CTM for S/Sx of Bleeding; repeat CBC in the AM   AKI: Present on admission with creatinine of 1.48 and peak of 1.50. Resolved. -BUN/Cr Trend: Recent Labs  Lab 11/02/23 0922 11/03/23 0934 11/12/23 2202 11/12/23 2243 11/14/23 0512 11/15/23 0219 11/17/23 0651  BUN 40* 33* 94* 93* 52* 29* 18  CREATININE 1.09 0.84 1.48* 1.50* 0.79 0.70 0.74  -Avoid Nephrotoxic Medications, Contrast Dyes, Hypotension and Dehydration to Ensure Adequate Renal Perfusion and will need to Renally Adjust Meds. CTM and Trend Renal Function carefully and repeat CMP in the AM   Heme positive stool: No history of hematochezia or melena.  BUN significantly elevated on admission, but patient also has evidence of dehydration.  Complicated by Eliquis  use. GI consulted and have recommended inpatient EGD/Colonoscopy. Patient continues to adamantly decline endoscopy procedures and so now Diet being advanced and now on Soft diet.    Paroxysmal atrial  fibrillation with slow ventricular response: Patient is not on rate control medication secondary to slow ventricular response.   He is managed on Eliquis  secondary to a CHA2DS2-VASc of 4.  Eliquis  held secondary to hematuria and associated acute anemia.   Right pleural effusion: Noted on CT imaging with evidence of moderate-sized collection.  Concern for possible empyema secondary to gas seen on imaging.  Last thoracentesis was from February 2025, which was also right-sided and was secondary to heart failure.  Patient is completely asymptomatic.  Pulmonology consulted and placed a right-sided chest tube on 11/14 and have applied lytics for management of loculated effusion -Pulmonology consultation for recommendations: Chest Tube to Waterseal with consideration of CT removal tomorrow if Cx Negative -Follow-up pleural fluid culture, pathology; Overnight Provider received a call overnight regarding her pleural fluid in the body fluid cell count with differential results was reading as a typical number of immature mononuclear cells.  This was sent to pathology and flow cytometry recommended but and available in house -Continue Ceftriaxone /Flagyl for now and appreciate further evaluation and recommendation by the pulmonary team   Diabetes mellitus type 2: Uncontrolled with hyperglycemia based on last hemoglobin A1c of 8.3%. C/w Very Sensitive Novolog  SSI TIDwm   CAD: S/p CABG x4. Holding ASA given Bleeding    Aortic atherosclerosis: Noted on CT imaging.   Aortic aneurysm: Stable.   Chronic HFpEF: Currently stable. Strict I's and O's and Daily Weights. CTM for S/Sx of Volume Overload   Peripheral vascular disease Noted. Not on medication therapy.   Ambulatory dysfunction: Noted. PT/OT recommendations: SNF   Left foot wound: Noted. Will consult WOC Nurse   Generalized Anxiety Disorder and Depression: C/w Paroxetine  20 mg po daily, Mirtazepine 15 mg po at bedtime; Also on Quetiapine  25 mg po qHS  Cognitive dysfunction: Noted. Continue Quetiapine  25 mg po Daily    DVT prophylaxis: SCDs Start: 11/13/23 0733    Code Status:  Full Code Family Communication: No family present @ bedside  Disposition Plan:  Level of care: Telemetry Status is: Inpatient Remains inpatient appropriate because: Needs further clinical improvement and clearance by the specialists   Consultants:  Pulmonary Urology Gastroenterology  Procedures:  As delineated as above   Antimicrobials:  Anti-infectives (From admission, onward)    Start     Dose/Rate Route Frequency Ordered Stop   11/16/23 1315  sulfamethoxazole -trimethoprim  (BACTRIM  DS) 800-160 MG per tablet 1 tablet        1 tablet Oral Every 12 hours 11/16/23 1202     11/14/23 1630  metroNIDAZOLE (FLAGYL) IVPB 500 mg        500 mg 100 mL/hr over 60 Minutes Intravenous Every 8 hours 11/14/23 1534     11/14/23 0200  cefTRIAXone  (ROCEPHIN ) 1 g in sodium chloride  0.9 % 100 mL IVPB        1 g 200 mL/hr over 30 Minutes Intravenous Every 24 hours 11/13/23 0821     11/13/23 0215  cefTRIAXone  (ROCEPHIN ) 1 g in sodium chloride  0.9 % 100 mL IVPB        1 g 200 mL/hr over 30 Minutes Intravenous  Once 11/13/23 0208 11/13/23 0411       Subjective: Seen and examined at bedside and is resting.  Had no complaints and no nausea or vomiting.  Diet is being advanced as he does not want to eat the same soup. No nausea or vomiting. No other concerns of complaints at this time.   Objective: Vitals:   11/17/23 0542 11/17/23 9256  11/17/23 1427 11/17/23 2054  BP: 121/70 (!) 166/70 137/76 (!) 159/74  Pulse: (!) 50 60 62 64  Resp: 15 18 16 17   Temp: 98.3 F (36.8 C) 98.3 F (36.8 C) 98.3 F (36.8 C) 98.5 F (36.9 C)  TempSrc:  Oral  Oral  SpO2:  96% 100% 96%  Weight:      Height:        Intake/Output Summary (Last 24 hours) at 11/17/2023 2111 Last data filed at 11/17/2023 1805 Gross per 24 hour  Intake 360 ml  Output 6650 ml  Net -6290 ml   Filed Weights   11/12/23 2154  Weight: 68 kg   Examination: Physical Exam:  Constitutional: Chronically ill-appearing Caucasian male in  no acute distress Respiratory: Diminished to auscultation bilaterally with some coarse breath sounds, no wheezing, rales, rhonchi or crackles. Normal respiratory effort and patient is not tachypenic. No accessory muscle use.  Unlabored breathing and has a right-sided chest tube in place Cardiovascular: RRR, no murmurs / rubs / gallops. S1 and S2 auscultated. No extremity edema. 2+ pedal pulses. No carotid bruits.  Abdomen: Soft, non-tender, non-distended. Bowel sounds positive.  GU: Deferred. Musculoskeletal: No clubbing / cyanosis of digits/nails. No joint deformity upper and lower extremities.  Skin: No rashes, lesions, ulcers on a limited evaluation. No induration; Warm and dry.  Neurologic: CN 2-12 grossly intact with no focal deficits. Romberg sign and cerebellar reflexes not assessed.  Psychiatric: He is awake and alert and appears calm  Data Reviewed: I have personally reviewed following labs and imaging studies  CBC: Recent Labs  Lab 11/12/23 2202 11/12/23 2243 11/14/23 0512 11/14/23 1758 11/15/23 0219 11/16/23 0419 11/17/23 0651  WBC 6.4  --  4.7  --  4.6 6.2 4.9  NEUTROABS 4.9  --   --   --   --   --   --   HGB 7.5*   < > 6.7* 8.5* 7.8* 8.4* 8.0*  HCT 24.8*   < > 21.5* 26.8* 24.6* 26.3* 25.5*  MCV 89.2  --  87.0  --  87.2 86.5 87.6  PLT 244  --  200  --  199 236 209   < > = values in this interval not displayed.   Basic Metabolic Panel: Recent Labs  Lab 11/12/23 2202 11/12/23 2243 11/14/23 0512 11/15/23 0219 11/17/23 0651  NA 136 137 138 139 137  K 4.9 5.0 4.3 4.6 4.1  CL 100 100 107 105 104  CO2 27  --  24 24 26   GLUCOSE 235* 224* 208* 169* 210*  BUN 94* 93* 52* 29* 18  CREATININE 1.48* 1.50* 0.79 0.70 0.74  CALCIUM  8.4*  --  8.0* 7.9* 7.6*   GFR: Estimated Creatinine Clearance: 82.6 mL/min (by C-G formula based on SCr of 0.74 mg/dL). Liver Function Tests: Recent Labs  Lab 11/12/23 2202 11/14/23 0512 11/16/23 0419  AST 27 17  --   ALT 32 26  --    ALKPHOS 177* 132*  --   BILITOT <0.2 0.4  --   PROT 6.7 5.9* 6.1*  ALBUMIN 2.2* 1.9* 2.1*   No results for input(s): LIPASE, AMYLASE in the last 168 hours. No results for input(s): AMMONIA in the last 168 hours. Coagulation Profile: Recent Labs  Lab 11/12/23 2202  INR 1.6*   Cardiac Enzymes: No results for input(s): CKTOTAL, CKMB, CKMBINDEX, TROPONINI in the last 168 hours. BNP (last 3 results) Recent Labs    03/01/23 1157 08/18/23 1610  PROBNP 399.0* 3,796.0*  HbA1C: Recent Labs    11/15/23 0219  HGBA1C 8.3*   CBG: Recent Labs  Lab 11/16/23 2131 11/17/23 0541 11/17/23 0810 11/17/23 1143 11/17/23 1619  GLUCAP 206* 208* 184* 206* 214*   Lipid Profile: No results for input(s): CHOL, HDL, LDLCALC, TRIG, CHOLHDL, LDLDIRECT in the last 72 hours. Thyroid  Function Tests: No results for input(s): TSH, T4TOTAL, FREET4, T3FREE, THYROIDAB in the last 72 hours. Anemia Panel: No results for input(s): VITAMINB12, FOLATE, FERRITIN, TIBC, IRON , RETICCTPCT in the last 72 hours. Sepsis Labs: No results for input(s): PROCALCITON, LATICACIDVEN in the last 168 hours.  Recent Results (from the past 240 hours)  Urine Culture     Status: Abnormal   Collection Time: 11/13/23  1:10 AM   Specimen: Urine, Catheterized  Result Value Ref Range Status   Specimen Description URINE, CATHETERIZED  Final   Special Requests   Final    NONE Performed at [name], [address], [place], [place] [address]    Culture 20,000 COLONIES/mL STAPHYLOCOCCUS HAEMOLYTICUS (A)  Final   Report Status 11/15/2023 FINAL  Final   Organism ID, Bacteria STAPHYLOCOCCUS HAEMOLYTICUS (A)  Final      Susceptibility   Staphylococcus haemolyticus - MIC*    CIPROFLOXACIN >=8 RESISTANT Resistant     GENTAMICIN <=0.5 SENSITIVE Sensitive     NITROFURANTOIN <=16 SENSITIVE Sensitive     OXACILLIN >=4 RESISTANT Resistant     TETRACYCLINE <=1 SENSITIVE  Sensitive     VANCOMYCIN  <=0.5 SENSITIVE Sensitive     TRIMETH /SULFA  <=10 SENSITIVE Sensitive     RIFAMPIN <=0.5 SENSITIVE Sensitive     Inducible Clindamycin POSITIVE Resistant     * 20,000 COLONIES/mL STAPHYLOCOCCUS HAEMOLYTICUS  MRSA Next Gen by PCR, Nasal     Status: None   Collection Time: 11/13/23  4:02 PM   Specimen: Nasal Mucosa; Nasal Swab  Result Value Ref Range Status   MRSA by PCR Next Gen NOT DETECTED NOT DETECTED Final    Comment: (NOTE) The GeneXpert MRSA Assay (FDA approved for NASAL specimens only), is one component of a comprehensive MRSA colonization surveillance program. It is not intended to diagnose MRSA infection nor to guide or monitor treatment for MRSA infections. Test performance is not FDA approved in patients less than 68 years old. Performed at [facility], [address], [place], [place] [address]   Body fluid culture w Gram Stain     Status: None (Preliminary result)   Collection Time: 11/15/23  5:29 PM   Specimen: Pleural Fluid  Result Value Ref Range Status   Specimen Description PLEURAL  Final   Special Requests RIGHT  Final   Gram Stain NO WBC SEEN NO ORGANISMS SEEN   Final   Culture   Final    NO GROWTH 2 DAYS Performed at [facility], [address] [place], [place] [address]    Report Status PENDING  Incomplete    Radiology Studies: DG Chest Port 1 View Result Date: 11/17/2023 CLINICAL DATA:  Follow-up pleural effusion and pneumothorax. EXAM: PORTABLE CHEST 1 VIEW COMPARISON:  Radiographs 11/16/2023 and 11/15/2023. FINDINGS: 0702 hours. Right basilar pleural drain appears unchanged in position. A loculated pneumothorax at the right costophrenic angle appears stable. Interval mildly improved aeration of the left lung base spread the heart size and mediastinal contours are stable post median sternotomy and CABG. No acute osseous findings are demonstrated. IMPRESSION: 1. Stable loculated pneumothorax at the right costophrenic  angle with pleural drain in place. 2.  Mildly improved aeration of the left lung base. Electronically Signed   By: Elsie Perone M.D.   On: 11/17/2023 11:20   DG Chest Port 1 View Result Date: 11/16/2023 CLINICAL DATA:  Chest tube EXAM: PORTABLE CHEST 1 VIEW COMPARISON:  11/15/2023 FINDINGS: Basilar right-sided pneumothorax is similar to prior with right pleural drain still in place. Patchy airspace disease in the left lower lung is new in the interval with small left pleural effusion. The cardio pericardial silhouette is enlarged. Telemetry leads overlie the chest. IMPRESSION: 1. Stable basilar right-sided pneumothorax with right pleural drain still in place. 2. New patchy airspace disease in the left lower lung with small left pleural effusion. Electronically Signed   By: Camellia Candle M.D.   On: 11/16/2023 07:30   Scheduled Meds:  Chlorhexidine  Gluconate Cloth  6 each Topical Daily   finasteride  5 mg Oral Daily   insulin  aspart  0-6 Units Subcutaneous TID WC   mirtazapine   15 mg Oral QHS   PARoxetine   20 mg Oral Daily   QUEtiapine   25 mg Oral QHS   sodium chloride  flush  10 mL Intrapleural Q8H   sulfamethoxazole -trimethoprim   1 tablet Oral Q12H   Continuous Infusions:  cefTRIAXone  (ROCEPHIN )  IV 1 g (11/17/23 0202)   metronidazole 500 mg (11/17/23 1804)   sodium chloride  irrigation      LOS: 4 days   Alejandro Marker, DO [facility] Available via Epic secure chat 7am-7pm After these hours, please refer to coverage provider listed on [email or web address] 11/17/2023, 9:11 PM

## 2023-11-17 NOTE — Progress Notes (Signed)
 NAME:  [name] [name], MRN:  [id number], DOB:  11-09-1955, LOS: 4 ADMISSION DATE:  11/12/2023, CONSULTATION DATE: 11/14/23  REFERRING MD: Briana CHIEF COMPLAINT: Right Pleural Effusion   History of Present Illness:  Pt is a 68 yr male significant hx of afib RVR on eliquis , HTN, CAD, CABGx4, aortic aneursym s/p repair, CVA, DM2, heart failure with preserved EF, and HLD who presented to ED with hematuria, AKI, and anemia. [facility] notified for admission and urology consulted in setting of hematuria. CT abdomen/pelvis obtained showing significant right pleural collection with air-fluid levels concern for infection/empyema. Patient afebrile and showing no signs of elevated WBC at this time. PCCM consulted for possible thoracentesis vs chest tube placement in setting of empyema   Pertinent  Medical History   Past Medical History:  Diagnosis Date   Acute urinary retention 01/04/2022   Altered mental status 08/18/2023   Anxiety    Arthritis    Cataract    CHF (congestive heart failure) (HCC)    Constipation    pt states goes EOD- hard stools    Coronary artery disease    Depression    Diabetes mellitus without complication (HCC)    Diabetic retinopathy (HCC)    DKA (diabetic ketoacidoses)    Hyperlipidemia    Hypertension    off meds   Hypertensive retinopathy    Lesion of ulnar nerve, left upper limb 07/12/2017   Non-intractable vomiting without nausea 06/10/2018   Postherpetic neuralgia 08/11/2021   Speculative diagnosis   Psoriasis    Tubular adenoma of colon 03/2019   UTI (urinary tract infection) 01/05/2022     Significant Hospital Events: Including procedures, antibiotic start and stop dates in addition to other pertinent events   11/13 Pulm Consult- for empyema on CT abdomen/pelvis   Interim History / Subjective:  Denies shortness of breath or chest pain On liquid diet and requesting full diet Chest tube output 200cc  Objective    Blood pressure (!) 166/70, pulse 60,  temperature 98.3 F (36.8 C), temperature source Oral, resp. rate 18, height 6' 3 (1.905 m), weight 68 kg, SpO2 96%.        Intake/Output Summary (Last 24 hours) at 11/17/2023 1239 Last data filed at 11/17/2023 0746 Gross per 24 hour  Intake 3120 ml  Output 89749 ml  Net -7130 ml   Filed Weights   11/12/23 2154  Weight: 68 kg   Physical Exam: General: Well-appearing, no acute distress HENT: [place], AT Eyes: EOMI, no scleral icterus Respiratory: Diminished but clear to auscultation bilaterally.  No crackles, wheezing or rales. Right chest tube in place with no air leak Cardiovascular: RRR, -M/R/G, no JVD Extremities:-Edema,-tenderness Neuro: AAO x4, CNII-XII grossly intact Psych: Normal mood, normal affect  Normal WBC  Resolved problem list   Assessment and Plan  Right hydropneumothorax -  likely chronic. Expect to reaccumulate after chest tube removal.  Plan to r/o empyema. Fluid  with WBC 109 predominantly lymphocytic Hx of right thoracentesis in setting of CHF 2/25  - CT abdomen/pelvis obtained showing significant right pleural collection with air-fluid levels concern for infection/empyema - s/p CT placement and pleural lytics 11/14 P:  - hold on further pleural lytics for now - Chest tube to waterseal. Updated nurse at bedside - Consider chest tube removal tomorrow if cultures negative - F/u culture. NGTD - follow cytology and fungal cx - abx per primary team - Hgb stable - pulmonary will continue to follow for CT management   Other Problem List managed  by Primary Gross Hematuria  Possible UTI Anemia IDA AKI PAF CAD DM Aortic atherosclerosis  Chronic HFpEF PVD Left Foot Wound GAD Depression  Cognitive Dysfunction    Slater Staff, M.D. [facility] Pulmonary/Critical Care Medicine 11/17/2023 12:39 PM   See Amion for personal pager For hours between 7 PM to 7 AM, please call Elink for urgent questions

## 2023-11-18 ENCOUNTER — Inpatient Hospital Stay (HOSPITAL_COMMUNITY)

## 2023-11-18 DIAGNOSIS — Z7901 Long term (current) use of anticoagulants: Secondary | ICD-10-CM | POA: Diagnosis not present

## 2023-11-18 DIAGNOSIS — J948 Other specified pleural conditions: Secondary | ICD-10-CM | POA: Diagnosis not present

## 2023-11-18 DIAGNOSIS — R310 Gross hematuria: Secondary | ICD-10-CM | POA: Diagnosis not present

## 2023-11-18 DIAGNOSIS — J869 Pyothorax without fistula: Secondary | ICD-10-CM | POA: Diagnosis not present

## 2023-11-18 DIAGNOSIS — J90 Pleural effusion, not elsewhere classified: Secondary | ICD-10-CM | POA: Diagnosis not present

## 2023-11-18 DIAGNOSIS — D509 Iron deficiency anemia, unspecified: Secondary | ICD-10-CM | POA: Diagnosis not present

## 2023-11-18 LAB — BODY FLUID CULTURE W GRAM STAIN
Culture: NO GROWTH
Gram Stain: NONE SEEN

## 2023-11-18 LAB — COMPREHENSIVE METABOLIC PANEL WITH GFR
ALT: 16 U/L (ref 0–44)
AST: 18 U/L (ref 15–41)
Albumin: 1.8 g/dL — ABNORMAL LOW (ref 3.5–5.0)
Alkaline Phosphatase: 136 U/L — ABNORMAL HIGH (ref 38–126)
Anion gap: 9 (ref 5–15)
BUN: 14 mg/dL (ref 8–23)
CO2: 25 mmol/L (ref 22–32)
Calcium: 7.5 mg/dL — ABNORMAL LOW (ref 8.9–10.3)
Chloride: 101 mmol/L (ref 98–111)
Creatinine, Ser: 0.7 mg/dL (ref 0.61–1.24)
GFR, Estimated: >60 mL/min (ref >60)
Glucose, Bld: 144 mg/dL — ABNORMAL HIGH (ref 70–99)
Potassium: 4.4 mmol/L (ref 3.5–5.1)
Sodium: 135 mmol/L (ref 135–145)
Total Bilirubin: 0.5 mg/dL (ref 0.0–1.2)
Total Protein: 5.6 g/dL — ABNORMAL LOW (ref 6.5–8.1)

## 2023-11-18 LAB — CBC WITH DIFFERENTIAL/PLATELET
Abs Immature Granulocytes: 0.03 K/uL (ref 0.00–0.07)
Basophils Absolute: 0.1 K/uL (ref 0.0–0.1)
Basophils Relative: 1 %
Eosinophils Absolute: 0.2 K/uL (ref 0.0–0.5)
Eosinophils Relative: 4 %
HCT: 25.3 % — ABNORMAL LOW (ref 39.0–52.0)
Hemoglobin: 8.1 g/dL — ABNORMAL LOW (ref 13.0–17.0)
Immature Granulocytes: 1 %
Lymphocytes Relative: 14 %
Lymphs Abs: 0.9 K/uL (ref 0.7–4.0)
MCH: 27.7 pg (ref 26.0–34.0)
MCHC: 32 g/dL (ref 30.0–36.0)
MCV: 86.6 fL (ref 80.0–100.0)
Monocytes Absolute: 0.7 K/uL (ref 0.1–1.0)
Monocytes Relative: 11 %
Neutro Abs: 4.4 K/uL (ref 1.7–7.7)
Neutrophils Relative %: 69 %
Platelets: 216 K/uL (ref 150–400)
RBC: 2.92 MIL/uL — ABNORMAL LOW (ref 4.22–5.81)
RDW: 16.7 % — ABNORMAL HIGH (ref 11.5–15.5)
WBC: 6.2 K/uL (ref 4.0–10.5)
nRBC: 0 % (ref 0.0–0.2)

## 2023-11-18 LAB — GLUCOSE, CAPILLARY
Glucose-Capillary: 133 mg/dL — ABNORMAL HIGH (ref 70–99)
Glucose-Capillary: 143 mg/dL — ABNORMAL HIGH (ref 70–99)
Glucose-Capillary: 165 mg/dL — ABNORMAL HIGH (ref 70–99)
Glucose-Capillary: 143 mg/dL — ABNORMAL HIGH (ref 70–99)

## 2023-11-18 LAB — MAGNESIUM: Magnesium: 2 mg/dL (ref 1.7–2.4)

## 2023-11-18 LAB — PHOSPHORUS: Phosphorus: 2.4 mg/dL — ABNORMAL LOW (ref 2.5–4.6)

## 2023-11-18 MED ORDER — INSULIN ASPART 100 UNIT/ML IJ SOLN
0.0000 [IU] | Freq: Every day | INTRAMUSCULAR | Status: DC
  Administered 2023-11-21: 2 [IU] via SUBCUTANEOUS
  Administered 2023-11-27: 3 [IU] via SUBCUTANEOUS
  Administered 2023-11-28: 2 [IU] via SUBCUTANEOUS
  Administered 2023-11-29 – 2023-11-30 (×2): 3 [IU] via SUBCUTANEOUS
  Administered 2023-12-01 – 2023-12-07 (×4): 2 [IU] via SUBCUTANEOUS
  Administered 2023-12-09: 3 [IU] via SUBCUTANEOUS
  Administered 2023-12-10 – 2023-12-15 (×3): 2 [IU] via SUBCUTANEOUS
  Administered 2023-12-16: 22:00:00 3 [IU] via SUBCUTANEOUS
  Administered 2023-12-17: 22:00:00 2 [IU] via SUBCUTANEOUS
  Filled 2023-11-18: qty 2
  Filled 2023-11-18: qty 3
  Filled 2023-11-18 (×3): qty 2
  Filled 2023-11-18: qty 3
  Filled 2023-11-18: qty 2
  Filled 2023-11-18: qty 3
  Filled 2023-11-18: qty 2
  Filled 2023-11-18: qty 3
  Filled 2023-11-18 (×2): qty 2
  Filled 2023-11-18: qty 3
  Filled 2023-11-18: qty 2

## 2023-11-18 MED ORDER — INSULIN ASPART 100 UNIT/ML IJ SOLN
0.0000 [IU] | Freq: Three times a day (TID) | INTRAMUSCULAR | Status: DC
  Administered 2023-11-19 (×2): 1 [IU] via SUBCUTANEOUS
  Administered 2023-11-19 – 2023-11-20 (×3): 2 [IU] via SUBCUTANEOUS
  Administered 2023-11-21: 1 [IU] via SUBCUTANEOUS
  Administered 2023-11-21 – 2023-11-22 (×3): 3 [IU] via SUBCUTANEOUS
  Administered 2023-11-22: 2 [IU] via SUBCUTANEOUS
  Administered 2023-11-23: 1 [IU] via SUBCUTANEOUS
  Administered 2023-11-23: 2 [IU] via SUBCUTANEOUS
  Administered 2023-11-23 – 2023-11-25 (×4): 1 [IU] via SUBCUTANEOUS
  Administered 2023-11-26 (×2): 2 [IU] via SUBCUTANEOUS
  Administered 2023-11-27: 1 [IU] via SUBCUTANEOUS
  Administered 2023-11-27 (×2): 2 [IU] via SUBCUTANEOUS
  Administered 2023-11-28: 3 [IU] via SUBCUTANEOUS
  Administered 2023-11-28 – 2023-11-29 (×3): 2 [IU] via SUBCUTANEOUS
  Administered 2023-11-29 (×2): 3 [IU] via SUBCUTANEOUS
  Administered 2023-11-30 (×3): 2 [IU] via SUBCUTANEOUS
  Administered 2023-12-01: 3 [IU] via SUBCUTANEOUS
  Administered 2023-12-01 (×2): 2 [IU] via SUBCUTANEOUS
  Administered 2023-12-02: 7 [IU] via SUBCUTANEOUS
  Administered 2023-12-02: 5 [IU] via SUBCUTANEOUS
  Administered 2023-12-02: 1 [IU] via SUBCUTANEOUS
  Administered 2023-12-03: 2 [IU] via SUBCUTANEOUS
  Administered 2023-12-03: 3 [IU] via SUBCUTANEOUS
  Administered 2023-12-03: 2 [IU] via SUBCUTANEOUS
  Administered 2023-12-04: 1 [IU] via SUBCUTANEOUS
  Administered 2023-12-04 – 2023-12-05 (×3): 2 [IU] via SUBCUTANEOUS
  Administered 2023-12-05 – 2023-12-06 (×3): 1 [IU] via SUBCUTANEOUS
  Administered 2023-12-06: 2 [IU] via SUBCUTANEOUS
  Administered 2023-12-07: 1 [IU] via SUBCUTANEOUS
  Administered 2023-12-07 – 2023-12-09 (×5): 2 [IU] via SUBCUTANEOUS
  Administered 2023-12-09: 1 [IU] via SUBCUTANEOUS
  Administered 2023-12-09 – 2023-12-10 (×4): 3 [IU] via SUBCUTANEOUS
  Administered 2023-12-11 (×2): 2 [IU] via SUBCUTANEOUS
  Administered 2023-12-11: 1 [IU] via SUBCUTANEOUS
  Administered 2023-12-12: 5 [IU] via SUBCUTANEOUS
  Administered 2023-12-12: 1 [IU] via SUBCUTANEOUS
  Administered 2023-12-12: 2 [IU] via SUBCUTANEOUS
  Administered 2023-12-13: 3 [IU] via SUBCUTANEOUS
  Administered 2023-12-13 (×2): 2 [IU] via SUBCUTANEOUS
  Administered 2023-12-14 – 2023-12-15 (×4): 3 [IU] via SUBCUTANEOUS
  Administered 2023-12-15 – 2023-12-17 (×8): 2 [IU] via SUBCUTANEOUS
  Administered 2023-12-18 (×2): 3 [IU] via SUBCUTANEOUS
  Administered 2023-12-18: 12:00:00 2 [IU] via SUBCUTANEOUS
  Administered 2023-12-19: 13:00:00 3 [IU] via SUBCUTANEOUS
  Administered 2023-12-19: 07:00:00 2 [IU] via SUBCUTANEOUS
  Administered 2023-12-19: 17:00:00 5 [IU] via SUBCUTANEOUS
  Administered 2023-12-20: 2 [IU] via SUBCUTANEOUS
  Administered 2023-12-20: 5 [IU] via SUBCUTANEOUS
  Administered 2023-12-20 – 2023-12-21 (×2): 1 [IU] via SUBCUTANEOUS
  Administered 2023-12-21: 2 [IU] via SUBCUTANEOUS
  Administered 2023-12-22: 1 [IU] via SUBCUTANEOUS
  Administered 2023-12-22: 2 [IU] via SUBCUTANEOUS
  Administered 2023-12-23: 1 [IU] via SUBCUTANEOUS
  Administered 2023-12-23: 2 [IU] via SUBCUTANEOUS
  Administered 2023-12-23: 3 [IU] via SUBCUTANEOUS
  Filled 2023-11-18: qty 1
  Filled 2023-11-18 (×3): qty 2
  Filled 2023-11-18: qty 1
  Filled 2023-11-18: qty 2
  Filled 2023-11-18: qty 1
  Filled 2023-11-18: qty 3
  Filled 2023-11-18 (×2): qty 2
  Filled 2023-11-18: qty 3
  Filled 2023-11-18 (×2): qty 2
  Filled 2023-11-18: qty 7
  Filled 2023-11-18: qty 3
  Filled 2023-11-18 (×3): qty 2
  Filled 2023-11-18 (×2): qty 3
  Filled 2023-11-18: qty 2
  Filled 2023-11-18: qty 1
  Filled 2023-11-18: qty 2
  Filled 2023-11-18: qty 3
  Filled 2023-11-18 (×3): qty 2
  Filled 2023-11-18: qty 1
  Filled 2023-11-18: qty 2
  Filled 2023-11-18: qty 1
  Filled 2023-11-18: qty 2
  Filled 2023-11-18: qty 3
  Filled 2023-11-18: qty 1
  Filled 2023-11-18: qty 2
  Filled 2023-11-18: qty 1
  Filled 2023-11-18: qty 3
  Filled 2023-11-18 (×2): qty 2
  Filled 2023-11-18: qty 1
  Filled 2023-11-18 (×5): qty 2
  Filled 2023-11-18 (×3): qty 1
  Filled 2023-11-18: qty 2
  Filled 2023-11-18: qty 1
  Filled 2023-11-18: qty 3
  Filled 2023-11-18: qty 5
  Filled 2023-11-18: qty 1
  Filled 2023-11-18: qty 5
  Filled 2023-11-18 (×3): qty 2
  Filled 2023-11-18 (×2): qty 3
  Filled 2023-11-18: qty 5
  Filled 2023-11-18 (×4): qty 2
  Filled 2023-11-18: qty 3
  Filled 2023-11-18: qty 1
  Filled 2023-11-18 (×3): qty 2
  Filled 2023-11-18: qty 3
  Filled 2023-11-18: qty 2
  Filled 2023-11-18 (×4): qty 1
  Filled 2023-11-18: qty 3
  Filled 2023-11-18 (×4): qty 2
  Filled 2023-11-18 (×2): qty 1
  Filled 2023-11-18: qty 3
  Filled 2023-11-18: qty 5
  Filled 2023-11-18: qty 2
  Filled 2023-11-18: qty 3
  Filled 2023-11-18: qty 2
  Filled 2023-11-18: qty 1
  Filled 2023-11-18: qty 3

## 2023-11-18 MED ORDER — ONDANSETRON HCL 4 MG/2ML IJ SOLN: 4.0000 mg | Freq: Four times a day (QID) | INTRAMUSCULAR | Status: DC | PRN

## 2023-11-18 NOTE — Plan of Care (Signed)

## 2023-11-18 NOTE — Progress Notes (Signed)
 PROGRESS NOTE    [name] [name]  FMW:[id number] DOB: 1955-08-31 DOA: 11/12/2023 PCP: [name] HERO, PA-C   Brief Narrative:  [name] [name] is a 68 y.o. male with a history of atrial fibrillation with slow ventricular response, CAD, CABG x 4, aortic aneurysm s/p repair, CVA, diabetes mellitus type 2, heart failure with preserved EF.  Patient presented secondary to abnormal metabolic panel and while in the emergency department found to have evidence of gross hematuria, anemia and AKI.  Urology consulted.  Patient started IV fluids.  During workup, patient was Hemoccult positive, GI consulted.  Hemoglobin continued to drift down requiring 1 unit PRBC via transfusion.  CT imaging on admission was significant for right pleural effusion concerning for possible empyema.  Pulmonology consulted for chest tube placement which was performed on 11/14. Now Chest Tube is to waterseal and Pulmonary evaluated and recommending removal of his chest tube today. Will need repeat chest x-ray in the a.m and will need Ambulatory Home O2 Screen prior to D/C.   Assessment and Plan:  Gross Hematuria: Possibly related to foley catheter trauma and complicated by Eliquis  use. Urology consulted. CT abdomen/pelvis significant for normal kidneys without hydronephrosis, nonobstructing stone versus vascular calcification of the upper pole left kidney, no renal mass.  Hematuria catheter was declined by patient.  Per urology, possible hemorrhagic cystitis. Hematuria catheter placed and CBI started on 11/14. Urine culture positive for 20,000 colonies/ml of staphylococcus hemolyticus. -Urology recommendations: Treat for possible UTI, continue Foley with continued attempts for replacement of catheter without hematuria catheter, trend H&H as below; urology team was recommending continuing catheter for this 1 to 2 days and then consider a trial of void as his GU bleeding subsided.  They also recommended continuing finasteride  at discharge to try and reduce the prostate source of bleeding.  Will consider trial of void in the a.m.   Possible UTI: Urine culture obtained. Patient started empirically on Ceftriaxone . Urinalysis with rare bacteruria, pyuria and is nitrite positive; significant squamous cells. Urine culture positive for 20,000 colonies/ml of staphylococcus hemolyticus. Started Bactrim  in addition to Abx as below   Acute blood loss anemia / Chronic Iron  Deficiency Anemia: Baseline hemoglobin appears to be around 8-9.  Hemoglobin of 7.5 on admission with downtrend to 6.7 likely related to patient's gross hematuria in addition to heme positive stool.  1 unit of PRBC ordered on 11/13 with posttransfusion hemoglobin up to 8.5.  Hemoglobin/Hct now stable at current trend:  Recent Labs  Lab 11/12/23 2243 11/14/23 0512 11/14/23 1758 11/15/23 0219 11/16/23 0419 11/17/23 0651 11/18/23 0345  HGB 7.8* 6.7* 8.5* 7.8* 8.4* 8.0* 8.1*  HCT 23.0* 21.5* 26.8* 24.6* 26.3* 25.5* 25.3*  MCV  --  87.0  --  87.2 86.5 87.6 86.6  -CTM for S/Sx of Bleeding; repeat CBC in the AM   AKI: Present on admission with creatinine of 1.48 and peak of 1.50. Resolved. -BUN/Cr Trend: Recent Labs  Lab 11/03/23 0934 11/12/23 2202 11/12/23 2243 11/14/23 0512 11/15/23 0219 11/17/23 0651 11/18/23 0345  BUN 33* 94* 93* 52* 29* 18 14  CREATININE 0.84 1.48* 1.50* 0.79 0.70 0.74 0.70  -Avoid Nephrotoxic Medications, Contrast Dyes, Hypotension and Dehydration to Ensure Adequate Renal Perfusion and will need to Renally Adjust Meds. CTM and Trend Renal Function carefully and repeat CMP in the AM   Heme positive stool: No history of hematochezia or melena.  BUN significantly elevated on admission, but patient also has evidence of dehydration.  Complicated by Eliquis  use. GI  consulted and have recommended inpatient EGD/Colonoscopy. Patient continues to adamantly decline endoscopy procedures and so now Diet being advanced and now on Soft diet.     Paroxysmal atrial fibrillation with slow ventricular response: Patient is not on rate control medication secondary to slow ventricular response.  He is managed on Eliquis  secondary to a CHA2DS2-VASc of 4.  Eliquis  held secondary to hematuria and associated acute anemia.  Resume when okay with specialist   Right pleural effusion: Noted on CT imaging with evidence of moderate-sized collection.  Concern for possible empyema secondary to gas seen on imaging.  Last thoracentesis was from February 2025, which was also right-sided and was secondary to heart failure.  Patient is completely asymptomatic.  Pulmonology consulted and placed a right-sided chest tube on 11/14 and have applied lytics for management of loculated effusion -Pulmonology consultation for recommendations: Chest Tube to Waterseal with consideration of CT removal tomorrow if Cx Negative -Follow-up pleural fluid culture, pathology; Overnight Provider received a call overnight on 11/17/2023 regarding her pleural fluid in the body fluid cell count with differential results was reading as a typical number of immature mononuclear cells.  This was sent to pathology and flow cytometry recommended but and available in house -Continue Ceftriaxone /Flagyl for now per PCCM recommendations.  And appreciate further evaluation and recommendation by the pulmonary team currently they are removing his chest tube today given no significant organism in the pleural fluid and patient tolerated removal well.   Diabetes mellitus type 2: Uncontrolled with hyperglycemia based on last hemoglobin A1c of 8.3%. Changing Very Sensitive Novolog  SSI TIDwm to Sensitive Novolog  SSI AC/HS. CTM CBGs per Protocol. CBG Trend:  Recent Labs  Lab 11/17/23 0810 11/17/23 1143 11/17/23 1619 11/17/23 2123 11/18/23 0821 11/18/23 1138 11/18/23 1625  GLUCAP 184* 206* 214* 170* 133* 143* 165*    CAD: S/p CABG x4. Holding ASA given Bleeding   Nausea and Vomiting: Initiate  antiemetics with IV ondansetron    Aortic atherosclerosis: Noted on CT imaging.   Aortic aneurysm: Stable.  Hypophosphatemia: Phos Level is now 2.4. Replete w/ po K Phos Neutral 500 mg x1. CTM and Replete Phos Level in the AM   Chronic HFpEF: Currently stable. Strict I's and O's and Daily Weights. CTM for S/Sx of Volume Overload   Peripheral vascular disease Noted. Not on medication therapy.   Ambulatory dysfunction: Noted. PT/OT recommendations: SNF at discharge   Left foot wound: Noted. Will consult WOC Nurse   Generalized Anxiety Disorder and Depression: C/w Paroxetine  20 mg po daily, Mirtazepine 15 mg po at bedtime; Also on Quetiapine  25 mg po at bedtime  Cognitive dysfunction: Noted. Continue Quetiapine  25 mg po Daily   Hypoalbuminemia: Patient's Albumin Trend: Recent Labs  Lab 10/28/23 0503 10/30/23 0315 11/02/23 0922 11/12/23 2202 11/14/23 0512 11/16/23 0419 11/18/23 0345  ALBUMIN 1.8* 2.0* 1.9* 2.2* 1.9* 2.1* 1.8*  -Continue to Monitor and Trend and repeat CMP in the AM   DVT prophylaxis: SCDs Start: 11/13/23 9266    Code Status: Full Code Family Communication: No family present @ bedside  Disposition Plan:  Level of care: Telemetry Status is: Inpatient Remains inpatient appropriate because: Needs further clinical improvement and clearance by the specialists.   Consultants:  Pulmonary  Procedures:  As delineated as above   Antimicrobials:  Anti-infectives (From admission, onward)    Start     Dose/Rate Route Frequency Ordered Stop   11/16/23 1315  sulfamethoxazole -trimethoprim  (BACTRIM  DS) 800-160 MG per tablet 1 tablet  1 tablet Oral Every 12 hours 11/16/23 1202     11/14/23 1630  metroNIDAZOLE (FLAGYL) IVPB 500 mg        500 mg 100 mL/hr over 60 Minutes Intravenous Every 8 hours 11/14/23 1534     11/14/23 0200  cefTRIAXone  (ROCEPHIN ) 1 g in sodium chloride  0.9 % 100 mL IVPB        1 g 200 mL/hr over 30 Minutes Intravenous Every 24 hours  11/13/23 0821     11/13/23 0215  cefTRIAXone  (ROCEPHIN ) 1 g in sodium chloride  0.9 % 100 mL IVPB        1 g 200 mL/hr over 30 Minutes Intravenous  Once 11/13/23 0208 11/13/23 0411       Subjective: Seen and examined at bedside and denied any current complaints.  Felt okay.  Denied any pain.  Subsequently later this afternoon he had some nausea and vomiting.  Eventually his chest tube was removed.  Urine remains fairly clear with no gross hematuria noted.  No other concerns or close at this time.  Objective: Vitals:   11/18/23 0354 11/18/23 0819 11/18/23 0959 11/18/23 1728  BP: (!) 152/73 (!) 148/68 (!) 146/79 (!) 170/76  Pulse:  (!) 116 (!) 57 (!) 57  Resp: 18 16 16 16   Temp: 98.5 F (36.9 C) 98.4 F (36.9 C) 98.4 F (36.9 C) 98.8 F (37.1 C)  TempSrc: Oral Oral    SpO2: 98% 91% 98% 98%  Weight:      Height:        Intake/Output Summary (Last 24 hours) at 11/18/2023 1932 Last data filed at 11/18/2023 1500 Gross per 24 hour  Intake 200 ml  Output 1300 ml  Net -1100 ml   Filed Weights   11/12/23 2154  Weight: 68 kg   Examination: Physical Exam:  Constitutional: Chronically ill-appearing Caucasian male in no acute distress Respiratory: Diminished to auscultation bilaterally, no wheezing, rales, rhonchi or crackles. Normal respiratory effort and patient is not tachypenic. No accessory muscle use.  Unlabored breathing and right-sided chest tube in place connected to waterseal Cardiovascular: RRR, no murmurs / rubs / gallops. S1 and S2 auscultated. No extremity edema.  Abdomen: Soft, non-tender, non-distended. Bowel sounds positive.  GU: Deferred. Musculoskeletal: No clubbing / cyanosis of digits/nails. No joint deformity upper and lower extremities.  Skin: No rashes, lesions, ulcers on a limited skin evaluation. No induration; Warm and dry.  Neurologic: CN 2-12 grossly intact with no focal deficits. Romberg sign and cerebellar reflexes not assessed.  Psychiatric: He is  awake and alert and appears calm  Data Reviewed: I have personally reviewed following labs and imaging studies  CBC: Recent Labs  Lab 11/12/23 2202 11/12/23 2243 11/14/23 0512 11/14/23 1758 11/15/23 0219 11/16/23 0419 11/17/23 0651 11/18/23 0345  WBC 6.4  --  4.7  --  4.6 6.2 4.9 6.2  NEUTROABS 4.9  --   --   --   --   --   --  4.4  HGB 7.5*   < > 6.7* 8.5* 7.8* 8.4* 8.0* 8.1*  HCT 24.8*   < > 21.5* 26.8* 24.6* 26.3* 25.5* 25.3*  MCV 89.2  --  87.0  --  87.2 86.5 87.6 86.6  PLT 244  --  200  --  199 236 209 216   < > = values in this interval not displayed.   Basic Metabolic Panel: Recent Labs  Lab 11/12/23 2202 11/12/23 2243 11/14/23 0512 11/15/23 0219 11/17/23 0651 11/18/23 0345  NA 136 137  138 139 137 135  K 4.9 5.0 4.3 4.6 4.1 4.4  CL 100 100 107 105 104 101  CO2 27  --  24 24 26 25   GLUCOSE 235* 224* 208* 169* 210* 144*  BUN 94* 93* 52* 29* 18 14  CREATININE 1.48* 1.50* 0.79 0.70 0.74 0.70  CALCIUM  8.4*  --  8.0* 7.9* 7.6* 7.5*  MG  --   --   --   --   --  2.0  PHOS  --   --   --   --   --  2.4*   GFR: Estimated Creatinine Clearance: 82.6 mL/min (by C-G formula based on SCr of 0.7 mg/dL). Liver Function Tests: Recent Labs  Lab 11/12/23 2202 11/14/23 0512 11/16/23 0419 11/18/23 0345  AST 27 17  --  18  ALT 32 26  --  16  ALKPHOS 177* 132*  --  136*  BILITOT <0.2 0.4  --  0.5  PROT 6.7 5.9* 6.1* 5.6*  ALBUMIN 2.2* 1.9* 2.1* 1.8*   No results for input(s): LIPASE, AMYLASE in the last 168 hours. No results for input(s): AMMONIA in the last 168 hours. Coagulation Profile: Recent Labs  Lab 11/12/23 2202  INR 1.6*   Cardiac Enzymes: No results for input(s): CKTOTAL, CKMB, CKMBINDEX, TROPONINI in the last 168 hours. BNP (last 3 results) Recent Labs    03/01/23 1157 08/18/23 1610  PROBNP 399.0* 3,796.0*   HbA1C: No results for input(s): HGBA1C in the last 72 hours. CBG: Recent Labs  Lab 11/17/23 1619 11/17/23 2123  11/18/23 0821 11/18/23 1138 11/18/23 1625  GLUCAP 214* 170* 133* 143* 165*   Lipid Profile: No results for input(s): CHOL, HDL, LDLCALC, TRIG, CHOLHDL, LDLDIRECT in the last 72 hours. Thyroid  Function Tests: No results for input(s): TSH, T4TOTAL, FREET4, T3FREE, THYROIDAB in the last 72 hours. Anemia Panel: No results for input(s): VITAMINB12, FOLATE, FERRITIN, TIBC, IRON , RETICCTPCT in the last 72 hours. Sepsis Labs: No results for input(s): PROCALCITON, LATICACIDVEN in the last 168 hours.  Recent Results (from the past 240 hours)  Urine Culture     Status: Abnormal   Collection Time: 11/13/23  1:10 AM   Specimen: Urine, Catheterized  Result Value Ref Range Status   Specimen Description URINE, CATHETERIZED  Final   Special Requests   Final    NONE Performed at [facility], [address], [place], [place] [address]    Culture 20,000 COLONIES/mL STAPHYLOCOCCUS HAEMOLYTICUS (A)  Final   Report Status 11/15/2023 FINAL  Final   Organism ID, Bacteria STAPHYLOCOCCUS HAEMOLYTICUS (A)  Final      Susceptibility   Staphylococcus haemolyticus - MIC*    CIPROFLOXACIN >=8 RESISTANT Resistant     GENTAMICIN <=0.5 SENSITIVE Sensitive     NITROFURANTOIN <=16 SENSITIVE Sensitive     OXACILLIN >=4 RESISTANT Resistant     TETRACYCLINE <=1 SENSITIVE Sensitive     VANCOMYCIN  <=0.5 SENSITIVE Sensitive     TRIMETH /SULFA  <=10 SENSITIVE Sensitive     RIFAMPIN <=0.5 SENSITIVE Sensitive     Inducible Clindamycin POSITIVE Resistant     * 20,000 COLONIES/mL STAPHYLOCOCCUS HAEMOLYTICUS  MRSA Next Gen by PCR, Nasal     Status: None   Collection Time: 11/13/23  4:02 PM   Specimen: Nasal Mucosa; Nasal Swab  Result Value Ref Range Status   MRSA by PCR Next Gen NOT DETECTED NOT DETECTED Final    Comment: (NOTE) The GeneXpert MRSA Assay (FDA approved for NASAL specimens only), is one component of  a comprehensive MRSA colonization surveillance program. It is  not intended to diagnose MRSA infection nor to guide or monitor treatment for MRSA infections. Test performance is not FDA approved in patients less than 80 years old. Performed at [facility], [address], [place], [place] [address]   Body fluid culture w Gram Stain     Status: None   Collection Time: 11/15/23  5:29 PM   Specimen: Pleural Fluid  Result Value Ref Range Status   Specimen Description PLEURAL  Final   Special Requests RIGHT  Final   Gram Stain NO WBC SEEN NO ORGANISMS SEEN   Final   Culture   Final    NO GROWTH 3 DAYS Performed at [facility], [address], [place], [place] [address]    Report Status 11/18/2023 FINAL  Final    Radiology Studies: DG CHEST PORT 1 VIEW Result Date: 11/18/2023 EXAM: 1 VIEW(S) XRAY OF THE CHEST 11/18/2023 05:57:43 AM COMPARISON: 11/17/2023 status post median sternotomy and CABG procedure. CLINICAL HISTORY: SOB (shortness of breath). FINDINGS: LINES, TUBES AND DEVICES: Unchanged position of right basilar pigtail thoracostomy tube. LUNGS AND PLEURA: Loculated hydropneumothorax overlying the right lung base is unchanged in volume from the previous exam. Small left pleural effusion stable. Similar appearance of bibasilar atelectasis and diffuse increased interstitial markings. HEART AND MEDIASTINUM: No acute abnormality of the cardiac and mediastinal silhouettes. BONES AND SOFT TISSUES: No acute osseous abnormality. IMPRESSION: 1. Unchanged loculated right basilar hydropneumothorax. 2. Unchanged position of the right basilar pigtail thoracostomy tube., 3. Similar bibasilar atelectasis and diffuse interstitial opacities. . Electronically signed by: Waddell Calk MD 11/18/2023 06:08 AM EST RP Workstation: [id number]   DG Chest Port 1 View Result Date: 11/17/2023 CLINICAL DATA:  Follow-up pleural effusion and pneumothorax. EXAM: PORTABLE CHEST 1 VIEW COMPARISON:  Radiographs 11/16/2023 and 11/15/2023. FINDINGS: 0702 hours. Right  basilar pleural drain appears unchanged in position. A loculated pneumothorax at the right costophrenic angle appears stable. Interval mildly improved aeration of the left lung base spread the heart size and mediastinal contours are stable post median sternotomy and CABG. No acute osseous findings are demonstrated. IMPRESSION: 1. Stable loculated pneumothorax at the right costophrenic angle with pleural drain in place. 2. Mildly improved aeration of the left lung base. Electronically Signed   By: Elsie Perone M.D.   On: 11/17/2023 11:20   Scheduled Meds:  Chlorhexidine  Gluconate Cloth  6 each Topical Daily   finasteride  5 mg Oral Daily   insulin  aspart  0-5 Units Subcutaneous QHS   [START ON 11/19/2023] insulin  aspart  0-9 Units Subcutaneous TID WC   mirtazapine   15 mg Oral QHS   PARoxetine   20 mg Oral Daily   QUEtiapine   25 mg Oral QHS   sodium chloride  flush  10 mL Intrapleural Q8H   sulfamethoxazole -trimethoprim   1 tablet Oral Q12H   Continuous Infusions:  cefTRIAXone  (ROCEPHIN )  IV 1 g (11/18/23 0158)   metronidazole 500 mg (11/18/23 1714)   sodium chloride  irrigation      LOS: 5 days   Alejandro Marker, DO [facility] Available via Epic secure chat 7am-7pm After these hours, please refer to coverage provider listed on [email or web address] 11/18/2023, 7:32 PM

## 2023-11-18 NOTE — Progress Notes (Signed)
 Physical Therapy Treatment Patient Details Name: [name] [name] MRN: [id number] DOB: September 01, 1955 Today's Date: 11/18/2023   History of Present Illness 68 y.o. male admitted 11/12/23 for abnormal renal labs. Found to have AKI. Foley catheter was inserted.  He has since been having hematuria, possibly trauma in the setting of Eliquis . Pt with worsening of chronic normocytic anemia. Plan for EGD and colonoscopy. PMHx: PAF, chronic A-Fib, CHF, CAD s/p CABG, PAD, T1DM, HTN, HLD, chronic decubitus ulcer,  chronic anemia with heme positive stool, and chronic diarrhea. Of note, recent hospitalization 10/22-11/5 after fall sustaining L SDH with 4mm shift.    PT Comments  Currently pt is not progressing towards goals. Pt is limited by cognition and no learning is evident despite extra time spent on education for mobility this session. Extra assistance was brought to help with chair follow and tubes. Pt currently is Mod A for rolling R/L and Min A for rolling back to supine from both directions. Difficulty following directions for sequencing with rolling with extra time and multi modal cues in order to decrease need for assist. Due to pt current functional status, home set up and available assistance at home recommending skilled physical therapy services < 3 hours/day in order to address strength, balance and functional mobility to decrease risk for falls, injury, immobility, skin break down and re-hospitalization.      If plan is discharge home, recommend the following: Two people to help with walking and/or transfers;Assistance with cooking/housework;Assist for transportation;Help with stairs or ramp for entrance;Two people to help with bathing/dressing/bathroom;Supervision due to cognitive status;Direct supervision/assist for medications management   Can travel by private vehicle     No  Equipment Recommendations  Wheelchair cushion (measurements PT);Wheelchair (measurements PT);Hoyer lift;Hospital bed        Precautions / Restrictions Precautions Precautions: Fall Recall of Precautions/Restrictions: Impaired Precaution/Restrictions Comments: Pt with impaired vision Restrictions Weight Bearing Restrictions Per Provider Order: No     Mobility  Bed Mobility Overal bed mobility: Needs Assistance Bed Mobility: Rolling Rolling: Mod assist, Used rails         General bed mobility comments: Mod A for rolling R/L in bed. Pt then refused to sit EOB.    Transfers     General transfer comment: Pt declined stating he did not get any sleep becuase there was construction all night.       Balance Overall balance assessment: History of Falls        Communication Communication Communication: Impaired Factors Affecting Communication: Hearing impaired  Cognition Arousal: Alert Behavior During Therapy: Flat affect, Agitated   PT - Cognitive impairments: No family/caregiver present to determine baseline     PT - Cognition Comments: Pt continues to be agitated. Multiple reasons why he cannot participate in mobility. States he will just get up and walk out of the hospital after stating he can't use the RW becuause it is [place]. Following commands: Impaired Following commands impaired: Follows one step commands with increased time, Follows one step commands inconsistently    Cueing Cueing Techniques: Verbal cues, Tactile cues     General Comments General comments (skin integrity, edema, etc.): No signs/symptoms of cardiac/respiratory distress. Extra time spent in pt room with demonstrations of stability of RW. Extra assist was brought for chair follow. Pt unable to understand what chair follow meant. attempted educating in a variety of ways. Pt continued to refuse further mobility after rolling citing no sleep, pain, and too many tubes.      Pertinent Vitals/Pain Pain Assessment  Pain Assessment: 0-10 Pain Score: 9  Pain Location: R knee Pain Descriptors / Indicators: Discomfort,  Aching Pain Intervention(s): Monitored during session, Repositioned, Limited activity within patient's tolerance           PT Goals (current goals can now be found in the care plan section) Acute Rehab PT Goals Patient Stated Goal: none stated PT Goal Formulation: Patient unable to participate in goal setting Time For Goal Achievement: 11/28/23 Potential to Achieve Goals: Fair Progress towards PT goals: Not progressing toward goals - comment (pt is limited by cognition)    Frequency    Min 2X/week      PT Plan  Continue with current POC        AM-PAC PT 6 Clicks Mobility   Outcome Measure  Help needed turning from your back to your side while in a flat bed without using bedrails?: A Lot Help needed moving from lying on your back to sitting on the side of a flat bed without using bedrails?: A Lot Help needed moving to and from a bed to a chair (including a wheelchair)?: Total Help needed standing up from a chair using your arms (e.g., wheelchair or bedside chair)?: Total Help needed to walk in hospital room?: Total Help needed climbing 3-5 steps with a railing? : Total 6 Click Score: 8    End of Session   Activity Tolerance: Other (comment) (pt limited by cognition) Patient left: in bed;with call bell/phone within reach;with bed alarm set Nurse Communication: Mobility status PT Visit Diagnosis: Unsteadiness on feet (R26.81);Other abnormalities of gait and mobility (R26.89);Muscle weakness (generalized) (M62.81);History of falling (Z91.81);Dizziness and giddiness (R42);Difficulty in walking, not elsewhere classified (R26.2)     Time: 1140-1153 PT Time Calculation (min) (ACUTE ONLY): 13 min  Charges:    $Therapeutic Activity: 8-22 mins PT General Charges $$ ACUTE PT VISIT: 1 Visit                     Dorothyann Maier, DPT, CLT  Acute Rehabilitation Services Office: [phone] (Secure chat preferred)    Dorothyann VEAR Maier 11/18/2023, 12:09 PM

## 2023-11-18 NOTE — Progress Notes (Signed)
   NAME:  [name] [name], MRN:  [id number], DOB:  Oct 18, 1955, LOS: 5 ADMISSION DATE:  11/12/2023, CONSULTATION DATE: 11/14/23  REFERRING MD: Briana CHIEF COMPLAINT: Right Pleural Effusion   History of Present Illness:  Pt is a 68 yr male significant hx of afib RVR on eliquis , HTN, CAD, CABGx4, aortic aneursym s/p repair, CVA, DM2, heart failure with preserved EF, and HLD who presented to ED with hematuria, AKI, and anemia. TR[facility] notified for admission and urology consulted in setting of hematuria. CT abdomen/pelvis obtained showing significant right pleural collection with air-fluid levels concern for infection/empyema. Patient afebrile and showing no signs of elevated WBC at this time. PCCM consulted for possible thoracentesis vs chest tube placement in setting of empyema   Pertinent  Medical History   Past Medical History:  Diagnosis Date   Acute urinary retention 01/04/2022   Altered mental status 08/18/2023   Anxiety    Arthritis    Cataract    CHF (congestive heart failure) (HCC)    Constipation    pt states goes EOD- hard stools    Coronary artery disease    Depression    Diabetes mellitus without complication (HCC)    Diabetic retinopathy (HCC)    DKA (diabetic ketoacidoses)    Hyperlipidemia    Hypertension    off meds   Hypertensive retinopathy    Lesion of ulnar nerve, left upper limb 07/12/2017   Non-intractable vomiting without nausea 06/10/2018   Postherpetic neuralgia 08/11/2021   Speculative diagnosis   Psoriasis    Tubular adenoma of colon 03/2019   UTI (urinary tract infection) 01/05/2022     Significant Hospital Events: Including procedures, antibiotic start and stop dates in addition to other pertinent events   11/13 Pulm Consult- for empyema on CT abdomen/pelvis   Interim History / Subjective:  Denies shortness of breath or chest pain On liquid diet and requesting full diet Chest tube output 200cc  Objective    Blood pressure (!) 146/79, pulse (!)  57, temperature 98.4 F (36.9 C), resp. rate 16, height 6' 3 (1.905 m), weight 68 kg, SpO2 98%.        Intake/Output Summary (Last 24 hours) at 11/18/2023 1611 Last data filed at 11/18/2023 1500 Gross per 24 hour  Intake 200 ml  Output 1900 ml  Net -1700 ml   Filed Weights   11/12/23 2154  Weight: 68 kg   Physical Exam: General: Middle-age, does not appear to be in distress HENT: Moist oral mucosa Eyes: Anicteric Respiratory: Decreased breath sounds bilaterally, no airleak  cardiovascular: S1-S2 appreciated Extremities: No clubbing, no edema Neuro: Alert and oriented x 3 Psych: Normal mood, normal affect  Normal WBC No organism and pleural fluid  Resolved problem list   Assessment and Plan   Right hydropneumothorax History of right thoracentesis in the setting of CHF in February 2025  Lymphocytic pleural effusion  Patient did receive pleural lytics,  decision was made to hold lytics  Continue antibiotic therapy  With no significant organism in the pleural fluid, decision was made to remove chest tube today  Tolerated removal well  Other Problem List managed by Primary Gross Hematuria  Possible UTI Anemia IDA AKI PAF CAD DM Aortic atherosclerosis  Chronic HFpEF PVD Left Foot Wound GAD Depression  Cognitive Dysfunction    Jennet Epley, MD [facility] Pager: See Tracey

## 2023-11-18 NOTE — Progress Notes (Signed)
 Chest tube was removed uneventfully  Tolerated well

## 2023-11-18 NOTE — Inpatient Diabetes Management (Signed)
 Inpatient Diabetes Program Recommendations  AACE/ADA: New Consensus Statement on Inpatient Glycemic Control  Target Ranges:  Prepandial:   less than 140 mg/dL      Peak postprandial:   less than 180 mg/dL (1-2 hours)      Critically ill patients:  140 - 180 mg/dL   Lab Results  Component Value Date   GLUCAP 133 (H) 11/18/2023   HGBA1C 8.3 (H) 11/15/2023    Latest Reference Range & Units 11/17/23 11:43 11/17/23 16:19 11/17/23 21:23 11/18/23 08:21 11/18/23 11:38  Glucose-Capillary 70 - 99 mg/dL 793 (H) 785 (H) 829 (H) 133 (H) 143 (H)   Review of Glycemic Control  Diabetes history: DM2  Outpatient Diabetes medications:  Glipizide  5 mg Qam, 2.5 mg Qpm Januvia  100 mg Daily   Current orders for Inpatient glycemic control: Novolog  0-6 units TID    Inpatient Diabetes Program Recommendations:   Please consider changing correction to Novoloog 0-9 units TID + 0-5 units at bedtime.   Thanks, Lavanda Search, RN, MSN, [facility]  Inpatient Diabetes Coordinator  Pager [phone] (8a-5p)

## 2023-11-19 ENCOUNTER — Inpatient Hospital Stay (HOSPITAL_COMMUNITY)

## 2023-11-19 DIAGNOSIS — Z7901 Long term (current) use of anticoagulants: Secondary | ICD-10-CM | POA: Diagnosis not present

## 2023-11-19 DIAGNOSIS — D509 Iron deficiency anemia, unspecified: Secondary | ICD-10-CM | POA: Diagnosis not present

## 2023-11-19 DIAGNOSIS — J869 Pyothorax without fistula: Secondary | ICD-10-CM | POA: Diagnosis not present

## 2023-11-19 DIAGNOSIS — R310 Gross hematuria: Secondary | ICD-10-CM | POA: Diagnosis not present

## 2023-11-19 LAB — MISC LABCORP TEST (SEND OUT): Labcorp test code: 83935

## 2023-11-19 LAB — COMPREHENSIVE METABOLIC PANEL WITH GFR
ALT: 14 U/L (ref 0–44)
AST: 18 U/L (ref 15–41)
Albumin: 2 g/dL — ABNORMAL LOW (ref 3.5–5.0)
Alkaline Phosphatase: 127 U/L — ABNORMAL HIGH (ref 38–126)
Anion gap: 10 (ref 5–15)
BUN: 11 mg/dL (ref 8–23)
CO2: 23 mmol/L (ref 22–32)
Calcium: 7.8 mg/dL — ABNORMAL LOW (ref 8.9–10.3)
Chloride: 102 mmol/L (ref 98–111)
Creatinine, Ser: 0.8 mg/dL (ref 0.61–1.24)
GFR, Estimated: >60 mL/min (ref >60)
Glucose, Bld: 132 mg/dL — ABNORMAL HIGH (ref 70–99)
Potassium: 4.3 mmol/L (ref 3.5–5.1)
Sodium: 135 mmol/L (ref 135–145)
Total Bilirubin: 0.9 mg/dL (ref 0.0–1.2)
Total Protein: 5.8 g/dL — ABNORMAL LOW (ref 6.5–8.1)

## 2023-11-19 LAB — CBC WITH DIFFERENTIAL/PLATELET
Abs Immature Granulocytes: 0.07 K/uL (ref 0.00–0.07)
Basophils Absolute: 0.1 K/uL (ref 0.0–0.1)
Basophils Relative: 1 %
Eosinophils Absolute: 0.2 K/uL (ref 0.0–0.5)
Eosinophils Relative: 3 %
HCT: 25.9 % — ABNORMAL LOW (ref 39.0–52.0)
Hemoglobin: 8.2 g/dL — ABNORMAL LOW (ref 13.0–17.0)
Immature Granulocytes: 1 %
Lymphocytes Relative: 12 %
Lymphs Abs: 0.7 K/uL (ref 0.7–4.0)
MCH: 27.5 pg (ref 26.0–34.0)
MCHC: 31.7 g/dL (ref 30.0–36.0)
MCV: 86.9 fL (ref 80.0–100.0)
Monocytes Absolute: 0.7 K/uL (ref 0.1–1.0)
Monocytes Relative: 11 %
Neutro Abs: 4.4 K/uL (ref 1.7–7.7)
Neutrophils Relative %: 72 %
Platelets: 230 K/uL (ref 150–400)
RBC: 2.98 MIL/uL — ABNORMAL LOW (ref 4.22–5.81)
RDW: 16.6 % — ABNORMAL HIGH (ref 11.5–15.5)
WBC: 6.1 K/uL (ref 4.0–10.5)
nRBC: 0 % (ref 0.0–0.2)

## 2023-11-19 LAB — GLUCOSE, CAPILLARY
Glucose-Capillary: 124 mg/dL — ABNORMAL HIGH (ref 70–99)
Glucose-Capillary: 151 mg/dL — ABNORMAL HIGH (ref 70–99)
Glucose-Capillary: 136 mg/dL — ABNORMAL HIGH (ref 70–99)
Glucose-Capillary: 150 mg/dL — ABNORMAL HIGH (ref 70–99)
Glucose-Capillary: 120 mg/dL — ABNORMAL HIGH (ref 70–99)

## 2023-11-19 LAB — SURGICAL PATHOLOGY

## 2023-11-19 LAB — MAGNESIUM: Magnesium: 1.9 mg/dL (ref 1.7–2.4)

## 2023-11-19 LAB — PHOSPHORUS: Phosphorus: 2.6 mg/dL (ref 2.5–4.6)

## 2023-11-19 LAB — CYTOLOGY - NON PAP

## 2023-11-19 MED ORDER — SENNOSIDES-DOCUSATE SODIUM 8.6-50 MG PO TABS
1.0000 | ORAL_TABLET | Freq: Two times a day (BID) | ORAL | Status: DC
  Administered 2023-11-19 – 2023-11-23 (×5): 1 via ORAL
  Filled 2023-11-19 (×9): qty 1

## 2023-11-19 MED ORDER — BISACODYL 10 MG RE SUPP: 10.0000 mg | Freq: Every day | RECTAL | Status: DC | PRN

## 2023-11-19 MED ORDER — POLYETHYLENE GLYCOL 3350 17 G PO PACK
17.0000 g | PACK | Freq: Two times a day (BID) | ORAL | Status: DC
  Administered 2023-11-19 – 2023-11-21 (×3): 17 g via ORAL
  Filled 2023-11-19 (×9): qty 1

## 2023-11-19 NOTE — Progress Notes (Signed)
 Mobility Specialist Progress Note:    11/19/23 1100  Mobility  Activity Ambulated with assistance  Level of Assistance Moderate assist, patient does 50-74%  Assistive Device Front wheel walker  Distance Ambulated (ft) 16 ft  Activity Response Tolerated well  Mobility Referral Yes  Mobility visit 1 Mobility  Mobility Specialist Start Time (ACUTE ONLY) 1055  Mobility Specialist Stop Time (ACUTE ONLY) 1110  Mobility Specialist Time Calculation (min) (ACUTE ONLY) 15 min   Pt received in bed agreeable to mobility. No physical assistance needed for bed mobility, ModA w/ x2 attempts for STS. No c/o throughout. Pt required Mod VC throughout session. Had a chair follow for safety and to attempt to increase distance. Returned to room w/o fault. Left in bed w/ call bell and personal belongings in reach. All needs met. Bed alarm on.  Thersia Minder Mobility Specialist  Please contact vis Secure Chat or  Rehab Office [phone]

## 2023-11-19 NOTE — Progress Notes (Signed)
 Checked on patient today  Not having any complaints post chest tube removal  Pulmonary will sign off

## 2023-11-19 NOTE — TOC Progression Note (Addendum)
 Transition of Care [facility]) - Progression Note    Patient Details  Name: [name] [name] MRN: [id number] Date of Birth: 21-Jan-1955  Transition of [facility]) CM/SW Contact  [name], LCSW Phone Number: 11/19/2023, 8:44 AM  Clinical Narrative:   CSW spoke with son [name] regarding SNF choice.  His first choice is still [facility].  Second choice would be [facility].  He did speak with his father about SNF, father was agreeable, but father is not remembering conversations from day to day.   1200: message from [name]/[facility] [place]: no beds currently and unsure when bed will be available.    1415: Message sent to son, he is OK to move forward with [facility].  CSW spoke with [name]/[name], updated him on STR to LTC plan, no LTC payer currently.  He is OK to move forward with the STR auth.    1550: SNF auth request made with [name]/[facility].  Expected Discharge Plan: Skilled Nursing Facility Barriers to Discharge: Continued Medical Work up               Expected Discharge Plan and Services In-house Referral: Clinical Social Work   Post Acute Care Choice: Skilled Nursing Facility Living arrangements for the past 2 months: Single Family Home                                       Social Drivers of Health (SDOH) Interventions SDOH Screenings   Food Insecurity: No Food Insecurity (11/13/2023)  Recent Concern: Food Insecurity - Food Insecurity Present (08/18/2023)  Housing: Low Risk  (11/13/2023)  Transportation Needs: No Transportation Needs (11/13/2023)  Utilities: Not At Risk (11/13/2023)  Alcohol Screen: Low Risk  (08/19/2023)  Depression (PHQ2-9): Medium Risk (07/26/2023)  Financial Resource Strain: Low Risk  (08/19/2023)  Physical Activity: Inactive (11/21/2022)  Social Connections: Socially Isolated (11/13/2023)  Stress: Stress Concern Present (11/21/2022)  Tobacco Use: High Risk (11/12/2023)  Health Literacy: Adequate Health Literacy (11/21/2022)     Readmission Risk Interventions    08/20/2023    4:20 PM  Readmission Risk Prevention Plan  Transportation Screening Complete  PCP or Specialist Appt within 3-5 Days Complete  HRI or Home Care Consult Complete  Palliative Care Screening Not Applicable  Medication Review (RN Care Manager) Complete

## 2023-11-19 NOTE — Progress Notes (Signed)
 PROGRESS NOTE    [name] [name]  FMW:[id number] DOB: [birth year] DOA: 11/12/2023 PCP: [name] HERO, PA-C   Brief Narrative:  [name] is a 68 y.o. male with a history of atrial fibrillation with slow ventricular response, CAD, CABG x 4, aortic aneurysm s/p repair, CVA, diabetes mellitus type 2, heart failure with preserved EF.  Patient presented secondary to abnormal metabolic panel and while in the emergency department found to have evidence of gross hematuria, anemia and AKI.  Urology consulted.  Patient started IV fluids.  During workup, patient was Hemoccult positive, GI consulted.  Hemoglobin continued to drift down requiring 1 unit PRBC via transfusion.  CT imaging on admission was significant for right pleural effusion concerning for possible empyema.  Pulmonology consulted for chest tube placement which was performed on 11/14 and this was subsequently removed 11/18/2023.  Pulmonary signed off the case will need repeat chest x-ray in the a.m and will need Ambulatory Home O2 Screen prior to D/C prior to this he will also need a trial of void so we will remove his Foley catheter today..   Assessment and Plan:  Gross Hematuria: Possibly related to foley catheter trauma and complicated by Eliquis  use. Urology consulted. CT abdomen/pelvis significant for normal kidneys without hydronephrosis, nonobstructing stone versus vascular calcification of the upper pole left kidney, no renal mass.  Hematuria catheter was declined by patient.  Per urology, possible hemorrhagic cystitis. Hematuria catheter placed and CBI started on 11/14. Urine culture positive for 20,000 colonies/ml of staphylococcus hemolyticus. -Urology recommendations: Treat for possible UTI, continue Foley with continued attempts for replacement of catheter without hematuria catheter, trend H&H as below; urology team was recommending continuing catheter for this 1 to 2 days and then consider a trial of void as his GU  bleeding subsided.  They also recommended continuing finasteride at discharge to try and reduce the prostate source of bleeding.  Trial of void today. Possible UTI: Urine culture obtained. Patient started empirically on Ceftriaxone . Urinalysis with rare bacteruria, pyuria and is nitrite positive; significant squamous cells. Urine culture positive for 20,000 colonies/ml of staphylococcus hemolyticus. Started Bactrim  in addition to Abx as below   Acute blood loss anemia / Chronic Iron  Deficiency Anemia: Baseline hemoglobin appears to be around 8-9.  Hemoglobin of 7.5 on admission with downtrend to 6.7 likely related to patient's gross hematuria in addition to heme positive stool.  1 unit of PRBC ordered on 11/13 with posttransfusion hemoglobin up to 8.5.  Hemoglobin/Hct now stable at current trend:  Recent Labs  Lab 11/14/23 0512 11/14/23 1758 11/15/23 0219 11/16/23 0419 11/17/23 0651 11/18/23 0345 11/19/23 0514  HGB 6.7* 8.5* 7.8* 8.4* 8.0* 8.1* 8.2*  HCT 21.5* 26.8* 24.6* 26.3* 25.5* 25.3* 25.9*  MCV 87.0  --  87.2 86.5 87.6 86.6 86.9  -CTM for S/Sx of Bleeding; repeat CBC in the AM   AKI: Present on admission with creatinine of 1.48 and peak of 1.50. Resolved. -BUN/Cr Trend: Recent Labs  Lab 11/12/23 2202 11/12/23 2243 11/14/23 0512 11/15/23 0219 11/17/23 0651 11/18/23 0345 11/19/23 0514  BUN 94* 93* 52* 29* 18 14 11   CREATININE 1.48* 1.50* 0.79 0.70 0.74 0.70 0.80  -Avoid Nephrotoxic Medications, Contrast Dyes, Hypotension and Dehydration to Ensure Adequate Renal Perfusion and will need to Renally Adjust Meds. CTM and Trend Renal Function carefully and repeat CMP in the AM   Heme positive stool: No history of hematochezia or melena.  BUN significantly elevated on admission, but patient also has evidence of dehydration.  Complicated by Eliquis  use. GI consulted and have recommended inpatient EGD/Colonoscopy. Patient continues to adamantly decline endoscopy procedures and so now Diet  being advanced and now on Soft diet.  Will need to discuss with the specialist about 1 okay to resume anticoagulation   Paroxysmal atrial fibrillation with slow ventricular response: Patient is not on rate control medication secondary to slow ventricular response.  He is managed on Eliquis  secondary to a CHA2DS2-VASc of 4.  Eliquis  held secondary to hematuria and associated acute anemia.  Resume when okay with specialists and will need to discuss with GI and urology   Right pleural effusion: Noted on CT imaging with evidence of moderate-sized collection.  Concern for possible empyema secondary to gas seen on imaging.  Last thoracentesis was from February 2025, which was also right-sided and was secondary to heart failure.  Patient is completely asymptomatic.  Pulmonology consulted and placed a right-sided chest tube on 11/14 and have applied lytics for management of loculated effusion -Pulmonology consultation for recommendations: Chest Tube to Waterseal with consideration of CT removal tomorrow if Cx Negative -Follow-up pleural fluid culture, pathology; Overnight Provider received a call overnight on 11/17/2023 regarding her pleural fluid in the body fluid cell count with differential results was reading as a typical number of immature mononuclear cells.  This was sent to pathology and flow cytometry recommended but and available in house -Continue Ceftriaxone /Flagyl for now per PCCM recommendations.  And appreciate further evaluation and recommendation by the pulmonary team and patient had his chest tube removed and had no complications or complaints afterwards.  Pulmonary does not have the case on 11/19/2023   Diabetes mellitus type 2: Uncontrolled with hyperglycemia based on last hemoglobin A1c of 8.3%. Changing Very Sensitive Novolog  SSI TIDwm to Sensitive Novolog  SSI AC/HS. CTM CBGs per Protocol. CBG Trend:  Recent Labs  Lab 11/18/23 1138 11/18/23 1625 11/18/23 2109 11/19/23 0608 11/19/23 0819  11/19/23 1206 11/19/23 1622  GLUCAP 143* 165* 143* 124* 151* 136* 150*    CAD: S/p CABG x4. Holding ASA given Bleeding   Nausea and Vomiting: Initiate antiemetics with IV ondansetron  and his nausea vomiting is improved.   Aortic atherosclerosis: Noted on CT imaging.   Aortic aneurysm: Stable.  Hypophosphatemia: Phos Level is now 2.6. CTM and Replete Phos Level in the AM   Chronic HFpEF: Currently stable. Strict I's and O's and Daily Weights. CTM for S/Sx of Volume Overload   Peripheral vascular disease Noted. Not on medication therapy.   Ambulatory dysfunction: Noted. PT/OT recommendations: SNF at discharge   Left foot wound: Noted. Will consult WOC Nurse   Generalized Anxiety Disorder and Depression: C/w Paroxetine  20 mg po daily, Mirtazepine 15 mg po at bedtime; Also on Quetiapine  25 mg po at bedtime  Cognitive dysfunction: Noted. Continue Quetiapine  25 mg po Daily   Hypoalbuminemia: Patient's Albumin Trend: Recent Labs  Lab 10/30/23 0315 11/02/23 0922 11/12/23 2202 11/14/23 0512 11/16/23 0419 11/18/23 0345 11/19/23 0514  ALBUMIN 2.0* 1.9* 2.2* 1.9* 2.1* 1.8* 2.0*  -Continue to Monitor and Trend and repeat CMP in the AM   DVT prophylaxis: SCDs Start: 11/13/23 9266    Code Status: Full Code Family Communication: No family at bedside  Disposition Plan:  Level of care: Telemetry Status is: Inpatient Remains inpatient appropriate because: Needs SNF and will need to make sure that we can resume his anticoagulation without further bleeding and remove his catheter to determine if he can urinate on his own  Consultants:  Pulmonary Urology  Procedures:  As delineated as above  Antimicrobials:  Anti-infectives (From admission, onward)    Start     Dose/Rate Route Frequency Ordered Stop   11/16/23 1315  sulfamethoxazole -trimethoprim  (BACTRIM  DS) 800-160 MG per tablet 1 tablet        1 tablet Oral Every 12 hours 11/16/23 1202     11/14/23 1630  metroNIDAZOLE  (FLAGYL) IVPB 500 mg        500 mg 100 mL/hr over 60 Minutes Intravenous Every 8 hours 11/14/23 1534     11/14/23 0200  cefTRIAXone  (ROCEPHIN ) 1 g in sodium chloride  0.9 % 100 mL IVPB        1 g 200 mL/hr over 30 Minutes Intravenous Every 24 hours 11/13/23 0821     11/13/23 0215  cefTRIAXone  (ROCEPHIN ) 1 g in sodium chloride  0.9 % 100 mL IVPB        1 g 200 mL/hr over 30 Minutes Intravenous  Once 11/13/23 0208 11/13/23 0411       Subjective: Seen and examined bedside was doing okay denied complaints.  States that he is some nausea vomiting yesterday but is doing better now.  Happy that his catheter will be removed today.  No other complaints or concerns at this time is resting.  Objective: Vitals:   11/18/23 2128 11/19/23 0325 11/19/23 0753 11/19/23 1435  BP: (!) 168/77 (!) 143/57 125/68 (!) 157/73  Pulse: 60 60 63 (!) 58  Resp: 18  16 16   Temp: 98.3 F (36.8 C) 98.2 F (36.8 C) 98.4 F (36.9 C) 98.3 F (36.8 C)  TempSrc: Oral Oral  Oral  SpO2: 98% 95% 99% 97%  Weight:      Height:        Intake/Output Summary (Last 24 hours) at 11/19/2023 1914 Last data filed at 11/19/2023 1700 Gross per 24 hour  Intake 840 ml  Output 1250 ml  Net -410 ml   Filed Weights   11/12/23 2154  Weight: 68 kg   Examination: Physical Exam:  Constitutional: Chronically ill-appearing Caucasian male in no acute distress Respiratory: Diminished to auscultation bilaterally, no wheezing, rales, rhonchi or crackles. Normal respiratory effort and patient is not tachypenic. No accessory muscle use.  Unlabored breathing chest tube has been removed Cardiovascular: RRR, no murmurs / rubs / gallops. S1 and S2 auscultated. No extremity edema.  Abdomen: Soft, non-tender, non-distended. Bowel sounds positive.  GU: Deferred.  Foley catheter examined and has clear his urine Musculoskeletal: No clubbing / cyanosis of digits/nails. No joint deformity upper and lower extremities Skin: No rashes, lesions,  ulcers on a limited skin evaluation. No induration; Warm and dry.  Neurologic: CN 2-12 grossly intact with no focal deficits. Romberg sign and cerebellar reflexes not assessed.  Psychiatric: Is awake and alert and appears calm  Data Reviewed: I have personally reviewed following labs and imaging studies  CBC: Recent Labs  Lab 11/12/23 2202 11/12/23 2243 11/15/23 0219 11/16/23 0419 11/17/23 0651 11/18/23 0345 11/19/23 0514  WBC 6.4   < > 4.6 6.2 4.9 6.2 6.1  NEUTROABS 4.9  --   --   --   --  4.4 4.4  HGB 7.5*   < > 7.8* 8.4* 8.0* 8.1* 8.2*  HCT 24.8*   < > 24.6* 26.3* 25.5* 25.3* 25.9*  MCV 89.2   < > 87.2 86.5 87.6 86.6 86.9  PLT 244   < > 199 236 209 216 230   < > = values in this interval not displayed.   Basic Metabolic Panel: Recent  Labs  Lab 11/14/23 0512 11/15/23 0219 11/17/23 0651 11/18/23 0345 11/19/23 0514  NA 138 139 137 135 135  K 4.3 4.6 4.1 4.4 4.3  CL 107 105 104 101 102  CO2 24 24 26 25 23   GLUCOSE 208* 169* 210* 144* 132*  BUN 52* 29* 18 14 11   CREATININE 0.79 0.70 0.74 0.70 0.80  CALCIUM  8.0* 7.9* 7.6* 7.5* 7.8*  MG  --   --   --  2.0 1.9  PHOS  --   --   --  2.4* 2.6   GFR: Estimated Creatinine Clearance: 82.6 mL/min (by C-G formula based on SCr of 0.8 mg/dL). Liver Function Tests: Recent Labs  Lab 11/12/23 2202 11/14/23 0512 11/16/23 0419 11/18/23 0345 11/19/23 0514  AST 27 17  --  18 18  ALT 32 26  --  16 14  ALKPHOS 177* 132*  --  136* 127*  BILITOT <0.2 0.4  --  0.5 0.9  PROT 6.7 5.9* 6.1* 5.6* 5.8*  ALBUMIN 2.2* 1.9* 2.1* 1.8* 2.0*   No results for input(s): LIPASE, AMYLASE in the last 168 hours. No results for input(s): AMMONIA in the last 168 hours. Coagulation Profile: Recent Labs  Lab 11/12/23 2202  INR 1.6*   Cardiac Enzymes: No results for input(s): CKTOTAL, CKMB, CKMBINDEX, TROPONINI in the last 168 hours. BNP (last 3 results) Recent Labs    03/01/23 1157 08/18/23 1610  PROBNP 399.0* 3,796.0*    HbA1C: No results for input(s): HGBA1C in the last 72 hours. CBG: Recent Labs  Lab 11/18/23 2109 11/19/23 0608 11/19/23 0819 11/19/23 1206 11/19/23 1622  GLUCAP 143* 124* 151* 136* 150*   Lipid Profile: No results for input(s): CHOL, HDL, LDLCALC, TRIG, CHOLHDL, LDLDIRECT in the last 72 hours. Thyroid  Function Tests: No results for input(s): TSH, T4TOTAL, FREET4, T3FREE, THYROIDAB in the last 72 hours. Anemia Panel: No results for input(s): VITAMINB12, FOLATE, FERRITIN, TIBC, IRON , RETICCTPCT in the last 72 hours. Sepsis Labs: No results for input(s): PROCALCITON, LATICACIDVEN in the last 168 hours.  Recent Results (from the past 240 hours)  Urine Culture     Status: Abnormal   Collection Time: 11/13/23  1:10 AM   Specimen: Urine, Catheterized  Result Value Ref Range Status   Specimen Description URINE, CATHETERIZED  Final   Special Requests   Final    NONE Performed at [facility], [address], [place], [place] [address]    Culture 20,000 COLONIES/mL STAPHYLOCOCCUS HAEMOLYTICUS (A)  Final   Report Status 11/15/2023 FINAL  Final   Organism ID, Bacteria STAPHYLOCOCCUS HAEMOLYTICUS (A)  Final      Susceptibility   Staphylococcus haemolyticus - MIC*    CIPROFLOXACIN >=8 RESISTANT Resistant     GENTAMICIN <=0.5 SENSITIVE Sensitive     NITROFURANTOIN <=16 SENSITIVE Sensitive     OXACILLIN >=4 RESISTANT Resistant     TETRACYCLINE <=1 SENSITIVE Sensitive     VANCOMYCIN  <=0.5 SENSITIVE Sensitive     TRIMETH /SULFA  <=10 SENSITIVE Sensitive     RIFAMPIN <=0.5 SENSITIVE Sensitive     Inducible Clindamycin POSITIVE Resistant     * 20,000 COLONIES/mL STAPHYLOCOCCUS HAEMOLYTICUS  MRSA Next Gen by PCR, Nasal     Status: None   Collection Time: 11/13/23  4:02 PM   Specimen: Nasal Mucosa; Nasal Swab  Result Value Ref Range Status   MRSA by PCR Next Gen NOT DETECTED NOT DETECTED Final    Comment: (NOTE) The GeneXpert MRSA Assay  (FDA approved for NASAL specimens only),  is one component of a comprehensive MRSA colonization surveillance program. It is not intended to diagnose MRSA infection nor to guide or monitor treatment for MRSA infections. Test performance is not FDA approved in patients less than 39 years old. Performed at [facility], [address], [place], [place] [address]   Body fluid culture w Gram Stain     Status: None   Collection Time: 11/15/23  5:29 PM   Specimen: Pleural Fluid  Result Value Ref Range Status   Specimen Description PLEURAL  Final   Special Requests RIGHT  Final   Gram Stain NO WBC SEEN NO ORGANISMS SEEN   Final   Culture   Final    NO GROWTH 3 DAYS Performed at [facility], [address], [place], [place] [address]    Report Status 11/18/2023 FINAL  Final     Radiology Studies: DG CHEST PORT 1 VIEW Result Date: 11/19/2023 CLINICAL DATA:  Shortness of breath.  Recent chest removal. EXAM: PORTABLE CHEST 1 VIEW COMPARISON:  Radiographs 11/18/2023 and 11/17/2023.  CT 08/18/2023. FINDINGS: 0610 hours. Interval removal of the right basilar pigtail chest tube. Unchanged loculated pneumothorax at the right costophrenic angle. Unchanged small bilateral pleural effusions and probable bibasilar atelectasis. The heart size and mediastinal contours are stable post median sternotomy and CABG. No acute osseous findings are evident. A small amount of soft tissue emphysema is present laterally in the right chest wall. IMPRESSION: 1. Interval removal of right basilar chest tube with unchanged loculated pneumothorax at the right costophrenic angle. 2. Stable small bilateral pleural effusions and bibasilar atelectasis. Electronically Signed   By: Elsie Perone M.D.   On: 11/19/2023 11:52   DG CHEST PORT 1 VIEW Result Date: 11/18/2023 EXAM: 1 VIEW(S) XRAY OF THE CHEST 11/18/2023 05:57:43 AM COMPARISON: 11/17/2023 status post median sternotomy and CABG procedure. CLINICAL HISTORY:  SOB (shortness of breath). FINDINGS: LINES, TUBES AND DEVICES: Unchanged position of right basilar pigtail thoracostomy tube. LUNGS AND PLEURA: Loculated hydropneumothorax overlying the right lung base is unchanged in volume from the previous exam. Small left pleural effusion stable. Similar appearance of bibasilar atelectasis and diffuse increased interstitial markings. HEART AND MEDIASTINUM: No acute abnormality of the cardiac and mediastinal silhouettes. BONES AND SOFT TISSUES: No acute osseous abnormality. IMPRESSION: 1. Unchanged loculated right basilar hydropneumothorax. 2. Unchanged position of the right basilar pigtail thoracostomy tube., 3. Similar bibasilar atelectasis and diffuse interstitial opacities. . Electronically signed by: Taylor Stroud MD 11/18/2023 06:08 AM EST RP Workstation: [id number]   Scheduled Meds:  Chlorhexidine  Gluconate Cloth  6 each Topical Daily   finasteride  5 mg Oral Daily   insulin  aspart  0-5 Units Subcutaneous QHS   insulin  aspart  0-9 Units Subcutaneous TID WC   mirtazapine   15 mg Oral QHS   PARoxetine   20 mg Oral Daily   polyethylene glycol  17 g Oral BID   QUEtiapine   25 mg Oral QHS   senna-docusate  1 tablet Oral BID   sodium chloride  flush  10 mL Intrapleural Q8H   sulfamethoxazole -trimethoprim   1 tablet Oral Q12H   Continuous Infusions:  cefTRIAXone  (ROCEPHIN )  IV 1 g (11/19/23 0203)   metronidazole 500 mg (11/19/23 1659)   sodium chloride  irrigation      LOS: 6 days   Alejandro Marker, DO [facility] Available via Epic secure chat 7am-7pm After these hours, please refer to coverage provider listed on [email or web address] 11/19/2023, 7:14 PM

## 2023-11-19 NOTE — Care Management Important Message (Signed)
 Important Message  Patient Details  Name: [name] [name] MRN: [id number] Date of Birth: [birth year]   Important Message Given:  Yes - Medicare IM     [name] 11/19/2023, 1:19 PM

## 2023-11-20 DIAGNOSIS — J869 Pyothorax without fistula: Secondary | ICD-10-CM | POA: Diagnosis not present

## 2023-11-20 DIAGNOSIS — R310 Gross hematuria: Secondary | ICD-10-CM | POA: Diagnosis not present

## 2023-11-20 DIAGNOSIS — Z7901 Long term (current) use of anticoagulants: Secondary | ICD-10-CM | POA: Diagnosis not present

## 2023-11-20 DIAGNOSIS — D509 Iron deficiency anemia, unspecified: Secondary | ICD-10-CM | POA: Diagnosis not present

## 2023-11-20 LAB — COMPREHENSIVE METABOLIC PANEL WITH GFR
ALT: 13 U/L (ref 0–44)
AST: 19 U/L (ref 15–41)
Albumin: 2 g/dL — ABNORMAL LOW (ref 3.5–5.0)
Alkaline Phosphatase: 131 U/L — ABNORMAL HIGH (ref 38–126)
Anion gap: 11 (ref 5–15)
BUN: 11 mg/dL (ref 8–23)
CO2: 22 mmol/L (ref 22–32)
Calcium: 7.8 mg/dL — ABNORMAL LOW (ref 8.9–10.3)
Chloride: 105 mmol/L (ref 98–111)
Creatinine, Ser: 0.85 mg/dL (ref 0.61–1.24)
GFR, Estimated: >60 mL/min (ref >60)
Glucose, Bld: 121 mg/dL — ABNORMAL HIGH (ref 70–99)
Potassium: 4.4 mmol/L (ref 3.5–5.1)
Sodium: 138 mmol/L (ref 135–145)
Total Bilirubin: 0.6 mg/dL (ref 0.0–1.2)
Total Protein: 5.8 g/dL — ABNORMAL LOW (ref 6.5–8.1)

## 2023-11-20 LAB — MAGNESIUM: Magnesium: 1.9 mg/dL (ref 1.7–2.4)

## 2023-11-20 LAB — CBC WITH DIFFERENTIAL/PLATELET
Abs Immature Granulocytes: 0.02 K/uL (ref 0.00–0.07)
Basophils Absolute: 0 K/uL (ref 0.0–0.1)
Basophils Relative: 1 %
Eosinophils Absolute: 0.2 K/uL (ref 0.0–0.5)
Eosinophils Relative: 3 %
HCT: 27.1 % — ABNORMAL LOW (ref 39.0–52.0)
Hemoglobin: 8.7 g/dL — ABNORMAL LOW (ref 13.0–17.0)
Immature Granulocytes: 0 %
Lymphocytes Relative: 16 %
Lymphs Abs: 0.8 K/uL (ref 0.7–4.0)
MCH: 28.2 pg (ref 26.0–34.0)
MCHC: 32.1 g/dL (ref 30.0–36.0)
MCV: 88 fL (ref 80.0–100.0)
Monocytes Absolute: 0.6 K/uL (ref 0.1–1.0)
Monocytes Relative: 12 %
Neutro Abs: 3.5 K/uL (ref 1.7–7.7)
Neutrophils Relative %: 68 %
Platelets: 213 K/uL (ref 150–400)
RBC: 3.08 MIL/uL — ABNORMAL LOW (ref 4.22–5.81)
RDW: 16.9 % — ABNORMAL HIGH (ref 11.5–15.5)
WBC: 5.2 K/uL (ref 4.0–10.5)
nRBC: 0 % (ref 0.0–0.2)

## 2023-11-20 LAB — GLUCOSE, CAPILLARY
Glucose-Capillary: 119 mg/dL — ABNORMAL HIGH (ref 70–99)
Glucose-Capillary: 157 mg/dL — ABNORMAL HIGH (ref 70–99)
Glucose-Capillary: 179 mg/dL — ABNORMAL HIGH (ref 70–99)
Glucose-Capillary: 175 mg/dL — ABNORMAL HIGH (ref 70–99)

## 2023-11-20 LAB — PHOSPHORUS: Phosphorus: 2.9 mg/dL (ref 2.5–4.6)

## 2023-11-20 MED ORDER — ASPIRIN 81 MG PO TBEC
81.0000 mg | DELAYED_RELEASE_TABLET | Freq: Every day | ORAL | Status: DC
  Administered 2023-11-20 – 2023-11-22 (×3): 81 mg via ORAL
  Filled 2023-11-20 (×3): qty 1

## 2023-11-20 NOTE — Progress Notes (Addendum)
 Physical Therapy Treatment Patient Details Name: [name] [name] MRN: [id number] DOB: 03-Jul-1955 Today's Date: 11/20/2023   History of Present Illness 68 y.o. male admitted 11/12/23 for abnormal renal labs. Found to have AKI. Foley catheter was inserted.  He has since been having hematuria, possibly trauma in the setting of Eliquis . Pt with worsening of chronic normocytic anemia. Plan for EGD and colonoscopy. PMHx: PAF, chronic A-Fib, CHF, CAD s/p CABG, PAD, T1DM, HTN, HLD, chronic decubitus ulcer,  chronic anemia with heme positive stool, and chronic diarrhea. Of note, recent hospitalization 10/22-11/5 after fall sustaining L SDH with 4mm shift.    PT Comments  Began session with seated lower extremity AROM exercises and sit <> stand reps to warm up his muscles and improve his strength. While the pt fatigues quickly, the pt is making good functional progress, now ambulating up to ~100 ft with a rollator and CGA-minA, +2 for chair follow safety. However, he needed x2 standing rest breaks and encouragement to push himself to go this distance. Afterwards he was very fatigued and declined to ambulate back to the room. When the pleth was reliable he was ambulating at >/= 92% on RA. He remains at high risk for falls. Will continue to follow acutely. Current recommendations remain appropriate.       If plan is discharge home, recommend the following: Assistance with cooking/housework;Assist for transportation;Help with stairs or ramp for entrance;Supervision due to cognitive status;Direct supervision/assist for medications management;A little help with walking and/or transfers;A lot of help with bathing/dressing/bathroom;Direct supervision/assist for financial management   Can travel by private vehicle     Yes  Equipment Recommendations  Wheelchair cushion (measurements PT);Wheelchair (measurements PT);Hospital bed;BSC/3in1    Recommendations for Other Services OT consult     Precautions /  Restrictions Precautions Precautions: Fall Recall of Precautions/Restrictions: Impaired Precaution/Restrictions Comments: Pt with impaired vision Restrictions Weight Bearing Restrictions Per Provider Order: No     Mobility  Bed Mobility Overal bed mobility: Needs Assistance Bed Mobility: Sit to Supine, Rolling, Sidelying to Sit Rolling: Contact guard assist, Used rails Sidelying to sit: Min assist, HOB elevated, Used rails   Sit to supine: Contact guard assist   General bed mobility comments: Cues needed to reach L hand to R rail to roll to R, CGA for safety. MinA and cues to push up onto R elbow to ascend trunk and sit up R EOB. CGA and extra time for pt to lift legs back onto bed with return to supine.    Transfers Overall transfer level: Needs assistance Equipment used: Rollator (4 wheels) Transfers: Sit to/from Stand, Bed to chair/wheelchair/BSC Sit to Stand: Min assist   Step pivot transfers: Min assist       General transfer comment: Cues needed for hand placement and to shift his weight anteriorly as he initially was too far posteriorly and his feet were sliding. MinA needed to power up to stand 2x from EOB and 1x from recliner. MinA for balance step pivoting from chair to bed. Pt displays good comprehension to lock his rollator brakes prior to transfers    Ambulation/Gait Ambulation/Gait assistance: Contact guard assist, Min assist, +2 safety/equipment Gait Distance (Feet): 100 Feet (x2 bouts of ~100 ft > ~5 ft) Assistive device: Rollator (4 wheels) Gait Pattern/deviations: Decreased step length - right, Decreased step length - left, Decreased stride length, Step-through pattern, Trunk flexed Gait velocity: reduced Gait velocity interpretation: <1.8 ft/sec, indicate of risk for recurrent falls   General Gait Details: Pt takes slow small steps  with a flexed posture and shoulders elevated. Cued pt to stand upright and relax shoulders, but pt responded I can't.  CGA-minA needed for balance and safety, +2 chair follow for safety. Pt took x2 standing rest breaks to complete the ~100 ft distance.   Stairs             Wheelchair Mobility     Tilt Bed    Modified Rankin (Stroke Patients Only)       Balance Overall balance assessment: History of Falls, Needs assistance Sitting-balance support: No upper extremity supported, Feet supported Sitting balance-Leahy Scale: Fair Sitting balance - Comments: supervision sitting statically EOB   Standing balance support: Bilateral upper extremity supported, During functional activity, Reliant on assistive device for balance Standing balance-Leahy Scale: Poor Standing balance comment: reliant on rollator and intermittent minA                            Communication Communication Communication: Impaired Factors Affecting Communication: Hearing impaired  Cognition Arousal: Alert Behavior During Therapy: Flat affect, Anxious (intermittently irritated)   PT - Cognitive impairments: No family/caregiver present to determine baseline                       PT - Cognition Comments: Pt is familiar to this PT from prior admissions. Pt has a hx of poor safety and deficits awareness and some cognitive concerns at baseline. Currently, the pt is also limited by soime anxiety, needing encouragement and reassurance to push himself during the session. Pt is quick to get irritated at times and needed cues for breathing and active listening to calm back down. Pt seems more limited by vision deficits now than in prior admissions. Following commands: Impaired Following commands impaired: Follows one step commands with increased time, Follows one step commands inconsistently    Cueing Cueing Techniques: Verbal cues, Tactile cues  Exercises General Exercises - Lower Extremity Long Arc Quad: AROM, Strengthening, Both, 10 reps, Seated Hip Flexion/Marching: AROM, Strengthening, Both, 10 reps,  Seated Other Exercises Other Exercises: x2 sit <> stand with minA, pt declined doing more reps    General Comments General comments (skin integrity, edema, etc.): When pleth was reliable he was ambulating at >/= 92% on RA      Pertinent Vitals/Pain Pain Assessment Pain Assessment: Faces Faces Pain Scale: Hurts a little bit Pain Location: generalized discomfort with mobility, buttocks with sitting in recliner Pain Descriptors / Indicators: Discomfort, Sore Pain Intervention(s): Limited activity within patient's tolerance, Monitored during session, Repositioned    Home Living                          Prior Function            PT Goals (current goals can now be found in the care plan section) Acute Rehab PT Goals Patient Stated Goal: to improve his strength and endurance PT Goal Formulation: With patient Time For Goal Achievement: 11/28/23 Potential to Achieve Goals: Fair Progress towards PT goals: Progressing toward goals    Frequency    Min 2X/week      PT Plan      Co-evaluation              AM-PAC PT 6 Clicks Mobility   Outcome Measure  Help needed turning from your back to your side while in a flat bed without using bedrails?: A Little Help needed moving from  lying on your back to sitting on the side of a flat bed without using bedrails?: A Little Help needed moving to and from a bed to a chair (including a wheelchair)?: A Little Help needed standing up from a chair using your arms (e.g., wheelchair or bedside chair)?: A Little Help needed to walk in hospital room?: A Little Help needed climbing 3-5 steps with a railing? : Total 6 Click Score: 16    End of Session Equipment Utilized During Treatment: Gait belt Activity Tolerance: Patient tolerated treatment well Patient left: in bed;with call bell/phone within reach;with bed alarm set   PT Visit Diagnosis: Unsteadiness on feet (R26.81);Other abnormalities of gait and mobility  (R26.89);Muscle weakness (generalized) (M62.81);History of falling (Z91.81);Dizziness and giddiness (R42);Difficulty in walking, not elsewhere classified (R26.2)     Time: 8843-8778 PT Time Calculation (min) (ACUTE ONLY): 25 min  Charges:    $Gait Training: 8-22 mins $Therapeutic Exercise: 8-22 mins PT General Charges $$ ACUTE PT VISIT: 1 Visit                     Theo Ferretti, PT, DPT Acute Rehabilitation Services  Office: [phone]    Theo CHRISTELLA Ferretti 11/20/2023, 1:56 PM

## 2023-11-20 NOTE — Progress Notes (Signed)
     Subjective: Patient failed voiding trial, requiring placement of coud catheter overnight.  Patient is sleeping on rounds.  I did not wake him.  Catheter draining clear yellow urine  Objective: Vital signs in last 24 hours: Temp:  [98.2 F (36.8 C)-98.4 F (36.9 C)] 98.2 F (36.8 C) (11/19 0811) Pulse Rate:  [58-62] 61 (11/19 0811) Resp:  [16] 16 (11/19 0811) BP: (154-163)/(73-87) 154/82 (11/19 0811) SpO2:  [91 %-97 %] 91 % (11/19 9188)  Assessment/Plan: # Gross hematuria #AKI #hemorrhagic cystis  Weaned from CBI.  Failed TOV.  Replacement of Foley catheter on the morning of 11/20/2023.  Continue finasteride and consider initiation of Flomax  if medically reasonable.  Patient to discharge with Foley catheter and follow-up closely in clinic for further treatment of outlet obstruction.    Resolved renal function.  Afebrile.  WBC WNL.  Okay to discharge once medically clear.  Urology will sign off at this time.  Please feel free to call with questions or concerns.  Intake/Output from previous day: 11/18 0701 - 11/19 0700 In: 360 [P.O.:360] Out: 1350 [Urine:1350]  Intake/Output this shift: Total I/O In: 140 [P.O.:140] Out: -   Physical Exam:  General: Sleeping CV: No cyanosis Lungs: equal chest rise Gu: 71f coud catheter in place draining clear yellow urine.  Lab Results: Recent Labs    11/18/23 0345 11/19/23 0514 11/20/23 0523  HGB 8.1* 8.2* 8.7*  HCT 25.3* 25.9* 27.1*   BMET Recent Labs    11/19/23 0514 11/20/23 0523  NA 135 138  K 4.3 4.4  CL 102 105  CO2 23 22  GLUCOSE 132* 121*  BUN 11 11  CREATININE 0.80 0.85  CALCIUM  7.8* 7.8*  HGB 8.2* 8.7*  WBC 6.1 5.2     Studies/Results: DG CHEST PORT 1 VIEW Result Date: 11/19/2023 CLINICAL DATA:  Shortness of breath.  Recent chest removal. EXAM: PORTABLE CHEST 1 VIEW COMPARISON:  Radiographs 11/18/2023 and 11/17/2023.  CT 08/18/2023. FINDINGS: 0610 hours. Interval removal of the right basilar  pigtail chest tube. Unchanged loculated pneumothorax at the right costophrenic angle. Unchanged small bilateral pleural effusions and probable bibasilar atelectasis. The heart size and mediastinal contours are stable post median sternotomy and CABG. No acute osseous findings are evident. A small amount of soft tissue emphysema is present laterally in the right chest wall. IMPRESSION: 1. Interval removal of right basilar chest tube with unchanged loculated pneumothorax at the right costophrenic angle. 2. Stable small bilateral pleural effusions and bibasilar atelectasis. Electronically Signed   By: Elsie Perone M.D.   On: 11/19/2023 11:52       LOS: 7 days   Ole Bourdon, NP [facility] Pager: [phone]  11/20/2023, 11:20 AM

## 2023-11-20 NOTE — Progress Notes (Signed)
 PROGRESS NOTE    [name] [name]  FMW:[id number] DOB: May 21, 1955 DOA: 11/12/2023 PCP: [name] HERO, PA-C   Brief Narrative:  [name] [name] is a 68 y.o. male with a history of atrial fibrillation with slow ventricular response, CAD, CABG x 4, aortic aneurysm s/p repair, CVA, diabetes mellitus type 2, heart failure with preserved EF.  Patient presented secondary to abnormal metabolic panel and while in the emergency department found to have evidence of gross hematuria, anemia and AKI.  Urology consulted.  Patient started IV fluids.  During workup, patient was Hemoccult positive, GI consulted.  Hemoglobin continued to drift down requiring 1 unit PRBC via transfusion.  CT imaging on admission was significant for right pleural effusion concerning for possible empyema.  Pulmonology consulted for chest tube placement which was performed on 11/14 and this was subsequently removed 11/18/2023.    Pulmonary signed off the case. Ambulatory Home O2 Screen done and he did not desaturate. TOV attempted but he failed so Foley reinserted and will continue.  Cussed with urology and GI and GI feels okay to resume his anticoagulation but urology recommends holding it for another 24 hours.  Aspirin  has been reinitiated.  Appears improved and medically stable for SNF and insurance authorization is still pending.  Assessment and Plan:  Gross Hematuria: Possibly related to foley catheter trauma and complicated by Eliquis  use. Urology consulted. CT abdomen/pelvis significant for normal kidneys without hydronephrosis, nonobstructing stone versus vascular calcification of the upper pole left kidney, no renal mass.  Hematuria catheter was declined by patient.  Per urology, possible hemorrhagic cystitis. Hematuria catheter placed and CBI started on 11/14. Urine culture positive for 20,000 colonies/ml of staphylococcus hemolyticus. -Urology recommendations: Treat for possible UTI, continue Foley with continued  attempts for replacement of catheter without hematuria catheter, trend H&H as below; urology team was recommending continuing catheter for this 1 to 2 days and then consider a trial of void as his GU bleeding subsided.  They also recommended continuing finasteride at discharge to try and reduce the prostate source of bleeding.  Trial of void failed so had to be reinserted.  Possible UTI: Urine culture obtained. Patient started empirically on Ceftriaxone  and now will stop. Urinalysis with rare bacteruria, pyuria and is nitrite positive; significant squamous cells. Urine culture positive for 20,000 colonies/ml of staphylococcus hemolyticus. Started Bactrim  in addition to Abx as below but Ceftriaxone  and Flagyl to stop and will continue Bactrim  for another 2 days.    Acute blood loss anemia / Chronic Iron  Deficiency Anemia: Baseline hemoglobin appears to be around 8-9.  Hemoglobin of 7.5 on admission with downtrend to 6.7 likely related to patient's gross hematuria in addition to heme positive stool.  1 unit of PRBC ordered on 11/13 with posttransfusion hemoglobin up to 8.5.  Hemoglobin/Hct now stable at current trend:  Recent Labs  Lab 11/14/23 1758 11/15/23 0219 11/16/23 0419 11/17/23 0651 11/18/23 0345 11/19/23 0514 11/20/23 0523  HGB 8.5* 7.8* 8.4* 8.0* 8.1* 8.2* 8.7*  HCT 26.8* 24.6* 26.3* 25.5* 25.3* 25.9* 27.1*  MCV  --  87.2 86.5 87.6 86.6 86.9 88.0  -CTM for S/Sx of Bleeding; repeat CBC in the AM   AKI: Present on admission with creatinine of 1.48 and peak of 1.50. Resolved. BUN/Cr is now 11/0.85. Avoid Nephrotoxic Medications, Contrast Dyes, Hypotension and Dehydration to Ensure Adequate Renal Perfusion and will need to Renally Adjust Meds. CTM & Trend Renal Function carefully & repeat CMP in the AM   Heme positive stool: No history  of hematochezia or melena.  BUN significantly elevated on admission, but patient also has evidence of dehydration.  Complicated by Eliquis  use. GI consulted  and have recommended inpatient EGD/Colonoscopy. Patient continues to adamantly decline endoscopy procedures and so now Diet being advanced and now on Soft diet.  GI feels that since he is no longer undergoing EGD and colonoscopy they can resume anticoagulation with apixaban  now but the urology team recommends holding it for another day and then resuming 11/20   Paroxysmal atrial fibrillation with slow ventricular response: Patient is not on rate control medication secondary to slow ventricular response.  He is managed on Eliquis  secondary to a CHA2DS2-VASc of 4.  Eliquis  held secondary to hematuria and associated acute anemia.  Discussed with GI and they feel it is okay to resume his anticoagulation at this time and urology recommends holding it for another 24 hours and resuming 11/21/2023   Right pleural effusion: Noted on CT imaging with evidence of moderate-sized collection.  Concern for possible empyema secondary to gas seen on imaging.  Last thoracentesis was from February 2025, which was also right-sided and was secondary to heart failure.  Patient is completely asymptomatic.  Pulmonology consulted and placed a right-sided chest tube on 11/14 and have applied lytics for management of loculated effusion -Pulmonology consultation for recommendations: Chest Tube to Waterseal with consideration of CT removal tomorrow if Cx Negative -Follow-up pleural fluid culture, pathology; Overnight Provider received a call overnight on 11/17/2023 regarding her pleural fluid in the body fluid cell count with differential results was reading as a typical number of immature mononuclear cells.  This was sent to pathology and flow cytometry recommended but and available in house -Stopped Ceftriaxone /Flagyl now.   -Repeat CXR 11/18: Interval removal of right basilar chest tube with unchanged loculated pneumothorax at the right costophrenic angle. Stable small bilateral pleural effusions and bibasilar atelectasis. -Patient  had his chest tube removed and had no complications or complaints afterwards.  Pulmonary signed off the case on 11/19/2023   Diabetes mellitus type 2: Uncontrolled with hyperglycemia based on last hemoglobin A1c of 8.3%. Changing Very Sensitive Novolog  SSI TIDwm to Sensitive Novolog  SSI AC/HS. CTM CBGs per Protocol. CBG Trend:  Recent Labs  Lab 11/19/23 0608 11/19/23 0819 11/19/23 1206 11/19/23 1622 11/19/23 2120 11/20/23 0636 11/20/23 1155  GLUCAP 124* 151* 136* 150* 120* 119* 157*    CAD: S/p CABG x4. Was Holding ASA given Bleeding but its improved so will start back ASA 81 mg daily.   Nausea and Vomiting: Initiate antiemetics with IV ondansetron  and his nausea vomiting is improved.   Aortic atherosclerosis: Noted on CT imaging.   Aortic aneurysm: Stable.  Hypophosphatemia: Phos Level is now 2.9. CTM and Replete as necessary. Repeat Phos Level in the AM   Chronic HFpEF: Currently stable. Strict I's and O's and Daily Weights. CTM for S/Sx of Volume Overload.  Intake/Output Summary (Last 24 hours) at 11/20/2023 1558 Last data filed at 11/20/2023 1404 Gross per 24 hour  Intake 260 ml  Output 1350 ml  Net -1090 ml     Peripheral vascular disease Noted. Not on medication therapy but resume ASA 81 mg po Daily   Ambulatory dysfunction: Noted. PT/OT recommendations: SNF at discharge   Left foot wound: Noted. Will consult WOC Nurse   Generalized Anxiety Disorder and Depression: C/w Paroxetine  20 mg po daily, Mirtazepine 15 mg po at bedtime; Also on Quetiapine  25 mg po at bedtime  Cognitive dysfunction: Noted. Continue Quetiapine  25 mg po Daily  Hypoalbuminemia: Patient's Albumin Trend: Recent Labs  Lab 11/02/23 0922 11/12/23 2202 11/14/23 0512 11/16/23 0419 11/18/23 0345 11/19/23 0514 11/20/23 0523  ALBUMIN 1.9* 2.2* 1.9* 2.1* 1.8* 2.0* 2.0*  -Continue to Monitor and Trend and repeat CMP in the AM   DVT prophylaxis: Place and maintain sequential compression device  Start: 11/20/23 1505 SCDs Start: 11/13/23 9266    Code Status: Full Code Family Communication: No family present @ beside   Disposition Plan:  Level of care: Telemetry Status is: Inpatient Remains inpatient appropriate because: Improved and medically stable for D/C and awaiting Insurance Auth   Consultants:  Pulmonary Urology  Procedures:  As delineated as above   Antimicrobials:  Anti-infectives (From admission, onward)    Start     Dose/Rate Route Frequency Ordered Stop   11/16/23 1315  sulfamethoxazole -trimethoprim  (BACTRIM  DS) 800-160 MG per tablet 1 tablet        1 tablet Oral Every 12 hours 11/16/23 1202     11/14/23 1630  metroNIDAZOLE  (FLAGYL ) IVPB 500 mg  Status:  Discontinued        500 mg 100 mL/hr over 60 Minutes Intravenous Every 8 hours 11/14/23 1534 11/20/23 1606   11/14/23 0200  cefTRIAXone  (ROCEPHIN ) 1 g in sodium chloride  0.9 % 100 mL IVPB  Status:  Discontinued        1 g 200 mL/hr over 30 Minutes Intravenous Every 24 hours 11/13/23 0821 11/20/23 1606   11/13/23 0215  cefTRIAXone  (ROCEPHIN ) 1 g in sodium chloride  0.9 % 100 mL IVPB        1 g 200 mL/hr over 30 Minutes Intravenous  Once 11/13/23 0208 11/13/23 0411       Subjective: Seen and examined at bedside and was doing okay and had no complaints.  Catheter had to be reinserted and is draining clear urine now.  No nausea or vomiting.  Denied any lightheadedness or dizziness.  Denies any chest pain or shortness breath.  No other concerns or complaints at this time.  Objective: Vitals:   11/19/23 2200 11/20/23 0300 11/20/23 0811 11/20/23 1443  BP: (!) 163/87 (!) 154/81 (!) 154/82 (!) 150/78  Pulse: 62 60 61 60  Resp: 16 16 16 16   Temp: 98.4 F (36.9 C) 98.3 F (36.8 C) 98.2 F (36.8 C) 98 F (36.7 C)  TempSrc: Oral Oral Oral Oral  SpO2: 97% 96% 91% 95%  Weight:      Height:        Intake/Output Summary (Last 24 hours) at 11/20/2023 1611 Last data filed at 11/20/2023 1404 Gross per 24 hour   Intake 260 ml  Output 1350 ml  Net -1090 ml   Filed Weights   11/12/23 2154  Weight: 68 kg   Examination: Physical Exam:  Constitutional: Chronically ill-appearing Caucasian male in no acute distress Respiratory: Diminished to auscultation bilaterally with some coarse breath sounds no appreciable wheezing, rales or rhonchi.  Has slight crackles noted.  Normal respiratory effort and patient is not tachypenic. No accessory muscle use.  Unlabored breathing and not wearing any supplemental oxygen nasal cannula Cardiovascular: RRR, no murmurs / rubs / gallops. S1 and S2 auscultated.  Trace extremity edema Abdomen: Soft, non-tender, non-distended. Bowel sounds positive.  GU: Deferred.  Catheter is in place now Musculoskeletal: No clubbing / cyanosis of digits/nails. No joint deformity upper and lower extremities.  Skin: No rashes, lesions, ulcers on a limited skin evaluation. No induration; Warm and dry.  Neurologic: CN 2-12 grossly intact with no focal deficits. Romberg sign  and cerebellar reflexes not assessed.  Psychiatric: Appears calm and is awake and alert  Data Reviewed: I have personally reviewed following labs and imaging studies  CBC: Recent Labs  Lab 11/16/23 0419 11/17/23 0651 11/18/23 0345 11/19/23 0514 11/20/23 0523  WBC 6.2 4.9 6.2 6.1 5.2  NEUTROABS  --   --  4.4 4.4 3.5  HGB 8.4* 8.0* 8.1* 8.2* 8.7*  HCT 26.3* 25.5* 25.3* 25.9* 27.1*  MCV 86.5 87.6 86.6 86.9 88.0  PLT 236 209 216 230 213   Basic Metabolic Panel: Recent Labs  Lab 11/15/23 0219 11/17/23 0651 11/18/23 0345 11/19/23 0514 11/20/23 0523  NA 139 137 135 135 138  K 4.6 4.1 4.4 4.3 4.4  CL 105 104 101 102 105  CO2 24 26 25 23 22   GLUCOSE 169* 210* 144* 132* 121*  BUN 29* 18 14 11 11   CREATININE 0.70 0.74 0.70 0.80 0.85  CALCIUM  7.9* 7.6* 7.5* 7.8* 7.8*  MG  --   --  2.0 1.9 1.9  PHOS  --   --  2.4* 2.6 2.9   GFR: Estimated Creatinine Clearance: 77.8 mL/min (by C-G formula based on SCr of  0.85 mg/dL). Liver Function Tests: Recent Labs  Lab 11/14/23 0512 11/16/23 0419 11/18/23 0345 11/19/23 0514 11/20/23 0523  AST 17  --  18 18 19   ALT 26  --  16 14 13   ALKPHOS 132*  --  136* 127* 131*  BILITOT 0.4  --  0.5 0.9 0.6  PROT 5.9* 6.1* 5.6* 5.8* 5.8*  ALBUMIN 1.9* 2.1* 1.8* 2.0* 2.0*   No results for input(s): LIPASE, AMYLASE in the last 168 hours. No results for input(s): AMMONIA in the last 168 hours. Coagulation Profile: No results for input(s): INR, PROTIME in the last 168 hours. Cardiac Enzymes: No results for input(s): CKTOTAL, CKMB, CKMBINDEX, TROPONINI in the last 168 hours. BNP (last 3 results) Recent Labs    03/01/23 1157 08/18/23 1610  PROBNP 399.0* 3,796.0*   HbA1C: No results for input(s): HGBA1C in the last 72 hours. CBG: Recent Labs  Lab 11/19/23 1206 11/19/23 1622 11/19/23 2120 11/20/23 0636 11/20/23 1155  GLUCAP 136* 150* 120* 119* 157*   Lipid Profile: No results for input(s): CHOL, HDL, LDLCALC, TRIG, CHOLHDL, LDLDIRECT in the last 72 hours. Thyroid  Function Tests: No results for input(s): TSH, T4TOTAL, FREET4, T3FREE, THYROIDAB in the last 72 hours. Anemia Panel: No results for input(s): VITAMINB12, FOLATE, FERRITIN, TIBC, IRON , RETICCTPCT in the last 72 hours. Sepsis Labs: No results for input(s): PROCALCITON, LATICACIDVEN in the last 168 hours.  Recent Results (from the past 240 hours)  Urine Culture     Status: Abnormal   Collection Time: 11/13/23  1:10 AM   Specimen: Urine, Catheterized  Result Value Ref Range Status   Specimen Description URINE, CATHETERIZED  Final   Special Requests   Final    NONE Performed at [facility], [address] [place], [place] [address]    Culture 20,000 COLONIES/mL STAPHYLOCOCCUS HAEMOLYTICUS (A)  Final   Report Status 11/15/2023 FINAL  Final   Organism ID, Bacteria STAPHYLOCOCCUS HAEMOLYTICUS (A)  Final      Susceptibility    Staphylococcus haemolyticus - MIC*    CIPROFLOXACIN >=8 RESISTANT Resistant     GENTAMICIN <=0.5 SENSITIVE Sensitive     NITROFURANTOIN <=16 SENSITIVE Sensitive     OXACILLIN >=4 RESISTANT Resistant     TETRACYCLINE <=1 SENSITIVE Sensitive     VANCOMYCIN  <=0.5 SENSITIVE Sensitive  TRIMETH /SULFA  <=10 SENSITIVE Sensitive     RIFAMPIN <=0.5 SENSITIVE Sensitive     Inducible Clindamycin POSITIVE Resistant     * 20,000 COLONIES/mL STAPHYLOCOCCUS HAEMOLYTICUS  MRSA Next Gen by PCR, Nasal     Status: None   Collection Time: 11/13/23  4:02 PM   Specimen: Nasal Mucosa; Nasal Swab  Result Value Ref Range Status   MRSA by PCR Next Gen NOT DETECTED NOT DETECTED Final    Comment: (NOTE) The GeneXpert MRSA Assay (FDA approved for NASAL specimens only), is one component of a comprehensive MRSA colonization surveillance program. It is not intended to diagnose MRSA infection nor to guide or monitor treatment for MRSA infections. Test performance is not FDA approved in patients less than 73 years old. Performed at [facility], [address] [place], [place] [address]   Body fluid culture w Gram Stain     Status: None   Collection Time: 11/15/23  5:29 PM   Specimen: Pleural Fluid  Result Value Ref Range Status   Specimen Description PLEURAL  Final   Special Requests RIGHT  Final   Gram Stain NO WBC SEEN NO ORGANISMS SEEN   Final   Culture   Final    NO GROWTH 3 DAYS Performed at [facility], [address], [place], [place] [address]    Report Status 11/18/2023 FINAL  Final    Radiology Studies: DG CHEST PORT 1 VIEW Result Date: 11/19/2023 CLINICAL DATA:  Shortness of breath.  Recent chest removal. EXAM: PORTABLE CHEST 1 VIEW COMPARISON:  Radiographs 11/18/2023 and 11/17/2023.  CT 08/18/2023. FINDINGS: 0610 hours. Interval removal of the right basilar pigtail chest tube. Unchanged loculated pneumothorax at the right costophrenic angle. Unchanged small bilateral pleural  effusions and probable bibasilar atelectasis. The heart size and mediastinal contours are stable post median sternotomy and CABG. No acute osseous findings are evident. A small amount of soft tissue emphysema is present laterally in the right chest wall. IMPRESSION: 1. Interval removal of right basilar chest tube with unchanged loculated pneumothorax at the right costophrenic angle. 2. Stable small bilateral pleural effusions and bibasilar atelectasis. Electronically Signed   By: Elsie Perone M.D.   On: 11/19/2023 11:52   Scheduled Meds:  aspirin  EC  81 mg Oral Daily   Chlorhexidine  Gluconate Cloth  6 each Topical Daily   finasteride  5 mg Oral Daily   insulin  aspart  0-5 Units Subcutaneous QHS   insulin  aspart  0-9 Units Subcutaneous TID WC   mirtazapine   15 mg Oral QHS   PARoxetine   20 mg Oral Daily   polyethylene glycol  17 g Oral BID   QUEtiapine   25 mg Oral QHS   senna-docusate  1 tablet Oral BID   sodium chloride  flush  10 mL Intrapleural Q8H   sulfamethoxazole -trimethoprim   1 tablet Oral Q12H   Continuous Infusions:  sodium chloride  irrigation      LOS: 7 days   Alejandro Marker, DO [facility] Available via Epic secure chat 7am-7pm After these hours, please refer to coverage provider listed on [email or web address] 11/20/2023, 4:11 PM

## 2023-11-20 NOTE — Plan of Care (Signed)
  Problem: Clinical Measurements: ?Goal: Ability to maintain clinical measurements within normal limits will improve ?Outcome: Progressing ?Goal: Will remain free from infection ?Outcome: Progressing ?Goal: Diagnostic test results will improve ?Outcome: Progressing ?Goal: Respiratory complications will improve ?Outcome: Progressing ?  ?Problem: Activity: ?Goal: Risk for activity intolerance will decrease ?Outcome: Progressing ?  ?

## 2023-11-20 NOTE — Progress Notes (Signed)
 Patient retaining after foley removal yesterday evening, refer to flowsheet for bladder scan volumes. Patient denied any pain, discomfort or distention, stating he did not feel any urge to void. Bright red bloody drainage from meatus with small clots. On call provider made aware. 6N RN came to place I&O coude per order, 6N RN stated the flow of urine was very slow and suggested to keep coude in place due to patient being very hard to catheterize. Notified on call provider, new orders in place. Urine output amber with no clots at this time.

## 2023-11-21 ENCOUNTER — Other Ambulatory Visit: Payer: Self-pay | Admitting: Physician Assistant

## 2023-11-21 DIAGNOSIS — R310 Gross hematuria: Secondary | ICD-10-CM | POA: Diagnosis not present

## 2023-11-21 DIAGNOSIS — D509 Iron deficiency anemia, unspecified: Secondary | ICD-10-CM | POA: Diagnosis not present

## 2023-11-21 DIAGNOSIS — Z7901 Long term (current) use of anticoagulants: Secondary | ICD-10-CM | POA: Diagnosis not present

## 2023-11-21 DIAGNOSIS — Z1330 Encounter for screening examination for mental health and behavioral disorders, unspecified: Secondary | ICD-10-CM

## 2023-11-21 DIAGNOSIS — J869 Pyothorax without fistula: Secondary | ICD-10-CM | POA: Diagnosis not present

## 2023-11-21 LAB — PHOSPHORUS: Phosphorus: 2.8 mg/dL (ref 2.5–4.6)

## 2023-11-21 LAB — COMPREHENSIVE METABOLIC PANEL WITH GFR
ALT: 12 U/L (ref 0–44)
AST: 15 U/L (ref 15–41)
Albumin: 1.9 g/dL — ABNORMAL LOW (ref 3.5–5.0)
Alkaline Phosphatase: 129 U/L — ABNORMAL HIGH (ref 38–126)
Anion gap: 8 (ref 5–15)
BUN: 10 mg/dL (ref 8–23)
CO2: 25 mmol/L (ref 22–32)
Calcium: 7.8 mg/dL — ABNORMAL LOW (ref 8.9–10.3)
Chloride: 101 mmol/L (ref 98–111)
Creatinine, Ser: 0.82 mg/dL (ref 0.61–1.24)
GFR, Estimated: >60 mL/min (ref >60)
Glucose, Bld: 138 mg/dL — ABNORMAL HIGH (ref 70–99)
Potassium: 4.3 mmol/L (ref 3.5–5.1)
Sodium: 134 mmol/L — ABNORMAL LOW (ref 135–145)
Total Bilirubin: 0.6 mg/dL (ref 0.0–1.2)
Total Protein: 5.5 g/dL — ABNORMAL LOW (ref 6.5–8.1)

## 2023-11-21 LAB — CBC WITH DIFFERENTIAL/PLATELET
Abs Immature Granulocytes: 0.02 K/uL (ref 0.00–0.07)
Basophils Absolute: 0 K/uL (ref 0.0–0.1)
Basophils Relative: 1 %
Eosinophils Absolute: 0.2 K/uL (ref 0.0–0.5)
Eosinophils Relative: 3 %
HCT: 26.1 % — ABNORMAL LOW (ref 39.0–52.0)
Hemoglobin: 8.1 g/dL — ABNORMAL LOW (ref 13.0–17.0)
Immature Granulocytes: 0 %
Lymphocytes Relative: 17 %
Lymphs Abs: 0.9 K/uL (ref 0.7–4.0)
MCH: 27.6 pg (ref 26.0–34.0)
MCHC: 31 g/dL (ref 30.0–36.0)
MCV: 88.8 fL (ref 80.0–100.0)
Monocytes Absolute: 0.7 K/uL (ref 0.1–1.0)
Monocytes Relative: 12 %
Neutro Abs: 3.5 K/uL (ref 1.7–7.7)
Neutrophils Relative %: 67 %
Platelets: 204 K/uL (ref 150–400)
RBC: 2.94 MIL/uL — ABNORMAL LOW (ref 4.22–5.81)
RDW: 16.9 % — ABNORMAL HIGH (ref 11.5–15.5)
WBC: 5.2 K/uL (ref 4.0–10.5)
nRBC: 0 % (ref 0.0–0.2)

## 2023-11-21 LAB — MAGNESIUM: Magnesium: 1.8 mg/dL (ref 1.7–2.4)

## 2023-11-21 LAB — GLUCOSE, CAPILLARY
Glucose-Capillary: 133 mg/dL — ABNORMAL HIGH (ref 70–99)
Glucose-Capillary: 211 mg/dL — ABNORMAL HIGH (ref 70–99)
Glucose-Capillary: 138 mg/dL — ABNORMAL HIGH (ref 70–99)
Glucose-Capillary: 110 mg/dL — ABNORMAL HIGH (ref 70–99)
Glucose-Capillary: 246 mg/dL — ABNORMAL HIGH (ref 70–99)

## 2023-11-21 MED ORDER — APIXABAN 5 MG PO TABS
5.0000 mg | ORAL_TABLET | Freq: Two times a day (BID) | ORAL | Status: DC
  Administered 2023-11-21 – 2023-11-22 (×3): 5 mg via ORAL
  Filled 2023-11-21 (×3): qty 1

## 2023-11-21 MED ORDER — MAGNESIUM SULFATE 2 GM/50ML IV SOLN
2.0000 g | Freq: Once | INTRAVENOUS | Status: AC
  Administered 2023-11-21: 2 g via INTRAVENOUS
  Filled 2023-11-21: qty 50

## 2023-11-21 NOTE — Plan of Care (Signed)
  Problem: Education: Goal: Knowledge of General Education information will improve Description: Including pain rating scale, medication(s)/side effects and non-pharmacologic comfort measures Outcome: Progressing   Problem: Health Behavior/Discharge Planning: Goal: Ability to manage health-related needs will improve Outcome: Progressing   Problem: Clinical Measurements: Goal: Ability to maintain clinical measurements within normal limits will improve Outcome: Progressing   Problem: Clinical Measurements: Goal: Ability to maintain clinical measurements within normal limits will improve Outcome: Progressing Goal: Will remain free from infection Outcome: Progressing Goal: Diagnostic test results will improve Outcome: Progressing Goal: Respiratory complications will improve Outcome: Progressing Goal: Cardiovascular complication will be avoided Outcome: Progressing   Problem: Activity: Goal: Risk for activity intolerance will decrease Outcome: Progressing   Problem: Pain Managment: Goal: General experience of comfort will improve and/or be controlled Outcome: Progressing   Problem: Elimination: Goal: Will not experience complications related to bowel motility Outcome: Progressing Goal: Will not experience complications related to urinary retention Outcome: Progressing   Problem: Safety: Goal: Ability to remain free from injury will improve Outcome: Progressing   Problem: Skin Integrity: Goal: Risk for impaired skin integrity will decrease Outcome: Progressing   Problem: Health Behavior/Discharge Planning: Goal: Ability to identify and utilize available resources and services will improve Outcome: Progressing Goal: Ability to manage health-related needs will improve Outcome: Progressing   Problem: Metabolic: Goal: Ability to maintain appropriate glucose levels will improve Outcome: Progressing

## 2023-11-21 NOTE — TOC Progression Note (Addendum)
 Transition of Care [facility]) - Progression Note    Patient Details  Name: [name] [name] MRN: [id number] Date of Birth: [birth year]  Transition of [facility]) CM/SW Contact  [name], LCSW Phone Number: 11/21/2023, 10:17 AM  Clinical Narrative:   CSW informed by [name]/HTA: P2P being offered with Dr Janit by 5pm today, [phone].    CSW spoke with [name].  He reports SNF [facility] was initially denied last admission (10/22-11/5) [name] appealed, ended up having phone conversation with Dr Janit where it was agreed that STR would be approved with understanding that this would transition to LTC.  This was what was in process with [facility] when pt was readmitted.  Pt was only at [facility] from 11/5-11/11 before he was readmitted.  [name] continues to be in agreement with the prior plan of STR to LTC, now at [name].  1040: CSW spoke with [name]/[name]: pt would need to come as STR pt and transition to LTC.  They cannot receive pt for LTC only.   1200: MD reports P2P completed, auth denied.   Expected Discharge Plan: Skilled Nursing Facility Barriers to Discharge: Continued Medical Work up               Expected Discharge Plan and Services In-house Referral: Clinical Social Work   Post Acute Care Choice: Skilled Nursing Facility Living arrangements for the past 2 months: Single Family Home                                       Social Drivers of Health (SDOH) Interventions SDOH Screenings   Food Insecurity: No Food Insecurity (11/13/2023)  Recent Concern: Food Insecurity - Food Insecurity Present (08/18/2023)  Housing: Low Risk  (11/13/2023)  Transportation Needs: No Transportation Needs (11/13/2023)  Utilities: Not At Risk (11/13/2023)  Alcohol Screen: Low Risk  (08/19/2023)  Depression (PHQ2-9): Medium Risk (07/26/2023)  Financial Resource Strain: Low Risk  (08/19/2023)  Physical Activity: Inactive (11/21/2022)  Social Connections: Socially  Isolated (11/13/2023)  Stress: Stress Concern Present (11/21/2022)  Tobacco Use: High Risk (11/12/2023)  Health Literacy: Adequate Health Literacy (11/21/2022)    Readmission Risk Interventions    08/20/2023    4:20 PM  Readmission Risk Prevention Plan  Transportation Screening Complete  PCP or Specialist Appt within 3-5 Days Complete  HRI or Home Care Consult Complete  Palliative Care Screening Not Applicable  Medication Review (RN Care Manager) Complete

## 2023-11-21 NOTE — Consult Note (Signed)
 The patient is a 68 year old male evaluated today for medical decision-making capacity regarding discharge disposition. He is alert, attentive, and oriented to person, place, time, and situation. He is able to clearly describe the reason for his hospitalization, stating he "fell at home," demonstrating accurate understanding of the cause of his admission. He also understands his current medical status and the recovery process following his fall. When asked why he remains hospitalized, he reports that "four doctors signed off on me yesterday" and explains that he called his son to pick him up but did not hear back, showing awareness of events and timeline. Regarding the proposed treatment, he demonstrates understanding that the medical team and his son recommend discharge to a skilled nursing rehabilitation facility to address his functional decline and fall risk. He is able to articulate the alternative option, which is returning home with his current support system, including a caregiver who assists him three hours daily, home safety cameras managed by his son, and a life-alert bracelet for emergencies.  The patient demonstrates understanding of the risks and reasonable consequences of declining rehabilitation, including the possibility of recurrent falls, injury, and delayed recovery. He verbalizes that he "knows he is at risk for falling" but feels he can manage safely in his home environment. He states that prior rehabilitation facilities "did not rehab me," and he believes he can perform rehabilitation exercises independently at home, offering a coherent explanation of his reasoning. He is able to compare the benefits and risks of each option, expresses a stable and consistent preference to return home, and provides logical justification for his choice. There is no evidence of psychosis, delirium, severe cognitive impairment, or untreated mood symptoms affecting his thinking. Although his affect is flat, he  remains cooperative, engaged, and able to communicate a clear choice; he demonstrates understanding, appreciation, reasoning, and ability to express a decision. Based on today's assessment, the patient has intact medical decision-making capacity to determine his discharge plan and is capable of electing to return home despite medical recommendations for skilled rehabilitation.  Decision being assessed: Ability to determine discharge disposition (home vs. skilled nursing rehabilitation)  In an evaluation of capacity, all four criteria must be met for the patient to have capacity to make the decision in question.  Criterion 1: Clear and Consistent Voluntary Choice  Meets Criterion: Yes  Patient clearly expresses his desire to return home rather than discharge to a rehabilitation facility.  He consistently states this preference throughout the interview and provides no evidence of coercion or external pressure.  His choice remains stable and voluntary.  Criterion 2: Understanding of Condition, Proposed Treatment, and Alternatives  Meets Criterion: Yes  Patient accurately states he was hospitalized due to a fall at home.  He understands that the proposed treatment is discharge to a skilled nursing rehabilitation facility due to functional decline and fall risk.  He identifies the alternative as returning home with his caregiver, home safety cameras, and life-alert device.  He demonstrates understanding of the purpose of rehab and why it is being recommended.  Criterion 3: Appreciation of How the Information Applies to Him Personally  Meets Criterion: Yes  Patient acknowledges that he remains at risk for falls and potential injury at home.  He verbalizes understanding that refusing rehab could result in slower recovery or further medical complications.  He appreciates that the medical team and his son are recommending rehab due to his condition and safety concerns, yet remains  consistent in his choice to return home.  Criterion  4: Reasoning and Ability to Justify Decision  Meets Criterion: Yes  Patient provides logical reasoning: prior rehab stays were unhelpful, he believes he rehabilitates better independently, and he has supports in place (caregiver, cameras, life-alert).  He compares risks and benefits of both options and gives rational explanation for preferring home discharge.  No evidence of psychosis, intoxication, delirium, or severe mood disorder impairing judgment.  In this case, the patient [name] [name]  DOES have capacity to make medical decisions regarding refusal of SNF.    See patient interview below for details. Of note, this capacity evaluation assesses only for the specified decision documented above at the time of the assessment and is not a substitute for determination of the patient's overall competency, which can only be adjudicated.   Conclusion: At this time, there is NOT sufficient evidence to warrant removal of the patient's rights for medical decision-making as it pertains to SNF refusal.    Medical Decision-Making Capacity: Patient has capacity to make task-specific medical decisions.  It is important to note that decision-making capacity is assessed for a specific decision at a specific moment in time. The reason for this is different decisions have different risks and benefits. The threshold for an individual to display adequate decision-making capacity to provide consent to or refuse treatment should reflect the risks and benefits of that specific decision. Furthermore, decision-making capacity is time sensitive meaning if an individual does not have the capacity to make a decision one day, this does not mean that they will not have the capacity to make the same medical decision at a later time.   Psychiatry consult service to sign off at this time.  Thank you for this capacity consult.

## 2023-11-21 NOTE — Evaluation (Signed)
 Occupational Therapy Evaluation Patient Details Name: [name] [name] MRN: [id number] DOB: Dec 30, 1955 Today's Date: 11/21/2023   History of Present Illness   68 y.o. male admitted 11/12/23 for abnormal renal labs. Found to have AKI and potential UTI. Foley catheter was inserted.  He has since been having hematuria, possibly trauma in the setting of Eliquis . Pt with worsening of chronic normocytic anemia. Chest CT showed pleural effusion. Plan for EGD and colonoscopy. PMHx: PAF, chronic A-Fib, CHF, CAD s/p CABG, PAD, T2DM, HTN, HLD, chronic decubitus ulcer,  chronic anemia with heme positive stool, and chronic diarrhea. Of note, recent hospitalization 10/22-11/5 after fall sustaining L SDH with 4mm shift.     Clinical Impressions PTA, pt admitted from SNF where he has been for rehab. Pt ambulatory with a rollator and previously was able to manage ADLs though unsure of current functional abilities at SNF d/t pt current cognitive impairments. Pt presents with additional deficits in strength, endurance and standing balance. Pt requires Min A for UB ADL, Min-Mod A for LB ADLs and CGA for transfers with RW. Pt declined ADL retraining in bathroom and also declined to transfer OOB to recliner at end of session. Will continue acute OT efforts while pt admitted. Patient will benefit from continued inpatient follow up therapy, <3 hours/day at DC.     If plan is discharge home, recommend the following:   A little help with bathing/dressing/bathroom;A lot of help with walking and/or transfers;Direct supervision/assist for financial management;Direct supervision/assist for medications management;Assistance with cooking/housework;Assist for transportation;Help with stairs or ramp for entrance     Functional Status Assessment   Patient has had a recent decline in their functional status and demonstrates the ability to make significant improvements in function in a reasonable and predictable amount of  time.     Equipment Recommendations   None recommended by OT     Recommendations for Other Services         Precautions/Restrictions   Precautions Precautions: Fall Recall of Precautions/Restrictions: Impaired Precaution/Restrictions Comments: low vision Restrictions Weight Bearing Restrictions Per Provider Order: No     Mobility Bed Mobility Overal bed mobility: Needs Assistance Bed Mobility: Sit to Supine, Supine to Sit     Supine to sit: HOB elevated, Used rails, Min assist Sit to supine: Supervision   General bed mobility comments: Min A to lift trunk to sit EOB, able to bring LE back into bed at end of session    Transfers Overall transfer level: Needs assistance Equipment used: Rolling walker (2 wheels) Transfers: Sit to/from Stand Sit to Stand: Contact guard assist           General transfer comment: CGA to stand from bedside with RW during LB ADLs. Pt declined to transfer OOB to recliner at end of session      Balance Overall balance assessment: History of Falls, Needs assistance Sitting-balance support: No upper extremity supported, Feet supported Sitting balance-Leahy Scale: Fair     Standing balance support: Bilateral upper extremity supported, During functional activity, Reliant on assistive device for balance Standing balance-Leahy Scale: Poor                             ADL either performed or assessed with clinical judgement   ADL Overall ADL's : Needs assistance/impaired     Grooming: Set up;Sitting;Wash/dry face   Upper Body Bathing: Minimal assistance;Sitting Upper Body Bathing Details (indicate cue type and reason): assist for back Lower Body Bathing:  Minimal assistance;Sitting/lateral leans;Sit to/from stand Lower Body Bathing Details (indicate cue type and reason): pt able to wash upper LE and partially lower LE. assist for posterior hygiene in standing Upper Body Dressing : Minimal assistance;Sitting                      General ADL Comments: Focus on bedside bathing tasks. 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 pt difficulty describing and noted to have issues looking around to locate objects and staff     Perception         Praxis         Pertinent Vitals/Pain Pain Assessment Pain Assessment: No/denies pain     Extremity/Trunk Assessment Upper Extremity Assessment Upper Extremity Assessment: Generalized weakness;Right hand dominant   Lower Extremity Assessment Lower Extremity Assessment: Defer to PT evaluation   Cervical / Trunk Assessment Cervical / Trunk Assessment: Kyphotic   Communication Communication Communication: Impaired Factors Affecting Communication: Hearing impaired   Cognition Arousal: Alert Behavior During Therapy: Flat affect Cognition: Cognition impaired   Orientation impairments: Time, Situation Awareness: Online awareness impaired, Intellectual awareness impaired Memory impairment (select all impairments): Short-term memory, Working civil service fast streamer, Engineer, structural memory Attention impairment (select first level of impairment): Selective attention, Sustained attention Executive functioning impairment (select all impairments): Initiation, Reasoning, Problem solving, Sequencing OT - Cognition Comments: per chart, pt with poor memory with recall of conversations with medical team from day to day. pt aware of thanksgiving coming up but was unaware that it was on a thursday - thought it was the weekend. aware of balance deficits and fatigue but decreased awareness of situation, cognition concerns impacting safety with daily routine.                  Following commands: Impaired Following commands impaired: Follows one step commands with increased time, Follows one step commands inconsistently     Cueing  General Comments   Cueing Techniques: Verbal cues;Tactile cues      Exercises     Shoulder Instructions      Home Living Family/patient expects to be discharged to:: Skilled nursing facility                                 Additional Comments: Was admitted to [facility] SNF for rehab following a fall at home in October. Per chart, family hoping for transition to LTC in future      Prior Functioning/Environment Prior Level of Function : Patient poor historian/Family not available;Needs assist;History of Falls (last six months)             Mobility Comments: Per chart review, ambulates using rollator. Pt unable to elaborate on what he has been working on in therapy at the facility. Pt sustained a fall in October resulting in SDH. ADLs Comments: Per chart review, pt modI with most ADLs before October and would orders groceries, get transportation through insurance, and used pill box. Sounds like facility staff have been assisting with all ADLs    OT Problem List: Decreased strength;Decreased activity tolerance;Impaired balance (sitting and/or standing);Decreased cognition;Decreased safety awareness;Decreased knowledge of use of DME or AE;Decreased knowledge  of precautions;Cardiopulmonary status limiting activity   OT Treatment/Interventions: Self-care/ADL training;Therapeutic exercise;DME and/or AE instruction;Therapeutic activities;Patient/family education;Balance training;Cognitive remediation/compensation      OT Goals(Current goals can be found in the care plan section)   Acute Rehab OT Goals Patient Stated Goal: go home for the holidays OT Goal Formulation: With patient Time For Goal Achievement: 12/05/23 Potential to Achieve Goals: Fair ADL Goals Pt Will Perform Lower Body Dressing: with  set-up;sit to/from stand;sitting/lateral leans Pt Will Transfer to Toilet: with supervision;ambulating Pt Will Perform Toileting - Clothing Manipulation and hygiene: with supervision;sit to/from stand;sitting/lateral leans Pt/caregiver will Perform Home Exercise Program: Increased strength;Both right and left upper extremity;With theraband;With Supervision;With written HEP provided   OT Frequency:  Min 2X/week    Co-evaluation              AM-PAC OT 6 Clicks Daily Activity     Outcome Measure Help from another person eating meals?: A Little Help from another person taking care of personal grooming?: A Little Help from another person toileting, which includes using toliet, bedpan, or urinal?: A Lot Help from another person bathing (including washing, rinsing, drying)?: A Little Help from another person to put on and taking off regular upper body clothing?: A Little Help from another person to put on and taking off regular lower body clothing?: A Lot 6 Click Score: 16   End of Session Equipment Utilized During Treatment: Rolling walker (2 wheels)  Activity Tolerance: Patient limited by fatigue Patient left: in bed;with call bell/phone within reach;with bed alarm set  OT Visit Diagnosis: Other abnormalities of gait and mobility (R26.89);Muscle weakness (generalized) (M62.81);Other symptoms and signs involving cognitive function                Time: 9172-9147 OT Time Calculation (min): 25 min Charges:  OT General Charges $OT Visit: 1 Visit OT Evaluation $OT Eval Moderate Complexity: 1 Mod OT Treatments $Self Care/Home Management : 8-22 mins  Mliss NOVAK, OTR/L Acute Rehab Services Office: [phone]   Mliss Fish 11/21/2023, 9:13 AM

## 2023-11-21 NOTE — Progress Notes (Signed)
 PROGRESS NOTE    [name] [name]  FMW:[id number] DOB: [birth year] DOA: 11/12/2023 PCP: [name] HERO, PA-C   Brief Narrative:  [name] [name] is a 68 y.o. male with a history of atrial fibrillation with slow ventricular response, CAD, CABG x 4, aortic aneurysm s/p repair, CVA, diabetes mellitus type 2, heart failure with preserved EF.  Patient presented secondary to abnormal metabolic panel and while in the emergency department found to have evidence of gross hematuria, anemia and AKI.  Urology consulted.  Patient started IV fluids.  During workup, patient was Hemoccult positive, GI consulted.  Hemoglobin continued to drift down requiring 1 unit PRBC via transfusion.  CT imaging on admission was significant for right pleural effusion concerning for possible empyema.  Pulmonology consulted for chest tube placement which was performed on 11/14 and this was subsequently removed 11/18/2023.    Pulmonary signed off the case. Ambulatory Home O2 Screen done and he did not desaturate. TOV attempted but he failed so Foley reinserted and will continue.  Discussed with urology and GI and GI feels okay to resume his anticoagulation but urology recommends holding it for another 24 hours.  Aspirin  has been reinitiated.  Appears improved and medically stable for SNF however his insurance authorization was denied despite peer to peer.  Psychiatry was consulted for capacity evaluation patient has the capacity to decline SNF.    Likely will go home with home health however needs to go home with his son noted to have a safe discharge disposition and son feels the patient cannot go home with him so will need to find safe disposition for the patient.   Assessment and Plan:  Gross Hematuria: Possibly related to foley catheter trauma and complicated by Eliquis  use. Urology consulted. CT abdomen/pelvis significant for normal kidneys without hydronephrosis, nonobstructing stone versus vascular calcification of  the upper pole left kidney, no renal mass.  Hematuria catheter was declined by patient.  Per urology, possible hemorrhagic cystitis. Hematuria catheter placed and CBI started on 11/14. Urine culture positive for 20,000 colonies/ml of staphylococcus hemolyticus. -Urology recommendations: Treat for possible UTI, continue Foley with continued attempts for replacement of catheter without hematuria catheter, trend H&H as below; urology team was recommending continuing catheter for this 1 to 2 days and then consider a trial of void as his GU bleeding subsided.  They also recommended continuing finasteride  at discharge to try and reduce the prostate source of bleeding.  Trial of void failed so had to be reinserted.  Restarted on his home anticoagulation  Possible UTI: Urine culture obtained. Patient started empirically on Ceftriaxone  and now will stop. Urinalysis with rare bacteruria, pyuria and is nitrite positive; significant squamous cells. Urine culture positive for 20,000 colonies/ml of staphylococcus hemolyticus. Started Bactrim  in addition to Abx as below but Ceftriaxone  and Flagyl  to stop and will continue Bactrim  for another 1 days    Acute blood loss anemia / Chronic Iron  Deficiency Anemia: Baseline hemoglobin appears to be around 8-9.  Hemoglobin of 7.5 on admission with downtrend to 6.7 likely related to patient's gross hematuria in addition to heme positive stool.  1 unit of PRBC ordered on 11/13 with posttransfusion hemoglobin up to 8.5.  Hemoglobin/Hct now stable at current trend:  Recent Labs  Lab 11/15/23 0219 11/16/23 0419 11/17/23 0651 11/18/23 0345 11/19/23 0514 11/20/23 0523 11/21/23 0515  HGB 7.8* 8.4* 8.0* 8.1* 8.2* 8.7* 8.1*  HCT 24.6* 26.3* 25.5* 25.3* 25.9* 27.1* 26.1*  MCV 87.2 86.5 87.6 86.6 86.9 88.0 88.8  -  CTM for S/Sx of Bleeding closely that he is now being resumed on Eliquis ; repeat CBC in the AM   AKI: Present on admission with creatinine of 1.48 and peak of 1.50.  Resolved. BUN/Cr is now 10/0.82. Avoid Nephrotoxic Medications, Contrast Dyes, Hypotension and Dehydration to Ensure Adequate Renal Perfusion and will need to Renally Adjust Meds. CTM & Trend Renal Function carefully & repeat CMP in the AM   Heme positive stool: No history of hematochezia or melena.  BUN significantly elevated on admission, but patient also has evidence of dehydration.  Complicated by Eliquis  use. GI consulted and have recommended inpatient EGD/Colonoscopy. Patient continues to adamantly decline endoscopy procedures and so now Diet being advanced and now on Soft diet.  GI feels that since he is no longer undergoing EGD and colonoscopy they can resume anticoagulation with apixaban  and since that is okay with the urology team we will resume that 11/21/2023 Paroxysmal atrial fibrillation with slow ventricular response: Patient is not on rate control medication secondary to slow ventricular response.  He is managed on Eliquis  secondary to a CHA2DS2-VASc of 4.  Eliquis  was held secondary to hematuria and associated acute anemia.  After further discussion with GI and urology they recommended resuming 11/21/2023   Right pleural effusion: Noted on CT imaging with evidence of moderate-sized collection.  Concern for possible empyema secondary to gas seen on imaging.  Last thoracentesis was from February 2025, which was also right-sided and was secondary to heart failure.  Patient is completely asymptomatic.  Pulmonology consulted and placed a right-sided chest tube on 11/14 and have applied lytics for management of loculated effusion -Pulmonology consultation for recommendations: Chest Tube to Waterseal with consideration of CT removal tomorrow if Cx Negative -Follow-up pleural fluid culture, pathology; Overnight Provider received a call overnight on 11/17/2023 regarding her pleural fluid in the body fluid cell count with differential results was reading as a typical number of immature mononuclear  cells.  This was sent to pathology and flow cytometry recommended but and available in house -Stopped Ceftriaxone /Flagyl now.   -Repeat CXR 11/18: Interval removal of right basilar chest tube with unchanged loculated pneumothorax at the right costophrenic angle. Stable small bilateral pleural effusions and bibasilar atelectasis. -Patient had his chest tube removed and had no complications or complaints afterwards.  Pulmonary signed off the case on 11/19/2023   Diabetes mellitus type 2: Uncontrolled with hyperglycemia based on last hemoglobin A1c of 8.3%. Changing Very Sensitive Novolog  SSI TIDwm to Sensitive Novolog  SSI AC/HS. CTM CBGs per Protocol. CBG Trend:  Recent Labs  Lab 11/20/23 1155 11/20/23 1613 11/20/23 2005 11/21/23 0559 11/21/23 0835 11/21/23 1150 11/21/23 1614  GLUCAP 157* 179* 175* 133* 211* 138* 110*    CAD: S/p CABG x4.  Resumed aspirin  81 mg p.o. daily  Nausea and Vomiting: Initiate antiemetics with IV ondansetron  and his nausea vomiting is improved.   Aortic atherosclerosis: Noted on CT imaging.   Aortic aneurysm: Stable.  Hypophosphatemia: Phos Level is now 2.8. CTM and Replete as necessary. Repeat Phos Level in the AM   Chronic HFpEF: Currently stable. Strict I's and O's and Daily Weights. CTM for S/Sx of Volume Overload.  Intake/Output Summary (Last 24 hours) at 11/21/2023 1736 Last data filed at 11/21/2023 9491 Gross per 24 hour  Intake 480 ml  Output 850 ml  Net -370 ml   Peripheral vascular disease Noted. Not on medication therapy but resume ASA 81 mg po Daily   Ambulatory dysfunction: Noted. PT/OT recommendations: SNF at discharge however  patient has been mental capacity to decline SNF based on psychiatric evaluation.  Unfortunately also his insurance authorization has been denied and patient does not want to go to SNF.  Likely will go home with maxed home health.    Left foot wound: Noted. Will consult WOC Nurse   Generalized Anxiety Disorder and  Depression: C/w Paroxetine  20 mg po daily, Mirtazepine 15 mg po at bedtime; Also on Quetiapine  25 mg po at bedtime  Cognitive dysfunction: Noted. Continue Quetiapine  25 mg po Daily   Hypoalbuminemia: Patient's Albumin Trend: Recent Labs  Lab 11/12/23 2202 11/14/23 0512 11/16/23 0419 11/18/23 0345 11/19/23 0514 11/20/23 0523 11/21/23 0515  ALBUMIN 2.2* 1.9* 2.1* 1.8* 2.0* 2.0* 1.9*  -Continue to Monitor and Trend and repeat CMP in the AM   DVT prophylaxis: Place and maintain sequential compression device Start: 11/20/23 1505 SCDs Start: 11/13/23 0733 apixaban  (ELIQUIS ) tablet 5 mg    Code Status: Full Code Family Communication: D/w Son over the telephone  Disposition Plan:  Level of care: Telemetry Status is: Inpatient Remains inpatient appropriate because: Medically stable for D/C but Needs a safe discharge disposition and unfortunately was denied for SNF and no longer wants to go to SNF.  Will need to figure out with [facility] if he can go home safely with Home Health   Consultants:  Pulmonary  Urology Psychiatry Gastroenterology  Procedures:  As delineated as above.   Antimicrobials:  Anti-infectives (From admission, onward)    Start     Dose/Rate Route Frequency Ordered Stop   11/16/23 1315  sulfamethoxazole -trimethoprim  (BACTRIM  DS) 800-160 MG per tablet 1 tablet        1 tablet Oral Every 12 hours 11/16/23 1202 11/22/23 2359   11/14/23 1630  metroNIDAZOLE  (FLAGYL ) IVPB 500 mg  Status:  Discontinued        500 mg 100 mL/hr over 60 Minutes Intravenous Every 8 hours 11/14/23 1534 11/20/23 1606   11/14/23 0200  cefTRIAXone  (ROCEPHIN ) 1 g in sodium chloride  0.9 % 100 mL IVPB  Status:  Discontinued        1 g 200 mL/hr over 30 Minutes Intravenous Every 24 hours 11/13/23 0821 11/20/23 1606   11/13/23 0215  cefTRIAXone  (ROCEPHIN ) 1 g in sodium chloride  0.9 % 100 mL IVPB        1 g 200 mL/hr over 30 Minutes Intravenous  Once 11/13/23 0208 11/13/23 0411        Subjective: Seen and examined at bedside and he is doing fairly well.  Had no complaints denied any nausea or vomiting.  States that he does not want to go to SNF and that his peer to peer for SNF is denied.  Wants to go home but son does not think he can come stay with him.  He is unsafe to go home by himself.  He denies any lightheadedness or dizziness.  No other concerns or complaints at this time. Psychiatry was consulted for capacity evaluation and he has the capacity to decline SNF.  Objective: Vitals:   11/21/23 0412 11/21/23 0503 11/21/23 0719 11/21/23 1506  BP: (!) 147/80 (!) 149/79 (!) 141/62 (!) 164/79  Pulse: (!) 51 (!) 57 (!) 56 (!) 57  Resp: 17 16 16 16   Temp: 98.3 F (36.8 C) 98.4 F (36.9 C) 98.3 F (36.8 C) 98.2 F (36.8 C)  TempSrc:  Oral    SpO2: 100% 100% 98% 99%  Weight:      Height:        Intake/Output Summary (  Last 24 hours) at 11/21/2023 1745 Last data filed at 11/21/2023 9491 Gross per 24 hour  Intake 480 ml  Output 850 ml  Net -370 ml   Filed Weights   11/12/23 2154  Weight: 68 kg   Examination: Physical Exam:  Constitutional: Chronically ill-appearing Caucasian male in no acute distress resting Respiratory: Diminished to auscultation bilaterally, no wheezing, rales, rhonchi or crackles. Normal respiratory effort and patient is not tachypenic. No accessory muscle use.  Unlabored breathing and chest tube is been removed Cardiovascular: RRR, no murmurs / rubs / gallops. S1 and S2 auscultated. No extremity edema.  Abdomen: Soft, non-tender, non-distended. Bowel sounds positive.  GU: Deferred.  Foley catheter is in place Musculoskeletal: No clubbing / cyanosis of digits/nails. No joint deformity upper and lower extremities.  Skin: No rashes, lesions, ulcers. No induration; Warm and dry.  Neurologic: CN 2-12 grossly intact with no focal deficits. Romberg sign and cerebellar reflexes not assessed.  Psychiatric: Awake and alert  Data Reviewed: I have  personally reviewed following labs and imaging studies  CBC: Recent Labs  Lab 11/17/23 0651 11/18/23 0345 11/19/23 0514 11/20/23 0523 11/21/23 0515  WBC 4.9 6.2 6.1 5.2 5.2  NEUTROABS  --  4.4 4.4 3.5 3.5  HGB 8.0* 8.1* 8.2* 8.7* 8.1*  HCT 25.5* 25.3* 25.9* 27.1* 26.1*  MCV 87.6 86.6 86.9 88.0 88.8  PLT 209 216 230 213 204   Basic Metabolic Panel: Recent Labs  Lab 11/17/23 0651 11/18/23 0345 11/19/23 0514 11/20/23 0523 11/21/23 0515  NA 137 135 135 138 134*  K 4.1 4.4 4.3 4.4 4.3  CL 104 101 102 105 101  CO2 26 25 23 22 25   GLUCOSE 210* 144* 132* 121* 138*  BUN 18 14 11 11 10   CREATININE 0.74 0.70 0.80 0.85 0.82  CALCIUM  7.6* 7.5* 7.8* 7.8* 7.8*  MG  --  2.0 1.9 1.9 1.8  PHOS  --  2.4* 2.6 2.9 2.8   GFR: Estimated Creatinine Clearance: 80.6 mL/min (by C-G formula based on SCr of 0.82 mg/dL). Liver Function Tests: Recent Labs  Lab 11/16/23 0419 11/18/23 0345 11/19/23 0514 11/20/23 0523 11/21/23 0515  AST  --  18 18 19 15   ALT  --  16 14 13 12   ALKPHOS  --  136* 127* 131* 129*  BILITOT  --  0.5 0.9 0.6 0.6  PROT 6.1* 5.6* 5.8* 5.8* 5.5*  ALBUMIN 2.1* 1.8* 2.0* 2.0* 1.9*   No results for input(s): LIPASE, AMYLASE in the last 168 hours. No results for input(s): AMMONIA in the last 168 hours. Coagulation Profile: No results for input(s): INR, PROTIME in the last 168 hours. Cardiac Enzymes: No results for input(s): CKTOTAL, CKMB, CKMBINDEX, TROPONINI in the last 168 hours. BNP (last 3 results) Recent Labs    03/01/23 1157 08/18/23 1610  PROBNP 399.0* 3,796.0*   HbA1C: No results for input(s): HGBA1C in the last 72 hours. CBG: Recent Labs  Lab 11/20/23 2005 11/21/23 0559 11/21/23 0835 11/21/23 1150 11/21/23 1614  GLUCAP 175* 133* 211* 138* 110*   Lipid Profile: No results for input(s): CHOL, HDL, LDLCALC, TRIG, CHOLHDL, LDLDIRECT in the last 72 hours. Thyroid  Function Tests: No results for input(s): TSH,  T4TOTAL, FREET4, T3FREE, THYROIDAB in the last 72 hours. Anemia Panel: No results for input(s): VITAMINB12, FOLATE, FERRITIN, TIBC, IRON , RETICCTPCT in the last 72 hours. Sepsis Labs: No results for input(s): PROCALCITON, LATICACIDVEN in the last 168 hours.  Recent Results (from the past 240 hours)  Urine Culture  Status: Abnormal   Collection Time: 11/13/23  1:10 AM   Specimen: Urine, Catheterized  Result Value Ref Range Status   Specimen Description URINE, CATHETERIZED  Final   Special Requests   Final    NONE Performed at [facility], [address], [place], [place] [address]    Culture 20,000 COLONIES/mL STAPHYLOCOCCUS HAEMOLYTICUS (A)  Final   Report Status 11/15/2023 FINAL  Final   Organism ID, Bacteria STAPHYLOCOCCUS HAEMOLYTICUS (A)  Final      Susceptibility   Staphylococcus haemolyticus - MIC*    CIPROFLOXACIN >=8 RESISTANT Resistant     GENTAMICIN <=0.5 SENSITIVE Sensitive     NITROFURANTOIN <=16 SENSITIVE Sensitive     OXACILLIN >=4 RESISTANT Resistant     TETRACYCLINE <=1 SENSITIVE Sensitive     VANCOMYCIN  <=0.5 SENSITIVE Sensitive     TRIMETH /SULFA  <=10 SENSITIVE Sensitive     RIFAMPIN <=0.5 SENSITIVE Sensitive     Inducible Clindamycin POSITIVE Resistant     * 20,000 COLONIES/mL STAPHYLOCOCCUS HAEMOLYTICUS  MRSA Next Gen by PCR, Nasal     Status: None   Collection Time: 11/13/23  4:02 PM   Specimen: Nasal Mucosa; Nasal Swab  Result Value Ref Range Status   MRSA by PCR Next Gen NOT DETECTED NOT DETECTED Final    Comment: (NOTE) The GeneXpert MRSA Assay (FDA approved for NASAL specimens only), is one component of a comprehensive MRSA colonization surveillance program. It is not intended to diagnose MRSA infection nor to guide or monitor treatment for MRSA infections. Test performance is not FDA approved in patients less than 52 years old. Performed at [facility], [address], [place], [place] [address]   Body  fluid culture w Gram Stain     Status: None   Collection Time: 11/15/23  5:29 PM   Specimen: Pleural Fluid  Result Value Ref Range Status   Specimen Description PLEURAL  Final   Special Requests RIGHT  Final   Gram Stain NO WBC SEEN NO ORGANISMS SEEN   Final   Culture   Final    NO GROWTH 3 DAYS Performed at [facility], [address] [place], [place] [address]    Report Status 11/18/2023 FINAL  Final  Fungus Culture With Stain     Status: None (Preliminary result)   Collection Time: 11/15/23  5:30 PM   Specimen: Pleural, Right; Pleural Fluid  Result Value Ref Range Status   Fungus Stain Final report  Final    Comment: (NOTE) Performed At: [facility] [address] [place], [place] [id number] Jennette Shorter MD Ey:[id number]    Fungus (Mycology) Culture PENDING  Incomplete   Fungal Source RIGHT PLEURAL  Final    Comment: Performed at [facility], [address] [place], [place] [address]  Fungus Culture Result     Status: None   Collection Time: 11/15/23  5:30 PM  Result Value Ref Range Status   Result 1 Comment  Final    Comment: (NOTE) KOH/Calcofluor preparation:  no fungus observed. Performed At: [facility] [address] [place], [place] [id number] Jennette Shorter MD Ey:[id number]     Radiology Studies: No results found.  Scheduled Meds:  apixaban   5 mg Oral BID   aspirin  EC  81 mg Oral Daily   Chlorhexidine  Gluconate Cloth  6 each Topical Daily   finasteride  5 mg Oral Daily   insulin  aspart  0-5 Units Subcutaneous QHS   insulin  aspart  0-9 Units Subcutaneous  TID WC   mirtazapine   15 mg Oral QHS   PARoxetine   20 mg Oral Daily   polyethylene glycol  17 g Oral BID   QUEtiapine   25 mg Oral QHS   senna-docusate  1 tablet Oral BID   sodium chloride  flush  10 mL Intrapleural Q8H   sulfamethoxazole -trimethoprim   1 tablet Oral Q12H   Continuous Infusions:  sodium chloride  irrigation      LOS: 8 days   Alejandro Marker, DO [facility] Available via Epic secure chat 7am-7pm After these hours, please refer to coverage provider listed on [email or web address] 11/21/2023, 5:45 PM

## 2023-11-22 ENCOUNTER — Inpatient Hospital Stay (HOSPITAL_COMMUNITY)

## 2023-11-22 DIAGNOSIS — R195 Other fecal abnormalities: Secondary | ICD-10-CM

## 2023-11-22 DIAGNOSIS — Z7901 Long term (current) use of anticoagulants: Secondary | ICD-10-CM | POA: Diagnosis not present

## 2023-11-22 DIAGNOSIS — J869 Pyothorax without fistula: Secondary | ICD-10-CM | POA: Diagnosis not present

## 2023-11-22 DIAGNOSIS — R319 Hematuria, unspecified: Secondary | ICD-10-CM | POA: Diagnosis not present

## 2023-11-22 DIAGNOSIS — D509 Iron deficiency anemia, unspecified: Secondary | ICD-10-CM | POA: Diagnosis not present

## 2023-11-22 DIAGNOSIS — R310 Gross hematuria: Secondary | ICD-10-CM | POA: Diagnosis not present

## 2023-11-22 LAB — CBC WITH DIFFERENTIAL/PLATELET
Abs Immature Granulocytes: 0.03 K/uL (ref 0.00–0.07)
Basophils Absolute: 0 K/uL (ref 0.0–0.1)
Basophils Relative: 1 %
Eosinophils Absolute: 0.1 K/uL (ref 0.0–0.5)
Eosinophils Relative: 3 %
HCT: 24.7 % — ABNORMAL LOW (ref 39.0–52.0)
Hemoglobin: 7.8 g/dL — ABNORMAL LOW (ref 13.0–17.0)
Immature Granulocytes: 1 %
Lymphocytes Relative: 17 %
Lymphs Abs: 0.9 K/uL (ref 0.7–4.0)
MCH: 27.8 pg (ref 26.0–34.0)
MCHC: 31.6 g/dL (ref 30.0–36.0)
MCV: 87.9 fL (ref 80.0–100.0)
Monocytes Absolute: 0.6 K/uL (ref 0.1–1.0)
Monocytes Relative: 11 %
Neutro Abs: 3.6 K/uL (ref 1.7–7.7)
Neutrophils Relative %: 67 %
Platelets: 196 K/uL (ref 150–400)
RBC: 2.81 MIL/uL — ABNORMAL LOW (ref 4.22–5.81)
RDW: 17 % — ABNORMAL HIGH (ref 11.5–15.5)
WBC: 5.2 K/uL (ref 4.0–10.5)
nRBC: 0 % (ref 0.0–0.2)

## 2023-11-22 LAB — PHOSPHORUS: Phosphorus: 3.2 mg/dL (ref 2.5–4.6)

## 2023-11-22 LAB — GLUCOSE, CAPILLARY
Glucose-Capillary: 226 mg/dL — ABNORMAL HIGH (ref 70–99)
Glucose-Capillary: 240 mg/dL — ABNORMAL HIGH (ref 70–99)
Glucose-Capillary: 187 mg/dL — ABNORMAL HIGH (ref 70–99)
Glucose-Capillary: 116 mg/dL — ABNORMAL HIGH (ref 70–99)

## 2023-11-22 LAB — COMPREHENSIVE METABOLIC PANEL WITH GFR
ALT: 12 U/L (ref 0–44)
AST: 17 U/L (ref 15–41)
Albumin: 1.9 g/dL — ABNORMAL LOW (ref 3.5–5.0)
Alkaline Phosphatase: 132 U/L — ABNORMAL HIGH (ref 38–126)
Anion gap: 11 (ref 5–15)
BUN: 12 mg/dL (ref 8–23)
CO2: 24 mmol/L (ref 22–32)
Calcium: 7.7 mg/dL — ABNORMAL LOW (ref 8.9–10.3)
Chloride: 100 mmol/L (ref 98–111)
Creatinine, Ser: 0.7 mg/dL (ref 0.61–1.24)
GFR, Estimated: >60 mL/min (ref >60)
Glucose, Bld: 246 mg/dL — ABNORMAL HIGH (ref 70–99)
Potassium: 4.4 mmol/L (ref 3.5–5.1)
Sodium: 135 mmol/L (ref 135–145)
Total Bilirubin: 0.3 mg/dL (ref 0.0–1.2)
Total Protein: 5.6 g/dL — ABNORMAL LOW (ref 6.5–8.1)

## 2023-11-22 LAB — MAGNESIUM: Magnesium: 2.2 mg/dL (ref 1.7–2.4)

## 2023-11-22 MED ORDER — IOHEXOL 350 MG/ML SOLN
75.0000 mL | Freq: Once | INTRAVENOUS | Status: AC | PRN
  Administered 2023-11-22: 75 mL via INTRAVENOUS

## 2023-11-22 MED ORDER — COLLAGENASE 250 UNIT/GM EX OINT
1.0000 | TOPICAL_OINTMENT | Freq: Every day | CUTANEOUS | Status: DC
  Administered 2023-11-22 – 2023-12-23 (×31): 1 via TOPICAL
  Filled 2023-11-22 (×2): qty 30

## 2023-11-22 MED ORDER — HEPARIN (PORCINE) 25000 UT/250ML-% IV SOLN
1000.0000 [IU]/h | INTRAVENOUS | Status: DC
  Administered 2023-11-22 – 2023-11-23 (×2): 1000 [IU]/h via INTRAVENOUS
  Filled 2023-11-22 (×2): qty 250

## 2023-11-22 NOTE — Progress Notes (Addendum)
 Patient ID: [name] [name], male   DOB: 1955-08-28, 68 y.o.   MRN: [id number]    Progress Note   Subjective   Day # 9  CC: Patient was seen earlier this admission by GI service for iron  deficiency anemia which has been chronic.  He declined EGD and colonoscopy when seen previously this admission.Dr. Leigh had also been trying to arrange for outpatient endoscopy and colonoscopy for sometime with several cancellations.  We are asked to reevaluate today as he is now amenable to proceeding with endoscopic evaluation. Patient has been treated for acute kidney injury and gross hematuria during this admission, also noted Hemoccult positive. Patient developed a right pleural effusion, required chest tube for drainage which has since been discontinued Reinitiated on Eliquis  yesterday, now on hold and to restart heparin   Labs today WBC 5.2/hemoglobin 7.8/hematocrit 24.7-stable over the past week Potassium 4.4/BUN 12/creatinine 0.70 LFTs normal with exception of alk phos at 132  Recent iron  studies showed serum iron  22/iron  sat 11/TIBC 204/ferritin 172   Objective   Vital signs in last 24 hours: Temp:  [98 F (36.7 C)-98.9 F (37.2 C)] 98.7 F (37.1 C) (11/21 0756) Pulse Rate:  [54-60] 59 (11/21 0756) Resp:  [16-18] 18 (11/21 0756) BP: (128-164)/(67-79) 128/67 (11/21 0756) SpO2:  [98 %-99 %] 98 % (11/21 0756) Last BM Date : 11/21/23 General:    older WM in NAD Heart:  Regular rate and rhythm; no murmurs Lungs: Respirations even and unlabored, lungs CTA bilaterally Abdomen:  Soft, nontender and nondistended. Normal bowel sounds.  Foley in place, urine light orange Extremities:  Without edema. Neurologic:  Alert and oriented,  grossly normal neurologically. Psych:  Cooperative. Normal mood and affect.  Intake/Output from previous day: 11/20 0701 - 11/21 0700 In: 240 [P.O.:240] Out: 300 [Urine:300] Intake/Output this shift: No intake/output data recorded.  Lab Results: Recent  Labs    11/20/23 0523 11/21/23 0515 11/22/23 0412  WBC 5.2 5.2 5.2  HGB 8.7* 8.1* 7.8*  HCT 27.1* 26.1* 24.7*  PLT 213 204 196   BMET Recent Labs    11/20/23 0523 11/21/23 0515 11/22/23 0412  NA 138 134* 135  K 4.4 4.3 4.4  CL 105 101 100  CO2 22 25 24   GLUCOSE 121* 138* 246*  BUN 11 10 12   CREATININE 0.85 0.82 0.70  CALCIUM  7.8* 7.8* 7.7*   LFT Recent Labs    11/22/23 0412  PROT 5.6*  ALBUMIN 1.9*  AST 17  ALT 12  ALKPHOS 132*  BILITOT 0.3   PT/INR No results for input(s): LABPROT, INR in the last 72 hours.       Assessment / Plan:    #4 68 year old white male with history of atrial fibrillation, coronary artery disease, status post CABG x 4, status post aortic aneurysm repair, and with history of CVA, diabetes mellitus and congestive heart failure with preserved EF. Admitted after abnormal outpatient labs and noted to have gross hematuria, anemia and acute kidney injury.  GI had consulted earlier this admission for anemia and heme positive stool and previously documented iron  deficiency.  To his other acute issues he did not want to proceed with endoscopic evaluation.  He has since had urologic evaluation, and drainage of a right pleural effusion, eventual plan is home apparently with home health.  Eliquis  reinitiated yesterday, now on hold today and heparin  started  Discussed proceeding with colonoscopy and EGD during this admission with the patient.  He is now agreeable.  I explained that we would  need Eliquis  washout today and tomorrow and earliest procedure would be Sunday if not Monday. He is agreeable to proceed, I think due to his other comorbidities he will be best served trying to accomplish prep etc. while in the hospital.  He has had problems with chronic fecal incontinence.  #2 anemia, heme positive stool-hemoglobin stable at 7.8-continue to monitor  Plan; start clear liquids tomorrow Holding Eliquis , on heparin  now which will need to be  stopped 4 to 6 hours prior to procedures Tentative plan for colonoscopy and EGD on Sunday with Dr. San with bowel prep Saturday evening. Colonoscopy and EGD both discussed in detail with the patient including indications risks and benefits and he is agreeable to proceed     Principal Problem:   Hematuria Active Problems:   Iron  deficiency anemia   Occult blood in stools   Anticoagulated   Pleural effusion on right   Empyema (HCC)   Pleural effusion     LOS: 9 days   Amy EsterwoodPA-C  11/22/2023, 1:45 PM   Attending physician's note   I have taken a history, reviewed the chart, and examined the patient. I performed a substantive portion of this encounter, including complete performance of at least one of the key components, in conjunction with the APP. I agree with the APP's note, impression, and recommendations with my edits.   GI service reconsulted to reengage for evaluation of iron  deficiency anemia and heme positive stool as patient now amenable to endoscopic procedures.  Hospital course reviewed and outlined as above.  Hemoglobin nadir was 6.7 on 11/13 and received 1 unit with posttransfusion hemoglobin 8.5.  Outpatient baseline appears to be ~8-9.  He had canceled outpatient endoscopic procedures several times, and prevailing thought was that it may be difficult for him to get these done as an outpatient, so this was offered as inpatient.  He had previously declined, but now seems to be amenable on my conversation with him today.   H/H with slight downtrend over the last 48 hours with hemoglobin 8.7 --> 8.1 --> 7.8.  No overt bleeding.  - Change diet to CLD - Eliquis  already on hold - Plan to hold heparin  4 hours prior to procedures - Tentative plan for bowel prep tomorrow with EGD/colonoscopy on 11/23 - GI service will remain available as needed in the interim  The indications, risks, and benefits of EGD and colonoscopy were explained to the patient in detail.  Risks include but are not limited to bleeding, perforation, adverse reaction to medications, and cardiopulmonary compromise. Sequelae include but are not limited to the possibility of surgery, hospitalization, and mortality. The patient verbalized understanding and wished to proceed. All questions answered.     [address], DO, FACG [phone] office

## 2023-11-22 NOTE — Progress Notes (Signed)
 PROGRESS NOTE    [name] [name]  FMW:[id number] DOB: 1955/11/25 DOA: 11/12/2023 PCP: [name] HERO, PA-C   Brief Narrative:  Quinlan Vollmer is a 68 y.o. male with a history of atrial fibrillation with slow ventricular response, CAD, CABG x 4, aortic aneurysm s/p repair, CVA, diabetes mellitus type 2, heart failure with preserved EF.  Patient presented secondary to abnormal metabolic panel and while in the emergency department found to have evidence of gross hematuria, anemia and AKI.  Urology consulted.  Patient started IV fluids.  During workup, patient was Hemoccult positive, GI consulted.  Hemoglobin continued to drift down requiring 1 unit PRBC via transfusion.  CT imaging on admission was significant for right pleural effusion concerning for possible empyema.  Pulmonology consulted for chest tube placement which was performed on 11/14 and this was subsequently removed 11/18/2023.    Pulmonary signed off the case. Ambulatory Home O2 Screen done and he did not desaturate. TOV attempted but he failed so Foley reinserted and will continue.  After discussion with urology and GI anticoagulation was resumed however now will be changing to heparin  drip.  Appeared improved and medically stable for SNF however his insurance authorization was denied despite peer to peer.  Now patient is agreeable for further workup as below psychiatry was consulted for capacity evaluation patient has the capacity to decline SNF.    Patient now agreeable to getting an EGD and colonoscopy so no longer medically stable for discharge and anticoagulation has been held and GI has been reconsulted.  Plan is to start clear liquid tomorrow and tentatively planning for colonoscopy and EGD on Sunday.  Will need further evaluation and safe discharge disposition.   Assessment and Plan:  Gross Hematuria: Possibly related to foley catheter trauma and complicated by Eliquis  use. Urology consulted. CT abdomen/pelvis  significant for normal kidneys without hydronephrosis, nonobstructing stone versus vascular calcification of the upper pole left kidney, no renal mass.  Hematuria catheter was declined by patient.  Per urology, possible hemorrhagic cystitis. Hematuria catheter placed and CBI started on 11/14. Urine culture positive for 20,000 colonies/ml of staphylococcus hemolyticus. -Urology recommendations: Treat for possible UTI, continue Foley with continued attempts for replacement of catheter without hematuria catheter, trend H&H as below; urology team was recommending continuing catheter for this 1 to 2 days and then consider a trial of void as his GU bleeding subsided.  They also recommended continuing finasteride  at discharge to try and reduce the prostate source of bleeding.  Trial of void failed so had to be reinserted.  Restarted on his home anticoagulation but will be held again given IDA and Heme + Stools requiring EGD/Colon  Possible UTI: Urine culture obtained. Patient started empirically on Ceftriaxone  and now will stop. Urinalysis with rare bacteruria, pyuria and is nitrite positive; significant squamous cells. Urine culture positive for 20,000 colonies/ml of staphylococcus hemolyticus. Started Bactrim  in addition to Abx as below but Ceftriaxone  / Flagyl  stopped and Bactrim  to be stopped later today    Acute Blood Loss Anemia / Chronic Iron  Deficiency Anemia: Baseline hemoglobin appears to be around 8-9.  Hemoglobin of 7.5 on admission with downtrend to 6.7 likely related to patient's gross hematuria in addition to heme positive stool.  1 unit of PRBC ordered on 11/13 with posttransfusion hemoglobin up to 8.5.  Hemoglobin/Hct now stable at current trend:  Recent Labs  Lab 11/16/23 0419 11/17/23 0651 11/18/23 0345 11/19/23 0514 11/20/23 0523 11/21/23 0515 11/22/23 0412  HGB 8.4* 8.0* 8.1* 8.2* 8.7* 8.1*  7.8*  HCT 26.3* 25.5* 25.3* 25.9* 27.1* 26.1* 24.7*  MCV 86.5 87.6 86.6 86.9 88.0 88.8 87.9   -CTM for S/Sx of Bleeding; has been resumed on apixaban  however will hold in place of heparin  drip given that he is agreeable to getting EGD and colon repeat CBC in the AM -GI reconsulted and planning on starting him on clear liquids on 11/23/2023 and tentative plan is for colonoscopy and EGD on Sunday with Dr. San with bowel prep starting Saturday evening   AKI: Present on admission with creatinine of 1.48 and peak of 1.50. Resolved. BUN/Cr is now 12/0.70. Avoid Nephrotoxic Medications, Contrast Dyes, Hypotension and Dehydration to Ensure Adequate Renal Perfusion and will need to Renally Adjust Meds. CTM & Trend Renal Function carefully & repeat CMP in the AM   Heme Positive stool: No history of hematochezia or melena.  BUN significantly elevated on admission, but patient also has evidence of dehydration.  Complicated by Eliquis  use. GI consulted and have recommended inpatient EGD/Colonoscopy. Patient continued to adamantly decline endoscopy procedures but now agreeable.  Diet was advanced and now on Soft diet. Was placed back on [facility] w/ Eliquis  but will hold now and place on Heparin  gtt.   Paroxysmal atrial fibrillation with slow ventricular response: Patient is not on rate control medication secondary to slow ventricular response.  He is managed on Eliquis  secondary to a CHA2DS2-VASc of 4.  Eliquis  was held secondary to hematuria and associated acute anemia.  After further discussion with GI and urology they recommended resuming 11/21/2023 but now will be held again as patient is agreeable to EGD and Colonoscopy    Right pleural effusion: Noted on CT imaging with evidence of moderate-sized collection.  Concern for possible empyema secondary to gas seen on imaging.  Last thoracentesis was from February 2025, which was also right-sided and was secondary to heart failure.  Patient is completely asymptomatic.  Pulmonology consulted and placed a right-sided chest tube on 11/14 and have applied lytics  for management of loculated effusion -Pulmonology consultation for recommendations:  -Follow-up pleural fluid culture, pathology; Overnight Provider received a call overnight on 11/17/2023 regarding her pleural fluid in the body fluid cell count with differential results was reading as a typical number of immature mononuclear cells.  This was sent to pathology and flow cytometry recommended but and available in house -Stopped Ceftriaxone /Flagyl  now.   -Repeat CXR 11/18: Interval removal of right basilar chest tube with unchanged loculated pneumothorax at the right costophrenic angle. Stable small bilateral pleural effusions and bibasilar atelectasis. -Patient had his chest tube removed and had no complications or complaints afterwards.  Pulmonary signed off the case on 11/19/2023   Diabetes mellitus type 2: Uncontrolled with hyperglycemia based on last hemoglobin A1c of 8.3%. Changing Very Sensitive Novolog  SSI TIDwm to Sensitive Novolog  SSI AC/HS. CTM CBGs per Protocol. CBG Trend:  Recent Labs  Lab 11/21/23 0835 11/21/23 1150 11/21/23 1614 11/21/23 2111 11/22/23 0623 11/22/23 1155 11/22/23 1636  GLUCAP 211* 138* 110* 246* 226* 240* 187*    CAD: S/p CABG x4.  Resumed aspirin  81 mg p.o. daily but will hold again given that patient o undergo EGD and Colonoscopy  Nausea and Vomiting: Initiated antiemetics with IV ondansetron  and his nausea vomiting is improved.   Aortic atherosclerosis: Noted on CT imaging.   Aortic aneurysm: Stable.  Hypophosphatemia: Phos Level is now 3.2. CTM and Replete as necessary. Repeat Phos Level in the AM   Chronic HFpEF: Currently stable. Strict I's and O's and Daily  Weights. CTM for S/Sx of Volume Overload.  Intake/Output Summary (Last 24 hours) at 11/22/2023 1640 Last data filed at 11/22/2023 0700 Gross per 24 hour  Intake 240 ml  Output 300 ml  Net -60 ml   Peripheral vascular disease Noted. Not on medication therapy but resume ASA 81 mg po Daily    Ambulatory dysfunction: Noted. PT/OT recommendations: SNF at discharge however patient has been mental capacity to decline SNF based on psychiatric evaluation.  Unfortunately also his insurance authorization has been denied and patient does not want to go to SNF. Now no longer stable to D/C given that he is undergoing EGD/Colon and obtaining further evaluation for left foot wound   Left foot wound: Consulted WOC Nurse and seems to have worsened so we will obtain imaging. -Wound care is recommending cleansing the wound with Vashe and allowed to air dry and applying a 1/4 inch thick layer of Medihoney to the wound bed and top with dry gauze and silicone foam and changing daily. -CT Scan showed cutaneous thickening and irregularity corresponded to wound along the lateral and plantar forefoot, overlying the level of the distal fifth metatarsal. No appreciable osteolysis or erosive changes of the underlying fifth metatarsal to suggest acute osteomyelitis, No loculated fluid collection, and Diffusely heterogenous appearance of the bones with decreased osseous mineralization. -C/w Bactrim  as above     Generalized Anxiety Disorder and Depression: C/w Paroxetine  20 mg po daily, Mirtazepine 15 mg po at bedtime; Also on Quetiapine  25 mg po at bedtime  Cognitive dysfunction: Noted. Continue Quetiapine  25 mg po Daily   Hypoalbuminemia: Patient's Albumin Trend: Recent Labs  Lab 11/14/23 0512 11/16/23 0419 11/18/23 0345 11/19/23 0514 11/20/23 0523 11/21/23 0515 11/22/23 0412  ALBUMIN 1.9* 2.1* 1.8* 2.0* 2.0* 1.9* 1.9*  -Continue to Monitor and Trend and repeat CMP in the AM   DVT prophylaxis: Place and maintain sequential compression device Start: 11/20/23 1505 SCDs Start: 11/13/23 9266    Code Status: Full Code Family Communication: No family present @ bedside  Disposition Plan:  Level of care: Telemetry Status is: Inpatient Remains inpatient appropriate because: Now undergoing EGD and Colon;  Needs Safe D/C Disposition   Consultants:  Pulmonary  Urology Psychiatry Gastroenterology  Procedures:  As delineated as above  Antimicrobials:  Anti-infectives (From admission, onward)    Start     Dose/Rate Route Frequency Ordered Stop   11/16/23 1315  sulfamethoxazole -trimethoprim  (BACTRIM  DS) 800-160 MG per tablet 1 tablet        1 tablet Oral Every 12 hours 11/16/23 1202 11/22/23 2359   11/14/23 1630  metroNIDAZOLE  (FLAGYL ) IVPB 500 mg  Status:  Discontinued        500 mg 100 mL/hr over 60 Minutes Intravenous Every 8 hours 11/14/23 1534 11/20/23 1606   11/14/23 0200  cefTRIAXone  (ROCEPHIN ) 1 g in sodium chloride  0.9 % 100 mL IVPB  Status:  Discontinued        1 g 200 mL/hr over 30 Minutes Intravenous Every 24 hours 11/13/23 0821 11/20/23 1606   11/13/23 0215  cefTRIAXone  (ROCEPHIN ) 1 g in sodium chloride  0.9 % 100 mL IVPB        1 g 200 mL/hr over 30 Minutes Intravenous  Once 11/13/23 0208 11/13/23 0411       Subjective: Seen and examined at bedside and was resting and agreeable to doing a EGD and colon after TOC notified me that the patient's son had talked to him last night.  No nausea or vomiting.  Feels okay.  Denies any current complaints.  Still does not want to go to SNF.  GI consulted again and planning on doing EGD and colon likely Sunday.  No other concerns  Objective: Vitals:   11/21/23 1506 11/21/23 1933 11/22/23 0405 11/22/23 0756  BP: (!) 164/79 (!) 145/78 (!) 151/79 128/67  Pulse: (!) 57 60 (!) 54 (!) 59  Resp: 16 16 16 18   Temp: 98.2 F (36.8 C) 98.9 F (37.2 C) 98 F (36.7 C) 98.7 F (37.1 C)  TempSrc:  Oral  Oral  SpO2: 99% 98% 99% 98%  Weight:      Height:        Intake/Output Summary (Last 24 hours) at 11/22/2023 1644 Last data filed at 11/22/2023 0700 Gross per 24 hour  Intake 240 ml  Output 300 ml  Net -60 ml   Filed Weights   11/12/23 2154  Weight: 68 kg   Examination: Physical Exam:  Constitutional: Chronically ill-appearing  Caucasian male in no acute distress.  Resting Respiratory: Mildly diminished to auscultation bilaterally, no wheezing, rales, rhonchi or crackles. Normal respiratory effort and patient is not tachypenic. No accessory muscle use.  Unlabored breathing Cardiovascular: RRR, no murmurs / rubs / gallops. S1 and S2 auscultated. No extremity edema.  Abdomen: Soft, non-tender, non-distended. Bowel sounds positive.  GU: Deferred. Musculoskeletal: No clubbing / cyanosis of digits/nails. No joint deformity upper and lower extremities Skin: No rashes, lesions, ulcers or limited skin evaluation and left foot is wrapped and he did not turn to view sacrum. No induration; Warm and dry.  Neurologic: CN 2-12 grossly intact with no focal deficits. Romberg sign cerebellar reflexes not assessed.  Psychiatric: She is awake and alert but is a little bit confused  Data Reviewed: I have personally reviewed following labs and imaging studies  CBC: Recent Labs  Lab 11/18/23 0345 11/19/23 0514 11/20/23 0523 11/21/23 0515 11/22/23 0412  WBC 6.2 6.1 5.2 5.2 5.2  NEUTROABS 4.4 4.4 3.5 3.5 3.6  HGB 8.1* 8.2* 8.7* 8.1* 7.8*  HCT 25.3* 25.9* 27.1* 26.1* 24.7*  MCV 86.6 86.9 88.0 88.8 87.9  PLT 216 230 213 204 196   Basic Metabolic Panel: Recent Labs  Lab 11/18/23 0345 11/19/23 0514 11/20/23 0523 11/21/23 0515 11/22/23 0412  NA 135 135 138 134* 135  K 4.4 4.3 4.4 4.3 4.4  CL 101 102 105 101 100  CO2 25 23 22 25 24   GLUCOSE 144* 132* 121* 138* 246*  BUN 14 11 11 10 12   CREATININE 0.70 0.80 0.85 0.82 0.70  CALCIUM  7.5* 7.8* 7.8* 7.8* 7.7*  MG 2.0 1.9 1.9 1.8 2.2  PHOS 2.4* 2.6 2.9 2.8 3.2   GFR: Estimated Creatinine Clearance: 82.6 mL/min (by C-G formula based on SCr of 0.7 mg/dL). Liver Function Tests: Recent Labs  Lab 11/18/23 0345 11/19/23 0514 11/20/23 0523 11/21/23 0515 11/22/23 0412  AST 18 18 19 15 17   ALT 16 14 13 12 12   ALKPHOS 136* 127* 131* 129* 132*  BILITOT 0.5 0.9 0.6 0.6 0.3   PROT 5.6* 5.8* 5.8* 5.5* 5.6*  ALBUMIN 1.8* 2.0* 2.0* 1.9* 1.9*   No results for input(s): LIPASE, AMYLASE in the last 168 hours. No results for input(s): AMMONIA in the last 168 hours. Coagulation Profile: No results for input(s): INR, PROTIME in the last 168 hours. Cardiac Enzymes: No results for input(s): CKTOTAL, CKMB, CKMBINDEX, TROPONINI in the last 168 hours. BNP (last 3 results) Recent Labs    03/01/23 1157 08/18/23 1610  PROBNP 399.0* 3,796.0*  HbA1C: No results for input(s): HGBA1C in the last 72 hours. CBG: Recent Labs  Lab 11/21/23 1614 11/21/23 2111 11/22/23 0623 11/22/23 1155 11/22/23 1636  GLUCAP 110* 246* 226* 240* 187*   Lipid Profile: No results for input(s): CHOL, HDL, LDLCALC, TRIG, CHOLHDL, LDLDIRECT in the last 72 hours. Thyroid  Function Tests: No results for input(s): TSH, T4TOTAL, FREET4, T3FREE, THYROIDAB in the last 72 hours. Anemia Panel: No results for input(s): VITAMINB12, FOLATE, FERRITIN, TIBC, IRON , RETICCTPCT in the last 72 hours. Sepsis Labs: No results for input(s): PROCALCITON, LATICACIDVEN in the last 168 hours.  Recent Results (from the past 240 hours)  Urine Culture     Status: Abnormal   Collection Time: 11/13/23  1:10 AM   Specimen: Urine, Catheterized  Result Value Ref Range Status   Specimen Description URINE, CATHETERIZED  Final   Special Requests   Final    NONE Performed at [facility], [address] [place], [place] [address]    Culture 20,000 COLONIES/mL STAPHYLOCOCCUS HAEMOLYTICUS (A)  Final   Report Status 11/15/2023 FINAL  Final   Organism ID, Bacteria STAPHYLOCOCCUS HAEMOLYTICUS (A)  Final      Susceptibility   Staphylococcus haemolyticus - MIC*    CIPROFLOXACIN >=8 RESISTANT Resistant     GENTAMICIN <=0.5 SENSITIVE Sensitive     NITROFURANTOIN <=16 SENSITIVE Sensitive     OXACILLIN >=4 RESISTANT Resistant     TETRACYCLINE <=1 SENSITIVE  Sensitive     VANCOMYCIN  <=0.5 SENSITIVE Sensitive     TRIMETH /SULFA  <=10 SENSITIVE Sensitive     RIFAMPIN <=0.5 SENSITIVE Sensitive     Inducible Clindamycin POSITIVE Resistant     * 20,000 COLONIES/mL STAPHYLOCOCCUS HAEMOLYTICUS  MRSA Next Gen by PCR, Nasal     Status: None   Collection Time: 11/13/23  4:02 PM   Specimen: Nasal Mucosa; Nasal Swab  Result Value Ref Range Status   MRSA by PCR Next Gen NOT DETECTED NOT DETECTED Final    Comment: (NOTE) The GeneXpert MRSA Assay (FDA approved for NASAL specimens only), is one component of a comprehensive MRSA colonization surveillance program. It is not intended to diagnose MRSA infection nor to guide or monitor treatment for MRSA infections. Test performance is not FDA approved in patients less than 27 years old. Performed at [facility], [address] [place], [place] [address]   Body fluid culture w Gram Stain     Status: None   Collection Time: 11/15/23  5:29 PM   Specimen: Pleural Fluid  Result Value Ref Range Status   Specimen Description PLEURAL  Final   Special Requests RIGHT  Final   Gram Stain NO WBC SEEN NO ORGANISMS SEEN   Final   Culture   Final    NO GROWTH 3 DAYS Performed at [facility], [address], [place], [place] [address]    Report Status 11/18/2023 FINAL  Final  Fungus Culture With Stain     Status: None (Preliminary result)   Collection Time: 11/15/23  5:30 PM   Specimen: Pleural, Right; Pleural Fluid  Result Value Ref Range Status   Fungus Stain Final report  Final    Comment: (NOTE) Performed At: [facility] [address] [place], [place] [id number] Jennette Shorter MD Ey:[id number]    Fungus (Mycology) Culture PENDING  Incomplete   Fungal Source RIGHT PLEURAL  Final    Comment: Performed at [facility], [address], [place], [place] [address]  Fungus Culture Result  Status: None   Collection Time: 11/15/23  5:30 PM  Result Value Ref Range Status   Result  1 Comment  Final    Comment: (NOTE) KOH/Calcofluor preparation:  no fungus observed. Performed At: [name] [address] [place], [place] [id number] Jennette Shorter MD Ey:[id number]     Radiology Studies: CT FOOT LEFT W CONTRAST Result Date: 11/22/2023 CLINICAL DATA:  Left foot wound.  Concern for osteomyelitis. EXAM: CT OF THE LOWER LEFT EXTREMITY WITH CONTRAST TECHNIQUE: Multidetector CT imaging of the lower left extremity was performed according to the standard protocol following intravenous contrast administration. RADIATION DOSE REDUCTION: This exam was performed according to the departmental dose-optimization program which includes automated exposure control, adjustment of the mA and/or kV according to patient size and/or use of iterative reconstruction technique. CONTRAST:  75mL OMNIPAQUE  IOHEXOL  350 MG/ML SOLN COMPARISON:  None Available. FINDINGS: Bones/Joint/Cartilage Cutaneous thickening and irregularity, likely corresponded to wound, along the lateral and plantar forefoot, overlying the level of the distal fifth metatarsal. No appreciable acute osteolysis or erosive changes of the underlying fifth metatarsal to suggest acute osteomyelitis. There is diffusely heterogenous appearance of the bones with decreased osseous mineralization and patchy and confluent lucency. No acute fracture or dislocation. Mild degenerative changes of the midfoot and first MTP joint. Dorsal spurring at the talar neck. Calcaneal enthesopathy at the insertion of the Achilles tendon. Ligaments Ligaments are suboptimally evaluated by CT. Muscles and Tendons Mild atrophy of the intrinsic foot musculature. Soft tissue No loculated fluid collection.  No soft tissue gas. IMPRESSION: 1. Cutaneous thickening and irregularity corresponded to wound along the lateral and plantar forefoot, overlying the level of the distal fifth metatarsal. No appreciable osteolysis or erosive changes of the underlying fifth metatarsal  to suggest acute osteomyelitis. 2. No loculated fluid collection. 3. Diffusely heterogenous appearance of the bones with decreased osseous mineralization. Electronically Signed   By: Harrietta Sherry M.D.   On: 11/22/2023 15:01   Scheduled Meds:  Chlorhexidine  Gluconate Cloth  6 each Topical Daily   collagenase   1 Application Topical Daily   finasteride   5 mg Oral Daily   insulin  aspart  0-5 Units Subcutaneous QHS   insulin  aspart  0-9 Units Subcutaneous TID WC   mirtazapine   15 mg Oral QHS   PARoxetine   20 mg Oral Daily   polyethylene glycol  17 g Oral BID   QUEtiapine   25 mg Oral QHS   senna-docusate  1 tablet Oral BID   sodium chloride  flush  10 mL Intrapleural Q8H   sulfamethoxazole -trimethoprim   1 tablet Oral Q12H   Continuous Infusions:  heparin      sodium chloride  irrigation      LOS: 9 days   Alejandro Marker, DO [facility] Available via Epic secure chat 7am-7pm After these hours, please refer to coverage provider listed on [email or web address] 11/22/2023, 4:44 PM

## 2023-11-22 NOTE — TOC Progression Note (Addendum)
 Transition of Care [facility]) - Progression Note    Patient Details  Name: [name] [name] MRN: [id number] Date of Birth: [birth year]  Transition of Care [facility]) CM/SW Contact  [name], LCSW Phone Number: 11/22/2023, 11:18 AM  Clinical Narrative:   CSW sent/received several messages from pt [name].  [name] spoke with Dr Iantha yesterday and was told that pt not stable due to GI issues.  [name] also reports that his father is now agreeable to GI workup, as prior to this he may have declined to do this.  CSW also informed [name] that pt continues to decline to go to SNF and also had capacity eval that confirmed he has capacity to make this decision.  CSW received denial letter from [facility] that references pt is not stable but also references pt does not have a skilled need at this time and would not benefit from STR.  This letter provided to pt.    CSW accompanied MD to discuss with pt, who agrees to GI workup.    1230: CSW spoke with [name] by phone.  Updated him.  He has spoken with a number of people on long term options including elder law attorney, navigator for location ALF.  LTC at SNF appears to be only viable option with regards to pt being over income for medicaid.  He had some questions as to whether pt declining SNF would be considered AMA discharge.  Pt does have an individual who comes to the home in the evenings several times per week to assist but this is pretty limited hours.  Pt also does have open APS case but there has been little going on with this recently.     Expected Discharge Plan: Skilled Nursing Facility Barriers to Discharge: Continued Medical Work up               Expected Discharge Plan and Services In-house Referral: Clinical Social Work   Post Acute Care Choice: Skilled Nursing Facility Living arrangements for the past 2 months: Single Family Home                                       Social Drivers of Health (SDOH)  Interventions SDOH Screenings   Food Insecurity: No Food Insecurity (11/13/2023)  Recent Concern: Food Insecurity - Food Insecurity Present (08/18/2023)  Housing: Low Risk  (11/13/2023)  Transportation Needs: No Transportation Needs (11/13/2023)  Utilities: Not At Risk (11/13/2023)  Alcohol Screen: Low Risk  (08/19/2023)  Depression (PHQ2-9): Medium Risk (07/26/2023)  Financial Resource Strain: Low Risk  (08/19/2023)  Physical Activity: Inactive (11/21/2022)  Social Connections: Socially Isolated (11/13/2023)  Stress: Stress Concern Present (11/21/2022)  Tobacco Use: High Risk (11/12/2023)  Health Literacy: Adequate Health Literacy (11/21/2022)    Readmission Risk Interventions    08/20/2023    4:20 PM  Readmission Risk Prevention Plan  Transportation Screening Complete  PCP or Specialist Appt within 3-5 Days Complete  HRI or Home Care Consult Complete  Palliative Care Screening Not Applicable  Medication Review (RN Care Manager) Complete

## 2023-11-22 NOTE — H&P (View-Only) (Signed)
 Patient ID: [name] [name], male   DOB: 1955-08-28, 68 y.o.   MRN: [id number]    Progress Note   Subjective   Day # 9  CC: Patient was seen earlier this admission by GI service for iron  deficiency anemia which has been chronic.  He declined EGD and colonoscopy when seen previously this admission.Dr. Leigh had also been trying to arrange for outpatient endoscopy and colonoscopy for sometime with several cancellations.  We are asked to reevaluate today as he is now amenable to proceeding with endoscopic evaluation. Patient has been treated for acute kidney injury and gross hematuria during this admission, also noted Hemoccult positive. Patient developed a right pleural effusion, required chest tube for drainage which has since been discontinued Reinitiated on Eliquis  yesterday, now on hold and to restart heparin   Labs today WBC 5.2/hemoglobin 7.8/hematocrit 24.7-stable over the past week Potassium 4.4/BUN 12/creatinine 0.70 LFTs normal with exception of alk phos at 132  Recent iron  studies showed serum iron  22/iron  sat 11/TIBC 204/ferritin 172   Objective   Vital signs in last 24 hours: Temp:  [98 F (36.7 C)-98.9 F (37.2 C)] 98.7 F (37.1 C) (11/21 0756) Pulse Rate:  [54-60] 59 (11/21 0756) Resp:  [16-18] 18 (11/21 0756) BP: (128-164)/(67-79) 128/67 (11/21 0756) SpO2:  [98 %-99 %] 98 % (11/21 0756) Last BM Date : 11/21/23 General:    older WM in NAD Heart:  Regular rate and rhythm; no murmurs Lungs: Respirations even and unlabored, lungs CTA bilaterally Abdomen:  Soft, nontender and nondistended. Normal bowel sounds.  Foley in place, urine light orange Extremities:  Without edema. Neurologic:  Alert and oriented,  grossly normal neurologically. Psych:  Cooperative. Normal mood and affect.  Intake/Output from previous day: 11/20 0701 - 11/21 0700 In: 240 [P.O.:240] Out: 300 [Urine:300] Intake/Output this shift: No intake/output data recorded.  Lab Results: Recent  Labs    11/20/23 0523 11/21/23 0515 11/22/23 0412  WBC 5.2 5.2 5.2  HGB 8.7* 8.1* 7.8*  HCT 27.1* 26.1* 24.7*  PLT 213 204 196   BMET Recent Labs    11/20/23 0523 11/21/23 0515 11/22/23 0412  NA 138 134* 135  K 4.4 4.3 4.4  CL 105 101 100  CO2 22 25 24   GLUCOSE 121* 138* 246*  BUN 11 10 12   CREATININE 0.85 0.82 0.70  CALCIUM  7.8* 7.8* 7.7*   LFT Recent Labs    11/22/23 0412  PROT 5.6*  ALBUMIN 1.9*  AST 17  ALT 12  ALKPHOS 132*  BILITOT 0.3   PT/INR No results for input(s): LABPROT, INR in the last 72 hours.       Assessment / Plan:    #4 68 year old white male with history of atrial fibrillation, coronary artery disease, status post CABG x 4, status post aortic aneurysm repair, and with history of CVA, diabetes mellitus and congestive heart failure with preserved EF. Admitted after abnormal outpatient labs and noted to have gross hematuria, anemia and acute kidney injury.  GI had consulted earlier this admission for anemia and heme positive stool and previously documented iron  deficiency.  To his other acute issues he did not want to proceed with endoscopic evaluation.  He has since had urologic evaluation, and drainage of a right pleural effusion, eventual plan is home apparently with home health.  Eliquis  reinitiated yesterday, now on hold today and heparin  started  Discussed proceeding with colonoscopy and EGD during this admission with the patient.  He is now agreeable.  I explained that we would  need Eliquis  washout today and tomorrow and earliest procedure would be Sunday if not Monday. He is agreeable to proceed, I think due to his other comorbidities he will be best served trying to accomplish prep etc. while in the hospital.  He has had problems with chronic fecal incontinence.  #2 anemia, heme positive stool-hemoglobin stable at 7.8-continue to monitor  Plan; start clear liquids tomorrow Holding Eliquis , on heparin  now which will need to be  stopped 4 to 6 hours prior to procedures Tentative plan for colonoscopy and EGD on Sunday with Dr. San with bowel prep Saturday evening. Colonoscopy and EGD both discussed in detail with the patient including indications risks and benefits and he is agreeable to proceed     Principal Problem:   Hematuria Active Problems:   Iron  deficiency anemia   Occult blood in stools   Anticoagulated   Pleural effusion on right   Empyema (HCC)   Pleural effusion     LOS: 9 days   Amy EsterwoodPA-C  11/22/2023, 1:45 PM   Attending physician's note   I have taken a history, reviewed the chart, and examined the patient. I performed a substantive portion of this encounter, including complete performance of at least one of the key components, in conjunction with the APP. I agree with the APP's note, impression, and recommendations with my edits.   GI service reconsulted to reengage for evaluation of iron  deficiency anemia and heme positive stool as patient now amenable to endoscopic procedures.  Hospital course reviewed and outlined as above.  Hemoglobin nadir was 6.7 on 11/13 and received 1 unit with posttransfusion hemoglobin 8.5.  Outpatient baseline appears to be ~8-9.  He had canceled outpatient endoscopic procedures several times, and prevailing thought was that it may be difficult for him to get these done as an outpatient, so this was offered as inpatient.  He had previously declined, but now seems to be amenable on my conversation with him today.   H/H with slight downtrend over the last 48 hours with hemoglobin 8.7 --> 8.1 --> 7.8.  No overt bleeding.  - Change diet to CLD - Eliquis  already on hold - Plan to hold heparin  4 hours prior to procedures - Tentative plan for bowel prep tomorrow with EGD/colonoscopy on 11/23 - GI service will remain available as needed in the interim  The indications, risks, and benefits of EGD and colonoscopy were explained to the patient in detail.  Risks include but are not limited to bleeding, perforation, adverse reaction to medications, and cardiopulmonary compromise. Sequelae include but are not limited to the possibility of surgery, hospitalization, and mortality. The patient verbalized understanding and wished to proceed. All questions answered.     [address], DO, FACG [phone] office

## 2023-11-22 NOTE — Progress Notes (Addendum)
 PHARMACY - ANTICOAGULATION CONSULT NOTE  Pharmacy Consult for IV Heparin  Indication: atrial fibrillation  Allergies  Allergen Reactions   Other Hives and Itching    Wool fabric     Patient Measurements: Height: 6' 3 (190.5 cm) Weight: 68 kg (150 lb) IBW/kg (Calculated) : 84.5 HEPARIN  DW (KG): 68  Vital Signs: Temp: 98.7 F (37.1 C) (11/21 0756) Temp Source: Oral (11/21 0756) BP: 128/67 (11/21 0756) Pulse Rate: 59 (11/21 0756)  Labs: Recent Labs    11/20/23 0523 11/21/23 0515 11/22/23 0412  HGB 8.7* 8.1* 7.8*  HCT 27.1* 26.1* 24.7*  PLT 213 204 196  CREATININE 0.85 0.82 0.70    Estimated Creatinine Clearance: 82.6 mL/min (by C-G formula based on SCr of 0.7 mg/dL).   Medical History: Past Medical History:  Diagnosis Date   Acute urinary retention 01/04/2022   Altered mental status 08/18/2023   Anxiety    Arthritis    Cataract    CHF (congestive heart failure) (HCC)    Constipation    pt states goes EOD- hard stools    Coronary artery disease    Depression    Diabetes mellitus without complication (HCC)    Diabetic retinopathy (HCC)    DKA (diabetic ketoacidoses)    Hyperlipidemia    Hypertension    off meds   Hypertensive retinopathy    Lesion of ulnar nerve, left upper limb 07/12/2017   Non-intractable vomiting without nausea 06/10/2018   Postherpetic neuralgia 08/11/2021   Speculative diagnosis   Psoriasis    Tubular adenoma of colon 03/2019   UTI (urinary tract infection) 01/05/2022    Medications:  Scheduled:   Chlorhexidine  Gluconate Cloth  6 each Topical Daily   collagenase   1 Application Topical Daily   finasteride   5 mg Oral Daily   insulin  aspart  0-5 Units Subcutaneous QHS   insulin  aspart  0-9 Units Subcutaneous TID WC   mirtazapine   15 mg Oral QHS   PARoxetine   20 mg Oral Daily   polyethylene glycol  17 g Oral BID   QUEtiapine   25 mg Oral QHS   senna-docusate  1 tablet Oral BID   sodium chloride  flush  10 mL Intrapleural Q8H    sulfamethoxazole -trimethoprim   1 tablet Oral Q12H   Infusions:   sodium chloride  irrigation      Assessment: 68 years of age male with a history of atrial fibrillation (on Apixaban  pta) with slow ventricular response, CAD, CABG x4, aortic aneurysm s/p repair, CVA, DM, and HFpEF who presented with AKI, gross hematuria, and right pleural effusion. Chest tube was placed 11/14 and removed 11/17. GI and urology were consulted. Patient initially refused endoscopy and colonoscopy. CHADS2-VASc score = 4. GI and Urology are ok with resuming anticoagulation. Apixaban  was resumed 11/20 at 5 BID - last dose on 11/21 10:11 AM. Now, Apixaban  therapy discontinued with a tentative plan for EGD and colonoscopy on Sunday. Pharmacy consulted to change to IV heparin  therapy this AM - no bolus with plans to stop therapy ~6 hrs prior to procedure on Sunday.   Hgb down at 7.8. Platelets are stable.  SCr stable 0.7-0.8.  Making urine - color amber. No further hematuria noted.   Goal of Therapy:  Heparin  level 0.3-0.7 units/ml aPTT 66-102 seconds Monitor platelets by anticoagulation protocol: Yes   Plan:  No bolus. At 2200 PM, Start heparin  infusion at 1000 units/hr Check anti-Xa level in 6-8 hours and daily while on heparin  Continue to monitor H&H and platelets Follow-up timing of procedure  on Sunday 11/23 - hold heparin  ~6hrs prior to procedure.   Harlene Boga, PharmD, BCPS, BCCCP Please refer to [facility] for [facility] Pharmacy numbers 11/22/2023,12:17 PM

## 2023-11-22 NOTE — Consult Note (Signed)
 WOC Nurse Consult Note:  WOC consult performed remotely utilizing imaging and chart review Reason for Consult: left Foot wound  Wound type: L lateral foot full thickness wound in setting of DM II, second wound noted distally Sacral stage 3 pressure injury: noted improvement from imaging on 11/05/23, prior chart/image review confirms stage 3 present on admission. Pressure Injury POA: Yes Measurement: see nursing flow sheets Wound bed: L foot proximal: 20% pink moist, 80% yellow/black slough L foot distal: 100% brown eschar Sacrum: 100 % red, dry Drainage (amount, consistency, odor) see nursing flow sheets Periwound: L foot proximal: noted erythema extending from 12 o'clock to 5 o'clock (provider notified) L foot distal: intact Sacrum: intact, evidence of prior healing Dressing procedure/placement/frequency:  L foot proximal/distal wound: cleanse with Vashe (lawson # [id number]) allow to air dry.  Apply 1/4 thick layer of leptospermum honey to wound bed, top with dry gauze and silicone foam, change daily. Ok to lift silicone foam to reapply Medihoney daily.    Sacrum: Cleanse with Vashe (lawson # [id number]) allow to air dry. Place Xeroform over wound bed and cover with silicone foam dressing.  Change daily.       WOC team will not follow at this time.  Please re consult if new needs arise.  Thank you,  Doyal Polite, MSN, RN, [facility] WOC Team [phone] (Available Mon-Fri 0700-1500)

## 2023-11-22 NOTE — Discharge Instructions (Signed)

## 2023-11-23 DIAGNOSIS — Z7901 Long term (current) use of anticoagulants: Secondary | ICD-10-CM | POA: Diagnosis not present

## 2023-11-23 DIAGNOSIS — D509 Iron deficiency anemia, unspecified: Secondary | ICD-10-CM | POA: Diagnosis not present

## 2023-11-23 DIAGNOSIS — R310 Gross hematuria: Secondary | ICD-10-CM | POA: Diagnosis not present

## 2023-11-23 DIAGNOSIS — J869 Pyothorax without fistula: Secondary | ICD-10-CM | POA: Diagnosis not present

## 2023-11-23 LAB — COMPREHENSIVE METABOLIC PANEL WITH GFR
ALT: 12 U/L (ref 0–44)
AST: 17 U/L (ref 15–41)
Albumin: 2.2 g/dL — ABNORMAL LOW (ref 3.5–5.0)
Alkaline Phosphatase: 133 U/L — ABNORMAL HIGH (ref 38–126)
Anion gap: 10 (ref 5–15)
BUN: 8 mg/dL (ref 8–23)
CO2: 21 mmol/L — ABNORMAL LOW (ref 22–32)
Calcium: 7.9 mg/dL — ABNORMAL LOW (ref 8.9–10.3)
Chloride: 101 mmol/L (ref 98–111)
Creatinine, Ser: 0.73 mg/dL (ref 0.61–1.24)
GFR, Estimated: >60 mL/min (ref >60)
Glucose, Bld: 171 mg/dL — ABNORMAL HIGH (ref 70–99)
Potassium: 4.6 mmol/L (ref 3.5–5.1)
Sodium: 132 mmol/L — ABNORMAL LOW (ref 135–145)
Total Bilirubin: 0.4 mg/dL (ref 0.0–1.2)
Total Protein: 6.3 g/dL — ABNORMAL LOW (ref 6.5–8.1)

## 2023-11-23 LAB — CBC WITH DIFFERENTIAL/PLATELET
Abs Immature Granulocytes: 0.02 K/uL (ref 0.00–0.07)
Basophils Absolute: 0.1 K/uL (ref 0.0–0.1)
Basophils Relative: 1 %
Eosinophils Absolute: 0.2 K/uL (ref 0.0–0.5)
Eosinophils Relative: 3 %
HCT: 27.3 % — ABNORMAL LOW (ref 39.0–52.0)
Hemoglobin: 8.6 g/dL — ABNORMAL LOW (ref 13.0–17.0)
Immature Granulocytes: 0 %
Lymphocytes Relative: 13 %
Lymphs Abs: 0.8 K/uL (ref 0.7–4.0)
MCH: 27.6 pg (ref 26.0–34.0)
MCHC: 31.5 g/dL (ref 30.0–36.0)
MCV: 87.5 fL (ref 80.0–100.0)
Monocytes Absolute: 0.5 K/uL (ref 0.1–1.0)
Monocytes Relative: 8 %
Neutro Abs: 4.7 K/uL (ref 1.7–7.7)
Neutrophils Relative %: 75 %
Platelets: 220 K/uL (ref 150–400)
RBC: 3.12 MIL/uL — ABNORMAL LOW (ref 4.22–5.81)
RDW: 17.3 % — ABNORMAL HIGH (ref 11.5–15.5)
WBC: 6.2 K/uL (ref 4.0–10.5)
nRBC: 0 % (ref 0.0–0.2)

## 2023-11-23 LAB — PHOSPHORUS: Phosphorus: 3.1 mg/dL (ref 2.5–4.6)

## 2023-11-23 LAB — MAGNESIUM: Magnesium: 1.9 mg/dL (ref 1.7–2.4)

## 2023-11-23 LAB — HEPARIN LEVEL (UNFRACTIONATED): Heparin Unfractionated: >1.1 [IU]/mL — ABNORMAL HIGH (ref 0.30–0.70)

## 2023-11-23 LAB — GLUCOSE, CAPILLARY
Glucose-Capillary: 123 mg/dL — ABNORMAL HIGH (ref 70–99)
Glucose-Capillary: 155 mg/dL — ABNORMAL HIGH (ref 70–99)
Glucose-Capillary: 125 mg/dL — ABNORMAL HIGH (ref 70–99)
Glucose-Capillary: 135 mg/dL — ABNORMAL HIGH (ref 70–99)

## 2023-11-23 LAB — APTT
aPTT: 66 s — ABNORMAL HIGH (ref 24–36)
aPTT: 78 s — ABNORMAL HIGH (ref 24–36)

## 2023-11-23 MED ORDER — NA SULFATE-K SULFATE-MG SULF 17.5-3.13-1.6 GM/177ML PO SOLN
0.5000 | Freq: Once | ORAL | Status: AC
  Administered 2023-11-23: 177 mL via ORAL
  Filled 2023-11-23: qty 1

## 2023-11-23 MED ORDER — SIMETHICONE 80 MG PO CHEW
240.0000 mg | CHEWABLE_TABLET | Freq: Once | ORAL | Status: AC
  Administered 2023-11-23: 240 mg via ORAL
  Filled 2023-11-23: qty 3

## 2023-11-23 MED ORDER — BISACODYL 5 MG PO TBEC
10.0000 mg | DELAYED_RELEASE_TABLET | Freq: Once | ORAL | Status: AC
  Administered 2023-11-23: 10 mg via ORAL
  Filled 2023-11-23: qty 2

## 2023-11-23 MED ORDER — SODIUM CHLORIDE 0.9 % IV SOLN: INTRAVENOUS | Status: AC

## 2023-11-23 NOTE — Plan of Care (Signed)
   Problem: Health Behavior/Discharge Planning: Goal: Ability to manage health-related needs will improve Outcome: Progressing   Problem: Clinical Measurements: Goal: Ability to maintain clinical measurements within normal limits will improve Outcome: Progressing Goal: Will remain free from infection Outcome: Progressing

## 2023-11-23 NOTE — Progress Notes (Signed)
 PROGRESS NOTE    [name] [name]  FMW:[id number] DOB: February 09, 1955 DOA: 11/12/2023 PCP: [name] HERO, PA-C   Brief Narrative:  [name] [name] is a 68 y.o. male with a history of atrial fibrillation with slow ventricular response, CAD, CABG x 4, aortic aneurysm s/p repair, CVA, diabetes mellitus type 2, heart failure with preserved EF.  Patient presented secondary to abnormal metabolic panel and while in the emergency department found to have evidence of gross hematuria, anemia and AKI.  Urology consulted.  Patient started IV fluids.  During workup, patient was Hemoccult positive, GI consulted.  Hemoglobin continued to drift down requiring 1 unit PRBC via transfusion.  CT imaging on admission was significant for right pleural effusion concerning for possible empyema.  Pulmonology consulted for chest tube placement which was performed on 11/14 and this was subsequently removed 11/18/2023.    Pulmonary signed off the case. Ambulatory Home O2 Screen done and he did not desaturate. TOV attempted but he failed so Foley reinserted and will continue.  After discussion with urology and GI anticoagulation was resumed however now will be changing to heparin  drip.  Appeared improved and medically stable for SNF however his insurance authorization was denied despite peer to peer.  Now patient is agreeable for further workup as below psychiatry was consulted for capacity evaluation patient has the capacity to decline SNF.    Patient now agreeable to getting an EGD and colonoscopy so no longer medically stable for discharge and anticoagulation has been held and GI has been reconsulted. Plan is for EGD and Colonoscopy likely to be done 11/24/23  Assessment and Plan:  Gross Hematuria: Possibly related to foley catheter trauma and complicated by Eliquis  use. Urology consulted. CT abdomen/pelvis significant for normal kidneys without hydronephrosis, nonobstructing stone versus vascular calcification of  the upper pole left kidney, no renal mass.  Hematuria catheter was declined by patient.  Per urology, possible hemorrhagic cystitis. Hematuria catheter placed and CBI started on 11/14. Urine culture positive for 20,000 colonies/ml of staphylococcus hemolyticus. -Urology recommendations: Treat for possible UTI, continue Foley with continued attempts for replacement of catheter without hematuria catheter, trend H&H as below; urology team was recommending continuing catheter for this 1 to 2 days and then consider a trial of void as his GU bleeding subsided.  They also recommended continuing finasteride  at discharge to try and reduce the prostate source of bleeding.  Trial of void failed so had to be reinserted.  Restarted on his home anticoagulation but will be held again given IDA and Heme + Stools requiring EGD/Colon  Possible UTI: Urine culture obtained. Urinalysis with rare bacteruria, pyuria and is nitrite positive; significant squamous cells. Urine culture positive for 20,000 colonies/ml of staphylococcus hemolyticus. Completed Abx and stopped Bactrim    Acute Blood Loss Anemia / Chronic Iron  Deficiency Anemia: Baseline hemoglobin appears to be around 8-9.  Hemoglobin of 7.5 on admission with downtrend to 6.7 likely related to patient's gross hematuria in addition to heme positive stool.  1 unit of PRBC ordered on 11/13 with posttransfusion hemoglobin up to 8.5.  Hemoglobin/Hct now stable at current trend and last check was 8.6/27.3.  -CTM for S/Sx of Bleeding; has been resumed on apixaban  however will hold in place of heparin  drip given that he is agreeable to getting EGD and colon repeat CBC in the AM -GI reconsulted and planning on starting him on clear liquids on 11/23/2023 and tentative plan is for colonoscopy and EGD on Sunday with Dr. San with bowel prep starting  Saturday evening   AKI: Present on admission with creatinine of 1.48 and peak of 1.50. Resolved. BUN/Cr is now 8/0.73. Avoid  Nephrotoxic Medications, Contrast Dyes, Hypotension and Dehydration to Ensure Adequate Renal Perfusion and will need to Renally Adjust Meds. CTM & Trend Renal Function carefully & repeat CMP in the AM   Heme Positive stool: No history of hematochezia or melena.  BUN significantly elevated on admission, but patient also has evidence of dehydration.  Complicated by Eliquis  use. GI consulted and have recommended inpatient EGD/Colonoscopy. Patient initially continued to adamantly decline endoscopy procedures but now agreeable. Was placed back on [facility] w/ Eliquis  but will hold now and place on Heparin  gtt. Now on CLD and NPO and bowel prep initiated. Heparin  to be held on 11/24/23.   Paroxysmal atrial fibrillation with slow ventricular response: Patient is not on rate control medication secondary to slow ventricular response.  He is managed on Eliquis  secondary to a CHA2DS2-VASc of 4.  Eliquis  was held secondary to hematuria and associated acute anemia.  After further discussion with GI and urology they recommended resuming 11/21/2023 but now will be held again as patient is agreeable to EGD and Colonoscopy and to be done 11/23   Right Pleural Effusion: Noted on CT imaging with evidence of moderate-sized collection.  Concern for possible empyema secondary to gas seen on imaging.  Last thoracentesis was from February 2025, which was also right-sided and was secondary to heart failure.  Patient is completely asymptomatic.  Pulmonology consulted and placed a right-sided chest tube on 11/14 and have applied lytics for management of loculated effusion -Pulmonology consultation for recommendations:  -Follow-up pleural fluid culture, pathology; Overnight Provider received a call overnight on 11/17/2023 regarding her pleural fluid in the body fluid cell count with differential results was reading as a typical number of immature mononuclear cells.  This was sent to pathology and flow cytometry recommended but and available  in house -Stopped and completed Ceftriaxone /Flagyl  now.   -Repeat CXR 11/18: Interval removal of right basilar chest tube with unchanged loculated pneumothorax at the right costophrenic angle. Stable small bilateral pleural effusions and bibasilar atelectasis. -Patient had his chest tube removed and had no complications or complaints afterwards.  Pulmonary signed off the case on 11/19/2023   Diabetes Mellitus Type 2: Uncontrolled with hyperglycemia based on last hemoglobin A1c of 8.3%. Changing Very Sensitive Novolog  SSI TIDwm to Sensitive Novolog  SSI AC/HS. CTM CBGs per Protocol. CBGs ranging from 116-240 on the last 7 checks   CAD: S/p CABG x4.  Had Resumed Aspirin  81 mg p.o. daily but will hold again given that patient o undergo EGD and Colonoscopy  Nausea and Vomiting: Initiated antiemetics with IV ondansetron  and his nausea vomiting is improved.   Aortic atherosclerosis: Noted on CT imaging.   Aortic aneurysm: Stable.  Hypophosphatemia: Phos Level is now 3.1. CTM and Replete as necessary. Repeat Phos Level in the AM   Chronic HFpEF: Currently stable. Strict I's and O's and Daily Weights. CTM for S/Sx of Volume Overload.   PVD: Noted. Not on medication therapy but resume ASA 81 mg po Daily   Ambulatory dysfunction: Noted. PT/OT recommendations: SNF at discharge however patient has been mental capacity to decline SNF based on psychiatric evaluation.  Unfortunately also his insurance authorization has been denied and patient does not want to go to SNF. Now no longer stable to D/C given that he is undergoing EGD/Colon and obtaining further evaluation for left foot wound   Left foot wound: Consulted WOC  Nurse and wound seemed to have worsened so we will obtain imaging. -Wound care is recommending cleansing the wound with Vashe and allowed to air dry and applying a 1/4 inch thick layer of Medihoney to the wound bed and top with dry gauze and silicone foam and changing daily. -CT Scan showed  cutaneous thickening and irregularity corresponded to wound along the lateral and plantar forefoot, overlying the level of the distal fifth metatarsal. No appreciable osteolysis or erosive changes of the underlying fifth metatarsal to suggest acute osteomyelitis, No loculated fluid collection, and Diffusely heterogenous appearance of the bones with decreased osseous mineralization. -Bactrim  is now complete -CTM wound    Generalized Anxiety Disorder and Depression: C/w Paroxetine  20 mg po daily, Mirtazepine 15 mg po at bedtime; Also on Quetiapine  25 mg po at bedtime  Cognitive Dysfunction: Noted. Continue Quetiapine  25 mg po Daily   Hypoalbuminemia: Patient's Albumin ranging from 1.8-2.2. CTM & Trend & repeat CMP in the AM   DVT prophylaxis: Place and maintain sequential compression device Start: 11/20/23 1505 SCDs Start: 11/13/23 0733    Code Status: Full Code Family Communication: No family present @ bedside   Disposition Plan:  Level of care: Telemetry Status is: Inpatient Remains inpatient appropriate because: Needs further clinical improvement and clearance by the specialists    Consultants:  Pulmonary  Urology Psychiatry Gastroenterology  Procedures:  As delineated as above  Antimicrobials:  Anti-infectives (From admission, onward)    Start     Dose/Rate Route Frequency Ordered Stop   11/16/23 1315  sulfamethoxazole -trimethoprim  (BACTRIM  DS) 800-160 MG per tablet 1 tablet        1 tablet Oral Every 12 hours 11/16/23 1202 11/22/23 2203   11/14/23 1630  metroNIDAZOLE  (FLAGYL ) IVPB 500 mg  Status:  Discontinued        500 mg 100 mL/hr over 60 Minutes Intravenous Every 8 hours 11/14/23 1534 11/20/23 1606   11/14/23 0200  cefTRIAXone  (ROCEPHIN ) 1 g in sodium chloride  0.9 % 100 mL IVPB  Status:  Discontinued        1 g 200 mL/hr over 30 Minutes Intravenous Every 24 hours 11/13/23 0821 11/20/23 1606   11/13/23 0215  cefTRIAXone  (ROCEPHIN ) 1 g in sodium chloride  0.9 % 100 mL  IVPB        1 g 200 mL/hr over 30 Minutes Intravenous  Once 11/13/23 0208 11/13/23 0411       Subjective: Seen and examined at bedside and resting and had no complaints or pain.  Still okay with proceeding with the procedure so we will be initiating a clear liquid diet and then proceeding with bowel prep tonight. Denied any lightheadedness or dizziness.  No other concerns or complaints at this time  Objective: Vitals:   11/22/23 1950 11/23/23 0414 11/23/23 0825 11/23/23 1520  BP: (!) 172/75 (!) 146/81 (!) 155/68 (!) 153/80  Pulse: (!) 49 (!) 44 (!) 56 (!) 58  Resp: 17 18 18 16   Temp: 98.6 F (37 C) 98.5 F (36.9 C) 98.4 F (36.9 C) 97.8 F (36.6 C)  TempSrc:  Oral  Oral  SpO2: 99% 100% 97% 99%  Weight:      Height:        Intake/Output Summary (Last 24 hours) at 11/23/2023 1704 Last data filed at 11/23/2023 1500 Gross per 24 hour  Intake --  Output 2000 ml  Net -2000 ml   Filed Weights   11/12/23 2154  Weight: 68 kg   Examination: Physical Exam:  Constitutional: Chronically ill-appearing  Caucasian male in no acute distress who is resting Respiratory: Slightly diminished to auscultation bilaterally with some coarse breath sound, no wheezing, rales, rhonchi or crackles. Normal respiratory effort and patient is not tachypenic. No accessory muscle use.  Unlabored breathing Cardiovascular: RRR, no murmurs / rubs / gallops. S1 and S2 auscultated. No extremity edema Abdomen: Soft, non-tender, non-distended.  Bowel sounds positive.  GU: Deferred. Musculoskeletal: No clubbing / cyanosis of digits/nails. Left foot wrapped Skin: No rashes, lesions, ulcers on a limited skin evaluation and did not turn to view sacrum. Left foot is wrapped. No induration; Warm and dry.  Neurologic: CN 2-12 grossly intact with no focal deficits. Romberg sign and cerebellar reflexes not assessed.  Psychiatric: Awake and appears calm  Data Reviewed: I have personally reviewed following labs and  imaging studies  CBC: Recent Labs  Lab 11/19/23 0514 11/20/23 0523 11/21/23 0515 11/22/23 0412 11/23/23 1431  WBC 6.1 5.2 5.2 5.2 6.2  NEUTROABS 4.4 3.5 3.5 3.6 4.7  HGB 8.2* 8.7* 8.1* 7.8* 8.6*  HCT 25.9* 27.1* 26.1* 24.7* 27.3*  MCV 86.9 88.0 88.8 87.9 87.5  PLT 230 213 204 196 220   Basic Metabolic Panel: Recent Labs  Lab 11/19/23 0514 11/20/23 0523 11/21/23 0515 11/22/23 0412 11/23/23 1431  NA 135 138 134* 135 132*  K 4.3 4.4 4.3 4.4 4.6  CL 102 105 101 100 101  CO2 23 22 25 24  21*  GLUCOSE 132* 121* 138* 246* 171*  BUN 11 11 10 12 8   CREATININE 0.80 0.85 0.82 0.70 0.73  CALCIUM  7.8* 7.8* 7.8* 7.7* 7.9*  MG 1.9 1.9 1.8 2.2 1.9  PHOS 2.6 2.9 2.8 3.2 3.1   GFR: Estimated Creatinine Clearance: 82.6 mL/min (by C-G formula based on SCr of 0.73 mg/dL). Liver Function Tests: Recent Labs  Lab 11/19/23 0514 11/20/23 0523 11/21/23 0515 11/22/23 0412 11/23/23 1431  AST 18 19 15 17 17   ALT 14 13 12 12 12   ALKPHOS 127* 131* 129* 132* 133*  BILITOT 0.9 0.6 0.6 0.3 0.4  PROT 5.8* 5.8* 5.5* 5.6* 6.3*  ALBUMIN 2.0* 2.0* 1.9* 1.9* 2.2*   No results for input(s): LIPASE, AMYLASE in the last 168 hours. No results for input(s): AMMONIA in the last 168 hours. Coagulation Profile: No results for input(s): INR, PROTIME in the last 168 hours. Cardiac Enzymes: No results for input(s): CKTOTAL, CKMB, CKMBINDEX, TROPONINI in the last 168 hours. BNP (last 3 results) Recent Labs    03/01/23 1157 08/18/23 1610  PROBNP 399.0* 3,796.0*   HbA1C: No results for input(s): HGBA1C in the last 72 hours. CBG: Recent Labs  Lab 11/22/23 1636 11/22/23 2009 11/23/23 0628 11/23/23 1130 11/23/23 1653  GLUCAP 187* 116* 123* 155* 125*   Lipid Profile: No results for input(s): CHOL, HDL, LDLCALC, TRIG, CHOLHDL, LDLDIRECT in the last 72 hours. Thyroid  Function Tests: No results for input(s): TSH, T4TOTAL, FREET4, T3FREE, THYROIDAB in the  last 72 hours. Anemia Panel: No results for input(s): VITAMINB12, FOLATE, FERRITIN, TIBC, IRON , RETICCTPCT in the last 72 hours. Sepsis Labs: No results for input(s): PROCALCITON, LATICACIDVEN in the last 168 hours.  Recent Results (from the past 240 hours)  Body fluid culture w Gram Stain     Status: None   Collection Time: 11/15/23  5:29 PM   Specimen: Pleural Fluid  Result Value Ref Range Status   Specimen Description PLEURAL  Final   Special Requests RIGHT  Final   Gram Stain NO WBC SEEN NO ORGANISMS SEEN   Final  Culture   Final    NO GROWTH 3 DAYS Performed at [facility], [address], [place], [place] [address]    Report Status 11/18/2023 FINAL  Final  Fungus Culture With Stain     Status: None (Preliminary result)   Collection Time: 11/15/23  5:30 PM   Specimen: Pleural, Right; Pleural Fluid  Result Value Ref Range Status   Fungus Stain Final report  Final    Comment: (NOTE) Performed At: [facility] [address] [place], [place] [id number] Jennette Shorter MD Ey:[id number]    Fungus (Mycology) Culture PENDING  Incomplete   Fungal Source RIGHT PLEURAL  Final    Comment: Performed at [facility], [address] [place], [place] [address]  Fungus Culture Result     Status: None   Collection Time: 11/15/23  5:30 PM  Result Value Ref Range Status   Result 1 Comment  Final    Comment: (NOTE) KOH/Calcofluor preparation:  no fungus observed. Performed At: [facility] [address] [place], [place] [id number] Jennette Shorter MD Ey:[id number]     Radiology Studies: CT FOOT LEFT W CONTRAST Result Date: 11/22/2023 CLINICAL DATA:  Left foot wound.  Concern for osteomyelitis. EXAM: CT OF THE LOWER LEFT EXTREMITY WITH CONTRAST TECHNIQUE: Multidetector CT imaging of the lower left extremity was performed according to the standard protocol following intravenous contrast administration. RADIATION DOSE REDUCTION: This exam was  performed according to the departmental dose-optimization program which includes automated exposure control, adjustment of the mA and/or kV according to patient size and/or use of iterative reconstruction technique. CONTRAST:  75mL OMNIPAQUE  IOHEXOL  350 MG/ML SOLN COMPARISON:  None Available. FINDINGS: Bones/Joint/Cartilage Cutaneous thickening and irregularity, likely corresponded to wound, along the lateral and plantar forefoot, overlying the level of the distal fifth metatarsal. No appreciable acute osteolysis or erosive changes of the underlying fifth metatarsal to suggest acute osteomyelitis. There is diffusely heterogenous appearance of the bones with decreased osseous mineralization and patchy and confluent lucency. No acute fracture or dislocation. Mild degenerative changes of the midfoot and first MTP joint. Dorsal spurring at the talar neck. Calcaneal enthesopathy at the insertion of the Achilles tendon. Ligaments Ligaments are suboptimally evaluated by CT. Muscles and Tendons Mild atrophy of the intrinsic foot musculature. Soft tissue No loculated fluid collection.  No soft tissue gas. IMPRESSION: 1. Cutaneous thickening and irregularity corresponded to wound along the lateral and plantar forefoot, overlying the level of the distal fifth metatarsal. No appreciable osteolysis or erosive changes of the underlying fifth metatarsal to suggest acute osteomyelitis. 2. No loculated fluid collection. 3. Diffusely heterogenous appearance of the bones with decreased osseous mineralization. Electronically Signed   By: Harrietta Sherry M.D.   On: 11/22/2023 15:01   Scheduled Meds:  Chlorhexidine  Gluconate Cloth  6 each Topical Daily   collagenase   1 Application Topical Daily   finasteride   5 mg Oral Daily   insulin  aspart  0-5 Units Subcutaneous QHS   insulin  aspart  0-9 Units Subcutaneous TID WC   mirtazapine   15 mg Oral QHS   Na Sulfate-K Sulfate-Mg Sulfate concentrate  0.5 kit Oral Once   Followed by    Na Sulfate-K Sulfate-Mg Sulfate concentrate  0.5 kit Oral Once   PARoxetine   20 mg Oral Daily   QUEtiapine   25 mg Oral QHS   simethicone   240 mg Oral Once   Followed by   simethicone   240 mg Oral Once   sodium chloride  flush  10 mL Intrapleural Q8H  Continuous Infusions:  sodium chloride      heparin  1,000 Units/hr (11/22/23 2211)   sodium chloride  irrigation      LOS: 10 days   Alejandro Marker, DO [facility] Available via Epic secure chat 7am-7pm After these hours, please refer to coverage provider listed on [email or web address] 11/23/2023, 5:04 PM

## 2023-11-23 NOTE — Progress Notes (Signed)
 PHARMACY - ANTICOAGULATION CONSULT NOTE  Pharmacy Consult for IV Heparin  Indication: atrial fibrillation  Allergies  Allergen Reactions   Other Hives and Itching    Wool fabric     Patient Measurements: Height: 6' 3 (190.5 cm) Weight: 68 kg (150 lb) IBW/kg (Calculated) : 84.5 HEPARIN  DW (KG): 68  Vital Signs: Temp: 98.5 F (36.9 C) (11/22 0414) Temp Source: Oral (11/22 0414) BP: 146/81 (11/22 0414) Pulse Rate: 44 (11/22 0414)  Labs: Recent Labs    11/21/23 0515 11/22/23 0412 11/23/23 0623  HGB 8.1* 7.8*  --   HCT 26.1* 24.7*  --   PLT 204 196  --   APTT  --   --  66*  HEPARINUNFRC  --   --  >1.10*  CREATININE 0.82 0.70  --     Estimated Creatinine Clearance: 82.6 mL/min (by C-G formula based on SCr of 0.7 mg/dL).   Medical History: Past Medical History:  Diagnosis Date   Acute urinary retention 01/04/2022   Altered mental status 08/18/2023   Anxiety    Arthritis    Cataract    CHF (congestive heart failure) (HCC)    Constipation    pt states goes EOD- hard stools    Coronary artery disease    Depression    Diabetes mellitus without complication (HCC)    Diabetic retinopathy (HCC)    DKA (diabetic ketoacidoses)    Hyperlipidemia    Hypertension    off meds   Hypertensive retinopathy    Lesion of ulnar nerve, left upper limb 07/12/2017   Non-intractable vomiting without nausea 06/10/2018   Postherpetic neuralgia 08/11/2021   Speculative diagnosis   Psoriasis    Tubular adenoma of colon 03/2019   UTI (urinary tract infection) 01/05/2022    Medications:  Scheduled:   Chlorhexidine  Gluconate Cloth  6 each Topical Daily   collagenase   1 Application Topical Daily   finasteride   5 mg Oral Daily   insulin  aspart  0-5 Units Subcutaneous QHS   insulin  aspart  0-9 Units Subcutaneous TID WC   mirtazapine   15 mg Oral QHS   PARoxetine   20 mg Oral Daily   polyethylene glycol  17 g Oral BID   QUEtiapine   25 mg Oral QHS   senna-docusate  1 tablet Oral  BID   sodium chloride  flush  10 mL Intrapleural Q8H   Infusions:   heparin  1,000 Units/hr (11/22/23 2211)   sodium chloride  irrigation      Assessment: 68 years of age male with a history of atrial fibrillation (on Apixaban  pta) with slow ventricular response, CAD, CABG x4, aortic aneurysm s/p repair, CVA, DM, and HFpEF who presented with AKI, gross hematuria, and right pleural effusion. Chest tube was placed 11/14 and removed 11/17. GI and urology were consulted. Patient initially refused endoscopy and colonoscopy. CHADS2-VASc score = 4. GI and Urology are ok with resuming anticoagulation. Apixaban  was resumed 11/20 at 5 BID - last dose on 11/21 10:11 AM. Now, Apixaban  therapy discontinued with a tentative plan for EGD and colonoscopy on Sunday. Pharmacy consulted to change to IV heparin  therapy this AM - no bolus with plans to stop therapy ~6 hrs prior to procedure on Sunday.   Heparin  level elevated as expected due to recent apixaban  use.  aPTT is 66 secs which is at the low end of the goal range. Hgb 7.8. Platelets are stable.  SCr stable 0.7-0.8.  No further hematuria noted.   Goal of Therapy:  Heparin  level 0.3-0.7 units/ml aPTT 66-102  seconds Monitor platelets by anticoagulation protocol: Yes   Plan:  No bolus. Continue heparin  infusion at 1000 units/hr Check aPTT in 6-8 hours to confirm and aPTT and heparin  level daily while on heparin  Continue to monitor H&H and platelets Follow-up timing of procedure on Sunday 11/23 - hold heparin  ~6hrs prior to procedure.   Donny Alert, PharmD, [facility] Clinical Pharmacist Please see AMION for all Pharmacists' Contact Phone Numbers 11/23/2023, 7:13 AM

## 2023-11-23 NOTE — Progress Notes (Signed)
 PHARMACY - ANTICOAGULATION CONSULT NOTE  Pharmacy Consult for IV Heparin  Indication: atrial fibrillation  Allergies  Allergen Reactions   Other Hives and Itching    Wool fabric     Patient Measurements: Height: 6' 3 (190.5 cm) Weight: 68 kg (150 lb) IBW/kg (Calculated) : 84.5 HEPARIN  DW (KG): 68  Vital Signs: Temp: 98.4 F (36.9 C) (11/22 0825) Temp Source: Oral (11/22 0414) BP: 155/68 (11/22 0825) Pulse Rate: 56 (11/22 0825)  Labs: Recent Labs    11/21/23 0515 11/22/23 0412 11/23/23 0623 11/23/23 1431  HGB 8.1* 7.8*  --  8.6*  HCT 26.1* 24.7*  --  27.3*  PLT 204 196  --  220  APTT  --   --  66* 78*  HEPARINUNFRC  --   --  >1.10*  --   CREATININE 0.82 0.70  --   --     Estimated Creatinine Clearance: 82.6 mL/min (by C-G formula based on SCr of 0.7 mg/dL).   Medical History: Past Medical History:  Diagnosis Date   Acute urinary retention 01/04/2022   Altered mental status 08/18/2023   Anxiety    Arthritis    Cataract    CHF (congestive heart failure) (HCC)    Constipation    pt states goes EOD- hard stools    Coronary artery disease    Depression    Diabetes mellitus without complication (HCC)    Diabetic retinopathy (HCC)    DKA (diabetic ketoacidoses)    Hyperlipidemia    Hypertension    off meds   Hypertensive retinopathy    Lesion of ulnar nerve, left upper limb 07/12/2017   Non-intractable vomiting without nausea 06/10/2018   Postherpetic neuralgia 08/11/2021   Speculative diagnosis   Psoriasis    Tubular adenoma of colon 03/2019   UTI (urinary tract infection) 01/05/2022    Medications:  Scheduled:   Chlorhexidine  Gluconate Cloth  6 each Topical Daily   collagenase   1 Application Topical Daily   finasteride   5 mg Oral Daily   insulin  aspart  0-5 Units Subcutaneous QHS   insulin  aspart  0-9 Units Subcutaneous TID WC   mirtazapine   15 mg Oral QHS   Na Sulfate-K Sulfate-Mg Sulfate concentrate  0.5 kit Oral Once   Followed by   Na  Sulfate-K Sulfate-Mg Sulfate concentrate  0.5 kit Oral Once   PARoxetine   20 mg Oral Daily   QUEtiapine   25 mg Oral QHS   simethicone   240 mg Oral Once   Followed by   simethicone   240 mg Oral Once   sodium chloride  flush  10 mL Intrapleural Q8H   Infusions:   sodium chloride      heparin  1,000 Units/hr (11/22/23 2211)   sodium chloride  irrigation      Assessment: 68 years of age male with a history of atrial fibrillation (on Apixaban  pta) with slow ventricular response, CAD, CABG x4, aortic aneurysm s/p repair, CVA, DM, and HFpEF who presented with AKI, gross hematuria, and right pleural effusion. Chest tube was placed 11/14 and removed 11/17. GI and urology were consulted. Patient initially refused endoscopy and colonoscopy. CHADS2-VASc score = 4. GI and Urology are ok with resuming anticoagulation. Apixaban  was resumed 11/20 at 5 BID - last dose on 11/21 10:11 AM. Now, Apixaban  therapy discontinued with a tentative plan for EGD and colonoscopy on Sunday. Pharmacy consulted to change to IV heparin  therapy this AM - no bolus with plans to stop therapy ~6 hrs prior to procedure on Sunday.   Heparin  level  elevated as expected due to recent apixaban  use.  aPTT is 66 secs which is at the low end of the goal range. Hgb 7.8. Platelets are stable.  SCr stable 0.7-0.8.  No further hematuria noted.   PM: aPTT 78 sec is therapeutic on 1000 units/hr. No issues with the infusion or bleeding reported per RN.  Goal of Therapy:  Heparin  level 0.3-0.7 units/ml aPTT 66-102 seconds Monitor platelets by anticoagulation protocol: Yes   Plan:  No bolus. Continue heparin  infusion at 1000 units/hr Stop heparin  at 0300 on 11/23 for colonoscopy   Rocky Slade, PharmD, BCPS Clinical Pharmacist Please see AMION for all Pharmacists' Contact Phone Numbers 11/23/2023, 3:06 PM

## 2023-11-23 NOTE — Progress Notes (Signed)
 Patient ID: [name] [name], male   DOB: 08-Dec-1955, 68 y.o.   MRN: [id number]  Brief GI note  Patient scheduled for colonoscopy and EGD tomorrow with Dr. San Clear liquids today, n.p.o. past midnight Bowel prep scheduled for this evening Eliquis  stopped Will hold heparin  starting at 3 AM tomorrow 11/24/2023 in anticipation of procedures.  Today's labs pending

## 2023-11-23 NOTE — Plan of Care (Signed)

## 2023-11-24 ENCOUNTER — Encounter (HOSPITAL_COMMUNITY): Payer: Self-pay | Admitting: Family Medicine

## 2023-11-24 ENCOUNTER — Encounter (HOSPITAL_COMMUNITY)
Admission: EM | Disposition: A | Discharge status: Skilled Nursing Facility | Payer: Self-pay | Source: Skilled Nursing Facility | Attending: Family Medicine

## 2023-11-24 ENCOUNTER — Inpatient Hospital Stay (HOSPITAL_COMMUNITY): Admitting: Certified Registered Nurse Anesthetist

## 2023-11-24 DIAGNOSIS — K2950 Unspecified chronic gastritis without bleeding: Secondary | ICD-10-CM

## 2023-11-24 DIAGNOSIS — I2510 Atherosclerotic heart disease of native coronary artery without angina pectoris: Secondary | ICD-10-CM

## 2023-11-24 DIAGNOSIS — I110 Hypertensive heart disease with heart failure: Secondary | ICD-10-CM | POA: Diagnosis not present

## 2023-11-24 DIAGNOSIS — J869 Pyothorax without fistula: Secondary | ICD-10-CM | POA: Diagnosis not present

## 2023-11-24 DIAGNOSIS — I509 Heart failure, unspecified: Secondary | ICD-10-CM

## 2023-11-24 DIAGNOSIS — K31A19 Gastric intestinal metaplasia without dysplasia, unspecified site: Secondary | ICD-10-CM

## 2023-11-24 DIAGNOSIS — K269 Duodenal ulcer, unspecified as acute or chronic, without hemorrhage or perforation: Secondary | ICD-10-CM

## 2023-11-24 DIAGNOSIS — Z538 Procedure and treatment not carried out for other reasons: Secondary | ICD-10-CM

## 2023-11-24 DIAGNOSIS — R195 Other fecal abnormalities: Secondary | ICD-10-CM | POA: Diagnosis not present

## 2023-11-24 DIAGNOSIS — J90 Pleural effusion, not elsewhere classified: Secondary | ICD-10-CM | POA: Diagnosis not present

## 2023-11-24 DIAGNOSIS — R310 Gross hematuria: Secondary | ICD-10-CM | POA: Diagnosis not present

## 2023-11-24 DIAGNOSIS — D509 Iron deficiency anemia, unspecified: Secondary | ICD-10-CM

## 2023-11-24 DIAGNOSIS — F17210 Nicotine dependence, cigarettes, uncomplicated: Secondary | ICD-10-CM

## 2023-11-24 HISTORY — PX: ESOPHAGOGASTRODUODENOSCOPY: SHX5428

## 2023-11-24 HISTORY — PX: COLONOSCOPY: SHX5424

## 2023-11-24 LAB — COMPREHENSIVE METABOLIC PANEL WITH GFR
ALT: 7 U/L (ref 0–44)
AST: 17 U/L (ref 15–41)
Albumin: 2.2 g/dL — ABNORMAL LOW (ref 3.5–5.0)
Alkaline Phosphatase: 132 U/L — ABNORMAL HIGH (ref 38–126)
Anion gap: 9 (ref 5–15)
BUN: 8 mg/dL (ref 8–23)
CO2: 24 mmol/L (ref 22–32)
Calcium: 8.3 mg/dL — ABNORMAL LOW (ref 8.9–10.3)
Chloride: 102 mmol/L (ref 98–111)
Creatinine, Ser: 0.65 mg/dL (ref 0.61–1.24)
GFR, Estimated: >60 mL/min (ref >60)
Glucose, Bld: 125 mg/dL — ABNORMAL HIGH (ref 70–99)
Potassium: 5.1 mmol/L (ref 3.5–5.1)
Sodium: 135 mmol/L (ref 135–145)
Total Bilirubin: 1 mg/dL (ref 0.0–1.2)
Total Protein: 6.2 g/dL — ABNORMAL LOW (ref 6.5–8.1)

## 2023-11-24 LAB — APTT: aPTT: 64 s — ABNORMAL HIGH (ref 24–36)

## 2023-11-24 LAB — CBC WITH DIFFERENTIAL/PLATELET
Abs Immature Granulocytes: 0.02 K/uL (ref 0.00–0.07)
Basophils Absolute: 0.1 K/uL (ref 0.0–0.1)
Basophils Relative: 1 %
Eosinophils Absolute: 0.1 K/uL (ref 0.0–0.5)
Eosinophils Relative: 2 %
HCT: 27.3 % — ABNORMAL LOW (ref 39.0–52.0)
Hemoglobin: 8.8 g/dL — ABNORMAL LOW (ref 13.0–17.0)
Immature Granulocytes: 0 %
Lymphocytes Relative: 16 %
Lymphs Abs: 0.8 K/uL (ref 0.7–4.0)
MCH: 28.2 pg (ref 26.0–34.0)
MCHC: 32.2 g/dL (ref 30.0–36.0)
MCV: 87.5 fL (ref 80.0–100.0)
Monocytes Absolute: 0.5 K/uL (ref 0.1–1.0)
Monocytes Relative: 10 %
Neutro Abs: 3.8 K/uL (ref 1.7–7.7)
Neutrophils Relative %: 71 %
Platelets: 217 K/uL (ref 150–400)
RBC: 3.12 MIL/uL — ABNORMAL LOW (ref 4.22–5.81)
RDW: 17.1 % — ABNORMAL HIGH (ref 11.5–15.5)
Smear Review: NORMAL
WBC: 5.3 K/uL (ref 4.0–10.5)
nRBC: 0 % (ref 0.0–0.2)

## 2023-11-24 LAB — GLUCOSE, CAPILLARY
Glucose-Capillary: 129 mg/dL — ABNORMAL HIGH (ref 70–99)
Glucose-Capillary: 91 mg/dL (ref 70–99)
Glucose-Capillary: 103 mg/dL — ABNORMAL HIGH (ref 70–99)
Glucose-Capillary: 126 mg/dL — ABNORMAL HIGH (ref 70–99)
Glucose-Capillary: 125 mg/dL — ABNORMAL HIGH (ref 70–99)

## 2023-11-24 LAB — PHOSPHORUS: Phosphorus: 3.5 mg/dL (ref 2.5–4.6)

## 2023-11-24 LAB — MAGNESIUM: Magnesium: 1.9 mg/dL (ref 1.7–2.4)

## 2023-11-24 MED ORDER — ONDANSETRON HCL 4 MG/2ML IJ SOLN
INTRAMUSCULAR | Status: DC | PRN
  Administered 2023-11-24: 4 mg via INTRAVENOUS

## 2023-11-24 MED ORDER — EPHEDRINE SULFATE-NACL 50-0.9 MG/10ML-% IV SOSY
PREFILLED_SYRINGE | INTRAVENOUS | Status: DC | PRN
  Administered 2023-11-24: 5 mg via INTRAVENOUS

## 2023-11-24 MED ORDER — HEPARIN (PORCINE) 25000 UT/250ML-% IV SOLN
1100.0000 [IU]/h | INTRAVENOUS | Status: AC
  Administered 2023-11-24: 1000 [IU]/h via INTRAVENOUS
  Filled 2023-11-24 (×2): qty 250

## 2023-11-24 MED ORDER — POLYETHYLENE GLYCOL 3350 17 GM/SCOOP PO POWD: 238.0000 g | Freq: Once | ORAL | Status: DC

## 2023-11-24 MED ORDER — POLYETHYLENE GLYCOL 3350 17 GM/SCOOP PO POWD
119.0000 g | Freq: Once | ORAL | Status: DC
  Filled 2023-11-24: qty 119

## 2023-11-24 MED ORDER — POLYETHYLENE GLYCOL 3350 17 GM/SCOOP PO POWD
119.0000 g | Freq: Once | ORAL | Status: AC
  Administered 2023-11-24: 119 g via ORAL
  Filled 2023-11-24: qty 119

## 2023-11-24 MED ORDER — SODIUM CHLORIDE 0.9 % IV SOLN: INTRAVENOUS | Status: DC

## 2023-11-24 MED ORDER — LIDOCAINE 2% (20 MG/ML) 5 ML SYRINGE
INTRAMUSCULAR | Status: DC | PRN
  Administered 2023-11-24: 60 mg via INTRAVENOUS

## 2023-11-24 MED ORDER — PROPOFOL 500 MG/50ML IV EMUL
INTRAVENOUS | Status: DC | PRN
  Administered 2023-11-24: 100 ug/kg/min via INTRAVENOUS

## 2023-11-24 MED ORDER — PANTOPRAZOLE SODIUM 40 MG PO TBEC: 40.0000 mg | DELAYED_RELEASE_TABLET | Freq: Two times a day (BID) | ORAL | Status: DC

## 2023-11-24 MED ORDER — POLYETHYLENE GLYCOL 3350 17 GM/SCOOP PO POWD: 119.0000 g | Freq: Once | ORAL | Status: DC

## 2023-11-24 NOTE — Progress Notes (Signed)
 PHARMACY - ANTICOAGULATION CONSULT NOTE  Pharmacy Consult for IV Heparin  Indication: atrial fibrillation  Allergies  Allergen Reactions   Other Hives and Itching    Wool fabric     Patient Measurements: Height: 6' 3 (190.5 cm) Weight: 68 kg (150 lb) IBW/kg (Calculated) : 84.5 HEPARIN  DW (KG): 68  Vital Signs: Temp: 97.6 F (36.4 C) (11/23 1124) Temp Source: Temporal (11/23 0943) BP: 109/58 (11/23 1139) Pulse Rate: 62 (11/23 1139)  Labs: Recent Labs    11/22/23 0412 11/23/23 0623 11/23/23 1431 11/24/23 0549  HGB 7.8*  --  8.6* 8.8*  HCT 24.7*  --  27.3* 27.3*  PLT 196  --  220 217  APTT  --  66* 78*  --   HEPARINUNFRC  --  >1.10*  --   --   CREATININE 0.70  --  0.73 0.65    Estimated Creatinine Clearance: 82.6 mL/min (by C-G formula based on SCr of 0.65 mg/dL).   Medical History: Past Medical History:  Diagnosis Date   Acute urinary retention 01/04/2022   Altered mental status 08/18/2023   Anxiety    Arthritis    Cataract    CHF (congestive heart failure) (HCC)    Constipation    pt states goes EOD- hard stools    Coronary artery disease    Depression    Diabetes mellitus without complication (HCC)    Diabetic retinopathy (HCC)    DKA (diabetic ketoacidoses)    Hyperlipidemia    Hypertension    off meds   Hypertensive retinopathy    Lesion of ulnar nerve, left upper limb 07/12/2017   Non-intractable vomiting without nausea 06/10/2018   Postherpetic neuralgia 08/11/2021   Speculative diagnosis   Psoriasis    Tubular adenoma of colon 03/2019   UTI (urinary tract infection) 01/05/2022    Medications:  Scheduled:   [MAR Hold] Chlorhexidine  Gluconate Cloth  6 each Topical Daily   [MAR Hold] collagenase   1 Application Topical Daily   [MAR Hold] finasteride   5 mg Oral Daily   [MAR Hold] insulin  aspart  0-5 Units Subcutaneous QHS   [MAR Hold] insulin  aspart  0-9 Units Subcutaneous TID WC   [MAR Hold] mirtazapine   15 mg Oral QHS   pantoprazole   40  mg Oral BID   [MAR Hold] PARoxetine   20 mg Oral Daily   polyethylene glycol powder  119 g Oral Once   Followed by   [START ON 11/25/2023] polyethylene glycol powder  119 g Oral Once   [MAR Hold] QUEtiapine   25 mg Oral QHS   [MAR Hold] sodium chloride  flush  10 mL Intrapleural Q8H   Infusions:   sodium chloride      heparin      sodium chloride  irrigation      Assessment: 68 years of age male with a history of atrial fibrillation (on Apixaban  pta) with slow ventricular response, CAD, CABG x4, aortic aneurysm s/p repair, CVA, DM, and HFpEF who presented with AKI, gross hematuria, and right pleural effusion. Chest tube was placed 11/14 and removed 11/17. GI and urology were consulted. Patient initially refused endoscopy and colonoscopy. CHADS2-VASc score = 4. GI and Urology are ok with resuming anticoagulation. Apixaban  was resumed 11/20 at 5 BID - last dose on 11/21 10:11 AM. Now, Apixaban  therapy discontinued with a tentative plan for EGD and colonoscopy on Sunday. Pharmacy consulted to change to IV heparin  therapy this AM - no bolus with plans to stop therapy ~6 hrs prior to procedure on Sunday.   Heparin   level elevated as expected due to recent apixaban  use.   Patient s/p aborted attempt at colonoscopy. Per GI, may resume heparin  this afternoon and hold again at 0300 on 11/24 for re-attempt tomorrow AM. Last aPTT was 78 sec (therapeutic) on 1000 units/hr.  Goal of Therapy:  Heparin  level 0.3-0.7 units/ml aPTT 66-102 seconds Monitor platelets by anticoagulation protocol: Yes   Plan:  No bolus. Restart heparin  infusion at 1000 units/hr APTT in 6 hours Stop heparin  at 0300 on 11/24 for colonoscopy   Samuele Storey A. Lyle, PharmD, BCPS, FNKF Clinical Pharmacist [place] Please utilize Amion for appropriate phone number to reach the unit pharmacist [facility])

## 2023-11-24 NOTE — Progress Notes (Signed)
 PHARMACY - ANTICOAGULATION CONSULT NOTE  Pharmacy Consult for IV Heparin  Indication: atrial fibrillation  Allergies  Allergen Reactions   Other Hives and Itching    Wool fabric     Patient Measurements: Height: 6' 3 (190.5 cm) Weight: 68 kg (150 lb) IBW/kg (Calculated) : 84.5 HEPARIN  DW (KG): 68  Vital Signs: Temp: 98.3 F (36.8 C) (11/23 1958) Temp Source: Oral (11/23 1432) BP: 182/85 (11/23 1958) Pulse Rate: 62 (11/23 1958)  Labs: Recent Labs    11/22/23 0412 11/23/23 0623 11/23/23 1431 11/24/23 0549 11/24/23 1935  HGB 7.8*  --  8.6* 8.8*  --   HCT 24.7*  --  27.3* 27.3*  --   PLT 196  --  220 217  --   APTT  --  66* 78*  --  64*  HEPARINUNFRC  --  >1.10*  --   --   --   CREATININE 0.70  --  0.73 0.65  --     Estimated Creatinine Clearance: 82.6 mL/min (by C-G formula based on SCr of 0.65 mg/dL).   Medical History: Past Medical History:  Diagnosis Date   Acute urinary retention 01/04/2022   Altered mental status 08/18/2023   Anxiety    Arthritis    Cataract    CHF (congestive heart failure) (HCC)    Constipation    pt states goes EOD- hard stools    Coronary artery disease    Depression    Diabetes mellitus without complication (HCC)    Diabetic retinopathy (HCC)    DKA (diabetic ketoacidoses)    Hyperlipidemia    Hypertension    off meds   Hypertensive retinopathy    Lesion of ulnar nerve, left upper limb 07/12/2017   Non-intractable vomiting without nausea 06/10/2018   Postherpetic neuralgia 08/11/2021   Speculative diagnosis   Psoriasis    Tubular adenoma of colon 03/2019   UTI (urinary tract infection) 01/05/2022    Medications:  Scheduled:   Chlorhexidine  Gluconate Cloth  6 each Topical Daily   collagenase   1 Application Topical Daily   finasteride   5 mg Oral Daily   insulin  aspart  0-5 Units Subcutaneous QHS   insulin  aspart  0-9 Units Subcutaneous TID WC   mirtazapine   15 mg Oral QHS   PARoxetine   20 mg Oral Daily    polyethylene glycol powder  119 g Oral Once   Followed by   NOREEN ON 11/25/2023] polyethylene glycol powder  119 g Oral Once   QUEtiapine   25 mg Oral QHS   sodium chloride  flush  10 mL Intrapleural Q8H   Infusions:   heparin  1,000 Units/hr (11/24/23 1531)   sodium chloride  irrigation      Assessment: 68 years of age male with a history of atrial fibrillation (on Apixaban  pta) with slow ventricular response, CAD, CABG x4, aortic aneurysm s/p repair, CVA, DM, and HFpEF who presented with AKI, gross hematuria, and right pleural effusion. Chest tube was placed 11/14 and removed 11/17. GI and urology were consulted. Patient initially refused endoscopy and colonoscopy. CHADS2-VASc score = 4. GI and Urology are ok with resuming anticoagulation. Apixaban  was resumed 11/20 at 5 BID - last dose on 11/21 10:11 AM. Now, Apixaban  therapy discontinued with a tentative plan for EGD and colonoscopy on Sunday. Pharmacy consulted to change to IV heparin  therapy this AM - no bolus with plans to stop therapy ~6 hrs prior to procedure on Sunday.   Heparin  level elevated as expected due to recent apixaban  use.   Patient  s/p aborted attempt at colonoscopy. Per GI, may resume heparin  this afternoon and hold again at 0300 on 11/24 for re-attempt tomorrow AM. Last aPTT was 78 sec (therapeutic) on 1000 units/hr.  PM update: aPTT 64 sec is slightly subtherapeutic. Heparin  drip paused from 1500-1530. No bleeding issues reported.  Goal of Therapy:  Heparin  level 0.3-0.7 units/ml aPTT 66-102 seconds Monitor platelets by anticoagulation protocol: Yes   Plan:  Increase heparin  infusion to 1100 units/hr Stop heparin  at 0300 on 11/24 for colonoscopy   Rocky Slade, PharmD, BCPS Clinical Pharmacist [place] Please utilize Amion for appropriate phone number to reach the unit pharmacist [facility])

## 2023-11-24 NOTE — Op Note (Signed)
 [facility] Patient Name: [name] [name] Procedure Date : 11/24/2023 MRN: [id number] Attending MD: Sandor Flatter , MD, [id number] Date of Birth: July 28, 1955 CSN: [id number] Age: 68 Admit Type: Inpatient Procedure:                Upper GI endoscopy Indications:              Iron  deficiency anemia, Heme positive stool Providers:                Sandor Flatter, MD, Collene Edu, RN, Lorrayne Kitty,                            Technician Referring MD:              Medicines:                Monitored Anesthesia Care Complications:            No immediate complications. Estimated Blood Loss:     Estimated blood loss was minimal. Procedure:                Pre-Anesthesia Assessment:                           - Prior to the procedure, a History and Physical                            was performed, and patient medications and                            allergies were reviewed. The patient's tolerance of                            previous anesthesia was also reviewed. The risks                            and benefits of the procedure and the sedation                            options and risks were discussed with the patient.                            All questions were answered, and informed consent                            was obtained. Prior Anticoagulants: The patient has                            taken no anticoagulant or antiplatelet agents. ASA                            Grade Assessment: III - A patient with severe                            systemic disease. After reviewing the risks and  benefits, the patient was deemed in satisfactory                            condition to undergo the procedure.                           After obtaining informed consent, the endoscope was                            passed under direct vision. Throughout the                            procedure, the patient's blood pressure, pulse, and                             oxygen saturations were monitored continuously. The                            [id number]-[id number] ([id number]) Olympus endoscope was introduced                            through the mouth, and advanced to the second part                            of duodenum. The upper GI endoscopy was                            accomplished without difficulty. The patient                            tolerated the procedure well. Scope In: Scope Out: Findings:      The examined esophagus was normal.      Localized minimal inflammation characterized by congestion (edema) and       erythema was found in the gastric fundus. Biopsies were taken with a       cold forceps for histology. Estimated blood loss was minimal.      The gastric body, incisura, gastric antrum and pylorus were normal.       Biopsies were taken with a cold forceps for Helicobacter pylori testing.       Estimated blood loss was minimal.      Multiple non-bleeding superficial duodenal ulcers with no stigmata of       bleeding were found in the duodenal bulb, in the first portion of the       duodenum and in the second portion of the duodenum. The largest lesion       was 3 mm in largest dimension. Biopsies were taken with a cold forceps       for histology. Estimated blood loss was minimal. Impression:               - Normal esophagus.                           - Minimal non-ulcer gastritis limited to the  gastric fundus. Biopsied.                           - Normal gastric body, incisura, antrum and                            pylorus. Biopsied.                           - Multiple non-bleeding duodenal ulcers with no                            stigmata of bleeding. Biopsied. Recommendation:           - Await pathology results.                           - Use Protonix  (pantoprazole ) 40 mg PO BID for 6                            weeks to promote mucosal healing of duodenal ulcers.                           - Check  fasting serum gastrin today.                           - Perform a colonoscopy today.                           - Additional recommendations regarding ongoing                            inpatient management pending colonoscopy findings. Procedure Code(s):        --- Professional ---                           [phone], Esophagogastroduodenoscopy, flexible,                            transoral; with biopsy, single or multiple Diagnosis Code(s):        --- Professional ---                           K29.70, Gastritis, unspecified, without bleeding                           K26.9, Duodenal ulcer, unspecified as acute or                            chronic, without hemorrhage or perforation                           D50.9, Iron  deficiency anemia, unspecified                           R19.5, Other fecal abnormalities CPT copyright 2022 American Medical Association. All rights reserved. The codes documented in this report  are preliminary and upon coder review may  be revised to meet current compliance requirements. Sandor Flatter, MD 11/24/2023 11:08:19 AM Number of Addenda: 0

## 2023-11-24 NOTE — Op Note (Signed)
 [facility] Patient Name: [name] [name] Procedure Date : 11/24/2023 MRN: [id number] Attending MD: Sandor Flatter , MD, [id number] Date of Birth: 04-03-1955 CSN: [id number] Age: 68 Admit Type: Inpatient Procedure:                Colonoscopy Indications:              Heme positive stool, Iron  deficiency anemia Providers:                Sandor Flatter, MD, Collene Edu, RN, Lorrayne Kitty,                            Technician Referring MD:              Medicines:                Monitored Anesthesia Care Complications:            No immediate complications. Estimated Blood Loss:     Estimated blood loss: none. Procedure:                Pre-Anesthesia Assessment:                           - Prior to the procedure, a History and Physical                            was performed, and patient medications and                            allergies were reviewed. The patient's tolerance of                            previous anesthesia was also reviewed. The risks                            and benefits of the procedure and the sedation                            options and risks were discussed with the patient.                            All questions were answered, and informed consent                            was obtained. Prior Anticoagulants: The patient has                            taken no anticoagulant or antiplatelet agents. ASA                            Grade Assessment: III - A patient with severe                            systemic disease. After reviewing the risks and  benefits, the patient was deemed in satisfactory                            condition to undergo the procedure.                           After obtaining informed consent, the colonoscope                            was passed under direct vision. Throughout the                            procedure, the patient's blood pressure, pulse, and                             oxygen saturations were monitored continuously. The                            [id number] ([id number]) Olympus colonoscope was                            introduced through the anus with the intention of                            advancing to the cecum. The scope was advanced to                            the rectum before the procedure was aborted.                            Medications were given. The colonoscopy was aborted                            due to poor bowel prep. The colonoscopy was                            technically difficult and complex due to poor bowel                            prep. The patient tolerated the procedure well. The                            quality of the bowel preparation was poor. Scope In: 11:10:37 AM Scope Out: 11:14:24 AM Total Procedure Duration: 0 hours 3 minutes 46 seconds  Findings:      The perianal and digital rectal examinations were normal.      A large amount of solid stool was found in the rectum and in the       recto-sigmoid colon, precluding visualization and unable to advance the       colonoscope. Impression:               - The procedure was aborted due to poor bowel prep.                           -  Preparation of the colon was poor.                           - Stool in the rectum and in the recto-sigmoid                            colon.                           - No specimens collected. Recommendation:           - Return patient to hospital ward for ongoing care.                           - Clear liquid diet today with attempt at repeat                            bowel prep today and will attempt colonoscopy again                            tomorrow.                           - Continue present medications.                           - Resume heparin  this afternoon, then hold 4 hours                            prior to colonoscopy tomorrow.                           - Results discussed with patient in the recovery                             unit.                           - Results conveyed to the primary Hospitalist                            service. Procedure Code(s):        --- Professional ---                           [phone], 53, Colonoscopy, flexible; diagnostic,                            including collection of specimen(s) by brushing or                            washing, when performed (separate procedure) Diagnosis Code(s):        --- Professional ---                           R19.5, Other fecal abnormalities  D50.9, Iron  deficiency anemia, unspecified CPT copyright 2022 American Medical Association. All rights reserved. The codes documented in this report are preliminary and upon coder review may  be revised to meet current compliance requirements. Sandor Flatter, MD 11/24/2023 11:22:38 AM Number of Addenda: 0

## 2023-11-24 NOTE — Plan of Care (Signed)

## 2023-11-24 NOTE — Interval H&P Note (Signed)
 History and Physical Interval Note:  11/24/2023 10:02 AM  [name] [name]  has presented today for surgery, with the diagnosis of Iron  deficiency anemia, heme positive stool.  The various methods of treatment have been discussed with the patient and family. After consideration of risks, benefits and other options for treatment, the patient has consented to  Procedure(s): COLONOSCOPY (N/A) EGD (ESOPHAGOGASTRODUODENOSCOPY) (N/A) as a surgical intervention.  The patient's history has been reviewed, patient examined, no change in status, stable for surgery.  I have reviewed the patient's chart and labs.  Questions were answered to the patient's satisfaction.     Sandor GAILS Mirel Hundal

## 2023-11-24 NOTE — Progress Notes (Signed)
 Pt drunk the prep, but refused to drink more water  after the prep. Pt stated I will not drink anything anymore   On call provider notified and aware.

## 2023-11-24 NOTE — Progress Notes (Signed)
 Pt. Refusing to drink colon prep. State he wishes not to have test done and he will talk to MD tomorrow

## 2023-11-24 NOTE — Progress Notes (Signed)
 PROGRESS NOTE    [name]  FMW:[id number] DOB: [birth year] DOA: 11/12/2023 PCP: [name] HERO, PA-C   Brief Narrative: [name] [name] is a 68 y.o. male with a history of atrial fibrillation with slow ventricular response, CAD, CABG x 4, aortic aneurysm s/p repair, CVA, diabetes mellitus type 2, heart failure with preserved EF. Patient presented secondary to abnormal metabolic panel and while in the emergency department found to have evidence of gross hematuria, anemia and AKI. Urology consulted. Patient started IV fluids. During workup, patient was Hemoccult positive, GI consulted. Hemoglobin continued to drift down requiring 1 unit PRBC via transfusion. CT imaging on admission was significant for right pleural effusion concerning for possible empyema. Pulmonology consulted for chest tube placement which was performed on 11/14 which was removed on 11/17.   Assessment and Plan:  Gross hematuria Possibly related to foley catheter trauma and complicated by Eliquis  use. Urology consulted. CT abdomen/pelvis significant for normal kidneys without hydronephrosis, nonobstructing stone versus vascular calcification of the upper pole left kidney, no renal mass.  Hematuria catheter was declined by patient.  Per urology, possible hemorrhagic cystitis. Hematuria catheter placed and CBI started on 11/14. Urine culture positive for 20,000 colonies/ml of staphylococcus hemolyticus. Patient completed antibiotics. Bleeding improved. Recommendation for finasteride  on discharge.  Possible UTI Urine culture obtained. Patient started empirically on Ceftriaxone . Urinalysis with rare bacteruria, pyuria and is nitrite positive; significant squamous cells. Urine culture positive for 20,000 colonies/ml of staphylococcus hemolyticus. Patient completed Bactrim .  Acute blood loss anemia Chronic iron  deficiency anemia Baseline hemoglobin appears to be around 8-9.  Hemoglobin of 7.5 on admission with  downtrend to 6.7 likely related to patient's gross hematuria in addition to heme positive stool.  1 unit of PRBC ordered on 11/13 with posttransfusion hemoglobin up to 8.5.  Hemoglobin stable. Upper endoscopy performed on 11/23 significant for non-ulcer gastritis (biopsied) and multiple non-bleeding duodenal ulcers (biopsied). -Follow-up biopsies -GI recommendations: Protonix  40 mg BID x6 weeks; plan for colonoscopy on 11/24  AKI Present on admission with creatinine of 1.48 and peak of 1.50. Resolved.  Heme positive stool No history of hematochezia or melena.  BUN significantly elevated on admission, but patient also has evidence of dehydration.  Complicated by Eliquis  use. GI consulted and have recommended inpatient EGD/Colonoscopy. Patient continues to adamantly decline endoscopy procedures.  Paroxysmal atrial fibrillation with slow ventricular response Patient is not on rate control medication secondary to slow ventricular response.  He is managed on Eliquis  secondary to a CHA2DS2-VASc of 4.  Eliquis  held secondary to hematuria and associated acute anemia.  Right pleural effusion Noted on CT imaging with evidence of moderate-sized collection.  Concern for possible empyema secondary to gas seen on imaging.  Last thoracentesis was from February 2025, which was also right-sided and was secondary to heart failure.  Patient is completely asymptomatic.  Pulmonology consulted and placed a right-sided chest tube on 11/14 and have applied lytics for management of loculated effusion. Chest tube successfully removed on 11/17. Antibiotics completed.  Diabetes mellitus type 2 Uncontrolled with hyperglycemia based on last hemoglobin A1c of 8.3%.  CAD S/p CABG x4.  Aortic atherosclerosis Noted on CT imaging.  Aortic aneurysm Stable.  Chronic HFpEF Currently stable.  Peripheral vascular disease Noted. Not on medication therapy.  Ambulatory dysfunction Noted. PT/OT recommending SNF, however  patient is declining at this time.  Left foot wound Noted.  Generalized anxiety disorder Depression -Continue Paxil  and Remeron   Cognitive dysfunction Noted. -Continue Seroquel    DVT prophylaxis: SCDs  Code Status:   Code Status: Full Code Family Communication: None at bedside Disposition Plan: Discharge pending ongoing specialist recommendations, stable hemoglobin, removal of chest tube   Consultants:  Gastroenterology Urology PCCM  Procedures:  Chest tube placement Upper endoscopy  Antimicrobials: Ceftriaxone     Subjective: No concerns this morning.  Objective: BP (!) 156/78 (BP Location: Right Arm)   Pulse 61   Temp 97.9 F (36.6 C) (Oral)   Resp 18   Ht 6' 3 (1.905 m)   Wt 68 kg   SpO2 100%   BMI 18.75 kg/m   Examination:  General exam: Appears calm and comfortable. Respiratory system: Clear to auscultation. Respiratory effort normal. Cardiovascular system: S1 & S2 heard, RRR. No murmur. Gastrointestinal system: Abdomen is nondistended, soft and nontender. Normal bowel sounds heard. Central nervous system: Alert and oriented to person place and time. Musculoskeletal: No edema. No calf tenderness Psychiatry: Short-term memory is impaired. Mood & affect appropriate.    Data Reviewed: I have personally reviewed following labs and imaging studies  CBC Lab Results  Component Value Date   WBC 5.3 11/24/2023   RBC 3.12 (L) 11/24/2023   HGB 8.8 (L) 11/24/2023   HCT 27.3 (L) 11/24/2023   MCV 87.5 11/24/2023   MCH 28.2 11/24/2023   PLT 217 11/24/2023   MCHC 32.2 11/24/2023   RDW 17.1 (H) 11/24/2023   LYMPHSABS 0.8 11/24/2023   MONOABS 0.5 11/24/2023   EOSABS 0.1 11/24/2023   BASOSABS 0.1 11/24/2023     Last metabolic panel Lab Results  Component Value Date   NA 135 11/24/2023   K 5.1 11/24/2023   CL 102 11/24/2023   CO2 24 11/24/2023   BUN 8 11/24/2023   CREATININE 0.65 11/24/2023   GLUCOSE 125 (H) 11/24/2023   GFRNONAA >60  11/24/2023   GFRAA >60 06/09/2018   CALCIUM  8.3 (L) 11/24/2023   PHOS 3.5 11/24/2023   PROT 6.2 (L) 11/24/2023   ALBUMIN 2.2 (L) 11/24/2023   LABGLOB 3.8 08/26/2023   BILITOT 1.0 11/24/2023   ALKPHOS 132 (H) 11/24/2023   AST 17 11/24/2023   ALT 7 11/24/2023   ANIONGAP 9 11/24/2023    GFR: Estimated Creatinine Clearance: 82.6 mL/min (by C-G formula based on SCr of 0.65 mg/dL).  Recent Results (from the past 240 hours)  Body fluid culture w Gram Stain     Status: None   Collection Time: 11/15/23  5:29 PM   Specimen: Pleural Fluid  Result Value Ref Range Status   Specimen Description PLEURAL  Final   Special Requests RIGHT  Final   Gram Stain NO WBC SEEN NO ORGANISMS SEEN   Final   Culture   Final    NO GROWTH 3 DAYS Performed at [facility], [address], [place], [place] [address]    Report Status 11/18/2023 FINAL  Final  Fungus Culture With Stain     Status: None (Preliminary result)   Collection Time: 11/15/23  5:30 PM   Specimen: Pleural, Right; Pleural Fluid  Result Value Ref Range Status   Fungus Stain Final report  Final    Comment: (NOTE) Performed At: [facility] [address] [place], [place] [id number] Jennette Shorter MD Ey:[id number]    Fungus (Mycology) Culture PENDING  Incomplete   Fungal Source RIGHT PLEURAL  Final    Comment: Performed at [facility], [address], [place], [place] [address]  Fungus Culture Result     Status: None   Collection Time: 11/15/23  5:30 PM  Result Value Ref Range Status   Result 1 Comment  Final    Comment: (NOTE) KOH/Calcofluor preparation:  no fungus observed. Performed At: [facility] [address] [place], [place] [id number] Jennette Shorter MD Ey:[phone]       Radiology Studies: No results found.     LOS: 11 days    Elgin Lam, MD [facility] 11/24/2023, 5:38 PM   If 7PM-7AM, please contact night-coverage [email or web address]

## 2023-11-24 NOTE — Anesthesia Preprocedure Evaluation (Addendum)
 Anesthesia Evaluation  Patient identified by MRN, date of birth, ID band Patient awake    Reviewed: Allergy & Precautions, NPO status , Patient's Chart, lab work & pertinent test results  History of Anesthesia Complications Negative for: history of anesthetic complications  Airway Mallampati: II  TM Distance: >3 FB Neck ROM: Full    Dental  (+) Dental Advisory Given, Teeth Intact,    Pulmonary neg shortness of breath, neg sleep apnea, neg COPD, neg recent URI, Current Smoker and Patient abstained from smoking.   breath sounds clear to auscultation       Cardiovascular hypertension, Pt. on medications + CAD, + CABG, + Peripheral Vascular Disease and +CHF   Rhythm:Irregular  1. Left ventricular ejection fraction, by estimation, is 55 to 60%. The  left ventricle has normal function. The left ventricle has no regional  wall motion abnormalities. There is mild asymmetric left ventricular  hypertrophy of the basal-septal segment.  Left ventricular diastolic parameters are consistent with Grade II  diastolic dysfunction (pseudonormalization).   2. Right ventricular systolic function is normal. The right ventricular  size is normal. Tricuspid regurgitation signal is inadequate for assessing  PA pressure.   3. The mitral valve is normal in structure. Trivial mitral valve  regurgitation. No evidence of mitral stenosis.   4. The aortic valve is tricuspid. There is moderate calcification of the  aortic valve. Aortic valve regurgitation is not visualized. Aortic valve  sclerosis/calcification is present, without any evidence of aortic  stenosis.   5. The inferior vena cava is dilated in size with <50% respiratory  variability, suggesting right atrial pressure of 15 mmHg.   6. Left pleural effusion noted.     Neuro/Psych  PSYCHIATRIC DISORDERS Anxiety Depression     Neuromuscular disease    GI/Hepatic Neg liver ROS,,,? GI bleed    Endo/Other  diabetes    Renal/GU Lab Results      Component                Value               Date                      NA                       135                 11/24/2023                K                        5.1                 11/24/2023                CO2                      24                  11/24/2023                GLUCOSE                  125 (H)             11/24/2023                BUN  8                   11/24/2023                CREATININE               0.65                11/24/2023                CALCIUM                   8.3 (L)             11/24/2023                GFR                      71.12               07/26/2023                GFRNONAA                 >60                 11/24/2023                Musculoskeletal  (+) Arthritis ,    Abdominal   Peds  Hematology  (+) Blood dyscrasia, anemia Lab Results      Component                Value               Date                      WBC                      5.3                 11/24/2023                HGB                      8.8 (L)             11/24/2023                HCT                      27.3 (L)            11/24/2023                MCV                      87.5                11/24/2023                PLT                      217                 11/24/2023              Anesthesia Other Findings   Reproductive/Obstetrics  Anesthesia Physical Anesthesia Plan  ASA: 3  Anesthesia Plan: MAC   Post-op Pain Management: Minimal or no pain anticipated   Induction: Intravenous  PONV Risk Score and Plan: 1 and Propofol  infusion  Airway Management Planned: Nasal Cannula, Natural Airway and Simple Face Mask  Additional Equipment: None  Intra-op Plan:   Post-operative Plan:   Informed Consent: I have reviewed the patients History and Physical, chart, labs and discussed the procedure including the risks, benefits  and alternatives for the proposed anesthesia with the patient or authorized representative who has indicated his/her understanding and acceptance.     Dental advisory given  Plan Discussed with: CRNA  Anesthesia Plan Comments:          Anesthesia Quick Evaluation

## 2023-11-24 NOTE — Progress Notes (Signed)
 Pt currently off unit for Colonoscopy and Endoscopy.

## 2023-11-24 NOTE — Transfer of Care (Signed)
 Immediate Anesthesia Transfer of Care Note  Patient: [name] [name]  Procedure(s) Performed: COLONOSCOPY EGD (ESOPHAGOGASTRODUODENOSCOPY)  Patient Location: PACU  Anesthesia Type:MAC  Level of Consciousness: awake, alert , and patient cooperative  Airway & Oxygen Therapy: Patient Spontanous Breathing  Post-op Assessment: Report given to RN, Post -op Vital signs reviewed and stable, and Patient moving all extremities X 4  Post vital signs: Reviewed and stable  Last Vitals:  Vitals Value Taken Time  BP 153/131 11/24/23 11:26  Temp    Pulse 64 11/24/23 11:27  Resp 17 11/24/23 11:27  SpO2 97 % 11/24/23 11:27  Vitals shown include unfiled device data.  Last Pain:  Vitals:   11/24/23 0943  TempSrc: Temporal  PainSc: 0-No pain      Patients Stated Pain Goal: 0 (11/18/23 0015)  Complications: No notable events documented.

## 2023-11-25 DIAGNOSIS — J90 Pleural effusion, not elsewhere classified: Secondary | ICD-10-CM | POA: Diagnosis not present

## 2023-11-25 DIAGNOSIS — J869 Pyothorax without fistula: Secondary | ICD-10-CM | POA: Diagnosis not present

## 2023-11-25 DIAGNOSIS — R195 Other fecal abnormalities: Secondary | ICD-10-CM | POA: Diagnosis not present

## 2023-11-25 DIAGNOSIS — R310 Gross hematuria: Secondary | ICD-10-CM | POA: Diagnosis not present

## 2023-11-25 LAB — BASIC METABOLIC PANEL WITH GFR
Anion gap: 11 (ref 5–15)
BUN: 8 mg/dL (ref 8–23)
CO2: 22 mmol/L (ref 22–32)
Calcium: 7.9 mg/dL — ABNORMAL LOW (ref 8.9–10.3)
Chloride: 101 mmol/L (ref 98–111)
Creatinine, Ser: 0.68 mg/dL (ref 0.61–1.24)
GFR, Estimated: >60 mL/min (ref >60)
Glucose, Bld: 126 mg/dL — ABNORMAL HIGH (ref 70–99)
Potassium: 4.2 mmol/L (ref 3.5–5.1)
Sodium: 134 mmol/L — ABNORMAL LOW (ref 135–145)

## 2023-11-25 LAB — APTT
aPTT: 60 s — ABNORMAL HIGH (ref 24–36)
aPTT: 79 s — ABNORMAL HIGH (ref 24–36)

## 2023-11-25 LAB — GLUCOSE, CAPILLARY
Glucose-Capillary: 120 mg/dL — ABNORMAL HIGH (ref 70–99)
Glucose-Capillary: 124 mg/dL — ABNORMAL HIGH (ref 70–99)
Glucose-Capillary: 192 mg/dL — ABNORMAL HIGH (ref 70–99)

## 2023-11-25 LAB — HEPARIN LEVEL (UNFRACTIONATED)
Heparin Unfractionated: 0.35 [IU]/mL (ref 0.30–0.70)
Heparin Unfractionated: 0.44 [IU]/mL (ref 0.30–0.70)

## 2023-11-25 MED ORDER — LORAZEPAM 2 MG/ML IJ SOLN
0.2500 mg | Freq: Once | INTRAMUSCULAR | Status: AC | PRN
  Administered 2023-11-25: 0.25 mg via INTRAVENOUS
  Filled 2023-11-25: qty 1

## 2023-11-25 MED ORDER — HEPARIN (PORCINE) 25000 UT/250ML-% IV SOLN
1100.0000 [IU]/h | INTRAVENOUS | Status: DC
  Administered 2023-11-25 – 2023-11-26 (×2): 1100 [IU]/h via INTRAVENOUS
  Filled 2023-11-25 (×2): qty 250

## 2023-11-25 NOTE — Progress Notes (Signed)
 Physical Therapy Treatment Patient Details Name: [name] [name] MRN: [id number] DOB: 23-Feb-1955 Today's Date: 11/25/2023   History of Present Illness 68 y.o. male admitted 11/12/23 for abnormal renal labs. Found to have AKI and potential UTI. Foley catheter was inserted.  He has since been having hematuria, possibly trauma in the setting of Eliquis . Pt with worsening of chronic normocytic anemia. Chest CT showed pleural effusion. Plan for EGD and colonoscopy. PMHx: PAF, chronic A-Fib, CHF, CAD s/p CABG, PAD, T2DM, HTN, HLD, chronic decubitus ulcer,  chronic anemia with heme positive stool, and chronic diarrhea. Of note, recent hospitalization 10/22-11/5 after fall sustaining L SDH with 4mm shift.    PT Comments  Pt initially eager to mobilize stating The Lord healed my knees two days ago and I don't need a walker. Pt then became anxious during amb with rollator and demo'd decreased safety awareness requiring modA and max directional verbal cues to safely return to chair. Pt with poor understanding regarding situation and place. Pt talking and referring to brothers and son who aren't present in the room. Pt initially refusing to return to room because I want to go back to my room. This isn't my room.   Pt continues to have both cognitive and functional deficits and continues to benefit from inpatient rehab program < 3 hrs a day to address deficits and progress to a safe level of mobility. Acute PT to cont to follow.    If plan is discharge home, recommend the following: Assistance with cooking/housework;Assist for transportation;Help with stairs or ramp for entrance;Supervision due to cognitive status;Direct supervision/assist for medications management;A little help with walking and/or transfers;A lot of help with bathing/dressing/bathroom;Direct supervision/assist for financial management   Can travel by private vehicle     Yes  Equipment Recommendations  Wheelchair cushion (measurements  PT);Wheelchair (measurements PT);Hospital bed;BSC/3in1    Recommendations for Other Services OT consult     Precautions / Restrictions Precautions Precautions: Fall Recall of Precautions/Restrictions: Impaired Precaution/Restrictions Comments: low vision Restrictions Weight Bearing Restrictions Per Provider Order: No     Mobility  Bed Mobility Overal bed mobility: Needs Assistance Bed Mobility: Sit to Supine, Supine to Sit   Sidelying to sit: Min assist, HOB elevated, Used rails Supine to sit: HOB elevated, Used rails, Min assist Sit to supine: Supervision   General bed mobility comments: Min A to lift trunk to sit EOB, able to bring LE back into bed at end of session    Transfers Overall transfer level: Needs assistance Equipment used: Rolling walker (2 wheels) Transfers: Sit to/from Stand Sit to Stand: Mod assist, +2 physical assistance   Step pivot transfers: Mod assist       General transfer comment: pt stating he doesn't need rollator anymore because The Lord healed my knees two days ago' however asked and required min/modAx2 to power up from elevated surface    Ambulation/Gait Ambulation/Gait assistance: +2 safety/equipment, Mod assist Gait Distance (Feet): 40 Feet Assistive device: Rollator (4 wheels) Gait Pattern/deviations: Decreased step length - right, Decreased step length - left, Decreased stride length, Step-through pattern, Trunk flexed Gait velocity: reduced Gait velocity interpretation: <1.31 ft/sec, indicative of household ambulator   General Gait Details: pt amb out the door and then became anxious and asked to turn around, pt informed there was a chair behind him if he needed to sit but continued to turn and return to room asking for a place to sit. pt with no command follow or ability to problem solve   Stairs  Wheelchair Mobility     Tilt Bed    Modified Rankin (Stroke Patients Only)       Balance Overall balance  assessment: History of Falls, Needs assistance Sitting-balance support: No upper extremity supported, Feet supported Sitting balance-Leahy Scale: Fair Sitting balance - Comments: supervision sitting statically EOB   Standing balance support: Bilateral upper extremity supported, During functional activity, Reliant on assistive device for balance Standing balance-Leahy Scale: Poor Standing balance comment: reliant on rollator and intermittent minA                            Communication Communication Communication: Impaired Factors Affecting Communication: Hearing impaired  Cognition Arousal: Alert Behavior During Therapy: Flat affect   PT - Cognitive impairments: No family/caregiver present to determine baseline                       PT - Cognition Comments: pt able to state date however is confused, not aware he is in the hospital, talking about wanting to know if his brothers can stay in the same room as him and if we could check on his son's situation. Pt with poor recall of being in hospital and roll of PT. pt with decreased insight to deficits and safety Following commands: Impaired Following commands impaired: Follows one step commands with increased time, Follows one step commands inconsistently    Cueing Cueing Techniques: Verbal cues, Tactile cues  Exercises      General Comments General comments (skin integrity, edema, etc.): VSS      Pertinent Vitals/Pain Pain Assessment Pain Assessment: No/denies pain    Home Living                          Prior Function            PT Goals (current goals can now be found in the care plan section) Acute Rehab PT Goals PT Goal Formulation: With patient Time For Goal Achievement: 11/28/23 Potential to Achieve Goals: Fair Progress towards PT goals: Progressing toward goals    Frequency    Min 2X/week      PT Plan      Co-evaluation              AM-PAC PT 6 Clicks Mobility    Outcome Measure  Help needed turning from your back to your side while in a flat bed without using bedrails?: A Little Help needed moving from lying on your back to sitting on the side of a flat bed without using bedrails?: A Little Help needed moving to and from a bed to a chair (including a wheelchair)?: A Little Help needed standing up from a chair using your arms (e.g., wheelchair or bedside chair)?: A Lot Help needed to walk in hospital room?: Total Help needed climbing 3-5 steps with a railing? : Total 6 Click Score: 13    End of Session Equipment Utilized During Treatment: Gait belt Activity Tolerance: Patient tolerated treatment well Patient left: in bed;with call bell/phone within reach;with bed alarm set (pt refused to sit up in chair due to pain in bottom when sitting in chair) Nurse Communication: Mobility status PT Visit Diagnosis: Unsteadiness on feet (R26.81);Other abnormalities of gait and mobility (R26.89);Muscle weakness (generalized) (M62.81);History of falling (Z91.81);Dizziness and giddiness (R42);Difficulty in walking, not elsewhere classified (R26.2)     Time: 9046-8985 PT Time Calculation (min) (ACUTE ONLY): 21 min  Charges:    $  Gait Training: 8-22 mins PT General Charges $$ ACUTE PT VISIT: 1 Visit                     Norene Ames, PT, DPT Acute Rehabilitation Services Secure chat preferred Office #: [phone]    Norene CHRISTELLA Ames 11/25/2023, 1:27 PM

## 2023-11-25 NOTE — Progress Notes (Signed)
 MD Lynwood Kipper give ok to restart Heparin  with pharmacy consult

## 2023-11-25 NOTE — Progress Notes (Signed)
 Pt. Continues to refused colon prep, alert x 3 disoriented time he is able to answer all question appropriately and follow command, but talks to his son who is not in the room

## 2023-11-25 NOTE — Progress Notes (Signed)
 Pt is combative while assisting back to bed

## 2023-11-25 NOTE — Progress Notes (Signed)
 PROGRESS NOTE    [name] [name]  FMW:[id number] DOB: [birth year] DOA: 11/12/2023 PCP: [name] [name] HERO, PA-C   Brief Narrative: [name] [name] is a 68 y.o. male with a history of atrial fibrillation with slow ventricular response, CAD, CABG x 4, aortic aneurysm s/p repair, CVA, diabetes mellitus type 2, heart failure with preserved EF. Patient presented secondary to abnormal metabolic panel and while in the emergency department found to have evidence of gross hematuria, anemia and AKI. Urology consulted. Patient started IV fluids. During workup, patient was Hemoccult positive, GI consulted. Hemoglobin continued to drift down requiring 1 unit PRBC via transfusion. CT imaging on admission was significant for right pleural effusion concerning for possible empyema. Pulmonology consulted for chest tube placement which was performed on 11/14 which was removed on 11/17.   Assessment and Plan:  Gross hematuria Possibly related to foley catheter trauma and complicated by Eliquis  use. Urology consulted. CT abdomen/pelvis significant for normal kidneys without hydronephrosis, nonobstructing stone versus vascular calcification of the upper pole left kidney, no renal mass.  Hematuria catheter was declined by patient.  Per urology, possible hemorrhagic cystitis. Hematuria catheter placed and CBI started on 11/14. Urine culture positive for 20,000 colonies/ml of staphylococcus hemolyticus. Patient completed antibiotics. Bleeding improved. Recommendation for finasteride  on discharge.  Possible UTI Urine culture obtained. Patient started empirically on Ceftriaxone . Urinalysis with rare bacteruria, pyuria and is nitrite positive; significant squamous cells. Urine culture positive for 20,000 colonies/ml of staphylococcus hemolyticus. Patient completed Bactrim .  Acute blood loss anemia Chronic iron  deficiency anemia Baseline hemoglobin appears to be around 8-9.  Hemoglobin of 7.5 on admission with  downtrend to 6.7 likely related to patient's gross hematuria in addition to heme positive stool.  1 unit of PRBC ordered on 11/13 with posttransfusion hemoglobin up to 8.5.  Hemoglobin stable.  AKI Present on admission with creatinine of 1.48 and peak of 1.50. Resolved.  Heme positive stool No history of hematochezia or melena.  BUN significantly elevated on admission, but patient also has evidence of dehydration.  Complicated by Eliquis  use. GI consulted and have recommended inpatient EGD/Colonoscopy. Patient initially declined endoscopic procedures, but eventually agreed. Upper endoscopy performed on 11/23 significant for non-ulcer gastritis (biopsied) and multiple non-bleeding duodenal ulcers (biopsied). Plan for colonoscopy deferred secondary to patient refusing bowel prep. -Follow-up biopsies -GI recommendations: Protonix  40 mg BID x6 weeks  Paroxysmal atrial fibrillation with slow ventricular response Patient is not on rate control medication secondary to slow ventricular response.  He is managed on Eliquis  secondary to a CHA2DS2-VASc of 4.  Eliquis  held secondary to hematuria and associated acute anemia.  Right pleural effusion Noted on CT imaging with evidence of moderate-sized collection.  Concern for possible empyema secondary to gas seen on imaging.  Last thoracentesis was from February 2025, which was also right-sided and was secondary to heart failure.  Patient is completely asymptomatic.  Pulmonology consulted and placed a right-sided chest tube on 11/14 and have applied lytics for management of loculated effusion. Chest tube successfully removed on 11/17. Antibiotics completed.  Diabetes mellitus type 2 Uncontrolled with hyperglycemia based on last hemoglobin A1c of 8.3%.  CAD S/p CABG x4.  Aortic atherosclerosis Noted on CT imaging.  Aortic aneurysm Stable.  Chronic HFpEF Currently stable.  Peripheral vascular disease Noted. Not on medication therapy.  Ambulatory  dysfunction Noted. PT/OT recommending SNF, however patient is declining at this time.  Left foot wound Noted.  Generalized anxiety disorder Depression -Continue Paxil  and Remeron   Cognitive dysfunction Noted. History  of severe delirium during prior hospitalization. Patient now with concerns for hallucinations. Sleep appears to be compromised. May benefit from increase to Seroquel  dose. Patient has intermittent confusion. Currently, he lives alone. Long discussion with son on 11/24. Discussed limitations if patient shows evidence of capacity as it relates to his desire/request to be discharged. I discussed that in similar situations, family generally needs to provide assistance to their loved one to improve safety concerns when patient has capacity to make decisions we may disagree with. Son continues to reiterate that we would be discharging unsafely. We discussed caregiver support, however son states they cannot afford this.  -Consult psychiatry for recommendations -Continue Seroquel    DVT prophylaxis: SCDs Code Status:   Code Status: Full Code Family Communication: None at bedside Disposition Plan: Discharge pending improvement in cognitive impairment.    Consultants:  Gastroenterology Urology PCCM  Procedures:  Chest tube placement Upper endoscopy  Antimicrobials: Ceftriaxone     Subjective: Patient mentions that [name] [name] has a place attached to the current facility. When attempting to clarify what [name] means, he becomes impatient.   Objective: BP (!) 160/69 (BP Location: Right Arm)   Pulse (!) 51   Temp 97.9 F (36.6 C)   Resp 19   Ht 6' 3 (1.905 m)   Wt 68 kg   SpO2 99%   BMI 18.75 kg/m   Examination:  General exam: Appears calm and comfortable. Respiratory system: Clear to auscultation. Respiratory effort normal. Cardiovascular system: S1 & S2 heard, RRR. Gastrointestinal system: Abdomen is nondistended, soft and nontender. Normal bowel sounds  heard. Central nervous system: Alert and oriented. Psychiatry: Short-term memory is impaired. Mood & affect appropriate. Denies hallucinations although speaks about [name] [name] being next door.   Data Reviewed: I have personally reviewed following labs and imaging studies  CBC Lab Results  Component Value Date   WBC 5.3 11/24/2023   RBC 3.12 (L) 11/24/2023   HGB 8.8 (L) 11/24/2023   HCT 27.3 (L) 11/24/2023   MCV 87.5 11/24/2023   MCH 28.2 11/24/2023   PLT 217 11/24/2023   MCHC 32.2 11/24/2023   RDW 17.1 (H) 11/24/2023   LYMPHSABS 0.8 11/24/2023   MONOABS 0.5 11/24/2023   EOSABS 0.1 11/24/2023   BASOSABS 0.1 11/24/2023     Last metabolic panel Lab Results  Component Value Date   NA 134 (L) 11/25/2023   K 4.2 11/25/2023   CL 101 11/25/2023   CO2 22 11/25/2023   BUN 8 11/25/2023   CREATININE 0.68 11/25/2023   GLUCOSE 126 (H) 11/25/2023   GFRNONAA >60 11/25/2023   GFRAA >60 06/09/2018   CALCIUM  7.9 (L) 11/25/2023   PHOS 3.5 11/24/2023   PROT 6.2 (L) 11/24/2023   ALBUMIN 2.2 (L) 11/24/2023   LABGLOB 3.8 08/26/2023   BILITOT 1.0 11/24/2023   ALKPHOS 132 (H) 11/24/2023   AST 17 11/24/2023   ALT 7 11/24/2023   ANIONGAP 11 11/25/2023    GFR: Estimated Creatinine Clearance: 82.6 mL/min (by C-G formula based on SCr of 0.68 mg/dL).  Recent Results (from the past 240 hours)  Body fluid culture w Gram Stain     Status: None   Collection Time: 11/15/23  5:29 PM   Specimen: Pleural Fluid  Result Value Ref Range Status   Specimen Description PLEURAL  Final   Special Requests RIGHT  Final   Gram Stain NO WBC SEEN NO ORGANISMS SEEN   Final   Culture   Final    NO GROWTH 3 DAYS Performed  at [facility], [address], [place], [place] [address]    Report Status 11/18/2023 FINAL  Final  Fungus Culture With Stain     Status: None (Preliminary result)   Collection Time: 11/15/23  5:30 PM   Specimen: Pleural, Right; Pleural Fluid  Result Value Ref Range Status    Fungus Stain Final report  Final    Comment: (NOTE) Performed At: [facility] [address] [place], [place] [id number] Jennette Shorter MD Ey:[id number]    Fungus (Mycology) Culture PENDING  Incomplete   Fungal Source RIGHT PLEURAL  Final    Comment: Performed at [facility], [address] [place], [place] [address]  Fungus Culture Result     Status: None   Collection Time: 11/15/23  5:30 PM  Result Value Ref Range Status   Result 1 Comment  Final    Comment: (NOTE) KOH/Calcofluor preparation:  no fungus observed. Performed At: [facility] [address] [place], [place] [id number] Jennette Shorter MD Ey:[id number]       Radiology Studies: No results found.     LOS: 12 days    Elgin Lam, MD [facility] 11/25/2023, 8:44 AM   If 7PM-7AM, please contact night-coverage [email or web address]

## 2023-11-25 NOTE — Progress Notes (Signed)
 PHARMACY - ANTICOAGULATION CONSULT NOTE  Pharmacy Consult for IV Heparin  Indication: atrial fibrillation  Allergies  Allergen Reactions   Other Hives and Itching    Wool fabric     Patient Measurements: Height: 6' 3 (190.5 cm) Weight: 68 kg (150 lb) IBW/kg (Calculated) : 84.5 HEPARIN  DW (KG): 68  Vital Signs: Temp: 98 F (36.7 C) (11/24 1321) BP: 160/74 (11/24 1321) Pulse Rate: 57 (11/24 1321)  Labs: Recent Labs    11/23/23 0623 11/23/23 1431 11/24/23 0549 11/24/23 1935 11/25/23 0520 11/25/23 1133 11/25/23 2203  HGB  --  8.6* 8.8*  --   --   --   --   HCT  --  27.3* 27.3*  --   --   --   --   PLT  --  220 217  --   --   --   --   APTT 66* 78*  --  64*  --  60* 79*  HEPARINUNFRC >1.10*  --   --   --   --  0.35 0.44  CREATININE  --  0.73 0.65  --  0.68  --   --     Estimated Creatinine Clearance: 82.6 mL/min (by C-G formula based on SCr of 0.68 mg/dL).   Assessment: 68 years of age male with a history of atrial fibrillation (on Apixaban  pta) with slow ventricular response, CAD, CABG x4, aortic aneurysm s/p repair, CVA, DM, and HFpEF who presented with AKI, gross hematuria, and right pleural effusion. Chest tube was placed 11/14 and removed 11/17. GI and urology were consulted. Patient initially refused endoscopy and colonoscopy. CHADS2-VASc score = 4. GI and Urology are ok with resuming anticoagulation. Apixaban  was resumed 11/20 at 5 BID - last dose on 11/21 10:11 AM. Now, Apixaban  therapy discontinued with a tentative plan for EGD and colonoscopy on Sunday.  Heparin  level elevated as expected due to recent apixaban  use.    Pt has refused the colonoscopy prep on 11/24 - heparin  restarted.   HL 0.44, aPTT 79 - both therapeutic  Goal of Therapy:  Heparin  level 0.3-0.7 units/ml aPTT 66-102 seconds Monitor platelets by anticoagulation protocol: Yes   Plan:  Continue heparin  infusion at 1100 units/hr Daily HL, CBC F/u procedure plans and transition back to DOAC  therapy   Sharyne Glatter, PharmD, BCCCP Critical Care Clinical Pharmacist 11/25/2023 10:38 PM

## 2023-11-25 NOTE — Progress Notes (Signed)
 On call Gastro MD cancelled procedure of colonoscopy

## 2023-11-25 NOTE — Progress Notes (Signed)
 PHARMACY - ANTICOAGULATION CONSULT NOTE  Pharmacy Consult for IV Heparin  Indication: atrial fibrillation  Allergies  Allergen Reactions   Other Hives and Itching    Wool fabric     Patient Measurements: Height: 6' 3 (190.5 cm) Weight: 68 kg (150 lb) IBW/kg (Calculated) : 84.5 HEPARIN  DW (KG): 68  Vital Signs: Temp: 97.9 F (36.6 C) (11/24 0800) BP: 160/69 (11/24 0800) Pulse Rate: 51 (11/24 0800)  Labs: Recent Labs    11/23/23 0623 11/23/23 1431 11/24/23 0549 11/24/23 1935 11/25/23 0520 11/25/23 1133  HGB  --  8.6* 8.8*  --   --   --   HCT  --  27.3* 27.3*  --   --   --   PLT  --  220 217  --   --   --   APTT 66* 78*  --  64*  --  60*  HEPARINUNFRC >1.10*  --   --   --   --  0.35  CREATININE  --  0.73 0.65  --  0.68  --     Estimated Creatinine Clearance: 82.6 mL/min (by C-G formula based on SCr of 0.68 mg/dL).   Assessment: 68 years of age male with a history of atrial fibrillation (on Apixaban  pta) with slow ventricular response, CAD, CABG x4, aortic aneurysm s/p repair, CVA, DM, and HFpEF who presented with AKI, gross hematuria, and right pleural effusion. Chest tube was placed 11/14 and removed 11/17. GI and urology were consulted. Patient initially refused endoscopy and colonoscopy. CHADS2-VASc score = 4. GI and Urology are ok with resuming anticoagulation. Apixaban  was resumed 11/20 at 5 BID - last dose on 11/21 10:11 AM. Now, Apixaban  therapy discontinued with a tentative plan for EGD and colonoscopy on Sunday.  Heparin  level elevated as expected due to recent apixaban  use.    Pt has refused the colonoscopy prep on 11/24 - heparin  restarted.   Heparin  restarted ~0430 11/24. Heparin  level 0.35 (likely still affected by apixaban  use), aPTT 60 sec (slightly subtherapeutic) on infusion at 1100 units/hr. D/w RN and pt lost IV access ~1100, levels done at 1130 so not accurate. Awaiting line replacement.  Goal of Therapy:  Heparin  level 0.3-0.7 units/ml aPTT  66-102 seconds Monitor platelets by anticoagulation protocol: Yes   Plan:  Once line replace, restart heparin  infusion at 1100 units/hr Will retime 8h aPTT and heparin  level after line replaced  Vito Ralph, PharmD, BCPS Please see amion for complete clinical pharmacist phone list 11/25/2023 12:54 PM

## 2023-11-25 NOTE — Progress Notes (Cosign Needed)
 Brief GI note:  Patient was scheduled for colonoscopy today but didn't drink any of his prep last night. Patient with intermittent confusion and possible hallucinations. Psychiatry to see. I went by to talk with patient . He is agitated about extended hospital stay. He does not want to reprep for a colonoscopy. He wants to eat now or says is going to a restaurant.   I discussed with RN, she will contact primary team for a diet. No plans for colonoscopy. GI will sign off.  Per GI PA note as above  Norleen SAILOR. Abran Raddle., M.D. [facility] Division of Gastroenterology

## 2023-11-25 NOTE — Progress Notes (Signed)
 Pt. Is alert to self, agitated with hallucination, continue to attempt to get out of bed, pt is not easily reorient

## 2023-11-25 NOTE — Progress Notes (Signed)
 This nurse reached out to after hours on call MD at [facility] due to pt. Refused colon prep and heparin  place on hold for colonoscopy. Waiting on call return

## 2023-11-26 ENCOUNTER — Encounter (HOSPITAL_COMMUNITY): Payer: Self-pay | Admitting: Gastroenterology

## 2023-11-26 ENCOUNTER — Encounter (HOSPITAL_COMMUNITY)
Admission: EM | Disposition: A | Discharge status: Skilled Nursing Facility | Payer: Self-pay | Source: Skilled Nursing Facility | Attending: Family Medicine

## 2023-11-26 DIAGNOSIS — J869 Pyothorax without fistula: Secondary | ICD-10-CM | POA: Diagnosis not present

## 2023-11-26 DIAGNOSIS — R195 Other fecal abnormalities: Secondary | ICD-10-CM | POA: Diagnosis not present

## 2023-11-26 DIAGNOSIS — J90 Pleural effusion, not elsewhere classified: Secondary | ICD-10-CM | POA: Diagnosis not present

## 2023-11-26 DIAGNOSIS — R310 Gross hematuria: Secondary | ICD-10-CM | POA: Diagnosis not present

## 2023-11-26 LAB — GLUCOSE, CAPILLARY: Glucose-Capillary: 198 mg/dL — ABNORMAL HIGH (ref 70–99)

## 2023-11-26 LAB — SURGICAL PATHOLOGY

## 2023-11-26 LAB — HEPARIN LEVEL (UNFRACTIONATED): Heparin Unfractionated: 0.44 [IU]/mL (ref 0.30–0.70)

## 2023-11-26 SURGERY — COLONOSCOPY
Anesthesia: Monitor Anesthesia Care

## 2023-11-26 MED ORDER — QUETIAPINE FUMARATE 25 MG PO TABS
50.0000 mg | ORAL_TABLET | Freq: Every day | ORAL | Status: DC
  Administered 2023-11-26 – 2023-12-14 (×19): 50 mg via ORAL
  Filled 2023-11-26 (×19): qty 2

## 2023-11-26 MED ORDER — HALOPERIDOL LACTATE 5 MG/ML IJ SOLN: 2.0000 mg | Freq: Two times a day (BID) | INTRAMUSCULAR | Status: DC | PRN

## 2023-11-26 MED ORDER — QUETIAPINE FUMARATE 25 MG PO TABS: 12.5000 mg | ORAL_TABLET | Freq: Two times a day (BID) | ORAL | Status: DC | PRN

## 2023-11-26 MED ORDER — HYDRALAZINE HCL 25 MG PO TABS
25.0000 mg | ORAL_TABLET | Freq: Four times a day (QID) | ORAL | Status: DC | PRN
  Administered 2023-11-29 – 2023-12-21 (×7): 25 mg via ORAL
  Filled 2023-11-26 (×7): qty 1

## 2023-11-26 MED ORDER — MIRTAZAPINE 15 MG PO TABS
15.0000 mg | ORAL_TABLET | Freq: Every day | ORAL | Status: DC
  Administered 2023-11-26 – 2023-12-23 (×28): 15 mg via ORAL
  Filled 2023-11-26 (×28): qty 1

## 2023-11-26 MED ORDER — QUETIAPINE FUMARATE 25 MG PO TABS: 50.0000 mg | ORAL_TABLET | Freq: Every day | ORAL | Status: DC

## 2023-11-26 NOTE — Progress Notes (Addendum)
 Pt. Continues to attempt to exit bed, pulling at IV and foley cath soft and clawing at legs, mittenss applied MD notified

## 2023-11-26 NOTE — Progress Notes (Signed)
 PROGRESS NOTE    [name] [name]  FMW:[id number] DOB: [birth year] DOA: 11/12/2023 PCP: [name] HERO, PA-C   Brief Narrative: [name] [name] is a 68 y.o. male with a history of atrial fibrillation with slow ventricular response, CAD, CABG x 4, aortic aneurysm s/p repair, CVA, diabetes mellitus type 2, heart failure with preserved EF. Patient presented secondary to abnormal metabolic panel and while in the emergency department found to have evidence of gross hematuria, anemia and AKI. Urology consulted. Patient started IV fluids. During workup, patient was Hemoccult positive, GI consulted. Hemoglobin continued to drift down requiring 1 unit PRBC via transfusion. CT imaging on admission was significant for right pleural effusion concerning for possible empyema. Pulmonology consulted for chest tube placement which was performed on 11/14 which was removed on 11/17.   Assessment and Plan:  Gross hematuria Possibly related to foley catheter trauma and complicated by Eliquis  use. Urology consulted. CT abdomen/pelvis significant for normal kidneys without hydronephrosis, nonobstructing stone versus vascular calcification of the upper pole left kidney, no renal mass.  Hematuria catheter was declined by patient.  Per urology, possible hemorrhagic cystitis. Hematuria catheter placed and CBI started on 11/14. Urine culture positive for 20,000 colonies/ml of staphylococcus hemolyticus. Patient completed antibiotics. Bleeding improved. Recommendation for finasteride  on discharge.  Possible UTI Urine culture obtained. Patient started empirically on Ceftriaxone . Urinalysis with rare bacteruria, pyuria and is nitrite positive; significant squamous cells. Urine culture positive for 20,000 colonies/ml of staphylococcus hemolyticus. Patient completed Bactrim .  Acute blood loss anemia Chronic iron  deficiency anemia Baseline hemoglobin appears to be around 8-9.  Hemoglobin of 7.5 on admission with  downtrend to 6.7 likely related to patient's gross hematuria in addition to heme positive stool.  1 unit of PRBC ordered on 11/13 with posttransfusion hemoglobin up to 8.5.  Hemoglobin stable.  AKI Present on admission with creatinine of 1.48 and peak of 1.50. Resolved.  Heme positive stool No history of hematochezia or melena.  BUN significantly elevated on admission, but patient also has evidence of dehydration.  Complicated by Eliquis  use. GI consulted and have recommended inpatient EGD/Colonoscopy. Patient initially declined endoscopic procedures, but eventually agreed. Upper endoscopy performed on 11/23 significant for non-ulcer gastritis (biopsied) and multiple non-bleeding duodenal ulcers (biopsied). Plan for colonoscopy deferred secondary to patient refusing bowel prep. GI signed off. -Follow-up biopsies -GI recommendations: Protonix  40 mg BID x6 weeks  Paroxysmal atrial fibrillation with slow ventricular response Patient is not on rate control medication secondary to slow ventricular response.  He is managed on Eliquis  secondary to a CHA2DS2-VASc of 4.  Eliquis  held secondary to hematuria and associated acute anemia.  Right pleural effusion Noted on CT imaging with evidence of moderate-sized collection.  Concern for possible empyema secondary to gas seen on imaging.  Last thoracentesis was from February 2025, which was also right-sided and was secondary to heart failure.  Patient is completely asymptomatic.  Pulmonology consulted and placed a right-sided chest tube on 11/14 and have applied lytics for management of loculated effusion. Chest tube successfully removed on 11/17. Antibiotics completed.  Diabetes mellitus type 2 Uncontrolled with hyperglycemia based on last hemoglobin A1c of 8.3%.  CAD S/p CABG x4.  Aortic atherosclerosis Noted on CT imaging.  Aortic aneurysm Stable.  Chronic HFpEF Currently stable.  Peripheral vascular disease Noted. Not on medication  therapy.  Ambulatory dysfunction Noted. PT/OT recommending SNF, however patient is declining at this time.  Left foot wound Noted.  Generalized anxiety disorder Depression -Continue Paxil  and Remeron   Cognitive  dysfunction Noted. History of severe delirium during prior hospitalization. Patient now with concerns for hallucinations. Sleep appears to be compromised. May benefit from increase to Seroquel  dose. Patient has intermittent confusion. Currently, he lives alone. Long discussion with son on 11/24. Discussed limitations if patient shows evidence of capacity as it relates to his desire/request to be discharged. I discussed that in similar situations, family generally needs to provide assistance to their loved one to improve safety concerns when patient has capacity to make decisions we may disagree with. Son continues to reiterate that we would be discharging unsafely. We discussed caregiver support, however son states they cannot afford this.  -Psychiatry recommendations: pending -Continue Seroquel    DVT prophylaxis: SCDs Code Status:   Code Status: Full Code Family Communication: None at bedside Disposition Plan: Discharge pending improvement in cognitive impairment.    Consultants:  Gastroenterology Urology PCCM  Procedures:  Chest tube placement Upper endoscopy  Antimicrobials: Ceftriaxone     Subjective: No issues this morning. Patient with confusion overnight, per nursing, requiring Ativan  administration.  Objective: BP (!) 157/85 (BP Location: Right Arm)   Pulse (!) 57   Temp 98 F (36.7 C)   Resp 18   Ht 6' 3 (1.905 m)   Wt 68 kg   SpO2 91%   BMI 18.75 kg/m   Examination:  General exam: Appears calm and comfortable. Respiratory system: Clear to auscultation. Respiratory effort normal. Cardiovascular system: S1 & S2 heard, RRR. Gastrointestinal system: Abdomen is nondistended, soft and nontender. Normal bowel sounds heard. Central nervous system:  Alert.   Data Reviewed: I have personally reviewed following labs and imaging studies  CBC Lab Results  Component Value Date   WBC 5.3 11/24/2023   RBC 3.12 (L) 11/24/2023   HGB 8.8 (L) 11/24/2023   HCT 27.3 (L) 11/24/2023   MCV 87.5 11/24/2023   MCH 28.2 11/24/2023   PLT 217 11/24/2023   MCHC 32.2 11/24/2023   RDW 17.1 (H) 11/24/2023   LYMPHSABS 0.8 11/24/2023   MONOABS 0.5 11/24/2023   EOSABS 0.1 11/24/2023   BASOSABS 0.1 11/24/2023     Last metabolic panel Lab Results  Component Value Date   NA 134 (L) 11/25/2023   K 4.2 11/25/2023   CL 101 11/25/2023   CO2 22 11/25/2023   BUN 8 11/25/2023   CREATININE 0.68 11/25/2023   GLUCOSE 126 (H) 11/25/2023   GFRNONAA >60 11/25/2023   GFRAA >60 06/09/2018   CALCIUM  7.9 (L) 11/25/2023   PHOS 3.5 11/24/2023   PROT 6.2 (L) 11/24/2023   ALBUMIN 2.2 (L) 11/24/2023   LABGLOB 3.8 08/26/2023   BILITOT 1.0 11/24/2023   ALKPHOS 132 (H) 11/24/2023   AST 17 11/24/2023   ALT 7 11/24/2023   ANIONGAP 11 11/25/2023    GFR: Estimated Creatinine Clearance: 82.6 mL/min (by C-G formula based on SCr of 0.68 mg/dL).  No results found for this or any previous visit (from the past 240 hours).     Radiology Studies: No results found.     LOS: 13 days    Elgin Lam, MD [facility] 11/26/2023, 12:42 PM   If 7PM-7AM, please contact night-coverage [email or web address]

## 2023-11-26 NOTE — Anesthesia Postprocedure Evaluation (Signed)
 Anesthesia Post Note  Patient: [name] [name]  Procedure(s) Performed: COLONOSCOPY EGD (ESOPHAGOGASTRODUODENOSCOPY)     Patient location during evaluation: Endoscopy Anesthesia Type: MAC Level of consciousness: awake and alert Pain management: pain level controlled Vital Signs Assessment: post-procedure vital signs reviewed and stable Respiratory status: spontaneous breathing, nonlabored ventilation and respiratory function stable Cardiovascular status: stable and blood pressure returned to baseline Postop Assessment: no apparent nausea or vomiting Anesthetic complications: no   No notable events documented.                Zendaya Groseclose

## 2023-11-26 NOTE — Progress Notes (Addendum)
 Soft mittens removed for safety, pt bites mittens

## 2023-11-26 NOTE — Plan of Care (Signed)
  Problem: Coping: Goal: Level of anxiety will decrease Outcome: Progressing   Problem: Elimination: Goal: Will not experience complications related to bowel motility Outcome: Progressing Goal: Will not experience complications related to urinary retention Outcome: Progressing   Problem: Pain Managment: Goal: General experience of comfort will improve and/or be controlled Outcome: Progressing

## 2023-11-26 NOTE — Consult Note (Cosign Needed Addendum)
 [facility] Psychiatric Consult Initial  Patient Name: .[name] [name]  MRN: [id number]  DOB: Jun 26, 1955  Consult Order details:  Orders (From admission, onward)     Start     Ordered   11/25/23 1424  IP CONSULT TO PSYCHIATRY       Ordering Provider: Briana Elgin LABOR, MD  Provider:  (Not yet assigned)  Question Answer Comment  Location [facility]   Reason for Consult? Hallucinations. Telling family he was up walking with children. Talking about [name] being next door. Possibly delirium.      11/25/23 1424   11/21/23 1136  IP CONSULT TO PSYCHIATRY       Ordering Provider: Sherrill Alejandro Donovan, DO  Provider:  (Not yet assigned)  Question Answer Comment  Location [facility]   Reason for Consult? Capacity Evaluation to Decline SNF placement      11/21/23 1136             Mode of Visit: In person    Psychiatry Consult Evaluation  Service Date: November 26, 2023 LOS:  LOS: 13 days  Chief Complaint hallucinations  Primary Psychiatric Diagnoses  Delirium, hyperactive 2.  History of depression anxiety  Assessment  [name] [name] is a 68 y.o. male admitted: Medicallyfor 11/12/2023  9:50 PM for gross hematuria. He carries the psychiatric diagnoses of depression and anxiety, no past psychiatric hospitalizations, no past suicide attempts and has a past medical history of  atrial fibrillation with slow ventricular response, CAD, CABG x 4, aortic aneurysm s/p repair, CVA, diabetes mellitus type 2, heart failure with preserved EF .   Dorn has history of depression anxiety and history of delirium in the hospital.  Currently he has disorientation and impaired attention and insomnia/agitation during the night and auditory/visual hallucinations which is most consistent with hyperactive delirium.  Patient is already on Seroquel  so we will increase this to 50 mg to help calm him during the night.  We will enact delirium precautions.  Given  agitation we will put agitation protocol with p.o. Seroquel  low-dose versus Haldol  low-dose if imminent risk to self or others.  Avoid benzodiazepines.  Optimize medically. Recommend palliative care consult with son is in agreement with once his dad is more lucid.   Diagnoses:  Active Hospital problems: Principal Problem:   Hematuria Active Problems:   Iron  deficiency anemia   Occult blood in stools   Anticoagulated   Pleural effusion on right   Empyema (HCC)   Pleural effusion   Heme positive stool   Duodenal ulcer    Plan   ## Psychiatric Medication Recommendations:  Continue home Paxil  30 mg once daily for MDD Continue home mirtazapine  15 mg once daily for MDD Increase home Seroquel  to 50 mg once daily in setting of acute hyperactive delirium Start agitation protocol twice daily as needed Mild: Seroquel  12.5 mg PO Severe (harm to self or others): Haldol  2 mg IM Avoid benzodiazepines  ## Medical Decision Making Capacity: Not specifically addressed in this encounter  ## Further Work-up:  -- Medical workup for etiologies leading to patient's current delirium -- most recent EKG on 11/11 had QtC of 437 -- Pertinent labwork reviewed earlier this admission includes: BMP unremarkable normal kidney function, CBC hemoglobin 8-9 anemic   ## Disposition:-- There are no psychiatric contraindications to discharge at this time  ## Behavioral / Environmental: -Delirium Precautions: Delirium Interventions for Nursing and Staff: - RN to open blinds every AM. - To Bedside: Glasses, hearing  aide, and pt's own shoes. Make available to patients. when possible and encourage use. - Encourage po fluids when appropriate, keep fluids within reach. - OOB to chair with meals. - Passive ROM exercises to all extremities with AM & PM care. - RN to assess orientation to person, time and place QAM and PRN. - Recommend extended visitation hours with familiar family/friends as feasible. - Staff to minimize  disturbances at night. Turn off television when pt asleep or when not in use.    ## Safety and Observation Level:  - Based on my clinical evaluation, I estimate the patient to be at low risk of self harm in the current setting. - At this time, we recommend TeleSitter if there is concern for patient's safety.  CSSR Risk Category:C-SSRS RISK CATEGORY: No Risk  Suicide Risk Assessment: Patient has following modifiable risk factors for suicide: n/a Patient has following non-modifiable or demographic risk factors for suicide: male gender Patient has the following protective factors against suicide: Supportive family, no history of suicide attempts, and no history of NSSIB  Thank you for this consult request. Recommendations have been communicated to the primary team.  We will sign off at this time.   Justino Cornish, MD       History of Present Illness  Brief Narrative per primary team: [name] [name] is a 68 y.o. male with a history of atrial fibrillation with slow ventricular response, CAD, CABG x 4, aortic aneurysm s/p repair, CVA, diabetes mellitus type 2, heart failure with preserved EF. Patient presented secondary to abnormal metabolic panel and while in the emergency department found to have evidence of gross hematuria, anemia and AKI. Urology consulted. Patient started IV fluids. During workup, patient was Hemoccult positive, GI consulted. Hemoglobin continued to drift down requiring 1 unit PRBC via transfusion. CT imaging on admission was significant for right pleural effusion concerning for possible empyema. Pulmonology consulted for chest tube placement which was performed on 11/14 which was removed on 11/17.  Patient Report:  Patient is unable to participate in meaningful conversation.  Patient is sleeping when entering the room and is easily arousable to physical stimuli but not to verbal stimuli.  Patient is oriented to person and place but not time.  Patient has poor attention  as evident by [facility] testing.  Unable to get further interview.  Per nursing staff patient has been agitated overnight.  Patient required IV Ativan  and nonviolent restraints.  Patient was responding to stimuli and believed he was talking with [name].  Psych ROS:  Unable to assess  Collateral information:  [name], son, [phone]: reports that he was 75% normal last week. Around Friday he got weird. Saying that he was walking (he doesn't), he was hanging out with kids. Then yetserday he was having a conversation with [name] in front of son that was very real. Discussed the scope of palliative care and son was interested in palliative care consult once his father is more lucid.   ROS unable to assess due to delirium  Psychiatric and Social History  Psychiatric History:  Information collected from chart  Depression anxiety, hospitalization in 2021 per previous chart but not able to see this encounter, no past suicide attempts.  Currently is on mirtazapine  15 mg at bedtime and Paxil  30 mg once daily.  Also takes Seroquel  25 at bedtime for sleep?  Social History:  Patient has been experiencing prolonged hospitalizations.  Patient has been in skilled nursing facility for some time.  Son is patient  social support. Access to weapons/lethal means: No  Substance History No substance use recently as patient has been in controlled setting  Exam Findings  Physical Exam:  Vital Signs:  Temp:  [97.7 F (36.5 C)-98 F (36.7 C)] 97.7 F (36.5 C) (11/25 1538) Pulse Rate:  [57-66] 61 (11/25 1538) Resp:  [18] 18 (11/25 1538) BP: (127-182)/(71-88) 182/88 (11/25 1538) SpO2:  [91 %-98 %] 98 % (11/25 1538) Blood pressure (!) 182/88, pulse 61, temperature 97.7 F (36.5 C), resp. rate 18, height 6' 3 (1.905 m), weight 68 kg, SpO2 98%. Body mass index is 18.75 kg/m.   Mental Status Exam: General Appearance: Frail older appearing male laying in bed sideways with his legs over the  guard rails and is sleeping.  He is not awoken to verbal stimulus but is awoken to strong physical stimulus but not painful.  Orientation: Oriented to person and place but not time or situation.  Memory: Unable to assess  Concentration: Poor  Recall: Unable to assess  Attention poor as evident by interaction and unable to participate in formal testing  Eye Contact: Poor  Speech: Difficult to assess but is grossly intact per interaction  Language: Fair  Volume: Decreased in setting of delirium currently  Mood: Unable to assess  Affect: Somnolent  Thought Process: Unable to assess  Thought Content: Unable to assess  Suicidal Thoughts: Unable to assess  Homicidal Thoughts: Unable to assess  Judgement: Poor  Insight:  Lacking  Psychomotor Activity:  Decreased on my encounter  Akathisia:  No  Fund of Knowledge:  Fair      Assets:  Social Support  Cognition:  Impaired,  Moderate per this assessment  ADL's:  Impaired  AIMS (if indicated):        Other History   These have been pulled in through the EMR, reviewed, and updated if appropriate.  Family History:  The patient's family history includes Cancer in his paternal grandmother; Colon cancer in his paternal grandmother; Lymphoma in his father.  Medical History: Past Medical History:  Diagnosis Date   Acute urinary retention 01/04/2022   Altered mental status 08/18/2023   Anxiety    Arthritis    Cataract    CHF (congestive heart failure) (HCC)    Constipation    pt states goes EOD- hard stools    Coronary artery disease    Depression    Diabetes mellitus without complication (HCC)    Diabetic retinopathy (HCC)    DKA (diabetic ketoacidoses)    Hyperlipidemia    Hypertension    off meds   Hypertensive retinopathy    Lesion of ulnar nerve, left upper limb 07/12/2017   Non-intractable vomiting without nausea 06/10/2018   Postherpetic neuralgia 08/11/2021   Speculative diagnosis   Psoriasis    Tubular adenoma of colon  03/2019   UTI (urinary tract infection) 01/05/2022    Surgical History: Past Surgical History:  Procedure Laterality Date   ABDOMINAL AORTOGRAM W/LOWER EXTREMITY N/A 08/30/2020   Procedure: ABDOMINAL AORTOGRAM W/LOWER EXTREMITY;  Surgeon: Serene Gaile ORN, MD;  Location: [facility] INVASIVE CV [facility];  Service: Cardiovascular;  Laterality: N/A;   ABDOMINAL AORTOGRAM W/LOWER EXTREMITY N/A 11/15/2020   Procedure: ABDOMINAL AORTOGRAM W/LOWER EXTREMITY;  Surgeon: Serene Gaile ORN, MD;  Location: [facility];  Service: Cardiovascular;  Laterality: N/A;   ANKLE SURGERY     right   CARDIAC CATHETERIZATION  09/25/2001   COLONOSCOPY     ~10 yr ago- normal  per pt    CORONARY  ARTERY BYPASS GRAFT  09/30/2001   CABG x 4   IR THORACENTESIS ASP PLEURAL SPACE W/IMG GUIDE  05/19/2020   IR THORACENTESIS ASP PLEURAL SPACE W/IMG GUIDE  02/04/2023   LAPAROSCOPIC INGUINAL HERNIA REPAIR Bilateral 02/09/2010   MASS EXCISION Left 03/12/2013   Procedure: LEFT INDEX EXCISION MASS AND DEBRIDMENT DISTAL INTERPHALANGEAL JOINT;  Surgeon: Franky JONELLE Curia, MD;  Location: [facility];  Service: Orthopedics;  Laterality: Left;   PERIPHERAL VASCULAR BALLOON ANGIOPLASTY Left 08/30/2020   Procedure: PERIPHERAL VASCULAR BALLOON ANGIOPLASTY;  Surgeon: Serene Gaile ORN, MD;  Location: [facility];  Service: Cardiovascular;  Laterality: Left;  Peroneal   TRANSESOPHAGEAL ECHOCARDIOGRAM (CATH LAB) N/A 11/26/2022   Procedure: TRANSESOPHAGEAL ECHOCARDIOGRAM;  Surgeon: Kate Lonni CROME, MD;  Location: [facility] INVASIVE CV LAB;  Service: Cardiovascular;  Laterality: N/A;     Medications:   Current Facility-Administered Medications:    acetaminophen  (TYLENOL ) tablet 500 mg, 500 mg, Oral, Q6H PRN, Cirigliano, Vito V, DO, 500 mg at 11/25/23 1635   bisacodyl  (DULCOLAX) suppository 10 mg, 10 mg, Rectal, Daily PRN, Cirigliano, Vito V, DO   Chlorhexidine  Gluconate Cloth 2 % PADS 6 each, 6 each, Topical, Daily, Cirigliano, Vito  V, DO, 6 each at 11/26/23 1217   collagenase  (SANTYL ) ointment 1 Application, 1 Application, Topical, Daily, Cirigliano, Vito V, DO, 1 Application at 11/26/23 1218   finasteride  (PROSCAR ) tablet 5 mg, 5 mg, Oral, Daily, Cirigliano, Vito V, DO, 5 mg at 11/26/23 1217   QUEtiapine  (SEROQUEL ) tablet 12.5 mg, 12.5 mg, Oral, BID PRN **OR** haloperidol  lactate (HALDOL ) injection 2 mg, 2 mg, Intramuscular, BID PRN, Cornelius Dines, MD   heparin  ADULT infusion 100 units/mL (25000 units/250mL), 1,100 Units/hr, Intravenous, Continuous, Hershal Vito MATSU, RPH, Last Rate: 11 mL/hr at 11/26/23 1537, 1,100 Units/hr at 11/26/23 1537   insulin  aspart (novoLOG ) injection 0-5 Units, 0-5 Units, Subcutaneous, QHS, Cirigliano, Vito V, DO, 2 Units at 11/21/23 2207   insulin  aspart (novoLOG ) injection 0-9 Units, 0-9 Units, Subcutaneous, TID WC, Cirigliano, Vito V, DO, 2 Units at 11/26/23 1217   mirtazapine  (REMERON ) tablet 15 mg, 15 mg, Oral, QHS, Christiaan Strebeck, MD   ondansetron  (ZOFRAN ) injection 4 mg, 4 mg, Intravenous, Q6H PRN, Cirigliano, Vito V, DO   PARoxetine  (PAXIL ) tablet 20 mg, 20 mg, Oral, Daily, Cirigliano, Vito V, DO, 20 mg at 11/26/23 1217   [COMPLETED] polyethylene glycol powder (GLYCOLAX /MIRALAX ) container 119 g, 119 g, Oral, Once, 119 g at 11/24/23 2038 **FOLLOWED BY** polyethylene glycol powder (GLYCOLAX /MIRALAX ) container 119 g, 119 g, Oral, Once, Cirigliano, Vito V, DO   QUEtiapine  (SEROQUEL ) tablet 50 mg, 50 mg, Oral, QHS, Lilliona Blakeney, MD   sodium chloride  flush (NS) 0.9 % injection 10 mL, 10 mL, Intrapleural, Q8H, Cirigliano, Vito V, DO, 10 mL at 11/26/23 0923   Continuous Bladder Irrigation, , , Until Discontinued **AND** sodium chloride  irrigation 0.9 % 3,000 mL, 3,000 mL, Irrigation, Continuous, Cirigliano, Vito V, DO, 3,000 mL at 11/16/23 2140   zinc  oxide 20 % ointment 1 Application, 1 Application, Topical, BID PRN, Cirigliano, Vito V, DO  Allergies: Allergies  Allergen Reactions   Other Hives  and Itching    Wool fabric     Dines Cornelius, MD

## 2023-11-26 NOTE — Progress Notes (Signed)
 OT Cancellation Note  Patient Details Name: [name] [name] MRN: [id number] DOB: June 18, 1955   Cancelled Treatment:    Reason Eval/Treat Not Completed: Other (comment). Pt lethargic upon arrival to room. OT attempted bed-level session and attempted to awake Pt.  Pt unable to maintain eyes open for more than a few seconds at a time. Pt ultimately declined session stating he was not wanting to do anything but sleep. OT to follow-up as appropriate and schedule allows.   Maurilio CROME, OTR/LSABRA  [facility] Acute Rehabilitation  Office: [phone]   Maurilio PARAS Milferd Ansell 11/26/2023, 3:15 PM

## 2023-11-26 NOTE — Progress Notes (Signed)
 PHARMACY - ANTICOAGULATION CONSULT NOTE  Pharmacy Consult for IV Heparin  Indication: atrial fibrillation  Allergies  Allergen Reactions   Other Hives and Itching    Wool fabric     Patient Measurements: Height: 6' 3 (190.5 cm) Weight: 68 kg (150 lb) IBW/kg (Calculated) : 84.5 HEPARIN  DW (KG): 68  Vital Signs: Temp: 97.9 F (36.6 C) (11/25 0459) Temp Source: Axillary (11/25 0459) BP: 179/71 (11/25 0459) Pulse Rate: 61 (11/25 0459)  Labs: Recent Labs    11/23/23 1431 11/24/23 0549 11/24/23 1935 11/25/23 0520 11/25/23 1133 11/25/23 2203 11/26/23 0540  HGB 8.6* 8.8*  --   --   --   --   --   HCT 27.3* 27.3*  --   --   --   --   --   PLT 220 217  --   --   --   --   --   APTT 78*  --  64*  --  60* 79*  --   HEPARINUNFRC  --   --   --   --  0.35 0.44 0.44  CREATININE 0.73 0.65  --  0.68  --   --   --     Estimated Creatinine Clearance: 82.6 mL/min (by C-G formula based on SCr of 0.68 mg/dL).   Assessment: 68 years of age male with a history of atrial fibrillation (on Apixaban  pta) with slow ventricular response, CAD, CABG x4, aortic aneurysm s/p repair, CVA, DM, and HFpEF who presented with AKI, gross hematuria, and right pleural effusion. Chest tube was placed 11/14 and removed 11/17. GI and urology were consulted. Patient initially refused endoscopy and colonoscopy. CHADS2-VASc score = 4. GI and Urology are ok with resuming anticoagulation. Apixaban  was resumed 11/20 at 5 BID - last dose on 11/21 10:11 AM. Now, Apixaban  therapy discontinued with a tentative plan for EGD and colonoscopy on Sunday.  Heparin  level elevated as expected due to recent apixaban  use.    Pt has refused the colonoscopy prep on 11/24 - heparin  restarted.   11/25 AM update: HL 0.44 No signs of bleeding / pauses w/ gtt  Goal of Therapy:  Heparin  level 0.3 to 0.7 Monitor platelets by anticoagulation protocol: Yes   Plan:  Continue heparin  infusion at 1100 units/hr Daily HL, CBC F/u  plans  to transition back to DOAC therapy    Benedetta Heath BS, PharmD, BCPS Clinical Pharmacist 11/26/2023 6:55 AM  Contact: [phone] after 3 PM

## 2023-11-27 ENCOUNTER — Ambulatory Visit: Payer: Self-pay | Admitting: Gastroenterology

## 2023-11-27 DIAGNOSIS — R195 Other fecal abnormalities: Secondary | ICD-10-CM | POA: Diagnosis not present

## 2023-11-27 DIAGNOSIS — J90 Pleural effusion, not elsewhere classified: Secondary | ICD-10-CM | POA: Diagnosis not present

## 2023-11-27 DIAGNOSIS — R310 Gross hematuria: Secondary | ICD-10-CM | POA: Diagnosis not present

## 2023-11-27 DIAGNOSIS — J869 Pyothorax without fistula: Secondary | ICD-10-CM | POA: Diagnosis not present

## 2023-11-27 LAB — GLUCOSE, CAPILLARY
Glucose-Capillary: 160 mg/dL — ABNORMAL HIGH (ref 70–99)
Glucose-Capillary: 169 mg/dL — ABNORMAL HIGH (ref 70–99)
Glucose-Capillary: 143 mg/dL — ABNORMAL HIGH (ref 70–99)
Glucose-Capillary: 164 mg/dL — ABNORMAL HIGH (ref 70–99)
Glucose-Capillary: 149 mg/dL — ABNORMAL HIGH (ref 70–99)
Glucose-Capillary: 157 mg/dL — ABNORMAL HIGH (ref 70–99)
Glucose-Capillary: 153 mg/dL — ABNORMAL HIGH (ref 70–99)
Glucose-Capillary: 251 mg/dL — ABNORMAL HIGH (ref 70–99)

## 2023-11-27 LAB — HEPARIN LEVEL (UNFRACTIONATED): Heparin Unfractionated: 0.39 [IU]/mL (ref 0.30–0.70)

## 2023-11-27 MED ORDER — APIXABAN 5 MG PO TABS
5.0000 mg | ORAL_TABLET | Freq: Two times a day (BID) | ORAL | Status: DC
  Administered 2023-11-27 – 2023-12-24 (×55): 5 mg via ORAL
  Filled 2023-11-27 (×55): qty 1

## 2023-11-27 NOTE — Plan of Care (Signed)
   Problem: Activity: Goal: Risk for activity intolerance will decrease Outcome: Progressing   Problem: Safety: Goal: Ability to remain free from injury will improve Outcome: Progressing   Problem: Skin Integrity: Goal: Risk for impaired skin integrity will decrease Outcome: Progressing

## 2023-11-27 NOTE — Progress Notes (Signed)
 PHARMACY - ANTICOAGULATION CONSULT NOTE  Pharmacy Consult for IV Heparin  Indication: atrial fibrillation  Allergies  Allergen Reactions   Other Hives and Itching    Wool fabric     Patient Measurements: Height: 6' 3 (190.5 cm) Weight: 68 kg (150 lb) IBW/kg (Calculated) : 84.5 HEPARIN  DW (KG): 68  Vital Signs: Temp: 98.9 F (37.2 C) (11/26 0409) Temp Source: Oral (11/26 0409) BP: 174/76 (11/26 0409) Pulse Rate: 57 (11/26 0409)  Labs: Recent Labs    11/24/23 1935 11/25/23 0520 11/25/23 1133 11/25/23 1133 11/25/23 2203 11/26/23 0540 11/27/23 0448  APTT 64*  --  60*  --  79*  --   --   HEPARINUNFRC  --   --  0.35   < > 0.44 0.44 0.39  CREATININE  --  0.68  --   --   --   --   --    < > = values in this interval not displayed.    Estimated Creatinine Clearance: 82.6 mL/min (by C-G formula based on SCr of 0.68 mg/dL).   Assessment: 68 years of age male with a history of atrial fibrillation (on Apixaban  pta) with slow ventricular response, CAD, CABG x4, aortic aneurysm s/p repair, CVA, DM, and HFpEF who presented with AKI, gross hematuria, and right pleural effusion. Chest tube was placed 11/14 and removed 11/17. GI and urology were consulted. Patient initially refused endoscopy and colonoscopy. CHADS2-VASc score = 4. GI and Urology are ok with resuming anticoagulation. Apixaban  was resumed 11/20 at 5 BID - last dose on 11/21 10:11 AM. Now, Apixaban  therapy discontinued with a tentative plan for EGD and colonoscopy on Sunday.  Heparin  level elevated as expected due to recent apixaban  use.    Pt has refused the colonoscopy prep on 11/24 - heparin  restarted.   11/26 AM update: HL 0.39 No signs of bleeding / pauses w/ gtt  Goal of Therapy:  Heparin  level 0.3 to 0.7 Monitor platelets by anticoagulation protocol: Yes   Plan:  Continue heparin  infusion at 1100 units/hr Daily HL, CBC F/u  plans to transition back to DOAC therapy    Benedetta Heath BS, PharmD,  BCPS Clinical Pharmacist 11/27/2023 7:02 AM  Contact: [phone] after 3 PM

## 2023-11-27 NOTE — Progress Notes (Signed)
 Occupational Therapy Treatment Patient Details Name: [name] [name] MRN: [id number] DOB: [birth year] Today's Date: 11/27/2023   History of present illness 68 y.o. male admitted 11/12/23 for abnormal renal labs. Found to have AKI and potential UTI. Foley catheter was inserted.  He has since been having hematuria, possibly trauma in the setting of Eliquis . Pt with worsening of chronic normocytic anemia. Chest CT showed pleural effusion. Plan for EGD and colonoscopy. PMHx: PAF, chronic A-Fib, CHF, CAD s/p CABG, PAD, T2DM, HTN, HLD, chronic decubitus ulcer,  chronic anemia with heme positive stool, and chronic diarrhea. Of note, recent hospitalization 10/22-11/5 after fall sustaining L SDH with 4mm shift.   OT comments  Pt is demonstrating slow progress towards OT goals. Focus of session on education and engagement in BUE HEP, engagement in bed level ADL tasks, and reorientation for cognitive engagement. Pt required step-by-step cues for engagement in BUE exercises with theraband as noted below. Pt required set-up A and cues for sequencing for grooming tasks bed level. Pt continues to require assistance for engagement in functional tasks. OT to continue per POC.       If plan is discharge home, recommend the following:  A little help with bathing/dressing/bathroom;A lot of help with walking and/or transfers;Direct supervision/assist for financial management;Direct supervision/assist for medications management;Assistance with cooking/housework;Assist for transportation;Help with stairs or ramp for entrance   Equipment Recommendations  None recommended by OT    Recommendations for Other Services      Precautions / Restrictions Precautions Precautions: Fall Recall of Precautions/Restrictions: Impaired Precaution/Restrictions Comments: low vision Restrictions Weight Bearing Restrictions Per Provider Order: No       Mobility Bed Mobility               General bed mobility comments:  Pt declined mobility this session stating he does not need to get out of bed. Not responsive to OT encouragement to sit in the recliner, use the bathroom, or change gown.    Transfers                   General transfer comment: Pt declined mobility this session.     Balance       Sitting balance - Comments: Pt did not lean forward without support from [facility] that was elevated.                                   ADL either performed or assessed with clinical judgement   ADL Overall ADL's : Needs assistance/impaired     Grooming: Wash/dry face;Set up;Cueing for sequencing;Sitting Grooming Details (indicate cue type and reason): Pt required set-up A for grooming task seated in bed                                    Extremity/Trunk Assessment Upper Extremity Assessment Upper Extremity Assessment: Generalized weakness            Vision   Vision Assessment?: Vision impaired- to be further tested in functional context Additional Comments: Impaired depth perception. Pt with difficulty visually attending to objects. Sensory apron placed in front of Pt and Pt stated he would embarass himself because he cannot see it. Apron held closer to Pt.   Perception     Praxis     Communication Communication Communication: Impaired Factors Affecting Communication: Hearing impaired   Cognition Arousal: Alert  Behavior During Therapy: Flat affect Cognition: Cognition impaired   Orientation impairments: Situation Awareness: Online awareness impaired, Intellectual awareness impaired Memory impairment (select all impairments): Short-term memory, Working civil service fast streamer, Engineer, structural memory Attention impairment (select first level of impairment): Selective attention, Sustained attention Executive functioning impairment (select all impairments): Initiation, Reasoning, Problem solving, Sequencing OT - Cognition Comments: per chart, pt with poor memory with  recall of conversations with medical team from day to day. Additionally, Pt son informed CWS that Pt has experienced delirium in hospital before but not to this extent. Pt AxO today to himself, place, and year. Aware that this was the month of Thanksgiving. Decreased insight and reasoning.                 Following commands: Impaired Following commands impaired: Follows one step commands inconsistently      Cueing   Cueing Techniques: Verbal cues, Visual cues, Gestural cues  Exercises Exercises: General Upper Extremity General Exercises - Upper Extremity Shoulder Flexion: AROM, Both, 10 reps, Seated, Theraband Theraband Level (Shoulder Flexion): Level 1 (Yellow) Shoulder Horizontal ABduction: AROM, Both, 15 reps, Seated, Theraband Theraband Level (Shoulder Horizontal Abduction): Level 1 (Yellow) Elbow Flexion: AROM, Both, 10 reps, Seated, Theraband Theraband Level (Elbow Flexion): Level 1 (Yellow) Digit Composite Flexion: AROM, Both, 5 reps Composite Extension: AROM, Both, 5 reps    Shoulder Instructions       General Comments Pt reoriented throughout session to address delirium. Pt encouraged to engage in mobility and ADL tasks with no response. Sensory apron placed on Pt lap but Pt declined engagement, requesting it be moved.    Pertinent Vitals/ Pain       Pain Assessment Pain Assessment: Faces Faces Pain Scale: Hurts a little bit Pain Location: R knee Pain Descriptors / Indicators: Constant Pain Intervention(s): Limited activity within patient's tolerance, Monitored during session  Home Living                                          Prior Functioning/Environment              Frequency  Min 2X/week        Progress Toward Goals  OT Goals(current goals can now be found in the care plan section)  Progress towards OT goals: Progressing toward goals  Acute Rehab OT Goals Patient Stated Goal: to sleep OT Goal Formulation: With  patient Time For Goal Achievement: 12/05/23 Potential to Achieve Goals: Fair ADL Goals Pt Will Perform Grooming: with modified independence;standing Pt Will Perform Lower Body Dressing: with set-up;sit to/from stand;sitting/lateral leans Pt Will Transfer to Toilet: with supervision;ambulating Pt Will Perform Toileting - Clothing Manipulation and hygiene: with supervision;sit to/from stand;sitting/lateral leans Pt/caregiver will Perform Home Exercise Program: Increased strength;Both right and left upper extremity;With theraband;With Supervision;With written HEP provided Additional ADL Goal #1: Pt will complete pill box test with no more than 3 errors.  Plan      Co-evaluation                 AM-PAC OT 6 Clicks Daily Activity     Outcome Measure   Help from another person eating meals?: A Little Help from another person taking care of personal grooming?: A Little Help from another person toileting, which includes using toliet, bedpan, or urinal?: A Lot Help from another person bathing (including washing, rinsing, drying)?: A Little Help from another person to put  on and taking off regular upper body clothing?: A Little Help from another person to put on and taking off regular lower body clothing?: A Lot 6 Click Score: 16    End of Session    OT Visit Diagnosis: Other abnormalities of gait and mobility (R26.89);Muscle weakness (generalized) (M62.81);Other symptoms and signs involving cognitive function   Activity Tolerance Patient limited by lethargy   Patient Left in bed;with call bell/phone within reach;with bed alarm set   Nurse Communication          Time: [phone] OT Time Calculation (min): 12 min  Charges: OT General Charges $OT Visit: 1 Visit OT Treatments $Therapeutic Activity: 8-22 mins  Maurilio CROME, OTR/L.  [facility]  Office: [phone]   Maurilio PARAS Mohan Erven 11/27/2023, 2:47 PM

## 2023-11-27 NOTE — Progress Notes (Signed)
 PROGRESS NOTE    [name]  FMW:[id number] DOB: 24-Jan-1955 DOA: 11/12/2023 PCP: [name] Mardy HERO, PA-C   Brief Narrative: [name] [name] is a 68 y.o. male with a history of atrial fibrillation with slow ventricular response, CAD, CABG x 4, aortic aneurysm s/p repair, CVA, diabetes mellitus type 2, heart failure with preserved EF. Patient presented secondary to abnormal metabolic panel and while in the emergency department found to have evidence of gross hematuria, anemia and AKI. Urology consulted. Patient started IV fluids. During workup, patient was Hemoccult positive, GI consulted. Hemoglobin continued to drift down requiring 1 unit PRBC via transfusion. CT imaging on admission was significant for right pleural effusion concerning for possible empyema. Pulmonology consulted for chest tube placement which was performed on 11/14 which was removed on 11/17.   Assessment and Plan:  Gross hematuria Possibly related to foley catheter trauma and complicated by Eliquis  use. Urology consulted. CT abdomen/pelvis significant for normal kidneys without hydronephrosis, nonobstructing stone versus vascular calcification of the upper pole left kidney, no renal mass.  Hematuria catheter was declined by patient.  Per urology, possible hemorrhagic cystitis. Hematuria catheter placed and CBI started on 11/14. Urine culture positive for 20,000 colonies/ml of staphylococcus hemolyticus. Patient completed antibiotics. Bleeding improved. Recommendation for finasteride  on discharge.  Possible UTI Urine culture obtained. Patient started empirically on Ceftriaxone . Urinalysis with rare bacteruria, pyuria and is nitrite positive; significant squamous cells. Urine culture positive for 20,000 colonies/ml of staphylococcus hemolyticus. Patient completed Bactrim .  Acute blood loss anemia Chronic iron  deficiency anemia Baseline hemoglobin appears to be around 8-9.  Hemoglobin of 7.5 on admission with  downtrend to 6.7 likely related to patient's gross hematuria in addition to heme positive stool.  1 unit of PRBC ordered on 11/13 with posttransfusion hemoglobin up to 8.5.  Hemoglobin stable.  AKI Present on admission with creatinine of 1.48 and peak of 1.50. Resolved.  Heme positive stool No history of hematochezia or melena.  BUN significantly elevated on admission, but patient also has evidence of dehydration.  Complicated by Eliquis  use. GI consulted and have recommended inpatient EGD/Colonoscopy. Patient initially declined endoscopic procedures, but eventually agreed. Upper endoscopy performed on 11/23 significant for non-ulcer gastritis (biopsied) and multiple non-bleeding duodenal ulcers (biopsied). Plan for colonoscopy deferred secondary to patient refusing bowel prep. GI signed off. -Follow-up biopsies -GI recommendations: Protonix  40 mg BID x6 weeks  Paroxysmal atrial fibrillation with slow ventricular response Patient is not on rate control medication secondary to slow ventricular response.  He is managed on Eliquis  secondary to a CHA2DS2-VASc of 4.  Eliquis  held secondary to hematuria and associated acute anemia. -Eliquis   Right pleural effusion Noted on CT imaging with evidence of moderate-sized collection.  Concern for possible empyema secondary to gas seen on imaging.  Last thoracentesis was from February 2025, which was also right-sided and was secondary to heart failure.  Patient is completely asymptomatic.  Pulmonology consulted and placed a right-sided chest tube on 11/14 and have applied lytics for management of loculated effusion. Chest tube successfully removed on 11/17. Antibiotics completed.  Diabetes mellitus type 2 Uncontrolled with hyperglycemia based on last hemoglobin A1c of 8.3%.  CAD S/p CABG x4.  Aortic atherosclerosis Noted on CT imaging.  Aortic aneurysm Stable.  Chronic HFpEF Currently stable.  Peripheral vascular disease Noted. Not on medication  therapy.  Ambulatory dysfunction Noted. PT/OT recommending SNF, however patient is declining at this time.  Left foot wound Noted.  Generalized anxiety disorder Depression -Continue Paxil  and Remeron   Cognitive dysfunction Noted. History of severe delirium during prior hospitalization. Patient now with concerns for hallucinations. Sleep appears to be compromised. May benefit from increase to Seroquel  dose. Patient has intermittent confusion. Currently, he lives alone. Long discussion with son on 11/24. Discussed limitations if patient shows evidence of capacity as it relates to his desire/request to be discharged. I discussed that in similar situations, family generally needs to provide assistance to their loved one to improve safety concerns when patient has capacity to make decisions we may disagree with. Son continues to reiterate that we would be discharging unsafely. We discussed caregiver support, however son states they cannot afford this.  -Psychiatry recommendations: Seroquel  increase to 50 mg nightly   DVT prophylaxis: SCDs, Eliquis  Code Status:   Code Status: Full Code Family Communication: None at bedside. Son on telephone. Disposition Plan: Discharge pending improvement in cognitive impairment.    Consultants:  Gastroenterology Urology PCCM  Procedures:  Chest tube placement Upper endoscopy  Antimicrobials: Ceftriaxone     Subjective: No concerns this morning.  Objective: BP (!) 164/71 (BP Location: Left Arm)   Pulse (!) 51   Temp (!) 97.4 F (36.3 C)   Resp 16   Ht 6' 3 (1.905 m)   Wt 68 kg   SpO2 98%   BMI 18.75 kg/m   Examination:  General exam: Appears calm and comfortable. Respiratory system: Clear to auscultation. Respiratory effort normal. Cardiovascular system: S1 & S2 heard, RRR. Gastrointestinal system: Abdomen is nondistended, soft and nontender. Normal bowel sounds heard. Central nervous system: Alert and oriented x3. Musculoskeletal:  No edema. No calf tenderness Skin: No cyanosis. No rashes   Data Reviewed: I have personally reviewed following labs and imaging studies  CBC Lab Results  Component Value Date   WBC 5.3 11/24/2023   RBC 3.12 (L) 11/24/2023   HGB 8.8 (L) 11/24/2023   HCT 27.3 (L) 11/24/2023   MCV 87.5 11/24/2023   MCH 28.2 11/24/2023   PLT 217 11/24/2023   MCHC 32.2 11/24/2023   RDW 17.1 (H) 11/24/2023   LYMPHSABS 0.8 11/24/2023   MONOABS 0.5 11/24/2023   EOSABS 0.1 11/24/2023   BASOSABS 0.1 11/24/2023     Last metabolic panel Lab Results  Component Value Date   NA 134 (L) 11/25/2023   K 4.2 11/25/2023   CL 101 11/25/2023   CO2 22 11/25/2023   BUN 8 11/25/2023   CREATININE 0.68 11/25/2023   GLUCOSE 126 (H) 11/25/2023   GFRNONAA >60 11/25/2023   GFRAA >60 06/09/2018   CALCIUM  7.9 (L) 11/25/2023   PHOS 3.5 11/24/2023   PROT 6.2 (L) 11/24/2023   ALBUMIN 2.2 (L) 11/24/2023   LABGLOB 3.8 08/26/2023   BILITOT 1.0 11/24/2023   ALKPHOS 132 (H) 11/24/2023   AST 17 11/24/2023   ALT 7 11/24/2023   ANIONGAP 11 11/25/2023    GFR: Estimated Creatinine Clearance: 82.6 mL/min (by C-G formula based on SCr of 0.68 mg/dL).  No results found for this or any previous visit (from the past 240 hours).     Radiology Studies: No results found.     LOS: 14 days    Elgin Lam, MD [facility] 11/27/2023, 10:59 AM   If 7PM-7AM, please contact night-coverage [email or web address]

## 2023-11-27 NOTE — TOC Progression Note (Signed)
 Transition of Care [facility]) - Progression Note    Patient Details  Name: [name] [name] MRN: [id number] Date of Birth: 1955-03-28  [facility] CM/SW Contact  [name], LCSW Phone Number: 11/27/2023, 1:04 PM  Clinical Narrative:   CSW spoke with pt [name].  Discussed DC options, current delirium.  [name] continues to be concerned that his dad is safe, also verbalizes that he knows the hospital cannot solve the long term care issues with his father either.  [name] did say that pt has had delirium with previous hospitalizations but this time it is worse--Pt was talking with [name] during the last visit as if President Trump was in the room with them, asked [name] to get him something to eat.    ICM will continue to follow.     Expected Discharge Plan: Skilled Nursing Facility Barriers to Discharge: Continued Medical Work up               Expected Discharge Plan and Services In-house Referral: Clinical Social Work   Post Acute Care Choice: Skilled Nursing Facility Living arrangements for the past 2 months: Single Family Home                                       Social Drivers of Health (SDOH) Interventions SDOH Screenings   Food Insecurity: No Food Insecurity (11/13/2023)  Recent Concern: Food Insecurity - Food Insecurity Present (08/18/2023)  Housing: Low Risk  (11/13/2023)  Transportation Needs: No Transportation Needs (11/13/2023)  Utilities: Not At Risk (11/13/2023)  Alcohol Screen: Low Risk  (08/19/2023)  Depression (PHQ2-9): Medium Risk (07/26/2023)  Financial Resource Strain: Low Risk  (08/19/2023)  Physical Activity: Inactive (11/21/2022)  Social Connections: Socially Isolated (11/13/2023)  Stress: Stress Concern Present (11/21/2022)  Tobacco Use: High Risk (11/24/2023)  Health Literacy: Adequate Health Literacy (11/21/2022)    Readmission Risk Interventions    08/20/2023    4:20 PM  Readmission Risk Prevention Plan   Transportation Screening Complete  PCP or Specialist Appt within 3-5 Days Complete  HRI or Home Care Consult Complete  Palliative Care Screening Not Applicable  Medication Review (RN Care Manager) Complete

## 2023-11-28 DIAGNOSIS — R410 Disorientation, unspecified: Secondary | ICD-10-CM | POA: Diagnosis not present

## 2023-11-28 DIAGNOSIS — R195 Other fecal abnormalities: Secondary | ICD-10-CM | POA: Diagnosis not present

## 2023-11-28 DIAGNOSIS — J869 Pyothorax without fistula: Secondary | ICD-10-CM | POA: Diagnosis not present

## 2023-11-28 DIAGNOSIS — Z7189 Other specified counseling: Secondary | ICD-10-CM

## 2023-11-28 DIAGNOSIS — J90 Pleural effusion, not elsewhere classified: Secondary | ICD-10-CM | POA: Diagnosis not present

## 2023-11-28 DIAGNOSIS — Z515 Encounter for palliative care: Secondary | ICD-10-CM | POA: Diagnosis not present

## 2023-11-28 DIAGNOSIS — R310 Gross hematuria: Secondary | ICD-10-CM | POA: Diagnosis not present

## 2023-11-28 LAB — GLUCOSE, CAPILLARY
Glucose-Capillary: 200 mg/dL — ABNORMAL HIGH (ref 70–99)
Glucose-Capillary: 230 mg/dL — ABNORMAL HIGH (ref 70–99)
Glucose-Capillary: 193 mg/dL — ABNORMAL HIGH (ref 70–99)
Glucose-Capillary: 214 mg/dL — ABNORMAL HIGH (ref 70–99)

## 2023-11-28 NOTE — Progress Notes (Signed)
 PROGRESS NOTE    [name] [name]  FMW:[id number] DOB: 02-18-1955 DOA: 11/12/2023 PCP: [name] HERO, PA-C   Brief Narrative: [name] [name] is a 68 y.o. male with a history of atrial fibrillation with slow ventricular response, CAD, CABG x 4, aortic aneurysm s/p repair, CVA, diabetes mellitus type 2, heart failure with preserved EF. Patient presented secondary to abnormal metabolic panel and while in the emergency department found to have evidence of gross hematuria, anemia and AKI. Urology consulted. Patient started IV fluids. During workup, patient was Hemoccult positive, GI consulted. Hemoglobin continued to drift down requiring 1 unit PRBC via transfusion. CT imaging on admission was significant for right pleural effusion concerning for possible empyema. Pulmonology consulted for chest tube placement which was performed on 11/14 which was removed on 11/17.   Assessment and Plan:  Gross hematuria Possibly related to foley catheter trauma and complicated by Eliquis  use. Urology consulted. CT abdomen/pelvis significant for normal kidneys without hydronephrosis, nonobstructing stone versus vascular calcification of the upper pole left kidney, no renal mass.  Hematuria catheter was declined by patient.  Per urology, possible hemorrhagic cystitis. Hematuria catheter placed and CBI started on 11/14. Urine culture positive for 20,000 colonies/ml of staphylococcus hemolyticus. Patient completed antibiotics. Bleeding improved. Recommendation for finasteride  on discharge.  Possible UTI Urine culture obtained. Patient started empirically on Ceftriaxone . Urinalysis with rare bacteruria, pyuria and is nitrite positive; significant squamous cells. Urine culture positive for 20,000 colonies/ml of staphylococcus hemolyticus. Patient completed Bactrim .  Acute blood loss anemia Chronic iron  deficiency anemia Baseline hemoglobin appears to be around 8-9.  Hemoglobin of 7.5 on admission with  downtrend to 6.7 likely related to patient's gross hematuria in addition to heme positive stool.  1 unit of PRBC ordered on 11/13 with posttransfusion hemoglobin up to 8.5.  Hemoglobin stable.  AKI Present on admission with creatinine of 1.48 and peak of 1.50. Resolved.  Heme positive stool No history of hematochezia or melena.  BUN significantly elevated on admission, but patient also has evidence of dehydration.  Complicated by Eliquis  use. GI consulted and have recommended inpatient EGD/Colonoscopy. Patient initially declined endoscopic procedures, but eventually agreed. Upper endoscopy performed on 11/23 significant for non-ulcer gastritis (biopsied) and multiple non-bleeding duodenal ulcers (biopsied). Plan for colonoscopy deferred secondary to patient refusing bowel prep. GI signed off. -Follow-up biopsies -GI recommendations: Protonix  40 mg BID x6 weeks  Paroxysmal atrial fibrillation with slow ventricular response Patient is not on rate control medication secondary to slow ventricular response.  He is managed on Eliquis  secondary to a CHA2DS2-VASc of 4.  Eliquis  held secondary to hematuria and associated acute anemia. -Eliquis   Right pleural effusion Noted on CT imaging with evidence of moderate-sized collection.  Concern for possible empyema secondary to gas seen on imaging.  Last thoracentesis was from February 2025, which was also right-sided and was secondary to heart failure.  Patient is completely asymptomatic.  Pulmonology consulted and placed a right-sided chest tube on 11/14 and have applied lytics for management of loculated effusion. Chest tube successfully removed on 11/17. Antibiotics completed.  Diabetes mellitus type 2 Uncontrolled with hyperglycemia based on last hemoglobin A1c of 8.3%.  CAD S/p CABG x4.  Aortic atherosclerosis Noted on CT imaging.  Aortic aneurysm Stable.  Chronic HFpEF Currently stable.  Peripheral vascular disease Noted. Not on medication  therapy.  Ambulatory dysfunction Noted. PT/OT recommending SNF, however patient is declining at this time.  Left foot wound Noted.  Generalized anxiety disorder Depression -Continue Paxil  and Remeron   Cognitive dysfunction Delirium Noted. History of severe delirium during prior hospitalization. Patient now with concerns for hallucinations. Sleep appears to be compromised. May benefit from increase to Seroquel  dose. Patient has intermittent confusion. Currently, he lives alone. Long discussion with son on 11/24. Discussed limitations if patient shows evidence of capacity as it relates to his desire/request to be discharged. I discussed that in similar situations, family generally needs to provide assistance to their loved one to improve safety concerns when patient has capacity to make decisions we may disagree with. Son continues to reiterate that we would be discharging unsafely. We discussed caregiver support, however son states they cannot afford this. Mental status has improved with correction of sleep-wake cycle. Palliative care consulted per son's request. -Psychiatry recommendations: Seroquel  50 mg nightly -Delirium precautions   DVT prophylaxis: SCDs, Eliquis  Code Status:   Code Status: Full Code Family Communication: None at bedside. Disposition Plan: Discharge pending possible SNF discharge.    Consultants:  Gastroenterology Urology PCCM  Procedures:  Chest tube placement Upper endoscopy  Antimicrobials: Ceftriaxone     Subjective: Patient reports no issues. He states he has not eaten yet. He says he is now agreeable to SNF.  Objective: BP (!) 141/71 (BP Location: Right Arm)   Pulse (!) 107   Temp 98.4 F (36.9 C)   Resp 17   Ht 6' 3 (1.905 m)   Wt 68 kg   SpO2 100%   BMI 18.75 kg/m   Examination:  General exam: Appears calm and comfortable. Respiratory system: Clear to auscultation. Respiratory effort normal. Cardiovascular system: S1 & S2 heard,  RRR. Gastrointestinal system: Abdomen is nondistended, soft and nontender. Normal bowel sounds heard. Central nervous system: Alert and oriented x3. Musculoskeletal: No edema. No calf tenderness   Data Reviewed: I have personally reviewed following labs and imaging studies  CBC Lab Results  Component Value Date   WBC 5.3 11/24/2023   RBC 3.12 (L) 11/24/2023   HGB 8.8 (L) 11/24/2023   HCT 27.3 (L) 11/24/2023   MCV 87.5 11/24/2023   MCH 28.2 11/24/2023   PLT 217 11/24/2023   MCHC 32.2 11/24/2023   RDW 17.1 (H) 11/24/2023   LYMPHSABS 0.8 11/24/2023   MONOABS 0.5 11/24/2023   EOSABS 0.1 11/24/2023   BASOSABS 0.1 11/24/2023     Last metabolic panel Lab Results  Component Value Date   NA 134 (L) 11/25/2023   K 4.2 11/25/2023   CL 101 11/25/2023   CO2 22 11/25/2023   BUN 8 11/25/2023   CREATININE 0.68 11/25/2023   GLUCOSE 126 (H) 11/25/2023   GFRNONAA >60 11/25/2023   GFRAA >60 06/09/2018   CALCIUM  7.9 (L) 11/25/2023   PHOS 3.5 11/24/2023   PROT 6.2 (L) 11/24/2023   ALBUMIN 2.2 (L) 11/24/2023   LABGLOB 3.8 08/26/2023   BILITOT 1.0 11/24/2023   ALKPHOS 132 (H) 11/24/2023   AST 17 11/24/2023   ALT 7 11/24/2023   ANIONGAP 11 11/25/2023    GFR: Estimated Creatinine Clearance: 82.6 mL/min (by C-G formula based on SCr of 0.68 mg/dL).  No results found for this or any previous visit (from the past 240 hours).     Radiology Studies: No results found.     LOS: 15 days    Elgin Lam, MD [facility] 11/28/2023, 11:36 AM   If 7PM-7AM, please contact night-coverage [email or web address]

## 2023-11-28 NOTE — Progress Notes (Addendum)
   11/28/23 1012  Mobility  Activity Dangled on edge of bed  Level of Assistance Other (Comment) (MaxA for Bed Mobility, SV for EOB.)  Assistive Device Other (Comment) (Bedrail)  Activity Response Tolerated fair  Mobility Referral Yes  Mobility visit 1 Mobility  Mobility Specialist Start Time (ACUTE ONLY) 1012  Mobility Specialist Stop Time (ACUTE ONLY) 1032  Mobility Specialist Time Calculation (min) (ACUTE ONLY) 20 min   Mobility Specialist: Progress Note  During Mobility: BP 137/72 (92) Post-Mobility:    BP 166/ 82 (106)  Pt agreeable to mobility session - received in bed. C/o dizziness- RN contacted via Special Educational Needs Teacher. Returned to bed with all needs met - call bell within reach. Bed alarm on.   Additional comments: Pt refused to repositioned higher in bed.   [name] [name], BS Mobility Specialist Please contact via SecureChat or  Rehab office at [phone].

## 2023-11-28 NOTE — Consult Note (Signed)
 Consultation Note Date: 11/28/2023   Patient Name: [name] [name]  DOB: October 02, 1955  MRN: [id number]  Age / Sex: 68 y.o., male  PCP: [name], Mardy HERO, PA-C Referring Physician: Briana Elgin LABOR, MD  Reason for Consultation: Establishing goals of care  HPI/Patient Profile: 68 y.o. male  with past medical history of atrial fibrillation with slow ventricular response, CAD, CABG x 4, aortic aneurysm s/p repair, CVA, diabetes mellitus type 2, and heart failure with preserved EF admitted on 11/12/2023 with abnormal metabolic panel, gross hematuria, anemia, and AKI. CT imaging on admission was significant for right pleural effusion concerning for possible empyema. Pulmonology consulted for chest tube placement which was performed on 11/14 which was removed on 11/17. Hematuria catheter placed and CBI started on 11/14. Urine culture positive for 20,000 colonies/ml of staphylococcus hemolyticus. Patient completed antibiotics. Bleeding improved. Patient has received 1 unit PRBC and hgb now stable. Patient with hx of severe delirium during previous hospitalization. Delerious again this hospitalization. Psych saw 11/20 and deemed patient had capacity to refuse SNF. PMT consulted for GOC and son request.   Clinical Assessment and Goals of Care: I have reviewed medical records including EPIC notes, labs and imaging, assessed the patient and then met with patient to discuss diagnosis prognosis, GOC, EOL wishes, disposition and options.  I introduced Palliative Medicine as specialized medical care for people living with serious illness. It focuses on providing relief from the symptoms and stress of a serious illness. The goal is to improve quality of life for both the patient and the family.  We discussed a brief life review of the patient. Patient is divorced. Has 2 children - son lives locally and daughter lives in [place] . Has a living mother in her 41s.  As far  as functional and nutritional status son tells me of slow decline over the past 5 years but more rapid decline in cognition over the past year. He tells me patient doesn't eat or drink unless prompted. He frequently soils himself. He tells me of delusions at baseline.    We discussed patient's current illness and what it means in the larger context of patient's on-going co-morbidities.  Natural disease trajectory and expectations at EOL were discussed. We discussed improvement in acute issues; however patient demonstrates ongoing decline in function, nutrition, and cognition.   Patient is able to tell me name, place, and date. When asking about the situation he is unable to state why he is on the hospital but when I provide prompts he confirms this is accurate. While attempting to speak to patient regarding his medical care, he frequently tells me that's private when asking about his wishes. Also, his information about his baseline status is inconsistent with what his son reports. When asking about openness to rehospitalization he tells me he would want to be rehosptalized if needed. When discussing code status, he tells me he cannot give me an answer. I explained that default is full code and what this means for him unless he tells us  otherwise. He expresses understanding.   Discussed with patient the importance of continued conversation with family and the medical providers regarding overall plan of care and treatment options, ensuring decisions are within the context of the patient's values and GOCs.    Son would like to try to meet with patient together to further discuss his illness, wishes, and expectations moving forward. We planned to meet at 12:30 11/28.   Questions and concerns were addressed. The family was encouraged to call with  questions or concerns.    Primary Decision Maker Patient - if unable -NEXT OF KIN - son and daughter though from conversation with patient and son it seems  daughter defers decision making to son    SUMMARY OF RECOMMENDATIONS   - ongoing goals of care discussions - planning meeting with son and patient 11/28 at 12:30  Code Status/Advance Care Planning: Full code     Primary Diagnoses: Present on Admission:  Hematuria  Iron  deficiency anemia   I have reviewed the medical record, interviewed the patient and family, and examined the patient. The following aspects are pertinent.  Past Medical History:  Diagnosis Date   Acute urinary retention 01/04/2022   Altered mental status 08/18/2023   Anxiety    Arthritis    Cataract    CHF (congestive heart failure) (HCC)    Constipation    pt states goes EOD- hard stools    Coronary artery disease    Depression    Diabetes mellitus without complication (HCC)    Diabetic retinopathy (HCC)    DKA (diabetic ketoacidoses)    Hyperlipidemia    Hypertension    off meds   Hypertensive retinopathy    Lesion of ulnar nerve, left upper limb 07/12/2017   Non-intractable vomiting without nausea 06/10/2018   Postherpetic neuralgia 08/11/2021   Speculative diagnosis   Psoriasis    Tubular adenoma of colon 03/2019   UTI (urinary tract infection) 01/05/2022   Social History   Socioeconomic History   Marital status: Divorced    Spouse name: Not on file   Number of children: 4   Years of education: 12   Highest education level: 12th grade  Occupational History   Occupation: Retired  Tobacco Use   Smoking status: Some Days    Types: Cigars    Last attempt to quit: 2022    Years since quitting: 3.9   Smokeless tobacco: Never   Tobacco comments:    1-2 per week cigars  05/09/2023  Vaping Use   Vaping status: Never Used  Substance and Sexual Activity   Alcohol use: Not Currently    Comment: about once a year   Drug use: No   Sexual activity: Yes  Other Topics Concern   Not on file  Social History Narrative   Lives alone  son lives 3 doors down from patient    Has 4 children, 2 sons-  both live in [place], 2 daughters one lives in [place], [place]  and youngest daughter attends [employer].    Fiance lives in  [place], [place]   Retired    Social Drivers of Corporate Investment Banker Strain: Low Risk  (08/19/2023)   Overall Financial Resource Strain (CARDIA)    Difficulty of Paying Living Expenses: Not hard at all  Food Insecurity: No Food Insecurity (11/13/2023)   Hunger Vital Sign    Worried About Running Out of Food in the Last Year: Never true    Ran Out of Food in the Last Year: Never true  Recent Concern: Food Insecurity - Food Insecurity Present (08/18/2023)   Hunger Vital Sign    Worried About Running Out of Food in the Last Year: Sometimes true    Ran Out of Food in the Last Year: Sometimes true  Transportation Needs: No Transportation Needs (11/13/2023)   PRAPARE - Administrator, Civil Service (Medical): No    Lack of Transportation (Non-Medical): No  Physical Activity: Inactive (11/21/2022)   Exercise Vital Sign  Days of Exercise per Week: 0 days    Minutes of Exercise per Session: 0 min  Stress: Stress Concern Present (11/21/2022)   [employer] of Occupational Health - Occupational Stress Questionnaire    Feeling of Stress : Very much  Social Connections: Socially Isolated (11/13/2023)   Social Connection and Isolation Panel    Frequency of Communication with Friends and Family: More than three times a week    Frequency of Social Gatherings with Friends and Family: Once a week    Attends Religious Services: Never    Database Administrator or Organizations: No    Attends Engineer, Structural: Never    Marital Status: Divorced   Family History  Problem Relation Age of Onset   Lymphoma Father    Cancer Paternal Grandmother        Colon Cancer   Colon cancer Paternal Grandmother    Colon polyps Neg Hx    Esophageal cancer Neg Hx    Rectal cancer Neg Hx    Stomach cancer Neg Hx    Diabetes Mellitus I  Neg Hx    Pancreatic cancer Neg Hx    Stroke Neg Hx    Dementia Neg Hx    Scheduled Meds:  apixaban   5 mg Oral BID   Chlorhexidine  Gluconate Cloth  6 each Topical Daily   collagenase   1 Application Topical Daily   finasteride   5 mg Oral Daily   insulin  aspart  0-5 Units Subcutaneous QHS   insulin  aspart  0-9 Units Subcutaneous TID WC   mirtazapine   15 mg Oral QHS   PARoxetine   20 mg Oral Daily   polyethylene glycol powder  119 g Oral Once   QUEtiapine   50 mg Oral QHS   sodium chloride  flush  10 mL Intrapleural Q8H   Continuous Infusions:  sodium chloride  irrigation     PRN Meds:.acetaminophen , bisacodyl , QUEtiapine  **OR** haloperidol  lactate, hydrALAZINE , ondansetron  (ZOFRAN ) IV, zinc  oxide Allergies  Allergen Reactions   Other Hives and Itching    Wool fabric    Review of Systems  Constitutional:  Positive for activity change.    Physical Exam Constitutional:      General: He is not in acute distress.    Appearance: He is ill-appearing.  Pulmonary:     Effort: Pulmonary effort is normal.  Skin:    General: Skin is warm and dry.  Neurological:     Mental Status: He is alert and oriented to person, place, and time.     Vital Signs: BP (!) 141/71 (BP Location: Right Arm)   Pulse (!) 107   Temp 98.4 F (36.9 C)   Resp 17   Ht 6' 3 (1.905 m)   Wt 68 kg   SpO2 100%   BMI 18.75 kg/m  Pain Scale: 0-10   Pain Score: 0-No pain   SpO2: SpO2: 100 % O2 Device:SpO2: 100 % O2 Flow Rate: .   IO: Intake/output summary:  Intake/Output Summary (Last 24 hours) at 11/28/2023 1106 Last data filed at 11/27/2023 2018 Gross per 24 hour  Intake --  Output 350 ml  Net -350 ml    LBM: Last BM Date : 11/27/23 Baseline Weight: Weight: 68 kg Most recent weight: Weight: 68 kg     Palliative Assessment/Data: PPS 40%     *Please note that this is a verbal dictation therefore any spelling or grammatical errors are due to the Dragon Medical One system  interpretation.   Time Total: 85 minutes Time  spent includes: Detailed review of medical records (labs, imaging, vital signs), medically appropriate exam, discussion with treatment team, counseling and educating patient, family and/or staff, documenting clinical information, medication management and coordination of care.    Tobey Jama Barnacle, DNP, AGNP-C Palliative Medicine Team [phone] Pager: [phone]

## 2023-11-29 DIAGNOSIS — R627 Adult failure to thrive: Secondary | ICD-10-CM | POA: Diagnosis not present

## 2023-11-29 DIAGNOSIS — Z7189 Other specified counseling: Secondary | ICD-10-CM | POA: Diagnosis not present

## 2023-11-29 DIAGNOSIS — R195 Other fecal abnormalities: Secondary | ICD-10-CM | POA: Diagnosis not present

## 2023-11-29 DIAGNOSIS — J869 Pyothorax without fistula: Secondary | ICD-10-CM | POA: Diagnosis not present

## 2023-11-29 DIAGNOSIS — J90 Pleural effusion, not elsewhere classified: Secondary | ICD-10-CM | POA: Diagnosis not present

## 2023-11-29 DIAGNOSIS — Z515 Encounter for palliative care: Secondary | ICD-10-CM | POA: Diagnosis not present

## 2023-11-29 DIAGNOSIS — R310 Gross hematuria: Secondary | ICD-10-CM | POA: Diagnosis not present

## 2023-11-29 LAB — GLUCOSE, CAPILLARY
Glucose-Capillary: 159 mg/dL — ABNORMAL HIGH (ref 70–99)
Glucose-Capillary: 168 mg/dL — ABNORMAL HIGH (ref 70–99)
Glucose-Capillary: 204 mg/dL — ABNORMAL HIGH (ref 70–99)
Glucose-Capillary: 222 mg/dL — ABNORMAL HIGH (ref 70–99)
Glucose-Capillary: 273 mg/dL — ABNORMAL HIGH (ref 70–99)

## 2023-11-29 NOTE — Progress Notes (Signed)
 PT Cancellation Note  Patient Details Name: [name] [name] MRN: [id number] DOB: 09/26/1955   Cancelled Treatment:    Reason Eval/Treat Not Completed: Patient declined, no reason specified. Pt refusing any mobility with PT this date. Encouragement provided and pt continued to decline, stating he has received bad news and is just not up for it right now. Offered change in position, bathroom; pt declines all offers, however asks for ginger ale and graham crackers. Will continue to follow.    Leita JONETTA Sable 11/29/2023, 1:35 PM  Leita Sable, PT, DPT Acute Rehabilitation Services Secure Chat Preferred Office: [phone]

## 2023-11-29 NOTE — TOC Progression Note (Signed)
 Transition of Care [facility]) - Progression Note    Patient Details  Name: [name] [name] MRN: [id number] Date of Birth: 06-16-1955  [facility] CM/SW Contact  [name], LCSW Phone Number: 11/29/2023, 3:01 PM  Clinical Narrative:   CSW informed by MD that pt is now agreeable to SNF. Per MD, GI issues cited in prior auth denial now stable.   CSW spoke with pt, who does confirm this, states he is willing to go for 2 weeks.  Discussed [name] as SNF facility, pt asks about [facility] but when told [facility] cannot take new admits currently is OK with [name].  1500: SNF.auth request with PTAR submitted to Erika/HTA.      Expected Discharge Plan: Skilled Nursing Facility Barriers to Discharge: Continued Medical Work up               Expected Discharge Plan and Services In-house Referral: Clinical Social Work   Post Acute Care Choice: Skilled Nursing Facility Living arrangements for the past 2 months: Single Family Home                                       Social Drivers of Health (SDOH) Interventions SDOH Screenings   Food Insecurity: No Food Insecurity (11/13/2023)  Recent Concern: Food Insecurity - Food Insecurity Present (08/18/2023)  Housing: Low Risk  (11/13/2023)  Transportation Needs: No Transportation Needs (11/13/2023)  Utilities: Not At Risk (11/13/2023)  Alcohol Screen: Low Risk  (08/19/2023)  Depression (PHQ2-9): Medium Risk (07/26/2023)  Financial Resource Strain: Low Risk  (08/19/2023)  Physical Activity: Inactive (11/21/2022)  Social Connections: Socially Isolated (11/13/2023)  Stress: Stress Concern Present (11/21/2022)  Tobacco Use: High Risk (11/24/2023)  Health Literacy: Adequate Health Literacy (11/21/2022)    Readmission Risk Interventions    08/20/2023    4:20 PM  Readmission Risk Prevention Plan  Transportation Screening Complete  PCP or Specialist Appt within 3-5 Days Complete  HRI or Home  Care Consult Complete  Palliative Care Screening Not Applicable  Medication Review (RN Care Manager) Complete

## 2023-11-29 NOTE — Progress Notes (Addendum)
 Daily Progress Note   Patient Name: [name] [name]       Date: 11/29/2023 DOB: 06/07/1955  Age: 68 y.o. MRN#: [id number] Attending Physician: Briana Elgin LABOR, MD Primary Care Physician: [name], Mardy HERO, PA-C Admit Date: 11/12/2023  Reason for Consultation/Follow-up: Establishing goals of care  Subjective: No complaints  Length of Stay: 16  Current Medications: Scheduled Meds:   apixaban   5 mg Oral BID   Chlorhexidine  Gluconate Cloth  6 each Topical Daily   collagenase   1 Application Topical Daily   finasteride   5 mg Oral Daily   insulin  aspart  0-5 Units Subcutaneous QHS   insulin  aspart  0-9 Units Subcutaneous TID WC   mirtazapine   15 mg Oral QHS   PARoxetine   20 mg Oral Daily   polyethylene glycol powder  119 g Oral Once   QUEtiapine   50 mg Oral QHS   sodium chloride  flush  10 mL Intrapleural Q8H    Continuous Infusions:  sodium chloride  irrigation      PRN Meds: acetaminophen , bisacodyl , QUEtiapine  **OR** haloperidol  lactate, hydrALAZINE , ondansetron  (ZOFRAN ) IV, zinc  oxide  Physical Exam Constitutional:      General: He is not in acute distress.    Appearance: He is ill-appearing.  Pulmonary:     Effort: Pulmonary effort is normal.  Skin:    General: Skin is warm and dry.  Neurological:     Mental Status: He is alert.  Psychiatric:        Thought Content: Thought content is delusional.        Judgment: Judgment is inappropriate.             Vital Signs: BP (!) 164/69 (BP Location: Right Arm)   Pulse (!) 52   Temp 98.6 F (37 C)   Resp 16   Ht 6' 3 (1.905 m)   Wt 68 kg   SpO2 98%   BMI 18.75 kg/m  SpO2: SpO2: 98 % O2 Device: O2 Device: Room Air O2 Flow Rate:    Intake/output summary:  Intake/Output Summary (Last 24 hours) at 11/29/2023 1412 Last  data filed at 11/29/2023 1154 Gross per 24 hour  Intake 730 ml  Output 850 ml  Net -120 ml   LBM: Last BM Date : 11/28/23 Baseline Weight: Weight: 68 kg Most recent weight: Weight: 68 kg       Palliative Assessment/Data: PPS 40%      Patient Active Problem List   Diagnosis Date Noted   Duodenal ulcer 11/24/2023   Heme positive stool 11/22/2023   Pleural effusion on right 11/14/2023   Empyema (HCC) 11/14/2023   Pleural effusion 11/14/2023   Occult blood in stools 11/13/2023   Anticoagulated 11/13/2023   Stage ii Pressure injury sacrum, present on admission 11/01/2023   Hypomagnesemia 10/30/2023   Possible UTI 10/30/2023   Subdural hematoma (HCC) 10/23/2023   AKI (acute kidney injury) 09/12/2023   Uremia 09/12/2023   Diabetes (HCC) 09/12/2023   Acute encephalopathy 09/12/2023   Urinary incontinence 08/30/2023   Hx of iron  deficiency anemia 08/22/2023   Sacral decubitus ulcer 08/18/2023   Bilateral pleural effusion 08/18/2023   GAD (generalized anxiety disorder) 08/18/2023   Chronic diastolic CHF (congestive heart failure) (  HCC) 02/03/2023   History of CVA (cerebrovascular accident) 11/30/2022   Heart valve vegetation 11/23/2022   Noncompliance 11/20/2022   Requires continuous supervision for activities of daily living (ADL) 11/20/2022   Pre-ulcerative calluses 03/05/2022   Protein-calorie malnutrition, severe 01/06/2022   Atrial fibrillation with slow ventricular response (HCC) 01/03/2022   Visual hallucinations 01/03/2022   Hematuria 01/03/2022   Postherpetic neuralgia 08/11/2021   Blood clotting disorder 07/12/2021   Type 1 diabetes mellitus (HCC) 02/16/2021   Chronic diarrhea 02/16/2021   Sinus bradycardia 02/15/2021   Iron  deficiency anemia 07/14/2020   Diabetic ulcer of left foot associated with type 1 diabetes mellitus (HCC) 06/30/2020   Pressure injury of skin 05/24/2020   BPH with obstruction/lower urinary tract symptoms 03/16/2020   Normocytic anemia  01/13/2020   Hyperlipidemia LDL goal <70 01/13/2020   Peripheral artery disease 01/13/2020   Coronary artery disease involving bypass graft of transplanted heart without angina pectoris 01/13/2020   Paroxysmal atrial fibrillation (HCC) 01/13/2020   Recurrent major depressive disorder, in partial remission 01/13/2020   Major depressive disorder, recurrent episode, moderate (HCC) 12/09/2019   Tear of lateral meniscus of knee 10/05/2019   Aortic dissection (HCC) 02/05/2019   Hypertension 02/05/2019   Psoriasis 02/05/2019   Vitamin D  deficiency 01/07/2019   Microalbuminuria 01/07/2019   Patellar tendonitis of right knee 04/24/2018   Peyronie's disease 06/09/2015   Erectile dysfunction 03/15/2015   S/P aortic aneurysm repair 05/19/2012   S/P CABG (coronary artery bypass graft) 05/19/2012   Diabetic retinopathy (HCC) 02/27/2011   Contracture of hand joint 11/01/2010    Palliative Care Assessment & Plan   HPI: 68 y.o. male  with past medical history of atrial fibrillation with slow ventricular response, CAD, CABG x 4, aortic aneurysm s/p repair, CVA, diabetes mellitus type 2, and heart failure with preserved EF admitted on 11/12/2023 with abnormal metabolic panel, gross hematuria, anemia, and AKI. CT imaging on admission was significant for right pleural effusion concerning for possible empyema. Pulmonology consulted for chest tube placement which was performed on 11/14 which was removed on 11/17. Hematuria catheter placed and CBI started on 11/14. Urine culture positive for 20,000 colonies/ml of staphylococcus hemolyticus. Patient completed antibiotics. Bleeding improved. Patient has received 1 unit PRBC and hgb now stable. Patient with hx of severe delirium during previous hospitalization. Delerious again this hospitalization. Psych saw 11/20 and deemed patient had capacity to refuse SNF. PMT consulted for GOC and son request.   Assessment: Follow up meeting today with [name] and his son  per son's request.  Together we reviewed that though [name]'s acute issues have been addressed, his chronic issues remain. We reviewed his decline over the past year in his function, nutrition, and cognition. We reviewed concerns about repeated hospitalizations due to his decline.   [name] does not seem to accept that he has declined, feels he is doing fine. His son attempts to provide more information to him about concerns regarding his health moving forward.  Ultimately, patient does agree to SNF placement - they understand [facility] is working on this and is not clear if will be approved.  During conversation, patient starts talking about 34 people living in his apartment and stealing from him. Son confirms this is not accurate.  With patient and son, we reviewed code status. Discussed full code vs DNR. Patient is emphatic he wants full code, every attempt to prolong his life. He tells me about his faith and how this impacts his decision for full code.  Reviewed conversation with son outside of room. He is grateful for attempts to help Mr Misner understand the situation and his decline. Son is hopeful for dc to SNF as he worries about unsafe situation at home and feels rehab is more in line with patients goals - hopeful to improve, get stronger. Reviewed with son that even w/ dc to SNF patient remains at risk for repeated hospitalizations, he understands this. We review how hospital delirium heightens risk of mortality, he is aware of this as well.   Recommendations/Plan: Ongoing goals of care conversations - patient not very interested in discussing goals at this time - remains full code/full scope - agrees to SNF though family understands this is not guaranteed PMT will be available - son has our number and will reach out if needed, we will follow up periodically, please call with urgent needs  Care plan was discussed with Dr Briana, patient, son, CSW Cathlyn  Thank you for allowing the  Palliative Medicine Team to assist in the care of this patient.   Total Time 65 minutes Prolonged Time Billed  yes  Time spent includes: Detailed review of medical records (labs, imaging, vital signs), medically appropriate exam, discussion with treatment team, counseling and educating patient, family and/or staff, documenting clinical information, medication management and coordination of care.     *Please note that this is a verbal dictation therefore any spelling or grammatical errors are due to the Dragon [employer] One system interpretation.  Tobey Jama Barnacle, DNP, [facility] Palliative Medicine Team Team Phone # [phone]  Pager [phone]

## 2023-11-29 NOTE — Plan of Care (Signed)
   Problem: Education: Goal: Knowledge of General Education information will improve Description Including pain rating scale, medication(s)/side effects and non-pharmacologic comfort measures Outcome: Progressing

## 2023-11-29 NOTE — Progress Notes (Signed)
 PROGRESS NOTE    [name] [name]  FMW:[id number] DOB: February 18, 1955 DOA: 11/12/2023 PCP: [name] HERO, PA-C   Brief Narrative: [name] [name] is a 68 y.o. male with a history of atrial fibrillation with slow ventricular response, CAD, CABG x 4, aortic aneurysm s/p repair, CVA, diabetes mellitus type 2, heart failure with preserved EF. Patient presented secondary to abnormal metabolic panel and while in the emergency department found to have evidence of gross hematuria, anemia and AKI. Urology consulted. Patient started IV fluids. During workup, patient was Hemoccult positive, GI consulted. Hemoglobin continued to drift down requiring 1 unit PRBC via transfusion. CT imaging on admission was significant for right pleural effusion concerning for possible empyema. Pulmonology consulted for chest tube placement which was performed on 11/14 which was removed on 11/17.   Assessment and Plan:  Gross hematuria Possibly related to foley catheter trauma and complicated by Eliquis  use. Urology consulted. CT abdomen/pelvis significant for normal kidneys without hydronephrosis, nonobstructing stone versus vascular calcification of the upper pole left kidney, no renal mass.  Hematuria catheter was declined by patient.  Per urology, possible hemorrhagic cystitis. Hematuria catheter placed and CBI started on 11/14. Urine culture positive for 20,000 colonies/ml of staphylococcus hemolyticus. Patient completed antibiotics. Bleeding improved. Recommendation for finasteride  on discharge.  Possible UTI Urine culture obtained. Patient started empirically on Ceftriaxone . Urinalysis with rare bacteruria, pyuria and is nitrite positive; significant squamous cells. Urine culture positive for 20,000 colonies/ml of staphylococcus hemolyticus. Patient completed Bactrim .  Acute blood loss anemia Chronic iron  deficiency anemia Baseline hemoglobin appears to be around 8-9.  Hemoglobin of 7.5 on admission with  downtrend to 6.7 likely related to patient's gross hematuria in addition to heme positive stool.  1 unit of PRBC ordered on 11/13 with posttransfusion hemoglobin up to 8.5.  Hemoglobin stable.  AKI Present on admission with creatinine of 1.48 and peak of 1.50. Resolved.  Heme positive stool No history of hematochezia or melena.  BUN significantly elevated on admission, but patient also has evidence of dehydration.  Complicated by Eliquis  use. GI consulted and have recommended inpatient EGD/Colonoscopy. Patient initially declined endoscopic procedures, but eventually agreed. Upper endoscopy performed on 11/23 significant for non-ulcer gastritis (biopsied) and multiple non-bleeding duodenal ulcers (biopsied). Plan for colonoscopy deferred secondary to patient refusing bowel prep. GI signed off. Patient continues to have no melena or hematochezia even with anticoagulation restarted. Hemoglobin has been stable.  Gastric ulcer Duodenal ulcers Noted on upper endoscopy. Pathology consistent with chronic peptic duodenitis (no dysplasia or malignancy) and mild chronic inactive gastritis (no H. Pylori identified, no metaplasia or dyspasia). -GI recommendations: Protonix  40 mg BID x6 weeks  Paroxysmal atrial fibrillation with slow ventricular response Patient is not on rate control medication secondary to slow ventricular response.  He is managed on Eliquis  secondary to a CHA2DS2-VASc of 4.  Eliquis  held secondary to hematuria and associated acute anemia. -Eliquis   Right pleural effusion Noted on CT imaging with evidence of moderate-sized collection.  Concern for possible empyema secondary to gas seen on imaging.  Last thoracentesis was from February 2025, which was also right-sided and was secondary to heart failure.  Patient is completely asymptomatic.  Pulmonology consulted and placed a right-sided chest tube on 11/14 and have applied lytics for management of loculated effusion. Chest tube successfully  removed on 11/17. Antibiotics completed.  Diabetes mellitus type 2 Uncontrolled with hyperglycemia based on last hemoglobin A1c of 8.3%.  CAD S/p CABG x4.  Aortic atherosclerosis Noted on CT imaging.  Aortic aneurysm Stable.  Chronic HFpEF Currently stable.  Peripheral vascular disease Noted. Not on medication therapy.  Ambulatory dysfunction Noted. PT/OT recommending SNF, however patient is declining at this time.  Left foot wound Noted.  Generalized anxiety disorder Depression -Continue Paxil  and Remeron   Cognitive dysfunction Delirium Noted. History of severe delirium during prior hospitalization. Patient now with concerns for hallucinations. Sleep appears to be compromised. May benefit from increase to Seroquel  dose. Patient has intermittent confusion. Currently, he lives alone. Long discussion with son on 11/24. Discussed limitations if patient shows evidence of capacity as it relates to his desire/request to be discharged. I discussed that in similar situations, family generally needs to provide assistance to their loved one to improve safety concerns when patient has capacity to make decisions we may disagree with. Son continues to reiterate that we would be discharging unsafely. We discussed caregiver support, however son states they cannot afford this. Mental status has improved with correction of sleep-wake cycle. Palliative care consulted per son's request. -Psychiatry recommendations: Seroquel  50 mg nightly -Delirium precautions   DVT prophylaxis: SCDs, Eliquis  Code Status:   Code Status: Full Code Family Communication: None at bedside. Disposition Plan: Discharge to SNF pending bed and insurance. Patient is otherwise medically stable.   Consultants:  Gastroenterology Urology PCCM  Procedures:  Chest tube placement Upper endoscopy  Antimicrobials: Ceftriaxone     Subjective: Patient reports no concerns this morning. When discussed plan for SNF,  patient voice understanding and agreement.  Objective: BP (!) 164/69 (BP Location: Right Arm)   Pulse (!) 52   Temp 98.6 F (37 C)   Resp 16   Ht 6' 3 (1.905 m)   Wt 68 kg   SpO2 98%   BMI 18.75 kg/m   Examination:  General exam: Appears calm and comfortable. Respiratory system: Clear to auscultation. Respiratory effort normal. Cardiovascular system: S1 & S2 heard, RRR. Gastrointestinal system: Abdomen is nondistended, soft and nontender. Normal bowel sounds heard. Central nervous system: Alert and oriented x3. Musculoskeletal: No edema. No calf tenderness   Data Reviewed: I have personally reviewed following labs and imaging studies  CBC Lab Results  Component Value Date   WBC 5.3 11/24/2023   RBC 3.12 (L) 11/24/2023   HGB 8.8 (L) 11/24/2023   HCT 27.3 (L) 11/24/2023   MCV 87.5 11/24/2023   MCH 28.2 11/24/2023   PLT 217 11/24/2023   MCHC 32.2 11/24/2023   RDW 17.1 (H) 11/24/2023   LYMPHSABS 0.8 11/24/2023   MONOABS 0.5 11/24/2023   EOSABS 0.1 11/24/2023   BASOSABS 0.1 11/24/2023     Last metabolic panel Lab Results  Component Value Date   NA 134 (L) 11/25/2023   K 4.2 11/25/2023   CL 101 11/25/2023   CO2 22 11/25/2023   BUN 8 11/25/2023   CREATININE 0.68 11/25/2023   GLUCOSE 126 (H) 11/25/2023   GFRNONAA >60 11/25/2023   GFRAA >60 06/09/2018   CALCIUM  7.9 (L) 11/25/2023   PHOS 3.5 11/24/2023   PROT 6.2 (L) 11/24/2023   ALBUMIN 2.2 (L) 11/24/2023   LABGLOB 3.8 08/26/2023   BILITOT 1.0 11/24/2023   ALKPHOS 132 (H) 11/24/2023   AST 17 11/24/2023   ALT 7 11/24/2023   ANIONGAP 11 11/25/2023    GFR: Estimated Creatinine Clearance: 82.6 mL/min (by C-G formula based on SCr of 0.68 mg/dL).  No results found for this or any previous visit (from the past 240 hours).     Radiology Studies: No results found.     LOS:  16 days    Elgin Lam, MD [facility] 11/29/2023, 8:38 AM   If 7PM-7AM, please contact  night-coverage [email or web address]

## 2023-11-30 DIAGNOSIS — R310 Gross hematuria: Secondary | ICD-10-CM | POA: Diagnosis not present

## 2023-11-30 DIAGNOSIS — J869 Pyothorax without fistula: Secondary | ICD-10-CM | POA: Diagnosis not present

## 2023-11-30 DIAGNOSIS — R195 Other fecal abnormalities: Secondary | ICD-10-CM | POA: Diagnosis not present

## 2023-11-30 DIAGNOSIS — J90 Pleural effusion, not elsewhere classified: Secondary | ICD-10-CM | POA: Diagnosis not present

## 2023-11-30 LAB — GLUCOSE, CAPILLARY
Glucose-Capillary: 155 mg/dL — ABNORMAL HIGH (ref 70–99)
Glucose-Capillary: 157 mg/dL — ABNORMAL HIGH (ref 70–99)
Glucose-Capillary: 167 mg/dL — ABNORMAL HIGH (ref 70–99)
Glucose-Capillary: 175 mg/dL — ABNORMAL HIGH (ref 70–99)
Glucose-Capillary: 201 mg/dL — ABNORMAL HIGH (ref 70–99)
Glucose-Capillary: 274 mg/dL — ABNORMAL HIGH (ref 70–99)

## 2023-11-30 NOTE — Progress Notes (Signed)
 PROGRESS NOTE    [name] [name]  FMW:[id number] DOB: 1955-05-10 DOA: 11/12/2023 PCP: [name] HERO, PA-C   Brief Narrative: [name] [name] is a 68 y.o. male with a history of atrial fibrillation with slow ventricular response, CAD, CABG x 4, aortic aneurysm s/p repair, CVA, diabetes mellitus type 2, heart failure with preserved EF. Patient presented secondary to abnormal metabolic panel and while in the emergency department found to have evidence of gross hematuria, anemia and AKI. Urology consulted. Patient started IV fluids. During workup, patient was Hemoccult positive, GI consulted. Hemoglobin continued to drift down requiring 1 unit PRBC via transfusion. CT imaging on admission was significant for right pleural effusion concerning for possible empyema. Pulmonology consulted for chest tube placement which was performed on 11/14 which was removed on 11/17.   Assessment and Plan:  Gross hematuria Possibly related to foley catheter trauma and complicated by Eliquis  use. Urology consulted. CT abdomen/pelvis significant for normal kidneys without hydronephrosis, nonobstructing stone versus vascular calcification of the upper pole left kidney, no renal mass.  Hematuria catheter was declined by patient.  Per urology, possible hemorrhagic cystitis. Hematuria catheter placed and CBI started on 11/14. Urine culture positive for 20,000 colonies/ml of staphylococcus hemolyticus. Patient completed antibiotics. Bleeding improved. Recommendation for finasteride  on discharge.  Possible UTI Urine culture obtained. Patient started empirically on Ceftriaxone . Urinalysis with rare bacteruria, pyuria and is nitrite positive; significant squamous cells. Urine culture positive for 20,000 colonies/ml of staphylococcus hemolyticus. Patient completed Bactrim .  Acute blood loss anemia Chronic iron  deficiency anemia Baseline hemoglobin appears to be around 8-9.  Hemoglobin of 7.5 on admission with  downtrend to 6.7 likely related to patient's gross hematuria in addition to heme positive stool.  1 unit of PRBC ordered on 11/13 with posttransfusion hemoglobin up to 8.5.  Hemoglobin stable.  AKI Present on admission with creatinine of 1.48 and peak of 1.50. Resolved.  Heme positive stool No history of hematochezia or melena.  BUN significantly elevated on admission, but patient also has evidence of dehydration.  Complicated by Eliquis  use. GI consulted and have recommended inpatient EGD/Colonoscopy. Patient initially declined endoscopic procedures, but eventually agreed. Upper endoscopy performed on 11/23 significant for non-ulcer gastritis (biopsied) and multiple non-bleeding duodenal ulcers (biopsied). Plan for colonoscopy deferred secondary to patient refusing bowel prep. GI signed off. Patient continues to have no melena or hematochezia even with anticoagulation restarted. Hemoglobin has been stable.  Gastric ulcer Duodenal ulcers Noted on upper endoscopy. Pathology consistent with chronic peptic duodenitis (no dysplasia or malignancy) and mild chronic inactive gastritis (no H. Pylori identified, no metaplasia or dyspasia). -GI recommendations: Protonix  40 mg BID x6 weeks  Paroxysmal atrial fibrillation with slow ventricular response Patient is not on rate control medication secondary to slow ventricular response.  He is managed on Eliquis  secondary to a CHA2DS2-VASc of 4.  Eliquis  held secondary to hematuria and associated acute anemia. -Eliquis   Right pleural effusion Noted on CT imaging with evidence of moderate-sized collection.  Concern for possible empyema secondary to gas seen on imaging.  Last thoracentesis was from February 2025, which was also right-sided and was secondary to heart failure.  Patient is completely asymptomatic.  Pulmonology consulted and placed a right-sided chest tube on 11/14 and have applied lytics for management of loculated effusion. Chest tube successfully  removed on 11/17. Antibiotics completed.  Diabetes mellitus type 2 Uncontrolled with hyperglycemia based on last hemoglobin A1c of 8.3%.  CAD S/p CABG x4.  Aortic atherosclerosis Noted on CT imaging.  Aortic aneurysm Stable.  Chronic HFpEF Currently stable.  Peripheral vascular disease Noted. Not on medication therapy.  Ambulatory dysfunction Noted. PT/OT recommending SNF, however patient is declining at this time.  Left foot wound Noted.  Generalized anxiety disorder Depression -Continue Paxil  and Remeron   Cognitive dysfunction Delirium Noted. History of severe delirium during prior hospitalization. Patient now with concerns for hallucinations. Sleep appears to be compromised. May benefit from increase to Seroquel  dose. Patient has intermittent confusion. Currently, he lives alone. Long discussion with son on 11/24. Discussed limitations if patient shows evidence of capacity as it relates to his desire/request to be discharged. I discussed that in similar situations, family generally needs to provide assistance to their loved one to improve safety concerns when patient has capacity to make decisions we may disagree with. Son continues to reiterate that we would be discharging unsafely. We discussed caregiver support, however son states they cannot afford this. Mental status has improved with correction of sleep-wake cycle. Palliative care consulted per son's request. -Psychiatry recommendations: Seroquel  50 mg nightly -Delirium precautions   DVT prophylaxis: SCDs, Eliquis  Code Status:   Code Status: Full Code Family Communication: None at bedside. Disposition Plan: Discharge to SNF pending bed and insurance. Patient is otherwise medically stable.   Consultants:  Gastroenterology Urology PCCM  Procedures:  Chest tube placement Upper endoscopy  Antimicrobials: Ceftriaxone     Subjective: No issues noted from overnight events.  Objective: BP (!) 141/76 (BP  Location: Right Arm)   Pulse (!) 54   Temp 98.2 F (36.8 C)   Resp 16   Ht 6' 3 (1.905 m)   Wt 68 kg   SpO2 96%   BMI 18.75 kg/m   Examination:  General exam: Appears calm and comfortable. Respiratory system: Respiratory effort normal.   Data Reviewed: I have personally reviewed following labs and imaging studies  CBC Lab Results  Component Value Date   WBC 5.3 11/24/2023   RBC 3.12 (L) 11/24/2023   HGB 8.8 (L) 11/24/2023   HCT 27.3 (L) 11/24/2023   MCV 87.5 11/24/2023   MCH 28.2 11/24/2023   PLT 217 11/24/2023   MCHC 32.2 11/24/2023   RDW 17.1 (H) 11/24/2023   LYMPHSABS 0.8 11/24/2023   MONOABS 0.5 11/24/2023   EOSABS 0.1 11/24/2023   BASOSABS 0.1 11/24/2023     Last metabolic panel Lab Results  Component Value Date   NA 134 (L) 11/25/2023   K 4.2 11/25/2023   CL 101 11/25/2023   CO2 22 11/25/2023   BUN 8 11/25/2023   CREATININE 0.68 11/25/2023   GLUCOSE 126 (H) 11/25/2023   GFRNONAA >60 11/25/2023   GFRAA >60 06/09/2018   CALCIUM  7.9 (L) 11/25/2023   PHOS 3.5 11/24/2023   PROT 6.2 (L) 11/24/2023   ALBUMIN 2.2 (L) 11/24/2023   LABGLOB 3.8 08/26/2023   BILITOT 1.0 11/24/2023   ALKPHOS 132 (H) 11/24/2023   AST 17 11/24/2023   ALT 7 11/24/2023   ANIONGAP 11 11/25/2023    GFR: Estimated Creatinine Clearance: 82.6 mL/min (by C-G formula based on SCr of 0.68 mg/dL).  No results found for this or any previous visit (from the past 240 hours).     Radiology Studies: No results found.     LOS: 17 days    Elgin Lam, MD [facility] 11/30/2023, 10:19 AM   If 7PM-7AM, please contact night-coverage [email or web address]

## 2023-11-30 NOTE — Plan of Care (Signed)
  Problem: Education: Goal: Knowledge of General Education information will improve Description: Including pain rating scale, medication(s)/side effects and non-pharmacologic comfort measures Outcome: Progressing   Problem: Clinical Measurements: Goal: Ability to maintain clinical measurements within normal limits will improve Outcome: Progressing Goal: Will remain free from infection Outcome: Progressing Goal: Diagnostic test results will improve Outcome: Progressing Goal: Respiratory complications will improve Outcome: Progressing Goal: Cardiovascular complication will be avoided Outcome: Progressing   Problem: Activity: Goal: Risk for activity intolerance will decrease Outcome: Progressing   Problem: Coping: Goal: Level of anxiety will decrease Outcome: Progressing   Problem: Elimination: Goal: Will not experience complications related to bowel motility Outcome: Progressing Goal: Will not experience complications related to urinary retention Outcome: Progressing   Problem: Pain Managment: Goal: General experience of comfort will improve and/or be controlled Outcome: Progressing   Problem: Fluid Volume: Goal: Ability to maintain a balanced intake and output will improve Outcome: Progressing   Problem: Health Behavior/Discharge Planning: Goal: Ability to identify and utilize available resources and services will improve Outcome: Progressing Goal: Ability to manage health-related needs will improve Outcome: Progressing   Problem: Skin Integrity: Goal: Risk for impaired skin integrity will decrease Outcome: Progressing   Problem: Tissue Perfusion: Goal: Adequacy of tissue perfusion will improve Outcome: Progressing

## 2023-11-30 NOTE — TOC Progression Note (Addendum)
 Transition of Care [facility]) - Progression Note    Patient Details  Name: [name] [name] MRN: [id number] Date of Birth: 1955-08-06  Transition of Care [facility]) CM/SW Contact  Iren Whipp A Gabbrielle Mcnicholas, LCSW Phone Number: 11/30/2023, 11:09 AM  Clinical Narrative:     Update 1633 CSW was contacted by Madelin at [facility] Team. Authorization request went Peer 2 Peer with Dr. Janit. Requesting a phone call deadline for tomorrow at 12pm at [phone]. MD notified.   CSW contacted [name] at [facility]. Auth status still pending at [facility], under Med review, pt was denied a couple weeks ago. CSW left contact # to be reached out to when auth status is updated.   CSW will continue to follow.  Expected Discharge Plan: Skilled Nursing Facility Barriers to Discharge: Continued Medical Work up               Expected Discharge Plan and Services In-house Referral: Clinical Social Work   Post Acute Care Choice: Skilled Nursing Facility Living arrangements for the past 2 months: Single Family Home                                       Social Drivers of Health (SDOH) Interventions SDOH Screenings   Food Insecurity: No Food Insecurity (11/13/2023)  Recent Concern: Food Insecurity - Food Insecurity Present (08/18/2023)  Housing: Low Risk  (11/13/2023)  Transportation Needs: No Transportation Needs (11/13/2023)  Utilities: Not At Risk (11/13/2023)  Alcohol Screen: Low Risk  (08/19/2023)  Depression (PHQ2-9): Medium Risk (07/26/2023)  Financial Resource Strain: Low Risk  (08/19/2023)  Physical Activity: Inactive (11/21/2022)  Social Connections: Socially Isolated (11/13/2023)  Stress: Stress Concern Present (11/21/2022)  Tobacco Use: High Risk (11/24/2023)  Health Literacy: Adequate Health Literacy (11/21/2022)    Readmission Risk Interventions    08/20/2023    4:20 PM  Readmission Risk Prevention Plan  Transportation Screening Complete  PCP or Specialist Appt within 3-5 Days Complete  HRI or Home  Care Consult Complete  Palliative Care Screening Not Applicable  Medication Review (RN Care Manager) Complete

## 2023-12-01 DIAGNOSIS — K269 Duodenal ulcer, unspecified as acute or chronic, without hemorrhage or perforation: Secondary | ICD-10-CM | POA: Diagnosis not present

## 2023-12-01 DIAGNOSIS — J869 Pyothorax without fistula: Secondary | ICD-10-CM | POA: Diagnosis not present

## 2023-12-01 DIAGNOSIS — R310 Gross hematuria: Secondary | ICD-10-CM | POA: Diagnosis not present

## 2023-12-01 DIAGNOSIS — R195 Other fecal abnormalities: Secondary | ICD-10-CM | POA: Diagnosis not present

## 2023-12-01 LAB — GLUCOSE, CAPILLARY
Glucose-Capillary: 232 mg/dL — ABNORMAL HIGH (ref 70–99)
Glucose-Capillary: 245 mg/dL — ABNORMAL HIGH (ref 70–99)
Glucose-Capillary: 188 mg/dL — ABNORMAL HIGH (ref 70–99)
Glucose-Capillary: 185 mg/dL — ABNORMAL HIGH (ref 70–99)
Glucose-Capillary: 244 mg/dL — ABNORMAL HIGH (ref 70–99)

## 2023-12-01 NOTE — Plan of Care (Signed)
   Problem: Clinical Measurements: Goal: Ability to maintain clinical measurements within normal limits will improve Outcome: Progressing   Problem: Coping: Goal: Level of anxiety will decrease Outcome: Progressing

## 2023-12-01 NOTE — TOC Progression Note (Addendum)
 Transition of Care [facility]) - Progression Note    Patient Details  Name: [name] [name] MRN: [id number] Date of Birth: 1955/09/28  Transition of Care [facility]) CM/SW Contact  Hillman Attig A Ricki Clack, LCSW Phone Number: 12/01/2023, 3:43 PM  Clinical Narrative:     CSW requested MD to complete P2P for SNF. MD made attempts, unable to coordinate and speak with Dr. Janit. Agreeable for decision to be determined with [facility]. CSW followed up with [name] with [facility] and she said denial was likely, fax number provided for letter.   Per MD, family likely to appeal denial. CSW will follow up when letter is received from Health Team.   CSW will continue to follow.   Expected Discharge Plan: Skilled Nursing Facility Barriers to Discharge: Continued Medical Work up               Expected Discharge Plan and Services In-house Referral: Clinical Social Work   Post Acute Care Choice: Skilled Nursing Facility Living arrangements for the past 2 months: Single Family Home                                       Social Drivers of Health (SDOH) Interventions SDOH Screenings   Food Insecurity: No Food Insecurity (11/13/2023)  Recent Concern: Food Insecurity - Food Insecurity Present (08/18/2023)  Housing: Low Risk  (11/13/2023)  Transportation Needs: No Transportation Needs (11/13/2023)  Utilities: Not At Risk (11/13/2023)  Alcohol Screen: Low Risk  (08/19/2023)  Depression (PHQ2-9): Medium Risk (07/26/2023)  Financial Resource Strain: Low Risk  (08/19/2023)  Physical Activity: Inactive (11/21/2022)  Social Connections: Socially Isolated (11/13/2023)  Stress: Stress Concern Present (11/21/2022)  Tobacco Use: High Risk (11/24/2023)  Health Literacy: Adequate Health Literacy (11/21/2022)    Readmission Risk Interventions    08/20/2023    4:20 PM  Readmission Risk Prevention Plan  Transportation Screening Complete  PCP or Specialist Appt within 3-5 Days Complete  HRI or Home Care Consult Complete   Palliative Care Screening Not Applicable  Medication Review (RN Care Manager) Complete

## 2023-12-01 NOTE — Progress Notes (Addendum)
 PROGRESS NOTE    [name] [name]  FMW:[id number] DOB: 03-05-1955 DOA: 11/12/2023 PCP: [name] HERO, PA-C   Brief Narrative:  [name] [name] is a 67 y.o. male with a history of atrial fibrillation with slow ventricular response, CAD, CABG x 4, aortic aneurysm s/p repair, CVA, diabetes mellitus type 2, heart failure with preserved EF. Patient presented secondary to abnormal metabolic panel and while in the emergency department found to have evidence of gross hematuria, anemia and AKI. Urology consulted. Patient started IV fluids. During workup, patient was Hemoccult positive, GI consulted. Hemoglobin continued to drift down requiring 1 unit PRBC via transfusion. CT imaging on admission was significant for right pleural effusion concerning for possible empyema. Pulmonology consulted for chest tube placement which was performed on 11/14 which was removed on 11/17.  Pulmonary signed off the case on 11/19/2023.  Subsequently underwent workup for his acute blood loss anemia superimposed on chronic hydration deficiency anemia with an upper endoscopy and was found to have gastric and duodenal ulcers.  Has been placed on twice daily PPI for now.  Patient is now agreeable for SNF and medically stable for discharge however insurance has denied the authorization and family is likely to appeal.  Assessment and Plan:  Gross hematuria: Possibly related to foley catheter trauma and complicated by Eliquis  use. Urology consulted. CT abdomen/pelvis significant for normal kidneys without hydronephrosis, nonobstructing stone versus vascular calcification of the upper pole left kidney, no renal mass.  Hematuria catheter was declined by patient.  Per urology, possible hemorrhagic cystitis. Hematuria catheter placed and CBI started on 11/14. Urine culture positive for 20,000 colonies/ml of staphylococcus hemolyticus. Patient completed antibiotics. Bleeding improved. Recommendation for finasteride  on discharge.    Possible UTI: Urine culture obtained. Patient started empirically on Ceftriaxone . Urinalysis with rare bacteruria, pyuria and is nitrite positive; significant squamous cells. Urine culture positive for 20,000 colonies/ml of staphylococcus hemolyticus. Patient completed Bactrim .   Acute Blood Loss Anemia superimposed on Chronic iron  deficiency anemia:  Baseline hemoglobin appears to be around 8-9.  Hemoglobin of 7.5 on admission with downtrend to 6.7 likely related to patient's gross hematuria in addition to heme positive stool.  1 unit of PRBC ordered on 11/13 with posttransfusion hemoglobin up to 8.5.  Hemoglobin/Hct Trend stable:  Recent Labs  Lab 11/18/23 0345 11/19/23 0514 11/20/23 0523 11/21/23 0515 11/22/23 0412 11/23/23 1431 11/24/23 0549  HGB 8.1* 8.2* 8.7* 8.1* 7.8* 8.6* 8.8*  HCT 25.3* 25.9* 27.1* 26.1* 24.7* 27.3* 27.3*  MCV 86.6 86.9 88.0 88.8 87.9 87.5 87.5  -CTM for S/Sx of Bleeding no overt bleeding noted.    AKI: Present on admission with creatinine of 1.48 and peak of 1.50. Resolved. BUN/Cr Trend: Recent Labs  Lab 11/19/23 0514 11/20/23 0523 11/21/23 0515 11/22/23 0412 11/23/23 1431 11/24/23 0549 11/25/23 0520  BUN 11 11 10 12 8 8 8   CREATININE 0.80 0.85 0.82 0.70 0.73 0.65 0.68  -Avoid Nephrotoxic Medications, Contrast Dyes, Hypotension and Dehydration to Ensure Adequate Renal Perfusion and will need to Renally Adjust Meds -Continue to Monitor and Trend Renal Function carefully and repeat CMP in the AM    Heme positive stool: No history of hematochezia or melena.  BUN significantly elevated on admission, but patient also has evidence of dehydration.  Complicated by Apixaban  use. GI consulted and have recommended inpatient EGD/Colonoscopy. Patient initially declined endoscopic procedures, but eventually agreed. Upper endoscopy performed on 11/23 significant for non-ulcer gastritis (biopsied) and multiple non-bleeding duodenal ulcers (biopsied). Plan for colonoscopy  deferred secondary  to patient refusing bowel prep. GI signed off. Patient continues to have no melena or hematochezia even with anticoagulation restarted. Hemoglobin has been stable.   Gastric Ulcer and Duodenal ulcers: Noted on upper endoscopy. Pathology consistent with chronic peptic duodenitis (no dysplasia or malignancy) and mild chronic inactive gastritis (no H. Pylori identified, no metaplasia or dyspasia).GI recommended PPI with Pantoprazole  40 mg BID x6 weeks   Paroxysmal atrial fibrillation with slow ventricular response: Patient is not on rate control medication secondary to slow ventricular response.  He is managed on Apixaban  secondary to a CHA2DS2-VASc of 4.  Apixaban  had been held secondary to hematuria and associated acute anemia. -Apixaban  5 mg po BID resumed 11/26.  Continue to monitor   Right pleural effusion: Noted on CT imaging with evidence of moderate-sized collection.  Concern for possible empyema secondary to gas seen on imaging.  Last thoracentesis was from February 2025, which was also right-sided and was secondary to heart failure.  Patient is completely asymptomatic.  Pulmonology consulted and placed a right-sided chest tube on 11/14 and applied lytics for management of loculated effusion. Chest tube successfully removed on 11/17. Antibiotics completed.  With IV ceftriaxone  and Flagyl .  Pulmonary signed off the case on 11/19/2023 and will need outpatient follow-up.   Diabetes mellitus type 2: Uncontrolled with hyperglycemia based on last hemoglobin A1c of 8.3%. C/w Sensitive Novolog  SSI AC/HS. CTM CBGs per Protocol. CBG Trend: Recent Labs  Lab 11/30/23 1203 11/30/23 1642 11/30/23 2133 12/01/23 0613 12/01/23 0850 12/01/23 1201 12/01/23 1604  GLUCAP 157* 155* 274* 232* 245* 188* 185*    CAD: S/p CABG x4.  Hide resumed aspirin  81 mg p.o. daily previously but was held due to his EGD.  No longer undergoing colonoscopy inpatient.  Will need to discuss with GI when okay to  resume given his ulcers   Aortic Atherosclerosis: Noted on CT imaging.   Aortic aneurysm: Stable.   Chronic HFpEF: Currently stable. Strict I's and O's and Daily Weights. CTM for S/Sx of Volume Overload.    Peripheral Vascular Disease: Noted. Not on medication therapy but will consider restarting ASA and discuss with GI given that he has not been resumed on anticoagulation   Ambulatory dysfunction: Noted. PT/OT recommending SNF and now patient is agreeable.  Received a request for peer to peer for SNF and I called Dr. Janit and rang several times and then it went to his voicemail.  I did not receive a callback.  Apparently [facility] has denied the patient for SNF.  Family is appealing the denial for the patient remains medically stable for discharge   Left Foot Wound: Noted. C/w Wound Care reccs per WOC Nurse. Recent CT Scan showed cutaneous thickening and irregularity corresponded to wound along the lateral and plantar forefoot, overlying the level of the distal fifth metatarsal. No appreciable osteolysis or erosive changes of the underlying fifth metatarsal to suggest acute osteomyelitis, No loculated fluid collection, and Diffusely heterogenous appearance of the bones with decreased osseous mineralization.   Generalized Anxiety Disorder and Depression: C/w Paroxetine  20 mg po daily, Mirtazepine 15 mg po at bedtime; Also on Quetiapine  25 mg po at bedtime    Cognitive Dysfunction Delirium Noted. History of severe delirium during prior hospitalization. Patient now with concerns for hallucinations. Sleep appeared to be compromised. Patient has intermittent confusion. Currently, he lives alone.  Dr. Briana had a long discussion with son on 11/24 and discussed limitations if patient shows evidence of capacity as it relates to his desire/request to be  discharged. He discussed that in similar situations, family generally needs to provide assistance to their loved one to improve safety concerns when patient  has capacity to make decisions we may disagree with. Son continues to reiterate that we would be discharging unsafely. He also discussed caregiver support, however son states they cannot afford this. Mental status has improved with correction of sleep-wake cycle. Palliative care consulted per son's request. -Psychiatry recommendations: Seroquel  50 mg nightly -Delirium precautions -After further goals of care discussion patient would like to pursue SNF rehabilitation however was denied by insurance Dr. Janit so family is going to appeal the denial; last PT note from 11/30 continues to recommend SNF   DVT prophylaxis: Place and maintain sequential compression device Start: 11/20/23 1505 SCDs Start: 11/13/23 0733 apixaban  (ELIQUIS ) tablet 5 mg    Code Status: Full Code Family Communication: No family present @ bedside   Disposition Plan:  Level of care: Med-Surg Status is: Inpatient Remains inpatient appropriate because: Medically stable for discharge however insurance authorization has been denied for SNF and family is likely to appeal.  Patient now wants to go to SNF himself  Consultants:  Psychiatry Palliative care medicine Gastroenterology PCCM/Pulmonary Urology  Procedures:  As delineated as above Had EGD 11/24/2023; colonoscopy was attempted but prep was extremely poor  Antimicrobials:  Anti-infectives (From admission, onward)    Start     Dose/Rate Route Frequency Ordered Stop   11/16/23 1315  sulfamethoxazole -trimethoprim  (BACTRIM  DS) 800-160 MG per tablet 1 tablet        1 tablet Oral Every 12 hours 11/16/23 1202 11/22/23 2203   11/14/23 1630  metroNIDAZOLE  (FLAGYL ) IVPB 500 mg  Status:  Discontinued        500 mg 100 mL/hr over 60 Minutes Intravenous Every 8 hours 11/14/23 1534 11/20/23 1606   11/14/23 0200  cefTRIAXone  (ROCEPHIN ) 1 g in sodium chloride  0.9 % 100 mL IVPB  Status:  Discontinued        1 g 200 mL/hr over 30 Minutes Intravenous Every 24 hours 11/13/23 0821  11/20/23 1606   11/13/23 0215  cefTRIAXone  (ROCEPHIN ) 1 g in sodium chloride  0.9 % 100 mL IVPB        1 g 200 mL/hr over 30 Minutes Intravenous  Once 11/13/23 0208 11/13/23 0411       Subjective: Seen and examined at bedside and is resting in no complaints.  Wanting to still go to SNF.  No nausea or vomiting.  Denies any lightheadedness or dizziness.  Feels okay otherwise.  Objective: Vitals:   11/30/23 2010 12/01/23 0437 12/01/23 0828 12/01/23 1306  BP: (!) 147/72 131/64 139/70 (!) 148/60  Pulse: 63 (!) 58 (!) 105 (!) 51  Resp: 18 16 18 17   Temp: 98.3 F (36.8 C) 97.9 F (36.6 C) 97.6 F (36.4 C) 98.4 F (36.9 C)  TempSrc: Oral Oral Oral Oral  SpO2: 98% 98% 97% 99%  Weight:      Height:        Intake/Output Summary (Last 24 hours) at 12/01/2023 1805 Last data filed at 12/01/2023 1100 Gross per 24 hour  Intake 480 ml  Output 1450 ml  Net -970 ml   Filed Weights   11/12/23 2154  Weight: 68 kg   Examination: Physical Exam:  Constitutional: Chronically ill-appearing Caucasian male in no acute distress appears calm resting Respiratory: Slightly diminished to auscultation bilaterally with some coarse breath sounds, no wheezing, rales, rhonchi or crackles. Normal respiratory effort and patient is not tachypenic. No accessory muscle  use.  Unlabored breathing Cardiovascular: RRR, no murmurs / rubs / gallops. S1 and S2 auscultated.  Mild extremity edema Abdomen: Soft, non-tender, non-distended.. Bowel sounds positive.  GU: Deferred. Musculoskeletal: No clubbing / cyanosis of digits/nails. No joint deformity upper and lower extremities.  Skin: Left foot wound is wrapped and he is in a pressure offloading boot Neurologic: CN 2-12 grossly intact with no focal deficits. Romberg sign cerebellar reflexes not assessed.  Psychiatric: He is awake and alert and appears calm  Data Reviewed: I have personally reviewed following labs and imaging studies  CBC: No results for input(s):  WBC, NEUTROABS, HGB, HCT, MCV, PLT in the last 168 hours. Basic Metabolic Panel: Recent Labs  Lab 11/25/23 0520  NA 134*  K 4.2  CL 101  CO2 22  GLUCOSE 126*  BUN 8  CREATININE 0.68  CALCIUM  7.9*   GFR: Estimated Creatinine Clearance: 82.6 mL/min (by C-G formula based on SCr of 0.68 mg/dL). Liver Function Tests: No results for input(s): AST, ALT, ALKPHOS, BILITOT, PROT, ALBUMIN in the last 168 hours. No results for input(s): LIPASE, AMYLASE in the last 168 hours. No results for input(s): AMMONIA in the last 168 hours. Coagulation Profile: No results for input(s): INR, PROTIME in the last 168 hours. Cardiac Enzymes: No results for input(s): CKTOTAL, CKMB, CKMBINDEX, TROPONINI in the last 168 hours. BNP (last 3 results) Recent Labs    03/01/23 1157 08/18/23 1610  PROBNP 399.0* 3,796.0*   HbA1C: No results for input(s): HGBA1C in the last 72 hours. CBG: Recent Labs  Lab 11/30/23 2133 12/01/23 0613 12/01/23 0850 12/01/23 1201 12/01/23 1604  GLUCAP 274* 232* 245* 188* 185*   Lipid Profile: No results for input(s): CHOL, HDL, LDLCALC, TRIG, CHOLHDL, LDLDIRECT in the last 72 hours. Thyroid  Function Tests: No results for input(s): TSH, T4TOTAL, FREET4, T3FREE, THYROIDAB in the last 72 hours. Anemia Panel: No results for input(s): VITAMINB12, FOLATE, FERRITIN, TIBC, IRON , RETICCTPCT in the last 72 hours. Sepsis Labs: No results for input(s): PROCALCITON, LATICACIDVEN in the last 168 hours.  No results found for this or any previous visit (from the past 240 hours).   Radiology Studies: No results found.  Scheduled Meds:  apixaban   5 mg Oral BID   Chlorhexidine  Gluconate Cloth  6 each Topical Daily   collagenase   1 Application Topical Daily   finasteride   5 mg Oral Daily   insulin  aspart  0-5 Units Subcutaneous QHS   insulin  aspart  0-9 Units Subcutaneous TID WC   mirtazapine   15 mg  Oral QHS   PARoxetine   20 mg Oral Daily   polyethylene glycol powder  119 g Oral Once   QUEtiapine   50 mg Oral QHS   Continuous Infusions:  sodium chloride  irrigation      LOS: 18 days   Alejandro Marker, DO [facility] Available via Epic secure chat 7am-7pm After these hours, please refer to coverage provider listed on [email or web address] 12/01/2023, 6:05 PM

## 2023-12-01 NOTE — Progress Notes (Signed)
 Physical Therapy Treatment Patient Details Name: [name] [name] MRN: [id number] DOB: 12-Dec-1955 Today's Date: 12/01/2023   History of Present Illness 68 y.o. male admitted 11/12/23 for abnormal renal labs. Found to have AKI and potential UTI. Foley catheter was inserted.  He has since been having hematuria, possibly trauma in the setting of Eliquis . Pt with worsening of chronic normocytic anemia. Chest CT showed pleural effusion. Plan for EGD and colonoscopy. PMHx: PAF, chronic A-Fib, CHF, CAD s/p CABG, PAD, T2DM, HTN, HLD, chronic decubitus ulcer,  chronic anemia with heme positive stool, and chronic diarrhea. Of note, recent hospitalization 10/22-11/5 after fall sustaining L SDH with 4mm shift.    PT Comments  Continuing work on functional mobility and activity tolerance;  Initial goal of session was to continue work on progressive amb; Pt agreed to try, and appropriately informed PT and RN that he may not be able to walk very far due to feeling weak; opted to use rollator to give pt the opportunity to sit if needed; cues and up to light mod assist to help pt get up to EOB and square off hips; Stood with light mod assist to rollator, and was able to stand long enough to help clean after having had a BM; once clean, noting onset of fatigue, and pt requested to sit;   Despite being fatigued, pt agreed to work with PT; He was able to follow commands related to functional mobility and to self-monitor for activity tolerance; He responded safely to feeling that he was fatigued; Due to pt current functional status, PLOF, home set up and available assistance at home recommending skilled physical therapy services< 3 hours/day on discharge from acute care hospital setting in order to decrease risk for falls, injury, immobility, skin break down and re-hospitalization.     If plan is discharge home, recommend the following: Assistance with cooking/housework;Assist for transportation;Help with stairs or ramp  for entrance;Supervision due to cognitive status;Direct supervision/assist for medications management;A little help with walking and/or transfers;A lot of help with bathing/dressing/bathroom;Direct supervision/assist for financial management   Can travel by private vehicle     Yes  Equipment Recommendations  Wheelchair cushion (measurements PT);Wheelchair (measurements PT);Hospital bed;BSC/3in1    Recommendations for Other Services       Precautions / Restrictions Precautions Precautions: Fall Recall of Precautions/Restrictions: Impaired Precaution/Restrictions Comments: low vision Restrictions Weight Bearing Restrictions Per Provider Order: No     Mobility  Bed Mobility Overal bed mobility: Needs Assistance Bed Mobility: Sit to Supine, Supine to Sit Rolling: Contact guard assist, Used rails   Supine to sit: Min assist, Mod assist Sit to supine: Contact guard assist   General bed mobility comments: Min assist to help initiate LEs coming off of the bed; cues to use teh bedrail to push up to sit; light mod assist  and use of bed pads to sccot to WOB and square off hips at EOB    Transfers Overall transfer level: Needs assistance Equipment used: Rollator (4 wheels) Transfers: Sit to/from Stand, Bed to chair/wheelchair/BSC Sit to Stand: Mod assist   Step pivot transfers: Mod assist       General transfer comment: Light mod assist to power up  to stand and steady as pt took sidesteps towards [facility], holding rollator    Ambulation/Gait                   Comptroller  Bed    Modified Rankin (Stroke Patients Only)       Balance Overall balance assessment: History of Falls, Needs assistance   Sitting balance-Leahy Scale: Fair     Standing balance support: Bilateral upper extremity supported, During functional activity, Reliant on assistive device for balance Standing balance-Leahy Scale: Poor Standing balance  comment: reliant on rollator and intermittent minA                            Communication Communication Communication: Impaired Factors Affecting Communication: Hearing impaired  Cognition Arousal: Alert Behavior During Therapy: Flat affect   PT - Cognitive impairments: No family/caregiver present to determine baseline                       PT - Cognition Comments: Did not speak much during session, but able to indicate when he was too fatigued and weak to stay standing and request to sit Following commands: Impaired Following commands impaired: Follows one step commands inconsistently    Cueing Cueing Techniques: Verbal cues, Visual cues, Gestural cues  Exercises      General Comments General comments (skin integrity, edema, etc.): Incontinent of bowels earlier, and this PT and nurse Tech assist in cleaning during session      Pertinent Vitals/Pain Pain Assessment Pain Assessment: Faces Faces Pain Scale: Hurts a little bit Pain Location: Buttocks Pain Descriptors / Indicators: Aching, Grimacing, Guarding Pain Intervention(s): Repositioned, Other (comment) (Assisted with hygeine and changed sacral dressing)    Home Living                          Prior Function            PT Goals (current goals can now be found in the care plan section) Acute Rehab PT Goals Patient Stated Goal: to improve his strength and endurance PT Goal Formulation: With patient Time For Goal Achievement: 12/12/23 (Goals set 11/14/2023 continue to be appropriate) Potential to Achieve Goals: Fair Progress towards PT goals: Progressing toward goals (slowly)    Frequency    Min 2X/week      PT Plan      Co-evaluation              AM-PAC PT 6 Clicks Mobility   Outcome Measure  Help needed turning from your back to your side while in a flat bed without using bedrails?: A Little Help needed moving from lying on your back to sitting on the side of a  flat bed without using bedrails?: A Little Help needed moving to and from a bed to a chair (including a wheelchair)?: A Little Help needed standing up from a chair using your arms (e.g., wheelchair or bedside chair)?: A Lot Help needed to walk in hospital room?: Total Help needed climbing 3-5 steps with a railing? : Total 6 Click Score: 13    End of Session Equipment Utilized During Treatment: Gait belt Activity Tolerance: Patient tolerated treatment well Patient left: with bed alarm set Nurse Communication: Mobility status PT Visit Diagnosis: Unsteadiness on feet (R26.81);Other abnormalities of gait and mobility (R26.89);Muscle weakness (generalized) (M62.81);History of falling (Z91.81);Dizziness and giddiness (R42);Difficulty in walking, not elsewhere classified (R26.2)     Time: 8776-8746 PT Time Calculation (min) (ACUTE ONLY): 30 min  Charges:    $Therapeutic Activity: 23-37 mins PT General Charges $$ ACUTE PT VISIT: 1 Visit  Silvano Currier, [place]  Acute Rehabilitation Services Office [phone] Secure Chat welcomed'    Silvano VEAR Currier 12/01/2023, 1:24 PM

## 2023-12-02 DIAGNOSIS — R195 Other fecal abnormalities: Secondary | ICD-10-CM | POA: Diagnosis not present

## 2023-12-02 DIAGNOSIS — J869 Pyothorax without fistula: Secondary | ICD-10-CM | POA: Diagnosis not present

## 2023-12-02 DIAGNOSIS — K269 Duodenal ulcer, unspecified as acute or chronic, without hemorrhage or perforation: Secondary | ICD-10-CM | POA: Diagnosis not present

## 2023-12-02 DIAGNOSIS — R310 Gross hematuria: Secondary | ICD-10-CM | POA: Diagnosis not present

## 2023-12-02 LAB — GLUCOSE, CAPILLARY
Glucose-Capillary: 149 mg/dL — ABNORMAL HIGH (ref 70–99)
Glucose-Capillary: 258 mg/dL — ABNORMAL HIGH (ref 70–99)
Glucose-Capillary: 308 mg/dL — ABNORMAL HIGH (ref 70–99)
Glucose-Capillary: 238 mg/dL — ABNORMAL HIGH (ref 70–99)

## 2023-12-02 NOTE — TOC Progression Note (Signed)
 Transition of Care [facility]) - Progression Note    Patient Details  Name: [name] [name] MRN: [id number] Date of Birth: 05-Aug-1955  Transition of Care [facility]) CM/SW Contact  [name], LCSW Phone Number: 12/02/2023, 3:16 PM  Clinical Narrative:   Message left with son [name] regarding SNF auth denial and appeal phone number.    1215: Message from [name]: he has called and appealed the decision.     Expected Discharge Plan: Skilled Nursing Facility Barriers to Discharge: Continued Medical Work up               Expected Discharge Plan and Services In-house Referral: Clinical Social Work   Post Acute Care Choice: Skilled Nursing Facility Living arrangements for the past 2 months: Single Family Home                                       Social Drivers of Health (SDOH) Interventions SDOH Screenings   Food Insecurity: No Food Insecurity (11/13/2023)  Recent Concern: Food Insecurity - Food Insecurity Present (08/18/2023)  Housing: Low Risk  (11/13/2023)  Transportation Needs: No Transportation Needs (11/13/2023)  Utilities: Not At Risk (11/13/2023)  Alcohol Screen: Low Risk  (08/19/2023)  Depression (PHQ2-9): Medium Risk (07/26/2023)  Financial Resource Strain: Low Risk  (08/19/2023)  Physical Activity: Inactive (11/21/2022)  Social Connections: Socially Isolated (11/13/2023)  Stress: Stress Concern Present (11/21/2022)  Tobacco Use: High Risk (11/24/2023)  Health Literacy: Adequate Health Literacy (11/21/2022)    Readmission Risk Interventions    08/20/2023    4:20 PM  Readmission Risk Prevention Plan  Transportation Screening Complete  PCP or Specialist Appt within 3-5 Days Complete  HRI or Home Care Consult Complete  Palliative Care Screening Not Applicable  Medication Review (RN Care Manager) Complete

## 2023-12-02 NOTE — Progress Notes (Signed)
 PROGRESS NOTE    [name] [name]  FMW:[id number] DOB: 01-25-1955 DOA: 11/12/2023 PCP: [name] HERO, PA-C   Brief Narrative:  [name] [name] is a 68 y.o. male with a history of atrial fibrillation with slow ventricular response, CAD, CABG x 4, aortic aneurysm s/p repair, CVA, diabetes mellitus type 2, heart failure with preserved EF. Patient presented secondary to abnormal metabolic panel and while in the emergency department found to have evidence of gross hematuria, anemia and AKI. Urology consulted. Patient started IV fluids. During workup, patient was Hemoccult positive, GI consulted. Hemoglobin continued to drift down requiring 1 unit PRBC via transfusion. CT imaging on admission was significant for right pleural effusion concerning for possible empyema. Pulmonology consulted for chest tube placement which was performed on 11/14 which was removed on 11/17.  Pulmonary signed off the case on 11/19/2023.  Subsequently underwent workup for his acute blood loss anemia superimposed on chronic hydration deficiency anemia with an upper endoscopy and was found to have gastric and duodenal ulcers.  Has been placed on twice daily PPI for now.  Patient is now agreeable for SNF and medically stable for discharge however insurance has denied the authorization and family is appealing the Denial decision.   Assessment and Plan:  Gross Hematuria: Possibly related to foley catheter trauma and complicated by Eliquis  use. Urology consulted. CT abdomen/pelvis significant for normal kidneys without hydronephrosis, nonobstructing stone versus vascular calcification of the upper pole left kidney, no renal mass.  Hematuria catheter was declined by patient.  Per urology, possible hemorrhagic cystitis. Hematuria catheter placed and CBI started on 11/14. Urine culture positive for 20,000 colonies/ml of staphylococcus hemolyticus. Patient completed antibiotics. Bleeding improved. Recommendation for finasteride  on  discharge.   Possible UTI: Urine culture obtained. Patient started empirically on Ceftriaxone . Urinalysis with rare bacteruria, pyuria and is nitrite positive; significant squamous cells. Urine culture positive for 20,000 colonies/ml of staphylococcus hemolyticus. Patient completed Bactrim .   Acute Blood Loss Anemia superimposed on Chronic iron  deficiency anemia:  Baseline hemoglobin appears to be around 8-9.  Hemoglobin of 7.5 on admission with downtrend to 6.7 likely related to patient's gross hematuria in addition to heme positive stool.  1 unit of PRBC ordered on 11/13 with posttransfusion hemoglobin up to 8.5.  Hemoglobin/Hct Trend stable:  Recent Labs  Lab 11/18/23 0345 11/19/23 0514 11/20/23 0523 11/21/23 0515 11/22/23 0412 11/23/23 1431 11/24/23 0549  HGB 8.1* 8.2* 8.7* 8.1* 7.8* 8.6* 8.8*  HCT 25.3* 25.9* 27.1* 26.1* 24.7* 27.3* 27.3*  MCV 86.6 86.9 88.0 88.8 87.9 87.5 87.5  -CTM for S/Sx of Bleeding no overt bleeding noted.    AKI: Present on admission with creatinine of 1.48 and peak of 1.50. Resolved. BUN/Cr Trend: Recent Labs  Lab 11/19/23 0514 11/20/23 0523 11/21/23 0515 11/22/23 0412 11/23/23 1431 11/24/23 0549 11/25/23 0520  BUN 11 11 10 12 8 8 8   CREATININE 0.80 0.85 0.82 0.70 0.73 0.65 0.68  -Avoid Nephrotoxic Medications, Contrast Dyes, Hypotension and Dehydration to Ensure Adequate Renal Perfusion and will need to Renally Adjust Meds -Continue to Monitor and Trend Renal Function carefully and repeat CMP in the AM    Heme positive stool: No history of hematochezia or melena.  BUN significantly elevated on admission, but patient also has evidence of dehydration.  Complicated by Apixaban  use. GI consulted and have recommended inpatient EGD/Colonoscopy. Patient initially declined endoscopic procedures, but eventually agreed. Upper endoscopy performed on 11/23 significant for non-ulcer gastritis (biopsied) and multiple non-bleeding duodenal ulcers (biopsied). Plan  for colonoscopy  deferred secondary to patient refusing bowel prep. GI signed off. Patient continues to have no melena or hematochezia even with anticoagulation restarted. Hemoglobin has been stable.   Gastric Ulcer and Duodenal ulcers: Noted on upper endoscopy. Pathology consistent with chronic peptic duodenitis (no dysplasia or malignancy) and mild chronic inactive gastritis (no H. Pylori identified, no metaplasia or dyspasia).GI recommended PPI with Pantoprazole  40 mg BID x6 weeks   Paroxysmal atrial fibrillation with slow ventricular response: Patient is not on rate control medication secondary to slow ventricular response.  He is managed on Apixaban  secondary to a CHA2DS2-VASc of 4.  Apixaban  had been held secondary to hematuria and associated acute anemia. -Apixaban  5 mg po BID resumed 11/26.  Continue to monitor   Right pleural effusion: Noted on CT imaging with evidence of moderate-sized collection.  Concern for possible empyema secondary to gas seen on imaging.  Last thoracentesis was from February 2025, which was also right-sided and was secondary to heart failure.  Patient is completely asymptomatic.  Pulmonology consulted and placed a right-sided chest tube on 11/14 and applied lytics for management of loculated effusion. Chest tube successfully removed on 11/17. Antibiotics completed.  With IV ceftriaxone  and Flagyl .  Pulmonary signed off the case on 11/19/2023 and will need outpatient follow-up.   Diabetes mellitus type 2: Uncontrolled with hyperglycemia based on last hemoglobin A1c of 8.3%. C/w Sensitive Novolog  SSI AC/HS. CTM CBGs per Protocol. CBG Trend: Recent Labs  Lab 12/01/23 [phone] 12/01/23 0850 12/01/23 1201 12/01/23 1604 12/01/23 2110 12/02/23 0635 12/02/23 1129  GLUCAP 232* 245* 188* 185* 244* 149* 258*    CAD: S/p CABG x4. He had been resumed aspirin  81 mg p.o. daily previously but was held due to his EGD.  No longer undergoing colonoscopy inpatient.  Will need to discuss  with GI when okay to resume given his ulcers   Aortic Atherosclerosis: Noted on CT imaging.   Aortic aneurysm: Stable.   Chronic HFpEF: Currently stable. Strict I's and O's and Daily Weights. CTM for S/Sx of Volume Overload.    Peripheral Vascular Disease: Noted. Not on medication therapy but will consider restarting ASA and discuss with GI given that he has not been resumed on anticoagulation   Ambulatory Dysfunction: Noted. PT/OT recommending SNF and now patient is agreeable.  Received a request for peer to peer for SNF and I called Dr. Janit and rang several times and then it went to his voicemail.  I did not receive a callback.  Apparently [employer] has denied the patient for SNF.  Family (Son)  is appealing the denial for the patient and awaiting appeal process and decision; Remains medically stable for discharge   Left Foot Wound: Noted. C/w Wound Care reccs per WOC Nurse. Recent CT Scan showed cutaneous thickening and irregularity corresponded to wound along the lateral and plantar forefoot, overlying the level of the distal fifth metatarsal. No appreciable osteolysis or erosive changes of the underlying fifth metatarsal to suggest acute osteomyelitis, No loculated fluid collection, and Diffusely heterogenous appearance of the bones with decreased osseous mineralization. C/w WOC Care Recc's   Generalized Anxiety Disorder and Depression: C/w Paroxetine  20 mg po daily, Mirtazepine 15 mg po at bedtime; Also on Quetiapine  25 mg po at bedtime    Cognitive Dysfunction Delirium Noted. History of severe delirium during prior hospitalization. Patient now with concerns for hallucinations. Sleep appeared to be compromised. Patient has intermittent confusion. Currently, he lives alone.  Dr. Briana had a long discussion with son on 11/24 and discussed  limitations if patient shows evidence of capacity as it relates to his desire/request to be discharged. He discussed that in similar situations, family generally  needs to provide assistance to their loved one to improve safety concerns when patient has capacity to make decisions we may disagree with. Son continues to reiterate that we would be discharging unsafely. He also discussed caregiver support, however son states they cannot afford this. Mental status has improved with correction of sleep-wake cycle. Palliative care consulted per son's request. -Psychiatry recommendations: Seroquel  50 mg nightly -Delirium precautions -After further goals of care discussion patient would like to pursue SNF rehabilitation however was denied by insurance Dr. Janit so family is going to appeal the denial; last PT note from 11/30 continues to recommend SNF -After discussion with TOC the patient's son is appealing the Denial again    DVT prophylaxis: Place and maintain sequential compression device Start: 11/20/23 1505 SCDs Start: 11/13/23 0733 apixaban  (ELIQUIS ) tablet 5 mg    Code Status: Full Code Family Communication: No family present @ bedside but son has been in contact with TOC  Disposition Plan:  Level of care: Med-Surg Status is: Inpatient Remains inpatient appropriate because: Likely stable for discharge however his insurance authorization for significant been denied and now family is appealing   Consultants:  Psychiatry Palliative care medicine Gastroenterology PCCM/Pulmonary Urology  Procedures:  As delineated as above Had EGD 11/24/2023; colonoscopy was attempted but prep was extremely poor  Antimicrobials:  Anti-infectives (From admission, onward)    Start     Dose/Rate Route Frequency Ordered Stop   11/16/23 1315  sulfamethoxazole -trimethoprim  (BACTRIM  DS) 800-160 MG per tablet 1 tablet        1 tablet Oral Every 12 hours 11/16/23 1202 11/22/23 2203   11/14/23 1630  metroNIDAZOLE  (FLAGYL ) IVPB 500 mg  Status:  Discontinued        500 mg 100 mL/hr over 60 Minutes Intravenous Every 8 hours 11/14/23 1534 11/20/23 1606   11/14/23 0200   cefTRIAXone  (ROCEPHIN ) 1 g in sodium chloride  0.9 % 100 mL IVPB  Status:  Discontinued        1 g 200 mL/hr over 30 Minutes Intravenous Every 24 hours 11/13/23 0821 11/20/23 1606   11/13/23 0215  cefTRIAXone  (ROCEPHIN ) 1 g in sodium chloride  0.9 % 100 mL IVPB        1 g 200 mL/hr over 30 Minutes Intravenous  Once 11/13/23 0208 11/13/23 0411       Subjective: Seen and examined at bedside and is resting and had no complaints.  Denied lightheadedness or dizziness.  No nausea or vomiting.  No other concerns or complaints at this time.  Objective: Vitals:   12/01/23 1306 12/01/23 2032 12/02/23 0420 12/02/23 0801  BP: (!) 148/60 (!) 150/64 124/61 (!) 121/57  Pulse: (!) 51 (!) 51 (!) 47 (!) 53  Resp: 17  20 16   Temp: 98.4 F (36.9 C) 97.8 F (36.6 C) 98.2 F (36.8 C) 98.3 F (36.8 C)  TempSrc: Oral Oral  Oral  SpO2: 99% 100% 97% 100%  Weight:      Height:        Intake/Output Summary (Last 24 hours) at 12/02/2023 1440 Last data filed at 12/02/2023 0800 Gross per 24 hour  Intake 477 ml  Output 650 ml  Net -173 ml   Filed Weights   11/12/23 2154  Weight: 68 kg   Examination: Physical Exam:  Constitutional: Chronically ill-appearing Caucasian male in no acute distress appears calm and resting Respiratory:  Diminished to auscultation bilaterally, no wheezing, rales, rhonchi or crackles. Normal respiratory effort and patient is not tachypenic. No accessory muscle use.  Unlabored breathing Cardiovascular: RRR, no murmurs / rubs / gallops. S1 and S2 auscultated.  Slight extremity edema Abdomen: Soft, non-tender, non-distended. Bowel sounds positive.  Neurologic: CN 2-12 grossly intact with no focal deficits. Romberg sign and cerebellar reflexes not assessed.  Psychiatric: Is awake and alert and appears calm  Data Reviewed: I have personally reviewed following labs and imaging studies  CBC: No results for input(s): WBC, NEUTROABS, HGB, HCT, MCV, PLT in the last 168  hours. Basic Metabolic Panel: No results for input(s): NA, K, CL, CO2, GLUCOSE, BUN, CREATININE, CALCIUM , MG, PHOS in the last 168 hours. GFR: Estimated Creatinine Clearance: 82.6 mL/min (by C-G formula based on SCr of 0.68 mg/dL). Liver Function Tests: No results for input(s): AST, ALT, ALKPHOS, BILITOT, PROT, ALBUMIN in the last 168 hours. No results for input(s): LIPASE, AMYLASE in the last 168 hours. No results for input(s): AMMONIA in the last 168 hours. Coagulation Profile: No results for input(s): INR, PROTIME in the last 168 hours. Cardiac Enzymes: No results for input(s): CKTOTAL, CKMB, CKMBINDEX, TROPONINI in the last 168 hours. BNP (last 3 results) Recent Labs    03/01/23 1157 08/18/23 1610  PROBNP 399.0* 3,796.0*   HbA1C: No results for input(s): HGBA1C in the last 72 hours. CBG: Recent Labs  Lab 12/01/23 1201 12/01/23 1604 12/01/23 2110 12/02/23 0635 12/02/23 1129  GLUCAP 188* 185* 244* 149* 258*   Lipid Profile: No results for input(s): CHOL, HDL, LDLCALC, TRIG, CHOLHDL, LDLDIRECT in the last 72 hours. Thyroid  Function Tests: No results for input(s): TSH, T4TOTAL, FREET4, T3FREE, THYROIDAB in the last 72 hours. Anemia Panel: No results for input(s): VITAMINB12, FOLATE, FERRITIN, TIBC, IRON , RETICCTPCT in the last 72 hours. Sepsis Labs: No results for input(s): PROCALCITON, LATICACIDVEN in the last 168 hours.  No results found for this or any previous visit (from the past 240 hours).   Radiology Studies: No results found.  Scheduled Meds:  apixaban   5 mg Oral BID   Chlorhexidine  Gluconate Cloth  6 each Topical Daily   collagenase   1 Application Topical Daily   finasteride   5 mg Oral Daily   insulin  aspart  0-5 Units Subcutaneous QHS   insulin  aspart  0-9 Units Subcutaneous TID WC   mirtazapine   15 mg Oral QHS   PARoxetine   20 mg Oral Daily   polyethylene glycol  powder  119 g Oral Once   QUEtiapine   50 mg Oral QHS   Continuous Infusions:  sodium chloride  irrigation      LOS: 19 days   Alejandro Marker, DO [facility] Available via Epic secure chat 7am-7pm After these hours, please refer to coverage provider listed on [email or web address] 12/02/2023, 2:40 PM

## 2023-12-02 NOTE — Plan of Care (Signed)

## 2023-12-02 NOTE — Plan of Care (Signed)
   Problem: Clinical Measurements: Goal: Ability to maintain clinical measurements within normal limits will improve Outcome: Progressing Goal: Will remain free from infection Outcome: Progressing

## 2023-12-03 LAB — GLUCOSE, CAPILLARY
Glucose-Capillary: 187 mg/dL — ABNORMAL HIGH (ref 70–99)
Glucose-Capillary: 155 mg/dL — ABNORMAL HIGH (ref 70–99)
Glucose-Capillary: 201 mg/dL — ABNORMAL HIGH (ref 70–99)
Glucose-Capillary: 216 mg/dL — ABNORMAL HIGH (ref 70–99)

## 2023-12-03 MED ORDER — ASPIRIN 81 MG PO TBEC
81.0000 mg | DELAYED_RELEASE_TABLET | Freq: Every day | ORAL | Status: DC
  Administered 2023-12-03 – 2023-12-24 (×22): 81 mg via ORAL
  Filled 2023-12-03 (×22): qty 1

## 2023-12-03 NOTE — Inpatient Diabetes Management (Signed)
 Inpatient Diabetes Program Recommendations  AACE/ADA: New Consensus Statement on Inpatient Glycemic Control (2015)  Target Ranges:  Prepandial:   less than 140 mg/dL      Peak postprandial:   less than 180 mg/dL (1-2 hours)      Critically ill patients:  140 - 180 mg/dL   Lab Results  Component Value Date   GLUCAP 187 (H) 12/03/2023   HGBA1C 8.3 (H) 11/15/2023    Review of Glycemic Control  Latest Reference Range & Units 12/02/23 06:35 12/02/23 11:29 12/02/23 16:51 12/02/23 21:13 12/03/23 06:09  Glucose-Capillary 70 - 99 mg/dL 850 (H) 741 (H) 691 (H) 238 (H) 187 (H)  (H): Data is abnormally high  Diabetes history: DM2 Outpatient Diabetes medications: Glipizide  5 mg QAM, 2.5 mg QPM, Januvia  100 mg QD Current orders for Inpatient glycemic control: Novolog  0-9 units TID and 0-5 units QHS  Inpatient Diabetes Program Recommendations:    Postprandials elevated.  Please consider:  Novlog 3 units TID with meals if he consumes at least 50%.  Thank you, Wyvonna Pinal, MSN, CDCES Diabetes Coordinator Inpatient Diabetes Program [phone] (team pager from 8a-5p)

## 2023-12-03 NOTE — Progress Notes (Signed)
 Physical Therapy Treatment Patient Details Name: [name] [name] MRN: [id number] DOB: [birth year] Today's Date: 12/03/2023   History of Present Illness 68 y.o. male admitted 11/12/23 for abnormal renal labs. Found to have AKI and potential UTI. Foley catheter was inserted.  He has since been having hematuria, possibly trauma in the setting of Eliquis . Pt with worsening of chronic normocytic anemia. Chest CT showed pleural effusion. Plan for EGD and colonoscopy. PMHx: PAF, chronic A-Fib, CHF, CAD s/p CABG, PAD, T2DM, HTN, HLD, chronic decubitus ulcer,  chronic anemia with heme positive stool, and chronic diarrhea. Of note, recent hospitalization 10/22-11/5 after fall sustaining L SDH with 4mm shift.    PT Comments  Pt fatigued and somewhat resistant to mobility. Worked on STS and increasing standing tolerance. Encouraged pt to ambulate out of room but he deferred due to fatigue. Pt mobilizing with min A for bed mobility, mod A for STS. Pt expresses fear of use of RW, discussed that he is safer with it than without an AD. Patient will benefit from continued inpatient follow up therapy, <3 hours/day. PT will continue to follow.     If plan is discharge home, recommend the following: Assistance with cooking/housework;Assist for transportation;Help with stairs or ramp for entrance;Supervision due to cognitive status;Direct supervision/assist for medications management;A little help with walking and/or transfers;A lot of help with bathing/dressing/bathroom;Direct supervision/assist for financial management   Can travel by private vehicle     Yes  Equipment Recommendations  Wheelchair cushion (measurements PT);Wheelchair (measurements PT);Hospital bed;BSC/3in1    Recommendations for Other Services       Precautions / Restrictions Precautions Precautions: Fall Recall of Precautions/Restrictions: Impaired Precaution/Restrictions Comments: low vision. HOH Restrictions Weight Bearing Restrictions  Per Provider Order: No     Mobility  Bed Mobility Overal bed mobility: Needs Assistance Bed Mobility: Supine to Sit, Sit to Supine     Supine to sit: Min assist, HOB elevated, Used rails Sit to supine: Min assist   General bed mobility comments: Min A HHA to facilitate bringing trunk up from bed. Min verbal cues to initiate and complete management of BLE off of bed. Min A to return to supine to reposition when supine.    Transfers Overall transfer level: Needs assistance Equipment used: Rolling walker (2 wheels) Transfers: Sit to/from Stand Sit to Stand: From elevated surface, Mod assist           General transfer comment: mod A for power up. Performed multiple times for strengthening.    Ambulation/Gait Ambulation/Gait assistance: Min assist Gait Distance (Feet): 3 Feet Assistive device: Rolling walker (2 wheels) Gait Pattern/deviations: Decreased step length - right, Decreased step length - left, Decreased stride length, Step-through pattern, Trunk flexed Gait velocity: reduced Gait velocity interpretation: <1.31 ft/sec, indicative of household ambulator   General Gait Details: pt stepped along EOB, deferred further ambulation or getting into chair due to fatigue and discomfort   Stairs             Wheelchair Mobility     Tilt Bed    Modified Rankin (Stroke Patients Only)       Balance Overall balance assessment: Needs assistance, History of Falls Sitting-balance support: No upper extremity supported, Feet supported Sitting balance-Leahy Scale: Fair Sitting balance - Comments: worked on increasing bouts of static standing to 1 min each before pt saying he needed to sit   Standing balance support: Bilateral upper extremity supported, During functional activity, Reliant on assistive device for balance Standing balance-Leahy Scale: Poor Standing balance comment:  Dependent on RW and external support                            Communication  Communication Communication: Impaired Factors Affecting Communication: Hearing impaired  Cognition Arousal: Alert Behavior During Therapy: Flat affect   PT - Cognitive impairments: No family/caregiver present to determine baseline                       PT - Cognition Comments: pt resistant to mobility but agreed with encouragement Following commands: Impaired Following commands impaired: Follows one step commands with increased time, Follows one step commands inconsistently    Cueing Cueing Techniques: Verbal cues, Tactile cues, Gestural cues  Exercises      General Comments General comments (skin integrity, edema, etc.): pt soiled and seemed unaware. Pt cleaned up, new linens and gown, and new sacral dressing as it was soiled as well      Pertinent Vitals/Pain Pain Assessment Pain Assessment: Faces Faces Pain Scale: Hurts a little bit Pain Location: buttocks Pain Descriptors / Indicators: Discomfort, Sore Pain Intervention(s): Limited activity within patient's tolerance, Monitored during session, Other (comment) (changed sacral dressing)    Home Living                          Prior Function            PT Goals (current goals can now be found in the care plan section) Acute Rehab PT Goals Patient Stated Goal: to improve his strength and endurance PT Goal Formulation: With patient Time For Goal Achievement: 12/12/23 Potential to Achieve Goals: Fair Progress towards PT goals: Not progressing toward goals - comment (low energy)    Frequency    Min 2X/week      PT Plan      Co-evaluation              AM-PAC PT 6 Clicks Mobility   Outcome Measure  Help needed turning from your back to your side while in a flat bed without using bedrails?: A Little Help needed moving from lying on your back to sitting on the side of a flat bed without using bedrails?: A Little Help needed moving to and from a bed to a chair (including a wheelchair)?:  A Little Help needed standing up from a chair using your arms (e.g., wheelchair or bedside chair)?: A Lot Help needed to walk in hospital room?: Total Help needed climbing 3-5 steps with a railing? : Total 6 Click Score: 13    End of Session Equipment Utilized During Treatment: Gait belt Activity Tolerance: Patient tolerated treatment well Patient left: with bed alarm set;in bed;with call bell/phone within reach Nurse Communication: Mobility status PT Visit Diagnosis: Unsteadiness on feet (R26.81);Other abnormalities of gait and mobility (R26.89);Muscle weakness (generalized) (M62.81);History of falling (Z91.81);Dizziness and giddiness (R42);Difficulty in walking, not elsewhere classified (R26.2)     Time: 8369-8342 PT Time Calculation (min) (ACUTE ONLY): 27 min  Charges:    $Therapeutic Activity: 23-37 mins PT General Charges $$ ACUTE PT VISIT: 1 Visit                     Richerd Lipoma, PT  Acute Rehab Services Secure chat preferred Office [phone]    Richerd CROME Renan Danese 12/03/2023, 5:26 PM

## 2023-12-03 NOTE — Progress Notes (Signed)
 Occupational Therapy Treatment Patient Details Name: [name] [name] MRN: [id number] DOB: Aug 06, 1955 Today's Date: 12/03/2023   History of present illness 68 y.o. male admitted 11/12/23 for abnormal renal labs. Found to have AKI and potential UTI. Foley catheter was inserted.  He has since been having hematuria, possibly trauma in the setting of Eliquis . Pt with worsening of chronic normocytic anemia. Chest CT showed pleural effusion. Plan for EGD and colonoscopy. PMHx: PAF, chronic A-Fib, CHF, CAD s/p CABG, PAD, T2DM, HTN, HLD, chronic decubitus ulcer,  chronic anemia with heme positive stool, and chronic diarrhea. Of note, recent hospitalization 10/22-11/5 after fall sustaining L SDH with 4mm shift.   OT comments  Pt progressing towards OT goals this session. Focus of session on progressing functional mobility and increasing activity tolerance for OOB ADL engagement. Pt with improved engagement and alertness this session, requiring Min A for functional transfers. However, cognitive deficits still noted as outlined below. Pt continues to require increased assistance with ADL tasks and increased encouragement to engage in self-care activities. OT to continue to follow Pt acutely, continue per POC.       If plan is discharge home, recommend the following:  A little help with bathing/dressing/bathroom;A lot of help with walking and/or transfers;Direct supervision/assist for financial management;Direct supervision/assist for medications management;Assistance with cooking/housework;Assist for transportation;Help with stairs or ramp for entrance   Equipment Recommendations  None recommended by OT    Recommendations for Other Services      Precautions / Restrictions Precautions Precautions: Fall Recall of Precautions/Restrictions: Impaired Precaution/Restrictions Comments: low vision Restrictions Weight Bearing Restrictions Per Provider Order: No       Mobility Bed Mobility Overal bed  mobility: Needs Assistance Bed Mobility: Supine to Sit, Sit to Supine     Supine to sit: Min assist, HOB elevated, Used rails Sit to supine: Min assist   General bed mobility comments: Min A HHA to facilitate bringing trunk up from bed. Min verbal cues to initiate and complete management of BLE off of bed. Min A to return to supine to reposition when supine.    Transfers Overall transfer level: Needs assistance Equipment used: Rolling walker (2 wheels) Transfers: Sit to/from Stand Sit to Stand: Min assist, From elevated surface           General transfer comment: Min A to rise from bed with height elevated. Pt took four side steps to the left towards [facility] with Min A. Pt only agreeable to mobility OOB to reposition.     Balance Overall balance assessment: Needs assistance, History of Falls Sitting-balance support: No upper extremity supported, Feet supported Sitting balance-Leahy Scale: Fair     Standing balance support: Bilateral upper extremity supported, During functional activity, Reliant on assistive device for balance Standing balance-Leahy Scale: Poor Standing balance comment: Dependent on RW and external support                           ADL either performed or assessed with clinical judgement   ADL Overall ADL's : Needs assistance/impaired                     Lower Body Dressing: Maximal assistance   Toilet Transfer: Minimal assistance;Stand-pivot;BSC/3in1;Rolling walker (2 wheels) Toilet Transfer Details (indicate cue type and reason): Simulated this date Toileting- Clothing Manipulation and Hygiene: Total assistance Toileting - Clothing Manipulation Details (indicate cue type and reason): Catheter in place and stool noted on bed pad. Bed pad replaced with  clean one.            Extremity/Trunk Assessment Upper Extremity Assessment Upper Extremity Assessment: Generalized weakness            Vision   Vision Assessment?: Vision  impaired- to be further tested in functional context Additional Comments: Impaired depth perception. Difficulties looking around to locate objects   Perception     Praxis     Communication Communication Communication: Impaired Factors Affecting Communication: Hearing impaired   Cognition Arousal: Alert Behavior During Therapy: Flat affect Cognition: Cognition impaired   Orientation impairments: Situation Awareness: Online awareness impaired Memory impairment (select all impairments): Short-term memory, Working civil service fast streamer, Conservation officer, historic buildings Attention impairment (select first level of impairment): Sustained attention Executive functioning impairment (select all impairments): Reasoning, Problem solving, Sequencing, Organization OT - Cognition Comments: Pt with improved engagement this session, interactive with therapist and asking questions. Pt with decreased short term memory throughout. In conversation, Pt said he has lived in different places and when asked where?, Pt stated where what?.                 Following commands: Impaired Following commands impaired: Follows one step commands with increased time, Follows one step commands inconsistently      Cueing   Cueing Techniques: Verbal cues, Tactile cues, Gestural cues  Exercises      Shoulder Instructions       General Comments Pt provided with reorientation throughout session, appears to be improving delirium.    Pertinent Vitals/ Pain       Pain Assessment Pain Assessment: Faces Faces Pain Scale: Hurts a little bit Pain Location: BLE Pain Descriptors / Indicators: Discomfort Pain Intervention(s): Limited activity within patient's tolerance, Monitored during session, Repositioned  Home Living                                          Prior Functioning/Environment              Frequency  Min 2X/week        Progress Toward Goals  OT Goals(current goals can now be found  in the care plan section)  Progress towards OT goals: Progressing toward goals  Acute Rehab OT Goals Patient Stated Goal: to sleep OT Goal Formulation: With patient Time For Goal Achievement: 12/05/23 Potential to Achieve Goals: Fair ADL Goals Pt Will Perform Grooming: with modified independence;standing Pt Will Perform Lower Body Dressing: with set-up;sit to/from stand;sitting/lateral leans Pt Will Transfer to Toilet: with supervision;ambulating Pt Will Perform Toileting - Clothing Manipulation and hygiene: with supervision;sit to/from stand;sitting/lateral leans Pt/caregiver will Perform Home Exercise Program: Increased strength;Both right and left upper extremity;With theraband;With Supervision;With written HEP provided Additional ADL Goal #1: Pt will complete pill box test with no more than 3 errors.  Plan      Co-evaluation                 AM-PAC OT 6 Clicks Daily Activity     Outcome Measure   Help from another person eating meals?: A Little Help from another person taking care of personal grooming?: A Little Help from another person toileting, which includes using toliet, bedpan, or urinal?: A Lot Help from another person bathing (including washing, rinsing, drying)?: A Little Help from another person to put on and taking off regular upper body clothing?: A Little Help from another person to put on and taking off  regular lower body clothing?: A Lot 6 Click Score: 16    End of Session Equipment Utilized During Treatment: Gait belt;Rolling walker (2 wheels)  OT Visit Diagnosis: Other abnormalities of gait and mobility (R26.89);Muscle weakness (generalized) (M62.81);Other symptoms and signs involving cognitive function   Activity Tolerance Patient tolerated treatment well   Patient Left in bed;with call bell/phone within reach;with bed alarm set;with SCD's reapplied   Nurse Communication Mobility status        Time: 1600-1616 OT Time Calculation (min): 16  min  Charges: OT General Charges $OT Visit: 1 Visit OT Treatments $Therapeutic Activity: 8-22 mins  Maurilio CROME, OTR/L.  [facility] Acute Rehabilitation  Office: [phone]   Maurilio PARAS Iviona Hole 12/03/2023, 4:39 PM

## 2023-12-03 NOTE — Progress Notes (Signed)
 PROGRESS NOTE    [name] [name]  FMW:[id number] DOB: 03/31/1955 DOA: 11/12/2023 PCP: [name] HERO, PA-C   Brief Narrative:  Del Overfelt is a 68 y.o. male with a history of atrial fibrillation with slow ventricular response, CAD, CABG x 4, aortic aneurysm s/p repair, CVA, diabetes mellitus type 2, heart failure with preserved EF. Patient presented secondary to abnormal metabolic panel and while in the emergency department found to have evidence of gross hematuria, anemia and AKI. Urology consulted. Patient started IV fluids. During workup, patient was Hemoccult positive, GI consulted. Hemoglobin continued to drift down requiring 1 unit PRBC via transfusion. CT imaging on admission was significant for right pleural effusion concerning for possible empyema. Pulmonology consulted for chest tube placement which was performed on 11/14 which was removed on 11/17.  Pulmonary signed off the case on 11/19/2023.   Subsequently underwent workup for his acute blood loss anemia superimposed on chronic hydration deficiency anemia with an upper endoscopy and was found to have gastric and duodenal ulcers.  Has been placed on twice daily PPI for now.  Patient is now agreeable for SNF and medically stable for discharge however insurance has denied the authorization and family has appealed Denial.  Assessment and Plan:  Gross Hematuria: Possibly related to foley catheter trauma and complicated by Eliquis  use. Urology consulted. CT abdomen/pelvis significant for normal kidneys without hydronephrosis, nonobstructing stone versus vascular calcification of the upper pole left kidney, no renal mass.  Hematuria catheter was declined by patient.  Per urology, possible hemorrhagic cystitis. Hematuria catheter placed and CBI started on 11/14. Ur Cx + for 20,000 CFU of Staphylococcus Hemolyticus. Patient completed antibiotics. Bleeding improved. Recommendation to c/w Finasteride  5 mg po Daily on discharge.    Possible UTI: Urine culture obtained. Patient started empirically on Ceftriaxone  and transitioned to Bactrim . Urinalysis with rare bacteruria, pyuria and is nitrite positive; significant squamous cells. Urine culture positive for 20,000 colonies/ml of staphylococcus hemolyticus. Patient completed Bactrim  and Abx.  Urinary retention: Trial of void done however failed so we will continue catheter drainage at discharge and will need outpatient Urology follow-up with continuation of Finasteride .    Acute Blood Loss Anemia superimposed on Chronic IDA: Baseline hemoglobin appears to be around 8-9.  Hemoglobin of 7.5 on admission with downtrend to 6.7 likely related to patient's gross hematuria in addition to heme positive stool.  1 unit of PRBC ordered on 11/13 with posttransfusion hemoglobin up to 8.5.  Hemoglobin/Hct Trend stable and last check was 8.8/27.3. CTM for S/Sx of Bleeding no overt bleeding noted.    AKI: Present on admission with creatinine of 1.48 and peak of 1.50. Resolved. BUN/Cr now 8/0.68 on the last check. Avoid Nephrotoxic Medications, Contrast Dyes, Hypotension and Dehydration to Ensure Adequate Renal Perfusion and will need to Renally Adjust Meds. CTM &Trend Renal Function carefully & repeat CMP intermittently   Heme positive stool: No history of hematochezia or melena. BUN was significantly elevated on admission, but patient also has evidence of dehydration.  Complicated by Apixaban  use. GI consulted and have recommended inpatient EGD/Colonoscopy. Patient initially declined endoscopic procedures, but eventually agreed. Upper endoscopy performed on 11/23 significant for non-ulcer gastritis (biopsied) and multiple non-bleeding duodenal ulcers (biopsied). Plan for colonoscopy was deferred secondary to patient refusing bowel prep (initial view on colonoscopy was poor prep). GI signed off. Patient continues to have no melena or hematochezia even with anticoagulation restarted. Hgb has been  stable.    Gastric Ulcer and Duodenal ulcers: Noted on upper endoscopy.  Pathology consistent with chronic peptic duodenitis (no dysplasia or malignancy) and mild chronic inactive gastritis (no H. Pylori identified, no metaplasia or dyspasia). GI recommended PPI with Pantoprazole  40 mg BID x6 weeks   Paroxysmal A Fib with slow ventricular response: Patient is not on rate control medication secondary to slow ventricular response.  He is managed on Apixaban  secondary to a CHA2DS2-VASc of 4. Apixaban  5 mg po BID had been held secondary to hematuria and associated acute anemia but resumed 11/26. CTM   Right Pleural Effusion: Noted on CT imaging with evidence of moderate-sized collection.  Concern for possible empyema secondary to gas seen on imaging.  Last thoracentesis was from February 2025, which was also right-sided and was secondary to heart failure.  Patient is completely asymptomatic.  Pulmonology consulted and placed a right-sided chest tube on 11/14 and applied lytics for management of loculated effusion. Chest tube successfully removed on 11/17. Antibiotics completed.  With IV ceftriaxone  and Flagyl .  Pulmonary signed off the case on 11/19/2023 and will need outpatient follow-up and repeat CXR in the outpt setting.   DMT2: Uncontrolled with hyperglycemia based on last hemoglobin A1c of 8.3%. C/w Sensitive Novolog  SSI AC/HS. CTM CBGs per Protocol. CBG Trend ranging from 149-308 on the last 7 checks   CAD: S/p CABG x4. He had been resumed aspirin  81 mg p.o. daily previously but was held due to his EGD.  No longer undergoing colonoscopy inpatient.  Will resume now   Aortic Atherosclerosis: Noted on CT imaging.   Aortic aneurysm: Stable.   Chronic HFpEF: Currently stable. Strict I's and O's and Daily Weights. CTM for S/Sx of Volume Overload.    Peripheral Vascular Disease: Noted. Restart ASA 81 mg po Daily. Already back on DOAC with Apixaban  5 mg po BID   Ambulatory Dysfunction: Noted. PT/OT  recommending SNF and now patient is agreeable.  Received a request for peer to peer for SNF and I called Dr. Janit and rang several times and then it went to his voicemail.  I did not receive a callback.  Apparently [employer] has denied the patient for SNF.  Family (Son)  is appealing the denial for the patient and awaiting appeal process and decision; Remains medically stable for discharge   Left Foot Wound: Noted. C/w Wound Care reccs per WOC Nurse. Recent CT Scan showed cutaneous thickening and irregularity corresponded to wound along the lateral and plantar forefoot, overlying the level of the distal fifth metatarsal. No appreciable osteolysis or erosive changes of the underlying fifth metatarsal to suggest acute osteomyelitis, No loculated fluid collection, and Diffusely heterogenous appearance of the bones with decreased osseous mineralization. C/w WOC Care Recc's and continue with collagenase  1 application topically daily and interchanged with Thera honey if necessary  Pressure Injury, poA Wound 09/12/23 1700 Pressure Injury Sacrum Stage 2 -  Partial thickness loss of dermis presenting as a shallow open injury with a red, pink wound bed without slough. (Active)  -C/w Zinc  Oxide 1 application BIDprn   Generalized Anxiety Disorder and Depression: C/w Paroxetine  20 mg po daily, Mirtazepine 15 mg po at bedtime; Also on Quetiapine  25 mg po at bedtime    Cognitive Dysfunction and Delirium: Noted. History of severe delirium during prior hospitalization. Patient now with concerns for hallucinations. Sleep appeared to be compromised. Patient has intermittent confusion. Currently, he lives alone. Dr. Briana had a long discussion with son on 11/24 and discussed limitations if patient shows evidence of capacity as it relates to his desire/request to be discharged.  He discussed that in similar situations, family generally needs to provide assistance to their loved one to improve safety concerns when patient has  capacity to make decisions we may disagree with. Son continues to reiterate that we would be discharging unsafely. He also discussed caregiver support, however son states they cannot afford this. Mental status has improved with correction of sleep-wake cycle. Palliative care consulted per son's request. -Psychiatry recommendations: Seroquel  50 mg nightly -Delirium precautions  GOC/Disposition: After further goals of care discussion patient would like to pursue SNF rehabilitation however was denied by insurance Dr. Janit so family is going to appeal the denial; last PT note from 11/30 continues to recommend SNF -After discussion with TOC the patient's son is appealing the Denial again    DVT prophylaxis: Place and maintain sequential compression device Start: 11/20/23 1505 SCDs Start: 11/13/23 0733 apixaban  (ELIQUIS ) tablet 5 mg    Code Status: Full Code Family Communication: No family present @ bedside  Disposition Plan:  Level of care: Med-Surg Status is: Inpatient Remains inpatient appropriate because: Medically stable for D/C but Insurance Denied Auth and Family has appealed the decision    Consultants:  Psychiatry Palliative care medicine Gastroenterology PCCM/Pulmonary Urology  Procedures:  As delineated as above Had EGD 11/24/2023; colonoscopy was attempted but prep was extremely poor  Antimicrobials:  Anti-infectives (From admission, onward)    Start     Dose/Rate Route Frequency Ordered Stop   11/16/23 1315  sulfamethoxazole -trimethoprim  (BACTRIM  DS) 800-160 MG per tablet 1 tablet        1 tablet Oral Every 12 hours 11/16/23 1202 11/22/23 2203   11/14/23 1630  metroNIDAZOLE  (FLAGYL ) IVPB 500 mg  Status:  Discontinued        500 mg 100 mL/hr over 60 Minutes Intravenous Every 8 hours 11/14/23 1534 11/20/23 1606   11/14/23 0200  cefTRIAXone  (ROCEPHIN ) 1 g in sodium chloride  0.9 % 100 mL IVPB  Status:  Discontinued        1 g 200 mL/hr over 30 Minutes Intravenous Every 24  hours 11/13/23 0821 11/20/23 1606   11/13/23 0215  cefTRIAXone  (ROCEPHIN ) 1 g in sodium chloride  0.9 % 100 mL IVPB        1 g 200 mL/hr over 30 Minutes Intravenous  Once 11/13/23 0208 11/13/23 0411       Subjective: Seen and examined at bedside and is resting and has no complaints.  Denied lightheadedness or dizziness.  No nausea or vomiting.  Feels okay otherwise.  Objective: Vitals:   12/02/23 1506 12/02/23 2033 12/03/23 0330 12/03/23 0730  BP: (!) 145/62 (!) 155/78 (!) 130/56 (!) 146/73  Pulse: (!) 52 (!) 55 (!) 50 (!) 50  Resp: 16 17 16    Temp: 99.1 F (37.3 C) 98 F (36.7 C) 97.9 F (36.6 C) 98.4 F (36.9 C)  TempSrc:   Oral Oral  SpO2: 99% 100% 98% 97%  Weight:      Height:        Intake/Output Summary (Last 24 hours) at 12/03/2023 1123 Last data filed at 12/03/2023 0300 Gross per 24 hour  Intake 250 ml  Output 950 ml  Net -700 ml   Filed Weights   11/12/23 2154  Weight: 68 kg   Examination: Physical Exam:  Constitutional: Chronically ill-appearing Caucasian male in no acute distress appears calm and resting Respiratory: Slightly diminished to auscultation bilaterally, no wheezing, rales, rhonchi or crackles. Normal respiratory effort and patient is not tachypenic. No accessory muscle use.  Unlabored breathing Cardiovascular: RRR,  no murmurs / rubs / gallops. S1 and S2 auscultated.  Mild extremity edema Abdomen: Soft, non-tender, non-distended. Bowel sounds positive.  GU: Deferred.  Has a Foley catheter in place Musculoskeletal: No clubbing / cyanosis of digits/nails. No joint deformity upper and lower extremities. Good ROM, no contractures. Normal strength and muscle tone.  Skin: Did not turn to view sacral ulcer and his foot wound.  No appreciable rashes or lesions on limited skin evaluation Neurologic: CN 2-12 grossly intact with no focal deficits. Romberg sign and cerebellar reflexes not assessed.  Psychiatric: He is awake and alert and appears calm  Data  Reviewed: I have personally reviewed following labs and imaging studies  CBC: No results for input(s): WBC, NEUTROABS, HGB, HCT, MCV, PLT in the last 168 hours. Basic Metabolic Panel: No results for input(s): NA, K, CL, CO2, GLUCOSE, BUN, CREATININE, CALCIUM , MG, PHOS in the last 168 hours. GFR: Estimated Creatinine Clearance: 82.6 mL/min (by C-G formula based on SCr of 0.68 mg/dL). Liver Function Tests: No results for input(s): AST, ALT, ALKPHOS, BILITOT, PROT, ALBUMIN in the last 168 hours. No results for input(s): LIPASE, AMYLASE in the last 168 hours. No results for input(s): AMMONIA in the last 168 hours. Coagulation Profile: No results for input(s): INR, PROTIME in the last 168 hours. Cardiac Enzymes: No results for input(s): CKTOTAL, CKMB, CKMBINDEX, TROPONINI in the last 168 hours. BNP (last 3 results) Recent Labs    03/01/23 1157 08/18/23 1610  PROBNP 399.0* 3,796.0*   HbA1C: No results for input(s): HGBA1C in the last 72 hours. CBG: Recent Labs  Lab 12/02/23 1129 12/02/23 1651 12/02/23 2113 12/03/23 0609 12/03/23 1115  GLUCAP 258* 308* 238* 187* 155*   Lipid Profile: No results for input(s): CHOL, HDL, LDLCALC, TRIG, CHOLHDL, LDLDIRECT in the last 72 hours. Thyroid  Function Tests: No results for input(s): TSH, T4TOTAL, FREET4, T3FREE, THYROIDAB in the last 72 hours. Anemia Panel: No results for input(s): VITAMINB12, FOLATE, FERRITIN, TIBC, IRON , RETICCTPCT in the last 72 hours. Sepsis Labs: No results for input(s): PROCALCITON, LATICACIDVEN in the last 168 hours.  No results found for this or any previous visit (from the past 240 hours).   Radiology Studies: No results found.  Scheduled Meds:  apixaban   5 mg Oral BID   aspirin  EC  81 mg Oral Daily   Chlorhexidine  Gluconate Cloth  6 each Topical Daily   collagenase   1 Application Topical Daily    finasteride   5 mg Oral Daily   insulin  aspart  0-5 Units Subcutaneous QHS   insulin  aspart  0-9 Units Subcutaneous TID WC   mirtazapine   15 mg Oral QHS   PARoxetine   20 mg Oral Daily   polyethylene glycol powder  119 g Oral Once   QUEtiapine   50 mg Oral QHS   Continuous Infusions:  sodium chloride  irrigation      LOS: 20 days   Alejandro Marker, DO [facility] Available via Epic secure chat 7am-7pm After these hours, please refer to coverage provider listed on [email or web address] 12/03/2023, 11:23 AM

## 2023-12-03 NOTE — Plan of Care (Signed)
   Problem: Clinical Measurements: Goal: Ability to maintain clinical measurements within normal limits will improve Outcome: Progressing Goal: Will remain free from infection Outcome: Progressing

## 2023-12-04 DIAGNOSIS — J869 Pyothorax without fistula: Secondary | ICD-10-CM | POA: Diagnosis not present

## 2023-12-04 DIAGNOSIS — J90 Pleural effusion, not elsewhere classified: Secondary | ICD-10-CM | POA: Diagnosis not present

## 2023-12-04 DIAGNOSIS — R310 Gross hematuria: Secondary | ICD-10-CM | POA: Diagnosis not present

## 2023-12-04 DIAGNOSIS — R195 Other fecal abnormalities: Secondary | ICD-10-CM | POA: Diagnosis not present

## 2023-12-04 LAB — GLUCOSE, CAPILLARY
Glucose-Capillary: 145 mg/dL — ABNORMAL HIGH (ref 70–99)
Glucose-Capillary: 176 mg/dL — ABNORMAL HIGH (ref 70–99)
Glucose-Capillary: 154 mg/dL — ABNORMAL HIGH (ref 70–99)
Glucose-Capillary: 119 mg/dL — ABNORMAL HIGH (ref 70–99)

## 2023-12-04 NOTE — TOC Progression Note (Signed)
 Transition of Care [facility]) - Progression Note    Patient Details  Name: [name] [name] MRN: [id number] Date of Birth: [birth year]  Transition of Care [facility]) CM/SW Contact  [name], LCSW Phone Number: 12/04/2023, 1:06 PM  Clinical Narrative:   CSW spoke with [name]/[name]: bed still available if shara is approved.  CSW spoke with pt [name].  He has not heard anything regarding the appeal.  72 hours will be tomorrow. [name] asking if financial counseling can call about medicaid spend down options.  CSW reached out to [name]/financial counseling.     Expected Discharge Plan: Skilled Nursing Facility Barriers to Discharge: Continued Medical Work up               Expected Discharge Plan and Services In-house Referral: Clinical Social Work   Post Acute Care Choice: Skilled Nursing Facility Living arrangements for the past 2 months: Single Family Home                                       Social Drivers of Health (SDOH) Interventions SDOH Screenings   Food Insecurity: No Food Insecurity (11/13/2023)  Recent Concern: Food Insecurity - Food Insecurity Present (08/18/2023)  Housing: Low Risk  (11/13/2023)  Transportation Needs: No Transportation Needs (11/13/2023)  Utilities: Not At Risk (11/13/2023)  Alcohol Screen: Low Risk  (08/19/2023)  Depression (PHQ2-9): Medium Risk (07/26/2023)  Financial Resource Strain: Low Risk  (08/19/2023)  Physical Activity: Inactive (11/21/2022)  Social Connections: Socially Isolated (11/13/2023)  Stress: Stress Concern Present (11/21/2022)  Tobacco Use: High Risk (11/24/2023)  Health Literacy: Adequate Health Literacy (11/21/2022)    Readmission Risk Interventions    08/20/2023    4:20 PM  Readmission Risk Prevention Plan  Transportation Screening Complete  PCP or Specialist Appt within 3-5 Days Complete  HRI or Home Care Consult Complete  Palliative Care Screening Not Applicable  Medication Review (RN Care  Manager) Complete

## 2023-12-04 NOTE — Progress Notes (Signed)
 PROGRESS NOTE    [name] [name]  FMW:[id number] DOB: [birth year] DOA: 11/12/2023 PCP: [name] HERO, PA-C   Brief Narrative: [name] [name] is a 68 y.o. male with a history of atrial fibrillation with slow ventricular response, CAD, CABG x 4, aortic aneurysm s/p repair, CVA, diabetes mellitus type 2, heart failure with preserved EF. Patient presented secondary to abnormal metabolic panel and while in the emergency department found to have evidence of gross hematuria, anemia and AKI. Urology consulted. Patient started IV fluids. During workup, patient was Hemoccult positive, GI consulted. Hemoglobin continued to drift down requiring 1 unit PRBC via transfusion. CT imaging on admission was significant for right pleural effusion concerning for possible empyema. Pulmonology consulted for chest tube placement which was performed on 11/14 which was removed on 11/17.  Discharge complicated by unsafe discharge plan at this time.   Assessment and Plan:  Gross hematuria Possibly related to foley catheter trauma and complicated by Eliquis  use. Urology consulted. CT abdomen/pelvis significant for normal kidneys without hydronephrosis, nonobstructing stone versus vascular calcification of the upper pole left kidney, no renal mass.  Hematuria catheter was declined by patient.  Per urology, possible hemorrhagic cystitis. Hematuria catheter placed and CBI started on 11/14. Urine culture positive for 20,000 colonies/ml of staphylococcus hemolyticus. Patient completed antibiotics. Bleeding improved. Recommendation for finasteride  on discharge.  Possible UTI Urine culture obtained. Patient started empirically on Ceftriaxone . Urinalysis with rare bacteruria, pyuria and is nitrite positive; significant squamous cells. Urine culture positive for 20,000 colonies/ml of staphylococcus hemolyticus. Patient completed Bactrim .  Acute blood loss anemia Chronic iron  deficiency anemia Baseline hemoglobin  appears to be around 8-9.  Hemoglobin of 7.5 on admission with downtrend to 6.7 likely related to patient's gross hematuria in addition to heme positive stool.  1 unit of PRBC ordered on 11/13 with posttransfusion hemoglobin up to 8.5.  Hemoglobin stable.  AKI Present on admission with creatinine of 1.48 and peak of 1.50. Resolved.  Heme positive stool No history of hematochezia or melena.  BUN significantly elevated on admission, but patient also has evidence of dehydration.  Complicated by Eliquis  use. GI consulted and have recommended inpatient EGD/Colonoscopy. Patient initially declined endoscopic procedures, but eventually agreed. Upper endoscopy performed on 11/23 significant for non-ulcer gastritis (biopsied) and multiple non-bleeding duodenal ulcers (biopsied). Plan for colonoscopy deferred secondary to patient refusing bowel prep. GI signed off. Patient continues to have no melena or hematochezia even with anticoagulation restarted. Hemoglobin has been stable.  Gastric ulcer Duodenal ulcers Noted on upper endoscopy. Pathology consistent with chronic peptic duodenitis (no dysplasia or malignancy) and mild chronic inactive gastritis (no H. Pylori identified, no metaplasia or dyspasia). -GI recommendations: Protonix  40 mg BID x6 weeks  Paroxysmal atrial fibrillation with slow ventricular response Patient is not on rate control medication secondary to slow ventricular response.  He is managed on Eliquis  secondary to a CHA2DS2-VASc of 4.  Eliquis  held secondary to hematuria and associated acute anemia. -Eliquis   Right pleural effusion Noted on CT imaging with evidence of moderate-sized collection.  Concern for possible empyema secondary to gas seen on imaging.  Last thoracentesis was from February 2025, which was also right-sided and was secondary to heart failure.  Patient is completely asymptomatic.  Pulmonology consulted and placed a right-sided chest tube on 11/14 and have applied lytics  for management of loculated effusion. Chest tube successfully removed on 11/17. Antibiotics completed.  Diabetes mellitus type 2 Uncontrolled with hyperglycemia based on last hemoglobin A1c of 8.3%.  CAD S/p  CABG x4.  Aortic atherosclerosis Noted on CT imaging.  Aortic aneurysm Stable.  Chronic HFpEF Currently stable.  Peripheral vascular disease Noted. Not on medication therapy.  Ambulatory dysfunction Noted. PT/OT recommending SNF, however patient is declining at this time.  Left foot wound Noted.  Generalized anxiety disorder Depression -Continue Paxil  and Remeron   Cognitive dysfunction Delirium Noted. History of severe delirium during prior hospitalization. Improved with adjustment of antipsychotic regimen. -Continue Seroquel  50 mg at bedtime   DVT prophylaxis: SCDs, Eliquis  Code Status:   Code Status: Full Code Family Communication: None at bedside. Disposition Plan: Discharge to SNF pending bed and insurance. Patient is otherwise medically stable.   Consultants:  Gastroenterology Urology PCCM Psychiatry  Procedures:  Chest tube placement Upper endoscopy  Antimicrobials: Ceftriaxone     Subjective: Patient reports no concerns this morning.  Objective: BP 128/62 (BP Location: Right Arm)   Pulse (!) 54   Temp 98.6 F (37 C)   Resp 16   Ht 6' 3 (1.905 m)   Wt 68 kg   SpO2 100%   BMI 18.75 kg/m   Examination:  General: Appears calm and comfortable. Conversant Respiratory: Respiratory effort normal. No retractions noted. No tachypnea. Cardiovascular: No LE edema Gastrointestinal: Non-distended Psychiatric: Judgement and insight appear normal. Oriented. Mood & affect appropriate.    Data Reviewed: I have personally reviewed following labs and imaging studies  CBC Lab Results  Component Value Date   WBC 5.3 11/24/2023   RBC 3.12 (L) 11/24/2023   HGB 8.8 (L) 11/24/2023   HCT 27.3 (L) 11/24/2023   MCV 87.5 11/24/2023   MCH 28.2  11/24/2023   PLT 217 11/24/2023   MCHC 32.2 11/24/2023   RDW 17.1 (H) 11/24/2023   LYMPHSABS 0.8 11/24/2023   MONOABS 0.5 11/24/2023   EOSABS 0.1 11/24/2023   BASOSABS 0.1 11/24/2023     Last metabolic panel Lab Results  Component Value Date   NA 134 (L) 11/25/2023   K 4.2 11/25/2023   CL 101 11/25/2023   CO2 22 11/25/2023   BUN 8 11/25/2023   CREATININE 0.68 11/25/2023   GLUCOSE 126 (H) 11/25/2023   GFRNONAA >60 11/25/2023   GFRAA >60 06/09/2018   CALCIUM  7.9 (L) 11/25/2023   PHOS 3.5 11/24/2023   PROT 6.2 (L) 11/24/2023   ALBUMIN 2.2 (L) 11/24/2023   LABGLOB 3.8 08/26/2023   BILITOT 1.0 11/24/2023   ALKPHOS 132 (H) 11/24/2023   AST 17 11/24/2023   ALT 7 11/24/2023   ANIONGAP 11 11/25/2023    GFR: Estimated Creatinine Clearance: 82.6 mL/min (by C-G formula based on SCr of 0.68 mg/dL).  No results found for this or any previous visit (from the past 240 hours).     Radiology Studies: No results found.     LOS: 21 days    Elgin Lam, MD [facility] 12/04/2023, 9:31 AM   If 7PM-7AM, please contact night-coverage [email or web address]

## 2023-12-05 DIAGNOSIS — J869 Pyothorax without fistula: Secondary | ICD-10-CM | POA: Diagnosis not present

## 2023-12-05 DIAGNOSIS — R195 Other fecal abnormalities: Secondary | ICD-10-CM | POA: Diagnosis not present

## 2023-12-05 DIAGNOSIS — J90 Pleural effusion, not elsewhere classified: Secondary | ICD-10-CM | POA: Diagnosis not present

## 2023-12-05 DIAGNOSIS — R310 Gross hematuria: Secondary | ICD-10-CM | POA: Diagnosis not present

## 2023-12-05 LAB — GLUCOSE, CAPILLARY
Glucose-Capillary: 140 mg/dL — ABNORMAL HIGH (ref 70–99)
Glucose-Capillary: 151 mg/dL — ABNORMAL HIGH (ref 70–99)
Glucose-Capillary: 146 mg/dL — ABNORMAL HIGH (ref 70–99)
Glucose-Capillary: 192 mg/dL — ABNORMAL HIGH (ref 70–99)

## 2023-12-05 NOTE — TOC Progression Note (Addendum)
 Transition of Care [facility]) - Progression Note    Patient Details  Name: [name] [name] MRN: [id number] Date of Birth: [birth year]  Transition of Care [facility]) CM/SW Contact  [name], LCSW Phone Number: 12/05/2023, 10:16 AM  Clinical Narrative:   TC [name]/Financial Counseling.  He is going to move forward with a medicaid application.  There may be a way where it can be approved despite being over income.    1525: Message from [place].  Appeal was denied but is automatically sent to a 3rd level of appeal, which could take another 72 hours.   Expected Discharge Plan: Skilled Nursing Facility Barriers to Discharge: Continued Medical Work up               Expected Discharge Plan and Services In-house Referral: Clinical Social Work   Post Acute Care Choice: Skilled Nursing Facility Living arrangements for the past 2 months: Single Family Home                                       Social Drivers of Health (SDOH) Interventions SDOH Screenings   Food Insecurity: No Food Insecurity (11/13/2023)  Recent Concern: Food Insecurity - Food Insecurity Present (08/18/2023)  Housing: Low Risk  (11/13/2023)  Transportation Needs: No Transportation Needs (11/13/2023)  Utilities: Not At Risk (11/13/2023)  Alcohol Screen: Low Risk  (08/19/2023)  Depression (PHQ2-9): Medium Risk (07/26/2023)  Financial Resource Strain: Low Risk  (08/19/2023)  Physical Activity: Inactive (11/21/2022)  Social Connections: Socially Isolated (11/13/2023)  Stress: Stress Concern Present (11/21/2022)  Tobacco Use: High Risk (11/24/2023)  Health Literacy: Adequate Health Literacy (11/21/2022)    Readmission Risk Interventions    08/20/2023    4:20 PM  Readmission Risk Prevention Plan  Transportation Screening Complete  PCP or Specialist Appt within 3-5 Days Complete  HRI or Home Care Consult Complete  Palliative Care Screening Not Applicable  Medication Review (RN Care Manager) Complete

## 2023-12-05 NOTE — Plan of Care (Signed)
  Problem: Health Behavior/Discharge Planning: Goal: Ability to manage health-related needs will improve Outcome: Progressing   Problem: Clinical Measurements: Goal: Will remain free from infection Outcome: Progressing   Problem: Nutrition: Goal: Adequate nutrition will be maintained Outcome: Progressing   Problem: Pain Managment: Goal: General experience of comfort will improve and/or be controlled Outcome: Progressing

## 2023-12-05 NOTE — Progress Notes (Signed)
 PROGRESS NOTE    [name] [name]  FMW:[id number] DOB: Sep 09, 1955 DOA: 11/12/2023 PCP: [name] HERO, PA-C   Brief Narrative: [name] [name] is a 68 y.o. male with a history of atrial fibrillation with slow ventricular response, CAD, CABG x 4, aortic aneurysm s/p repair, CVA, diabetes mellitus type 2, heart failure with preserved EF. Patient presented secondary to abnormal metabolic panel and while in the emergency department found to have evidence of gross hematuria, anemia and AKI. Urology consulted. Patient started IV fluids. During workup, patient was Hemoccult positive, GI consulted. Hemoglobin continued to drift down requiring 1 unit PRBC via transfusion. CT imaging on admission was significant for right pleural effusion concerning for possible empyema. Pulmonology consulted for chest tube placement which was performed on 11/14 which was removed on 11/17.  Discharge complicated by unsafe discharge plan at this time.   Assessment and Plan:  Gross hematuria Possibly related to foley catheter trauma and complicated by Eliquis  use. Urology consulted. CT abdomen/pelvis significant for normal kidneys without hydronephrosis, nonobstructing stone versus vascular calcification of the upper pole left kidney, no renal mass.  Hematuria catheter was declined by patient.  Per urology, possible hemorrhagic cystitis. Hematuria catheter placed and CBI started on 11/14. Urine culture positive for 20,000 colonies/ml of staphylococcus hemolyticus. Patient completed antibiotics. Bleeding improved. Recommendation for finasteride  on discharge.  Possible UTI Urine culture obtained. Patient started empirically on Ceftriaxone . Urinalysis with rare bacteruria, pyuria and is nitrite positive; significant squamous cells. Urine culture positive for 20,000 colonies/ml of staphylococcus hemolyticus. Patient completed Bactrim .  Acute blood loss anemia Chronic iron  deficiency anemia Baseline hemoglobin  appears to be around 8-9.  Hemoglobin of 7.5 on admission with downtrend to 6.7 likely related to patient's gross hematuria in addition to heme positive stool.  1 unit of PRBC ordered on 11/13 with posttransfusion hemoglobin up to 8.5.  Hemoglobin stable.  AKI Present on admission with creatinine of 1.48 and peak of 1.50. Resolved.  Heme positive stool No history of hematochezia or melena.  BUN significantly elevated on admission, but patient also has evidence of dehydration.  Complicated by Eliquis  use. GI consulted and have recommended inpatient EGD/Colonoscopy. Patient initially declined endoscopic procedures, but eventually agreed. Upper endoscopy performed on 11/23 significant for non-ulcer gastritis (biopsied) and multiple non-bleeding duodenal ulcers (biopsied). Plan for colonoscopy deferred secondary to patient refusing bowel prep. GI signed off. Patient continues to have no melena or hematochezia even with anticoagulation restarted. Hemoglobin has been stable.  Gastric ulcer Duodenal ulcers Noted on upper endoscopy. Pathology consistent with chronic peptic duodenitis (no dysplasia or malignancy) and mild chronic inactive gastritis (no H. Pylori identified, no metaplasia or dyspasia). -GI recommendations: Protonix  40 mg BID x6 weeks  Paroxysmal atrial fibrillation with slow ventricular response Patient is not on rate control medication secondary to slow ventricular response.  He is managed on Eliquis  secondary to a CHA2DS2-VASc of 4.  Eliquis  held secondary to hematuria and associated acute anemia. -Eliquis   Right pleural effusion Noted on CT imaging with evidence of moderate-sized collection.  Concern for possible empyema secondary to gas seen on imaging.  Last thoracentesis was from February 2025, which was also right-sided and was secondary to heart failure.  Patient is completely asymptomatic.  Pulmonology consulted and placed a right-sided chest tube on 11/14 and have applied lytics  for management of loculated effusion. Chest tube successfully removed on 11/17. Antibiotics completed.  Diabetes mellitus type 2 Uncontrolled with hyperglycemia based on last hemoglobin A1c of 8.3%.  CAD S/p  CABG x4.  Aortic atherosclerosis Noted on CT imaging.  Aortic aneurysm Stable.  Chronic HFpEF Currently stable.  Peripheral vascular disease Noted. Not on medication therapy.  Ambulatory dysfunction Noted. PT/OT recommending SNF, however patient is declining at this time.  Left foot wound Noted.  Generalized anxiety disorder Depression -Continue Paxil  and Remeron   Cognitive dysfunction Delirium Noted. History of severe delirium during prior hospitalization. Improved with adjustment of antipsychotic regimen. -Continue Seroquel  50 mg at bedtime   DVT prophylaxis: SCDs, Eliquis  Code Status:   Code Status: Full Code Family Communication: None at bedside. Disposition Plan: Discharge to SNF pending bed and insurance. Patient is otherwise medically stable.   Consultants:  Gastroenterology Urology PCCM Psychiatry  Procedures:  Chest tube placement Upper endoscopy  Antimicrobials: Ceftriaxone     Subjective: No concerns this morning.  Objective: BP (!) 160/80 (BP Location: Right Arm)   Pulse (!) 57   Temp 98 F (36.7 C)   Resp 16   Ht 6' 3 (1.905 m)   Wt 68 kg   SpO2 97%   BMI 18.75 kg/m   Examination:  General exam: Appears calm and comfortable. Respiratory system: Clear to auscultation. Respiratory effort normal. Cardiovascular system: S1 & S2 heard, RRR. No murmur. Gastrointestinal system: Abdomen is nondistended, soft and nontender. Normal bowel sounds heard. Central nervous system: Alert and oriented. Musculoskeletal: No edema. No calf tenderness Psychiatry: Mood & affect appropriate.    Data Reviewed: I have personally reviewed following labs and imaging studies  CBC Lab Results  Component Value Date   WBC 5.3 11/24/2023   RBC  3.12 (L) 11/24/2023   HGB 8.8 (L) 11/24/2023   HCT 27.3 (L) 11/24/2023   MCV 87.5 11/24/2023   MCH 28.2 11/24/2023   PLT 217 11/24/2023   MCHC 32.2 11/24/2023   RDW 17.1 (H) 11/24/2023   LYMPHSABS 0.8 11/24/2023   MONOABS 0.5 11/24/2023   EOSABS 0.1 11/24/2023   BASOSABS 0.1 11/24/2023     Last metabolic panel Lab Results  Component Value Date   NA 134 (L) 11/25/2023   K 4.2 11/25/2023   CL 101 11/25/2023   CO2 22 11/25/2023   BUN 8 11/25/2023   CREATININE 0.68 11/25/2023   GLUCOSE 126 (H) 11/25/2023   GFRNONAA >60 11/25/2023   GFRAA >60 06/09/2018   CALCIUM  7.9 (L) 11/25/2023   PHOS 3.5 11/24/2023   PROT 6.2 (L) 11/24/2023   ALBUMIN 2.2 (L) 11/24/2023   LABGLOB 3.8 08/26/2023   BILITOT 1.0 11/24/2023   ALKPHOS 132 (H) 11/24/2023   AST 17 11/24/2023   ALT 7 11/24/2023   ANIONGAP 11 11/25/2023    GFR: Estimated Creatinine Clearance: 82.6 mL/min (by C-G formula based on SCr of 0.68 mg/dL).  No results found for this or any previous visit (from the past 240 hours).     Radiology Studies: No results found.     LOS: 22 days    Elgin Lam, MD [facility] 12/05/2023, 8:57 AM   If 7PM-7AM, please contact night-coverage [email or web address]

## 2023-12-05 NOTE — Progress Notes (Signed)
 OT Cancellation Note  Patient Details Name: [name] [name] MRN: [id number] DOB: [birth year]   Cancelled Treatment:    Reason Eval/Treat Not Completed: Patient declined, no reason specified Pt declined participating in skilled OT treatment this afternoon. Stated that he was tired of being told what to do and did not feel like doing anything.  Therapy goals were reviewed and discussed with patient. Determined that pt is progressing in therapy and SBA goals were still appropriate at this time.   Leita Howell, OTR/L,CBIS  Supplemental OT - [facility] Secure Chat Preferred   12/05/2023, 2:52 PM

## 2023-12-05 NOTE — Progress Notes (Signed)
 Daily Progress Note   Patient Name: [name] [name]       Date: 12/05/2023 DOB: [birth year]  Age: 68 y.o. MRN#: [id number] Attending Physician: Briana Elgin LABOR, MD Primary Care Physician: [name], Mardy HERO, PA-C Admit Date: 11/12/2023  Reason for Consultation/Follow-up: Establishing goals of care  Subjective: No complaints, wants a ginger ale  Length of Stay: 22  Current Medications: Scheduled Meds:   apixaban   5 mg Oral BID   aspirin  EC  81 mg Oral Daily   Chlorhexidine  Gluconate Cloth  6 each Topical Daily   collagenase   1 Application Topical Daily   finasteride   5 mg Oral Daily   insulin  aspart  0-5 Units Subcutaneous QHS   insulin  aspart  0-9 Units Subcutaneous TID WC   mirtazapine   15 mg Oral QHS   PARoxetine   20 mg Oral Daily   polyethylene glycol powder  119 g Oral Once   QUEtiapine   50 mg Oral QHS    Continuous Infusions:  sodium chloride  irrigation      PRN Meds: acetaminophen , bisacodyl , QUEtiapine  **OR** haloperidol  lactate, hydrALAZINE , ondansetron  (ZOFRAN ) IV, zinc  oxide  Physical Exam Constitutional:      General: He is not in acute distress.    Appearance: He is ill-appearing.  Pulmonary:     Effort: Pulmonary effort is normal.  Skin:    General: Skin is warm and dry.  Neurological:     Mental Status: He is alert.     Comments: Occasional confusion             Vital Signs: BP (!) 160/80 (BP Location: Right Arm)   Pulse (!) 57   Temp 98 F (36.7 C)   Resp 16   Ht 6' 3 (1.905 m)   Wt 68 kg   SpO2 97%   BMI 18.75 kg/m  SpO2: SpO2: 97 % O2 Device: O2 Device: Room Air O2 Flow Rate:    Intake/output summary:  Intake/Output Summary (Last 24 hours) at 12/05/2023 1401 Last data filed at 12/05/2023 1300 Gross per 24 hour  Intake 240 ml  Output 1700 ml   Net -1460 ml   LBM: Last BM Date : 12/05/23 Baseline Weight: Weight: 68 kg Most recent weight: Weight: 68 kg       Palliative Assessment/Data: PPS 40%      Patient Active Problem List   Diagnosis Date Noted   Duodenal ulcer 11/24/2023   Heme positive stool 11/22/2023   Pleural effusion on right 11/14/2023   Empyema (HCC) 11/14/2023   Pleural effusion 11/14/2023   Occult blood in stools 11/13/2023   Anticoagulated 11/13/2023   Stage ii Pressure injury sacrum, present on admission 11/01/2023   Hypomagnesemia 10/30/2023   Possible UTI 10/30/2023   Subdural hematoma (HCC) 10/23/2023   AKI (acute kidney injury) 09/12/2023   Uremia 09/12/2023   Diabetes (HCC) 09/12/2023   Acute encephalopathy 09/12/2023   Urinary incontinence 08/30/2023   Hx of iron  deficiency anemia 08/22/2023   Sacral decubitus ulcer 08/18/2023   Bilateral pleural effusion 08/18/2023   GAD (generalized anxiety disorder) 08/18/2023   Chronic diastolic CHF (congestive heart failure) (HCC) 02/03/2023   History of CVA (cerebrovascular accident) 11/30/2022   Heart valve vegetation 11/23/2022  Noncompliance 11/20/2022   Requires continuous supervision for activities of daily living (ADL) 11/20/2022   Pre-ulcerative calluses 03/05/2022   Protein-calorie malnutrition, severe 01/06/2022   Atrial fibrillation with slow ventricular response (HCC) 01/03/2022   Visual hallucinations 01/03/2022   Hematuria 01/03/2022   Postherpetic neuralgia 08/11/2021   Blood clotting disorder 07/12/2021   Type 1 diabetes mellitus (HCC) 02/16/2021   Chronic diarrhea 02/16/2021   Sinus bradycardia 02/15/2021   Iron  deficiency anemia 07/14/2020   Diabetic ulcer of left foot associated with type 1 diabetes mellitus (HCC) 06/30/2020   Pressure injury of skin 05/24/2020   BPH with obstruction/lower urinary tract symptoms 03/16/2020   Normocytic anemia 01/13/2020   Hyperlipidemia LDL goal <70 01/13/2020   Peripheral artery disease  01/13/2020   Coronary artery disease involving bypass graft of transplanted heart without angina pectoris 01/13/2020   Paroxysmal atrial fibrillation (HCC) 01/13/2020   Recurrent major depressive disorder, in partial remission 01/13/2020   Major depressive disorder, recurrent episode, moderate (HCC) 12/09/2019   Tear of lateral meniscus of knee 10/05/2019   Aortic dissection (HCC) 02/05/2019   Hypertension 02/05/2019   Psoriasis 02/05/2019   Vitamin D  deficiency 01/07/2019   Microalbuminuria 01/07/2019   Patellar tendonitis of right knee 04/24/2018   Peyronie's disease 06/09/2015   Erectile dysfunction 03/15/2015   S/P aortic aneurysm repair 05/19/2012   S/P CABG (coronary artery bypass graft) 05/19/2012   Diabetic retinopathy (HCC) 02/27/2011   Contracture of hand joint 11/01/2010    Palliative Care Assessment & Plan   HPI: 68 y.o. male with past medical history of atrial fibrillation with slow ventricular response, CAD, CABG x 4, aortic aneurysm s/p repair, CVA, diabetes mellitus type 2, and heart failure with preserved EF admitted on 11/12/2023 with abnormal metabolic panel, gross hematuria, anemia, and AKI. CT imaging on admission was significant for right pleural effusion concerning for possible empyema. Pulmonology consulted for chest tube placement which was performed on 11/14 which was removed on 11/17. Hematuria catheter placed and CBI started on 11/14. Urine culture positive for 20,000 colonies/ml of staphylococcus hemolyticus. Patient completed antibiotics. Bleeding improved. Patient has received 1 unit PRBC and hgb now stable. Patient with hx of severe delirium during previous hospitalization. Delerious again this hospitalization. Psych saw 11/20 and deemed patient had capacity to refuse SNF. PMT consulted for GOC and son request.   Assessment: Follow up today.  Chart reviewed - no change in patient status. Disposition remains an issue and CSW is addressing. Family has appealed  denial of SNF. Also applying for Medicaid?  Went to see patient - a little confused, asking for a drink, when I show him ginger ale is in front of him he seems a little frustrated with this. Attempted to offer other options but he is not interested.  Patient was quite clear about his goals for full code/full scope during last conversation and with no change in medical situation did not readdress especially with his ongoing intermittent confusion and agitation.   Call to [name] to address and questions or concerns. He tells me about his ongoing interactions with his father and confusion - Tues patient expressed he was buying a motorcycle and Wed [name] felt patient was profoundly weak and sleepy.   We review SNF denial, appeal, awaiting decision and potential application for medicaid.   He continues to share his concern that patient would not be safe at home - no one to care for him and too weak to care for himself.   We review trajectory  of patient's illness. Again review that acute issues have been resolved however patient remains frail with multiple chronic diseases. [name] again shares how he feels patient has rapidly declined over the past year. He reviews his poor intake at baseline. We review that this situation is difficult to prognosticate; however I do worry Mr Carlyon will have repeated hospitalizations and would expect decline with each one. [name] understands and agrees.   Reviewed with [name] that palliative will remain involved peripherally and available to him as needed - he has our contact info and will call with needs.   Recommendations/Plan: Full code/full scope Awaiting SNF appeal Reviewed expected trajectory with son who understands PMT available as needed - son has our number and will reach out with needs, patient not particularly interested in ongoing goals discussion  Care plan was discussed with patient and son  Thank you for allowing the Palliative Medicine Team to assist in  the care of this patient.   Total Time 40 minutes Prolonged Time Billed  no   Time spent includes: Detailed review of medical records (labs, imaging, vital signs), medically appropriate exam, discussion with treatment team, counseling and educating patient, family and/or staff, documenting clinical information, medication management and coordination of care.     *Please note that this is a verbal dictation therefore any spelling or grammatical errors are due to the Dragon [employer] One system interpretation.  [name] Jama Barnacle, DNP, [facility] Palliative Medicine Team Team Phone # [phone]  Pager [phone]

## 2023-12-06 DIAGNOSIS — J869 Pyothorax without fistula: Secondary | ICD-10-CM | POA: Diagnosis not present

## 2023-12-06 DIAGNOSIS — R195 Other fecal abnormalities: Secondary | ICD-10-CM | POA: Diagnosis not present

## 2023-12-06 DIAGNOSIS — R310 Gross hematuria: Secondary | ICD-10-CM | POA: Diagnosis not present

## 2023-12-06 DIAGNOSIS — J90 Pleural effusion, not elsewhere classified: Secondary | ICD-10-CM | POA: Diagnosis not present

## 2023-12-06 LAB — GLUCOSE, CAPILLARY
Glucose-Capillary: 165 mg/dL — ABNORMAL HIGH (ref 70–99)
Glucose-Capillary: 132 mg/dL — ABNORMAL HIGH (ref 70–99)
Glucose-Capillary: 114 mg/dL — ABNORMAL HIGH (ref 70–99)
Glucose-Capillary: 108 mg/dL — ABNORMAL HIGH (ref 70–99)

## 2023-12-06 NOTE — Plan of Care (Signed)
  Problem: Elimination: Goal: Will not experience complications related to bowel motility Outcome: Progressing Goal: Will not experience complications related to urinary retention Outcome: Progressing   Problem: Nutritional: Goal: Maintenance of adequate nutrition will improve Outcome: Progressing   Problem: Skin Integrity: Goal: Risk for impaired skin integrity will decrease Outcome: Progressing

## 2023-12-06 NOTE — Progress Notes (Signed)
 PROGRESS NOTE    [name] [name]  FMW:[id number] DOB: 21-Aug-1955 DOA: 11/12/2023 PCP: [name] HERO, PA-C   Brief Narrative: [name] [name] is a 68 y.o. male with a history of atrial fibrillation with slow ventricular response, CAD, CABG x 4, aortic aneurysm s/p repair, CVA, diabetes mellitus type 2, heart failure with preserved EF. Patient presented secondary to abnormal metabolic panel and while in the emergency department found to have evidence of gross hematuria, anemia and AKI. Urology consulted. Patient started IV fluids. During workup, patient was Hemoccult positive, GI consulted. Hemoglobin continued to drift down requiring 1 unit PRBC via transfusion. CT imaging on admission was significant for right pleural effusion concerning for possible empyema. Pulmonology consulted for chest tube placement which was performed on 11/14 which was removed on 11/17.  Discharge complicated by unsafe discharge plan at this time.   Assessment and Plan:  Gross hematuria Possibly related to foley catheter trauma and complicated by Eliquis  use. Urology consulted. CT abdomen/pelvis significant for normal kidneys without hydronephrosis, nonobstructing stone versus vascular calcification of the upper pole left kidney, no renal mass.  Hematuria catheter was declined by patient.  Per urology, possible hemorrhagic cystitis. Hematuria catheter placed and CBI started on 11/14. Urine culture positive for 20,000 colonies/ml of staphylococcus hemolyticus. Patient completed antibiotics. Bleeding improved. Recommendation for finasteride  on discharge.  Possible UTI Urine culture obtained. Patient started empirically on Ceftriaxone . Urinalysis with rare bacteruria, pyuria and is nitrite positive; significant squamous cells. Urine culture positive for 20,000 colonies/ml of staphylococcus hemolyticus. Patient completed Bactrim .  Acute blood loss anemia Chronic iron  deficiency anemia Baseline hemoglobin  appears to be around 8-9.  Hemoglobin of 7.5 on admission with downtrend to 6.7 likely related to patient's gross hematuria in addition to heme positive stool.  1 unit of PRBC ordered on 11/13 with posttransfusion hemoglobin up to 8.5.  Hemoglobin stable.  AKI Present on admission with creatinine of 1.48 and peak of 1.50. Resolved.  Heme positive stool No history of hematochezia or melena.  BUN significantly elevated on admission, but patient also has evidence of dehydration.  Complicated by Eliquis  use. GI consulted and have recommended inpatient EGD/Colonoscopy. Patient initially declined endoscopic procedures, but eventually agreed. Upper endoscopy performed on 11/23 significant for non-ulcer gastritis (biopsied) and multiple non-bleeding duodenal ulcers (biopsied). Plan for colonoscopy deferred secondary to patient refusing bowel prep. GI signed off. Patient continues to have no melena or hematochezia even with anticoagulation restarted. Hemoglobin has been stable.  Gastric ulcer Duodenal ulcers Noted on upper endoscopy. Pathology consistent with chronic peptic duodenitis (no dysplasia or malignancy) and mild chronic inactive gastritis (no H. Pylori identified, no metaplasia or dyspasia). -GI recommendations: Protonix  40 mg BID x6 weeks  Paroxysmal atrial fibrillation with slow ventricular response Patient is not on rate control medication secondary to slow ventricular response.  He is managed on Eliquis  secondary to a CHA2DS2-VASc of 4.  Eliquis  held secondary to hematuria and associated acute anemia. -Eliquis   Right pleural effusion Noted on CT imaging with evidence of moderate-sized collection.  Concern for possible empyema secondary to gas seen on imaging.  Last thoracentesis was from February 2025, which was also right-sided and was secondary to heart failure.  Patient is completely asymptomatic.  Pulmonology consulted and placed a right-sided chest tube on 11/14 and have applied lytics  for management of loculated effusion. Chest tube successfully removed on 11/17. Antibiotics completed.  Diabetes mellitus type 2 Uncontrolled with hyperglycemia based on last hemoglobin A1c of 8.3%.  CAD S/p  CABG x4.  Aortic atherosclerosis Noted on CT imaging.  Aortic aneurysm Stable.  Chronic HFpEF Currently stable.  Peripheral vascular disease Noted. Not on medication therapy.  Ambulatory dysfunction Noted. PT/OT recommending SNF, however patient is declining at this time.  Left foot wound Noted.  Generalized anxiety disorder Depression -Continue Paxil  and Remeron   Cognitive dysfunction Delirium Noted. History of severe delirium during prior hospitalization. Improved with adjustment of antipsychotic regimen. -Continue Seroquel  50 mg at bedtime   DVT prophylaxis: SCDs, Eliquis  Code Status:   Code Status: Full Code Family Communication: None at bedside. Disposition Plan: Discharge to SNF pending bed and insurance. Patient is otherwise medically stable.   Consultants:  Gastroenterology Urology PCCM Psychiatry  Procedures:  Chest tube placement Upper endoscopy  Antimicrobials: Ceftriaxone     Subjective: No issues noted from overnight events.  Objective: BP 126/78 (BP Location: Right Arm)   Pulse (!) 54   Temp 97.8 F (36.6 C)   Resp 16   Ht 6' 3 (1.905 m)   Wt 68 kg   SpO2 98%   BMI 18.75 kg/m   Examination:  General exam: Appears calm and comfortable. Respiratory system: Clear to auscultation. Respiratory effort normal. Cardiovascular system: S1 & S2 heard, RRR. No murmur.    Data Reviewed: I have personally reviewed following labs and imaging studies  CBC Lab Results  Component Value Date   WBC 5.3 11/24/2023   RBC 3.12 (L) 11/24/2023   HGB 8.8 (L) 11/24/2023   HCT 27.3 (L) 11/24/2023   MCV 87.5 11/24/2023   MCH 28.2 11/24/2023   PLT 217 11/24/2023   MCHC 32.2 11/24/2023   RDW 17.1 (H) 11/24/2023   LYMPHSABS 0.8 11/24/2023    MONOABS 0.5 11/24/2023   EOSABS 0.1 11/24/2023   BASOSABS 0.1 11/24/2023     Last metabolic panel Lab Results  Component Value Date   NA 134 (L) 11/25/2023   K 4.2 11/25/2023   CL 101 11/25/2023   CO2 22 11/25/2023   BUN 8 11/25/2023   CREATININE 0.68 11/25/2023   GLUCOSE 126 (H) 11/25/2023   GFRNONAA >60 11/25/2023   GFRAA >60 06/09/2018   CALCIUM  7.9 (L) 11/25/2023   PHOS 3.5 11/24/2023   PROT 6.2 (L) 11/24/2023   ALBUMIN 2.2 (L) 11/24/2023   LABGLOB 3.8 08/26/2023   BILITOT 1.0 11/24/2023   ALKPHOS 132 (H) 11/24/2023   AST 17 11/24/2023   ALT 7 11/24/2023   ANIONGAP 11 11/25/2023    GFR: Estimated Creatinine Clearance: 82.6 mL/min (by C-G formula based on SCr of 0.68 mg/dL).  No results found for this or any previous visit (from the past 240 hours).     Radiology Studies: No results found.     LOS: 23 days    Elgin Lam, MD [facility] 12/06/2023, 9:31 AM   If 7PM-7AM, please contact night-coverage [email or web address]

## 2023-12-06 NOTE — Progress Notes (Signed)
 PT Cancellation Note  Patient Details Name: [name] [name] MRN: [id number] DOB: [birth year]   Cancelled Treatment:    Reason Eval/Treat Not Completed: Patient declined, no reason specified; time spent encouraging patient to participate and mobilize and detriments of immobility though he continued to ignore and finally asked to be left alone. RN aware.  Will continue attempts.   Montie Portal 12/06/2023, 1:32 PM Micheline Portal, PT Acute Rehabilitation Services [phone] 12/06/2023

## 2023-12-06 NOTE — Plan of Care (Signed)

## 2023-12-07 ENCOUNTER — Other Ambulatory Visit: Payer: Self-pay | Admitting: Physician Assistant

## 2023-12-07 DIAGNOSIS — R310 Gross hematuria: Secondary | ICD-10-CM | POA: Diagnosis not present

## 2023-12-07 DIAGNOSIS — R195 Other fecal abnormalities: Secondary | ICD-10-CM | POA: Diagnosis not present

## 2023-12-07 DIAGNOSIS — J90 Pleural effusion, not elsewhere classified: Secondary | ICD-10-CM | POA: Diagnosis not present

## 2023-12-07 DIAGNOSIS — J869 Pyothorax without fistula: Secondary | ICD-10-CM | POA: Diagnosis not present

## 2023-12-07 LAB — GLUCOSE, CAPILLARY
Glucose-Capillary: 110 mg/dL — ABNORMAL HIGH (ref 70–99)
Glucose-Capillary: 164 mg/dL — ABNORMAL HIGH (ref 70–99)
Glucose-Capillary: 136 mg/dL — ABNORMAL HIGH (ref 70–99)
Glucose-Capillary: 228 mg/dL — ABNORMAL HIGH (ref 70–99)

## 2023-12-07 NOTE — Plan of Care (Signed)
  Problem: Education: Goal: Knowledge of General Education information will improve Description: Including pain rating scale, medication(s)/side effects and non-pharmacologic comfort measures Outcome: Progressing   Problem: Activity: Goal: Risk for activity intolerance will decrease Outcome: Progressing   Problem: Pain Managment: Goal: General experience of comfort will improve and/or be controlled Outcome: Progressing   Problem: Skin Integrity: Goal: Risk for impaired skin integrity will decrease Outcome: Progressing   Problem: Skin Integrity: Goal: Risk for impaired skin integrity will decrease Outcome: Progressing   Problem: Tissue Perfusion: Goal: Adequacy of tissue perfusion will improve Outcome: Progressing

## 2023-12-07 NOTE — Plan of Care (Signed)

## 2023-12-07 NOTE — Progress Notes (Signed)
 PROGRESS NOTE    [name] [name]  FMW:[id number] DOB: 05-Apr-1955 DOA: 11/12/2023 PCP: [name] HERO, PA-C   Brief Narrative: [name] [name] is a 68 y.o. male with a history of atrial fibrillation with slow ventricular response, CAD, CABG x 4, aortic aneurysm s/p repair, CVA, diabetes mellitus type 2, heart failure with preserved EF. Patient presented secondary to abnormal metabolic panel and while in the emergency department found to have evidence of gross hematuria, anemia and AKI. Urology consulted. Patient started IV fluids. During workup, patient was Hemoccult positive, GI consulted. Hemoglobin continued to drift down requiring 1 unit PRBC via transfusion. CT imaging on admission was significant for right pleural effusion concerning for possible empyema. Pulmonology consulted for chest tube placement which was performed on 11/14 which was removed on 11/17.  Discharge complicated by unsafe discharge plan at this time.   Assessment and Plan:  Gross hematuria Possibly related to foley catheter trauma and complicated by Eliquis  use. Urology consulted. CT abdomen/pelvis significant for normal kidneys without hydronephrosis, nonobstructing stone versus vascular calcification of the upper pole left kidney, no renal mass.  Hematuria catheter was declined by patient.  Per urology, possible hemorrhagic cystitis. Hematuria catheter placed and CBI started on 11/14. Urine culture positive for 20,000 colonies/ml of staphylococcus hemolyticus. Patient completed antibiotics. Bleeding improved. Recommendation for finasteride  on discharge.  Possible UTI Urine culture obtained. Patient started empirically on Ceftriaxone . Urinalysis with rare bacteruria, pyuria and is nitrite positive; significant squamous cells. Urine culture positive for 20,000 colonies/ml of staphylococcus hemolyticus. Patient completed Bactrim .  Acute blood loss anemia Chronic iron  deficiency anemia Baseline hemoglobin  appears to be around 8-9.  Hemoglobin of 7.5 on admission with downtrend to 6.7 likely related to patient's gross hematuria in addition to heme positive stool.  1 unit of PRBC ordered on 11/13 with posttransfusion hemoglobin up to 8.5.  Hemoglobin stable.  AKI Present on admission with creatinine of 1.48 and peak of 1.50. Resolved.  Heme positive stool No history of hematochezia or melena.  BUN significantly elevated on admission, but patient also has evidence of dehydration.  Complicated by Eliquis  use. GI consulted and have recommended inpatient EGD/Colonoscopy. Patient initially declined endoscopic procedures, but eventually agreed. Upper endoscopy performed on 11/23 significant for non-ulcer gastritis (biopsied) and multiple non-bleeding duodenal ulcers (biopsied). Plan for colonoscopy deferred secondary to patient refusing bowel prep. GI signed off. Patient continues to have no melena or hematochezia even with anticoagulation restarted. Hemoglobin has been stable.  Gastric ulcer Duodenal ulcers Noted on upper endoscopy. Pathology consistent with chronic peptic duodenitis (no dysplasia or malignancy) and mild chronic inactive gastritis (no H. Pylori identified, no metaplasia or dyspasia). -GI recommendations: Protonix  40 mg BID x6 weeks  Paroxysmal atrial fibrillation with slow ventricular response Patient is not on rate control medication secondary to slow ventricular response.  He is managed on Eliquis  secondary to a CHA2DS2-VASc of 4.  Eliquis  held secondary to hematuria and associated acute anemia. -Eliquis   Right pleural effusion Noted on CT imaging with evidence of moderate-sized collection.  Concern for possible empyema secondary to gas seen on imaging.  Last thoracentesis was from February 2025, which was also right-sided and was secondary to heart failure.  Patient is completely asymptomatic.  Pulmonology consulted and placed a right-sided chest tube on 11/14 and have applied lytics  for management of loculated effusion. Chest tube successfully removed on 11/17. Antibiotics completed.  Diabetes mellitus type 2 Uncontrolled with hyperglycemia based on last hemoglobin A1c of 8.3%.  CAD S/p  CABG x4.  Aortic atherosclerosis Noted on CT imaging.  Aortic aneurysm Stable.  Chronic HFpEF Currently stable.  Peripheral vascular disease Noted. Not on medication therapy.  Ambulatory dysfunction Noted. PT/OT recommending SNF, however patient is declining at this time.  Left foot wound Noted.  Generalized anxiety disorder Depression -Continue Paxil  and Remeron   Cognitive dysfunction Delirium Noted. History of severe delirium during prior hospitalization. Improved with adjustment of antipsychotic regimen. -Continue Seroquel  50 mg at bedtime   DVT prophylaxis: SCDs, Eliquis  Code Status:   Code Status: Full Code Family Communication: None at bedside. Disposition Plan: Discharge to SNF pending bed and insurance. Patient is otherwise medically stable.   Consultants:  Gastroenterology Urology PCCM Psychiatry  Procedures:  Chest tube placement Upper endoscopy  Antimicrobials: Ceftriaxone     Subjective: Patient without concerns this morning.  Objective: BP 127/74 (BP Location: Right Arm)   Pulse (!) 53   Temp 98.4 F (36.9 C) (Oral)   Resp 16   Ht 6' 3 (1.905 m)   Wt 68 kg   SpO2 98%   BMI 18.75 kg/m   Examination:  General exam: Appears calm and comfortable. Respiratory system: Respiratory effort normal. Central nervous system: Alert and oriented. Psychiatry: Mood & affect appropriate.    Data Reviewed: I have personally reviewed following labs and imaging studies  CBC Lab Results  Component Value Date   WBC 5.3 11/24/2023   RBC 3.12 (L) 11/24/2023   HGB 8.8 (L) 11/24/2023   HCT 27.3 (L) 11/24/2023   MCV 87.5 11/24/2023   MCH 28.2 11/24/2023   PLT 217 11/24/2023   MCHC 32.2 11/24/2023   RDW 17.1 (H) 11/24/2023   LYMPHSABS  0.8 11/24/2023   MONOABS 0.5 11/24/2023   EOSABS 0.1 11/24/2023   BASOSABS 0.1 11/24/2023     Last metabolic panel Lab Results  Component Value Date   NA 134 (L) 11/25/2023   K 4.2 11/25/2023   CL 101 11/25/2023   CO2 22 11/25/2023   BUN 8 11/25/2023   CREATININE 0.68 11/25/2023   GLUCOSE 126 (H) 11/25/2023   GFRNONAA >60 11/25/2023   GFRAA >60 06/09/2018   CALCIUM  7.9 (L) 11/25/2023   PHOS 3.5 11/24/2023   PROT 6.2 (L) 11/24/2023   ALBUMIN 2.2 (L) 11/24/2023   LABGLOB 3.8 08/26/2023   BILITOT 1.0 11/24/2023   ALKPHOS 132 (H) 11/24/2023   AST 17 11/24/2023   ALT 7 11/24/2023   ANIONGAP 11 11/25/2023    GFR: Estimated Creatinine Clearance: 82.6 mL/min (by C-G formula based on SCr of 0.68 mg/dL).  No results found for this or any previous visit (from the past 240 hours).     Radiology Studies: No results found.     LOS: 24 days    Elgin Lam, MD [facility] 12/07/2023, 8:11 AM   If 7PM-7AM, please contact night-coverage [email or web address]

## 2023-12-08 DIAGNOSIS — R310 Gross hematuria: Secondary | ICD-10-CM | POA: Diagnosis not present

## 2023-12-08 DIAGNOSIS — R195 Other fecal abnormalities: Secondary | ICD-10-CM | POA: Diagnosis not present

## 2023-12-08 DIAGNOSIS — J869 Pyothorax without fistula: Secondary | ICD-10-CM | POA: Diagnosis not present

## 2023-12-08 DIAGNOSIS — J90 Pleural effusion, not elsewhere classified: Secondary | ICD-10-CM | POA: Diagnosis not present

## 2023-12-08 LAB — GLUCOSE, CAPILLARY
Glucose-Capillary: 191 mg/dL — ABNORMAL HIGH (ref 70–99)
Glucose-Capillary: 185 mg/dL — ABNORMAL HIGH (ref 70–99)
Glucose-Capillary: 181 mg/dL — ABNORMAL HIGH (ref 70–99)
Glucose-Capillary: 180 mg/dL — ABNORMAL HIGH (ref 70–99)
Glucose-Capillary: 95 mg/dL (ref 70–99)

## 2023-12-08 NOTE — Plan of Care (Signed)

## 2023-12-08 NOTE — Progress Notes (Signed)
 PROGRESS NOTE    [name] [name]  FMW:[id number] DOB: June 15, 1955 DOA: 11/12/2023 PCP: [name] HERO, PA-C   Brief Narrative: [name] [name] is a 68 y.o. male with a history of atrial fibrillation with slow ventricular response, CAD, CABG x 4, aortic aneurysm s/p repair, CVA, diabetes mellitus type 2, heart failure with preserved EF. Patient presented secondary to abnormal metabolic panel and while in the emergency department found to have evidence of gross hematuria, anemia and AKI. Urology consulted. Patient started IV fluids. During workup, patient was Hemoccult positive, GI consulted. Hemoglobin continued to drift down requiring 1 unit PRBC via transfusion. CT imaging on admission was significant for right pleural effusion concerning for possible empyema. Pulmonology consulted for chest tube placement which was performed on 11/14 which was removed on 11/17.  Discharge complicated by unsafe discharge plan at this time.   Assessment and Plan:  Gross hematuria Possibly related to foley catheter trauma and complicated by Eliquis  use. Urology consulted. CT abdomen/pelvis significant for normal kidneys without hydronephrosis, nonobstructing stone versus vascular calcification of the upper pole left kidney, no renal mass.  Hematuria catheter was declined by patient.  Per urology, possible hemorrhagic cystitis. Hematuria catheter placed and CBI started on 11/14. Urine culture positive for 20,000 colonies/ml of staphylococcus hemolyticus. Patient completed antibiotics. Bleeding improved. Recommendation for finasteride  on discharge.  Possible UTI Urine culture obtained. Patient started empirically on Ceftriaxone . Urinalysis with rare bacteruria, pyuria and is nitrite positive; significant squamous cells. Urine culture positive for 20,000 colonies/ml of staphylococcus hemolyticus. Patient completed Bactrim .  Acute blood loss anemia Chronic iron  deficiency anemia Baseline hemoglobin  appears to be around 8-9.  Hemoglobin of 7.5 on admission with downtrend to 6.7 likely related to patient's gross hematuria in addition to heme positive stool.  1 unit of PRBC ordered on 11/13 with posttransfusion hemoglobin up to 8.5.  Hemoglobin stable.  AKI Present on admission with creatinine of 1.48 and peak of 1.50. Resolved.  Heme positive stool No history of hematochezia or melena.  BUN significantly elevated on admission, but patient also has evidence of dehydration.  Complicated by Eliquis  use. GI consulted and have recommended inpatient EGD/Colonoscopy. Patient initially declined endoscopic procedures, but eventually agreed. Upper endoscopy performed on 11/23 significant for non-ulcer gastritis (biopsied) and multiple non-bleeding duodenal ulcers (biopsied). Plan for colonoscopy deferred secondary to patient refusing bowel prep. GI signed off. Patient continues to have no melena or hematochezia even with anticoagulation restarted. Hemoglobin has been stable.  Gastric ulcer Duodenal ulcers Noted on upper endoscopy. Pathology consistent with chronic peptic duodenitis (no dysplasia or malignancy) and mild chronic inactive gastritis (no H. Pylori identified, no metaplasia or dyspasia). -GI recommendations: Protonix  40 mg BID x6 weeks  Paroxysmal atrial fibrillation with slow ventricular response Patient is not on rate control medication secondary to slow ventricular response.  He is managed on Eliquis  secondary to a CHA2DS2-VASc of 4.  Eliquis  held secondary to hematuria and associated acute anemia. -Eliquis   Right pleural effusion Noted on CT imaging with evidence of moderate-sized collection.  Concern for possible empyema secondary to gas seen on imaging.  Last thoracentesis was from February 2025, which was also right-sided and was secondary to heart failure.  Patient is completely asymptomatic.  Pulmonology consulted and placed a right-sided chest tube on 11/14 and have applied lytics  for management of loculated effusion. Chest tube successfully removed on 11/17. Antibiotics completed.  Diabetes mellitus type 2 Uncontrolled with hyperglycemia based on last hemoglobin A1c of 8.3%.  CAD S/p  CABG x4.  Aortic atherosclerosis Noted on CT imaging.  Aortic aneurysm Stable.  Chronic HFpEF Currently stable.  Peripheral vascular disease Noted. Not on medication therapy.  Ambulatory dysfunction Noted. PT/OT recommending SNF, however patient is declining at this time.  Left foot wound Noted.  Generalized anxiety disorder Depression -Continue Paxil  and Remeron   Cognitive dysfunction Delirium Noted. History of severe delirium during prior hospitalization. Improved with adjustment of antipsychotic regimen. -Continue Seroquel  50 mg at bedtime   DVT prophylaxis: SCDs, Eliquis  Code Status:   Code Status: Full Code Family Communication: None at bedside. Disposition Plan: Discharge to SNF pending bed and insurance. Patient is otherwise medically stable.   Consultants:  Gastroenterology Urology PCCM Psychiatry  Procedures:  Chest tube placement Upper endoscopy  Antimicrobials: Ceftriaxone     Subjective: No issues noted from overnight events.  Objective: BP 115/63 (BP Location: Right Arm)   Pulse (!) 50   Temp 97.6 F (36.4 C) (Oral)   Resp 16   Ht 6' 3 (1.905 m)   Wt 68 kg   SpO2 98%   BMI 18.75 kg/m   Examination:  General exam: Appears calm and comfortable. Respiratory system: Respiratory effort normal.    Data Reviewed: I have personally reviewed following labs and imaging studies  CBC Lab Results  Component Value Date   WBC 5.3 11/24/2023   RBC 3.12 (L) 11/24/2023   HGB 8.8 (L) 11/24/2023   HCT 27.3 (L) 11/24/2023   MCV 87.5 11/24/2023   MCH 28.2 11/24/2023   PLT 217 11/24/2023   MCHC 32.2 11/24/2023   RDW 17.1 (H) 11/24/2023   LYMPHSABS 0.8 11/24/2023   MONOABS 0.5 11/24/2023   EOSABS 0.1 11/24/2023   BASOSABS 0.1  11/24/2023     Last metabolic panel Lab Results  Component Value Date   NA 134 (L) 11/25/2023   K 4.2 11/25/2023   CL 101 11/25/2023   CO2 22 11/25/2023   BUN 8 11/25/2023   CREATININE 0.68 11/25/2023   GLUCOSE 126 (H) 11/25/2023   GFRNONAA >60 11/25/2023   GFRAA >60 06/09/2018   CALCIUM  7.9 (L) 11/25/2023   PHOS 3.5 11/24/2023   PROT 6.2 (L) 11/24/2023   ALBUMIN 2.2 (L) 11/24/2023   LABGLOB 3.8 08/26/2023   BILITOT 1.0 11/24/2023   ALKPHOS 132 (H) 11/24/2023   AST 17 11/24/2023   ALT 7 11/24/2023   ANIONGAP 11 11/25/2023    GFR: Estimated Creatinine Clearance: 82.6 mL/min (by C-G formula based on SCr of 0.68 mg/dL).  No results found for this or any previous visit (from the past 240 hours).     Radiology Studies: No results found.     LOS: 25 days    Elgin Lam, MD [facility] 12/08/2023, 12:39 PM   If 7PM-7AM, please contact night-coverage [email or web address]

## 2023-12-09 DIAGNOSIS — R310 Gross hematuria: Secondary | ICD-10-CM | POA: Diagnosis not present

## 2023-12-09 DIAGNOSIS — R195 Other fecal abnormalities: Secondary | ICD-10-CM | POA: Diagnosis not present

## 2023-12-09 DIAGNOSIS — J90 Pleural effusion, not elsewhere classified: Secondary | ICD-10-CM | POA: Diagnosis not present

## 2023-12-09 DIAGNOSIS — J869 Pyothorax without fistula: Secondary | ICD-10-CM | POA: Diagnosis not present

## 2023-12-09 LAB — GLUCOSE, CAPILLARY
Glucose-Capillary: 126 mg/dL — ABNORMAL HIGH (ref 70–99)
Glucose-Capillary: 114 mg/dL — ABNORMAL HIGH (ref 70–99)
Glucose-Capillary: 161 mg/dL — ABNORMAL HIGH (ref 70–99)
Glucose-Capillary: 226 mg/dL — ABNORMAL HIGH (ref 70–99)
Glucose-Capillary: 291 mg/dL — ABNORMAL HIGH (ref 70–99)

## 2023-12-09 NOTE — Plan of Care (Signed)

## 2023-12-09 NOTE — TOC Progression Note (Signed)
 Transition of Care [facility]) - Progression Note    Patient Details  Name: Rocio Wolak MRN: [id number] Date of Birth: Nov 24, 1955  [facility] CM/SW Contact  [name], LCSW Phone Number: 12/09/2023, 10:41 AM  Clinical Narrative:   Voice mail from son [name] after hours on Friday stating final appeal was denied.  CSW spoke with ICM supervisor [name].  She will contact [employer] to discuss options.    Message from [name]/financial counseling: he is meeting with pt and son tomorrow 1pm to complete medicaid application.    Expected Discharge Plan: Skilled Nursing Facility Barriers to Discharge: Continued Medical Work up               Expected Discharge Plan and Services In-house Referral: Clinical Social Work   Post Acute Care Choice: Skilled Nursing Facility Living arrangements for the past 2 months: Single Family Home                                       Social Drivers of Health (SDOH) Interventions SDOH Screenings   Food Insecurity: No Food Insecurity (11/13/2023)  Recent Concern: Food Insecurity - Food Insecurity Present (08/18/2023)  Housing: Low Risk  (11/13/2023)  Transportation Needs: No Transportation Needs (11/13/2023)  Utilities: Not At Risk (11/13/2023)  Alcohol Screen: Low Risk  (08/19/2023)  Depression (PHQ2-9): Medium Risk (07/26/2023)  Financial Resource Strain: Low Risk  (08/19/2023)  Physical Activity: Inactive (11/21/2022)  Social Connections: Socially Isolated (11/13/2023)  Stress: Stress Concern Present (11/21/2022)  Tobacco Use: High Risk (11/24/2023)  Health Literacy: Adequate Health Literacy (11/21/2022)    Readmission Risk Interventions    08/20/2023    4:20 PM  Readmission Risk Prevention Plan  Transportation Screening Complete  PCP or Specialist Appt within 3-5 Days Complete  HRI or Home Care Consult Complete  Palliative Care Screening Not Applicable  Medication Review (RN Care Manager) Complete

## 2023-12-09 NOTE — Telephone Encounter (Signed)
 Last OV: 08/29/23  Next OV: 12/19/23  Showing previously filled by another provider, please advise on refill for patient

## 2023-12-09 NOTE — Progress Notes (Signed)
 PROGRESS NOTE    [name] [name]  FMW:[id number] DOB: 05/16/1955 DOA: 11/12/2023 PCP: [name] HERO, PA-C   Brief Narrative: [name] [name] is a 68 y.o. male with a history of atrial fibrillation with slow ventricular response, CAD, CABG x 4, aortic aneurysm s/p repair, CVA, diabetes mellitus type 2, heart failure with preserved EF. Patient presented secondary to abnormal metabolic panel and while in the emergency department found to have evidence of gross hematuria, anemia and AKI. Urology consulted. Patient started IV fluids. During workup, patient was Hemoccult positive, GI consulted. Hemoglobin continued to drift down requiring 1 unit PRBC via transfusion. CT imaging on admission was significant for right pleural effusion concerning for possible empyema. Pulmonology consulted for chest tube placement which was performed on 11/14 which was removed on 11/17.  Discharge complicated by unsafe discharge plan at this time.   Assessment and Plan:  Gross hematuria Possibly related to foley catheter trauma and complicated by Eliquis  use. Urology consulted. CT abdomen/pelvis significant for normal kidneys without hydronephrosis, nonobstructing stone versus vascular calcification of the upper pole left kidney, no renal mass.  Hematuria catheter was declined by patient.  Per urology, possible hemorrhagic cystitis. Hematuria catheter placed and CBI started on 11/14. Urine culture positive for 20,000 colonies/ml of staphylococcus hemolyticus. Patient completed antibiotics. Bleeding improved. Recommendation for finasteride  on discharge.  Possible UTI Urine culture obtained. Patient started empirically on Ceftriaxone . Urinalysis with rare bacteruria, pyuria and is nitrite positive; significant squamous cells. Urine culture positive for 20,000 colonies/ml of staphylococcus hemolyticus. Patient completed Bactrim .  Acute blood loss anemia Chronic iron  deficiency anemia Baseline hemoglobin  appears to be around 8-9.  Hemoglobin of 7.5 on admission with downtrend to 6.7 likely related to patient's gross hematuria in addition to heme positive stool.  1 unit of PRBC ordered on 11/13 with posttransfusion hemoglobin up to 8.5.  Hemoglobin stable.  AKI Present on admission with creatinine of 1.48 and peak of 1.50. Resolved.  Heme positive stool No history of hematochezia or melena.  BUN significantly elevated on admission, but patient also has evidence of dehydration.  Complicated by Eliquis  use. GI consulted and have recommended inpatient EGD/Colonoscopy. Patient initially declined endoscopic procedures, but eventually agreed. Upper endoscopy performed on 11/23 significant for non-ulcer gastritis (biopsied) and multiple non-bleeding duodenal ulcers (biopsied). Plan for colonoscopy deferred secondary to patient refusing bowel prep. GI signed off. Patient continues to have no melena or hematochezia even with anticoagulation restarted. Hemoglobin has been stable.  Gastric ulcer Duodenal ulcers Noted on upper endoscopy. Pathology consistent with chronic peptic duodenitis (no dysplasia or malignancy) and mild chronic inactive gastritis (no H. Pylori identified, no metaplasia or dyspasia). -GI recommendations: Protonix  40 mg BID x6 weeks  Paroxysmal atrial fibrillation with slow ventricular response Patient is not on rate control medication secondary to slow ventricular response.  He is managed on Eliquis  secondary to a CHA2DS2-VASc of 4.  Eliquis  held secondary to hematuria and associated acute anemia. -Eliquis   Right pleural effusion Noted on CT imaging with evidence of moderate-sized collection.  Concern for possible empyema secondary to gas seen on imaging.  Last thoracentesis was from February 2025, which was also right-sided and was secondary to heart failure.  Patient is completely asymptomatic.  Pulmonology consulted and placed a right-sided chest tube on 11/14 and have applied lytics  for management of loculated effusion. Chest tube successfully removed on 11/17. Antibiotics completed.  Diabetes mellitus type 2 Uncontrolled with hyperglycemia based on last hemoglobin A1c of 8.3%.  CAD S/p  CABG x4.  Aortic atherosclerosis Noted on CT imaging.  Aortic aneurysm Stable.  Chronic HFpEF Currently stable.  Peripheral vascular disease Noted. Not on medication therapy.  Ambulatory dysfunction Noted. PT/OT recommending SNF, however patient is declining at this time.  Left foot wound Noted.  Generalized anxiety disorder Depression -Continue Paxil  and Remeron   Cognitive dysfunction Delirium Noted. History of severe delirium during prior hospitalization. Improved with adjustment of antipsychotic regimen. -Continue Seroquel  50 mg at bedtime   DVT prophylaxis: SCDs, Eliquis  Code Status:   Code Status: Full Code Family Communication: None at bedside. Disposition Plan: Insurance denied SNF authorization. Patient is medically stable for discharge. Discharge pending ongoing TOC discussions   Consultants:  Gastroenterology Urology PCCM Psychiatry  Procedures:  Chest tube placement Upper endoscopy  Antimicrobials: Ceftriaxone     Subjective: No issues noted from overnight events.  Objective: BP 111/66 (BP Location: Right Arm)   Pulse (!) 57   Temp 98.2 F (36.8 C)   Resp 16   Ht 6' 3 (1.905 m)   Wt 68 kg   SpO2 98%   BMI 18.75 kg/m   Examination:  General exam: Appears calm and comfortable. Respiratory system: Respiratory effort normal.    Data Reviewed: I have personally reviewed following labs and imaging studies  CBC Lab Results  Component Value Date   WBC 5.3 11/24/2023   RBC 3.12 (L) 11/24/2023   HGB 8.8 (L) 11/24/2023   HCT 27.3 (L) 11/24/2023   MCV 87.5 11/24/2023   MCH 28.2 11/24/2023   PLT 217 11/24/2023   MCHC 32.2 11/24/2023   RDW 17.1 (H) 11/24/2023   LYMPHSABS 0.8 11/24/2023   MONOABS 0.5 11/24/2023   EOSABS 0.1  11/24/2023   BASOSABS 0.1 11/24/2023     Last metabolic panel Lab Results  Component Value Date   NA 134 (L) 11/25/2023   K 4.2 11/25/2023   CL 101 11/25/2023   CO2 22 11/25/2023   BUN 8 11/25/2023   CREATININE 0.68 11/25/2023   GLUCOSE 126 (H) 11/25/2023   GFRNONAA >60 11/25/2023   GFRAA >60 06/09/2018   CALCIUM  7.9 (L) 11/25/2023   PHOS 3.5 11/24/2023   PROT 6.2 (L) 11/24/2023   ALBUMIN 2.2 (L) 11/24/2023   LABGLOB 3.8 08/26/2023   BILITOT 1.0 11/24/2023   ALKPHOS 132 (H) 11/24/2023   AST 17 11/24/2023   ALT 7 11/24/2023   ANIONGAP 11 11/25/2023    GFR: Estimated Creatinine Clearance: 82.6 mL/min (by C-G formula based on SCr of 0.68 mg/dL).  No results found for this or any previous visit (from the past 240 hours).     Radiology Studies: No results found.     LOS: 26 days    Elgin Lam, MD [facility] 12/09/2023, 10:40 AM   If 7PM-7AM, please contact night-coverage [email or web address]

## 2023-12-09 NOTE — Progress Notes (Addendum)
 Physical Therapy Treatment Patient Details Name: [name] [name] MRN: [id number] DOB: 05-11-1955 Today's Date: 12/09/2023   History of Present Illness 68 y.o. male admitted 11/12/23 for abnormal renal labs. Found to have AKI and potential UTI. Foley catheter was inserted.  He has since been having hematuria, possibly trauma in the setting of Eliquis . Pt with worsening of chronic normocytic anemia. Chest CT showed pleural effusion. Plan for EGD and colonoscopy. PMHx: PAF, chronic A-Fib, CHF, CAD s/p CABG, PAD, T2DM, HTN, HLD, chronic decubitus ulcer,  chronic anemia with heme positive stool, and chronic diarrhea. Of note, recent hospitalization 10/22-11/5 after fall sustaining L SDH with 4mm shift.    PT Comments  PT brought wheelchair in room for goal of transferring pt out of bed and into hallway as pt has not been out of bed in 6 days. Pt initially agreeable and progressing to sitting edge of bed with minimal assist. Pt reporting feeling tired, and dizzy upon sitting edge of bed and requesting to lie back down. Orthostatics positive from supine to sitting (see vitals flow sheet). Pt refusing to transfer to wheelchair due to feeling frightened, despite PT providing extensive education and gait belt and bringing wheelchair up flush to the bed. Pt ultimately still refusing. At this point, he demonstrates significant debility/deconditioning, which will likely worsen with continued refusal to participate in therapies or mobilize out of bed. Will continue efforts.    If plan is discharge home, recommend the following: Assistance with cooking/housework;Assist for transportation;Help with stairs or ramp for entrance;Supervision due to cognitive status;Direct supervision/assist for medications management;A little help with walking and/or transfers;A lot of help with bathing/dressing/bathroom;Direct supervision/assist for financial management   Can travel by private vehicle     No  Equipment  Recommendations  Wheelchair cushion (measurements PT);Wheelchair (measurements PT);Hospital bed;BSC/3in1    Recommendations for Other Services       Precautions / Restrictions Precautions Precautions: Fall Recall of Precautions/Restrictions: Impaired Precaution/Restrictions Comments: low vision. HOH Restrictions Weight Bearing Restrictions Per Provider Order: No     Mobility  Bed Mobility Overal bed mobility: Needs Assistance Bed Mobility: Supine to Sit, Sit to Supine     Supine to sit: Min assist Sit to supine: Contact guard assist   General bed mobility comments: MinA for trunk elevation to sitting and to pull hips out to edge of bed    Transfers                   General transfer comment: refused    Ambulation/Gait                   Stairs             Wheelchair Mobility     Tilt Bed    Modified Rankin (Stroke Patients Only)       Balance Overall balance assessment: Needs assistance Sitting-balance support: Feet supported Sitting balance-Leahy Scale: Fair                                      Hotel Manager: Impaired Factors Affecting Communication: Hearing impaired  Cognition Arousal: Alert Behavior During Therapy: Flat affect   PT - Cognitive impairments: No family/caregiver present to determine baseline                       PT - Cognition Comments: resistant to mobilize Following commands: Impaired Following commands  impaired: Follows one step commands with increased time, Follows one step commands inconsistently    Cueing Cueing Techniques: Verbal cues, Tactile cues, Gestural cues  Exercises      General Comments        Pertinent Vitals/Pain Pain Assessment Pain Assessment: No/denies pain    Home Living                          Prior Function            PT Goals (current goals can now be found in the care plan section) Acute Rehab PT  Goals Patient Stated Goal: to improve his strength and endurance Potential to Achieve Goals: Fair Progress towards PT goals: Not progressing toward goals - comment    Frequency    Min 2X/week      PT Plan      Co-evaluation              AM-PAC PT 6 Clicks Mobility   Outcome Measure  Help needed turning from your back to your side while in a flat bed without using bedrails?: A Little Help needed moving from lying on your back to sitting on the side of a flat bed without using bedrails?: A Little Help needed moving to and from a bed to a chair (including a wheelchair)?: A Lot Help needed standing up from a chair using your arms (e.g., wheelchair or bedside chair)?: A Lot Help needed to walk in hospital room?: Total Help needed climbing 3-5 steps with a railing? : Total 6 Click Score: 12    End of Session   Activity Tolerance: Other (comment) (self limiting) Patient left: in bed;with call bell/phone within reach;with bed alarm set Nurse Communication: Mobility status;Other (comment) (BP) PT Visit Diagnosis: Unsteadiness on feet (R26.81);Other abnormalities of gait and mobility (R26.89);Muscle weakness (generalized) (M62.81);History of falling (Z91.81);Dizziness and giddiness (R42);Difficulty in walking, not elsewhere classified (R26.2)     Time: 1550-1610 PT Time Calculation (min) (ACUTE ONLY): 20 min  Charges:    $Therapeutic Activity: 8-22 mins PT General Charges $$ ACUTE PT VISIT: 1 Visit                     Aleck Daring, PT, DPT Acute Rehabilitation Services Office [phone]    [name] 12/09/2023, 4:30 PM

## 2023-12-10 LAB — GLUCOSE, CAPILLARY
Glucose-Capillary: 243 mg/dL — ABNORMAL HIGH (ref 70–99)
Glucose-Capillary: 212 mg/dL — ABNORMAL HIGH (ref 70–99)
Glucose-Capillary: 219 mg/dL — ABNORMAL HIGH (ref 70–99)
Glucose-Capillary: 245 mg/dL — ABNORMAL HIGH (ref 70–99)

## 2023-12-10 MED ORDER — LIDOCAINE HCL URETHRAL/MUCOSAL 2 % EX GEL
1.0000 | CUTANEOUS | Status: AC | PRN
  Administered 2023-12-10: 1 via URETHRAL
  Filled 2023-12-10: qty 6

## 2023-12-10 NOTE — Inpatient Diabetes Management (Signed)
 Inpatient Diabetes Program Recommendations  AACE/ADA: New Consensus Statement on Inpatient Glycemic Control   Target Ranges:  Prepandial:   less than 140 mg/dL      Peak postprandial:   less than 180 mg/dL (1-2 hours)      Critically ill patients:  140 - 180 mg/dL   Lab Results  Component Value Date   GLUCAP 243 (H) 12/10/2023   HGBA1C 8.3 (H) 11/15/2023    Latest Reference Range & Units 12/09/23 06:14 12/09/23 07:40 12/09/23 11:18 12/09/23 16:13 12/09/23 21:08 12/10/23 07:36  Glucose-Capillary 70 - 99 mg/dL 873 (H) 885 (H) 838 (H) 226 (H) 291 (H) 243 (H)   Review of Glycemic Control  Diabetes history: DM2  Outpatient Diabetes medications: Glipizide  5 mg QAM, 2.5 mg QPM, Januvia  100 mg QD  Current orders for Inpatient glycemic control:  Novolog  0-9 units TID and 0-5 units QHS   Inpatient Diabetes Program Recommendations:     Please consider starting Novolog  3 units TID with meals if he consumes at least 50% of meal.   Thanks,  Lavanda Search, RN, MSN, [facility]  Inpatient Diabetes Coordinator  Pager [phone] (8a-5p)

## 2023-12-10 NOTE — Plan of Care (Signed)
   Problem: Activity: Goal: Risk for activity intolerance will decrease Outcome: Progressing   Problem: Coping: Goal: Level of anxiety will decrease Outcome: Progressing   Problem: Safety: Goal: Ability to remain free from injury will improve Outcome: Progressing

## 2023-12-10 NOTE — TOC Progression Note (Addendum)
 Transition of Care [facility]) - Progression Note    Patient Details  Name: [name] [name] MRN: [id number] Date of Birth: 05-27-1955  Transition of Care [facility]) CM/SW Contact  [name], LCSW Phone Number: 12/10/2023, 1:41 PM  Clinical Narrative:   Messages with [facility] Supervisor [name] and MD: HTA would only reconsider auth for STR if pt lacks capacity and would not be able to leave facility of his own accord.  Pt has been deemed to have capacity, despite the recent delirium.  Pt continues to deny agreement with LTC and can make this decision.     CSW spoke with pt, his mother, and his sister in the room.  [name] not present.  Discussed that appeal was denied for STR.  Efforts to obtain medicaid continue, [name] is meeting with [name]/financial counseling related to medicaid application right now.  Discussed that will have to move forward with DC to home.    CSW spoke with [name] by phone.  He did complete the medicaid application and will be working on spend down moving forward.  CSW discussed with [name] the above information: pt does have capacity to decline LTC with STR being denied.  [name] would like for facility to be found that will take pt with medicaid pending and states pt could be convinced to go without telling him this would be LTC, discussed that CSW cannot do that, [name] upset.    CSW sent additional messages to SNF facilities that had offered STR beds to see if any would accept pt under LOG.  Pt would need to agree to this.    1600: [name] could accept under LOG pending positive result of an administrator, civil service.  They will do the asset search now, should have results by tomorrow.  CSW has received declines for LOG from [name]/[place], [name]/[place], [name]/[place], [name]/[place].  Expected Discharge Plan: Skilled Nursing Facility Barriers to Discharge: Continued Medical Work up               Expected Discharge Plan and Services In-house  Referral: Clinical Social Work   Post Acute Care Choice: Skilled Nursing Facility Living arrangements for the past 2 months: Single Family Home                                       Social Drivers of Health (SDOH) Interventions SDOH Screenings   Food Insecurity: No Food Insecurity (11/13/2023)  Recent Concern: Food Insecurity - Food Insecurity Present (08/18/2023)  Housing: Low Risk  (11/13/2023)  Transportation Needs: No Transportation Needs (11/13/2023)  Utilities: Not At Risk (11/13/2023)  Alcohol Screen: Low Risk  (08/19/2023)  Depression (PHQ2-9): Medium Risk (07/26/2023)  Financial Resource Strain: Low Risk  (08/19/2023)  Physical Activity: Inactive (11/21/2022)  Social Connections: Socially Isolated (11/13/2023)  Stress: Stress Concern Present (11/21/2022)  Tobacco Use: High Risk (11/24/2023)  Health Literacy: Adequate Health Literacy (11/21/2022)    Readmission Risk Interventions    08/20/2023    4:20 PM  Readmission Risk Prevention Plan  Transportation Screening Complete  PCP or Specialist Appt within 3-5 Days Complete  HRI or Home Care Consult Complete  Palliative Care Screening Not Applicable  Medication Review (RN Care Manager) Complete

## 2023-12-10 NOTE — Plan of Care (Signed)
  Problem: Clinical Measurements: Goal: Respiratory complications will improve Outcome: Progressing Goal: Cardiovascular complication will be avoided Outcome: Progressing   Problem: Coping: Goal: Level of anxiety will decrease Outcome: Progressing   

## 2023-12-10 NOTE — Progress Notes (Signed)
 PROGRESS NOTE    [name] [name]  FMW:[id number] DOB: 1955-01-17 DOA: 11/12/2023 PCP: [name] HERO, PA-C   Brief Narrative: [name] [name] is a 68 y.o. male with a history of atrial fibrillation with slow ventricular response, CAD, CABG x 4, aortic aneurysm s/p repair, CVA, diabetes mellitus type 2, heart failure with preserved EF. Patient presented secondary to abnormal metabolic panel and while in the emergency department found to have evidence of gross hematuria, anemia and AKI. Urology consulted. Patient started IV fluids. During workup, patient was Hemoccult positive, GI consulted. Hemoglobin continued to drift down requiring 1 unit PRBC via transfusion. CT imaging on admission was significant for right pleural effusion concerning for possible empyema. Pulmonology consulted for chest tube placement which was performed on 11/14 which was removed on 11/17.  Discharge complicated by unsafe discharge plan at this time.   Assessment and Plan:  Gross hematuria Possibly related to foley catheter trauma and complicated by Eliquis  use. Urology consulted. CT abdomen/pelvis significant for normal kidneys without hydronephrosis, nonobstructing stone versus vascular calcification of the upper pole left kidney, no renal mass.  Hematuria catheter was declined by patient.  Per urology, possible hemorrhagic cystitis. Hematuria catheter placed and CBI started on 11/14. Urine culture positive for 20,000 colonies/ml of staphylococcus hemolyticus. Patient completed antibiotics. Bleeding improved. Recommendation for finasteride  on discharge.  Possible UTI Urine culture obtained. Patient started empirically on Ceftriaxone . Urinalysis with rare bacteruria, pyuria and is nitrite positive; significant squamous cells. Urine culture positive for 20,000 colonies/ml of staphylococcus hemolyticus. Patient completed Bactrim .  Acute blood loss anemia Chronic iron  deficiency anemia Baseline hemoglobin  appears to be around 8-9.  Hemoglobin of 7.5 on admission with downtrend to 6.7 likely related to patient's gross hematuria in addition to heme positive stool.  1 unit of PRBC ordered on 11/13 with posttransfusion hemoglobin up to 8.5.  Hemoglobin stable.  AKI Present on admission with creatinine of 1.48 and peak of 1.50. Resolved.  Heme positive stool No history of hematochezia or melena.  BUN significantly elevated on admission, but patient also has evidence of dehydration.  Complicated by Eliquis  use. GI consulted and have recommended inpatient EGD/Colonoscopy. Patient initially declined endoscopic procedures, but eventually agreed. Upper endoscopy performed on 11/23 significant for non-ulcer gastritis (biopsied) and multiple non-bleeding duodenal ulcers (biopsied). Plan for colonoscopy deferred secondary to patient refusing bowel prep. GI signed off. Patient continues to have no melena or hematochezia even with anticoagulation restarted. Hemoglobin has been stable.  Gastric ulcer Duodenal ulcers Noted on upper endoscopy. Pathology consistent with chronic peptic duodenitis (no dysplasia or malignancy) and mild chronic inactive gastritis (no H. Pylori identified, no metaplasia or dyspasia). -GI recommendations: Protonix  40 mg BID x6 weeks  Paroxysmal atrial fibrillation with slow ventricular response Patient is not on rate control medication secondary to slow ventricular response.  He is managed on Eliquis  secondary to a CHA2DS2-VASc of 4.  Eliquis  held secondary to hematuria and associated acute anemia. -Eliquis   Right pleural effusion Noted on CT imaging with evidence of moderate-sized collection.  Concern for possible empyema secondary to gas seen on imaging.  Last thoracentesis was from February 2025, which was also right-sided and was secondary to heart failure.  Patient is completely asymptomatic.  Pulmonology consulted and placed a right-sided chest tube on 11/14 and have applied lytics  for management of loculated effusion. Chest tube successfully removed on 11/17. Antibiotics completed.  Diabetes mellitus type 2 Uncontrolled with hyperglycemia based on last hemoglobin A1c of 8.3%.  CAD S/p  CABG x4.  Aortic atherosclerosis Noted on CT imaging.  Aortic aneurysm Stable.  Chronic HFpEF Currently stable.  Peripheral vascular disease Noted. Not on medication therapy.  Ambulatory dysfunction Noted. PT/OT recommending SNF, however patient is declining at this time.  Left foot wound Noted.  Generalized anxiety disorder Depression -Continue Paxil  and Remeron   Cognitive dysfunction Delirium Noted. History of severe delirium during prior hospitalization. Improved with adjustment of antipsychotic regimen. -Continue Seroquel  50 mg at bedtime   DVT prophylaxis: SCDs, Eliquis  Code Status:   Code Status: Full Code Family Communication: None at bedside. Disposition Plan: Insurance denied SNF authorization. Patient is medically stable for discharge. Discharge pending ongoing TOC discussions   Consultants:  Gastroenterology Urology PCCM Psychiatry  Procedures:  Chest tube placement Upper endoscopy  Antimicrobials: Ceftriaxone     Subjective: No concerns today. We spoke about his plans for the holidays. He mentions that he needs to go shopping for his son's gift (usually a box of cigars). No other concerns today.  Objective: BP (!) 143/59   Pulse (!) 44   Temp 98.9 F (37.2 C)   Resp 18   Ht 6' 3 (1.905 m)   Wt 68 kg   SpO2 94%   BMI 18.75 kg/m   Examination:  General exam: Appears calm and comfortable. Respiratory system: Clear to auscultation. Respiratory effort normal. Cardiovascular system: S1 & S2 heard, Normal rate with regular rhythm Gastrointestinal system: Abdomen is nondistended, soft and nontender. Normal bowel sounds heard. Central nervous system: Alert and oriented. Musculoskeletal: No edema. No calf tenderness    Data  Reviewed: I have personally reviewed following labs and imaging studies  CBC Lab Results  Component Value Date   WBC 5.3 11/24/2023   RBC 3.12 (L) 11/24/2023   HGB 8.8 (L) 11/24/2023   HCT 27.3 (L) 11/24/2023   MCV 87.5 11/24/2023   MCH 28.2 11/24/2023   PLT 217 11/24/2023   MCHC 32.2 11/24/2023   RDW 17.1 (H) 11/24/2023   LYMPHSABS 0.8 11/24/2023   MONOABS 0.5 11/24/2023   EOSABS 0.1 11/24/2023   BASOSABS 0.1 11/24/2023     Last metabolic panel Lab Results  Component Value Date   NA 134 (L) 11/25/2023   K 4.2 11/25/2023   CL 101 11/25/2023   CO2 22 11/25/2023   BUN 8 11/25/2023   CREATININE 0.68 11/25/2023   GLUCOSE 126 (H) 11/25/2023   GFRNONAA >60 11/25/2023   GFRAA >60 06/09/2018   CALCIUM  7.9 (L) 11/25/2023   PHOS 3.5 11/24/2023   PROT 6.2 (L) 11/24/2023   ALBUMIN 2.2 (L) 11/24/2023   LABGLOB 3.8 08/26/2023   BILITOT 1.0 11/24/2023   ALKPHOS 132 (H) 11/24/2023   AST 17 11/24/2023   ALT 7 11/24/2023   ANIONGAP 11 11/25/2023    GFR: Estimated Creatinine Clearance: 82.6 mL/min (by C-G formula based on SCr of 0.68 mg/dL).  No results found for this or any previous visit (from the past 240 hours).     Radiology Studies: No results found.     LOS: 27 days    Elgin Lam, MD [facility] 12/10/2023, 7:54 AM   If 7PM-7AM, please contact night-coverage [email or web address]

## 2023-12-11 ENCOUNTER — Other Ambulatory Visit: Payer: Self-pay | Admitting: Physician Assistant

## 2023-12-11 DIAGNOSIS — R310 Gross hematuria: Secondary | ICD-10-CM | POA: Diagnosis not present

## 2023-12-11 LAB — BASIC METABOLIC PANEL WITH GFR
Anion gap: 3 — ABNORMAL LOW (ref 5–15)
BUN: 21 mg/dL (ref 8–23)
CO2: 31 mmol/L (ref 22–32)
Calcium: 8 mg/dL — ABNORMAL LOW (ref 8.9–10.3)
Chloride: 101 mmol/L (ref 98–111)
Creatinine, Ser: 0.81 mg/dL (ref 0.61–1.24)
GFR, Estimated: >60 mL/min (ref >60)
Glucose, Bld: 174 mg/dL — ABNORMAL HIGH (ref 70–99)
Potassium: 4.8 mmol/L (ref 3.5–5.1)
Sodium: 135 mmol/L (ref 135–145)

## 2023-12-11 LAB — CBC
HCT: 25.7 % — ABNORMAL LOW (ref 39.0–52.0)
Hemoglobin: 8.2 g/dL — ABNORMAL LOW (ref 13.0–17.0)
MCH: 29.5 pg (ref 26.0–34.0)
MCHC: 31.9 g/dL (ref 30.0–36.0)
MCV: 92.4 fL (ref 80.0–100.0)
Platelets: 213 K/uL (ref 150–400)
RBC: 2.78 MIL/uL — ABNORMAL LOW (ref 4.22–5.81)
RDW: 17.5 % — ABNORMAL HIGH (ref 11.5–15.5)
WBC: 4.5 K/uL (ref 4.0–10.5)
nRBC: 0 % (ref 0.0–0.2)

## 2023-12-11 LAB — GLUCOSE, CAPILLARY
Glucose-Capillary: 174 mg/dL — ABNORMAL HIGH (ref 70–99)
Glucose-Capillary: 155 mg/dL — ABNORMAL HIGH (ref 70–99)
Glucose-Capillary: 129 mg/dL — ABNORMAL HIGH (ref 70–99)
Glucose-Capillary: 176 mg/dL — ABNORMAL HIGH (ref 70–99)

## 2023-12-11 NOTE — Progress Notes (Signed)
 [name]                                                                               [name] [name], is a 68 y.o. male, DOB - January 15, 1955, FMW:[id number] Admit date - 11/12/2023    Outpatient Primary MD for the patient is [name], [name], PA-C  LOS - 28  days    Brief summary   [name] [name] is a 68 y.o. male with a history of atrial fibrillation with slow ventricular response, CAD, CABG x 4, aortic aneurysm s/p repair, CVA, diabetes mellitus type 2, heart failure with preserved EF. Patient presented secondary to abnormal metabolic panel and while in the emergency department found to have evidence of gross hematuria, anemia and AKI. Urology consulted. Patient started IV fluids. During workup, patient was Hemoccult positive, GI consulted. Hemoglobin continued to drift down requiring 1 unit PRBC via transfusion. CT imaging on admission was significant for right pleural effusion concerning for possible empyema. Pulmonology consulted for chest tube placement which was performed on 11/14 which was removed on 11/17.  Discharge complicated by unsafe discharge plan at this time.   Assessment & Plan    Assessment and Plan:  Gross hematuria with possible urinary tract infection. Probably related to Foley catheter trauma and complicated by Eliquis  use. CT abdomen pelvis significant for normal kidneys without hydronephrosis, nonobstructing stone versus vascular calcification of the upper pole left kidney.  Urology consulted and CBI started with resolution of hematuria.  Urine cultures growing 20,000 colonies of Staphylococcus hemolyticus. Patient also completed a course of antibiotics. Recommend finasteride  on discharge.    Acute blood loss anemia superimposed on chronic iron  deficiency anemia. Probably secondary to hematuria. S/p 1 unit of PRBC transfusion and repeat hemoglobin has improved to 8.5   AKI Resolved   Heme positive stools In the setting of  Eliquis  use.  GI consulted and patient underwent EGD showing nonerosive gastritis and multiple nonbleeding duodenal ulcers.  Colonoscopy deferred secondary to patient refusing the bowel prep.    Paroxysmal atrial fibrillation Patient not on rate controlling medications secondary to slow ventricular response. Resume Eliquis .     Type 2 diabetes mellitus Continue with sliding scale insulin . Hemoglobin A1c of 8.3   History of coronary artery disease status post CABG   History of aortic aneurysm which has been stable.   Peripheral vascular disease   Right pleural effusion Noted on CT imaging with evidence of moderate-sized collection.  Concern for possible empyema secondary to gas seen on imaging.  Last thoracentesis was from February 2025, which was also right-sided and was secondary to heart failure.  Patient is completely asymptomatic.  Pulmonology consulted and placed a right-sided chest tube on 11/14 and have applied lytics for management of loculated effusion. Chest tube successfully removed on 11/17. Antibiotics completed.   Cognitive dysfunction Delirium Noted. History of severe delirium during prior hospitalization. Improved with adjustment of antipsychotic regimen. -Continue Seroquel  50 mg at bedtime  RN Pressure Injury Documentation: Wound 09/12/23 1700 Pressure Injury Sacrum Stage 2 -  Partial thickness loss of dermis presenting as a shallow open injury with a red, pink wound bed without slough. (Active)  Estimated body mass index is 18.75 kg/m as calculated from the following:   Height as of this encounter: 6' 3 (1.905 m).   Weight as of this encounter: 68 kg.  Code Status: Full code DVT Prophylaxis:  Place and maintain sequential compression device Start: 11/20/23 1505 SCDs Start: 11/13/23 0733 apixaban  (ELIQUIS ) tablet 5 mg   Level of Care: Level of care: Med-Surg Family Communication: None at bedside  Disposition Plan:     Remains inpatient  appropriate: Pending placement  Procedures:  EGD Chest tube placement  Consultants:   Gastroenterology Urology PCCM Psychiatry  Antimicrobials:   Anti-infectives (From admission, onward)    Start     Dose/Rate Route Frequency Ordered Stop   11/16/23 1315  sulfamethoxazole -trimethoprim  (BACTRIM  DS) 800-160 MG per tablet 1 tablet        1 tablet Oral Every 12 hours 11/16/23 1202 11/22/23 2203   11/14/23 1630  metroNIDAZOLE  (FLAGYL ) IVPB 500 mg  Status:  Discontinued        500 mg 100 mL/hr over 60 Minutes Intravenous Every 8 hours 11/14/23 1534 11/20/23 1606   11/14/23 0200  cefTRIAXone  (ROCEPHIN ) 1 g in sodium chloride  0.9 % 100 mL IVPB  Status:  Discontinued        1 g 200 mL/hr over 30 Minutes Intravenous Every 24 hours 11/13/23 0821 11/20/23 1606   11/13/23 0215  cefTRIAXone  (ROCEPHIN ) 1 g in sodium chloride  0.9 % 100 mL IVPB        1 g 200 mL/hr over 30 Minutes Intravenous  Once 11/13/23 0208 11/13/23 0411        Medications  Scheduled Meds:  apixaban   5 mg Oral BID   aspirin  EC  81 mg Oral Daily   Chlorhexidine  Gluconate Cloth  6 each Topical Daily   collagenase   1 Application Topical Daily   finasteride   5 mg Oral Daily   insulin  aspart  0-5 Units Subcutaneous QHS   insulin  aspart  0-9 Units Subcutaneous TID WC   mirtazapine   15 mg Oral QHS   PARoxetine   20 mg Oral Daily   polyethylene glycol powder  119 g Oral Once   QUEtiapine   50 mg Oral QHS   Continuous Infusions:  sodium chloride  irrigation     PRN Meds:.acetaminophen , bisacodyl , QUEtiapine  **OR** haloperidol  lactate, hydrALAZINE , ondansetron  (ZOFRAN ) IV, zinc  oxide    Subjective:   Kmarion Rawl was seen and examined today.  No pain, no new complaints.   Objective:   Vitals:   12/10/23 2010 12/11/23 0406 12/11/23 0757 12/11/23 1513  BP: (!) 179/80 117/66 128/74 (!) 161/67  Pulse: 62 (!) 57 (!) 49 (!) 42  Resp: 17 18  16   Temp: 98.9 F (37.2 C) 98.1 F (36.7 C) (!) 97.4 F (36.3 C)  98.3 F (36.8 C)  TempSrc: Oral Oral Oral Oral  SpO2: 99% 100% 99% 98%  Weight:      Height:        Intake/Output Summary (Last 24 hours) at 12/11/2023 1557 Last data filed at 12/11/2023 1100 Gross per 24 hour  Intake --  Output 1650 ml  Net -1650 ml   Filed Weights   11/12/23 2154  Weight: 68 kg     Exam General: Alert and oriented x 3, NAD Cardiovascular: S1 S2 auscultated, no murmurs, RRR Respiratory: Clear to auscultation bilaterally, no wheezing, rales or rhonchi Gastrointestinal: Soft, nontender, nondistended, + bowel sounds Ext: no pedal edema bilaterally Neuro: AAOx3,  Skin: No rashes Psych: alert and oriented x3  Data Reviewed:  I have personally reviewed following labs and imaging studies   CBC Lab Results  Component Value Date   WBC 4.5 12/11/2023   RBC 2.78 (L) 12/11/2023   HGB 8.2 (L) 12/11/2023   HCT 25.7 (L) 12/11/2023   MCV 92.4 12/11/2023   MCH 29.5 12/11/2023   PLT 213 12/11/2023   MCHC 31.9 12/11/2023   RDW 17.5 (H) 12/11/2023   LYMPHSABS 0.8 11/24/2023   MONOABS 0.5 11/24/2023   EOSABS 0.1 11/24/2023   BASOSABS 0.1 11/24/2023     Last metabolic panel Lab Results  Component Value Date   NA 135 12/11/2023   K 4.8 12/11/2023   CL 101 12/11/2023   CO2 31 12/11/2023   BUN 21 12/11/2023   CREATININE 0.81 12/11/2023   GLUCOSE 174 (H) 12/11/2023   GFRNONAA >60 12/11/2023   GFRAA >60 06/09/2018   CALCIUM  8.0 (L) 12/11/2023   PHOS 3.5 11/24/2023   PROT 6.2 (L) 11/24/2023   ALBUMIN 2.2 (L) 11/24/2023   LABGLOB 3.8 08/26/2023   BILITOT 1.0 11/24/2023   ALKPHOS 132 (H) 11/24/2023   AST 17 11/24/2023   ALT 7 11/24/2023   ANIONGAP 3 (L) 12/11/2023    CBG (last 3)  Recent Labs    12/10/23 2144 12/11/23 0631 12/11/23 1146  GLUCAP 245* 174* 155*      Coagulation Profile: No results for input(s): INR, PROTIME in the last 168 hours.   Radiology Studies: No results found.     Elgie Butter M.D. [facility] 12/11/2023, 3:57 PM  Available via Epic secure chat 7am-7pm After 7 pm, please refer to night coverage provider listed on amion.

## 2023-12-11 NOTE — Progress Notes (Signed)
 OT Cancellation Note  Patient Details Name: [name] [name] MRN: [id number] DOB: [birth year]   Cancelled Treatment:    Reason Eval/Treat Not Completed: Patient declined, no reason specified, Pt states he did not feel like doing anything, asked not to come back later, declined bed level activities. Will check back as able.   [name] 12/11/2023, 1:14 PM

## 2023-12-11 NOTE — TOC Progression Note (Addendum)
 Transition of Care [facility]) - Progression Note    Patient Details  Name: [name] [name] MRN: [id number] Date of Birth: September 01, 1955  Transition of Care [facility]) CM/SW Contact  [name], LCSW Phone Number: 12/11/2023, 2:06 PM  Clinical Narrative:   Message from [name]/[name]: asset search was unfavorable, they cannot take pt LOG.  He will call pt son and inform him.  CSW spoke with Albino Glasser of [employer]: she would need to talk with pt son about assets and income.  CSW message with Lory Milian: she will review for LOG admit.  1400: TC [name].  Discussed the above.  He will call Brittany.    1500: FF contact with [employer] adult protective services.  CSW updated him.  He will check with careers information officer.  Pt will need to agree to LTC.  [phone].  1630: Message from [facility]  She spoke with [name], they will be able to accept pt LOG/medicaid pending.  Will need the medicaid pending number and the medicaid worker contact information.   Expected Discharge Plan: Skilled Nursing Facility Barriers to Discharge: Continued Medical Work up               Expected Discharge Plan and Services In-house Referral: Clinical Social Work   Post Acute Care Choice: Skilled Nursing Facility Living arrangements for the past 2 months: Single Family Home                                       Social Drivers of Health (SDOH) Interventions SDOH Screenings   Food Insecurity: No Food Insecurity (11/13/2023)  Recent Concern: Food Insecurity - Food Insecurity Present (08/18/2023)  Housing: Low Risk  (11/13/2023)  Transportation Needs: No Transportation Needs (11/13/2023)  Utilities: Not At Risk (11/13/2023)  Alcohol Screen: Low Risk  (08/19/2023)  Depression (PHQ2-9): Medium Risk (07/26/2023)  Financial Resource Strain: Low Risk  (08/19/2023)  Physical Activity: Inactive (11/21/2022)  Social Connections: Socially Isolated (11/13/2023)  Stress: Stress  Concern Present (11/21/2022)  Tobacco Use: High Risk (11/24/2023)  Health Literacy: Adequate Health Literacy (11/21/2022)    Readmission Risk Interventions    08/20/2023    4:20 PM  Readmission Risk Prevention Plan  Transportation Screening Complete  PCP or Specialist Appt within 3-5 Days Complete  HRI or Home Care Consult Complete  Palliative Care Screening Not Applicable  Medication Review (RN Care Manager) Complete

## 2023-12-11 NOTE — Plan of Care (Signed)
   Problem: Clinical Measurements: Goal: Ability to maintain clinical measurements within normal limits will improve Outcome: Progressing Goal: Will remain free from infection Outcome: Progressing

## 2023-12-12 ENCOUNTER — Ambulatory Visit: Admitting: Internal Medicine

## 2023-12-12 LAB — GLUCOSE, CAPILLARY
Glucose-Capillary: 208 mg/dL — ABNORMAL HIGH (ref 70–99)
Glucose-Capillary: 255 mg/dL — ABNORMAL HIGH (ref 70–99)
Glucose-Capillary: 163 mg/dL — ABNORMAL HIGH (ref 70–99)
Glucose-Capillary: 148 mg/dL — ABNORMAL HIGH (ref 70–99)
Glucose-Capillary: 160 mg/dL — ABNORMAL HIGH (ref 70–99)

## 2023-12-12 NOTE — Telephone Encounter (Signed)
°  The original prescription was discontinued on 11/06/2023 by Jonel Lonni SQUIBB, MD for the following reason: Stop Taking at Discharge. Renewing this prescription may not be appropriate.   Last OV: 08/29/2023  Next OV: AW 12/19/2023  Last Refill: 09/18/2023  Dispense: 30/1

## 2023-12-12 NOTE — Progress Notes (Signed)
 [name]                                                                               [name] [name], is a 68 y.o. male, DOB - 1955/12/16, FMW:[id number] Admit date - 11/12/2023    Outpatient Primary MD for the patient is [name], [name] M, PA-C  LOS - 29  days    Brief summary   [name] [name] is a 68 y.o. male with a history of atrial fibrillation with slow ventricular response, CAD, CABG x 4, aortic aneurysm s/p repair, CVA, diabetes mellitus type 2, heart failure with preserved EF. Patient presented secondary to abnormal metabolic panel and while in the emergency department found to have evidence of gross hematuria, anemia and AKI. Urology consulted. Patient started IV fluids. During workup, patient was Hemoccult positive, GI consulted. Hemoglobin continued to drift down requiring 1 unit PRBC via transfusion. CT imaging on admission was significant for right pleural effusion concerning for possible empyema. Pulmonology consulted for chest tube placement which was performed on 11/14 which was removed on 11/17.  Discharge complicated by unsafe discharge plan at this time. No new complaints at this time.   Assessment & Plan    Assessment and Plan:  Gross hematuria with possible urinary tract infection. Probably related to Foley catheter trauma and complicated by Eliquis  use. CT abdomen pelvis significant for normal kidneys without hydronephrosis, nonobstructing stone versus vascular calcification of the upper pole left kidney.  Urology consulted and CBI started with resolution of hematuria.  Urine cultures growing 20,000 colonies of Staphylococcus hemolyticus. Patient also completed a course of antibiotics. Recommend finasteride  on discharge. No new events.     Acute blood loss anemia superimposed on chronic iron  deficiency anemia. Probably secondary to hematuria. S/p 1 unit of PRBC transfusion and repeat hemoglobin has improved to  8.2   AKI Resolved   Heme positive stools In the setting of Eliquis  use.  GI consulted and patient underwent EGD showing nonerosive gastritis and multiple nonbleeding duodenal ulcers.  Colonoscopy deferred secondary to patient refusing the bowel prep. Bowel movements normal.     Paroxysmal atrial fibrillation Patient not on rate controlling medications secondary to slow ventricular response. Resume Eliquis .     Type 2 diabetes mellitus Continue with sliding scale insulin . Hemoglobin A1c of 8.3 CBG (last 3)  Recent Labs    12/12/23 0620 12/12/23 0825 12/12/23 1146  GLUCAP 208* 255* 163*      History of coronary artery disease status post CABG   History of aortic aneurysm which has been stable.   Peripheral vascular disease   Right pleural effusion Noted on CT imaging with evidence of moderate-sized collection.  Concern for possible empyema secondary to gas seen on imaging.  Last thoracentesis was from February 2025, which was also right-sided and was secondary to heart failure.  Patient is completely asymptomatic.  Pulmonology consulted and placed a right-sided chest tube on 11/14 and have applied lytics for management of loculated effusion. Chest tube successfully removed on 11/17. Antibiotics completed.   Cognitive dysfunction Delirium Noted. History of severe delirium during prior hospitalization. Improved with adjustment of antipsychotic regimen. -Continue Seroquel  50 mg at bedtime  RN  Pressure Injury Documentation: Wound 09/12/23 1700 Pressure Injury Sacrum Stage 2 -  Partial thickness loss of dermis presenting as a shallow open injury with a red, pink wound bed without slough. (Active)      Estimated body mass index is 17.06 kg/m as calculated from the following:   Height as of this encounter: 6' 3 (1.905 m).   Weight as of this encounter: 61.9 kg.  Code Status: Full code DVT Prophylaxis:  Place and maintain sequential compression device Start:  11/20/23 1505 SCDs Start: 11/13/23 0733 apixaban  (ELIQUIS ) tablet 5 mg   Level of Care: Level of care: Med-Surg Family Communication: None at bedside  Disposition Plan:     Remains inpatient appropriate: Pending placement  Procedures:  EGD Chest tube placement  Consultants:   Gastroenterology Urology PCCM Psychiatry  Antimicrobials:   Anti-infectives (From admission, onward)    Start     Dose/Rate Route Frequency Ordered Stop   11/16/23 1315  sulfamethoxazole -trimethoprim  (BACTRIM  DS) 800-160 MG per tablet 1 tablet        1 tablet Oral Every 12 hours 11/16/23 1202 11/22/23 2203   11/14/23 1630  metroNIDAZOLE  (FLAGYL ) IVPB 500 mg  Status:  Discontinued        500 mg 100 mL/hr over 60 Minutes Intravenous Every 8 hours 11/14/23 1534 11/20/23 1606   11/14/23 0200  cefTRIAXone  (ROCEPHIN ) 1 g in sodium chloride  0.9 % 100 mL IVPB  Status:  Discontinued        1 g 200 mL/hr over 30 Minutes Intravenous Every 24 hours 11/13/23 0821 11/20/23 1606   11/13/23 0215  cefTRIAXone  (ROCEPHIN ) 1 g in sodium chloride  0.9 % 100 mL IVPB        1 g 200 mL/hr over 30 Minutes Intravenous  Once 11/13/23 0208 11/13/23 0411        Medications  Scheduled Meds:  apixaban   5 mg Oral BID   aspirin  EC  81 mg Oral Daily   Chlorhexidine  Gluconate Cloth  6 each Topical Daily   collagenase   1 Application Topical Daily   finasteride   5 mg Oral Daily   insulin  aspart  0-5 Units Subcutaneous QHS   insulin  aspart  0-9 Units Subcutaneous TID WC   mirtazapine   15 mg Oral QHS   PARoxetine   20 mg Oral Daily   polyethylene glycol powder  119 g Oral Once   QUEtiapine   50 mg Oral QHS   Continuous Infusions:  sodium chloride  irrigation     PRN Meds:.acetaminophen , bisacodyl , QUEtiapine  **OR** haloperidol  lactate, hydrALAZINE , ondansetron  (ZOFRAN ) IV, zinc  oxide    Subjective:   [name] [name] was seen and examined today.  No new events today.   Objective:   Vitals:   12/11/23 2002 12/12/23 0308  12/12/23 0757 12/12/23 1427  BP: (!) 171/87 136/76 134/74 (!) 153/74  Pulse: (!) 56 (!) 53 (!) 41 (!) 42  Resp: 17 17 16 16   Temp: 98.8 F (37.1 C) 97.6 F (36.4 C) 98.4 F (36.9 C) 98.3 F (36.8 C)  TempSrc: Oral Oral  Oral  SpO2: 97% 97% 99% 100%  Weight:      Height:        Intake/Output Summary (Last 24 hours) at 12/12/2023 1608 Last data filed at 12/12/2023 9167 Gross per 24 hour  Intake 120 ml  Output 600 ml  Net -480 ml   Filed Weights   11/12/23 2154 12/11/23 1713  Weight: 68 kg 61.9 kg     Exam General exam: Appears calm and comfortable  Respiratory system: Clear to auscultation. Respiratory effort normal. Cardiovascular system: S1 & S2 heard, RRR. No JVD,  Gastrointestinal system: Abdomen is nondistended, soft and nontender.  Central nervous system: Alert and oriented.  Extremities: Symmetric 5 x 5 power. Skin: No rashes,  Psychiatry: Mood & affect appropriate.    Data Reviewed:  I have personally reviewed following labs and imaging studies   CBC Lab Results  Component Value Date   WBC 4.5 12/11/2023   RBC 2.78 (L) 12/11/2023   HGB 8.2 (L) 12/11/2023   HCT 25.7 (L) 12/11/2023   MCV 92.4 12/11/2023   MCH 29.5 12/11/2023   PLT 213 12/11/2023   MCHC 31.9 12/11/2023   RDW 17.5 (H) 12/11/2023   LYMPHSABS 0.8 11/24/2023   MONOABS 0.5 11/24/2023   EOSABS 0.1 11/24/2023   BASOSABS 0.1 11/24/2023     Last metabolic panel Lab Results  Component Value Date   NA 135 12/11/2023   K 4.8 12/11/2023   CL 101 12/11/2023   CO2 31 12/11/2023   BUN 21 12/11/2023   CREATININE 0.81 12/11/2023   GLUCOSE 174 (H) 12/11/2023   GFRNONAA >60 12/11/2023   GFRAA >60 06/09/2018   CALCIUM  8.0 (L) 12/11/2023   PHOS 3.5 11/24/2023   PROT 6.2 (L) 11/24/2023   ALBUMIN 2.2 (L) 11/24/2023   LABGLOB 3.8 08/26/2023   BILITOT 1.0 11/24/2023   ALKPHOS 132 (H) 11/24/2023   AST 17 11/24/2023   ALT 7 11/24/2023   ANIONGAP 3 (L) 12/11/2023    CBG (last 3)  Recent  Labs    12/12/23 0620 12/12/23 0825 12/12/23 1146  GLUCAP 208* 255* 163*      Coagulation Profile: No results for input(s): INR, PROTIME in the last 168 hours.   Radiology Studies: No results found.     Elgie Butter M.D. [name] 12/12/2023, 4:08 PM  Available via Epic secure chat 7am-7pm After 7 pm, please refer to night coverage provider listed on amion.

## 2023-12-12 NOTE — Plan of Care (Signed)
   Problem: Education: Goal: Knowledge of General Education information will improve Description: Including pain rating scale, medication(s)/side effects and non-pharmacologic comfort measures Outcome: Progressing   Problem: Activity: Goal: Risk for activity intolerance will decrease Outcome: Progressing   Problem: Coping: Goal: Level of anxiety will decrease Outcome: Progressing   Problem: Skin Integrity: Goal: Risk for impaired skin integrity will decrease Outcome: Progressing

## 2023-12-12 NOTE — Plan of Care (Signed)
 Palliative:  PMT has reviewed chart. Previous goals of care discussions were had - please refer to previous notes - patient consistently requests full code/full scope care.   Son requested palliative involvement to review seriousness of situation with patient and this was done; patient's participation in conversation limited.   Upon chart review, appears this remains a disposition issue.  PMT will sign off.   Please reach out with any palliative related needs.  Tobey Jama Barnacle, DNP, AGNP-C Palliative Medicine Team Team Phone # [phone]  Pager # [phone]

## 2023-12-12 NOTE — TOC Progression Note (Signed)
 Transition of Care [facility]) - Progression Note    Patient Details  Name: [name] [name] MRN: [id number] Date of Birth: 02/24/1955  Transition of Care [facility]) CM/SW Contact  [name], LCSW Phone Number: 12/12/2023, 3:53 PM  Clinical Narrative:   CSW left message with [employer]/[facility]: requested medicaid pending number and medicaid CSW contact information.  CSW updated [name].    Expected Discharge Plan: Skilled Nursing Facility Barriers to Discharge: Continued Medical Work up               Expected Discharge Plan and Services In-house Referral: Clinical Social Work   Post Acute Care Choice: Skilled Nursing Facility Living arrangements for the past 2 months: Single Family Home                                       Social Drivers of Health (SDOH) Interventions SDOH Screenings   Food Insecurity: No Food Insecurity (11/13/2023)  Recent Concern: Food Insecurity - Food Insecurity Present (08/18/2023)  Housing: Low Risk (11/13/2023)  Transportation Needs: No Transportation Needs (11/13/2023)  Utilities: Not At Risk (11/13/2023)  Alcohol Screen: Low Risk (08/19/2023)  Depression (PHQ2-9): Medium Risk (07/26/2023)  Financial Resource Strain: Low Risk (08/19/2023)  Physical Activity: Inactive (11/21/2022)  Social Connections: Socially Isolated (11/13/2023)  Stress: Stress Concern Present (11/21/2022)  Tobacco Use: High Risk (11/24/2023)  Health Literacy: Adequate Health Literacy (11/21/2022)    Readmission Risk Interventions    08/20/2023    4:20 PM  Readmission Risk Prevention Plan  Transportation Screening Complete  PCP or Specialist Appt within 3-5 Days Complete  HRI or Home Care Consult Complete  Palliative Care Screening Not Applicable  Medication Review (RN Care Manager) Complete

## 2023-12-12 NOTE — Progress Notes (Signed)
 OT Cancellation Note  Patient Details Name: Delvis Kau MRN: [id number] DOB: [birth year]   Cancelled Treatment:    Reason Eval/Treat Not Completed: Patient declined, no reason specified Pt initially reporting legs were healed and able to walk without a device. When prompted to try this OOB activity, pt repeatedly declined despite education and encouragement. Educated on department policy to sign off after repeated refusals. If pt refuses OT session again, would warrant sign off.   Mliss Fish 12/12/2023, 9:56 AM

## 2023-12-12 NOTE — Progress Notes (Deleted)
 Name: [name] [name]  Age/ Sex: 68 y.o., male   MRN/ DOB: [id number], 02-28-1955     PCP: [name] HERO, PA-C   Reason for Endocrinology Evaluation: Type 2 Diabetes Mellitus  Initial Endocrine Consultative Visit: 07/15/2020    PATIENT IDENTIFIER: [name] [name] is a 68 y.o. male with a past medical history of DM, CAD, HTN, PVD, PAF. The patient has followed with Endocrinology clinic since 07/15/2020 for consultative assistance with management of his diabetes.  DIABETIC HISTORY:  [name] [name] was diagnosed with DM in 2011, and started insulin  therapy in 2013 through his previous endocrinologist. His hemoglobin A1c has ranged from 9.2% in 2023, peaking at >15.0% in 2022.  Saw Dr. Kassie last in 04/2021    Insulin  pump was discontinued during hospitalization 11/2022 and was deemed not fit due to noncompliance.  He was discharged on metformin and Januvia   By 01/2023 I discontinued metformin and started him on glipizide  and increase Januvia   Januvia  was discontinued during hospitalization in September, 2025 with AKI   SUBJECTIVE:   During the last visit (08/08/2023): A1c 9.1%      Today (12/12/2023): [name] [name] is here for a follow up on diabetes management.  He checks his blood sugars multiple  times daily.      Since his last visit here, he has been hospitalized multiple times.  In August, 2025 for CHF with bilateral effusions.  In September for AKI.  Diuretics held.  Was advised not to use Januvia  He was also diagnosed with subdural hematoma in November, 2025.  Eliquis  was held temporarily but has been restarted once stability has been improved on CT imaging He presented again in November 2 the ED with gross hematuria, patient has chronic Foley.  He was noted with positive Hemoccult testing, GI was consulted  Patient is s/p EGD November, 2025 biopsies from the small intestine demonstrated chronic peptic duodenitis but no evidence of dyspepsia, infection, or  worrisome features.  Biopsies from the stomach indicated mild, chronic, inactive gastritis Historically he has had chronic diarrhea.  Imodium  was discontinued during hospitalization He had a follow-up with neurology for history of CVA He continues to follow-up with cardiology for CAD, CHF, and A-fib He was also evaluated for peripheral vascular disease, no intervention was recommended No nausea or vomiting   HOME DIABETES REGIMEN:  Januvia  100 mg daily  Glipizide  5 mg, twice daily    Statin: yes ACE-I/ARB: yes    CONTINUOUS GLUCOSE MONITORING RECORD INTERPRETATION    Dates of Recording: 7/25-08/08/2023  Sensor description:dexcom  Results statistics:   CGM use % of time 91  Average and SD 188/46  Time in range 43%  % Time Above 180 48  % Time above 250 9  % Time Below target 0      Glycemic patterns summary: BG's are slightly elevated overnight, but increase more throughout the day  Hyperglycemic episodes mostly during the day with meals  Hypoglycemic episodes occurred N/A  Overnight periods: Slightly above goal    DIABETIC COMPLICATIONS: Microvascular complications:  Retinopathy B/L , legally blind in left eye  Denies: CKD Last Eye Exam: Completed 05/11/2022  Macrovascular complications:  CAD (S/P CABG) Denies: CVA,   HISTORY:  Past Medical History:  Past Medical History:  Diagnosis Date   Acute urinary retention 01/04/2022   Altered mental status 08/18/2023   Anxiety    Arthritis    Cataract    CHF (congestive heart failure) (HCC)    Constipation    pt  states goes EOD- hard stools    Coronary artery disease    Depression    Diabetes mellitus without complication (HCC)    Diabetic retinopathy (HCC)    DKA (diabetic ketoacidoses)    Hyperlipidemia    Hypertension    off meds   Hypertensive retinopathy    Lesion of ulnar nerve, left upper limb 07/12/2017   Non-intractable vomiting without nausea 06/10/2018   Postherpetic neuralgia 08/11/2021    Speculative diagnosis   Psoriasis    Tubular adenoma of colon 03/2019   UTI (urinary tract infection) 01/05/2022   Past Surgical History:  Past Surgical History:  Procedure Laterality Date   ABDOMINAL AORTOGRAM W/LOWER EXTREMITY N/A 08/30/2020   Procedure: ABDOMINAL AORTOGRAM W/LOWER EXTREMITY;  Surgeon: Serene Gaile ORN, MD;  Location: [facility];  Service: Cardiovascular;  Laterality: N/A;   ABDOMINAL AORTOGRAM W/LOWER EXTREMITY N/A 11/15/2020   Procedure: ABDOMINAL AORTOGRAM W/LOWER EXTREMITY;  Surgeon: Serene Gaile ORN, MD;  Location: [facility];  Service: Cardiovascular;  Laterality: N/A;   ANKLE SURGERY     right   CARDIAC CATHETERIZATION  09/25/2001   COLONOSCOPY     ~10 yr ago- normal  per pt    COLONOSCOPY N/A 11/24/2023   Procedure: ABORTED COLONOSCOPY;  Surgeon: San Sandor GAILS, DO;  Location: [facility];  Service: Gastroenterology;  Laterality: N/A;   CORONARY ARTERY BYPASS GRAFT  09/30/2001   CABG x 4   ESOPHAGOGASTRODUODENOSCOPY N/A 11/24/2023   Procedure: EGD (ESOPHAGOGASTRODUODENOSCOPY);  Surgeon: San Sandor GAILS, DO;  Location: [facility];  Service: Gastroenterology;  Laterality: N/A;   IR THORACENTESIS ASP PLEURAL SPACE W/IMG GUIDE  05/19/2020   IR THORACENTESIS ASP PLEURAL SPACE W/IMG GUIDE  02/04/2023   LAPAROSCOPIC INGUINAL HERNIA REPAIR Bilateral 02/09/2010   MASS EXCISION Left 03/12/2013   Procedure: LEFT INDEX EXCISION MASS AND DEBRIDMENT DISTAL INTERPHALANGEAL JOINT;  Surgeon: Franky JONELLE Curia, MD;  Location: [facility];  Service: Orthopedics;  Laterality: Left;   PERIPHERAL VASCULAR BALLOON ANGIOPLASTY Left 08/30/2020   Procedure: PERIPHERAL VASCULAR BALLOON ANGIOPLASTY;  Surgeon: Serene Gaile ORN, MD;  Location: [facility];  Service: Cardiovascular;  Laterality: Left;  Peroneal   TRANSESOPHAGEAL ECHOCARDIOGRAM (CATH LAB) N/A 11/26/2022   Procedure: TRANSESOPHAGEAL ECHOCARDIOGRAM;  Surgeon: Kate Lonni CROME, MD;   Location: [facility] INVASIVE CV [facility];  Service: Cardiovascular;  Laterality: N/A;   Social History:  reports that he has been smoking cigars. He has never used smokeless tobacco. He reports that he does not currently use alcohol. He reports that he does not use drugs. Family History:  Family History  Problem Relation Age of Onset   Lymphoma Father    Cancer Paternal Grandmother        Colon Cancer   Colon cancer Paternal Grandmother    Colon polyps Neg Hx    Esophageal cancer Neg Hx    Rectal cancer Neg Hx    Stomach cancer Neg Hx    Diabetes Mellitus I Neg Hx    Pancreatic cancer Neg Hx    Stroke Neg Hx    Dementia Neg Hx      HOME MEDICATIONS: Allergies as of 12/12/2023       Reactions   Other Hives, Itching   Wool fabric         Medication List      Notice   This visit is during an admission. Changes to the med list made in this visit will be reflected in the After Visit Summary of  the admission.      OBJECTIVE:   Vital Signs: There were no vitals taken for this visit.  Wt Readings from Last 3 Encounters:  12/11/23 136 lb 7.4 oz (61.9 kg)  11/05/23 146 lb 3.2 oz (66.3 kg)  09/30/23 146 lb (66.2 kg)     Exam: General: Pt appears well and is in NAD  Lungs: Clear with good BS bilat   Heart: RRR   Neuro: MS is good with appropriate affect, pt is alert and Ox3   Dm Foot Exam 09/05/2022 per podiatry      DATA REVIEWED:  Lab Results  Component Value Date   HGBA1C 8.3 (H) 11/15/2023   HGBA1C 9.1 (H) 07/26/2023   HGBA1C 7.8 (H) 02/03/2023    Latest Reference Range & Units 12/11/23 04:41  Sodium 135 - 145 mmol/L 135  Potassium 3.5 - 5.1 mmol/L 4.8  Chloride 98 - 111 mmol/L 101  CO2 22 - 32 mmol/L 31  Glucose 70 - 99 mg/dL 825 (H)  BUN 8 - 23 mg/dL 21  Creatinine 9.38 - 8.75 mg/dL 9.18  Calcium  8.9 - 10.3 mg/dL 8.0 (L)  Anion gap 5 - 15  3 (L)  GFR, Estimated >60 mL/min >60     Old records , labs and images have been reviewed.    ASSESSMENT / PLAN /  RECOMMENDATIONS:   1) Type 2 Diabetes Mellitus, Sub-optimally controlled, With retinopathic , neuropathic, retinopathic and macrovascular  complications - Most recent A1c of 9.1 %. Goal A1c < 7.0 %.     - A1c has trended up from 7.9% to 9.1% - Patient was having hypoglycemic episodes while on glipizide  10 mg and decreased appetite, we decreased glipizide  to half a tablet before breakfast - We discussed the importance of holding glipizide  if appetite is low to prevent hypoglycemia, which is difficult as the patient lives alone , and son is able to manage his medications but will create hardship to try to monitor intake and adjust glipizide  accordingly , but it appears that he recently his appetite has increased which resulted in hyperglycemia - I will change his prescription of glipizide  from 10 mg to 5 mg - We entertain the idea of switching to XL formulation if unable to take glipizide  twice daily but the need arises for this.  MEDICATIONS: Decrease  glipizide  5 mg daily Continue Januvia  100 mg daily    EDUCATION / INSTRUCTIONS: BG monitoring instructions: Patient is instructed to check his blood sugars 4 times a day, before each meal and bedtime. Call [facility] if: BG persistently < 70  I reviewed the Rule of 15 for the treatment of hypoglycemia in detail with the patient. Literature supplied.     2) Diabetic complications:  Eye: Does  have known diabetic retinopathy.  Neuro/ Feet: Does have known diabetic peripheral neuropathy .  Renal: Patient does not have known baseline CKD. He   is  on an ACEI/ARB at present.    Follow-up in 4 months   Signed electronically by: Stefano Redgie Butts, MD  [facility] Endocrinology  [facility] [address] [place], [place] [address] Phone: [phone] FAX: [phone]   CC: [name], Mardy HERO, PA-C [address] [place] [address] Phone: [phone]  Fax:  [phone]  Return to Endocrinology clinic as below: Future Appointments  Date Time Provider Department Center  12/12/2023  7:30 AM Raymonde Hamblin, Donell Redgie, MD [facility] None  12/19/2023  9:20 AM [facility] ANNUAL WELLNESS VISIT 1 [facility]  Willo Milian  08/03/2024  2:00 PM Rosemarie Eather RAMAN, MD [facility] None

## 2023-12-12 NOTE — Progress Notes (Signed)
 Physical Therapy Treatment Patient Details Name: [name] [name] MRN: [id number] DOB: [birth year] Today's Date: 12/12/2023   History of Present Illness 68 y.o. male admitted 11/12/23 for abnormal renal labs. Found to have AKI and potential UTI. Foley catheter was inserted.  He has since been having hematuria, possibly trauma in the setting of Eliquis . Pt with worsening of chronic normocytic anemia. During workup, patient was Hemoccult positive, GI consulted. Hemoglobin continued to drift down requiring 1 unit PRBC via transfusion. CT imaging on admission was significant for right pleural effusion concerning for possible empyema. Pulmonology consulted for chest tube placement which was performed on 11/14 which was removed on 11/17.  Discharge complicated by unsafe discharge plan at this time. PMHx: PAF, chronic A-Fib, CHF, CAD s/p CABG, PAD, T2DM, HTN, HLD, chronic decubitus ulcer,  chronic anemia with heme positive stool, and chronic diarrhea. Of note, recent hospitalization 10/22-11/5 after fall sustaining L SDH with 4mm shift.    PT Comments  Pt received in supine, alert and with flat affect, pt reluctant to participate, poor insight into his deficits. Pt ultimately tolerated maxi-move lift OOB to wheelchair, instruction on some wheelchair parts/safety, and step pivot transfer back from chair to bed. Pt reports severe fatigue (~8/10 modified RPE) post-exertion. Limited participation in wheelchair mobility instruction, pt mostly totalA for wheelchair mobility this date due to decreased motivation/participation. RN notified pt would benefit from air bed unless staff is able to roll him Q2H for pressure relief. Patient will benefit from continued inpatient follow up therapy, <3 hours/day.    If plan is discharge home, recommend the following: Assistance with cooking/housework;Assist for transportation;Help with stairs or ramp for entrance;Supervision due to cognitive status;Direct supervision/assist for  medications management;A lot of help with bathing/dressing/bathroom;Direct supervision/assist for financial management;Two people to help with walking and/or transfers   Can travel by private vehicle     No  Equipment Recommendations  Wheelchair cushion (measurements PT);Wheelchair (measurements PT);Hospital bed;BSC/3in1;Hoyer lift    Recommendations for Other Services       Precautions / Restrictions Precautions Precautions: Fall Recall of Precautions/Restrictions: Impaired Precaution/Restrictions Comments: low vision. HOH. hx TBI Restrictions Weight Bearing Restrictions Per Provider Order: No     Mobility  Bed Mobility Overal bed mobility: Needs Assistance Bed Mobility: Rolling Rolling: Min assist, Mod assist, Used rails     Sit to supine: Min assist, Used rails   General bed mobility comments: MinA to roll to each side, modA to maintain sidelying while hoyer pad moved beneath each side.    Transfers Overall transfer level: Needs assistance Equipment used: 2 person hand held assist Transfers: Sit to/from Stand, Bed to chair/wheelchair/BSC Sit to Stand: Min assist, +2 physical assistance   Step pivot transfers: Mod assist, +2 physical assistance       General transfer comment: Pt refused stand pivot to chair initially, so PTA brought maxi-move and pt lifted to WC from bed via maxi-move. Pt then agreeable to perform step pivot to get back to bed from the chair. Transfer via Lift Equipment: Maximove  Ambulation/Gait               General Gait Details: pt c/o severe fatigue after step pivot from chair to bed.   Stairs             [employer] mobility: Yes Wheelchair propulsion: Both upper extremities Wheelchair parts: Needs assistance Distance: 4 Wheelchair Assistance Details (indicate cue type and reason): Once up in [facility], pt refusing to attempt wheeling into hallway,  so PTA provided totalA wheelchair mobility  into hallway to see if pt able to follow cues for environmental scanning/awareness. Pt able to participate somewhat in counting objects (small tree decorations) on desk at nursing station, but not correctly counting. Pt then pushed back to room, and once in room he was able to wheel ~49ft closer to bed using wheel rails to propel, but refused to attempt turning chair for better angle. Pt shown how to use side brakes on chair, but not able to lock chair on the L side.   Tilt Bed    Modified Rankin (Stroke Patients Only)       Balance Overall balance assessment: Needs assistance Sitting-balance support: Feet supported Sitting balance-Leahy Scale: Poor Sitting balance - Comments: posterior lean in wheelchiar, only able to tolerate sitting forward in chair a few seconds away from back rest before impulsively leaning back again. Postural control: Posterior lean Standing balance support: Bilateral upper extremity supported, During functional activity, Reliant on assistive device for balance Standing balance-Leahy Scale: Poor Standing balance comment: +2 modA HHA for static and dynamic standing                            Communication Communication Communication: Impaired Factors Affecting Communication: Hearing impaired  Cognition Arousal: Alert Behavior During Therapy: Flat affect   PT - Cognitive impairments: No family/caregiver present to determine baseline                       PT - Cognition Comments: Pt resistant to mobilize, poor insight into deficits, pt apologizes for being grumpy at end of session. Pt seemed to do better today with binary choices for therapy tasks (assumed compliance and would you rather get to wheelchair and back to bed or get to regular chair?), rather than yes/no options. Following commands: Impaired Following commands impaired: Follows one step commands with increased time, Follows one step commands inconsistently    Cueing Cueing  Techniques: Verbal cues, Tactile cues, Gestural cues  Exercises General Exercises - Lower Extremity Long Arc Quad: AAROM, Both, 10 reps, Seated    General Comments General comments (skin integrity, edema, etc.): pt with bowel incontinence but unaware, provided with TotalA for peri-care in standing, pt fatigues after ~ 1 min standing, had to sit down on bed. RN notified pt with loose stools and will need more thorough bed bath in a few minutes as loose stools still coming after clean-up. Clean pad on bed under him and dirty pad from chair placed in laundry.      Pertinent Vitals/Pain Pain Assessment Pain Assessment: PAINAD Breathing: normal Negative Vocalization: occasional moan/groan, low speech, negative/disapproving quality Facial Expression: smiling or inexpressive Body Language: relaxed Consolability: no need to console PAINAD Score: 1 Pain Descriptors / Indicators: Grimacing Pain Intervention(s): Limited activity within patient's tolerance, Monitored during session, Repositioned    Home Living                          Prior Function            PT Goals (current goals can now be found in the care plan section) Acute Rehab PT Goals Patient Stated Goal: to improve his strength and endurance PT Goal Formulation: With patient Time For Goal Achievement: 12/12/23 Progress towards PT goals: Progressing toward goals (slowly)    Frequency    Min 2X/week      PT Plan  Co-evaluation              AM-PAC PT 6 Clicks Mobility   Outcome Measure  Help needed turning from your back to your side while in a flat bed without using bedrails?: A Little Help needed moving from lying on your back to sitting on the side of a flat bed without using bedrails?: A Lot Help needed moving to and from a bed to a chair (including a wheelchair)?: A Lot Help needed standing up from a chair using your arms (e.g., wheelchair or bedside chair)?: A Lot Help needed to walk in  hospital room?: Total Help needed climbing 3-5 steps with a railing? : Total 6 Click Score: 11    End of Session Equipment Utilized During Treatment: Gait belt Activity Tolerance: Other (comment);Patient limited by fatigue (self limiting) Patient left: in bed;with call bell/phone within reach;with bed alarm set;Other (comment) (pt with heel protectors donned, SCD on legs but machine not working properly, [facility] notified.) Nurse Communication: Mobility status;Other (comment);Need for lift equipment (pt needs air bed probably unless he is being rolled for pressure relief Q2H; needs bed bath, PTA assisted with peri-care but BM ongoing) PT Visit Diagnosis: Unsteadiness on feet (R26.81);Other abnormalities of gait and mobility (R26.89);Muscle weakness (generalized) (M62.81);History of falling (Z91.81);Dizziness and giddiness (R42);Difficulty in walking, not elsewhere classified (R26.2)     Time: 8452-8369 PT Time Calculation (min) (ACUTE ONLY): 43 min  Charges:    $Therapeutic Activity: 23-37 mins $Wheel Chair Management: 8-22 mins PT General Charges $$ ACUTE PT VISIT: 1 Visit                     Shinika Estelle P., PTA Acute Rehabilitation Services Secure Chat Preferred 9a-5:30pm Office: [phone]    Connell HERO [facility] 12/12/2023, 5:30 PM

## 2023-12-13 LAB — GLUCOSE, CAPILLARY
Glucose-Capillary: 172 mg/dL — ABNORMAL HIGH (ref 70–99)
Glucose-Capillary: 170 mg/dL — ABNORMAL HIGH (ref 70–99)
Glucose-Capillary: 237 mg/dL — ABNORMAL HIGH (ref 70–99)
Glucose-Capillary: 224 mg/dL — ABNORMAL HIGH (ref 70–99)

## 2023-12-13 MED ORDER — GLUCERNA SHAKE PO LIQD
237.0000 mL | Freq: Two times a day (BID) | ORAL | Status: DC
  Administered 2023-12-13: 237 mL via ORAL

## 2023-12-13 NOTE — Plan of Care (Signed)
   Problem: Activity: Goal: Risk for activity intolerance will decrease Outcome: Progressing   Problem: Safety: Goal: Ability to remain free from injury will improve Outcome: Progressing   Problem: Skin Integrity: Goal: Risk for impaired skin integrity will decrease Outcome: Progressing

## 2023-12-13 NOTE — Progress Notes (Signed)
 [name]                                                                               [name], is a 68 y.o. male, DOB - 04-27-1955, FMW:[id number] Admit date - 11/12/2023    Outpatient Primary MD for the patient is [name], [name] M, PA-C  LOS - 30  days    Brief summary   [name] [name] is a 68 y.o. male with a history of atrial fibrillation with slow ventricular response, CAD, CABG x 4, aortic aneurysm s/p repair, CVA, diabetes mellitus type 2, heart failure with preserved EF. Patient presented secondary to abnormal metabolic panel and while in the emergency department found to have evidence of gross hematuria, anemia and AKI. Urology consulted. Patient started IV fluids. During workup, patient was Hemoccult positive, GI consulted. Hemoglobin continued to drift down requiring 1 unit PRBC via transfusion. CT imaging on admission was significant for right pleural effusion concerning for possible empyema. Pulmonology consulted for chest tube placement which was performed on 11/14 which was removed on 11/17.  Discharge complicated by unsafe discharge plan at this time. No new complaints at this time. Patient's son concerned about his lack of appetite.   Assessment & Plan    Assessment and Plan:  Gross hematuria with possible urinary tract infection. Probably related to Foley catheter trauma and complicated by Eliquis  use. CT abdomen pelvis significant for normal kidneys without hydronephrosis, nonobstructing stone versus vascular calcification of the upper pole left kidney.  Urology consulted and CBI started with resolution of hematuria.  Urine cultures growing 20,000 colonies of Staphylococcus hemolyticus. Patient also completed a course of antibiotics. Recommend finasteride  on discharge. No new events.     Acute blood loss anemia superimposed on chronic iron  deficiency anemia. Probably secondary to hematuria. S/p 1 unit of PRBC transfusion and repeat  hemoglobin has improved to 8.2   AKI Resolved   Heme positive stools In the setting of Eliquis  use.  GI consulted and patient underwent EGD showing nonerosive gastritis and multiple nonbleeding duodenal ulcers.  Colonoscopy deferred secondary to patient refusing the bowel prep. Bowel movements normal.     Paroxysmal atrial fibrillation Patient not on rate controlling medications secondary to slow ventricular response. Resume Eliquis .     Type 2 diabetes mellitus Continue with sliding scale insulin . Hemoglobin A1c of 8.3 CBG (last 3)  Recent Labs    12/12/23 2115 12/13/23 0613 12/13/23 1123  GLUCAP 160* 172* 170*   No changes in meds.    History of coronary artery disease status post CABG   History of aortic aneurysm which has been stable.   Peripheral vascular disease   Right pleural effusion Noted on CT imaging with evidence of moderate-sized collection.  Concern for possible empyema secondary to gas seen on imaging.  Last thoracentesis was from February 2025, which was also right-sided and was secondary to heart failure.  Patient is completely asymptomatic.  Pulmonology consulted and placed a right-sided chest tube on 11/14 and have applied lytics for management of loculated effusion. Chest tube successfully removed on 11/17. Antibiotics completed.   Cognitive dysfunction Delirium Noted. History of severe delirium during prior hospitalization. Improved with  adjustment of antipsychotic regimen. -Continue Seroquel  50 mg at bedtime   Poor appetite Will request nutritionist consult in am.   RN Pressure Injury Documentation: Wound 09/12/23 1700 Pressure Injury Sacrum Stage 2 -  Partial thickness loss of dermis presenting as a shallow open injury with a red, pink wound bed without slough. (Active)      Estimated body mass index is 17.06 kg/m as calculated from the following:   Height as of this encounter: 6' 3 (1.905 m).   Weight as of this encounter: 61.9  kg.  Code Status: Full code DVT Prophylaxis:  Place and maintain sequential compression device Start: 11/20/23 1505 SCDs Start: 11/13/23 0733 apixaban  (ELIQUIS ) tablet 5 mg   Level of Care: Level of care: Med-Surg Family Communication: None at bedside  Disposition Plan:     Remains inpatient appropriate: Pending placement  Procedures:  EGD Chest tube placement  Consultants:   Gastroenterology Urology PCCM Psychiatry  Antimicrobials:   Anti-infectives (From admission, onward)    Start     Dose/Rate Route Frequency Ordered Stop   11/16/23 1315  sulfamethoxazole -trimethoprim  (BACTRIM  DS) 800-160 MG per tablet 1 tablet        1 tablet Oral Every 12 hours 11/16/23 1202 11/22/23 2203   11/14/23 1630  metroNIDAZOLE  (FLAGYL ) IVPB 500 mg  Status:  Discontinued        500 mg 100 mL/hr over 60 Minutes Intravenous Every 8 hours 11/14/23 1534 11/20/23 1606   11/14/23 0200  cefTRIAXone  (ROCEPHIN ) 1 g in sodium chloride  0.9 % 100 mL IVPB  Status:  Discontinued        1 g 200 mL/hr over 30 Minutes Intravenous Every 24 hours 11/13/23 0821 11/20/23 1606   11/13/23 0215  cefTRIAXone  (ROCEPHIN ) 1 g in sodium chloride  0.9 % 100 mL IVPB        1 g 200 mL/hr over 30 Minutes Intravenous  Once 11/13/23 0208 11/13/23 0411        Medications  Scheduled Meds:  apixaban   5 mg Oral BID   aspirin  EC  81 mg Oral Daily   Chlorhexidine  Gluconate Cloth  6 each Topical Daily   collagenase   1 Application Topical Daily   feeding supplement (GLUCERNA SHAKE)  237 mL Oral BID BM   finasteride   5 mg Oral Daily   insulin  aspart  0-5 Units Subcutaneous QHS   insulin  aspart  0-9 Units Subcutaneous TID WC   mirtazapine   15 mg Oral QHS   PARoxetine   20 mg Oral Daily   polyethylene glycol powder  119 g Oral Once   QUEtiapine   50 mg Oral QHS   Continuous Infusions:  sodium chloride  irrigation     PRN Meds:.acetaminophen , bisacodyl , QUEtiapine  **OR** haloperidol  lactate, hydrALAZINE , ondansetron  (ZOFRAN )  IV, zinc  oxide    Subjective:   Tully Mcinturff was seen and examined today.  No new complaints. He wants to be discharged.   Objective:   Vitals:   12/12/23 2147 12/13/23 0521 12/13/23 0727 12/13/23 1513  BP: (!) 191/95 137/87 (!) 167/87 130/82  Pulse:  (!) 50 (!) 52 60  Resp:  18  16  Temp:  97.9 F (36.6 C) 98.2 F (36.8 C) 98.2 F (36.8 C)  TempSrc:  Oral Oral   SpO2:  99% 100% 98%  Weight:      Height:        Intake/Output Summary (Last 24 hours) at 12/13/2023 1550 Last data filed at 12/13/2023 0300 Gross per 24 hour  Intake 100 ml  Output  425 ml  Net -325 ml   Filed Weights   11/12/23 2154 12/11/23 1713  Weight: 68 kg 61.9 kg     Exam General exam: Appears calm and comfortable  Respiratory system: Clear to auscultation. Respiratory effort normal. Cardiovascular system: S1 & S2 heard, RRR Gastrointestinal system: Abdomen is soft bs+ Central nervous system: Alert and oriented.    Data Reviewed:  I have personally reviewed following labs and imaging studies   CBC Lab Results  Component Value Date   WBC 4.5 12/11/2023   RBC 2.78 (L) 12/11/2023   HGB 8.2 (L) 12/11/2023   HCT 25.7 (L) 12/11/2023   MCV 92.4 12/11/2023   MCH 29.5 12/11/2023   PLT 213 12/11/2023   MCHC 31.9 12/11/2023   RDW 17.5 (H) 12/11/2023   LYMPHSABS 0.8 11/24/2023   MONOABS 0.5 11/24/2023   EOSABS 0.1 11/24/2023   BASOSABS 0.1 11/24/2023     Last metabolic panel Lab Results  Component Value Date   NA 135 12/11/2023   K 4.8 12/11/2023   CL 101 12/11/2023   CO2 31 12/11/2023   BUN 21 12/11/2023   CREATININE 0.81 12/11/2023   GLUCOSE 174 (H) 12/11/2023   GFRNONAA >60 12/11/2023   GFRAA >60 06/09/2018   CALCIUM  8.0 (L) 12/11/2023   PHOS 3.5 11/24/2023   PROT 6.2 (L) 11/24/2023   ALBUMIN 2.2 (L) 11/24/2023   LABGLOB 3.8 08/26/2023   BILITOT 1.0 11/24/2023   ALKPHOS 132 (H) 11/24/2023   AST 17 11/24/2023   ALT 7 11/24/2023   ANIONGAP 3 (L) 12/11/2023    CBG  (last 3)  Recent Labs    12/12/23 2115 12/13/23 0613 12/13/23 1123  GLUCAP 160* 172* 170*      Coagulation Profile: No results for input(s): INR, PROTIME in the last 168 hours.   Radiology Studies: No results found.     Elgie Butter M.D. [name] 12/13/2023, 3:50 PM  Available via Epic secure chat 7am-7pm After 7 pm, please refer to night coverage provider listed on amion.

## 2023-12-14 DIAGNOSIS — N3001 Acute cystitis with hematuria: Secondary | ICD-10-CM | POA: Diagnosis not present

## 2023-12-14 LAB — GLUCOSE, CAPILLARY
Glucose-Capillary: 238 mg/dL — ABNORMAL HIGH (ref 70–99)
Glucose-Capillary: 217 mg/dL — ABNORMAL HIGH (ref 70–99)
Glucose-Capillary: 158 mg/dL — ABNORMAL HIGH (ref 70–99)
Glucose-Capillary: 185 mg/dL — ABNORMAL HIGH (ref 70–99)

## 2023-12-14 MED ORDER — MEGESTROL ACETATE 40 MG PO TABS
40.0000 mg | ORAL_TABLET | Freq: Every day | ORAL | Status: DC
  Administered 2023-12-14 – 2023-12-24 (×11): 40 mg via ORAL
  Filled 2023-12-14 (×11): qty 1

## 2023-12-14 NOTE — Progress Notes (Signed)
 [name]                                                                               [name] [name], is a 68 y.o. male, DOB - [birth year], FMW:[id number] Admit date - 11/12/2023    Outpatient Primary MD for the patient is [name], [name], PA-C  LOS - 31  days    Brief summary   [name] [name] is a 68 y.o. male with a history of atrial fibrillation with slow ventricular response, CAD, CABG x 4, aortic aneurysm s/p repair, CVA, diabetes mellitus type 2, heart failure with preserved EF. Patient presented secondary to abnormal metabolic panel and while in the emergency department found to have evidence of gross hematuria, anemia and AKI. Urology consulted. Patient started IV fluids. During workup, patient was Hemoccult positive, GI consulted. Hemoglobin continued to drift down requiring 1 unit PRBC via transfusion. CT imaging on admission was significant for right pleural effusion concerning for possible empyema. Pulmonology consulted for chest tube placement which was performed on 11/14 which was removed on 11/17.  Discharge complicated by unsafe discharge plan at this time. No new complaints at this time. Patient's son concerned about his lack of appetite. Megace  added.   Assessment & Plan    Assessment and Plan:  Gross hematuria with possible urinary tract infection. Probably related to Foley catheter trauma and complicated by Eliquis  use. CT abdomen pelvis significant for normal kidneys without hydronephrosis, nonobstructing stone versus vascular calcification of the upper pole left kidney.  Urology consulted and CBI started with resolution of hematuria.  Urine cultures growing 20,000 colonies of Staphylococcus hemolyticus. Patient also completed a course of antibiotics. Recommend finasteride  on discharge. No new events.     Acute blood loss anemia superimposed on chronic iron  deficiency anemia. Probably secondary to hematuria. S/p 1 unit of PRBC  transfusion and repeat hemoglobin has improved to 8.2   AKI Resolved   Heme positive stools In the setting of Eliquis  use.  GI consulted and patient underwent EGD showing nonerosive gastritis and multiple nonbleeding duodenal ulcers.  Colonoscopy deferred secondary to patient refusing the bowel prep. Bowel movements normal.     Paroxysmal atrial fibrillation Patient not on rate controlling medications secondary to slow ventricular response. Resume Eliquis .     Type 2 diabetes mellitus Continue with sliding scale insulin . Hemoglobin A1c of 8.3 CBG (last 3)  Recent Labs    12/14/23 0638 12/14/23 1158 12/14/23 1617  GLUCAP 238* 217* 158*   Continue with SSI.    History of coronary artery disease status post CABG   History of aortic aneurysm which has been stable.   Peripheral vascular disease   Right pleural effusion Noted on CT imaging with evidence of moderate-sized collection.  Concern for possible empyema secondary to gas seen on imaging.  Last thoracentesis was from February 2025, which was also right-sided and was secondary to heart failure.  Patient is completely asymptomatic.  Pulmonology consulted and placed a right-sided chest tube on 11/14 and have applied lytics for management of loculated effusion. Chest tube successfully removed on 11/17. Antibiotics completed.   Cognitive dysfunction Delirium Noted. History of severe delirium during prior hospitalization. Improved  with adjustment of antipsychotic regimen. -Continue Seroquel  50 mg at bedtime   Poor appetite Will request nutritionist consult in am.  Added megace .   RN Pressure Injury Documentation: Wound 09/12/23 1700 Pressure Injury Sacrum Stage 2 -  Partial thickness loss of dermis presenting as a shallow open injury with a red, pink wound bed without slough. (Active)      Estimated body mass index is 17.06 kg/m as calculated from the following:   Height as of this encounter: 6' 3 (1.905  m).   Weight as of this encounter: 61.9 kg.  Code Status: Full code DVT Prophylaxis:  Place and maintain sequential compression device Start: 11/20/23 1505 SCDs Start: 11/13/23 0733 apixaban  (ELIQUIS ) tablet 5 mg   Level of Care: Level of care: Med-Surg Family Communication: None at bedside  Disposition Plan:     Remains inpatient appropriate: Pending placement  Procedures:  EGD Chest tube placement  Consultants:   Gastroenterology Urology PCCM Psychiatry  Antimicrobials:   Anti-infectives (From admission, onward)    Start     Dose/Rate Route Frequency Ordered Stop   11/16/23 1315  sulfamethoxazole -trimethoprim  (BACTRIM  DS) 800-160 MG per tablet 1 tablet        1 tablet Oral Every 12 hours 11/16/23 1202 11/22/23 2203   11/14/23 1630  metroNIDAZOLE  (FLAGYL ) IVPB 500 mg  Status:  Discontinued        500 mg 100 mL/hr over 60 Minutes Intravenous Every 8 hours 11/14/23 1534 11/20/23 1606   11/14/23 0200  cefTRIAXone  (ROCEPHIN ) 1 g in sodium chloride  0.9 % 100 mL IVPB  Status:  Discontinued        1 g 200 mL/hr over 30 Minutes Intravenous Every 24 hours 11/13/23 0821 11/20/23 1606   11/13/23 0215  cefTRIAXone  (ROCEPHIN ) 1 g in sodium chloride  0.9 % 100 mL IVPB        1 g 200 mL/hr over 30 Minutes Intravenous  Once 11/13/23 0208 11/13/23 0411        Medications  Scheduled Meds:  apixaban   5 mg Oral BID   aspirin  EC  81 mg Oral Daily   Chlorhexidine  Gluconate Cloth  6 each Topical Daily   collagenase   1 Application Topical Daily   finasteride   5 mg Oral Daily   insulin  aspart  0-5 Units Subcutaneous QHS   insulin  aspart  0-9 Units Subcutaneous TID WC   megestrol   40 mg Oral Daily   mirtazapine   15 mg Oral QHS   PARoxetine   20 mg Oral Daily   polyethylene glycol powder  119 g Oral Once   QUEtiapine   50 mg Oral QHS   Continuous Infusions:  sodium chloride  irrigation     PRN Meds:.acetaminophen , bisacodyl , QUEtiapine  **OR** haloperidol  lactate, hydrALAZINE ,  ondansetron  (ZOFRAN ) IV, zinc  oxide    Subjective:   [name] [name] was seen and examined today. No new events.   Objective:   Vitals:   12/13/23 1927 12/14/23 0306 12/14/23 0742 12/14/23 1553  BP: (!) 150/74 139/75 (!) 141/67 (!) 151/75  Pulse: 60 63 63 (!) 48  Resp: 17 17 16 16   Temp: 98.3 F (36.8 C)  100.1 F (37.8 C) 98.3 F (36.8 C)  TempSrc:   Oral   SpO2: 99% 100% 98% 100%  Weight:      Height:        Intake/Output Summary (Last 24 hours) at 12/14/2023 1658 Last data filed at 12/14/2023 1500 Gross per 24 hour  Intake 480 ml  Output 1600 ml  Net -1120 ml  Filed Weights   11/12/23 2154 12/11/23 1713  Weight: 68 kg 61.9 kg     Exam General exam: Appears calm and comfortable  Respiratory system: Clear to auscultation. Respiratory effort normal. Cardiovascular system: S1 & S2 heard, RRR.  Gastrointestinal system: Abdomen is nondistended, soft and nontender. Central nervous system: Alert and oriented.  Extremities: Symmetric 5 x 5 power. Skin: No rashes, Psychiatry:  Mood & affect appropriate.      Data Reviewed:  I have personally reviewed following labs and imaging studies   CBC Lab Results  Component Value Date   WBC 4.5 12/11/2023   RBC 2.78 (L) 12/11/2023   HGB 8.2 (L) 12/11/2023   HCT 25.7 (L) 12/11/2023   MCV 92.4 12/11/2023   MCH 29.5 12/11/2023   PLT 213 12/11/2023   MCHC 31.9 12/11/2023   RDW 17.5 (H) 12/11/2023   LYMPHSABS 0.8 11/24/2023   MONOABS 0.5 11/24/2023   EOSABS 0.1 11/24/2023   BASOSABS 0.1 11/24/2023     Last metabolic panel Lab Results  Component Value Date   NA 135 12/11/2023   K 4.8 12/11/2023   CL 101 12/11/2023   CO2 31 12/11/2023   BUN 21 12/11/2023   CREATININE 0.81 12/11/2023   GLUCOSE 174 (H) 12/11/2023   GFRNONAA >60 12/11/2023   GFRAA >60 06/09/2018   CALCIUM  8.0 (L) 12/11/2023   PHOS 3.5 11/24/2023   PROT 6.2 (L) 11/24/2023   ALBUMIN 2.2 (L) 11/24/2023   LABGLOB 3.8 08/26/2023   BILITOT  1.0 11/24/2023   ALKPHOS 132 (H) 11/24/2023   AST 17 11/24/2023   ALT 7 11/24/2023   ANIONGAP 3 (L) 12/11/2023    CBG (last 3)  Recent Labs    12/14/23 0638 12/14/23 1158 12/14/23 1617  GLUCAP 238* 217* 158*      Coagulation Profile: No results for input(s): INR, PROTIME in the last 168 hours.   Radiology Studies: No results found.     Elgie Butter M.D. [name] 12/14/2023, 4:58 PM  Available via Epic secure chat 7am-7pm After 7 pm, please refer to night coverage provider listed on amion.

## 2023-12-15 DIAGNOSIS — J90 Pleural effusion, not elsewhere classified: Secondary | ICD-10-CM | POA: Diagnosis not present

## 2023-12-15 DIAGNOSIS — N179 Acute kidney failure, unspecified: Secondary | ICD-10-CM | POA: Diagnosis not present

## 2023-12-15 DIAGNOSIS — R195 Other fecal abnormalities: Secondary | ICD-10-CM | POA: Diagnosis not present

## 2023-12-15 DIAGNOSIS — D509 Iron deficiency anemia, unspecified: Secondary | ICD-10-CM | POA: Diagnosis not present

## 2023-12-15 LAB — GLUCOSE, CAPILLARY
Glucose-Capillary: 169 mg/dL — ABNORMAL HIGH (ref 70–99)
Glucose-Capillary: 174 mg/dL — ABNORMAL HIGH (ref 70–99)
Glucose-Capillary: 161 mg/dL — ABNORMAL HIGH (ref 70–99)
Glucose-Capillary: 203 mg/dL — ABNORMAL HIGH (ref 70–99)
Glucose-Capillary: 239 mg/dL — ABNORMAL HIGH (ref 70–99)

## 2023-12-15 MED ORDER — QUETIAPINE FUMARATE 25 MG PO TABS
25.0000 mg | ORAL_TABLET | Freq: Every day | ORAL | Status: DC
  Administered 2023-12-15 – 2023-12-23 (×9): 25 mg via ORAL
  Filled 2023-12-15 (×9): qty 1

## 2023-12-15 NOTE — Progress Notes (Signed)
 [name]                                                                               [name] [name], is a 68 y.o. male, DOB - 1955-11-26, FMW:[id number] Admit date - 11/12/2023    Outpatient Primary MD for the patient is [name], [name] M, PA-C  LOS - 32  days    Brief summary   [name] [name] is a 68 y.o. male with a history of atrial fibrillation with slow ventricular response, CAD, CABG x 4, aortic aneurysm s/p repair, CVA, diabetes mellitus type 2, heart failure with preserved EF. Patient presented secondary to abnormal metabolic panel and while in the emergency department found to have evidence of gross hematuria, anemia and AKI. Urology consulted. Patient started IV fluids. During workup, patient was Hemoccult positive, GI consulted. Hemoglobin continued to drift down requiring 1 unit PRBC via transfusion. CT imaging on admission was significant for right pleural effusion concerning for possible empyema. Pulmonology consulted for chest tube placement which was performed on 11/14 which was removed on 11/17.  Discharge complicated by unsafe discharge plan at this time.   Assessment & Plan    Assessment and Plan:  Gross hematuria with possible urinary tract infection. Probably related to Foley catheter trauma and complicated by Eliquis  use. CT abdomen pelvis significant for normal kidneys without hydronephrosis, nonobstructing stone versus vascular calcification of the upper pole left kidney.  Urology consulted and CBI started with resolution of hematuria.  Urine cultures growing 20,000 colonies of Staphylococcus hemolyticus. Patient also completed a course of antibiotics. Recommend finasteride  on discharge. No new events.     Acute blood loss anemia superimposed on chronic iron  deficiency anemia. Probably secondary to hematuria. S/p 1 unit of PRBC transfusion and repeat hemoglobin has improved to 8.2 Repeat labs in am.    AKI Resolved   Heme  positive stools In the setting of Eliquis  use.  GI consulted and patient underwent EGD showing nonerosive gastritis and multiple nonbleeding duodenal ulcers.  Colonoscopy deferred secondary to patient refusing the bowel prep. Bowel movements normal.  Will check hemoglobin in am.     Paroxysmal atrial fibrillation Patient not on rate controlling medications secondary to slow ventricular response. Resume Eliquis .     Type 2 diabetes mellitus Continue with sliding scale insulin . Hemoglobin A1c of 8.3 CBG (last 3)  Recent Labs    12/15/23 0614 12/15/23 0819 12/15/23 1134  GLUCAP 169* 174* 161*   Liberalized diet to regular as he has poor appetite.     History of coronary artery disease status post CABG   History of aortic aneurysm which has been stable.   Peripheral vascular disease   Right pleural effusion Noted on CT imaging with evidence of moderate-sized collection.  Concern for possible empyema secondary to gas seen on imaging.  Last thoracentesis was from February 2025, which was also right-sided and was secondary to heart failure.  Patient is completely asymptomatic.  Pulmonology consulted and placed a right-sided chest tube on 11/14 and have applied lytics for management of loculated effusion. Chest tube successfully removed on 11/17. Antibiotics completed.   Cognitive dysfunction Delirium Noted. History of severe delirium during prior  hospitalization. Improved with adjustment of antipsychotic regimen. Pt more lethargic during the day, will decrease the seroquel  to 25 mg daily and monitor.   Poor appetite Will request nutritionist consult in am.  Added megace .   RN Pressure Injury Documentation: Wound 09/12/23 1700 Pressure Injury Sacrum Stage 2 -  Partial thickness loss of dermis presenting as a shallow open injury with a red, pink wound bed without slough. (Active)      Estimated body mass index is 16.92 kg/m as calculated from the following:   Height  as of this encounter: 6' 3 (1.905 m).   Weight as of this encounter: 61.4 kg.  Code Status: Full code DVT Prophylaxis:  Place and maintain sequential compression device Start: 11/20/23 1505 SCDs Start: 11/13/23 0733 apixaban  (ELIQUIS ) tablet 5 mg   Level of Care: Level of care: Med-Surg Family Communication: None at bedside  Disposition Plan:     Remains inpatient appropriate: Pending placement  Procedures:  EGD Chest tube placement  Consultants:   Gastroenterology Urology PCCM Psychiatry  Antimicrobials:   Anti-infectives (From admission, onward)    Start     Dose/Rate Route Frequency Ordered Stop   11/16/23 1315  sulfamethoxazole -trimethoprim  (BACTRIM  DS) 800-160 MG per tablet 1 tablet        1 tablet Oral Every 12 hours 11/16/23 1202 11/22/23 2203   11/14/23 1630  metroNIDAZOLE  (FLAGYL ) IVPB 500 mg  Status:  Discontinued        500 mg 100 mL/hr over 60 Minutes Intravenous Every 8 hours 11/14/23 1534 11/20/23 1606   11/14/23 0200  cefTRIAXone  (ROCEPHIN ) 1 g in sodium chloride  0.9 % 100 mL IVPB  Status:  Discontinued        1 g 200 mL/hr over 30 Minutes Intravenous Every 24 hours 11/13/23 0821 11/20/23 1606   11/13/23 0215  cefTRIAXone  (ROCEPHIN ) 1 g in sodium chloride  0.9 % 100 mL IVPB        1 g 200 mL/hr over 30 Minutes Intravenous  Once 11/13/23 0208 11/13/23 0411        Medications  Scheduled Meds:  apixaban   5 mg Oral BID   aspirin  EC  81 mg Oral Daily   Chlorhexidine  Gluconate Cloth  6 each Topical Daily   collagenase   1 Application Topical Daily   finasteride   5 mg Oral Daily   insulin  aspart  0-5 Units Subcutaneous QHS   insulin  aspart  0-9 Units Subcutaneous TID WC   megestrol   40 mg Oral Daily   mirtazapine   15 mg Oral QHS   PARoxetine   20 mg Oral Daily   polyethylene glycol powder  119 g Oral Once   QUEtiapine   50 mg Oral QHS   Continuous Infusions:  sodium chloride  irrigation     PRN Meds:.acetaminophen , bisacodyl , QUEtiapine  **OR**  haloperidol  lactate, hydrALAZINE , ondansetron  (ZOFRAN ) IV, zinc  oxide    Subjective:   [name] was seen and examined today.  No new events overnight.   Objective:   Vitals:   12/14/23 2000 12/15/23 0500 12/15/23 0530 12/15/23 0818  BP: 132/67  115/63 (!) 86/58  Pulse: 97  (!) 57 (!) 59  Resp: 18  18 18   Temp: 98 F (36.7 C)  98.1 F (36.7 C) 98.2 F (36.8 C)  TempSrc: Oral  Oral   SpO2: 99%  99% 100%  Weight:  61.4 kg    Height:        Intake/Output Summary (Last 24 hours) at 12/15/2023 1155 Last data filed at 12/14/2023 1500 Gross per  24 hour  Intake 240 ml  Output 300 ml  Net -60 ml   Filed Weights   11/12/23 2154 12/11/23 1713 12/15/23 0500  Weight: 68 kg 61.9 kg 61.4 kg     Exam General exam: Appears calm and comfortable  Respiratory system: Clear to auscultation. Respiratory effort normal. Cardiovascular system: S1 & S2 heard, RRR.  Gastrointestinal system: Abdomen is soft bs+ Central nervous system: Alert and oriented.  Extremities: no pedal edema.  Skin: No rashes, Psychiatry: flat affect.       Data Reviewed:  I have personally reviewed following labs and imaging studies   CBC Lab Results  Component Value Date   WBC 4.5 12/11/2023   RBC 2.78 (L) 12/11/2023   HGB 8.2 (L) 12/11/2023   HCT 25.7 (L) 12/11/2023   MCV 92.4 12/11/2023   MCH 29.5 12/11/2023   PLT 213 12/11/2023   MCHC 31.9 12/11/2023   RDW 17.5 (H) 12/11/2023   LYMPHSABS 0.8 11/24/2023   MONOABS 0.5 11/24/2023   EOSABS 0.1 11/24/2023   BASOSABS 0.1 11/24/2023     Last metabolic panel Lab Results  Component Value Date   NA 135 12/11/2023   K 4.8 12/11/2023   CL 101 12/11/2023   CO2 31 12/11/2023   BUN 21 12/11/2023   CREATININE 0.81 12/11/2023   GLUCOSE 174 (H) 12/11/2023   GFRNONAA >60 12/11/2023   GFRAA >60 06/09/2018   CALCIUM  8.0 (L) 12/11/2023   PHOS 3.5 11/24/2023   PROT 6.2 (L) 11/24/2023   ALBUMIN 2.2 (L) 11/24/2023   LABGLOB 3.8 08/26/2023    BILITOT 1.0 11/24/2023   ALKPHOS 132 (H) 11/24/2023   AST 17 11/24/2023   ALT 7 11/24/2023   ANIONGAP 3 (L) 12/11/2023    CBG (last 3)  Recent Labs    12/15/23 0614 12/15/23 0819 12/15/23 1134  GLUCAP 169* 174* 161*      Coagulation Profile: No results for input(s): INR, PROTIME in the last 168 hours.   Radiology Studies: No results found.     Elgie Butter M.D. [name] 12/15/2023, 11:55 AM  Available via Epic secure chat 7am-7pm After 7 pm, please refer to night coverage provider listed on amion.

## 2023-12-15 NOTE — Plan of Care (Signed)
   Problem: Activity: Goal: Risk for activity intolerance will decrease Outcome: Progressing   Problem: Nutrition: Goal: Adequate nutrition will be maintained Outcome: Progressing   Problem: Elimination: Goal: Will not experience complications related to bowel motility Outcome: Progressing   Problem: Safety: Goal: Ability to remain free from injury will improve Outcome: Progressing

## 2023-12-16 DIAGNOSIS — D509 Iron deficiency anemia, unspecified: Secondary | ICD-10-CM | POA: Diagnosis not present

## 2023-12-16 DIAGNOSIS — R310 Gross hematuria: Secondary | ICD-10-CM | POA: Diagnosis not present

## 2023-12-16 DIAGNOSIS — R195 Other fecal abnormalities: Secondary | ICD-10-CM | POA: Diagnosis not present

## 2023-12-16 DIAGNOSIS — N179 Acute kidney failure, unspecified: Secondary | ICD-10-CM | POA: Diagnosis not present

## 2023-12-16 DIAGNOSIS — J90 Pleural effusion, not elsewhere classified: Secondary | ICD-10-CM | POA: Diagnosis not present

## 2023-12-16 LAB — CBC WITH DIFFERENTIAL/PLATELET
Abs Immature Granulocytes: 0.01 K/uL (ref 0.00–0.07)
Basophils Absolute: 0 K/uL (ref 0.0–0.1)
Basophils Relative: 0 %
Eosinophils Absolute: 0.1 K/uL (ref 0.0–0.5)
Eosinophils Relative: 2 %
HCT: 26.4 % — ABNORMAL LOW (ref 39.0–52.0)
Hemoglobin: 8.4 g/dL — ABNORMAL LOW (ref 13.0–17.0)
Immature Granulocytes: 0 %
Lymphocytes Relative: 16 %
Lymphs Abs: 0.8 K/uL (ref 0.7–4.0)
MCH: 29.6 pg (ref 26.0–34.0)
MCHC: 31.8 g/dL (ref 30.0–36.0)
MCV: 93 fL (ref 80.0–100.0)
Monocytes Absolute: 0.4 K/uL (ref 0.1–1.0)
Monocytes Relative: 8 %
Neutro Abs: 4 K/uL (ref 1.7–7.7)
Neutrophils Relative %: 74 %
Platelets: 217 K/uL (ref 150–400)
RBC: 2.84 MIL/uL — ABNORMAL LOW (ref 4.22–5.81)
RDW: 17.4 % — ABNORMAL HIGH (ref 11.5–15.5)
WBC: 5.4 K/uL (ref 4.0–10.5)
nRBC: 0 % (ref 0.0–0.2)

## 2023-12-16 LAB — GLUCOSE, CAPILLARY
Glucose-Capillary: 162 mg/dL — ABNORMAL HIGH (ref 70–99)
Glucose-Capillary: 197 mg/dL — ABNORMAL HIGH (ref 70–99)
Glucose-Capillary: 167 mg/dL — ABNORMAL HIGH (ref 70–99)
Glucose-Capillary: 253 mg/dL — ABNORMAL HIGH (ref 70–99)

## 2023-12-16 LAB — BASIC METABOLIC PANEL WITH GFR
Anion gap: 6 (ref 5–15)
BUN: 30 mg/dL — ABNORMAL HIGH (ref 8–23)
CO2: 30 mmol/L (ref 22–32)
Calcium: 8.3 mg/dL — ABNORMAL LOW (ref 8.9–10.3)
Chloride: 100 mmol/L (ref 98–111)
Creatinine, Ser: 1.28 mg/dL — ABNORMAL HIGH (ref 0.61–1.24)
GFR, Estimated: >60 mL/min (ref >60)
Glucose, Bld: 234 mg/dL — ABNORMAL HIGH (ref 70–99)
Potassium: 4.7 mmol/L (ref 3.5–5.1)
Sodium: 136 mmol/L (ref 135–145)

## 2023-12-16 MED ORDER — GLUCERNA SHAKE PO LIQD
237.0000 mL | Freq: Three times a day (TID) | ORAL | Status: DC
  Administered 2023-12-16 – 2023-12-23 (×18): 237 mL via ORAL
  Filled 2023-12-16 (×2): qty 237

## 2023-12-16 MED ORDER — JUVEN PO PACK
1.0000 | PACK | Freq: Two times a day (BID) | ORAL | Status: DC
  Administered 2023-12-16 – 2023-12-24 (×15): 1 via ORAL
  Filled 2023-12-16 (×15): qty 1

## 2023-12-16 MED ORDER — ADULT MULTIVITAMIN W/MINERALS CH
1.0000 | ORAL_TABLET | Freq: Every day | ORAL | Status: DC
  Administered 2023-12-16 – 2023-12-24 (×9): 1 via ORAL
  Filled 2023-12-16 (×9): qty 1

## 2023-12-16 NOTE — Progress Notes (Signed)
 Occupational Therapy Treatment Patient Details Name: [name] [name] MRN: [id number] DOB: [birth year] Today's Date: 12/16/2023   History of present illness 68 y.o. male admitted 11/12/23 for abnormal renal labs. Found to have AKI and potential UTI. Foley catheter was inserted.  He has since been having hematuria, possibly trauma in the setting of Eliquis . Pt with worsening of chronic normocytic anemia. During workup, patient was Hemoccult positive, GI consulted. Hemoglobin continued to drift down requiring 1 unit PRBC via transfusion. CT imaging on admission was significant for right pleural effusion concerning for possible empyema. Pulmonology consulted for chest tube placement which was performed on 11/14 which was removed on 11/17.  Discharge complicated by unsafe discharge plan at this time. PMHx: PAF, chronic A-Fib, CHF, CAD s/p CABG, PAD, T2DM, HTN, HLD, chronic decubitus ulcer,  chronic anemia with heme positive stool, and chronic diarrhea. Of note, recent hospitalization 10/22-11/5 after fall sustaining L SDH with 4mm shift.   OT comments  Pt with slow progress towards OT goals. Focus of session on increasing engagement and independence with ADL tasks, as well as addressing BUE weakness through engagement in BUE HEP program as outlined below. OT provided Max encouragement in OOB mobility and educated Pt on importance of mobilization. Pt continued to decline any type of repositioning and transfer this date. OT to continue to follow Pt acutely to facilitate progress towards goals. Continue per POC.       If plan is discharge home, recommend the following:  A little help with bathing/dressing/bathroom;A lot of help with walking and/or transfers;Direct supervision/assist for financial management;Direct supervision/assist for medications management;Assistance with cooking/housework;Assist for transportation;Help with stairs or ramp for entrance   Equipment Recommendations  None recommended by  OT    Recommendations for Other Services      Precautions / Restrictions Precautions Precautions: Fall Recall of Precautions/Restrictions: Impaired Precaution/Restrictions Comments: low vision. HOH. hx TBI Restrictions Weight Bearing Restrictions Per Provider Order: No       Mobility Bed Mobility               General bed mobility comments: Pt greeted in bed, Max encouragement to mobilize OOB or reposition, Pt declining. OT attempted to offer cushion to decreas buttocks pain, Pt declined.    Transfers                   General transfer comment: Pt declined OOB transfer this session.     Balance       Sitting balance - Comments: Pt sat in bed with HOB elevated, requiring posterior support                                   ADL either performed or assessed with clinical judgement   ADL Overall ADL's : Needs assistance/impaired Eating/Feeding: Set up;Bed level   Grooming: Oral care;Moderate assistance;Bed level Grooming Details (indicate cue type and reason): Hand over hand assistance for oral care due to low vision.                                    Extremity/Trunk Assessment Upper Extremity Assessment Upper Extremity Assessment: Generalized weakness            Vision       Perception     Praxis     Communication Communication Communication: Impaired Factors Affecting Communication: Hearing impaired  Cognition Arousal: Alert Behavior During Therapy: Flat affect Cognition: Cognition impaired   Orientation impairments: Situation Awareness: Online awareness impaired Memory impairment (select all impairments): Short-term memory, Working civil service fast streamer, Conservation officer, historic buildings Attention impairment (select first level of impairment): Sustained attention Executive functioning impairment (select all impairments): Reasoning, Problem solving, Sequencing, Organization OT - Cognition Comments: Decreased executive  functioning skills and insight into deficits. Pt seen in the afternoon, states he is currently waiting for his breakfast                 Following commands: Impaired Following commands impaired: Follows one step commands with increased time, Follows one step commands inconsistently      Cueing   Cueing Techniques: Verbal cues, Gestural cues, Visual cues  Exercises Exercises: General Upper Extremity, Other exercises General Exercises - Upper Extremity Shoulder Flexion: AROM, Both, 10 reps Shoulder Horizontal ABduction: AROM, Strengthening, Both, 10 reps, Theraband Theraband Level (Shoulder Horizontal Abduction): Level 1 (Yellow) Elbow Flexion: AROM, Both, 10 reps, Theraband Theraband Level (Elbow Flexion): Level 1 (Yellow) Other Exercises Other Exercises: shoulder external rotation, AROM, both, 10 reps, yellow theraband    Shoulder Instructions       General Comments Pt reoriented during session. Encouraged to engage in mobility, Pt continues to decline without stating specific reason.    Pertinent Vitals/ Pain       Pain Assessment Pain Assessment: Faces Faces Pain Scale: Hurts a little bit Pain Location: buttocks Pain Descriptors / Indicators: Discomfort Pain Intervention(s): Limited activity within patient's tolerance, Monitored during session  Home Living                                          Prior Functioning/Environment              Frequency  Min 2X/week        Progress Toward Goals  OT Goals(current goals can now be found in the care plan section)  Progress towards OT goals: Progressing toward goals     Plan      Co-evaluation                 AM-PAC OT 6 Clicks Daily Activity     Outcome Measure   Help from another person eating meals?: A Little Help from another person taking care of personal grooming?: A Little Help from another person toileting, which includes using toliet, bedpan, or urinal?: A Lot Help  from another person bathing (including washing, rinsing, drying)?: A Little Help from another person to put on and taking off regular upper body clothing?: A Little Help from another person to put on and taking off regular lower body clothing?: A Lot 6 Click Score: 16    End of Session    OT Visit Diagnosis: Other abnormalities of gait and mobility (R26.89);Muscle weakness (generalized) (M62.81);Other symptoms and signs involving cognitive function   Activity Tolerance Patient limited by lethargy   Patient Left in bed;with call bell/phone within reach   Nurse Communication          Time: 8443-8391 OT Time Calculation (min): 12 min  Charges: OT General Charges $OT Visit: 1 Visit OT Treatments $Therapeutic Activity: 8-22 mins  Maurilio CROME, OTR/L.  [facility]  Office: [phone]   Maurilio PARAS Jaja Switalski 12/16/2023, 4:32 PM

## 2023-12-16 NOTE — Plan of Care (Signed)
   Problem: Education: Goal: Knowledge of General Education information will improve Description: Including pain rating scale, medication(s)/side effects and non-pharmacologic comfort measures Outcome: Progressing   Problem: Health Behavior/Discharge Planning: Goal: Ability to manage health-related needs will improve Outcome: Progressing   Problem: Nutrition: Goal: Adequate nutrition will be maintained Outcome: Progressing

## 2023-12-16 NOTE — Plan of Care (Signed)
°  Problem: Education: Goal: Knowledge of General Education information will improve Description: Including pain rating scale, medication(s)/side effects and non-pharmacologic comfort measures Outcome: Progressing   Problem: Activity: Goal: Risk for activity intolerance will decrease Outcome: Progressing   Problem: Skin Integrity: Goal: Risk for impaired skin integrity will decrease Outcome: Progressing   Problem: Education: Goal: Ability to describe self-care measures that may prevent or decrease complications (Diabetes Survival Skills Education) will improve Outcome: Progressing

## 2023-12-16 NOTE — Progress Notes (Signed)
 [name]                                                                               [name] [name], is a 68 y.o. male, DOB - [birth year], FMW:[id number] Admit date - 11/12/2023    Outpatient Primary MD for the patient is [name], [name], PA-C  LOS - 33  days    Brief summary   [name] [name] is a 68 y.o. male with a history of atrial fibrillation with slow ventricular response, CAD, CABG x 4, aortic aneurysm s/p repair, CVA, diabetes mellitus type 2, heart failure with preserved EF. Patient presented secondary to abnormal metabolic panel and while in the emergency department found to have evidence of gross hematuria, anemia and AKI. Urology consulted. Patient started IV fluids. During workup, patient was Hemoccult positive, GI consulted. Hemoglobin continued to drift down requiring 1 unit PRBC via transfusion. CT imaging on admission was significant for right pleural effusion concerning for possible empyema. Pulmonology consulted for chest tube placement which was performed on 11/14 which was removed on 11/17.  Discharge complicated by unsafe discharge plan at this time.   Assessment & Plan    Assessment and Plan:  Gross hematuria with possible urinary tract infection. Probably related to Foley catheter trauma and complicated by Eliquis  use. CT abdomen pelvis significant for normal kidneys without hydronephrosis, nonobstructing stone versus vascular calcification of the upper pole left kidney.  Urology consulted and CBI started with resolution of hematuria.  Urine cultures growing 20,000 colonies of Staphylococcus hemolyticus. Patient also completed a course of antibiotics. Recommend finasteride  on discharge. No new events.     Acute blood loss anemia superimposed on chronic iron  deficiency anemia. Probably secondary to hematuria. S/p 1 unit of PRBC transfusion and repeat hemoglobin has improved to 8.2 Repeat labs today.    AKI Resolved. New labs  ordered and pending.    Heme positive stools In the setting of Eliquis  use.  GI consulted and patient underwent EGD showing nonerosive gastritis and multiple nonbleeding duodenal ulcers.  Colonoscopy deferred secondary to patient refusing the bowel prep. Bowel movements normal.     Paroxysmal atrial fibrillation Patient not on rate controlling medications secondary to slow ventricular response. Resume Eliquis .     Type 2 diabetes mellitus Continue with sliding scale insulin . Hemoglobin A1c of 8.3 CBG (last 3)  Recent Labs    12/15/23 2220 12/16/23 0614 12/16/23 1130  GLUCAP 239* 162* 197*   Liberalized diet to regular as he has poor appetite.  His appetite seems to be improving.  Will increase the SSI to moderate scale if his cbg's continue to increase.     History of coronary artery disease status post CABG   History of aortic aneurysm which has been stable.   Peripheral vascular disease   Right pleural effusion Noted on CT imaging with evidence of moderate-sized collection.  Concern for possible empyema secondary to gas seen on imaging.  Last thoracentesis was from February 2025, which was also right-sided and was secondary to heart failure.  Patient is completely asymptomatic.  Pulmonology consulted and placed a right-sided chest tube on 11/14 and have applied lytics for management of loculated effusion.  Chest tube successfully removed on 11/17. Antibiotics completed.   Cognitive dysfunction Delirium Noted. History of severe delirium during prior hospitalization. Improved with adjustment of antipsychotic regimen. He is more alert today and argumentative when seroquel  dose was decreased to 25 mg at bedtime.  He worked with PT reluctantly, and continued to refuse going anywhere but home.   Poor appetite Nutrition consulted.  Added megace .   RN Pressure Injury Documentation: Wound 09/12/23 1700 Pressure Injury Sacrum Stage 2 -  Partial thickness loss of dermis  presenting as a shallow open injury with a red, pink wound bed without slough. (Active)      Estimated body mass index is 16.86 kg/[name] as calculated from the following:   Height as of this encounter: 6' 3 (1.905 [name]).   Weight as of this encounter: 61.2 kg.  Code Status: Full code DVT Prophylaxis:  Place and maintain sequential compression device Start: 11/20/23 1505 SCDs Start: 11/13/23 0733 apixaban  (ELIQUIS ) tablet 5 mg   Level of Care: Level of care: Med-Surg Family Communication: None at bedside  Disposition Plan:     Remains inpatient appropriate: Pending placement  Procedures:  EGD Chest tube placement  Consultants:   Gastroenterology Urology PCCM Psychiatry  Antimicrobials:   Anti-infectives (From admission, onward)    Start     Dose/Rate Route Frequency Ordered Stop   11/16/23 1315  sulfamethoxazole -trimethoprim  (BACTRIM  DS) 800-160 MG per tablet 1 tablet        1 tablet Oral Every 12 hours 11/16/23 1202 11/22/23 2203   11/14/23 1630  metroNIDAZOLE  (FLAGYL ) IVPB 500 mg  Status:  Discontinued        500 mg 100 mL/hr over 60 Minutes Intravenous Every 8 hours 11/14/23 1534 11/20/23 1606   11/14/23 0200  cefTRIAXone  (ROCEPHIN ) 1 g in sodium chloride  0.9 % 100 mL IVPB  Status:  Discontinued        1 g 200 mL/hr over 30 Minutes Intravenous Every 24 hours 11/13/23 0821 11/20/23 1606   11/13/23 0215  cefTRIAXone  (ROCEPHIN ) 1 g in sodium chloride  0.9 % 100 mL IVPB        1 g 200 mL/hr over 30 Minutes Intravenous  Once 11/13/23 0208 11/13/23 0411        Medications  Scheduled Meds:  apixaban   5 mg Oral BID   aspirin  EC  81 mg Oral Daily   Chlorhexidine  Gluconate Cloth  6 each Topical Daily   collagenase   1 Application Topical Daily   feeding supplement (GLUCERNA SHAKE)  237 mL Oral TID BM   finasteride   5 mg Oral Daily   insulin  aspart  0-5 Units Subcutaneous QHS   insulin  aspart  0-9 Units Subcutaneous TID WC   megestrol   40 mg Oral Daily   mirtazapine   15  mg Oral QHS   multivitamin with minerals  1 tablet Oral Daily   nutrition supplement (JUVEN)  1 packet Oral BID BM   PARoxetine   20 mg Oral Daily   polyethylene glycol powder  119 g Oral Once   QUEtiapine   25 mg Oral QHS   Continuous Infusions:  sodium chloride  irrigation     PRN Meds:.acetaminophen , bisacodyl , QUEtiapine  **OR** haloperidol  lactate, hydrALAZINE , ondansetron  (ZOFRAN ) IV, zinc  oxide    Subjective:   [name] [name] was seen and examined today.  No new events.   Objective:   Vitals:   12/16/23 0351 12/16/23 0600 12/16/23 0807 12/16/23 1357  BP: (!) 192/90 (!) 173/79 (!) 104/57 (!) 153/79  Pulse:   97 62  Resp:      Temp:   98.6 F (37 C) 98.6 F (37 C)  TempSrc:   Oral Oral  SpO2:   100% 99%  Weight:  61.2 kg    Height:        Intake/Output Summary (Last 24 hours) at 12/16/2023 1413 Last data filed at 12/16/2023 9187 Gross per 24 hour  Intake 480 ml  Output 1165 ml  Net -685 ml   Filed Weights   12/11/23 1713 12/15/23 0500 12/16/23 0600  Weight: 61.9 kg 61.4 kg 61.2 kg     Exam General exam: ill appearing gentleman, not in distress.  CVS s1s2,  Lungs clear Abdomen soft bs+ Neuro; alert and oriented to person and place only.      Data Reviewed:  I have personally reviewed following labs and imaging studies   CBC Lab Results  Component Value Date   WBC 4.5 12/11/2023   RBC 2.78 (L) 12/11/2023   HGB 8.2 (L) 12/11/2023   HCT 25.7 (L) 12/11/2023   MCV 92.4 12/11/2023   MCH 29.5 12/11/2023   PLT 213 12/11/2023   MCHC 31.9 12/11/2023   RDW 17.5 (H) 12/11/2023   LYMPHSABS 0.8 11/24/2023   MONOABS 0.5 11/24/2023   EOSABS 0.1 11/24/2023   BASOSABS 0.1 11/24/2023     Last metabolic panel Lab Results  Component Value Date   NA 135 12/11/2023   K 4.8 12/11/2023   CL 101 12/11/2023   CO2 31 12/11/2023   BUN 21 12/11/2023   CREATININE 0.81 12/11/2023   GLUCOSE 174 (H) 12/11/2023   GFRNONAA >60 12/11/2023   GFRAA >60 06/09/2018    CALCIUM  8.0 (L) 12/11/2023   PHOS 3.5 11/24/2023   PROT 6.2 (L) 11/24/2023   ALBUMIN 2.2 (L) 11/24/2023   LABGLOB 3.8 08/26/2023   BILITOT 1.0 11/24/2023   ALKPHOS 132 (H) 11/24/2023   AST 17 11/24/2023   ALT 7 11/24/2023   ANIONGAP 3 (L) 12/11/2023    CBG (last 3)  Recent Labs    12/15/23 2220 12/16/23 0614 12/16/23 1130  GLUCAP 239* 162* 197*      Coagulation Profile: No results for input(s): INR, PROTIME in the last 168 hours.   Radiology Studies: No results found.     Elgie Butter [name].D. [name] 12/16/2023, 2:13 PM  Available via [employer] secure chat 7am-7pm After 7 pm, please refer to night coverage provider listed on amion.

## 2023-12-16 NOTE — Progress Notes (Addendum)
 Initial Nutrition Assessment  DOCUMENTATION CODES:  Severe malnutrition in context of chronic illness, Underweight  INTERVENTION:  Glucerna Shake PO TID. Each supplement provides 220 Kcals and 10 grams of protein. 1 packet Juven BID to support wound healing. Each packet provides 95 calories, 2.5 grams of protein (collagen), and 9.8 grams of carbohydrate (3 grams sugar); also contains 7 grams of L-arginine and L-glutamine, 300 mg vitamin C, 15 mg vitamin E, 1.2 mcg vitamin B-12, 9.5 mg zinc , 200 mg calcium , and 1.5 g Calcium  Beta-hydroxy-Beta-methylbutyrate. Multivitamin PO once daily. Consider reducing to just one appetite stimulant, the patient is currently on both Megace  and Remeron .   NUTRITION DIAGNOSIS:  Severe Malnutrition related to chronic illness as evidenced by severe fat depletion, severe muscle depletion, percent weight loss   GOAL:  Patient will meet greater than or equal to 90% of their needs, Weight gain   MONITOR:  PO intake, Supplement acceptance, Labs, Weight trends, Skin  REASON FOR ASSESSMENT:  Consult Assessment of nutrition requirement/status  ASSESSMENT:  Patient presented with gross hematuria and hemoccult+, and was found to have UTI 2/2 Foley trauma, pleural effusion, AKI, and anemia. PMH significant for recent SDH 2/2 fall 10/2023, Stage 2 PI sacrum, severe malnutrition, poor appetite requiring appetite stimulant, DM2, CHF, CAD/CABG, aortic aneurysm, PVD, HTN, dyslipidemia, cognitive dysfunction/delirium, and anxiety.  11/14 chest tube placement for pleural effusion 11/17 chest tube removed 11/23 EGD = Multiple non-bleeding duodenal ulcers with no stigmata of bleeding. Colonoscopy aborted due to poor bowel prep. 11/24 patient refused colonoscopy prep for repeat colonoscopy. GI signed off. 11/25 psych signed off, patient at low risk for self harm 12/11 palliative signed off, full code/full scope of care  Visited the patient who states he has a good appetite  but the food sucks here. He does like the pizza and grilled cheese. He tells me the pressure injury on his back is still active. He is amenable to Glucerna and Juven.  Scheduled Meds:  apixaban   5 mg Oral BID   aspirin  EC  81 mg Oral Daily   Chlorhexidine  Gluconate Cloth  6 each Topical Daily   collagenase   1 Application Topical Daily   finasteride   5 mg Oral Daily   insulin  aspart  0-5 Units Subcutaneous QHS   insulin  aspart  0-9 Units Subcutaneous TID WC   megestrol   40 mg Oral Daily   mirtazapine   15 mg Oral QHS   PARoxetine   20 mg Oral Daily   polyethylene glycol powder  119 g Oral Once   QUEtiapine   25 mg Oral QHS   Continuous Infusions:  sodium chloride  irrigation     PRN Meds:.acetaminophen , bisacodyl , QUEtiapine  **OR** haloperidol  lactate, hydrALAZINE , ondansetron  (ZOFRAN ) IV, zinc  oxide  Diet Order             Diet regular Room service appropriate? Yes; Fluid consistency: Thin  Diet effective now                  Meal Intake: 75-100%  Labs:     Latest Ref Rng & Units 12/11/2023    4:41 AM 11/25/2023    5:20 AM 11/24/2023    5:49 AM  CMP  Glucose 70 - 99 mg/dL 825  873  874   BUN 8 - 23 mg/dL 21  8  8    Creatinine 0.61 - 1.24 mg/dL 9.18  9.31  9.34   Sodium 135 - 145 mmol/L 135  134  135   Potassium 3.5 - 5.1 mmol/L 4.8  4.2  5.1   Chloride 98 - 111 mmol/L 101  101  102   CO2 22 - 32 mmol/L 31  22  24    Calcium  8.9 - 10.3 mg/dL 8.0  7.9  8.3   Total Protein 6.5 - 8.1 g/dL   6.2   Total Bilirubin 0.0 - 1.2 mg/dL   1.0   Alkaline Phos 38 - 126 U/L   132   AST 15 - 41 U/L   17   ALT 0 - 44 U/L   7    I/O: -38.5 L since admit (question accuracy)  NUTRITION - FOCUSED PHYSICAL EXAM: Flowsheet Row Most Recent Value  Orbital Region Severe depletion  Upper Arm Region Severe depletion  Thoracic and Lumbar Region Severe depletion  Buccal Region Moderate depletion  Temple Region Severe depletion  Clavicle Bone Region Severe depletion  Clavicle and  Acromion Bone Region Severe depletion  Scapular Bone Region Severe depletion  Dorsal Hand Severe depletion  Anterior Thigh Region Severe depletion  Posterior Calf Region Severe depletion  Edema (RD Assessment) Mild  [generalized non-pitting]  Hair Reviewed  Eyes Reviewed  Mouth Reviewed  Skin Reviewed  Nails Reviewed    EDUCATION NEEDS:  Education needs have been addressed  Skin:  Skin Assessment: Reviewed RN Assessment (no updated wounds noted since last admission)  Last BM:  12/13 type 7  Height:  Ht Readings from Last 1 Encounters:  11/12/23 6' 3 (1.905 m)   Weight:  Wt Readings from Last 10 Encounters:  12/16/23 61.2 kg  11/05/23 66.3 kg  09/30/23 66.2 kg  09/26/23 66.7 kg  09/17/23 66.7 kg  09/12/23 63 kg  08/30/23 63 kg  08/29/23 67 kg  08/26/23 67.5 kg  08/22/23 63.2 kg   Weight Change: 5 Kg (7.5%) loss in 1 month = clinically significant  Usual Body Weight: 140-150 lbs (63.6-68.2 Kg) dry weight per patient  Edema: generalized non-pitting  Ideal Body Weight:  89 kg   BMI:  Body mass index is 16.86 kg/m.  Estimated Nutritional Needs:  Kcal:  2100-2300 Protein:  120-150 Fluid:  >/=2100    Leverne Ruth, MS, RDN, LDN [place] See AMION for contact information Secure chat preferred

## 2023-12-16 NOTE — Progress Notes (Addendum)
 Physical Therapy Treatment Patient Details Name: Macy Lingenfelter MRN: [id number] DOB: 23-Nov-1955 Today's Date: 12/16/2023   History of Present Illness 68 y.o. male admitted 11/12/23 for abnormal renal labs. Found to have AKI and potential UTI. Foley catheter was inserted.  He has since been having hematuria, possibly trauma in the setting of Eliquis . Pt with worsening of chronic normocytic anemia. During workup, patient was Hemoccult positive, GI consulted. Hemoglobin continued to drift down requiring 1 unit PRBC via transfusion. CT imaging on admission was significant for right pleural effusion concerning for possible empyema. Pulmonology consulted for chest tube placement which was performed on 11/14 which was removed on 11/17.  Discharge complicated by unsafe discharge plan at this time. PMHx: PAF, chronic A-Fib, CHF, CAD s/p CABG, PAD, T2DM, HTN, HLD, chronic decubitus ulcer,  chronic anemia with heme positive stool, and chronic diarrhea. Of note, recent hospitalization 10/22-11/5 after fall sustaining L SDH with 4mm shift.    PT Comments  Due to resistance with mobilizing in prior sessions, utilized maxi move to transfer out of bed to chair. Once up in chair, pt able to transfer to standing with modA and ambulating 15 ft with a RW and chair follow. He is grossly weak from being essentially bed bound for several weeks due to refusal to participate in therapies. He also refuses to sit up in chair due to it being uncomfortable, despite Geo mat cushion. Pt would benefit from further acute PT to work on strengthening, balance, functional mobility, endurance.    If plan is discharge home, recommend the following: Assistance with cooking/housework;Assist for transportation;Help with stairs or ramp for entrance;Supervision due to cognitive status;Direct supervision/assist for medications management;A lot of help with bathing/dressing/bathroom;Direct supervision/assist for financial management;Two  people to help with walking and/or transfers   Can travel by private vehicle     No  Equipment Recommendations  Wheelchair cushion (measurements PT);Wheelchair (measurements PT);Hospital bed;BSC/3in1    Recommendations for Other Services       Precautions / Restrictions Precautions Precautions: Fall Recall of Precautions/Restrictions: Impaired Precaution/Restrictions Comments: low vision. HOH. hx TBI Restrictions Weight Bearing Restrictions Per Provider Order: No     Mobility  Bed Mobility Overal bed mobility: Needs Assistance Bed Mobility: Rolling Rolling: Min assist              Transfers Overall transfer level: Needs assistance Equipment used: Rolling walker (2 wheels), Ambulation equipment used Transfers: Sit to/from Stand Sit to Stand: Mod assist           General transfer comment: Maximove bed to chair. ModA to boost up to standing position from chair.    Ambulation/Gait Ambulation/Gait assistance: Min assist, +2 safety/equipment Gait Distance (Feet): 15 Feet Assistive device: Rolling walker (2 wheels) Gait Pattern/deviations: Step-through pattern, Decreased stride length Gait velocity: decreased Gait velocity interpretation: <1.31 ft/sec, indicative of household ambulator   General Gait Details: MinA for balance, chair follow utilized   Comptroller Bed    Modified Rankin (Stroke Patients Only)       Balance Overall balance assessment: Needs assistance Sitting-balance support: Feet supported Sitting balance-Leahy Scale: Fair     Standing balance support: Bilateral upper extremity supported, Reliant on assistive device for balance Standing balance-Leahy Scale: Poor                              Communication  Communication Communication: Impaired Factors Affecting Communication: Hearing impaired  Cognition Arousal: Alert Behavior During Therapy: Flat affect   PT - Cognitive  impairments: No family/caregiver present to determine baseline                       PT - Cognition Comments: Resistant to mobilize Following commands: Impaired Following commands impaired: Follows one step commands with increased time, Follows one step commands inconsistently    Cueing Cueing Techniques: Verbal cues, Tactile cues, Gestural cues  Exercises      General Comments        Pertinent Vitals/Pain Pain Assessment Pain Assessment: Faces Faces Pain Scale: No hurt    Home Living                          Prior Function            PT Goals (current goals can now be found in the care plan section) Acute Rehab PT Goals Patient Stated Goal: Did not state PT Goal Formulation: With patient Time For Goal Achievement: 12/30/23 Potential to Achieve Goals: Fair Progress towards PT goals: Progressing toward goals    Frequency    Min 2X/week      PT Plan      Co-evaluation              AM-PAC PT 6 Clicks Mobility   Outcome Measure  Help needed turning from your back to your side while in a flat bed without using bedrails?: A Little Help needed moving from lying on your back to sitting on the side of a flat bed without using bedrails?: A Little Help needed moving to and from a bed to a chair (including a wheelchair)?: A Lot Help needed standing up from a chair using your arms (e.g., wheelchair or bedside chair)?: A Lot Help needed to walk in hospital room?: A Little Help needed climbing 3-5 steps with a railing? : Total 6 Click Score: 14    End of Session Equipment Utilized During Treatment: Gait belt Activity Tolerance: Patient tolerated treatment well Patient left: with call bell/phone within reach;in bed;with bed alarm set Nurse Communication: Mobility status PT Visit Diagnosis: Unsteadiness on feet (R26.81);Other abnormalities of gait and mobility (R26.89);Muscle weakness (generalized) (M62.81);History of falling (Z91.81);Dizziness  and giddiness (R42);Difficulty in walking, not elsewhere classified (R26.2)     Time: 1100-1130 PT Time Calculation (min) (ACUTE ONLY): 30 min  Charges:    $Therapeutic Activity: 23-37 mins PT General Charges $$ ACUTE PT VISIT: 1 Visit                     Aleck Daring, PT, DPT Acute Rehabilitation Services Office [phone]    [name] 12/16/2023, 1:51 PM

## 2023-12-17 DIAGNOSIS — J90 Pleural effusion, not elsewhere classified: Secondary | ICD-10-CM | POA: Diagnosis not present

## 2023-12-17 DIAGNOSIS — R310 Gross hematuria: Secondary | ICD-10-CM | POA: Diagnosis not present

## 2023-12-17 DIAGNOSIS — N179 Acute kidney failure, unspecified: Secondary | ICD-10-CM | POA: Diagnosis not present

## 2023-12-17 DIAGNOSIS — R195 Other fecal abnormalities: Secondary | ICD-10-CM | POA: Diagnosis not present

## 2023-12-17 LAB — GLUCOSE, CAPILLARY
Glucose-Capillary: 164 mg/dL — ABNORMAL HIGH (ref 70–99)
Glucose-Capillary: 198 mg/dL — ABNORMAL HIGH (ref 70–99)
Glucose-Capillary: 187 mg/dL — ABNORMAL HIGH (ref 70–99)
Glucose-Capillary: 244 mg/dL — ABNORMAL HIGH (ref 70–99)

## 2023-12-17 LAB — FUNGUS CULTURE RESULT

## 2023-12-17 LAB — FUNGUS CULTURE WITH STAIN

## 2023-12-17 LAB — FUNGAL ORGANISM REFLEX

## 2023-12-17 MED ORDER — LACTATED RINGERS IV SOLN: INTRAVENOUS | Status: AC

## 2023-12-17 NOTE — TOC Progression Note (Signed)
 Transition of Care [facility]) - Progression Note    Patient Details  Name: [name] [name] MRN: [id number] Date of Birth: March 30, 1955  [facility]) CM/SW Contact  [name], LCSW Phone Number: 12/17/2023, 3:30 PM  Clinical Narrative:   CSW spoke with Christopher Ewell cap.  He was able to find medicaid pending #[id number], medicaid caseworker: Scarlett Brittle, [phone].  CSW spoke with Albino Glasser: her available bed now would be at [facility].  She took the above info and will start her process, hopeful pt could be admitted this week.  She would like to know if [name] was able to get POA.  Message left with [name].     Expected Discharge Plan: Skilled Nursing Facility Barriers to Discharge: Continued Medical Work up               Expected Discharge Plan and Services In-house Referral: Clinical Social Work   Post Acute Care Choice: Skilled Nursing Facility Living arrangements for the past 2 months: Single Family Home                                       Social Drivers of Health (SDOH) Interventions SDOH Screenings   Food Insecurity: No Food Insecurity (11/13/2023)  Recent Concern: Food Insecurity - Food Insecurity Present (08/18/2023)  Housing: Low Risk (11/13/2023)  Transportation Needs: No Transportation Needs (11/13/2023)  Utilities: Not At Risk (11/13/2023)  Alcohol Screen: Low Risk (08/19/2023)  Depression (PHQ2-9): Medium Risk (07/26/2023)  Financial Resource Strain: Low Risk (08/19/2023)  Physical Activity: Inactive (11/21/2022)  Social Connections: Socially Isolated (11/13/2023)  Stress: Stress Concern Present (11/21/2022)  Tobacco Use: High Risk (11/24/2023)  Health Literacy: Adequate Health Literacy (11/21/2022)    Readmission Risk Interventions    08/20/2023    4:20 PM  Readmission Risk Prevention Plan  Transportation Screening Complete  PCP or Specialist Appt within 3-5 Days Complete  HRI or Home Care Consult  Complete  Palliative Care Screening Not Applicable  Medication Review (RN Care Manager) Complete

## 2023-12-17 NOTE — Progress Notes (Signed)
 Mobility Specialist Progress Note:    12/17/23 1011  Mobility  Activity Ambulated with assistance (In room)  Level of Assistance Moderate assist, patient does 50-74% (+2 and chair follow)  Assistive Device Front wheel walker  Distance Ambulated (ft) 12 ft  Activity Response Tolerated well  Mobility Referral Yes  Mobility visit 1 Mobility  Mobility Specialist Start Time (ACUTE ONLY) [id number]  Mobility Specialist Stop Time (ACUTE ONLY) 1011  Mobility Specialist Time Calculation (min) (ACUTE ONLY) 15 min   Received pt in bed and agreeable to mobility with max encouragement. Pt required ModA STS from bed and chair follow for safety. Pt c/o pain, unspecified the location. Pt seemed unsteady. Pt had x1 seated rest break and returned to room via chair. Pt required ModA +2 STS from chair. Left pt in bed with alarm on. Personal belongings and call light within reach. All needs met.  Lavanda Pollack Mobility Specialist  Please contact via [employer] or  Rehab Office [phone]

## 2023-12-17 NOTE — Plan of Care (Signed)
   Problem: Education: Goal: Knowledge of General Education information will improve Description: Including pain rating scale, medication(s)/side effects and non-pharmacologic comfort measures Outcome: Progressing   Problem: Activity: Goal: Risk for activity intolerance will decrease Outcome: Progressing   Problem: Skin Integrity: Goal: Risk for impaired skin integrity will decrease Outcome: Progressing

## 2023-12-17 NOTE — Progress Notes (Signed)
 [name]                                                                               Axxel Gude, is a 68 y.o. male, DOB - 1955/09/15, FMW:[id number] Admit date - 11/12/2023    Outpatient Primary MD for the patient is [name], [name], PA-C  LOS - 34  days    Brief summary   [name] [name] is a 68 y.o. male with a history of atrial fibrillation with slow ventricular response, CAD, CABG x 4, aortic aneurysm s/p repair, CVA, diabetes mellitus type 2, heart failure with preserved EF. Patient presented secondary to abnormal metabolic panel and while in the emergency department found to have evidence of gross hematuria, anemia and AKI. Urology consulted. Patient started IV fluids. During workup, patient was Hemoccult positive, GI consulted. Hemoglobin continued to drift down requiring 1 unit PRBC via transfusion. CT imaging on admission was significant for right pleural effusion concerning for possible empyema. Pulmonology consulted for chest tube placement which was performed on 11/14 which was removed on 11/17.  Discharge complicated by unsafe discharge plan at this time.   Assessment & Plan    Assessment and Plan:  Gross hematuria with possible urinary tract infection. Probably related to Foley catheter trauma and complicated by Eliquis  use. CT abdomen pelvis significant for normal kidneys without hydronephrosis, nonobstructing stone versus vascular calcification of the upper pole left kidney.  Urology consulted and CBI started with resolution of hematuria.  Urine cultures growing 20,000 colonies of Staphylococcus hemolyticus. Patient also completed a course of antibiotics. Recommend finasteride  on discharge. No new events.     Acute blood loss anemia superimposed on chronic iron  deficiency anemia. Probably secondary to hematuria. S/p 1 unit of PRBC transfusion and repeat hemoglobin has improved to 8.2 to 8.4   AKI Slight worsening of creatinine from  0.8 to 1.2.  Suspect from poor oral intake.  Started him on IV fluids. Recheck labs in the morning.    Heme positive stools In the setting of Eliquis  use.  GI consulted and patient underwent EGD showing nonerosive gastritis and multiple nonbleeding duodenal ulcers.  Colonoscopy deferred secondary to patient refusing the bowel prep. Bowel movements normal.     Paroxysmal atrial fibrillation Patient not on rate controlling medications secondary to slow ventricular response. Resume Eliquis .     Type 2 diabetes mellitus Continue with sliding scale insulin . Hemoglobin A1c of 8.3 CBG (last 3)  Recent Labs    12/17/23 0602 12/17/23 1122 12/17/23 1612  GLUCAP 164* 198* 187*   Liberalized diet to regular as he has poor appetite.  His appetite seems to be improving.  Will increase the SSI to moderate scale if his cbg's continue to increase.     History of coronary artery disease status post CABG   History of aortic aneurysm which has been stable.   Peripheral vascular disease   Right pleural effusion Noted on CT imaging with evidence of moderate-sized collection.  Concern for possible empyema secondary to gas seen on imaging.  Last thoracentesis was from February 2025, which was also right-sided and was secondary to heart failure.  Patient is completely asymptomatic.  Pulmonology consulted  and placed a right-sided chest tube on 11/14 and have applied lytics for management of loculated effusion. Chest tube successfully removed on 11/17. Antibiotics completed. Patient is on RA without any chest pain or sob.    Cognitive dysfunction Delirium Noted. History of severe delirium during prior hospitalization. Improved with adjustment of antipsychotic regimen. Decreased dose of Seroquel  to 25 mg  as pt was somnolent.  With the decrease of the dose to 25 mg, pt is more alert and conversing.  Intermittently refuses to get out of bed.    Poor appetite Nutrition consulted.  Added  megace .   Hypertension:  BP parameters are slightly elevated , prn hydralazine  on board.  IF BP parameters remain elevated, recommend starting him on anti hypertensive.    Estimated body mass index is 16.92 kg/m as calculated from the following:   Height as of this encounter: 6' 3 (1.905 m).   Weight as of this encounter: 61.4 kg.  Code Status: Full code DVT Prophylaxis:  Place and maintain sequential compression device Start: 11/20/23 1505 SCDs Start: 11/13/23 0733 apixaban  (ELIQUIS ) tablet 5 mg   Level of Care: Level of care: Med-Surg Family Communication: None at bedside, discussed the plan with son over the weekend.   Disposition Plan:     Remains inpatient appropriate: Pending placement  Procedures:  EGD Chest tube placement  Consultants:   Gastroenterology Urology PCCM Psychiatry  Antimicrobials:   Anti-infectives (From admission, onward)    Start     Dose/Rate Route Frequency Ordered Stop   11/16/23 1315  sulfamethoxazole -trimethoprim  (BACTRIM  DS) 800-160 MG per tablet 1 tablet        1 tablet Oral Every 12 hours 11/16/23 1202 11/22/23 2203   11/14/23 1630  metroNIDAZOLE  (FLAGYL ) IVPB 500 mg  Status:  Discontinued        500 mg 100 mL/hr over 60 Minutes Intravenous Every 8 hours 11/14/23 1534 11/20/23 1606   11/14/23 0200  cefTRIAXone  (ROCEPHIN ) 1 g in sodium chloride  0.9 % 100 mL IVPB  Status:  Discontinued        1 g 200 mL/hr over 30 Minutes Intravenous Every 24 hours 11/13/23 0821 11/20/23 1606   11/13/23 0215  cefTRIAXone  (ROCEPHIN ) 1 g in sodium chloride  0.9 % 100 mL IVPB        1 g 200 mL/hr over 30 Minutes Intravenous  Once 11/13/23 0208 11/13/23 0411        Medications  Scheduled Meds:  apixaban   5 mg Oral BID   aspirin  EC  81 mg Oral Daily   Chlorhexidine  Gluconate Cloth  6 each Topical Daily   collagenase   1 Application Topical Daily   feeding supplement (GLUCERNA SHAKE)  237 mL Oral TID BM   finasteride   5 mg Oral Daily   insulin   aspart  0-5 Units Subcutaneous QHS   insulin  aspart  0-9 Units Subcutaneous TID WC   megestrol   40 mg Oral Daily   mirtazapine   15 mg Oral QHS   multivitamin with minerals  1 tablet Oral Daily   nutrition supplement (JUVEN)  1 packet Oral BID BM   PARoxetine   20 mg Oral Daily   polyethylene glycol powder  119 g Oral Once   QUEtiapine   25 mg Oral QHS   Continuous Infusions:  lactated ringers      sodium chloride  irrigation     PRN Meds:.acetaminophen , bisacodyl , QUEtiapine  **OR** haloperidol  lactate, hydrALAZINE , ondansetron  (ZOFRAN ) IV, zinc  oxide    Subjective:   [name] [name] was seen and examined today.  No new events.   Objective:   Vitals:   12/17/23 0500 12/17/23 0828 12/17/23 1413 12/17/23 1415  BP:  133/65 (!) 170/65 (!) 167/86  Pulse:  (!) 57 (!) 47 (!) 53  Resp:      Temp:  98.7 F (37.1 C) (!) 97.5 F (36.4 C)   TempSrc:  Oral Oral   SpO2:  99% 98% 98%  Weight: 61.4 kg     Height:        Intake/Output Summary (Last 24 hours) at 12/17/2023 1753 Last data filed at 12/17/2023 1623 Gross per 24 hour  Intake 480 ml  Output 1200 ml  Net -720 ml   Filed Weights   12/15/23 0500 12/16/23 0600 12/17/23 0500  Weight: 61.4 kg 61.2 kg 61.4 kg     Exam General exam: Appears calm and comfortable  Respiratory system: Clear to auscultation. Respiratory effort normal. Cardiovascular system: S1 & S2 heard, RRR. Gastrointestinal system: Abdomen is soft bs+ Central nervous system: Alert and oriented.  Extremities: no pedal edema.  Skin: No rashes, Psychiatry:  Mood & affect appropriate.       Data Reviewed:  I have personally reviewed following labs and imaging studies   CBC Lab Results  Component Value Date   WBC 5.4 12/16/2023   RBC 2.84 (L) 12/16/2023   HGB 8.4 (L) 12/16/2023   HCT 26.4 (L) 12/16/2023   MCV 93.0 12/16/2023   MCH 29.6 12/16/2023   PLT 217 12/16/2023   MCHC 31.8 12/16/2023   RDW 17.4 (H) 12/16/2023   LYMPHSABS 0.8 12/16/2023    MONOABS 0.4 12/16/2023   EOSABS 0.1 12/16/2023   BASOSABS 0.0 12/16/2023     Last metabolic panel Lab Results  Component Value Date   NA 136 12/16/2023   K 4.7 12/16/2023   CL 100 12/16/2023   CO2 30 12/16/2023   BUN 30 (H) 12/16/2023   CREATININE 1.28 (H) 12/16/2023   GLUCOSE 234 (H) 12/16/2023   GFRNONAA >60 12/16/2023   GFRAA >60 06/09/2018   CALCIUM  8.3 (L) 12/16/2023   PHOS 3.5 11/24/2023   PROT 6.2 (L) 11/24/2023   ALBUMIN 2.2 (L) 11/24/2023   LABGLOB 3.8 08/26/2023   BILITOT 1.0 11/24/2023   ALKPHOS 132 (H) 11/24/2023   AST 17 11/24/2023   ALT 7 11/24/2023   ANIONGAP 6 12/16/2023    CBG (last 3)  Recent Labs    12/17/23 0602 12/17/23 1122 12/17/23 1612  GLUCAP 164* 198* 187*      Coagulation Profile: No results for input(s): INR, PROTIME in the last 168 hours.   Radiology Studies: No results found.     Elgie Butter M.D. [name] 12/17/2023, 5:53 PM  Available via Epic secure chat 7am-7pm After 7 pm, please refer to night coverage provider listed on amion.

## 2023-12-17 NOTE — Plan of Care (Signed)
   Problem: Activity: Goal: Risk for activity intolerance will decrease Outcome: Progressing   Problem: Safety: Goal: Ability to remain free from injury will improve Outcome: Progressing   Problem: Skin Integrity: Goal: Risk for impaired skin integrity will decrease Outcome: Progressing

## 2023-12-18 DIAGNOSIS — R310 Gross hematuria: Secondary | ICD-10-CM | POA: Diagnosis not present

## 2023-12-18 LAB — BASIC METABOLIC PANEL WITH GFR
Anion gap: 6 (ref 5–15)
BUN: 32 mg/dL — ABNORMAL HIGH (ref 8–23)
CO2: 27 mmol/L (ref 22–32)
Calcium: 8.5 mg/dL — ABNORMAL LOW (ref 8.9–10.3)
Chloride: 103 mmol/L (ref 98–111)
Creatinine, Ser: 0.81 mg/dL (ref 0.61–1.24)
GFR, Estimated: >60 mL/min (ref >60)
Glucose, Bld: 189 mg/dL — ABNORMAL HIGH (ref 70–99)
Potassium: 4.5 mmol/L (ref 3.5–5.1)
Sodium: 137 mmol/L (ref 135–145)

## 2023-12-18 LAB — GLUCOSE, CAPILLARY
Glucose-Capillary: 242 mg/dL — ABNORMAL HIGH (ref 70–99)
Glucose-Capillary: 161 mg/dL — ABNORMAL HIGH (ref 70–99)
Glucose-Capillary: 234 mg/dL — ABNORMAL HIGH (ref 70–99)
Glucose-Capillary: 192 mg/dL — ABNORMAL HIGH (ref 70–99)

## 2023-12-18 MED ORDER — ATORVASTATIN CALCIUM 40 MG PO TABS
40.0000 mg | ORAL_TABLET | Freq: Every day | ORAL | Status: DC
  Administered 2023-12-18 – 2023-12-24 (×7): 40 mg via ORAL
  Filled 2023-12-18 (×7): qty 1

## 2023-12-18 NOTE — Progress Notes (Signed)
 PROGRESS NOTE    [name] [name]  FMW:[id number] DOB: 04/02/1955 DOA: 11/12/2023 PCP: [name] HERO, PA-C  Subjective: Patient reports feeling okay, no acute complaints at the time of my visit   Hospital Course: [name] [name] is a 68 y.o. male with a history of atrial fibrillation with slow ventricular response, CAD, CABG x 4, aortic aneurysm s/p repair, CVA, diabetes mellitus type 2, heart failure with preserved EF. Patient presented secondary to abnormal metabolic panel and while in the emergency department found to have evidence of gross hematuria, anemia and AKI. Urology consulted. Patient started IV fluids. During workup, patient was Hemoccult positive, GI consulted. Hemoglobin continued to drift down requiring 1 unit PRBC via transfusion. CT imaging on admission was significant for right pleural effusion concerning for possible empyema. Pulmonology consulted for chest tube placement which was performed on 11/14 which was removed on 11/17.  Discharge complicated by unsafe discharge plan at this time.   Assessment and Plan:  Gross hematuria with possible urinary tract infection. Probably related to Foley catheter trauma and complicated by Eliquis  use. CT abdomen pelvis showed normal kidneys without hydronephrosis, nonobstructing stone versus vascular calcification of the upper pole left kidney.  Urology consulted and CBI started with resolution of hematuria.  Urine cultures growing 20,000 colonies of Staphylococcus hemolyticus. Patient also completed a course of antibiotics. Continue finasteride     Acute blood loss anemia superimposed on chronic iron  deficiency anemia. Probably secondary to hematuria. S/p 1 unit of PRBC transfusion and Hb has been stable in the 8 range      AKI - resolved, Cr back to 0.8    Heme positive stools In the setting of Eliquis  use.  GI consulted and patient underwent EGD showing nonerosive gastritis and multiple nonbleeding duodenal ulcers.   Colonoscopy deferred secondary to patient refusing the bowel prep. Bowel movements normal.     Paroxysmal atrial fibrillation Patient not on rate controlling medications secondary to slow ventricular response. Continue eliquis     Type 2 diabetes mellitus Continue with sliding scale insulin . Hemoglobin A1c of 8.3 Liberalized diet to regular as he has poor appetite.  His appetite seems to be improving.  - continue SSI   History of coronary artery disease status post CABG, PVD - continue aspirin , eliquis   - will restart atorvastatin     History of aortic aneurysm which has been stable.   Right pleural effusion Noted on CT imaging with evidence of moderate-sized collection.  Concern for possible empyema secondary to gas seen on imaging.  Last thoracentesis was from February 2025, which was also right-sided and was secondary to heart failure. Pulmonology consulted and placed a right-sided chest tube on 11/14 and have applied lytics for management of loculated effusion. Chest tube successfully removed on 11/17. Antibiotics completed. Patient is on RA without any chest pain or sob.    Cognitive dysfunction Delirium Noted. History of severe delirium during prior hospitalization. Improved with adjustment of antipsychotic regimen. Decreased dose of Seroquel  to 25 mg  as pt was somnolent.  With the decrease of the dose to 25 mg, pt is more alert and conversing.  Intermittently refuses to get out of bed.    Hypertension:  BP parameters are slightly elevated , prn hydralazine  on board.  IF BP parameters remain elevated, recommend starting him on anti hypertensive.      DVT prophylaxis: Place and maintain sequential compression device Start: 11/20/23 1505 SCDs Start: 11/13/23 0733 apixaban  (ELIQUIS ) tablet 5 mg     Code Status: Full Code  Disposition Plan: TBD Reason for continuing need for hospitalization: pending SNF  Objective: Vitals:   12/17/23 1415 12/17/23 1932 12/18/23 0425  12/18/23 0717  BP: (!) 167/86 (!) 168/73 133/67 (!) 167/77  Pulse: (!) 53 (!) 53 (!) 53   Resp:  18 18 17   Temp:  98.4 F (36.9 C) 98.1 F (36.7 C) 98.2 F (36.8 C)  TempSrc:  Oral Oral Oral  SpO2: 98% 96% 98% 98%  Weight:      Height:        Intake/Output Summary (Last 24 hours) at 12/18/2023 1505 Last data filed at 12/18/2023 0900 Gross per 24 hour  Intake 865.72 ml  Output 1350 ml  Net -484.28 ml   Filed Weights   12/15/23 0500 12/16/23 0600 12/17/23 0500  Weight: 61.4 kg 61.2 kg 61.4 kg    Examination:  Physical Exam Vitals and nursing note reviewed.  Constitutional:      General: He is not in acute distress. Cardiovascular:     Rate and Rhythm: Normal rate.  Pulmonary:     Effort: No respiratory distress.  Abdominal:     General: There is no distension.  Musculoskeletal:     Right lower leg: No edema.     Left lower leg: No edema.     Data Reviewed: I have personally reviewed following labs and imaging studies  CBC: Recent Labs  Lab 12/16/23 2006  WBC 5.4  NEUTROABS 4.0  HGB 8.4*  HCT 26.4*  MCV 93.0  PLT 217   Basic Metabolic Panel: Recent Labs  Lab 12/16/23 2006 12/18/23 1150  NA 136 137  K 4.7 4.5  CL 100 103  CO2 30 27  GLUCOSE 234* 189*  BUN 30* 32*  CREATININE 1.28* 0.81  CALCIUM  8.3* 8.5*   GFR: Estimated Creatinine Clearance: 73.7 mL/min (by C-G formula based on SCr of 0.81 mg/dL). Liver Function Tests: No results for input(s): AST, ALT, ALKPHOS, BILITOT, PROT, ALBUMIN in the last 168 hours. No results for input(s): LIPASE, AMYLASE in the last 168 hours. No results for input(s): AMMONIA in the last 168 hours. Coagulation Profile: No results for input(s): INR, PROTIME in the last 168 hours. Cardiac Enzymes: No results for input(s): CKTOTAL, CKMB, CKMBINDEX, TROPONINI in the last 168 hours. ProBNP, BNP (last 5 results) Recent Labs    02/01/23 1625 02/06/23 0255 03/01/23 1157 08/18/23 1610  08/26/23 1107 09/12/23 1200  PROBNP  --   --  399.0* 3,796.0*  --   --   BNP 457.8* 97.3  --   --  190.6* 184.1*   HbA1C: No results for input(s): HGBA1C in the last 72 hours. CBG: Recent Labs  Lab 12/17/23 1122 12/17/23 1612 12/17/23 2128 12/18/23 0623 12/18/23 1124  GLUCAP 198* 187* 244* 242* 161*   Lipid Profile: No results for input(s): CHOL, HDL, LDLCALC, TRIG, CHOLHDL, LDLDIRECT in the last 72 hours. Thyroid  Function Tests: No results for input(s): TSH, T4TOTAL, FREET4, T3FREE, THYROIDAB in the last 72 hours. Anemia Panel: No results for input(s): VITAMINB12, FOLATE, FERRITIN, TIBC, IRON , RETICCTPCT in the last 72 hours. Sepsis Labs: No results for input(s): PROCALCITON, LATICACIDVEN in the last 168 hours.  No results found for this or any previous visit (from the past 240 hours).   Radiology Studies: No results found.  Scheduled Meds:  apixaban   5 mg Oral BID   aspirin  EC  81 mg Oral Daily   Chlorhexidine  Gluconate Cloth  6 each Topical Daily   collagenase   1 Application Topical Daily  feeding supplement (GLUCERNA SHAKE)  237 mL Oral TID BM   finasteride   5 mg Oral Daily   insulin  aspart  0-5 Units Subcutaneous QHS   insulin  aspart  0-9 Units Subcutaneous TID WC   megestrol   40 mg Oral Daily   mirtazapine   15 mg Oral QHS   multivitamin with minerals  1 tablet Oral Daily   nutrition supplement (JUVEN)  1 packet Oral BID BM   PARoxetine   20 mg Oral Daily   polyethylene glycol powder  119 g Oral Once   QUEtiapine   25 mg Oral QHS   Continuous Infusions:  lactated ringers  75 mL/hr at 12/18/23 0957   sodium chloride  irrigation       LOS: 35 days   Time spent: 35 minutes  Casimer Dare, MD  [facility]  12/18/2023, 3:05 PM

## 2023-12-18 NOTE — TOC Progression Note (Signed)
 Transition of Care [facility]) - Progression Note    Patient Details  Name: [name] [name] MRN: [id number] Date of Birth: [birth year]  [facility] of [facility]) CM/SW Contact  [name], LCSW Phone Number: 12/18/2023, 3:49 PM  Clinical Narrative:   Updated [name] regarding potential bed at [facility].  He will talk with pt about this tonight.  He does not have POA yet but has been in touch with lawyer and can pursue at any time.     Expected Discharge Plan: Skilled Nursing Facility Barriers to Discharge: Continued Medical Work up               Expected Discharge Plan and Services In-house Referral: Clinical Social Work   Post Acute Care Choice: Skilled Nursing Facility Living arrangements for the past 2 months: Single Family Home                                       Social Drivers of Health (SDOH) Interventions SDOH Screenings   Food Insecurity: No Food Insecurity (11/13/2023)  Recent Concern: Food Insecurity - Food Insecurity Present (08/18/2023)  Housing: Low Risk (11/13/2023)  Transportation Needs: No Transportation Needs (11/13/2023)  Utilities: Not At Risk (11/13/2023)  Alcohol Screen: Low Risk (08/19/2023)  Depression (PHQ2-9): Medium Risk (07/26/2023)  Financial Resource Strain: Low Risk (08/19/2023)  Physical Activity: Inactive (11/21/2022)  Social Connections: Socially Isolated (11/13/2023)  Stress: Stress Concern Present (11/21/2022)  Tobacco Use: High Risk (11/24/2023)  Health Literacy: Adequate Health Literacy (11/21/2022)    Readmission Risk Interventions    08/20/2023    4:20 PM  Readmission Risk Prevention Plan  Transportation Screening Complete  PCP or Specialist Appt within 3-5 Days Complete  HRI or Home Care Consult Complete  Palliative Care Screening Not Applicable  Medication Review (RN Care Manager) Complete

## 2023-12-18 NOTE — Inpatient Diabetes Management (Signed)
 Inpatient Diabetes Program Recommendations  AACE/ADA: New Consensus Statement on Inpatient Glycemic Control   Target Ranges:  Prepandial:   less than 140 mg/dL      Peak postprandial:   less than 180 mg/dL (1-2 hours)      Critically ill patients:  140 - 180 mg/dL   Lab Results  Component Value Date   GLUCAP 161 (H) 12/18/2023   HGBA1C 8.3 (H) 11/15/2023    Latest Reference Range & Units 12/17/23 06:02 12/17/23 11:22 12/17/23 16:12 12/17/23 21:28 12/18/23 06:23 12/18/23 11:24  Glucose-Capillary 70 - 99 mg/dL 835 (H) 801 (H) 812 (H) 244 (H) 242 (H) 161 (H)   Review of Glycemic Control  Diabetes history: DM2   Outpatient Diabetes medications:  Glipizide  5 mg Qam, 2.5 mg Qpm Januvia  100 mg Daily    Current orders for Inpatient glycemic control: Novolog  0-9 units TID + 0-5 units at bedtime    Inpatient Diabetes Program Recommendations:    Please consider adding low dose basal insulin , Lantus  8 units daily.   Thanks,  Lavanda Search, RN, MSN, [facility]  Inpatient Diabetes Coordinator  Pager [phone] (8a-5p)

## 2023-12-18 NOTE — Progress Notes (Signed)
 Occupational Therapy Treatment Patient Details Name: [name] [name] MRN: [id number] DOB: 03-May-1955 Today's Date: 12/18/2023   History of present illness 68 y.o. male admitted 11/12/23 for abnormal renal labs. Found to have AKI and potential UTI. Foley catheter was inserted.  He has since been having hematuria, possibly trauma in the setting of Eliquis . Pt with worsening of chronic normocytic anemia. During workup, patient was Hemoccult positive, GI consulted. Hemoglobin continued to drift down requiring 1 unit PRBC via transfusion. CT imaging on admission was significant for right pleural effusion concerning for possible empyema. Pulmonology consulted for chest tube placement which was performed on 11/14 which was removed on 11/17.  Discharge complicated by unsafe discharge plan at this time. PMHx: PAF, chronic A-Fib, CHF, CAD s/p CABG, PAD, T2DM, HTN, HLD, chronic decubitus ulcer,  chronic anemia with heme positive stool, and chronic diarrhea. Of note, recent hospitalization 10/22-11/5 after fall sustaining L SDH with 4mm shift.   OT comments  Pt is progressing towards OT functional goals. Focus of session on progressing functional mobility and increasing engagement in ADL tasks. Pt required up to Mod A for functional mobility this session, ambulated about ~30 this session. OT monitored and adjusted tx session task in response to Pt tolerance. Pt required up to Max A for LB ADLs, and up to Min A for UB dressing and feeding. Pt continues to benefit from acute skilled OT services. Continue per POC.       If plan is discharge home, recommend the following:  A little help with bathing/dressing/bathroom;A lot of help with walking and/or transfers;Direct supervision/assist for financial management;Direct supervision/assist for medications management;Assistance with cooking/housework;Assist for transportation;Help with stairs or ramp for entrance   Equipment Recommendations  None recommended by OT     Recommendations for Other Services      Precautions / Restrictions Precautions Precautions: Fall Recall of Precautions/Restrictions: Impaired Precaution/Restrictions Comments: low vision. HOH. hx TBI Restrictions Weight Bearing Restrictions Per Provider Order: No       Mobility Bed Mobility Overal bed mobility: Needs Assistance Bed Mobility: Supine to Sit, Sit to Supine     Supine to sit: Min assist Sit to supine: Mod assist   General bed mobility comments: Pt greeted in bed, Max encouragement and distraction utilized to support Pt in coming to EOB. Min A to banage BLE and elevate trunk from bed. Mod A to return to supine.    Transfers Overall transfer level: Needs assistance Equipment used: Rolling walker (2 wheels) Transfers: Sit to/from Stand, Bed to chair/wheelchair/BSC Sit to Stand: Mod assist, From elevated surface     Step pivot transfers: Min assist, +2 safety/equipment     General transfer comment: Pt agreeable to OOB transfer with redirection to other tasks during mobility and max encouragement. Pt benefits from reasons to get out of bed (i.e. tell him he has to get up to change sheets). Mod A to rise from bed slightly elevated. Min A in standing with chair follow as Pt with decreased endurance. Pt transferred ~20 ft before requiring rest break in recliner. Declined to stay in recliner, mobilized ~10 additional feet to return to bed.     Balance Overall balance assessment: Needs assistance Sitting-balance support: No upper extremity supported, Feet supported Sitting balance-Leahy Scale: Fair     Standing balance support: Bilateral upper extremity supported, During functional activity, Reliant on assistive device for balance Standing balance-Leahy Scale: Poor Standing balance comment: dependent on RW and external support  ADL either performed or assessed with clinical judgement   ADL Overall ADL's : Needs  assistance/impaired Eating/Feeding: Set up Eating/Feeding Details (indicate cue type and reason): Set-up to open cookie packaging             Upper Body Dressing : Minimal assistance;Bed level Upper Body Dressing Details (indicate cue type and reason): Min A to don gown. Pt found with spaghetti on gown Lower Body Dressing: Maximal assistance;Bed level Lower Body Dressing Details (indicate cue type and reason): Max A don socks                    Extremity/Trunk Assessment Upper Extremity Assessment Upper Extremity Assessment: Generalized weakness            Vision       Perception     Praxis     Communication Communication Communication: Impaired Factors Affecting Communication: Hearing impaired   Cognition Arousal: Alert Behavior During Therapy: Flat affect Cognition: Cognition impaired   Orientation impairments: Situation Awareness: Online awareness impaired Memory impairment (select all impairments): Short-term memory, Working civil service fast streamer, Conservation officer, historic buildings Attention impairment (select first level of impairment): Sustained attention Executive functioning impairment (select all impairments): Reasoning, Problem solving, Sequencing, Organization OT - Cognition Comments: Decreased executive functioning skills and insight into deficits. Therapist re-stated verbatim what Pt said immediately after it was said, and Pt responded don't put words in my mouth.                 Following commands: Impaired Following commands impaired: Follows one step commands with increased time, Follows one step commands inconsistently      Cueing   Cueing Techniques: Verbal cues, Visual cues, Tactile cues  Exercises      Shoulder Instructions       General Comments Pt reported fatigue after initial ambulation.    Pertinent Vitals/ Pain       Pain Assessment Pain Assessment: Faces Faces Pain Scale: Hurts a little bit Pain Location: buttocks Pain  Descriptors / Indicators: Discomfort Pain Intervention(s): Limited activity within patient's tolerance, Monitored during session, Repositioned  Home Living                                          Prior Functioning/Environment              Frequency  Min 2X/week        Progress Toward Goals  OT Goals(current goals can now be found in the care plan section)  Progress towards OT goals: Progressing toward goals     Plan      Co-evaluation                 AM-PAC OT 6 Clicks Daily Activity     Outcome Measure   Help from another person eating meals?: A Little Help from another person taking care of personal grooming?: A Little Help from another person toileting, which includes using toliet, bedpan, or urinal?: A Lot Help from another person bathing (including washing, rinsing, drying)?: A Little Help from another person to put on and taking off regular upper body clothing?: A Little Help from another person to put on and taking off regular lower body clothing?: A Lot 6 Click Score: 16    End of Session Equipment Utilized During Treatment: Gait belt;Rolling walker (2 wheels)  OT Visit Diagnosis: Other abnormalities of gait and mobility (R26.89);Muscle  weakness (generalized) (M62.81);Other symptoms and signs involving cognitive function   Activity Tolerance Patient tolerated treatment well   Patient Left in bed;with call bell/phone within reach;with bed alarm set   Nurse Communication Mobility status        Time: 8551-8482 OT Time Calculation (min): 29 min  Charges: OT General Charges $OT Visit: 1 Visit OT Treatments $Therapeutic Activity: 23-37 mins  Maurilio CROME, OTR/L.  [facility] Acute Rehabilitation  Office: [phone]   Maurilio PARAS Hamdan Toscano 12/18/2023, 4:32 PM

## 2023-12-18 NOTE — Plan of Care (Signed)
   Problem: Education: Goal: Knowledge of General Education information will improve Description: Including pain rating scale, medication(s)/side effects and non-pharmacologic comfort measures Outcome: Progressing   Problem: Activity: Goal: Risk for activity intolerance will decrease Outcome: Progressing   Problem: Nutrition: Goal: Adequate nutrition will be maintained Outcome: Progressing

## 2023-12-19 ENCOUNTER — Ambulatory Visit

## 2023-12-19 DIAGNOSIS — R310 Gross hematuria: Secondary | ICD-10-CM | POA: Diagnosis not present

## 2023-12-19 LAB — GLUCOSE, CAPILLARY
Glucose-Capillary: 167 mg/dL — ABNORMAL HIGH (ref 70–99)
Glucose-Capillary: 174 mg/dL — ABNORMAL HIGH (ref 70–99)
Glucose-Capillary: 222 mg/dL — ABNORMAL HIGH (ref 70–99)
Glucose-Capillary: 258 mg/dL — ABNORMAL HIGH (ref 70–99)
Glucose-Capillary: 184 mg/dL — ABNORMAL HIGH (ref 70–99)

## 2023-12-19 MED ORDER — LOSARTAN POTASSIUM 50 MG PO TABS
50.0000 mg | ORAL_TABLET | Freq: Every day | ORAL | Status: DC
  Administered 2023-12-19 – 2023-12-20 (×2): 50 mg via ORAL
  Filled 2023-12-19 (×2): qty 1

## 2023-12-19 NOTE — Progress Notes (Signed)
 PROGRESS NOTE    [name] [name]  FMW:[id number] DOB: Feb 07, 1955 DOA: 11/12/2023 PCP: [name] HERO, PA-C  Subjective: No overnight events. No acute complaints     Hospital Course: [name] [name] is a 68 y.o. male with a history of atrial fibrillation with slow ventricular response, CAD, CABG x 4, aortic aneurysm s/p repair, CVA, diabetes mellitus type 2, heart failure with preserved EF. Patient presented secondary to abnormal metabolic panel and while in the emergency department found to have evidence of gross hematuria, anemia and AKI. Urology consulted. Patient started IV fluids. During workup, patient was Hemoccult positive, GI consulted. Hemoglobin continued to drift down requiring 1 unit PRBC via transfusion. CT imaging on admission was significant for right pleural effusion concerning for possible empyema. Pulmonology consulted for chest tube placement which was performed on 11/14 which was removed on 11/17.  Discharge complicated by unsafe discharge plan at this time.   Assessment and Plan:  Gross hematuria with possible urinary tract infection. Probably related to Foley catheter trauma and complicated by Eliquis  use. CT abdomen pelvis showed normal kidneys without hydronephrosis, nonobstructing stone versus vascular calcification of the upper pole left kidney.  Urology consulted and CBI started with resolution of hematuria.  Urine cultures growing 20,000 colonies of Staphylococcus hemolyticus. Patient also completed a course of antibiotics. Continue finasteride     Acute blood loss anemia superimposed on chronic iron  deficiency anemia. Probably secondary to hematuria. S/p 1 unit of PRBC transfusion and Hb has been stable in the 8 range    AKI - resolved, Cr back to baseline    Heme positive stools In the setting of Eliquis  use.  GI consulted and patient underwent EGD showing nonerosive gastritis and multiple nonbleeding duodenal ulcers.  Colonoscopy deferred  secondary to patient refusing the bowel prep. Bowel movements normal.     Paroxysmal atrial fibrillation Patient not on rate controlling medications secondary to slow ventricular response. Continue eliquis     Type 2 diabetes mellitus Continue with sliding scale insulin . Hemoglobin A1c of 8.3 - appetite improved, diet changed to diabetic    History of coronary artery disease status post CABG, PVD - continue aspirin , eliquis , atorvastatin      History of aortic aneurysm which has been stable.   Right pleural effusion Noted on CT imaging with evidence of moderate-sized collection.  Concern for possible empyema secondary to gas seen on imaging.  Last thoracentesis was from February 2025, which was also right-sided and was secondary to heart failure. Pulmonology consulted and placed a right-sided chest tube on 11/14 and have applied lytics for management of loculated effusion. Chest tube successfully removed on 11/17. Antibiotics completed. Patient is on RA without any chest pain or sob.    Cognitive dysfunction Delirium Noted. History of severe delirium during prior hospitalization. Improved with adjustment of antipsychotic regimen. Decreased dose of Seroquel  to 25 mg, patient not more awake and alert  Intermittently refuses to get out of bed.    Hypertension:  BP parameters are slightly elevated , prn hydralazine  on board.  - BP has been persistently elevated, will restart home med losartan  at 50 mg (takes 100 mg), adjust as needed and add on amlodipine  as needed     DVT prophylaxis: Place and maintain sequential compression device Start: 11/20/23 1505 SCDs Start: 11/13/23 0733 apixaban  (ELIQUIS ) tablet 5 mg     Code Status: Full Code Disposition Plan: SNF Reason for continuing need for hospitalization: Dispo planning, med ready   Objective: Vitals:   12/18/23 1615 12/18/23 1939  12/19/23 0354 12/19/23 0805  BP: (!) 169/80 (!) 163/74 (!) 184/90 (!) 159/81  Pulse: (!) 57 (!) 56  67 67  Resp: 15 17  17   Temp: 98.4 F (36.9 C) 98.8 F (37.1 C) 99.1 F (37.3 C) 98.4 F (36.9 C)  TempSrc: Oral   Oral  SpO2: 100% 98% 100% 100%  Weight:      Height:        Intake/Output Summary (Last 24 hours) at 12/19/2023 1515 Last data filed at 12/19/2023 0814 Gross per 24 hour  Intake 360 ml  Output 1700 ml  Net -1340 ml   Filed Weights   12/15/23 0500 12/16/23 0600 12/17/23 0500  Weight: 61.4 kg 61.2 kg 61.4 kg    Examination:  Physical Exam Vitals and nursing note reviewed.  Constitutional:      General: He is not in acute distress. Cardiovascular:     Rate and Rhythm: Normal rate.  Pulmonary:     Effort: No respiratory distress.  Abdominal:     General: There is no distension.  Musculoskeletal:     Right lower leg: No edema.     Left lower leg: No edema.     Data Reviewed: I have personally reviewed following labs and imaging studies  CBC: Recent Labs  Lab 12/16/23 2006  WBC 5.4  NEUTROABS 4.0  HGB 8.4*  HCT 26.4*  MCV 93.0  PLT 217   Basic Metabolic Panel: Recent Labs  Lab 12/16/23 2006 12/18/23 1150  NA 136 137  K 4.7 4.5  CL 100 103  CO2 30 27  GLUCOSE 234* 189*  BUN 30* 32*  CREATININE 1.28* 0.81  CALCIUM  8.3* 8.5*   GFR: Estimated Creatinine Clearance: 73.7 mL/min (by C-G formula based on SCr of 0.81 mg/dL). Liver Function Tests: No results for input(s): AST, ALT, ALKPHOS, BILITOT, PROT, ALBUMIN in the last 168 hours. No results for input(s): LIPASE, AMYLASE in the last 168 hours. No results for input(s): AMMONIA in the last 168 hours. Coagulation Profile: No results for input(s): INR, PROTIME in the last 168 hours. Cardiac Enzymes: No results for input(s): CKTOTAL, CKMB, CKMBINDEX, TROPONINI in the last 168 hours. ProBNP, BNP (last 5 results) Recent Labs    02/01/23 1625 02/06/23 0255 03/01/23 1157 08/18/23 1610 08/26/23 1107 09/12/23 1200  PROBNP  --   --  399.0* 3,796.0*  --   --    BNP 457.8* 97.3  --   --  190.6* 184.1*   HbA1C: No results for input(s): HGBA1C in the last 72 hours. CBG: Recent Labs  Lab 12/18/23 1642 12/18/23 2059 12/19/23 0605 12/19/23 0822 12/19/23 1143  GLUCAP 234* 192* 167* 174* 222*   Lipid Profile: No results for input(s): CHOL, HDL, LDLCALC, TRIG, CHOLHDL, LDLDIRECT in the last 72 hours. Thyroid  Function Tests: No results for input(s): TSH, T4TOTAL, FREET4, T3FREE, THYROIDAB in the last 72 hours. Anemia Panel: No results for input(s): VITAMINB12, FOLATE, FERRITIN, TIBC, IRON , RETICCTPCT in the last 72 hours. Sepsis Labs: No results for input(s): PROCALCITON, LATICACIDVEN in the last 168 hours.  No results found for this or any previous visit (from the past 240 hours).   Radiology Studies: No results found.  Scheduled Meds:  apixaban   5 mg Oral BID   aspirin  EC  81 mg Oral Daily   atorvastatin   40 mg Oral Daily   Chlorhexidine  Gluconate Cloth  6 each Topical Daily   collagenase   1 Application Topical Daily   feeding supplement (GLUCERNA SHAKE)  237 mL Oral  TID BM   finasteride   5 mg Oral Daily   insulin  aspart  0-5 Units Subcutaneous QHS   insulin  aspart  0-9 Units Subcutaneous TID WC   megestrol   40 mg Oral Daily   mirtazapine   15 mg Oral QHS   multivitamin with minerals  1 tablet Oral Daily   nutrition supplement (JUVEN)  1 packet Oral BID BM   PARoxetine   20 mg Oral Daily   polyethylene glycol powder  119 g Oral Once   QUEtiapine   25 mg Oral QHS   Continuous Infusions:  sodium chloride  irrigation       LOS: 36 days   Time spent: 36 minutes  Casimer Dare, MD  [facility]  12/19/2023, 3:15 PM

## 2023-12-19 NOTE — TOC Progression Note (Signed)
 Transition of Care [facility]) - Progression Note    Patient Details  Name: [name] [name] MRN: [id number] Date of Birth: 1955/12/21  Transition of [facility]) CM/SW Contact  [name], LCSW Phone Number: 12/19/2023, 3:53 PM  Clinical Narrative:   Several messages with Brittany/[place] throughout the day: they are needing confirmation from the DSS medicaid CSW that pt is likely eligible for LTC medicaid.  CSW was able to speak with [facility]/DSS medicaid several times: she will need financial records from pt [name] to determine eligibility.  This could easily take the entire 45 day period.  She does have pt [name] listed, so she will reach out to him for the needed information.  She is unable to predict if pt will ultimately be eligible.    CSW spoke with Brittany again, as pt was referred as LOG patient for the period of medicaid application.  Brittany stating pt was accepted as medicaid pending pt, which is different than LOG pt.  Discussed that pt has been waiting for significant time in the hospital.  She will discuss with her leadership.   1530: Message from Brittany: her leadership is wanting to wait for a semi private male bed to open up, probably next week.    CSW reached out to [facility] leadership/Jesse to discuss next steps.    Expected Discharge Plan: Skilled Nursing Facility Barriers to Discharge: Continued Medical Work up               Expected Discharge Plan and Services In-house Referral: Clinical Social Work   Post Acute Care Choice: Skilled Nursing Facility Living arrangements for the past 2 months: Single Family Home                                       Social Drivers of Health (SDOH) Interventions SDOH Screenings   Food Insecurity: No Food Insecurity (11/13/2023)  Recent Concern: Food Insecurity - Food Insecurity Present (08/18/2023)  Housing: Low Risk (11/13/2023)  Transportation Needs: No Transportation Needs (11/13/2023)   Utilities: Not At Risk (11/13/2023)  Alcohol Screen: Low Risk (08/19/2023)  Depression (PHQ2-9): Medium Risk (07/26/2023)  Financial Resource Strain: Low Risk (08/19/2023)  Physical Activity: Inactive (11/21/2022)  Social Connections: Socially Isolated (11/13/2023)  Stress: Stress Concern Present (11/21/2022)  Tobacco Use: High Risk (11/24/2023)  Health Literacy: Adequate Health Literacy (11/21/2022)    Readmission Risk Interventions    08/20/2023    4:20 PM  Readmission Risk Prevention Plan  Transportation Screening Complete  PCP or Specialist Appt within 3-5 Days Complete  HRI or Home Care Consult Complete  Palliative Care Screening Not Applicable  Medication Review (RN Care Manager) Complete

## 2023-12-19 NOTE — Plan of Care (Signed)

## 2023-12-19 NOTE — Plan of Care (Signed)
   Problem: Education: Goal: Knowledge of General Education information will improve Description: Including pain rating scale, medication(s)/side effects and non-pharmacologic comfort measures Outcome: Progressing   Problem: Activity: Goal: Risk for activity intolerance will decrease Outcome: Progressing   Problem: Nutrition: Goal: Adequate nutrition will be maintained Outcome: Progressing

## 2023-12-19 NOTE — Progress Notes (Signed)
 Physical Therapy Treatment Patient Details Name: [name] [name] MRN: [id number] DOB: 01-03-1955 Today's Date: 12/19/2023   History of Present Illness 68 y.o. male admitted 11/12/23 for abnormal renal labs. Found to have AKI and potential UTI. Foley catheter was inserted.  He has since been having hematuria, possibly trauma in the setting of Eliquis . Pt with worsening of chronic normocytic anemia. During workup, patient was Hemoccult positive, GI consulted. Hemoglobin continued to drift down requiring 1 unit PRBC via transfusion. CT imaging on admission was significant for right pleural effusion concerning for possible empyema. Pulmonology consulted for chest tube placement which was performed on 11/14 which was removed on 11/17.  Discharge complicated by unsafe discharge plan at this time. PMHx: PAF, chronic A-Fib, CHF, CAD s/p CABG, PAD, T2DM, HTN, HLD, chronic decubitus ulcer,  chronic anemia with heme positive stool, and chronic diarrhea. Of note, recent hospitalization 10/22-11/5 after fall sustaining L SDH with 4mm shift.    PT Comments  Pt progressing towards his physical therapy goals with lots of encouragement. Benefits from simple and direct commands. Pt requiring min-mod assist (+2 safety) for functional mobility. Used targets today to increase walking distance with chair follow. Pt ambulating 25 ft x2 with RW. Will continue to follow acutely.    If plan is discharge home, recommend the following: Assistance with cooking/housework;Assist for transportation;Help with stairs or ramp for entrance;Supervision due to cognitive status;Direct supervision/assist for medications management;A lot of help with bathing/dressing/bathroom;Direct supervision/assist for financial management;Two people to help with walking and/or transfers   Can travel by private vehicle     No  Equipment Recommendations  Wheelchair cushion (measurements PT);Wheelchair (measurements PT);Hospital bed;BSC/3in1     Recommendations for Other Services       Precautions / Restrictions Precautions Precautions: Fall Recall of Precautions/Restrictions: Impaired Precaution/Restrictions Comments: low vision (can't see out of L eye). HOH. hx TBI Restrictions Weight Bearing Restrictions Per Provider Order: No     Mobility  Bed Mobility Overal bed mobility: Needs Assistance Bed Mobility: Supine to Sit, Sit to Supine     Supine to sit: Mod assist Sit to supine: Contact guard assist   General bed mobility comments: Pt initiating bringing BLE's off edge of bed, use of bed pad to pivot hips and sit trunk upright. Pt able to return self to bed    Transfers Overall transfer level: Needs assistance Equipment used: Rolling walker (2 wheels) Transfers: Sit to/from Stand, Bed to chair/wheelchair/BSC Sit to Stand: Mod assist, From elevated surface, Min assist           General transfer comment: Min-modA to stand from edge of bed and chair    Ambulation/Gait Ambulation/Gait assistance: Min assist Gait Distance (Feet): 25 Feet (25, 25) Assistive device: Rolling walker (2 wheels) Gait Pattern/deviations: Step-through pattern, Decreased stride length       General Gait Details: MinA for balance, chair follow utilized   Optometrist     Tilt Bed    Modified Rankin (Stroke Patients Only)       Balance Overall balance assessment: Needs assistance Sitting-balance support: No upper extremity supported, Feet supported Sitting balance-Leahy Scale: Fair     Standing balance support: Bilateral upper extremity supported, During functional activity, Reliant on assistive device for balance Standing balance-Leahy Scale: Poor Standing balance comment: dependent on RW and external support  Communication Communication Communication: Impaired Factors Affecting Communication: Hearing impaired  Cognition Arousal:  Alert Behavior During Therapy: Flat affect   PT - Cognitive impairments: No family/caregiver present to determine baseline                       PT - Cognition Comments: Resistant to mobilize Following commands: Impaired Following commands impaired: Follows one step commands with increased time, Follows one step commands inconsistently    Cueing Cueing Techniques: Verbal cues, Visual cues, Tactile cues  Exercises      General Comments        Pertinent Vitals/Pain Pain Assessment Pain Assessment: Faces Faces Pain Scale: No hurt    Home Living                          Prior Function            PT Goals (current goals can now be found in the care plan section) Progress towards PT goals: Progressing toward goals    Frequency    Min 2X/week      PT Plan      Co-evaluation              AM-PAC PT 6 Clicks Mobility   Outcome Measure  Help needed turning from your back to your side while in a flat bed without using bedrails?: A Little Help needed moving from lying on your back to sitting on the side of a flat bed without using bedrails?: A Little Help needed moving to and from a bed to a chair (including a wheelchair)?: A Little Help needed standing up from a chair using your arms (e.g., wheelchair or bedside chair)?: A Lot Help needed to walk in hospital room?: A Little Help needed climbing 3-5 steps with a railing? : Total 6 Click Score: 15    End of Session Equipment Utilized During Treatment: Gait belt Activity Tolerance: Patient tolerated treatment well Patient left: in bed;with call bell/phone within reach;with bed alarm set Nurse Communication: Mobility status PT Visit Diagnosis: Unsteadiness on feet (R26.81);Other abnormalities of gait and mobility (R26.89);Muscle weakness (generalized) (M62.81);History of falling (Z91.81);Dizziness and giddiness (R42);Difficulty in walking, not elsewhere classified (R26.2)     Time:  9075-9055 PT Time Calculation (min) (ACUTE ONLY): 20 min  Charges:    $Therapeutic Activity: 8-22 mins PT General Charges $$ ACUTE PT VISIT: 1 Visit                     Aleck Daring, PT, DPT Acute Rehabilitation Services Office [phone]    [name] 12/19/2023, 12:13 PM

## 2023-12-20 DIAGNOSIS — R310 Gross hematuria: Secondary | ICD-10-CM | POA: Diagnosis not present

## 2023-12-20 LAB — GLUCOSE, CAPILLARY
Glucose-Capillary: 254 mg/dL — ABNORMAL HIGH (ref 70–99)
Glucose-Capillary: 137 mg/dL — ABNORMAL HIGH (ref 70–99)
Glucose-Capillary: 186 mg/dL — ABNORMAL HIGH (ref 70–99)
Glucose-Capillary: 159 mg/dL — ABNORMAL HIGH (ref 70–99)

## 2023-12-20 MED ORDER — LOSARTAN POTASSIUM 50 MG PO TABS
100.0000 mg | ORAL_TABLET | Freq: Every day | ORAL | Status: DC
  Administered 2023-12-21 – 2023-12-24 (×4): 100 mg via ORAL
  Filled 2023-12-20 (×4): qty 2

## 2023-12-20 MED ORDER — INSULIN GLARGINE 100 UNIT/ML ~~LOC~~ SOLN
8.0000 [IU] | Freq: Every day | SUBCUTANEOUS | Status: DC
  Administered 2023-12-20 – 2023-12-23 (×4): 8 [IU] via SUBCUTANEOUS
  Filled 2023-12-20 (×6): qty 0.08

## 2023-12-20 NOTE — Progress Notes (Addendum)
 " PROGRESS NOTE    [name] [name]  FMW:[id number] DOB: [birth year] DOA: 11/12/2023 PCP: [name] HERO, PA-C  Subjective: Patient reports feeling well, no acute complaints this morning     Hospital Course: [name] [name] is a 68 y.o. male with a history of atrial fibrillation with slow ventricular response, CAD, CABG x 4, aortic aneurysm s/p repair, CVA, diabetes mellitus type 2, heart failure with preserved EF. Patient presented secondary to abnormal metabolic panel and while in the emergency department found to have evidence of gross hematuria, anemia and AKI. Urology consulted. Patient started IV fluids. During workup, patient was Hemoccult positive, GI consulted. Hemoglobin continued to drift down requiring 1 unit PRBC via transfusion. CT imaging on admission was significant for right pleural effusion concerning for possible empyema. Pulmonology consulted for chest tube placement which was performed on 11/14 which was removed on 11/17.  Discharge complicated by unsafe discharge plan at this time.   Assessment and Plan:  Gross hematuria with possible urinary tract infection. Probably related to Foley catheter trauma and complicated by Eliquis  use. CT abdomen pelvis showed normal kidneys without hydronephrosis, nonobstructing stone versus vascular calcification of the upper pole left kidney.  Urology consulted and CBI started with resolution of hematuria.  Urine cultures growing 20,000 colonies of Staphylococcus hemolyticus. Patient also completed a course of antibiotics. Failed voiding trial once and Foley replaced. Can consider another voiding trial vs outpatient urology follow up  Continue finasteride     Acute blood loss anemia superimposed on chronic iron  deficiency anemia. Probably secondary to hematuria. S/p 1 unit of PRBC transfusion and Hb has been stable in the 8 range    AKI - resolved, Cr back to baseline    Heme positive stools In the setting of Eliquis  use.   GI consulted and patient underwent EGD showing nonerosive gastritis and multiple nonbleeding duodenal ulcers.  Colonoscopy deferred secondary to patient refusing the bowel prep. Bowel movements normal.     Paroxysmal atrial fibrillation Patient not on rate controlling medications secondary to slow ventricular response. Continue eliquis     Type 2 diabetes mellitus Continue with sliding scale insulin . Hemoglobin A1c of 8.3 - appetite improved, diet changed to diabetic    History of coronary artery disease status post CABG, PVD - continue aspirin , eliquis , atorvastatin      History of aortic aneurysm which has been stable.   Right pleural effusion Noted on CT imaging with evidence of moderate-sized collection.  Concern for possible empyema secondary to gas seen on imaging.  Last thoracentesis was from February 2025, which was also right-sided and was secondary to heart failure. Pulmonology consulted and placed a right-sided chest tube on 11/14 and have applied lytics for management of loculated effusion. Chest tube successfully removed on 11/17. Antibiotics completed. Patient is on RA without any chest pain or sob.    Cognitive dysfunction Delirium Noted. History of severe delirium during prior hospitalization. Improved with adjustment of antipsychotic regimen. Decreased dose of Seroquel  to 25 mg, patient not more awake and alert  Intermittently refuses to get out of bed.    Hypertension:  - BP has been persistently elevated, will restart home med losartan  - add on home med amlodipine  as needed     DVT prophylaxis: Place and maintain sequential compression device Start: 11/20/23 1505 SCDs Start: 11/13/23 0733 apixaban  (ELIQUIS ) tablet 5 mg    Code Status: Full Code Disposition Plan: TBD Reason for continuing need for hospitalization: Med ready. Awaiting dispo   Objective: Vitals:   12/19/23  1942 12/20/23 0535 12/20/23 0722 12/20/23 0829  BP: (!) 159/72 (!) 176/80 (!) 180/73 (!)  145/72  Pulse: (!) 57 (!) 50 62 66  Resp:  16 16 18   Temp: 99.7 F (37.6 C) 98.9 F (37.2 C) 99.2 F (37.3 C) 98.4 F (36.9 C)  TempSrc:  Oral Oral Oral  SpO2: 98% 100% 99% 99%  Weight:      Height:        Intake/Output Summary (Last 24 hours) at 12/20/2023 1445 Last data filed at 12/20/2023 1000 Gross per 24 hour  Intake 240 ml  Output 1450 ml  Net -1210 ml   Filed Weights   12/15/23 0500 12/16/23 0600 12/17/23 0500  Weight: 61.4 kg 61.2 kg 61.4 kg    Examination:  Physical Exam Vitals and nursing note reviewed.  Constitutional:      General: He is not in acute distress. Cardiovascular:     Rate and Rhythm: Normal rate.  Pulmonary:     Effort: No respiratory distress.  Abdominal:     General: There is no distension.  Musculoskeletal:     Right lower leg: No edema.     Left lower leg: No edema.     Data Reviewed: I have personally reviewed following labs and imaging studies  CBC: Recent Labs  Lab 12/16/23 2006  WBC 5.4  NEUTROABS 4.0  HGB 8.4*  HCT 26.4*  MCV 93.0  PLT 217   Basic Metabolic Panel: Recent Labs  Lab 12/16/23 2006 12/18/23 1150  NA 136 137  K 4.7 4.5  CL 100 103  CO2 30 27  GLUCOSE 234* 189*  BUN 30* 32*  CREATININE 1.28* 0.81  CALCIUM  8.3* 8.5*   GFR: Estimated Creatinine Clearance: 73.7 mL/min (by C-G formula based on SCr of 0.81 mg/dL). Liver Function Tests: No results for input(s): AST, ALT, ALKPHOS, BILITOT, PROT, ALBUMIN in the last 168 hours. No results for input(s): LIPASE, AMYLASE in the last 168 hours. No results for input(s): AMMONIA in the last 168 hours. Coagulation Profile: No results for input(s): INR, PROTIME in the last 168 hours. Cardiac Enzymes: No results for input(s): CKTOTAL, CKMB, CKMBINDEX, TROPONINI in the last 168 hours. ProBNP, BNP (last 5 results) Recent Labs    02/01/23 1625 02/06/23 0255 03/01/23 1157 08/18/23 1610 08/26/23 1107 09/12/23 1200  PROBNP  --    --  399.0* 3,796.0*  --   --   BNP 457.8* 97.3  --   --  190.6* 184.1*   HbA1C: No results for input(s): HGBA1C in the last 72 hours. CBG: Recent Labs  Lab 12/19/23 1143 12/19/23 1632 12/19/23 2105 12/20/23 0607 12/20/23 1203  GLUCAP 222* 258* 184* 254* 137*   Lipid Profile: No results for input(s): CHOL, HDL, LDLCALC, TRIG, CHOLHDL, LDLDIRECT in the last 72 hours. Thyroid  Function Tests: No results for input(s): TSH, T4TOTAL, FREET4, T3FREE, THYROIDAB in the last 72 hours. Anemia Panel: No results for input(s): VITAMINB12, FOLATE, FERRITIN, TIBC, IRON , RETICCTPCT in the last 72 hours. Sepsis Labs: No results for input(s): PROCALCITON, LATICACIDVEN in the last 168 hours.  No results found for this or any previous visit (from the past 240 hours).   Radiology Studies: No results found.  Scheduled Meds:  apixaban   5 mg Oral BID   aspirin  EC  81 mg Oral Daily   atorvastatin   40 mg Oral Daily   Chlorhexidine  Gluconate Cloth  6 each Topical Daily   collagenase   1 Application Topical Daily   feeding supplement (GLUCERNA SHAKE)  237 mL Oral TID BM   finasteride   5 mg Oral Daily   insulin  aspart  0-5 Units Subcutaneous QHS   insulin  aspart  0-9 Units Subcutaneous TID WC   insulin  glargine  8 Units Subcutaneous QHS   losartan   50 mg Oral Daily   megestrol   40 mg Oral Daily   mirtazapine   15 mg Oral QHS   multivitamin with minerals  1 tablet Oral Daily   nutrition supplement (JUVEN)  1 packet Oral BID BM   PARoxetine   20 mg Oral Daily   polyethylene glycol powder  119 g Oral Once   QUEtiapine   25 mg Oral QHS   Continuous Infusions:  sodium chloride  irrigation       LOS: 37 days   Time spent: 36 minutes  Casimer Dare, MD  [facility]  12/20/2023, 2:45 PM   "

## 2023-12-20 NOTE — Inpatient Diabetes Management (Signed)
 Inpatient Diabetes Program Recommendations  AACE/ADA: New Consensus Statement on Inpatient Glycemic Control (2015)  Target Ranges:  Prepandial:   less than 140 mg/dL      Peak postprandial:   less than 180 mg/dL (1-2 hours)      Critically ill patients:  140 - 180 mg/dL   Lab Results  Component Value Date   GLUCAP 254 (H) 12/20/2023   HGBA1C 8.3 (H) 11/15/2023    Review of Glycemic Control  Latest Reference Range & Units 12/19/23 06:05 12/19/23 08:22 12/19/23 11:43 12/19/23 16:32 12/19/23 21:05 12/20/23 06:07  Glucose-Capillary 70 - 99 mg/dL 832 (H) 825 (H) 777 (H) 258 (H) 184 (H) 254 (H)  (H): Data is abnormally high  Outpatient Diabetes medications:  Glipizide  5 mg Qam, 2.5 mg Qpm Januvia  100 mg Daily    Current orders for Inpatient glycemic control: Novolog  0-9 units TID + 0-5 units at bedtime    Inpatient Diabetes Program Recommendations:    Consider adding low dose basal insulin , Lantus  8 units daily.    Thanks, Tinnie Minus, MSN, RNC-OB Diabetes Coordinator [phone] (8a-5p)

## 2023-12-20 NOTE — Plan of Care (Signed)
" °  Problem: Health Behavior/Discharge Planning: Goal: Ability to manage health-related needs will improve Outcome: Progressing   Problem: Nutrition: Goal: Adequate nutrition will be maintained Outcome: Progressing   Problem: Elimination: Goal: Will not experience complications related to bowel motility Outcome: Progressing   Problem: Elimination: Goal: Will not experience complications related to urinary retention Outcome: Progressing   Problem: Skin Integrity: Goal: Risk for impaired skin integrity will decrease Outcome: Progressing   "

## 2023-12-20 NOTE — Progress Notes (Signed)
 Mobility Specialist Progress Note:    12/20/23 1200  Mobility  Activity  (bed mobility)  Level of Assistance Minimal assist, patient does 75% or more (+2)  Activity Response Tolerated fair;Tolerated poorly  Mobility Referral Yes  Mobility visit 1 Mobility  Mobility Specialist Start Time (ACUTE ONLY) 1044  Mobility Specialist Stop Time (ACUTE ONLY) 1054  Mobility Specialist Time Calculation (min) (ACUTE ONLY) 10 min   Pt received in bed hesitant but agreeable to mobility. Required MinA +2 to get to EOB. Once legs were dangling EOB pt declined further mobility swinging legs back into bed. Despite max encouragement pt refused OOB mobility. Stating its my vacation day and I'm going home today. I will do much better at home. Despite max education unable to get pt OOB. Was able to perform a couple bed level exercises. Left in bed w/ call bell and personal belongings in reach. All needs met. Bed alarm on.   Thersia Minder Mobility Specialist  Please contact vis [employer] or  Rehab Office [phone]

## 2023-12-20 NOTE — Plan of Care (Signed)

## 2023-12-20 NOTE — TOC Progression Note (Signed)
 Transition of Care [facility]) - Progression Note    Patient Details  Name: [name] [name] MRN: [id number] Date of Birth: 27-Dec-1955  Transition of [facility] CM/SW Contact  [name], LCSW Phone Number: 12/20/2023, 1:53 PM  Clinical Narrative:    Case staffed with [facility] supervisor [name].  She will reach out to [facility] administrator.  1300: Message from [place] requesting referral be sent to [facility], which was done.    Expected Discharge Plan: Skilled Nursing Facility Barriers to Discharge: Continued Medical Work up               Expected Discharge Plan and Services In-house Referral: Clinical Social Work   Post Acute Care Choice: Skilled Nursing Facility Living arrangements for the past 2 months: Single Family Home                                       Social Drivers of Health (SDOH) Interventions SDOH Screenings   Food Insecurity: No Food Insecurity (11/13/2023)  Recent Concern: Food Insecurity - Food Insecurity Present (08/18/2023)  Housing: Low Risk (11/13/2023)  Transportation Needs: No Transportation Needs (11/13/2023)  Utilities: Not At Risk (11/13/2023)  Alcohol Screen: Low Risk (08/19/2023)  Depression (PHQ2-9): Medium Risk (07/26/2023)  Financial Resource Strain: Low Risk (08/19/2023)  Physical Activity: Inactive (11/21/2022)  Social Connections: Socially Isolated (11/13/2023)  Stress: Stress Concern Present (11/21/2022)  Tobacco Use: High Risk (11/24/2023)  Health Literacy: Adequate Health Literacy (11/21/2022)    Readmission Risk Interventions    08/20/2023    4:20 PM  Readmission Risk Prevention Plan  Transportation Screening Complete  PCP or Specialist Appt within 3-5 Days Complete  HRI or Home Care Consult Complete  Palliative Care Screening Not Applicable  Medication Review (RN Care Manager) Complete

## 2023-12-21 LAB — GLUCOSE, CAPILLARY
Glucose-Capillary: 120 mg/dL — ABNORMAL HIGH (ref 70–99)
Glucose-Capillary: 125 mg/dL — ABNORMAL HIGH (ref 70–99)
Glucose-Capillary: 172 mg/dL — ABNORMAL HIGH (ref 70–99)
Glucose-Capillary: 175 mg/dL — ABNORMAL HIGH (ref 70–99)

## 2023-12-21 MED ORDER — AMLODIPINE BESYLATE 10 MG PO TABS
10.0000 mg | ORAL_TABLET | Freq: Every morning | ORAL | Status: DC
  Administered 2023-12-22 – 2023-12-24 (×3): 10 mg via ORAL
  Filled 2023-12-21 (×3): qty 1

## 2023-12-21 NOTE — Progress Notes (Signed)
 " Progress Note   Patient: [name] [name] FMW:[id number] DOB: May 15, 1955 DOA: 11/12/2023     38 DOS: the patient was seen and examined on 12/21/2023   Brief hospital course: 68 y.o. male with a history of atrial fibrillation with slow ventricular response, CAD, CABG x 4, aortic aneurysm s/p repair, CVA, diabetes mellitus type 2, heart failure with preserved EF. Patient presented secondary to abnormal metabolic panel and while in the emergency department found to have evidence of gross hematuria, anemia and AKI. Urology consulted. Patient started IV fluids. During workup, patient was Hemoccult positive, GI consulted. Hemoglobin continued to drift down requiring 1 unit PRBC via transfusion. CT imaging on admission was significant for right pleural effusion concerning for possible empyema. Pulmonology consulted for chest tube placement which was performed on 11/14 which was removed on 11/17.  Discharge complicated by unsafe discharge plan at this time.   Assessment and Plan: Gross hematuria with possible urinary tract infection. Probably related to Foley catheter trauma and complicated by Eliquis  use. CT abdomen pelvis showed normal kidneys without hydronephrosis, nonobstructing stone versus vascular calcification of the upper pole left kidney.  Urology consulted and CBI started with resolution of hematuria.  Urine cultures growing 20,000 colonies of Staphylococcus hemolyticus. Patient also completed a course of antibiotics. Failed voiding trial once and Foley now continued.  -Per Urology on 11/19, recommend pt to discharge with foley cath and f/u closely in Urology clinic for further tx of outlet obstruction -Continue finasteride     Acute blood loss anemia superimposed on chronic iron  deficiency anemia. Probably secondary to hematuria. S/p 1 unit of PRBC transfusion -remains hemodynamically stable   AKI - resolved, Cr back to baseline as of 12.17   Heme positive stools In the setting of  Eliquis  use.  GI consulted and patient underwent EGD showing nonerosive gastritis and multiple nonbleeding duodenal ulcers.  Colonoscopy deferred secondary to patient refusing the bowel prep. Bowel movements normal.     Paroxysmal atrial fibrillation Patient not on rate controlling medications secondary to slow ventricular response. Continue eliquis     Type 2 diabetes mellitus Continue with sliding scale insulin . Hemoglobin A1c of 8.3 - Glycemic trends stable   History of coronary artery disease status post CABG, PVD - continue aspirin , eliquis , atorvastatin      History of aortic aneurysm which has been stable.   Right pleural effusion Noted on CT imaging with evidence of moderate-sized collection.  Concern for possible empyema secondary to gas seen on imaging.  Last thoracentesis was from February 2025, which was also right-sided and was secondary to heart failure. Pulmonology consulted and placed a right-sided chest tube on 11/14 and have applied lytics for management of loculated effusion. Chest tube successfully removed on 11/17. Antibiotics completed. Patient remains on RA without any chest pain or sob.    Cognitive dysfunction Delirium Noted. History of severe delirium during prior hospitalization. Improved with adjustment of antipsychotic regimen. Decreased dose of Seroquel  to 25 mg, patient not more awake and alert  Intermittently refuses to get out of bed.    Hypertension:  - cont on losartan  100mg  - BP noted to be labile, as high as 182/91 -Will resume norvasc  10mg  per home regimen      Subjective: Without complaints this AM when seen  Physical Exam: Vitals:   12/21/23 0350 12/21/23 0505 12/21/23 0851 12/21/23 1546  BP: (!) 182/91 (!) 173/65 (!) 146/74 (!) 143/78  Pulse: 67  (!) 53 66  Resp: 20  18 18   Temp: 98.4 F (  36.9 C)  98.8 F (37.1 C) 99 F (37.2 C)  TempSrc: Oral     SpO2: 100%  97% 97%  Weight:      Height:       General exam: Awake, laying in  bed, in nad Respiratory system: Normal respiratory effort, no wheezing Cardiovascular system: regular rate, s1, s2 Gastrointestinal system: Soft, nondistended, positive BS Central nervous system: no seizures or tremors Extremities: Perfused, no clubbing Skin: Normal skin turgor, no notable skin lesions seen Psychiatry: Mood normal // no visual hallucinations   Data Reviewed:  There are no new results to review at this time.  Family Communication: Pt in room, family not at bedside  Disposition: Status is: Inpatient Remains inpatient appropriate because: dispo issues  Planned Discharge Destination: Skilled nursing facility    Author: Garnette Pelt, MD 12/21/2023 4:02 PM  For on call review [email or web address].  "

## 2023-12-21 NOTE — Plan of Care (Signed)

## 2023-12-21 NOTE — Plan of Care (Signed)
   Problem: Education: Goal: Knowledge of General Education information will improve Description: Including pain rating scale, medication(s)/side effects and non-pharmacologic comfort measures Outcome: Progressing   Problem: Activity: Goal: Risk for activity intolerance will decrease Outcome: Progressing   Problem: Nutrition: Goal: Adequate nutrition will be maintained Outcome: Progressing

## 2023-12-22 DIAGNOSIS — R310 Gross hematuria: Secondary | ICD-10-CM | POA: Diagnosis not present

## 2023-12-22 LAB — GLUCOSE, CAPILLARY
Glucose-Capillary: 116 mg/dL — ABNORMAL HIGH (ref 70–99)
Glucose-Capillary: 123 mg/dL — ABNORMAL HIGH (ref 70–99)
Glucose-Capillary: 192 mg/dL — ABNORMAL HIGH (ref 70–99)
Glucose-Capillary: 185 mg/dL — ABNORMAL HIGH (ref 70–99)

## 2023-12-22 NOTE — Plan of Care (Signed)
   Problem: Education: Goal: Knowledge of General Education information will improve Description: Including pain rating scale, medication(s)/side effects and non-pharmacologic comfort measures Outcome: Progressing   Problem: Activity: Goal: Risk for activity intolerance will decrease Outcome: Progressing   Problem: Nutrition: Goal: Adequate nutrition will be maintained Outcome: Progressing

## 2023-12-22 NOTE — Plan of Care (Signed)
  Problem: Pain Managment: Goal: General experience of comfort will improve and/or be controlled Outcome: Progressing   Problem: Safety: Goal: Ability to remain free from injury will improve Outcome: Progressing

## 2023-12-22 NOTE — Progress Notes (Signed)
 " Progress Note   Patient: [name] [name] FMW:[id number] DOB: 23-Feb-1955 DOA: 11/12/2023     39 DOS: the patient was seen and examined on 12/22/2023   Brief hospital course: No notes on file  Assessment and Plan: Gross hematuria with possible urinary tract infection. Probably related to Foley catheter trauma and complicated by Eliquis  use. CT abdomen pelvis showed normal kidneys without hydronephrosis, nonobstructing stone versus vascular calcification of the upper pole left kidney.  Urology consulted and CBI started with resolution of hematuria.  Urine cultures growing 20,000 colonies of Staphylococcus hemolyticus. Patient also completed a course of antibiotics. Failed voiding trial once and Foley now continued.  -Per Urology on 11/19, recommend pt to discharge with foley cath and f/u closely in Urology clinic for further tx of outlet obstruction -Continue finasteride     Acute blood loss anemia superimposed on chronic iron  deficiency anemia. Probably secondary to hematuria. S/p 1 unit of PRBC transfusion -remains hemodynamically stable   AKI - resolved, Cr back to baseline as of 12.17   Heme positive stools In the setting of Eliquis  use.  GI consulted and patient underwent EGD showing nonerosive gastritis and multiple nonbleeding duodenal ulcers.  Colonoscopy deferred secondary to patient refusing the bowel prep. Bowel movements normal.     Paroxysmal atrial fibrillation Patient not on rate controlling medications secondary to slow ventricular response. Continue eliquis     Type 2 diabetes mellitus Continue with sliding scale insulin . Hemoglobin A1c of 8.3 - Glycemic trends stable   History of coronary artery disease status post CABG, PVD - continue aspirin , eliquis , atorvastatin      History of aortic aneurysm which has been stable.   Right pleural effusion Noted on CT imaging with evidence of moderate-sized collection.  Concern for possible empyema secondary to gas  seen on imaging.  Last thoracentesis was from February 2025, which was also right-sided and was secondary to heart failure. Pulmonology consulted and placed a right-sided chest tube on 11/14 and have applied lytics for management of loculated effusion. Chest tube successfully removed on 11/17. Antibiotics completed. Patient remains on RA without any chest pain or sob.    Cognitive dysfunction Delirium Noted. History of severe delirium during prior hospitalization. Improved with adjustment of antipsychotic regimen. Decreased dose of Seroquel  to 25 mg, patient not more awake and alert  Intermittently refuses to get out of bed.    Hypertension:  - cont on losartan  100mg  - BP noted to be labile, as high as 182/91 -Will resume norvasc  10mg  per home regimen      Subjective: No new complaints.   Physical Exam: Vitals:   12/21/23 2058 12/22/23 0557 12/22/23 1005 12/22/23 1642  BP: (!) 166/85 (!) 167/80 127/68 (!) 156/83  Pulse: 65 62 (!) 58 67  Resp: 19 15 17 17   Temp: 99.3 F (37.4 C) 98.9 F (37.2 C) 98.1 F (36.7 C) 98.3 F (36.8 C)  TempSrc:  Oral    SpO2: 97% 98% 100% 97%  Weight:      Height:       Exam:  Constitutional:  The patient is awake, alert, and oriented x 3. No acute distress. Respiratory:  No increased work of breathing. No wheezes, rales, or rhonchi No tactile fremitus Cardiovascular:  Regular rate and rhythm No murmurs, ectopy, or gallups. No lateral PMI. No thrills. Abdomen:  Abdomen is soft, non-tender, non-distended No hernias, masses, or organomegaly Normoactive bowel sounds.  Musculoskeletal:  No cyanosis, clubbing, or edema Skin:  No rashes, lesions, ulcers palpation of skin:  no induration or nodules Neurologic:  CN 2-12 intact Sensation all 4 extremities intact Psychiatric:  Mental status Mood, affect appropriate Orientation to person, place, time  judgment and insight appear intact   Data Reviewed:  There are no new results to  review at this time.  Family Communication: Pt in room, family not at bedside  Disposition: Status is: Inpatient Remains inpatient appropriate because: dispo issues  Planned Discharge Destination: Skilled nursing facility  Author: Srihith Aquilino, DO 12/22/2023 5:37 PM  For on call review [email or web address].  "

## 2023-12-23 DIAGNOSIS — R310 Gross hematuria: Secondary | ICD-10-CM | POA: Diagnosis not present

## 2023-12-23 LAB — BASIC METABOLIC PANEL WITH GFR
Anion gap: 6 (ref 5–15)
BUN: 36 mg/dL — ABNORMAL HIGH (ref 8–23)
CO2: 27 mmol/L (ref 22–32)
Calcium: 8.7 mg/dL — ABNORMAL LOW (ref 8.9–10.3)
Chloride: 102 mmol/L (ref 98–111)
Creatinine, Ser: 1.02 mg/dL (ref 0.61–1.24)
GFR, Estimated: >60 mL/min
Glucose, Bld: 169 mg/dL — ABNORMAL HIGH (ref 70–99)
Potassium: 5.1 mmol/L (ref 3.5–5.1)
Sodium: 136 mmol/L (ref 135–145)

## 2023-12-23 LAB — CBC WITH DIFFERENTIAL/PLATELET
Abs Immature Granulocytes: 0.03 K/uL (ref 0.00–0.07)
Basophils Absolute: 0 K/uL (ref 0.0–0.1)
Basophils Relative: 1 %
Eosinophils Absolute: 0.2 K/uL (ref 0.0–0.5)
Eosinophils Relative: 3 %
HCT: 27.9 % — ABNORMAL LOW (ref 39.0–52.0)
Hemoglobin: 8.8 g/dL — ABNORMAL LOW (ref 13.0–17.0)
Immature Granulocytes: 1 %
Lymphocytes Relative: 19 %
Lymphs Abs: 1 K/uL (ref 0.7–4.0)
MCH: 28.8 pg (ref 26.0–34.0)
MCHC: 31.5 g/dL (ref 30.0–36.0)
MCV: 91.2 fL (ref 80.0–100.0)
Monocytes Absolute: 0.5 K/uL (ref 0.1–1.0)
Monocytes Relative: 10 %
Neutro Abs: 3.5 K/uL (ref 1.7–7.7)
Neutrophils Relative %: 66 %
Platelets: 247 K/uL (ref 150–400)
RBC: 3.06 MIL/uL — ABNORMAL LOW (ref 4.22–5.81)
RDW: 16.5 % — ABNORMAL HIGH (ref 11.5–15.5)
WBC: 5.3 K/uL (ref 4.0–10.5)
nRBC: 0 % (ref 0.0–0.2)

## 2023-12-23 LAB — GLUCOSE, CAPILLARY
Glucose-Capillary: 132 mg/dL — ABNORMAL HIGH (ref 70–99)
Glucose-Capillary: 157 mg/dL — ABNORMAL HIGH (ref 70–99)
Glucose-Capillary: 160 mg/dL — ABNORMAL HIGH (ref 70–99)
Glucose-Capillary: 219 mg/dL — ABNORMAL HIGH (ref 70–99)
Glucose-Capillary: 193 mg/dL — ABNORMAL HIGH (ref 70–99)

## 2023-12-23 NOTE — Plan of Care (Signed)
  Problem: Pain Managment: Goal: General experience of comfort will improve and/or be controlled Outcome: Progressing   Problem: Safety: Goal: Ability to remain free from injury will improve Outcome: Progressing   Problem: Skin Integrity: Goal: Risk for impaired skin integrity will decrease Outcome: Progressing

## 2023-12-23 NOTE — Progress Notes (Signed)
 Mobility Specialist Progress Note:    12/23/23 1006  Mobility  Activity Refused and notified nurse if applicable   Pt refused mobility reason unspecified despite max encouragement. Will f/u as able.    Lavanda Pollack Mobility Specialist  Please contact via [employer] or  Rehab Office [phone]

## 2023-12-23 NOTE — Progress Notes (Addendum)
 Physical Therapy Treatment Patient Details Name: [name] [name] MRN: [id number] DOB: September 01, 1955 Today's Date: 12/23/2023   History of Present Illness 67 y.o. male admitted 11/12/23 for abnormal renal labs. Found to have AKI and potential UTI. Foley catheter was inserted.  He has since been having hematuria, possibly trauma in the setting of Eliquis . Pt with worsening of chronic normocytic anemia. During workup, patient was Hemoccult positive, GI consulted. Hemoglobin continued to drift down requiring 1 unit PRBC via transfusion. CT imaging on admission was significant for right pleural effusion concerning for possible empyema. Pulmonology consulted for chest tube placement which was performed on 11/14 which was removed on 11/17.  Discharge complicated by unsafe discharge plan at this time. PMHx: PAF, chronic A-Fib, CHF, CAD s/p CABG, PAD, T2DM, HTN, HLD, chronic decubitus ulcer,  chronic anemia with heme positive stool, and chronic diarrhea. Of note, recent hospitalization 10/22-11/5 after fall sustaining L SDH with 4mm shift.    PT Comments  Pt is confused and under the impression he will be going home soon. Pt requiring modA (+2 safety) for bed mobility and transfers to standing. Found to have large BM with soiled gown and linens. Spent extended period of time providing peri care and changing out bed linens. Pt with difficulty maintaining static standing, so returned to supine to complete clean up. He reports feeling frightened like he's going to die, and feels his BP is elevated. Pt asks therapist to never return again. He then apologizes for his behavior at end of session. Needs team to continue to encourage mobilization as much as possible.    If plan is discharge home, recommend the following: Assistance with cooking/housework;Assist for transportation;Help with stairs or ramp for entrance;Supervision due to cognitive status;Direct supervision/assist for medications management;A lot of help  with bathing/dressing/bathroom;Direct supervision/assist for financial management;Two people to help with walking and/or transfers   Can travel by private Theme [employer] cushion (measurements PT);Wheelchair (measurements PT);Hospital bed;BSC/3in1    Recommendations for Other Services       Precautions / Restrictions Precautions Precautions: Fall Recall of Precautions/Restrictions: Impaired Precaution/Restrictions Comments: low vision (can't see out of L eye). HOH. hx TBI Restrictions Weight Bearing Restrictions Per Provider Order: No     Mobility  Bed Mobility Overal bed mobility: Needs Assistance Bed Mobility: Supine to Sit, Sit to Supine     Supine to sit: Mod assist Sit to supine: Contact guard assist   General bed mobility comments: Decreased initiation, use of bed pad to pivot hips and sit trunk upright. Pt able to return self to bed    Transfers Overall transfer level: Needs assistance Equipment used: Rolling walker (2 wheels) Transfers: Sit to/from Stand Sit to Stand: Mod assist, +2 safety/equipment           General transfer comment: Difficulty with reciprocal scooting, modA to power up to stand, initial posterior lean    Ambulation/Gait                   Stairs             Wheelchair Mobility     Tilt Bed    Modified Rankin (Stroke Patients Only)       Balance Overall balance assessment: Needs assistance Sitting-balance support: Feet supported Sitting balance-Leahy Scale: Fair     Standing balance support: Bilateral upper extremity supported, During functional activity, Reliant on assistive device for balance Standing balance-Leahy Scale: Poor  Communication Communication Communication: Impaired Factors Affecting Communication: Hearing impaired  Cognition Arousal: Alert Behavior During Therapy: Flat affect   PT - Cognitive impairments: No  family/caregiver present to determine baseline                       PT - Cognition Comments: Resistant to mobilize Following commands: Impaired      Cueing Cueing Techniques: Verbal cues, Visual cues, Tactile cues  Exercises      General Comments        Pertinent Vitals/Pain Pain Assessment Pain Assessment: Faces Faces Pain Scale: No hurt    Home Living                          Prior Function            PT Goals (current goals can now be found in the care plan section) Progress towards PT goals: Not progressing toward goals - comment    Frequency    Min 2X/week      PT Plan      Co-evaluation              AM-PAC PT 6 Clicks Mobility   Outcome Measure  Help needed turning from your back to your side while in a flat bed without using bedrails?: A Little Help needed moving from lying on your back to sitting on the side of a flat bed without using bedrails?: A Little Help needed moving to and from a bed to a chair (including a wheelchair)?: A Little Help needed standing up from a chair using your arms (e.g., wheelchair or bedside chair)?: A Lot Help needed to walk in hospital room?: A Little Help needed climbing 3-5 steps with a railing? : Total 6 Click Score: 15    End of Session Equipment Utilized During Treatment: Gait belt Activity Tolerance: Patient tolerated treatment well Patient left: in bed;with call bell/phone within reach;with bed alarm set Nurse Communication: Mobility status PT Visit Diagnosis: Unsteadiness on feet (R26.81);Other abnormalities of gait and mobility (R26.89);Muscle weakness (generalized) (M62.81);History of falling (Z91.81);Dizziness and giddiness (R42);Difficulty in walking, not elsewhere classified (R26.2)     Time: 1133-1200 PT Time Calculation (min) (ACUTE ONLY): 27 min  Charges:    $Therapeutic Activity: 23-37 mins PT General Charges $$ ACUTE PT VISIT: 1 Visit                     Aleck Daring, PT, DPT Acute Rehabilitation Services Office [phone]    [name] 12/23/2023, 1:37 PM

## 2023-12-23 NOTE — Plan of Care (Signed)
" °  Problem: Education: Goal: Knowledge of General Education information will improve Description: Including pain rating scale, medication(s)/side effects and non-pharmacologic comfort measures 12/23/2023 0833 by Francia Tinnie BROCKS, RN Outcome: Progressing 12/23/2023 0833 by Francia Tinnie BROCKS, RN Outcome: Progressing   Problem: Activity: Goal: Risk for activity intolerance will decrease Outcome: Progressing   Problem: Pain Managment: Goal: General experience of comfort will improve and/or be controlled Outcome: Progressing   Problem: Safety: Goal: Ability to remain free from injury will improve Outcome: Progressing   Problem: Skin Integrity: Goal: Risk for impaired skin integrity will decrease Outcome: Progressing   "

## 2023-12-23 NOTE — Progress Notes (Signed)
 " Progress Note   Patient: [name] [name] FMW:[id number] DOB: August 08, 1955 DOA: 11/12/2023     40 DOS: the patient was seen and examined on 12/23/2023   Brief hospital course: 68 y.o. male with a history of atrial fibrillation with slow ventricular response, CAD, CABG x 4, aortic aneurysm s/p repair, CVA, diabetes mellitus type 2, heart failure with preserved EF. Patient presented secondary to abnormal metabolic panel and while in the emergency department found to have evidence of gross hematuria, anemia and AKI. Urology consulted. Patient started IV fluids. During workup, patient was Hemoccult positive, GI consulted. Hemoglobin continued to drift down requiring 1 unit PRBC via transfusion. CT imaging on admission was significant for right pleural effusion concerning for possible empyema. Pulmonology consulted for chest tube placement which was performed on 11/14 which was removed on 11/17.  Discharge complicated by unsafe discharge plan at this time.   Pt quite oppositional on 12/23/2023.   Assessment and Plan: Gross hematuria with possible urinary tract infection. Probably related to Foley catheter trauma and complicated by Eliquis  use. CT abdomen pelvis showed normal kidneys without hydronephrosis, nonobstructing stone versus vascular calcification of the upper pole left kidney.  Urology consulted and CBI started with resolution of hematuria.  Urine cultures growing 20,000 colonies of Staphylococcus hemolyticus. Patient also completed a course of antibiotics. Failed voiding trial once and Foley now continued.  -Per Urology on 11/19, recommend pt to discharge with foley cath and f/u closely in Urology clinic for further tx of outlet obstruction -Continue finasteride     Acute blood loss anemia superimposed on chronic iron  deficiency anemia. Probably secondary to hematuria. S/p 1 unit of PRBC transfusion -remains hemodynamically stable   AKI - resolved, Cr back to baseline as of 12.17    Heme positive stools In the setting of Eliquis  use.  GI consulted and patient underwent EGD showing nonerosive gastritis and multiple nonbleeding duodenal ulcers.  Colonoscopy deferred secondary to patient refusing the bowel prep. Bowel movements normal.     Paroxysmal atrial fibrillation Patient not on rate controlling medications secondary to slow ventricular response. Continue eliquis     Type 2 diabetes mellitus Continue with sliding scale insulin . Hemoglobin A1c of 8.3 - Glycemic trends stable   History of coronary artery disease status post CABG, PVD - continue aspirin , eliquis , atorvastatin      History of aortic aneurysm which has been stable.   Right pleural effusion Noted on CT imaging with evidence of moderate-sized collection.  Concern for possible empyema secondary to gas seen on imaging.  Last thoracentesis was from February 2025, which was also right-sided and was secondary to heart failure. Pulmonology consulted and placed a right-sided chest tube on 11/14 and have applied lytics for management of loculated effusion. Chest tube successfully removed on 11/17. Antibiotics completed. Patient remains on RA without any chest pain or sob.    Cognitive dysfunction Delirium Noted. History of severe delirium during prior hospitalization. Improved with adjustment of antipsychotic regimen. Decreased dose of Seroquel  to 25 mg, patient not more awake and alert  Intermittently refuses to get out of bed.    Hypertension:  - cont on losartan  100mg  - BP noted to be labile, as high as 182/91 -Will resume norvasc  10mg  per home regimen      Subjective: Pt uncooperative with exam.   Physical Exam: Vitals:   12/22/23 1642 12/22/23 2001 12/23/23 0527 12/23/23 0818  BP: (!) 156/83 (!) 149/74 (!) 165/82 123/72  Pulse: 67 66 66 66  Resp: 17 17 16  18  Temp: 98.3 F (36.8 C) 98.5 F (36.9 C) 98 F (36.7 C) 97.8 F (36.6 C)  TempSrc:  Oral Oral   SpO2: 97% 100% 100% 98%  Weight:       Height:       Exam:  Constitutional:  The patient is awake and alert. Not cooperative. Respiratory:  No increased work of breathing. No wheezes, rales, or rhonchi No tactile fremitus Cardiovascular:  Regular rate and rhythm No murmurs, ectopy, or gallups. No lateral PMI. No thrills. Abdomen:  Abdomen is soft, non-tender, non-distended No hernias, masses, or organomegaly Normoactive bowel sounds.  Musculoskeletal:  No cyanosis, clubbing, or edema Skin:  No rashes, lesions, ulcers palpation of skin: no induration or nodules Neurologic:  Unable/unwilling to cooperate with exam. Psychiatric:  Unable/unwilling to cooperate with exam.   Data Reviewed:  There are no new results to review at this time.  Family Communication:Unavailable  Disposition: Status is: Inpatient Remains inpatient appropriate because: dispo issues  Planned Discharge Destination: Skilled nursing facility  Author: Lakendrick Paradis, DO 12/23/2023 4:50 PM  For on call review [email or web address].  "

## 2023-12-24 ENCOUNTER — Ambulatory Visit: Admitting: Adult Health

## 2023-12-24 DIAGNOSIS — R310 Gross hematuria: Secondary | ICD-10-CM | POA: Diagnosis not present

## 2023-12-24 LAB — CBC WITH DIFFERENTIAL/PLATELET
Abs Immature Granulocytes: 0.01 K/uL (ref 0.00–0.07)
Basophils Absolute: 0 K/uL (ref 0.0–0.1)
Basophils Relative: 1 %
Eosinophils Absolute: 0.2 K/uL (ref 0.0–0.5)
Eosinophils Relative: 4 %
HCT: 26.9 % — ABNORMAL LOW (ref 39.0–52.0)
Hemoglobin: 8.6 g/dL — ABNORMAL LOW (ref 13.0–17.0)
Immature Granulocytes: 0 %
Lymphocytes Relative: 20 %
Lymphs Abs: 1 K/uL (ref 0.7–4.0)
MCH: 29.5 pg (ref 26.0–34.0)
MCHC: 32 g/dL (ref 30.0–36.0)
MCV: 92.1 fL (ref 80.0–100.0)
Monocytes Absolute: 0.5 K/uL (ref 0.1–1.0)
Monocytes Relative: 10 %
Neutro Abs: 3.4 K/uL (ref 1.7–7.7)
Neutrophils Relative %: 65 %
Platelets: 257 K/uL (ref 150–400)
RBC: 2.92 MIL/uL — ABNORMAL LOW (ref 4.22–5.81)
RDW: 16.5 % — ABNORMAL HIGH (ref 11.5–15.5)
WBC: 5.1 K/uL (ref 4.0–10.5)
nRBC: 0 % (ref 0.0–0.2)

## 2023-12-24 LAB — GLUCOSE, CAPILLARY
Glucose-Capillary: 111 mg/dL — ABNORMAL HIGH (ref 70–99)
Glucose-Capillary: 97 mg/dL (ref 70–99)

## 2023-12-24 LAB — BASIC METABOLIC PANEL WITH GFR
Anion gap: 5 (ref 5–15)
BUN: 39 mg/dL — ABNORMAL HIGH (ref 8–23)
CO2: 28 mmol/L (ref 22–32)
Calcium: 8.5 mg/dL — ABNORMAL LOW (ref 8.9–10.3)
Chloride: 103 mmol/L (ref 98–111)
Creatinine, Ser: 0.74 mg/dL (ref 0.61–1.24)
GFR, Estimated: >60 mL/min
Glucose, Bld: 139 mg/dL — ABNORMAL HIGH (ref 70–99)
Potassium: 5 mmol/L (ref 3.5–5.1)
Sodium: 136 mmol/L (ref 135–145)

## 2023-12-24 MED ORDER — INSULIN ASPART 100 UNIT/ML IJ SOLN: 0.0000 [IU] | Freq: Three times a day (TID) | INTRAMUSCULAR | Status: AC

## 2023-12-24 MED ORDER — MEGESTROL ACETATE 40 MG PO TABS: 40.0000 mg | ORAL_TABLET | Freq: Every day | ORAL | Status: AC

## 2023-12-24 MED ORDER — GLIPIZIDE 5 MG PO TABS: 2.5000 mg | ORAL_TABLET | Freq: Two times a day (BID) | ORAL | Status: AC

## 2023-12-24 MED ORDER — FINASTERIDE 5 MG PO TABS: 5.0000 mg | ORAL_TABLET | Freq: Every day | ORAL | Status: AC

## 2023-12-24 MED ORDER — ASPIRIN 81 MG PO TBEC: 81.0000 mg | DELAYED_RELEASE_TABLET | Freq: Every day | ORAL | Status: AC

## 2023-12-24 NOTE — Discharge Summary (Signed)
 " Physician Discharge Summary   Patient: [name] [name] MRN: [id number] DOB: 1955-06-01  Admit date:     11/12/2023  Discharge date: 12/24/2023  Discharge Physician: Deliliah Room   PCP: [name] HERO, PA-C   Recommendations at discharge:    F/u with your PCP in one week F/u outpatient with urology in one week. Keep foley catheter in until that time Continue taking meds as prescribed.  Discharge Diagnoses: Principal Problem:   Hematuria Active Problems:   Iron  deficiency anemia   Occult blood in stools   Anticoagulated   Pleural effusion on right   Empyema (HCC)   Pleural effusion   Heme positive stool   Duodenal ulcer   Hospital Course:  68 y.o. male with a history of atrial fibrillation with slow ventricular response, CAD, CABG x 4, aortic aneurysm s/p repair, CVA, diabetes mellitus type 2, heart failure with preserved EF. Patient presented secondary to abnormal metabolic panel and while in the emergency department found to have evidence of gross hematuria, anemia and AKI. Urology consulted. Patient started IV fluids. During workup, patient was Hemoccult positive, GI consulted. Hemoglobin continued to drift down requiring 1 unit PRBC via transfusion. CT imaging on admission was significant for right pleural effusion concerning for possible empyema. Pulmonology consulted for chest tube placement which was performed on 11/14 which was removed on 11/17.   Gross hematuria with possible urinary tract infection. Probably related to Foley catheter trauma and complicated by Eliquis  use. CT abdomen pelvis showed normal kidneys without hydronephrosis, nonobstructing stone versus vascular calcification of the upper pole left kidney.  Urology consulted and CBI started with resolution of hematuria.  Urine cultures growing 20,000 colonies of Staphylococcus hemolyticus. Patient also completed a course of antibiotics. Failed voiding trial once and Foley now continued.  -Per Urology on  11/19, recommend pt to discharge with foley cath and f/u closely in Urology clinic for further tx of outlet obstruction -Continue finasteride     Acute blood loss anemia superimposed on chronic iron  deficiency anemia. No acute bleeding on the discharge Probably secondary to hematuria. S/p 1 unit of PRBC transfusion    AKI - resolved, Cr back to baseline as of 12/17   Heme positive stools In the setting of Eliquis  use.  GI consulted and patient underwent EGD showing nonerosive gastritis and multiple nonbleeding duodenal ulcers.  Colonoscopy deferred secondary to patient refusing the bowel prep. Bowel movements normal.  Outpatient f/u with GI     Paroxysmal atrial fibrillation Patient not on rate controlling medications secondary to slow ventricular response. Continue eliquis     Type 2 diabetes mellitus Continue with sliding scale insulin  along with glipizide  2.5 mg PO BID. Hemoglobin A1c of 8.3    History of coronary artery disease status post CABG, PVD - continue aspirin , eliquis , atorvastatin      History of aortic aneurysm which has been stable.   Right pleural effusion Noted on CT imaging with evidence of moderate-sized collection.  Concern for possible empyema secondary to gas seen on imaging.  Last thoracentesis was from February 2025, which was also right-sided and was secondary to heart failure. Pulmonology consulted and placed a right-sided chest tube on 11/14 and have applied lytics for management of loculated effusion. Chest tube successfully removed on 11/17. Antibiotics completed. Patient remains on RA without any chest pain or sob.    Cognitive dysfunction Delirium Noted. History of severe delirium during prior hospitalization. Improved with adjustment of antipsychotic regimen. Decreased dose of Seroquel  to 25 mg, patient not more  awake and alert  Intermittently refuses to get out of bed.    Hypertension:  - cont on losartan  100mg  - BP noted to be labile, as high as  182/91 -Will resume norvasc  10mg  per home regimen  Disposition: [facility]     Pain control -   Controlled Substance Reporting System database was reviewed. and patient was instructed, not to drive, operate heavy machinery, perform activities at heights, swimming or participation in water  activities or provide baby-sitting services while on Pain, Sleep and Anxiety Medications; until their outpatient Physician has advised to do so again. Also recommended to not to take more than prescribed Pain, Sleep and Anxiety Medications.  Consultants: Pulm, Palliative, GI, urology, Psych Procedures performed: Chest tube placement with subsequent removal  Disposition: Skilled nursing facility Diet recommendation:  Cardiac and Carb modified diet DISCHARGE MEDICATION: Allergies as of 12/24/2023       Reactions   Other Hives, Itching   Wool fabric         Medication List     STOP taking these medications    liver oil-zinc  oxide 40 % ointment Commonly known as: DESITIN   zinc  oxide 20 % ointment       TAKE these medications    acetaminophen  500 MG tablet Commonly known as: TYLENOL  Take 500 mg by mouth every 6 (six) hours as needed for mild pain (pain score 1-3) or moderate pain (pain score 4-6).   amLODipine  10 MG tablet Commonly known as: NORVASC  Take 10 mg by mouth in the morning.   aspirin  EC 81 MG tablet Take 1 tablet (81 mg total) by mouth daily. Swallow whole. Start taking on: December 25, 2023   atorvastatin  40 MG tablet Commonly known as: LIPITOR Take 40 mg by mouth in the morning.   cyanocobalamin  1000 MCG tablet Commonly known as: VITAMIN B12 Take 1,000 mcg by mouth daily.   docusate sodium  100 MG capsule Commonly known as: COLACE Take 100 mg by mouth 2 (two) times daily.   Eliquis  5 MG Tabs tablet Generic drug: apixaban  Take 5 mg by mouth 2 (two) times daily.   ferrous sulfate 325 (65 FE) MG EC tablet Take 325 mg by mouth daily.    finasteride  5 MG tablet Commonly known as: PROSCAR  Take 1 tablet (5 mg total) by mouth daily. Start taking on: December 25, 2023   glipiZIDE  5 MG tablet Commonly known as: GLUCOTROL  Take 0.5 tablets (2.5 mg total) by mouth in the morning and at bedtime. What changed: how much to take   insulin  aspart 100 UNIT/ML injection Commonly known as: novoLOG  Inject 0-9 Units into the skin 3 (three) times daily with meals.   loratadine  10 MG tablet Commonly known as: CLARITIN  Take 10 mg by mouth daily as needed for allergies.   losartan  100 MG tablet Commonly known as: COZAAR  Take 100 mg by mouth in the morning.   megestrol  40 MG tablet Commonly known as: MEGACE  Take 1 tablet (40 mg total) by mouth daily. Start taking on: December 25, 2023   mirtazapine  15 MG tablet Commonly known as: REMERON  TAKE 1 TABLET BY MOUTH EVERYDAY AT BEDTIME   multivitamin with minerals tablet Take 1 tablet by mouth in the morning.   nutrition supplement (JUVEN) Pack Take 1 packet by mouth 2 (two) times daily between meals.   PARoxetine  20 MG tablet Commonly known as: PAXIL  Take 20 mg by mouth daily.   PRO-STAT SUGAR FREE PO Take 30 mLs by mouth in the morning and at  bedtime.   QUEtiapine  25 MG tablet Commonly known as: SEROQUEL  Take 25 mg by mouth at bedtime.   sitaGLIPtin  100 MG tablet Commonly known as: JANUVIA  Take 100 mg by mouth daily.   Vitamin D  50 MCG (2000 UT) Caps Take 4,000 Units by mouth daily.        Follow-up Information     [name], [name], PA-C. Schedule an appointment as soon as possible for a visit in 1 week(s).   Specialty: Physician Assistant Contact information: [address] [place] [address] [phone]         Delia Ole ORN, NP. Schedule an appointment as soon as possible for a visit in 1 week(s).   Specialty: Urology Contact information: [address] [place] [address] [phone]                Discharge  Exam: Fredricka Weights   12/15/23 0500 12/16/23 0600 12/17/23 0500  Weight: 61.4 kg 61.2 kg 61.4 kg   Constitutional: NAD, calm, comfortable Eyes: PERRL, lids and conjunctivae normal ENMT: Mucous membranes are moist. Posterior pharynx clear of any exudate or lesions.Normal dentition.  Neck: normal, supple, no masses, no thyromegaly Respiratory: clear to auscultation bilaterally, no wheezing, no crackles. Normal respiratory effort. No accessory muscle use.  Cardiovascular: Regular rate and rhythm, no murmurs / rubs / gallops. No extremity edema. 2+ pedal pulses. No carotid bruits.  Abdomen: no tenderness, no masses palpated. No hepatosplenomegaly. Bowel sounds positive.  Musculoskeletal: no clubbing / cyanosis. No joint deformity upper and lower extremities. Good ROM, no contractures. Normal muscle tone.  Skin: no rashes, lesions, ulcers. No induration Neurologic: CN 2-12 grossly intact. Sensation intact, DTR normal. Strength 5/5 x all 4 extremities.  Psychiatric: Normal judgment and insight. Alert and oriented x 3. Normal mood.    Condition at discharge: good  The results of significant diagnostics from this hospitalization (including imaging, microbiology, ancillary and laboratory) are listed below for reference.   Imaging Studies: No results found.  Microbiology: Results for orders placed or performed during the hospital encounter of 11/12/23  Urine Culture     Status: Abnormal   Collection Time: 11/13/23  1:10 AM   Specimen: Urine, Catheterized  Result Value Ref Range Status   Specimen Description URINE, CATHETERIZED  Final   Special Requests   Final    NONE Performed at [facility], [address], [place], [place] [address]    Culture 20,000 COLONIES/mL STAPHYLOCOCCUS HAEMOLYTICUS (A)  Final   Report Status 11/15/2023 FINAL  Final   Organism ID, Bacteria STAPHYLOCOCCUS HAEMOLYTICUS (A)  Final      Susceptibility   Staphylococcus haemolyticus - MIC*    CIPROFLOXACIN  >=8 RESISTANT Resistant     GENTAMICIN <=0.5 SENSITIVE Sensitive     NITROFURANTOIN <=16 SENSITIVE Sensitive     OXACILLIN >=4 RESISTANT Resistant     TETRACYCLINE <=1 SENSITIVE Sensitive     VANCOMYCIN  <=0.5 SENSITIVE Sensitive     TRIMETH /SULFA  <=10 SENSITIVE Sensitive     RIFAMPIN <=0.5 SENSITIVE Sensitive     Inducible Clindamycin POSITIVE Resistant     * 20,000 COLONIES/mL STAPHYLOCOCCUS HAEMOLYTICUS  MRSA Next Gen by PCR, Nasal     Status: None   Collection Time: 11/13/23  4:02 PM   Specimen: Nasal Mucosa; Nasal Swab  Result Value Ref Range Status   MRSA by PCR Next Gen NOT DETECTED NOT DETECTED Final    Comment: (NOTE) The GeneXpert MRSA Assay (FDA approved for NASAL specimens  only), is one component of a comprehensive MRSA colonization surveillance program. It is not intended to diagnose MRSA infection nor to guide or monitor treatment for MRSA infections. Test performance is not FDA approved in patients less than 63 years old. Performed at [facility], [address] [place], [place] [address]   Body fluid culture w Gram Stain     Status: None   Collection Time: 11/15/23  5:29 PM   Specimen: Pleural Fluid  Result Value Ref Range Status   Specimen Description PLEURAL  Final   Special Requests RIGHT  Final   Gram Stain NO WBC SEEN NO ORGANISMS SEEN   Final   Culture   Final    NO GROWTH 3 DAYS Performed at [facility], [address] [place], [place] [address]    Report Status 11/18/2023 FINAL  Final  Fungus Culture With Stain     Status: None   Collection Time: 11/15/23  5:30 PM   Specimen: Pleural, Right; Pleural Fluid  Result Value Ref Range Status   Fungus Stain Final report  Final   Fungus (Mycology) Culture Final report  Final    Comment: (NOTE) Performed At: [facility] [address] [place], [place] [id number] Jennette Shorter MD Ey:[id number]    Fungal Source RIGHT PLEURAL  Final    Comment: Performed at [facility], [address] [place], [place] [address]  Fungus Culture Result     Status: None   Collection Time: 11/15/23  5:30 PM  Result Value Ref Range Status   Result 1 Comment  Final    Comment: (NOTE) KOH/Calcofluor preparation:  no fungus observed. Performed At: [facility] [address] [place], [place] [id number] Jennette Shorter MD Ey:[id number]   Fungal organism reflex     Status: None   Collection Time: 11/15/23  5:30 PM  Result Value Ref Range Status   Fungal result 1 Comment  Final    Comment: (NOTE) No yeast or mold isolated after 4 weeks. Performed At: [facility] [address] [place], [place] [id number] Jennette Shorter MD Ey:[id number]     Labs: CBC: Recent Labs  Lab 12/23/23 0428 12/24/23 0403  WBC 5.3 5.1  NEUTROABS 3.5 3.4  HGB 8.8* 8.6*  HCT 27.9* 26.9*  MCV 91.2 92.1  PLT 247 257   Basic Metabolic Panel: Recent Labs  Lab 12/18/23 1150 12/23/23 0428 12/24/23 0403  NA 137 136 136  K 4.5 5.1 5.0  CL 103 102 103  CO2 27 27 28   GLUCOSE 189* 169* 139*  BUN 32* 36* 39*  CREATININE 0.81 1.02 0.74  CALCIUM  8.5* 8.7* 8.5*   Liver Function Tests: No results for input(s): AST, ALT, ALKPHOS, BILITOT, PROT, ALBUMIN in the last 168 hours. CBG: Recent Labs  Lab 12/23/23 0817 12/23/23 1313 12/23/23 1639 12/23/23 2113 12/24/23 0618  GLUCAP 157* 160* 219* 193* 111*    Discharge time spent: greater than 30 minutes.  Signed: Deliliah Room, MD [facility] 12/24/2023 "

## 2023-12-24 NOTE — TOC Transition Note (Signed)
 Transition of Care [facility]) - Discharge Note   Patient Details  Name: [name] [name] MRN: [id number] Date of Birth: October 23, 1955  [facility]) CM/SW Contact:  Lauraine FORBES Saa, LCSWA Phone Number: 12/24/2023, 12:15 PM   Clinical Narrative:     Patient will DC to: [facility] Anticipated DC date: 12/24/23 Family notified: Toua Stites; Son; [phone] Transport by: [facility]   Per MD patient ready for DC to [facility]. RN to call report prior to discharge [phone]). RN, patient (notified by MD), patient's family, and facility notified of DC. Discharge Summary and FL2 sent to facility. DC packet on chart. Ambulance transport requested for patient at 12:12.   CSW will sign off for now as social work intervention is no longer needed. Please consult us  again if new needs arise.    Final next level of care: Skilled Nursing Facility Barriers to Discharge: Barriers Resolved   Patient Goals and CMS Choice            Discharge Placement              Patient chooses bed at: Other - please specify in the comment section below: [facility]) Patient to be transferred to facility by: [facility] Name of family member notified: Kenden Brandt; Son; [phone] Patient and family notified of of transfer: 12/24/23  Discharge Plan and Services Additional resources added to the After Visit Summary for   In-house Referral: Clinical Social Work   Post Acute Care Choice: Skilled Nursing Facility                               Social Drivers of Health (SDOH) Interventions SDOH Screenings   Food Insecurity: No Food Insecurity (11/13/2023)  Recent Concern: Food Insecurity - Food Insecurity Present (08/18/2023)  Housing: Low Risk (11/13/2023)  Transportation Needs: No Transportation Needs (11/13/2023)  Utilities: Not At Risk (11/13/2023)  Alcohol Screen: Low Risk (08/19/2023)  Depression (PHQ2-9): Medium Risk (07/26/2023)  Financial Resource Strain: Low  Risk (08/19/2023)  Physical Activity: Inactive (11/21/2022)  Social Connections: Socially Isolated (11/13/2023)  Stress: Stress Concern Present (11/21/2022)  Tobacco Use: High Risk (11/24/2023)  Health Literacy: Adequate Health Literacy (11/21/2022)     Readmission Risk Interventions    08/20/2023    4:20 PM  Readmission Risk Prevention Plan  Transportation Screening Complete  PCP or Specialist Appt within 3-5 Days Complete  HRI or Home Care Consult Complete  Palliative Care Screening Not Applicable  Medication Review (RN Care Manager) Complete

## 2023-12-24 NOTE — Progress Notes (Signed)
 RN attempt to call report to [facility] receiving nurse x3. Each time the secretary would transfer this RN to the receiving [facility] nurse (@ 11:54p, 11:57p, and 1349). Each time, no answer. 4th attempt no one answered the main line.

## 2023-12-30 ENCOUNTER — Telehealth: Payer: Self-pay | Admitting: Physician Assistant

## 2023-12-30 NOTE — Telephone Encounter (Signed)
 Noted and agreed, thank you.

## 2023-12-30 NOTE — Telephone Encounter (Signed)
 Patient was discharged to [facility] from hospital.  Spoke with [name] (son) he had not had onboarding call yet.    I reached out to the facility.  They have confirmed they will be taking over primary care.  I have informed son.  I have also advised if patient does get discharged to call our office back to schedule appointment.

## 2024-01-14 ENCOUNTER — Other Ambulatory Visit: Payer: Self-pay | Admitting: Internal Medicine

## 2024-01-14 DIAGNOSIS — E1042 Type 1 diabetes mellitus with diabetic polyneuropathy: Secondary | ICD-10-CM

## 2024-02-03 ENCOUNTER — Ambulatory Visit: Admitting: Nurse Practitioner

## 2024-02-13 ENCOUNTER — Ambulatory Visit: Admitting: Nurse Practitioner

## 2024-08-03 ENCOUNTER — Ambulatory Visit: Admitting: Neurology
